# Patient Record
Sex: Female | Born: 1950 | Race: White | Hispanic: No | Smoking: Former smoker
Health system: Southern US, Community
[De-identification: ages and names within clinical notes are randomized; demographics above are authoritative.]

## PROBLEM LIST (undated history)

## (undated) DIAGNOSIS — K759 Inflammatory liver disease, unspecified: Secondary | ICD-10-CM

## (undated) DIAGNOSIS — Z8679 Personal history of other diseases of the circulatory system: Secondary | ICD-10-CM

## (undated) DIAGNOSIS — K7460 Unspecified cirrhosis of liver: Secondary | ICD-10-CM

## (undated) DIAGNOSIS — F329 Major depressive disorder, single episode, unspecified: Secondary | ICD-10-CM

## (undated) DIAGNOSIS — Z972 Presence of dental prosthetic device (complete) (partial): Secondary | ICD-10-CM

## (undated) DIAGNOSIS — IMO0002 Reserved for concepts with insufficient information to code with codable children: Secondary | ICD-10-CM

## (undated) DIAGNOSIS — G4700 Insomnia, unspecified: Secondary | ICD-10-CM

## (undated) DIAGNOSIS — D126 Benign neoplasm of colon, unspecified: Secondary | ICD-10-CM

## (undated) DIAGNOSIS — G988 Other disorders of nervous system: Secondary | ICD-10-CM

## (undated) DIAGNOSIS — E785 Hyperlipidemia, unspecified: Secondary | ICD-10-CM

## (undated) DIAGNOSIS — R109 Unspecified abdominal pain: Secondary | ICD-10-CM

## (undated) DIAGNOSIS — I10 Essential (primary) hypertension: Secondary | ICD-10-CM

## (undated) DIAGNOSIS — F319 Bipolar disorder, unspecified: Secondary | ICD-10-CM

## (undated) DIAGNOSIS — F419 Anxiety disorder, unspecified: Secondary | ICD-10-CM

## (undated) DIAGNOSIS — E119 Type 2 diabetes mellitus without complications: Secondary | ICD-10-CM

## (undated) DIAGNOSIS — J45909 Unspecified asthma, uncomplicated: Secondary | ICD-10-CM

## (undated) DIAGNOSIS — F32A Depression, unspecified: Secondary | ICD-10-CM

## (undated) DIAGNOSIS — R600 Localized edema: Secondary | ICD-10-CM

## (undated) DIAGNOSIS — G4733 Obstructive sleep apnea (adult) (pediatric): Secondary | ICD-10-CM

## (undated) DIAGNOSIS — D696 Thrombocytopenia, unspecified: Secondary | ICD-10-CM

## (undated) DIAGNOSIS — I7100 Dissection of unspecified site of aorta: Secondary | ICD-10-CM

## (undated) DIAGNOSIS — D649 Anemia, unspecified: Secondary | ICD-10-CM

## (undated) DIAGNOSIS — G2581 Restless legs syndrome: Secondary | ICD-10-CM

## (undated) DIAGNOSIS — Z5189 Encounter for other specified aftercare: Secondary | ICD-10-CM

## (undated) DIAGNOSIS — K589 Irritable bowel syndrome without diarrhea: Secondary | ICD-10-CM

## (undated) DIAGNOSIS — G4730 Sleep apnea, unspecified: Secondary | ICD-10-CM

## (undated) DIAGNOSIS — M1990 Unspecified osteoarthritis, unspecified site: Secondary | ICD-10-CM

## (undated) DIAGNOSIS — R0602 Shortness of breath: Secondary | ICD-10-CM

## (undated) DIAGNOSIS — K760 Fatty (change of) liver, not elsewhere classified: Secondary | ICD-10-CM

## (undated) DIAGNOSIS — G8929 Other chronic pain: Secondary | ICD-10-CM

## (undated) DIAGNOSIS — Z8673 Personal history of transient ischemic attack (TIA), and cerebral infarction without residual deficits: Secondary | ICD-10-CM

## (undated) DIAGNOSIS — G629 Polyneuropathy, unspecified: Secondary | ICD-10-CM

## (undated) DIAGNOSIS — I639 Cerebral infarction, unspecified: Secondary | ICD-10-CM

## (undated) DIAGNOSIS — K219 Gastro-esophageal reflux disease without esophagitis: Secondary | ICD-10-CM

## (undated) DIAGNOSIS — K635 Polyp of colon: Secondary | ICD-10-CM

## (undated) DIAGNOSIS — R32 Unspecified urinary incontinence: Secondary | ICD-10-CM

## (undated) DIAGNOSIS — J449 Chronic obstructive pulmonary disease, unspecified: Secondary | ICD-10-CM

## (undated) DIAGNOSIS — C801 Malignant (primary) neoplasm, unspecified: Secondary | ICD-10-CM

## (undated) HISTORY — DX: Fatty (change of) liver, not elsewhere classified: K76.0

## (undated) HISTORY — DX: Encounter for other specified aftercare: Z51.89

## (undated) HISTORY — PX: COLONOSCOPY: SHX174

## (undated) HISTORY — DX: Type 2 diabetes mellitus without complications: E11.9

## (undated) HISTORY — DX: Anxiety disorder, unspecified: F41.9

## (undated) HISTORY — PX: ABDOMINAL EXPLORATION SURGERY: SHX538

## (undated) HISTORY — DX: Other disorders of nervous system: G98.8

## (undated) HISTORY — DX: Polyp of colon: K63.5

## (undated) HISTORY — PX: ROTATOR CUFF REPAIR: SHX139

## (undated) HISTORY — PX: HEMORROIDECTOMY: SUR656

## (undated) HISTORY — PX: ABDOMINAL HYSTERECTOMY: SHX81

## (undated) HISTORY — DX: Essential (primary) hypertension: I10

## (undated) HISTORY — DX: Other chronic pain: G89.29

## (undated) HISTORY — DX: Chronic obstructive pulmonary disease, unspecified: J44.9

## (undated) HISTORY — PX: BACK SURGERY: SHX140

## (undated) HISTORY — DX: Cerebral infarction, unspecified: I63.9

## (undated) HISTORY — DX: Unspecified urinary incontinence: R32

## (undated) HISTORY — DX: Dissection of unspecified site of aorta: I71.00

## (undated) HISTORY — DX: Bipolar disorder, unspecified: F31.9

## (undated) HISTORY — DX: Depression, unspecified: F32.A

## (undated) HISTORY — DX: Thrombocytopenia, unspecified: D69.6

## (undated) HISTORY — DX: Personal history of other diseases of the circulatory system: Z86.79

## (undated) HISTORY — DX: Personal history of transient ischemic attack (TIA), and cerebral infarction without residual deficits: Z86.73

## (undated) HISTORY — PX: EYE SURGERY: SHX253

## (undated) HISTORY — PX: OTHER SURGICAL HISTORY: SHX169

## (undated) HISTORY — DX: Anemia, unspecified: D64.9

## (undated) HISTORY — DX: Unspecified abdominal pain: R10.9

## (undated) HISTORY — PX: CHOLECYSTECTOMY: SHX55

## (undated) HISTORY — DX: Hyperlipidemia, unspecified: E78.5

## (undated) HISTORY — DX: Obstructive sleep apnea (adult) (pediatric): G47.33

## (undated) HISTORY — DX: Major depressive disorder, single episode, unspecified: F32.9

## (undated) HISTORY — DX: Insomnia, unspecified: G47.00

## (undated) HISTORY — DX: Benign neoplasm of colon, unspecified: D12.6

## (undated) HISTORY — DX: Restless legs syndrome: G25.81

## (undated) HISTORY — DX: Unspecified cirrhosis of liver: K74.60

## (undated) HISTORY — PX: POSTERIOR CERVICAL FUSION/FORAMINOTOMY: SHX5038

## (undated) HISTORY — DX: Sleep apnea, unspecified: G47.30

## (undated) HISTORY — DX: Reserved for concepts with insufficient information to code with codable children: IMO0002

## (undated) HISTORY — DX: Unspecified asthma, uncomplicated: J45.909

---

## 1998-04-07 ENCOUNTER — Encounter: Payer: Self-pay | Admitting: Emergency Medicine

## 1998-04-07 ENCOUNTER — Emergency Department (HOSPITAL_COMMUNITY): Admission: EM | Admit: 1998-04-07 | Discharge: 1998-04-07 | Payer: Self-pay | Admitting: Emergency Medicine

## 1998-06-06 DIAGNOSIS — IMO0001 Reserved for inherently not codable concepts without codable children: Secondary | ICD-10-CM

## 1998-06-06 DIAGNOSIS — Z5189 Encounter for other specified aftercare: Secondary | ICD-10-CM

## 1998-06-06 HISTORY — DX: Reserved for inherently not codable concepts without codable children: IMO0001

## 1998-06-06 HISTORY — DX: Encounter for other specified aftercare: Z51.89

## 1999-05-23 ENCOUNTER — Inpatient Hospital Stay (HOSPITAL_COMMUNITY): Admission: EM | Admit: 1999-05-23 | Discharge: 1999-05-26 | Payer: Self-pay | Admitting: *Deleted

## 1999-05-27 ENCOUNTER — Other Ambulatory Visit: Admission: RE | Admit: 1999-05-27 | Discharge: 1999-06-09 | Payer: Self-pay

## 1999-06-25 ENCOUNTER — Inpatient Hospital Stay (HOSPITAL_COMMUNITY): Admission: AD | Admit: 1999-06-25 | Discharge: 1999-06-28 | Payer: Self-pay | Admitting: *Deleted

## 1999-06-29 ENCOUNTER — Other Ambulatory Visit: Admission: RE | Admit: 1999-06-29 | Discharge: 1999-07-05 | Payer: Self-pay

## 2000-04-02 ENCOUNTER — Inpatient Hospital Stay (HOSPITAL_COMMUNITY): Admission: EM | Admit: 2000-04-02 | Discharge: 2000-04-06 | Payer: Self-pay | Admitting: *Deleted

## 2004-09-24 ENCOUNTER — Other Ambulatory Visit: Payer: Self-pay

## 2004-09-24 ENCOUNTER — Emergency Department: Payer: Self-pay | Admitting: Internal Medicine

## 2004-09-27 ENCOUNTER — Ambulatory Visit: Payer: Self-pay | Admitting: Internal Medicine

## 2004-09-28 ENCOUNTER — Ambulatory Visit: Payer: Self-pay | Admitting: Internal Medicine

## 2004-10-07 ENCOUNTER — Emergency Department: Payer: Self-pay | Admitting: General Practice

## 2004-12-16 ENCOUNTER — Encounter: Payer: Self-pay | Admitting: Physical Medicine & Rehabilitation

## 2005-01-04 ENCOUNTER — Encounter: Payer: Self-pay | Admitting: Physical Medicine & Rehabilitation

## 2005-01-13 ENCOUNTER — Ambulatory Visit: Payer: Self-pay | Admitting: Internal Medicine

## 2005-02-11 ENCOUNTER — Ambulatory Visit: Payer: Self-pay | Admitting: Internal Medicine

## 2005-02-17 ENCOUNTER — Emergency Department: Payer: Self-pay | Admitting: Emergency Medicine

## 2005-02-18 ENCOUNTER — Ambulatory Visit: Payer: Self-pay | Admitting: Internal Medicine

## 2005-03-22 ENCOUNTER — Ambulatory Visit: Payer: Self-pay | Admitting: Internal Medicine

## 2005-05-04 ENCOUNTER — Ambulatory Visit: Payer: Self-pay | Admitting: Internal Medicine

## 2005-05-12 ENCOUNTER — Ambulatory Visit: Payer: Self-pay | Admitting: Emergency Medicine

## 2005-05-15 IMAGING — CT CT HEAD WITHOUT CONTRAST
1 series · 15 of 30 positions shown, 19 images · non-contrast
Comparison: none

REASON FOR EXAM: s/p fall [DATE] hit head with laceration now with h/a
problems walking and running
COMMENTS:

[Series 2: head injury 5.0 h40s · axial · 0.39mm/px · z∈[-160,-25]mm · 15 of 31 slices shown, 19 images]
[im 2/31  brain]
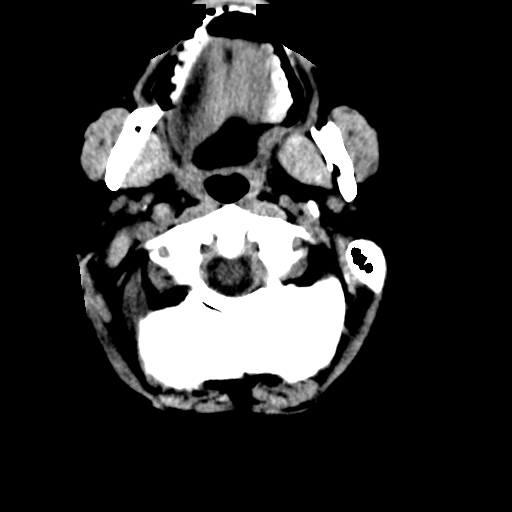
[im 2/31  bone]
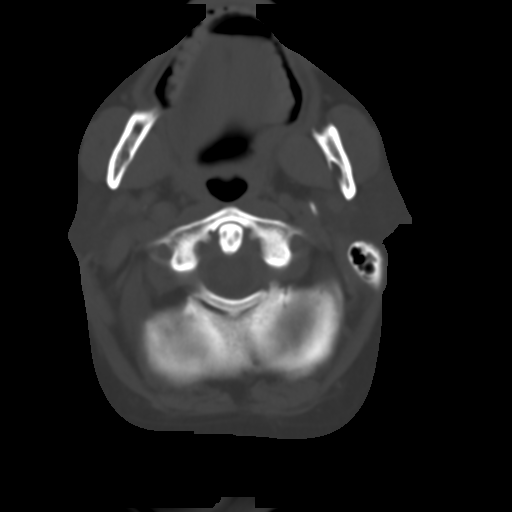
[im 4/31  brain]
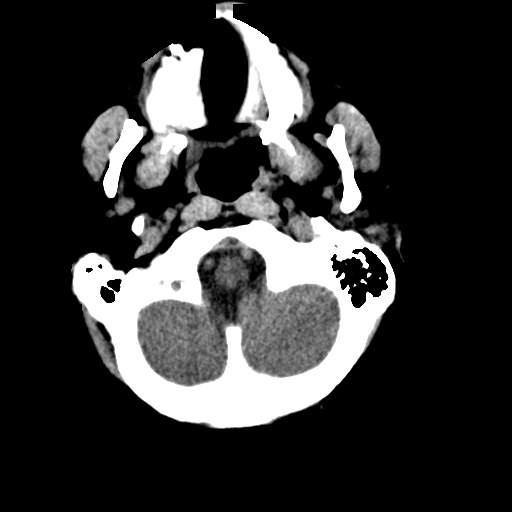
[im 6/31  brain]
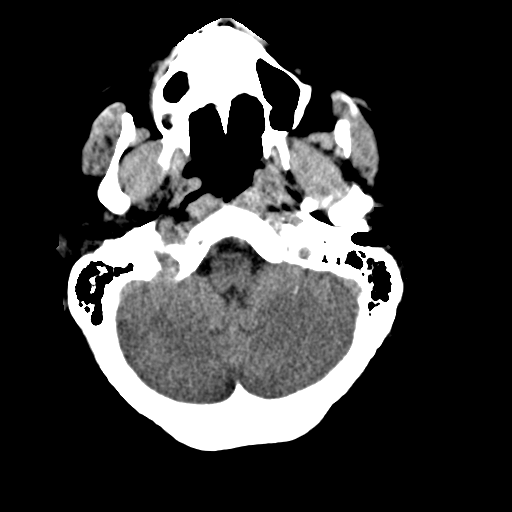
[im 8/31  brain]
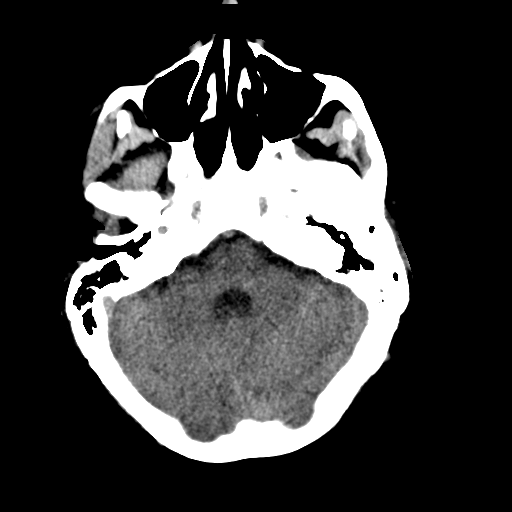
[im 10/31  brain]
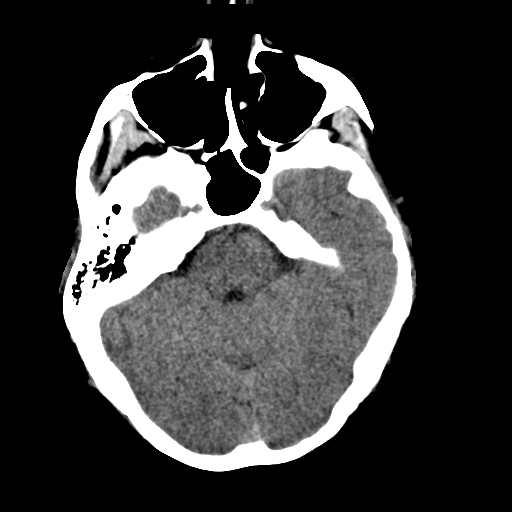
[im 10/31  bone]
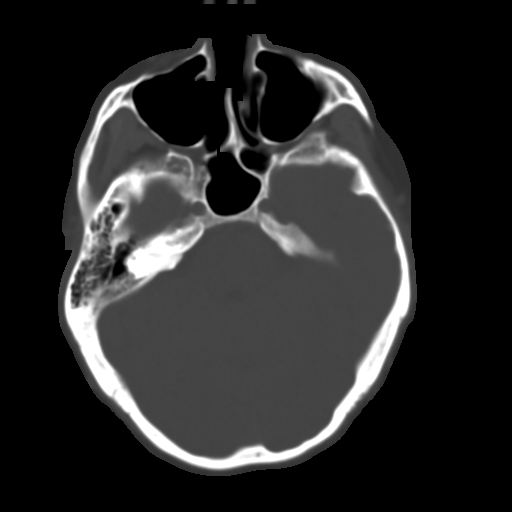
[im 12/31  brain]
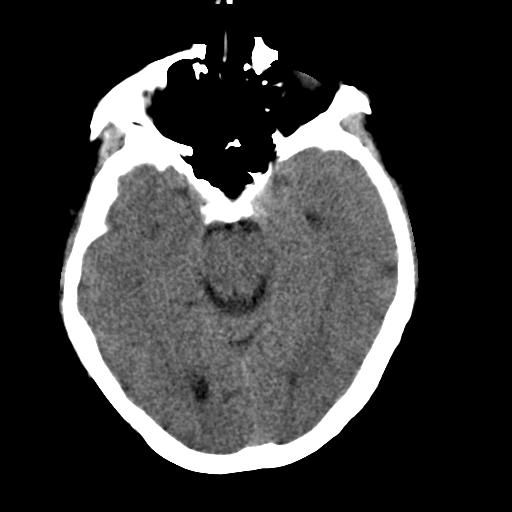
[im 14/31  brain]
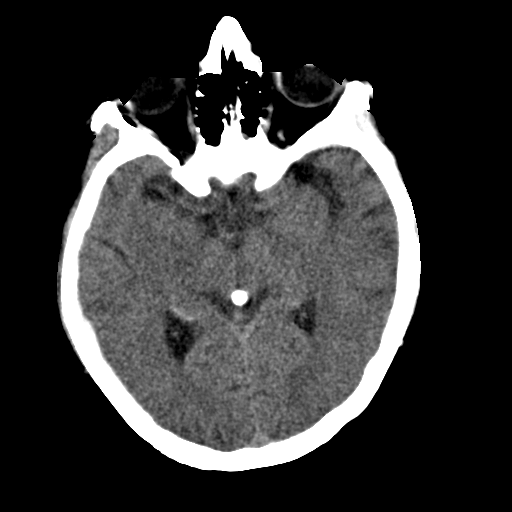
[im 16/31  brain]
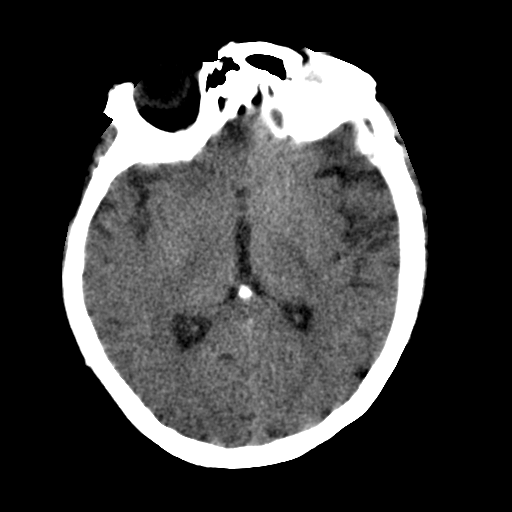
[im 17/31  brain]
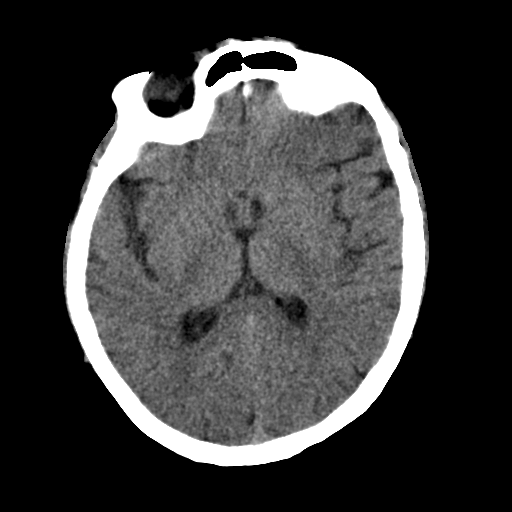
[im 17/31  bone]
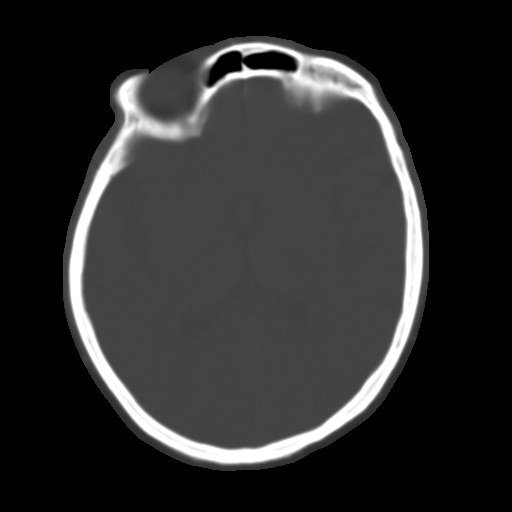
[im 19/31  brain]
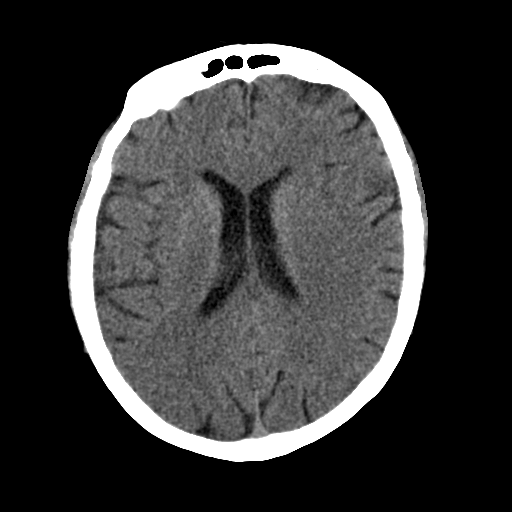
[im 21/31  brain]
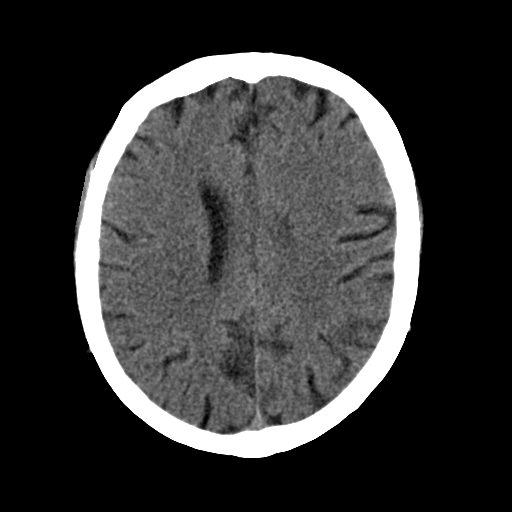
[im 23/31  brain]
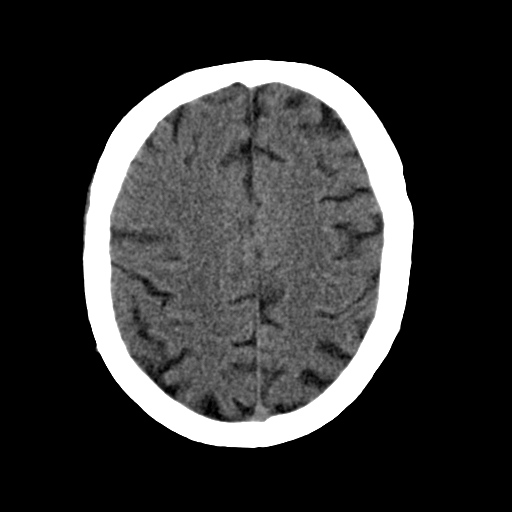
[im 25/31  brain]
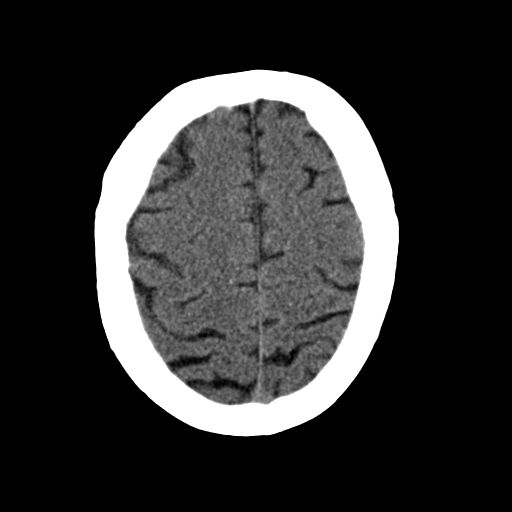
[im 25/31  bone]
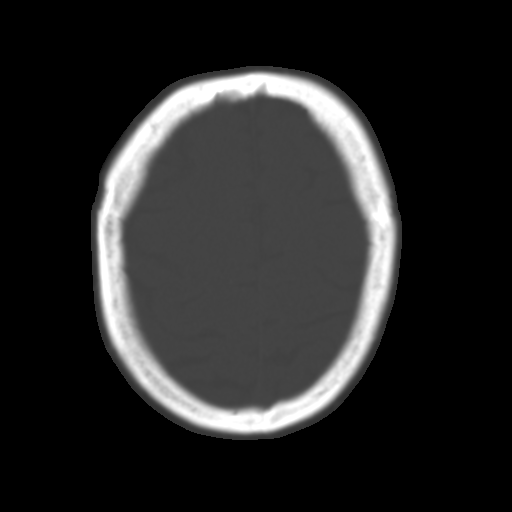
[im 27/31  brain]
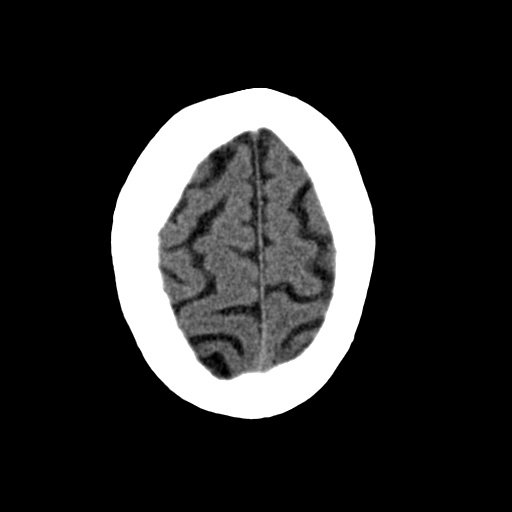
[im 29/31  brain]
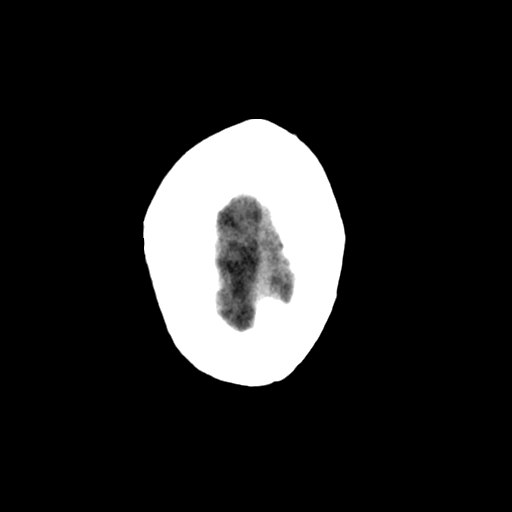

[15 of 30 positions shown; findings below may reference images not displayed]

PROCEDURE:     CT  - CT HEAD WITHOUT CONTRAST  - September 24, 2004 [DATE]

RESULT:         The patient has sustained a fall approximately one week ago
and now is experiencing gait difficulty.

The ventricles are normal in size and position.  There is no evidence of a
mass nor mass effect.  On image 13 there is a very subtle hypodensity in the
LEFT cerebral peduncle.   This is of questionable significance and is seen
on one image only.  There is no evidence of an intracranial hemorrhage or
evidence of mass effect or hydrocephalus.  At bone window settings,  I do
not see evidence of a skull fracture.  The visualized portions of the
paranasal sinuses exhibit no air-fluid levels.
IMPRESSION: 1.     I do not see objective evidence of an intracranial hemorrhage nor
other acute intracranial abnormality.
2.     On image 13 there is a very subtle area of decreased density in the
LEFT cerebral peduncle that merits further evaluation with MRI to ensure
that this is not an area of ischemia or another process.

The findings were called to Dr. Atitat in the Emergency Room at the
conclusion of the study.

## 2005-05-15 IMAGING — CT CT CERVICAL SPINE WITHOUT CONTRAST
3 of 4 series · 16 of 33 positions shown, 19 images · non-contrast
Comparison: none

REASON FOR EXAM: Fall,  pain
COMMENTS:

[Series 3: inspace · axial · 0.39mm/px · z∈[+93,+245]mm · 8 of 273 slices shown, 10 images]
[im 28/273  soft-tissue]
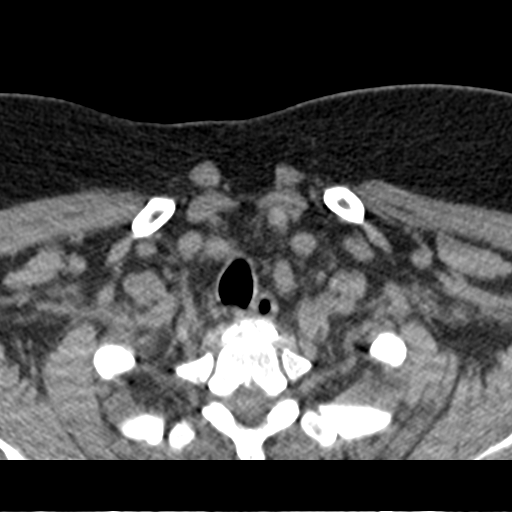
[im 28/273  bone]
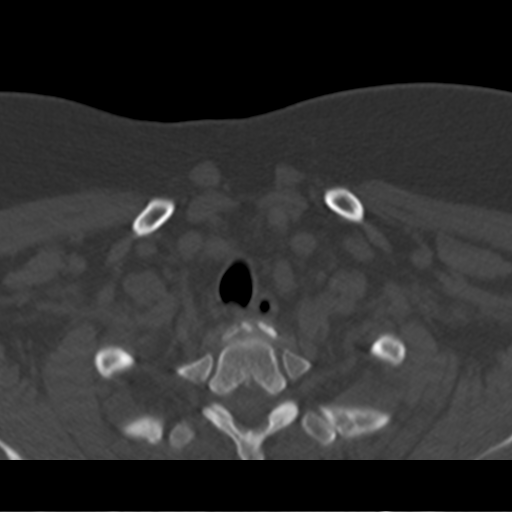
[im 55/273  bone]
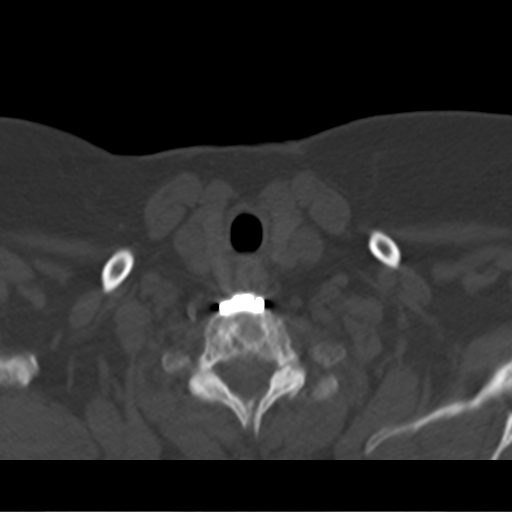
[im 82/273  bone]
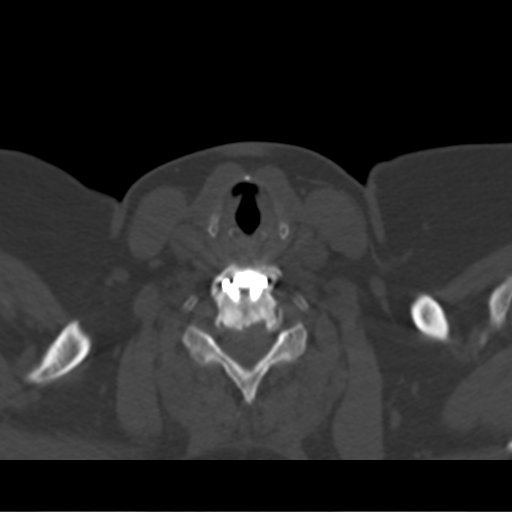
[im 109/273  bone]
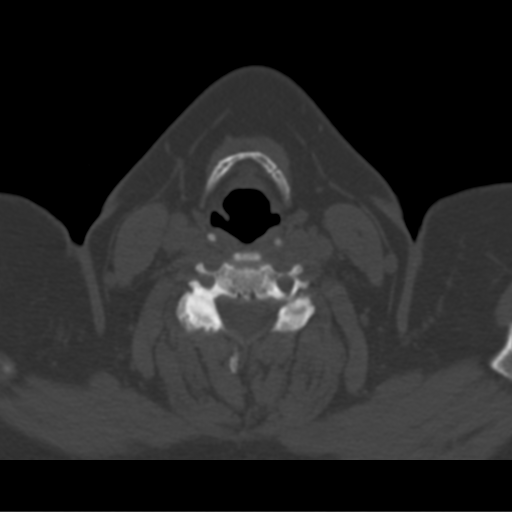
[im 164/273  soft-tissue]
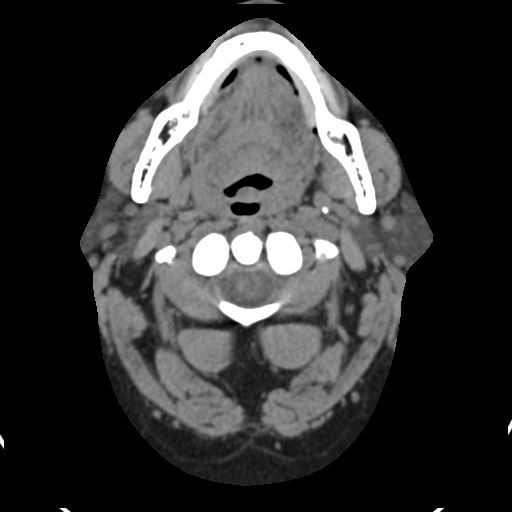
[im 164/273  bone]
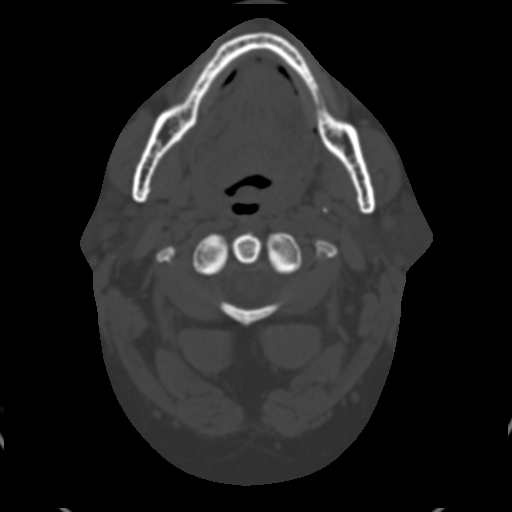
[im 191/273  bone]
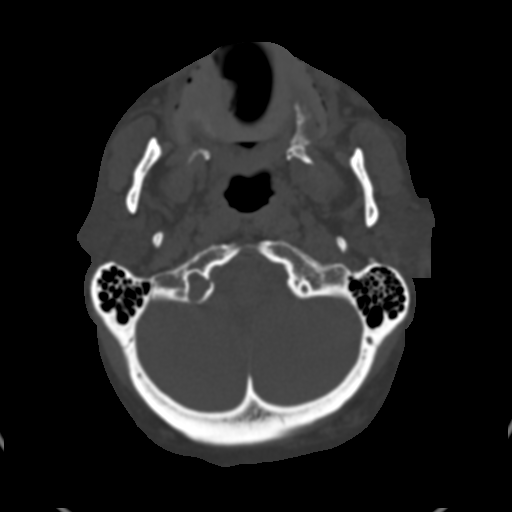
[im 218/273  bone]
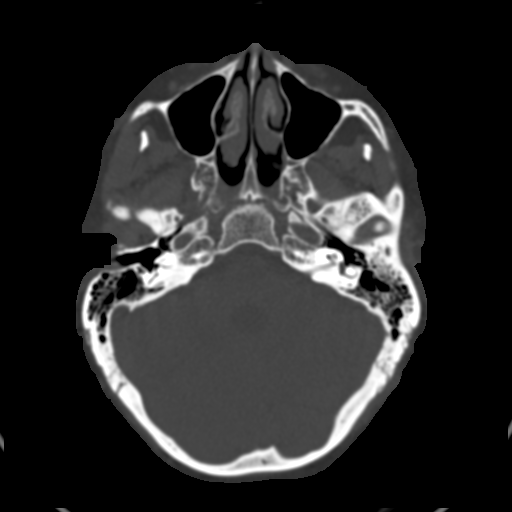
[im 245/273  bone]
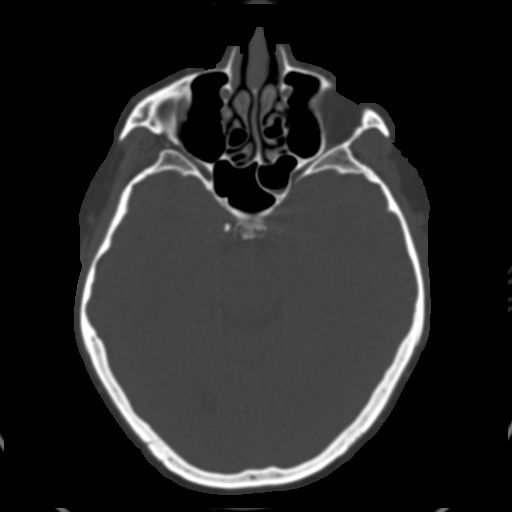

[Series 5: coronal · coronal · 0.33mm/px · 3 of 48 slices shown]
[im 10/48  bone]
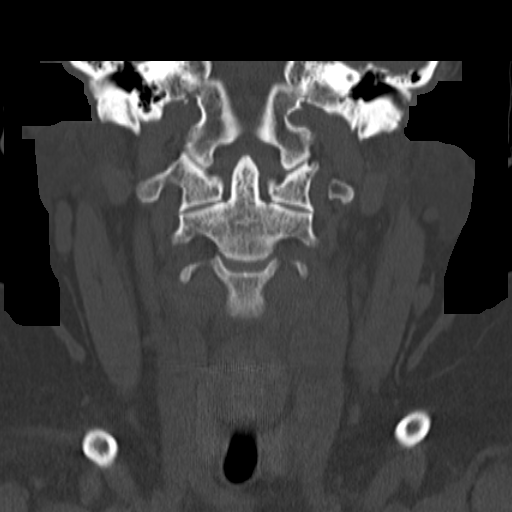
[im 19/48  bone]
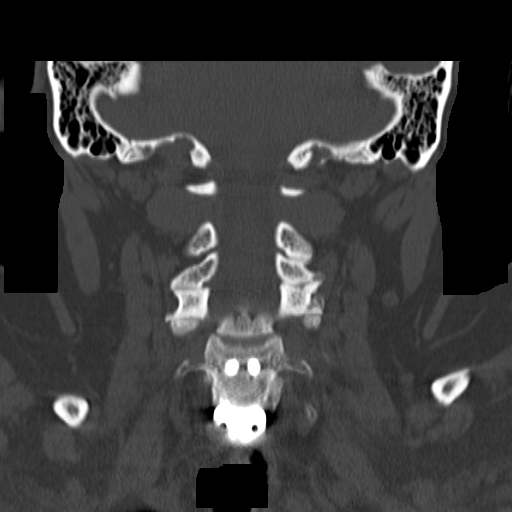
[im 29/48  bone]
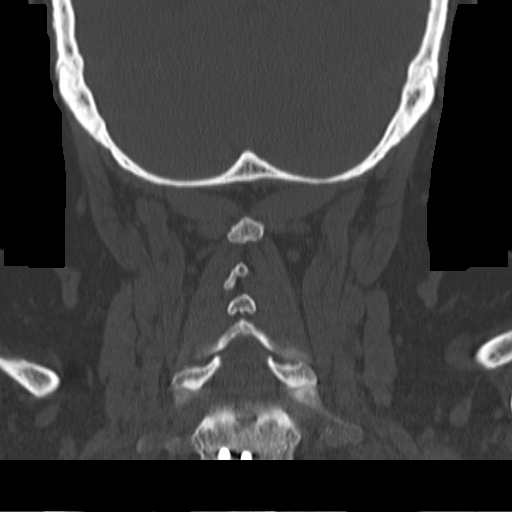

[Series 7: sagital · sagittal · 0.34mm/px · 5 of 36 slices shown, 6 images]
[im 12/36  bone]
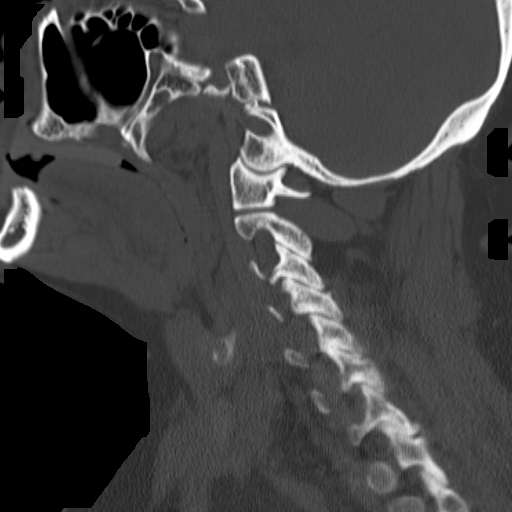
[im 15/36  bone]
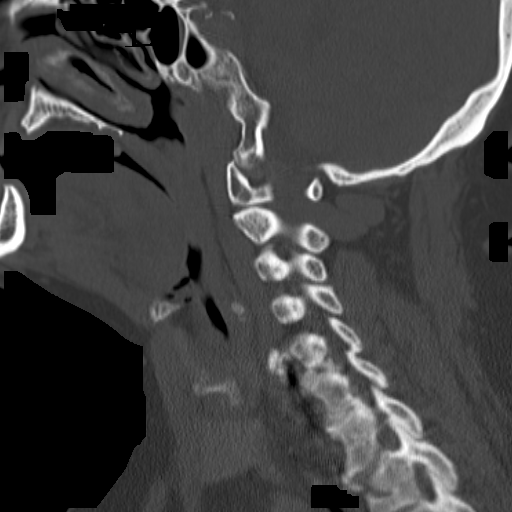
[im 18/36  soft-tissue]
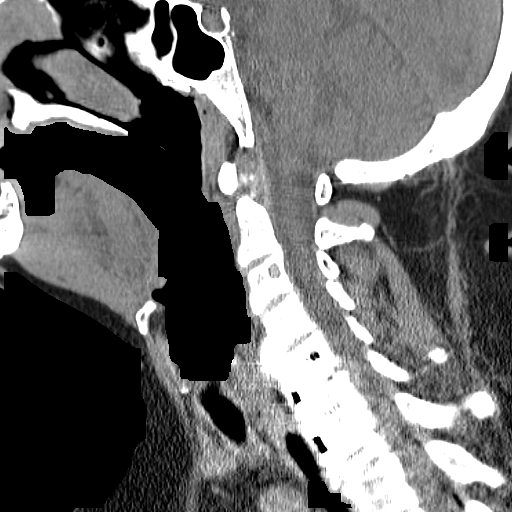
[im 18/36  bone]
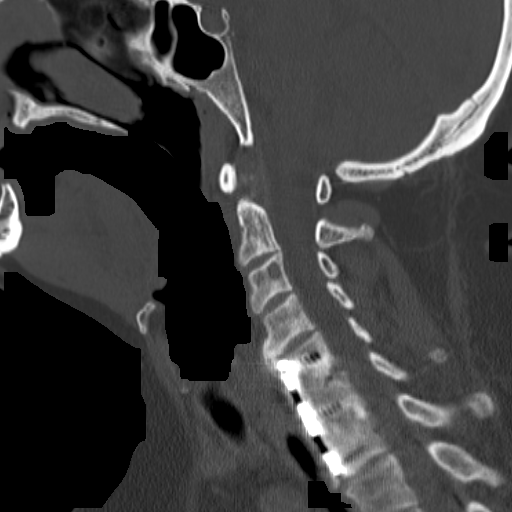
[im 21/36  bone]
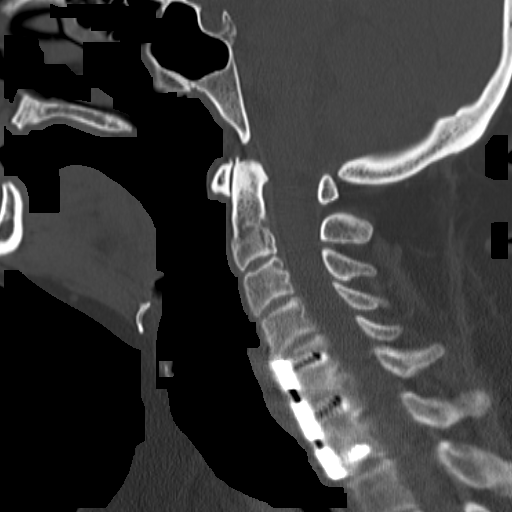
[im 24/36  bone]
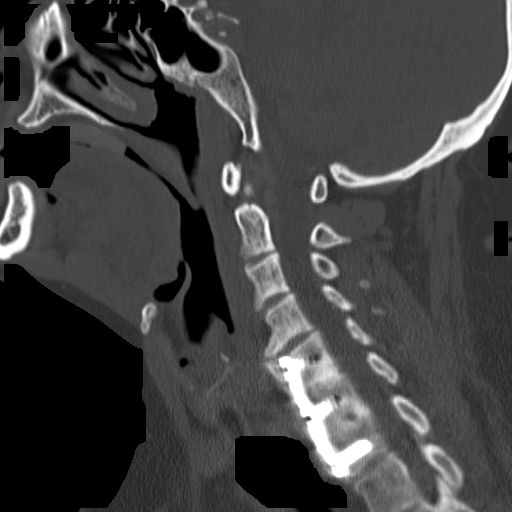

[16 of 33 positions shown; findings below may reference images not displayed]

PROCEDURE:     CT  - CT CERVICAL SPINE WO  - September 24, 2004  [DATE]

RESULT:     The patient has sustained a fall and is complaining of neck and
LEFT shoulder discomfort.

Axial, coronal and sagittal images were interpreted.

The odontoid is intact. The lateral masses of C1 articulate normally with
those of C2. The atlanto-axial joint space appears normal. The cervical
vertebral bodies appear preserved in height. The patient has undergone
anterior fusion across the C5-6 and C6-7 disc spaces. Fine detail of the
disc space is not available here. There is metallic artifact present. I do
not see objective evidence of a retropulsed bony fragment. There is noted to
be an end-plate osteophyte at the C4-5 disc level anteriorly. The posterior
elements appear grossly intact. I see no bony spinal canal compromise.
IMPRESSION: 1.     The patient has undergone prior fusion across the C5-6 and C6-7 disc
spaces.
2.     I do not see objective evidence of an acute cervical spine fracture
nor of high-grade spinal stenosis. There is end-plate spurring at the C4-5
level anteriorly.

## 2005-05-15 IMAGING — CR CERVICAL SPINE - COMPLETE 4+ VIEW
1 series · 6 of 6 positions shown · non-contrast
Comparison: none

REASON FOR EXAM: Fall on [DATE], pain.
COMMENTS:

[Series 1: view not recorded · 0.17mm/px · 6 of 6 slices shown]
[im 1/6]
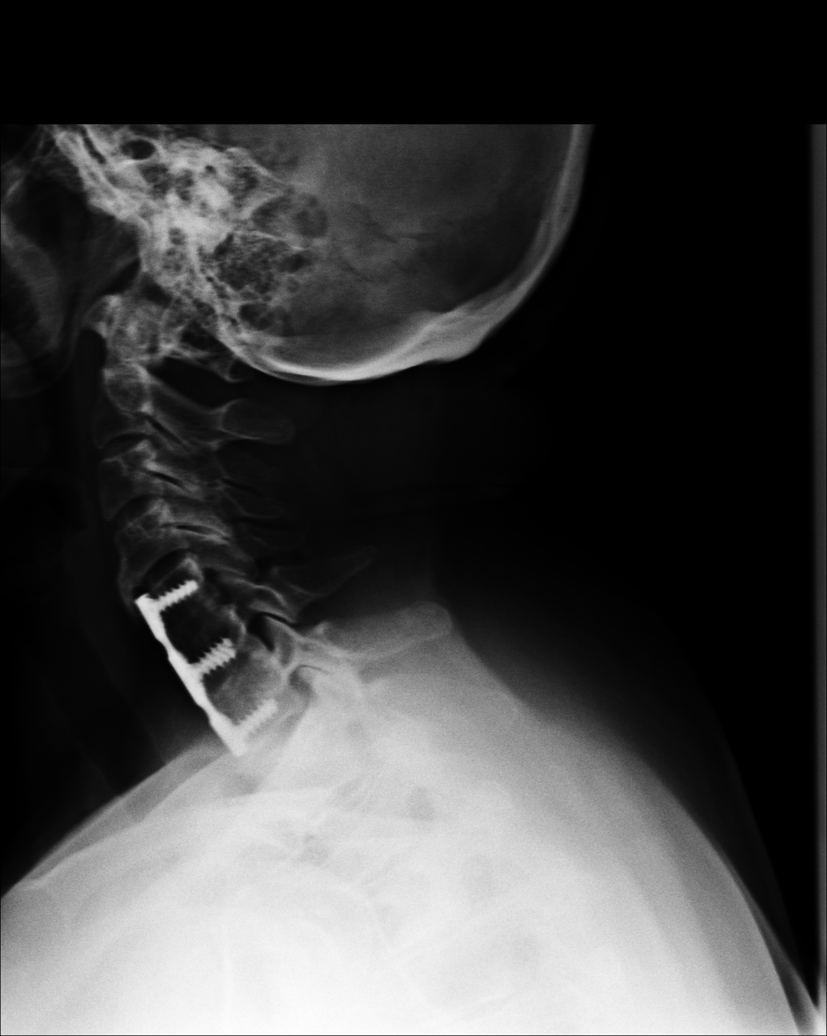
[im 2/6]
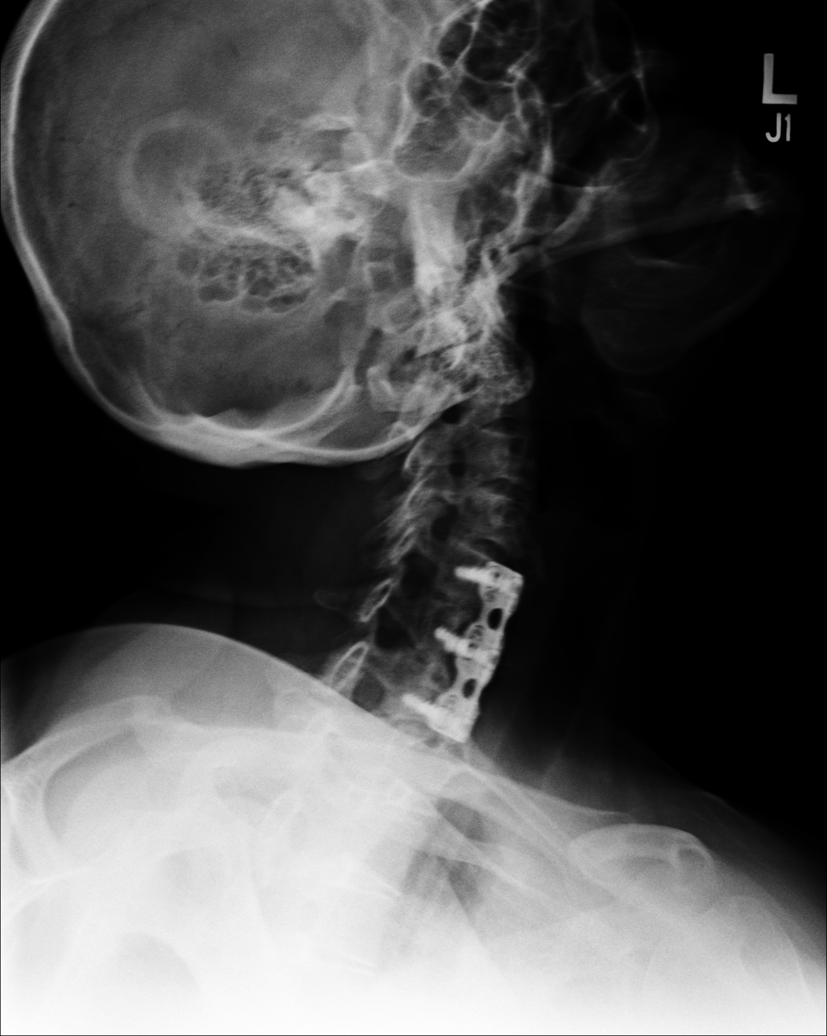
[im 3/6]
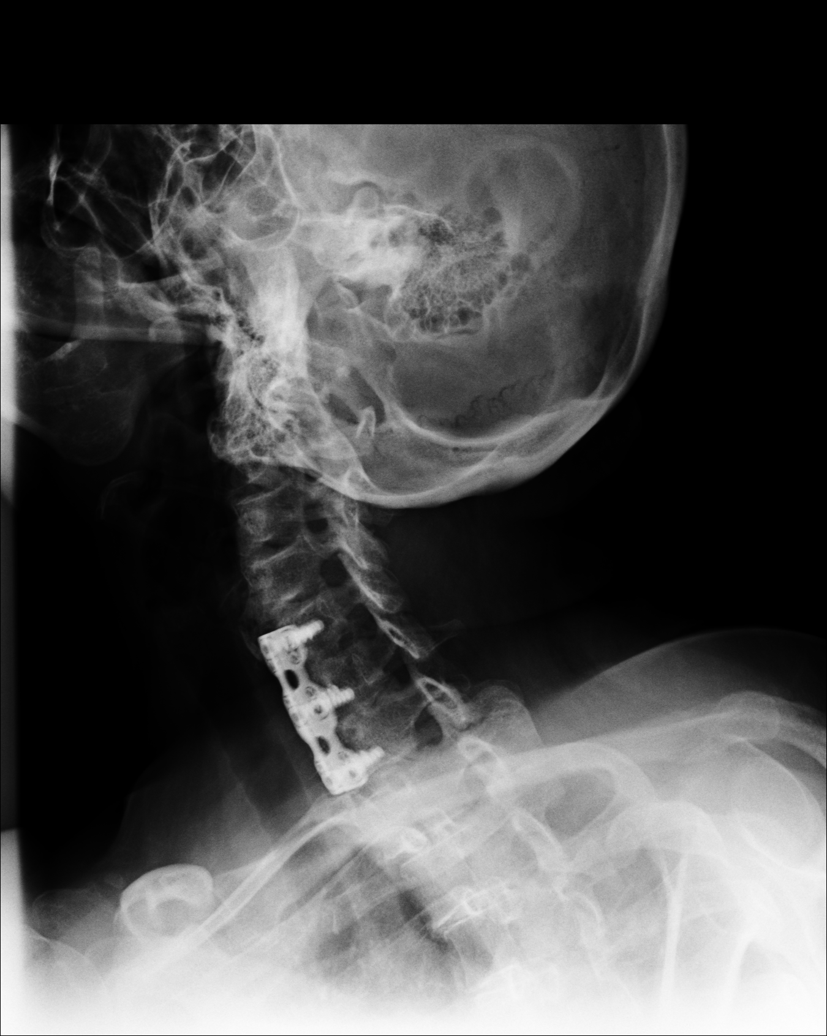
[im 4/6]
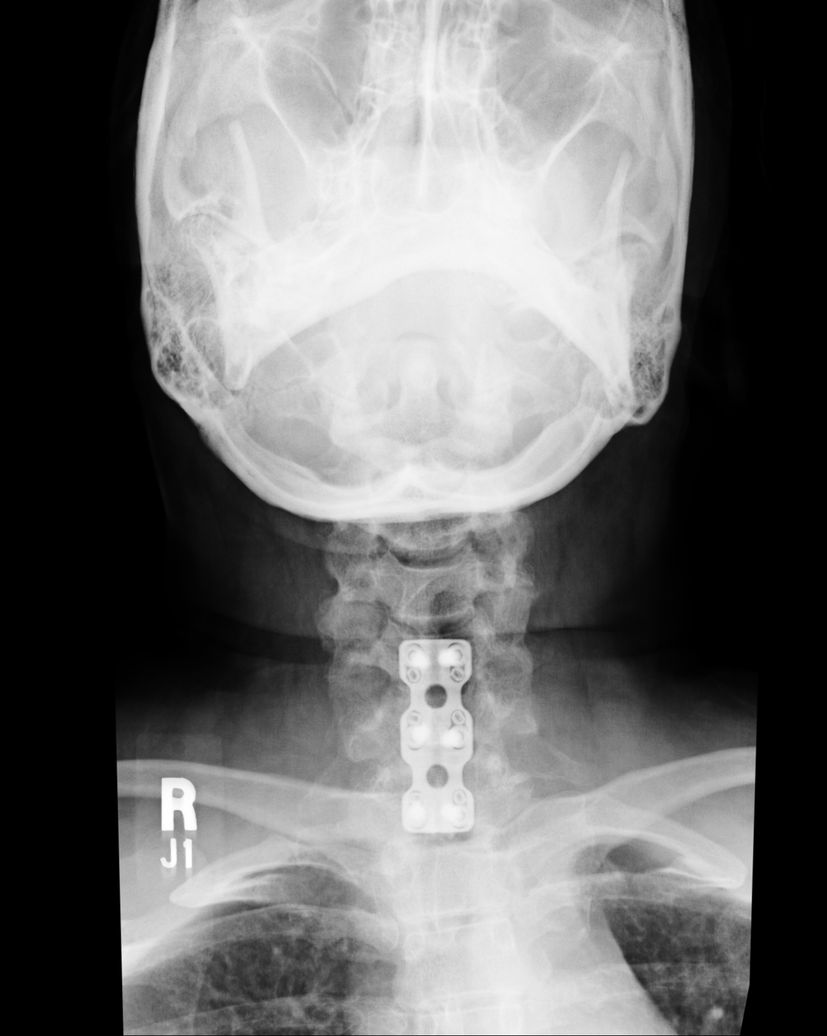
[im 5/6]
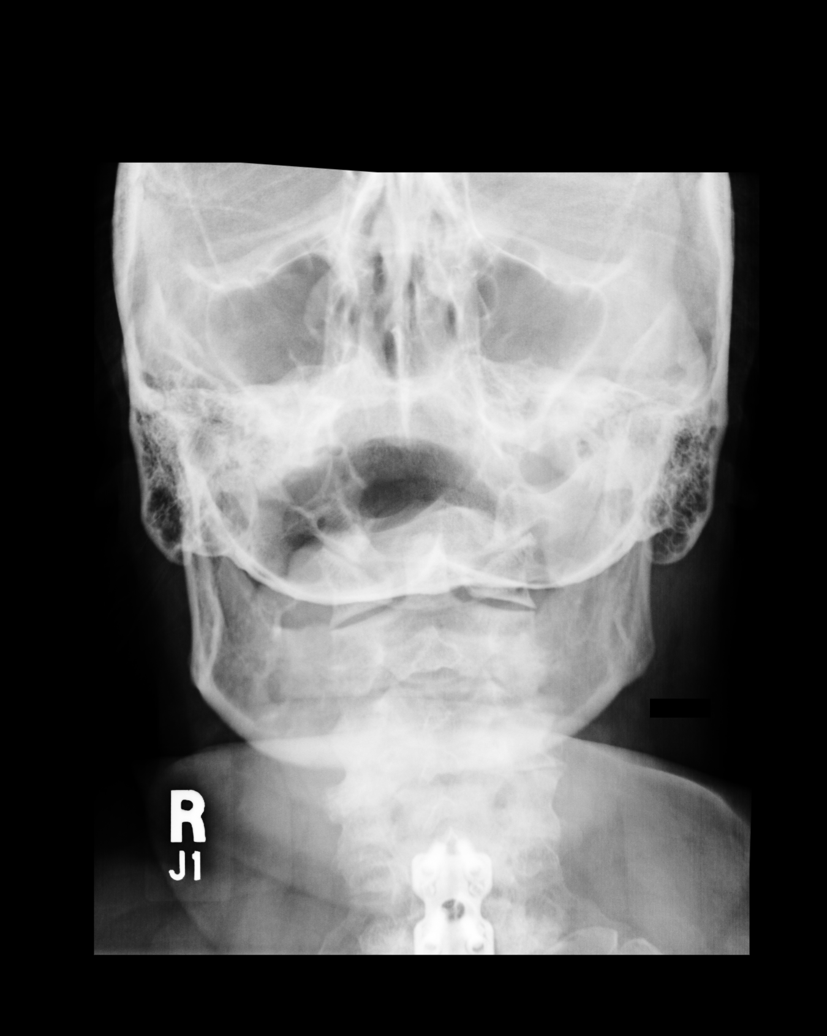
[im 6/6]
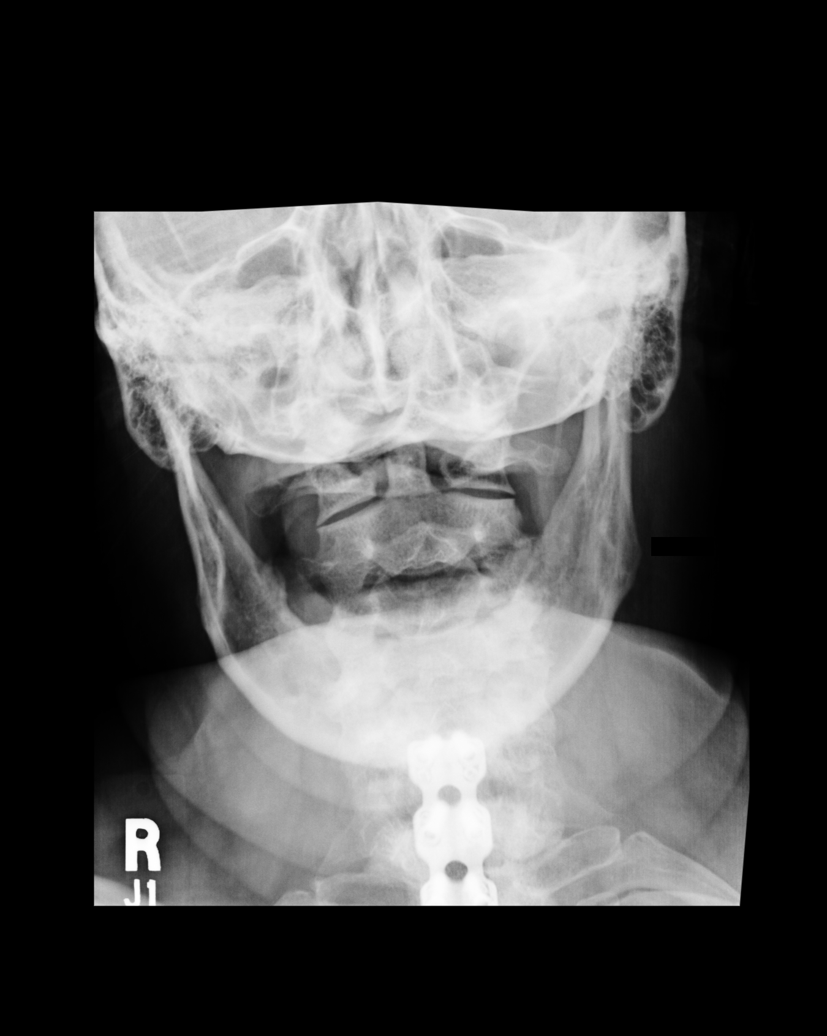

[6 of 6 positions shown; findings below may reference images not displayed]

PROCEDURE:     DXR - DXR CERVICAL SPINE COMPLETE  - September 24, 2004 [DATE]

RESULT:     AP, lateral and oblique views of the cervical spine show
postoperative changes compatible with prior anterior cervical fusion at C5,
C6, and C7.  The vertebral body alignment is normal.  The intervertebral
disk spaces superior to the areas of the fusion are well maintained.
Oblique views show the neural foramen to be widely patent bilaterally.  The
odontoid process is intact.
IMPRESSION: No acute changes are identified.

Postoperative changes of prior anterior cervical fusion are observed.

## 2005-05-18 IMAGING — MR MRI HEAD WITHOUT AND WITH CONTRAST
12 of 21 series · 31 of 48 positions shown · non-contrast
Comparison: none

REASON FOR EXAM: Status post fall, contusion [DATE].  Balance walking and
running
COMMENTS:

[Series 2: T1 · sagittal · 5.0mm · 0.43mm/px · 3 of 19 slices shown (1 of 4)]
[im 1/19]
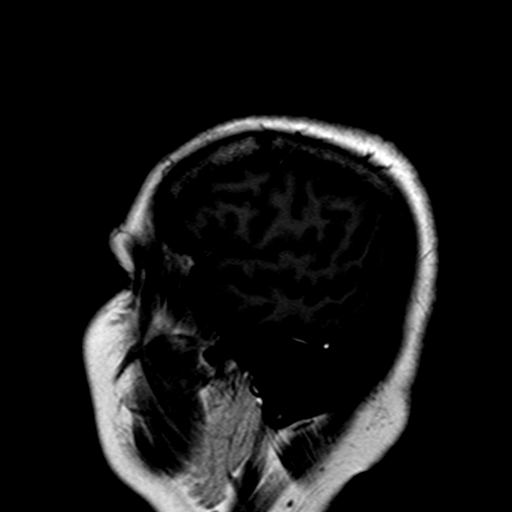
[im 10/19]
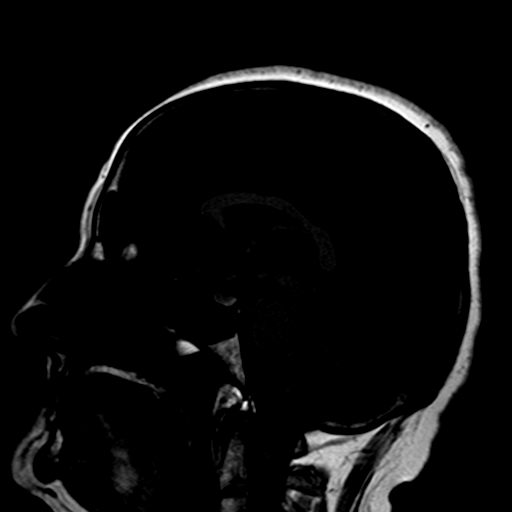
[im 19/19]
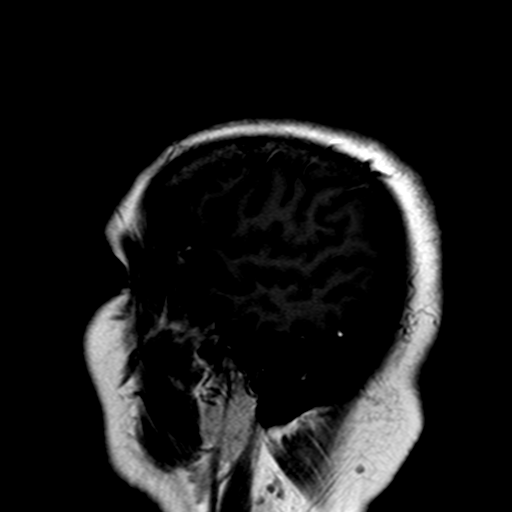

[Series 14: FLAIR · axial · 5.0mm · 0.43mm/px · z∈[-65,+61]mm · 3 of 23 slices shown (1 of 2)]
[im 1/23]
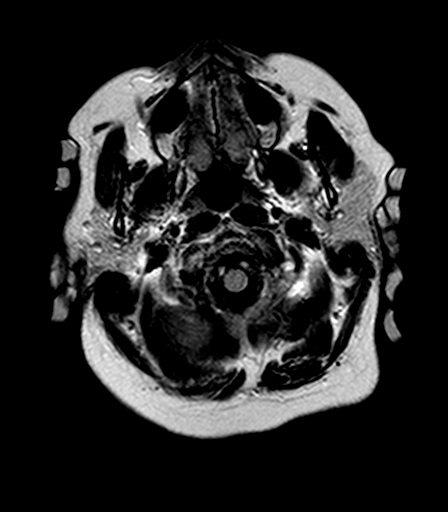
[im 12/23]
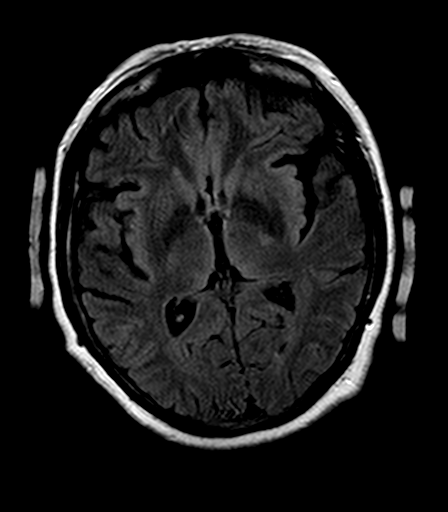
[im 23/23]
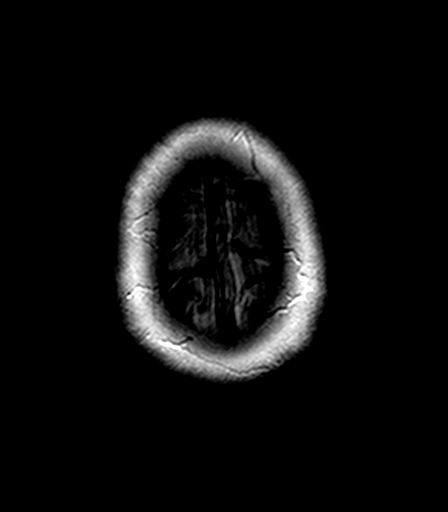

[Series 20: T1 · coronal · 5.0mm · 0.43mm/px · 4 of 23 slices shown (2 of 4)]
[im 1/23]
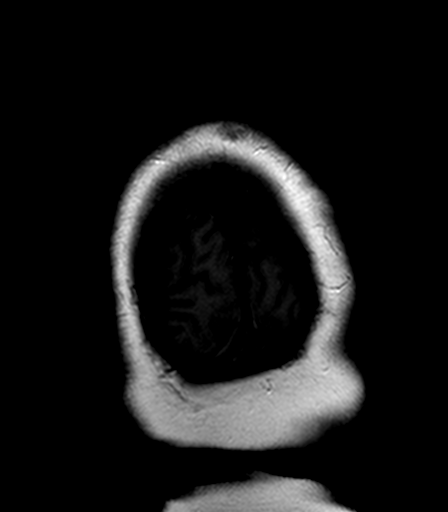
[im 8/23]
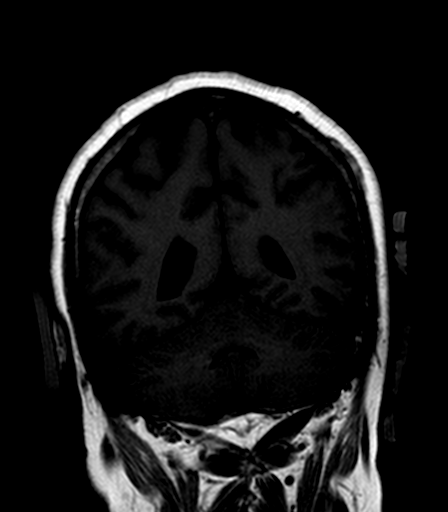
[im 15/23]
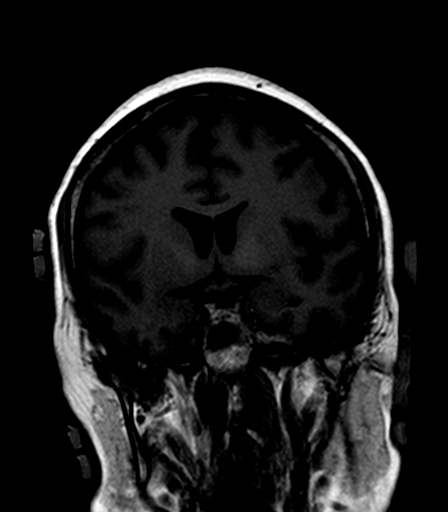
[im 23/23]
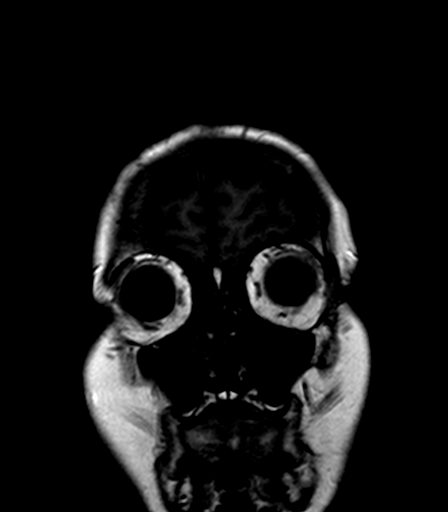

[Series 22: T1 · coronal · 5.0mm · 0.43mm/px · 4 of 23 slices shown (3 of 4)]
[im 1/23]
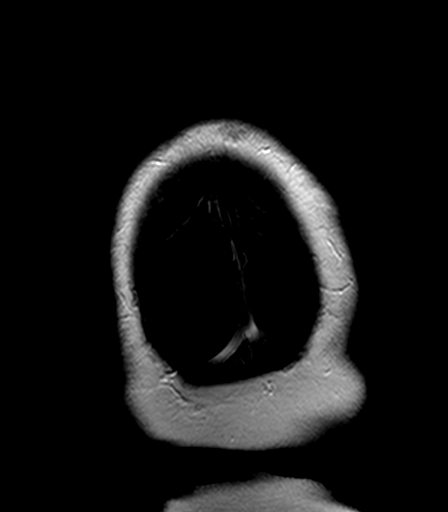
[im 8/23]
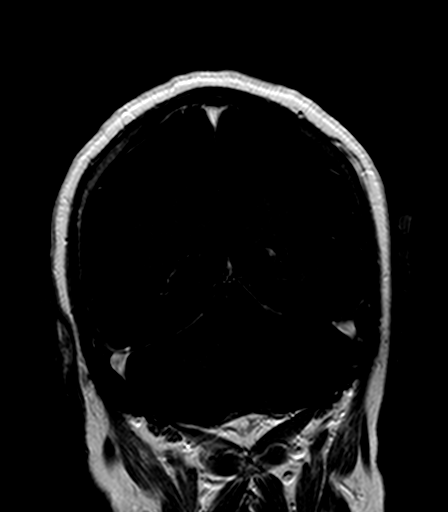
[im 15/23]
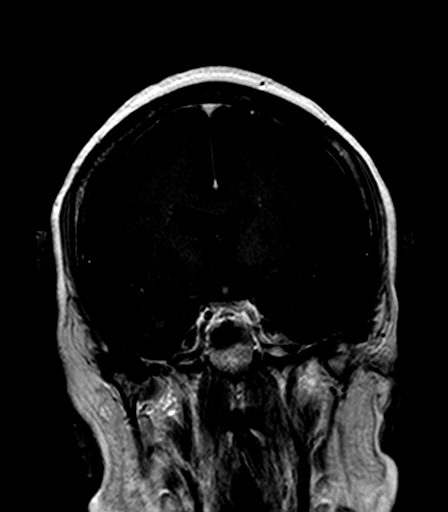
[im 23/23]
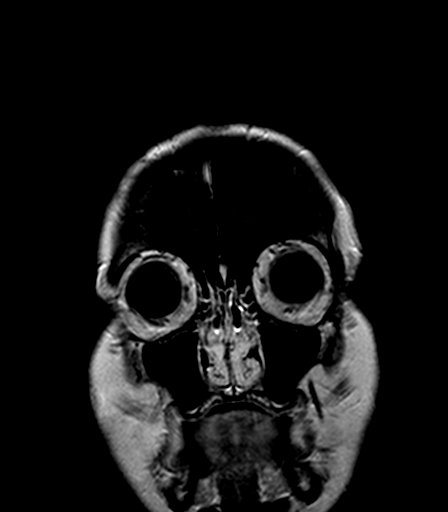

[Series 24: T1 · axial · 5.0mm · 0.86mm/px · 1 of 23 slices shown (4 of 4)]
[im 1/23]
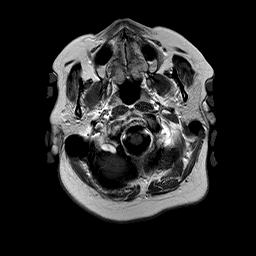

[Series 5002: DWI · axial · 5.0mm · 1.80mm/px · z∈[-59,+52]mm · 4 of 19 slices shown (1 of 2)]
[im 1/19]
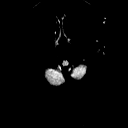
[im 7/19]
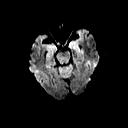
[im 13/19]
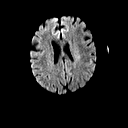
[im 19/19]
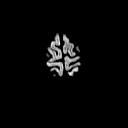

[Series 5003: ADC · axial · 5.0mm · 1.80mm/px · z∈[-59,+52]mm · 4 of 19 slices shown (1 of 2)]
[im 1/19]
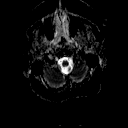
[im 7/19]
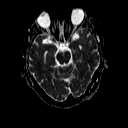
[im 13/19]
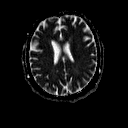
[im 19/19]
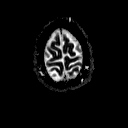

[Series 5004: DWI · axial · 10.0mm · 0.55mm/px · 1 of 3 slices shown (2 of 2)]
[im 1/3]
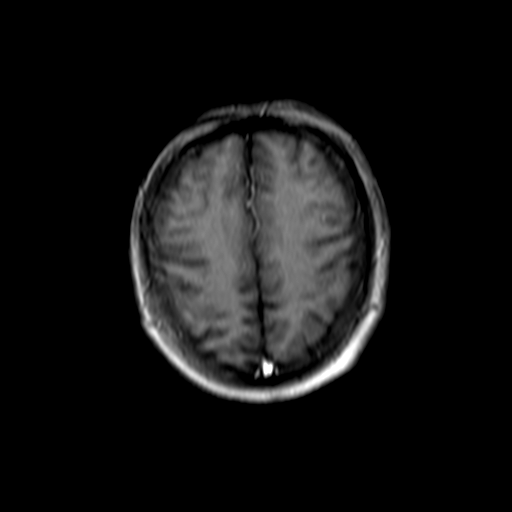

[Series 5005: ADC · axial · 10.0mm · 0.55mm/px · 1 of 3 slices shown (2 of 2)]
[im 1/3]
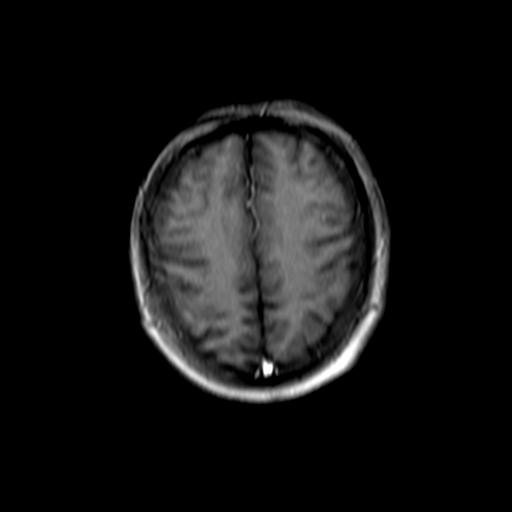

[Series 5008: T2 · axial · 5.0mm · 0.57mm/px · z∈[-65,+61]mm · 4 of 23 slices shown (1 of 2)]
[im 1/23]
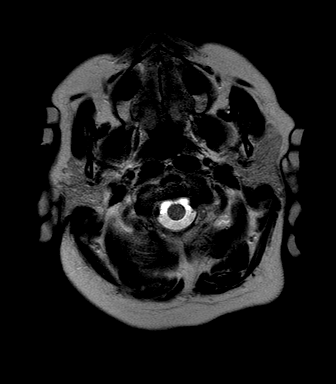
[im 8/23]
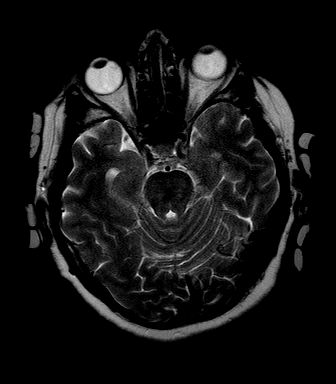
[im 15/23]
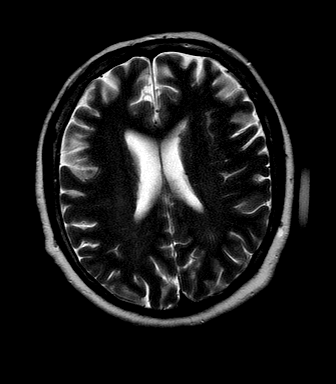
[im 23/23]
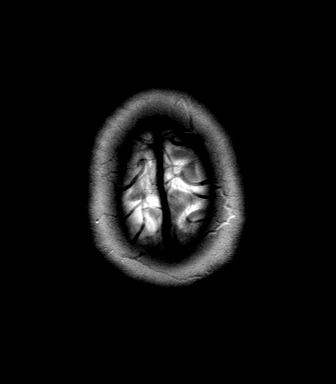

[Series 5010: T2 · axial · 10.0mm · 0.55mm/px · 1 of 3 slices shown (2 of 2)]
[im 1/3]
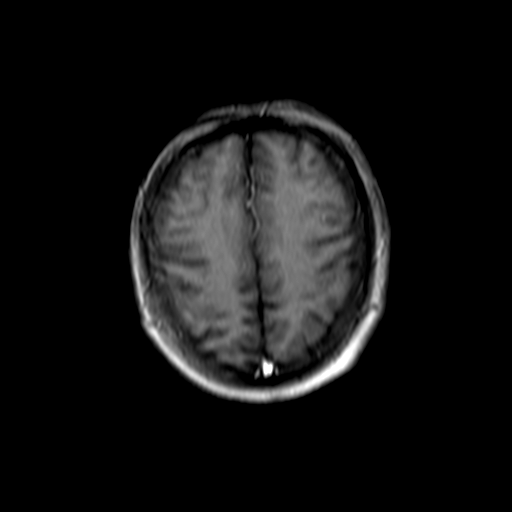

[Series 5012: FLAIR · axial · 10.0mm · 0.55mm/px · 1 of 3 slices shown (2 of 2)]
[im 1/3]
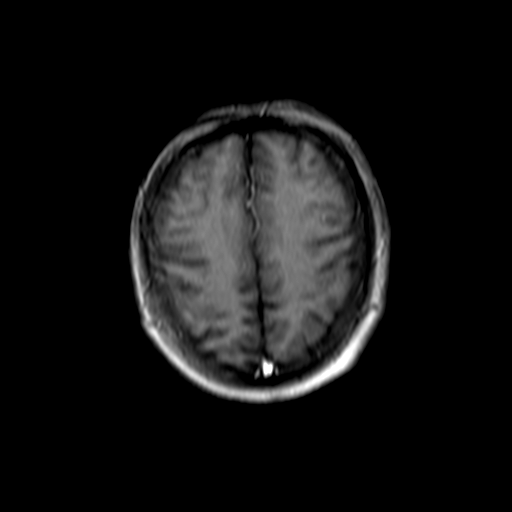

[31 of 48 positions shown; findings below may reference images not displayed]

PROCEDURE:     MR  - MR BRAIN WO/W CONTRAST  - September 27, 2004  [DATE]

RESULT:     Multiplanar/multisequence imaging of the brain was obtained pre
and post intravenous administration of 17 cc of IV Magnevist.
Evaluation of diffusion-weighted images demonstrates no evidence of
increased signal intensity to suggest sequela of an acute or subacute
infarct.

The sella and parasellar regions and structures demonstrate no signal or
enhancement abnormalities.  There is no evidence of intraaxial or extraaxial
fluid collections.  No evidence of abnormal parenchyma or enhancing masses
is identified.  There does not appear to be evidence of demyelinating or
dysmyelinating disorders.  Evaluation of the posterior fossa and
cerebellopontine angle regions and structures demonstrates no signal or
enhancement abnormalities.  The visualized portions of the 7th and 8th
cranial nerves demonstrate no significant or enhancement abnormalities.
There is no evidence of free fluid or drainable loculated fluid collections.
IMPRESSION: Unremarkable brain MRI as described above.

## 2005-05-18 IMAGING — US US CAROTID DUPLEX BILAT
1 series · 17 of 24 positions shown · non-contrast
Comparison: none

REASON FOR EXAM: Status post fall, contusion
COMMENTS:

[Series 1: us carotid duplex bilat · 17 of 52 slices shown]
[im 1/52]
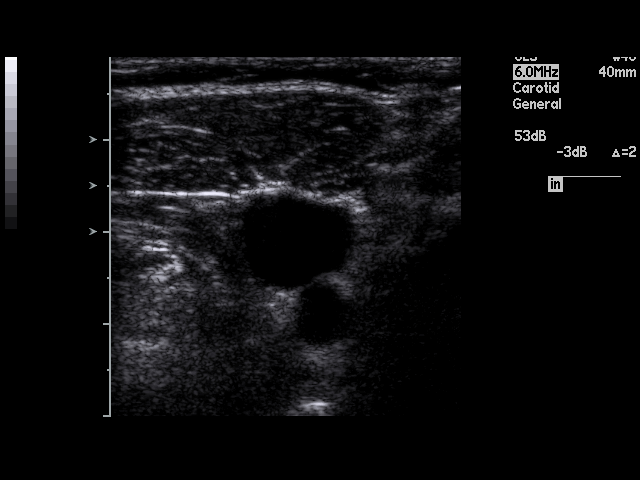
[im 5/52]
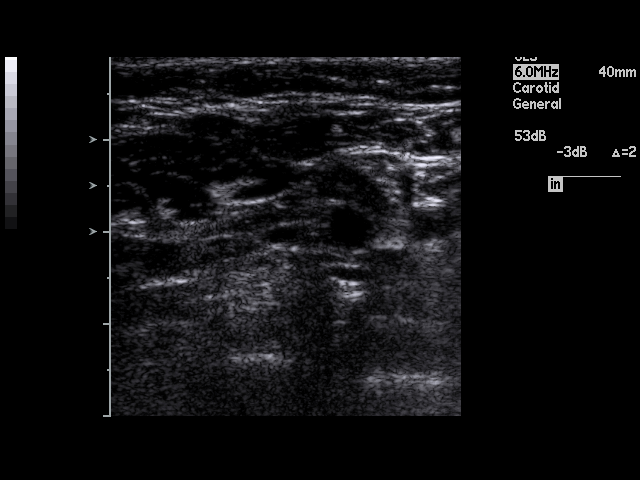
[im 7/52]
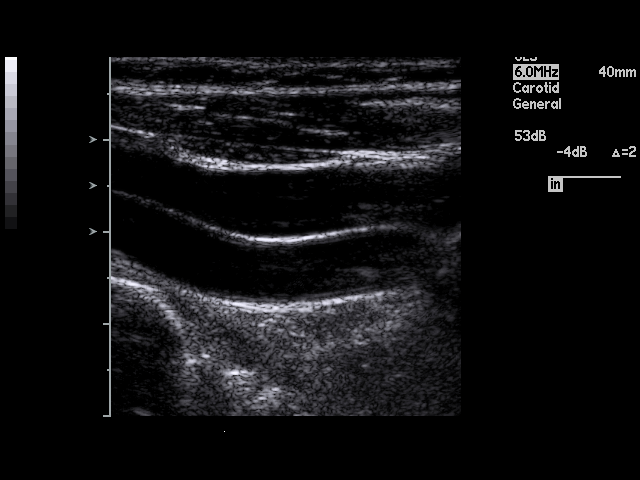
[im 9/52]
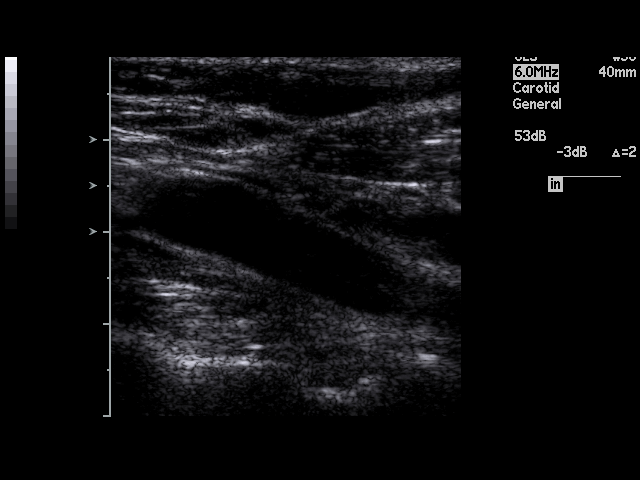
[im 14/52]
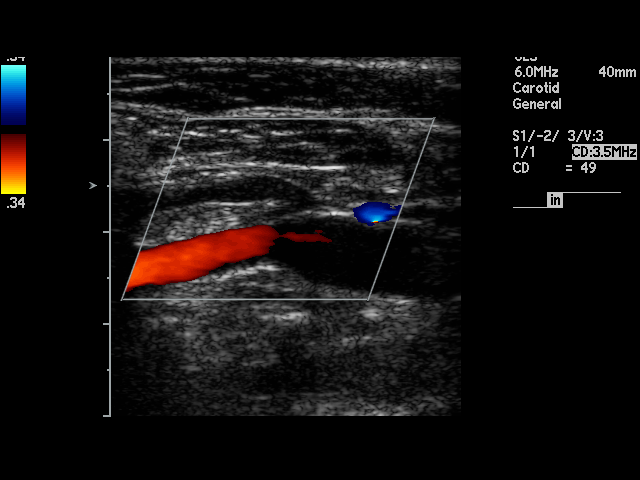
[im 16/52]
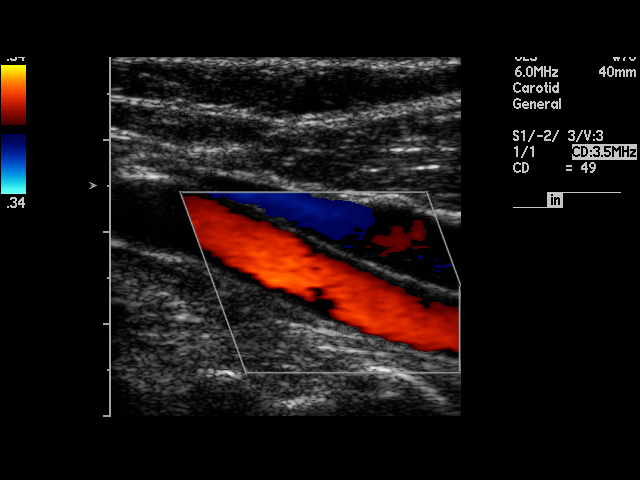
[im 20/52]
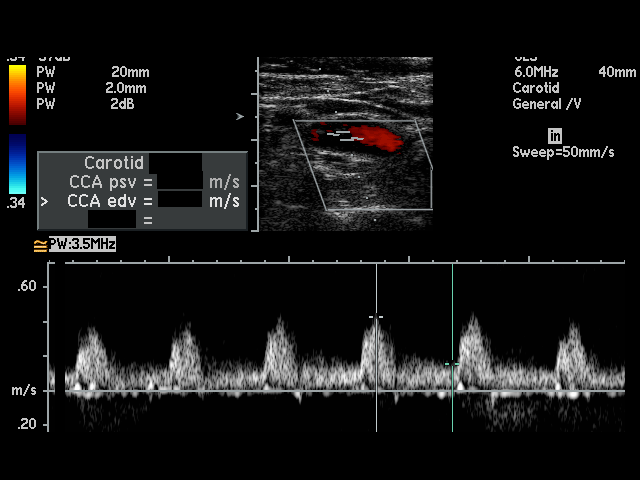
[im 23/52]
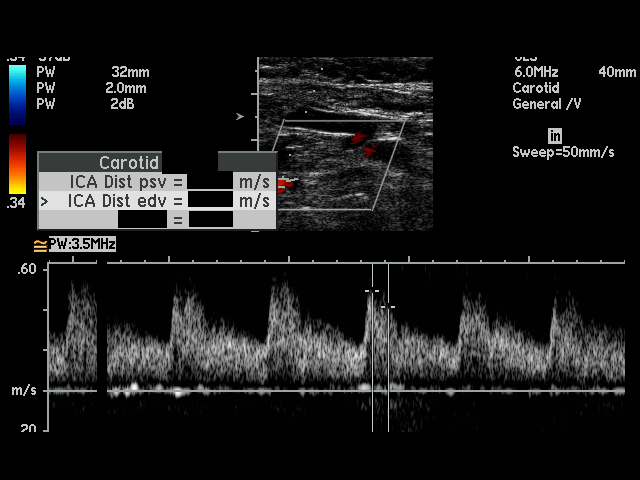
[im 27/52]
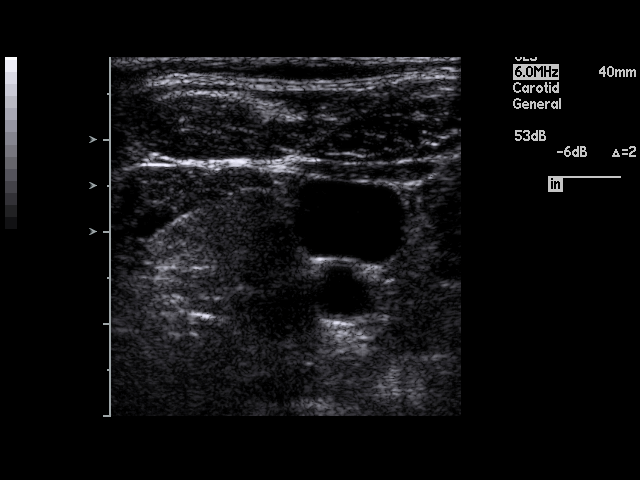
[im 29/52]
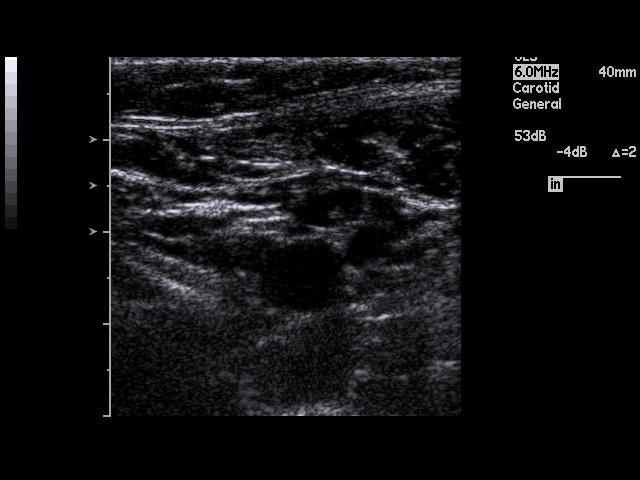
[im 32/52]
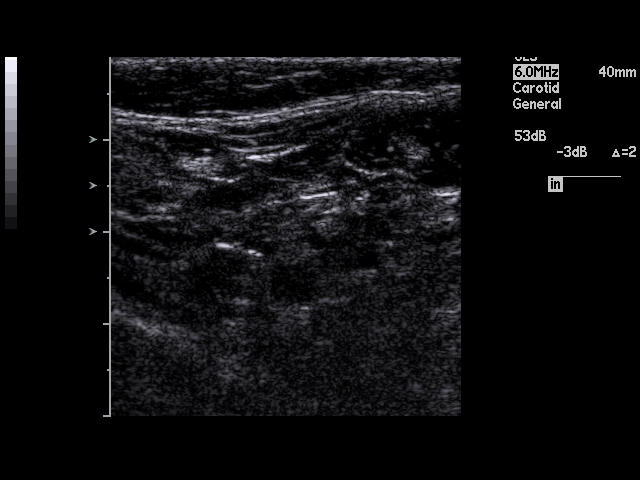
[im 36/52]
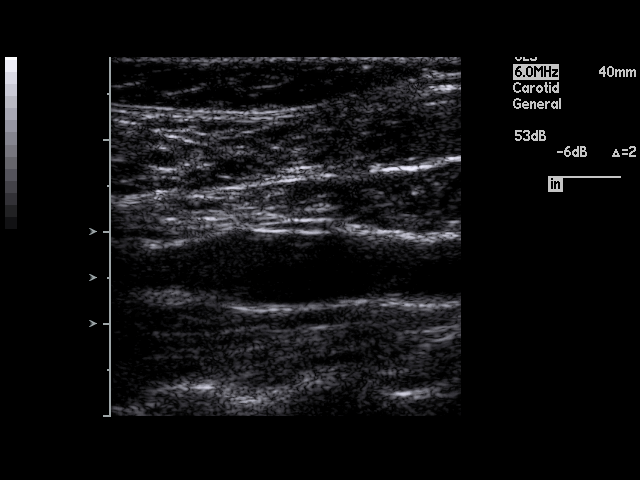
[im 38/52]
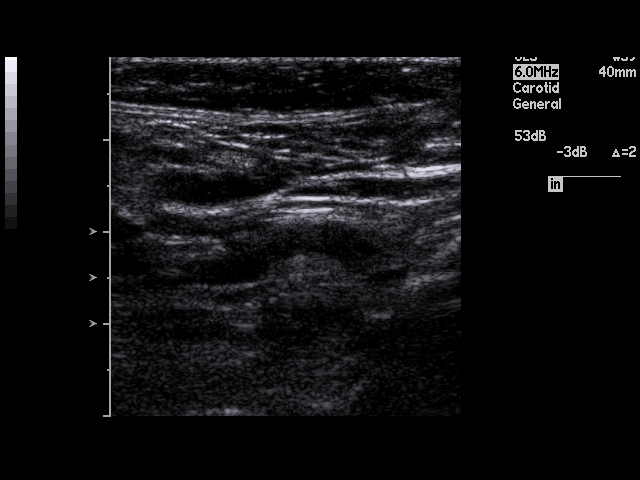
[im 43/52]
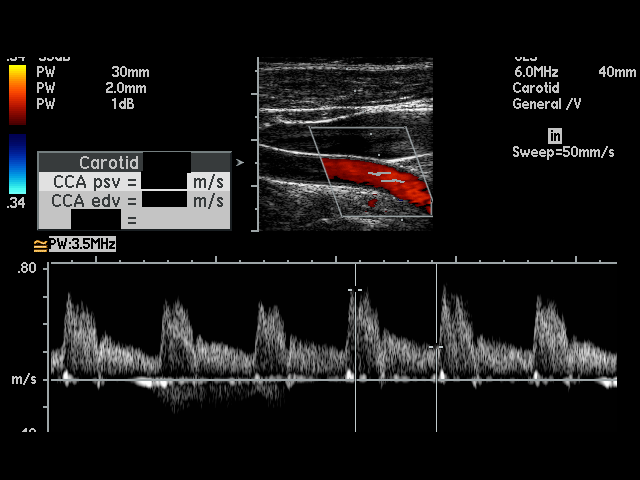
[im 45/52]
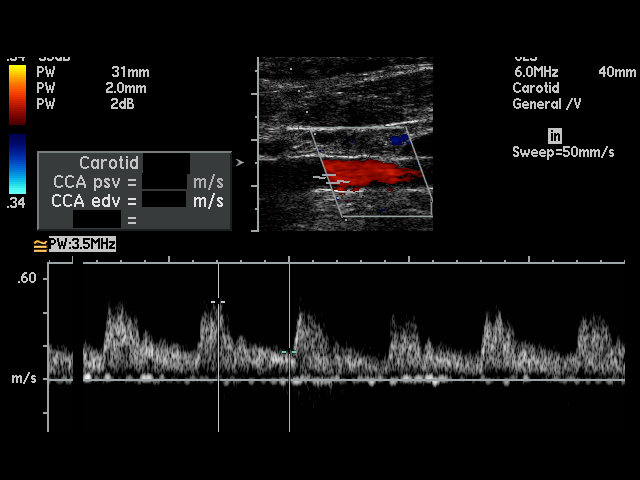
[im 47/52]
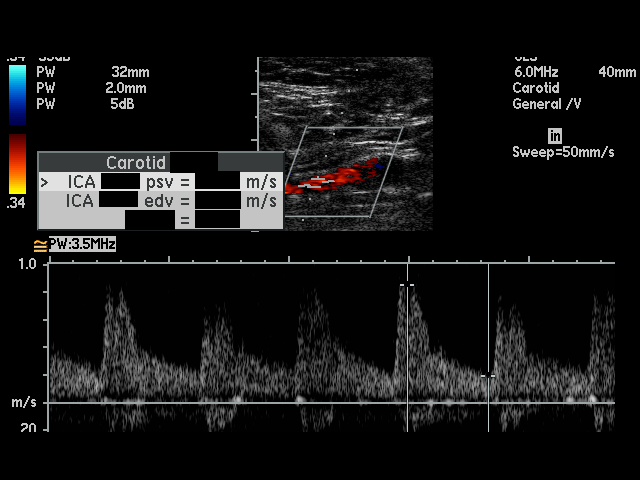
[im 52/52]
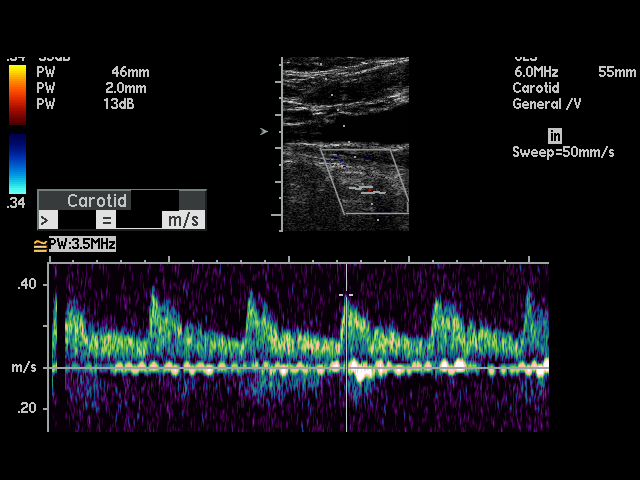

[17 of 24 positions shown; findings below may reference images not displayed]

PROCEDURE:     US  - US CAROTID DOPPLER BILATERAL  - September 27, 2004 [DATE]

RESULT:     Duplex Doppler evaluation of the carotid system is performed in
the standard fashion.

The study demonstrates antegrade flow seen in both vertebral arteries. The
color and spectral Doppler appearance is normal. The peak systolic velocity
ratio is 1.14 on the RIGHT and 1.31 on the LEFT.
IMPRESSION: No evidence of hemodynamically significant stenosis.

## 2005-06-01 ENCOUNTER — Ambulatory Visit: Payer: Self-pay | Admitting: Emergency Medicine

## 2005-06-02 ENCOUNTER — Ambulatory Visit: Payer: Self-pay | Admitting: Family Medicine

## 2005-06-04 ENCOUNTER — Ambulatory Visit (HOSPITAL_BASED_OUTPATIENT_CLINIC_OR_DEPARTMENT_OTHER): Admission: RE | Admit: 2005-06-04 | Discharge: 2005-06-04 | Payer: Self-pay | Admitting: Emergency Medicine

## 2005-06-10 ENCOUNTER — Ambulatory Visit: Payer: Self-pay | Admitting: Pulmonary Disease

## 2005-06-15 ENCOUNTER — Ambulatory Visit: Payer: Self-pay | Admitting: Internal Medicine

## 2005-06-16 ENCOUNTER — Ambulatory Visit: Payer: Self-pay | Admitting: Emergency Medicine

## 2005-06-21 ENCOUNTER — Other Ambulatory Visit: Admission: RE | Admit: 2005-06-21 | Discharge: 2005-06-21 | Payer: Self-pay | Admitting: Family Medicine

## 2005-06-21 ENCOUNTER — Ambulatory Visit: Payer: Self-pay | Admitting: Family Medicine

## 2005-06-27 ENCOUNTER — Ambulatory Visit (HOSPITAL_BASED_OUTPATIENT_CLINIC_OR_DEPARTMENT_OTHER): Admission: RE | Admit: 2005-06-27 | Discharge: 2005-06-27 | Payer: Self-pay | Admitting: Emergency Medicine

## 2005-07-12 ENCOUNTER — Ambulatory Visit: Payer: Self-pay | Admitting: Emergency Medicine

## 2005-07-14 ENCOUNTER — Ambulatory Visit: Payer: Self-pay | Admitting: Internal Medicine

## 2005-08-05 ENCOUNTER — Ambulatory Visit: Payer: Self-pay | Admitting: Emergency Medicine

## 2005-08-31 ENCOUNTER — Ambulatory Visit: Payer: Self-pay | Admitting: Family Medicine

## 2005-09-13 ENCOUNTER — Ambulatory Visit: Payer: Self-pay | Admitting: Emergency Medicine

## 2005-09-14 ENCOUNTER — Ambulatory Visit: Payer: Self-pay | Admitting: Family Medicine

## 2005-10-14 ENCOUNTER — Ambulatory Visit: Payer: Self-pay | Admitting: Family Medicine

## 2005-10-25 ENCOUNTER — Ambulatory Visit: Payer: Self-pay | Admitting: Emergency Medicine

## 2006-01-13 ENCOUNTER — Ambulatory Visit: Payer: Self-pay | Admitting: Internal Medicine

## 2006-02-10 ENCOUNTER — Ambulatory Visit: Payer: Self-pay | Admitting: Family Medicine

## 2006-03-02 ENCOUNTER — Ambulatory Visit: Payer: Self-pay | Admitting: Emergency Medicine

## 2006-03-15 ENCOUNTER — Ambulatory Visit: Payer: Self-pay | Admitting: Internal Medicine

## 2006-03-28 ENCOUNTER — Ambulatory Visit: Payer: Self-pay | Admitting: Family Medicine

## 2006-04-17 ENCOUNTER — Ambulatory Visit: Payer: Self-pay | Admitting: Emergency Medicine

## 2006-05-01 ENCOUNTER — Encounter: Admission: RE | Admit: 2006-05-01 | Discharge: 2006-05-01 | Payer: Self-pay | Admitting: Orthopaedic Surgery

## 2006-05-15 ENCOUNTER — Ambulatory Visit: Payer: Self-pay | Admitting: Internal Medicine

## 2006-06-15 ENCOUNTER — Ambulatory Visit: Payer: Self-pay | Admitting: Internal Medicine

## 2006-07-06 ENCOUNTER — Ambulatory Visit: Payer: Self-pay | Admitting: Family Medicine

## 2006-07-06 ENCOUNTER — Encounter: Payer: Self-pay | Admitting: Family Medicine

## 2006-07-06 ENCOUNTER — Other Ambulatory Visit: Admission: RE | Admit: 2006-07-06 | Discharge: 2006-07-06 | Payer: Self-pay | Admitting: Family Medicine

## 2006-07-07 LAB — CONVERTED CEMR LAB
ALT: 20 units/L (ref 0–40)
Albumin: 3.9 g/dL (ref 3.5–5.2)
Alkaline Phosphatase: 92 units/L (ref 39–117)
Bilirubin, Direct: 0.1 mg/dL (ref 0.0–0.3)
Chloride: 107 meq/L (ref 96–112)
Cholesterol: 239 mg/dL (ref 0–200)
Direct LDL: 163.2 mg/dL
Eosinophils Relative: 1 % (ref 0.0–5.0)
GFR calc non Af Amer: 69 mL/min
Glucose, Bld: 88 mg/dL (ref 70–99)
HDL: 59.4 mg/dL (ref >39.0)
Monocytes Absolute: 0.4 10*3/uL (ref 0.2–0.7)
Monocytes Relative: 4.3 % (ref 3.0–11.0)
RDW: 13 % (ref 11.5–14.6)
TSH: 1.5 microintl units/mL (ref 0.35–5.50)
Total Bilirubin: 0.5 mg/dL (ref 0.3–1.2)
Total Protein: 6.9 g/dL (ref 6.0–8.3)
WBC: 9 10*3/uL (ref 4.5–10.5)

## 2006-07-11 ENCOUNTER — Ambulatory Visit: Payer: Self-pay | Admitting: Internal Medicine

## 2006-07-20 ENCOUNTER — Inpatient Hospital Stay (HOSPITAL_COMMUNITY): Admission: RE | Admit: 2006-07-20 | Discharge: 2006-07-24 | Payer: Self-pay | Admitting: Orthopaedic Surgery

## 2006-10-16 ENCOUNTER — Ambulatory Visit: Payer: Self-pay | Admitting: Emergency Medicine

## 2006-10-24 ENCOUNTER — Encounter: Payer: Self-pay | Admitting: Internal Medicine

## 2006-11-02 ENCOUNTER — Ambulatory Visit: Payer: Self-pay | Admitting: Pulmonary Disease

## 2006-11-02 LAB — CONVERTED CEMR LAB
BUN: 10 mg/dL (ref 6–23)
Basophils Relative: 0.5 % (ref 0.0–1.0)
Calcium: 9.6 mg/dL (ref 8.4–10.5)
Eosinophils Relative: 1 % (ref 0.0–5.0)
Folate: 9.4 ng/mL
GFR calc non Af Amer: 79 mL/min
Iron: 67 ug/dL (ref 42–145)
Lymphocytes Relative: 18.2 % (ref 12.0–46.0)
MCHC: 34.8 g/dL (ref 30.0–36.0)
Monocytes Absolute: 0.8 10*3/uL — ABNORMAL HIGH (ref 0.2–0.7)
Monocytes Relative: 6.8 % (ref 3.0–11.0)
Platelets: 221 10*3/uL (ref 150–400)
RBC: 4.54 M/uL (ref 3.87–5.11)
RDW: 12.8 % (ref 11.5–14.6)
Total Bilirubin: 0.6 mg/dL (ref 0.3–1.2)
Total Protein: 6.8 g/dL (ref 6.0–8.3)
Transferrin: 226.2 mg/dL (ref >=212.0)
Vitamin B-12: 395 pg/mL (ref 211–911)

## 2006-11-29 ENCOUNTER — Emergency Department: Payer: Self-pay | Admitting: General Practice

## 2006-11-30 ENCOUNTER — Ambulatory Visit (HOSPITAL_BASED_OUTPATIENT_CLINIC_OR_DEPARTMENT_OTHER): Admission: RE | Admit: 2006-11-30 | Discharge: 2006-11-30 | Payer: Self-pay | Admitting: Pulmonary Disease

## 2006-11-30 ENCOUNTER — Ambulatory Visit: Payer: Self-pay | Admitting: Pulmonary Disease

## 2006-12-04 ENCOUNTER — Encounter: Payer: Self-pay | Admitting: Orthopaedic Surgery

## 2006-12-05 ENCOUNTER — Encounter: Payer: Self-pay | Admitting: Orthopaedic Surgery

## 2007-01-04 ENCOUNTER — Ambulatory Visit: Payer: Self-pay | Admitting: Pulmonary Disease

## 2007-03-07 ENCOUNTER — Ambulatory Visit: Payer: Self-pay | Admitting: Pulmonary Disease

## 2007-03-07 IMAGING — CR DG CHEST 2V
2 series · 2 of 2 positions shown · non-contrast
Comparison: none

CLINICAL DATA: Preoperative respiratory exam for spinal surgery.  Smoking history.  Cough.
 CHEST - 2 VIEWS:

[view not recorded (1 of 2)]
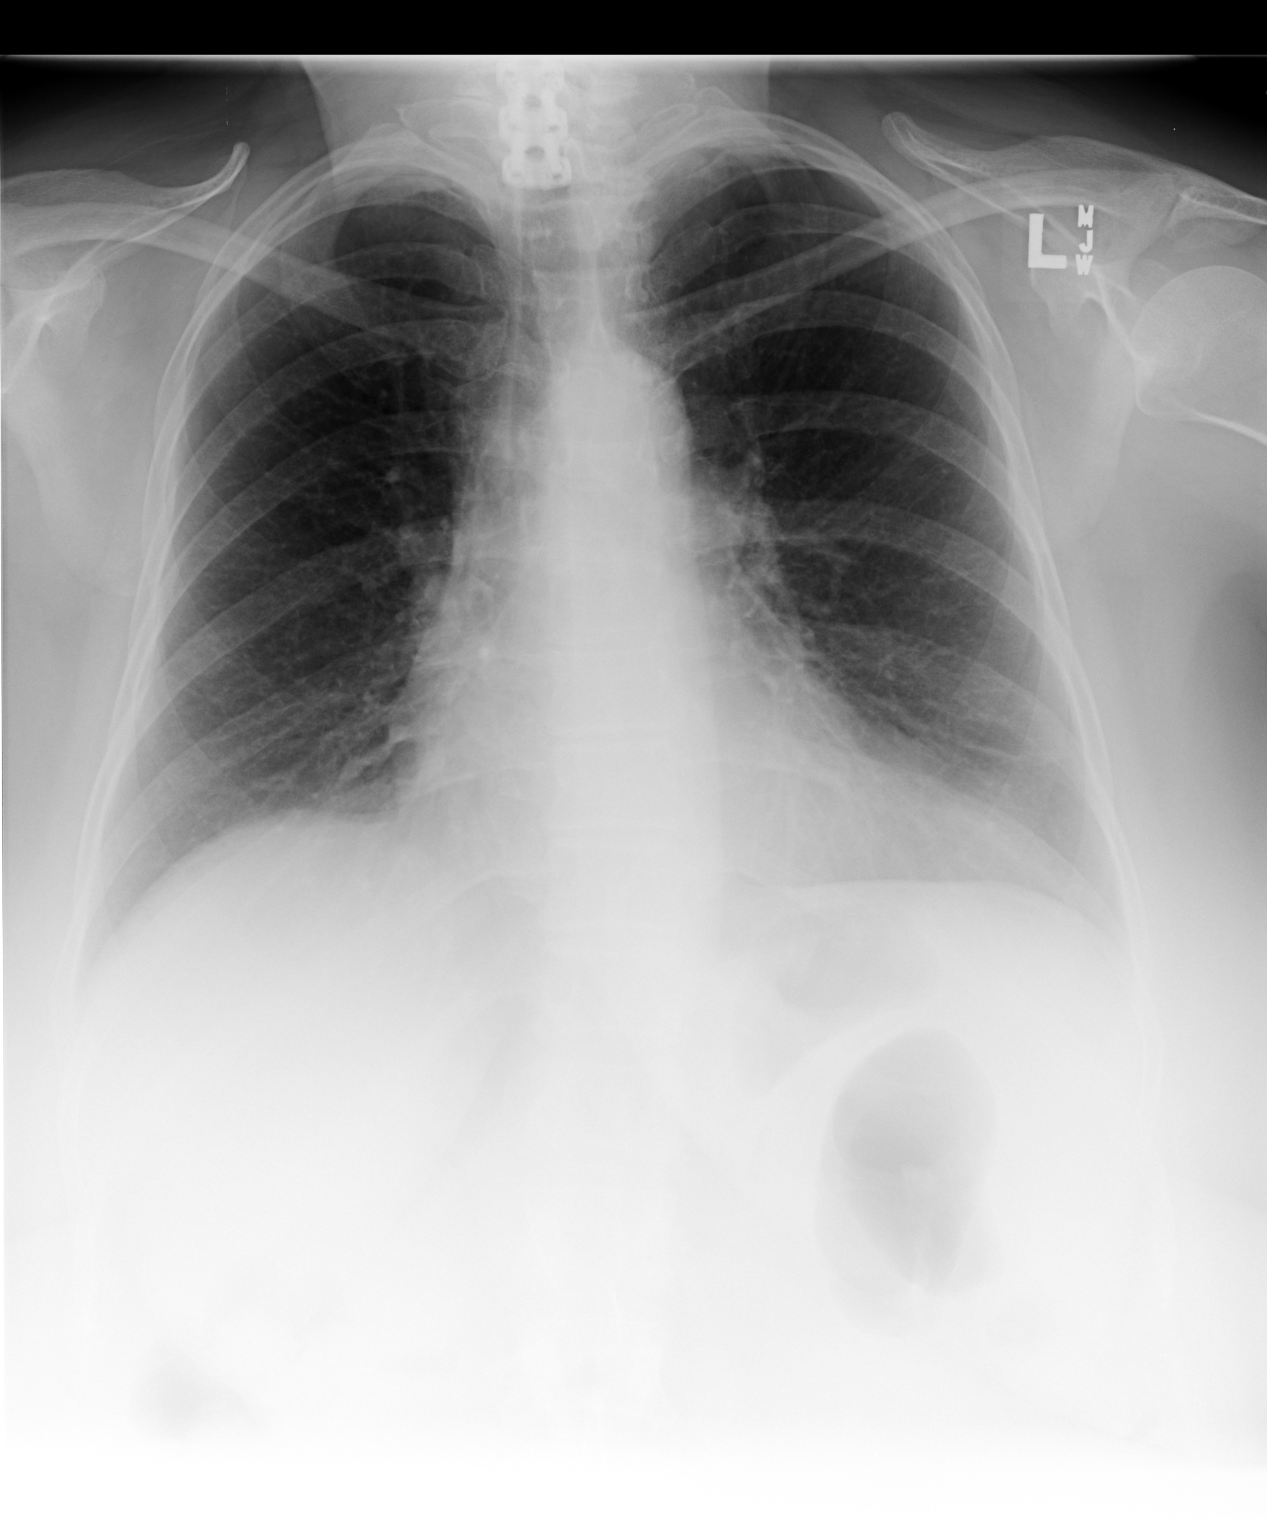

[view not recorded (2 of 2)]
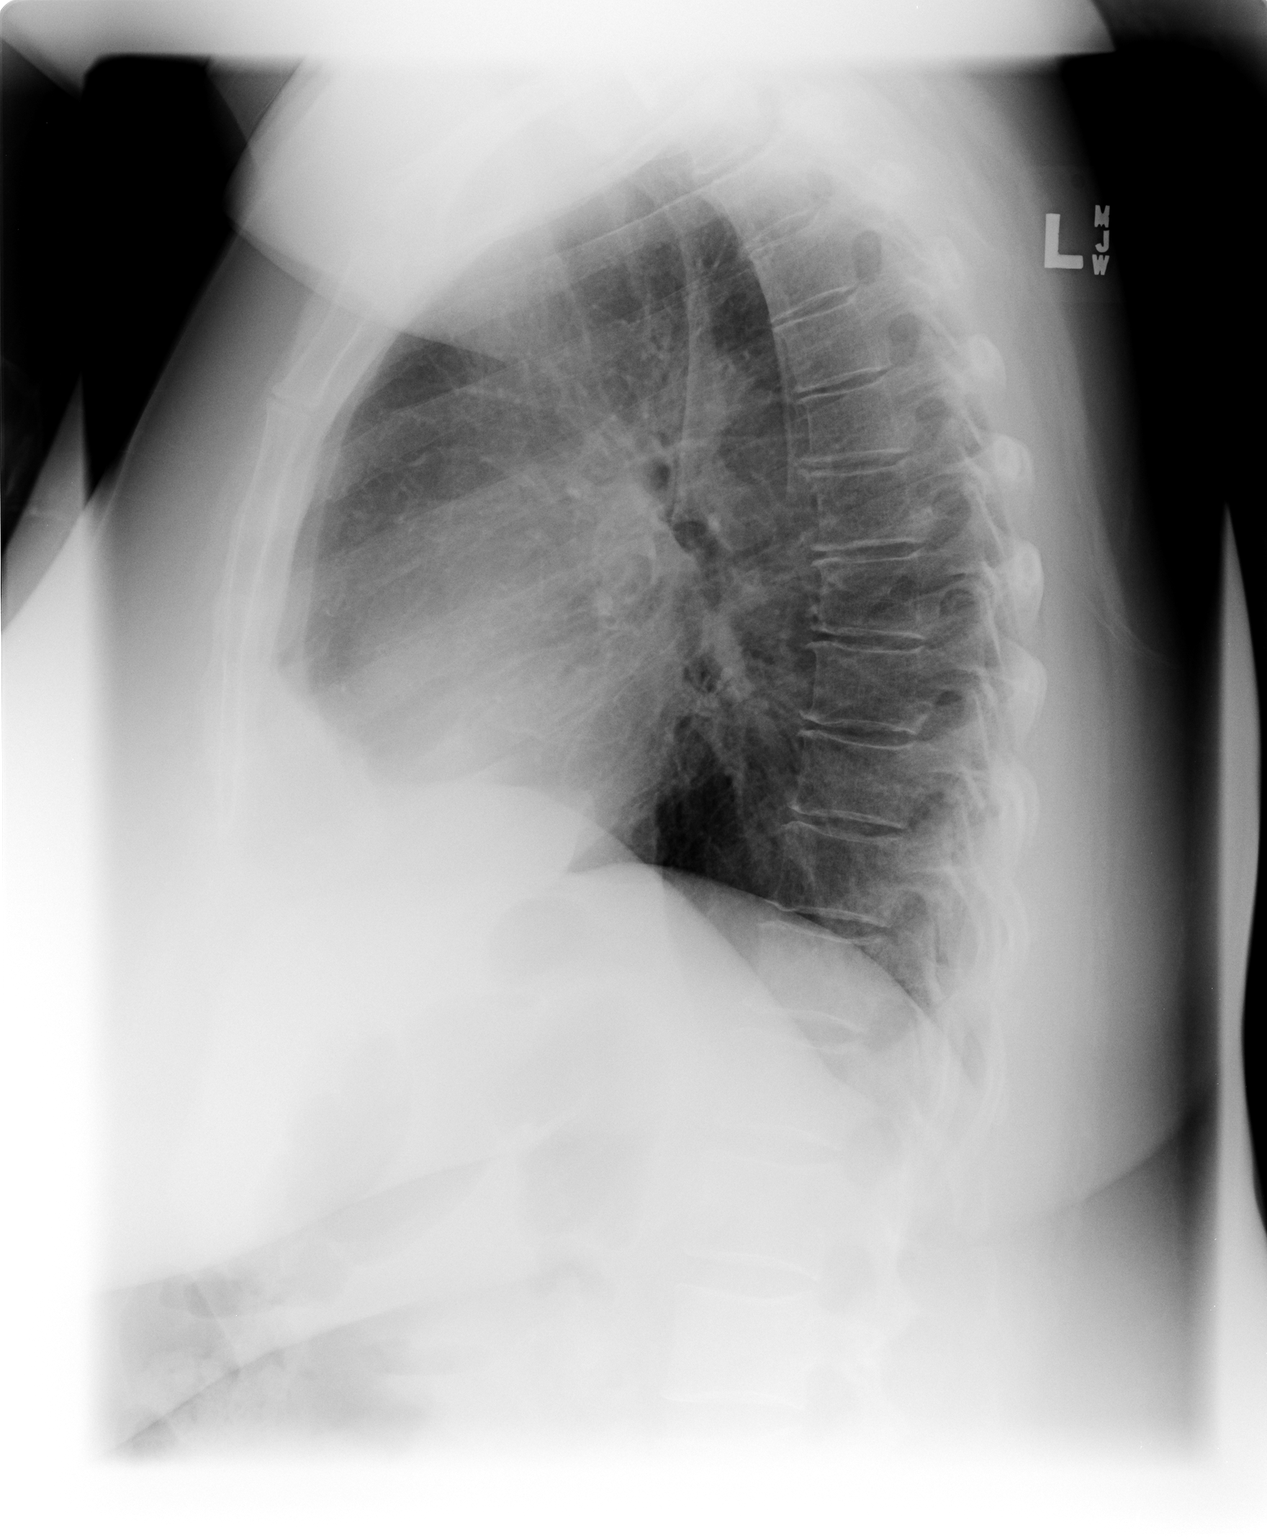

[2 of 2 positions shown; findings below may reference images not displayed]

FINDINGS: The heart size is normal.   The mediastinum is unremarkable.   The lungs are clear.   No effusions.  Ordinary degenerative changes affect the spine.
IMPRESSION: No active disease.

## 2007-03-08 DIAGNOSIS — B181 Chronic viral hepatitis B without delta-agent: Secondary | ICD-10-CM

## 2007-03-08 DIAGNOSIS — I10 Essential (primary) hypertension: Secondary | ICD-10-CM | POA: Insufficient documentation

## 2007-03-08 DIAGNOSIS — Z8659 Personal history of other mental and behavioral disorders: Secondary | ICD-10-CM

## 2007-03-08 DIAGNOSIS — I00 Rheumatic fever without heart involvement: Secondary | ICD-10-CM | POA: Insufficient documentation

## 2007-03-08 DIAGNOSIS — E785 Hyperlipidemia, unspecified: Secondary | ICD-10-CM

## 2007-03-08 DIAGNOSIS — G4733 Obstructive sleep apnea (adult) (pediatric): Secondary | ICD-10-CM | POA: Insufficient documentation

## 2007-03-08 DIAGNOSIS — G988 Other disorders of nervous system: Secondary | ICD-10-CM | POA: Insufficient documentation

## 2007-03-08 DIAGNOSIS — R32 Unspecified urinary incontinence: Secondary | ICD-10-CM | POA: Insufficient documentation

## 2007-03-08 DIAGNOSIS — J45909 Unspecified asthma, uncomplicated: Secondary | ICD-10-CM | POA: Insufficient documentation

## 2007-03-10 IMAGING — CR DG LUMBAR SPINE 2-3V
1 series · 1 of 1 positions shown · non-contrast
Comparison: None.

CLINICAL DATA: L4-5 revision laminectomy.  Posterior fusion.  
 LUMBAR SPINE ? 2 VIEW:

[view not recorded]
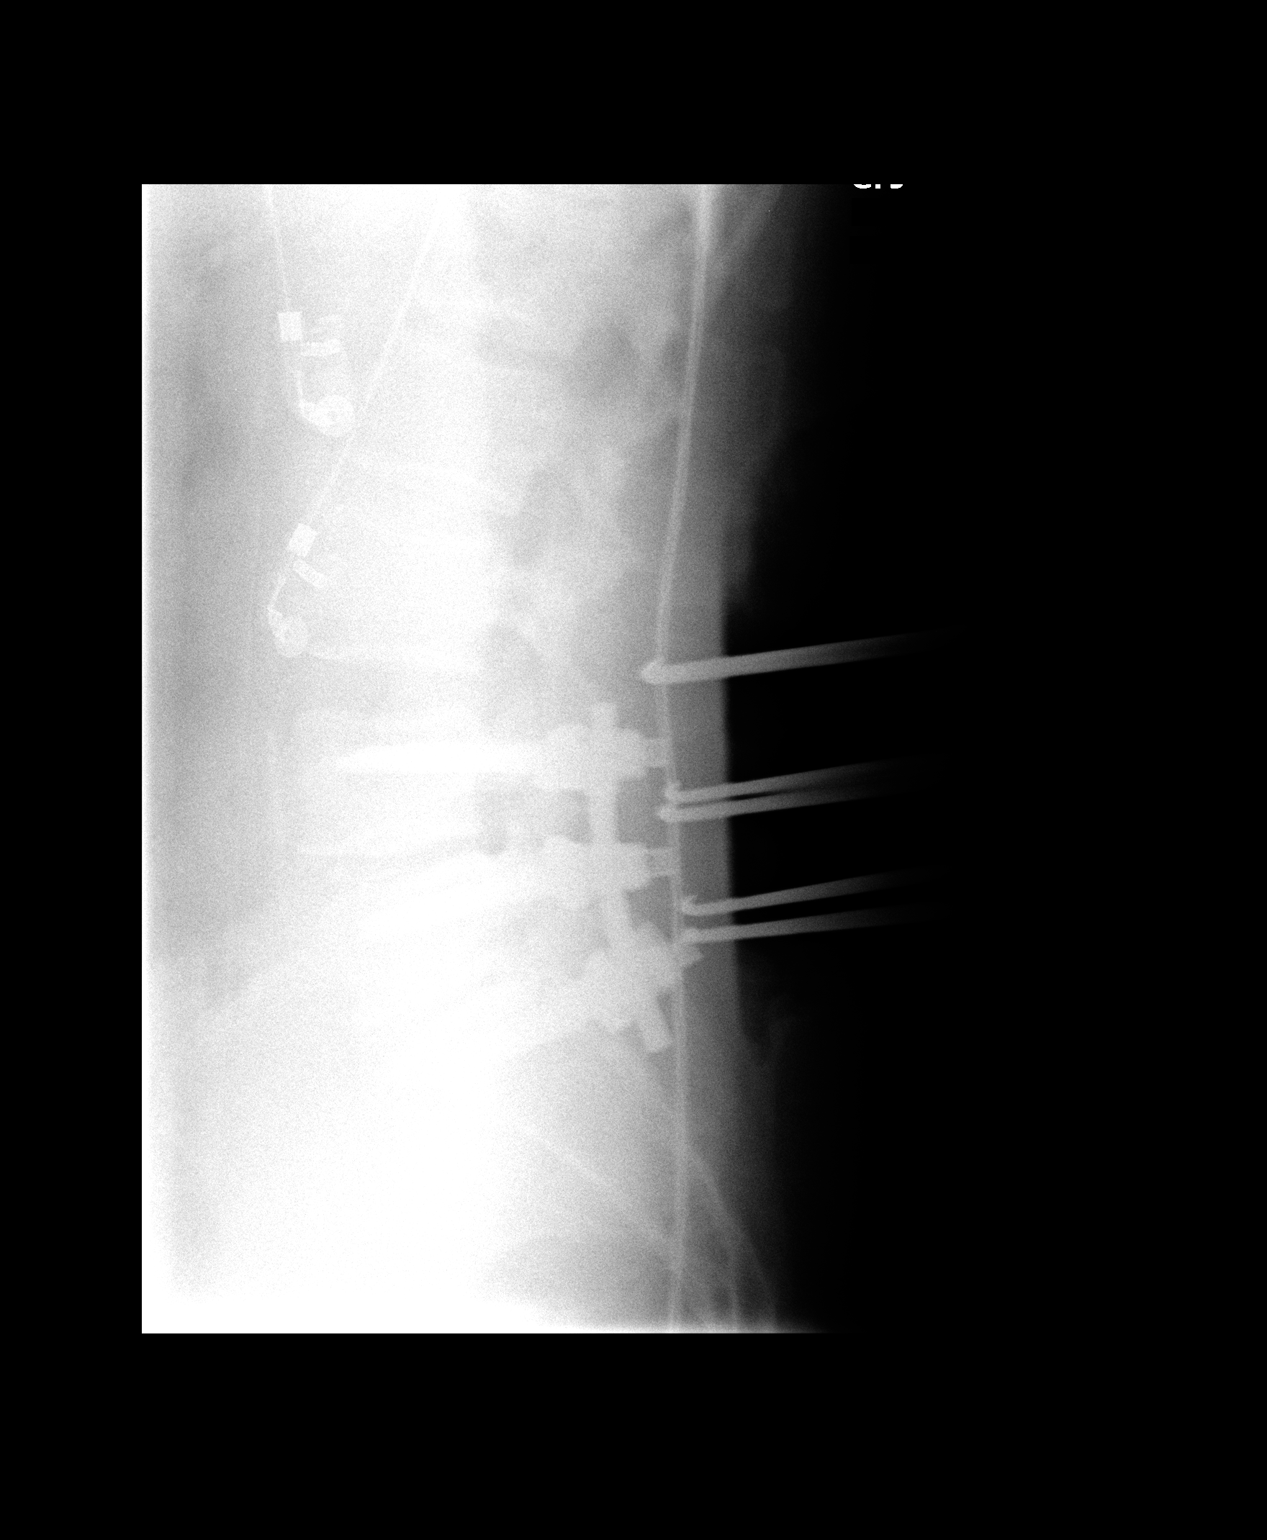

[1 of 1 positions shown; findings below may reference images not displayed]

FINDINGS: On the image labeled #1, there are tissue spreaders posterior to the L3 and the L5 vertebrae.  Suture material is identified extending from the disc space of L3-4 to the disc space of L5-S1.  Two surgical probes are identified; one is directed towards the inferior endplate of L4.  The second one is directed towards the pedicle of L5.  
 On the image labeled #2, transpedicular screws are seen at L4, L5 and S1.  Harrington rod extends from L4 through S1.  The alignment of the lumbar spine appears anatomic.
IMPRESSION: Intraoperative films show posterior laminectomy and internal fusion of L4 through S1.

## 2007-04-03 ENCOUNTER — Telehealth (INDEPENDENT_AMBULATORY_CARE_PROVIDER_SITE_OTHER): Payer: Self-pay | Admitting: *Deleted

## 2007-04-04 DIAGNOSIS — D126 Benign neoplasm of colon, unspecified: Secondary | ICD-10-CM

## 2007-04-26 ENCOUNTER — Ambulatory Visit: Payer: Self-pay | Admitting: Family Medicine

## 2007-04-27 ENCOUNTER — Encounter: Payer: Self-pay | Admitting: Pulmonary Disease

## 2007-04-27 ENCOUNTER — Ambulatory Visit: Payer: Self-pay | Admitting: Pulmonary Disease

## 2007-04-27 DIAGNOSIS — G4700 Insomnia, unspecified: Secondary | ICD-10-CM

## 2007-04-27 DIAGNOSIS — G2581 Restless legs syndrome: Secondary | ICD-10-CM

## 2007-04-30 ENCOUNTER — Ambulatory Visit: Payer: Self-pay | Admitting: Gastroenterology

## 2007-04-30 LAB — CONVERTED CEMR LAB
ALT: 49 units/L — ABNORMAL HIGH (ref 0–35)
Albumin: 3.7 g/dL (ref 3.5–5.2)
Alkaline Phosphatase: 143 units/L — ABNORMAL HIGH (ref 39–117)
Basophils Absolute: 0 10*3/uL (ref 0.0–0.1)
Chloride: 100 meq/L (ref 96–112)
Creatinine, Ser: 0.8 mg/dL (ref 0.4–1.2)
Eosinophils Absolute: 0 10*3/uL (ref 0.0–0.6)
Eosinophils Relative: 0.1 % (ref 0.0–5.0)
GFR calc non Af Amer: 79 mL/min
HCV Ab: NEGATIVE
Hemoglobin: 13.7 g/dL (ref 12.0–15.0)
Hep B S Ab: NEGATIVE
Hepatitis B Surface Ag: NEGATIVE
INR: 0.8 (ref 0.8–1.0)
Lymphocytes Relative: 18.1 % (ref 12.0–46.0)
MCV: 94.3 fL (ref 78.0–100.0)
Monocytes Absolute: 0.5 10*3/uL (ref 0.2–0.7)
Neutrophils Relative %: 75.8 % (ref 43.0–77.0)
Prothrombin Time: 11 s (ref 10.9–13.3)
RDW: 13.2 % (ref 11.5–14.6)
Sodium: 137 meq/L (ref 135–145)
Total Protein: 7.1 g/dL (ref 6.0–8.3)
WBC: 8.5 10*3/uL (ref 4.5–10.5)
aPTT: 31.7 s — ABNORMAL HIGH (ref 21.7–29.8)

## 2007-05-02 ENCOUNTER — Telehealth (INDEPENDENT_AMBULATORY_CARE_PROVIDER_SITE_OTHER): Payer: Self-pay | Admitting: *Deleted

## 2007-05-11 ENCOUNTER — Ambulatory Visit (HOSPITAL_COMMUNITY): Admission: RE | Admit: 2007-05-11 | Discharge: 2007-05-11 | Payer: Self-pay | Admitting: Gastroenterology

## 2007-05-15 ENCOUNTER — Ambulatory Visit: Payer: Self-pay | Admitting: Gastroenterology

## 2007-05-15 LAB — CONVERTED CEMR LAB
ALT: 31 units/L (ref 0–35)
Alkaline Phosphatase: 141 units/L — ABNORMAL HIGH (ref 39–117)
Total Protein: 7 g/dL (ref 6.0–8.3)

## 2007-05-22 ENCOUNTER — Ambulatory Visit: Payer: Self-pay | Admitting: Gastroenterology

## 2007-05-22 ENCOUNTER — Encounter: Payer: Self-pay | Admitting: Internal Medicine

## 2007-05-22 LAB — HM COLONOSCOPY: HM Colonoscopy: NORMAL

## 2007-06-12 ENCOUNTER — Telehealth (INDEPENDENT_AMBULATORY_CARE_PROVIDER_SITE_OTHER): Payer: Self-pay | Admitting: Internal Medicine

## 2007-07-13 ENCOUNTER — Ambulatory Visit: Payer: Self-pay | Admitting: Family Medicine

## 2007-07-13 ENCOUNTER — Encounter: Payer: Self-pay | Admitting: Internal Medicine

## 2007-07-16 ENCOUNTER — Telehealth (INDEPENDENT_AMBULATORY_CARE_PROVIDER_SITE_OTHER): Payer: Self-pay | Admitting: Internal Medicine

## 2007-07-16 ENCOUNTER — Encounter (INDEPENDENT_AMBULATORY_CARE_PROVIDER_SITE_OTHER): Payer: Self-pay | Admitting: Internal Medicine

## 2007-07-17 ENCOUNTER — Encounter (INDEPENDENT_AMBULATORY_CARE_PROVIDER_SITE_OTHER): Payer: Self-pay | Admitting: *Deleted

## 2007-07-19 ENCOUNTER — Telehealth (INDEPENDENT_AMBULATORY_CARE_PROVIDER_SITE_OTHER): Payer: Self-pay | Admitting: *Deleted

## 2007-07-20 IMAGING — CR RIGHT FOURTH TOE
1 series · 3 of 3 positions shown · non-contrast
Comparison: none

REASON FOR EXAM: pain
COMMENTS:

[Series 1: view not recorded · 0.17mm/px · 3 of 3 slices shown]
[im 1/3]
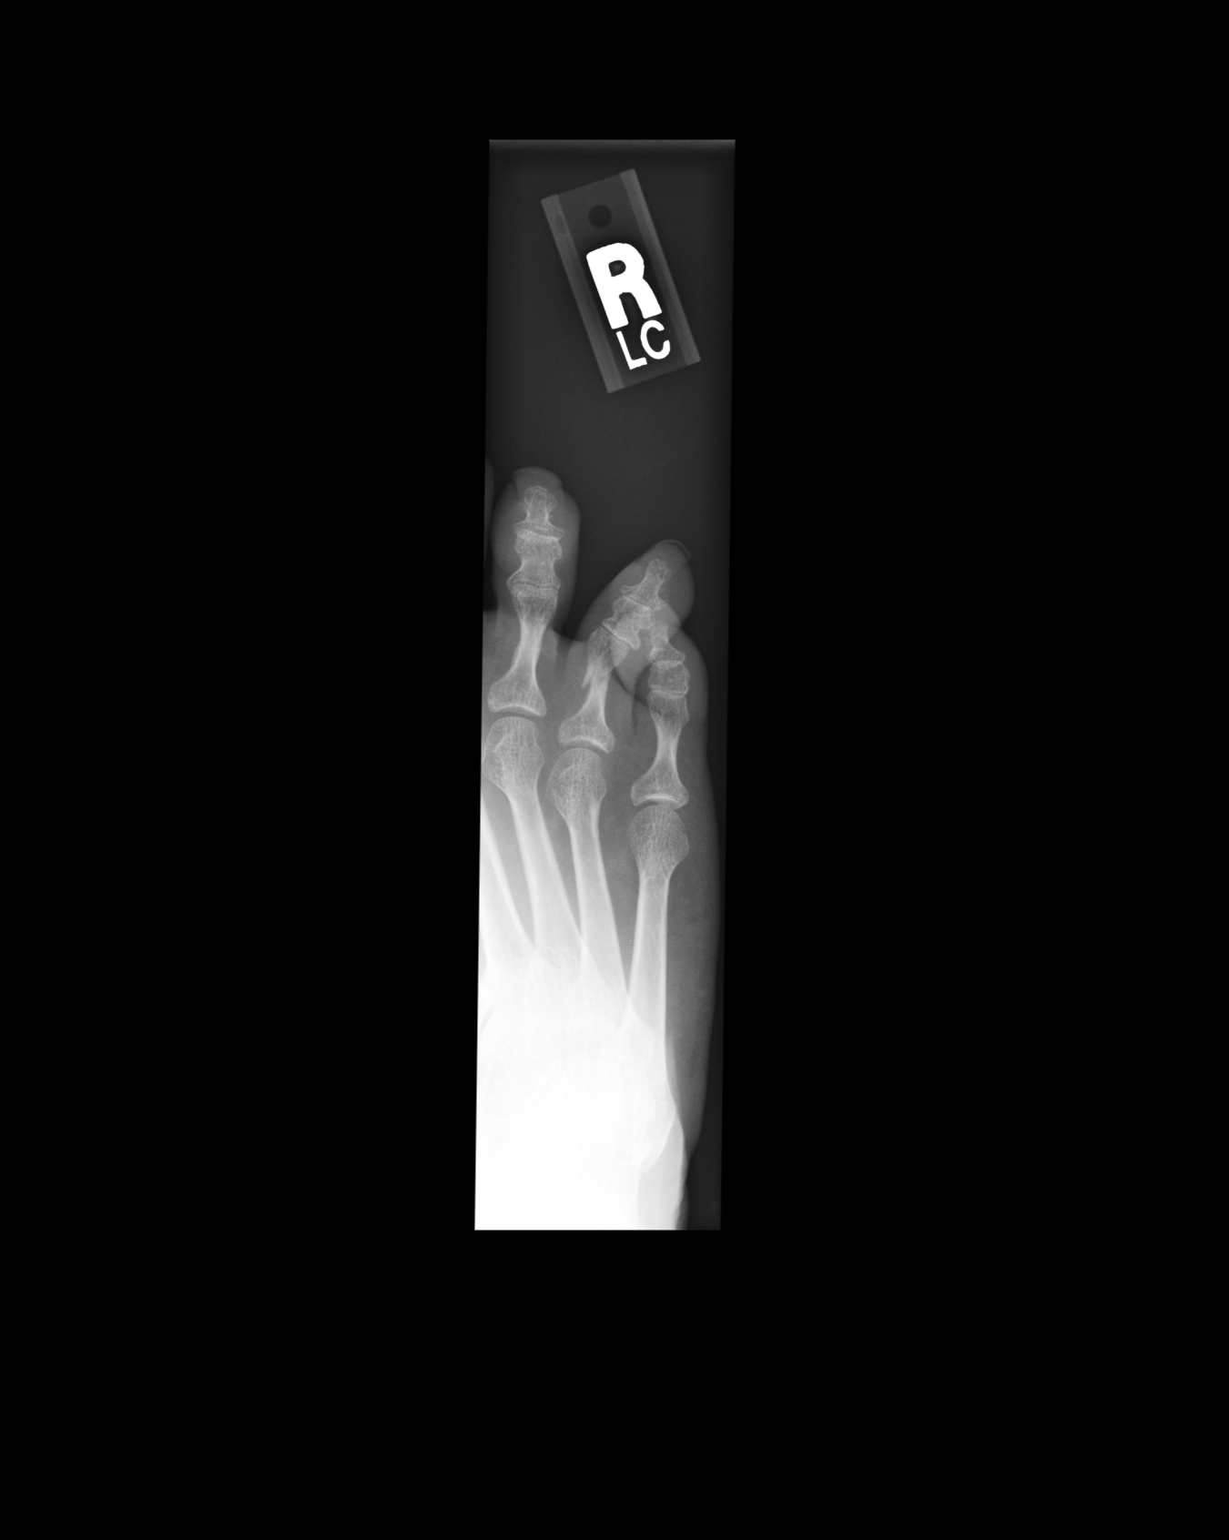
[im 2/3]
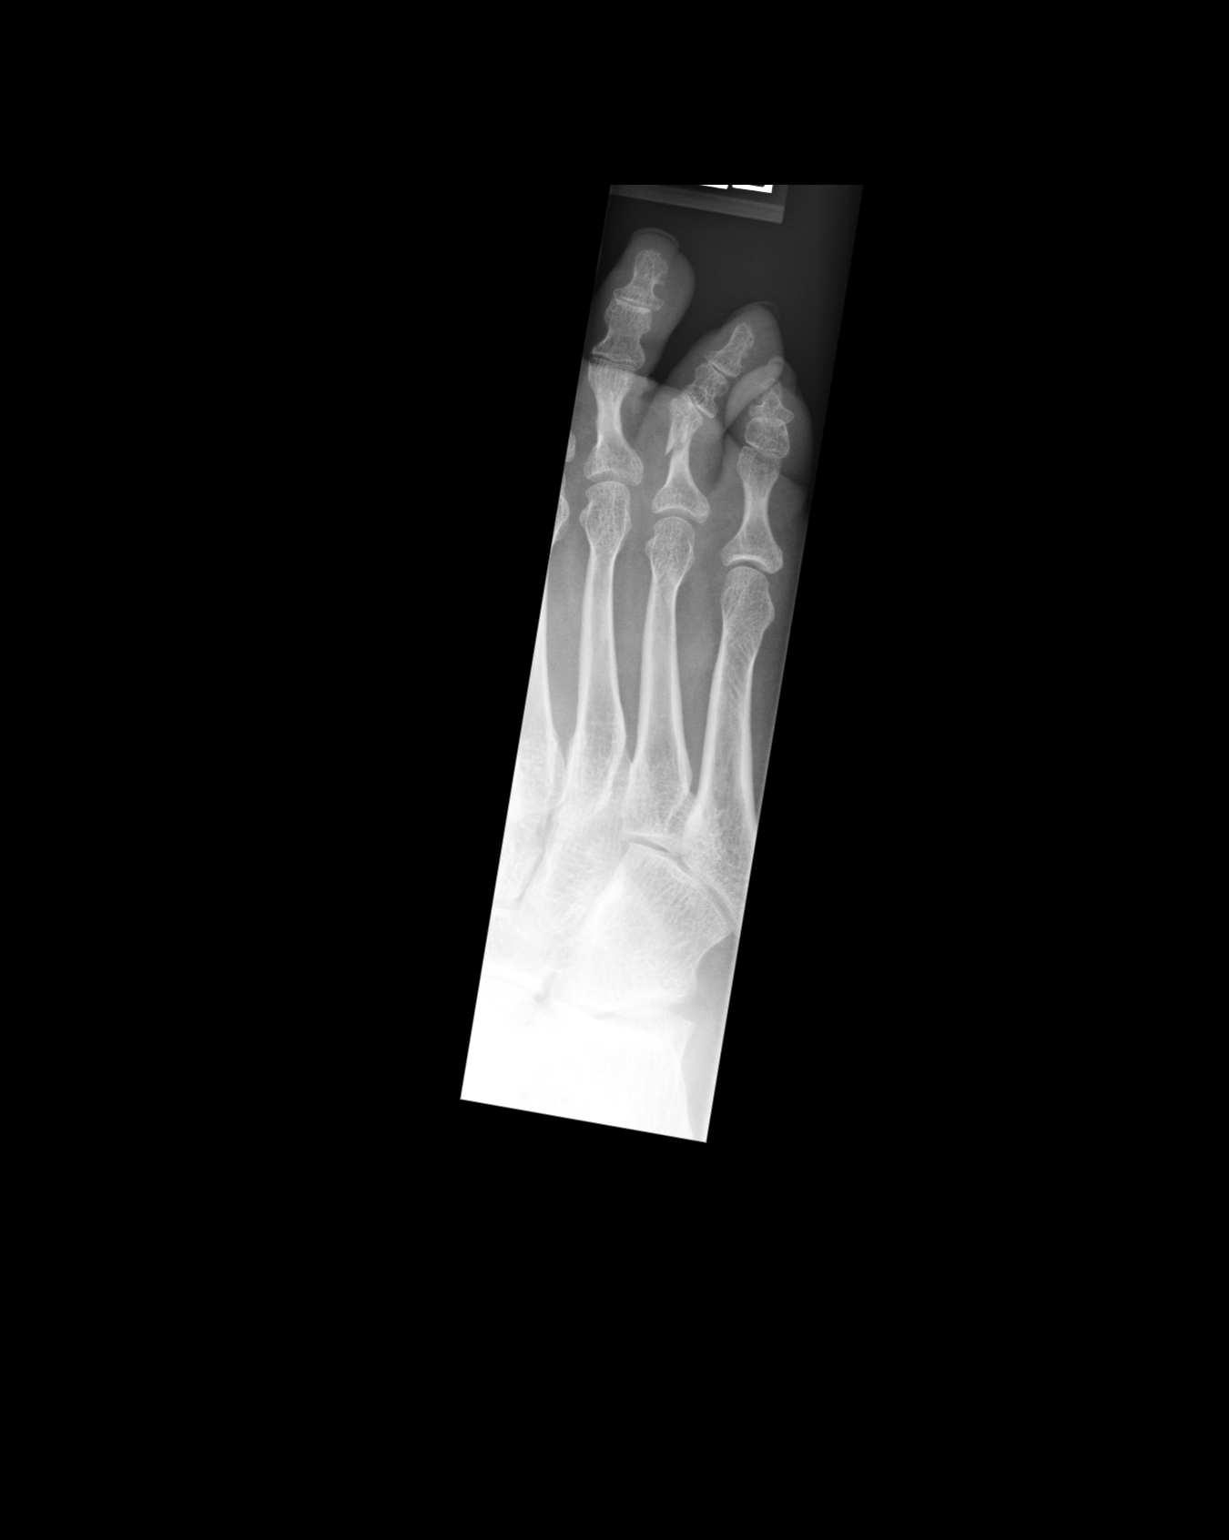
[im 3/3]
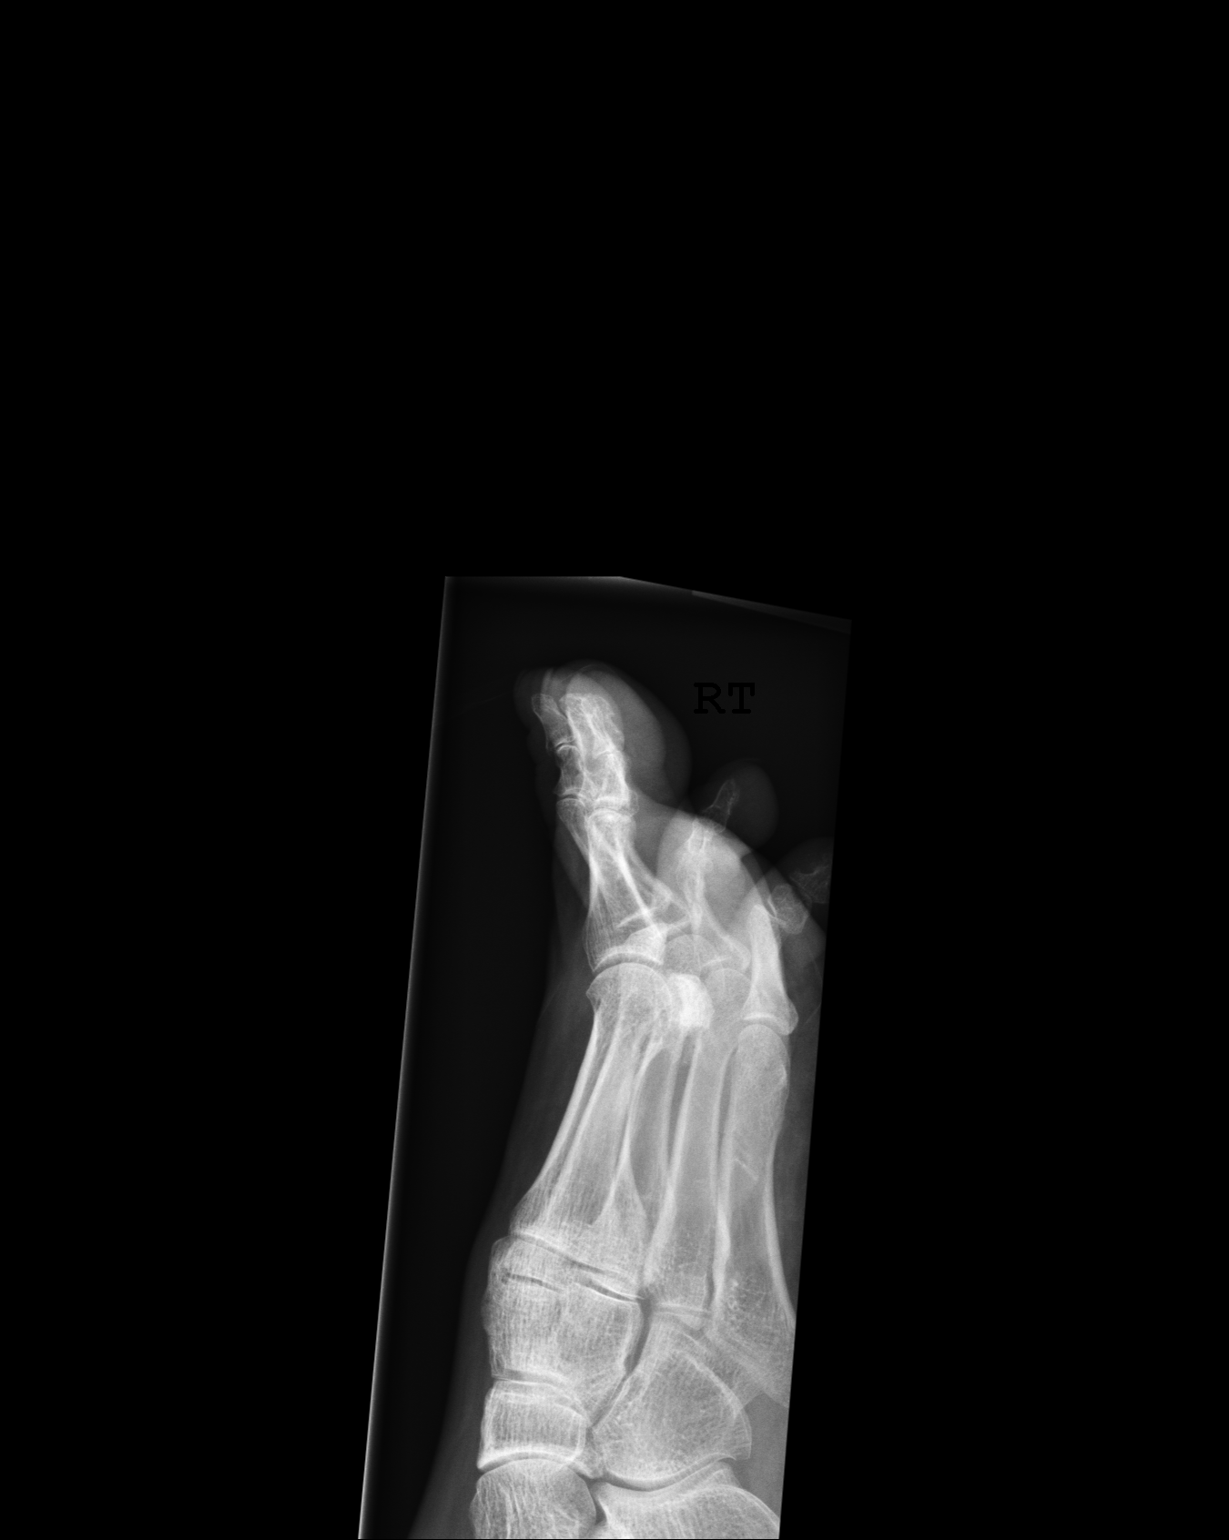

[3 of 3 positions shown; findings below may reference images not displayed]

PROCEDURE:     DXR - DXR TOE 4TH DIGIT RIGHT FOOT  - November 29, 2006  [DATE]

RESULT:     Three views were obtained. There is observed a minimally
displaced oblique fracture of the distal shaft of the proximal phalanx of
the fourth toe. There is observed lateral angulation of the distal fracture
component with respect to the proximal.
IMPRESSION: Fracture of the proximal phalanx of the RIGHT fourth toe.

## 2007-07-23 ENCOUNTER — Telehealth (INDEPENDENT_AMBULATORY_CARE_PROVIDER_SITE_OTHER): Payer: Self-pay | Admitting: *Deleted

## 2007-07-26 ENCOUNTER — Telehealth (INDEPENDENT_AMBULATORY_CARE_PROVIDER_SITE_OTHER): Payer: Self-pay | Admitting: *Deleted

## 2007-07-26 ENCOUNTER — Ambulatory Visit: Payer: Self-pay | Admitting: Pulmonary Disease

## 2007-07-30 ENCOUNTER — Emergency Department: Payer: Self-pay | Admitting: Emergency Medicine

## 2007-07-30 ENCOUNTER — Other Ambulatory Visit: Payer: Self-pay

## 2007-08-02 ENCOUNTER — Telehealth (INDEPENDENT_AMBULATORY_CARE_PROVIDER_SITE_OTHER): Payer: Self-pay | Admitting: *Deleted

## 2007-08-13 ENCOUNTER — Telehealth (INDEPENDENT_AMBULATORY_CARE_PROVIDER_SITE_OTHER): Payer: Self-pay | Admitting: *Deleted

## 2007-08-21 ENCOUNTER — Telehealth (INDEPENDENT_AMBULATORY_CARE_PROVIDER_SITE_OTHER): Payer: Self-pay | Admitting: *Deleted

## 2007-08-23 ENCOUNTER — Ambulatory Visit: Payer: Self-pay | Admitting: Family Medicine

## 2007-08-23 ENCOUNTER — Telehealth (INDEPENDENT_AMBULATORY_CARE_PROVIDER_SITE_OTHER): Payer: Self-pay | Admitting: Internal Medicine

## 2007-08-27 ENCOUNTER — Telehealth (INDEPENDENT_AMBULATORY_CARE_PROVIDER_SITE_OTHER): Payer: Self-pay | Admitting: Internal Medicine

## 2007-08-28 ENCOUNTER — Ambulatory Visit: Payer: Self-pay | Admitting: Pulmonary Disease

## 2007-08-28 DIAGNOSIS — J310 Chronic rhinitis: Secondary | ICD-10-CM

## 2007-08-30 ENCOUNTER — Telehealth: Payer: Self-pay | Admitting: Pulmonary Disease

## 2007-08-31 ENCOUNTER — Telehealth (INDEPENDENT_AMBULATORY_CARE_PROVIDER_SITE_OTHER): Payer: Self-pay | Admitting: *Deleted

## 2007-09-03 ENCOUNTER — Encounter (INDEPENDENT_AMBULATORY_CARE_PROVIDER_SITE_OTHER): Payer: Self-pay | Admitting: Internal Medicine

## 2007-09-07 ENCOUNTER — Telehealth (INDEPENDENT_AMBULATORY_CARE_PROVIDER_SITE_OTHER): Payer: Self-pay | Admitting: Internal Medicine

## 2007-09-07 ENCOUNTER — Telehealth (INDEPENDENT_AMBULATORY_CARE_PROVIDER_SITE_OTHER): Payer: Self-pay | Admitting: *Deleted

## 2007-10-26 ENCOUNTER — Ambulatory Visit: Payer: Self-pay | Admitting: Family Medicine

## 2007-10-26 ENCOUNTER — Encounter (INDEPENDENT_AMBULATORY_CARE_PROVIDER_SITE_OTHER): Payer: Self-pay | Admitting: Internal Medicine

## 2007-10-26 DIAGNOSIS — R109 Unspecified abdominal pain: Secondary | ICD-10-CM | POA: Insufficient documentation

## 2007-10-30 ENCOUNTER — Telehealth (INDEPENDENT_AMBULATORY_CARE_PROVIDER_SITE_OTHER): Payer: Self-pay | Admitting: Internal Medicine

## 2007-11-13 ENCOUNTER — Telehealth (INDEPENDENT_AMBULATORY_CARE_PROVIDER_SITE_OTHER): Payer: Self-pay | Admitting: Internal Medicine

## 2007-11-13 DIAGNOSIS — M25559 Pain in unspecified hip: Secondary | ICD-10-CM | POA: Insufficient documentation

## 2007-11-20 ENCOUNTER — Encounter (INDEPENDENT_AMBULATORY_CARE_PROVIDER_SITE_OTHER): Payer: Self-pay | Admitting: Internal Medicine

## 2007-12-03 ENCOUNTER — Telehealth (INDEPENDENT_AMBULATORY_CARE_PROVIDER_SITE_OTHER): Payer: Self-pay | Admitting: Internal Medicine

## 2007-12-16 IMAGING — CR DG CHEST 2V
2 series · 2 of 2 positions shown · non-contrast
Comparison: 07/17/2006

CLINICAL DATA: Bronchitis. Pneumonia.

[view not recorded (1 of 2)]
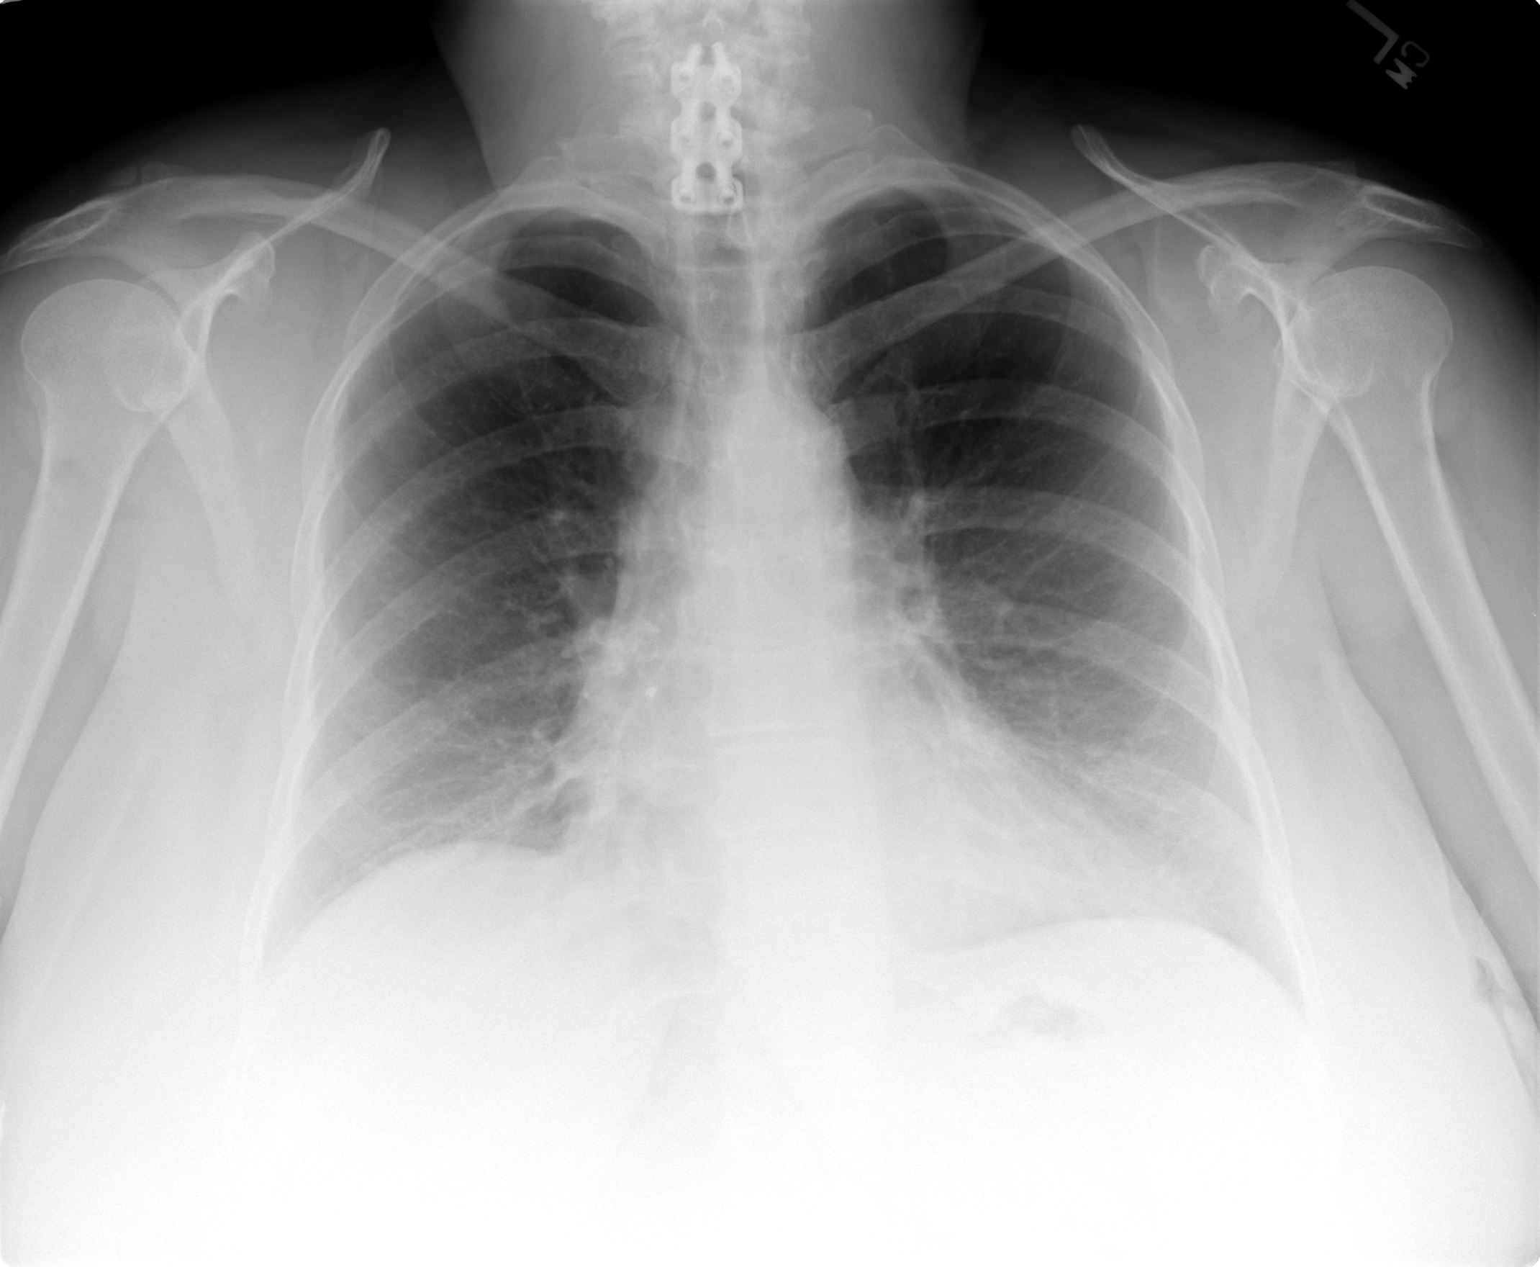

[view not recorded (2 of 2)]
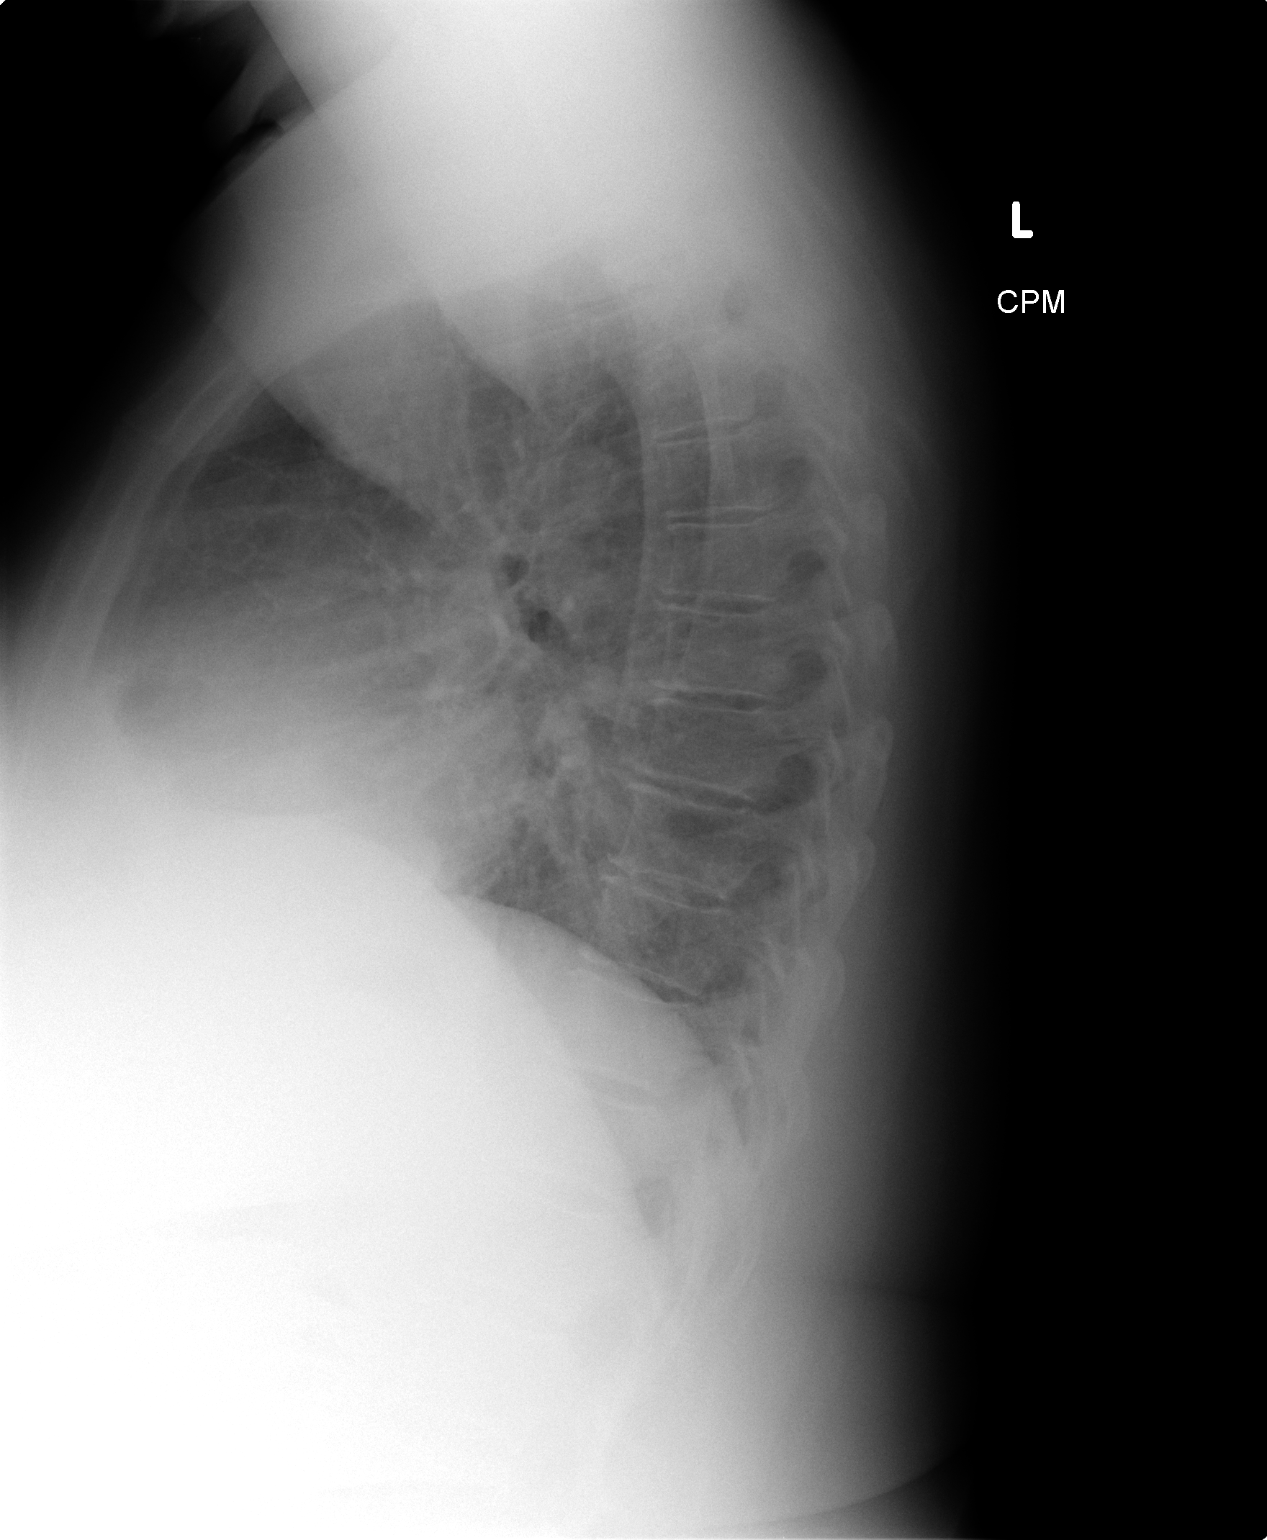

[2 of 2 positions shown; findings below may reference images not displayed]

CHEST - 2 VIEW:

   Two-view exam shows no focal consolidation, edema, or pleural effusion. There
is mild interstitial coarsening as before. Cardiopericardial silhouette is
within normal limits for size. Bony structures of the imaged thorax are intact.
Patient is status post lower cervical fusion.
IMPRESSION: Stable. No acute cardiopulmonary findings.

## 2007-12-20 ENCOUNTER — Encounter (INDEPENDENT_AMBULATORY_CARE_PROVIDER_SITE_OTHER): Payer: Self-pay | Admitting: Internal Medicine

## 2008-01-02 ENCOUNTER — Encounter: Admission: RE | Admit: 2008-01-02 | Discharge: 2008-01-02 | Payer: Self-pay | Admitting: Orthopaedic Surgery

## 2008-01-07 ENCOUNTER — Telehealth (INDEPENDENT_AMBULATORY_CARE_PROVIDER_SITE_OTHER): Payer: Self-pay | Admitting: Internal Medicine

## 2008-01-07 ENCOUNTER — Ambulatory Visit: Payer: Self-pay | Admitting: Family Medicine

## 2008-01-07 DIAGNOSIS — R609 Edema, unspecified: Secondary | ICD-10-CM

## 2008-01-08 ENCOUNTER — Ambulatory Visit: Payer: Self-pay | Admitting: Family Medicine

## 2008-01-16 ENCOUNTER — Telehealth (INDEPENDENT_AMBULATORY_CARE_PROVIDER_SITE_OTHER): Payer: Self-pay | Admitting: Internal Medicine

## 2008-01-21 LAB — CONVERTED CEMR LAB
ALT: 24 units/L (ref 0–35)
AST: 17 units/L (ref 0–37)
BUN: 19 mg/dL (ref 6–23)
Basophils Absolute: 0 10*3/uL (ref 0.0–0.1)
Basophils Relative: 0.2 % (ref 0.0–3.0)
CO2: 31 meq/L (ref 19–32)
Calcium: 9.3 mg/dL (ref 8.4–10.5)
Chloride: 100 meq/L (ref 96–112)
Cholesterol: 242 mg/dL (ref 0–200)
Creatinine, Ser: 0.9 mg/dL (ref 0.4–1.2)
Direct LDL: 139.4 mg/dL
Eosinophils Absolute: 0.1 10*3/uL (ref 0.0–0.7)
Eosinophils Relative: 0.7 % (ref 0.0–5.0)
GFR calc Af Amer: 83 mL/min
GFR calc non Af Amer: 69 mL/min
Glucose, Bld: 99 mg/dL (ref 70–99)
HCT: 43.2 % (ref 36.0–46.0)
HDL: 85.2 mg/dL (ref >39.0)
Hemoglobin: 15 g/dL (ref 12.0–15.0)
Lymphocytes Relative: 13 % (ref 12.0–46.0)
MCHC: 34.7 g/dL (ref 30.0–36.0)
MCV: 94.9 fL (ref 78.0–100.0)
Monocytes Absolute: 0.8 10*3/uL (ref 0.1–1.0)
Monocytes Relative: 6.1 % (ref 3.0–12.0)
Neutro Abs: 10.6 10*3/uL — ABNORMAL HIGH (ref 1.4–7.7)
Neutrophils Relative %: 80 % — ABNORMAL HIGH (ref 43.0–77.0)
Platelets: 224 10*3/uL (ref 150–400)
Potassium: 4 meq/L (ref 3.5–5.1)
RBC: 4.56 M/uL (ref 3.87–5.11)
RDW: 13.6 % (ref 11.5–14.6)
Sodium: 140 meq/L (ref 135–145)
TSH: 2.1 microintl units/mL (ref 0.35–5.50)
Total CHOL/HDL Ratio: 2.8
Triglycerides: 133 mg/dL (ref 0–149)
VLDL: 27 mg/dL (ref 0–40)
WBC: 13.2 10*3/uL — ABNORMAL HIGH (ref 4.5–10.5)

## 2008-02-26 ENCOUNTER — Ambulatory Visit: Payer: Self-pay | Admitting: Family Medicine

## 2008-02-26 DIAGNOSIS — L989 Disorder of the skin and subcutaneous tissue, unspecified: Secondary | ICD-10-CM | POA: Insufficient documentation

## 2008-03-06 HISTORY — PX: LUMBAR FUSION: SHX111

## 2008-03-12 ENCOUNTER — Encounter (INDEPENDENT_AMBULATORY_CARE_PROVIDER_SITE_OTHER): Payer: Self-pay | Admitting: Internal Medicine

## 2008-03-17 ENCOUNTER — Telehealth (INDEPENDENT_AMBULATORY_CARE_PROVIDER_SITE_OTHER): Payer: Self-pay | Admitting: Internal Medicine

## 2008-03-19 ENCOUNTER — Ambulatory Visit: Payer: Self-pay | Admitting: Internal Medicine

## 2008-03-19 IMAGING — CR DG CHEST 2V
1 series · 2 of 2 positions shown · non-contrast
Comparison: none

REASON FOR EXAM: chest pain - ed 7
COMMENTS:

PROCEDURE:     DXR - DXR CHEST PA (OR AP) AND LATERAL  - July 30, 2007  [DATE]
RESULT:     The lungs are adequately inflated. There is no focal infiltrate.
I see no evidence of a pleural effusion. The cardiac silhouette is top
normal in size. The pulmonary vascularity is not engorged.

[Series 1: view not recorded · 0.17mm/px · 2 of 2 slices shown]
[im 1/2]
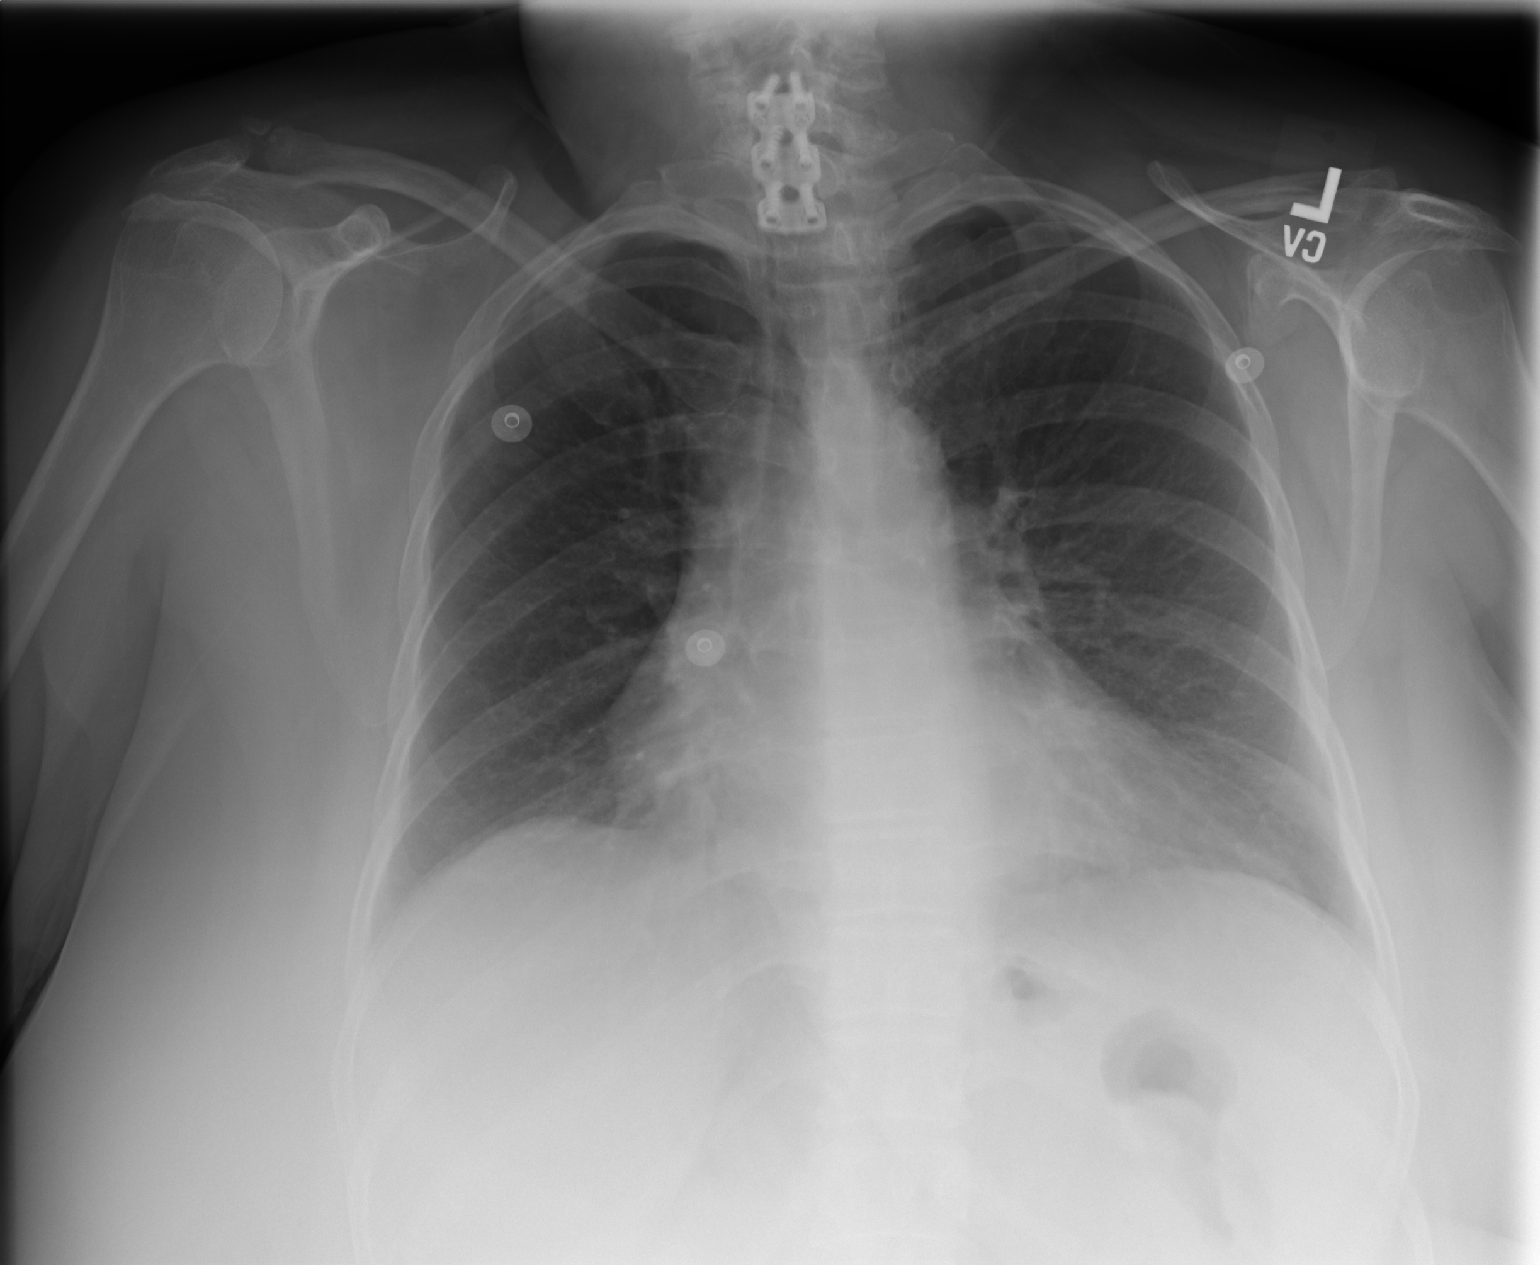
[im 2/2]
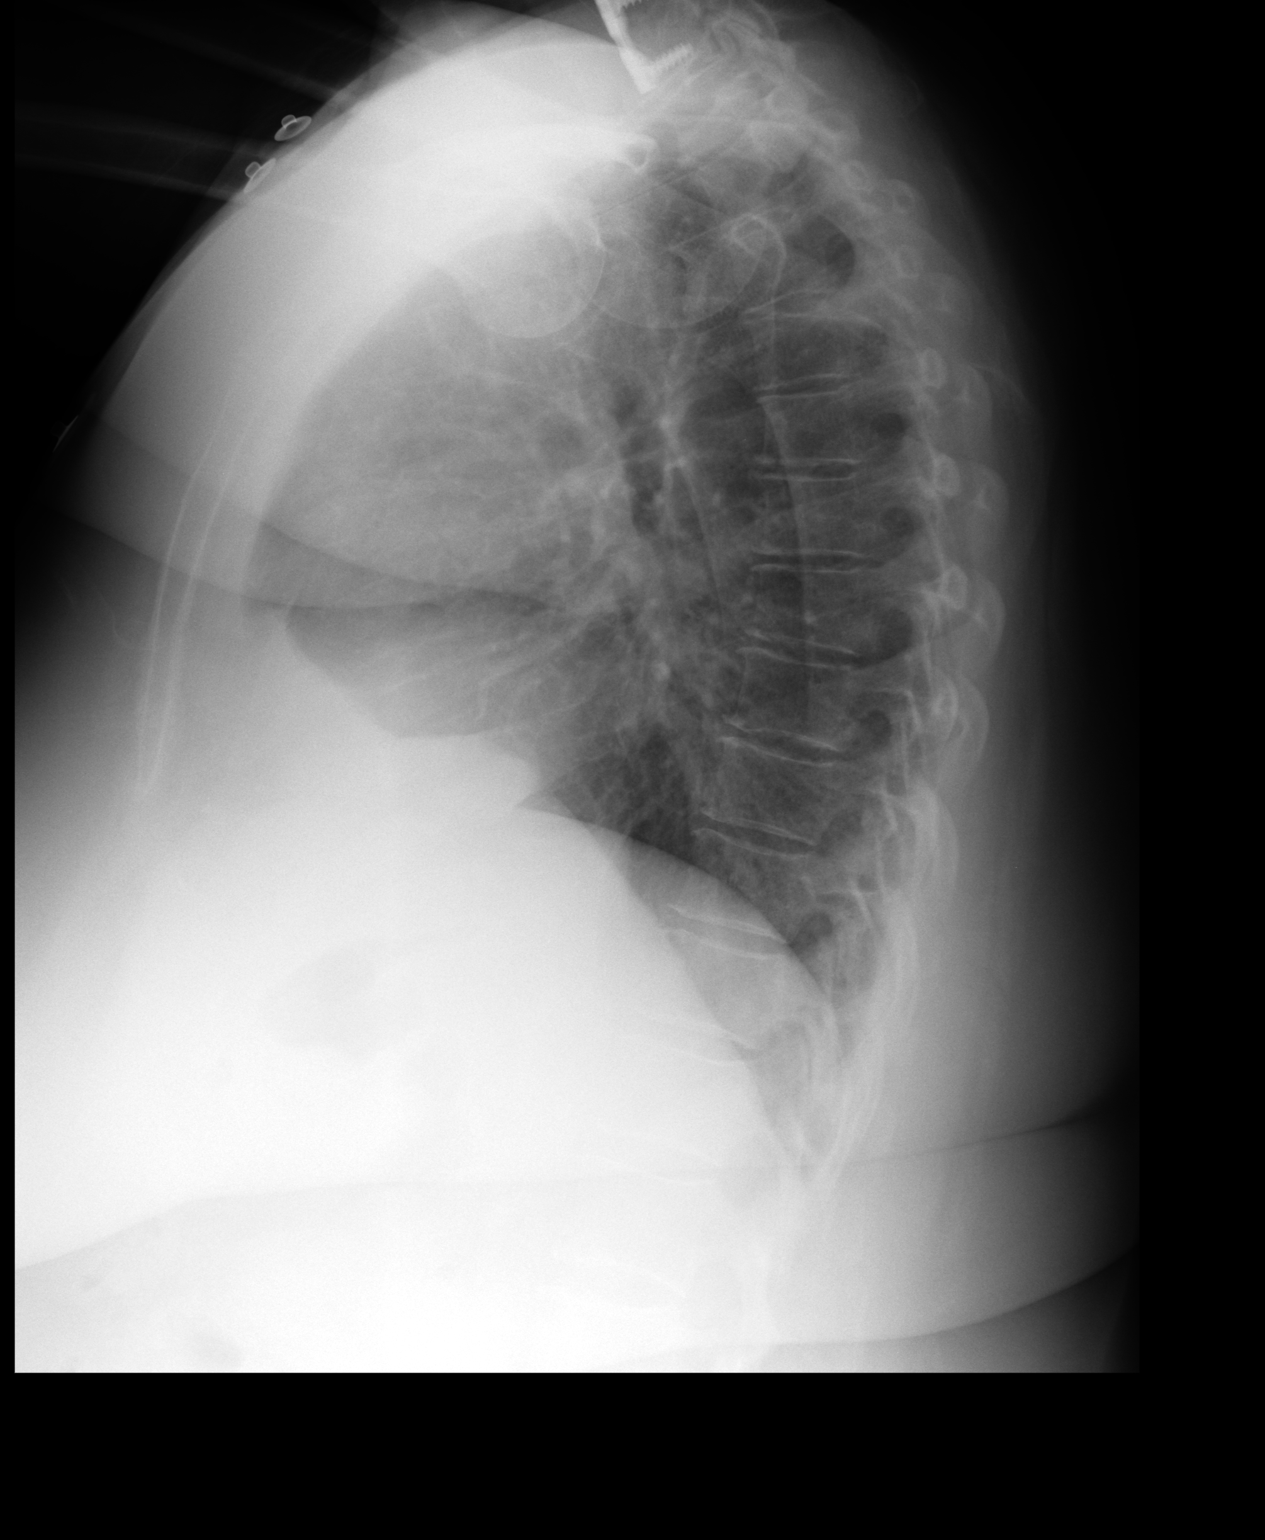

[2 of 2 positions shown; findings below may reference images not displayed]

IMPRESSION: I do not see objective evidence of acute cardiopulmonary disease. Followup
imaging is available if the patient's symptoms persist.

## 2008-04-08 DIAGNOSIS — K56609 Unspecified intestinal obstruction, unspecified as to partial versus complete obstruction: Secondary | ICD-10-CM | POA: Insufficient documentation

## 2008-04-09 ENCOUNTER — Encounter (INDEPENDENT_AMBULATORY_CARE_PROVIDER_SITE_OTHER): Payer: Self-pay | Admitting: Internal Medicine

## 2008-04-09 DIAGNOSIS — K760 Fatty (change of) liver, not elsewhere classified: Secondary | ICD-10-CM

## 2008-04-09 HISTORY — DX: Fatty (change of) liver, not elsewhere classified: K76.0

## 2008-04-11 ENCOUNTER — Encounter (INDEPENDENT_AMBULATORY_CARE_PROVIDER_SITE_OTHER): Payer: Self-pay | Admitting: Internal Medicine

## 2008-04-22 ENCOUNTER — Ambulatory Visit: Payer: Self-pay | Admitting: Family Medicine

## 2008-06-19 ENCOUNTER — Ambulatory Visit: Payer: Self-pay | Admitting: Family Medicine

## 2008-06-19 ENCOUNTER — Encounter (INDEPENDENT_AMBULATORY_CARE_PROVIDER_SITE_OTHER): Payer: Self-pay | Admitting: Internal Medicine

## 2008-06-19 DIAGNOSIS — F17200 Nicotine dependence, unspecified, uncomplicated: Secondary | ICD-10-CM

## 2008-06-20 LAB — CONVERTED CEMR LAB
ALT: 20 U/L
AST: 23 U/L
BUN: 14 mg/dL
Basophils Absolute: 0 K/uL
Basophils Relative: 0.4 %
CO2: 29 meq/L
Calcium: 9.2 mg/dL
Chloride: 109 meq/L
Cholesterol: 219 mg/dL
Creatinine, Ser: 0.8 mg/dL
Direct LDL: 127 mg/dL
Eosinophils Absolute: 0.1 K/uL
Eosinophils Relative: 1.6 %
GFR calc Af Amer: 95 mL/min
GFR calc non Af Amer: 79 mL/min
Glucose, Bld: 106 mg/dL — ABNORMAL HIGH
HCT: 39.2 %
HDL: 69.6 mg/dL
Hemoglobin: 13.6 g/dL
Lymphocytes Relative: 23.1 %
MCHC: 34.8 g/dL
MCV: 91.3 fL
Monocytes Absolute: 0.5 K/uL
Monocytes Relative: 8.7 %
Neutro Abs: 3.6 K/uL
Neutrophils Relative %: 66.2 %
Platelets: 172 K/uL
Potassium: 4.3 meq/L
RBC: 4.29 M/uL
RDW: 12.9 %
Sodium: 143 meq/L
TSH: 1.49 u[IU]/mL
Total CHOL/HDL Ratio: 3.1
Triglycerides: 125 mg/dL
VLDL: 25 mg/dL
WBC: 5.4 10*3/microliter

## 2008-06-25 ENCOUNTER — Telehealth (INDEPENDENT_AMBULATORY_CARE_PROVIDER_SITE_OTHER): Payer: Self-pay | Admitting: Internal Medicine

## 2008-07-02 ENCOUNTER — Telehealth (INDEPENDENT_AMBULATORY_CARE_PROVIDER_SITE_OTHER): Payer: Self-pay | Admitting: Internal Medicine

## 2008-07-04 ENCOUNTER — Telehealth (INDEPENDENT_AMBULATORY_CARE_PROVIDER_SITE_OTHER): Payer: Self-pay | Admitting: Internal Medicine

## 2008-07-18 ENCOUNTER — Ambulatory Visit: Payer: Self-pay | Admitting: Family Medicine

## 2008-07-18 ENCOUNTER — Encounter (INDEPENDENT_AMBULATORY_CARE_PROVIDER_SITE_OTHER): Payer: Self-pay | Admitting: Internal Medicine

## 2008-07-22 ENCOUNTER — Telehealth (INDEPENDENT_AMBULATORY_CARE_PROVIDER_SITE_OTHER): Payer: Self-pay | Admitting: Internal Medicine

## 2008-07-22 DIAGNOSIS — R928 Other abnormal and inconclusive findings on diagnostic imaging of breast: Secondary | ICD-10-CM | POA: Insufficient documentation

## 2008-07-23 ENCOUNTER — Encounter: Admission: RE | Admit: 2008-07-23 | Discharge: 2008-07-23 | Payer: Self-pay | Admitting: Orthopaedic Surgery

## 2008-07-25 ENCOUNTER — Ambulatory Visit: Payer: Self-pay | Admitting: Family Medicine

## 2008-07-25 ENCOUNTER — Encounter: Payer: Self-pay | Admitting: Family Medicine

## 2008-07-30 ENCOUNTER — Telehealth: Payer: Self-pay | Admitting: Family Medicine

## 2008-08-08 ENCOUNTER — Encounter: Admission: RE | Admit: 2008-08-08 | Discharge: 2008-08-08 | Payer: Self-pay | Admitting: Orthopaedic Surgery

## 2008-08-08 ENCOUNTER — Encounter (INDEPENDENT_AMBULATORY_CARE_PROVIDER_SITE_OTHER): Payer: Self-pay | Admitting: *Deleted

## 2008-08-19 ENCOUNTER — Telehealth (INDEPENDENT_AMBULATORY_CARE_PROVIDER_SITE_OTHER): Payer: Self-pay | Admitting: Internal Medicine

## 2008-09-19 ENCOUNTER — Telehealth (INDEPENDENT_AMBULATORY_CARE_PROVIDER_SITE_OTHER): Payer: Self-pay | Admitting: Internal Medicine

## 2008-09-26 ENCOUNTER — Ambulatory Visit: Payer: Self-pay | Admitting: Family Medicine

## 2008-09-26 ENCOUNTER — Encounter (INDEPENDENT_AMBULATORY_CARE_PROVIDER_SITE_OTHER): Payer: Self-pay | Admitting: Internal Medicine

## 2008-09-26 DIAGNOSIS — R109 Unspecified abdominal pain: Secondary | ICD-10-CM | POA: Insufficient documentation

## 2008-09-26 DIAGNOSIS — E559 Vitamin D deficiency, unspecified: Secondary | ICD-10-CM | POA: Insufficient documentation

## 2008-09-26 DIAGNOSIS — L738 Other specified follicular disorders: Secondary | ICD-10-CM

## 2008-10-01 LAB — CONVERTED CEMR LAB: Vit D, 25-Hydroxy: 19 ng/mL — ABNORMAL LOW (ref 30–89)

## 2008-10-06 ENCOUNTER — Encounter (INDEPENDENT_AMBULATORY_CARE_PROVIDER_SITE_OTHER): Payer: Self-pay | Admitting: Internal Medicine

## 2008-10-16 ENCOUNTER — Telehealth (INDEPENDENT_AMBULATORY_CARE_PROVIDER_SITE_OTHER): Payer: Self-pay | Admitting: Internal Medicine

## 2008-10-23 ENCOUNTER — Telehealth: Payer: Self-pay | Admitting: Gastroenterology

## 2008-10-23 ENCOUNTER — Ambulatory Visit: Payer: Self-pay | Admitting: Gastroenterology

## 2008-10-23 DIAGNOSIS — K581 Irritable bowel syndrome with constipation: Secondary | ICD-10-CM

## 2008-10-23 DIAGNOSIS — R110 Nausea: Secondary | ICD-10-CM | POA: Insufficient documentation

## 2008-10-23 DIAGNOSIS — Z860100 Personal history of colon polyps, unspecified: Secondary | ICD-10-CM | POA: Insufficient documentation

## 2008-10-30 ENCOUNTER — Ambulatory Visit: Payer: Self-pay | Admitting: Gastroenterology

## 2008-10-31 ENCOUNTER — Telehealth: Payer: Self-pay | Admitting: Gastroenterology

## 2008-11-13 ENCOUNTER — Encounter: Payer: Self-pay | Admitting: Gastroenterology

## 2008-11-13 ENCOUNTER — Ambulatory Visit (HOSPITAL_COMMUNITY): Admission: RE | Admit: 2008-11-13 | Discharge: 2008-11-13 | Payer: Self-pay | Admitting: Gastroenterology

## 2008-11-25 ENCOUNTER — Ambulatory Visit: Payer: Self-pay | Admitting: Gastroenterology

## 2008-12-16 ENCOUNTER — Ambulatory Visit: Payer: Self-pay | Admitting: Family Medicine

## 2008-12-16 DIAGNOSIS — T50995A Adverse effect of other drugs, medicaments and biological substances, initial encounter: Secondary | ICD-10-CM | POA: Insufficient documentation

## 2008-12-17 LAB — CONVERTED CEMR LAB
Alkaline Phosphatase: 118 units/L — ABNORMAL HIGH (ref 39–117)
BUN: 13 mg/dL (ref 6–23)
Basophils Relative: 0.3 % (ref 0.0–3.0)
Bilirubin, Direct: 0.1 mg/dL (ref 0.0–0.3)
CO2: 27 meq/L (ref 19–32)
Calcium: 9.1 mg/dL (ref 8.4–10.5)
Creatinine, Ser: 0.8 mg/dL (ref 0.4–1.2)
Eosinophils Absolute: 0.1 10*3/uL (ref 0.0–0.7)
Eosinophils Relative: 1.4 % (ref 0.0–5.0)
GFR calc non Af Amer: 78.22 mL/min (ref >60)
Glucose, Bld: 106 mg/dL — ABNORMAL HIGH (ref 70–99)
Hemoglobin: 13.9 g/dL (ref 12.0–15.0)
Lymphs Abs: 1.5 10*3/uL (ref 0.7–4.0)
MCV: 91.4 fL (ref 78.0–100.0)
Monocytes Absolute: 0.6 10*3/uL (ref 0.1–1.0)
Neutro Abs: 5.2 10*3/uL (ref 1.4–7.7)
Neutrophils Relative %: 70.2 % (ref 43.0–77.0)
RDW: 12.7 % (ref 11.5–14.6)
Total Bilirubin: 0.4 mg/dL (ref 0.3–1.2)
WBC: 7.4 10*3/uL (ref 4.5–10.5)

## 2009-01-05 ENCOUNTER — Ambulatory Visit: Payer: Self-pay | Admitting: Pulmonary Disease

## 2009-01-12 ENCOUNTER — Encounter: Payer: Self-pay | Admitting: Pulmonary Disease

## 2009-01-13 ENCOUNTER — Encounter: Payer: Self-pay | Admitting: Pulmonary Disease

## 2009-01-22 ENCOUNTER — Ambulatory Visit: Payer: Self-pay | Admitting: Family Medicine

## 2009-01-22 ENCOUNTER — Encounter (INDEPENDENT_AMBULATORY_CARE_PROVIDER_SITE_OTHER): Payer: Self-pay | Admitting: Internal Medicine

## 2009-01-23 HISTORY — PX: OTHER SURGICAL HISTORY: SHX169

## 2009-02-01 ENCOUNTER — Ambulatory Visit (HOSPITAL_BASED_OUTPATIENT_CLINIC_OR_DEPARTMENT_OTHER): Admission: RE | Admit: 2009-02-01 | Discharge: 2009-02-01 | Payer: Self-pay | Admitting: Pulmonary Disease

## 2009-02-01 ENCOUNTER — Encounter: Payer: Self-pay | Admitting: Pulmonary Disease

## 2009-02-02 ENCOUNTER — Telehealth: Payer: Self-pay | Admitting: Pulmonary Disease

## 2009-02-02 ENCOUNTER — Ambulatory Visit: Payer: Self-pay | Admitting: Pulmonary Disease

## 2009-03-19 ENCOUNTER — Ambulatory Visit: Payer: Self-pay | Admitting: Family Medicine

## 2009-03-29 IMAGING — RF DG MYELOGRAM LUMBAR
13 series · 13 of 13 positions shown · IV contrast (omnipaque)
Comparison: Preoperative MRI

CLINICAL DATA: Prior fusion L3-S1.  L3-4 level most recently
performed in [REDACTED] of last year.  Back pain and bilateral hip
pain.

 MYELOGRAM INJECTION
TECHNIQUE: Informed consent was obtained from the patient prior to
the procedure, including potential complications of headache,
allergy, infection and pain.  A timeout procedure was performed.
With the patient prone, the lower back was prepped with Betadine.
1% Lidocaine was used for local anesthesia.  Lumbar puncture was
performed at the right L1-9level using a 22 gauge needle with
return of clear CSF.  18cc of Omnipaque 462was injected into the
subarachnoid space . This is a partially mixed injection with some
contrast in the dorsal subdural or epidural space.  There is a
sufficient amount of subarachnoid contrast for diagnosis.
TECHNIQUE: Following injection of intrathecal Omnipaque contrast,
spine imaging in multiple projections was performed using
fluoroscopy.
Fluoroscopy Time: 2 minutes 13 seconds .
TECHNIQUE: CT imaging of the lumbar spine was performed after
intrathecal contrast administration.  Multiplanar CT image
reconstructions were also generated.

[Series 2: (hospital) · 1 of 1 slices shown]
[im 1/1]
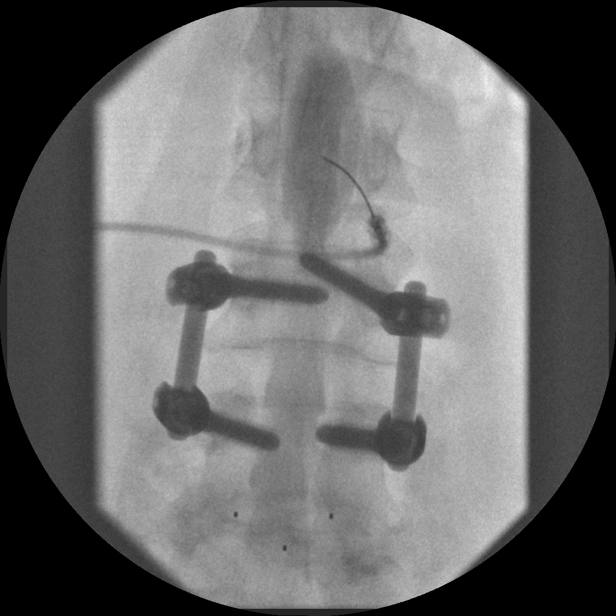

[Series 3: myelogram  white · 1 of 1 slices shown (1 of 12)]
[im 1/1]
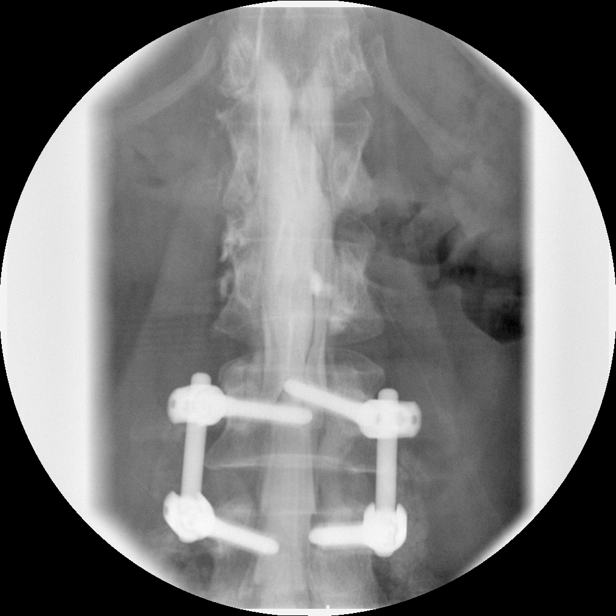

[Series 4: myelogram  white · 1 of 1 slices shown (2 of 12)]
[im 1/1]
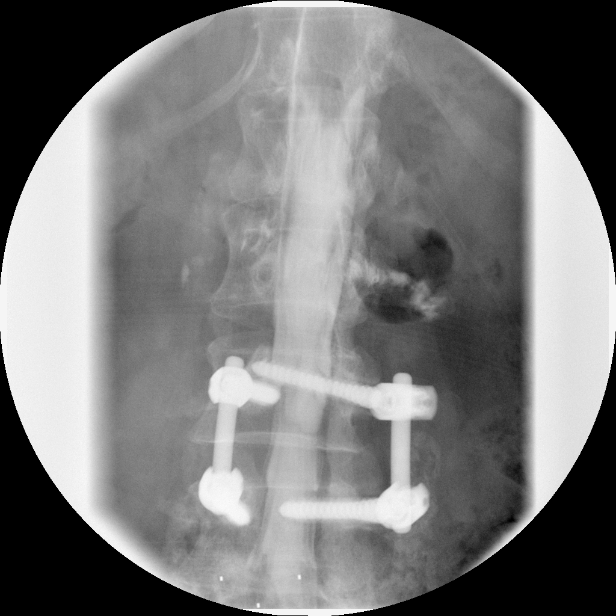

[Series 5: myelogram  white · 1 of 1 slices shown (3 of 12)]
[im 1/1]
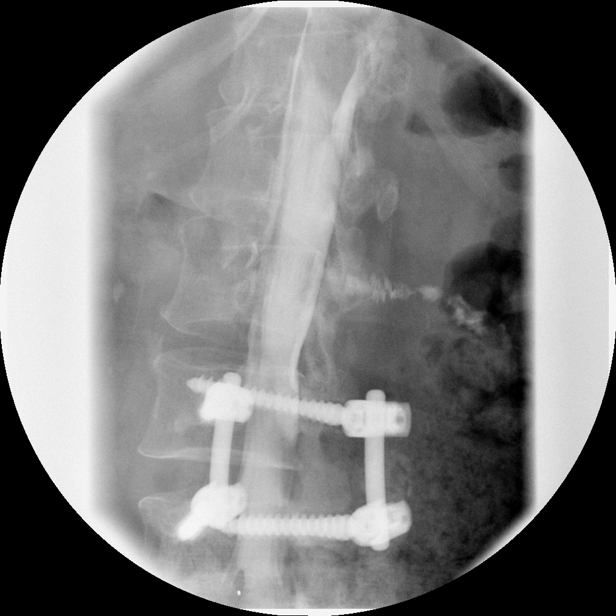

[Series 6: myelogram  white · 1 of 1 slices shown (4 of 12)]
[im 1/1]
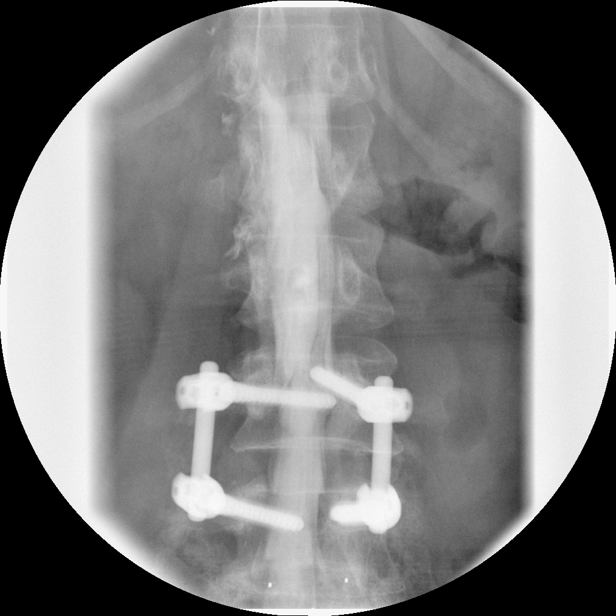

[Series 7: myelogram  white · 1 of 1 slices shown (5 of 12)]
[im 1/1]
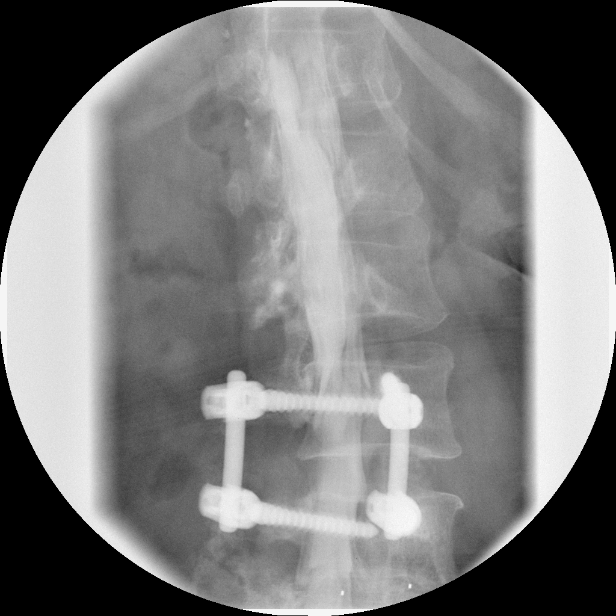

[Series 8: myelogram  white · 1 of 1 slices shown (6 of 12)]
[im 1/1]
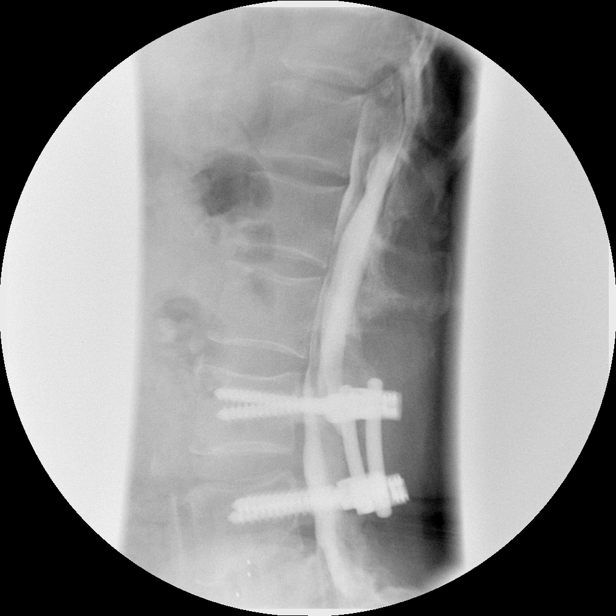

[Series 9: myelogram  white · 1 of 1 slices shown (7 of 12)]
[im 1/1]
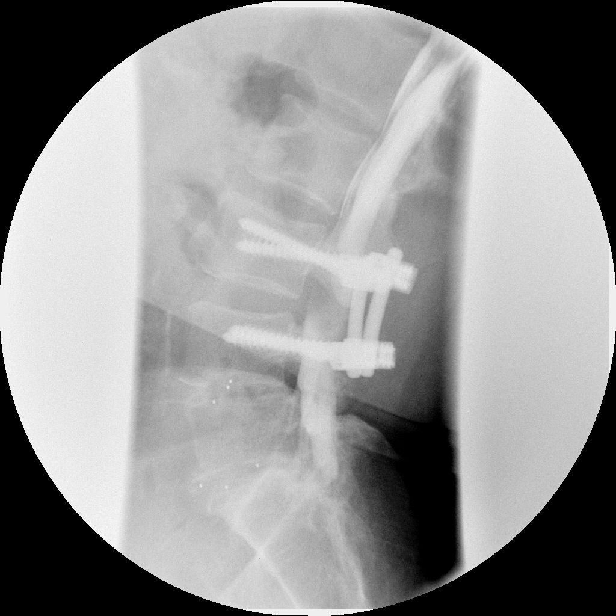

[Series 10: myelogram  white · 1 of 1 slices shown (8 of 12)]
[im 1/1]
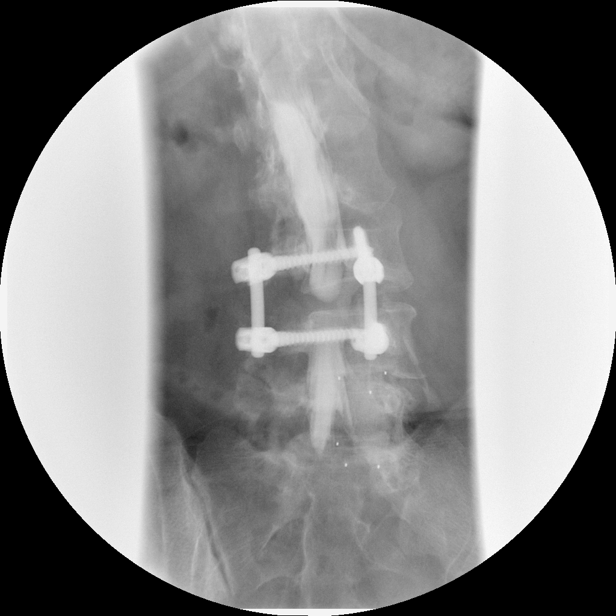

[Series 11: myelogram  white · 1 of 1 slices shown (9 of 12)]
[im 1/1]
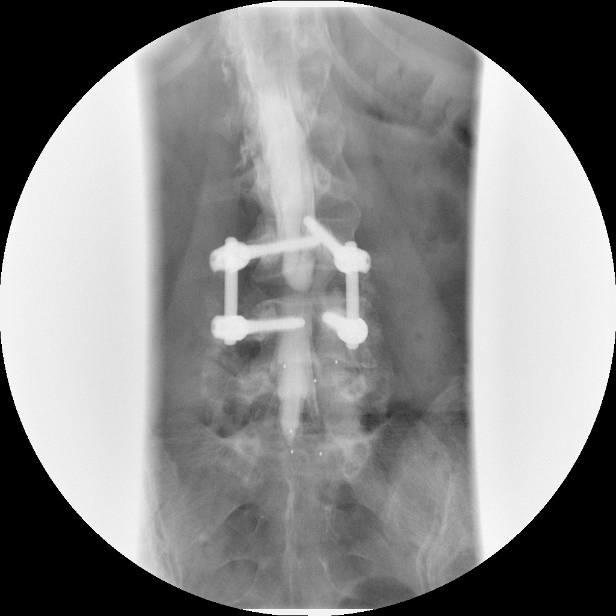

[Series 12: myelogram  white · 1 of 1 slices shown (10 of 12)]
[im 1/1]
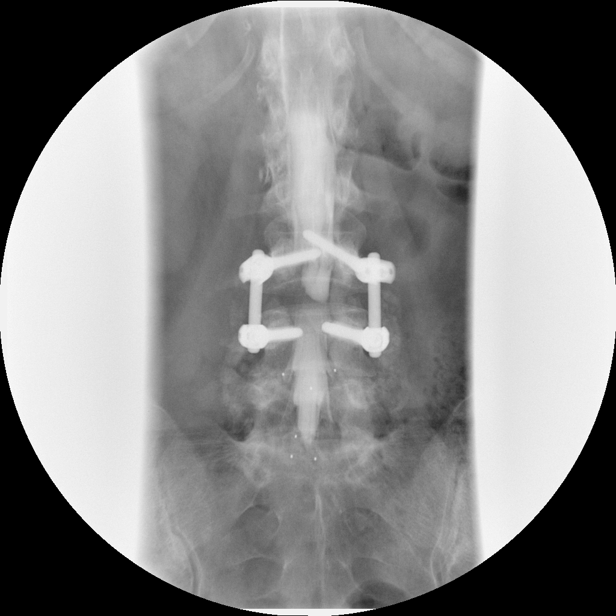

[Series 13: myelogram  white · 1 of 1 slices shown (11 of 12)]
[im 1/1]
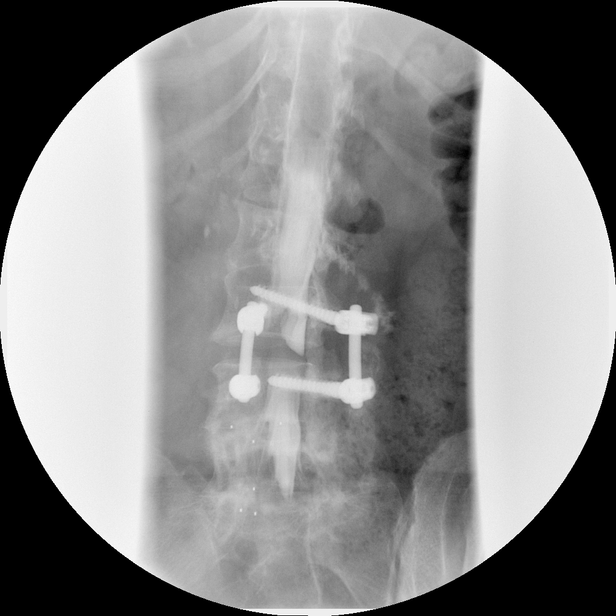

[Series 14: myelogram  white · 1 of 1 slices shown (12 of 12)]
[im 1/1]
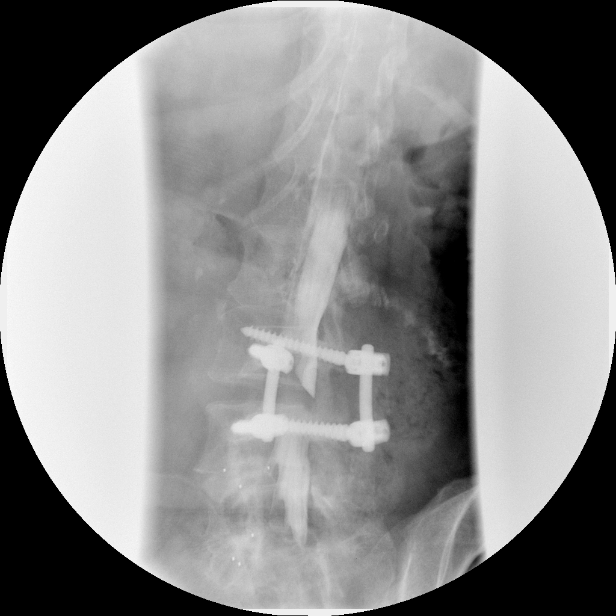

[13 of 13 positions shown; findings below may reference images not displayed]

IMPRESSION: Successful injection of  intrathecal contrast for myelography.
Partially mixed injection.

MYELOGRAM LUMBAR
FINDINGS: No stenosis is noted at L2-3 or above.  At L3-4, there
appears be wide patency of the canal.  Alignment appears normal.
The previous fusion segment from L4 the sacrum, the canal appears
widely patent without evidence of foraminal encroachment.

Standing lateral flexion and extension views show a small anterior
extradural defect at L2-3.  There may be slight rocking motion at
this level.  No subluxation occurs.  I think there is slight
rocking motion at the L3-4 level, suggesting possible loosening.
See results of CT scan.
IMPRESSION: Small anterior extradural defect at L2-3 noticed on the standing
films.  Slight rocking motion with flexion and extension.

Apparent slight rocking motion at the L3-4 level raising the
question of loosening.

No apparent compressive stenosis.

CT MYELOGRAPHY LUMBAR SPINE
FINDINGS: L1-2:  Normal interspace.

L2-3:  Very minimal disc bulge.  Mild facet degeneration.  No
compressive stenosis.  L2 nerve roots exit freely.

L3-4:  Previous posterior decompression and fusion.  L3 screws
appear unremarkable.  There is slight lucency around the L4 screw
on the right but not apparently on the left.  The spinal canal is
well decompressed.  No foraminal stenosis. There are healing
transverse process fractures bilaterally.

L4 to sacrum:  Previous solid fusion with wide patency of the canal
and foramina.  Screw shadows are evident to at L5 and S1.
IMPRESSION: Solid fusion from L4 to the sacrum with wide patency of the canal
and foramina

Recent posterior decompression at L3-4 with pedicle screws and
posterior rods.  There is slight lucency around the pedicle screw
on the right at L4 but not apparently on the left.  Canal and
foramina appear widely patent.  Healing transverse process
fractures at L3.

Mild disc bulge and facet degeneration at L2-3 without apparent
compressive stenosis.

## 2009-03-29 IMAGING — CT CT L SPINE W/ CM
3 of 9 series · 10 of 27 positions shown, 11 images · IV contrast (omnipaque)
Comparison: Preoperative MRI

CLINICAL DATA: Prior fusion L3-S1.  L3-4 level most recently
performed in [REDACTED] of last year.  Back pain and bilateral hip
pain.

 MYELOGRAM INJECTION
TECHNIQUE: Informed consent was obtained from the patient prior to
the procedure, including potential complications of headache,
allergy, infection and pain.  A timeout procedure was performed.
With the patient prone, the lower back was prepped with Betadine.
1% Lidocaine was used for local anesthesia.  Lumbar puncture was
performed at the right L1-9level using a 22 gauge needle with
return of clear CSF.  18cc of Omnipaque 462was injected into the
subarachnoid space . This is a partially mixed injection with some
contrast in the dorsal subdural or epidural space.  There is a
sufficient amount of subarachnoid contrast for diagnosis.
TECHNIQUE: Following injection of intrathecal Omnipaque contrast,
spine imaging in multiple projections was performed using
fluoroscopy.
Fluoroscopy Time: 2 minutes 13 seconds .
TECHNIQUE: CT imaging of the lumbar spine was performed after
intrathecal contrast administration.  Multiplanar CT image
reconstructions were also generated.

[Series 2: l-spine helical · axial · 0.27mm/px · z∈[-23,+34]mm · 2 of 71 slices shown]
[im 24/71  bone]
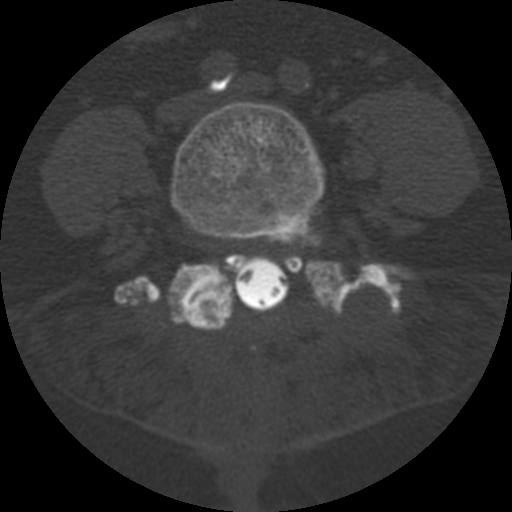
[im 47/71  bone]
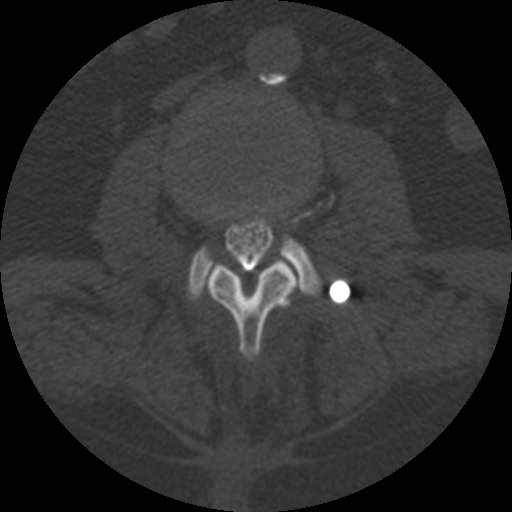

[Series 3: bone windows · axial · 0.27mm/px · z∈[-38,+50]mm · 3 of 71 slices shown, 4 images]
[im 18/71  soft-tissue]
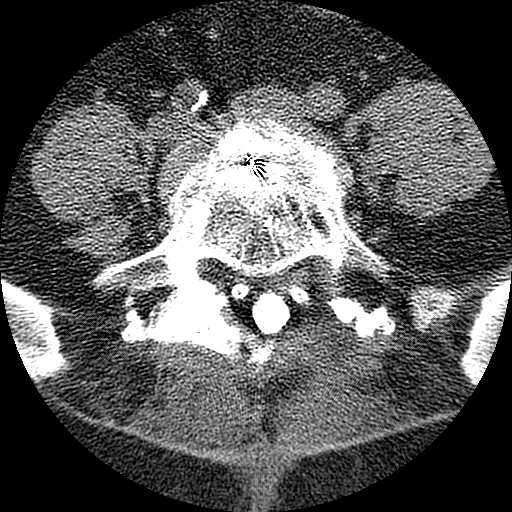
[im 18/71  bone]
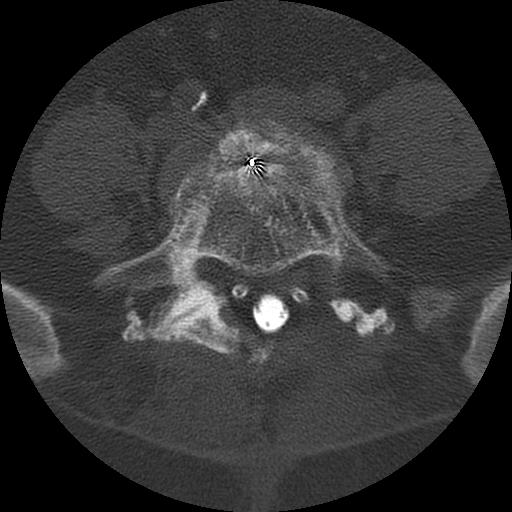
[im 36/71  bone]
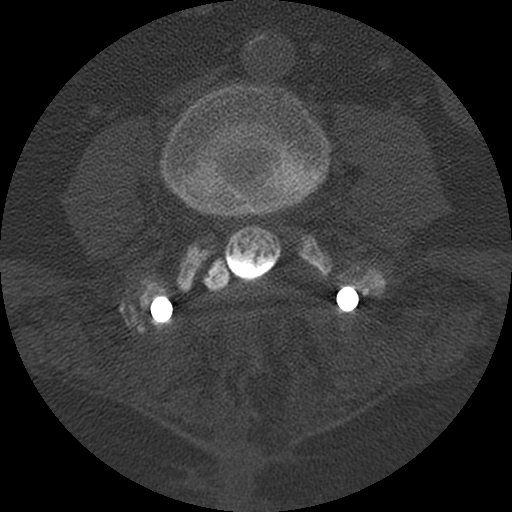
[im 53/71  bone]
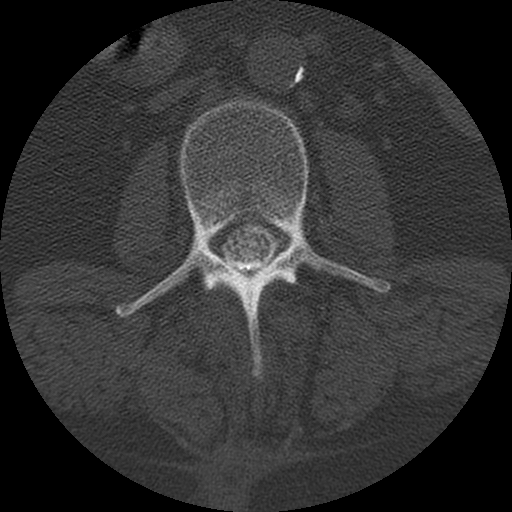

[Series 400: sag · sagittal · 0.35mm/px · 5 of 40 slices shown]
[im 7/40  bone]
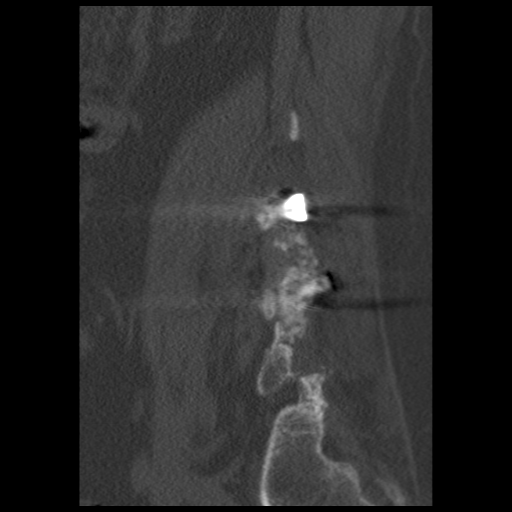
[im 14/40  bone]
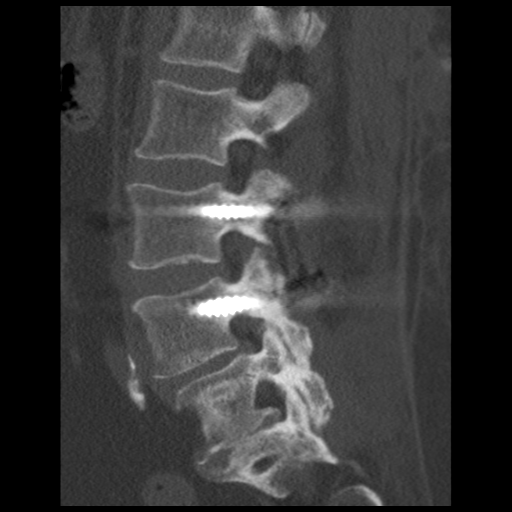
[im 20/40  bone]
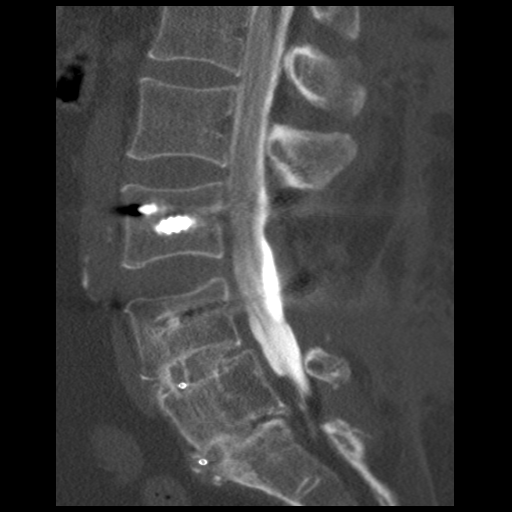
[im 27/40  bone]
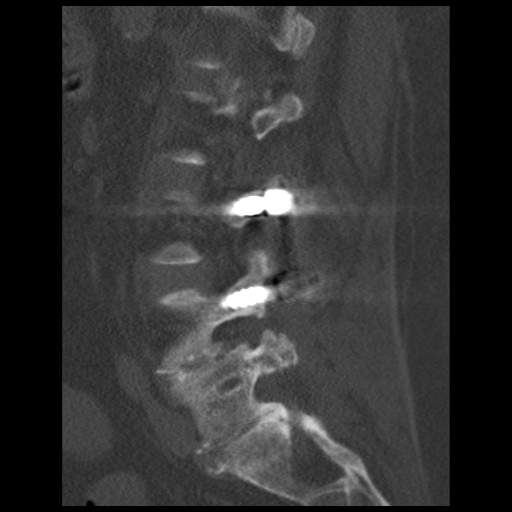
[im 33/40  bone]
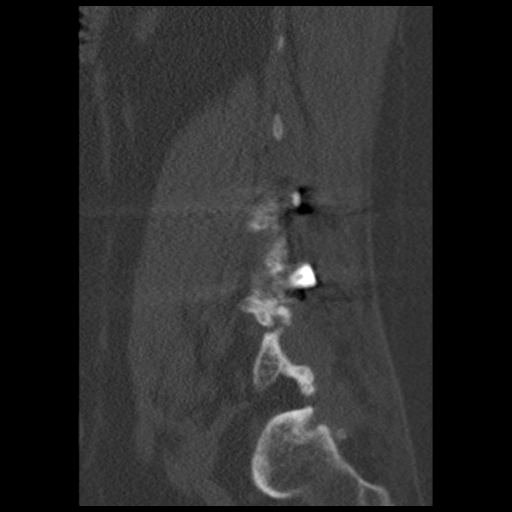

[10 of 27 positions shown; findings below may reference images not displayed]

IMPRESSION: Successful injection of  intrathecal contrast for myelography.
Partially mixed injection.

MYELOGRAM LUMBAR
FINDINGS: No stenosis is noted at L2-3 or above.  At L3-4, there
appears be wide patency of the canal.  Alignment appears normal.
The previous fusion segment from L4 the sacrum, the canal appears
widely patent without evidence of foraminal encroachment.

Standing lateral flexion and extension views show a small anterior
extradural defect at L2-3.  There may be slight rocking motion at
this level.  No subluxation occurs.  I think there is slight
rocking motion at the L3-4 level, suggesting possible loosening.
See results of CT scan.
IMPRESSION: Small anterior extradural defect at L2-3 noticed on the standing
films.  Slight rocking motion with flexion and extension.

Apparent slight rocking motion at the L3-4 level raising the
question of loosening.

No apparent compressive stenosis.

CT MYELOGRAPHY LUMBAR SPINE
FINDINGS: L1-2:  Normal interspace.

L2-3:  Very minimal disc bulge.  Mild facet degeneration.  No
compressive stenosis.  L2 nerve roots exit freely.

L3-4:  Previous posterior decompression and fusion.  L3 screws
appear unremarkable.  There is slight lucency around the L4 screw
on the right but not apparently on the left.  The spinal canal is
well decompressed.  No foraminal stenosis. There are healing
transverse process fractures bilaterally.

L4 to sacrum:  Previous solid fusion with wide patency of the canal
and foramina.  Screw shadows are evident to at L5 and S1.
IMPRESSION: Solid fusion from L4 to the sacrum with wide patency of the canal
and foramina

Recent posterior decompression at L3-4 with pedicle screws and
posterior rods.  There is slight lucency around the pedicle screw
on the right at L4 but not apparently on the left.  Canal and
foramina appear widely patent.  Healing transverse process
fractures at L3.

Mild disc bulge and facet degeneration at L2-3 without apparent
compressive stenosis.

## 2009-04-16 ENCOUNTER — Telehealth (INDEPENDENT_AMBULATORY_CARE_PROVIDER_SITE_OTHER): Payer: Self-pay | Admitting: Internal Medicine

## 2009-07-13 ENCOUNTER — Ambulatory Visit: Payer: Self-pay | Admitting: Family Medicine

## 2009-07-13 DIAGNOSIS — L03818 Cellulitis of other sites: Secondary | ICD-10-CM

## 2009-07-13 DIAGNOSIS — L02818 Cutaneous abscess of other sites: Secondary | ICD-10-CM

## 2009-07-14 LAB — CONVERTED CEMR LAB
Albumin: 3.9 g/dL (ref 3.5–5.2)
Bilirubin, Direct: 0 mg/dL (ref 0.0–0.3)
Cholesterol: 215 mg/dL — ABNORMAL HIGH (ref 0–200)
Direct LDL: 126.9 mg/dL
Triglycerides: 177 mg/dL — ABNORMAL HIGH (ref 0.0–149.0)
VLDL: 35.4 mg/dL (ref 0.0–40.0)

## 2009-07-24 ENCOUNTER — Ambulatory Visit: Payer: Self-pay | Admitting: Pulmonary Disease

## 2009-08-29 ENCOUNTER — Inpatient Hospital Stay: Payer: Self-pay | Admitting: Specialist

## 2009-08-29 ENCOUNTER — Encounter: Payer: Self-pay | Admitting: Family Medicine

## 2009-09-04 ENCOUNTER — Encounter: Payer: Self-pay | Admitting: Family Medicine

## 2009-09-05 ENCOUNTER — Encounter: Payer: Self-pay | Admitting: Family Medicine

## 2009-10-06 ENCOUNTER — Telehealth: Payer: Self-pay | Admitting: Family Medicine

## 2009-10-08 ENCOUNTER — Ambulatory Visit: Payer: Self-pay | Admitting: Family Medicine

## 2009-10-09 ENCOUNTER — Encounter: Payer: Self-pay | Admitting: Family Medicine

## 2009-11-06 ENCOUNTER — Ambulatory Visit: Payer: Self-pay | Admitting: Family Medicine

## 2009-11-06 DIAGNOSIS — G894 Chronic pain syndrome: Secondary | ICD-10-CM | POA: Insufficient documentation

## 2009-11-10 ENCOUNTER — Telehealth: Payer: Self-pay | Admitting: Family Medicine

## 2009-11-16 ENCOUNTER — Ambulatory Visit: Payer: Self-pay | Admitting: Family Medicine

## 2009-11-16 ENCOUNTER — Encounter: Payer: Self-pay | Admitting: Family Medicine

## 2009-11-18 ENCOUNTER — Encounter: Admission: RE | Admit: 2009-11-18 | Discharge: 2009-11-18 | Payer: Self-pay | Admitting: Family Medicine

## 2009-11-19 DIAGNOSIS — M545 Low back pain: Secondary | ICD-10-CM

## 2009-12-03 ENCOUNTER — Telehealth: Payer: Self-pay | Admitting: Family Medicine

## 2009-12-04 ENCOUNTER — Telehealth: Payer: Self-pay | Admitting: Family Medicine

## 2009-12-15 ENCOUNTER — Telehealth: Payer: Self-pay | Admitting: Family Medicine

## 2009-12-25 ENCOUNTER — Telehealth: Payer: Self-pay | Admitting: Family Medicine

## 2010-01-01 ENCOUNTER — Encounter: Payer: Self-pay | Admitting: Family Medicine

## 2010-01-01 ENCOUNTER — Ambulatory Visit: Payer: Self-pay | Admitting: Family Medicine

## 2010-01-05 ENCOUNTER — Telehealth: Payer: Self-pay | Admitting: Family Medicine

## 2010-01-25 ENCOUNTER — Ambulatory Visit: Payer: Self-pay | Admitting: Pulmonary Disease

## 2010-02-01 ENCOUNTER — Telehealth: Payer: Self-pay | Admitting: Gastroenterology

## 2010-02-02 ENCOUNTER — Telehealth: Payer: Self-pay | Admitting: Family Medicine

## 2010-02-15 ENCOUNTER — Telehealth: Payer: Self-pay | Admitting: Family Medicine

## 2010-02-17 ENCOUNTER — Telehealth (INDEPENDENT_AMBULATORY_CARE_PROVIDER_SITE_OTHER): Payer: Self-pay | Admitting: *Deleted

## 2010-02-17 ENCOUNTER — Ambulatory Visit: Payer: Self-pay | Admitting: Family Medicine

## 2010-02-17 LAB — CONVERTED CEMR LAB
BUN: 24 mg/dL — ABNORMAL HIGH (ref 6–23)
Calcium: 9.6 mg/dL (ref 8.4–10.5)
Direct LDL: 138.6 mg/dL
HDL: 91.8 mg/dL (ref >39.00)
Sodium: 142 meq/L (ref 135–145)

## 2010-02-18 ENCOUNTER — Encounter: Payer: Self-pay | Admitting: Orthopedic Surgery

## 2010-02-24 ENCOUNTER — Ambulatory Visit: Payer: Self-pay | Admitting: Family Medicine

## 2010-02-24 ENCOUNTER — Other Ambulatory Visit: Admission: RE | Admit: 2010-02-24 | Discharge: 2010-02-24 | Payer: Self-pay | Admitting: Family Medicine

## 2010-02-24 LAB — HM PAP SMEAR

## 2010-03-01 ENCOUNTER — Encounter: Payer: Self-pay | Admitting: Family Medicine

## 2010-03-06 ENCOUNTER — Encounter: Payer: Self-pay | Admitting: Orthopedic Surgery

## 2010-03-11 ENCOUNTER — Ambulatory Visit (HOSPITAL_COMMUNITY): Admission: RE | Admit: 2010-03-11 | Discharge: 2010-03-11 | Payer: Self-pay | Admitting: Orthopedic Surgery

## 2010-03-18 ENCOUNTER — Ambulatory Visit: Payer: Self-pay | Admitting: Gastroenterology

## 2010-03-24 ENCOUNTER — Telehealth: Payer: Self-pay | Admitting: Gastroenterology

## 2010-03-26 ENCOUNTER — Telehealth: Payer: Self-pay | Admitting: Family Medicine

## 2010-03-30 ENCOUNTER — Telehealth: Payer: Self-pay | Admitting: Family Medicine

## 2010-04-05 ENCOUNTER — Telehealth: Payer: Self-pay | Admitting: Gastroenterology

## 2010-04-13 ENCOUNTER — Ambulatory Visit: Payer: Self-pay | Admitting: Thoracic Surgery

## 2010-04-13 ENCOUNTER — Telehealth: Payer: Self-pay | Admitting: Gastroenterology

## 2010-04-19 ENCOUNTER — Telehealth: Payer: Self-pay | Admitting: Internal Medicine

## 2010-04-19 IMAGING — CT CT ABD-PELV W/ CM
1 of 2 series · 15 of 32 positions shown, 19 images · non-contrast
Comparison: none

REASON FOR EXAM: (1) abd pain; (2) lower abd pain
COMMENTS:

PROCEDURE:     CT  - CT ABDOMEN / PELVIS  W  - August 29, 2009  [DATE]
RESULT:
TECHNIQUE: Helical 3 mm sections were obtained from the lung bases through
the pubic symphysis status post intravenous administration 100 mL of
Usovue-JEP and oral contrast.

[Series 2: 3mm soft tissue · axial · 0.79mm/px · z∈[-488,-47]mm · 15 of 163 slices shown, 19 images]
[im 8/163  soft-tissue]
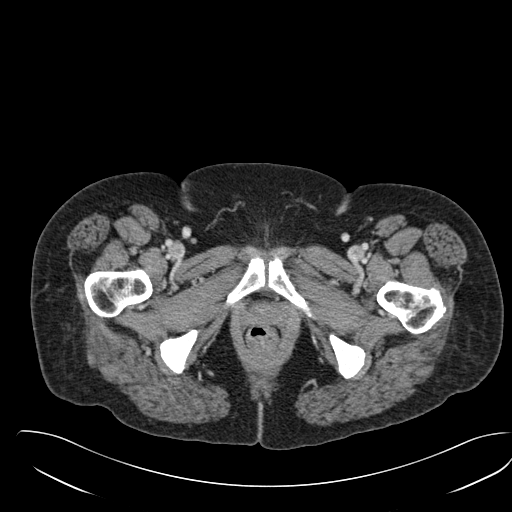
[im 8/163  bone]
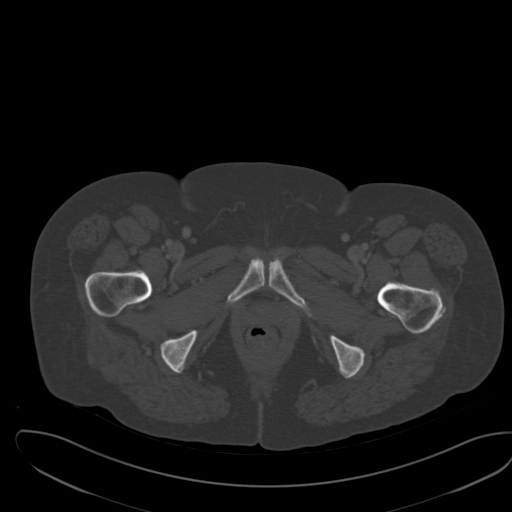
[im 23/163  soft-tissue]
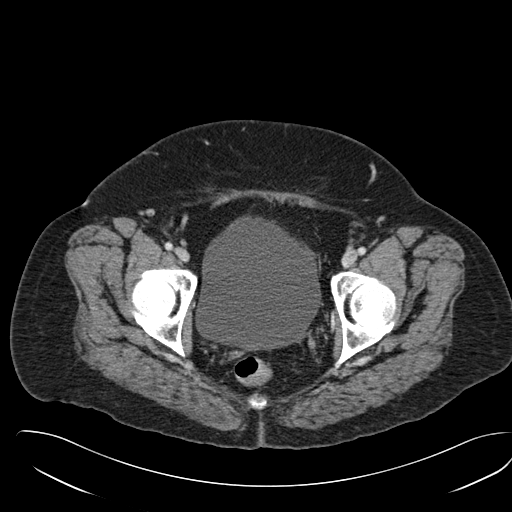
[im 37/163  soft-tissue]
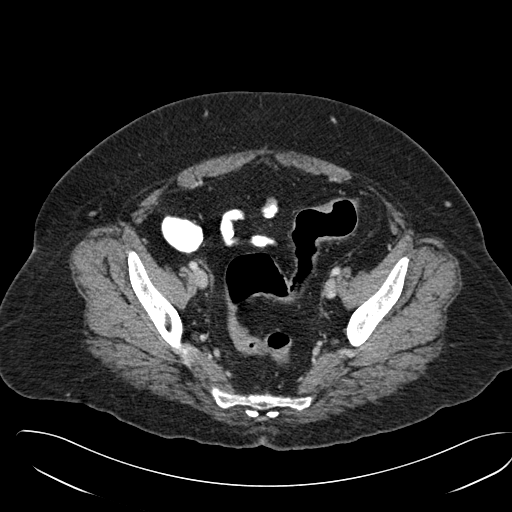
[im 45/163  soft-tissue]
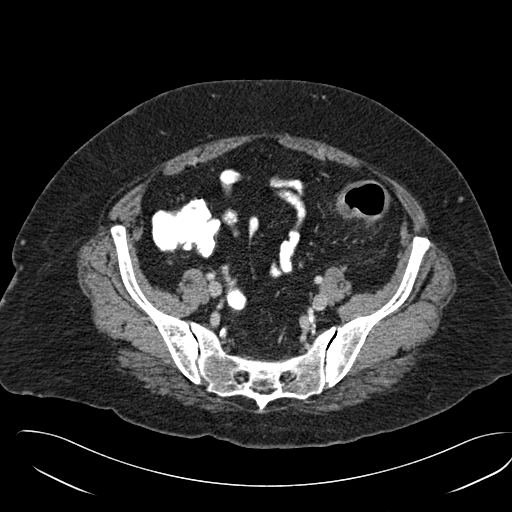
[im 59/163  soft-tissue]
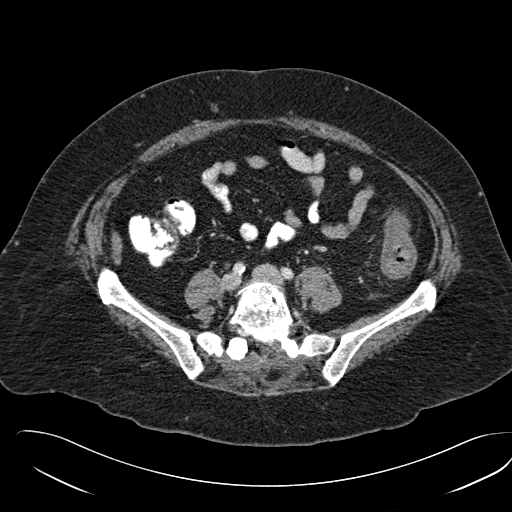
[im 67/163  soft-tissue]
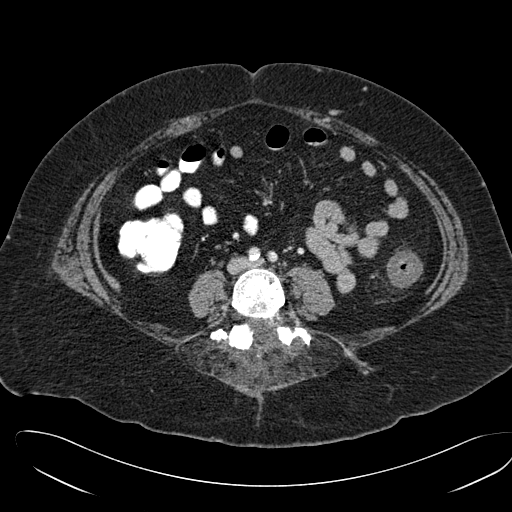
[im 82/163  soft-tissue]
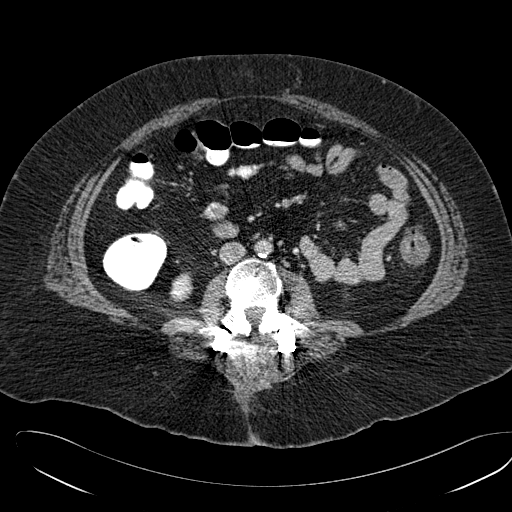
[im 96/163  soft-tissue]
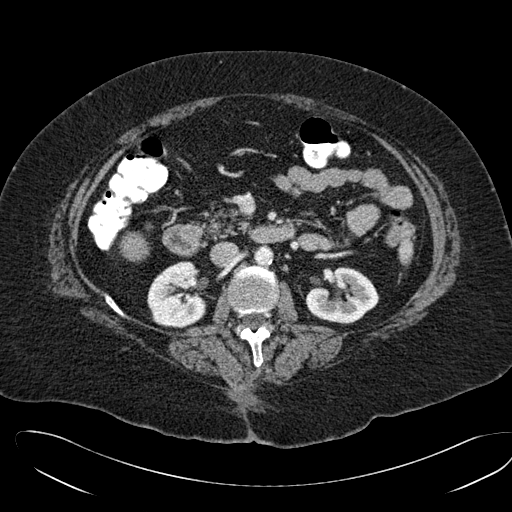
[im 104/163  soft-tissue]
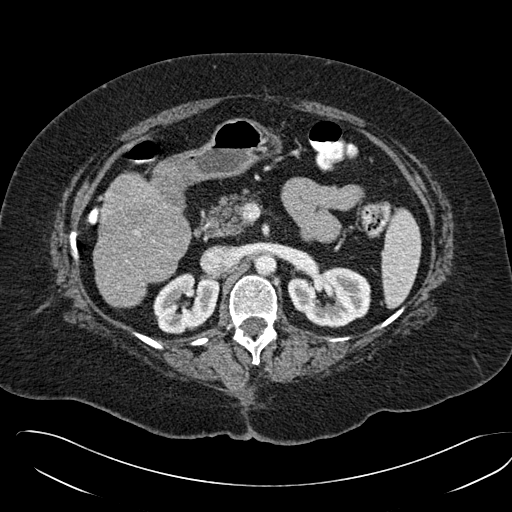
[im 104/163  bone]
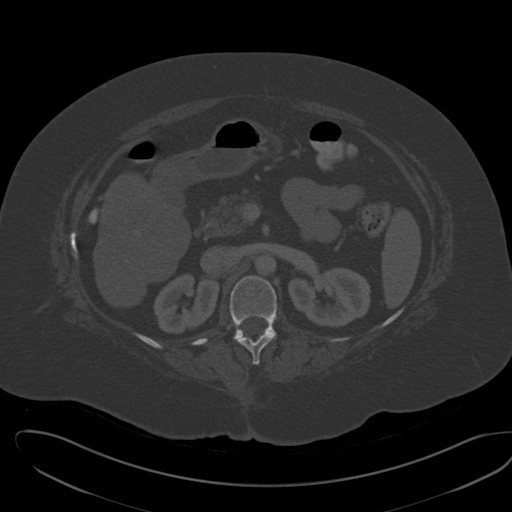
[im 118/163  soft-tissue]
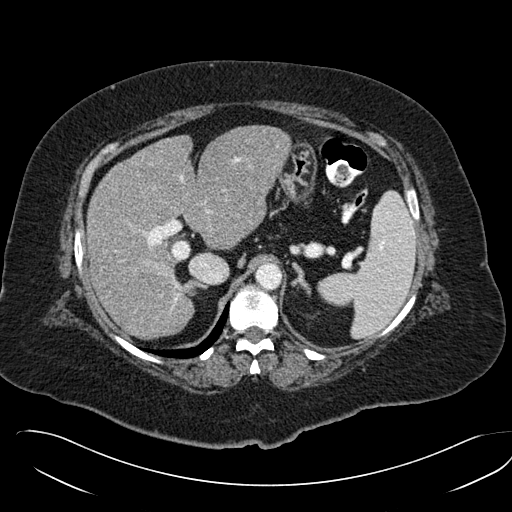
[im 126/163  soft-tissue]
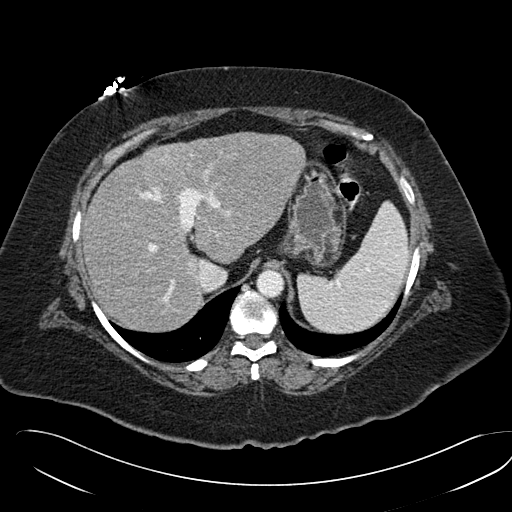
[im 133/163  lung]
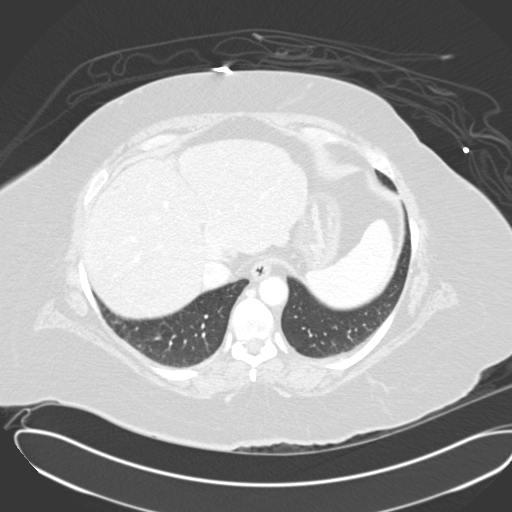
[im 140/163  soft-tissue]
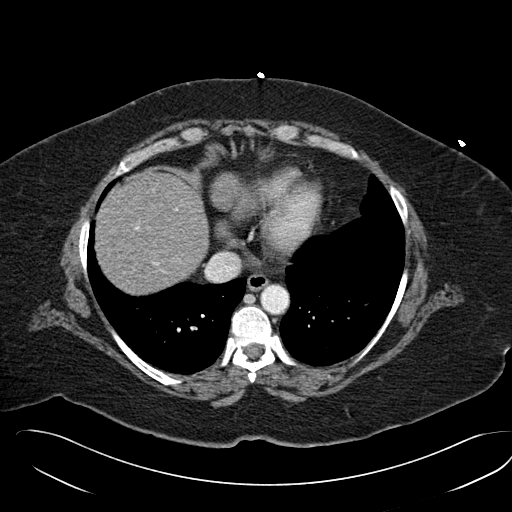
[im 140/163  lung]
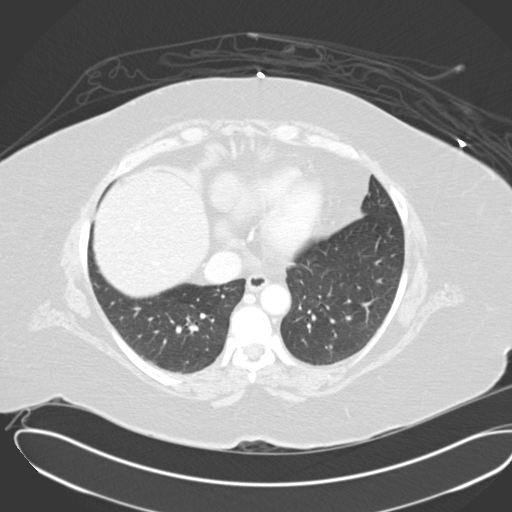
[im 148/163  lung]
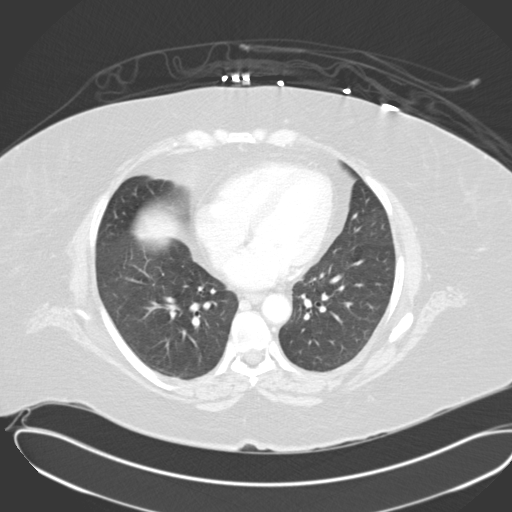
[im 155/163  soft-tissue]
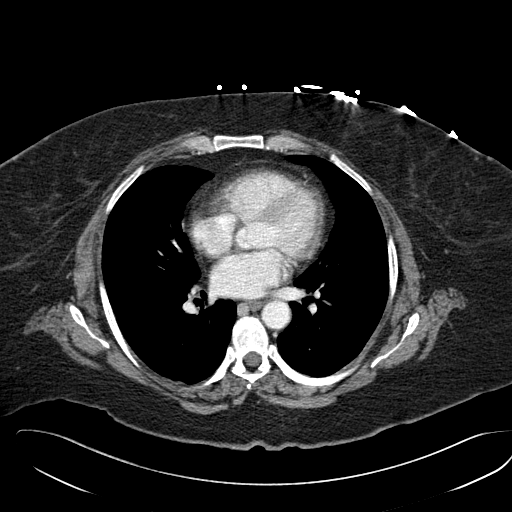
[im 155/163  lung]
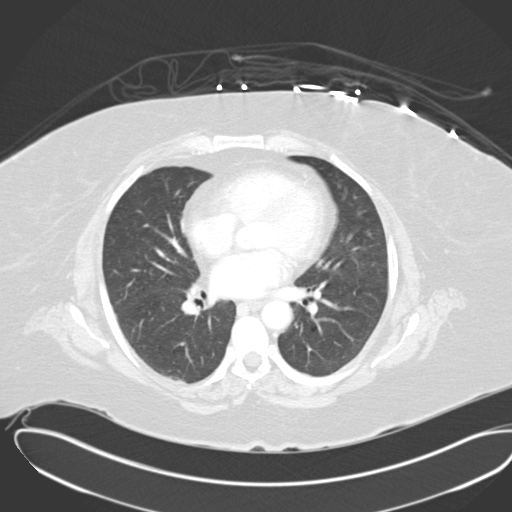

[15 of 32 positions shown; findings below may reference images not displayed]

FINDINGS: Evaluation of the lung bases demonstrates no gross abnormalities.

The liver demonstrates a low attenuating architecture. The spleen, adrenals,
pancreas and kidneys are unremarkable. Evaluation of the descending colon
demonstrates diffuse bowel wall thickening and induration within the
surrounding mesenteric fat. A trace amount of free fluid is identified.
There are no associated drainable loculated fluid collections. There is no
evidence of free air. There is no evidence of bowel obstruction. There is no
evidence of an abdominal aortic aneurysm or dissection. No evidence of
abdominal or pelvic masses, free fluid or loculated fluid collections are
appreciated. The urinary bladder is distended which may represent the
patient's need to void versus possibly a component of bladder outlet
obstruction. The celiac, SMA, portal vein, SMV and IMA are opacified.
IMPRESSION: Colitis involving the descending colon. Differential considerations are
infectious versus inflammatory. Vascular compromise is of lower differential
considerations. There does not appear to be evidence of diverticulosis to
suggest the sequela of diverticulitis though a single non-identified
diverticulum which became inflamed cannot be completely excluded.

## 2010-04-25 IMAGING — CR DG ABDOMEN 3V
1 series · 5 of 5 positions shown · non-contrast
Comparison: none

REASON FOR EXAM: abd pain--please call result to Dr. Alcera
COMMENTS:

[Series 1: view not recorded · 0.17mm/px · 5 of 5 slices shown]
[im 1/5]
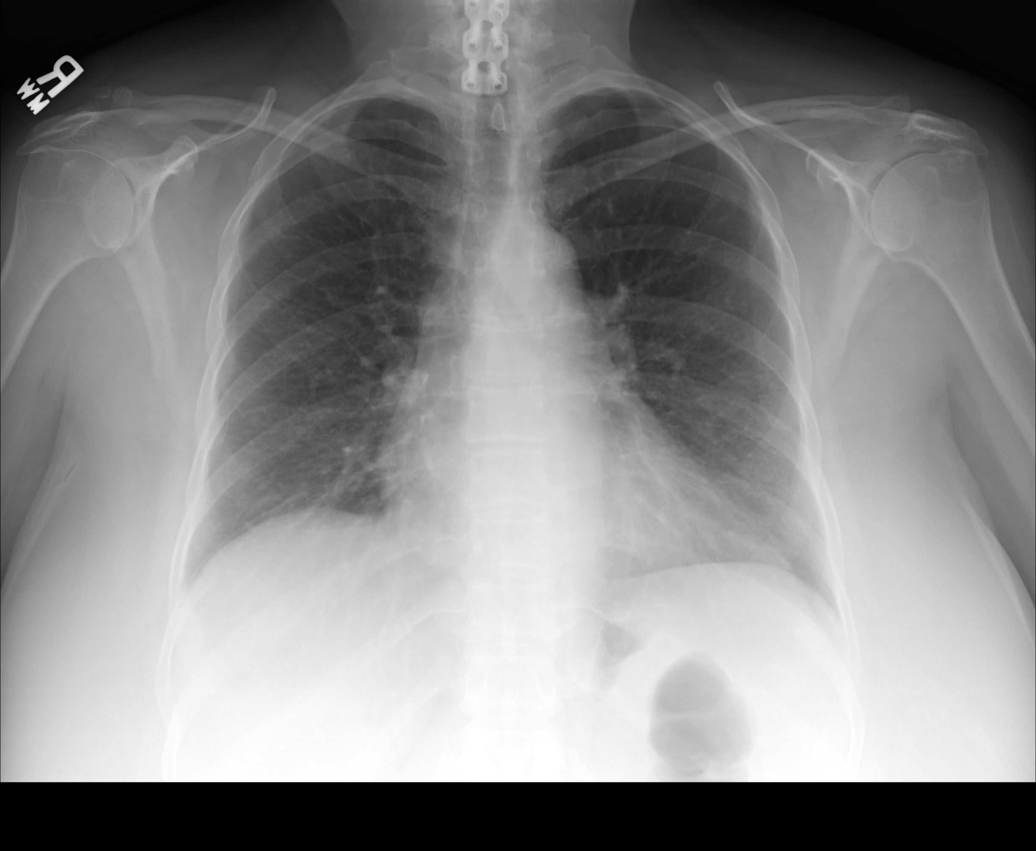
[im 2/5]
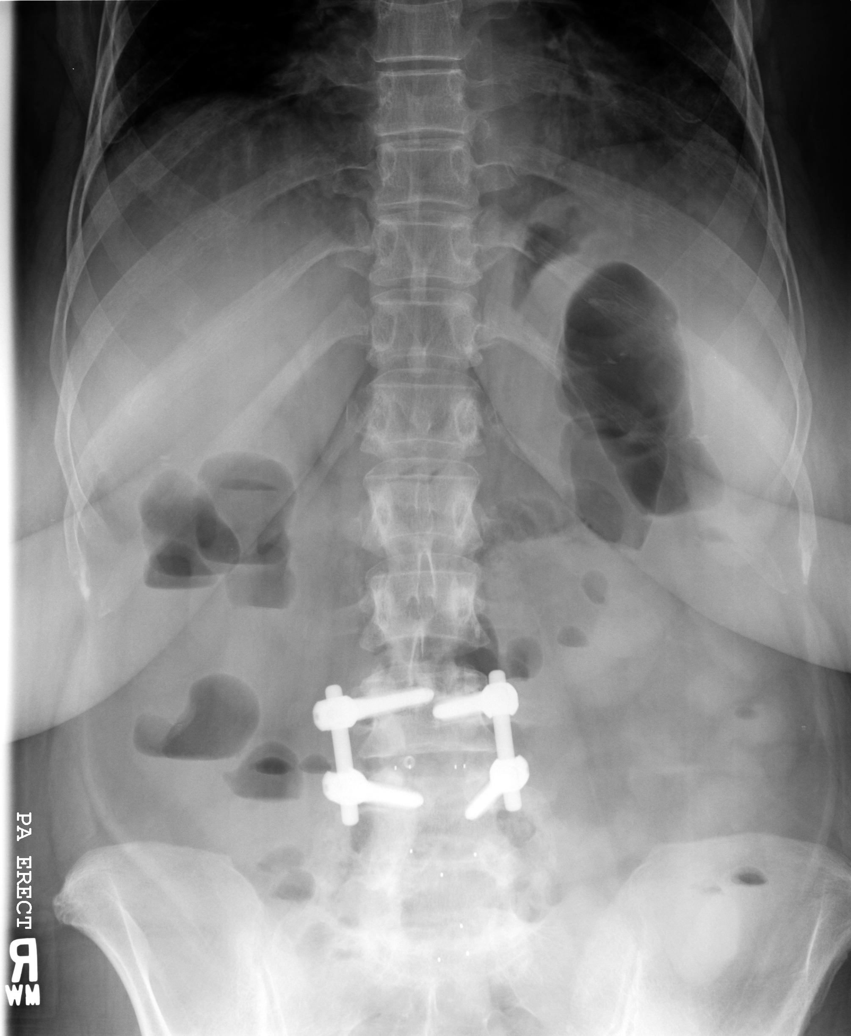
[im 3/5]
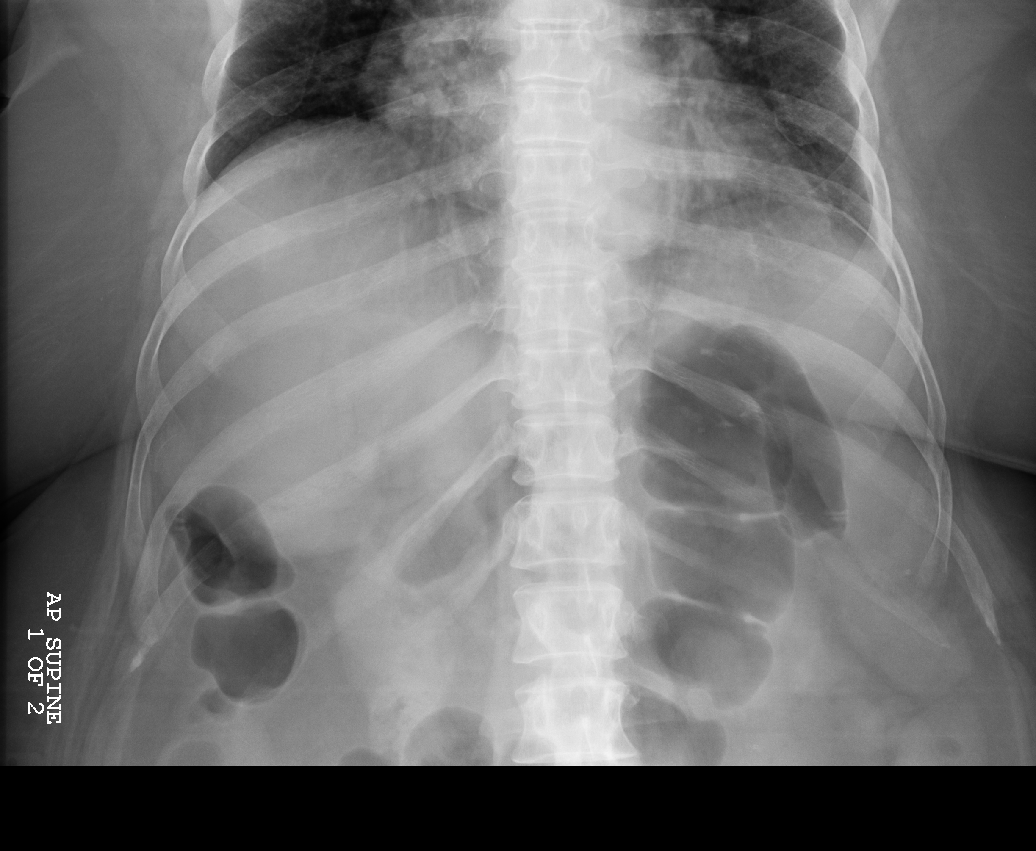
[im 4/5]
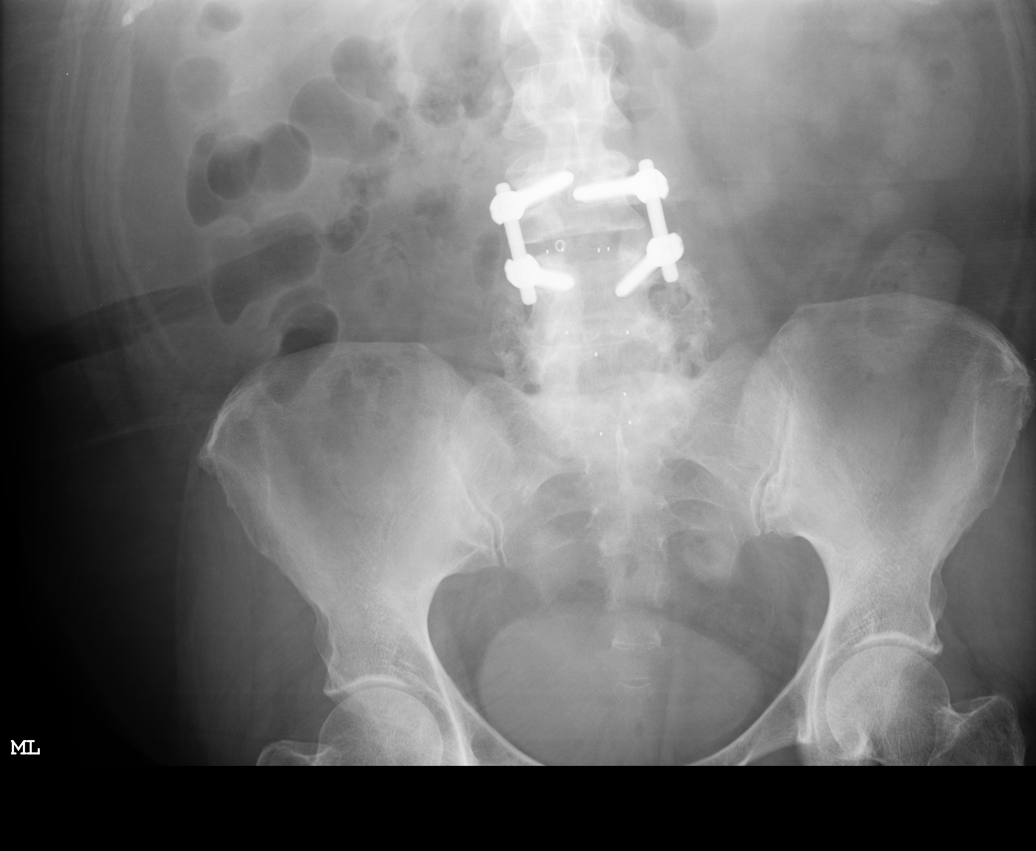
[im 5/5]
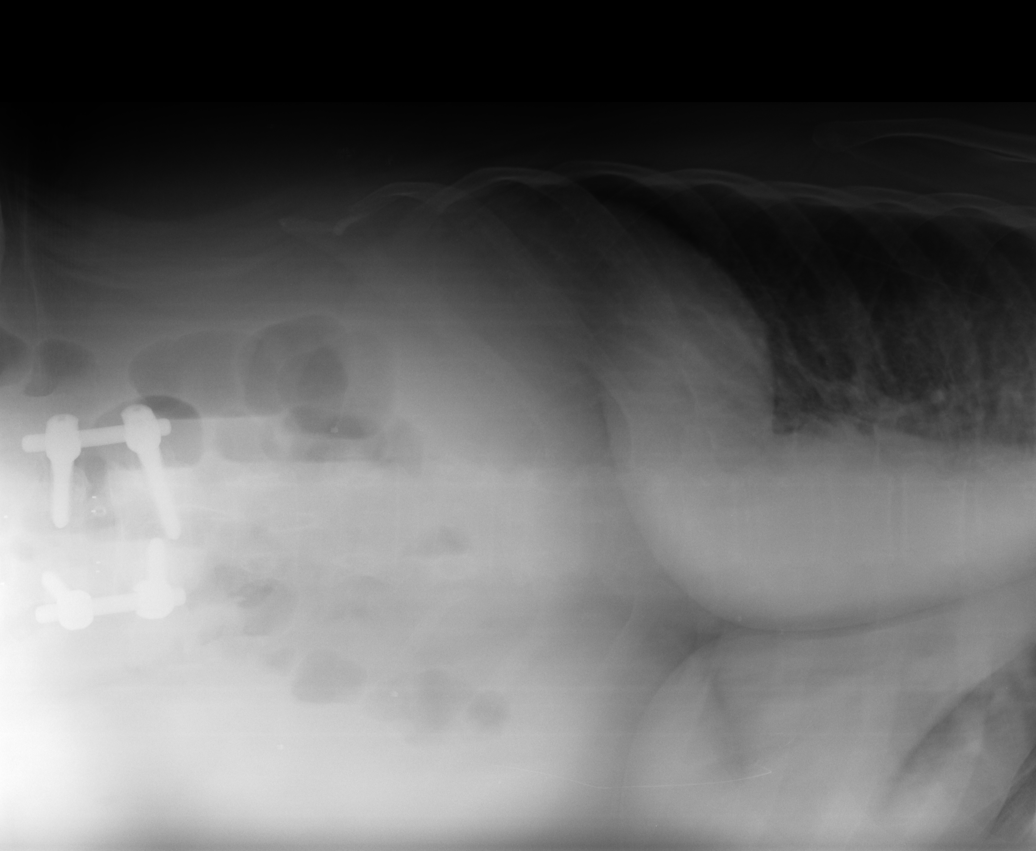

[5 of 5 positions shown; findings below may reference images not displayed]

PROCEDURE:     DXR - DXR ABDOMEN 3-WAY (INCL PA CXR)  - September 04, 2009 [DATE]

RESULT:     The lung fields are clear.

Flat and erect views of the abdomen were obtained. Gas is visualized in both
large and small bowel. No dilated loops of bowel suspicious for bowel
obstruction are seen. There are few, small scattered fluid levels in the
colon and in small bowel. Such fluid levels are nonspecific and are of the
type frequently seen with gastroenteritis, enteritis or intra-abdominal
inflammation. No definitely abnormal intra-abdominal calcifications are
noted. Post operative changes of the lower lumbar spine are noted. No acute
bony abnormalities are identified.
IMPRESSION: 1.  There are scattered nonspecific fluid levels in colon and small bowel.
No findings indicative of bowel obstruction are observed at this time.
2.  Post operative changes of the lower lumbar spine are observed.
3.  PA view of the chest shows the lung fields to be clear.

## 2010-05-10 ENCOUNTER — Encounter: Payer: Self-pay | Admitting: Orthopedic Surgery

## 2010-05-25 ENCOUNTER — Ambulatory Visit: Payer: Self-pay | Admitting: Family Medicine

## 2010-05-25 ENCOUNTER — Encounter: Payer: Self-pay | Admitting: Family Medicine

## 2010-05-25 DIAGNOSIS — R300 Dysuria: Secondary | ICD-10-CM

## 2010-05-25 LAB — CONVERTED CEMR LAB
Ketones, urine, test strip: NEGATIVE
Nitrite: POSITIVE
Specific Gravity, Urine: >=1.03
Urobilinogen, UA: 0.2

## 2010-06-06 ENCOUNTER — Encounter: Payer: Self-pay | Admitting: Orthopedic Surgery

## 2010-06-08 ENCOUNTER — Telehealth: Payer: Self-pay | Admitting: Family Medicine

## 2010-06-27 ENCOUNTER — Encounter: Payer: Self-pay | Admitting: Orthopaedic Surgery

## 2010-06-29 ENCOUNTER — Ambulatory Visit
Admission: RE | Admit: 2010-06-29 | Discharge: 2010-06-29 | Payer: Self-pay | Source: Home / Self Care | Attending: Family Medicine | Admitting: Family Medicine

## 2010-06-29 DIAGNOSIS — L538 Other specified erythematous conditions: Secondary | ICD-10-CM | POA: Insufficient documentation

## 2010-06-29 DIAGNOSIS — R079 Chest pain, unspecified: Secondary | ICD-10-CM | POA: Insufficient documentation

## 2010-07-06 NOTE — Assessment & Plan Note (Signed)
Summary: rov/ mbw   Primary Provider/Referring Provider:  Mena Pauls FNP  CC:  OSA...RLS.Marland KitchenMarland KitchenThe patient says she is using her CPAP 3 nights a week for about 6 hours per night. Her RLS symptoms are controlled on Requip 60m nightly..Marland Kitchen History of Present Illness: 60year old female with known history of COPD- current smoker,  OSA on CPAP 11  She has been doing well with her CPAP.  She has been getting water build up in her mask.  She uses a full face mask.  She ran out of requip, and noticed her legs getting worse.  She is not having any trouble with her breathing.  She still smokes 1/2 pack per day.   Current Medications (verified): 1)  Vivelle-Dot 0.1 Mg/24hr Pttw (Estradiol) .... Twice A Week 2)  Celebrex 200 Mg Caps (Celecoxib) .... Take Twice Daily Tab By Mouth 3)  Requip 4 Mg  Tabs (Ropinirole Hcl) .... One By Mouth At Bedtime 4)  Lamictal 100 Mg  Tabs (Lamotrigine) .... Take 3 Tablest By Mouth Once A Day 5)  Seroquel 400 Mg .... Take 2  Tablet Once Daily At Bedtime 6)  Ambien 10 Mg  Tabs (Zolpidem Tartrate) .... Take 1 Tab By Mouth At Bedtime 7)  Zofran Odt 4 Mg Tbdp (Ondansetron) ..Marland Kitchen. 1 Tab Q6h Prn 8)  Abilify 30 Mg Tabs (Aripiprazole) .... One Tablet Daily 9)  Hydrocodone-Acetaminophen 10-325 Mg Tabs (Hydrocodone-Acetaminophen) .... Take 1 Tablet By Mouth Every 4 To 6 Hours As Needed 10)  Ondansetron Hcl 4 Mg Tabs (Ondansetron Hcl) .... Take 1 Tablet By Mouth Every 6 Hours As Needed 11)  Bactrim Ds 800-160 Mg Tabs (Sulfamethoxazole-Trimethoprim) ..Marland Kitchen. 1 Tab By Mouth Two Times A Day X 10 Days  Allergies (verified): No Known Drug Allergies  Past History:  Past Medical History: Reviewed history from 07/26/2007 and no changes required.  COLONIC POLYPS, ADENOMATOUS (ICD-211.3) HEALTH SCREENING (ICD-V70.0) INSOMNIA (ICD-780.52) RESTLESS LEGS SYNDROME (ICD-333.94) POLYP, COLON (ICD-211.3) INCONTINENCE (ICD-788.30) HEPATITIS B, CHRONIC (ICD-070.32) Hx of RHEUMATIC FEVER  (ICD-390) HYPERTENSION (ICD-401.9) HYPERLIPIDEMIA (ICD-272.4) COPD (ICD-496) DISORDER, NERVOUS SYSTEM NOS (ICD-349.9) BIPOLAR AFFECTIVE DISORDER, HX OF (ICD-V11.8) OBSTRUCTIVE SLEEP APNEA (ICD-327.23) CEREBROVASCULAR ACCIDENT, HX OF (ICD-V12.50)    Past Surgical History: Reviewed history from 04/22/2008 and no changes required. Back surgery Cholecystectomy Rotator Cuff Repair Total Abdominal Hysterectomy fusion in lumbar area 10/09 CT abd 04/09/08--fatty liver, no other findings  Vital Signs:  Patient profile:   60year old female Height:      63 inches (160.02 cm) Weight:      189 pounds (85.91 kg) BMI:     33.60 O2 Sat:      96 % on Room air Temp:     98.0 degrees F (36.67 degrees C) oral Pulse rate:   101 / minute BP sitting:   118 / 80  (left arm) Cuff size:   regular  Vitals Entered By: LFrancesca JewettCMA (July 24, 2009 2:32 PM)  O2 Sat at Rest %:  96 O2 Flow:  Room air  Physical Exam  General:  obese.   Nose:  no sinus tenderness Mouth:  no oral lesions Neck:  no JVD.   Lungs:  diminished breath sounds, no wheezing or rales Heart:  regular rhythm, normal rate, and no murmurs.   Extremities:  no clubbing, cyanosis, edema, or deformity noted Cervical Nodes:  no significant adenopathy   Impression & Recommendations:  Problem # 1:  OBSTRUCTIVE SLEEP APNEA (ICD-327.23)  She is doing well on CPAP 11  cm.  Explained how to fix rain out.  Orders: Est. Patient Level III (09381)  Problem # 2:  COPD (WEX-937) Advise her about need to stop smoking.  Will monitor clinically.  Problem # 3:  RESTLESS LEGS SYNDROME (ICD-333.94)  Will refill her requip.  Orders: Est. Patient Level III (16967)  Complete Medication List: 1)  Vivelle-dot 0.1 Mg/24hr Pttw (Estradiol) .... Twice a week 2)  Celebrex 200 Mg Caps (Celecoxib) .... Take twice daily tab by mouth 3)  Requip 4 Mg Tabs (Ropinirole hcl) .... One by mouth at bedtime 4)  Lamictal 100 Mg Tabs (Lamotrigine)  .... Take 3 tablest by mouth once a day 5)  Seroquel 400 Mg  .... Take 2  tablet once daily at bedtime 6)  Ambien 10 Mg Tabs (Zolpidem tartrate) .... Take 1 tab by mouth at bedtime 7)  Zofran Odt 4 Mg Tbdp (Ondansetron) .Marland Kitchen.. 1 tab q6h prn 8)  Abilify 30 Mg Tabs (Aripiprazole) .... One tablet daily 9)  Hydrocodone-acetaminophen 10-325 Mg Tabs (Hydrocodone-acetaminophen) .... Take 1 tablet by mouth every 4 to 6 hours as needed 10)  Ondansetron Hcl 4 Mg Tabs (Ondansetron hcl) .... Take 1 tablet by mouth every 6 hours as needed 11)  Bactrim Ds 800-160 Mg Tabs (Sulfamethoxazole-trimethoprim) .Marland Kitchen.. 1 tab by mouth two times a day x 10 days  Patient Instructions: 1)  Follow up in 6 months Prescriptions: REQUIP 4 MG  TABS (ROPINIROLE HCL) one by mouth at bedtime  #30 x 5   Entered and Authorized by:   Chesley Mires MD   Signed by:   Chesley Mires MD on 07/24/2009   Method used:   [facility]        [facility]  [place] [facility]       [address]       [place], [place]  [address]       Ph: [phone] or [phone]       Fax: [phone]   RxID:   [id number]    Immunization History:  Pneumovax Immunization History:    Pneumovax:  historical (04/01/2009)

## 2010-07-06 NOTE — Consult Note (Signed)
Summary: Dr.Robert [facility] Consultation  Dr.Robert [facility] Consultation   Imported By: Virgia Land 09/14/2009 16:44:38  _____________________________________________________________________  External Attachment:    Type:   Image     Comment:   External Document

## 2010-07-06 NOTE — Progress Notes (Signed)
Summary: requip & cyclobenzaprine  Phone Note Refill Request Message from:  Fax from Pharmacy on March 30, 2010 8:55 AM  Refills Requested: Medication #1:  REQUIP 5 MG TABS one by mouth at bedtime  Medication #2:  CYCLOBENZAPRINE HCL 10 MG  TABS 1 by mouth 2 times daily as needed for back pain Refill request from [facility] delivery. [phone].   Initial call taken by: Lacretia Nicks,  March 30, 2010 8:56 AM  Follow-up for Phone Call        Rx's called to pharmacy, changed the Requip to a 90 days supply per rep at [facility]. Follow-up by: Sherrian Divers CMA Deborra Medina),  March 30, 2010 10:58 AM    Prescriptions: REQUIP 5 MG TABS (ROPINIROLE HCL) one by mouth at bedtime  #90 x 0   Entered by:   Sherrian Divers CMA ([place])   Authorized by:   [name] MD   Signed by:   Sherrian Divers CMA ([place]) on 03/30/2010   Method used:   Telephoned to ...       [facility]  [place] [place] [facility]ail[facility]       [place], [place]  [address]       Ph: [phone] or [phone]       Fax: [phone]   RxID:   [id number] CYCLOBENZAPRINE HCL 10 MG  TABS (CYCLOBENZAPRINE HCL) 1 by mouth 2 times daily as needed for back pain  #60 x 0   Entered and Authorized by:   [name] MD   Signed by:   [name] MD on 03/30/2010   Method used:   Telephoned to ...       [facility]  [place] [place] (retail[facility]       [place], [place]  [address]       Ph: [phone] or [phone]       Fax: [phone]   RxID:   [id number] REQUIP 5 MG TABS (ROPINIROLE HCL) one by mouth at bedtime  #30 x 3   Entered and Authorized by:   [name] MD   Signed by:   [name] MD on 03/30/2010   Method used:   Telephoned to ...       [facility]  [place] (retail)       [address]       [place], [place]  [address]       Ph: [phone] or [phone]       Fax: [phone]   RxID:   [id number]

## 2010-07-06 NOTE — Progress Notes (Signed)
Summary: Rx Cyclobenzaprine  Phone Note Refill Request Call back at [phone] Message from:  [facility] on December 03, 2009 10:40 AM  Refills Requested: Medication #1:  CYCLOBENZAPRINE HCL 10 MG  TABS 1 by mouth 2 times daily as needed for back pain Received E-script request please advise.    Method Requested: Electronic Initial call taken by: Sherrian Divers CMA Deborra Medina),  December 03, 2009 10:41 AM    Prescriptions: CYCLOBENZAPRINE HCL 10 MG  TABS (CYCLOBENZAPRINE HCL) 1 by mouth 2 times daily as needed for back pain  #60 x 0   Entered and Authorized by:   [name] MD   Signed by:   [name] MD on 12/03/2009   Method used:   Electronically to        [facility]  [place] (retail)       [address]       [place], [place]  [address]       Ph: [phone] or [phone]       Fax: [phone]   RxID:   [id number]

## 2010-07-06 NOTE — Progress Notes (Signed)
Summary: nausea   Phone Note Call from Patient Call back at Home Phone [phone]   Call For: Dr Deatra Ina Summary of Call: Being off the estrogen patch makes her very nervous. Still has nausea Initial call taken by: Irwin Brakeman [facility],  March 24, 2010 12:50 PM  Follow-up for Phone Call        Patient  notes she is off estrogen from 1 week.  She sees no change in her nausea.  She wants to go back on it .  She is very nervous being off of it , and very "moody".  Dr Deatra Ina please advise Follow-up by: Barb Merino RN, [place],  March 24, 2010 1:34 PM  Additional Follow-up for Phone Call Additional follow up Details #1::        She needs to be off it at least 10 days Additional Follow-up by: Inda Castle MD,  March 24, 2010 1:53 PM    Additional Follow-up for Phone Call Additional follow up Details #2::    Patient  advised of Dr Kelby Fam response.  She will call back next week if she still has problems Follow-up by: Barb Merino RN, CGRN,  March 24, 2010 2:01 PM

## 2010-07-06 NOTE — Progress Notes (Signed)
Summary: wants something for abd pain  Phone Note Call from Patient Call back at Home Phone [phone] Call back at Work Phone [phone]   Caller: Patient Summary of Call: Pt is having abd pain, she was recently hospitalized for gastritis and hiatal hernia.  She is taking 40 mg's of prilosec and ondansetron, also colace as needed.   She is asking for something for pain, she says she has muscle spasms in her stomach.  Not sure what she means, but she said vicodin would be good to try. Her GI told her she would have to get something for pain from her PCP.   [facility]. Initial call taken by: Marty Heck CMA,  Oct 06, 2009 4:11 PM  Follow-up for Phone Call        I cannot prescribe narcotics for something like this without seeing her.  I am sorry she is pain but it would be very bad medicine to prescribe pain medications like that without seeing her. [name] MD  Oct 07, 2009 7:11 AM  Patient Advised.   Patient will schedule appointment.  Lugene Fuquay CMA Deborra Medina)  Oct 07, 2009 12:38 PM

## 2010-07-06 NOTE — Progress Notes (Signed)
Summary: regarding requip  Phone Note Call from Patient   Caller: Patient Call For: [name] MD Summary of Call: Pt called to ask if you will start refilling her requip.  She has been getting this from another doctor.  She doesnt need any right now, she has refills. Initial call taken by: Marty Heck CMA,  February 02, 2010 10:24 AM  Follow-up for Phone Call        that's fine. [name] MD  February 02, 2010 10:39 AM

## 2010-07-06 NOTE — Progress Notes (Signed)
Summary: Meds refill  Medications Added ONDANSETRON HCL 4 MG TABS (ONDANSETRON HCL) Take 1 tablet by mouth every 6 hours as needed       Phone Note Call from Patient Call back at Home Phone [phone]   Caller: Patient Call For: Dr Deatra Ina Reason for Call: Refill Medication Summary of Call: Pt was told she needed appt before her Ondansetron 34m could be refilled.  She has sch appt for 03/18/10.  Would like meds refilled to get her through to appt.  [facility] [place]   Initial call taken by: JGara Kroner  February 01, 2010 2:53 PM  Follow-up for Phone Call        Called pt to inform Follow-up by: RGenella MechCMA (Deborra Medina,  February 01, 2010 3:22 PM    New/Updated Medications: ONDANSETRON HCL 4 MG TABS (ONDANSETRON HCL) Take 1 tablet by mouth every 6 hours as needed Prescriptions: ONDANSETRON HCL 4 MG TABS (ONDANSETRON HCL) Take 1 tablet by mouth every 6 hours as needed  #30 x 1   Entered by:   RGenella MechCMA ([place]   Authorized by:   RInda CastleMD   Signed by:   RGenella MechCMA ([place] on 02/01/2010   Method used:   Electronically [facility]        [facility]  [place] (retail)       [address]      [place]  [address]      Ph: [phone] [phone]      Fax: [phone]  RxID:   [id number]

## 2010-07-06 NOTE — Progress Notes (Signed)
Summary: cyclobenzaprine   Phone Note Refill Request Message from:  Fax from Pharmacy on March 26, 2010 8:51 AM  Refills Requested: Medication #1:  CYCLOBENZAPRINE HCL 10 MG  TABS 1 by mouth 2 times daily as needed for back pain  Medication #2:  REQUIP 5 MG TABS one by mouth at bedtime [facility]ill [facility] from [facility]. [phone]  Initial call taken by: Lacretia Nicks,  March 26, 2010 8:56 AM    Prescriptions: CYCLOBENZAPRINE HCL 10 MG  TABS (CYCLOBENZAPRINE HCL) 1 by mouth 2 times daily as needed for back pain  #60 x 0   Entered and Authorized by:   [name] MD   Signed by:   [name] MD on 03/26/2010   Method used:   Electronically to        [facility]  [place] [place] (retail)       [place], [place]  [address]       Ph: [phone] or [phone]       Fax: [phone]   RxID:   [id number] REQUIP 5 MG TABS (ROPINIROLE HCL) one by mouth at bedtime  #30 x 3   Entered and Authorized by:   [name] MD   Signed by:   [name] MD on 03/26/2010   Method used:   Electronically to        [facility]  [place] (retail)       [address]       [place], [place]  [address]       Ph: [phone] or [phone]       Fax: [phone]   RxID:   [id number]

## 2010-07-06 NOTE — Assessment & Plan Note (Signed)
Summary: HOSPITAL FOLLOW UP STOMACH PAIN/RBH   Vital Signs:  Patient profile:   60 year old female Height:      63 inches Weight:      187.38 pounds BMI:     33.31 Temp:     98.5 degrees F oral Pulse rate:   80 / minute Pulse rhythm:   regular BP sitting:   96 / 68  (left arm) Cuff size:   regular  Vitals Entered By: [place] Deborra Medina) (Oct 08, 2009 2:11 PM) CC: Hospital follow up - stomach pain   History of Present Illness: 60 Stephanie [name] here for hospital follow up.  We do not have discharge summary available but have reviewed H and P, EGD ,and colonoscopy.  Admitted to [facility] on 09/04/09 for epigastric pain. Per EGD report has hiatial hernia and gastritis.  Colonoscopy showed 5 mm polyp, awaiting path results as well.  Pt here today because she wants something for pain. Given percocet at discharge due to pain she was having s/p EGD and colonoscopy. That pain has improved but she still has epigastric burning.  Was also sent home on Omeprazole 40 mg daily and Colace as needed. Still constipated but also improved. No n/v/d.  No fevers.  Current Medications (verified): 1)  Vivelle-Dot 0.1 Mg/24hr Pttw (Estradiol) .... Twice A Week 2)  Celebrex 200 Mg Caps (Celecoxib) .... Take Twice Daily Tab By Mouth 3)  Requip 4 Mg  Tabs (Ropinirole Hcl) .... One By Mouth At Bedtime 4)  Lamictal 100 Mg  Tabs (Lamotrigine) .... Take 3 Tablest By Mouth Once A Day 5)  Seroquel 400 Mg .... Take 2  Tablet Once Daily At Bedtime 6)  Ambien 10 Mg  Tabs (Zolpidem Tartrate) .... Take 1 Tab By Mouth At Bedtime 7)  Abilify 30 Mg Tabs (Aripiprazole) .... One Tablet Daily 8)  Ondansetron Hcl 4 Mg Tabs (Ondansetron Hcl) .... Take 1 Tablet By Mouth Every 6 Hours As Needed 9)  Bactrim Ds 800-160 Mg Tabs (Sulfamethoxazole-Trimethoprim) .Marland Kitchen.. 1 Tab By Mouth Two Times A Day X 10 Days 10)  Omeprazole 40 Mg Cpdr (Omeprazole) .... Take 1 Tablet By Mouth Once A Day 11)  Docusate Sodium 100 Mg Caps (Docusate Sodium)  .... Take 1 Capsule By Mouth Two Times A Day As Needed  Allergies (verified): No Known Drug Allergies  Review of Systems      See HPI General:  Denies chills and fever. GI:  Complains of abdominal pain and constipation; denies bloody stools, nausea, and vomiting.  Physical Exam  General:  alert, well-developed, well-nourished, and well-hydrated.   VS reviewed- afebrile and normotensive. Head:  normocephalic and atraumatic.   Mouth:  MMM Abdomen:  soft, non-tender, normal bowel sounds, no distention, no masses, and no guarding.   Extremities:  no edema in either leg Neurologic:  alert & oriented X3.   Psych:  normally interactive, not anxious appearing, flat affect, and subdued.     Impression & Recommendations:  Problem # 1:  ABDOMINAL PAIN (ICD-789.00) Assessment Unchanged  Time spent with patient 25 minutes, more than 50% of this time was spent discussing her hospital admission and findings.  I explained that narcotics are not a good treatment for gastritis and hiatal hernia and I would not prescribe this for her.  I did agree to review discharge summary once I get it and if there are additional findings that would require further treatment, we will contact her.  I advised continue taking Omeprazole and Colace (she  had run out), refills provided.  Orders: Prescription Created Electronically [phone])  Complete Medication List: 1)  Vivelle-dot 0.1 Mg/24hr Pttw (Estradiol) .... Twice a week 2)  Celebrex 200 Mg Caps (Celecoxib) .... Take twice daily tab by mouth 3)  Requip 4 Mg Tabs (Ropinirole hcl) .... One by mouth at bedtime 4)  Lamictal 100 Mg Tabs (Lamotrigine) .... Take 3 tablest by mouth once a day 5)  Seroquel 400 Mg  .... Take 2  tablet once daily at bedtime 6)  Ambien 10 Mg Tabs (Zolpidem tartrate) .... Take 1 tab by mouth at bedtime 7)  Abilify 30 Mg Tabs (Aripiprazole) .... One tablet daily 8)  Ondansetron Hcl 4 Mg Tabs (Ondansetron hcl) .... Take 1 tablet by mouth  every 6 hours as needed 9)  Bactrim Ds 800-160 Mg Tabs (Sulfamethoxazole-trimethoprim) .Marland Kitchen.. 1 tab by mouth two times a day x 10 days 10)  Omeprazole 40 Mg Cpdr (Omeprazole) .... Take 1 tablet by mouth once a day 11)  Docusate Sodium 100 Mg Caps (Docusate sodium) .... Take 1 capsule by mouth two times a day as needed Prescriptions: OMEPRAZOLE 40 MG CPDR (OMEPRAZOLE) Take 1 tablet by mouth once a day  #60 x 6   Entered and Authorized by:   [name] MD   Signed by:   [name] MD on 10/08/2009   Method used:   Electronically to        [facility]  [place] [place] (retail)       [place], [place]  [address]       Ph: [phone] or [phone]       Fax: [phone]   RxID:   [id number] DOCUSATE SODIUM 100 MG CAPS (DOCUSATE SODIUM) Take 1 capsule by mouth two times a day as needed  #60 x 6   Entered and Authorized by:   [name] MD   Signed by:   [name] MD on 10/08/2009   Method used:   Electronically to        [facility]  [place] [place] ([facility]ail)       [address]       [place], [place]  [address]       Ph: [phone] or [phone]       Fax: [phone]   RxID:   [id number]    Orders Added: 1)  Est. Patient Level IV [11031] 2)  Prescription Created Electronically [id number]   Current Allergies (reviewed today): No known allergies

## 2010-07-06 NOTE — Assessment & Plan Note (Signed)
Summary: 30 min appt boil inside of leg billies patient/rbh   Vital Signs:  Patient profile:   60 year old female Height:      63 inches Weight:      185.50 pounds BMI:     32.98 Temp:     98.3 degrees F oral Pulse rate:   88 / minute Pulse rhythm:   regular BP sitting:   102 / 68  (left arm) Cuff size:   regular  Vitals Entered By: Christena Deem CMA Deborra Medina) (July 13, 2009 12:21 PM) CC: 30 minute appointment.  Boil inside of leg.   History of Present Illness: 60 yo female with boil in left groin area.  Started as a pimple a few days ago, kept growing in size, redness and warmth around it. Very painful. Ruptured this morning, a little bloody but mostly pus. Feels better but still painful. No fevers, chills, nausea, vomiting or diarrhea. Says she has had boils before but never one this large or painful.  Would also like her cholesterol checked today.  Current Medications (verified): 1)  Vivelle-Dot 0.1 Mg/24hr Pttw (Estradiol) .... Twice A Week 2)  Celebrex 200 Mg Caps (Celecoxib) .... Take Twice Daily Tab By Mouth 3)  Requip 4 Mg  Tabs (Ropinirole Hcl) .... One By Mouth At Bedtime 4)  Lamictal 100 Mg  Tabs (Lamotrigine) .... Take 3 Tablest By Mouth Once A Day 5)  Seroquel 400 Mg .... Take 2  Tablet Once Daily At Bedtime 6)  Ambien 10 Mg  Tabs (Zolpidem Tartrate) .... Take 1 Tab By Mouth At Bedtime 7)  Zofran Odt 4 Mg Tbdp (Ondansetron) .Marland Kitchen.. 1 Tab Q6h Prn 8)  Abilify 30 Mg Tabs (Aripiprazole) .... One Tablet Daily 9)  Morphine Sulfate 30 Mg Tabs (Morphine Sulfate) .Marland Kitchen.. 1 By Mouth Two Times A Day 10)  Hydrocodone-Acetaminophen 10-325 Mg Tabs (Hydrocodone-Acetaminophen) .... Take 1 Tablet By Mouth Every 4 To 6 Hours As Needed 11)  Ondansetron Hcl 4 Mg Tabs (Ondansetron Hcl) .... Take 1 Tablet By Mouth Every 6 Hours As Needed 12)  Bactrim Ds 800-160 Mg Tabs (Sulfamethoxazole-Trimethoprim) .Marland Kitchen.. 1 Tab By Mouth Two Times A Day X 10 Days 13)  Hydrocodone-Acetaminophen 5-325 Mg  Tabs (Hydrocodone-Acetaminophen) .Marland Kitchen.. 1 By Mouth Up To 4 Times Per Day As Needed For Pain  Allergies (verified): No Known Drug Allergies  Review of Systems      See HPI General:  Denies chills and fever. GI:  Denies diarrhea, nausea, and vomiting.  Physical Exam  General:  alert, well-developed, well-nourished, and well-hydrated.   VS reviewed- afebrile and normotensive. Mouth:  MMM Skin:  3 cm open abscess in left inguinal region, actively draining yellowish/bloody pus, surrounding warmth and erythema, not spreading. Inguinal Nodes:  No significant adenopathy Psych:  normally interactive, not anxious appearing, flat affect, and subdued.     Impression & Recommendations:  Problem # 1:  CELLULITIS AND ABSCESS OF OTHER SPECIFIED SITE [id number]) Assessment New Given that it is open and draining, no need for I & D.  I will cover with 10 day course of Bactrim. Continue supportive care. See patient instructions for details. Her updated medication list for this problem includes:    Bactrim Ds 800-160 Mg Tabs (Sulfamethoxazole-trimethoprim) .Marland Kitchen... 1 tab by mouth two times a day x 10 days  Complete Medication List: 1)  Vivelle-dot 0.1 Mg/24hr Pttw (Estradiol) .... Twice a week 2)  Celebrex 200 Mg Caps (Celecoxib) .... Take twice daily tab by mouth 3)  Requip 4  Mg Tabs (Ropinirole hcl) .... One by mouth at bedtime 4)  Lamictal 100 Mg Tabs (Lamotrigine) .... Take 3 tablest by mouth once a day 5)  Seroquel 400 Mg  .... Take 2  tablet once daily at bedtime 6)  Ambien 10 Mg Tabs (Zolpidem tartrate) .... Take 1 tab by mouth at bedtime 7)  Zofran Odt 4 Mg Tbdp (Ondansetron) .Marland Kitchen.. 1 tab q6h prn 8)  Abilify 30 Mg Tabs (Aripiprazole) .... One tablet daily 9)  Morphine Sulfate 30 Mg Tabs (Morphine sulfate) .Marland Kitchen.. 1 by mouth two times a day 10)  Hydrocodone-acetaminophen 10-325 Mg Tabs (Hydrocodone-acetaminophen) .... Take 1 tablet by mouth every 4 to 6 hours as needed 11)  Ondansetron Hcl 4 Mg Tabs  (Ondansetron hcl) .... Take 1 tablet by mouth every 6 hours as needed 12)  Bactrim Ds 800-160 Mg Tabs (Sulfamethoxazole-trimethoprim) .Marland Kitchen.. 1 tab by mouth two times a day x 10 days 13)  Hydrocodone-acetaminophen 5-325 Mg Tabs (Hydrocodone-acetaminophen) .Marland Kitchen.. 1 by mouth up to 4 times per day as needed for pain  Other Orders: Venipuncture (17494) TLB-Lipid Panel (80061-LIPID) TLB-Hepatic/Liver Function Pnl (80076-HEPATIC)  Patient Instructions: 1)  Please take antibiotics as directed. 2)  Keep applying warm compresses to area. 3)  Use pain medication as needed. 4)  Please call us if you develop fevers, chills, nausea or vomiting. Prescriptions: HYDROCODONE-ACETAMINOPHEN 5-325 MG TABS (HYDROCODONE-ACETAMINOPHEN) 1 by mouth up to 4 times per day as needed for pain  #30 x 0   Entered and Authorized by:   [name] MD   Signed by:   [name] MD on 07/13/2009   Method used:   Print then Give to Patient   RxID:   [id number] BACTRIM DS 800-160 MG TABS (SULFAMETHOXAZOLE-TRIMETHOPRIM) 1 tab by mouth two times a day x 10 days  #20 x 0   Entered and Authorized by:   [name] MD   Signed by:   [name] MD on 07/13/2009   Method used:   [facility]        [facility]  [address] [facility]ret[facility]       [place], [place]  [address]       Ph: [phone] or [phone]       Fax: [phone]   RxID:   [id number]   Current Allergies (reviewed today): No known allergies

## 2010-07-06 NOTE — Assessment & Plan Note (Signed)
Summary: 6 months/apc   Visit Type:  Follow-up Primary Deaglan Lile/Referring Jennika Ringgold:  [name] MD  CC:  OSA. The patient says her RLS symptoms have been worse for 4 months. No cpap use.Marland Kitchen  History of Present Illness: 60 year old female with known history of COPD- current smoker,  RLS, and OSA intolerant of CPAP.  She has not used CPAP for the past 8 months.  She has tried multiple masks, but can't get used to the pressure.  She still coughs a lot at night.  She goes to bed at 9pm.  She takes [place] and seroquel at 830pm.  She is up several times per night, but can usually fall back to sleep.  She gets out of bed at 10am, and still feels tired.    She is not using any inhalers for her breathing.  She is not very active.  She continues to smoke 1/2 pack per day.  She does not feel like she has any problem with her breathing.  Current Medications (verified): 1)  Vivelle-Dot 0.1 Mg/24hr Pttw (Estradiol) .... Twice A Week 2)  Celebrex 200 Mg Caps (Celecoxib) .... Take Twice Daily Tab By Mouth 3)  Requip 4 Mg  Tabs (Ropinirole Hcl) .... One By Mouth At Bedtime 4)  Lamictal 100 Mg  Tabs (Lamotrigine) .... Take 3 Tablest By Mouth Once A Day 5)  Seroquel 300 Mg Tabs (Quetiapine Fumarate) .... Take One Tablet By Mouth Three Times Daily 6)  Ambien 10 Mg  Tabs (Zolpidem Tartrate) .... Take 1 Tab By Mouth At Bedtime 7)  Abilify 30 Mg Tabs (Aripiprazole) .... One Tablet Daily 8)  Ondansetron Hcl 4 Mg Tabs (Ondansetron Hcl) .... Take 1 Tablet By Mouth Every 6 Hours As Needed 9)  Omeprazole 40 Mg Cpdr (Omeprazole) .... Take 1 Tablet By Mouth Once A Day 10)  Docusate Sodium 100 Mg Caps (Docusate Sodium) .... Take 1 Capsule By Mouth Two Times A Day As Needed 11)  Klonopin 1 Mg Tabs (Clonazepam) .... Take One To Two Tablets By Mouth Daily 12)  Cyclobenzaprine Hcl 10 Mg  Tabs (Cyclobenzaprine Hcl) .Marland Kitchen.. 1 By Mouth 2 Times Daily As Needed For Back Pain 13)  Vicodin 5-500 Mg Tabs (Hydrocodone-Acetaminophen)  .Marland Kitchen.. 1 Tab Every 6 Hours As Needed For Pain  Allergies (verified): No Known Drug Allergies  Past History:  Past Medical History: Reviewed history from 07/26/2007 and no changes required.  COLONIC POLYPS, ADENOMATOUS (ICD-211.3) HEALTH SCREENING (ICD-V70.0) INSOMNIA (ICD-780.52) RESTLESS LEGS SYNDROME (ICD-333.94) POLYP, COLON (ICD-211.3) INCONTINENCE (ICD-788.30) HEPATITIS B, CHRONIC (ICD-070.32) Hx of RHEUMATIC FEVER (ICD-390) HYPERTENSION (ICD-401.9) HYPERLIPIDEMIA (ICD-272.4) COPD (ICD-496) DISORDER, NERVOUS SYSTEM NOS (ICD-349.9) BIPOLAR AFFECTIVE DISORDER, HX OF (ICD-V11.8) OBSTRUCTIVE SLEEP APNEA (ICD-327.23) CEREBROVASCULAR ACCIDENT, HX OF (ICD-V12.50)    Past Surgical History: Reviewed history from 04/22/2008 and no changes required. Back surgery Cholecystectomy Rotator Cuff Repair Total Abdominal Hysterectomy fusion in lumbar area 10/09 CT abd 04/09/08--fatty liver, no other findings  Vital Signs:  Patient profile:   60 year old female Height:      63 inches (160.02 cm) Weight:      198.25 pounds (90.11 kg) BMI:     35.25 O2 Sat:      96 % on Room air Temp:     97.8 degrees F oral Pulse rate:   104 / minute BP sitting:   128 / 76  (left arm) Cuff size:   large  Vitals Entered By: Francesca Jewett CMA (January 25, 2010 1:32 PM)  O2 Sat at Rest %:  96 O2 Flow:  Room air CC: OSA. The patient says her RLS symptoms have been worse for 4 months. No cpap use. Comments Medications reviewed and verified. with the aptient. Francesca Jewett [facility]  January 25, 2010 1:39 PM   Physical Exam  General:  obese.   Nose:  no sinus tenderness Mouth:  no oral lesions Neck:  no JVD.   Lungs:  diminished breath sounds, no wheezing or rales Heart:  regular rhythm, normal rate, and no murmurs.   Extremities:  no clubbing, cyanosis, edema, or deformity noted Cervical Nodes:  no significant adenopathy   Impression & Recommendations:  Problem # 1:  OBSTRUCTIVE SLEEP APNEA  (ICD-327.23)  She has not been able to tolerate CPAP, and is unwilling to try again.  Discussed alternatives to CPAP also.  Advised her to call if she changes her mind about treating her sleep apnea.  Problem # 2:  RESTLESS LEGS SYNDROME (ICD-333.94)  She did better with requip at 5 mg.  Will increase her dose.  Advised her to follow up with her primary physician for further treatment of this.  Problem # 3:  COPD (IEP-329) She has not improved with prior attempts at inhaler therapy.  She continues to smoke, and I again advised her about the need to stop smoking.  She can discuss this further with her primary physician.  Medications Added to Medication List This Visit: 1)  Requip 5 Mg Tabs (Ropinirole hcl) .... One by mouth at bedtime  Complete Medication List: 1)  Vivelle-dot 0.1 Mg/24hr Pttw (Estradiol) .... Twice a week 2)  Celebrex 200 Mg Caps (Celecoxib) .... Take twice daily tab by mouth 3)  Requip 5 Mg Tabs (Ropinirole hcl) .... One by mouth at bedtime 4)  Lamictal 100 Mg Tabs (Lamotrigine) .... Take 3 tablest by mouth once a day 5)  Seroquel 300 Mg Tabs (Quetiapine fumarate) .... Take one tablet by mouth three times daily 6)  Ambien 10 Mg Tabs (Zolpidem tartrate) .... Take 1 tab by mouth at bedtime 7)  Abilify 30 Mg Tabs (Aripiprazole) .... One tablet daily 8)  Ondansetron Hcl 4 Mg Tabs (Ondansetron hcl) .... Take 1 tablet by mouth every 6 hours as needed 9)  Omeprazole 40 Mg Cpdr (Omeprazole) .... Take 1 tablet by mouth once a day 10)  Docusate Sodium 100 Mg Caps (Docusate sodium) .... Take 1 capsule by mouth two times a day as needed 11)  Klonopin 1 Mg Tabs (Clonazepam) .... Take one to two tablets by mouth daily 12)  Cyclobenzaprine Hcl 10 Mg Tabs (Cyclobenzaprine hcl) .Marland Kitchen.. 1 by mouth 2 times daily as needed for back pain 13)  Vicodin 5-500 Mg Tabs (Hydrocodone-acetaminophen) .Marland Kitchen.. 1 tab every 6 hours as needed for pain  Other Orders: Est. Patient Level III (51884)  Patient  Instructions: 1)  Follow up with pulmonary as needed  Prescriptions: REQUIP 5 MG TABS (ROPINIROLE HCL) one by mouth at bedtime  #30 x 3   Entered and Authorized by:   Chesley Mires MD   Signed by:   Chesley Mires MD on 01/25/2010   Method used:   [facility]        [facility]  [address]       [address]       [place], [place]  [address]       Ph: [phone] or [phone]       Fax: [phone]   RxID:   [id number]

## 2010-07-06 NOTE — Assessment & Plan Note (Signed)
Summary: CPX/CLE   Vital Signs:  Patient profile:   60 year old female Height:      63 inches Weight:      197 pounds BMI:     35.02 Temp:     97.6 degrees F oral Pulse rate:   72 / minute Pulse rhythm:   regular BP sitting:   122 / 88  (right arm) Cuff size:   large  Vitals Entered By: Sherrian Divers CMA Deborra Medina) (February 24, 2010 8:29 AM) CC: adult physical   Vision Screening:Left eye with correction: 20 / 51 Right eye with correction: 20 / 40 Both eyes with correction: 20 / 30  Color vision testing: normal      Vision Entered By: Sherrian Divers CMA Deborra Medina) (February 24, 2010 8:44 AM)  Hearing Screen  20db HL: Left  500 hz: 20db 1000 hz: 20db 2000 hz: 20db 4000 hz: 20db Right  500 hz: 20db 1000 hz: 20db 2000 hz: 20db 4000 hz: 20db   Hearing Testing Entered By: Sherrian Divers CMA Deborra Medina) (February 24, 2010 8:44 AM)   History of Present Illness: Here for Medicare AWV:  1.   Risk factors based on Past M, S, F history: COPD, Bipolar disorder, Tobacco Abuse. Has had back and hip pain, followed by ortho.  Going to water aerobics that is helping.  Just had an MRI, does not have results yet.   2.   Physical Activities: not very physically active. 3.   Depression/mood: mood has been stable on Lamictal 300 mg daily, Abilify 30 mg daily, Seroquel 300 mg three times a day, and Klonopin 61m two times a day. 4.   Hearing: see nursing assessment 5.   ADL's: see geriatrics assessment. 6.   Fall Risk: she is a fall risk but has help at home. 7.   Home Safety: safe, single story. 8.   Height, weight, &visual acuity:see nursing assessment. 9.   Counseling: tobacco abuse, hyperlipidemia. 10.   Labs ordered based on risk factors: none indicated. 11.           Referral Coordination  UTD on prevention. 12.           Care Plan- continue to increase physical activity, work on diet. 13.            Cognitive Assessment- see geriatrics assessment.   Current Medications (verified): 1)   Vivelle-Dot 0.1 Mg/24hr Pttw (Estradiol) .... Twice A Week 2)  Celebrex 200 Mg Caps (Celecoxib) .... Take Twice Daily Tab By Mouth 3)  Requip 5 Mg Tabs (Ropinirole Hcl) .... One By Mouth At Bedtime 4)  Lamictal 100 Mg  Tabs (Lamotrigine) .... Take 3 Tablest By Mouth Once A Day 5)  Seroquel 300 Mg Tabs (Quetiapine Fumarate) .... Take One Tablet By Mouth Three Times Daily 6)  Ambien 10 Mg  Tabs (Zolpidem Tartrate) .... Take 1 Tab By Mouth At Bedtime 7)  Abilify 30 Mg Tabs (Aripiprazole) .... One Tablet Daily 8)  Ondansetron Hcl 4 Mg Tabs (Ondansetron Hcl) .... Take 1 Tablet By Mouth Every 6 Hours As Needed 9)  Omeprazole 40 Mg Cpdr (Omeprazole) .... Take 1 Tablet By Mouth Once A Day 10)  Docusate Sodium 100 Mg Caps (Docusate Sodium) .... Take 1 Capsule By Mouth Two Times A Day As Needed 11)  Klonopin 1 Mg Tabs (Clonazepam) .... Take One To Two Tablets By Mouth Daily 12)  Cyclobenzaprine Hcl 10 Mg  Tabs (Cyclobenzaprine Hcl) ..Marland Kitchen. 1 By Mouth 2 Times Daily As Needed For  Back Pain  Allergies (verified): No Known Drug Allergies  Past History:  Past Medical History: Last updated: 07/26/2007  COLONIC POLYPS, ADENOMATOUS (ICD-211.3) HEALTH SCREENING (ICD-V70.0) INSOMNIA (ICD-780.52) RESTLESS LEGS SYNDROME (ICD-333.94) POLYP, COLON (ICD-211.3) INCONTINENCE (ICD-788.30) HEPATITIS B, CHRONIC (ICD-070.32) Hx of RHEUMATIC FEVER (ICD-390) HYPERTENSION (ICD-401.9) HYPERLIPIDEMIA (ICD-272.4) COPD (ICD-496) DISORDER, NERVOUS SYSTEM NOS (ICD-349.9) BIPOLAR AFFECTIVE DISORDER, HX OF (ICD-V11.8) OBSTRUCTIVE SLEEP APNEA (ICD-327.23) CEREBROVASCULAR ACCIDENT, HX OF (ICD-V12.50)    Past Surgical History: Last updated: 04/22/2008 Back surgery Cholecystectomy Rotator Cuff Repair Total Abdominal Hysterectomy fusion in lumbar area 10/09 CT abd 04/09/08--fatty liver, no other findings  Family History: Last updated: 10/23/2008 No FH of Colon Cancer: Family History of Breast Cancer:Mother Lung  Cancer:Mother Family History of Stomach Cancer:Father Family History of Diabetes: 4 Aunts and a Uncle  Social History: Last updated: 10/23/2008 Marital Status: divorced Children: 1--son 20 yrs old Occupation: disability due to bipolar Patient currently smokes. 1/2 pack daily Alcohol Use - no Daily Caffeine Use: 2 cups daily Illicit Drug Use - no Patient does not get regular exercise.   Risk Factors: Caffeine Use: 3 (06/19/2008) Exercise: no (10/23/2008)  Risk Factors: Smoking Status: current (03/19/2009) Packs/Day: 1ppd (03/19/2008) Passive Smoke Exposure: yes (06/19/2008)  Review of Systems      See HPI General:  Denies malaise. Eyes:  Denies blurring. ENT:  Denies difficulty swallowing. CV:  Denies chest pain or discomfort. Resp:  Denies shortness of breath. GI:  Denies abdominal pain, bloody stools, and change in bowel habits. GU:  Denies abnormal vaginal bleeding. MS:  Denies joint pain, joint redness, and joint swelling. Derm:  Denies rash. Neuro:  Denies headaches. Psych:  Denies mental problems, panic attacks, sense of great danger, suicidal thoughts/plans, thoughts of violence, unusual visions or sounds, and thoughts /plans of harming others. Endo:  Denies cold intolerance and heat intolerance. Heme:  Denies abnormal bruising and bleeding.  Physical Exam  General:  alert, well-developed, well-nourished, and well-hydrated.    Head:  normocephalic and atraumatic.   Eyes:  vision grossly intact and pupils equal.   Ears:  R ear normal and L ear normal.   Nose:  no external deformity.   Mouth:  good dentition.   Neck:  No deformities, masses, or tenderness noted. Breasts:  No mass, nodules, thickening, tenderness, bulging, retraction, inflamation, nipple discharge or skin changes noted.   Lungs:  normal respiratory effort, no intercostal retractions, and no crackles.   Heart:  Normal rate and regular rhythm. S1 and S2 normal without gallop, murmur, click, rub or  other extra sounds. Abdomen:  soft, non-tender, normal bowel sounds, no distention, no masses, and no guarding.   Rectal:  No external abnormalities noted. Normal sphincter tone. No rectal masses or tenderness. Genitalia:  Pelvic Exam:        External: normal female genitalia without lesions or masses        Vagina: normal without lesions or masses        Cervix: absent        Adnexa: absent        Uterus: absent        Pap smear: performed Msk:  No deformity or scoliosis noted of thoracic or lumbar spine.   Extremities:  no edema Neurologic:  alert & oriented X3.   Skin:  Intact without suspicious lesions or rashes Psych:  Cognition and judgment appear intact. Alert and cooperative with normal attention span and concentration. No apparent delusions, illusions, hallucinations   Impression & Recommendations:  Problem # 1:  PREVENTIVE HEALTH CARE (ICD-V70.0) Reviewed preventive care protocols, scheduled due services, and updated immunizations Discussed nutrition, exercise, diet, and healthy lifestyle.  Orders: Pelvic & Breast Exam ( Medicare)  (G0101) Obtaining Screening PAP Smear (G8366) Medicare -1st Annual Wellness Visit [phone])  Complete Medication List: 1)  Vivelle-dot 0.1 Mg/24hr Pttw (Estradiol) .... Twice a week 2)  Celebrex 200 Mg Caps (Celecoxib) .... Take twice daily tab by mouth 3)  Requip 5 Mg Tabs (Ropinirole hcl) .... One by mouth at bedtime 4)  Lamictal 100 Mg Tabs (Lamotrigine) .... Take 3 tablest by mouth once a day 5)  Seroquel 300 Mg Tabs (Quetiapine fumarate) .... Take one tablet by mouth three times daily 6)  Ambien 10 Mg Tabs (Zolpidem tartrate) .... Take 1 tab by mouth at bedtime 7)  Abilify 30 Mg Tabs (Aripiprazole) .... One tablet daily 8)  Ondansetron Hcl 4 Mg Tabs (Ondansetron hcl) .... Take 1 tablet by mouth every 6 hours as needed 9)  Omeprazole 40 Mg Cpdr (Omeprazole) .... Take 1 tablet by mouth once a day 10)  Docusate Sodium 100 Mg Caps (Docusate  sodium) .... Take 1 capsule by mouth two times a day as needed 11)  Klonopin 1 Mg Tabs (Clonazepam) .... Take one to two tablets by mouth daily 12)  Cyclobenzaprine Hcl 10 Mg Tabs (Cyclobenzaprine hcl) .Marland Kitchen.. 1 by mouth 2 times daily as needed for back pain  Current Allergies (reviewed today): No known allergies   Last Flu Vaccine:  Fluvax 3+ (02/26/2008 10:38:00 AM) Flu Vaccine Result Date:  02/10/2010 Flu Vaccine Result:  historical Flex Sig Next Due:  Not Indicated Hemoccult Next Due:  Not Indicated Last PAP:  normal (07/06/2006 1:33:16 PM) PAP Next Due:  Not Indicated Last Mammogram:  normal (07/17/2007 8:22:38 AM) Mammogram Result Date:  01/01/2010 Mammogram Result:  normal    Geriatric Assessment:  Activities of Daily Living:    Bathing-independent    Dressing-independent    Eating-independent    Toileting-independent    Transferring-independent    Continence-independent  Instrumental Activities of Daily Living:    Transportation-independent    Meal/Food Preparation-independent    Shopping Errands-independent    Housekeeping/Chores-independent    Money Management/Finances-independent    Medication Management-independent    Ability to Use Telephone-independent    Laundry-independent  Mental Status Exam: (value/max value)    Orientation to Time: 5/5    Orientation to Place: 5/5    Registration: 3/3    Recall: 3/3    Language-name 2 objects: 2/2    Language-repeat: 1/1    Language-follow 3-step command: 3/3    Language-read and follow direction: 1/1    Write a sentence: 1/1    Copy design: 1/1 MSE Total score: 25/30

## 2010-07-06 NOTE — Progress Notes (Signed)
Summary: needs order for mammogram  Phone Note Call from Patient Call back at Home Phone [phone]   Caller: Patient Call For: [name] MD Summary of Call: Pt needs order for mammogram, she goes to [facility] breast center, is getting one every six months. Initial call taken by: Marty Heck CMA,  December 25, 2009 2:11 PM  New Problems: OTHER SCREENING MAMMOGRAM (ICD-V76.12)   New Problems: OTHER SCREENING MAMMOGRAM (ICD-V76.12)

## 2010-07-06 NOTE — Assessment & Plan Note (Signed)
Summary: TROUBLE WITH LEFT HIP   Vital Signs:  Patient profile:   60 year old female Height:      63 inches Weight:      191.25 pounds BMI:     34.00 Temp:     98.4 degrees F oral Pulse rate:   100 / minute Pulse rhythm:   regular BP sitting:   110 / 70  (left arm) Cuff size:   large  Vitals Entered By: Sherrian Divers CMA Deborra Medina) (November 06, 2009 2:46 PM) CC: left and right hip pain   History of Present Illness: 60 yo here for left hip pain.  Has always had some hip pain but over past week or two, pain also goes down left leg, past her knee. No tingling or LE weakness. No injury that she is aware of. Most painful to sit or when she first stands.  No urinary symptoms.  Current Medications (verified): 1)  Vivelle-Dot 0.1 Mg/24hr Pttw (Estradiol) .... Twice A Week 2)  Celebrex 200 Mg Caps (Celecoxib) .... Take Twice Daily Tab By Mouth 3)  Requip 4 Mg  Tabs (Ropinirole Hcl) .... One By Mouth At Bedtime 4)  Lamictal 100 Mg  Tabs (Lamotrigine) .... Take 3 Tablest By Mouth Once A Day 5)  Seroquel 300 Mg Tabs (Quetiapine Fumarate) .... Take One Tablet By Mouth Three Times Daily 6)  Ambien 10 Mg  Tabs (Zolpidem Tartrate) .... Take 1 Tab By Mouth At Bedtime 7)  Abilify 30 Mg Tabs (Aripiprazole) .... One Tablet Daily 8)  Ondansetron Hcl 4 Mg Tabs (Ondansetron Hcl) .... Take 1 Tablet By Mouth Every 6 Hours As Needed 9)  Omeprazole 40 Mg Cpdr (Omeprazole) .... Take 1 Tablet By Mouth Once A Day 10)  Docusate Sodium 100 Mg Caps (Docusate Sodium) .... Take 1 Capsule By Mouth Two Times A Day As Needed 11)  Klonopin 1 Mg Tabs (Clonazepam) .... Take One To Two Tablets By Mouth Daily 12)  Cyclobenzaprine Hcl 10 Mg  Tabs (Cyclobenzaprine Hcl) .Marland Kitchen.. 1 By Mouth 2 Times Daily As Needed For Back Pain  Allergies (verified): No Known Drug Allergies  Review of Systems      See HPI General:  Denies fever. GU:  Denies incontinence and urinary frequency. MS:  Complains of low back pain; denies muscle  weakness.  Physical Exam  General:  alert, well-developed, well-nourished, and well-hydrated.    Msk:  left tight paraspinous muscle, pos SLR, neg fabers. Normal strength Psych:  normally interactive, not anxious appearing, flat affect, and subdued.     Impression & Recommendations:  Problem # 1:  SCIATICA, LEFT (ICD-724.3) Assessment New  Time spent with patient 25 minutes, more than 50% of this time was spent counseling patient on sciatica and normal treatment and duration of symptoms.   Will try short course of Flexeril. Pt to come see me in 1-2 weeks.  Her updated medication list for this problem includes:    Celebrex 200 Mg Caps (Celecoxib) .Marland Kitchen... Take twice daily tab by mouth    Cyclobenzaprine Hcl 10 Mg Tabs (Cyclobenzaprine hcl) .Marland Kitchen... 1 by mouth 2 times daily as needed for back pain  Orders: Prescription Created Electronically [phone])  Complete Medication List: 1)  Vivelle-dot 0.1 Mg/24hr Pttw (Estradiol) .... Twice a week 2)  Celebrex 200 Mg Caps (Celecoxib) .... Take twice daily tab by mouth 3)  Requip 4 Mg Tabs (Ropinirole hcl) .... One by mouth at bedtime 4)  Lamictal 100 Mg Tabs (Lamotrigine) .... Take 3 tablest by  mouth once a day 5)  Seroquel 300 Mg Tabs (Quetiapine fumarate) .... Take one tablet by mouth three times daily 6)  Ambien 10 Mg Tabs (Zolpidem tartrate) .... Take 1 tab by mouth at bedtime 7)  Abilify 30 Mg Tabs (Aripiprazole) .... One tablet daily 8)  Ondansetron Hcl 4 Mg Tabs (Ondansetron hcl) .... Take 1 tablet by mouth every 6 hours as needed 9)  Omeprazole 40 Mg Cpdr (Omeprazole) .... Take 1 tablet by mouth once a day 10)  Docusate Sodium 100 Mg Caps (Docusate sodium) .... Take 1 capsule by mouth two times a day as needed 11)  Klonopin 1 Mg Tabs (Clonazepam) .... Take one to two tablets by mouth daily 12)  Cyclobenzaprine Hcl 10 Mg Tabs (Cyclobenzaprine hcl) .Marland Kitchen.. 1 by mouth 2 times daily as needed for back pain  Patient Instructions: 1)  Most  patients (90%) with low back pain will improve with time ( 2-6 weeks). Keep active but avoid activities that are painful. Apply moist heat and/or ice to lower back several times a day.  2)  Take the flexeril as prescribed and make appointment on your way out for 1-2 weeks. Prescriptions: CYCLOBENZAPRINE HCL 10 MG  TABS (CYCLOBENZAPRINE HCL) 1 by mouth 2 times daily as needed for back pain  #20 x 0   Entered and Authorized by:   [name] MD   Signed by:   [name] MD on 11/06/2009   Method used:   [facility]  [address] [place] (retail[facility]       [place], [place]  [address]       Ph: [phone] or [phone]       Fax: [phone]   RxID:   [id number]   Current Allergies (reviewed today): No known allergies

## 2010-07-06 NOTE — Progress Notes (Signed)
----   Converted from flag ---- ---- 02/17/2010 8:50 AM, [name] MD wrote: BMET, fasting lipid panel (272.4)  ---- 02/17/2010 8:43 AM, Lacretia Nicks wrote: Patient came in this morning, what labs do we need to order? Thanks. ------------------------------

## 2010-07-06 NOTE — Medication Information (Signed)
Summary: Medication Safety Program Letter/[employer]  Medication Safety Program Letter/[employer]   Imported By: Edmonia James 10/27/2009 10:37:11  _____________________________________________________________________  External Attachment:    Type:   Image     Comment:   External Document

## 2010-07-06 NOTE — Letter (Signed)
Summary: [facility]   Imported By: Edmonia James 10/15/2009 10:47:08  _____________________________________________________________________  External Attachment:    Type:   Image     Comment:   External Document

## 2010-07-06 NOTE — Procedures (Signed)
Summary: Upper Endoscopy by Dr.Robert [facility]  Upper Endoscopy by Dr.Robert [facility]   Imported By: Virgia Land 09/14/2009 16:45:29  _____________________________________________________________________  External Attachment:    Type:   Image     Comment:   External Document  Appended Document: Upper Endoscopy by Dr.Robert [facility] hiatal hernia, gastritis.

## 2010-07-06 NOTE — Miscellaneous (Signed)
Summary: Orders Update   Clinical Lists Changes  Orders: Added new Referral order of Radiology Referral (Radiology) - Signed 

## 2010-07-06 NOTE — Progress Notes (Signed)
Summary: Records to be sent to [place].  Phone Note Call from Patient Call back at Work Phone [phone]   Caller: Patient Call For: [name] MD Summary of Call: Patient has an appt with [facility] for a second opinion.  She would like her records including MRI faxed to there office at [phone].  What notes, records, labs would you like sent?  Please advise. Initial call taken by: Sherrian Divers CMA Deborra Medina),  January 05, 2010 1:59 PM  Follow-up for Phone Call        MRI and Xray from 6/15.   Thanks, Everlean Cherry MD  January 06, 2010 7:33 AM  MRI and xray faxed to [facility].  Follow-up by: Sherrian Divers CMA Deborra Medina),  January 06, 2010 9:05 AM

## 2010-07-06 NOTE — Procedures (Signed)
Summary: Colonoscopy by Dr.Robert Vira Agar  Colonoscopy by Dr.Robert Vira Agar   Imported By: Virgia Land 09/14/2009 16:43:47  _____________________________________________________________________  External Attachment:    Type:   Image     Comment:   External Document  Appended Document: Colonoscopy by Dr.Robert Vira Agar inflammation of descending colon, ?ischemic colitis.  Awaiting path.

## 2010-07-06 NOTE — Progress Notes (Signed)
Summary: Zofran denied   Phone Note Outgoing Call Call back at [facility] Phone [phone]   Call placed by: Genella Mech CMA Deborra Medina),  April 13, 2010 1:52 PM Summary of Call: Called pt to inform pts insurance has denied her Zofran. Need pt to return call to see if she needs to have Phenergan sent in Initial call taken by: Genella Mech CMA Deborra Medina),  April 13, 2010 1:53 PM  Follow-up for Phone Call        Tried to contact pt , No Answer will wait to see if pt returns call about her Zofran. Follow-up by: Genella Mech CMA Deborra Medina),  April 15, 2010 2:25 PM

## 2010-07-06 NOTE — Progress Notes (Signed)
Summary: wants to quit smoking  Phone Note Call from Patient Call back at Home Phone [phone]   Caller: Patient Call For: Stephanie Norris MD Summary of Call: Pt is calling to ask what she can take or use to quit smoking.  She is concerned about how these products would react with her other meds and her depression.  [facility]. Initial call taken by: Marty Heck CMA,  December 15, 2009 8:05 AM  Follow-up for Phone Call        Given the medicaitons she is on, it would be difficult to find something that does not interact.  We could try Chantix if she has never had suicidal thoughts. Follow-up by: Stephanie Norris MD,  December 15, 2009 8:09 AM  Additional Follow-up for Phone Call Additional follow up Details #1::        Patient advised as instructed via telephone.  She appreciates our efforts in trying to find something that will work for her but she doesn't want Chantix to effect how her other medications work, will try to stop on her own. Additional Follow-up by: Sherrian Divers CMA [facility]),  December 15, 2009 8:25 AM

## 2010-07-06 NOTE — Progress Notes (Signed)
Summary: med ?'s  Medications Added PROMETHAZINE HCL 25 MG  TABS (PROMETHAZINE HCL) 1/2-1 by mouth every 8 hours as needed for nausea       Phone Note Call from Patient Call back at Home Phone [phone]   Caller: Patient Call For: Dr. Deatra Ina Reason for Call: Talk to Nurse Summary of Call: ?'s regarding meds and ins coverage and possibly switching meds Initial call taken by: Lucien Mons,  April 19, 2010 1:14 PM  Follow-up for Phone Call        Dr Carlean Purl, You are the Doc of the day, This pts insurance will not pay for Zofran can we send her in Promethazine? and how do you want to prescribe? Follow-up by: Genella Mech CMA Deborra Medina),  April 19, 2010 1:51 PM  Additional Follow-up for Phone Call Additional follow up Details #1::        see rx - done 1 time only now she needs to follow through with Dr. Guillermina [place] recommendations [id number] with psychiatrist on meds Additional Follow-up by: Gatha Mayer MD, Marval Regal,  April 19, 2010 3:37 PM    Additional Follow-up for Phone Call Additional follow up Details #2::    Called and spoke with pt, She stated her doctors was working on her meds, to try and resolve the nausea. Pt will return call back if needed. Follow-up by: Genella Mech CMA Deborra Medina),  April 19, 2010 3:43 PM  New/Updated Medications: PROMETHAZINE HCL 25 MG  TABS (PROMETHAZINE HCL) 1/2-1 by mouth every 8 hours as needed for nausea Prescriptions: PROMETHAZINE HCL 25 MG  TABS (PROMETHAZINE HCL) 1/2-1 by mouth every 8 hours as needed for nausea  #30 x 0   Entered and Authorized by:   Gatha Mayer MD, [facility]   Signed by:   Gatha Mayer MD, FACG on 04/19/2010   Method used:   [facility]        [facility]  [address] [facility]ail[facility]       [address]       [place], [place]  [address]       Ph: [phone] or [phone]       Fax: [phone]   RxID:   [id number]

## 2010-07-06 NOTE — Miscellaneous (Signed)
Summary: Controlled Substance Agreement  Controlled Substance Agreement   Imported By: Edmonia James 11/13/2009 08:20:27  _____________________________________________________________________  External Attachment:    Type:   Image     Comment:   External Document

## 2010-07-06 NOTE — Progress Notes (Signed)
Summary: refill request for flexeril  Phone Note Refill Request Call back at Home Phone [phone] Message from:  Patient  Refills Requested: Medication #1:  CYCLOBENZAPRINE HCL 10 MG  TABS 1 by mouth 2 times daily as needed for back pain Pt is requesting a refill.  She is seeing an ortho for her back on 10/14 and is asking if she can continue with the flexeril until then, since it is helping.  She said she is not taking any pain meds right now.  [facility]  Initial call taken by: Marty Heck CMA,  February 15, 2010 12:34 PM    Prescriptions: CYCLOBENZAPRINE HCL 10 MG  TABS (CYCLOBENZAPRINE HCL) 1 by mouth 2 times daily as needed for back pain  #60 x 0   Entered and Authorized by:   [name] MD   Signed by:   [name] MD on 02/15/2010   Method used:   [facility]        [facility]  [address] (retail)       [address]       [place], [place]  [address]       Ph: [phone] or [phone]       Fax: [phone]   RxID:   [id number]

## 2010-07-06 NOTE — Assessment & Plan Note (Signed)
Summary: [name]    History of Present Illness Primary GI MD: Erskine Emery MD [facility] Primary Stephanie [name]: Stephanie Norris MD Chief Complaint: Intermittant epigastric pain with constant nausea. Pt denies any loss of appetitie and vomiting.  History of Present Illness:   [name] has returned for reevaluation of nausea.  Chronic nausea continues to be a problem.  It is unrelated to eating.  It is occasionally preceded by mild upper midepigastric pain for which she was hospitalized at [name]  in March, 2011 for what turned out to be ischemic colitis.  At that time she had pain, nausea, diarrhea and bleeding.  Gastric empty scan in the past has been negative.  Upper endoscopy in March demonstrated gastritis only.  She continues on multiple medications for her bipolar disorder and uses an estrogen patch.   GI Review of Systems    Reports abdominal pain and  nausea.     Location of  Abdominal pain: epigastric area.    Denies acid reflux, belching, bloating, chest pain, dysphagia with liquids, dysphagia with solids, heartburn, loss of appetite, vomiting, vomiting blood, weight loss, and  weight gain.        Denies anal fissure, black tarry stools, change in bowel habit, constipation, diarrhea, diverticulosis, fecal incontinence, heme positive stool, hemorrhoids, irritable bowel syndrome, jaundice, light color stool, liver problems, rectal bleeding, and  rectal pain.    Current Medications (verified): 1)  Vivelle-Dot 0.1 Mg/24hr Pttw (Estradiol) .... Twice A Week 2)  Requip 5 Mg Tabs (Ropinirole Hcl) .... One By Mouth At Bedtime 3)  Lamictal 100 Mg  Tabs (Lamotrigine) .... Take 3 Tablest By Mouth Once A Day 4)  Seroquel 300 Mg Tabs (Quetiapine Fumarate) .... Take One Tablet By Mouth Three Times Daily 5)  Ambien 10 Mg  Tabs (Zolpidem Tartrate) .... Take 1 Tab By Mouth At Bedtime 6)  Abilify 30 Mg Tabs (Aripiprazole) .... One Tablet Daily 7)  Ondansetron Hcl 4 Mg Tabs (Ondansetron Hcl) .... Take 1  Tablet By Mouth Every 6 Hours As Needed 8)  Omeprazole 40 Mg Cpdr (Omeprazole) .... Take 1 Tablet By Mouth Once A Day 9)  Docusate Sodium 100 Mg Caps (Docusate Sodium) .... Take 1 Capsule By Mouth Two Times A Day As Needed 10)  Klonopin 1 Mg Tabs (Clonazepam) .... Take One To Two Tablets By Mouth Daily 11)  Cyclobenzaprine Hcl 10 Mg  Tabs (Cyclobenzaprine Hcl) .Marland Kitchen.. 1 By Mouth 2 Times Daily As Needed For Back Pain 12)  Nabumetone 750 Mg Tabs (Nabumetone) .... One Tablet By Mouth Two Times A Day  Allergies (verified): No Known Drug Allergies  Past History:  Past Medical History: COLONIC POLYPS, ADENOMATOUS (ICD-211.3) HEALTH SCREENING (ICD-V70.0) INSOMNIA (ICD-780.52) RESTLESS LEGS SYNDROME (ICD-333.94) POLYP, COLON (ICD-211.3) INCONTINENCE (ICD-788.30) HEPATITIS B, CHRONIC (ICD-070.32) Hx of RHEUMATIC FEVER (ICD-390) HYPERTENSION (ICD-401.9) HYPERLIPIDEMIA (ICD-272.4) COPD (ICD-496) DISORDER, NERVOUS SYSTEM NOS (ICD-349.9) BIPOLAR AFFECTIVE DISORDER, HX OF (ICD-V11.8) OBSTRUCTIVE SLEEP APNEA (ICD-327.23) CEREBROVASCULAR ACCIDENT, HX OF (ICD-V12.50)    Past Surgical History: Back surgery Cholecystectomy Rotator Cuff Repair Total Abdominal Hysterectomy fusion in lumbar area 10/09 CT abd 04/09/08--fatty liver, no other findings Hemorrhoidectomy and colon polyp removed  Family History: Reviewed history from 10/23/2008 and no changes required. No FH of Colon Cancer: Family History of Breast Cancer:Mother Lung Cancer:Mother Family History of Stomach Cancer:Father Family History of Diabetes: 4 Aunts and a Uncle  Social History: Reviewed history from 10/23/2008 and no changes required. Marital Status: divorced Children: 1--son 38 yrs old Occupation: disability due to  bipolar Patient currently smokes. 1/2 pack daily Alcohol Use - no Daily Caffeine Use: 2 cups daily Illicit Drug Use - no Patient does not get regular exercise.   Review of Systems  The patient denies  allergy/sinus, anemia, anxiety-new, arthritis/joint pain, back pain, blood in urine, breast changes/lumps, change in vision, confusion, cough, coughing up blood, depression-new, fainting, fatigue, fever, headaches-new, hearing problems, heart murmur, heart rhythm changes, itching, menstrual pain, muscle pains/cramps, night sweats, nosebleeds, pregnancy symptoms, shortness of breath, skin rash, sleeping problems, sore throat, swelling of feet/legs, swollen lymph glands, thirst - excessive , urination - excessive , urination changes/pain, urine leakage, vision changes, and voice change.    Vital Signs:  Patient profile:   60 year old female Height:      63 inches Weight:      196.13 pounds Pulse rate:   80 / minute Pulse rhythm:   regular BP sitting:   132 / 78  (right arm) Cuff size:   regular  Vitals Entered By: June McMurray [place] Deborra Medina) (March 18, 2010 9:48 AM)  Physical Exam  Additional Exam:  On physical exam she is a mildly obese female  skin: anicteric HEENT: normocephalic; PEERLA; no nasal or pharyngeal abnormalities neck: supple nodes: no cervical lymphadenopathy chest: clear to ausculatation and percussion heart: no murmurs, gallops, or rubs abd: soft, nontender; BS normoactive; no abdominal masses,  organomegaly; there is mild tenderness to palpation in the right upper quadrant and midepigastric areas; there is a prominent venous pattern over her abdomen rectal: deferred ext: no cynanosis, clubbing, edema skeletal: no deformities neuro: oriented x 3; no focal abnormalities    Impression & Recommendations:  Problem # 1:  NAUSEA ALONE (ICD-787.02) I strongly suspect this is related  to her medications, albeit a  single medications or her polypharmacy.  Recommendations #1 hold estrogen patch; if she is not improve within the next 2 weeks then I will confer with her psychiatrist to see whether her psychiatric medications can be held or switched around  Problem # 2:   PERSONAL HISTORY OF COLONIC POLYPS (ICD-V12.72) Colon polyp was removed in March, 2011.  She will require followup colonoscopy in 5 years.  Patient Instructions: 1)  Copy sent to : Stephanie Norris MD 2)  Call back in a couple of weeks to let us know how your doing 3)  The medication list was reviewed and reconciled.  All changed / newly prescribed medications were explained.  A complete medication list was provided to the patient / caregiver.

## 2010-07-06 NOTE — Progress Notes (Signed)
Summary: flexeril not helping  Phone Note Call from Patient Call back at Work Phone [phone]   Caller: Patient Call For: [name] MD Summary of Call: Pt states she was prescribed flexeril for back spasms.  This is not helping.  She is taking one two times a day.  Should she take more of this or change to something else.  [facility] [place]   Initial call taken by: Marty Heck CMA,  November 10, 2009 10:13 AM  Follow-up for Phone Call        try taking three times a day, if not helping then needs to be seen. [name] MD  November 10, 2009 10:15 AM  Patient advised as instructed via telephone.  She would like another refill called to pharmacy because she will run out before her f/u appt.  Please advise.  Sherrian Divers CMA Deborra Medina)  November 10, 2009 10:22 AM     Prescriptions: CYCLOBENZAPRINE HCL 10 MG  TABS (CYCLOBENZAPRINE HCL) 1 by mouth 2 times daily as needed for back pain  #60 x 0   Entered and Authorized by:   [name] MD   Signed by:   [name] MD on 11/10/2009   Method used:   [facility]        [facility]  [address] [place] (retail)       [place], [place]  [address]       Ph: [phone] or [phone]       Fax: [phone]   RxID:   [id number]   Appended Document: flexeril not helping Patient notified, Rx sent to pharmacy.  Appended Document: flexeril not helping Dr. Deborra Medina, I changed flexeril script to 3 times a day because her insurance wouldnt pay if instructions stayed at 2 a day.  Pt knows to follow up if not better.

## 2010-07-06 NOTE — Progress Notes (Signed)
Summary: refill request for vicodin  Phone Note Refill Request Call back at Home Phone [phone] Message from:  Patient  Refills Requested: Medication #1:  VICODIN 5-500 MG TABS 1 tab every 6 hours as needed for pain. Phoned request from pt, please call to [place].  Pt is seeing her back doctor on 7/6.  Initial call taken by: Marty Heck CMA,  December 04, 2009 3:10 PM  Follow-up for Phone Call        Rx called to pharmacy Follow-up by: Sherrian Divers CMA Deborra Medina),  December 04, 2009 3:30 PM    Prescriptions: VICODIN 5-500 MG TABS (HYDROCODONE-ACETAMINOPHEN) 1 tab every 6 hours as needed for pain  #30 x 0   Entered and Authorized by:   [name] MD   Signed by:   [name] MD on 12/04/2009   Method used:   Telephoned to ...       [facility]  [address] (retail)       [address]       [place], [place]  [address]       Ph: [phone] or [phone]       Fax: [phone]   RxID:   [id number]

## 2010-07-06 NOTE — Assessment & Plan Note (Signed)
Summary: ROA FOR 2 WEEK FOLLOW-UP/JRR   Vital Signs:  Patient profile:   60 year old female Height:      63 inches Weight:      191.25 pounds BMI:     34.00 Temp:     97.6 degrees F oral Pulse rate:   80 / minute Pulse rhythm:   regular BP sitting:   110 / 80  (left arm) Cuff size:   large  Vitals Entered By: Sherrian Divers CMA Deborra Medina) (November 16, 2009 8:48 AM) CC: 2 week follow up    History of Present Illness: 60 yo here for two week follow up left hip pain.  Has always had some hip pain but over month, pain also goes down left leg, past her knee. No tingling or LE weakness. No injury that she is aware of. Most painful to sit or when she first stands.  No urinary symptoms.  Given flexeril at last office vist, helps for a few hours but symptoms always return. Feels pain is getting worse.  Current Medications (verified): 1)  Vivelle-Dot 0.1 Mg/24hr Pttw (Estradiol) .... Twice A Week 2)  Celebrex 200 Mg Caps (Celecoxib) .... Take Twice Daily Tab By Mouth 3)  Requip 4 Mg  Tabs (Ropinirole Hcl) .... One By Mouth At Bedtime 4)  Lamictal 100 Mg  Tabs (Lamotrigine) .... Take 3 Tablest By Mouth Once A Day 5)  Seroquel 300 Mg Tabs (Quetiapine Fumarate) .... Take One Tablet By Mouth Three Times Daily 6)  Ambien 10 Mg  Tabs (Zolpidem Tartrate) .... Take 1 Tab By Mouth At Bedtime 7)  Abilify 30 Mg Tabs (Aripiprazole) .... One Tablet Daily 8)  Ondansetron Hcl 4 Mg Tabs (Ondansetron Hcl) .... Take 1 Tablet By Mouth Every 6 Hours As Needed 9)  Omeprazole 40 Mg Cpdr (Omeprazole) .... Take 1 Tablet By Mouth Once A Day 10)  Docusate Sodium 100 Mg Caps (Docusate Sodium) .... Take 1 Capsule By Mouth Two Times A Day As Needed 11)  Klonopin 1 Mg Tabs (Clonazepam) .... Take One To Two Tablets By Mouth Daily 12)  Cyclobenzaprine Hcl 10 Mg  Tabs (Cyclobenzaprine Hcl) .Marland Kitchen.. 1 By Mouth 2 Times Daily As Needed For Back Pain 13)  Vicodin 5-500 Mg Tabs (Hydrocodone-Acetaminophen) .Marland Kitchen.. 1 Tab Every 6 Hours  As Needed For Pain  Allergies (verified): No Known Drug Allergies  Review of Systems      See HPI General:  Denies chills, fatigue, and fever. GU:  Denies incontinence and urinary hesitancy. MS:  Complains of low back pain; denies muscle weakness.  Physical Exam  General:  alert, well-developed, well-nourished, and well-hydrated.    Msk:  left tight paraspinous muscle, pos SLR, neg fabers. Normal strength Neurologic:  alert & oriented X3.  gait normal.   Skin:  3 cm open abscess in left inguinal region, actively draining yellowish/bloody pus, surrounding warmth and erythema, not spreading.   Impression & Recommendations:  Problem # 1:  SCIATICA, LEFT (ICD-724.3) Assessment Deteriorated Discussed that sciatica can takes weeks to months to resolve but she feels it is getting worse. Will get lumbar plain films today to rule out and vertebral or bony pathology, continue flexeril. Vicodin as needed pain although I explained narcotics are not long term treatment for sciatica. Pt in agreement with plain. Her updated medication list for this problem includes:    Celebrex 200 Mg Caps (Celecoxib) .Marland Kitchen... Take twice daily tab by mouth    Cyclobenzaprine Hcl 10 Mg Tabs (Cyclobenzaprine hcl) .Marland Kitchen... 1 by  mouth 2 times daily as needed for back pain    Vicodin 5-500 Mg Tabs (Hydrocodone-acetaminophen) .Marland Kitchen... 1 tab every 6 hours as needed for pain  Orders: T-Lumbar Spine Complete, 5 Views [id number]) Prescription Created Electronically [id number])  Complete Medication List: 1)  Vivelle-dot 0.1 Mg/24hr Pttw (Estradiol) .... Twice a week 2)  Celebrex 200 Mg Caps (Celecoxib) .... Take twice daily tab by mouth 3)  Requip 4 Mg Tabs (Ropinirole hcl) .... One by mouth at bedtime 4)  Lamictal 100 Mg Tabs (Lamotrigine) .... Take 3 tablest by mouth once a day 5)  Seroquel 300 Mg Tabs (Quetiapine fumarate) .... Take one tablet by mouth three times daily 6)  Ambien 10 Mg Tabs (Zolpidem tartrate) .... Take 1  tab by mouth at bedtime 7)  Abilify 30 Mg Tabs (Aripiprazole) .... One tablet daily 8)  Ondansetron Hcl 4 Mg Tabs (Ondansetron hcl) .... Take 1 tablet by mouth every 6 hours as needed 9)  Omeprazole 40 Mg Cpdr (Omeprazole) .... Take 1 tablet by mouth once a day 10)  Docusate Sodium 100 Mg Caps (Docusate sodium) .... Take 1 capsule by mouth two times a day as needed 11)  Klonopin 1 Mg Tabs (Clonazepam) .... Take one to two tablets by mouth daily 12)  Cyclobenzaprine Hcl 10 Mg Tabs (Cyclobenzaprine hcl) .Marland Kitchen.. 1 by mouth 2 times daily as needed for back pain 13)  Vicodin 5-500 Mg Tabs (Hydrocodone-acetaminophen) .Marland Kitchen.. 1 tab every 6 hours as needed for pain Prescriptions: CYCLOBENZAPRINE HCL 10 MG  TABS (CYCLOBENZAPRINE HCL) 1 by mouth 2 times daily as needed for back pain  #60 x 0   Entered and Authorized by:   [name] MD   Signed by:   [name] MD on 11/16/2009   Method used:   [facility]        [facility]  [address] [facility]       [place], [place]  [address]       Ph: [phone] or [phone]       Fax: [phone]   RxID:   [id number] VICODIN 5-500 MG TABS (HYDROCODONE-ACETAMINOPHEN) 1 tab every 6 hours as needed for pain  #30 x 0   Entered and Authorized by:   [name] MD   Signed by:   [name] MD on 11/16/2009   Method used:   Print then Give to Patient   RxID:   [id number]   Current Allergies (reviewed today): No known allergies

## 2010-07-06 NOTE — Letter (Signed)
Summary: Results Follow up Letter  [place] [facility]  [address] [place], [place] [address]   Phone: [phone]  Fax: [phone]    03/01/2010 MRN: [id number]  [place] [address] [place],   [address]  Dear Stephanie [name],  The following are the results of your recent test(s):  Test         Result    Pap Smear:        Normal __X___  Not Normal _____ Comments: ______________________________________________________ Cholesterol: LDL(Bad cholesterol):         Your goal is less than:         HDL (Good cholesterol):       Your goal is more than: Comments:  ______________________________________________________ Mammogram:        Normal _____  Not Normal _____ Comments:  ___________________________________________________________________ Hemoccult:        Normal _____  Not normal _______ Comments:    _____________________________________________________________________ Other Tests:    We routinely do not discuss normal results over the telephone.  If you desire a copy of the results, or you have any questions about this information we can discuss them at your next office visit.   Sincerely,      [name], MD

## 2010-07-06 NOTE — Progress Notes (Signed)
Summary: Condition update   Phone Note Call from Patient Call back at Home Phone [phone]   Caller: Patient Call For: Dr. Deatra Ina Reason for Call: Talk to Nurse Summary of Call: Update: went off the Vivelle and it still caused night sweats and nausea. Started back on the [place] this past Saturday Initial call taken by: Webb Laws,  April 05, 2010 8:59 AM  Follow-up for Phone Call        Patient  wanted Dr Deatra Ina to know that there was no difference in her nausea off of Vivelle Patch.  She resumed it on Saturday.  She is asking for additional suggestions and a refillof her nausea medications.  Dr Deatra Ina please advise. Follow-up by: Barb Merino RN, [place],  April 05, 2010 9:14 AM  Additional Follow-up for Phone Call Additional follow up Details #1::        ok to renew antinausea meds ask pt to call psychiatrist to see if he can hold 1 medication at a time b/c of possible side effect of nausea Additional Follow-up by: Inda Castle MD,  April 05, 2010 9:17 AM    Additional Follow-up for Phone Call Additional follow up Details #2::    Patient advised of Dr Kelby Fam recommendations.  She will contact her psychiatist about stopping one psych med at at time to check for nausea side effects. Follow-up by: Barb Merino RN, [place],  April 05, 2010 9:30 AM

## 2010-07-07 ENCOUNTER — Encounter: Payer: Self-pay | Admitting: Orthopedic Surgery

## 2010-07-07 IMAGING — CR DG LUMBAR SPINE COMPLETE 4+V
6 series · 8 of 9 positions shown · non-contrast
Comparison: None.

CLINICAL DATA: Sciatica.  Left.  History back surgery.

LUMBAR SPINE - COMPLETE 4+ VIEW

[view not recorded (1 of 6)]
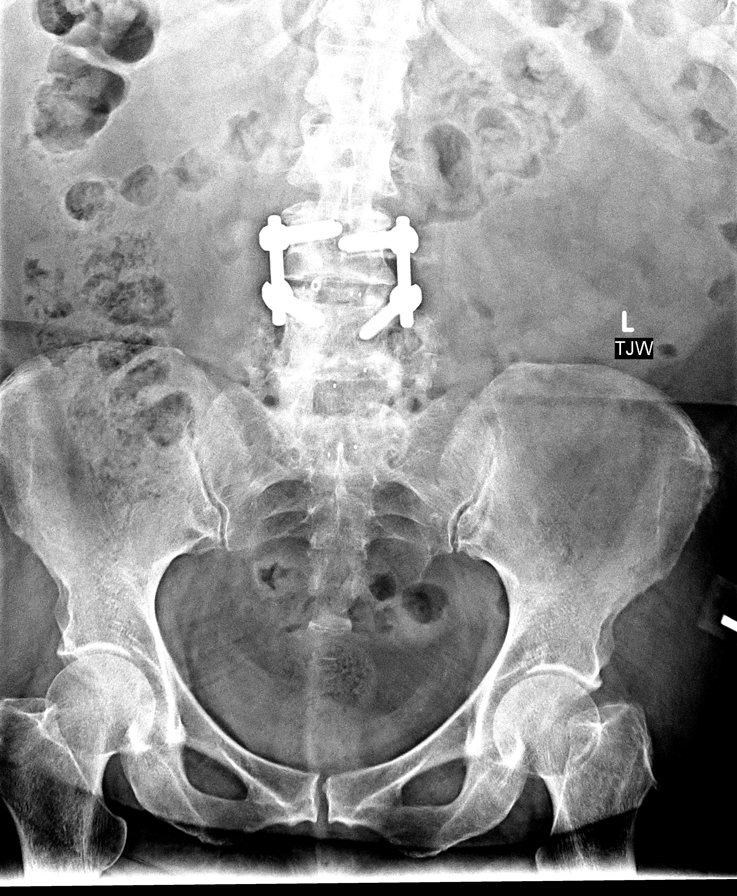

[view not recorded (2 of 6)]
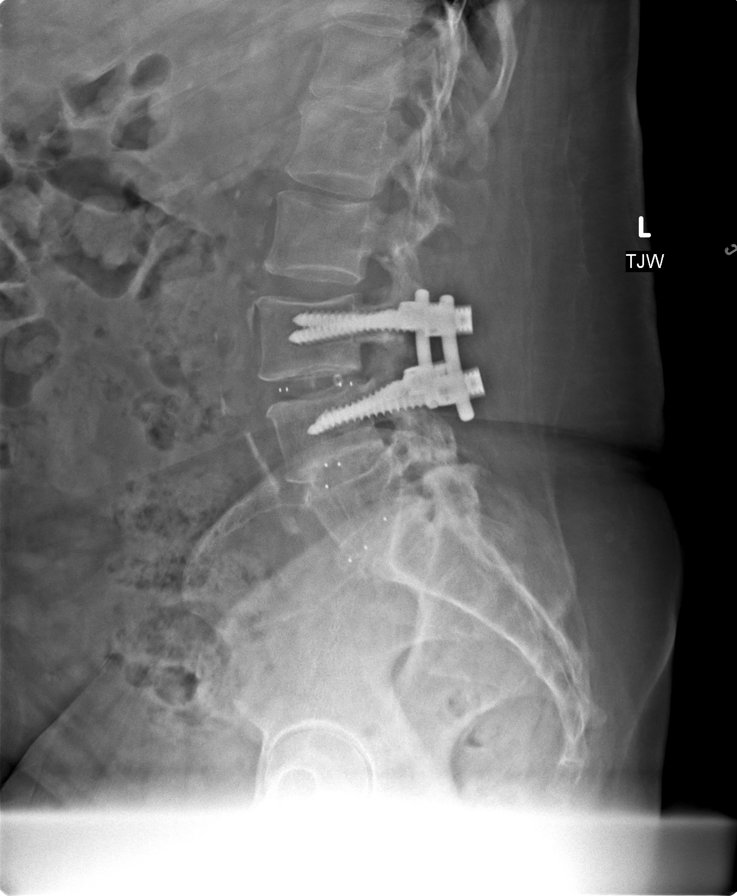

[view not recorded (3 of 6)]
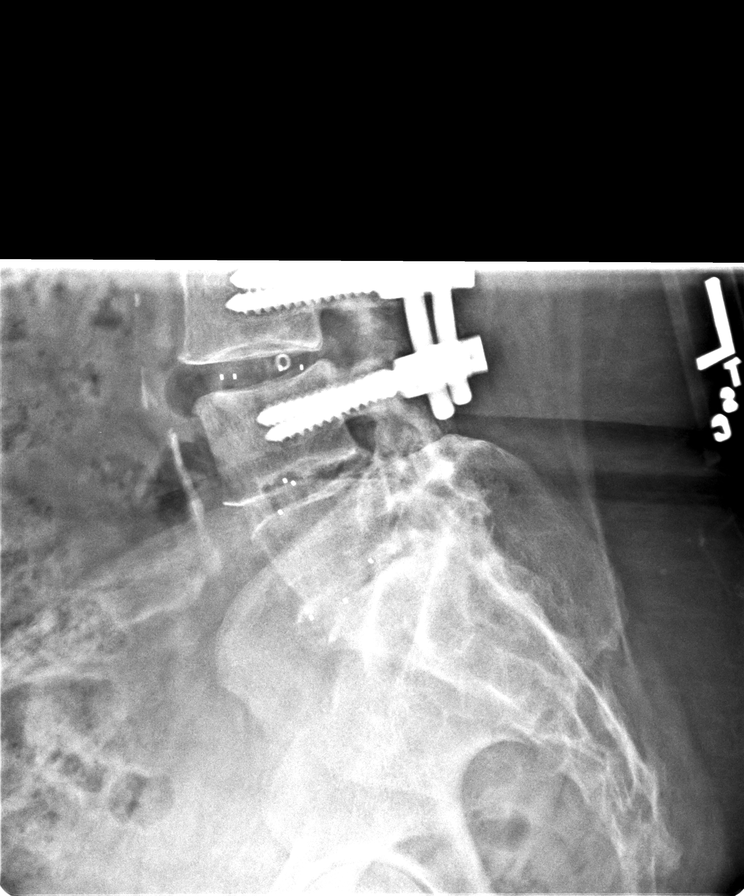

[Series 1005: view not recorded · 0.10mm/px · 2 of 2 slices shown (4 of 6)]
[im 1/2]
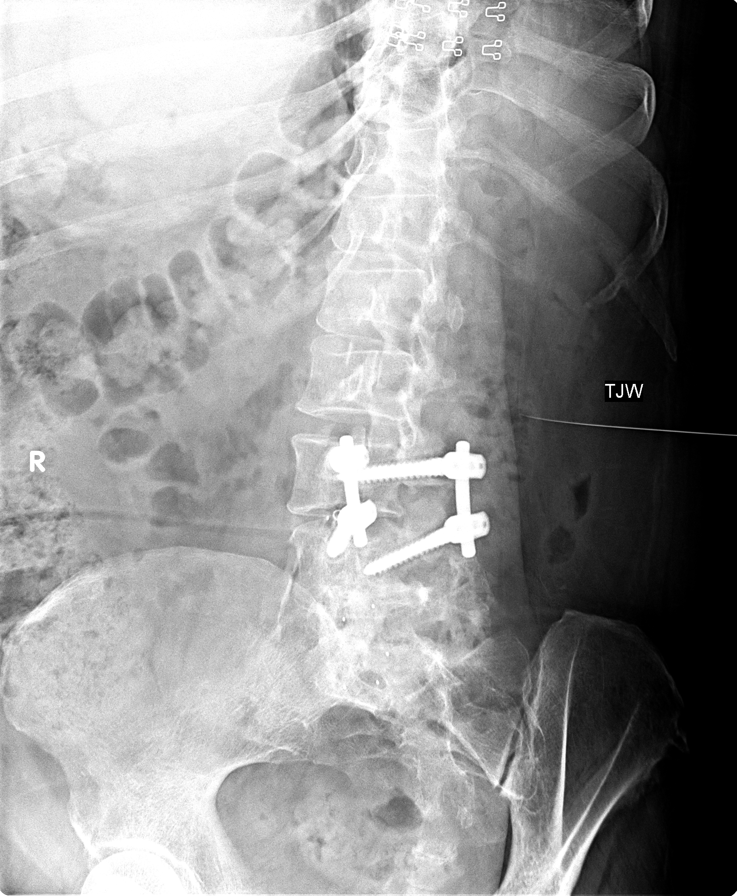
[im 2/2]
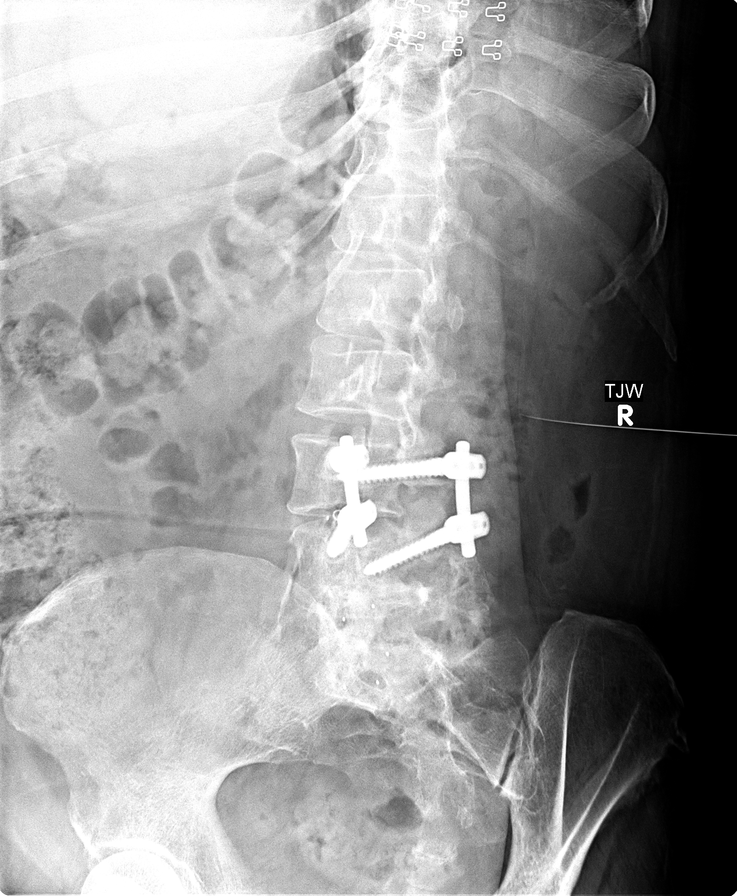

[Series 1006: view not recorded · 0.10mm/px · 2 of 2 slices shown (5 of 6)]
[im 1/2]
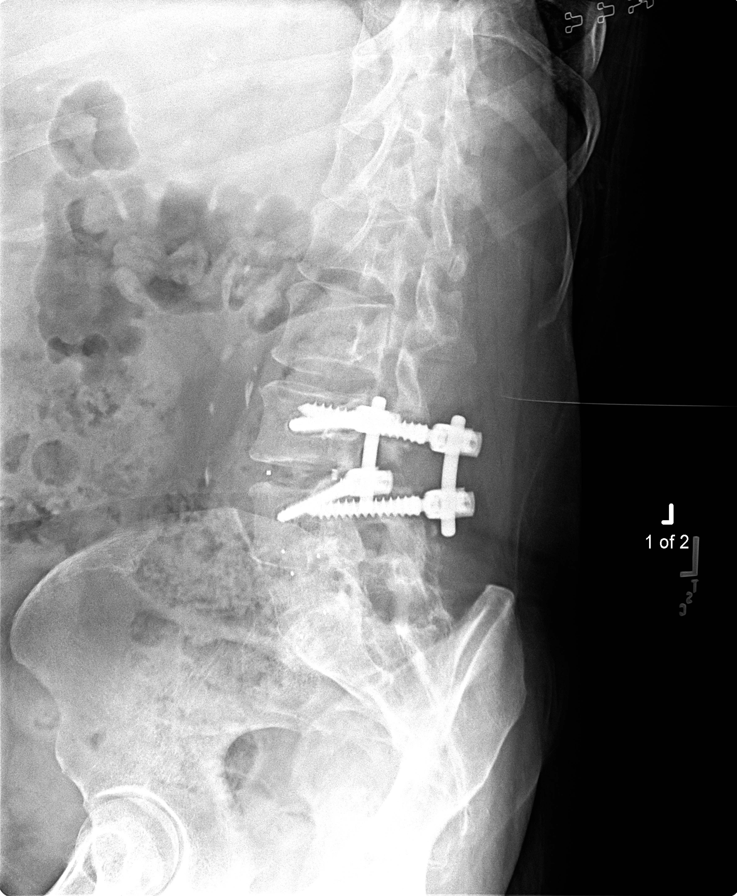
[im 2/2]
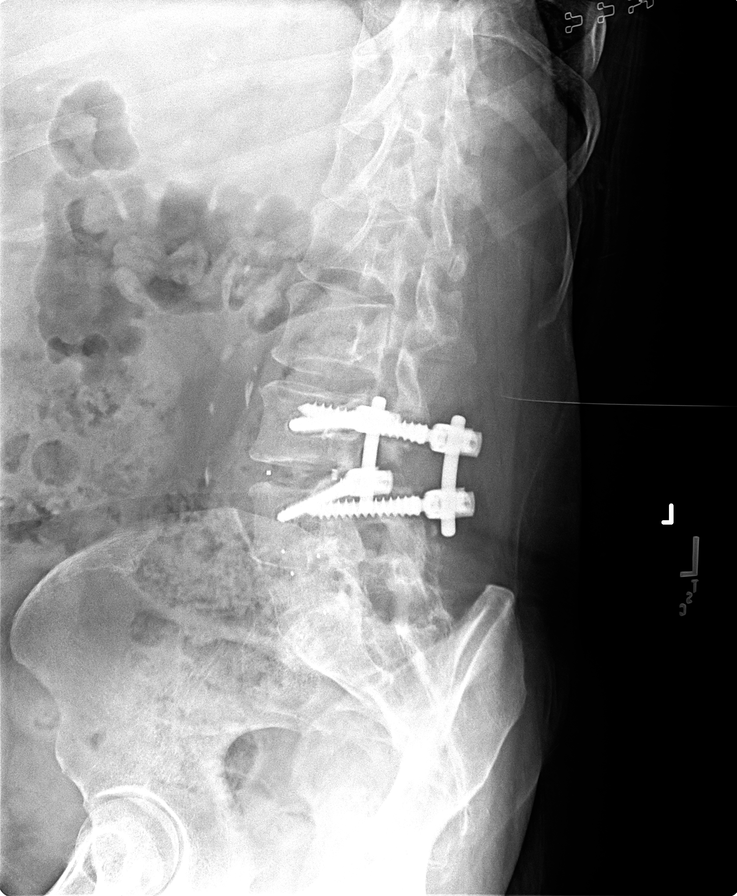

[view not recorded (6 of 6)]
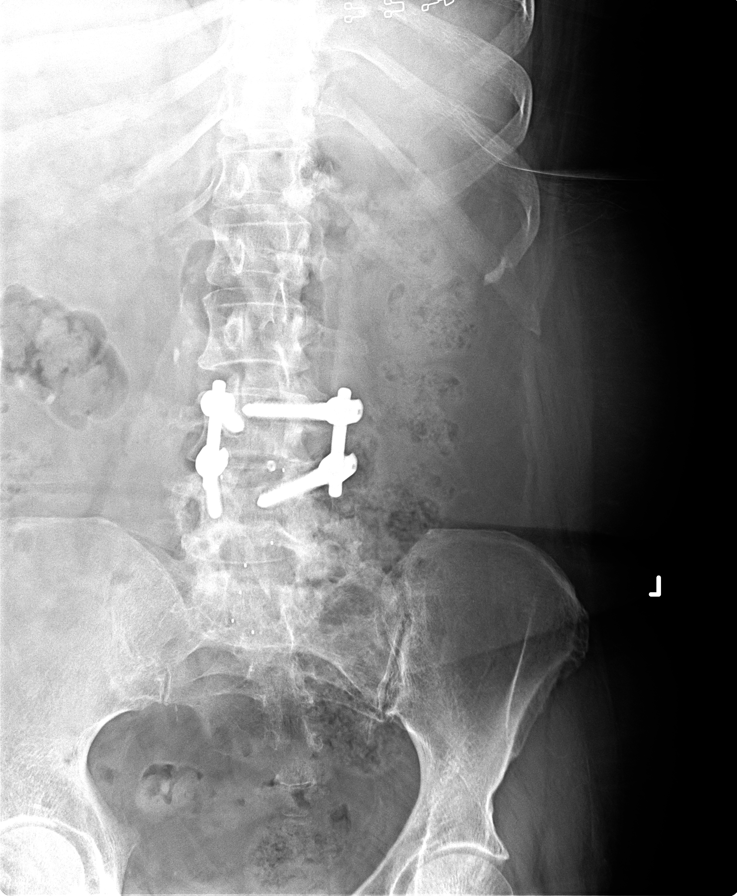

[8 of 9 positions shown; findings below may reference images not displayed]

FINDINGS: Interbody fusion spacers are noted at L3-4, L4-5, and L5-
S1.  Pedicle screws and posterior interconnecting rods at L3-4.
The hardware appears to be intact.  Posterolateral bony fusion from
L5-S1.  Periscrew lucencies at L3 and possibly at L4 as well.  The
facet joints unremarkable.
IMPRESSION: No acute L-spine findings.  Postoperative findings described.  Bony
lucencies adjacent to the L3 and L4 pedicle screws.  This may
reflect screw loosening.  The clinical significance of this finding
is uncertain.

## 2010-07-08 NOTE — Assessment & Plan Note (Signed)
Summary: uti/alc   Vital Signs:  Patient profile:   60 year old female Weight:      201.50 pounds Temp:     97.4 degrees F oral Pulse rate:   88 / minute Pulse rhythm:   regular BP sitting:   118 / 70  (left arm) Cuff size:   large  Vitals Entered By: Maudie Mercury Dance CMA Deborra Medina) (May 25, 2010 8:17 AM) CC: ? UTI   History of Present Illness: CC: UTI?  1 wk h/o dysuria at end of void and suprapubic pressure.  + urgency and polyuria.  last UTI was several years ago.  No fevers/chills, other abd pain, back pain, n/v.  No recent abx use.  smokes 1 ppd.  + some diarrhea since on colace.  Current Medications (verified): 1)  Vivelle-Dot 0.1 Mg/24hr Pttw (Estradiol) .... Twice A Week 2)  Lamictal 100 Mg  Tabs (Lamotrigine) .... Take 3 Tablest By Mouth Once A Day 3)  Seroquel 300 Mg Tabs (Quetiapine Fumarate) .... Take One Tablet By Mouth Three Times Daily 4)  Ambien 10 Mg  Tabs (Zolpidem Tartrate) .... Take 1 Tab By Mouth At Bedtime 5)  Abilify 30 Mg Tabs (Aripiprazole) .... One Tablet Daily 6)  Omeprazole 40 Mg Cpdr (Omeprazole) .... Take 1 Tablet By Mouth Once A Day 7)  Docusate Sodium 100 Mg Caps (Docusate Sodium) .... Take 1 Capsule By Mouth Two Times A Day As Needed 8)  Klonopin 1 Mg Tabs (Clonazepam) .... Take One To Two Tablets By Mouth Daily 9)  Cyclobenzaprine Hcl 10 Mg  Tabs (Cyclobenzaprine Hcl) .Marland Kitchen.. 1 By Mouth 2 Times Daily As Needed For Back Pain 10)  Nabumetone 750 Mg Tabs (Nabumetone) .... One Tablet By Mouth Two Times A Day 11)  Promethazine Hcl 25 Mg  Tabs (Promethazine Hcl) .... 1/2-1 By Mouth Every 8 Hours As Needed For Nausea  Allergies (verified): No Known Drug Allergies  Past History:  Past Medical History: Last updated: 03/18/2010 COLONIC POLYPS, ADENOMATOUS (ICD-211.3) HEALTH SCREENING (ICD-V70.0) INSOMNIA (ICD-780.52) RESTLESS LEGS SYNDROME (ICD-333.94) POLYP, COLON (ICD-211.3) INCONTINENCE (ICD-788.30) HEPATITIS B, CHRONIC (ICD-070.32) Hx of  RHEUMATIC FEVER (ICD-390) HYPERTENSION (ICD-401.9) HYPERLIPIDEMIA (ICD-272.4) COPD (ICD-496) DISORDER, NERVOUS SYSTEM NOS (ICD-349.9) BIPOLAR AFFECTIVE DISORDER, HX OF (ICD-V11.8) OBSTRUCTIVE SLEEP APNEA (ICD-327.23) CEREBROVASCULAR ACCIDENT, HX OF (ICD-V12.50)    Social History: Last updated: 10/23/2008 Marital Status: divorced Children: 1--son 109 yrs old Occupation: disability due to bipolar Patient currently smokes. 1/2 pack daily Alcohol Use - no Daily Caffeine Use: 2 cups daily Illicit Drug Use - no Patient does not get regular exercise.   Review of Systems       per HPI  Physical Exam  General:  alert, well-developed, well-nourished, and well-hydrated.    Mouth:  slightly dry mm.  + dentures upper. Abdomen:  suparpubic tenderness.  + mild L CVA tenderness.  + some LUQ tenderness to deep palpation.  + slight distension throughout.  NABS, soft, no rebound/guarding. Pulses:  2+ rad pulses Extremities:  no pedal edema   Impression & Recommendations:  Problem # 1:  DYSURIA (ICD-788.1)  UA possibly contaminated.  sent UCx.  treat with Cipro x 7 days given going on for 1 wk.  red flags to return discussed.  Orders: UA Dipstick W/ Micro (manual) (81000) Specimen Handling (99000) T-Culture, Urine ([id number])  Her updated medication list for this problem includes:    Cipro 500 Mg Tabs (Ciprofloxacin hcl) .Marland Kitchen... Take one by mouth two times a day x 7 days  Complete Medication List:  1)  Vivelle-dot 0.1 Mg/24hr Pttw (Estradiol) .... Twice a week 2)  Lamictal 100 Mg Tabs (Lamotrigine) .... Take 3 tablest by mouth once a day 3)  Seroquel 300 Mg Tabs (Quetiapine fumarate) .... Take one tablet by mouth three times daily 4)  Ambien 10 Mg Tabs (Zolpidem tartrate) .... Take 1 tab by mouth at bedtime 5)  Abilify 30 Mg Tabs (Aripiprazole) .... One tablet daily 6)  Omeprazole 40 Mg Cpdr (Omeprazole) .... Take 1 tablet by mouth once a day 7)  Docusate Sodium 100 Mg Caps  (Docusate sodium) .... Take 1 capsule by mouth two times a day as needed 8)  Klonopin 1 Mg Tabs (Clonazepam) .... Take one to two tablets by mouth daily 9)  Cyclobenzaprine Hcl 10 Mg Tabs (Cyclobenzaprine hcl) .Marland Kitchen.. 1 by mouth 2 times daily as needed for back pain 10)  Nabumetone 750 Mg Tabs (Nabumetone) .... One tablet by mouth two times a day 11)  Promethazine Hcl 25 Mg Tabs (Promethazine hcl) .... 1/2-1 by mouth every 8 hours as needed for nausea 12)  Cipro 500 Mg Tabs (Ciprofloxacin hcl) .... Take one by mouth two times a day x 7 days  Patient Instructions: 1)  Looks like possible UTI.  Treat with cipro 572m twice daily for 7 days. 2)  Let uKoreakonw if not better in next 3-5 days.  Push fluids (water and cranberry juice) and plenty of rest. 3)  Reasons you may need to return - any fevers, worsening back pain, abdominal pain or nausea/vomiting, or not improving as expected. Prescriptions: CIPRO 500 MG TABS (CIPROFLOXACIN HCL) take one by mouth two times a day x 7 days  #14 x 0   Entered and Authorized by:   JRia Bush MD   Signed by:   JRia Bush MD on 05/25/2010   Method used:   [facility]        [facility]  [address] [facility]       [address]  [address]      Ph: [phone] [phone]      Fax: [phone]  RxID:   [id number]   Orders Added: 1)  Est. Patient Level III [[85462]2)  UA Dipstick W/ Micro (manual) [81000] 3)  Specimen Handling [99000] 4)  T-Culture, Urine [[[id number]]   Current Allergies (reviewed today): No known allergies   Laboratory Results   Urine Tests  Date/Time Received: May 25, 2010 8:29 AM  Date/Time Reported: May 25, 2010 8:29 AM   Routine Urinalysis   Color: yellow Appearance: Hazy Glucose: negative   (Normal Range: Negative) Bilirubin: negative   (Normal Range: Negative) Ketone: negative   (Normal Range: Negative) Spec. Gravity: >=1.030   (Normal Range:  1.003-1.035) Blood: negative   (Normal Range: Negative) pH: 6.0   (Normal Range: 5.0-8.0) Protein: trace   (Normal Range: Negative) Urobilinogen: 0.2   (Normal Range: 0-1) Nitrite: positive   (Normal Range: Negative) Leukocyte Esterace: small   (Normal Range: Negative)  Urine Microscopic WBC/HPF: TNTC RBC/HPF: 5-10 Bacteria/HPF: 3+ Mucous/HPF: no Epithelial/HPF: 5-10 Crystals/HPF: no Casts/LPF: no Yeast/HPF: no    Comments: read by ...................JRia Bush MD  May 25, 2010 9:12 AM UCx sent.

## 2010-07-08 NOTE — Progress Notes (Signed)
Summary: REQUIP  Phone Note Refill Request Message from:  [facility] mail order pharmacy on June 08, 2010 12:29 PM  faxed request for Requip 35m, not on active med list. please advise. Form on your desk    [place] by: DEdwin DadaCMA (Deborra Medina,  June 08, 2010 12:30 PM  Follow-up for Phone Call        please call pt to find out who is prescribing it and why we no longer have on her list. TArnette NorrisMD  June 08, 2010 12:34 PM   left message on machine at home for patient to return my call.  DeShannon Smith CMA (Deborra Medina  June 08, 2010 2:24 PM   Patient says that Dr. SHalford Chessmanhad been filing it, but he told her that he didn't need to see her anymore and that she will need her PCP to fill it from now on.  ALacretia Nicks June 09, 2010 8:56 AM      New/Updated Medications: REQUIP 5 MG TABS (ROPINIROLE HCL) 1 tab by mouth daily. Prescriptions: REQUIP 5 MG TABS (ROPINIROLE HCL) 1 tab by mouth daily.  #90 x 3   Entered by:   LMarty HeckCMA, AAMA   Authorized by:   TArnette NorrisMD   Signed by:   LMarty HeckCMA, AAMA on 06/09/2010   Method used:   Faxed to ...       [id number]       [place]  [address]      Ph: [phone]      Fax: [phone]  RxID:   [id number]

## 2010-07-08 NOTE — Assessment & Plan Note (Signed)
Summary: rib hurtls/alc   Vital Signs:  Patient profile:   60 year old female Height:      63 inches Weight:      200.50 pounds BMI:     35.65 Temp:     97.8 degrees F oral Pulse rate:   80 / minute Pulse rhythm:   regular BP sitting:   112 / 72  (left arm) Cuff size:   large  Vitals Entered By: Sherrian Divers CMA Deborra Medina) (June 29, 2010 8:17 AM) CC: hip pain, rash under breast   History of Present Illness: 60 yo here for:  1.  Right rib pain- fell last friday getting out her car at her orthopedist's office.  [address] [address] on her left side and felt fine until a couple of days ago, right rib became very painful to touch.  Also hurts to cough and take deep breaths.  No SOB.  2.  Rash under breasts- started over a week ago, putting neosporin on it, not better.  Rash is tender, does not itch.    Current Medications (verified): 1)  Vivelle-Dot 0.1 Mg/24hr Pttw (Estradiol) .... Twice A Week 2)  Lamictal 100 Mg  Tabs (Lamotrigine) .... Take 3 Tablest By Mouth Once A Day 3)  Seroquel 300 Mg Tabs (Quetiapine Fumarate) .... Take One Tablet By Mouth Three Times Daily 4)  Ambien 10 Mg  Tabs (Zolpidem Tartrate) .... Take 1 Tab By Mouth At Bedtime 5)  Abilify 30 Mg Tabs (Aripiprazole) .... One Tablet Daily 6)  Omeprazole 40 Mg Cpdr (Omeprazole) .... Take 1 Tablet By Mouth Once A Day 7)  Docusate Sodium 100 Mg Caps (Docusate Sodium) .... Take 1 Capsule By Mouth Two Times A Day As Needed 8)  Klonopin 1 Mg Tabs (Clonazepam) .... Take One To Two Tablets By Mouth Daily 9)  Cyclobenzaprine Hcl 10 Mg  Tabs (Cyclobenzaprine Hcl) .Marland Kitchen.. 1 By Mouth 2 Times Daily As Needed For Back Pain 10)  Nabumetone 750 Mg Tabs (Nabumetone) .... One Tablet By Mouth Two Times A Day 11)  Promethazine Hcl 25 Mg  Tabs (Promethazine Hcl) .... 1/2-1 By Mouth Every 8 Hours As Needed For Nausea 12)  Requip 5 Mg Tabs (Ropinirole Hcl) .Marland Kitchen.. 1 Tab By Mouth Daily. 13)  Hydrocodone-Acetaminophen 10-325 Mg Tabs (Hydrocodone-Acetaminophen)  .... Take One Tablet By Mouth Every 6-8 Hours As Needed Pain 14)  Nystatin 100000 Unit/gm Powd (Nystatin) .... Apply To Area Twice Daily As Needed  Allergies (verified): No Known Drug Allergies  Past History:  Past Medical History: Last updated: 03/18/2010 COLONIC POLYPS, ADENOMATOUS (ICD-211.3) HEALTH SCREENING (ICD-V70.0) INSOMNIA (ICD-780.52) RESTLESS LEGS SYNDROME (ICD-333.94) POLYP, COLON (ICD-211.3) INCONTINENCE (ICD-788.30) HEPATITIS B, CHRONIC (ICD-070.32) Hx of RHEUMATIC FEVER (ICD-390) HYPERTENSION (ICD-401.9) HYPERLIPIDEMIA (ICD-272.4) COPD (ICD-496) DISORDER, NERVOUS SYSTEM NOS (ICD-349.9) BIPOLAR AFFECTIVE DISORDER, HX OF (ICD-V11.8) OBSTRUCTIVE SLEEP APNEA (ICD-327.23) CEREBROVASCULAR ACCIDENT, HX OF (ICD-V12.50)    Past Surgical History: Last updated: 03/18/2010 Back surgery Cholecystectomy Rotator Cuff Repair Total Abdominal Hysterectomy fusion in lumbar area 10/09 CT abd 04/09/08--fatty liver, no other findings Hemorrhoidectomy and colon polyp removed  Family History: Last updated: 10/23/2008 No FH of Colon Cancer: Family History of Breast Cancer:Mother Lung Cancer:Mother Family History of Stomach Cancer:Father Family History of Diabetes: 4 Aunts and a Uncle  Social History: Last updated: 10/23/2008 Marital Status: divorced Children: 1--son 32 yrs old Occupation: disability due to bipolar Patient currently smokes. 1/2 pack daily Alcohol Use - no Daily Caffeine Use: 2 cups daily Illicit Drug Use - no Patient does not get regular  exercise.   Risk Factors: Caffeine Use: 3 (06/19/2008) Exercise: no (10/23/2008)  Risk Factors: Smoking Status: current (03/19/2009) Packs/Day: 1ppd (03/19/2008) Passive Smoke Exposure: yes (06/19/2008)  Review of Systems      See HPI General:  Denies fever. Derm:  Complains of rash.  Physical Exam  General:  alert, well-developed, well-nourished, and well-hydrated.    Chest Wall:  right chest wall  TTP Lungs:  normal respiratory effort, no intercostal retractions, and no crackles.   Heart:  Normal rate and regular rhythm. S1 and S2 normal without gallop, murmur, click, rub or other extra sounds. Skin:  erythematous confluent rash with satelittle lesions on breasts bilaterally. Psych:  Cognition and judgment appear intact. Alert and cooperative with normal attention span and concentration. No apparent delusions, illusions, hallucinations   Impression & Recommendations:  Problem # 1:  INTERTRIGO, CANDIDAL (ICD-695.89) Assessment New  Advised to keep area dry. Nystatin powder to area until rash clears.  Orders: Prescription Created Electronically [id number])  Problem # 2:  RIB PAIN, RIGHT SIDED (ICD-786.50) Assessment: New Explained to pt that since lung exam is normal, not necessary to perform xray to verify rib fractures.   Treatment is supportive. Pt takes narcotics for chronic pain which does help with her rib pain.  Complete Medication List: 1)  Vivelle-dot 0.1 Mg/24hr Pttw (Estradiol) .... Twice a week 2)  Lamictal 100 Mg Tabs (Lamotrigine) .... Take 3 tablest by mouth once a day 3)  Seroquel 300 Mg Tabs (Quetiapine fumarate) .... Take one tablet by mouth three times daily 4)  Ambien 10 Mg Tabs (Zolpidem tartrate) .... Take 1 tab by mouth at bedtime 5)  Abilify 30 Mg Tabs (Aripiprazole) .... One tablet daily 6)  Omeprazole 40 Mg Cpdr (Omeprazole) .... Take 1 tablet by mouth once a day 7)  Docusate Sodium 100 Mg Caps (Docusate sodium) .... Take 1 capsule by mouth two times a day as needed 8)  Klonopin 1 Mg Tabs (Clonazepam) .... Take one to two tablets by mouth daily 9)  Cyclobenzaprine Hcl 10 Mg Tabs (Cyclobenzaprine hcl) .Marland Kitchen.. 1 by mouth 2 times daily as needed for back pain 10)  Nabumetone 750 Mg Tabs (Nabumetone) .... One tablet by mouth two times a day 11)  Promethazine Hcl 25 Mg Tabs (Promethazine hcl) .... 1/2-1 by mouth every 8 hours as needed for nausea 12)  Requip 5  Mg Tabs (Ropinirole hcl) .Marland Kitchen.. 1 tab by mouth daily. 13)  Hydrocodone-acetaminophen 10-325 Mg Tabs (Hydrocodone-acetaminophen) .... Take one tablet by mouth every 6-8 hours as needed pain 14)  Nystatin 100000 Unit/gm Powd (Nystatin) .... Apply to area twice daily as needed Prescriptions: NYSTATIN 100000 UNIT/GM POWD (NYSTATIN) apply to area twice daily as needed  #30g x 0   Entered and Authorized by:   [name] MD   Signed by:   [name] MD on 06/29/2010   Method used:   [facility]        [facility]  [address] [place] [facility]       [address]       [place], [place]  [address]       Ph: [phone] or [phone]       Fax: [phone]   RxID:   [id number]    Orders Added: 1)  Prescription Created Electronically [D5686] 2)  Est. Patient Level IV [16837]    Current Allergies (reviewed today): No known allergies

## 2010-08-19 ENCOUNTER — Other Ambulatory Visit: Payer: Self-pay | Admitting: Thoracic Surgery

## 2010-08-19 DIAGNOSIS — R911 Solitary pulmonary nodule: Secondary | ICD-10-CM

## 2010-08-19 LAB — POCT I-STAT, CHEM 8
Creatinine, Ser: 0.9 mg/dL (ref 0.4–1.2)
Glucose, Bld: 111 mg/dL — ABNORMAL HIGH (ref 70–99)
HCT: 39 % (ref 36.0–46.0)
Hemoglobin: 13.3 g/dL (ref 12.0–15.0)
Sodium: 140 mEq/L (ref 135–145)
TCO2: 31 mmol/L (ref 0–100)

## 2010-09-06 ENCOUNTER — Other Ambulatory Visit: Payer: Self-pay | Admitting: *Deleted

## 2010-09-06 MED ORDER — CYCLOBENZAPRINE HCL 10 MG PO TABS: ORAL_TABLET | ORAL | Status: DC

## 2010-09-22 ENCOUNTER — Ambulatory Visit
Admission: RE | Admit: 2010-09-22 | Discharge: 2010-09-22 | Disposition: A | Discharge status: Home / Self Care | Payer: Medicare Other | Source: Ambulatory Visit | Attending: Thoracic Surgery | Admitting: Thoracic Surgery

## 2010-09-22 ENCOUNTER — Ambulatory Visit (INDEPENDENT_AMBULATORY_CARE_PROVIDER_SITE_OTHER): Payer: Medicare Other | Admitting: Thoracic Surgery

## 2010-09-22 DIAGNOSIS — D381 Neoplasm of uncertain behavior of trachea, bronchus and lung: Secondary | ICD-10-CM

## 2010-09-22 DIAGNOSIS — R911 Solitary pulmonary nodule: Secondary | ICD-10-CM

## 2010-09-23 NOTE — Assessment & Plan Note (Signed)
OFFICE VISIT  Stephanie [name], Stephanie [name] DOB:  03-07-1951                                        September 22, 2010 CHART #:  [id number]  The patient returns today.  Her blood pressure is 124/76, pulse 99, respirations 18, and saturations were 95%.  Her CT scan showed no change in her 5 mm nodules, now 6 months since her previous evaluation.  We will see her again in 9 months with another CT scan and if that is stable, then we will probably refer her back to her medical doctor.  Nicanor Alcon, M.D. Electronically Signed  DPB/MEDQ  D:  09/22/2010  T:  09/23/2010  Job:  [id number]

## 2010-10-19 NOTE — Letter (Signed)
April 13, 2010   Stephanie [name]. Alvan Dame, MD  [facility]  [address]  [place]  [place], [place] [address]   Re:  Stephanie, [name]                DOB:  June 20, 1950   Dear [name]:   I appreciate the opportunity of seeing the patient.  This 60 year old  obese Caucasian female was having trouble apparently with her left hip  and apparently had a cough.  There was a bone scan showed a lytic lesion  in her right hip and she has left hip pain.  CT scan was done that  showed 5-mm nodules in the right upper lobe that was very indiscrete.  She smokes one pack a day.  Does not drink alcohol on a regular basis.  She has had no hemoptysis, fever, chills, or excessive sputum.   FAMILY HISTORY:  Negative for cancer.   MEDICATIONS:  1. Vivelle 0.1 twice a week.  2. ReQuip 5 mg daily.  3. Lamictal 100 mg.  4. Seroquel 300 mg 1 tablet 3 times daily.  5. Ambien 10 mg daily.  6. Abilify 30 mg 1 daily.  7. Zofran 1 every 6 hours.  8. Omeprazole 40 mg daily.  9. Colace.  10.Klonopin 1 mg daily.  11.Cyclobenzaprine 1 mg daily.  12.Mirtazapine 7.5 mg at bedtime.  13.Nabumetone 750 mg twice a day.   SOCIAL HISTORY:  She is on disability.  She is single.  Smokes a pack a  day.   REVIEW OF SYSTEMS:  VITAL SIGNS:  She is 5 feet 3 inches, 196 pounds.  GENERAL:  She has had weight gain.  CARDIAC:  She has shortness of breath with exertion.  PULMONARY:  She has wheezing.  GI:  Reflux.  GU:  No kidney disease, dysuria, or frequent urination.  VASCULAR:  No claudication, DVT, or TIAs.  NEUROLOGICAL:  No dizziness, headaches, blackouts, or seizures.  MUSCULOSKELETAL:  She has arthritis, joint pain, and muscle pain.  PSYCHIATRIC:  Depression and nervousness.  EYE/ENT:  Some recent decrease in her eyesight.  No changes in her  hearing.  HEMATOLOGICAL:  No problems with bleeding, clotting disorders, or  anemia.   PHYSICAL EXAMINATION:  General:  She is an obese Caucasian female, in no  acute  distress.  Head, Eyes, Ears, Nose, and Throat:  Unremarkable.  Neck:  Supple without thyromegaly.  There is no supraclavicular or  axillary adenopathy.  Chest:  Clear to auscultation and percussion.  Heart:  Regular, sinus rhythm.  No murmurs.  Abdomen:  Obese.  Bowel  sounds are normal.  Extremities:  Pulses are 1+.  There is 1+ edema.  No  clubbing.  Neurological:  She is oriented x3.  Sensory and motor grossly  intact.   I examined the CT scan.  As I said, these 5-mm nodules were nonfocal.  I  do not know what is causing this lytic lesion in the right hip, but it  does not appear to be from any type of a possible lung cancer even  though she is a smoker.  I would recommend that we follow up on this  nodule in 4 months, and we will get a CT scan at that time.  If there is  any change, then will be more aggressive workup.  We need that if it  would be at least 10 mm before PET scan would be helpful.  I appreciate  the opportunity of seeing the patient.  Sincerely,   Nicanor Alcon, M.D.  Electronically Signed   DPB/MEDQ  D:  04/13/2010  T:  04/14/2010  Job:  [id number]

## 2010-10-19 NOTE — Assessment & Plan Note (Signed)
[place]                             PULMONARY OFFICE NOTE   NAME:ROBBINSGredmarie, Delange                       MRN:          [id number]  DATE:01/04/2007                            DOB:          1951/03/27    I saw Ms. Hewett today in follow up for her restless legs syndrome,  sleep apnea, and chronic obstructive pulmonary disease.   Since her last visit with me, she has undergone an overnight  polysomnogram. This was done on November 30, 2006. She followed a split  night study protocol. During the diagnostic portion of the study, she  was found to have severe obstructive sleep apnea with an apnea hypopnea  index of 33.4 and an oxygen saturation nadir of 85%. During the  therapeutic portion of the test, she was titrated to a CPAP pressure  setting of 12 centimeters of water with a reduction in her apnea  hypopnea index to 12, but she still had an elevation in her apnea  hypopnea index to 12 with several central apneic events in addition a  few obstructive events.   Since that time, she has been started on a CPAP of 12 centimeters of  water, with a full face mask and heated humidification. She says that  she will go to sleep at about 9 o'clock, it takes her an hour to fall  asleep, and once she falls asleep, she will wake up after two hours  feeling like she cannot catch her breath and she has this recur through  the night until she gets up at 8 in the morning. She says that she has  not really noticed a difference as far as her sleep quality at night or  how she feels during the day. Of note is that she says that her leg  symptoms have improved considerably since being started on CPAP.   CURRENT MEDICATIONS:  1. Calcium 600 mg daily.  2. Vesicare 5 mg daily.  3. Requip 4 mg nightly.  4. Lamictal 300 mg daily.  5. Abilify 30 mg daily.  6. Iron 65 mg daily.  7. Fluoxetine 30 mg daily.  8. Vivildot 0.1 mg 2 tablets per week.  9. Spiriva 1 puff daily.  10.Hydrochlorothiazide 25 mg daily.  11.Proventil 2 puffs daily.  12.Motrin 200 mg as needed.   PHYSICAL EXAMINATION:  VITAL SIGNS:  Weight 183 pounds, temperature 98,  blood pressure 120/76, heart rate 87, oxygen saturation 96% on room air.  HEENT:  No sinus tenderness, no nasal discharge, no oral lesions, no  lymphadenopathy.  HEART:  S1, S2.  CHEST:  There was no wheezing or rales.  ABDOMEN:  Obese, soft, nontender.  EXTREMITIES:  No edema.   IMPRESSION:  1. Severe obstructive sleep apnea. She is currently on CPAP at 12      centimeters of water. She still had some respiratory events even at      this pressure setting, and she is also still having difficulties      with sleep disruption and daytime sleepiness. I will arrange for      her to undergo  an auto-CPAP titration study for two weeks to      determine if she would need to have any further adjustments to her      pressure settings. She is also due to have her mask refitted. If      she is having difficulties after this, then we may need to consider      having her undergo a BiPAP titration study.  2. Restless legs syndrome. This has improved considerably since her      last visit. I am not sure if this is related to improvement with      the use of her CPAP or what I suspect is that since she has had      several of her psychiatric medications changed, this may have      improved her symptoms. Specifically since her last visit, she had      her Mirtazapine discontinued. For the time being, I will continue      her on Requip at 4 mg daily and then once she is more established      on her CPAP machine, I will see if we can decrease the dose of her      Requip.  3. Chronic obstructive pulmonary disease. She appears to reasonably      stable on her current inhaler regimen of Spiriva and Proventil.   I will follow up with her in four months, but I will call her with the  results of her auto-CPAP titration.     Chesley Mires,  MD  Electronically Signed    VS/MedQ  DD: 01/04/2007  DT: 01/05/2007  Job #: [id number]   cc:   Collene Gobble, MD  Venia Carbon, MD  Flint Melter Jacolyn Reedy, M.D.  Rennis Harding, M.D.

## 2010-10-19 NOTE — Procedures (Signed)
Stephanie [name], Stephanie [name]              ACCOUNT NO.:  [id number]   MEDICAL RECORD NO.:  [id number]          PATIENT TYPE:  OUT   LOCATION:  SLEEP CENTER                 FACILITY:  [facility]   PHYSICIAN:  Chesley Mires, MD        DATE OF BIRTH:  1951-06-03   DATE OF STUDY:  02/01/2009                            NOCTURNAL POLYSOMNOGRAM   REFERRING PHYSICIAN:  Chesley Mires, MD   REFERRING PHYSICIAN:  Chesley Mires, MD   INDICATION FOR STUDY:  Ms. Hipps is a 60 year old female who has a  history of hypertension and cerebrovascular accident.  She also has a  prior history of obstructive sleep apnea.  She was previously on CPAP  therapy but stopped her CPAP therapy as she no longer believed that she  had sleep apnea.  She is therefore referred to the sleep lab to further  evaluate whether she continues to have obstructive sleep apnea.  Height  is 5 feet 3 inches, weight is 194 pounds.  BMI is 34.  Neck size is 16.5  inches.   EPWORTH SLEEPINESS SCORE:  Seven.   MEDICATIONS:  Vivelle, Celebrex, ReQuip, Lamictal, Seroquel, Zofran,  Abilify, morphine, and ProAir.  The patient took cerebral Ambien,  morphine, and Zofran on a study.   SLEEP ARCHITECTURE:  The patient followed a split-night study protocol.  During the diagnostic portion of the study, total recording time was 130  minutes.  Total sleep time was 126 minutes.  Sleep efficiency was 97%.  Sleep latency was 4 minutes.  REM latency was 119 minutes.  This portion  study was normal for lack of slow-wave sleep and a reduction in the  percentage of REM sleep.  The patient slept in both the supine and  nonsupine positions.  During the titration portion of the study, total  recording time was 317 minutes.  Total sleep time was 309 minutes.  Sleep efficiency was 97%.  Sleep latency was 1 minute.  REM latency was  1 minute.  This portion of the study was notable for lack of slow-wave  sleep and an increase in the percentage of REM sleep to 30% of  this  portion of study.  The patient slept exclusively in the supine position.   RESPIRATORY DATA:  The average respiratory rate was 16.  Moderate  snoring was noted by the technician.  During the diagnostic portion of  the study, the overall apnea-hypopnea index was 21.  There were 10  central apneic events and 3 mixed apneic events.  The remainder of  events were obstructive in nature.  The REM apnea-hypopnea index was 56.  The non-REM apnea-hypopnea index was 19.  The supine apnea-hypopnea  index was 28.6.  The nonsupine apnea-hypopnea index was 4.4.  During the  titration portion of the study, the patient was titrated from a CPAP  pressure setting of 5-11 cm of water.  At a CPAP pressure setting of 11  cm of water, the apnea-hypopnea index was reduced to zero, and the  patient was observed in REM sleep and supine sleep.   OXYGEN DATA:  The baseline oxygenation was 94%.  The oxygen saturation  nadir was 84%.  At a CPAP pressure setting of 11 cm of water, the  patient was able to maintain her oxygenation.   CARDIAC DATA:  The average heart rate was 77 and the rhythm strip showed  normal sinus rhythm.   MOVEMENT-PARASOMNIA:  The periodic limb movement index was zero and the  patient had one restroom trip.   IMPRESSIONS-RECOMMENDATIONS:  This study shows evidence for moderate-to-  severe obstructive sleep apnea.   In addition to diet, excise, weight reduction, the patient should resume  CPAP therapy starting at a CPAP pressure setting of 11 cm of water.      Chesley Mires, MD  Diplomat, American Board of Sleep Medicine  Electronically Signed     VS/MEDQ  D:  02/02/2009 09:37:07  T:  02/03/2009 00:04:14  Job:  [id number]

## 2010-10-19 NOTE — Assessment & Plan Note (Signed)
[facility]                             PULMONARY OFFICE NOTE   NAME:ROBBINSTyrene, Nader                       MRN:          [id number]  DATE:10/16/2006                            DOB:          12/17/1950    SUBJECTIVE:  Ms. Oros is a 60 year old woman with a history of  tobacco abuse and obstructive sleep apnea.  She has also had significant  difficulty with bilateral lower extremity pain that we have ascribed to  restless leg syndrome.  She has been tried on Requip that has been  titrated up to 4 mg q.h.s.  I have also given her iron supplementation,  but she continues to have lower extremity discomfort.  She tolerates her  CPAP well, but feels poorly rested during the day, usually she is up and  down out of bed due to leg pain several times a day.  Her leg discomfort  gets better when she gets up to walk.  Since our last visit, she has  undergone back surgery for degenerative disk disease by Rennis Harding, M.D.  with neurosurgery.  She has had some radicular pain from her thighs to  her knees.  It is not clear to me whether her nocturnal calf pain is  related to her radiculopathy or to her restless leg syndrome.   CURRENT MEDICATIONS:  1. Abilify 20 mg daily.  2. Trazodone 300 mg q.h.s.  3. Lamictal 150 mg t.i.d.  4. Vivelle.  5. Spiriva one inhalation daily.  6. Mirtazapine 30 mg q.h.s.  7. Requip 4 mg q.h.s.  8. Vesicare.  9. Senna one every other day.  10.Ibuprofen 200 mg p.r.n.  11.Lorazepam p.r.n.  12.Albuterol two puffs q.4 hours p.r.n.  13.Hydrocodone 5/500 q.4 hours p.r.n.  14.Methocarbamol 500 mg q.6 hours p.r.n.  15.CPAP at 11 cm of water.   PHYSICAL EXAMINATION:  GENERAL:  This is a comfortable, overweight woman  who is sitting up in a chair.  She is wearing a back brace.  LUNGS:  Clear to auscultation bilaterally.  HEART:  Regular without murmur.  ABDOMEN:  Obese, soft, and nontender with positive bowel sounds.  EXTREMITIES:  No  cyanosis, clubbing, or edema.   IMPRESSION:  1. Obstructive sleep apnea that appears to be stable on CPAP.  2. Lower extremity pain worse at night with sleep disruption.  I have      ascribed this to restless leg syndrome, but now in the setting of      her recent back surgery, I wonder whether this is more neuropathic      type lower extremity pain.  It is continuing to disrupt her sleep      and she is no better on Requip and after iron supplementation.  I      would like to send her to see Chesley Mires, M.D. in our practice to      see if there are other options for [name] to explore with regard to      better treatment of possible restless leg syndrome.  She will then      follow up with  me for continued care.     Collene Gobble, MD  Electronically Signed    RSB/MedQ  DD: 10/16/2006  DT: 10/16/2006  Job #: [id number]   cc:   Chesley Mires, MD  Rennis Harding, M.D.  Venia Carbon, MD

## 2010-10-19 NOTE — Letter (Signed)
April 30, 2007    [name] D. [address], [place], [place] [address]   RE:  [name], [name]  MRN:  [id number]  /  DOB:  27-Dec-1950   Dear [name] Bean:   Upon your kind referral, I had the pleasure of evaluating your patient  and I am pleased to offer my findings.  I saw Stephanie [name] in the  office today.  Enclosed is a copy of my progress note that details my  findings and recommendations.   Thank you for the opportunity to participate in your patient's care.    Sincerely,      Sandy Salaam. Deatra Ina, MD,FACG  Electronically Signed    RDK/MedQ  DD: 04/30/2007  DT: 04/30/2007  Job #: [id number]

## 2010-10-19 NOTE — Assessment & Plan Note (Signed)
[facility]                             PULMONARY OFFICE NOTE   NAME:ROBBINSDelisha, Peaden                       MRN:          [id number]  DATE:03/02/2007                            DOB:          [birth year]    DATE OF BIRTH:  [birth year].   NOTATION:  I had received the CPAP download for [name].  It  appeared that she did quite well at a pressure setting of 11 and she had  very good usage of her machine with a minimal amount of leak.   What I will do is to arrange for her to have a pressure decrease to 11  and see if she is able to  tolerate this better.  If she is still having  difficulties after this, she may need to be returned to the Sleep  Laboratory for an in-lab titration study.     Chesley Mires, MD  Electronically Signed    VS/MedQ  DD: 03/04/2007  DT: 03/04/2007  Job #: [id number]

## 2010-10-19 NOTE — Assessment & Plan Note (Signed)
[place]                             PULMONARY OFFICE NOTE   NAME:ROBBINSJessicah, Croll                       MRN:          [id number]  DATE:11/02/2006                            DOB:          [birth year]    I met Ms. Meske today for evaluation of her sleep difficulties.   She was diagnosed with obstructive sleep apnea June 04, 2005 when  she had undergone an overnight polysomnogram, and was found to have a  respiratory disturbance index of 20 with an oxygen saturation of 87%.  She then had a CPAP titration study done on June 27, 2005, and was  titrated to a CPAP pressure setting of 11 with a reduction in her apnea  hypopnea index to zero.  She was observed in supine sleep, but not REM  sleep at this pressure setting.  Upon review of her sleep study, it  appeared that she did not really have any respiratory events, even at  lower pressure settings on CPAP, but that she had her pressure increased  due to breakthrough episodes of snoring.  She was also noted to have an  increase in her periodic limb movement index to 30.   She has been having difficulties with pain in her legs, which she  describes as an ache, pounding, and burning, which goes from her knees  to her ankles in both of her legs.  She says that when she walks this  helps, and when she lies still, it gets worse.  She denies any actual  muscle cramps.  She says the symptoms usually start around 9 o'clock at  night.  She has been tried on iron supplementation, as well as Requip.  She has had her Requip increased to as high as 4 mg a day without any  real significant benefit.  She was also recently evaluated by a  neurologist, Dr. Marney Setting, for possible neuropathy, and per the  patient, this evaluation was unremarkable, and she was again started on  iron supplementation.  Her current sleep pattern is that she goes to be  at around 9 o'clock at night.  It takes her about 45  minutes to fall  asleep.  She will sleep for about 2 to 3 hours, and then wake up.  She  says that she is awake several times during the night.  She gets up at  around 10 o'clock in the morning.  She still feels tired.  She does not  think that she snores anymore while she is asleep.  She is currently  using a full face mask with humidification per her CPAP machine.  She  did have a disk fusion done by Dr. Patrice Paradise on February 14th.  She is not  currently using anything to help her fall asleep at night, or stay awake  during the day.  There is no history of sleep hallucination, sleep  paralysis, or cataplexy.   PAST MEDICAL HISTORY:  Significant for:  1. Bipolar disorder.  2. Incontinence.  3. Rheumatic fever.  4. Chronic hepatitis B.  5. Hyperlipidemia.  6. Obstructive sleep apnea.  7. COPD.   PAST SURGICAL HISTORY:  Significant for:  1. Cholecystectomy.  2. Spinal fusion.  3. Hysterectomy.  4. Appendectomy.  5. Rotator cuff.  6. Exploratory laparotomy.   CURRENT MEDICATIONS:  1. Calcium 600 mg daily.  2. Senna Plus.  3. Vesicare 5 mg daily.  4. Requip 4 mg nightly.  5. Lamictal 300 mg daily.  6. Mirtazapine 30 mg nightly.  7. Ativan 1 mg q.i.d.  8. Abilify 30 mg daily.  9. Iron 65 mg b.i.d.  10.Fluoxetine 20 mg daily.  11.Trazodone 300 mg nightly.  12.Vivelle-Dot 0.1 mg weekly.  13.Spiriva 1 puff daily.  14.Methocarbamol 500 mg as needed.  15.Hydrocodone/APAP 10/325 as needed.  16.Ibuprofen 200 mg as needed.  17.Proventil 2 puffs as needed.   SHE HAS NO KNOWN DRUG ALLERGIES.   SOCIAL HISTORY:  She is divorced.  She continues to smoke 1 pack of  cigarettes per day.  There is no significant alcohol use.   FAMILY HISTORY:  Significant for her mother who had breast and lung  cancer, as well as emphysema.   REVIEW OF SYSTEMS:  Unremarkable, except for as stated above.  She did  lose approximately 40 pounds recently.   PHYSICAL EXAMINATION:  She is 5 feet 3 inches  tall, 185 pounds.  Temperature is 97.8.  Blood pressure is 110/72.  Heart rate is 92.  Oxygen saturation is 95% on room air.  HEENT:  Pupils reactive.  Extraocular muscles are intact.  There is no  sinus tenderness.  No nasal discharge.  Has a Mallampati 3 airway.  There are no oral lesions.  No lymphadenopathy.  No thyromegaly.  HEART:  Was S1 and S2.  CHEST:  No wheezing or rales.  ABDOMEN:  Obese, soft, and non-tender.  EXTREMITIES:  There is no edema, cyanosis, or clubbing.  NEUROLOGIC EXAM:  She did have mild decreased sensation in her feet.  Reflexes were 1+, but symmetric.  She had 5/5 strength.   IMPRESSION:  Possible restless leg syndrome.  She has not really had  much of a benefit from the use of Requip or iron supplementation.  I am  not sure if she has actually had ferritin level checked.  Therefore, I  would like to have her undergo repeat laboratory tests to determine if  she would benefit from continued supplementation with iron and vitamin  C.  If her laboratory results are unremarkable, then we may need to  adjust her medical regimen by either changing her to an alternative  dopamine agonist, or to add an additional agent.  Additionally, it is  possible that several of the medications that she is on for her bipolar  disorder could in fact be contributing significantly to her leg  symptoms.  The other thing that I am concerned about is that some of her  leg movements seen on her overnight polysomnogram may actually be  related to the use of her CPAP for her sleep apnea.  Particularly, since  she has had a significant decrease in her weight, I feel that it would  be indicated for her to undergo repeat overnight polysomnogram, one, to  determine if she does still have residual sleep apnea, and two, to  determine if she would need to have an adjustment in the pressure setting of her CPAP machine.  I will follow up with her after I have a  chance to review the results of  her sleep study, but I will call her  with the results  of her lab tests.     Chesley Mires, MD  Electronically Signed    VS/MedQ  DD: 11/07/2006  DT: 11/07/2006  Job #: [id number]   cc:   Collene Gobble, MD  Venia Carbon, MD  Flint Melter Jacolyn Reedy, M.D.  Rennis Harding, M.D.

## 2010-10-19 NOTE — Procedures (Signed)
[name], [name]              ACCOUNT NO.:  [id number]   MEDICAL RECORD NO.:  [id number]          PATIENT TYPE:  OUT   LOCATION:  SLEEP CENTER                 FACILITY:  [facility]   PHYSICIAN:  Chesley Mires, MD        DATE OF BIRTH:  [birth year]   DATE OF STUDY:  11/30/2006                            NOCTURNAL POLYSOMNOGRAM   REFERRING PHYSICIAN:   <REFERRING PHYSICIAN>  Dr. Chesley Mires   INDICATION FOR STUDY:  This is an individual who had an overnight  polysomnogram done on June 04, 2005, and had an apnea/hypopnea index  of 20 with an oxygen saturation nadir of 87%.  She subsequently had a  CPAP titration study and was started on CPAP of 11.  She also has a  history of restless legs syndrome and is currently on ReQuip for this.  She is having continued sleep disruption and daytime sleepiness and is  referred to the sleep lab for further evaluation of this.  She had also  had a significant amount of weight loss, and the test is also being doen  to determine whether she has persistent sleep-disordered breathing.   EPWORTH SLEEPINESS SCORE:  Four.   MEDICATIONS:  Calcium, Senna, Vesicare, Lamictal, mirtazapine, Abilify,  iron, fluoxetine, trazodone, Spiriva, methocarbamol, hydrocodone,  ibuprofen, Proventil, and hydrochlorothiazide.  The patient took a  Seroquel, ibuprofen, and ReQuip on the night of the study.   SLEEP ARCHITECTURE:  Total recording time is 483 minutes.  Total sleep  time was 421 minutes.  Sleep efficiency was 87%.  Sleep latency was 1  minute, which is significantly reduced.  REM latency was 37 minutes,  which is significantly reduced.  The patient was observed in all stages  of sleep.  She slept in both the supine and nonsupine position.  She  followed a split-night-study protocol.  During the diagnostic portion of  the study she had a significant amount of sleep disruption related to  respiratory events.   RESPIRATORY DATA:  The average respiratory rate  was 12.  During the  diagnostic portion of the test, the patient had a apnea/hypopnea index  of 33.4.  There was one central event.  The remainder of the events were  obstructive in nature.  Moderate snoring was noted by the technician.  She did also have a significant positional as well as REM effect.  During the therapeutic portion of the study, she was titrated from a  CPAP pressure setting of 6 to 14 cm of water.  At a CPAP pressure  setting of 12 cmH2O her apnea/hypopnea index was 12.  She had six  central apneic events with no obstructive events at this pressure  setting.  At this pressure setting, snoring was eliminated and she was  observed in both REM sleep and supine sleep.   OXYGEN DATA:  The baseline oxygenation was 96%.  The oxygen saturation  nadir was 85%.  At a CPAP pressure setting of 12 cmH2O, the mean  oxygenation during non-REM sleep was 96%, the mean oxygenation during  REM sleep was 95%, the minimum oxygenation during non-REM sleep was 93%,  and the minimum oxygenation during REM sleep  was 93%.   CARDIAC DATA:  The average heart rate was 70 and the rhythm strip showed  normal sinus rhythm with PVCs.   MOVEMENT/PARASOMNIA:  The periodic limb movement index was 13.1.  The  patient stated that she had broken her right toe earlier in the week and  was having some pain related to this.   IMPRESSION:  This was a split-night study protocol.  During the  diagnostic portion of the test, the patient was found to have an  apnea/hypopnea index of 33.4 and oxygen saturation nadir of 85%  consistent with a diagnosis of severe obstructive sleep apnea.  She did  have a significant REM and positional effect to her sleep apnea.  During  the therapeutic portion of the study she was titrated to a CPAP pressure  setting of 12 cmH2O.  At this pressure setting, she was observed in both  REM sleep and supine sleep and snoring was eliminated.  She did have a  persistence in elevation of  her apnea/hypopnea index but these events  were central in nature.  Her periodic limb movement index was 13.1.  However, the majority of these were noted prior to the initiation of  CPAP, and once she was on an adequate CPAP pressure setting she had very  few leg movements.      Chesley Mires, MD  Diplomat, American Board of Sleep Medicine  Electronically Signed     VS/MEDQ  D:  12/05/2006 21:01:37  T:  12/06/2006 11:11:28  Job:  [id number]

## 2010-10-19 NOTE — Letter (Signed)
April 30, 2007    Kem Kays   RE:  ZAINEB, NOWACZYK  MRN:  [id number]  /  DOB:  [birth year]   Dear Ivin Booty:   It is my pleasure to have treated you recently as a new patient in my  office.  I appreciate your confidence and the opportunity to participate  in your care.   Since I do have a busy inpatient endoscopy schedule and office schedule,  my office hours vary weekly.  I am, however, available for emergency  calls every day through my office.  If I cannot promptly meet an urgent  office appointment, another one of our gastroenterologists will be able  to assist you.   My well-trained staff are prepared to help you at all times.  For  emergencies after office hours, a physician from our gastroenterology  section is always available through my 24-hour answering service.   While you are under my care, I encourage discussion of your questions  and concerns, and I will be happy to return your calls as soon as I am  available.   Once again, I welcome you as a new patient and I look forward to a happy  and healthy relationship.    Sincerely,      Sandy Salaam. Deatra Ina, MD,FACG  Electronically Signed   RDK/MedQ  DD: 04/30/2007  DT: 04/30/2007  Job #: [id number]

## 2010-10-19 NOTE — Assessment & Plan Note (Signed)
[facility]                             PULMONARY OFFICE NOTE   NAME:ROBBINSCheryllynn, Sarff                       MRN:          [id number]  DATE:03/07/2007                            DOB:          [birth year]    I saw Ms. Streed today in followup for her severe obstructive sleep  apnea, restless leg syndrome and COPD.   She says that she has not been fitted with a new mask.  Apparently her  home care company is not able to get this kind of mask and as a result  she has been having trouble using her CPAP machine.  She says that she  goes to bed at around 9:30 at night.  She takes her Requip at about 7:00  at night.  She is not having much problems with her leg symptoms anymore  however she wakes up 3-7 times during the night and after she does this  she will not be able to fall asleep again for about another hour.  She  says that she will sometimes walk around or eat and then she gets up at  about 8:00 in the morning but still feels tired although she will  sometimes take a nap during the day.  She says that she will often times  also end up staring at the clock and this seems to get her stimulated as  well.  She continues to take Ambien and Seroquel at night and says that  this makes her groggy but does not really help her as far as staying  asleep at night.  She says her breathing symptoms are reasonably stable  on her inhaler regimen but that she continues to smoke a pack of  cigarettes a day.   CURRENT MEDICATIONS:  1. Calcium 600 mg daily.  2. VESIcare 5 mg daily.  3. Requip 4 mg nightly.  4. Lamictal 300 mg daily.  5. Abilify 30 mg daily.  6. Iron 65 mg b.i.d.  7. Fluoxetine 30 mg daily.  8. Vivelle-Dot 0.1 mg two tablets weekly.  9. Spiriva 1 puff daily.  10.Seroquel 200 mg nightly.  11.Hydrochlorothiazide 25 mg daily.  12.Zolpidem 10 mg nightly.  13.Ibuprofen as needed.  14.Proventil 2 puffs as needed.   PHYSICAL EXAMINATION:  She is 186  pounds, temperature is 97.9, blood  pressure is 160/78, heart rate is 79, oxygen saturation is 94% on room  air.  HEENT:  No sinus tenderness, no oral lesions.  HEART:  With S1-S2.  CHEST:  Prolonged expiratory phase but no wheezing.  ABDOMEN:  Obese, soft, nontender.  EXTREMITIES:  No edema.   IMPRESSION:  1. Severe obstructive sleep apnea, with CPAP at 12.  In review of her      auto titration it appears that the pressure setting of 12 seems      reasonable for her.  What I will do is have her change to a new      home care company to get fitted with a new mask and hopefully this      will help with her tolerance of her continuous  positive airway      pressure machine.  2. Restless leg syndrome.  Her symptoms are reasonably stable and I      have advised her to decrease her dose of Requip to 3 mg nightly and      monitor her symptoms.  3. Sleep onset and sleep maintenance insomnia.  I will have her      continue on her current sleep aids for the time being I will also      discuss with her various techniques as far as relaxation techniques      and stimulation control techniques and I have also advised her      about sleep restriction with hopes that this will improve her sleep      consolidation.  4. Chronic obstructive pulmonary disease.  She is to continue on      Spiriva and Proventil HFA.  5. Tobacco abuse.  I discussed with her various techniques to assist      with smoking cessation.  I have also discussed with her the      possibility of starting her on Chantix as well as possibly      Wellbutrin.  However I would like to do this in conjunction with      further discussions with her psychiatrist and therefore I will hold      off of starting her on any smoking cessation aids at the present      time until she has a chance to talk this over further with Dr.      Keenan Bachelor.   I will follow up with her in 2 months.     Chesley Mires, MD  Electronically Signed    VS/MedQ   DD: 03/07/2007  DT: 03/07/2007  Job #: [id number]   cc:   Venia Carbon, MD  Flint Melter Jacolyn Reedy, M.D.  Mitzi Hansen

## 2010-10-19 NOTE — Assessment & Plan Note (Signed)
[name] OFFICE NOTE   NAME:ROBBINSLexani, Stephanie                       MRN:          [id number]  DATE:04/30/2007                            DOB:          1950/07/28    REASON FOR CONSULTATION:  History of colon polyps.     Stephanie [name] is a pleasant 60 year old white female referred through  the courtesy of Wyatt Mage, FNP and Dr. Viviana Simpler for evaluation.  She has a history of colon polyps, and was last examined in 2001.  She  has no GI complaints, including change in bowel habits, abdominal pain,  melena, or hematochezia.  She has alternating episodes of diarrhea and  constipation.   PAST GI HISTORY:  Pertinent for hepatitis B at age 43.  She had ulcers  about 6 years ago.   PAST MEDICAL HISTORY:  Pertinent for TIA, arthritis, and anxiety, and a  panic disorder with depression.  She has sleep apnea.  She is status  post cholecystectomy, hypertension, 2 back surgeries, and rotator cuff  repair, and neck surgery.   FAMILY HISTORY:  Pertinent for mother with breast cancer.  Mother also  had ulcerative colitis.   MEDICATIONS:  Vivelle patch.  Proventil.  Propranolol.  Spiriva.  Symbicort.  Seroquel.  Lamotrigine.  Abilify.  Hydrochlorothiazide.  Iron.  Vesicare.  Amoxicillin.  Epineural.  Mucinex.  Zyprexa.  Celebrex.   She has no allergies.   She smokes a pack a day.  She does not drink.  She is divorced and is on  disability for psychiatric disability and back problems.   REVIEW OF SYSTEMS:  Positive for feet swelling, frequent cough, joint  pain, sleeping problems, back pain, leakage of urine, and confusion.   EXAM:  Pulse 80, blood pressure 98/70, weight 198.  HEENT:  EOMI. PERRLA. Sclerae are anicteric.  Conjunctivae are pink.  NECK:  Supple without thyromegaly, adenopathy or carotid bruits.  CHEST:  Clear to auscultation and percussion without adventitious  sounds.  CARDIAC:  Regular  rhythm; normal S1 S2.  There are no murmurs, gallops  or rubs.  ABDOMEN:  She has a prominent venous pattern.  Liver and spleen are not  palpable.  There are no abdominal masses or organomegaly.  EXTREMITIES:  Full range of motion.  No cyanosis, clubbing or edema.  RECTAL:  Deferred.   IMPRESSION:  1. History of colon polyps.  2. Prominent venous pattern raising the question of chronic liver      disease with portal hypertension (remote history of hepatitis B).  3. History of psychiatric disorder.   RECOMMENDATION:  1. Check hepatitis B and C serologies, LFTs, and CBC.  2. Colonoscopy.     Sandy Salaam. Deatra Ina, MD,FACG  Electronically Signed    RDK/MedQ  DD: 04/30/2007  DT: 04/30/2007  Job #: [id number]   cc:   Billie D. Maxie Better, FNP  Venia Carbon, MD
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
## 2010-10-22 NOTE — Discharge Summary (Signed)
[name]  Patient:    Stephanie [name], Stephanie [name]                       MRN: [id number] Adm. Date:  [id number] Disc. Date: [id number] Attending:  Jesse Sans                           Discharge Summary  BRIEF HISTORY:  Stephanie [name] is a 60 year old divorced white female admitted with history of suicidal thoughts with plan to overdose.  The patient reports she has felt increasingly depressed since mid August.  She had back surgery and had to stop her medication, Seroquel.  She has had three subsequent psychiatric hospitalizations since her surgery and was discharged less than a week prior to admission from [facility].  Her sister had taken her to the emergency room.  The patient reported suicidal thoughts with plans to overdose. she complained of feelings of confusion, racing thoughts, difficulty organizing her thinking and obsessing about whether or not she could work She felt helpless, hopeless and worthless.  PAST PSYCHIATRIC HISTORY:  The patient has been depressed for many years and first overdosed around age of 62 or 73.  She has a history of mood swings .She has had multiple admissions as noted with the recent hospitalization at [facility].  She had last been hospitalized at [facility] in January 2001.  She reported a good response to Trileptil, Prozac and Seroquel in the past.  PAST MEDICAL HISTORY:  The patient is followed by Dr. Philipp [name] in [place].  She has a history of hepatitis C.  She had back surgery in August.  MEDICATIONS:  At the time of admission she was on Seroquel and Prozac.  ALLERGIES:  No known drug allergies.  PHYSICAL EXAMINATION:  Physical examination was performed in the emergency department with no significant findings.  MENTAL STATUS EXAMINATION:  On admission revealed a casually dressed, well-groomed small built white female.  She had restless legs throughout the conversation.  Speech was  normal.  Thought processes showed racing thoughts and difficulty concentrating.  Mood was depressed.  Affect was sad and depressed.  She was oriented x 3.  Cognitive functioning was intact.  ADMITTING DIAGNOSES: Axis I:    Bipolar II disorder, depressed. Axis II:   Rule out borderline personality disorder. Axis III:  1. Status post back surgery.            2. Hepatitis C. Axis IV:   Psychosocial stressors moderate. Axis V:    Global assessment of functioning current 30, highest past year was            60.  LABORATORY FINDINGS:  Admission CBC showed a decreased hemoglobin of 11.7, hematocrit 34.4.  Blood chemistries were normal.  Thyroid panel was normal.  HOSPITAL COURSE:  The patient was admitted to the [facility] for treatment of her depression and to prevent any suicidal acting out.  The patient was initially  continued on her Seroquel which was increased and we elected to try her on Trileptil.  We assessed her history of mood swings and it was felt she would mood stabilization medications.  She tolerated medication well and showed very rapid improvement in her mood.  She continued to have some anxiety but was less depressed.  She was sleeping and eating well.  She had only mild racing thoughts.  It was felt that she could be managed  on an outpatient basis since she was denying any further suicidal thoughts.  CONDITION ON DISCHARGE:  The patient is discharged in improved condition with improvement in her mood, normalization of her sleep and appetite, alleviation of any suicidal ideation.  DISPOSITION:  The patient is discharged home.  She is to follow up at [place] with [name] on April 10, 2000.  DISCHARGE MEDICATIONS:  Trileptil 300 mg b.i.d., Prozac 20 mg p.o. q.a.m. and Seroquel 50-100 mg q.i.d.  DISCHARGE INSTRUCTIONS:  The patient was informed she could return to work on April 10, 2000.  FINAL DIAGNOSES: Axis I:     Bipolar II disorder, depressed. Axis II:   No diagnosis. Axis III:  1. Status post back surgery.            2. Hepatitis C. Axis IV:   Psychosocial stressors moderate. Axis V:    Global assessment of functioning current 60, highest in past year            28. DD:  05/17/00 TD:  05/17/00 Job: [id number]

## 2010-10-22 NOTE — Assessment & Plan Note (Signed)
[place] [facility]                             PULMONARY OFFICE NOTE   NAME:Stephanie [name], Stephanie [name]                       MRN:          [id number]  DATE:04/03/2007                            DOB:          1951/03/28    I received a phone call from [name], stating that she was not able  to tolerate the decrease in Requip at 3 mg daily.  She had even tried  doing 3 mg in one day and 4 mg the other day.  The day that she takes 4  mg, her leg symptoms are well-controlled, but not on the days that she  takes 3 mg.  I therefore advised her to increase her Requip to 4 mg  daily.  I have called a prescription for this into her pharmacy.     Chesley Mires, MD  Electronically Signed    VS/MedQ  DD: 04/03/2007  DT: 04/03/2007  Job #: [id number]

## 2010-10-22 NOTE — Procedures (Signed)
Stephanie [name], Stephanie [name]              ACCOUNT NO.:  [id number]   MEDICAL RECORD NO.:  [id number]          PATIENT TYPE:  OUT   LOCATION:  SLEEP CENTER                 FACILITY:  [facility]   PHYSICIAN:  Danton Sewer, M.D. [facility] DATE OF BIRTH:  06-23-1950   DATE OF STUDY:  06/04/2005                              NOCTURNAL POLYSOMNOGRAM   .   REFERRING PHYSICIAN:  Collene Gobble, M.D.   DATA OF STUDY:  June 04, 2005.   INDICATIONS FOR STUDY:  Hypersomnia with sleep apnea.   EPWORTH SCORE:  10.   SLEEP ARCHITECTURE:  The patient had a total sleep time of 348 minutes with  a normal slow wave sleep but significantly decreased REM. Sleep onset  latency was prolonged at 73 minutes, and REM onset was prolonged as well.  Sleep efficiency was 77%.   RESPIRATORY DATA:  The patient was found to have 93 hypopneas and 20 apneas  for a respiratory disturbance index of 20 events per hour. The events were  not positional, but there was moderate snoring noted.   OXYGEN DATA:  There was O2 desaturation as low as 87% with the patient's  obstructive events.   CARDIAC DATA:  No clinically significant cardiac arrhythmias.   MOVEMENT/PARASOMNIA:  The patient was found to have 123 leg jerks with 5 per  hour resulting in arousal awakening.   IMPRESSION/RECOMMENDATIONS:  1.  Mild to moderate obstructive sleep apnea with a respiratory disturbance      index of 20 events per hour and O2 desaturation as low as 87%. Treatment      for this degree of sleep apnea may include weight loss alone if      applicable, upper airway surgery, oral appliance, and CPAP.  2.  Large numbers of leg jerks with significant sleep disruption. This may      simply be a manifestation of the      patient's obstructive sleep apnea; however, I cannot exclude the      possibility of a secondary sleep disorder such as the restless leg      syndrome/periodic leg movement syndrome. Clinical correlation is      suggested.     ______________________________  Danton Sewer, M.D. [facility]  Diplomate, American Board of Sleep  Medicine     KC/MEDQ  D:  06/10/2005 11:04:37  T:  06/10/2005 15:37:53  Job:  [id number]

## 2010-10-22 NOTE — Discharge Summary (Signed)
NAMECARLETTE, PALMATIER              ACCOUNT NO.:  [id number]   MEDICAL RECORD NO.:  [id number]          PATIENT TYPE:  INP   LOCATION:  5004                         FACILITY:  [place]   PHYSICIAN:  Rennis Harding, M.D.        DATE OF BIRTH:  1950-06-25   DATE OF ADMISSION:  07/20/2006  DATE OF DISCHARGE:  07/24/2006                               DISCHARGE SUMMARY   DISPOSITION:  Home.   ADMITTING DIAGNOSES:  1. Lumbar degenerative disk disease, L4-S1.  Recurrent spinal      stenosis.  2. Previous lumbar laminectomy.  3. Chronic obstructive pulmonary disease.  4. Tobacco abuse.  5. Depression and bipolar disorder.  6. History of anemia.  7. History of hepatitis.  8. Arthritis.   DISCHARGE DIAGNOSES:  1. Status post L4-S1 decompression and fusion.  2. Postoperative blood loss anemia.  3. Postoperative hypokalemia.  4. Postoperative hyperglycemia.  5. Lumbar degenerative disk disease, L4-S1.  Recurrent spinal      stenosis.  6. Previous lumbar laminectomy.  7. Chronic obstructive pulmonary disease.  8. Tobacco abuse.  9. Depression and bipolar disorder.  10.History of anemia.  11.History of hepatitis.  12.Arthritis.   PROCEDURE:  On July 20, 2006, the patient was taken to the operating  room for an L4-S1 revision, decompression, posterior spinal fusion with  pedicle screws, PLIF.   SURGEON:  Rennis Harding, MD   ASSISTANT:  Karenann Cai, PAC.   ANESTHESIA:  General.   CONSULTS:  None.   LABS:  CBC with diff preoperatively was within normal limits.  Postoperatively, H&H were monitored for 4 days and reached a low of 8.9  and 24.9.  The coags preoperatively were normal.  Complete metabolic  panel for preop was normal.  On postop day 1, basic metabolic panel with  potassium 3.4, glucose 162, BUN of 4.  On postoperative day 2, potassium  was 3.0, glucose 144, BUN of 2; otherwise, normal.  UA from July 17, 2006, shows specific gravity of 1.035 and a few epithelials and a  few  bacteria; otherwise, normal.  Blood typing was AB positive.  Antibody  screen was negative.  A urine culture from July 17, 2006, showed no  growth.   X-RAYS:  X-ray was used intraoperatively for localization.  No EKG is  seen in the chart.   BRIEF HISTORY:  The patient is a 60 year old female who has been  followed by Dr. Patrice Paradise.  She has had progressive increasing back pain and  extension into her lower extremities.  She has had a previous lumbar  laminectomy.  Unfortunately, she has had increasing pain and failure to  improve with conservative treatments.  Because of her x-ray and MRI  findings of spondylolisthesis and spondylosis, it was thought her best  course of management would be revision, decompression and a posterior  spinal fusion, L4-S1.  Risks and benefits were discussed with the  patient by Dr. Patrice Paradise.  She indicated understanding and opted to proceed.   HOSPITAL COURSE:  The patient is a 60 year old female who was admitted  to the hospital and taken to  the operating room on July 20, 2006.  She tolerated the above-listed procedure well without any intraoperative  complications.  She was transferred to the recovery room in stable  condition.   Postoperatively, routine orthopedic spine protocol was followed, and she  progressed along quite well.   Physical Therapy and Occupational Therapy worked with her on a daily  basis.  They worked with her on an ambulation program, back precautions,  as well as brace use.  She progressed along very well with them and got  to the point that she was independent and safe with all of the above  prior to discharge.   Her hemoglobin did reach 8.9 on July 23, 2006; however, she was  asymptomatic and did not require any treatment for this.   By July 24, 2006, the patient was doing very well.  She was  medically stable and ready for discharge.  She had met all orthopedic  goals and was ready for discharge.    DISCHARGE PLAN:  The patient is a 60 year old female status post  laminectomy and fusion of lumbar spine.  Activity:  Daily ambulation  program.  Brace on when she is up.  Back precautions at all times.  No  lifting greater than 5 pounds.  The patient may shower.   MEDICATIONS:  Vicodin for pain.  Robaxin for muscle spasms.  Multivitamin daily.  Calcium 1200 daily.  Colace twice daily.  Laxative  as needed.  Avoid NSAIDs for 3 months.  Resume home medications.   DIET:  Regular diet as tolerated.   FOLLOWUP:  Two weeks postoperatively with Dr. Patrice Paradise.   CONDITION ON DISCHARGE:  Stable, improved.   DISPOSITION:  The patient being discharged to her home with her family's  assistance, as well as [facility] and Occupational  Therapy.      Karenann Cai, P.A.      Rennis Harding, M.D.  Electronically Signed    CM/MEDQ  D:  09/28/2006  T:  09/28/2006  Job:  [id number]

## 2010-10-22 NOTE — Op Note (Signed)
Stephanie [name], [name]              ACCOUNT NO.:  [id number]   MEDICAL RECORD NO.:  [id number]          PATIENT TYPE:  INP   LOCATION:  2550                         FACILITY:  [place]   PHYSICIAN:  Rennis Harding, M.D.        DATE OF BIRTH:  [birth year]   DATE OF PROCEDURE:  07/20/2006  DATE OF DISCHARGE:                               OPERATIVE REPORT   DIAGNOSIS:  1. Lumbar degenerative disc disease L4-L5 and L5-S1.  2. Recurrent lumbar spinal stenosis, history of previous laminectomy.  3. Chronic back and left greater than right lower extremity pain.   PROCEDURE:  1. Revision L4-L5 and L5-S1 laminectomy.  2. Segmental pedicle screw instrumentation L4 through S1 using the      [employer] Spine Encompass System.  3. Posterior spinal fusion L4 through S1.  4. Transforaminal lumbar interbody fusion L4-L5 and L5-S1 with      placement of two PEEK spacers.  5. Local autogenous bone graft supplemented with bone morphogenic      protein.   SURGEON:  Rennis Harding, M.D.   ASSISTANTJaclyn Shaggy Mahar, P.A.-C.   ANESTHESIA:  General endotracheal.   ESTIMATED BLOOD LOSS:  625 mL   COMPLICATIONS:  None.   COUNTS:  Needle and sponge count correct.   INDICATIONS:  The patient is a pleasant 60 year old female with history  of previous laminectomy at L4-L5 and L5-S1.  She developed progressive  lower back and left greater than right lower extremity pain.  Her  imaging studies show severe disc degeneration at L4-L5 and L5-S1 with  relative preservation of the super adjacent disc.  She has failed all  attempts at conservative treatment and now presents for L4 to S1  decompression and fusion.  The risks, benefits, and alternatives were  reviewed.   PROCEDURE:  After informed consent, the patient was taken to the  operating room.  She underwent general endotracheal anesthesia without  difficulty and given prophylactic IV antibiotics.  Neural monitoring was  established in the form of lower extremity EMGs  and SSEPs. She was  carefully turned prone onto the Wilson frame.  All bony prominences were  padded.  The face and eyes were protected at all times.  Her back was  prepped and draped in the usual sterile fashion.  The previous incision  was utilized and extended several cm proximally and distally.  Dissection was carried through the deep fascia and scar. Subperiosteal  exposure was carried out to the tips of the transverse processes of L4,  L5, and the sacral ala bilaterally.  Interoperative x-ray was taken to  confirm the levels.  The deep retractor was placed.   We then placed pedicle screws at L4, L5 and S1 bilaterally using  anatomic probing technique.  Each pedicle was palpated and there were no  breeches.  We placed a 5.5 mm screw on the left at L4.  At L4 on the  right, a 6.5 mm screw was utilized. 6.5 mm screws were also utilized at  L5 bilaterally.  7.5 mm screws were placed into the sacrum.  The screw  purchase was  good.  Each screw was stimulated using triggered EMGs and  there were no deleterious changes.   We then turned our attention to completing our revision laminectomy.  The previous laminotomies were identified.  Loupe, headlight  magnification, and curets were used to dissect through the epidural  fibrosis, identifying the spinal canal.  A high speed bur, Leksell, and  Kerrisons were used to complete the laminectomy.  The L4, L5 and S1  nerve roots were identified and decompressed.   We then turned our attention to completing the posterior spinal fusion  by decorticating the transverse processes of L4, L5 and S1 bilaterally.  The local bone graft obtained from the laminectomy was packed into the  lateral gutters.  We then elected to proceed with transforaminal lumbar  interbody fusion at L4-L5 and L5-S1 on the left side.  The remaining  facet joints were osteotomized, the exiting traversing nerve roots were  identified.  Free running EMGs were monitored. The disc  spaces were  entered and a radical discectomy was carried out at both levels.  Curets  were used to scrape the cartilaginous endplates.  The disc space was  dilated up to 9 mm at L4-L5 and 7 mm at L5-S1.  We placed a 9-mm PEEK  cage at L4-5 which had been packed with a BMP sponge and a 7 mm cage at  L5-S1, also packed with BMP.  Before placing the cages, we packed the  disc space with local bone graft from the laminectomy.  We then placed  short segment titanium rods into the polyaxial screw heads and  compression was applied before shearing off the locking caps.   The wound was irrigated.  Hemostasis was achieved.  Gelfoam was left  over the exposed epidural space.  A deep Hemovac drain was left.  The  deep fascia was closed with running #1 Vicryl suture.  The subcutaneous  layer was closed with 2-0 Vicryl and 0 Vicryl.  A 3-0 subcuticular  Vicryl suture was used on the skin edges followed by Dermabond.  A  sterile dressing was applied.  The patient was turned supine, extubated  without difficulty, and transferred to recovery in stable condition able  to move her upper and lower extremities.   It should be noted my assistant, [employer], P.A.-C., was present  throughout the procedure including the positioning, the exposure, the  decompression, the instrumentation, the fusion, and she also assisted  with wound closure.      Rennis Harding, M.D.  Electronically Signed     MC/MEDQ  D:  07/20/2006  T:  07/20/2006  Job:  [id number]

## 2010-10-22 NOTE — Procedures (Signed)
Stephanie [name], Stephanie [name]              ACCOUNT NO.:  [id number]   MEDICAL RECORD NO.:  [id number]          PATIENT TYPE:  OUT   LOCATION:  SLEEP CENTER                 [facility]ACILITY[facility]   PHYSICIAN:  Danton Sewer, M.D. [facility] DATE OF BIRTH:  1951-05-15   DATE OF STUDY:  06/27/2005                              NOCTURNAL POLYSOMNOGRAM   REFERRING PHYSICIAN:  Dr. Baltazar Apo   INDICATION FOR THE STUDY:  Hypersomnia with sleep apnea. The patient was  diagnosed with mild to moderate obstructive sleep apnea at a previous  diagnostic nocturnal polysomnogram.   EPWORTH SCORE:  14   SLEEP ARCHITECTURE:  The patient had a total sleep time of 323 minutes with  adequate slow wave sleep but decreased REM. Sleep onset latency was  borderline increased, as was REM onset. Sleep efficiency was decreased to  75%.   RESPIRATORY DATA:  The patient underwent CPAP titration with a medium  Respironics Comfort full face mask. The pressure was increased primarily due  to snoring and at 11 cm snoring was totally eliminated and the patient had  excellent quantities of slow wave sleep and REM.   OXYGEN DATA:  The patient had O2 desaturation as low as 89%.   CARDIAC DATA:  No clinically significant cardiac arrhythmias.   MOVEMENT/PARASOMNIA:  The patient was found to have 162 leg jerks with seven  per hour resulting in arousal awakening. These did not seem to improve with  optimal CPAP pressure.   IMPRESSION/RECOMMENDATION:  1.  Good control of previously diagnosed obstructive sleep apnea with 11 cm      of CPAP. The patient used a medium Respironics Comfort full face mask      and seemed to tolerate the pressure quite well.  2.  Large numbers of leg jerks with significant sleep disruption. If the      patient has classic symptoms for the restless leg syndrome, I would      treat as such. If she does not, I would treat her obstructive sleep      apnea first and see how she responds clinically.    ______________________________  Danton Sewer, M.D. [facility]  Diplomate, American Board of Sleep  Medicine    KC/MEDQ  D:  07/05/2005 11:28:41  T:  07/05/2005 16:18:34  Job:  [id number]

## 2010-10-22 NOTE — Assessment & Plan Note (Signed)
[facility]                               PULMONARY OFFICE NOTE   NAME:Stephanie [name], Stephanie [name]                       MRN:          [id number]  DATE:04/17/2006                            DOB:          29-Mar-1951    SUBJECTIVE:  This is a followup visit for Stephanie [name] who is a 60 year old  woman with obstructive sleep apnea, restless leg syndrome and chronic  obstructive pulmonary disease.  She tells me that since our last visit her  breathing has been doing well.  She takes her Spiriva reliably and uses only  rare supplemental albuterol.  Her compliance with her CPAP has been good and  she feels that this has helped her daytime sleepiness.  Her biggest problems  have come regarding her restless leg syndrome.  She is on 4 mg of Requip  q.h.s. and continues to have some calf cramping and leg pain.  Her jumpy  legs have improved for the most part.  At our last visit we checked  electrolytes and an iron panel to look for possible contributors to her leg  cramping and poor control of her restless leg syndrome.   OBJECTIVE:  GENERAL APPEARANCE: In general, this is a pleasant woman who is  in no distress.  VITAL SIGNS: Her weight is 206 pounds, temperature 97.4, blood pressure  102/78, heart rate 85, SPO2 95% on room air.  HEENT:  Exam is benign.  LUNGS:  Are distant but clear bilaterally without wheezing.  The rest of her exam is unchanged from our previous visit.   LABORATORY DATA:  Her electrolytes were completely within normal limits with  a potassium of 3.9, calcium of 9.2.  Her iron studies showed an iron level  of 50 which is at the low end of normal range, normal transferrin at 224.6  and decreased iron saturation at 15.9%.   IMPRESSION:  1. Chronic obstructive pulmonary disease, stable on Spiriva.  2. Obstructive sleep apnea, treated adequately with CPAP at 11 cm of water      with improved compliance.  3. Restless leg syndrome and periodic movement  disorder.  4. Borderline iron deficiency.   PLANS:  1. Will start iron sulfate 325 mg b.i.d. in order to see if this can      improve her leg cramps and control her restless leg syndrome.  2. She will otherwise continue her CPAP as ordered and her Spiriva.  3. She has had her flu shot already.  4. She will followup with me in several months or sooner should she have      any difficulty.  5. We discussed the possibility of referral to one of our sleep      specialists here at [facility], if we are not able to get good control of      her lower extremity symptoms.   ______________________________  Collene Gobble, MD   RSB/MedQ  DD: 04/17/2006  DT: 04/17/2006  Job #: [id number]

## 2010-10-30 IMAGING — CR DG HIP COMPLETE 2+V*R*
3 series · 3 of 3 positions shown · non-contrast
Comparison: Bone scan of same date.  Lumbar spine plain films of
11/06/2009

CLINICAL DATA: Right-sided hip pain.  Abnormal bone scan with
increased activity about the left hip.

RIGHT HIP - COMPLETE 2+ VIEW

[t pelvis a.p.]
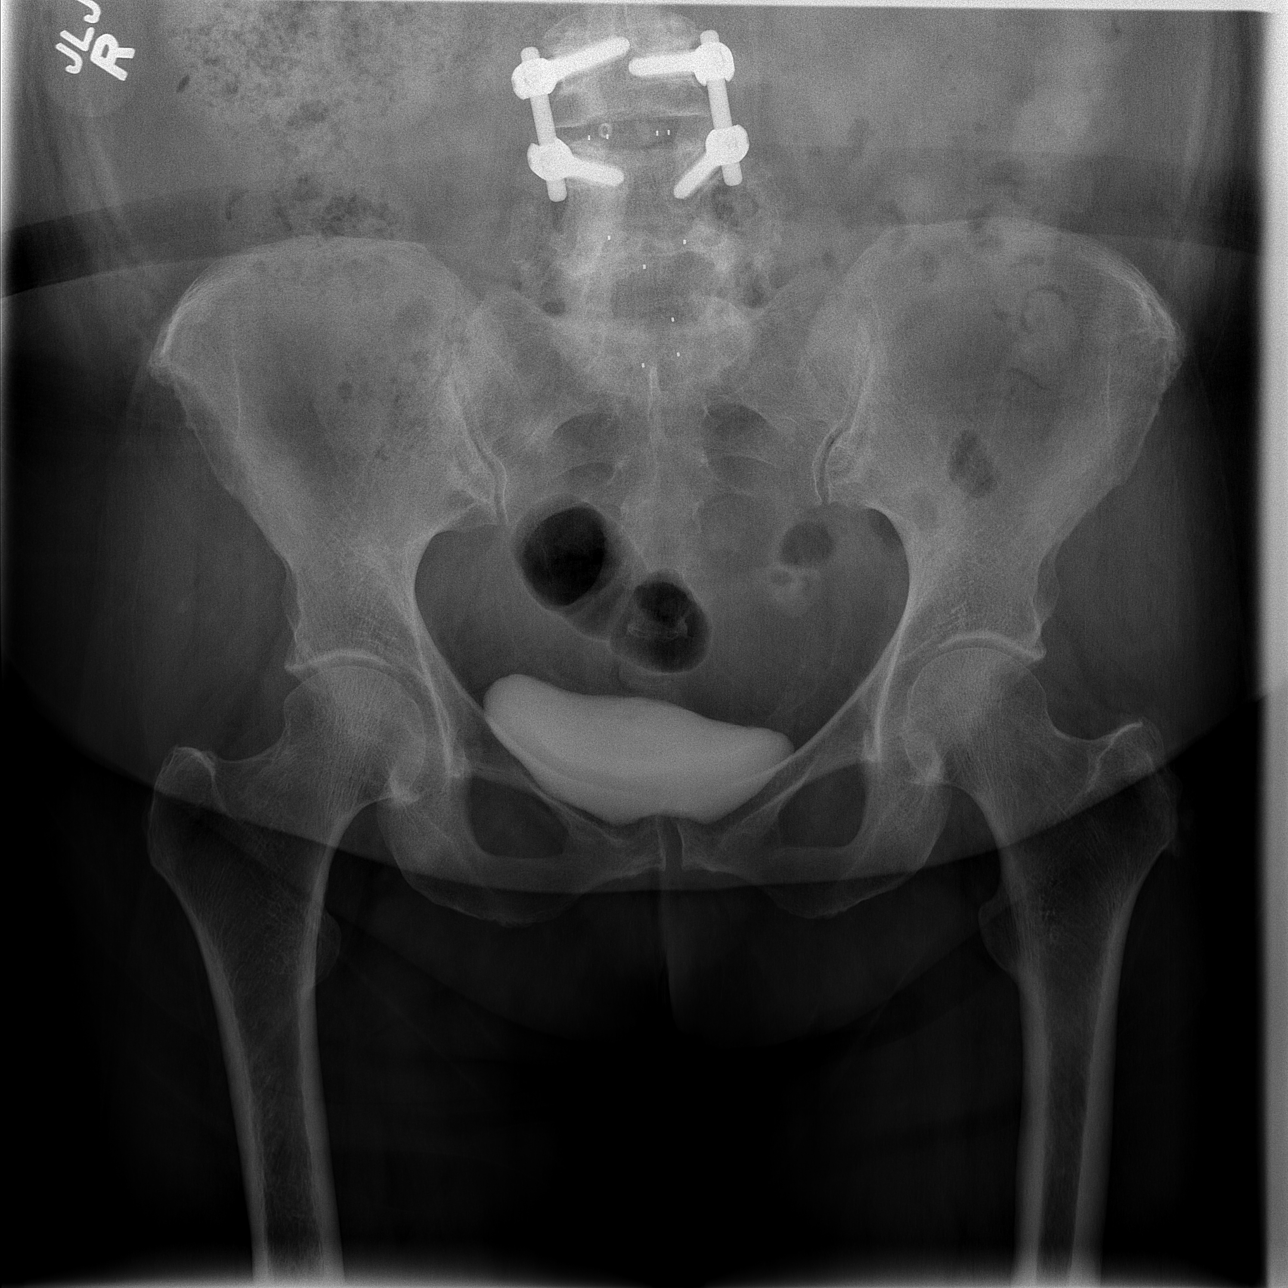

[t hip ap right]
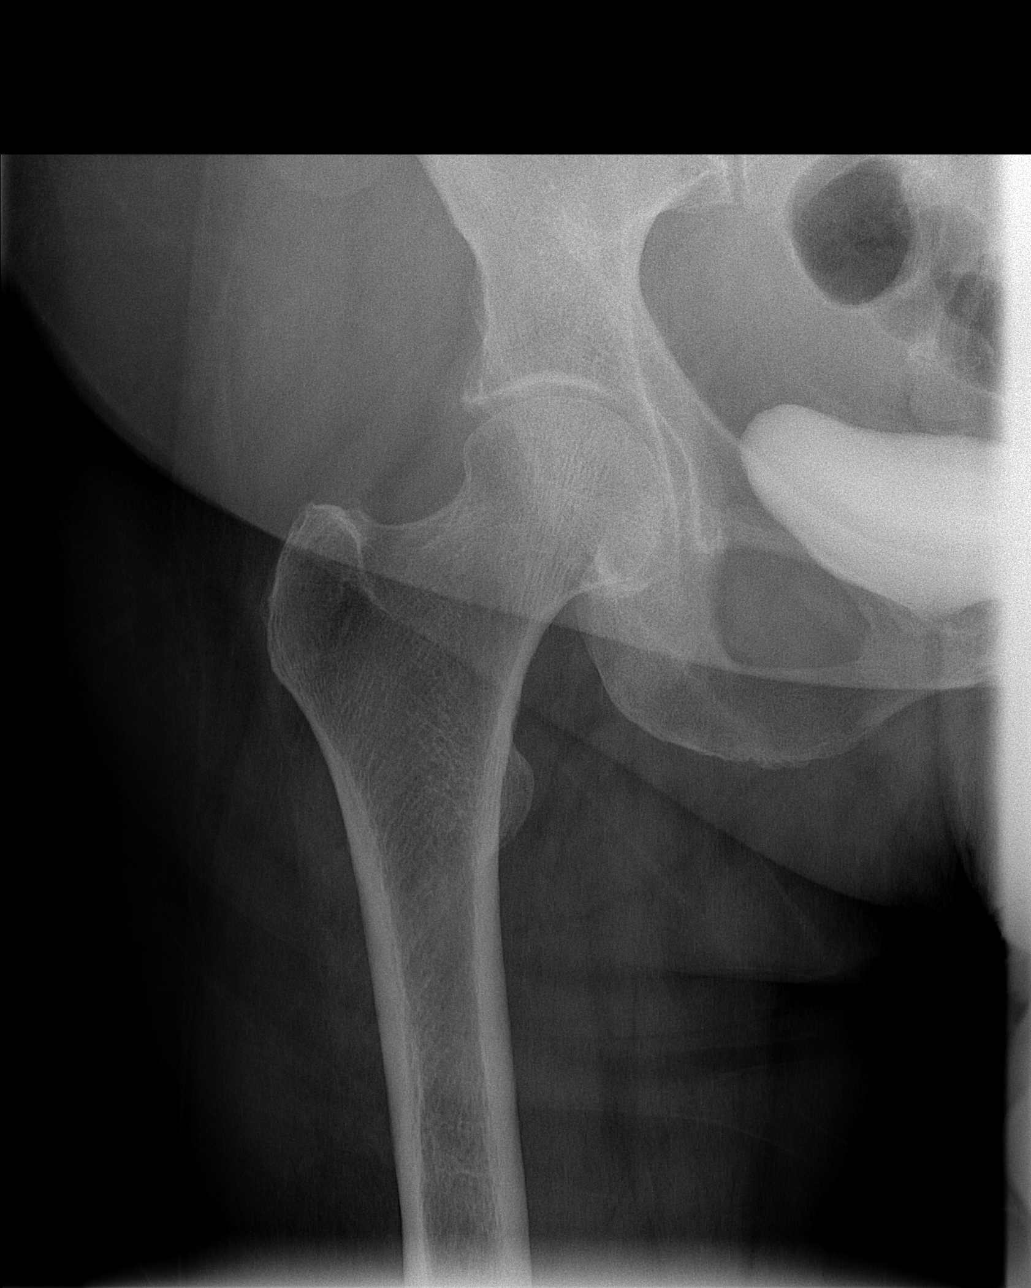

[t hip frog leg right]
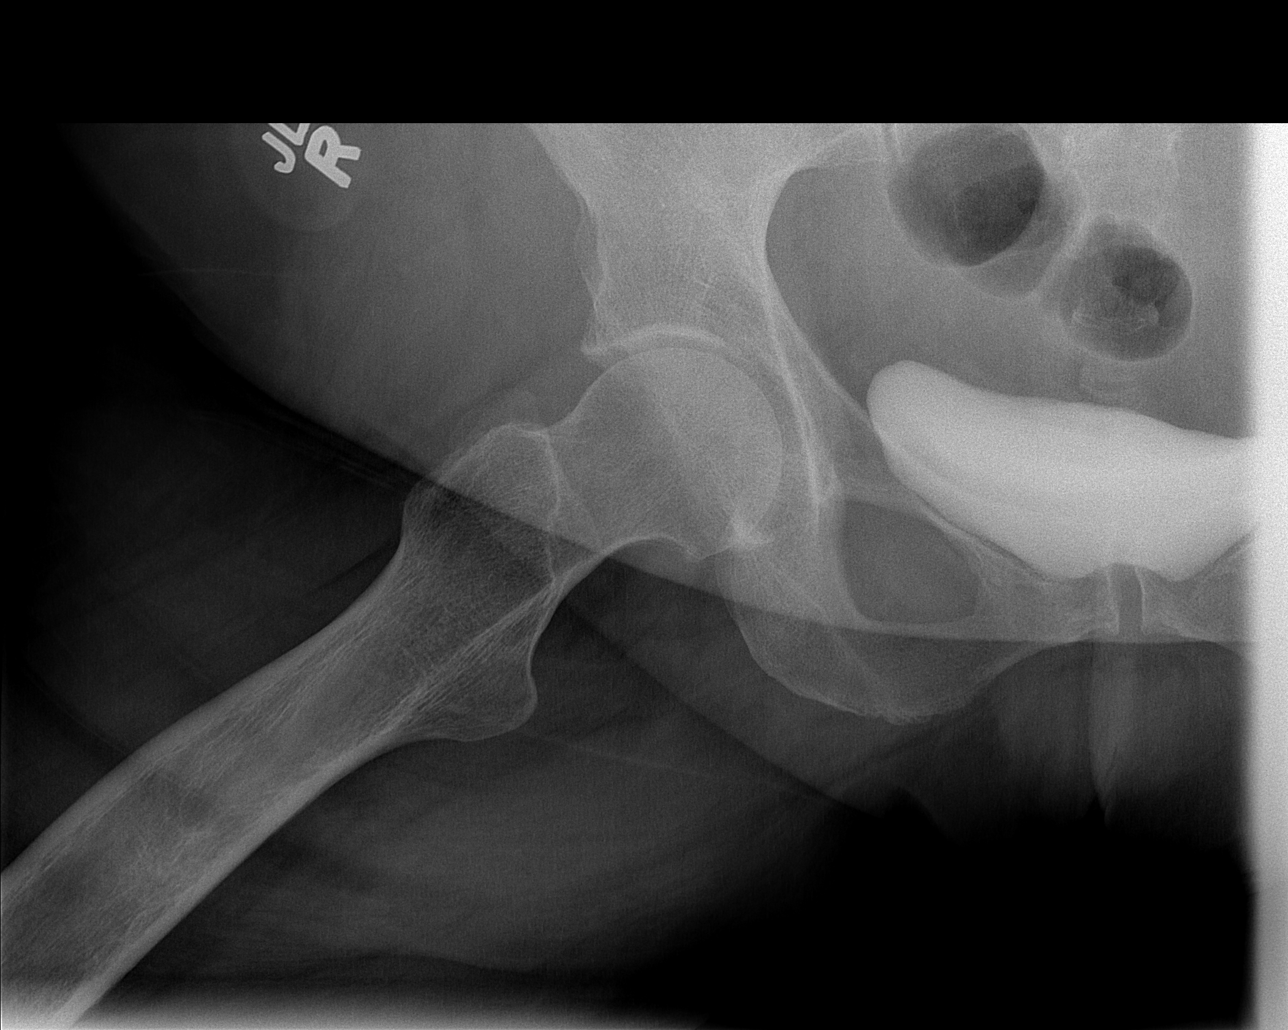

[3 of 3 positions shown; findings below may reference images not displayed]

FINDINGS: Lower lumbar spine fixation.  Contrast within urinary
bladder from recent chest CT.

Osseous irregularity about the greater trochanter left femur is
favored to represent insertional enthesopathic change.  No well-
defined lesion.  Both femoral heads are located.  Bilateral
osteoarthritis of the hips is mild.  Sacroiliac joints are
symmetric.
IMPRESSION: 1.  No acute findings about the right hip
2.  Osseous irregularity about the left greater trochanter is
likely due to enthesopathic change.

## 2010-12-06 ENCOUNTER — Other Ambulatory Visit: Payer: Self-pay | Admitting: Family Medicine

## 2010-12-06 DIAGNOSIS — E785 Hyperlipidemia, unspecified: Secondary | ICD-10-CM

## 2010-12-06 DIAGNOSIS — I10 Essential (primary) hypertension: Secondary | ICD-10-CM

## 2010-12-07 ENCOUNTER — Other Ambulatory Visit (INDEPENDENT_AMBULATORY_CARE_PROVIDER_SITE_OTHER): Payer: Medicare Other | Admitting: Family Medicine

## 2010-12-07 DIAGNOSIS — E785 Hyperlipidemia, unspecified: Secondary | ICD-10-CM

## 2010-12-07 DIAGNOSIS — I10 Essential (primary) hypertension: Secondary | ICD-10-CM

## 2010-12-07 LAB — BASIC METABOLIC PANEL
BUN: 16 mg/dL (ref 6–23)
CO2: 28 mEq/L (ref 19–32)
Calcium: 9.2 mg/dL (ref 8.4–10.5)
Creatinine, Ser: 1 mg/dL (ref 0.4–1.2)
Glucose, Bld: 109 mg/dL — ABNORMAL HIGH (ref 70–99)

## 2010-12-07 LAB — LIPID PANEL
Cholesterol: 221 mg/dL — ABNORMAL HIGH (ref 0–200)
Total CHOL/HDL Ratio: 3
Triglycerides: 191 mg/dL — ABNORMAL HIGH (ref 0.0–149.0)

## 2010-12-08 ENCOUNTER — Encounter: Payer: Self-pay | Admitting: Family Medicine

## 2010-12-13 ENCOUNTER — Ambulatory Visit (INDEPENDENT_AMBULATORY_CARE_PROVIDER_SITE_OTHER): Payer: Medicare Other | Admitting: Family Medicine

## 2010-12-13 ENCOUNTER — Encounter: Payer: Self-pay | Admitting: Family Medicine

## 2010-12-13 VITALS — BP 122/80 | HR 87 | Temp 98.0°F | Ht 63.0 in | Wt 190.0 lb

## 2010-12-13 DIAGNOSIS — R739 Hyperglycemia, unspecified: Secondary | ICD-10-CM | POA: Insufficient documentation

## 2010-12-13 DIAGNOSIS — Z0000 Encounter for general adult medical examination without abnormal findings: Secondary | ICD-10-CM

## 2010-12-13 DIAGNOSIS — Z1231 Encounter for screening mammogram for malignant neoplasm of breast: Secondary | ICD-10-CM

## 2010-12-13 DIAGNOSIS — I10 Essential (primary) hypertension: Secondary | ICD-10-CM

## 2010-12-13 DIAGNOSIS — R7309 Other abnormal glucose: Secondary | ICD-10-CM

## 2010-12-13 DIAGNOSIS — E785 Hyperlipidemia, unspecified: Secondary | ICD-10-CM

## 2010-12-13 DIAGNOSIS — Z2911 Encounter for prophylactic immunotherapy for respiratory syncytial virus (RSV): Secondary | ICD-10-CM

## 2010-12-13 DIAGNOSIS — J449 Chronic obstructive pulmonary disease, unspecified: Secondary | ICD-10-CM

## 2010-12-13 DIAGNOSIS — B181 Chronic viral hepatitis B without delta-agent: Secondary | ICD-10-CM

## 2010-12-13 LAB — HEPATIC FUNCTION PANEL
Albumin: 3.9 g/dL (ref 3.5–5.2)
Total Protein: 6.8 g/dL (ref 6.0–8.3)

## 2010-12-13 MED ORDER — METFORMIN HCL ER 500 MG PO TB24: 500.0000 mg | ORAL_TABLET | Freq: Every day | ORAL | Status: DC

## 2010-12-13 MED ORDER — NYSTATIN 100000 UNIT/GM EX POWD: Freq: Two times a day (BID) | CUTANEOUS | Status: DC

## 2010-12-13 NOTE — Progress Notes (Signed)
I have personally reviewed the Medicare Annual Wellness questionnaire and have noted 1. The patient's medical and social history 2. Their use of alcohol, tobacco or illicit drugs 3. Their current medications and supplements 4. The patient's functional ability including ADL's, fall risks, home safety risks and hearing or visual             impairment. 5. Diet and physical activities- not very active. 6. Evidence for depression or mood disorders- denies depression, mood has been stable on Lamictal 300 mg daily, Abilify 30 mg daily, Seroquel 300 mg three times a day, and Klonopin 4m two times a day     Patient Active Problem List  Diagnoses  . HEPATITIS B, CHRONIC  . Benign Neoplasm of Colon  . UNSPECIFIED VITAMIN D DEFICIENCY  . HYPERLIPIDEMIA  . TOBACCO ABUSE  . OBSTRUCTIVE SLEEP APNEA  . RESTLESS LEGS SYNDROME  . DISORDER, NERVOUS SYSTEM NOS  . RHEUMATIC FEVER  . HYPERTENSION  . RHINITIS  . COPD  . SMALL BOWEL OBSTRUCTION  . OTHER CONSTIPATION  . CELLULITIS AND ABSCESS OF OTHER SPECIFIED SITE  . FOLLICULITIS  . SKIN LESION  . HIP PAIN  . BACK PAIN, LUMBAR, CHRONIC  . SCIATICA, LEFT  . INSOMNIA  . EDEMA, LOCALIZED  . NAUSEA ALONE  . INCONTINENCE  . ABDOMINAL PAIN  . PELVIC PAIN, ACUTE  . MAMMOGRAM, ABNORMAL, RIGHT  . UNS ADVRS EFF OTH RX MEDICINAL&BIOLOGICAL SBSTNC  . BIPOLAR AFFECTIVE DISORDER, HX OF  . PERSONAL HISTORY OF COLONIC POLYPS  . DYSURIA  . INTERTRIGO, CANDIDAL  . RIB PAIN, RIGHT SIDED  . Routine general medical examination at a health care facility   Past Medical History  Diagnosis Date  . Adenomatous colon polyp   . Insomnia   . RLS (restless legs syndrome)   . Colon polyp   . Incontinence   . Hepatitis B, chronic   . H/O: rheumatic fever   . Hypertension   . Hyperlipidemia   . COPD (chronic obstructive pulmonary disease)   . Unspecified disorders of nervous system   . Bipolar affective disorder     h/o  . Obstructive sleep apnea   . H/O:  CVA (cardiovascular accident)   . Fatty liver 04/09/08    found in abd CT   Past Surgical History  Procedure Date  . Back surgery   . Cholecystectomy   . Rotator cuff repair   . Abdominal hysterectomy     total  . Lumbar fusion 10/09  . Hemorroidectomy     and colon polyp removed   History  Substance Use Topics  . Smoking status: Current Everyday Smoker -- 0.5 packs/day  . Smokeless tobacco: Not on file  . Alcohol Use: No   Family History  Problem Relation Age of Onset  . Colon cancer Neg Hx   . Breast cancer Mother   . Lung cancer Mother   . Stomach cancer Father   . Diabetes      4 aunts, and 1 uncle   Allergies not on file Current Outpatient Prescriptions on File Prior to Visit  Medication Sig Dispense Refill  . ARIPiprazole (ABILIFY) 30 MG tablet Take 30 mg by mouth daily.        . clonazePAM (KLONOPIN) 1 MG tablet Take 1 mg by mouth 2 (two) times daily as needed.        . cyclobenzaprine (FLEXERIL) 10 MG tablet Take one tablet by mouth twice daily as needed for back pain  90 tablet  0  . docusate sodium (COLACE) 100 MG capsule Take 100 mg by mouth 2 (two) times daily as needed.        Marland Kitchen estradiol (VIVELLE-DOT) 0.1 MG/24HR Place 1 patch onto the skin 2 (two) times a week.        Marland Kitchen HYDROcodone-acetaminophen (NORCO) 10-325 MG per tablet Take 1 tablet by mouth. Every 6-8 hours as needed for pain       . lamoTRIgine (LAMICTAL) 100 MG tablet Take 3 tablets by mouth once daily       . nabumetone (RELAFEN) 750 MG tablet Take 750 mg by mouth 2 (two) times daily.        Marland Kitchen nystatin (MYCOSTATIN) powder Apply topically 2 (two) times daily.        Marland Kitchen omeprazole (PRILOSEC) 40 MG capsule Take 40 mg by mouth daily.        . promethazine (PHENERGAN) 25 MG tablet Take 1/2-1 by mouth every 8 hours as needed for nausea       . QUEtiapine (SEROQUEL) 300 MG tablet Take 300 mg by mouth 3 (three) times daily.        . ropinirole (REQUIP) 5 MG tablet Take 5 mg by mouth at bedtime.        Marland Kitchen  zolpidem (AMBIEN) 10 MG tablet Take 10 mg by mouth at bedtime as needed.         The PMH, PSH, Social History, Family History, Medications, and allergies have been reviewed in [facility], and have been updated if relevant.   Review of Systems       See HPI General:  Denies malaise. Eyes:  Denies blurring. ENT:  Denies difficulty swallowing. CV:  Denies chest pain or discomfort. Resp:  Denies shortness of breath. GI:  Denies abdominal pain, bloody stools, and change in bowel habits. GU:  Denies abnormal vaginal bleeding. MS:  Denies joint pain, joint redness, and joint swelling. Derm:  Denies rash. Neuro:  Denies headaches. Psych:  Denies mental problems, panic attacks, sense of great danger, suicidal thoughts/plans, thoughts of violence, unusual visions or sounds, and thoughts /plans of harming others. Endo:  Denies cold intolerance and heat intolerance. Heme:  Denies abnormal bruising and bleeding.  Physical Exam BP 122/80  Pulse 87  Temp(Src) 98 F (36.7 C) (Oral)  Ht 5' 3"  (1.6 m)  Wt 190 lb (86.183 kg)  BMI 33.66 kg/m2  General:  alert, well-developed, well-nourished, and well-hydrated.   Head:  normocephalic and atraumatic.   Eyes:  vision grossly intact and pupils equal.   Ears:  R ear normal and L ear normal.   Nose:  no external deformity.   Mouth:  good dentition.   Neck:  No deformities, masses, or tenderness noted. Breasts:  No mass, nodules, thickening, tenderness, bulging, retraction, inflamation, nipple discharge or skin changes noted.   Lungs:  normal respiratory effort, no intercostal retractions, and no crackles.   Heart:  Normal rate and regular rhythm. S1 and S2 normal without gallop, murmur, click, rub or other extra sounds. Abdomen:  soft, non-tender, normal bowel sounds, no distention, no masses, and no guarding.   Msk:  No deformity or scoliosis noted of thoracic or lumbar spine.   Extremities:  no edema Neurologic:  alert & oriented X3.   Skin:  Intact  without suspicious lesions or rashes Psych:  Cognition and judgment appear intact. Alert and cooperative with normal attention span and concentration. No apparent delusions, illusions, hallucinations  Assessment and Plan: 1. Routine general medical examination  at a health care facility  The patients weight, height, BMI and visual acuity have been recorded in the chart I have made referrals, counseling and provided education to the patient based review of the above and I have provided the pt with a written personalized care plan for preventive services.  Zostavax given today. IFOB ordered today.     2. Hyperlipidemia Deteriorated. Discussed eat right diet.    3. HEPATITIS B, CHRONIC  Hepatic function panel  4 Hyperglycemia  Deteriorated. Start Metformin 500 mg daily. Lab Results  Component Value Date   CREATININE 1.0 12/07/2010

## 2010-12-13 NOTE — Patient Instructions (Signed)
Please stop by to see Stephanie [name] on your way out to set up your mammogram.

## 2010-12-17 ENCOUNTER — Other Ambulatory Visit: Payer: Medicare Other

## 2010-12-21 ENCOUNTER — Other Ambulatory Visit: Payer: Self-pay | Admitting: Family Medicine

## 2010-12-21 ENCOUNTER — Encounter: Payer: Self-pay | Admitting: *Deleted

## 2010-12-21 ENCOUNTER — Other Ambulatory Visit: Payer: Self-pay | Admitting: *Deleted

## 2010-12-21 DIAGNOSIS — Z1211 Encounter for screening for malignant neoplasm of colon: Secondary | ICD-10-CM

## 2010-12-21 LAB — FECAL OCCULT BLOOD, IMMUNOCHEMICAL: Fecal Occult Bld: NEGATIVE

## 2010-12-21 MED ORDER — CYCLOBENZAPRINE HCL 10 MG PO TABS: ORAL_TABLET | ORAL | Status: DC

## 2010-12-21 NOTE — Telephone Encounter (Signed)
Fax from [place] is on your desk.

## 2010-12-22 MED ORDER — CYCLOBENZAPRINE HCL 10 MG PO TABS: ORAL_TABLET | ORAL | Status: DC

## 2010-12-22 NOTE — Telephone Encounter (Signed)
Rx sent electronically.  

## 2010-12-30 ENCOUNTER — Other Ambulatory Visit: Payer: Self-pay | Admitting: *Deleted

## 2010-12-30 MED ORDER — DOCUSATE SODIUM 100 MG PO CAPS: 100.0000 mg | ORAL_CAPSULE | Freq: Two times a day (BID) | ORAL | Status: DC | PRN

## 2011-01-06 ENCOUNTER — Ambulatory Visit: Payer: Self-pay | Admitting: Family Medicine

## 2011-01-10 ENCOUNTER — Encounter: Payer: Self-pay | Admitting: Family Medicine

## 2011-01-10 ENCOUNTER — Encounter: Payer: Self-pay | Admitting: *Deleted

## 2011-01-14 ENCOUNTER — Telehealth: Payer: Self-pay | Admitting: Pulmonary Disease

## 2011-01-14 NOTE — Telephone Encounter (Signed)
I spoke with the pt and she has not seen [facility] since 09-14-09. She was asking for an inhaler, but did not know the name of it and there was not one on her med list. I advised the pt best to set an appt to see VS since it has been over a year. Pt set for an appt. [place] Bing, CMA

## 2011-01-17 ENCOUNTER — Ambulatory Visit (INDEPENDENT_AMBULATORY_CARE_PROVIDER_SITE_OTHER): Payer: Medicare Other | Admitting: Family Medicine

## 2011-01-17 ENCOUNTER — Ambulatory Visit: Payer: Self-pay | Admitting: Family Medicine

## 2011-01-17 ENCOUNTER — Encounter: Payer: Self-pay | Admitting: Family Medicine

## 2011-01-17 ENCOUNTER — Telehealth: Payer: Self-pay | Admitting: *Deleted

## 2011-01-17 VITALS — BP 110/78 | HR 82 | Temp 98.2°F | Wt 190.5 lb

## 2011-01-17 DIAGNOSIS — R2240 Localized swelling, mass and lump, unspecified lower limb: Secondary | ICD-10-CM

## 2011-01-17 DIAGNOSIS — R229 Localized swelling, mass and lump, unspecified: Secondary | ICD-10-CM

## 2011-01-17 MED ORDER — CEPHALEXIN 500 MG PO CAPS: 500.0000 mg | ORAL_CAPSULE | Freq: Four times a day (QID) | ORAL | Status: AC

## 2011-01-17 NOTE — Telephone Encounter (Signed)
Patient was notified of doppler results. She is asking what you think it could possibly be since it was neg. Please advise.

## 2011-01-17 NOTE — Progress Notes (Signed)
Subjective:    Patient ID: Stephanie [name], female    DOB: 09/14/1950, 60 y.o.   MRN: [id number]  HPI  60 yo here for acute onset of right lower leg edema, erythema and pain. Started about a week ago, getting progressively worse.  No recent travel.  No h/o trauma to leg.  Very sedentary due to chronic pain and other issues.  No CP or SOB. No fevers, chills or malaise but does admit that redness on right lower medial leg is worsening last few days with warmth.   Patient Active Problem List  Diagnoses  . HEPATITIS B, CHRONIC  . Benign Neoplasm of Colon  . UNSPECIFIED VITAMIN D DEFICIENCY  . HYPERLIPIDEMIA  . TOBACCO ABUSE  . OBSTRUCTIVE SLEEP APNEA  . RESTLESS LEGS SYNDROME  . DISORDER, NERVOUS SYSTEM NOS  . RHEUMATIC FEVER  . HYPERTENSION  . RHINITIS  . COPD  . SMALL BOWEL OBSTRUCTION  . OTHER CONSTIPATION  . CELLULITIS AND ABSCESS OF OTHER SPECIFIED SITE  . FOLLICULITIS  . SKIN LESION  . HIP PAIN  . BACK PAIN, LUMBAR, CHRONIC  . SCIATICA, LEFT  . INSOMNIA  . EDEMA, LOCALIZED  . NAUSEA ALONE  . INCONTINENCE  . ABDOMINAL PAIN  . PELVIC PAIN, ACUTE  . MAMMOGRAM, ABNORMAL, RIGHT  . UNS ADVRS EFF OTH RX MEDICINAL&BIOLOGICAL SBSTNC  . BIPOLAR AFFECTIVE DISORDER, HX OF  . PERSONAL HISTORY OF COLONIC POLYPS  . DYSURIA  . INTERTRIGO, CANDIDAL  . RIB PAIN, RIGHT SIDED  . Routine general medical examination at a health care facility  . Hyperglycemia  . Localized swelling, mass, or lump of lower extremity   Past Medical History  Diagnosis Date  . Adenomatous colon polyp   . Insomnia   . RLS (restless legs syndrome)   . Colon polyp   . Incontinence   . Hepatitis B, chronic   . H/O: rheumatic fever   . Hypertension   . Hyperlipidemia   . COPD (chronic obstructive pulmonary disease)   . Unspecified disorders of nervous system   . Bipolar affective disorder     h/o  . Obstructive sleep apnea   . H/O: CVA (cardiovascular accident)   . Fatty liver 04/09/08   found in abd CT   Past Surgical History  Procedure Date  . Back surgery   . Cholecystectomy   . Rotator cuff repair   . Abdominal hysterectomy     total  . Lumbar fusion 10/09  . Hemorroidectomy     and colon polyp removed   History  Substance Use Topics  . Smoking status: Current Everyday Smoker -- 0.5 packs/day  . Smokeless tobacco: Not on file  . Alcohol Use: No   Family History  Problem Relation Age of Onset  . Colon cancer Neg Hx   . Breast cancer Mother   . Lung cancer Mother   . Stomach cancer Father   . Diabetes      4 aunts, and 1 uncle   No Known Allergies Current Outpatient Prescriptions on File Prior to Visit  Medication Sig Dispense Refill  . ARIPiprazole (ABILIFY) 30 MG tablet Take 30 mg by mouth daily.        . clonazePAM (KLONOPIN) 1 MG tablet Take 1 mg by mouth 2 (two) times daily as needed.        . cyclobenzaprine (FLEXERIL) 10 MG tablet Take one tablet by mouth twice daily as needed for back pain  90 tablet  0  . docusate sodium (COLACE) 100  MG capsule Take 1 capsule (100 mg total) by mouth 2 (two) times daily as needed.  60 capsule  6  . estradiol (VIVELLE-DOT) 0.1 MG/24HR Place 1 patch onto the skin 2 (two) times a week.        Marland Kitchen HYDROcodone-acetaminophen (NORCO) 10-325 MG per tablet Take 1 tablet by mouth. Every 6-8 hours as needed for pain       . lamoTRIgine (LAMICTAL) 100 MG tablet Take 3 tablets by mouth once daily       . metFORMIN (GLUCOPHAGE-XR) 500 MG 24 hr tablet Take 1 tablet (500 mg total) by mouth daily with breakfast.  30 tablet  3  . morphine (MSIR) 15 MG tablet Take 15 mg by mouth every 6 (six) hours as needed.        . nabumetone (RELAFEN) 750 MG tablet Take 750 mg by mouth 2 (two) times daily.        . naproxen (NAPROSYN) 500 MG tablet Take 500 mg by mouth 2 (two) times daily with a meal.        . nystatin (MYCOSTATIN) powder Apply topically 2 (two) times daily.  15 g  0  . omeprazole (PRILOSEC) 40 MG capsule Take 40 mg by mouth  daily.        . promethazine (PHENERGAN) 25 MG tablet Take 1/2-1 by mouth every 8 hours as needed for nausea       . QUEtiapine (SEROQUEL) 300 MG tablet Take 300 mg by mouth 3 (three) times daily.        . ropinirole (REQUIP) 5 MG tablet Take 5 mg by mouth at bedtime.        Marland Kitchen zolpidem (AMBIEN) 10 MG tablet Take 10 mg by mouth at bedtime as needed.         The PMH, PSH, Social History, Family History, Medications, and allergies have been reviewed in [facility], and have been updated if relevant.   Review of Systems SEE HPI  No nausea, vomiting or diarrhea.    Objective:   Physical Exam BP 110/78  Pulse 100  Temp 98.2 F (36.8 C)  Wt 190 lb 8 oz (86.41 kg) Gen:  Alert, pleasant, NAD Resp:  CTA bilaterally CVS:  RRR Ext:  Right leg:  1+ pitting edema right foot, ankle, large area of erythema, right medial lower leg- TTP, warm to touch Left leg:  No edema       Assessment & Plan:   1. Localized swelling, mass, or lump of lower extremity  US Venous Img Lower Unilateral Right   New.  Very concerned for DVT vs phlebitis. Will get [name] of right lower leg. Also place on Keflex for probable cellulitis. The patient indicates understanding of these issues and agrees with the plan. Currently no other red flag symptoms. Will not place on ASA at this time as she is on multiple other medications including Naprosyn, morphine that could interact.

## 2011-01-17 NOTE — Telephone Encounter (Signed)
Hopefully it is a cellulitis/phlebitis- inflammation of superficial blood vessels and overlying skin.  If still having symptoms after she has had abx for a few days, please call us back.

## 2011-01-17 NOTE — Telephone Encounter (Signed)
Patient notified

## 2011-01-25 ENCOUNTER — Ambulatory Visit: Payer: Medicare Other | Admitting: Pulmonary Disease

## 2011-01-25 ENCOUNTER — Encounter: Payer: Self-pay | Admitting: Family Medicine

## 2011-01-25 ENCOUNTER — Ambulatory Visit (INDEPENDENT_AMBULATORY_CARE_PROVIDER_SITE_OTHER): Payer: Medicare Other | Admitting: Family Medicine

## 2011-01-25 VITALS — BP 128/76 | HR 102 | Temp 97.7°F | Wt 190.5 lb

## 2011-01-25 DIAGNOSIS — R5381 Other malaise: Secondary | ICD-10-CM

## 2011-01-25 DIAGNOSIS — R2240 Localized swelling, mass and lump, unspecified lower limb: Secondary | ICD-10-CM

## 2011-01-25 DIAGNOSIS — R5383 Other fatigue: Secondary | ICD-10-CM

## 2011-01-25 DIAGNOSIS — R229 Localized swelling, mass and lump, unspecified: Secondary | ICD-10-CM

## 2011-01-25 LAB — CBC WITH DIFFERENTIAL/PLATELET
Basophils Absolute: 0 10*3/uL (ref 0.0–0.1)
Eosinophils Relative: 1.2 % (ref 0.0–5.0)
HCT: 36.4 % (ref 36.0–46.0)
Hemoglobin: 12.3 g/dL (ref 12.0–15.0)
Lymphocytes Relative: 23 % (ref 12.0–46.0)
Lymphs Abs: 1.5 10*3/uL (ref 0.7–4.0)
Monocytes Relative: 8.7 % (ref 3.0–12.0)
Neutro Abs: 4.3 10*3/uL (ref 1.4–7.7)
RDW: 14.3 % (ref 11.5–14.6)
WBC: 6.4 10*3/uL (ref 4.5–10.5)

## 2011-01-25 LAB — BASIC METABOLIC PANEL
CO2: 30 mEq/L (ref 19–32)
Calcium: 9.2 mg/dL (ref 8.4–10.5)
Chloride: 106 mEq/L (ref 96–112)
Creatinine, Ser: 0.9 mg/dL (ref 0.4–1.2)
Glucose, Bld: 140 mg/dL — ABNORMAL HIGH (ref 70–99)
Sodium: 142 mEq/L (ref 135–145)

## 2011-01-25 LAB — TSH: TSH: 3.28 u[IU]/mL (ref 0.35–5.50)

## 2011-01-25 NOTE — Progress Notes (Signed)
Subjective:    Patient ID: Stephanie [name], female    DOB: [birth year], 60 y.o.   MRN: [id number]  HPI  60 yo here for persisetent right lower leg edema, erythema and pain. Initially saw her on 8/16- concern for DVT. LE doppler neg. Placed on Keflex. Erythema has completely resolved, pain is gone. Still having some edema.  No recent travel.  No h/o trauma to leg.  Very sedentary due to chronic pain and other issues.  No CP or SOB. No fevers, chills or malaise but does admit that redness on right lower medial leg is worsening last few days with warmth.  Patient Active Problem List  Diagnoses  . HEPATITIS B, CHRONIC  . Benign Neoplasm of Colon  . UNSPECIFIED VITAMIN D DEFICIENCY  . HYPERLIPIDEMIA  . TOBACCO ABUSE  . OBSTRUCTIVE SLEEP APNEA  . RESTLESS LEGS SYNDROME  . DISORDER, NERVOUS SYSTEM NOS  . RHEUMATIC FEVER  . HYPERTENSION  . RHINITIS  . COPD  . SMALL BOWEL OBSTRUCTION  . OTHER CONSTIPATION  . CELLULITIS AND ABSCESS OF OTHER SPECIFIED SITE  . FOLLICULITIS  . SKIN LESION  . HIP PAIN  . BACK PAIN, LUMBAR, CHRONIC  . SCIATICA, LEFT  . INSOMNIA  . EDEMA, LOCALIZED  . NAUSEA ALONE  . INCONTINENCE  . ABDOMINAL PAIN  . PELVIC PAIN, ACUTE  . MAMMOGRAM, ABNORMAL, RIGHT  . UNS ADVRS EFF OTH RX MEDICINAL&BIOLOGICAL SBSTNC  . BIPOLAR AFFECTIVE DISORDER, HX OF  . PERSONAL HISTORY OF COLONIC POLYPS  . DYSURIA  . INTERTRIGO, CANDIDAL  . RIB PAIN, RIGHT SIDED  . Routine general medical examination at a health care facility  . Hyperglycemia  . Localized swelling, mass, or lump of lower extremity   Past Medical History  Diagnosis Date  . Adenomatous colon polyp   . Insomnia   . RLS (restless legs syndrome)   . Colon polyp   . Incontinence   . Hepatitis B, chronic   . H/O: rheumatic fever   . Hypertension   . Hyperlipidemia   . COPD (chronic obstructive pulmonary disease)   . Unspecified disorders of nervous system   . Bipolar affective disorder     h/o  .  Obstructive sleep apnea   . H/O: CVA (cardiovascular accident)   . Fatty liver 04/09/08    found in abd CT   Past Surgical History  Procedure Date  . Back surgery   . Cholecystectomy   . Rotator cuff repair   . Abdominal hysterectomy     total  . Lumbar fusion 10/09  . Hemorroidectomy     and colon polyp removed   History  Substance Use Topics  . Smoking status: Current Everyday Smoker -- 0.5 packs/day  . Smokeless tobacco: Not on file  . Alcohol Use: No   Family History  Problem Relation Age of Onset  . Colon cancer Neg Hx   . Breast cancer Mother   . Lung cancer Mother   . Stomach cancer Father   . Diabetes      4 aunts, and 1 uncle   No Known Allergies Current Outpatient Prescriptions on File Prior to Visit  Medication Sig Dispense Refill  . ARIPiprazole (ABILIFY) 30 MG tablet Take 30 mg by mouth daily.        . cephALEXin (KEFLEX) 500 MG capsule Take 1 capsule (500 mg total) by mouth 4 (four) times daily.  28 capsule  0  . clonazePAM (KLONOPIN) 1 MG tablet Take 1 mg by mouth 2 (two)  times daily as needed.        . cyclobenzaprine (FLEXERIL) 10 MG tablet Take one tablet by mouth twice daily as needed for back pain  90 tablet  0  . docusate sodium (COLACE) 100 MG capsule Take 1 capsule (100 mg total) by mouth 2 (two) times daily as needed.  60 capsule  6  . estradiol (VIVELLE-DOT) 0.1 MG/24HR Place 1 patch onto the skin 2 (two) times a week.        Marland Kitchen HYDROcodone-acetaminophen (NORCO) 10-325 MG per tablet Take 1 tablet by mouth. Every 6-8 hours as needed for pain       . lamoTRIgine (LAMICTAL) 100 MG tablet Take 3 tablets by mouth once daily       . metFORMIN (GLUCOPHAGE-XR) 500 MG 24 hr tablet Take 1 tablet (500 mg total) by mouth daily with breakfast.  30 tablet  3  . morphine (MSIR) 15 MG tablet Take 15 mg by mouth every 6 (six) hours as needed.        . nabumetone (RELAFEN) 750 MG tablet Take 750 mg by mouth 2 (two) times daily.        . naproxen (NAPROSYN) 500 MG  tablet Take 500 mg by mouth 2 (two) times daily with a meal.        . nystatin (MYCOSTATIN) powder Apply topically 2 (two) times daily.  15 g  0  . omeprazole (PRILOSEC) 40 MG capsule Take 40 mg by mouth daily.        . promethazine (PHENERGAN) 25 MG tablet Take 1/2-1 by mouth every 8 hours as needed for nausea       . QUEtiapine (SEROQUEL) 300 MG tablet Take 300 mg by mouth 3 (three) times daily.        . ropinirole (REQUIP) 5 MG tablet Take 5 mg by mouth at bedtime.        Marland Kitchen zolpidem (AMBIEN) 10 MG tablet Take 10 mg by mouth at bedtime as needed.         The PMH, PSH, Social History, Family History, Medications, and allergies have been reviewed in [facility], and have been updated if relevant.  Review of Systems SEE HPI  No nausea, vomiting or diarrhea.    Objective:   Physical Exam BP 128/76  Pulse 102  Temp(Src) 97.7 F (36.5 C) (Oral)  Wt 190 lb 8 oz (86.41 kg) Gen:  Alert, pleasant, NAD Resp:  CTA bilaterally CVS:  RRR Ext:  Right leg:  1+ pitting edema right foot, ankle edema and erythema/warmth resolved   Assessment & Plan:   1. Localized swelling, mass, or lump of lower extremity   Improved s/p keflex. Cellulitis resolved with some persistent edema. Likely will continue to improve but will check labs today to rule out other possible contributing factors. The patient indicates understanding of these issues and agrees with the plan.  Orders Placed This Encounter  Procedures  . TSH  . CBC w/Diff  . Basic Metabolic Panel (BMET)

## 2011-01-31 ENCOUNTER — Telehealth: Payer: Self-pay | Admitting: *Deleted

## 2011-01-31 NOTE — Telephone Encounter (Signed)
Called pt, she needed a copy of her last physical to take to [facility]. Will print out for her. Please leave up front for her to pick up tomorrow.

## 2011-01-31 NOTE — Telephone Encounter (Signed)
Patient called and requested a copy of her last physical notes to carry to another doctor with her. Please call and let her know if she can get a copy?

## 2011-01-31 NOTE — Telephone Encounter (Signed)
Notes left at front desk for patient to pick up.

## 2011-02-23 ENCOUNTER — Telehealth: Payer: Self-pay | Admitting: *Deleted

## 2011-02-23 NOTE — Telephone Encounter (Signed)
Pt has brought in form from [facility] for surgical clearance for spinal cord stimulation.  Form is on your desk.  No EKG in chart.

## 2011-02-23 NOTE — Telephone Encounter (Signed)
Needs office visit for EKG

## 2011-02-23 NOTE — Telephone Encounter (Signed)
Called pt, she has already been contacted and appt scheduled.

## 2011-02-25 ENCOUNTER — Encounter: Payer: Self-pay | Admitting: Family Medicine

## 2011-02-25 ENCOUNTER — Ambulatory Visit (INDEPENDENT_AMBULATORY_CARE_PROVIDER_SITE_OTHER): Payer: Medicare Other | Admitting: Family Medicine

## 2011-02-25 VITALS — BP 112/86 | HR 92 | Temp 97.9°F | Wt 185.2 lb

## 2011-02-25 DIAGNOSIS — Z01818 Encounter for other preprocedural examination: Secondary | ICD-10-CM

## 2011-02-25 DIAGNOSIS — J449 Chronic obstructive pulmonary disease, unspecified: Secondary | ICD-10-CM

## 2011-02-25 DIAGNOSIS — F17200 Nicotine dependence, unspecified, uncomplicated: Secondary | ICD-10-CM

## 2011-02-25 DIAGNOSIS — E119 Type 2 diabetes mellitus without complications: Secondary | ICD-10-CM

## 2011-02-25 NOTE — Progress Notes (Signed)
Subjective:    Stephanie [name] is a 60 y.o. female who presents to the office today for a preoperative consultation at the request of surgeon Dr. Rolena Infante who plans on performing spinal cord stimulator placement on once she is cleared for surgery . This consultation is requested for the specific conditions prompting preoperative evaluation (i.e. because of potential affect on operative risk): Pt is a smoker, has COPD. Planned anesthesia: general. The patient has the following known anesthesia issues: none, has had multiple surgeries without any complications of anesthesia. Patients bleeding risk: no recent abnormal bleeding. Patient does not have objections to receiving blood products if needed.  Patient Active Problem List  Diagnoses  . HEPATITIS B, CHRONIC  . Benign Neoplasm of Colon  . UNSPECIFIED VITAMIN D DEFICIENCY  . HYPERLIPIDEMIA  . TOBACCO ABUSE  . OBSTRUCTIVE SLEEP APNEA  . RESTLESS LEGS SYNDROME  . DISORDER, NERVOUS SYSTEM NOS  . RHEUMATIC FEVER  . HYPERTENSION  . RHINITIS  . COPD  . SMALL BOWEL OBSTRUCTION  . OTHER CONSTIPATION  . CELLULITIS AND ABSCESS OF OTHER SPECIFIED SITE  . FOLLICULITIS  . SKIN LESION  . HIP PAIN  . BACK PAIN, LUMBAR, CHRONIC  . SCIATICA, LEFT  . INSOMNIA  . EDEMA, LOCALIZED  . NAUSEA ALONE  . INCONTINENCE  . ABDOMINAL PAIN  . PELVIC PAIN, ACUTE  . MAMMOGRAM, ABNORMAL, RIGHT  . UNS ADVRS EFF OTH RX MEDICINAL&BIOLOGICAL SBSTNC  . BIPOLAR AFFECTIVE DISORDER, HX OF  . PERSONAL HISTORY OF COLONIC POLYPS  . DYSURIA  . INTERTRIGO, CANDIDAL  . RIB PAIN, RIGHT SIDED  . Routine general medical examination at a health care facility  . Hyperglycemia  . Localized swelling, mass, or lump of lower extremity  . Preop examination   Past Medical History  Diagnosis Date  . Adenomatous colon polyp   . Insomnia   . RLS (restless legs syndrome)   . Colon polyp   . Incontinence   . Hepatitis B, chronic   . H/O: rheumatic fever   . Hypertension   .  Hyperlipidemia   . COPD (chronic obstructive pulmonary disease)   . Unspecified disorders of nervous system   . Bipolar affective disorder     h/o  . Obstructive sleep apnea   . H/O: CVA (cardiovascular accident)   . Fatty liver 04/09/08    found in abd CT   Past Surgical History  Procedure Date  . Back surgery   . Cholecystectomy   . Rotator cuff repair   . Abdominal hysterectomy     total  . Lumbar fusion 10/09  . Hemorroidectomy     and colon polyp removed   History  Substance Use Topics  . Smoking status: Current Everyday Smoker -- 0.5 packs/day  . Smokeless tobacco: Not on file  . Alcohol Use: No   Family History  Problem Relation Age of Onset  . Colon cancer Neg Hx   . Breast cancer Mother   . Lung cancer Mother   . Stomach cancer Father   . Diabetes      4 aunts, and 1 uncle   No Known Allergies Current Outpatient Prescriptions on File Prior to Visit  Medication Sig Dispense Refill  . ARIPiprazole (ABILIFY) 30 MG tablet Take 30 mg by mouth daily.        . clonazePAM (KLONOPIN) 1 MG tablet Take 1 mg by mouth 2 (two) times daily as needed.        . cyclobenzaprine (FLEXERIL) 10 MG tablet Take one  tablet by mouth twice daily as needed for back pain  90 tablet  0  . docusate sodium (COLACE) 100 MG capsule Take 1 capsule (100 mg total) by mouth 2 (two) times daily as needed.  60 capsule  6  . estradiol (VIVELLE-DOT) 0.1 MG/24HR Place 1 patch onto the skin 2 (two) times a week.        . lamoTRIgine (LAMICTAL) 100 MG tablet Take 3 tablets by mouth once daily       . metFORMIN (GLUCOPHAGE-XR) 500 MG 24 hr tablet Take 1 tablet (500 mg total) by mouth daily with breakfast.  30 tablet  3  . morphine (MSIR) 15 MG tablet Take 15 mg by mouth every 6 (six) hours as needed.        . nabumetone (RELAFEN) 750 MG tablet Take 750 mg by mouth 2 (two) times daily.        Marland Kitchen omeprazole (PRILOSEC) 40 MG capsule Take 40 mg by mouth daily.        . promethazine (PHENERGAN) 25 MG tablet  Take 1/2-1 by mouth every 8 hours as needed for nausea       . QUEtiapine (SEROQUEL) 300 MG tablet Take 300 mg by mouth 3 (three) times daily.        . ropinirole (REQUIP) 5 MG tablet Take 5 mg by mouth at bedtime.        Marland Kitchen zolpidem (AMBIEN) 10 MG tablet Take 10 mg by mouth at bedtime as needed.        Marland Kitchen HYDROcodone-acetaminophen (NORCO) 10-325 MG per tablet Take 1 tablet by mouth. Every 6-8 hours as needed for pain       . naproxen (NAPROSYN) 500 MG tablet Take 500 mg by mouth 2 (two) times daily with a meal.        . nystatin (MYCOSTATIN) powder Apply topically 2 (two) times daily.  15 g  0   The PMH, PSH, Social History, Family History, Medications, and allergies have been reviewed in [facility], and have been updated if relevant.   Review of Systems See HPI   Objective:  BP 112/86  Pulse 92  Temp(Src) 97.9 F (36.6 C) (Oral)  Wt 185 lb 4 oz (84.029 kg)  General: obese.  Nose: no sinus tenderness  Mouth: no oral lesions  Neck: no JVD.  Lungs: diminished breath sounds, no wheezing or rales  Heart: regular rhythm, normal rate, and no murmurs.  Extremities: no clubbing, cyanosis, edema, or deformity noted  Cervical Nodes: no significant adenopathy     Cardiographics ECG: no change since previous ECG dated 06-25-2006, non specific ST changes    Assessment:      60 y.o. female with planned surgery as above.   Known risk factors for perioperative complications: None COPD, tobacco abuse, DM.  Difficulty with intubation is not anticipated.  Cardiac Risk Estimation:  Low, EKG shows no significant changed from prior.   Plan:    1. Preoperative workup as follows ECG. Proceed with surgery, form filled out. Recommend watching post operative blood sugars, heart and lung function closely given h/o COPD and tobacco abuse. Hold NSAIDs 14 days prior.

## 2011-02-25 NOTE — Progress Notes (Signed)
OV note, EKG from today and one from 2008 faxed to [facility] at [phone], [place] Char.

## 2011-02-28 ENCOUNTER — Ambulatory Visit: Payer: Medicare Other | Admitting: Family Medicine

## 2011-03-03 ENCOUNTER — Telehealth: Payer: Self-pay | Admitting: *Deleted

## 2011-03-03 NOTE — Telephone Encounter (Signed)
Pt states she has upcoming surgery and asks if metformin has any blood thinner in it.  Advised no.

## 2011-03-04 NOTE — Telephone Encounter (Signed)
I advised the patient to be sure and let her surgeon know of meds that she takes.  Pt agreed.

## 2011-03-04 NOTE — Telephone Encounter (Signed)
Agreed it is not a blood thinner. They may hold it because of how it can impact the kidneys or they may continue it.   Keep [name] posted and good luck with surgery.

## 2011-03-09 ENCOUNTER — Encounter (HOSPITAL_COMMUNITY)
Admission: RE | Admit: 2011-03-09 | Discharge: 2011-03-09 | Disposition: A | Discharge status: Home / Self Care | Payer: Medicare Other | Source: Ambulatory Visit | Attending: Orthopedic Surgery | Admitting: Orthopedic Surgery

## 2011-03-09 ENCOUNTER — Other Ambulatory Visit (HOSPITAL_COMMUNITY): Payer: Self-pay | Admitting: Orthopedic Surgery

## 2011-03-09 DIAGNOSIS — Z01811 Encounter for preprocedural respiratory examination: Secondary | ICD-10-CM

## 2011-03-09 LAB — COMPREHENSIVE METABOLIC PANEL
ALT: 18 U/L (ref 0–35)
BUN: 13 mg/dL (ref 6–23)
CO2: 29 mEq/L (ref 19–32)
Calcium: 9.8 mg/dL (ref 8.4–10.5)
Creatinine, Ser: 0.79 mg/dL (ref 0.50–1.10)
GFR calc Af Amer: >90 mL/min (ref >90)
GFR calc non Af Amer: 89 mL/min — ABNORMAL LOW (ref >90)
Glucose, Bld: 61 mg/dL — ABNORMAL LOW (ref 70–99)
Sodium: 141 mEq/L (ref 135–145)

## 2011-03-09 LAB — CBC
HCT: 39.2 % (ref 36.0–46.0)
Hemoglobin: 13.4 g/dL (ref 12.0–15.0)
MCH: 31.3 pg (ref 26.0–34.0)
MCV: 91.6 fL (ref 78.0–100.0)
RBC: 4.28 MIL/uL (ref 3.87–5.11)

## 2011-03-09 LAB — SURGICAL PCR SCREEN: MRSA, PCR: NEGATIVE

## 2011-03-15 ENCOUNTER — Telehealth: Payer: Self-pay | Admitting: Pulmonary Disease

## 2011-03-15 NOTE — Telephone Encounter (Signed)
Spoke with pt. She states needs rx for inhaler "the one that starts with an H". I advised according to our records no inhaler was prescribed for her in the past and pulm f/u would be prn. It has been over a year since she was seen here. I advised that she should contact her PCP who has been following up with her more frequently. Pt verbalized understanding and is okay with this plan.

## 2011-03-15 NOTE — Telephone Encounter (Signed)
ATC, Line busy, WCB 

## 2011-03-16 ENCOUNTER — Ambulatory Visit (HOSPITAL_COMMUNITY)
Admission: RE | Admit: 2011-03-16 | Discharge: 2011-03-18 | Disposition: A | Discharge status: Home / Self Care | Payer: Medicare Other | Source: Ambulatory Visit | Attending: Orthopedic Surgery | Admitting: Orthopedic Surgery

## 2011-03-16 ENCOUNTER — Ambulatory Visit (HOSPITAL_COMMUNITY): Payer: Medicare Other

## 2011-03-16 ENCOUNTER — Inpatient Hospital Stay (HOSPITAL_COMMUNITY)
Admission: AD | Admit: 2011-03-16 | Payer: Medicare Other | Source: Other Acute Inpatient Hospital | Admitting: Internal Medicine

## 2011-03-16 DIAGNOSIS — M549 Dorsalgia, unspecified: Secondary | ICD-10-CM | POA: Insufficient documentation

## 2011-03-16 DIAGNOSIS — J4489 Other specified chronic obstructive pulmonary disease: Secondary | ICD-10-CM | POA: Insufficient documentation

## 2011-03-16 DIAGNOSIS — J449 Chronic obstructive pulmonary disease, unspecified: Secondary | ICD-10-CM | POA: Insufficient documentation

## 2011-03-16 DIAGNOSIS — G8929 Other chronic pain: Secondary | ICD-10-CM | POA: Insufficient documentation

## 2011-03-16 DIAGNOSIS — Z01812 Encounter for preprocedural laboratory examination: Secondary | ICD-10-CM | POA: Insufficient documentation

## 2011-03-16 DIAGNOSIS — I10 Essential (primary) hypertension: Secondary | ICD-10-CM | POA: Insufficient documentation

## 2011-03-16 DIAGNOSIS — Z01811 Encounter for preprocedural respiratory examination: Secondary | ICD-10-CM | POA: Insufficient documentation

## 2011-03-16 DIAGNOSIS — E669 Obesity, unspecified: Secondary | ICD-10-CM | POA: Insufficient documentation

## 2011-03-16 LAB — GLUCOSE, CAPILLARY: Glucose-Capillary: 110 mg/dL — ABNORMAL HIGH (ref 70–99)

## 2011-03-18 LAB — GLUCOSE, CAPILLARY

## 2011-03-22 NOTE — Op Note (Signed)
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  OPERATIVE PROCEDURES:  Implantation of St. Jude/autonomic nervous system spinal cord stimulator lead.  The system uses a tripole lead with a standard rechargeable battery on the left side.  FIRST ASSISTANT:  Carron Curie, PA.  COMPLICATIONS:  None.  HISTORY:  This is a very pleasant 60 year old woman who has had chronic debilitating back and bilateral back pain.  She had a successful trial implantation and so was presented to me for permanent implantation.  All appropriate risks, benefits, and alternatives were discussed.  Consent was obtained.  OPERATIVE NOTE:  The patient was brought to the operating room, placed supine on the operating table.  After successful induction of general anesthesia, endotracheal intubation, she was turned prone onto a Wilson frame.  All bony prominences were well padded.  Thoracolumbar spine was prepped and draped in a standard fashion.  Appropriate time-out was done to confirm the patient, procedure, and all other pertinent important data.  The patient had most of her programming at the mid body of T9 and half of T10.  I elected to try and do a T11 laminotomy in order to properly position this.  Therefore, an incision was made centered about the T11 spinous process.  Sharp dissection was carried out down to the deep fascia.  Deep fascia was sharply incised and I mobilized the paraspinal muscles to expose the T12 and T11 spinous processes.  I then identified the top pedicle screw  which was at L3 and then counted up to confirm I was at the appropriate level.  Once this was done, a double-action Leksell rongeur was used to remove the majority of the spinous process at T11 and then I used a 3 mm Kerrison and 2 mm Kerrison to effect the T11 laminotomy.  I then used a Penfield 4 to dissect between the ligamentum flavum, the underlying epidural fat, and then used a 2 mm Kerrison rongeur to remove the epidural fat and ligamentum flavum and expose the thecal sac.  Once the dorsal aspect of the thecal sac was exposed, I then passed the spatula up and actually, got it to about the mid portion of T9.  I then passed the actual lead in.  It is at this time that the [place] representative indicated that she was getting most of her programming at the body of T9.  I then tried to advance the lead, so that it would get to the top of T9 but it was quite difficult.  I was very concerned about an iatrogenic spinal cord injury and so I elected to extend my incision superiorly and then perform in the same fashion as I did at T11, a T10 laminotomy.  Once I had this done, I was  able to easily pass the lead to the T9 disk space.  The lead now would cross the entire T9 vertebral body and the superior portion of the T10 vertebral body.  This was a much better placement.  Once I confirmed its position was midline in the AP plane and lateral plane, I then sutured it down with FiberWire directly to the T12 spinous process.  This secured in place, I then wrapped it in between the L1-T12 interlaminar space and secured it down.  I then passed a wire submuscular to the left battery incision site.  I properly positioned this based on her preoperative position, made an incision and dissected down 2.5 cm and created a pocket.  Submuscular passer was advanced from the thoracic wound to the left gluteal region, and then I passed the leads.  I then irrigated the thoracic wound copiously with normal saline,  and then closed the deep fascia with interrupted #1 Vicryl sutures, superficial with 2-0 Vicryl sutures and 3-0 Monocryl.  I did confirm that during the securing of the leads as well as the passing of the leads, that the actual implant did not migrate.  Once I had confirmed this, I then secured the leads to the battery, torqued it down, and then secured the battery to the deep subcutaneous fascia.  I then tested the battery and it was working without issue.  I then closed the battery incision site with #1 Vicryl suture, 2-0 Vicryl suture, and 3-0 Monocryl.  Steri-Strips and dry dressing were applied to both wounds.  My first assistant was Carron Curie.  She was assisting me with suction, implantation, irrigation, suturing, as well as closing the wounds.     Dahlia Bailiff, MD     DDB/MEDQ  D:  03/16/2011  T:  03/16/2011  Job:  [id number]  Electronically Signed by Melina Schools MD on 03/22/2011 10:52:53 AM

## 2011-03-25 ENCOUNTER — Ambulatory Visit (INDEPENDENT_AMBULATORY_CARE_PROVIDER_SITE_OTHER): Payer: Medicare Other | Admitting: Family Medicine

## 2011-03-25 ENCOUNTER — Encounter: Payer: Self-pay | Admitting: Family Medicine

## 2011-03-25 VITALS — BP 120/80 | HR 80 | Temp 97.6°F | Wt 190.0 lb

## 2011-03-25 DIAGNOSIS — R739 Hyperglycemia, unspecified: Secondary | ICD-10-CM

## 2011-03-25 DIAGNOSIS — R7309 Other abnormal glucose: Secondary | ICD-10-CM

## 2011-03-25 NOTE — Progress Notes (Signed)
Sugar check.  Had borderline sugars in past, but not sig elevated prev to having the back stimulator last week.  Did have some elevated sugars in the hospital.  No h/o DM2.  Not on prednisone.  Nonfasting today, drank a regular coke and some coffee this AM.  +FH DM2.    Meds, vitals, and allergies reviewed.   ROS: See HPI.  Otherwise, noncontributory.  nad ncat rrr ctab Back with clean bandage in place and back brace on

## 2011-03-25 NOTE — Patient Instructions (Signed)
You can get your results through our phone system.  Follow the instructions on the blue card. Take care.  Avoid sweets in the meantime.

## 2011-03-27 NOTE — Assessment & Plan Note (Signed)
With normal A1c today, see notes on labs.  D/w pt about low carb diet and briefly d/w her about glucose load/carbs, path/phys of DM2.  She understood.

## 2011-03-30 ENCOUNTER — Other Ambulatory Visit: Payer: Self-pay | Admitting: Family Medicine

## 2011-03-30 MED ORDER — CLONAZEPAM 1 MG PO TABS: 1.0000 mg | ORAL_TABLET | Freq: Two times a day (BID) | ORAL | Status: DC | PRN

## 2011-03-30 NOTE — Telephone Encounter (Signed)
Rx for Clonazepam called to [facility].

## 2011-04-27 ENCOUNTER — Telehealth: Payer: Self-pay | Admitting: Family Medicine

## 2011-04-27 MED ORDER — METFORMIN HCL ER 500 MG PO TB24: 500.0000 mg | ORAL_TABLET | Freq: Every day | ORAL | Status: DC

## 2011-04-27 NOTE — Telephone Encounter (Signed)
Questioned if pharmacy had been in touch about refill request.  Call back is [phone]

## 2011-04-27 NOTE — Telephone Encounter (Signed)
Request for Metformin refill.  Only has 1 left and drugstore is closed tomorrow.  Call back is [phone].

## 2011-04-27 NOTE — Telephone Encounter (Signed)
Rx sent to [facility] electronically, patient notified via telephone.

## 2011-05-06 ENCOUNTER — Encounter (HOSPITAL_COMMUNITY): Payer: Self-pay | Admitting: Pharmacy Technician

## 2011-05-06 ENCOUNTER — Encounter (HOSPITAL_COMMUNITY): Payer: Self-pay | Admitting: *Deleted

## 2011-05-09 ENCOUNTER — Encounter (HOSPITAL_COMMUNITY): Payer: Self-pay | Admitting: Anesthesiology

## 2011-05-09 ENCOUNTER — Encounter (HOSPITAL_COMMUNITY): Payer: Self-pay | Admitting: *Deleted

## 2011-05-09 ENCOUNTER — Encounter (HOSPITAL_COMMUNITY)
Admission: RE | Disposition: A | Discharge status: Home / Self Care | Payer: Self-pay | Source: Ambulatory Visit | Attending: Orthopedic Surgery

## 2011-05-09 ENCOUNTER — Ambulatory Visit (HOSPITAL_COMMUNITY): Payer: Medicare Other | Admitting: Anesthesiology

## 2011-05-09 ENCOUNTER — Inpatient Hospital Stay (HOSPITAL_COMMUNITY)
Admission: RE | Admit: 2011-05-09 | Discharge: 2011-05-13 | DRG: 863 | Disposition: A | Discharge status: Home / Self Care | Payer: Medicare Other | Source: Ambulatory Visit | Attending: Orthopedic Surgery | Admitting: Orthopedic Surgery

## 2011-05-09 DIAGNOSIS — J449 Chronic obstructive pulmonary disease, unspecified: Secondary | ICD-10-CM | POA: Diagnosis present

## 2011-05-09 DIAGNOSIS — T8140XA Infection following a procedure, unspecified, initial encounter: Principal | ICD-10-CM | POA: Diagnosis present

## 2011-05-09 DIAGNOSIS — G4733 Obstructive sleep apnea (adult) (pediatric): Secondary | ICD-10-CM | POA: Diagnosis present

## 2011-05-09 DIAGNOSIS — J4489 Other specified chronic obstructive pulmonary disease: Secondary | ICD-10-CM | POA: Diagnosis present

## 2011-05-09 DIAGNOSIS — K219 Gastro-esophageal reflux disease without esophagitis: Secondary | ICD-10-CM | POA: Diagnosis present

## 2011-05-09 DIAGNOSIS — Y831 Surgical operation with implant of artificial internal device as the cause of abnormal reaction of the patient, or of later complication, without mention of misadventure at the time of the procedure: Secondary | ICD-10-CM | POA: Diagnosis present

## 2011-05-09 DIAGNOSIS — E559 Vitamin D deficiency, unspecified: Secondary | ICD-10-CM | POA: Diagnosis present

## 2011-05-09 DIAGNOSIS — G2581 Restless legs syndrome: Secondary | ICD-10-CM | POA: Diagnosis present

## 2011-05-09 DIAGNOSIS — I10 Essential (primary) hypertension: Secondary | ICD-10-CM | POA: Diagnosis present

## 2011-05-09 DIAGNOSIS — K5900 Constipation, unspecified: Secondary | ICD-10-CM | POA: Diagnosis present

## 2011-05-09 DIAGNOSIS — F17200 Nicotine dependence, unspecified, uncomplicated: Secondary | ICD-10-CM | POA: Diagnosis present

## 2011-05-09 DIAGNOSIS — M545 Low back pain: Secondary | ICD-10-CM

## 2011-05-09 DIAGNOSIS — E785 Hyperlipidemia, unspecified: Secondary | ICD-10-CM | POA: Diagnosis present

## 2011-05-09 HISTORY — PX: LUMBAR WOUND DEBRIDEMENT: SHX1988

## 2011-05-09 HISTORY — DX: Shortness of breath: R06.02

## 2011-05-09 HISTORY — DX: Gastro-esophageal reflux disease without esophagitis: K21.9

## 2011-05-09 HISTORY — DX: Unspecified osteoarthritis, unspecified site: M19.90

## 2011-05-09 HISTORY — DX: Encounter for other specified aftercare: Z51.89

## 2011-05-09 LAB — BASIC METABOLIC PANEL
Calcium: 9.2 mg/dL (ref 8.4–10.5)
Creatinine, Ser: 0.78 mg/dL (ref 0.50–1.10)
GFR calc Af Amer: >90 mL/min (ref >90)
GFR calc non Af Amer: 89 mL/min — ABNORMAL LOW (ref >90)

## 2011-05-09 LAB — CBC
Platelets: 187 10*3/uL (ref 150–400)
RDW: 14.6 % (ref 11.5–15.5)
WBC: 9.4 10*3/uL (ref 4.0–10.5)

## 2011-05-09 LAB — GRAM STAIN

## 2011-05-09 LAB — SURGICAL PCR SCREEN
MRSA, PCR: NEGATIVE
Staphylococcus aureus: NEGATIVE

## 2011-05-09 SURGERY — LUMBAR WOUND DEBRIDEMENT
Anesthesia: General | Site: Back | Wound class: Clean Contaminated

## 2011-05-09 MED ORDER — CEFAZOLIN SODIUM 1-5 GM-% IV SOLN
1.0000 g | Freq: Three times a day (TID) | INTRAVENOUS | Status: AC
  Administered 2011-05-09 – 2011-05-10 (×2): 1 g via INTRAVENOUS
  Filled 2011-05-09 (×2): qty 50

## 2011-05-09 MED ORDER — QUETIAPINE FUMARATE 300 MG PO TABS
300.0000 mg | ORAL_TABLET | Freq: Three times a day (TID) | ORAL | Status: DC
  Administered 2011-05-09 – 2011-05-13 (×12): 300 mg via ORAL
  Filled 2011-05-09 (×14): qty 1

## 2011-05-09 MED ORDER — ONDANSETRON HCL 4 MG/2ML IJ SOLN
INTRAMUSCULAR | Status: DC | PRN
  Administered 2011-05-09: 4 mg via INTRAVENOUS

## 2011-05-09 MED ORDER — ONDANSETRON HCL 4 MG/2ML IJ SOLN: 4.0000 mg | Freq: Once | INTRAMUSCULAR | Status: DC | PRN

## 2011-05-09 MED ORDER — MENTHOL 3 MG MT LOZG: 1.0000 | LOZENGE | OROMUCOSAL | Status: DC | PRN

## 2011-05-09 MED ORDER — LACTATED RINGERS IV SOLN
INTRAVENOUS | Status: DC
  Administered 2011-05-09 – 2011-05-11 (×4): via INTRAVENOUS

## 2011-05-09 MED ORDER — PROPOFOL 10 MG/ML IV EMUL
INTRAVENOUS | Status: DC | PRN
  Administered 2011-05-09: 160 mg via INTRAVENOUS

## 2011-05-09 MED ORDER — MIDAZOLAM HCL 5 MG/5ML IJ SOLN
INTRAMUSCULAR | Status: DC | PRN
  Administered 2011-05-09: 1 mg via INTRAVENOUS

## 2011-05-09 MED ORDER — MORPHINE SULFATE 15 MG PO TABS: 15.0000 mg | ORAL_TABLET | ORAL | Status: DC

## 2011-05-09 MED ORDER — ROCURONIUM BROMIDE 100 MG/10ML IV SOLN
INTRAVENOUS | Status: DC | PRN
  Administered 2011-05-09: 30 mg via INTRAVENOUS

## 2011-05-09 MED ORDER — PROMETHAZINE HCL 12.5 MG PO TABS: 12.5000 mg | ORAL_TABLET | Freq: Four times a day (QID) | ORAL | Status: DC | PRN

## 2011-05-09 MED ORDER — ROPINIROLE HCL 1 MG PO TABS
5.0000 mg | ORAL_TABLET | Freq: Every day | ORAL | Status: DC
  Administered 2011-05-09 – 2011-05-12 (×4): 5 mg via ORAL
  Filled 2011-05-09 (×5): qty 5

## 2011-05-09 MED ORDER — CLONAZEPAM 1 MG PO TABS
1.0000 mg | ORAL_TABLET | Freq: Two times a day (BID) | ORAL | Status: DC | PRN
Start: 2011-05-09 — End: 2011-05-13
  Administered 2011-05-09 – 2011-05-11 (×2): 1 mg via ORAL
  Filled 2011-05-09 (×2): qty 1

## 2011-05-09 MED ORDER — HYDROMORPHONE HCL PF 1 MG/ML IJ SOLN: 0.2500 mg | INTRAMUSCULAR | Status: DC | PRN

## 2011-05-09 MED ORDER — FENTANYL CITRATE 0.05 MG/ML IJ SOLN
INTRAMUSCULAR | Status: DC | PRN
  Administered 2011-05-09: 100 ug via INTRAVENOUS

## 2011-05-09 MED ORDER — ESTRADIOL 0.05 MG/24HR TD PTWK: 0.0500 mg | MEDICATED_PATCH | TRANSDERMAL | Status: DC

## 2011-05-09 MED ORDER — ARIPIPRAZOLE 10 MG PO TABS
30.0000 mg | ORAL_TABLET | Freq: Once | ORAL | Status: AC
  Administered 2011-05-09: 30 mg via ORAL
  Filled 2011-05-09: qty 2

## 2011-05-09 MED ORDER — SODIUM CHLORIDE 0.9 % IV SOLN: 250.0000 mL | INTRAVENOUS | Status: DC

## 2011-05-09 MED ORDER — DEXTROSE 5 % IV SOLN
1.0000 g | INTRAVENOUS | Status: DC | PRN
  Administered 2011-05-09: 2 g via INTRAVENOUS

## 2011-05-09 MED ORDER — MUPIROCIN 2 % EX OINT
TOPICAL_OINTMENT | CUTANEOUS | Status: AC
  Administered 2011-05-09: 12:00:00 via NASAL
  Filled 2011-05-09: qty 22

## 2011-05-09 MED ORDER — GLYCOPYRROLATE 0.2 MG/ML IJ SOLN
INTRAMUSCULAR | Status: DC | PRN
  Administered 2011-05-09: .8 mg via INTRAVENOUS

## 2011-05-09 MED ORDER — MORPHINE SULFATE 15 MG PO TABS
15.0000 mg | ORAL_TABLET | Freq: Four times a day (QID) | ORAL | Status: DC | PRN
  Administered 2011-05-09 – 2011-05-10 (×2): 15 mg via ORAL
  Filled 2011-05-09 (×2): qty 1

## 2011-05-09 MED ORDER — CYCLOBENZAPRINE HCL 10 MG PO TABS
10.0000 mg | ORAL_TABLET | Freq: Three times a day (TID) | ORAL | Status: DC | PRN
  Administered 2011-05-09 – 2011-05-11 (×2): 10 mg via ORAL
  Filled 2011-05-09 (×2): qty 1

## 2011-05-09 MED ORDER — HYDROMORPHONE HCL PF 1 MG/ML IJ SOLN
0.2500 mg | INTRAMUSCULAR | Status: DC | PRN
  Administered 2011-05-09 (×4): 0.5 mg via INTRAVENOUS

## 2011-05-09 MED ORDER — CEFAZOLIN SODIUM 1-5 GM-% IV SOLN
INTRAVENOUS | Status: AC
  Filled 2011-05-09: qty 100

## 2011-05-09 MED ORDER — DOCUSATE SODIUM 100 MG PO CAPS: 100.0000 mg | ORAL_CAPSULE | Freq: Two times a day (BID) | ORAL | Status: DC | PRN

## 2011-05-09 MED ORDER — HYDROCODONE-ACETAMINOPHEN 10-325 MG PO TABS
1.0000 | ORAL_TABLET | Freq: Four times a day (QID) | ORAL | Status: DC | PRN
  Administered 2011-05-09 – 2011-05-10 (×3): 1 via ORAL
  Filled 2011-05-09 (×3): qty 1

## 2011-05-09 MED ORDER — METFORMIN HCL ER 500 MG PO TB24
500.0000 mg | ORAL_TABLET | Freq: Every day | ORAL | Status: DC
  Administered 2011-05-10 – 2011-05-13 (×4): 500 mg via ORAL
  Filled 2011-05-09 (×5): qty 1

## 2011-05-09 MED ORDER — SODIUM CHLORIDE 0.9 % IR SOLN
Status: DC | PRN
  Administered 2011-05-09: 3000 mL

## 2011-05-09 MED ORDER — ESTRADIOL 0.05 MG/24HR TD PTWK
0.0500 mg | MEDICATED_PATCH | TRANSDERMAL | Status: DC
  Filled 2011-05-09: qty 1

## 2011-05-09 MED ORDER — LACTATED RINGERS IV SOLN
INTRAVENOUS | Status: DC | PRN
  Administered 2011-05-09: 13:00:00 via INTRAVENOUS

## 2011-05-09 MED ORDER — PANTOPRAZOLE SODIUM 40 MG PO TBEC
40.0000 mg | DELAYED_RELEASE_TABLET | Freq: Every day | ORAL | Status: DC
  Administered 2011-05-09 – 2011-05-13 (×5): 40 mg via ORAL
  Filled 2011-05-09 (×5): qty 1

## 2011-05-09 MED ORDER — ZOLPIDEM TARTRATE 10 MG PO TABS
10.0000 mg | ORAL_TABLET | Freq: Every evening | ORAL | Status: DC | PRN
  Administered 2011-05-09 – 2011-05-12 (×2): 10 mg via ORAL
  Filled 2011-05-09 (×2): qty 1

## 2011-05-09 MED ORDER — SODIUM CHLORIDE 0.9 % IJ SOLN
3.0000 mL | Freq: Two times a day (BID) | INTRAMUSCULAR | Status: DC
  Administered 2011-05-09 – 2011-05-13 (×5): 3 mL via INTRAVENOUS

## 2011-05-09 MED ORDER — PHENOL 1.4 % MT LIQD: 1.0000 | OROMUCOSAL | Status: DC | PRN

## 2011-05-09 MED ORDER — LAMOTRIGINE 100 MG PO TABS
100.0000 mg | ORAL_TABLET | Freq: Every day | ORAL | Status: DC
  Administered 2011-05-09 – 2011-05-13 (×5): 100 mg via ORAL
  Filled 2011-05-09 (×5): qty 1

## 2011-05-09 MED ORDER — NEOSTIGMINE METHYLSULFATE 1 MG/ML IJ SOLN
INTRAMUSCULAR | Status: DC | PRN
  Administered 2011-05-09: 4 mg via INTRAVENOUS

## 2011-05-09 MED ORDER — SODIUM CHLORIDE 0.9 % IJ SOLN: 3.0000 mL | INTRAMUSCULAR | Status: DC | PRN

## 2011-05-09 MED ORDER — LACTATED RINGERS IV SOLN: INTRAVENOUS | Status: DC

## 2011-05-09 MED ORDER — NABUMETONE 750 MG PO TABS
750.0000 mg | ORAL_TABLET | Freq: Two times a day (BID) | ORAL | Status: DC
  Administered 2011-05-09 – 2011-05-13 (×8): 750 mg via ORAL
  Filled 2011-05-09 (×9): qty 1

## 2011-05-09 SURGICAL SUPPLY — 62 items
BUR EGG ELITE 4.0 (BURR) IMPLANT
CANISTER SUCTION 2500CC (MISCELLANEOUS) ×2 IMPLANT
CLOTH BEACON ORANGE TIMEOUT ST (SAFETY) ×2 IMPLANT
CORDS BIPOLAR (ELECTRODE) ×2 IMPLANT
COVER SURGICAL LIGHT HANDLE (MISCELLANEOUS) ×2 IMPLANT
DRAIN PENROSE 1/4X12 LTX STRL (WOUND CARE) ×1 IMPLANT
DRAPE POUCH INSTRU U-SHP 10X18 (DRAPES) ×2 IMPLANT
DRAPE PROXIMA HALF (DRAPES) ×2 IMPLANT
DRAPE SURG 17X23 STRL (DRAPES) ×2 IMPLANT
DRAPE U-SHAPE 47X51 STRL (DRAPES) ×2 IMPLANT
DRSG ADAPTIC 3X8 NADH LF (GAUZE/BANDAGES/DRESSINGS) ×1 IMPLANT
DRSG MEPILEX BORDER 4X8 (GAUZE/BANDAGES/DRESSINGS) ×1 IMPLANT
DRSG PAD ABDOMINAL 8X10 ST (GAUZE/BANDAGES/DRESSINGS) ×1 IMPLANT
DURAPREP 26ML APPLICATOR (WOUND CARE) ×2 IMPLANT
ELECT BLADE 4.0 EZ CLEAN MEGAD (MISCELLANEOUS)
ELECT CAUTERY BLADE 6.4 (BLADE) ×2 IMPLANT
ELECT REM PT RETURN 9FT ADLT (ELECTROSURGICAL) ×2
ELECTRODE BLDE 4.0 EZ CLN MEGD (MISCELLANEOUS) IMPLANT
ELECTRODE REM PT RTRN 9FT ADLT (ELECTROSURGICAL) ×1 IMPLANT
EVACUATOR 1/8 PVC DRAIN (DRAIN) IMPLANT
GAUZE SPONGE 4X4 12PLY STRL LF (GAUZE/BANDAGES/DRESSINGS) ×1 IMPLANT
GLOVE BIOGEL PI IND STRL 6.5 (GLOVE) ×1 IMPLANT
GLOVE BIOGEL PI IND STRL 8.5 (GLOVE) ×1 IMPLANT
GLOVE BIOGEL PI INDICATOR 6.5 (GLOVE) ×1
GLOVE BIOGEL PI INDICATOR 8.5 (GLOVE) ×1
GLOVE ECLIPSE 6.0 STRL STRAW (GLOVE) ×2 IMPLANT
GLOVE ECLIPSE 8.5 STRL (GLOVE) ×2 IMPLANT
GOWN PREVENTION PLUS XXLARGE (GOWN DISPOSABLE) ×2 IMPLANT
GOWN STRL NON-REIN LRG LVL3 (GOWN DISPOSABLE) ×4 IMPLANT
HANDPIECE INTERPULSE COAX TIP (DISPOSABLE) ×2
IV NS IRRIG 3000ML ARTHROMATIC (IV SOLUTION) ×1 IMPLANT
KIT BASIN OR (CUSTOM PROCEDURE TRAY) ×2 IMPLANT
KIT ROOM TURNOVER OR (KITS) ×2 IMPLANT
NDL SPNL 18GX3.5 QUINCKE PK (NEEDLE) ×2 IMPLANT
NEEDLE 22X1 1/2 (OR ONLY) (NEEDLE) ×2 IMPLANT
NEEDLE SPNL 18GX3.5 QUINCKE PK (NEEDLE) ×4 IMPLANT
NS IRRIG 1000ML POUR BTL (IV SOLUTION) ×2 IMPLANT
PACK LAMINECTOMY ORTHO (CUSTOM PROCEDURE TRAY) ×2 IMPLANT
PACK UNIVERSAL I (CUSTOM PROCEDURE TRAY) ×2 IMPLANT
PAD ARMBOARD 7.5X6 YLW CONV (MISCELLANEOUS) ×4 IMPLANT
PATTIES SURGICAL .5 X.5 (GAUZE/BANDAGES/DRESSINGS) IMPLANT
PATTIES SURGICAL .5 X1 (DISPOSABLE) IMPLANT
PIN SAFETY STERILE (MISCELLANEOUS) ×1 IMPLANT
SET HNDPC FAN SPRY TIP SCT (DISPOSABLE) IMPLANT
SPONGE LAP 18X18 X RAY DECT (DISPOSABLE) ×1 IMPLANT
SPONGE SURGIFOAM ABS GEL 100 (HEMOSTASIS) IMPLANT
STRIP CLOSURE SKIN 1/2X4 (GAUZE/BANDAGES/DRESSINGS) ×2 IMPLANT
SURGIFLO TRUKIT (HEMOSTASIS) IMPLANT
SUT MNCRL AB 3-0 PS2 18 (SUTURE) ×1 IMPLANT
SUT PDS AB 1 CT  36 (SUTURE) ×3
SUT PDS AB 1 CT 36 (SUTURE) IMPLANT
SUT VIC AB 0 CT1 27 (SUTURE)
SUT VIC AB 0 CT1 27XBRD ANBCTR (SUTURE) ×1 IMPLANT
SUT VIC AB 1 CTX 36 (SUTURE)
SUT VIC AB 1 CTX36XBRD ANBCTR (SUTURE) ×2 IMPLANT
SUT VIC AB 2-0 CT1 18 (SUTURE) ×1 IMPLANT
SYR BULB IRRIGATION 50ML (SYRINGE) ×2 IMPLANT
SYR CONTROL 10ML LL (SYRINGE) ×2 IMPLANT
TOWEL OR 17X24 6PK STRL BLUE (TOWEL DISPOSABLE) ×2 IMPLANT
TOWEL OR 17X26 10 PK STRL BLUE (TOWEL DISPOSABLE) ×2 IMPLANT
WATER STERILE IRR 1000ML POUR (IV SOLUTION) ×2 IMPLANT
YANKAUER SUCT BULB TIP NO VENT (SUCTIONS) ×2 IMPLANT

## 2011-05-09 NOTE — Anesthesia Preprocedure Evaluation (Addendum)
Anesthesia Evaluation  Patient identified by MRN, date of birth, ID band Patient awake    Reviewed: Allergy & Precautions, H&P , NPO status , Patient's Chart, lab work & pertinent test results  Airway Mallampati: II TM Distance: <3 FB Neck ROM: full    Dental  (+) Edentulous Upper and Edentulous Lower   Pulmonary shortness of breath, sleep apnea , COPD + rhonchi        Cardiovascular Exercise Tolerance: Poor hypertension, regular Normal    Neuro/Psych PSYCHIATRIC DISORDERS  Neuromuscular disease    GI/Hepatic GERD-  ,(+) Hepatitis -, B  Endo/Other  Negative Endocrine ROS  Renal/GU negative Renal ROS  Genitourinary negative   Musculoskeletal   Abdominal   Peds  Hematology negative hematology ROS (+)   Anesthesia Other Findings   Reproductive/Obstetrics                          Anesthesia Physical Anesthesia Plan  ASA: III  Anesthesia Plan: General   Post-op Pain Management:    Induction: Intravenous  Airway Management Planned: Oral ETT  Additional Equipment:   Intra-op Plan:   Post-operative Plan: Extubation in OR  Informed Consent: I have reviewed the patients History and Physical, chart, labs and discussed the procedure including the risks, benefits and alternatives for the proposed anesthesia with the patient or authorized representative who has indicated his/her understanding and acceptance.     Plan Discussed with: Surgeon, CRNA and Anesthesiologist  Anesthesia Plan Comments:         Anesthesia Quick Evaluation

## 2011-05-09 NOTE — Transfer of Care (Signed)
Immediate Anesthesia Transfer of Care Note  Patient: Stephanie [name]  Procedure(s) Performed:  LUMBAR WOUND DEBRIDEMENT - IRRIGATION AND DEBRIDEMENT SPINAL WOUND  Patient Location: PACU  Anesthesia Type: General  Level of Consciousness: awake  Airway & Oxygen Therapy: Patient Spontanous Breathing and Patient connected to face mask oxygen  Post-op Assessment: Report given to PACU RN and Post -op Vital signs reviewed and stable  Post vital signs: stable  Complications: No apparent anesthesia complications

## 2011-05-09 NOTE — H&P (Signed)
Refer to office notes for details of H+P No change in clinical exam Patient re-examined - no changes Plan on I+D of wound given failure to close

## 2011-05-09 NOTE — Preoperative (Signed)
Beta Blockers   Reason not to administer Beta Blockers:Not Applicable 

## 2011-05-09 NOTE — Anesthesia Postprocedure Evaluation (Signed)
  Anesthesia Post-op Note  Patient: Stephanie [name]  Procedure(s) Performed:  LUMBAR WOUND DEBRIDEMENT - IRRIGATION AND DEBRIDEMENT SPINAL WOUND  Patient Location: PACU  Anesthesia Type: General  Level of Consciousness: awake and alert   Airway and Oxygen Therapy: Patient Spontanous Breathing and Patient connected to nasal cannula oxygen  Post-op Pain: mild  Post-op Assessment: Post-op Vital signs reviewed, Patient's Cardiovascular Status Stable, Respiratory Function Stable, Patent Airway, No signs of Nausea or vomiting and Pain level controlled  Post-op Vital Signs: Reviewed and stable  Complications: No apparent anesthesia complications

## 2011-05-09 NOTE — Op Note (Signed)
OPERATIVE REPORT  DATE OF SURGERY: 05/09/2011  PATIENT NAME:  Stephanie [name] MRN: [id number] DOB: Jan 13, 1951  PCP: [name], MD, MD  PRE-OPERATIVE DIAGNOSIS:  Superficial wound infection lumbar spine  POST-OPERATIVE DIAGNOSIS:  Same  PROCEDURE:   I&D of wound infection  SURGEON:  Melina Schools, MD  PHYSICIAN ASSISTANT: Carron Curie   ANESTHESIA:   General  EBL: Minimal ml   BRIEF HISTORY: DEONDRIA PURYEAR is a 60 y.o. female who had a spinal cord stimulator placed approximately 2-1/2 months ago. The the thoracic wound site as her eschar formed. This eschar was debrided in the office she still has right wound dehiscence. There is no drainage or purulent material but the wound itself is having difficulty closing. Out of concern for leg wound and deep wound infection I discussed taking her to the OR for I&D and wound and close it primarily. Risks benefits alternatives were explained to the patient and she consented.  PROCEDURE DETAILS: Patient was brought into the operating room. After successful induction of general anesthesia and tracheal intubation a Time Out was done. This confirmed all pertinent important data. Patient was turned prone onto chest and pelvic rolls. The back was prepped draped in standard fashion.  15 blade scalpel is used to debride the skin edges down to bleeding viable tissue. There is no drainage or purulent material noted. Cultures were taken and antibiotics are started intraoperative.  Once the wound edges were clean and debridement I then placed a Penrose drain and closed with a vertical interrupted mattress suture with #1 PDS. A dry dressing was applied the patient was extubated and transferred to PACU then ensued. The end of the case all needle sponge counts were correct. No adverse intraoperative events.   Melina Schools, MD 05/09/2011 2:44 PM

## 2011-05-10 MED ORDER — CEPHALEXIN 500 MG PO CAPS: 500.0000 mg | ORAL_CAPSULE | Freq: Four times a day (QID) | ORAL | Status: DC

## 2011-05-10 MED ORDER — HYDROMORPHONE HCL PF 1 MG/ML IJ SOLN
0.5000 mg | INTRAMUSCULAR | Status: DC | PRN
  Administered 2011-05-10 (×2): 1 mg via INTRAVENOUS
  Filled 2011-05-10 (×2): qty 1

## 2011-05-10 MED ORDER — OXYCODONE HCL 5 MG PO TABS
10.0000 mg | ORAL_TABLET | ORAL | Status: DC | PRN
  Administered 2011-05-10 – 2011-05-13 (×11): 10 mg via ORAL
  Filled 2011-05-10 (×8): qty 2
  Filled 2011-05-10: qty 1
  Filled 2011-05-10 (×2): qty 2

## 2011-05-10 NOTE — Plan of Care (Addendum)
Problem: Consults Goal: Spinal Surgery Patient Education See Patient Education Module for education specifics. Outcome: Progressing Lumbar wound irrigation and debridement

## 2011-05-10 NOTE — Discharge Summary (Signed)
Patient ID: Stephanie [name] MRN: [id number] DOB/AGE: 1951/02/27 60 y.o.  Admit date: 05/09/2011 Discharge date: 05/13/2011  Admission Diagnoses:  Active Problems: Superficial spinal wound infection s/p SCS placement  Discharge Diagnoses:  Same  Past Medical History  Diagnosis Date  . Adenomatous colon polyp   . Insomnia   . RLS (restless legs syndrome)   . Colon polyp   . Incontinence   . Hepatitis B, chronic   . H/O: rheumatic fever   . Hyperlipidemia   . COPD (chronic obstructive pulmonary disease)   . Unspecified disorders of nervous system   . Bipolar affective disorder     h/o  . H/O: CVA (cardiovascular accident)     TIA  . Fatty liver 04/09/08    found in abd CT  . Shortness of breath   . Obstructive sleep apnea     not using CPAP, last sleep study 2.5 years ago, per pt doctor is aware  . Blood transfusion 2000  . GERD (gastroesophageal reflux disease)   . Arthritis     Surgeries: Procedure(s): LUMBAR WOUND DEBRIDEMENT on 05/09/2011   Consultants:    Discharged Condition: Improved  Hospital Course: Stephanie [name] is an 60 y.o. female who was admitted 05/09/2011 for operative treatment of an infected superficial spinal wound. Patient has severe unremitting pain that affects sleep, daily activities, and work/hobbies. After pre-op clearance the patient was taken to the operating room on 05/09/2011 and underwent  Procedure(s): Bostic.    Patient was given perioperative antibiotics: Anti-infectives     Start     Dose/Rate Route Frequency Ordered Stop   05/09/11 2200   ceFAZolin (ANCEF) IVPB 1 g/50 mL premix        1 g 100 mL/hr over 30 Minutes Intravenous 3 times per day 05/09/11 1913 05/10/11 0640           Patient was given sequential compression devices and early ambulation to prevent DVT.  PT/OT reported that it would be unsafe for her to be discharged to home on POD #1.  Her status was changed to inpatient.  On 05/13/11 a bed was  available and as she meet all other goals, she was discharged to SNF.      Patient benefited maximally from hospital stay and there were no complications.    Recent vital signs: Patient Vitals for the past 24 hrs:  BP Temp Temp src Pulse Resp SpO2 Height Weight  05/10/11 0655 92/60 mmHg - - - - - - -  05/10/11 0600 87/5 mmHg 98 F (36.7 C) - 87  16  90 % - -  05/10/11 0315 96/67 mmHg 97.9 F (36.6 C) - 86  16  90 % - -  05/10/11 0115 91/58 mmHg 98 F (36.7 C) - 86  16  96 % - -  05/09/11 2316 107/69 mmHg 97.4 F (36.3 C) - 61  16  95 % - -  05/09/11 2115 100/64 mmHg 98.2 F (36.8 C) - 88  16  94 % - -  05/09/11 1825 118/83 mmHg 97.8 F (36.6 C) - 96  16  96 % - -  05/09/11 1803 128/73 mmHg 97.9 F (36.6 C) - - 15  - - -  05/09/11 1800 - - - 90  15  99 % - -  05/09/11 1733 114/72 mmHg - - 87  13  98 % - -  05/09/11 1730 - - - 85  14  100 % - -  05/09/11  1718 133/73 mmHg - - 85  15  99 % - -  05/09/11 1703 129/91 mmHg - - 84  16  99 % - -  05/09/11 1700 - - - 87  15  99 % - -  05/09/11 1647 - - - 85  13  98 % - -  05/09/11 1645 125/68 mmHg - - 84  14  98 % - -  05/09/11 1633 104/67 mmHg - - 82  13  98 % - -  05/09/11 1630 - - - 85  15  96 % - -  05/09/11 1617 - - - 84  15  97 % - -  05/09/11 1615 113/60 mmHg - - 86  14  96 % - -  05/09/11 1603 99/51 mmHg - - 77  15  95 % - -  05/09/11 1547 - - - 83  17  95 % - -  05/09/11 1545 99/49 mmHg - - 86  17  95 % - -  05/09/11 1533 94/56 mmHg - - 86  14  95 % - -  05/09/11 1518 122/66 mmHg - - 87  17  98 % - -  05/09/11 1503 105/55 mmHg - - 90  17  98 % - -  05/09/11 1500 - - - 92  20  97 % - -  05/09/11 1457 105/55 mmHg 97.4 F (36.3 C) - 92  14  97 % - -  05/09/11 1213 - - - - - - 5' 3"  (1.6 m) 81.194 kg (179 lb)  05/09/11 1201 121/80 mmHg 97.7 F (36.5 C) Oral 87  18  98 % - -     Recent laboratory studies:  Basename 05/09/11 1158  WBC 9.4  HGB 13.0  HCT 39.5  PLT 187  NA 139  K 3.9  CL 103  CO2 25  BUN 14    CREATININE 0.78  GLUCOSE 108*  INR --  CALCIUM 9.2     Discharge Medications:  Current Discharge Medication List    CONTINUE these medications which have NOT CHANGED   Details  ABILIFY 30 MG tablet TAKE 1 TABLET BY MOUTH EVERY MORNING Qty: 30 tablet, Refills: 0    clonazePAM (KLONOPIN) 1 MG tablet Take 1 mg by mouth 2 (two) times daily as needed. For anxiety     cyclobenzaprine (FLEXERIL) 10 MG tablet Take one tablet by mouth twice daily as needed for back pain Qty: 90 tablet, Refills: 0    docusate sodium (COLACE) 100 MG capsule Take 100 mg by mouth 2 (two) times daily as needed. For constipation     estradiol (VIVELLE-DOT) 0.1 MG/24HR Place 1 patch onto the skin 2 (two) times a week.      HYDROcodone-acetaminophen (NORCO) 10-325 MG per tablet Take 1 tablet by mouth. Every 6-8 hours as needed for pain     lamoTRIgine (LAMICTAL) 100 MG tablet Take 3 tablets by mouth once daily    metFORMIN (GLUCOPHAGE-XR) 500 MG 24 hr tablet Take 1 tablet (500 mg total) by mouth daily with breakfast. Qty: 30 tablet, Refills: 6    nabumetone (RELAFEN) 750 MG tablet Take 750 mg by mouth 2 (two) times daily.      omeprazole (PRILOSEC) 40 MG capsule Take 40 mg by mouth daily.      promethazine (PHENERGAN) 25 MG tablet Take 1/2-1 by mouth every 8 hours as needed for nausea     QUEtiapine (SEROQUEL) 300 MG tablet Take 300 mg by mouth 3 (  three) times daily.      ropinirole (REQUIP) 5 MG tablet Take 5 mg by mouth at bedtime.      zolpidem (AMBIEN) 10 MG tablet Take 10 mg by mouth at bedtime as needed. For sleep    naproxen (NAPROSYN) 500 MG tablet Take 500 mg by mouth 2 (two) times daily with a meal.      nystatin (MYCOSTATIN) powder Apply topically 2 (two) times daily. Qty: 15 g, Refills: 0      STOP taking these medications     morphine (MSIR) 15 MG tablet         Diagnostic Studies: No results found.  Disposition: DC to SNF Condition at Discharge:  STABLE    Follow-up  Information    Follow up with BROOKS,DAHARI D in 1 week.   Contact information:   [facility] [address], [address] [place] [phone]           Signed: Jacquelynn Cree 05/10/2011, 7:42 AM

## 2011-05-10 NOTE — Progress Notes (Signed)
Stephanie [name] 60 y.o. 05/09/2011   Lab. Results:  Basename 05/09/11 1158  WBC 9.4  HGB 13.0  HCT 39.5  PLT 187   BMET  Basename 05/09/11 1158  NA 139  K 3.9  CL 103  CO2 25  GLUCOSE 108*  BUN 14  CREATININE 0.78  CALCIUM 9.2    INR  Date Value Range Status  04/30/2007 0.8 RATIO  0.8-1.0 (no units) Final   VITALS Filed Vitals:   05/10/11 1651  BP: 109/74  Pulse: 92  Temp:   Resp:      Subjective Pain controlled with percocet Less nausea     Objective Wound - C/D/I NVI No sob/cp tol diet  Assessment/ Plan Patient not cleared by PT/OT for home discharge Order to change from outpatient to inpatient placed. Unclear if she meets criteria for inpatient.   If she does not meet criteria and is not deemed safe for D/C to home not sure what next step should be Will transfer to 5000  Re-eval in the AM ? D/C to home with HHPT/OT and visiting Nsg.  Stephanie [name] D 12/4/20126:04 PM

## 2011-05-10 NOTE — Plan of Care (Signed)
Problem: Phase II Progression Outcomes Goal: Discharge plan established Outcome: Progressing OT recommending ST-SNF at D/C due to pt. Lives alone and requiring assist with ADLs currently. Will follow acutely. Thanks!  Lavona Mound, OTR/L Pager: [phone] 05/10/2011 .

## 2011-05-10 NOTE — Progress Notes (Signed)
Physical Therapy Evaluation Patient Details Name: Stephanie [name] MRN: [id number] DOB: Jan 22, 1951 Today's Date: 05/10/2011  Problem List:  Patient Active Problem List  Diagnoses  . HEPATITIS B, CHRONIC  . Benign Neoplasm of Colon  . UNSPECIFIED VITAMIN D DEFICIENCY  . HYPERLIPIDEMIA  . TOBACCO ABUSE  . OBSTRUCTIVE SLEEP APNEA  . RESTLESS LEGS SYNDROME  . DISORDER, NERVOUS SYSTEM NOS  . RHEUMATIC FEVER  . HYPERTENSION  . RHINITIS  . COPD  . SMALL BOWEL OBSTRUCTION  . OTHER CONSTIPATION  . CELLULITIS AND ABSCESS OF OTHER SPECIFIED SITE  . FOLLICULITIS  . SKIN LESION  . HIP PAIN  . BACK PAIN, LUMBAR, CHRONIC  . SCIATICA, LEFT  . INSOMNIA  . EDEMA, LOCALIZED  . NAUSEA ALONE  . INCONTINENCE  . ABDOMINAL PAIN  . PELVIC PAIN, ACUTE  . MAMMOGRAM, ABNORMAL, RIGHT  . UNS ADVRS EFF OTH RX MEDICINAL&BIOLOGICAL SBSTNC  . BIPOLAR AFFECTIVE DISORDER, HX OF  . PERSONAL HISTORY OF COLONIC POLYPS  . DYSURIA  . INTERTRIGO, CANDIDAL  . RIB PAIN, RIGHT SIDED  . Routine general medical examination at a health care facility  . Hyperglycemia  . Localized swelling, mass, or lump of lower extremity  . Preop examination    Past Medical History:  Past Medical History  Diagnosis Date  . Adenomatous colon polyp   . Insomnia   . RLS (restless legs syndrome)   . Colon polyp   . Incontinence   . Hepatitis B, chronic   . H/O: rheumatic fever   . Hyperlipidemia   . COPD (chronic obstructive pulmonary disease)   . Unspecified disorders of nervous system   . Bipolar affective disorder     h/o  . H/O: CVA (cardiovascular accident)     TIA  . Fatty liver 04/09/08    found in abd CT  . Shortness of breath   . Obstructive sleep apnea     not using CPAP, last sleep study 2.5 years ago, per pt doctor is aware  . Blood transfusion 2000  . GERD (gastroesophageal reflux disease)   . Arthritis    Past Surgical History:  Past Surgical History  Procedure Date  . Back surgery   .  Cholecystectomy   . Rotator cuff repair   . Abdominal hysterectomy     total  . Lumbar fusion 10/09  . Hemorroidectomy     and colon polyp removed  . Abdominal exploration surgery   . Posterior cervical fusion/foraminotomy   . Eye surgery     bilat    PT Assessment/Plan/Recommendation PT Assessment Clinical Impression Statement: Pt limited with mobility due to pain.  Pt. lives alone and no significant help available.   PT Recommendation/Assessment: Patient will need skilled PT in the acute care venue PT Problem List: Decreased strength;Decreased activity tolerance;Pain;Decreased mobility;Decreased balance Barriers to Discharge: Decreased caregiver support PT Therapy Diagnosis : Difficulty walking;Acute pain PT Plan PT Frequency: Min 5X/week PT Treatment/Interventions: Gait training;DME instruction;Functional mobility training;Balance training;Patient/family education PT Recommendation Follow Up Recommendations: Skilled nursing facility Equipment Recommended: Defer to next venue PT Goals  Acute Rehab PT Goals PT Goal Formulation: With patient Time For Goal Achievement: 7 days Pt will Roll Supine to Left Side: with modified independence PT Goal: Rolling Supine to Left Side - Progress: Not met Pt will go Supine/Side to Sit: with modified independence PT Goal: Supine/Side to Sit - Progress: Not met Pt will go Sit to Stand: with modified independence PT Goal: Sit to Stand - Progress: Not met Pt  will go Stand to Sit: with modified independence PT Goal: Stand to Sit - Progress: Not met Pt will Ambulate: 51 - 150 feet;with modified independence;with least restrictive assistive device PT Goal: Ambulate - Progress: Not met  PT Evaluation Precautions/Restrictions    Prior Functioning      Cognition Cognition Arousal/Alertness: Awake/alert Overall Cognitive Status: Appears within functional limits for tasks assessed Orientation Level: Oriented X4 Sensation/Coordination     Extremity Assessment RLE Strength RLE Overall Strength Comments: Grossly 4/5.  Pain limiting. LLE Strength LLE Overall Strength Comments: Grossly 4/5.  Limited by pain. Mobility (including Balance) Transfers Sit to Stand: 4: Min assist Ambulation/Gait Ambulation/Gait: Yes Ambulation/Gait Assistance: 4: Min assist (min guard ) Ambulation/Gait Assistance Details (indicate cue type and reason): verbal cues to stand more erect Ambulation Distance (Feet): 80 Feet Assistive device: Rolling walker Gait Pattern: Decreased step length - right;Decreased step length - left;Trunk flexed Gait velocity: slow cadence  Static Standing Balance Static Standing - Balance Support: Right upper extremity supported Exercise    End of Session PT - End of Session Equipment Utilized During Treatment: Gait belt Activity Tolerance: Patient limited by pain Patient left: in chair;with call bell in reach General Behavior During Session: [facility] for tasks performed Cognition: [facility] for tasks performed  [facility] 05/10/2011, 5:29 PM [employer] PT [phone]

## 2011-05-10 NOTE — Progress Notes (Signed)
Subjective: Procedure(s) (LRB): LUMBAR WOUND DEBRIDEMENT (N/A) 1 Day Post-Op  Patient reports pain as severe.  Reports incisional back pain   Positive void Negative bowel movement Positive flatus Negative chest pain or shortness of breath  Objective: Vital signs in last 24 hours: Temp:  [97.4 F (36.3 C)-98.2 F (36.8 C)] 98 F (36.7 C) (12/04 0600) Pulse Rate:  [61-96] 87  (12/04 0600) Resp:  [13-20] 16  (12/04 0600) BP: (87-133)/(5-91) 92/60 mmHg (12/04 0655) SpO2:  [90 %-100 %] 90 % (12/04 0600) Weight:  [81.194 kg (179 lb)] 179 lb (81.194 kg) (12/03 1213)  Intake/Output from previous day: 12/03 0701 - 12/04 0700 In: 5015 [I.V.:1650] Out: 5 [Blood:5]   Basename 05/09/11 1158  WBC 9.4  RBC 4.35  HCT 39.5  PLT 187    Basename 05/09/11 1158  NA 139  K 3.9  CL 103  CO2 25  BUN 14  CREATININE 0.78  GLUCOSE 108*  CALCIUM 9.2   No results found for this basename: LABPT:2,INR:2 in the last 72 hours  ABD soft Neurovascular intact Sensation intact distally Intact pulses distally Incision: dressing C/D/I Compartment soft  Assessment/Plan: Patient stable  Dressings taken down, drain removed, redressed Continue care Discharge today to home    Jacquelynn Cree 05/10/2011, 7:37 AM

## 2011-05-10 NOTE — Progress Notes (Signed)
Transferred patient to [id number]. Gave report to [facility]. Pt settled into room.

## 2011-05-10 NOTE — Progress Notes (Signed)
Utilization review completed. Rozanna Boer, RN, BSN. 05/10/11

## 2011-05-10 NOTE — Progress Notes (Signed)
Occupational Therapy Evaluation Patient Details Name: Stephanie [name] MRN: [id number] DOB: 09/15/1950 Today's Date: 05/10/2011  Problem List:  Patient Active Problem List  Diagnoses  . HEPATITIS B, CHRONIC  . Benign Neoplasm of Colon  . UNSPECIFIED VITAMIN D DEFICIENCY  . HYPERLIPIDEMIA  . TOBACCO ABUSE  . OBSTRUCTIVE SLEEP APNEA  . RESTLESS LEGS SYNDROME  . DISORDER, NERVOUS SYSTEM NOS  . RHEUMATIC FEVER  . HYPERTENSION  . RHINITIS  . COPD  . SMALL BOWEL OBSTRUCTION  . OTHER CONSTIPATION  . CELLULITIS AND ABSCESS OF OTHER SPECIFIED SITE  . FOLLICULITIS  . SKIN LESION  . HIP PAIN  . BACK PAIN, LUMBAR, CHRONIC  . SCIATICA, LEFT  . INSOMNIA  . EDEMA, LOCALIZED  . NAUSEA ALONE  . INCONTINENCE  . ABDOMINAL PAIN  . PELVIC PAIN, ACUTE  . MAMMOGRAM, ABNORMAL, RIGHT  . UNS ADVRS EFF OTH RX MEDICINAL&BIOLOGICAL SBSTNC  . BIPOLAR AFFECTIVE DISORDER, HX OF  . PERSONAL HISTORY OF COLONIC POLYPS  . DYSURIA  . INTERTRIGO, CANDIDAL  . RIB PAIN, RIGHT SIDED  . Routine general medical examination at a health care facility  . Hyperglycemia  . Localized swelling, mass, or lump of lower extremity  . Preop examination    Past Medical History:  Past Medical History  Diagnosis Date  . Adenomatous colon polyp   . Insomnia   . RLS (restless legs syndrome)   . Colon polyp   . Incontinence   . Hepatitis B, chronic   . H/O: rheumatic fever   . Hyperlipidemia   . COPD (chronic obstructive pulmonary disease)   . Unspecified disorders of nervous system   . Bipolar affective disorder     h/o  . H/O: CVA (cardiovascular accident)     TIA  . Fatty liver 04/09/08    found in abd CT  . Shortness of breath   . Obstructive sleep apnea     not using CPAP, last sleep study 2.5 years ago, per pt doctor is aware  . Blood transfusion 2000  . GERD (gastroesophageal reflux disease)   . Arthritis    Past Surgical History:  Past Surgical History  Procedure Date  . Back surgery   .  Cholecystectomy   . Rotator cuff repair   . Abdominal hysterectomy     total  . Lumbar fusion 10/09  . Hemorroidectomy     and colon polyp removed  . Abdominal exploration surgery   . Posterior cervical fusion/foraminotomy   . Eye surgery     bilat    OT Assessment/Plan/Recommendation OT Assessment OT Recommendation/Assessment: Patient will need skilled OT in the acute care venue OT Problem List: Decreased strength;Decreased activity tolerance;Impaired balance (sitting and/or standing);Decreased safety awareness;Decreased knowledge of use of DME or AE;Decreased knowledge of precautions;Pain Barriers to Discharge: Decreased caregiver support OT Therapy Diagnosis : Acute pain OT Plan OT Frequency: Min 2X/week OT Recommendation Follow Up Recommendations: Skilled nursing facility Equipment Recommended: Defer to next venue Individuals Consulted Consulted and Agree with Results and Recommendations: Patient OT Goals Acute Rehab OT Goals OT Goal Formulation: With patient Time For Goal Achievement: 7 days ADL Goals Pt Will Perform Grooming: with modified independence Pt Will Perform Lower Body Bathing: with set-up;Sit to stand from bed Pt Will Perform Lower Body Dressing: with set-up;Sit to stand from bed Pt Will Transfer to Toilet: with supervision;3-in-1  OT Evaluation Precautions/Restrictions  Precautions Precautions: Back Required Braces or Orthoses: No Restrictions Weight Bearing Restrictions: No Prior Functioning Home Living Lives With: Alone Type  of Home: House Home Layout: One level Home Access: Stairs to enter Entrance Stairs-Rails: Right Entrance Stairs-Number of Steps: 4 Bathroom Shower/Tub: Product/process development scientist: Standard Bathroom Accessibility: Yes How Accessible: Accessible via walker Home Adaptive Equipment: Bedside commode/3-in-1;Reacher;Tub transfer bench;Walker - rolling Prior Function Level of Independence: Independent with basic  ADLs;Independent with homemaking with ambulation;Independent with gait;Independent with transfers Able to Take Stairs?: Yes Driving: Yes Vocation: On disability ADL ADL Eating/Feeding: Independent Where Assessed - Eating/Feeding: Chair Grooming: Performed;Wash/dry hands;Wash/dry face;Set up;Supervision/safety Where Assessed - Grooming: Standing at sink Upper Body Bathing: Simulated;Chest;Right arm;Left arm;Abdomen;Minimal assistance Where Assessed - Upper Body Bathing: Sitting, bed Lower Body Bathing: Simulated;Maximal assistance Where Assessed - Lower Body Bathing: Sitting, bed Upper Body Dressing Details (indicate cue type and reason): With donning gown Where Assessed - Upper Body Dressing: Sitting, bed Lower Body Dressing: Simulated;Maximal assistance Lower Body Dressing Details (indicate cue type and reason): Pt. unable to cross foot over opposite knee due to pain  Where Assessed - Lower Body Dressing: Sitting, bed Toilet Transfer: Performed;Minimal assistance Toilet Transfer Details (indicate cue type and reason): Mod verbal cues for hand placement and transfer technique for safety to prevent twisting Toilet Transfer Method: Stand pivot Toilet Transfer Equipment: Bedside commode Toileting - Clothing Manipulation: Performed;Minimal assistance Toileting - Clothing Manipulation Details (indicate cue type and reason): With moving gown Where Assessed - Toileting Clothing Manipulation: Standing Toileting - Hygiene: Performed;Set up;Minimal assistance Toileting - Hygiene Details (indicate cue type and reason): Min assist to balance  Where Assessed - Toileting Hygiene: Standing Tub/Shower Transfer: Not assessed Tub/Shower Transfer Method: Not assessed Equipment Used: Rolling walker ADL Comments: Pt. with low BP initially in supine, however pt. asymptomatic. Pt. able to recall 3/3 back precautions. Vision/Perception  Vision - History Baseline Vision: No visual deficits Patient Visual  Report: No change from baseline Vision - Assessment Eye Alignment: Within Functional Limits Vision Assessment: Vision not tested    Sensation/Coordination Sensation Light Touch: Appears Intact Stereognosis: Not tested Hot/Cold: Appears Intact Proprioception: Appears Intact Extremity Assessment RUE Assessment RUE Assessment: Within Functional Limits LUE Assessment LUE Assessment: Within Functional Limits Mobility  Bed Mobility Bed Mobility: Yes Rolling Left: 4: Min assist;With rail Left Sidelying to Sit: 4: Min assist;With rails;HOB flat Transfers Transfers: Yes Sit to Stand: 4: Min assist;With armrests;With upper extremity assist;From bed;From chair/3-in-1    End of Session OT - End of Session Equipment Utilized During Treatment: Gait belt Activity Tolerance: Patient limited by pain Patient left: in chair Nurse Communication: Mobility status for transfers;Mobility status for ambulation General Behavior During Session: [facility] for tasks performed Cognition: [facility] for tasks performed   Lavona Mound, OTR/L Pager [phone] 05/10/2011, 3:10 PM

## 2011-05-11 ENCOUNTER — Encounter (HOSPITAL_COMMUNITY): Payer: Self-pay | Admitting: Orthopedic Surgery

## 2011-05-11 MED ORDER — CEPHALEXIN 500 MG PO CAPS
500.0000 mg | ORAL_CAPSULE | Freq: Four times a day (QID) | ORAL | Status: DC
  Administered 2011-05-11 – 2011-05-13 (×9): 500 mg via ORAL
  Filled 2011-05-11 (×11): qty 1

## 2011-05-11 NOTE — Progress Notes (Signed)
Subjective: Procedure(s) (LRB): LUMBAR WOUND DEBRIDEMENT (N/A) 2 Days Post-Op  Patient reports pain as moderate.  reports incisional back pain   Positive void Negative bowel movement Positive flatus Negative chest pain or shortness of breath  Objective: Vital signs in last 24 hours: Temp:  [97.7 F (36.5 C)-98.5 F (36.9 C)] 97.9 F (36.6 C) (12/05 0526) Pulse Rate:  [84-92] 84  (12/05 0526) Resp:  [18-20] 18  (12/05 0526) BP: (93-109)/(54-74) 109/74 mmHg (12/05 0526) SpO2:  [90 %-99 %] 99 % (12/05 0526)  Intake/Output from previous day: 12/04 0701 - 12/05 0700 In: 360 [P.O.:360] Out: 4 [Urine:4]   Basename 05/09/11 1158  WBC 9.4  RBC 4.35  HCT 39.5  PLT 187    Basename 05/09/11 1158  NA 139  K 3.9  CL 103  CO2 25  BUN 14  CREATININE 0.78  GLUCOSE 108*  CALCIUM 9.2   No results found for this basename: LABPT:2,INR:2 in the last 72 hours  ABD soft Neurovascular intact Sensation intact distally Intact pulses distally Incision: dressing C/D/I Compartment soft  Assessment/Plan: Patient stable  Continue mobilization with physical therapy Continue care    Jacquelynn Cree 05/11/2011, 11:19 AM

## 2011-05-11 NOTE — Progress Notes (Signed)
Physical Therapy Treatment Patient Details Name: Stephanie [name] MRN: [id number] DOB: [birth year] Today's Date: 05/11/2011  PT Assessment/Plan  PT - Assessment/Plan Comments on Treatment Session: Pt able to verbalize all precautions however requires cueing to implement with mobility. Progressing a little better today however still requiring assistance for mobility. Continue to recommend STSNF for continued safety and progressing mobility as patient lives alone PT Plan: Discharge plan remains appropriate PT Frequency: Min 5X/week Follow Up Recommendations: Skilled nursing facility Equipment Recommended: Defer to next venue PT Goals  Acute Rehab PT Goals PT Goal: Rolling Supine to Left Side - Progress: Not met PT Goal: Supine/Side to Sit - Progress: Not met PT Goal: Sit to Stand - Progress: Not met PT Goal: Stand to Sit - Progress: Not met PT Goal: Ambulate - Progress: Not met  PT Treatment Precautions/Restrictions  Precautions Precautions: Back Required Braces or Orthoses: No Restrictions Weight Bearing Restrictions: No Mobility (including Balance) Bed Mobility Rolling Left: 4: Min assist;With rail Rolling Left Details (indicate cue type and reason): A for log roll technique. Cues for positioning Left Sidelying to Sit: 4: Min assist;With rails;HOB flat Left Sidelying to Sit Details (indicate cue type and reason): A to help elevate shoulders off of bed into upright sitting. Cues for safe positioning and hand placement Transfers Transfers: Yes Sit to Stand: 4: Min assist;From bed;From chair/3-in-1;With upper extremity assist Sit to Stand Details (indicate cue type and reason): A for balance. Cues for safe hand placement.  Ambulation/Gait Ambulation/Gait Assistance: 4: Min assist Ambulation/Gait Assistance Details (indicate cue type and reason): Cues for safety with RW management. Cues for posture Ambulation Distance (Feet): 140 Feet Assistive device: Rolling walker Gait Pattern:  Step-through pattern;Decreased stride length;Antalgic Gait velocity: decreased    Exercise    End of Session PT - End of Session Equipment Utilized During Treatment: Gait belt Activity Tolerance: Patient limited by fatigue;Patient limited by pain Patient left: in chair;with call bell in reach Nurse Communication: Mobility status for transfers;Mobility status for ambulation General Behavior During Session: [facility] for tasks performed Cognition: [facility] for tasks performed  Jacqualyn Posey 05/11/2011, 11:44 AM  05/11/2011 Jacqualyn Posey PTA [phone] pager [phone] office

## 2011-05-11 NOTE — Progress Notes (Signed)
Patient improving Wound healing well No drainage  Plan:  Await SNF placement now that she is an inpatient Order to convert to inpatient done as well as d/c planning t oSNF Start qd dry dressing changes Spoke with [employer] spinal cord stim rep - she will see patient in AM to turn on stimulator which should help pain control I am out of town for the next 3 days - my partner is aware and will monitor patient progress [place] for SNF d/c once bed is available

## 2011-05-12 LAB — WOUND CULTURE

## 2011-05-12 MED ORDER — MAGNESIUM CITRATE PO SOLN
1.0000 | Freq: Once | ORAL | Status: DC
  Filled 2011-05-12: qty 296

## 2011-05-12 MED ORDER — FLEET ENEMA 7-19 GM/118ML RE ENEM
1.0000 | ENEMA | Freq: Once | RECTAL | Status: AC
  Administered 2011-05-12: 1 via RECTAL
  Filled 2011-05-12: qty 1

## 2011-05-12 NOTE — Progress Notes (Signed)
Physical Therapy Treatment Patient Details Name: Stephanie [name] MRN: [id number] DOB: [birth year] Today's Date: 05/12/2011  PT Assessment/Plan  PT - Assessment/Plan Comments on Treatment Session: Pt still has a lot of safety concerns reguarding back precautions and mobility.  PT Plan: Discharge plan remains appropriate PT Frequency: Min 5X/week Follow Up Recommendations: Skilled nursing facility Equipment Recommended: Defer to next venue PT Goals  Acute Rehab PT Goals PT Goal: Rolling Supine to Left Side - Progress: Progressing toward goal PT Goal: Sit to Stand - Progress: Progressing toward goal PT Goal: Stand to Sit - Progress: Progressing toward goal PT Goal: Ambulate - Progress: Progressing toward goal  PT Treatment Precautions/Restrictions  Precautions Precautions: Back Required Braces or Orthoses: No Restrictions Weight Bearing Restrictions: No Mobility (including Balance) Bed Mobility Rolling Left: 4: Min assist Rolling Left Details (indicate cue type and reason): A for shoulders. Cues for log roll technique Left Sidelying to Sit: 4: Min assist Left Sidelying to Sit Details (indicate cue type and reason): A with shoulders and LE. Cues to ensure no twisting.  Transfers Transfers: Yes Sit to Stand: 4: Min assist;From bed;With upper extremity assist;From chair/3-in-1 Sit to Stand Details (indicate cue type and reason): A for balance. Cues for safe hand placement Stand to Sit: 4: Min assist;To chair/3-in-1;With upper extremity assist Stand to Sit Details: Cues to sit slowly and control  Ambulation/Gait Ambulation/Gait: Yes Ambulation/Gait Assistance: Other (comment) ([employer] A) Ambulation/Gait Assistance Details (indicate cue type and reason): Cues to stay within RW. Cues to stand upright Ambulation Distance (Feet): 140 Feet Assistive device: Rolling walker Gait Pattern: Decreased stride length;Antalgic Stairs: No Wheelchair Mobility Wheelchair Mobility: No      Exercise    End of Session PT - End of Session Equipment Utilized During Treatment: Gait belt Activity Tolerance: Patient limited by pain Patient left: in chair;with call bell in reach Nurse Communication: Mobility status for transfers;Mobility status for ambulation General Behavior During Session: Other (comment) (drowsey) Cognition: [facility] for tasks performed  Kyrollos Cordell, Tonia Brooms 05/12/2011, 12:00 PM 05/12/2011 Jacqualyn Posey PTA [phone] pager [phone] office

## 2011-05-12 NOTE — Progress Notes (Signed)
Subjective: Procedure(s) (LRB): LUMBAR WOUND DEBRIDEMENT (N/A) 3 Days Post-Op  Patient reports pain as mild.  Reports incisional back pain   Positive void Negative bowel movement Positive flatus Negative chest pain or shortness of breath  Objective: Vital signs in last 24 hours: Temp:  [97.6 F (36.4 C)-97.7 F (36.5 C)] 97.7 F (36.5 C) (12/06 0630) Pulse Rate:  [78-93] 78  (12/06 0630) Resp:  [16-17] 16  (12/06 0630) BP: (101-116)/(67-75) 113/75 mmHg (12/06 0630) SpO2:  [97 %-100 %] 97 % (12/06 0630)  Intake/Output from previous day: 12/05 0701 - 12/06 0700 In: 660 [P.O.:660] Out: 4 [Urine:4]   Basename 05/09/11 1158  WBC 9.4  RBC 4.35  HCT 39.5  PLT 187    Basename 05/09/11 1158  NA 139  K 3.9  CL 103  CO2 25  BUN 14  CREATININE 0.78  GLUCOSE 108*  CALCIUM 9.2   No results found for this basename: LABPT:2,INR:2 in the last 72 hours  ABD soft Neurovascular intact Sensation intact distally Intact pulses distally Incision: dressing C/D/I Compartment soft  Assessment/Plan: Patient stable Reports feeling much better today Continue mobilization with physical therapy Constipation: Fleet enema and magnesium citrate today Continue care DC to SNF today pending bed availability   Jacquelynn Cree 05/12/2011, 7:38 AM

## 2011-05-13 ENCOUNTER — Other Ambulatory Visit: Payer: Self-pay | Admitting: *Deleted

## 2011-05-13 IMAGING — CT CT CHEST W/O CM
2 of 3 series · 14 of 31 positions shown, 16 images · non-contrast
Comparison: 03/11/2010

CLINICAL DATA: Evaluate lung lesion

CT CHEST WITHOUT CONTRAST
TECHNIQUE: Multidetector CT imaging of the chest was performed
following the standard protocol without IV contrast.

[Series 3: routine chest · axial · 0.62mm/px · z∈[-282,-107]mm · 6 of 50 slices shown, 8 images]
[im 8/50  mediastinal]
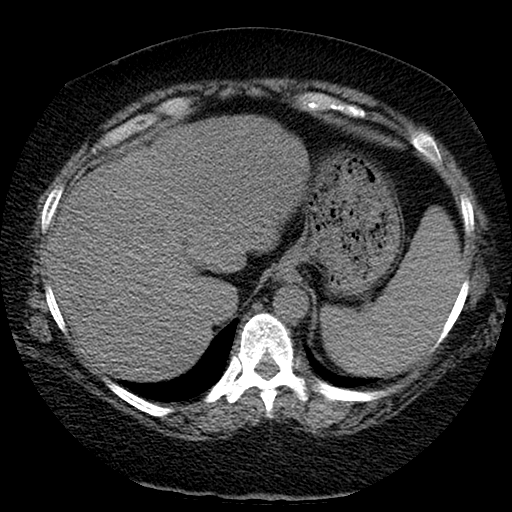
[im 8/50  lung]
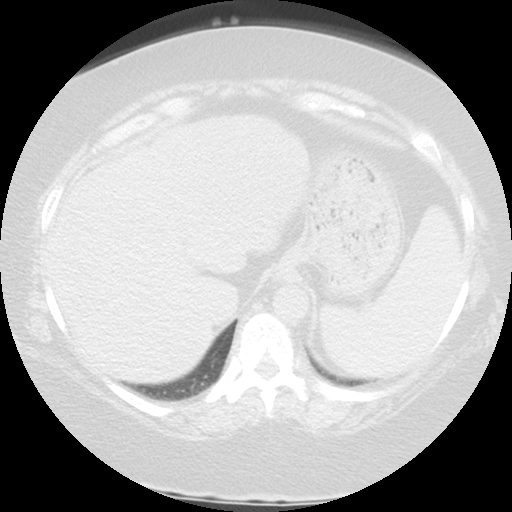
[im 15/50  lung]
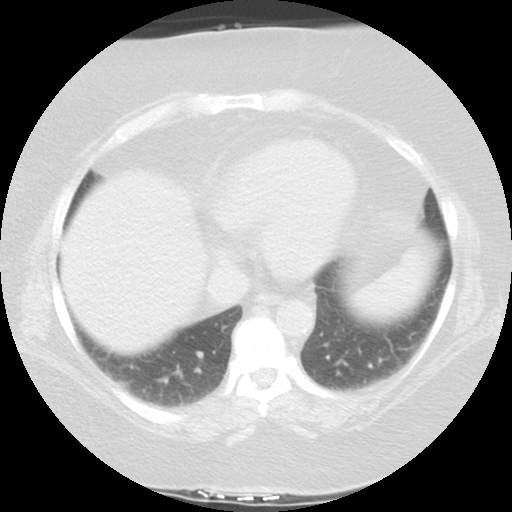
[im 22/50  lung]
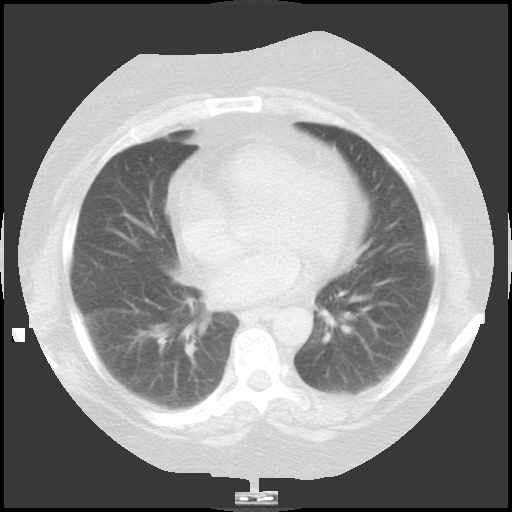
[im 29/50  lung]
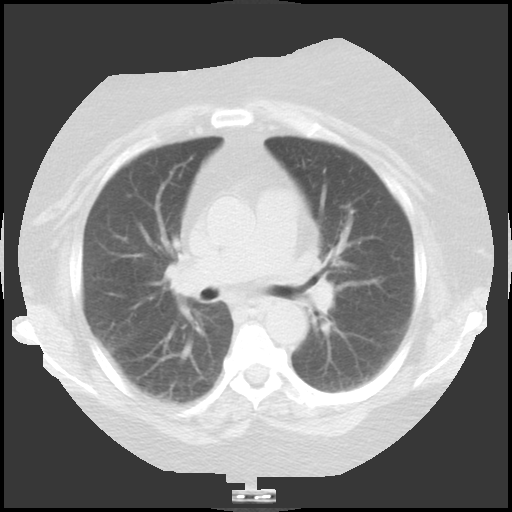
[im 36/50  mediastinal]
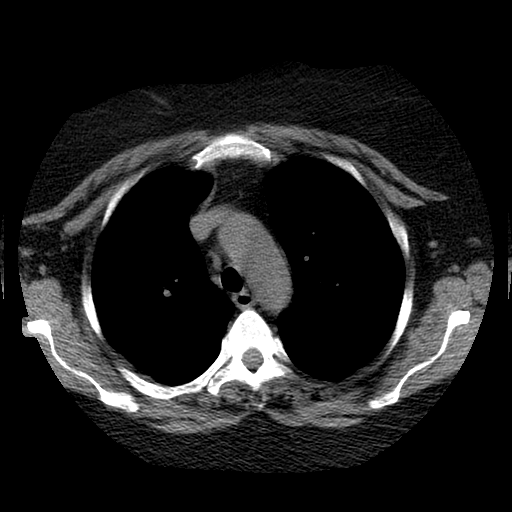
[im 36/50  lung]
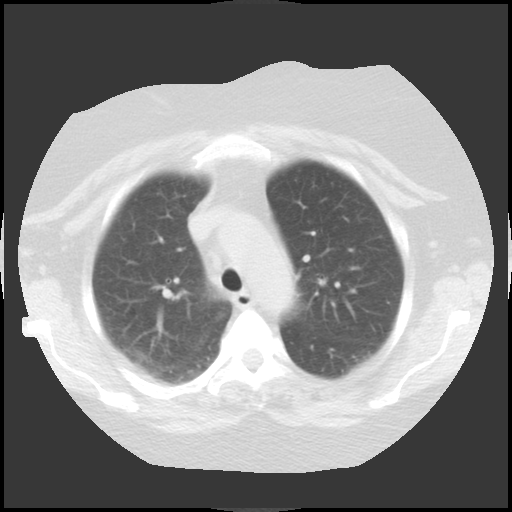
[im 43/50  lung]
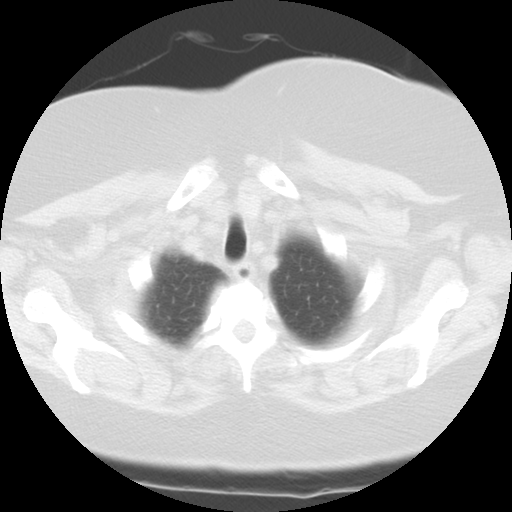

[Series 602: sagittal body · sagittal · 0.62mm/px · 8 of 129 slices shown]
[im 14/129  mediastinal]
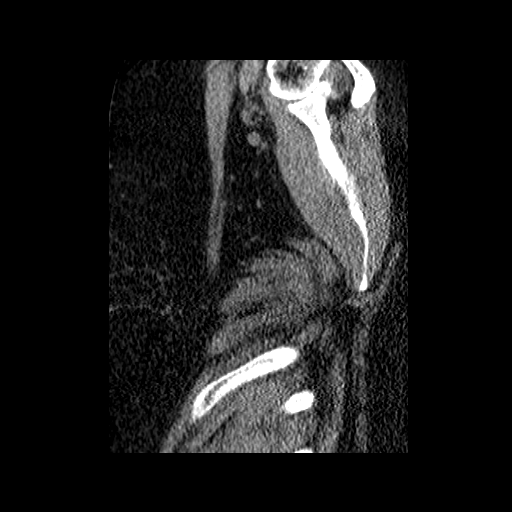
[im 27/129  mediastinal]
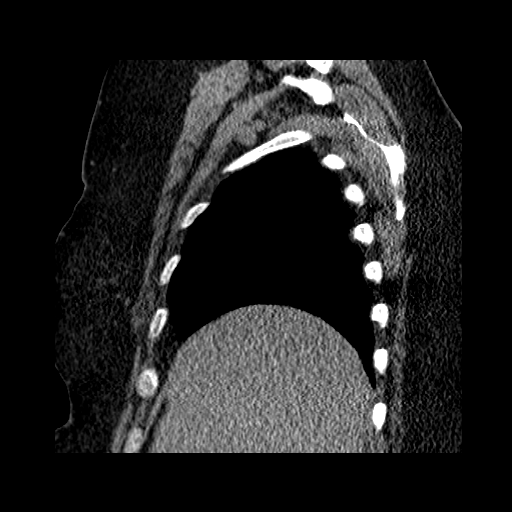
[im 41/129  mediastinal]
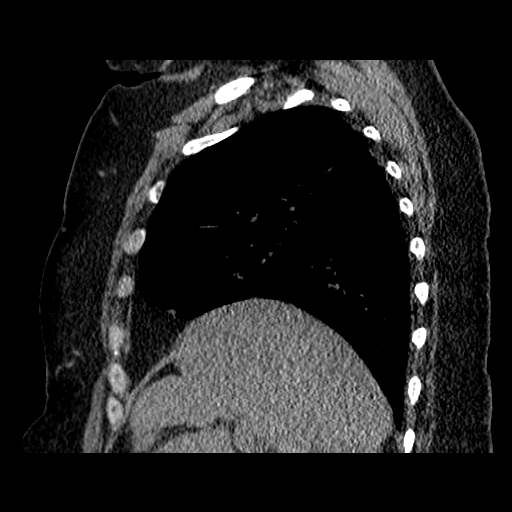
[im 61/129  mediastinal]
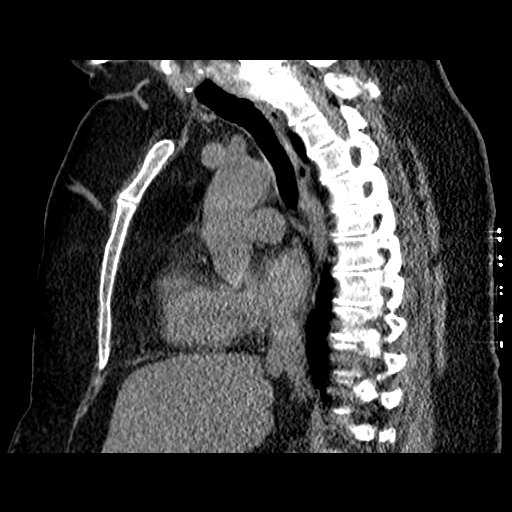
[im 68/129  mediastinal]
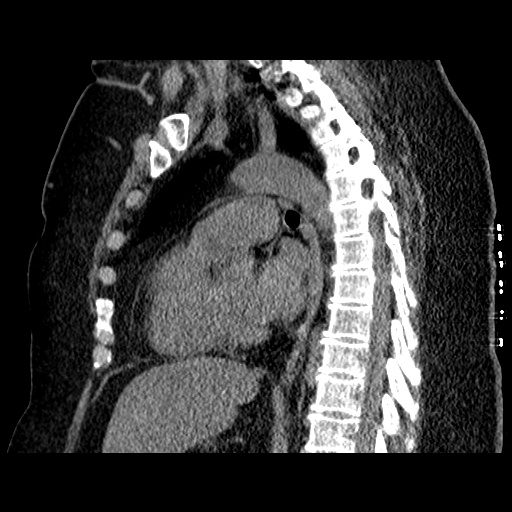
[im 88/129  mediastinal]
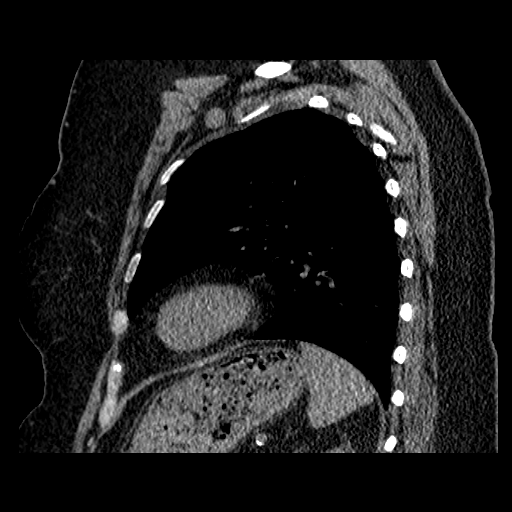
[im 102/129  mediastinal]
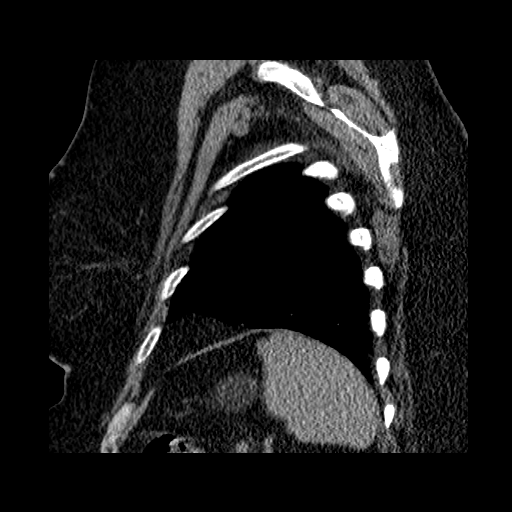
[im 115/129  mediastinal]
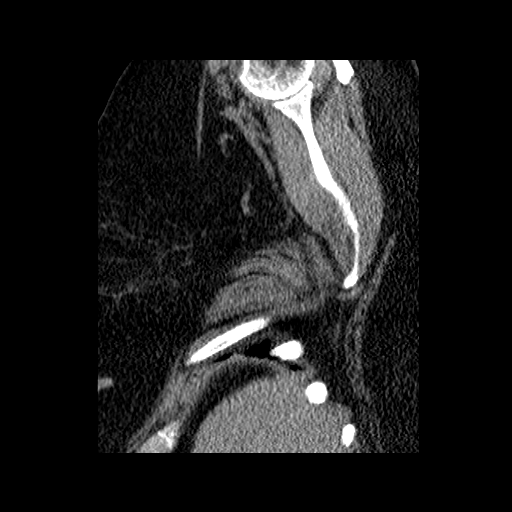

[14 of 31 positions shown; findings below may reference images not displayed]

FINDINGS: No enlarged axillary or supraclavicular lymph nodes.
There is no enlarged mediastinal or hilar lymph nodes.  There is no
pericardial or pleural effusion identified.

The trachea appears patent and is midline.

Within the right upper lobe there is a pulmonary nodule which
measures 5.3 mm, image number 10.  Unchanged from previous exam.

No new pulmonary parenchymal nodules or masses identified.

There are no worrisome lytic or sclerotic lesions identified.

Limited imaging through the upper abdomen shows no acute findings,
mass or adenopathy.
IMPRESSION: 1.  Stable CT of the chest.  Pulmonary nodule in the right upper
lobe is unchanged measuring 5 mm.  This represents a period of
stability of 6 months.  In a low risk patient documented stability
for 12 months is suggested.  If the patient is at increased risk
for developing bronchogenic carcinoma then documented stability for
18-24 months is advised. This recommendation follows the consensus
statement: Guidelines for Management of Small Pulmonary Nodules
Detected on CT Scans:  A Statement from the [HOSPITAL] as
[URL]

## 2011-05-13 MED ORDER — ESTRADIOL 0.1 MG/24HR TD PTTW: 1.0000 | MEDICATED_PATCH | TRANSDERMAL | Status: DC

## 2011-05-13 MED ORDER — CEPHALEXIN 500 MG PO CAPS: 500.0000 mg | ORAL_CAPSULE | Freq: Four times a day (QID) | ORAL | Status: AC

## 2011-05-13 MED ORDER — MAGNESIUM CITRATE PO SOLN
1.0000 | Freq: Once | ORAL | Status: AC
  Administered 2011-05-13: 1 via ORAL
  Filled 2011-05-13: qty 296

## 2011-05-13 NOTE — Progress Notes (Signed)
Patient seen  Yesterday for SNF. She is agreeable and bed confirmed at [facility] for today- MD: FL2 placed in shadow chart in [facility] [address] for your review and signature. Await MD for d/c summary-  Eduard Clos, MSW, [place]

## 2011-05-13 NOTE — Progress Notes (Signed)
Physical Therapy Treatment Patient Details Name: Stephanie [name] MRN: [id number] DOB: 1950/09/16 Today's Date: 05/13/2011  PT Assessment/Plan  PT - Assessment/Plan Comments on Treatment Session: Pt still requiring cues for back precautions with mobility although safety awareness was a lot better today PT Plan: Discharge plan remains appropriate PT Frequency: 7X/week Follow Up Recommendations: Skilled nursing facility Equipment Recommended: Defer to next venue PT Goals  Acute Rehab PT Goals PT Goal: Rolling Supine to Left Side - Progress: Progressing toward goal PT Goal: Supine/Side to Sit - Progress: Progressing toward goal PT Goal: Sit to Stand - Progress: Progressing toward goal PT Goal: Stand to Sit - Progress: Progressing toward goal PT Goal: Ambulate - Progress: Progressing toward goal  PT Treatment Precautions/Restrictions  Precautions Precautions: Back Required Braces or Orthoses: No Restrictions Weight Bearing Restrictions: No Mobility (including Balance) Bed Mobility Rolling Left: 5: Supervision;With rail Rolling Left Details (indicate cue type and reason): Cues for log roll technique to avoid twisting.  Left Sidelying to Sit: 5: Supervision;With rails Left Sidelying to Sit Details (indicate cue type and reason): Cues for safe hand placement and positioning of hands to push up Transfers Sit to Stand: 5: Supervision Sit to Stand Details (indicate cue type and reason): Cues for safe hand placement Stand to Sit: 5: Supervision Stand to Sit Details: Cues for safe hand placement and to back all the way back to chair prior to sitting.  Ambulation/Gait Ambulation/Gait Assistance: 5: Supervision Ambulation/Gait Assistance Details (indicate cue type and reason): Cues for RW management and positioning inside of RW Ambulation Distance (Feet): 150 Feet Assistive device: Rolling walker Gait Pattern: Step-to pattern;Decreased stride length    Exercise    End of Session PT -  End of Session Equipment Utilized During Treatment: Gait belt Activity Tolerance: Patient tolerated treatment well Patient left: in chair;with call bell in reach;with family/visitor present Nurse Communication: Mobility status for transfers;Mobility status for ambulation General Behavior During Session: [facility] for tasks performed Cognition: [facility] for tasks performed  Watt Geiler, Tonia Brooms 05/13/2011, 12:13 PM

## 2011-05-13 NOTE — Telephone Encounter (Signed)
Rx was faxed to [facility].

## 2011-05-13 NOTE — Progress Notes (Signed)
Subjective: Procedure(s) (LRB): LUMBAR WOUND DEBRIDEMENT (N/A) 4 Days Post-Op  Patient reports pain as mild.   Positive void Positive bowel movement Positive flatus Negative chest pain or shortness of breath  Objective: Vital signs in last 24 hours: Temp:  [97.8 F (36.6 C)-97.9 F (36.6 C)] 97.9 F (36.6 C) (12/07 0535) Pulse Rate:  [90-95] 90  (12/07 0535) Resp:  [18] 18  (12/07 0535) BP: (112-114)/(67-77) 112/77 mmHg (12/07 0535) SpO2:  [94 %] 94 % (12/07 0535)  Intake/Output from previous day: 12/06 0701 - 12/07 0700 In: 400 [P.O.:400] Out: -   No results found for this basename: WBC:2,RBC:2,HCT:2,PLT:2 in the last 72 hours No results found for this basename: NA:2,K:2,CL:2,CO2:2,BUN:2,CREATININE:2,GLUCOSE:2,CALCIUM:2 in the last 72 hours No results found for this basename: LABPT:2,INR:2 in the last 72 hours  ABD soft Neurovascular intact Sensation intact distally Intact pulses distally Incision: dressing C/D/I Compartment soft  Assessment/Plan: Patient stable  Continue care  Dressings changed Patient with small BM yesterday following Fleet enema, will do mag citrate today Patient with bed assignment; discharge to SNF today  Jacquelynn Cree 05/13/2011, 1:39 PM

## 2011-05-13 NOTE — Progress Notes (Signed)
Bed offer received and accepted by patient to [facility]. Ok per MD for d/c to SNF today via EMS. DC arrangements completed- -patient is pleased with d/c plan. Notified pt's nurse of above. Will require prior approval per EDS for Medicaid auth and forward number to facility.  Williemae Area, BSW, 05/13/2011 2:36 PM

## 2011-05-14 LAB — ANAEROBIC CULTURE

## 2011-05-16 ENCOUNTER — Other Ambulatory Visit: Payer: Self-pay | Admitting: Thoracic Surgery

## 2011-05-16 DIAGNOSIS — D381 Neoplasm of uncertain behavior of trachea, bronchus and lung: Secondary | ICD-10-CM

## 2011-05-19 ENCOUNTER — Other Ambulatory Visit: Payer: Self-pay | Admitting: *Deleted

## 2011-05-19 MED ORDER — CLONAZEPAM 1 MG PO TABS: 1.0000 mg | ORAL_TABLET | Freq: Two times a day (BID) | ORAL | Status: DC | PRN

## 2011-05-19 NOTE — Telephone Encounter (Signed)
Last refill 03/30/2011.

## 2011-05-20 NOTE — Telephone Encounter (Signed)
[facility] called [facility].

## 2011-05-23 ENCOUNTER — Encounter: Payer: Self-pay | Admitting: *Deleted

## 2011-06-21 ENCOUNTER — Other Ambulatory Visit: Payer: Self-pay | Admitting: *Deleted

## 2011-06-21 MED ORDER — OMEPRAZOLE 40 MG PO CPDR: 40.0000 mg | DELAYED_RELEASE_CAPSULE | Freq: Every day | ORAL | Status: DC

## 2011-06-22 ENCOUNTER — Ambulatory Visit (INDEPENDENT_AMBULATORY_CARE_PROVIDER_SITE_OTHER): Payer: Medicare Other | Admitting: Thoracic Surgery

## 2011-06-22 ENCOUNTER — Ambulatory Visit
Admission: RE | Admit: 2011-06-22 | Discharge: 2011-06-22 | Disposition: A | Discharge status: Home / Self Care | Payer: Medicare Other | Source: Ambulatory Visit | Attending: Thoracic Surgery | Admitting: Thoracic Surgery

## 2011-06-22 ENCOUNTER — Encounter: Payer: Self-pay | Admitting: Thoracic Surgery

## 2011-06-22 VITALS — BP 104/66 | HR 92 | Resp 20 | Ht 64.0 in | Wt 191.0 lb

## 2011-06-22 DIAGNOSIS — R918 Other nonspecific abnormal finding of lung field: Secondary | ICD-10-CM | POA: Diagnosis not present

## 2011-06-22 DIAGNOSIS — D381 Neoplasm of uncertain behavior of trachea, bronchus and lung: Secondary | ICD-10-CM

## 2011-06-22 DIAGNOSIS — I7100 Dissection of unspecified site of aorta: Secondary | ICD-10-CM

## 2011-06-22 DIAGNOSIS — J984 Other disorders of lung: Secondary | ICD-10-CM | POA: Diagnosis not present

## 2011-06-22 DIAGNOSIS — R05 Cough: Secondary | ICD-10-CM | POA: Diagnosis not present

## 2011-06-22 NOTE — Progress Notes (Signed)
HPI the patient returns for followup today. CT scan shows that the right upper lobe apical lesion is unchanged. This was thought to be possibly inflammatory. However he was recommended that a one more followup be obtained. We will see her back again in one year for final CT scan. Dr. Roxan Hockey will review.  Current Outpatient Prescriptions  Medication Sig Dispense Refill  . ABILIFY 30 MG tablet TAKE 1 TABLET BY MOUTH EVERY MORNING  30 tablet  0  . celecoxib (CELEBREX) 200 MG capsule Take 200 mg by mouth daily.      . cyclobenzaprine (FLEXERIL) 10 MG tablet Take one tablet by mouth twice daily as needed for back pain  90 tablet  0  . docusate sodium (COLACE) 100 MG capsule Take 100 mg by mouth 2 (two) times daily as needed. For constipation       . estradiol (VIVELLE-DOT) 0.1 MG/24HR Place 1 patch (0.1 mg total) onto the skin 2 (two) times a week.  24 patch  12  . HYDROcodone-acetaminophen (NORCO) 10-325 MG per tablet Take 1 tablet by mouth. Every 6-8 hours as needed for pain       . lamoTRIgine (LAMICTAL) 100 MG tablet Take 3 tablets by mouth once daily      . metFORMIN (GLUCOPHAGE-XR) 500 MG 24 hr tablet Take 1 tablet (500 mg total) by mouth daily with breakfast.  30 tablet  6  . mirtazapine (REMERON) 15 MG tablet Take 15 mg by mouth at bedtime.      Marland Kitchen nystatin (MYCOSTATIN) powder Apply topically 2 (two) times daily.  15 g  0  . omeprazole (PRILOSEC) 40 MG capsule Take 1 capsule (40 mg total) by mouth daily.  90 capsule  3  . ondansetron (ZOFRAN) 4 MG tablet Take 4 mg by mouth every 8 (eight) hours as needed.      . promethazine (PHENERGAN) 25 MG tablet Take 1/2-1 by mouth every 8 hours as needed for nausea       . QUEtiapine (SEROQUEL) 300 MG tablet Take 300 mg by mouth 3 (three) times daily.        . ropinirole (REQUIP) 5 MG tablet Take 5 mg by mouth at bedtime.        Marland Kitchen zolpidem (AMBIEN) 10 MG tablet Take 10 mg by mouth at bedtime as needed. For sleep         Review of Systems: Unchanged  has had a spinal stimulator inserted   Physical Exam lungs are clear to auscultation percussion   Diagnostic Tests: CT scan showed that the right upper lobe apical lesion was stable  Impression: Right upper lobe lesion   Plan: Followup one year by Dr. Roxan Hockey

## 2011-06-24 ENCOUNTER — Other Ambulatory Visit: Payer: Self-pay | Admitting: *Deleted

## 2011-06-24 MED ORDER — CYCLOBENZAPRINE HCL 10 MG PO TABS: ORAL_TABLET | ORAL | Status: DC

## 2011-06-27 ENCOUNTER — Other Ambulatory Visit: Payer: Self-pay | Admitting: *Deleted

## 2011-06-27 MED ORDER — DOCUSATE SODIUM 100 MG PO CAPS: 100.0000 mg | ORAL_CAPSULE | Freq: Two times a day (BID) | ORAL | Status: DC | PRN

## 2011-07-08 ENCOUNTER — Ambulatory Visit: Payer: Medicare Other | Admitting: Family Medicine

## 2011-07-11 ENCOUNTER — Encounter: Payer: Self-pay | Admitting: Family Medicine

## 2011-07-11 ENCOUNTER — Ambulatory Visit (INDEPENDENT_AMBULATORY_CARE_PROVIDER_SITE_OTHER): Payer: Medicare Other | Admitting: Family Medicine

## 2011-07-11 VITALS — BP 120/69 | HR 68 | Temp 97.5°F | Wt 183.5 lb

## 2011-07-11 DIAGNOSIS — F411 Generalized anxiety disorder: Secondary | ICD-10-CM | POA: Diagnosis not present

## 2011-07-11 DIAGNOSIS — F419 Anxiety disorder, unspecified: Secondary | ICD-10-CM

## 2011-07-11 DIAGNOSIS — R739 Hyperglycemia, unspecified: Secondary | ICD-10-CM

## 2011-07-11 DIAGNOSIS — R7309 Other abnormal glucose: Secondary | ICD-10-CM

## 2011-07-11 MED ORDER — NYSTATIN 100000 UNIT/GM EX POWD: Freq: Two times a day (BID) | CUTANEOUS | Status: DC

## 2011-07-11 MED ORDER — CLONAZEPAM 0.5 MG PO TABS: 0.5000 mg | ORAL_TABLET | Freq: Two times a day (BID) | ORAL | Status: DC | PRN

## 2011-07-11 NOTE — Patient Instructions (Signed)
Good to see you. Please keep me posted with your symptoms.

## 2011-07-11 NOTE — Progress Notes (Signed)
Subjective:    Patient ID: Stephanie [name], female    DOB: 04-05-1951, 61 y.o.   MRN: [id number]  HPI  61 yo with complicated medical history here for hyperglycemia and panic attacks.  Hyperglycemia- concerned about her blood sugar because she was told during her surgery in December that her CBGs were elevated. On Metformin 500 mg daily. Denies any increased thirst or urination. No FH of DM. Lab Results  Component Value Date   HGBA1C 5.8 03/25/2011     Panic attacks- weaned herself off klonipin because behavioral health told her that they would not refill it.  Was doing ok until her Surgery. Now she is having almost weekly panic attacks. Taking her Lamictal and seroquel as directed. No SI or HI.  Patient Active Problem List  Diagnoses  . HEPATITIS B, CHRONIC  . Benign Neoplasm of Colon  . UNSPECIFIED VITAMIN D DEFICIENCY  . HYPERLIPIDEMIA  . TOBACCO ABUSE  . OBSTRUCTIVE SLEEP APNEA  . RESTLESS LEGS SYNDROME  . DISORDER, NERVOUS SYSTEM NOS  . RHEUMATIC FEVER  . HYPERTENSION  . RHINITIS  . COPD  . SMALL BOWEL OBSTRUCTION  . OTHER CONSTIPATION  . CELLULITIS AND ABSCESS OF OTHER SPECIFIED SITE  . FOLLICULITIS  . SKIN LESION  . HIP PAIN  . BACK PAIN, LUMBAR, CHRONIC  . SCIATICA, LEFT  . INSOMNIA  . EDEMA, LOCALIZED  . NAUSEA ALONE  . INCONTINENCE  . ABDOMINAL PAIN  . PELVIC PAIN, ACUTE  . MAMMOGRAM, ABNORMAL, RIGHT  . UNS ADVRS EFF OTH RX MEDICINAL&BIOLOGICAL SBSTNC  . BIPOLAR AFFECTIVE DISORDER, HX OF  . PERSONAL HISTORY OF COLONIC POLYPS  . DYSURIA  . INTERTRIGO, CANDIDAL  . RIB PAIN, RIGHT SIDED  . Routine general medical examination at a health care facility  . Hyperglycemia  . Localized swelling, mass, or lump of lower extremity  . Preop examination  . Aortic dissection  . Anxiety   Past Medical History  Diagnosis Date  . Adenomatous colon polyp   . Insomnia   . RLS (restless legs syndrome)   . Colon polyp   . Incontinence   . Hepatitis B,  chronic   . H/O: rheumatic fever   . Hyperlipidemia   . COPD (chronic obstructive pulmonary disease)   . Unspecified disorders of nervous system   . Bipolar affective disorder     h/o  . H/O: CVA (cardiovascular accident)     TIA  . Fatty liver 04/09/08    found in abd CT  . Shortness of breath   . Obstructive sleep apnea     not using CPAP, last sleep study 2.5 years ago, per pt doctor is aware  . Blood transfusion 2000  . GERD (gastroesophageal reflux disease)   . Arthritis   . Aortic dissection     Type 1   Past Surgical History  Procedure Date  . Back surgery   . Cholecystectomy   . Rotator cuff repair   . Abdominal hysterectomy     total  . Lumbar fusion 10/09  . Hemorroidectomy     and colon polyp removed  . Abdominal exploration surgery   . Posterior cervical fusion/foraminotomy   . Eye surgery     bilat  . Lumbar wound debridement 05/09/2011    Procedure: LUMBAR WOUND DEBRIDEMENT;  Surgeon: Dahlia Bailiff;  Location: [facility];  Service: Orthopedics;  Laterality: N/A;  IRRIGATION AND DEBRIDEMENT SPINAL WOUND  . Axillary artery cannulation via 8-mm hemashield graft, median sternotomy, extracorporeal circulation with  deep hypothermic circulatory arrest, repair of  aortic dessection 01/23/2009    Dr Arlyce Dice   History  Substance Use Topics  . Smoking status: Current Everyday Smoker -- 0.5 packs/day  . Smokeless tobacco: Not on file  . Alcohol Use: No   Family History  Problem Relation Age of Onset  . Colon cancer Neg Hx   . Breast cancer Mother   . Lung cancer Mother   . Stomach cancer Father   . Diabetes      4 aunts, and 1 uncle   No Known Allergies Current Outpatient Prescriptions on File Prior to Visit  Medication Sig Dispense Refill  . ABILIFY 30 MG tablet TAKE 1 TABLET BY MOUTH EVERY MORNING  30 tablet  0  . celecoxib (CELEBREX) 200 MG capsule Take 200 mg by mouth daily.      . cyclobenzaprine (FLEXERIL) 10 MG tablet Take one tablet by mouth twice daily  as needed for back pain  90 tablet  0  . docusate sodium (COLACE) 100 MG capsule Take 1 capsule (100 mg total) by mouth 2 (two) times daily as needed. For constipation  60 capsule  2  . estradiol (VIVELLE-DOT) 0.1 MG/24HR Place 1 patch (0.1 mg total) onto the skin 2 (two) times a week.  24 patch  12  . HYDROcodone-acetaminophen (NORCO) 10-325 MG per tablet Take 1 tablet by mouth. Every 6-8 hours as needed for pain       . lamoTRIgine (LAMICTAL) 100 MG tablet Take 3 tablets by mouth once daily      . metFORMIN (GLUCOPHAGE-XR) 500 MG 24 hr tablet Take 1 tablet (500 mg total) by mouth daily with breakfast.  30 tablet  6  . mirtazapine (REMERON) 15 MG tablet Take 15 mg by mouth at bedtime.      Marland Kitchen nystatin (MYCOSTATIN) powder Apply topically 2 (two) times daily.  15 g  0  . omeprazole (PRILOSEC) 40 MG capsule Take 1 capsule (40 mg total) by mouth daily.  90 capsule  3  . ondansetron (ZOFRAN) 4 MG tablet Take 4 mg by mouth every 8 (eight) hours as needed.      . promethazine (PHENERGAN) 25 MG tablet Take 1/2-1 by mouth every 8 hours as needed for nausea       . QUEtiapine (SEROQUEL) 300 MG tablet Take 300 mg by mouth 3 (three) times daily.        . ropinirole (REQUIP) 5 MG tablet Take 5 mg by mouth at bedtime.        Marland Kitchen zolpidem (AMBIEN) 10 MG tablet Take 10 mg by mouth at bedtime as needed. For sleep       The PMH, PSH, Social History, Family History, Medications, and allergies have been reviewed in [facility], and have been updated if relevant.   Review of Systems SEE HPI  No nausea, vomiting or diarrhea.    Objective:   Physical Exam BP 120/69  Pulse 68  Temp(Src) 97.5 F (36.4 C) (Oral)  Wt 183 lb 8 oz (83.235 kg) Gen:  Alert, pleasant, NAD Resp:  CTA bilaterally CVS:  RRR Ext:  No edema    Assessment & Plan:   1. Anxiety  Deteriorated. Rx given for Clonazepam 0.5 mg twice daily as needed.   2. Hyperglycemia  Has been stable. Recheck a1c today. HgB A1c

## 2011-07-13 ENCOUNTER — Other Ambulatory Visit: Payer: Self-pay | Admitting: Family Medicine

## 2011-07-13 MED ORDER — PROMETHAZINE HCL 25 MG PO TABS: ORAL_TABLET | ORAL | Status: DC

## 2011-07-13 NOTE — Telephone Encounter (Signed)
Patient advised as instructed via telephone, [facility].

## 2011-07-13 NOTE — Telephone Encounter (Signed)
Promethazine 25 mg 1 1/2 to 2 daily. Pt is wanting to know if Dr. Deborra Medina could prescibe this medication for her. Dr. Deatra Ina aas previously prescibing it but he suggested that she call her primary to have it refilled. Pt states that she uses it because she takes so many medications that she uses this nausea pill to settle her stomach from all the medication. [facility].

## 2011-07-16 DIAGNOSIS — M961 Postlaminectomy syndrome, not elsewhere classified: Secondary | ICD-10-CM | POA: Diagnosis not present

## 2011-07-16 DIAGNOSIS — G894 Chronic pain syndrome: Secondary | ICD-10-CM | POA: Diagnosis not present

## 2011-07-28 DIAGNOSIS — F312 Bipolar disorder, current episode manic severe with psychotic features: Secondary | ICD-10-CM | POA: Diagnosis not present

## 2011-08-09 ENCOUNTER — Telehealth: Payer: Self-pay | Admitting: Family Medicine

## 2011-08-09 NOTE — Telephone Encounter (Signed)
Advised patient

## 2011-08-09 NOTE — Telephone Encounter (Signed)
All medications for constipation that I am aware of and that I would feel comfortable recommending are OTC. Those would include Miralax, Senna, etc. I would recommending calling her GI doctor, Dr. Deatra Ina for further recommendations beyond that.

## 2011-08-09 NOTE — Telephone Encounter (Signed)
Left message on machine asking pt to call back.

## 2011-08-09 NOTE — Telephone Encounter (Signed)
Patient called the office asking to speak with Dr. Deborra Medina in regards to a new prescription. She stated that from her last visit on 07/11/11, she has had constipation. She has tried to use stool softner and other over the counter medication but it doesn't work. She wants Dr. Deborra Medina to call in a prescription for her. Best number to reach patient is [phone]

## 2011-08-16 ENCOUNTER — Other Ambulatory Visit: Payer: Self-pay | Admitting: Family Medicine

## 2011-08-17 ENCOUNTER — Other Ambulatory Visit: Payer: Self-pay | Admitting: *Deleted

## 2011-08-17 NOTE — Telephone Encounter (Signed)
[facility] for refill on clonazepam .5 mg's, last filled on 07/11/11.  This is no longer on pt's med list.

## 2011-08-17 NOTE — Telephone Encounter (Signed)
Please verify how she is taking this with pt.

## 2011-08-18 ENCOUNTER — Other Ambulatory Visit: Payer: Self-pay | Admitting: *Deleted

## 2011-08-18 MED ORDER — CLONAZEPAM 0.5 MG PO TABS: 0.5000 mg | ORAL_TABLET | Freq: Two times a day (BID) | ORAL | Status: DC | PRN

## 2011-08-18 NOTE — Telephone Encounter (Signed)
[facility]

## 2011-08-18 NOTE — Telephone Encounter (Signed)
Please phone in as entered below.

## 2011-08-18 NOTE — Telephone Encounter (Signed)
Spoke with pt, she takes one 2 times a day.  She says she was given this the last time she was here.

## 2011-08-19 ENCOUNTER — Other Ambulatory Visit: Payer: Self-pay | Admitting: Family Medicine

## 2011-09-07 IMAGING — US US EXTREM LOW VENOUS*R*
1 series · 17 of 24 positions shown · non-contrast
Comparison: none

REASON FOR EXAM: CR   [id number]  rt leg swelling pain  eval DVT
COMMENTS:

[Series 1: us extrem low venous*right* · 17 of 37 slices shown]
[im 1/37]
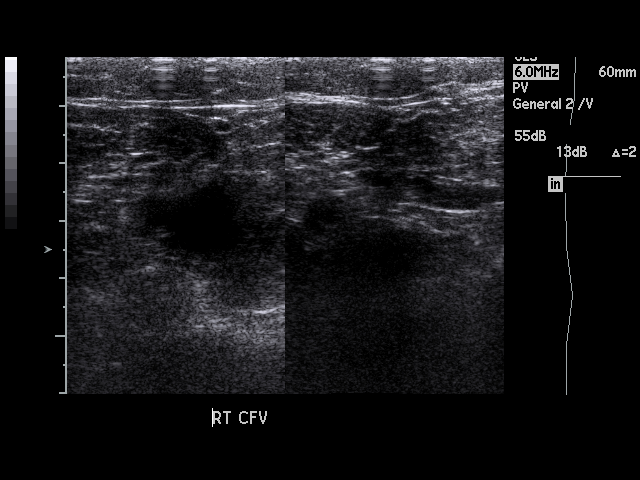
[im 4/37]
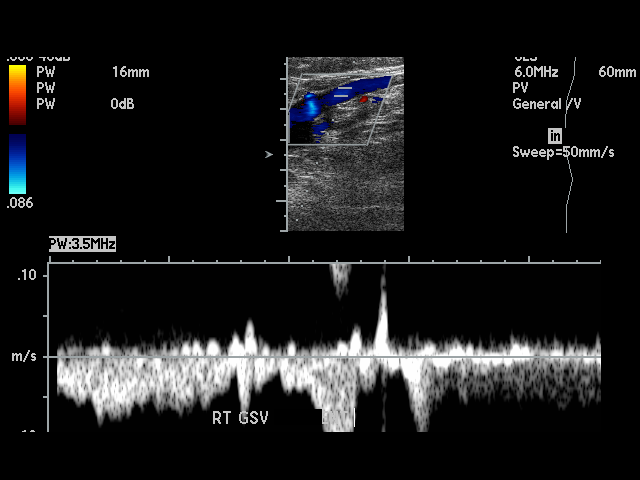
[im 5/37]
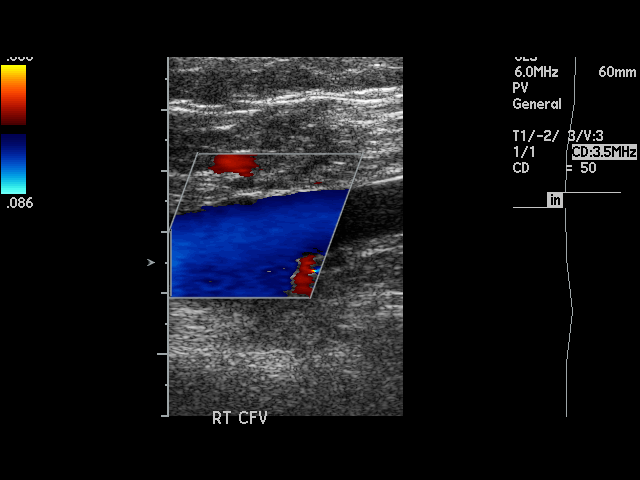
[im 7/37]
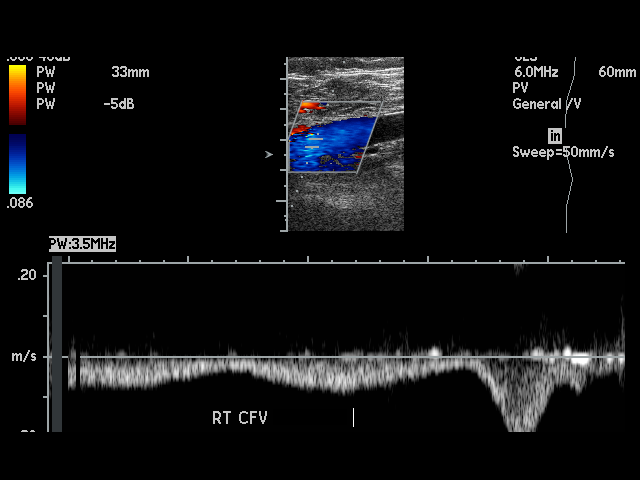
[im 10/37]
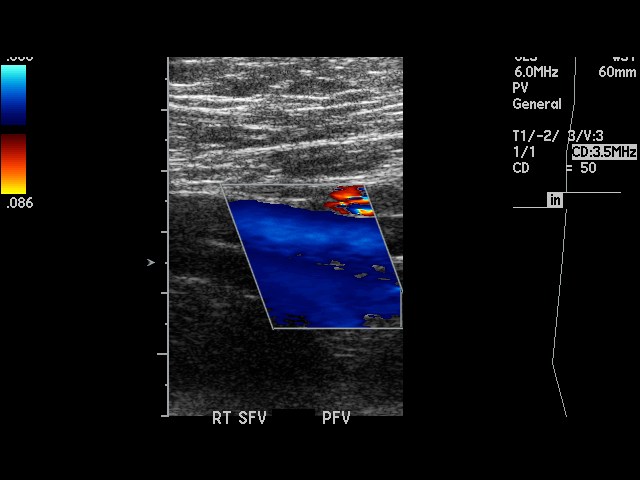
[im 11/37]
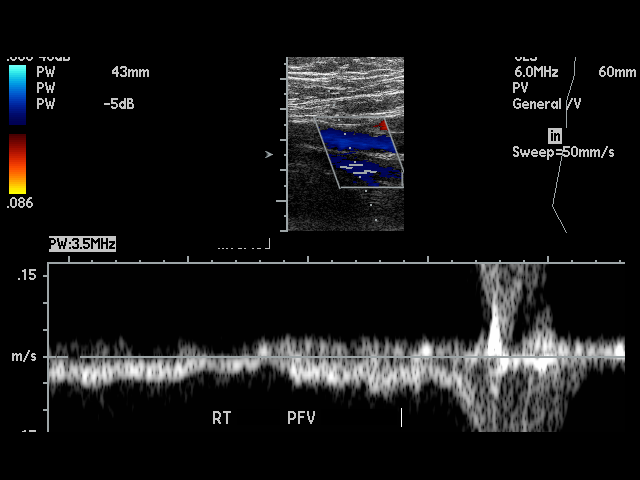
[im 15/37]
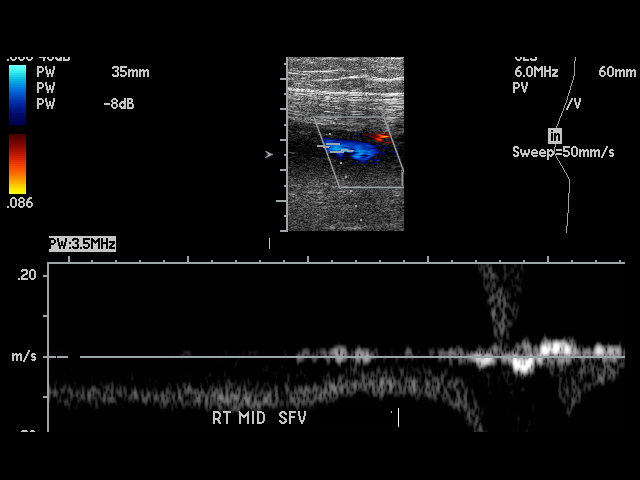
[im 16/37]
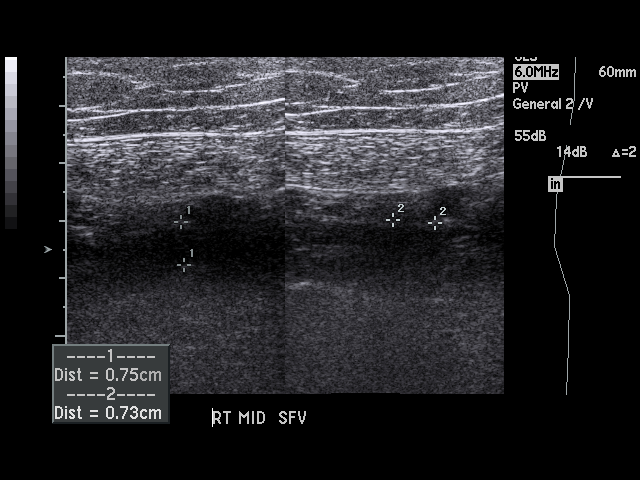
[im 19/37]
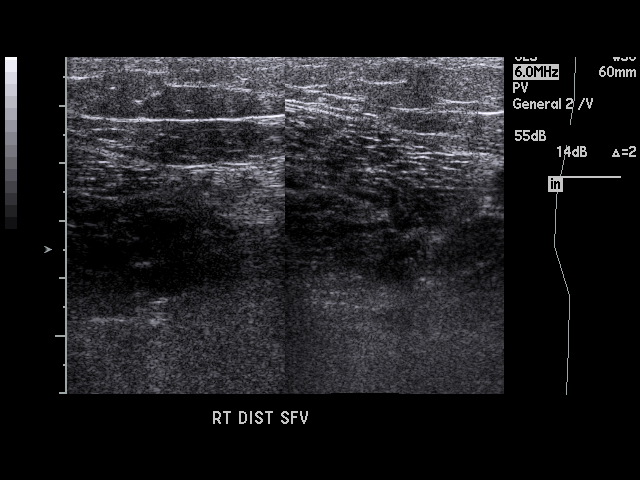
[im 21/37]
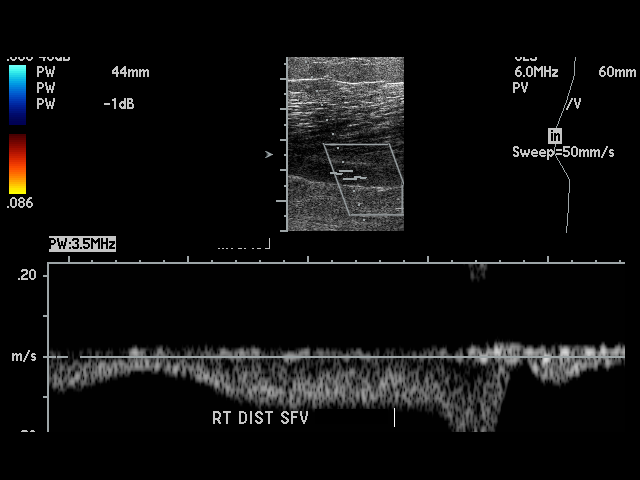
[im 22/37]
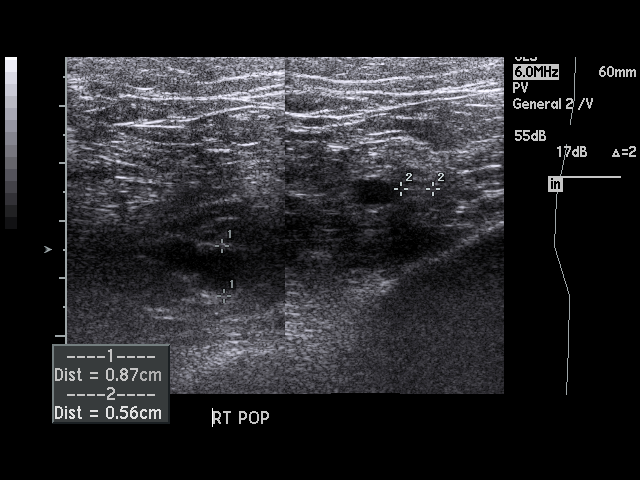
[im 26/37]
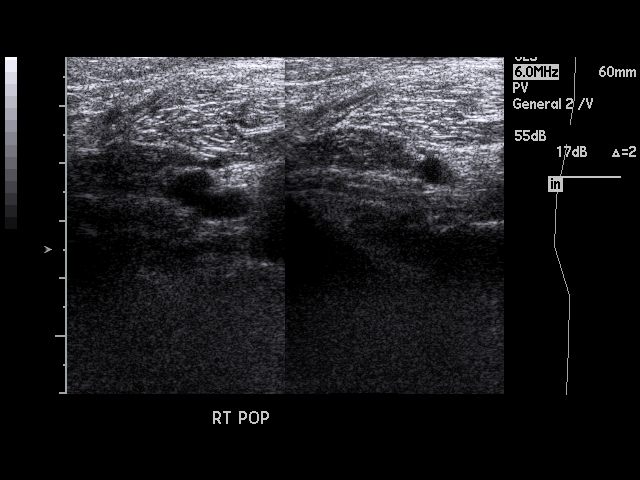
[im 27/37]
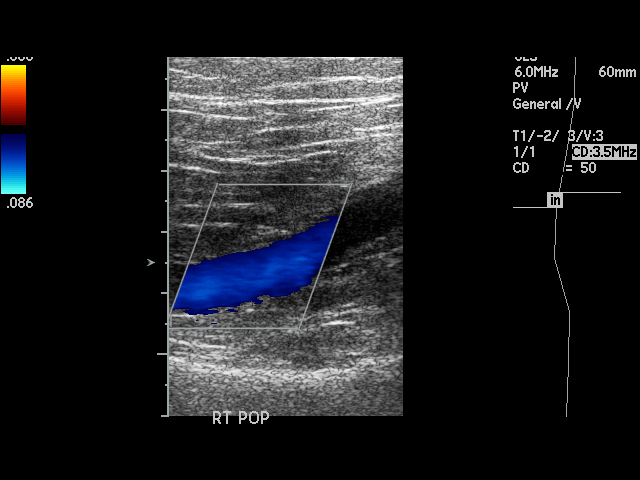
[im 30/37]
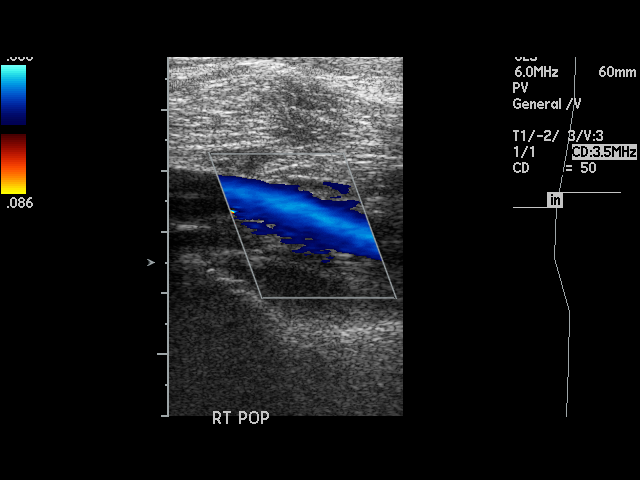
[im 32/37]
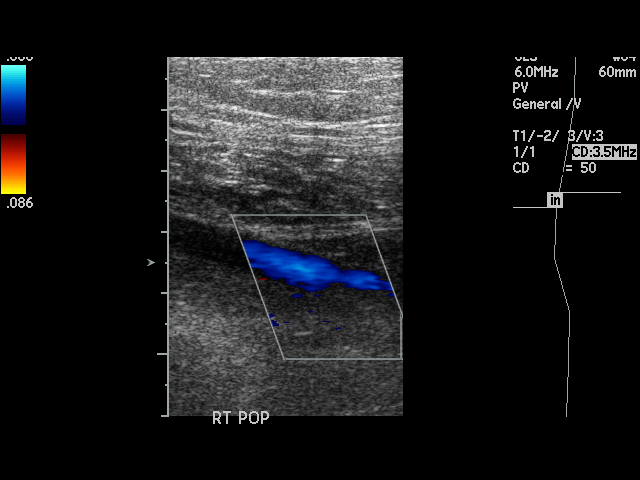
[im 33/37]
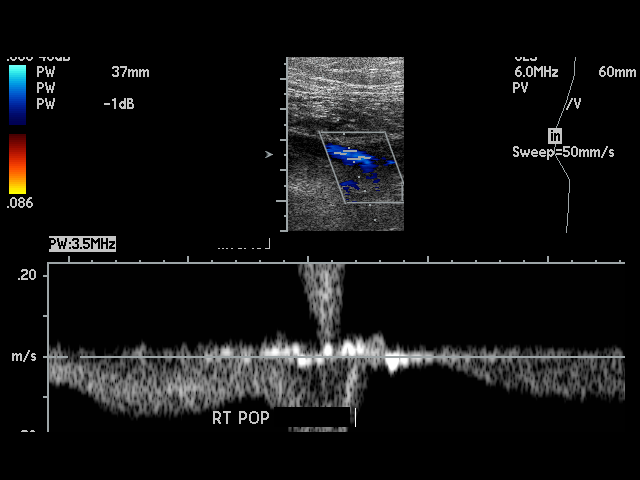
[im 37/37]
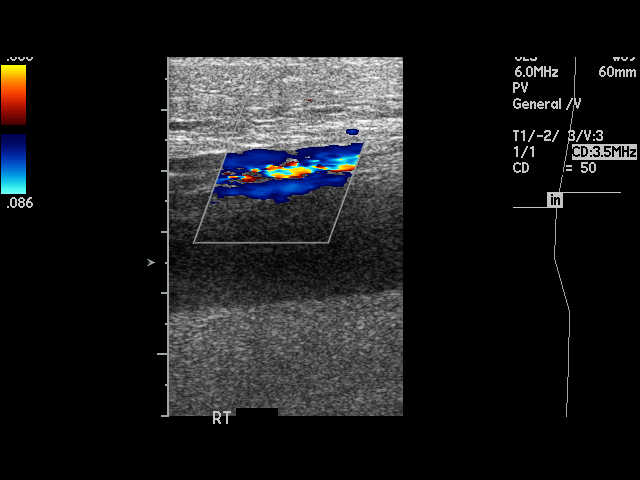

[17 of 24 positions shown; findings below may reference images not displayed]

PROCEDURE:     US  - US DOPPLER LOW EXTR RIGHT  - January 17, 2011 [DATE]

RESULT:     Comparison: None

Technique and findings: Multiple longitudinal and transverse grayscale as
well as color and spectral Doppler images of the right lower extremity veins
were obtained from the common femoral veins through the popliteal veins.

The right common femoral, femoral, and popliteal veins are patent,
demonstrating normal color-flow and compressibility. No intraluminal
thrombus is identified.  There is normal respiratory variation and
augmentation demonstrated at all vein levels.
IMPRESSION: No evidence of DVT in the right lower extremity.

## 2011-09-26 ENCOUNTER — Other Ambulatory Visit: Payer: Self-pay | Admitting: Family Medicine

## 2011-09-26 ENCOUNTER — Other Ambulatory Visit: Payer: Self-pay

## 2011-09-26 MED ORDER — OMEPRAZOLE 40 MG PO CPDR: 40.0000 mg | DELAYED_RELEASE_CAPSULE | Freq: Every day | ORAL | Status: DC

## 2011-09-26 NOTE — Telephone Encounter (Signed)
Pt said [facility] home delivery is no longer her pharmacy and request Omprazole 40 mg #90 x 3 to [facility]. Pt notified while on phone med sent to [facility].

## 2011-09-27 ENCOUNTER — Other Ambulatory Visit: Payer: Self-pay

## 2011-09-27 ENCOUNTER — Telehealth: Payer: Self-pay | Admitting: Pulmonary Disease

## 2011-09-27 MED ORDER — ROPINIROLE HCL 5 MG PO TABS: 5.0000 mg | ORAL_TABLET | Freq: Every day | ORAL | Status: DC

## 2011-09-27 NOTE — Telephone Encounter (Signed)
This is too long an interval between visits for me to refill script.  She either needs to have this refilled by her PCP or schedule ROV to reassess status of restless legs.

## 2011-09-27 NOTE — Telephone Encounter (Signed)
Pt left v/m that Dr Halford Chessman said pts PCP could refill Requip for restless leg. Pt request Requip refill sent to [facility]. Pt last seen 09/08/11.  Pt can be reached at [phone] or [phone].

## 2011-09-27 NOTE — Telephone Encounter (Signed)
I spoke with pt and she is requesting a refill on her requip 0.5 mg. Pt alst OV 01/25/10 and was told to f/u PRN--pt has no pending ROV Pt was given refill on requip 01/25/10 #30 x 3 refills. Pt is requesting to have VS refill this for her. Please advise VS thanks

## 2011-09-27 NOTE — Telephone Encounter (Signed)
I spoke with pt and she states she will see if her pcp will fill this. If not then she will call to make an apt. Will sign off message

## 2011-09-28 NOTE — Telephone Encounter (Signed)
Left v/m pts cell that med was sent to pharmacy as requested.

## 2011-10-03 ENCOUNTER — Other Ambulatory Visit: Payer: Self-pay | Admitting: Family Medicine

## 2011-10-06 ENCOUNTER — Other Ambulatory Visit: Payer: Self-pay | Admitting: *Deleted

## 2011-10-06 MED ORDER — CLONAZEPAM 0.5 MG PO TABS: 0.5000 mg | ORAL_TABLET | Freq: Two times a day (BID) | ORAL | Status: DC | PRN

## 2011-10-06 NOTE — Telephone Encounter (Signed)
[facility]

## 2011-10-13 ENCOUNTER — Telehealth: Payer: Self-pay | Admitting: Family Medicine

## 2011-10-13 DIAGNOSIS — M961 Postlaminectomy syndrome, not elsewhere classified: Secondary | ICD-10-CM | POA: Diagnosis not present

## 2011-10-13 NOTE — Telephone Encounter (Signed)
Yes I agree.

## 2011-10-13 NOTE — Telephone Encounter (Signed)
Patient called to ask if we can use our facility to meet therapist to adjust the hip stimulator so that she does not need to drive to [facility] to their office.  I advised that we typically do not use our facility for other providers to meet patients and that she would need to have that done at the facility where the provider is located who is treating her.  I confirmed that she was not requesting an office visit with [name] and that this request was to convenience the patient and said that it would be closer than for her to drive to [place].    Again, I explained that we don't use our facility for patients to meet other providers and this is probably something she will need to go to that facility to have done by their therapist.  Please advise if you agree.  Thanks

## 2011-10-17 DIAGNOSIS — F312 Bipolar disorder, current episode manic severe with psychotic features: Secondary | ICD-10-CM | POA: Diagnosis not present

## 2011-10-21 ENCOUNTER — Other Ambulatory Visit: Payer: Self-pay | Admitting: Family Medicine

## 2011-10-25 DIAGNOSIS — M5126 Other intervertebral disc displacement, lumbar region: Secondary | ICD-10-CM | POA: Diagnosis not present

## 2011-10-25 DIAGNOSIS — Z981 Arthrodesis status: Secondary | ICD-10-CM | POA: Diagnosis not present

## 2011-10-25 DIAGNOSIS — M47817 Spondylosis without myelopathy or radiculopathy, lumbosacral region: Secondary | ICD-10-CM | POA: Diagnosis not present

## 2011-10-25 DIAGNOSIS — M545 Low back pain: Secondary | ICD-10-CM | POA: Diagnosis not present

## 2011-10-28 IMAGING — CR DG CHEST 2V
2 series · 2 of 2 positions shown · non-contrast
Comparison: CT thorax 09/22/2010

CLINICAL DATA: Preop spinal cord stimulator placement

CHEST - 2 VIEW

[view not recorded (1 of 2)]
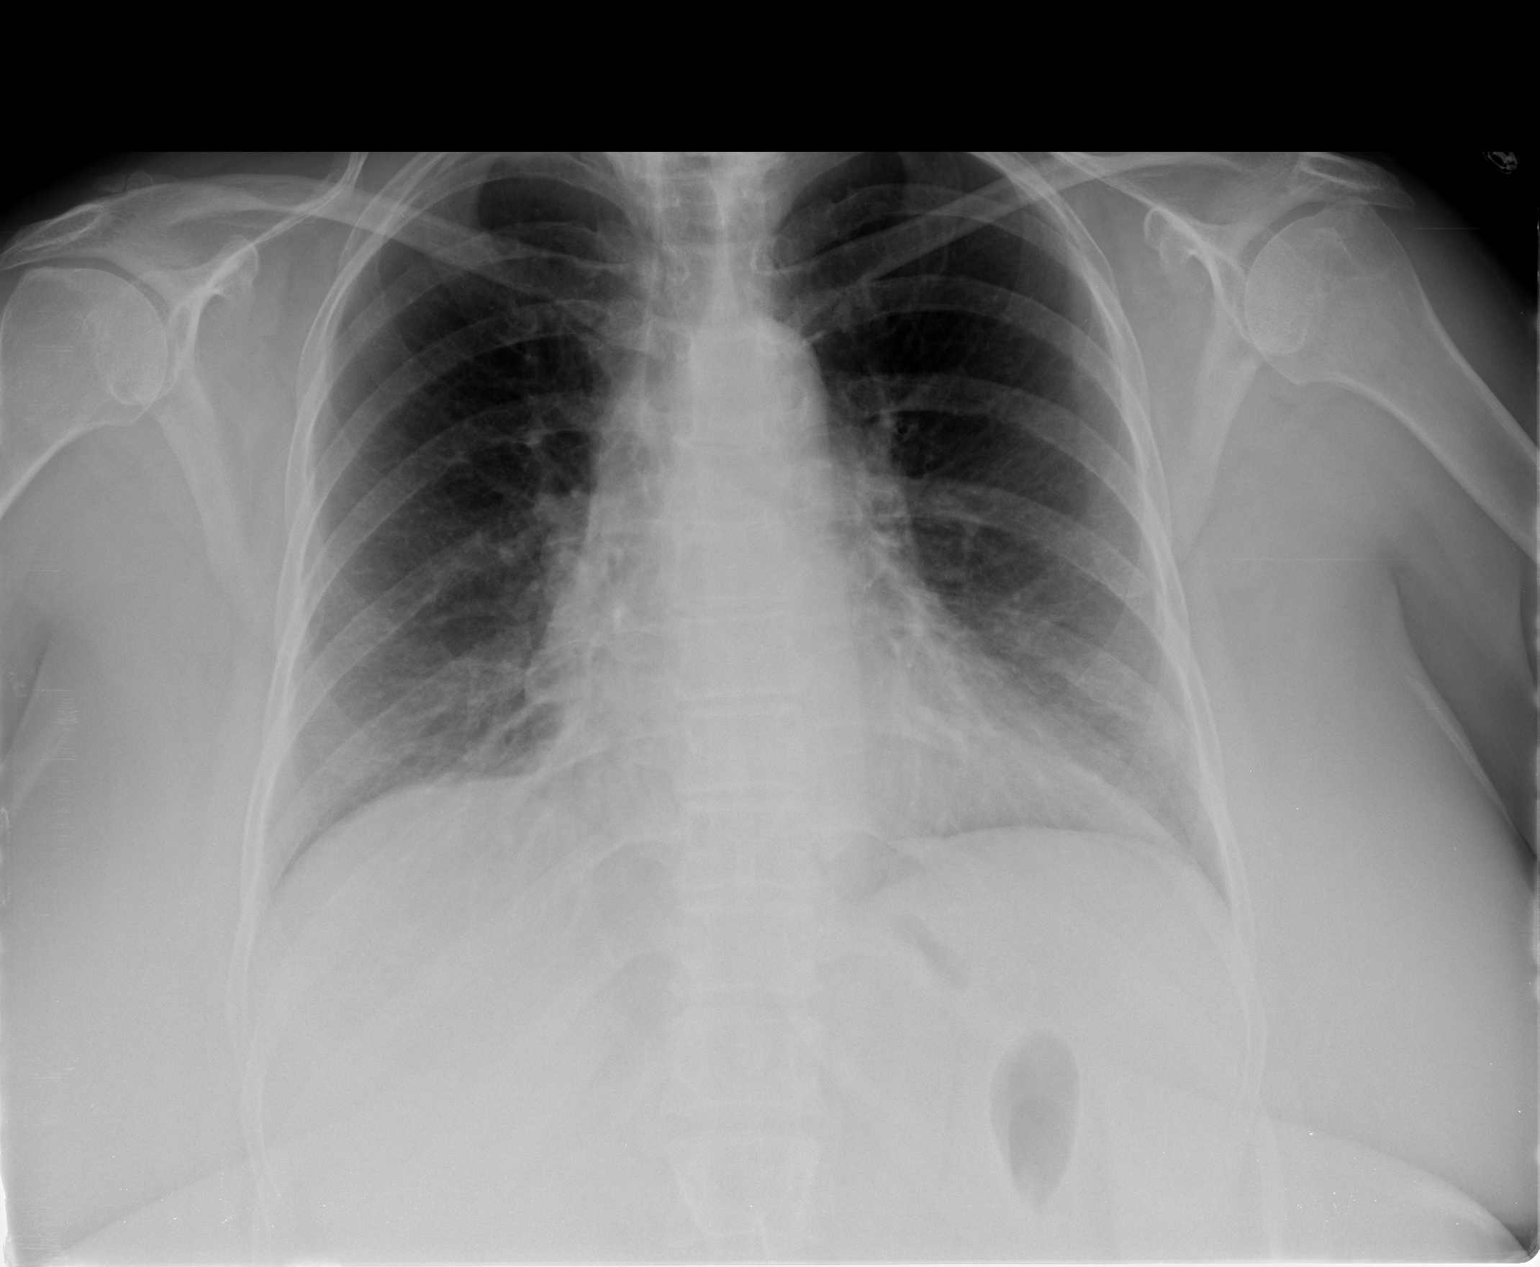

[view not recorded (2 of 2)]
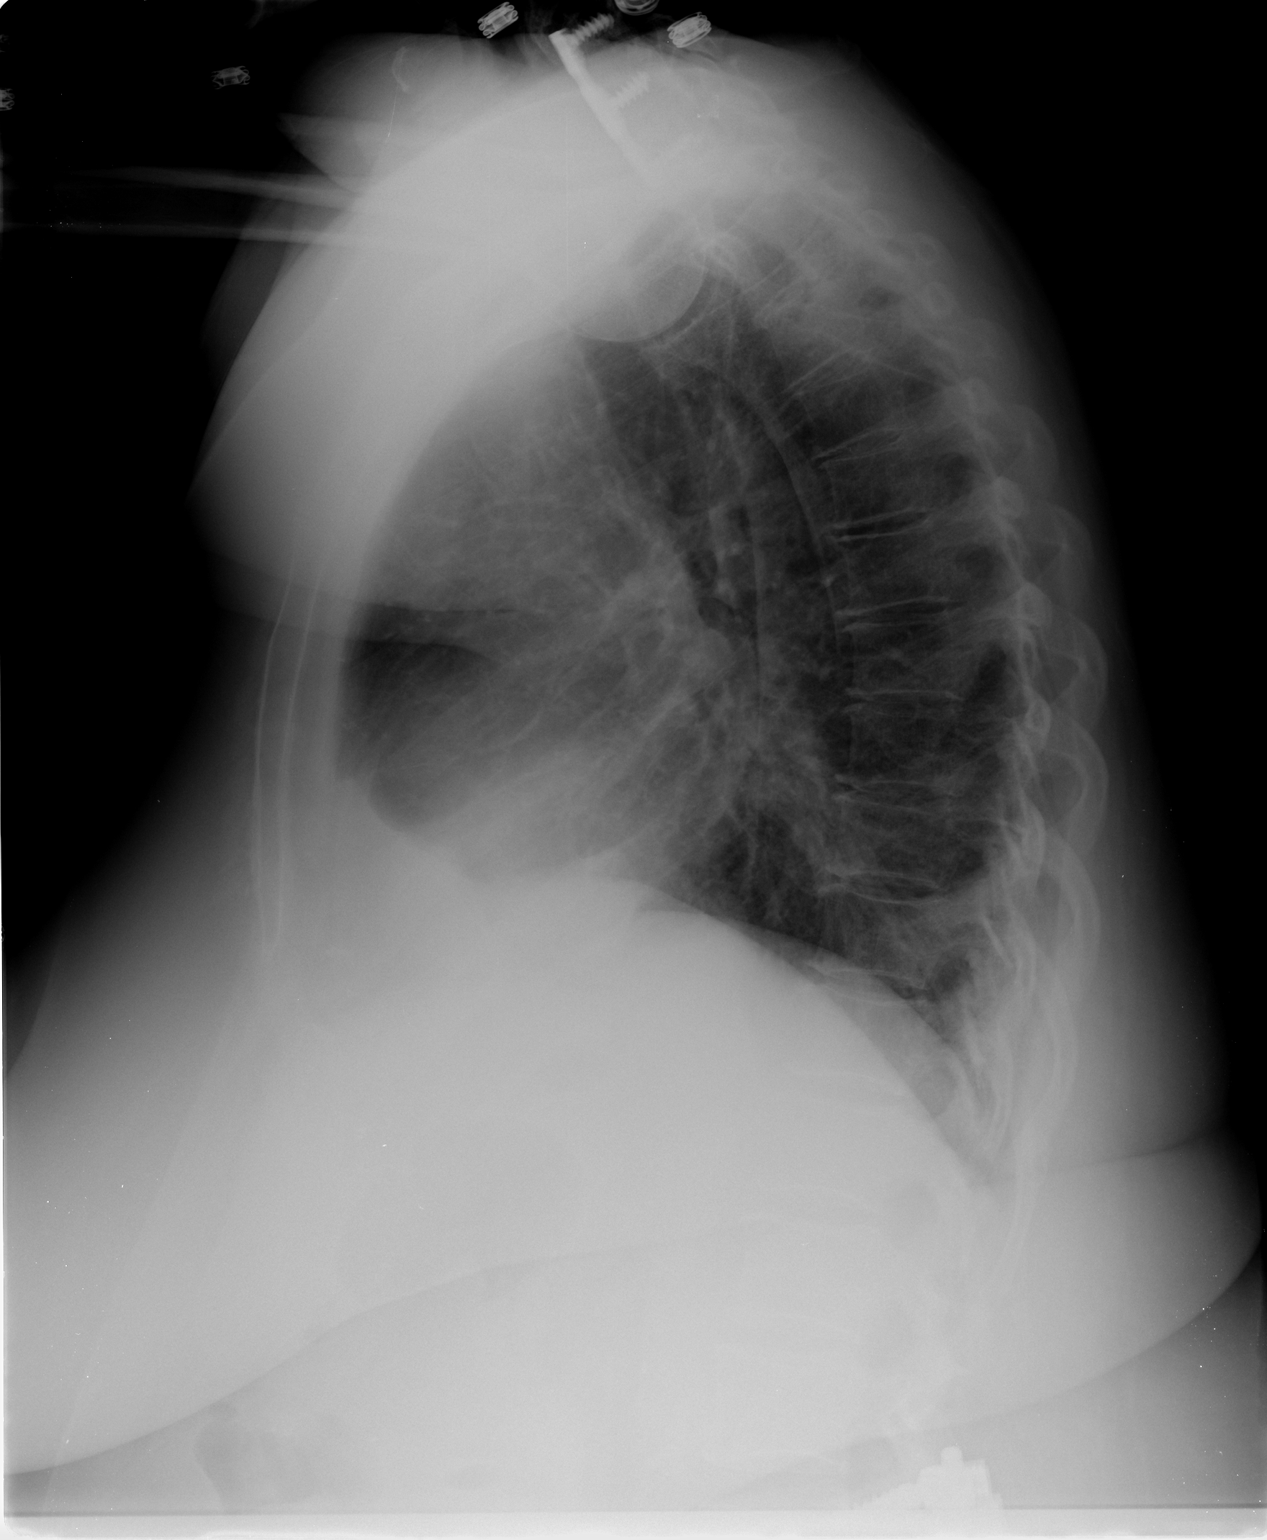

[2 of 2 positions shown; findings below may reference images not displayed]

FINDINGS: Anterior cervical fusion noted.  Mildly enlarged cardiac
silhouette.  No effusion, infiltrate, or pneumothorax.  There is a
posterior lumbar fusion incompletely imaged.
IMPRESSION: No acute cardiopulmonary process.

## 2011-11-07 DIAGNOSIS — M961 Postlaminectomy syndrome, not elsewhere classified: Secondary | ICD-10-CM | POA: Diagnosis not present

## 2011-11-09 DIAGNOSIS — M549 Dorsalgia, unspecified: Secondary | ICD-10-CM | POA: Diagnosis not present

## 2011-11-09 DIAGNOSIS — M961 Postlaminectomy syndrome, not elsewhere classified: Secondary | ICD-10-CM | POA: Diagnosis not present

## 2011-11-11 ENCOUNTER — Other Ambulatory Visit: Payer: Self-pay | Admitting: Family Medicine

## 2011-11-11 ENCOUNTER — Other Ambulatory Visit: Payer: Self-pay | Admitting: *Deleted

## 2011-11-11 MED ORDER — CLONAZEPAM 0.5 MG PO TABS: 0.5000 mg | ORAL_TABLET | Freq: Two times a day (BID) | ORAL | Status: DC | PRN

## 2011-11-11 NOTE — Telephone Encounter (Signed)
Faxed request from [facility], no last filled date given.

## 2011-11-11 NOTE — Telephone Encounter (Signed)
Medicine called to pharmacy.

## 2011-11-20 ENCOUNTER — Emergency Department: Payer: Self-pay | Admitting: Emergency Medicine

## 2011-11-20 DIAGNOSIS — M169 Osteoarthritis of hip, unspecified: Secondary | ICD-10-CM | POA: Diagnosis not present

## 2011-11-20 DIAGNOSIS — Z8619 Personal history of other infectious and parasitic diseases: Secondary | ICD-10-CM | POA: Diagnosis not present

## 2011-11-20 DIAGNOSIS — M25559 Pain in unspecified hip: Secondary | ICD-10-CM | POA: Diagnosis not present

## 2011-11-20 DIAGNOSIS — F17200 Nicotine dependence, unspecified, uncomplicated: Secondary | ICD-10-CM | POA: Diagnosis not present

## 2011-11-22 DIAGNOSIS — T859XXA Unspecified complication of internal prosthetic device, implant and graft, initial encounter: Secondary | ICD-10-CM | POA: Diagnosis not present

## 2011-11-25 ENCOUNTER — Other Ambulatory Visit: Payer: Self-pay | Admitting: Family Medicine

## 2011-12-01 ENCOUNTER — Ambulatory Visit (INDEPENDENT_AMBULATORY_CARE_PROVIDER_SITE_OTHER): Payer: Medicare Other | Admitting: Family Medicine

## 2011-12-01 ENCOUNTER — Encounter: Payer: Self-pay | Admitting: Family Medicine

## 2011-12-01 ENCOUNTER — Ambulatory Visit (INDEPENDENT_AMBULATORY_CARE_PROVIDER_SITE_OTHER)
Admission: RE | Admit: 2011-12-01 | Discharge: 2011-12-01 | Disposition: A | Discharge status: Home / Self Care | Payer: Medicare Other | Source: Ambulatory Visit | Attending: Family Medicine | Admitting: Family Medicine

## 2011-12-01 VITALS — BP 104/70 | HR 92 | Temp 97.5°F | Wt 190.0 lb

## 2011-12-01 DIAGNOSIS — R7309 Other abnormal glucose: Secondary | ICD-10-CM | POA: Diagnosis not present

## 2011-12-01 DIAGNOSIS — R05 Cough: Secondary | ICD-10-CM | POA: Diagnosis not present

## 2011-12-01 DIAGNOSIS — R739 Hyperglycemia, unspecified: Secondary | ICD-10-CM

## 2011-12-01 DIAGNOSIS — E559 Vitamin D deficiency, unspecified: Secondary | ICD-10-CM | POA: Diagnosis not present

## 2011-12-01 DIAGNOSIS — R5381 Other malaise: Secondary | ICD-10-CM | POA: Diagnosis not present

## 2011-12-01 DIAGNOSIS — R5383 Other fatigue: Secondary | ICD-10-CM

## 2011-12-01 LAB — CBC WITH DIFFERENTIAL/PLATELET
Basophils Relative: 1 % (ref 0.0–3.0)
Eosinophils Relative: 1.4 % (ref 0.0–5.0)
HCT: 40.5 % (ref 36.0–46.0)
Monocytes Relative: 7.8 % (ref 3.0–12.0)
Neutrophils Relative %: 63.5 % (ref 43.0–77.0)
Platelets: 162 10*3/uL (ref 150.0–400.0)
RBC: 4.35 Mil/uL (ref 3.87–5.11)
WBC: 6.2 10*3/uL (ref 4.5–10.5)

## 2011-12-01 LAB — COMPREHENSIVE METABOLIC PANEL
ALT: 16 U/L (ref 0–35)
AST: 15 U/L (ref 0–37)
Albumin: 3.9 g/dL (ref 3.5–5.2)
Alkaline Phosphatase: 118 U/L — ABNORMAL HIGH (ref 39–117)
BUN: 15 mg/dL (ref 6–23)
Chloride: 105 mEq/L (ref 96–112)
Potassium: 4.1 mEq/L (ref 3.5–5.1)

## 2011-12-01 LAB — TSH: TSH: 3.16 u[IU]/mL (ref 0.35–5.50)

## 2011-12-01 LAB — T4, FREE: Free T4: 0.57 ng/dL — ABNORMAL LOW (ref 0.60–1.60)

## 2011-12-01 NOTE — Patient Instructions (Addendum)
Good to see you. We will call you with your xray and lab results. Good luck with your surgery.

## 2011-12-01 NOTE — Progress Notes (Addendum)
Subjective:    Patient ID: Stephanie [name], female    DOB: [birth year], 61 y.o.   MRN: [id number]  HPI  61 yo with complicated medical history here for fatigue.  Past few weeks, increased fatigue.  Feels her chronic "smokers cough" is getting worse as well. No fevers.  Cough is non productive, usually in the morning.  She is having back surgery again in two days.   No blood in her stool. Denies any symptoms of hypo or hyperthyroidism.  She feels she could sleep all day and not feel rested.  Feels her anxiety is under pretty good control. On chronic narcotics and antipsychotics but no medication changes in last 3 weeks.  Appetite fine, gaining more weight than she would like. Wt Readings from Last 3 Encounters:  12/01/11 190 lb (86.183 kg)  07/11/11 183 lb 8 oz (83.235 kg)  06/22/11 191 lb (86.637 kg)    Sedentary, she has too much pain to exercise.   Patient Active Problem List  Diagnosis  . HEPATITIS B, CHRONIC  . Benign Neoplasm of Colon  . UNSPECIFIED VITAMIN D DEFICIENCY  . HYPERLIPIDEMIA  . TOBACCO ABUSE  . OBSTRUCTIVE SLEEP APNEA  . RESTLESS LEGS SYNDROME  . DISORDER, NERVOUS SYSTEM NOS  . RHEUMATIC FEVER  . HYPERTENSION  . RHINITIS  . COPD  . SMALL BOWEL OBSTRUCTION  . OTHER CONSTIPATION  . CELLULITIS AND ABSCESS OF OTHER SPECIFIED SITE  . FOLLICULITIS  . SKIN LESION  . HIP PAIN  . BACK PAIN, LUMBAR, CHRONIC  . SCIATICA, LEFT  . INSOMNIA  . EDEMA, LOCALIZED  . NAUSEA ALONE  . INCONTINENCE  . ABDOMINAL PAIN  . PELVIC PAIN, ACUTE  . MAMMOGRAM, ABNORMAL, RIGHT  . UNS ADVRS EFF OTH RX MEDICINAL&BIOLOGICAL SBSTNC  . BIPOLAR AFFECTIVE DISORDER, HX OF  . PERSONAL HISTORY OF COLONIC POLYPS  . DYSURIA  . INTERTRIGO, CANDIDAL  . RIB PAIN, RIGHT SIDED  . Routine general medical examination at a health care facility  . Hyperglycemia  . Localized swelling, mass, or lump of lower extremity  . Preop examination  . Aortic dissection  . Anxiety  . Fatigue     Past Medical History  Diagnosis Date  . Adenomatous colon polyp   . Insomnia   . RLS (restless legs syndrome)   . Colon polyp   . Incontinence   . Hepatitis B, chronic   . H/O: rheumatic fever   . Hyperlipidemia   . COPD (chronic obstructive pulmonary disease)   . Unspecified disorders of nervous system   . Bipolar affective disorder     h/o  . H/O: CVA (cardiovascular accident)     TIA  . Fatty liver 04/09/08    found in abd CT  . Shortness of breath   . Obstructive sleep apnea     not using CPAP, last sleep study 2.5 years ago, per pt doctor is aware  . Blood transfusion 2000  . GERD (gastroesophageal reflux disease)   . Arthritis   . Aortic dissection     Type 1   Past Surgical History  Procedure Date  . Back surgery   . Cholecystectomy   . Rotator cuff repair   . Abdominal hysterectomy     total  . Lumbar fusion 10/09  . Hemorroidectomy     and colon polyp removed  . Abdominal exploration surgery   . Posterior cervical fusion/foraminotomy   . Eye surgery     bilat  . Lumbar wound debridement 05/09/2011  Procedure: LUMBAR WOUND DEBRIDEMENT;  Surgeon: Dahlia Bailiff;  Location: [facility];  Service: Orthopedics;  Laterality: N/A;  IRRIGATION AND DEBRIDEMENT SPINAL WOUND  . Axillary artery cannulation via 8-mm hemashield graft, median sternotomy, extracorporeal circulation with deep hypothermic circulatory arrest, repair of  aortic dessection 01/23/2009    Dr Arlyce Dice   History  Substance Use Topics  . Smoking status: Current Everyday Smoker -- 0.5 packs/day  . Smokeless tobacco: Not on file  . Alcohol Use: No   Family History  Problem Relation Age of Onset  . Colon cancer Neg Hx   . Breast cancer Mother   . Lung cancer Mother   . Stomach cancer Father   . Diabetes      4 aunts, and 1 uncle   No Known Allergies Current Outpatient Prescriptions on File Prior to Visit  Medication Sig Dispense Refill  . ABILIFY 30 MG tablet TAKE 1 TABLET BY MOUTH EVERY  MORNING  30 tablet  0  . celecoxib (CELEBREX) 200 MG capsule Take 200 mg by mouth daily.      . clonazePAM (KLONOPIN) 0.5 MG tablet Take 1 tablet (0.5 mg total) by mouth 2 (two) times daily as needed for anxiety.  60 tablet  0  . cyclobenzaprine (FLEXERIL) 10 MG tablet TAKE ONE TABLET BY MOUTH TWICE DAILY AS NEEDED FOR BACK PAIN  90 tablet  0  . docusate sodium (COLACE) 100 MG capsule TAKE 1 CAPSULE BY MOUTH 2 TIMES DAILY AS NEEDED FOR CONSTIPATION  60 capsule  2  . estradiol (VIVELLE-DOT) 0.1 MG/24HR Place 1 patch (0.1 mg total) onto the skin 2 (two) times a week.  24 patch  12  . HYDROcodone-acetaminophen (NORCO) 10-325 MG per tablet Take 1 tablet by mouth. Every 6-8 hours as needed for pain       . lamoTRIgine (LAMICTAL) 100 MG tablet Take 3 tablets by mouth once daily      . metFORMIN (GLUCOPHAGE-XR) 500 MG 24 hr tablet Take 1 tablet (500 mg total) by mouth daily with breakfast.  30 tablet  6  . mirtazapine (REMERON) 15 MG tablet Take 15 mg by mouth at bedtime.      Marland Kitchen nystatin (MYCOSTATIN) powder Apply topically 2 (two) times daily.  15 g  0  . omeprazole (PRILOSEC) 40 MG capsule Take 1 capsule (40 mg total) by mouth daily.  90 capsule  3  . promethazine (PHENERGAN) 25 MG tablet TAKE ONE-HALF OR ONE TABLET BY MOUTH EVERY 8 HOURS AS NEEDED FOR NAUSEA  30 tablet  1  . QUEtiapine (SEROQUEL) 300 MG tablet Take 300 mg by mouth 3 (three) times daily.        . ropinirole (REQUIP) 5 MG tablet Take 1 tablet (5 mg total) by mouth at bedtime.  30 tablet  3  . zolpidem (AMBIEN) 10 MG tablet Take 10 mg by mouth at bedtime as needed. For sleep      . DISCONTD: clonazePAM (KLONOPIN) 0.5 MG tablet Take 1 tablet (0.5 mg total) by mouth 2 (two) times daily as needed for anxiety.  60 tablet  0   The PMH, PSH, Social History, Family History, Medications, and allergies have been reviewed in [facility], and have been updated if relevant.   Review of Systems See HPI  No nausea, vomiting or diarrhea. No CP or SOB      Objective:   Physical Exam BP 104/70  Pulse 92  Temp 97.5 F (36.4 C)  Wt 190 lb (86.183 kg) Gen:  Alert, pleasant, NAD HEENT: neck supple, no nodules, non tender Resp:  Faint scattered wheezes otherwise unremarkable CVS:  RRR Ext:  No edema    Assessment & Plan:   1. Hyperglycemia  H/o hyperglyemia in past. Recheck a1c today. Hemoglobin A1c  2. Fatigue  Likely multifactorial. Given worsening cough with surgery this week, will check CXR and lab work today. Comprehensive metabolic panel, CBC with Differential, TSH, T4, Free  3. Vitamin D deficiency disease  Vitamin D 25 hydroxy  4. Cough  DG Chest 2 View

## 2011-12-02 LAB — VITAMIN D 25 HYDROXY (VIT D DEFICIENCY, FRACTURES): Vit D, 25-Hydroxy: 25 ng/mL — ABNORMAL LOW (ref 30–89)

## 2011-12-05 DIAGNOSIS — T85890A Other specified complication of nervous system prosthetic devices, implants and grafts, initial encounter: Secondary | ICD-10-CM | POA: Diagnosis not present

## 2011-12-05 DIAGNOSIS — Y831 Surgical operation with implant of artificial internal device as the cause of abnormal reaction of the patient, or of later complication, without mention of misadventure at the time of the procedure: Secondary | ICD-10-CM | POA: Diagnosis not present

## 2011-12-05 DIAGNOSIS — T85695A Other mechanical complication of other nervous system device, implant or graft, initial encounter: Secondary | ICD-10-CM | POA: Diagnosis not present

## 2011-12-06 ENCOUNTER — Telehealth: Payer: Self-pay | Admitting: *Deleted

## 2011-12-06 NOTE — Telephone Encounter (Signed)
Labs faxed to [facility], fax number [phone].

## 2011-12-16 ENCOUNTER — Telehealth: Payer: Self-pay | Admitting: Radiology

## 2011-12-16 ENCOUNTER — Other Ambulatory Visit (INDEPENDENT_AMBULATORY_CARE_PROVIDER_SITE_OTHER): Payer: Medicare Other

## 2011-12-16 DIAGNOSIS — R5381 Other malaise: Secondary | ICD-10-CM

## 2011-12-16 DIAGNOSIS — E559 Vitamin D deficiency, unspecified: Secondary | ICD-10-CM

## 2011-12-16 DIAGNOSIS — R5383 Other fatigue: Secondary | ICD-10-CM | POA: Diagnosis not present

## 2011-12-16 LAB — TSH: TSH: 2.71 u[IU]/mL (ref 0.35–5.50)

## 2011-12-16 NOTE — Telephone Encounter (Signed)
Patient wants to know if she should be taking Vit D ?

## 2011-12-16 NOTE — Telephone Encounter (Signed)
We could check her vitamin D levels first to know if she does need a supplement.  All women above the age of 12 should attempt to get 1200 mg of calcium daily and 400-800IU of vitamin D.  You can take a caltrate supplement (which has both in it), but you can get calcium and vitamin D naturally. Please send her information below:  To figure out dietary calcium: 300 mg/day from all non dairy foods plus 300 mg per cup of milk, other dairy, or fortified juice.  Non dairy foods that contain calcium:  Kale, oranges, sardines, oatmeal, soy milk/soybeans, salmon, white beans, dried figs, turnip greens, almonds, broccoli, tofu.  Vitamin D: Fish oil- tuna, salmon, makerel Small amount in cheese, egg yolks Most people get most of their vitamind D from sun exposure- 5- 30 minutes of sun exposure twice a week.

## 2011-12-16 NOTE — Telephone Encounter (Signed)
Left message on voice mail asking patient to call back.

## 2011-12-19 NOTE — Telephone Encounter (Signed)
Patient notified as instructed by telephone. Pt scheduled Vit D lab 12/21/11 at 8:45. Pt will pick up copy of info below at front desk.

## 2011-12-21 ENCOUNTER — Other Ambulatory Visit (INDEPENDENT_AMBULATORY_CARE_PROVIDER_SITE_OTHER): Payer: Medicare Other

## 2011-12-21 DIAGNOSIS — E559 Vitamin D deficiency, unspecified: Secondary | ICD-10-CM | POA: Diagnosis not present

## 2011-12-22 ENCOUNTER — Other Ambulatory Visit: Payer: Self-pay | Admitting: *Deleted

## 2011-12-22 MED ORDER — ERGOCALCIFEROL 1.25 MG (50000 UT) PO CAPS: 50000.0000 [IU] | ORAL_CAPSULE | ORAL | Status: DC

## 2011-12-27 ENCOUNTER — Other Ambulatory Visit: Payer: Self-pay | Admitting: Family Medicine

## 2011-12-30 ENCOUNTER — Other Ambulatory Visit: Payer: Medicare Other

## 2012-01-02 ENCOUNTER — Other Ambulatory Visit: Payer: Self-pay | Admitting: Family Medicine

## 2012-01-02 NOTE — Telephone Encounter (Signed)
Do you want her to take one a day?

## 2012-01-02 NOTE — Telephone Encounter (Signed)
Yes ok to change to 3 mg tablets.

## 2012-01-02 NOTE — Telephone Encounter (Signed)
Yes please

## 2012-01-02 NOTE — Telephone Encounter (Signed)
Call from pharmacist at [facility]- pt's requip 5 mg's isnt available.  They do have 2 mg's and 3 mg's.  Do you want to change her to one of these?

## 2012-01-02 NOTE — Telephone Encounter (Signed)
Medicine changed at pharmacy, added 3 mg requip to pt's meds list.

## 2012-01-03 ENCOUNTER — Other Ambulatory Visit: Payer: Self-pay | Admitting: Family Medicine

## 2012-01-03 DIAGNOSIS — T85695A Other mechanical complication of other nervous system device, implant or graft, initial encounter: Secondary | ICD-10-CM | POA: Diagnosis not present

## 2012-01-03 MED ORDER — DIAZEPAM 5 MG PO TABS: ORAL_TABLET | ORAL | Status: DC

## 2012-01-03 NOTE — Telephone Encounter (Signed)
Pt was seen by orthopedic today and was given scripts for percocet.  She told him that percocet doesn't help her so he told her to get scripts for celebrex and valium from you.  She doesn't need a refill on celebrex right now but does need the valium.  She takes 5 mg's twice a day as needed for muscle spasms.  This was prescribed by Dr. Rolena Infante, her ortho surgeon.

## 2012-01-03 NOTE — Telephone Encounter (Signed)
Pt is needing Valum and Celebrex. [facility].

## 2012-01-03 NOTE — Telephone Encounter (Signed)
Please enter as refill request.

## 2012-01-03 NOTE — Telephone Encounter (Signed)
[facility]

## 2012-01-03 NOTE — Telephone Encounter (Signed)
Ok to refill one month of valium as below.  No refills.

## 2012-01-09 ENCOUNTER — Encounter: Payer: Self-pay | Admitting: Family Medicine

## 2012-01-09 ENCOUNTER — Encounter: Payer: Self-pay | Admitting: *Deleted

## 2012-01-09 ENCOUNTER — Ambulatory Visit: Payer: Self-pay | Admitting: Family Medicine

## 2012-01-09 DIAGNOSIS — Z803 Family history of malignant neoplasm of breast: Secondary | ICD-10-CM | POA: Diagnosis not present

## 2012-01-09 DIAGNOSIS — Z1231 Encounter for screening mammogram for malignant neoplasm of breast: Secondary | ICD-10-CM | POA: Diagnosis not present

## 2012-01-09 LAB — HM MAMMOGRAPHY: HM Mammogram: NORMAL

## 2012-01-16 ENCOUNTER — Other Ambulatory Visit: Payer: Self-pay | Admitting: *Deleted

## 2012-01-16 DIAGNOSIS — F312 Bipolar disorder, current episode manic severe with psychotic features: Secondary | ICD-10-CM | POA: Diagnosis not present

## 2012-01-16 MED ORDER — CLONAZEPAM 0.5 MG PO TABS: 0.5000 mg | ORAL_TABLET | Freq: Two times a day (BID) | ORAL | Status: DC | PRN

## 2012-01-16 NOTE — Telephone Encounter (Signed)
Last filled 11-10-2001

## 2012-01-16 NOTE — Telephone Encounter (Signed)
Medicine called to pharmacy.

## 2012-01-17 ENCOUNTER — Telehealth: Payer: Self-pay

## 2012-01-17 NOTE — Telephone Encounter (Signed)
Pt wants to clarify which meds pt should take; Dr Rolena Infante surgeon ordered Percocet, ondansetron 4 mg for N& V,and he stopped flexeril and started Valium for muscle spasms in back. Pt is taking promethazine for N&V also. Pt wants Dr Hulen Shouts opinion of what med she should be taking. [facility]. Pt does not need refills now. 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

## 2012-01-17 NOTE — Telephone Encounter (Signed)
Ok to take what he recommended.  Valium is a good muscle relaxant so if she is taking this, she does not need the flexeril.  She can take zofran or phenergan for nausea.  I agree with what she is taking.

## 2012-01-17 NOTE — Telephone Encounter (Signed)
Advised patient

## 2012-01-17 NOTE — Telephone Encounter (Signed)
Left message asking pt to call back.

## 2012-01-20 ENCOUNTER — Other Ambulatory Visit: Payer: Self-pay | Admitting: Family Medicine

## 2012-01-24 ENCOUNTER — Telehealth: Payer: Self-pay

## 2012-01-24 ENCOUNTER — Ambulatory Visit (INDEPENDENT_AMBULATORY_CARE_PROVIDER_SITE_OTHER): Payer: Medicare Other | Admitting: Family Medicine

## 2012-01-24 ENCOUNTER — Encounter: Payer: Self-pay | Admitting: Family Medicine

## 2012-01-24 VITALS — BP 120/80 | HR 56 | Temp 97.4°F | Wt 201.0 lb

## 2012-01-24 DIAGNOSIS — R609 Edema, unspecified: Secondary | ICD-10-CM

## 2012-01-24 DIAGNOSIS — R109 Unspecified abdominal pain: Secondary | ICD-10-CM | POA: Diagnosis not present

## 2012-01-24 LAB — CBC WITH DIFFERENTIAL/PLATELET
Basophils Absolute: 0.1 10*3/uL (ref 0.0–0.1)
Eosinophils Absolute: 0.1 10*3/uL (ref 0.0–0.7)
HCT: 37.4 % (ref 36.0–46.0)
Hemoglobin: 12.3 g/dL (ref 12.0–15.0)
Lymphocytes Relative: 24.8 % (ref 12.0–46.0)
Lymphs Abs: 1.3 10*3/uL (ref 0.7–4.0)
MCHC: 32.9 g/dL (ref 30.0–36.0)
Neutro Abs: 3.4 10*3/uL (ref 1.4–7.7)
Platelets: 162 10*3/uL (ref 150.0–400.0)
RDW: 14.2 % (ref 11.5–14.6)

## 2012-01-24 LAB — COMPREHENSIVE METABOLIC PANEL
ALT: 30 U/L (ref 0–35)
AST: 30 U/L (ref 0–37)
Alkaline Phosphatase: 139 U/L — ABNORMAL HIGH (ref 39–117)
BUN: 14 mg/dL (ref 6–23)
Creatinine, Ser: 0.8 mg/dL (ref 0.4–1.2)

## 2012-01-24 LAB — LIPASE: Lipase: 26 U/L (ref 11.0–59.0)

## 2012-01-24 NOTE — Telephone Encounter (Signed)
Pt has gained 15 lbs in last 4 weeks. Pt watching diet closely;pt started taking Valium for muscle spasms but request appt to find out why gaining weight. Pt to see Dr Deborra Medina today at 11:45 am.

## 2012-01-24 NOTE — Patient Instructions (Addendum)
Good to see you. We are checking labs and a CT scan. Please go to the ER if your symptoms worsen overnight.

## 2012-01-24 NOTE — Progress Notes (Signed)
Subjective:    Patient ID: Stephanie [name], female    DOB: 1950-06-29, 61 y.o.   MRN: [id number]  HPI  61 yo with complicated medical history here "retaining fluid," weight gain and abdominal pain.  Past few weeks, increased weight gain. Wt Readings from Last 3 Encounters:  01/24/12 201 lb (91.173 kg)  12/01/11 190 lb (86.183 kg)  07/11/11 183 lb 8 oz (83.235 kg)   Has not been eating more, actually very nauseated. Recent back surgery.  No vomiting. No diarrhea or changes in her bowel habits.   Sedentary, she has too much pain to exercise.   Patient Active Problem List  Diagnosis  . HEPATITIS B, CHRONIC  . Benign Neoplasm of Colon  . UNSPECIFIED VITAMIN D DEFICIENCY  . HYPERLIPIDEMIA  . TOBACCO ABUSE  . OBSTRUCTIVE SLEEP APNEA  . RESTLESS LEGS SYNDROME  . DISORDER, NERVOUS SYSTEM NOS  . RHEUMATIC FEVER  . HYPERTENSION  . RHINITIS  . COPD  . SMALL BOWEL OBSTRUCTION  . OTHER CONSTIPATION  . CELLULITIS AND ABSCESS OF OTHER SPECIFIED SITE  . FOLLICULITIS  . SKIN LESION  . HIP PAIN  . BACK PAIN, LUMBAR, CHRONIC  . SCIATICA, LEFT  . INSOMNIA  . EDEMA, LOCALIZED  . NAUSEA ALONE  . INCONTINENCE  . ABDOMINAL PAIN  . PELVIC PAIN, ACUTE  . MAMMOGRAM, ABNORMAL, RIGHT  . UNS ADVRS EFF OTH RX MEDICINAL&BIOLOGICAL SBSTNC  . BIPOLAR AFFECTIVE DISORDER, HX OF  . PERSONAL HISTORY OF COLONIC POLYPS  . DYSURIA  . INTERTRIGO, CANDIDAL  . RIB PAIN, RIGHT SIDED  . Routine general medical examination at a health care facility  . Hyperglycemia  . Localized swelling, mass, or lump of lower extremity  . Preop examination  . Aortic dissection  . Anxiety  . Fatigue   Past Medical History  Diagnosis Date  . Adenomatous colon polyp   . Insomnia   . RLS (restless legs syndrome)   . Colon polyp   . Incontinence   . Hepatitis B, chronic   . H/O: rheumatic fever   . Hyperlipidemia   . COPD (chronic obstructive pulmonary disease)   . Unspecified disorders of nervous system    . Bipolar affective disorder     h/o  . H/O: CVA (cardiovascular accident)     TIA  . Fatty liver 04/09/08    found in abd CT  . Shortness of breath   . Obstructive sleep apnea     not using CPAP, last sleep study 2.5 years ago, per pt doctor is aware  . Blood transfusion 2000  . GERD (gastroesophageal reflux disease)   . Arthritis   . Aortic dissection     Type 1   Past Surgical History  Procedure Date  . Back surgery   . Cholecystectomy   . Rotator cuff repair   . Abdominal hysterectomy     total  . Lumbar fusion 10/09  . Hemorroidectomy     and colon polyp removed  . Abdominal exploration surgery   . Posterior cervical fusion/foraminotomy   . Eye surgery     bilat  . Lumbar wound debridement 05/09/2011    Procedure: LUMBAR WOUND DEBRIDEMENT;  Surgeon: Dahlia Bailiff;  Location: [facility];  Service: Orthopedics;  Laterality: N/A;  IRRIGATION AND DEBRIDEMENT SPINAL WOUND  . Axillary artery cannulation via 8-mm hemashield graft, median sternotomy, extracorporeal circulation with deep hypothermic circulatory arrest, repair of  aortic dessection 01/23/2009    Dr Arlyce Dice   History  Substance Use  Topics  . Smoking status: Current Everyday Smoker -- 0.5 packs/day  . Smokeless tobacco: Not on file  . Alcohol Use: No   Family History  Problem Relation Age of Onset  . Colon cancer Neg Hx   . Breast cancer Mother   . Lung cancer Mother   . Stomach cancer Father   . Diabetes      4 aunts, and 1 uncle   No Known Allergies Current Outpatient Prescriptions on File Prior to Visit  Medication Sig Dispense Refill  . ABILIFY 30 MG tablet TAKE 1 TABLET BY MOUTH EVERY MORNING  30 tablet  0  . celecoxib (CELEBREX) 200 MG capsule Take 200 mg by mouth daily.      . clonazePAM (KLONOPIN) 0.5 MG tablet Take 1 tablet (0.5 mg total) by mouth 2 (two) times daily as needed for anxiety.  60 tablet  0  . diazepam (VALIUM) 5 MG tablet Take one by mouth twice a day as needed for muscle spasms.  60  tablet  0  . docusate sodium (COLACE) 100 MG capsule TAKE 1 CAPSULE BY MOUTH 2 TIMES DAILY AS NEEDED FOR CONSTIPATION  60 capsule  3  . ergocalciferol (VITAMIN D2) 50000 UNITS capsule Take 1 capsule (50,000 Units total) by mouth once a week.  6 capsule  0  . estradiol (VIVELLE-DOT) 0.1 MG/24HR Place 1 patch (0.1 mg total) onto the skin 2 (two) times a week.  24 patch  12  . HYDROcodone-acetaminophen (NORCO) 10-325 MG per tablet Take 1 tablet by mouth. Every 6-8 hours as needed for pain       . lamoTRIgine (LAMICTAL) 100 MG tablet Take 3 tablets by mouth once daily      . metFORMIN (GLUCOPHAGE-XR) 500 MG 24 hr tablet TAKE 1 TABLET (500 MG TOTAL) BY MOUTH DAILY WITH BREAKFAST.  30 tablet  6  . mirtazapine (REMERON) 15 MG tablet Take 15 mg by mouth at bedtime.      Marland Kitchen nystatin (MYCOSTATIN/NYSTOP) 100000 UNIT/GM POWD APPLY TOPICALLY 2 TIMES DAILY.AS DIRECTED  15 g  0  . omeprazole (PRILOSEC) 40 MG capsule Take 1 capsule (40 mg total) by mouth daily.  90 capsule  3  . promethazine (PHENERGAN) 25 MG tablet TAKE ONE-HALF OR ONE TABLET BY MOUTH EVERY 8 HOURS AS NEEDED FOR NAUSEA  30 tablet  1  . QUEtiapine (SEROQUEL) 300 MG tablet Take 300 mg by mouth 3 (three) times daily.        Marland Kitchen rOPINIRole (REQUIP) 3 MG tablet Take 3 mg by mouth at bedtime.      . ropinirole (REQUIP) 5 MG tablet Take 1 tablet (5 mg total) by mouth at bedtime.  30 tablet  3  . zolpidem (AMBIEN) 10 MG tablet Take 10 mg by mouth at bedtime as needed. For sleep       The PMH, PSH, Social History, Family History, Medications, and allergies have been reviewed in [facility], and have been updated if relevant.   Review of Systems See HPI  No nausea, vomiting or diarrhea. No CP or SOB    Objective:   Physical Exam BP 120/80  Pulse 56  Temp 97.4 F (36.3 C)  Wt 201 lb (91.173 kg)  Wt Readings from Last 3 Encounters:  01/24/12 201 lb (91.173 kg)  12/01/11 190 lb (86.183 kg)  07/11/11 183 lb 8 oz (83.235 kg)    Gen:  Alert, pleasant,  NAD HEENT: neck supple, no nodules, non tender Abd:  Soft but diffuse tender,  specifically over epigastric area, some guarding, no rebound,+fluid wave. CVS:  RRR Ext:  No edema    Assessment & Plan:   1. ABDOMINAL PAIN  With unknown etiology- given exam findings, will order stat CT of abdomen and pevlis, CBC, lipase and CMET. ?pancreatitis, cannot rule out hepatic issue. CT Abdomen Pelvis W Contrast, CBC with Differential, Lipase, Comprehensive metabolic panel  2. EDEMA, LOCALIZED  See above.

## 2012-01-25 ENCOUNTER — Ambulatory Visit (INDEPENDENT_AMBULATORY_CARE_PROVIDER_SITE_OTHER)
Admission: RE | Admit: 2012-01-25 | Discharge: 2012-01-25 | Disposition: A | Discharge status: Home / Self Care | Payer: Medicare Other | Source: Ambulatory Visit | Attending: Family Medicine | Admitting: Family Medicine

## 2012-01-25 ENCOUNTER — Telehealth: Payer: Self-pay

## 2012-01-25 DIAGNOSIS — R109 Unspecified abdominal pain: Secondary | ICD-10-CM

## 2012-01-25 DIAGNOSIS — K7460 Unspecified cirrhosis of liver: Secondary | ICD-10-CM | POA: Diagnosis not present

## 2012-01-25 MED ORDER — IOHEXOL 300 MG/ML  SOLN
100.0000 mL | Freq: Once | INTRAMUSCULAR | Status: AC | PRN
  Administered 2012-01-25: 100 mL via INTRAVENOUS

## 2012-01-25 NOTE — Telephone Encounter (Signed)
Pt requested lab results again. Patient notified as instructed by telephone. Call [phone] when CT results are in.

## 2012-01-26 ENCOUNTER — Other Ambulatory Visit: Payer: Self-pay | Admitting: Family Medicine

## 2012-01-26 DIAGNOSIS — K7460 Unspecified cirrhosis of liver: Secondary | ICD-10-CM

## 2012-01-27 ENCOUNTER — Telehealth: Payer: Self-pay

## 2012-01-27 NOTE — Telephone Encounter (Signed)
Pt left v/m requesting Vit D results and does pt have to take Vit D medication if so instructions of how to take med.

## 2012-01-27 NOTE — Telephone Encounter (Signed)
Advised pt to take 1600 units a day, since she has finished the the high dose prescription vitamin D.  Come back for lab appt to recheck on 10/2.

## 2012-01-27 NOTE — Telephone Encounter (Signed)
Pt was advised of results last month.  Left message on voice mail asking her to call back.

## 2012-01-30 ENCOUNTER — Telehealth: Payer: Self-pay | Admitting: *Deleted

## 2012-01-30 MED ORDER — VITAMIN D 400 UNITS PO TABS: ORAL_TABLET | ORAL | Status: DC

## 2012-01-30 NOTE — Telephone Encounter (Signed)
Pt was told to take 1600 units of vitamin D daily, but pt says that is too expensive.  She can't find that dose in one bottle and it will cost her $25.00 to get enough to make that dose.  She is asking if you can give her a script for a lower dose.  [facility].

## 2012-01-30 NOTE — Addendum Note (Signed)
Addended by: Ricki Miller on: 01/30/2012 05:05 PM   Modules accepted: Orders

## 2012-01-30 NOTE — Telephone Encounter (Signed)
Advised pt.  She does want prescription, script sent to 400 units, to take 2 a day.

## 2012-01-30 NOTE — Telephone Encounter (Signed)
Quantity changed to 60 on script, had sent in # 6 in error.  Correct [facility].

## 2012-01-30 NOTE — Telephone Encounter (Signed)
Its ok to take 800 IU per day- this is OTC or you can send in an rx for her- 1 month with 6 refills.

## 2012-01-31 ENCOUNTER — Ambulatory Visit: Payer: Medicare Other | Admitting: Pulmonary Disease

## 2012-02-02 ENCOUNTER — Ambulatory Visit (INDEPENDENT_AMBULATORY_CARE_PROVIDER_SITE_OTHER): Payer: Medicare Other | Admitting: Family Medicine

## 2012-02-02 ENCOUNTER — Encounter: Payer: Self-pay | Admitting: Family Medicine

## 2012-02-02 VITALS — BP 130/82 | HR 100 | Temp 97.5°F | Wt 207.0 lb

## 2012-02-02 DIAGNOSIS — K7460 Unspecified cirrhosis of liver: Secondary | ICD-10-CM | POA: Diagnosis not present

## 2012-02-02 DIAGNOSIS — R609 Edema, unspecified: Secondary | ICD-10-CM | POA: Diagnosis not present

## 2012-02-02 DIAGNOSIS — R600 Localized edema: Secondary | ICD-10-CM

## 2012-02-02 MED ORDER — FUROSEMIDE 20 MG PO TABS: 20.0000 mg | ORAL_TABLET | Freq: Every day | ORAL | Status: DC

## 2012-02-02 MED ORDER — POTASSIUM CHLORIDE ER 10 MEQ PO TBCR: EXTENDED_RELEASE_TABLET | ORAL | Status: DC

## 2012-02-02 NOTE — Patient Instructions (Addendum)
We are adding lasix - take 1 tablet daily for 1 week and then please come in for lab work (BMET). I think this swelling is probably due in part to your cirrhosis.  Keep your appt with Dr. Deatra Ina tomorrow.  Take potassium every day while taking lasix only.

## 2012-02-02 NOTE — Progress Notes (Signed)
Subjective:    Patient ID: Stephanie [name], female    DOB: 04-06-1951, 61 y.o.   MRN: [id number]  HPI  61 yo with complicated medical history here "retaining fluid," weight gain and abdominal pain.  Past few weeks, increased weight gain. Wt Readings from Last 3 Encounters:  02/02/12 207 lb (93.895 kg)  01/24/12 201 lb (91.173 kg)  12/01/11 190 lb (86.183 kg)   Has remote h/o Hepatitis B ( see previous notes from previous providers).  CT scan from 01/24/12 consistent with cirrhosis.  Continues to complain of nausea, no vomiting. No diarrhea or changes in her bowel habits.   Sedentary, she has too much pain to exercise.  Has swelling now in her face, on and off in her legs bilaterally, right >left and abdomen.  Lab Results  Component Value Date   ALT 30 01/24/2012   AST 30 01/24/2012   ALKPHOS 139* 01/24/2012   BILITOT 0.5 01/24/2012      Patient Active Problem List  Diagnosis  . HEPATITIS B, CHRONIC  . Benign Neoplasm of Colon  . UNSPECIFIED VITAMIN D DEFICIENCY  . HYPERLIPIDEMIA  . TOBACCO ABUSE  . OBSTRUCTIVE SLEEP APNEA  . RESTLESS LEGS SYNDROME  . DISORDER, NERVOUS SYSTEM NOS  . RHEUMATIC FEVER  . HYPERTENSION  . RHINITIS  . COPD  . SMALL BOWEL OBSTRUCTION  . OTHER CONSTIPATION  . CELLULITIS AND ABSCESS OF OTHER SPECIFIED SITE  . FOLLICULITIS  . SKIN LESION  . HIP PAIN  . BACK PAIN, LUMBAR, CHRONIC  . SCIATICA, LEFT  . INSOMNIA  . EDEMA, LOCALIZED  . NAUSEA ALONE  . INCONTINENCE  . ABDOMINAL PAIN  . PELVIC PAIN, ACUTE  . MAMMOGRAM, ABNORMAL, RIGHT  . UNS ADVRS EFF OTH RX MEDICINAL&BIOLOGICAL SBSTNC  . BIPOLAR AFFECTIVE DISORDER, HX OF  . PERSONAL HISTORY OF COLONIC POLYPS  . DYSURIA  . INTERTRIGO, CANDIDAL  . RIB PAIN, RIGHT SIDED  . Routine general medical examination at a health care facility  . Hyperglycemia  . Localized swelling, mass, or lump of lower extremity  . Preop examination  . Aortic dissection  . Anxiety  . Fatigue   Past  Medical History  Diagnosis Date  . Adenomatous colon polyp   . Insomnia   . RLS (restless legs syndrome)   . Colon polyp   . Incontinence   . Hepatitis B, chronic   . H/O: rheumatic fever   . Hyperlipidemia   . COPD (chronic obstructive pulmonary disease)   . Unspecified disorders of nervous system   . Bipolar affective disorder     h/o  . H/O: CVA (cardiovascular accident)     TIA  . Fatty liver 04/09/08    found in abd CT  . Shortness of breath   . Obstructive sleep apnea     not using CPAP, last sleep study 2.5 years ago, per pt doctor is aware  . Blood transfusion 2000  . GERD (gastroesophageal reflux disease)   . Arthritis   . Aortic dissection     Type 1   Past Surgical History  Procedure Date  . Back surgery   . Cholecystectomy   . Rotator cuff repair   . Abdominal hysterectomy     total  . Lumbar fusion 10/09  . Hemorroidectomy     and colon polyp removed  . Abdominal exploration surgery   . Posterior cervical fusion/foraminotomy   . Eye surgery     bilat  . Lumbar wound debridement 05/09/2011  Procedure: LUMBAR WOUND DEBRIDEMENT;  Surgeon: Dahlia Bailiff;  Location: [facility];  Service: Orthopedics;  Laterality: N/A;  IRRIGATION AND DEBRIDEMENT SPINAL WOUND  . Axillary artery cannulation via 8-mm hemashield graft, median sternotomy, extracorporeal circulation with deep hypothermic circulatory arrest, repair of  aortic dessection 01/23/2009    Dr Arlyce Dice   History  Substance Use Topics  . Smoking status: Current Everyday Smoker -- 0.5 packs/day  . Smokeless tobacco: Not on file  . Alcohol Use: No   Family History  Problem Relation Age of Onset  . Colon cancer Neg Hx   . Breast cancer Mother   . Lung cancer Mother   . Stomach cancer Father   . Diabetes      4 aunts, and 1 uncle   No Known Allergies Current Outpatient Prescriptions on File Prior to Visit  Medication Sig Dispense Refill  . ABILIFY 30 MG tablet TAKE 1 TABLET BY MOUTH EVERY MORNING  30  tablet  0  . celecoxib (CELEBREX) 200 MG capsule Take 200 mg by mouth daily.      . clonazePAM (KLONOPIN) 0.5 MG tablet Take 1 tablet (0.5 mg total) by mouth 2 (two) times daily as needed for anxiety.  60 tablet  0  . diazepam (VALIUM) 5 MG tablet Take one by mouth twice a day as needed for muscle spasms.  60 tablet  0  . docusate sodium (COLACE) 100 MG capsule TAKE 1 CAPSULE BY MOUTH 2 TIMES DAILY AS NEEDED FOR CONSTIPATION  60 capsule  3  . estradiol (VIVELLE-DOT) 0.1 MG/24HR Place 1 patch (0.1 mg total) onto the skin 2 (two) times a week.  24 patch  12  . HYDROcodone-acetaminophen (NORCO) 10-325 MG per tablet Take 1 tablet by mouth. Every 6-8 hours as needed for pain       . lamoTRIgine (LAMICTAL) 100 MG tablet Take 3 tablets by mouth once daily      . metFORMIN (GLUCOPHAGE-XR) 500 MG 24 hr tablet TAKE 1 TABLET (500 MG TOTAL) BY MOUTH DAILY WITH BREAKFAST.  30 tablet  6  . mirtazapine (REMERON) 15 MG tablet Take 15 mg by mouth at bedtime.      Marland Kitchen nystatin (MYCOSTATIN/NYSTOP) 100000 UNIT/GM POWD APPLY TOPICALLY 2 TIMES DAILY.AS DIRECTED  15 g  0  . omeprazole (PRILOSEC) 40 MG capsule Take 1 capsule (40 mg total) by mouth daily.  90 capsule  3  . oxyCODONE-acetaminophen (PERCOCET/ROXICET) 5-325 MG per tablet Take one by mouth at bedtime      . promethazine (PHENERGAN) 25 MG tablet TAKE ONE-HALF OR ONE TABLET BY MOUTH EVERY 8 HOURS AS NEEDED FOR NAUSEA  30 tablet  1  . QUEtiapine (SEROQUEL) 300 MG tablet Take 300 mg by mouth 3 (three) times daily.        Marland Kitchen rOPINIRole (REQUIP) 3 MG tablet Take 3 mg by mouth at bedtime.      . ropinirole (REQUIP) 5 MG tablet Take 1 tablet (5 mg total) by mouth at bedtime.  30 tablet  3  . vitamin D, CHOLECALCIFEROL, 400 UNITS tablet Take two by mouth daily  6 tablet  6  . zolpidem (AMBIEN) 10 MG tablet Take 10 mg by mouth at bedtime as needed. For sleep      . furosemide (LASIX) 20 MG tablet Take 1 tablet (20 mg total) by mouth daily.  30 tablet  3  . potassium  chloride (K-DUR) 10 MEQ tablet Take one tablet daily only when you are taking your lasix  30 tablet  0   The PMH, PSH, Social History, Family History, Medications, and allergies have been reviewed in [facility], and have been updated if relevant.   Review of Systems See HPI  No nausea, vomiting or diarrhea. No CP or SOB    Objective:   Physical Exam BP 130/82  Pulse 100  Temp 97.5 F (36.4 C)  Wt 207 lb (93.895 kg)  Wt Readings from Last 3 Encounters:  02/02/12 207 lb (93.895 kg)  01/24/12 201 lb (91.173 kg)  12/01/11 190 lb (86.183 kg)    Gen:  Alert, pleasant, NAD, no jaundice but does have some facial edma HEENT: neck supple, no nodules, non tender Abd:  Soft but diffuse tender, specifically over epigastric area, some guarding, no rebound,+fluid wave. CVS:  RRR Ext:  Trace edema bilaterally    Assessment & Plan:   1. ABDOMINAL PAIN  With persistent weight gain  Likely due to cirrhosis.  She is seeing Dr. Deatra Ina tomorrow. .   2. EDEMA, LOCALIZED  See above. Start on Lasix 20 mg daily x 1 week with Kdur 10 meq daily while taking lasix.     3. Cirrhosis of liver    See CT report- likely due to fatty liver and hepatitis (chronic). Start diuretic. Keep appt with GI.

## 2012-02-03 ENCOUNTER — Encounter: Payer: Self-pay | Admitting: Gastroenterology

## 2012-02-03 ENCOUNTER — Other Ambulatory Visit (INDEPENDENT_AMBULATORY_CARE_PROVIDER_SITE_OTHER): Payer: Medicare Other

## 2012-02-03 ENCOUNTER — Ambulatory Visit (INDEPENDENT_AMBULATORY_CARE_PROVIDER_SITE_OTHER): Payer: Medicare Other | Admitting: Gastroenterology

## 2012-02-03 VITALS — BP 128/64 | HR 88 | Ht 64.0 in | Wt 205.0 lb

## 2012-02-03 DIAGNOSIS — K7460 Unspecified cirrhosis of liver: Secondary | ICD-10-CM | POA: Diagnosis not present

## 2012-02-03 LAB — HEPATITIS B SURFACE ANTIBODY,QUALITATIVE: Hep B S Ab: NEGATIVE

## 2012-02-03 LAB — HEPATITIS C ANTIBODY: HCV Ab: NEGATIVE

## 2012-02-03 NOTE — Assessment & Plan Note (Signed)
Patient has CT evidence for cirrhosis. History is noteworthy for hepatitis B. and hepatic steatosis. I suspect that started changes are more likely related to [place] than to chronic viral infection.  There is no evidence for ascites.  Recommendations #1 check serologies for hepatitis A, B, and C and vaccinate accordingly #2 upper endoscopy to rule out varices #3 to consider percutaneous liver biopsy pending results of above. Should she have chronic active hepatitis related to hepatitis B then therapy would be in order

## 2012-02-03 NOTE — Patient Instructions (Addendum)
You will go to the basement for labs today Your Endoscopy has been scheduled Separate instructions have been given

## 2012-02-03 NOTE — Progress Notes (Signed)
History of Present Illness: This 61 year old white female with history of hepatitis B, COPD, CVA, colon polyps, fatty liver and GERD referred at the request of Dr. Clarene Duke for evaluation of possible cirrhosis. Recent complaints of  swelling of the abdominal and feet prompted a CT scan, which I reviewed, that demonstrated nodularity of the liver and borderline splenomegaly suggestive of cirrhosis. Hepatic steatosis was also seen.  There was no ascites. Patient has a remote history of hepatitis B.  Endoscopy in 2011 showed gastritis.  Colonoscopy in 2000 he was negative. There is no history of alcohol abuse or IV drug use.    Past Medical History  Diagnosis Date  . Adenomatous colon polyp   . Insomnia   . RLS (restless legs syndrome)   . Colon polyp   . Incontinence   . Hepatitis B, chronic   . H/O: rheumatic fever   . Hyperlipidemia   . COPD (chronic obstructive pulmonary disease)   . Unspecified disorders of nervous system   . Bipolar affective disorder     h/o  . H/O: CVA (cardiovascular accident)     TIA  . Fatty liver 04/09/08    found in abd CT  . Shortness of breath   . Obstructive sleep apnea     not using CPAP, last sleep study 2.5 years ago, per pt doctor is aware  . Blood transfusion 2000  . GERD (gastroesophageal reflux disease)   . Arthritis   . Aortic dissection     Type 1   Past Surgical History  Procedure Date  . Back surgery     x 4   . Cholecystectomy   . Rotator cuff repair   . Abdominal hysterectomy     total  . Lumbar fusion 10/09  . Hemorroidectomy     and colon polyp removed  . Abdominal exploration surgery   . Posterior cervical fusion/foraminotomy   . Eye surgery     bilat  . Lumbar wound debridement 05/09/2011    Procedure: LUMBAR WOUND DEBRIDEMENT;  Surgeon: Dahlia Bailiff;  Location: [facility];  Service: Orthopedics;  Laterality: N/A;  IRRIGATION AND DEBRIDEMENT SPINAL WOUND  . Axillary artery cannulation via 8-mm hemashield graft, median sternotomy,  extracorporeal circulation with deep hypothermic circulatory arrest, repair of  aortic dessection 01/23/2009    Dr Arlyce Dice   family history includes Breast cancer in her mother; Diabetes in an unspecified family member; Lung cancer in her mother; and Stomach cancer in her father.  There is no history of Colon cancer. Current Outpatient Prescriptions  Medication Sig Dispense Refill  . ABILIFY 30 MG tablet TAKE 1 TABLET BY MOUTH EVERY MORNING  30 tablet  0  . celecoxib (CELEBREX) 200 MG capsule Take 200 mg by mouth daily.      . diazepam (VALIUM) 5 MG tablet Take one by mouth twice a day as needed for muscle spasms.  60 tablet  0  . docusate sodium (COLACE) 100 MG capsule TAKE 1 CAPSULE BY MOUTH 2 TIMES DAILY AS NEEDED FOR CONSTIPATION  60 capsule  3  . estradiol (VIVELLE-DOT) 0.1 MG/24HR Place 1 patch (0.1 mg total) onto the skin 2 (two) times a week.  24 patch  12  . furosemide (LASIX) 20 MG tablet Take 1 tablet (20 mg total) by mouth daily.  30 tablet  3  . lamoTRIgine (LAMICTAL) 100 MG tablet Take 3 tablets by mouth once daily      . metFORMIN (GLUCOPHAGE-XR) 500 MG 24 hr tablet TAKE 1  TABLET (500 MG TOTAL) BY MOUTH DAILY WITH BREAKFAST.  30 tablet  6  . mirtazapine (REMERON) 15 MG tablet Take 15 mg by mouth at bedtime.      Marland Kitchen nystatin (MYCOSTATIN/NYSTOP) 100000 UNIT/GM POWD APPLY TOPICALLY 2 TIMES DAILY.AS DIRECTED  15 g  0  . omeprazole (PRILOSEC) 40 MG capsule Take 1 capsule (40 mg total) by mouth daily.  90 capsule  3  . oxyCODONE-acetaminophen (PERCOCET/ROXICET) 5-325 MG per tablet as needed. Take one by mouth at bedtime      . potassium chloride (K-DUR) 10 MEQ tablet Take one tablet daily only when you are taking your lasix  30 tablet  0  . promethazine (PHENERGAN) 25 MG tablet TAKE ONE-HALF OR ONE TABLET BY MOUTH EVERY 8 HOURS AS NEEDED FOR NAUSEA  30 tablet  1  . QUEtiapine (SEROQUEL) 300 MG tablet Take 300 mg by mouth 3 (three) times daily.        . ropinirole (REQUIP) 5 MG tablet Take  1 tablet (5 mg total) by mouth at bedtime.  30 tablet  3  . vitamin D, CHOLECALCIFEROL, 400 UNITS tablet Take two by mouth daily  6 tablet  6  . zolpidem (AMBIEN) 10 MG tablet Take 10 mg by mouth at bedtime as needed. For sleep      . DISCONTD: rOPINIRole (REQUIP) 3 MG tablet Take 3 mg by mouth at bedtime.       Allergies as of 02/03/2012  . (No Known Allergies)    reports that she has been smoking.  She has never used smokeless tobacco. She reports that she does not drink alcohol or use illicit drugs.     Review of Systems: She complains of fatigue and discomfort in the subxiphoid area. Pertinent positive and negative review of systems were noted in the above HPI section. All other review of systems were otherwise negative.  Vital signs were reviewed in today's medical record Physical Exam: General: She is an obese female in no acute distress Head: Normocephalic and atraumatic Eyes:  sclerae anicteric, EOMI Ears: Normal auditory acuity Mouth: No deformity or lesions Neck: Supple, no masses or thyromegaly Lungs: Clear throughout to auscultation Heart: Regular rate and rhythm; no murmurs, rubs or bruits Abdomen: Soft,and non distended. Abdomen is protuberant. There is a prominent venous pattern. No masses, hepatosplenomegaly or hernias noted. Normal Bowel sounds Rectal:deferred Musculoskeletal: Symmetrical with no gross deformities  Skin: No lesions on visible extremities Pulses:  Normal pulses noted Extremities: No clubbing, cyanosis,  or deformities noted. There is trace pedal edema Neurological: Alert oriented x 4, grossly nonfocal Cervical Nodes:  No significant cervical adenopathy Inguinal Nodes: No significant inguinal adenopathy Psychological:  Alert and cooperative. Normal mood and affect

## 2012-02-07 ENCOUNTER — Encounter: Payer: Self-pay | Admitting: Gastroenterology

## 2012-02-07 ENCOUNTER — Ambulatory Visit (AMBULATORY_SURGERY_CENTER): Payer: Medicare Other | Admitting: Gastroenterology

## 2012-02-07 VITALS — BP 116/67 | HR 97 | Temp 97.5°F | Resp 16 | Ht 64.0 in | Wt 205.0 lb

## 2012-02-07 DIAGNOSIS — G2581 Restless legs syndrome: Secondary | ICD-10-CM | POA: Diagnosis not present

## 2012-02-07 DIAGNOSIS — K769 Liver disease, unspecified: Secondary | ICD-10-CM | POA: Diagnosis not present

## 2012-02-07 DIAGNOSIS — K7460 Unspecified cirrhosis of liver: Secondary | ICD-10-CM | POA: Diagnosis not present

## 2012-02-07 DIAGNOSIS — I10 Essential (primary) hypertension: Secondary | ICD-10-CM | POA: Diagnosis not present

## 2012-02-07 DIAGNOSIS — G4733 Obstructive sleep apnea (adult) (pediatric): Secondary | ICD-10-CM | POA: Diagnosis not present

## 2012-02-07 DIAGNOSIS — I8500 Esophageal varices without bleeding: Secondary | ICD-10-CM | POA: Diagnosis not present

## 2012-02-07 DIAGNOSIS — K739 Chronic hepatitis, unspecified: Secondary | ICD-10-CM | POA: Diagnosis not present

## 2012-02-07 MED ORDER — SODIUM CHLORIDE 0.9 % IV SOLN: 500.0000 mL | INTRAVENOUS | Status: DC

## 2012-02-07 NOTE — Progress Notes (Signed)
Patient did not experience any of the following events: a burn prior to discharge; a fall within the facility; wrong site/side/patient/procedure/implant event; or a hospital transfer or hospital admission upon discharge from the facility. (G8907) Patient did not have preoperative order for IV antibiotic SSI prophylaxis. (G8918)  

## 2012-02-07 NOTE — Progress Notes (Signed)
No allergies to eggs or soy products.  Pt takes Metformin; she states she is not a diabetic and I could not find that she was in her records.  Doesn't check her sugars

## 2012-02-07 NOTE — Progress Notes (Signed)
The pt tolerated the egd well.  [facility], CRNA held the pt's airway post procedure until pt more awake. Maw

## 2012-02-07 NOTE — Patient Instructions (Addendum)
Discharge instructions given with verbal understanding. Normal exam. Resume previous medications. YOU HAD AN ENDOSCOPIC PROCEDURE TODAY AT THE [facility]: Refer to the procedure report that was given to you for any specific questions about what was found during the examination.  If the procedure report does not answer your questions, please call your gastroenterologist to clarify.  If you requested that your care partner not be given the details of your procedure findings, then the procedure report has been included in a sealed envelope for you to review at your convenience later.  YOU SHOULD EXPECT: Some feelings of bloating in the abdomen. Passage of more gas than usual.  Walking can help get rid of the air that was put into your GI tract during the procedure and reduce the bloating. If you had a lower endoscopy (such as a colonoscopy or flexible sigmoidoscopy) you may notice spotting of blood in your stool or on the toilet paper. If you underwent a bowel prep for your procedure, then you may not have a normal bowel movement for a few days.  DIET: Your first meal following the procedure should be a light meal and then it is ok to progress to your normal diet.  A half-sandwich or bowl of soup is an example of a good first meal.  Heavy or fried foods are harder to digest and may make you feel nauseous or bloated.  Likewise meals heavy in dairy and vegetables can cause extra gas to form and this can also increase the bloating.  Drink plenty of fluids but you should avoid alcoholic beverages for 24 hours.  ACTIVITY: Your care partner should take you home directly after the procedure.  You should plan to take it easy, moving slowly for the rest of the day.  You can resume normal activity the day after the procedure however you should NOT DRIVE or use heavy machinery for 24 hours (because of the sedation medicines used during the test).    SYMPTOMS TO REPORT IMMEDIATELY: A gastroenterologist  can be reached at any hour.  During normal business hours, 8:30 AM to 5:00 PM Monday through Friday, call [phone].  After hours and on weekends, please call the GI answering service at [phone] who will take a message and have the physician on call contact you.   Following upper endoscopy (EGD)  Vomiting of blood or coffee ground material  New chest pain or pain under the shoulder blades  Painful or persistently difficult swallowing  New shortness of breath  Fever of 100F or higher  Black, tarry-looking stools  FOLLOW UP: If any biopsies were taken you will be contacted by phone or by letter within the next 1-3 weeks.  Call your gastroenterologist if you have not heard about the biopsies in 3 weeks.  Our staff will call the home number listed on your records the next business day following your procedure to check on you and address any questions or concerns that you may have at that time regarding the information given to you following your procedure. This is a courtesy call and so if there is no answer at the home number and we have not heard from you through the emergency physician on call, we will assume that you have returned to your regular daily activities without incident.  SIGNATURES/CONFIDENTIALITY: You and/or your care partner have signed paperwork which will be entered into your electronic medical record.  These signatures attest to the fact that that the information above on your After   Visit Summary has been reviewed and is understood.  Full responsibility of the confidentiality of this discharge information lies with you and/or your care-partner. 

## 2012-02-07 NOTE — Progress Notes (Signed)
Pt airway suctioned by [facility], CRNA while giving the propofol and getting her sleepy. Stephanie [name]

## 2012-02-07 NOTE — Op Note (Signed)
[place]  [address] [place], [address]   ENDOSCOPY PROCEDURE REPORT  PATIENT: Stephanie, [name]  MR#: [id number] BIRTHDATE: 1950-10-20 , 61  yrs. old GENDER: Female ENDOSCOPIST: Inda Castle, MD REFERRED BY:  [name], M.D. PROCEDURE DATE:  02/07/2012 PROCEDURE:  EGD, diagnostic ASA CLASS:     Class III INDICATIONS:  screening for varices. MEDICATIONS: propofol (Diprivan) 140m IV and Simethicone 0.6cc PO  TOPICAL ANESTHETIC: Cetacaine Spray  DESCRIPTION OF PROCEDURE: After the risks benefits and alternatives of the procedure were thoroughly explained, informed consent was obtained.  The LB [id number]-[id number] was introduced through the mouth and advanced to the second portion of the duodenum. Without limitations.  The instrument was slowly withdrawn as the mucosa was fully examined.    The upper, middle and distal third of the esophagus were carefully inspected and no abnormalities were noted.  The z-line was well seen at the GEJ.  The endoscope was pushed into the fundus which was normal including a retroflexed view.  The antrum, gastric body, first and second part of the duodenum were unremarkable. Retroflexed views revealed no abnormalities.     The scope was then withdrawn from the patient and the procedure completed.  COMPLICATIONS: There were no complications. ENDOSCOPIC IMPRESSION: Normal EGD  RECOMMENDATIONS: endoscopy 1 year  REPEAT EXAM: for EGD 1 year  eSigned:  RInda Castle MD 02/07/2012 9:24 AM   CC:

## 2012-02-08 ENCOUNTER — Telehealth: Payer: Self-pay | Admitting: Radiology

## 2012-02-08 ENCOUNTER — Telehealth: Payer: Self-pay

## 2012-02-08 NOTE — Telephone Encounter (Signed)
I called and canceled the lab appt for Thursday, the patient asked if she should still be taking , Lasix, K+, and Vitamin D. I told her to follow whatever instructions you had given her or any changes that GI had done. Also, she has an appt for labs in Oct. To check Vit D. Please advise

## 2012-02-08 NOTE — Telephone Encounter (Signed)
  Follow up Call-  Call back number 02/07/2012  Post procedure Call Back phone  # [phone]  Permission to leave phone message Yes     Patient questions:  Do you have a fever, pain , or abdominal swelling? no Pain Score  0 *  Have you tolerated food without any problems? yes  Have you been able to return to your normal activities? yes  Do you have any questions about your discharge instructions: Diet   no Medications  no Follow up visit  no  Do you have questions or concerns about your Care? no  Actions: * If pain score is 4 or above: No action needed, pain <4.  Per the pt, "I did just fine". Maw

## 2012-02-09 ENCOUNTER — Other Ambulatory Visit (INDEPENDENT_AMBULATORY_CARE_PROVIDER_SITE_OTHER): Payer: Medicare Other

## 2012-02-09 ENCOUNTER — Other Ambulatory Visit: Payer: Medicare Other

## 2012-02-09 DIAGNOSIS — R609 Edema, unspecified: Secondary | ICD-10-CM | POA: Diagnosis not present

## 2012-02-09 LAB — BASIC METABOLIC PANEL
BUN: 16 mg/dL (ref 6–23)
CO2: 28 mEq/L (ref 19–32)
Chloride: 103 mEq/L (ref 96–112)
Creatinine, Ser: 1 mg/dL (ref 0.4–1.2)
Glucose, Bld: 93 mg/dL (ref 70–99)
Potassium: 4.2 mEq/L (ref 3.5–5.1)

## 2012-02-09 NOTE — Telephone Encounter (Signed)
It looks like GI did not change Lasix. Sorry to change plans again- please have her come in at her convenience for BMET only (dx- 782.3)

## 2012-02-09 NOTE — Telephone Encounter (Signed)
Lab appt today @ 10:30

## 2012-02-10 IMAGING — CT CT CHEST W/O CM
3 of 4 series · 16 of 30 positions shown, 17 images · non-contrast
Comparison: Chest CTs 03/11/2010 and 09/22/2010.

CLINICAL DATA: Follow-up lung lesion.  History of smoking and
cough.  No history of malignancy.

CT CHEST WITHOUT CONTRAST
TECHNIQUE: Multidetector CT imaging of the chest was performed
following the standard protocol without IV contrast.

[Series 3: chest w/o · axial · non-contrast · 0.70mm/px · z∈[-182,-32]mm · 4 of 52 slices shown, 5 images]
[im 11/52  mediastinal]
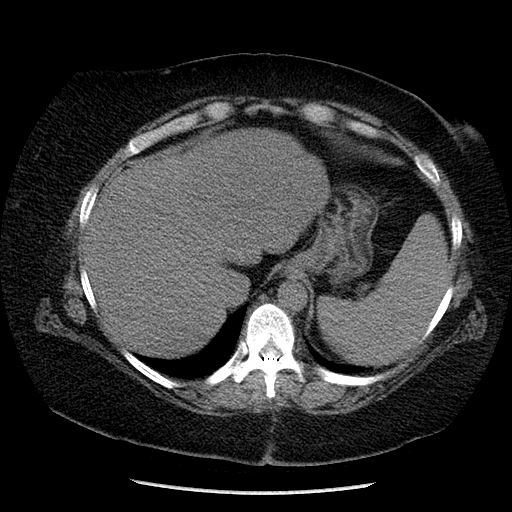
[im 11/52  lung]
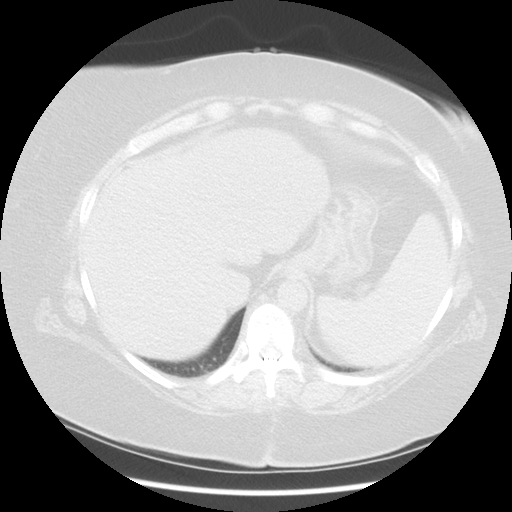
[im 21/52  lung]
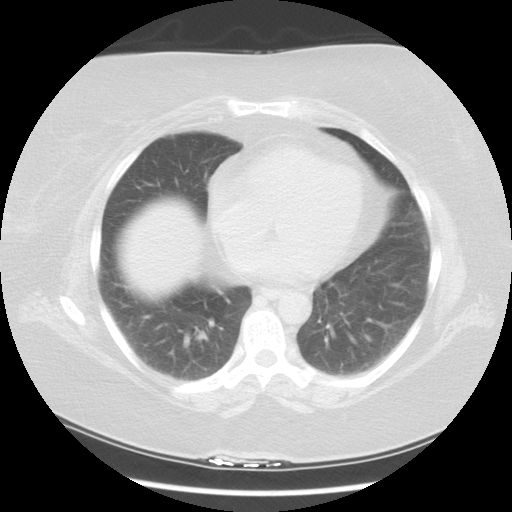
[im 31/52  lung]
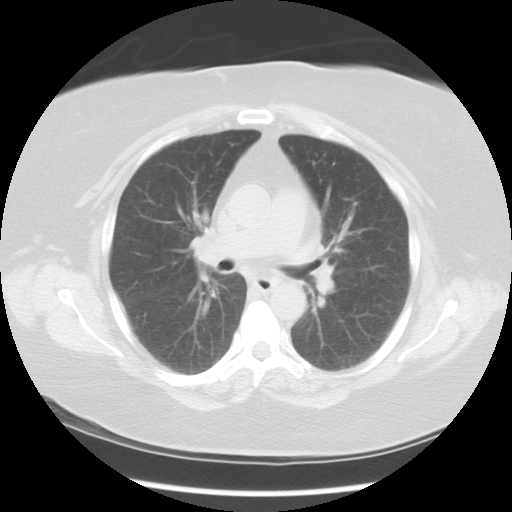
[im 41/52  lung]
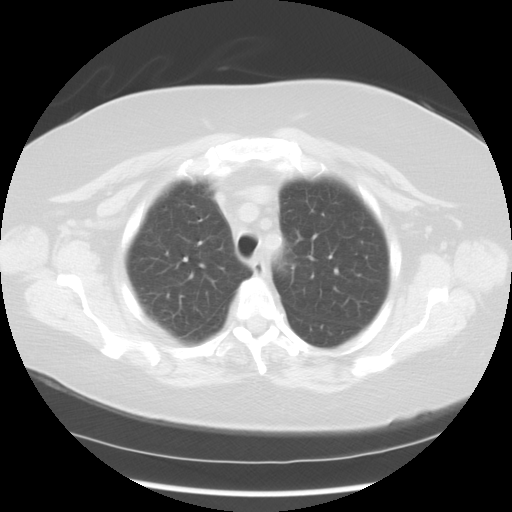

[Series 4: lung windows · axial · 0.70mm/px · z∈[-182,-32]mm · 4 of 52 slices shown]
[im 11/52  lung]
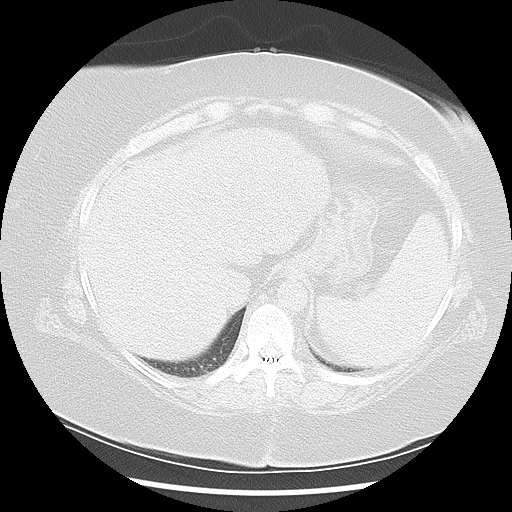
[im 21/52  lung]
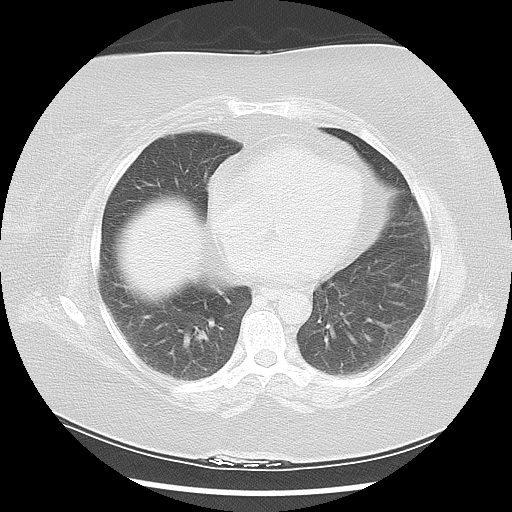
[im 31/52  lung]
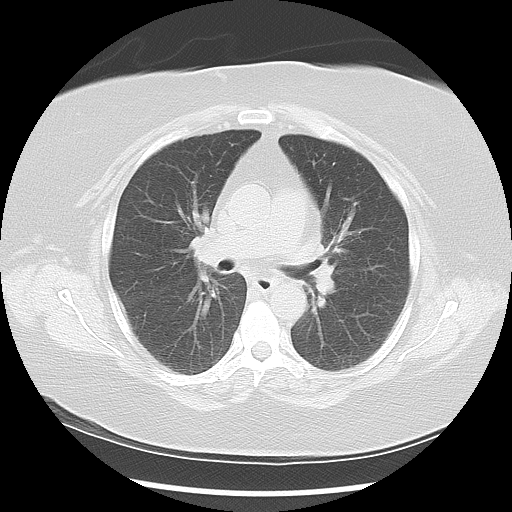
[im 41/52  lung]
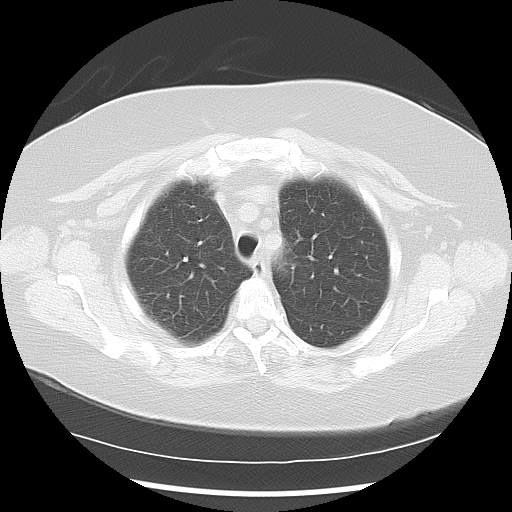

[Series 602: sagittal body · sagittal · 0.70mm/px · 8 of 145 slices shown]
[im 10/145  mediastinal]
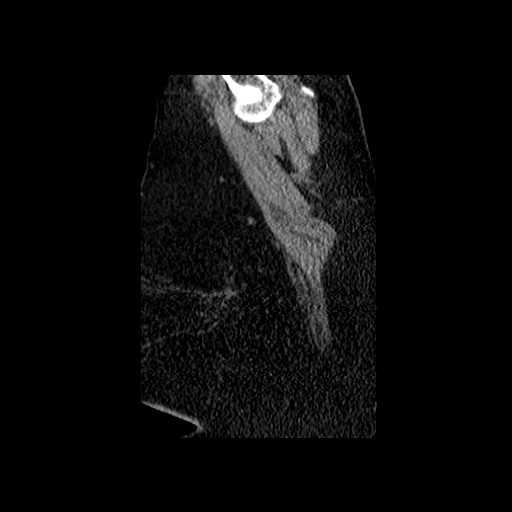
[im 29/145  mediastinal]
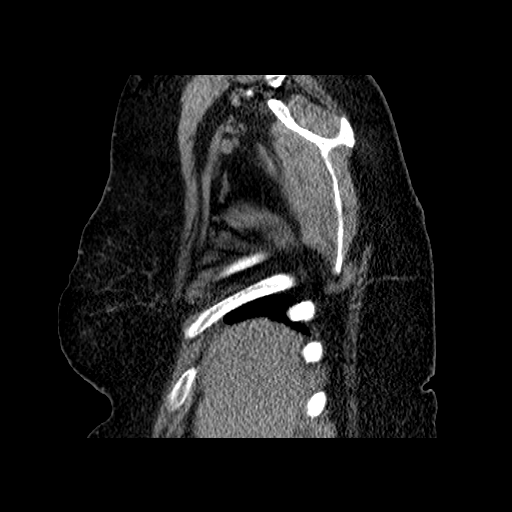
[im 49/145  mediastinal]
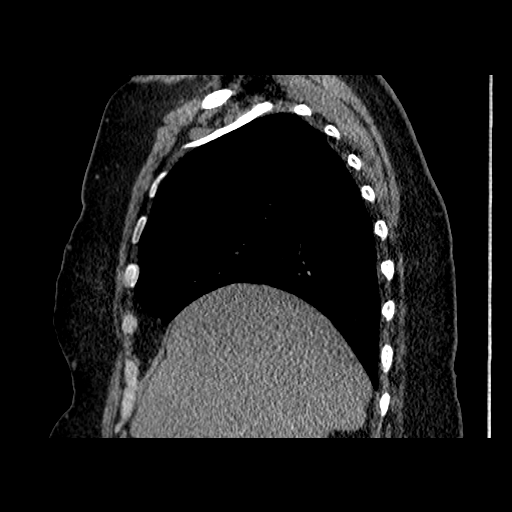
[im 68/145  mediastinal]
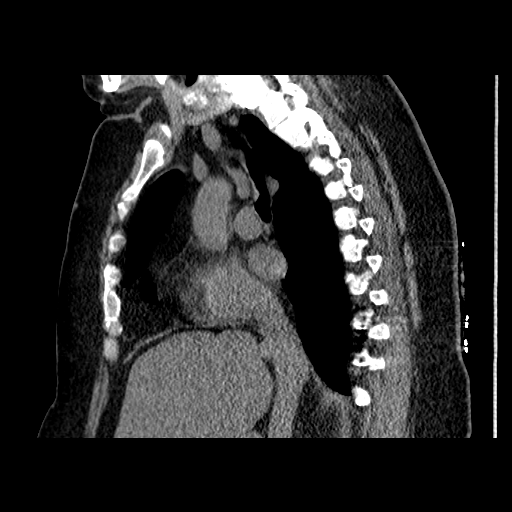
[im 77/145  mediastinal]
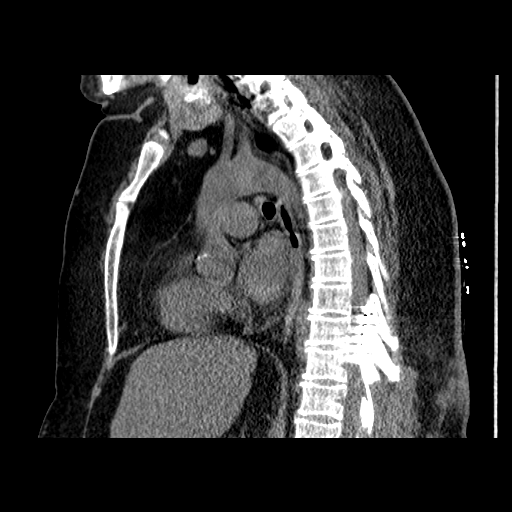
[im 97/145  mediastinal]
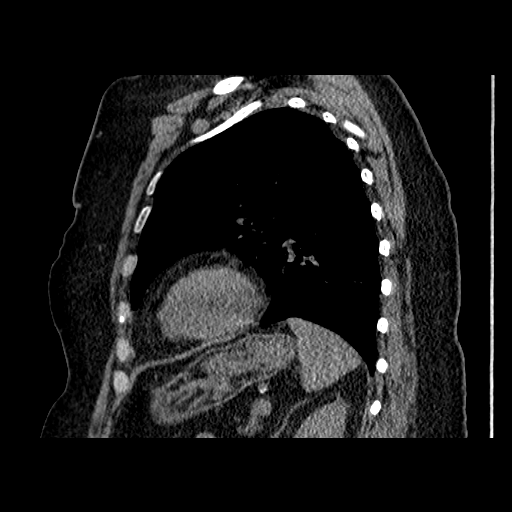
[im 116/145  mediastinal]
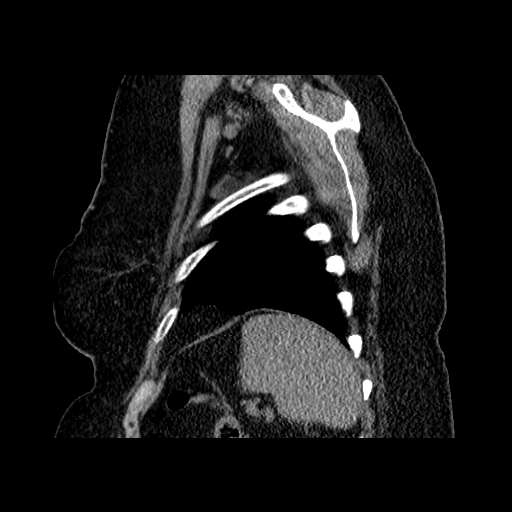
[im 135/145  mediastinal]
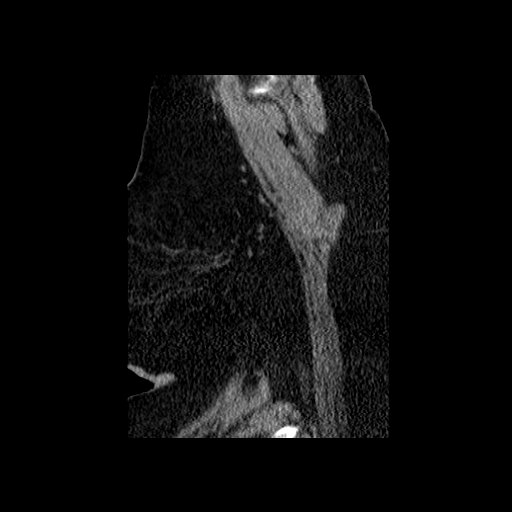

[16 of 30 positions shown; findings below may reference images not displayed]

FINDINGS: The mediastinum appears stable.  There is an 8 mm right
paratracheal node on image 17 which is unchanged.  There are no
enlarged mediastinal or hilar lymph nodes.  Coronary artery
calcifications are present.

There is no pleural or pericardial effusion.  The previously
referenced right apical nodule is actually a focal ground-glass
opacity measuring approximately 7 mm in diameter on image 8.  This
does not appear significantly changed from the prior examinations.
A 3 mm left lower lobe nodule on image 31 is stable.  There are no
new or enlarging pulmonary nodules.

There has been interval placement of a lower thoracic spinal
stimulator.  The liver demonstrates diffuse steatosis with stable
mild contour irregularity.  No adrenal mass is identified.
IMPRESSION: 1.  Stable right apical ground-glass opacity. Based on location,
this may reflect a focus of postinflammatory scarring.  However,
continued annual follow-up is recommended to assess stability.
This recommendation is taken from the following article:  Subsolid
Pulmonary Nodules and the Spectrum of Peripheral Adenocarcinomas of
the Lung:  Recommended Interim Guidelines for Assessment and
Management.  Radiology May 2008; [DATE].
2.  Stable 3 mm left lower lobe pulmonary nodule.
3.  Hepatic steatosis.

## 2012-02-14 ENCOUNTER — Other Ambulatory Visit: Payer: Self-pay | Admitting: Family Medicine

## 2012-02-15 DIAGNOSIS — Z23 Encounter for immunization: Secondary | ICD-10-CM | POA: Diagnosis not present

## 2012-02-22 ENCOUNTER — Other Ambulatory Visit: Payer: Self-pay | Admitting: *Deleted

## 2012-02-22 MED ORDER — CLONAZEPAM 0.5 MG PO TABS: 0.5000 mg | ORAL_TABLET | Freq: Two times a day (BID) | ORAL | Status: DC | PRN

## 2012-02-22 NOTE — Telephone Encounter (Signed)
[facility]

## 2012-02-22 NOTE — Telephone Encounter (Signed)
Faxed refill request from [facility]  [address] for clonazepam 0.5 mg's, one twice daily.  Last filled 01/16/12.  This is no longer on med list, looks like removed from list by CMA at another site, saying course completed.  Please advise.

## 2012-02-22 NOTE — Telephone Encounter (Signed)
Ok to refill one month supply.

## 2012-02-23 ENCOUNTER — Telehealth: Payer: Self-pay | Admitting: Gastroenterology

## 2012-02-23 ENCOUNTER — Telehealth: Payer: Self-pay

## 2012-02-23 ENCOUNTER — Encounter: Payer: Self-pay | Admitting: Family Medicine

## 2012-02-23 ENCOUNTER — Ambulatory Visit (INDEPENDENT_AMBULATORY_CARE_PROVIDER_SITE_OTHER): Payer: Medicare Other | Admitting: Family Medicine

## 2012-02-23 VITALS — BP 130/76 | HR 76 | Temp 97.5°F | Wt 207.0 lb

## 2012-02-23 DIAGNOSIS — N289 Disorder of kidney and ureter, unspecified: Secondary | ICD-10-CM | POA: Diagnosis not present

## 2012-02-23 DIAGNOSIS — M545 Low back pain: Secondary | ICD-10-CM | POA: Diagnosis not present

## 2012-02-23 DIAGNOSIS — R609 Edema, unspecified: Secondary | ICD-10-CM | POA: Diagnosis not present

## 2012-02-23 DIAGNOSIS — K7460 Unspecified cirrhosis of liver: Secondary | ICD-10-CM

## 2012-02-23 DIAGNOSIS — R600 Localized edema: Secondary | ICD-10-CM

## 2012-02-23 LAB — COMPREHENSIVE METABOLIC PANEL
AST: 28 U/L (ref 0–37)
BUN: 17 mg/dL (ref 6–23)
Calcium: 9.2 mg/dL (ref 8.4–10.5)
Chloride: 105 mEq/L (ref 96–112)
Creatinine, Ser: 0.8 mg/dL (ref 0.4–1.2)
Glucose, Bld: 113 mg/dL — ABNORMAL HIGH (ref 70–99)

## 2012-02-23 NOTE — Telephone Encounter (Signed)
Pt seen today and forgot to ask who will treat cirrhosis of liver? Advised Dr Deborra Medina is waiting for further recommendation from Dr Deatra Ina. Pt request call back for next step.

## 2012-02-23 NOTE — Telephone Encounter (Signed)
Pt wanted to get the results of her EGD. Discussed with pt that her EGD was normal, no varices were found and no biopsies were taken. Pt verbalized understanding.

## 2012-02-23 NOTE — Telephone Encounter (Signed)
Yes Dr. Deatra Ina is treating her.  We will find out the next step after her follow up with him.

## 2012-02-23 NOTE — Progress Notes (Signed)
Subjective:    Patient ID: Stephanie [name], female    DOB: [birth year], 61 y.o.   MRN: [id number]  HPI  61 yo with complicated medical history here for follow up.  Recently diagnosed with cirrhosis (see CT scan from 01/24/12).  Referred to GI-  Notes and studies reviewed.  Saw Dr. Deatra Ina who felt  changes are more likely related to [facility] than to chronic viral infection.   Does have remote h/o Hep B.  Hep A was positive.  EGD- neg, no varices. Per pt, has follow up with Dr. Deatra Ina in 3 months.  Has swelling now in her face, on and off in her legs bilaterally, right >left and abdomen.  Started lasix earlier this month for edema. Cr increased to 1.0 (still normal) but GFR decreased to 59.14. Lab Results  Component Value Date   CREATININE 1.0 02/09/2012  Advised her to STOP the lasix and potassium.  Here for follow up. Swelling is about the same.   Her biggest complaint today is low back pain.  She has had multiple back surgeries- followed by Dr. Rolena Infante and Dr. Nelva Bush. Dr. Rolena Infante has been managing her pain with narcotics. No radiculopathy but she feels pain is getting worse.    Patient Active Problem List  Diagnosis  . HEPATITIS B, CHRONIC  . Benign Neoplasm of Colon  . UNSPECIFIED VITAMIN D DEFICIENCY  . HYPERLIPIDEMIA  . TOBACCO ABUSE  . OBSTRUCTIVE SLEEP APNEA  . RESTLESS LEGS SYNDROME  . DISORDER, NERVOUS SYSTEM NOS  . RHEUMATIC FEVER  . HYPERTENSION  . RHINITIS  . COPD  . SMALL BOWEL OBSTRUCTION  . OTHER CONSTIPATION  . CELLULITIS AND ABSCESS OF OTHER SPECIFIED SITE  . FOLLICULITIS  . SKIN LESION  . HIP PAIN  . BACK PAIN, LUMBAR, CHRONIC  . SCIATICA, LEFT  . INSOMNIA  . EDEMA, LOCALIZED  . NAUSEA ALONE  . INCONTINENCE  . ABDOMINAL PAIN  . PELVIC PAIN, ACUTE  . MAMMOGRAM, ABNORMAL, RIGHT  . UNS ADVRS EFF OTH RX MEDICINAL&BIOLOGICAL SBSTNC  . BIPOLAR AFFECTIVE DISORDER, HX OF  . PERSONAL HISTORY OF COLONIC POLYPS  . DYSURIA  . INTERTRIGO, CANDIDAL  . RIB  PAIN, RIGHT SIDED  . Routine general medical examination at a health care facility  . Hyperglycemia  . Localized swelling, mass, or lump of lower extremity  . Preop examination  . Aortic dissection  . Anxiety  . Fatigue  . Leg edema  . Cirrhosis of liver   Past Medical History  Diagnosis Date  . Adenomatous colon polyp   . Insomnia   . RLS (restless legs syndrome)   . Colon polyp   . Incontinence   . Hepatitis B, chronic   . H/O: rheumatic fever   . Hyperlipidemia   . Unspecified disorders of nervous system   . Bipolar affective disorder     h/o  . H/O: CVA (cardiovascular accident)     TIA  . Fatty liver 04/09/08    found in abd CT  . Shortness of breath   . Obstructive sleep apnea     not using CPAP, last sleep study 2.5 years ago, per pt doctor is aware  . Blood transfusion 2000  . GERD (gastroesophageal reflux disease)   . Arthritis   . Aortic dissection     Type 1  . COPD (chronic obstructive pulmonary disease)   . Anxiety   . Depression   . Heart murmur     as a child  . Stroke  TIA- 2002  . Ulcer    Past Surgical History  Procedure Date  . Back surgery     x 45  . Cholecystectomy   . Rotator cuff repair     left  . Abdominal hysterectomy     total  . Lumbar fusion 10/09  . Hemorroidectomy     and colon polyp removed  . Abdominal exploration surgery   . Posterior cervical fusion/foraminotomy   . Eye surgery     bilat  . Lumbar wound debridement 05/09/2011    Procedure: LUMBAR WOUND DEBRIDEMENT;  Surgeon: Dahlia Bailiff;  Location: [place];  Service: Orthopedics;  Laterality: N/A;  IRRIGATION AND DEBRIDEMENT SPINAL WOUND  . Axillary artery cannulation via 8-mm hemashield graft, median sternotomy, extracorporeal circulation with deep hypothermic circulatory arrest, repair of  aortic dessection 01/23/2009    Dr Arlyce Dice  . Cataract     both eyes  . Colonoscopy    History  Substance Use Topics  . Smoking status: Current Every Day Smoker -- 0.5  packs/day  . Smokeless tobacco: Never Used   Comment: Down to 1/2 a pack   . Alcohol Use: No   Family History  Problem Relation Age of Onset  . Colon cancer Neg Hx   . Esophageal cancer Neg Hx   . Rectal cancer Neg Hx   . Breast cancer Mother   . Lung cancer Mother   . Stomach cancer Father   . Diabetes      4 aunts, and 1 uncle   No Known Allergies Current Outpatient Prescriptions on File Prior to Visit  Medication Sig Dispense Refill  . ABILIFY 30 MG tablet TAKE 1 TABLET BY MOUTH EVERY MORNING  30 tablet  0  . celecoxib (CELEBREX) 200 MG capsule Take 200 mg by mouth daily.      . clonazePAM (KLONOPIN) 0.5 MG tablet Take 1 tablet (0.5 mg total) by mouth 2 (two) times daily as needed.  60 tablet  0  . diazepam (VALIUM) 5 MG tablet Take one by mouth twice a day as needed for muscle spasms.  60 tablet  0  . docusate sodium (COLACE) 100 MG capsule TAKE 1 CAPSULE BY MOUTH 2 TIMES DAILY AS NEEDED FOR CONSTIPATION  60 capsule  3  . estradiol (VIVELLE-DOT) 0.1 MG/24HR Place 1 patch (0.1 mg total) onto the skin 2 (two) times a week.  24 patch  12  . furosemide (LASIX) 20 MG tablet Take 1 tablet (20 mg total) by mouth daily.  30 tablet  3  . lamoTRIgine (LAMICTAL) 100 MG tablet Take 3 tablets by mouth once daily      . metFORMIN (GLUCOPHAGE-XR) 500 MG 24 hr tablet TAKE 1 TABLET (500 MG TOTAL) BY MOUTH DAILY WITH BREAKFAST.  30 tablet  6  . mirtazapine (REMERON) 15 MG tablet Take 15 mg by mouth at bedtime.      Marland Kitchen nystatin (MYCOSTATIN/NYSTOP) 100000 UNIT/GM POWD APPLY TOPICALLY 2 TIMES DAILY.AS DIRECTED  15 g  0  . omeprazole (PRILOSEC) 40 MG capsule Take 1 capsule (40 mg total) by mouth daily.  90 capsule  3  . oxyCODONE-acetaminophen (PERCOCET/ROXICET) 5-325 MG per tablet as needed. Take one by mouth at bedtime      . potassium chloride (K-DUR) 10 MEQ tablet Take one tablet daily only when you are taking your lasix  30 tablet  0  . promethazine (PHENERGAN) 25 MG tablet TAKE ONE-HALF OR ONE  TABLET BY MOUTH EVERY 8 HOURS AS NEEDED FOR  NAUSEA  30 tablet  0  . QUEtiapine (SEROQUEL) 300 MG tablet Take 300 mg by mouth 3 (three) times daily.        . ropinirole (REQUIP) 5 MG tablet Take 1 tablet (5 mg total) by mouth at bedtime.  30 tablet  3  . vitamin D, CHOLECALCIFEROL, 400 UNITS tablet Take two by mouth daily  6 tablet  6  . zolpidem (AMBIEN) 10 MG tablet Take 10 mg by mouth at bedtime as needed. For sleep       The PMH, PSH, Social History, Family History, Medications, and allergies have been reviewed in [facility], and have been updated if relevant.   Review of Systems See HPI  No nausea, vomiting or diarrhea. No CP or SOB    Objective:   Physical Exam BP 130/76  Pulse 76  Temp 97.5 F (36.4 C)  Wt 207 lb (93.895 kg)  Wt Readings from Last 3 Encounters:  02/23/12 207 lb (93.895 kg)  02/07/12 205 lb (92.987 kg)  02/03/12 205 lb (92.987 kg)      Gen:  Alert, pleasant, NAD, no jaundice but does have some facial edma HEENT: neck supple, no nodules, non tender Abd:  Soft but diffuse tender, specifically over epigastric area, some guarding, no rebound, no ascites CVS:  RRR MSK:  Decreased ROM of back, pain with twisting, unable to flex or extend without significant pain, SLR neg. Ext:  Trace edema bilaterally    Assessment & Plan:   1. Cirrhosis of liver  New diagnosis- likely due to [facility] although she did test positive for Hep A with Dr. Kelby Fam blood work.  Will await further recs from GI.  EGD neg.  ?possible liver bx.   2. Leg edema  Stable.   3. Acute renal insufficiency  New likely due to lasix use. Lasix d/c'ed.  Recheck CMET today. Comprehensive metabolic panel  4. BACK PAIN, LUMBAR, CHRONIC  Deteriorated.  Failing narcotics.  Will refer to pain clinic- explained to Ms. Coval that I do not manage chronic pain. The patient indicates understanding of these issues and agrees with the plan.  Ambulatory referral to Pain Clinic

## 2012-02-23 NOTE — Patient Instructions (Addendum)
Please call Dr. Nelva Bush- ask about a Lidoderm patch.  Rosaria Ferries will call you about the pain clinic.

## 2012-02-27 ENCOUNTER — Telehealth: Payer: Self-pay

## 2012-02-27 ENCOUNTER — Ambulatory Visit: Payer: Self-pay | Admitting: Family Medicine

## 2012-02-27 ENCOUNTER — Telehealth: Payer: Self-pay | Admitting: *Deleted

## 2012-02-27 ENCOUNTER — Encounter: Payer: Self-pay | Admitting: Family Medicine

## 2012-02-27 ENCOUNTER — Ambulatory Visit (INDEPENDENT_AMBULATORY_CARE_PROVIDER_SITE_OTHER): Payer: Medicare Other | Admitting: Family Medicine

## 2012-02-27 VITALS — BP 122/80 | HR 92 | Temp 97.6°F | Wt 201.0 lb

## 2012-02-27 DIAGNOSIS — M7989 Other specified soft tissue disorders: Secondary | ICD-10-CM

## 2012-02-27 DIAGNOSIS — M79606 Pain in leg, unspecified: Secondary | ICD-10-CM

## 2012-02-27 DIAGNOSIS — M79609 Pain in unspecified limb: Secondary | ICD-10-CM

## 2012-02-27 MED ORDER — CEPHALEXIN 500 MG PO CAPS: 1000.0000 mg | ORAL_CAPSULE | Freq: Two times a day (BID) | ORAL | Status: DC

## 2012-02-27 NOTE — Telephone Encounter (Signed)
No -- we discussed all this in office. Avoid lasix.  More worried this is developing infection in lower leg

## 2012-02-27 NOTE — Progress Notes (Signed)
Therapist, music at [facility] [place] [address] Phone: [phone] Fax: [phone]  Date:  02/27/2012   Name:  Stephanie [name]   DOB:  07/14/1950   MRN:  [id number] Gender: female Age: 61 y.o.  PCP:  [name], MD    Chief Complaint: Right foot swollen and red   History of Present Illness:  Stephanie [name] is a 35 y.o. pleasant patient who presents with the following:  Patient with an extensive complicated medical history including COPD, cirrhosis, history of CVA, chronic pain, who presents with right-sided lower extremity pain, mild swelling, and some warmth and change in coloration on the distal aspect of her right foot.  She has no known trauma or injury that she can recall. She denies a history of gout. She is currently systemically afebrile. No fever, chills or sweats. No nausea, vomiting, or diarrhea.  She reports a remote history of a DVT to me, but I cannot find a history of DVT in her chart. She reports that she was receiving some STD-type hose in the hospital, but no prior anticoagulants. Unclear about whether or not this actually was a prior clot.   Patient Active Problem List  Diagnosis  . HEPATITIS B, CHRONIC  . Benign Neoplasm of Colon  . UNSPECIFIED VITAMIN D DEFICIENCY  . HYPERLIPIDEMIA  . TOBACCO ABUSE  . OBSTRUCTIVE SLEEP APNEA  . RESTLESS LEGS SYNDROME  . DISORDER, NERVOUS SYSTEM NOS  . RHEUMATIC FEVER  . HYPERTENSION  . RHINITIS  . COPD  . SMALL BOWEL OBSTRUCTION  . OTHER CONSTIPATION  . CELLULITIS AND ABSCESS OF OTHER SPECIFIED SITE  . FOLLICULITIS  . SKIN LESION  . HIP PAIN  . BACK PAIN, LUMBAR, CHRONIC  . SCIATICA, LEFT  . INSOMNIA  . EDEMA, LOCALIZED  . NAUSEA ALONE  . INCONTINENCE  . ABDOMINAL PAIN  . PELVIC PAIN, ACUTE  . MAMMOGRAM, ABNORMAL, RIGHT  . UNS ADVRS EFF OTH RX MEDICINAL&BIOLOGICAL SBSTNC  . BIPOLAR AFFECTIVE DISORDER, HX OF  . PERSONAL HISTORY OF COLONIC POLYPS  . DYSURIA  . INTERTRIGO,  CANDIDAL  . RIB PAIN, RIGHT SIDED  . Routine general medical examination at a health care facility  . Hyperglycemia  . Localized swelling, mass, or lump of lower extremity  . Preop examination  . Aortic dissection  . Anxiety  . Fatigue  . Leg edema  . Cirrhosis of liver  . Acute renal insufficiency    Past Medical History  Diagnosis Date  . Adenomatous colon polyp   . Insomnia   . RLS (restless legs syndrome)   . Colon polyp   . Incontinence   . Hepatitis B, chronic   . H/O: rheumatic fever   . Hyperlipidemia   . Unspecified disorders of nervous system   . Bipolar affective disorder     h/o  . H/O: CVA (cardiovascular accident)     TIA  . Fatty liver 04/09/08    found in abd CT  . Shortness of breath   . Obstructive sleep apnea     not using CPAP, last sleep study 2.5 years ago, per pt doctor is aware  . Blood transfusion 2000  . GERD (gastroesophageal reflux disease)   . Arthritis   . Aortic dissection     Type 1  . COPD (chronic obstructive pulmonary disease)   . Anxiety   . Depression   . Heart murmur     as a child  . Stroke  TIA- 2002  . Ulcer     Past Surgical History  Procedure Date  . Back surgery     x 45  . Cholecystectomy   . Rotator cuff repair     left  . Abdominal hysterectomy     total  . Lumbar fusion 10/09  . Hemorroidectomy     and colon polyp removed  . Abdominal exploration surgery   . Posterior cervical fusion/foraminotomy   . Eye surgery     bilat  . Lumbar wound debridement 05/09/2011    Procedure: LUMBAR WOUND DEBRIDEMENT;  Surgeon: Dahlia Bailiff;  Location: [place];  Service: Orthopedics;  Laterality: N/A;  IRRIGATION AND DEBRIDEMENT SPINAL WOUND  . Axillary artery cannulation via 8-mm hemashield graft, median sternotomy, extracorporeal circulation with deep hypothermic circulatory arrest, repair of  aortic dessection 01/23/2009    Dr Arlyce Dice  . Cataract     both eyes  . Colonoscopy     History  Substance Use Topics    . Smoking status: Current Every Day Smoker -- 0.5 packs/day  . Smokeless tobacco: Never Used   Comment: Down to 1/2 a pack   . Alcohol Use: No    Family History  Problem Relation Age of Onset  . Colon cancer Neg Hx   . Esophageal cancer Neg Hx   . Rectal cancer Neg Hx   . Breast cancer Mother   . Lung cancer Mother   . Stomach cancer Father   . Diabetes      4 aunts, and 1 uncle    No Known Allergies  Medication list has been reviewed and updated.  Outpatient Prescriptions Prior to Visit  Medication Sig Dispense Refill  . ABILIFY 30 MG tablet TAKE 1 TABLET BY MOUTH EVERY MORNING  30 tablet  0  . celecoxib (CELEBREX) 200 MG capsule Take 200 mg by mouth daily.      . clonazePAM (KLONOPIN) 0.5 MG tablet Take 1 tablet (0.5 mg total) by mouth 2 (two) times daily as needed.  60 tablet  0  . diazepam (VALIUM) 5 MG tablet Take one by mouth twice a day as needed for muscle spasms.  60 tablet  0  . docusate sodium (COLACE) 100 MG capsule TAKE 1 CAPSULE BY MOUTH 2 TIMES DAILY AS NEEDED FOR CONSTIPATION  60 capsule  3  . estradiol (VIVELLE-DOT) 0.1 MG/24HR Place 1 patch (0.1 mg total) onto the skin 2 (two) times a week.  24 patch  12  . furosemide (LASIX) 20 MG tablet Take 1 tablet (20 mg total) by mouth daily.  30 tablet  3  . lamoTRIgine (LAMICTAL) 100 MG tablet Take 3 tablets by mouth once daily      . metFORMIN (GLUCOPHAGE-XR) 500 MG 24 hr tablet TAKE 1 TABLET (500 MG TOTAL) BY MOUTH DAILY WITH BREAKFAST.  30 tablet  6  . mirtazapine (REMERON) 15 MG tablet Take 15 mg by mouth at bedtime.      Marland Kitchen nystatin (MYCOSTATIN/NYSTOP) 100000 UNIT/GM POWD APPLY TOPICALLY 2 TIMES DAILY.AS DIRECTED  15 g  0  . omeprazole (PRILOSEC) 40 MG capsule Take 1 capsule (40 mg total) by mouth daily.  90 capsule  3  . oxyCODONE-acetaminophen (PERCOCET/ROXICET) 5-325 MG per tablet as needed. Take one by mouth at bedtime      . potassium chloride (K-DUR) 10 MEQ tablet Take one tablet daily only when you are  taking your lasix  30 tablet  0  . promethazine (PHENERGAN) 25 MG tablet TAKE ONE-HALF  OR ONE TABLET BY MOUTH EVERY 8 HOURS AS NEEDED FOR NAUSEA  30 tablet  0  . QUEtiapine (SEROQUEL) 300 MG tablet Take 300 mg by mouth 3 (three) times daily.        . ropinirole (REQUIP) 5 MG tablet Take 1 tablet (5 mg total) by mouth at bedtime.  30 tablet  3  . vitamin D, CHOLECALCIFEROL, 400 UNITS tablet Take two by mouth daily  6 tablet  6  . zolpidem (AMBIEN) 10 MG tablet Take 10 mg by mouth at bedtime as needed. For sleep        Review of Systems:  As above. No chest pain. No shortness of breath. No tachypnea. No fever.  Physical Examination: Filed Vitals:   02/27/12 1127  BP: 122/80  Pulse: 92  Temp: 97.6 F (36.4 C)   Filed Vitals:   02/27/12 1127  Weight: 201 lb (91.173 kg)   There is no height on file to calculate BMI. Ideal Body Weight:     GEN: WDWN, NAD, Non-toxic, Alert & Oriented x 3 HEENT: Atraumatic, Normocephalic.  Ears and Nose: No external deformity. EXTR: 1 + LE edema, R LE. Left lower extremity has no edema. Homans sign is positive on the right. If he is pain with compression throughout the right lower extremity with no focal change.  Skin: There is some warmth in the distal dorsal aspect of the foot and the lateral aspect of the foot, there is also some swelling. NEURO: Normal gait.  PSYCH: Normally interactive. Conversant. Not depressed or anxious appearing.  Calm demeanor.    Assessment and Plan:  1. Leg pain  cephALEXin (KEFLEX) 500 MG capsule, Lower Extremity Venous Reflux Right  2. Leg swelling  cephALEXin (KEFLEX) 500 MG capsule, Lower Extremity Venous Reflux Right   Probable cellulitis versus potential DVT. Obtain an ultrasound of the right lower extremity to evaluate for venous thrombosis. Leg pain, swelling, warmth.  Start Keflex for now.  Cc: Dr. Deborra Medina  Orders Today:  Orders Placed This Encounter  Procedures  . Lower Extremity Venous Reflux Right     Standing Status: Future     Number of Occurrences:      Standing Expiration Date: 02/26/2013    Order Specific Question:  Laterality    Answer:  Right    Order Specific Question:  Where should this test be performed:    Answer:  [place]    Updated Medication List: (Includes new medications, updates to list, dose adjustments) Outpatient Encounter Prescriptions as of 02/27/2012  Medication Sig Dispense Refill  . ABILIFY 30 MG tablet TAKE 1 TABLET BY MOUTH EVERY MORNING  30 tablet  0  . celecoxib (CELEBREX) 200 MG capsule Take 200 mg by mouth daily.      . clonazePAM (KLONOPIN) 0.5 MG tablet Take 1 tablet (0.5 mg total) by mouth 2 (two) times daily as needed.  60 tablet  0  . diazepam (VALIUM) 5 MG tablet Take one by mouth twice a day as needed for muscle spasms.  60 tablet  0  . docusate sodium (COLACE) 100 MG capsule TAKE 1 CAPSULE BY MOUTH 2 TIMES DAILY AS NEEDED FOR CONSTIPATION  60 capsule  3  . estradiol (VIVELLE-DOT) 0.1 MG/24HR Place 1 patch (0.1 mg total) onto the skin 2 (two) times a week.  24 patch  12  . furosemide (LASIX) 20 MG tablet Take 1 tablet (20 mg total) by mouth daily.  30 tablet  3  . lamoTRIgine (LAMICTAL) 100  MG tablet Take 3 tablets by mouth once daily      . lidocaine (LIDODERM) 5 % Place 1 patch onto the skin daily. Remove & Discard patch within 12 hours or as directed by MD      . metFORMIN (GLUCOPHAGE-XR) 500 MG 24 hr tablet TAKE 1 TABLET (500 MG TOTAL) BY MOUTH DAILY WITH BREAKFAST.  30 tablet  6  . mirtazapine (REMERON) 15 MG tablet Take 15 mg by mouth at bedtime.      Marland Kitchen nystatin (MYCOSTATIN/NYSTOP) 100000 UNIT/GM POWD APPLY TOPICALLY 2 TIMES DAILY.AS DIRECTED  15 g  0  . omeprazole (PRILOSEC) 40 MG capsule Take 1 capsule (40 mg total) by mouth daily.  90 capsule  3  . oxyCODONE-acetaminophen (PERCOCET/ROXICET) 5-325 MG per tablet as needed. Take one by mouth at bedtime      . potassium chloride (K-DUR) 10 MEQ tablet Take one tablet daily  only when you are taking your lasix  30 tablet  0  . promethazine (PHENERGAN) 25 MG tablet TAKE ONE-HALF OR ONE TABLET BY MOUTH EVERY 8 HOURS AS NEEDED FOR NAUSEA  30 tablet  0  . QUEtiapine (SEROQUEL) 300 MG tablet Take 300 mg by mouth 3 (three) times daily.        . ropinirole (REQUIP) 5 MG tablet Take 1 tablet (5 mg total) by mouth at bedtime.  30 tablet  3  . vitamin D, CHOLECALCIFEROL, 400 UNITS tablet Take two by mouth daily  6 tablet  6  . zolpidem (AMBIEN) 10 MG tablet Take 10 mg by mouth at bedtime as needed. For sleep      . cephALEXin (KEFLEX) 500 MG capsule Take 2 capsules (1,000 mg total) by mouth 2 (two) times daily.  40 capsule  0    Medications Discontinued: There are no discontinued medications.   Owens Loffler, MD

## 2012-02-27 NOTE — Telephone Encounter (Signed)
Advised patient, she asks if you want her to take lasix that she has.

## 2012-02-27 NOTE — Patient Instructions (Addendum)
REFERRAL: GO THE THE FRONT ROOM AT THE ENTRANCE OF OUR CLINIC, NEAR CHECK IN. ASK FOR Stephanie [name]. SHE WILL HELP YOU SET UP YOUR REFERRAL. DATE: TIME:  

## 2012-02-27 NOTE — Telephone Encounter (Signed)
Right foot and ankle swollen with red rash on foot and lower leg; pt scheduled to see Dr Lorelei Pont today at 11:30 am. No chest pain or SOB. Pt to call back if condition changes or worsens.

## 2012-02-27 NOTE — Telephone Encounter (Signed)
Prop foot up - I am worried she has a skin infection like we talked about in the office. She should finish her antibiotics that I gave her.

## 2012-02-27 NOTE — Telephone Encounter (Signed)
Advised patient that doppler was negative.  She asks what to do about the swelling.  Please advise.

## 2012-02-27 NOTE — Telephone Encounter (Signed)
Advised patient of this earlier today.

## 2012-02-27 NOTE — Telephone Encounter (Signed)
Noted! Thank you

## 2012-02-27 NOTE — Telephone Encounter (Signed)
Advised patient as instructed.

## 2012-02-28 ENCOUNTER — Other Ambulatory Visit: Payer: Self-pay

## 2012-02-28 MED ORDER — CELECOXIB 200 MG PO CAPS: 200.0000 mg | ORAL_CAPSULE | Freq: Every day | ORAL | Status: DC

## 2012-02-28 NOTE — Telephone Encounter (Signed)
Pt request refill Celebrex to [facility]. Dr Irish Elders did not refill Celebrex 02/27/12.Please advise.

## 2012-02-28 NOTE — Telephone Encounter (Signed)
Advised patient, she will try to be careful with it, will try to only take one every other day.

## 2012-02-28 NOTE — Telephone Encounter (Signed)
I would not take celebrex if she can avoid it.  It is still metabolized extensively in the liver and she does have cirrhosis.

## 2012-02-28 NOTE — Telephone Encounter (Signed)
Pt left v/m requesting call back.

## 2012-02-28 NOTE — Telephone Encounter (Signed)
Left message asking pt to call back.

## 2012-02-29 ENCOUNTER — Other Ambulatory Visit: Payer: Self-pay | Admitting: Family Medicine

## 2012-03-01 ENCOUNTER — Other Ambulatory Visit: Payer: Self-pay

## 2012-03-01 NOTE — Telephone Encounter (Signed)
Pt said diazepam not at pharmacy. Spoke with Ronnie at [facility] diazepam is ready for pick up. Pt asked what infection was antibiotic given for. Advised per office note probable cellulitis.

## 2012-03-01 NOTE — Telephone Encounter (Signed)
Diazepam called to [facility].

## 2012-03-02 NOTE — Telephone Encounter (Signed)
Advised patient

## 2012-03-02 NOTE — Telephone Encounter (Signed)
The pt called the triage line hoping to find out if "an infection in her leg is contagious"   Thanks!

## 2012-03-02 NOTE — Telephone Encounter (Signed)
no

## 2012-03-07 ENCOUNTER — Other Ambulatory Visit: Payer: Self-pay | Admitting: Gastroenterology

## 2012-03-07 ENCOUNTER — Telehealth: Payer: Self-pay | Admitting: Gastroenterology

## 2012-03-07 ENCOUNTER — Other Ambulatory Visit (INDEPENDENT_AMBULATORY_CARE_PROVIDER_SITE_OTHER): Payer: Medicare Other

## 2012-03-07 ENCOUNTER — Encounter: Payer: Self-pay | Admitting: Family Medicine

## 2012-03-07 ENCOUNTER — Ambulatory Visit (INDEPENDENT_AMBULATORY_CARE_PROVIDER_SITE_OTHER): Payer: Medicare Other | Admitting: Family Medicine

## 2012-03-07 VITALS — BP 110/70 | HR 76 | Temp 97.4°F | Wt 207.0 lb

## 2012-03-07 DIAGNOSIS — M545 Low back pain: Secondary | ICD-10-CM | POA: Diagnosis not present

## 2012-03-07 DIAGNOSIS — K7460 Unspecified cirrhosis of liver: Secondary | ICD-10-CM | POA: Diagnosis not present

## 2012-03-07 DIAGNOSIS — E559 Vitamin D deficiency, unspecified: Secondary | ICD-10-CM | POA: Diagnosis not present

## 2012-03-07 NOTE — Patient Instructions (Addendum)
Please stop taking Percocet- I don't want taking more Tylenol than you need.     Cirrhosis Cirrhosis is a condition of scarring of the liver which is caused when the liver has tried repairing itself following damage. This damage may come from a previous infection such as one of the forms of hepatitis (usually hepatitis C), or the damage may come from being injured by toxins. The main toxin that causes this damage is alcohol. The scarring of the liver from use of alcohol is irreversible. That means the liver cannot return to normal even though alcohol is not used any more. The main danger of hepatitis C infection is that it may cause long-lasting (chronic) liver disease, and this also may lead to cirrhosis. This complication is progressive and irreversible. CAUSES  Prior to available blood tests, hepatitis C could be contracted by blood transfusions. Since testing of blood has improved, this is now unlikely. This infection can also be contracted through intravenous drug use and the sharing of needles. It can also be contracted through sexual relationships. The injury caused by alcohol comes from too much use. It is not a few drinks that poison the liver, but years of misuse. Usually there will be some signs and symptoms early with scarring of the liver that suggest the development of better habits. Alcohol should never be used while using acetaminophen. A small dose of both taken together may cause irreversible damage to the liver. HOME CARE INSTRUCTIONS  There is no specific treatment for cirrhosis. However, there are things you can do to avoid making the condition worse.  Rest as needed.  Eat a well-balanced diet. Your caregiver can help you with suggestions.  Vitamin supplements including vitamins A, K, D, and thiamine can help.  A low-salt diet, water restriction, or diuretic medicine may be needed to reduce fluid retention.  Avoid alcohol. This can be extremely toxic if combined with  acetaminophen.  Avoid drugs which are toxic to the liver. Some of these include isoniazid, methyldopa, acetaminophen, anabolic steroids (muscle-building drugs), erythromycin, and oral contraceptives (birth control pills). Check with your caregiver to make sure medicines you are presently taking will not be harmful.  Periodic blood tests may be required. Follow your caregiver's advice regarding the timing of these.  Milk thistle is an herbal remedy which does protect the liver against toxins. However, it will not help once the liver has been scarred. SEEK MEDICAL CARE IF:  You have increasing fatigue or weakness.  You develop swelling of the hands, feet, legs, or face.  You vomit bright red blood, or a coffee ground appearing material.  You have blood in your stools, or the stools turn black and tarry.  You have a fever.  You develop loss of appetite, or have nausea and vomiting.  You develop jaundice.  You develop easy bruising or bleeding.  You have worsening of any of the problems you are concerned about. Document Released: 05/23/2005 Document Revised: 08/15/2011 Document Reviewed: 01/09/2008 [facility] Patient Information 2013 [place].

## 2012-03-07 NOTE — Telephone Encounter (Signed)
Dr. Hulen Shouts office called and is asking when the pt is due for a liver biopsy. Dr. Deatra Ina please advise.

## 2012-03-07 NOTE — Telephone Encounter (Signed)
Patient does not have hepatitis B as determined by hepatitis serologies. Cirrhotic changes are very likely due to [place]. Please inform Dr. Orlene Erm office about this. Patient should have a followup visit. Prior to visit please get blood work for iron, TIBC, ferritin, full and intact hips and, and ceruloplasmin level, AMA and ANA

## 2012-03-07 NOTE — Telephone Encounter (Signed)
Pt aware of appt date and time. Knows to come for labs prior to MD visit.

## 2012-03-07 NOTE — Progress Notes (Signed)
Subjective:    Patient ID: Stephanie [name], female    DOB: [birth year], 61 y.o.   MRN: [id number]  HPI  61 yo with complicated medical history here for follow up.  Recently diagnosed with cirrhosis (see CT scan from 01/24/12).  Referred to GI-  Notes and studies reviewed.  Saw Dr. Deatra Ina who felt  changes are more likely related to [facility] than to chronic viral infection.   Does have remote h/o Hep B.    EGD- neg, no varices. Per pt, has follow up with Dr. Deatra Ina in 3 months.   Started lasix earlier this month for edema. Cr increased to 1.0 (still normal) but GFR decreased to 59.14. Lab Results  Component Value Date   CREATININE 0.8 02/23/2012  Advised her to STOP the lasix and potassium.  Here for follow up. Swelling is about the same.  She wants to know what next step will be.  Chronic low back pain-She has had multiple back surgeries- followed by Dr. Rolena Infante and Dr. Nelva Bush. Dr. Rolena Infante has been managing her pain with narcotics. No radiculopathy but she feels pain is getting worse.  I referred her to pain clinic last month.  She does not yet have an appointment.    Patient Active Problem List  Diagnosis  . HEPATITIS B, CHRONIC  . Benign Neoplasm of Colon  . UNSPECIFIED VITAMIN D DEFICIENCY  . HYPERLIPIDEMIA  . TOBACCO ABUSE  . OBSTRUCTIVE SLEEP APNEA  . RESTLESS LEGS SYNDROME  . DISORDER, NERVOUS SYSTEM NOS  . RHEUMATIC FEVER  . HYPERTENSION  . RHINITIS  . COPD  . SMALL BOWEL OBSTRUCTION  . OTHER CONSTIPATION  . CELLULITIS AND ABSCESS OF OTHER SPECIFIED SITE  . FOLLICULITIS  . SKIN LESION  . HIP PAIN  . BACK PAIN, LUMBAR, CHRONIC  . SCIATICA, LEFT  . INSOMNIA  . EDEMA, LOCALIZED  . NAUSEA ALONE  . INCONTINENCE  . ABDOMINAL PAIN  . PELVIC PAIN, ACUTE  . MAMMOGRAM, ABNORMAL, RIGHT  . UNS ADVRS EFF OTH RX MEDICINAL&BIOLOGICAL SBSTNC  . BIPOLAR AFFECTIVE DISORDER, HX OF  . PERSONAL HISTORY OF COLONIC POLYPS  . DYSURIA  . INTERTRIGO, CANDIDAL  . RIB PAIN,  RIGHT SIDED  . Routine general medical examination at a health care facility  . Hyperglycemia  . Localized swelling, mass, or lump of lower extremity  . Preop examination  . Aortic dissection  . Anxiety  . Fatigue  . Leg edema  . Cirrhosis of liver  . Acute renal insufficiency   Past Medical History  Diagnosis Date  . Adenomatous colon polyp   . Insomnia   . RLS (restless legs syndrome)   . Colon polyp   . Incontinence   . Hepatitis B, chronic   . H/O: rheumatic fever   . Hyperlipidemia   . Unspecified disorders of nervous system   . Bipolar affective disorder     h/o  . H/O: CVA (cardiovascular accident)     TIA  . Fatty liver 04/09/08    found in abd CT  . Shortness of breath   . Obstructive sleep apnea     not using CPAP, last sleep study 2.5 years ago, per pt doctor is aware  . Blood transfusion 2000  . GERD (gastroesophageal reflux disease)   . Arthritis   . Aortic dissection     Type 1  . COPD (chronic obstructive pulmonary disease)   . Anxiety   . Depression   . Heart murmur     as a  child  . Stroke     TIA- 2002  . Ulcer    Past Surgical History  Procedure Date  . Back surgery     x 45  . Cholecystectomy   . Rotator cuff repair     left  . Abdominal hysterectomy     total  . Lumbar fusion 10/09  . Hemorroidectomy     and colon polyp removed  . Abdominal exploration surgery   . Posterior cervical fusion/foraminotomy   . Eye surgery     bilat  . Lumbar wound debridement 05/09/2011    Procedure: LUMBAR WOUND DEBRIDEMENT;  Surgeon: Dahlia Bailiff;  Location: [place];  Service: Orthopedics;  Laterality: N/A;  IRRIGATION AND DEBRIDEMENT SPINAL WOUND  . Axillary artery cannulation via 8-mm hemashield graft, median sternotomy, extracorporeal circulation with deep hypothermic circulatory arrest, repair of  aortic dessection 01/23/2009    Dr Arlyce Dice  . Cataract     both eyes  . Colonoscopy    History  Substance Use Topics  . Smoking status: Current  Every Day Smoker -- 0.5 packs/day  . Smokeless tobacco: Never Used   Comment: Down to 1/2 a pack   . Alcohol Use: No   Family History  Problem Relation Age of Onset  . Colon cancer Neg Hx   . Esophageal cancer Neg Hx   . Rectal cancer Neg Hx   . Breast cancer Mother   . Lung cancer Mother   . Stomach cancer Father   . Diabetes      4 aunts, and 1 uncle   No Known Allergies Current Outpatient Prescriptions on File Prior to Visit  Medication Sig Dispense Refill  . ABILIFY 30 MG tablet TAKE 1 TABLET BY MOUTH EVERY MORNING  30 tablet  0  . celecoxib (CELEBREX) 200 MG capsule Take 1 capsule (200 mg total) by mouth daily.  30 capsule  0  . cephALEXin (KEFLEX) 500 MG capsule Take 2 capsules (1,000 mg total) by mouth 2 (two) times daily.  40 capsule  0  . clonazePAM (KLONOPIN) 0.5 MG tablet Take 1 tablet (0.5 mg total) by mouth 2 (two) times daily as needed.  60 tablet  0  . diazepam (VALIUM) 5 MG tablet TAKE 1 TABLET BY MOUTH TWICE A DAY AS NEEDED FOR MUSCLE SPASMS  60 tablet  0  . docusate sodium (COLACE) 100 MG capsule TAKE 1 CAPSULE BY MOUTH 2 TIMES DAILY AS NEEDED FOR CONSTIPATION  60 capsule  3  . estradiol (VIVELLE-DOT) 0.1 MG/24HR Place 1 patch (0.1 mg total) onto the skin 2 (two) times a week.  24 patch  12  . lamoTRIgine (LAMICTAL) 100 MG tablet Take 3 tablets by mouth once daily      . lidocaine (LIDODERM) 5 % Place 1 patch onto the skin daily. Remove & Discard patch within 12 hours or as directed by MD      . metFORMIN (GLUCOPHAGE-XR) 500 MG 24 hr tablet TAKE 1 TABLET (500 MG TOTAL) BY MOUTH DAILY WITH BREAKFAST.  30 tablet  6  . mirtazapine (REMERON) 15 MG tablet Take 15 mg by mouth at bedtime.      Marland Kitchen nystatin (MYCOSTATIN/NYSTOP) 100000 UNIT/GM POWD APPLY TOPICALLY 2 TIMES DAILY.AS DIRECTED  15 g  0  . omeprazole (PRILOSEC) 40 MG capsule Take 1 capsule (40 mg total) by mouth daily.  90 capsule  3  . oxyCODONE-acetaminophen (PERCOCET/ROXICET) 5-325 MG per tablet as needed. Take  one by mouth at bedtime      .  potassium chloride (K-DUR) 10 MEQ tablet Take one tablet daily only when you are taking your lasix  30 tablet  0  . promethazine (PHENERGAN) 25 MG tablet TAKE ONE-HALF OR ONE TABLET BY MOUTH EVERY 8 HOURS AS NEEDED FOR NAUSEA  30 tablet  0  . QUEtiapine (SEROQUEL) 300 MG tablet Take 300 mg by mouth 3 (three) times daily.        Marland Kitchen rOPINIRole (REQUIP) 3 MG tablet TAKE 1 TABLET BY MOUTH AT BEDTIME  30 tablet  1  . ropinirole (REQUIP) 5 MG tablet Take 1 tablet (5 mg total) by mouth at bedtime.  30 tablet  3  . vitamin D, CHOLECALCIFEROL, 400 UNITS tablet Take two by mouth daily  6 tablet  6  . zolpidem (AMBIEN) 10 MG tablet Take 10 mg by mouth at bedtime as needed. For sleep       The PMH, PSH, Social History, Family History, Medications, and allergies have been reviewed in [facility], and have been updated if relevant.   Review of Systems See HPI  No nausea, vomiting or diarrhea. No CP or SOB    Objective:   Physical Exam BP 110/70  Pulse 76  Temp 97.4 F (36.3 C)  Wt 207 lb (93.895 kg)  Wt Readings from Last 3 Encounters:  03/07/12 207 lb (93.895 kg)  02/27/12 201 lb (91.173 kg)  02/23/12 207 lb (93.895 kg)      Gen:  Alert, pleasant, NAD, no jaundice but does have some facial edma HEENT: neck supple, no nodules, non tender Abd:  Soft but diffuse tender, specifically over epigastric area, some guarding, no rebound, no ascites CVS:  RRR MSK:  Decreased ROM of back, pain with twisting, unable to flex or extend without significant pain, SLR neg. Ext:  Trace edema bilaterally    Assessment & Plan:   1. Cirrhosis of liver  New diagnosis- likely due to [place], EGD neg. ?pos hep A  ?possible liver bx.  Will contact GI to find out if this is suggested.   2. Leg edema  Stable.

## 2012-03-07 NOTE — Telephone Encounter (Signed)
Sorry, it should read alpha 1 antitrypsin level

## 2012-03-07 NOTE — Telephone Encounter (Signed)
Dr. Deatra Ina please clarify lab order below where it states "full and intact hips", please advise.

## 2012-03-08 LAB — VITAMIN D 25 HYDROXY (VIT D DEFICIENCY, FRACTURES): Vit D, 25-Hydroxy: 24 ng/mL — ABNORMAL LOW (ref 30–89)

## 2012-03-09 ENCOUNTER — Telehealth: Payer: Self-pay

## 2012-03-09 ENCOUNTER — Other Ambulatory Visit: Payer: Self-pay | Admitting: *Deleted

## 2012-03-09 MED ORDER — VITAMIN D (ERGOCALCIFEROL) 1.25 MG (50000 UNIT) PO CAPS: ORAL_CAPSULE | ORAL | Status: DC

## 2012-03-09 NOTE — Telephone Encounter (Signed)
Opened in error

## 2012-03-09 NOTE — Telephone Encounter (Signed)
Pt request status of Vit D rx to [facility]. Vivien Rota at [employer] said be ready 30-45 mins.pt notified while on phone.

## 2012-03-12 ENCOUNTER — Telehealth: Payer: Self-pay

## 2012-03-12 DIAGNOSIS — L600 Ingrowing nail: Secondary | ICD-10-CM | POA: Diagnosis not present

## 2012-03-12 DIAGNOSIS — M79609 Pain in unspecified limb: Secondary | ICD-10-CM | POA: Diagnosis not present

## 2012-03-12 NOTE — Telephone Encounter (Signed)
Pt left v/m requesting med to take place of Celebrex. Left v/m for more info and pharmacy.

## 2012-03-13 MED ORDER — TRAMADOL HCL 50 MG PO TABS: 50.0000 mg | ORAL_TABLET | Freq: Two times a day (BID) | ORAL | Status: DC | PRN

## 2012-03-13 NOTE — Telephone Encounter (Signed)
Advised patient, she said she will throw celebrex away.

## 2012-03-13 NOTE — Telephone Encounter (Signed)
Pt states she wants a med to replace celebrex. She took 3 doses then was advised by Dr Deborra Medina to d/c it. [facility]ses [facility].

## 2012-03-13 NOTE — Telephone Encounter (Signed)
Pt called back, she says she hasn't tried tramadol.

## 2012-03-13 NOTE — Telephone Encounter (Signed)
We would have to be cautious with all pain medications. Has she tried tramadol?

## 2012-03-13 NOTE — Telephone Encounter (Signed)
Rx for tramadol sent to her pharmacy. Celebrex d/c'd- please make sure she is not taking them together.

## 2012-03-14 ENCOUNTER — Ambulatory Visit: Payer: Medicare Other | Admitting: Family Medicine

## 2012-03-19 ENCOUNTER — Ambulatory Visit: Payer: Medicare Other

## 2012-03-19 ENCOUNTER — Other Ambulatory Visit: Payer: Self-pay | Admitting: Family Medicine

## 2012-03-19 DIAGNOSIS — K7460 Unspecified cirrhosis of liver: Secondary | ICD-10-CM

## 2012-03-19 LAB — IBC PANEL
Iron: 55 ug/dL (ref 42–145)
Saturation Ratios: 16.6 % — ABNORMAL LOW (ref 20.0–50.0)

## 2012-03-20 LAB — ALPHA-1-ANTITRYPSIN: A-1 Antitrypsin, Ser: 210 mg/dL — ABNORMAL HIGH (ref 90–200)

## 2012-03-20 LAB — ANA: Anti Nuclear Antibody(ANA): POSITIVE — AB

## 2012-03-26 ENCOUNTER — Encounter: Payer: Self-pay | Admitting: Gastroenterology

## 2012-03-26 ENCOUNTER — Ambulatory Visit (INDEPENDENT_AMBULATORY_CARE_PROVIDER_SITE_OTHER): Payer: Medicare Other | Admitting: Gastroenterology

## 2012-03-26 ENCOUNTER — Ambulatory Visit (HOSPITAL_COMMUNITY)
Admission: RE | Admit: 2012-03-26 | Discharge: 2012-03-26 | Disposition: A | Discharge status: Home / Self Care | Payer: Medicare Other | Source: Ambulatory Visit | Attending: Gastroenterology | Admitting: Gastroenterology

## 2012-03-26 VITALS — BP 120/68 | HR 100 | Ht 62.0 in | Wt 209.1 lb

## 2012-03-26 DIAGNOSIS — Z9089 Acquired absence of other organs: Secondary | ICD-10-CM | POA: Diagnosis not present

## 2012-03-26 DIAGNOSIS — K7460 Unspecified cirrhosis of liver: Secondary | ICD-10-CM

## 2012-03-26 DIAGNOSIS — K5909 Other constipation: Secondary | ICD-10-CM

## 2012-03-26 DIAGNOSIS — B181 Chronic viral hepatitis B without delta-agent: Secondary | ICD-10-CM | POA: Diagnosis not present

## 2012-03-26 NOTE — Patient Instructions (Addendum)
You have been given a separate informational sheet regarding your tobacco use, the importance of quitting and local resources to help you quit You have been scheduled for an abdominal ultrasound on  03/26/2012   3pm at [facility]. You will need to arrive 15 minutes before your appointment Nothing to eat or drink 6 hours prior to your test

## 2012-03-26 NOTE — Addendum Note (Signed)
Addended by: Oda Kilts on: 03/26/2012 10:31 AM   Modules accepted: Orders

## 2012-03-26 NOTE — Assessment & Plan Note (Signed)
Serologies are negative for chronic hepatitis B but positive for ANA suggesting autoimmune hepatitis. Increased abdominal girth and weight gain also raised the question of ascites although I can't quite appreciated on exam.  Recommendations #1 ultrasound to rule out ascites #2 to consider percutaneous liver biopsy if ascites is not present

## 2012-03-26 NOTE — Progress Notes (Signed)
History of Present Illness:  The patient has returned for followup of cirrhosis. Serologies are pertinent for negative hepatitis B surface antigen, E. antibody and core antibody and surface antibody. Her ANA was positive at 1-320. Her main complaint is weight gain. She has gained 24 pounds since September, 2013. She notes increasing abdominal girth.    Review of Systems: Pertinent positive and negative review of systems were noted in the above HPI section. All other review of systems were otherwise negative.    Current Medications, Allergies, Past Medical History, Past Surgical History, Family History and Social History were reviewed in [facility] record  Vital signs were reviewed in today's medical record. Physical Exam: General: Well developed , well nourished, no acute distress On abdominal exam abdomen is prominent, protuberant with  prominent venous vasculature. There is no shifting dullness or fluid wave. Liver and spleen are not palpable.

## 2012-03-27 ENCOUNTER — Other Ambulatory Visit: Payer: Self-pay | Admitting: Family Medicine

## 2012-03-27 NOTE — Telephone Encounter (Signed)
Electronic refill request

## 2012-03-28 ENCOUNTER — Telehealth: Payer: Self-pay | Admitting: Gastroenterology

## 2012-03-28 NOTE — Telephone Encounter (Signed)
Let pt know that results have not be reviewed by Dr. Deatra Ina yet, will call pt when results received.

## 2012-03-28 NOTE — Telephone Encounter (Signed)
rx called into pharmacy

## 2012-03-29 ENCOUNTER — Other Ambulatory Visit: Payer: Self-pay | Admitting: Gastroenterology

## 2012-03-29 ENCOUNTER — Telehealth: Payer: Self-pay | Admitting: Gastroenterology

## 2012-03-29 DIAGNOSIS — K7460 Unspecified cirrhosis of liver: Secondary | ICD-10-CM

## 2012-03-29 NOTE — Telephone Encounter (Signed)
Pt scheduled for liver biopsy at [facility] 04/10/12. Pt to arrive at 8am, procedure at 9am. Pt to be NPO after midnight. Pt aware of appt date and time. Scheduled with Sharee Pimple #[id number]. Order placed for ultrasound to mark liver.

## 2012-03-29 NOTE — Telephone Encounter (Signed)
Ultrasound does not show ascites.  Let's go ahead and set her up for a liver biopsy.

## 2012-03-29 NOTE — Telephone Encounter (Signed)
Pt is calling requesting the results of her abdominal ultrasound. Dr. Deatra Ina please advise.

## 2012-03-30 ENCOUNTER — Telehealth: Payer: Self-pay | Admitting: Gastroenterology

## 2012-03-30 NOTE — Telephone Encounter (Signed)
Pt called about her NPO status and I instructed her to stop eating and drinking after midnight on 04/09/12. She wanted to know about her meds and I informed her to take only the critical meds and she asked about Seroquel and I instructed her to take that with only a little water; she stated understanding.

## 2012-04-02 ENCOUNTER — Ambulatory Visit (INDEPENDENT_AMBULATORY_CARE_PROVIDER_SITE_OTHER): Payer: Medicare Other | Admitting: Family Medicine

## 2012-04-02 ENCOUNTER — Ambulatory Visit (INDEPENDENT_AMBULATORY_CARE_PROVIDER_SITE_OTHER)
Admission: RE | Admit: 2012-04-02 | Discharge: 2012-04-02 | Disposition: A | Discharge status: Home / Self Care | Payer: Medicare Other | Source: Ambulatory Visit | Attending: Family Medicine | Admitting: Family Medicine

## 2012-04-02 ENCOUNTER — Encounter: Payer: Self-pay | Admitting: Family Medicine

## 2012-04-02 VITALS — BP 124/76 | HR 104 | Temp 97.8°F | Wt 208.2 lb

## 2012-04-02 DIAGNOSIS — R109 Unspecified abdominal pain: Secondary | ICD-10-CM

## 2012-04-02 DIAGNOSIS — R079 Chest pain, unspecified: Secondary | ICD-10-CM | POA: Diagnosis not present

## 2012-04-02 LAB — POCT URINALYSIS DIPSTICK
Glucose, UA: NEGATIVE
Spec Grav, UA: >=1.03
Urobilinogen, UA: 0.2

## 2012-04-02 MED ORDER — CYCLOBENZAPRINE HCL 10 MG PO TABS: 10.0000 mg | ORAL_TABLET | Freq: Two times a day (BID) | ORAL | Status: DC | PRN

## 2012-04-02 MED ORDER — BENZONATATE 100 MG PO CAPS: 100.0000 mg | ORAL_CAPSULE | Freq: Three times a day (TID) | ORAL | Status: DC | PRN

## 2012-04-02 NOTE — Patient Instructions (Addendum)
Chest xray looking good. Let's check blood work today - pass by [employer] on your way out. I wonder if you have rib strain - given very positional in nature. Return to see Dr. Deborra Medina for follow up in next few weeks if not better. Treat with muscle relaxant provided today - caution it can make you sleepy.  Use ice/heat to back as well. Try tessalon perls for cough - swallow, don't chew. Cut back on smoking.

## 2012-04-02 NOTE — Assessment & Plan Note (Addendum)
Unclear etiology: Check UA today - no evidence of infection or hematuria for kidney stone. Check CXR given h/o aortic dissection - overall seems stable. Check blood work today to eval kidneys, pancreas and for blood loss. Given point tenderness and positional tenderness of pain, ?rib strain.  Treat with ice/heat, and continue valium pt has at home.  Also provided with tessalon perls hopefully to decrease cough which is worsening pain - CXR clear for cough. If not improved with this, recommended she return for f/u with PCP, consider CT to further evaluate.  Pt agrees with plan.

## 2012-04-02 NOTE — Progress Notes (Signed)
Subjective:    Patient ID: Stephanie [name], female    DOB: [birth year], 61 y.o.   MRN: [id number]  HPI CC: L flank pain  2 month h/o L flank pain even tender to touch.  Trouble leaning over, ie making bed.  Mild nausea.  Hasn't tried anything for this.  sxs worsening.  Described as sharp stabbing pain that only comes on with bending over.  Occasionally pleuritic.  Denies inciting trauma/injury. Has TENS in left back for chronic back pain. S/p 5 surgeries to lower back.  No recent fevers/chills, dysuria, urgency, frequency, vomiting, hematuria, abd pain. + has been coughing more recently.  Dry cough.  1/2 ppd smoker, longterm.  H/o aortic resection s/p repair 2010 Arlyce Dice).  Due for f/u with [facility] H/o cirrhosis thought 2/2 NASH rather than chronic viral hepatitis, pending liver biopsy.  Past Medical History  Diagnosis Date  . Adenomatous colon polyp   . Insomnia   . RLS (restless legs syndrome)   . Colon polyp   . Incontinence   . Hepatitis B, chronic   . H/O: rheumatic fever   . Hyperlipidemia   . Unspecified disorders of nervous system   . Bipolar affective disorder     h/o  . H/O: CVA (cardiovascular accident)     TIA  . Fatty liver 04/09/08    found in abd CT  . Shortness of breath   . Obstructive sleep apnea     not using CPAP, last sleep study 2.5 years ago, per pt doctor is aware  . Blood transfusion 2000  . GERD (gastroesophageal reflux disease)   . Arthritis   . Aortic dissection     Type 1  . COPD (chronic obstructive pulmonary disease)   . Anxiety   . Depression   . Heart murmur     as a child  . Stroke     TIA- 2002  . Ulcer     Past Surgical History  Procedure Date  . Back surgery     x 45  . Cholecystectomy   . Rotator cuff repair     left  . Abdominal hysterectomy     total  . Lumbar fusion 10/09  . Hemorroidectomy     and colon polyp removed  . Abdominal exploration surgery   . Posterior cervical fusion/foraminotomy   . Eye surgery    bilat  . Lumbar wound debridement 05/09/2011    Procedure: LUMBAR WOUND DEBRIDEMENT;  Surgeon: Dahlia Bailiff;  Location: [place];  Service: Orthopedics;  Laterality: N/A;  IRRIGATION AND DEBRIDEMENT SPINAL WOUND  . Axillary artery cannulation via 8-mm hemashield graft, median sternotomy, extracorporeal circulation with deep hypothermic circulatory arrest, repair of  aortic dessection 01/23/2009    Dr Arlyce Dice  . Cataract     both eyes  . Colonoscopy     Review of Systems Per HPI    Objective:   Physical Exam  Nursing note and vitals reviewed. Constitutional: She appears well-developed and well-nourished. No distress.  HENT:  Head: Normocephalic and atraumatic.  Mouth/Throat: Oropharynx is clear and moist. No oropharyngeal exudate.  Eyes: Conjunctivae normal and EOM are normal. Pupils are equal, round, and reactive to light.  Neck: Normal range of motion. Neck supple.  Cardiovascular: Normal rate, regular rhythm, normal heart sounds and intact distal pulses.   No murmur heard. Pulmonary/Chest: Effort normal and breath sounds normal. No respiratory distress. She has no wheezes. She has no rales. She exhibits tenderness.       Tender  to palpation left posterior mid ribcage  Abdominal: Normal appearance and bowel sounds are normal. She exhibits no distension, no pulsatile liver, no abdominal bruit, no pulsatile midline mass and no mass. There is no hepatosplenomegaly. There is tenderness (moderate to deep palpation) in the epigastric area and left upper quadrant. There is guarding and CVA tenderness (mild). There is no rigidity, no rebound and negative Murphy's sign.       obese  Musculoskeletal: She exhibits no edema.  Lymphadenopathy:    She has no cervical adenopathy.  Skin: Skin is warm and dry. No rash noted.       Assessment & Plan:

## 2012-04-03 ENCOUNTER — Telehealth: Payer: Self-pay

## 2012-04-03 LAB — CBC WITH DIFFERENTIAL/PLATELET
Basophils Relative: 0.3 % (ref 0.0–3.0)
Eosinophils Relative: 1.3 % (ref 0.0–5.0)
Lymphocytes Relative: 19.5 % (ref 12.0–46.0)
MCV: 91.8 fl (ref 78.0–100.0)
Monocytes Relative: 7.7 % (ref 3.0–12.0)
Neutrophils Relative %: 71.2 % (ref 43.0–77.0)
RBC: 3.97 Mil/uL (ref 3.87–5.11)
WBC: 7.6 10*3/uL (ref 4.5–10.5)

## 2012-04-03 LAB — BASIC METABOLIC PANEL
BUN: 15 mg/dL (ref 6–23)
CO2: 29 mEq/L (ref 19–32)
Chloride: 103 mEq/L (ref 96–112)
Glucose, Bld: 82 mg/dL (ref 70–99)
Potassium: 4.4 mEq/L (ref 3.5–5.1)

## 2012-04-03 NOTE — Telephone Encounter (Signed)
Pt request call back with lab results when available.

## 2012-04-03 NOTE — Telephone Encounter (Signed)
Patient notified

## 2012-04-08 ENCOUNTER — Other Ambulatory Visit: Payer: Self-pay | Admitting: Family Medicine

## 2012-04-10 ENCOUNTER — Ambulatory Visit (HOSPITAL_COMMUNITY)
Admission: RE | Admit: 2012-04-10 | Discharge: 2012-04-10 | Disposition: A | Discharge status: Home / Self Care | Payer: Medicare Other | Source: Ambulatory Visit | Attending: Gastroenterology | Admitting: Gastroenterology

## 2012-04-10 ENCOUNTER — Encounter (HOSPITAL_COMMUNITY)
Admission: RE | Disposition: A | Discharge status: Home / Self Care | Payer: Self-pay | Source: Ambulatory Visit | Attending: Gastroenterology

## 2012-04-10 ENCOUNTER — Encounter (HOSPITAL_COMMUNITY): Payer: Self-pay | Admitting: *Deleted

## 2012-04-10 DIAGNOSIS — K7460 Unspecified cirrhosis of liver: Secondary | ICD-10-CM | POA: Diagnosis not present

## 2012-04-10 DIAGNOSIS — K7689 Other specified diseases of liver: Secondary | ICD-10-CM | POA: Insufficient documentation

## 2012-04-10 DIAGNOSIS — K759 Inflammatory liver disease, unspecified: Secondary | ICD-10-CM | POA: Diagnosis not present

## 2012-04-10 DIAGNOSIS — Z9889 Other specified postprocedural states: Secondary | ICD-10-CM | POA: Diagnosis not present

## 2012-04-10 HISTORY — PX: LIVER BIOPSY: SHX301

## 2012-04-10 SURGERY — BIOPSY, LIVER
Anesthesia: Moderate Sedation | Wound class: Clean

## 2012-04-10 MED ORDER — FENTANYL CITRATE 0.05 MG/ML IJ SOLN
INTRAMUSCULAR | Status: AC
  Filled 2012-04-10: qty 2

## 2012-04-10 MED ORDER — ACETAMINOPHEN-CODEINE #3 300-30 MG PO TABS
ORAL_TABLET | ORAL | Status: AC
Start: 2012-04-10 — End: 2012-04-10
  Filled 2012-04-10: qty 2

## 2012-04-10 MED ORDER — ACETAMINOPHEN-CODEINE #3 300-30 MG PO TABS: 1.0000 | ORAL_TABLET | ORAL | Status: DC | PRN

## 2012-04-10 MED ORDER — ACETAMINOPHEN-CODEINE #3 300-30 MG PO TABS
2.0000 | ORAL_TABLET | Freq: Once | ORAL | Status: AC
Start: 2012-04-10 — End: 2012-04-10
  Administered 2012-04-10: 2 via ORAL

## 2012-04-10 MED ORDER — FENTANYL CITRATE 0.05 MG/ML IJ SOLN
50.0000 ug | Freq: Once | INTRAMUSCULAR | Status: AC
  Administered 2012-04-10: 50 ug via INTRAVENOUS

## 2012-04-10 MED ORDER — FENTANYL CITRATE 0.05 MG/ML IJ SOLN
INTRAMUSCULAR | Status: DC | PRN
  Administered 2012-04-10 (×3): 25 ug via INTRAVENOUS

## 2012-04-10 MED ORDER — MIDAZOLAM HCL 10 MG/2ML IJ SOLN
INTRAMUSCULAR | Status: AC
  Filled 2012-04-10: qty 2

## 2012-04-10 MED ORDER — MIDAZOLAM HCL 10 MG/2ML IJ SOLN
INTRAMUSCULAR | Status: DC | PRN
  Administered 2012-04-10 (×2): 2 mg via INTRAVENOUS

## 2012-04-10 MED ORDER — SODIUM CHLORIDE 0.9 % IV SOLN
INTRAVENOUS | Status: DC
  Administered 2012-04-10: 500 mL via INTRAVENOUS

## 2012-04-10 MED ORDER — LIDOCAINE HCL 1 % IJ SOLN
INTRAMUSCULAR | Status: AC
  Filled 2012-04-10: qty 20

## 2012-04-10 NOTE — H&P (View-Only) (Signed)
History of Present Illness:  The patient has returned for followup of cirrhosis. Serologies are pertinent for negative hepatitis B surface antigen, E. antibody and core antibody and surface antibody. Her ANA was positive at 1-320. Her main complaint is weight gain. She has gained 24 pounds since September, 2013. She notes increasing abdominal girth.    Review of Systems: Pertinent positive and negative review of systems were noted in the above HPI section. All other review of systems were otherwise negative.    Current Medications, Allergies, Past Medical History, Past Surgical History, Family History and Social History were reviewed in [facility] record  Vital signs were reviewed in today's medical record. Physical Exam: General: Well developed , well nourished, no acute distress On abdominal exam abdomen is prominent, protuberant with  prominent venous vasculature. There is no shifting dullness or fluid wave. Liver and spleen are not palpable.

## 2012-04-10 NOTE — Op Note (Signed)
[facility] [place], [id number]   OPERATIVE PROCEDURE REPORT  PATIENT: Stephanie, [name]  MR#: [id number] BIRTHDATE: 04/28/1951  GENDER Female PHYSICIAN:  Inda Castle, MD REFERRED BY: Kathryne Eriksson, M.D. PROCEDURE DATE:  04/10/2012 PROCEDURE:   Percutaneous Liver Biopsy ASA CLASS:      Class II INDICATIONS:  suspicion of cirrhosis.   elevated liver function tests greater than 6 months. MEDICATIONS: These medications were titrated to patient response per physician's verbal order, Versed 4 mg IV, and Fentanyl 75 mcg IV  DESCRIPTION OF PROCEDURE:  After the risks benefits and alternatives of the procedure were thoroughly explained, informed consent was obtained.  The patient was placed in the supine position with the right side of the abdomen exposed.  The area was sterilely prepped. Under Betadine prep and local     asesthesia, a percutaneous liver biopsy was performed using the left lateral intercostal approach. The Tru-cut biopsy needle   The specimen was submitted for histopathology  COMPLICATIONS: There were no complications. IMPRESSION s/p liver biopsy RECOMMENDATIONS: OV 2 weeks   _______________________________ eSigned:  Inda Castle, MD 04/10/2012 9:55 AM   CC:

## 2012-04-10 NOTE — Interval H&P Note (Signed)
History and Physical Interval Note:  04/10/2012 9:30 AM  [name]  has presented today for surgery, with the diagnosis of Cirrhosis of liver [571.5]  The various methods of treatment have been discussed with the patient and family. After consideration of risks, benefits and other options for treatment, the patient has consented to  Procedure(s) (LRB) with comments: LIVER BIOPSY (N/A) - ultrasound to mark order for abd limited/liver to be marked to be sent by [email or web address]  as a surgical intervention .  The patient's history has been reviewed, patient examined, no change in status, stable for surgery.  I have reviewed the patient's chart and labs.  Questions were answered to the patient's satisfaction.     The recent H&P (dated *03/26/12**) was reviewed, the patient was examined and there is no change in the patients condition since that H&P was completed.   Erskine Emery  04/10/2012, 9:30 AM   Erskine Emery

## 2012-04-11 ENCOUNTER — Encounter (HOSPITAL_COMMUNITY): Payer: Self-pay

## 2012-04-11 ENCOUNTER — Encounter (HOSPITAL_COMMUNITY): Payer: Self-pay | Admitting: Gastroenterology

## 2012-04-16 DIAGNOSIS — G894 Chronic pain syndrome: Secondary | ICD-10-CM | POA: Diagnosis not present

## 2012-04-16 DIAGNOSIS — Z79899 Other long term (current) drug therapy: Secondary | ICD-10-CM | POA: Diagnosis not present

## 2012-04-16 DIAGNOSIS — R52 Pain, unspecified: Secondary | ICD-10-CM | POA: Diagnosis not present

## 2012-04-16 DIAGNOSIS — M545 Low back pain: Secondary | ICD-10-CM | POA: Diagnosis not present

## 2012-04-17 ENCOUNTER — Telehealth: Payer: Self-pay

## 2012-04-17 ENCOUNTER — Other Ambulatory Visit: Payer: Self-pay | Admitting: Family Medicine

## 2012-04-17 NOTE — Telephone Encounter (Signed)
She should now take Vit D 1200 IU daily (this is OTC).  Make sure she is scheduled for follow up Vit D lab.

## 2012-04-17 NOTE — Telephone Encounter (Signed)
Advised patient, she has lab appt scheduled for 06/12/12.

## 2012-04-17 NOTE — Telephone Encounter (Signed)
Pt took last Vitamin D 50,000 unit this week; pt wants to know if she should continue taking 50,000 units weekly, if so will need refill or what should pt take for Vit D deficiency.Please advise. [facility].

## 2012-04-18 ENCOUNTER — Ambulatory Visit: Payer: Self-pay | Admitting: Pain Medicine

## 2012-04-18 ENCOUNTER — Other Ambulatory Visit: Payer: Self-pay | Admitting: Family Medicine

## 2012-04-18 DIAGNOSIS — M47817 Spondylosis without myelopathy or radiculopathy, lumbosacral region: Secondary | ICD-10-CM | POA: Diagnosis not present

## 2012-04-18 DIAGNOSIS — M25559 Pain in unspecified hip: Secondary | ICD-10-CM | POA: Diagnosis not present

## 2012-04-18 DIAGNOSIS — G8929 Other chronic pain: Secondary | ICD-10-CM | POA: Diagnosis not present

## 2012-04-18 DIAGNOSIS — M533 Sacrococcygeal disorders, not elsewhere classified: Secondary | ICD-10-CM | POA: Diagnosis not present

## 2012-04-18 DIAGNOSIS — M461 Sacroiliitis, not elsewhere classified: Secondary | ICD-10-CM | POA: Diagnosis not present

## 2012-04-18 DIAGNOSIS — M5137 Other intervertebral disc degeneration, lumbosacral region: Secondary | ICD-10-CM | POA: Diagnosis not present

## 2012-04-18 DIAGNOSIS — G894 Chronic pain syndrome: Secondary | ICD-10-CM | POA: Diagnosis not present

## 2012-04-18 DIAGNOSIS — B1920 Unspecified viral hepatitis C without hepatic coma: Secondary | ICD-10-CM | POA: Diagnosis not present

## 2012-04-18 DIAGNOSIS — Z9889 Other specified postprocedural states: Secondary | ICD-10-CM | POA: Diagnosis not present

## 2012-04-18 DIAGNOSIS — M169 Osteoarthritis of hip, unspecified: Secondary | ICD-10-CM | POA: Diagnosis not present

## 2012-04-18 DIAGNOSIS — M545 Low back pain: Secondary | ICD-10-CM | POA: Diagnosis not present

## 2012-04-18 LAB — HEPATIC FUNCTION PANEL A (ARMC)
Albumin: 3.5 g/dL (ref 3.4–5.0)
Alkaline Phosphatase: 158 U/L — ABNORMAL HIGH (ref 50–136)
Bilirubin,Total: 0.3 mg/dL (ref 0.2–1.0)
SGOT(AST): 28 U/L (ref 15–37)
SGPT (ALT): 27 U/L (ref 12–78)

## 2012-04-18 LAB — SEDIMENTATION RATE: Erythrocyte Sed Rate: 55 mm/hr — ABNORMAL HIGH (ref 0–30)

## 2012-04-23 ENCOUNTER — Telehealth: Payer: Self-pay | Admitting: *Deleted

## 2012-04-23 ENCOUNTER — Other Ambulatory Visit: Payer: Self-pay

## 2012-04-23 ENCOUNTER — Telehealth: Payer: Self-pay

## 2012-04-23 MED ORDER — LIDOCAINE 5 % EX PTCH: 1.0000 | MEDICATED_PATCH | CUTANEOUS | Status: DC

## 2012-04-23 NOTE — Telephone Encounter (Signed)
Patient is calling about lab and x-ray results she had last week. Not sure which doctor ordered these test.

## 2012-04-23 NOTE — Telephone Encounter (Signed)
Pt request refill Lidoderm patch to [facility]. Dr Nelva Bush had given lidoderm patch to pt but pt prefers to get refills from Dr Deborra Medina.Please advise. Pt request call back when refilled.

## 2012-04-23 NOTE — Telephone Encounter (Signed)
Prior Stephanie [name] is needed for lidocaine patch, form is on your desk.

## 2012-04-23 NOTE — Telephone Encounter (Signed)
Pt wanted to know why not refill lidocaine patch; explained required a PA.pt voiced understanding.

## 2012-04-23 NOTE — Telephone Encounter (Signed)
Called patient and advised that we had no records she had been seen here. She states she was seen by someone at [facility] and thought we were connected with them. Advised patient of [facility] number [phone]. She is going to call them and get her results.

## 2012-04-23 NOTE — Telephone Encounter (Signed)
Advised patient that script has been sent in.

## 2012-04-23 NOTE — Telephone Encounter (Signed)
Patient called to get xray results and other results.

## 2012-04-23 NOTE — Telephone Encounter (Signed)
Advised patient she would need to contact ordering physician for her labs and x-rays. She has not been seen by any of our physicians in the office. Pt is going to contact [facility] since she had her test done there.

## 2012-04-24 ENCOUNTER — Telehealth: Payer: Self-pay

## 2012-04-24 NOTE — Telephone Encounter (Signed)
Form faxed back to medicaid.

## 2012-04-24 NOTE — Telephone Encounter (Signed)
Advised patient that we are waiting for response from insurance company regarding lidoderm, and this may take a few days.  Pt understood.

## 2012-04-24 NOTE — Telephone Encounter (Signed)
Pt request call back when lidocaine patch is sent to pharmacy.

## 2012-04-24 NOTE — Telephone Encounter (Signed)
Pt called for update on PA. Patient notified as instructed by telephone waiting on approval or denial from insurance.

## 2012-04-25 ENCOUNTER — Encounter: Payer: Self-pay | Admitting: Gastroenterology

## 2012-04-25 ENCOUNTER — Telehealth: Payer: Self-pay

## 2012-04-25 ENCOUNTER — Ambulatory Visit (INDEPENDENT_AMBULATORY_CARE_PROVIDER_SITE_OTHER): Payer: Medicare Other | Admitting: Gastroenterology

## 2012-04-25 ENCOUNTER — Other Ambulatory Visit: Payer: Self-pay | Admitting: Family Medicine

## 2012-04-25 VITALS — BP 130/70 | HR 80 | Ht 61.75 in | Wt 209.0 lb

## 2012-04-25 DIAGNOSIS — K7460 Unspecified cirrhosis of liver: Secondary | ICD-10-CM

## 2012-04-25 NOTE — Patient Instructions (Addendum)
You have been given a separate informational sheet regarding your tobacco use, the importance of quitting and local resources to help you quit.  You must attend the weight loss seminar to schedule you have to call [phone] at the seminar they will set you up an appointment with [facility].  Please follow up with Dr. Deatra Ina as needed.

## 2012-04-25 NOTE — Progress Notes (Signed)
History of Present Illness:  Patient returned to discuss liver biopsy results.  She has no new complaints.     Review of Systems: Pertinent positive and negative review of systems were noted in the above HPI section. All other review of systems were otherwise negative.    Current Medications, Allergies, Past Medical History, Past Surgical History, Family History and Social History were reviewed in [facility] record  Vital signs were reviewed in today's medical record. Physical Exam: General: Well developed , well nourished, no acute distress

## 2012-04-25 NOTE — Assessment & Plan Note (Signed)
See comments above

## 2012-04-25 NOTE — Telephone Encounter (Signed)
Pt saw Dr Brynda Peon at [facility] pain clinic; pt said Dr Brynda Peon was going to fax office notes, labs and xrays to Dr Deborra Medina. Pt request Dr Deborra Medina to review and let pt know her opinion.Please advise.

## 2012-04-26 ENCOUNTER — Other Ambulatory Visit: Payer: Self-pay | Admitting: Family Medicine

## 2012-04-26 NOTE — Telephone Encounter (Signed)
Pt left v/m requesting status lidocaine patch refill. Pt request call back.

## 2012-04-27 NOTE — Telephone Encounter (Signed)
Form faxed

## 2012-04-27 NOTE — Telephone Encounter (Signed)
Form completed and on my desk.

## 2012-04-27 NOTE — Telephone Encounter (Signed)
I did but I sent them to be scanned prior to her calling and I"m waiting for them to appear under media.

## 2012-04-27 NOTE — Telephone Encounter (Signed)
Pt called back to see if Dr Deborra Medina received xrays and labs from pain clinic.Please advise.

## 2012-04-27 NOTE — Telephone Encounter (Signed)
Prior auth given for lidoderm, but only for one month.  Form is on your desk to check the box for length of therapy if you want her to continue this for a longer period of time.

## 2012-04-27 NOTE — Telephone Encounter (Signed)
Advised patient and told her we will call her when you have reviewed results.

## 2012-05-04 ENCOUNTER — Other Ambulatory Visit: Payer: Self-pay | Admitting: Family Medicine

## 2012-05-04 NOTE — Telephone Encounter (Signed)
Electronic refill requests.  Please advise.

## 2012-05-04 NOTE — Telephone Encounter (Signed)
Rx's phoned to pharmacy.

## 2012-05-07 NOTE — Telephone Encounter (Signed)
Pt calling to get lab and xray results from pain clinic.Please advise.

## 2012-05-08 ENCOUNTER — Telehealth: Payer: Self-pay | Admitting: *Deleted

## 2012-05-08 NOTE — Telephone Encounter (Signed)
Notes received, placed on Dr. Hulen Shouts desk.

## 2012-05-08 NOTE — Telephone Encounter (Signed)
Left message asking patient to call back

## 2012-05-08 NOTE — Telephone Encounter (Signed)
Pt left v/m requesting lab and xray results from pain clinic.

## 2012-05-08 NOTE — Telephone Encounter (Signed)
Margarita Grizzle can you find these under media? I can't find them.

## 2012-05-08 NOTE — Telephone Encounter (Signed)
Patient states she received a letter from her insurance company informing her that they will no longer cover vivelle dot and asking if you can change her to estradiol patch.  Patient agrees with this if you do.  Uses [facility].

## 2012-05-09 MED ORDER — ESTRADIOL 0.1 MG/24HR TD PTTW: 1.0000 | MEDICATED_PATCH | TRANSDERMAL | Status: DC

## 2012-05-09 NOTE — Telephone Encounter (Signed)
Rx for patch sent.

## 2012-05-13 ENCOUNTER — Other Ambulatory Visit: Payer: Self-pay | Admitting: Family Medicine

## 2012-05-24 ENCOUNTER — Other Ambulatory Visit: Payer: Self-pay | Admitting: Family Medicine

## 2012-05-25 ENCOUNTER — Other Ambulatory Visit: Payer: Self-pay | Admitting: Family Medicine

## 2012-05-28 ENCOUNTER — Other Ambulatory Visit: Payer: Self-pay | Admitting: Family Medicine

## 2012-06-04 ENCOUNTER — Other Ambulatory Visit: Payer: Self-pay | Admitting: Family Medicine

## 2012-06-07 ENCOUNTER — Other Ambulatory Visit: Payer: Self-pay | Admitting: Thoracic Surgery (Cardiothoracic Vascular Surgery)

## 2012-06-07 DIAGNOSIS — D381 Neoplasm of uncertain behavior of trachea, bronchus and lung: Secondary | ICD-10-CM

## 2012-06-08 ENCOUNTER — Other Ambulatory Visit: Payer: Self-pay | Admitting: Family Medicine

## 2012-06-08 NOTE — Telephone Encounter (Signed)
[facility]

## 2012-06-12 ENCOUNTER — Encounter: Payer: Self-pay | Admitting: Family Medicine

## 2012-06-12 ENCOUNTER — Ambulatory Visit (INDEPENDENT_AMBULATORY_CARE_PROVIDER_SITE_OTHER): Payer: Medicare Other | Admitting: Family Medicine

## 2012-06-12 ENCOUNTER — Other Ambulatory Visit (INDEPENDENT_AMBULATORY_CARE_PROVIDER_SITE_OTHER): Payer: Medicare Other

## 2012-06-12 VITALS — BP 110/74 | HR 97 | Temp 97.5°F | Wt 210.0 lb

## 2012-06-12 DIAGNOSIS — E559 Vitamin D deficiency, unspecified: Secondary | ICD-10-CM

## 2012-06-12 DIAGNOSIS — K7460 Unspecified cirrhosis of liver: Secondary | ICD-10-CM

## 2012-06-12 DIAGNOSIS — E550 Rickets, active: Secondary | ICD-10-CM

## 2012-06-12 NOTE — Progress Notes (Signed)
Subjective:    Patient ID: Stephanie [name], female    DOB: 07/16/1950, 62 y.o.   MRN: [id number]  HPI  62 yo with complicated medical history here for follow up.  She would like to discuss liver biopsy results.  CT scan in 01/2012 showed ? Cirhossis.    Referred to GI-  Liver biopsy shows steatohepatitis with early fibrosis.   Lab Results  Component Value Date   ALT 27 02/23/2012   AST 28 02/23/2012   ALKPHOS 126* 02/23/2012   BILITOT 0.5 02/23/2012      Patient Active Problem List  Diagnosis  . Benign Neoplasm of Colon  . UNSPECIFIED VITAMIN D DEFICIENCY  . HYPERLIPIDEMIA  . TOBACCO ABUSE  . OBSTRUCTIVE SLEEP APNEA  . RESTLESS LEGS SYNDROME  . DISORDER, NERVOUS SYSTEM NOS  . RHEUMATIC FEVER  . HYPERTENSION  . RHINITIS  . COPD  . SMALL BOWEL OBSTRUCTION  . OTHER CONSTIPATION  . CELLULITIS AND ABSCESS OF OTHER SPECIFIED SITE  . FOLLICULITIS  . SKIN LESION  . HIP PAIN  . BACK PAIN, LUMBAR, CHRONIC  . SCIATICA, LEFT  . INSOMNIA  . EDEMA, LOCALIZED  . NAUSEA ALONE  . INCONTINENCE  . ABDOMINAL PAIN  . PELVIC PAIN, ACUTE  . MAMMOGRAM, ABNORMAL, RIGHT  . UNS ADVRS EFF OTH RX MEDICINAL&BIOLOGICAL SBSTNC  . BIPOLAR AFFECTIVE DISORDER, HX OF  . PERSONAL HISTORY OF COLONIC POLYPS  . DYSURIA  . INTERTRIGO, CANDIDAL  . RIB PAIN, RIGHT SIDED  . Routine general medical examination at a health care facility  . Hyperglycemia  . Localized swelling, mass, or lump of lower extremity  . Preop examination  . Aortic dissection  . Anxiety  . Fatigue  . Leg edema  . Cirrhosis of liver  . Acute renal insufficiency  . Left flank pain   Past Medical History  Diagnosis Date  . Adenomatous colon polyp   . Insomnia   . RLS (restless legs syndrome)   . Colon polyp   . Incontinence   . Hepatitis B, chronic   . H/O: rheumatic fever   . Hyperlipidemia   . Unspecified disorders of nervous system   . Bipolar affective disorder     h/o  . H/O: CVA (cardiovascular accident)       TIA  . Fatty liver 04/09/08    found in abd CT  . Shortness of breath   . Obstructive sleep apnea     not using CPAP, last sleep study 2.5 years ago, per pt doctor is aware  . Blood transfusion 2000  . GERD (gastroesophageal reflux disease)   . Arthritis   . Aortic dissection     Type 1  . COPD (chronic obstructive pulmonary disease)   . Anxiety   . Depression   . Heart murmur     as a child  . Stroke     TIA- 2002  . Ulcer    Past Surgical History  Procedure Date  . Back surgery     x 45  . Cholecystectomy   . Rotator cuff repair     left  . Abdominal hysterectomy     total  . Lumbar fusion 10/09  . Hemorroidectomy     and colon polyp removed  . Abdominal exploration surgery   . Posterior cervical fusion/foraminotomy   . Eye surgery     bilat  . Lumbar wound debridement 05/09/2011    Procedure: LUMBAR WOUND DEBRIDEMENT;  Surgeon: Dahlia Bailiff;  Location: [facility] OR;  Service: Orthopedics;  Laterality: N/A;  IRRIGATION AND DEBRIDEMENT SPINAL WOUND  . Axillary artery cannulation via 8-mm hemashield graft, median sternotomy, extracorporeal circulation with deep hypothermic circulatory arrest, repair of  aortic dessection 01/23/2009    Dr Arlyce Dice  . Cataract     both eyes  . Colonoscopy   . Liver biopsy 04/10/2012    Procedure: LIVER BIOPSY;  Surgeon: Inda Castle, MD;  Location: [facility];  Service: Endoscopy;  Laterality: N/A;  ultrasound to mark order for abd limited/liver to be marked to be sent by [email or web address]    History  Substance Use Topics  . Smoking status: Current Every Day Smoker -- 0.5 packs/day    Types: Cigarettes  . Smokeless tobacco: Never Used     Comment: Down to 1/2 a pack   . Alcohol Use: No   Family History  Problem Relation Age of Onset  . Colon cancer Neg Hx   . Esophageal cancer Neg Hx   . Rectal cancer Neg Hx   . Breast cancer Mother   . Lung cancer Mother   . Stomach cancer Father   . Diabetes      4 aunts, and 1 uncle   No  Known Allergies Current Outpatient Prescriptions on File Prior to Visit  Medication Sig Dispense Refill  . ABILIFY 30 MG tablet TAKE 1 TABLET BY MOUTH EVERY MORNING  30 tablet  0  . clonazePAM (KLONOPIN) 0.5 MG tablet TAKE 1 TABLET BY MOUTH TWICE A DAY AS NEEDED FOR ANXIETY  60 tablet  0  . [facility] STOOL SOFTENER 100 MG capsule TAKE 1 CAPSULE BY MOUTH 2 TIMES DAILY AS NEEDED FOR CONSTIPATION  60 capsule  3  . diazepam (VALIUM) 5 MG tablet TAKE 1 TABLET BY MOUTH TWICE A DAY AS NEEDED FOR MUSCLE SPASMS  60 tablet  0  . estradiol (VIVELLE-DOT) 0.1 MG/24HR Place 1 patch (0.1 mg total) onto the skin 2 (two) times a week.  8 patch  12  . lamoTRIgine (LAMICTAL) 100 MG tablet Take 3 tablets by mouth once daily      . LIDODERM 5 % PLACE 1 PATCH ONTO THE SKIN DAILY. REMOVE & DISCARD PATCH WITHIN 12 HOURS OR AS DIRECTED BY MD  30 patch  0  . metFORMIN (GLUCOPHAGE-XR) 500 MG 24 hr tablet TAKE 1 TABLET (500 MG TOTAL) BY MOUTH DAILY WITH BREAKFAST.  30 tablet  6  . mirtazapine (REMERON) 15 MG tablet Take 15 mg by mouth at bedtime.      Marland Kitchen nystatin (MYCOSTATIN/NYSTOP) 100000 UNIT/GM POWD APPLY TOPICALLY 2 TIMES DAILY.AS DIRECTED  15 g  0  . omeprazole (PRILOSEC) 40 MG capsule Take 1 capsule (40 mg total) by mouth daily.  90 capsule  3  . promethazine (PHENERGAN) 25 MG tablet TAKE 1/2 TO 1 TABLET BY MOUTH EVERY 8 HOURS AS NEEDED FOR NAUSEA  30 tablet  0  . QUEtiapine (SEROQUEL) 300 MG tablet Take 300 mg by mouth 3 (three) times daily.        Marland Kitchen rOPINIRole (REQUIP) 3 MG tablet TAKE 1 TABLET BY MOUTH AT BEDTIME  30 tablet  1  . traMADol (ULTRAM) 50 MG tablet TAKE 1 TABLET (50 MG TOTAL) BY MOUTH 2 (TWO) TIMES DAILY AS NEEDED FOR PAIN.  60 tablet  0  . urea (CARMOL) 40 % CREA Apply nightly to feet      . vitamin D, CHOLECALCIFEROL, 400 UNITS tablet Take two by mouth daily  6 tablet  6  . zolpidem (AMBIEN)  10 MG tablet Take 10 mg by mouth at bedtime as needed. For sleep       The PMH, PSH, Social History, Family History,  Medications, and allergies have been reviewed in [facility], and have been updated if relevant.   Review of Systems See HPI  No nausea, vomiting or diarrhea. No CP or SOB    Objective:   Physical Exam BP 110/74  Pulse 97  Temp 97.5 F (36.4 C)  Wt 210 lb (95.255 kg)  Wt Readings from Last 3 Encounters:  06/12/12 210 lb (95.255 kg)  04/25/12 209 lb (94.802 kg)  04/02/12 208 lb 4 oz (94.462 kg)      Gen:  Alert, pleasant, NAD, no jaundice but does have some facial edma Psych:  Good eye contact.  Not anxious or depressed appearing.    Assessment & Plan:   1. Fibrosis of liver- >15 min spent with face to face with patient, >50% counseling and/or coordinating care.  Reviewed Dr. Kelby Fam note and biopsy results.  She is aware that she is at high risk for developing cirrhosis.  Discussed weight loss.  She will consider bariatric surgery.

## 2012-06-13 ENCOUNTER — Other Ambulatory Visit: Payer: Self-pay | Admitting: *Deleted

## 2012-06-13 MED ORDER — ERGOCALCIFEROL 1.25 MG (50000 UT) PO CAPS: 50000.0000 [IU] | ORAL_CAPSULE | ORAL | Status: DC

## 2012-06-20 ENCOUNTER — Other Ambulatory Visit: Payer: Self-pay | Admitting: Family Medicine

## 2012-06-22 ENCOUNTER — Emergency Department: Payer: Self-pay | Admitting: Emergency Medicine

## 2012-06-22 DIAGNOSIS — M25559 Pain in unspecified hip: Secondary | ICD-10-CM | POA: Diagnosis not present

## 2012-06-22 DIAGNOSIS — IMO0002 Reserved for concepts with insufficient information to code with codable children: Secondary | ICD-10-CM | POA: Diagnosis not present

## 2012-06-22 DIAGNOSIS — S79929A Unspecified injury of unspecified thigh, initial encounter: Secondary | ICD-10-CM | POA: Diagnosis not present

## 2012-06-22 DIAGNOSIS — Z79899 Other long term (current) drug therapy: Secondary | ICD-10-CM | POA: Diagnosis not present

## 2012-06-22 DIAGNOSIS — K7460 Unspecified cirrhosis of liver: Secondary | ICD-10-CM | POA: Diagnosis not present

## 2012-06-22 DIAGNOSIS — M25569 Pain in unspecified knee: Secondary | ICD-10-CM | POA: Diagnosis not present

## 2012-06-22 DIAGNOSIS — F17200 Nicotine dependence, unspecified, uncomplicated: Secondary | ICD-10-CM | POA: Diagnosis not present

## 2012-06-22 DIAGNOSIS — S79919A Unspecified injury of unspecified hip, initial encounter: Secondary | ICD-10-CM | POA: Diagnosis not present

## 2012-06-22 DIAGNOSIS — S8000XA Contusion of unspecified knee, initial encounter: Secondary | ICD-10-CM | POA: Diagnosis not present

## 2012-06-22 DIAGNOSIS — T148XXA Other injury of unspecified body region, initial encounter: Secondary | ICD-10-CM | POA: Diagnosis not present

## 2012-06-23 ENCOUNTER — Other Ambulatory Visit: Payer: Self-pay | Admitting: Family Medicine

## 2012-06-26 ENCOUNTER — Ambulatory Visit
Admission: RE | Admit: 2012-06-26 | Discharge: 2012-06-26 | Disposition: A | Discharge status: Home / Self Care | Payer: Medicare Other | Source: Ambulatory Visit | Attending: Thoracic Surgery (Cardiothoracic Vascular Surgery) | Admitting: Thoracic Surgery (Cardiothoracic Vascular Surgery)

## 2012-06-26 ENCOUNTER — Encounter: Payer: Self-pay | Admitting: Thoracic Surgery (Cardiothoracic Vascular Surgery)

## 2012-06-26 ENCOUNTER — Ambulatory Visit (INDEPENDENT_AMBULATORY_CARE_PROVIDER_SITE_OTHER): Payer: Medicare Other | Admitting: Thoracic Surgery (Cardiothoracic Vascular Surgery)

## 2012-06-26 VITALS — BP 132/80 | HR 96 | Resp 20 | Ht 61.75 in | Wt 210.0 lb

## 2012-06-26 DIAGNOSIS — D381 Neoplasm of uncertain behavior of trachea, bronchus and lung: Secondary | ICD-10-CM

## 2012-06-26 DIAGNOSIS — I7100 Dissection of unspecified site of aorta: Secondary | ICD-10-CM

## 2012-06-26 DIAGNOSIS — R911 Solitary pulmonary nodule: Secondary | ICD-10-CM

## 2012-06-26 NOTE — Progress Notes (Signed)
HPI: Stephanie [name] is a 62 year old woman with multiple medical problems including cirrhosis, ascites, COPD, sleep apnea, among others. She was found in October of 2011 to have a small right apical lung nodule. This is been followed with serial CT scans, most recently in January of 2013. She was being followed by Dr. Marlyn Corporal who has subsequently retired. He recommended one final scan to be done today.  She says that she has not had any new respiratory symptoms. She has gained considerable weight (20 pounds in 4 months) recently. She has noted an increase in her abdominal girth.  Past Medical History  Diagnosis Date  . Adenomatous colon polyp   . Insomnia   . RLS (restless legs syndrome)   . Colon polyp   . Incontinence   . Hepatitis B, chronic   . H/O: rheumatic fever   . Hyperlipidemia   . Unspecified disorders of nervous system   . Bipolar affective disorder     h/o  . H/O: CVA (cardiovascular accident)     TIA  . Fatty liver 04/09/08    found in abd CT  . Shortness of breath   . Obstructive sleep apnea     not using CPAP, last sleep study 2.5 years ago, per pt doctor is aware  . Blood transfusion 2000  . GERD (gastroesophageal reflux disease)   . Arthritis   . Aortic dissection     Type 1  . COPD (chronic obstructive pulmonary disease)   . Anxiety   . Depression   . Heart murmur     as a child  . Stroke     TIA- 2002  . Ulcer       Current Outpatient Prescriptions  Medication Sig Dispense Refill  . ABILIFY 30 MG tablet TAKE 1 TABLET BY MOUTH EVERY MORNING  30 tablet  0  . clonazePAM (KLONOPIN) 0.5 MG tablet TAKE 1 TABLET BY MOUTH TWICE A DAY AS NEEDED FOR ANXIETY  60 tablet  0  . CVS STOOL SOFTENER 100 MG capsule TAKE 1 CAPSULE BY MOUTH 2 TIMES DAILY AS NEEDED FOR CONSTIPATION  60 capsule  3  . diazepam (VALIUM) 5 MG tablet TAKE 1 TABLET BY MOUTH TWICE A DAY AS NEEDED FOR MUSCLE SPASMS  60 tablet  0  . ergocalciferol (VITAMIN D2) 50000 UNITS capsule Take 1  capsule (50,000 Units total) by mouth once a week.  6 capsule  0  . estradiol (VIVELLE-DOT) 0.1 MG/24HR Place 1 patch (0.1 mg total) onto the skin 2 (two) times a week.  8 patch  12  . lamoTRIgine (LAMICTAL) 100 MG tablet Take 3 tablets by mouth once daily      . LIDODERM 5 % PLACE 1 PATCH ONTO THE SKIN DAILY. REMOVE & DISCARD PATCH WITHIN 12 HOURS OR AS DIRECTED BY MD  30 patch  0  . metFORMIN (GLUCOPHAGE-XR) 500 MG 24 hr tablet TAKE 1 TABLET (500 MG TOTAL) BY MOUTH DAILY WITH BREAKFAST.  30 tablet  6  . mirtazapine (REMERON) 15 MG tablet Take 15 mg by mouth at bedtime.      Marland Kitchen nystatin (MYCOSTATIN/NYSTOP) 100000 UNIT/GM POWD APPLY TOPICALLY 2 TIMES DAILY.AS DIRECTED  15 g  0  . omeprazole (PRILOSEC) 40 MG capsule Take 1 capsule (40 mg total) by mouth daily.  90 capsule  3  . promethazine (PHENERGAN) 25 MG tablet TAKE 1/2 TO 1 TABLET BY MOUTH EVERY 8 HOURS AS NEEDED FOR NAUSEA  30 tablet  0  . QUEtiapine (SEROQUEL)  300 MG tablet Take 300 mg by mouth 3 (three) times daily.        Marland Kitchen rOPINIRole (REQUIP) 3 MG tablet TAKE 1 TABLET BY MOUTH AT BEDTIME  30 tablet  1  . traMADol (ULTRAM) 50 MG tablet TAKE 1 TABLET (50 MG TOTAL) BY MOUTH 2 (TWO) TIMES DAILY AS NEEDED FOR PAIN.  60 tablet  0  . urea (CARMOL) 40 % CREA Apply nightly to feet      . vitamin D, CHOLECALCIFEROL, 400 UNITS tablet Take two by mouth daily  6 tablet  6  . zolpidem (AMBIEN) 10 MG tablet Take 10 mg by mouth at bedtime as needed. For sleep        Physical Exam BP 132/80  Pulse 96  Resp 20  Ht 5' 1.75" (1.568 m)  Wt 210 lb (95.255 kg)  BMI 38.72 kg/m2  SpO2 56% Obese 62 year old white female in no acute distress Lungs diminished breath sounds bilaterally, no rales or wheezing No cervical or subclavicular adenopathy  Diagnostic Tests: CT of chest 06/26/2012  1. The ground-glass opacity in the right lung apex measures 8 mm compared to prior measurement of 5 mm in 2012 and 7 mm in 2013. Continued follow-up is recommended. 2.  Stable noncalcified 3-mm left lower lobe nodule. 3. No mediastinal or hilar adenopathy. 4. Probable fatty infiltration of the liver.   Impression: Stephanie [name] is a 9 year old woman with a small right apical lung nodule. This is been followed since October of 2011. This nodule may have grown very minimally over the past year. We will plan to repeat her CT in one year.   Plan: Repeat CT of chest to followup right apical nodule in one year

## 2012-06-27 ENCOUNTER — Other Ambulatory Visit: Payer: Self-pay | Admitting: Family Medicine

## 2012-06-28 ENCOUNTER — Encounter: Payer: Self-pay | Admitting: Family Medicine

## 2012-06-28 ENCOUNTER — Ambulatory Visit (INDEPENDENT_AMBULATORY_CARE_PROVIDER_SITE_OTHER): Payer: Medicare Other | Admitting: Family Medicine

## 2012-06-28 VITALS — BP 110/60 | HR 80 | Temp 97.6°F | Wt 211.0 lb

## 2012-06-28 DIAGNOSIS — W101XXA Fall (on)(from) sidewalk curb, initial encounter: Secondary | ICD-10-CM | POA: Diagnosis not present

## 2012-06-28 DIAGNOSIS — IMO0001 Reserved for inherently not codable concepts without codable children: Secondary | ICD-10-CM

## 2012-06-28 MED ORDER — TRAMADOL HCL 50 MG PO TABS: 50.0000 mg | ORAL_TABLET | Freq: Four times a day (QID) | ORAL | Status: DC | PRN

## 2012-06-28 NOTE — Progress Notes (Signed)
Subjective:    Patient ID: Stephanie [name], female    DOB: April 01, 1951, 62 y.o.   MRN: [id number]  HPI  62 yo here for ER follow up- we do not yet have records but have requested them.  Went to [facility] on 06/22/2012 per pt, after she tripped off of a curb and landed on her chest. Per pt, chest xray and hip xray were neg for fractures.  Now under neath both of her breasts is very sore to touch and with deep breaths.  Patient Active Problem List  Diagnosis  . Benign Neoplasm of Colon  . UNSPECIFIED VITAMIN D DEFICIENCY  . HYPERLIPIDEMIA  . TOBACCO ABUSE  . OBSTRUCTIVE SLEEP APNEA  . RESTLESS LEGS SYNDROME  . DISORDER, NERVOUS SYSTEM NOS  . RHEUMATIC FEVER  . HYPERTENSION  . RHINITIS  . COPD  . SMALL BOWEL OBSTRUCTION  . OTHER CONSTIPATION  . CELLULITIS AND ABSCESS OF OTHER SPECIFIED SITE  . FOLLICULITIS  . SKIN LESION  . HIP PAIN  . BACK PAIN, LUMBAR, CHRONIC  . SCIATICA, LEFT  . INSOMNIA  . EDEMA, LOCALIZED  . NAUSEA ALONE  . INCONTINENCE  . ABDOMINAL PAIN  . PELVIC PAIN, ACUTE  . MAMMOGRAM, ABNORMAL, RIGHT  . UNS ADVRS EFF OTH RX MEDICINAL&BIOLOGICAL SBSTNC  . BIPOLAR AFFECTIVE DISORDER, HX OF  . PERSONAL HISTORY OF COLONIC POLYPS  . DYSURIA  . INTERTRIGO, CANDIDAL  . RIB PAIN, RIGHT SIDED  . Routine general medical examination at a health care facility  . Hyperglycemia  . Localized swelling, mass, or lump of lower extremity  . Preop examination  . Aortic dissection  . Anxiety  . Fatigue  . Leg edema  . Cirrhosis of liver  . Acute renal insufficiency  . Left flank pain   Past Medical History  Diagnosis Date  . Adenomatous colon polyp   . Insomnia   . RLS (restless legs syndrome)   . Colon polyp   . Incontinence   . Hepatitis B, chronic   . H/O: rheumatic fever   . Hyperlipidemia   . Unspecified disorders of nervous system   . Bipolar affective disorder     h/o  . H/O: CVA (cardiovascular accident)     TIA  . Fatty liver 04/09/08    found in abd CT   . Shortness of breath   . Obstructive sleep apnea     not using CPAP, last sleep study 2.5 years ago, per pt doctor is aware  . Blood transfusion 2000  . GERD (gastroesophageal reflux disease)   . Arthritis   . Aortic dissection     Type 1  . COPD (chronic obstructive pulmonary disease)   . Anxiety   . Depression   . Heart murmur     as a child  . Stroke     TIA- 2002  . Ulcer    Past Surgical History  Procedure Date  . Back surgery     x 45  . Cholecystectomy   . Rotator cuff repair     left  . Abdominal hysterectomy     total  . Lumbar fusion 10/09  . Hemorroidectomy     and colon polyp removed  . Abdominal exploration surgery   . Posterior cervical fusion/foraminotomy   . Eye surgery     bilat  . Lumbar wound debridement 05/09/2011    Procedure: LUMBAR WOUND DEBRIDEMENT;  Surgeon: Dahlia Bailiff;  Location: [facility];  Service: Orthopedics;  Laterality: N/A;  IRRIGATION AND  DEBRIDEMENT SPINAL WOUND  . Axillary artery cannulation via 8-mm hemashield graft, median sternotomy, extracorporeal circulation with deep hypothermic circulatory arrest, repair of  aortic dessection 01/23/2009    Dr Arlyce Dice  . Cataract     both eyes  . Colonoscopy   . Liver biopsy 04/10/2012    Procedure: LIVER BIOPSY;  Surgeon: Inda Castle, MD;  Location: [facility];  Service: Endoscopy;  Laterality: N/A;  ultrasound to mark order for abd limited/liver to be marked to be sent by [email or web address]    History  Substance Use Topics  . Smoking status: Current Every Day Smoker -- 0.5 packs/day    Types: Cigarettes  . Smokeless tobacco: Never Used     Comment: Down to 1/2 a pack   . Alcohol Use: No   Family History  Problem Relation Age of Onset  . Colon cancer Neg Hx   . Esophageal cancer Neg Hx   . Rectal cancer Neg Hx   . Breast cancer Mother   . Lung cancer Mother   . Stomach cancer Father   . Diabetes      4 aunts, and 1 uncle   No Known Allergies Current Outpatient Prescriptions on  File Prior to Visit  Medication Sig Dispense Refill  . ABILIFY 30 MG tablet TAKE 1 TABLET BY MOUTH EVERY MORNING  30 tablet  0  . clonazePAM (KLONOPIN) 0.5 MG tablet TAKE 1 TABLET BY MOUTH TWICE A DAY AS NEEDED FOR ANXIETY  60 tablet  0  . [facility]S STOOL SOFTENER 100 MG capsule TAKE 1 CAPSULE BY MOUTH 2 TIMES DAILY AS NEEDED FOR CONSTIPATION  60 capsule  3  . diazepam (VALIUM) 5 MG tablet TAKE 1 TABLET BY MOUTH TWICE A DAY AS NEEDED FOR MUSCLE SPASMS  60 tablet  0  . ergocalciferol (VITAMIN D2) 50000 UNITS capsule Take 1 capsule (50,000 Units total) by mouth once a week.  6 capsule  0  . estradiol (VIVELLE-DOT) 0.1 MG/24HR Place 1 patch (0.1 mg total) onto the skin 2 (two) times a week.  8 patch  12  . lamoTRIgine (LAMICTAL) 100 MG tablet Take 3 tablets by mouth once daily      . LIDODERM 5 % PLACE 1 PATCH ONTO THE SKIN DAILY. REMOVE & DISCARD PATCH WITHIN 12 HOURS OR AS DIRECTED BY MD  30 patch  0  . metFORMIN (GLUCOPHAGE-XR) 500 MG 24 hr tablet TAKE 1 TABLET (500 MG TOTAL) BY MOUTH DAILY WITH BREAKFAST.  30 tablet  6  . mirtazapine (REMERON) 15 MG tablet Take 15 mg by mouth at bedtime.      Marland Kitchen nystatin (MYCOSTATIN/NYSTOP) 100000 UNIT/GM POWD APPLY TOPICALLY 2 TIMES DAILY.AS DIRECTED  15 g  0  . omeprazole (PRILOSEC) 40 MG capsule Take 1 capsule (40 mg total) by mouth daily.  90 capsule  3  . promethazine (PHENERGAN) 25 MG tablet TAKE 1/2 TO 1 TABLET BY MOUTH EVERY 8 HOURS AS NEEDED FOR NAUSEA  30 tablet  0  . QUEtiapine (SEROQUEL) 300 MG tablet Take 300 mg by mouth 3 (three) times daily.        Marland Kitchen rOPINIRole (REQUIP) 3 MG tablet TAKE 1 TABLET BY MOUTH AT BEDTIME  30 tablet  1  . urea (CARMOL) 40 % CREA Apply nightly to feet      . vitamin D, CHOLECALCIFEROL, 400 UNITS tablet Take two by mouth daily  6 tablet  6  . zolpidem (AMBIEN) 10 MG tablet Take 10 mg by mouth at bedtime as needed.  For sleep       The PMH, PSH, Social History, Family History, Medications, and allergies have been reviewed in [facility],  and have been updated if relevant.   Review of Systems No SOB    Objective:   Physical Exam BP 110/60  Pulse 80  Temp 97.6 F (36.4 C)  Wt 211 lb (95.709 kg) Gen:  Alert, pleasant, NAD Chest: Some echymosis on bilateral breasts, TTP around breast tissue, no masses felt, some tender to left inferior rib cage Lungs clear       Assessment & Plan:  1.  Pain status post fall- Awaiting records but exam reassuring.  I suspect she will be quite sore for several more days given the amount of bruising on her chest. Continue Tramadol as needed for pain. Call or return to clinic prn if these symptoms worsen or fail to improve as anticipated.

## 2012-06-28 NOTE — Patient Instructions (Addendum)
Good to see you. I am sorry that you fell. We will get your xray results from [facility]. Go home and rest.  Take Tramadol as directed.

## 2012-06-30 ENCOUNTER — Emergency Department: Payer: Self-pay | Admitting: Emergency Medicine

## 2012-06-30 DIAGNOSIS — Z9089 Acquired absence of other organs: Secondary | ICD-10-CM | POA: Diagnosis not present

## 2012-06-30 DIAGNOSIS — Z9079 Acquired absence of other genital organ(s): Secondary | ICD-10-CM | POA: Diagnosis not present

## 2012-06-30 DIAGNOSIS — Z8619 Personal history of other infectious and parasitic diseases: Secondary | ICD-10-CM | POA: Diagnosis not present

## 2012-06-30 DIAGNOSIS — R071 Chest pain on breathing: Secondary | ICD-10-CM | POA: Diagnosis not present

## 2012-06-30 DIAGNOSIS — J449 Chronic obstructive pulmonary disease, unspecified: Secondary | ICD-10-CM | POA: Diagnosis not present

## 2012-06-30 DIAGNOSIS — R0789 Other chest pain: Secondary | ICD-10-CM | POA: Diagnosis not present

## 2012-06-30 DIAGNOSIS — R079 Chest pain, unspecified: Secondary | ICD-10-CM | POA: Diagnosis not present

## 2012-06-30 DIAGNOSIS — K7460 Unspecified cirrhosis of liver: Secondary | ICD-10-CM | POA: Diagnosis not present

## 2012-06-30 DIAGNOSIS — S20219A Contusion of unspecified front wall of thorax, initial encounter: Secondary | ICD-10-CM | POA: Diagnosis not present

## 2012-06-30 DIAGNOSIS — J9819 Other pulmonary collapse: Secondary | ICD-10-CM | POA: Diagnosis not present

## 2012-06-30 DIAGNOSIS — R1012 Left upper quadrant pain: Secondary | ICD-10-CM | POA: Diagnosis not present

## 2012-06-30 DIAGNOSIS — F17200 Nicotine dependence, unspecified, uncomplicated: Secondary | ICD-10-CM | POA: Diagnosis not present

## 2012-06-30 LAB — URINALYSIS, COMPLETE
Bacteria: NONE SEEN
Bilirubin,UR: NEGATIVE
Blood: NEGATIVE
Glucose,UR: NEGATIVE mg/dL (ref 0–75)
Nitrite: NEGATIVE
Protein: NEGATIVE
RBC,UR: 1 /HPF (ref 0–5)
WBC UR: 3 /HPF (ref 0–5)

## 2012-06-30 LAB — CK TOTAL AND CKMB (NOT AT ARMC): CK-MB: 0.6 ng/mL (ref 0.5–3.6)

## 2012-06-30 LAB — CBC
HGB: 13.2 g/dL (ref 12.0–16.0)
MCH: 29.6 pg (ref 26.0–34.0)
MCHC: 33.3 g/dL (ref 32.0–36.0)
RBC: 4.45 10*6/uL (ref 3.80–5.20)

## 2012-06-30 LAB — COMPREHENSIVE METABOLIC PANEL
Albumin: 3.5 g/dL (ref 3.4–5.0)
Alkaline Phosphatase: 185 U/L — ABNORMAL HIGH (ref 50–136)
BUN: 14 mg/dL (ref 7–18)
Calcium, Total: 8.8 mg/dL (ref 8.5–10.1)
Chloride: 106 mmol/L (ref 98–107)
Creatinine: 0.85 mg/dL (ref 0.60–1.30)
Glucose: 114 mg/dL — ABNORMAL HIGH (ref 65–99)
SGOT(AST): 34 U/L (ref 15–37)
SGPT (ALT): 37 U/L (ref 12–78)

## 2012-06-30 LAB — TROPONIN I: Troponin-I: <0.02 ng/mL

## 2012-07-01 DIAGNOSIS — R079 Chest pain, unspecified: Secondary | ICD-10-CM | POA: Diagnosis not present

## 2012-07-01 DIAGNOSIS — R1012 Left upper quadrant pain: Secondary | ICD-10-CM | POA: Diagnosis not present

## 2012-07-02 ENCOUNTER — Telehealth: Payer: Self-pay | Admitting: Family Medicine

## 2012-07-02 NOTE — Telephone Encounter (Signed)
Please call to check on pt.

## 2012-07-02 NOTE — Telephone Encounter (Signed)
[facility] Triage Call Report Triage Record Num: [id number] Operator: Oletta Lamas Patient Name: Stephanie [name] Call Date & Time: 06/30/2012 6:41:41PM Patient Phone: [phone] PCP: [name] Patient Gender: Female PCP Fax : [phone] Patient DOB: 24-Feb-1951 Practice Name: [facility] Reason for Call: Caller: [name]/Patient; PCP: [name] (Family Practice); CB#: [phone]. 06/20/12 [place] on 06/22/12 and had lever scan. She has been seen for breast pain that seems to be getting worse. Hurts more when she coughs and breathes in deeply. Afebrile. Emergent Sign/Symptom of "continuous cough causing difficulty breathing" per Cough Protocol (disposition-see in emergency room). Advised Patient to be seen and evaluated at the Emergency Room. Patient Agreed - Will go to [place] Advice Given. Protocol(s) Used: Cough - Adult Recommended Outcome per Protocol: See ED Immediately Reason for Outcome: Continuous cough causing difficulty breathing Care Advice: ~ Another adult should drive. ~ Do not give the patient anything to eat or drink. Write down provider's name. List or place the following in a bag for transport with the patient: current prescription and/or nonprescription medications; alternative treatments, therapies and medications; and street drugs. ~ 06/30/2012 6:59:43PM Page 1 of 1 [id number]

## 2012-07-04 ENCOUNTER — Telehealth: Payer: Self-pay

## 2012-07-04 ENCOUNTER — Other Ambulatory Visit: Payer: Self-pay | Admitting: Family Medicine

## 2012-07-04 NOTE — Telephone Encounter (Signed)
Pt finished Vit D 50,000 per week today. Pt wanted to know when to recheck Vit D level. Advised pt lab result instructions now should take Vit D 1600 IU daily and recheck in 8-12 weeks from 06/13/12. Pt voiced understanding and will call back to schedule appt.

## 2012-07-05 NOTE — Telephone Encounter (Signed)
Medicine called to pharmacy.

## 2012-07-09 ENCOUNTER — Ambulatory Visit: Payer: Medicare Other | Admitting: Family Medicine

## 2012-07-10 ENCOUNTER — Ambulatory Visit (INDEPENDENT_AMBULATORY_CARE_PROVIDER_SITE_OTHER): Payer: Medicare Other | Admitting: Family Medicine

## 2012-07-10 ENCOUNTER — Encounter: Payer: Self-pay | Admitting: Family Medicine

## 2012-07-10 VITALS — BP 106/70 | HR 100 | Temp 97.5°F | Wt 208.0 lb

## 2012-07-10 DIAGNOSIS — S20219A Contusion of unspecified front wall of thorax, initial encounter: Secondary | ICD-10-CM

## 2012-07-10 DIAGNOSIS — R350 Frequency of micturition: Secondary | ICD-10-CM | POA: Diagnosis not present

## 2012-07-10 DIAGNOSIS — R079 Chest pain, unspecified: Secondary | ICD-10-CM

## 2012-07-10 LAB — POCT URINALYSIS DIPSTICK
Ketones, UA: NEGATIVE
Protein, UA: NEGATIVE
Spec Grav, UA: 1.025
Urobilinogen, UA: NEGATIVE

## 2012-07-10 IMAGING — CR PELVIS - 1-2 VIEW
1 series · 1 of 1 positions shown · non-contrast
Comparison: none

REASON FOR EXAM: bilateral hip pain
COMMENTS:

[ap]
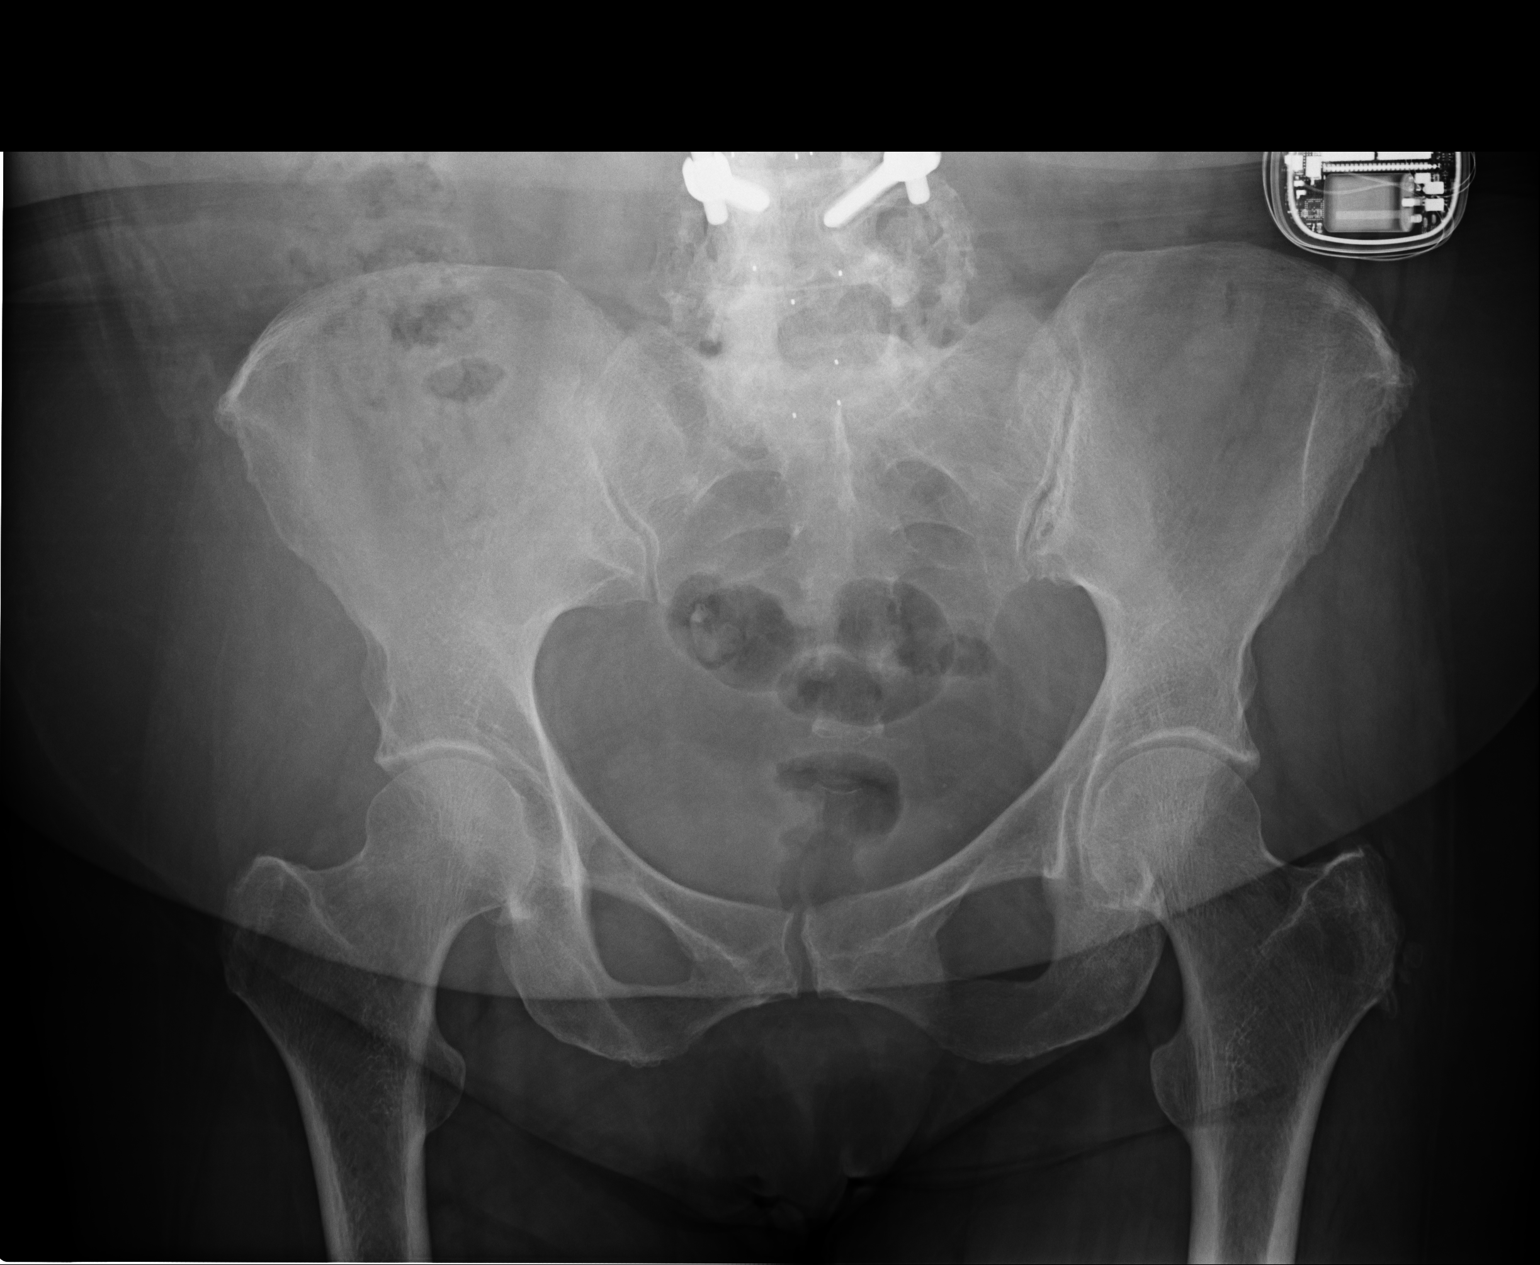

[1 of 1 positions shown; findings below may reference images not displayed]

PROCEDURE:     DXR - DXR PELVIS AP ONLY  - November 20, 2011  [DATE]

RESULT:     The bony pelvis is osteopenic. There is mild narrowing of the
hip joints. The patient has undergone previous posterior fusion of the lower
lumbar spine. There is a electronic generator projecting just above the left
iliac bone.
IMPRESSION: There is mild symmetric narrowing of the hip joints
bilaterally. I do not see evidence of acute fracture or dislocation.

[REDACTED]

## 2012-07-10 MED ORDER — CIPROFLOXACIN HCL 250 MG PO TABS: 250.0000 mg | ORAL_TABLET | Freq: Two times a day (BID) | ORAL | Status: AC

## 2012-07-10 MED ORDER — ALBUTEROL SULFATE HFA 108 (90 BASE) MCG/ACT IN AERS: 2.0000 | INHALATION_SPRAY | Freq: Four times a day (QID) | RESPIRATORY_TRACT | Status: DC | PRN

## 2012-07-10 NOTE — Patient Instructions (Addendum)
Good to see you.  Please take cipro as directed- 1 tablet twice daily for 7 days.

## 2012-07-10 NOTE — Progress Notes (Signed)
Subjective:    Patient ID: Stephanie [name], female    DOB: [birth year], 62 y.o.   MRN: [id number]  HPI  62 yo here for ER follow up.  Went to [facility] on 06/22/2012 and then again on 06/30/2012 after she tripped off of a curb and landed on her chest. She saw me for this in between those visits and I felt this was most likely a chest contusion.  ER notes from 1/25 reviewed- EKG NSR, Troponins neg. CT of chest, abdomen and pelvis- ?tiny fracture of superior articular process of T2 otherwise no acute findings.  Discharged home with chest contusion.   Now under neath both of her breasts is very sore to touch and with deep breaths.  She has noticed some increased urinary frequency.  No dysuria. UA in ER - pos for LE, pos mucous, otherwise neg. No cx.  Patient Active Problem List  Diagnosis  . Benign Neoplasm of Colon  . UNSPECIFIED VITAMIN D DEFICIENCY  . HYPERLIPIDEMIA  . TOBACCO ABUSE  . OBSTRUCTIVE SLEEP APNEA  . RESTLESS LEGS SYNDROME  . RHEUMATIC FEVER  . HYPERTENSION  . COPD  . BACK PAIN, LUMBAR, CHRONIC  . INSOMNIA  . NAUSEA ALONE  . UNS ADVRS EFF OTH RX MEDICINAL&BIOLOGICAL SBSTNC  . BIPOLAR AFFECTIVE DISORDER, HX OF  . Aortic dissection  . Anxiety  . Cirrhosis of liver  . Chest wall contusion   Past Medical History  Diagnosis Date  . Adenomatous colon polyp   . Insomnia   . RLS (restless legs syndrome)   . Colon polyp   . Incontinence   . Hepatitis B, chronic   . H/O: rheumatic fever   . Hyperlipidemia   . Unspecified disorders of nervous system   . Bipolar affective disorder     h/o  . H/O: CVA (cardiovascular accident)     TIA  . Fatty liver 04/09/08    found in abd CT  . Shortness of breath   . Obstructive sleep apnea     not using CPAP, last sleep study 2.5 years ago, per pt doctor is aware  . Blood transfusion 2000  . GERD (gastroesophageal reflux disease)   . Arthritis   . Aortic dissection     Type 1  . COPD (chronic obstructive pulmonary  disease)   . Anxiety   . Depression   . Heart murmur     as a child  . Stroke     TIA- 2002  . Ulcer    Past Surgical History  Procedure Date  . Back surgery     x 45  . Cholecystectomy   . Rotator cuff repair     left  . Abdominal hysterectomy     total  . Lumbar fusion 10/09  . Hemorroidectomy     and colon polyp removed  . Abdominal exploration surgery   . Posterior cervical fusion/foraminotomy   . Eye surgery     bilat  . Lumbar wound debridement 05/09/2011    Procedure: LUMBAR WOUND DEBRIDEMENT;  Surgeon: Dahlia Bailiff;  Location: [place];  Service: Orthopedics;  Laterality: N/A;  IRRIGATION AND DEBRIDEMENT SPINAL WOUND  . Axillary artery cannulation via 8-mm hemashield graft, median sternotomy, extracorporeal circulation with deep hypothermic circulatory arrest, repair of  aortic dessection 01/23/2009    Dr Arlyce Dice  . Cataract     both eyes  . Colonoscopy   . Liver biopsy 04/10/2012    Procedure: LIVER BIOPSY;  Surgeon: Inda Castle, MD;  Location: [facility];  Service: Endoscopy;  Laterality: N/A;  ultrasound to mark order for abd limited/liver to be marked to be sent by [email or web address]    History  Substance Use Topics  . Smoking status: Current Every Day Smoker -- 0.5 packs/day    Types: Cigarettes  . Smokeless tobacco: Never Used     Comment: Down to 1/2 a pack   . Alcohol Use: No   Family History  Problem Relation Age of Onset  . Colon cancer Neg Hx   . Esophageal cancer Neg Hx   . Rectal cancer Neg Hx   . Breast cancer Mother   . Lung cancer Mother   . Stomach cancer Father   . Diabetes      4 aunts, and 1 uncle   No Known Allergies Current Outpatient Prescriptions on File Prior to Visit  Medication Sig Dispense Refill  . ABILIFY 30 MG tablet TAKE 1 TABLET BY MOUTH EVERY MORNING  30 tablet  0  . clonazePAM (KLONOPIN) 0.5 MG tablet TAKE 1 TABLET BY MOUTH TWICE A DAY AS NEEDED FOR ANXIETY  60 tablet  0  . [facility] STOOL SOFTENER 100 MG capsule TAKE 1  CAPSULE BY MOUTH 2 TIMES DAILY AS NEEDED FOR CONSTIPATION  60 capsule  3  . diazepam (VALIUM) 5 MG tablet TAKE 1 TABLET BY MOUTH TWICE A DAY AS NEEDED FOR MUSCLE SPASMS  60 tablet  0  . ergocalciferol (VITAMIN D2) 50000 UNITS capsule Take 1 capsule (50,000 Units total) by mouth once a week.  6 capsule  0  . estradiol (VIVELLE-DOT) 0.1 MG/24HR Place 1 patch (0.1 mg total) onto the skin 2 (two) times a week.  8 patch  12  . lamoTRIgine (LAMICTAL) 100 MG tablet Take 3 tablets by mouth once daily      . LIDODERM 5 % PLACE 1 PATCH ONTO THE SKIN DAILY. REMOVE & DISCARD PATCH WITHIN 12 HOURS OR AS DIRECTED BY MD  30 patch  0  . metFORMIN (GLUCOPHAGE-XR) 500 MG 24 hr tablet TAKE 1 TABLET (500 MG TOTAL) BY MOUTH DAILY WITH BREAKFAST.  30 tablet  6  . mirtazapine (REMERON) 15 MG tablet Take 15 mg by mouth at bedtime.      Marland Kitchen nystatin (MYCOSTATIN/NYSTOP) 100000 UNIT/GM POWD APPLY TOPICALLY 2 TIMES DAILY.AS DIRECTED  15 g  0  . omeprazole (PRILOSEC) 40 MG capsule Take 1 capsule (40 mg total) by mouth daily.  90 capsule  3  . promethazine (PHENERGAN) 25 MG tablet TAKE 1/2 TO 1 TABLET BY MOUTH EVERY 8 HOURS AS NEEDED FOR NAUSEA  30 tablet  0  . QUEtiapine (SEROQUEL) 300 MG tablet Take 300 mg by mouth 3 (three) times daily.        Marland Kitchen rOPINIRole (REQUIP) 3 MG tablet TAKE 1 TABLET BY MOUTH AT BEDTIME  30 tablet  1  . traMADol (ULTRAM) 50 MG tablet Take 1 tablet (50 mg total) by mouth every 6 (six) hours as needed for pain.  60 tablet  0  . urea (CARMOL) 40 % CREA Apply nightly to feet      . vitamin D, CHOLECALCIFEROL, 400 UNITS tablet Take two by mouth daily  6 tablet  6  . zolpidem (AMBIEN) 10 MG tablet Take 10 mg by mouth at bedtime as needed. For sleep       The PMH, PSH, Social History, Family History, Medications, and allergies have been reviewed in [facility], and have been updated if relevant.   Review of Systems No  SOB    Objective:   Physical Exam BP 106/70  Pulse 100  Temp 97.5 F (36.4 C)  Wt 208 lb  (94.348 kg) Gen:  Alert, pleasant, NAD Chest: Some echymosis on bilateral breasts, TTP around breast tissue, no masses felt, some tender to left inferior rib cage Lungs clear       Assessment & Plan:  1.  Pain status post fall- Imagining from ER reassuring for chest contusion. I suspect she will be quite sore for several more days given the amount of bruising on her chest. Continue Tramadol as needed for pain. Call or return to clinic prn if these symptoms worsen or fail to improve as anticipated.  2.  Urinary frequency-  Pos UA in ER.  Will treat with cipro 250 mg twice daily x 7 days.  Awaiting cx results.  She attempted to leave a sample today in office but it was not enough to cx.

## 2012-07-12 DIAGNOSIS — H40019 Open angle with borderline findings, low risk, unspecified eye: Secondary | ICD-10-CM | POA: Diagnosis not present

## 2012-07-13 ENCOUNTER — Telehealth: Payer: Self-pay | Admitting: *Deleted

## 2012-07-13 NOTE — Telephone Encounter (Signed)
Prior Josem Kaufmann is needed for vivelle- dot.  This is not covered on [employer]'s formulary.  Form is on your desk.

## 2012-07-13 NOTE — Telephone Encounter (Signed)
Form signed but please ask pt what she has tried in past and if nothing, then what is on their formulary.

## 2012-07-16 ENCOUNTER — Telehealth: Payer: Self-pay

## 2012-07-16 DIAGNOSIS — F312 Bipolar disorder, current episode manic severe with psychotic features: Secondary | ICD-10-CM | POA: Diagnosis not present

## 2012-07-16 NOTE — Telephone Encounter (Signed)
Pt said she has finished antibiotic and pt said symptoms are better but still cannot make it to the restroom everytime before pt starts to urinate and pt is still having frequency of urine,no pain or burning when urinates. Pt wants to know if needs to check another urine specimen. [facility].

## 2012-07-16 NOTE — Telephone Encounter (Signed)
Yes since she was unable to leave a sample at last office visit, it would be best if she could leave a sample.

## 2012-07-17 ENCOUNTER — Ambulatory Visit (INDEPENDENT_AMBULATORY_CARE_PROVIDER_SITE_OTHER): Payer: Medicare Other | Admitting: Family Medicine

## 2012-07-17 DIAGNOSIS — R300 Dysuria: Secondary | ICD-10-CM

## 2012-07-17 LAB — POCT URINALYSIS DIPSTICK
Bilirubin, UA: NEGATIVE
Glucose, UA: NEGATIVE
Leukocytes, UA: NEGATIVE
Nitrite, UA: NEGATIVE
Urobilinogen, UA: NEGATIVE

## 2012-07-17 NOTE — Telephone Encounter (Signed)
Pt has never tried any other hrt, has been on this medicine for many years.  Patient states she will be willing to try something else if she has to.  Form faxed back to [place].

## 2012-07-17 NOTE — Telephone Encounter (Signed)
Pt left v/m requesting call back [phone].

## 2012-07-17 NOTE — Telephone Encounter (Signed)
Advised patient, she will come in this afternoon and leave specimen.

## 2012-07-18 NOTE — Progress Notes (Signed)
  Subjective:    Patient ID: Stephanie [name], female    DOB: [birth year], 62 y.o.   MRN: [id number]  HPI Presents for nurse visit for repeat UA.  Persistent dysuria.   Review of Systems     Objective:   Physical Exam        Assessment & Plan:

## 2012-07-21 IMAGING — CR DG CHEST 2V
2 series · 2 of 2 positions shown · non-contrast
Comparison: 06/22/2011

CLINICAL DATA: Cough

CHEST - 2 VIEW

[view not recorded (1 of 2)]
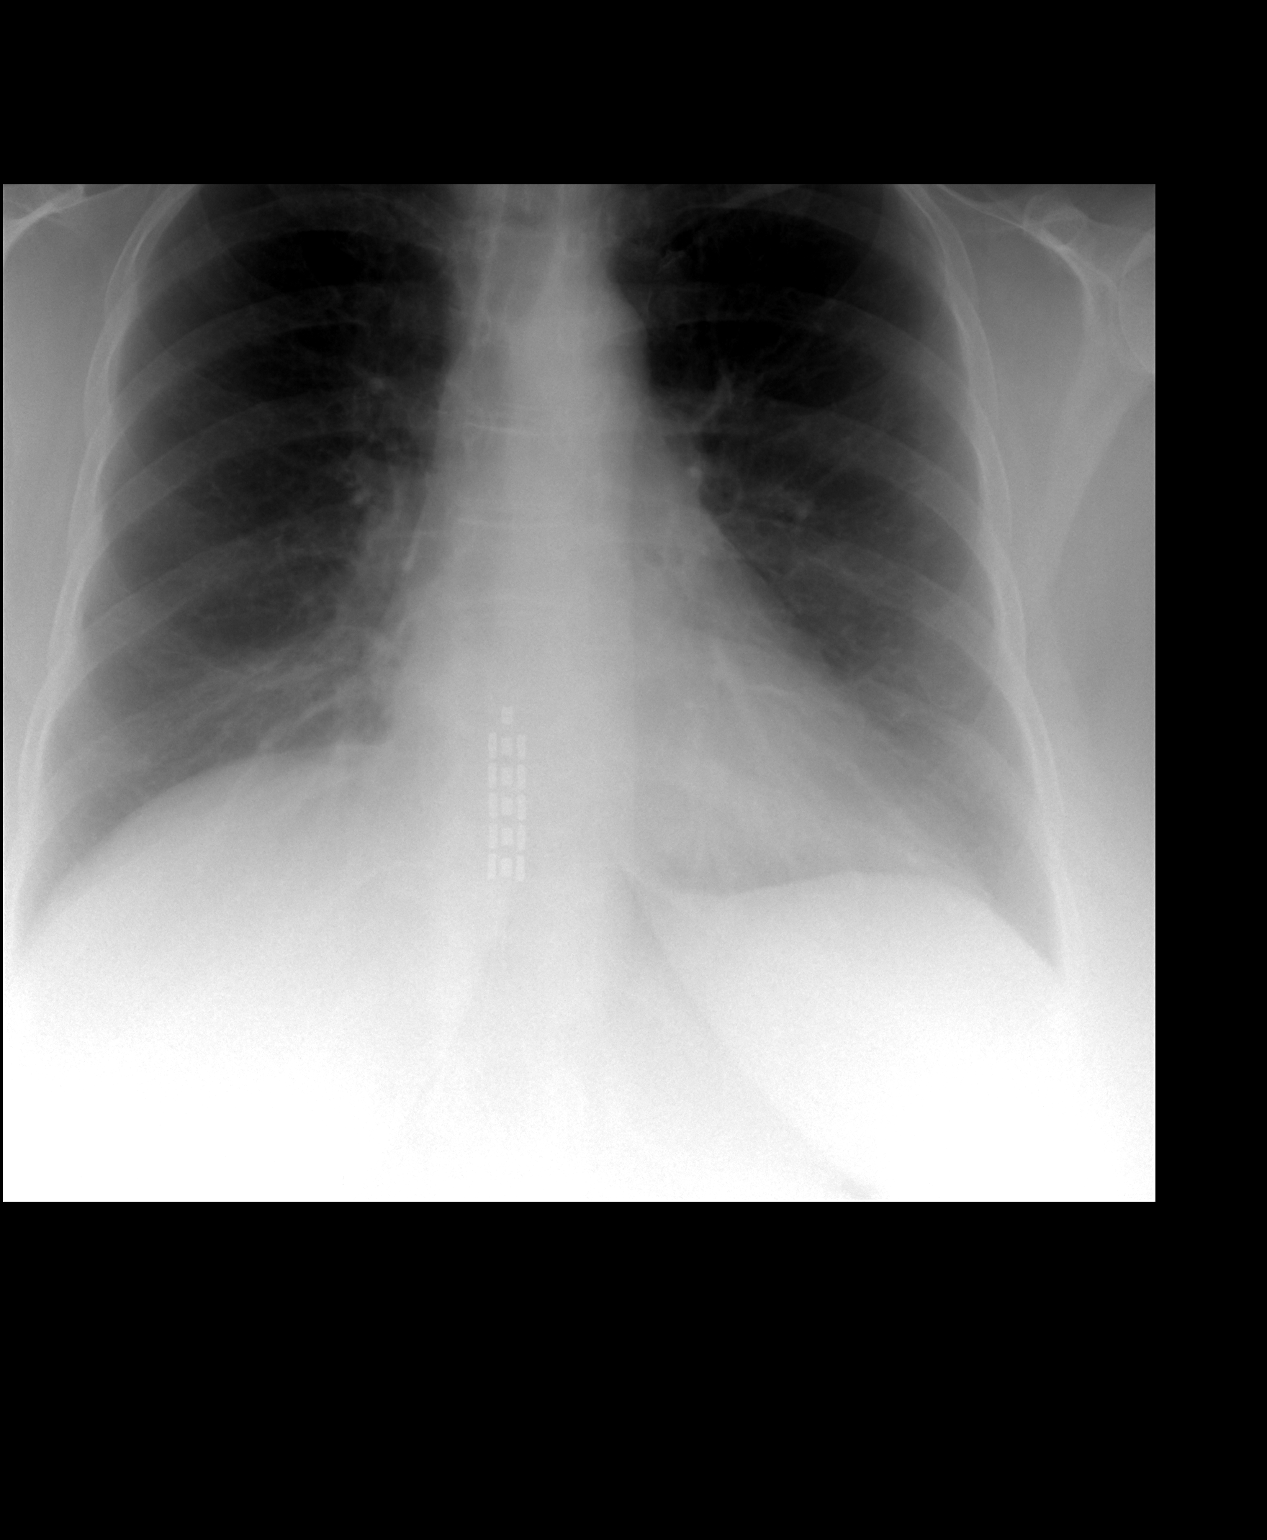

[view not recorded (2 of 2)]
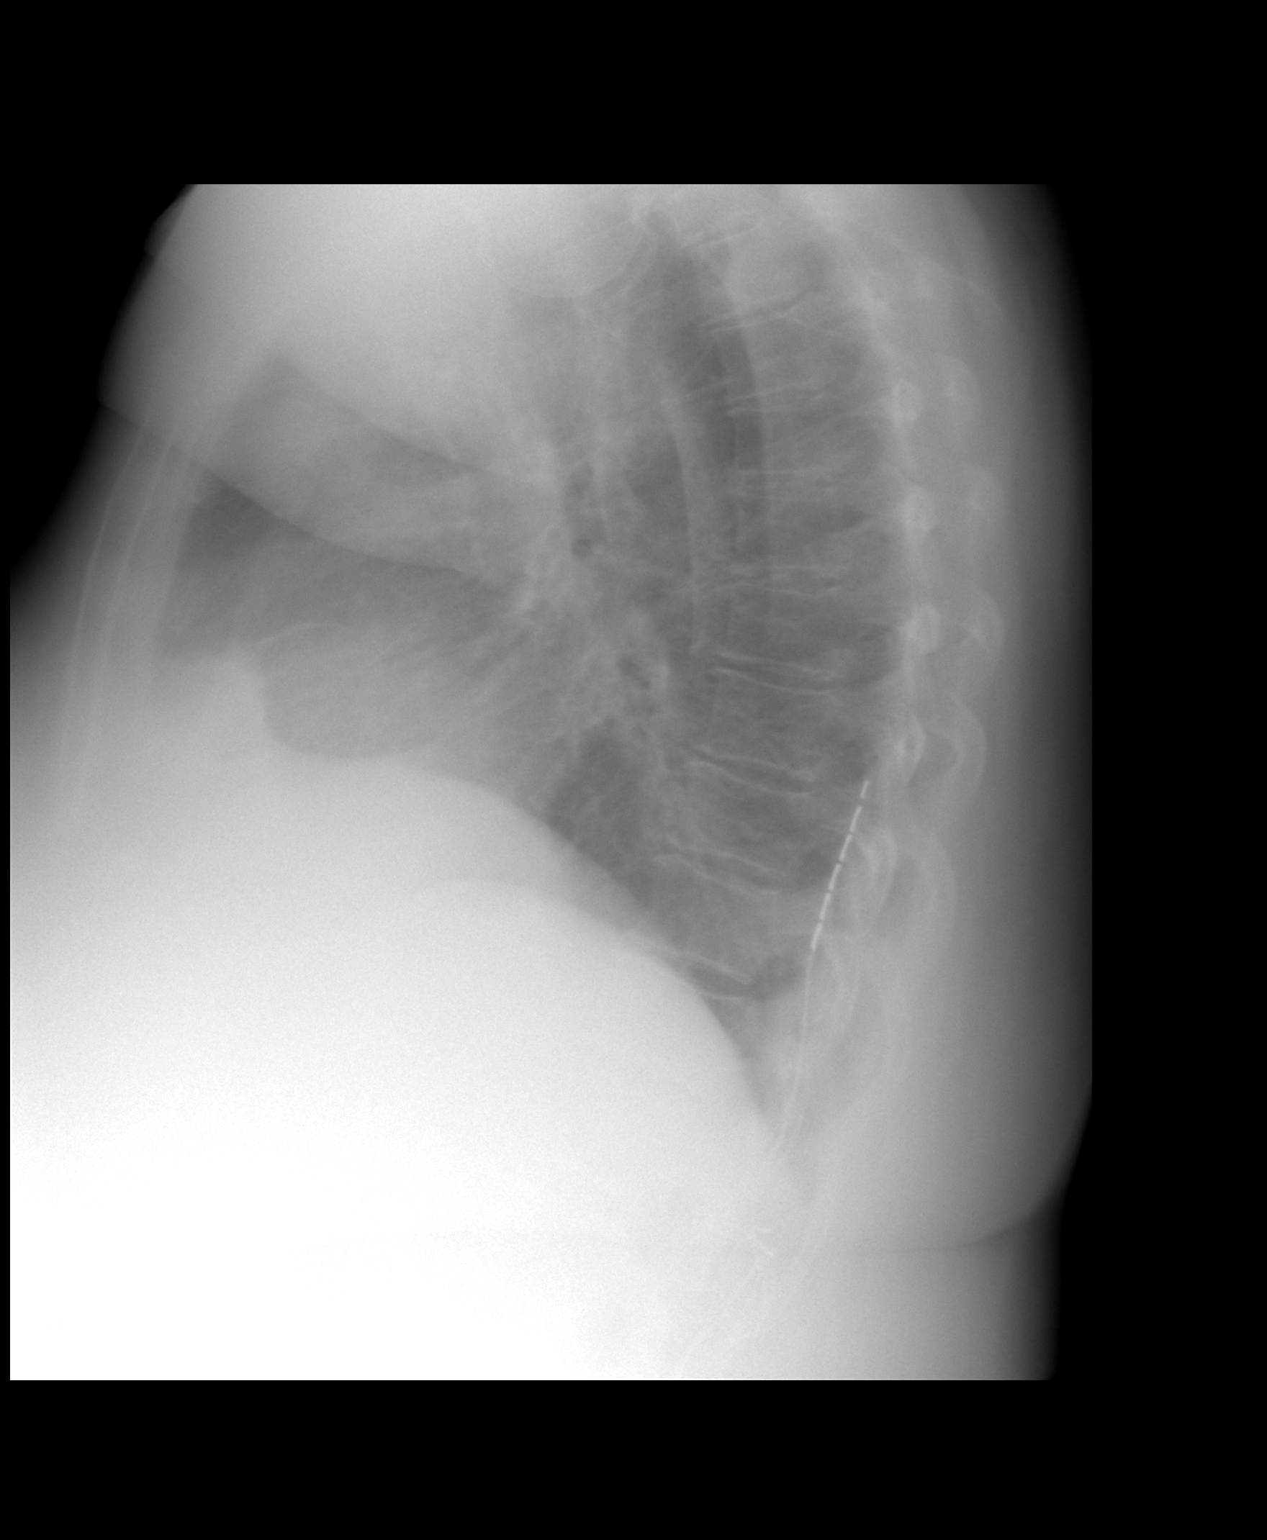

[2 of 2 positions shown; findings below may reference images not displayed]

FINDINGS: Heart size appears normal.

No pleural effusion or edema.

No airspace consolidation identified.

Spinal stimulator leads are noted within the posterior thoracic
column.
IMPRESSION: 1.  No acute cardiopulmonary abnormalities.

## 2012-07-23 NOTE — Telephone Encounter (Signed)
Prior auth for vivelle dot denied by insurance.  Denial letter placed on your desk for review.  Insurance requires trial of estradiol patch or alora first.

## 2012-07-23 NOTE — Telephone Encounter (Signed)
Note closed in error, please see notes below.

## 2012-07-25 NOTE — Telephone Encounter (Signed)
Pt called has enough vivelle dot patch to last until 07/31/12; pt wants to know if estradiol patch or alora first patch could be sent to [place].Please advise. Pt wanted note to go to Dr Aron's CMA.

## 2012-07-26 MED ORDER — ESTRADIOL 0.1 MG/24HR TD PTWK: 1.0000 | MEDICATED_PATCH | TRANSDERMAL | Status: DC

## 2012-07-26 NOTE — Addendum Note (Signed)
Addended by: Lucille Passy on: 07/26/2012 07:21 AM   Modules accepted: Orders

## 2012-07-26 NOTE — Telephone Encounter (Signed)
Advised patient that script has been sent to pharmacy.

## 2012-07-26 NOTE — Telephone Encounter (Signed)
Rx sent.  Please find make sure this is right patch, i.e the one her insurance will cover.

## 2012-08-01 ENCOUNTER — Telehealth: Payer: Self-pay

## 2012-08-01 ENCOUNTER — Other Ambulatory Visit: Payer: Self-pay | Admitting: Family Medicine

## 2012-08-01 NOTE — Telephone Encounter (Signed)
Pt cannot keep estradiol patch on for one week; patch comes off after 2 1/2 days. Pt applying patch to buttock; Pt has not tried to add extra tape to keep patch on but wanted Dr Hulen Shouts opinion first. Please advise.[facility].

## 2012-08-02 ENCOUNTER — Other Ambulatory Visit: Payer: Self-pay | Admitting: Family Medicine

## 2012-08-02 NOTE — Telephone Encounter (Signed)
Left message on machine advising patient.

## 2012-08-02 NOTE — Telephone Encounter (Signed)
Pt has wasted 2 patches this month due to not sticking; pt still has 2 patches; advised pt when applies last patch request refill from pharmacy letting them know she had problems with patch sticking and if problems getting rx early pt will call office.

## 2012-08-02 NOTE — Telephone Encounter (Signed)
Let's try extra tape to see if that works.

## 2012-08-03 ENCOUNTER — Telehealth: Payer: Self-pay

## 2012-08-03 NOTE — Telephone Encounter (Signed)
Pt called to scheduled lab appt to f/u vit d level; scheduled lab appt 08/23/12. Pt also wants to know when she should have mammogram and if it is time for mammogram would like for our office to schedule mammogram at [facility] in [place].

## 2012-08-03 NOTE — Telephone Encounter (Signed)
She not due for mammogram until 01/2013.

## 2012-08-06 ENCOUNTER — Other Ambulatory Visit: Payer: Self-pay | Admitting: Family Medicine

## 2012-08-06 ENCOUNTER — Telehealth: Payer: Self-pay | Admitting: *Deleted

## 2012-08-06 ENCOUNTER — Emergency Department: Payer: Self-pay | Admitting: Emergency Medicine

## 2012-08-06 DIAGNOSIS — IMO0002 Reserved for concepts with insufficient information to code with codable children: Secondary | ICD-10-CM | POA: Diagnosis not present

## 2012-08-06 DIAGNOSIS — S62609A Fracture of unspecified phalanx of unspecified finger, initial encounter for closed fracture: Secondary | ICD-10-CM | POA: Diagnosis not present

## 2012-08-06 DIAGNOSIS — M79609 Pain in unspecified limb: Secondary | ICD-10-CM | POA: Diagnosis not present

## 2012-08-06 DIAGNOSIS — F17200 Nicotine dependence, unspecified, uncomplicated: Secondary | ICD-10-CM | POA: Diagnosis not present

## 2012-08-06 DIAGNOSIS — Z79899 Other long term (current) drug therapy: Secondary | ICD-10-CM | POA: Diagnosis not present

## 2012-08-06 MED ORDER — ESTRADIOL 0.1 MG/24HR TD PTTW: 1.0000 | MEDICATED_PATCH | TRANSDERMAL | Status: DC

## 2012-08-06 NOTE — Telephone Encounter (Signed)
See previous phone note.  

## 2012-08-06 NOTE — Telephone Encounter (Signed)
I found it, but there are different strengths

## 2012-08-06 NOTE — Telephone Encounter (Signed)
Pt calls stating climera patches keep falling off, is there anything else she can try?

## 2012-08-06 NOTE — Telephone Encounter (Signed)
Advised patient regarding mammogram.  Also, pt is having a very difficult time keeping the estradiol patches on, says she has used every kind of tape she can think of and they wont stay on.  She has already used 4, so she doesn't have any left.  She has about 4 or 5 of her vivelle dot patches left and is asking if she can go ahead and use those.  Is there something else that she can try?

## 2012-08-06 NOTE — Telephone Encounter (Signed)
Let's call her pharmacy to see what alternative they have around the same cost.

## 2012-08-06 NOTE — Telephone Encounter (Signed)
I found it!  Thanks--rx sent.

## 2012-08-06 NOTE — Telephone Encounter (Signed)
It would but I cannot find it in Epic.  Could you please enter it for me? Thanks.

## 2012-08-06 NOTE — Telephone Encounter (Signed)
Insurance will cover alora patch.  Would that work for patient?

## 2012-08-07 NOTE — Telephone Encounter (Signed)
Advised patient

## 2012-08-16 ENCOUNTER — Other Ambulatory Visit: Payer: Self-pay

## 2012-08-16 ENCOUNTER — Other Ambulatory Visit: Payer: Self-pay | Admitting: Family Medicine

## 2012-08-16 ENCOUNTER — Other Ambulatory Visit: Payer: Self-pay | Admitting: *Deleted

## 2012-08-16 MED ORDER — LIDOCAINE 5 % EX PTCH: MEDICATED_PATCH | CUTANEOUS | Status: DC

## 2012-08-16 NOTE — Telephone Encounter (Signed)
Last filled 06/25/2012

## 2012-08-16 NOTE — Telephone Encounter (Signed)
Pt left v/m request refill lidoderm 5% patch to [facility]. Pt request call back when refilled.

## 2012-08-20 ENCOUNTER — Telehealth: Payer: Self-pay

## 2012-08-20 ENCOUNTER — Other Ambulatory Visit: Payer: Self-pay | Admitting: Family Medicine

## 2012-08-20 NOTE — Telephone Encounter (Signed)
Narcotics will only make her fall risk worse.  Is she still going to pain clinic?

## 2012-08-20 NOTE — Telephone Encounter (Signed)
Pt left v/m that Tramadol is not working for pain; pt states has fallen x 3 in past month. Pt said rt hip hurts almost constantly pt fell down 4 steps and landed on cement on rt hip on 08/17/12; pt wants different pain med sent to [facility] pt said she has been taking Tramadol 50 mg one in Am and two tabs at night S relief from hip pain.Pt does not want to schedule appt because she is sure hip is not broken but thinks it is severly bruised.Please advise.

## 2012-08-20 NOTE — Telephone Encounter (Signed)
Yes ok to increase tramadol to two 50 mg tablets in am and two 50 mg tablets in PM. Typically, you don't have increased falls with DECREASE in psychotropic medications.  I would bring this up with her psychiatrist though.

## 2012-08-20 NOTE — Telephone Encounter (Signed)
Advised patient as instructed.

## 2012-08-20 NOTE — Telephone Encounter (Signed)
Advised patient.  She says she hasnt been to the pain clinic in a long time.  She states that her mental health doctor has cut her lorazepam and seroquil doses and wants to know if you think that's why she's falling so much.  She's asking if it's ok for her to increase her tramadol dose to 2 in the morning and 2 at night.  Right now she's taking one in the morning and 2 at night.  Please advise.

## 2012-08-23 ENCOUNTER — Other Ambulatory Visit: Payer: Medicare Other

## 2012-08-23 ENCOUNTER — Telehealth: Payer: Self-pay

## 2012-08-23 DIAGNOSIS — E785 Hyperlipidemia, unspecified: Secondary | ICD-10-CM

## 2012-08-23 DIAGNOSIS — I10 Essential (primary) hypertension: Secondary | ICD-10-CM

## 2012-08-23 DIAGNOSIS — E559 Vitamin D deficiency, unspecified: Secondary | ICD-10-CM

## 2012-08-23 NOTE — Telephone Encounter (Signed)
Lab appt scheduled, advised patient.

## 2012-08-23 NOTE — Telephone Encounter (Signed)
Pt left v/m; pt had vit d level scheduled for today but pt cancelled and requested Dr Deborra Medina order complete panel of blood test for pt. Pt said long time since had blood panel.Please advise.

## 2012-08-23 NOTE — Telephone Encounter (Signed)
Orders entered

## 2012-08-24 ENCOUNTER — Other Ambulatory Visit: Payer: Self-pay | Admitting: Family Medicine

## 2012-08-24 NOTE — Telephone Encounter (Signed)
Script sent to pharmacy.

## 2012-08-27 ENCOUNTER — Other Ambulatory Visit (INDEPENDENT_AMBULATORY_CARE_PROVIDER_SITE_OTHER): Payer: Medicare Other

## 2012-08-27 ENCOUNTER — Telehealth: Payer: Self-pay | Admitting: *Deleted

## 2012-08-27 ENCOUNTER — Telehealth: Payer: Self-pay | Admitting: Pulmonary Disease

## 2012-08-27 ENCOUNTER — Other Ambulatory Visit: Payer: Self-pay | Admitting: Family Medicine

## 2012-08-27 DIAGNOSIS — E785 Hyperlipidemia, unspecified: Secondary | ICD-10-CM

## 2012-08-27 DIAGNOSIS — E559 Vitamin D deficiency, unspecified: Secondary | ICD-10-CM | POA: Diagnosis not present

## 2012-08-27 DIAGNOSIS — I10 Essential (primary) hypertension: Secondary | ICD-10-CM | POA: Diagnosis not present

## 2012-08-27 LAB — COMPREHENSIVE METABOLIC PANEL
ALT: 19 U/L (ref 0–35)
Albumin: 3.6 g/dL (ref 3.5–5.2)
CO2: 28 mEq/L (ref 19–32)
Calcium: 9.1 mg/dL (ref 8.4–10.5)
Chloride: 103 mEq/L (ref 96–112)
Creatinine, Ser: 0.9 mg/dL (ref 0.4–1.2)
GFR: 72.03 mL/min (ref >60.00)
Potassium: 4.1 mEq/L (ref 3.5–5.1)

## 2012-08-27 LAB — LIPID PANEL: HDL: 87.9 mg/dL (ref >39.00)

## 2012-08-27 LAB — HEMOGLOBIN A1C: Hgb A1c MFr Bld: 6.3 % (ref 4.6–6.5)

## 2012-08-27 NOTE — Telephone Encounter (Signed)
Please have her come in for UA first to make sure this is not due to UTI.

## 2012-08-27 NOTE — Telephone Encounter (Signed)
Pt states she is having a problem with incontinence and is asking if something can be called to [place].  States you have treated her for this before.

## 2012-08-27 NOTE — Telephone Encounter (Signed)
Spoke with patient, patient aware of recs to speak with PCP about these diagnoses. Patient aware and nothing further needed at this time.

## 2012-08-27 NOTE — Telephone Encounter (Signed)
Spoke with patient, patient states she received a "print out from the doctor" and at this bottom of it it had a list of diagnoses. Patient states it has on there she has COPD. Patient would like to know if she has COPD or if she just has Sleep Apnea. Dr. Halford Chessman please advise, Thank You   Patient last seen 01/2010 by Dr. Halford Chessman.

## 2012-08-27 NOTE — Telephone Encounter (Signed)
I have not seen patient since 2010.  She will need to discuss with her PCP.

## 2012-08-28 ENCOUNTER — Other Ambulatory Visit: Payer: Self-pay | Admitting: Family Medicine

## 2012-08-28 ENCOUNTER — Telehealth: Payer: Self-pay

## 2012-08-28 ENCOUNTER — Ambulatory Visit (INDEPENDENT_AMBULATORY_CARE_PROVIDER_SITE_OTHER): Payer: Medicare Other | Admitting: *Deleted

## 2012-08-28 DIAGNOSIS — N3941 Urge incontinence: Secondary | ICD-10-CM | POA: Diagnosis not present

## 2012-08-28 LAB — POCT URINALYSIS DIPSTICK
Blood, UA: NEGATIVE
Glucose, UA: NEGATIVE
Spec Grav, UA: 1.025
Urobilinogen, UA: NEGATIVE
pH, UA: 6

## 2012-08-28 LAB — VITAMIN D 25 HYDROXY (VIT D DEFICIENCY, FRACTURES): Vit D, 25-Hydroxy: 18 ng/mL — ABNORMAL LOW (ref 30–89)

## 2012-08-28 MED ORDER — ERGOCALCIFEROL 1.25 MG (50000 UT) PO CAPS: ORAL_CAPSULE | ORAL | Status: DC

## 2012-08-28 MED ORDER — TOLTERODINE TARTRATE ER 2 MG PO CP24: 2.0000 mg | ORAL_CAPSULE | Freq: Every day | ORAL | Status: DC

## 2012-08-28 NOTE — Telephone Encounter (Signed)
Pt said Dr Deborra Medina had told her when she fell that pt could take 2 Tramadol twice a day. Pt still having a lot of pain in rib cage and wants to continue taking tramadol 50 mg two tabs twice a day. Is it OK to continue taking this way. Please advise.

## 2012-08-28 NOTE — Telephone Encounter (Signed)
Ok to continue as requested  

## 2012-08-28 NOTE — Telephone Encounter (Signed)
Pt notified and scheduled nurse visit today at 3 pm.

## 2012-08-28 NOTE — Telephone Encounter (Signed)
Advised patient ok to take 2 twice a day while having increased pain.

## 2012-08-30 ENCOUNTER — Other Ambulatory Visit: Payer: Self-pay | Admitting: Family Medicine

## 2012-08-31 NOTE — Telephone Encounter (Signed)
[facility]

## 2012-09-05 ENCOUNTER — Other Ambulatory Visit: Payer: Self-pay | Admitting: Family Medicine

## 2012-09-06 ENCOUNTER — Encounter: Payer: Self-pay | Admitting: Family Medicine

## 2012-09-06 ENCOUNTER — Ambulatory Visit (INDEPENDENT_AMBULATORY_CARE_PROVIDER_SITE_OTHER): Payer: Medicare Other | Admitting: Family Medicine

## 2012-09-06 VITALS — BP 120/80 | HR 80 | Temp 97.5°F | Wt 207.0 lb

## 2012-09-06 DIAGNOSIS — L84 Corns and callosities: Secondary | ICD-10-CM

## 2012-09-06 MED ORDER — SALICYLIC ACID 6 % EX GEL: Freq: Every day | CUTANEOUS | Status: DC

## 2012-09-06 NOTE — Telephone Encounter (Signed)
Medicine called to pharmacy.

## 2012-09-06 NOTE — Progress Notes (Signed)
Subjective:    Patient ID: Stephanie [name], female    DOB: [birth year], 62 y.o.   MRN: [id number]  HPI  Here for corn or callus on her medial right foot- base of her big toe.  Has been there for months but past weeks, becoming very painful.  Has not tried any OTC treatments.  No erythema, swelling or warmth.  No fevers, worsening nausea or diarrhea.  Patient Active Problem List  Diagnosis  . Benign Neoplasm of Colon  . UNSPECIFIED VITAMIN D DEFICIENCY  . HYPERLIPIDEMIA  . TOBACCO ABUSE  . OBSTRUCTIVE SLEEP APNEA  . RESTLESS LEGS SYNDROME  . RHEUMATIC FEVER  . HYPERTENSION  . COPD  . BACK PAIN, LUMBAR, CHRONIC  . INSOMNIA  . NAUSEA ALONE  . UNS ADVRS EFF OTH RX MEDICINAL&BIOLOGICAL SBSTNC  . BIPOLAR AFFECTIVE DISORDER, HX OF  . Aortic dissection  . Anxiety  . Cirrhosis of liver  . Chest wall contusion  . Urinary frequency  . Callus of foot   Past Medical History  Diagnosis Date  . Adenomatous colon polyp   . Insomnia   . RLS (restless legs syndrome)   . Colon polyp   . Incontinence   . Hepatitis B, chronic   . H/O: rheumatic fever   . Hyperlipidemia   . Unspecified disorders of nervous system   . Bipolar affective disorder     h/o  . H/O: CVA (cardiovascular accident)     TIA  . Fatty liver 04/09/08    found in abd CT  . Shortness of breath   . Obstructive sleep apnea     not using CPAP, last sleep study 2.5 years ago, per pt doctor is aware  . Blood transfusion 2000  . GERD (gastroesophageal reflux disease)   . Arthritis   . Aortic dissection     Type 1  . COPD (chronic obstructive pulmonary disease)   . Anxiety   . Depression   . Heart murmur     as a child  . Stroke     TIA- 2002  . Ulcer    Past Surgical History  Procedure Laterality Date  . Back surgery      x 45  . Cholecystectomy    . Rotator cuff repair      left  . Abdominal hysterectomy      total  . Lumbar fusion  10/09  . Hemorroidectomy      and colon polyp removed  .  Abdominal exploration surgery    . Posterior cervical fusion/foraminotomy    . Eye surgery      bilat  . Lumbar wound debridement  05/09/2011    Procedure: LUMBAR WOUND DEBRIDEMENT;  Surgeon: Dahlia Bailiff;  Location: [place];  Service: Orthopedics;  Laterality: N/A;  IRRIGATION AND DEBRIDEMENT SPINAL WOUND  . Axillary artery cannulation via 8-mm hemashield graft, median sternotomy, extracorporeal circulation with deep hypothermic circulatory arrest, repair of  aortic dessection  01/23/2009    Dr Arlyce Dice  . Cataract      both eyes  . Colonoscopy    . Liver biopsy  04/10/2012    Procedure: LIVER BIOPSY;  Surgeon: Inda Castle, MD;  Location: [facility];  Service: Endoscopy;  Laterality: N/A;  ultrasound to mark order for abd limited/liver to be marked to be sent by [email or web address]    History  Substance Use Topics  . Smoking status: Current Every Day Smoker -- 0.50 packs/day    Types: Cigarettes  . Smokeless tobacco: Never Used  Comment: Down to 1/2 a pack   . Alcohol Use: No   Family History  Problem Relation Age of Onset  . Colon cancer Neg Hx   . Esophageal cancer Neg Hx   . Rectal cancer Neg Hx   . Breast cancer Mother   . Lung cancer Mother   . Stomach cancer Father   . Diabetes      4 aunts, and 1 uncle   No Known Allergies Current Outpatient Prescriptions on File Prior to Visit  Medication Sig Dispense Refill  . ABILIFY 30 MG tablet TAKE 1 TABLET BY MOUTH EVERY MORNING  30 tablet  0  . clonazePAM (KLONOPIN) 0.5 MG tablet TAKE 1 TABLET BY MOUTH TWICE A DAY AS NEEDED FOR ANXIETY  60 tablet  0  . [facility] STOOL SOFTENER 100 MG capsule TAKE 1 CAPSULE BY MOUTH 2 TIMES DAILY AS NEEDED FOR CONSTIPATION  60 capsule  3  . diazepam (VALIUM) 5 MG tablet TAKE 1 TABLET BY MOUTH TWICE A DAY AS NEEDED FOR MUSCLE SPASMS  60 tablet  0  . ergocalciferol (VITAMIN D2) 50000 UNITS capsule Take 1 capsule (50,000 Units total) by mouth once a week.  6 capsule  0  . ergocalciferol (VITAMIN D2)  50000 UNITS capsule Take one by mouth weekly x 6 weeks.  6 capsule  0  . estradiol (ALORA) 0.1 MG/24HR Place 1 patch (0.1 mg total) onto the skin 2 (two) times a week.  8 patch  12  . lamoTRIgine (LAMICTAL) 100 MG tablet Take 3 tablets by mouth once daily      . lidocaine (LIDODERM) 5 % PLACE 1 PATCH ONTO THE SKIN DAILY. REMOVE & DISCARD PATCH WITHIN 12 HOURS OR AS DIRECTED BY MD  30 patch  0  . metFORMIN (GLUCOPHAGE-XR) 500 MG 24 hr tablet TAKE 1 TABLET (500 MG TOTAL) BY MOUTH DAILY WITH BREAKFAST.  30 tablet  5  . metFORMIN (GLUCOPHAGE-XR) 500 MG 24 hr tablet TAKE 1 TABLET (500 MG TOTAL) BY MOUTH DAILY WITH BREAKFAST.  30 tablet  5  . mirtazapine (REMERON) 15 MG tablet Take 7.5 mg by mouth at bedtime.       Marland Kitchen nystatin (MYCOSTATIN/NYSTOP) 100000 UNIT/GM POWD APPLY TOPICALLY 2 TIMES DAILY.AS DIRECTED  15 g  0  . omeprazole (PRILOSEC) 40 MG capsule Take 1 capsule (40 mg total) by mouth daily.  90 capsule  3  . oxyCODONE-acetaminophen (PERCOCET/ROXICET) 5-325 MG per tablet Take 1 tablet by mouth every 6 (six) hours as needed.      Marland Kitchen PROAIR HFA 108 (90 BASE) MCG/ACT inhaler INHALE 2 PUFFS INTO THE LUNGS EVERY 6 (SIX) HOURS AS NEEDED FOR WHEEZING.  8.5 each  5  . promethazine (PHENERGAN) 25 MG tablet TAKE 1/2 TO 1 TABLET BY MOUTH EVERY 8 HOURS AS NEEDED FOR NAUSEA  30 tablet  0  . promethazine (PHENERGAN) 25 MG tablet TAKE 1/2 TO 1 TABLET BY MOUTH EVERY 8 HOURS AS NEEDED FOR NAUSEA  30 tablet  0  . QUEtiapine (SEROQUEL) 300 MG tablet Take 2 by mouth daily      . rOPINIRole (REQUIP) 3 MG tablet TAKE 1 TABLET BY MOUTH AT BEDTIME  30 tablet  1  . tolterodine (DETROL LA) 2 MG 24 hr capsule Take 1 capsule (2 mg total) by mouth daily.  30 capsule  0  . traMADol (ULTRAM) 50 MG tablet TAKE 1 TABLET (50 MG TOTAL) BY MOUTH EVERY 6 (SIX) HOURS AS NEEDED FOR PAIN.  60 tablet  0  . urea (CARMOL) 40 % CREA Apply nightly to feet      . vitamin D, CHOLECALCIFEROL, 400 UNITS tablet Take two by mouth daily  6 tablet  6   . zolpidem (AMBIEN) 10 MG tablet Take 10 mg by mouth at bedtime as needed. For sleep       No current facility-administered medications on file prior to visit.   The PMH, PSH, Social History, Family History, Medications, and allergies have been reviewed in [facility], and have been updated if relevant.    Review of Systems See HPI    Objective:   Physical Exam BP 120/80  Pulse 80  Temp(Src) 97.5 F (36.4 C)  Wt 207 lb (93.895 kg)  BMI 38.19 kg/m2  Gen: alert pleasant, NAD Ext: Firm, thick lesion on medial base of right 1st toe, no drainage or erythema.  Psych:  Good eye contact.  Not anxious or depressed appearing.    Assessment & Plan:  1. Callus of foot New- rx for salicylic acid gel given. Discussed normal course and tx of corns and calluses- given handout (see AVS). Call or return to clinic prn if these symptoms worsen or fail to improve as anticipated. The patient indicates understanding of these issues and agrees with the plan.

## 2012-09-06 NOTE — Patient Instructions (Signed)
Corns and Calluses Corns are small areas of thickened skin that usually occur on the top, sides, or tip of a toe. They contain a cone-shaped core with a point that can press on a nerve below. This causes pain. Calluses are areas of thickened skin that usually develop on hands, fingers, palms, soles of the feet, and heels. These are areas that experience frequent friction or pressure. CAUSES  Corns are usually the result of rubbing (friction) or pressure from shoes that are too tight or do not fit properly. Calluses are caused by repeated friction and pressure on the affected areas. SYMPTOMS  A hard growth on the skin.  Pain or tenderness under the skin.  Sometimes, redness and swelling.  Increased discomfort while wearing tight-fitting shoes. DIAGNOSIS  Your caregiver can usually tell what the problem is by doing a physical exam. TREATMENT  Removing the cause of the friction or pressure is usually the only treatment needed. However, sometimes medicines can be used to help soften the hardened, thickened areas. These medicines include salicylic acid plasters and 12% ammonium lactate lotion. These medicines should only be used under the direction of your caregiver. HOME CARE INSTRUCTIONS   Try to remove pressure from the affected area.  You may wear donut-shaped corn pads to protect your skin.  You may use a pumice stone or nonmetallic nail file to gently reduce the thickness of a corn.  Wear properly fitted footwear.  If you have calluses on the hands, wear gloves during activities that cause friction.  If you have diabetes, you should regularly examine your feet. Tell your caregiver if you notice any problems with your feet. SEEK IMMEDIATE MEDICAL CARE IF:   You have increased pain, swelling, redness, or warmth in the affected area.  Your corn or callus starts to drain fluid or bleeds.  You are not getting better, even with treatment. Document Released: 02/27/2004 Document  Revised: 08/15/2011 Document Reviewed: 01/18/2011 [facility] Patient Information 2013 [place].

## 2012-09-18 ENCOUNTER — Other Ambulatory Visit: Payer: Self-pay

## 2012-09-18 MED ORDER — TRAMADOL HCL 50 MG PO TABS: ORAL_TABLET | ORAL | Status: DC

## 2012-09-18 NOTE — Telephone Encounter (Signed)
Pt request refill Tramadol to [facility]. Pt is out of med.Please advise.

## 2012-09-18 NOTE — Telephone Encounter (Signed)
Pt notified refill sent electronically to [place].

## 2012-09-20 ENCOUNTER — Other Ambulatory Visit: Payer: Self-pay | Admitting: Family Medicine

## 2012-09-24 ENCOUNTER — Other Ambulatory Visit: Payer: Self-pay | Admitting: Family Medicine

## 2012-09-26 ENCOUNTER — Telehealth: Payer: Self-pay | Admitting: Family Medicine

## 2012-09-26 NOTE — Telephone Encounter (Signed)
I'm sorry to hear this.  Unfortunately, there are no stronger benzos (meds like valium and klonipin) that I can prescribe.  She needs to see her psychiatrist at this point for medication management.

## 2012-09-26 NOTE — Telephone Encounter (Signed)
Patient Information:  Caller Name: Rosaelena  Phone: [phone]  Patient: Stephanie [name]  Gender: Female  DOB: 1950-11-22  Age: 62 Years  PCP: [name] [facility])  Office Follow Up:  Does the office need to follow up with this patient?: Yes  Instructions For The Office: see RN notes  RN Note:  Patient is requesting something different than her Klonopin and Valium to help with anxiety. She has tried the Valium, but reports it is "not taking the edge off." Reports crying episodes and increasing anxiety.  Symptoms  Reason For Call & Symptoms: Patient has had 2 deaths in the family in the last week. Her son passed away on 08/23/22 and her mother-in-law before that.  Reviewed Health History In EMR: Yes  Reviewed Medications In EMR: Yes  Reviewed Allergies In EMR: Yes  Reviewed Surgeries / Procedures: Yes  Date of Onset of Symptoms: 09/20/2012  Treatments Tried: Valium  Treatments Tried Worked: No  Guideline(s) Used:  No Protocol Available - Sick Adult  Disposition Per Guideline:   Callback by PCP or Subspecialist within 1 Hour  Reason For Disposition Reached:   Nursing judgment  Advice Given:  N/A  Patient Will Follow Care Advice:  YES

## 2012-09-26 NOTE — Telephone Encounter (Signed)
Advised patient as instructed, she will call psychiatrist.

## 2012-09-27 ENCOUNTER — Other Ambulatory Visit: Payer: Self-pay | Admitting: Family Medicine

## 2012-09-28 ENCOUNTER — Other Ambulatory Visit: Payer: Self-pay | Admitting: Family Medicine

## 2012-09-28 NOTE — Telephone Encounter (Signed)
[facility]

## 2012-10-01 ENCOUNTER — Other Ambulatory Visit: Payer: Self-pay | Admitting: Family Medicine

## 2012-10-02 ENCOUNTER — Other Ambulatory Visit: Payer: Self-pay | Admitting: Family Medicine

## 2012-10-03 ENCOUNTER — Other Ambulatory Visit: Payer: Self-pay | Admitting: Family Medicine

## 2012-10-04 ENCOUNTER — Other Ambulatory Visit: Payer: Self-pay | Admitting: Family Medicine

## 2012-10-05 ENCOUNTER — Other Ambulatory Visit: Payer: Self-pay | Admitting: Family Medicine

## 2012-10-05 NOTE — Telephone Encounter (Deleted)
Pt requesting refill on Detrol LA.  Please advise.

## 2012-10-05 NOTE — Telephone Encounter (Signed)
Pt called to ck on status of Detrol La refill; spoke with Jana Half [facility] pt picked up on 09/27/11. Pt said she did not pick up med. Pt to call and speak with Jana Half at [place].

## 2012-10-08 DIAGNOSIS — F312 Bipolar disorder, current episode manic severe with psychotic features: Secondary | ICD-10-CM | POA: Diagnosis not present

## 2012-10-08 NOTE — Telephone Encounter (Signed)
[facility]

## 2012-10-09 ENCOUNTER — Other Ambulatory Visit (INDEPENDENT_AMBULATORY_CARE_PROVIDER_SITE_OTHER): Payer: Medicare Other

## 2012-10-09 ENCOUNTER — Ambulatory Visit (INDEPENDENT_AMBULATORY_CARE_PROVIDER_SITE_OTHER): Payer: Medicare Other | Admitting: Family Medicine

## 2012-10-09 ENCOUNTER — Encounter: Payer: Self-pay | Admitting: Family Medicine

## 2012-10-09 VITALS — BP 120/70 | HR 90 | Temp 97.6°F | Ht 61.75 in | Wt 202.8 lb

## 2012-10-09 DIAGNOSIS — J4489 Other specified chronic obstructive pulmonary disease: Secondary | ICD-10-CM

## 2012-10-09 DIAGNOSIS — K219 Gastro-esophageal reflux disease without esophagitis: Secondary | ICD-10-CM | POA: Insufficient documentation

## 2012-10-09 DIAGNOSIS — J441 Chronic obstructive pulmonary disease with (acute) exacerbation: Secondary | ICD-10-CM | POA: Diagnosis not present

## 2012-10-09 DIAGNOSIS — E559 Vitamin D deficiency, unspecified: Secondary | ICD-10-CM | POA: Diagnosis not present

## 2012-10-09 DIAGNOSIS — M79671 Pain in right foot: Secondary | ICD-10-CM

## 2012-10-09 DIAGNOSIS — I83893 Varicose veins of bilateral lower extremities with other complications: Secondary | ICD-10-CM | POA: Diagnosis not present

## 2012-10-09 DIAGNOSIS — M79609 Pain in unspecified limb: Secondary | ICD-10-CM | POA: Diagnosis not present

## 2012-10-09 DIAGNOSIS — J449 Chronic obstructive pulmonary disease, unspecified: Secondary | ICD-10-CM | POA: Diagnosis not present

## 2012-10-09 DIAGNOSIS — I8392 Asymptomatic varicose veins of left lower extremity: Secondary | ICD-10-CM | POA: Insufficient documentation

## 2012-10-09 MED ORDER — PREDNISONE 20 MG PO TABS: ORAL_TABLET | ORAL | Status: DC

## 2012-10-09 MED ORDER — TIOTROPIUM BROMIDE MONOHYDRATE 18 MCG IN CAPS: 18.0000 ug | ORAL_CAPSULE | Freq: Every day | RESPIRATORY_TRACT | Status: DC

## 2012-10-09 NOTE — Patient Instructions (Signed)
No clear bacterial infection. Start a prednisone taper. Start spiriva inhaler daily for COPD symptoms long-term. Use proair albuterol inhaler for rescue. Follow up for COPD with Deborra Medina in next 1-2 months, earlier if worsening cough or shortness of breath begins. Elevated both feet above heart, wear compression hose to help with swelling in feet and varicose veins.

## 2012-10-09 NOTE — Progress Notes (Signed)
  Subjective:    Patient ID: Stephanie [name], female    DOB: [birth year], 62 y.o.   MRN: [id number]  HPI 61 year old female pt of Dr. Deborra Medina with history of RLS, anxiety, liver cirrhosis, HTN, borderline diabetes COPD presents with 3 weeks of right foot issues. At the end of the day her right foot is dark black in color. From toes to ankle.  B swelling in ankles at end of day.. Pain in arch of foot. No calf pain. Feels like right foot is colder than left. No known injury.  COPD.Marland Kitchen She has noted deep cough x 1 month. Dry . No SOB, occ wheeze.  Has nasal congestion, no ear pain. No sinus pain, dry throat, no sneeze, no itchy eye watery eyes. No fever. Occ at night when lying down she feel acid come to her throat..like she has to vomit. On prilosec for GERD. Current smoker. No Chest pain. Used proair as needed about daily.  Using cream and salicylic acid on calluses on right foot.Marland Kitchen Has been doing this for 1 month.   Review of Systems  Constitutional: Negative for fever and fatigue.  HENT: Negative for ear pain.   Eyes: Negative for pain.  Respiratory: Positive for wheezing. Negative for chest tightness and shortness of breath.   Cardiovascular: Positive for leg swelling. Negative for chest pain and palpitations.  Gastrointestinal: Negative for abdominal pain.  Genitourinary: Negative for dysuria.       Objective:   Physical Exam  Constitutional: Vital signs are normal. She appears well-developed and well-nourished. She is cooperative.  Non-toxic appearance. She does not appear ill. No distress.  HENT:  Head: Normocephalic.  Right Ear: Hearing, tympanic membrane, external ear and ear canal normal. Tympanic membrane is not erythematous, not retracted and not bulging.  Left Ear: Hearing, tympanic membrane, external ear and ear canal normal. Tympanic membrane is not erythematous, not retracted and not bulging.  Nose: No mucosal edema or rhinorrhea. Right sinus exhibits no maxillary sinus  tenderness and no frontal sinus tenderness. Left sinus exhibits no maxillary sinus tenderness and no frontal sinus tenderness.  Mouth/Throat: Uvula is midline, oropharynx is clear and moist and mucous membranes are normal.  Eyes: Conjunctivae, EOM and lids are normal. Pupils are equal, round, and reactive to light. No foreign bodies found.  Neck: Trachea normal and normal range of motion. Neck supple. Carotid bruit is not present. No mass and no thyromegaly present.  Cardiovascular: Normal rate, regular rhythm, S1 normal, S2 normal, normal heart sounds, intact distal pulses and normal pulses.  Exam reveals no gallop and no friction rub.   No murmur heard. B varicose veins.  No peripheral swelling.  Pulmonary/Chest: Effort normal. Not tachypneic. No respiratory distress. She has decreased breath sounds. She has wheezes. She has no rhonchi. She has no rales.  Abdominal: Soft. Normal appearance and bowel sounds are normal. There is no tenderness.  Neurological: She is alert.  Skin: Skin is warm, dry and intact. No rash noted.  Psychiatric: Her speech is normal and behavior is normal. Judgment and thought content normal. Her mood appears not anxious. Cognition and memory are normal. She does not exhibit a depressed mood.          Assessment & Plan:

## 2012-10-09 NOTE — Assessment & Plan Note (Signed)
No tenderness to palpation. ? Irritation from salicylic acid she has been using. B feet have very stong pulses and are warm.. Doubt claudiaction or arterial issue.  She has very noticeable B varicose veins and hyperpigmentation from hemosiderin staining in B feet, right darker than left.

## 2012-10-09 NOTE — Assessment & Plan Note (Signed)
Start spiriva for poorly controlled COPD.    Follow up with PCP in 1-2 months for re-eval of COPD.

## 2012-10-09 NOTE — Assessment & Plan Note (Addendum)
Likely viral trigger. No clear sign of bacterial infection. Treat with prednsione taper.  USe albuterol prn.

## 2012-10-09 NOTE — Assessment & Plan Note (Addendum)
?   If this is contributing to cough. MAy need to change omeprazole to stronger med if not improving with current course of treatement.

## 2012-10-10 LAB — VITAMIN D 25 HYDROXY (VIT D DEFICIENCY, FRACTURES): Vit D, 25-Hydroxy: 27 ng/mL — ABNORMAL LOW (ref 30–89)

## 2012-10-11 ENCOUNTER — Other Ambulatory Visit: Payer: Self-pay | Admitting: Family Medicine

## 2012-10-11 ENCOUNTER — Other Ambulatory Visit: Payer: Self-pay | Admitting: *Deleted

## 2012-10-11 MED ORDER — ERGOCALCIFEROL 1.25 MG (50000 UT) PO CAPS: 50000.0000 [IU] | ORAL_CAPSULE | ORAL | Status: DC

## 2012-10-12 ENCOUNTER — Other Ambulatory Visit: Payer: Self-pay | Admitting: *Deleted

## 2012-10-12 MED ORDER — ALBUTEROL SULFATE HFA 108 (90 BASE) MCG/ACT IN AERS: INHALATION_SPRAY | RESPIRATORY_TRACT | Status: DC

## 2012-10-17 IMAGING — US US EXTREM LOW VENOUS*R*
1 series · 14 of 24 positions shown · non-contrast
Comparison: none

REASON FOR EXAM: Call Report  [id number]  RLE swelling pain  eval for DVT
COMMENTS:

PROCEDURE:     US  - US DOPPLER LOW EXTR RIGHT  - February 27, 2012  [DATE]
RESULT:     Comparison: None

[Series 1: us extrem low venous*right* · 0.09mm/px · 14 of 29 slices shown]
[im 1/29]
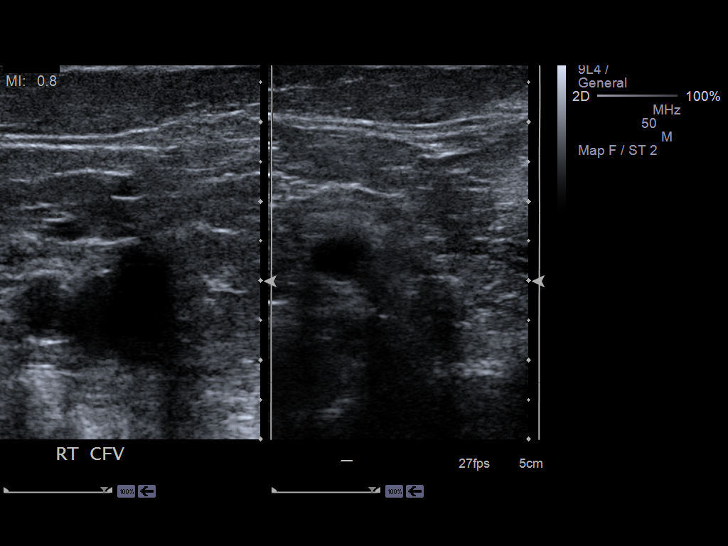
[im 3/29]
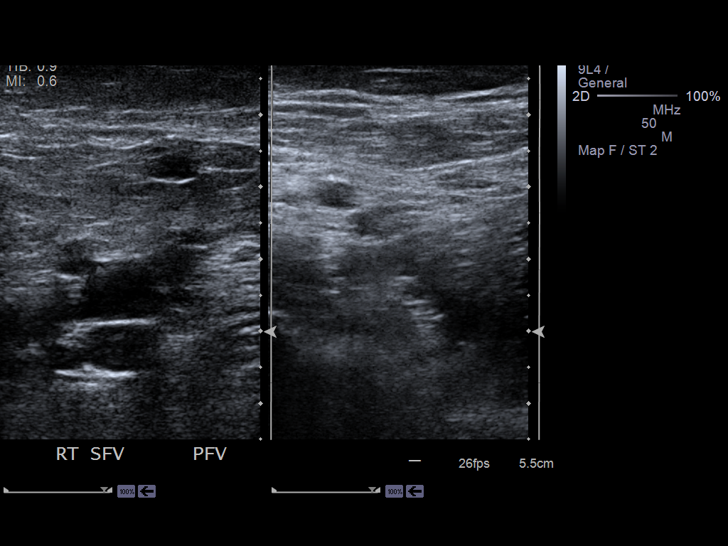
[im 5/29]
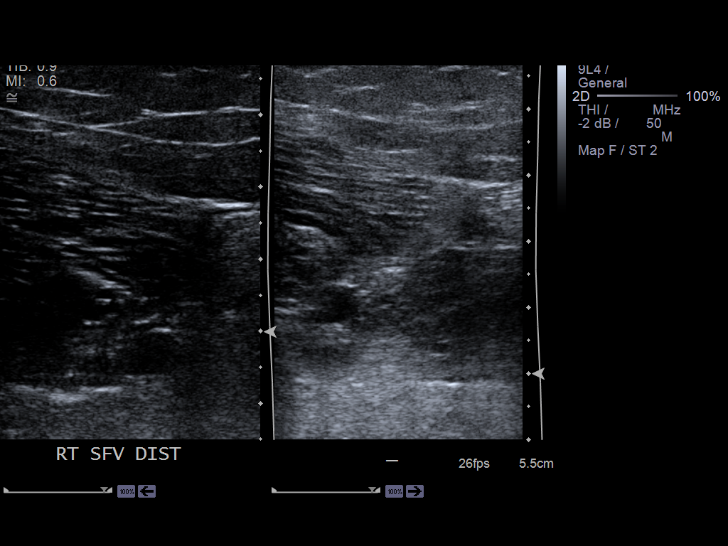
[im 8/29]
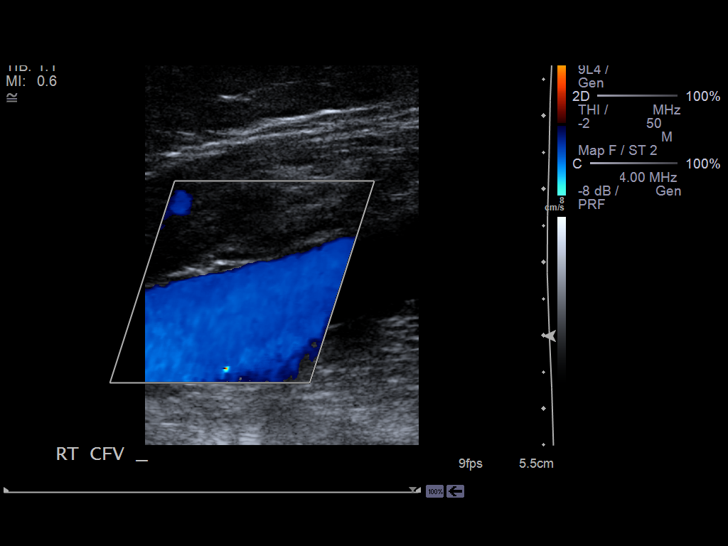
[im 9/29]
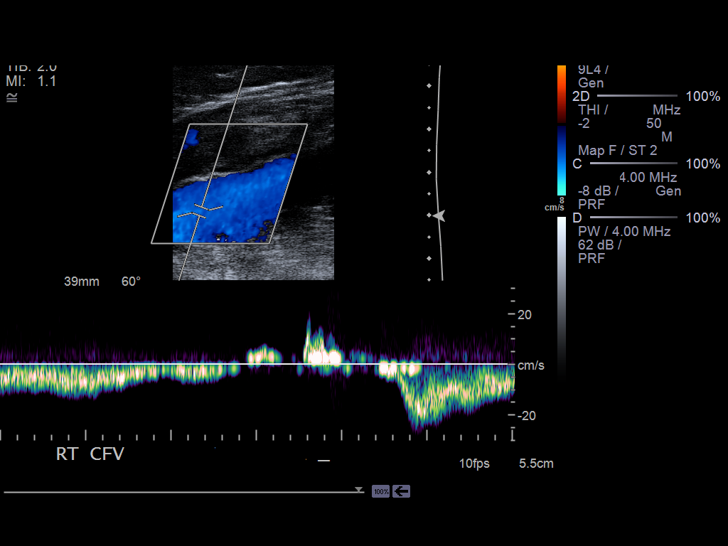
[im 11/29]
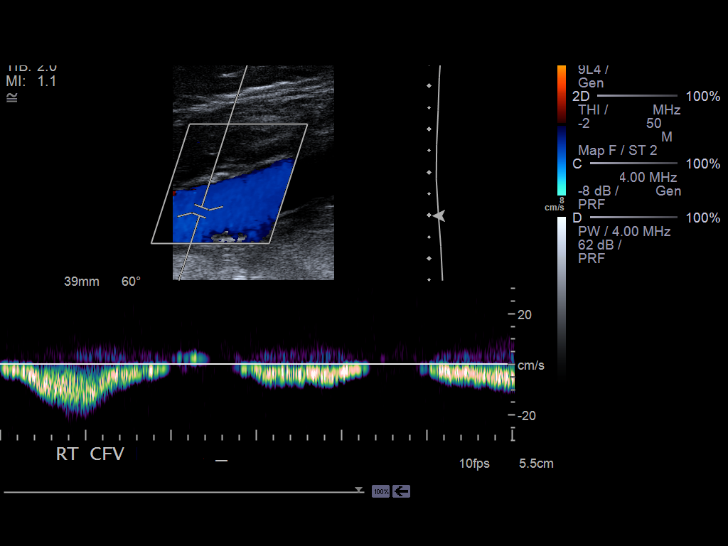
[im 14/29]
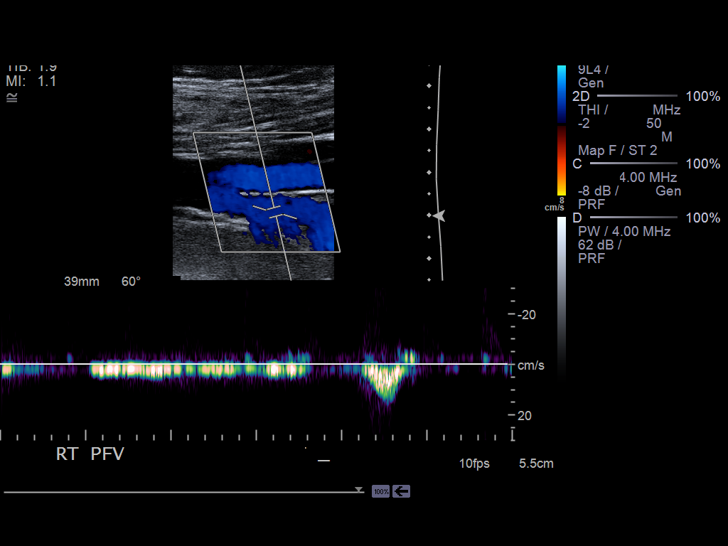
[im 15/29]
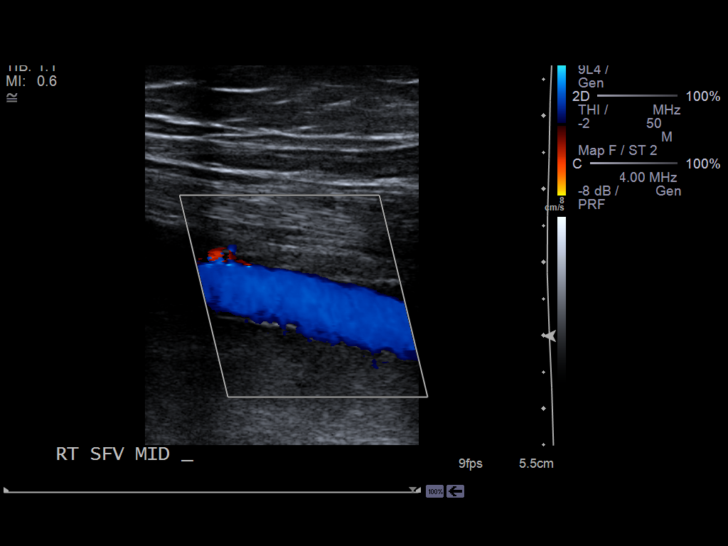
[im 18/29]
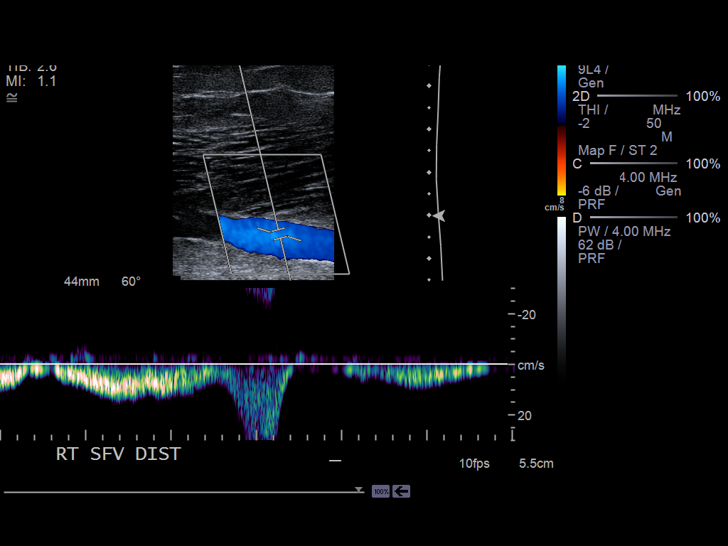
[im 20/29]
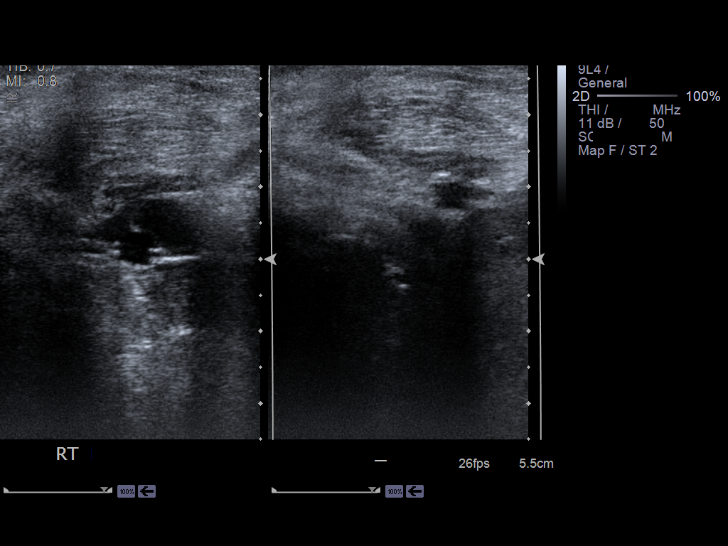
[im 22/29]
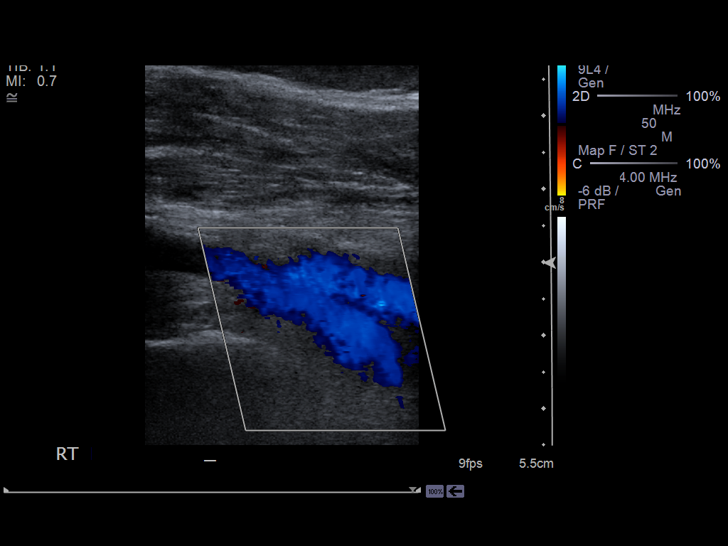
[im 24/29]
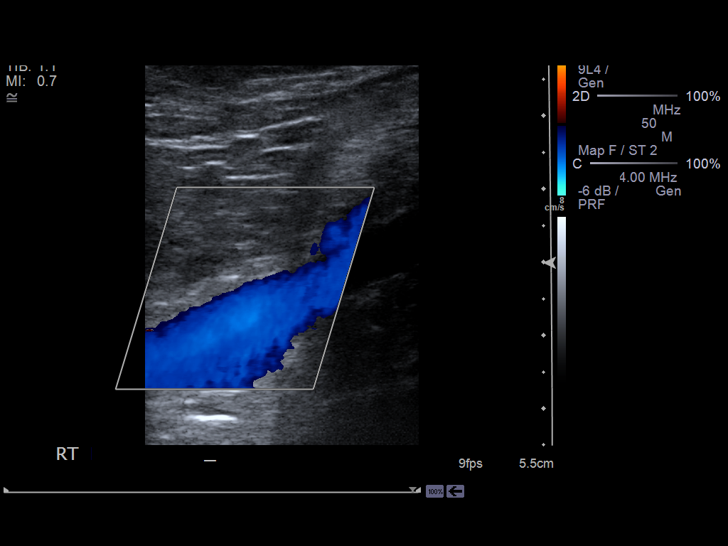
[im 26/29]
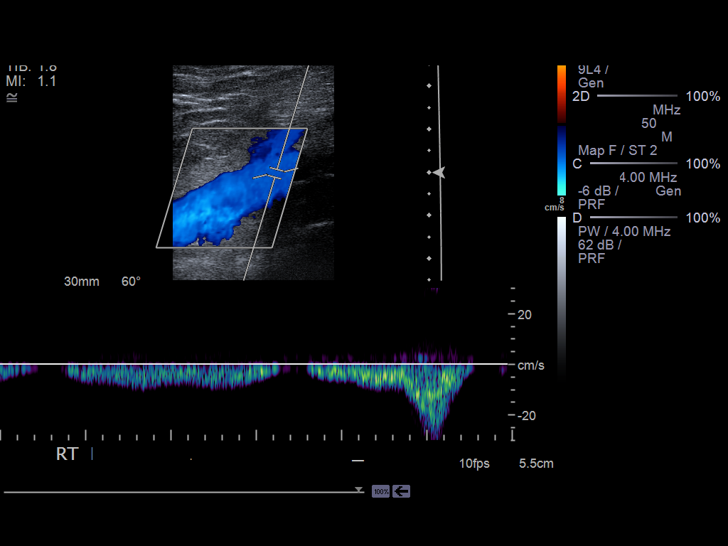
[im 29/29]
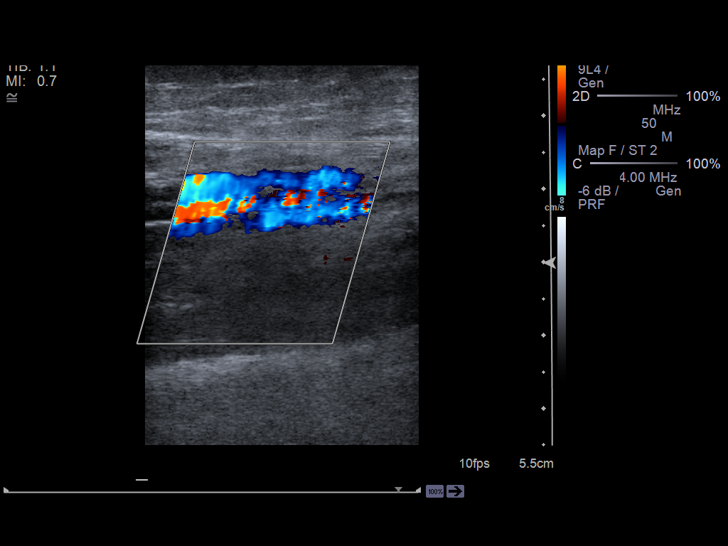

[14 of 24 positions shown; findings below may reference images not displayed]

FINDINGS: Multiple longitudinal and transverse gray-scale as well as color
and spectral Doppler images of the right lower extremity veins were obtained
from the common femoral veins through the popliteal veins.

The right common femoral, greater saphenous, femoral, popliteal veins, and
venous trifurcation are patent, demonstrating normal color-flow and
compressibility. No intraluminal thrombus is identified.There is normal
respiratory variation and augmentation demonstrated at all vein levels.
IMPRESSION: No evidence of DVT in the right lower extremity.

[REDACTED]

## 2012-10-24 ENCOUNTER — Other Ambulatory Visit: Payer: Self-pay | Admitting: Family Medicine

## 2012-10-30 ENCOUNTER — Other Ambulatory Visit: Payer: Self-pay | Admitting: *Deleted

## 2012-10-30 ENCOUNTER — Telehealth: Payer: Self-pay

## 2012-10-30 ENCOUNTER — Other Ambulatory Visit: Payer: Self-pay | Admitting: Family Medicine

## 2012-10-30 MED ORDER — ROPINIROLE HCL 3 MG PO TABS: ORAL_TABLET | ORAL | Status: DC

## 2012-10-30 MED ORDER — FLUOCINONIDE-E 0.05 % EX CREA: TOPICAL_CREAM | Freq: Two times a day (BID) | CUTANEOUS | Status: DC

## 2012-10-30 NOTE — Telephone Encounter (Signed)
Pt had poison oak on both hands for 1 week no weeping but itching; has tried calamine lotion called ivarest and not helping. Pt request med sent to [facility]. Pt request call back.

## 2012-10-30 NOTE — Telephone Encounter (Signed)
Left message on voice mail advising patient.

## 2012-10-30 NOTE — Telephone Encounter (Signed)
[facility] last filled 10/01/12.

## 2012-10-30 NOTE — Telephone Encounter (Signed)
Rx for lidex sent to pharmacy.  Needs to be seen if no improvement in next several days.

## 2012-11-08 ENCOUNTER — Other Ambulatory Visit: Payer: Self-pay | Admitting: Family Medicine

## 2012-11-09 ENCOUNTER — Encounter: Payer: Self-pay | Admitting: Radiology

## 2012-11-12 ENCOUNTER — Ambulatory Visit (INDEPENDENT_AMBULATORY_CARE_PROVIDER_SITE_OTHER): Payer: Medicare Other | Admitting: Family Medicine

## 2012-11-12 ENCOUNTER — Encounter: Payer: Self-pay | Admitting: Family Medicine

## 2012-11-12 ENCOUNTER — Telehealth: Payer: Self-pay | Admitting: *Deleted

## 2012-11-12 VITALS — BP 104/62 | HR 88 | Temp 97.4°F | Wt 202.0 lb

## 2012-11-12 DIAGNOSIS — J449 Chronic obstructive pulmonary disease, unspecified: Secondary | ICD-10-CM | POA: Diagnosis not present

## 2012-11-12 DIAGNOSIS — E559 Vitamin D deficiency, unspecified: Secondary | ICD-10-CM

## 2012-11-12 MED ORDER — ALBUTEROL SULFATE HFA 108 (90 BASE) MCG/ACT IN AERS: INHALATION_SPRAY | RESPIRATORY_TRACT | Status: DC

## 2012-11-12 NOTE — Patient Instructions (Addendum)
Good to see you. Please continue the Spiriva. See if you can cut back a little on your proair inhaler.

## 2012-11-12 NOTE — Telephone Encounter (Signed)
Pt states she was given an override on a refill from her insurance company, so she was able to get the proair.

## 2012-11-12 NOTE — Progress Notes (Signed)
Subjective:    Patient ID: Stephanie [name], female    DOB: 09-03-1950, 62 y.o.   MRN: [id number]  HPI 62 year old female pt  with history of RLS, anxiety, liver cirrhosis, HTN, borderline diabetes, COPD presents for 1 month follow up.   COPD- has not been taking any maintenance inhalers as she felt she had been stable. Had a viral URI last month and saw Dr. Zachery Dauer.  Note reviewed.  She did admit at that time to a deep dry cough x 1 month.  . No SOB, occ wheeze.   On prilosec for GERD. Current smoker.  She also admitted to using proair as needed almost daily.  Dr. B appropriately started her on Spirva.  She is here to follow this up today. She does feel like breathing is better but still using her proair daily- "makes me feel better."   Patient Active Problem List   Diagnosis Date Noted  . Right foot pain 10/09/2012  . Varicose veins of lower extremities with other complications 30/02/2329  . COPD with acute exacerbation 10/09/2012  . GERD (gastroesophageal reflux disease) 10/09/2012  . Callus of foot 09/06/2012  . Chest wall contusion 07/10/2012  . Urinary frequency 07/10/2012  . Cirrhosis of liver 02/02/2012  . Anxiety 07/11/2011  . Aortic dissection   . BACK PAIN, LUMBAR, CHRONIC 11/19/2009  . UNS ADVRS EFF OTH RX MEDICINAL&BIOLOGICAL SBSTNC 12/16/2008  . NAUSEA ALONE 10/23/2008  . UNSPECIFIED VITAMIN D DEFICIENCY 09/26/2008  . TOBACCO ABUSE 06/19/2008  . RESTLESS LEGS SYNDROME 04/27/2007  . INSOMNIA 04/27/2007  . Benign Neoplasm of Colon 04/04/2007  . HYPERLIPIDEMIA 03/08/2007  . OBSTRUCTIVE SLEEP APNEA 03/08/2007  . RHEUMATIC FEVER 03/08/2007  . HYPERTENSION 03/08/2007  . COPD 03/08/2007  . BIPOLAR AFFECTIVE DISORDER, HX OF 03/08/2007   Past Medical History  Diagnosis Date  . Adenomatous colon polyp   . Insomnia   . RLS (restless legs syndrome)   . Colon polyp   . Incontinence   . Hepatitis B, chronic   . H/O: rheumatic fever   . Hyperlipidemia   .  Unspecified disorders of nervous system   . Bipolar affective disorder     h/o  . H/O: CVA (cardiovascular accident)     TIA  . Fatty liver 04/09/08    found in abd CT  . Shortness of breath   . Obstructive sleep apnea     not using CPAP, last sleep study 2.5 years ago, per pt doctor is aware  . Blood transfusion 2000  . GERD (gastroesophageal reflux disease)   . Arthritis   . Aortic dissection     Type 1  . COPD (chronic obstructive pulmonary disease)   . Anxiety   . Depression   . Heart murmur     as a child  . Stroke     TIA- 2002  . Ulcer    Past Surgical History  Procedure Laterality Date  . Back surgery      x 45  . Cholecystectomy    . Rotator cuff repair      left  . Abdominal hysterectomy      total  . Lumbar fusion  10/09  . Hemorroidectomy      and colon polyp removed  . Abdominal exploration surgery    . Posterior cervical fusion/foraminotomy    . Eye surgery      bilat  . Lumbar wound debridement  05/09/2011    Procedure: LUMBAR WOUND DEBRIDEMENT;  Surgeon: Dahlia Bailiff;  Location: [facility];  Service: Orthopedics;  Laterality: N/A;  IRRIGATION AND DEBRIDEMENT SPINAL WOUND  . Axillary artery cannulation via 8-mm hemashield graft, median sternotomy, extracorporeal circulation with deep hypothermic circulatory arrest, repair of  aortic dessection  01/23/2009    Dr Arlyce Dice  . Cataract      both eyes  . Colonoscopy    . Liver biopsy  04/10/2012    Procedure: LIVER BIOPSY;  Surgeon: Inda Castle, MD;  Location: [facility];  Service: Endoscopy;  Laterality: N/A;  ultrasound to mark order for abd limited/liver to be marked to be sent by [email or web address]    History  Substance Use Topics  . Smoking status: Current Every Day Smoker -- 0.50 packs/day    Types: Cigarettes  . Smokeless tobacco: Never Used     Comment: Down to 1/2 a pack   . Alcohol Use: No   Family History  Problem Relation Age of Onset  . Colon cancer Neg Hx   . Esophageal cancer Neg Hx   .  Rectal cancer Neg Hx   . Breast cancer Mother   . Lung cancer Mother   . Stomach cancer Father   . Diabetes      4 aunts, and 1 uncle   No Known Allergies Current Outpatient Prescriptions on File Prior to Visit  Medication Sig Dispense Refill  . ABILIFY 30 MG tablet TAKE 1 TABLET BY MOUTH EVERY MORNING  30 tablet  0  . albuterol (PROAIR HFA) 108 (90 BASE) MCG/ACT inhaler INHALE 2 PUFFS INTO THE LUNGS EVERY 6 (SIX) HOURS AS NEEDED FOR WHEEZING.  8.5 each  3  . albuterol (PROVENTIL HFA;VENTOLIN HFA) 108 (90 BASE) MCG/ACT inhaler INHALE 2 PUFFS INTO THE LUNGS EVERY 6 (SIX) HOURS AS NEEDED FOR WHEEZING.  8.5 each  0  . clonazePAM (KLONOPIN) 0.5 MG tablet TAKE 1 TABLET BY MOUTH TWICE A DAY AS NEEDED FOR ANXIETY  60 tablet  0  . [facility] STOOL SOFTENER 100 MG capsule TAKE 1 CAPSULE BY MOUTH 2 TIMES DAILY AS NEEDED FOR CONSTIPATION  60 capsule  3  . diazepam (VALIUM) 5 MG tablet TAKE 1 TABLET BY MOUTH TWICE A DAY AS NEEDED FOR MUSCLE SPASMS  60 tablet  0  . estradiol (ALORA) 0.1 MG/24HR Place 1 patch (0.1 mg total) onto the skin 2 (two) times a week.  8 patch  12  . fluocinonide-emollient (LIDEX-E) 0.05 % cream Apply topically 2 (two) times daily.  30 g  0  . lamoTRIgine (LAMICTAL) 100 MG tablet Take 3 tablets by mouth once daily      . LIDODERM 5 % PLACE 1 PATCH ONTO THE SKIN DAILY. REMOVE & DISCARD PATCH WITHIN 12 HOURS OR AS DIRECTED BY MD  30 patch  0  . metFORMIN (GLUCOPHAGE-XR) 500 MG 24 hr tablet TAKE 1 TABLET (500 MG TOTAL) BY MOUTH DAILY WITH BREAKFAST.  30 tablet  5  . metFORMIN (GLUCOPHAGE-XR) 500 MG 24 hr tablet TAKE 1 TABLET (500 MG TOTAL) BY MOUTH DAILY WITH BREAKFAST.  30 tablet  5  . mirtazapine (REMERON) 15 MG tablet Take 7.5 mg by mouth at bedtime.       Marland Kitchen nystatin (MYCOSTATIN/NYSTOP) 100000 UNIT/GM POWD APPLY TOPICALLY 2 TIMES DAILY.AS DIRECTED  15 g  0  . omeprazole (PRILOSEC) 40 MG capsule TAKE 1 CAPSULE BY MOUTH DAILY.  90 capsule  1  . oxyCODONE-acetaminophen (PERCOCET/ROXICET)  5-325 MG per tablet Take 1 tablet by mouth every 6 (six) hours as needed.      Marland Kitchen  predniSONE (DELTASONE) 20 MG tablet 3 tabs by mouth daily x 3 days, then 2 tabs by mouth daily x 2 days then 1 tab by mouth daily x 2 days  15 tablet  0  . promethazine (PHENERGAN) 25 MG tablet TAKE 1/2 TO 1 TABLET BY MOUTH EVERY 8 HOURS AS NEEDED FOR NAUSEA  30 tablet  0  . promethazine (PHENERGAN) 25 MG tablet TAKE 1/2 TO 1 TABLET BY MOUTH EVERY 8 HOURS AS NEEDED FOR NAUSEA  30 tablet  0  . QUEtiapine (SEROQUEL) 400 MG tablet       . rOPINIRole (REQUIP) 3 MG tablet TAKE 1 TABLET BY MOUTH AT BEDTIME  30 tablet  0  . salicylic acid 6 % gel APPLY TO AFFECTED AREA EVERY DAY *USE AT NIGHT AND RINSE OFF IN THE MORNING*  40 g  0  . tiotropium (SPIRIVA HANDIHALER) 18 MCG inhalation capsule Place 1 capsule (18 mcg total) into inhaler and inhale daily.  30 capsule  12  . tolterodine (DETROL LA) 2 MG 24 hr capsule TAKE 1 CAPSULE (2 MG TOTAL) BY MOUTH DAILY.  30 capsule  5  . tolterodine (DETROL LA) 2 MG 24 hr capsule Take one capsule by mouth daily  30 capsule  5  . traMADol (ULTRAM) 50 MG tablet TAKE 1 TABLET (50 MG TOTAL) BY MOUTH EVERY 6 (SIX) HOURS AS NEEDED FOR PAIN.  60 tablet  0  . urea (CARMOL) 40 % CREA Apply nightly to feet      . vitamin D, CHOLECALCIFEROL, 400 UNITS tablet Take two by mouth daily  6 tablet  6  . zolpidem (AMBIEN) 10 MG tablet Take 10 mg by mouth at bedtime as needed. For sleep       No current facility-administered medications on file prior to visit.   The PMH, PSH, Social History, Family History, Medications, and allergies have been reviewed in [facility], and have been updated if relevant.    Review of Systems  Constitutional: Negative for fever and fatigue.  HENT: Negative for ear pain.   Eyes: Negative for pain.  Respiratory: Positive for wheezing. Negative for chest tightness and shortness of breath.   Cardiovascular: Positive for leg swelling. Negative for chest pain and palpitations.   Gastrointestinal: Negative for abdominal pain.  Genitourinary: Negative for dysuria.       Objective:   Physical Exam  BP 104/62  Pulse 88  Temp(Src) 97.4 F (36.3 C)  Wt 202 lb (91.627 kg)  BMI 37.27 kg/m2 Wt Readings from Last 3 Encounters:  11/12/12 202 lb (91.627 kg)  10/09/12 202 lb 12 oz (91.967 kg)  09/06/12 207 lb (93.895 kg)     Constitutional: Vital signs are normal. She appears well-developed and well-nourished. She is cooperative.  Non-toxic appearance. She does not appear ill. No distress.  HENT:  Head: Normocephalic.  Right Ear: Hearing, tympanic membrane, external ear and ear canal normal. Tympanic membrane is not erythematous, not retracted and not bulging.  Left Ear: Hearing, tympanic membrane, external ear and ear canal normal. Tympanic membrane is not erythematous, not retracted and not bulging.  Nose: No mucosal edema or rhinorrhea. Right sinus exhibits no maxillary sinus tenderness and no frontal sinus tenderness. Left sinus exhibits no maxillary sinus tenderness and no frontal sinus tenderness.  Mouth/Throat: Uvula is midline, oropharynx is clear and moist and mucous membranes are normal.  Eyes: Conjunctivae, EOM and lids are normal. Pupils are equal, round, and reactive to light. No foreign bodies found.  Neck: Trachea  normal and normal range of motion. Neck supple. Carotid bruit is not present. No mass and no thyromegaly present.  Cardiovascular: Normal rate, regular rhythm, S1 normal, S2 normal, normal heart sounds, intact distal pulses and normal pulses.  Exam reveals no gallop and no friction rub.   Pulmonary/Chest: Effort normal. Not tachypneic. No respiratory distress. She has decreased breath sounds. She has no wheezes.  She has no rhonchi. She has no rales.  Abdominal: Soft. Normal appearance and bowel sounds are normal. There is no tenderness.  Neurological: She is alert.  Skin: Skin is warm, dry and intact. No rash noted.  Psychiatric: Her speech  is normal and behavior is normal. Judgment and thought content normal. Her mood appears not anxious. Cognition and memory are normal. She does not exhibit a depressed mood.      Assessment & Plan:  1. COPD Symptoms improved with Spiriva. She is taking albuterol nightly (at this point, seems more like habit).  I did ask her to try to cut back to see if she truly needs it. The patient indicates understanding of these issues and agrees with the plan. Call or return to clinic prn if these symptoms worsen or fail to improve as anticipated.

## 2012-11-12 NOTE — Telephone Encounter (Signed)
Patient cant afford the proair inhaler that was sent to pharmacy this morning and she is asking if something different can be sent instead.

## 2012-11-12 NOTE — Telephone Encounter (Signed)
Margarita Grizzle, can you see what pharmacist recommend as cheaper option?

## 2012-11-13 ENCOUNTER — Other Ambulatory Visit: Payer: Self-pay | Admitting: Family Medicine

## 2012-11-13 LAB — VITAMIN D 25 HYDROXY (VIT D DEFICIENCY, FRACTURES): Vit D, 25-Hydroxy: 30 ng/mL (ref 30–89)

## 2012-11-14 DIAGNOSIS — Z79899 Other long term (current) drug therapy: Secondary | ICD-10-CM | POA: Diagnosis not present

## 2012-11-14 IMAGING — US US ABDOMEN COMPLETE
1 series · 13 of 25 positions shown · non-contrast
Comparison: CT of the abdomen and pelvis 01/25/2012.

CLINICAL DATA: Cirrhosis.

COMPLETE ABDOMINAL ULTRASOUND

[Series 1: us abdomen complete · 0.42mm/px · 13 of 55 slices shown]
[im 1/55]
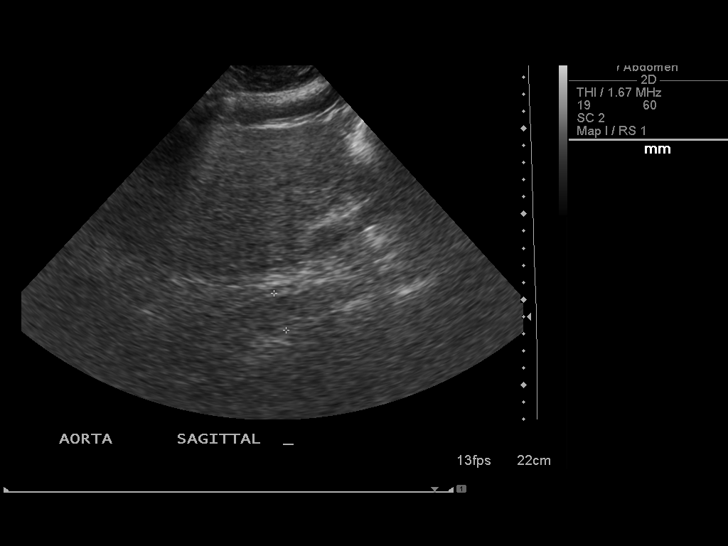
[im 5/55]
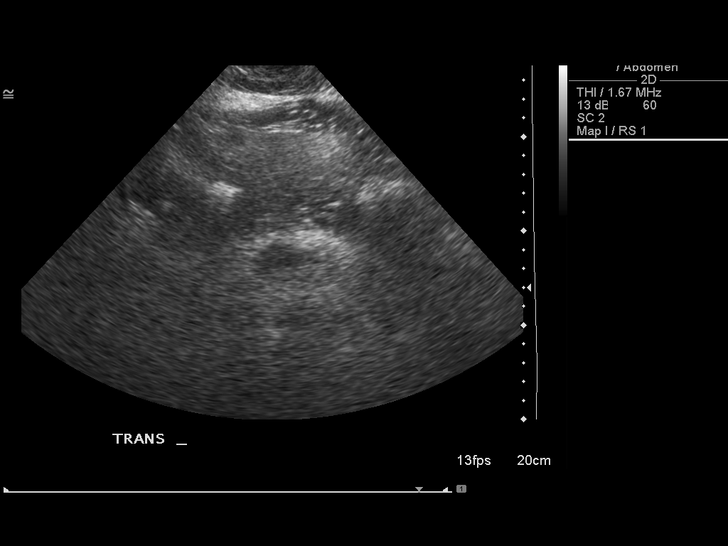
[im 10/55]
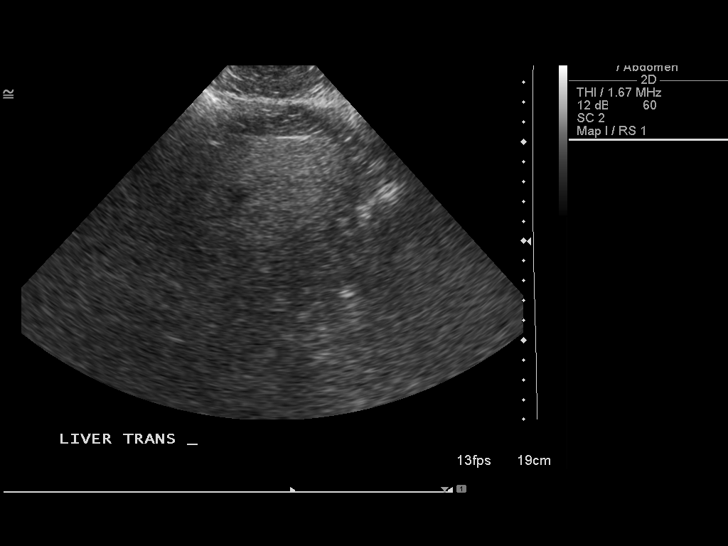
[im 14/55]
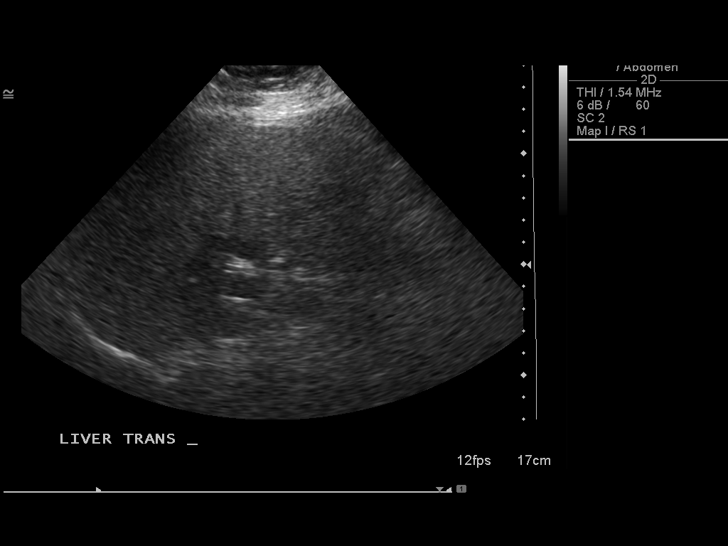
[im 19/55]
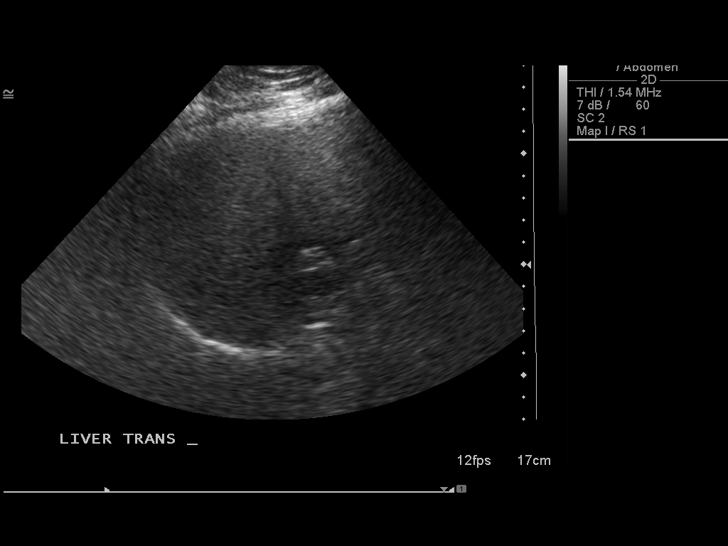
[im 23/55]
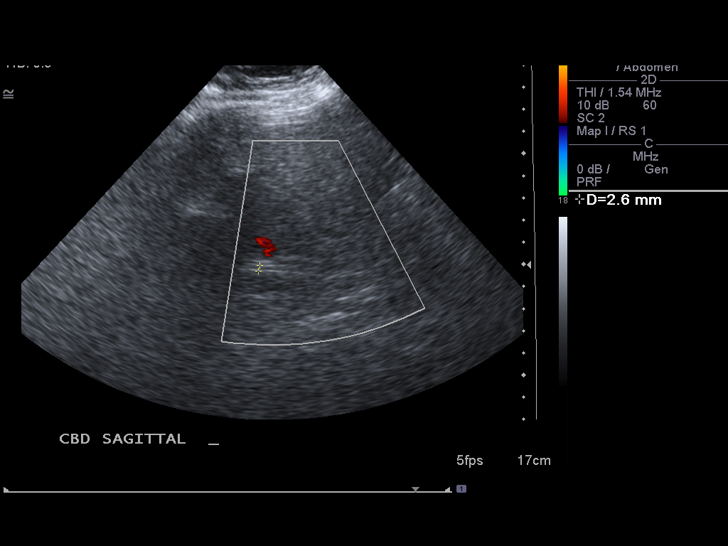
[im 28/55]
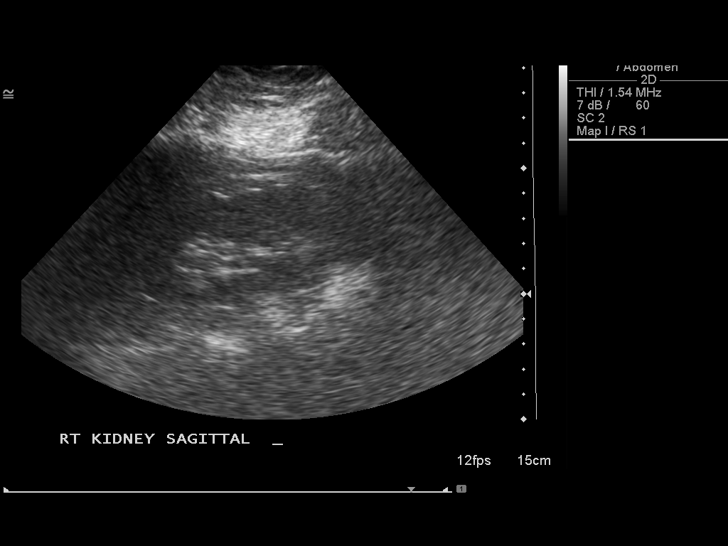
[im 32/55]
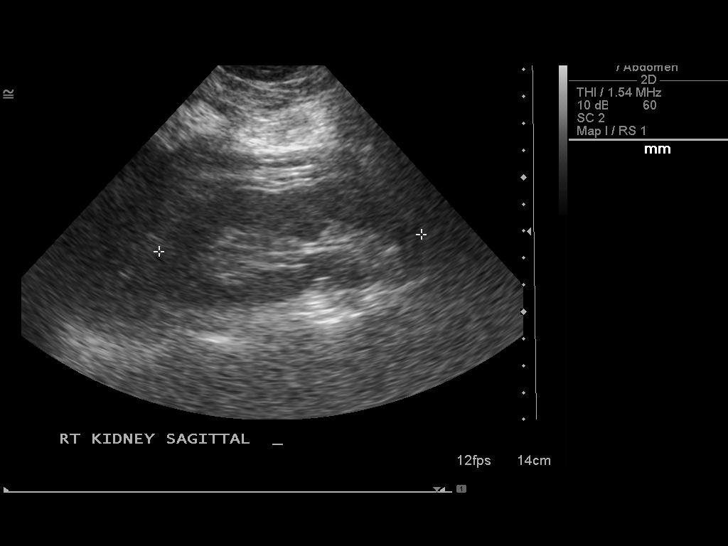
[im 37/55]
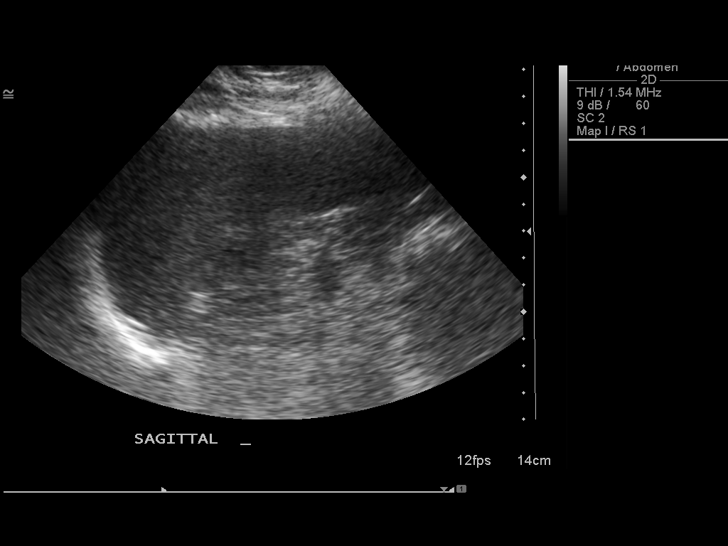
[im 41/55]
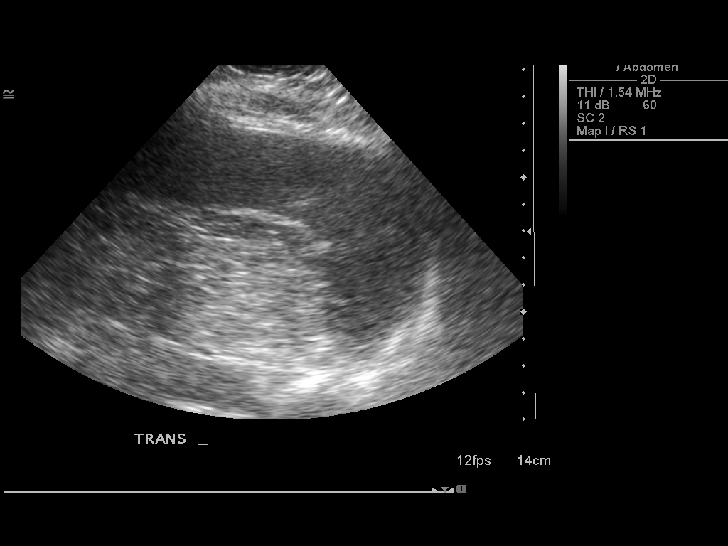
[im 46/55]
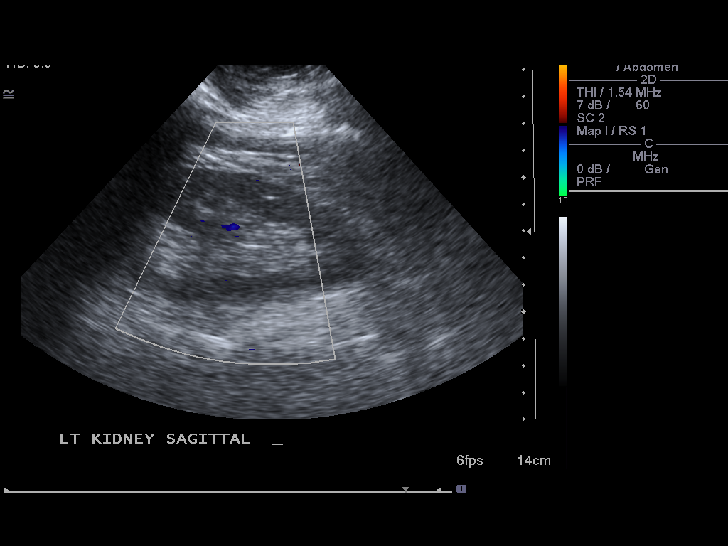
[im 50/55]
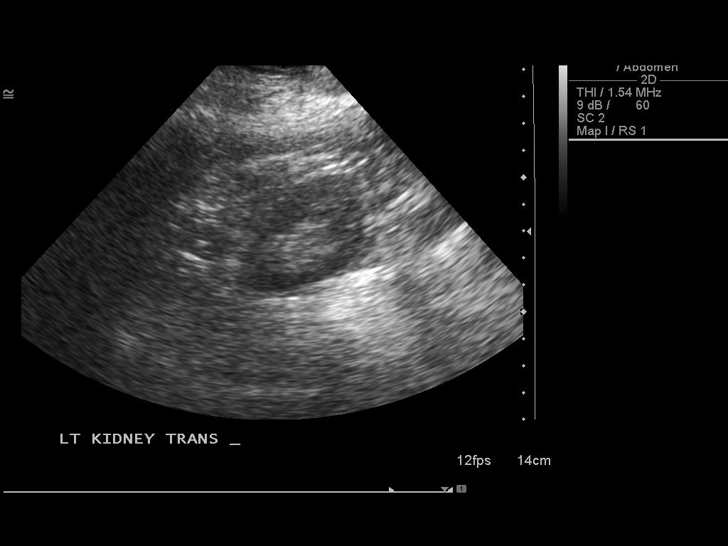
[im 55/55]
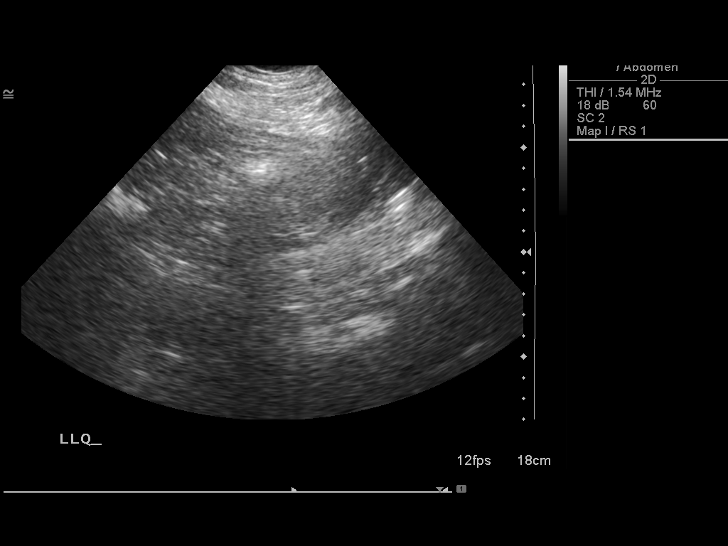

[13 of 25 positions shown; findings below may reference images not displayed]

FINDINGS: Gallbladder:  Status post cholecystectomy.

Common bile duct:  Normal caliber measuring 2.6 mm in the porta
hepatis.

Liver:  Diffusely echogenic and heterogeneous in echotexture, with
irregular contour, compatible with underlying cirrhosis.  No
definite focal cystic or solid hepatic lesions.  No intrahepatic
biliary ductal dilatation.  Normal hepatopetal flow is identified
within the portal vein.

IVC:  Patent throughout its visualized course in the abdomen.

Pancreas:  Although the pancreas is difficult to visualize in its
entirety, no focal pancreatic abnormality is identified.

Spleen:  Normal size and echotexture without focal parenchymal
abnormality.12.8 cm in length.

Right Kidney:  No hydronephrosis.  Well-preserved cortex.  Normal
size and parenchymal echotexture without focal abnormalities.
cm in length.

Left Kidney:  No hydronephrosis.  Well-preserved cortex.  Normal
size and parenchymal echotexture without focal abnormalities.
cm in length.

Abdominal aorta:  Measures up to 2.3 cm in diameter proximally.
The mid and distal aorta could not be visualized secondary to
overlying bowel gas.
IMPRESSION: 1.  The sonographic appearance of the liver suggests cirrhosis, as
above.
2.  Status post cholecystectomy.

## 2012-11-16 ENCOUNTER — Other Ambulatory Visit: Payer: Self-pay | Admitting: Family Medicine

## 2012-11-21 IMAGING — CR DG CHEST 2V
2 series · 2 of 2 positions shown · non-contrast
Comparison: 12/01/2011

CLINICAL DATA: Left flank pain

CHEST - 2 VIEW

[view not recorded (1 of 2)]
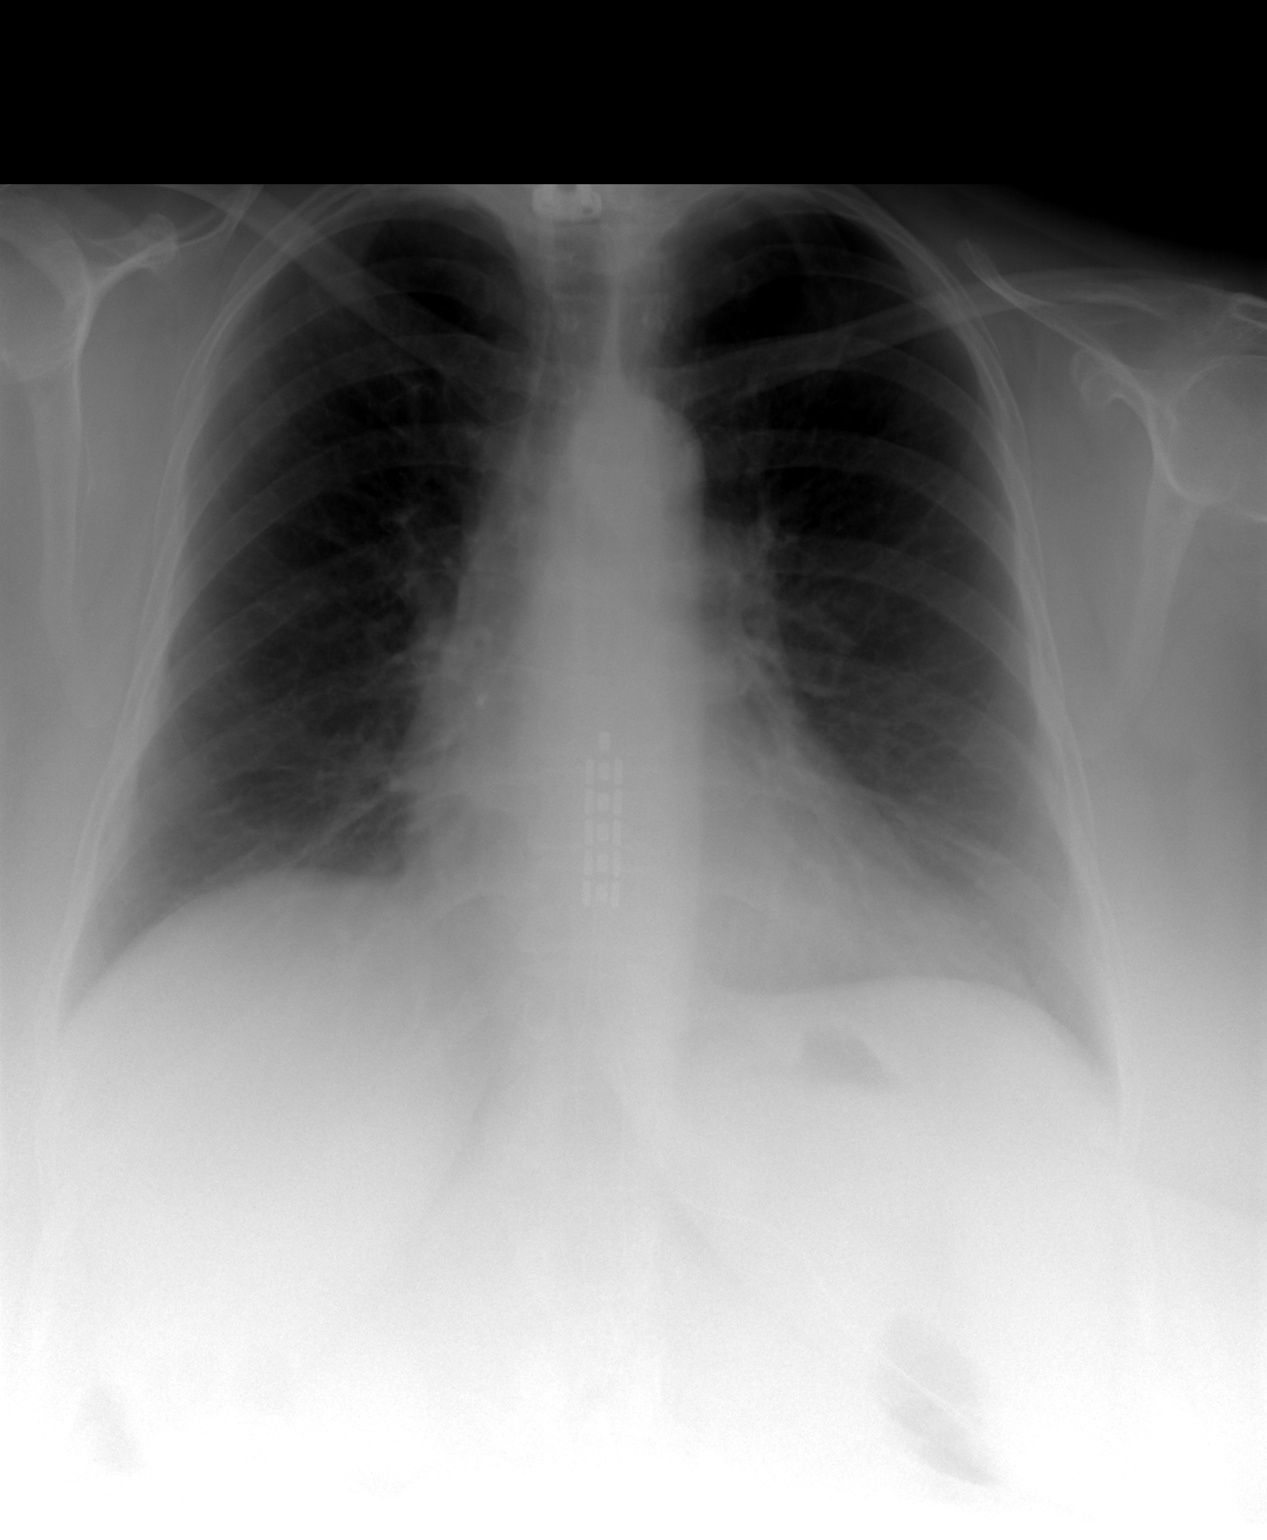

[view not recorded (2 of 2)]
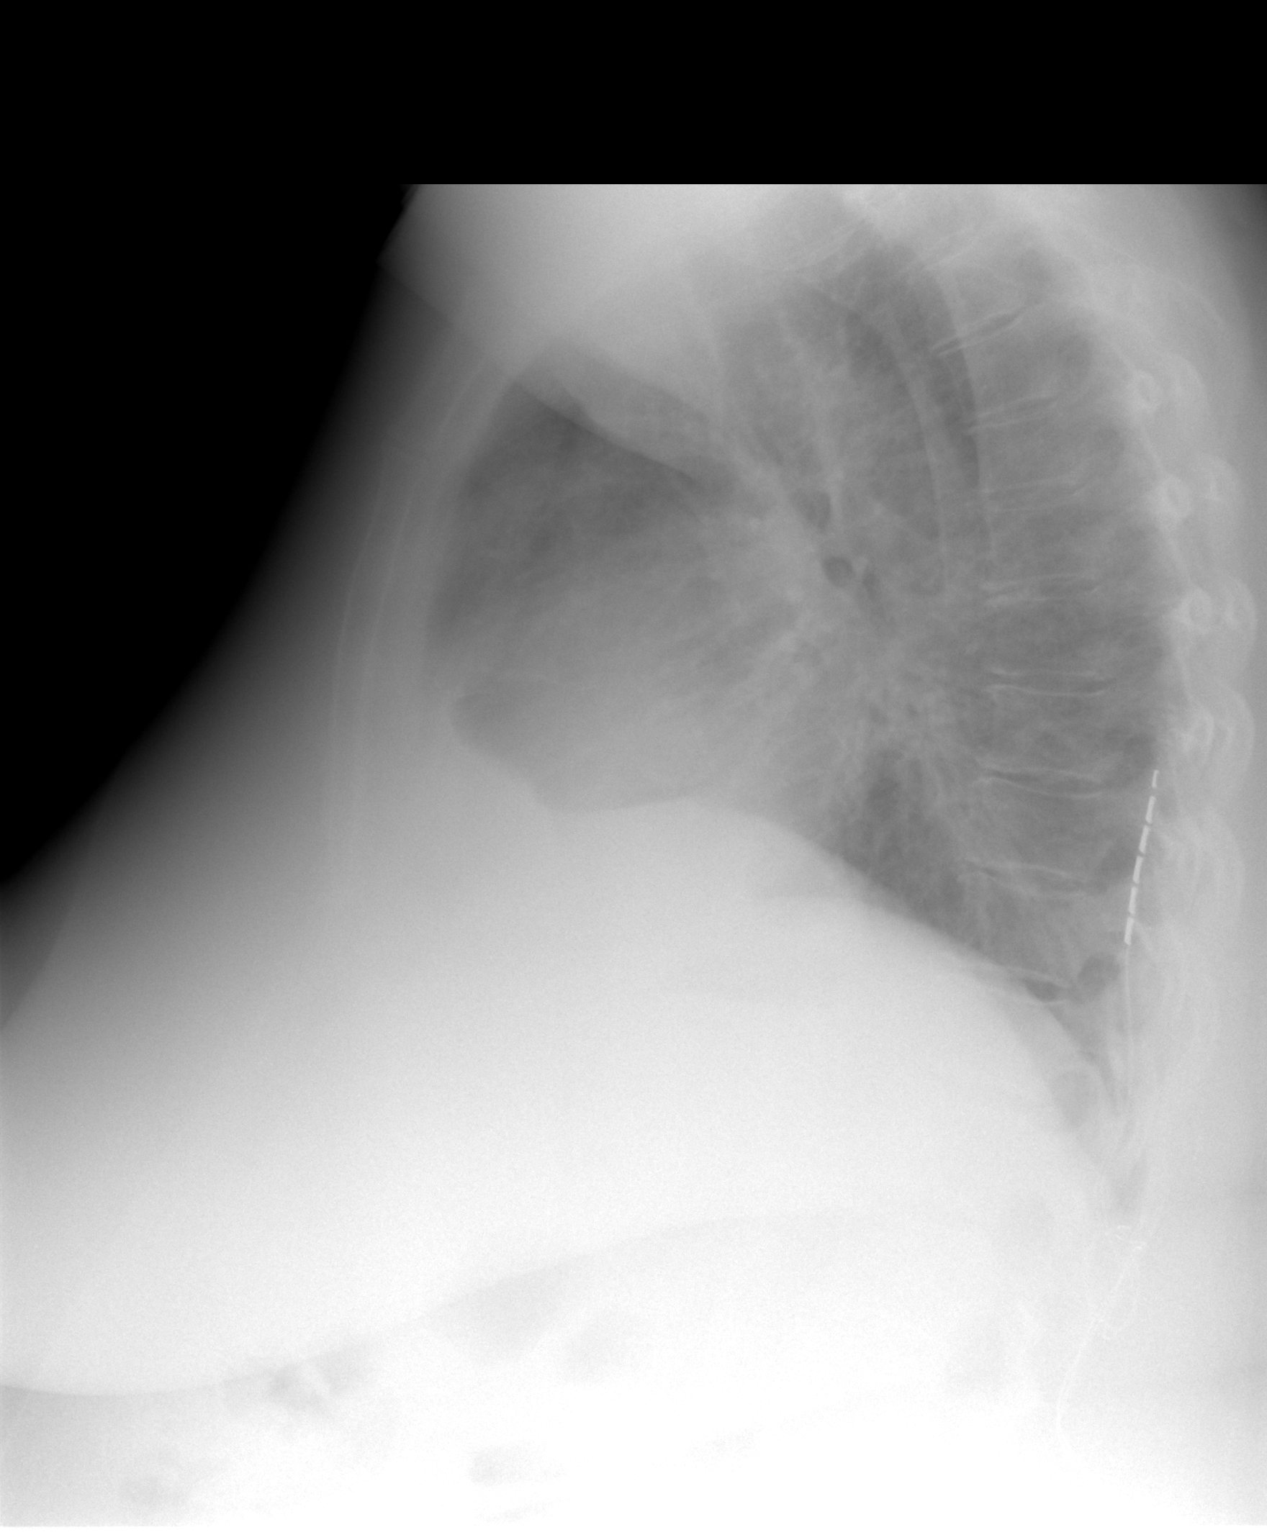

[2 of 2 positions shown; findings below may reference images not displayed]

FINDINGS: Heart size is upper normal.  Negative for heart failure.
Negative for infiltrate or effusion.

Cervical fusion.  Thoracic spinal cord stimulator is unchanged.
IMPRESSION: No acute cardiopulmonary disease.

## 2012-11-26 ENCOUNTER — Other Ambulatory Visit: Payer: Self-pay | Admitting: Family Medicine

## 2012-11-27 ENCOUNTER — Telehealth: Payer: Self-pay

## 2012-11-27 ENCOUNTER — Encounter: Payer: Self-pay | Admitting: Family Medicine

## 2012-11-27 NOTE — Telephone Encounter (Signed)
The cream she has (lidex) will help with inflammation and itching but will not help if there is an infection.  I recommend that she does see someone for this if her symptoms persist.

## 2012-11-27 NOTE — Telephone Encounter (Signed)
[place] at beach and stung by fire ants 8 times on lt leg 4 days ago;now area where stung hurts, red and itching; appears to have pus in 3 of the areas where stung. Pt does not want to schedule appt; can pt use Fluocinonide cream 5 % that pt has. [facility].Please advise.

## 2012-11-27 NOTE — Telephone Encounter (Signed)
Advised patient, she will call back if not getting better.

## 2012-11-29 IMAGING — US US ABDOMEN LIMITED
1 series · 7 of 7 positions shown · non-contrast
Comparison: 03/26/2012.

CLINICAL DATA: Liver biopsy.

LIMITED ABDOMINAL ULTRASOUND

[Series 1: us abdomen limited · 0.30mm/px · 7 of 7 slices shown]
[im 1/7]
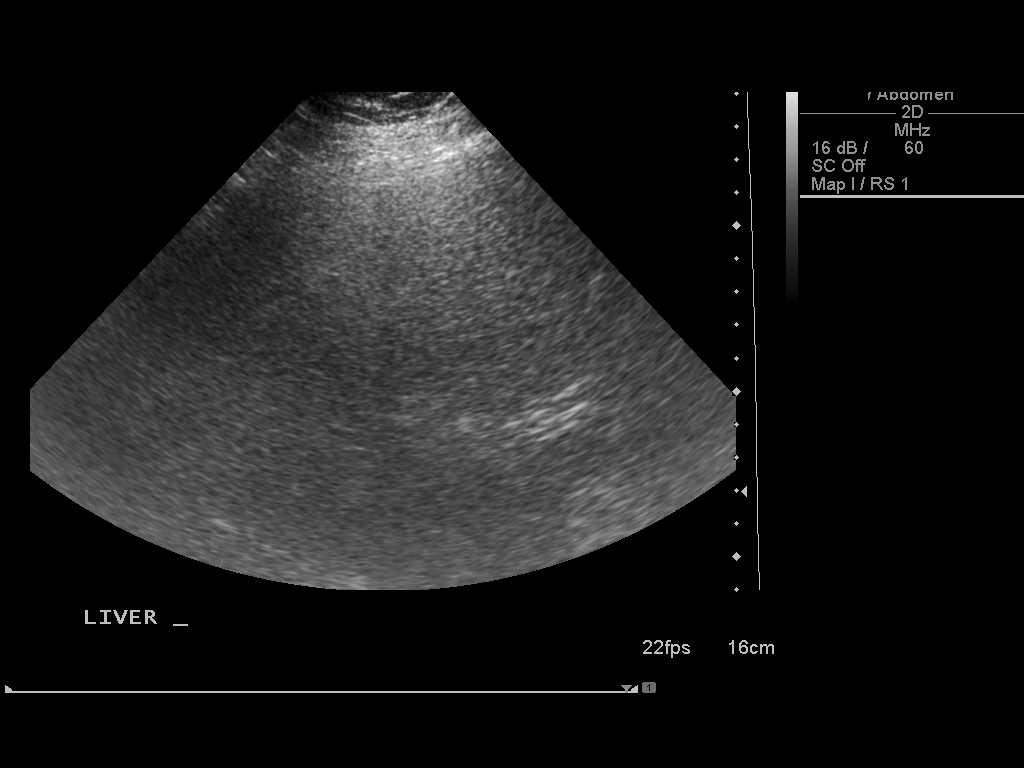
[im 2/7]
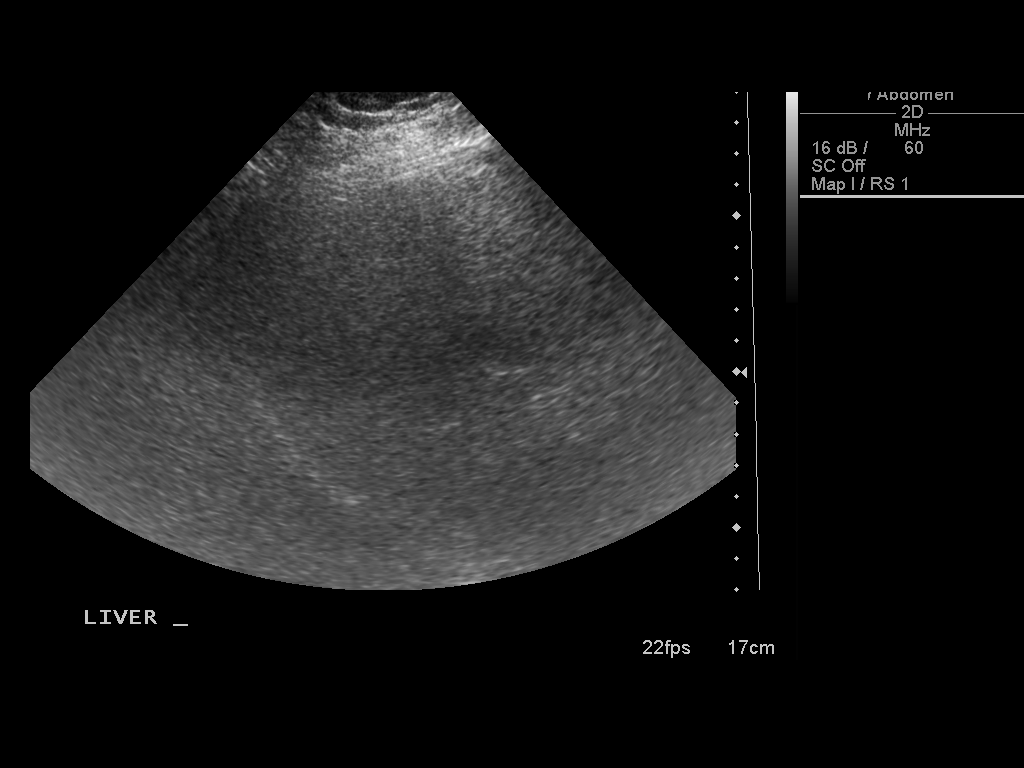
[im 3/7]
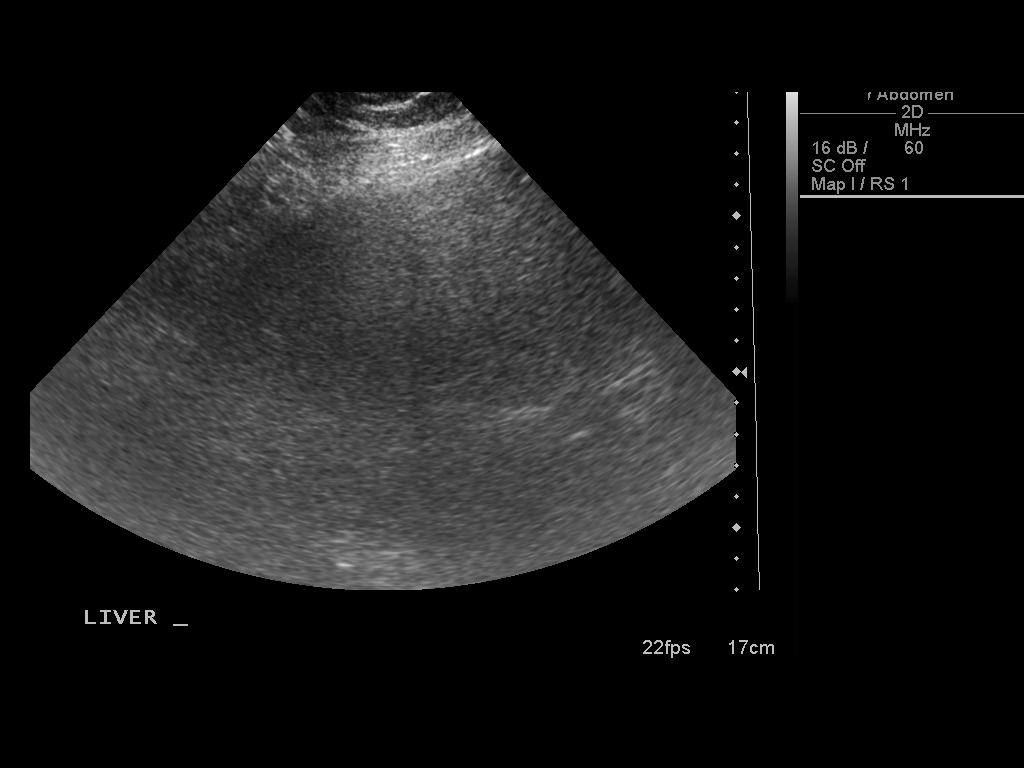
[im 4/7]
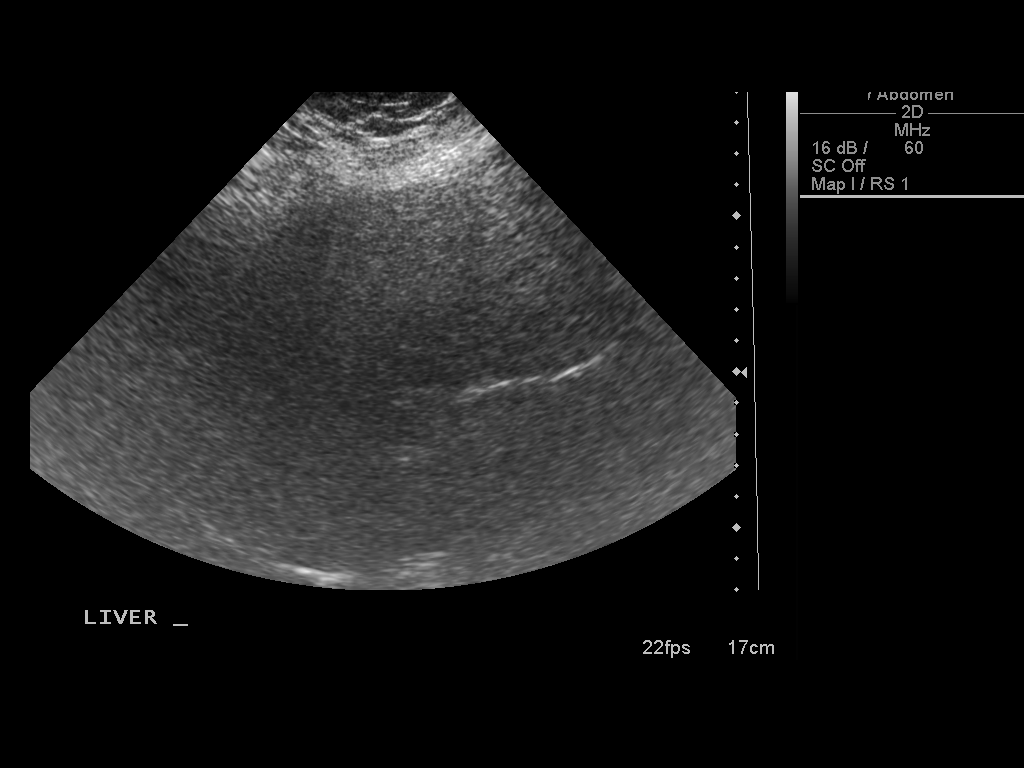
[im 5/7]
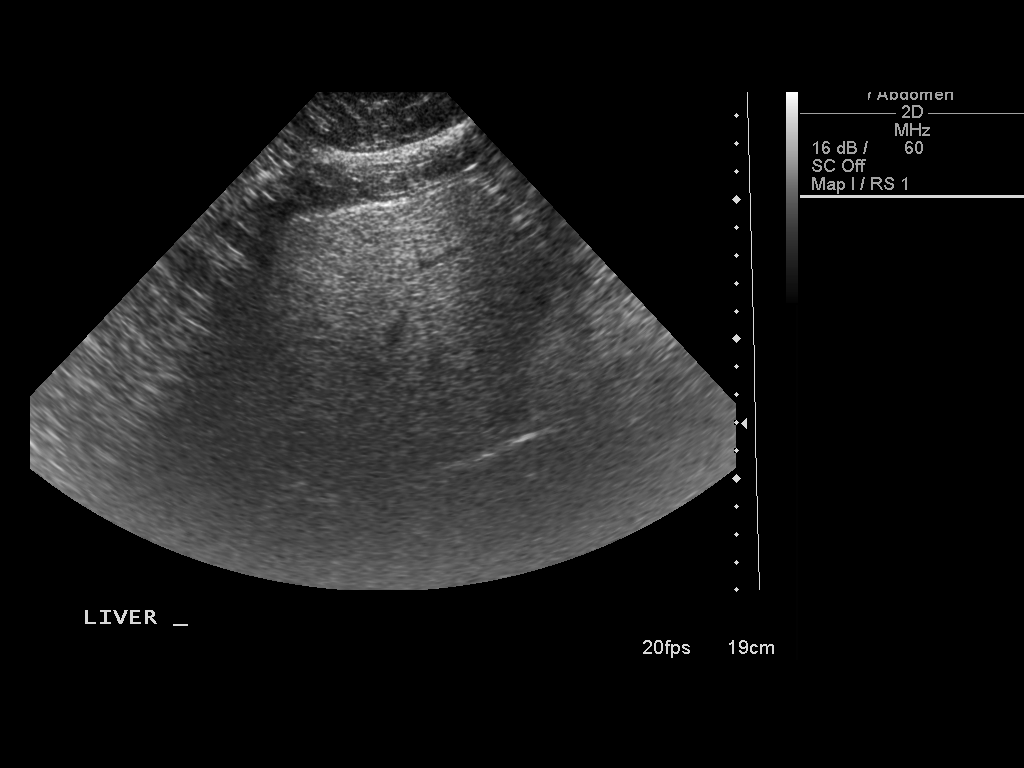
[im 6/7]
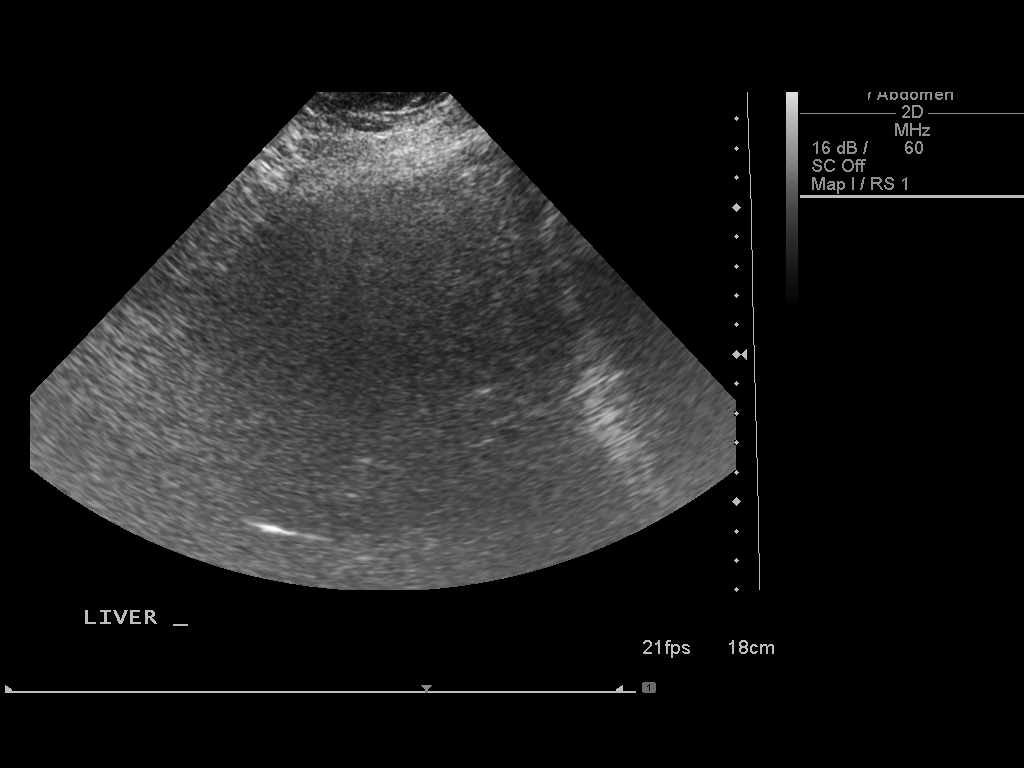
[im 7/7]
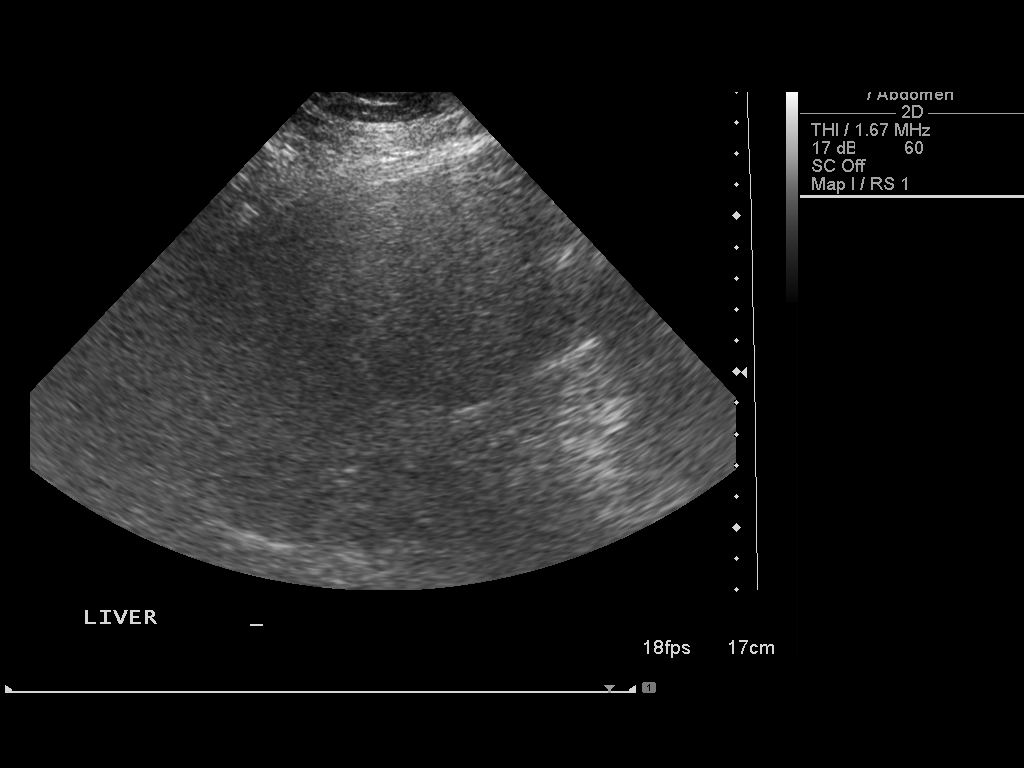

[7 of 7 positions shown; findings below may reference images not displayed]

FINDINGS: Ultrasound was used to mark position for liver biopsy.
No radiology interpretation charge.
IMPRESSION: As above.

## 2012-11-30 ENCOUNTER — Other Ambulatory Visit: Payer: Self-pay | Admitting: Family Medicine

## 2012-12-03 ENCOUNTER — Other Ambulatory Visit: Payer: Self-pay | Admitting: Family Medicine

## 2012-12-03 NOTE — Telephone Encounter (Signed)
[facility]

## 2012-12-07 IMAGING — CR DG THORACIC SPINE 2-3V
1 series · 2 of 2 positions shown · non-contrast
Comparison: none

REASON FOR EXAM: low back pain ,hx of partillay removed spinal cord
stimulator systems myalgias
COMMENTS:

[Series 1: ap · 0.17mm/px · 2 of 2 slices shown]
[im 1/2]
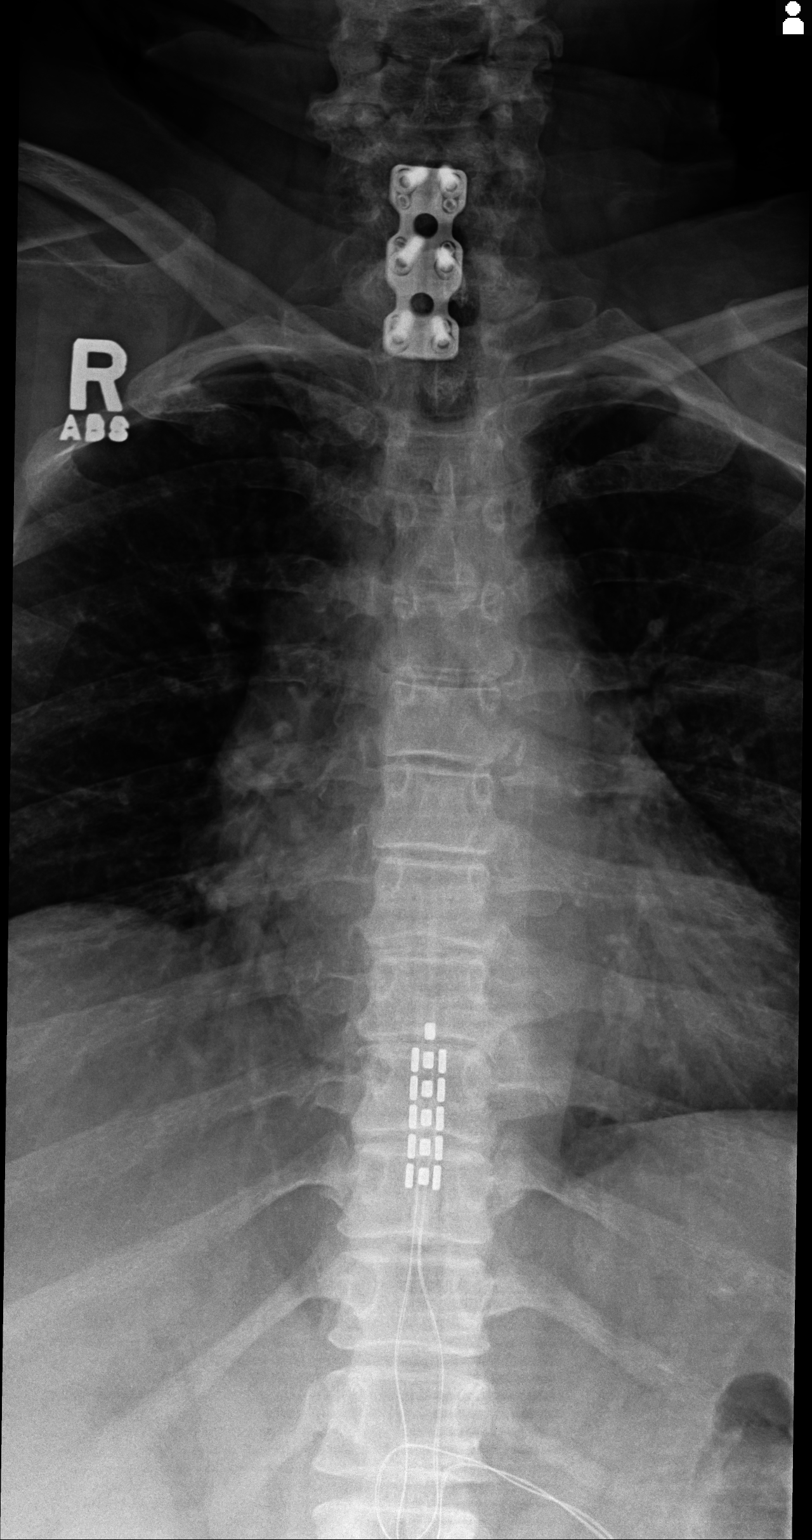
[im 2/2]
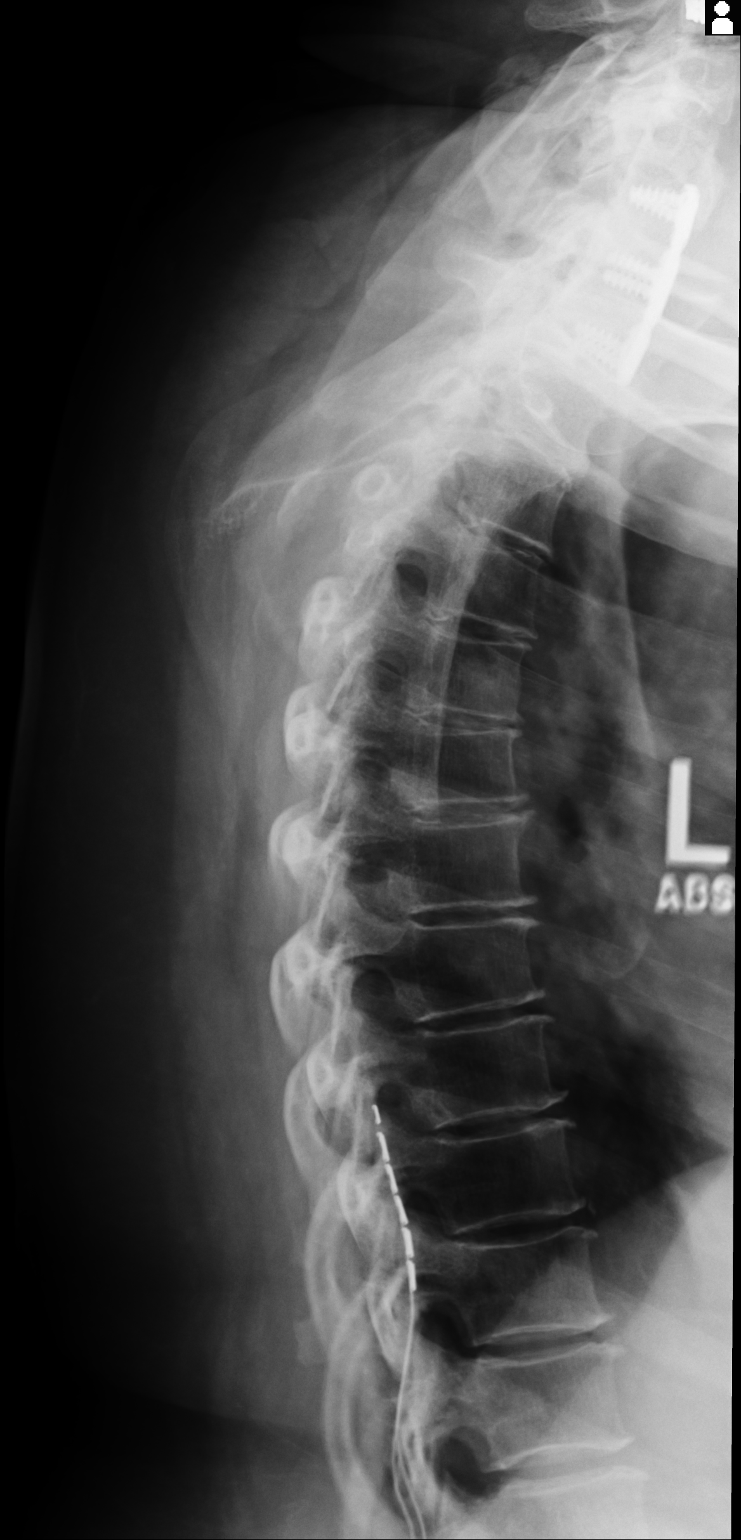

[2 of 2 positions shown; findings below may reference images not displayed]

PROCEDURE:     DXR - DXR THORACIC  AP AND LATERAL  - April 18, 2012 [DATE]

RESULT:     Hardware from previous lower cervical anterior fusion is
present. Thoracic spine images demonstrate electrodes projecting in the
spinal canal with the superior tip at the T8-T9 level. Bony structures
appear unremarkable.
IMPRESSION: Please see above.

[REDACTED]

## 2012-12-07 IMAGING — CR DG LUMBAR SPINE COMPLETE W/ BEND
1 series · 8 of 8 positions shown · non-contrast
Comparison: none

REASON FOR EXAM: low back pain ,hx of partillay removed spinal cord
stimulator systems myalgias
COMMENTS:

[Series 1: ap · 0.17mm/px · 8 of 8 slices shown]
[im 1/8]
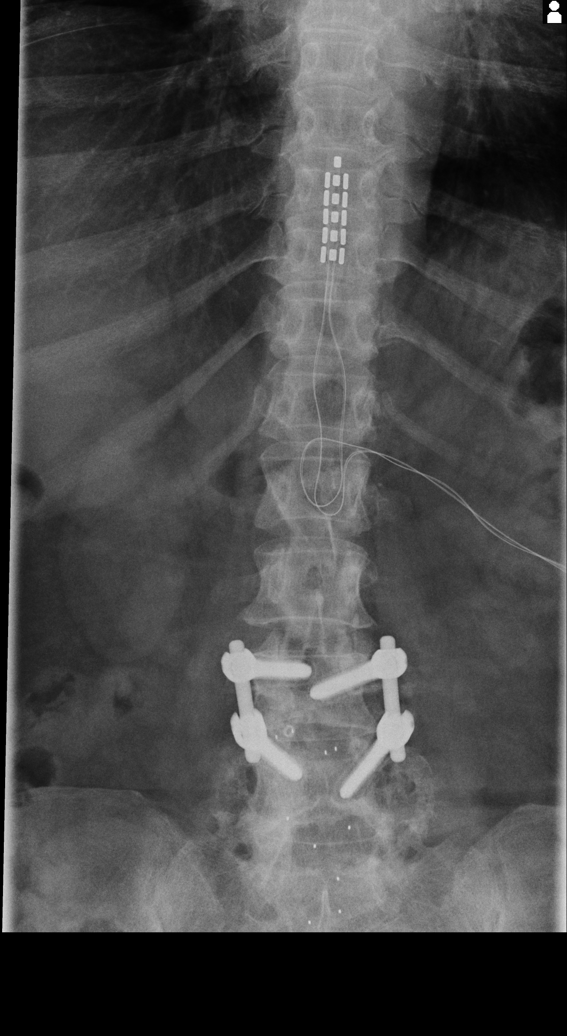
[im 2/8]
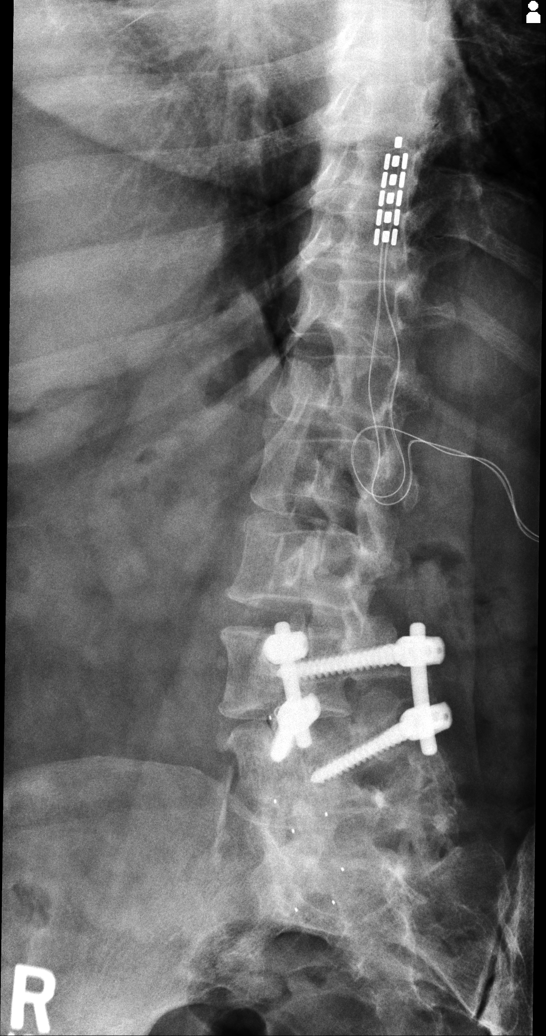
[im 3/8]
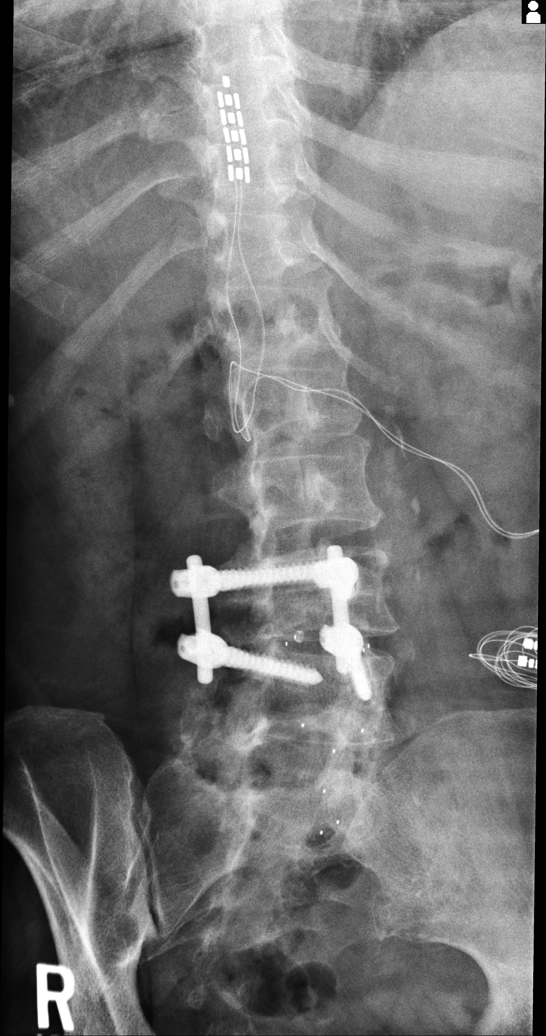
[im 4/8]
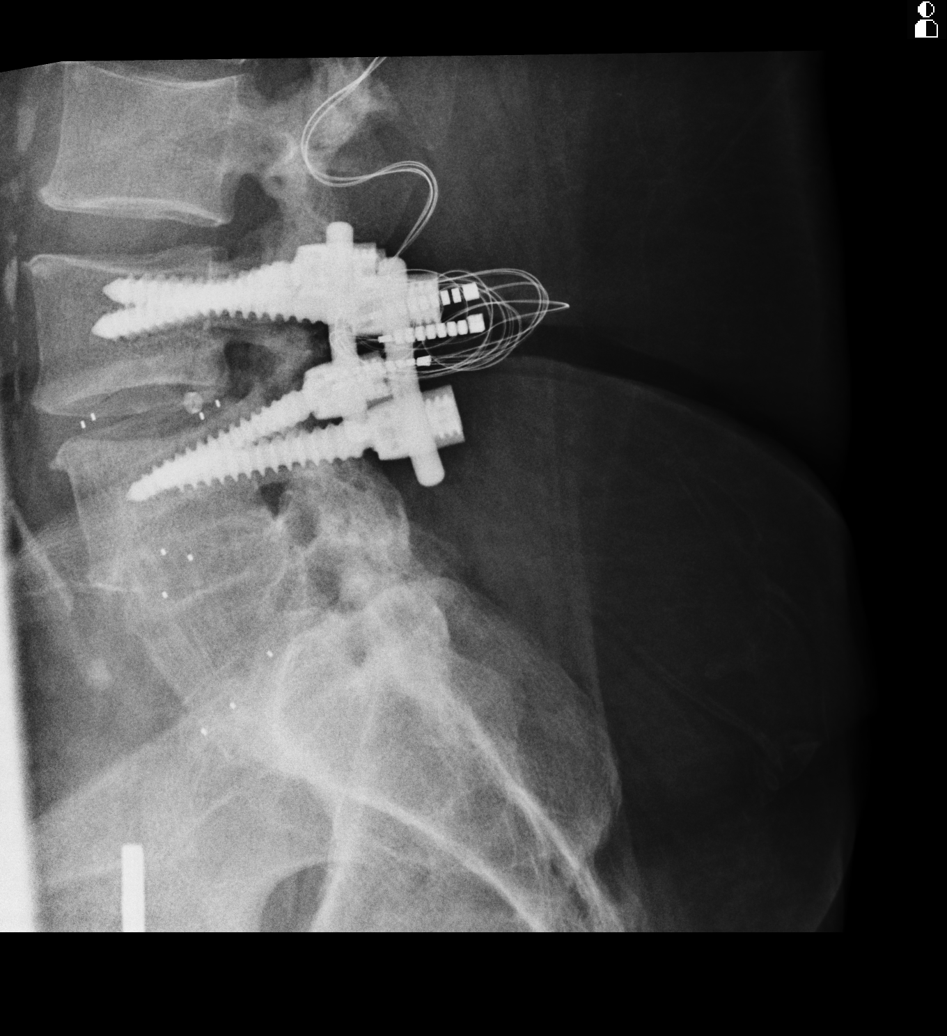
[im 5/8]
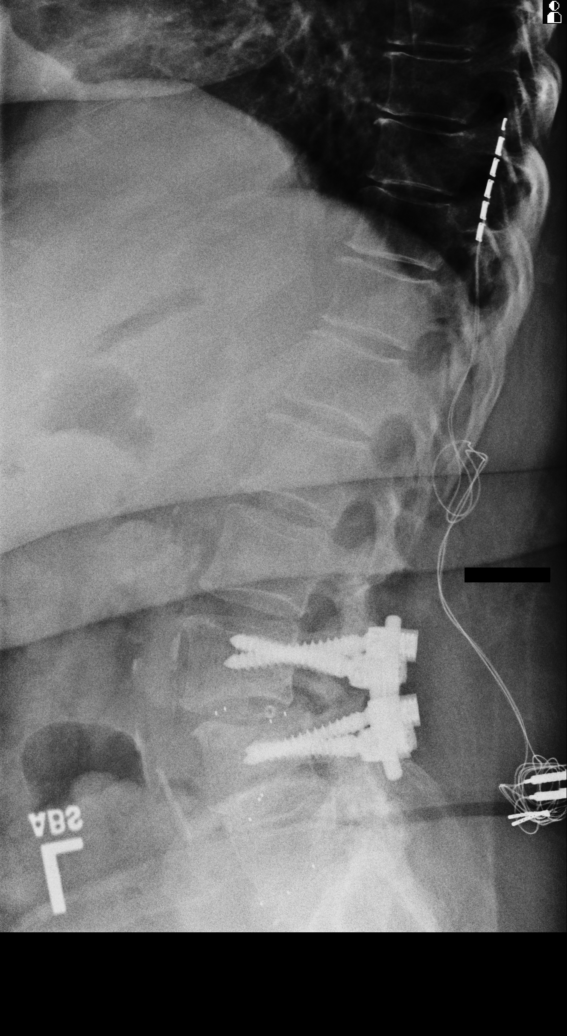
[im 6/8]
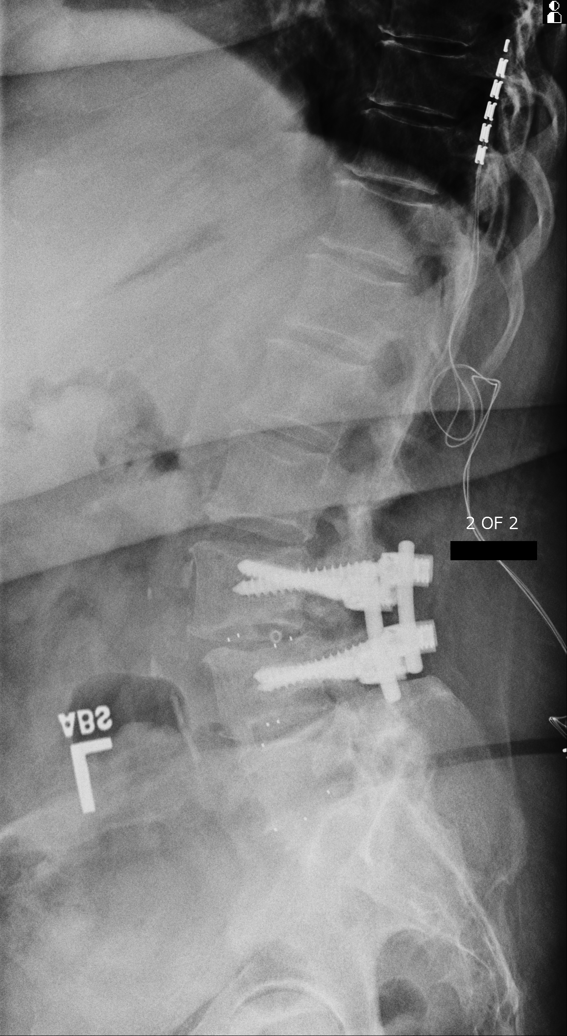
[im 7/8]
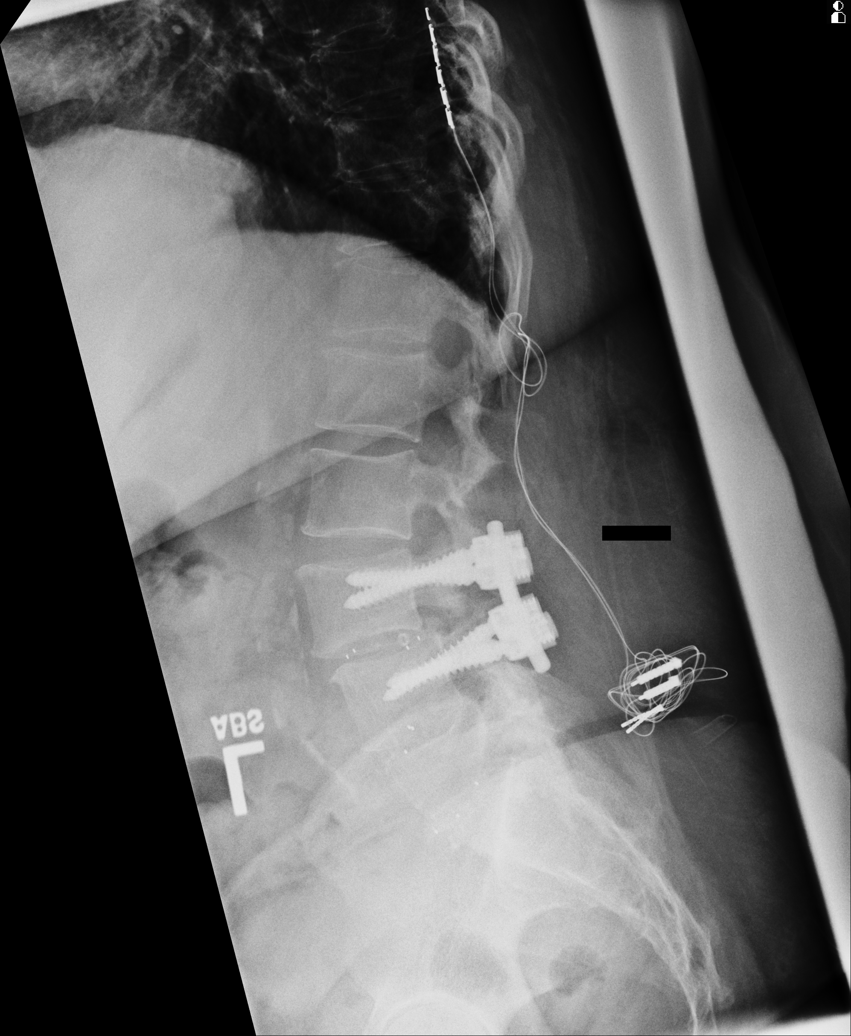
[im 8/8]
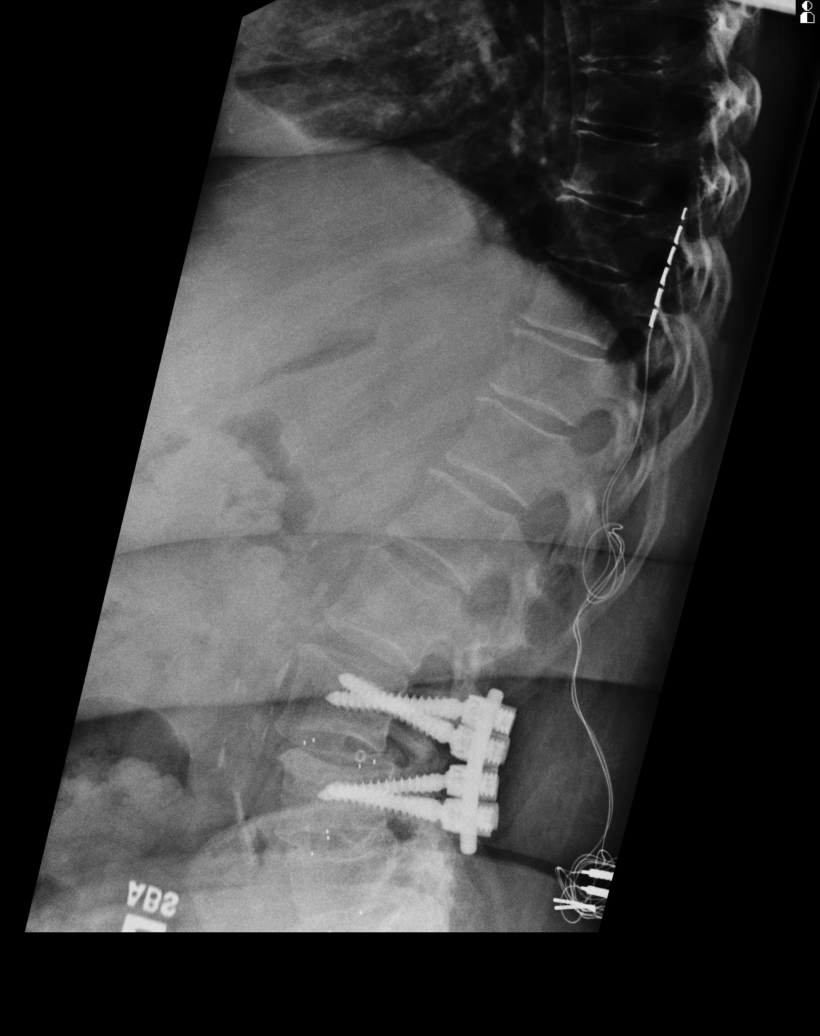

[8 of 8 positions shown; findings below may reference images not displayed]

PROCEDURE:     DXR - DXR LSP COMPLETE W/ FLEX/EXTEN  - April 18, 2012 [DATE]

RESULT:     Previous posterior stabilization changes are noted at L3-L4.
Facet hypertrophy is present. Spinal canal electrodes are seen with the
superior tip at the T8-T9 level. Multilevel facet hypertrophy is present.
There is anterolisthesis of L4 on L5 there appears to be grade 1. L5-S1 disc
space narrowing is present. The hardware appears intact. Flexion and
extension lateral views show no evidence of instability. There is
essentially no flexion and only minimal extension.
IMPRESSION: Please see above.

[REDACTED]

## 2012-12-11 ENCOUNTER — Ambulatory Visit (INDEPENDENT_AMBULATORY_CARE_PROVIDER_SITE_OTHER): Payer: Medicare Other | Admitting: Family Medicine

## 2012-12-11 ENCOUNTER — Telehealth: Payer: Self-pay

## 2012-12-11 ENCOUNTER — Encounter: Payer: Self-pay | Admitting: Family Medicine

## 2012-12-11 ENCOUNTER — Other Ambulatory Visit: Payer: Medicare Other

## 2012-12-11 VITALS — BP 120/80 | HR 96 | Temp 97.8°F | Wt 202.0 lb

## 2012-12-11 DIAGNOSIS — J441 Chronic obstructive pulmonary disease with (acute) exacerbation: Secondary | ICD-10-CM

## 2012-12-11 DIAGNOSIS — I10 Essential (primary) hypertension: Secondary | ICD-10-CM | POA: Diagnosis not present

## 2012-12-11 DIAGNOSIS — J438 Other emphysema: Secondary | ICD-10-CM

## 2012-12-11 DIAGNOSIS — J449 Chronic obstructive pulmonary disease, unspecified: Secondary | ICD-10-CM | POA: Diagnosis not present

## 2012-12-11 DIAGNOSIS — Z1231 Encounter for screening mammogram for malignant neoplasm of breast: Secondary | ICD-10-CM

## 2012-12-11 DIAGNOSIS — E559 Vitamin D deficiency, unspecified: Secondary | ICD-10-CM

## 2012-12-11 MED ORDER — TRAMADOL HCL 50 MG PO TABS: ORAL_TABLET | ORAL | Status: DC

## 2012-12-11 NOTE — Telephone Encounter (Signed)
Pt saw Dr Deborra Medina today and wants our office to schedule screening mammogram at [facility]. Pt cannot go on 12/31/12. If no answer may leave detailed message on [phone].

## 2012-12-11 NOTE — Progress Notes (Signed)
Subjective:    Patient ID: Stephanie [name], female    DOB: March 02, 1951, 62 y.o.   MRN: [id number]  HPI 62 year old female pt  with history of RLS, anxiety, liver cirrhosis, HTN, borderline diabetes, COPD presents for 1 month follow up.   COPD- doing well on Spirva.   She does feel like breathing is better but still using her proair daily- "makes me feel better."  Unfortunately, her son died unexpectedly last month.  She feels she is coping with this ok.  Does need [name] to schedule a mammogram for her.   Patient Active Problem List   Diagnosis Date Noted  . Right foot pain 10/09/2012  . Varicose veins of lower extremities with other complications 73/22/0254  . COPD with acute exacerbation 10/09/2012  . GERD (gastroesophageal reflux disease) 10/09/2012  . Callus of foot 09/06/2012  . Chest wall contusion 07/10/2012  . Urinary frequency 07/10/2012  . Cirrhosis of liver 02/02/2012  . Anxiety 07/11/2011  . Aortic dissection   . BACK PAIN, LUMBAR, CHRONIC 11/19/2009  . UNS ADVRS EFF OTH RX MEDICINAL&BIOLOGICAL SBSTNC 12/16/2008  . NAUSEA ALONE 10/23/2008  . UNSPECIFIED VITAMIN D DEFICIENCY 09/26/2008  . TOBACCO ABUSE 06/19/2008  . RESTLESS LEGS SYNDROME 04/27/2007  . INSOMNIA 04/27/2007  . Benign Neoplasm of Colon 04/04/2007  . HYPERLIPIDEMIA 03/08/2007  . OBSTRUCTIVE SLEEP APNEA 03/08/2007  . RHEUMATIC FEVER 03/08/2007  . HYPERTENSION 03/08/2007  . COPD 03/08/2007  . BIPOLAR AFFECTIVE DISORDER, HX OF 03/08/2007   Past Medical History  Diagnosis Date  . Adenomatous colon polyp   . Insomnia   . RLS (restless legs syndrome)   . Colon polyp   . Incontinence   . Hepatitis B, chronic   . H/O: rheumatic fever   . Hyperlipidemia   . Unspecified disorders of nervous system   . Bipolar affective disorder     h/o  . H/O: CVA (cardiovascular accident)     TIA  . Fatty liver 04/09/08    found in abd CT  . Shortness of breath   . Obstructive sleep apnea     not using CPAP, last  sleep study 2.5 years ago, per pt doctor is aware  . Blood transfusion 2000  . GERD (gastroesophageal reflux disease)   . Arthritis   . Aortic dissection     Type 1  . COPD (chronic obstructive pulmonary disease)   . Anxiety   . Depression   . Heart murmur     as a child  . Stroke     TIA- 2002  . Ulcer    Past Surgical History  Procedure Laterality Date  . Back surgery      x 45  . Cholecystectomy    . Rotator cuff repair      left  . Abdominal hysterectomy      total  . Lumbar fusion  10/09  . Hemorroidectomy      and colon polyp removed  . Abdominal exploration surgery    . Posterior cervical fusion/foraminotomy    . Eye surgery      bilat  . Lumbar wound debridement  05/09/2011    Procedure: LUMBAR WOUND DEBRIDEMENT;  Surgeon: Dahlia Bailiff;  Location: [facility];  Service: Orthopedics;  Laterality: N/A;  IRRIGATION AND DEBRIDEMENT SPINAL WOUND  . Axillary artery cannulation via 8-mm hemashield graft, median sternotomy, extracorporeal circulation with deep hypothermic circulatory arrest, repair of  aortic dessection  01/23/2009    Dr Arlyce Dice  . Cataract  both eyes  . Colonoscopy    . Liver biopsy  04/10/2012    Procedure: LIVER BIOPSY;  Surgeon: Inda Castle, MD;  Location: [facility];  Service: Endoscopy;  Laterality: N/A;  ultrasound to mark order for abd limited/liver to be marked to be sent by [email or web address]    History  Substance Use Topics  . Smoking status: Current Every Day Smoker -- 0.50 packs/day    Types: Cigarettes  . Smokeless tobacco: Never Used     Comment: Down to 1/2 a pack   . Alcohol Use: No   Family History  Problem Relation Age of Onset  . Colon cancer Neg Hx   . Esophageal cancer Neg Hx   . Rectal cancer Neg Hx   . Breast cancer Mother   . Lung cancer Mother   . Stomach cancer Father   . Diabetes      4 aunts, and 1 uncle   No Known Allergies Current Outpatient Prescriptions on File Prior to Visit  Medication Sig Dispense Refill   . ABILIFY 30 MG tablet TAKE 1 TABLET BY MOUTH EVERY MORNING  30 tablet  0  . albuterol (PROAIR HFA) 108 (90 BASE) MCG/ACT inhaler INHALE 2 PUFFS INTO THE LUNGS EVERY 6 (SIX) HOURS AS NEEDED FOR WHEEZING.  8.5 each  3  . albuterol (PROVENTIL HFA;VENTOLIN HFA) 108 (90 BASE) MCG/ACT inhaler INHALE 2 PUFFS INTO THE LUNGS EVERY 6 (SIX) HOURS AS NEEDED FOR WHEEZING.  8.5 each  0  . clonazePAM (KLONOPIN) 0.5 MG tablet TAKE 1 TABLET BY MOUTH TWICE A DAY AS NEEDED FOR ANXIETY  60 tablet  0  . [facility] STOOL SOFTENER 100 MG capsule TAKE 1 CAPSULE BY MOUTH 2 TIMES DAILY AS NEEDED FOR CONSTIPATION  60 capsule  3  . diazepam (VALIUM) 5 MG tablet TAKE 1 TABLET BY MOUTH TWICE A DAY AS NEEDED FOR MUSCLE SPASMS  60 tablet  0  . estradiol (ALORA) 0.1 MG/24HR Place 1 patch (0.1 mg total) onto the skin 2 (two) times a week.  8 patch  12  . fluocinonide-emollient (LIDEX-E) 0.05 % cream Apply topically 2 (two) times daily.  30 g  0  . lamoTRIgine (LAMICTAL) 100 MG tablet Take 3 tablets by mouth once daily      . LIDODERM 5 % PLACE 1 PATCH ONTO THE SKIN DAILY. REMOVE & DISCARD PATCH WITHIN 12 HOURS OR AS DIRECTED BY MD  30 patch  0  . metFORMIN (GLUCOPHAGE-XR) 500 MG 24 hr tablet TAKE 1 TABLET (500 MG TOTAL) BY MOUTH DAILY WITH BREAKFAST.  30 tablet  5  . mirtazapine (REMERON) 15 MG tablet Take 7.5 mg by mouth at bedtime.       Marland Kitchen nystatin (MYCOSTATIN/NYSTOP) 100000 UNIT/GM POWD APPLY TOPICALLY 2 TIMES DAILY AS DIRECTED  15 g  0  . omeprazole (PRILOSEC) 40 MG capsule TAKE 1 CAPSULE BY MOUTH DAILY.  90 capsule  1  . promethazine (PHENERGAN) 25 MG tablet TAKE 1/2 TO 1 TABLET BY MOUTH EVERY 8 HOURS AS NEEDED FOR NAUSEA  30 tablet  0  . QUEtiapine (SEROQUEL) 400 MG tablet       . rOPINIRole (REQUIP) 3 MG tablet TAKE 1 TABLET BY MOUTH AT BEDTIME  30 tablet  0  . salicylic acid 6 % gel APPLY TO AFFECTED AREA EVERY DAY *USE AT NIGHT AND RINSE OFF IN THE MORNING*  40 g  0  . tiotropium (SPIRIVA HANDIHALER) 18 MCG inhalation capsule  Place 1 capsule (18 mcg total) into  inhaler and inhale daily.  30 capsule  12  . tolterodine (DETROL LA) 2 MG 24 hr capsule Take one capsule by mouth daily  30 capsule  5  . urea (CARMOL) 40 % CREA Apply nightly to feet      . vitamin D, CHOLECALCIFEROL, 400 UNITS tablet Take two by mouth daily  6 tablet  6  . zolpidem (AMBIEN) 10 MG tablet Take 10 mg by mouth at bedtime as needed. For sleep       No current facility-administered medications on file prior to visit.   The PMH, PSH, Social History, Family History, Medications, and allergies have been reviewed in [facility], and have been updated if relevant.    Review of Systems  See HPI Denies any SI or HI + wheezes No fevers or SOB      Objective:   Physical Exam  BP 120/80  Pulse 96  Temp(Src) 97.8 F (36.6 C)  Wt 202 lb (91.627 kg)  BMI 37.27 kg/m2 Wt Readings from Last 3 Encounters:  12/11/12 202 lb (91.627 kg)  11/12/12 202 lb (91.627 kg)  10/09/12 202 lb 12 oz (91.967 kg)     Constitutional: Vital signs are normal. She appears well-developed and well-nourished. She is cooperative.  Non-toxic appearance. She does not appear ill. No distress.  HENT:  Head: Normocephalic.  Right Ear: Hearing, tympanic membrane, external ear and ear canal normal. Tympanic membrane is not erythematous, not retracted and not bulging.  Left Ear: Hearing, tympanic membrane, external ear and ear canal normal. Tympanic membrane is not erythematous, not retracted and not bulging.  Nose: No mucosal edema or rhinorrhea. Right sinus exhibits no maxillary sinus tenderness and no frontal sinus tenderness. Left sinus exhibits no maxillary sinus tenderness and no frontal sinus tenderness.  Mouth/Throat: Uvula is midline, oropharynx is clear and moist and mucous membranes are normal.  Eyes: Conjunctivae, EOM and lids are normal. Pupils are equal, round, and reactive to light. No foreign bodies found.  Neck: Trachea normal and normal range of motion. Neck  supple. Carotid bruit is not present. No mass and no thyromegaly present.  Cardiovascular: Normal rate, regular rhythm, S1 normal, S2 normal, normal heart sounds, intact distal pulses and normal pulses.  Exam reveals no gallop and no friction rub.   Pulmonary/Chest: Effort normal. Not tachypneic. No respiratory distress. She has decreased breath sounds. She has no wheezes.  She has no rhonchi. She has no rales.  Abdominal: Soft. Normal appearance and bowel sounds are normal. There is no tenderness.  Neurological: She is alert.  Skin: Skin is warm, dry and intact. No rash noted.  Psychiatric: Her speech is normal and behavior is normal. Judgment and thought content normal. Her mood appears not anxious. Cognition and memory are normal. She does not exhibit a depressed mood.      Assessment & Plan:  1. COPD Symptoms improved with Spiriva. She is taking albuterol nightly despite asking her to cut back.  She feels she really needs this.  Call or return to clinic prn if these symptoms worsen or fail to improve as anticipated.

## 2012-12-13 ENCOUNTER — Encounter: Payer: Self-pay | Admitting: Gastroenterology

## 2012-12-18 ENCOUNTER — Encounter: Payer: Self-pay | Admitting: Gastroenterology

## 2012-12-20 ENCOUNTER — Other Ambulatory Visit: Payer: Self-pay | Admitting: Family Medicine

## 2012-12-21 ENCOUNTER — Other Ambulatory Visit: Payer: Self-pay | Admitting: Family Medicine

## 2012-12-26 ENCOUNTER — Other Ambulatory Visit: Payer: Self-pay | Admitting: Family Medicine

## 2012-12-26 ENCOUNTER — Telehealth: Payer: Self-pay

## 2012-12-26 NOTE — Telephone Encounter (Signed)
Pt said 4 months ago fell and rt hip, groin and leg pain still hurting. Pt has seen Dr Rolena Infante before at [facility] but when called his office was told pt needs referral.Please advise.

## 2012-12-26 NOTE — Telephone Encounter (Signed)
Can she see Dr. Lorelei Pont for this first?  He is very good.

## 2012-12-26 NOTE — Telephone Encounter (Signed)
Appt scheduled with Dr.Copland on 01/02/13.

## 2012-12-31 DIAGNOSIS — F312 Bipolar disorder, current episode manic severe with psychotic features: Secondary | ICD-10-CM | POA: Diagnosis not present

## 2013-01-02 ENCOUNTER — Ambulatory Visit (INDEPENDENT_AMBULATORY_CARE_PROVIDER_SITE_OTHER): Payer: Medicare Other | Admitting: Family Medicine

## 2013-01-02 ENCOUNTER — Ambulatory Visit (INDEPENDENT_AMBULATORY_CARE_PROVIDER_SITE_OTHER)
Admission: RE | Admit: 2013-01-02 | Discharge: 2013-01-02 | Disposition: A | Discharge status: Home / Self Care | Payer: Medicare Other | Source: Ambulatory Visit | Attending: Family Medicine | Admitting: Family Medicine

## 2013-01-02 VITALS — BP 102/64 | HR 92 | Temp 98.1°F | Wt 200.5 lb

## 2013-01-02 DIAGNOSIS — M545 Low back pain, unspecified: Secondary | ICD-10-CM

## 2013-01-02 DIAGNOSIS — M961 Postlaminectomy syndrome, not elsewhere classified: Secondary | ICD-10-CM

## 2013-01-02 NOTE — Progress Notes (Signed)
Therapist, music at [facility] [place] [address] Phone: [phone] Fax: [phone]  Date:  01/02/2013   Name:  Stephanie [name]   DOB:  07-31-1950   MRN:  [id number] Gender: female Age: 62 y.o.  Primary Physician:  [name], MD  Evaluating MD: Owens Loffler, MD   Chief Complaint: Hip Pain and Leg Pain   History of Present Illness:  Stephanie [name] is a 66 y.o. pleasant patient who presents with the following:  S/p 5 back surgeries, also had surgery near sacrum in lower back. Made disc from donor bank.   Has been like this with significant pain for a few weeks.  Has seen Max Cohen x 3 years ago, then saw Dr. Rolena Infante again.  Dr. Rolena Infante did her most recent spine surgery.   [place] 3 times in the last 3 months, and she thinks that this has aggravated her back. It is diffusely tender all over.   More complicated with multiple psych medications on board, cirrhosis.  Patient Active Problem List   Diagnosis Date Noted  . Right foot pain 10/09/2012  . Varicose veins of lower extremities with other complications 83/15/1761  . COPD with acute exacerbation 10/09/2012  . GERD (gastroesophageal reflux disease) 10/09/2012  . Callus of foot 09/06/2012  . Chest wall contusion 07/10/2012  . Urinary frequency 07/10/2012  . Cirrhosis of liver 02/02/2012  . Anxiety 07/11/2011  . Aortic dissection   . BACK PAIN, LUMBAR, CHRONIC 11/19/2009  . UNS ADVRS EFF OTH RX MEDICINAL&BIOLOGICAL SBSTNC 12/16/2008  . NAUSEA ALONE 10/23/2008  . UNSPECIFIED VITAMIN D DEFICIENCY 09/26/2008  . TOBACCO ABUSE 06/19/2008  . RESTLESS LEGS SYNDROME 04/27/2007  . INSOMNIA 04/27/2007  . Benign Neoplasm of Colon 04/04/2007  . HYPERLIPIDEMIA 03/08/2007  . OBSTRUCTIVE SLEEP APNEA 03/08/2007  . RHEUMATIC FEVER 03/08/2007  . HYPERTENSION 03/08/2007  . COPD 03/08/2007  . BIPOLAR AFFECTIVE DISORDER, HX OF 03/08/2007    Past Medical History  Diagnosis Date  . Adenomatous colon  polyp   . Insomnia   . RLS (restless legs syndrome)   . Colon polyp   . Incontinence   . Hepatitis B, chronic   . H/O: rheumatic fever   . Hyperlipidemia   . Unspecified disorders of nervous system   . Bipolar affective disorder     h/o  . H/O: CVA (cardiovascular accident)     TIA  . Fatty liver 04/09/08    found in abd CT  . Shortness of breath   . Obstructive sleep apnea     not using CPAP, last sleep study 2.5 years ago, per pt doctor is aware  . Blood transfusion 2000  . GERD (gastroesophageal reflux disease)   . Arthritis   . Aortic dissection     Type 1  . COPD (chronic obstructive pulmonary disease)   . Anxiety   . Depression   . Heart murmur     as a child  . Stroke     TIA- 2002  . Ulcer     Past Surgical History  Procedure Laterality Date  . Back surgery      x 45  . Cholecystectomy    . Rotator cuff repair      left  . Abdominal hysterectomy      total  . Lumbar fusion  10/09  . Hemorroidectomy      and colon polyp removed  . Abdominal exploration surgery    . Posterior cervical fusion/foraminotomy    .  Eye surgery      bilat  . Lumbar wound debridement  05/09/2011    Procedure: LUMBAR WOUND DEBRIDEMENT;  Surgeon: Dahlia Bailiff;  Location: [facility];  Service: Orthopedics;  Laterality: N/A;  IRRIGATION AND DEBRIDEMENT SPINAL WOUND  . Axillary artery cannulation via 8-mm hemashield graft, median sternotomy, extracorporeal circulation with deep hypothermic circulatory arrest, repair of  aortic dessection  01/23/2009    Dr Arlyce Dice  . Cataract      both eyes  . Colonoscopy    . Liver biopsy  04/10/2012    Procedure: LIVER BIOPSY;  Surgeon: Inda Castle, MD;  Location: [facility];  Service: Endoscopy;  Laterality: N/A;  ultrasound to mark order for abd limited/liver to be marked to be sent by [email or web address]     History   Social History  . Marital Status: Divorced    Spouse Name: N/A    Number of Children: 1  . Years of Education: N/A    Occupational History  . Disabled    Social History Main Topics  . Smoking status: Current Every Day Smoker -- 0.50 packs/day    Types: Cigarettes  . Smokeless tobacco: Never Used     Comment: Down to 1/2 a pack   . Alcohol Use: No  . Drug Use: No  . Sexually Active: Not on file   Other Topics Concern  . Not on file   Social History Narrative   1 son, 53+ y/o   Daily caffeine use: 2 cups daily   Does not get regular exercise   Disability due to bipolar    Family History  Problem Relation Age of Onset  . Colon cancer Neg Hx   . Esophageal cancer Neg Hx   . Rectal cancer Neg Hx   . Breast cancer Mother   . Lung cancer Mother   . Stomach cancer Father   . Diabetes      4 aunts, and 1 uncle    No Known Allergies  Medication list has been reviewed and updated.  Outpatient Prescriptions Prior to Visit  Medication Sig Dispense Refill  . ABILIFY 30 MG tablet TAKE 1 TABLET BY MOUTH EVERY MORNING  30 tablet  0  . albuterol (PROAIR HFA) 108 (90 BASE) MCG/ACT inhaler INHALE 2 PUFFS INTO THE LUNGS EVERY 6 (SIX) HOURS AS NEEDED FOR WHEEZING.  8.5 each  3  . albuterol (PROVENTIL HFA;VENTOLIN HFA) 108 (90 BASE) MCG/ACT inhaler INHALE 2 PUFFS INTO THE LUNGS EVERY 6 (SIX) HOURS AS NEEDED FOR WHEEZING.  8.5 each  0  . clonazePAM (KLONOPIN) 0.5 MG tablet TAKE 1 TABLET BY MOUTH TWICE A DAY AS NEEDED FOR ANXIETY  60 tablet  0  . [facility] STOOL SOFTENER 100 MG capsule TAKE 1 CAPSULE BY MOUTH 2 TIMES DAILY AS NEEDED FOR CONSTIPATION  60 capsule  3  . diazepam (VALIUM) 5 MG tablet TAKE 1 TABLET BY MOUTH TWICE A DAY AS NEEDED FOR MUSCLE SPASMS  60 tablet  0  . estradiol (ALORA) 0.1 MG/24HR Place 1 patch (0.1 mg total) onto the skin 2 (two) times a week.  8 patch  12  . fluocinonide-emollient (LIDEX-E) 0.05 % cream Apply topically 2 (two) times daily.  30 g  0  . lamoTRIgine (LAMICTAL) 100 MG tablet Take 3 tablets by mouth once daily      . LIDODERM 5 % PLACE 1 PATCH ONTO THE SKIN DAILY. REMOVE &  DISCARD PATCH WITHIN 12 HOURS OR AS DIRECTED BY MD  30 patch  0  . metFORMIN (GLUCOPHAGE-XR) 500 MG 24 hr tablet TAKE 1 TABLET (500 MG TOTAL) BY MOUTH DAILY WITH BREAKFAST.  30 tablet  5  . mirtazapine (REMERON) 15 MG tablet Take 7.5 mg by mouth at bedtime.       Marland Kitchen nystatin (MYCOSTATIN/NYSTOP) 100000 UNIT/GM POWD APPLY TOPICALLY 2 TIMES DAILY AS DIRECTED  15 g  0  . omeprazole (PRILOSEC) 40 MG capsule TAKE 1 CAPSULE BY MOUTH DAILY.  90 capsule  1  . promethazine (PHENERGAN) 25 MG tablet TAKE 1/2 TO 1 TABLET BY MOUTH EVERY 8 HOURS AS NEEDED FOR NAUSEA  30 tablet  0  . QUEtiapine (SEROQUEL) 400 MG tablet 300 mg.       . rOPINIRole (REQUIP) 3 MG tablet TAKE 1 TABLET BY MOUTH AT BEDTIME  30 tablet  0  . salicylic acid 6 % gel APPLY TO AFFECTED AREA EVERY DAY *USE AT NIGHT AND RINSE OFF IN THE MORNING*  40 g  0  . tiotropium (SPIRIVA HANDIHALER) 18 MCG inhalation capsule Place 1 capsule (18 mcg total) into inhaler and inhale daily.  30 capsule  12  . tolterodine (DETROL LA) 2 MG 24 hr capsule Take one capsule by mouth daily  30 capsule  5  . traMADol (ULTRAM) 50 MG tablet 2 tabs in the morning and 2 tabs at night as needed for pain.  120 tablet  1  . urea (CARMOL) 40 % CREA Apply nightly to feet      . vitamin D, CHOLECALCIFEROL, 400 UNITS tablet Take two by mouth daily  6 tablet  6  . zolpidem (AMBIEN) 10 MG tablet Take 10 mg by mouth at bedtime as needed. For sleep       No facility-administered medications prior to visit.    Review of Systems:   GEN: No fevers, chills. Nontoxic. Primarily MSK c/o today. MSK: Detailed in the HPI GI: tolerating PO intake without difficulty Neuro: No numbness, parasthesias, or tingling associated. Otherwise the pertinent positives of the ROS are noted above.    Physical Examination: BP 102/64  Pulse 92  Temp(Src) 98.1 F (36.7 C) (Oral)  Wt 200 lb 8 oz (90.946 kg)  BMI 36.99 kg/m2  SpO2 94%  Ideal Body Weight:     GEN:  Well-developed,well-nourished,in no acute distress; alert,appropriate and cooperative throughout examination HEENT: Normocephalic and atraumatic without obvious abnormalities. Ears, externally no deformities PULM: Breathing comfortably in no respiratory distress EXT: No clubbing, cyanosis, or edema PSYCH: Normally interactive. Cooperative during the interview. Pleasant. Friendly and conversant. Not anxious or depressed appearing. Normal, full affect.  Range of motion at  the waist: Flexion, extension, lateral bending and rotation:  Flexion to 30 deg, minimal extension, lateral bending and rottion minimal.  No echymosis or edema Rises to examination table with mild difficulty Gait: minimally antalgic  Inspection/Deformity: N Paraspinus Tenderness: diffuse l1-s1  B Ankle Dorsiflexion (L5,4): 5/5 B Great Toe Dorsiflexion (L5,4): 5/5 Heel Walk (L5): WNL Toe Walk (S1): WNL Rise/Squat (L4): WNL, mild pain  SENSORY B Medial Foot (L4): WNL B Dorsum (L5): WNL B Lateral (S1): WNL Light Touch: WNL Pinprick: WNL  REFLEXES Knee (L4): 2+ Ankle (S1): 2+  B SLR, seated: pain without radiculopathy B SLR, supine: pain without radiculopathy B Greater Troch: NT B Log Roll: neg B Sciatic Notch: TTP  Dg Lumbar Spine Complete  01/02/2013   *RADIOLOGY REPORT*  Clinical Data: Low back pain.  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  Five previous lumbar surgeries.  LUMBAR  SPINE - COMPLETE 4+ VIEW  Comparison: Radiographs dated 11/16/2009  Findings: There are solid, body and posterior fusions at L4-5 and L5-L1.  There is nonunion of the fusion at L3-4 with lucency around the pedicle screws in L3 and L4, unchanged since the prior radiograph of 11/16/2009.  Neurostimulator device is seen in the lower thoracic spine. The wires are not attached to the generator.  IMPRESSION: Stable appearance of the lumbar spine since the prior study. 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   Original Report Authenticated  By: Lorriane Shire, M.D.    Assessment and Plan:  Low back pain - Plan: Ambulatory referral to Orthopedic Surgery, DG Lumbar Spine Complete  Failed back syndrome of lumbar spine - Plan: Ambulatory referral to Orthopedic Surgery, DG Lumbar Spine Complete  Spinal in nature in a patient with 5-6 prior surgeries - spine involvement I think is important here. I asked Dr. Rolena Infante to evaluate the patient, and his office suggested that Dr. Nelva Bush see her, which I would also be a good idea.   Orders Today:  Orders Placed This Encounter  Procedures  . DG Lumbar Spine Complete    Standing Status: Future     Number of Occurrences: 1     Standing Expiration Date: 03/04/2014    Order Specific Question:  Preferred imaging location?    Answer:  [facility]    Order Specific Question:  Reason for exam:    Answer:  s/p surgery x 5, back pain  . Ambulatory referral to Orthopedic Surgery    Referral Priority:  Routine    Referral Type:  Surgical    Referral Reason:  Specialty Services Required    Referred to Provider:  Melina Schools, MD    Requested Specialty:  Orthopedic Surgery    Number of Visits Requested:  1    Updated Medication List: (Includes new medications, updates to list, dose adjustments) No orders of the defined types were placed in this encounter.    Medications Discontinued: There are no discontinued medications.    Signed, Maud Deed. Amarrion Pastorino, MD 01/02/2013 11:36 AM

## 2013-01-03 DIAGNOSIS — M5137 Other intervertebral disc degeneration, lumbosacral region: Secondary | ICD-10-CM | POA: Diagnosis not present

## 2013-01-03 DIAGNOSIS — M961 Postlaminectomy syndrome, not elsewhere classified: Secondary | ICD-10-CM | POA: Diagnosis not present

## 2013-01-03 DIAGNOSIS — G894 Chronic pain syndrome: Secondary | ICD-10-CM | POA: Diagnosis not present

## 2013-01-05 ENCOUNTER — Encounter: Payer: Self-pay | Admitting: Family Medicine

## 2013-01-05 ENCOUNTER — Other Ambulatory Visit: Payer: Self-pay | Admitting: Family Medicine

## 2013-01-07 ENCOUNTER — Other Ambulatory Visit: Payer: Self-pay | Admitting: Family Medicine

## 2013-01-07 NOTE — Telephone Encounter (Signed)
[facility]

## 2013-01-07 NOTE — Telephone Encounter (Signed)
[facility]

## 2013-01-08 NOTE — Addendum Note (Signed)
Addended by: Ricki Miller on: 01/08/2013 11:25 AM   Modules accepted: Orders

## 2013-01-08 NOTE — Telephone Encounter (Signed)
[facility]ol [facility] but they said pt already had refill on file so they will disregard today's refill and fill the one on file.

## 2013-01-09 ENCOUNTER — Ambulatory Visit: Payer: Self-pay | Admitting: Family Medicine

## 2013-01-09 DIAGNOSIS — Z1231 Encounter for screening mammogram for malignant neoplasm of breast: Secondary | ICD-10-CM | POA: Diagnosis not present

## 2013-01-10 ENCOUNTER — Other Ambulatory Visit: Payer: Self-pay | Admitting: Family Medicine

## 2013-01-10 DIAGNOSIS — M545 Low back pain, unspecified: Secondary | ICD-10-CM | POA: Diagnosis not present

## 2013-01-10 DIAGNOSIS — R209 Unspecified disturbances of skin sensation: Secondary | ICD-10-CM | POA: Diagnosis not present

## 2013-01-10 DIAGNOSIS — M47817 Spondylosis without myelopathy or radiculopathy, lumbosacral region: Secondary | ICD-10-CM | POA: Diagnosis not present

## 2013-01-10 DIAGNOSIS — M5137 Other intervertebral disc degeneration, lumbosacral region: Secondary | ICD-10-CM | POA: Diagnosis not present

## 2013-01-10 DIAGNOSIS — M25559 Pain in unspecified hip: Secondary | ICD-10-CM | POA: Diagnosis not present

## 2013-01-11 ENCOUNTER — Encounter: Payer: Self-pay | Admitting: Family Medicine

## 2013-01-14 ENCOUNTER — Encounter: Payer: Self-pay | Admitting: *Deleted

## 2013-01-14 ENCOUNTER — Encounter: Payer: Self-pay | Admitting: Family Medicine

## 2013-01-21 ENCOUNTER — Other Ambulatory Visit: Payer: Self-pay | Admitting: Family Medicine

## 2013-01-24 DIAGNOSIS — M961 Postlaminectomy syndrome, not elsewhere classified: Secondary | ICD-10-CM | POA: Diagnosis not present

## 2013-01-24 DIAGNOSIS — G894 Chronic pain syndrome: Secondary | ICD-10-CM | POA: Diagnosis not present

## 2013-01-30 ENCOUNTER — Other Ambulatory Visit: Payer: Self-pay | Admitting: Family Medicine

## 2013-02-02 ENCOUNTER — Other Ambulatory Visit: Payer: Self-pay | Admitting: Family Medicine

## 2013-02-05 NOTE — Telephone Encounter (Signed)
[facility]

## 2013-02-06 ENCOUNTER — Ambulatory Visit (AMBULATORY_SURGERY_CENTER): Payer: Medicare Other

## 2013-02-06 VITALS — Ht 63.0 in | Wt 201.0 lb

## 2013-02-06 DIAGNOSIS — Z8719 Personal history of other diseases of the digestive system: Secondary | ICD-10-CM

## 2013-02-10 IMAGING — CR DG KNEE 1-2V*R*
1 series · 2 of 2 positions shown · non-contrast
Comparison: none

REASON FOR EXAM: pain post fall
COMMENTS:

PROCEDURE:     DXR - DXR KNEE RIGHT AP AND LATERAL  - June 22, 2012 [DATE]
RESULT:     No acute bony or joint abnormality. No evidence of fracture.

[Series 4: t knee ap right · 0.14mm/px · 2 of 2 slices shown]
[im 1/2]
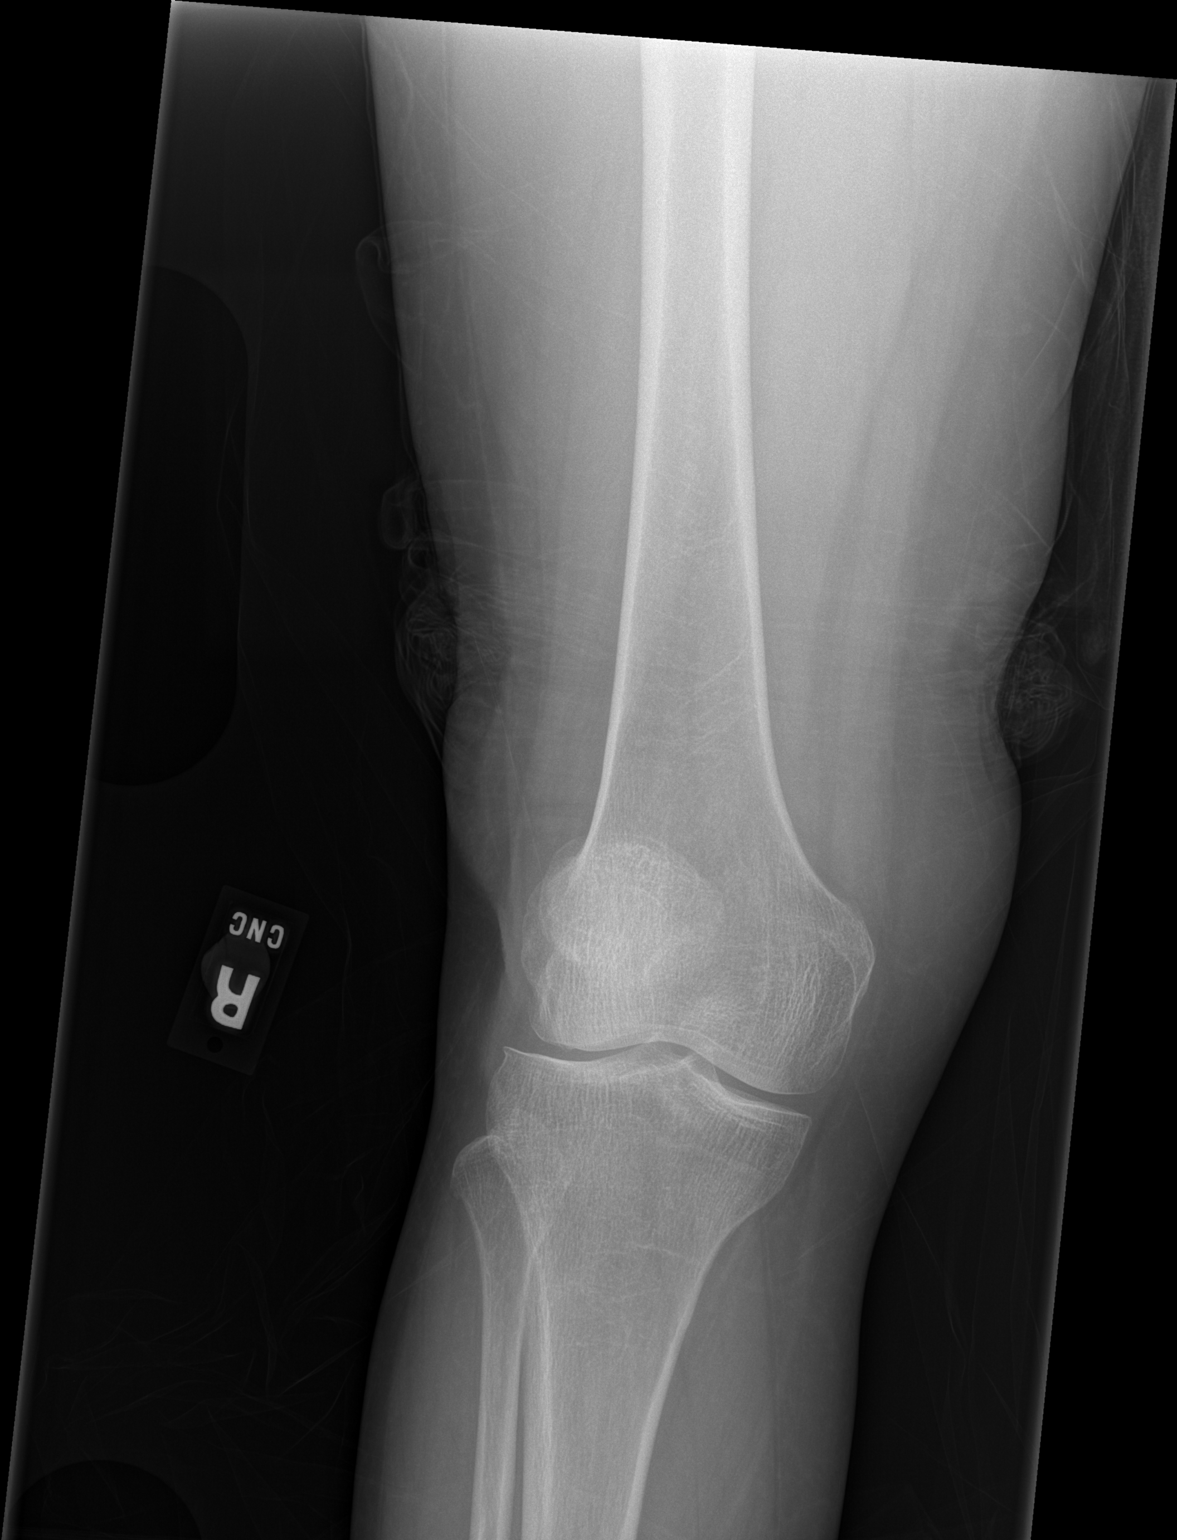
[im 2/2]
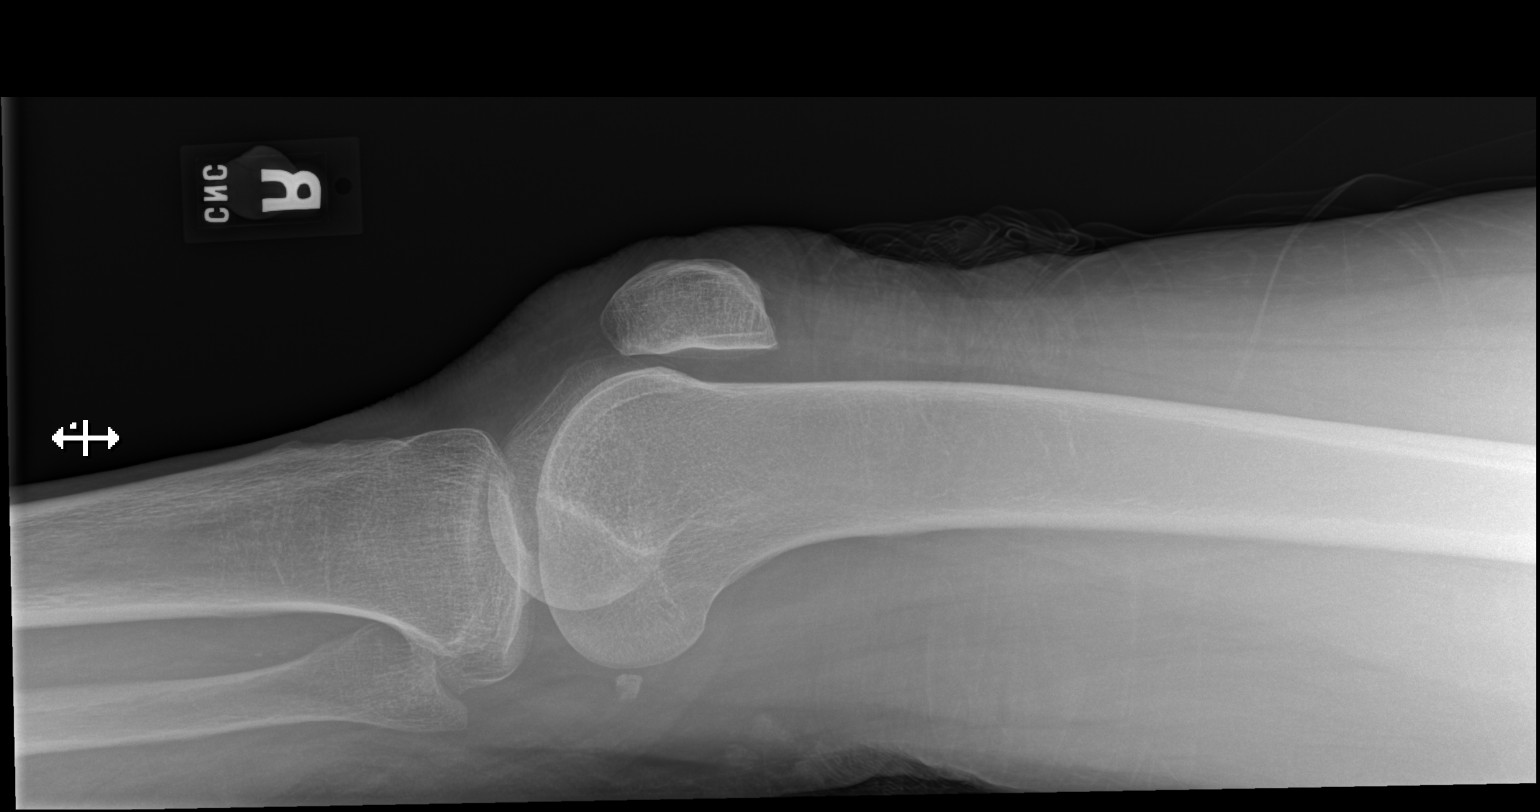

[2 of 2 positions shown; findings below may reference images not displayed]

IMPRESSION: No acute abnormality.

## 2013-02-11 DIAGNOSIS — Z23 Encounter for immunization: Secondary | ICD-10-CM | POA: Diagnosis not present

## 2013-02-14 IMAGING — CT CT CHEST W/O CM
2 of 5 series · 16 of 30 positions shown, 19 images · non-contrast
Comparison: CT chest of 06/22/2011 and 09/22/2010

CLINICAL DATA: Followup lung nodule, history of recent fall with
breast soreness left greater than right, smoking history

CT CHEST WITHOUT CONTRAST
TECHNIQUE: Multidetector CT imaging of the chest was performed
following the standard protocol without IV contrast.

[Series 2: thin chest · axial · 0.70mm/px · z∈[-257,-38]mm · 12 of 259 slices shown, 15 images]
[im 20/259  mediastinal]
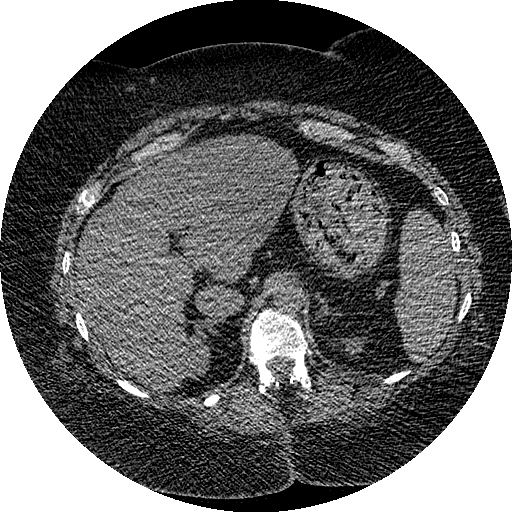
[im 20/259  lung]
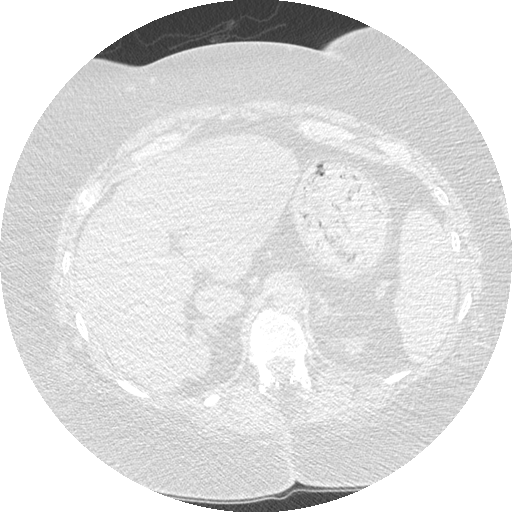
[im 40/259  lung]
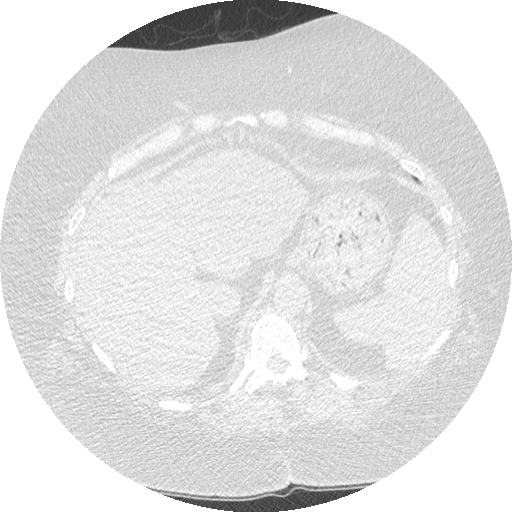
[im 60/259  lung]
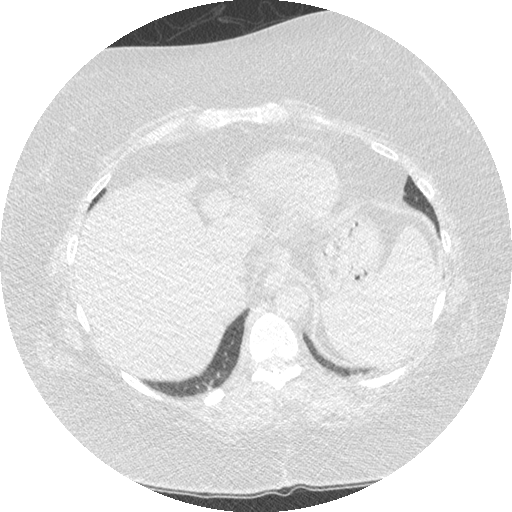
[im 80/259  lung]
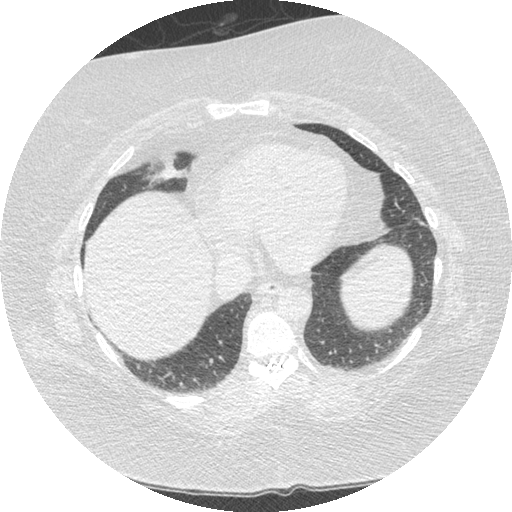
[im 100/259  mediastinal]
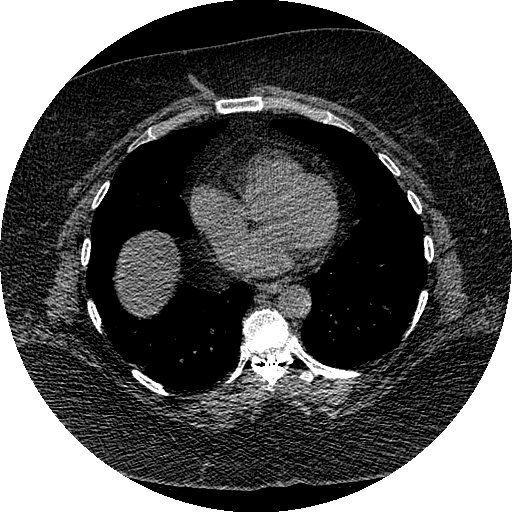
[im 100/259  lung]
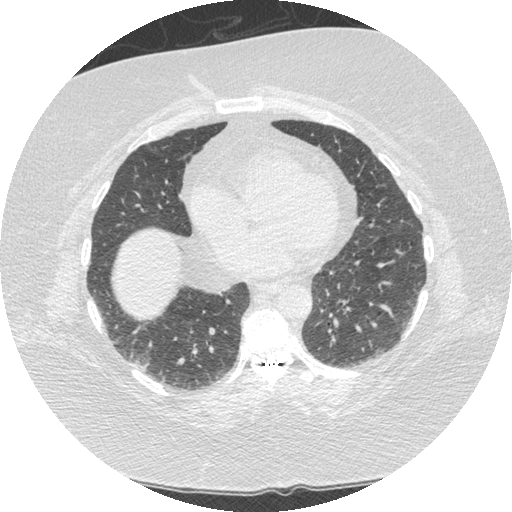
[im 120/259  lung]
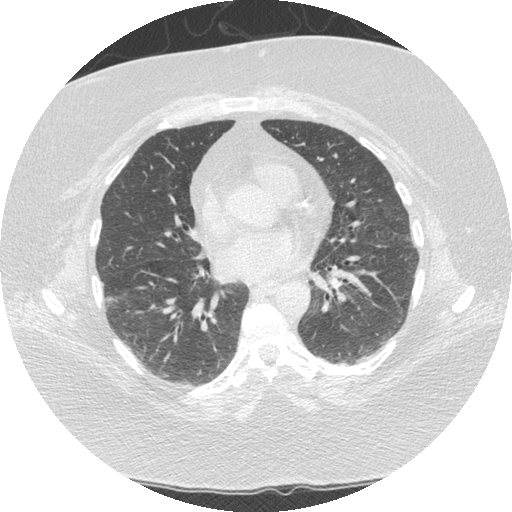
[im 139/259  lung]
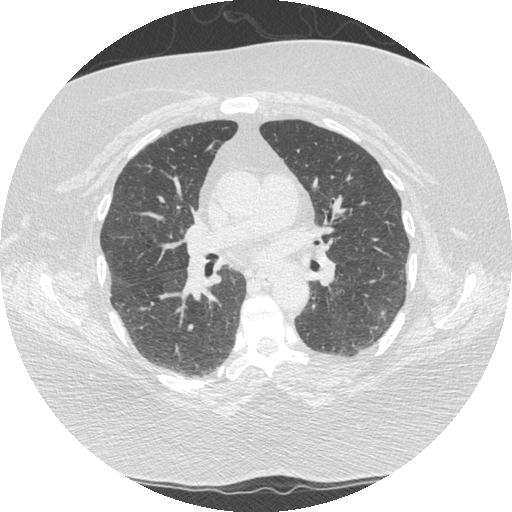
[im 159/259  lung]
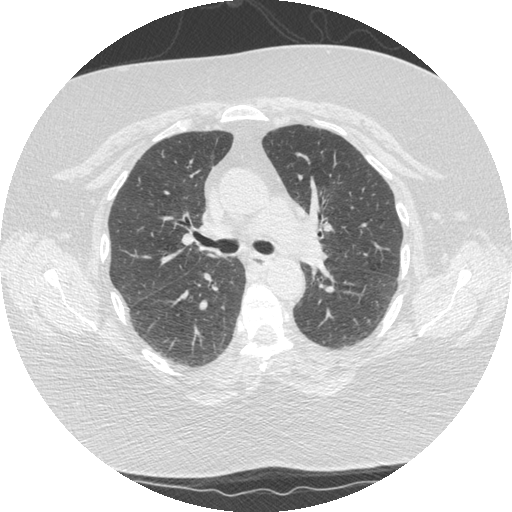
[im 179/259  mediastinal]
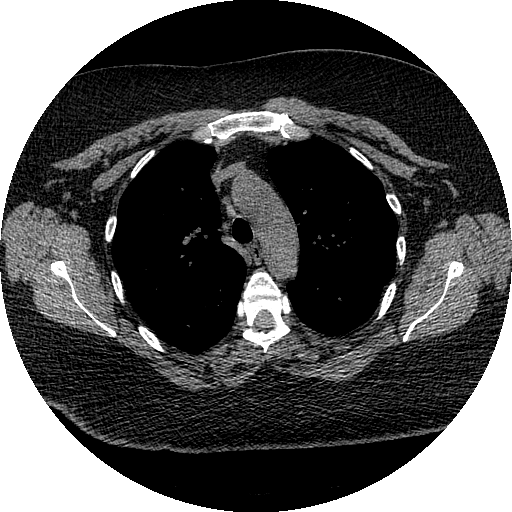
[im 179/259  lung]
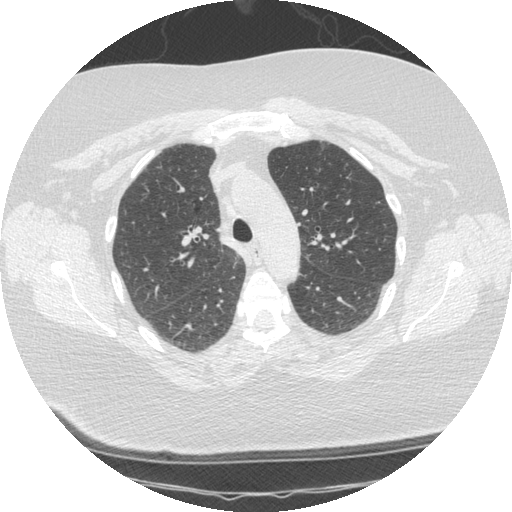
[im 199/259  lung]
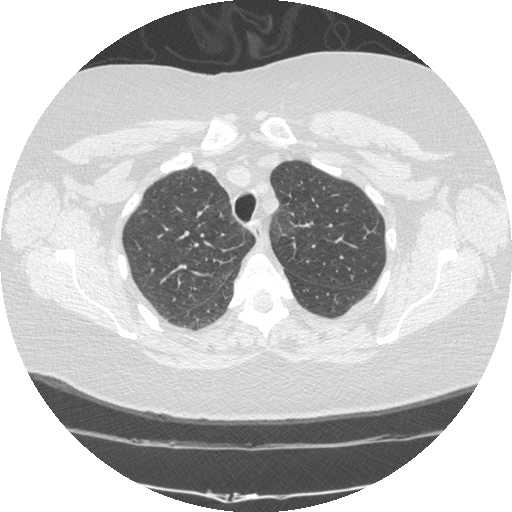
[im 219/259  lung]
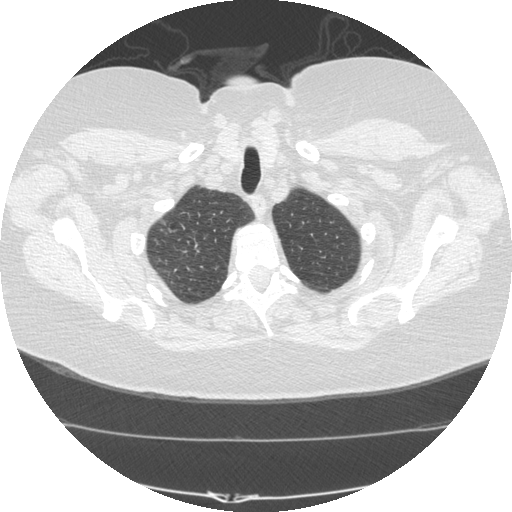
[im 239/259  lung]
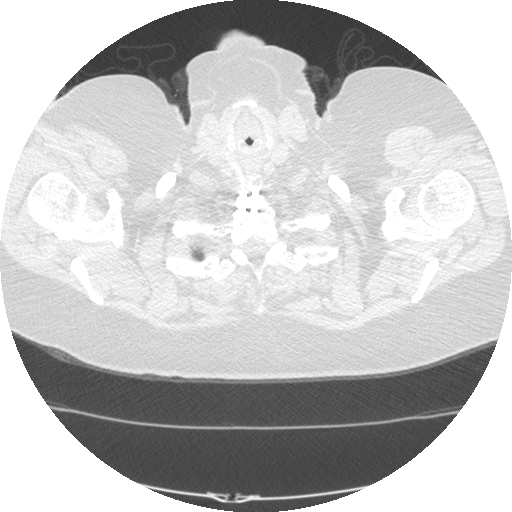

[Series 602: sagittal body · sagittal · 0.70mm/px · 4 of 145 slices shown]
[im 21/145  mediastinal]
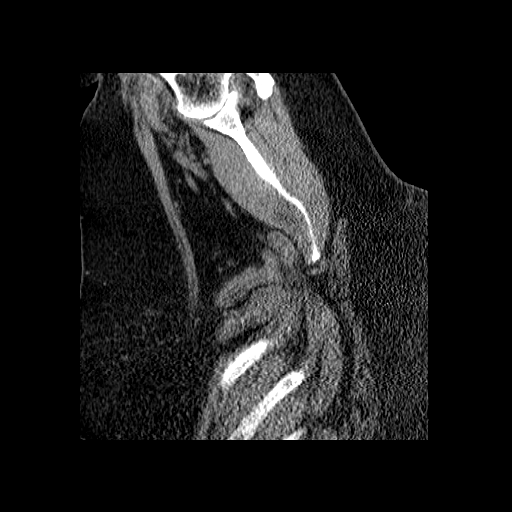
[im 42/145  mediastinal]
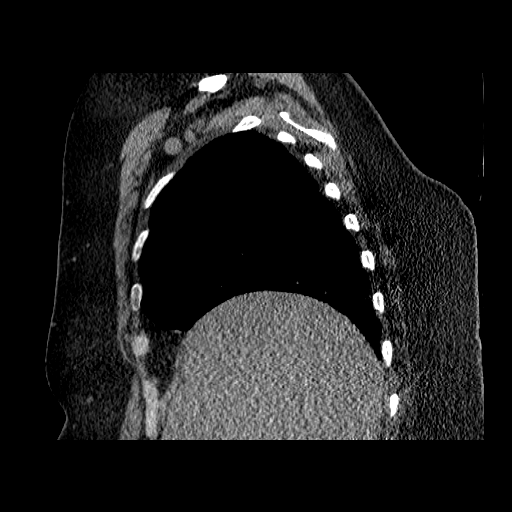
[im 62/145  mediastinal]
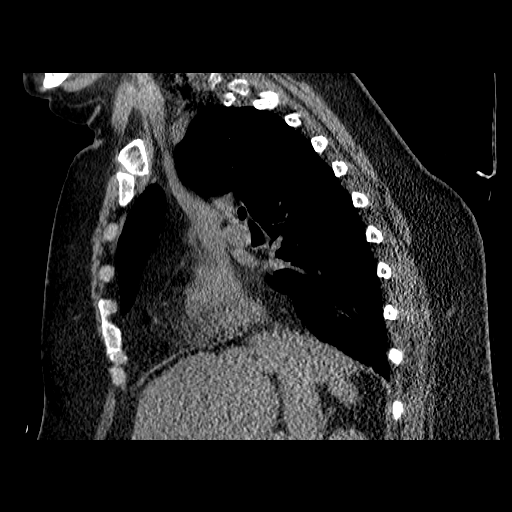
[im 83/145  mediastinal]
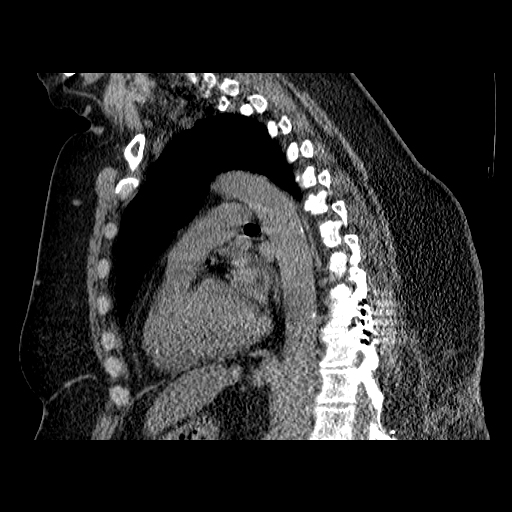

[16 of 30 positions shown; findings below may reference images not displayed]

FINDINGS: The small ground-glass opacity in the right lung apex
measures 8 mm in diameter compared to the prior measurement of 5 mm
in 1211.  Continued follow-up is recommended. The noncalcified 3-mm
nodule in the posterior left lower lobe also is completely stable.
No other lung nodule is seen.  No parenchymal infiltrate is noted
and there is no evidence of pleural effusion the central airway is
patent.  A lower anterior cervical spine fusion plate is noted.
Neurostimulator electrode leads extending to the lower thoracic
spinal canal.

On soft tissue window images, the thyroid gland is unremarkable.
On this unenhanced study, no definite mediastinal or hilar
adenopathy is seen.  Coronary artery calcifications are present in
the distribution of the left anterior descending artery.  The upper
abdomen is unremarkable.  Mild fatty infiltration of the liver is a
consideration.
IMPRESSION: 1. The ground-glass opacity in the right lung apex measures 8 mm
compared to prior measurement of 5 mm in 1211 and 7 mm in 0813.
Continued follow-up is recommended.
2.  Stable noncalcified 3-mm left lower lobe nodule.
3.  No mediastinal or hilar adenopathy.
4.  Probable fatty infiltration of the liver.

## 2013-02-16 ENCOUNTER — Other Ambulatory Visit: Payer: Self-pay | Admitting: Family Medicine

## 2013-02-18 ENCOUNTER — Other Ambulatory Visit: Payer: Self-pay | Admitting: Family Medicine

## 2013-02-19 IMAGING — CT CT CHEST-ABD-PELV W/ CM
1 of 2 series · 13 of 29 positions shown, 18 images · non-contrast
Comparison: none

REASON FOR EXAM: (1) IV CONTRAST ONLY; FALL W/ L CP; (2) LUQ PAIN; COUGH,
COMMENTS:   May transport without cardiac monitor

PROCEDURE:     CT  - CT CHEST ABDOMEN AND PELVIS W  - July 01, 2012  [DATE]
RESULT:
TECHNIQUE: CT of the chest, abdomen and pelvis is performed. Images are
reconstructed at 3 mm slice thickness in the axial plane and compared to
images dated 31 August, 2009 through the abdomen and pelvis.

[Series 2: soft tissue · axial · 0.86mm/px · z∈[-629,-80]mm · 13 of 207 slices shown, 18 images]
[im 12/207  mediastinal]
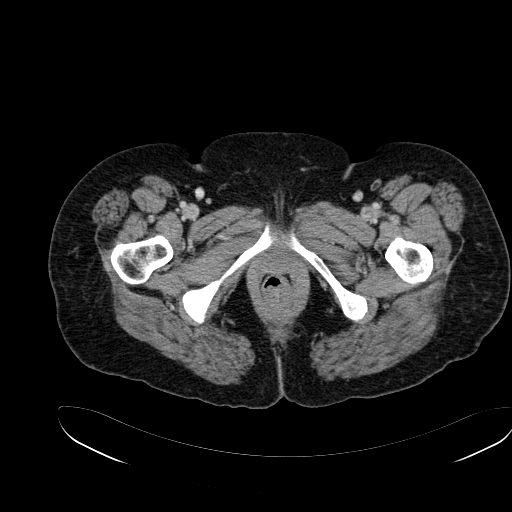
[im 12/207  bone]
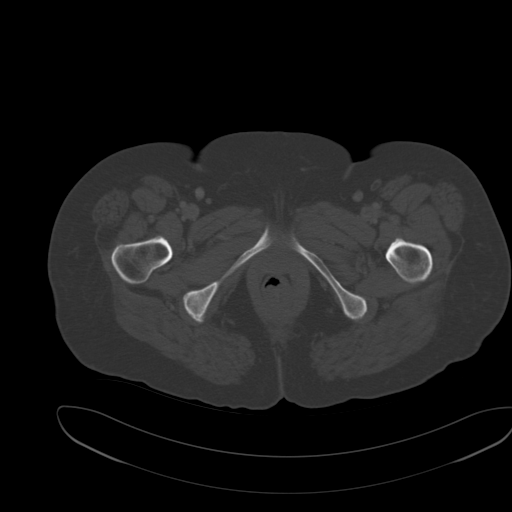
[im 35/207  mediastinal]
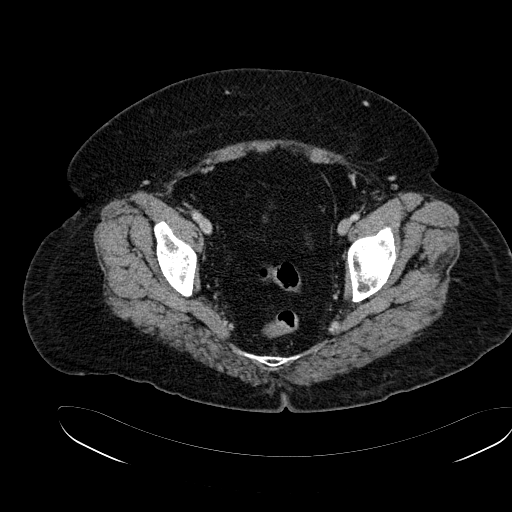
[im 46/207  mediastinal]
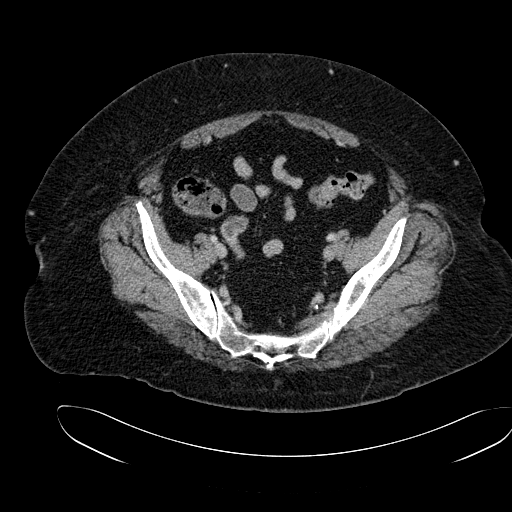
[im 69/207  mediastinal]
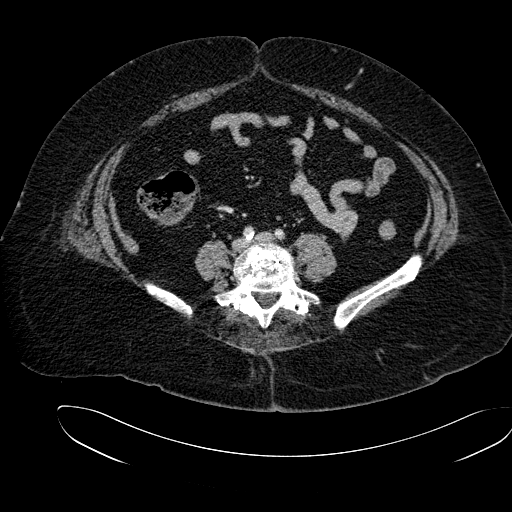
[im 81/207  mediastinal]
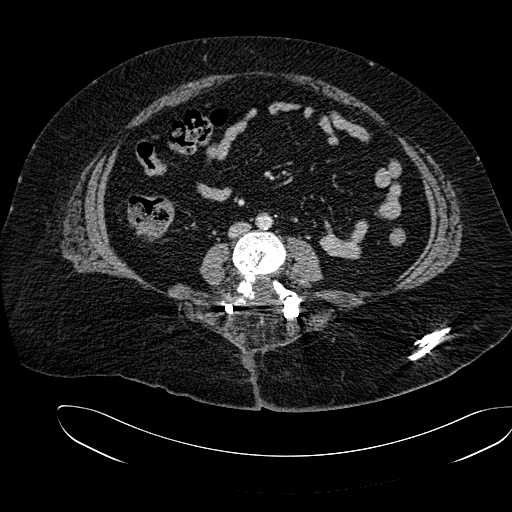
[im 101/207  mediastinal]
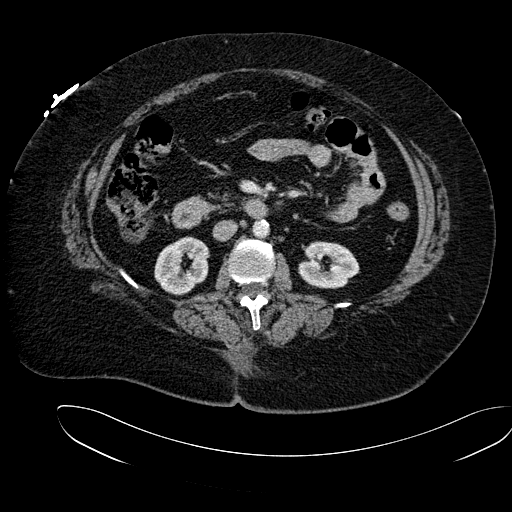
[im 104/207  mediastinal]
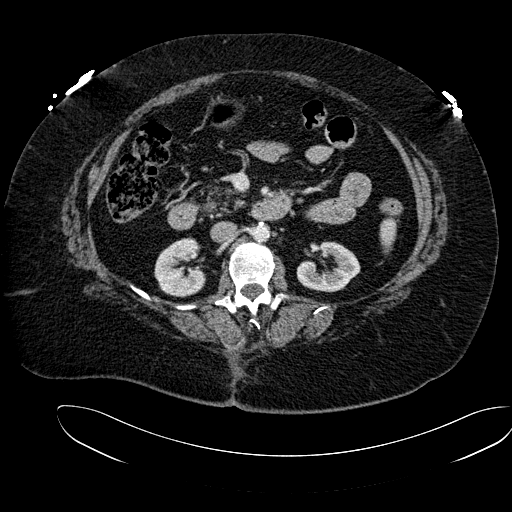
[im 126/207  mediastinal]
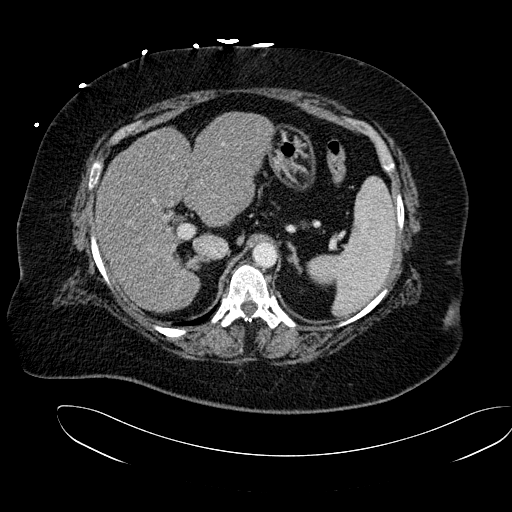
[im 138/207  mediastinal]
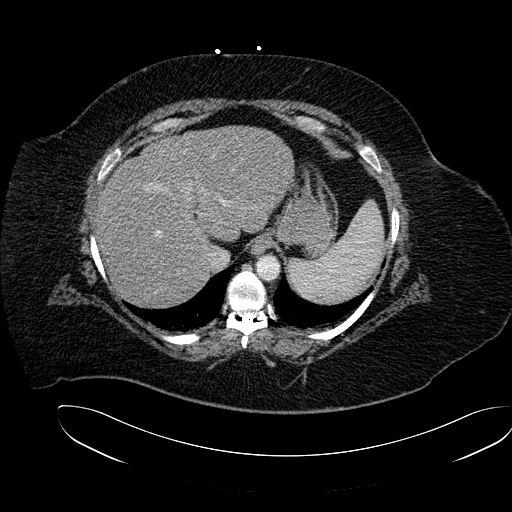
[im 138/207  bone]
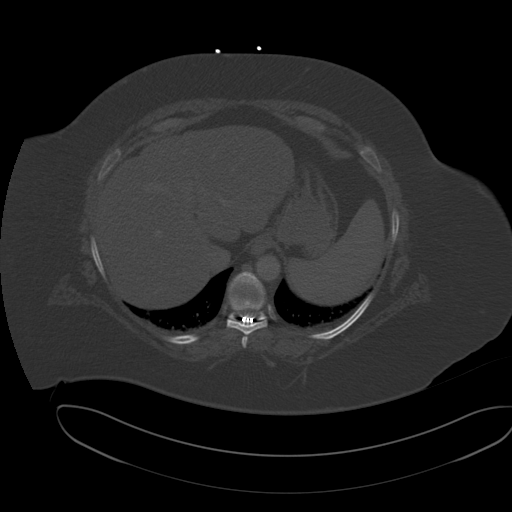
[im 161/207  mediastinal]
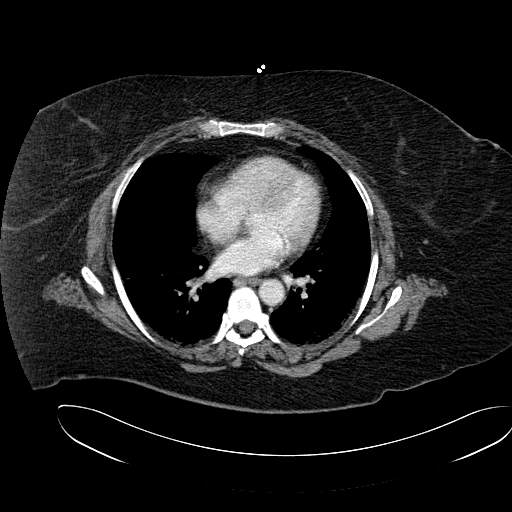
[im 161/207  lung]
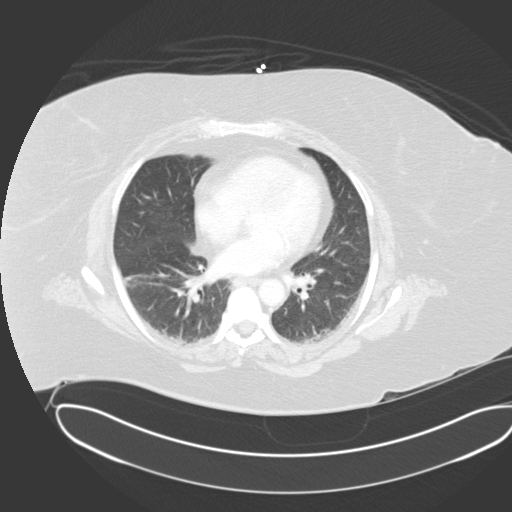
[im 172/207  mediastinal]
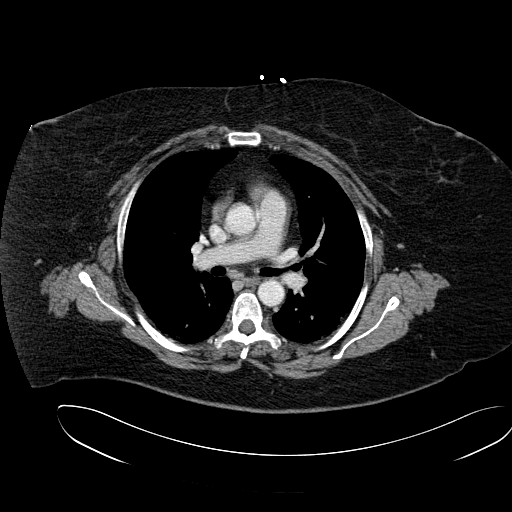
[im 172/207  lung]
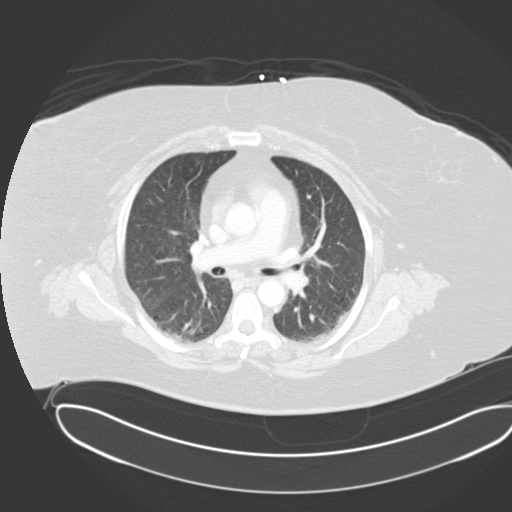
[im 184/207  lung]
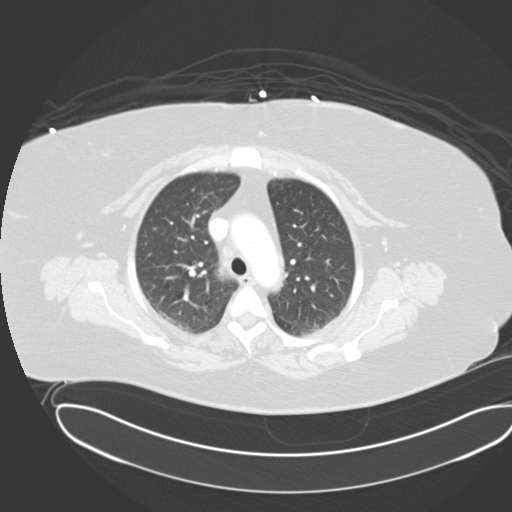
[im 195/207  mediastinal]
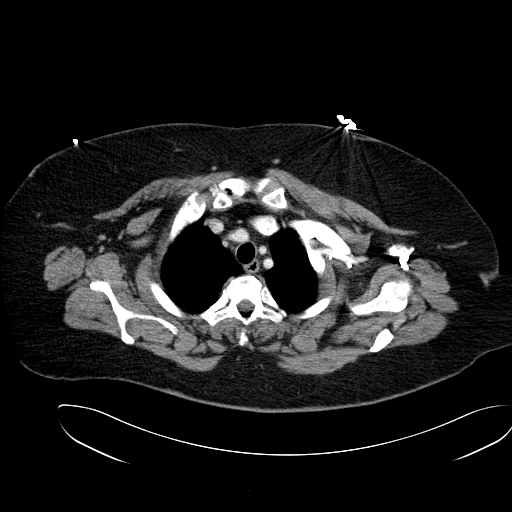
[im 195/207  lung]
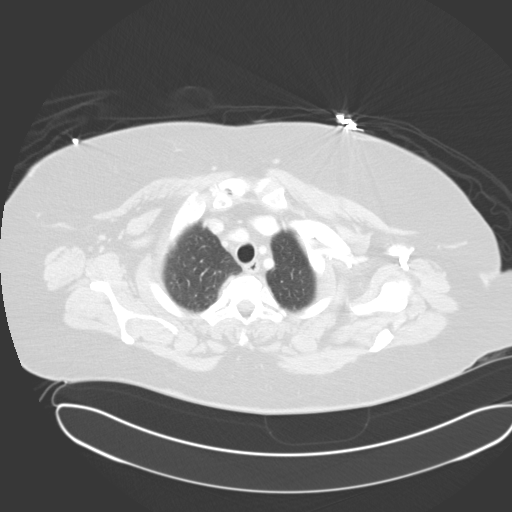

[13 of 29 positions shown; findings below may reference images not displayed]

FINDINGS: No pleural or pericardial effusion is evident. Dependent
emphysematous changes are seen in the lungs. The heart size is normal. The
thoracic aorta is normal. No adenopathy is evident. The included thyroid
lobes appear to be unremarkable. There is a band of discoid atelectasis or
fibrosis near the right lung base in the right lower lobe. There is no
discrete pulmonary mass. Again, there is no pneumothorax or pleural
effusion. There are changes of a previous lower anterior cervical fusion.
There is reported a questionable lucency in the superior articular facet of
T2 on the left on the preliminary report. On the axial images, this appears
as a vertical orientation possibly with a small fracture laterally. No
definite instability is suggested. The other bony structures appear
unremarkable. The liver shows a somewhat nodular appearance which can be
consistent with cirrhosis. There is no focal mass evident. Prominent portal
veins are demonstrated. There are changes of a previous cholecystectomy. The
spleen enhances homogeneously. Atherosclerotic calcification is seen within
the abdominal aorta. The pancreas is atrophic. There is no discrete mass.
The adrenal glands are unremarkable. The kidneys appear intact with no
obstruction or mass. There is no significant ascites or abnormal fluid
collection. There is no evidence of adenopathy. The urinary bladder is
intact and unremarkable. A normal appearing appendix appears present. There
is no evidence of abnormal bowel distention. The stomach is nondistended.
The folds appear to be somewhat thickened which can be seen in gastritis.
This could be an artifact because of the nondistended state of the stomach.
Correlate clinically. There is a neurostimulator device over the left flank
region just above the left iliac crest. Electrodes are in the lower thoracic
region. There are changes of previous lumbar fusion and laminectomies in the
mid and lower lumbar spine.
IMPRESSION: 1.  Minimal lucency along the superior left aspect of the superior articular
process of T2 which could represent a tiny fracture.
2.  Thickening of the gastric folds which could be artifact. Correlate
clinically.
3.  Cirrhotic appearing liver. There is diffuse fatty infiltration present
in the liver. Post operative changes from previous lumbar surgery and
cervical spine surgery with a neurostimulator present.

[REDACTED]

## 2013-02-20 ENCOUNTER — Encounter: Payer: Medicare Other | Admitting: Gastroenterology

## 2013-03-09 ENCOUNTER — Other Ambulatory Visit: Payer: Self-pay | Admitting: Family Medicine

## 2013-03-10 NOTE — Telephone Encounter (Signed)
Last office visit 01/02/2013 with Dr. Lorelei Pont.  Ok to refill?

## 2013-03-11 NOTE — Telephone Encounter (Signed)
Called to pharmacy 

## 2013-03-12 ENCOUNTER — Other Ambulatory Visit: Payer: Self-pay | Admitting: Family Medicine

## 2013-03-12 NOTE — Telephone Encounter (Signed)
Last OV with Dr. Deborra Medina 12/11/2012.  Ok to refill?

## 2013-03-13 NOTE — Telephone Encounter (Signed)
Phoned in to pharmacy.

## 2013-03-16 ENCOUNTER — Other Ambulatory Visit: Payer: Self-pay | Admitting: Family Medicine

## 2013-03-18 DIAGNOSIS — F312 Bipolar disorder, current episode manic severe with psychotic features: Secondary | ICD-10-CM | POA: Diagnosis not present

## 2013-03-19 ENCOUNTER — Other Ambulatory Visit: Payer: Self-pay | Admitting: Family Medicine

## 2013-03-20 ENCOUNTER — Telehealth: Payer: Self-pay | Admitting: Gastroenterology

## 2013-03-20 NOTE — Telephone Encounter (Signed)
Spoke with patient, answered questions regarding metformin and valium. No further questions from patient at this time.

## 2013-03-27 ENCOUNTER — Ambulatory Visit (AMBULATORY_SURGERY_CENTER): Payer: Medicare Other | Admitting: Gastroenterology

## 2013-03-27 ENCOUNTER — Encounter: Payer: Self-pay | Admitting: Gastroenterology

## 2013-03-27 VITALS — BP 123/74 | HR 77 | Temp 96.4°F | Resp 19 | Ht 63.0 in | Wt 201.0 lb

## 2013-03-27 DIAGNOSIS — I10 Essential (primary) hypertension: Secondary | ICD-10-CM | POA: Diagnosis not present

## 2013-03-27 DIAGNOSIS — K7460 Unspecified cirrhosis of liver: Secondary | ICD-10-CM | POA: Diagnosis not present

## 2013-03-27 DIAGNOSIS — K219 Gastro-esophageal reflux disease without esophagitis: Secondary | ICD-10-CM | POA: Diagnosis not present

## 2013-03-27 DIAGNOSIS — F341 Dysthymic disorder: Secondary | ICD-10-CM | POA: Diagnosis not present

## 2013-03-27 DIAGNOSIS — J449 Chronic obstructive pulmonary disease, unspecified: Secondary | ICD-10-CM | POA: Diagnosis not present

## 2013-03-27 DIAGNOSIS — F319 Bipolar disorder, unspecified: Secondary | ICD-10-CM | POA: Diagnosis not present

## 2013-03-27 DIAGNOSIS — Z8719 Personal history of other diseases of the digestive system: Secondary | ICD-10-CM

## 2013-03-27 LAB — GLUCOSE, CAPILLARY: Glucose-Capillary: 116 mg/dL — ABNORMAL HIGH (ref 70–99)

## 2013-03-27 IMAGING — CR DG HAND COMPLETE 3+V*L*
1 series · 3 of 3 positions shown · non-contrast
Comparison: none

REASON FOR EXAM: fall/pain
COMMENTS:

[Series 1: pa · 0.17mm/px · 3 of 3 slices shown]
[im 1/3]
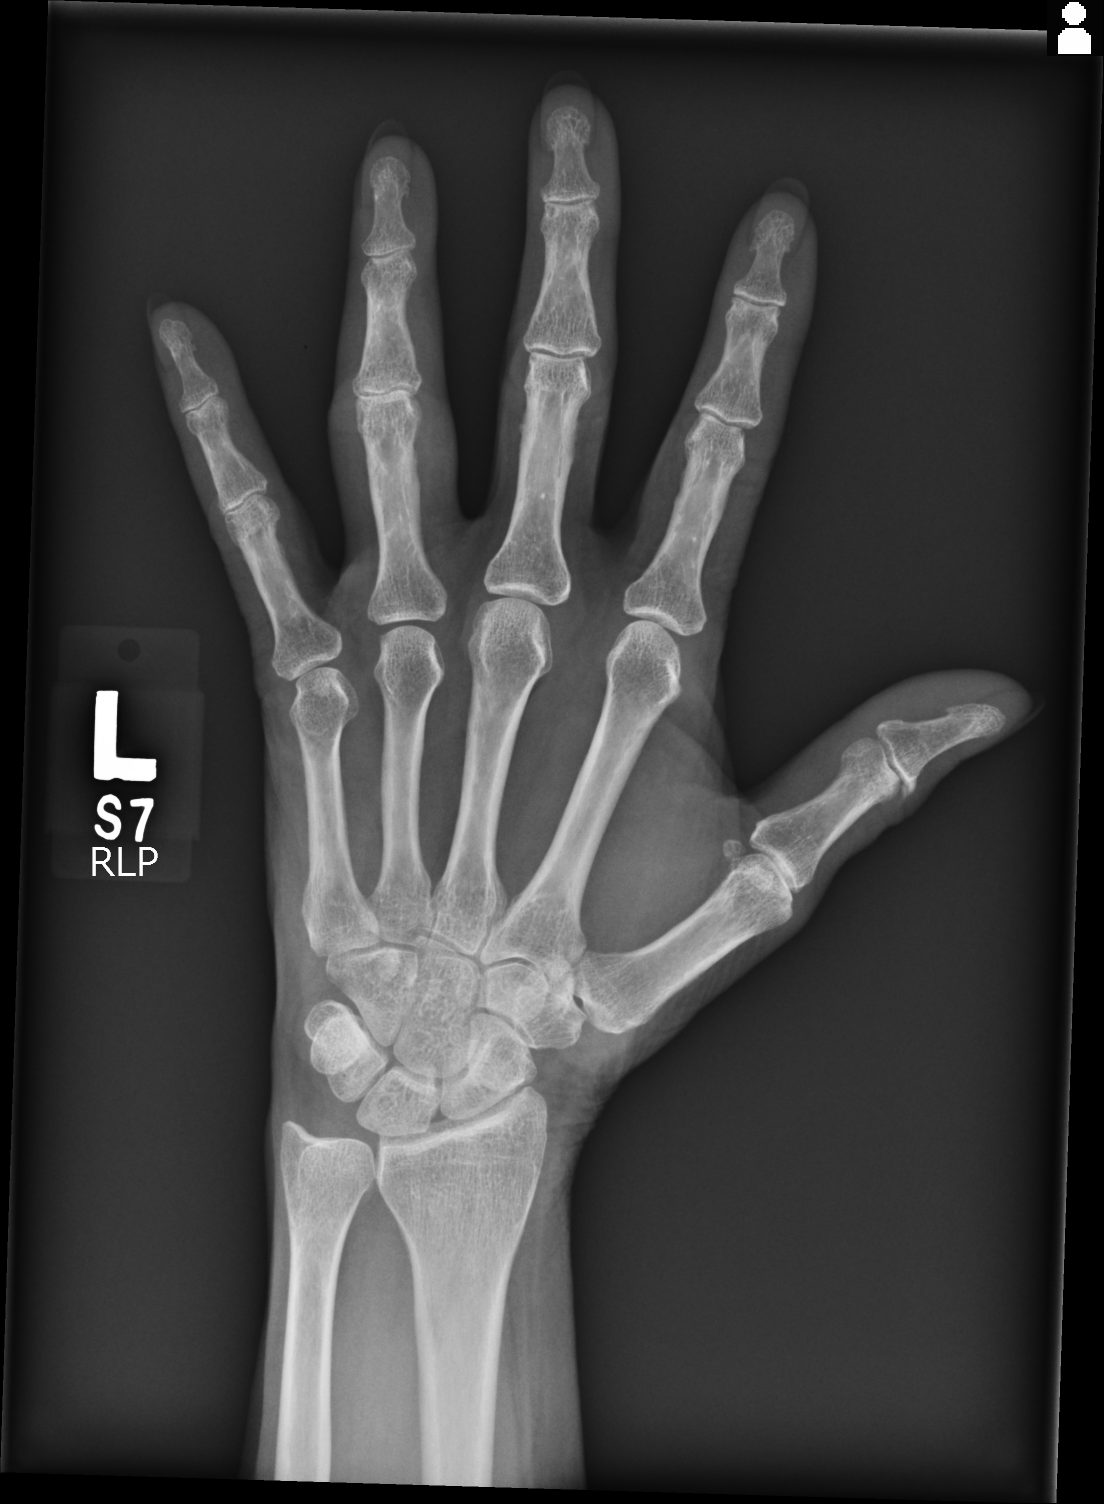
[im 2/3]
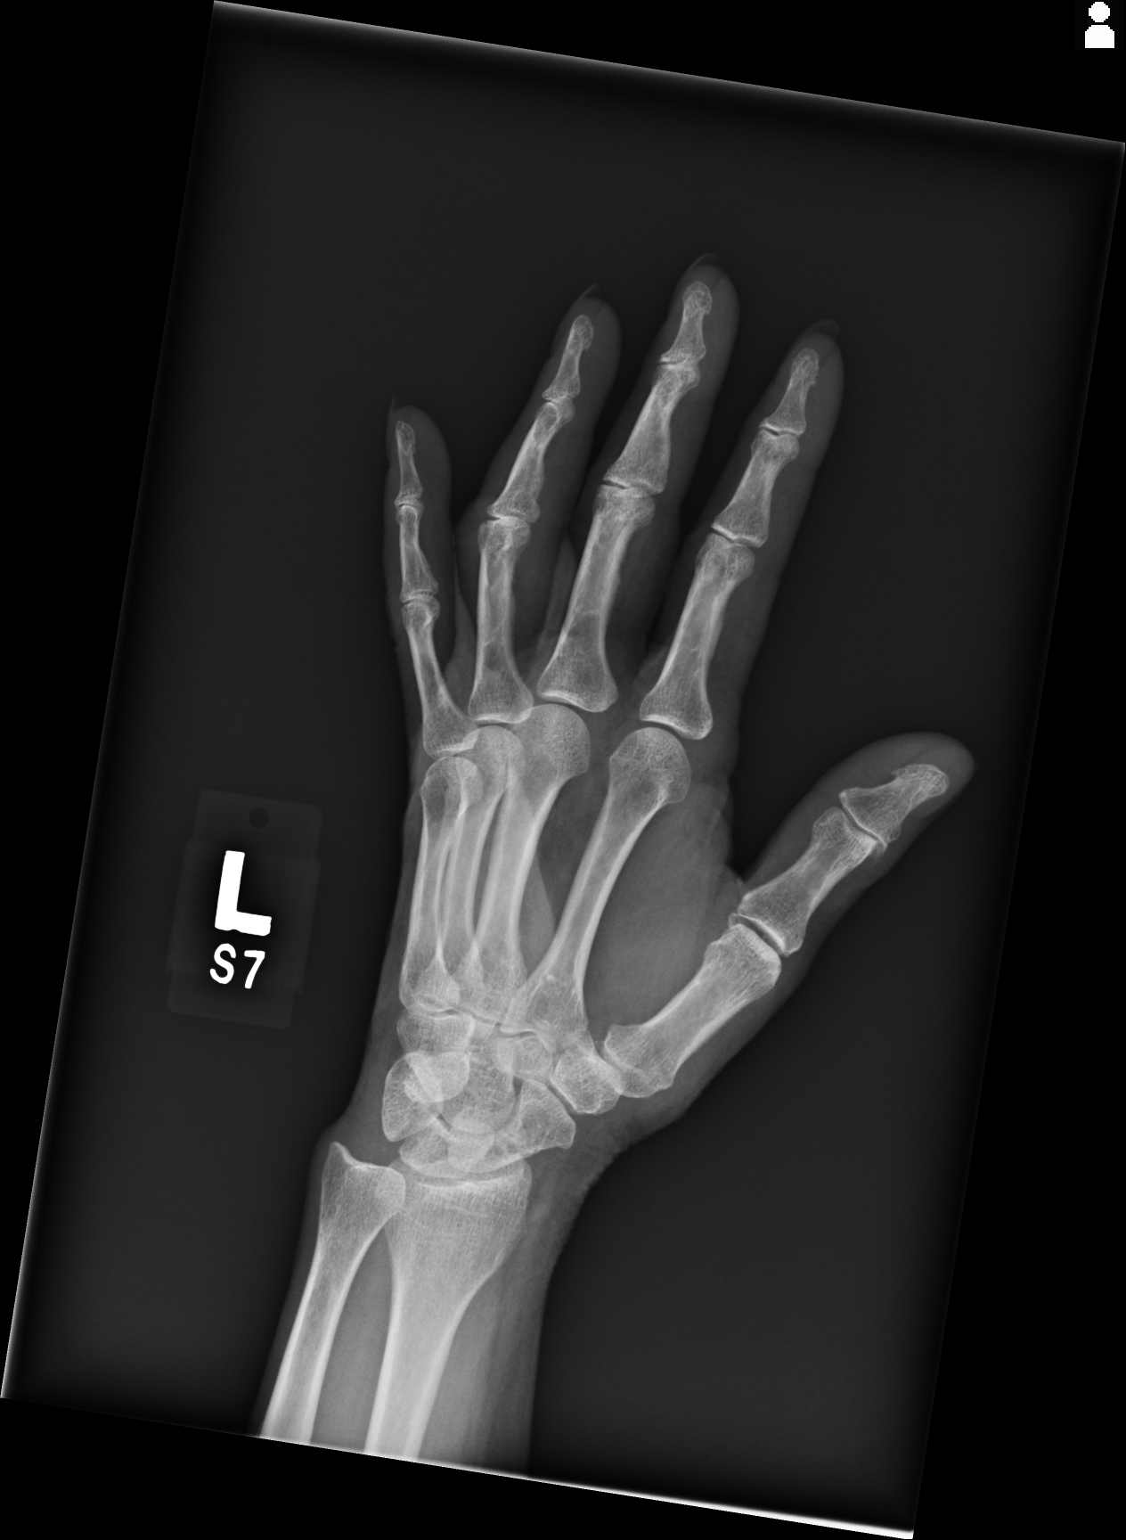
[im 3/3]
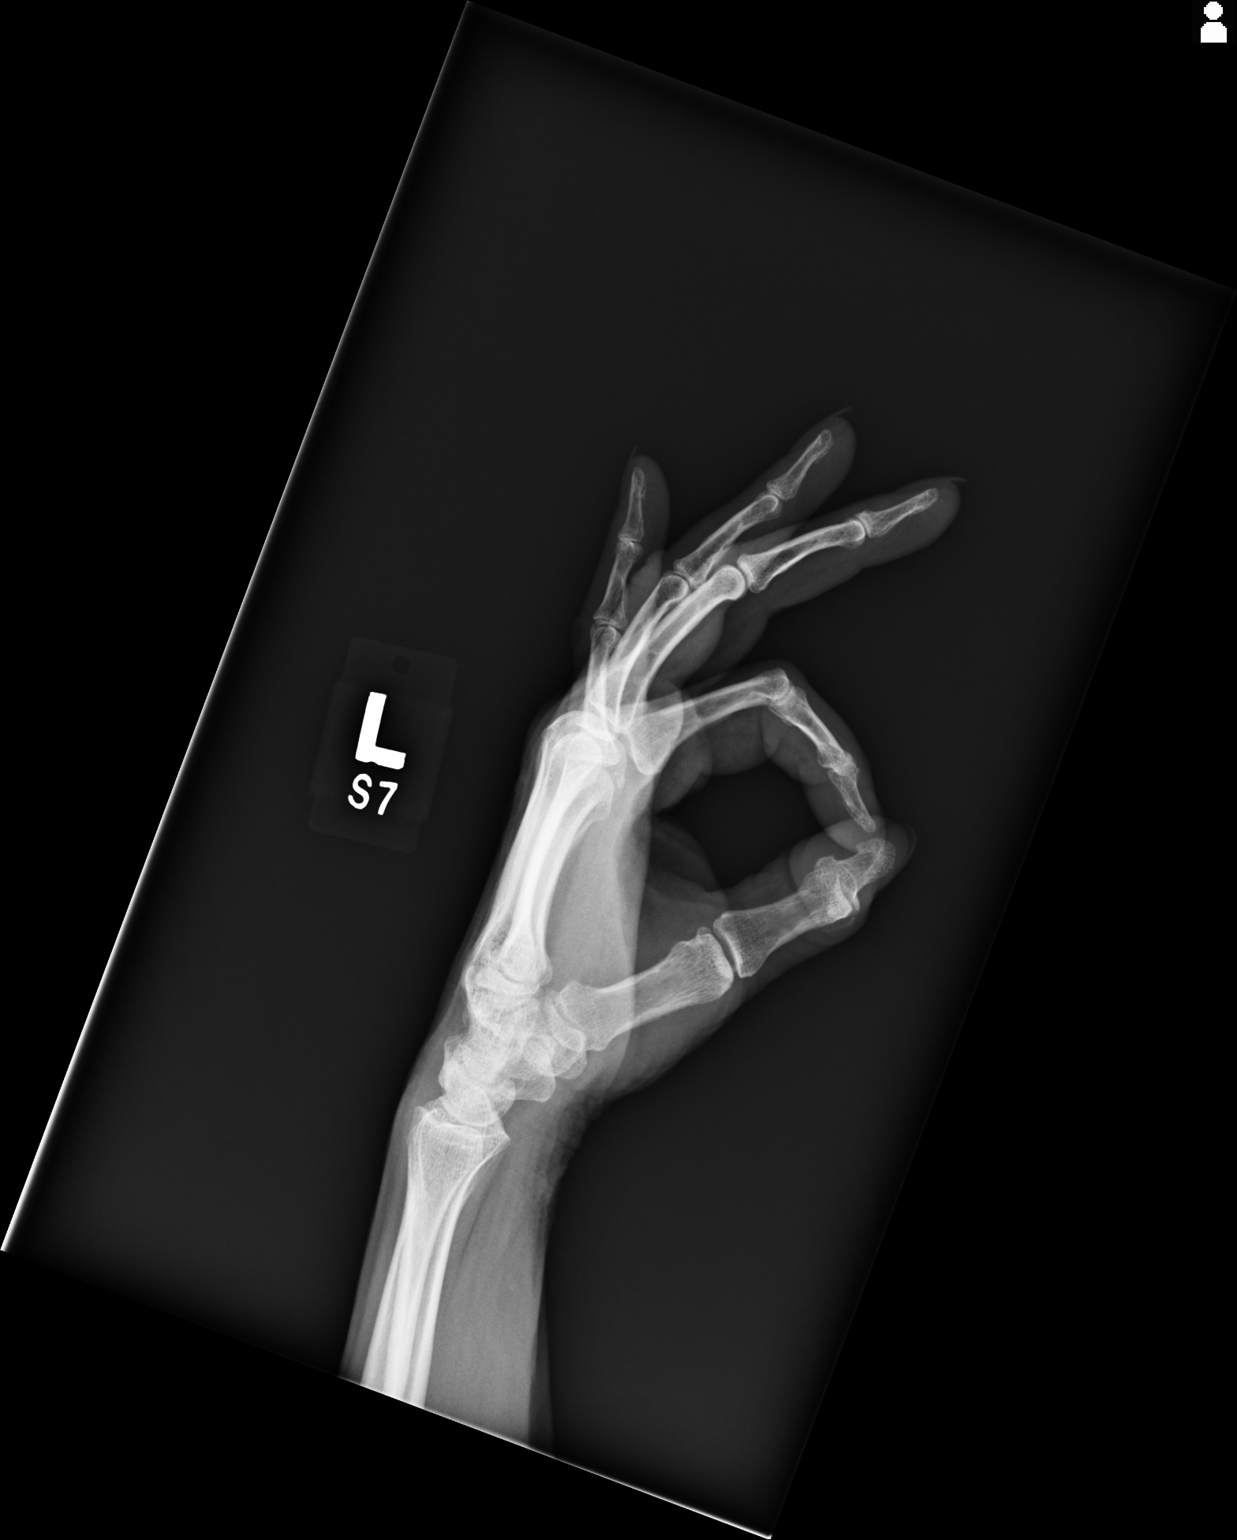

[3 of 3 positions shown; findings below may reference images not displayed]

PROCEDURE:     DXR - DXR HAND LT COMPLETE  W/OBLIQUES  - August 06, 2012 [DATE]

RESULT:     Left hand images demonstrate first carpometacarpal degenerative
change. Mild interphalangeal degenerative narrowing is present. There is no
definite bony destruction or fracture appreciated. No bony erosion is
evident.
IMPRESSION: 1. Findings consistent with osteoarthritis. No acute bony abnormality
evident.

[REDACTED]

## 2013-03-27 IMAGING — CR DG KNEE COMPLETE 4+V*R*
1 series · 4 of 4 positions shown · non-contrast
Comparison: none

REASON FOR EXAM: fall/pain
COMMENTS:

[Series 1: ap · 0.17mm/px · 4 of 4 slices shown]
[im 1/4]
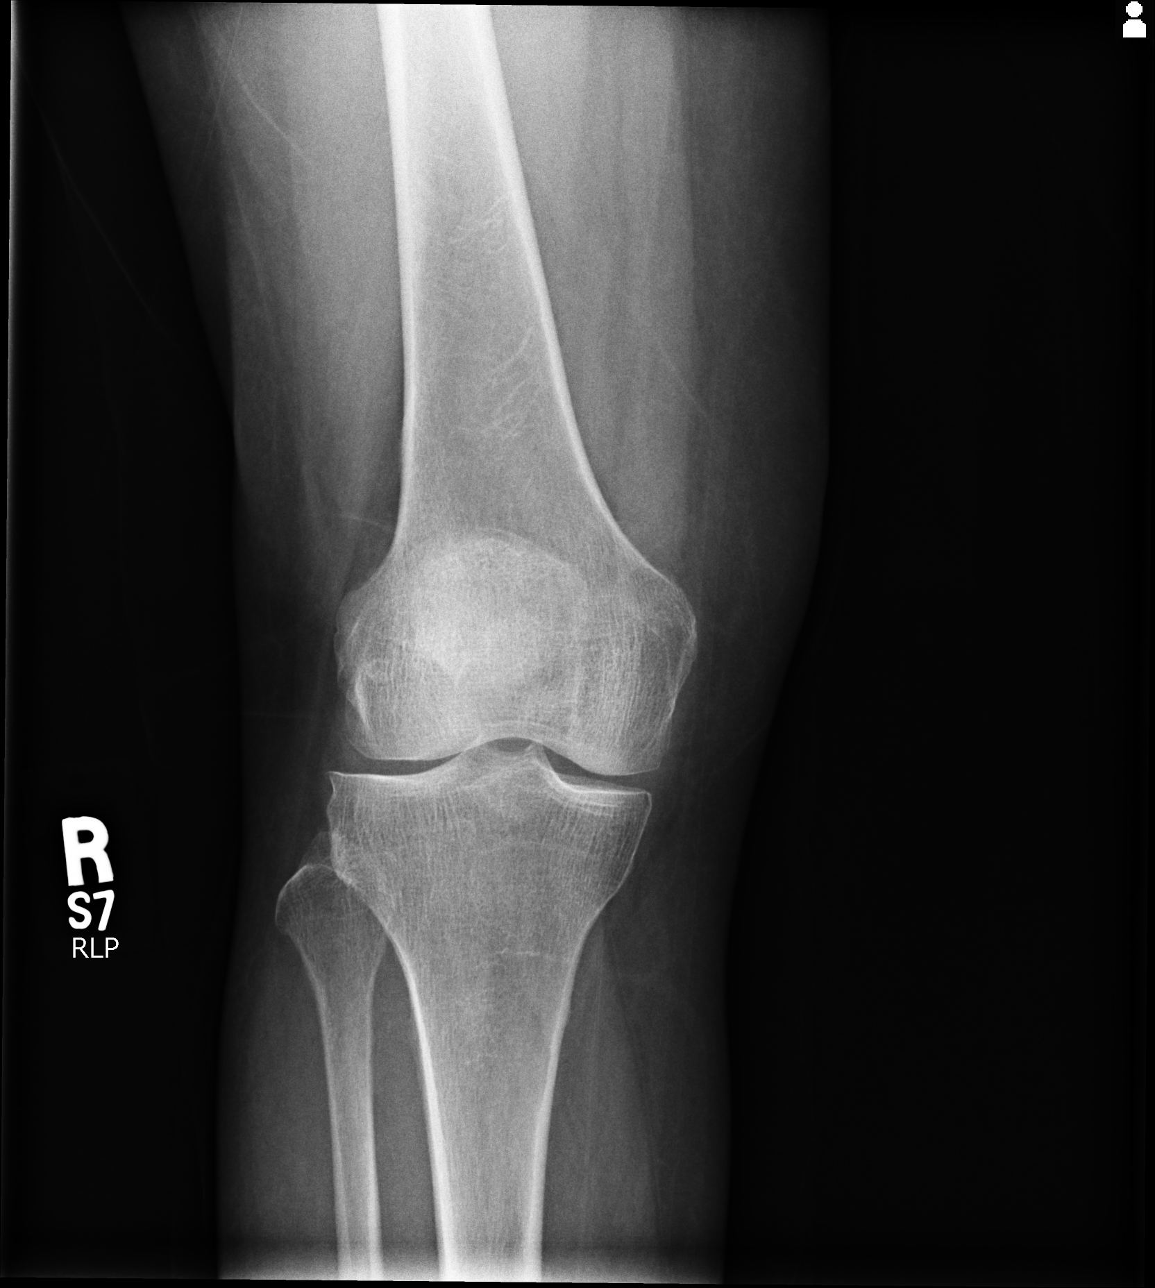
[im 2/4]
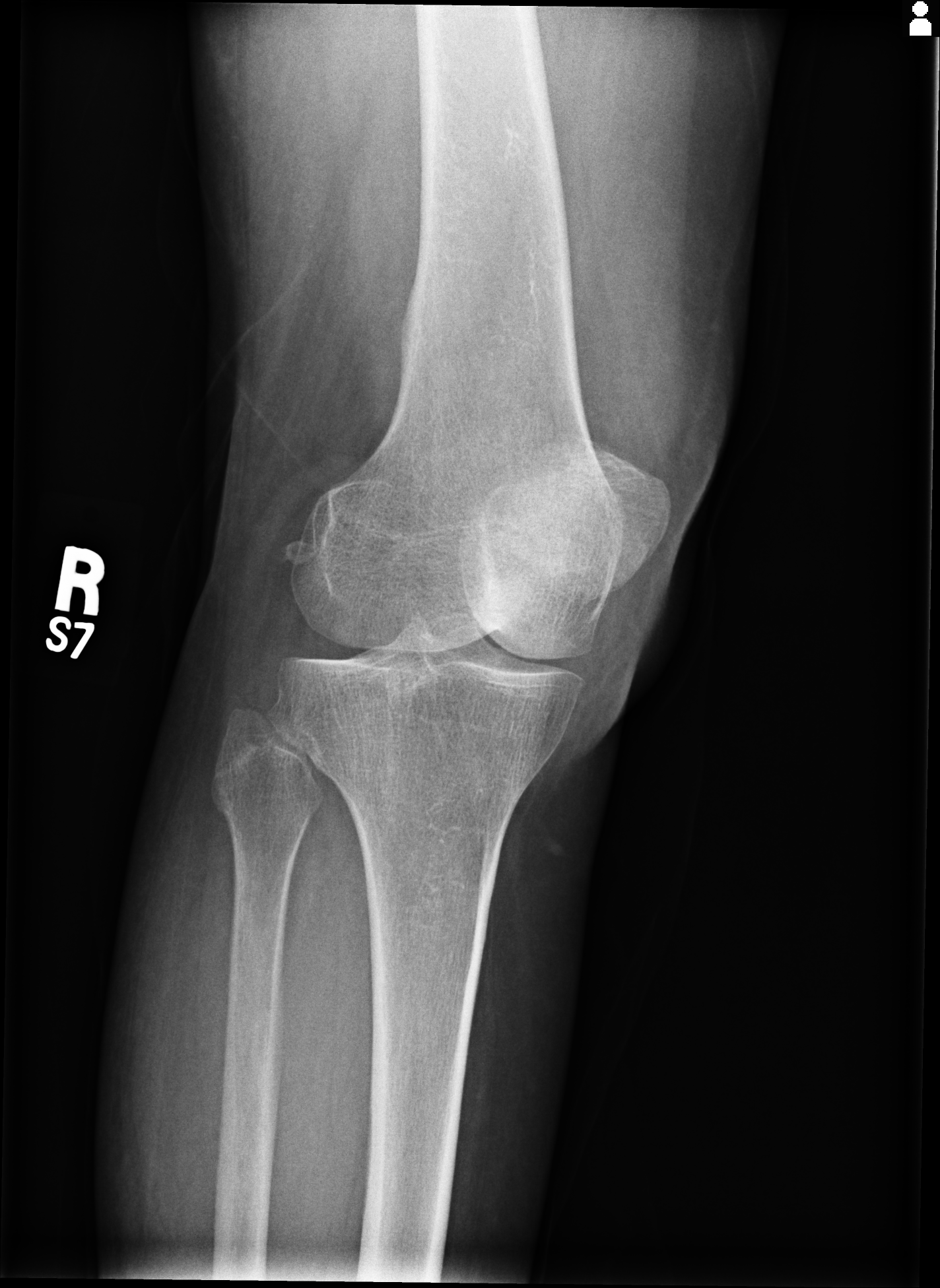
[im 3/4]
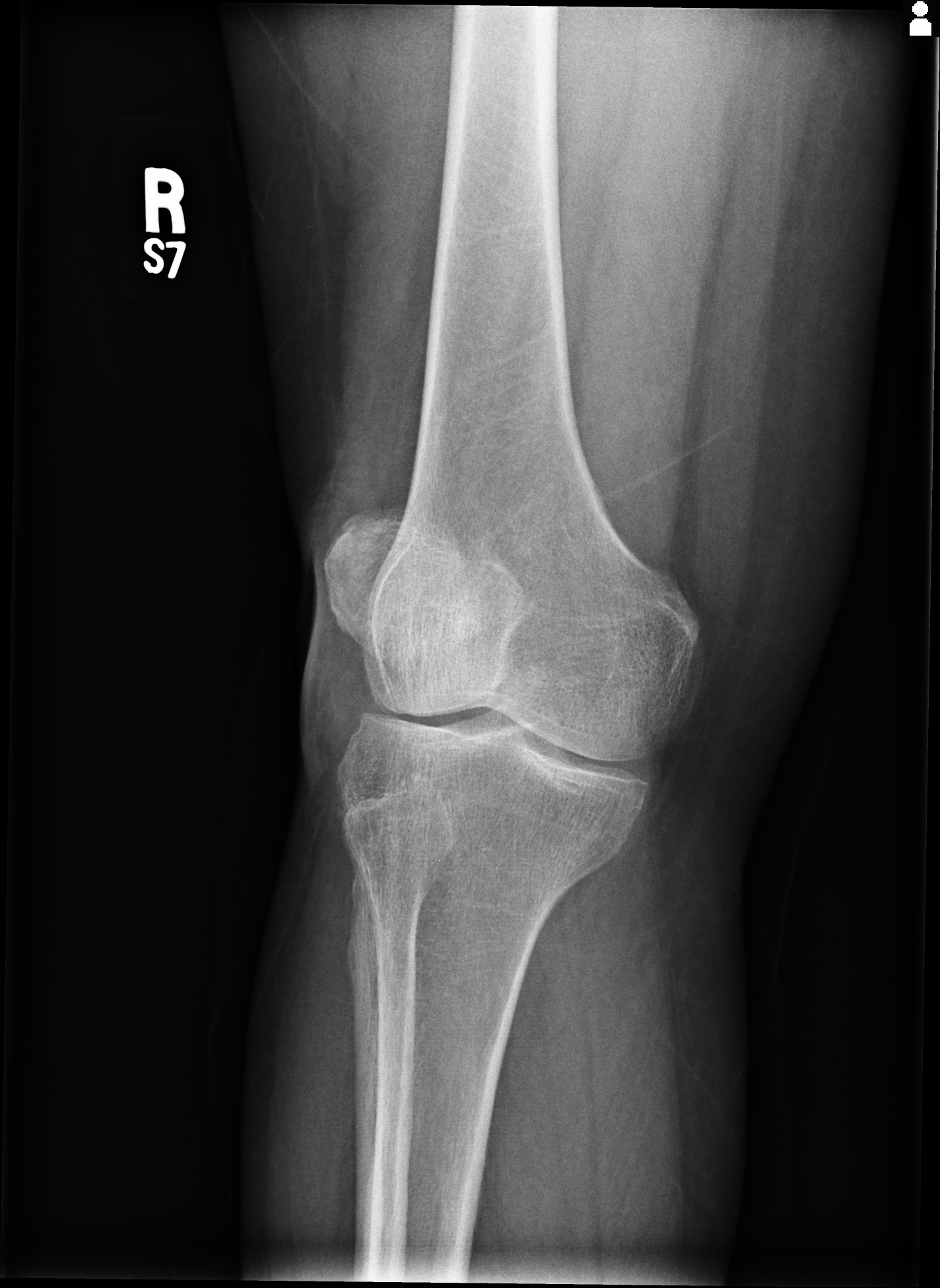
[im 4/4]
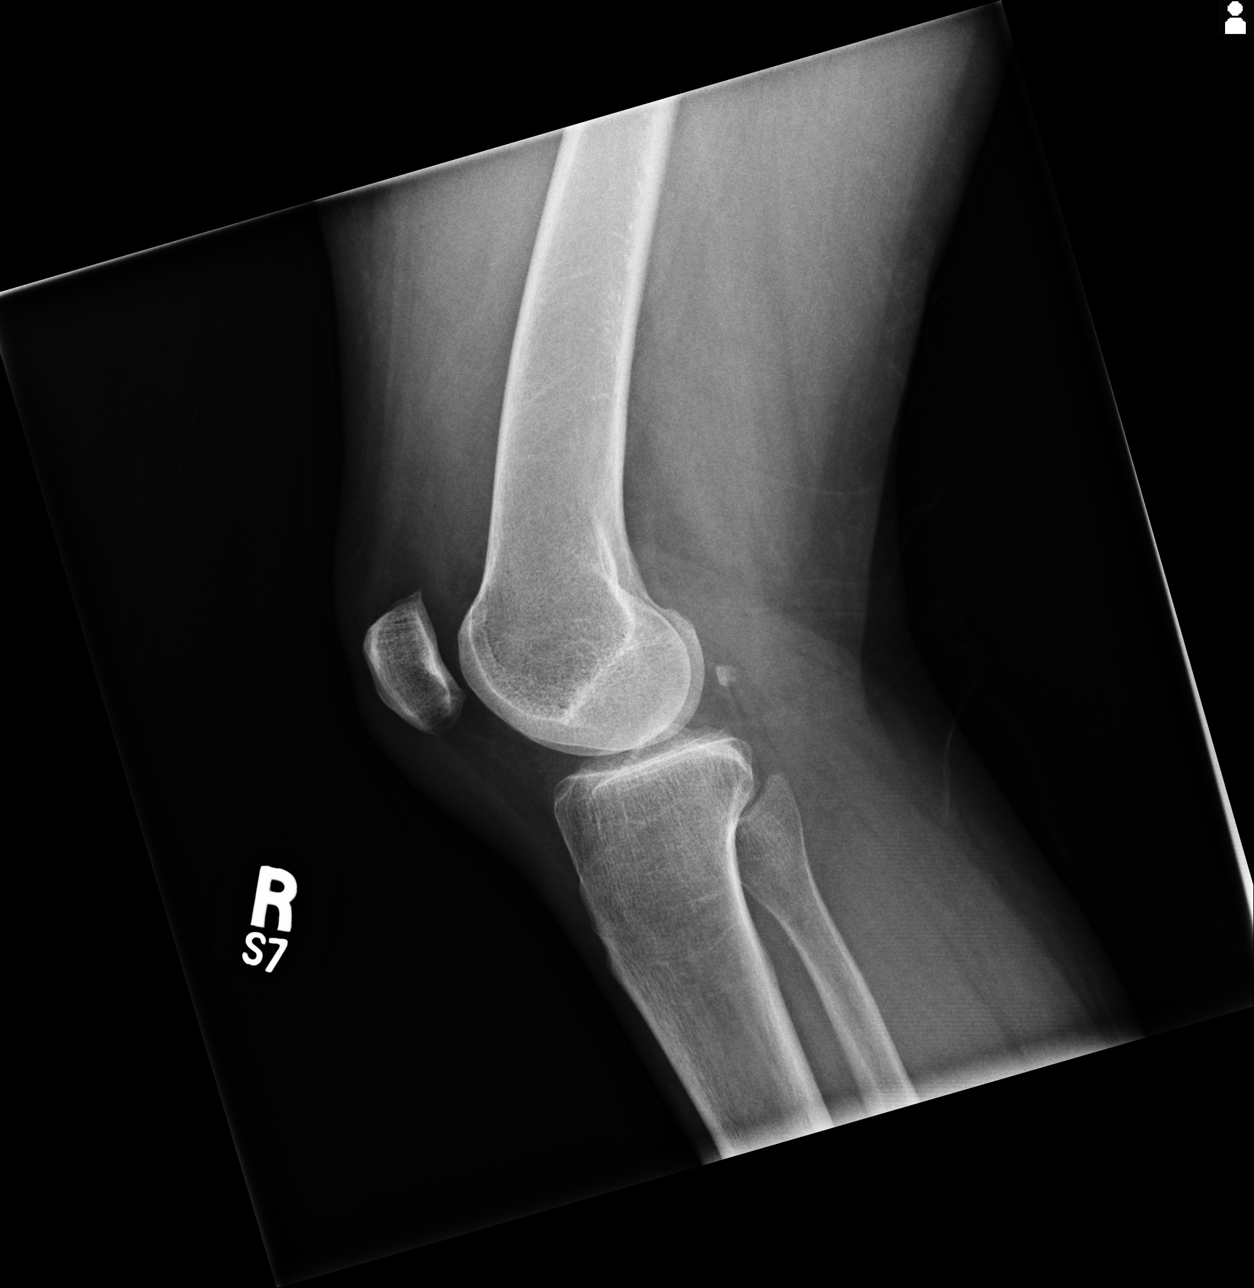

[4 of 4 positions shown; findings below may reference images not displayed]

PROCEDURE:     DXR - DXR KNEE RT COMP WITH OBLIQUES  - August 06, 2012 [DATE]

RESULT:     Right knee images are compared to previous examination dated 22 June, 2012. There is mild degenerative narrowing and marginal hypertrophic
spurring. There is no fracture, dislocation or foreign body. If internal
derangement is a clinical concern then MRI followup would be recommended.
IMPRESSION: 1. No acute bony abnormality evident.

[REDACTED]

## 2013-03-27 MED ORDER — SODIUM CHLORIDE 0.9 % IV SOLN: 500.0000 mL | INTRAVENOUS | Status: DC

## 2013-03-27 NOTE — Op Note (Signed)
[facility]  [address] [place], [address]   ENDOSCOPY PROCEDURE REPORT  PATIENT: Stephanie [name], Stephanie [name]  MR#: [id number] BIRTHDATE: Dec 26, 1950 , 26  yrs. old GENDER: Female ENDOSCOPIST: Inda Castle, MD REFERRED BY:  [name], M.D. PROCEDURE DATE:  03/27/2013 PROCEDURE:  EGD, diagnostic ASA CLASS:     Class III INDICATIONS:  Screening for varices. MEDICATIONS: MAC sedation, administered by CRNA, propofol (Diprivan) 152m IV, and Simethicone 0.6cc PO TOPICAL ANESTHETIC: Cetacaine Spray  DESCRIPTION OF PROCEDURE: After the risks benefits and alternatives of the procedure were thoroughly explained, informed consent was obtained.  The LB [id number] was introduced through the mouth and advanced to the third portion of the duodenum. Without limitations.  The instrument was slowly withdrawn as the mucosa was fully examined.      The upper, middle and distal third of the esophagus were carefully inspected and no abnormalities were noted.  The z-line was well seen at the GEJ.  The endoscope was pushed into the fundus which was normal including a retroflexed view.  The antrum, gastric body, first and second part of the duodenum were unremarkable. Retroflexed views revealed no abnormalities.     The scope was then withdrawn from the patient and the procedure completed.  COMPLICATIONS: There were no complications. ENDOSCOPIC IMPRESSION: Normal EGD  RECOMMENDATIONS: 1.  Capsule endoscopy 2.  Endoscopy 1 year 3.  f/u OV  REPEAT EXAM:  eSigned:  RInda Castle MD 03/27/2013 11:08 AM   CC:

## 2013-03-27 NOTE — Patient Instructions (Addendum)
Normal EGD exam today. Dr.Kaplan's office will call you to set up office visit, and capsule endoscopy.  Repeat EGD in 1 year. Resume current medications.  Call us with any questions or concerns. Thank you!!  YOU HAD AN ENDOSCOPIC PROCEDURE TODAY AT [facility]: Refer to the procedure report that was given to you for any specific questions about what was found during the examination.  If the procedure report does not answer your questions, please call your gastroenterologist to clarify.  If you requested that your care partner not be given the details of your procedure findings, then the procedure report has been included in a sealed envelope for you to review at your convenience later.  YOU SHOULD EXPECT: Some feelings of bloating in the abdomen. Passage of more gas than usual.  Walking can help get rid of the air that was put into your GI tract during the procedure and reduce the bloating. If you had a lower endoscopy (such as a colonoscopy or flexible sigmoidoscopy) you may notice spotting of blood in your stool or on the toilet paper. If you underwent a bowel prep for your procedure, then you may not have a normal bowel movement for a few days.  DIET: Your first meal following the procedure should be a light meal and then it is ok to progress to your normal diet.  A half-sandwich or bowl of soup is an example of a good first meal.  Heavy or fried foods are harder to digest and may make you feel nauseous or bloated.  Likewise meals heavy in dairy and vegetables can cause extra gas to form and this can also increase the bloating.  Drink plenty of fluids but you should avoid alcoholic beverages for 24 hours.  ACTIVITY: Your care partner should take you home directly after the procedure.  You should plan to take it easy, moving slowly for the rest of the day.  You can resume normal activity the day after the procedure however you should NOT DRIVE or use heavy machinery for 24 hours (because  of the sedation medicines used during the test).    SYMPTOMS TO REPORT IMMEDIATELY: A gastroenterologist can be reached at any hour.  During normal business hours, 8:30 AM to 5:00 PM Monday through Friday, call [phone].  After hours and on weekends, please call the GI answering service at [phone] who will take a message and have the physician on call contact you.   Following lower endoscopy (colonoscopy or flexible sigmoidoscopy):  Excessive amounts of blood in the stool  Significant tenderness or worsening of abdominal pains  Swelling of the abdomen that is new, acute  Fever of 100F or higher  Following upper endoscopy (EGD)  Vomiting of blood or coffee ground material  New chest pain or pain under the shoulder blades  Painful or persistently difficult swallowing  New shortness of breath  Fever of 100F or higher  Black, tarry-looking stools  FOLLOW UP: If any biopsies were taken you will be contacted by phone or by letter within the next 1-3 weeks.  Call your gastroenterologist if you have not heard about the biopsies in 3 weeks.  Our staff will call the home number listed on your records the next business day following your procedure to check on you and address any questions or concerns that you may have at that time regarding the information given to you following your procedure. This is a courtesy call and so if there is no answer  at the home number and we have not heard from you through the emergency physician on call, we will assume that you have returned to your regular daily activities without incident.  SIGNATURES/CONFIDENTIALITY: You and/or your care partner have signed paperwork which will be entered into your electronic medical record.  These signatures attest to the fact that that the information above on your After Visit Summary has been reviewed and is understood.  Full responsibility of the confidentiality of this discharge information lies with you and/or  your care-partner.

## 2013-03-27 NOTE — Progress Notes (Signed)
Report to pacu rn, vss, bbs=clear 

## 2013-03-27 NOTE — Progress Notes (Signed)
Patient did not experience any of the following events: a burn prior to discharge; a fall within the facility; wrong site/side/patient/procedure/implant event; or a hospital transfer or hospital admission upon discharge from the facility. (G8907) Patient did not have preoperative order for IV antibiotic SSI prophylaxis. (G8918)  

## 2013-03-28 ENCOUNTER — Other Ambulatory Visit: Payer: Self-pay | Admitting: Family Medicine

## 2013-03-28 ENCOUNTER — Telehealth: Payer: Self-pay | Admitting: *Deleted

## 2013-03-28 NOTE — Telephone Encounter (Signed)
  Follow up Call-  Call back number 03/27/2013 02/07/2012  Post procedure Call Back phone  # [phone]  Permission to leave phone message Yes Yes     Patient questions:  Do you have a fever, pain , or abdominal swelling? no Pain Score  0 *  Have you tolerated food without any problems? yes  Have you been able to return to your normal activities? yes  Do you have any questions about your discharge instructions: Diet   no Medications  no Follow up visit  no  Do you have questions or concerns about your Care? no  Actions: * If pain score is 4 or above: No action needed, pain <4.

## 2013-04-02 ENCOUNTER — Telehealth: Payer: Self-pay | Admitting: Gastroenterology

## 2013-04-02 ENCOUNTER — Other Ambulatory Visit: Payer: Self-pay | Admitting: Family Medicine

## 2013-04-02 NOTE — Telephone Encounter (Signed)
Pt states that her bowel movements are yellowish tan in color and she saw some mucous in the stool. Pt thinks she may have an infection and wanted an antibiotic called in. Discussed with pt that her bowels were probably not back to normal from her procedure and to call us back if her bowels did not return to normal. Pt is not running a fever. She verbalized understanding.

## 2013-04-02 NOTE — Telephone Encounter (Signed)
[facility] electronically request refill tramadol.Please advise.

## 2013-04-03 ENCOUNTER — Other Ambulatory Visit: Payer: Self-pay | Admitting: Family Medicine

## 2013-04-03 ENCOUNTER — Inpatient Hospital Stay: Payer: Self-pay | Admitting: Internal Medicine

## 2013-04-03 DIAGNOSIS — G8929 Other chronic pain: Secondary | ICD-10-CM | POA: Diagnosis not present

## 2013-04-03 DIAGNOSIS — Z800 Family history of malignant neoplasm of digestive organs: Secondary | ICD-10-CM | POA: Diagnosis not present

## 2013-04-03 DIAGNOSIS — Z833 Family history of diabetes mellitus: Secondary | ICD-10-CM | POA: Diagnosis not present

## 2013-04-03 DIAGNOSIS — F319 Bipolar disorder, unspecified: Secondary | ICD-10-CM | POA: Diagnosis present

## 2013-04-03 DIAGNOSIS — J9600 Acute respiratory failure, unspecified whether with hypoxia or hypercapnia: Secondary | ICD-10-CM | POA: Diagnosis not present

## 2013-04-03 DIAGNOSIS — M549 Dorsalgia, unspecified: Secondary | ICD-10-CM | POA: Diagnosis present

## 2013-04-03 DIAGNOSIS — E669 Obesity, unspecified: Secondary | ICD-10-CM | POA: Diagnosis present

## 2013-04-03 DIAGNOSIS — E119 Type 2 diabetes mellitus without complications: Secondary | ICD-10-CM | POA: Diagnosis present

## 2013-04-03 DIAGNOSIS — J441 Chronic obstructive pulmonary disease with (acute) exacerbation: Secondary | ICD-10-CM | POA: Diagnosis not present

## 2013-04-03 DIAGNOSIS — R0902 Hypoxemia: Secondary | ICD-10-CM | POA: Diagnosis not present

## 2013-04-03 DIAGNOSIS — F17200 Nicotine dependence, unspecified, uncomplicated: Secondary | ICD-10-CM | POA: Diagnosis not present

## 2013-04-03 DIAGNOSIS — R0602 Shortness of breath: Secondary | ICD-10-CM | POA: Diagnosis not present

## 2013-04-03 DIAGNOSIS — F329 Major depressive disorder, single episode, unspecified: Secondary | ICD-10-CM | POA: Diagnosis not present

## 2013-04-03 DIAGNOSIS — F411 Generalized anxiety disorder: Secondary | ICD-10-CM | POA: Diagnosis not present

## 2013-04-03 DIAGNOSIS — Z8711 Personal history of peptic ulcer disease: Secondary | ICD-10-CM | POA: Diagnosis not present

## 2013-04-03 DIAGNOSIS — R0609 Other forms of dyspnea: Secondary | ICD-10-CM | POA: Diagnosis not present

## 2013-04-03 DIAGNOSIS — K219 Gastro-esophageal reflux disease without esophagitis: Secondary | ICD-10-CM | POA: Diagnosis present

## 2013-04-03 DIAGNOSIS — Z801 Family history of malignant neoplasm of trachea, bronchus and lung: Secondary | ICD-10-CM | POA: Diagnosis not present

## 2013-04-03 DIAGNOSIS — Z803 Family history of malignant neoplasm of breast: Secondary | ICD-10-CM | POA: Diagnosis not present

## 2013-04-03 DIAGNOSIS — R079 Chest pain, unspecified: Secondary | ICD-10-CM | POA: Diagnosis not present

## 2013-04-03 DIAGNOSIS — J9620 Acute and chronic respiratory failure, unspecified whether with hypoxia or hypercapnia: Secondary | ICD-10-CM | POA: Diagnosis not present

## 2013-04-03 DIAGNOSIS — J189 Pneumonia, unspecified organism: Secondary | ICD-10-CM | POA: Diagnosis not present

## 2013-04-03 LAB — BASIC METABOLIC PANEL
Anion Gap: 4 — ABNORMAL LOW (ref 7–16)
BUN: 9 mg/dL (ref 7–18)
Chloride: 104 mmol/L (ref 98–107)
Co2: 27 mmol/L (ref 21–32)
Creatinine: 0.94 mg/dL (ref 0.60–1.30)
EGFR (African American): >60
EGFR (Non-African Amer.): >60
Glucose: 110 mg/dL — ABNORMAL HIGH (ref 65–99)
Osmolality: 269 (ref 275–301)
Potassium: 4.1 mmol/L (ref 3.5–5.1)

## 2013-04-03 LAB — CBC
HGB: 13.8 g/dL (ref 12.0–16.0)
MCHC: 34.7 g/dL (ref 32.0–36.0)
MCV: 89 fL (ref 80–100)
Platelet: 187 10*3/uL (ref 150–440)
RBC: 4.45 10*6/uL (ref 3.80–5.20)
RDW: 15 % — ABNORMAL HIGH (ref 11.5–14.5)
WBC: 11.2 10*3/uL — ABNORMAL HIGH (ref 3.6–11.0)

## 2013-04-03 LAB — TROPONIN I: Troponin-I: <0.02 ng/mL

## 2013-04-03 LAB — PRO B NATRIURETIC PEPTIDE: B-Type Natriuretic Peptide: 188 pg/mL — ABNORMAL HIGH (ref 0–125)

## 2013-04-03 NOTE — Telephone Encounter (Signed)
Ok to refill on or after 10/30

## 2013-04-03 NOTE — Telephone Encounter (Signed)
Call in on or after 10/30

## 2013-04-03 NOTE — Telephone Encounter (Signed)
Received refill request electronically. Last refill #120 on 02/02/13. Last office visit 01/02/13. Is it okay to refill medication?

## 2013-04-04 LAB — CBC WITH DIFFERENTIAL/PLATELET
Basophil %: 0.3 %
Eosinophil %: 0 %
HCT: 39.2 % (ref 35.0–47.0)
HGB: 13.3 g/dL (ref 12.0–16.0)
Lymphocyte #: 1 10*3/uL (ref 1.0–3.6)
MCHC: 33.8 g/dL (ref 32.0–36.0)
MCV: 91 fL (ref 80–100)
Monocyte #: 0.1 x10 3/mm — ABNORMAL LOW (ref 0.2–0.9)
Monocyte %: 1.5 %
RBC: 4.3 10*6/uL (ref 3.80–5.20)
WBC: 9.2 10*3/uL (ref 3.6–11.0)

## 2013-04-04 LAB — BASIC METABOLIC PANEL
BUN: 11 mg/dL (ref 7–18)
Calcium, Total: 9.4 mg/dL (ref 8.5–10.1)
Chloride: 103 mmol/L (ref 98–107)
Creatinine: 1.31 mg/dL — ABNORMAL HIGH (ref 0.60–1.30)
EGFR (Non-African Amer.): 44 — ABNORMAL LOW
Potassium: 3.4 mmol/L — ABNORMAL LOW (ref 3.5–5.1)

## 2013-04-04 LAB — LIPID PANEL
Cholesterol: 230 mg/dL — ABNORMAL HIGH (ref 0–200)
Ldl Cholesterol, Calc: 111 mg/dL — ABNORMAL HIGH (ref 0–100)

## 2013-04-04 NOTE — Telephone Encounter (Signed)
Called in refill this morning 10/30...ds,cma

## 2013-04-04 NOTE — Telephone Encounter (Signed)
She should already have a refill. On 9/30 she was given #120 with refill. Should not be due until 11/30

## 2013-04-08 ENCOUNTER — Other Ambulatory Visit: Payer: Self-pay | Admitting: Family Medicine

## 2013-04-08 LAB — CULTURE, BLOOD (SINGLE)

## 2013-04-09 ENCOUNTER — Encounter: Payer: Self-pay | Admitting: Gastroenterology

## 2013-04-09 NOTE — Progress Notes (Signed)
Patient ID: Stephanie [name], female   DOB: [birth year], 62 y.o.   MRN: [id number] Patient arrived to day for Capsule Endoscopy teaching.  Discussed with Dr. Deatra Ina, he ordered this on the procedure note 03/27/13 in error.  Patient does not need a capsule endoscopy.  I have explained to the patient that this was ordered in error and apologized for her inconvenience.

## 2013-04-12 ENCOUNTER — Encounter: Payer: Self-pay | Admitting: Family Medicine

## 2013-04-12 ENCOUNTER — Encounter: Payer: Self-pay | Admitting: Internal Medicine

## 2013-04-12 ENCOUNTER — Ambulatory Visit (INDEPENDENT_AMBULATORY_CARE_PROVIDER_SITE_OTHER): Payer: Medicare Other | Admitting: Internal Medicine

## 2013-04-12 ENCOUNTER — Telehealth: Payer: Self-pay

## 2013-04-12 VITALS — BP 108/72 | HR 85 | Temp 97.6°F | Wt 194.2 lb

## 2013-04-12 DIAGNOSIS — R7309 Other abnormal glucose: Secondary | ICD-10-CM

## 2013-04-12 DIAGNOSIS — R911 Solitary pulmonary nodule: Secondary | ICD-10-CM | POA: Diagnosis not present

## 2013-04-12 DIAGNOSIS — J441 Chronic obstructive pulmonary disease with (acute) exacerbation: Secondary | ICD-10-CM | POA: Diagnosis not present

## 2013-04-12 DIAGNOSIS — R739 Hyperglycemia, unspecified: Secondary | ICD-10-CM

## 2013-04-12 LAB — HEMOGLOBIN A1C: Hgb A1c MFr Bld: 6.3 % (ref 4.6–6.5)

## 2013-04-12 NOTE — Progress Notes (Signed)
Pre-visit discussion using our clinic review tool. No additional management support is needed unless otherwise documented below in the visit note.  

## 2013-04-12 NOTE — Patient Instructions (Signed)
Smoking Cessation, Tips for Success YOU CAN QUIT SMOKING If you are ready to quit smoking, congratulations! You have chosen to help yourself be healthier. Cigarettes bring nicotine, tar, carbon monoxide, and other irritants into your body. Your lungs, heart, and blood vessels will be able to work better without these poisons. There are many different ways to quit smoking. Nicotine gum, nicotine patches, a nicotine inhaler, or nicotine nasal spray can help with physical craving. Hypnosis, support groups, and medicines help break the habit of smoking. Here are some tips to help you quit for good.  Throw away all cigarettes.  Clean and remove all ashtrays from your home, work, and car.  On a card, write down your reasons for quitting. Carry the card with you and read it when you get the urge to smoke.  Cleanse your body of nicotine. Drink enough water and fluids to keep your urine clear or pale yellow. Do this after quitting to flush the nicotine from your body.  Learn to predict your moods. Do not let a bad situation be your excuse to have a cigarette. Some situations in your life might tempt you into wanting a cigarette.  Never have "just one" cigarette. It leads to wanting another and another. Remind yourself of your decision to quit.  Change habits associated with smoking. If you smoked while driving or when feeling stressed, try other activities to replace smoking. Stand up when drinking your coffee. Brush your teeth after eating. Sit in a different chair when you read the paper. Avoid alcohol while trying to quit, and try to drink fewer caffeinated beverages. Alcohol and caffeine may urge you to smoke.  Avoid foods and drinks that can trigger a desire to smoke, such as sugary or spicy foods and alcohol.  Ask people who smoke not to smoke around you.  Have something planned to do right after eating or having a cup of coffee. Take a walk or exercise to perk you up. This will help to keep you  from overeating.  Try a relaxation exercise to calm you down and decrease your stress. Remember, you may be tense and nervous for the first 2 weeks after you quit, but this will pass.  Find new activities to keep your hands busy. Play with a pen, coin, or rubber band. Doodle or draw things on paper.  Brush your teeth right after eating. This will help cut down on the craving for the taste of tobacco after meals. You can try mouthwash, too.  Use oral substitutes, such as lemon drops, carrots, a cinnamon stick, or chewing gum, in place of cigarettes. Keep them handy so they are available when you have the urge to smoke.  When you have the urge to smoke, try deep breathing.  Designate your home as a nonsmoking area.  If you are a heavy smoker, ask your caregiver about a prescription for nicotine chewing gum. It can ease your withdrawal from nicotine.  Reward yourself. Set aside the cigarette money you save and buy yourself something nice.  Look for support from others. Join a support group or smoking cessation program. Ask someone at home or at work to help you with your plan to quit smoking.  Always ask yourself, "Do I need this cigarette or is this just a reflex?" Tell yourself, "Today, I choose not to smoke," or "I do not want to smoke." You are reminding yourself of your decision to quit, even if you do smoke a cigarette. HOW WILL I FEEL WHEN  I QUIT SMOKING?  The benefits of not smoking start within days of quitting.  You may have symptoms of withdrawal because your body is used to nicotine (the addictive substance in cigarettes). You may crave cigarettes, be irritable, feel very hungry, cough often, get headaches, or have difficulty concentrating.  The withdrawal symptoms are only temporary. They are strongest when you first quit but will go away within 10 to 14 days.  When withdrawal symptoms occur, stay in control. Think about your reasons for quitting. Remind yourself that these are  signs that your body is healing and getting used to being without cigarettes.  Remember that withdrawal symptoms are easier to treat than the major diseases that smoking can cause.  Even after the withdrawal is over, expect periodic urges to smoke. However, these cravings are generally short-lived and will go away whether you smoke or not. Do not smoke!  If you relapse and smoke again, do not lose hope. Most smokers quit 3 times before they are successful.  If you relapse, do not give up! Plan ahead and think about what you will do the next time you get the urge to smoke. LIFE AS A NONSMOKER: MAKE IT FOR A MONTH, MAKE IT FOR LIFE Day 1: Hang this page where you will see it every day. Day 2: Get rid of all ashtrays, matches, and lighters. Day 3: Drink water. Breathe deeply between sips. Day 4: Avoid places with smoke-filled air, such as bars, clubs, or the smoking section of restaurants. Day 5: Keep track of how much money you save by not smoking. Day 6: Avoid boredom. Keep a good book with you or go to the movies. Day 7: Reward yourself! One week without smoking! Day 8: Make a dental appointment to get your teeth cleaned. Day 9: Decide how you will turn down a cigarette before it is offered to you. Day 10: Review your reasons for quitting. Day 11: Distract yourself. Stay active to keep your mind off smoking and to relieve tension. Take a walk, exercise, read a book, do a crossword puzzle, or try a new hobby. Day 12: Exercise. Get off the bus before your stop or use stairs instead of escalators. Day 13: Call on friends for support and encouragement. Day 14: Reward yourself! Two weeks without smoking! Day 15: Practice deep breathing exercises. Day 16: Bet a friend that you can stay a nonsmoker. Day 17: Ask to sit in nonsmoking sections of restaurants. Day 18: Hang up "No Smoking" signs. Day 19: Think of yourself as a nonsmoker. Day 20: Each morning, tell yourself you will not smoke. Day  21: Reward yourself! Three weeks without smoking! Day 22: Think of smoking in negative ways. Remember how it stains your teeth, gives you bad breath, and leaves you short of breath. Day 23: Eat a nutritious breakfast. Day 24:Do not relive your days as a smoker. Day 25: Hold a pencil in your hand when talking on the telephone. Day 26: Tell all your friends you do not smoke. Day 27: Think about how much better food tastes. Day 28: Remember, one cigarette is one too many. Day 29: Take up a hobby that will keep your hands busy. Day 30: Congratulations! One month without smoking! Give yourself a big reward. Your caregiver can direct you to community resources or hospitals for support, which may include:  Group support.  Education.  Hypnosis.  Subliminal therapy. Document Released: 02/19/2004 Document Revised: 08/15/2011 Document Reviewed: 11/08/2012 [facility] Patient Information 2014 [place], [place].

## 2013-04-12 NOTE — Progress Notes (Signed)
Subjective:    Patient ID: Stephanie [name], female    DOB: 12-16-1950, 62 y.o.   MRN: [id number]  HPI  Pt presents to the clinic today to f/u [facility] ER visit. She went for fever, tachycardia, and shortness of breath. CXR and chest CT showed COPD exacerbation and aspiration pneumonia. She does have a history of COPD and is on Spiriva and albuterol. She has been doing well. She has finished her pred pack. She has been breathing better since being at home. She did decrease her smoking to 1/2 ppd. She does have a pulmonology appt in January for evaluation of a left lobe nodule. She is concerned about her blood sugars though. They have been in the 300-400 range. She is not sure if this is related to her steroids that she was given in the hospital. Her last A1C in march was 6.6%.  Review of Systems      Past Medical History  Diagnosis Date  . Adenomatous colon polyp   . Insomnia   . RLS (restless legs syndrome)   . Colon polyp   . Incontinence   . Hepatitis B, chronic   . H/O: rheumatic fever   . Hyperlipidemia   . Unspecified disorders of nervous system   . Bipolar affective disorder     h/o  . H/O: CVA (cardiovascular accident)     TIA  . Fatty liver 04/09/08    found in abd CT  . Shortness of breath   . Obstructive sleep apnea     not using CPAP, last sleep study 2.5 years ago, per pt doctor is aware  . Blood transfusion 2000  . GERD (gastroesophageal reflux disease)   . Arthritis   . Aortic dissection     Type 1  . COPD (chronic obstructive pulmonary disease)   . Anxiety   . Depression   . Heart murmur     as a child  . Stroke     TIA- 2002  . Ulcer     Current Outpatient Prescriptions  Medication Sig Dispense Refill  . ABILIFY 30 MG tablet TAKE 1 TABLET BY MOUTH EVERY MORNING  30 tablet  0  . albuterol (PROAIR HFA) 108 (90 BASE) MCG/ACT inhaler INHALE 2 PUFFS INTO THE LUNGS EVERY 6 (SIX) HOURS AS NEEDED FOR WHEEZING.  8.5 each  3  . albuterol (PROVENTIL HFA;VENTOLIN  HFA) 108 (90 BASE) MCG/ACT inhaler INHALE 2 PUFFS INTO THE LUNGS EVERY 6 (SIX) HOURS AS NEEDED FOR WHEEZING.  8.5 each  0  . clonazePAM (KLONOPIN) 0.5 MG tablet TAKE 1 TABLET BY MOUTH TWICE A DAY AS NEEDED FOR ANXIETY  60 tablet  0  . [facility] STOOL SOFTENER 100 MG capsule TAKE 1 CAPSULE BY MOUTH 2 TIMES DAILY AS NEEDED FOR CONSTIPATION  60 capsule  3  . diazepam (VALIUM) 5 MG tablet TAKE 1 TABLET BY MOUTH TWICE A DAY AS NEEDED FOR MUSCLE SPASMS  60 tablet  0  . estradiol (ALORA) 0.1 MG/24HR Place 1 patch (0.1 mg total) onto the skin 2 (two) times a week.  8 patch  12  . fluocinonide-emollient (LIDEX-E) 0.05 % cream Apply topically 2 (two) times daily.  30 g  0  . lamoTRIgine (LAMICTAL) 100 MG tablet Take 3 tablets by mouth once daily      . LIDODERM 5 % PLACE 1 PATCH ONTO THE SKIN DAILY. REMOVE & DISCARD PATCH WITHIN 12 HOURS OR AS DIRECTED BY MD  30 patch  0  . metFORMIN (GLUCOPHAGE-XR)  500 MG 24 hr tablet TAKE 1 TABLET (500 MG TOTAL) BY MOUTH DAILY WITH BREAKFAST.  30 tablet  5  . mirtazapine (REMERON) 15 MG tablet Take 7.5 mg by mouth at bedtime.       Marland Kitchen nystatin (MYCOSTATIN/NYSTOP) 100000 UNIT/GM POWD APPLY TOPICALLY 2 TIMES DAILY AS DIRECTED  30 g  0  . omeprazole (PRILOSEC) 40 MG capsule TAKE 1 CAPSULE BY MOUTH DAILY.  90 capsule  1  . promethazine (PHENERGAN) 25 MG tablet TAKE 1/2 TO 1 TABLET BY MOUTH EVERY 8 HOURS AS NEEDED FOR NAUSEA  30 tablet  0  . QUEtiapine (SEROQUEL) 400 MG tablet 300 mg.       . rOPINIRole (REQUIP) 3 MG tablet TAKE 1 TABLET BY MOUTH AT BEDTIME  30 tablet  0  . salicylic acid 6 % gel APPLY TO AFFECTED AREA EVERY DAY *USE AT NIGHT AND RINSE OFF IN THE MORNING*  40 g  0  . tiotropium (SPIRIVA HANDIHALER) 18 MCG inhalation capsule Place 1 capsule (18 mcg total) into inhaler and inhale daily.  30 capsule  12  . tolterodine (DETROL LA) 2 MG 24 hr capsule TAKE ONE CAPSULE BY MOUTH DAILY  30 capsule  5  . traMADol (ULTRAM) 50 MG tablet TAKE 2 TABLETS BY MOUTH EVERY MORNING AND  TAKE 2 TABLETS EVERY EVENING AS NEEDED FOR PAIN  120 tablet  1  . urea (CARMOL) 40 % CREA Apply nightly to feet      . vitamin D, CHOLECALCIFEROL, 400 UNITS tablet Take two by mouth daily  6 tablet  6  . zolpidem (AMBIEN) 10 MG tablet Take 10 mg by mouth at bedtime as needed. For sleep       No current facility-administered medications for this visit.    No Known Allergies  Family History  Problem Relation Age of Onset  . Colon cancer Neg Hx   . Esophageal cancer Neg Hx   . Rectal cancer Neg Hx   . Breast cancer Mother   . Lung cancer Mother   . Stomach cancer Father   . Diabetes      4 aunts, and 1 uncle    History   Social History  . Marital Status: Divorced    Spouse Name: N/A    Number of Children: 1  . Years of Education: N/A   Occupational History  . Disabled    Social History Main Topics  . Smoking status: Current Every Day Smoker -- 0.50 packs/day    Types: Cigarettes  . Smokeless tobacco: Never Used     Comment: Down to 1/2 a pack   . Alcohol Use: No  . Drug Use: No  . Sexual Activity: Not on file   Other Topics Concern  . Not on file   Social History Narrative   1 son, 59+ y/o   Daily caffeine use: 2 cups daily   Does not get regular exercise   Disability due to bipolar     Constitutional: Denies fever, malaise, fatigue, headache or abrupt weight changes.  Respiratory: Denies difficulty breathing, shortness of breath, cough or sputum production.   Cardiovascular: Denies chest pain, chest tightness, palpitations or swelling in the hands or feet.  Neurological: Denies dizziness, difficulty with memory, difficulty with speech or problems with balance and coordination.   No other specific complaints in a complete review of systems (except as listed in HPI above).  Objective:   Physical Exam   BP 108/72  Pulse 85  Temp(Src) 97.6  F (36.4 C) (Oral)  Wt 194 lb 4 oz (88.111 kg)  SpO2 94% Wt Readings from Last 3 Encounters:  04/12/13 194 lb 4 oz  (88.111 kg)  03/27/13 201 lb (91.173 kg)  02/06/13 201 lb (91.173 kg)    General: Appears her stated age, well developed, well nourished in NAD. Cardiovascular: Normal rate and rhythm. S1,S2 noted.  No murmur, rubs or gallops noted. No JVD or BLE edema. No carotid bruits noted. Pulmonary/Chest: Normal effort and positive vesicular breath sounds. No respiratory distress. No wheezes, rales or ronchi noted.    BMET    Component Value Date/Time   NA 139 08/27/2012 0823   K 4.1 08/27/2012 0823   CL 103 08/27/2012 0823   CO2 28 08/27/2012 0823   GLUCOSE 141* 08/27/2012 0823   BUN 13 08/27/2012 0823   CREATININE 0.9 08/27/2012 0823   CALCIUM 9.1 08/27/2012 0823   GFRNONAA 89* 05/09/2011 1158   GFRAA >90 05/09/2011 1158    Lipid Panel     Component Value Date/Time   CHOL 227* 08/27/2012 0823   TRIG 163.0* 08/27/2012 0823   HDL 87.90 08/27/2012 0823   CHOLHDL 3 08/27/2012 0823   VLDL 32.6 08/27/2012 0823    CBC    Component Value Date/Time   WBC 7.6 04/02/2012 1657   RBC 3.97 04/02/2012 1657   HGB 12.0 04/02/2012 1657   HCT 36.4 04/02/2012 1657   PLT 181.0 04/02/2012 1657   MCV 91.8 04/02/2012 1657   MCH 29.9 05/09/2011 1158   MCHC 32.9 04/02/2012 1657   RDW 14.9* 04/02/2012 1657   LYMPHSABS 1.5 04/02/2012 1657   MONOABS 0.6 04/02/2012 1657   EOSABS 0.1 04/02/2012 1657   BASOSABS 0.0 04/02/2012 1657    Hgb A1C Lab Results  Component Value Date   HGBA1C 6.3 08/27/2012        Assessment & Plan:   COPD exacerbation/Aspiration pneumonia, resolved:  Continue your inhalers as prescribed Its good that you cut back on your smoking, but it is important for you to quit Smoking cessation counseling approx 5 minutes Hospital notes reviewed time to review approx 10 minutes  Left lung nodule:  Continue to follow up with pulmonology Stop smoking  Hyperglycemia:  Likely related to steroid use Will recheck A1C today  RTC as needed or needed if symptoms return or worsen

## 2013-04-12 NOTE — Telephone Encounter (Signed)
Pt request lab results from 04/12/13; pt notified as instructed from result note and pt voiced understanding.

## 2013-04-17 ENCOUNTER — Ambulatory Visit: Payer: Medicare Other | Admitting: Gastroenterology

## 2013-04-17 ENCOUNTER — Other Ambulatory Visit: Payer: Self-pay | Admitting: Family Medicine

## 2013-04-17 NOTE — Telephone Encounter (Signed)
Last office visit 04/12/2013 with Webb Silversmith.  Ok to refill?

## 2013-04-18 NOTE — Telephone Encounter (Signed)
Diazepam and clonazepam called to [facility]

## 2013-04-18 NOTE — Telephone Encounter (Signed)
Ok to phone in the ones that need to be phoned in, I have sent over the rest

## 2013-04-25 ENCOUNTER — Encounter: Payer: Self-pay | Admitting: *Deleted

## 2013-04-25 ENCOUNTER — Other Ambulatory Visit: Payer: Self-pay | Admitting: *Deleted

## 2013-04-25 MED ORDER — TIOTROPIUM BROMIDE MONOHYDRATE 18 MCG IN CAPS: 18.0000 ug | ORAL_CAPSULE | Freq: Every day | RESPIRATORY_TRACT | Status: DC

## 2013-04-25 MED ORDER — TOLTERODINE TARTRATE ER 2 MG PO CP24: ORAL_CAPSULE | ORAL | Status: DC

## 2013-04-25 MED ORDER — METFORMIN HCL ER 500 MG PO TB24: ORAL_TABLET | ORAL | Status: DC

## 2013-04-25 MED ORDER — ROPINIROLE HCL 3 MG PO TABS: ORAL_TABLET | ORAL | Status: DC

## 2013-04-25 NOTE — Telephone Encounter (Signed)
Received fax from pharmacy requesting 90 day rx instead of 30 day.

## 2013-04-25 NOTE — Telephone Encounter (Signed)
Encounter opened in error

## 2013-05-15 ENCOUNTER — Other Ambulatory Visit: Payer: Self-pay | Admitting: Family Medicine

## 2013-05-20 ENCOUNTER — Ambulatory Visit (INDEPENDENT_AMBULATORY_CARE_PROVIDER_SITE_OTHER): Payer: Medicare Other | Admitting: Family Medicine

## 2013-05-20 ENCOUNTER — Encounter: Payer: Self-pay | Admitting: Family Medicine

## 2013-05-20 VITALS — BP 108/68 | HR 84 | Temp 98.0°F | Wt 201.0 lb

## 2013-05-20 DIAGNOSIS — M79609 Pain in unspecified limb: Secondary | ICD-10-CM

## 2013-05-20 DIAGNOSIS — E2839 Other primary ovarian failure: Secondary | ICD-10-CM

## 2013-05-20 DIAGNOSIS — M79671 Pain in right foot: Secondary | ICD-10-CM

## 2013-05-20 NOTE — Patient Instructions (Signed)
I think this may be irritation due to salicylic acid. Use only on calluses, but on top of foot use a barrier cream until it heals and stops peeling (like desitin). This should improve the pain. If not better with this, let us know for xray of foot. Pass by Marion's office to schedule bone density scan.

## 2013-05-20 NOTE — Progress Notes (Signed)
Pre-visit discussion using our clinic review tool. No additional management support is needed unless otherwise documented below in the visit note.  

## 2013-05-20 NOTE — Assessment & Plan Note (Signed)
I think this pain is likely due to over use of salicylic acid on healthy skin of dorsal right foot. Discussed treatment as per pt instructions. If no better, consider xray to eval for stress fracture (although less likely today). Good pulses, feet warm bilaterally

## 2013-05-20 NOTE — Progress Notes (Signed)
Subjective:    Patient ID: Stephanie [name], female    DOB: [birth year], 62 y.o.   MRN: [id number]  HPI CC: R foot pain   Pleasant 62 yo pt of Dr. Hulen Shouts with history of RLS, anxiety, liver cirrhosis, HTN, borderline diabetes, COPD presents today with 3 wk h/o R foot pain.  To point of trouble with ambulation 2/2 pain - notes worse with walking >10 min.  Notes some swelling as well.  Points to dorsal midfoot and sole.  She does hve some peeling on top of foot - attributes to salicylic acid use.  Describes burning stinging pain.  No calf pain or knee or ankle pain.   Denies inciting trauma or injury or falls. H/o R foot pain in the past, thought attributable to salicylic acid use - which she continues to use.  Asks about dexa scan - postmenopausal since 62 yo - states had complete hysterectomy done for endometriosis.  Has been on hormone patches ever since (Alora).  Has used prednisone for COPD frequently in the past.  Past Medical History  Diagnosis Date  . Adenomatous colon polyp   . Insomnia   . RLS (restless legs syndrome)   . Colon polyp   . Incontinence   . Hepatitis B, chronic   . H/O: rheumatic fever   . Hyperlipidemia   . Unspecified disorders of nervous system   . Bipolar affective disorder     h/o  . H/O: CVA (cardiovascular accident)     TIA  . Fatty liver 04/09/08    found in abd CT  . Shortness of breath   . Obstructive sleep apnea     not using CPAP, last sleep study 2.5 years ago, per pt doctor is aware  . Blood transfusion 2000  . GERD (gastroesophageal reflux disease)   . Arthritis   . Aortic dissection     Type 1  . COPD (chronic obstructive pulmonary disease)   . Anxiety   . Depression   . Heart murmur     as a child  . Stroke     TIA- 2002  . Ulcer     Past Surgical History  Procedure Laterality Date  . Back surgery      x 45  . Cholecystectomy    . Rotator cuff repair      left  . Abdominal hysterectomy      total  . Lumbar fusion  10/09    . Hemorroidectomy      and colon polyp removed  . Abdominal exploration surgery    . Posterior cervical fusion/foraminotomy    . Eye surgery      bilat  . Lumbar wound debridement  05/09/2011    Procedure: LUMBAR WOUND DEBRIDEMENT;  Surgeon: Dahlia Bailiff;  Location: [facility];  Service: Orthopedics;  Laterality: N/A;  IRRIGATION AND DEBRIDEMENT SPINAL WOUND  . Axillary artery cannulation via 8-mm hemashield graft, median sternotomy, extracorporeal circulation with deep hypothermic circulatory arrest, repair of  aortic dessection  01/23/2009    Dr Arlyce Dice  . Cataract      both eyes  . Colonoscopy    . Liver biopsy  04/10/2012    Procedure: LIVER BIOPSY;  Surgeon: Inda Castle, MD;  Location: [facility];  Service: Endoscopy;  Laterality: N/A;  ultrasound to mark order for abd limited/liver to be marked to be sent by [email or web address]    Review of Systems Per HPI    Objective:   Physical Exam  Nursing note and  vitals reviewed. Constitutional: She appears well-developed and well-nourished. No distress.  Musculoskeletal: She exhibits no edema.  No calf pain or ankle pain, full ROM of ankles. Tender to palpation diffusely dorsal right foot and right sole. 2+ DP bilaterally, sensation intact. Feet warm bilaterally. Peeling of skin noted throughout dorsal R foot. Calluses on R sole evident       Assessment & Plan:  I do think it beneficial to order dexa scan today to evaluate for osteoporosis.  Will order today - and have her f/u with PCP.

## 2013-05-22 ENCOUNTER — Ambulatory Visit: Payer: Self-pay | Admitting: Family Medicine

## 2013-05-22 DIAGNOSIS — E349 Endocrine disorder, unspecified: Secondary | ICD-10-CM | POA: Diagnosis not present

## 2013-05-22 DIAGNOSIS — M899 Disorder of bone, unspecified: Secondary | ICD-10-CM | POA: Diagnosis not present

## 2013-05-22 DIAGNOSIS — F312 Bipolar disorder, current episode manic severe with psychotic features: Secondary | ICD-10-CM | POA: Diagnosis not present

## 2013-05-23 ENCOUNTER — Telehealth: Payer: Self-pay | Admitting: Pulmonary Disease

## 2013-05-23 ENCOUNTER — Other Ambulatory Visit: Payer: Self-pay | Admitting: Internal Medicine

## 2013-05-23 ENCOUNTER — Encounter: Payer: Self-pay | Admitting: Family Medicine

## 2013-05-23 NOTE — Telephone Encounter (Signed)
lmtcb x1 

## 2013-05-23 NOTE — Telephone Encounter (Signed)
PT was last se en on 01/25/2010.  Pt set up an appt w/ [facility] on 07/10/13 to re-establish as a pt.  Stephanie [name]

## 2013-06-06 ENCOUNTER — Other Ambulatory Visit: Payer: Self-pay | Admitting: Internal Medicine

## 2013-06-07 ENCOUNTER — Other Ambulatory Visit: Payer: Self-pay | Admitting: Family Medicine

## 2013-06-07 ENCOUNTER — Other Ambulatory Visit: Payer: Self-pay | Admitting: *Deleted

## 2013-06-07 DIAGNOSIS — R911 Solitary pulmonary nodule: Secondary | ICD-10-CM

## 2013-06-07 NOTE — Telephone Encounter (Signed)
Please send to [facility], Dr. Deborra Medina pt

## 2013-06-07 NOTE — Telephone Encounter (Signed)
Spoke to pt and informed her that Rx is available for pickup at the front desk; informed her that a gov't issued photo id is required for pickup

## 2013-06-07 NOTE — Telephone Encounter (Signed)
Last filled 04/02/13 with 1 refill, Last OV was with Dr Danise Mina 05/20/13--please advise

## 2013-06-12 DIAGNOSIS — F312 Bipolar disorder, current episode manic severe with psychotic features: Secondary | ICD-10-CM | POA: Diagnosis not present

## 2013-06-14 ENCOUNTER — Other Ambulatory Visit: Payer: Self-pay | Admitting: Family Medicine

## 2013-06-18 ENCOUNTER — Other Ambulatory Visit: Payer: Medicare Other

## 2013-06-18 ENCOUNTER — Ambulatory Visit: Payer: Medicare Other | Admitting: Thoracic Surgery (Cardiothoracic Vascular Surgery)

## 2013-06-20 ENCOUNTER — Telehealth: Payer: Self-pay | Admitting: *Deleted

## 2013-06-20 NOTE — Telephone Encounter (Signed)
Received prior auth request for Lidocaine. Auth paperwork obtained and placed in your inbox.

## 2013-06-20 NOTE — Telephone Encounter (Signed)
Signed and in my box.

## 2013-06-24 ENCOUNTER — Other Ambulatory Visit: Payer: Self-pay | Admitting: Family Medicine

## 2013-06-24 ENCOUNTER — Other Ambulatory Visit: Payer: Self-pay | Admitting: Internal Medicine

## 2013-06-25 ENCOUNTER — Other Ambulatory Visit: Payer: Self-pay | Admitting: Family Medicine

## 2013-06-25 DIAGNOSIS — F312 Bipolar disorder, current episode manic severe with psychotic features: Secondary | ICD-10-CM | POA: Diagnosis not present

## 2013-06-25 NOTE — Telephone Encounter (Signed)
Pt requesting medication refill. Last ov 12/2012 with no future appts scheduled. pls advise

## 2013-06-25 NOTE — Telephone Encounter (Signed)
Pt requesting medication refill. Last ov 04/2013 with no future appts sched. pls advise

## 2013-06-25 NOTE — Telephone Encounter (Signed)
Last filled 05/23/13--#30--please advise

## 2013-06-25 NOTE — Telephone Encounter (Signed)
Lm on pts vm informing her Rx has been faxed to requested pharmacy.

## 2013-06-25 NOTE — Telephone Encounter (Signed)
Medication phoned to pharmacy.  

## 2013-06-30 ENCOUNTER — Other Ambulatory Visit: Payer: Self-pay | Admitting: Family Medicine

## 2013-07-01 NOTE — Telephone Encounter (Signed)
Pt requesting medication refill. Med on Hx list only. Last ov 12/2012 with no future appts scheduled. pls advise

## 2013-07-02 ENCOUNTER — Encounter: Payer: Self-pay | Admitting: Thoracic Surgery (Cardiothoracic Vascular Surgery)

## 2013-07-02 ENCOUNTER — Ambulatory Visit
Admission: RE | Admit: 2013-07-02 | Discharge: 2013-07-02 | Disposition: A | Discharge status: Home / Self Care | Payer: Medicare Other | Source: Ambulatory Visit | Attending: Thoracic Surgery (Cardiothoracic Vascular Surgery) | Admitting: Thoracic Surgery (Cardiothoracic Vascular Surgery)

## 2013-07-02 ENCOUNTER — Ambulatory Visit (INDEPENDENT_AMBULATORY_CARE_PROVIDER_SITE_OTHER): Payer: Medicare Other | Admitting: Thoracic Surgery (Cardiothoracic Vascular Surgery)

## 2013-07-02 ENCOUNTER — Other Ambulatory Visit: Payer: Self-pay | Admitting: Family Medicine

## 2013-07-02 ENCOUNTER — Other Ambulatory Visit: Payer: Self-pay | Admitting: *Deleted

## 2013-07-02 VITALS — BP 137/82 | HR 85 | Resp 20 | Ht 63.0 in | Wt 203.0 lb

## 2013-07-02 DIAGNOSIS — R220 Localized swelling, mass and lump, head: Principal | ICD-10-CM

## 2013-07-02 DIAGNOSIS — R911 Solitary pulmonary nodule: Secondary | ICD-10-CM | POA: Diagnosis not present

## 2013-07-02 DIAGNOSIS — J3489 Other specified disorders of nose and nasal sinuses: Secondary | ICD-10-CM

## 2013-07-02 NOTE — Progress Notes (Signed)
HPI:  Stephanie [name] returns for a scheduled one year followup visit. She is a 63 year old woman with multiple medical problems including cirrhosis, ascites, COPD, sleep apnea, and bipolar disorder among others.   She was found in October of 2011 to have a small right apical lung nodule. This was followed by Dr. Marlyn Corporal who has subsequently retired. He recommended one final scan to be done last in January. On that scan there was a slight increase in the size of the groundglass opacity in the right apex to 8 mm. She now returns for her one-year followup.  She says she has not been doing well. She has gained 15 pounds since Christmas. She complains of shortness of breath, lack of energy and difficulty sleeping. She also feels like she's been very anxious. She did quit smoking back in November. She still has some cravings, but has remained abstinent. She has had a cough and also complains of some hoarseness.  Past Medical History  Diagnosis Date  . Adenomatous colon polyp   . Insomnia   . RLS (restless legs syndrome)   . Colon polyp   . Incontinence   . Hepatitis B, chronic   . H/O: rheumatic fever   . Hyperlipidemia   . Unspecified disorders of nervous system   . Bipolar affective disorder     h/o  . H/O: CVA (cardiovascular accident)     TIA  . Fatty liver 04/09/08    found in abd CT  . Shortness of breath   . Obstructive sleep apnea     not using CPAP, last sleep study 2.5 years ago, per pt doctor is aware  . Blood transfusion 2000  . GERD (gastroesophageal reflux disease)   . Arthritis   . Aortic dissection     Type 1  . COPD (chronic obstructive pulmonary disease)   . Anxiety   . Depression   . Heart murmur     as a child  . Stroke     TIA- 2002  . Ulcer         Current Outpatient Prescriptions  Medication Sig Dispense Refill  . ABILIFY 30 MG tablet TAKE 1 TABLET BY MOUTH EVERY MORNING  30 tablet  0  . albuterol (PROAIR HFA) 108 (90 BASE) MCG/ACT inhaler INHALE  2 PUFFS INTO THE LUNGS EVERY 6 (SIX) HOURS AS NEEDED FOR WHEEZING.  8.5 each  3  . clonazePAM (KLONOPIN) 0.5 MG tablet TAKE 1 TABLET BY MOUTH TWICE A DAY AS NEEDED FOR ANXIETY  60 tablet  0  . [facility]S STOOL SOFTENER 100 MG capsule TAKE 1 CAPSULE BY MOUTH 2 TIMES DAILY AS NEEDED FOR CONSTIPATION  60 capsule  3  . diazepam (VALIUM) 5 MG tablet TAKE 1 TABLET BY MOUH TWICE A DAY AS NEEDED FOR MUSCLE SPASMS  60 tablet  0  . estradiol (ALORA) 0.1 MG/24HR Place 1 patch (0.1 mg total) onto the skin 2 (two) times a week.  8 patch  12  . fluocinonide-emollient (LIDEX-E) 0.05 % cream Apply topically 2 (two) times daily.  30 g  0  . lamoTRIgine (LAMICTAL) 100 MG tablet Take 3 tablets by mouth once daily      . LIDODERM 5 % PLACE 1 PATCH ONTO THE SKIN DAILY. REMOVE & DISCARD PATCH WITHIN 12 HOURS OR AS DIRECTED BY MD  30 patch  0  . metFORMIN (GLUCOPHAGE-XR) 500 MG 24 hr tablet TAKE 1 TABLET (500 MG TOTAL) BY MOUTH DAILY WITH BREAKFAST.  90 tablet  1  .  mirtazapine (REMERON) 15 MG tablet TAKE 1 TABLET BY MOUTH AT BEDTIME  34 tablet  2  . nystatin (MYCOSTATIN/NYSTOP) 100000 UNIT/GM POWD APPLY TOPICALLY 2 TIMES DAILY AS DIRECTED  30 g  0  . omeprazole (PRILOSEC) 40 MG capsule TAKE 1 CAPSULE BY MOUTH DAILY.  90 capsule  1  . promethazine (PHENERGAN) 25 MG tablet TAKE 1/2 TO 1 TABLET BY MOUTH EVERY 8 HOURS AS NEEDED FOR NAUSEA  30 tablet  0  . QUEtiapine (SEROQUEL) 50 MG tablet TAKE 3 TABLETS BY MOUTH AT BEDTIME  90 tablet  2  . rOPINIRole (REQUIP) 3 MG tablet TAKE 1 TABLET BY MOUTH AT BEDTIME  90 tablet  0  . salicylic acid 6 % gel APPLY TO AFFECTED AREA EVERY DAY *USE AT NIGHT AND RINSE OFF IN THE MORNING*  40 g  0  . tiotropium (SPIRIVA HANDIHALER) 18 MCG inhalation capsule Place 1 capsule (18 mcg total) into inhaler and inhale daily.  90 capsule  1  . tolterodine (DETROL LA) 2 MG 24 hr capsule TAKE ONE CAPSULE BY MOUTH DAILY  90 capsule  1  . traMADol (ULTRAM) 50 MG tablet TAKE 2 TABLETS BY MOUTH EVERY MORNING AND  TAKE 2 TABLETS EVERY EVENING AS NEEDED FOR PAIN  120 tablet  1  . urea (CARMOL) 40 % CREA Apply nightly to feet      . vitamin D, CHOLECALCIFEROL, 400 UNITS tablet Take two by mouth daily  6 tablet  6  . zolpidem (AMBIEN) 5 MG tablet TAKE 1/2 TO 1 TABLET BY MOUTH AT BEDTIME  30 tablet  2   No current facility-administered medications for this visit.   Review of systems See history of present illness  Physical Exam BP 137/82  Pulse 85  Resp 20  Ht 5' 3"  (1.6 m)  Wt 203 lb (92.08 kg)  BMI 35.97 kg/m2  SpO2 13% Obese 63 year old female in no acute distress Neurologic alert and oriented x3, flat affect, no focal deficits No palpable cervical or supraclavicular adenopathy Lungs clear with equal breath sounds bilaterally Abdomen obese   Diagnostic Tests: CT chest 07/02/2013 IMPRESSION:  1. Ground-glass opacity right lung apex again measuring 8 mm,  unchanged from 2014, and minimally enlarged when compared to 2013  and 2012. Continued follow-up recommended. Consider CT thorax in 1  year.  2. Stable noncalcified 3 mm left lower lobe pulmonary nodule. This  finding has been stable for over 2 years and can be considered to be  benign.  3. Nonspecific mucosal asymmetry in the region of the left piriform  sinus. This is nonspecific, but mucosal lesion not excluded.  Consider targeted examination or imaging with contrast-enhanced CT  of the neck.  These results will be called to the ordering clinician or  representative by the Radiologist Assistant, and communication  documented in the PACS Dashboard.  Electronically Signed  By: Skipper Cliche M.D.  On: 07/02/2013 11:03  Impression: 63 year old woman with an 8 mm groundglass opacity in the right upper lobe. This is unchanged over the past year after possibly showing some growth in the previous year. We will plan to scan this again in one year as recommended by radiology.  More concerning immediately is the mucosal asymmetry in the  left perform sinus. This was picked up by the radiologist on the chest CT. I am going to order the contrast enhanced CT of the neck as recommended. She also needs to see a otorhinolaryngologist. She says she is seen one in the past  but can't remember who it was. Dr. Deborra Medina may have that in her records.  Plan: CT of neck and ENT consult  I will plan to see her back in one year with a CT of the chest to followup the right apical groundglass opacity.

## 2013-07-02 NOTE — Telephone Encounter (Signed)
Pt called to check on status of prior auth for lidoderm patches.

## 2013-07-03 ENCOUNTER — Other Ambulatory Visit: Payer: Self-pay | Admitting: *Deleted

## 2013-07-03 NOTE — Telephone Encounter (Signed)
Pt called checking the status of her PA, I advise pt (per Aniceto Boss) that the PA has been sent to insurance but we are waiting for a response. Pt advise me that her insurance will cover lidocaine patch 5%, I advise pt that's the medication we are doing the PA on, but pt thinks we were doing a PA on another medication. Aniceto Boss advise me that she will call the pt directly once we get an answer from her insurance

## 2013-07-04 ENCOUNTER — Telehealth: Payer: Self-pay | Admitting: *Deleted

## 2013-07-04 NOTE — Telephone Encounter (Signed)
Incoming fax received requesting pts most recent labs. Per Dr Deborra Medina, 03/2013 and 04/2013 labs faxed to [phone] to Dr Rolena Infante (sp?)

## 2013-07-05 ENCOUNTER — Ambulatory Visit: Payer: Self-pay | Admitting: Thoracic Surgery (Cardiothoracic Vascular Surgery)

## 2013-07-05 DIAGNOSIS — R220 Localized swelling, mass and lump, head: Secondary | ICD-10-CM | POA: Diagnosis not present

## 2013-07-05 DIAGNOSIS — R490 Dysphonia: Secondary | ICD-10-CM | POA: Diagnosis not present

## 2013-07-05 DIAGNOSIS — Z981 Arthrodesis status: Secondary | ICD-10-CM | POA: Diagnosis not present

## 2013-07-05 DIAGNOSIS — R221 Localized swelling, mass and lump, neck: Secondary | ICD-10-CM | POA: Diagnosis not present

## 2013-07-05 DIAGNOSIS — R911 Solitary pulmonary nodule: Secondary | ICD-10-CM | POA: Diagnosis not present

## 2013-07-05 NOTE — Telephone Encounter (Signed)
Pt called again to check on status of PA.  I told her that Lidoderm patches and Lidocaine patches are the same thing and that's what we're waiting for approval on.  Aniceto Boss has been out for 2 days so I'm not sure if they have actually responded and we just haven't seen it since [place] handles that.

## 2013-07-08 DIAGNOSIS — K219 Gastro-esophageal reflux disease without esophagitis: Secondary | ICD-10-CM | POA: Diagnosis not present

## 2013-07-08 NOTE — Telephone Encounter (Signed)
Prior auth denied by [employer], denial placed in inbox to be signed then sent to scan.

## 2013-07-08 NOTE — Telephone Encounter (Signed)
Spoke with patient and she wanted to know if there's anything else she can take to replace the patches?

## 2013-07-09 ENCOUNTER — Other Ambulatory Visit: Payer: Self-pay | Admitting: Family Medicine

## 2013-07-09 NOTE — Telephone Encounter (Signed)
Pt called back requesting refill or patch but then told me it needs a PA, call sent to [facility] to see if she has started a PA on Rx and what is that status

## 2013-07-09 NOTE — Telephone Encounter (Signed)
Pt left voicemail with Triage requesting refill of Rxs but didn't leave names of medications she needed refilled.  Called pt back and no answer so left voicemail asking pt what medications she needs refilled and to call our office back

## 2013-07-09 NOTE — Telephone Encounter (Signed)
Lm on pts vm requesting a call back

## 2013-07-09 NOTE — Telephone Encounter (Signed)
Unfortunately not given her other medical conditions.  I would advise that she speak wit her pain management doctor.

## 2013-07-10 ENCOUNTER — Ambulatory Visit (INDEPENDENT_AMBULATORY_CARE_PROVIDER_SITE_OTHER): Payer: Medicare Other | Admitting: Pulmonary Disease

## 2013-07-10 ENCOUNTER — Encounter: Payer: Self-pay | Admitting: Pulmonary Disease

## 2013-07-10 VITALS — BP 120/82 | HR 90 | Ht 63.0 in | Wt 206.0 lb

## 2013-07-10 DIAGNOSIS — G4733 Obstructive sleep apnea (adult) (pediatric): Secondary | ICD-10-CM | POA: Diagnosis not present

## 2013-07-10 DIAGNOSIS — R0600 Dyspnea, unspecified: Secondary | ICD-10-CM

## 2013-07-10 DIAGNOSIS — R911 Solitary pulmonary nodule: Secondary | ICD-10-CM | POA: Diagnosis not present

## 2013-07-10 DIAGNOSIS — R0989 Other specified symptoms and signs involving the circulatory and respiratory systems: Secondary | ICD-10-CM

## 2013-07-10 DIAGNOSIS — R0609 Other forms of dyspnea: Secondary | ICD-10-CM

## 2013-07-10 NOTE — Assessment & Plan Note (Signed)
She is followed by Dr. Roxan Hockey with [facility] for this.

## 2013-07-10 NOTE — Telephone Encounter (Signed)
Lm on pts home vm requesting a call back

## 2013-07-10 NOTE — Progress Notes (Signed)
Chief Complaint  Patient presents with  . Sleep Consult    Re-establish care, last seen 2008.    History of Present Illness: Stephanie [name] is a 63 y.o. female former smoker evaluation of sleep problems.  I last saw her in 2008.  She was found to have severe sleep apnea, and was started on CPAP.  She has the same CPAP since 2008.  Her machine is no longer working.    She has trouble falling asleep and staying asleep.  She takes [place] and lorazepam to help sleep >> she takes these at 9 pm.  She snores, and wakes up feeling like she can't breath.  She has trouble falling back to sleep when this happens.  She goes to sleep at 9 pm.  She falls asleep after 30 minutes.  She wakes up 3 to 4 times to use the bathroom.  She gets out of bed at 8 am.  She feels tired in the morning.  She denies morning headache.  She does not use anything to stay awake.  She denies sleep walking, sleep talking, bruxism, or nightmares.  There is no history of restless legs.  She denies sleep hallucinations, sleep paralysis, or cataplexy.  The Epworth score is 12 out of 24.  Tests: PSG 11/30/06 >> AHI 33.3, SpO2 low 85% CT chest 07/02/13 >> 8 mm nodule Rt apex, 3 mm nodule LLL  Stephanie [name]  has a past medical history of Adenomatous colon polyp; Insomnia; RLS (restless legs syndrome); Colon polyp; Incontinence; Hepatitis B, chronic; H/O: rheumatic fever; Hyperlipidemia; Unspecified disorders of nervous system; Bipolar affective disorder; H/O: CVA (cardiovascular accident); Fatty liver (04/09/08); Shortness of breath; Obstructive sleep apnea; Blood transfusion (2000); GERD (gastroesophageal reflux disease); Arthritis; Aortic dissection; COPD (chronic obstructive pulmonary disease); Anxiety; Depression; Heart murmur; Stroke; and Ulcer.  Stephanie [name]  has past surgical history that includes Back surgery; Cholecystectomy; Rotator cuff repair; Abdominal hysterectomy; Lumbar fusion (10/09); Hemorroidectomy;  Abdominal exploration surgery; Posterior cervical fusion/foraminotomy; Eye surgery; Lumbar wound debridement (05/09/2011); Axillary artery cannulation via 8-mm Hemashield graft, median sternotomy, extracorporeal circulation with deep hypothermic circulatory arrest, repair of  aortic dessection (01/23/2009); cataract; Colonoscopy; and Liver biopsy (04/10/2012).  Prior to Admission medications   Medication Sig Start Date End Date Taking? Authorizing Provider  ABILIFY 30 MG tablet TAKE 1 TABLET BY MOUTH EVERY MORNING 03/30/11  Yes Lucille Passy, MD  albuterol (PROAIR HFA) 108 (90 BASE) MCG/ACT inhaler INHALE 2 PUFFS INTO THE LUNGS EVERY 6 (SIX) HOURS AS NEEDED FOR WHEEZING. 11/12/12  Yes Lucille Passy, MD  clonazePAM (KLONOPIN) 0.5 MG tablet TAKE 1 TABLET BY MOUTH TWICE A DAY AS NEEDED FOR ANXIETY 06/24/13  Yes Lucille Passy, MD  [facility] STOOL SOFTENER 100 MG capsule TAKE 1 CAPSULE BY MOUTH 2 TIMES DAILY AS NEEDED FOR CONSTIPATION 05/15/13  Yes Lucille Passy, MD  diazepam (VALIUM) 5 MG tablet TAKE 1 TABLET BY MOUH TWICE A DAY AS NEEDED FOR MUSCLE SPASMS 06/24/13  Yes Lucille Passy, MD  estradiol (ALORA) 0.1 MG/24HR Place 1 patch (0.1 mg total) onto the skin 2 (two) times a week. 08/06/12  Yes Lucille Passy, MD  fluocinonide-emollient (LIDEX-E) 0.05 % cream Apply topically 2 (two) times daily. 10/30/12  Yes Lucille Passy, MD  lamoTRIgine (LAMICTAL) 100 MG tablet Take 3 tablets by mouth once daily   Yes Historical Provider, MD  LIDODERM 5 % PLACE 1 PATCH ONTO THE SKIN DAILY. REMOVE & DISCARD PATCH WITHIN 12 HOURS  OR AS DIRECTED BY MD 06/14/13  Yes Lucille Passy, MD  metFORMIN (GLUCOPHAGE-XR) 500 MG 24 hr tablet TAKE 1 TABLET (500 MG TOTAL) BY MOUTH DAILY WITH BREAKFAST. 04/25/13  Yes Lucille Passy, MD  mirtazapine (REMERON) 15 MG tablet TAKE 1 TABLET BY MOUTH AT BEDTIME 04/17/13  Yes Webb Silversmith, NP  nystatin (MYCOSTATIN/NYSTOP) 100000 UNIT/GM POWD APPLY TOPICALLY 2 TIMES DAILY AS DIRECTED 01/10/13  Yes Ria Bush, MD   omeprazole (PRILOSEC) 40 MG capsule TAKE 1 CAPSULE BY MOUTH DAILY. 03/16/13  Yes Lucille Passy, MD  promethazine (PHENERGAN) 25 MG tablet TAKE 1/2 TO 1 TABLET BY MOUTH EVERY 8 HOURS AS NEEDED FOR NAUSEA 06/24/13  Yes Lucille Passy, MD  QUEtiapine (SEROQUEL) 50 MG tablet TAKE 3 TABLETS BY MOUTH AT BEDTIME 06/30/13  Yes Lucille Passy, MD  rOPINIRole (REQUIP) 3 MG tablet TAKE 1 TABLET BY MOUTH AT BEDTIME 04/25/13  Yes Webb Silversmith, NP  salicylic acid 6 % gel APPLY TO AFFECTED AREA EVERY DAY *USE AT NIGHT AND RINSE OFF IN THE MORNING* 03/19/13  Yes Lucille Passy, MD  tiotropium (SPIRIVA HANDIHALER) 18 MCG inhalation capsule Place 1 capsule (18 mcg total) into inhaler and inhale daily. 04/25/13  Yes Lucille Passy, MD  tolterodine (DETROL LA) 2 MG 24 hr capsule TAKE ONE CAPSULE BY MOUTH DAILY 04/25/13  Yes Lucille Passy, MD  traMADol (ULTRAM) 50 MG tablet TAKE 2 TABLETS BY MOUTH EVERY MORNING AND TAKE 2 TABLETS EVERY EVENING AS NEEDED FOR PAIN 06/07/13  Yes Lucille Passy, MD  urea (CARMOL) 40 % CREA Apply nightly to feet 03/14/12  Yes Historical Provider, MD  vitamin D, CHOLECALCIFEROL, 400 UNITS tablet Take two by mouth daily 01/30/12  Yes Lucille Passy, MD  zolpidem (AMBIEN) 5 MG tablet TAKE 1/2 TO 1 TABLET BY MOUTH AT BEDTIME 06/25/13  Yes Lucille Passy, MD    No Known Allergies  Her family history includes Breast cancer in her mother; Diabetes in an other family member; Lung cancer in her mother; Stomach cancer in her father. There is no history of Colon cancer, Esophageal cancer, or Rectal cancer.  She  reports that she quit smoking about 3 months ago. Her smoking use included Cigarettes. She has a 25 pack-year smoking history. She has never used smokeless tobacco. She reports that she does not drink alcohol or use illicit drugs.  Review of Systems  Constitutional: Positive for unexpected weight change. Negative for fever, chills, diaphoresis, activity change, appetite change and fatigue.  HENT: Positive for  sneezing. Negative for congestion, dental problem, ear discharge, ear pain, facial swelling, hearing loss, mouth sores, nosebleeds, postnasal drip, rhinorrhea, sinus pressure, sore throat, tinnitus, trouble swallowing and voice change.   Eyes: Negative for photophobia, discharge, itching and visual disturbance.  Respiratory: Positive for shortness of breath. Negative for apnea, cough, choking, chest tightness, wheezing and stridor.   Cardiovascular: Negative for chest pain, palpitations and leg swelling.  Gastrointestinal: Negative for nausea, vomiting, abdominal pain, constipation, blood in stool and abdominal distention.  Genitourinary: Negative for dysuria, urgency, frequency, hematuria, flank pain, decreased urine volume and difficulty urinating.  Musculoskeletal: Positive for arthralgias. Negative for back pain, gait problem, joint swelling, myalgias, neck pain and neck stiffness.  Skin: Negative for color change, pallor and rash.  Neurological: Negative for dizziness, tremors, seizures, syncope, speech difficulty, weakness, light-headedness, numbness and headaches.  Hematological: Negative for adenopathy. Does not bruise/bleed easily.  Psychiatric/Behavioral: Positive for dysphoric mood. Negative for confusion, sleep  disturbance and agitation. The patient is nervous/anxious.    Physical Exam:  General - No distress ENT - No sinus tenderness, no oral exudate, no LAN, no thyromegaly, TM clear, pupils equal/reactive, MP 3 Cardiac - s1s2 regular, no murmur, pulses symmetric Chest - No wheeze/rales/dullness, good air entry, normal respiratory excursion Back - No focal tenderness Abd - Soft, non-tender, no organomegaly, + bowel sounds Ext - No edema Neuro - Normal strength, cranial nerves intact Skin - No rashes Psych - Normal mood, and behavior  Assessment/plan:  Chesley Mires, MD [place] 07/10/2013, 2:46 PM Pager:  [phone] After 3pm call: [phone]

## 2013-07-10 NOTE — Assessment & Plan Note (Signed)
She reports dyspnea on exertion.  She has history of COPD.  She likely has component of deconditioning.  Will address this further after her sleep apnea is further stabilized.

## 2013-07-10 NOTE — Patient Instructions (Signed)
Will arrange for sleep study Will call to arrange for follow up after sleep study reviewed 

## 2013-07-10 NOTE — Assessment & Plan Note (Signed)
She has prior history of sleep apnea.  She continues to have snoring, sleep disruption, apnea, and daytime sleepiness.  She has history of COPD, stroke, and mood disorder.  I am concerned she still has sleep apnea.  We discussed how sleep apnea can affect various health problems including risks for hypertension, cardiovascular disease, and diabetes.  We also discussed how sleep disruption can increase risks for accident, such as while driving.  Weight loss as a means of improving sleep apnea was also reviewed.  Additional treatment options discussed were CPAP therapy, oral appliance, and surgical intervention.  To further assess will arrange for in lab sleep study.

## 2013-07-10 NOTE — Progress Notes (Deleted)
   Subjective:    Patient ID: Stephanie [name], female    DOB: [birth year], 63 y.o.   MRN: [id number]  HPI    Review of Systems  Constitutional: Positive for unexpected weight change. Negative for fever, chills, diaphoresis, activity change, appetite change and fatigue.  HENT: Positive for sneezing. Negative for congestion, dental problem, ear discharge, ear pain, facial swelling, hearing loss, mouth sores, nosebleeds, postnasal drip, rhinorrhea, sinus pressure, sore throat, tinnitus, trouble swallowing and voice change.   Eyes: Negative for photophobia, discharge, itching and visual disturbance.  Respiratory: Positive for shortness of breath. Negative for apnea, cough, choking, chest tightness, wheezing and stridor.   Cardiovascular: Negative for chest pain, palpitations and leg swelling.  Gastrointestinal: Negative for nausea, vomiting, abdominal pain, constipation, blood in stool and abdominal distention.  Genitourinary: Negative for dysuria, urgency, frequency, hematuria, flank pain, decreased urine volume and difficulty urinating.  Musculoskeletal: Positive for arthralgias. Negative for back pain, gait problem, joint swelling, myalgias, neck pain and neck stiffness.  Skin: Negative for color change, pallor and rash.  Neurological: Negative for dizziness, tremors, seizures, syncope, speech difficulty, weakness, light-headedness, numbness and headaches.  Hematological: Negative for adenopathy. Does not bruise/bleed easily.  Psychiatric/Behavioral: Positive for dysphoric mood. Negative for confusion, sleep disturbance and agitation. The patient is nervous/anxious.        Objective:   Physical Exam        Assessment & Plan:

## 2013-07-11 NOTE — Telephone Encounter (Signed)
Spoke to pt and advised per Dr Deborra Medina; pt voiced verbal understanding.

## 2013-07-14 ENCOUNTER — Other Ambulatory Visit: Payer: Self-pay | Admitting: Family Medicine

## 2013-07-15 ENCOUNTER — Telehealth: Payer: Self-pay

## 2013-07-15 NOTE — Telephone Encounter (Signed)
Pt left v/m wanting to know what to do about prior auth needed for Lidoderm patch. Spoke with pharmacist at [facility] and she will fax prior auth request to Dr Deborra Medina. Left v/m notifying pt about PA process.

## 2013-07-19 ENCOUNTER — Other Ambulatory Visit: Payer: Self-pay | Admitting: Family Medicine

## 2013-07-19 DIAGNOSIS — F312 Bipolar disorder, current episode manic severe with psychotic features: Secondary | ICD-10-CM | POA: Diagnosis not present

## 2013-07-19 NOTE — Telephone Encounter (Signed)
Spoke to pt and informed her per Dr Deborra Medina, she will need to contact prescribing provider

## 2013-07-19 NOTE — Telephone Encounter (Signed)
Pt left v/m returning call and request cb [phone] or [phone].

## 2013-07-19 NOTE — Telephone Encounter (Signed)
I have not been refilling this.  She needs to request this from physician who has been refilling this.

## 2013-07-19 NOTE — Telephone Encounter (Signed)
Lm on pts vm requesting a call back

## 2013-07-19 NOTE — Telephone Encounter (Signed)
Pt requesting medication refill. Last ov 12/2012. pls advise

## 2013-07-22 ENCOUNTER — Other Ambulatory Visit: Payer: Self-pay | Admitting: Family Medicine

## 2013-07-22 ENCOUNTER — Other Ambulatory Visit (INDEPENDENT_AMBULATORY_CARE_PROVIDER_SITE_OTHER): Payer: Medicare Other

## 2013-07-22 DIAGNOSIS — Z0000 Encounter for general adult medical examination without abnormal findings: Secondary | ICD-10-CM | POA: Diagnosis not present

## 2013-07-22 DIAGNOSIS — J449 Chronic obstructive pulmonary disease, unspecified: Secondary | ICD-10-CM

## 2013-07-22 DIAGNOSIS — I10 Essential (primary) hypertension: Secondary | ICD-10-CM

## 2013-07-22 DIAGNOSIS — E559 Vitamin D deficiency, unspecified: Secondary | ICD-10-CM | POA: Diagnosis not present

## 2013-07-22 DIAGNOSIS — E785 Hyperlipidemia, unspecified: Secondary | ICD-10-CM | POA: Diagnosis not present

## 2013-07-22 LAB — CBC WITH DIFFERENTIAL/PLATELET
BASOS PCT: 0.5 % (ref 0.0–3.0)
Basophils Absolute: 0 10*3/uL (ref 0.0–0.1)
Eosinophils Absolute: 0.1 10*3/uL (ref 0.0–0.7)
Eosinophils Relative: 1.3 % (ref 0.0–5.0)
HCT: 40.9 % (ref 36.0–46.0)
HEMOGLOBIN: 13.3 g/dL (ref 12.0–15.0)
Lymphocytes Relative: 16.8 % (ref 12.0–46.0)
Lymphs Abs: 1.1 10*3/uL (ref 0.7–4.0)
MCHC: 32.5 g/dL (ref 30.0–36.0)
MCV: 91.9 fl (ref 78.0–100.0)
MONO ABS: 0.4 10*3/uL (ref 0.1–1.0)
Monocytes Relative: 6.3 % (ref 3.0–12.0)
NEUTROS ABS: 5 10*3/uL (ref 1.4–7.7)
Neutrophils Relative %: 75.1 % (ref 43.0–77.0)
Platelets: 176 10*3/uL (ref 150.0–400.0)
RBC: 4.46 Mil/uL (ref 3.87–5.11)
RDW: 15 % — ABNORMAL HIGH (ref 11.5–14.6)
WBC: 6.6 10*3/uL (ref 4.5–10.5)

## 2013-07-22 LAB — COMPREHENSIVE METABOLIC PANEL
ALBUMIN: 3.7 g/dL (ref 3.5–5.2)
ALT: 26 U/L (ref 0–35)
AST: 36 U/L (ref 0–37)
Alkaline Phosphatase: 121 U/L — ABNORMAL HIGH (ref 39–117)
BUN: 16 mg/dL (ref 6–23)
CALCIUM: 9.1 mg/dL (ref 8.4–10.5)
CHLORIDE: 103 meq/L (ref 96–112)
CO2: 27 meq/L (ref 19–32)
Creatinine, Ser: 0.8 mg/dL (ref 0.4–1.2)
GFR: 73.82 mL/min (ref >60.00)
Glucose, Bld: 116 mg/dL — ABNORMAL HIGH (ref 70–99)
POTASSIUM: 4.2 meq/L (ref 3.5–5.1)
SODIUM: 139 meq/L (ref 135–145)
TOTAL PROTEIN: 7.3 g/dL (ref 6.0–8.3)
Total Bilirubin: 0.6 mg/dL (ref 0.3–1.2)

## 2013-07-22 LAB — LIPID PANEL
Cholesterol: 251 mg/dL — ABNORMAL HIGH (ref 0–200)
HDL: 96.5 mg/dL (ref >39.00)
Total CHOL/HDL Ratio: 3
Triglycerides: 135 mg/dL (ref 0.0–149.0)
VLDL: 27 mg/dL (ref 0.0–40.0)

## 2013-07-22 LAB — LDL CHOLESTEROL, DIRECT: LDL DIRECT: 143.4 mg/dL

## 2013-07-22 LAB — TSH: TSH: 3.56 u[IU]/mL (ref 0.35–5.50)

## 2013-07-23 ENCOUNTER — Other Ambulatory Visit: Payer: Self-pay | Admitting: Internal Medicine

## 2013-07-23 ENCOUNTER — Other Ambulatory Visit: Payer: Self-pay | Admitting: Family Medicine

## 2013-07-24 ENCOUNTER — Telehealth: Payer: Self-pay | Admitting: Family Medicine

## 2013-07-24 NOTE — Telephone Encounter (Signed)
Pt requesting medication refill. Pt has upcoming CPE 07/29/13. pls advise

## 2013-07-24 NOTE — Telephone Encounter (Signed)
Last fellied 04/17/13 with 2 refills--Next OV with you on 07/29/2013--please advise

## 2013-07-24 NOTE — Telephone Encounter (Signed)
Relevant patient education mailed to patient.  

## 2013-07-25 ENCOUNTER — Telehealth: Payer: Self-pay

## 2013-07-25 NOTE — Telephone Encounter (Signed)
Pt called to see if results available from recent labs; advised pt Dr Deborra Medina noted will discuss with pt at 07/29/13 appt. Pt voiced understanding.

## 2013-07-29 ENCOUNTER — Ambulatory Visit (INDEPENDENT_AMBULATORY_CARE_PROVIDER_SITE_OTHER): Payer: Medicare Other | Admitting: Family Medicine

## 2013-07-29 ENCOUNTER — Other Ambulatory Visit (HOSPITAL_COMMUNITY)
Admission: RE | Admit: 2013-07-29 | Discharge: 2013-07-29 | Disposition: A | Discharge status: Home / Self Care | Payer: Medicare Other | Source: Ambulatory Visit | Attending: Family Medicine | Admitting: Family Medicine

## 2013-07-29 ENCOUNTER — Encounter: Payer: Self-pay | Admitting: Family Medicine

## 2013-07-29 VITALS — BP 142/82 | HR 83 | Temp 97.5°F | Ht 62.25 in | Wt 204.8 lb

## 2013-07-29 DIAGNOSIS — Z0000 Encounter for general adult medical examination without abnormal findings: Secondary | ICD-10-CM

## 2013-07-29 DIAGNOSIS — R350 Frequency of micturition: Secondary | ICD-10-CM | POA: Diagnosis not present

## 2013-07-29 DIAGNOSIS — R82998 Other abnormal findings in urine: Secondary | ICD-10-CM | POA: Diagnosis not present

## 2013-07-29 DIAGNOSIS — Z1151 Encounter for screening for human papillomavirus (HPV): Secondary | ICD-10-CM | POA: Diagnosis not present

## 2013-07-29 DIAGNOSIS — Z01419 Encounter for gynecological examination (general) (routine) without abnormal findings: Secondary | ICD-10-CM

## 2013-07-29 DIAGNOSIS — Z124 Encounter for screening for malignant neoplasm of cervix: Secondary | ICD-10-CM | POA: Diagnosis not present

## 2013-07-29 DIAGNOSIS — Z66 Do not resuscitate: Secondary | ICD-10-CM

## 2013-07-29 DIAGNOSIS — B372 Candidiasis of skin and nail: Secondary | ICD-10-CM

## 2013-07-29 DIAGNOSIS — R8290 Unspecified abnormal findings in urine: Secondary | ICD-10-CM

## 2013-07-29 LAB — POCT URINALYSIS DIPSTICK
BILIRUBIN UA: NEGATIVE
GLUCOSE UA: NEGATIVE
Ketones, UA: 5
Nitrite, UA: NEGATIVE
RBC UA: NEGATIVE
Spec Grav, UA: >=1.03
Urobilinogen, UA: 0.2
pH, UA: 6

## 2013-07-29 MED ORDER — METFORMIN HCL ER 750 MG PO TB24: ORAL_TABLET | ORAL | Status: DC

## 2013-07-29 MED ORDER — NYSTATIN-TRIAMCINOLONE 100000-0.1 UNIT/GM-% EX OINT: 1.0000 "application " | TOPICAL_OINTMENT | Freq: Two times a day (BID) | CUTANEOUS | Status: DC

## 2013-07-29 NOTE — Addendum Note (Signed)
Addended by: Modena Nunnery on: 07/29/2013 02:25 PM   Modules accepted: Orders

## 2013-07-29 NOTE — Patient Instructions (Addendum)
Good to see you. We are increasing your Metformin to 750 mg daily.  Apply mycolog twice daily to area and you can put the powder on top of it. Ok to stop once the rash resolved. We will call you with your urine culture results.

## 2013-07-29 NOTE — Progress Notes (Signed)
Subjective:   Patient ID: Stephanie [name], female    DOB: 1950-10-18, 63 y.o.   MRN: [id number]  PATRICIAANN [name] is a pleasant 63 y.o. year old female who presents to clinic today with Annual Exam and Urinary Frequency  on 07/29/2013  HPI: I have personally reviewed the Medicare Annual Wellness questionnaire and have noted 1. The patient's medical and social history 2. Their use of alcohol, tobacco or illicit drugs 3. Their current medications and supplements 4. The patient's functional ability including ADL's, fall risks, home safety risks and hearing or visual             impairment. 5. Diet and physical activities 6. Evidence for depression or mood disorders  End of life wishes discussed and updated in Social History- DNR.  Given another yellow sheet today to have in her home.  Colonoscopy 05/22/2007. Zostavax 12/13/2010  Pneumovax UTD Tdap 12/16/2008 Mammogram 01/14/2013.  S/p hysterectomy but she would like a pap smear today.  Has had increased urinary frequency and fishy odor in her urine.  Chronic back pain.  No fevers.  Hyperglycemia- deteriorated.  On Metformin 500 mg XR daily. Lab Results  Component Value Date   HGBA1C 6.3 04/12/2013    Rash under breast and suprapubic- has been using nystatin powder but only helping a little.  Patient Active Problem List   Diagnosis Date Noted  . Medicare annual wellness visit, subsequent 07/29/2013  . DNR (do not resuscitate) 07/29/2013  . [place] intertrigo 07/29/2013  . Encounter for routine gynecological examination 07/29/2013  . Dyspnea 07/10/2013  . Lung nodule 07/10/2013  . Right foot pain 05/20/2013  . Varicose veins of lower extremities with other complications 62/95/2841  . GERD (gastroesophageal reflux disease) 10/09/2012  . Cirrhosis of liver 02/02/2012  . Anxiety 07/11/2011  . Aortic dissection   . BACK PAIN, LUMBAR, CHRONIC 11/19/2009  . UNS ADVRS EFF OTH RX MEDICINAL&BIOLOGICAL SBSTNC 12/16/2008  .  UNSPECIFIED VITAMIN D DEFICIENCY 09/26/2008  . TOBACCO ABUSE 06/19/2008  . RESTLESS LEGS SYNDROME 04/27/2007  . INSOMNIA 04/27/2007  . Benign Neoplasm of Colon 04/04/2007  . HYPERLIPIDEMIA 03/08/2007  . OBSTRUCTIVE SLEEP APNEA 03/08/2007  . HYPERTENSION 03/08/2007  . COPD 03/08/2007  . BIPOLAR AFFECTIVE DISORDER, HX OF 03/08/2007   Past Medical History  Diagnosis Date  . Adenomatous colon polyp   . Insomnia   . RLS (restless legs syndrome)   . Colon polyp   . Incontinence   . Hepatitis B, chronic   . H/O: rheumatic fever   . Hyperlipidemia   . Unspecified disorders of nervous system   . Bipolar affective disorder     h/o  . H/O: CVA (cardiovascular accident)     TIA  . Fatty liver 04/09/08    found in abd CT  . Shortness of breath   . Obstructive sleep apnea     not using CPAP, last sleep study 2.5 years ago, per pt doctor is aware  . Blood transfusion 2000  . GERD (gastroesophageal reflux disease)   . Arthritis   . Aortic dissection     Type 1  . COPD (chronic obstructive pulmonary disease)   . Anxiety   . Depression   . Heart murmur     as a child  . Stroke     TIA- 2002  . Ulcer    Past Surgical History  Procedure Laterality Date  . Back surgery      x 45  . Cholecystectomy    .  Rotator cuff repair      left  . Abdominal hysterectomy      total  . Lumbar fusion  10/09  . Hemorroidectomy      and colon polyp removed  . Abdominal exploration surgery    . Posterior cervical fusion/foraminotomy    . Eye surgery      bilat  . Lumbar wound debridement  05/09/2011    Procedure: LUMBAR WOUND DEBRIDEMENT;  Surgeon: Dahlia Bailiff;  Location: [facility];  Service: Orthopedics;  Laterality: N/A;  IRRIGATION AND DEBRIDEMENT SPINAL WOUND  . Axillary artery cannulation via 8-mm hemashield graft, median sternotomy, extracorporeal circulation with deep hypothermic circulatory arrest, repair of  aortic dessection  01/23/2009    Dr Arlyce Dice  . Cataract      both eyes  .  Colonoscopy    . Liver biopsy  04/10/2012    Procedure: LIVER BIOPSY;  Surgeon: Inda Castle, MD;  Location: [facility];  Service: Endoscopy;  Laterality: N/A;  ultrasound to mark order for abd limited/liver to be marked to be sent by [email or web address]    History  Substance Use Topics  . Smoking status: Former Smoker -- 0.50 packs/day for 50 years    Types: Cigarettes    Quit date: 04/06/2013  . Smokeless tobacco: Never Used  . Alcohol Use: No   Family History  Problem Relation Age of Onset  . Colon cancer Neg Hx   . Esophageal cancer Neg Hx   . Rectal cancer Neg Hx   . Breast cancer Mother   . Lung cancer Mother   . Stomach cancer Father   . Diabetes      4 aunts, and 1 uncle   No Known Allergies Current Outpatient Prescriptions on File Prior to Visit  Medication Sig Dispense Refill  . ABILIFY 30 MG tablet TAKE 1 TABLET BY MOUTH EVERY MORNING  30 tablet  0  . albuterol (PROAIR HFA) 108 (90 BASE) MCG/ACT inhaler INHALE 2 PUFFS INTO THE LUNGS EVERY 6 (SIX) HOURS AS NEEDED FOR WHEEZING.  8.5 each  3  . clonazePAM (KLONOPIN) 0.5 MG tablet TAKE 1 TABLET BY MOUTH TWICE A DAY AS NEEDED FOR ANXIETY  60 tablet  0  . [facility]S STOOL SOFTENER 100 MG capsule TAKE 1 CAPSULE BY MOUTH 2 TIMES DAILY AS NEEDED FOR CONSTIPATION  60 capsule  3  . diazepam (VALIUM) 5 MG tablet TAKE 1 TABLET BY MOUH TWICE A DAY AS NEEDED FOR MUSCLE SPASMS  60 tablet  0  . estradiol (ALORA) 0.1 MG/24HR Place 1 patch (0.1 mg total) onto the skin 2 (two) times a week.  8 patch  12  . lamoTRIgine (LAMICTAL) 100 MG tablet Take 3 tablets by mouth once daily      . LIDODERM 5 % PLACE 1 PATCH ONTO THE SKIN DAILY. REMOVE & DISCARD PATCH WITHIN 12 HOURS OR AS DIRECTED BY MD  30 patch  0  . mirtazapine (REMERON) 15 MG tablet TAKE 1 TABLET BY MOUTH AT BEDTIME  34 tablet  2  . nystatin (MYCOSTATIN/NYSTOP) 100000 UNIT/GM POWD APPLY TOPICALLY 2 TIMES DAILY AS DIRECTED  30 g  0  . omeprazole (PRILOSEC) 40 MG capsule TAKE 1 CAPSULE BY MOUTH  DAILY.  90 capsule  1  . promethazine (PHENERGAN) 25 MG tablet TAKE 1/2 TO 1 TABLET BY MOUTH EVERY 8 HOURS AS NEEDED FOR NAUSEA  30 tablet  0  . QUEtiapine (SEROQUEL) 50 MG tablet TAKE 3 TABLETS BY MOUTH AT BEDTIME  90 tablet  2  . rOPINIRole (REQUIP) 3 MG tablet TAKE 1 TABLET BY MOUTH AT BEDTIME  90 tablet  0  . salicylic acid 6 % gel APPLY TO AFFECTED AREA EVERY DAY *USE AT NIGHT AND RINSE OFF IN THE MORNING*  40 g  0  . tiotropium (SPIRIVA HANDIHALER) 18 MCG inhalation capsule Place 1 capsule (18 mcg total) into inhaler and inhale daily.  90 capsule  1  . tolterodine (DETROL LA) 2 MG 24 hr capsule TAKE ONE CAPSULE BY MOUTH DAILY  90 capsule  1  . traMADol (ULTRAM) 50 MG tablet TAKE 2 TABLETS BY MOUTH EVERY MORNING AND TAKE 2 TABLETS EVERY EVENING AS NEEDED FOR PAIN  120 tablet  1  . urea (CARMOL) 40 % CREA Apply nightly to feet      . vitamin D, CHOLECALCIFEROL, 400 UNITS tablet Take two by mouth daily  6 tablet  6  . zolpidem (AMBIEN) 5 MG tablet TAKE 1/2 TO 1 TABLET BY MOUTH AT BEDTIME  30 tablet  2   No current facility-administered medications on file prior to visit.   The PMH, PSH, Social History, Family History, Medications, and allergies have been reviewed in [facility], and have been updated if relevant.   Review of Systems  Constitutional: Positive for unexpected weight change.  HENT: Negative for hearing loss.   Eyes: Negative for visual disturbance.  Respiratory: Negative for shortness of breath.   Cardiovascular: Negative for chest pain.  Gastrointestinal: Negative for vomiting and blood in stool.  Endocrine: Negative for polydipsia, polyphagia and polyuria.  Genitourinary: Negative for vaginal pain.  Neurological: Negative for seizures, syncope, speech difficulty and numbness.  Psychiatric/Behavioral: Negative for behavioral problems.  All other systems reviewed and are negative.      See HPI  Objective:    BP 142/82  Pulse 83  Temp(Src) 97.5 F (36.4 C) (Oral)  Ht 5'  2.25" (1.581 m)  Wt 204 lb 12 oz (92.874 kg)  BMI 37.16 kg/m2  SpO2 96%   Physical Exam   General:  Well-developed,well-nourished,in no acute distress; alert,appropriate and cooperative throughout examination Head:  normocephalic and atraumatic.   Eyes:  vision grossly intact, pupils equal, pupils round, and pupils reactive to light.   Ears:  R ear normal and L ear normal.   Nose:  no external deformity.   Mouth:  good dentition.   Neck:  No deformities, masses, or tenderness noted. Breasts:  No mass, nodules, thickening, tenderness, bulging, retraction, inflamation, nipple discharge or skin changes noted.   Lungs:  Normal respiratory effort, chest expands symmetrically. Lungs are clear to auscultation, no crackles or wheezes. Heart:  Normal rate and regular rhythm. S1 and S2 normal without gallop, murmur, click, rub or other extra sounds. Abdomen:  Bowel sounds positive,abdomen soft and non-tender without masses, organomegaly or hernias noted. Rectal:  no external abnormalities.   Genitalia:  Pelvic Exam:        External: normal female genitalia without lesions or masses        Vagina: normal without lesions or masses        Cervix:absent        Adnexa: normal bimanual exam without masses or fullness        Uterus: absent        Pap smear: performed Msk:  No deformity or scoliosis noted of thoracic or lumbar spine.   Extremities:  No clubbing, cyanosis, edema, or deformity noted with normal full range of motion of all joints.   Neurologic:  alert & oriented X3 and gait normal.   Skin:  Red confluent rash under breast bilaterally and supra pubically under skin folds Cervical Nodes:  No lymphadenopathy noted Axillary Nodes:  No palpable lymphadenopathy Psych:  Cognition and judgment appear intact. Alert and cooperative with normal attention span and concentration. No apparent delusions, illusions, hallucinations       Assessment & Plan:   Urine frequency - Plan: Urinalysis  Dipstick  Medicare annual wellness visit, subsequent No Follow-up on file.

## 2013-07-29 NOTE — Assessment & Plan Note (Signed)
Deteriorated.  Add Mycolog twice daily until rash resolves. Call or return to clinic prn if these symptoms worsen or fail to improve as anticipated. The patient indicates understanding of these issues and agrees with the plan.

## 2013-07-29 NOTE — Assessment & Plan Note (Signed)
Yellow DNR form completed, signed and returned to pt.

## 2013-07-29 NOTE — Assessment & Plan Note (Signed)
Pap smear performed per pt request.

## 2013-07-29 NOTE — Assessment & Plan Note (Signed)
The patients weight, height, BMI and visual acuity have been recorded in the chart I have made referrals, counseling and provided education to the patient based review of the above and I have provided the pt with a written personalized care plan for preventive services.

## 2013-07-29 NOTE — Progress Notes (Signed)
Pre visit review using our clinic review tool, if applicable. No additional management support is needed unless otherwise documented below in the visit note. 

## 2013-07-29 NOTE — Assessment & Plan Note (Signed)
UA pos for LE- will send for cx before starting tx. The patient indicates understanding of these issues and agrees with the plan.

## 2013-07-31 ENCOUNTER — Encounter: Payer: Self-pay | Admitting: *Deleted

## 2013-07-31 ENCOUNTER — Telehealth: Payer: Self-pay

## 2013-07-31 LAB — URINE CULTURE
Colony Count: NO GROWTH
ORGANISM ID, BACTERIA: NO GROWTH

## 2013-07-31 NOTE — Telephone Encounter (Signed)
Spoke to pt and informed her results are not back as of yet; informed her we will contact her upon their arrival and resulting.

## 2013-07-31 NOTE — Telephone Encounter (Signed)
Pt left v/m requesting urine culture results. Please advise.

## 2013-08-02 ENCOUNTER — Telehealth: Payer: Self-pay

## 2013-08-02 NOTE — Telephone Encounter (Signed)
Stay hydrated.  Urine cx was neg- no abx needed which is good since abx can worsen diarrhea.  No rx needed.  Call us next week with an update.  Go to UC or ER if symptoms worsen over the weekend.

## 2013-08-02 NOTE — Telephone Encounter (Signed)
Spoke to pt and advised per Dr Aron;pt verbally expressed understanding

## 2013-08-02 NOTE — Telephone Encounter (Signed)
Pt called for urine results; pt notified as instructed from result note. Pt said has had diarrhea since 07/29/13.last 24 hrs pt has had 4 watery yellow stools. No N&V and no fever and pt has some lower abdominal pain prior to each BM. Pt is not taking any med for diarrhea. Pt request cb.[facility].

## 2013-08-05 ENCOUNTER — Other Ambulatory Visit: Payer: Self-pay | Admitting: Family Medicine

## 2013-08-06 ENCOUNTER — Other Ambulatory Visit: Payer: Self-pay | Admitting: Family Medicine

## 2013-08-06 NOTE — Telephone Encounter (Signed)
Lm on pt on vm informing her Rx has been called into requested pharmacy

## 2013-08-06 NOTE — Telephone Encounter (Signed)
Pt requesting medication refill. Last ov 07/2013. pls advise

## 2013-08-13 ENCOUNTER — Ambulatory Visit (HOSPITAL_BASED_OUTPATIENT_CLINIC_OR_DEPARTMENT_OTHER): Payer: Medicare Other | Attending: Pulmonary Disease | Admitting: Radiology

## 2013-08-13 VITALS — Ht 63.0 in | Wt 207.0 lb

## 2013-08-13 DIAGNOSIS — J449 Chronic obstructive pulmonary disease, unspecified: Secondary | ICD-10-CM | POA: Diagnosis not present

## 2013-08-13 DIAGNOSIS — J4489 Other specified chronic obstructive pulmonary disease: Secondary | ICD-10-CM | POA: Insufficient documentation

## 2013-08-13 DIAGNOSIS — Z8673 Personal history of transient ischemic attack (TIA), and cerebral infarction without residual deficits: Secondary | ICD-10-CM | POA: Insufficient documentation

## 2013-08-13 DIAGNOSIS — Z79899 Other long term (current) drug therapy: Secondary | ICD-10-CM | POA: Diagnosis not present

## 2013-08-13 DIAGNOSIS — G4733 Obstructive sleep apnea (adult) (pediatric): Secondary | ICD-10-CM | POA: Insufficient documentation

## 2013-08-13 DIAGNOSIS — F39 Unspecified mood [affective] disorder: Secondary | ICD-10-CM | POA: Diagnosis not present

## 2013-08-15 ENCOUNTER — Telehealth: Payer: Self-pay | Admitting: Pulmonary Disease

## 2013-08-15 DIAGNOSIS — G4733 Obstructive sleep apnea (adult) (pediatric): Secondary | ICD-10-CM

## 2013-08-15 NOTE — Telephone Encounter (Signed)
Called spoke with pt. She decided to schedule appt to discuss this with VS first.

## 2013-08-15 NOTE — Sleep Study (Signed)
[place]  NAME: Stephanie [name] DATE OF BIRTH:  Feb 05, 1951 MEDICAL RECORD NUMBER [id number]  LOCATION: [facility]  PHYSICIAN: Chesley Mires, M.D. DATE OF STUDY: 08/13/2013  SLEEP STUDY TYPE: Nocturnal Polysomnogram               REFERRING PHYSICIAN: Chesley Mires, MD  INDICATION FOR STUDY:  63 yo female with snoring, sleep disruption, and daytime sleepiness.  She has hx of CVA, COPD, and mood disorder.  She had sleep study from 11/30/06 which showed AHI 33.3, and sleep study from 02/02/09 showing AHI of 21.  She returns to the sleep lab to further assess for obstructive sleep apnea.  EPWORTH SLEEPINESS SCORE: 5. HEIGHT: 5' 3"  (160 cm)  WEIGHT: 207 lb (93.895 kg)    Body mass index is 36.68 kg/(m^2).  NECK SIZE: 15 in.  MEDICATIONS:  Current Outpatient Prescriptions on File Prior to Visit  Medication Sig Dispense Refill  . ABILIFY 30 MG tablet TAKE 1 TABLET BY MOUTH EVERY MORNING  30 tablet  0  . albuterol (PROAIR HFA) 108 (90 BASE) MCG/ACT inhaler INHALE 2 PUFFS INTO THE LUNGS EVERY 6 (SIX) HOURS AS NEEDED FOR WHEEZING.  8.5 each  3  . clonazePAM (KLONOPIN) 0.5 MG tablet TAKE 1 TABLET BY MOUTH TWICE A DAY AS NEEDED FOR ANXIETY  60 tablet  0  . [facility] STOOL SOFTENER 100 MG capsule TAKE 1 CAPSULE BY MOUTH 2 TIMES DAILY AS NEEDED FOR CONSTIPATION  60 capsule  3  . diazepam (VALIUM) 5 MG tablet TAKE 1 TABLET BY MOUH TWICE A DAY AS NEEDED FOR MUSCLE SPASMS  60 tablet  0  . estradiol (ALORA) 0.1 MG/24HR Place 1 patch (0.1 mg total) onto the skin 2 (two) times a week.  8 patch  12  . lamoTRIgine (LAMICTAL) 100 MG tablet Take 3 tablets by mouth once daily      . LIDODERM 5 % PLACE 1 PATCH ONTO THE SKIN DAILY. REMOVE & DISCARD PATCH WITHIN 12 HOURS OR AS DIRECTED BY MD  30 patch  0  . metFORMIN (GLUCOPHAGE-XR) 750 MG 24 hr tablet 750 mg. TAKE 1 TABLET (750 MG TOTAL) BY MOUTH DAILY WITH BREAKFAST.      . mirtazapine (REMERON) 15 MG tablet TAKE 1 TABLET BY  MOUTH AT BEDTIME  34 tablet  2  . nystatin (MYCOSTATIN/NYSTOP) 100000 UNIT/GM POWD APPLY TOPICALLY 2 TIMES DAILY AS DIRECTED  30 g  0  . nystatin-triamcinolone ointment (MYCOLOG) Apply 1 application topically 2 (two) times daily.  30 g  0  . omeprazole (PRILOSEC) 40 MG capsule TAKE 1 CAPSULE BY MOUTH DAILY.  90 capsule  1  . promethazine (PHENERGAN) 25 MG tablet TAKE 1/2 TO 1 TABLET BY MOUTH EVERY 8 HOURS AS NEEDED FOR NAUSEA  30 tablet  0  . QUEtiapine (SEROQUEL) 50 MG tablet TAKE 3 TABLETS BY MOUTH AT BEDTIME  90 tablet  2  . rOPINIRole (REQUIP) 3 MG tablet TAKE 1 TABLET BY MOUTH AT BEDTIME  90 tablet  0  . salicylic acid 6 % gel APPLY TO AFFECTED AREA EVERY DAY *USE AT NIGHT AND RINSE OFF IN THE MORNING*  40 g  0  . tiotropium (SPIRIVA HANDIHALER) 18 MCG inhalation capsule Place 1 capsule (18 mcg total) into inhaler and inhale daily.  90 capsule  1  . tolterodine (DETROL LA) 2 MG 24 hr capsule TAKE ONE CAPSULE BY MOUTH DAILY  90 capsule  1  . traMADol (ULTRAM)  50 MG tablet TAKE 2 TABLETS BY MOUTH EVERY MORNING AND 2 TABLETS EVERY EVENING AS NEEDED FOR PAIN  120 tablet  1  . urea (CARMOL) 40 % CREA Apply nightly to feet      . vitamin D, CHOLECALCIFEROL, 400 UNITS tablet Take two by mouth daily  6 tablet  6  . zolpidem (AMBIEN) 5 MG tablet TAKE 1/2 TO 1 TABLET BY MOUTH AT BEDTIME  30 tablet  2   No current facility-administered medications on file prior to visit.    SLEEP ARCHITECTURE:  Total recording time: 384 minutes.  Total sleep time was: 330 minutes.  Sleep efficiency: 85.9%.  Sleep latency: 19 minutes.  REM latency: 209 minutes.  Stage N1: 13.6%.  Stage N2: 77.9%.  Stage N3: 0%.  Stage R:  8.5%.  Supine sleep: 181 minutes.  Non-supine sleep: 149 minutes.  RESPIRATORY DATA: Average respiratory rate: 18. Snoring: mild. Average AHI: 3.3.   Apnea index: 0.2.  Hypopnea index: 3.1. Obstructive apnea index: 0.2.  Central apnea index: 0.  Mixed apnea index: 0. REM AHI: 27.9.  NREM AHI:  1. Supine AHI: 3.3. Non-supine AHI: 4.4.  OXYGEN DATA:  Baseline oxygenation: 92%. Lowest SaO2: 82%. Time spent below SaO2 90%: 15.1 minutes. Supplemental oxygen used: None.  CARDIAC DATA:  Average heart rate: 87 beats per minute. Rhythm strip: normal sinus rhythm.  MOVEMENT/PARASOMNIA:  Periodic limb movement: 13.1.  Period limb movements with arousals: 2. Restroom trips: Two.  IMPRESSION/ RECOMMENDATION:   This study shows REM predominant obstructive sleep apnea with REM AHI of 27.9 and SaO2 low of 82%.  She also had oxygen desaturation in the absence of other respiratory events.  This is suggestive of sleep related hypoventilation.  She should return to the sleep lab for a CPAP titration study.  Once established on CPAP she should undergo an overnight oximetry.  She should also receive education about weight loss techniques.   Chesley Mires, M.D. Diplomate, Tax adviser of Sleep Medicine  ELECTRONICALLY SIGNED ON:  08/15/2013, 10:58 AM [place] PH: [phone]   FX: [phone] [place]

## 2013-08-15 NOTE — Telephone Encounter (Signed)
PSG 08/13/13 >> REM AHI 27.9, SaO2 low 82%, Spent 15.1 min with SpO2 < 90%.  Will have my nurse inform pt that she has moderate sleep apnea and low oxygen at night.  Options are to proceed with CPAP titration study in sleep lab, or have ROV first.  If she is agreeable to titration study, then place order for CPAP titration study starting with room air and then transition to BiPAP +/- supplemental oxygen as needed.

## 2013-08-15 NOTE — Telephone Encounter (Signed)
Pt called back and decided to go ahead with CPAP titration study instead. Order placed.

## 2013-08-16 ENCOUNTER — Telehealth: Payer: Self-pay | Admitting: Pulmonary Disease

## 2013-08-16 ENCOUNTER — Other Ambulatory Visit: Payer: Self-pay | Admitting: Internal Medicine

## 2013-08-16 ENCOUNTER — Encounter (HOSPITAL_BASED_OUTPATIENT_CLINIC_OR_DEPARTMENT_OTHER): Payer: Medicare Other

## 2013-08-16 NOTE — Telephone Encounter (Signed)
Called and spoke with pt. Made her aware of what will happen during CPAP titration study. Nothing further needed

## 2013-08-19 ENCOUNTER — Other Ambulatory Visit: Payer: Self-pay | Admitting: Family Medicine

## 2013-08-19 NOTE — Telephone Encounter (Signed)
Pt requesting medication refill. Last ov 07/2013 with no future appts scheduled. pls advise

## 2013-08-19 NOTE — Telephone Encounter (Signed)
Last filled 04/2013 with #[id number]--please advise

## 2013-08-20 NOTE — Telephone Encounter (Signed)
Spoke with pt and informed her Rx has been faxed to requested pharmacy

## 2013-08-23 IMAGING — CR DG LUMBAR SPINE COMPLETE 4+V
5 series · 5 of 5 positions shown · non-contrast
Comparison: Radiographs dated 11/16/2009

CLINICAL DATA: Low back pain.  Failed back syndrome of the lumbar
spine.  Five previous lumbar surgeries.

LUMBAR SPINE - COMPLETE 4+ VIEW

[view not recorded (1 of 5)]
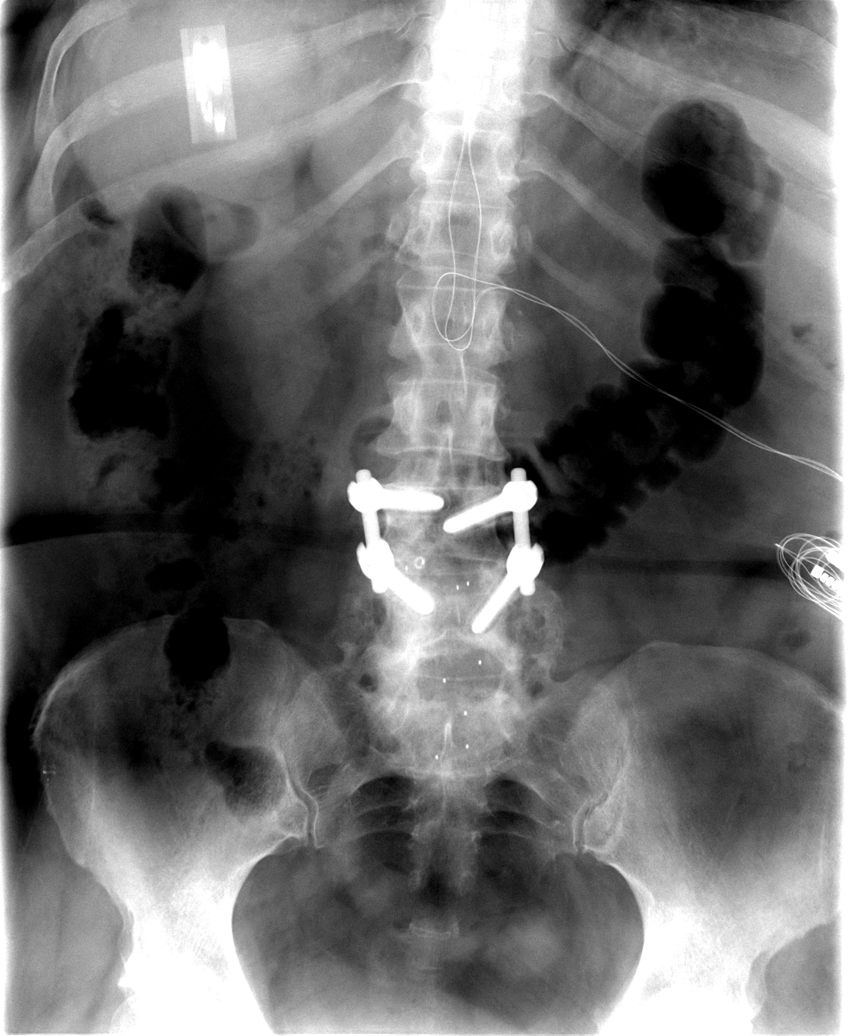

[view not recorded (2 of 5)]
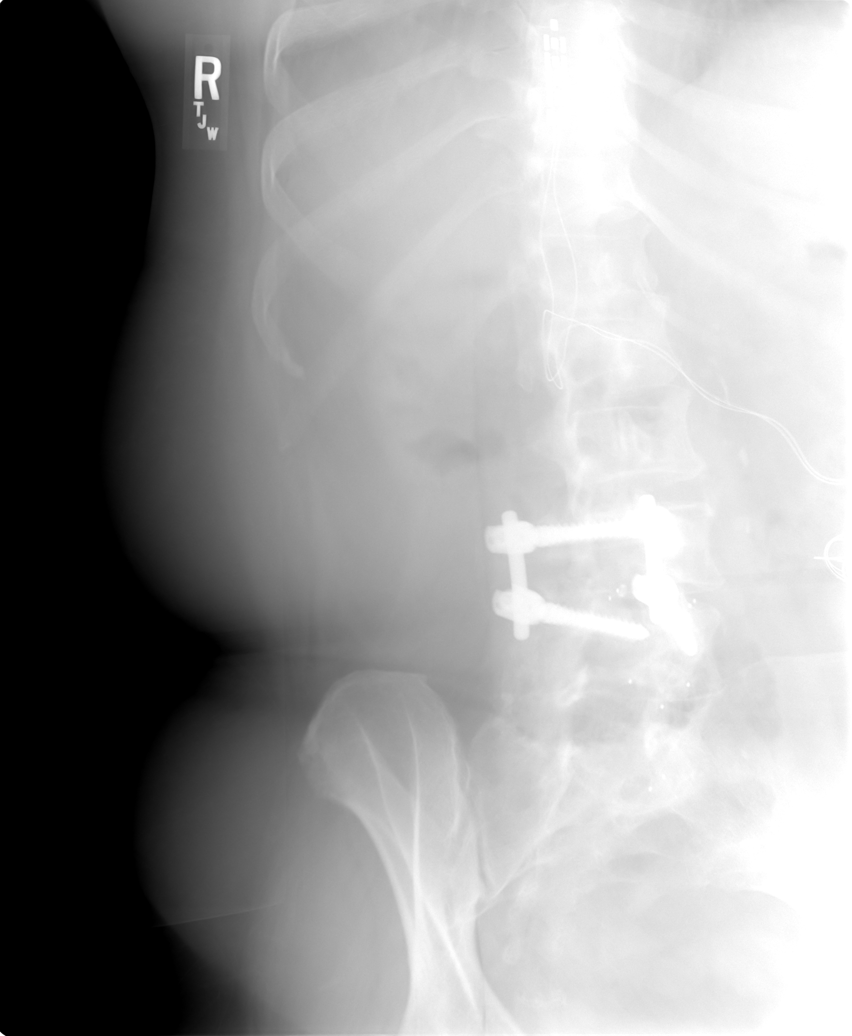

[view not recorded (3 of 5)]
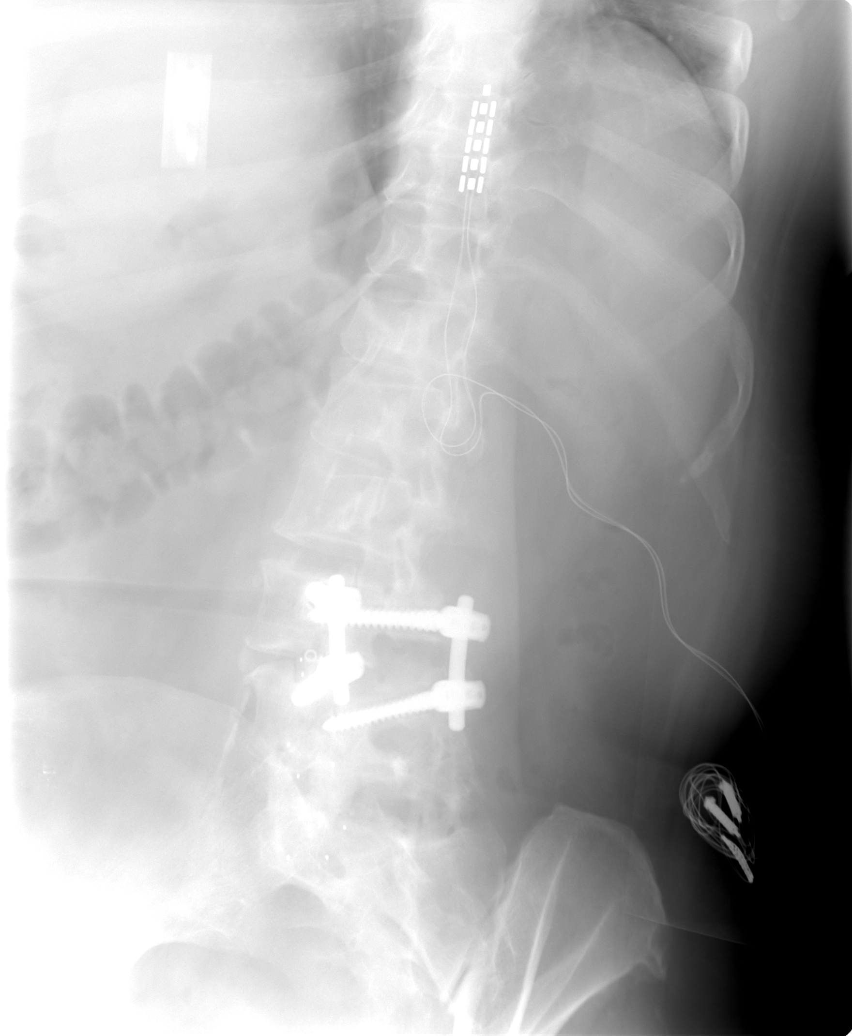

[view not recorded (4 of 5)]
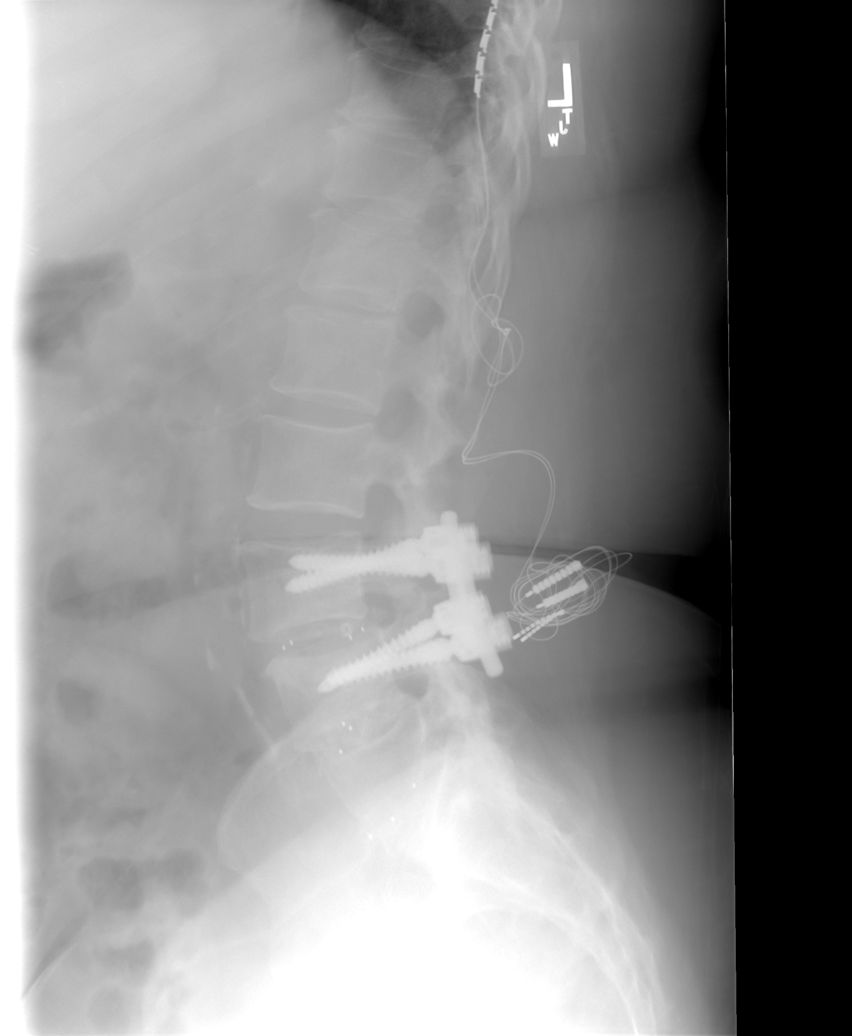

[view not recorded (5 of 5)]
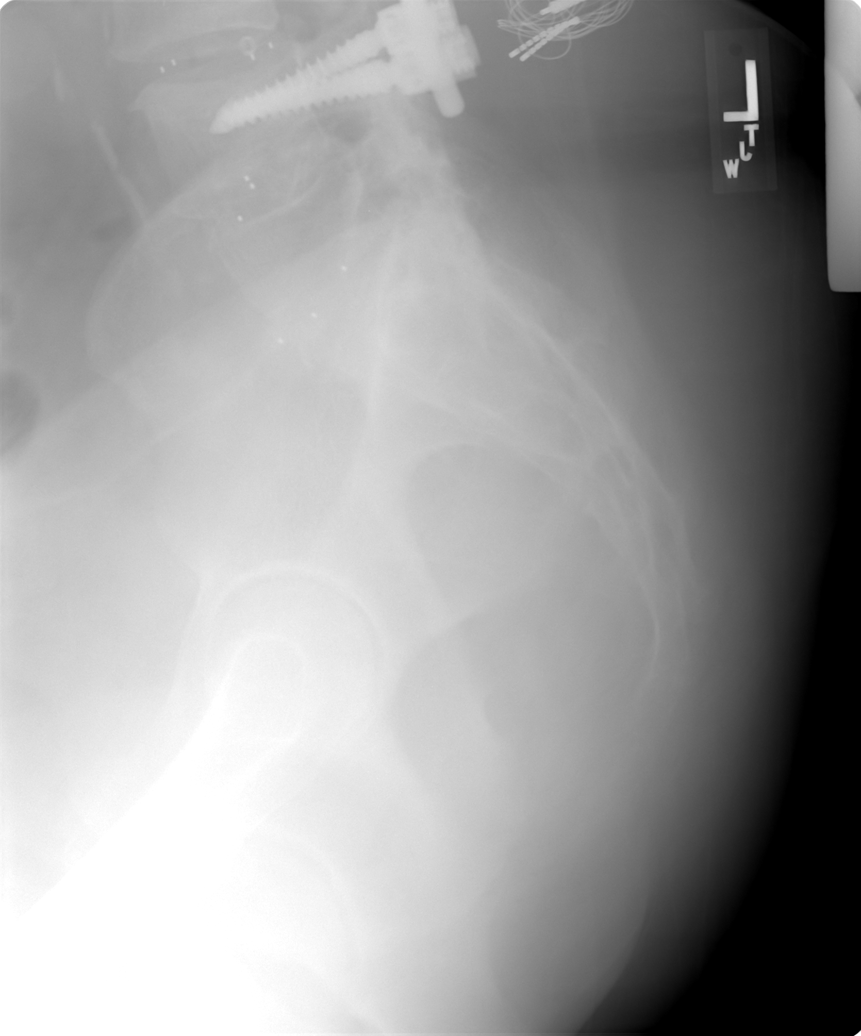

[5 of 5 positions shown; findings below may reference images not displayed]

FINDINGS: There are solid, body and posterior fusions at L4-5 and
L5-L1.  There is nonunion of the fusion at L3-4 with lucency around
the pedicle screws in L3 and L4, unchanged since the prior
radiograph of 11/16/2009.

Neurostimulator device is seen in the lower thoracic spine]. The
wires are not attached to the generator.
IMPRESSION: Stable appearance of the lumbar spine since the prior study.
Nonunion of the fusion at L3-4, chronic.

## 2013-08-26 DIAGNOSIS — H40009 Preglaucoma, unspecified, unspecified eye: Secondary | ICD-10-CM | POA: Diagnosis not present

## 2013-08-26 DIAGNOSIS — H2640 Unspecified secondary cataract: Secondary | ICD-10-CM | POA: Diagnosis not present

## 2013-08-30 ENCOUNTER — Ambulatory Visit: Payer: Medicare Other | Admitting: Pulmonary Disease

## 2013-08-30 DIAGNOSIS — F312 Bipolar disorder, current episode manic severe with psychotic features: Secondary | ICD-10-CM | POA: Diagnosis not present

## 2013-08-30 IMAGING — MG MM CAD SCREENING MAMMO
1 series · 6 of 6 positions shown · non-contrast
Comparison: none

REASON FOR EXAM: SCR MAMMO NO ORDER
COMMENTS:

PROCEDURE:     MAM - MAM DGTL SCRN MAM NO ORDER W/CAD  - January 09, 2013  [DATE]
RESULT:     Bilateral digital screening mammogram with CAD dated 01/09/2013
Comparison study/studies dated: 01/09/2012, 01/06/2011, 01/01/2010, 01/22/2009

[R CC · right · 6 of 6 slices shown]
[im 1/6]
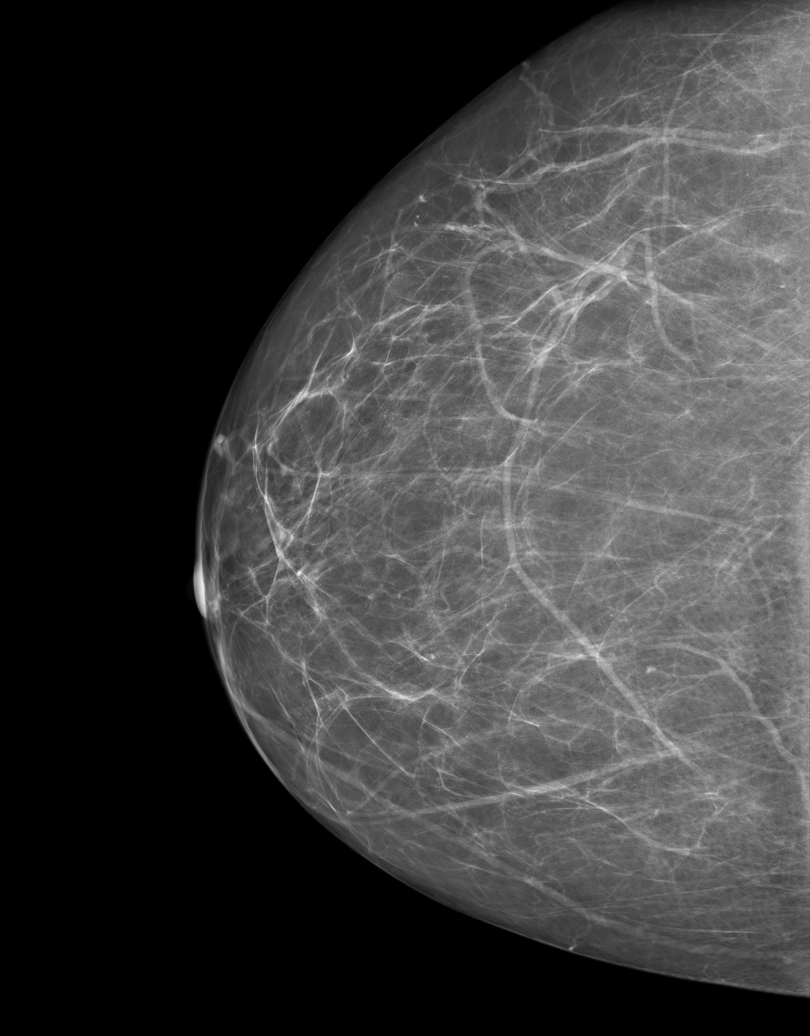
[im 2/6]
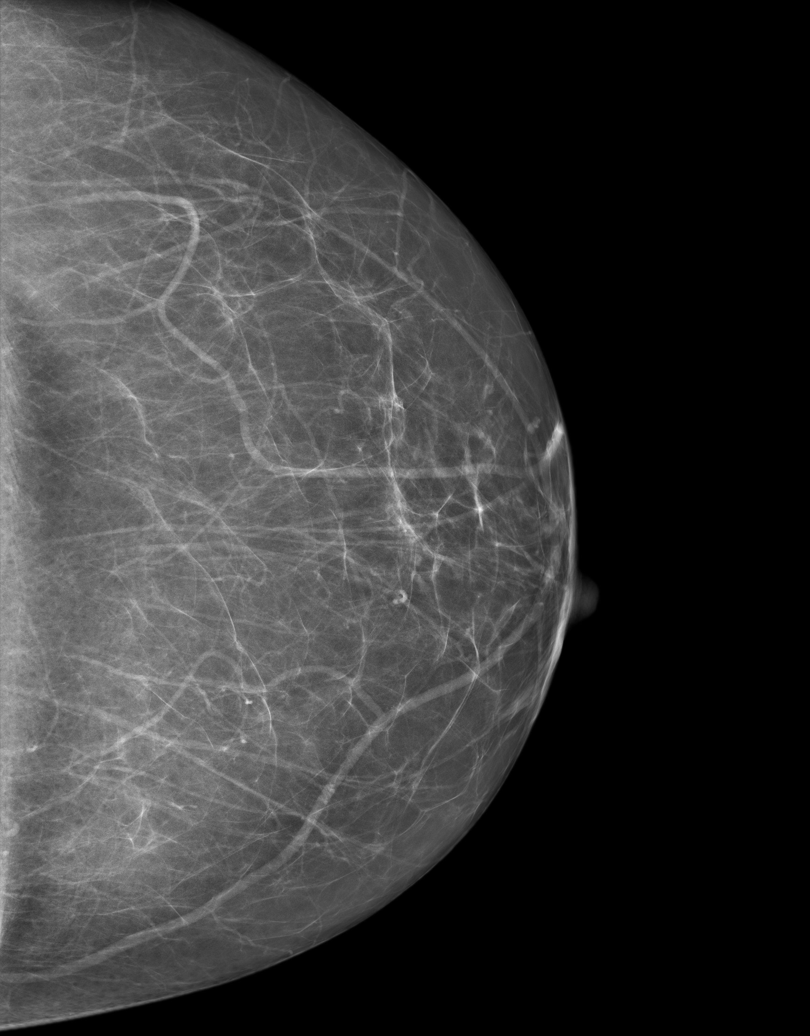
[im 3/6]
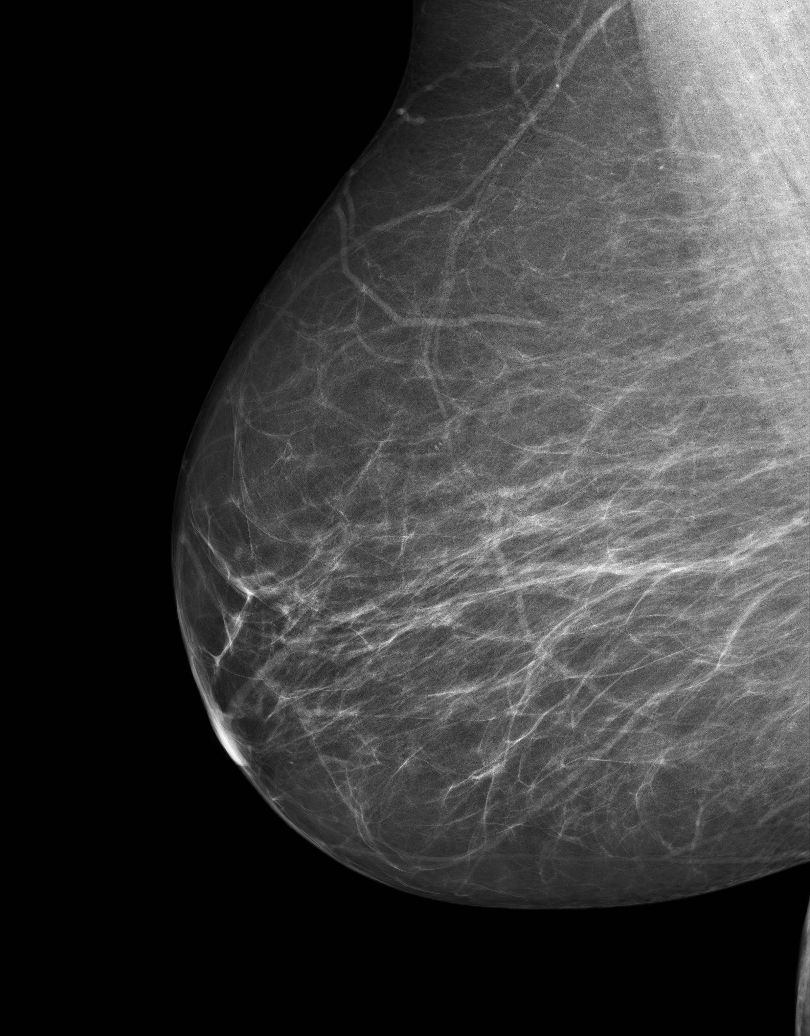
[im 4/6]
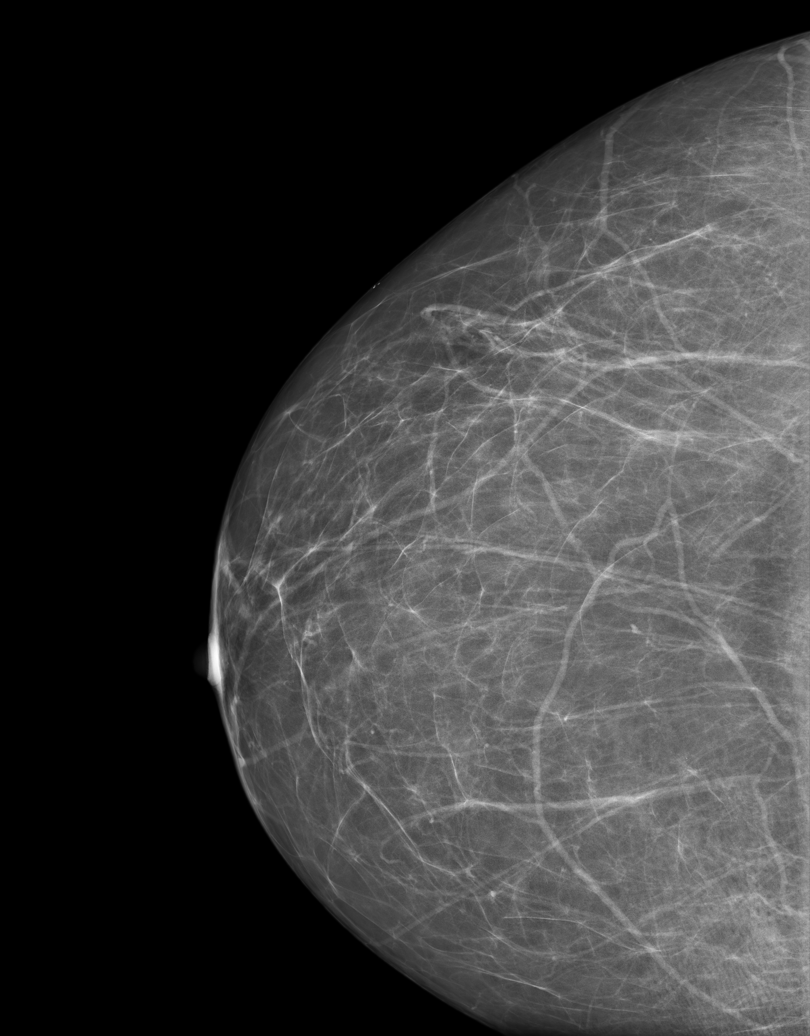
[im 5/6]
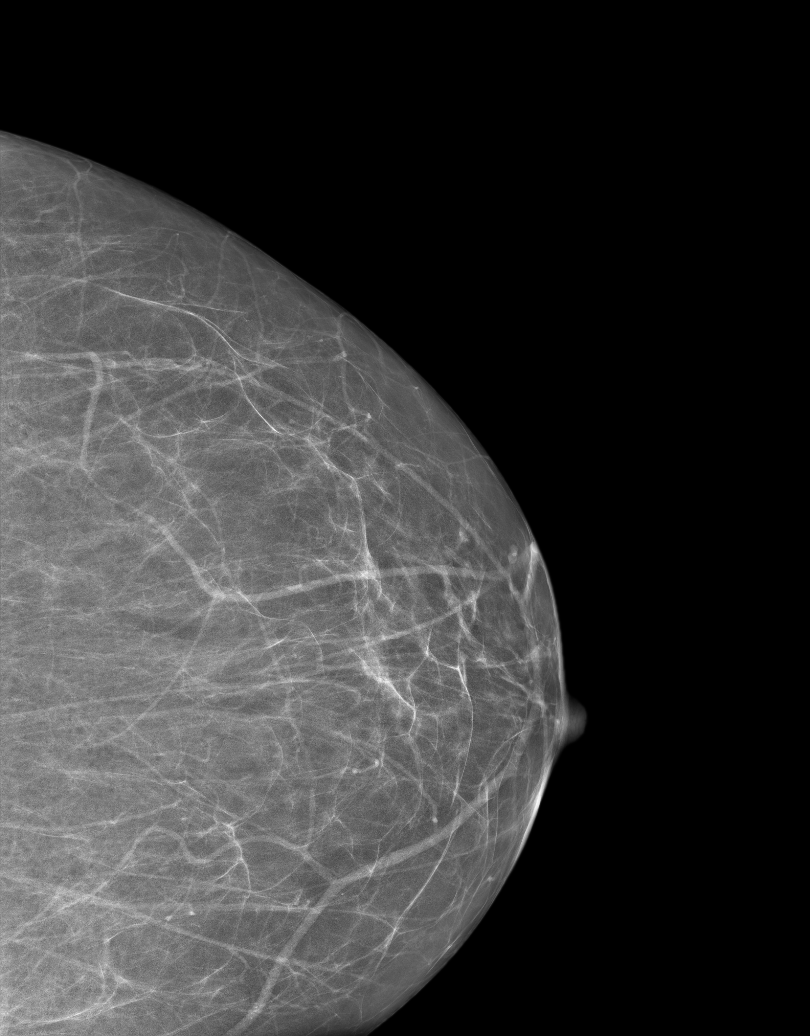
[im 6/6]
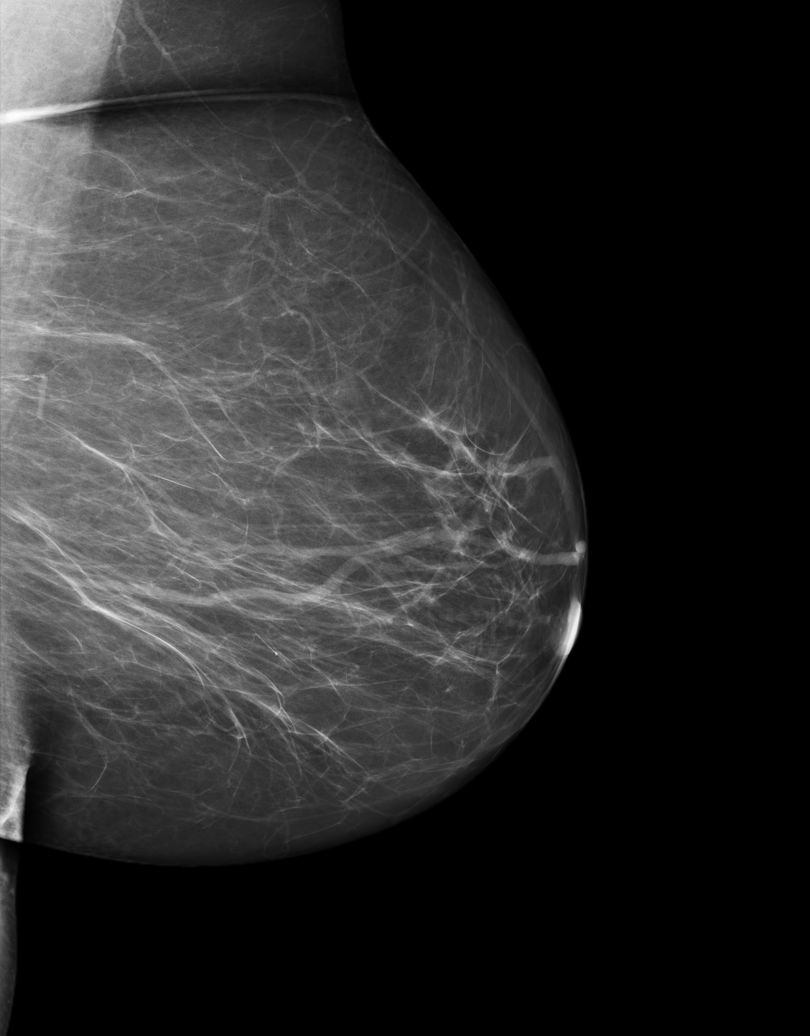

[6 of 6 positions shown; findings below may reference images not displayed]

FINDINGS: BREAST COMPOSITION: The breast composition is SCATTERED FIBROGLANDULAR
TISSUE (glandular tissue is 25-50%)

There is no radiographic evidence of malignant type calcifications ,
architectural distortion, spiculated masses or nodules.
IMPRESSION: BI-RADS: Category 2- Benign Finding

A NEGATIVE MAMMOGRAM REPORT DOES NOT PRECLUDE BIOPSY OR OTHER EVALUATION OF
A CLINICALLY PALPABLE OR OTHERWISE SUSPICIOUS MASS OR LESION. BREAST CANCER
MAY NOT BE DETECTED IN UP TO 10% OF CASES.

## 2013-09-02 ENCOUNTER — Telehealth: Payer: Self-pay

## 2013-09-02 NOTE — Telephone Encounter (Signed)
Pt has billing question; advised to call [phone]; pt said [facility] advised her to call our office; Pt said medicare and medicaid had not received the claim yet; pt said she should have had a breast exam for wellness exam on 07/29/13 and pt had rawness under both breast and pt brought that to Dr Hulen Shouts attention and med was prescribed. Pt does not think she should be charged a well and sick visit. Tried to explain to pt about wellness vs sick exam but  Pt request cb.

## 2013-09-05 ENCOUNTER — Encounter (INDEPENDENT_AMBULATORY_CARE_PROVIDER_SITE_OTHER): Payer: Medicare Other | Admitting: Ophthalmology

## 2013-09-05 DIAGNOSIS — H26499 Other secondary cataract, unspecified eye: Secondary | ICD-10-CM | POA: Diagnosis not present

## 2013-09-05 DIAGNOSIS — H43819 Vitreous degeneration, unspecified eye: Secondary | ICD-10-CM | POA: Diagnosis not present

## 2013-09-05 DIAGNOSIS — E1139 Type 2 diabetes mellitus with other diabetic ophthalmic complication: Secondary | ICD-10-CM | POA: Diagnosis not present

## 2013-09-05 DIAGNOSIS — E1165 Type 2 diabetes mellitus with hyperglycemia: Secondary | ICD-10-CM

## 2013-09-05 DIAGNOSIS — E11319 Type 2 diabetes mellitus with unspecified diabetic retinopathy without macular edema: Secondary | ICD-10-CM | POA: Diagnosis not present

## 2013-09-05 NOTE — Telephone Encounter (Signed)
Pt has MCR and MCD.  Shouldn't be receiving bills for anything.  I E-mailed [employer] billing office for help with the patient's account and contacted the pt by phone.  Pt voiced understanding.

## 2013-09-06 ENCOUNTER — Ambulatory Visit (HOSPITAL_BASED_OUTPATIENT_CLINIC_OR_DEPARTMENT_OTHER): Payer: Medicare Other | Attending: Pulmonary Disease | Admitting: Radiology

## 2013-09-06 VITALS — Ht 63.0 in | Wt 207.0 lb

## 2013-09-06 DIAGNOSIS — G4733 Obstructive sleep apnea (adult) (pediatric): Secondary | ICD-10-CM | POA: Insufficient documentation

## 2013-09-10 ENCOUNTER — Telehealth: Payer: Self-pay | Admitting: Pulmonary Disease

## 2013-09-10 DIAGNOSIS — G4733 Obstructive sleep apnea (adult) (pediatric): Secondary | ICD-10-CM

## 2013-09-10 NOTE — Telephone Encounter (Signed)
lmtcb x1 

## 2013-09-10 NOTE — Sleep Study (Signed)
[place]  NAME: Stephanie [name] DATE OF BIRTH:  May 25, 1951 MEDICAL RECORD NUMBER [id number]  LOCATION: [facility]  PHYSICIAN: Chesley Mires, M.D. DATE OF STUDY: 09/06/2013  SLEEP STUDY TYPE: Nocturnal Polysomnogram               REFERRING PHYSICIAN: Chesley Mires, MD  INDICATION FOR STUDY:  63 year old female found to have moderate sleep apnea on sleep study from 08/13/13 with AHI 27.9 and SaO2 low of 82%.  She returns to the sleep lab for a CPAP titration study.  EPWORTH SLEEPINESS SCORE: 2. HEIGHT: 5' 3"  (160 cm)  WEIGHT: 93.895 kg (207 lb)    Body mass index is 36.68 kg/(m^2).  NECK SIZE: 16 in.  MEDICATIONS:  Current Outpatient Prescriptions on File Prior to Visit  Medication Sig Dispense Refill  . ABILIFY 30 MG tablet TAKE 1 TABLET BY MOUTH EVERY MORNING  30 tablet  0  . albuterol (PROAIR HFA) 108 (90 BASE) MCG/ACT inhaler INHALE 2 PUFFS INTO THE LUNGS EVERY 6 (SIX) HOURS AS NEEDED FOR WHEEZING.  8.5 each  3  . clonazePAM (KLONOPIN) 0.5 MG tablet TAKE 1 TABLET BY MOUTH TWICE A DAY AS NEEDED ANXIETY  60 tablet  0  . [facility] STOOL SOFTENER 100 MG capsule TAKE 1 CAPSULE BY MOUTH 2 TIMES DAILY AS NEEDED FOR CONSTIPATION  60 capsule  3  . diazepam (VALIUM) 5 MG tablet TAKE 1 TABLET BY MOUTH TWICE A DAY AS NEEDED MUSCLE SPASMS  60 tablet  0  . estradiol (ALORA) 0.1 MG/24HR Place 1 patch (0.1 mg total) onto the skin 2 (two) times a week.  8 patch  12  . lamoTRIgine (LAMICTAL) 100 MG tablet Take 3 tablets by mouth once daily      . LIDODERM 5 % PLACE 1 PATCH ONTO THE SKIN DAILY. REMOVE & DISCARD PATCH WITHIN 12 HOURS OR AS DIRECTED BY MD  30 patch  0  . metFORMIN (GLUCOPHAGE-XR) 750 MG 24 hr tablet 750 mg. TAKE 1 TABLET (750 MG TOTAL) BY MOUTH DAILY WITH BREAKFAST.      . mirtazapine (REMERON) 15 MG tablet TAKE 1 TABLET BY MOUTH AT BEDTIME  34 tablet  2  . nystatin (MYCOSTATIN/NYSTOP) 100000 UNIT/GM POWD APPLY TOPICALLY 2 TIMES DAILY AS DIRECTED   30 g  0  . nystatin-triamcinolone ointment (MYCOLOG) Apply 1 application topically 2 (two) times daily.  30 g  0  . omeprazole (PRILOSEC) 40 MG capsule TAKE 1 CAPSULE BY MOUTH DAILY.  90 capsule  1  . promethazine (PHENERGAN) 25 MG tablet TAKE 1/2 TO 1 TABLET BY MOUTH EVERY 8 HOURS AS NEEDED FOR NAUSEA  30 tablet  0  . QUEtiapine (SEROQUEL) 50 MG tablet TAKE 3 TABLETS BY MOUTH AT BEDTIME  90 tablet  2  . rOPINIRole (REQUIP) 3 MG tablet TAKE 1 TABLET BY MOUTH AT BEDTIME  90 tablet  0  . salicylic acid 6 % gel APPLY TO AFFECTED AREA EVERY DAY *USE AT NIGHT AND RINSE OFF IN THE MORNING*  40 g  0  . tiotropium (SPIRIVA HANDIHALER) 18 MCG inhalation capsule Place 1 capsule (18 mcg total) into inhaler and inhale daily.  90 capsule  1  . tolterodine (DETROL LA) 2 MG 24 hr capsule TAKE ONE CAPSULE BY MOUTH DAILY  90 capsule  1  . traMADol (ULTRAM) 50 MG tablet TAKE 2 TABLETS BY MOUTH EVERY MORNING AND 2 TABLETS EVERY EVENING AS NEEDED FOR PAIN  120  tablet  1  . urea (CARMOL) 40 % CREA Apply nightly to feet      . vitamin D, CHOLECALCIFEROL, 400 UNITS tablet Take two by mouth daily  6 tablet  6  . zolpidem (AMBIEN) 5 MG tablet TAKE 1/2 TO 1 TABLET BY MOUTH AT BEDTIME  30 tablet  2   No current facility-administered medications on file prior to visit.    SLEEP ARCHITECTURE:  Total recording time: 428 minutes.  Total sleep time was: 395.5 minutes.  Sleep efficiency: 92.4%.  Sleep latency: 6.5 minutes.  REM latency: 106 minutes.  Stage N1: 2.3%.  Stage N2: 77.1%.  Stage N3: 0%.  Stage R:  20.6%.  Supine sleep: 181.5 minutes.  Non-supine sleep: 214 minutes.  RESPIRATORY DATA: Average respiratory rate: 16. Snoring: Moderate.  She was started on CPAP 5 and increased to 8 cm H2O.  With CPAP at 8 cm H2O her AHI was reduced to 0, and she was observed in REM and supine sleep.  OXYGEN DATA:  Baseline oxygenation: 96%. Lowest SaO2: 78%. Time spent below SaO2 90%: 26.2 minutes. Supplemental oxygen used:  None.  CARDIAC DATA:  Average heart rate: 84 beats per minute. Rhythm strip: Sinus rhythm.  MOVEMENT/PARASOMNIA:  Periodic limb movement: 15.8.  Period limb movements with arousals: 1.5. Restroom trips: One.  IMPRESSION/ RECOMMENDATION:   This was a successful titration study.  She did well with CPAP 8 cm H2O.  She was fitted with a small size Resmed Quattro FX full face mask.    Chesley Mires, M.D. Diplomate, Tax adviser of Sleep Medicine  ELECTRONICALLY SIGNED ON:  09/10/2013, 12:08 AM [place] PH: [phone]   FX: [phone] [place]

## 2013-09-10 NOTE — Telephone Encounter (Signed)
CPAP 09/06/13 >> CPAP 8 >> AHI 0, + R, + S.  Will have my nurse inform pt that she did very well with CPAP.  Will arrange for CPAP 8 cm H2O with heated humidity and mask of choice (please send order).  She will then need ROV 2 months after CPAP set up.

## 2013-09-11 ENCOUNTER — Telehealth: Payer: Self-pay | Admitting: Pulmonary Disease

## 2013-09-11 NOTE — Telephone Encounter (Signed)
Pt is aware of results. Would like order to be sent to [facility] in [place]. Order will be placed.  Recall will also be placed for 2 month ROV.

## 2013-09-11 NOTE — Telephone Encounter (Signed)
Duplicate message.  See telephone note from 09/10/13.

## 2013-09-15 ENCOUNTER — Other Ambulatory Visit: Payer: Self-pay | Admitting: Family Medicine

## 2013-09-23 ENCOUNTER — Other Ambulatory Visit: Payer: Self-pay | Admitting: Family Medicine

## 2013-09-23 ENCOUNTER — Telehealth: Payer: Self-pay | Admitting: Pulmonary Disease

## 2013-09-23 NOTE — Telephone Encounter (Signed)
Stephanie [name] --Inge Rise, CMA            I checked back in staff messages around 4/8 and couldn't find anything on this pt. I have pulled everything for [name] to process the referral now. Thank you   --   Called and made pt aware. Nothing further needed

## 2013-09-23 NOTE — Telephone Encounter (Signed)
Spoke with pt She has not heard back about getting set up on CPAP. Order was placed 09/11/13 I can;t tell if anything has been done with order I sent [facility] a staff message Will await response back

## 2013-09-24 NOTE — Telephone Encounter (Signed)
Pt requesting medication refill. Last ov 07/2013 with no future appts scheduled. Pls advise

## 2013-09-24 NOTE — Telephone Encounter (Signed)
Spoke to pt and informed her Rx has been faxed to requested pharmacy

## 2013-09-25 ENCOUNTER — Telehealth: Payer: Self-pay | Admitting: Pulmonary Disease

## 2013-09-25 DIAGNOSIS — G4733 Obstructive sleep apnea (adult) (pediatric): Secondary | ICD-10-CM

## 2013-09-25 NOTE — Telephone Encounter (Signed)
If this will be an issue with [facility], then please have [facility] arrange for different DME.

## 2013-09-25 NOTE — Telephone Encounter (Signed)
I spoke with VS myself-he is aware that Insurance will NOT cover CPAP no matter the DME company. He stated to call and inform the patient and suggest repeating a NPSG. Pt is agreeable to the NPSG again and order placed.

## 2013-09-25 NOTE — Telephone Encounter (Signed)
Spoke with Melissa at [facility]- she state that the sleep study patient had done shows AHI of 3.3 and REM AHI  27.9. Insurance will cover CPAP therapy for patient-she will have to be private pay as AHI is not 5 or higher.   VS please advise. Thanks.

## 2013-09-25 NOTE — Telephone Encounter (Signed)
VS this is will not be an issue with any of the DME companies but it is all related to her insurance company.  Please advise. thanks

## 2013-09-27 ENCOUNTER — Other Ambulatory Visit: Payer: Self-pay | Admitting: Family Medicine

## 2013-09-28 ENCOUNTER — Other Ambulatory Visit: Payer: Self-pay | Admitting: Family Medicine

## 2013-09-30 ENCOUNTER — Other Ambulatory Visit: Payer: Self-pay | Admitting: Family Medicine

## 2013-09-30 NOTE — Telephone Encounter (Signed)
Pt left v/m requesting status of refills for clonazepam and valium . Pt request cb.

## 2013-09-30 NOTE — Telephone Encounter (Signed)
Spoke to pt and informed her Rx has been faxed to requested pharmacy

## 2013-09-30 NOTE — Telephone Encounter (Signed)
Pt requesting medication refill. Last ov 07/2013-CPE with no future appts scheduled. pls advise

## 2013-10-02 LAB — HM DIABETES EYE EXAM

## 2013-10-03 ENCOUNTER — Ambulatory Visit (INDEPENDENT_AMBULATORY_CARE_PROVIDER_SITE_OTHER): Payer: Medicare Other | Admitting: Ophthalmology

## 2013-10-03 ENCOUNTER — Other Ambulatory Visit: Payer: Self-pay | Admitting: Family Medicine

## 2013-10-03 DIAGNOSIS — H26499 Other secondary cataract, unspecified eye: Secondary | ICD-10-CM | POA: Diagnosis not present

## 2013-10-04 ENCOUNTER — Telehealth: Payer: Self-pay | Admitting: Pulmonary Disease

## 2013-10-04 NOTE — Telephone Encounter (Signed)
ATC PT line busy x 4 wcb

## 2013-10-04 NOTE — Telephone Encounter (Signed)
Pt requesting medication refill. Last ov 07/2013 with no future appts scheduled. pls advise

## 2013-10-04 NOTE — Telephone Encounter (Signed)
Lm on pts vm informing her Rx has been called in to requested pharmacy

## 2013-10-07 NOTE — Telephone Encounter (Signed)
Spoke with pt and she was wanting to make sure her sleep study will be covered by insurance.  I spoke with [place] and she states that pt has Medicare and Medicaid and this does not need to be precerted and will be covered.

## 2013-10-07 NOTE — Telephone Encounter (Signed)
Pt returning call

## 2013-10-08 ENCOUNTER — Telehealth: Payer: Self-pay

## 2013-10-08 ENCOUNTER — Telehealth: Payer: Self-pay | Admitting: Family Medicine

## 2013-10-08 ENCOUNTER — Emergency Department: Payer: Self-pay | Admitting: Emergency Medicine

## 2013-10-08 DIAGNOSIS — F319 Bipolar disorder, unspecified: Secondary | ICD-10-CM | POA: Diagnosis not present

## 2013-10-08 DIAGNOSIS — F17200 Nicotine dependence, unspecified, uncomplicated: Secondary | ICD-10-CM | POA: Diagnosis not present

## 2013-10-08 DIAGNOSIS — I10 Essential (primary) hypertension: Secondary | ICD-10-CM | POA: Diagnosis not present

## 2013-10-08 DIAGNOSIS — E119 Type 2 diabetes mellitus without complications: Secondary | ICD-10-CM | POA: Diagnosis not present

## 2013-10-08 LAB — URINALYSIS, COMPLETE
Bilirubin,UR: NEGATIVE
Blood: NEGATIVE
GLUCOSE, UR: NEGATIVE mg/dL (ref 0–75)
Ketone: NEGATIVE
LEUKOCYTE ESTERASE: NEGATIVE
NITRITE: NEGATIVE
Ph: 6 (ref 4.5–8.0)
Protein: 30
RBC, UR: NONE SEEN /HPF (ref 0–5)
SPECIFIC GRAVITY: 1.025 (ref 1.003–1.030)
Squamous Epithelial: NONE SEEN

## 2013-10-08 LAB — COMPREHENSIVE METABOLIC PANEL
ALT: 35 U/L (ref 12–78)
Albumin: 3.3 g/dL — ABNORMAL LOW (ref 3.4–5.0)
Alkaline Phosphatase: 181 U/L — ABNORMAL HIGH
Anion Gap: 3 — ABNORMAL LOW (ref 7–16)
BUN: 14 mg/dL (ref 7–18)
Bilirubin,Total: 0.4 mg/dL (ref 0.2–1.0)
CHLORIDE: 107 mmol/L (ref 98–107)
CO2: 29 mmol/L (ref 21–32)
CREATININE: 0.9 mg/dL (ref 0.60–1.30)
Calcium, Total: 9 mg/dL (ref 8.5–10.1)
EGFR (African American): >60
Glucose: 129 mg/dL — ABNORMAL HIGH (ref 65–99)
Osmolality: 280 (ref 275–301)
Potassium: 4.2 mmol/L (ref 3.5–5.1)
SGOT(AST): 48 U/L — ABNORMAL HIGH (ref 15–37)
Sodium: 139 mmol/L (ref 136–145)
Total Protein: 7.4 g/dL (ref 6.4–8.2)

## 2013-10-08 LAB — CBC
HCT: 41.6 % (ref 35.0–47.0)
HGB: 13.2 g/dL (ref 12.0–16.0)
MCH: 28.9 pg (ref 26.0–34.0)
MCHC: 31.8 g/dL — AB (ref 32.0–36.0)
MCV: 91 fL (ref 80–100)
Platelet: 171 10*3/uL (ref 150–440)
RBC: 4.58 10*6/uL (ref 3.80–5.20)
RDW: 14.5 % (ref 11.5–14.5)
WBC: 7 10*3/uL (ref 3.6–11.0)

## 2013-10-08 NOTE — Telephone Encounter (Addendum)
No available appts; spoke with a provider who could not open an extra appt this afternoon; should pt be directed to go to ED or what to do? Spoke with Butch Penny at [facility] and they do not have any available opening today. Spoke with pt and advised pt should be seen today per Dr Deborra Medina and pt will go to [facility] ED.

## 2013-10-08 NOTE — Telephone Encounter (Signed)
Pt called back and wanted to know if could wait for appt on another day; advised pt per Dr Deborra Medina with her BS that high along with other symptoms pt needs to be seen today. Pt voiced understanding and said she will go to ED and then call for appt with Dr Deborra Medina later.

## 2013-10-08 NOTE — Telephone Encounter (Signed)
Pt was seen at [facility] and was given rx for  [facility] diabetic test strips and pt request order for lancets. Could not find Breeze lancets in Epic and when I spoke with pharmacist at [facility] he said he was having to contact the ED doctor about rx that was written and he would get lancet order from ED physician. Pt notified and was agreeable.

## 2013-10-08 NOTE — Telephone Encounter (Signed)
Patient Information:  Caller Name: Destani  Phone: [phone]  Patient: Stephanie [name]  Gender: Female  DOB: 1951-01-15  Age: 63 Years  PCP: [name] [facility])  Office Follow Up:  Does the office need to follow up with this patient?: Yes  Instructions For The Office: Patient is needing direction on how to lower BG and when she can be seen in the office. Elevated BG> 300 over the weekend and feeling dizzy and nauseous at times.   Symptoms  Reason For Call & Symptoms: Feeing Nausea and dizziness on 09/06/13 -checked  BG= 335. She layed down and rested and sx improved.  She has been drinking alot of water but only urinating 4 x daily.  This morning BG=293 before breakfast-09/08/13. She is taking Metformin as prescribed. She feels like she is breathing faster than normal for past week- using inhalers.  Edema in lower legs on and off that she has never mentioned to Dr. Marjory Lies.  She has been craving sweets and has been drinking 1/2 glass of Coke for past month. She has gained 17 lbs since beginning of the year. Requesting OV to having labwork done and to see if needing to change meds.   Reviewed Health History In EMR: Yes  Reviewed Medications In EMR: Yes  Reviewed Allergies In EMR: Yes  Reviewed Surgeries / Procedures: Yes  Date of Onset of Symptoms: 10/06/2013  Guideline(s) Used:  Diabetes - High Blood Sugar  Disposition Per Guideline:   See Today in Office  Reason For Disposition Reached:   Patient wants to be seen  Advice Given:  General  Symptoms of mild hyperglycemia: frequent urination, increased thirst, fatigue, blurred vision.  Symptoms of severe hyperglycemia: weakness, progressing to confusion and coma.  Treatment - Liquids  Drink at least one glass (8 oz or 240 ml) of water per hour for the next 4 hours. (Reason: adequate hydration will reduce hyperglycemia).  Generally, you should try to drink 6-8 glasses of water each day.  Treatment - Diabetes Medications  :  Continue taking your diabetes pills.  Measure and Record Your Blood Glucose  Every day you should measure your blood glucose before breakfast and before going to bed.  Record the results and show them to your doctor at your next office visit.  Expected Course  Your blood sugar continues to get above 240 mg/dl (13 mmol/l).  Your blood sugar continues to be higher than your daily glucose goals (set by you and your doctor).  Call Back If:  Urine ketones become moderate or large  Vomiting lasting more than 4 hours or unable to drink any liquids.  Rapid breathing occurs  You become worse.  Patient Will Follow Care Advice:  YES

## 2013-10-08 NOTE — Telephone Encounter (Signed)
Yes she definitely needs to be seen as soon as possible.  Is there someone who can work her in today?

## 2013-10-09 ENCOUNTER — Telehealth: Payer: Self-pay

## 2013-10-09 NOTE — Telephone Encounter (Signed)
pts called requesting the glucometer to be prescribed today; pt still does not feel good,  Pt still having nausea and seeing spots on and off; also feels weak. Pt request the Breeze II disc instead of test strips. Pt request cb when diabetic supplies rx are sent to [place].

## 2013-10-09 NOTE — Telephone Encounter (Signed)
Stephanie [name] from [facility] ED said [name] is requesting additional info on rx written by ED doctor on 10/08/13; ED doctor that pt saw on 10/08/13 is not in today; Stephanie [name] request PCP order glucometer, diabetic test strips and lancets to [facility]. Stephanie [name] said pts BS was 129 at [facility] ED so OK to wait until 10/10/13 to request from Dr Deborra Medina. Stephanie [name] will notify pharmacist and pt that glucometer and supplies will come from Dr Deborra Medina. Stephanie [name] said pt request Breeze II meter and supplies.

## 2013-10-09 NOTE — Telephone Encounter (Signed)
Dr. Loni Muse pt

## 2013-10-10 MED ORDER — BREEZE 2 BLOOD GLUCOSE SYSTEM DEVI: Status: DC

## 2013-10-10 MED ORDER — MICROLET LANCETS MISC: Status: DC

## 2013-10-10 MED ORDER — GLUCOSE BLOOD VI DISK: DISK | Status: DC

## 2013-10-10 NOTE — Telephone Encounter (Signed)
Pt said she is still feeling bad, nausea and seeing spots after nausea; pt said ins will not pay for glucometer and diabetic supplies due to no dx. Pt scheduled appt to see Dr Deborra Medina on 10/14/13 at 10 AM; pt will come fasting. Pt will come today to pick up Contour next meter with 10 test strips until seen 10/14/13. If pts condition changes or worsens prior to appt pt will call office or go to UC.

## 2013-10-10 NOTE — Telephone Encounter (Signed)
She was not a diabetic when we last checked 6 months ago but that may have changed based on what was found in ER.

## 2013-10-10 NOTE — Telephone Encounter (Signed)
Ok, patient is requesting this be sent to the pharmacy, I even spoke with the pharmacist to get this correct and he states her insurance may not pay. rx sent to pharmacy by e-script

## 2013-10-10 NOTE — Telephone Encounter (Signed)
Spoke with patient and asked if she was diabetic and she states that everyone she talk to tells her she's diabetic, I tried to explain I don't have a diagoses of diabetes and that her insurance may not pay.  Dr. Deborra Medina what diagnose code do I use? Is she diabetic? And is this ok to fill?

## 2013-10-14 ENCOUNTER — Telehealth: Payer: Self-pay

## 2013-10-14 ENCOUNTER — Ambulatory Visit (INDEPENDENT_AMBULATORY_CARE_PROVIDER_SITE_OTHER): Payer: Medicare Other | Admitting: Family Medicine

## 2013-10-14 ENCOUNTER — Encounter: Payer: Self-pay | Admitting: Family Medicine

## 2013-10-14 VITALS — BP 130/84 | HR 103 | Temp 97.6°F | Wt 211.0 lb

## 2013-10-14 DIAGNOSIS — R7309 Other abnormal glucose: Secondary | ICD-10-CM | POA: Diagnosis not present

## 2013-10-14 DIAGNOSIS — E785 Hyperlipidemia, unspecified: Secondary | ICD-10-CM

## 2013-10-14 DIAGNOSIS — E1165 Type 2 diabetes mellitus with hyperglycemia: Secondary | ICD-10-CM | POA: Insufficient documentation

## 2013-10-14 DIAGNOSIS — E118 Type 2 diabetes mellitus with unspecified complications: Secondary | ICD-10-CM

## 2013-10-14 DIAGNOSIS — R739 Hyperglycemia, unspecified: Secondary | ICD-10-CM

## 2013-10-14 LAB — COMPREHENSIVE METABOLIC PANEL
ALBUMIN: 3.4 g/dL — AB (ref 3.5–5.2)
ALT: 33 U/L (ref 0–35)
AST: 53 U/L — ABNORMAL HIGH (ref 0–37)
Alkaline Phosphatase: 136 U/L — ABNORMAL HIGH (ref 39–117)
BUN: 16 mg/dL (ref 6–23)
CO2: 28 mEq/L (ref 19–32)
Calcium: 9.2 mg/dL (ref 8.4–10.5)
Chloride: 105 mEq/L (ref 96–112)
Creatinine, Ser: 0.9 mg/dL (ref 0.4–1.2)
GFR: 69.86 mL/min (ref >60.00)
GLUCOSE: 127 mg/dL — AB (ref 70–99)
POTASSIUM: 4.4 meq/L (ref 3.5–5.1)
SODIUM: 141 meq/L (ref 135–145)
TOTAL PROTEIN: 7 g/dL (ref 6.0–8.3)
Total Bilirubin: 0.6 mg/dL (ref 0.2–1.2)

## 2013-10-14 LAB — MICROALBUMIN / CREATININE URINE RATIO
Creatinine,U: 116.7 mg/dL
MICROALB UR: 0.8 mg/dL (ref 0.0–1.9)
Microalb Creat Ratio: 0.7 mg/g (ref 0.0–30.0)

## 2013-10-14 LAB — HEMOGLOBIN A1C: HEMOGLOBIN A1C: 6.5 % (ref 4.6–6.5)

## 2013-10-14 MED ORDER — SIMVASTATIN 10 MG PO TABS: 10.0000 mg | ORAL_TABLET | Freq: Every day | ORAL | Status: DC

## 2013-10-14 MED ORDER — METFORMIN HCL 1000 MG PO TABS: 1000.0000 mg | ORAL_TABLET | Freq: Two times a day (BID) | ORAL | Status: DC

## 2013-10-14 NOTE — Assessment & Plan Note (Signed)
Now with diabetes- will verify with a1c and get records from [facility]. Increase Metformin to 1000 mg twice daily. Check a1c and urine micro.

## 2013-10-14 NOTE — Progress Notes (Signed)
Subjective:   Patient ID: Stephanie [name], female    DOB: [birth year], 63 y.o.   MRN: [id number]  Stephanie [name] is a pleasant 63 y.o. year old female who presents to clinic today for hospital follow up/ Diabetes  on 10/14/2013  HPI: PMH of hyperglycemia/ borderline DM.  Has been on Metformin XR 750 mg daily. Went to [facility] ER because FSBS was in 300s.  Those notes have not been sent to [name]- we have just requested them.  Per pt, her medication was changed but she has not picked it up because she is "not considered a diabetic and insurance will not cover it."  Checking FSBS at home- was in 200s fasting this weeked.  Has had increased thirst and urination.  Last a1c was less than 6.5 in 04/2013.  Has been eating more sweet and gained weight.  Lab Results  Component Value Date   HGBA1C 6.3 04/12/2013   Lab Results  Component Value Date   CHOL 251* 07/22/2013   HDL 96.50 07/22/2013   LDLDIRECT 143.4 07/22/2013   TRIG 135.0 07/22/2013   CHOLHDL 3 07/22/2013   Wt Readings from Last 3 Encounters:  10/14/13 211 lb (95.709 kg)  09/06/13 207 lb (93.895 kg)  08/13/13 207 lb (93.895 kg)   Patient Active Problem List   Diagnosis Date Noted  . Hyperglycemia 10/14/2013  . Medicare annual wellness visit, subsequent 07/29/2013  . DNR (do not resuscitate) 07/29/2013  . Candidal intertrigo 07/29/2013  . Encounter for routine gynecological examination 07/29/2013  . Foul smelling urine 07/29/2013  . Dyspnea 07/10/2013  . Lung nodule 07/10/2013  . Right foot pain 05/20/2013  . Varicose veins of lower extremities with other complications 60/73/7106  . GERD (gastroesophageal reflux disease) 10/09/2012  . Cirrhosis of liver 02/02/2012  . Anxiety 07/11/2011  . Aortic dissection   . BACK PAIN, LUMBAR, CHRONIC 11/19/2009  . UNS ADVRS EFF OTH RX MEDICINAL&BIOLOGICAL SBSTNC 12/16/2008  . UNSPECIFIED VITAMIN D DEFICIENCY 09/26/2008  . TOBACCO ABUSE 06/19/2008  . RESTLESS LEGS SYNDROME 04/27/2007  .  INSOMNIA 04/27/2007  . Benign Neoplasm of Colon 04/04/2007  . HYPERLIPIDEMIA 03/08/2007  . OBSTRUCTIVE SLEEP APNEA 03/08/2007  . HYPERTENSION 03/08/2007  . COPD 03/08/2007  . BIPOLAR AFFECTIVE DISORDER, HX OF 03/08/2007   Past Medical History  Diagnosis Date  . Adenomatous colon polyp   . Insomnia   . RLS (restless legs syndrome)   . Colon polyp   . Incontinence   . Hepatitis B, chronic   . H/O: rheumatic fever   . Hyperlipidemia   . Unspecified disorders of nervous system   . Bipolar affective disorder     h/o  . H/O: CVA (cardiovascular accident)     TIA  . Fatty liver 04/09/08    found in abd CT  . Shortness of breath   . Obstructive sleep apnea     not using CPAP, last sleep study 2.5 years ago, per pt doctor is aware  . Blood transfusion 2000  . GERD (gastroesophageal reflux disease)   . Arthritis   . Aortic dissection     Type 1  . COPD (chronic obstructive pulmonary disease)   . Anxiety   . Depression   . Heart murmur     as a child  . Stroke     TIA- 2002  . Ulcer    Past Surgical History  Procedure Laterality Date  . Back surgery      x 45  . Cholecystectomy    .  Rotator cuff repair      left  . Abdominal hysterectomy      total  . Lumbar fusion  10/09  . Hemorroidectomy      and colon polyp removed  . Abdominal exploration surgery    . Posterior cervical fusion/foraminotomy    . Eye surgery      bilat  . Lumbar wound debridement  05/09/2011    Procedure: LUMBAR WOUND DEBRIDEMENT;  Surgeon: Dahlia Bailiff;  Location: [place];  Service: Orthopedics;  Laterality: N/A;  IRRIGATION AND DEBRIDEMENT SPINAL WOUND  . Axillary artery cannulation via 8-mm hemashield graft, median sternotomy, extracorporeal circulation with deep hypothermic circulatory arrest, repair of  aortic dessection  01/23/2009    Dr Arlyce Dice  . Cataract      both eyes  . Colonoscopy    . Liver biopsy  04/10/2012    Procedure: LIVER BIOPSY;  Surgeon: Inda Castle, MD;  Location: [facility]  ENDOSCOPY;  Service: Endoscopy;  Laterality: N/A;  ultrasound to mark order for abd limited/liver to be marked to be sent by [email or web address]    History  Substance Use Topics  . Smoking status: Former Smoker -- 0.50 packs/day for 50 years    Types: Cigarettes    Quit date: 04/06/2013  . Smokeless tobacco: Never Used  . Alcohol Use: No   Family History  Problem Relation Age of Onset  . Colon cancer Neg Hx   . Esophageal cancer Neg Hx   . Rectal cancer Neg Hx   . Breast cancer Mother   . Lung cancer Mother   . Stomach cancer Father   . Diabetes      4 aunts, and 1 uncle   No Known Allergies Current Outpatient Prescriptions on File Prior to Visit  Medication Sig Dispense Refill  . ABILIFY 30 MG tablet TAKE 1 TABLET BY MOUTH EVERY MORNING  30 tablet  0  . albuterol (PROAIR HFA) 108 (90 BASE) MCG/ACT inhaler INHALE 2 PUFFS INTO THE LUNGS EVERY 6 (SIX) HOURS AS NEEDED FOR WHEEZING.  8.5 each  3  . ALORA 0.1 MG/24HR patch PLACE 1 PATCH (0.1 MG TOTAL) ONTO THE SKIN 2 (TWO) TIMES A WEEK.  8 patch  3  . Blood Glucose Monitoring Suppl (BREEZE 2 BLOOD GLUCOSE SYSTEM) DEVI Use to test blood sugar once daily  1 Device  0  . clonazePAM (KLONOPIN) 0.5 MG tablet TAKE 1 TABLET BY MOUTH 2 TIMES A DAY AS NEEDED FOR ANXIETY  60 tablet  0  . CVS STOOL SOFTENER 100 MG capsule TAKE 1 CAPSULE BY MOUTH 2 TIMES DAILY AS NEEDED FOR CONSTIPATION  60 capsule  3  . diazepam (VALIUM) 5 MG tablet TAKE 1 TABLET BY MOUTH 2 TIMES A DAY AS NEEDED FOR MUSCLE SPASMS  60 tablet  0  . Glucose Blood (BAYER BREEZE 2 TEST) DISK Use to test blood sugar once daily  30 each  1  . lamoTRIgine (LAMICTAL) 100 MG tablet Take 3 tablets by mouth once daily      . lidocaine (LIDODERM) 5 % PLACE 1 PATCH ONTO THE SKIN DAILY. REMOVE & DISCARD PATCH WITHIN 12 HOURS OR AS DIRECTED BY MD  30 patch  0  . MICROLET LANCETS MISC Use to test blood sugar once daily  100 each  0  . mirtazapine (REMERON) 15 MG tablet TAKE 1 TABLET BY MOUTH AT BEDTIME   34 tablet  2  . nystatin (MYCOSTATIN/NYSTOP) 100000 UNIT/GM POWD APPLY TOPICALLY 2 TIMES DAILY AS DIRECTED  30 g  0  . nystatin-triamcinolone ointment (MYCOLOG) Apply 1 application topically 2 (two) times daily.  30 g  0  . omeprazole (PRILOSEC) 40 MG capsule TAKE 1 CAPSULE BY MOUTH DAILY.  90 capsule  1  . promethazine (PHENERGAN) 25 MG tablet TAKE 1/2 TO 1 TABLET BY MOUTH EVERY 8 HOURS AS NEEDED FOR NAUSEA  30 tablet  0  . QUEtiapine (SEROQUEL) 50 MG tablet TAKE 3 TABLETS BY MOUTH AT BEDTIME  90 tablet  2  . rOPINIRole (REQUIP) 3 MG tablet TAKE 1 TABLET BY MOUTH AT BEDTIME  90 tablet  0  . salicylic acid 6 % gel APPLY TO AFFECTED AREA EVERY DAY *USE AT NIGHT AND RINSE OFF IN THE MORNING*  40 g  0  . tiotropium (SPIRIVA HANDIHALER) 18 MCG inhalation capsule Place 1 capsule (18 mcg total) into inhaler and inhale daily.  90 capsule  1  . tolterodine (DETROL LA) 2 MG 24 hr capsule TAKE ONE CAPSULE BY MOUTH DAILY  90 capsule  1  . traMADol (ULTRAM) 50 MG tablet TAKE 2 TABLETS BY MOUTH EVERY MORNING AND 2 TABLETS EVERY EVENING AS NEEDED  120 tablet  1  . urea (CARMOL) 40 % CREA Apply nightly to feet      . vitamin D, CHOLECALCIFEROL, 400 UNITS tablet Take two by mouth daily  6 tablet  6  . zolpidem (AMBIEN) 5 MG tablet TAKE 1/2 TO 1 TABLET BY MOUTH AT BEDTIME AS NEEDED  30 tablet  2   No current facility-administered medications on file prior to visit.   The PMH, PSH, Social History, Family History, Medications, and allergies have been reviewed in [facility], and have been updated if relevant.    Review of Systems  No CP  No SOB +nausea, +visual changes (eye exam UTD- two weeks ago)    Objective:    BP 130/84  Pulse 103  Temp(Src) 97.6 F (36.4 C) (Oral)  Wt 211 lb (95.709 kg)  SpO2 94%   Physical Exam  Nursing note and vitals reviewed. Constitutional: She appears well-developed and well-nourished. No distress.  HENT:  Mouth/Throat: Mucous membranes are dry.  Cardiovascular: Normal rate  and regular rhythm.   Pulmonary/Chest: Effort normal and breath sounds normal.  Abdominal: Soft. Bowel sounds are normal.  Skin: Skin is warm and dry.         Assessment & Plan:   Hyperglycemia - Plan: Hemoglobin A1c, Comprehensive metabolic panel, Microalbumin / creatinine urine ratio No Follow-up on file.

## 2013-10-14 NOTE — Patient Instructions (Signed)
I will call you with your lab results. We are: 1.  Increasing your Metformin to 1000 mg twice daily.  2.  Starting Simvastatin (cholesterol) 10 mg nightly at bedtime.

## 2013-10-14 NOTE — Telephone Encounter (Signed)
Pt left v/m requesting cb about lab results from todays visit; pt also wants Dr Deborra Medina to contact [facility] about pt getting her Gwyneth Revels II supplies. Pt request cb today.

## 2013-10-14 NOTE — Progress Notes (Signed)
Pre visit review using our clinic review tool, if applicable. No additional management support is needed unless otherwise documented below in the visit note. 

## 2013-10-14 NOTE — Assessment & Plan Note (Signed)
Now with DM. Start statin- simvastatin 10 mg qhs. Orders Placed This Encounter  Procedures  . Hemoglobin A1c  . Comprehensive metabolic panel  . Microalbumin / creatinine urine ratio  . HM DIABETES EYE EXAM

## 2013-10-15 MED ORDER — GLUCOSE BLOOD VI DISK: DISK | Status: DC

## 2013-10-15 NOTE — Telephone Encounter (Signed)
See result note.  

## 2013-10-15 NOTE — Telephone Encounter (Addendum)
Pt called back; pt concerned about not getting Breese II at [facility]; FBS today 183. Pt said she is watching her diet closely. Pt said if ins does not pay for diabetic supplies pt cannot afford to test blood sugar; Tim at [employer] said without diabetic dx ins will not approve. Thereasa request cb on 10/16/13.

## 2013-10-15 NOTE — Telephone Encounter (Signed)
Please see result note.  Her a1c is 6.5 so she does have diabetes. Dx is 250.00

## 2013-10-15 NOTE — Addendum Note (Signed)
Addended by: Modena Nunnery on: 10/15/2013 10:55 AM   Modules accepted: Orders

## 2013-10-15 NOTE — Telephone Encounter (Signed)
Spoke to pt and informed her of results and instruction

## 2013-10-15 NOTE — Telephone Encounter (Signed)
Pt left v/m; pt still having problems getting test strips or disc from [facility]. Spoke with Tim at [employer] and needs diagnosis code for ins to pay for diabetic supplies. Pt request cb.

## 2013-10-16 ENCOUNTER — Other Ambulatory Visit: Payer: Self-pay | Admitting: Family Medicine

## 2013-10-16 NOTE — Telephone Encounter (Signed)
Pt requesting medication refill. Last ov 09/2013. pls advise

## 2013-10-16 NOTE — Telephone Encounter (Signed)
Rx written and given to [facility].

## 2013-10-16 NOTE — Telephone Encounter (Signed)
Spoke to pharmacist [place], who states that the pt will need a new prescription that includes Dx code, testing frequency and quantity. She states that it is required by the govt for Medicare part B; mostly required for DM and transplant meds

## 2013-10-16 NOTE — Telephone Encounter (Signed)
Lm on pts vm requesting a call back

## 2013-10-17 ENCOUNTER — Telehealth: Payer: Self-pay | Admitting: Family Medicine

## 2013-10-17 NOTE — Telephone Encounter (Signed)
Pt left v/m; pt said for ins to pay needs instructions for diabetic test strips to instruct to ck BS three times a week. Pt said on 10/16/13 FBS was 88 and last night BS was 215. Pt thinks needs to check BS more than 3 times a week and wants Dr Hulen Shouts opinion.pt request cb.

## 2013-10-17 NOTE — Telephone Encounter (Signed)
Spoke to pt and informed her that she should be testing 3x/wk as instructed. Pt states she was informed by pharmacy that it was daily. Informed that a new handwritten script was faxed to them 05/13.

## 2013-10-17 NOTE — Telephone Encounter (Signed)
Spoke to pt and informed her Rx has already been faxed to pharmacy

## 2013-10-17 NOTE — Telephone Encounter (Signed)
Spoke to pt and informed her that I would call pharmacy directly. Spoke to [place] at the pharmacy who states that she informed the pt that she DID receive the Rx for DM supplies, but that the insurance company would not pay, unless she was testing daily; Dr Deborra Medina wanting her to test TID as new onset and she is not on insulin. See previous note

## 2013-10-17 NOTE — Telephone Encounter (Signed)
Pt says she's been discussing diabetic supplies w/you and would like for you to call her back in reference to this. Thank you.

## 2013-10-17 NOTE — Telephone Encounter (Signed)
I wrote the instructions as she should check 3 times weekly and that should be sufficient.  She is not on insulin and FSBS running between 88 and 215 is acceptable.

## 2013-10-17 NOTE — Telephone Encounter (Signed)
Pt request status of diabetic test strips refill; pt is out of strips. Pt request cb.-

## 2013-10-18 ENCOUNTER — Ambulatory Visit (HOSPITAL_BASED_OUTPATIENT_CLINIC_OR_DEPARTMENT_OTHER): Payer: Medicare Other | Attending: Pulmonary Disease

## 2013-10-18 VITALS — Ht 63.0 in | Wt 205.0 lb

## 2013-10-18 DIAGNOSIS — G4733 Obstructive sleep apnea (adult) (pediatric): Secondary | ICD-10-CM

## 2013-10-18 DIAGNOSIS — Z79899 Other long term (current) drug therapy: Secondary | ICD-10-CM | POA: Diagnosis not present

## 2013-10-24 ENCOUNTER — Ambulatory Visit (INDEPENDENT_AMBULATORY_CARE_PROVIDER_SITE_OTHER): Payer: Medicare Other | Admitting: Ophthalmology

## 2013-10-24 DIAGNOSIS — H2700 Aphakia, unspecified eye: Secondary | ICD-10-CM

## 2013-10-25 ENCOUNTER — Telehealth: Payer: Self-pay | Admitting: Pulmonary Disease

## 2013-10-25 DIAGNOSIS — G4733 Obstructive sleep apnea (adult) (pediatric): Secondary | ICD-10-CM

## 2013-10-25 DIAGNOSIS — F312 Bipolar disorder, current episode manic severe with psychotic features: Secondary | ICD-10-CM | POA: Diagnosis not present

## 2013-10-25 NOTE — Telephone Encounter (Signed)
Spoke with the pt to let her know we received her msg, but VS not in the office today and we will have to call her back with results  Please advise, thanks!

## 2013-10-29 ENCOUNTER — Other Ambulatory Visit: Payer: Self-pay | Admitting: Family Medicine

## 2013-10-29 ENCOUNTER — Telehealth: Payer: Self-pay

## 2013-10-29 NOTE — Telephone Encounter (Signed)
Patient Information:  Caller Name: Stephanie [name]  Phone: [phone]  Patient: Stephanie [name]  Gender: Female  DOB: [birth year]  Age: 63 Years  PCP: [name] [facility])  Office Follow Up:  Does the office need to follow up with this patient?: No  Instructions For The Office: N/A  RN Note:  Pt refused ED she states that the ED is not needed because dizziness has been present x 1 week.  Pt request appt in office  Symptoms  Reason For Call & Symptoms: Pt states that she is "sick for 2 weeks:"   She reports that she has recently been diagnosised with DM.  She states that after taking medication she becomes nausaed.  She did have eyes checked because she was seeing things and "eye doctor said my eyes were fine"   Pt was seen in ED.  Pt is requesting an appt with PCP  Pt reports that dizziness has been present around 1 month off and on.  The spells come on and she gets really dizzy and has to lye down.  The room is spinning.  Reviewed Health History In EMR: Yes  Reviewed Medications In EMR: Yes  Reviewed Allergies In EMR: Yes  Reviewed Surgeries / Procedures: Yes  Date of Onset of Symptoms: 10/04/2013  Guideline(s) Used:  Dizziness  Disposition Per Guideline:   Go to ED Now (or to Office with PCP Approval)  Reason For Disposition Reached:   Severe dizziness (e.g., unable to stand, requires support to walk, feels like passing out now)  Advice Given:  N/A  Patient Will Follow Care Advice:  YES  Appointment Scheduled:  10/30/2013 10:15:00 Appointment Scheduled Provider:  [name] [facility])

## 2013-10-29 NOTE — Telephone Encounter (Signed)
Pt requesting medication refill. Last ov 10/2013 with no future appts scheduled. pls advise

## 2013-10-29 NOTE — Telephone Encounter (Signed)
Spoke to pt and informed her Rx has been called in to requested pharmacy

## 2013-10-29 NOTE — Telephone Encounter (Signed)
Amy with [facility] said pt calling [facility] with dizziness and nausea that pt has had for a while; [facility] has ED disposition but pt refuses. No available appts today but Dr Einar Gip to use one of Dr Hulen Shouts same day appts on 10/30/13. Amy will schedule with pt and also will advise pt if condition changes or worsens prior to appt to go to UC or ED for eval.

## 2013-10-30 ENCOUNTER — Ambulatory Visit (INDEPENDENT_AMBULATORY_CARE_PROVIDER_SITE_OTHER): Payer: Medicare Other | Admitting: Family Medicine

## 2013-10-30 ENCOUNTER — Encounter: Payer: Self-pay | Admitting: Family Medicine

## 2013-10-30 ENCOUNTER — Telehealth: Payer: Self-pay

## 2013-10-30 ENCOUNTER — Telehealth: Payer: Self-pay | Admitting: Pulmonary Disease

## 2013-10-30 ENCOUNTER — Encounter: Payer: Self-pay | Admitting: *Deleted

## 2013-10-30 VITALS — BP 142/88 | HR 89 | Temp 97.6°F | Wt 213.0 lb

## 2013-10-30 DIAGNOSIS — R110 Nausea: Secondary | ICD-10-CM

## 2013-10-30 DIAGNOSIS — R9431 Abnormal electrocardiogram [ECG] [EKG]: Secondary | ICD-10-CM | POA: Diagnosis not present

## 2013-10-30 DIAGNOSIS — E119 Type 2 diabetes mellitus without complications: Secondary | ICD-10-CM

## 2013-10-30 DIAGNOSIS — R42 Dizziness and giddiness: Secondary | ICD-10-CM

## 2013-10-30 DIAGNOSIS — E1139 Type 2 diabetes mellitus with other diabetic ophthalmic complication: Secondary | ICD-10-CM

## 2013-10-30 DIAGNOSIS — E11319 Type 2 diabetes mellitus with unspecified diabetic retinopathy without macular edema: Secondary | ICD-10-CM

## 2013-10-30 DIAGNOSIS — E1165 Type 2 diabetes mellitus with hyperglycemia: Secondary | ICD-10-CM

## 2013-10-30 DIAGNOSIS — IMO0002 Reserved for concepts with insufficient information to code with codable children: Secondary | ICD-10-CM

## 2013-10-30 DIAGNOSIS — R0989 Other specified symptoms and signs involving the circulatory and respiratory systems: Secondary | ICD-10-CM

## 2013-10-30 DIAGNOSIS — G4733 Obstructive sleep apnea (adult) (pediatric): Secondary | ICD-10-CM

## 2013-10-30 DIAGNOSIS — R0609 Other forms of dyspnea: Secondary | ICD-10-CM | POA: Insufficient documentation

## 2013-10-30 DIAGNOSIS — Z79899 Other long term (current) drug therapy: Secondary | ICD-10-CM | POA: Diagnosis not present

## 2013-10-30 LAB — COMPREHENSIVE METABOLIC PANEL
ALT: 30 U/L (ref 0–35)
AST: 42 U/L — ABNORMAL HIGH (ref 0–37)
Albumin: 3.5 g/dL (ref 3.5–5.2)
Alkaline Phosphatase: 114 U/L (ref 39–117)
BILIRUBIN TOTAL: 0.5 mg/dL (ref 0.2–1.2)
BUN: 11 mg/dL (ref 6–23)
CHLORIDE: 106 meq/L (ref 96–112)
CO2: 27 mEq/L (ref 19–32)
Calcium: 8.9 mg/dL (ref 8.4–10.5)
Creatinine, Ser: 0.9 mg/dL (ref 0.4–1.2)
GFR: 71.75 mL/min (ref >60.00)
Glucose, Bld: 149 mg/dL — ABNORMAL HIGH (ref 70–99)
Potassium: 4 mEq/L (ref 3.5–5.1)
Sodium: 140 mEq/L (ref 135–145)
TOTAL PROTEIN: 6.8 g/dL (ref 6.0–8.3)

## 2013-10-30 LAB — CBC WITH DIFFERENTIAL/PLATELET
BASOS ABS: 0 10*3/uL (ref 0.0–0.1)
Basophils Relative: 0.6 % (ref 0.0–3.0)
EOS ABS: 0.1 10*3/uL (ref 0.0–0.7)
Eosinophils Relative: 1.6 % (ref 0.0–5.0)
HCT: 37.5 % (ref 36.0–46.0)
HEMOGLOBIN: 12.3 g/dL (ref 12.0–15.0)
LYMPHS ABS: 1.1 10*3/uL (ref 0.7–4.0)
LYMPHS PCT: 17.6 % (ref 12.0–46.0)
MCHC: 32.7 g/dL (ref 30.0–36.0)
MCV: 91.1 fl (ref 78.0–100.0)
Monocytes Absolute: 0.4 10*3/uL (ref 0.1–1.0)
Monocytes Relative: 6.9 % (ref 3.0–12.0)
NEUTROS ABS: 4.7 10*3/uL (ref 1.4–7.7)
Neutrophils Relative %: 73.3 % (ref 43.0–77.0)
PLATELETS: 160 10*3/uL (ref 150.0–400.0)
RBC: 4.11 Mil/uL (ref 3.87–5.11)
RDW: 14.8 % (ref 11.5–15.5)
WBC: 6.4 10*3/uL (ref 4.0–10.5)

## 2013-10-30 LAB — LIPASE: Lipase: 5 U/L — ABNORMAL LOW (ref 11.0–59.0)

## 2013-10-30 LAB — POCT CBG MONITORING

## 2013-10-30 LAB — TSH: TSH: 3.64 u[IU]/mL (ref 0.35–4.50)

## 2013-10-30 LAB — BRAIN NATRIURETIC PEPTIDE: Pro B Natriuretic peptide (BNP): 56 pg/mL (ref 0.0–100.0)

## 2013-10-30 MED ORDER — OMEPRAZOLE 40 MG PO CPDR: DELAYED_RELEASE_CAPSULE | ORAL | Status: DC

## 2013-10-30 MED ORDER — SIMVASTATIN 10 MG PO TABS: 10.0000 mg | ORAL_TABLET | Freq: Every day | ORAL | Status: DC

## 2013-10-30 MED ORDER — SIMVASTATIN 20 MG PO TABS: 20.0000 mg | ORAL_TABLET | Freq: Every day | ORAL | Status: DC

## 2013-10-30 MED ORDER — METFORMIN HCL 1000 MG PO TABS: 500.0000 mg | ORAL_TABLET | Freq: Two times a day (BID) | ORAL | Status: DC

## 2013-10-30 NOTE — Assessment & Plan Note (Signed)
Chronic issue now with worsening GERd symptoms.  Increase omeprazole to twice daily for next two weeks, advised to call Dr. Deatra Ina. Will check labs, including lipase today. If symptoms worsen, she knows to go to ER.

## 2013-10-30 NOTE — Progress Notes (Signed)
Pre visit review using our clinic review tool, if applicable. No additional management support is needed unless otherwise documented below in the visit note. 

## 2013-10-30 NOTE — Progress Notes (Signed)
Subjective:   Patient ID: Stephanie [name], female    DOB: July 11, 1950, 63 y.o.   MRN: [id number]  Stephanie [name] is a pleasant 63 y.o. year old female who presents to clinic today for dizziness. HPI: Recently diagnosed with DM earlier this month.  On Metformin 1000 mg twice daily.  Started simvastatin 10 mg daily as well given her recent diagnosis. Lab Results  Component Value Date   HGBA1C 6.5 10/14/2013   Lab Results  Component Value Date   CHOL 251* 07/22/2013   HDL 96.50 07/22/2013   LDLDIRECT 143.4 07/22/2013   TRIG 135.0 07/22/2013   CHOLHDL 3 07/22/2013   She is complaining of one month of dizziness and nausea.  Has been taking phenergan regularly for years.  Also increased lose stools since we increased the Metformin.  On several medications which could cause nausea and dizziness (through pain clinic and psychiatry) but per pt, none changed recently.  Can feel acid coming up but cannot vomit.  Has not checked blood sugar.  Not having any CP or blurred vision.  She is having some left back pain with some DOE and orthopnea.  She feels this is worsening. Wt Readings from Last 3 Encounters:  10/30/13 213 lb (96.616 kg)  10/18/13 205 lb (92.987 kg)  10/14/13 211 lb (95.709 kg)   Does have h/o GERD. Sees Dr. Deatra Ina- GI- has not seen him in months. Taking omeprazole 40 mg daily. Patient Active Problem List   Diagnosis Date Noted  . Dizziness and giddiness 10/30/2013  . Diabetes mellitus type 2, controlled, with complications 93/79/0240  . Medicare annual wellness visit, subsequent 07/29/2013  . DNR (do not resuscitate) 07/29/2013  . Candidal intertrigo 07/29/2013  . Encounter for routine gynecological examination 07/29/2013  . Foul smelling urine 07/29/2013  . Dyspnea 07/10/2013  . Lung nodule 07/10/2013  . Right foot pain 05/20/2013  . Varicose veins of lower extremities with other complications 97/35/3299  . GERD (gastroesophageal reflux disease) 10/09/2012  .  Cirrhosis of liver 02/02/2012  . Anxiety 07/11/2011  . Aortic dissection   . BACK PAIN, LUMBAR, CHRONIC 11/19/2009  . UNS ADVRS EFF OTH RX MEDICINAL&BIOLOGICAL SBSTNC 12/16/2008  . UNSPECIFIED VITAMIN D DEFICIENCY 09/26/2008  . TOBACCO ABUSE 06/19/2008  . RESTLESS LEGS SYNDROME 04/27/2007  . INSOMNIA 04/27/2007  . Benign Neoplasm of Colon 04/04/2007  . HYPERLIPIDEMIA 03/08/2007  . OBSTRUCTIVE SLEEP APNEA 03/08/2007  . HYPERTENSION 03/08/2007  . COPD 03/08/2007  . BIPOLAR AFFECTIVE DISORDER, HX OF 03/08/2007   Past Medical History  Diagnosis Date  . Adenomatous colon polyp   . Insomnia   . RLS (restless legs syndrome)   . Colon polyp   . Incontinence   . Hepatitis B, chronic   . H/O: rheumatic fever   . Hyperlipidemia   . Unspecified disorders of nervous system   . Bipolar affective disorder     h/o  . H/O: CVA (cardiovascular accident)     TIA  . Fatty liver 04/09/08    found in abd CT  . Shortness of breath   . Obstructive sleep apnea     not using CPAP, last sleep study 2.5 years ago, per pt doctor is aware  . Blood transfusion 2000  . GERD (gastroesophageal reflux disease)   . Arthritis   . Aortic dissection     Type 1  . COPD (chronic obstructive pulmonary disease)   . Anxiety   . Depression   . Heart murmur     as  a child  . Stroke     TIA- 2002  . Ulcer    Past Surgical History  Procedure Laterality Date  . Back surgery      x 45  . Cholecystectomy    . Rotator cuff repair      left  . Abdominal hysterectomy      total  . Lumbar fusion  10/09  . Hemorroidectomy      and colon polyp removed  . Abdominal exploration surgery    . Posterior cervical fusion/foraminotomy    . Eye surgery      bilat  . Lumbar wound debridement  05/09/2011    Procedure: LUMBAR WOUND DEBRIDEMENT;  Surgeon: Dahlia Bailiff;  Location: [place];  Service: Orthopedics;  Laterality: N/A;  IRRIGATION AND DEBRIDEMENT SPINAL WOUND  . Axillary artery cannulation via 8-mm hemashield  graft, median sternotomy, extracorporeal circulation with deep hypothermic circulatory arrest, repair of  aortic dessection  01/23/2009    Dr Arlyce Dice  . Cataract      both eyes  . Colonoscopy    . Liver biopsy  04/10/2012    Procedure: LIVER BIOPSY;  Surgeon: Inda Castle, MD;  Location: [facility];  Service: Endoscopy;  Laterality: N/A;  ultrasound to mark order for abd limited/liver to be marked to be sent by [email or web address]    History  Substance Use Topics  . Smoking status: Former Smoker -- 0.50 packs/day for 50 years    Types: Cigarettes    Quit date: 04/06/2013  . Smokeless tobacco: Never Used  . Alcohol Use: No   Family History  Problem Relation Age of Onset  . Colon cancer Neg Hx   . Esophageal cancer Neg Hx   . Rectal cancer Neg Hx   . Breast cancer Mother   . Lung cancer Mother   . Stomach cancer Father   . Diabetes      4 aunts, and 1 uncle   No Known Allergies Current Outpatient Prescriptions on File Prior to Visit  Medication Sig Dispense Refill  . ABILIFY 30 MG tablet TAKE 1 TABLET BY MOUTH EVERY MORNING  30 tablet  0  . albuterol (PROAIR HFA) 108 (90 BASE) MCG/ACT inhaler INHALE 2 PUFFS INTO THE LUNGS EVERY 6 (SIX) HOURS AS NEEDED FOR WHEEZING.  8.5 each  3  . ALORA 0.1 MG/24HR patch PLACE 1 PATCH (0.1 MG TOTAL) ONTO THE SKIN 2 (TWO) TIMES A WEEK.  8 patch  3  . clonazePAM (KLONOPIN) 0.5 MG tablet TAKE 1 TABLET BY MOUTH 2 TIMES A DAY AS NEEDED FOR ANXIETY  60 tablet  0  . [facility] STOOL SOFTENER 100 MG capsule TAKE 1 CAPSULE BY MOUTH 2 TIMES DAILY AS NEEDED FOR CONSTIPATION  60 capsule  3  . diazepam (VALIUM) 5 MG tablet TAKE 1 TABLET BY MOUTH 2 TIMES A DAY AS NEEDED FOR MUSCLE SPASMS  60 tablet  0  . lamoTRIgine (LAMICTAL) 100 MG tablet Take 3 tablets by mouth once daily      . lidocaine (LIDODERM) 5 % PLACE 1 PATCH ONTO THE SKIN DAILY. REMOVE & DISCARD PATCH WITHIN 12 HOURS OR AS DIRECTED BY MD  30 patch  0  . metFORMIN (GLUCOPHAGE) 1000 MG tablet Take 1 tablet  (1,000 mg total) by mouth 2 (two) times daily with a meal.  180 tablet  3  . MICROLET LANCETS MISC Use to test blood sugar once daily  100 each  0  . mirtazapine (REMERON) 15 MG tablet TAKE 1 TABLET  BY MOUTH AT BEDTIME  34 tablet  2  . nystatin (MYCOSTATIN/NYSTOP) 100000 UNIT/GM POWD APPLY TOPICALLY 2 TIMES DAILY AS DIRECTED  30 g  0  . nystatin-triamcinolone ointment (MYCOLOG) Apply 1 application topically 2 (two) times daily.  30 g  0  . omeprazole (PRILOSEC) 40 MG capsule TAKE 1 CAPSULE BY MOUTH DAILY.  90 capsule  1  . promethazine (PHENERGAN) 25 MG tablet TAKE 1/2 TO 1 TABLET BY MOUTH EVERY 8 HOURS AS NEEDED FOR NAUSEA  30 tablet  0  . QUEtiapine (SEROQUEL) 50 MG tablet TAKE 3 TABLETS BY MOUTH AT BEDTIME  90 tablet  2  . rOPINIRole (REQUIP) 3 MG tablet TAKE 1 TABLET BY MOUTH AT BEDTIME  90 tablet  0  . salicylic acid 6 % gel APPLY TO AFFECTED AREA EVERY DAY *USE AT NIGHT AND RINSE OFF IN THE MORNING*  40 g  0  . tiotropium (SPIRIVA HANDIHALER) 18 MCG inhalation capsule Place 1 capsule (18 mcg total) into inhaler and inhale daily.  90 capsule  1  . tolterodine (DETROL LA) 2 MG 24 hr capsule TAKE ONE CAPSULE BY MOUTH DAILY  90 capsule  1  . traMADol (ULTRAM) 50 MG tablet TAKE 2 TABLETS BY MOUTH EVERY MORNING AS NEEDED AND 2 TABLETS EVERY EVENING AS NEEDED  120 tablet  0  . urea (CARMOL) 40 % CREA Apply nightly to feet      . vitamin D, CHOLECALCIFEROL, 400 UNITS tablet Take two by mouth daily  6 tablet  6  . zolpidem (AMBIEN) 5 MG tablet TAKE 1/2 TO 1 TABLET BY MOUTH AT BEDTIME AS NEEDED  30 tablet  2  . Blood Glucose Monitoring Suppl (BREEZE 2 BLOOD GLUCOSE SYSTEM) DEVI Use to test blood sugar once daily  1 Device  0  . Glucose Blood (BAYER BREEZE 2 TEST) DISK Use to test blood sugar once daily  30 each  1   No current facility-administered medications on file prior to visit.   The PMH, PSH, Social History, Family History, Medications, and allergies have been reviewed in [facility], and have been  updated if relevant.    Review of Systems  See HPI No blood in stool No black stools      Objective:    BP 142/88  Pulse 89  Temp(Src) 97.6 F (36.4 C) (Oral)  Wt 213 lb (96.616 kg)  SpO2 98%   Physical Exam  Nursing note and vitals reviewed. Constitutional: She appears well-developed and well-nourished. No distress.  HENT:  Mouth/Throat: Mucous membranes are dry.  Cardiovascular: Normal rate and regular rhythm.   Pulmonary/Chest: Effort normal and breath sounds normal.  Abdominal: Soft. Bowel sounds are normal.  Skin: Skin is warm and dry.   Ext:  No edema Assessment & Plan:   Dizziness - Plan: EKG 12-Lead  Uncontrolled diabetes mellitus with retinopathy  Dizziness and giddiness No Follow-up on file.

## 2013-10-30 NOTE — Telephone Encounter (Signed)
Pt is aware of results. Order will be placed for CPAP to [facility] in [place] per the pt's request.

## 2013-10-30 NOTE — Assessment & Plan Note (Signed)
See above

## 2013-10-30 NOTE — Telephone Encounter (Signed)
Pt has appt with Dr Fletcher Anon on 10/31/13 and pt wanted to know if she needed to take a copy of her med list; advised pt that Dr Fletcher Anon will be able to see med list in pt's chart; pt said she was advised to bring her meds; I told pt she should take her meds with her. Pt voiced understanding.

## 2013-10-30 NOTE — Telephone Encounter (Signed)
Pt had sleep study done 5.15.15 Called spoke with patient and informed her that sleep studies typically take 2weeks+ for results.  Pt okay with this and verbalized her understanding.  Will forward to VS to make him aware pt was asking about these results.

## 2013-10-30 NOTE — Telephone Encounter (Signed)
Pt is aware of sleep study results. PLEASE SEE OTHER TELEPHONE ENCOUNTER!

## 2013-10-30 NOTE — Telephone Encounter (Signed)
Please advise of results. Thanks.

## 2013-10-30 NOTE — Addendum Note (Signed)
Addended by: Modena Nunnery on: 10/30/2013 11:20 AM   Modules accepted: Orders

## 2013-10-30 NOTE — Sleep Study (Signed)
[place]  NAME: Stephanie [name] DATE OF BIRTH:  1950/12/20 MEDICAL RECORD NUMBER [id number]  LOCATION: [facility]  PHYSICIAN: Chesley Mires, M.D. DATE OF STUDY: 10/18/2013  SLEEP STUDY TYPE: Polysomnogram               REFERRING PHYSICIAN: Chesley Mires, MD  INDICATION FOR STUDY:  Stephanie [name] is a 63 y.o. female who presents to the sleep lab for evaluation of hypersomnia with obstructive sleep apnea.  She reports snoring, sleep disruption, apnea, and daytime sleepiness.  She has prior history of sleep apnea with an AHI of 33.3 and SaO2 low of 85% from 11/30/06.  She required new sleep study to renew CPAP supplies.  EPWORTH SLEEPINESS SCORE: 1. HEIGHT: 5' 3"  (160 cm)  WEIGHT: 205 lb (92.987 kg)    Body mass index is 36.32 kg/(m^2).  NECK SIZE: 16 in.  MEDICATIONS:  Current Outpatient Prescriptions on File Prior to Visit  Medication Sig Dispense Refill  . ABILIFY 30 MG tablet TAKE 1 TABLET BY MOUTH EVERY MORNING  30 tablet  0  . albuterol (PROAIR HFA) 108 (90 BASE) MCG/ACT inhaler INHALE 2 PUFFS INTO THE LUNGS EVERY 6 (SIX) HOURS AS NEEDED FOR WHEEZING.  8.5 each  3  . ALORA 0.1 MG/24HR patch PLACE 1 PATCH (0.1 MG TOTAL) ONTO THE SKIN 2 (TWO) TIMES A WEEK.  8 patch  3  . clonazePAM (KLONOPIN) 0.5 MG tablet TAKE 1 TABLET BY MOUTH 2 TIMES A DAY AS NEEDED FOR ANXIETY  60 tablet  0  . [facility] STOOL SOFTENER 100 MG capsule TAKE 1 CAPSULE BY MOUTH 2 TIMES DAILY AS NEEDED FOR CONSTIPATION  60 capsule  3  . diazepam (VALIUM) 5 MG tablet TAKE 1 TABLET BY MOUTH 2 TIMES A DAY AS NEEDED FOR MUSCLE SPASMS  60 tablet  0  . lamoTRIgine (LAMICTAL) 100 MG tablet Take 3 tablets by mouth once daily      . lidocaine (LIDODERM) 5 % PLACE 1 PATCH ONTO THE SKIN DAILY. REMOVE & DISCARD PATCH WITHIN 12 HOURS OR AS DIRECTED BY MD  30 patch  0  . MICROLET LANCETS MISC Use to test blood sugar once daily  100 each  0  . mirtazapine (REMERON) 15 MG tablet TAKE 1 TABLET BY  MOUTH AT BEDTIME  34 tablet  2  . nystatin (MYCOSTATIN/NYSTOP) 100000 UNIT/GM POWD APPLY TOPICALLY 2 TIMES DAILY AS DIRECTED  30 g  0  . nystatin-triamcinolone ointment (MYCOLOG) Apply 1 application topically 2 (two) times daily.  30 g  0  . QUEtiapine (SEROQUEL) 50 MG tablet TAKE 3 TABLETS BY MOUTH AT BEDTIME  90 tablet  2  . rOPINIRole (REQUIP) 3 MG tablet TAKE 1 TABLET BY MOUTH AT BEDTIME  90 tablet  0  . salicylic acid 6 % gel APPLY TO AFFECTED AREA EVERY DAY *USE AT NIGHT AND RINSE OFF IN THE MORNING*  40 g  0  . tiotropium (SPIRIVA HANDIHALER) 18 MCG inhalation capsule Place 1 capsule (18 mcg total) into inhaler and inhale daily.  90 capsule  1  . tolterodine (DETROL LA) 2 MG 24 hr capsule TAKE ONE CAPSULE BY MOUTH DAILY  90 capsule  1  . urea (CARMOL) 40 % CREA Apply nightly to feet      . vitamin D, CHOLECALCIFEROL, 400 UNITS tablet Take two by mouth daily  6 tablet  6  . zolpidem (AMBIEN) 5 MG tablet TAKE 1/2 TO 1 TABLET BY MOUTH  AT BEDTIME AS NEEDED  30 tablet  2   No current facility-administered medications on file prior to visit.    SLEEP ARCHITECTURE:  Total recording time: 382 minutes.  Total sleep time was: 322.5 minutes.  Sleep efficiency: 84.4%.  Sleep latency: 8.5 minutes.  REM latency: 211.5 minutes.  Stage N1: 9.8%.  Stage N2: 76.6%.  Stage N3: 0%.  Stage R:  13.6%.  Supine sleep: 0 minutes.  Non-supine sleep: 322.5 minutes.  CARDIAC DATA:  Average heart rate: 84 beats per minute. Rhythm strip: sinus rhythm.  RESPIRATORY DATA: Average respiratory rate: 15. Snoring: loud. Average AHI: 6.9.   Apnea index: 0.9.  Hypopnea index: 3.3. Obstructive apnea index: 0.  Central apnea index: 0.9.  Mixed apnea index: 0. REM AHI: 8.2.  NREM AHI: 3.7. Supine AHI: N/A. Non-supine AHI: 6.9.  MOVEMENT/PARASOMNIA:  Periodic limb movement: 0.  Period limb movements with arousals: 0. Restroom trips: one.  OXYGEN DATA:  Baseline oxygenation: 96%. Lowest SaO2: 82%. Time spent  below SaO2 90%: 139.2 minutes. Supplemental oxygen used: none.  IMPRESSION/ RECOMMENDATION:   This study shows mild obstructive sleep apnea with an AHI of 6.9 and SaO2 low of 82%.  She had significant oxygen desaturation in the absence of other respiratory events.  This is suggestive of sleep related hypoventilation.  She she undergo weight loss regimen and start CPAP.  She should have overnight oximetry done with CPAP to determine if she needs to use supplemental oxygen in addition to CPAP.   Chesley Mires, M.D. Diplomate, [employer] of [employer] Medicine  ELECTRONICALLY SIGNED ON:  10/30/2013, 3:53 PM Rafael Capo PH: [phone]   FX: [phone] [place]

## 2013-10-30 NOTE — Telephone Encounter (Signed)
Please see follow up to phone note from 10/25/13.  Sleep study results are posted in this note, along with patient instructions.

## 2013-10-30 NOTE — Telephone Encounter (Signed)
PSG 10/18/13 >> AHI 6.9, SaO2 low 82%.  Spent 139.2 min with SaO2 < 89%.  Will have nurse inform pt that sleep study confirms dx of sleep apnea.  Please send order to have pt started on CPAP 8 cm H2O with heated humidity and mask of choice.  Please arrange for overnight oximetry with patient using CPAP and room air.  Please arrange for ROV two months after CPAP set up.

## 2013-10-30 NOTE — Assessment & Plan Note (Signed)
?   Due to increased metformin. Will decrease dose to 500 mg twice daily and check labs today. Also advised her NOT to take it when she is not eating or drinking much. FSBS reassuring today 152.  EKG abnormal but likely not a good read- CMA repeated this 3 times.  Given worsening DOE, will send her to cardiology for evaluation. The patient indicates understanding of these issues and agrees with the plan.

## 2013-10-30 NOTE — Patient Instructions (Signed)
Please decrease your metformin to 500 mg twice daily. We are increasing your nexium to 40 mg twice daily x 2 weeks.  Stop by to see Rosaria Ferries after you to the lab.

## 2013-10-31 ENCOUNTER — Ambulatory Visit (INDEPENDENT_AMBULATORY_CARE_PROVIDER_SITE_OTHER): Payer: Medicare Other | Admitting: Cardiovascular Disease

## 2013-10-31 ENCOUNTER — Encounter: Payer: Self-pay | Admitting: Cardiovascular Disease

## 2013-10-31 ENCOUNTER — Telehealth: Payer: Self-pay | Admitting: Pulmonary Disease

## 2013-10-31 ENCOUNTER — Telehealth: Payer: Self-pay

## 2013-10-31 VITALS — BP 128/72 | HR 95 | Ht 63.0 in | Wt 212.8 lb

## 2013-10-31 DIAGNOSIS — R42 Dizziness and giddiness: Secondary | ICD-10-CM | POA: Diagnosis not present

## 2013-10-31 DIAGNOSIS — R0602 Shortness of breath: Secondary | ICD-10-CM

## 2013-10-31 DIAGNOSIS — R0609 Other forms of dyspnea: Secondary | ICD-10-CM

## 2013-10-31 DIAGNOSIS — R0989 Other specified symptoms and signs involving the circulatory and respiratory systems: Secondary | ICD-10-CM

## 2013-10-31 DIAGNOSIS — I10 Essential (primary) hypertension: Secondary | ICD-10-CM

## 2013-10-31 NOTE — Telephone Encounter (Signed)
Pt left v/m requesting cb for lab results when available.

## 2013-10-31 NOTE — Assessment & Plan Note (Signed)
This does not seem to be cardiac in origin. She is not orthostatic today. There is no evidence of arrhythmia. There might be a component of vertigo.

## 2013-10-31 NOTE — Assessment & Plan Note (Signed)
The patient complains of worsening exertional dyspnea without chest discomfort. She has multiple risk factors for coronary artery disease. Baseline ECG does not show significant ischemic changes. I recommend evaluation with a pharmacologic nuclear stress test. She is not able to exercise on a treadmill. I also recommend an echocardiogram to evaluate diastolic function and pulmonary pressure.

## 2013-10-31 NOTE — Progress Notes (Signed)
Primary care physician: Dr. Deborra Medina.   HPI  This is a 63 year old female who was referred for evaluation of exertional dyspnea and abnormal EKG. She has no previous cardiac history. She has chronic medical conditions that include diabetes, hyperlipidemia and obesity. She is not a smoker. Over the last few weeks, she has experienced extensive weakness, nausea, dizziness and diarrhea. This has been associated with significant exertional dyspnea without chest discomfort. No orthopnea or PND. She had blood work done yesterday. These were reviewed and overall were unremarkable including normal BNP. She reports having rheumatic fever as indicated with a heart murmur. There is no history of ischemic heart disease and no previous ischemic cardiac workup. She had previous cholecystectomy. Some of her symptoms were thought to be due to increasing the dose of metformin which was cut down to original dose yesterday.  No Known Allergies   Current Outpatient Prescriptions on File Prior to Visit  Medication Sig Dispense Refill  . ABILIFY 30 MG tablet TAKE 1 TABLET BY MOUTH EVERY MORNING  30 tablet  0  . albuterol (PROAIR HFA) 108 (90 BASE) MCG/ACT inhaler INHALE 2 PUFFS INTO THE LUNGS EVERY 6 (SIX) HOURS AS NEEDED FOR WHEEZING.  8.5 each  3  . ALORA 0.1 MG/24HR patch PLACE 1 PATCH (0.1 MG TOTAL) ONTO THE SKIN 2 (TWO) TIMES A WEEK.  8 patch  3  . clonazePAM (KLONOPIN) 0.5 MG tablet TAKE 1 TABLET BY MOUTH 2 TIMES A DAY AS NEEDED FOR ANXIETY  60 tablet  0  . [employer]S STOOL SOFTENER 100 MG capsule TAKE 1 CAPSULE BY MOUTH 2 TIMES DAILY AS NEEDED FOR CONSTIPATION  60 capsule  3  . diazepam (VALIUM) 5 MG tablet TAKE 1 TABLET BY MOUTH 2 TIMES A DAY AS NEEDED FOR MUSCLE SPASMS  60 tablet  0  . lamoTRIgine (LAMICTAL) 100 MG tablet Take 3 tablets by mouth once daily      . lidocaine (LIDODERM) 5 % PLACE 1 PATCH ONTO THE SKIN DAILY. REMOVE & DISCARD PATCH WITHIN 12 HOURS OR AS DIRECTED BY MD  30 patch  0  . metFORMIN (GLUCOPHAGE)  1000 MG tablet Take 0.5 tablets (500 mg total) by mouth 2 (two) times daily with a meal.  180 tablet  3  . MICROLET LANCETS MISC Use to test blood sugar once daily  100 each  0  . mirtazapine (REMERON) 15 MG tablet TAKE 1 TABLET BY MOUTH AT BEDTIME  34 tablet  2  . nystatin (MYCOSTATIN/NYSTOP) 100000 UNIT/GM POWD APPLY TOPICALLY 2 TIMES DAILY AS DIRECTED  30 g  0  . nystatin-triamcinolone ointment (MYCOLOG) Apply 1 application topically 2 (two) times daily.  30 g  0  . omeprazole (PRILOSEC) 40 MG capsule Take 1 tablet twice daily for 2 weeks, then continue 1 tablet daily.  90 capsule  1  . promethazine (PHENERGAN) 25 MG tablet TAKE 1/2 TO 1 TABLET BY MOUTH EVERY 8 HOURS AS NEEDED FOR NAUSEA  30 tablet  0  . QUEtiapine (SEROQUEL) 50 MG tablet TAKE 3 TABLETS BY MOUTH AT BEDTIME  90 tablet  2  . rOPINIRole (REQUIP) 3 MG tablet TAKE 1 TABLET BY MOUTH AT BEDTIME  90 tablet  0  . salicylic acid 6 % gel APPLY TO AFFECTED AREA EVERY DAY *USE AT NIGHT AND RINSE OFF IN THE MORNING*  40 g  0  . simvastatin (ZOCOR) 10 MG tablet Take 1 tablet (10 mg total) by mouth at bedtime.  90 tablet  3  .  tiotropium (SPIRIVA HANDIHALER) 18 MCG inhalation capsule Place 1 capsule (18 mcg total) into inhaler and inhale daily.  90 capsule  1  . tolterodine (DETROL LA) 2 MG 24 hr capsule TAKE ONE CAPSULE BY MOUTH DAILY  90 capsule  1  . traMADol (ULTRAM) 50 MG tablet TAKE 2 TABLETS BY MOUTH EVERY MORNING AS NEEDED AND 2 TABLETS EVERY EVENING AS NEEDED  120 tablet  0  . urea (CARMOL) 40 % CREA Apply nightly to feet      . vitamin D, CHOLECALCIFEROL, 400 UNITS tablet Take two by mouth daily  6 tablet  6  . zolpidem (AMBIEN) 5 MG tablet TAKE 1/2 TO 1 TABLET BY MOUTH AT BEDTIME AS NEEDED  30 tablet  2   No current facility-administered medications on file prior to visit.     Past Medical History  Diagnosis Date  . Adenomatous colon polyp   . Insomnia   . RLS (restless legs syndrome)   . Colon polyp   . Incontinence   .  Hepatitis B, chronic   . H/O: rheumatic fever   . Hyperlipidemia   . Unspecified disorders of nervous system   . Bipolar affective disorder     h/o  . H/O: CVA (cardiovascular accident)     TIA  . Fatty liver 04/09/08    found in abd CT  . Shortness of breath   . Obstructive sleep apnea     not using CPAP, last sleep study 2.5 years ago, per pt doctor is aware  . Blood transfusion 2000  . GERD (gastroesophageal reflux disease)   . Arthritis   . Aortic dissection     Type 1  . COPD (chronic obstructive pulmonary disease)   . Anxiety   . Depression   . Heart murmur     as a child  . Stroke     TIA- 2002  . Ulcer   . Diabetes mellitus without complication      Past Surgical History  Procedure Laterality Date  . Back surgery      x 45  . Cholecystectomy    . Rotator cuff repair      left  . Abdominal hysterectomy      total  . Lumbar fusion  10/09  . Hemorroidectomy      and colon polyp removed  . Abdominal exploration surgery    . Posterior cervical fusion/foraminotomy    . Eye surgery      bilat  . Lumbar wound debridement  05/09/2011    Procedure: LUMBAR WOUND DEBRIDEMENT;  Surgeon: Dahlia Bailiff;  Location: [facility];  Service: Orthopedics;  Laterality: N/A;  IRRIGATION AND DEBRIDEMENT SPINAL WOUND  . Axillary artery cannulation via 8-mm hemashield graft, median sternotomy, extracorporeal circulation with deep hypothermic circulatory arrest, repair of  aortic dessection  01/23/2009    Dr Arlyce Dice  . Cataract      both eyes  . Colonoscopy    . Liver biopsy  04/10/2012    Procedure: LIVER BIOPSY;  Surgeon: Inda Castle, MD;  Location: [facility];  Service: Endoscopy;  Laterality: N/A;  ultrasound to mark order for abd limited/liver to be marked to be sent by [email or web address]      Family History  Problem Relation Age of Onset  . Colon cancer Neg Hx   . Esophageal cancer Neg Hx   . Rectal cancer Neg Hx   . Breast cancer Mother   . Lung cancer Mother   . Stomach  cancer Father   .  Diabetes      4 aunts, and 1 uncle     History   Social History  . Marital Status: Divorced    Spouse Name: N/A    Number of Children: 1  . Years of Education: N/A   Occupational History  . Disabled    Social History Main Topics  . Smoking status: Former Smoker -- 0.50 packs/day for 50 years    Types: Cigarettes    Quit date: 04/06/2013  . Smokeless tobacco: Never Used  . Alcohol Use: No  . Drug Use: No  . Sexual Activity: Not on file   Other Topics Concern  . Not on file   Social History Narrative   1 son, 44+ y/o   Daily caffeine use: 2 cups daily   Does not get regular exercise   Disability due to bipolar      Has a living will- she is a DNR (she has form at home per pt).     ROS A 10 point review of system was performed. It is negative other than that mentioned in the history of present illness.   PHYSICAL EXAM   BP 128/72  Pulse 95  Ht 5' 3"  (1.6 m)  Wt 212 lb 12 oz (96.503 kg)  BMI 37.70 kg/m2 Constitutional: She is oriented to person, place, and time. She appears well-developed and well-nourished. No distress.  HENT: No nasal discharge.  Head: Normocephalic and atraumatic.  Eyes: Pupils are equal and round. No discharge.  Neck: Normal range of motion. Neck supple. No JVD present. No thyromegaly present.  Cardiovascular: Normal rate, regular rhythm, normal heart sounds. Exam reveals no gallop and no friction rub. No murmur heard.  Pulmonary/Chest: Effort normal and breath sounds normal. No stridor. No respiratory distress. She has no wheezes. She has no rales. She exhibits no tenderness.  Abdominal: Soft. Bowel sounds are normal. She exhibits no distension. There is no tenderness. There is no rebound and no guarding.  Musculoskeletal: Normal range of motion. She exhibits no edema and no tenderness.  Neurological: She is alert and oriented to person, place, and time. Coordination normal.  Skin: Skin is warm and dry. No rash noted.  She is not diaphoretic. No erythema. No pallor.  Psychiatric: She has a normal mood and affect. Her behavior is normal. Judgment and thought content normal.     DVV:OHYWV  Rhythm  Low voltage -possible pulmonary disease.   ABNORMAL    ASSESSMENT AND PLAN

## 2013-10-31 NOTE — Telephone Encounter (Signed)
I spoke with Stephanie [name] and he states he received an order for CPAP and ONO on cpap. He states she has not bee set up on Cpap yet so he wanted to know what to do with the ONO order. I advised him to do the ONO once the pt is set-up on CPAP. They are working on getting her set-up soon. Savage Bing, CMA

## 2013-10-31 NOTE — Patient Instructions (Addendum)
Your physician has requested that you have an echocardiogram. Echocardiography is a painless test that uses sound waves to create images of your heart. It provides your doctor with information about the size and shape of your heart and how well your heart's chambers and valves are working. This procedure takes approximately one hour. There are no restrictions for this procedure.  [place]  Your caregiver has ordered a Stress Test with nuclear imaging. The purpose of this test is to evaluate the blood supply to your heart muscle. This procedure is referred to as a "Non-Invasive Stress Test." This is because other than having an IV started in your vein, nothing is inserted or "invades" your body. Cardiac stress tests are done to find areas of poor blood flow to the heart by determining the extent of coronary artery disease (CAD). Some patients exercise on a treadmill, which naturally increases the blood flow to your heart, while others who are  unable to walk on a treadmill due to physical limitations have a pharmacologic/chemical stress agent called Lexiscan . This medicine will mimic walking on a treadmill by temporarily increasing your coronary blood flow.   Please note: these test may take anywhere between 2-4 hours to complete  PLEASE REPORT TO [facility] AT THE FIRST DESK WILL DIRECT YOU WHERE TO GO  Date of Procedure:_____________06/05/15________________________  Arrival Time for Procedure:___________0745 am___________________  Instructions regarding medication:   _x___ : Hold diabetes medication morning of procedure   PLEASE NOTIFY THE OFFICE AT LEAST 24 HOURS IN ADVANCE IF YOU ARE UNABLE TO KEEP YOUR APPOINTMENT.  [phone] AND  PLEASE NOTIFY NUCLEAR MEDICINE AT [facility] AT LEAST 24 HOURS IN ADVANCE IF YOU ARE UNABLE TO KEEP YOUR APPOINTMENT. [phone]  How to prepare for your Myoview test:  1. Do not eat or drink after midnight 2. No caffeine  for 24 hours prior to test 3. No smoking 24 hours prior to test. 4. Your medication may be taken with water.  If your doctor stopped a medication because of this test, do not take that medication. 5. Ladies, please do not wear dresses.  Skirts or pants are appropriate. Please wear a short sleeve shirt. 6. No perfume, cologne or lotion. 7. Wear comfortable walking shoes. No heels!        Your physician recommends that you schedule a follow-up appointment in:  As needed

## 2013-11-01 ENCOUNTER — Telehealth: Payer: Self-pay

## 2013-11-01 NOTE — Telephone Encounter (Signed)
Informed patient that EKG was normal  Patient verbalized understanding

## 2013-11-01 NOTE — Telephone Encounter (Signed)
Yes. EKG was normal.

## 2013-11-01 NOTE — Telephone Encounter (Signed)
Pt called and wants to know if her EKG yesterday was normal. Please call and advise.

## 2013-11-04 ENCOUNTER — Telehealth: Payer: Self-pay | Admitting: Pulmonary Disease

## 2013-11-04 NOTE — Telephone Encounter (Signed)
ATCx1 home # busy LMOM x 1 cell number

## 2013-11-05 ENCOUNTER — Ambulatory Visit: Payer: Medicare Other | Admitting: Cardiovascular Disease

## 2013-11-05 NOTE — Telephone Encounter (Signed)
Pt scheduled appt with VS for 12/12/13 at 10:45 to discuss Requip. Pt states that she does not want her PCP to follow her Requip any longer; since she has OSA and uses CPAP, would like Stephanie [name] to follow her for all OSA related issues.  Also, pt states that she received a letter stating that she needed to have face-to-face documentation for insurance purposes for CPAP cov'g Pt refused appt with TP--stated that she wants to see Stephanie [name] only.   Nothing further needed.

## 2013-11-05 NOTE — Telephone Encounter (Signed)
I believe her PCP is managing her requip.  She should either d/w her PCP or she will need ROV to evaluate issue further >> can schedule with Tammy Parrett if I do not have an ROV opening.

## 2013-11-05 NOTE — Telephone Encounter (Signed)
Spoke with pt Pt states that Requip 37m does not seem to be working Restless legs seems to be either worsening or medication is not as effective.  Pt reports difficulty sleeping-- takes Requip about 2 hours before bedtime. Takes Ambien at bedtime and still having trouble with sleep.   Pt aware that Dr [facility]alford Chessmanin hospital working today. Please advise Dr SHalford Chessman Thanks.

## 2013-11-08 ENCOUNTER — Telehealth: Payer: Self-pay

## 2013-11-08 ENCOUNTER — Ambulatory Visit: Payer: Self-pay | Admitting: Cardiovascular Disease

## 2013-11-08 DIAGNOSIS — R0602 Shortness of breath: Secondary | ICD-10-CM | POA: Diagnosis not present

## 2013-11-08 DIAGNOSIS — R0989 Other specified symptoms and signs involving the circulatory and respiratory systems: Secondary | ICD-10-CM

## 2013-11-08 DIAGNOSIS — E7800 Pure hypercholesterolemia, unspecified: Secondary | ICD-10-CM | POA: Diagnosis not present

## 2013-11-08 DIAGNOSIS — E119 Type 2 diabetes mellitus without complications: Secondary | ICD-10-CM | POA: Diagnosis not present

## 2013-11-08 DIAGNOSIS — R0609 Other forms of dyspnea: Secondary | ICD-10-CM | POA: Diagnosis not present

## 2013-11-08 DIAGNOSIS — R079 Chest pain, unspecified: Secondary | ICD-10-CM | POA: Diagnosis not present

## 2013-11-08 DIAGNOSIS — Z87891 Personal history of nicotine dependence: Secondary | ICD-10-CM | POA: Diagnosis not present

## 2013-11-08 NOTE — Telephone Encounter (Signed)
Pt would like stress test results. Please call.

## 2013-11-08 NOTE — Telephone Encounter (Signed)
Informed patient that we will call when results are in  Patient verbalized understanding

## 2013-11-11 DIAGNOSIS — G4733 Obstructive sleep apnea (adult) (pediatric): Secondary | ICD-10-CM | POA: Diagnosis not present

## 2013-11-12 ENCOUNTER — Telehealth: Payer: Self-pay | Admitting: *Deleted

## 2013-11-12 ENCOUNTER — Other Ambulatory Visit: Payer: Self-pay

## 2013-11-12 DIAGNOSIS — R0602 Shortness of breath: Secondary | ICD-10-CM

## 2013-11-12 NOTE — Telephone Encounter (Signed)
Please call patient regarding her nuclear stress test results done at [facility]

## 2013-11-14 ENCOUNTER — Other Ambulatory Visit: Payer: Self-pay

## 2013-11-14 ENCOUNTER — Other Ambulatory Visit (INDEPENDENT_AMBULATORY_CARE_PROVIDER_SITE_OTHER): Payer: Medicare Other

## 2013-11-14 DIAGNOSIS — R0602 Shortness of breath: Secondary | ICD-10-CM

## 2013-11-15 ENCOUNTER — Other Ambulatory Visit: Payer: Self-pay | Admitting: Family Medicine

## 2013-11-15 ENCOUNTER — Telehealth: Payer: Self-pay | Admitting: *Deleted

## 2013-11-15 NOTE — Telephone Encounter (Signed)
See result note.  

## 2013-11-15 NOTE — Telephone Encounter (Signed)
Patient called wanting her echo results.

## 2013-11-18 ENCOUNTER — Other Ambulatory Visit: Payer: Self-pay | Admitting: Internal Medicine

## 2013-11-18 ENCOUNTER — Other Ambulatory Visit: Payer: Self-pay | Admitting: Family Medicine

## 2013-11-18 ENCOUNTER — Encounter: Payer: Self-pay | Admitting: Family Medicine

## 2013-11-18 NOTE — Telephone Encounter (Signed)
sent in. plz notify pt.

## 2013-11-18 NOTE — Telephone Encounter (Signed)
Pt requesting medication refill.Last f/u appt was CPE 07/2013. Pls advise

## 2013-11-18 NOTE — Telephone Encounter (Signed)
Pt left v/m requesting status of nystatin topical powder to [facility]. Pt request cb when filled; pt going out of town.

## 2013-11-19 NOTE — Telephone Encounter (Signed)
Spoke to pt and informed her Rx has been sent to requested pharmacy 

## 2013-11-22 ENCOUNTER — Telehealth: Payer: Self-pay | Admitting: Pulmonary Disease

## 2013-11-22 IMAGING — CR DG CHEST 2V
1 series · 2 of 2 positions shown · non-contrast
Comparison: none

REASON FOR EXAM: SOB
COMMENTS:

[Series 1: w chest pa · 0.14mm/px · 2 of 2 slices shown]
[im 1/2]
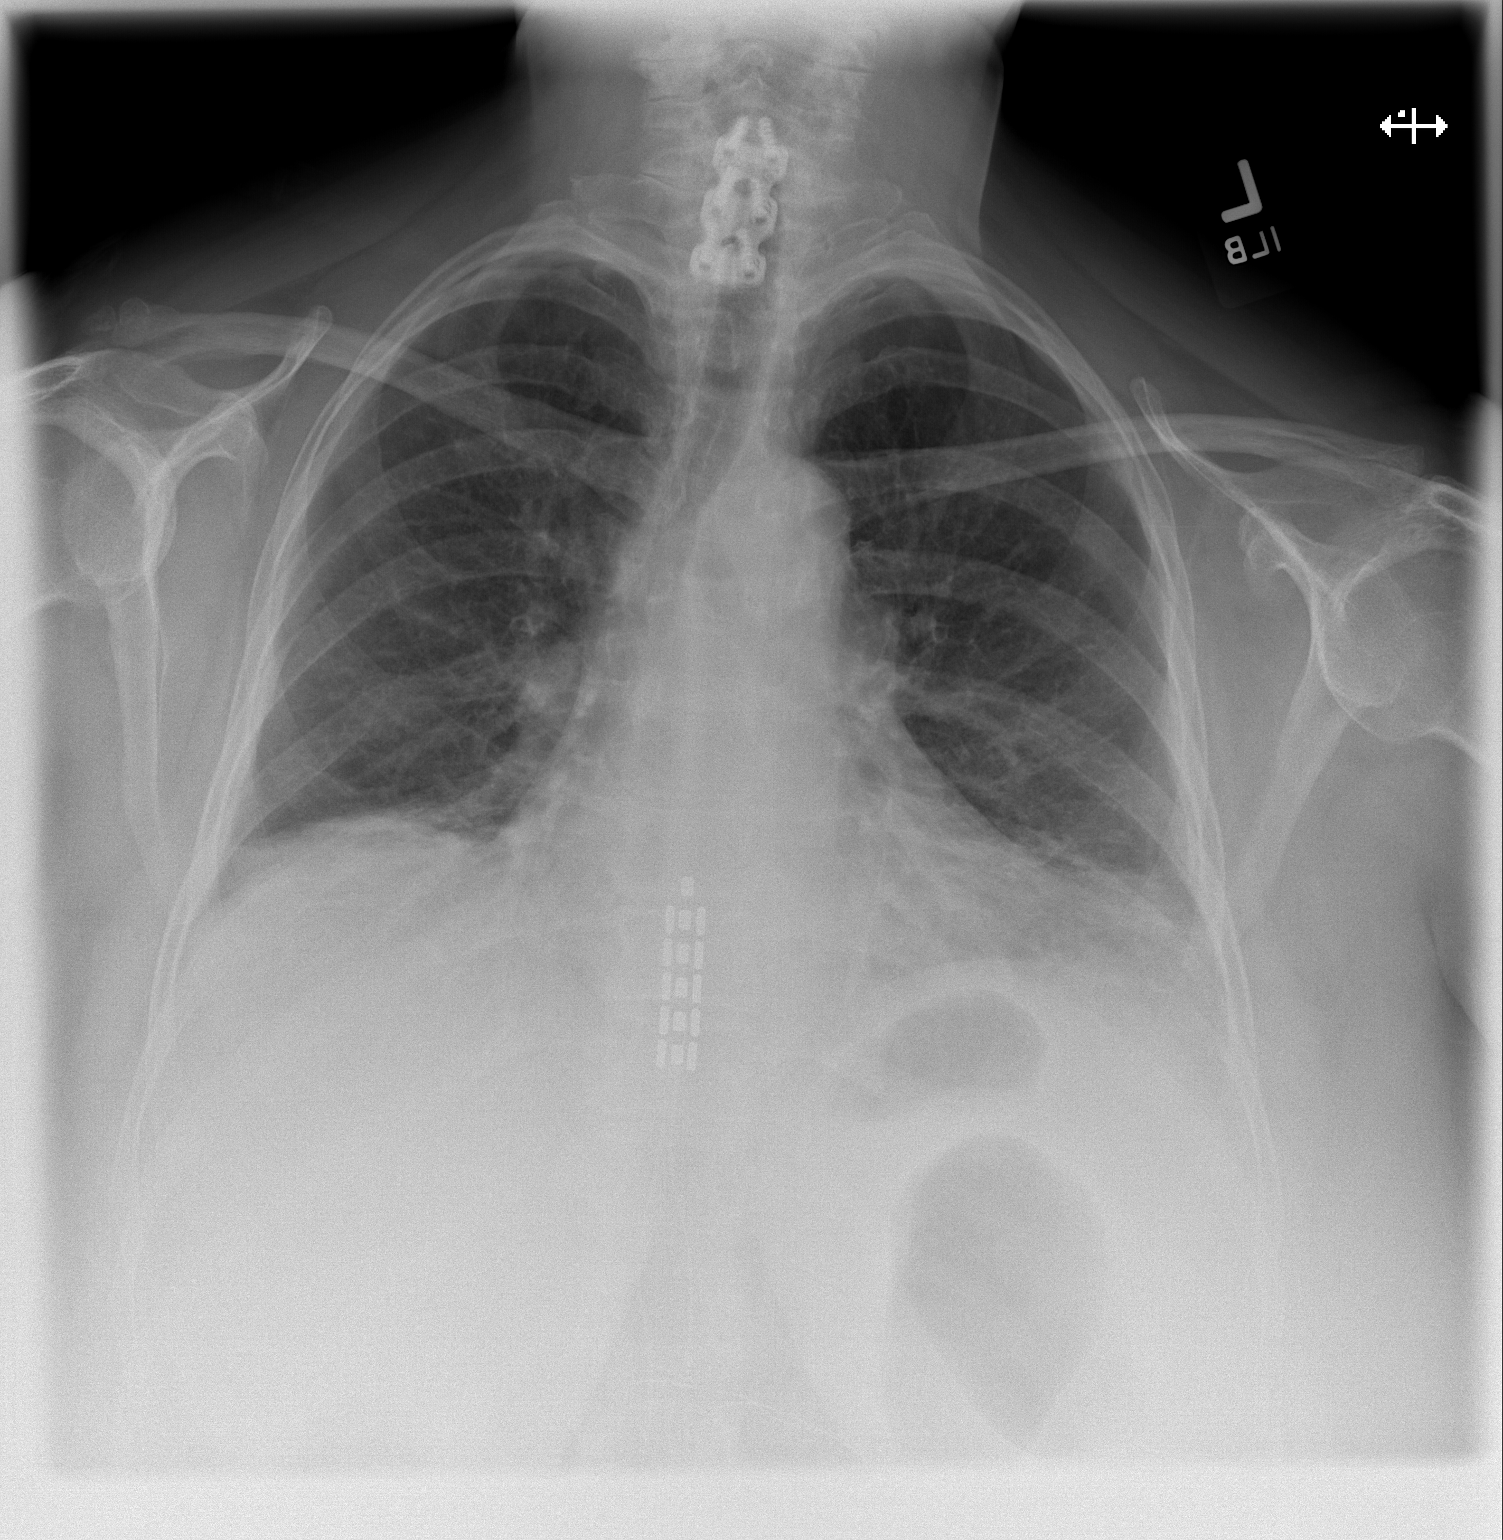
[im 2/2]
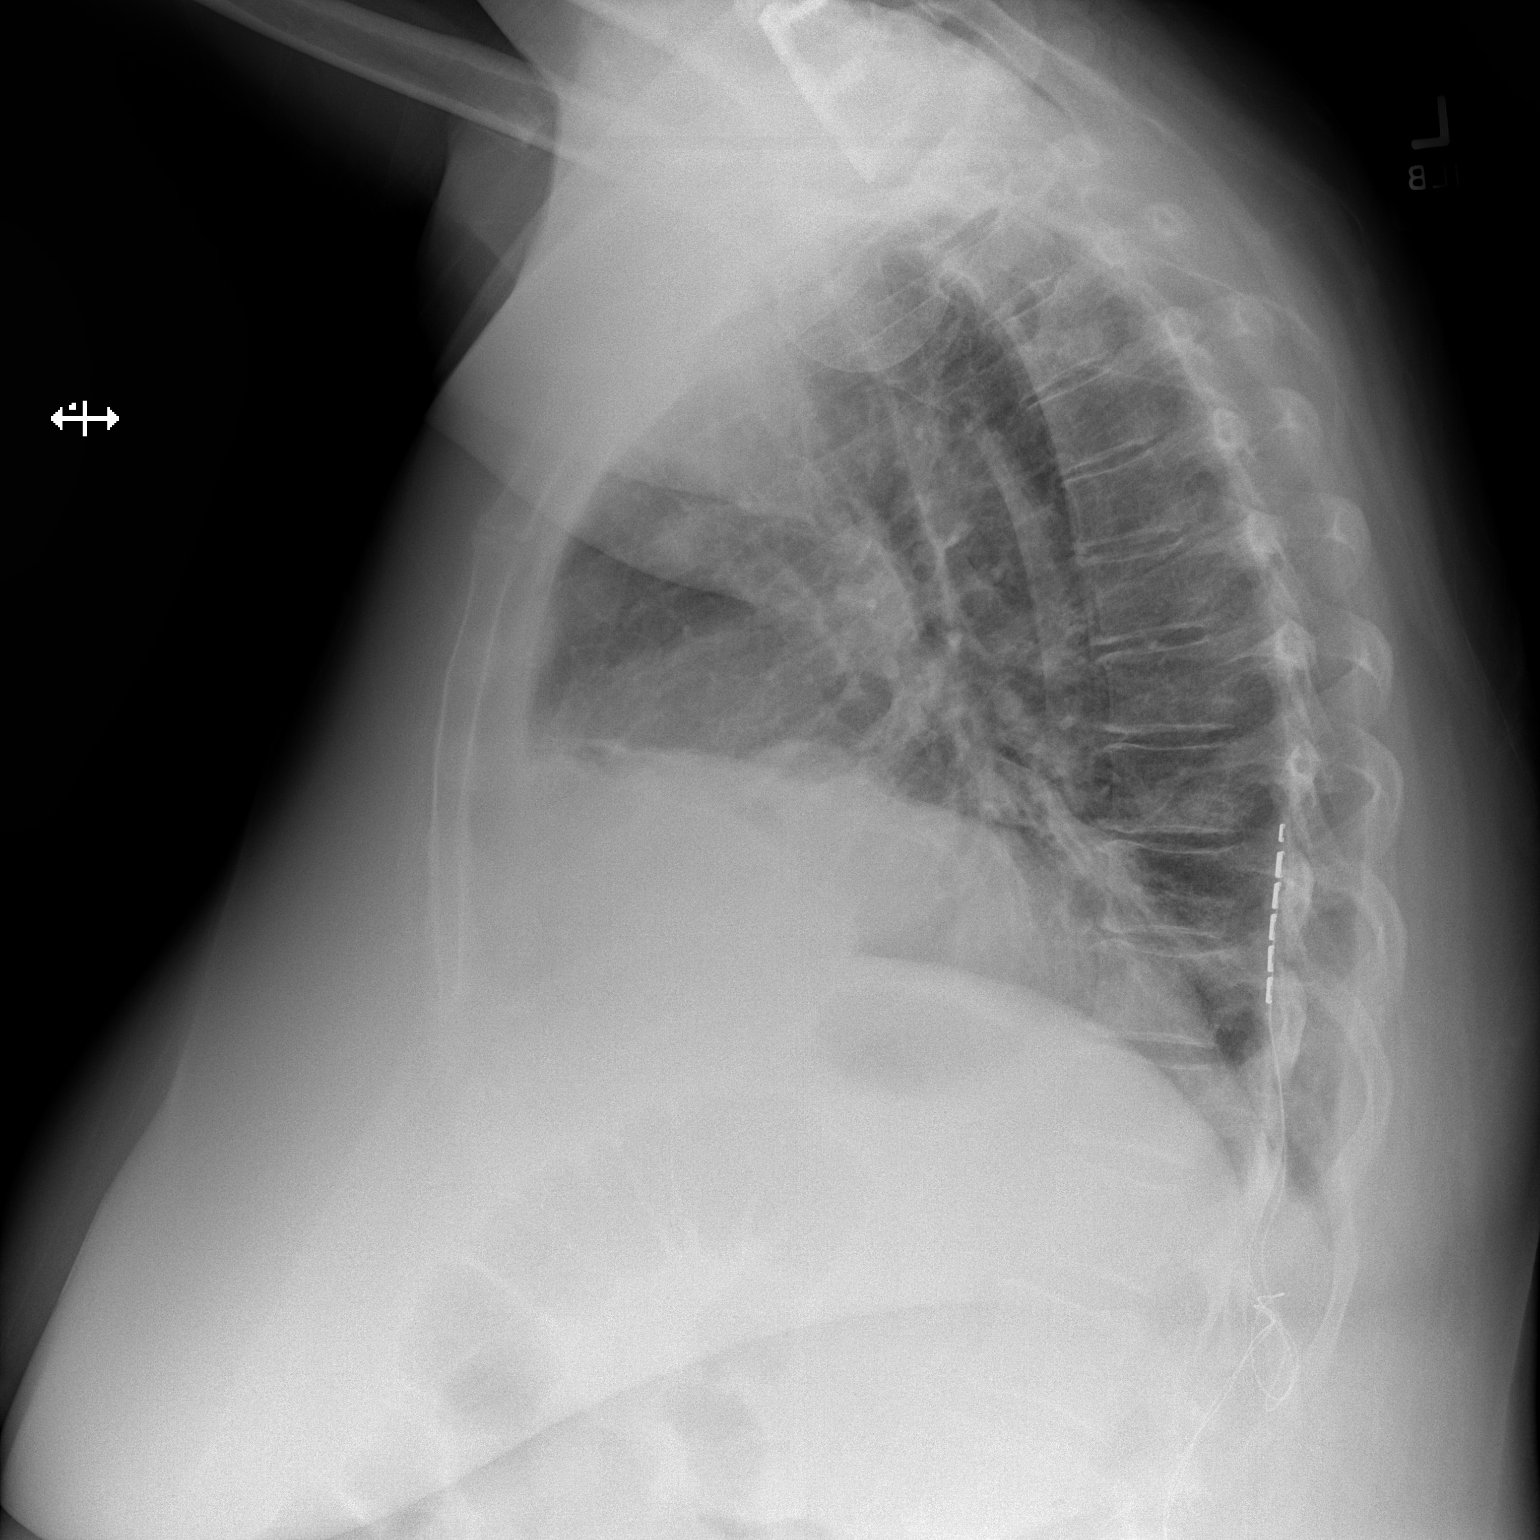

[2 of 2 positions shown; findings below may reference images not displayed]

PROCEDURE:     DXR - DXR CHEST PA (OR AP) AND LATERAL  - April 03, 2013  [DATE]

RESULT:     Anterior cervical hardware is present. There is left lung base
atelectasis or early infiltrate with minimal right lung base atelectasis.
The electrode leads are seen in the spinal canal region over the lower
thoracic area.
IMPRESSION: 1. Lung base atelectasis. Early infiltrate is not excluded. No cardiomegaly
or edema.

[REDACTED]

## 2013-11-22 IMAGING — CT CT CHEST W/ CM
1 series · 15 of 32 positions shown, 19 images · IV contrast (agent unspecified)
Comparison: none

REASON FOR EXAM: chest pain, dyspnea, tachycardia, hypoxia, equivocal
CXR, r/o PE
COMMENTS:

PROCEDURE:     CT  - CT CHEST WITH CONTRAST  - April 03, 2013  [DATE]
RESULT:     Chest CT dated 04/03/2013 comparison made 07/01/2012
TECHNIQUE: Helical 3 mm sections were obtained from the thoracic inlet
through the lung bases status post intravenous administration of 1[REDACTED].

[Series 5: soft tissue · axial · 0.63mm/px · z∈[-478,-230]mm · 15 of 92 slices shown, 19 images]
[im 6/92  soft-tissue]
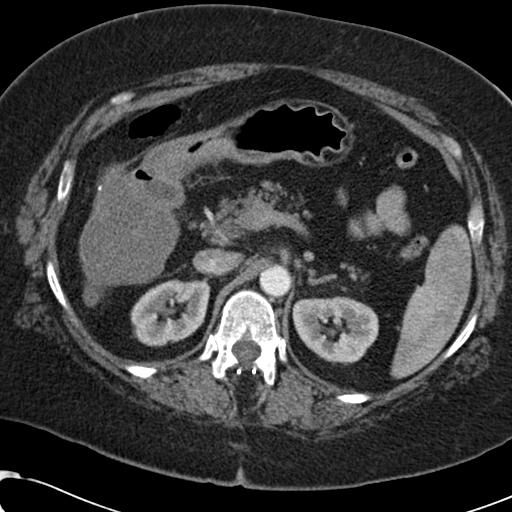
[im 6/92  bone]
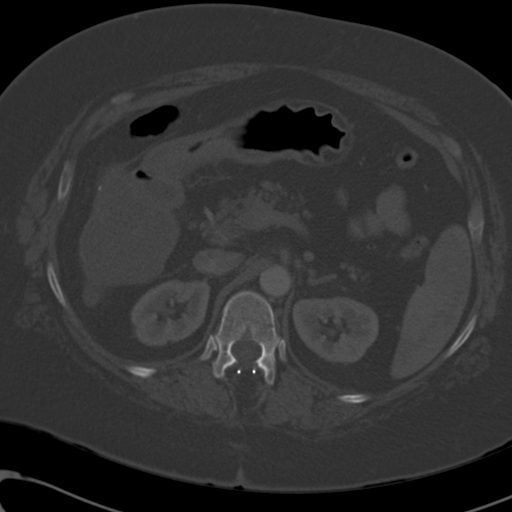
[im 12/92  soft-tissue]
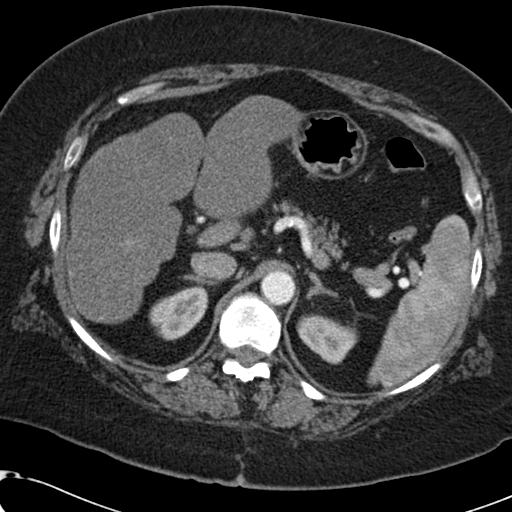
[im 18/92  soft-tissue]
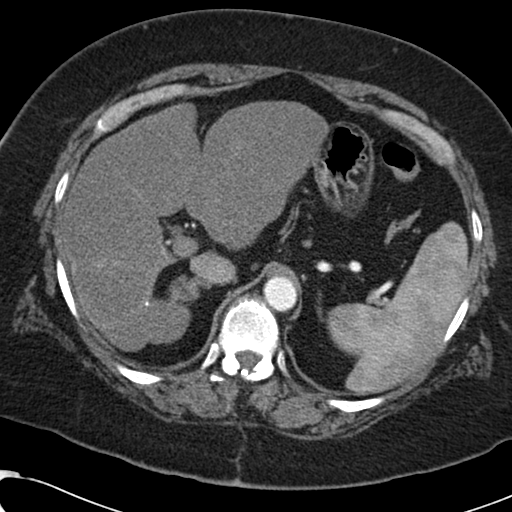
[im 27/92  soft-tissue]
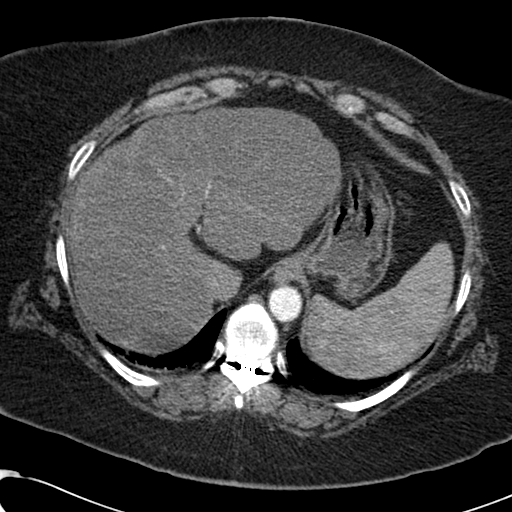
[im 33/92  soft-tissue]
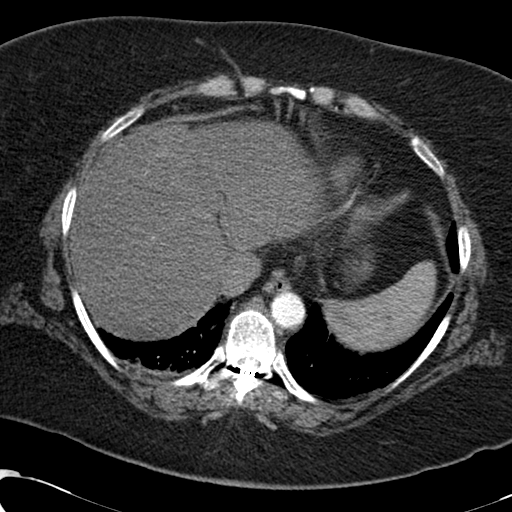
[im 39/92  soft-tissue]
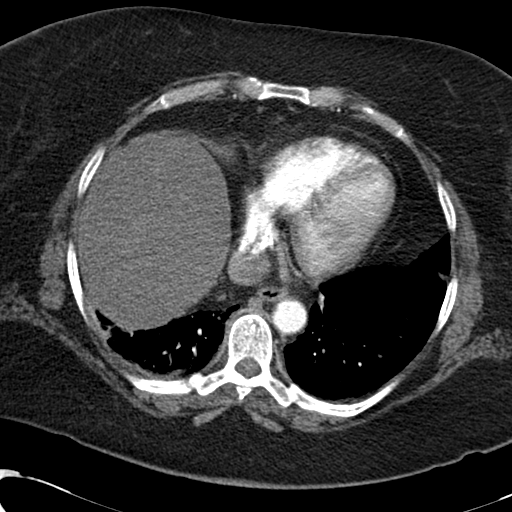
[im 47/92  soft-tissue]
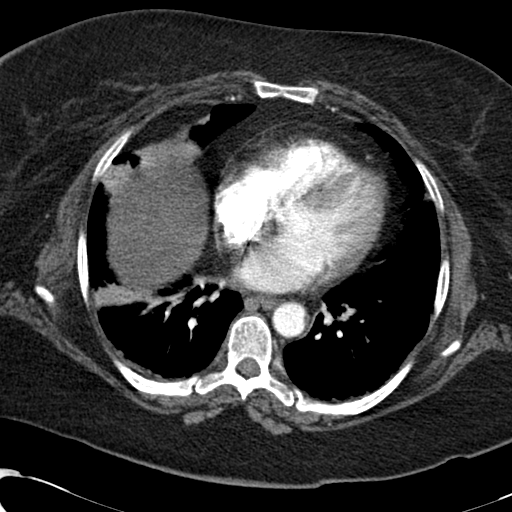
[im 53/92  soft-tissue]
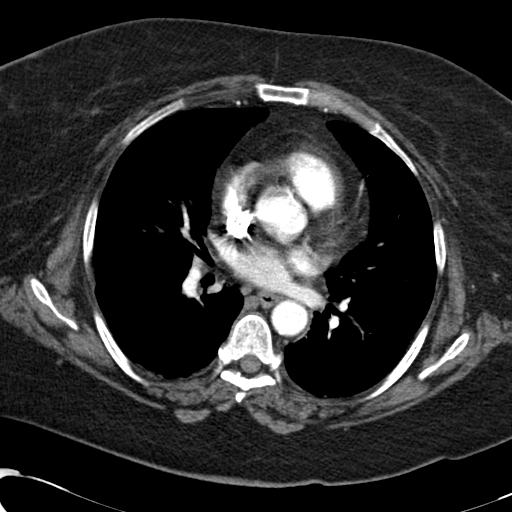
[im 59/92  soft-tissue]
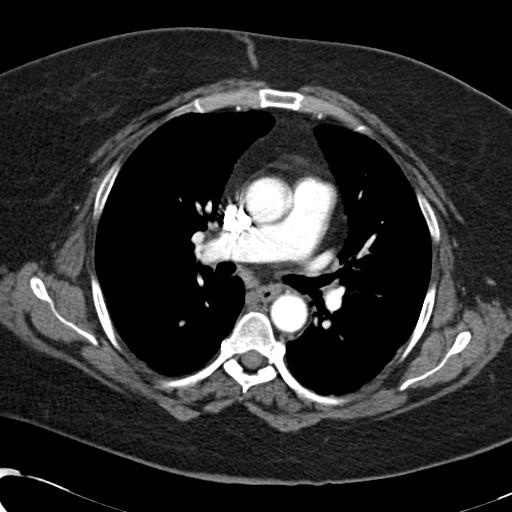
[im 59/92  bone]
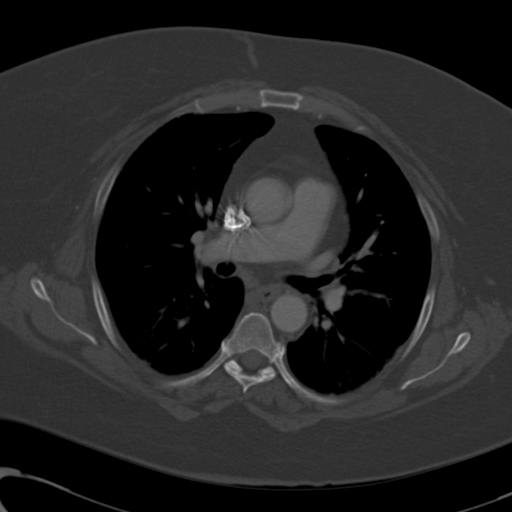
[im 65/92  soft-tissue]
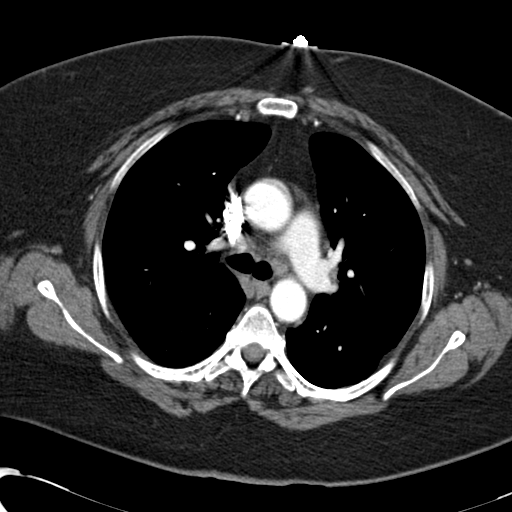
[im 74/92  soft-tissue]
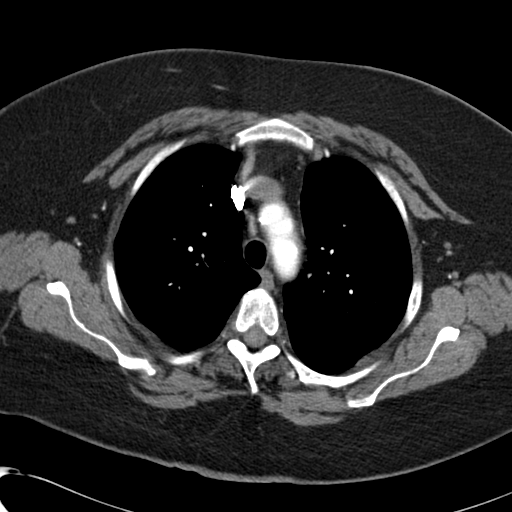
[im 80/92  soft-tissue]
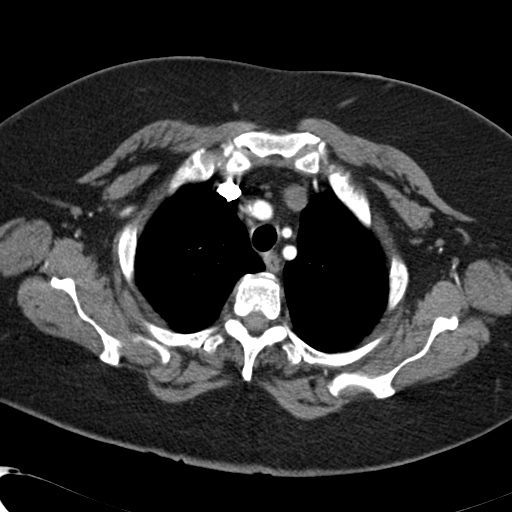
[im 80/92  lung]
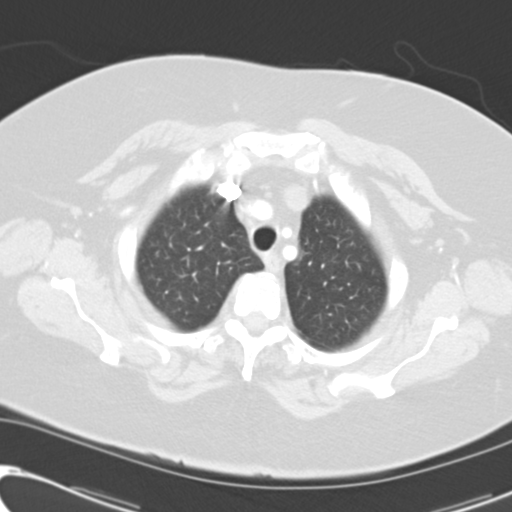
[im 83/92  lung]
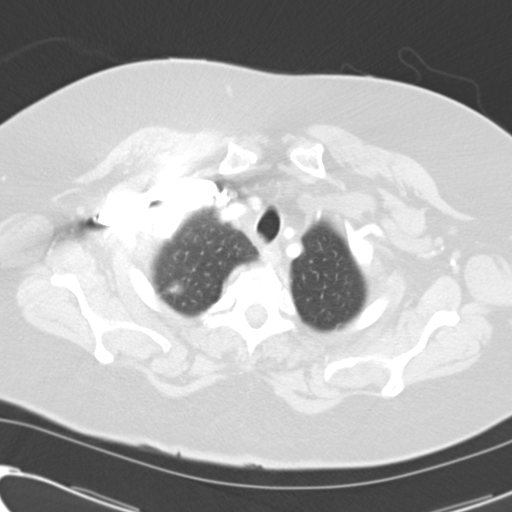
[im 86/92  soft-tissue]
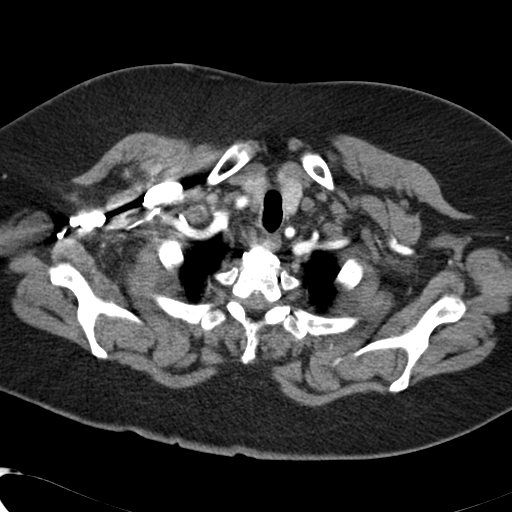
[im 86/92  lung]
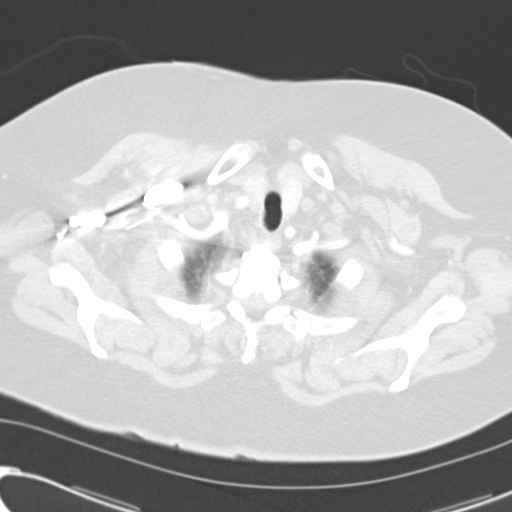
[im 89/92  lung]
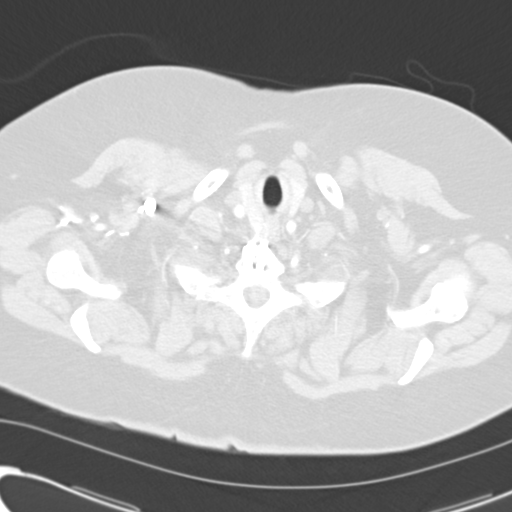

[15 of 32 positions shown; findings below may reference images not displayed]

FINDINGS: The thoracic inlet and mediastinum are unremarkable the there is
no evidence of filling defects within the main, lobar, or segmental
pulmonary arteries. The lung parenchyma demonstrates ill-defined areas of
increased density within the lung bases. Differential considerations of
atelectasis versus infiltrate.

There is diffuse low-attenuation within. A calcified density is appreciated
within the right consistent with the sequela of prior granulomatous disease.
The remaining visualized upper abdominal viscera grossly unremarkable. There
is no evidence of a thoracic aortic aneurysm.
IMPRESSION: No CT evidence of pulmonary to embolic disease and
atelectasis versus infiltrate in the lung bases
3. Hepatic steatosis

## 2013-11-22 NOTE — Telephone Encounter (Signed)
Results have been explained to patient, pt expressed understanding. Nothing further needed.  

## 2013-11-22 NOTE — Telephone Encounter (Signed)
ONO with CPAP and RA 11/11/13 >> Test time 8 hrs 59 min.  Mean SpO2 92%, low SpO2 81%.  Spent 9 min with SpO2 < 88%.  Will have my nurse inform pt that ONO looked okay.  She does not need oxygen at night.  She should continue using her CPAP.  Will discuss in more detail at her ROV in July.

## 2013-11-23 IMAGING — CR DG CHEST 2V
1 series · 3 of 3 positions shown · non-contrast
Comparison: none

REASON FOR EXAM: compare- pneumonia/ infiltrates.
COMMENTS:

[Series 3: w chest pa · 0.14mm/px · 3 of 3 slices shown]
[im 1/3]
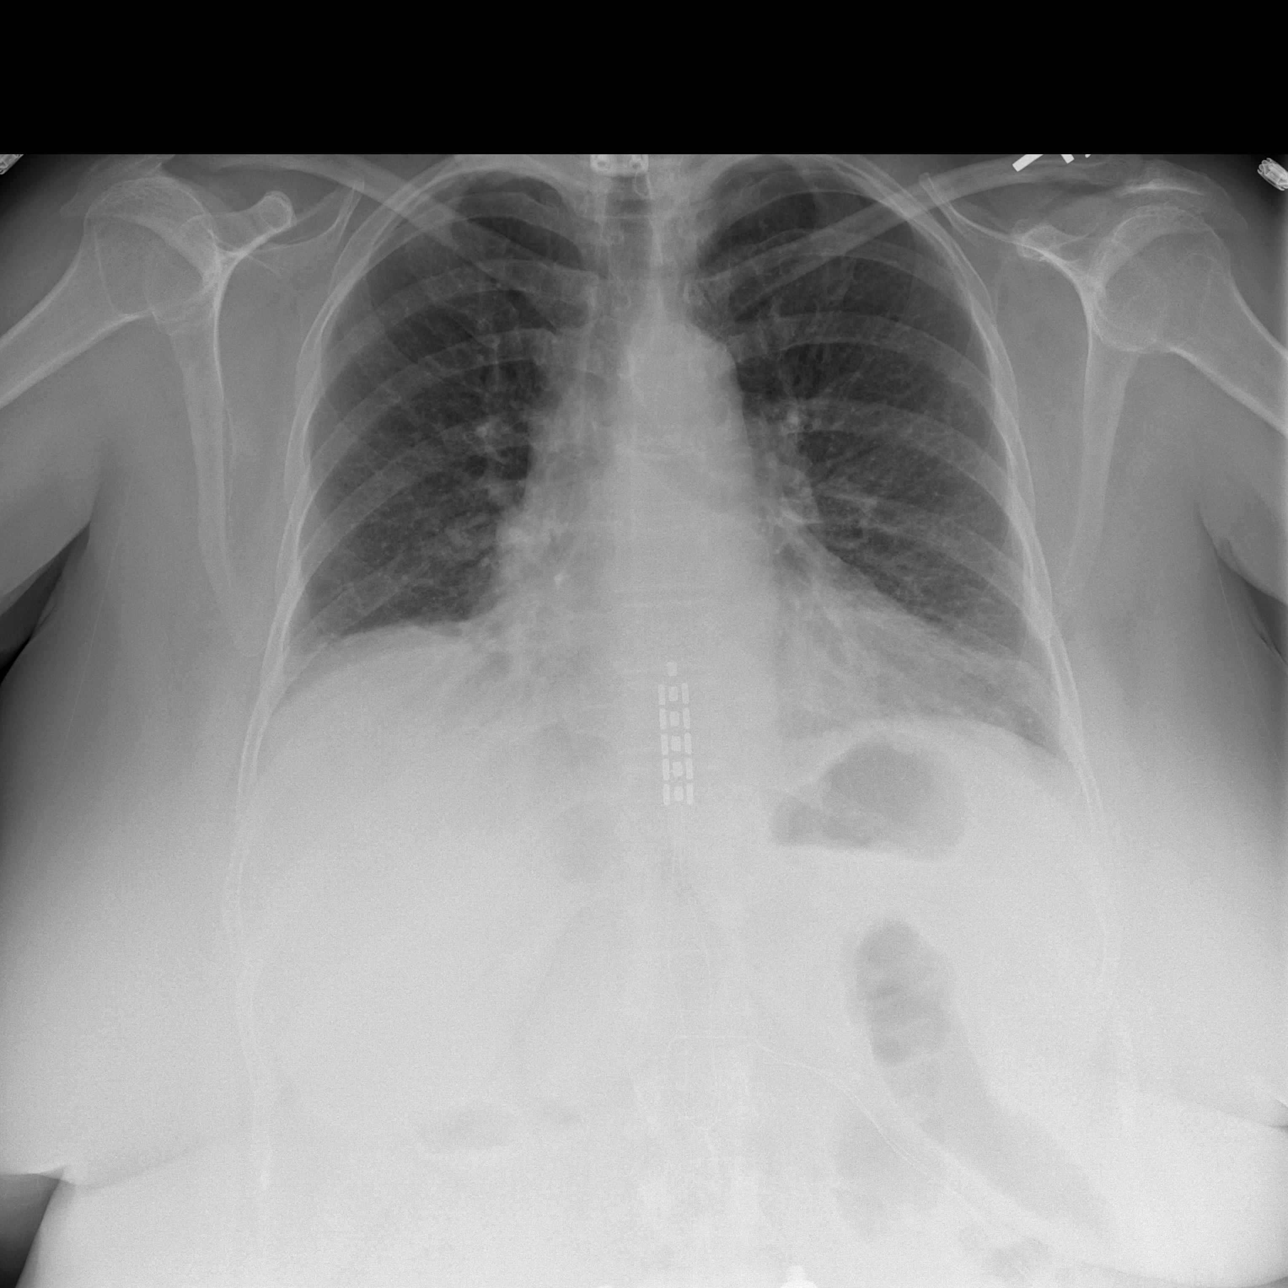
[im 2/3]
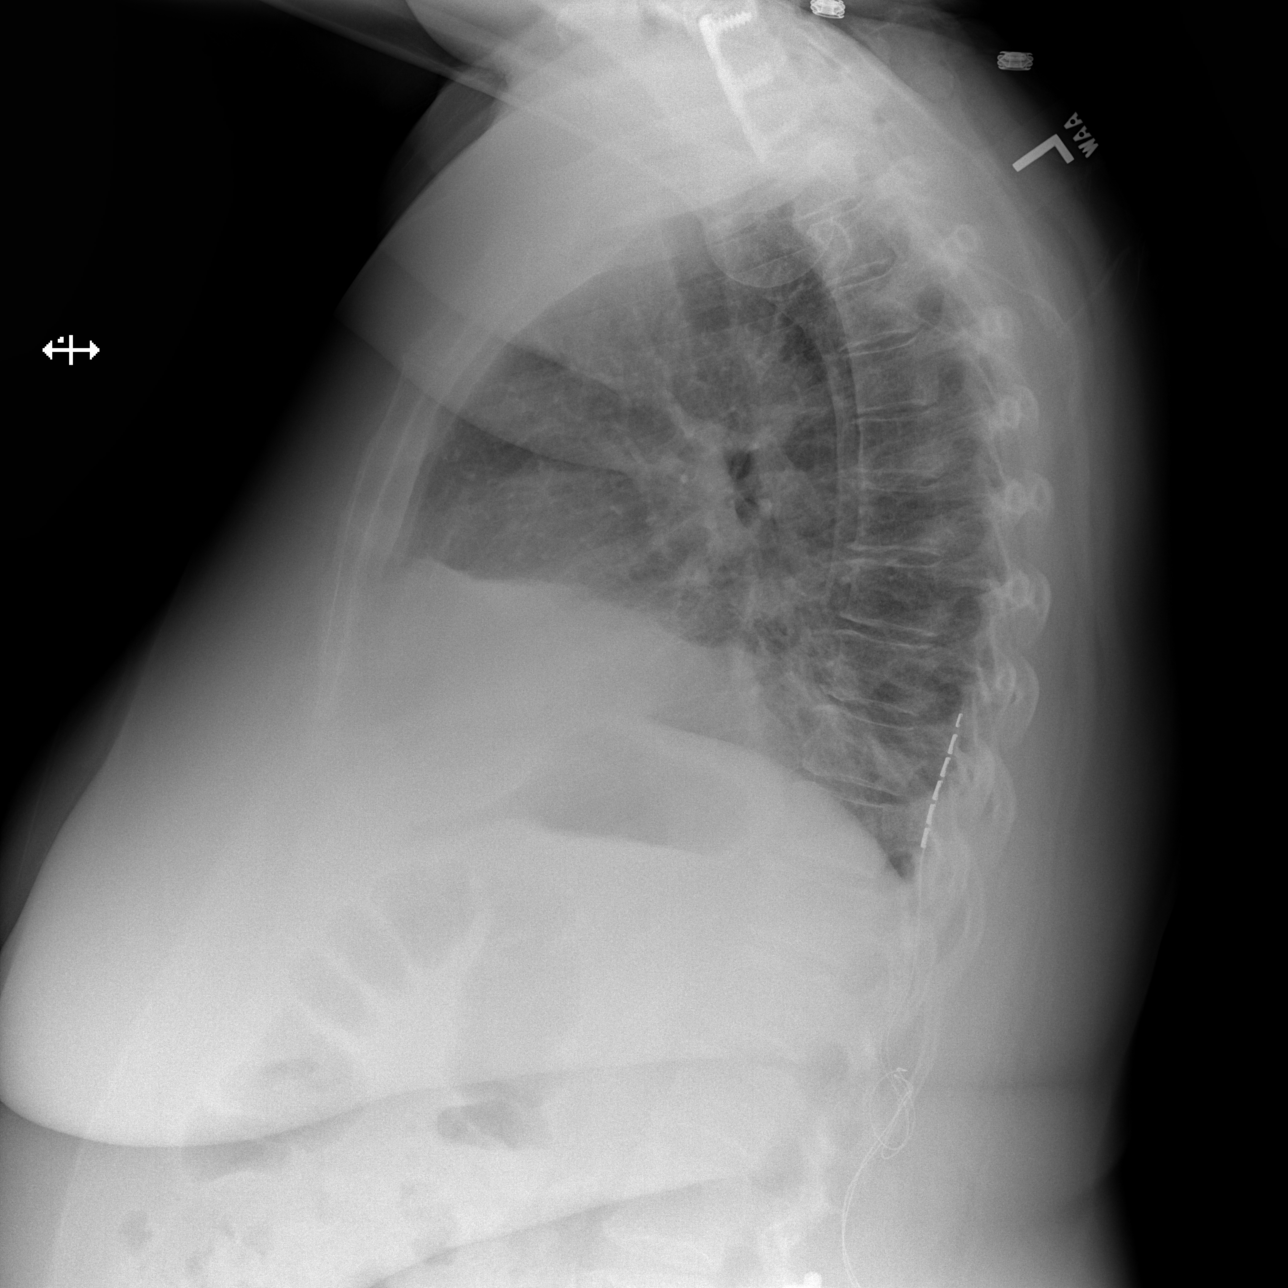
[im 3/3]
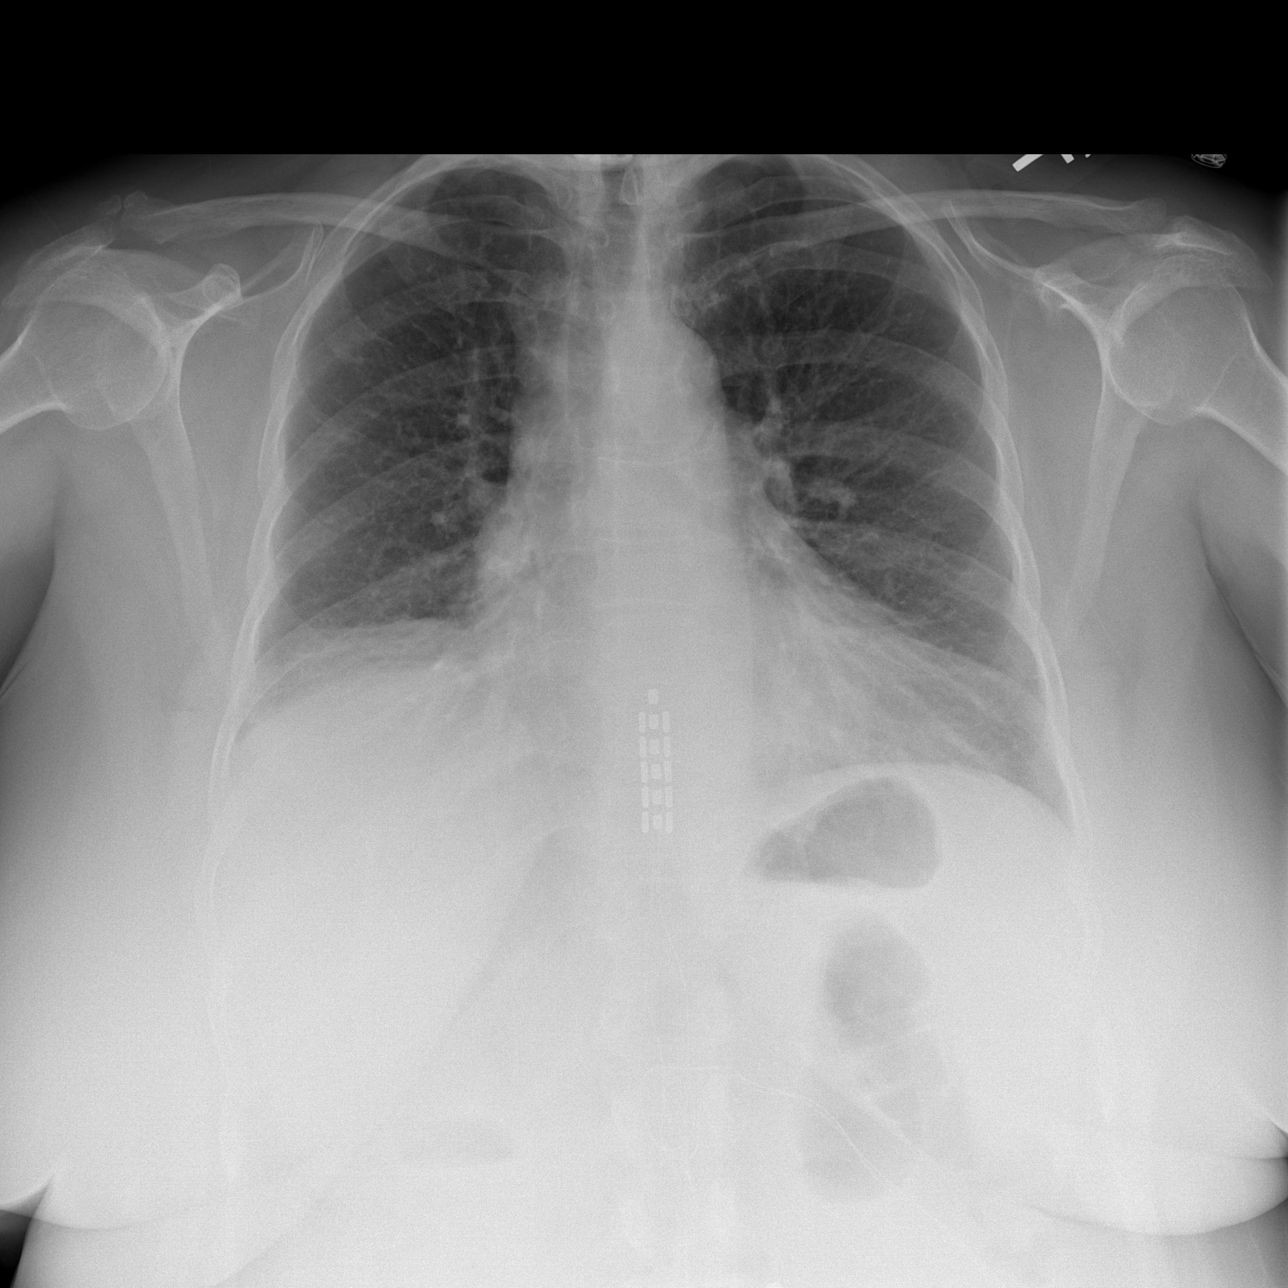

[3 of 3 positions shown; findings below may reference images not displayed]

PROCEDURE:     DXR - DXR CHEST PA (OR AP) AND LATERAL  - April 04, 2013  [DATE]

RESULT:     Comparison is made to the study of 04/03/2013. There is improved
aeration at the lung bases. There is mild elevation of the right
hemidiaphragm. Some minimal residual atelectasis appears present. There is
no overt edema, evidence of effusion or pneumothorax. The heart is
borderline enlarged but unchanged.
IMPRESSION: Slightly improved aeration.

[REDACTED]

## 2013-11-24 ENCOUNTER — Other Ambulatory Visit: Payer: Self-pay | Admitting: Family Medicine

## 2013-11-25 ENCOUNTER — Other Ambulatory Visit: Payer: Self-pay | Admitting: Family Medicine

## 2013-11-25 MED ORDER — CLONAZEPAM 0.5 MG PO TABS: ORAL_TABLET | ORAL | Status: DC

## 2013-11-25 MED ORDER — DIAZEPAM 5 MG PO TABS: ORAL_TABLET | ORAL | Status: DC

## 2013-11-25 NOTE — Telephone Encounter (Signed)
Spoke to pt and informed her Rx has been faxed to requested pharmacy

## 2013-11-25 NOTE — Telephone Encounter (Signed)
Rx for both meds last written on 09/30/13 for #60x0 each. Pt's last cpx in 07/2013, last ov was 10/28/13.

## 2013-11-25 NOTE — Telephone Encounter (Signed)
Pt left v/m; pt is going out of town and request refill clonazepam and diazepam to [facility]. Pt request cb when refilled.

## 2013-11-26 ENCOUNTER — Other Ambulatory Visit: Payer: Self-pay | Admitting: Family Medicine

## 2013-11-26 NOTE — Telephone Encounter (Signed)
Last filled 07/29/13--please advise if pt will need f/u or okay to refill

## 2013-12-12 ENCOUNTER — Encounter: Payer: Self-pay | Admitting: Pulmonary Disease

## 2013-12-12 ENCOUNTER — Ambulatory Visit (INDEPENDENT_AMBULATORY_CARE_PROVIDER_SITE_OTHER): Payer: Medicare Other | Admitting: Pulmonary Disease

## 2013-12-12 ENCOUNTER — Other Ambulatory Visit (INDEPENDENT_AMBULATORY_CARE_PROVIDER_SITE_OTHER): Payer: Medicare Other

## 2013-12-12 ENCOUNTER — Other Ambulatory Visit: Payer: Self-pay | Admitting: Family Medicine

## 2013-12-12 VITALS — BP 122/82 | HR 91 | Ht 63.0 in | Wt 212.8 lb

## 2013-12-12 DIAGNOSIS — J449 Chronic obstructive pulmonary disease, unspecified: Secondary | ICD-10-CM

## 2013-12-12 DIAGNOSIS — R0609 Other forms of dyspnea: Secondary | ICD-10-CM | POA: Diagnosis not present

## 2013-12-12 DIAGNOSIS — J4489 Other specified chronic obstructive pulmonary disease: Secondary | ICD-10-CM

## 2013-12-12 DIAGNOSIS — R911 Solitary pulmonary nodule: Secondary | ICD-10-CM

## 2013-12-12 DIAGNOSIS — D508 Other iron deficiency anemias: Secondary | ICD-10-CM | POA: Diagnosis not present

## 2013-12-12 DIAGNOSIS — R0989 Other specified symptoms and signs involving the circulatory and respiratory systems: Secondary | ICD-10-CM

## 2013-12-12 DIAGNOSIS — R0600 Dyspnea, unspecified: Secondary | ICD-10-CM

## 2013-12-12 DIAGNOSIS — G4733 Obstructive sleep apnea (adult) (pediatric): Secondary | ICD-10-CM | POA: Diagnosis not present

## 2013-12-12 DIAGNOSIS — G2581 Restless legs syndrome: Secondary | ICD-10-CM

## 2013-12-12 LAB — COMPREHENSIVE METABOLIC PANEL
ALK PHOS: 128 U/L — AB (ref 39–117)
ALT: 30 U/L (ref 0–35)
AST: 46 U/L — ABNORMAL HIGH (ref 0–37)
Albumin: 3.7 g/dL (ref 3.5–5.2)
BILIRUBIN TOTAL: 0.6 mg/dL (ref 0.2–1.2)
BUN: 17 mg/dL (ref 6–23)
CO2: 24 meq/L (ref 19–32)
CREATININE: 0.8 mg/dL (ref 0.4–1.2)
Calcium: 9.6 mg/dL (ref 8.4–10.5)
Chloride: 104 mEq/L (ref 96–112)
GFR: 78.05 mL/min (ref >60.00)
Glucose, Bld: 113 mg/dL — ABNORMAL HIGH (ref 70–99)
Potassium: 4.5 mEq/L (ref 3.5–5.1)
Sodium: 136 mEq/L (ref 135–145)
Total Protein: 7.6 g/dL (ref 6.0–8.3)

## 2013-12-12 LAB — CBC
HEMATOCRIT: 37.1 % (ref 36.0–46.0)
HEMOGLOBIN: 12.4 g/dL (ref 12.0–15.0)
MCHC: 33.4 g/dL (ref 30.0–36.0)
MCV: 89.4 fl (ref 78.0–100.0)
Platelets: 158 10*3/uL (ref 150.0–400.0)
RBC: 4.15 Mil/uL (ref 3.87–5.11)
RDW: 15.2 % (ref 11.5–15.5)
WBC: 6.7 10*3/uL (ref 4.0–10.5)

## 2013-12-12 LAB — FERRITIN: Ferritin: 55.9 ng/mL (ref 10.0–291.0)

## 2013-12-12 NOTE — Patient Instructions (Signed)
Lab tests today and call with results Will schedule breathing test (PFT) and call with results Follow up in 2 months

## 2013-12-12 NOTE — Progress Notes (Signed)
Chief Complaint  Patient presents with  . Follow-up    Discuss Requip. Pt c/o increased restless legs. Using CPAP nightly.     History of Present Illness: Stephanie [name] is a 63 y.o. female former smoker with OSA, dyspnea with hx of COPD, RLS and lung nodule followed by Dr. Roxan Hockey.  She has been using proair twice per day.  She needs refill for this.  She gets winded with minimal activity.  She gets occasional cough.  She denies chest pain, palpitations, or swelling.  She was recently evaluated by cardiology.  Her Echo was normal.  She had f/u CT chest in January 2015.  She is using CPAP nightly w/o difficulty.  She has noticed more trouble with her restless legs.  TESTS: CT chest 06/22/11 >> focal GGO Rt apex 7 mm, 3 mm nodule LLL CT chest 07/02/13 >> 8 mm GGO nodule Rt apex, 3 mm nodule LLL PSG 08/13/13 >> REM AHI 27.9, SaO2 low 82%, Spent 15.1 min with SpO2 < 90%. CPAP 09/06/13 >> CPAP 8 >> AHI 0, + R, + S. PSG 10/18/13 >> AHI 6.9, SaO2 low 82%. Spent 139.2 min with SaO2 < 89%. ONO with CPAP and RA 11/11/13 >> Test time 8 hrs 59 min. Mean SpO2 92%, low SpO2 81%. Spent 9 min with SpO2 < 88%. Auto CPAP 11/04/13 to 12/03/13 >> used on 29 of 30 nights with average 6 hrs and 41 min.  Average AHI is 0.9 with CPAP 8 cm H2O. Echo 11/14/13 >> EF 55 to 60%  Stephanie [name]  has a past medical history of Adenomatous colon polyp; Insomnia; RLS (restless legs syndrome); Colon polyp; Incontinence; Hepatitis B, chronic; H/O: rheumatic fever; Hyperlipidemia; Unspecified disorders of nervous system; Bipolar affective disorder; H/O: CVA (cardiovascular accident); Fatty liver (04/09/08); Shortness of breath; Obstructive sleep apnea; Blood transfusion (2000); GERD (gastroesophageal reflux disease); Arthritis; Aortic dissection; COPD (chronic obstructive pulmonary disease); Anxiety; Depression; Heart murmur; Stroke; Ulcer; and Diabetes mellitus without complication.  SHAKENDRA GRIFFETH  has past surgical  history that includes Back surgery; Cholecystectomy; Rotator cuff repair; Abdominal hysterectomy; Lumbar fusion (10/09); Hemorroidectomy; Abdominal exploration surgery; Posterior cervical fusion/foraminotomy; Eye surgery; Lumbar wound debridement (05/09/2011); Axillary artery cannulation via 8-mm Hemashield graft, median sternotomy, extracorporeal circulation with deep hypothermic circulatory arrest, repair of  aortic dessection (01/23/2009); cataract; Colonoscopy; and Liver biopsy (04/10/2012).  Prior to Admission medications   Medication Sig Start Date End Date Taking? Authorizing Provider  ABILIFY 30 MG tablet TAKE 1 TABLET BY MOUTH EVERY MORNING 03/30/11  Yes Lucille Passy, MD  albuterol (PROAIR HFA) 108 (90 BASE) MCG/ACT inhaler INHALE 2 PUFFS INTO THE LUNGS EVERY 6 (SIX) HOURS AS NEEDED FOR WHEEZING. 11/12/12  Yes Lucille Passy, MD  ALORA 0.1 MG/24HR patch PLACE 1 PATCH (0.1 MG TOTAL) ONTO THE SKIN 2 (TWO) TIMES A WEEK. 09/30/13  Yes Lucille Passy, MD  clonazePAM (KLONOPIN) 0.5 MG tablet TAKE 1 TABLET BY MOUTH TWICE DAILY AS NEEDED FOR ANXIETY 11/25/13  Yes Lucille Passy, MD  [facility] STOOL SOFTENER 100 MG capsule TAKE 1 CAPSULE BY MOUTH 2 TIMES DAILY AS NEEDED FOR CONSTIPATION   Yes Lucille Passy, MD  diazepam (VALIUM) 5 MG tablet TAKE 1 TABLET BY MOUTH TWICE DAILY AS NEEDED FOR MUSCLE SPASMS 11/25/13  Yes Lucille Passy, MD  lamoTRIgine (LAMICTAL) 100 MG tablet Take 3 tablets by mouth once daily   Yes Historical Provider, MD  lidocaine (LIDODERM) 5 % PLACE 1 PATCH ONTO THE SKIN  DAILY. REMOVE & DISCARD PATCH WITHIN 12 HOURS OR AS DIRECTED BY MD   Yes Lucille Passy, MD  metFORMIN (GLUCOPHAGE) 1000 MG tablet Take 0.5 tablets (500 mg total) by mouth 2 (two) times daily with a meal. 10/30/13  Yes Lucille Passy, MD  MICROLET LANCETS MISC Use to test blood sugar once daily 10/10/13  Yes Lucille Passy, MD  mirtazapine (REMERON) 15 MG tablet TAKE 1 TABLET BY MOUTH AT BEDTIME   Yes Lucille Passy, MD  mirtazapine (REMERON) 15 MG  tablet TAKE 1 TABLET BY MOUTH AT BEDTIME 11/18/13  Yes Lucille Passy, MD  nystatin (MYCOSTATIN/NYSTOP) 100000 UNIT/GM POWD APPLY TOPICALLY 2 TIMES DAILY AS DIRECTED   Yes Ria Bush, MD  nystatin-triamcinolone ointment (MYCOLOG) APPLY 1 APPLICATION TOPICALLY 2 (TWO) TIMES DAILY.   Yes Lucille Passy, MD  omeprazole (PRILOSEC) 40 MG capsule TAKE 1 CAPSULE BY MOUTH DAILY. 11/18/13  Yes Lucille Passy, MD  promethazine (PHENERGAN) 25 MG tablet TAKE 1/2 TO 1 TABLET BY MOUTH EVERY 8 HOURS AS NEEDED FOR NAUSEA 11/26/13  Yes Lucille Passy, MD  rOPINIRole (REQUIP) 3 MG tablet TAKE 1 TABLET BY MOUTH AT BEDTIME   Yes Lucille Passy, MD  salicylic acid 6 % gel APPLY TO AFFECTED AREA EVERY DAY *USE AT NIGHT AND RINSE OFF IN THE MORNING*   Yes Lucille Passy, MD  simvastatin (ZOCOR) 10 MG tablet Take 1 tablet (10 mg total) by mouth at bedtime. 10/30/13  Yes Lucille Passy, MD  tiotropium (SPIRIVA HANDIHALER) 18 MCG inhalation capsule Place 1 capsule (18 mcg total) into inhaler and inhale daily. 04/25/13  Yes Lucille Passy, MD  tolterodine (DETROL LA) 2 MG 24 hr capsule TAKE ONE CAPSULE BY MOUTH DAILY 04/25/13  Yes Lucille Passy, MD  traMADol (ULTRAM) 50 MG tablet TAKE 2 TABLETS BY MOUTH EVERY MORNING AS NEEDED AND 2 TABLETS EVERY EVENING AS NEEDED 10/29/13  Yes Lucille Passy, MD  urea (CARMOL) 40 % CREA Apply nightly to feet 03/14/12  Yes Historical Provider, MD  vitamin D, CHOLECALCIFEROL, 400 UNITS tablet Take two by mouth daily 01/30/12  Yes Lucille Passy, MD  zolpidem (AMBIEN) 5 MG tablet TAKE 1/2 TO 1 TABLET BY MOUTH AT BEDTIME AS NEEDED   Yes Lucille Passy, MD    No Known Allergies   Physical Exam:  General - No distress ENT - No sinus tenderness, no oral exudate, no LAN Cardiac - s1s2 regular, no murmur Chest - No wheeze/rales/dullness Back - No focal tenderness Abd - Soft, non-tender Ext - No edema Neuro - Normal strength Skin - No rashes Psych - normal mood, and behavior   Assessment/Plan:  Chesley Mires,  MD [facility] Pager:  [phone]

## 2013-12-12 NOTE — Telephone Encounter (Signed)
Lm on pts vm informing her Rx has been called in to requested pharmacy

## 2013-12-12 NOTE — Telephone Encounter (Signed)
Pt requesting medication refill. Last f/u appt 07/2013-CPE with no future appts scheduled. pls advise

## 2013-12-13 ENCOUNTER — Telehealth: Payer: Self-pay | Admitting: Pulmonary Disease

## 2013-12-13 MED ORDER — FERROUS SULFATE 325 (65 FE) MG PO TABS: 325.0000 mg | ORAL_TABLET | Freq: Two times a day (BID) | ORAL | Status: DC

## 2013-12-13 NOTE — Telephone Encounter (Signed)
CMP     Component Value Date/Time   NA 136 12/12/2013 1114   K 4.5 12/12/2013 1114   CL 104 12/12/2013 1114   CO2 24 12/12/2013 1114   GLUCOSE 113* 12/12/2013 1114   BUN 17 12/12/2013 1114   CREATININE 0.8 12/12/2013 1114   CALCIUM 9.6 12/12/2013 1114   PROT 7.6 12/12/2013 1114   ALBUMIN 3.7 12/12/2013 1114   AST 46* 12/12/2013 1114   ALT 30 12/12/2013 1114   ALKPHOS 128* 12/12/2013 1114   BILITOT 0.6 12/12/2013 1114   GFRNONAA 89* 05/09/2011 1158   GFRAA >90 05/09/2011 1158    CBC    Component Value Date/Time   WBC 6.7 12/12/2013 1114   RBC 4.15 12/12/2013 1114   HGB 12.4 12/12/2013 1114   HCT 37.1 12/12/2013 1114   PLT 158.0 12/12/2013 1114   MCV 89.4 12/12/2013 1114   MCH 29.9 05/09/2011 1158   MCHC 33.4 12/12/2013 1114   RDW 15.2 12/12/2013 1114   LYMPHSABS 1.1 10/30/2013 1048   MONOABS 0.4 10/30/2013 1048   EOSABS 0.1 10/30/2013 1048   BASOSABS 0.0 10/30/2013 1048    Ferritin    Component Value Date/Time   FERRITIN 55.9 12/12/2013 1114    Will have my nurse inform pt that lab tests show low iron levels.  She needs to start ferrous sulfate 325 mg twice daily with meals.  She should take 100 mg vitamin C twice daily to increase absorption of iron.  She should avoid taking these pills with calcium supplements or dairy products.

## 2013-12-13 NOTE — Telephone Encounter (Signed)
Results have been explained to patient, pt expressed understanding.  [facility]m [facility] pt aware  Nothing further needed.

## 2013-12-14 NOTE — Assessment & Plan Note (Signed)
She is compliant with CPAP and reports benefit form therapy.  She is to continue CPAP 8 cm H2O.

## 2013-12-14 NOTE — Assessment & Plan Note (Addendum)
Likely from COPD and deconditioning.  Will arrange for PFT to further assess.  She was recently evaluated by cardiology and Echo was normal.

## 2013-12-14 NOTE — Assessment & Plan Note (Signed)
She is followed by Dr. Roxan Hockey for this.  She will have f/u CT chest in January 2016.

## 2013-12-14 NOTE — Assessment & Plan Note (Addendum)
Continue spiriva.  Will arrange for PFT to further assess.

## 2013-12-14 NOTE — Assessment & Plan Note (Signed)
She has persistent symptoms.  She is already on relatively high dose of requip.  Will check her labs, including ferritin >> if this is low, then she may benefit from iron supplementation with goal to get ferritin above 75.

## 2013-12-18 ENCOUNTER — Telehealth: Payer: Self-pay | Admitting: Family Medicine

## 2013-12-18 NOTE — Telephone Encounter (Signed)
Pt request referral to Dr. Deatra Ina at [facility]. Pt is having nausea for about 1 month. Pt saw Dr. Deatra Ina a year ago for bleeding ulcers.

## 2013-12-18 NOTE — Telephone Encounter (Signed)
Stephanie [name], does she need a referral since she has already been seen by GI?

## 2013-12-19 ENCOUNTER — Other Ambulatory Visit: Payer: Self-pay

## 2013-12-19 ENCOUNTER — Other Ambulatory Visit: Payer: Self-pay | Admitting: Family Medicine

## 2013-12-19 MED ORDER — ALBUTEROL SULFATE HFA 108 (90 BASE) MCG/ACT IN AERS: INHALATION_SPRAY | RESPIRATORY_TRACT | Status: DC

## 2013-12-19 MED ORDER — TIOTROPIUM BROMIDE MONOHYDRATE 18 MCG IN CAPS: ORAL_CAPSULE | RESPIRATORY_TRACT | Status: DC

## 2013-12-19 NOTE — Telephone Encounter (Signed)
Pt request refill proair inhaler and spiriva; pt advised done.

## 2013-12-20 ENCOUNTER — Telehealth: Payer: Self-pay | Admitting: Pulmonary Disease

## 2013-12-20 DIAGNOSIS — G4733 Obstructive sleep apnea (adult) (pediatric): Secondary | ICD-10-CM

## 2013-12-20 NOTE — Telephone Encounter (Signed)
ATC x 2 line busy wcb

## 2013-12-23 NOTE — Telephone Encounter (Signed)
lmomtcb x1 

## 2013-12-24 ENCOUNTER — Other Ambulatory Visit: Payer: Self-pay | Admitting: Family Medicine

## 2013-12-24 NOTE — Telephone Encounter (Signed)
Spoke with pt. She needs new CPAP mask. Needs this sent to [facility]. i have placed order and staff message sent to [facility]

## 2013-12-24 NOTE — Telephone Encounter (Signed)
Pt has returned call.

## 2013-12-25 NOTE — Telephone Encounter (Signed)
Spoke to pt and informed her Rx has been faxed to requested pharmacy

## 2013-12-25 NOTE — Telephone Encounter (Signed)
Pt requesting medication refill. Last f/u appt 07/2013-CPE with no future appts scheduled. pls advise

## 2013-12-26 ENCOUNTER — Emergency Department: Payer: Self-pay | Admitting: Emergency Medicine

## 2013-12-26 DIAGNOSIS — R0789 Other chest pain: Secondary | ICD-10-CM | POA: Diagnosis not present

## 2013-12-26 DIAGNOSIS — R071 Chest pain on breathing: Secondary | ICD-10-CM | POA: Diagnosis not present

## 2013-12-26 DIAGNOSIS — Z9079 Acquired absence of other genital organ(s): Secondary | ICD-10-CM | POA: Diagnosis not present

## 2013-12-26 DIAGNOSIS — E119 Type 2 diabetes mellitus without complications: Secondary | ICD-10-CM | POA: Diagnosis not present

## 2013-12-26 DIAGNOSIS — R52 Pain, unspecified: Secondary | ICD-10-CM | POA: Diagnosis not present

## 2013-12-26 DIAGNOSIS — J90 Pleural effusion, not elsewhere classified: Secondary | ICD-10-CM | POA: Diagnosis not present

## 2013-12-26 DIAGNOSIS — Z87891 Personal history of nicotine dependence: Secondary | ICD-10-CM | POA: Diagnosis not present

## 2013-12-26 DIAGNOSIS — E785 Hyperlipidemia, unspecified: Secondary | ICD-10-CM | POA: Diagnosis not present

## 2013-12-26 DIAGNOSIS — Z9089 Acquired absence of other organs: Secondary | ICD-10-CM | POA: Diagnosis not present

## 2013-12-26 DIAGNOSIS — J9819 Other pulmonary collapse: Secondary | ICD-10-CM | POA: Diagnosis not present

## 2013-12-26 LAB — URINALYSIS, COMPLETE
BILIRUBIN, UR: NEGATIVE
Bacteria: NONE SEEN
Blood: NEGATIVE
GLUCOSE, UR: NEGATIVE mg/dL (ref 0–75)
Ketone: NEGATIVE
Nitrite: NEGATIVE
Ph: 5 (ref 4.5–8.0)
Protein: NEGATIVE
SPECIFIC GRAVITY: 1.029 (ref 1.003–1.030)

## 2013-12-26 LAB — COMPREHENSIVE METABOLIC PANEL
ALBUMIN: 2.8 g/dL — AB (ref 3.4–5.0)
ALT: 41 U/L
ANION GAP: 6 — AB (ref 7–16)
Alkaline Phosphatase: 151 U/L — ABNORMAL HIGH
BILIRUBIN TOTAL: 0.6 mg/dL (ref 0.2–1.0)
BUN: 13 mg/dL (ref 7–18)
CALCIUM: 8.4 mg/dL — AB (ref 8.5–10.1)
Chloride: 109 mmol/L — ABNORMAL HIGH (ref 98–107)
Co2: 25 mmol/L (ref 21–32)
Creatinine: 0.82 mg/dL (ref 0.60–1.30)
Glucose: 144 mg/dL — ABNORMAL HIGH (ref 65–99)
Osmolality: 282 (ref 275–301)
Potassium: 3.8 mmol/L (ref 3.5–5.1)
SGOT(AST): 50 U/L — ABNORMAL HIGH (ref 15–37)
Sodium: 140 mmol/L (ref 136–145)
Total Protein: 7 g/dL (ref 6.4–8.2)

## 2013-12-26 LAB — LIPASE, BLOOD: Lipase: 82 U/L (ref 73–393)

## 2013-12-26 LAB — CBC WITH DIFFERENTIAL/PLATELET
BASOS PCT: 0.4 %
Basophil #: 0 10*3/uL (ref 0.0–0.1)
EOS PCT: 1 %
Eosinophil #: 0.1 10*3/uL (ref 0.0–0.7)
HCT: 34.4 % — AB (ref 35.0–47.0)
HGB: 11.3 g/dL — AB (ref 12.0–16.0)
LYMPHS ABS: 1 10*3/uL (ref 1.0–3.6)
Lymphocyte %: 13.5 %
MCH: 30.3 pg (ref 26.0–34.0)
MCHC: 32.9 g/dL (ref 32.0–36.0)
MCV: 92 fL (ref 80–100)
Monocyte #: 0.9 x10 3/mm (ref 0.2–0.9)
Monocyte %: 11.7 %
NEUTROS ABS: 5.4 10*3/uL (ref 1.4–6.5)
NEUTROS PCT: 73.4 %
PLATELETS: 129 10*3/uL — AB (ref 150–440)
RBC: 3.74 10*6/uL — ABNORMAL LOW (ref 3.80–5.20)
RDW: 15.2 % — AB (ref 11.5–14.5)
WBC: 7.4 10*3/uL (ref 3.6–11.0)

## 2013-12-26 LAB — MAGNESIUM: MAGNESIUM: 1.8 mg/dL

## 2013-12-26 LAB — TROPONIN I

## 2013-12-26 NOTE — Telephone Encounter (Signed)
Patient doesn't need a referral from [name] and she has an appt with Dr Deatra Ina on 02/27/14 and is also on their wait list for a cancellation if a sooner appt comes up.

## 2013-12-27 ENCOUNTER — Encounter: Payer: Self-pay | Admitting: Family Medicine

## 2013-12-27 ENCOUNTER — Ambulatory Visit (INDEPENDENT_AMBULATORY_CARE_PROVIDER_SITE_OTHER): Payer: Medicare Other | Admitting: Family Medicine

## 2013-12-27 VITALS — BP 112/70 | HR 85 | Temp 98.2°F | Wt 218.8 lb

## 2013-12-27 DIAGNOSIS — R071 Chest pain on breathing: Secondary | ICD-10-CM | POA: Diagnosis not present

## 2013-12-27 DIAGNOSIS — R0789 Other chest pain: Secondary | ICD-10-CM

## 2013-12-27 NOTE — Progress Notes (Signed)
Pre visit review using our clinic review tool, if applicable. No additional management support is needed unless otherwise documented below in the visit note.  Patient had checked with insurance and per patient she could only get meter/strips/lancets covered if checking sugar BID.  rx written for meter with 200 lancets and strips, 3rf on each, check sugar BID.  Rx given to patient.    Seen at [facility] yesterday.  Requesting record. Temp 103 and chest pain the night before ER eval.  Tried to go to sleep, but woke up in sweats.  Pain with a deep breath.  To UC, sent to ER.  CXR and EKG done per patient.  CT done per patient. Started on O2 initially.  Started on zithromax in the meantime.  Not much cough.  No sputum.  Breathing is better today.  Still with pain with a deep breath, but better.  No ear pain.  No rhinorrhea.  Some ST.  No vomiting. Some nausea.  No diarrhea. No rash.  Wasn't admitted overnight.  Last fever was last night, 100 deg.    Meds, vitals, and allergies reviewed.   ROS: See HPI.  Otherwise, noncontributory.  nad ncat Mmm Tm wnl Neck supple, no LA rrr ctab abd soft Chest wall ttp diffusely   Requesting ER records.

## 2013-12-27 NOTE — Patient Instructions (Signed)
Your lungs sound clear and since you are starting to feel some better, then we don't likely to do anything else now.  We'll get your ER records and I'll review those.  Take care.  Glad to see you.

## 2013-12-29 DIAGNOSIS — R0789 Other chest pain: Secondary | ICD-10-CM | POA: Insufficient documentation

## 2013-12-29 NOTE — Assessment & Plan Note (Signed)
Requesting records, with neg w/u per patient, now improved, nontoxic, treated for atypical pathogens.  Okay for outpatient f/u.  She agrees.

## 2013-12-30 ENCOUNTER — Telehealth: Payer: Self-pay | Admitting: Pulmonary Disease

## 2013-12-30 NOTE — Telephone Encounter (Signed)
Noted  

## 2013-12-30 NOTE — Telephone Encounter (Signed)
Called spoke with pt.  Reports she was not able to use her CPAP x 3 days to "lung infection".  Pt reports she is now better and started back using the machine again last night. FYI for Dr. Halford Chessman.

## 2014-01-01 ENCOUNTER — Other Ambulatory Visit: Payer: Self-pay | Admitting: Family Medicine

## 2014-01-01 DIAGNOSIS — F312 Bipolar disorder, current episode manic severe with psychotic features: Secondary | ICD-10-CM | POA: Diagnosis not present

## 2014-01-11 ENCOUNTER — Other Ambulatory Visit: Payer: Self-pay | Admitting: Family Medicine

## 2014-01-13 ENCOUNTER — Ambulatory Visit: Payer: Self-pay | Admitting: Family Medicine

## 2014-01-13 DIAGNOSIS — Z1231 Encounter for screening mammogram for malignant neoplasm of breast: Secondary | ICD-10-CM | POA: Diagnosis not present

## 2014-01-14 ENCOUNTER — Other Ambulatory Visit: Payer: Self-pay | Admitting: Family Medicine

## 2014-01-14 ENCOUNTER — Encounter: Payer: Self-pay | Admitting: Family Medicine

## 2014-01-15 ENCOUNTER — Other Ambulatory Visit: Payer: Self-pay | Admitting: Family Medicine

## 2014-01-15 NOTE — Telephone Encounter (Signed)
Pt requesting medication refill. Last f/u appt 10/2013 with no future appts scheduled. pls advise

## 2014-01-15 NOTE — Telephone Encounter (Signed)
Rx faxed to requested pharmacy

## 2014-01-16 ENCOUNTER — Other Ambulatory Visit: Payer: Self-pay | Admitting: *Deleted

## 2014-01-16 MED ORDER — GLUCOSE BLOOD VI STRP: ORAL_STRIP | Status: DC

## 2014-01-21 ENCOUNTER — Other Ambulatory Visit: Payer: Self-pay | Admitting: Family Medicine

## 2014-01-23 DIAGNOSIS — F312 Bipolar disorder, current episode manic severe with psychotic features: Secondary | ICD-10-CM | POA: Diagnosis not present

## 2014-01-29 ENCOUNTER — Other Ambulatory Visit: Payer: Self-pay | Admitting: Family Medicine

## 2014-01-29 NOTE — Telephone Encounter (Signed)
Pt requesting medication refill. Last f/u appt 10/2013 with no future appts scheduled. pls advise

## 2014-01-29 NOTE — Telephone Encounter (Signed)
Rx faxed to requested pharmacy

## 2014-02-05 ENCOUNTER — Other Ambulatory Visit: Payer: Self-pay | Admitting: Family Medicine

## 2014-02-05 ENCOUNTER — Encounter: Payer: Self-pay | Admitting: Pulmonary Disease

## 2014-02-05 ENCOUNTER — Other Ambulatory Visit: Payer: Self-pay | Admitting: Pulmonary Disease

## 2014-02-07 ENCOUNTER — Ambulatory Visit (INDEPENDENT_AMBULATORY_CARE_PROVIDER_SITE_OTHER): Payer: Medicare Other | Admitting: Pulmonary Disease

## 2014-02-07 ENCOUNTER — Encounter: Payer: Self-pay | Admitting: Pulmonary Disease

## 2014-02-07 VITALS — BP 110/62 | HR 82 | Ht 61.5 in | Wt 215.0 lb

## 2014-02-07 DIAGNOSIS — G2581 Restless legs syndrome: Secondary | ICD-10-CM | POA: Diagnosis not present

## 2014-02-07 DIAGNOSIS — Z23 Encounter for immunization: Secondary | ICD-10-CM

## 2014-02-07 DIAGNOSIS — J45909 Unspecified asthma, uncomplicated: Secondary | ICD-10-CM

## 2014-02-07 DIAGNOSIS — R0609 Other forms of dyspnea: Secondary | ICD-10-CM | POA: Diagnosis not present

## 2014-02-07 DIAGNOSIS — G4733 Obstructive sleep apnea (adult) (pediatric): Secondary | ICD-10-CM | POA: Diagnosis not present

## 2014-02-07 DIAGNOSIS — R911 Solitary pulmonary nodule: Secondary | ICD-10-CM | POA: Diagnosis not present

## 2014-02-07 DIAGNOSIS — R0989 Other specified symptoms and signs involving the circulatory and respiratory systems: Secondary | ICD-10-CM

## 2014-02-07 DIAGNOSIS — R0600 Dyspnea, unspecified: Secondary | ICD-10-CM

## 2014-02-07 LAB — PULMONARY FUNCTION TEST
DL/VA % pred: 107 %
DL/VA: 4.79 ml/min/mmHg/L
DLCO UNC: 15.76 ml/min/mmHg
DLCO unc % pred: 75 %
FEF 25-75 Post: 3.58 L/sec
FEF 25-75 Pre: 2.24 L/sec
FEF2575-%CHANGE-POST: 60 %
FEF2575-%Pred-Post: 174 %
FEF2575-%Pred-Pre: 108 %
FEV1-%Change-Post: 22 %
FEV1-%PRED-POST: 79 %
FEV1-%PRED-PRE: 64 %
FEV1-PRE: 1.45 L
FEV1-Post: 1.77 L
FEV1FVC-%CHANGE-POST: 2 %
FEV1FVC-%PRED-PRE: 114 %
FEV6-%Change-Post: 19 %
FEV6-%Pred-Post: 69 %
FEV6-%Pred-Pre: 58 %
FEV6-PRE: 1.63 L
FEV6-Post: 1.94 L
FEV6FVC-%PRED-PRE: 104 %
FEV6FVC-%Pred-Post: 104 %
FVC-%Change-Post: 18 %
FVC-%Pred-Post: 66 %
FVC-%Pred-Pre: 56 %
FVC-POST: 1.94 L
FVC-Pre: 1.64 L
POST FEV1/FVC RATIO: 91 %
POST FEV6/FVC RATIO: 100 %
PRE FEV6/FVC RATIO: 100 %
Pre FEV1/FVC ratio: 88 %
RV % PRED: 80 %
RV: 1.55 L
TLC % PRED: 77 %
TLC: 3.62 L

## 2014-02-07 MED ORDER — MOMETASONE FURO-FORMOTEROL FUM 100-5 MCG/ACT IN AERO: 2.0000 | INHALATION_SPRAY | Freq: Two times a day (BID) | RESPIRATORY_TRACT | Status: DC

## 2014-02-07 NOTE — Progress Notes (Signed)
Chief Complaint  Patient presents with  . Follow-up    Review PFT. Pt reports no change in dyspnea    History of Present Illness: Stephanie [name] is a 63 y.o. female former smoker with OSA, dyspnea with asthma, RLS and lung nodule followed by Dr. Roxan Hockey.  Her breathing is about the same.  She gets occasional cough, and sputum.  She is doing okay with usual activity.  She denies chest pain.  She is not having any difficulty with CPAP.  She reports her leg symptoms have improved since starting ferrous sulfate.  Her PFT earlier today showed mild restriction and diffusion defect likely related to her body habitus, and positive bronchodilator response.  She did not have evidence for obstruction on spirometry.  TESTS: CT chest 06/22/11 >> focal GGO Rt apex 7 mm, 3 mm nodule LLL CT chest 07/02/13 >> 8 mm GGO nodule Rt apex, 3 mm nodule LLL PSG 08/13/13 >> REM AHI 27.9, SaO2 low 82%, Spent 15.1 min with SpO2 < 90%. CPAP 09/06/13 >> CPAP 8 >> AHI 0, + R, + S. PSG 10/18/13 >> AHI 6.9, SaO2 low 82%. Spent 139.2 min with SaO2 < 89%. ONO with CPAP and RA 11/11/13 >> Test time 8 hrs 59 min. Mean SpO2 92%, low SpO2 81%. Spent 9 min with SpO2 < 88%. Auto CPAP 11/04/13 to 12/03/13 >> used on 29 of 30 nights with average 6 hrs and 41 min.  Average AHI is 0.9 with CPAP 8 cm H2O. Echo 11/14/13 >> EF 55 to 60% Ferritin 12/13/13 >> 55.9; started ferrous sulfate PFT 02/07/14 >> FEV1 1.94 (66%), FEV1% 91, TLC 3.62 (77%), DLCO 75%, +BD  PMHx, PSHx, Medications, Allergies, Fhx, Shx reviewed.  Physical Exam:  General - No distress ENT - No sinus tenderness, no oral exudate, no LAN Cardiac - s1s2 regular, no murmur Chest - No wheeze/rales/dullness Back - No focal tenderness Abd - Soft, non-tender Ext - No edema Neuro - Normal strength Skin - No rashes Psych - normal mood, and behavior   Assessment/Plan:  Chesley Mires, MD [facility]/Sleep Pager:  [phone]

## 2014-02-07 NOTE — Patient Instructions (Signed)
Stop spiriva Dulera two puffs twice per day >> rinse mouth after each use Proair two puffs up to four times per day as needed for cough, wheeze, or chest congestion Follow up in 6 weeks

## 2014-02-07 NOTE — Progress Notes (Signed)
PFT done today. 

## 2014-02-09 ENCOUNTER — Encounter: Payer: Self-pay | Admitting: Pulmonary Disease

## 2014-02-09 NOTE — Assessment & Plan Note (Signed)
She is followed by Dr. Roxan Hockey for this.  She will have f/u CT chest in January 2016.

## 2014-02-09 NOTE — Assessment & Plan Note (Addendum)
She is compliant with CPAP and reports benefit form therapy.  She is to continue CPAP 8 cm H2O.

## 2014-02-09 NOTE — Assessment & Plan Note (Signed)
Her PFT's were not consistent with COPD, but more suggestive of asthma.  She continues to have productive cough.  Will change her from spiriva to dulera.

## 2014-02-09 NOTE — Assessment & Plan Note (Signed)
Her leg symptoms have improved since starting ferrous sulfate.  Will repeat her ferritin level at next visit, and if above 75 will plane to d/c ferrous sulfate.

## 2014-02-11 ENCOUNTER — Encounter: Payer: Self-pay | Admitting: Pulmonary Disease

## 2014-02-12 ENCOUNTER — Other Ambulatory Visit: Payer: Self-pay | Admitting: Family Medicine

## 2014-02-14 ENCOUNTER — Other Ambulatory Visit: Payer: Self-pay | Admitting: Family Medicine

## 2014-02-14 NOTE — Telephone Encounter (Signed)
Ok to fill per Dr Deborra Medina. Rx faxed to requested pharmacy

## 2014-02-15 ENCOUNTER — Emergency Department: Payer: Self-pay | Admitting: Emergency Medicine

## 2014-02-15 DIAGNOSIS — Z79899 Other long term (current) drug therapy: Secondary | ICD-10-CM | POA: Diagnosis not present

## 2014-02-15 DIAGNOSIS — Z87891 Personal history of nicotine dependence: Secondary | ICD-10-CM | POA: Diagnosis not present

## 2014-02-15 DIAGNOSIS — R079 Chest pain, unspecified: Secondary | ICD-10-CM | POA: Diagnosis not present

## 2014-02-15 DIAGNOSIS — J9819 Other pulmonary collapse: Secondary | ICD-10-CM | POA: Diagnosis not present

## 2014-02-15 DIAGNOSIS — E119 Type 2 diabetes mellitus without complications: Secondary | ICD-10-CM | POA: Diagnosis not present

## 2014-02-15 DIAGNOSIS — R110 Nausea: Secondary | ICD-10-CM | POA: Diagnosis not present

## 2014-02-15 DIAGNOSIS — R51 Headache: Secondary | ICD-10-CM | POA: Diagnosis not present

## 2014-02-15 DIAGNOSIS — R071 Chest pain on breathing: Secondary | ICD-10-CM | POA: Diagnosis not present

## 2014-02-15 DIAGNOSIS — R002 Palpitations: Secondary | ICD-10-CM | POA: Diagnosis not present

## 2014-02-15 LAB — BASIC METABOLIC PANEL
Anion Gap: 10 (ref 7–16)
BUN: 16 mg/dL (ref 7–18)
CHLORIDE: 108 mmol/L — AB (ref 98–107)
CREATININE: 0.91 mg/dL (ref 0.60–1.30)
Calcium, Total: 8.4 mg/dL — ABNORMAL LOW (ref 8.5–10.1)
Co2: 22 mmol/L (ref 21–32)
EGFR (African American): >60
EGFR (Non-African Amer.): >60
Glucose: 159 mg/dL — ABNORMAL HIGH (ref 65–99)
Osmolality: 284 (ref 275–301)
Potassium: 4 mmol/L (ref 3.5–5.1)
Sodium: 140 mmol/L (ref 136–145)

## 2014-02-15 LAB — CBC
HCT: 39.8 % (ref 35.0–47.0)
HGB: 13.4 g/dL (ref 12.0–16.0)
MCH: 31.1 pg (ref 26.0–34.0)
MCHC: 33.6 g/dL (ref 32.0–36.0)
MCV: 92 fL (ref 80–100)
PLATELETS: 128 10*3/uL — AB (ref 150–440)
RBC: 4.31 10*6/uL (ref 3.80–5.20)
RDW: 15.1 % — AB (ref 11.5–14.5)
WBC: 8.5 10*3/uL (ref 3.6–11.0)

## 2014-02-15 LAB — TROPONIN I

## 2014-02-15 LAB — D-DIMER(ARMC): D-Dimer: 683 ng/ml

## 2014-02-18 ENCOUNTER — Telehealth: Payer: Self-pay | Admitting: Pulmonary Disease

## 2014-02-18 NOTE — Telephone Encounter (Signed)
LMTCB

## 2014-02-18 NOTE — Telephone Encounter (Signed)
Spoke with the pt  Last seen 9/4 and started on Dulera Took med for 4 days and then stopped due to severe sore throat- despite rinsing her mouth  She states that she woke up on 02/14/14 am with severe SOB and pain in chest upon inspiration  Went to [facility] ED and they did CTA- no PE but "esophagus very thin and narrow"- per pt  She was advised she had an asthma attack  Breathing back to baseline  Please advise thanks!

## 2014-02-18 NOTE — Telephone Encounter (Signed)
ATC patient-no answer and no voicemail set up. Will nee to try again in AM

## 2014-02-18 NOTE — Telephone Encounter (Signed)
She needs to speak to her PCP about having a "thin esophagus".  She can stop dulera.  She can try using Breo one puff daily >>> please have her get a sample to try first, and if she tolerates this then send in script.

## 2014-02-19 MED ORDER — FLUTICASONE FUROATE-VILANTEROL 100-25 MCG/INH IN AEPB: 1.0000 | INHALATION_SPRAY | Freq: Every day | RESPIRATORY_TRACT | Status: DC

## 2014-02-19 NOTE — Telephone Encounter (Signed)
Pt returning call.Stephanie [name] ° °

## 2014-02-19 NOTE — Telephone Encounter (Signed)
Pt returned call.  Spoke with patient and advised of VS's recommendations.  Pt voiced her understanding and will contact her PCP regarding a "thin esophagus."  Pt is okay with changing the Dulera to [facility] but does not want to come to [place] to pick up a sample.  She instead is asking to have a script sent (she stated she does not have an rx copay) and will try the prescription and call the office with update.  Advised pt to please ask her pharmacist to show her how to use the inhaler when she picks this up to ensure she is using it properly.  Pt voiced her understanding in this as well.  Med list updated. Nothing further needed at this time; will sign off.

## 2014-02-19 NOTE — Telephone Encounter (Signed)
lmomtcb for pt 

## 2014-02-20 DIAGNOSIS — F312 Bipolar disorder, current episode manic severe with psychotic features: Secondary | ICD-10-CM | POA: Diagnosis not present

## 2014-02-20 IMAGING — CT CT CHEST W/O CM
3 of 4 series · 16 of 30 positions shown, 17 images · non-contrast
Comparison: 06/26/12

CLINICAL DATA: Left lung nodule follow up, former smoker

EXAM:
CT CHEST WITHOUT CONTRAST
TECHNIQUE: Multidetector CT imaging of the chest was performed following the
standard protocol without IV contrast.

[Series 3: chest w/o · axial · non-contrast · 0.70mm/px · z∈[-211,-41]mm · 4 of 58 slices shown, 5 images]
[im 12/58  mediastinal]
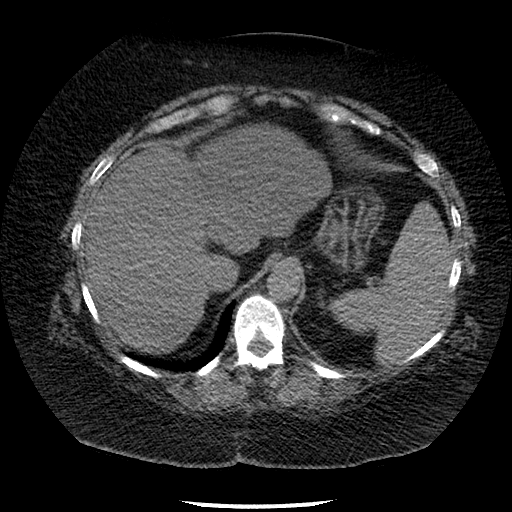
[im 12/58  lung]
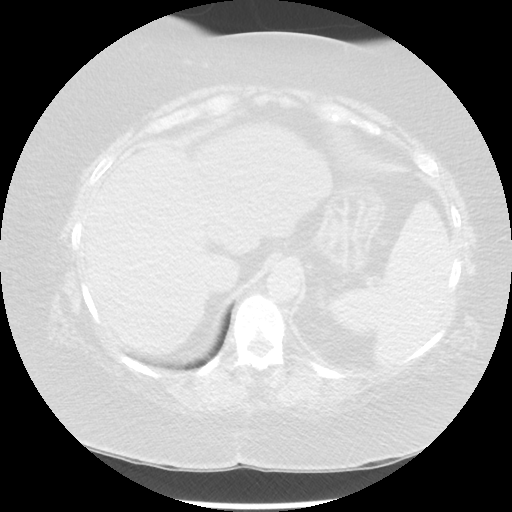
[im 23/58  lung]
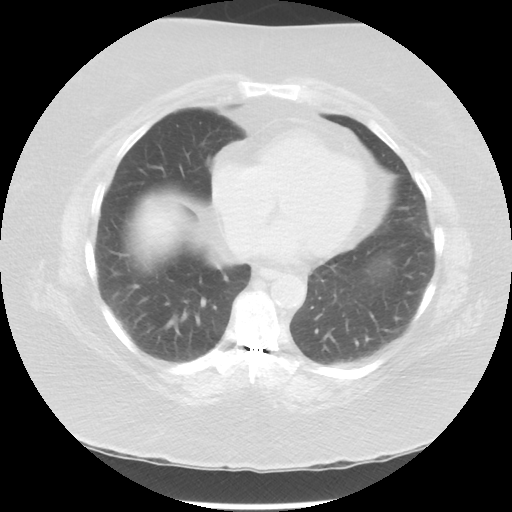
[im 35/58  lung]
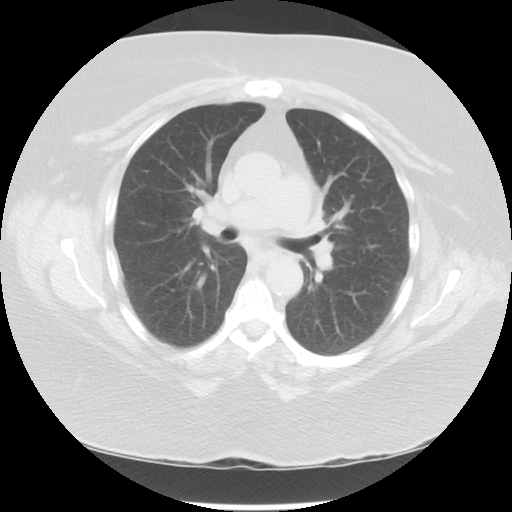
[im 46/58  lung]
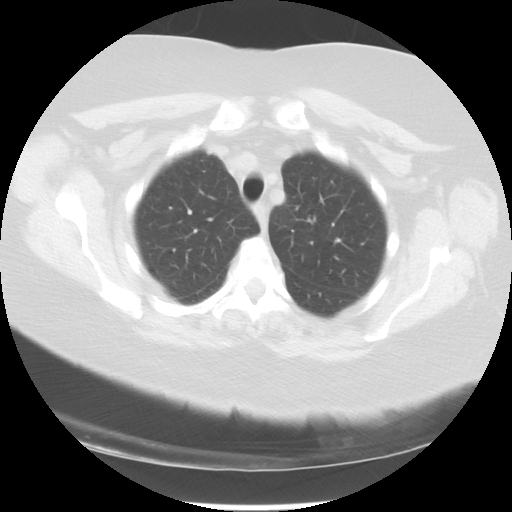

[Series 4: lung windows · axial · 0.70mm/px · z∈[-211,-41]mm · 4 of 58 slices shown]
[im 12/58  lung]
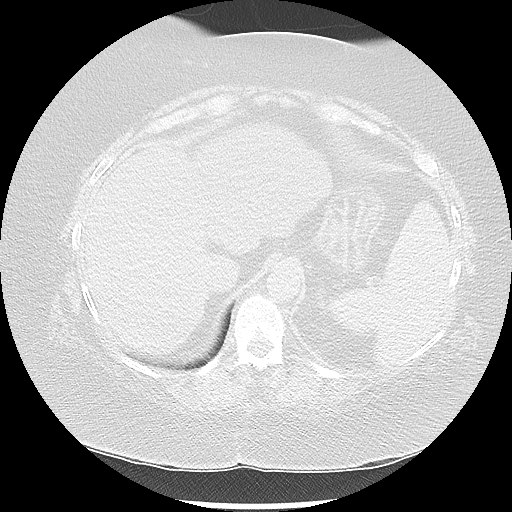
[im 23/58  lung]
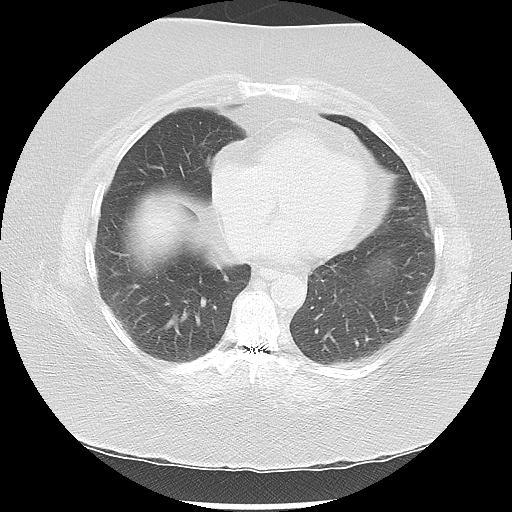
[im 35/58  lung]
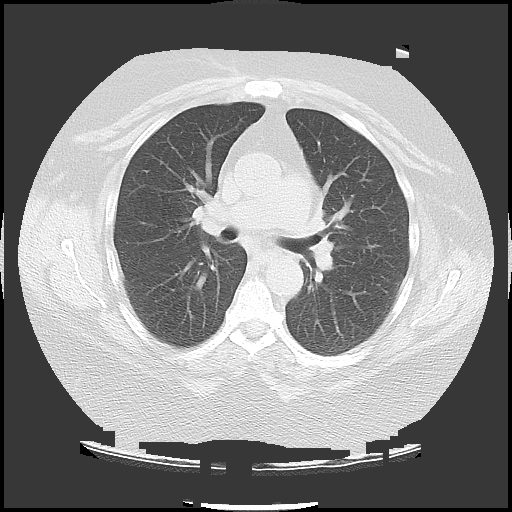
[im 46/58  lung]
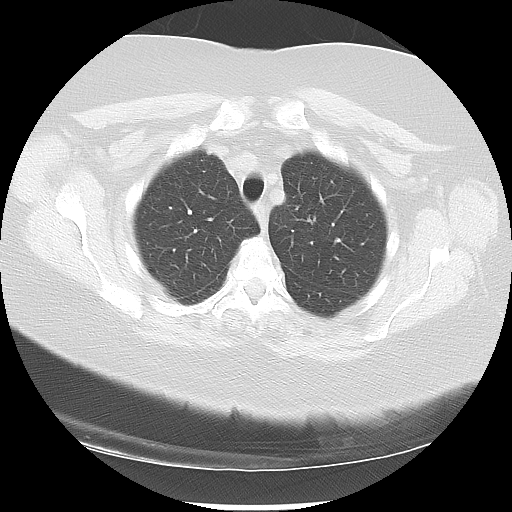

[Series 602: sagittal body · sagittal · 0.70mm/px · 8 of 145 slices shown]
[im 10/145  mediastinal]
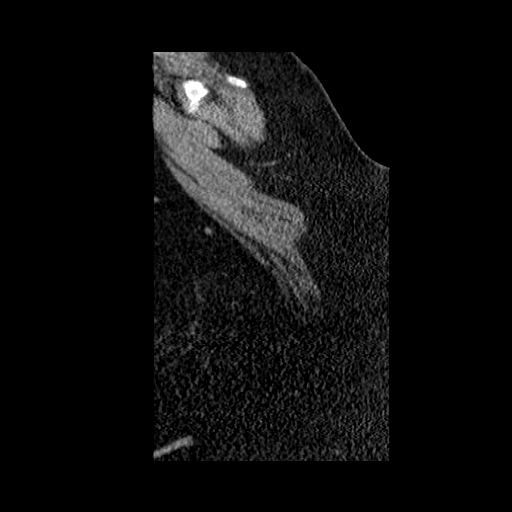
[im 29/145  mediastinal]
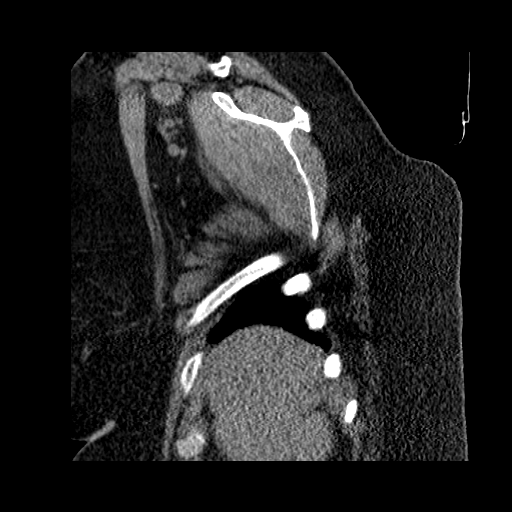
[im 49/145  mediastinal]
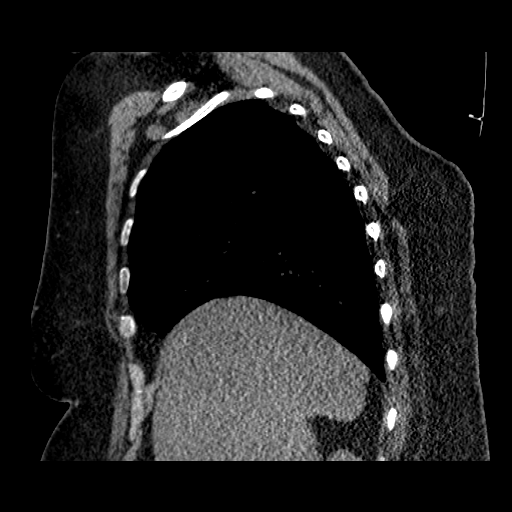
[im 68/145  mediastinal]
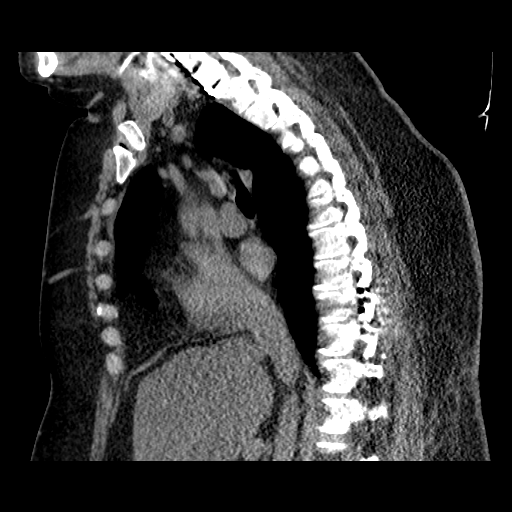
[im 77/145  mediastinal]
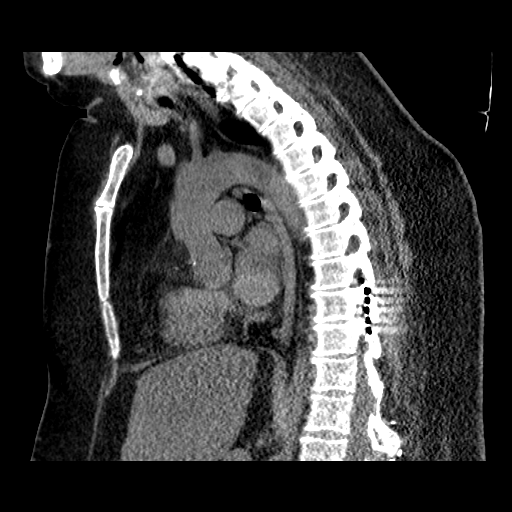
[im 97/145  mediastinal]
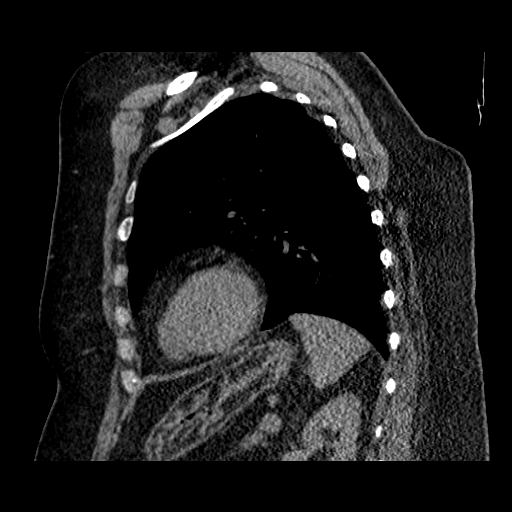
[im 116/145  mediastinal]
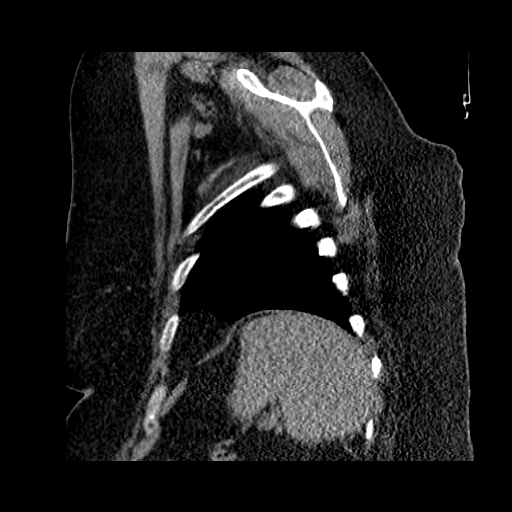
[im 135/145  mediastinal]
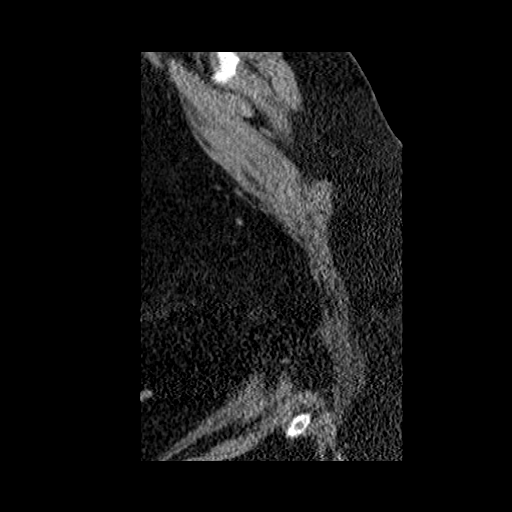

[16 of 30 positions shown; findings below may reference images not displayed]

FINDINGS: 8 mm hazy nodule right apex, image number 9, stable and likely
reflective of scarring. Right lung otherwise clear. 3 mm pulmonary
nodule left lower lobe image number 32, stable. No other significant
findings on the left.

Focal 1cm mucosal convexity in the region of the left piriform
sinus, new from prior study, of uncertain significance. No
significant adenopathy. No pleural effusions. Scans through the
upper abdomen unremarkable. No acute musculoskeletal findings.
IMPRESSION: 1. Ground-glass opacity right lung apex again measuring 8 mm,
unchanged from 2531, and minimally enlarged when compared to 9032
and 3943. Continued follow-up recommended. Consider CT thorax in 1
year.
2. Stable noncalcified 3 mm left lower lobe pulmonary nodule. This
finding has been stable for over 2 years and can be considered to be
benign.
3. Nonspecific mucosal asymmetry in the region of the left piriform
sinus. This is nonspecific, but mucosal lesion not excluded.
Consider targeted examination or imaging with contrast-enhanced CT
of the neck.
These results will be called to the ordering clinician or
representative by the Radiologist Assistant, and communication
documented in the PACS Dashboard.

## 2014-02-21 ENCOUNTER — Telehealth: Payer: Self-pay | Admitting: Pulmonary Disease

## 2014-02-21 ENCOUNTER — Encounter: Payer: Self-pay | Admitting: Family Medicine

## 2014-02-21 ENCOUNTER — Ambulatory Visit (INDEPENDENT_AMBULATORY_CARE_PROVIDER_SITE_OTHER): Payer: Medicare Other | Admitting: Family Medicine

## 2014-02-21 ENCOUNTER — Other Ambulatory Visit: Payer: Self-pay | Admitting: Family Medicine

## 2014-02-21 VITALS — BP 136/74 | HR 77 | Temp 98.2°F | Wt 223.2 lb

## 2014-02-21 DIAGNOSIS — R9389 Abnormal findings on diagnostic imaging of other specified body structures: Secondary | ICD-10-CM | POA: Diagnosis not present

## 2014-02-21 MED ORDER — BUDESONIDE-FORMOTEROL FUMARATE 160-4.5 MCG/ACT IN AERO
2.0000 | INHALATION_SPRAY | Freq: Two times a day (BID) | RESPIRATORY_TRACT | Status: DC
Start: 2014-02-21 — End: 2014-09-24

## 2014-02-21 NOTE — Progress Notes (Signed)
Subjective:   Patient ID: Stephanie [name], female    DOB: 1950/10/02, 63 y.o.   MRN: [id number]  Stephanie [name] is a pleasant 68 y.o. year old female with complicated medical history, including diabetes, cirrhosis, ascites, COPD, sleep apnea, and bipolar disorder  who presents to clinic today with Hospitalization Follow-up  on 02/21/2014  HPI: Here for ?thin esophagus  Was seen by Dr. Halford Chessman, pulmonologist, on 9/4- started on Dulera.   4 days later, stopped it due to sore throat despite rinsing her mouth.   Woke up on 02/14/14 with severe SOB and CP with inspiration and went to [facility].  Notes reviewed-  D dimer elevated CTA neg for PE, report states " some secretions layering dependently within the trachea.  Question if she is at risk for aspiration." CMET unremarkable Discharge diagnosis- COPD exacerbation.  Was told her esophagus was thin.  Called Dr. Halford Chessman upon discharge and he told her to come see me about this.  He also told her to try breo one puff per day.  Sees Dr. Deatra Ina (GI)- last endoscopy normal on 03/27/13- advised yearly capsule endoscopies.  CP has since resolved.  Current Outpatient Prescriptions on File Prior to Visit  Medication Sig Dispense Refill  . ABILIFY 30 MG tablet TAKE 1 TABLET BY MOUTH EVERY MORNING  30 tablet  0  . albuterol (PROAIR HFA) 108 (90 BASE) MCG/ACT inhaler INHALE 2 PUFFS INTO THE LUNGS EVERY 6 (SIX) HOURS AS NEEDED FOR WHEEZING.  8.5 each  3  . ALORA 0.1 MG/24HR patch PLACE 1 PATCH (0.1 MG TOTAL) ONTO THE SKIN 2 (TWO) TIMES A WEEK.  8 patch  3  . clonazePAM (KLONOPIN) 0.5 MG tablet TAKE 1 TABET BY MOUTH 2 TIMES A DAY AS NEEDED FOR ANXIETY  60 tablet  0  . [facility] STOOL SOFTENER 100 MG capsule TAKE 1 CAPSULE BY MOUTH 2 TIMES DAILY AS NEEDED FOR CONSTIPATION  60 capsule  3  . diazepam (VALIUM) 5 MG tablet TAKE 1 TABLET BY MOUTH 2 TIMES A DAY AS NEEDED FOR MUSCLE SPASMS  60 tablet  0  . ferrous sulfate 325 (65 FE) MG tablet TAKE 1 TABLET (325 MG TOTAL)  BY MOUTH 2 (TWO) TIMES DAILY WITH A MEAL.  60 tablet  1  . Fluticasone Furoate-Vilanterol (BREO ELLIPTA) 100-25 MCG/INH AEPB Inhale 1 puff into the lungs daily.  60 each  3  . glucose blood (ONE TOUCH ULTRA TEST) test strip USE AS DIRECTED TWICE DAILY Dx 250.52  100 each  0  . lamoTRIgine (LAMICTAL) 100 MG tablet Take 3 tablets by mouth once daily      . lidocaine (LIDODERM) 5 % PLACE 1 PATCH ONTO THE SKIN DAILY. REMOVE & DISCARD PATCH WITHIN 12 HOURS OR AS DIRECTED BY MD  30 patch  0  . metFORMIN (GLUCOPHAGE) 1000 MG tablet Take 0.5 tablets (500 mg total) by mouth 2 (two) times daily with a meal.  180 tablet  3  . mirtazapine (REMERON) 15 MG tablet TAKE 1 TABLET BY MOUTH AT BEDTIME  34 tablet  2  . nystatin (MYCOSTATIN/NYSTOP) 100000 UNIT/GM POWD APPLY TOPICALLY 2 TIMES DAILY AS DIRECTED  60 g  0  . nystatin-triamcinolone ointment (MYCOLOG) APPLY 1 APPLICATION TOPICALLY 2 (TWO) TIMES DAILY.  30 g  0  . omeprazole (PRILOSEC) 40 MG capsule TAKE 1 CAPSULE BY MOUTH DAILY.  90 capsule  1  . ONETOUCH DELICA LANCETS FINE MISC USE AS DIRECTED TWICE DAILY  100 each  0  .  promethazine (PHENERGAN) 25 MG tablet TAKE 1/2 TO 1 TABLET BY MOUTH EVERY 8 HOURS AS NEEDED FOR NAUSEA  30 tablet  2  . rOPINIRole (REQUIP) 3 MG tablet TAKE 1 TABLET BY MOUTH AT BEDTIME  90 tablet  0  . salicylic acid 6 % gel APPLY TO AFFECTED AREA EVERY DAY *USE AT NIGHT AND RINSE OFF IN THE MORNING*  40 g  0  . simvastatin (ZOCOR) 10 MG tablet Take 1 tablet (10 mg total) by mouth at bedtime.  90 tablet  3  . tolterodine (DETROL LA) 2 MG 24 hr capsule TAKE ONE CAPSULE BY MOUTH DAILY  90 capsule  1  . traMADol (ULTRAM) 50 MG tablet TAKE 2 TABLETS BY MOUTH EVERY MORNING AND EVENING AS NEEDED  120 tablet  0  . urea (CARMOL) 40 % CREA Apply nightly to feet      . vitamin D, CHOLECALCIFEROL, 400 UNITS tablet Take two by mouth daily  6 tablet  6  . zolpidem (AMBIEN) 5 MG tablet TAKE 1/2 TO 1 TABLET BY MOUTH AT BEDTIME AS NEEDED  30 tablet  0     No current facility-administered medications on file prior to visit.    No Known Allergies  Past Medical History  Diagnosis Date  . Adenomatous colon polyp   . Insomnia   . RLS (restless legs syndrome)   . Colon polyp   . Incontinence   . Hepatitis B, chronic   . H/O: rheumatic fever   . Hyperlipidemia   . Unspecified disorders of nervous system   . Bipolar affective disorder     h/o  . H/O: CVA (cardiovascular accident)     TIA  . Fatty liver 04/09/08    found in abd CT  . Shortness of breath   . Obstructive sleep apnea     not using CPAP, last sleep study 2.5 years ago, per pt doctor is aware  . Blood transfusion 2000  . GERD (gastroesophageal reflux disease)   . Arthritis   . Aortic dissection     Type 1  . Asthma   . Anxiety   . Depression   . Heart murmur     as a child  . Stroke     TIA- 2002  . Ulcer   . Diabetes mellitus without complication     Past Surgical History  Procedure Laterality Date  . Back surgery      x 45  . Cholecystectomy    . Rotator cuff repair      left  . Abdominal hysterectomy      total  . Lumbar fusion  10/09  . Hemorroidectomy      and colon polyp removed  . Abdominal exploration surgery    . Posterior cervical fusion/foraminotomy    . Eye surgery      bilat  . Lumbar wound debridement  05/09/2011    Procedure: LUMBAR WOUND DEBRIDEMENT;  Surgeon: Dahlia Bailiff;  Location: [facility];  Service: Orthopedics;  Laterality: N/A;  IRRIGATION AND DEBRIDEMENT SPINAL WOUND  . Axillary artery cannulation via 8-mm hemashield graft, median sternotomy, extracorporeal circulation with deep hypothermic circulatory arrest, repair of  aortic dessection  01/23/2009    Dr Arlyce Dice  . Cataract      both eyes  . Colonoscopy    . Liver biopsy  04/10/2012    Procedure: LIVER BIOPSY;  Surgeon: Inda Castle, MD;  Location: [facility];  Service: Endoscopy;  Laterality: N/A;  ultrasound to mark  order for abd limited/liver to be marked to be sent  by [email or web address]     Family History  Problem Relation Age of Onset  . Colon cancer Neg Hx   . Esophageal cancer Neg Hx   . Rectal cancer Neg Hx   . Breast cancer Mother   . Lung cancer Mother   . Stomach cancer Father   . Diabetes      4 aunts, and 1 uncle    History   Social History  . Marital Status: Divorced    Spouse Name: N/A    Number of Children: 1  . Years of Education: N/A   Occupational History  . Disabled    Social History Main Topics  . Smoking status: Former Smoker -- 0.50 packs/day for 50 years    Types: Cigarettes    Quit date: 04/06/2013  . Smokeless tobacco: Never Used  . Alcohol Use: No  . Drug Use: No  . Sexual Activity: Not on file   Other Topics Concern  . Not on file   Social History Narrative   1 son, 74+ y/o   Daily caffeine use: 2 cups daily   Does not get regular exercise   Disability due to bipolar      Has a living will- she is a DNR (she has form at home per pt).   The PMH, PSH, Social History, Family History, Medications, and allergies have been reviewed in [facility], and have been updated if relevant.     Review of Systems    See hPI No CP Persistent SOB- no worse than prior Objective:    BP 136/74  Pulse 77  Temp(Src) 98.2 F (36.8 C) (Oral)  Wt 223 lb 4 oz (101.266 kg)  SpO2 96%   Physical Exam  Gen: alert, pleasant, NAD Psych:  Good eye contact      Assessment & Plan:   Abnormal chest CT No Follow-up on file.

## 2014-02-21 NOTE — Telephone Encounter (Signed)
Okay to send script for symbicort 160/4.5 two puffs bid, dispense one inhaler with 3 refills.  Please inform pt that change from breo is mandated by her insurance provider.

## 2014-02-21 NOTE — Telephone Encounter (Signed)
Pt returned call [phone]

## 2014-02-21 NOTE — Assessment & Plan Note (Signed)
>  25 minutes spent in face to face time with patient, >50% spent in counselling or coordination of care Explained to her that CT was not referring to her esophagus but her trachea.  She is following up with GI- Dr. Deatra Ina next week but I suggested she follow up with Dr. Halford Chessman as well. The patient indicates understanding of these issues and agrees with the plan.

## 2014-02-21 NOTE — Telephone Encounter (Signed)
I spoke with [facility]-they state that Insurance will cover Symbicort. VS please advise if you are okay with giving this Rx instead. Thanks.

## 2014-02-21 NOTE — Telephone Encounter (Signed)
Spoke with patient-she is aware of change in inhalers per insurance and Rx sent to pharmacy. Nothing more needed at this time.

## 2014-02-21 NOTE — Telephone Encounter (Signed)
LMTCB-need to let patient know of change prior to sending Rx. Need to make sure patient is aware of dosing and how to use the inhaler.

## 2014-02-21 NOTE — Progress Notes (Signed)
Pre visit review using our clinic review tool, if applicable. No additional management support is needed unless otherwise documented below in the visit note. 

## 2014-02-23 IMAGING — CT CT NECK WITH CONTRAST
4 of 5 series · 15 of 33 positions shown, 17 images · IV contrast (isovue)
Comparison: Chest CT 04/03/2013

CLINICAL DATA: Hoarseness.  Piriform sinus mass.

EXAM:
CT NECK WITH CONTRAST
TECHNIQUE: Multidetector CT imaging of the neck was performed using the
standard protocol following the bolus administration of intravenous
contrast.
CONTRAST:  75 cc Isovue 370

[Series 3: axial neck · axial · 0.36mm/px · z∈[-294,-174]mm · 4 of 102 slices shown, 5 images]
[im 21/102  soft-tissue]
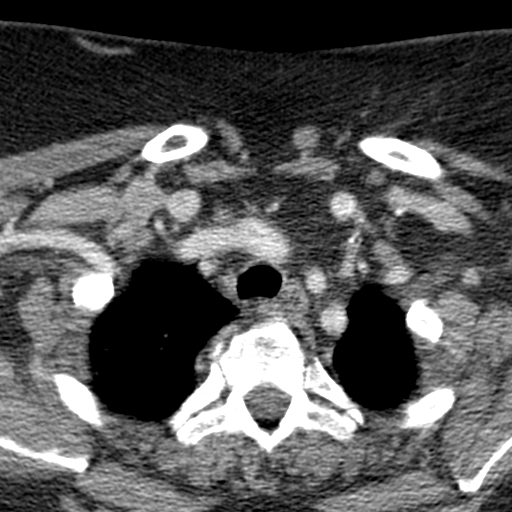
[im 21/102  bone]
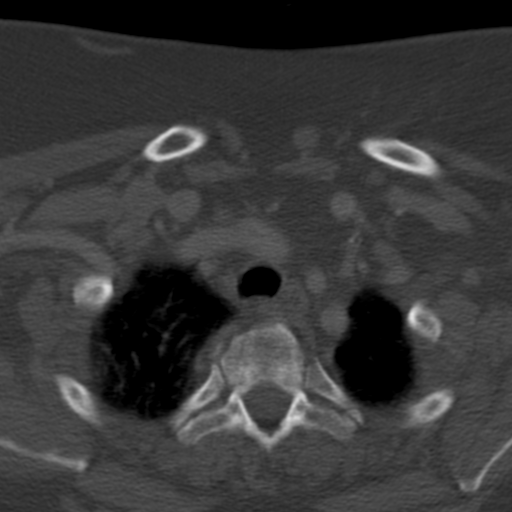
[im 41/102  bone]
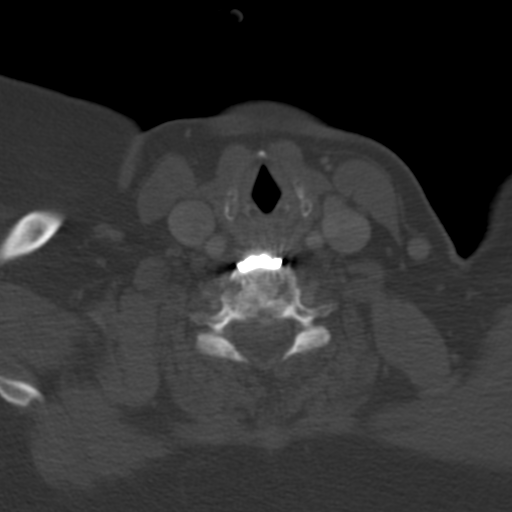
[im 61/102  bone]
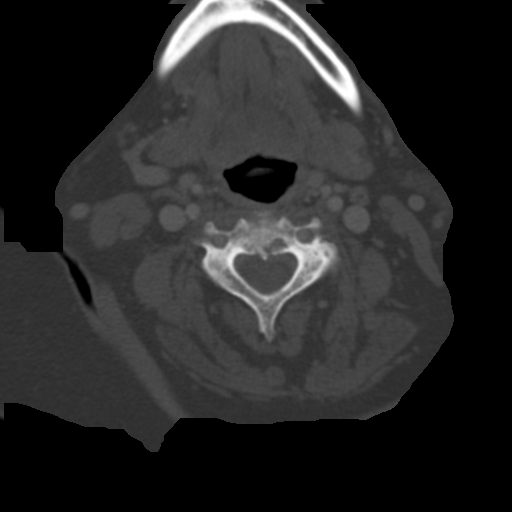
[im 81/102  bone]
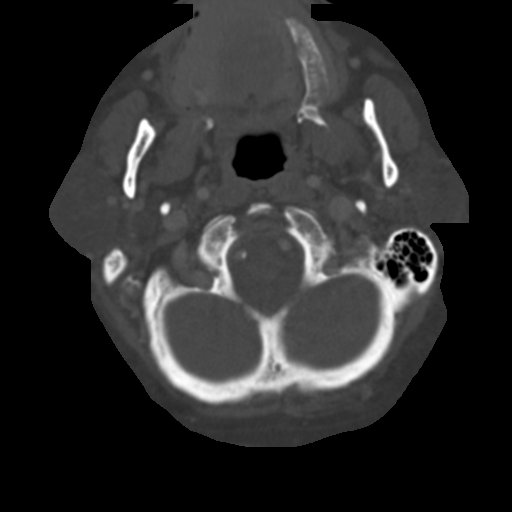

[Series 5: sag neck · sagittal · 0.35mm/px · 5 of 95 slices shown, 6 images]
[im 32/95  bone]
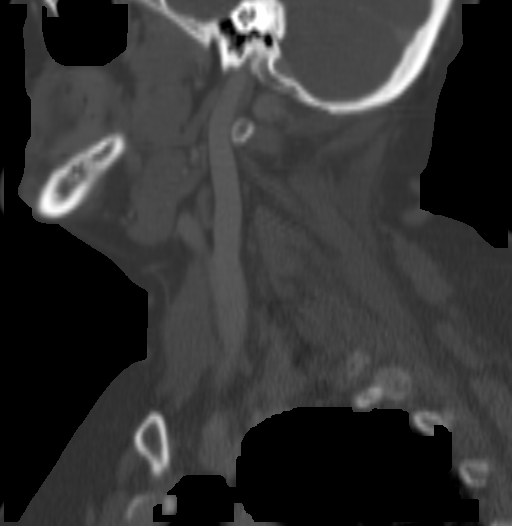
[im 40/95  bone]
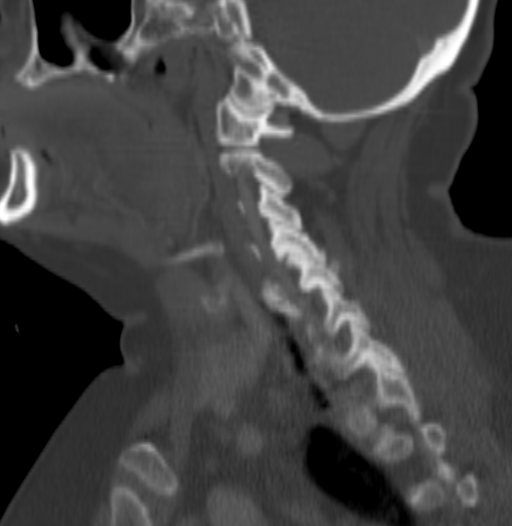
[im 48/95  soft-tissue]
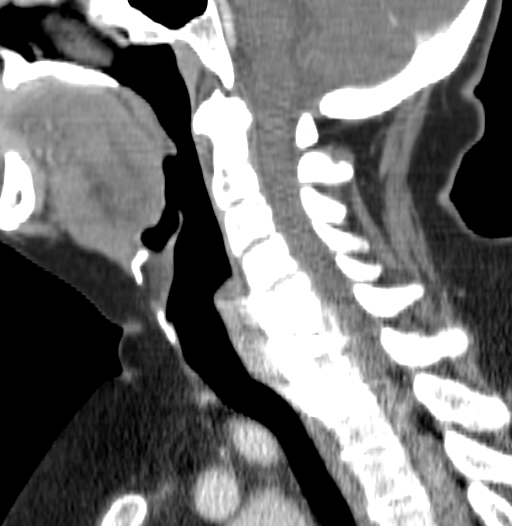
[im 48/95  bone]
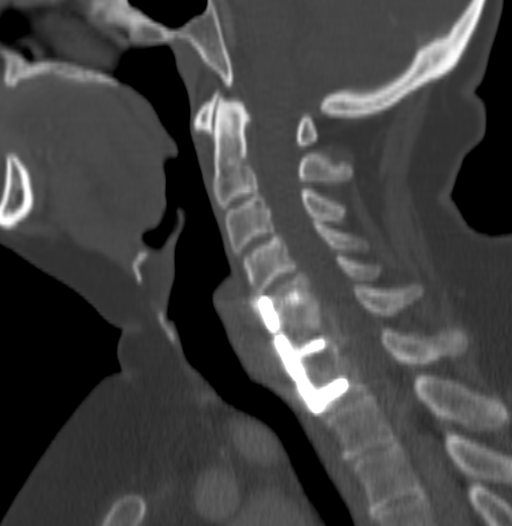
[im 55/95  bone]
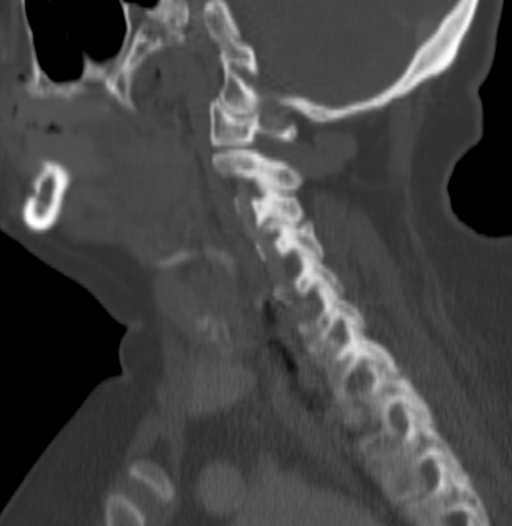
[im 63/95  bone]
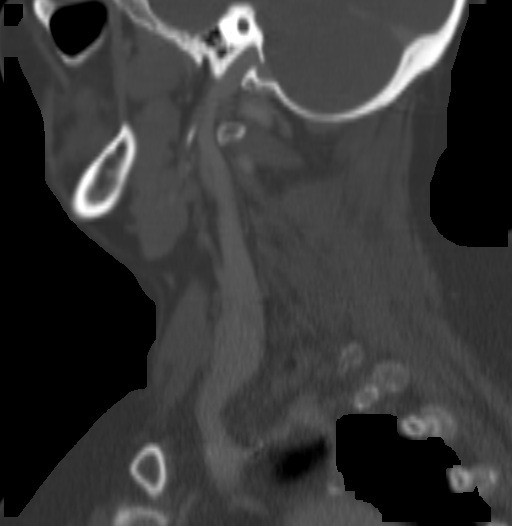

[Series 6: cor neck · coronal · 0.45mm/px · 3 of 93 slices shown]
[im 19/93  bone]
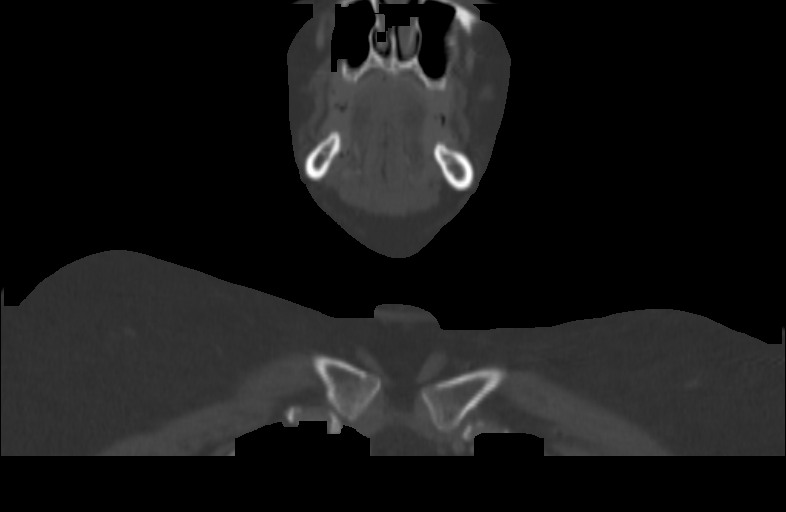
[im 37/93  bone]
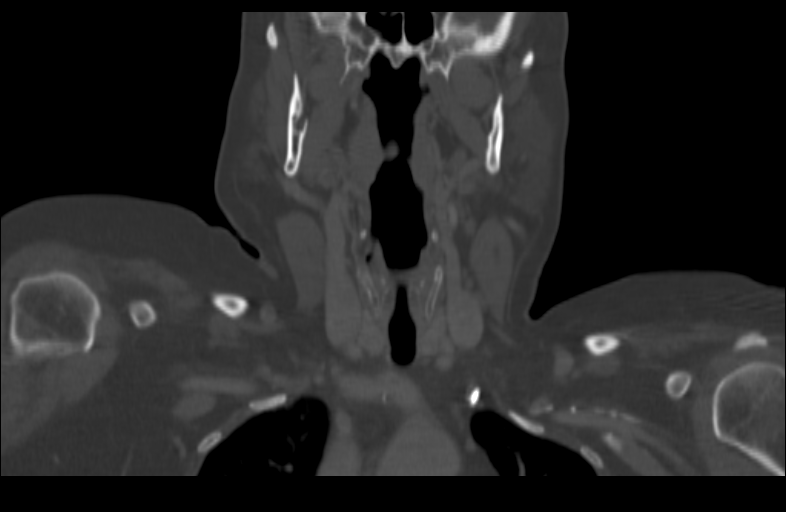
[im 56/93  bone]
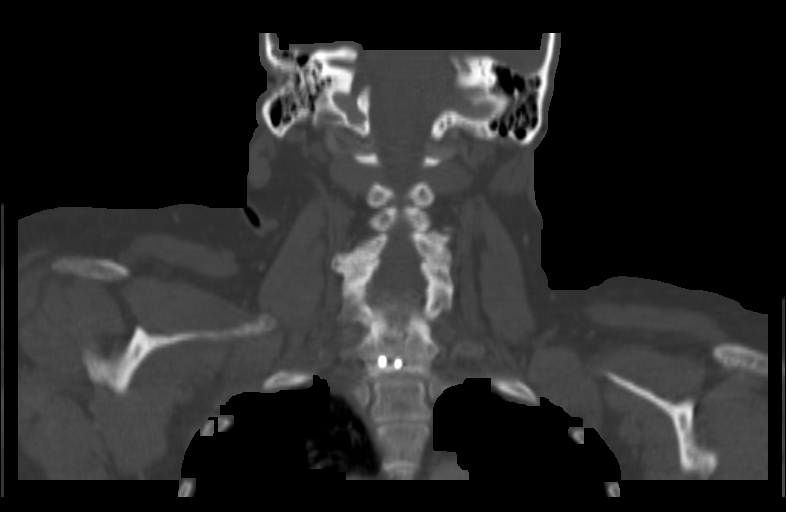

[Series 7: ax oropharynx · axial · 0.36mm/px · z∈[-322,-236]mm · 3 of 110 slices shown]
[im 22/110  bone]
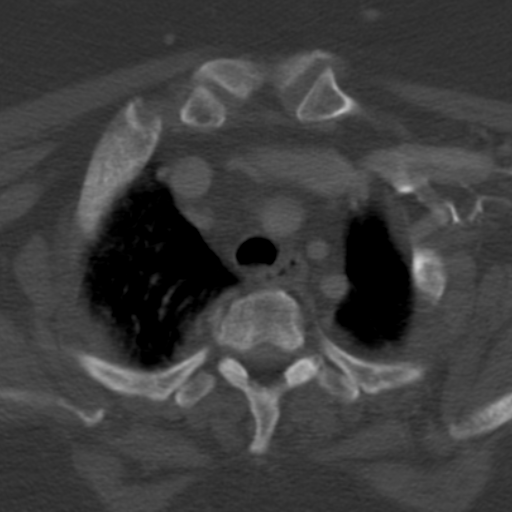
[im 44/110  bone]
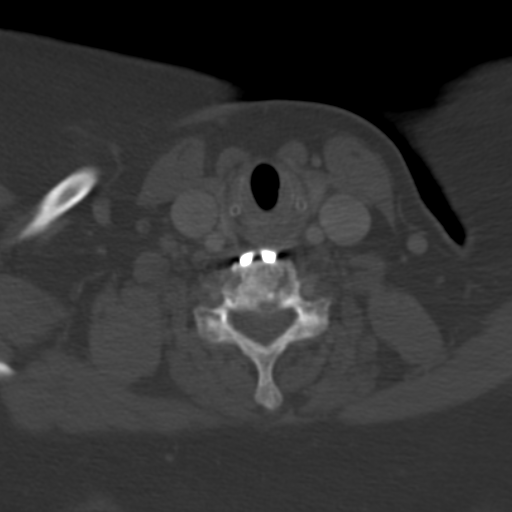
[im 66/110  bone]
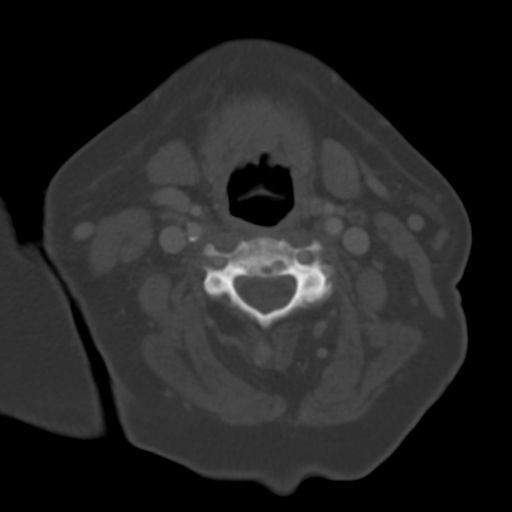

[15 of 33 positions shown; findings below may reference images not displayed]

FINDINGS: Visualized intracranial contents are unremarkable.

Both parotid glands are normal. Both submandibular glands are
normal. The thyroid gland is normal.

No mucosal or submucosal abnormality is seen. Specific attention to
the piriform sinus regions does not show any mass. Soft tissue at
the tongue base on the right is slightly more prominent than the
left, but this is not definitely abnormal. The glottic region
appears normal.

Arterial and venous structures are normal. There are no enlarged
lymph nodes on either side of the neck. There has been previous
cervical fusion from C5-C7.

Lung apices are not well seen because of breathing motion. There is
an indistinct density at the right lung apex measuring 8 mm. This
was present on the previous chest CT dated 04/03/2013 and would
qualify as a sub solid pulmonary nodule. Assuming that today's
examination qualifies as an initial 3 month follow-up study which is
stable, annual follow-up is recommended for at least 3 years to
ensure benign nature.
IMPRESSION: The clinical history references a piriform sinus mass seen on CT
scan of the chest. The examination from 04/03/2013 does not include
the piriform sinuses. Is there a different previous exam?

No piriform sinus mass on this neck CT. No cause of hoarseness
identified.

8 mm sub solid pulmonary nodule at the right apex. No apparent
change since the study of 04/03/2013. This examination does suffer
from motion degradation however. Assuming that this qualifies as a
three-month follow-up without change, yearly follow-up is suggested
with CT for at least 3 years.

## 2014-02-24 ENCOUNTER — Other Ambulatory Visit: Payer: Self-pay | Admitting: *Deleted

## 2014-02-25 ENCOUNTER — Ambulatory Visit: Payer: Medicare Other | Admitting: Family Medicine

## 2014-02-25 MED ORDER — ROPINIROLE HCL 3 MG PO TABS: ORAL_TABLET | ORAL | Status: DC

## 2014-02-25 NOTE — Telephone Encounter (Signed)
Per pt, pharmacy had no record of receiving rx. I called in rx to [facility].

## 2014-02-27 ENCOUNTER — Ambulatory Visit: Payer: Medicare Other | Admitting: Gastroenterology

## 2014-02-27 ENCOUNTER — Encounter: Payer: Self-pay | Admitting: Gastroenterology

## 2014-02-27 ENCOUNTER — Ambulatory Visit (INDEPENDENT_AMBULATORY_CARE_PROVIDER_SITE_OTHER): Payer: Medicare Other | Admitting: Gastroenterology

## 2014-02-27 VITALS — BP 118/78 | HR 78 | Ht 61.5 in | Wt 217.0 lb

## 2014-02-27 DIAGNOSIS — R110 Nausea: Secondary | ICD-10-CM

## 2014-02-27 DIAGNOSIS — K7689 Other specified diseases of liver: Secondary | ICD-10-CM

## 2014-02-27 DIAGNOSIS — R0609 Other forms of dyspnea: Secondary | ICD-10-CM

## 2014-02-27 DIAGNOSIS — G4733 Obstructive sleep apnea (adult) (pediatric): Secondary | ICD-10-CM | POA: Diagnosis not present

## 2014-02-27 DIAGNOSIS — K7460 Unspecified cirrhosis of liver: Secondary | ICD-10-CM | POA: Insufficient documentation

## 2014-02-27 DIAGNOSIS — R0989 Other specified symptoms and signs involving the circulatory and respiratory systems: Secondary | ICD-10-CM

## 2014-02-27 DIAGNOSIS — K7581 Nonalcoholic steatohepatitis (NASH): Secondary | ICD-10-CM

## 2014-02-27 DIAGNOSIS — E118 Type 2 diabetes mellitus with unspecified complications: Secondary | ICD-10-CM

## 2014-02-27 DIAGNOSIS — K7469 Other cirrhosis of liver: Secondary | ICD-10-CM

## 2014-02-27 NOTE — Assessment & Plan Note (Signed)
Chronic intermittent nausea, together with upper epigastric pain, suggest ulcer or nonulcer dyspepsia.  Gastroparesis remains a concern as well despite negative gastric emptying scan in 2010.  Recommendations #1 upper endoscopy #2 repeat gastric emptying scan

## 2014-02-27 NOTE — Assessment & Plan Note (Signed)
Workup per pulmonary

## 2014-02-27 NOTE — Assessment & Plan Note (Signed)
Biopsy shows steatohepatitis with early fibrosis.  Discussed withn patient the high risk for developing cirrhosis.  Advised that weight loss is the only reliable means of leaching fat from the liver.  Recommended consideration of bariatric surgery.  Despite absence of fibrosis on liver biopsy CT scan suggest cirrhosis  Recommendations #1 upper endoscopy for surveillance of varices

## 2014-02-27 NOTE — Patient Instructions (Signed)
You have been scheduled for a gastric emptying scan at [facility] on 03/11/2014 at 7:30am. Please arrive at least 15 minutes prior to your appointment for registration. Please make certain not to have anything to eat or drink after midnight the night before your test. Hold all stomach medications (ex: Zofran, phenergan, Reglan) 48 hours prior to your test. If you need to reschedule your appointment, please contact radiology scheduling at [phone]. _____________________________________________________________________ A gastric-emptying study measures how long it takes for food to move through your stomach. There are several ways to measure stomach emptying. In the most common test, you eat food that contains a small amount of radioactive material. A scanner that detects the movement of the radioactive material is placed over your abdomen to monitor the rate at which food leaves your stomach. This test normally takes about 2 hours to complete. _____________________________________________________________________   Stephanie [name] have been scheduled for an endoscopy. Please follow written instructions given to you at your visit today. If you use inhalers (even only as needed), please bring them with you on the day of your procedure. Your physician has requested that you go to www.[email or web address] and enter the access code given to you at your visit today. This web site gives a general overview about your procedure. However, you should still follow specific instructions given to you by our office regarding your preparation for the procedure.

## 2014-02-27 NOTE — Progress Notes (Signed)
      History of Present Illness:  Stephanie [name] has returned for followup of Stephanie [name] and for evaluation of nausea.  Over the past 2 months she's been complaining of nausea.  Every 2-3 days she may have a full day with persistent nausea.  It is unrelated to eating.  With eating, however, she may develop some upper midepigastric discomfort.  She takes omeprazole daily.  Gastric emptying scan in 2010 was normal.  Patient has Stephanie [name] as determined by liver biopsy.  CT scan in January, 2014 was suggestive of cirrhosis.  Endoscopy one year ago did not demonstrate varices.  She is hepatitis B surface antigen positive the    Review of Systems: She complains of dyspnea on exertion Pertinent positive and negative review of systems were noted in the above HPI section. All other review of systems were otherwise negative.    Current Medications, Allergies, Past Medical History, Past Surgical History, Family History and Social History were reviewed in [facility] record  Vital signs were reviewed in today's medical record. Physical Exam: General: Obese female in no acute distress Skin: anicteric Head: Normocephalic and atraumatic Eyes:  sclerae anicteric, EOMI Ears: Normal auditory acuity Mouth: No deformity or lesions Lungs: Clear throughout to auscultation Heart: Regular rate and rhythm; no murmurs, rubs or bruits Abdomen: Soft,  and non distended. No masses, hepatosplenomegaly or hernias noted. Normal Bowel sounds.  There is a moderate tenderness in the upper midepigastric area without guarding or rebound.  There is no succussion splash Rectal:deferred Musculoskeletal: Symmetrical with no gross deformities  Pulses:  Normal pulses noted Extremities: No clubbing, cyanosis, edema or deformities noted Neurological: Alert oriented x 4, grossly nonfocal Psychological:  Alert and cooperative. Normal mood and affect  See Assessment and Plan under Problem List

## 2014-02-28 ENCOUNTER — Telehealth: Payer: Self-pay | Admitting: Pulmonary Disease

## 2014-02-28 MED ORDER — AEROCHAMBER Z-STAT PLUS MISC: Status: DC

## 2014-02-28 NOTE — Telephone Encounter (Signed)
Please tell her to try adjusting temperature on her CPAP humidifier up >> more warm, moist airway while using CPAP should alleviate some of her throat symptoms.  She can also try using biotene mouth rinse before bedtime.  Please also have her use a spacer device with dulera.  She should call back if these do not help, and her wheezing persists.

## 2014-02-28 NOTE — Telephone Encounter (Signed)
Called spoke with pt. She reports every morning she is waking up with a sore throat. She reports she is washing her mouth out after each time she uses her inhaler. She also wants to know if her CPAP could be causing this. Pt states also she woke up this morning with a deep dry hacking cough and wheezing.  Pt denies any nasal congestion, PND, no f/c/s/n/v. Please advise Dr. Halford Chessman thanks  No Known Allergies   Current Outpatient Prescriptions on File Prior to Visit  Medication Sig Dispense Refill  . ABILIFY 30 MG tablet TAKE 1 TABLET BY MOUTH EVERY MORNING  30 tablet  0  . albuterol (PROAIR HFA) 108 (90 BASE) MCG/ACT inhaler INHALE 2 PUFFS INTO THE LUNGS EVERY 6 (SIX) HOURS AS NEEDED FOR WHEEZING.  8.5 each  3  . ALORA 0.1 MG/24HR patch PLACE 1 PATCH (0.1 MG TOTAL) ONTO THE SKIN 2 (TWO) TIMES A WEEK.  8 patch  3  . budesonide-formoterol (SYMBICORT) 160-4.5 MCG/ACT inhaler Inhale 2 puffs into the lungs 2 (two) times daily.  1 Inhaler  3  . clonazePAM (KLONOPIN) 0.5 MG tablet TAKE 1 TABET BY MOUTH 2 TIMES A DAY AS NEEDED FOR ANXIETY  60 tablet  0  . [facility] STOOL SOFTENER 100 MG capsule TAKE 1 CAPSULE BY MOUTH 2 TIMES DAILY AS NEEDED FOR CONSTIPATION  60 capsule  3  . diazepam (VALIUM) 5 MG tablet TAKE 1 TABLET BY MOUTH 2 TIMES A DAY AS NEEDED FOR MUSCLE SPASMS  60 tablet  0  . ferrous sulfate 325 (65 FE) MG tablet TAKE 1 TABLET (325 MG TOTAL) BY MOUTH 2 (TWO) TIMES DAILY WITH A MEAL.  60 tablet  1  . glucose blood (ONE TOUCH ULTRA TEST) test strip USE AS DIRECTED TWICE DAILY Dx 250.52  100 each  0  . lamoTRIgine (LAMICTAL) 100 MG tablet Take 3 tablets by mouth once daily      . lidocaine (LIDODERM) 5 % PLACE 1 PATCH ONTO THE SKIN DAILY. REMOVE & DISCARD PATCH WITHIN 12 HOURS OR AS DIRECTED BY MD  30 patch  0  . metFORMIN (GLUCOPHAGE) 1000 MG tablet Take 0.5 tablets (500 mg total) by mouth 2 (two) times daily with a meal.  180 tablet  3  . metFORMIN (GLUCOPHAGE-XR) 500 MG 24 hr tablet TAKE 1 TABLET (500  MG TOTAL) BY MOUTH DAILY WITH BREAKFAST.  30 tablet  3  . mirtazapine (REMERON) 15 MG tablet TAKE 1 TABLET BY MOUTH AT BEDTIME  34 tablet  2  . nystatin (MYCOSTATIN/NYSTOP) 100000 UNIT/GM POWD APPLY TOPICALLY 2 TIMES DAILY AS DIRECTED  60 g  0  . nystatin-triamcinolone ointment (MYCOLOG) APPLY 1 APPLICATION TOPICALLY 2 (TWO) TIMES DAILY.  30 g  0  . omeprazole (PRILOSEC) 40 MG capsule TAKE 1 CAPSULE BY MOUTH DAILY.  90 capsule  1  . ONETOUCH DELICA LANCETS FINE MISC USE AS DIRECTED TWICE DAILY  100 each  0  . promethazine (PHENERGAN) 25 MG tablet TAKE 1/2 TO 1 TABLET BY MOUTH EVERY 8 HOURS AS NEEDED FOR NAUSEA  30 tablet  2  . rOPINIRole (REQUIP) 3 MG tablet TAKE 1 TABLET BY MOUTH AT BEDTIME  90 tablet  0  . salicylic acid 6 % gel APPLY TO AFFECTED AREA EVERY DAY *USE AT NIGHT AND RINSE OFF IN THE MORNING*  40 g  0  . simvastatin (ZOCOR) 10 MG tablet Take 1 tablet (10 mg total) by mouth at bedtime.  90 tablet  3  .  tolterodine (DETROL LA) 2 MG 24 hr capsule TAKE ONE CAPSULE BY MOUTH DAILY  90 capsule  1  . traMADol (ULTRAM) 50 MG tablet TAKE 2 TABLETS BY MOUTH EVERY MORNING AND EVENING AS NEEDED  120 tablet  0  . urea (CARMOL) 40 % CREA Apply nightly to feet      . vitamin D, CHOLECALCIFEROL, 400 UNITS tablet Take two by mouth daily  6 tablet  6  . zolpidem (AMBIEN) 5 MG tablet TAKE 1/2 TO 1 TABLET BY MOUTH AT BEDTIME AS NEEDED  30 tablet  0   No current facility-administered medications on file prior to visit.

## 2014-02-28 NOTE — Telephone Encounter (Signed)
Called spoke with pt. Aware of recs. She will try this out. Spacer w/ forms left for pick up for pt to sign as well.

## 2014-03-04 ENCOUNTER — Other Ambulatory Visit: Payer: Self-pay | Admitting: Family Medicine

## 2014-03-04 ENCOUNTER — Other Ambulatory Visit: Payer: Self-pay

## 2014-03-04 ENCOUNTER — Other Ambulatory Visit: Payer: Self-pay | Admitting: *Deleted

## 2014-03-04 MED ORDER — GLUCOSE BLOOD VI STRP: ORAL_STRIP | Status: DC

## 2014-03-04 MED ORDER — ZOLPIDEM TARTRATE 5 MG PO TABS: ORAL_TABLET | ORAL | Status: DC

## 2014-03-04 NOTE — Telephone Encounter (Signed)
Pt requesting medication refill. Last f/u appt 10/2013. Ok to fill per Dr Deborra Medina. Rx will be faxed to requested pharmacy by end of day 03/05/14

## 2014-03-04 NOTE — Telephone Encounter (Signed)
Pt request onetouch ultra test strips refilled to [facility]; pt advised done.

## 2014-03-05 ENCOUNTER — Other Ambulatory Visit: Payer: Self-pay | Admitting: *Deleted

## 2014-03-05 NOTE — Telephone Encounter (Signed)
Pt requesting medication refill. Pt has only been seen for acute visits. Pls advise

## 2014-03-06 MED ORDER — LIDOCAINE 5 % EX PTCH: MEDICATED_PATCH | CUTANEOUS | Status: DC

## 2014-03-07 ENCOUNTER — Encounter: Payer: Self-pay | Admitting: Gastroenterology

## 2014-03-11 ENCOUNTER — Encounter (HOSPITAL_COMMUNITY)
Admission: RE | Admit: 2014-03-11 | Discharge: 2014-03-11 | Disposition: A | Discharge status: Home / Self Care | Payer: Medicare Other | Source: Ambulatory Visit | Attending: Gastroenterology | Admitting: Gastroenterology

## 2014-03-11 DIAGNOSIS — R1013 Epigastric pain: Secondary | ICD-10-CM | POA: Diagnosis not present

## 2014-03-11 DIAGNOSIS — R110 Nausea: Secondary | ICD-10-CM | POA: Diagnosis not present

## 2014-03-11 DIAGNOSIS — K219 Gastro-esophageal reflux disease without esophagitis: Secondary | ICD-10-CM | POA: Diagnosis not present

## 2014-03-11 LAB — GLUCOSE, CAPILLARY: GLUCOSE-CAPILLARY: 144 mg/dL — AB (ref 70–99)

## 2014-03-11 MED ORDER — TECHNETIUM TC 99M SULFUR COLLOID: 2.2000 | Freq: Once | INTRAVENOUS | Status: AC | PRN

## 2014-03-17 ENCOUNTER — Telehealth: Payer: Self-pay | Admitting: Gastroenterology

## 2014-03-18 ENCOUNTER — Other Ambulatory Visit: Payer: Self-pay | Admitting: *Deleted

## 2014-03-18 MED ORDER — TOLTERODINE TARTRATE ER 2 MG PO CP24: ORAL_CAPSULE | ORAL | Status: DC

## 2014-03-25 ENCOUNTER — Other Ambulatory Visit: Payer: Self-pay | Admitting: *Deleted

## 2014-03-25 MED ORDER — DIAZEPAM 5 MG PO TABS: ORAL_TABLET | ORAL | Status: DC

## 2014-03-25 MED ORDER — CLONAZEPAM 0.5 MG PO TABS: ORAL_TABLET | ORAL | Status: DC

## 2014-03-25 NOTE — Telephone Encounter (Signed)
Pt requesting medication refill. Pt has only been seen recently for acute visits. Ok to fill per Dr Deborra Medina. Rx will indicate f/u appt is required for any additional refills and will be faxed to requested pharmacy before end of day, today.

## 2014-03-25 NOTE — Addendum Note (Signed)
Addended by: Modena Nunnery on: 03/25/2014 09:33 AM   Modules accepted: Orders

## 2014-03-26 ENCOUNTER — Telehealth: Payer: Self-pay | Admitting: Gastroenterology

## 2014-03-26 ENCOUNTER — Other Ambulatory Visit: Payer: Self-pay | Admitting: *Deleted

## 2014-03-26 MED ORDER — ZOLPIDEM TARTRATE 5 MG PO TABS: ORAL_TABLET | ORAL | Status: DC

## 2014-03-26 NOTE — Telephone Encounter (Signed)
Rx called in as directed.   

## 2014-03-26 NOTE — Telephone Encounter (Signed)
Ok to refill 

## 2014-03-26 NOTE — Telephone Encounter (Signed)
Recall EGD 04/10/14 with issue of nausea. She has a recall colon in December. Is it too early?

## 2014-03-27 ENCOUNTER — Ambulatory Visit (INDEPENDENT_AMBULATORY_CARE_PROVIDER_SITE_OTHER): Payer: Medicare Other | Admitting: Pulmonary Disease

## 2014-03-27 ENCOUNTER — Encounter: Payer: Self-pay | Admitting: Pulmonary Disease

## 2014-03-27 ENCOUNTER — Telehealth: Payer: Self-pay | Admitting: Pulmonary Disease

## 2014-03-27 ENCOUNTER — Other Ambulatory Visit (INDEPENDENT_AMBULATORY_CARE_PROVIDER_SITE_OTHER): Payer: Medicare Other

## 2014-03-27 VITALS — BP 168/98 | HR 94 | Ht 63.0 in | Wt 221.0 lb

## 2014-03-27 DIAGNOSIS — G2581 Restless legs syndrome: Secondary | ICD-10-CM | POA: Diagnosis not present

## 2014-03-27 DIAGNOSIS — G4733 Obstructive sleep apnea (adult) (pediatric): Secondary | ICD-10-CM

## 2014-03-27 DIAGNOSIS — D649 Anemia, unspecified: Secondary | ICD-10-CM

## 2014-03-27 DIAGNOSIS — J45909 Unspecified asthma, uncomplicated: Secondary | ICD-10-CM

## 2014-03-27 DIAGNOSIS — R0600 Dyspnea, unspecified: Secondary | ICD-10-CM

## 2014-03-27 DIAGNOSIS — R911 Solitary pulmonary nodule: Secondary | ICD-10-CM | POA: Diagnosis not present

## 2014-03-27 DIAGNOSIS — R030 Elevated blood-pressure reading, without diagnosis of hypertension: Secondary | ICD-10-CM

## 2014-03-27 DIAGNOSIS — IMO0001 Reserved for inherently not codable concepts without codable children: Secondary | ICD-10-CM | POA: Insufficient documentation

## 2014-03-27 LAB — FERRITIN: Ferritin: 100.1 ng/mL (ref 10.0–291.0)

## 2014-03-27 NOTE — Assessment & Plan Note (Addendum)
She is followed by Dr. Roxan Hockey for this.  She will have f/u CT chest in January 2016.

## 2014-03-27 NOTE — Assessment & Plan Note (Signed)
She had recent ER evaluation for dyspnea at [facility].  There apparently was concern for aspiration.  She is to follow up with GI to further assess GI motility and reflux.

## 2014-03-27 NOTE — Assessment & Plan Note (Signed)
She is to continue symbicort and prn albuterol.

## 2014-03-27 NOTE — Assessment & Plan Note (Signed)
She is compliant with CPAP and reports benefit.  She is to continue CPAP 8 cm H2O.

## 2014-03-27 NOTE — Assessment & Plan Note (Signed)
She had elevated BP today.  Her readings from September were okay.  Advised her to f/u with her PCP to further monitor.

## 2014-03-27 NOTE — Telephone Encounter (Signed)
i generally like to keep procedures separate

## 2014-03-27 NOTE — Progress Notes (Signed)
Chief Complaint  Patient presents with  . Follow-up    was in ER at [facility] 2 mths.for increased sob,good/bad days with sob,no cough,no wheezing,chest tightness occass.,no fcs    History of Present Illness: LATOYIA TECSON is a 63 y.o. female former smoker with OSA, dyspnea with asthma, and RLS.  She has a lung nodule followed by Dr. Roxan Hockey.  She was in [facility] recently for dypsnea.  She woke up feeling a heavy, pressure feeling on her chest.  Her cardiac assessment was negative.  She was told that she might have aspiration.  She has been seen by GI and is due to have endoscopy.  She is doing okay with her asthma.  She uses symbicort and proair.  These help.  She is doing well with her CPAP.  Her leg symptoms are much better.  TESTS: CT chest 06/22/11 >> focal GGO Rt apex 7 mm, 3 mm nodule LLL CT chest 07/02/13 >> 8 mm GGO nodule Rt apex, 3 mm nodule LLL PSG 08/13/13 >> REM AHI 27.9, SaO2 low 82%, Spent 15.1 min with SpO2 < 90%. CPAP 09/06/13 >> CPAP 8 >> AHI 0, + R, + S. PSG 10/18/13 >> AHI 6.9, SaO2 low 82%. Spent 139.2 min with SaO2 < 89%. ONO with CPAP and RA 11/11/13 >> Test time 8 hrs 59 min. Mean SpO2 92%, low SpO2 81%. Spent 9 min with SpO2 < 88% Echo 11/14/13 >> EF 55 to 60% Ferritin 12/13/13 >> 55.9; started ferrous sulfate PFT 02/07/14 >> FEV1 1.94 (66%), FEV1% 91, TLC 3.62 (77%), DLCO 75%, +BD CPAP 01/06/14 to 02/04/14 >> used on 30 of 30 nights with average 7 hrs and 1 min.  Average AHI is 1.7 with median CPAP 8 cm H2O.  PMHx, PSHx, Medications, Allergies, Fhx, Shx reviewed.  Physical Exam:  General - No distress ENT - No sinus tenderness, no oral exudate, no LAN Cardiac - s1s2 regular, no murmur Chest - No wheeze/rales/dullness Back - No focal tenderness Abd - Soft, non-tender Ext - No edema Neuro - Normal strength Skin - No rashes Psych - normal mood, and behavior   Assessment/Plan:  Chesley Mires, MD [facility] Pager:   [phone]

## 2014-03-27 NOTE — Assessment & Plan Note (Signed)
Improved.  Will repeat ferritin level >> if above 75, then she can stop iron supplementation.

## 2014-03-27 NOTE — Telephone Encounter (Signed)
     Component Value Date/Time   FERRITIN 100.1 03/27/2014 1115    Will have my nurse inform pt that lab test is better.  She does not need to take iron supplementation (ferrous sulfate) anymore.

## 2014-03-27 NOTE — Patient Instructions (Signed)
Lab test today Follow up in 6 months

## 2014-03-28 NOTE — Telephone Encounter (Signed)
Patient advised.

## 2014-04-01 ENCOUNTER — Other Ambulatory Visit: Payer: Self-pay | Admitting: Pulmonary Disease

## 2014-04-01 NOTE — Telephone Encounter (Signed)
Lmtcb. 04/01/14

## 2014-04-04 ENCOUNTER — Ambulatory Visit (INDEPENDENT_AMBULATORY_CARE_PROVIDER_SITE_OTHER): Payer: Medicare Other | Admitting: Family Medicine

## 2014-04-04 ENCOUNTER — Encounter: Payer: Self-pay | Admitting: Family Medicine

## 2014-04-04 VITALS — BP 148/74 | HR 81 | Temp 97.5°F | Wt 216.8 lb

## 2014-04-04 DIAGNOSIS — R21 Rash and other nonspecific skin eruption: Secondary | ICD-10-CM

## 2014-04-04 MED ORDER — FLUCONAZOLE 150 MG PO TABS: 150.0000 mg | ORAL_TABLET | ORAL | Status: DC

## 2014-04-04 NOTE — Progress Notes (Signed)
Pre visit review using our clinic review tool, if applicable. No additional management support is needed unless otherwise documented below in the visit note.  Rash on lower abd. No vaginal sx.  No discharge.  No FCNAVD.  Area on lower abd is irritated.  Present for a few days.  Using nystatin powder a few times a day w/o relief yet.    Meds, vitals, and allergies reviewed.   ROS: See HPI.  Otherwise, noncontributory.  nad Fungal rash under her pannus B. W/o ulceration.

## 2014-04-04 NOTE — Patient Instructions (Signed)
Keep using nystatin powder on the area. Take diflucan every other day- Friday, Sunday, Tuesday. Skip the simvastatin while taking diflucan.  That should help.  Take care.

## 2014-04-04 NOTE — Telephone Encounter (Signed)
Results have been explained to patient, pt expressed understanding. Nothing further needed.  

## 2014-04-06 DIAGNOSIS — R21 Rash and other nonspecific skin eruption: Secondary | ICD-10-CM | POA: Insufficient documentation

## 2014-04-06 NOTE — Assessment & Plan Note (Signed)
Looks fungal, use diflucan with med caution, see AVS, fu prn.  She agrees.

## 2014-04-10 ENCOUNTER — Encounter: Payer: Self-pay | Admitting: Gastroenterology

## 2014-04-10 ENCOUNTER — Ambulatory Visit (AMBULATORY_SURGERY_CENTER): Payer: Medicare Other | Admitting: Gastroenterology

## 2014-04-10 VITALS — BP 116/76 | HR 74 | Temp 97.2°F | Resp 48 | Ht 61.5 in | Wt 217.0 lb

## 2014-04-10 DIAGNOSIS — E119 Type 2 diabetes mellitus without complications: Secondary | ICD-10-CM | POA: Diagnosis not present

## 2014-04-10 DIAGNOSIS — R110 Nausea: Secondary | ICD-10-CM | POA: Diagnosis not present

## 2014-04-10 DIAGNOSIS — K319 Disease of stomach and duodenum, unspecified: Secondary | ICD-10-CM | POA: Diagnosis not present

## 2014-04-10 DIAGNOSIS — K2950 Unspecified chronic gastritis without bleeding: Secondary | ICD-10-CM

## 2014-04-10 LAB — GLUCOSE, CAPILLARY
GLUCOSE-CAPILLARY: 70 mg/dL (ref 70–99)
GLUCOSE-CAPILLARY: 120 mg/dL — AB (ref 70–99)
Glucose-Capillary: 115 mg/dL — ABNORMAL HIGH (ref 70–99)

## 2014-04-10 MED ORDER — DEXTROSE 5 % IV SOLN: INTRAVENOUS | Status: DC

## 2014-04-10 NOTE — Op Note (Signed)
[place]  [employer]. [place], [id number]   ENDOSCOPY PROCEDURE REPORT  PATIENT: Stephanie [name], Stephanie [name]  MR#: [id number] BIRTHDATE: 1951/01/21 , 50  yrs. old GENDER: female ENDOSCOPIST: Inda Castle, MD REFERRED BY:  [name], M.D. PROCEDURE DATE:  04/10/2014 PROCEDURE:  EGD w/ biopsy ASA CLASS:     Class II INDICATIONS:  nausea and screening for varices. MEDICATIONS: Monitored anesthesia care, Propofol 150 mg IV, and Simethicone 23m PO TOPICAL ANESTHETIC:  DESCRIPTION OF PROCEDURE: After the risks benefits and alternatives of the procedure were thoroughly explained, informed consent was obtained.  The LB [id number] was introduced through the mouth and advanced to the second portion of the duodenum , Without limitations.  The instrument was slowly withdrawn as the mucosa was fully examined.    STOMACH: Chronic gastritis (inflammation) was found in the gastric antrum.  There was mild erythema in the gastric antrum.  Biopsies were taken.   Except for the findings listed the EGD was otherwise normal.  Retroflexed views revealed no abnormalities.     The scope was then withdrawn from the patient and the procedure completed.  COMPLICATIONS: There were no immediate complications.  ENDOSCOPIC IMPRESSION: 1.   Chronic gastritis (inflammation) was found in the gastric antrum 2.   EGD was otherwise normal  RECOMMENDATIONS: Await biopsy results hold omeprazole for one week Office visit 6 weeks  REPEAT EXAM:  eSigned:  RInda Castle MD 04/10/2014 4:30 PM    CC:

## 2014-04-10 NOTE — Progress Notes (Signed)
Called to room to assist during endoscopic procedure.  Patient ID and intended procedure confirmed with present staff. Received instructions for my participation in the procedure from the performing physician.  

## 2014-04-10 NOTE — Patient Instructions (Signed)
YOU HAD AN ENDOSCOPIC PROCEDURE TODAY AT [facility]: Refer to the procedure report that was given to you for any specific questions about what was found during the examination.  If the procedure report does not answer your questions, please call your gastroenterologist to clarify.  If you requested that your care partner not be given the details of your procedure findings, then the procedure report has been included in a sealed envelope for you to review at your convenience later.  YOU SHOULD EXPECT: Some feelings of bloating in the abdomen. Passage of more gas than usual.  Walking can help get rid of the air that was put into your GI tract during the procedure and reduce the bloating. If you had a lower endoscopy (such as a colonoscopy or flexible sigmoidoscopy) you may notice spotting of blood in your stool or on the toilet paper. If you underwent a bowel prep for your procedure, then you may not have a normal bowel movement for a few days.  DIET: Your first meal following the procedure should be a light meal and then it is ok to progress to your normal diet.  A half-sandwich or bowl of soup is an example of a good first meal.  Heavy or fried foods are harder to digest and may make you feel nauseous or bloated.  Likewise meals heavy in dairy and vegetables can cause extra gas to form and this can also increase the bloating.  Drink plenty of fluids but you should avoid alcoholic beverages for 24 hours.  ACTIVITY: Your care partner should take you home directly after the procedure.  You should plan to take it easy, moving slowly for the rest of the day.  You can resume normal activity the day after the procedure however you should NOT DRIVE or use heavy machinery for 24 hours (because of the sedation medicines used during the test).    SYMPTOMS TO REPORT IMMEDIATELY: A gastroenterologist can be reached at any hour.  During normal business hours, 8:30 AM to 5:00 PM Monday through Friday,  call [phone].  After hours and on weekends, please call the GI answering service at [phone] who will take a message and have the physician on call contact you.   Following upper endoscopy (EGD)  Vomiting of blood or coffee ground material  New chest pain or pain under the shoulder blades  Painful or persistently difficult swallowing  New shortness of breath  Fever of 100F or higher  Black, tarry-looking stools  FOLLOW UP: If any biopsies were taken you will be contacted by phone or by letter within the next 1-3 weeks.  Call your gastroenterologist if you have not heard about the biopsies in 3 weeks.  Our staff will call the home number listed on your records the next business day following your procedure to check on you and address any questions or concerns that you may have at that time regarding the information given to you following your procedure. This is a courtesy call and so if there is no answer at the home number and we have not heard from you through the emergency physician on call, we will assume that you have returned to your regular daily activities without incident.  SIGNATURES/CONFIDENTIALITY: You and/or your care partner have signed paperwork which will be entered into your electronic medical record.  These signatures attest to the fact that that the information above on your After Visit Summary has been reviewed and is understood.  Full responsibility  of the confidentiality of this discharge information lies with you and/or your care-partner.  Gastritis information given.  Hold omeprazole/prilosec for one week.  Office appointment with Dr. Deatra Ina in 6 weeks.

## 2014-04-10 NOTE — Progress Notes (Signed)
Patient awakening,vss,report to rn 

## 2014-04-10 NOTE — Progress Notes (Signed)
PT. CBG= 70 NO S/S OF HYPOGLYCEMIA, DR. KAPLAN INSTRUCTED TO HANG D5W.  CBG RECHECKED @ 1549=120.

## 2014-04-11 ENCOUNTER — Telehealth: Payer: Self-pay | Admitting: *Deleted

## 2014-04-11 NOTE — Telephone Encounter (Signed)
  Follow up Call-  Call back number 04/10/2014 03/27/2013 02/07/2012  Post procedure Call Back phone  # [phone]  Permission to leave phone message Yes Yes Yes     Patient questions:  Do you have a fever, pain , or abdominal swelling? No. Pain Score  0 *  Have you tolerated food without any problems? Yes.    Have you been able to return to your normal activities? Yes.    Do you have any questions about your discharge instructions: Diet   No. Medications  No. Follow up visit  No.  Do you have questions or concerns about your Care? No.  Actions: * If pain score is 4 or above: No action needed, pain <4.

## 2014-04-14 ENCOUNTER — Other Ambulatory Visit: Payer: Self-pay

## 2014-04-14 ENCOUNTER — Telehealth: Payer: Self-pay | Admitting: Pulmonary Disease

## 2014-04-14 MED ORDER — TRAMADOL HCL 50 MG PO TABS: ORAL_TABLET | ORAL | Status: DC

## 2014-04-14 NOTE — Telephone Encounter (Signed)
Pt left vm requesting refill tramadol to [facility].Please advise.

## 2014-04-14 NOTE — Telephone Encounter (Signed)
Rx faxed to requested pharmacy

## 2014-04-14 NOTE — Telephone Encounter (Signed)
Okay to resume ferrous sulfate 325 mg daily

## 2014-04-14 NOTE — Telephone Encounter (Signed)
Spoke with the pt  She is c/o aching in legs "just like before" after stopping iron pills  We had advised her to stop the iron on 03/27/14 and pain started approx 3 days ago  VS- please advise if okay to start back on her iron? Thanks!

## 2014-04-15 ENCOUNTER — Other Ambulatory Visit: Payer: Self-pay | Admitting: Pulmonary Disease

## 2014-04-15 ENCOUNTER — Other Ambulatory Visit: Payer: Self-pay | Admitting: *Deleted

## 2014-04-15 MED ORDER — PROMETHAZINE HCL 25 MG PO TABS: ORAL_TABLET | ORAL | Status: DC

## 2014-04-15 NOTE — Telephone Encounter (Signed)
Last office visit 04/04/2014 with Dr. Damita Dunnings for rash.  Last refilled 01/02/2014 for #30 with 1 refill.  Ok to refill?

## 2014-04-15 NOTE — Telephone Encounter (Signed)
Spoke with pt and advised of Dr Juanetta Gosling recommendations to restart iron tablets.

## 2014-04-16 ENCOUNTER — Other Ambulatory Visit: Payer: Self-pay

## 2014-04-16 ENCOUNTER — Telehealth: Payer: Self-pay | Admitting: Gastroenterology

## 2014-04-16 MED ORDER — OMEPRAZOLE 20 MG PO CPDR: 20.0000 mg | DELAYED_RELEASE_CAPSULE | Freq: Two times a day (BID) | ORAL | Status: DC

## 2014-04-16 NOTE — Telephone Encounter (Signed)
EGD on 04/10/14 showed chronic gastritis and she was taken off the Prilosec for 1 week. She calls with a 3 day history of increased gas and burning of her stomach. Feels sore mid-epigastric and it is uncomfortable if she applies pressure to the area. No reflux or vomiting. She has not tried any OTC remedies. She is eating yogurt. What do you recommend? She is a [employer] patient.

## 2014-04-16 NOTE — Telephone Encounter (Signed)
If symptoms have increased since she stopped omeprazole please resume omeprazole at 20 mg po bid and Gavison prn. If that is not the case trail of Carafate 1g tid

## 2014-04-16 NOTE — Telephone Encounter (Signed)
Patient agrees to this plan. She will call us if her symptoms do not begin to improve after a week.

## 2014-04-17 ENCOUNTER — Encounter: Payer: Self-pay | Admitting: Gastroenterology

## 2014-04-18 ENCOUNTER — Telehealth: Payer: Self-pay | Admitting: Gastroenterology

## 2014-04-18 NOTE — Telephone Encounter (Signed)
If she hasn't already I would like her to stop Prilosec for one week.  This could be causing nausea.  For the pain under her left breast she should see her PCP.  This is probably musculoskeletal pain

## 2014-04-18 NOTE — Telephone Encounter (Signed)
Patient advised.

## 2014-04-18 NOTE — Telephone Encounter (Signed)
Patient reports a continuation of nausea, gas, belching and a soreness under her left breast. No redness of skin, the soreness is "inside". She is taking prilosec 20 mg BID and Gavison in between PRN. She says she doesn't even want to sit up straight. Please advise.There are no openings for today.

## 2014-04-18 NOTE — Telephone Encounter (Signed)
Pt wants to be reached at another number.  [phone]

## 2014-04-18 NOTE — Telephone Encounter (Signed)
I have left message for the patient to call back

## 2014-04-21 ENCOUNTER — Ambulatory Visit (INDEPENDENT_AMBULATORY_CARE_PROVIDER_SITE_OTHER)
Admission: RE | Admit: 2014-04-21 | Discharge: 2014-04-21 | Disposition: A | Discharge status: Home / Self Care | Payer: Medicare Other | Source: Ambulatory Visit | Attending: Family Medicine | Admitting: Family Medicine

## 2014-04-21 ENCOUNTER — Encounter: Payer: Self-pay | Admitting: Family Medicine

## 2014-04-21 ENCOUNTER — Ambulatory Visit (INDEPENDENT_AMBULATORY_CARE_PROVIDER_SITE_OTHER): Payer: Medicare Other | Admitting: Family Medicine

## 2014-04-21 VITALS — BP 128/86 | HR 84 | Temp 98.0°F | Wt 221.2 lb

## 2014-04-21 DIAGNOSIS — T148 Other injury of unspecified body region: Secondary | ICD-10-CM | POA: Diagnosis not present

## 2014-04-21 DIAGNOSIS — R0781 Pleurodynia: Secondary | ICD-10-CM

## 2014-04-21 DIAGNOSIS — W57XXXA Bitten or stung by nonvenomous insect and other nonvenomous arthropods, initial encounter: Secondary | ICD-10-CM | POA: Diagnosis not present

## 2014-04-21 MED ORDER — OXYCODONE-ACETAMINOPHEN 5-325 MG PO TABS: 1.0000 | ORAL_TABLET | Freq: Three times a day (TID) | ORAL | Status: DC | PRN

## 2014-04-21 NOTE — Progress Notes (Signed)
Pre visit review using our clinic review tool, if applicable. No additional management support is needed unless otherwise documented below in the visit note. 

## 2014-04-21 NOTE — Patient Instructions (Signed)
It was good to see you. Keep antibiotic ointment on your tick bite. If you start develop any symptoms like worsening rash, fever, or headache, call me immediately. I will call you with your xray results.  Percocet as needed for severe pain.

## 2014-04-21 NOTE — Progress Notes (Signed)
Patient ID: Stephanie [name], female   DOB: December 26, 1950, 63 y.o.   MRN: [id number]   Subjective:   Patient ID: Stephanie [name], female    DOB: July 20, 1950, 63 y.o.   MRN: [id number]  Stephanie [name] is a pleasant 89 y.o. year old female who presents to clinic today with Gastrophageal Reflux and Insect Bite  on 04/21/2014  HPI: Tick on right thigh- noticed it 2 days ago.  She thinks she "got all of the tick out." Has noticed redness and warmth around where tick was removed. No fevers, chills, NA, nausea or vomiting. No other rashes.  Left sided chest/rib pain- since having endoscopy on 04/10/14.  Started on prilosec 20 mg twice daily.  Pt called Dr. Kelby Fam office regarding pain who advised her to follow up with me. Pain is reproducible.  Also hurts when she is sleeping because she sleeps on her side.  Pain worse with coughing and deep inspirations. No DOE.  No diaphoresis.  Current Outpatient Prescriptions on File Prior to Visit  Medication Sig Dispense Refill  . ABILIFY 30 MG tablet TAKE 1 TABLET BY MOUTH EVERY MORNING 30 tablet 0  . albuterol (PROAIR HFA) 108 (90 BASE) MCG/ACT inhaler INHALE 2 PUFFS INTO THE LUNGS EVERY 6 (SIX) HOURS AS NEEDED FOR WHEEZING. 8.5 each 3  . ALORA 0.1 MG/24HR patch PLACE 1 PATCH (0.1 MG TOTAL) ONTO THE SKIN 2 (TWO) TIMES A WEEK. 8 patch 3  . budesonide-formoterol (SYMBICORT) 160-4.5 MCG/ACT inhaler Inhale 2 puffs into the lungs 2 (two) times daily. 1 Inhaler 3  . clonazePAM (KLONOPIN) 0.5 MG tablet TAKE 1 TABET BY MOUTH 2 TIMES A DAY AS NEEDED FOR ANXIETY 60 tablet 0  . [facility] STOOL SOFTENER 100 MG capsule TAKE 1 CAPSULE BY MOUTH 2 TIMES DAILY AS NEEDED FOR CONSTIPATION 60 capsule 3  . diazepam (VALIUM) 5 MG tablet TAKE 1 TABLET BY MOUTH 2 TIMES A DAY AS NEEDED FOR MUSCLE SPASMS 60 tablet 0  . ferrous sulfate 325 (65 FE) MG tablet TAKE 1 TABLET (325 MG TOTAL) BY MOUTH 2 (TWO) TIMES DAILY WITH A MEAL. 60 tablet 1  . glucose blood (ONE TOUCH ULTRA TEST) test strip  USE AS DIRECTED TWICE DAILY Dx 250.52 100 each 5  . lamoTRIgine (LAMICTAL) 100 MG tablet Take 3 tablets by mouth once daily    . lidocaine (LIDODERM) 5 % PLACE 1 PATCH ONTO THE SKIN DAILY. REMOVE & DISCARD PATCH WITHIN 12 HOURS OR AS DIRECTED BY MD 30 patch 0  . metFORMIN (GLUCOPHAGE) 1000 MG tablet Take 0.5 tablets (500 mg total) by mouth 2 (two) times daily with a meal. 180 tablet 3  . mirtazapine (REMERON) 15 MG tablet TAKE 1 TABLET BY MOUTH AT BEDTIME 34 tablet 2  . nystatin (MYCOSTATIN/NYSTOP) 100000 UNIT/GM POWD APPLY TOPICALLY 2 TIMES DAILY AS DIRECTED 60 g 0  . nystatin-triamcinolone ointment (MYCOLOG) APPLY 1 APPLICATION TOPICALLY 2 (TWO) TIMES DAILY. 30 g 0  . omeprazole (PRILOSEC) 20 MG capsule Take 1 capsule (20 mg total) by mouth 2 (two) times daily before a meal. 60 capsule 3  . ONETOUCH DELICA LANCETS FINE MISC USE AS DIRECTED TWICE DAILY 100 each 0  . promethazine (PHENERGAN) 25 MG tablet TAKE 1/2 TO 1 TABLET BY MOUTH EVERY 8 HOURS AS NEEDED FOR NAUSEA 30 tablet 2  . rOPINIRole (REQUIP) 3 MG tablet TAKE 1 TABLET BY MOUTH AT BEDTIME 90 tablet 0  . salicylic acid 6 % gel APPLY TO AFFECTED AREA EVERY DAY *USE  AT NIGHT AND RINSE OFF IN THE MORNING* 40 g 0  . simvastatin (ZOCOR) 10 MG tablet Take 1 tablet (10 mg total) by mouth at bedtime. 90 tablet 3  . Spacer/Aero-Holding Chambers (AEROCHAMBER Z-STAT PLUS) inhaler Use as instructed 1 each 0  . tolterodine (DETROL LA) 2 MG 24 hr capsule TAKE ONE CAPSULE BY MOUTH DAILY 90 capsule 0  . traMADol (ULTRAM) 50 MG tablet TAKE 2 TABLETS BY MOUTH EVERY MORNING AND EVENING AS NEEDED 120 tablet 0  . urea (CARMOL) 40 % CREA Apply nightly to feet    . vitamin D, CHOLECALCIFEROL, 400 UNITS tablet Take two by mouth daily 6 tablet 6  . zolpidem (AMBIEN) 5 MG tablet TAKE 1/2 TO 1 TABLET BY MOUTH AT BEDTIME AS NEEDED 30 tablet 0   No current facility-administered medications on file prior to visit.    No Known Allergies  Past Medical History    Diagnosis Date  . Adenomatous colon polyp   . Insomnia   . RLS (restless legs syndrome)   . Colon polyp   . Incontinence   . Hepatitis B, chronic   . H/O: rheumatic fever   . Hyperlipidemia   . Unspecified disorders of nervous system   . Bipolar affective disorder     h/o  . H/O: CVA (cardiovascular accident)     TIA  . Fatty liver 04/09/08    found in abd CT  . Shortness of breath   . Obstructive sleep apnea     not using CPAP, last sleep study 2.5 years ago, per pt doctor is aware  . Blood transfusion 2000  . GERD (gastroesophageal reflux disease)   . Arthritis   . Aortic dissection     Type 1  . Asthma   . Anxiety   . Depression   . Stroke     TIA- 2002  . Ulcer   . Diabetes mellitus without complication   . Anemia   . Blood transfusion without reported diagnosis   . Sleep apnea     Past Surgical History  Procedure Laterality Date  . Back surgery      x 45  . Cholecystectomy    . Rotator cuff repair      left  . Abdominal hysterectomy      total  . Lumbar fusion  10/09  . Hemorroidectomy      and colon polyp removed  . Abdominal exploration surgery    . Posterior cervical fusion/foraminotomy    . Eye surgery      bilat  . Lumbar wound debridement  05/09/2011    Procedure: LUMBAR WOUND DEBRIDEMENT;  Surgeon: Dahlia Bailiff;  Location: [facility];  Service: Orthopedics;  Laterality: N/A;  IRRIGATION AND DEBRIDEMENT SPINAL WOUND  . Axillary artery cannulation via 8-mm hemashield graft, median sternotomy, extracorporeal circulation with deep hypothermic circulatory arrest, repair of  aortic dessection  01/23/2009    Dr Arlyce Dice  . Cataract      both eyes  . Colonoscopy    . Liver biopsy  04/10/2012    Procedure: LIVER BIOPSY;  Surgeon: Inda Castle, MD;  Location: [facility];  Service: Endoscopy;  Laterality: N/A;  ultrasound to mark order for abd limited/liver to be marked to be sent by [email or web address]     Family History  Problem Relation Age of Onset  .  Colon cancer Neg Hx   . Esophageal cancer Neg Hx   . Rectal cancer Neg Hx   . Breast cancer Mother   .  Lung cancer Mother   . Stomach cancer Father   . Diabetes      4 aunts, and 1 uncle    History   Social History  . Marital Status: Divorced    Spouse Name: N/A    Number of Children: 1  . Years of Education: N/A   Occupational History  . Disabled    Social History Main Topics  . Smoking status: Former Smoker -- 0.50 packs/day for 50 years    Types: Cigarettes    Quit date: 04/06/2013  . Smokeless tobacco: Never Used  . Alcohol Use: No  . Drug Use: No  . Sexual Activity: Not on file   Other Topics Concern  . Not on file   Social History Narrative   1 son, 60+ y/o   Daily caffeine use: 2 cups daily   Does not get regular exercise   Disability due to bipolar      Has a living will- she is a DNR (she has form at home per pt).   The PMH, PSH, Social History, Family History, Medications, and allergies have been reviewed in [facility], and have been updated if relevant.   Review of Systems  Constitutional: Negative for fever.  Cardiovascular: Negative for palpitations and leg swelling.  Gastrointestinal: Negative for nausea, vomiting, abdominal pain, diarrhea, constipation and abdominal distention.  Musculoskeletal: Negative for neck pain and neck stiffness.  All other systems reviewed and are negative.      Objective:    BP 128/86 mmHg  Pulse 84  Temp(Src) 98 F (36.7 C) (Oral)  Wt 221 lb 4 oz (100.358 kg)  SpO2 96%   Physical Exam  Constitutional: She appears well-developed and well-nourished. No distress.  HENT:  Head: Normocephalic.  Pulmonary/Chest: Effort normal and breath sounds normal.    Skin:     Psychiatric: She has a normal mood and affect. Her speech is normal and behavior is normal. Thought content normal.  Nursing note and vitals reviewed.         Assessment & Plan:   Rib pain on left side - Plan: DG Chest 2 View  Tick bite No  Follow-up on file.

## 2014-04-21 NOTE — Assessment & Plan Note (Signed)
New- ribs extremely tender, does seem MSK not cardiac or gastric. ? Trauma from endoscopy/perhaps how she was laying. CXR today but did advise it may not show rib fractures. Percocet prn severe pain- #20 given only. Call or return to clinic prn if these symptoms worsen or fail to improve as anticipated. The patient indicates understanding of these issues and agrees with the plan.

## 2014-04-21 NOTE — Assessment & Plan Note (Signed)
New- with what appears to be localized reaction to tick bite. Advised topical abx and given red flag symptoms that would warrant follow up (signs of tick borne illness). Call or return to clinic prn if these symptoms worsen or fail to improve as anticipated. The patient indicates understanding of these issues and agrees with the plan.

## 2014-04-24 ENCOUNTER — Other Ambulatory Visit: Payer: Self-pay | Admitting: *Deleted

## 2014-04-24 MED ORDER — DIAZEPAM 5 MG PO TABS: ORAL_TABLET | ORAL | Status: DC

## 2014-04-24 MED ORDER — LIDOCAINE 5 % EX PTCH: MEDICATED_PATCH | CUTANEOUS | Status: DC

## 2014-04-24 MED ORDER — CLONAZEPAM 0.5 MG PO TABS: ORAL_TABLET | ORAL | Status: DC

## 2014-04-24 NOTE — Telephone Encounter (Signed)
Pt let v/m requesting cb with status of refills requested by [facility].

## 2014-04-24 NOTE — Telephone Encounter (Signed)
Pt requesting medication refill. Last f/u appt 07/2013, all other OV acute. pls advise

## 2014-04-25 ENCOUNTER — Other Ambulatory Visit: Payer: Self-pay | Admitting: *Deleted

## 2014-04-25 DIAGNOSIS — M545 Low back pain: Secondary | ICD-10-CM | POA: Diagnosis not present

## 2014-04-25 DIAGNOSIS — M4698 Unspecified inflammatory spondylopathy, sacral and sacrococcygeal region: Secondary | ICD-10-CM | POA: Diagnosis not present

## 2014-04-25 MED ORDER — ESTRADIOL 0.1 MG/24HR TD PTTW: MEDICATED_PATCH | TRANSDERMAL | Status: DC

## 2014-04-25 NOTE — Telephone Encounter (Signed)
Rx called in to requested pharmacy 

## 2014-04-28 ENCOUNTER — Telehealth: Payer: Self-pay | Admitting: Family Medicine

## 2014-04-28 ENCOUNTER — Other Ambulatory Visit: Payer: Self-pay | Admitting: *Deleted

## 2014-04-28 MED ORDER — DOXYCYCLINE HYCLATE 100 MG PO TABS: 100.0000 mg | ORAL_TABLET | Freq: Two times a day (BID) | ORAL | Status: DC

## 2014-04-28 MED ORDER — ZOLPIDEM TARTRATE 5 MG PO TABS: ORAL_TABLET | ORAL | Status: DC

## 2014-04-28 MED ORDER — DOCUSATE SODIUM 100 MG PO CAPS: ORAL_CAPSULE | ORAL | Status: DC

## 2014-04-28 NOTE — Telephone Encounter (Signed)
Rx called in to requested pharmacy 

## 2014-04-28 NOTE — Telephone Encounter (Signed)
Concerning for tick borne illness.  Will call in rx for doxycyline.  Needs to be seen as soon as possible- UC or ER tonight if symptoms worsen.

## 2014-04-28 NOTE — Telephone Encounter (Signed)
Spoke to pt and advised per Dr Deborra Medina. Pt states she will pickup Rx and will probably go to be seen tonight at ED or UC

## 2014-04-28 NOTE — Telephone Encounter (Signed)
Pt requesting medication refill. Pt has not had f/u appt, only acute. Pls advise

## 2014-04-28 NOTE — Telephone Encounter (Signed)
Patient Information:  Caller Name: Stephanie [name]  Phone: [phone]  Patient: Stephanie [name]  Gender: Female  DOB: March 25, 1951  Age: 63 Years  PCP: Stephanie [name] [facility])  Office Follow Up:  Does the office need to follow up with this patient?: Yes  Instructions For The Office: NO AVAILABLE APPOINTMENTS, PLEASE FOLLOW UP WITH PATIENT.   Symptoms  Reason For Call & Symptoms:   Tickbite 11/14. Evaluated by Dr. Deborra Medina on 11/6. Since then, patient notes having nausea, headache, decreased appetite, feeling like she could pass out at times, and night sweats. The site itself appears red, with a red ring around it, like a bullseye or target. NO AVAILABLE APPOINTMENTS, PLEASE FOLLOW UP WITH PATIENT.  Reviewed Health History In EMR: Yes   Reviewed Medications In EMR: Yes  Reviewed Allergies In EMR: Yes  Reviewed Surgeries / Procedures: Yes  Date of Onset of Symptoms: 04/19/2014  Guideline(s) Used:  Tick Bite  Disposition Per Guideline:   See Today in Office  Reason For Disposition Reached:   Red ring or bull's-eye rash occurs around a deer tick bite  Advice Given:  N/A  Patient Will Follow Care Advice:  YES

## 2014-05-03 ENCOUNTER — Emergency Department: Payer: Self-pay | Admitting: Emergency Medicine

## 2014-05-03 DIAGNOSIS — Z79891 Long term (current) use of opiate analgesic: Secondary | ICD-10-CM | POA: Diagnosis not present

## 2014-05-03 DIAGNOSIS — Z791 Long term (current) use of non-steroidal anti-inflammatories (NSAID): Secondary | ICD-10-CM | POA: Diagnosis not present

## 2014-05-03 DIAGNOSIS — Z79899 Other long term (current) drug therapy: Secondary | ICD-10-CM | POA: Diagnosis not present

## 2014-05-03 DIAGNOSIS — F419 Anxiety disorder, unspecified: Secondary | ICD-10-CM | POA: Diagnosis not present

## 2014-05-03 DIAGNOSIS — Z87891 Personal history of nicotine dependence: Secondary | ICD-10-CM | POA: Diagnosis not present

## 2014-05-03 DIAGNOSIS — E119 Type 2 diabetes mellitus without complications: Secondary | ICD-10-CM | POA: Diagnosis not present

## 2014-05-03 DIAGNOSIS — T364X5A Adverse effect of tetracyclines, initial encounter: Secondary | ICD-10-CM | POA: Diagnosis not present

## 2014-05-03 DIAGNOSIS — Z7982 Long term (current) use of aspirin: Secondary | ICD-10-CM | POA: Diagnosis not present

## 2014-05-03 DIAGNOSIS — Z792 Long term (current) use of antibiotics: Secondary | ICD-10-CM | POA: Diagnosis not present

## 2014-05-03 DIAGNOSIS — L270 Generalized skin eruption due to drugs and medicaments taken internally: Secondary | ICD-10-CM | POA: Diagnosis not present

## 2014-05-03 LAB — CBC
HCT: 40.4 % (ref 35.0–47.0)
HGB: 13.4 g/dL (ref 12.0–16.0)
MCH: 31.1 pg (ref 26.0–34.0)
MCHC: 33 g/dL (ref 32.0–36.0)
MCV: 94 fL (ref 80–100)
Platelet: 131 10*3/uL — ABNORMAL LOW (ref 150–440)
RBC: 4.29 10*6/uL (ref 3.80–5.20)
RDW: 14.8 % — ABNORMAL HIGH (ref 11.5–14.5)
WBC: 6.6 10*3/uL (ref 3.6–11.0)

## 2014-05-03 LAB — BASIC METABOLIC PANEL
Anion Gap: 7 (ref 7–16)
BUN: 11 mg/dL (ref 7–18)
CHLORIDE: 103 mmol/L (ref 98–107)
Calcium, Total: 9 mg/dL (ref 8.5–10.1)
Co2: 28 mmol/L (ref 21–32)
Creatinine: 0.83 mg/dL (ref 0.60–1.30)
EGFR (Non-African Amer.): >60
Glucose: 122 mg/dL — ABNORMAL HIGH (ref 65–99)
Osmolality: 276 (ref 275–301)
POTASSIUM: 3.9 mmol/L (ref 3.5–5.1)
Sodium: 138 mmol/L (ref 136–145)

## 2014-05-05 NOTE — Telephone Encounter (Signed)
Attempted to contact pt; line busy

## 2014-05-05 NOTE — Telephone Encounter (Signed)
Lm on pts vm requesting a call back

## 2014-05-05 NOTE — Telephone Encounter (Addendum)
Pt left v/m; pt thinks had reaction to Doxycycline;pt request new antibiotic to [facility]; tried to reach pt at all contact #s without success to get additional information.

## 2014-05-05 NOTE — Telephone Encounter (Signed)
Pt came into office this afternoon. She states that she is "feeling fine" but her face is still red, around eyes and on cheeks. Records have been requested from [facility]. Advised pt you were awaiting notes and we will contact her should any additional action need to occur

## 2014-05-05 NOTE — Telephone Encounter (Signed)
Doxycyline is treatment of choice if we think she has a tick borne illness.  How is she feeling now?  She may not need further abx.  Please also get copy of ER notes for me to review.

## 2014-05-05 NOTE — Telephone Encounter (Addendum)
On 04/30/14 Doxycycline caused face to be red and swollen;no trouble breathing; still red at eyebrows and cheeks. Pt though would go away but when symptoms continued pt was [facility] ED on 05/03/14 and was advised to stop Doxycycline and contact PCP for different antibiotic.[facility]. Pt request [phone].

## 2014-05-05 NOTE — Telephone Encounter (Signed)
Please call pt to get more information.

## 2014-05-06 NOTE — Telephone Encounter (Signed)
There really is no other antibiotic I would recommend at this point without re evaluating her.  Her symptoms currently seem more like they are an allergic rxn not a tick born illness so antibiotic not warranted based on this information without seeing her.

## 2014-05-06 NOTE — Telephone Encounter (Signed)
Pt left v/m requesting cb; pt wants to know if different abx is going to be sent to [facility].

## 2014-05-06 NOTE — Telephone Encounter (Signed)
Spoke to pt and advised per Dr Deborra Medina. Pt verbally expressed understanding and states that she will "wait and see if it clears on its own," and if not, by the beginning of next week, she will call and schedule an OV

## 2014-05-15 ENCOUNTER — Other Ambulatory Visit: Payer: Self-pay | Admitting: *Deleted

## 2014-05-15 DIAGNOSIS — R911 Solitary pulmonary nodule: Secondary | ICD-10-CM

## 2014-05-16 ENCOUNTER — Other Ambulatory Visit: Payer: Self-pay | Admitting: Family Medicine

## 2014-05-19 ENCOUNTER — Other Ambulatory Visit: Payer: Self-pay | Admitting: *Deleted

## 2014-05-19 MED ORDER — TRAMADOL HCL 50 MG PO TABS: ORAL_TABLET | ORAL | Status: DC

## 2014-05-19 MED ORDER — ONETOUCH DELICA LANCETS FINE MISC: Status: DC

## 2014-05-19 NOTE — Telephone Encounter (Signed)
#   120x0 last filled 04/14/14.

## 2014-05-22 DIAGNOSIS — F312 Bipolar disorder, current episode manic severe with psychotic features: Secondary | ICD-10-CM | POA: Diagnosis not present

## 2014-06-02 ENCOUNTER — Telehealth: Payer: Self-pay | Admitting: Family Medicine

## 2014-06-02 NOTE — Telephone Encounter (Signed)
Pt called checking on her lab results from [facility]  She has this done  2 weeks ago She wanted know if she needed to change her meds. Pt stated she will drop of the results

## 2014-06-03 ENCOUNTER — Other Ambulatory Visit: Payer: Self-pay | Admitting: Family Medicine

## 2014-06-03 ENCOUNTER — Other Ambulatory Visit: Payer: Self-pay | Admitting: Pulmonary Disease

## 2014-06-03 ENCOUNTER — Other Ambulatory Visit: Payer: Self-pay

## 2014-06-03 MED ORDER — GLUCOSE BLOOD VI STRP: ORAL_STRIP | Status: DC

## 2014-06-03 NOTE — Telephone Encounter (Signed)
I do not see a note where her anxiety was specifically addressed in the last year. Will she run out of her medication before her appt with Dr. Deborra Medina on 06/10/13?

## 2014-06-03 NOTE — Telephone Encounter (Signed)
Pt left v/m requesting refill clonazepam and diazepam to [facility]. Pt last seen sick visit 04/21/14; pt has appt with Dr Deborra Medina on 06/09/2014 but pt request refill this week. Please advise.

## 2014-06-03 NOTE — Telephone Encounter (Signed)
Will look for results in mine or Dr. Hulen Shouts box

## 2014-06-03 NOTE — Telephone Encounter (Signed)
Attempted to contact pt; line busy

## 2014-06-03 NOTE — Telephone Encounter (Signed)
Pt request refill test strips [facility]; pt has appt next week. Refill done.

## 2014-06-04 ENCOUNTER — Telehealth: Payer: Self-pay

## 2014-06-04 NOTE — Telephone Encounter (Signed)
Ok to send in requested diabetic supplies

## 2014-06-04 NOTE — Telephone Encounter (Addendum)
Pt left v/m; ins will approve roche accu-chek nano aviva. Pt request new rx for accu chek nano aviva  Meter, test strips and lancets.Please advise. [facility]. Pt request cb when done.

## 2014-06-04 NOTE — Telephone Encounter (Signed)
Pt left v/m; pt spoke with pharmacy and diazepam and clonazepam has not been filled. Pt request cb with status of refills.

## 2014-06-04 NOTE — Telephone Encounter (Signed)
Pt checked with pharmacy and pts ins will no longer cover one touch test strips. Pt will ck with ins co to see which diabetic meter, test strips and lancets ins will cover and pt will cb with info. Pt said she still has a few test strips left; FBS averaging 149 and after eating BS over 200. Pt has appt 06/09/14 to go over labs.

## 2014-06-05 NOTE — Telephone Encounter (Signed)
No lab results found

## 2014-06-09 ENCOUNTER — Ambulatory Visit: Payer: Medicare Other | Admitting: Family Medicine

## 2014-06-09 ENCOUNTER — Encounter: Payer: Self-pay | Admitting: Family Medicine

## 2014-06-09 ENCOUNTER — Ambulatory Visit (INDEPENDENT_AMBULATORY_CARE_PROVIDER_SITE_OTHER): Payer: Commercial Managed Care - HMO | Admitting: Family Medicine

## 2014-06-09 VITALS — BP 138/84 | HR 84 | Temp 97.8°F | Wt 220.5 lb

## 2014-06-09 DIAGNOSIS — K7460 Unspecified cirrhosis of liver: Secondary | ICD-10-CM

## 2014-06-09 DIAGNOSIS — K7581 Nonalcoholic steatohepatitis (NASH): Secondary | ICD-10-CM

## 2014-06-09 DIAGNOSIS — D696 Thrombocytopenia, unspecified: Secondary | ICD-10-CM | POA: Insufficient documentation

## 2014-06-09 DIAGNOSIS — K7469 Other cirrhosis of liver: Secondary | ICD-10-CM

## 2014-06-09 DIAGNOSIS — E118 Type 2 diabetes mellitus with unspecified complications: Secondary | ICD-10-CM

## 2014-06-09 MED ORDER — DIAZEPAM 5 MG PO TABS: ORAL_TABLET | ORAL | Status: DC

## 2014-06-09 MED ORDER — ZOLPIDEM TARTRATE 5 MG PO TABS: ORAL_TABLET | ORAL | Status: DC

## 2014-06-09 MED ORDER — CLONAZEPAM 0.5 MG PO TABS: ORAL_TABLET | ORAL | Status: DC

## 2014-06-09 NOTE — Telephone Encounter (Signed)
Rx called in to requested pharmacy 

## 2014-06-09 NOTE — Progress Notes (Signed)
Subjective:   Patient ID: Stephanie [name], female    DOB: [birth year], 64 y.o.   MRN: [id number]  Stephanie [name] is a pleasant 64 y.o. year old female who presents to clinic today with Follow-up  on 06/09/2014  HPI: Had labs drawn by psychiatry which she brings with her today, drawn 05/22/14.  Psychiatrist advised her to follow up these labs with me here today.  Elevated a1c- a1c 6.3 but it was 6.5 in 10/2013.  She is taking Metformin 500 mg twice daily.  Elevated liver function-  Alk phos 192, AST 53- stable from prior. Lab Results  Component Value Date   ALT 30 12/12/2013   AST 46* 12/12/2013   ALKPHOS 128* 12/12/2013   BILITOT 0.6 12/12/2013   Platelets a little low- 136. She denies any abnormal bleeding. Lab Results  Component Value Date   WBC 6.7 12/12/2013   HGB 12.4 12/12/2013   HCT 37.1 12/12/2013   MCV 89.4 12/12/2013   PLT 158.0 12/12/2013   Current Outpatient Prescriptions on File Prior to Visit  Medication Sig Dispense Refill  . ABILIFY 30 MG tablet TAKE 1 TABLET BY MOUTH EVERY MORNING 30 tablet 0  . albuterol (PROAIR HFA) 108 (90 BASE) MCG/ACT inhaler INHALE 2 PUFFS INTO THE LUNGS EVERY 6 (SIX) HOURS AS NEEDED FOR WHEEZING. 8.5 each 3  . budesonide-formoterol (SYMBICORT) 160-4.5 MCG/ACT inhaler Inhale 2 puffs into the lungs 2 (two) times daily. 1 Inhaler 3  . clonazePAM (KLONOPIN) 0.5 MG tablet TAKE 1 TABET BY MOUTH 2 TIMES A DAY AS NEEDED FOR ANXIETY 60 tablet 0  . diazepam (VALIUM) 5 MG tablet TAKE 1 TABLET BY MOUTH 2 TIMES A DAY AS NEEDED FOR MUSCLE SPASMS 60 tablet 0  . docusate sodium ([facility] STOOL SOFTENER) 100 MG capsule TAKE 1 CAPSULE BY MOUTH 2 TIMES DAILY AS NEEDED FOR CONSTIPATION 60 capsule 3  . doxycycline (VIBRA-TABS) 100 MG tablet Take 1 tablet (100 mg total) by mouth 2 (two) times daily. 20 tablet 0  . estradiol (ALORA) 0.1 MG/24HR patch PLACE 1 PATCH (0.1 MG TOTAL) ONTO THE SKIN 2 (TWO) TIMES A WEEK. 8 patch 3  . ferrous sulfate 325 (65 FE) MG  tablet TAKE 1 TABLET (325 MG TOTAL) BY MOUTH 2 (TWO) TIMES DAILY WITH A MEAL. 60 tablet 3  . glucose blood (ONE TOUCH ULTRA TEST) test strip USE AS DIRECTED TWICE DAILY Dx E11.8. 100 each 0  . lamoTRIgine (LAMICTAL) 100 MG tablet Take 3 tablets by mouth once daily    . lidocaine (LIDODERM) 5 % PLACE 1 PATCH ONTO THE SKIN DAILY. REMOVE & DISCARD PATCH WITHIN 12 HOURS OR AS DIRECTED BY MD 30 patch 0  . metFORMIN (GLUCOPHAGE) 1000 MG tablet Take 0.5 tablets (500 mg total) by mouth 2 (two) times daily with a meal. 180 tablet 3  . mirtazapine (REMERON) 15 MG tablet TAKE 1 TABLET BY MOUTH AT BEDTIME 34 tablet 2  . nystatin (MYCOSTATIN/NYSTOP) 100000 UNIT/GM POWD APPLY TOPICALLY 2 TIMES DAILY AS DIRECTED 60 g 0  . nystatin-triamcinolone ointment (MYCOLOG) APPLY 1 APPLICATION TOPICALLY 2 (TWO) TIMES DAILY. 30 g 0  . omeprazole (PRILOSEC) 20 MG capsule Take 1 capsule (20 mg total) by mouth 2 (two) times daily before a meal. 60 capsule 3  . ONETOUCH DELICA LANCETS FINE MISC USE AS DIRECTED TWICE DAILY Dx: E11.8 100 each 0  . oxyCODONE-acetaminophen (ROXICET) 5-325 MG per tablet Take 1 tablet by mouth every 8 (eight) hours as needed for severe pain.  20 tablet 0  . promethazine (PHENERGAN) 25 MG tablet TAKE 1/2 TO 1 TABLET BY MOUTH EVERY 8 HOURS AS NEEDED FOR NAUSEA 30 tablet 2  . rOPINIRole (REQUIP) 3 MG tablet TAKE 1 TABLET BY MOUTH AT BEDTIME 90 tablet 0  . salicylic acid 6 % gel APPLY TO AFFECTED AREA EVERY DAY *USE AT NIGHT AND RINSE OFF IN THE MORNING* 40 g 0  . simvastatin (ZOCOR) 10 MG tablet Take 1 tablet (10 mg total) by mouth at bedtime. 90 tablet 3  . Spacer/Aero-Holding Chambers (AEROCHAMBER Z-STAT PLUS) inhaler Use as instructed 1 each 0  . tolterodine (DETROL LA) 2 MG 24 hr capsule TAKE ONE CAPSULE BY MOUTH DAILY 90 capsule 0  . traMADol (ULTRAM) 50 MG tablet TAKE 2 TABLETS BY MOUTH EVERY MORNING AND EVENING AS NEEDED 120 tablet 0  . urea (CARMOL) 40 % CREA Apply nightly to feet    . vitamin D,  CHOLECALCIFEROL, 400 UNITS tablet Take two by mouth daily 6 tablet 6   No current facility-administered medications on file prior to visit.    Allergies  Allergen Reactions  . Doxycycline Other (See Comments)    Face red and swollen; no difficulty breathing    Past Medical History  Diagnosis Date  . Adenomatous colon polyp   . Insomnia   . RLS (restless legs syndrome)   . Colon polyp   . Incontinence   . Hepatitis B, chronic   . H/O: rheumatic fever   . Hyperlipidemia   . Unspecified disorders of nervous system   . Bipolar affective disorder     h/o  . H/O: CVA (cardiovascular accident)     TIA  . Fatty liver 04/09/08    found in abd CT  . Shortness of breath   . Obstructive sleep apnea     not using CPAP, last sleep study 2.5 years ago, per pt doctor is aware  . Blood transfusion 2000  . GERD (gastroesophageal reflux disease)   . Arthritis   . Aortic dissection     Type 1  . Asthma   . Anxiety   . Depression   . Stroke     TIA- 2002  . Ulcer   . Diabetes mellitus without complication   . Anemia   . Blood transfusion without reported diagnosis   . Sleep apnea     Past Surgical History  Procedure Laterality Date  . Back surgery      x 45  . Cholecystectomy    . Rotator cuff repair      left  . Abdominal hysterectomy      total  . Lumbar fusion  10/09  . Hemorroidectomy      and colon polyp removed  . Abdominal exploration surgery    . Posterior cervical fusion/foraminotomy    . Eye surgery      bilat  . Lumbar wound debridement  05/09/2011    Procedure: LUMBAR WOUND DEBRIDEMENT;  Surgeon: Dahlia Bailiff;  Location: [facility];  Service: Orthopedics;  Laterality: N/A;  IRRIGATION AND DEBRIDEMENT SPINAL WOUND  . Axillary artery cannulation via 8-mm hemashield graft, median sternotomy, extracorporeal circulation with deep hypothermic circulatory arrest, repair of  aortic dessection  01/23/2009    Dr Arlyce Dice  . Cataract      both eyes  . Colonoscopy    .  Liver biopsy  04/10/2012    Procedure: LIVER BIOPSY;  Surgeon: Inda Castle, MD;  Location: [facility];  Service: Endoscopy;  Laterality:  N/A;  ultrasound to mark order for abd limited/liver to be marked to be sent by [email or web address]     Family History  Problem Relation Age of Onset  . Colon cancer Neg Hx   . Esophageal cancer Neg Hx   . Rectal cancer Neg Hx   . Breast cancer Mother   . Lung cancer Mother   . Stomach cancer Father   . Diabetes      4 aunts, and 1 uncle    History   Social History  . Marital Status: Divorced    Spouse Name: N/A    Number of Children: 1  . Years of Education: N/A   Occupational History  . Disabled    Social History Main Topics  . Smoking status: Former Smoker -- 0.50 packs/day for 50 years    Types: Cigarettes    Quit date: 04/06/2013  . Smokeless tobacco: Never Used  . Alcohol Use: No  . Drug Use: No  . Sexual Activity: Not on file   Other Topics Concern  . Not on file   Social History Narrative   1 son, 71+ y/o   Daily caffeine use: 2 cups daily   Does not get regular exercise   Disability due to bipolar      Has a living will- she is a DNR (she has form at home per pt).   The PMH, PSH, Social History, Family History, Medications, and allergies have been reviewed in [facility], and have been updated if relevant.   Review of Systems  Constitutional: Negative.   HENT: Negative.   Respiratory: Negative.   Cardiovascular: Negative.   Gastrointestinal: Negative.   Musculoskeletal: Negative.   Skin: Negative.   Hematological: Negative.   Psychiatric/Behavioral: Negative.   All other systems reviewed and are negative.      Objective:    BP 138/84 mmHg  Pulse 84  Temp(Src) 97.8 F (36.6 C) (Oral)  Wt 220 lb 8 oz (100.018 kg)  SpO2 96%   Physical Exam  Constitutional: She is oriented to person, place, and time. She appears well-developed and well-nourished. No distress.  HENT:  Head: Normocephalic.  Cardiovascular:  Normal rate.   Pulmonary/Chest: Effort normal.  Abdominal: Soft.  Musculoskeletal: Normal range of motion.  Neurological: She is alert and oriented to person, place, and time. No cranial nerve deficit.  Skin: Skin is warm and dry.  Psychiatric: She has a normal mood and affect. Her behavior is normal. Judgment and thought content normal.  Nursing note and vitals reviewed.         Assessment & Plan:   Other cirrhosis of liver  Diabetes mellitus type 2, controlled, with complications  Liver cirrhosis secondary to NASH No Follow-up on file.

## 2014-06-09 NOTE — Assessment & Plan Note (Signed)
New without overt bleeding. Likely due to chronic liver disease and chronic meds. Continue to monitor. No further work up at this time. The patient indicates understanding of these issues and agrees with the plan.

## 2014-06-09 NOTE — Assessment & Plan Note (Signed)
a1c actually improved.  No changes made and reassurance provided.

## 2014-06-09 NOTE — Progress Notes (Signed)
Pre visit review using our clinic review tool, if applicable. No additional management support is needed unless otherwise documented below in the visit note. 

## 2014-06-09 NOTE — Assessment & Plan Note (Signed)
Reassurance provided Liver function essentially stable without new symptoms. No further work up needed, continue to follow LFTs every 6-12 months. The patient indicates understanding of these issues and agrees with the plan.

## 2014-06-11 MED ORDER — GLUCOSE BLOOD VI STRP: ORAL_STRIP | Status: DC

## 2014-06-11 MED ORDER — ACCU-CHEK AVIVA DEVI: Status: DC

## 2014-06-11 MED ORDER — ACCU-CHEK SOFT TOUCH LANCETS MISC: Status: DC

## 2014-06-11 NOTE — Addendum Note (Signed)
Addended by: Modena Nunnery on: 06/11/2014 04:39 PM   Modules accepted: Orders

## 2014-06-11 NOTE — Telephone Encounter (Signed)
Pt left v/m requesting diabetic supplies sent to [facility] mail order pharmacy; pt is out of diabetic supplies and pt said [employer] had faxed request x 2 for diabetic supplies; Pt request cb when done.

## 2014-06-12 ENCOUNTER — Telehealth: Payer: Self-pay | Admitting: Gastroenterology

## 2014-06-12 ENCOUNTER — Other Ambulatory Visit: Payer: Self-pay | Admitting: *Deleted

## 2014-06-12 ENCOUNTER — Encounter: Payer: Self-pay | Admitting: Gastroenterology

## 2014-06-12 ENCOUNTER — Other Ambulatory Visit (INDEPENDENT_AMBULATORY_CARE_PROVIDER_SITE_OTHER): Payer: Commercial Managed Care - HMO

## 2014-06-12 ENCOUNTER — Ambulatory Visit (INDEPENDENT_AMBULATORY_CARE_PROVIDER_SITE_OTHER): Payer: Commercial Managed Care - HMO | Admitting: Gastroenterology

## 2014-06-12 VITALS — BP 142/78 | HR 84 | Ht 61.5 in | Wt 220.4 lb

## 2014-06-12 DIAGNOSIS — K7460 Unspecified cirrhosis of liver: Secondary | ICD-10-CM

## 2014-06-12 DIAGNOSIS — R1013 Epigastric pain: Secondary | ICD-10-CM

## 2014-06-12 DIAGNOSIS — G8929 Other chronic pain: Secondary | ICD-10-CM

## 2014-06-12 DIAGNOSIS — K7581 Nonalcoholic steatohepatitis (NASH): Secondary | ICD-10-CM

## 2014-06-12 LAB — COMPREHENSIVE METABOLIC PANEL
ALK PHOS: 147 U/L — AB (ref 39–117)
ALT: 25 U/L (ref 0–35)
AST: 44 U/L — ABNORMAL HIGH (ref 0–37)
Albumin: 3.6 g/dL (ref 3.5–5.2)
BILIRUBIN TOTAL: 0.6 mg/dL (ref 0.2–1.2)
BUN: 14 mg/dL (ref 6–23)
CHLORIDE: 105 meq/L (ref 96–112)
CO2: 27 meq/L (ref 19–32)
Calcium: 9 mg/dL (ref 8.4–10.5)
Creatinine, Ser: 0.8 mg/dL (ref 0.4–1.2)
GFR: 75.71 mL/min (ref >60.00)
Glucose, Bld: 121 mg/dL — ABNORMAL HIGH (ref 70–99)
Potassium: 4 mEq/L (ref 3.5–5.1)
SODIUM: 139 meq/L (ref 135–145)
TOTAL PROTEIN: 7.1 g/dL (ref 6.0–8.3)

## 2014-06-12 LAB — CBC WITH DIFFERENTIAL/PLATELET
Basophils Absolute: 0.1 10*3/uL (ref 0.0–0.1)
Basophils Relative: 0.9 % (ref 0.0–3.0)
EOS ABS: 0.1 10*3/uL (ref 0.0–0.7)
EOS PCT: 1.2 % (ref 0.0–5.0)
HCT: 39 % (ref 36.0–46.0)
HEMOGLOBIN: 13 g/dL (ref 12.0–15.0)
Lymphocytes Relative: 18.7 % (ref 12.0–46.0)
Lymphs Abs: 1.1 10*3/uL (ref 0.7–4.0)
MCHC: 33.4 g/dL (ref 30.0–36.0)
MCV: 92.5 fl (ref 78.0–100.0)
MONO ABS: 0.5 10*3/uL (ref 0.1–1.0)
Monocytes Relative: 7.8 % (ref 3.0–12.0)
NEUTROS ABS: 4.3 10*3/uL (ref 1.4–7.7)
Neutrophils Relative %: 71.4 % (ref 43.0–77.0)
Platelets: 119 10*3/uL — ABNORMAL LOW (ref 150.0–400.0)
RBC: 4.22 Mil/uL (ref 3.87–5.11)
RDW: 14.7 % (ref 11.5–15.5)
WBC: 6 10*3/uL (ref 4.0–10.5)

## 2014-06-12 LAB — HEPATIC FUNCTION PANEL
ALK PHOS: 145 U/L — AB (ref 39–117)
ALT: 25 U/L (ref 0–35)
AST: 47 U/L — ABNORMAL HIGH (ref 0–37)
Albumin: 3.6 g/dL (ref 3.5–5.2)
BILIRUBIN DIRECT: 0.2 mg/dL (ref 0.0–0.3)
Total Bilirubin: 0.6 mg/dL (ref 0.2–1.2)
Total Protein: 7.2 g/dL (ref 6.0–8.3)

## 2014-06-12 LAB — AMYLASE: AMYLASE: 30 U/L (ref 27–131)

## 2014-06-12 MED ORDER — ROPINIROLE HCL 3 MG PO TABS: ORAL_TABLET | ORAL | Status: DC

## 2014-06-12 MED ORDER — METFORMIN HCL 1000 MG PO TABS: 500.0000 mg | ORAL_TABLET | Freq: Two times a day (BID) | ORAL | Status: DC

## 2014-06-12 MED ORDER — PROMETHAZINE HCL 25 MG PO TABS: ORAL_TABLET | ORAL | Status: DC

## 2014-06-12 NOTE — Assessment & Plan Note (Signed)
Stable hepatic function

## 2014-06-12 NOTE — Assessment & Plan Note (Signed)
Patient has had persistent upper abdominal pain with seemingly independent bouts of nausea and occasional vomiting.  Recent upper endoscopy and gastric emptying scan were unremarkable except for mild gastritis.  Etiology is uncertain.  Recommendations #1 check CBC, amylase and LFTs #2 CT of the abdomen and pelvis

## 2014-06-12 NOTE — Patient Instructions (Signed)
Your physician has requested that you go to the basement for the following lab work before leaving today: LFT AMYLASE  CBC __________________________________________________________________________________________________________________________________________________________________ You have been scheduled for a CT scan of the abdomen and pelvis at [facility] ([address]---this is in the same building as [facility]).   You are scheduled on 06-13-2014 at 8:30 AM. You should arrive 15 minutes prior to your appointment time for registration. Please follow the written instructions below on the day of your exam:  WARNING: IF YOU ARE ALLERGIC TO IODINE/X-RAY DYE, PLEASE NOTIFY RADIOLOGY IMMEDIATELY AT [phone]! YOU WILL BE GIVEN A 13 HOUR PREMEDICATION PREP.  1) Do not eat or drink anything after 4:30 AM (4 hours prior to your test) 2) You have been given 2 bottles of oral contrast to drink. The solution may taste better if refrigerated, but do NOT add ice or any other liquid to this solution. Shake well before drinking.    Drink 1 bottle of contrast @ 6:30 AM (2 hours prior to your exam)  Drink 1 bottle of contrast @ 7:30 AM (1 hour prior to your exam)  You may take any medications as prescribed with a small amount of water except for the following: Metformin, Glucophage, Glucovance, Avandamet, Riomet, Fortamet, Actoplus Met, Janumet, Glumetza or Metaglip. The above medications must be held the day of the exam AND 48 hours after the exam.  The purpose of you drinking the oral contrast is to aid in the visualization of your intestinal tract. The contrast solution may cause some diarrhea. Before your exam is started, you will be given a small amount of fluid to drink. Depending on your individual set of symptoms, you may also receive an intravenous injection of x-ray contrast/dye. Plan on being at [facility] for 30 minutes or long, depending on the type of exam  you are having performed.  This test typically takes 30-45 minutes to complete.  If you have any questions regarding your exam or if you need to reschedule, you may call the CT department at [phone] between the hours of 8:00 am and 5:00 pm, Monday-Friday.  ________________________________________________________________________

## 2014-06-12 NOTE — Progress Notes (Signed)
      History of Present Illness:  Stephanie [name] routine is to complain of intermittent nausea.  He is now having constant upper abdominal pain that begins in Stephanie [name] upper midepigastrium and radiates to both sides.  It is exacerbated by eating.  Upper endoscopy in November, 2015 demonstrated mild gastritis.  Gastric emptying scan was normal.  Last colonoscopy in 2008 was normal.  She claims to be passing either black or charcoal colored stools.  She denies rectal bleeding.  Weight is stable    Review of Systems: Pertinent positive and negative review of systems were noted in the above HPI section. All other review of systems were otherwise negative.    Current Medications, Allergies, Past Medical History, Past Surgical History, Family History and Social History were reviewed in [facility] record  Vital signs were reviewed in today's medical record. Physical Exam: General: Obese female in no acute distress Skin: anicteric Head: Normocephalic and atraumatic Eyes:  sclerae anicteric, EOMI Ears: Normal auditory acuity Mouth: No deformity or lesions Lungs: Clear throughout to auscultation Heart: Regular rate and rhythm; no murmurs, rubs or bruits Abdomen: Soft,  and non distended. No masses, hepatosplenomegaly or hernias noted. Normal Bowel sounds.  Abdomen is diffusely tender in the upper quadrants and midepigastrium.  There is no guarding or rebound. Rectal: No masses.  Stool Hemoccult negative Musculoskeletal: Symmetrical with no gross deformities  Pulses:  Normal pulses noted Extremities: No clubbing, cyanosis, edema or deformities noted Neurological: Alert oriented x 4, grossly nonfocal Psychological:  Alert and cooperative. Normal mood and affect  See Assessment and Plan under Problem List

## 2014-06-12 NOTE — Telephone Encounter (Signed)
Patient notified that the labs were stable and that we will call with the results when Dr. Deatra Ina has reviewed them

## 2014-06-13 ENCOUNTER — Encounter: Payer: Self-pay | Admitting: Family Medicine

## 2014-06-13 ENCOUNTER — Other Ambulatory Visit: Payer: Self-pay

## 2014-06-13 ENCOUNTER — Other Ambulatory Visit: Payer: Self-pay | Admitting: *Deleted

## 2014-06-13 ENCOUNTER — Ambulatory Visit (INDEPENDENT_AMBULATORY_CARE_PROVIDER_SITE_OTHER)
Admission: RE | Admit: 2014-06-13 | Discharge: 2014-06-13 | Disposition: A | Discharge status: Home / Self Care | Payer: Commercial Managed Care - HMO | Source: Ambulatory Visit | Attending: Gastroenterology | Admitting: Gastroenterology

## 2014-06-13 DIAGNOSIS — K7581 Nonalcoholic steatohepatitis (NASH): Secondary | ICD-10-CM

## 2014-06-13 DIAGNOSIS — G8929 Other chronic pain: Secondary | ICD-10-CM

## 2014-06-13 DIAGNOSIS — K7460 Unspecified cirrhosis of liver: Secondary | ICD-10-CM

## 2014-06-13 DIAGNOSIS — R1013 Epigastric pain: Secondary | ICD-10-CM

## 2014-06-13 MED ORDER — IOHEXOL 300 MG/ML  SOLN
100.0000 mL | Freq: Once | INTRAMUSCULAR | Status: AC | PRN
  Administered 2014-06-13: 100 mL via INTRAVENOUS

## 2014-06-13 MED ORDER — HYOSCYAMINE SULFATE ER 0.375 MG PO TB12: 0.3750 mg | ORAL_TABLET | Freq: Two times a day (BID) | ORAL | Status: DC

## 2014-06-13 NOTE — Telephone Encounter (Signed)
Lm on pts vm requesting a call back. Form for DM supplies faxed at original request and sent electronically again on Jan 6. She may need to contact [employer] to verify when they are scheduled to ship to her location and to confirm they are not on back order. I have not received any additional request for supplies

## 2014-06-13 NOTE — Telephone Encounter (Signed)
Pt request cb for diabetic supplies status to [facility]; was diabetic supplies sent at 06/04/14 5:54 pm note?

## 2014-06-13 NOTE — Telephone Encounter (Signed)
Faxed refill request to pharmacy  For omeprazole

## 2014-06-16 ENCOUNTER — Telehealth: Payer: Self-pay | Admitting: Gastroenterology

## 2014-06-16 ENCOUNTER — Other Ambulatory Visit: Payer: Self-pay | Admitting: Family Medicine

## 2014-06-16 NOTE — Telephone Encounter (Signed)
Pt said insurance would cover Methscopolamine 2.5 would be covered [facility]

## 2014-06-16 NOTE — Telephone Encounter (Signed)
Okay 

## 2014-06-16 NOTE — Telephone Encounter (Signed)
Dr Deatra Ina, Is thiis substitute med ok to prescribe    Methscopolamine 2.5    Iv never heard of that

## 2014-06-17 ENCOUNTER — Other Ambulatory Visit: Payer: Self-pay | Admitting: *Deleted

## 2014-06-17 MED ORDER — METHSCOPOLAMINE BROMIDE 2.5 MG PO TABS: 2.5000 mg | ORAL_TABLET | Freq: Three times a day (TID) | ORAL | Status: DC

## 2014-06-17 NOTE — Telephone Encounter (Signed)
Pt requesting medication refill. Pt has not had f/u visits, acute only. pls advise

## 2014-06-17 NOTE — Telephone Encounter (Signed)
Called patient to inform med sent to pharmacy

## 2014-06-18 MED ORDER — TRAMADOL HCL 50 MG PO TABS: ORAL_TABLET | ORAL | Status: DC

## 2014-06-18 NOTE — Telephone Encounter (Signed)
Rx faxed to requested pharmacy

## 2014-06-24 ENCOUNTER — Other Ambulatory Visit: Payer: Self-pay

## 2014-06-24 ENCOUNTER — Ambulatory Visit (INDEPENDENT_AMBULATORY_CARE_PROVIDER_SITE_OTHER): Payer: Commercial Managed Care - HMO | Admitting: Thoracic Surgery (Cardiothoracic Vascular Surgery)

## 2014-06-24 ENCOUNTER — Encounter: Payer: Self-pay | Admitting: Gastroenterology

## 2014-06-24 ENCOUNTER — Other Ambulatory Visit: Payer: Self-pay | Admitting: Family Medicine

## 2014-06-24 ENCOUNTER — Ambulatory Visit
Admission: RE | Admit: 2014-06-24 | Discharge: 2014-06-24 | Disposition: A | Discharge status: Home / Self Care | Payer: Commercial Managed Care - HMO | Source: Ambulatory Visit | Attending: Thoracic Surgery (Cardiothoracic Vascular Surgery) | Admitting: Thoracic Surgery (Cardiothoracic Vascular Surgery)

## 2014-06-24 ENCOUNTER — Encounter: Payer: Self-pay | Admitting: Thoracic Surgery (Cardiothoracic Vascular Surgery)

## 2014-06-24 VITALS — BP 124/70 | HR 100 | Resp 18 | Ht 61.5 in | Wt 220.0 lb

## 2014-06-24 DIAGNOSIS — R911 Solitary pulmonary nodule: Secondary | ICD-10-CM

## 2014-06-24 MED ORDER — NYSTATIN-TRIAMCINOLONE 100000-0.1 UNIT/GM-% EX OINT: TOPICAL_OINTMENT | CUTANEOUS | Status: DC

## 2014-06-24 NOTE — Progress Notes (Signed)
HPI:  [name] returns today for follow-up of multiple lung nodules most notably one in the right apex that this 8 mm groundglass opacity.  She is a 64 year old woman with multiple medical problems including cirrhosis, ascites, COPD, sleep apnea, and bipolar disorder among others.   She was found in October of 2011 to have a small right apical lung nodule. This was followed by Dr. Marlyn [name], who has retired. He recommended one final scan to be done in January. On that scan there was a slight increase in the size of the groundglass opacity in the right apex to 8 mm. so I continued to follow her for that. She was last seen in the office in January 2015. At that time the nodule was stable. She now returns for a 3 year follow-up scan.  She continues to complain of shortness of breath, lack of energy and difficulty sleeping. She is very anxious. She did quit smoking in November 2014. She still has some cravings, but has remained abstinent. She has a chronic cough. Her CT last year showed question of asymmetry in the piriform sinus. She says that she saw an ENT and was told there was nothing to worry about. She complains of pain in her shoulder blades when she takes a deep breath.  Past Medical History  Diagnosis Date  . Adenomatous colon polyp   . Insomnia   . RLS (restless legs syndrome)   . Colon polyp   . Incontinence   . Hepatitis B, chronic   . H/O: rheumatic fever   . Hyperlipidemia   . Unspecified disorders of nervous system   . Bipolar affective disorder     h/o  . H/O: CVA (cardiovascular accident)     TIA  . Fatty liver 04/09/08    found in abd CT  . Shortness of breath   . Obstructive sleep apnea     not using CPAP, last sleep study 2.5 years ago, per pt doctor is aware  . Blood transfusion 2000  . GERD (gastroesophageal reflux disease)   . Arthritis   . Aortic dissection     Type 1  . Asthma   . Anxiety   . Depression   . Stroke     TIA- 2002  . Ulcer   .  Diabetes mellitus without complication   . Anemia   . Blood transfusion without reported diagnosis   . Sleep apnea       Current Outpatient Prescriptions  Medication Sig Dispense Refill  . ABILIFY 30 MG tablet TAKE 1 TABLET BY MOUTH EVERY MORNING 30 tablet 0  . Blood Glucose Monitoring Suppl (ACCU-CHEK AVIVA) device Use as instructed to test blood sugar once daily Dx E11.8 (Patient taking differently: Use as instructed to test blood sugar twice daily) 1 each 0  . budesonide-formoterol (SYMBICORT) 160-4.5 MCG/ACT inhaler Inhale 2 puffs into the lungs 2 (two) times daily. 1 Inhaler 3  . clonazePAM (KLONOPIN) 0.5 MG tablet TAKE 1 TABET BY MOUTH 2 TIMES A DAY AS NEEDED FOR ANXIETY 60 tablet 0  . diazepam (VALIUM) 5 MG tablet TAKE 1 TABLET BY MOUTH 2 TIMES A DAY AS NEEDED FOR MUSCLE SPASMS 60 tablet 0  . estradiol (ALORA) 0.1 MG/24HR patch PLACE 1 PATCH (0.1 MG TOTAL) ONTO THE SKIN 2 (TWO) TIMES A WEEK. 8 patch 3  . glucose blood (ACCU-CHEK AVIVA) test strip Use as instructed to test blood sugar once daily Dx E11.8 100 each 3  . hyoscyamine (LEVBID) 0.375 MG 12  hr tablet Take 1 tablet (0.375 mg total) by mouth 2 (two) times daily. 60 tablet 0  . lamoTRIgine (LAMICTAL) 100 MG tablet Take 3 tablets by mouth once daily    . Lancets (ACCU-CHEK SOFT TOUCH) lancets Use as instructed to test blood sugar once daily Dx E11.8 100 each 3  . lidocaine (LIDODERM) 5 % PLACE 1 PATCH ONTO THE SKIN DAILY. REMOVE & DISCARD PATCH WITHIN 12 HOURS OR AS DIRECTED BY MD 30 patch 0  . metFORMIN (GLUCOPHAGE) 1000 MG tablet Take 0.5 tablets (500 mg total) by mouth 2 (two) times daily with a meal. 180 tablet 0  . methscopolamine (PAMINE) 2.5 MG TABS tablet Take 1 tablet (2.5 mg total) by mouth 4 (four) times daily -  before meals and at bedtime. 120 tablet 3  . mirtazapine (REMERON) 15 MG tablet TAKE 1 TABLET BY MOUTH AT BEDTIME 34 tablet 2  . nystatin (MYCOSTATIN/NYSTOP) 100000 UNIT/GM POWD APPLY TOPICALLY 2 TIMES DAILY AS  DIRECTED 60 g 0  . nystatin-triamcinolone ointment (MYCOLOG) APPLY 1 APPLICATION TOPICALLY 2 (TWO) TIMES DAILY. 30 g 0  . omeprazole (PRILOSEC) 20 MG capsule Take 1 capsule (20 mg total) by mouth 2 (two) times daily before a meal. 60 capsule 3  . ONETOUCH DELICA LANCETS FINE MISC Inject 30 g as directed 2 (two) times daily.   0  . promethazine (PHENERGAN) 25 MG tablet TAKE 1/2 TO 1 TABLET BY MOUTH EVERY 8 HOURS AS NEEDED FOR NAUSEA 90 tablet 2  . rOPINIRole (REQUIP) 3 MG tablet TAKE 1 TABLET BY MOUTH AT BEDTIME 90 tablet 0  . salicylic acid 6 % gel APPLY TO AFFECTED AREA EVERY DAY *USE AT NIGHT AND RINSE OFF IN THE MORNING* 40 g 0  . simvastatin (ZOCOR) 10 MG tablet Take 1 tablet (10 mg total) by mouth at bedtime. 90 tablet 3  . Spacer/Aero-Holding Chambers (AEROCHAMBER Z-STAT PLUS) inhaler Use as instructed 1 each 0  . Spacer/Aero-Holding Chambers (AEROCHAMBER Z-STAT PLUS) inhaler Use as instructed    . tolterodine (DETROL LA) 2 MG 24 hr capsule TAKE ONE CAPSULE BY MOUTH DAILY 90 capsule 0  . traMADol (ULTRAM) 50 MG tablet TAKE 2 TABLETS BY MOUTH EVERY MORNING AND EVENING AS NEEDED 120 tablet 0  . urea (CARMOL) 40 % CREA Apply nightly to feet    . zolpidem (AMBIEN) 5 MG tablet TAKE 1/2 TO 1 TABLET BY MOUTH AT BEDTIME AS NEEDED 30 tablet 0   No current facility-administered medications for this visit.    Physical Exam BP 124/70 mmHg  Pulse 100  Resp 18  Ht 5' 1.5" (1.562 m)  Wt 220 lb (99.791 kg)  BMI 40.90 kg/m2  SpO2 96% Morbidly obese 64 year old woman in no acute distress Alert and oriented 3 No cervical or subclavicular adenopathy Lungs with diminished breath sounds bilaterally, no wheezing Cardiac regular rate and rhythm Abdomen soft nontender  Diagnostic Tests: CT CHEST WITHOUT CONTRAST  TECHNIQUE: Multidetector CT imaging of the chest was performed following the standard protocol without IV contrast.  COMPARISON: CT 07/02/2013  FINDINGS: No axillary or  supraclavicular lymphadenopathy. No mediastinal hilar lymphadenopathy. No pericardial fluid. Coronary artery calcifications are present. Esophagus is normal.  Review of the lung parenchyma demonstrates a ground-glass nodule in the right lung apex measuring is 8 mm not changed from 7 mm on CT of 06/22/2011. Additional pulmonary nodules are present. No pleural fluid.  Limited view of the liver, kidneys, pancreas are unremarkable.  No aggressive osseous lesion.  IMPRESSION: Ground-glass nodule  at the right lung apex stable over 3 year year interval.  Annual surveillance for a minimum of 3 years has been satisfied. Considered continued annual follow-up as clinically indicated.  These recommendations are taken from:Recommendations for the Management of Subsolid Pulmonary Nodules Detected at CT: A Statement from the [facility] 2013; 266:1, [phone].   Electronically Signed  By: Suzy Bouchard M.D.  On: 06/24/2014 10:49   Impression: Stephanie [name] is a 48 year old woman with a long history of tobacco abuse. She has been followed for multiple lung nodules. The most prominent nodule was a groundglass opacity in the right apex measuring 8 mm. This is unchanged over 3 years. I personally reviewed the films and concur that the nodule is unchanged. It can be considered benign.  She does have a heavy smoking history and quit only a little over a year ago. She has at least a 25-pack-year history and probably more than 30. I think she would benefit from continued annual low dose CT screening.  Plan: Return in 1 year with low-dose screening [place] M.D.

## 2014-06-24 NOTE — Telephone Encounter (Signed)
Pt left v/m requesting refill mycolog ointment to [facility]. Pt seen 06/09/2014.

## 2014-06-26 ENCOUNTER — Other Ambulatory Visit: Payer: Self-pay | Admitting: *Deleted

## 2014-06-26 NOTE — Telephone Encounter (Signed)
Spoke to pt and informed her that we have received a fax request for pts Klonopin and Valium. Spoke to pt who states that she does not receive these meds from [facility] and is not needing refills. Request not sent.

## 2014-06-29 ENCOUNTER — Other Ambulatory Visit: Payer: Self-pay | Admitting: Family Medicine

## 2014-06-30 NOTE — Telephone Encounter (Signed)
Received refill request electronically from pharmacy. Last refill by Dr. Deatra Ina shows 20 mg. Is it okay to refill as requested?

## 2014-07-03 ENCOUNTER — Telehealth: Payer: Self-pay | Admitting: *Deleted

## 2014-07-03 ENCOUNTER — Telehealth: Payer: Self-pay

## 2014-07-03 NOTE — Telephone Encounter (Signed)
Ok to send progress notes- including specialists in [place]. Stephanie [name], what is this?

## 2014-07-03 NOTE — Telephone Encounter (Signed)
Pt said she received metformin 1000 mg rx with instructions to take 1 tab bid; pt has been taking 0.5 tab bid . Pt wants to verify what she should be taking. Advised pt med list has metformin 1046m 1/2 tab bid. Pt will take 1/2 tab tonight and request cb on 07/04/14 with further instructions and verification how to take med.

## 2014-07-03 NOTE — Telephone Encounter (Signed)
CMS/Government Audit, Response required, due 07/10/14.   Random audit.  Requests a copy of physician progress notes dated within 6 months prior to 04/01/2014.  Form in your In Box.

## 2014-07-03 NOTE — Telephone Encounter (Signed)
Ok thank you.  I'm not sure what "within 6 month prior to 04/01/14" means.

## 2014-07-03 NOTE — Telephone Encounter (Signed)
will route to PCP

## 2014-07-03 NOTE — Telephone Encounter (Signed)
Scanning and E-mailing forms to [employer] in our coding and compliance office, as well as [facility] in our medical records department.

## 2014-07-04 NOTE — Telephone Encounter (Signed)
Lm on pts vm requesting a call back

## 2014-07-04 NOTE — Telephone Encounter (Signed)
From her last note with PCP it looks like she should be on 552m metformin twice daily - recommend continue 10071m1/2 tab bid.

## 2014-07-07 ENCOUNTER — Other Ambulatory Visit: Payer: Self-pay | Admitting: Family Medicine

## 2014-07-07 NOTE — Telephone Encounter (Signed)
Spoke to pt and advised;verbally expressed understanding

## 2014-07-07 NOTE — Telephone Encounter (Signed)
Lm on pts vm requesting a call back

## 2014-07-08 NOTE — Telephone Encounter (Signed)
Attempted to contact pt; line busy

## 2014-07-08 NOTE — Telephone Encounter (Signed)
Patient returned your call.  Please call her back at [phone].

## 2014-07-08 NOTE — Telephone Encounter (Signed)
Lm on pt vm requesting a call back to confirm that she is wanting Rx sent to local pharmacy. Pt recently advised me that she was switching all meds to mail order

## 2014-07-09 NOTE — Telephone Encounter (Signed)
Last f/u appt 06/2014

## 2014-07-09 NOTE — Telephone Encounter (Signed)
Rx faxed to requested pharmacy

## 2014-07-09 NOTE — Telephone Encounter (Signed)
Spoke to pt who confirms that she has switched back to local pharmacy;mail order too confusing for her.

## 2014-07-21 ENCOUNTER — Other Ambulatory Visit: Payer: Self-pay | Admitting: Family Medicine

## 2014-07-22 NOTE — Telephone Encounter (Signed)
Last filled 04/24/14--last OV 1/16--please advise

## 2014-07-28 ENCOUNTER — Other Ambulatory Visit: Payer: Self-pay | Admitting: Family Medicine

## 2014-07-29 NOTE — Telephone Encounter (Signed)
Last f/u appt 06/2014.

## 2014-07-29 NOTE — Telephone Encounter (Signed)
Rx called in to requested pharmacy 

## 2014-08-01 ENCOUNTER — Ambulatory Visit (INDEPENDENT_AMBULATORY_CARE_PROVIDER_SITE_OTHER): Payer: Commercial Managed Care - HMO | Admitting: Family Medicine

## 2014-08-01 ENCOUNTER — Encounter: Payer: Self-pay | Admitting: Family Medicine

## 2014-08-01 VITALS — BP 130/76 | HR 90 | Temp 97.7°F | Ht 61.5 in | Wt 220.5 lb

## 2014-08-01 DIAGNOSIS — B372 Candidiasis of skin and nail: Secondary | ICD-10-CM

## 2014-08-01 MED ORDER — NYSTATIN 100000 UNIT/GM EX POWD: CUTANEOUS | Status: DC

## 2014-08-01 MED ORDER — FLUCONAZOLE 150 MG PO TABS: 150.0000 mg | ORAL_TABLET | Freq: Every day | ORAL | Status: DC

## 2014-08-01 NOTE — Progress Notes (Signed)
Pre visit review using our clinic review tool, if applicable. No additional management support is needed unless otherwise documented below in the visit note. 

## 2014-08-01 NOTE — Assessment & Plan Note (Signed)
Severe with bleeding and erosions. Unresponsive to current topical treatment.  Increase nystatin powder, complete course of diflucan x 7 days.

## 2014-08-01 NOTE — Progress Notes (Signed)
   Subjective:    Patient ID: Stephanie [name], female    DOB: 04-05-1951, 64 y.o.   MRN: [id number]  HPI  64 year old female  pt of Dr. Hulen Shouts with history of well controlled DM presents to clinic with new onset  Burning under pannus and in left leg groin crease.   Yeasty odor, occ bleeding , soreness. No fever.  No recent antibiotics. No abdominal pain.   She saw similar issue in last year.  His issue has been going on of an on since then.  In last 2 months it is much worse.  Nystatin powder  applied 3 times a day. Also applying cream.. Not sure what kind.  FBS 96-140 Review of Systems  Constitutional: Negative for fever and fatigue.  HENT: Negative for ear pain.   Eyes: Negative for pain.  Respiratory: Negative for chest tightness and shortness of breath.   Cardiovascular: Negative for chest pain, palpitations and leg swelling.  Gastrointestinal: Negative for abdominal pain.  Genitourinary: Negative for dysuria.       Objective:   Physical Exam  Constitutional: Vital signs are normal. She appears well-developed and well-nourished. She is cooperative.  Non-toxic appearance. She does not appear ill. No distress.  HENT:  Head: Normocephalic.  Right Ear: Hearing, tympanic membrane, external ear and ear canal normal. Tympanic membrane is not erythematous, not retracted and not bulging.  Left Ear: Hearing, tympanic membrane, external ear and ear canal normal. Tympanic membrane is not erythematous, not retracted and not bulging.  Nose: No mucosal edema or rhinorrhea. Right sinus exhibits no maxillary sinus tenderness and no frontal sinus tenderness. Left sinus exhibits no maxillary sinus tenderness and no frontal sinus tenderness.  Mouth/Throat: Uvula is midline, oropharynx is clear and moist and mucous membranes are normal.  Eyes: Conjunctivae, EOM and lids are normal. Pupils are equal, round, and reactive to light. Lids are everted and swept, no foreign bodies found.  Neck: Trachea  normal and normal range of motion. Neck supple. Carotid bruit is not present. No thyroid mass and no thyromegaly present.  Cardiovascular: Normal rate, regular rhythm, S1 normal, S2 normal, normal heart sounds, intact distal pulses and normal pulses.  Exam reveals no gallop and no friction rub.   No murmur heard. Pulmonary/Chest: Effort normal and breath sounds normal. No tachypnea. No respiratory distress. She has no decreased breath sounds. She has no wheezes. She has no rhonchi. She has no rales.  Abdominal: Soft. Normal appearance and bowel sounds are normal. There is no tenderness.  Neurological: She is alert.  Skin: Skin is warm, dry and intact. No rash noted.  Beefy red rash in groin folds and under pannus, greatest on left pannus with erosions.  Psychiatric: Her speech is normal and behavior is normal. Judgment and thought content normal. Her mood appears not anxious. Cognition and memory are normal. She does not exhibit a depressed mood.          Assessment & Plan:

## 2014-08-01 NOTE — Patient Instructions (Addendum)
Complete course of diflucan. Hold cholesterol medication while on.  Stop cream. Increase powder application to 4 times a day. Work on low [employer], weight loss and DM control.

## 2014-08-03 ENCOUNTER — Other Ambulatory Visit: Payer: Self-pay | Admitting: Family Medicine

## 2014-08-04 ENCOUNTER — Other Ambulatory Visit: Payer: Self-pay | Admitting: Family Medicine

## 2014-08-04 ENCOUNTER — Ambulatory Visit (INDEPENDENT_AMBULATORY_CARE_PROVIDER_SITE_OTHER): Payer: Commercial Managed Care - HMO | Admitting: Internal Medicine

## 2014-08-04 ENCOUNTER — Encounter: Payer: Self-pay | Admitting: Internal Medicine

## 2014-08-04 VITALS — BP 132/80 | HR 89 | Temp 97.7°F | Wt 217.0 lb

## 2014-08-04 DIAGNOSIS — L03116 Cellulitis of left lower limb: Secondary | ICD-10-CM | POA: Diagnosis not present

## 2014-08-04 DIAGNOSIS — B372 Candidiasis of skin and nail: Secondary | ICD-10-CM | POA: Diagnosis not present

## 2014-08-04 MED ORDER — CEPHALEXIN 500 MG PO CAPS: 500.0000 mg | ORAL_CAPSULE | Freq: Three times a day (TID) | ORAL | Status: DC

## 2014-08-04 NOTE — Patient Instructions (Signed)

## 2014-08-04 NOTE — Progress Notes (Signed)
Subjective:    Patient ID: Stephanie [name], female    DOB: [birth year], 64 y.o.   MRN: [id number]  HPI  Pt presents to the clinic today with c/o worsening pain and odor to left groin. She was seen for the same 08/01/14. She had failed antifungal cream and nystatin powder to the affected area. She was started on Fluconazole 150 mg daily x 1 week. She reports the area is now worse. She can see blisters. When she wipes the area, there is blood on the towel. She reports that the odor is also worse and she has started running low grade fevers, 99.-100.0. She has not tried anything other than what was prescribed.  Review of Systems      Past Medical History  Diagnosis Date  . Adenomatous colon polyp   . Insomnia   . RLS (restless legs syndrome)   . Colon polyp   . Incontinence   . Hepatitis B, chronic   . H/O: rheumatic fever   . Hyperlipidemia   . Unspecified disorders of nervous system   . Bipolar affective disorder     h/o  . H/O: CVA (cardiovascular accident)     TIA  . Fatty liver 04/09/08    found in abd CT  . Shortness of breath   . Obstructive sleep apnea     not using CPAP, last sleep study 2.5 years ago, per pt doctor is aware  . Blood transfusion 2000  . GERD (gastroesophageal reflux disease)   . Arthritis   . Aortic dissection     Type 1  . Asthma   . Anxiety   . Depression   . Stroke     TIA- 2002  . Ulcer   . Diabetes mellitus without complication   . Anemia   . Blood transfusion without reported diagnosis   . Sleep apnea     Current Outpatient Prescriptions  Medication Sig Dispense Refill  . ABILIFY 30 MG tablet TAKE 1 TABLET BY MOUTH EVERY MORNING 30 tablet 0  . Blood Glucose Monitoring Suppl (ACCU-CHEK AVIVA) device Use as instructed to test blood sugar once daily Dx E11.8 (Patient taking differently: Use as instructed to test blood sugar twice daily) 1 each 0  . budesonide-formoterol (SYMBICORT) 160-4.5 MCG/ACT inhaler Inhale 2 puffs into the lungs 2  (two) times daily. 1 Inhaler 3  . clonazePAM (KLONOPIN) 0.5 MG tablet TAKE 1 TABLET BY MOUTH TWICE A DAY 60 tablet 0  . diazepam (VALIUM) 5 MG tablet TAKE 1 TABLET BY MOUTH TWICE A DAY 60 tablet 0  . estradiol (ALORA) 0.1 MG/24HR patch PLACE 1 PATCH (0.1 MG TOTAL) ONTO THE SKIN 2 (TWO) TIMES A WEEK. 8 patch 3  . fluconazole (DIFLUCAN) 150 MG tablet Take 1 tablet (150 mg total) by mouth daily. 7 tablet 0  . glucose blood (ACCU-CHEK AVIVA) test strip Use as instructed to test blood sugar once daily Dx E11.8 100 each 3  . hyoscyamine (LEVBID) 0.375 MG 12 hr tablet Take 1 tablet (0.375 mg total) by mouth 2 (two) times daily. 60 tablet 0  . lamoTRIgine (LAMICTAL) 100 MG tablet Take 3 tablets by mouth once daily    . Lancets (ACCU-CHEK SOFT TOUCH) lancets Use as instructed to test blood sugar once daily Dx E11.8 100 each 3  . lidocaine (LIDODERM) 5 % PLACE 1 PATCH ONTO THE SKIN DAILY. REMOVE & DISCARD PATCH WITHIN 12 HOURS OR AS DIRECTED BY MD 30 patch 2  . metFORMIN (GLUCOPHAGE) 1000 MG  tablet Take 0.5 tablets (500 mg total) by mouth 2 (two) times daily with a meal. 180 tablet 0  . methscopolamine (PAMINE) 2.5 MG TABS tablet Take 1 tablet (2.5 mg total) by mouth 4 (four) times daily -  before meals and at bedtime. 120 tablet 3  . mirtazapine (REMERON) 15 MG tablet TAKE 1 TABLET BY MOUTH AT BEDTIME 34 tablet 2  . nystatin (MYCOSTATIN/NYSTOP) 100000 UNIT/GM POWD Apply to affected area 4 times a day 60 g 1  . nystatin-triamcinolone ointment (MYCOLOG) APPLY 1 APPLICATION TOPICALLY 2 (TWO) TIMES DAILY. 30 g 0  . omeprazole (PRILOSEC) 40 MG capsule TAKE ONE CAPSULE BY MOUTH EVERY DAY 90 capsule 1  . ONETOUCH DELICA LANCETS FINE MISC Inject 30 g as directed 2 (two) times daily.   0  . promethazine (PHENERGAN) 25 MG tablet TAKE 1/2 TO 1 TABLET BY MOUTH EVERY 8 HOURS AS NEEDED FOR NAUSEA 90 tablet 2  . rOPINIRole (REQUIP) 3 MG tablet TAKE 1 TABLET BY MOUTH AT BEDTIME 90 tablet 0  . rOPINIRole (REQUIP) 3 MG  tablet TAKE 1 TABLET BY MOUTH AT BEDTIME (FILL 04/08/14) 90 tablet 0  . salicylic acid 6 % gel APPLY TO AFFECTED AREA EVERY DAY *USE AT NIGHT AND RINSE OFF IN THE MORNING* 40 g 0  . simvastatin (ZOCOR) 10 MG tablet Take 1 tablet (10 mg total) by mouth at bedtime. 90 tablet 3  . Spacer/Aero-Holding Chambers (AEROCHAMBER Z-STAT PLUS) inhaler Use as instructed 1 each 0  . tolterodine (DETROL LA) 2 MG 24 hr capsule TAKE ONE CAPSULE BY MOUTH DAILY 90 capsule 0  . traMADol (ULTRAM) 50 MG tablet TAKE 2 TABLETS BY MOUTH EVERY MORNING AND EVENING AS NEEDED 120 tablet 0  . urea (CARMOL) 40 % CREA Apply nightly to feet    . zolpidem (AMBIEN) 5 MG tablet TAKE ONE-HALF OR ONE TAB AT BEDTIME AS NEEDED 30 tablet 0  . cephALEXin (KEFLEX) 500 MG capsule Take 1 capsule (500 mg total) by mouth 3 (three) times daily. 30 capsule 0   No current facility-administered medications for this visit.    Allergies  Allergen Reactions  . Doxycycline Other (See Comments)    Face red and swollen; no difficulty breathing    Family History  Problem Relation Age of Onset  . Colon cancer Neg Hx   . Esophageal cancer Neg Hx   . Rectal cancer Neg Hx   . Breast cancer Mother   . Lung cancer Mother   . Stomach cancer Father   . Diabetes      4 aunts, and 1 uncle    History   Social History  . Marital Status: Divorced    Spouse Name: N/A  . Number of Children: 1  . Years of Education: N/A   Occupational History  . Disabled    Social History Main Topics  . Smoking status: Former Smoker -- 0.50 packs/day for 50 years    Types: Cigarettes    Quit date: 04/06/2013  . Smokeless tobacco: Never Used  . Alcohol Use: No  . Drug Use: No  . Sexual Activity: Not on file   Other Topics Concern  . Not on file   Social History Narrative   1 son, 34+ y/o   Daily caffeine use: 2 cups daily   Does not get regular exercise   Disability due to bipolar      Has a living will- she is a DNR (she has form at home per pt).  Constitutional: Pt reports fever. Denies malaise, fatigue, headache or abrupt weight changes.  Respiratory: Denies difficulty breathing, shortness of breath, cough or sputum production.   Cardiovascular: Denies chest pain, chest tightness, palpitations or swelling in the hands or feet.  Skin: Pt reports rash under her abdomen and left groin.   No other specific complaints in a complete review of systems (except as listed in HPI above).  Objective:   Physical Exam   BP 132/80 mmHg  Pulse 89  Temp(Src) 97.7 F (36.5 C) (Oral)  Wt 217 lb (98.431 kg)  SpO2 97% Wt Readings from Last 3 Encounters:  08/04/14 217 lb (98.431 kg)  08/01/14 220 lb 8 oz (100.018 kg)  06/24/14 220 lb (99.791 kg)    General: Appears her stated age, obese but in NAD. Skin: Yeast noted under pannus and in left groin. She has a inflamed area surrounding the yeast that extends down mid thigh medially. Rash is warm and very tender to touch. + odor. Cardiovascular: Normal rate and rhythm. S1,S2 noted.  No murmur, rubs or gallops noted.  Pulmonary/Chest: Normal effort and positive vesicular breath sounds. No respiratory distress. No wheezes, rales or ronchi noted.  Neurological: Alert and oriented.   BMET    Component Value Date/Time   NA 139 06/12/2014 1048   K 4.0 06/12/2014 1048   CL 105 06/12/2014 1048   CO2 27 06/12/2014 1048   GLUCOSE 121* 06/12/2014 1048   BUN 14 06/12/2014 1048   CREATININE 0.8 06/12/2014 1048   CALCIUM 9.0 06/12/2014 1048   GFRNONAA 89* 05/09/2011 1158   GFRAA >90 05/09/2011 1158    Lipid Panel     Component Value Date/Time   CHOL 251* 07/22/2013 0853   TRIG 135.0 07/22/2013 0853   HDL 96.50 07/22/2013 0853   CHOLHDL 3 07/22/2013 0853   VLDL 27.0 07/22/2013 0853    CBC    Component Value Date/Time   WBC 6.0 06/12/2014 1048   RBC 4.22 06/12/2014 1048   HGB 13.0 06/12/2014 1048   HCT 39.0 06/12/2014 1048   PLT 119.0* 06/12/2014 1048   MCV 92.5 06/12/2014 1048    MCH 29.9 05/09/2011 1158   MCHC 33.4 06/12/2014 1048   RDW 14.7 06/12/2014 1048   LYMPHSABS 1.1 06/12/2014 1048   MONOABS 0.5 06/12/2014 1048   EOSABS 0.1 06/12/2014 1048   BASOSABS 0.1 06/12/2014 1048    Hgb A1C Lab Results  Component Value Date   HGBA1C 6.5 10/14/2013       Assessment & Plan:   Candida Intertrigo:  Advised her to finish out the Diflucan (she is only on day 3 of 7) Avoid rubbing the area with the towel as this will lead to further irritation. Pat dry Continue the Nystatin powder Given fever, worsening odor, and worsening redness and warmth surrounding the rash, concern for cellulitis- will treat with Keflex 500 mg TID x 10 days Advised her to make a follow up with Dr. Deborra Medina for this Thursday to reasess  RTC as needed or if symptoms persist or worsen

## 2014-08-04 NOTE — Progress Notes (Signed)
Pre visit review using our clinic review tool, if applicable. No additional management support is needed unless otherwise documented below in the visit note. 

## 2014-08-05 NOTE — Telephone Encounter (Signed)
Last f/u appt 06/2014

## 2014-08-05 NOTE — Telephone Encounter (Signed)
Rx called in to requested pharmacy 

## 2014-08-07 ENCOUNTER — Ambulatory Visit: Payer: Commercial Managed Care - HMO | Admitting: Family Medicine

## 2014-08-11 ENCOUNTER — Ambulatory Visit: Payer: Commercial Managed Care - HMO | Admitting: Family Medicine

## 2014-08-13 ENCOUNTER — Ambulatory Visit (AMBULATORY_SURGERY_CENTER): Payer: Self-pay

## 2014-08-13 VITALS — Ht 62.0 in | Wt 218.6 lb

## 2014-08-13 DIAGNOSIS — Z860100 Personal history of colon polyps, unspecified: Secondary | ICD-10-CM

## 2014-08-13 MED ORDER — SUPREP BOWEL PREP KIT 17.5-3.13-1.6 GM/177ML PO SOLN: 1.0000 | Freq: Once | ORAL | Status: DC

## 2014-08-13 NOTE — Progress Notes (Signed)
No allergies to eggs or soy  No diet/weight loss meds No home oxygen No past problems with anesthesia  No email

## 2014-08-14 ENCOUNTER — Other Ambulatory Visit: Payer: Self-pay | Admitting: Family Medicine

## 2014-08-14 ENCOUNTER — Other Ambulatory Visit (INDEPENDENT_AMBULATORY_CARE_PROVIDER_SITE_OTHER): Payer: Commercial Managed Care - HMO

## 2014-08-14 DIAGNOSIS — E118 Type 2 diabetes mellitus with unspecified complications: Secondary | ICD-10-CM

## 2014-08-14 DIAGNOSIS — E785 Hyperlipidemia, unspecified: Secondary | ICD-10-CM | POA: Diagnosis not present

## 2014-08-14 LAB — CBC WITH DIFFERENTIAL/PLATELET
Basophils Absolute: 0 10*3/uL (ref 0.0–0.1)
Basophils Relative: 0.8 % (ref 0.0–3.0)
Eosinophils Absolute: 0.1 10*3/uL (ref 0.0–0.7)
Eosinophils Relative: 2.2 % (ref 0.0–5.0)
HEMATOCRIT: 37.5 % (ref 36.0–46.0)
Hemoglobin: 12.4 g/dL (ref 12.0–15.0)
LYMPHS ABS: 1 10*3/uL (ref 0.7–4.0)
LYMPHS PCT: 23.2 % (ref 12.0–46.0)
MCHC: 33.2 g/dL (ref 30.0–36.0)
MCV: 92.2 fl (ref 78.0–100.0)
MONO ABS: 0.4 10*3/uL (ref 0.1–1.0)
Monocytes Relative: 8.4 % (ref 3.0–12.0)
NEUTROS ABS: 2.9 10*3/uL (ref 1.4–7.7)
Neutrophils Relative %: 65.4 % (ref 43.0–77.0)
Platelets: 116 10*3/uL — ABNORMAL LOW (ref 150.0–400.0)
RBC: 4.06 Mil/uL (ref 3.87–5.11)
RDW: 14.6 % (ref 11.5–15.5)
WBC: 4.4 10*3/uL (ref 4.0–10.5)

## 2014-08-14 LAB — COMPREHENSIVE METABOLIC PANEL
ALT: 24 U/L (ref 0–35)
AST: 37 U/L (ref 0–37)
Albumin: 3.7 g/dL (ref 3.5–5.2)
Alkaline Phosphatase: 174 U/L — ABNORMAL HIGH (ref 39–117)
BUN: 18 mg/dL (ref 6–23)
CO2: 33 mEq/L — ABNORMAL HIGH (ref 19–32)
Calcium: 9.3 mg/dL (ref 8.4–10.5)
Chloride: 104 mEq/L (ref 96–112)
Creatinine, Ser: 0.77 mg/dL (ref 0.40–1.20)
GFR: 80.22 mL/min (ref >60.00)
Glucose, Bld: 150 mg/dL — ABNORMAL HIGH (ref 70–99)
POTASSIUM: 4.1 meq/L (ref 3.5–5.1)
Sodium: 141 mEq/L (ref 135–145)
TOTAL PROTEIN: 6.6 g/dL (ref 6.0–8.3)
Total Bilirubin: 0.5 mg/dL (ref 0.2–1.2)

## 2014-08-14 LAB — HEMOGLOBIN A1C: HEMOGLOBIN A1C: 6.2 % (ref 4.6–6.5)

## 2014-08-14 LAB — LIPID PANEL
CHOL/HDL RATIO: 2
Cholesterol: 210 mg/dL — ABNORMAL HIGH (ref 0–200)
HDL: 88.5 mg/dL (ref >39.00)
LDL CALC: 98 mg/dL (ref 0–99)
NONHDL: 121.5
Triglycerides: 116 mg/dL (ref 0.0–149.0)
VLDL: 23.2 mg/dL (ref 0.0–40.0)

## 2014-08-14 LAB — TSH: TSH: 4.14 u[IU]/mL (ref 0.35–4.50)

## 2014-08-16 IMAGING — CT CT ANGIO CHEST
2 of 6 series · 18 of 36 positions shown · IV contrast (APPLIED)
Comparison: CT chest 04/03/2013. PA and lateral chest earlier this
same day.

CLINICAL DATA: Chest pain and shortness of breath beginning last
night. Fever.

EXAM:
CT ANGIOGRAPHY CHEST WITH CONTRAST
TECHNIQUE: Multidetector CT imaging of the chest was performed using the
standard protocol during bolus administration of intravenous
contrast. Multiplanar CT image reconstructions and MIPs were
obtained to evaluate the vascular anatomy.
CONTRAST:  100 cc Isovue 370.

[Series 5: pe 1.0 thins · axial · 0.62mm/px · z∈[+754,+968]mm · 17 of 243 slices shown]
[im 14/243  lung]
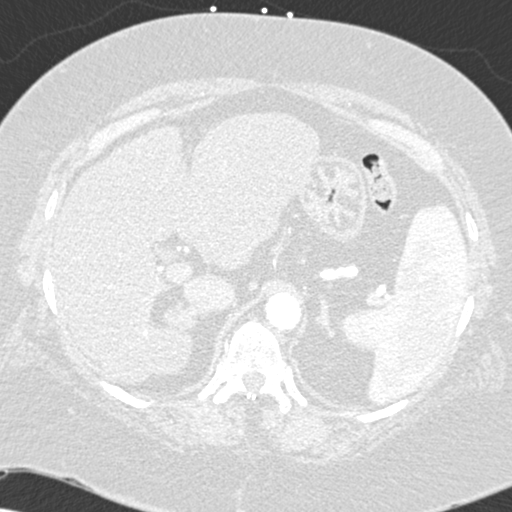
[im 27/243  mediastinal]
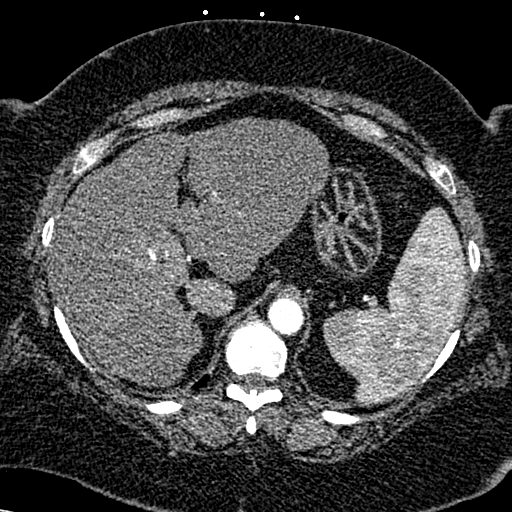
[im 41/243  lung]
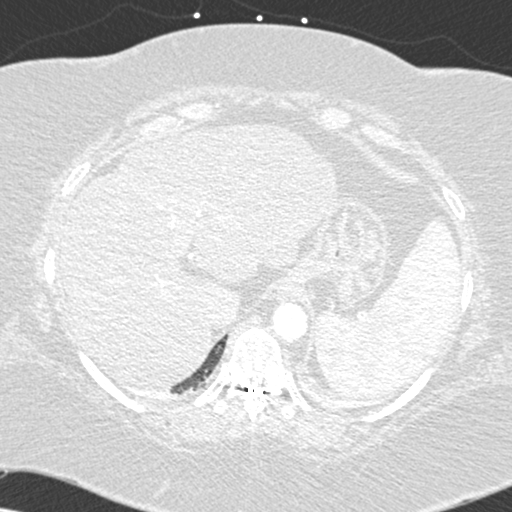
[im 54/243  mediastinal]
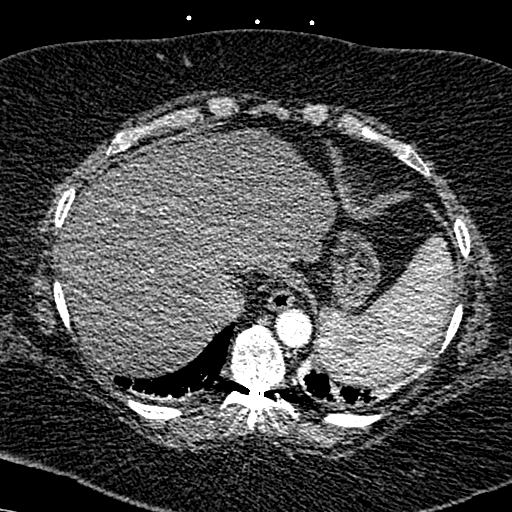
[im 68/243  lung]
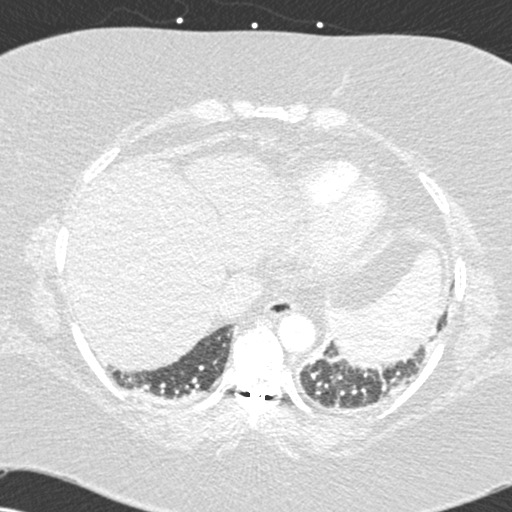
[im 81/243  mediastinal]
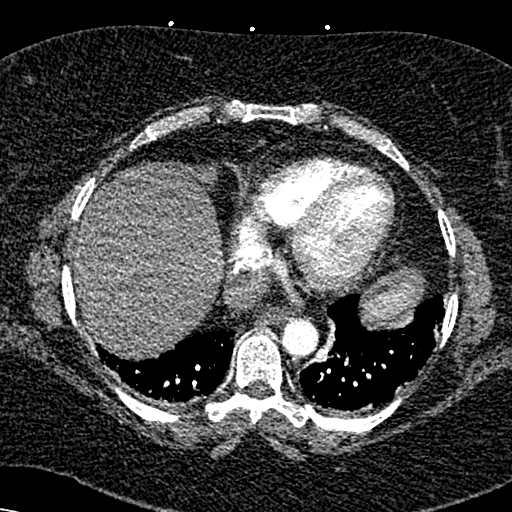
[im 95/243  lung]
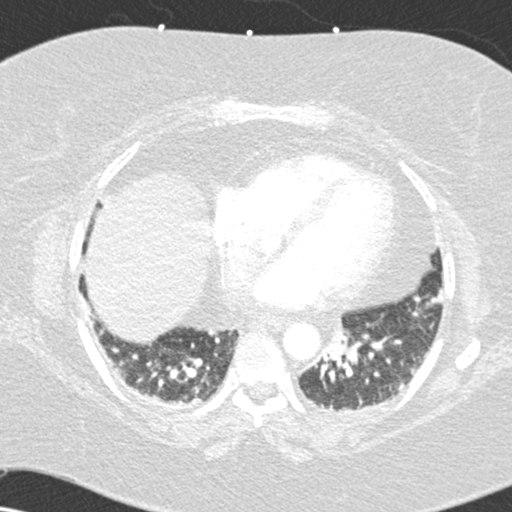
[im 108/243  mediastinal]
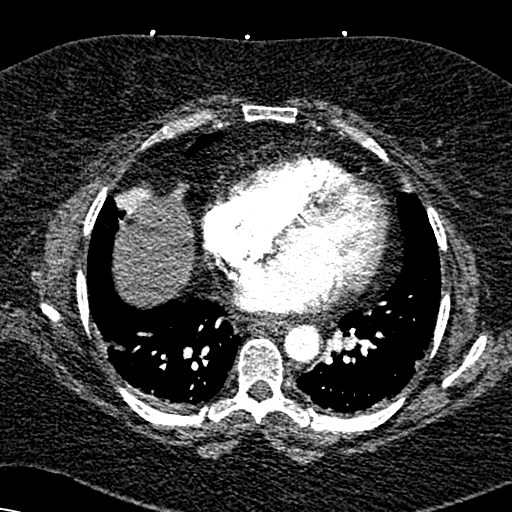
[im 122/243  lung]
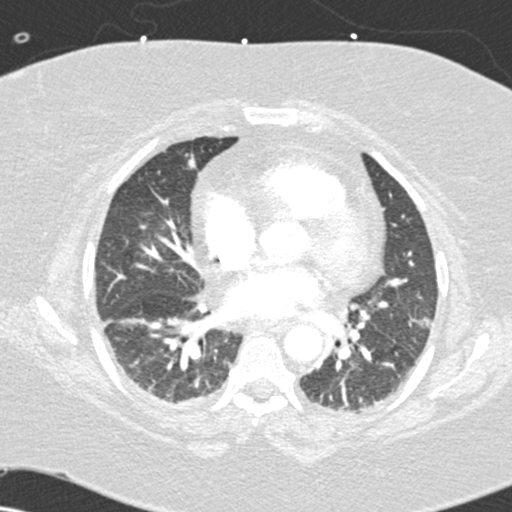
[im 135/243  mediastinal]
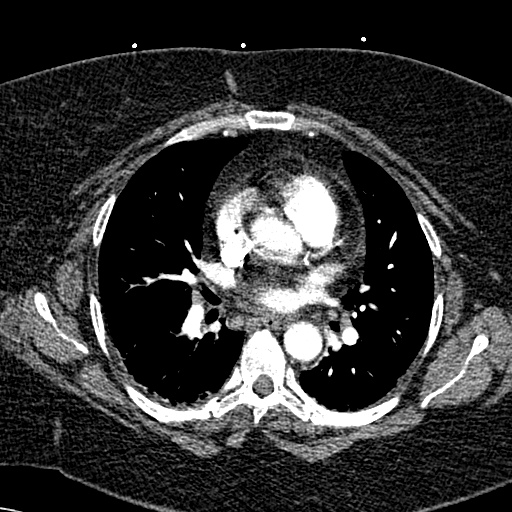
[im 148/243  lung]
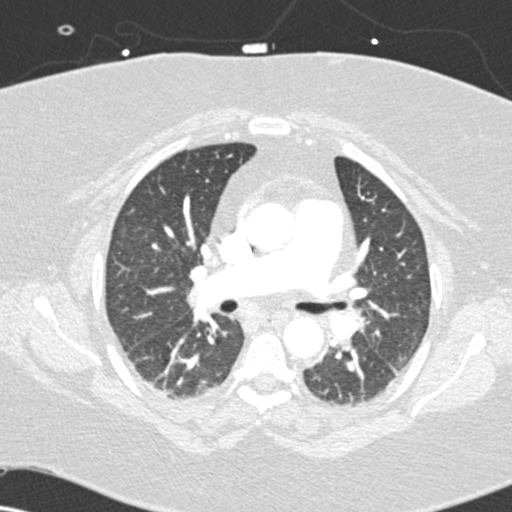
[im 162/243  mediastinal]
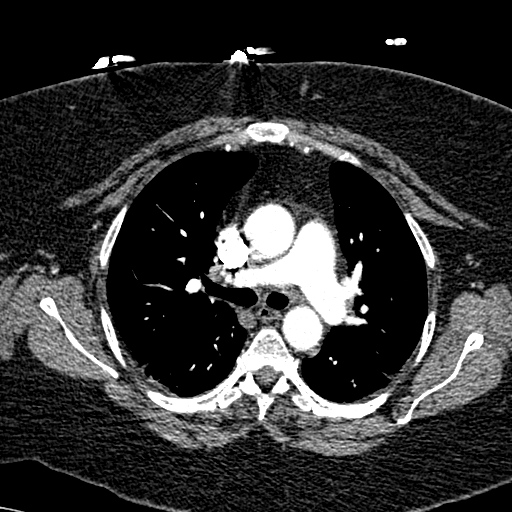
[im 175/243  lung]
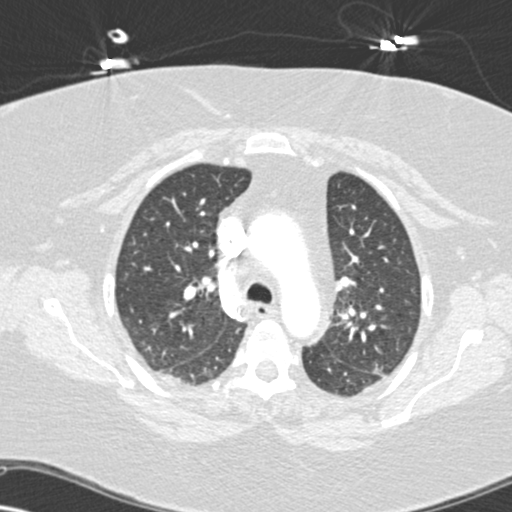
[im 189/243  mediastinal]
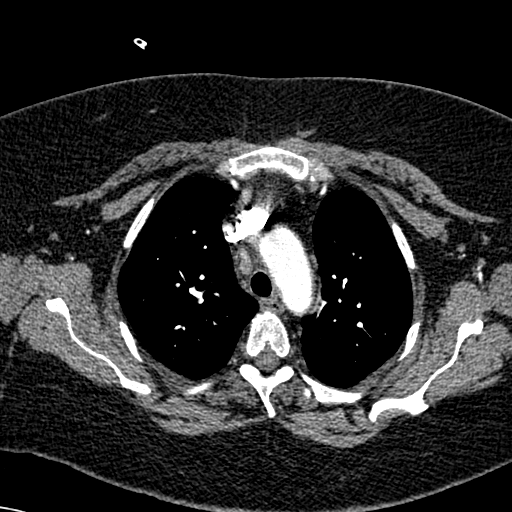
[im 202/243  lung]
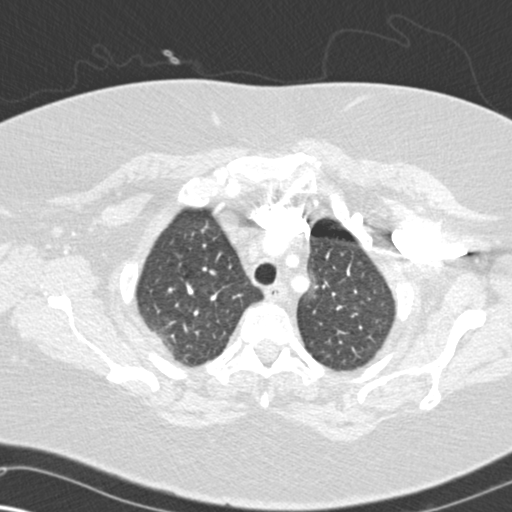
[im 216/243  mediastinal]
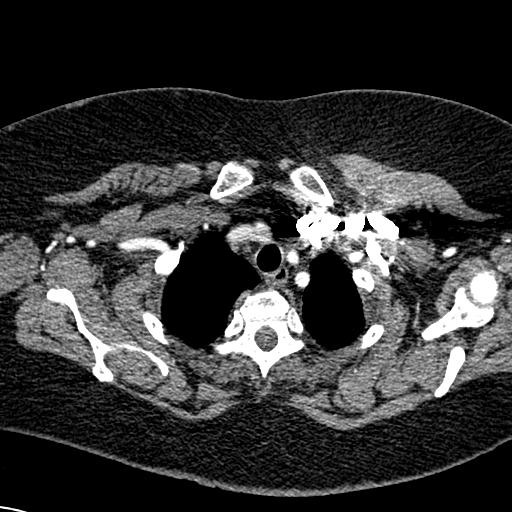
[im 229/243  lung]
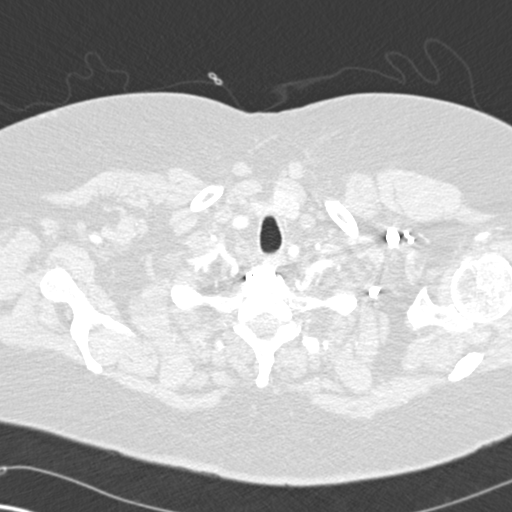

[Series 7: cor pe 2.0 mpr · coronal · 0.49mm/px · 1 of 134 slices shown]
[im 67/134  mediastinal]
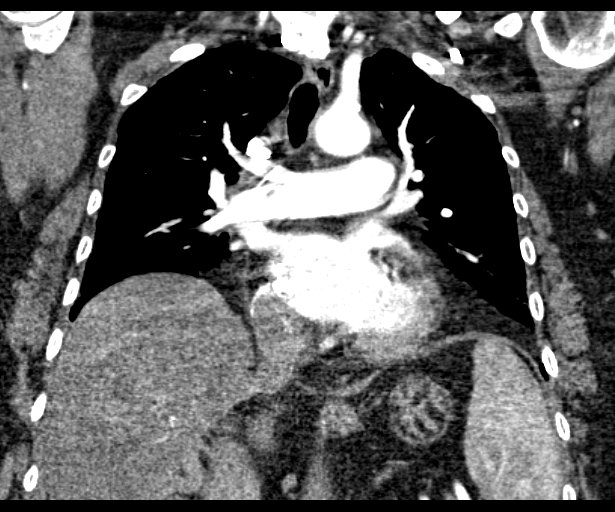

[18 of 36 positions shown; findings below may reference images not displayed]

FINDINGS: No pulmonary embolus is identified. Trace bilateral pleural
effusions are present. There is no pericardial effusion. Heart size
is upper normal. There is no axillary, hilar or mediastinal
lymphadenopathy. The lungs demonstrate only mild basilar
atelectasis. Visualized upper abdomen shows fatty infiltration of
the liver. No focal bony abnormality is seen. The patient is status
post lower cervical fusion. A spinal stimulator is in place.

Review of the MIP images confirms the above findings.
IMPRESSION: Negative for pulmonary embolus. No acute disease or finding to
explain the patient's symptoms. Trace bilateral pleural effusions
noted.

Fatty infiltration of the liver.

## 2014-08-16 IMAGING — CR DG CHEST 2V
1 series · 2 of 2 positions shown · non-contrast
Comparison: 04/04/2013

CLINICAL DATA: Chest pain and fever

EXAM:
CHEST  2 VIEW

[Series 1: w chest pa · 0.14mm/px · 2 of 2 slices shown]
[im 1/2]
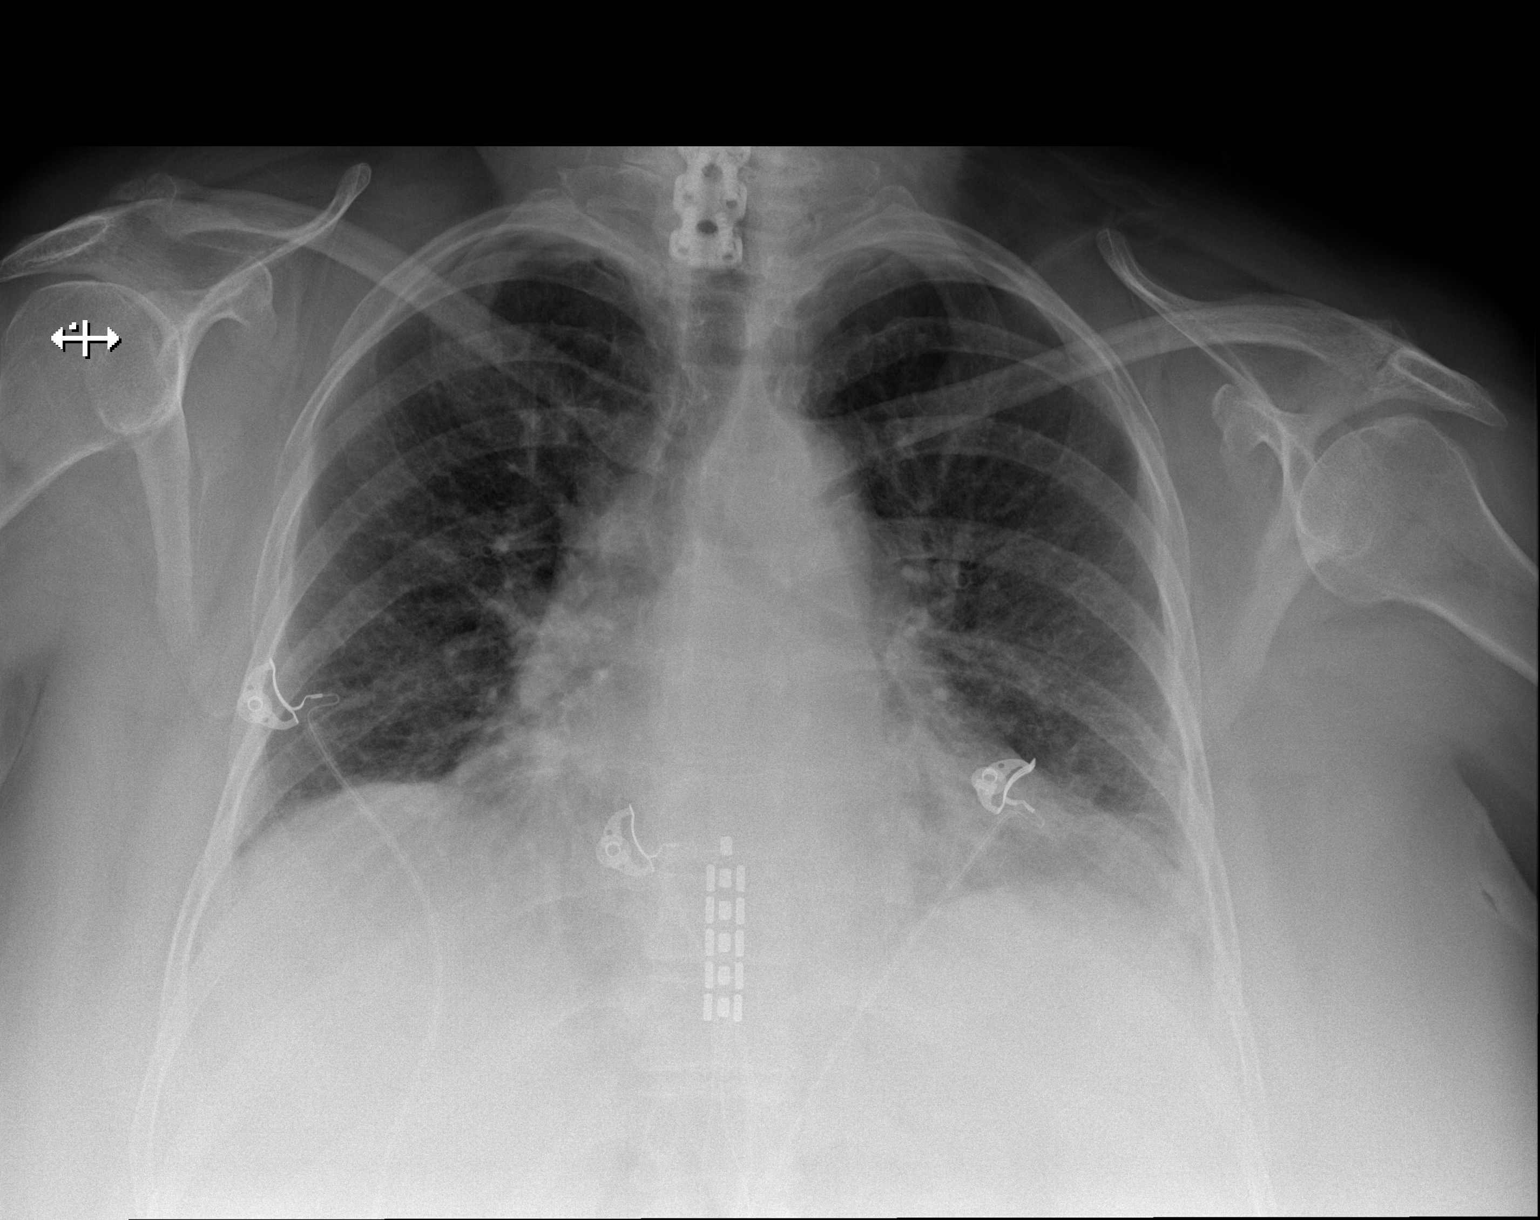
[im 2/2]
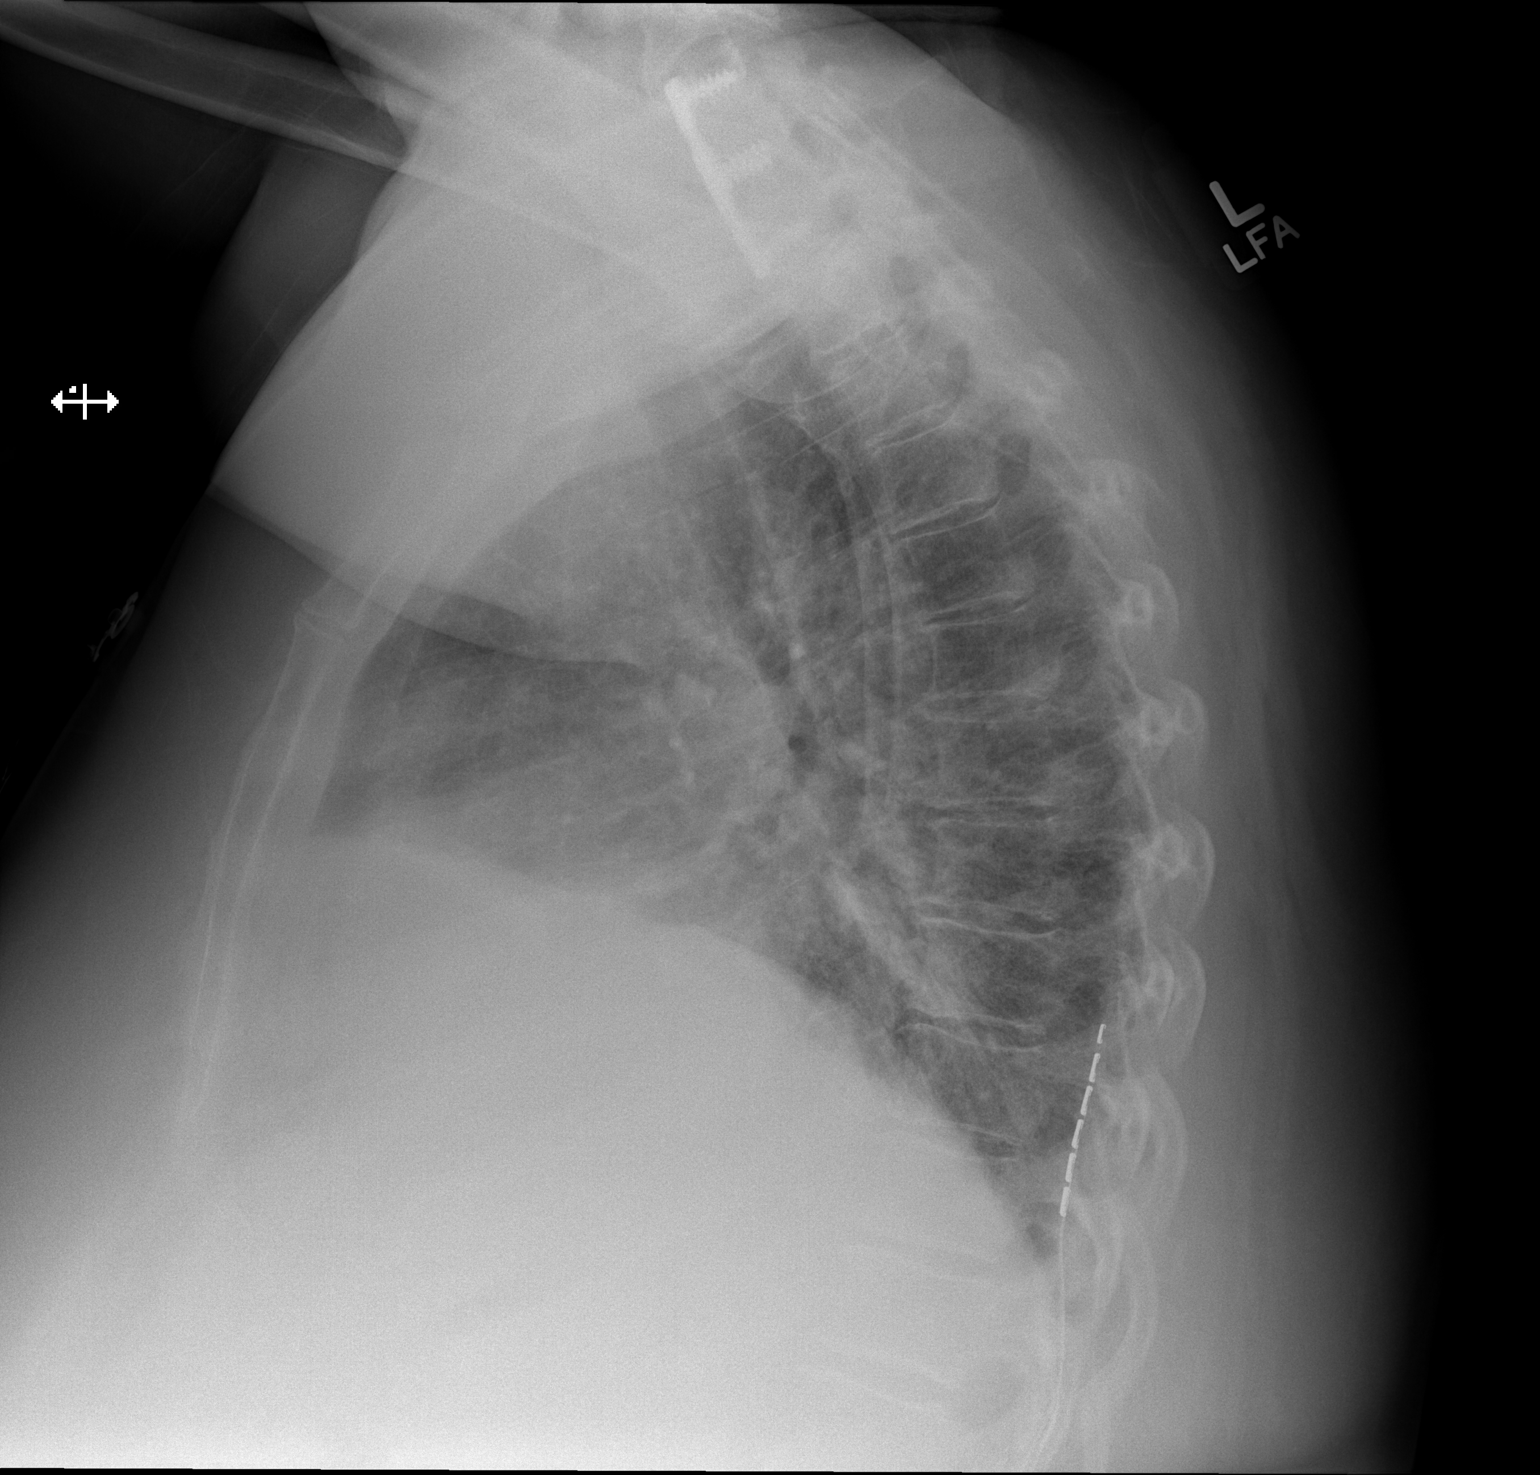

[2 of 2 positions shown; findings below may reference images not displayed]

FINDINGS: Mild cardiac enlargement with mild vascular congestion. No edema or
effusion.

Mild bibasilar atelectasis with improved aeration in the right
middle lobe compared with the prior study. Spinal cord stimulator
unchanged. ACDF in the lower cervical spine.
IMPRESSION: Cardiac enlargement with mild vascular congestion

Mild bibasilar atelectasis.

## 2014-08-19 ENCOUNTER — Ambulatory Visit (INDEPENDENT_AMBULATORY_CARE_PROVIDER_SITE_OTHER): Payer: Commercial Managed Care - HMO | Admitting: Family Medicine

## 2014-08-19 ENCOUNTER — Encounter: Payer: Self-pay | Admitting: Family Medicine

## 2014-08-19 VITALS — BP 122/74 | HR 88 | Temp 97.5°F | Ht 62.25 in | Wt 213.0 lb

## 2014-08-19 DIAGNOSIS — Z66 Do not resuscitate: Secondary | ICD-10-CM | POA: Diagnosis not present

## 2014-08-19 DIAGNOSIS — G5791 Unspecified mononeuropathy of right lower limb: Secondary | ICD-10-CM

## 2014-08-19 DIAGNOSIS — D696 Thrombocytopenia, unspecified: Secondary | ICD-10-CM | POA: Insufficient documentation

## 2014-08-19 DIAGNOSIS — Z0000 Encounter for general adult medical examination without abnormal findings: Secondary | ICD-10-CM

## 2014-08-19 DIAGNOSIS — K7469 Other cirrhosis of liver: Secondary | ICD-10-CM

## 2014-08-19 DIAGNOSIS — L600 Ingrowing nail: Secondary | ICD-10-CM

## 2014-08-19 DIAGNOSIS — I10 Essential (primary) hypertension: Secondary | ICD-10-CM | POA: Diagnosis not present

## 2014-08-19 DIAGNOSIS — E785 Hyperlipidemia, unspecified: Secondary | ICD-10-CM | POA: Diagnosis not present

## 2014-08-19 DIAGNOSIS — G5792 Unspecified mononeuropathy of left lower limb: Secondary | ICD-10-CM

## 2014-08-19 DIAGNOSIS — G5793 Unspecified mononeuropathy of bilateral lower limbs: Secondary | ICD-10-CM

## 2014-08-19 NOTE — Assessment & Plan Note (Signed)
Well controlled.  No changes made.

## 2014-08-19 NOTE — Patient Instructions (Signed)
Good to see you. Try taking Vit B12- 500 mcg to 1000 mcg daily.  We will call you with your blood doctor referral.

## 2014-08-19 NOTE — Assessment & Plan Note (Signed)
Deteriorated. Refer to hematology- likely due to cirrhosis.

## 2014-08-19 NOTE — Progress Notes (Signed)
Pre visit review using our clinic review tool, if applicable. No additional management support is needed unless otherwise documented below in the visit note. 

## 2014-08-19 NOTE — Assessment & Plan Note (Signed)
Improved.  Continue low dose zocor- 10 mg daily. Liver function relatively stable.

## 2014-08-19 NOTE — Progress Notes (Signed)
Subjective:   Patient ID: Stephanie [name], female    DOB: 1950/10/31, 64 y.o.   MRN: [id number]  Stephanie [name] is a pleasant 8 y.o. year old female who presents to clinic today with Annual Exam  on 08/19/2014  HPI: I have personally reviewed the Medicare Annual Wellness questionnaire and have noted 1. The patient's medical and social history 2. Their use of alcohol, tobacco or illicit drugs 3. Their current medications and supplements 4. The patient's functional ability including ADL's, fall risks, home safety risks and hearing or visual             impairment. 5. Diet and physical activities 6. Evidence for depression or mood disorders  End of life wishes discussed and updated in Social History- DNR.   The roster of all physicians providing medical care to patient - is listed in the Snapshot section of the chart.    Colonoscopy 4/1/11Vira Agar, scheduled for colonoscopy with Dr. Deatra Ina on 08/27/14 Zostavax 12/13/2010  Pneumovax 04/01/09 Tdap 12/16/2008 Mammogram 01/13/2014 Remote h/o hysterectomy Influenza 02/07/14 Eye exam scheduled for 09/03/2014  Followed by Dr. Roxan Hockey for multiple lung nodules- last saw him on 06/24/14- note reviewed. He recommended continued annual low dose CT screening- last CT was in 06/2014.  Followed by Dr. Halford Chessman for asthma and OSA- last saw him in 03/2014- note reviewed.  Hyperglycemia-   On Metformin 500 mg XR daily. Lab Results  Component Value Date   HGBA1C 6.2 08/14/2014   Thrombocytopenia- deteriorated.  Denies any bleeding from stool or urine currently.  Did have some black stools last month that have resolved.  Does have colonoscopy scheduled for later this month.  Lab Results  Component Value Date   WBC 4.4 08/14/2014   HGB 12.4 08/14/2014   HCT 37.5 08/14/2014   MCV 92.2 08/14/2014   PLT 116.0 Repeated and verified X2.* 08/14/2014   Lab Results  Component Value Date   ALT 24 08/14/2014   AST 37 08/14/2014   ALKPHOS 174*  08/14/2014   BILITOT 0.5 08/14/2014   Lab Results  Component Value Date   TSH 4.14 08/14/2014   Having more pain in her feet and toe nails have been more painful.  Patient Active Problem List   Diagnosis Date Noted  . Thrombocytopenia 08/19/2014  . Abdominal pain, chronic, epigastric 06/12/2014  . Elevated blood pressure 03/27/2014  . Liver cirrhosis secondary to NASH 02/27/2014  . Abnormal chest CT 02/21/2014  . Chest wall pain 12/29/2013  . Dizziness and giddiness 10/30/2013  . DOE (dyspnea on exertion) 10/30/2013  . Nausea alone 10/30/2013  . Diabetes mellitus type 2, controlled, with complications 54/65/6812  . Medicare annual wellness visit, subsequent 07/29/2013  . DNR (do not resuscitate) 07/29/2013  . Encounter for routine gynecological examination 07/29/2013  . Dyspnea 07/10/2013  . Lung nodule 07/10/2013  . Right foot pain 05/20/2013  . Varicose veins of lower extremities with other complications 75/17/0017  . GERD (gastroesophageal reflux disease) 10/09/2012  . Cirrhosis of liver 02/02/2012  . Anxiety 07/11/2011  . Aortic dissection   . BACK PAIN, LUMBAR, CHRONIC 11/19/2009  . UNS ADVRS EFF OTH RX MEDICINAL&BIOLOGICAL SBSTNC 12/16/2008  . UNSPECIFIED VITAMIN D DEFICIENCY 09/26/2008  . TOBACCO ABUSE 06/19/2008  . RESTLESS LEGS SYNDROME 04/27/2007  . INSOMNIA 04/27/2007  . Benign Neoplasm of Colon 04/04/2007  . HLD (hyperlipidemia) 03/08/2007  . OSA (obstructive sleep apnea) 03/08/2007  . Essential hypertension 03/08/2007  . Asthma 03/08/2007  . BIPOLAR AFFECTIVE DISORDER, HX OF  03/08/2007   Past Medical History  Diagnosis Date  . Adenomatous colon polyp   . Insomnia   . RLS (restless legs syndrome)   . Colon polyp   . Incontinence   . Hepatitis B, chronic   . H/O: rheumatic fever   . Hyperlipidemia   . Unspecified disorders of nervous system   . Bipolar affective disorder     h/o  . H/O: CVA (cardiovascular accident)     TIA  . Fatty liver 04/09/08     found in abd CT  . Shortness of breath   . Obstructive sleep apnea     not using CPAP, last sleep study 2.5 years ago, per pt doctor is aware  . Blood transfusion 2000  . GERD (gastroesophageal reflux disease)   . Arthritis   . Aortic dissection     Type 1  . Asthma   . Anxiety   . Depression   . Stroke     TIA- 2002  . Ulcer   . Diabetes mellitus without complication   . Anemia   . Blood transfusion without reported diagnosis   . Sleep apnea    Past Surgical History  Procedure Laterality Date  . Back surgery      x 45  . Cholecystectomy    . Rotator cuff repair      left  . Abdominal hysterectomy      total  . Lumbar fusion  10/09  . Hemorroidectomy      and colon polyp removed  . Abdominal exploration surgery    . Posterior cervical fusion/foraminotomy    . Eye surgery      bilat  . Lumbar wound debridement  05/09/2011    Procedure: LUMBAR WOUND DEBRIDEMENT;  Surgeon: Dahlia Bailiff;  Location: [facility];  Service: Orthopedics;  Laterality: N/A;  IRRIGATION AND DEBRIDEMENT SPINAL WOUND  . Axillary artery cannulation via 8-mm hemashield graft, median sternotomy, extracorporeal circulation with deep hypothermic circulatory arrest, repair of  aortic dessection  01/23/2009    Dr Arlyce Dice  . Cataract      both eyes  . Colonoscopy    . Liver biopsy  04/10/2012    Procedure: LIVER BIOPSY;  Surgeon: Inda Castle, MD;  Location: [facility];  Service: Endoscopy;  Laterality: N/A;  ultrasound to mark order for abd limited/liver to be marked to be sent by [email or web address]    History  Substance Use Topics  . Smoking status: Former Smoker -- 0.50 packs/day for 50 years    Types: Cigarettes    Quit date: 04/06/2013  . Smokeless tobacco: Never Used  . Alcohol Use: No   Family History  Problem Relation Age of Onset  . Colon cancer Neg Hx   . Esophageal cancer Neg Hx   . Rectal cancer Neg Hx   . Breast cancer Mother   . Lung cancer Mother   . Stomach cancer Father   .  Diabetes      4 aunts, and 1 uncle   Allergies  Allergen Reactions  . Doxycycline Other (See Comments)    Face red and swollen; no difficulty breathing   Current Outpatient Prescriptions on File Prior to Visit  Medication Sig Dispense Refill  . ABILIFY 30 MG tablet TAKE 1 TABLET BY MOUTH EVERY MORNING 30 tablet 0  . Blood Glucose Monitoring Suppl (ACCU-CHEK AVIVA) device Use as instructed to test blood sugar once daily Dx E11.8 (Patient taking differently: Use as instructed to test blood sugar twice daily) 1 each  0  . budesonide-formoterol (SYMBICORT) 160-4.5 MCG/ACT inhaler Inhale 2 puffs into the lungs 2 (two) times daily. 1 Inhaler 3  . cephALEXin (KEFLEX) 500 MG capsule Take 1 capsule (500 mg total) by mouth 3 (three) times daily. 30 capsule 0  . clonazePAM (KLONOPIN) 0.5 MG tablet TAKE 1 TABLET BY MOUTH TWICE A DAY 60 tablet 0  . diazepam (VALIUM) 5 MG tablet TAKE 1 TABLET BY MOUTH TWICE A DAY 60 tablet 0  . fluconazole (DIFLUCAN) 150 MG tablet Take 1 tablet (150 mg total) by mouth daily. 7 tablet 0  . glucose blood (ACCU-CHEK AVIVA) test strip Use as instructed to test blood sugar once daily Dx E11.8 100 each 3  . hyoscyamine (LEVBID) 0.375 MG 12 hr tablet Take 1 tablet (0.375 mg total) by mouth 2 (two) times daily. 60 tablet 0  . lamoTRIgine (LAMICTAL) 100 MG tablet Take 3 tablets by mouth once daily    . Lancets (ACCU-CHEK SOFT TOUCH) lancets Use as instructed to test blood sugar once daily Dx E11.8 100 each 3  . lidocaine (LIDODERM) 5 % PLACE 1 PATCH ONTO THE SKIN DAILY. REMOVE & DISCARD PATCH WITHIN 12 HOURS OR AS DIRECTED BY MD 30 patch 2  . metFORMIN (GLUCOPHAGE) 1000 MG tablet Take 0.5 tablets (500 mg total) by mouth 2 (two) times daily with a meal. 180 tablet 0  . methscopolamine (PAMINE) 2.5 MG TABS tablet Take 1 tablet (2.5 mg total) by mouth 4 (four) times daily -  before meals and at bedtime. 120 tablet 3  . mirtazapine (REMERON) 15 MG tablet TAKE 1 TABLET BY MOUTH AT  BEDTIME 34 tablet 2  . nystatin (MYCOSTATIN/NYSTOP) 100000 UNIT/GM POWD Apply to affected area 4 times a day 60 g 1  . nystatin-triamcinolone ointment (MYCOLOG) APPLY 1 APPLICATION TOPICALLY 2 (TWO) TIMES DAILY. 30 g 0  . omeprazole (PRILOSEC) 40 MG capsule TAKE ONE CAPSULE BY MOUTH EVERY DAY 90 capsule 1  . ONETOUCH DELICA LANCETS FINE MISC Inject 30 g as directed 2 (two) times daily.   0  . promethazine (PHENERGAN) 25 MG tablet TAKE 1/2 TO 1 TABLET BY MOUTH EVERY 8 HOURS AS NEEDED FOR NAUSEA 90 tablet 2  . rOPINIRole (REQUIP) 3 MG tablet TAKE 1 TABLET BY MOUTH AT BEDTIME 90 tablet 0  . salicylic acid 6 % gel APPLY TO AFFECTED AREA EVERY DAY *USE AT NIGHT AND RINSE OFF IN THE MORNING* 40 g 0  . simvastatin (ZOCOR) 10 MG tablet Take 1 tablet (10 mg total) by mouth at bedtime. 90 tablet 3  . Spacer/Aero-Holding Chambers (AEROCHAMBER Z-STAT PLUS) inhaler Use as instructed 1 each 0  . SUPREP BOWEL PREP SOLN Take 1 kit by mouth once. 354 mL 0  . tolterodine (DETROL LA) 2 MG 24 hr capsule TAKE ONE CAPSULE BY MOUTH DAILY 90 capsule 0  . traMADol (ULTRAM) 50 MG tablet TAKE 2 TABLETS BY MOUTH EVERY MORNING AND EVENING AS NEEDED 120 tablet 0  . urea (CARMOL) 40 % CREA Apply nightly to feet    . zolpidem (AMBIEN) 5 MG tablet TAKE 1 TABLET BY MOUTH AT BEDTIME 30 tablet 0   No current facility-administered medications on file prior to visit.   The PMH, PSH, Social History, Family History, Medications, and allergies have been reviewed in [facility], and have been updated if relevant.   Review of Systems  Constitutional: Negative.  Negative for unexpected weight change.  HENT: Negative for hearing loss.   Eyes: Negative for visual disturbance.  Respiratory:  Negative for shortness of breath.   Cardiovascular: Negative for chest pain.  Gastrointestinal: Negative for vomiting and blood in stool.  Endocrine: Negative for polydipsia, polyphagia and polyuria.  Genitourinary: Negative for vaginal pain.    Musculoskeletal: Positive for myalgias and arthralgias.  Neurological: Positive for numbness. Negative for seizures, syncope and speech difficulty.  Hematological: Negative.   Psychiatric/Behavioral: Negative.  Negative for behavioral problems.  All other systems reviewed and are negative.     See HPI  Objective:    BP 122/74 mmHg  Pulse 88  Temp(Src) 97.5 F (36.4 C) (Oral)  Ht 5' 2.25" (1.581 m)  Wt 213 lb (96.616 kg)  BMI 38.65 kg/m2  SpO2 96% Wt Readings from Last 3 Encounters:  08/19/14 213 lb (96.616 kg)  08/13/14 218 lb 9.6 oz (99.156 kg)  08/04/14 217 lb (98.431 kg)     Physical Exam   General:  Well-developed,well-nourished,in no acute distress; alert,appropriate and cooperative throughout examination Head:  normocephalic and atraumatic.   Eyes:  vision grossly intact, pupils equal, pupils round, and pupils reactive to light.   Ears:  R ear normal and L ear normal.   Nose:  no external deformity.   Mouth:  good dentition.   Neck:  No deformities, masses, or tenderness noted. Breasts:  No mass, nodules, thickening, tenderness, bulging, retraction, inflamation, nipple discharge or skin changes noted.   Lungs:  Normal respiratory effort, chest expands symmetrically. Lungs are clear to auscultation, no crackles or wheezes. Heart:  Normal rate and regular rhythm. S1 and S2 normal without gallop, murmur, click, rub or other extra sounds. Abdomen:  Bowel sounds positive,abdomen soft and non-tender without masses, organomegaly or hernias noted. Msk:  No deformity or scoliosis noted of thoracic or lumbar spine.   Extremities:  No clubbing, cyanosis, edema, or deformity noted with normal full range of motion of all joints.   Toe nails long, discolored- right ingrown without signs of infection Neurologic:  alert & oriented X3 and gait normal.   Cervical Nodes:  No lymphadenopathy noted Axillary Nodes:  No palpable lymphadenopathy Psych:  Cognition and judgment appear  intact. Alert and cooperative with normal attention span and concentration. No apparent delusions, illusions, hallucinations      Assessment & Plan:   HLD (hyperlipidemia)  Essential hypertension  Medicare annual wellness visit, subsequent  Other cirrhosis of liver  Thrombocytopenia - Plan: Ambulatory referral to Hematology  DNR (do not resuscitate) No Follow-up on file.

## 2014-08-19 NOTE — Assessment & Plan Note (Signed)
Refer to podiatry

## 2014-08-19 NOTE — Assessment & Plan Note (Signed)
The patients weight, height, BMI and visual acuity have been recorded in the chart I have made referrals, counseling and provided education to the patient based review of the above and I have provided the pt with a written personalized care plan for preventive services.

## 2014-08-21 ENCOUNTER — Telehealth: Payer: Self-pay

## 2014-08-21 DIAGNOSIS — F312 Bipolar disorder, current episode manic severe with psychotic features: Secondary | ICD-10-CM | POA: Diagnosis not present

## 2014-08-21 NOTE — Telephone Encounter (Signed)
Pt left v/m; pt was seen 08/19/14 for annual exam and forgot to tell Dr Deborra Medina that pt had been having episodes of dizziness, stumbling but not falling. Pt wants to know Dr Hulen Shouts suggestion.Pt request cb. [facility].

## 2014-08-21 NOTE — Telephone Encounter (Signed)
Dizziness is a complicated complaint.  Please ask her to make an appointment to discuss and evaluate.

## 2014-08-22 NOTE — Telephone Encounter (Signed)
Noted! Thank you

## 2014-08-22 NOTE — Telephone Encounter (Signed)
Attempted to contact pt;line busy

## 2014-08-22 NOTE — Telephone Encounter (Signed)
Spoke to pt and advised per Dr Deborra Medina. Pt states that she has a lot of upcoming referral appts and "will not have time" to make an additional appointment. Pt states she will monitor dizziness closely and contact us with updates

## 2014-08-25 ENCOUNTER — Other Ambulatory Visit: Payer: Self-pay | Admitting: Family Medicine

## 2014-08-25 ENCOUNTER — Ambulatory Visit
Admit: 2014-08-25 | Disposition: A | Discharge status: Home / Self Care | Payer: Self-pay | Attending: Internal Medicine | Admitting: Internal Medicine

## 2014-08-26 ENCOUNTER — Encounter: Payer: Self-pay | Admitting: Podiatry

## 2014-08-26 ENCOUNTER — Ambulatory Visit (INDEPENDENT_AMBULATORY_CARE_PROVIDER_SITE_OTHER): Payer: Commercial Managed Care - HMO

## 2014-08-26 ENCOUNTER — Ambulatory Visit (INDEPENDENT_AMBULATORY_CARE_PROVIDER_SITE_OTHER): Payer: Commercial Managed Care - HMO | Admitting: Podiatry

## 2014-08-26 ENCOUNTER — Other Ambulatory Visit: Payer: Self-pay | Admitting: Family Medicine

## 2014-08-26 VITALS — BP 158/93 | HR 98 | Resp 16 | Ht 62.0 in | Wt 213.0 lb

## 2014-08-26 DIAGNOSIS — G629 Polyneuropathy, unspecified: Secondary | ICD-10-CM | POA: Diagnosis not present

## 2014-08-26 DIAGNOSIS — M8430XA Stress fracture, unspecified site, initial encounter for fracture: Secondary | ICD-10-CM

## 2014-08-26 DIAGNOSIS — R52 Pain, unspecified: Secondary | ICD-10-CM | POA: Diagnosis not present

## 2014-08-26 NOTE — Telephone Encounter (Signed)
Rx called in to requested pharmacy 

## 2014-08-26 NOTE — Progress Notes (Signed)
   Subjective:    Patient ID: Stephanie [name], female    DOB: 08/27/1950, 64 y.o.   MRN: [id number]  Diabetes   64 year old female presents the office today with complaints of bilateral foot and leg pain. She states that she has pain all over both of her feet her legs. She states it is his been ongoing for approximate 6 months has been increasing particular to the left foot. She states that she has intermittent swelling to her feet and legs without any associated erythema or increase in warmth. She states that she has numbness to her feet and the front of her legs to her knee. She states that she has numbness the front of her legs and pain and cramping in the calf bilaterally. She denies any history of injury or trauma. No other complaints at this time.    Review of Systems  Endocrine:       Diabetes        Objective:   Physical Exam AAO 3, NAD DP/PT pulses palpable, CRT less than 3 seconds  Protective sensation decreased with Simms Weinstein monofilament, decreased vibratory sensation, Achilles tendon reflex intact bilaterally. There is diffuse tenderness to bilateral lower extremities it is difficult to pinpoint a specific area. However, upon palpation of the second metatarsal she did seem to have more exquisite pain over this area compared other areas of her feet. There is mild overlying edema bilaterally without any erythema or increase in warmth. No snapping and discomfort ankle joint, subtalar joint range of motion. No pain with MTPJ range of motion. MMT 4/5, ROM WNL.  There is discomfort in the calf subjectively however there is no pain with specific calf compression or any erythema, increase in warmth, edema. No open lesions or pre-ulcerative lesions identified bilaterally.      Assessment & Plan:  64 year old female with diffuse bilateral pain, possible left second metatarsal stress fracture -X-rays were obtained and reviewed the patient. There is a questionable area on the  left foot 2nd metatarsal, however this is likely chronic. However given her pain recommended immobilization in a surgical shoe and repeat x-rays in 2 weeks. A surgical shoe was dispensed to her. -She does appear to have neuropathy type symptoms and pain associated with it. She has a history of 5 back surgeries and the L4-L5 area. At her last appointment with her back specialists he recommended pain management for which she has not gone. I recommended follow-up with her primary care physician or possibly referral to pain management. -Due to complaints of calf cramping and pain venous duplex rule out DVT -Recommend follow-up with primary care physician. -Follow-up in 2 weeks for repeat x-rays left foot or sooner if any palms are to arise. In the meantime encouraged to call the office with any questions, concerns, change in symptoms.

## 2014-08-26 NOTE — Telephone Encounter (Signed)
Last f/u appt 08/2014-CPE

## 2014-08-27 ENCOUNTER — Encounter: Payer: Self-pay | Admitting: Gastroenterology

## 2014-08-27 ENCOUNTER — Ambulatory Visit (AMBULATORY_SURGERY_CENTER): Payer: Commercial Managed Care - HMO | Admitting: Gastroenterology

## 2014-08-27 VITALS — BP 116/77 | HR 76 | Temp 97.7°F | Resp 16 | Ht 62.0 in | Wt 218.0 lb

## 2014-08-27 DIAGNOSIS — Z86010 Personal history of colonic polyps: Secondary | ICD-10-CM

## 2014-08-27 DIAGNOSIS — D120 Benign neoplasm of cecum: Secondary | ICD-10-CM

## 2014-08-27 DIAGNOSIS — D122 Benign neoplasm of ascending colon: Secondary | ICD-10-CM

## 2014-08-27 DIAGNOSIS — D126 Benign neoplasm of colon, unspecified: Secondary | ICD-10-CM

## 2014-08-27 HISTORY — DX: Benign neoplasm of colon, unspecified: D12.6

## 2014-08-27 LAB — GLUCOSE, CAPILLARY
GLUCOSE-CAPILLARY: 114 mg/dL — AB (ref 70–99)
Glucose-Capillary: 123 mg/dL — ABNORMAL HIGH (ref 70–99)

## 2014-08-27 MED ORDER — SODIUM CHLORIDE 0.9 % IV SOLN: 500.0000 mL | INTRAVENOUS | Status: DC

## 2014-08-27 NOTE — Progress Notes (Signed)
Report to PACU, RN, vss, BBS= Clear.  

## 2014-08-27 NOTE — Progress Notes (Signed)
Called to room to assist during endoscopic procedure.  Patient ID and intended procedure confirmed with present staff. Received instructions for my participation in the procedure from the performing physician.  

## 2014-08-27 NOTE — Op Note (Signed)
[facility]  [address] [place], [id number]   COLONOSCOPY PROCEDURE REPORT  PATIENT: Stephanie [name], Stephanie [name]  MR#: [id number] BIRTHDATE: 16-Jul-1950 , 4  yrs. old GENDER: female ENDOSCOPIST: Inda Castle, MD REFERRED ZR:AQTMA Aron, M.D. PROCEDURE DATE:  08/27/2014 PROCEDURE:   Colonoscopy, surveillance and Colonoscopy with snare polypectomy First Screening Colonoscopy - Avg.  risk and is 50 yrs.  old or older - No.  Prior Negative Screening - Now for repeat screening. Above average risk  History of Adenoma - Now for follow-up colonoscopy & has been > or = to 3 yrs.  Yes hx of adenoma.  Has been 3 or more years since last colonoscopy. ASA CLASS:   Class III INDICATIONS:high risk patient with personal history of colonic polyps. MEDICATIONS: Monitored anesthesia care and Propofol 230 mg IV  DESCRIPTION OF PROCEDURE:   After the risks benefits and alternatives of the procedure were thoroughly explained, informed consent was obtained.  The digital rectal exam revealed no abnormalities of the rectum.   The LB [id number]  endoscope was introduced through the anus and advanced to the cecum, which was identified by both the appendix and ileocecal valve. No adverse events experienced.   The quality of the prep was (Suprep was used) good.  The instrument was then slowly withdrawn as the colon was fully examined.      COLON FINDINGS: Two flat polyps ranging from 1 to 23m in size were found at the cecum.  A polypectomy was performed with cold forceps. A sessile polyp measuring 15 mm in size was found in the ascending colon.  A polypectomy was performed in a piecemeal fashion with a cold snare.  The resection was complete, the polyp tissue was completely retrieved and sent to histology.  Retroflexed views revealed no abnormalities. The time to cecum = 3.0 Withdrawal time = 14.7   The scope was withdrawn and the procedure completed. COMPLICATIONS: There were no  immediate complications.  ENDOSCOPIC IMPRESSION: 1.   Two flat polyps ranging from 1 to 257min size were found at the cecum; polypectomy was performed with cold forceps 2.   Sessile polyp was found in the ascending colon; polypectomy was performed in a piecemeal fashion with a cold snare  RECOMMENDATIONS: If the polyps are precancerous polyps, recommend follow-up colonoscopy in 3 years, otherwise follow-up exam in 10 years.  eSigned:  RoInda CastleMD 08/27/2014 10:03 AM   cc:   PATIENT NAME:  RoEliane, HammersmithR#: [id number]

## 2014-08-28 ENCOUNTER — Telehealth: Payer: Self-pay

## 2014-08-28 ENCOUNTER — Telehealth: Payer: Self-pay | Admitting: Family Medicine

## 2014-08-28 NOTE — Telephone Encounter (Signed)
  Follow up Call-  Call back number 08/27/2014 04/10/2014 03/27/2013 02/07/2012  Post procedure Call Back phone  # [phone]  Permission to leave phone message Yes Yes Yes Yes     Patient questions:  Do you have a fever, pain , or abdominal swelling? No. Pain Score  0 *  Have you tolerated food without any problems? Yes.    Have you been able to return to your normal activities? Yes.    Do you have any questions about your discharge instructions: Diet   No. Medications  No. Follow up visit  No.  Do you have questions or concerns about your Care? No.  Actions: * If pain score is 4 or above: No action needed, pain <4.  Per the pt she has nasal congestion and scratchy throat.  I advised her that she had o2 for her procedure and it can cause nasal congestion. maw

## 2014-08-28 NOTE — Telephone Encounter (Signed)
Pt called with questions regarding referral to ultrasound. Please call back at [phone]. Thanks.

## 2014-08-29 ENCOUNTER — Ambulatory Visit
Admit: 2014-08-29 | Disposition: A | Discharge status: Home / Self Care | Payer: Self-pay | Attending: Internal Medicine | Admitting: Internal Medicine

## 2014-08-29 LAB — CBC CANCER CENTER
BASOS PCT: 1.1 %
Basophil #: 0.1 x10 3/mm (ref 0.0–0.1)
EOS PCT: 2.4 %
Eosinophil #: 0.1 x10 3/mm (ref 0.0–0.7)
HCT: 38.8 % (ref 35.0–47.0)
HGB: 12.8 g/dL (ref 12.0–16.0)
Lymphocyte #: 1.2 x10 3/mm (ref 1.0–3.6)
Lymphocyte %: 23.6 %
MCH: 30.9 pg (ref 26.0–34.0)
MCHC: 33.1 g/dL (ref 32.0–36.0)
MCV: 93 fL (ref 80–100)
MONO ABS: 0.4 x10 3/mm (ref 0.2–0.9)
MONOS PCT: 7.2 %
NEUTROS PCT: 65.7 %
Neutrophil #: 3.3 x10 3/mm (ref 1.4–6.5)
Platelet: 121 x10 3/mm — ABNORMAL LOW (ref 150–440)
RBC: 4.16 10*6/uL (ref 3.80–5.20)
RDW: 13.9 % (ref 11.5–14.5)
WBC: 5 x10 3/mm (ref 3.6–11.0)

## 2014-08-30 LAB — AFP TUMOR MARKER: AFP TUMOR MARKER: 4.4 ng/mL (ref 0.0–8.3)

## 2014-09-03 ENCOUNTER — Other Ambulatory Visit: Payer: Self-pay | Admitting: Family Medicine

## 2014-09-03 IMAGING — MG MM DIGITAL SCREENING BILAT W/ CAD
1 series · 7 of 7 positions shown · non-contrast
Comparison: Previous exam(s).

CLINICAL DATA: Screening.

EXAM:
DIGITAL SCREENING BILATERAL MAMMOGRAM WITH CAD

[R CC · right · 7 of 7 slices shown]
[im 1/7]
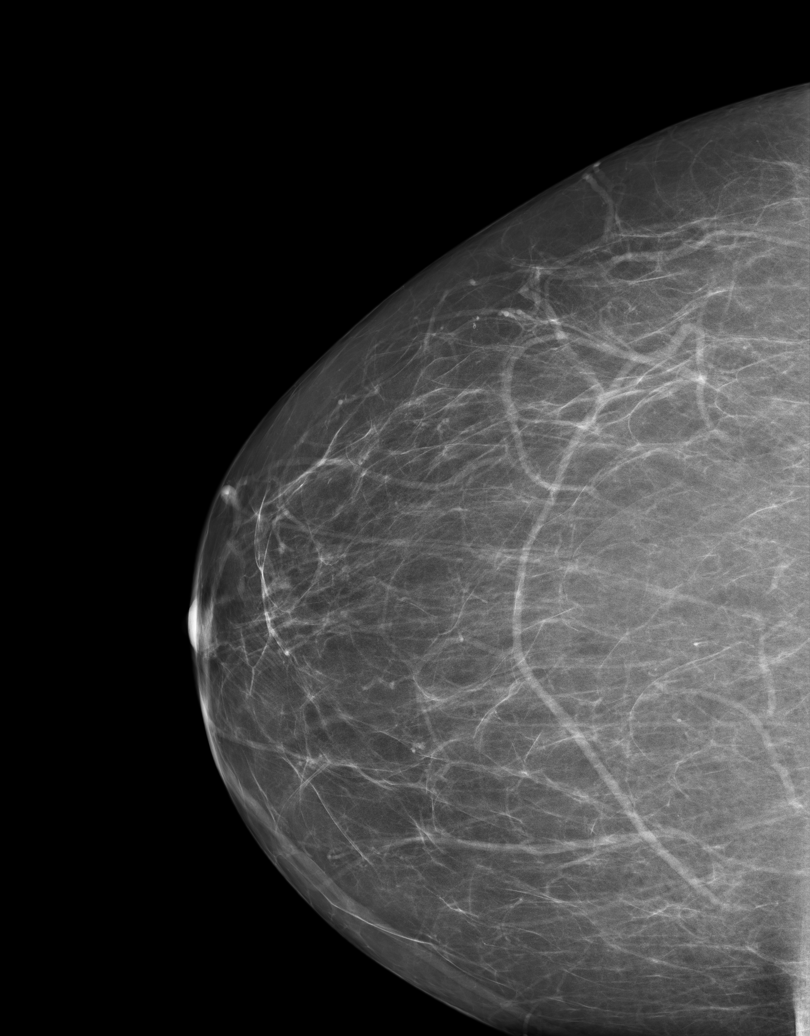
[im 2/7]
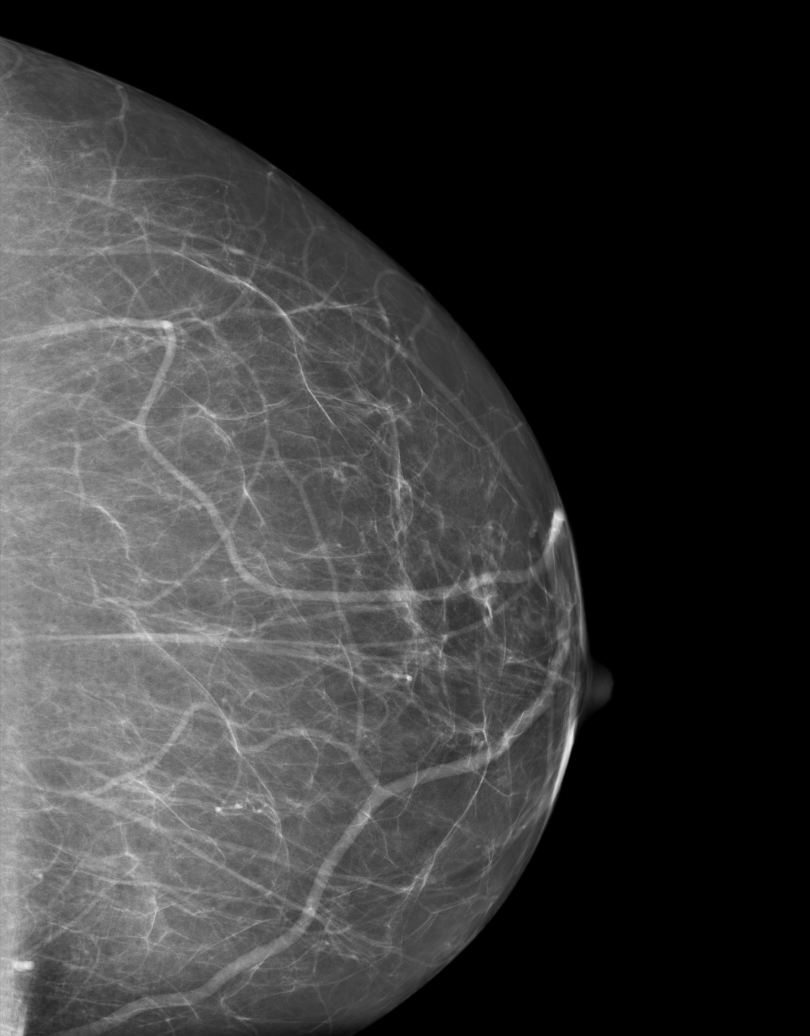
[im 3/7]
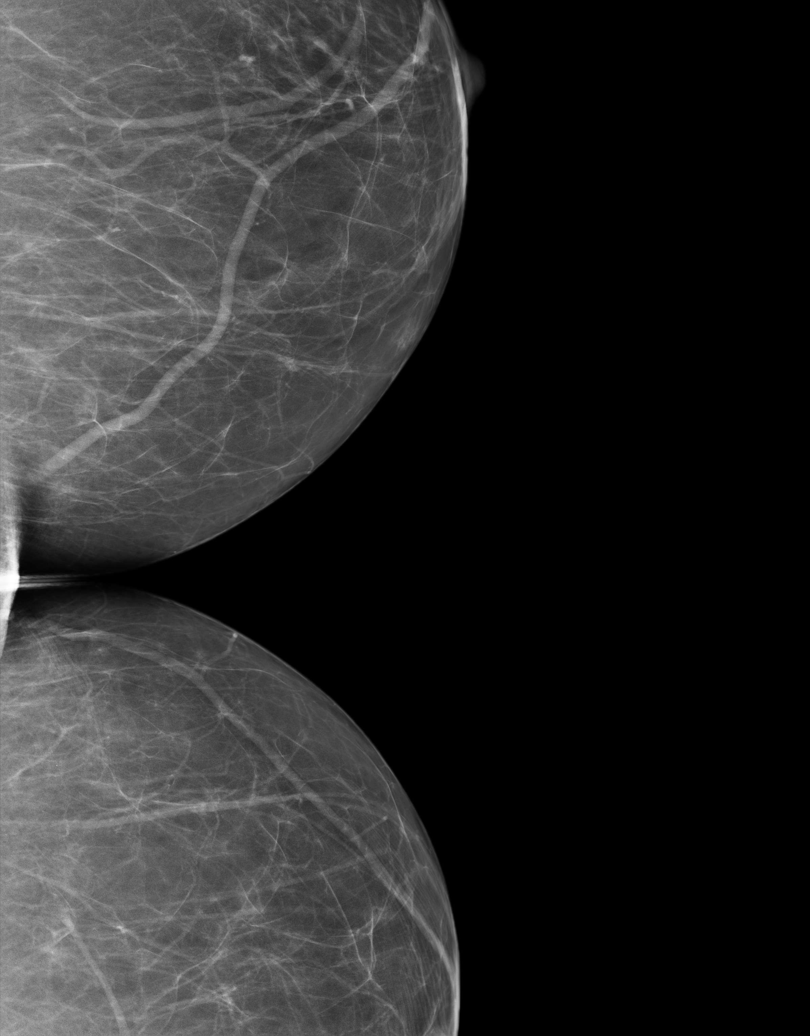
[im 4/7]
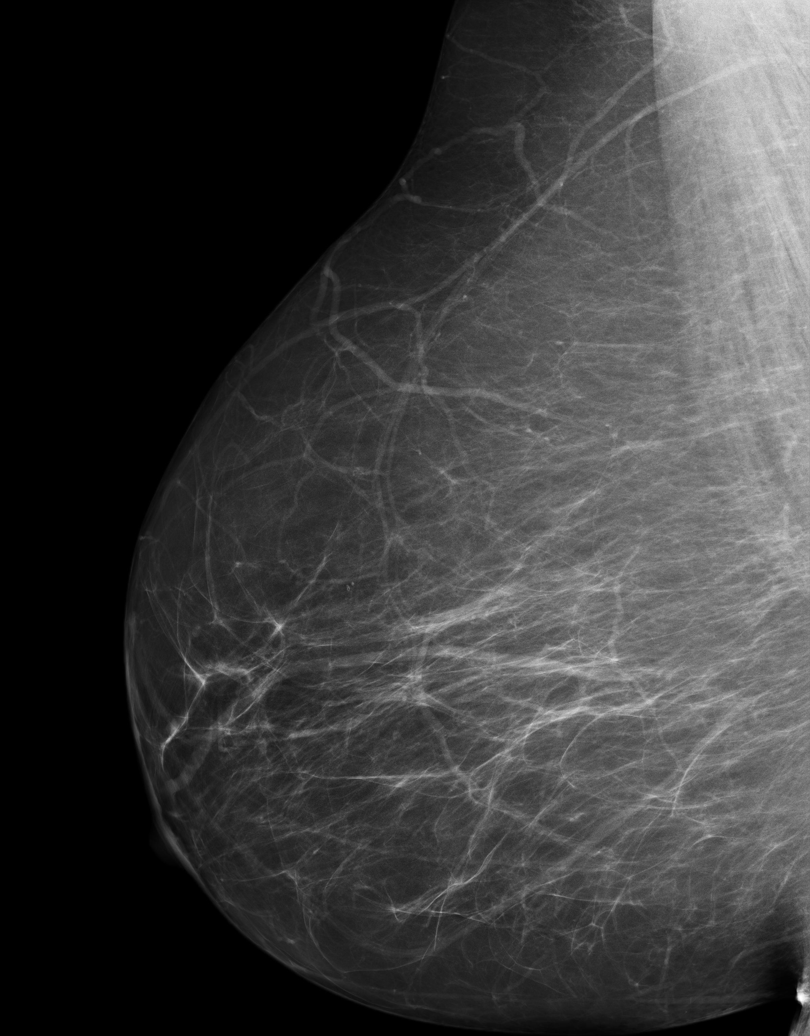
[im 5/7]
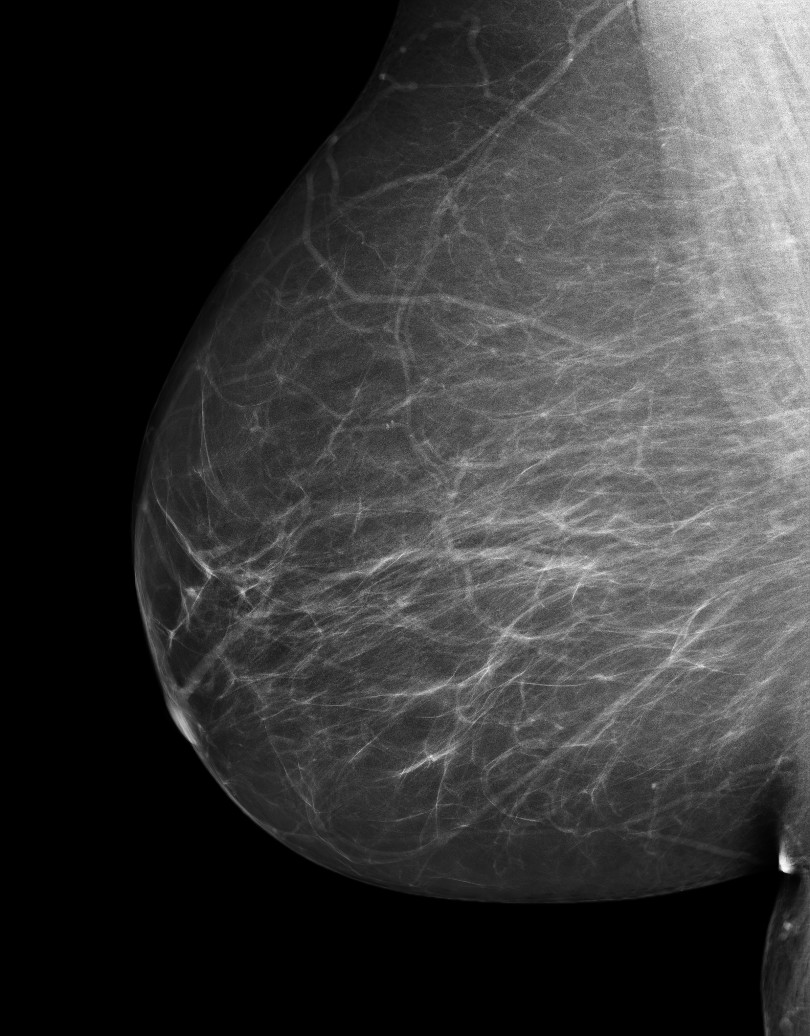
[im 6/7]
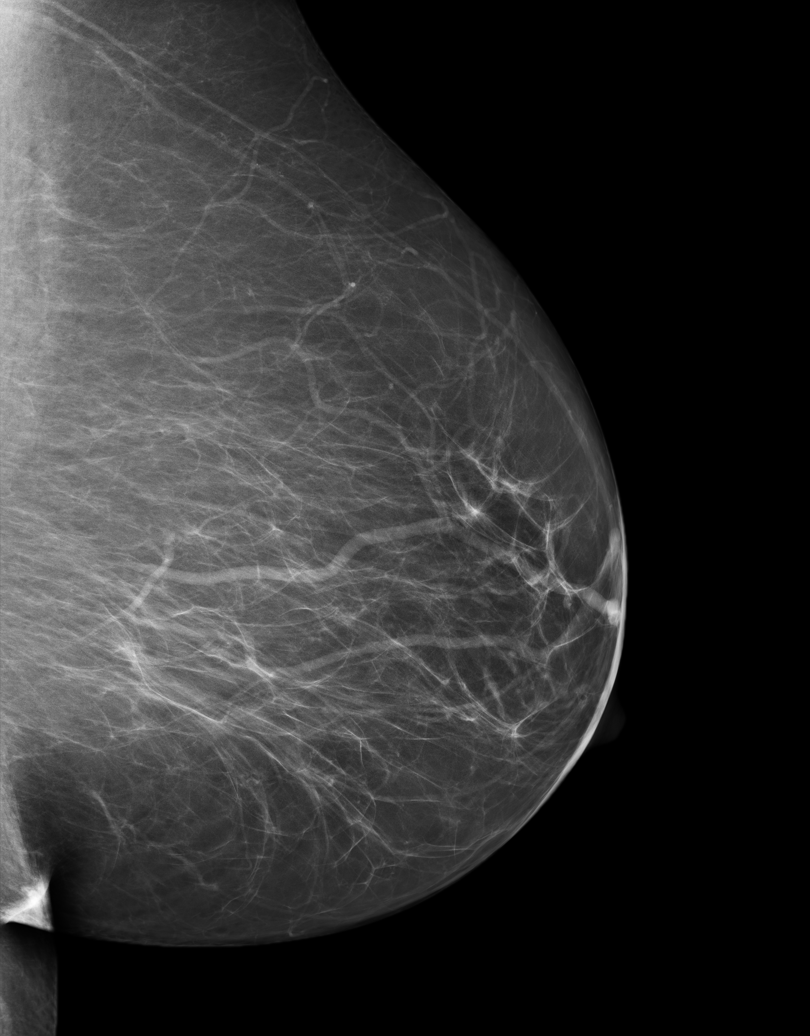
[im 7/7]
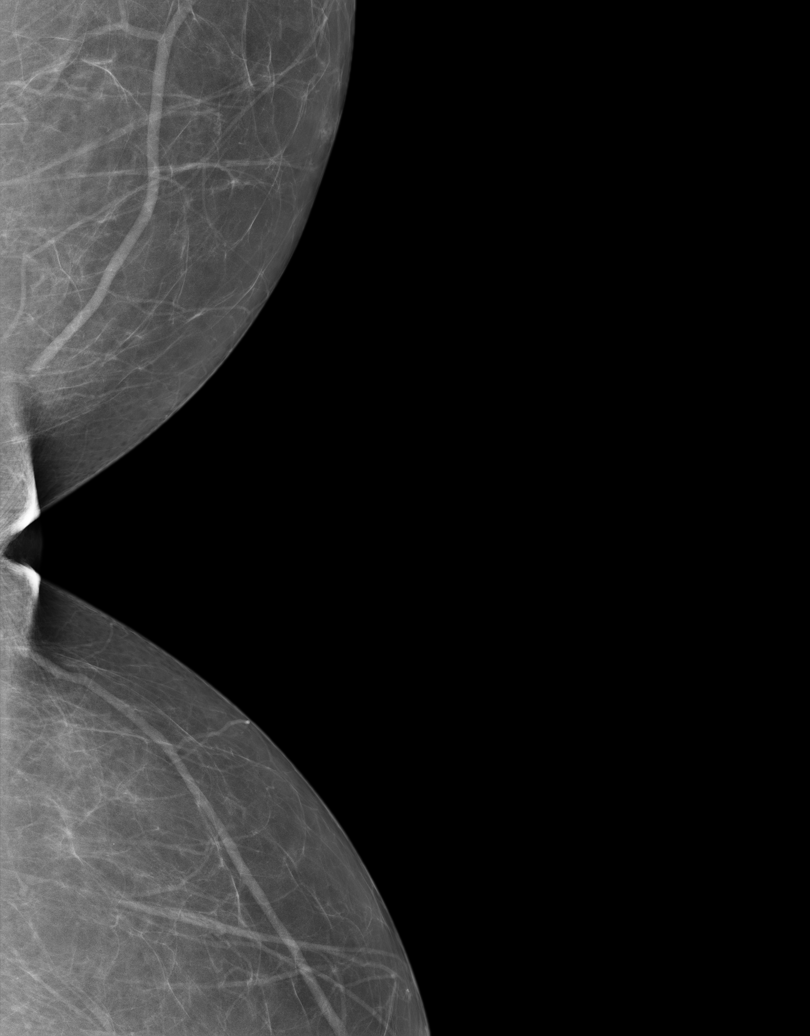

[7 of 7 positions shown; findings below may reference images not displayed]

ACR Breast Density Category b: There are scattered areas of
fibroglandular density.
FINDINGS: There are no findings suspicious for malignancy. Images were
processed with CAD.
IMPRESSION: No mammographic evidence of malignancy. A result letter of this
screening mammogram will be mailed directly to the patient.

RECOMMENDATION:
Screening mammogram in one year. (Code:AS-G-LCT)

BI-RADS CATEGORY  1: Negative.

## 2014-09-03 NOTE — Telephone Encounter (Signed)
[facility] got the patient set up for a lower ext venous US at at [facility] on 09/02/14

## 2014-09-04 ENCOUNTER — Encounter: Payer: Self-pay | Admitting: Podiatry

## 2014-09-04 ENCOUNTER — Encounter: Payer: Self-pay | Admitting: Gastroenterology

## 2014-09-04 NOTE — Telephone Encounter (Signed)
Rx called in to requested pharmacy 

## 2014-09-04 NOTE — Telephone Encounter (Signed)
Last f/u appt 08/2014-CPE

## 2014-09-05 ENCOUNTER — Ambulatory Visit
Admit: 2014-09-05 | Disposition: A | Discharge status: Home / Self Care | Payer: Self-pay | Attending: Internal Medicine | Admitting: Internal Medicine

## 2014-09-09 ENCOUNTER — Ambulatory Visit (INDEPENDENT_AMBULATORY_CARE_PROVIDER_SITE_OTHER): Payer: Commercial Managed Care - HMO | Admitting: Podiatry

## 2014-09-09 ENCOUNTER — Ambulatory Visit (INDEPENDENT_AMBULATORY_CARE_PROVIDER_SITE_OTHER): Payer: Commercial Managed Care - HMO

## 2014-09-09 ENCOUNTER — Other Ambulatory Visit: Payer: Self-pay | Admitting: Family Medicine

## 2014-09-09 VITALS — BP 110/80 | HR 78 | Resp 16

## 2014-09-09 DIAGNOSIS — B351 Tinea unguium: Secondary | ICD-10-CM

## 2014-09-09 DIAGNOSIS — M19079 Primary osteoarthritis, unspecified ankle and foot: Secondary | ICD-10-CM

## 2014-09-09 DIAGNOSIS — E1140 Type 2 diabetes mellitus with diabetic neuropathy, unspecified: Secondary | ICD-10-CM | POA: Diagnosis not present

## 2014-09-09 DIAGNOSIS — M79671 Pain in right foot: Secondary | ICD-10-CM | POA: Diagnosis not present

## 2014-09-09 DIAGNOSIS — M79672 Pain in left foot: Secondary | ICD-10-CM

## 2014-09-09 DIAGNOSIS — E1149 Type 2 diabetes mellitus with other diabetic neurological complication: Secondary | ICD-10-CM

## 2014-09-09 NOTE — Progress Notes (Signed)
Patient ID: Stephanie [name], female   DOB: [birth year], 64 y.o.   MRN: [id number]  Subjective: 64 year old female presents the office they for follow-up evaluation of bilateral foot pain. She states that she's continued with a surgical shoe on that she wears on the right side as her right side is more painful in the left. She states that she is scheduled to follow-up with pain management on April 7. She denies any recent injury or trauma. Denies any swelling or redness overlying her lower extremities. She also states that her nails are painful elongated and she is unable to trim them herself. Denies any redness or drainage line nail sites. No other complaints at this time.  Objective: AAO 3, NAD DP/PT pulses palpable, CRT less than 3 seconds Protective sensation decreased with Sims once the monofilament, decreased vibratory sensation, Achilles tendon reflex intact. There is diffuse tenderness continue her bilateral lower extremities. At today's appointment it seems and the majority the pain is localized over the dorsal acid the midfoot and there is a prominent exostosis palpable with the right side greater than left. There is no tenderness along the course of the metatarsal at this time bilaterally. There is no overlying edema, erythema, increase in warmth bilaterally. MMT 5/5, ROM WNL Nails are hypertrophic, dystrophic, elongated, brittle, discolored 10. There subjective tenderness on nails 1-5 bilaterally. There is no swelling erythema or drainage line nail sites. No open lesions or pre-ulcer lesions identified bilaterally. No pain with calf compression, swelling, warmth, erythema.  Assessment: 64 year old female with bilateral foot pain, midfoot arthritis; neuropathy  Plan: -Repeat x-rays were obtained and reviewed with the patient. No evidence of stress fracture fracture this time. -Discussed with her that she would likely benefit from some, arch support her inserts with her shoe to help take  pressure off of since Medicare area. A prescription for diabetic shoes was given to the patient. -She is inquiring about possible increasing her gabapentin. Recommend discussed this with pain management. -Nails sharply debrided x 10 without complications/bleeding -Follow-up in 3 months or sooner if any problems are to arise. In the meantime encouraged to call the office if any questions, concerns, change in symptoms.

## 2014-09-10 ENCOUNTER — Other Ambulatory Visit: Payer: Self-pay | Admitting: Family Medicine

## 2014-09-10 NOTE — Telephone Encounter (Signed)
Pt left v/m requesting status of ambien refill to [facility]; [facility] and did not have [facility]ill authorization called in earlier. Medication phoned to [facility] as instructed. Pt will ck with pharmacy later to pick up med.

## 2014-09-10 NOTE — Telephone Encounter (Signed)
Rx called in to requested pharmacy 

## 2014-09-10 NOTE — Telephone Encounter (Signed)
Last f/u appt 08/2014-CPE

## 2014-09-12 ENCOUNTER — Other Ambulatory Visit: Payer: Self-pay

## 2014-09-12 MED ORDER — MELOXICAM 7.5 MG PO TABS: 7.5000 mg | ORAL_TABLET | Freq: Every day | ORAL | Status: DC

## 2014-09-12 NOTE — Telephone Encounter (Signed)
Pt request refill meloxicam to [facility]. Is on current med list but do not see where Dr Deborra Medina has prescribed before. Pt going out of town on 09/16/14 and request refill by 04/11/*16.Please advise.

## 2014-09-15 ENCOUNTER — Telehealth: Payer: Self-pay | Admitting: Family Medicine

## 2014-09-15 NOTE — Telephone Encounter (Signed)
Pt called back to see what she needed to do

## 2014-09-15 NOTE — Telephone Encounter (Signed)
Pt called to let you know  That her insurance will not pay for some of her pain management appointments She wanted to see what else she could do, she stated she was going to pull out of pain management because of cost She wanted to make sure dr Deborra Medina will still be her pcp

## 2014-09-15 NOTE — Telephone Encounter (Signed)
I do not manage chronic pain.  Stephanie [name], what are her options?

## 2014-09-16 ENCOUNTER — Other Ambulatory Visit: Payer: Self-pay

## 2014-09-16 MED ORDER — GLUCOSE BLOOD VI STRP: ORAL_STRIP | Status: DC

## 2014-09-16 MED ORDER — ACCU-CHEK SOFT TOUCH LANCETS MISC: Status: DC

## 2014-09-16 NOTE — Telephone Encounter (Signed)
Spoke with patient regarding the Pain clinic appointment that she recently had as she was referred to Pain, Dr Angie Fava from Dr [facility] Infante her Ortho. Dr Angie Fava saw her and gave her a written RX for Ibuprofen 850m and Hydrocodone 5-3251m Their office told her she had to have a Psychological consult for substance abuse before she came back to a monthly appt which is scheduled for April 21st. The Psychologist cost is $100 and she would have to pay out of pocket and she doesn't have the money to pay for this. She said that she doesn't take narcotics and all she takes is tramadol that you write her RX's for and Mobic that she gets from Dr BrRolena InfanteShe says she doesn't want to go back to the Dr DaAngie Favaecause she doesn't need to take narcotic pain medicine. She already told the Pain clinic that she wasn't coming back to them. I told ShCerisehat every Pain clinic requires the Psychological consult before they would continue her Pain medicine. She says all she wants to take is the Tramadol medication that you give her and the Mobic from Dr BrRolena InfanteShe will also call Dr BrRolena Infanteffice to let them know that she didn't want to go back. I told ShOriannahat if she needs to take any narcotic pain medicine at all that she probably needs to keep the appointment with the Pain clinic that noone is going to manage her back pain and that is why she was referred to Dr DaAngie FavaIn the first place. There are no other options for Pain because wherever she goes they will require a Psych assessment and monthly appointments and ShTeliaas not aware at all that this was what Dr BrRolena Infantentended for her.

## 2014-09-16 NOTE — Telephone Encounter (Signed)
Pt called to get update on pain mgt; pt is not out of pain med and pt speaking with Rosaria Ferries.

## 2014-09-16 NOTE — Telephone Encounter (Signed)
Pt request refill accu ckek aviva test strip and lancet to [facility]. Pt is not using [facility] for diabetic supplies. Advised refill sent to [facility].

## 2014-09-18 ENCOUNTER — Other Ambulatory Visit: Payer: Self-pay | Admitting: Family Medicine

## 2014-09-19 NOTE — Telephone Encounter (Signed)
Yes I can continue to prescribe what I have been prescribing but cannot manage her chronic pain.

## 2014-09-19 NOTE — Telephone Encounter (Signed)
Pt left v/m wanting f/u call about pt not going to pain clinic and pt wants to know if Dr Deborra Medina will continue to prescribe Requip, Valium and clonazepam.

## 2014-09-22 NOTE — Telephone Encounter (Signed)
Spoke to pt and advised per Dr Deborra Medina. Pt verbally expressed understanding and was only wanting to confirm that she would still be able to receive tramadol for pain.

## 2014-09-22 NOTE — Telephone Encounter (Signed)
Attempted to contact pt; vm not set up to leave message

## 2014-09-24 ENCOUNTER — Other Ambulatory Visit: Payer: Self-pay | Admitting: Pulmonary Disease

## 2014-09-25 NOTE — Telephone Encounter (Signed)
#  1 submitted with 0 refill  Last ov in October of 2015. Patient needed 6 months f/u.

## 2014-09-26 NOTE — H&P (Signed)
PATIENT NAME:  Stephanie [name], [place] MR#:  [id number] DATE OF BIRTH:  1950/07/31  DATE OF ADMISSION:  04/03/2013  REFERRING EMERGENCY ROOM PHYSICIAN:  Dr. Jasmine December.   CHIEF COMPLAINT: Shortness of breath.   HISTORY OF PRESENTING ILLNESS: A 64 year old female who is a smoker and history of COPD using inhaler at home and usually stable, but gradually getting short of breath on walking. For the last 2 days, she has worsening of her shortness of breath, and she is getting excessively short of breath on minimal exertion. She also has some dry cough, and she noticed low-grade temperature last night, which was up to 100 degrees Fahrenheit, and she has chest pain with deep breaths, and so decided to come to the Emergency Room. She called her primary care physician, but they did not have any good recommendation, and so she decided to come to the hospital today. The patient had undergone an endoscopy 10 days ago because she had a history of gastric ulcer a few years ago. The endoscopy went without any significant events, and she was doing okay after that. She tried, for her shortness of breath and chest pain, using her inhalers, but did not help in the last few days. Hospitalist service is contacted for COPD exacerbation.     REVIEW OF SYSTEMS:   CONSTITUTIONAL: Negative for fever, fatigue, weakness, pain or weight loss.  EYES: No blurring, double vision. Discharges from the eyes.  ENT: No tinnitus, ear pain or hearing loss.  RESPIRATORY: The patient has cough and mild shortness of breath, but no sputum production.  CARDIOVASCULAR: No chest pain, orthopnea, edema or palpitations.  GASTROINTESTINAL: No nausea, vomiting, diarrhea or abdominal pain.  GENITOURINARY: No dysuria, hematuria or increased frequency of urination.  ENDOCRINE: No increased sweating. No heat or cold intolerance.  SKIN: No acne, rashes, lesions on the skin.  MUSCULOSKELETAL: No pain or swelling in the joints.  NEUROLOGICAL: No numbness,  weakness, dysarthria, tremor or vertigo.  PSYCHIATRIC: No anxiety, insomnia, bipolar disorder at this time.   PAST MEDICAL HISTORY: Depression, gastroesophageal reflux disease, muscle spasms, anxiety, bipolar disorder, COPD and peptic ulcer disease.   FAMILY HISTORY: Mother died of lung cancer, and she also had breast cancer. Father died of stomach cancer at age of 60. Her aunt on maternal side had diabetes.   SOCIAL HISTORY: She is single. Smokes 1 pack of cigarettes every day, almost 45 years. On disability due to chronic back pain. Denies drinking alcohol or illegal drug use.   HOME MEDICATIONS: 1.  Zolpidem 10 mg oral tablet at night.  2.  Vitamin D3, 400 International Units once a day.  3.  Ventolin 2 puffs inhalation every 6 hours.  4.  Tramadol 50 mg oral tablet 2 tablets 2 times a day.  5.  Spiriva 18 mcg inhalation once a day.  6.  Remeron 15 mg oral tablet, take 1/2 tablet once a day.  7.  Quetiapine 400 mg oral tablet take 1 tablet once a day.  8.  Omeprazole 40 mg once a day.  9.  Metformin extended release 500 mg once a day.   10.  Lamictal 100 mg oral tablet 3 tablets once a day.  11.  Klonopin 0.5 mg oral tablet 1 tablet 2 times a day.  12.  Diazepam 5 mg oral tablet 2 times a day as needed for anxiety.  13.  Detrol LA 2 mg oral tablet once a day.  14.  Colace 100 mg capsule 2 times a day.  15.  Abilify 30 mg oral tablet once a day.   VITAL SIGNS:  In the ER, temperature 98.1, pulse 97, respirations 28, blood pressure 125/77, pulse ox 90% on room air, and it is dropping down to 87 and 88 on talking to me.   PHYSICAL EXAMINATION:  GENERAL: The patient is fully alert and oriented to time, place and person.  HEENT: Head and neck atraumatic. Conjunctiva pink. Oral mucosa moist.  NECK: Supple. No JVD.  RESPIRATORY: Bilateral equal air entry, but has wheezing present bilaterally.  CARDIOVASCULAR: S1 and S2 present, regular. No murmur.  ABDOMEN: Soft, nontender. Bowel sounds  present. No organomegaly.  SKIN: No rashes.  EXTREMITIES: Legs: No edema.  NEUROLOGICAL: Power 4/5. Generalized weakness. Moves all 4 limbs. No tremors or rigidity.  JOINTS: No swelling or tenderness.  PSYCHIATRIC: Does not appear in any acute psychiatric illness at this time.   IMPORTANT LABORATORY RESULTS: Glucose 110.  BNP 188. BUN 9, creatinine 0.94, sodium 135, potassium 4.1, chloride 104, CO2 of 27, calcium 9.5. Troponin less than 0.02. WBC 11.2, hemoglobin 13.8, platelet count 187. CT scan of the chest with contrast is done, which showed no CT evidence of pulmonary embolism, but shows atelectasis versus infiltrate in lung bases.   ASSESSMENT AND PLAN: A 64 year old female with past medical history of chronic obstructive pulmonary disease, current smoker, depression and anxiety, came with worsening shortness of breath for the last 2 to 3 days.  1.  Chronic obstructive pulmonary disease exacerbation. We will give her IV steroid, nebulizer and Spiriva. We will also give Rocephin and Zithromax to cover for any infection.  2.  Acute respiratory failure. The patient is not using any oxygen at home. Currently, during my examination, just on talking to me, her oxygen level is dropping down to 88 on room air, and she says that she is feeling extremely short of breath on exertion, so this is acute respiratory failure. We will have to re-evaluate her for need of home oxygen on exertion if it is dropping down, once her COPD is under control after treatment at the time of discharge.  3.  Depression. We will continue home medication as she was taking before.  4.  Current smoker. Counseling is done for 5 minutes. She says she will not need a nicotine patch here in the hospital, but she is willing to quit once she goes home.  5.  Anxiety. We will continue chloroquine and diazepam as she was taking at home on p.r.n. basis.  6.  Gastroesophageal reflux disease. We will continue pantoprazole in the hospital.    CODE STATUS: FULL CODE.   TOTAL TIME SPENT: 50 minutes in admission.   ____________________________ Ceasar Lund Anselm Jungling, MD vgv:dmm D: 04/03/2013 20:34:52 ET T: 04/03/2013 21:11:13 ET JOB#: [id number]  cc: Ceasar Lund. Anselm Jungling, MD, <Dictator> Vaughan Basta MD ELECTRONICALLY SIGNED 04/22/2013 8:07

## 2014-09-26 NOTE — Discharge Summary (Signed)
PATIENT NAME:  Stephanie [name] MR#:  [id number] DATE OF BIRTH:  07-Apr-1951  PRESENTING COMPLAINT: Shortness of breath.   DISCHARGE DIAGNOSES:  Acute-on-chronic hypoxic respiratory failure due to chronic obstructive pulmonary disease flare.  1.  Tobacco abuse.  2.  Anxiety.  3.  Gastroesophageal reflux disease.  4.  Type 2 diabetes.    CONDITION ON DISCHARGE: Fair. Sats 94% on room air.   CODE STATUS: FULL CODE.   MEDICATIONS:  1.  Abilify 30 mg p.o. daily.  2.  Colace 100 mg b.i.d.  3.  Lidoderm patch affected area daily.  4.  Omeprazole 40 mg daily.  5.  Ambien 10 mg at bedtime.  6.  Diazepam 5 mg b.i.d. as needed.  7.  Klonopin 0.5 mg b.i.d. as needed.  8.  Lamictal 100 mg three tablets daily.  9.  Phenergan 0.5 to one tablet every 8 hours as needed.  10.  Quetiapine 400 mg one tablet once a day.  11.  Requip 3 mg one tablet once a day at bedtime.  12.  Tramadol 50 mg two tablets twice a day as needed for pain.  13.  Remeron 15 mg half tablet p.o. daily at bedtime.  14.  Salicylic acid topical 6% topical gel, apply to affected area.  15.  Spiriva 18 mcg inhalation 1 capsule inhalation daily.  16.  Detrol LA 2 mg extended release p.o. daily.  17.  Ventolin HFA 90 mcg two puffs every 6 hours as needed.  18.  Vitamin D3 400 international units daily.  19.  Estradiol 0.1 mg patch transdermal twice a week.  20.  Lidex apply to affected area b.i.d.  21.  Nystatin apply topically to affected area b.i.d.  22.  Prednisone taper as directed.  23.  Azithromycin 250 p.o. daily.  24.  Metformin extended release 500 mg daily.   DIET: ADA 1800-calorie diet.   FOLLOWUP: With Dr. [name] in 1 to 2 weeks.   The patient advised smoking cessation.   Chest x-ray: There is elevation of the right hemidiaphragm, some minimal bibasilar atelectasis. No effusion or pneumothorax.   CBC within normal limits. Basic metabolic panel within normal limits except sodium 131, potassium of 3.0.  Hemoglobin A1c 6.   Blood cultures negative in 8 to 12 hours.  CT of the chest negative for any PE. Positive for hepatic steatosis.   BRIEF SUMMARY OF HOSPITAL COURSE: Stephanie [name] is a pleasant 64 year old obese Caucasian female with history of COPD, current smoke, depression and anxiety along with type 2 diabetes who presents to the Emergency Room with:  1.  COPD exacerbation. The patient had shortness of breath for last 2 to 3 days, received IV steroids, nebulizers, and Spiriva. Her IV antibiotics were changed to p.o. Zithromax and she will finish her p.o. steroid at home. She was stressed to smoking cessation. She is agreeable to it. 2.  Acute hypoxic respiratory failure due to COPD flare, improved steroids, nebulizers and oxygen. Her stats are 94% on room air.  2.  Depression/anxiety. Continue Klonopin, diazepam, and rest of her psych meds at home.  3.  Smoking cessation was counseled.  4.  GERD. Continue PPIs.   Hospital stay otherwise remained stable.   CODE STATUS: REMAINS FULL CODE.  TIME SPENT: 40 minutes.  ____________________________ Hart Rochester Posey Pronto, MD sap:np D: 04/04/2013 15:24:41 ET T: 04/04/2013 17:50:57 ET JOB#: [id number]  cc: Makynna Manocchio A. Posey Pronto, MD, <Dictator> Ilda Basset MD ELECTRONICALLY SIGNED 04/11/2013 13:27

## 2014-09-29 ENCOUNTER — Encounter: Payer: Self-pay | Admitting: Gastroenterology

## 2014-09-29 LAB — CBC CANCER CENTER
BASOS PCT: 1.2 %
Basophil #: 0.1 x10 3/mm (ref 0.0–0.1)
EOS ABS: 0.2 x10 3/mm (ref 0.0–0.7)
Eosinophil %: 5 %
HCT: 34.7 % — ABNORMAL LOW (ref 35.0–47.0)
HGB: 11.7 g/dL — ABNORMAL LOW (ref 12.0–16.0)
LYMPHS PCT: 18.9 %
Lymphocyte #: 0.9 x10 3/mm — ABNORMAL LOW (ref 1.0–3.6)
MCH: 31.2 pg (ref 26.0–34.0)
MCHC: 33.6 g/dL (ref 32.0–36.0)
MCV: 93 fL (ref 80–100)
MONOS PCT: 8.6 %
Monocyte #: 0.4 x10 3/mm (ref 0.2–0.9)
Neutrophil #: 3.1 x10 3/mm (ref 1.4–6.5)
Neutrophil %: 66.3 %
PLATELETS: 102 x10 3/mm — AB (ref 150–440)
RBC: 3.74 10*6/uL — ABNORMAL LOW (ref 3.80–5.20)
RDW: 14.4 % (ref 11.5–14.5)
WBC: 4.7 x10 3/mm (ref 3.6–11.0)

## 2014-09-29 LAB — CREATININE, SERUM
Creatinine: 0.73 mg/dL
EGFR (African American): >60
EGFR (Non-African Amer.): >60

## 2014-09-29 LAB — IRON AND TIBC
Iron Bind.Cap.(Total): 328 (ref 250–450)
Iron Saturation: 18.6
Iron: 61 ug/dL
Unbound Iron-Bind.Cap.: 267.3

## 2014-09-29 LAB — FERRITIN: Ferritin (ARMC): 75 ng/mL

## 2014-09-29 LAB — RETICULOCYTES
Absolute Retic Count: 0.0904 10*6/uL (ref 0.019–0.186)
RETICULOCYTE: 2.42 % (ref 0.4–3.1)

## 2014-10-01 LAB — PROT IMMUNOELECTROPHORES(ARMC)

## 2014-10-01 LAB — KAPPA/LAMBDA FREE LIGHT CHAINS (ARMC)

## 2014-10-02 LAB — HM DIABETES EYE EXAM

## 2014-10-03 ENCOUNTER — Telehealth: Payer: Self-pay | Admitting: *Deleted

## 2014-10-03 NOTE — Telephone Encounter (Signed)
Faxed form received indicating [place] required for pts lidocaine patch. Form completed and placed in Dr Hulen Shouts inbox for signature

## 2014-10-06 ENCOUNTER — Inpatient Hospital Stay: Payer: Commercial Managed Care - HMO | Attending: Internal Medicine

## 2014-10-06 DIAGNOSIS — D696 Thrombocytopenia, unspecified: Secondary | ICD-10-CM | POA: Diagnosis present

## 2014-10-06 DIAGNOSIS — F419 Anxiety disorder, unspecified: Secondary | ICD-10-CM | POA: Diagnosis not present

## 2014-10-06 DIAGNOSIS — Z86010 Personal history of colonic polyps: Secondary | ICD-10-CM | POA: Insufficient documentation

## 2014-10-06 DIAGNOSIS — E119 Type 2 diabetes mellitus without complications: Secondary | ICD-10-CM | POA: Insufficient documentation

## 2014-10-06 DIAGNOSIS — E785 Hyperlipidemia, unspecified: Secondary | ICD-10-CM | POA: Insufficient documentation

## 2014-10-06 DIAGNOSIS — M129 Arthropathy, unspecified: Secondary | ICD-10-CM | POA: Diagnosis not present

## 2014-10-06 DIAGNOSIS — R161 Splenomegaly, not elsewhere classified: Secondary | ICD-10-CM | POA: Diagnosis not present

## 2014-10-06 DIAGNOSIS — Z87440 Personal history of urinary (tract) infections: Secondary | ICD-10-CM | POA: Diagnosis not present

## 2014-10-06 DIAGNOSIS — R32 Unspecified urinary incontinence: Secondary | ICD-10-CM | POA: Insufficient documentation

## 2014-10-06 DIAGNOSIS — Z87891 Personal history of nicotine dependence: Secondary | ICD-10-CM | POA: Insufficient documentation

## 2014-10-06 DIAGNOSIS — K219 Gastro-esophageal reflux disease without esophagitis: Secondary | ICD-10-CM | POA: Insufficient documentation

## 2014-10-06 DIAGNOSIS — K760 Fatty (change of) liver, not elsewhere classified: Secondary | ICD-10-CM | POA: Insufficient documentation

## 2014-10-06 DIAGNOSIS — F319 Bipolar disorder, unspecified: Secondary | ICD-10-CM | POA: Diagnosis not present

## 2014-10-06 DIAGNOSIS — Z8673 Personal history of transient ischemic attack (TIA), and cerebral infarction without residual deficits: Secondary | ICD-10-CM | POA: Insufficient documentation

## 2014-10-06 DIAGNOSIS — K7460 Unspecified cirrhosis of liver: Secondary | ICD-10-CM | POA: Diagnosis not present

## 2014-10-06 DIAGNOSIS — D649 Anemia, unspecified: Secondary | ICD-10-CM | POA: Diagnosis not present

## 2014-10-06 DIAGNOSIS — J45909 Unspecified asthma, uncomplicated: Secondary | ICD-10-CM | POA: Insufficient documentation

## 2014-10-06 DIAGNOSIS — Z8619 Personal history of other infectious and parasitic diseases: Secondary | ICD-10-CM | POA: Diagnosis not present

## 2014-10-06 DIAGNOSIS — F329 Major depressive disorder, single episode, unspecified: Secondary | ICD-10-CM | POA: Diagnosis not present

## 2014-10-06 DIAGNOSIS — R0602 Shortness of breath: Secondary | ICD-10-CM | POA: Diagnosis not present

## 2014-10-06 DIAGNOSIS — G2581 Restless legs syndrome: Secondary | ICD-10-CM | POA: Insufficient documentation

## 2014-10-06 DIAGNOSIS — G4730 Sleep apnea, unspecified: Secondary | ICD-10-CM | POA: Insufficient documentation

## 2014-10-06 LAB — CBC
HEMATOCRIT: 35.9 % (ref 35.0–47.0)
Hemoglobin: 12 g/dL (ref 12.0–16.0)
MCH: 31.4 pg (ref 26.0–34.0)
MCHC: 33.4 g/dL (ref 32.0–36.0)
MCV: 94.1 fL (ref 80.0–100.0)
Platelets: 117 10*3/uL — ABNORMAL LOW (ref 150–440)
RBC: 3.82 MIL/uL (ref 3.80–5.20)
RDW: 14.2 % (ref 11.5–14.5)
WBC: 5 10*3/uL (ref 3.6–11.0)

## 2014-10-06 IMAGING — CR DG CHEST 2V
1 series · 2 of 2 positions shown · non-contrast
Comparison: Prior CT from 12/26/2013

CLINICAL DATA: Shortness of breath and chest pain

EXAM:
CHEST  2 VIEW

[Series 1: dxr chest pa (or ap) and lateral · 0.14mm/px · 2 of 2 slices shown]
[im 1/2]
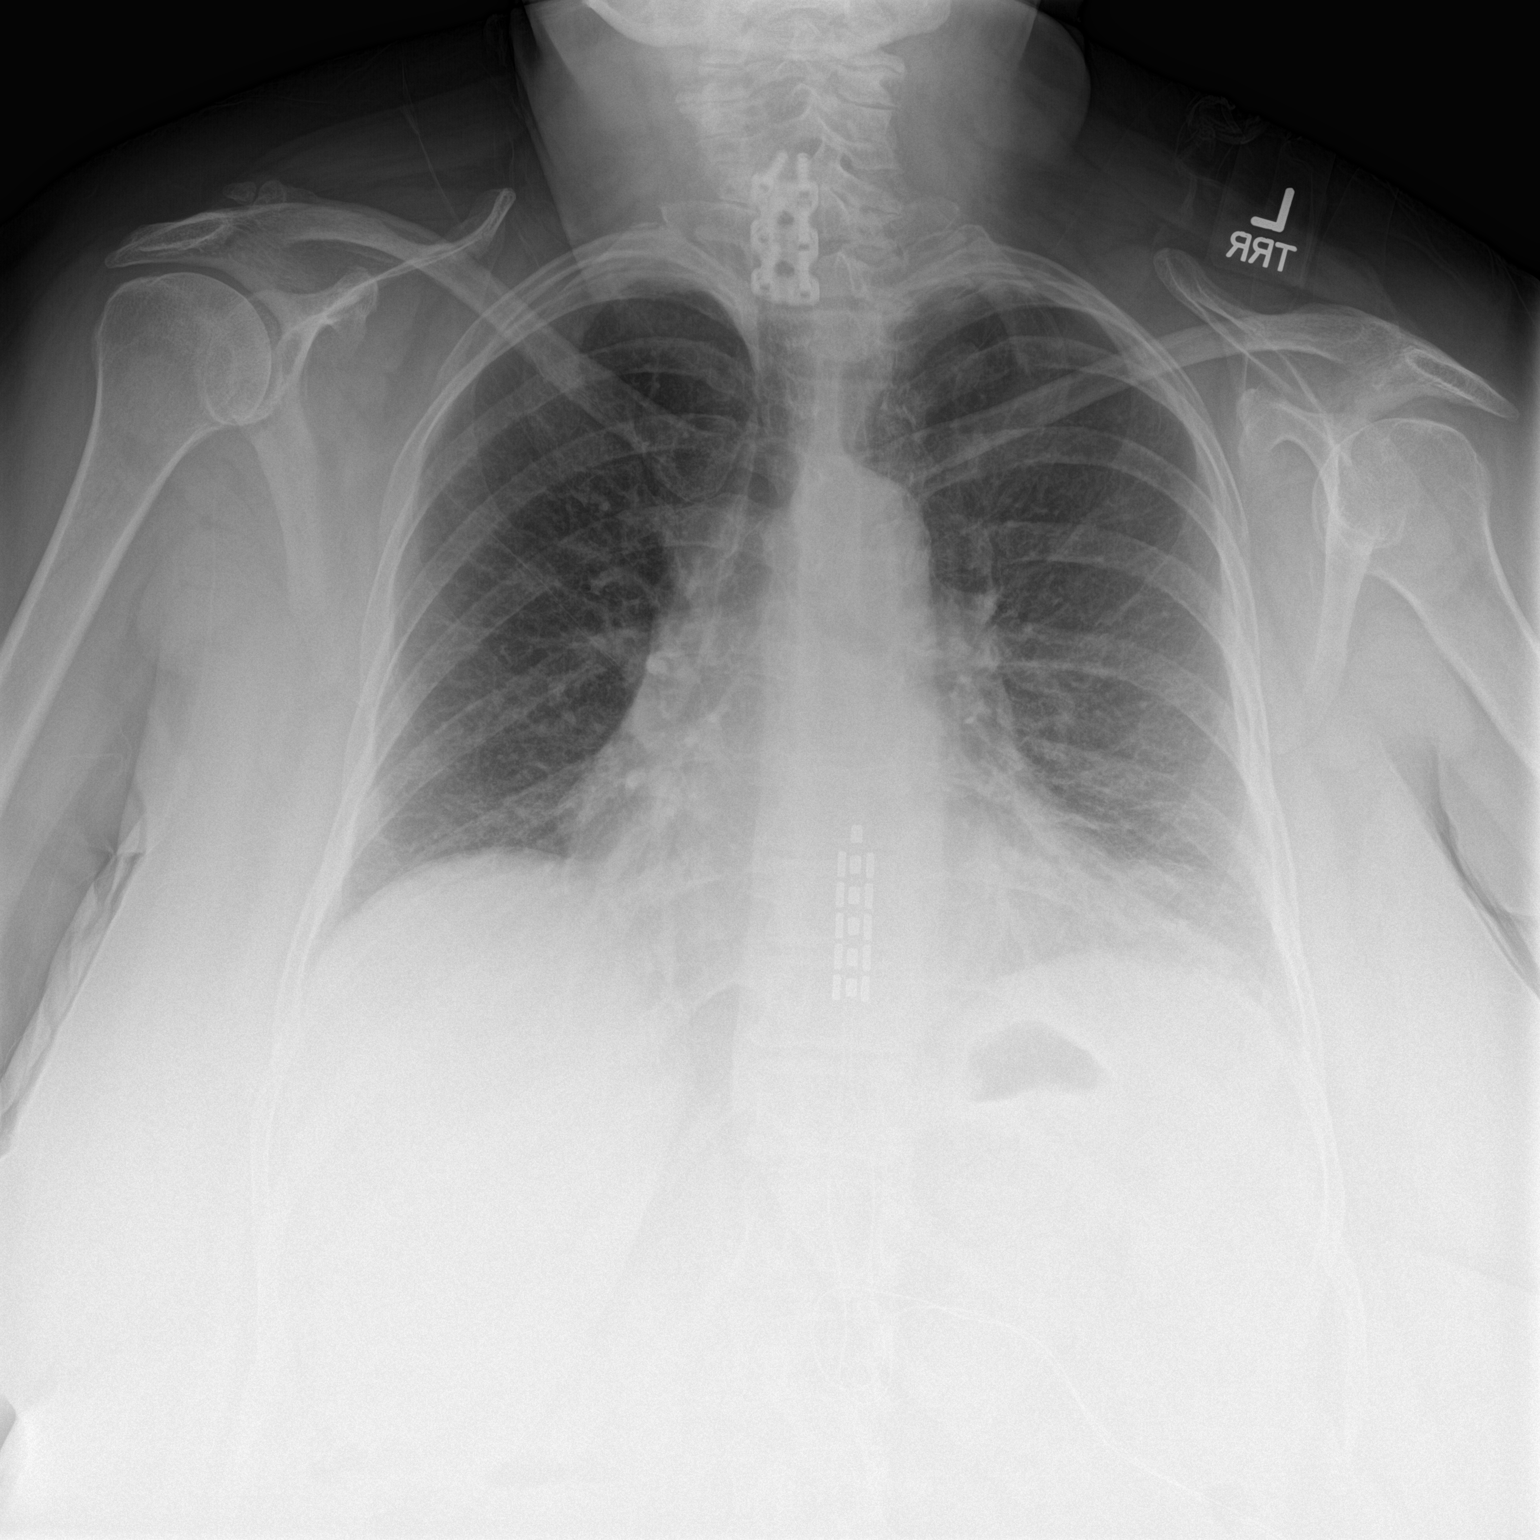
[im 2/2]
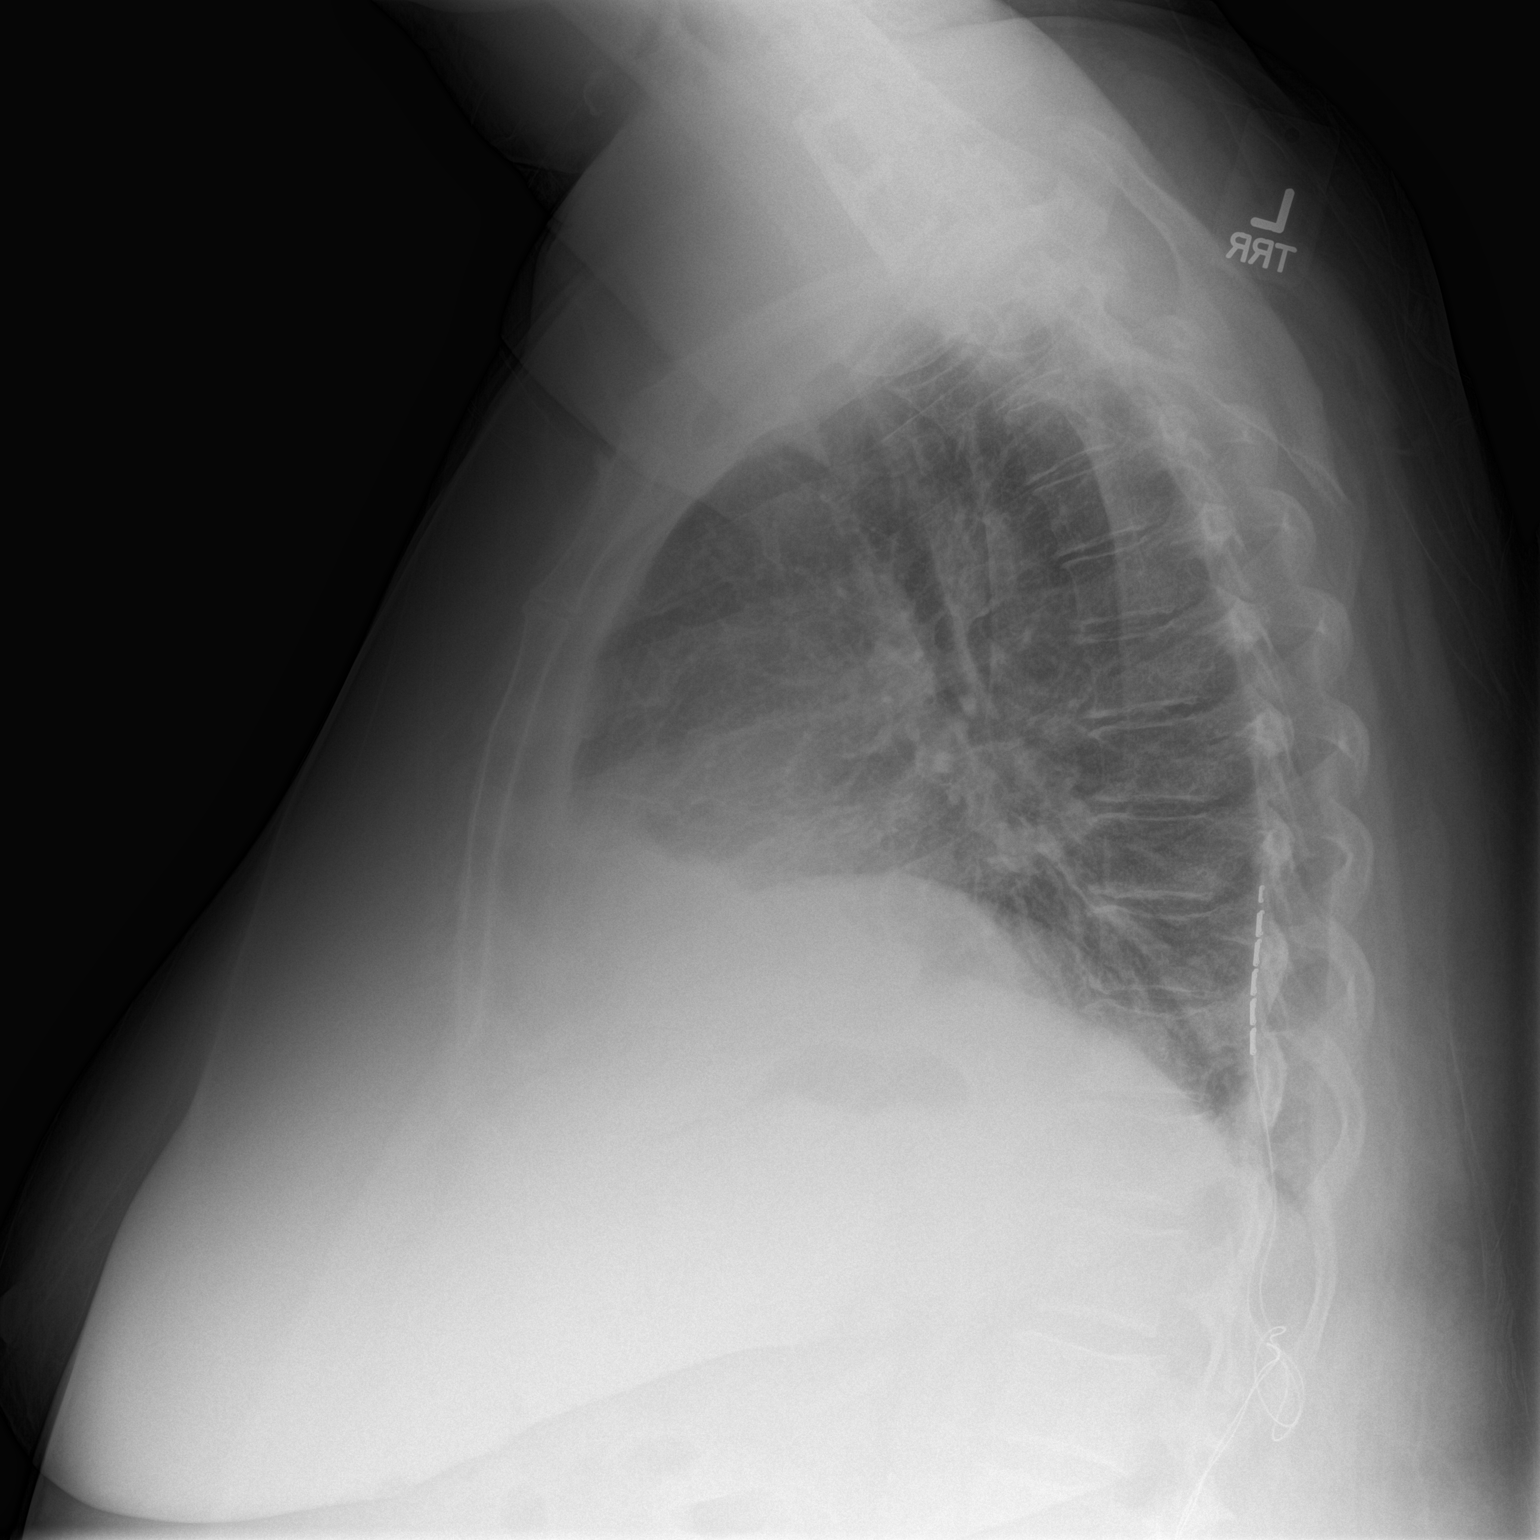

[2 of 2 positions shown; findings below may reference images not displayed]

FINDINGS: The cardiac and mediastinal silhouettes are stable in size and
contour, and remain within normal limits.

Lungs are mildly hypoinflated with mild bibasilar atelectasis. No
airspace consolidation, pleural effusion, or pulmonary edema is
identified. There is no pneumothorax.

ACDF overlies the cervical spine. Spinal stimulator device in place.
IMPRESSION: Shallow lung inflation with mild bibasilar atelectasis. No other
acute cardiopulmonary abnormality.

## 2014-10-06 IMAGING — CT CT ANGIO CHEST
2 of 6 series · 18 of 36 positions shown · IV contrast (APPLIED)
Comparison: CT chest 12/26/2013

CLINICAL DATA: Midsternal chest pain started last with forced
inspiration.

EXAM:
CT ANGIOGRAPHY CHEST WITH CONTRAST
TECHNIQUE: Multidetector CT imaging of the chest was performed using the
standard protocol during bolus administration of intravenous
contrast. Multiplanar CT image reconstructions and MIPs were
obtained to evaluate the vascular anatomy.
CONTRAST:  100 mL Isovue 370

[Series 6: pe 1.0 thins · axial · 0.64mm/px · z∈[-548,-328]mm · 17 of 248 slices shown]
[im 14/248  lung]
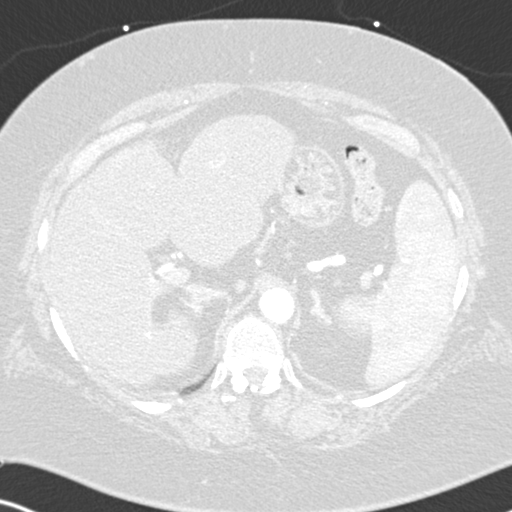
[im 28/248  mediastinal]
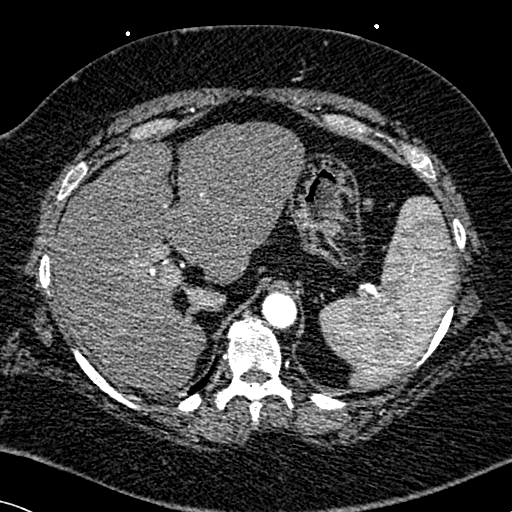
[im 42/248  lung]
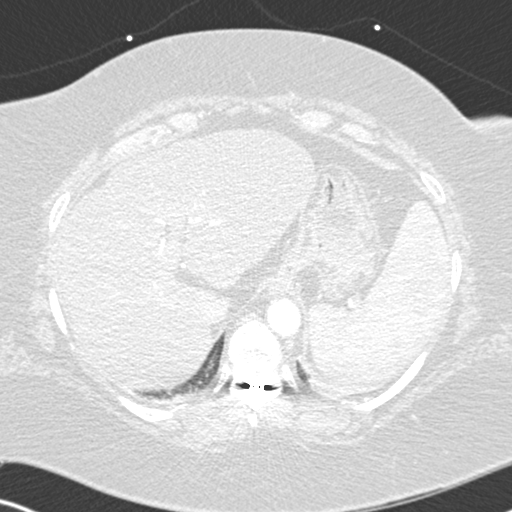
[im 55/248  mediastinal]
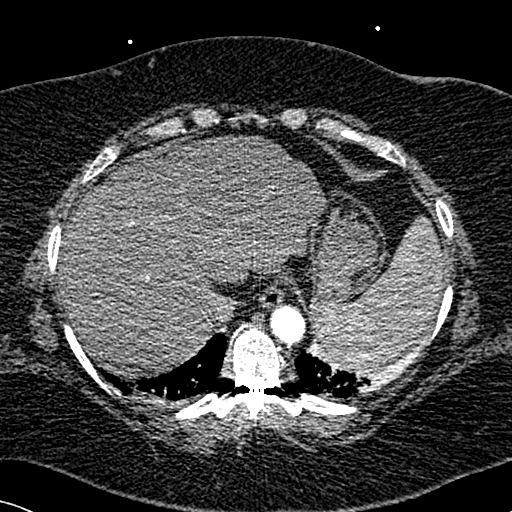
[im 69/248  lung]
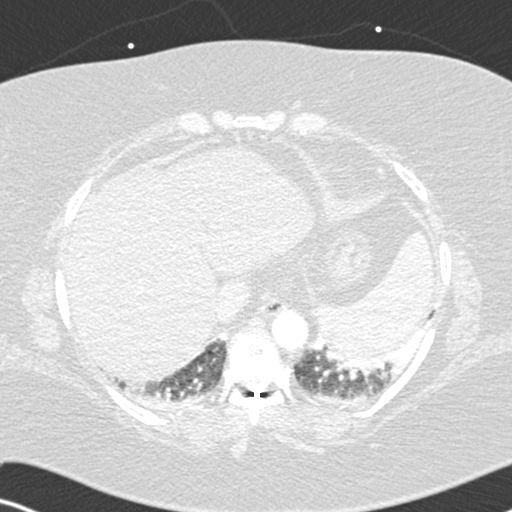
[im 83/248  mediastinal]
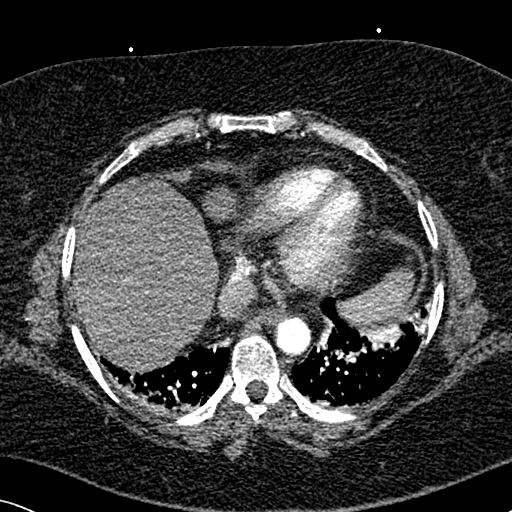
[im 97/248  lung]
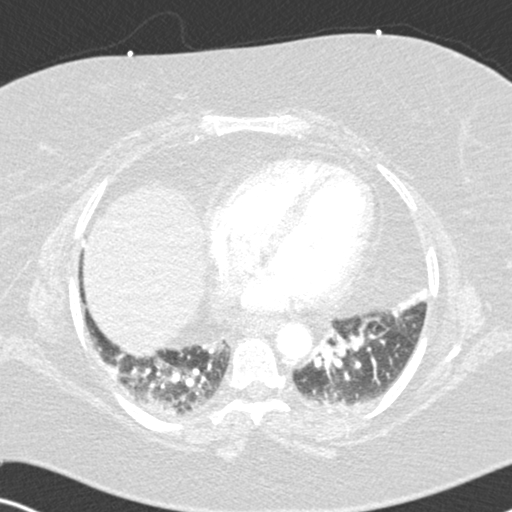
[im 110/248  mediastinal]
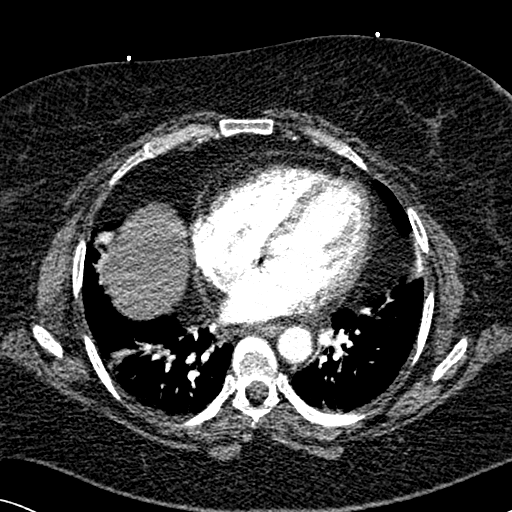
[im 124/248  lung]
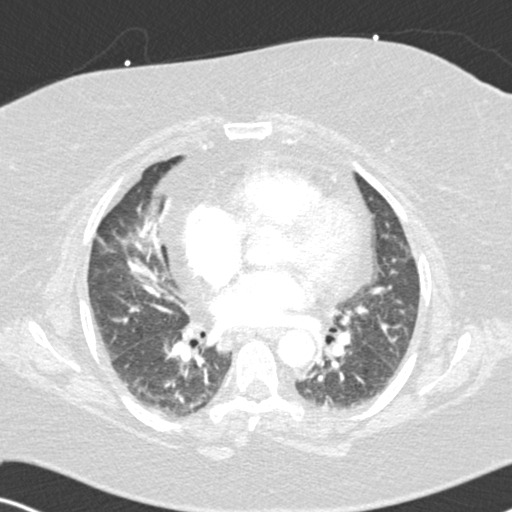
[im 138/248  mediastinal]
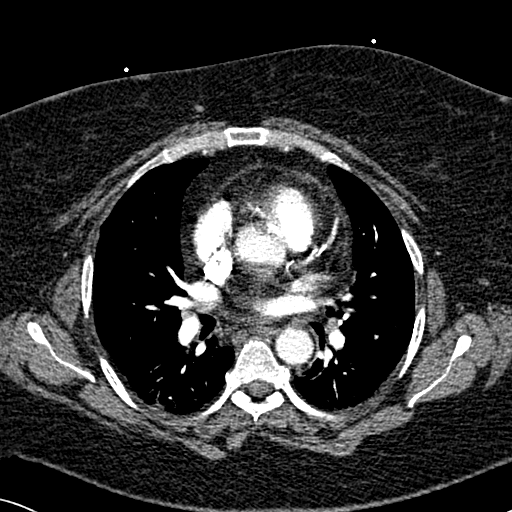
[im 151/248  lung]
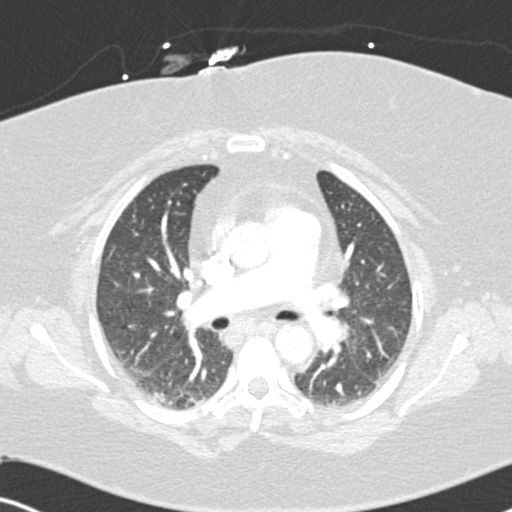
[im 165/248  mediastinal]
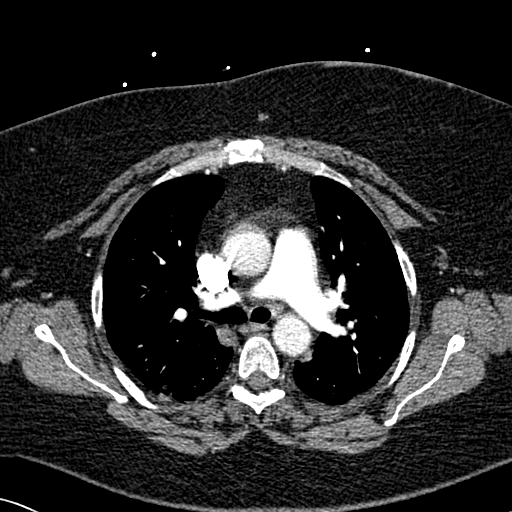
[im 179/248  lung]
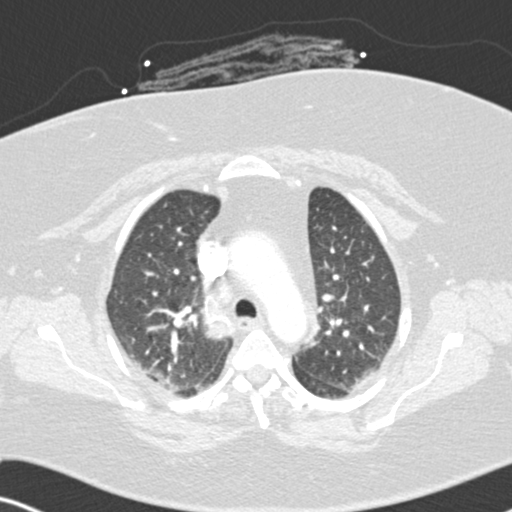
[im 193/248  mediastinal]
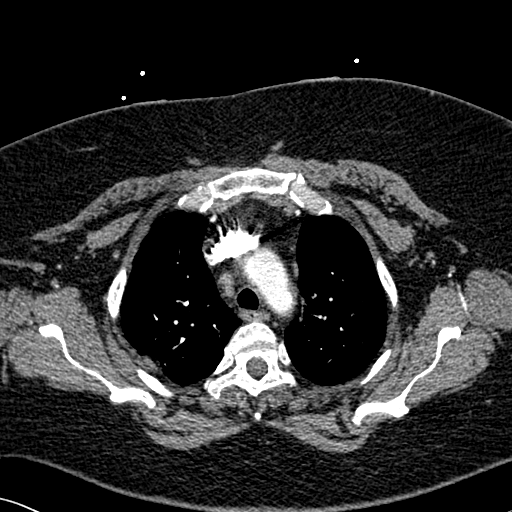
[im 206/248  lung]
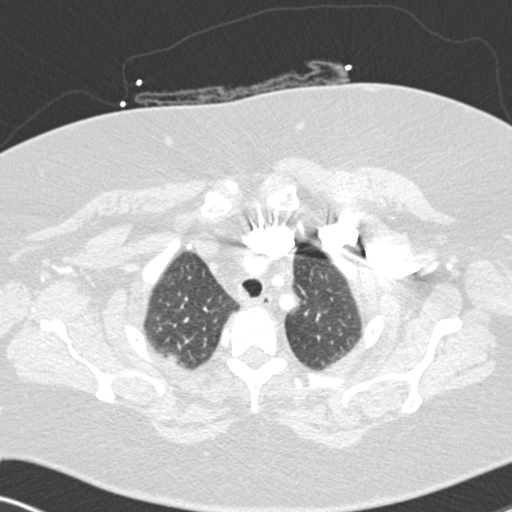
[im 220/248  mediastinal]
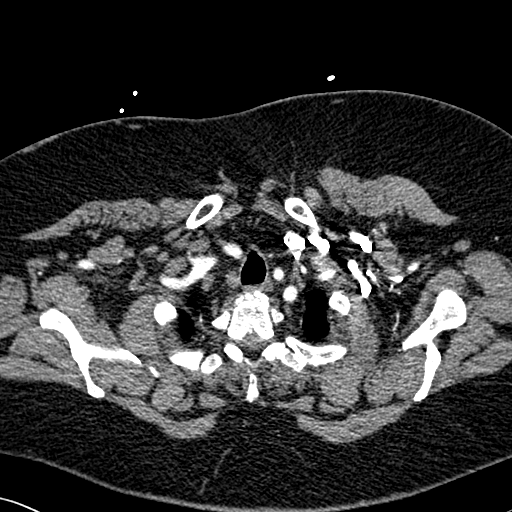
[im 234/248  lung]
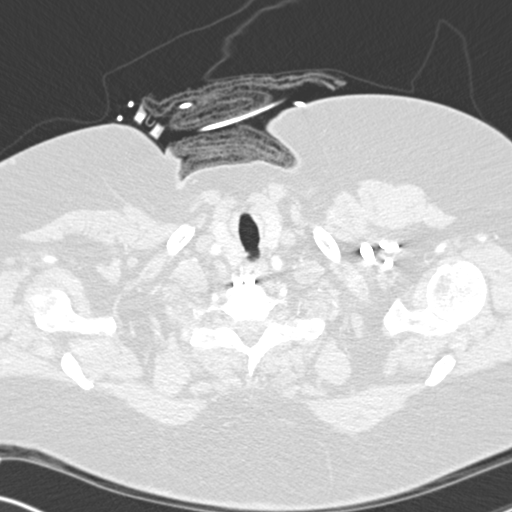

[Series 8: cor pe 2.0 mpr · coronal · 0.49mm/px · 1 of 141 slices shown]
[im 71/141  mediastinal]
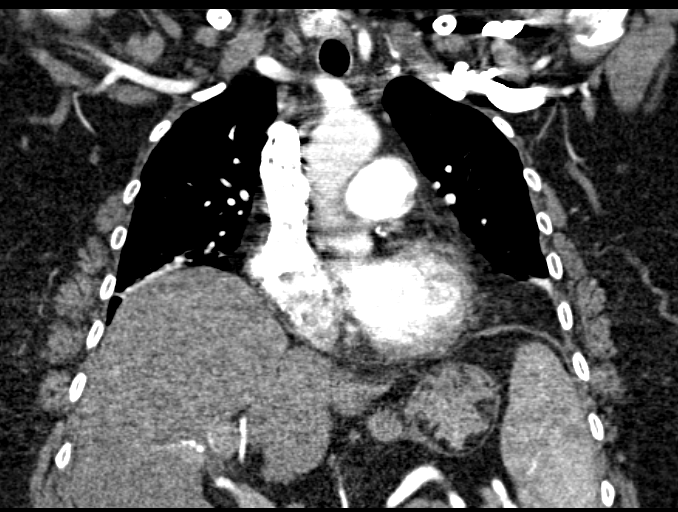

[18 of 36 positions shown; findings below may reference images not displayed]

FINDINGS: Stable cardiomegaly. Coronary artery atherosclerotic calcifications
noted. Thoracic aorta is normal caliber and enhancement.

Central pulmonary arteries normal caliber. Satisfactory evaluation
of the pulmonary arterial tree. No focal filling defects are
identified in the pulmonary arteries.

Negative for pleural or pericardial effusion. Are scattered
mediastinal lymph nodes. Ten the subcarinal station, 11 mm short
axis subcarinal lymph node is stable. There are other smaller lymph
nodes in the mediastinum. Esophagus unremarkable.

Moderate areas of atelectasis in both lower lobes. Scattered areas
of atelectasis in the right middle lobe. No definite airspace
disease. Question very mild/early centrilobular emphysema. Correlate
with smoking history.

There are some secretions in the midline posterior trachea on image
number 19 and in the right posterolateral aspect of the trachea on
image number 22 of series 7.

Thoracic spine vertebral bodies are normal in height and alignment.
There are postsurgical changes of anterior cervical discectomy and
fusion visible at C6-7. Thoracic spine stimulator leads are seen
spinal canal posterior to the lower thoracic spine.

Images of the upper abdomen demonstrate a nodular contour of the
liver with prominence of the left hepatic lobe, suggesting hepatic
cirrhosis. Imaged portion of the spleen appears of normal size, but
the entire spleen is not included on images. No for ascites is seen.

Review of the MIP images confirms the above findings.
IMPRESSION: 1. Negative for pulmonary embolism.
2. There are some secretions layering dependently within the
trachea. Question if the patient is at risk for aspiration.
3. Bilateral areas of atelectasis, most prominent in the lower lobes
bilaterally.

4. Findings suggestive of hepatic cirrhosis.
5. Question early/mild centrilobular emphysema.

## 2014-10-06 NOTE — Telephone Encounter (Signed)
Completed form faxed back to requested party;awaiting response

## 2014-10-07 ENCOUNTER — Other Ambulatory Visit: Payer: Self-pay | Admitting: Family Medicine

## 2014-10-07 NOTE — Telephone Encounter (Signed)
Fax received indicating a denial for pts lidocaine patch. According to the insurance [employer] has approved alternate medications for pts Dx. Pt notified and was advised to contact her insurance company and verify which medications and contact office back for new Rx. Pt verbally expressed understanding, and will cb as advised

## 2014-10-08 NOTE — Telephone Encounter (Signed)
Rx faxed to requested pharmacy

## 2014-10-08 NOTE — Telephone Encounter (Signed)
Last f/u 08/2014-CPE

## 2014-10-10 ENCOUNTER — Encounter: Payer: Self-pay | Admitting: Family Medicine

## 2014-10-27 ENCOUNTER — Inpatient Hospital Stay: Payer: Commercial Managed Care - HMO | Admitting: Internal Medicine

## 2014-10-27 ENCOUNTER — Other Ambulatory Visit: Payer: Commercial Managed Care - HMO

## 2014-10-31 ENCOUNTER — Other Ambulatory Visit: Payer: Self-pay

## 2014-10-31 ENCOUNTER — Inpatient Hospital Stay: Payer: Commercial Managed Care - HMO

## 2014-10-31 ENCOUNTER — Inpatient Hospital Stay (HOSPITAL_BASED_OUTPATIENT_CLINIC_OR_DEPARTMENT_OTHER): Payer: Commercial Managed Care - HMO | Admitting: Hematology and Oncology

## 2014-10-31 VITALS — BP 114/70 | HR 101 | Temp 96.5°F | Ht 63.0 in | Wt 217.8 lb

## 2014-10-31 DIAGNOSIS — G4730 Sleep apnea, unspecified: Secondary | ICD-10-CM

## 2014-10-31 DIAGNOSIS — R161 Splenomegaly, not elsewhere classified: Secondary | ICD-10-CM | POA: Diagnosis not present

## 2014-10-31 DIAGNOSIS — K7460 Unspecified cirrhosis of liver: Secondary | ICD-10-CM | POA: Diagnosis not present

## 2014-10-31 DIAGNOSIS — R32 Unspecified urinary incontinence: Secondary | ICD-10-CM

## 2014-10-31 DIAGNOSIS — D696 Thrombocytopenia, unspecified: Secondary | ICD-10-CM | POA: Diagnosis not present

## 2014-10-31 DIAGNOSIS — Z8673 Personal history of transient ischemic attack (TIA), and cerebral infarction without residual deficits: Secondary | ICD-10-CM

## 2014-10-31 DIAGNOSIS — R0602 Shortness of breath: Secondary | ICD-10-CM

## 2014-10-31 DIAGNOSIS — G2581 Restless legs syndrome: Secondary | ICD-10-CM

## 2014-10-31 DIAGNOSIS — F329 Major depressive disorder, single episode, unspecified: Secondary | ICD-10-CM

## 2014-10-31 DIAGNOSIS — F319 Bipolar disorder, unspecified: Secondary | ICD-10-CM

## 2014-10-31 DIAGNOSIS — E785 Hyperlipidemia, unspecified: Secondary | ICD-10-CM

## 2014-10-31 DIAGNOSIS — K760 Fatty (change of) liver, not elsewhere classified: Secondary | ICD-10-CM

## 2014-10-31 DIAGNOSIS — Z86010 Personal history of colonic polyps: Secondary | ICD-10-CM

## 2014-10-31 DIAGNOSIS — Z87891 Personal history of nicotine dependence: Secondary | ICD-10-CM

## 2014-10-31 DIAGNOSIS — Z87440 Personal history of urinary (tract) infections: Secondary | ICD-10-CM

## 2014-10-31 DIAGNOSIS — D649 Anemia, unspecified: Secondary | ICD-10-CM

## 2014-10-31 DIAGNOSIS — M129 Arthropathy, unspecified: Secondary | ICD-10-CM

## 2014-10-31 DIAGNOSIS — F419 Anxiety disorder, unspecified: Secondary | ICD-10-CM

## 2014-10-31 DIAGNOSIS — E119 Type 2 diabetes mellitus without complications: Secondary | ICD-10-CM

## 2014-10-31 DIAGNOSIS — K219 Gastro-esophageal reflux disease without esophagitis: Secondary | ICD-10-CM

## 2014-10-31 DIAGNOSIS — J45909 Unspecified asthma, uncomplicated: Secondary | ICD-10-CM

## 2014-10-31 DIAGNOSIS — Z8619 Personal history of other infectious and parasitic diseases: Secondary | ICD-10-CM

## 2014-10-31 LAB — CBC
HCT: 36.3 % (ref 35.0–47.0)
Hemoglobin: 12 g/dL (ref 12.0–16.0)
MCH: 30.7 pg (ref 26.0–34.0)
MCHC: 33.1 g/dL (ref 32.0–36.0)
MCV: 92.8 fL (ref 80.0–100.0)
Platelets: 119 10*3/uL — ABNORMAL LOW (ref 150–440)
RBC: 3.91 MIL/uL (ref 3.80–5.20)
RDW: 14.2 % (ref 11.5–14.5)
WBC: 5.5 10*3/uL (ref 3.6–11.0)

## 2014-10-31 NOTE — Progress Notes (Signed)
Pt here today for follow up regarding thrombocytopenia; c/o feeling weak and fatigued for a week and a half; no fever; no other symtoms

## 2014-11-01 LAB — CMV IGM: CMV IgM: <30 AU/mL (ref 0.0–29.9)

## 2014-11-01 LAB — HEPATITIS B CORE ANTIBODY, TOTAL: Hep B Core Total Ab: NEGATIVE

## 2014-11-05 ENCOUNTER — Other Ambulatory Visit: Payer: Self-pay | Admitting: Family Medicine

## 2014-11-05 NOTE — Telephone Encounter (Signed)
Last f/u appt 08/2014-CPE

## 2014-11-05 NOTE — Telephone Encounter (Signed)
Rx called in to requested pharmacy 

## 2014-11-06 ENCOUNTER — Telehealth: Payer: Self-pay | Admitting: *Deleted

## 2014-11-06 NOTE — Telephone Encounter (Signed)
Told her labs are stable

## 2014-11-09 ENCOUNTER — Other Ambulatory Visit: Payer: Self-pay | Admitting: Family Medicine

## 2014-11-09 ENCOUNTER — Other Ambulatory Visit: Payer: Self-pay | Admitting: Pulmonary Disease

## 2014-11-10 NOTE — Telephone Encounter (Signed)
Last f/u appt 08/2014-CPE

## 2014-11-11 ENCOUNTER — Other Ambulatory Visit: Payer: Self-pay | Admitting: Pulmonary Disease

## 2014-11-11 NOTE — Telephone Encounter (Signed)
Rx called in to requested pharmacy 

## 2014-11-14 ENCOUNTER — Encounter: Payer: Self-pay | Admitting: Internal Medicine

## 2014-11-14 ENCOUNTER — Ambulatory Visit (INDEPENDENT_AMBULATORY_CARE_PROVIDER_SITE_OTHER): Payer: Commercial Managed Care - HMO | Admitting: Internal Medicine

## 2014-11-14 VITALS — BP 118/78 | HR 99 | Temp 98.2°F | Wt 226.0 lb

## 2014-11-14 DIAGNOSIS — B372 Candidiasis of skin and nail: Secondary | ICD-10-CM | POA: Diagnosis not present

## 2014-11-14 MED ORDER — FLUCONAZOLE 100 MG PO TABS: 100.0000 mg | ORAL_TABLET | Freq: Every day | ORAL | Status: DC

## 2014-11-14 NOTE — Progress Notes (Signed)
Subjective:    Patient ID: Stephanie [name], female    DOB: [birth year], 64 y.o.   MRN: [id number]  HPI  Pt presents to the clinic today with c/o right groin pain and irritation. This started 4 days ago. She has also noticed some bleeding in the area. She thinks it may be a yeast infection. She has had something similar to this in her left groin before. She reports she has tried Nystatin powder and cream without relief.   Review of Systems      Past Medical History  Diagnosis Date  . Adenomatous colon polyp   . Insomnia   . RLS (restless legs syndrome)   . Colon polyp   . Incontinence   . Hepatitis B, chronic   . H/O: rheumatic fever   . Hyperlipidemia   . Unspecified disorders of nervous system   . Bipolar affective disorder     h/o  . H/O: CVA (cardiovascular accident)     TIA  . Fatty liver 04/09/08    found in abd CT  . Shortness of breath   . Obstructive sleep apnea     not using CPAP, last sleep study 2.5 years ago, per pt doctor is aware  . Blood transfusion 2000  . GERD (gastroesophageal reflux disease)   . Arthritis   . Aortic dissection     Type 1  . Asthma   . Anxiety   . Depression   . Stroke     TIA- 2002  . Ulcer   . Diabetes mellitus without complication   . Anemia   . Blood transfusion without reported diagnosis   . Sleep apnea   . Platelets decreased     Current Outpatient Prescriptions  Medication Sig Dispense Refill  . ABILIFY 30 MG tablet TAKE 1 TABLET BY MOUTH EVERY MORNING 30 tablet 0  . ammonium lactate (AMLACTIN) 12 % cream     . Blood Glucose Monitoring Suppl (ACCU-CHEK AVIVA) device Use as instructed to test blood sugar once daily Dx E11.8 (Patient taking differently: Use as instructed to test blood sugar twice daily) 1 each 0  . clonazePAM (KLONOPIN) 0.5 MG tablet TAKE 1 TABLET BY MOUTH TWICE A DAY 60 tablet 0  . diazepam (VALIUM) 5 MG tablet TAKE 1 TABLET BY MOUTH TWICE A DAY 60 tablet 0  . docusate sodium (COLACE) 100 MG capsule  TAKE 1 CAPSULE BY MOUTH 2 TIMES DAILY AS NEEDED FOR CONSTIPATION 60 capsule 3  . fluconazole (DIFLUCAN) 150 MG tablet Take 1 tablet (150 mg total) by mouth daily. 7 tablet 0  . glucose blood (ACCU-CHEK AVIVA) test strip Use as instructed to test blood sugar once daily Dx E11.8 100 each 3  . hyoscyamine (LEVBID) 0.375 MG 12 hr tablet Take 1 tablet (0.375 mg total) by mouth 2 (two) times daily. 60 tablet 0  . lamoTRIgine (LAMICTAL) 100 MG tablet Take 3 tablets by mouth once daily    . Lancets (ACCU-CHEK SOFT TOUCH) lancets Use as instructed to test blood sugar once daily Dx E11.8 100 each 3  . meloxicam (MOBIC) 7.5 MG tablet Take 1 tablet (7.5 mg total) by mouth daily. 30 tablet 0  . metFORMIN (GLUCOPHAGE) 1000 MG tablet Take 0.5 tablets (500 mg total) by mouth 2 (two) times daily with a meal. 180 tablet 0  . methscopolamine (PAMINE) 2.5 MG TABS tablet Take 1 tablet (2.5 mg total) by mouth 4 (four) times daily -  before meals and at bedtime. 120 tablet 3  .  mirtazapine (REMERON) 15 MG tablet TAKE 1 TABLET BY MOUTH AT BEDTIME 34 tablet 2  . nystatin (MYCOSTATIN/NYSTOP) 100000 UNIT/GM POWD Apply to affected area 4 times a day 60 g 1  . nystatin-triamcinolone ointment (MYCOLOG) APPLY 1 APPLICATION TOPICALLY 2 (TWO) TIMES DAILY. 30 g 0  . omeprazole (PRILOSEC) 40 MG capsule TAKE ONE CAPSULE BY MOUTH EVERY DAY 90 capsule 1  . ONETOUCH DELICA LANCETS FINE MISC Inject 30 g as directed 2 (two) times daily.   0  . promethazine (PHENERGAN) 25 MG tablet TAKE 1/2 TO 1 TABLET BY MOUTH EVERY 8 HOURS AS NEEDED FOR NAUSEA 90 tablet 2  . promethazine (PHENERGAN) 25 MG tablet TAKE 1/2 TO 1 TABLET BY MOUTH EVERY 8 HOURS AS NEEDED FOR NAUSEA 30 tablet 2  . rOPINIRole (REQUIP) 3 MG tablet TAKE 1 TABLET BY MOUTH AT BEDTIME 90 tablet 0  . salicylic acid 6 % gel APPLY TO AFFECTED AREA EVERY DAY *USE AT NIGHT AND RINSE OFF IN THE MORNING* 40 g 0  . simvastatin (ZOCOR) 10 MG tablet Take 1 tablet (10 mg total) by mouth at  bedtime. 90 tablet 3  . Spacer/Aero-Holding Chambers (AEROCHAMBER Z-STAT PLUS) inhaler Use as instructed 1 each 0  . SYMBICORT 160-4.5 MCG/ACT inhaler INHALE 2 PUFFS INTO THE LUNGS 2 (TWO) TIMES DAILY. 10.2 Inhaler 0  . tiotropium (SPIRIVA) 18 MCG inhalation capsule Place 18 mcg into inhaler and inhale daily.    Marland Kitchen tolterodine (DETROL LA) 2 MG 24 hr capsule TAKE ONE CAPSULE BY MOUTH DAILY 90 capsule 0  . traMADol (ULTRAM) 50 MG tablet TAKE 2 TABLETS BY MOUTH IN THE MORNING AND TAKE 2 TABLETS BY MOUTH IN THE EVENING 120 tablet 0  . urea (CARMOL) 40 % CREA Apply nightly to feet    . zolpidem (AMBIEN) 5 MG tablet TAKE 1 TABLET BY MOUTH AT BEDTIME AS NEEDED FOR SLEEP 30 tablet 0   No current facility-administered medications for this visit.    Allergies  Allergen Reactions  . Doxycycline Other (See Comments)    Face red and swollen; no difficulty breathing    Family History  Problem Relation Age of Onset  . Colon cancer Neg Hx   . Esophageal cancer Neg Hx   . Rectal cancer Neg Hx   . Breast cancer Mother   . Lung cancer Mother   . Stomach cancer Father   . Diabetes      4 aunts, and 1 uncle    History   Social History  . Marital Status: Divorced    Spouse Name: N/A  . Number of Children: 1  . Years of Education: N/A   Occupational History  . Disabled    Social History Main Topics  . Smoking status: Former Smoker -- 0.50 packs/day for 50 years    Types: Cigarettes    Quit date: 04/06/2013  . Smokeless tobacco: Never Used  . Alcohol Use: No  . Drug Use: No  . Sexual Activity: Not on file   Other Topics Concern  . Not on file   Social History Narrative   1 son, 40+ y/o   Daily caffeine use: 2 cups daily   Does not get regular exercise   Disability due to bipolar      Has a living will- she is a DNR (she has form at home per pt).     Constitutional: Denies fever, malaise, fatigue, headache or abrupt weight changes.  Respiratory: Denies difficulty breathing,  shortness of breath, cough or  sputum production.   Cardiovascular: Denies chest pain, chest tightness, palpitations or swelling in the hands or feet.  Skin: Pt reports rash in right groin. Denies lesions or ulcercations.   No other specific complaints in a complete review of systems (except as listed in HPI above).  Objective:   Physical Exam   BP 118/78 mmHg  Pulse 99  Temp(Src) 98.2 F (36.8 C) (Oral)  Wt 226 lb (102.513 kg)  SpO2 99% Wt Readings from Last 3 Encounters:  11/14/14 226 lb (102.513 kg)  10/31/14 217 lb 13 oz (98.799 kg)  08/27/14 218 lb (98.884 kg)    General: Appears her stated age, obese in NAD. Skin: Open, erythematous, moist rash noted in right groin. Appears to be yeast. Cardiovascular: Normal rate and rhythm. S1,S2 noted.   Pulmonary/Chest: Normal effort and positive vesicular breath sounds. No respiratory distress. No wheezes, rales or ronchi noted.   BMET    Component Value Date/Time   NA 141 08/14/2014 1024   NA 138 05/03/2014 0746   K 4.1 08/14/2014 1024   K 3.9 05/03/2014 0746   CL 104 08/14/2014 1024   CL 103 05/03/2014 0746   CO2 33* 08/14/2014 1024   CO2 28 05/03/2014 0746   GLUCOSE 150* 08/14/2014 1024   GLUCOSE 122* 05/03/2014 0746   BUN 18 08/14/2014 1024   BUN 11 05/03/2014 0746   CREATININE 0.73 09/29/2014 1019   CREATININE 0.77 08/14/2014 1024   CALCIUM 9.3 08/14/2014 1024   CALCIUM 9.0 05/03/2014 0746   GFRNONAA >60 09/29/2014 1019   GFRNONAA 89* 05/09/2011 1158   GFRAA >60 09/29/2014 1019   GFRAA >90 05/09/2011 1158    Lipid Panel     Component Value Date/Time   CHOL 210* 08/14/2014 1024   TRIG 116.0 08/14/2014 1024   HDL 88.50 08/14/2014 1024   CHOLHDL 2 08/14/2014 1024   VLDL 23.2 08/14/2014 1024   LDLCALC 98 08/14/2014 1024    CBC    Component Value Date/Time   WBC 5.5 10/31/2014 1302   WBC 4.7 09/29/2014 0920   RBC 3.91 10/31/2014 1302   RBC 3.74* 09/29/2014 0920   HGB 12.0 10/31/2014 1302   HGB 11.7*  09/29/2014 0920   HCT 36.3 10/31/2014 1302   HCT 34.7* 09/29/2014 0920   PLT 119* 10/31/2014 1302   PLT 102* 09/29/2014 0920   MCV 92.8 10/31/2014 1302   MCV 93 09/29/2014 0920   MCH 30.7 10/31/2014 1302   MCH 31.2 09/29/2014 0920   MCHC 33.1 10/31/2014 1302   MCHC 33.6 09/29/2014 0920   RDW 14.2 10/31/2014 1302   RDW 14.4 09/29/2014 0920   LYMPHSABS 0.9* 09/29/2014 0920   LYMPHSABS 1.0 08/14/2014 1024   MONOABS 0.4 09/29/2014 0920   MONOABS 0.4 08/14/2014 1024   EOSABS 0.2 09/29/2014 0920   EOSABS 0.1 08/14/2014 1024   BASOSABS 0.1 09/29/2014 0920   BASOSABS 0.0 08/14/2014 1024    Hgb A1C Lab Results  Component Value Date   HGBA1C 6.2 08/14/2014        Assessment & Plan:   Candida Intertrigo:  Advised her to keep the area as dry as possible Avoid rubbing the area, pat dry with a towel after bathing Can dry the area thoroughly using a hair dry eRx for Diflucan 100 mg daily x 14 days  RTC as needed or if symptoms persist or worsen

## 2014-11-14 NOTE — Progress Notes (Signed)
Pre visit review using our clinic review tool, if applicable. No additional management support is needed unless otherwise documented below in the visit note. 

## 2014-11-14 NOTE — Patient Instructions (Signed)
Intertrigo Intertrigo is a skin condition that occurs in between folds of skin in places on the body that rub together a lot and do not get much ventilation. It is caused by heat, moisture, friction, sweat retention, and lack of air circulation, which produces red, irritated patches and, sometimes, scaling or drainage. People who have diabetes, who are obese, or who have treatment with antibiotics are at increased risk for intertrigo. The most common sites for intertrigo to occur include:  The groin.  The breasts.  The armpits.  Folds of abdominal skin.  Webbed spaces between the fingers or toes. Intertrigo may be aggravated by:  Sweat.  Feces.  Yeast or bacteria that are present near skin folds.  Urine.  Vaginal discharge. HOME CARE INSTRUCTIONS  The following steps can be taken to reduce friction and keep the affected area cool and dry:  Expose skin folds to the air.  Keep deep skin folds separated with cotton or linen cloth. Avoid tight fitting clothing that could cause chafing.  Wear open-toed shoes or sandals to help reduce moisture between the toes.  Apply absorbent powders to affected areas as directed by your caregiver.  Apply over-the-counter barrier pastes, such as zinc oxide, as directed by your caregiver.  If you develop a fungal infection in the affected area, your caregiver may have you use antifungal creams. SEEK MEDICAL CARE IF:   The rash is not improving after 1 week of treatment.  The rash is getting worse (more red, more swollen, more painful, or spreading).  You have a fever or chills. MAKE SURE YOU:   Understand these instructions.  Will watch your condition.  Will get help right away if you are not doing well or get worse. Document Released: 05/23/2005 Document Revised: 08/15/2011 Document Reviewed: 11/05/2009 [facility] Patient Information 2015 [place], [place]. This information is not intended to replace advice given to you by your health  care provider. Make sure you discuss any questions you have with your health care provider.

## 2014-11-18 ENCOUNTER — Ambulatory Visit (INDEPENDENT_AMBULATORY_CARE_PROVIDER_SITE_OTHER): Payer: Commercial Managed Care - HMO | Admitting: Pulmonary Disease

## 2014-11-18 ENCOUNTER — Encounter: Payer: Self-pay | Admitting: Pulmonary Disease

## 2014-11-18 VITALS — BP 142/90 | HR 86 | Ht 63.0 in | Wt 209.2 lb

## 2014-11-18 DIAGNOSIS — J45909 Unspecified asthma, uncomplicated: Secondary | ICD-10-CM | POA: Diagnosis not present

## 2014-11-18 DIAGNOSIS — R0600 Dyspnea, unspecified: Secondary | ICD-10-CM | POA: Diagnosis not present

## 2014-11-18 DIAGNOSIS — G4733 Obstructive sleep apnea (adult) (pediatric): Secondary | ICD-10-CM

## 2014-11-18 NOTE — Patient Instructions (Signed)
Follow up in 1 year.

## 2014-11-18 NOTE — Progress Notes (Signed)
Chief Complaint  Patient presents with  . Follow-up    Pt unable to do normal day-to-day house chores without SOB. Using Albuterol HFA frequently. Has not used CPAP machine x 4 months - started fighting it at night and pulling mask off.     History of Present Illness: Stephanie [name] is a 64 y.o. female former smoker with OSA, dyspnea with asthma and deconditioning.  She noticed trouble with her legs after stopping ferrous sulfate was stopped last fall.  She had CT chest in January 2016 >> no change in lung nodule over 3 yrs.  Ferritin from 09/29/14 was 75.  She is no longer on iron supplements.  She c/o feeling short of breath with activity.  She can rest for few minutes and then she feels okay.  As a result she does not do much.  She tries using her inhaler, but this doesn't make much difference.  She will get wheeze in her throat, but not in her chest.  She denies cough, sputum.  She has been using CPAP at night.  She will wake up during the night and realize that mask has come off.  She will put mask back on.  She otherwise feels that CPAP helps her sleep.    TESTS: PSG 08/13/13 >> REM AHI 27.9, SaO2 low 82%, Spent 15.1 min with SpO2 < 90%. CPAP 09/06/13 >> CPAP 8 >> AHI 0, + R, + S. PSG 10/18/13 >> AHI 6.9, SaO2 low 82%. Spent 139.2 min with SaO2 < 89%. ONO with CPAP and RA 11/11/13 >> Test time 8 hrs 59 min. Mean SpO2 92%, low SpO2 81%. Spent 9 min with SpO2 < 88% Echo 11/14/13 >> EF 55 to 60% Ferritin 12/13/13 >> 55.9; started ferrous sulfate PFT 02/07/14 >> FEV1 1.94 (66%), FEV1% 91, TLC 3.62 (77%), DLCO 75%, +BD CPAP 01/06/14 to 02/04/14 >> used on 30 of 30 nights with average 7 hrs and 1 min.  Average AHI is 1.7 with median CPAP 8 cm H2O.  PMHx >> Heb B, HLD, Bipolar, Anxiety, Depression, CVA, GERD, Fatty liver, Type 1 aortic dissection, DM, thrombocytopenia  PSHx, Medications, Allergies, Fhx, Shx reviewed.  Physical Exam: Blood pressure 142/90, pulse 86, height 5' 3"  (1.6 m),  weight 209 lb 3.2 oz (94.892 kg), SpO2 95 %. Body mass index is 37.07 kg/(m^2).  General - No distress ENT - No sinus tenderness, no oral exudate, no LAN Cardiac - s1s2 regular, no murmur Chest - No wheeze/rales/dullness Back - No focal tenderness Abd - Soft, non-tender Ext - No edema Neuro - Normal strength Skin - No rashes Psych - normal mood, and behavior   Assessment/Plan:  Dyspnea. Most likely related to deconditioning.  Her symptom description is not typical of asthma. Plan: - advised her to start a gradual exercise program  Asthma. Plan: - continue symbicort >> discussed proper role of her inhalers  Obstructive sleep apnea. Plan: - continue CPAP 8 cm H2O  Restless leg syndrome. Plan: - requip per PCP  Lung nodule. Plan: - no additional follow up needed based on CT chest from January 2016   Chesley Mires, MD [facility] Pager:  [phone]

## 2014-11-19 ENCOUNTER — Other Ambulatory Visit: Payer: Self-pay | Admitting: Pulmonary Disease

## 2014-11-20 ENCOUNTER — Other Ambulatory Visit: Payer: Self-pay | Admitting: Family Medicine

## 2014-12-10 IMAGING — CR DG CHEST 2V
2 series · 2 of 2 positions shown · non-contrast
Comparison: CT scan of the chest July 02, 2013.

CLINICAL DATA: Left rib pain without reported injury

EXAM:
CHEST  2 VIEW

[view not recorded (1 of 2)]
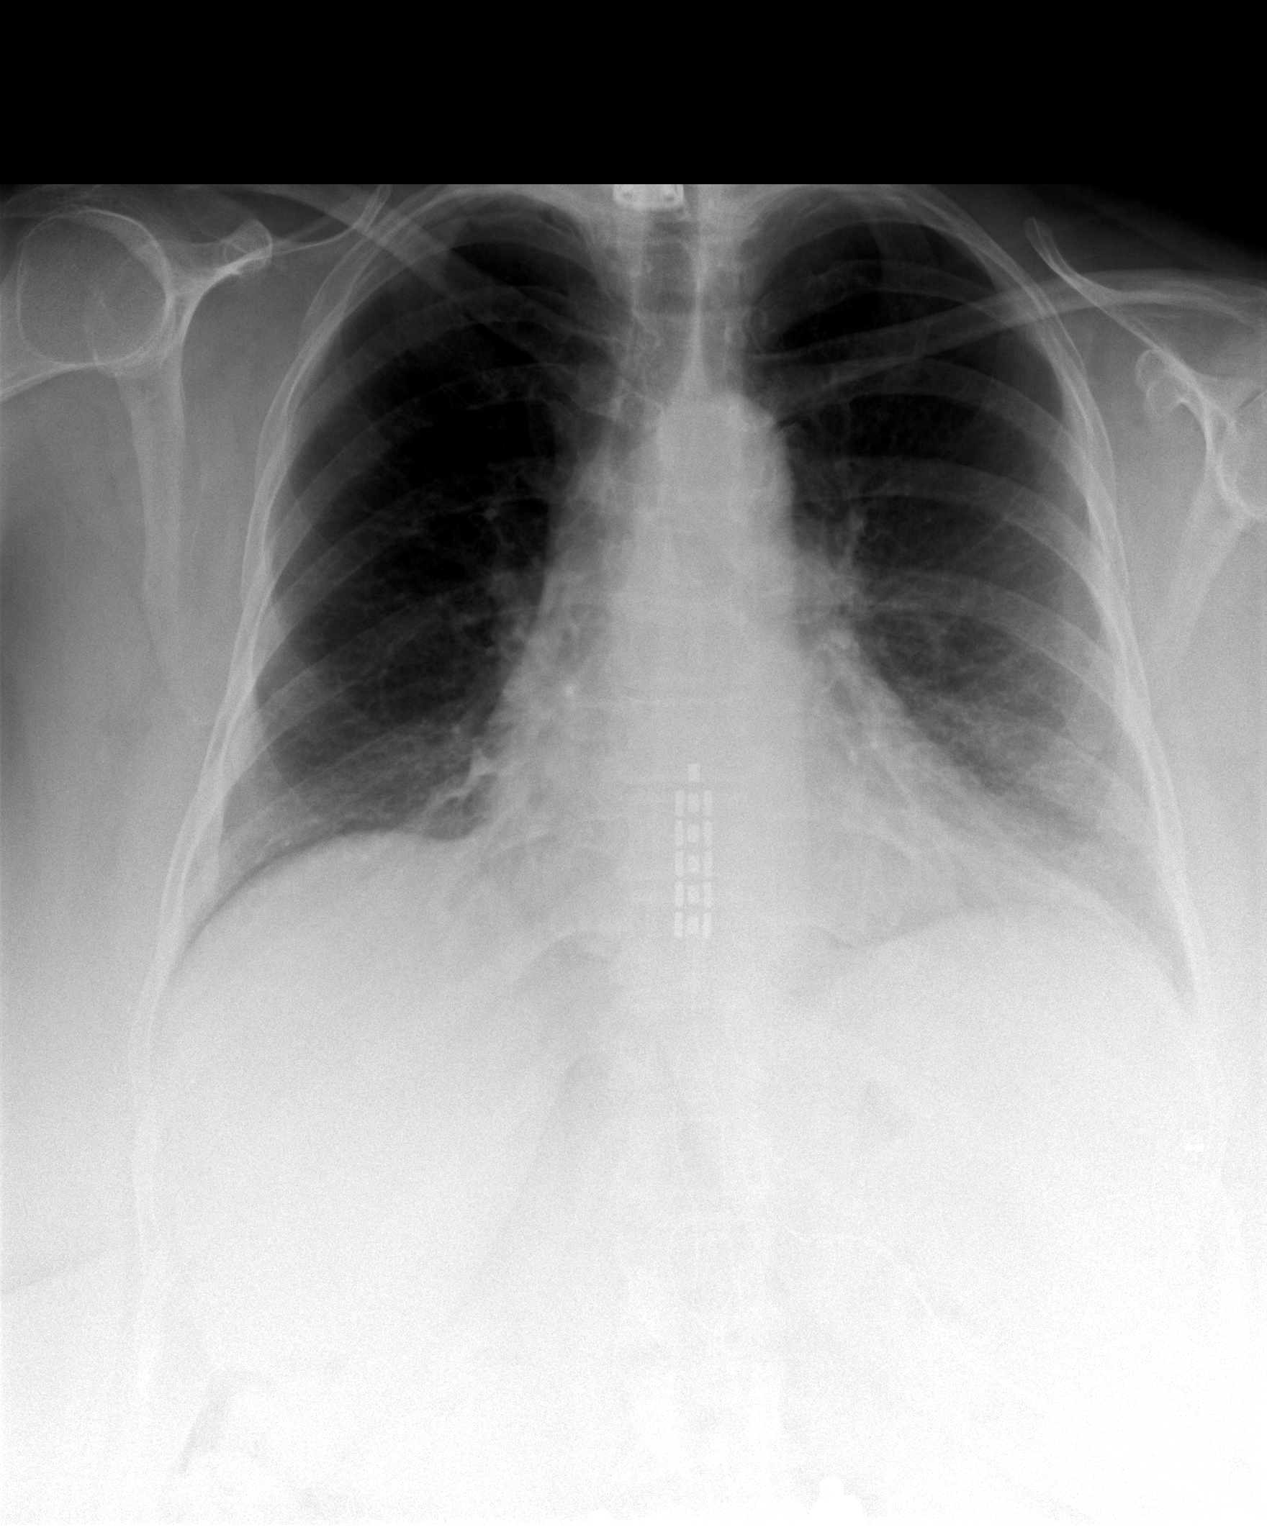

[view not recorded (2 of 2)]
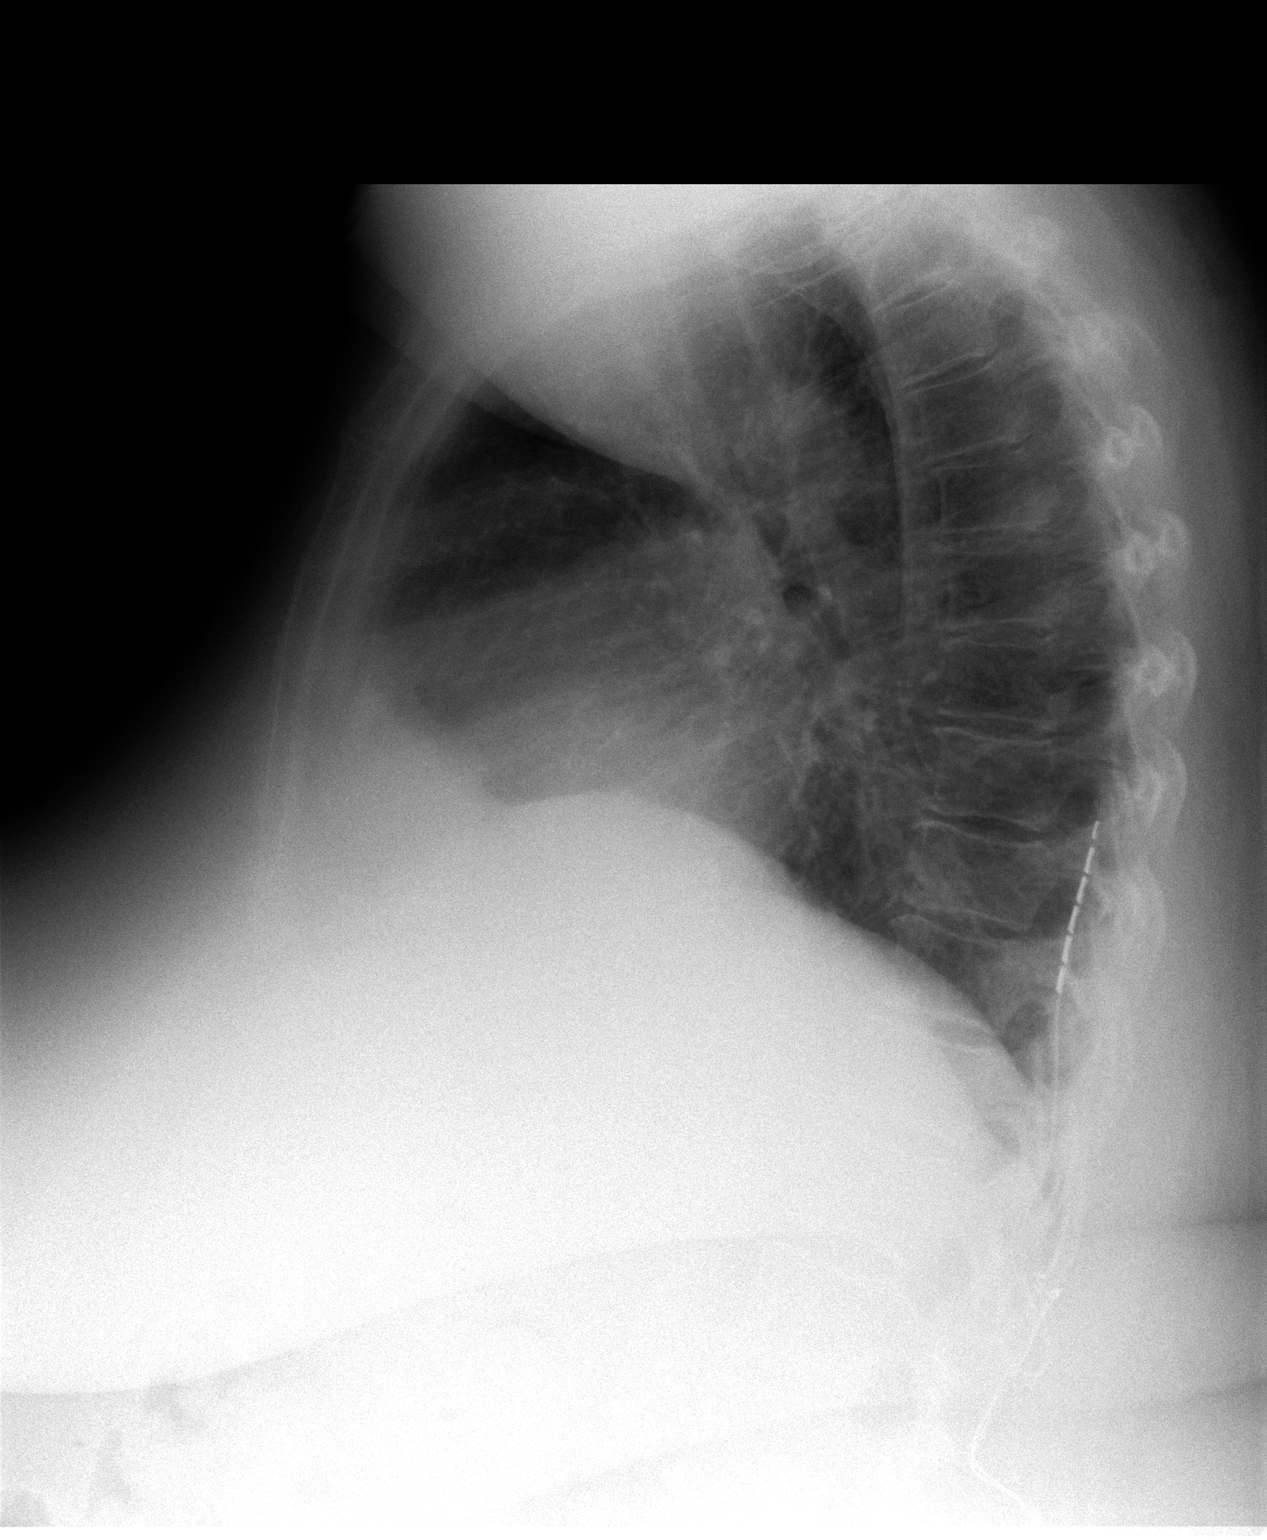

[2 of 2 positions shown; findings below may reference images not displayed]

FINDINGS: The lungs are adequately inflated. There is no focal infiltrate. The
heart is top-normal in size. The pulmonary vascularity is not
engorged. There is no pleural effusion or pneumothorax. The patient
has undergone lower anterior cervical fusion in the past. No acute
rib abnormality is demonstrated. There are neurostimulator
electrodes overlying the lower thoracic spinal canal.
IMPRESSION: No active cardiopulmonary disease.

## 2014-12-11 ENCOUNTER — Other Ambulatory Visit: Payer: Self-pay | Admitting: Family Medicine

## 2014-12-11 NOTE — Telephone Encounter (Signed)
Pt has not had any recent f/u. pls advise

## 2014-12-12 ENCOUNTER — Other Ambulatory Visit: Payer: Self-pay | Admitting: Family Medicine

## 2014-12-15 ENCOUNTER — Telehealth: Payer: Self-pay

## 2014-12-15 ENCOUNTER — Encounter: Payer: Self-pay | Admitting: Emergency Medicine

## 2014-12-15 ENCOUNTER — Ambulatory Visit: Payer: Commercial Managed Care - HMO | Admitting: Family Medicine

## 2014-12-15 ENCOUNTER — Other Ambulatory Visit: Payer: Self-pay | Admitting: Family Medicine

## 2014-12-15 ENCOUNTER — Emergency Department
Admission: EM | Admit: 2014-12-15 | Discharge: 2014-12-15 | Disposition: A | Discharge status: Home / Self Care | Payer: Commercial Managed Care - HMO | Attending: Emergency Medicine | Admitting: Emergency Medicine

## 2014-12-15 ENCOUNTER — Encounter: Payer: Self-pay | Admitting: Family Medicine

## 2014-12-15 ENCOUNTER — Ambulatory Visit (INDEPENDENT_AMBULATORY_CARE_PROVIDER_SITE_OTHER): Payer: Commercial Managed Care - HMO | Admitting: Family Medicine

## 2014-12-15 VITALS — BP 140/74 | HR 92 | Temp 97.8°F | Ht 63.0 in | Wt 222.2 lb

## 2014-12-15 DIAGNOSIS — F329 Major depressive disorder, single episode, unspecified: Secondary | ICD-10-CM | POA: Diagnosis not present

## 2014-12-15 DIAGNOSIS — B379 Candidiasis, unspecified: Secondary | ICD-10-CM | POA: Diagnosis not present

## 2014-12-15 DIAGNOSIS — E785 Hyperlipidemia, unspecified: Secondary | ICD-10-CM | POA: Diagnosis not present

## 2014-12-15 DIAGNOSIS — E1159 Type 2 diabetes mellitus with other circulatory complications: Secondary | ICD-10-CM

## 2014-12-15 DIAGNOSIS — D649 Anemia, unspecified: Secondary | ICD-10-CM | POA: Diagnosis not present

## 2014-12-15 DIAGNOSIS — Z87891 Personal history of nicotine dependence: Secondary | ICD-10-CM | POA: Diagnosis not present

## 2014-12-15 DIAGNOSIS — L304 Erythema intertrigo: Secondary | ICD-10-CM | POA: Insufficient documentation

## 2014-12-15 DIAGNOSIS — B372 Candidiasis of skin and nail: Secondary | ICD-10-CM | POA: Diagnosis not present

## 2014-12-15 DIAGNOSIS — L089 Local infection of the skin and subcutaneous tissue, unspecified: Secondary | ICD-10-CM | POA: Diagnosis present

## 2014-12-15 DIAGNOSIS — I10 Essential (primary) hypertension: Secondary | ICD-10-CM | POA: Diagnosis not present

## 2014-12-15 DIAGNOSIS — Z79899 Other long term (current) drug therapy: Secondary | ICD-10-CM | POA: Insufficient documentation

## 2014-12-15 DIAGNOSIS — Z7982 Long term (current) use of aspirin: Secondary | ICD-10-CM | POA: Diagnosis not present

## 2014-12-15 DIAGNOSIS — D696 Thrombocytopenia, unspecified: Secondary | ICD-10-CM

## 2014-12-15 DIAGNOSIS — K7469 Other cirrhosis of liver: Secondary | ICD-10-CM | POA: Diagnosis not present

## 2014-12-15 DIAGNOSIS — E119 Type 2 diabetes mellitus without complications: Secondary | ICD-10-CM | POA: Diagnosis not present

## 2014-12-15 DIAGNOSIS — R21 Rash and other nonspecific skin eruption: Secondary | ICD-10-CM

## 2014-12-15 LAB — COMPREHENSIVE METABOLIC PANEL
ALBUMIN: 3.6 g/dL (ref 3.5–5.2)
ALK PHOS: 134 U/L — AB (ref 39–117)
ALT: 16 U/L (ref 0–35)
AST: 30 U/L (ref 0–37)
BILIRUBIN TOTAL: 0.5 mg/dL (ref 0.2–1.2)
BUN: 16 mg/dL (ref 6–23)
CO2: 26 meq/L (ref 19–32)
Calcium: 9.1 mg/dL (ref 8.4–10.5)
Chloride: 104 mEq/L (ref 96–112)
Creatinine, Ser: 0.83 mg/dL (ref 0.40–1.20)
GFR: 73.49 mL/min (ref >60.00)
Glucose, Bld: 151 mg/dL — ABNORMAL HIGH (ref 70–99)
Potassium: 4 mEq/L (ref 3.5–5.1)
Sodium: 138 mEq/L (ref 135–145)
Total Protein: 7 g/dL (ref 6.0–8.3)

## 2014-12-15 LAB — TSH: TSH: 4.24 u[IU]/mL (ref 0.35–4.50)

## 2014-12-15 LAB — CBC WITH DIFFERENTIAL/PLATELET
BASOS ABS: 0 10*3/uL (ref 0.0–0.1)
Basophils Relative: 0.3 % (ref 0.0–3.0)
Eosinophils Absolute: 0 10*3/uL (ref 0.0–0.7)
Eosinophils Relative: 0.8 % (ref 0.0–5.0)
HEMATOCRIT: 34.7 % — AB (ref 36.0–46.0)
Hemoglobin: 11.6 g/dL — ABNORMAL LOW (ref 12.0–15.0)
LYMPHS PCT: 16.6 % (ref 12.0–46.0)
Lymphs Abs: 0.8 10*3/uL (ref 0.7–4.0)
MCHC: 33.4 g/dL (ref 30.0–36.0)
MCV: 91 fl (ref 78.0–100.0)
MONO ABS: 0.4 10*3/uL (ref 0.1–1.0)
MONOS PCT: 8.7 % (ref 3.0–12.0)
Neutro Abs: 3.6 10*3/uL (ref 1.4–7.7)
Neutrophils Relative %: 73.6 % (ref 43.0–77.0)
Platelets: 108 10*3/uL — ABNORMAL LOW (ref 150.0–400.0)
RBC: 3.81 Mil/uL — AB (ref 3.87–5.11)
RDW: 14.2 % (ref 11.5–15.5)
WBC: 4.9 10*3/uL (ref 4.0–10.5)

## 2014-12-15 LAB — LIPID PANEL
Cholesterol: 153 mg/dL (ref 0–200)
HDL: 73.5 mg/dL (ref >39.00)
LDL Cholesterol: 59 mg/dL (ref 0–99)
NonHDL: 79.5
Total CHOL/HDL Ratio: 2
Triglycerides: 101 mg/dL (ref 0.0–149.0)
VLDL: 20.2 mg/dL (ref 0.0–40.0)

## 2014-12-15 LAB — VITAMIN B12: Vitamin B-12: 398 pg/mL (ref 211–911)

## 2014-12-15 LAB — HEMOGLOBIN A1C: Hgb A1c MFr Bld: 6.1 % (ref 4.6–6.5)

## 2014-12-15 MED ORDER — SULFAMETHOXAZOLE-TRIMETHOPRIM 800-160 MG PO TABS: 1.0000 | ORAL_TABLET | Freq: Two times a day (BID) | ORAL | Status: DC

## 2014-12-15 MED ORDER — TRAMADOL HCL 50 MG PO TABS: ORAL_TABLET | ORAL | Status: DC

## 2014-12-15 MED ORDER — SULFAMETHOXAZOLE-TRIMETHOPRIM 800-160 MG PO TABS
1.0000 | ORAL_TABLET | Freq: Once | ORAL | Status: AC
  Administered 2014-12-15: 1 via ORAL
  Filled 2014-12-15: qty 1

## 2014-12-15 MED ORDER — DEXAMETHASONE SODIUM PHOSPHATE 10 MG/ML IJ SOLN
10.0000 mg | Freq: Once | INTRAMUSCULAR | Status: AC
  Administered 2014-12-15: 10 mg via INTRAMUSCULAR

## 2014-12-15 MED ORDER — FLUCONAZOLE 100 MG PO TABS: 100.0000 mg | ORAL_TABLET | Freq: Every day | ORAL | Status: DC

## 2014-12-15 MED ORDER — OXYCODONE-ACETAMINOPHEN 7.5-325 MG PO TABS: 1.0000 | ORAL_TABLET | Freq: Four times a day (QID) | ORAL | Status: DC | PRN

## 2014-12-15 MED ORDER — FLUCONAZOLE 150 MG PO TABS: ORAL_TABLET | ORAL | Status: DC

## 2014-12-15 MED ORDER — OXYCODONE-ACETAMINOPHEN 5-325 MG PO TABS
1.0000 | ORAL_TABLET | Freq: Once | ORAL | Status: AC
  Administered 2014-12-15: 1 via ORAL
  Filled 2014-12-15: qty 1

## 2014-12-15 MED ORDER — ZOLPIDEM TARTRATE 5 MG PO TABS: 5.0000 mg | ORAL_TABLET | Freq: Every evening | ORAL | Status: DC | PRN

## 2014-12-15 MED ORDER — NYSTATIN-TRIAMCINOLONE 100000-0.1 UNIT/GM-% EX OINT: 1.0000 "application " | TOPICAL_OINTMENT | Freq: Two times a day (BID) | CUTANEOUS | Status: DC

## 2014-12-15 NOTE — Telephone Encounter (Signed)
I am seeing her right now.

## 2014-12-15 NOTE — Telephone Encounter (Signed)
Both prescriptions called in as directed.

## 2014-12-15 NOTE — ED Notes (Signed)
States she was dx'd with yeast infection to groin area

## 2014-12-15 NOTE — Telephone Encounter (Signed)
Pt wants to know if she should take the fluconazole 100 mg one tab today and then 1 tab in 2 days Since pt got injection today pt wants to verify that she still needs to take the fluconazole. (med list has take fluconazole; one tab daily # 14) please clarify. Pt request cb.

## 2014-12-15 NOTE — Progress Notes (Signed)
Pre visit review using our clinic review tool, if applicable. No additional management support is needed unless otherwise documented below in the visit note. 

## 2014-12-15 NOTE — Telephone Encounter (Signed)
Stephanie [name] had been to [facility] and they did not get the ointment when pt was seen earlier today. Spoke with Vicente Males at [facility] and called in nystatin triamcinolone ointment. Pt voiced understanding and will ck with pharmacy.

## 2014-12-15 NOTE — Patient Instructions (Signed)
Good to see you. Please come back to see me next week if your symptoms are not getting better.

## 2014-12-15 NOTE — Discharge Instructions (Signed)
Continue previous medications and take Percocet as needed for pain.

## 2014-12-15 NOTE — Telephone Encounter (Signed)
Last office visit 12/15/14. Clonazepam last refilled 10/08/14 #60. Diazepam last refilled 10/08/14 #60, Is it okay to refill these two medications?

## 2014-12-15 NOTE — Addendum Note (Signed)
Addended by: Lucille Passy on: 12/15/2014 10:22 AM   Modules accepted: Orders

## 2014-12-15 NOTE — Telephone Encounter (Signed)
Yes take fluconazole but I sent in a new dosage to her pharmacy.  They should be calling her.

## 2014-12-15 NOTE — Assessment & Plan Note (Addendum)
Deteriorated and now quite severe. Given IM decadron in office to help with inflammation. Oral diflucan x 14 days- eRx sent. Topical mycolog.  Keep area dry and follow up next week, sooner if symptoms are worsening as I am concerned she could develop cellulitis. The patient indicates understanding of these issues and agrees with the plan.

## 2014-12-15 NOTE — Telephone Encounter (Signed)
Called Stephanie [name] notifying her of the new dosage of Fluconazole to her pharmacy. Patient verbalized understanding.

## 2014-12-15 NOTE — Progress Notes (Signed)
Subjective:   Patient ID: Stephanie [name], female    DOB: Apr 22, 1951, 64 y.o.   MRN: [id number]  Stephanie [name] is a pleasant 64 y.o. year old female who presents to clinic today with Foot Pain and Rash  on 12/15/2014  HPI:  "my rash is back and it's worse."  Saw [facility] for groin rash on 6/10- note reviewed.   Diagnosed with right sided intertrigo and given oral rx of diflucan x 14 days.  Symptoms resolved but never went away fully.  Two days, rash returned but now much worse.  Very painful, wet and warm.  She is tearful because of how sensitive the area is.  No recent antibiotic use. Blood sugars have been a little higher but only 150s not higher.  And she feels the rash worsened before her blood sugars increased. Lab Results  Component Value Date   HGBA1C 6.2 08/14/2014   Being followed by hematology for thrombocytopenia. Lab Results  Component Value Date   WBC 5.5 10/31/2014   HGB 12.0 10/31/2014   HCT 36.3 10/31/2014   MCV 92.8 10/31/2014   PLT 119* 10/31/2014     Current Outpatient Prescriptions on File Prior to Visit  Medication Sig Dispense Refill  . ABILIFY 30 MG tablet TAKE 1 TABLET BY MOUTH EVERY MORNING 30 tablet 0  . ammonium lactate (AMLACTIN) 12 % cream     . Blood Glucose Monitoring Suppl (ACCU-CHEK AVIVA) device Use as instructed to test blood sugar once daily Dx E11.8 (Patient taking differently: Use as instructed to test blood sugar twice daily) 1 each 0  . clonazePAM (KLONOPIN) 0.5 MG tablet TAKE 1 TABLET BY MOUTH TWICE A DAY 60 tablet 0  . diazepam (VALIUM) 5 MG tablet TAKE 1 TABLET BY MOUTH TWICE A DAY 60 tablet 0  . docusate sodium (COLACE) 100 MG capsule TAKE 1 CAPSULE BY MOUTH 2 TIMES DAILY AS NEEDED FOR CONSTIPATION 60 capsule 3  . glucose blood (ACCU-CHEK AVIVA) test strip Use as instructed to test blood sugar once daily Dx E11.8 100 each 3  . hyoscyamine (LEVBID) 0.375 MG 12 hr tablet Take 1 tablet (0.375 mg total) by mouth 2 (two) times  daily. 60 tablet 0  . lamoTRIgine (LAMICTAL) 100 MG tablet Take 3 tablets by mouth once daily    . Lancets (ACCU-CHEK SOFT TOUCH) lancets Use as instructed to test blood sugar once daily Dx E11.8 100 each 3  . meloxicam (MOBIC) 7.5 MG tablet Take 1 tablet (7.5 mg total) by mouth daily. 30 tablet 0  . metFORMIN (GLUCOPHAGE) 1000 MG tablet Take 0.5 tablets (500 mg total) by mouth 2 (two) times daily with a meal. 180 tablet 0  . methscopolamine (PAMINE) 2.5 MG TABS tablet Take 1 tablet (2.5 mg total) by mouth 4 (four) times daily -  before meals and at bedtime. 120 tablet 3  . mirtazapine (REMERON) 15 MG tablet TAKE 1 TABLET BY MOUTH AT BEDTIME 34 tablet 2  . omeprazole (PRILOSEC) 40 MG capsule TAKE ONE CAPSULE BY MOUTH EVERY DAY 90 capsule 1  . promethazine (PHENERGAN) 25 MG tablet TAKE 1/2 TO 1 TABLET BY MOUTH EVERY 8 HOURS AS NEEDED FOR NAUSEA 30 tablet 2  . rOPINIRole (REQUIP) 3 MG tablet TAKE 1 TABLET BY MOUTH AT BEDTIME 90 tablet 0  . salicylic acid 6 % gel APPLY TO AFFECTED AREA EVERY DAY *USE AT NIGHT AND RINSE OFF IN THE MORNING* 40 g 0  . simvastatin (ZOCOR) 10 MG tablet TAKE  1 TABLET BY MOUTH AT BEDTIME 90 tablet 1  . Spacer/Aero-Holding Chambers (AEROCHAMBER Z-STAT PLUS) inhaler Use as instructed 1 each 0  . SYMBICORT 160-4.5 MCG/ACT inhaler INHALE 2 PUFFS INTO THE LUNGS 2 (TWO) TIMES DAILY. 10.2 Inhaler 0  . urea (CARMOL) 40 % CREA Apply nightly to feet    . nystatin (MYCOSTATIN/NYSTOP) 100000 UNIT/GM POWD Apply to affected area 4 times a day (Patient not taking: Reported on 12/15/2014) 60 g 1  . nystatin-triamcinolone ointment (MYCOLOG) APPLY 1 APPLICATION TOPICALLY 2 (TWO) TIMES DAILY. (Patient not taking: Reported on 12/15/2014) 30 g 0  . tolterodine (DETROL LA) 2 MG 24 hr capsule TAKE ONE CAPSULE BY MOUTH DAILY (Patient not taking: Reported on 12/15/2014) 90 capsule 0  . traMADol (ULTRAM) 50 MG tablet TAKE 2 TABLETS BY MOUTH EVERY MORNING AND 2 TABLETS BY MOUTH EVERY EVENING 120 tablet 0   . zolpidem (AMBIEN) 5 MG tablet Take 1 tablet (5 mg total) by mouth at bedtime as needed. for sleep 30 tablet 0   No current facility-administered medications on file prior to visit.    Allergies  Allergen Reactions  . Doxycycline Other (See Comments)    Face red and swollen; no difficulty breathing    Past Medical History  Diagnosis Date  . Adenomatous colon polyp   . Insomnia   . RLS (restless legs syndrome)   . Colon polyp   . Incontinence   . Hepatitis B, chronic   . H/O: rheumatic fever   . Hyperlipidemia   . Unspecified disorders of nervous system   . Bipolar affective disorder     h/o  . H/O: CVA (cardiovascular accident)     TIA  . Fatty liver 04/09/08    found in abd CT  . Shortness of breath   . Obstructive sleep apnea     not using CPAP, last sleep study 2.5 years ago, per pt doctor is aware  . Blood transfusion 2000  . GERD (gastroesophageal reflux disease)   . Arthritis   . Aortic dissection     Type 1  . Asthma   . Anxiety   . Depression   . Stroke     TIA- 2002  . Ulcer   . Diabetes mellitus without complication   . Anemia   . Blood transfusion without reported diagnosis   . Sleep apnea   . Platelets decreased     Past Surgical History  Procedure Laterality Date  . Back surgery      x 45  . Cholecystectomy    . Rotator cuff repair      left  . Abdominal hysterectomy      total  . Lumbar fusion  10/09  . Hemorroidectomy      and colon polyp removed  . Abdominal exploration surgery    . Posterior cervical fusion/foraminotomy    . Eye surgery      bilat  . Lumbar wound debridement  05/09/2011    Procedure: LUMBAR WOUND DEBRIDEMENT;  Surgeon: Dahlia Bailiff;  Location: [place];  Service: Orthopedics;  Laterality: N/A;  IRRIGATION AND DEBRIDEMENT SPINAL WOUND  . Axillary artery cannulation via 8-mm hemashield graft, median sternotomy, extracorporeal circulation with deep hypothermic circulatory arrest, repair of  aortic dessection   01/23/2009    Dr Arlyce Dice  . Cataract      both eyes  . Colonoscopy    . Liver biopsy  04/10/2012    Procedure: LIVER BIOPSY;  Surgeon: Inda Castle, MD;  Location:  [facility];  Service: Endoscopy;  Laterality: N/A;  ultrasound to mark order for abd limited/liver to be marked to be sent by [email or web address]     Family History  Problem Relation Age of Onset  . Colon cancer Neg Hx   . Esophageal cancer Neg Hx   . Rectal cancer Neg Hx   . Breast cancer Mother   . Lung cancer Mother   . Stomach cancer Father   . Diabetes      4 aunts, and 1 uncle    History   Social History  . Marital Status: Divorced    Spouse Name: N/A  . Number of Children: 1  . Years of Education: N/A   Occupational History  . Disabled    Social History Main Topics  . Smoking status: Former Smoker -- 0.50 packs/day for 50 years    Types: Cigarettes    Quit date: 04/06/2013  . Smokeless tobacco: Never Used  . Alcohol Use: No  . Drug Use: No  . Sexual Activity: Not on file   Other Topics Concern  . Not on file   Social History Narrative   1 son, 73+ y/o   Daily caffeine use: 2 cups daily   Does not get regular exercise   Disability due to bipolar      Has a living will- she is a DNR (she has form at home per pt).   The PMH, PSH, Social History, Family History, Medications, and allergies have been reviewed in [facility], and have been updated if relevant.  Review of Systems  Constitutional: Positive for fatigue.  Respiratory: Negative.   Cardiovascular: Negative.   Gastrointestinal: Negative.   Skin: Positive for rash.  Neurological: Negative.   Psychiatric/Behavioral: The patient is nervous/anxious.   All other systems reviewed and are negative.      Objective:    BP 140/74 mmHg  Pulse 92  Temp(Src) 97.8 F (36.6 C) (Oral)  Ht 5' 3"  (1.6 m)  Wt 222 lb 4 oz (100.812 kg)  BMI 39.38 kg/m2   Physical Exam  Constitutional: She is oriented to person, place, and time. She appears  well-developed and well-nourished.  tearful  HENT:  Head: Normocephalic.  Eyes: Conjunctivae are normal.  Cardiovascular: Normal rate.   Pulmonary/Chest: Effort normal.  Musculoskeletal: She exhibits no edema.  Neurological: She is alert and oriented to person, place, and time. No cranial nerve deficit.  Skin:     Psychiatric: She has a normal mood and affect. Her behavior is normal. Judgment and thought content normal.  Vitals reviewed.         Assessment & Plan:   Thrombocytopenia - Plan: CBC with Differential/Platelet, Comprehensive metabolic panel, Vitamin G95  Yeast infection  Other cirrhosis of liver - Plan: Lipid panel, TSH  Rash and nonspecific skin eruption - Plan: [place] mtn spotted fvr ab, IgG-blood, dexamethasone (DECADRON) injection 10 mg  Intertrigo  Candidal intertrigo - Plan: fluconazole (DIFLUCAN) 100 MG tablet No Follow-up on file.

## 2014-12-15 NOTE — ED Provider Notes (Signed)
[facility] Emergency Department Provider Note  ____________________________________________  Time seen: Approximately 7:09 PM  I have reviewed the triage vital signs and the nursing notes.   HISTORY  Chief Complaint Recurrent Skin Infections    HPI Stephanie [name] is a 64 y.o. female here for a yeast infection to the groin area. Patient was seen by PCP today. Patient family doctor prescribed her Diflucan and nystatin. Patient stated this to continue ongoing problem secondary to her diabetes. Patient states she's does not normally respond to the topical medications. Continue using on the advice of her doctor. Patient stated left groin area is painful and is weeping. Patient state pain has increased since the visit at Dr. and she was not given any pain medication. Patient rates the pain as 8/10 at this time. Patient denies any fever.   Past Medical History  Diagnosis Date  . Adenomatous colon polyp   . Insomnia   . RLS (restless legs syndrome)   . Colon polyp   . Incontinence   . Hepatitis B, chronic   . H/O: rheumatic fever   . Hyperlipidemia   . Unspecified disorders of nervous system   . Bipolar affective disorder     h/o  . H/O: CVA (cardiovascular accident)     TIA  . Fatty liver 04/09/08    found in abd CT  . Shortness of breath   . Obstructive sleep apnea     not using CPAP, last sleep study 2.5 years ago, per pt doctor is aware  . Blood transfusion 2000  . GERD (gastroesophageal reflux disease)   . Arthritis   . Aortic dissection     Type 1  . Asthma   . Anxiety   . Depression   . Stroke     TIA- 2002  . Ulcer   . Diabetes mellitus without complication   . Anemia   . Blood transfusion without reported diagnosis   . Sleep apnea   . Platelets decreased     Patient Active Problem List   Diagnosis Date Noted  . Yeast infection 12/15/2014  . Intertrigo 12/15/2014  . Thrombocytopenia 08/19/2014  . IGTN (ingrowing toe nail)  08/19/2014  . Abdominal pain, chronic, epigastric 06/12/2014  . Elevated blood pressure 03/27/2014  . Liver cirrhosis secondary to NASH 02/27/2014  . Abnormal chest CT 02/21/2014  . Chest wall pain 12/29/2013  . Dizziness and giddiness 10/30/2013  . DOE (dyspnea on exertion) 10/30/2013  . Nausea alone 10/30/2013  . Diabetes mellitus type 2, controlled, with complications 16/12/3708  . DNR (do not resuscitate) 07/29/2013  . Encounter for routine gynecological examination 07/29/2013  . Dyspnea 07/10/2013  . Lung nodule 07/10/2013  . Right foot pain 05/20/2013  . Varicose veins of lower extremities with other complications 62/69/4854  . GERD (gastroesophageal reflux disease) 10/09/2012  . Cirrhosis of liver 02/02/2012  . Anxiety 07/11/2011  . Aortic dissection   . BACK PAIN, LUMBAR, CHRONIC 11/19/2009  . UNS ADVRS EFF OTH RX MEDICINAL&BIOLOGICAL SBSTNC 12/16/2008  . UNSPECIFIED VITAMIN D DEFICIENCY 09/26/2008  . TOBACCO ABUSE 06/19/2008  . RESTLESS LEGS SYNDROME 04/27/2007  . INSOMNIA 04/27/2007  . Benign neoplasm of colon 04/04/2007  . HLD (hyperlipidemia) 03/08/2007  . OSA (obstructive sleep apnea) 03/08/2007  . Essential hypertension 03/08/2007  . Asthma 03/08/2007  . BIPOLAR AFFECTIVE DISORDER, HX OF 03/08/2007    Past Surgical History  Procedure Laterality Date  . Back surgery      x 45  . Cholecystectomy    .  Rotator cuff repair      left  . Abdominal hysterectomy      total  . Lumbar fusion  10/09  . Hemorroidectomy      and colon polyp removed  . Abdominal exploration surgery    . Posterior cervical fusion/foraminotomy    . Eye surgery      bilat  . Lumbar wound debridement  05/09/2011    Procedure: LUMBAR WOUND DEBRIDEMENT;  Surgeon: Dahlia Bailiff;  Location: [place];  Service: Orthopedics;  Laterality: N/A;  IRRIGATION AND DEBRIDEMENT SPINAL WOUND  . Axillary artery cannulation via 8-mm hemashield graft, median sternotomy, extracorporeal circulation with deep  hypothermic circulatory arrest, repair of  aortic dessection  01/23/2009    Dr Arlyce Dice  . Cataract      both eyes  . Colonoscopy    . Liver biopsy  04/10/2012    Procedure: LIVER BIOPSY;  Surgeon: Inda Castle, MD;  Location: [facility];  Service: Endoscopy;  Laterality: N/A;  ultrasound to mark order for abd limited/liver to be marked to be sent by [email or web address]     Current Outpatient Rx  Name  Route  Sig  Dispense  Refill  . ABILIFY 30 MG tablet      TAKE 1 TABLET BY MOUTH EVERY MORNING   30 tablet   0   . ammonium lactate (AMLACTIN) 12 % cream               . Blood Glucose Monitoring Suppl (ACCU-CHEK AVIVA) device      Use as instructed to test blood sugar once daily Dx E11.8 Patient taking differently: Use as instructed to test blood sugar twice daily   1 each   0     U   . clonazePAM (KLONOPIN) 0.5 MG tablet      TAKE 1 TABLET BY MOUTH TWICE A DAY   60 tablet   0     Not to exceed 5 additional fills before 04/06/2015 ...   . diazepam (VALIUM) 5 MG tablet      TAKE 1 TABLET BY MOUTH TWICE A DAY   60 tablet   0     Not to exceed 5 additional fills before 04/06/2015 ...   . docusate sodium (COLACE) 100 MG capsule      TAKE 1 CAPSULE BY MOUTH 2 TIMES DAILY AS NEEDED FOR CONSTIPATION   60 capsule   3   . fluconazole (DIFLUCAN) 100 MG tablet   Oral   Take 1 tablet (100 mg total) by mouth daily.   14 tablet   0   . glucose blood (ACCU-CHEK AVIVA) test strip      Use as instructed to test blood sugar once daily Dx E11.8   100 each   3   . hyoscyamine (LEVBID) 0.375 MG 12 hr tablet   Oral   Take 1 tablet (0.375 mg total) by mouth 2 (two) times daily.   60 tablet   0   . lamoTRIgine (LAMICTAL) 100 MG tablet      Take 3 tablets by mouth once daily         . Lancets (ACCU-CHEK SOFT TOUCH) lancets      Use as instructed to test blood sugar once daily Dx E11.8   100 each   3   . meloxicam (MOBIC) 7.5 MG tablet   Oral   Take 1 tablet (7.5  mg total) by mouth daily.   30 tablet   0   . metFORMIN (GLUCOPHAGE)  1000 MG tablet   Oral   Take 0.5 tablets (500 mg total) by mouth 2 (two) times daily with a meal.   180 tablet   0   . methscopolamine (PAMINE) 2.5 MG TABS tablet   Oral   Take 1 tablet (2.5 mg total) by mouth 4 (four) times daily -  before meals and at bedtime.   120 tablet   3   . mirtazapine (REMERON) 15 MG tablet      TAKE 1 TABLET BY MOUTH AT BEDTIME   34 tablet   2   . nystatin (MYCOSTATIN/NYSTOP) 100000 UNIT/GM POWD      Apply to affected area 4 times a day Patient not taking: Reported on 12/15/2014   60 g   1   . nystatin-triamcinolone ointment (MYCOLOG)      APPLY 1 APPLICATION TOPICALLY 2 (TWO) TIMES DAILY. Patient not taking: Reported on 12/15/2014   30 g   0   . nystatin-triamcinolone ointment (MYCOLOG)   Topical   Apply 1 application topically 2 (two) times daily.   30 g   0   . omeprazole (PRILOSEC) 40 MG capsule      TAKE ONE CAPSULE BY MOUTH EVERY DAY   90 capsule   1   . oxyCODONE-acetaminophen (PERCOCET) 7.5-325 MG per tablet   Oral   Take 1 tablet by mouth every 6 (six) hours as needed for severe pain.   12 tablet   0   . promethazine (PHENERGAN) 25 MG tablet      TAKE 1/2 TO 1 TABLET BY MOUTH EVERY 8 HOURS AS NEEDED FOR NAUSEA   30 tablet   2   . rOPINIRole (REQUIP) 3 MG tablet      TAKE 1 TABLET BY MOUTH AT BEDTIME   90 tablet   0   . salicylic acid 6 % gel      APPLY TO AFFECTED AREA EVERY DAY *USE AT NIGHT AND RINSE OFF IN THE MORNING*   40 g   0   . simvastatin (ZOCOR) 10 MG tablet      TAKE 1 TABLET BY MOUTH AT BEDTIME   90 tablet   1     NEEDS REFILLS   . Spacer/Aero-Holding Chambers (AEROCHAMBER Z-STAT PLUS) inhaler      Use as instructed   1 each   0   . SYMBICORT 160-4.5 MCG/ACT inhaler      INHALE 2 PUFFS INTO THE LUNGS 2 (TWO) TIMES DAILY.   10.2 Inhaler   0     Patient must schedule office visit before future r ...   .  tolterodine (DETROL LA) 2 MG 24 hr capsule      TAKE ONE CAPSULE BY MOUTH DAILY Patient not taking: Reported on 12/15/2014   90 capsule   0   . traMADol (ULTRAM) 50 MG tablet      TAKE 2 TABLETS BY MOUTH EVERY MORNING AND 2 TABLETS BY MOUTH EVERY EVENING   120 tablet   0     Not to exceed 5 additional fills before 05/10/2015 ...   . urea (CARMOL) 40 % CREA      Apply nightly to feet         . zolpidem (AMBIEN) 5 MG tablet   Oral   Take 1 tablet (5 mg total) by mouth at bedtime as needed. for sleep   30 tablet   0     Not to exceed 5 additional fills before 05/04/2015 .Marland KitchenMarland Kitchen  Allergies Doxycycline  Family History  Problem Relation Age of Onset  . Colon cancer Neg Hx   . Esophageal cancer Neg Hx   . Rectal cancer Neg Hx   . Breast cancer Mother   . Lung cancer Mother   . Stomach cancer Father   . Diabetes      4 aunts, and 1 uncle    Social History History  Substance Use Topics  . Smoking status: Former Smoker -- 0.50 packs/day for 50 years    Types: Cigarettes    Quit date: 04/06/2013  . Smokeless tobacco: Never Used  . Alcohol Use: No    Review of Systems Constitutional: No fever/chills Eyes: No visual changes. ENT: No sore throat. Cardiovascular: Denies chest pain. Respiratory: Denies shortness of breath. Gastrointestinal: No abdominal pain.  No nausea, no vomiting.  No diarrhea.  No constipation. Genitourinary: Negative for dysuria. Musculoskeletal: Negative for back pain. Skin: Negative for rash. Neurological: Negative for headaches, focal weakness or numbness. Psychiatric:Depression Endocrine:Diabetes, hypertension and hyperlipidemia. Hematological/Lymphatic:Anemia Allergic/Immunilogical: Doxycycline  10-point ROS otherwise negative.  ____________________________________________   PHYSICAL EXAM:  VITAL SIGNS: ED Triage Vitals  Enc Vitals Group     BP 12/15/14 1841 157/88 mmHg     Pulse Rate 12/15/14 1841 103     Resp 12/15/14  1841 20     Temp 12/15/14 1841 97.9 F (36.6 C)     Temp Source 12/15/14 1841 Oral     SpO2 12/15/14 1841 95 %     Weight 12/15/14 1841 222 lb (100.699 kg)     Height 12/15/14 1841 5' 3"  (1.6 m)     Head Cir --      Peak Flow --      Pain Score 12/15/14 1842 8     Pain Loc --      Pain Edu? --      Excl. in [place]? --     Constitutional: Alert and oriented. Well appearing and in no acute distress. Eyes: Conjunctivae are normal. PERRL. EOMI. Head: Atraumatic. Nose: No congestion/rhinnorhea. Mouth/Throat: Mucous membranes are moist.  Oropharynx non-erythematous. Neck: No stridor.  No cervical spine tenderness to palpation. Hematological/Lymphatic/Immunilogical: No cervical lymphadenopathy. Cardiovascular: Normal rate, regular rhythm. Grossly normal heart sounds.  Good peripheral circulation. Elevated blood pressure Respiratory: Normal respiratory effort.  No retractions. Lungs CTAB. Gastrointestinal: Soft and nontender. No distention. No abdominal bruits. No CVA tenderness. Musculoskeletal: No lower extremity tenderness nor edema.  No joint effusions. Neurologic:  Normal speech and language. No gross focal neurologic deficits are appreciated. Speech is normal. No gait instability. Skin:  Erythematous macular lesions which are weeping the right groin area.  Psychiatric: Mood and affect are normal. Speech and behavior are normal.  ____________________________________________   LABS (all labs ordered are listed, but only abnormal results are displayed)  Labs Reviewed - No data to display ____________________________________________  EKG  ____________________________________________  RADIOLOGY   ____________________________________________   PROCEDURES  Procedure(s) performed: None  Critical Care performed: No  ____________________________________________   INITIAL IMPRESSION / ASSESSMENT AND PLAN / ED COURSE  Pertinent labs & imaging results that were available  during my care of the patient were reviewed by me and considered in my medical decision making (see chart for details).  Cellulitis secondary to yeast infection. Patient advised to continue previous medication prescribed her family doctor today. Patient will start Bactrim as directed. Patient given a three-day supply of Percocet since take as needed for pain. Patient advised to follow-up family doctor in the next 3  days. Advised patient if her condition worsens return back to ER. Also advised the patient is a chronic nature to consider a dermatology consult from a family doctor. ____________________________________________   FINAL CLINICAL IMPRESSION(S) / ED DIAGNOSES  Final diagnoses:  Skin yeast infection      Sable Feil, PA-C 12/15/14 [place], MD 12/15/14 [phone]

## 2014-12-16 ENCOUNTER — Telehealth: Payer: Self-pay | Admitting: Family Medicine

## 2014-12-16 ENCOUNTER — Other Ambulatory Visit: Payer: Self-pay | Admitting: Family Medicine

## 2014-12-16 NOTE — Telephone Encounter (Signed)
Pt called checking on her lab results she had done yesterday

## 2014-12-16 NOTE — Telephone Encounter (Signed)
Spoke to pt and informed her that labs have not yet been results. Advised pt that we automatically contact her, either by phone or letter for ALL results

## 2014-12-16 NOTE — Telephone Encounter (Signed)
Last f/u appt 08/2014-CPE

## 2014-12-16 NOTE — Telephone Encounter (Signed)
Rx called in to requested pharmacy 

## 2014-12-17 LAB — ROCKY MTN SPOTTED FVR AB, IGG-BLOOD: RMSF IGG: 0.07 IV

## 2014-12-18 ENCOUNTER — Telehealth: Payer: Self-pay

## 2014-12-18 DIAGNOSIS — R21 Rash and other nonspecific skin eruption: Secondary | ICD-10-CM

## 2014-12-18 NOTE — Addendum Note (Signed)
Addended by: Lucille Passy on: 12/18/2014 04:31 PM   Modules accepted: Orders

## 2014-12-18 NOTE — Telephone Encounter (Signed)
Pt returned your call [phone]

## 2014-12-18 NOTE — Telephone Encounter (Signed)
Lm on pts vm requesting a call back

## 2014-12-18 NOTE — Telephone Encounter (Signed)
sPoke to pt who confirmed she is still taking anti fungal and using the cream with no relief

## 2014-12-18 NOTE — Telephone Encounter (Signed)
Cell# [phone]

## 2014-12-18 NOTE — Telephone Encounter (Signed)
Is she still taking the anti fungal- fluconazole as well?

## 2014-12-18 NOTE — Telephone Encounter (Signed)
Pt left v/m; pt seen [facility] ED on 12/15/14 for skin yeast infection; pt taking Bactrim DS twice a day and pt cannot see a lot of improvement; pt has appt to see Dr Deborra Medina on 12/22/14. Pt wants cb to verify Dr Deborra Medina wants pt to continue taking abx.Please advise.

## 2014-12-18 NOTE — Telephone Encounter (Signed)
I'm sorry to hear that.  Will refer to dermatology urgently.

## 2014-12-22 ENCOUNTER — Ambulatory Visit: Payer: Commercial Managed Care - HMO | Admitting: Family Medicine

## 2014-12-22 ENCOUNTER — Other Ambulatory Visit: Payer: Self-pay | Admitting: Family Medicine

## 2014-12-30 ENCOUNTER — Ambulatory Visit: Payer: Commercial Managed Care - HMO

## 2015-01-16 ENCOUNTER — Other Ambulatory Visit: Payer: Self-pay | Admitting: Family Medicine

## 2015-01-19 ENCOUNTER — Other Ambulatory Visit: Payer: Self-pay | Admitting: Family Medicine

## 2015-01-19 NOTE — Telephone Encounter (Signed)
Pt left v/m; pt going out of town early morning on 01/20/15 and will not be back home until 01/30/15. Pt request tramadol refilled ASAP. Last refilled # 120 on 12/15/14. Last annual exam 08/19/2014.Please advise.

## 2015-01-19 NOTE — Telephone Encounter (Signed)
Rx called in to requested pharmacy 

## 2015-01-19 NOTE — Telephone Encounter (Signed)
Last f/u appt 08/2014-CPE

## 2015-01-20 ENCOUNTER — Ambulatory Visit (INDEPENDENT_AMBULATORY_CARE_PROVIDER_SITE_OTHER): Payer: Commercial Managed Care - HMO | Admitting: Podiatry

## 2015-01-20 ENCOUNTER — Ambulatory Visit: Payer: Commercial Managed Care - HMO

## 2015-01-20 DIAGNOSIS — M79676 Pain in unspecified toe(s): Secondary | ICD-10-CM | POA: Diagnosis not present

## 2015-01-20 DIAGNOSIS — B351 Tinea unguium: Secondary | ICD-10-CM

## 2015-01-20 DIAGNOSIS — E1140 Type 2 diabetes mellitus with diabetic neuropathy, unspecified: Secondary | ICD-10-CM

## 2015-01-20 DIAGNOSIS — E1149 Type 2 diabetes mellitus with other diabetic neurological complication: Secondary | ICD-10-CM

## 2015-01-20 NOTE — Progress Notes (Signed)
Patient ID: Stephanie [name], female   DOB: 1950/10/22, 64 y.o.   MRN: [id number]  Subjective: 64 y.o. returns the office today for painful, elongated, thickened toenails which she is unable to trim herself. Denies any redness or drainage around the nails. She states that she also contused of burning, pain to the bottoms of her feet. She was going to pain management however she discontinued going. She does not talk about starting gabapentin. Denies any acute changes since last appointment and no new complaints today. Denies any systemic complaints such as fevers, chills, nausea, vomiting.   Objective: AAO 3, NAD DP/PT pulses palpable, CRT less than 3 seconds Protective sensation decreased with Simms Weinstein monofilament, Achilles tendon reflex intact.  Nails hypertrophic, dystrophic, elongated, brittle, discolored 10. There is tenderness overlying the nails 1-5 bilaterally. There is no surrounding erythema or drainage along the nail sites. No open lesions or pre-ulcerative lesions are identified. No other areas of tenderness bilateral lower extremities. No overlying edema, erythema, increased warmth. No pain with calf compression, swelling, warmth, erythema.  Assessment: Patient presents with symptomatic onychomycosis  Plan: -Treatment options including alternatives, risks, complications were discussed -Nails sharply debrided 10 without complication/bleeding. -Due to the burning pain to both of her feet I do believe that she would likely benefit from gabapentin. I will contact her primary care physician regarding to starting this prior. I had her discuss this with pain management however she did not go. -Discussed daily foot inspection. If there are any changes, to call the office immediately.  -Follow-up in 3 months or sooner if any problems are to arise. In the meantime, encouraged to call the office with any questions, concerns, changes symptoms.  Celesta Gentile, DPM

## 2015-02-02 ENCOUNTER — Inpatient Hospital Stay: Payer: Commercial Managed Care - HMO | Attending: Hematology and Oncology | Admitting: Hematology and Oncology

## 2015-02-02 ENCOUNTER — Telehealth: Payer: Self-pay | Admitting: *Deleted

## 2015-02-02 ENCOUNTER — Inpatient Hospital Stay: Payer: Commercial Managed Care - HMO

## 2015-02-02 VITALS — BP 110/74 | HR 98 | Temp 98.8°F | Resp 16 | Wt 216.5 lb

## 2015-02-02 DIAGNOSIS — D696 Thrombocytopenia, unspecified: Secondary | ICD-10-CM | POA: Diagnosis not present

## 2015-02-02 DIAGNOSIS — Z803 Family history of malignant neoplasm of breast: Secondary | ICD-10-CM | POA: Diagnosis not present

## 2015-02-02 DIAGNOSIS — F319 Bipolar disorder, unspecified: Secondary | ICD-10-CM | POA: Diagnosis not present

## 2015-02-02 DIAGNOSIS — D649 Anemia, unspecified: Secondary | ICD-10-CM | POA: Diagnosis not present

## 2015-02-02 DIAGNOSIS — Z8619 Personal history of other infectious and parasitic diseases: Secondary | ICD-10-CM

## 2015-02-02 DIAGNOSIS — G2581 Restless legs syndrome: Secondary | ICD-10-CM

## 2015-02-02 DIAGNOSIS — K760 Fatty (change of) liver, not elsewhere classified: Secondary | ICD-10-CM | POA: Diagnosis not present

## 2015-02-02 DIAGNOSIS — R32 Unspecified urinary incontinence: Secondary | ICD-10-CM

## 2015-02-02 DIAGNOSIS — Z8673 Personal history of transient ischemic attack (TIA), and cerebral infarction without residual deficits: Secondary | ICD-10-CM

## 2015-02-02 DIAGNOSIS — R161 Splenomegaly, not elsewhere classified: Secondary | ICD-10-CM | POA: Diagnosis not present

## 2015-02-02 DIAGNOSIS — Z8669 Personal history of other diseases of the nervous system and sense organs: Secondary | ICD-10-CM | POA: Diagnosis not present

## 2015-02-02 DIAGNOSIS — R5383 Other fatigue: Secondary | ICD-10-CM

## 2015-02-02 DIAGNOSIS — Z801 Family history of malignant neoplasm of trachea, bronchus and lung: Secondary | ICD-10-CM

## 2015-02-02 DIAGNOSIS — Z9181 History of falling: Secondary | ICD-10-CM

## 2015-02-02 DIAGNOSIS — M549 Dorsalgia, unspecified: Secondary | ICD-10-CM

## 2015-02-02 DIAGNOSIS — Z87891 Personal history of nicotine dependence: Secondary | ICD-10-CM

## 2015-02-02 DIAGNOSIS — Z86010 Personal history of colonic polyps: Secondary | ICD-10-CM

## 2015-02-02 DIAGNOSIS — Z800 Family history of malignant neoplasm of digestive organs: Secondary | ICD-10-CM | POA: Diagnosis not present

## 2015-02-02 DIAGNOSIS — K7460 Unspecified cirrhosis of liver: Secondary | ICD-10-CM | POA: Diagnosis not present

## 2015-02-02 DIAGNOSIS — E785 Hyperlipidemia, unspecified: Secondary | ICD-10-CM

## 2015-02-02 DIAGNOSIS — M25559 Pain in unspecified hip: Secondary | ICD-10-CM

## 2015-02-02 LAB — CBC WITH DIFFERENTIAL/PLATELET
BASOS ABS: 0 10*3/uL (ref 0–0.1)
BASOS PCT: 1 %
Eosinophils Absolute: 0.1 10*3/uL (ref 0–0.7)
Eosinophils Relative: 1 %
HEMATOCRIT: 35 % (ref 35.0–47.0)
HEMOGLOBIN: 11.8 g/dL — AB (ref 12.0–16.0)
LYMPHS PCT: 21 %
Lymphs Abs: 1.2 10*3/uL (ref 1.0–3.6)
MCH: 30.4 pg (ref 26.0–34.0)
MCHC: 33.7 g/dL (ref 32.0–36.0)
MCV: 90.2 fL (ref 80.0–100.0)
Monocytes Absolute: 0.5 10*3/uL (ref 0.2–0.9)
Monocytes Relative: 8 %
NEUTROS ABS: 4 10*3/uL (ref 1.4–6.5)
Neutrophils Relative %: 69 %
Platelets: 122 10*3/uL — ABNORMAL LOW (ref 150–440)
RBC: 3.88 MIL/uL (ref 3.80–5.20)
RDW: 14.8 % — ABNORMAL HIGH (ref 11.5–14.5)
WBC: 5.8 10*3/uL (ref 3.6–11.0)

## 2015-02-02 NOTE — Telephone Encounter (Signed)
I have not heard back. Can you send a letter asking if is OK to start gabapentin? Thanks.

## 2015-02-02 NOTE — Progress Notes (Signed)
[facility] day:  02/02/2015  Chief Complaint: Stephanie [name] is a 64 y.o. female with chronic thrombocytopenia and mild anemia who is seen for 3 month assessment.  HPI: The patient was last seen in the medical oncology clinic on 10/31/2014.  At that time, she was seen for initial assessment by me.  She was noted to have mild thrombocytopenia ranging between 110,000 and 130,000 without trend.  Abdominal ultrasound had revealed mild splenomegaly with moderate cirrhosis. Laboratory testing had been negative except for positive ANA and RF. She noted a distant history of active hepatitis B. Additional hepatitis B testing was negative.  The patient states that she has been falling down a lot. She has fallen 3 times (1st of August, 2 weeks ago, and Thursday).  She states that she feels weak.  She denies any presyncope or tachycardia.  She states that her diet is not good.  She eats 1 meal a day.  She does not eat much meat or vegetables. She denies any alcohol use.  Past Medical History  Diagnosis Date  . Adenomatous colon polyp   . Insomnia   . RLS (restless legs syndrome)   . Colon polyp   . Incontinence   . Hepatitis B, chronic   . H/O: rheumatic fever   . Hyperlipidemia   . Unspecified disorders of nervous system   . Bipolar affective disorder     h/o  . H/O: CVA (cardiovascular accident)     TIA  . Fatty liver 04/09/08    found in abd CT  . Shortness of breath   . Obstructive sleep apnea     not using CPAP, last sleep study 2.5 years ago, per pt doctor is aware  . Blood transfusion 2000  . GERD (gastroesophageal reflux disease)   . Arthritis   . Aortic dissection     Type 1  . Asthma   . Anxiety   . Depression   . Stroke     TIA- 2002  . Ulcer   . Diabetes mellitus without complication   . Anemia   . Blood transfusion without reported diagnosis   . Sleep apnea   . Platelets decreased     Past Surgical History  Procedure  Laterality Date  . Back surgery      x 45  . Cholecystectomy    . Rotator cuff repair      left  . Abdominal hysterectomy      total  . Lumbar fusion  10/09  . Hemorroidectomy      and colon polyp removed  . Abdominal exploration surgery    . Posterior cervical fusion/foraminotomy    . Eye surgery      bilat  . Lumbar wound debridement  05/09/2011    Procedure: LUMBAR WOUND DEBRIDEMENT;  Surgeon: Dahlia Bailiff;  Location: [facility];  Service: Orthopedics;  Laterality: N/A;  IRRIGATION AND DEBRIDEMENT SPINAL WOUND  . Axillary artery cannulation via 8-mm hemashield graft, median sternotomy, extracorporeal circulation with deep hypothermic circulatory arrest, repair of  aortic dessection  01/23/2009    Dr Arlyce Dice  . Cataract      both eyes  . Colonoscopy    . Liver biopsy  04/10/2012    Procedure: LIVER BIOPSY;  Surgeon: Inda Castle, MD;  Location: [facility];  Service: Endoscopy;  Laterality: N/A;  ultrasound to mark order for abd limited/liver to be marked to be sent by [email or web address]     Family History  Problem Relation Age of Onset  . Colon cancer Neg Hx   . Esophageal cancer Neg Hx   . Rectal cancer Neg Hx   . Breast cancer Mother   . Lung cancer Mother   . Stomach cancer Father   . Diabetes      4 aunts, and 1 uncle    Social History:  reports that she quit smoking about 21 months ago. Her smoking use included Cigarettes. She has a 25 pack-year smoking history. She has never used smokeless tobacco. She reports that she does not drink alcohol or use illicit drugs.  The patient is alone today.  Allergies:  Allergies  Allergen Reactions  . Doxycycline Other (See Comments)    Face red and swollen; no difficulty breathing    Current Medications: Current Outpatient Prescriptions  Medication Sig Dispense Refill  . ABILIFY 30 MG tablet TAKE 1 TABLET BY MOUTH EVERY MORNING 30 tablet 0  . ammonium lactate (AMLACTIN) 12 % cream     . Blood Glucose Monitoring Suppl  (ACCU-CHEK AVIVA) device Use as instructed to test blood sugar once daily Dx E11.8 (Patient taking differently: Use as instructed to test blood sugar twice daily) 1 each 0  . clonazePAM (KLONOPIN) 0.5 MG tablet TAKE 1 TABLET BY MOUTH TWICE A DAY 60 tablet 0  . diazepam (VALIUM) 5 MG tablet TAKE 1 TABLET BY MOUTH TWICE A DAY 60 tablet 0  . docusate sodium (COLACE) 100 MG capsule TAKE 1 CAPSULE BY MOUTH 2 TIMES DAILY AS NEEDED FOR CONSTIPATION 60 capsule 3  . fluticasone (CUTIVATE) 0.05 % cream APPLY TWICE A DAY TO AFFECTED AREAS, APPLY AFTER 1ST CREAM  2  . glucose blood (ACCU-CHEK AVIVA) test strip Use as instructed to test blood sugar once daily Dx E11.8 100 each 3  . hyoscyamine (LEVBID) 0.375 MG 12 hr tablet Take 1 tablet (0.375 mg total) by mouth 2 (two) times daily. 60 tablet 0  . ketoconazole (NIZORAL) 2 % cream APPLY TWICE A DAY TO AFFECTED AREAS, APPLY TO SKIN FIRST  2  . lamoTRIgine (LAMICTAL) 100 MG tablet Take 3 tablets by mouth once daily    . Lancets (ACCU-CHEK SOFT TOUCH) lancets Use as instructed to test blood sugar once daily Dx E11.8 100 each 3  . meloxicam (MOBIC) 7.5 MG tablet Take 1 tablet (7.5 mg total) by mouth daily. 30 tablet 0  . metFORMIN (GLUCOPHAGE) 1000 MG tablet TAKE 1 TABLET (1,000 MG TOTAL) BY MOUTH 2 (TWO) TIMES DAILY WITH A MEAL. 180 tablet 1  . methscopolamine (PAMINE) 2.5 MG TABS tablet Take 1 tablet (2.5 mg total) by mouth 4 (four) times daily -  before meals and at bedtime. 120 tablet 3  . mirtazapine (REMERON) 15 MG tablet TAKE 1 TABLET BY MOUTH AT BEDTIME 34 tablet 2  . nystatin (MYCOSTATIN/NYSTOP) 100000 UNIT/GM POWD Apply to affected area 4 times a day 60 g 1  . nystatin-triamcinolone ointment (MYCOLOG) Apply 1 application topically 2 (two) times daily. 30 g 0  . omeprazole (PRILOSEC) 40 MG capsule TAKE ONE CAPSULE BY MOUTH EVERY DAY 90 capsule 3  . promethazine (PHENERGAN) 25 MG tablet TAKE 1/2 TO 1 TABLET BY MOUTH EVERY 8 HOURS AS NEEDED FOR NAUSEA 30  tablet 2  . rOPINIRole (REQUIP) 3 MG tablet TAKE 1 TABLET BY MOUTH AT BEDTIME 90 tablet 0  . salicylic acid 6 % gel APPLY TO AFFECTED AREA EVERY DAY *USE AT NIGHT AND RINSE OFF IN THE MORNING* 40 g 0  .  simvastatin (ZOCOR) 10 MG tablet TAKE 1 TABLET BY MOUTH AT BEDTIME 90 tablet 1  . Spacer/Aero-Holding Chambers (AEROCHAMBER Z-STAT PLUS) inhaler Use as instructed 1 each 0  . SYMBICORT 160-4.5 MCG/ACT inhaler INHALE 2 PUFFS INTO THE LUNGS 2 (TWO) TIMES DAILY. 10.2 Inhaler 0  . tolterodine (DETROL LA) 2 MG 24 hr capsule TAKE ONE CAPSULE BY MOUTH DAILY 90 capsule 0  . traMADol (ULTRAM) 50 MG tablet TAKE 2 TABLETS BY MOUTH EVERY MORNING AND 2 TABLETS BY MOUTH EVERY EVENING 120 tablet 0  . urea (CARMOL) 40 % CREA Apply nightly to feet    . zolpidem (AMBIEN) 5 MG tablet TAKE 1 TABLET BY MOUTH AT BEDTIME AS NEEDED FOR SLEEP 30 tablet 0  . fluconazole (DIFLUCAN) 100 MG tablet Take 1 tablet (100 mg total) by mouth daily. (Patient not taking: Reported on 02/02/2015) 14 tablet 0  . nystatin-triamcinolone ointment (MYCOLOG) APPLY 1 APPLICATION TOPICALLY 2 (TWO) TIMES DAILY. (Patient not taking: Reported on 12/15/2014) 30 g 0  . oxyCODONE-acetaminophen (PERCOCET) 7.5-325 MG per tablet Take 1 tablet by mouth every 6 (six) hours as needed for severe pain. (Patient not taking: Reported on 02/02/2015) 12 tablet 0  . sulfamethoxazole-trimethoprim (BACTRIM DS,SEPTRA DS) 800-160 MG per tablet Take 1 tablet by mouth 2 (two) times daily. (Patient not taking: Reported on 02/02/2015) 20 tablet 0   No current facility-administered medications for this visit.    Review of Systems:  GENERAL:  Feels weak.  No fevers, sweats or weight loss. PERFORMANCE STATUS (ECOG):  1 HEENT:  No visual changes, runny nose, sore throat, mouth sores or tenderness. Lungs: No shortness of breath or cough.  No hemoptysis. Cardiac:  No chest pain, palpitations, orthopnea, or PND. GI:  No nausea, vomiting, diarrhea, constipation, melena or  hematochezia. GU:  No urgency, frequency, dysuria, or hematuria. Musculoskeletal:  No back pain.  No joint pain.  No muscle tenderness. Extremities:  No pain or swelling. Skin:  Lots of yeast infections.  No rashes or skin changes. Neuro:  No focal weakness.  No seizures.  No headache, numbness or weakness, balance or coordination issues. Endocrine:  Diabetes.  No thyroid issues, hot flashes or night sweats. Psych:  No mood changes, depression or anxiety. Pain:  No focal pain. Review of systems:  All other systems reviewed and found to be negative.  Physical Exam: Blood pressure 110/74, pulse 98, temperature 98.8 F (37.1 C), temperature source Tympanic, resp. rate 16, weight 216 lb 7.9 oz (98.2 kg). GENERAL:  Well developed, well nourished, sitting comfortably in the exam room in no acute distress. MENTAL STATUS:  Alert and oriented to person, place and time. HEAD:  Short gray hair.  Normocephalic, atraumatic, face symmetric, no Cushingoid features. EYES:  Blue eyes s/p cataract surgery.  Pupils equal round and reactive to light and accomodation.  No conjunctivitis or scleral icterus. ENT:  Oropharynx clear without lesion.  Dentures.  Tongue normal. Mucous membranes moist.  RESPIRATORY:  Clear to auscultation without rales, wheezes or rhonchi. CARDIOVASCULAR:  Regular rate and rhythm without murmur, rub or gallop. ABDOMEN:  Soft, fully round, non-tender, with active bowel sounds, and no appreciable hepatosplenomegaly.  No masses. SKIN:  Abdominal bruises.  No rashes, ulcers or lesions. EXTREMITIES: Tender at multiple trigger points.  No edema, no skin discoloration or tenderness.  No palpable cords. LYMPH NODES: No palpable cervical, supraclavicular, axillary or inguinal adenopathy  NEUROLOGICAL: Unremarkable. PSYCH:  Appropriate.  Appointment on 02/02/2015  Component Date Value Ref Range Status  .  WBC 02/02/2015 5.8  3.6 - 11.0 K/uL Final  . RBC 02/02/2015 3.88  3.80 - 5.20 MIL/uL  Final  . Hemoglobin 02/02/2015 11.8* 12.0 - 16.0 g/dL Final  . HCT 02/02/2015 35.0  35.0 - 47.0 % Final  . MCV 02/02/2015 90.2  80.0 - 100.0 fL Final  . MCH 02/02/2015 30.4  26.0 - 34.0 pg Final  . MCHC 02/02/2015 33.7  32.0 - 36.0 g/dL Final  . RDW 02/02/2015 14.8* 11.5 - 14.5 % Final  . Platelets 02/02/2015 122* 150 - 440 K/uL Final  . Neutrophils Relative % 02/02/2015 69   Final  . Neutro Abs 02/02/2015 4.0  1.4 - 6.5 K/uL Final  . Lymphocytes Relative 02/02/2015 21   Final  . Lymphs Abs 02/02/2015 1.2  1.0 - 3.6 K/uL Final  . Monocytes Relative 02/02/2015 8   Final  . Monocytes Absolute 02/02/2015 0.5  0.2 - 0.9 K/uL Final  . Eosinophils Relative 02/02/2015 1   Final  . Eosinophils Absolute 02/02/2015 0.1  0 - 0.7 K/uL Final  . Basophils Relative 02/02/2015 1   Final  . Basophils Absolute 02/02/2015 0.0  0 - 0.1 K/uL Final    Assessment:  CONNEE IKNER is a 64 y.o. 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 Platelets have ranged between 108,000 and 128,000 without trend. She denies any new medications or herbal products.  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  Work-up on 08/29/2014 revealed a positive rheumatoid factor and ANA (anti-double-stranded DNA of 5- equivocal) Negative studies included hepatitis B surface antibody and antigen, hepatitis C testing, and HIV testing. B12, SPEP, and free light chains were normal.  Additional testing on 10/31/2014 revealed negative hepatitis B core antibody and CMV IgM.  Abdominal ultrasound on 09/15/2014 revealed mild splenomegaly (12.9 cm) with moderate cirrhosis and no focal liver lesions. AFP was normal (4).  She denies any alcohol use.  She has had a mild normocytic anemia. Ferritin was 75 on 09/29/2014. Iron saturation was 6%. Reticulocyte count was 2.42%. Coombs was negative. Last colonoscopy was in 08/2014.  Diet is poor.  Symptomatically, she is  fatigued. She has had several episodes of falling secondary to general weakness. She denies any bleeding.  She has multiple trigger points (? fibromyalgia).  Plan: 1.  Labs today:  CBC with diff. 2.  Discuss follow-up with PCP 3.  RTC in 3 months for MD assessment and labs (CBC with diff, ferritin, iron studies, ESR, B12, folate)   Lequita Asal, MD  02/02/2015, 10:16 AM

## 2015-02-02 NOTE — Telephone Encounter (Addendum)
Pt states she would like to know the status of the conversation Dr. Jacqualyn Posey had with her primary doctor concerning her medication.  Dr. Jacqualyn Posey request letter to be sent to Dr. Deborra Medina requesting authorization to begin Gabapentin with pt's other medications.  Done.  Pt called requesting information concerning her medication. I informed pt that Dr. Jacqualyn Posey had correspondence sent to Dr. Deborra Medina and had not heard from her yet.

## 2015-02-03 ENCOUNTER — Telehealth: Payer: Self-pay | Admitting: Family Medicine

## 2015-02-03 ENCOUNTER — Encounter: Payer: Self-pay | Admitting: *Deleted

## 2015-02-03 NOTE — Telephone Encounter (Signed)
Please get records from cancer center and schedule 30 min appt- ok to use 2 acute slots.

## 2015-02-03 NOTE — Telephone Encounter (Signed)
Pt called regarding wanting to see a specialist  The cancer center center doctor said she needed to see a rheumatoid arthritis doctor.   Pt is in worsening pain from fibromyalgia Is interested in increasing tramadol.   cb number is cell number [phone] Thanks

## 2015-02-04 NOTE — Telephone Encounter (Signed)
Cancer center records in EMR- pt only needing 6mn appt

## 2015-02-05 NOTE — Telephone Encounter (Signed)
Pt left v/m requesting cb about appt with Dr Deborra Medina.

## 2015-02-05 NOTE — Telephone Encounter (Signed)
Scheduled for 9/7. Pt aware

## 2015-02-09 ENCOUNTER — Encounter: Payer: Self-pay | Admitting: Hematology and Oncology

## 2015-02-09 NOTE — Progress Notes (Signed)
[facility] day:  10/31/2014  Chief Complaint: Stephanie [name] is a 64 y.o. female with anemia and thrombocytopenia who is seen for reassessment.  HPI:  The patient was first noted to have thrombocytopenia on 12/26/2013.  Platelet count has ranged between 108,000 and 128,000 without trend.  The patient was first seen by Dr. [name] Pilgrim on 08/29/2014.  At that time workup revealed negative hepatitis  and C serologies as well as HIV testing.  Rheumatoid factor was 19.2 (0-39).  ANA was positive with anti-double-stranded DNA of 5 (equivocal).  B12 was 600 (normal).  Serum protein electrophoresis (SPEP) was normal.  Kappa free light chains were 38.16 with lambda free light chains of 27.44 and a ratio 1.39 (normal).  Abdominal ultrasound on 09/15/2014 revealed mild splenomegaly (12.9 cm) with moderate cirrhosis and no focal liver lesions.  AFP was normal (4).  She has also had a documented mild normocytic anemia. Ferritin was 75 on 09/29/2014.  Iron saturation was 6%. Reticulocyte count was 2.42%. Coombs was negative.  Symptomatically, she has felt bad for the past 2 weeks. She denies any fever or sweats. Weight has been up and down. She has shortness of breath with exertion. Her hip hurts.  She has arthritis. She notes a lot of bacterial infections (UTI) but no skin infections or gingivitis.  She states that she had chronic active hepatitis at age 4 and was hospitalized for 2 months.    Past Medical History  Diagnosis Date  . Adenomatous colon polyp   . Insomnia   . RLS (restless legs syndrome)   . Colon polyp   . Incontinence   . Hepatitis B, chronic   . H/O: rheumatic fever   . Hyperlipidemia   . Unspecified disorders of nervous system   . Bipolar affective disorder     h/o  . H/O: CVA (cardiovascular accident)     TIA  . Fatty liver 04/09/08    found in abd CT  . Shortness of breath   . Obstructive sleep apnea     not using CPAP, last sleep  study 2.5 years ago, per pt doctor is aware  . Blood transfusion 2000  . GERD (gastroesophageal reflux disease)   . Arthritis   . Aortic dissection     Type 1  . Asthma   . Anxiety   . Depression   . Stroke     TIA- 2002  . Ulcer   . Diabetes mellitus without complication   . Anemia   . Blood transfusion without reported diagnosis   . Sleep apnea   . Platelets decreased     Past Surgical History  Procedure Laterality Date  . Back surgery      x 45  . Cholecystectomy    . Rotator cuff repair      left  . Abdominal hysterectomy      total  . Lumbar fusion  10/09  . Hemorroidectomy      and colon polyp removed  . Abdominal exploration surgery    . Posterior cervical fusion/foraminotomy    . Eye surgery      bilat  . Lumbar wound debridement  05/09/2011    Procedure: LUMBAR WOUND DEBRIDEMENT;  Surgeon: Dahlia Bailiff;  Location: [place];  Service: Orthopedics;  Laterality: N/A;  IRRIGATION AND DEBRIDEMENT SPINAL WOUND  . Axillary artery cannulation via 8-mm hemashield graft, median sternotomy, extracorporeal circulation with deep hypothermic circulatory arrest, repair of  aortic dessection  01/23/2009    Dr Arlyce Dice  . Cataract      both eyes  . Colonoscopy    . Liver biopsy  04/10/2012    Procedure: LIVER BIOPSY;  Surgeon: Inda Castle, MD;  Location: [facility];  Service: Endoscopy;  Laterality: N/A;  ultrasound to mark order for abd limited/liver to be marked to be sent by [email or web address]     Family History  Problem Relation Age of Onset  . Colon cancer Neg Hx   . Esophageal cancer Neg Hx   . Rectal cancer Neg Hx   . Breast cancer Mother   . Lung cancer Mother   . Stomach cancer Father   . Diabetes      4 aunts, and 1 uncle    Social History:  reports that she quit smoking about 22 months ago. Her smoking use included Cigarettes. She has a 25 pack-year smoking history. She has never used smokeless tobacco. She reports that she does not drink alcohol or use  illicit drugs.  The patient is alone today.  Allergies:  Allergies  Allergen Reactions  . Doxycycline Other (See Comments)    Face red and swollen; no difficulty breathing    Current Medications: Current Outpatient Prescriptions  Medication Sig Dispense Refill  . ABILIFY 30 MG tablet TAKE 1 TABLET BY MOUTH EVERY MORNING 30 tablet 0  . ammonium lactate (AMLACTIN) 12 % cream     . Blood Glucose Monitoring Suppl (ACCU-CHEK AVIVA) device Use as instructed to test blood sugar once daily Dx E11.8 (Patient taking differently: Use as instructed to test blood sugar twice daily) 1 each 0  . docusate sodium (COLACE) 100 MG capsule TAKE 1 CAPSULE BY MOUTH 2 TIMES DAILY AS NEEDED FOR CONSTIPATION 60 capsule 3  . glucose blood (ACCU-CHEK AVIVA) test strip Use as instructed to test blood sugar once daily Dx E11.8 100 each 3  . hyoscyamine (LEVBID) 0.375 MG 12 hr tablet Take 1 tablet (0.375 mg total) by mouth 2 (two) times daily. 60 tablet 0  . lamoTRIgine (LAMICTAL) 100 MG tablet Take 3 tablets by mouth once daily    . Lancets (ACCU-CHEK SOFT TOUCH) lancets Use as instructed to test blood sugar once daily Dx E11.8 100 each 3  . meloxicam (MOBIC) 7.5 MG tablet Take 1 tablet (7.5 mg total) by mouth daily. 30 tablet 0  . methscopolamine (PAMINE) 2.5 MG TABS tablet Take 1 tablet (2.5 mg total) by mouth 4 (four) times daily -  before meals and at bedtime. 120 tablet 3  . mirtazapine (REMERON) 15 MG tablet TAKE 1 TABLET BY MOUTH AT BEDTIME 34 tablet 2  . nystatin (MYCOSTATIN/NYSTOP) 100000 UNIT/GM POWD Apply to affected area 4 times a day 60 g 1  . nystatin-triamcinolone ointment (MYCOLOG) APPLY 1 APPLICATION TOPICALLY 2 (TWO) TIMES DAILY. (Patient not taking: Reported on 0/35/4656) 30 g 0  . salicylic acid 6 % gel APPLY TO AFFECTED AREA EVERY DAY *USE AT NIGHT AND RINSE OFF IN THE MORNING* 40 g 0  . Spacer/Aero-Holding Chambers (AEROCHAMBER Z-STAT PLUS) inhaler Use as instructed 1 each 0  . SYMBICORT 160-4.5  MCG/ACT inhaler INHALE 2 PUFFS INTO THE LUNGS 2 (TWO) TIMES DAILY. 10.2 Inhaler 0  . urea (CARMOL) 40 % CREA Apply nightly to feet    . clonazePAM (KLONOPIN) 0.5 MG tablet TAKE 1 TABLET BY MOUTH TWICE A DAY 60 tablet 0  . diazepam (VALIUM) 5 MG tablet TAKE 1 TABLET BY MOUTH TWICE A DAY 60 tablet 0  .  fluconazole (DIFLUCAN) 100 MG tablet Take 1 tablet (100 mg total) by mouth daily. (Patient not taking: Reported on 02/02/2015) 14 tablet 0  . fluticasone (CUTIVATE) 0.05 % cream APPLY TWICE A DAY TO AFFECTED AREAS, APPLY AFTER 1ST CREAM  2  . ketoconazole (NIZORAL) 2 % cream APPLY TWICE A DAY TO AFFECTED AREAS, APPLY TO SKIN FIRST  2  . metFORMIN (GLUCOPHAGE) 1000 MG tablet TAKE 1 TABLET (1,000 MG TOTAL) BY MOUTH 2 (TWO) TIMES DAILY WITH A MEAL. 180 tablet 1  . nystatin-triamcinolone ointment (MYCOLOG) Apply 1 application topically 2 (two) times daily. 30 g 0  . omeprazole (PRILOSEC) 40 MG capsule TAKE ONE CAPSULE BY MOUTH EVERY DAY 90 capsule 3  . oxyCODONE-acetaminophen (PERCOCET) 7.5-325 MG per tablet Take 1 tablet by mouth every 6 (six) hours as needed for severe pain. (Patient not taking: Reported on 02/02/2015) 12 tablet 0  . promethazine (PHENERGAN) 25 MG tablet TAKE 1/2 TO 1 TABLET BY MOUTH EVERY 8 HOURS AS NEEDED FOR NAUSEA 30 tablet 2  . rOPINIRole (REQUIP) 3 MG tablet TAKE 1 TABLET BY MOUTH AT BEDTIME 90 tablet 0  . simvastatin (ZOCOR) 10 MG tablet TAKE 1 TABLET BY MOUTH AT BEDTIME 90 tablet 1  . sulfamethoxazole-trimethoprim (BACTRIM DS,SEPTRA DS) 800-160 MG per tablet Take 1 tablet by mouth 2 (two) times daily. (Patient not taking: Reported on 02/02/2015) 20 tablet 0  . tolterodine (DETROL LA) 2 MG 24 hr capsule TAKE ONE CAPSULE BY MOUTH DAILY 90 capsule 0  . traMADol (ULTRAM) 50 MG tablet TAKE 2 TABLETS BY MOUTH EVERY MORNING AND 2 TABLETS BY MOUTH EVERY EVENING 120 tablet 0  . zolpidem (AMBIEN) 5 MG tablet TAKE 1 TABLET BY MOUTH AT BEDTIME AS NEEDED FOR SLEEP 30 tablet 0   No current  facility-administered medications for this visit.    Review of Systems:  GENERAL:  Feels bad.  Exhausted.  No fevers, sweats or weight loss. PERFORMANCE STATUS (ECOG):  1 HEENT:  Trouble with eyes.  Dry eyes.  Tickle in throat.  No runny nose, sore throat, mouth sores or tenderness. Lungs: Shortness of breath with exertion.  No cough.  No hemoptysis. Cardiac:  No chest pain, palpitations, orthopnea, or PND. GI:  No nausea, vomiting, diarrhea, constipation, melena or hematochezia. GU:  No urgency, frequency, dysuria, or hematuria. Musculoskeletal:  Hip hurts.  Arthritis pain.  No muscle tenderness. Extremities:  No pain or swelling. Skin:  No rashes or skin changes. Neuro:  No headache, numbness or weakness, balance or coordination issues. Endocrine:  Diabetes.  No thyroid issues, hot flashes or night sweats. Psych:  No mood changes, depression or anxiety. Pain:  No focal pain. Review of systems:  All other systems reviewed and found to be negative.  Physical Exam: Blood pressure 114/70, pulse 101, temperature 96.5 F (35.8 C), temperature source Tympanic, height 5' 3"  (1.6 m), weight 217 lb 13 oz (98.799 kg), SpO2 94 %. GENERAL:  Well developed, well nourished, sitting comfortably in the exam room in no acute distress. MENTAL STATUS:  Alert and oriented to person, place and time. HEAD:  Short gray hair.  Normocephalic, atraumatic, face symmetric, no Cushingoid features. EYES:  Blue eyes.  Pupils equal round and reactive to light and accomodation.  No conjunctivitis or scleral icterus. ENT:  Oropharynx clear without lesion.  Tongue normal. Mucous membranes moist.  RESPIRATORY:  Clear to auscultation without rales, wheezes or rhonchi. CARDIOVASCULAR:  Regular rate and rhythm without murmur, rub or gallop. ABDOMEN:  Large abdomen.  Soft, slightly tender in the upper abdomen without guarding or rebound tenderness. Active bowel sounds and no appreciable hepatosplenomegaly.  No masses. SKIN:   No rashes, ulcers or lesions. EXTREMITIES: No edema, no skin discoloration or tenderness.  No palpable cords. LYMPH NODES: No palpable cervical, supraclavicular, axillary or inguinal adenopathy  NEUROLOGICAL: Unremarkable. PSYCH:  Appropriate.  Clinical Support on 10/31/2014  Component Date Value Ref Range Status  . WBC 10/31/2014 5.5  3.6 - 11.0 K/uL Final  . RBC 10/31/2014 3.91  3.80 - 5.20 MIL/uL Final  . Hemoglobin 10/31/2014 12.0  12.0 - 16.0 g/dL Final  . HCT 10/31/2014 36.3  35.0 - 47.0 % Final  . MCV 10/31/2014 92.8  80.0 - 100.0 fL Final  . MCH 10/31/2014 30.7  26.0 - 34.0 pg Final  . MCHC 10/31/2014 33.1  32.0 - 36.0 g/dL Final  . RDW 10/31/2014 14.2  11.5 - 14.5 % Final  . Platelets 10/31/2014 119* 150 - 440 K/uL Final  . Hep B Core Total Ab 10/31/2014 Negative  Negative Final   Comment: (NOTE) Performed At: [facility] [place], [place] [id number] Lindon Romp MD [id number]   . CMV IgM 10/31/2014 <30.0  0.0 - 29.9 AU/mL Final   Comment: (NOTE)                                Negative         <30.0                                Equivocal  30.0 - 34.9                                Positive         >34.9 A positive result is generally indicative of acute infection, reactivation or persistent IgM production. Performed At: [facility] [address] [place], [place] [id number] Lindon Romp MD GQ:[id number]     Assessment:  Stephanie [name] is a 64 y.o. 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 Platelets have ranged between 108,000 and 128,000 without trend.  She denies any new medications or herbal products.  Work-up on 08/29/2014 revealed a positive rheumatoid factor and ANA (anti-double-stranded DNA of 5- equivocal) Negative studies included hepatitis B surface antibody and antigen, hepatitis C testing, and HIV testing.  B12, SPEP, and free light chains were normal.  Abdominal ultrasound on 09/15/2014  revealed mild splenomegaly (12.9 cm) with moderate cirrhosis and no focal liver lesions.  AFP was normal (4).  She has had a mild normocytic anemia. Ferritin was 75 on 09/29/2014.  Iron saturation was 6%. Reticulocyte count was 2.42%. Coombs was negative.  Last colonoscopy was in 08/2014.  Symptomatically, she is fatigued. Torus of breath with exertion. She denies any bleeding.  Plan: 1. Review entire medical history, diagnosis of thrombocytopenia without trend workup to date. Suspect etiology is due to cirrhosis and subsequent mild splenomegaly. 2. Labs today: CBC with diff, CMV serologies, hepatitis B core antibody.  3. RTC in 3 months for MD assessment  And labs (CBC with diff +/- others).   Lequita Asal, MD  10/31/2014

## 2015-02-10 ENCOUNTER — Other Ambulatory Visit: Payer: Self-pay | Admitting: Family Medicine

## 2015-02-11 ENCOUNTER — Telehealth: Payer: Self-pay | Admitting: Family Medicine

## 2015-02-11 ENCOUNTER — Encounter: Payer: Self-pay | Admitting: Family Medicine

## 2015-02-11 ENCOUNTER — Ambulatory Visit (INDEPENDENT_AMBULATORY_CARE_PROVIDER_SITE_OTHER): Payer: Commercial Managed Care - HMO | Admitting: Family Medicine

## 2015-02-11 VITALS — BP 120/60 | HR 93 | Temp 98.0°F | Wt 219.8 lb

## 2015-02-11 DIAGNOSIS — M609 Myositis, unspecified: Secondary | ICD-10-CM

## 2015-02-11 DIAGNOSIS — M791 Myalgia: Secondary | ICD-10-CM

## 2015-02-11 DIAGNOSIS — Z23 Encounter for immunization: Secondary | ICD-10-CM

## 2015-02-11 DIAGNOSIS — IMO0001 Reserved for inherently not codable concepts without codable children: Secondary | ICD-10-CM | POA: Insufficient documentation

## 2015-02-11 NOTE — Telephone Encounter (Signed)
Lm on pts vm requesting a call back

## 2015-02-11 NOTE — Telephone Encounter (Signed)
Rx called in to requested pharmacy 

## 2015-02-11 NOTE — Progress Notes (Signed)
Pre visit review using our clinic review tool, if applicable. No additional management support is needed unless otherwise documented below in the visit note. 

## 2015-02-11 NOTE — Patient Instructions (Signed)
Good to see you. Please stop by Rosaria Ferries on your way out.

## 2015-02-11 NOTE — Assessment & Plan Note (Signed)
>  25 minutes spent in face to face time with patient, >50% spent in counselling or coordination of care discussing worsening myalgias, fatigue and she also wanted to discuss NASH with me as well. Referral placed to rheumatology. The patient indicates understanding of these issues and agrees with the plan.

## 2015-02-11 NOTE — Telephone Encounter (Signed)
Pt made rheumatology appointment with Rosaria Ferries today at check out, and was not able to get in until the end of October. Patient stopped at my desk on the way out and asked if there was anything else Dr. Deborra Medina could do for her until the appointment due to pain? Her best call back number is [phone]. Thank you.

## 2015-02-11 NOTE — Telephone Encounter (Signed)
Last f/u appt 08/2014-CPE

## 2015-02-11 NOTE — Telephone Encounter (Signed)
Spoke to pt who states that she is no longer a pt at the pain clinic. She only attended pain clinic for one appt. She states was not interested in starting narcotics and decided not to attend any additional appts

## 2015-02-11 NOTE — Progress Notes (Signed)
Subjective:   Patient ID: Stephanie [name], female    DOB: May 04, 1951, 64 y.o.   MRN: [id number]  Stephanie [name] is a pleasant 64 y.o. year old female who presents to clinic today with Follow-up  on 02/11/2015  HPI:  Has been followed by hematology for thrombocytopenia/anemia.  History of NASH.  Most recently saw Dr. Nolon Stalls last week- note reviewed from 02/02/15. Given previous borderline rheum labs in past and now with multiple trigger points on exam and chronic fatigue, Dr. Mike Gip suggested possible rheumatology referral.  Stephanie [name] is here to discuss this today.  She endorses more pain in her shoulders and hips bilaterally and more fatigue as well.  Lab Results  Component Value Date   WBC 5.8 02/02/2015   HGB 11.8* 02/02/2015   HCT 35.0 02/02/2015   MCV 90.2 02/02/2015   PLT 122* 02/02/2015    Current Outpatient Prescriptions on File Prior to Visit  Medication Sig Dispense Refill  . ABILIFY 30 MG tablet TAKE 1 TABLET BY MOUTH EVERY MORNING 30 tablet 0  . ammonium lactate (AMLACTIN) 12 % cream     . Blood Glucose Monitoring Suppl (ACCU-CHEK AVIVA) device Use as instructed to test blood sugar once daily Dx E11.8 (Patient taking differently: Use as instructed to test blood sugar twice daily) 1 each 0  . clonazePAM (KLONOPIN) 0.5 MG tablet TAKE 1 TABLET BY MOUTH TWICE A DAY 60 tablet 0  . diazepam (VALIUM) 5 MG tablet TAKE 1 TABLET BY MOUTH TWICE A DAY 60 tablet 0  . docusate sodium (COLACE) 100 MG capsule TAKE 1 CAPSULE BY MOUTH 2 TIMES DAILY AS NEEDED FOR CONSTIPATION 60 capsule 3  . fluticasone (CUTIVATE) 0.05 % cream APPLY TWICE A DAY TO AFFECTED AREAS, APPLY AFTER 1ST CREAM  2  . glucose blood (ACCU-CHEK AVIVA) test strip Use as instructed to test blood sugar once daily Dx E11.8 100 each 3  . hyoscyamine (LEVBID) 0.375 MG 12 hr tablet Take 1 tablet (0.375 mg total) by mouth 2 (two) times daily. 60 tablet 0  . ketoconazole (NIZORAL) 2 % cream APPLY TWICE A DAY TO  AFFECTED AREAS, APPLY TO SKIN FIRST  2  . lamoTRIgine (LAMICTAL) 100 MG tablet Take 3 tablets by mouth once daily    . Lancets (ACCU-CHEK SOFT TOUCH) lancets Use as instructed to test blood sugar once daily Dx E11.8 100 each 3  . meloxicam (MOBIC) 7.5 MG tablet Take 1 tablet (7.5 mg total) by mouth daily. 30 tablet 0  . metFORMIN (GLUCOPHAGE) 1000 MG tablet TAKE 1 TABLET (1,000 MG TOTAL) BY MOUTH 2 (TWO) TIMES DAILY WITH A MEAL. 180 tablet 1  . methscopolamine (PAMINE) 2.5 MG TABS tablet Take 1 tablet (2.5 mg total) by mouth 4 (four) times daily -  before meals and at bedtime. 120 tablet 3  . mirtazapine (REMERON) 15 MG tablet TAKE 1 TABLET BY MOUTH AT BEDTIME 34 tablet 2  . nystatin (MYCOSTATIN/NYSTOP) 100000 UNIT/GM POWD Apply to affected area 4 times a day 60 g 1  . nystatin-triamcinolone ointment (MYCOLOG) Apply 1 application topically 2 (two) times daily. 30 g 0  . omeprazole (PRILOSEC) 40 MG capsule TAKE ONE CAPSULE BY MOUTH EVERY DAY 90 capsule 3  . promethazine (PHENERGAN) 25 MG tablet TAKE 1/2 TO 1 TABLET BY MOUTH EVERY 8 HOURS AS NEEDED FOR NAUSEA 30 tablet 2  . rOPINIRole (REQUIP) 3 MG tablet TAKE 1 TABLET BY MOUTH AT BEDTIME 90 tablet 0  . salicylic acid 6 %  gel APPLY TO AFFECTED AREA EVERY DAY *USE AT NIGHT AND RINSE OFF IN THE MORNING* 40 g 0  . simvastatin (ZOCOR) 10 MG tablet TAKE 1 TABLET BY MOUTH AT BEDTIME 90 tablet 1  . Spacer/Aero-Holding Chambers (AEROCHAMBER Z-STAT PLUS) inhaler Use as instructed 1 each 0  . sulfamethoxazole-trimethoprim (BACTRIM DS,SEPTRA DS) 800-160 MG per tablet Take 1 tablet by mouth 2 (two) times daily. 20 tablet 0  . SYMBICORT 160-4.5 MCG/ACT inhaler INHALE 2 PUFFS INTO THE LUNGS 2 (TWO) TIMES DAILY. 10.2 Inhaler 0  . tolterodine (DETROL LA) 2 MG 24 hr capsule TAKE ONE CAPSULE BY MOUTH DAILY 90 capsule 0  . traMADol (ULTRAM) 50 MG tablet TAKE 2 TABLETS BY MOUTH EVERY MORNING AND 2 TABLETS BY MOUTH EVERY EVENING 120 tablet 0  . urea (CARMOL) 40 % CREA  Apply nightly to feet    . zolpidem (AMBIEN) 5 MG tablet TAKE 1 TABLET BY MOUTH AT BEDTIME AS NEEDED FOR SLEEP 30 tablet 0   No current facility-administered medications on file prior to visit.    Allergies  Allergen Reactions  . Doxycycline Other (See Comments)    Face red and swollen; no difficulty breathing    Past Medical History  Diagnosis Date  . Adenomatous colon polyp   . Insomnia   . RLS (restless legs syndrome)   . Colon polyp   . Incontinence   . Hepatitis B, chronic   . H/O: rheumatic fever   . Hyperlipidemia   . Unspecified disorders of nervous system   . Bipolar affective disorder     h/o  . H/O: CVA (cardiovascular accident)     TIA  . Fatty liver 04/09/08    found in abd CT  . Shortness of breath   . Obstructive sleep apnea     not using CPAP, last sleep study 2.5 years ago, per pt doctor is aware  . Blood transfusion 2000  . GERD (gastroesophageal reflux disease)   . Arthritis   . Aortic dissection     Type 1  . Asthma   . Anxiety   . Depression   . Stroke     TIA- 2002  . Ulcer   . Diabetes mellitus without complication   . Anemia   . Blood transfusion without reported diagnosis   . Sleep apnea   . Platelets decreased     Past Surgical History  Procedure Laterality Date  . Back surgery      x 45  . Cholecystectomy    . Rotator cuff repair      left  . Abdominal hysterectomy      total  . Lumbar fusion  10/09  . Hemorroidectomy      and colon polyp removed  . Abdominal exploration surgery    . Posterior cervical fusion/foraminotomy    . Eye surgery      bilat  . Lumbar wound debridement  05/09/2011    Procedure: LUMBAR WOUND DEBRIDEMENT;  Surgeon: Dahlia Bailiff;  Location: [place];  Service: Orthopedics;  Laterality: N/A;  IRRIGATION AND DEBRIDEMENT SPINAL WOUND  . Axillary artery cannulation via 8-mm hemashield graft, median sternotomy, extracorporeal circulation with deep hypothermic circulatory arrest, repair of  aortic dessection   01/23/2009    Dr Arlyce Dice  . Cataract      both eyes  . Colonoscopy    . Liver biopsy  04/10/2012    Procedure: LIVER BIOPSY;  Surgeon: Inda Castle, MD;  Location: [facility];  Service: Endoscopy;  Laterality: N/A;  ultrasound to mark order for abd limited/liver to be marked to be sent by [email or web address]     Family History  Problem Relation Age of Onset  . Colon cancer Neg Hx   . Esophageal cancer Neg Hx   . Rectal cancer Neg Hx   . Breast cancer Mother   . Lung cancer Mother   . Stomach cancer Father   . Diabetes      4 aunts, and 1 uncle    Social History   Social History  . Marital Status: Divorced    Spouse Name: N/A  . Number of Children: 1  . Years of Education: N/A   Occupational History  . Disabled    Social History Main Topics  . Smoking status: Former Smoker -- 0.50 packs/day for 50 years    Types: Cigarettes    Quit date: 04/06/2013  . Smokeless tobacco: Never Used  . Alcohol Use: No  . Drug Use: No  . Sexual Activity: Not on file   Other Topics Concern  . Not on file   Social History Narrative   1 son, 40+ y/o   Daily caffeine use: 2 cups daily   Does not get regular exercise   Disability due to bipolar      Has a living will- she is a DNR (she has form at home per pt).   The PMH, PSH, Social History, Family History, Medications, and allergies have been reviewed in [facility], and have been updated if relevant.    Review of Systems  Constitutional: Positive for fatigue. Negative for fever.  HENT: Negative.   Respiratory: Negative.   Cardiovascular: Negative.   Gastrointestinal: Negative.   Endocrine: Negative.   Genitourinary: Negative.   Musculoskeletal: Positive for myalgias and arthralgias.  Skin: Negative.   Allergic/Immunologic: Negative.   Neurological: Negative.   Hematological: Negative.   Psychiatric/Behavioral: Negative.   All other systems reviewed and are negative.      Objective:    BP 120/60 mmHg  Pulse 93  Temp(Src) 98  F (36.7 C) (Oral)  Wt 219 lb 12 oz (99.678 kg)  SpO2 92%   Physical Exam  Constitutional: She is oriented to person, place, and time. She appears well-developed and well-nourished. No distress.  HENT:  Head: Normocephalic and atraumatic.  Eyes: Conjunctivae are normal.  Cardiovascular: Normal rate.   Pulmonary/Chest: Effort normal.  Neurological: She is alert and oriented to person, place, and time. No cranial nerve deficit.  Skin: Skin is warm and dry.  Psychiatric: She has a normal mood and affect. Her behavior is normal. Judgment and thought content normal.  Nursing note and vitals reviewed.         Assessment & Plan:   Myalgia and myositis No Follow-up on file.

## 2015-02-11 NOTE — Telephone Encounter (Signed)
I cannot unfortunately because she is being managed at a pain clinic.

## 2015-02-12 IMAGING — CT CT CHEST W/O CM
3 of 4 series · 15 of 30 positions shown, 16 images · non-contrast
Comparison: CT 07/02/2013

CLINICAL DATA: Ground-glass opacities, followup lung nodule. Former
smoker. Cough and shortness of breath.

EXAM:
CT CHEST WITHOUT CONTRAST
TECHNIQUE: Multidetector CT imaging of the chest was performed following the
standard protocol without IV contrast..

[Series 3: chest w/o · axial · non-contrast · 0.78mm/px · z∈[-162,-18]mm · 4 of 49 slices shown, 5 images]
[im 10/49  mediastinal]
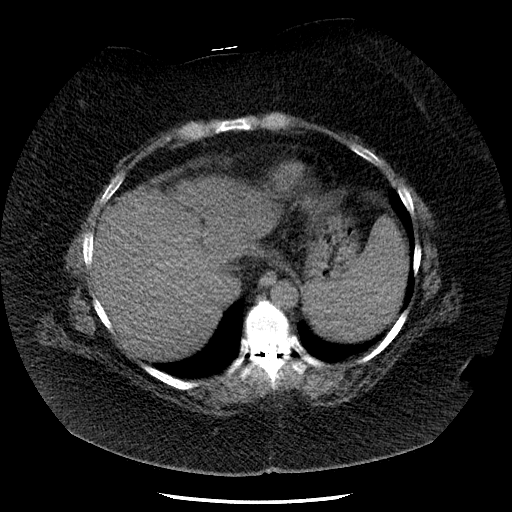
[im 10/49  lung]
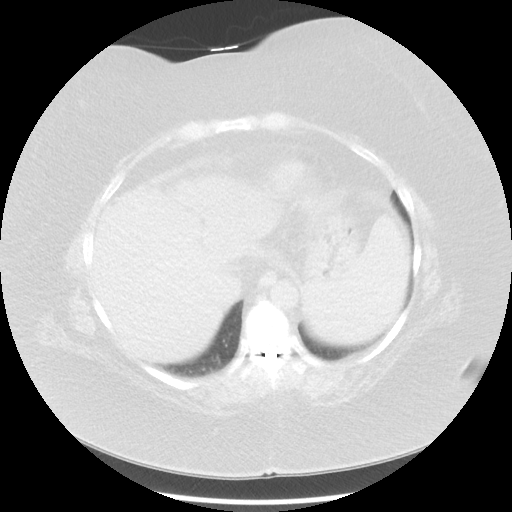
[im 20/49  lung]
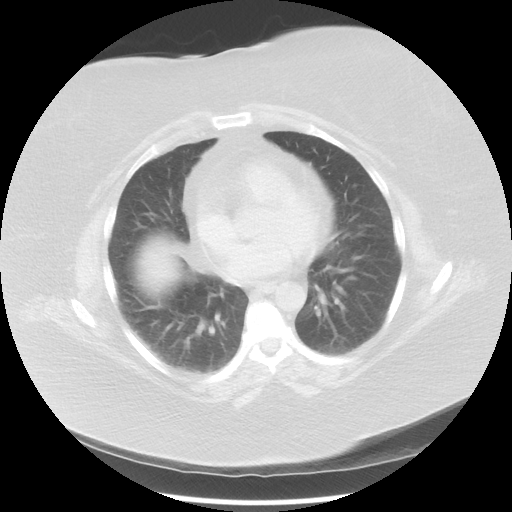
[im 29/49  lung]
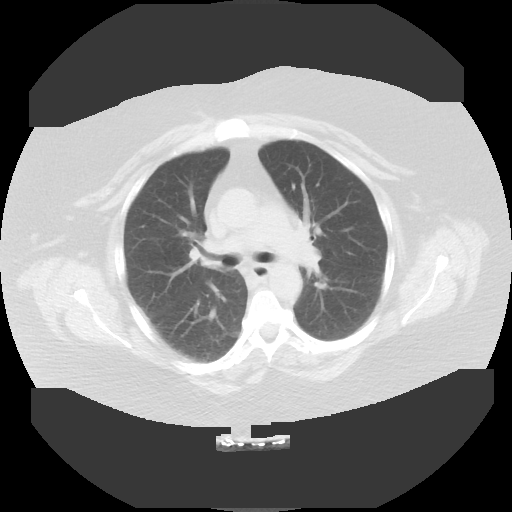
[im 39/49  lung]
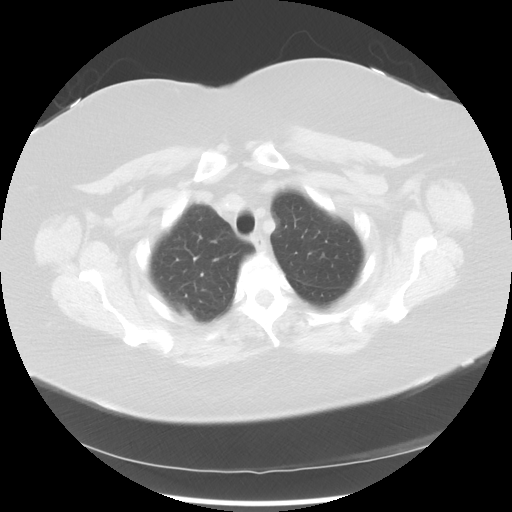

[Series 4: lung windows · axial · 0.78mm/px · z∈[-148,-28]mm · 3 of 49 slices shown]
[im 13/49  lung]
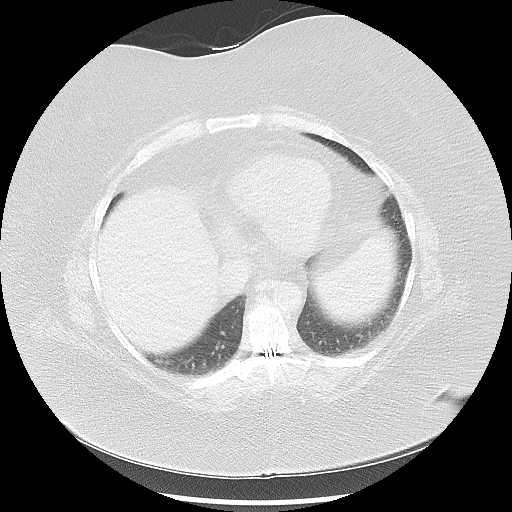
[im 25/49  lung]
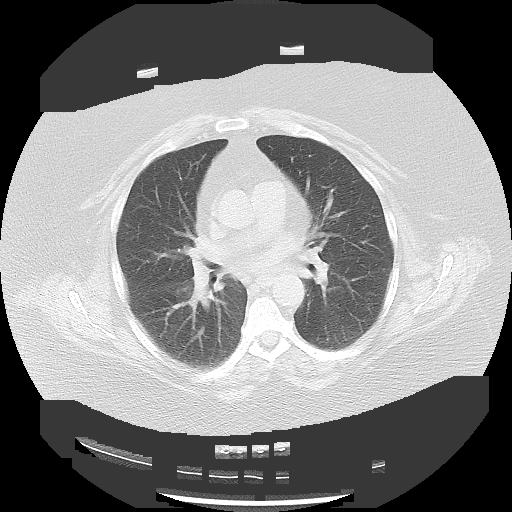
[im 37/49  lung]
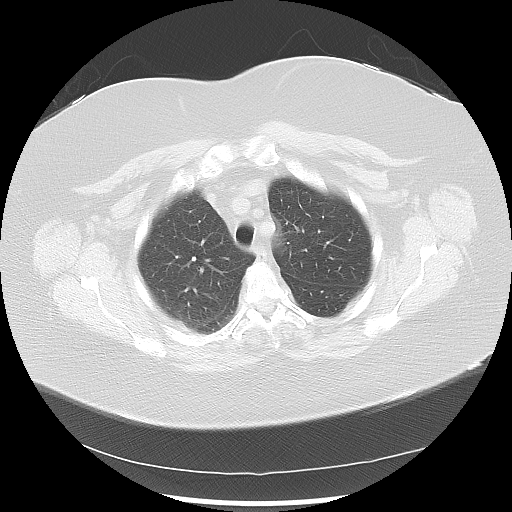

[Series 602: sagittal body · sagittal · 0.78mm/px · 8 of 161 slices shown]
[im 11/161  mediastinal]
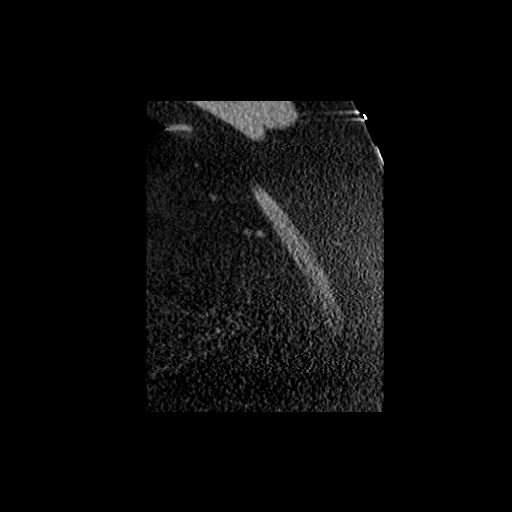
[im 31/161  mediastinal]
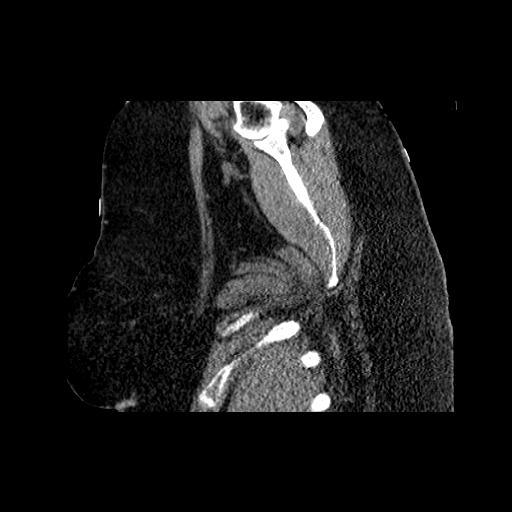
[im 51/161  mediastinal]
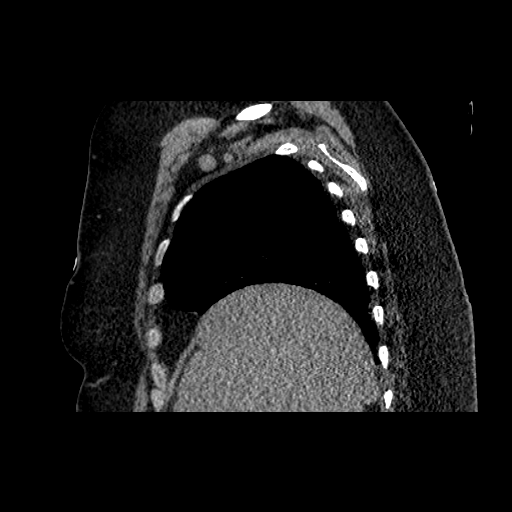
[im 71/161  mediastinal]
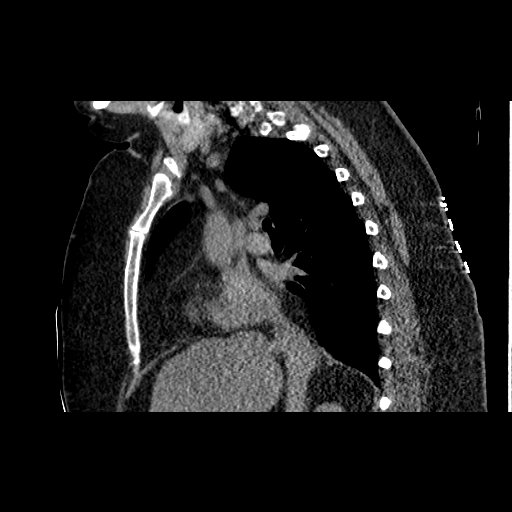
[im 91/161  mediastinal]
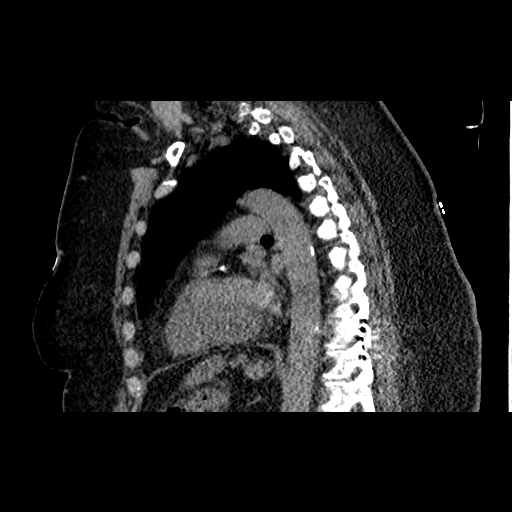
[im 111/161  mediastinal]
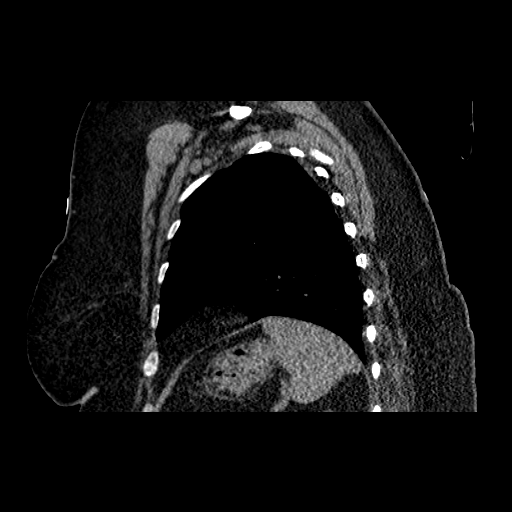
[im 131/161  mediastinal]
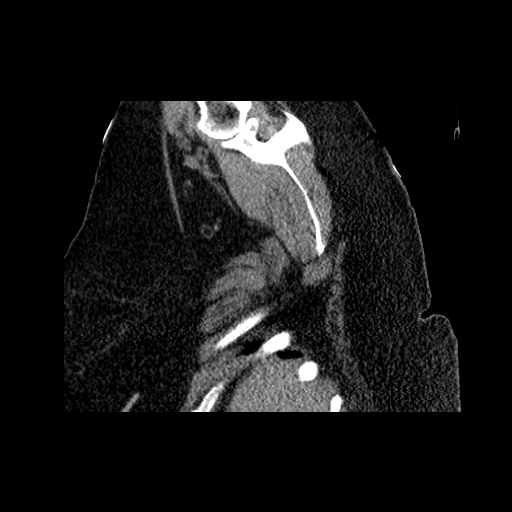
[im 151/161  mediastinal]
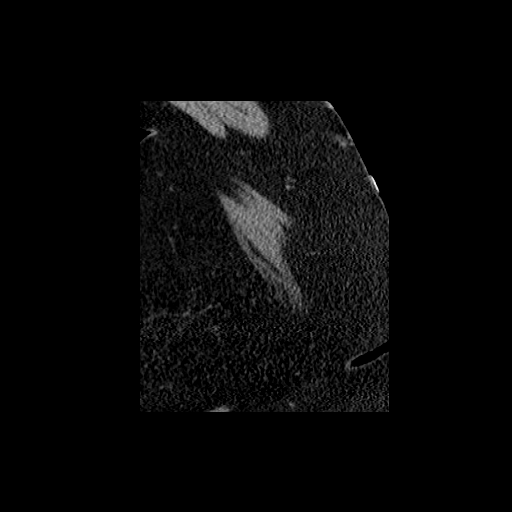

[15 of 30 positions shown; findings below may reference images not displayed]

FINDINGS: No axillary or supraclavicular lymphadenopathy. No mediastinal hilar
lymphadenopathy. No pericardial fluid. Coronary artery
calcifications are present. Esophagus is normal.

Review of the lung parenchyma demonstrates a ground-glass nodule in
the right lung apex measuring is 8 mm not changed from 7 mm on CT of
06/22/2011. Additional pulmonary nodules are present. No pleural
fluid.

Limited view of the liver, kidneys, pancreas are unremarkable.

No aggressive osseous lesion.
IMPRESSION: Ground-glass nodule at the right lung apex stable over 3 year year
interval.

Annual surveillance for a minimum of 3 years has been satisfied.
Considered continued annual follow-up as clinically indicated.

These recommendations are taken from:Recommendations for the
Management of Subsolid Pulmonary Nodules Detected at CT: A Statement
from the [HOSPITAL]

## 2015-02-12 NOTE — Telephone Encounter (Signed)
Called pt back and phone was busy twice

## 2015-02-12 NOTE — Telephone Encounter (Signed)
Left voicemail requesting pt to call office back 

## 2015-02-12 NOTE — Telephone Encounter (Signed)
I do not manage chronic pain.  We could certainly try a short course of tramadol if she can tolerate this until she can see the rheumatologist.

## 2015-02-12 NOTE — Telephone Encounter (Signed)
Pt returned your call    Call back  # [phone]

## 2015-02-12 NOTE — Telephone Encounter (Signed)
Patient returned Shapale's call.  Please call her back at [phone].

## 2015-02-13 NOTE — Telephone Encounter (Signed)
Stephanie [name] returned call and was notified of Dr. Hulen Shouts comments.  She agrees to trial of tramadol.  She currently has a prescription at home and does not need a refill at this time.  Stephanie [name] instructed to call back if she experiences little to no relief.

## 2015-02-13 NOTE — Telephone Encounter (Signed)
Left message for patient to return call.

## 2015-02-16 NOTE — Telephone Encounter (Addendum)
Pt left v/m; pt has been taking tramadol for neck and back pain due to arthritis; pain no better; is there anything else to do. Pt request cb. [facility].

## 2015-02-16 NOTE — Telephone Encounter (Signed)
Unfortunately there is really nothing more I can offer until she sees rheumatology.

## 2015-02-16 NOTE — Telephone Encounter (Signed)
Called patient to let her know of Dr. Hulen Shouts comments. She states that her pain is so bad, and they couldn't get her appointment with rheumatology until the 1st of November, and she doesn't know what to do. Patint states is there any at home remedies or suggestions that could ease pain, or any medications that could help until her appointment.

## 2015-02-16 NOTE — Telephone Encounter (Signed)
Pt left v/m requesting cb to see what other med could be given besides tramadol.

## 2015-02-17 ENCOUNTER — Other Ambulatory Visit: Payer: Self-pay | Admitting: Family Medicine

## 2015-02-17 NOTE — Telephone Encounter (Signed)
Rx called in to requested pharmacy 

## 2015-02-17 NOTE — Telephone Encounter (Signed)
Lm on pts vm requesting a call back

## 2015-02-17 NOTE — Telephone Encounter (Signed)
Last f/u 02/2015

## 2015-02-17 NOTE — Telephone Encounter (Signed)
We could refer her back to the pain clinic if she prefers.

## 2015-02-17 NOTE — Telephone Encounter (Signed)
Spoke to pt and advised per Dr Deborra Medina. Pt is not wanting to be referred to the pain clinic because she "just didn't like it."

## 2015-02-19 ENCOUNTER — Emergency Department: Payer: Commercial Managed Care - HMO

## 2015-02-19 ENCOUNTER — Other Ambulatory Visit: Payer: Self-pay

## 2015-02-19 ENCOUNTER — Emergency Department
Admission: EM | Admit: 2015-02-19 | Discharge: 2015-02-20 | Disposition: A | Discharge status: Home / Self Care | Payer: Commercial Managed Care - HMO | Attending: Student | Admitting: Student

## 2015-02-19 ENCOUNTER — Encounter: Payer: Self-pay | Admitting: Emergency Medicine

## 2015-02-19 DIAGNOSIS — Z791 Long term (current) use of non-steroidal anti-inflammatories (NSAID): Secondary | ICD-10-CM | POA: Diagnosis not present

## 2015-02-19 DIAGNOSIS — I10 Essential (primary) hypertension: Secondary | ICD-10-CM | POA: Insufficient documentation

## 2015-02-19 DIAGNOSIS — Z79899 Other long term (current) drug therapy: Secondary | ICD-10-CM | POA: Diagnosis not present

## 2015-02-19 DIAGNOSIS — Z87891 Personal history of nicotine dependence: Secondary | ICD-10-CM | POA: Diagnosis not present

## 2015-02-19 DIAGNOSIS — E119 Type 2 diabetes mellitus without complications: Secondary | ICD-10-CM | POA: Insufficient documentation

## 2015-02-19 DIAGNOSIS — F319 Bipolar disorder, unspecified: Secondary | ICD-10-CM | POA: Insufficient documentation

## 2015-02-19 DIAGNOSIS — R079 Chest pain, unspecified: Secondary | ICD-10-CM

## 2015-02-19 DIAGNOSIS — R0789 Other chest pain: Secondary | ICD-10-CM | POA: Insufficient documentation

## 2015-02-19 LAB — COMPREHENSIVE METABOLIC PANEL
ALBUMIN: 3.8 g/dL (ref 3.5–5.0)
ALT: 25 U/L (ref 14–54)
ANION GAP: 9 (ref 5–15)
AST: 58 U/L — AB (ref 15–41)
Alkaline Phosphatase: 142 U/L — ABNORMAL HIGH (ref 38–126)
BUN: 14 mg/dL (ref 6–20)
CHLORIDE: 105 mmol/L (ref 101–111)
CO2: 27 mmol/L (ref 22–32)
Calcium: 9.3 mg/dL (ref 8.9–10.3)
Creatinine, Ser: 0.84 mg/dL (ref 0.44–1.00)
GFR calc Af Amer: >60 mL/min (ref >60)
Glucose, Bld: 98 mg/dL (ref 65–99)
POTASSIUM: 4 mmol/L (ref 3.5–5.1)
Sodium: 141 mmol/L (ref 135–145)
Total Bilirubin: 0.6 mg/dL (ref 0.3–1.2)
Total Protein: 7.2 g/dL (ref 6.5–8.1)

## 2015-02-19 LAB — CBC WITH DIFFERENTIAL/PLATELET
BASOS ABS: 0.1 10*3/uL (ref 0–0.1)
BASOS PCT: 1 %
EOS PCT: 1 %
Eosinophils Absolute: 0.1 10*3/uL (ref 0–0.7)
HCT: 36.5 % (ref 35.0–47.0)
Hemoglobin: 12.2 g/dL (ref 12.0–16.0)
Lymphocytes Relative: 21 %
Lymphs Abs: 1.5 10*3/uL (ref 1.0–3.6)
MCH: 30.4 pg (ref 26.0–34.0)
MCHC: 33.5 g/dL (ref 32.0–36.0)
MCV: 90.9 fL (ref 80.0–100.0)
MONO ABS: 0.5 10*3/uL (ref 0.2–0.9)
Monocytes Relative: 7 %
Neutro Abs: 4.7 10*3/uL (ref 1.4–6.5)
Neutrophils Relative %: 70 %
PLATELETS: 132 10*3/uL — AB (ref 150–440)
RBC: 4.01 MIL/uL (ref 3.80–5.20)
RDW: 14.8 % — AB (ref 11.5–14.5)
WBC: 6.8 10*3/uL (ref 3.6–11.0)

## 2015-02-19 LAB — TROPONIN I

## 2015-02-19 LAB — FIBRIN DERIVATIVES D-DIMER (ARMC ONLY): Fibrin derivatives D-dimer (ARMC): 672 — ABNORMAL HIGH (ref 0–499)

## 2015-02-19 MED ORDER — SODIUM CHLORIDE 0.9 % IV BOLUS (SEPSIS)
1000.0000 mL | Freq: Once | INTRAVENOUS | Status: AC
  Administered 2015-02-19: 1000 mL via INTRAVENOUS

## 2015-02-19 MED ORDER — IOHEXOL 350 MG/ML SOLN
100.0000 mL | Freq: Once | INTRAVENOUS | Status: AC | PRN
  Administered 2015-02-19: 100 mL via INTRAVENOUS

## 2015-02-19 MED ORDER — ONDANSETRON HCL 4 MG/2ML IJ SOLN
4.0000 mg | Freq: Once | INTRAMUSCULAR | Status: AC
  Administered 2015-02-19: 4 mg via INTRAVENOUS
  Filled 2015-02-19: qty 2

## 2015-02-19 MED ORDER — MORPHINE SULFATE (PF) 4 MG/ML IV SOLN
4.0000 mg | Freq: Once | INTRAVENOUS | Status: AC
  Administered 2015-02-19: 4 mg via INTRAVENOUS
  Filled 2015-02-19: qty 1

## 2015-02-19 NOTE — ED Notes (Signed)
Pt comes into the ED via EMS c/o chest pain with a sudden onset as she was watching TV.  Chest pain is on the right side and radiates up her neck and into the right arm.  Patient was found with labored breathing and diaphoretic.  Patient self administered 325 of aspiring and was given 2 nitros in the field with EMS.  120/60, 99% room air, EKG unremarkable,

## 2015-02-19 NOTE — ED Provider Notes (Signed)
[facility] Emergency Department Provider Note  ____________________________________________  Time seen: Approximately 6:08 PM  I have reviewed the triage vital signs and the nursing notes.   HISTORY  Chief Complaint Chest Pain    HPI Stephanie [name] is a 64 y.o. female with history of hyperlipidemia, diabetes, bipolar disorder, GERD, fibromyalgia who presents for evaluation of sudden onset constant stabbing left-sided chest pain which began approximately 45 minutes prior to arrival while she was sitting on her bed watching television. It radiates into the right shoulder and right neck. It is worse with deep inspiration as well as palpation and movement. It is not exertional. She is having associated shortness of breath and nausea. She has never had this type of pain before. She took full dose aspirin prior to calling EMS. On their arrival she is appeared to be in significant distress however this improved in route. Currently her pain is severe. She denies any recent illness including no cough, sneezing, runny nose, congestion, vomiting, diarrhea, fevers or chills. She received 2 nitroglycerin  tabs from EMS without improvement of her symptoms.   Past Medical History  Diagnosis Date  . Adenomatous colon polyp   . Insomnia   . RLS (restless legs syndrome)   . Colon polyp   . Incontinence   . Hepatitis B, chronic   . H/O: rheumatic fever   . Hyperlipidemia   . Unspecified disorders of nervous system   . Bipolar affective disorder     h/o  . H/O: CVA (cardiovascular accident)     TIA  . Fatty liver 04/09/08    found in abd CT  . Shortness of breath   . Obstructive sleep apnea     not using CPAP, last sleep study 2.5 years ago, per pt doctor is aware  . Blood transfusion 2000  . GERD (gastroesophageal reflux disease)   . Arthritis   . Aortic dissection     Type 1  . Asthma   . Anxiety   . Depression   . Stroke     TIA- 2002  . Ulcer   . Diabetes  mellitus without complication   . Anemia   . Blood transfusion without reported diagnosis   . Sleep apnea   . Platelets decreased     Patient Active Problem List   Diagnosis Date Noted  . Myalgia and myositis 02/11/2015  . Yeast infection 12/15/2014  . Intertrigo 12/15/2014  . Thrombocytopenia 08/19/2014  . IGTN (ingrowing toe nail) 08/19/2014  . Abdominal pain, chronic, epigastric 06/12/2014  . Elevated blood pressure 03/27/2014  . Liver cirrhosis secondary to NASH 02/27/2014  . Abnormal chest CT 02/21/2014  . Chest wall pain 12/29/2013  . Dizziness and giddiness 10/30/2013  . DOE (dyspnea on exertion) 10/30/2013  . Nausea alone 10/30/2013  . Diabetes mellitus type 2, controlled, with complications 20/80/2233  . DNR (do not resuscitate) 07/29/2013  . Encounter for routine gynecological examination 07/29/2013  . Dyspnea 07/10/2013  . Lung nodule 07/10/2013  . Right foot pain 05/20/2013  . Varicose veins of lower extremities with other complications 61/22/4497  . GERD (gastroesophageal reflux disease) 10/09/2012  . Cirrhosis of liver 02/02/2012  . Anxiety 07/11/2011  . Aortic dissection   . BACK PAIN, LUMBAR, CHRONIC 11/19/2009  . UNS ADVRS EFF OTH RX MEDICINAL&BIOLOGICAL SBSTNC 12/16/2008  . UNSPECIFIED VITAMIN D DEFICIENCY 09/26/2008  . TOBACCO ABUSE 06/19/2008  . RESTLESS LEGS SYNDROME 04/27/2007  . INSOMNIA 04/27/2007  . Benign neoplasm of colon 04/04/2007  .  HLD (hyperlipidemia) 03/08/2007  . OSA (obstructive sleep apnea) 03/08/2007  . Essential hypertension 03/08/2007  . Asthma 03/08/2007  . BIPOLAR AFFECTIVE DISORDER, HX OF 03/08/2007    Past Surgical History  Procedure Laterality Date  . Back surgery      x 45  . Cholecystectomy    . Rotator cuff repair      left  . Abdominal hysterectomy      total  . Lumbar fusion  10/09  . Hemorroidectomy      and colon polyp removed  . Abdominal exploration surgery    . Posterior cervical fusion/foraminotomy     . Eye surgery      bilat  . Lumbar wound debridement  05/09/2011    Procedure: LUMBAR WOUND DEBRIDEMENT;  Surgeon: Dahlia Bailiff;  Location: [facility];  Service: Orthopedics;  Laterality: N/A;  IRRIGATION AND DEBRIDEMENT SPINAL WOUND  . Axillary artery cannulation via 8-mm hemashield graft, median sternotomy, extracorporeal circulation with deep hypothermic circulatory arrest, repair of  aortic dessection  01/23/2009    Dr Arlyce Dice  . Cataract      both eyes  . Colonoscopy    . Liver biopsy  04/10/2012    Procedure: LIVER BIOPSY;  Surgeon: Inda Castle, MD;  Location: [facility];  Service: Endoscopy;  Laterality: N/A;  ultrasound to mark order for abd limited/liver to be marked to be sent by [email or web address]     Current Outpatient Rx  Name  Route  Sig  Dispense  Refill  . ABILIFY 30 MG tablet      TAKE 1 TABLET BY MOUTH EVERY MORNING   30 tablet   0   . clonazePAM (KLONOPIN) 0.5 MG tablet      TAKE 1 TABLET BY MOUTH TWICE A DAY   60 tablet   0     Not to exceed 5 additional fills before 06/13/2015 ...   . diazepam (VALIUM) 5 MG tablet      TAKE 1 TABLET BY MOUTH TWICE A DAY   60 tablet   0     Not to exceed 5 additional fills before 06/13/2015 ...   . docusate sodium (COLACE) 100 MG capsule      TAKE 1 CAPSULE BY MOUTH 2 TIMES DAILY AS NEEDED FOR CONSTIPATION   60 capsule   3   . lamoTRIgine (LAMICTAL) 100 MG tablet      Take 3 tablets by mouth once daily         . meloxicam (MOBIC) 7.5 MG tablet   Oral   Take 1 tablet (7.5 mg total) by mouth daily.   30 tablet   0   . metFORMIN (GLUCOPHAGE) 1000 MG tablet      TAKE 1 TABLET (1,000 MG TOTAL) BY MOUTH 2 (TWO) TIMES DAILY WITH A MEAL.   180 tablet   1   . methscopolamine (PAMINE) 2.5 MG TABS tablet   Oral   Take 1 tablet by mouth 5 (five) times daily.      2   . mirtazapine (REMERON) 15 MG tablet      TAKE 1 TABLET BY MOUTH AT BEDTIME   34 tablet   2   . omeprazole (PRILOSEC) 40 MG capsule       TAKE ONE CAPSULE BY MOUTH EVERY DAY   90 capsule   3   . rOPINIRole (REQUIP) 3 MG tablet      TAKE 1 TABLET BY MOUTH AT BEDTIME   90 tablet   0   .  simvastatin (ZOCOR) 10 MG tablet      TAKE 1 TABLET BY MOUTH AT BEDTIME   90 tablet   1     NEEDS REFILLS   . SYMBICORT 160-4.5 MCG/ACT inhaler      INHALE 2 PUFFS INTO THE LUNGS 2 (TWO) TIMES DAILY.   10.2 Inhaler   0     Patient must schedule office visit before future r ...   . tolterodine (DETROL LA) 2 MG 24 hr capsule   Oral   Take 2 mg by mouth daily.      0   . traMADol (ULTRAM) 50 MG tablet   Oral   Take 2 tablets by mouth 2 (two) times daily.      0   . zolpidem (AMBIEN) 5 MG tablet      TAKE 1 TABLET BY MOUTH AT BEDTIME AS NEEDED FOR SLEEP   30 tablet   0     Not to exceed 5 additional fills before 06/14/2015 ...   . promethazine (PHENERGAN) 25 MG tablet      TAKE 1/2 TO 1 TABLET BY MOUTH EVERY 8 HOURS AS NEEDED FOR NAUSEA   30 tablet   2     Allergies Doxycycline  Family History  Problem Relation Age of Onset  . Colon cancer Neg Hx   . Esophageal cancer Neg Hx   . Rectal cancer Neg Hx   . Breast cancer Mother   . Lung cancer Mother   . Stomach cancer Father   . Diabetes      4 aunts, and 1 uncle    Social History Social History  Substance Use Topics  . Smoking status: Former Smoker -- 0.50 packs/day for 50 years    Types: Cigarettes    Quit date: 04/06/2013  . Smokeless tobacco: Never Used  . Alcohol Use: No    Review of Systems Constitutional: No fever/chills Eyes: No visual changes. ENT: No sore throat. Cardiovascular: + chest pain. Respiratory: +shortness of breath. Gastrointestinal: No abdominal pain.  No nausea, no vomiting.  No diarrhea.  No constipation. Genitourinary: Negative for dysuria. Musculoskeletal: Negative for back pain. Skin: Negative for rash. Neurological: Negative for headaches, focal weakness or numbness.  10-point ROS otherwise  negative.  ____________________________________________   PHYSICAL EXAM:  Filed Vitals:   02/19/15 1931 02/19/15 2000 02/19/15 2030 02/19/15 2130  BP: 120/61 118/59 109/63 116/50  Pulse:  92 94 91  Temp:      TempSrc:      Resp:  17 17 12   SpO2:  92% 92% 93%     Constitutional: Alert and oriented. In mild distress second to pain.. Eyes: Conjunctivae are normal. PERRL. EOMI. Head: Atraumatic. Nose: No congestion/rhinnorhea. Mouth/Throat: Mucous membranes are moist.  Oropharynx non-erythematous. Neck: No stridor.   Cardiovascular: Normal rate, regular rhythm. Grossly normal heart sounds.  Good peripheral circulation. Respiratory: Normal respiratory effort.  No retractions. Lungs CTAB. Gastrointestinal: Soft and nontender. No distention. No abdominal bruits. No CVA tenderness. Genitourinary: deferred Musculoskeletal: No lower extremity tenderness nor edema.  No joint effusions. The patient has severe tenderness to palpation throat the left anterior chest wall, moderate tenderness to the patient at the right anterior chest wall. Neurologic:  Normal speech and language. No gross focal neurologic deficits are appreciated.  Skin:  Skin is warm, dry and intact. No rash noted. Psychiatric: Mood and affect are normal. Speech and behavior are normal.  ____________________________________________   LABS (all labs ordered are listed, but only abnormal results are  displayed)  Labs Reviewed  CBC WITH DIFFERENTIAL/PLATELET - Abnormal; Notable for the following:    RDW 14.8 (*)    Platelets 132 (*)    All other components within normal limits  COMPREHENSIVE METABOLIC PANEL - Abnormal; Notable for the following:    AST 58 (*)    Alkaline Phosphatase 142 (*)    All other components within normal limits  FIBRIN DERIVATIVES D-DIMER ([facility] ONLY) - Abnormal; Notable for the following:    Fibrin derivatives D-dimer ([facility]) 672 (*)    All other components within normal limits  TROPONIN I   TROPONIN I   ____________________________________________  EKG  ED ECG REPORT I, Joanne Gavel, the attending physician, personally viewed and interpreted this ECG.   Date: 02/19/2015  EKG Time: 19:20  Rate: 90  Rhythm: normal sinus rhythm  Axis: normal  Intervals:none  ST&T Change: No acute ST elevation or depression.  ____________________________________________  RADIOLOGY  CXR  IMPRESSION: Cardiomegaly. No acute cardiopulmonary process.  CT angio chest FINDINGS: There are no filling defects within the pulmonary arteries to suggest pulmonary embolus.  The thoracic aorta is normal in caliber without dissection or aneurysm. Conventional branching pattern from the aortic arch. Heart appears upper normal in size, there is mediastinal lipomatosis. Coronary artery calcifications are seen. There is no mediastinal or hilar adenopathy.  Right apical ground-glass nodule measures 7 mm, unchanged from prior exams. No new or additional pulmonary nodule. Minimal fissural thickening involving the right major fissure versus intrapulmonary lymph node. No confluent airspace disease. Mild right lower lobe linear atelectasis. Trachea and mainstem bronchi are patent. No pulmonary edema.  The thyroid gland and esophagus are normal. There is no acute abnormality in the included upper abdomen. The included liver demonstrates lobular contours.  There are no acute or suspicious osseous abnormalities. Spinal stimulator is in place. Postsurgical change in the lower cervical spine, partially included.  Review of the MIP images confirms the above findings.  IMPRESSION: 1. No pulmonary embolus or acute intrathoracic process. 2. Coronary artery calcifications. 3. Right apical ground-glass nodule measures 7 mm, unchanged from prior exams. There is overall imaging stability over the course of 4 years, suggesting nonaggressive etiology such as  scarring.  ____________________________________________   PROCEDURES  Procedure(s) performed: None  Critical Care performed: No  ____________________________________________   INITIAL IMPRESSION / ASSESSMENT AND PLAN / ED COURSE  Pertinent labs & imaging results that were available during my care of the patient were reviewed by me and considered in my medical decision making (see chart for details).  Stephanie [name] is a 64 y.o. female with history of hyperlipidemia, diabetes, bipolar disorder, GERD, fibromyalgia who presents for evaluation of sudden onset constant stabbing left-sided chest pain. On exam, she is in mild distress secondary to pain. She has severe tenderness to palpation throughout the anterior chest wall. Suspect her pain may be muscular skeletal in nature however given risk factors for ACS, we'll pursue cardiac labs, EKG, chest x-ray. Given the pleuritic nature of her pain, we'll obtain d-dimer. We'll treat her pain. Reassess for disposition.  ----------------------------------------- 11:14 PM on 02/19/2015 -----------------------------------------  Patient with complete resolution of her pain, no recurrence of pain since arrival. First troponin negative. EKG reassuring. Doubt ACS however will obtain second troponin. D-dimer was elevated at 672, CT anterior chest negative for PE or acute aortic dissection. Normal CBC. CMP with mild nonspecific elevations of AST and alkaline phosphatase. Awaiting second troponin and then anticipate discharge with close PCP follow-up. Care transferred to Dr.  Brown at this time. ____________________________________________   FINAL CLINICAL IMPRESSION(S) / ED DIAGNOSES  Final diagnoses:  Chest pain, unspecified chest pain type      Joanne Gavel, MD 02/19/15 2338

## 2015-02-20 ENCOUNTER — Other Ambulatory Visit: Payer: Self-pay | Admitting: Family Medicine

## 2015-02-20 LAB — TROPONIN I

## 2015-02-20 NOTE — ED Provider Notes (Signed)
I assumed care of the patient at 11:00 PM from Dr. Madaline Savage repeat troponin negative patient advised to follow-up with primary care provider.  Gregor Hams, MD 02/20/15 [phone]

## 2015-02-25 ENCOUNTER — Ambulatory Visit (INDEPENDENT_AMBULATORY_CARE_PROVIDER_SITE_OTHER): Payer: Commercial Managed Care - HMO | Admitting: Family Medicine

## 2015-02-25 ENCOUNTER — Encounter: Payer: Self-pay | Admitting: Family Medicine

## 2015-02-25 VITALS — BP 130/80 | HR 112 | Temp 98.6°F | Wt 215.0 lb

## 2015-02-25 DIAGNOSIS — R079 Chest pain, unspecified: Secondary | ICD-10-CM | POA: Insufficient documentation

## 2015-02-25 DIAGNOSIS — R0789 Other chest pain: Secondary | ICD-10-CM

## 2015-02-25 NOTE — Progress Notes (Signed)
Subjective:   Patient ID: Stephanie [name], female    DOB: 02-13-1951, 64 y.o.   MRN: [id number]  Stephanie [name] is a pleasant 64 y.o. year old female with h/o HLD, DM, bipolar disorder, fibromyalgia who presents to clinic today with ED follow-up  on 02/25/2015  HPI:  Was seen at [facility] on 02/19/15 for acute onset of stabbing left chest pain. Notes and studies reviewed.  Pain worse with deep inspiration and palpation, non extertional.  She was however complaining of DOE and nausea.  NTG did not alleviate pain en route via EMS.  CEs negative, EKG reassuring.  D dimer was elevated, CT angio neg for PE or acute aortic dissection.  Advised likely chest wall pain- MSK in nature and to follow up with me here today.  Pain has resolved.  Now only feels it if she presses really hard on the center of her chest.  Recently started gabapentin from Dr. Precious Reel and feels that is helping her symptoms too. Dg Chest 2 View  02/19/2015   CLINICAL DATA:  Patient with sudden onset chest pain.  EXAM: CHEST  2 VIEW  COMPARISON:  Chest radiograph 04/21/2014.  FINDINGS: Anterior cervical spinal fusion hardware. Stable enlarged cardiac and mediastinal contours. No consolidative pulmonary opacities. No pleural effusion or pneumothorax. Mid thoracic spine degenerative changes. Spinal stimulator in place.  IMPRESSION: Cardiomegaly.  No acute cardiopulmonary process.   Electronically Signed   By: Lovey Newcomer M.D.   On: 02/19/2015 18:43   Ct Angio Chest Pe W/cm &/or Wo Cm  02/19/2015   CLINICAL DATA:  Sudden onset of chest pain earlier today. Pleuritic chest pain, right side radiating to the neck and right arm.  EXAM: CT ANGIOGRAPHY CHEST WITH CONTRAST  TECHNIQUE: Multidetector CT imaging of the chest was performed using the standard protocol during bolus administration of intravenous contrast. Multiplanar CT image reconstructions and MIPs were obtained to evaluate the vascular anatomy.  CONTRAST:  124m OMNIPAQUE  IOHEXOL 350 MG/ML SOLN  COMPARISON:  Radiographs earlier this day. Chest CT 06/24/2014, multiple prior chest CT  FINDINGS: There are no filling defects within the pulmonary arteries to suggest pulmonary embolus.  The thoracic aorta is normal in caliber without dissection or aneurysm. Conventional branching pattern from the aortic arch. Heart appears upper normal in size, there is mediastinal lipomatosis. Coronary artery calcifications are seen. There is no mediastinal or hilar adenopathy.  Right apical ground-glass nodule measures 7 mm, unchanged from prior exams. No new or additional pulmonary nodule. Minimal fissural thickening involving the right major fissure versus intrapulmonary lymph node. No confluent airspace disease. Mild right lower lobe linear atelectasis. Trachea and mainstem bronchi are patent. No pulmonary edema.  The thyroid gland and esophagus are normal. There is no acute abnormality in the included upper abdomen. The included liver demonstrates lobular contours.  There are no acute or suspicious osseous abnormalities. Spinal stimulator is in place. Postsurgical change in the lower cervical spine, partially included.  Review of the MIP images confirms the above findings.  IMPRESSION: 1. No pulmonary embolus or acute intrathoracic process. 2. Coronary artery calcifications. 3. Right apical ground-glass nodule measures 7 mm, unchanged from prior exams. There is overall imaging stability over the course of 4 years, suggesting nonaggressive etiology such as scarring.   Electronically Signed   By: MJeb LeveringM.D.   On: 02/19/2015 21:54   Current Outpatient Prescriptions on File Prior to Visit  Medication Sig Dispense Refill  . ABILIFY 30 MG tablet TAKE  1 TABLET BY MOUTH EVERY MORNING 30 tablet 0  . clonazePAM (KLONOPIN) 0.5 MG tablet TAKE 1 TABLET BY MOUTH TWICE A DAY 60 tablet 0  . diazepam (VALIUM) 5 MG tablet TAKE 1 TABLET BY MOUTH TWICE A DAY 60 tablet 0  . docusate sodium (COLACE) 100  MG capsule TAKE 1 CAPSULE BY MOUTH 2 TIMES DAILY AS NEEDED FOR CONSTIPATION 60 capsule 3  . lamoTRIgine (LAMICTAL) 100 MG tablet Take 3 tablets by mouth once daily    . meloxicam (MOBIC) 7.5 MG tablet Take 1 tablet (7.5 mg total) by mouth daily. 30 tablet 0  . metFORMIN (GLUCOPHAGE) 1000 MG tablet TAKE 1 TABLET (1,000 MG TOTAL) BY MOUTH 2 (TWO) TIMES DAILY WITH A MEAL. 180 tablet 1  . methscopolamine (PAMINE) 2.5 MG TABS tablet Take 1 tablet by mouth 5 (five) times daily.  2  . mirtazapine (REMERON) 15 MG tablet TAKE 1 TABLET BY MOUTH AT BEDTIME 34 tablet 2  . omeprazole (PRILOSEC) 40 MG capsule TAKE ONE CAPSULE BY MOUTH EVERY DAY 90 capsule 3  . promethazine (PHENERGAN) 25 MG tablet TAKE 1/2 TO 1 TABLET BY MOUTH EVERY 8 HOURS AS NEEDED FOR NAUSEA 30 tablet 2  . rOPINIRole (REQUIP) 3 MG tablet TAKE 1 TABLET BY MOUTH AT BEDTIME 90 tablet 0  . simvastatin (ZOCOR) 10 MG tablet TAKE 1 TABLET BY MOUTH AT BEDTIME 90 tablet 1  . SYMBICORT 160-4.5 MCG/ACT inhaler INHALE 2 PUFFS INTO THE LUNGS 2 (TWO) TIMES DAILY. 10.2 Inhaler 0  . tolterodine (DETROL LA) 2 MG 24 hr capsule Take 2 mg by mouth daily.  0  . traMADol (ULTRAM) 50 MG tablet Take 2 tablets by mouth 2 (two) times daily.  0  . zolpidem (AMBIEN) 5 MG tablet TAKE 1 TABLET BY MOUTH AT BEDTIME AS NEEDED FOR SLEEP 30 tablet 0   No current facility-administered medications on file prior to visit.    Allergies  Allergen Reactions  . Doxycycline Other (See Comments)    Face red and swollen; no difficulty breathing    Past Medical History  Diagnosis Date  . Adenomatous colon polyp   . Insomnia   . RLS (restless legs syndrome)   . Colon polyp   . Incontinence   . Hepatitis B, chronic   . H/O: rheumatic fever   . Hyperlipidemia   . Unspecified disorders of nervous system   . Bipolar affective disorder     h/o  . H/O: CVA (cardiovascular accident)     TIA  . Fatty liver 04/09/08    found in abd CT  . Shortness of breath   . Obstructive  sleep apnea     not using CPAP, last sleep study 2.5 years ago, per pt doctor is aware  . Blood transfusion 2000  . GERD (gastroesophageal reflux disease)   . Arthritis   . Aortic dissection     Type 1  . Asthma   . Anxiety   . Depression   . Stroke     TIA- 2002  . Ulcer   . Diabetes mellitus without complication   . Anemia   . Blood transfusion without reported diagnosis   . Sleep apnea   . Platelets decreased     Past Surgical History  Procedure Laterality Date  . Back surgery      x 45  . Cholecystectomy    . Rotator cuff repair      left  . Abdominal hysterectomy      total  .  Lumbar fusion  10/09  . Hemorroidectomy      and colon polyp removed  . Abdominal exploration surgery    . Posterior cervical fusion/foraminotomy    . Eye surgery      bilat  . Lumbar wound debridement  05/09/2011    Procedure: LUMBAR WOUND DEBRIDEMENT;  Surgeon: Dahlia Bailiff;  Location: [facility];  Service: Orthopedics;  Laterality: N/A;  IRRIGATION AND DEBRIDEMENT SPINAL WOUND  . Axillary artery cannulation via 8-mm hemashield graft, median sternotomy, extracorporeal circulation with deep hypothermic circulatory arrest, repair of  aortic dessection  01/23/2009    Dr Arlyce Dice  . Cataract      both eyes  . Colonoscopy    . Liver biopsy  04/10/2012    Procedure: LIVER BIOPSY;  Surgeon: Inda Castle, MD;  Location: [facility];  Service: Endoscopy;  Laterality: N/A;  ultrasound to mark order for abd limited/liver to be marked to be sent by [email or web address]     Family History  Problem Relation Age of Onset  . Colon cancer Neg Hx   . Esophageal cancer Neg Hx   . Rectal cancer Neg Hx   . Breast cancer Mother   . Lung cancer Mother   . Stomach cancer Father   . Diabetes      4 aunts, and 1 uncle    Social History   Social History  . Marital Status: Single    Spouse Name: N/A  . Number of Children: 1  . Years of Education: N/A   Occupational History  . Disabled    Social History  Main Topics  . Smoking status: Former Smoker -- 0.50 packs/day for 50 years    Types: Cigarettes    Quit date: 04/06/2013  . Smokeless tobacco: Never Used  . Alcohol Use: No  . Drug Use: No  . Sexual Activity: Not on file   Other Topics Concern  . Not on file   Social History Narrative   1 son, 67+ y/o   Daily caffeine use: 2 cups daily   Does not get regular exercise   Disability due to bipolar      Has a living will- she is a DNR (she has form at home per pt).   The PMH, PSH, Social History, Family History, Medications, and allergies have been reviewed in [facility], and have been updated if relevant.  Review of Systems  Constitutional: Negative.   Respiratory: Negative.   Cardiovascular: Negative.   Gastrointestinal: Negative.   Genitourinary: Negative.   Musculoskeletal: Negative.   Skin: Negative.   Neurological: Negative.   Hematological: Negative.   Psychiatric/Behavioral: Negative.   All other systems reviewed and are negative.      Objective:    BP 130/80 mmHg  Pulse 112  Temp(Src) 98.6 F (37 C) (Tympanic)  Wt 215 lb (97.523 kg)  SpO2 96%   Physical Exam  Constitutional: She is oriented to person, place, and time. She appears well-developed and well-nourished. No distress.  HENT:  Head: Normocephalic and atraumatic.  Eyes: Conjunctivae are normal.  Neck: Normal range of motion.  Cardiovascular: Normal rate, regular rhythm and normal heart sounds.   Pulmonary/Chest: Effort normal and breath sounds normal. She has no wheezes. She has no rales. She exhibits tenderness.  Musculoskeletal: Normal range of motion. She exhibits no edema.  Neurological: She is alert and oriented to person, place, and time. No cranial nerve deficit.  Skin: Skin is warm and dry.  Psychiatric: She has a normal mood and affect. Her  behavior is normal. Judgment and thought content normal.  Nursing note and vitals reviewed.         Assessment & Plan:   Other chest pain No  Follow-up on file.

## 2015-02-25 NOTE — Assessment & Plan Note (Signed)
New- resolving. ?panic attack with chest wall pain. No further work up at treatment at this time. Call or return to clinic prn if these symptoms worsen or fail to improve as anticipated. The patient indicates understanding of these issues and agrees with the plan.

## 2015-02-25 NOTE — Progress Notes (Signed)
Pre visit review using our clinic review tool, if applicable. No additional management support is needed unless otherwise documented below in the visit note. 

## 2015-03-02 NOTE — Telephone Encounter (Signed)
Patient saw [facility] Rheum Dr Precious Reel. He wanted to refer her for PT but their office is not in [place]. Will you please put a Referral in for PT for Back Pain, Hip Pain and Neck Pain. Patient wants to go to [facility] in [place] b/c they are in the [place].

## 2015-03-03 NOTE — Telephone Encounter (Signed)
Attempted to contact pt;line busy

## 2015-03-03 NOTE — Telephone Encounter (Signed)
Yes ok to for her to take gabapentin.

## 2015-03-03 NOTE — Telephone Encounter (Signed)
Referral placed.

## 2015-03-04 ENCOUNTER — Other Ambulatory Visit: Payer: Self-pay | Admitting: Family Medicine

## 2015-03-04 NOTE — Telephone Encounter (Signed)
Last office visit 02/25/2015.  Ok to refill?

## 2015-03-04 NOTE — Telephone Encounter (Signed)
Spoke to pt and advised per Dr Deborra Medina; pt verbally expressed understanding.

## 2015-03-04 NOTE — Telephone Encounter (Signed)
Tramadol called into [facility].

## 2015-03-18 ENCOUNTER — Other Ambulatory Visit: Payer: Self-pay | Admitting: Family Medicine

## 2015-03-19 NOTE — Telephone Encounter (Signed)
Last f/u 08/2014-CPE

## 2015-03-19 NOTE — Telephone Encounter (Signed)
Rx called in to requested pharmacy 

## 2015-03-21 ENCOUNTER — Emergency Department: Payer: Commercial Managed Care - HMO

## 2015-03-21 ENCOUNTER — Emergency Department
Admission: EM | Admit: 2015-03-21 | Discharge: 2015-03-21 | Disposition: A | Discharge status: Home / Self Care | Payer: Commercial Managed Care - HMO | Attending: Emergency Medicine | Admitting: Emergency Medicine

## 2015-03-21 DIAGNOSIS — Z791 Long term (current) use of non-steroidal anti-inflammatories (NSAID): Secondary | ICD-10-CM | POA: Diagnosis not present

## 2015-03-21 DIAGNOSIS — S0990XA Unspecified injury of head, initial encounter: Secondary | ICD-10-CM | POA: Diagnosis present

## 2015-03-21 DIAGNOSIS — S199XXA Unspecified injury of neck, initial encounter: Secondary | ICD-10-CM | POA: Diagnosis not present

## 2015-03-21 DIAGNOSIS — Y998 Other external cause status: Secondary | ICD-10-CM | POA: Insufficient documentation

## 2015-03-21 DIAGNOSIS — T148XXA Other injury of unspecified body region, initial encounter: Secondary | ICD-10-CM

## 2015-03-21 DIAGNOSIS — S0083XA Contusion of other part of head, initial encounter: Secondary | ICD-10-CM | POA: Insufficient documentation

## 2015-03-21 DIAGNOSIS — I10 Essential (primary) hypertension: Secondary | ICD-10-CM | POA: Insufficient documentation

## 2015-03-21 DIAGNOSIS — S3992XA Unspecified injury of lower back, initial encounter: Secondary | ICD-10-CM | POA: Diagnosis not present

## 2015-03-21 DIAGNOSIS — Z87891 Personal history of nicotine dependence: Secondary | ICD-10-CM | POA: Insufficient documentation

## 2015-03-21 DIAGNOSIS — Z79899 Other long term (current) drug therapy: Secondary | ICD-10-CM | POA: Insufficient documentation

## 2015-03-21 DIAGNOSIS — Y9289 Other specified places as the place of occurrence of the external cause: Secondary | ICD-10-CM | POA: Insufficient documentation

## 2015-03-21 DIAGNOSIS — W07XXXA Fall from chair, initial encounter: Secondary | ICD-10-CM | POA: Insufficient documentation

## 2015-03-21 DIAGNOSIS — E118 Type 2 diabetes mellitus with unspecified complications: Secondary | ICD-10-CM | POA: Insufficient documentation

## 2015-03-21 DIAGNOSIS — Y9389 Activity, other specified: Secondary | ICD-10-CM | POA: Insufficient documentation

## 2015-03-21 DIAGNOSIS — W19XXXA Unspecified fall, initial encounter: Secondary | ICD-10-CM

## 2015-03-21 MED ORDER — IBUPROFEN 600 MG PO TABS
600.0000 mg | ORAL_TABLET | Freq: Once | ORAL | Status: AC
  Administered 2015-03-21: 600 mg via ORAL

## 2015-03-21 MED ORDER — IBUPROFEN 600 MG PO TABS
ORAL_TABLET | ORAL | Status: AC
  Administered 2015-03-21: 600 mg via ORAL
  Filled 2015-03-21: qty 1

## 2015-03-21 NOTE — ED Provider Notes (Signed)
[facility] Emergency Department Provider Note    ____________________________________________  Time seen: On EMS arrival  I have reviewed the triage vital signs and the nursing notes.   HISTORY  Chief Complaint No chief complaint on file.   History limited by: Not Limited   HPI Stephanie [name] is a 64 y.o. female who presents to the emergency department today with concerns for head, neck and back pain after a fall. The patient was sitting in a chair when it fell over backwards. She states she did hit her head. She denies loss of consciousness however states she saw stars. Since then she has had constant head and neck pain. In addition she has had full spine pain. She has had multiple spinal surgeries. She states however that she does not have constant back pain. She did develop some nausea during transport. Denies being on any blood thinners.    Past Medical History  Diagnosis Date  . Adenomatous colon polyp   . Insomnia   . RLS (restless legs syndrome)   . Colon polyp   . Incontinence   . Hepatitis B, chronic   . H/O: rheumatic fever   . Hyperlipidemia   . Unspecified disorders of nervous system   . Bipolar affective disorder     h/o  . H/O: CVA (cardiovascular accident)     TIA  . Fatty liver 04/09/08    found in abd CT  . Shortness of breath   . Obstructive sleep apnea     not using CPAP, last sleep study 2.5 years ago, per pt doctor is aware  . Blood transfusion 2000  . GERD (gastroesophageal reflux disease)   . Arthritis   . Aortic dissection     Type 1  . Asthma   . Anxiety   . Depression   . Stroke     TIA- 2002  . Ulcer   . Diabetes mellitus without complication   . Anemia   . Blood transfusion without reported diagnosis   . Sleep apnea   . Platelets decreased     Patient Active Problem List   Diagnosis Date Noted  . Chest pain 02/25/2015  . Myalgia and myositis 02/11/2015  . Yeast infection 12/15/2014  . Intertrigo  12/15/2014  . Thrombocytopenia ([place]) 08/19/2014  . IGTN (ingrowing toe nail) 08/19/2014  . Abdominal pain, chronic, epigastric 06/12/2014  . Elevated blood pressure 03/27/2014  . Liver cirrhosis secondary to NASH 02/27/2014  . Abnormal chest CT 02/21/2014  . Chest wall pain 12/29/2013  . Dizziness and giddiness 10/30/2013  . DOE (dyspnea on exertion) 10/30/2013  . Nausea alone 10/30/2013  . Diabetes mellitus type 2, controlled, with complications ([place]) 63/06/6008  . DNR (do not resuscitate) 07/29/2013  . Encounter for routine gynecological examination 07/29/2013  . Dyspnea 07/10/2013  . Lung nodule 07/10/2013  . Right foot pain 05/20/2013  . Varicose veins of lower extremities with other complications 93/23/5573  . GERD (gastroesophageal reflux disease) 10/09/2012  . Cirrhosis of liver ([place]) 02/02/2012  . Anxiety 07/11/2011  . Aortic dissection ([place])   . BACK PAIN, LUMBAR, CHRONIC 11/19/2009  . UNS ADVRS EFF OTH RX MEDICINAL&BIOLOGICAL SBSTNC 12/16/2008  . UNSPECIFIED VITAMIN D DEFICIENCY 09/26/2008  . TOBACCO ABUSE 06/19/2008  . RESTLESS LEGS SYNDROME 04/27/2007  . INSOMNIA 04/27/2007  . Benign neoplasm of colon 04/04/2007  . HLD (hyperlipidemia) 03/08/2007  . OSA (obstructive sleep apnea) 03/08/2007  . Essential hypertension 03/08/2007  . Asthma 03/08/2007  . BIPOLAR AFFECTIVE DISORDER, HX  OF 03/08/2007    Past Surgical History  Procedure Laterality Date  . Back surgery      x 45  . Cholecystectomy    . Rotator cuff repair      left  . Abdominal hysterectomy      total  . Lumbar fusion  10/09  . Hemorroidectomy      and colon polyp removed  . Abdominal exploration surgery    . Posterior cervical fusion/foraminotomy    . Eye surgery      bilat  . Lumbar wound debridement  05/09/2011    Procedure: LUMBAR WOUND DEBRIDEMENT;  Surgeon: Dahlia Bailiff;  Location: [facility];  Service: Orthopedics;  Laterality: N/A;  IRRIGATION AND DEBRIDEMENT SPINAL WOUND  . Axillary  artery cannulation via 8-mm hemashield graft, median sternotomy, extracorporeal circulation with deep hypothermic circulatory arrest, repair of  aortic dessection  01/23/2009    Dr Arlyce Dice  . Cataract      both eyes  . Colonoscopy    . Liver biopsy  04/10/2012    Procedure: LIVER BIOPSY;  Surgeon: Inda Castle, MD;  Location: [facility];  Service: Endoscopy;  Laterality: N/A;  ultrasound to mark order for abd limited/liver to be marked to be sent by [email or web address]     Current Outpatient Rx  Name  Route  Sig  Dispense  Refill  . ABILIFY 30 MG tablet      TAKE 1 TABLET BY MOUTH EVERY MORNING   30 tablet   0   . clonazePAM (KLONOPIN) 0.5 MG tablet      TAKE 1 TABLET BY MOUTH TWICE A DAY   60 tablet   0     Not to exceed 5 additional fills before 06/13/2015 ...   . diazepam (VALIUM) 5 MG tablet      TAKE 1 TABLET BY MOUTH TWICE A DAY   60 tablet   0     Not to exceed 5 additional fills before 06/13/2015 ...   . docusate sodium (COLACE) 100 MG capsule      TAKE 1 CAPSULE BY MOUTH 2 TIMES DAILY AS NEEDED FOR CONSTIPATION   60 capsule   3   . gabapentin (NEURONTIN) 100 MG capsule   Oral   Take 100 mg by mouth 3 (three) times daily.          Marland Kitchen lamoTRIgine (LAMICTAL) 100 MG tablet      Take 3 tablets by mouth once daily         . meloxicam (MOBIC) 7.5 MG tablet   Oral   Take 1 tablet (7.5 mg total) by mouth daily.   30 tablet   0   . metFORMIN (GLUCOPHAGE) 1000 MG tablet      TAKE 1 TABLET (1,000 MG TOTAL) BY MOUTH 2 (TWO) TIMES DAILY WITH A MEAL.   180 tablet   1   . methscopolamine (PAMINE) 2.5 MG TABS tablet   Oral   Take 1 tablet by mouth 5 (five) times daily.      2   . mirtazapine (REMERON) 15 MG tablet      TAKE 1 TABLET BY MOUTH AT BEDTIME   34 tablet   2   . omeprazole (PRILOSEC) 40 MG capsule      TAKE ONE CAPSULE BY MOUTH EVERY DAY   90 capsule   3   . promethazine (PHENERGAN) 25 MG tablet      TAKE 1/2 TO 1 TABLET BY MOUTH EVERY 8  HOURS AS NEEDED  FOR NAUSEA   30 tablet   2   . rOPINIRole (REQUIP) 3 MG tablet      TAKE 1 TABLET BY MOUTH AT BEDTIME   90 tablet   0     NEEDS REFILLS   . simvastatin (ZOCOR) 10 MG tablet      TAKE 1 TABLET BY MOUTH AT BEDTIME   90 tablet   1     NEEDS REFILLS   . SYMBICORT 160-4.5 MCG/ACT inhaler      INHALE 2 PUFFS INTO THE LUNGS 2 (TWO) TIMES DAILY.   10.2 Inhaler   0     Patient must schedule office visit before future r ...   . tolterodine (DETROL LA) 2 MG 24 hr capsule      TAKE ONE CAPSULE BY MOUTH DAILY   90 capsule   0   . traMADol (ULTRAM) 50 MG tablet      TAKE 2 TABLETS BY MOUTH EVERY MORNING AND 2 TABLETS BY MOUTH EVERY EVENING   120 tablet   0     Not to exceed 5 additional fills before 07/18/2015 ...   . zolpidem (AMBIEN) 5 MG tablet      TAKE 1 TABLET AT BEDTIME   30 tablet   0     Not to exceed 5 additional fills before 08/16/2015 ...     Allergies Doxycycline  Family History  Problem Relation Age of Onset  . Colon cancer Neg Hx   . Esophageal cancer Neg Hx   . Rectal cancer Neg Hx   . Breast cancer Mother   . Lung cancer Mother   . Stomach cancer Father   . Diabetes      4 aunts, and 1 uncle    Social History Social History  Substance Use Topics  . Smoking status: Former Smoker -- 0.50 packs/day for 50 years    Types: Cigarettes    Quit date: 04/06/2013  . Smokeless tobacco: Never Used  . Alcohol Use: No    Review of Systems  Constitutional: Negative for fever. Cardiovascular: Negative for chest pain. Respiratory: Negative for shortness of breath. Gastrointestinal: Negative for abdominal pain, vomiting and diarrhea. Genitourinary: Negative for dysuria. Musculoskeletal: Positive for back pain Skin: Negative for rash. Neurological: Positive for headache   10-point ROS otherwise negative.  ____________________________________________   PHYSICAL EXAM:  VITAL SIGNS:    97.8 F (36.6 C)  100  16  116/68  mmHg  98 %     Constitutional: Alert and oriented. On backboard and in c-collar Eyes: Conjunctivae are normal. PERRL. Normal extraocular movements. ENT   Head: Normocephalic and atraumatic.   Nose: No congestion/rhinnorhea.   Mouth/Throat: Mucous membranes are moist.   Neck: No stridor. In c-collar Hematological/Lymphatic/Immunilogical: No cervical lymphadenopathy. Cardiovascular: Normal rate, regular rhythm.  No murmurs, rubs, or gallops. Respiratory: Normal respiratory effort without tachypnea nor retractions. Breath sounds are clear and equal bilaterally. No wheezes/rales/rhonchi. Gastrointestinal: Soft and nontender. No distention.  Genitourinary: Deferred Musculoskeletal: Normal range of motion in all extremities. No joint effusions.  No lower extremity tenderness nor edema. Tender to palpation along the spine. Neurologic:  Normal speech and language. No gross focal neurologic deficits are appreciated. Speech is normal.  Skin:  Skin is warm, dry and intact. No rash noted. Psychiatric: Mood and affect are normal. Speech and behavior are normal. Patient exhibits appropriate insight and judgment.  ____________________________________________    LABS (pertinent positives/negatives)  None  ____________________________________________   EKG  None  ____________________________________________  RADIOLOGY  CT head/cervical spine  IMPRESSION: 1. Negative noncontrast head CT. 2. No fracture or static subluxation of the cervical spine. 3. Post C5-C7 ACDF and interbody fusion without evidence of hardware failure or loosening.  Lumbar Spine IMPRESSION: 1. No acute radiographic abnormality of the lumbar spine. 2. Postoperative changes redemonstrated, as above, with stable postoperative appearance of the spine. There does appear to be some mild lucency adjacent to the spinal hardware, which could suggest a small amount of hardware loosening.  Thoracic  Spine IMPRESSION: 1. No acute radiographic abnormality of the thoracic spine. ____________________________________________   PROCEDURES  Procedure(s) performed: None  Critical Care performed: No  ____________________________________________   INITIAL IMPRESSION / ASSESSMENT AND PLAN / ED COURSE  Pertinent labs & imaging results that were available during my care of the patient were reviewed by me and considered in my medical decision making (see chart for details).  Patient presented to the emergency department today with concerns for head, neck and spine pain after a fall. Imaging is negative. Neurologically intact. Will discharge.  ____________________________________________   FINAL CLINICAL IMPRESSION(S) / ED DIAGNOSES  Final diagnoses:  Fall, initial encounter  Contusion     Nance Pear, MD 03/21/15 1243

## 2015-03-21 NOTE — ED Notes (Signed)
BIB EMS pt was at a yard sale at the fire [employer] and fell backwards in her chair. Pt hit the back of her head on on cement. No laceration per EMS. Pt reports pain to back of head neck and down spine. Pt in C collar

## 2015-03-21 NOTE — Discharge Instructions (Signed)
Please seek medical attention for any high fevers, chest pain, shortness of breath, change in behavior, persistent vomiting, bloody stool or any other new or concerning symptoms.  Fall Prevention in the Home  Falls can cause injuries and can affect people from all age groups. There are many simple things that you can do to make your home safe and to help prevent falls. WHAT CAN I DO ON THE OUTSIDE OF MY HOME?  Regularly repair the edges of walkways and driveways and fix any cracks.  Remove high doorway thresholds.  Trim any shrubbery on the main path into your home.  Use bright outdoor lighting.  Clear walkways of debris and clutter, including tools and rocks.  Regularly check that handrails are securely fastened and in good repair. Both sides of any steps should have handrails.  Install guardrails along the edges of any raised decks or porches.  Have leaves, snow, and ice cleared regularly.  Use sand or salt on walkways during winter months.  In the garage, clean up any spills right away, including grease or oil spills. WHAT CAN I DO IN THE BATHROOM?  Use night lights.  Install grab bars by the toilet and in the tub and shower. Do not use towel bars as grab bars.  Use non-skid mats or decals on the floor of the tub or shower.  If you need to sit down while you are in the shower, use a plastic, non-slip stool.Marland Kitchen  Keep the floor dry. Immediately clean up any water that spills on the floor.  Remove soap buildup in the tub or shower on a regular basis.  Attach bath mats securely with double-sided non-slip rug tape.  Remove throw rugs and other tripping hazards from the floor. WHAT CAN I DO IN THE BEDROOM?  Use night lights.  Make sure that a bedside light is easy to reach.  Do not use oversized bedding that drapes onto the floor.  Have a firm chair that has side arms to use for getting dressed.  Remove throw rugs and other tripping hazards from the floor. WHAT CAN I  DO IN THE KITCHEN?   Clean up any spills right away.  Avoid walking on wet floors.  Place frequently used items in easy-to-reach places.  If you need to reach for something above you, use a sturdy step stool that has a grab bar.  Keep electrical cables out of the way.  Do not use floor polish or wax that makes floors slippery. If you have to use wax, make sure that it is non-skid floor wax.  Remove throw rugs and other tripping hazards from the floor. WHAT CAN I DO IN THE STAIRWAYS?  Do not leave any items on the stairs.  Make sure that there are handrails on both sides of the stairs. Fix handrails that are broken or loose. Make sure that handrails are as long as the stairways.  Check any carpeting to make sure that it is firmly attached to the stairs. Fix any carpet that is loose or worn.  Avoid having throw rugs at the top or bottom of stairways, or secure the rugs with carpet tape to prevent them from moving.  Make sure that you have a light switch at the top of the stairs and the bottom of the stairs. If you do not have them, have them installed. WHAT ARE SOME OTHER FALL PREVENTION TIPS?  Wear closed-toe shoes that fit well and support your feet. Wear shoes that have rubber soles or low  heels.  When you use a stepladder, make sure that it is completely opened and that the sides are firmly locked. Have someone hold the ladder while you are using it. Do not climb a closed stepladder.  Add color or contrast paint or tape to grab bars and handrails in your home. Place contrasting color strips on the first and last steps.  Use mobility aids as needed, such as canes, walkers, scooters, and crutches.  Turn on lights if it is dark. Replace any light bulbs that burn out.  Set up furniture so that there are clear paths. Keep the furniture in the same spot.  Fix any uneven floor surfaces.  Choose a carpet design that does not hide the edge of steps of a stairway.  Be aware of any  and all pets.  Review your medicines with your healthcare provider. Some medicines can cause dizziness or changes in blood pressure, which increase your risk of falling. Talk with your health care provider about other ways that you can decrease your risk of falls. This may include working with a physical therapist or trainer to improve your strength, balance, and endurance.   This information is not intended to replace advice given to you by your health care provider. Make sure you discuss any questions you have with your health care provider.   Document Released: 05/13/2002 Document Revised: 10/07/2014 Document Reviewed: 06/27/2014 Elsevier Interactive Patient Education [employer].

## 2015-03-30 ENCOUNTER — Ambulatory Visit (INDEPENDENT_AMBULATORY_CARE_PROVIDER_SITE_OTHER): Payer: Commercial Managed Care - HMO | Admitting: Family Medicine

## 2015-03-30 ENCOUNTER — Encounter: Payer: Self-pay | Admitting: Family Medicine

## 2015-03-30 VITALS — BP 144/82 | HR 92 | Temp 97.7°F | Wt 218.5 lb

## 2015-03-30 DIAGNOSIS — W07XXXD Fall from chair, subsequent encounter: Secondary | ICD-10-CM

## 2015-03-30 DIAGNOSIS — S0083XD Contusion of other part of head, subsequent encounter: Secondary | ICD-10-CM | POA: Diagnosis not present

## 2015-03-30 DIAGNOSIS — W07XXXA Fall from chair, initial encounter: Secondary | ICD-10-CM | POA: Insufficient documentation

## 2015-03-30 NOTE — Progress Notes (Signed)
Subjective:   Patient ID: NASREEN GOEDECKE, female    DOB: [birth year], 64 y.o.   MRN: [id number]  NYESHIA MYSLIWIEC is a pleasant 64 y.o. year old female who presents to clinic today with Hospitalization Follow-up  on 03/30/2015  HPI:  Here for ER follow up.  Notes reviewed- was seen at [facility] ED on 03/21/15 after she fell backwards from a chair and hit her head.  No LOC but she did "see stars."  She also was complaining of neck and back pain.    Head CT was negative.  Lumbar and thoracic spine films also reassuring.  Neurologically intact and therefore advised to follow up with me here today.  Feeling ok.  Tailbone is sore and still having some headaches, otherwise has no complaints today.  Dg Thoracic Spine 2 View  03/21/2015  CLINICAL DATA:  64 year old female with history of trauma from a fall today landing onto her back with injury to the head on cement. Chronic back pain. EXAM: THORACIC SPINE 2 VIEWS COMPARISON:  Prior chest radiograph 02/19/2015. FINDINGS: Two views of the thoracic spine demonstrate no acute displaced fracture or acute compression type fracture. Alignment is anatomic. Mild multilevel degenerative disc disease. Spinal cord stimulator in place adjacent to the lower thoracic spine. Orthopedic fixation hardware in the lower cervical spine incidentally noted. Visualized portions of the bony thorax appear intact. IMPRESSION: 1. No acute radiographic abnormality of the thoracic spine. Electronically Signed   By: Vinnie Langton M.D.   On: 03/21/2015 10:54   Dg Lumbar Spine 2-3 Views  03/21/2015  CLINICAL DATA:  64 year old female with history of trauma from a fall today onto cement. Chronic back pain, acutely worsened after the fall. EXAM: LUMBAR SPINE - 2-3 VIEW COMPARISON:  Multiple priors, most recently lumbar spine radiograph 02/24/2015. FINDINGS: Three views of the lumbar spine again demonstrate postoperative changes of PLIF at L3-L4. Interbody grafts are noted at  L3-L4, L4-L5 and L5-S1. There is 5 mm of anterolisthesis of L3 upon L4 (unchanged). Alignment is otherwise anatomic. Lucency adjacent to the screws at both levels is noted, which may suggest some mild hardware loosening. No fracture of the hardware identified. No acute displaced fracture or compression type fracture in the lumbar spine. Multilevel degenerative disc disease and mild multilevel facet arthropathy again noted. IMPRESSION: 1. No acute radiographic abnormality of the lumbar spine. 2. Postoperative changes redemonstrated, as above, with stable postoperative appearance of the spine. There does appear to be some mild lucency adjacent to the spinal hardware, which could suggest a small amount of hardware loosening. Electronically Signed   By: Vinnie Langton M.D.   On: 03/21/2015 10:58   Ct Head Wo Contrast  03/21/2015  CLINICAL DATA:  Post fall hitting back of head now with head and neck pain. EXAM: CT HEAD WITHOUT CONTRAST CT CERVICAL SPINE WITHOUT CONTRAST TECHNIQUE: Multidetector CT imaging of the head and cervical spine was performed following the standard protocol without intravenous contrast. Multiplanar CT image reconstructions of the cervical spine were also generated. COMPARISON:  Neck CT - 07/05/2013 FINDINGS: CT HEAD FINDINGS Regional soft tissues appear normal. No radiopaque foreign body. No displaced calvarial fracture. Gray-white differentiation is maintained. No CT evidence acute large territory infarct. No intraparenchymal or extra-axial mass or hemorrhage. Normal size and configuration of the ventricles and basilar cisterns. No midline shift. Intracranial atherosclerosis. There is scattered mucosal thickening involving the bilateral anterior and posterior ethmoidal air cells. Remaining paranasal sinuses mastoid air cells remain normally aerated. Post bilateral  cataract surgery. CT CERVICAL SPINE FINDINGS C1 to the superior endplate of T1 is imaged though evaluation of the mid in caudal  aspects of the cervical spine is degraded secondary to beam hardening artifact due to patient body habitus. Normal alignment of the cervical spine. No anterolisthesis or retrolisthesis. The bilateral facets are normally aligned. The dens is normally positioned between the lateral masses of C1. Normal atlantodental and atlantoaxial articulations. Post C5-C7 ACDF and interbody fusion without evidence of hardware failure or loosening. No fracture or static subluxation of the cervical spine. Cervical vertebral body heights are preserved. Prevertebral soft tissues are normal. Remaining intervertebral disc space heights appear preserved. Regional soft tissues appear normal. Normal noncontrast appearance of the thyroid gland. IMPRESSION: 1. Negative noncontrast head CT. 2. No fracture or static subluxation of the cervical spine. 3. Post C5-C7 ACDF and interbody fusion without evidence of hardware failure or loosening. Electronically Signed   By: Sandi Mariscal M.D.   On: 03/21/2015 09:57   Ct Cervical Spine Wo Contrast  03/21/2015  CLINICAL DATA:  Post fall hitting back of head now with head and neck pain. EXAM: CT HEAD WITHOUT CONTRAST CT CERVICAL SPINE WITHOUT CONTRAST TECHNIQUE: Multidetector CT imaging of the head and cervical spine was performed following the standard protocol without intravenous contrast. Multiplanar CT image reconstructions of the cervical spine were also generated. COMPARISON:  Neck CT - 07/05/2013 FINDINGS: CT HEAD FINDINGS Regional soft tissues appear normal. No radiopaque foreign body. No displaced calvarial fracture. Gray-white differentiation is maintained. No CT evidence acute large territory infarct. No intraparenchymal or extra-axial mass or hemorrhage. Normal size and configuration of the ventricles and basilar cisterns. No midline shift. Intracranial atherosclerosis. There is scattered mucosal thickening involving the bilateral anterior and posterior ethmoidal air cells. Remaining  paranasal sinuses mastoid air cells remain normally aerated. Post bilateral cataract surgery. CT CERVICAL SPINE FINDINGS C1 to the superior endplate of T1 is imaged though evaluation of the mid in caudal aspects of the cervical spine is degraded secondary to beam hardening artifact due to patient body habitus. Normal alignment of the cervical spine. No anterolisthesis or retrolisthesis. The bilateral facets are normally aligned. The dens is normally positioned between the lateral masses of C1. Normal atlantodental and atlantoaxial articulations. Post C5-C7 ACDF and interbody fusion without evidence of hardware failure or loosening. No fracture or static subluxation of the cervical spine. Cervical vertebral body heights are preserved. Prevertebral soft tissues are normal. Remaining intervertebral disc space heights appear preserved. Regional soft tissues appear normal. Normal noncontrast appearance of the thyroid gland. IMPRESSION: 1. Negative noncontrast head CT. 2. No fracture or static subluxation of the cervical spine. 3. Post C5-C7 ACDF and interbody fusion without evidence of hardware failure or loosening. Electronically Signed   By: Sandi Mariscal M.D.   On: 03/21/2015 09:57   Current Outpatient Prescriptions on File Prior to Visit  Medication Sig Dispense Refill  . ABILIFY 30 MG tablet TAKE 1 TABLET BY MOUTH EVERY MORNING 30 tablet 0  . clonazePAM (KLONOPIN) 0.5 MG tablet TAKE 1 TABLET BY MOUTH TWICE A DAY 60 tablet 0  . diazepam (VALIUM) 5 MG tablet TAKE 1 TABLET BY MOUTH TWICE A DAY 60 tablet 0  . docusate sodium (COLACE) 100 MG capsule TAKE 1 CAPSULE BY MOUTH 2 TIMES DAILY AS NEEDED FOR CONSTIPATION 60 capsule 3  . gabapentin (NEURONTIN) 100 MG capsule Take 100 mg by mouth 3 (three) times daily.     Marland Kitchen lamoTRIgine (LAMICTAL) 100 MG tablet Take 3  tablets by mouth once daily    . meloxicam (MOBIC) 7.5 MG tablet Take 1 tablet (7.5 mg total) by mouth daily. 30 tablet 0  . metFORMIN (GLUCOPHAGE) 1000 MG  tablet TAKE 1 TABLET (1,000 MG TOTAL) BY MOUTH 2 (TWO) TIMES DAILY WITH A MEAL. 180 tablet 1  . methscopolamine (PAMINE) 2.5 MG TABS tablet Take 1 tablet by mouth 5 (five) times daily.  2  . mirtazapine (REMERON) 15 MG tablet TAKE 1 TABLET BY MOUTH AT BEDTIME 34 tablet 2  . omeprazole (PRILOSEC) 40 MG capsule TAKE ONE CAPSULE BY MOUTH EVERY DAY 90 capsule 3  . promethazine (PHENERGAN) 25 MG tablet TAKE 1/2 TO 1 TABLET BY MOUTH EVERY 8 HOURS AS NEEDED FOR NAUSEA 30 tablet 2  . rOPINIRole (REQUIP) 3 MG tablet TAKE 1 TABLET BY MOUTH AT BEDTIME 90 tablet 0  . simvastatin (ZOCOR) 10 MG tablet TAKE 1 TABLET BY MOUTH AT BEDTIME 90 tablet 1  . SYMBICORT 160-4.5 MCG/ACT inhaler INHALE 2 PUFFS INTO THE LUNGS 2 (TWO) TIMES DAILY. 10.2 Inhaler 0  . tolterodine (DETROL LA) 2 MG 24 hr capsule TAKE ONE CAPSULE BY MOUTH DAILY 90 capsule 0  . traMADol (ULTRAM) 50 MG tablet TAKE 2 TABLETS BY MOUTH EVERY MORNING AND 2 TABLETS BY MOUTH EVERY EVENING 120 tablet 0  . zolpidem (AMBIEN) 5 MG tablet TAKE 1 TABLET AT BEDTIME 30 tablet 0   No current facility-administered medications on file prior to visit.    Allergies  Allergen Reactions  . Doxycycline Other (See Comments)    Face red and swollen; no difficulty breathing    Past Medical History  Diagnosis Date  . Adenomatous colon polyp   . Insomnia   . RLS (restless legs syndrome)   . Colon polyp   . Incontinence   . Hepatitis B, chronic ([place])   . H/O: rheumatic fever   . Hyperlipidemia   . Unspecified disorders of nervous system   . Bipolar affective disorder ([place])     h/o  . H/O: CVA (cardiovascular accident)     TIA  . Fatty liver 04/09/08    found in abd CT  . Shortness of breath   . Obstructive sleep apnea     not using CPAP, last sleep study 2.5 years ago, per pt doctor is aware  . Blood transfusion 2000  . GERD (gastroesophageal reflux disease)   . Arthritis   . 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 Asthma   . Anxiety   . Depression     . Stroke [facility])     TIA- 2002  . Ulcer   . Diabetes mellitus without complication ([place])   . Anemia   . Blood transfusion without reported diagnosis   . Sleep apnea   . Platelets decreased ([place])     Past Surgical History  Procedure Laterality Date  . Back surgery      x 45  . Cholecystectomy    . Rotator cuff repair      left  . Abdominal hysterectomy      total  . Lumbar fusion  10/09  . Hemorroidectomy      and colon polyp removed  . Abdominal exploration surgery    . Posterior cervical fusion/foraminotomy    . Eye surgery      bilat  . Lumbar wound debridement  05/09/2011    Procedure: LUMBAR WOUND DEBRIDEMENT;  Surgeon: Dahlia Bailiff;  Location: [facility];  Service: Orthopedics;  Laterality:  N/A;  IRRIGATION AND DEBRIDEMENT SPINAL WOUND  . Axillary artery cannulation via 8-mm hemashield graft, median sternotomy, extracorporeal circulation with deep hypothermic circulatory arrest, repair of  aortic dessection  01/23/2009    Dr Arlyce Dice  . Cataract      both eyes  . Colonoscopy    . Liver biopsy  04/10/2012    Procedure: LIVER BIOPSY;  Surgeon: Inda Castle, MD;  Location: [facility];  Service: Endoscopy;  Laterality: N/A;  ultrasound to mark order for abd limited/liver to be marked to be sent by [email or web address]     Family History  Problem Relation Age of Onset  . Colon cancer Neg Hx   . Esophageal cancer Neg Hx   . Rectal cancer Neg Hx   . Breast cancer Mother   . Lung cancer Mother   . Stomach cancer Father   . Diabetes      4 aunts, and 1 uncle    Social History   Social History  . Marital Status: Single    Spouse Name: N/A  . Number of Children: 1  . Years of Education: N/A   Occupational History  . Disabled    Social History Main Topics  . Smoking status: Former Smoker -- 0.50 packs/day for 50 years    Types: Cigarettes    Quit date: 04/06/2013  . Smokeless tobacco: Never Used  . Alcohol Use: No  . Drug Use: No  . Sexual Activity: Not on file    Other Topics Concern  . Not on file   Social History Narrative   1 son, 21+ y/o   Daily caffeine use: 2 cups daily   Does not get regular exercise   Disability due to bipolar      Has a living will- she is a DNR (she has form at home per pt).   The PMH, PSH, Social History, Family History, Medications, and allergies have been reviewed in [facility], and have been updated if relevant.   Review of Systems  Constitutional: Negative.   Eyes: Negative.   Respiratory: Negative.   Cardiovascular: Negative.   Gastrointestinal: Negative.   Genitourinary: Negative.   Musculoskeletal: Positive for back pain. Negative for myalgias, joint swelling, gait problem, neck pain and neck stiffness.  Skin: Negative.   Neurological: Positive for headaches. Negative for dizziness and facial asymmetry.  Hematological: Negative.   Psychiatric/Behavioral: Negative.   All other systems reviewed and are negative.      Objective:    BP 144/82 mmHg  Pulse 92  Temp(Src) 97.7 F (36.5 C) (Oral)  Wt 218 lb 8 oz (99.111 kg)  SpO2 95%   Physical Exam  Constitutional: She is oriented to person, place, and time. She appears well-developed and well-nourished. No distress.  HENT:  Head: Normocephalic and atraumatic.  Eyes: Conjunctivae are normal.  Neck: Normal range of motion.  Cardiovascular: Normal rate.   Pulmonary/Chest: Effort normal.  Musculoskeletal: Normal range of motion. She exhibits no edema or tenderness.  Neurological: She is alert and oriented to person, place, and time. No cranial nerve deficit.  Skin: Skin is warm and dry.  Psychiatric: She has a normal mood and affect. Her behavior is normal. Judgment and thought content normal.  Nursing note and vitals reviewed.         Assessment & Plan:   Accidental fall from chair, subsequent encounter No Follow-up on file.

## 2015-03-30 NOTE — Assessment & Plan Note (Signed)
Probable coccyx bruising and ? post concussive symptoms.  Advised brain rest and follow up prn. Exam reassuring. The patient indicates understanding of these issues and agrees with the plan.

## 2015-03-30 NOTE — Progress Notes (Signed)
Pre visit review using our clinic review tool, if applicable. No additional management support is needed unless otherwise documented below in the visit note. 

## 2015-04-06 ENCOUNTER — Other Ambulatory Visit: Payer: Self-pay | Admitting: Family Medicine

## 2015-04-06 ENCOUNTER — Other Ambulatory Visit: Payer: Self-pay | Admitting: Gastroenterology

## 2015-04-07 NOTE — Telephone Encounter (Signed)
Rx called in to requested pharmacy 

## 2015-04-07 NOTE — Telephone Encounter (Signed)
Last f/u appt 02/2015

## 2015-04-14 ENCOUNTER — Other Ambulatory Visit: Payer: Self-pay | Admitting: Family Medicine

## 2015-04-14 NOTE — Telephone Encounter (Signed)
Rx called in to requested pharmacy 

## 2015-04-14 NOTE — Telephone Encounter (Signed)
Last f/u 08/2014-CPE

## 2015-04-17 ENCOUNTER — Other Ambulatory Visit: Payer: Self-pay | Admitting: Internal Medicine

## 2015-04-17 ENCOUNTER — Telehealth: Payer: Self-pay

## 2015-04-17 DIAGNOSIS — E1165 Type 2 diabetes mellitus with hyperglycemia: Secondary | ICD-10-CM

## 2015-04-17 NOTE — Telephone Encounter (Signed)
Pt notified as instructed and voiced understanding. Pt has lab appt on 04/20/15 at 9:20 and appt with Dr Deborra Medina on 04/22/15 at 10:30.

## 2015-04-17 NOTE — Telephone Encounter (Signed)
Her last A1C looked really good, not sure why her BS is so high. Why doesn't she make an appt for Wed next week, have A1C drawn on Monday so it can be discussed at the visit and we can discuss diabetic diet, talk about nutrition referral. I can tell you last night, she had 2 starches which is why BS was so elevated. Have her continue taking the Metformin however she is taking it and we can change next week if needed. Will order labs.

## 2015-04-17 NOTE — Telephone Encounter (Signed)
Pt left v/m; pt requesting information mailed to pt about diabetes and diabetic diet;fasting blood sugars have been running high; averaging > 200. This morning 3 AM BS was 430. Last night pt ate meatloaf, creamed potatoes and corn. Pt only taking metformin 1000 mg 1/2 tab bid. Med list has metformin 1000 mg one tab bid. [facility]  [place]. Pt checked BS while on phone and now at 2:30 pm BS is 207; pt has just eaten macaroni and tomatoes. Pt is not sure what to eat and pt also wants to know if should be taking metformin 1000 mg bid. Pt request cb. Dr Deborra Medina out of office but will send to Dr Deborra Medina and Webb Silversmith NP.

## 2015-04-20 ENCOUNTER — Other Ambulatory Visit (INDEPENDENT_AMBULATORY_CARE_PROVIDER_SITE_OTHER): Payer: Commercial Managed Care - HMO

## 2015-04-20 DIAGNOSIS — E1165 Type 2 diabetes mellitus with hyperglycemia: Secondary | ICD-10-CM

## 2015-04-20 LAB — HEMOGLOBIN A1C: HEMOGLOBIN A1C: 7.1 % — AB (ref 4.6–6.5)

## 2015-04-21 ENCOUNTER — Ambulatory Visit (INDEPENDENT_AMBULATORY_CARE_PROVIDER_SITE_OTHER): Payer: Commercial Managed Care - HMO | Admitting: Gastroenterology

## 2015-04-21 ENCOUNTER — Other Ambulatory Visit: Payer: Commercial Managed Care - HMO

## 2015-04-21 ENCOUNTER — Encounter: Payer: Self-pay | Admitting: Gastroenterology

## 2015-04-21 VITALS — BP 132/78 | HR 72 | Ht 63.0 in | Wt 223.8 lb

## 2015-04-21 DIAGNOSIS — E669 Obesity, unspecified: Secondary | ICD-10-CM | POA: Diagnosis not present

## 2015-04-21 DIAGNOSIS — K7460 Unspecified cirrhosis of liver: Secondary | ICD-10-CM

## 2015-04-21 DIAGNOSIS — R109 Unspecified abdominal pain: Secondary | ICD-10-CM | POA: Diagnosis not present

## 2015-04-21 MED ORDER — ONDANSETRON 4 MG PO TBDP: 4.0000 mg | ORAL_TABLET | Freq: Three times a day (TID) | ORAL | Status: DC | PRN

## 2015-04-21 NOTE — Progress Notes (Signed)
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 Previously followed by Dr. Deatra Ina, new patient to me.   Patient is unsure how long she had cirrhosis, she thinks she was diagnosed about 2 years ago. She had a liver biopsy in 2013 showing NASH but without cirrhotic changes at the time. She has had cirrhosis thought to be due to NASH / fatty liver based on her prior biopsy and course. She has had prior jaundice, she thinks she has had hepatitis B previously, she is not sure of a history of ascites. It is listed in her record she has chronic hepatitis B, however prior hepatitis B core AB negative. She has had fluctuations in her weight with progressive weight gain.   She has some ongoing nausea, and abdominal discomfort. She thinks she has had this ongoing for a few years. She has abdominal pain chronically throughout her entire abdomen, present essentially all the time but severity can fluctuate. Pain is in the middle to upper abdomen when at worse, although she is tender throughout the entire abdomen. She thinks the symptoms are stable over time. She takes Pamine (methscopolamine) 2.13m tablet five times per day for this issue. She also takes chronic valium and klonopin for anxiety, as well as phenergan for chronic nausea mobic daily for her abdominal pains. She also takes ultram PRN. She is eating ok, but eating small meals. She does not have much appetite. She thinks she has gained weight, cannot lose weight. No fevers. No vomiting. Eating can make some her pain worse at times but not routinely. She is not sure if she has had a prior hepatic encephalopathy but admits to being confused periodically. She otherwise endorses some chronic constipation which is stable.   She otherwise reports she had a fall when she fell out of a chair in recent months, she was told she had a concussion and hurt her tailbone leading to a short hospitalization. She was  also hospitalized for a panic attack 2 months ago.    She otherwise endorses takes prilosec 476monce daily and states she is compliant. She has had EGD, gastric emptying study, CT scans to evaluate her abdominal pain.   Prior workup Colonoscopy 3/16 - 3 small adenomas EGD 04/10/14 - mild gastritis, otherwise normal Liver biopsy 04/2012 - NASH but no cirrhosis CT abdomen 1/16 - liver with cirrhosis and associated splenomegaly Normal gastric emptying study 2015     Past Medical History  Diagnosis Date  . Adenomatous colon polyp   . Insomnia   . RLS (restless legs syndrome)   . Colon polyp   . Incontinence   . H/O: rheumatic fever   . Hyperlipidemia   . Unspecified disorders of nervous system   . [place]    [place]  . H/O: CVA (cardiovascular accident)     TIA  . Fatty liver 04/09/08    found in abd CT  . Shortness of breath   . Obstructive sleep apnea     not using CPAP, last sleep study 2.5 years ago, per pt doctor is aware  . Blood transfusion 2000  . GERD (gastroesophageal reflux disease)   . Arthritis   . 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 Asthma   . Anxiety   . Depression   . [facility]    [facility]  . Ulcer   . Diabetes mellitus without  complication ([place])   . Anemia   . Blood transfusion without reported diagnosis   . Sleep apnea   . Platelets decreased ([place])   . Cirrhosis ([place])     related to NASH     Past Surgical History  Procedure Laterality Date  . Back surgery      x 45  . Cholecystectomy    . Rotator cuff repair      left  . Abdominal hysterectomy      total  . Lumbar fusion  10/09  . Hemorroidectomy      and colon polyp removed  . Abdominal exploration surgery    . Posterior cervical fusion/foraminotomy    . Eye surgery      bilat  . Lumbar wound debridement  05/09/2011    Procedure: LUMBAR WOUND DEBRIDEMENT;  Surgeon: Dahlia Bailiff;  Location: [place];  Service: Orthopedics;  Laterality: N/A;  IRRIGATION AND  DEBRIDEMENT SPINAL WOUND  . Axillary artery cannulation via 8-mm hemashield graft, median sternotomy, extracorporeal circulation with deep hypothermic circulatory arrest, repair of  aortic dessection  01/23/2009    Dr Arlyce Dice  . Cataract      both eyes  . Colonoscopy    . Liver biopsy  04/10/2012    Procedure: LIVER BIOPSY;  Surgeon: Inda Castle, MD;  Location: [facility];  Service: Endoscopy;  Laterality: N/A;  ultrasound to mark order for abd limited/liver to be marked to be sent by [email or web address]    Family History  Problem Relation Age of Onset  . Colon cancer Neg Hx   . Esophageal cancer Neg Hx   . Rectal cancer Neg Hx   . Breast cancer Mother   . Lung cancer Mother   . Stomach cancer Father   . Diabetes      4 aunts, and 1 uncle   Social History  Substance Use Topics  . Smoking status: Former Smoker -- 0.50 packs/day for 50 years    Types: Cigarettes    Quit date: 04/06/2013  . Smokeless tobacco: Never Used  . Alcohol Use: No   Current Outpatient Prescriptions  Medication Sig Dispense Refill  . ABILIFY 30 MG tablet TAKE 1 TABLET BY MOUTH EVERY MORNING 30 tablet 0  . clonazePAM (KLONOPIN) 0.5 MG tablet TAKE 1 TABLET BY MOUTH TWICE A DAY 60 tablet 0  . diazepam (VALIUM) 5 MG tablet TAKE 1 TABLET BY MOUTH TWICE A DAY 60 tablet 0  . docusate sodium (COLACE) 100 MG capsule TAKE 1 CAPSULE BY MOUTH 2 TIMES DAILY AS NEEDED FOR CONSTIPATION 60 capsule 3  . gabapentin (NEURONTIN) 100 MG capsule Take 100 mg by mouth 3 (three) times daily.     Marland Kitchen lamoTRIgine (LAMICTAL) 100 MG tablet Take 3 tablets by mouth once daily    . meloxicam (MOBIC) 7.5 MG tablet Take 1 tablet (7.5 mg total) by mouth daily. 30 tablet 0  . metFORMIN (GLUCOPHAGE) 1000 MG tablet TAKE 1 TABLET (1,000 MG TOTAL) BY MOUTH 2 (TWO) TIMES DAILY WITH A MEAL. 180 tablet 1  . methscopolamine (PAMINE) 2.5 MG TABS tablet Take 1 tablet by mouth 5 (five) times daily.  2  . mirtazapine (REMERON) 15 MG tablet TAKE 1 TABLET BY  MOUTH AT BEDTIME 34 tablet 2  . omeprazole (PRILOSEC) 40 MG capsule TAKE ONE CAPSULE BY MOUTH EVERY DAY 90 capsule 3  . promethazine (PHENERGAN) 25 MG tablet TAKE 1/2 TO 1 TABLET BY MOUTH EVERY 8 HOURS AS NEEDED FOR NAUSEA 30 tablet 2  .  rOPINIRole (REQUIP) 3 MG tablet TAKE 1 TABLET BY MOUTH AT BEDTIME 90 tablet 0  . simvastatin (ZOCOR) 10 MG tablet TAKE 1 TABLET BY MOUTH AT BEDTIME 90 tablet 1  . SYMBICORT 160-4.5 MCG/ACT inhaler INHALE 2 PUFFS INTO THE LUNGS 2 (TWO) TIMES DAILY. 10.2 Inhaler 0  . tolterodine (DETROL LA) 2 MG 24 hr capsule TAKE ONE CAPSULE BY MOUTH DAILY 90 capsule 0  . traMADol (ULTRAM) 50 MG tablet TAKE 2 TABLETS BY MOUTH EVERY MORNING AND 2 TABLETS BY MOUTH EVERY EVENING 120 tablet 0  . zolpidem (AMBIEN) 5 MG tablet TAKE 1 TABLET BY MOUTH AT BEDTIME 30 tablet 0  . ondansetron (ZOFRAN ODT) 4 MG disintegrating tablet Take 1 tablet (4 mg total) by mouth every 8 (eight) hours as needed for nausea or vomiting. 50 tablet 1   No current facility-administered medications for this visit.   Allergies  Allergen Reactions  . Doxycycline Other (See Comments)    Face red and swollen; no difficulty breathing     Review of Systems: All systems reviewed and negative except where noted in HPI.   Lab Results  Component Value Date   WBC 6.8 02/19/2015   HGB 12.2 02/19/2015   HCT 36.5 02/19/2015   MCV 90.9 02/19/2015   PLT 132* 02/19/2015    Lab Results  Component Value Date   ALT 25 02/19/2015   AST 58* 02/19/2015   ALKPHOS 142* 02/19/2015   BILITOT 0.6 02/19/2015   Lab Results  Component Value Date   CREATININE 0.84 02/19/2015   BUN 14 02/19/2015   NA 141 02/19/2015   K 4.0 02/19/2015   CL 105 02/19/2015   CO2 27 02/19/2015   No results found for: AFP   Lab Results  Component Value Date   INR 0.9 02/03/2012   INR 0.8 RATIO 04/30/2007   Prior workup for chronic liver diseases negative other than positive ANA  Physical Exam: BP 132/78 mmHg  Pulse 72  Ht 5'  3" (1.6 m)  Wt 223 lb 12.8 oz (101.515 kg)  BMI 39.65 kg/m2 Constitutional: Pleasant,well-developed, female in no acute distress. HEENT: Normocephalic and atraumatic. Conjunctivae are normal. No scleral icterus. Neck supple.  Cardiovascular: Normal rate, regular rhythm.  Pulmonary/chest: Effort normal and breath sounds normal. No wheezing, rales or rhonchi. Abdominal: Soft, obese / protuberant abdomen, diffuse tenderness to palpation to light touch in all areas without peritoneal signs, (+) Carnett sign. Bowel sounds active throughout. There are no masses palpable. . Extremities: trace edema Lymphadenopathy: No cervical adenopathy noted. Neurological: Alert and oriented to person place and time. No asterixis Skin: Skin is warm and dry. No rashes noted. Psychiatric: Normal mood and affect. Behavior is normal.   ASSESSMENT AND PLAN: 63 y/o female here for follow up, new patient to me. From review of the records and reviewing the history with the patient she appears to have compensated cirrhosis due to NASH, diagnosed relatively recently as a prior liver biopsy in 2013 showed only NASH without cirrhosis, however imaging shows cirrhotic liver with splenomegaly and secondary thrombocytopenia. She otherwise has chronic abdominal pain for which she has had a negative evaluation as outlined above. On exam he pain is easily reproducible and with a positive Carnett sign I suspect she has abdominal wall pain, perhaps related to her obesity.   I discussed cirrhosis with her, long term concerns and risks of decompensation and HCC. She is due for [facility] screening at this time and will refer for [name] and AFP. Otherwise, while  I suspect she has abdominal wall pain driving her symptoms, will assess for ascites on [name] to see if this is present and contributing to her pain or symptoms.   Otherwise, I am concerned about her polypharmacy and multiple sedating medications in this patient with cirrhosis and at risk for  encephalopathy. I do not think methscopolamine is a good regimen for her to be on long term for her pain and will stop it. Further, I don't recommend she take NSAIDs routinely given her increase risk for GI bleeding, and it is not a good regimen for long term abdominal pain, and will also stop this. I will increase her prilosec to BID to see if this provides any benefit in addition to stopping her mobic, in regards to some of her prandial symptoms. Otherwise will stop phenergan for her chronic nausea, and she can take zofran PRN. I don't recommend routine benzodiazepines for this patient with cirrhosis and recommend she follow up with the prescribing physician to discuss her anxiety regimen.  Otherwise, in regards to her reported history of hepatitis B, will send a surface AG to ensure negative and reassure the patient as I don't think she has hepatitis B. Otherwise counseled her on weight loss and I think this would help reduce her abdominal pain.   I will await results of her [name] and see how she responds to changes in medications. She should see [name] in 3 months for follow up.   Hulbert Cellar, MD [facility] Pager [phone]

## 2015-04-21 NOTE — Patient Instructions (Addendum)
We have sent the following medications to your pharmacy for you to pick up at your convenience: Stephanie [name] has requested that you go to the basement for the following lab work before leaving today: AFP, Hep B Surface Antigen  Stop Phenergan, mobic, pamine.  Increase prilosec to twice a day.  You have been scheduled for an abdominal ultrasound at [facility] (1st floor of hospital) on 04/28/2015 at 8:30am. Please arrive 15 minutes prior to your appointment for registration. Make certain not to have anything to eat or drink 6 hours prior to your appointment. Should you need to reschedule your appointment, please contact radiology at [phone]. This test typically takes about 30 minutes to perform.

## 2015-04-22 ENCOUNTER — Telehealth: Payer: Self-pay | Admitting: Family Medicine

## 2015-04-22 ENCOUNTER — Encounter: Payer: Self-pay | Admitting: Family Medicine

## 2015-04-22 ENCOUNTER — Ambulatory Visit (INDEPENDENT_AMBULATORY_CARE_PROVIDER_SITE_OTHER): Payer: Commercial Managed Care - HMO | Admitting: Family Medicine

## 2015-04-22 VITALS — BP 128/68 | HR 90 | Temp 97.8°F | Wt 222.2 lb

## 2015-04-22 DIAGNOSIS — E118 Type 2 diabetes mellitus with unspecified complications: Secondary | ICD-10-CM

## 2015-04-22 LAB — HEPATITIS B SURFACE ANTIGEN: HEP B S AG: NEGATIVE

## 2015-04-22 LAB — AFP TUMOR MARKER: AFP TUMOR MARKER: 4.9 ng/mL (ref <6.1)

## 2015-04-22 NOTE — Patient Instructions (Signed)
Good to see you. Please increase your Metformin to 1000 mg twice daily.  Stop by to see Rosaria Ferries on your way out to set up your appointment with a diabetic nutritionist.

## 2015-04-22 NOTE — Telephone Encounter (Signed)
Attempted to contact pt; line busy

## 2015-04-22 NOTE — Assessment & Plan Note (Signed)
Deteriorated. >15 minutes spent in face to face time with patient, >50% spent in counselling or coordination of care. Increase Metformin to 1000 mg twice daily (how she was supposed to be taking it) and refer to diabetic nutritionist.

## 2015-04-22 NOTE — Telephone Encounter (Signed)
The diabetic nutritionist will help her with this but can you please send her a copy of the eat right diet?

## 2015-04-22 NOTE — Progress Notes (Signed)
Subjective:   Patient ID: Stephanie [name], female    DOB: 04/17/1951, 64 y.o.   MRN: [id number]  Stephanie [name] is a pleasant 62 y.o. year old female who presents to clinic today with Follow-up  on 04/22/2015  HPI:  DM- deteriorated. Has been eating more starches.  "I dont know how to eat for a diabetic." a1c jumped up from 6.1 4 months ago to 7.1 this week.  Currently taking Metformin 500 mg twice daily.  Checking FSBS regularly and ranging in 200s or higher after eating. Highest FBS reading was in 400s and she felt "bad" with a headache and nausea.  Current Outpatient Prescriptions on File Prior to Visit  Medication Sig Dispense Refill  . ABILIFY 30 MG tablet TAKE 1 TABLET BY MOUTH EVERY MORNING 30 tablet 0  . clonazePAM (KLONOPIN) 0.5 MG tablet TAKE 1 TABLET BY MOUTH TWICE A DAY 60 tablet 0  . diazepam (VALIUM) 5 MG tablet TAKE 1 TABLET BY MOUTH TWICE A DAY 60 tablet 0  . docusate sodium (COLACE) 100 MG capsule TAKE 1 CAPSULE BY MOUTH 2 TIMES DAILY AS NEEDED FOR CONSTIPATION 60 capsule 3  . gabapentin (NEURONTIN) 100 MG capsule Take 100 mg by mouth 3 (three) times daily.     Marland Kitchen lamoTRIgine (LAMICTAL) 100 MG tablet Take 3 tablets by mouth once daily    . metFORMIN (GLUCOPHAGE) 1000 MG tablet TAKE 1 TABLET (1,000 MG TOTAL) BY MOUTH 2 (TWO) TIMES DAILY WITH A MEAL. 180 tablet 1  . mirtazapine (REMERON) 15 MG tablet TAKE 1 TABLET BY MOUTH AT BEDTIME 34 tablet 2  . omeprazole (PRILOSEC) 40 MG capsule TAKE ONE CAPSULE BY MOUTH EVERY DAY (Patient taking differently: TAKE two CAPSULE BY MOUTH EVERY DAY) 90 capsule 3  . ondansetron (ZOFRAN ODT) 4 MG disintegrating tablet Take 1 tablet (4 mg total) by mouth every 8 (eight) hours as needed for nausea or vomiting. 50 tablet 1  . rOPINIRole (REQUIP) 3 MG tablet TAKE 1 TABLET BY MOUTH AT BEDTIME 90 tablet 0  . simvastatin (ZOCOR) 10 MG tablet TAKE 1 TABLET BY MOUTH AT BEDTIME 90 tablet 1  . SYMBICORT 160-4.5 MCG/ACT inhaler INHALE 2 PUFFS  INTO THE LUNGS 2 (TWO) TIMES DAILY. 10.2 Inhaler 0  . tolterodine (DETROL LA) 2 MG 24 hr capsule TAKE ONE CAPSULE BY MOUTH DAILY 90 capsule 0  . traMADol (ULTRAM) 50 MG tablet TAKE 2 TABLETS BY MOUTH EVERY MORNING AND 2 TABLETS BY MOUTH EVERY EVENING 120 tablet 0  . zolpidem (AMBIEN) 5 MG tablet TAKE 1 TABLET BY MOUTH AT BEDTIME 30 tablet 0  . meloxicam (MOBIC) 7.5 MG tablet Take 1 tablet (7.5 mg total) by mouth daily. (Patient not taking: Reported on 04/22/2015) 30 tablet 0  . methscopolamine (PAMINE) 2.5 MG TABS tablet Take 1 tablet by mouth 5 (five) times daily.  2   No current facility-administered medications on file prior to visit.    Allergies  Allergen Reactions  . Doxycycline Other (See Comments)    Face red and swollen; no difficulty breathing    Past Medical History  Diagnosis Date  . Adenomatous colon polyp   . Insomnia   . RLS (restless legs syndrome)   . Colon polyp   . Incontinence   . H/O: rheumatic fever   . Hyperlipidemia   . Unspecified disorders of nervous system   . Bipolar affective disorder ([place])     h/o  . H/O: CVA (cardiovascular accident)     TIA  .  Fatty liver 04/09/08    found in abd CT  . Shortness of breath   . Obstructive sleep apnea     not using CPAP, last sleep study 2.5 years ago, per pt doctor is aware  . Blood transfusion 2000  . GERD (gastroesophageal reflux disease)   . Arthritis   . 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 Asthma   . Anxiety   . Depression   . Stroke [facility])     TIA- 2002  . Ulcer   . Diabetes mellitus without complication ([place])   . Anemia   . Blood transfusion without reported diagnosis   . Sleep apnea   . Platelets decreased ([place])   . Cirrhosis ([place])     related to NASH    Past Surgical History  Procedure Laterality Date  . Back surgery      x 45  . Cholecystectomy    . Rotator cuff repair      left  . Abdominal hysterectomy      total  . Lumbar fusion  10/09  . Hemorroidectomy      and colon polyp  removed  . Abdominal exploration surgery    . Posterior cervical fusion/foraminotomy    . Eye surgery      bilat  . Lumbar wound debridement  05/09/2011    Procedure: LUMBAR WOUND DEBRIDEMENT;  Surgeon: Dahlia Bailiff;  Location: [place];  Service: Orthopedics;  Laterality: N/A;  IRRIGATION AND DEBRIDEMENT SPINAL WOUND  . Axillary artery cannulation via 8-mm hemashield graft, median sternotomy, extracorporeal circulation with deep hypothermic circulatory arrest, repair of  aortic dessection  01/23/2009    Dr Arlyce Dice  . Cataract      both eyes  . Colonoscopy    . Liver biopsy  04/10/2012    Procedure: LIVER BIOPSY;  Surgeon: Inda Castle, MD;  [facility];  Service: Endoscopy;  Laterality: N/A;  ultrasound to mark order for abd limited/liver to be marked to be sent by [email or web address]     Family History  Problem Relation Age of Onset  . Colon cancer Neg Hx   . Esophageal cancer Neg Hx   . Rectal cancer Neg Hx   . Breast cancer Mother   . Lung cancer Mother   . Stomach cancer Father   . Diabetes      4 aunts, and 1 uncle    Social History   Social History  . Marital Status: Single    Spouse Name: N/A  . Number of Children: 1  . Years of Education: N/A   Occupational History  . Disabled    Social History Main Topics  . Smoking status: Former Smoker -- 0.50 packs/day for 50 years    Types: Cigarettes    Quit date: 04/06/2013  . Smokeless tobacco: Never Used  . Alcohol Use: No  . Drug Use: No  . Sexual Activity: Not on file   Other Topics Concern  . Not on file   Social History Narrative   1 son, 74+ y/o   Daily caffeine use: 2 cups daily   Does not get regular exercise   Disability due to bipolar      Has a living will- she is a DNR (she has form at home per pt).   The PMH, PSH, Social History, Family History, Medications, and allergies have been reviewed in [facility], and have been updated if relevant.    Review of Systems  Eyes: Negative.  Gastrointestinal: Positive for nausea.  Endocrine: Negative.   Musculoskeletal: Negative.   Psychiatric/Behavioral: Negative.   All other systems reviewed and are negative.      Objective:    BP 128/68 mmHg  Pulse 90  Temp(Src) 97.8 F (36.6 C) (Oral)  Wt 222 lb 4 oz (100.812 kg)  SpO2 93%   Physical Exam  Constitutional: She is oriented to person, place, and time. She appears well-developed and well-nourished. No distress.  HENT:  Head: Normocephalic.  Eyes: Conjunctivae are normal.  Cardiovascular: Normal rate.   Pulmonary/Chest: Effort normal.  Neurological: She is alert and oriented to person, place, and time.  Skin: Skin is warm and dry.  Psychiatric: She has a normal mood and affect. Her behavior is normal. Judgment and thought content normal.  Nursing note and vitals reviewed.         Assessment & Plan:   Controlled type 2 diabetes mellitus with complication, without long-term current use of insulin ([place]) - Plan: Ambulatory referral to diabetic education No Follow-up on file.

## 2015-04-22 NOTE — Progress Notes (Signed)
Pre visit review using our clinic review tool, if applicable. No additional management support is needed unless otherwise documented below in the visit note. 

## 2015-04-22 NOTE — Telephone Encounter (Signed)
Spoke to pt and advised per Dr Deborra Medina. Eat right diet mailed

## 2015-04-22 NOTE — Telephone Encounter (Signed)
Pt called and says she forgot to ask you how many grams of carbs and sugar is she supposed to have in a normal day.  She requests a c/b home: [phone] or cell:  [phone] Thank you.

## 2015-04-28 ENCOUNTER — Encounter: Payer: Commercial Managed Care - HMO | Attending: Family Medicine | Admitting: *Deleted

## 2015-04-28 ENCOUNTER — Encounter: Payer: Self-pay | Admitting: *Deleted

## 2015-04-28 ENCOUNTER — Ambulatory Visit (HOSPITAL_COMMUNITY)
Admission: RE | Admit: 2015-04-28 | Discharge: 2015-04-28 | Disposition: A | Discharge status: Home / Self Care | Payer: Commercial Managed Care - HMO | Source: Ambulatory Visit | Attending: Gastroenterology | Admitting: Gastroenterology

## 2015-04-28 VITALS — BP 104/68 | Ht 63.0 in | Wt 224.8 lb

## 2015-04-28 DIAGNOSIS — R109 Unspecified abdominal pain: Secondary | ICD-10-CM | POA: Diagnosis not present

## 2015-04-28 DIAGNOSIS — K7460 Unspecified cirrhosis of liver: Secondary | ICD-10-CM

## 2015-04-28 DIAGNOSIS — E1165 Type 2 diabetes mellitus with hyperglycemia: Secondary | ICD-10-CM

## 2015-04-28 DIAGNOSIS — Z9049 Acquired absence of other specified parts of digestive tract: Secondary | ICD-10-CM | POA: Diagnosis not present

## 2015-04-28 DIAGNOSIS — R161 Splenomegaly, not elsewhere classified: Secondary | ICD-10-CM | POA: Insufficient documentation

## 2015-04-28 DIAGNOSIS — E119 Type 2 diabetes mellitus without complications: Secondary | ICD-10-CM | POA: Insufficient documentation

## 2015-04-28 NOTE — Patient Instructions (Addendum)
Check blood sugars 1 x day before breakfast or 2 hrs after supper every day Eat 3 meals day, 1-2 snacks a day Space meals 4-6 hours apart Don't skip meals Avoid sugar sweetened drinks (soda, tea, coffee) Limit desserts and sweets Bring blood sugar records to the next class

## 2015-04-28 NOTE — Progress Notes (Signed)
Diabetes Self-Management Education  Visit Type: First/Initial  Appt. Start Time: 1350 Appt. End Time: 1510  04/28/2015  [name], identified by name and date of birth, is a 64 y.o. female with a diagnosis of Diabetes: Type 2.   ASSESSMENT  Blood pressure 104/68, height 5' 3"  (1.6 m), weight 224 lb 12.8 oz (101.969 kg). Body mass index is 39.83 kg/(m^2).      Diabetes Self-Management Education - 04/28/15 1555    Visit Information   Visit Type First/Initial   Initial Visit   Diabetes Type Type 2   Are you currently following a meal plan? No   Are you taking your medications as prescribed? Yes   Date Diagnosed 3 years ago   Health Coping   How would you rate your overall health? Fair   Psychosocial Assessment   Patient Belief/Attitude about Diabetes Other (comment)  "it bothers me"   Self-care barriers None   Self-management support Doctor's office;CDE visits   Patient Concerns Nutrition/Meal planning;Medication;Monitoring;Healthy Lifestyle;Glycemic Control;Weight Control   Special Needs None   Preferred Learning Style Auditory;Visual   Learning Readiness Ready   How often do you need to have someone help you when you read instructions, pamphlets, or other written materials from your doctor or pharmacy? 3 - Sometimes  Pt didn't bring glasses to appointment and paperwork was completed by this nurse.    What is the last grade level you completed in school? 8th   Complications   Last HgB A1C per patient/outside source 7.1 %  04/20/15   How often do you check your blood sugar? 1-2 times/day   Fasting Blood glucose range (mg/dL) 180-200;>200  Pt reports FBG's range from 220-438 mg/dL with reading of 220's mg/dL today.   Have you had a dilated eye exam in the past 12 months? Yes   Have you had a dental exam in the past 12 months? No   Are you checking your feet? No   Dietary Intake   Breakfast skips   Lunch skips   Snack (afternoon) tortilla chips, crackers, sweets    Dinner frozen dinner   Snack (evening) sweets   Beverage(s) regular soda, sugar in tea and coffee   Exercise   Exercise Type ADL's   Patient Education   Previous Diabetes Education No   Disease state  Definition of diabetes, type 1 and 2, and the diagnosis of diabetes   Nutrition management  Role of diet in the treatment of diabetes and the relationship between the three main macronutrients and blood glucose level;Food label reading, portion sizes and measuring food.   Physical activity and exercise  Role of exercise on diabetes management, blood pressure control and cardiac health.   Medications Reviewed patients medication for diabetes, action, purpose, timing of dose and side effects.   Monitoring Purpose and frequency of SMBG.;Identified appropriate SMBG and/or A1C goals.   Chronic complications Relationship between chronic complications and blood glucose control   Psychosocial adjustment Role of stress on diabetes;Identified and addressed patients feelings and concerns about diabetes   Individualized Goals (developed by patient)   Reducing Risk Improve blood sugars Decrease medications Prevent diabetes complications Lose weight Lead a healthier lifestyle Become more fit   Outcomes   Expected Outcomes Demonstrated interest in learning. Expect positive outcomes      Individualized Plan for Diabetes Self-Management Training:   Learning Objective:  Patient will have a greater understanding of diabetes self-management. Patient education plan is to attend individual and/or group sessions per assessed needs and  concerns.   Plan:   Patient Instructions  Check blood sugars 1 x day before breakfast or 2 hrs after supper every day Eat 3 meals day, 1-2 snacks a day Space meals 4-6 hours apart Don't skip meals Avoid sugar sweetened drinks (soda, tea, coffee) Limit desserts and sweets Bring blood sugar records to the next class   Expected Outcomes:  Demonstrated interest in  learning. Expect positive outcomes  Education material provided: General Meal Planning Guidelines  If problems or questions, patient to contact team via:   Johny Drilling, [place], [place], CDE [phone]  Future DSME appointment:  Thursday June 04, 2015 for Class 1

## 2015-05-02 ENCOUNTER — Other Ambulatory Visit: Payer: Self-pay | Admitting: Family Medicine

## 2015-05-04 ENCOUNTER — Other Ambulatory Visit: Payer: Self-pay | Admitting: Family Medicine

## 2015-05-04 ENCOUNTER — Telehealth: Payer: Self-pay

## 2015-05-04 NOTE — Telephone Encounter (Signed)
Last f/u 08/2014-CPE

## 2015-05-04 NOTE — Telephone Encounter (Signed)
Rx called in to requested pharmacyy

## 2015-05-04 NOTE — Telephone Encounter (Signed)
Pt requesting powder that was given for sweating under breast (yeast infection). Pt does not know the name of med and is not sure last time presribed. Pt request med to [facility]. Last f/u appt 04/22/15.

## 2015-05-05 MED ORDER — NYSTATIN 100000 UNIT/GM EX POWD: Freq: Two times a day (BID) | CUTANEOUS | Status: DC

## 2015-05-05 NOTE — Telephone Encounter (Signed)
eRx for Nystatin powder sent. Pt needs OV if symptoms do not improve as expected.

## 2015-05-05 NOTE — Telephone Encounter (Signed)
Spoke to pt and advised per Dr Deborra Medina.

## 2015-05-06 IMAGING — US ABDOMEN ULTRASOUND
1 series · 14 of 25 positions shown · non-contrast
Comparison: CT of 07/01/2012

CLINICAL DATA: Cirrhosis and thrombocytopenia.

EXAM:
ULTRASOUND ABDOMEN COMPLETE

[Series 1: abdomen ultrasound · 0.32mm/px · 14 of 133 slices shown]
[im 1/133]
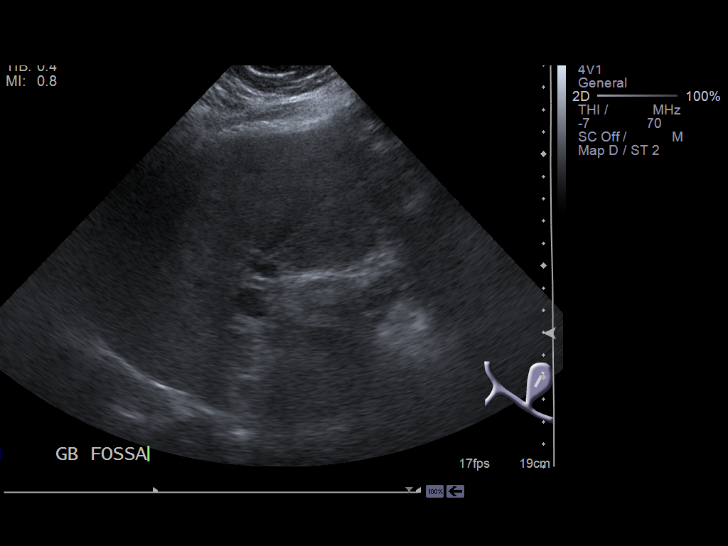
[im 12/133]
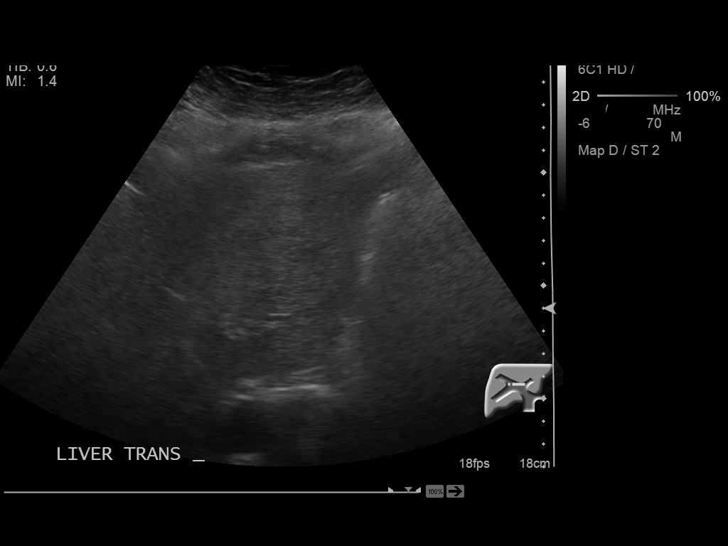
[im 23/133]
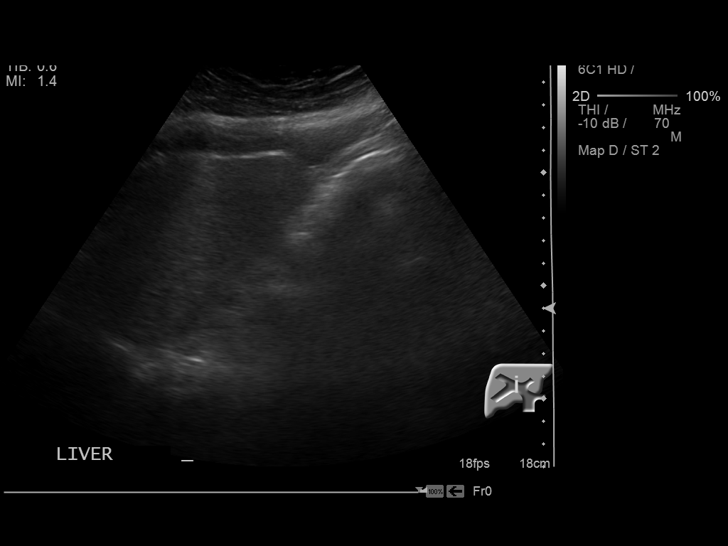
[im 34/133]
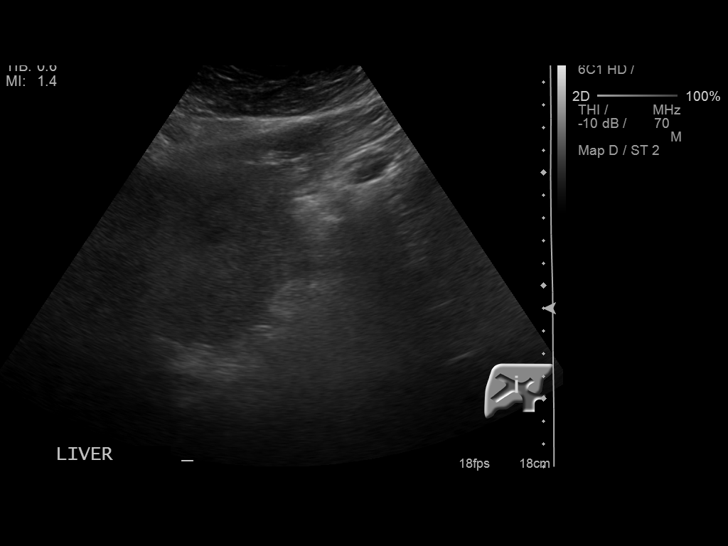
[im 45/133]
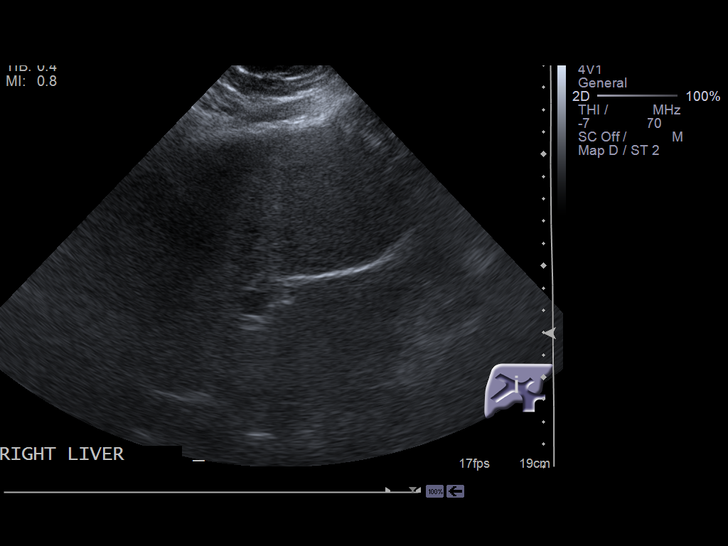
[im 50/133]
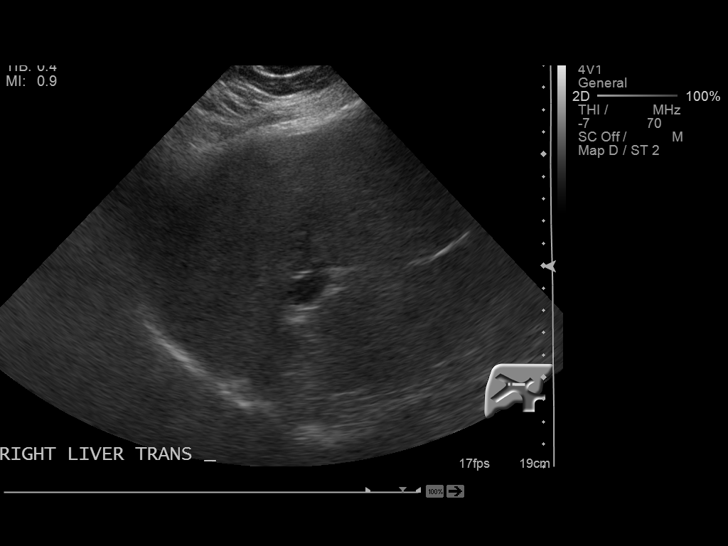
[im 61/133]
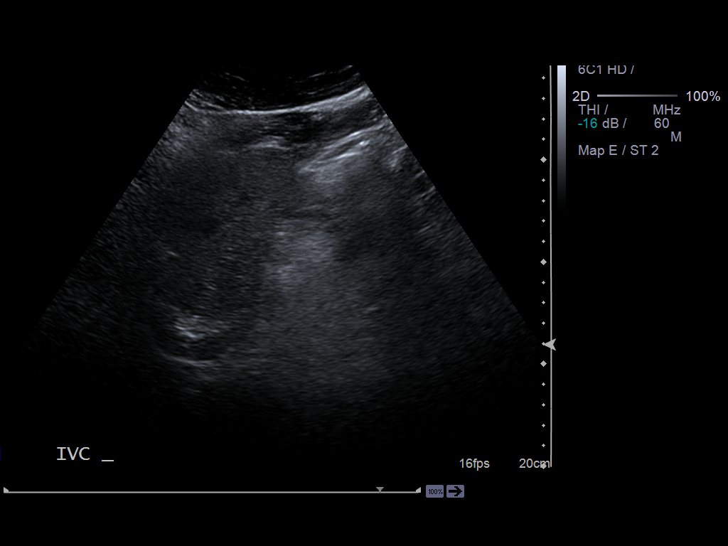
[im 72/133]
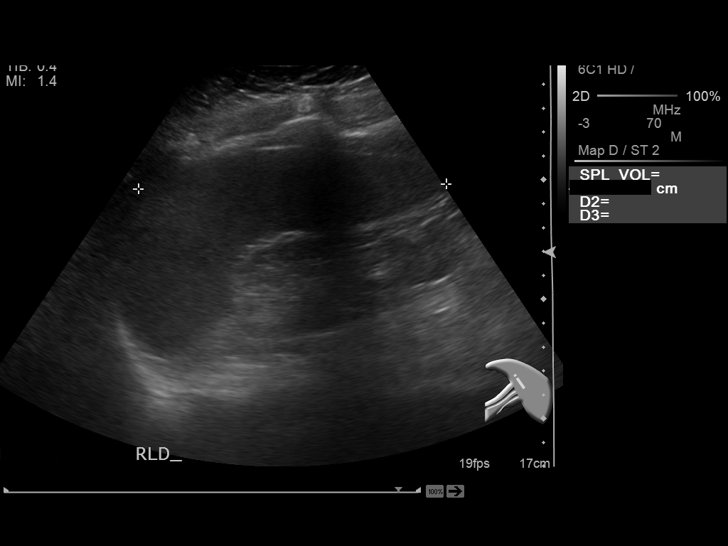
[im 83/133]
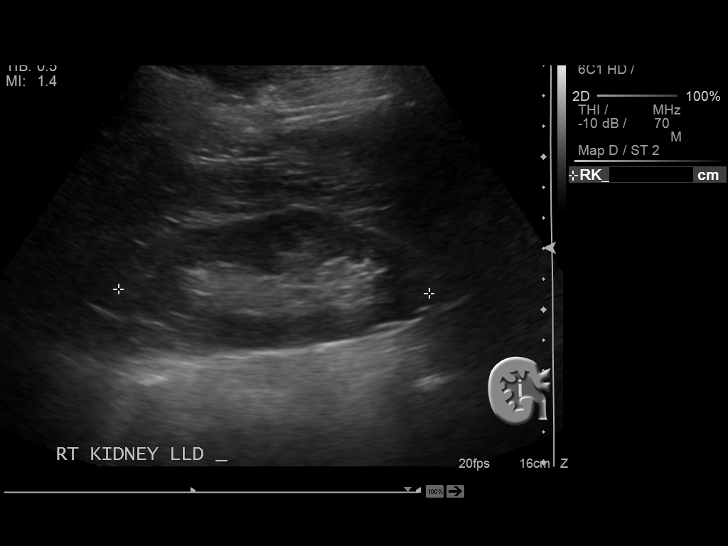
[im 89/133]
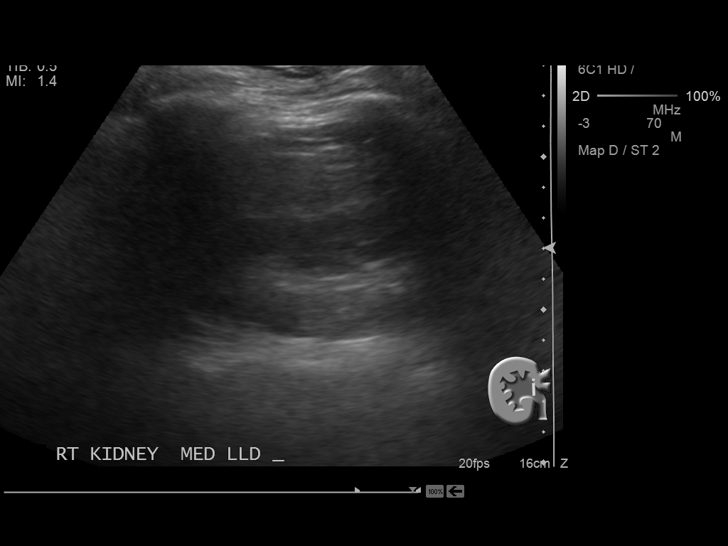
[im 100/133]
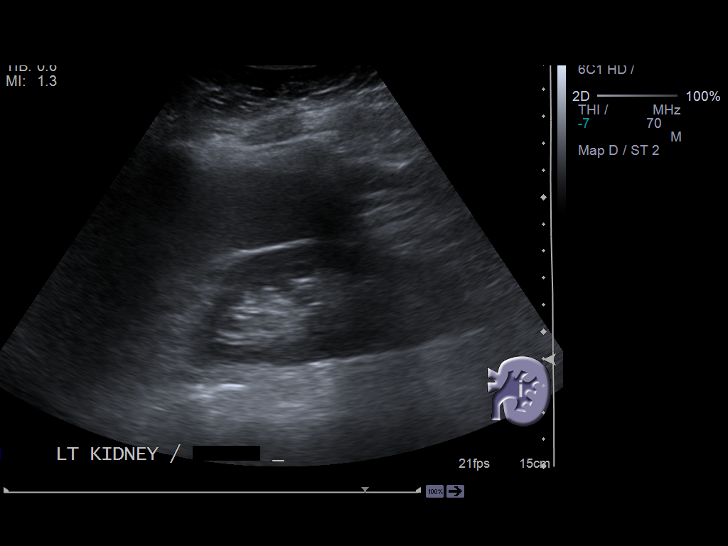
[im 111/133]
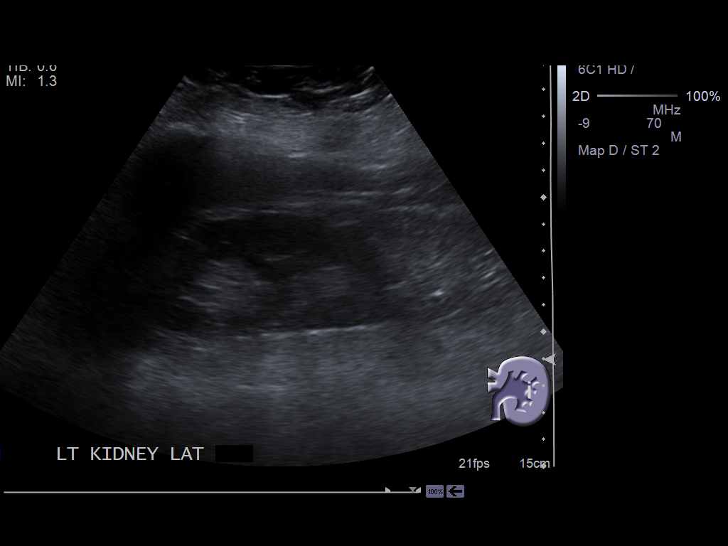
[im 122/133]
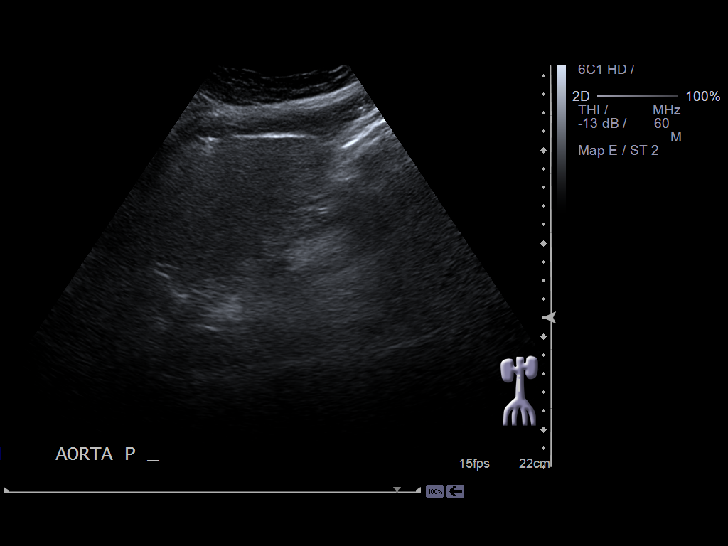
[im 133/133]
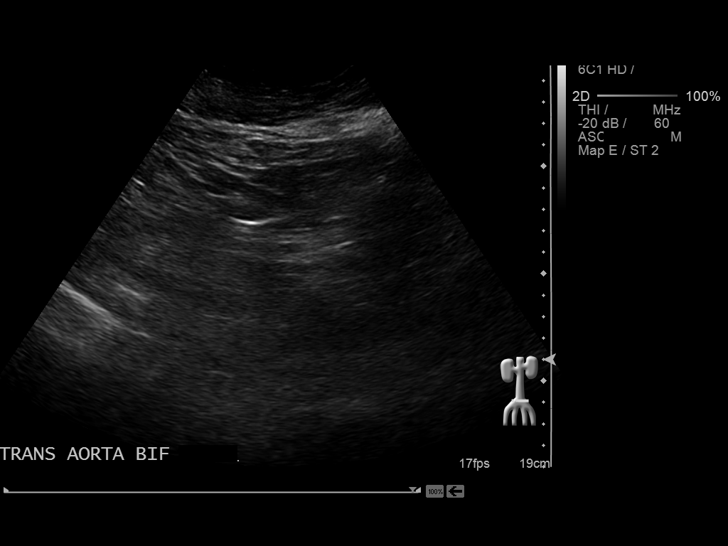

[14 of 25 positions shown; findings below may reference images not displayed]

FINDINGS: Gallbladder: Surgically absent.

Common bile duct: Diameter: Normal, 3 mm.

Liver: Moderate cirrhosis.  No focal liver lesion.

IVC: No abnormality visualized.

Pancreas: Obscured by overlying bowel gas.

Spleen: Mild splenomegaly. 12.9 cm craniocaudal. Splenic volume of
432 cc.

Right Kidney: Length: 10.2 cm.. Echogenicity within normal limits.
No mass or hydronephrosis visualized.

Left Kidney: Length: 10.7 cm. Echogenicity within normal limits. No
mass or hydronephrosis visualized.

Abdominal aorta: Poorly visualized due to overlying bowel gas.

Other findings: No ascites.
IMPRESSION: 1. Cirrhosis without evidence of hepatocellular carcinoma.
2.  Portions of anatomy obscured by overlying bowel gas.
3. Mild splenomegaly.

## 2015-05-08 ENCOUNTER — Other Ambulatory Visit: Payer: Self-pay

## 2015-05-08 DIAGNOSIS — D696 Thrombocytopenia, unspecified: Secondary | ICD-10-CM

## 2015-05-10 ENCOUNTER — Other Ambulatory Visit: Payer: Self-pay | Admitting: Hematology and Oncology

## 2015-05-10 DIAGNOSIS — D696 Thrombocytopenia, unspecified: Secondary | ICD-10-CM

## 2015-05-11 ENCOUNTER — Other Ambulatory Visit: Payer: Self-pay | Admitting: Family Medicine

## 2015-05-11 ENCOUNTER — Inpatient Hospital Stay (HOSPITAL_BASED_OUTPATIENT_CLINIC_OR_DEPARTMENT_OTHER): Payer: Commercial Managed Care - HMO | Admitting: Hematology and Oncology

## 2015-05-11 ENCOUNTER — Inpatient Hospital Stay: Payer: Commercial Managed Care - HMO | Attending: Hematology and Oncology

## 2015-05-11 ENCOUNTER — Ambulatory Visit (INDEPENDENT_AMBULATORY_CARE_PROVIDER_SITE_OTHER): Payer: Commercial Managed Care - HMO | Admitting: Primary Care

## 2015-05-11 ENCOUNTER — Encounter: Payer: Self-pay | Admitting: Primary Care

## 2015-05-11 VITALS — BP 136/82 | HR 96 | Temp 98.8°F | Ht 63.0 in | Wt 220.1 lb

## 2015-05-11 VITALS — BP 136/79 | HR 98 | Temp 96.6°F | Resp 18 | Ht 63.0 in | Wt 221.6 lb

## 2015-05-11 DIAGNOSIS — G4730 Sleep apnea, unspecified: Secondary | ICD-10-CM

## 2015-05-11 DIAGNOSIS — R531 Weakness: Secondary | ICD-10-CM | POA: Insufficient documentation

## 2015-05-11 DIAGNOSIS — F329 Major depressive disorder, single episode, unspecified: Secondary | ICD-10-CM | POA: Insufficient documentation

## 2015-05-11 DIAGNOSIS — Z800 Family history of malignant neoplasm of digestive organs: Secondary | ICD-10-CM

## 2015-05-11 DIAGNOSIS — E785 Hyperlipidemia, unspecified: Secondary | ICD-10-CM | POA: Diagnosis not present

## 2015-05-11 DIAGNOSIS — R32 Unspecified urinary incontinence: Secondary | ICD-10-CM

## 2015-05-11 DIAGNOSIS — K7460 Unspecified cirrhosis of liver: Secondary | ICD-10-CM | POA: Diagnosis not present

## 2015-05-11 DIAGNOSIS — R161 Splenomegaly, not elsewhere classified: Secondary | ICD-10-CM | POA: Insufficient documentation

## 2015-05-11 DIAGNOSIS — K219 Gastro-esophageal reflux disease without esophagitis: Secondary | ICD-10-CM | POA: Insufficient documentation

## 2015-05-11 DIAGNOSIS — Z87891 Personal history of nicotine dependence: Secondary | ICD-10-CM | POA: Insufficient documentation

## 2015-05-11 DIAGNOSIS — Z86010 Personal history of colonic polyps: Secondary | ICD-10-CM | POA: Diagnosis not present

## 2015-05-11 DIAGNOSIS — G4700 Insomnia, unspecified: Secondary | ICD-10-CM | POA: Insufficient documentation

## 2015-05-11 DIAGNOSIS — K760 Fatty (change of) liver, not elsewhere classified: Secondary | ICD-10-CM | POA: Insufficient documentation

## 2015-05-11 DIAGNOSIS — Z803 Family history of malignant neoplasm of breast: Secondary | ICD-10-CM | POA: Insufficient documentation

## 2015-05-11 DIAGNOSIS — D649 Anemia, unspecified: Secondary | ICD-10-CM | POA: Diagnosis not present

## 2015-05-11 DIAGNOSIS — Z8673 Personal history of transient ischemic attack (TIA), and cerebral infarction without residual deficits: Secondary | ICD-10-CM | POA: Insufficient documentation

## 2015-05-11 DIAGNOSIS — G2581 Restless legs syndrome: Secondary | ICD-10-CM

## 2015-05-11 DIAGNOSIS — M797 Fibromyalgia: Secondary | ICD-10-CM | POA: Insufficient documentation

## 2015-05-11 DIAGNOSIS — Z8669 Personal history of other diseases of the nervous system and sense organs: Secondary | ICD-10-CM

## 2015-05-11 DIAGNOSIS — F419 Anxiety disorder, unspecified: Secondary | ICD-10-CM | POA: Insufficient documentation

## 2015-05-11 DIAGNOSIS — G8929 Other chronic pain: Secondary | ICD-10-CM | POA: Diagnosis not present

## 2015-05-11 DIAGNOSIS — M129 Arthropathy, unspecified: Secondary | ICD-10-CM

## 2015-05-11 DIAGNOSIS — R1013 Epigastric pain: Secondary | ICD-10-CM

## 2015-05-11 DIAGNOSIS — F319 Bipolar disorder, unspecified: Secondary | ICD-10-CM | POA: Diagnosis not present

## 2015-05-11 DIAGNOSIS — J45909 Unspecified asthma, uncomplicated: Secondary | ICD-10-CM

## 2015-05-11 DIAGNOSIS — R5383 Other fatigue: Secondary | ICD-10-CM

## 2015-05-11 DIAGNOSIS — Z862 Personal history of diseases of the blood and blood-forming organs and certain disorders involving the immune mechanism: Secondary | ICD-10-CM | POA: Diagnosis not present

## 2015-05-11 DIAGNOSIS — Z7984 Long term (current) use of oral hypoglycemic drugs: Secondary | ICD-10-CM | POA: Insufficient documentation

## 2015-05-11 DIAGNOSIS — Z801 Family history of malignant neoplasm of trachea, bronchus and lung: Secondary | ICD-10-CM

## 2015-05-11 DIAGNOSIS — D696 Thrombocytopenia, unspecified: Secondary | ICD-10-CM

## 2015-05-11 DIAGNOSIS — Z79899 Other long term (current) drug therapy: Secondary | ICD-10-CM | POA: Diagnosis not present

## 2015-05-11 DIAGNOSIS — R1084 Generalized abdominal pain: Secondary | ICD-10-CM

## 2015-05-11 LAB — SEDIMENTATION RATE: Sed Rate: 81 mm/hr — ABNORMAL HIGH (ref 0–30)

## 2015-05-11 LAB — IRON AND TIBC
Iron: 56 ug/dL (ref 28–170)
Saturation Ratios: 14 % (ref 10.4–31.8)
TIBC: 395 ug/dL (ref 250–450)
UIBC: 339 ug/dL

## 2015-05-11 LAB — CBC WITH DIFFERENTIAL/PLATELET
Basophils Absolute: 0.1 10*3/uL (ref 0–0.1)
Basophils Relative: 1 %
Eosinophils Absolute: 0.1 10*3/uL (ref 0–0.7)
Eosinophils Relative: 2 %
HCT: 37 % (ref 35.0–47.0)
Hemoglobin: 12.1 g/dL (ref 12.0–16.0)
Lymphocytes Relative: 21 %
Lymphs Abs: 1 10*3/uL (ref 1.0–3.6)
MCH: 29 pg (ref 26.0–34.0)
MCHC: 32.8 g/dL (ref 32.0–36.0)
MCV: 88.5 fL (ref 80.0–100.0)
Monocytes Absolute: 0.5 10*3/uL (ref 0.2–0.9)
Monocytes Relative: 10 %
Neutro Abs: 3.3 10*3/uL (ref 1.4–6.5)
Neutrophils Relative %: 66 %
Platelets: 146 10*3/uL — ABNORMAL LOW (ref 150–440)
RBC: 4.18 MIL/uL (ref 3.80–5.20)
RDW: 15.2 % — ABNORMAL HIGH (ref 11.5–14.5)
WBC: 4.9 10*3/uL (ref 3.6–11.0)

## 2015-05-11 LAB — COMPREHENSIVE METABOLIC PANEL
ALT: 19 U/L (ref 0–35)
AST: 36 U/L (ref 0–37)
Albumin: 3.6 g/dL (ref 3.5–5.2)
Alkaline Phosphatase: 163 U/L — ABNORMAL HIGH (ref 39–117)
BUN: 6 mg/dL (ref 6–23)
CHLORIDE: 103 meq/L (ref 96–112)
CO2: 28 meq/L (ref 19–32)
Calcium: 9.2 mg/dL (ref 8.4–10.5)
Creatinine, Ser: 0.75 mg/dL (ref 0.40–1.20)
GFR: 82.5 mL/min (ref >60.00)
GLUCOSE: 202 mg/dL — AB (ref 70–99)
POTASSIUM: 4.3 meq/L (ref 3.5–5.1)
Sodium: 138 mEq/L (ref 135–145)
Total Bilirubin: 0.6 mg/dL (ref 0.2–1.2)
Total Protein: 7.3 g/dL (ref 6.0–8.3)

## 2015-05-11 LAB — VITAMIN B12: Vitamin B-12: 436 pg/mL (ref 180–914)

## 2015-05-11 LAB — FERRITIN: Ferritin: 20 ng/mL (ref 11–307)

## 2015-05-11 LAB — FOLATE: Folate: 16.5 ng/mL (ref >5.9)

## 2015-05-11 LAB — LIPASE: Lipase: 25 U/L (ref 11.0–59.0)

## 2015-05-11 NOTE — Progress Notes (Signed)
[facility] day:  05/11/2015  Chief Complaint: Stephanie [name] is a 64 y.o. female with chronic thrombocytopenia and mild anemia who is seen for 3 month assessment.  HPI: The patient was last seen in the medical oncology clinic on 02/02/2015.  At that time, she was seen for 3 month assessment. Symptomatically, she was fatigued. She had several episodes of falling secondary to general weakness. She denied any bleeding.  She had multiple trigger points (? fibromyalgia).  CBC revealed a hematocrit of 35.0, hemoglobin 11.8, MCV 90.2, platelets 122,000, WBC 5800 with an ANC of 4000.  She was to follow-up with her primary care physician.  During the interim, she notes pain in her right hip (arthritis/bursitis).  She states that she was told that she does not have obvious rheumatoid arthritis (positive rheumatoid factor) by Dr. Jefm Bryant.  She comments that her bowels have been off.  She had 3 loose stools on Saturday and 4 on Sunday (usually every other day).  She has had some abdominal discomfort.  Abdominal ultrasound on 04/28/2015 revealed findings consistent with cirrhosis and associated splenomegaly.  She is status post cholecystectomy with no biliary distention (similar findings on prior exams).  She denies any bruising or bleeding.  She has had no fever.  Past Medical History  Diagnosis Date  . Adenomatous colon polyp   . Insomnia   . RLS (restless legs syndrome)   . Colon polyp   . Incontinence   . H/O: rheumatic fever   . Hyperlipidemia   . Unspecified disorders of nervous system   . Bipolar affective disorder ([place])     h/o  . H/O: CVA (cardiovascular accident)     TIA  . Fatty liver 04/09/08    found in abd CT  . Shortness of breath   . Obstructive sleep apnea     not using CPAP, last sleep study 2.5 years ago, per pt doctor is aware  . Blood transfusion 2000  . GERD (gastroesophageal reflux disease)   . Arthritis   . Aortic dissection  (HCC)     Type 1  . Asthma   . Anxiety   . Depression   . Stroke [facility])     TIA- 2002  . Ulcer   . Diabetes mellitus without complication ([place])   . Anemia   . Blood transfusion without reported diagnosis   . Sleep apnea   . Platelets decreased ([place])   . 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 Back surgery      x 45  . Cholecystectomy    . Rotator cuff repair      left  . Abdominal hysterectomy      total  . Lumbar fusion  10/09  . Hemorroidectomy      and colon polyp removed  . Abdominal exploration surgery    . Posterior cervical fusion/foraminotomy    . Eye surgery      bilat  . Lumbar wound debridement  05/09/2011    Procedure: LUMBAR WOUND DEBRIDEMENT;  Surgeon: Dahlia Bailiff;  Location: [place];  Service: Orthopedics;  Laterality: N/A;  IRRIGATION AND DEBRIDEMENT SPINAL WOUND  . Axillary artery cannulation via 8-mm hemashield graft, median sternotomy, extracorporeal circulation with deep hypothermic circulatory arrest, repair of  aortic dessection  01/23/2009    Dr Arlyce Dice  . Cataract      both eyes  . Colonoscopy    .  Liver biopsy  04/10/2012    Procedure: LIVER BIOPSY;  Surgeon: Inda Castle, MD;  Location: [facility];  Service: Endoscopy;  Laterality: N/A;  ultrasound to mark order for abd limited/liver to be marked to be sent by [email or web address]     Family History  Problem Relation Age of Onset  . Colon cancer Neg Hx   . Esophageal cancer Neg Hx   . Rectal cancer Neg Hx   . Breast cancer Mother   . Lung cancer Mother   . Stomach cancer Father   . Diabetes      4 aunts, and 1 uncle    Social History:  reports that she quit smoking about 2 years ago. Her smoking use included Cigarettes. She has a 25 pack-year smoking history. She has never used smokeless tobacco. She reports that she does not drink alcohol or use illicit drugs.  The patient is alone today.  Allergies:  Allergies  Allergen Reactions  .  Doxycycline Other (See Comments)    Face red and swollen; no difficulty breathing    Current Medications: Current Outpatient Prescriptions  Medication Sig Dispense Refill  . ABILIFY 30 MG tablet TAKE 1 TABLET BY MOUTH EVERY MORNING 30 tablet 0  . clonazePAM (KLONOPIN) 0.5 MG tablet TAKE 1 TABLET BY MOUTH TWICE DAILY 60 tablet 0  . diazepam (VALIUM) 5 MG tablet TAKE 1 TABLET BY MOUTH TWICE A DAY 60 tablet 0  . docusate sodium (COLACE) 100 MG capsule TAKE 1 CAPSULE BY MOUTH 2 TIMES DAILY AS NEEDED FOR CONSTIPATION 60 capsule 3  . gabapentin (NEURONTIN) 100 MG capsule Take 100 mg by mouth at bedtime.     . lamoTRIgine (LAMICTAL) 100 MG tablet Take 3 tablets by mouth once daily    . metFORMIN (GLUCOPHAGE) 1000 MG tablet TAKE 1 TABLET (1,000 MG TOTAL) BY MOUTH 2 (TWO) TIMES DAILY WITH A MEAL. 180 tablet 1  . mirtazapine (REMERON) 15 MG tablet TAKE 1 TABLET BY MOUTH AT BEDTIME 34 tablet 2  . nystatin (MYCOSTATIN) powder Apply topically 2 (two) times daily. 15 g 0  . omeprazole (PRILOSEC) 40 MG capsule TAKE ONE CAPSULE BY MOUTH EVERY DAY (Patient taking differently: TAKE two CAPSULE BY MOUTH EVERY DAY) 90 capsule 3  . ondansetron (ZOFRAN ODT) 4 MG disintegrating tablet Take 1 tablet (4 mg total) by mouth every 8 (eight) hours as needed for nausea or vomiting. 50 tablet 1  . rOPINIRole (REQUIP) 3 MG tablet TAKE 1 TABLET BY MOUTH AT BEDTIME 90 tablet 0  . simvastatin (ZOCOR) 10 MG tablet TAKE 1 TABLET BY MOUTH AT BEDTIME 90 tablet 1  . SYMBICORT 160-4.5 MCG/ACT inhaler INHALE 2 PUFFS INTO THE LUNGS 2 (TWO) TIMES DAILY. 10.2 Inhaler 0  . tolterodine (DETROL LA) 2 MG 24 hr capsule TAKE ONE CAPSULE BY MOUTH DAILY 90 capsule 0  . traMADol (ULTRAM) 50 MG tablet TAKE 2 TABLETS BY MOUTH EVERY MORNING AND 2 TABLETS BY MOUTH EVERY EVENING 120 tablet 0  . zolpidem (AMBIEN) 5 MG tablet TAKE 1 TABLET BY MOUTH AT BEDTIME 30 tablet 0   No current facility-administered medications for this visit.    Review of  Systems:  GENERAL:  Feels weak.  No fevers, sweats or weight loss. PERFORMANCE STATUS (ECOG):  1 HEENT:  No visual changes, runny nose, sore throat, mouth sores or tenderness. Lungs: No shortness of breath or cough.  No hemoptysis. Cardiac:  No chest pain, palpitations, orthopnea, or PND. GI:  Interval abdominal pain.  Loose stools.  No nausea, vomiting, constipation, melena or hematochezia. GU:  No urgency, frequency, dysuria, or hematuria. Musculoskeletal:  Right hip bursitis/arthritis.  No back pain.  No muscle tenderness. Extremities:  No pain or swelling. Skin:  Lots of yeast infections.  No rashes or skin changes. Neuro:  No focal weakness.  No seizures.  No headache, numbness or weakness, balance or coordination issues. Endocrine:  Diabetes.  No thyroid issues, hot flashes or night sweats. Psych:  No mood changes, depression or anxiety. Pain:  No focal pain. Review of systems:  All other systems reviewed and found to be negative.  Physical Exam: Blood pressure 136/79, pulse 98, temperature 96.6 F (35.9 C), temperature source Tympanic, resp. rate 18, height 5' 3"  (1.6 m), weight 221 lb 9 oz (100.5 kg). GENERAL:  Well developed, well nourished, sitting comfortably in the exam room in no acute distress. MENTAL STATUS:  Alert and oriented to person, place and time. HEAD:  Short gray hair.  Normocephalic, atraumatic, face symmetric, no Cushingoid features. EYES:  Blue eyes s/p cataract surgery.  Pupils equal round and reactive to light and accomodation.  No conjunctivitis or scleral icterus. ENT:  Oropharynx clear without lesion.  Dentures.  Tongue normal. Mucous membranes moist.  RESPIRATORY:  Clear to auscultation without rales, wheezes or rhonchi. CARDIOVASCULAR:  Regular rate and rhythm without murmur, rub or gallop. ABDOMEN:  Soft, fully round, with active bowel sounds, and no appreciable hepatosplenomegaly.  No guarding or rebound tenderness.  No masses. SKIN:  No rashes, ulcers  or lesions. EXTREMITIES: Tender at multiple trigger points.  No edema, no skin discoloration or tenderness.  No palpable cords. LYMPH NODES: No palpable cervical, supraclavicular, axillary or inguinal adenopathy  NEUROLOGICAL: Unremarkable. PSYCH:  Appropriate.  Tearful at times.  Appointment on 05/11/2015  Component Date Value Ref Range Status  . WBC 05/11/2015 4.9  3.6 - 11.0 K/uL Final  . RBC 05/11/2015 4.18  3.80 - 5.20 MIL/uL Final  . Hemoglobin 05/11/2015 12.1  12.0 - 16.0 g/dL Final  . HCT 05/11/2015 37.0  35.0 - 47.0 % Final  . MCV 05/11/2015 88.5  80.0 - 100.0 fL Final  . MCH 05/11/2015 29.0  26.0 - 34.0 pg Final  . MCHC 05/11/2015 32.8  32.0 - 36.0 g/dL Final  . RDW 05/11/2015 15.2* 11.5 - 14.5 % Final  . Platelets 05/11/2015 146* 150 - 440 K/uL Final  . Neutrophils Relative % 05/11/2015 66   Final  . Neutro Abs 05/11/2015 3.3  1.4 - 6.5 K/uL Final  . Lymphocytes Relative 05/11/2015 21   Final  . Lymphs Abs 05/11/2015 1.0  1.0 - 3.6 K/uL Final  . Monocytes Relative 05/11/2015 10   Final  . Monocytes Absolute 05/11/2015 0.5  0.2 - 0.9 K/uL Final  . Eosinophils Relative 05/11/2015 2   Final  . Eosinophils Absolute 05/11/2015 0.1  0 - 0.7 K/uL Final  . Basophils Relative 05/11/2015 1   Final  . Basophils Absolute 05/11/2015 0.1  0 - 0.1 K/uL Final  . Ferritin 05/11/2015 20  11 - 307 ng/mL Final  . Iron 05/11/2015 56  28 - 170 ug/dL Final  . TIBC 05/11/2015 395  250 - 450 ug/dL Final  . Saturation Ratios 05/11/2015 14  10.4 - 31.8 % Final  . UIBC 05/11/2015 339   Final  . Sed Rate 05/11/2015 81* 0 - 30 mm/hr Final  . Folate 05/11/2015 16.5  >5.9 ng/mL Final    Assessment:  AALIJAH MIMS is a 64 y.o. female  with chronic mild thrombocytopenia dating back to 12/2013. Platelets have ranged between 108,000 and 128,000 without trend. She denies any new medications or herbal products.  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  Work-up on 08/29/2014 revealed a positive rheumatoid factor and ANA (anti-double-stranded DNA of 5- equivocal) Negative studies included hepatitis B surface antibody and antigen, hepatitis C testing, and HIV testing. B12, SPEP, and free light chains were normal.  Additional testing on 10/31/2014 revealed negative hepatitis B core antibody and CMV IgM.  Abdominal ultrasound on 06/27/2014 revealed mild splenomegaly (14.2cm) with moderate cirrhosis and no focal liver lesions. AFP was normal (4) on 04/21/2015.  She denies any alcohol use.  She has had a mild normocytic anemia. Ferritin was 75 on 09/29/2014 and 20 on 05/11/2015. Iron saturation was 6%. Reticulocyte count was 2.42%. Coombs was negative. Last colonoscopy was in 08/2014.  Diet is poor.  Symptomatically, she remains fatigued. She has some abdominal discomfort and loose stools.  Hematocrit is normal.  Plan: 1.  Labs today:  CBC with diff. 2.  Discuss follow-up with PCP 3.  RTC in 6 months for MD assessment and labs (CBC with diff +/- others)   Lequita Asal, MD  05/11/2015, 10:37 AM

## 2015-05-11 NOTE — Progress Notes (Signed)
Subjective:    Patient ID: Stephanie [name], female    DOB: 1951/01/19, 64 y.o.   MRN: [id number]  HPI  Stephanie [name] is a 64 year old female who presents today with a chief complaint of abdominal pain. She also reports diarrhea, abdominal bloating, and nausea. She's had multiple episodes of diarrhea since Friday. She had a low grade fever Saturday evening. She has a rather lengthy abdominal history of GERD, Cirrhosis, chronic abdominal pain, and Type 2 diabetes. Her pain is located to the epigastric region, RUQ, LUQ abdominal pain. She was evaluated by her oncologist this morning who told her she needed evaluation. Her symptoms have been present for 2 weeks. She completed an abdominal ultrasound on 04/28/15 and a CT abdomen pelvis in January 2016. Her [name] from November 2016 did not show any acute process. CBC from 05/11/15 without signs of infection. Her gallbladder has been surgically removed. Kidneys were NL. Denies blood or mucous in her stool. No evidence of diverticulitis on recent colonoscopy.  Review of Systems  Constitutional: Positive for fever and chills.  Respiratory: Negative for shortness of breath.   Cardiovascular: Negative for chest pain.  Gastrointestinal: Positive for nausea, abdominal pain and diarrhea. Negative for vomiting.  Genitourinary: Negative for dysuria and flank pain.  Neurological: Negative for dizziness.       Past Medical History  Diagnosis Date  . Adenomatous colon polyp   . Insomnia   . RLS (restless legs syndrome)   . Colon polyp   . Incontinence   . H/O: rheumatic fever   . Hyperlipidemia   . Unspecified disorders of nervous system   . Bipolar affective disorder ([place])     h/o  . H/O: CVA (cardiovascular accident)     TIA  . Fatty liver 04/09/08    found in abd CT  . Shortness of breath   . Obstructive sleep apnea     not using CPAP, last sleep study 2.5 years ago, per pt doctor is aware  . Blood transfusion 2000  . GERD (gastroesophageal reflux  disease)   . Arthritis   . 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 Asthma   . Anxiety   . Depression   . Stroke [facility])     TIA- 2002  . Ulcer   . Diabetes mellitus without complication ([place])   . Anemia   . Blood transfusion without reported diagnosis   . Sleep apnea   . Platelets decreased ([place])   . 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 Marital Status: Single    Spouse Name: N/A  . Number of Children: 1  . Years of Education: N/A   Occupational History  . Disabled    Social History Main Topics  . Smoking status: Former Smoker -- 0.50 packs/day for 50 years    Types: Cigarettes    Quit date: 04/06/2013  . Smokeless tobacco: Never Used  . Alcohol Use: No  . Drug Use: No  . Sexual Activity: Not on file   Other Topics Concern  . Not on file   Social History Narrative   1 son, 49+ y/o   Daily caffeine use: 2 cups daily   Does not get regular exercise   Disability due to bipolar      Has a living will- she is a DNR (she has form at home per pt).    Past Surgical History  Procedure Laterality Date  . Back surgery      x 45  . Cholecystectomy    . Rotator cuff repair      left  . Abdominal hysterectomy      total  . Lumbar fusion  10/09  . Hemorroidectomy      and colon polyp removed  . Abdominal exploration surgery    . Posterior cervical fusion/foraminotomy    . Eye surgery      bilat  . Lumbar wound debridement  05/09/2011    Procedure: LUMBAR WOUND DEBRIDEMENT;  Surgeon: Dahlia Bailiff;  Location: [place];  Service: Orthopedics;  Laterality: N/A;  IRRIGATION AND DEBRIDEMENT SPINAL WOUND  . Axillary artery cannulation via 8-mm hemashield graft, median sternotomy, extracorporeal circulation with deep hypothermic circulatory arrest, repair of  aortic dessection  01/23/2009    Dr Arlyce Dice  . Cataract      both eyes  . Colonoscopy    . Liver biopsy  04/10/2012    Procedure: LIVER BIOPSY;  Surgeon: Inda Castle, MD;   [facility];  Service: Endoscopy;  Laterality: N/A;  ultrasound to mark order for abd limited/liver to be marked to be sent by [email or web address]     Family History  Problem Relation Age of Onset  . Colon cancer Neg Hx   . Esophageal cancer Neg Hx   . Rectal cancer Neg Hx   . Breast cancer Mother   . Lung cancer Mother   . Stomach cancer Father   . Diabetes      4 aunts, and 1 uncle    Allergies  Allergen Reactions  . Doxycycline Other (See Comments)    Face red and swollen; no difficulty breathing    Current Outpatient Prescriptions on File Prior to Visit  Medication Sig Dispense Refill  . ABILIFY 30 MG tablet TAKE 1 TABLET BY MOUTH EVERY MORNING 30 tablet 0  . clonazePAM (KLONOPIN) 0.5 MG tablet TAKE 1 TABLET BY MOUTH TWICE DAILY 60 tablet 0  . diazepam (VALIUM) 5 MG tablet TAKE 1 TABLET BY MOUTH TWICE A DAY 60 tablet 0  . docusate sodium (COLACE) 100 MG capsule TAKE 1 CAPSULE BY MOUTH 2 TIMES DAILY AS NEEDED FOR CONSTIPATION 60 capsule 3  . gabapentin (NEURONTIN) 100 MG capsule Take 100 mg by mouth at bedtime.     . lamoTRIgine (LAMICTAL) 100 MG tablet Take 3 tablets by mouth once daily    . metFORMIN (GLUCOPHAGE) 1000 MG tablet TAKE 1 TABLET (1,000 MG TOTAL) BY MOUTH 2 (TWO) TIMES DAILY WITH A MEAL. 180 tablet 1  . mirtazapine (REMERON) 15 MG tablet TAKE 1 TABLET BY MOUTH AT BEDTIME 34 tablet 2  . nystatin (MYCOSTATIN) powder Apply topically 2 (two) times daily. 15 g 0  . omeprazole (PRILOSEC) 40 MG capsule TAKE ONE CAPSULE BY MOUTH EVERY DAY (Patient taking differently: TAKE two CAPSULE BY MOUTH EVERY DAY) 90 capsule 3  . ondansetron (ZOFRAN ODT) 4 MG disintegrating tablet Take 1 tablet (4 mg total) by mouth every 8 (eight) hours as needed for nausea or vomiting. 50 tablet 1  . rOPINIRole (REQUIP) 3 MG tablet TAKE 1 TABLET BY MOUTH AT BEDTIME 90 tablet 0  . simvastatin (ZOCOR) 10 MG tablet TAKE 1 TABLET BY MOUTH AT BEDTIME 90 tablet 1  . SYMBICORT 160-4.5 MCG/ACT inhaler  INHALE 2 PUFFS INTO THE LUNGS 2 (TWO) TIMES DAILY. 10.2 Inhaler 0  . tolterodine (DETROL LA) 2 MG 24 hr capsule TAKE ONE CAPSULE BY MOUTH DAILY 90  capsule 0  . traMADol (ULTRAM) 50 MG tablet TAKE 2 TABLETS BY MOUTH EVERY MORNING AND 2 TABLETS BY MOUTH EVERY EVENING 120 tablet 0  . zolpidem (AMBIEN) 5 MG tablet TAKE 1 TABLET BY MOUTH AT BEDTIME 30 tablet 0   No current facility-administered medications on file prior to visit.    BP 136/82 mmHg  Pulse 96  Temp(Src) 98.8 F (37.1 C) (Oral)  Ht 5' 3"  (1.6 m)  Wt 220 lb 1.9 oz (99.846 kg)  BMI 39.00 kg/m2  SpO2 94%    Objective:   Physical Exam  Constitutional: She appears well-nourished. She does not have a sickly appearance.  Cardiovascular: Normal rate and regular rhythm.   Pulmonary/Chest: Effort normal and breath sounds normal.  Abdominal: Soft. Bowel sounds are normal. There is generalized tenderness. There is no rigidity, no rebound, no guarding and no CVA tenderness.  Skin: Skin is warm and dry.          Assessment & Plan:

## 2015-05-11 NOTE — Patient Instructions (Signed)
Complete lab work prior to leaving today. I will notify you of your results.  You may take Imodium as needed for diarrhea.  Ensure you are staying hydrated with water. Please follow up with GI if no improvement. If your pain becomes worse over night then go to the Emergency Department.   It was a pleasure meeting you!

## 2015-05-12 ENCOUNTER — Emergency Department: Payer: Commercial Managed Care - HMO

## 2015-05-12 ENCOUNTER — Emergency Department
Admission: EM | Admit: 2015-05-12 | Discharge: 2015-05-12 | Disposition: A | Discharge status: Home / Self Care | Payer: Commercial Managed Care - HMO | Attending: Emergency Medicine | Admitting: Emergency Medicine

## 2015-05-12 ENCOUNTER — Encounter: Payer: Self-pay | Admitting: Emergency Medicine

## 2015-05-12 ENCOUNTER — Other Ambulatory Visit: Payer: Self-pay

## 2015-05-12 ENCOUNTER — Telehealth: Payer: Self-pay | Admitting: Gastroenterology

## 2015-05-12 DIAGNOSIS — Z792 Long term (current) use of antibiotics: Secondary | ICD-10-CM | POA: Insufficient documentation

## 2015-05-12 DIAGNOSIS — Z7984 Long term (current) use of oral hypoglycemic drugs: Secondary | ICD-10-CM | POA: Diagnosis not present

## 2015-05-12 DIAGNOSIS — R42 Dizziness and giddiness: Secondary | ICD-10-CM | POA: Diagnosis not present

## 2015-05-12 DIAGNOSIS — K921 Melena: Secondary | ICD-10-CM | POA: Insufficient documentation

## 2015-05-12 DIAGNOSIS — Z87891 Personal history of nicotine dependence: Secondary | ICD-10-CM | POA: Diagnosis not present

## 2015-05-12 DIAGNOSIS — R531 Weakness: Secondary | ICD-10-CM | POA: Insufficient documentation

## 2015-05-12 DIAGNOSIS — R1010 Upper abdominal pain, unspecified: Secondary | ICD-10-CM | POA: Diagnosis present

## 2015-05-12 DIAGNOSIS — Z79899 Other long term (current) drug therapy: Secondary | ICD-10-CM | POA: Diagnosis not present

## 2015-05-12 DIAGNOSIS — R110 Nausea: Secondary | ICD-10-CM | POA: Insufficient documentation

## 2015-05-12 DIAGNOSIS — E119 Type 2 diabetes mellitus without complications: Secondary | ICD-10-CM | POA: Insufficient documentation

## 2015-05-12 DIAGNOSIS — I10 Essential (primary) hypertension: Secondary | ICD-10-CM | POA: Insufficient documentation

## 2015-05-12 LAB — URINALYSIS COMPLETE WITH MICROSCOPIC (ARMC ONLY)
Bilirubin Urine: NEGATIVE
GLUCOSE, UA: 50 mg/dL — AB
HGB URINE DIPSTICK: NEGATIVE
KETONES UR: NEGATIVE mg/dL
NITRITE: NEGATIVE
Protein, ur: NEGATIVE mg/dL
SPECIFIC GRAVITY, URINE: 1.01 (ref 1.005–1.030)
pH: 6 (ref 5.0–8.0)

## 2015-05-12 LAB — TYPE AND SCREEN
ABO/RH(D): AB POS
Antibody Screen: POSITIVE

## 2015-05-12 LAB — CBC
HCT: 33.5 % — ABNORMAL LOW (ref 35.0–47.0)
HEMOGLOBIN: 10.8 g/dL — AB (ref 12.0–16.0)
MCH: 28.6 pg (ref 26.0–34.0)
MCHC: 32.3 g/dL (ref 32.0–36.0)
MCV: 88.5 fL (ref 80.0–100.0)
PLATELETS: 132 10*3/uL — AB (ref 150–440)
RBC: 3.79 MIL/uL — AB (ref 3.80–5.20)
RDW: 15.2 % — ABNORMAL HIGH (ref 11.5–14.5)
WBC: 4.1 10*3/uL (ref 3.6–11.0)

## 2015-05-12 LAB — COMPREHENSIVE METABOLIC PANEL
ALK PHOS: 150 U/L — AB (ref 38–126)
ALT: 18 U/L (ref 14–54)
ANION GAP: 7 (ref 5–15)
AST: 60 U/L — ABNORMAL HIGH (ref 15–41)
Albumin: 3.2 g/dL — ABNORMAL LOW (ref 3.5–5.0)
BILIRUBIN TOTAL: 0.8 mg/dL (ref 0.3–1.2)
BUN: 8 mg/dL (ref 6–20)
CALCIUM: 9.1 mg/dL (ref 8.9–10.3)
CO2: 26 mmol/L (ref 22–32)
CREATININE: 0.78 mg/dL (ref 0.44–1.00)
Chloride: 104 mmol/L (ref 101–111)
Glucose, Bld: 240 mg/dL — ABNORMAL HIGH (ref 65–99)
Potassium: 3.8 mmol/L (ref 3.5–5.1)
Sodium: 137 mmol/L (ref 135–145)
TOTAL PROTEIN: 7 g/dL (ref 6.5–8.1)

## 2015-05-12 LAB — LIPASE, BLOOD: Lipase: 25 U/L (ref 11–51)

## 2015-05-12 LAB — ABO/RH: ABO/RH(D): AB POS

## 2015-05-12 MED ORDER — ONDANSETRON HCL 4 MG/2ML IJ SOLN
4.0000 mg | Freq: Once | INTRAMUSCULAR | Status: AC
  Administered 2015-05-12: 4 mg via INTRAVENOUS
  Filled 2015-05-12: qty 2

## 2015-05-12 MED ORDER — METOCLOPRAMIDE HCL 5 MG/ML IJ SOLN
10.0000 mg | Freq: Once | INTRAMUSCULAR | Status: AC
  Administered 2015-05-12: 10 mg via INTRAVENOUS
  Filled 2015-05-12: qty 2

## 2015-05-12 MED ORDER — HYDROMORPHONE HCL 1 MG/ML IJ SOLN
0.5000 mg | INTRAMUSCULAR | Status: DC | PRN
  Administered 2015-05-12: 0.5 mg via INTRAVENOUS
  Filled 2015-05-12: qty 1

## 2015-05-12 MED ORDER — IOHEXOL 240 MG/ML SOLN
25.0000 mL | Freq: Once | INTRAMUSCULAR | Status: AC | PRN
  Administered 2015-05-12: 25 mL via ORAL

## 2015-05-12 MED ORDER — IOHEXOL 300 MG/ML  SOLN
100.0000 mL | Freq: Once | INTRAMUSCULAR | Status: AC | PRN
  Administered 2015-05-12: 100 mL via INTRAVENOUS

## 2015-05-12 MED ORDER — METOCLOPRAMIDE HCL 5 MG PO TABS: 5.0000 mg | ORAL_TABLET | Freq: Three times a day (TID) | ORAL | Status: DC

## 2015-05-12 NOTE — ED Notes (Addendum)
Pt states she has had diarrhea x 2 weeks, states now diarrhea has improved and she is having dark black stools, also c/o epigastric pain, dizziness, and nausea, states she has had a "bleeding ulcer" in the past

## 2015-05-12 NOTE — ED Provider Notes (Signed)
[facility] Emergency Department Provider Note  ____________________________________________  Time seen: 1205  I have reviewed the triage vital signs and the nursing notes.  History by:  Patient, with assistance from friend  HISTORY  Chief Complaint Dizziness; Melena; and Nausea     HPI Stephanie [name] is a 64 y.o. female who reports she is having upper abdominal pain. She had diarrhea approximately 2 weeks ago. This resolved, but she is now having melena. In addition to the abdominal pain and melena, she reports occasional weakness and dizziness.  She reports she had a bleeding ulcer approximately 2-3 years ago. This was treated at [facility]. Her primary physician is Dr. Marjory Lies.  [facility] her known thrombocytopenia. Blood tests were obtained then. She was also seen in Dr. Rip Harbour office by one of Dr. Rip Harbour colleagues.  The patient now presents emergency department due to worsening discomfort. She has nausea and retches occasionally, but reports she is unable to actually vomit. She denies any current diarrhea. The pain is primarily in her upper abdomen. It is diffuse and equal on both sides.  Patient does report she had an ultrasound recently, a couple of weeks ago, that was negative.    Past Medical History  Diagnosis Date  . Adenomatous colon polyp   . Insomnia   . RLS (restless legs syndrome)   . Colon polyp   . Incontinence   . H/O: rheumatic fever   . Hyperlipidemia   . Unspecified disorders of nervous system   . Bipolar affective disorder ([place])     h/o  . H/O: CVA (cardiovascular accident)     TIA  . Fatty liver 04/09/08    found in abd CT  . Shortness of breath   . Obstructive sleep apnea     not using CPAP, last sleep study 2.5 years ago, per pt doctor is aware  . Blood transfusion 2000  . GERD (gastroesophageal reflux disease)   . Arthritis   . Aortic dissection (HCC)     Type 1   . Asthma   . Anxiety   . Depression   . Stroke [facility])     TIA- 2002  . Ulcer   . Diabetes mellitus without complication ([place])   . Anemia   . Blood transfusion without reported diagnosis   . Sleep apnea   . Platelets decreased ([place])   . 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 Accidental fall from chair 03/30/2015  . Chest pain 02/25/2015  . Myalgia and myositis 02/11/2015  . Yeast infection 12/15/2014  . Intertrigo 12/15/2014  . Thrombocytopenia ([place]) 08/19/2014  . IGTN (ingrowing toe nail) 08/19/2014  . Abdominal pain, chronic, epigastric 06/12/2014  . Elevated blood pressure 03/27/2014  . Liver cirrhosis secondary to NASH 02/27/2014  . Abnormal chest CT 02/21/2014  . Chest wall pain 12/29/2013  . Dizziness and giddiness 10/30/2013  . DOE (dyspnea on exertion) 10/30/2013  . Nausea alone 10/30/2013  . Diabetes mellitus type 2, controlled, with complications ([place]) 63/78/5885  . DNR (do not resuscitate) 07/29/2013  . Encounter for routine gynecological examination 07/29/2013  . Dyspnea 07/10/2013  . Lung nodule 07/10/2013  . Right foot pain 05/20/2013  . Varicose veins of lower extremities with other complications 02/77/4128  . GERD (gastroesophageal reflux disease) 10/09/2012  . Cirrhosis of liver ([place]) 02/02/2012  . Anxiety 07/11/2011  .  Aortic dissection ([place])   . BACK PAIN, LUMBAR, CHRONIC 11/19/2009  . UNS ADVRS EFF OTH RX MEDICINAL&BIOLOGICAL SBSTNC 12/16/2008  . UNSPECIFIED VITAMIN D DEFICIENCY 09/26/2008  . TOBACCO ABUSE 06/19/2008  . RESTLESS LEGS SYNDROME 04/27/2007  . INSOMNIA 04/27/2007  . Benign neoplasm of colon 04/04/2007  . HLD (hyperlipidemia) 03/08/2007  . OSA (obstructive sleep apnea) 03/08/2007  . Essential hypertension 03/08/2007  . Asthma 03/08/2007  . BIPOLAR AFFECTIVE DISORDER, HX OF 03/08/2007    Past Surgical History  Procedure Laterality Date  . Back surgery      x 45  .  Cholecystectomy    . Rotator cuff repair      left  . Abdominal hysterectomy      total  . Lumbar fusion  10/09  . Hemorroidectomy      and colon polyp removed  . Abdominal exploration surgery    . Posterior cervical fusion/foraminotomy    . Eye surgery      bilat  . Lumbar wound debridement  05/09/2011    Procedure: LUMBAR WOUND DEBRIDEMENT;  Surgeon: Dahlia Bailiff;  Location: [place];  Service: Orthopedics;  Laterality: N/A;  IRRIGATION AND DEBRIDEMENT SPINAL WOUND  . Axillary artery cannulation via 8-mm hemashield graft, median sternotomy, extracorporeal circulation with deep hypothermic circulatory arrest, repair of  aortic dessection  01/23/2009    Dr Arlyce Dice  . Cataract      both eyes  . Colonoscopy    . Liver biopsy  04/10/2012    Procedure: LIVER BIOPSY;  Surgeon: Inda Castle, MD;  Location: [facility];  Service: Endoscopy;  Laterality: N/A;  ultrasound to mark order for abd limited/liver to be marked to be sent by [email or web address]     Current Outpatient Rx  Name  Route  Sig  Dispense  Refill  . ABILIFY 30 MG tablet      TAKE 1 TABLET BY MOUTH EVERY MORNING   30 tablet   0   . clonazePAM (KLONOPIN) 0.5 MG tablet      TAKE 1 TABLET BY MOUTH TWICE DAILY   60 tablet   0     Not to exceed 5 additional fills before 08/10/2015 ...   . diazepam (VALIUM) 5 MG tablet      TAKE 1 TABLET BY MOUTH TWICE A DAY   60 tablet   0     Not to exceed 5 additional fills before 06/13/2015 ...   . docusate sodium (COLACE) 100 MG capsule      TAKE 1 CAPSULE BY MOUTH 2 TIMES DAILY AS NEEDED FOR CONSTIPATION   60 capsule   3   . gabapentin (NEURONTIN) 100 MG capsule   Oral   Take 100 mg by mouth at bedtime.          . lamoTRIgine (LAMICTAL) 100 MG tablet      Take 3 tablets by mouth once daily         . metFORMIN (GLUCOPHAGE) 1000 MG tablet      TAKE 1 TABLET (1,000 MG TOTAL) BY MOUTH 2 (TWO) TIMES DAILY WITH A MEAL.   180 tablet   1   . metoCLOPramide (REGLAN) 5  MG tablet   Oral   Take 1 tablet (5 mg total) by mouth 3 (three) times daily.   15 tablet   1   . mirtazapine (REMERON) 15 MG tablet      TAKE 1 TABLET BY MOUTH AT BEDTIME   34 tablet   2   . nystatin (  MYCOSTATIN) powder   Topical   Apply topically 2 (two) times daily.   15 g   0   . omeprazole (PRILOSEC) 40 MG capsule      TAKE ONE CAPSULE BY MOUTH EVERY DAY Patient taking differently: TAKE two CAPSULE BY MOUTH EVERY DAY   90 capsule   3   . ondansetron (ZOFRAN ODT) 4 MG disintegrating tablet   Oral   Take 1 tablet (4 mg total) by mouth every 8 (eight) hours as needed for nausea or vomiting.   50 tablet   1   . rOPINIRole (REQUIP) 3 MG tablet      TAKE 1 TABLET BY MOUTH AT BEDTIME   90 tablet   0   . simvastatin (ZOCOR) 10 MG tablet      TAKE 1 TABLET BY MOUTH AT BEDTIME   90 tablet   1     NEEDS REFILLS   . SYMBICORT 160-4.5 MCG/ACT inhaler      INHALE 2 PUFFS INTO THE LUNGS 2 (TWO) TIMES DAILY.   10.2 Inhaler   0     Patient must schedule office visit before future r ...   . tolterodine (DETROL LA) 2 MG 24 hr capsule      TAKE ONE CAPSULE BY MOUTH DAILY   90 capsule   0   . traMADol (ULTRAM) 50 MG tablet      TAKE 2 TABLETS BY MOUTH EVERY MORNING AND 2 TABLETS BY MOUTH EVERY EVENING   120 tablet   0     Not to exceed 5 additional fills before 08/31/2015 ...   . zolpidem (AMBIEN) 5 MG tablet      TAKE 1 TABLET BY MOUTH AT BEDTIME   30 tablet   0     Not to exceed 5 additional fills before 09/15/2015 ...     Allergies Doxycycline  Family History  Problem Relation Age of Onset  . Colon cancer Neg Hx   . Esophageal cancer Neg Hx   . Rectal cancer Neg Hx   . Breast cancer Mother   . Lung cancer Mother   . Stomach cancer Father   . Diabetes      4 aunts, and 1 uncle    Social History Social History  Substance Use Topics  . Smoking status: Former Smoker -- 0.50 packs/day for 50 years    Types: Cigarettes    Quit date:  04/06/2013  . Smokeless tobacco: Never Used  . Alcohol Use: No    Review of Systems  Constitutional: Negative for fever/chills. Positive for general weakness and some dizziness. See history of present illness ENT: Negative for congestion. Cardiovascular: Negative for chest pain. Respiratory: Negative for cough. Gastrointestinal: Positive for abdominal pain and now with melena. See history of present illness Genitourinary: Negative for dysuria. Musculoskeletal: No back pain. Skin: Negative for rash. Neurological: Negative for headache or focal weakness   10-point ROS otherwise negative.  ____________________________________________   PHYSICAL EXAM:  VITAL SIGNS: ED Triage Vitals  Enc Vitals Group     BP 05/12/15 1147 124/76 mmHg     Pulse Rate 05/12/15 1147 95     Resp 05/12/15 1147 18     Temp 05/12/15 1147 97.9 F (36.6 C)     Temp Source 05/12/15 1147 Oral     SpO2 05/12/15 1147 100 %     Weight 05/12/15 1147 220 lb (99.791 kg)     Height 05/12/15 1147 5' 3"  (1.6 m)  Head Cir --      Peak Flow --      Pain Score 05/12/15 1148 9     Pain Loc --      Pain Edu? --      Excl. in [place]? --     Constitutional: Alert and oriented. Overall well appearing and in no distress. ENT   Head: Normocephalic and atraumatic.   Nose: No congestion/rhinnorhea.       Mouth: No erythema, no swelling   Cardiovascular: Normal rate, regular rhythm, no murmur noted Respiratory:  Normal respiratory effort, no tachypnea.    Breath sounds are clear and equal bilaterally.  Gastrointestinal: Soft, no distention. Notable tenderness across the upper abdomen. Very mild tenderness in the lower abdomen. Normal bowel sounds. Rectal: Normal rectal exam, nontender, heme-negative. No melena. Back: No muscle spasm, no tenderness, no CVA tenderness. Musculoskeletal: No deformity noted. Nontender with normal range of motion in all extremities.  No noted edema. Neurologic:  Communicative. Normal  appearing spontaneous movement in all 4 extremities. No gross focal neurologic deficits are appreciated.  Skin:  Skin is warm, dry. No rash noted. Psychiatric: Mood and affect are normal. Speech and behavior are normal.  ____________________________________________    LABS (pertinent positives/negatives)  Labs Reviewed  COMPREHENSIVE METABOLIC PANEL - Abnormal; Notable for the following:    Glucose, Bld 240 (*)    Albumin 3.2 (*)    AST 60 (*)    Alkaline Phosphatase 150 (*)    All other components within normal limits  CBC - Abnormal; Notable for the following:    RBC 3.79 (*)    Hemoglobin 10.8 (*)    HCT 33.5 (*)    RDW 15.2 (*)    Platelets 132 (*)    All other components within normal limits  URINALYSIS COMPLETEWITH MICROSCOPIC ([facility] ONLY) - Abnormal; Notable for the following:    Color, Urine YELLOW (*)    APPearance HAZY (*)    Glucose, UA 50 (*)    Leukocytes, UA 1+ (*)    Bacteria, UA RARE (*)    Squamous Epithelial / LPF 6-30 (*)    All other components within normal limits  LIPASE, BLOOD  TYPE AND SCREEN  ABO/RH     ____________________________________________   EKG  ED ECG REPORT I, Paislee Szatkowski W, the attending physician, personally viewed and interpreted this ECG.   Date: 05/12/2015  EKG Time: 11:57 AM  Rate: 88  Rhythm:  Normal sinus rhythm  Axis: Normal  Intervals: Normal  ST&T Change: None noted   ____________________________________________    RADIOLOGY  Abdominal series:  IMPRESSION: Moderate stool in colon. Bowel gas pattern unremarkable without obstruction or free air. No lung edema or consolidation.  CT abdomen: IMPRESSION: 1. No acute finding. 2. Cirrhosis and portal hypertension  ____________________________________________   PROCEDURES   ____________________________________________   INITIAL IMPRESSION / ASSESSMENT AND PLAN / ED COURSE  Pertinent labs & imaging results that were available during my care of the  patient were reviewed by me and considered in my medical decision making (see chart for details).  Patient with history of prior bleeding ulcer, now with melena. She has pain across her upper abdomen. The patient's friend tells me that the patient had blood test recently that showed she had "an infection".  I expect this means she had a higher white blood cell count. With this in mind we will consider doing a CT, but we'll begin with a plain x-ray and lab tests.  ----------------------------------------- 1:46 PM  on 05/12/2015 -----------------------------------------  Plain x-ray shows notable stool but a nonobstructive pattern. The patient's blood work is notable for a low to normal white blood cell count at 4000 area she has an AST of 60 and alkaline phosphatase slightly elevated but overall normal liver enzymes and electrolytes. Her platelets are in a stable range.  We will pursue a CT scan for further evaluation.  ----------------------------------------- 4:26 PM on 05/12/2015 -----------------------------------------  CT is negative for acute findings. She does have stool present. She is status post cholecystectomy.  On reexamination, the patient is continuing to have some nausea.  At this time, we performed a rectal exam which was heme negative. This is contrary to my earlier expectations, with her reports of dark stool.  The patient's friend no tells me that the patient took some Imodium.   Her hemoglobin is 10.8. This is down a little bit from 12.1 yesterday. He is unsure if this is dilutional. With the heme-negative stool, I do not think she is having a GI bleed.   We will treat her with Reglan for nausea. She improves, we will consider discharge home with close follow-up with her primary physician.   ----------------------------------------- 5:12 PM on 05/12/2015 -----------------------------------------  The patient does report she had some diarrhea, which is why she took  the Imodium recently. She does have stool in the colon on both abdominal x-ray and CT. I do wonder if she may have some hardened stool, constipation, with fluid leaking around. I discussed this with the patient.   On reexam, the patient reports Reglan seems to helped her pain quite a bit. She still has little bit of nausea. Overall, she feels comfortable with discharge. We will prescribe Reglan and asked her to follow-up Dr. Marjory Lies.  ----------------------------------------- 5:44 PM on 05/12/2015 -----------------------------------------  Patient is pain-free and without nausea. She feels much better. She is appreciative of care. We've outlined the use of Reglan. She also has Phenergan at home that she can take as needed.  ___________________________________________   FINAL CLINICAL IMPRESSION(S) / ED DIAGNOSES  Final diagnoses:  Upper abdominal pain  Nausea      Ahmed Prima, MD 05/12/15 1746

## 2015-05-12 NOTE — Telephone Encounter (Signed)
Left a message for patient to call back. 

## 2015-05-12 NOTE — Discharge Instructions (Signed)
It is unclear what has caused her pain and nausea. Your CT scan overall looks good. You do have some notable stool, despite having had diarrhea recently. You may have some fluid leaking around hardened stool.  Take Reglan for nausea. This may also help with your pain. Follow-up with your regular doctor this week. Return to the emergency department if you're pain worsens, if you have further nausea, or if you have other urgent concerns.  Abdominal Pain, Adult Many things can cause abdominal pain. Usually, abdominal pain is not caused by a disease and will improve without treatment. It can often be observed and treated at home. Your health care provider will do a physical exam and possibly order blood tests and X-rays to help determine the seriousness of your pain. However, in many cases, more time must pass before a clear cause of the pain can be found. Before that point, your health care provider may not know if you need more testing or further treatment. HOME CARE INSTRUCTIONS Monitor your abdominal pain for any changes. The following actions may help to alleviate any discomfort you are experiencing:  Only take over-the-counter or prescription medicines as directed by your health care provider.  Do not take laxatives unless directed to do so by your health care provider.  Try a clear liquid diet (broth, tea, or water) as directed by your health care provider. Slowly move to a bland diet as tolerated. SEEK MEDICAL CARE IF:  You have unexplained abdominal pain.  You have abdominal pain associated with nausea or diarrhea.  You have pain when you urinate or have a bowel movement.  You experience abdominal pain that wakes you in the night.  You have abdominal pain that is worsened or improved by eating food.  You have abdominal pain that is worsened with eating fatty foods.  You have a fever. SEEK IMMEDIATE MEDICAL CARE IF:  Your pain does not go away within 2 hours.  You keep throwing up  (vomiting).  Your pain is felt only in portions of the abdomen, such as the right side or the left lower portion of the abdomen.  You pass bloody or black tarry stools. MAKE SURE YOU:  Understand these instructions.  Will watch your condition.  Will get help right away if you are not doing well or get worse.   This information is not intended to replace advice given to you by your health care provider. Make sure you discuss any questions you have with your health care provider.   Document Released: 03/02/2005 Document Revised: 02/11/2015 Document Reviewed: 01/30/2013 Elsevier Interactive Patient Education [employer].

## 2015-05-12 NOTE — ED Notes (Signed)
Blood bank called stating that pt is positive for non-specific blood antibodies. States it will take while to get type and screen. Dr. Thomasene Lot notified.

## 2015-05-12 NOTE — Assessment & Plan Note (Signed)
Abdominal pain chronic, increased over past 2 weeks with nausea and diarrhea. Reviewed recent labs, [name], and CT scan from January. CBC on 05/11/15 is without s/s of infection. CMP and lipase unremarkable. Based on examination, history, and recent results, do not believe she needs additional imaging.  Treat with Imodium and Zofran PRN. Discussed importance of hydration. Return precautions provided. Case discussed with PCP who agrees no additional imaging or treatment required at this point.

## 2015-05-13 ENCOUNTER — Telehealth: Payer: Self-pay | Admitting: Family Medicine

## 2015-05-13 NOTE — Telephone Encounter (Signed)
Called and notified patient of Kate's comments. Patient verbalized understanding.

## 2015-05-13 NOTE — Telephone Encounter (Signed)
Patient has appointment with Dr Deborra Medina on 05/14/2015.

## 2015-05-13 NOTE — Telephone Encounter (Signed)
Pt returned Chan's call, Best number [phone] / [phone] cell

## 2015-05-13 NOTE — Telephone Encounter (Signed)
Patient returned call about her lab work.

## 2015-05-13 NOTE — Telephone Encounter (Signed)
See results note. 

## 2015-05-13 NOTE — Telephone Encounter (Signed)
Patient states she went to ED in [place] yesterday for pain.                       Patient went to [facility] ED yesterday for her pain. She was told to call her PCP for follow up. She will call back if she decides to schedule OV here.

## 2015-05-14 ENCOUNTER — Ambulatory Visit: Payer: Commercial Managed Care - HMO | Admitting: Podiatry

## 2015-05-14 ENCOUNTER — Ambulatory Visit (INDEPENDENT_AMBULATORY_CARE_PROVIDER_SITE_OTHER): Payer: Commercial Managed Care - HMO | Admitting: Physician Assistant

## 2015-05-14 ENCOUNTER — Other Ambulatory Visit: Payer: Commercial Managed Care - HMO

## 2015-05-14 ENCOUNTER — Encounter: Payer: Self-pay | Admitting: Physician Assistant

## 2015-05-14 ENCOUNTER — Encounter: Payer: Self-pay | Admitting: Family Medicine

## 2015-05-14 ENCOUNTER — Ambulatory Visit (INDEPENDENT_AMBULATORY_CARE_PROVIDER_SITE_OTHER): Payer: Commercial Managed Care - HMO | Admitting: Family Medicine

## 2015-05-14 VITALS — BP 130/80 | HR 84 | Ht 63.0 in | Wt 220.6 lb

## 2015-05-14 VITALS — BP 130/74 | HR 99 | Temp 97.7°F | Wt 221.5 lb

## 2015-05-14 DIAGNOSIS — D696 Thrombocytopenia, unspecified: Secondary | ICD-10-CM

## 2015-05-14 DIAGNOSIS — K921 Melena: Secondary | ICD-10-CM | POA: Diagnosis not present

## 2015-05-14 DIAGNOSIS — R109 Unspecified abdominal pain: Secondary | ICD-10-CM

## 2015-05-14 DIAGNOSIS — R197 Diarrhea, unspecified: Secondary | ICD-10-CM | POA: Diagnosis not present

## 2015-05-14 DIAGNOSIS — K7460 Unspecified cirrhosis of liver: Secondary | ICD-10-CM | POA: Diagnosis not present

## 2015-05-14 DIAGNOSIS — R1084 Generalized abdominal pain: Secondary | ICD-10-CM

## 2015-05-14 DIAGNOSIS — R110 Nausea: Secondary | ICD-10-CM

## 2015-05-14 MED ORDER — DICYCLOMINE HCL 10 MG PO CAPS: 10.0000 mg | ORAL_CAPSULE | Freq: Three times a day (TID) | ORAL | Status: DC

## 2015-05-14 NOTE — Assessment & Plan Note (Addendum)
Persistent- she is followed by GI. Reglan seems to be helping but I am concerned about possibility of tardive dyskinesia which I discussed with pt today.   Last saw Dr. Havery Moros less than a month ago- note again reviewed today. Her condition warrants immediate follow up with GI- will ask our office to help her make an appointment.

## 2015-05-14 NOTE — Patient Instructions (Addendum)
Your physician has requested that you go to the basement for the following lab work before leaving today:Fecal lactoferrin, C. Diff and Stool culture.  We have sent the following medications to your pharmacy for you to pick up at your convenience:Bentyl.  Continue Zofran three times a day  and Prilosec daily.  Please follow up with Nicoletta Ba, PA on 05/25/15 at 1:30pm.

## 2015-05-14 NOTE — Progress Notes (Signed)
Subjective:   Patient ID: Stephanie [name], female    DOB: [birth year], 64 y.o.   MRN: [id number]  Stephanie [name] is a pleasant 64 y.o. year old female with complicated medical history including NASH cirrhosis and thrombocytopenia who presents to clinic today with Hospitalization Follow-up; Diarrhea; and Melena  on 05/14/2015  HPI:  Was seen in ER on 12/6- note reviewed.  Presented for upper abdominal pain, nausea, vomiting and melena.  Abdominal CT neg- did have some stool in her colon although she reported diarrhea.  Hemoccult neg in ED.  Reglan was given which seemed to help with nausea.  Given Reglan and advised to follow up with me today.  Of note, saw my partner, Owens Shark, NP, on 12/5 for abdominal pain.  Lipase neg, labs stable.  Today still nauseated and having abdominal pain.  US Abdomen Complete  04/28/2015  CLINICAL DATA:  Abdominal pain. EXAM: ULTRASOUND ABDOMEN COMPLETE COMPARISON:  Ultrasound 09/15/2014.  CT 06/13/2014. FINDINGS: Gallbladder: Cholecystectomy. Common bile duct: Diameter: 5.4 mm Liver: Liver is dense and nodular consistent with patient's known cirrhosis. No focal hepatic abnormality identified. IVC: No abnormality visualized. Pancreas: Visualized portion unremarkable. Spleen: Spleen measures 14.2 cm with a volume of 556.1 cubic cm. Findings consist with splenomegaly. Right Kidney: Length: 11.8 cm. Echogenicity within normal limits. No mass or hydronephrosis visualized. Left Kidney: Length: 12.2 cm. Echogenicity within normal limits. No mass or hydronephrosis visualized. Abdominal aorta: No aneurysm visualized. Other findings: None. IMPRESSION: 1. Findings consistent with cirrhosis.  Associated splenomegaly. 2. Cholecystectomy. No biliary distention. Similar findings noted on prior exams. Electronically Signed   By: Marcello Moores  Register   On: 04/28/2015 09:36   Ct Abdomen Pelvis W Contrast  05/12/2015  CLINICAL DATA:  Pain across the abdomen. Melena. History of  bleeding ulcer. EXAM: CT ABDOMEN AND PELVIS WITH CONTRAST TECHNIQUE: Multidetector CT imaging of the abdomen and pelvis was performed using the standard protocol following bolus administration of intravenous contrast. CONTRAST:  158m OMNIPAQUE IOHEXOL 300 MG/ML  SOLN COMPARISON:  07/01/2012 FINDINGS: Lower chest and abdominal wall:  No contributory findings. Hepatobiliary: Cirrhotic liver morphology with macroscopic fibrosis. 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 An exophytic nodule from the left lobe liver is stable from 2014.Cholecystectomy. Normal common bile duct diameter. Pancreas: Fatty atrophy without acute finding. Spleen: Mildly enlarged in the setting of cirrhosis. Splenomesenteric shunt noted, accounting for proliferation of omental vessels with linear reticulated appearance. Adrenals/Urinary Tract: Negative adrenals. No hydronephrosis or stone. Unremarkable bladder. Reproductive:Hysterectomy with negative adnexa. Pelvic floor laxity. Stomach/Bowel: No visible ulcer, gastritis, or perforation. Submucosal lipomatous change to the proximal colon and stomach. No active inflammation or bowel obstruction. Appendix not identified. No pericecal inflammatory change. Vascular/Lymphatic: No acute vascular abnormality. No mass or adenopathy. Peritoneal: No ascites or pneumoperitoneum. Musculoskeletal: L3-4, L4-5, and L5-S1 discectomy. There is doubtful bony fusion at L3-4 but no evident failure of the associated rod and pedicle screws. Dorsal column stimulator at the lower thoracic level. No acute osseous finding. IMPRESSION: 1. No acute finding. 2. Cirrhosis and portal hypertension. Electronically Signed   By: JMonte FantasiaM.D.   On: 05/12/2015 15:28   Dg Abd Acute W/chest  05/12/2015  CLINICAL DATA:  Two week history of diarrhea. Currently with melena. Nausea and dizziness. EXAM: DG ABDOMEN ACUTE W/ 1V CHEST COMPARISON:  Chest radiograph February 19, 2015; CT abdomen and  pelvis June 13, 2014 FINDINGS: PA chest: No edema or consolidation. Heart size and pulmonary vascularity are normal. No adenopathy. Thoracic  stimulator tips are in the lower thoracic region. There is postoperative change in the lower cervical spine region. Supine and upright abdomen: There is moderate stool in the colon. There is no bowel dilatation or air-fluid level suggesting obstruction. No free air. There is postoperative change in the lumbar spine. IMPRESSION: Moderate stool in colon. Bowel gas pattern unremarkable without obstruction or free air. No lung edema or consolidation. Electronically Signed   By: Lowella Grip III M.D.   On: 05/12/2015 13:26     Review of Systems  Constitutional: Negative.   Gastrointestinal: Positive for nausea, vomiting, abdominal pain and diarrhea.  Genitourinary: Negative.   Musculoskeletal: Negative.   Skin: Negative.   Allergic/Immunologic: Negative.   Neurological: Positive for light-headedness.  All other systems reviewed and are negative.      Objective:    BP 130/74 mmHg  Pulse 99  Temp(Src) 97.7 F (36.5 C) (Oral)  Wt 221 lb 8 oz (100.472 kg)  SpO2 93%   Physical Exam   Constitutional: She appears well-nourished. She does not have a sickly appearance.  Cardiovascular: Normal rate and regular rhythm.  Pulmonary/Chest: Effort normal and breath sounds normal.  Abdominal: Soft. Bowel sounds are normal. There is generalized tenderness. There is no rigidity, no rebound, no guarding and no CVA tenderness.  Skin: Skin is warm and dry.  Ext:  No edema Neuro:  CN II- XII intact     Assessment & Plan:   Generalized abdominal pain - Plan: Ambulatory referral to Gastroenterology  Melena No Follow-up on file.

## 2015-05-14 NOTE — Progress Notes (Signed)
Patient ID: Stephanie [name], female   DOB: 23-Jun-1950, 64 y.o.   MRN: [id number]   Subjective:    Patient ID: Stephanie [name], female    DOB: February 23, 1951, 64 y.o.   MRN: [id number]  HPI Stephanie [name]  Is a pleasant 64 year old white female recently established with Dr. Havery Moros with reported history of Stephanie [name] cirrhosis , chronic abdominal pain which is felt to be chronic abdominal wall pain. She is status post cholecystectomy, has chronic GERD, history of adenomatous colon polyps, bipolar disorder, sleep apnea and obesity. She was last seen in the office in mid November 2016.   she comes in today stating that she's been having upper abdominal discomfort nausea and diarrhea over the past 2-3 weeks. He had been to the emergency room on 12 6 and has already seen her PCP as well. Evaluation in the ER with stable labs and CT of the abdomen and pelvis showing a cirrhotic liver no ascites no acute findings. She's been taking Zofran for nausea Had been given a prescription for Reglan by the ER M.D. But was told by the pharmacist not to take this. She is also been on Prilosec twice daily over the past couple of weeks. She has not had any recent antibiotics. She has been able to eats but not eating much. She relates having 6 bowel movements so far today and says most nights she's been getting up a couple of times at night with diarrheal stools. She says his stools are loose to watery nonbloody she's also noted a lot of gas and belching. She is concerned because the symptoms have been persisting over the past couple of weeks.  She's not had any stool studies done.  Last colonoscopy March 2016 3 polyps removed which were tubular adenomas.  She was documented heme negative in the ER though concerned that day with black stool.  Review of Systems Pertinent positive and negative review of systems were noted in the above HPI section.  All other review of systems was otherwise negative.  Outpatient Encounter Prescriptions as of  05/14/2015  Medication Sig  . ABILIFY 30 MG tablet TAKE 1 TABLET BY MOUTH EVERY MORNING  . clonazePAM (KLONOPIN) 0.5 MG tablet TAKE 1 TABLET BY MOUTH TWICE DAILY  . diazepam (VALIUM) 5 MG tablet TAKE 1 TABLET BY MOUTH TWICE A DAY  . docusate sodium (COLACE) 100 MG capsule TAKE 1 CAPSULE BY MOUTH 2 TIMES DAILY AS NEEDED FOR CONSTIPATION  . gabapentin (NEURONTIN) 100 MG capsule Take 100 mg by mouth at bedtime.   . lamoTRIgine (LAMICTAL) 100 MG tablet Take 3 tablets by mouth once daily  . metFORMIN (GLUCOPHAGE) 1000 MG tablet TAKE 1 TABLET (1,000 MG TOTAL) BY MOUTH 2 (TWO) TIMES DAILY WITH A MEAL.  Marland Kitchen metoCLOPramide (REGLAN) 5 MG tablet Take 1 tablet (5 mg total) by mouth 3 (three) times daily.  . mirtazapine (REMERON) 15 MG tablet TAKE 1 TABLET BY MOUTH AT BEDTIME  . nystatin (MYCOSTATIN) powder Apply topically 2 (two) times daily.  Marland Kitchen omeprazole (PRILOSEC) 40 MG capsule TAKE ONE CAPSULE BY MOUTH EVERY DAY (Patient taking differently: TAKE two CAPSULE BY MOUTH EVERY DAY)  . ondansetron (ZOFRAN ODT) 4 MG disintegrating tablet Take 1 tablet (4 mg total) by mouth every 8 (eight) hours as needed for nausea or vomiting.  Marland Kitchen rOPINIRole (REQUIP) 3 MG tablet TAKE 1 TABLET BY MOUTH AT BEDTIME  . simvastatin (ZOCOR) 10 MG tablet TAKE 1 TABLET BY MOUTH AT BEDTIME  . SYMBICORT 160-4.5 MCG/ACT inhaler INHALE  2 PUFFS INTO THE LUNGS 2 (TWO) TIMES DAILY.  Marland Kitchen tolterodine (DETROL LA) 2 MG 24 hr capsule TAKE ONE CAPSULE BY MOUTH DAILY  . traMADol (ULTRAM) 50 MG tablet TAKE 2 TABLETS BY MOUTH EVERY MORNING AND 2 TABLETS BY MOUTH EVERY EVENING  . zolpidem (AMBIEN) 5 MG tablet TAKE 1 TABLET BY MOUTH AT BEDTIME  . dicyclomine (BENTYL) 10 MG capsule Take 1 capsule (10 mg total) by mouth 3 (three) times daily before meals.   No facility-administered encounter medications on file as of 05/14/2015.   Allergies  Allergen Reactions  . Doxycycline Other (See Comments)    Face red and swollen; no difficulty breathing    Patient Active Problem List   Diagnosis Date Noted  . Abdominal pain 05/14/2015  . Melena 05/14/2015  . Accidental fall from chair 03/30/2015  . Chest pain 02/25/2015  . Myalgia and myositis 02/11/2015  . Yeast infection 12/15/2014  . Intertrigo 12/15/2014  . Thrombocytopenia ([place]) 08/19/2014  . IGTN (ingrowing toe nail) 08/19/2014  . Abdominal pain, chronic, epigastric 06/12/2014  . Elevated blood pressure 03/27/2014  . Liver cirrhosis secondary to NASH 02/27/2014  . Abnormal chest CT 02/21/2014  . Chest wall pain 12/29/2013  . Dizziness and giddiness 10/30/2013  . DOE (dyspnea on exertion) 10/30/2013  . Nausea alone 10/30/2013  . Diabetes mellitus type 2, controlled, with complications ([place]) 72/02/4708  . DNR (do not resuscitate) 07/29/2013  . Encounter for routine gynecological examination 07/29/2013  . Dyspnea 07/10/2013  . Lung nodule 07/10/2013  . Right foot pain 05/20/2013  . Varicose veins of lower extremities with other complications 62/83/6629  . GERD (gastroesophageal reflux disease) 10/09/2012  . Cirrhosis of liver ([place]) 02/02/2012  . Anxiety 07/11/2011  . Aortic dissection ([place])   . BACK PAIN, LUMBAR, CHRONIC 11/19/2009  . UNS ADVRS EFF OTH RX MEDICINAL&BIOLOGICAL SBSTNC 12/16/2008  . UNSPECIFIED VITAMIN D DEFICIENCY 09/26/2008  . TOBACCO ABUSE 06/19/2008  . RESTLESS LEGS SYNDROME 04/27/2007  . INSOMNIA 04/27/2007  . Benign neoplasm of colon 04/04/2007  . HLD (hyperlipidemia) 03/08/2007  . OSA (obstructive sleep apnea) 03/08/2007  . Essential hypertension 03/08/2007  . Asthma 03/08/2007  . BIPOLAR AFFECTIVE DISORDER, HX OF 03/08/2007   Social History   Social History  . Marital Status: Single    Spouse Name: N/A  . Number of Children: 2  . Years of Education: N/A   Occupational History  . Disabled    Social History Main Topics  . Smoking status: Former Smoker -- 0.50 packs/day for 50 years    Types: Cigarettes    Quit date: 04/06/2013  .  Smokeless tobacco: Never Used  . Alcohol Use: No  . Drug Use: No  . Sexual Activity: Not on file   Other Topics Concern  . Not on file   Social History Narrative   1 son, 74+ y/o   Daily caffeine use: 2 cups daily   Does not get regular exercise   Disability due to bipolar      Has a living will- she is a DNR (she has form at home per pt).    Ms. Fraga family history includes Breast cancer in her mother; Diabetes in an other family member; Lung cancer in her mother; Stomach cancer in her father. There is no history of Colon cancer, Esophageal cancer, or Rectal cancer.      Objective:    Filed Vitals:   05/14/15 1354  BP: 130/80  Pulse: 84    Physical Exam   Well-developed  older white female in no acute distress, pleasant somewhat tearful. Height 5 foot 3, weight 220 , blood pressure 130/80, pulse 84. HEENT; nontraumatic, cephalic EOMI PERRLA sclera anicteric, Cardiovascular; regular rate and rhythm with S1-S2 no murmur rub or gallop, Pulmonary ;clear bilaterally, Abdomen; large soft , nondistended no appreciable fluid wave, bowel sounds are present she is rather diffusely tender no guarding or rebound no palpable mass or hepatosplenomegaly on Rectal; exam not done, Ext; no clubbing cyanosis or edema skin warm dry, Neuropsych; mood and affect appropriate no tearful       Assessment & Plan:   #1 64 yo female with NASH cirrhosis  #2  2-3 week history of nausea and upper abdominal discomfort bloating gas and diarrhea. Negative CT scan on 05/12/2015. Suspect this is of infectious etiology , prolonged for viral though possible, will rule out C. Difficile.  #3 GERD  #4 status post cholecystectomy  #5 history of adenomatous colon polyps  #6 obesity  #7 bipolar disorder  #8  Polypharmacy   Plan; patient will continue twice a day Prilosec for 2 weeks then decrease to daily   start Zofran 4 mg every morning then every 6 hours when necessary thereafter   add Bentyl 10 mg half  hour before meals short-term   patient states she has not taken the metoclopramide but is advised strongly again not to use metoclopramide  Brat diet until symptoms improved  Push hydration  We'll check stools for C. Difficile, lactoferrin, and culture  Reassurance   Return office visit with Amy in 10 days.    Amy Genia Harold PA-C 05/14/2015   Cc: Lucille Passy, MD

## 2015-05-14 NOTE — Patient Instructions (Addendum)
Please stop by to see Stephanie [name] on your way out- please let her know that you see Dr. Havery Moros.  Happy Holidays.

## 2015-05-14 NOTE — Progress Notes (Signed)
I agree with this assessment and plan. I had established care with her last month and she did not have any changes in stools or diarrhea at that time. She has had some chronic abdominal pain which I had attributed to abdominal wall pain at my last clinic visit, unclear if her symptoms are similar today. The recent normal CT is reassuring. Agree with measures as outlined, will await stool studies, and follow up if symptoms persist. Thanks for seeing her today.

## 2015-05-14 NOTE — Progress Notes (Signed)
Pre visit review using our clinic review tool, if applicable. No additional management support is needed unless otherwise documented below in the visit note. 

## 2015-05-15 ENCOUNTER — Telehealth: Payer: Self-pay

## 2015-05-15 NOTE — Telephone Encounter (Signed)
Called patient to let her know what the status is of her medicine.  She said she still feels bad, I informed her about the Doctor on call and how to reach them and also that the ED is always an option.  She has an appointment with Nicoletta Ba PA-C on Monday.

## 2015-05-15 NOTE — Telephone Encounter (Signed)
Faxed form back to [facility] at [phone] for dicyclomine 48m capsule, one capsule by mouth 3 times a day before meals.Dx:  Abdominal discomfort R10.9, Nausea R11.0, Hepatic Cirrhosis K74.60.  She will be stopping the reglan, will not being taking dicyclomine and generic reglan together.  Will await outcome.

## 2015-05-16 LAB — FECAL LACTOFERRIN, QUANT: LACTOFERRIN: POSITIVE

## 2015-05-16 LAB — CLOSTRIDIUM DIFFICILE BY PCR: Toxigenic C. Difficile by PCR: NOT DETECTED

## 2015-05-17 ENCOUNTER — Other Ambulatory Visit: Payer: Self-pay | Admitting: Family Medicine

## 2015-05-18 ENCOUNTER — Telehealth: Payer: Self-pay | Admitting: Physician Assistant

## 2015-05-18 NOTE — Telephone Encounter (Signed)
Patient continues with diarrhea. See the stool studies. No new symptoms. Denies bloody stools or fever. She has had 6 stools today that she describes as watery. Per Amy, she will use the Bentyl QID. Take Imodium 2 every morning and repeat if needed during the day. Appointment scheduled for follow up 05/24/15 at 2:00 pm. Appointment card given to the patient.

## 2015-05-18 NOTE — Telephone Encounter (Signed)
Last f/u 08/2014-CPE

## 2015-05-18 NOTE — Telephone Encounter (Signed)
Dicyclomine has been approved thru 08/16/2015, pharmacy and patient informed.

## 2015-05-18 NOTE — Telephone Encounter (Signed)
Rx faxed to pharmacy  

## 2015-05-19 LAB — STOOL CULTURE

## 2015-05-20 ENCOUNTER — Other Ambulatory Visit: Payer: Self-pay | Admitting: *Deleted

## 2015-05-20 ENCOUNTER — Telehealth: Payer: Self-pay | Admitting: Physician Assistant

## 2015-05-20 MED ORDER — BENTYL 10 MG PO CAPS: ORAL_CAPSULE | ORAL | Status: DC

## 2015-05-20 NOTE — Telephone Encounter (Signed)
Sent Bentyl brand name to [facility], [phone] with 3 refills. She is to take this 3 tablets daily before meals.

## 2015-05-25 ENCOUNTER — Ambulatory Visit (INDEPENDENT_AMBULATORY_CARE_PROVIDER_SITE_OTHER): Payer: Commercial Managed Care - HMO | Admitting: Physician Assistant

## 2015-05-25 ENCOUNTER — Encounter: Payer: Self-pay | Admitting: Physician Assistant

## 2015-05-25 VITALS — BP 130/82 | HR 101 | Ht 63.0 in | Wt 223.0 lb

## 2015-05-25 DIAGNOSIS — R109 Unspecified abdominal pain: Secondary | ICD-10-CM | POA: Diagnosis not present

## 2015-05-25 DIAGNOSIS — R110 Nausea: Secondary | ICD-10-CM | POA: Diagnosis not present

## 2015-05-25 DIAGNOSIS — K7460 Unspecified cirrhosis of liver: Secondary | ICD-10-CM

## 2015-05-25 DIAGNOSIS — R197 Diarrhea, unspecified: Secondary | ICD-10-CM

## 2015-05-25 MED ORDER — METRONIDAZOLE 250 MG PO TABS: 250.0000 mg | ORAL_TABLET | Freq: Four times a day (QID) | ORAL | Status: DC

## 2015-05-25 NOTE — Patient Instructions (Signed)
We sent a prescription to [facility] for Flagyl ( Metronidezole).250 mg. . Take 1 tab 4 times daily for 10 days. Take Align, 1 capsule daily for 1 month.  Take Imodium 1 tab by mouth every morning.  Continue Bentyl 10 mg 3 times daily as needed for cramping.   We made you a follow up appointment with Nicoletta Ba PA  For 06-15-2015 at 1:30 PM.  If you are feeling better you can call and cancel the appointment.

## 2015-05-25 NOTE — Progress Notes (Signed)
Patient ID: Stephanie [name], female   DOB: [birth year], 64 y.o.   MRN: [id number]   Subjective:    Patient ID: Stephanie [name], female    DOB: [birth year], 64 y.o.   MRN: [id number]  HPI  Stephanie [name]  Is a 3 year old white female [place] established with Dr. Havery [name] prior patient of Dr. Kelby [name] with history of Stephanie [name] cirrhosis and chronic abdominal pain felt to be chronic abdominal wall pain. She is status post cholecystectomy, has chronic GERD, history of adenomatous colon polyps, bipolar disorder and obesity. She was seen in the office on 12 8 with complaints of 2-3 weeks of nausea or abdominal discomfort bloating gas and diarrhea. She had had a CT scan on 05/12/2015 which was unremarkable with the exception of the cirrhosis it was felt that her GI symptoms were likely infectious possibly viral. We did do stool studies including stool for C. Difficile which was negative though fecal lactoferrin was positive. He was treated symptomatically with Zofran and Bentyl and bland diet.  She comes back in today for follow-up. She is immediately tearful when she talks about her symptoms and says she is afraid she has cancer.  She admits that the nausea is better she has been eating her weight is stable but she continues to complain of some bloating cramping and which she describes as soft mushy stools at least 7-8 times per day. He saw a little bit of blood today with a bowel movement and this was the first episode. Patient did have a recent colonoscopy done in March 2016 per Dr. Deatra [name] and had 3 polyps removed which were adenomatous and was recommended for 3 year follow-up. Were no internal hemorrhoids noted.  Review of Systems Pertinent positive and negative review of systems were noted in the above HPI section.  All other review of systems was otherwise negative.  Outpatient Encounter Prescriptions as of 05/25/2015  Medication Sig  . ABILIFY 30 MG tablet TAKE 1 TABLET BY MOUTH EVERY MORNING  . clonazePAM  (KLONOPIN) 0.5 MG tablet TAKE 1 TABLET BY MOUTH TWICE DAILY  . diazepam (VALIUM) 5 MG tablet TAKE 1 TABLET BY MOUTH TWICE DAILY  . dicyclomine (BENTYL) 10 MG capsule Take 1 capsule (10 mg total) by mouth 3 (three) times daily before meals.  . docusate sodium (COLACE) 100 MG capsule TAKE 1 CAPSULE BY MOUTH 2 TIMES DAILY AS NEEDED FOR CONSTIPATION  . gabapentin (NEURONTIN) 100 MG capsule Take 100 mg by mouth at bedtime.   . lamoTRIgine (LAMICTAL) 100 MG tablet Take 3 tablets by mouth once daily  . metFORMIN (GLUCOPHAGE) 1000 MG tablet TAKE 1 TABLET (1,000 MG TOTAL) BY MOUTH 2 (TWO) TIMES DAILY WITH A MEAL.  Marland Kitchen metoCLOPramide (REGLAN) 5 MG tablet Take 1 tablet (5 mg total) by mouth 3 (three) times daily.  . mirtazapine (REMERON) 15 MG tablet TAKE 1 TABLET BY MOUTH AT BEDTIME  . nystatin (MYCOSTATIN) powder Apply topically 2 (two) times daily.  Marland Kitchen omeprazole (PRILOSEC) 40 MG capsule TAKE ONE CAPSULE BY MOUTH EVERY DAY (Patient taking differently: TAKE two CAPSULE BY MOUTH EVERY DAY)  . ondansetron (ZOFRAN ODT) 4 MG disintegrating tablet Take 1 tablet (4 mg total) by mouth every 8 (eight) hours as needed for nausea or vomiting.  Marland Kitchen rOPINIRole (REQUIP) 3 MG tablet TAKE 1 TABLET BY MOUTH AT BEDTIME  . simvastatin (ZOCOR) 10 MG tablet TAKE 1 TABLET BY MOUTH AT BEDTIME  . SYMBICORT 160-4.5 MCG/ACT inhaler INHALE 2 PUFFS INTO THE LUNGS 2 (TWO) TIMES DAILY.  Marland Kitchen  tolterodine (DETROL LA) 2 MG 24 hr capsule TAKE ONE CAPSULE BY MOUTH DAILY  . traMADol (ULTRAM) 50 MG tablet TAKE 2 TABLETS BY MOUTH EVERY MORNING AND 2 TABLETS BY MOUTH EVERY EVENING  . zolpidem (AMBIEN) 5 MG tablet TAKE 1 TABLET BY MOUTH AT BEDTIME  . metroNIDAZOLE (FLAGYL) 250 MG tablet Take 1 tablet (250 mg total) by mouth 4 (four) times daily.  . [DISCONTINUED] BENTYL 10 MG capsule Take 1 tablet 3 times daily before meals. (Patient not taking: Reported on 05/25/2015)   No facility-administered encounter medications on file as of 05/25/2015.    Allergies  Allergen Reactions  . Doxycycline Other (See Comments)    Face red and swollen; no difficulty breathing   Patient Active Problem List   Diagnosis Date Noted  . Abdominal pain 05/14/2015  . Melena 05/14/2015  . Accidental fall from chair 03/30/2015  . Chest pain 02/25/2015  . Myalgia and myositis 02/11/2015  . Yeast infection 12/15/2014  . Intertrigo 12/15/2014  . Thrombocytopenia ([place]) 08/19/2014  . IGTN (ingrowing toe nail) 08/19/2014  . Abdominal pain, chronic, epigastric 06/12/2014  . Elevated blood pressure 03/27/2014  . Liver cirrhosis secondary to NASH 02/27/2014  . Abnormal chest CT 02/21/2014  . Chest wall pain 12/29/2013  . Dizziness and giddiness 10/30/2013  . DOE (dyspnea on exertion) 10/30/2013  . Nausea alone 10/30/2013  . Diabetes mellitus type 2, controlled, with complications ([place]) 90/24/0973  . DNR (do not resuscitate) 07/29/2013  . Encounter for routine gynecological examination 07/29/2013  . Dyspnea 07/10/2013  . Lung nodule 07/10/2013  . Right foot pain 05/20/2013  . Varicose veins of lower extremities with other complications 53/29/9242  . GERD (gastroesophageal reflux disease) 10/09/2012  . Cirrhosis of liver ([place]) 02/02/2012  . Anxiety 07/11/2011  . Aortic dissection ([place])   . BACK PAIN, LUMBAR, CHRONIC 11/19/2009  . UNS ADVRS EFF OTH RX MEDICINAL&BIOLOGICAL SBSTNC 12/16/2008  . UNSPECIFIED VITAMIN D DEFICIENCY 09/26/2008  . TOBACCO ABUSE 06/19/2008  . RESTLESS LEGS SYNDROME 04/27/2007  . INSOMNIA 04/27/2007  . Benign neoplasm of colon 04/04/2007  . HLD (hyperlipidemia) 03/08/2007  . OSA (obstructive sleep apnea) 03/08/2007  . Essential hypertension 03/08/2007  . Asthma 03/08/2007  . BIPOLAR AFFECTIVE DISORDER, HX OF 03/08/2007   Social History   Social History  . Marital Status: Single    Spouse Name: N/A  . Number of Children: 2  . Years of Education: N/A   Occupational History  . Disabled    Social History Main Topics   . Smoking status: Former Smoker -- 0.50 packs/day for 50 years    Types: Cigarettes    Quit date: 04/06/2013  . Smokeless tobacco: Never Used  . Alcohol Use: No  . Drug Use: No  . Sexual Activity: Not on file   Other Topics Concern  . Not on file   Social History Narrative   1 son, 92+ y/o   Daily caffeine use: 2 cups daily   Does not get regular exercise   Disability due to bipolar      Has a living will- she is a DNR (she has form at home per pt).    Ms. Bessler family history includes Breast cancer in her mother; Lung cancer in her mother; Stomach cancer in her father. There is no history of Colon cancer, Esophageal cancer, or Rectal cancer.      Objective:    Filed Vitals:   05/25/15 1400  BP: 130/82  Pulse: 101    Physical Exam  Well-developed white female in no acute distress, she is tearful during discussion and anxious blood pressure 130/82 pulse 100 height 5 foot 3 weight 223. HEENT ;nontraumatic normocephalic EOMI PERRLA sclera anicteric, Cardiovascular; regular rate and rhythm with S1-S2 no murmur or gallop, Pulmonary ;clear bilaterally, Abdome;n obese soft some mild bilateral lower quadrant tenderness no guarding or rebound no palpable mass or hepatosplenomegaly bowel sounds present, Rectal; exam not done, Extremities ;no clubbing cyanosis or edema skin warm and dry, Neuropsych ;mood and affect appropriate though  tearful       Assessment & Plan:   #1 64 yo female with NASH cirrhosis #2 3-4 week hx of abdominal cramping, frequent loose stools , bloating- workup negative except fecal lactoferrin + #3 OSA #4 Bipolar disorder #5GERD #6 HTN  #7 AODM #8 obesity  Plan; will give an empiric course of flagyl 250 mg po QID x 10 days, take with food Add imodium one po qam Add Align one daily x 2 months Will continue Bentyl 10 mg tid  as needed  She will call for follow up if no better in next couple weeks     Alfredia Ferguson PA-C 05/25/2015   Cc:  Lucille Passy, MD

## 2015-05-26 ENCOUNTER — Encounter: Payer: Self-pay | Admitting: Podiatry

## 2015-05-26 ENCOUNTER — Ambulatory Visit (INDEPENDENT_AMBULATORY_CARE_PROVIDER_SITE_OTHER): Payer: Commercial Managed Care - HMO | Admitting: Podiatry

## 2015-05-26 DIAGNOSIS — B351 Tinea unguium: Secondary | ICD-10-CM | POA: Diagnosis not present

## 2015-05-26 DIAGNOSIS — G629 Polyneuropathy, unspecified: Secondary | ICD-10-CM

## 2015-05-26 DIAGNOSIS — M79676 Pain in unspecified toe(s): Secondary | ICD-10-CM | POA: Diagnosis not present

## 2015-05-26 DIAGNOSIS — R52 Pain, unspecified: Secondary | ICD-10-CM | POA: Diagnosis not present

## 2015-05-26 NOTE — Progress Notes (Signed)
Agree with assessment and plan. Her abdominal pain is chronic and very likely abdominal wall pain based on my last evaluation of her. No concerning findings on CT scan in this light.

## 2015-05-26 NOTE — Patient Instructions (Signed)

## 2015-05-26 NOTE — Progress Notes (Deleted)
   Subjective:    Patient ID: Stephanie [name], female    DOB: 23-Mar-1951, 64 y.o.   MRN: [id number]  HPI I AM HERE TO GET SOME INSERTS    Review of Systems  All other systems reviewed and are negative.      Objective:   Physical Exam        Assessment & Plan:

## 2015-05-28 NOTE — Progress Notes (Signed)
Patient ID: Stephanie [name], female   DOB: [birth year], 64 y.o.   MRN: [id number]  Subjective: 64 y.o. returns the office today for painful, elongated, thickened toenails which she is unable to trim herself. Denies any redness or drainage around the nails. She states that she also continues to have burning, pain to the bottoms of her feet.  She does not want a steroid gabapentin or Lyrica due to side effects. She is also asking for new diabetic shoes or inserts as she believes that the was that she has isthmic her feet hurt more as they are worn out. They were helping at first. Denies any acute changes since last appointment and no new complaints today. Denies any systemic complaints such as fevers, chills, nausea, vomiting.   Objective: AAO 3, NAD DP/PT pulses palpable, CRT less than 3 seconds Protective sensation decreased with Simms Weinstein monofilament Nails hypertrophic, dystrophic, elongated, brittle, discolored 10. There is tenderness overlying the nails 1-5 bilaterally. There is no surrounding erythema or drainage along the nail sites. No open lesions or pre-ulcerative lesions are identified. No other areas of tenderness bilateral lower extremities. No overlying edema, erythema, increased warmth. No pain with calf compression, swelling, warmth, erythema.  Assessment: Patient presents with symptomatic onychomycosis,  Neuropathy  Plan: -Treatment options including alternatives, risks, complications were discussed -Nails sharply debrided 10 without complication/bleeding. -She would like to try topical treatment for neuropathy. Ordered compound cream. -Completed paperwork for  precertification diabetic shoes. -Discussed daily foot inspection. If there are any changes, to call the office immediately.  -Follow-up in 3 months or sooner if any problems are to arise. In the meantime, encouraged to call the office with any questions, concerns, changes symptoms.  Celesta Gentile, DPM

## 2015-06-03 ENCOUNTER — Telehealth: Payer: Self-pay | Admitting: *Deleted

## 2015-06-03 ENCOUNTER — Other Ambulatory Visit: Payer: Self-pay | Admitting: *Deleted

## 2015-06-03 DIAGNOSIS — R911 Solitary pulmonary nodule: Secondary | ICD-10-CM

## 2015-06-03 NOTE — Telephone Encounter (Addendum)
Pt asked the status of the neuropathy cream from Dr. Jacqualyn Posey.  06/04/2015 - Pt asked if Dr.Wagoner was going to call in a prescription for her feet.  06/08/2105 - Pt states her next appt is 06/25/2015, and is making progress with the boot, would like to know if she needed to remain in the boot until her 06/25/2015 appt.  Left message informing pt to stay in the boot until her 06/25/2015 visit.

## 2015-06-03 NOTE — Telephone Encounter (Signed)
I will check with lisa. I ordered a compound cream.

## 2015-06-04 ENCOUNTER — Encounter: Payer: Self-pay | Admitting: *Deleted

## 2015-06-04 ENCOUNTER — Ambulatory Visit (INDEPENDENT_AMBULATORY_CARE_PROVIDER_SITE_OTHER): Payer: Commercial Managed Care - HMO

## 2015-06-04 ENCOUNTER — Encounter: Payer: Commercial Managed Care - HMO | Attending: Family Medicine | Admitting: Dietician

## 2015-06-04 ENCOUNTER — Encounter: Payer: Self-pay | Admitting: Dietician

## 2015-06-04 ENCOUNTER — Ambulatory Visit (INDEPENDENT_AMBULATORY_CARE_PROVIDER_SITE_OTHER): Payer: Commercial Managed Care - HMO | Admitting: Podiatry

## 2015-06-04 ENCOUNTER — Encounter: Payer: Self-pay | Admitting: Podiatry

## 2015-06-04 ENCOUNTER — Telehealth: Payer: Self-pay | Admitting: Family Medicine

## 2015-06-04 VITALS — Wt 220.8 lb

## 2015-06-04 VITALS — BP 118/68 | HR 88 | Resp 18

## 2015-06-04 DIAGNOSIS — M8430XA Stress fracture, unspecified site, initial encounter for fracture: Secondary | ICD-10-CM | POA: Diagnosis not present

## 2015-06-04 DIAGNOSIS — G629 Polyneuropathy, unspecified: Secondary | ICD-10-CM | POA: Diagnosis not present

## 2015-06-04 DIAGNOSIS — E119 Type 2 diabetes mellitus without complications: Secondary | ICD-10-CM | POA: Diagnosis not present

## 2015-06-04 DIAGNOSIS — R52 Pain, unspecified: Secondary | ICD-10-CM | POA: Diagnosis not present

## 2015-06-04 MED ORDER — PREGABALIN 50 MG PO CAPS
50.0000 mg | ORAL_CAPSULE | Freq: Three times a day (TID) | ORAL | Status: DC
Start: 2015-06-04 — End: 2015-08-06

## 2015-06-04 NOTE — Telephone Encounter (Signed)
IT HAS BEEN TAKEN CARE OF THE PATIENT CAME IN TODAY AND STATED THAT THE COMPANY HAD CALLED HER AND SHE MISSED THE CALL AND WILL CALL THEM. Stephanie [name]

## 2015-06-04 NOTE — Progress Notes (Signed)
Pt came to diabetes class 1 today but left the room after break due to pain in her feet. She was crying and reported "severe pain:. Pt reports that podiatrist took her off Neurontin and was ordering some foot cream. She stopped taking Neurontin 1 week ago. Pt reports pain started yesterday. Called MD office (Dr Celesta Gentile) and they have an appt available at 3 pm today. Pt will f/u with MD. She did not want me to call someone to take her home. Pt reported she lived only 5 minutes from office and could drive home.She was assisted to her car and declined use of wheelchair.

## 2015-06-04 NOTE — Telephone Encounter (Signed)
Pt called to schedule a follow up in 3 months for her diabetes, but did not know if she needed a lab only appointment or schedule with Dr. Deborra Medina as well?   CB# [phone]

## 2015-06-04 NOTE — Telephone Encounter (Signed)
Lab only is ok.

## 2015-06-04 NOTE — Progress Notes (Signed)
Appt. Start Time: 9:00am Appt. End Time: 12:00 pm  Class 1 Diabetes Overview - define DM; state own type of DM; identify functions of pancreas and insulin; define insulin deficiency vs insulin resistance  Psychosocial - identify DM as a source of stress; state the effects of stress on BG control; verbalize appropriate stress management techniques; identify personal stress issues   Nutritional Management - describe effects of food on blood glucose; identify sources of carbohydrate, protein and fat; verbalize the importance of balance meals in controlling blood glucose; identify meals as well balanced or not; estimate servings of carbohydrate from menus; use food labels to identify servings size, content of carbohydrate, fiber, protein, fat, saturated fat and sodium; recognize food sources of fat, saturated fat, trans fat, sodium and verbalize goals for intake; describe healthful appropriate food choices when dining out   Exercise - describe the effects of exercise on blood glucose and importance of regular exercise in controlling diabetes; state a plan for personal exercise; verbalize contraindications for exercise  Self-Monitoring - state importance of HBGM and demo procedure accurately; use HBGM results to effectively manage diabetes; identify importance of regular HbA1C testing and goals for results  Acute Complications/Sick Day Guidelines - recognize hyperglycemia and hypoglycemia with causes and effects; identify blood glucose results as high, low or in control; list steps in treating and preventing high and low blood glucose; state appropriate measure to manage blood glucose when ill (need for meds, HBGM plan, when to call physician, need for fluids)  Chronic Complications/Foot, Skin, Eye Dental Care - identify possible long-term complications of diabetes (retinopathy, neuropathy, nephropathy, cardiovascular disease, infections); explain steps in prevention and treatment of chronic complications;  state importance of daily self-foot exams; describe how to examine feet and what to look for; explain appropriate eye and dental care  Lifestyle Changes/Goals & Health/Community Resources - state benefits of making appropriate lifestyle changes; identify habits that need to change (meals, tobacco, alcohol); identify strategies to reduce risk factors for personal health; set goals for proper diabetes care; state need for and frequency of healthcare follow-up; describe appropriate community resources for good health (ADA, web sites, apps)   Pregnancy/Sexual Health - define gestational diabetes; state importance of good blood glucose control and birth control prior to pregnancy; state importance of good blood glucose control in preventing sexual problems (impotence, vaginal dryness, infections, loss of desire); state relationship of blood glucose control and pregnancy outcome; describe risk of maternal and fetal complications  Teaching Materials Used: Class 1 Slides/Notebook Diabetes Booklet ID Card  Medic Alert/Medic ID Forms Sleep Evaluation Exercise Handout Daily Food Record Planning a Balanced Meal Goals for Class 1

## 2015-06-04 NOTE — Addendum Note (Signed)
Addended by: Gloriajean Dell on: 06/04/2015 12:04 PM   Modules accepted: Medications

## 2015-06-05 ENCOUNTER — Telehealth: Payer: Self-pay | Admitting: *Deleted

## 2015-06-05 ENCOUNTER — Other Ambulatory Visit: Payer: Self-pay | Admitting: Family Medicine

## 2015-06-05 NOTE — Telephone Encounter (Signed)
Rx called in to requested pharmacy 

## 2015-06-05 NOTE — Telephone Encounter (Signed)
LM for pt to return phone call to office to schedule lab appt.

## 2015-06-05 NOTE — Telephone Encounter (Signed)
Last f/u 08/2014-CPE

## 2015-06-05 NOTE — Telephone Encounter (Signed)
Phone call to follow up with patient regarding MD appointment and foot/leg pain. Left message to return call.

## 2015-06-08 DIAGNOSIS — G629 Polyneuropathy, unspecified: Secondary | ICD-10-CM | POA: Insufficient documentation

## 2015-06-08 NOTE — Progress Notes (Signed)
Patient ID: Stephanie [name], female   DOB: 1950-09-27, 65 y.o.   MRN: [id number]  Subjective: 65 y.o. returns the office today for increasing pain to both of her feet with the right side worse than left. She states that she has pins and needle sensation from her knees to her feet and she's having burning pain to her feet. She describes a throbbing pain of the right foot mostly. No recent injury or trauma. Previously she did not want to consider treatment with oral medication for neuropathy but she is considering at this point. Denies any acute changes since last appointment and no new complaints today. Denies any systemic complaints such as fevers, chills, nausea, vomiting.   Objective: AAO 3, NAD DP/PT pulses palpable, CRT less than 3 seconds Protective sensation decreased with Simms Weinstein monofilament There does appear to be pinpoint tenderness on the right third and fourth metatarsals. There is no other area pinpoint bony tenderness or pain the vibratory sensation to bilateral lower extremities. No other areas of tenderness. There is no overlying edema, erythema, increase in warmth bilateral lower extremity.  No open lesions or pre-ulcerative lesions. No pain with calf compression, swelling, warmth, erythema.  Assessment: Patient presents with symptomatic onychomycosis,  Neuropathy; cannot rule out stress fracture right foot  Plan: -X-rays were obtained and reviewed with the patient.  -Treatment options including alternatives, risks, complications were discussed -She previously treated with gabapentin without much relief. Because of this we will try Lyrica. This was ordered today. Follow-up with PCP as well.  -Compound cream for neuropathy also ordered last appointment. She is awaiting this. -Surgical shoe to the right foot due to the pain that she's having. -Follow-up as scheduled or sooner if any problems arise. In the meantime, encouraged to call the office with any questions,  concerns, change in symptoms.   Celesta Gentile, DPM

## 2015-06-10 ENCOUNTER — Other Ambulatory Visit: Payer: Self-pay | Admitting: *Deleted

## 2015-06-10 MED ORDER — DOCUSATE SODIUM 100 MG PO CAPS
ORAL_CAPSULE | ORAL | Status: DC
Start: 2015-06-10 — End: 2015-08-18

## 2015-06-11 ENCOUNTER — Encounter: Payer: Self-pay | Admitting: *Deleted

## 2015-06-11 ENCOUNTER — Encounter: Payer: Commercial Managed Care - HMO | Attending: Family Medicine | Admitting: *Deleted

## 2015-06-11 VITALS — Wt 225.1 lb

## 2015-06-11 DIAGNOSIS — E119 Type 2 diabetes mellitus without complications: Secondary | ICD-10-CM | POA: Diagnosis present

## 2015-06-11 NOTE — Progress Notes (Signed)
Appt. Start Time: 0900 Appt. End Time: 1200  Blood sugar records faxed to MD office.   Class 2 Diabetes Overview - define DM; state own type of DM; identify functions of pancreas and insulin; define insulin deficiency vs insulin resistance  Psychosocial - identify DM as a source of stress; state the effects of stress on BG control; verbalize appropriate stress management techniques; identify personal stress issues   Nutritional Management - describe effects of food on blood glucose; identify sources of carbohydrate, protein and fat; verbalize the importance of balance meals in controlling blood glucose; identify meals as well balanced or not; estimate servings of carbohydrate from menus; use food labels to identify servings size, content of carbohydrate, fiber, protein, fat, saturated fat and sodium; recognize food sources of fat, saturated fat, trans fat, sodium and verbalize goals for intake; describe healthful appropriate food choices when dining out   Exercise - describe the effects of exercise on blood glucose and importance of regular exercise in controlling diabetes; state a plan for personal exercise; verbalize contraindications for exercise  Medications - state name, dose, timing of currently prescribed medications; describe types of medications available for diabetes  Self-Monitoring - state importance of HBGM and demo procedure accurately; use HBGM results to effectively manage diabetes; identify importance of regular HbA1C testing and goals for results  Acute Complications/Sick Day Guidelines - recognize hyperglycemia and hypoglycemia with causes and effects; identify blood glucose results as high, low or in control; list steps in treating and preventing high and low blood glucose; state appropriate measure to manage blood glucose when ill (need for meds, HBGM plan, when to call physician, need for fluids)  Chronic Complications/Foot, Skin, Eye Dental Care - identify possible long-term  complications of diabetes (retinopathy, neuropathy, nephropathy, cardiovascular disease, infections); explain steps in prevention and treatment of chronic complications; state importance of daily self-foot exams; describe how to examine feet and what to look for; explain appropriate eye and dental care  Lifestyle Changes/Goals & Health/Community Resources - state benefits of making appropriate lifestyle changes; identify habits that need to change (meals, tobacco, alcohol); identify strategies to reduce risk factors for personal health; set goals for proper diabetes care; state need for and frequency of healthcare follow-up; describe appropriate community resources for good health (ADA, web sites, apps)   Pregnancy/Sexual Health - define gestational diabetes; state importance of good blood glucose control and birth control prior to pregnancy; state importance of good blood glucose control in preventing sexual problems (impotence, vaginal dryness, infections, loss of desire); state relationship of blood glucose control and pregnancy outcome; describe risk of maternal and fetal complications  Teaching Materials Used: Class 2 Slide Packet A1C Pamphlet Foot Care Literature Menu Ideas Goals for Class 2

## 2015-06-15 ENCOUNTER — Ambulatory Visit: Payer: Commercial Managed Care - HMO | Admitting: Physician Assistant

## 2015-06-15 ENCOUNTER — Other Ambulatory Visit: Payer: Self-pay | Admitting: *Deleted

## 2015-06-15 MED ORDER — TOLTERODINE TARTRATE ER 2 MG PO CP24: 2.0000 mg | ORAL_CAPSULE | Freq: Every day | ORAL | Status: DC

## 2015-06-17 DIAGNOSIS — F312 Bipolar disorder, current episode manic severe with psychotic features: Secondary | ICD-10-CM | POA: Diagnosis not present

## 2015-06-18 ENCOUNTER — Encounter: Payer: Self-pay | Admitting: Dietician

## 2015-06-18 ENCOUNTER — Encounter: Payer: Commercial Managed Care - HMO | Admitting: Dietician

## 2015-06-18 VITALS — BP 136/76 | Ht 63.0 in | Wt 221.0 lb

## 2015-06-18 DIAGNOSIS — E119 Type 2 diabetes mellitus without complications: Secondary | ICD-10-CM | POA: Diagnosis not present

## 2015-06-18 NOTE — Progress Notes (Signed)
Appt. Start Time: 9:00am  Appt. End Time: 12:00  Class 3 Psychosocial - identify DM as a source of stress; state the effects of stress on BG control; verbalize appropriate stress management techniques; identify personal stress issues   Nutritional Management - describe effects of food on blood glucose; identify sources of carbohydrate, protein and fat; verbalize the importance of balance meals in controlling blood glucose; identify meals as well balanced or not; estimate servings of carbohydrate from menus; use food labels to identify servings size, content of carbohydrate, fiber, protein, fat, saturated fat and sodium; recognize food sources of fat, saturated fat, trans fat, sodium and verbalize goals for intake; describe healthful appropriate food choices when dining out   Exercise - describe the effects of exercise on blood glucose and importance of regular exercise in controlling diabetes; state a plan for personal exercise; verbalize contraindications for exercise  Self-Monitoring - state importance of HBGM and demo procedure accurately; use HBGM results to effectively manage diabetes; identify importance of regular HbA1C testing and goals for results  Acute Complications/Sick Day Guidelines - recognize hyperglycemia and hypoglycemia with causes and effects; identify blood glucose results as high, low or in control; list steps in treating and preventing high and low blood glucose; state appropriate measure to manage blood glucose when ill (need for meds, HBGM plan, when to call physician, need for fluids)  Chronic Complications/Foot, Skin, Eye Dental Care - identify possible long-term complications of diabetes (retinopathy, neuropathy, nephropathy, cardiovascular disease, infections); explain steps in prevention and treatment of chronic complications; state importance of daily self-foot exams; describe how to examine feet and what to look for; explain appropriate eye and dental care  Lifestyle  Changes/Goals & Health/Community Resources - state benefits of making appropriate lifestyle changes; identify habits that need to change (meals, tobacco, alcohol); identify strategies to reduce risk factors for personal health; set goals for proper diabetes care; state need for and frequency of healthcare follow-up; describe appropriate community resources for good health (ADA, web sites, apps)   Teaching Materials Used: Class 3 Slide Packet Diabetes Stress Test Stress Management Tools Stress Poem Recipe/Menu Booklet Nutrition Prescription Fast Food Information Goal Setting Worksheet

## 2015-06-19 ENCOUNTER — Encounter: Payer: Self-pay | Admitting: *Deleted

## 2015-06-21 ENCOUNTER — Encounter: Payer: Self-pay | Admitting: Hematology and Oncology

## 2015-06-23 ENCOUNTER — Ambulatory Visit (INDEPENDENT_AMBULATORY_CARE_PROVIDER_SITE_OTHER)
Admission: RE | Admit: 2015-06-23 | Discharge: 2015-06-23 | Disposition: A | Discharge status: Home / Self Care | Payer: Commercial Managed Care - HMO | Source: Ambulatory Visit | Attending: Family Medicine | Admitting: Family Medicine

## 2015-06-23 ENCOUNTER — Encounter: Payer: Self-pay | Admitting: Family Medicine

## 2015-06-23 ENCOUNTER — Ambulatory Visit (INDEPENDENT_AMBULATORY_CARE_PROVIDER_SITE_OTHER): Payer: Commercial Managed Care - HMO | Admitting: Family Medicine

## 2015-06-23 VITALS — BP 136/70 | HR 100 | Temp 97.5°F | Wt 221.2 lb

## 2015-06-23 DIAGNOSIS — S8991XA Unspecified injury of right lower leg, initial encounter: Secondary | ICD-10-CM

## 2015-06-23 MED ORDER — TRAMADOL HCL 50 MG PO TABS
ORAL_TABLET | ORAL | Status: DC
Start: 2015-06-23 — End: 2015-07-20

## 2015-06-23 MED ORDER — CLONAZEPAM 0.5 MG PO TABS: 0.5000 mg | ORAL_TABLET | Freq: Two times a day (BID) | ORAL | Status: DC

## 2015-06-23 MED ORDER — ZOLPIDEM TARTRATE 5 MG PO TABS: 5.0000 mg | ORAL_TABLET | Freq: Every day | ORAL | Status: DC

## 2015-06-23 NOTE — Patient Instructions (Signed)
Great to see you. I will call you with your xray results from today.

## 2015-06-23 NOTE — Progress Notes (Addendum)
SUBJECTIVE: Stephanie [name] is a 65 y.o. female who sustained a right knee injury 2 weeks ago. Mechanism of injury: fell up concrete stairs. Immediate symptoms: immediate pain, immediate swelling, inability to bear weight directly after injury. Symptoms have been constant since that time. Prior history of related problems: no prior problems with this area in the past.  Current Outpatient Prescriptions on File Prior to Visit  Medication Sig Dispense Refill  . ABILIFY 30 MG tablet TAKE 1 TABLET BY MOUTH EVERY MORNING 30 tablet 0  . ACCU-CHEK AVIVA PLUS test strip     . diazepam (VALIUM) 5 MG tablet TAKE 1 TABLET BY MOUTH TWICE DAILY 60 tablet 0  . dicyclomine (BENTYL) 10 MG capsule Take 1 capsule (10 mg total) by mouth 3 (three) times daily before meals. 90 capsule 5  . docusate sodium (COLACE) 100 MG capsule TAKE 1 CAPSULE BY MOUTH 2 TIMES DAILY AS NEEDED FOR CONSTIPATION 60 capsule 5  . lamoTRIgine (LAMICTAL) 100 MG tablet Take 3 tablets by mouth once daily    . Lancets Misc. (ACCU-CHEK SOFTCLIX LANCET DEV) KIT     . metFORMIN (GLUCOPHAGE) 1000 MG tablet TAKE 1 TABLET (1,000 MG TOTAL) BY MOUTH 2 (TWO) TIMES DAILY WITH A MEAL. 180 tablet 1  . metoCLOPramide (REGLAN) 5 MG tablet Take 1 tablet (5 mg total) by mouth 3 (three) times daily. 15 tablet 1  . metroNIDAZOLE (FLAGYL) 250 MG tablet Take 1 tablet (250 mg total) by mouth 4 (four) times daily. 40 tablet 0  . mirtazapine (REMERON) 15 MG tablet TAKE 1 TABLET BY MOUTH AT BEDTIME 34 tablet 2  . nystatin (MYCOSTATIN) powder Apply topically 2 (two) times daily. 15 g 0  . omeprazole (PRILOSEC) 40 MG capsule TAKE ONE CAPSULE BY MOUTH EVERY DAY (Patient taking differently: TAKE two CAPSULE BY MOUTH EVERY DAY) 90 capsule 3  . ondansetron (ZOFRAN ODT) 4 MG disintegrating tablet Take 1 tablet (4 mg total) by mouth every 8 (eight) hours as needed for nausea or vomiting. 50 tablet 1  . pregabalin (LYRICA) 50 MG capsule Take 1 capsule (50 mg total) by mouth 3  (three) times daily. 60 capsule 1  . rOPINIRole (REQUIP) 3 MG tablet TAKE 1 TABLET BY MOUTH AT BEDTIME 90 tablet 0  . simvastatin (ZOCOR) 10 MG tablet TAKE 1 TABLET BY MOUTH AT BEDTIME 90 tablet 1  . SYMBICORT 160-4.5 MCG/ACT inhaler INHALE 2 PUFFS INTO THE LUNGS 2 (TWO) TIMES DAILY. 10.2 Inhaler 0  . tolterodine (DETROL LA) 2 MG 24 hr capsule Take 1 capsule (2 mg total) by mouth daily. 90 capsule 2   No current facility-administered medications on file prior to visit.    Allergies  Allergen Reactions  . Doxycycline Other (See Comments)    Face red and swollen; no difficulty breathing    Past Medical History  Diagnosis Date  . Adenomatous colon polyp 08/27/2014    Polyps x 3  . Insomnia   . RLS (restless legs syndrome)   . Colon polyp   . Incontinence   . H/O: rheumatic fever   . Hyperlipidemia   . Unspecified disorders of nervous system   . Bipolar affective disorder ([place])     h/o  . H/O: CVA (cardiovascular accident)     TIA  . Fatty liver 04/09/08    found in abd CT  . Shortness of breath   . Obstructive sleep apnea     not using CPAP, last sleep study 2.5 years ago, per pt doctor  is aware  . Blood transfusion 2000  . GERD (gastroesophageal reflux disease)   . Arthritis   . 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 Asthma   . Anxiety   . Depression   . Stroke [facility])     TIA- 2002  . Ulcer   . Diabetes mellitus without complication ([place])   . Anemia   . Blood transfusion without reported diagnosis   . Sleep apnea   . Platelets decreased ([place])   . 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 Back surgery      x 45  . Cholecystectomy    . Rotator cuff repair      left  . Abdominal hysterectomy      total  . Lumbar fusion  10/09  . Hemorroidectomy      and colon polyp removed  . Abdominal exploration surgery    . Posterior cervical fusion/foraminotomy    . Eye surgery      bilat  . Lumbar wound debridement   05/09/2011    Procedure: LUMBAR WOUND DEBRIDEMENT;  Surgeon: Dahlia Bailiff;  Location: [facility];  Service: Orthopedics;  Laterality: N/A;  IRRIGATION AND DEBRIDEMENT SPINAL WOUND  . Axillary artery cannulation via 8-mm hemashield graft, median sternotomy, extracorporeal circulation with deep hypothermic circulatory arrest, repair of  aortic dessection  01/23/2009    Dr Arlyce Dice  . Cataract      both eyes  . Colonoscopy    . Liver biopsy  04/10/2012    Procedure: LIVER BIOPSY;  Surgeon: Inda Castle, MD;  Location: [facility];  Service: Endoscopy;  Laterality: N/A;  ultrasound to mark order for abd limited/liver to be marked to be sent by [email or web address]     Family History  Problem Relation Age of Onset  . Colon cancer Neg Hx   . Esophageal cancer Neg Hx   . Rectal cancer Neg Hx   . Breast cancer Mother   . Lung cancer Mother   . Stomach cancer Father   . Diabetes      4 aunts, and 1 uncle    Social History   Social History  . Marital Status: Single    Spouse Name: N/A  . Number of Children: 2  . Years of Education: N/A   Occupational History  . Disabled    Social History Main Topics  . Smoking status: Former Smoker -- 0.50 packs/day for 50 years    Types: Cigarettes    Quit date: 04/06/2013  . Smokeless tobacco: Never Used  . Alcohol Use: No  . Drug Use: No  . Sexual Activity: Not on file   Other Topics Concern  . Not on file   Social History Narrative   1 son, 72+ y/o   Daily caffeine use: 2 cups daily   Does not get regular exercise   Disability due to bipolar      Has a living will- she is a DNR (she has form at home per pt).   The PMH, PSH, Social History, Family History, Medications, and allergies have been reviewed in [employer], and have been updated if relevant.  OBJECTIVE: BP 136/70 mmHg  Pulse 100  Temp(Src) 97.5 F (36.4 C) (Oral)  Wt 221 lb 4 oz (100.358 kg)  SpO2 97%  Vital signs as noted above. Appearance: alert, well appearing, and in no distress,  oriented to person, place, and time and normal  appearing weight. Knee exam: antalgic gait, ecchymosis of knee cap, effusion, reduced range of motion. X-ray: ordered, but results not yet available.  ASSESSMENT: Knee ligament injury and tendon injury  PLAN: rest the injured area as much as practical, apply ice packs, elevate the injured limb, X-Ray ordered, walking with cane. See orders for this visit as documented in the electronic medical record.

## 2015-06-24 ENCOUNTER — Ambulatory Visit (INDEPENDENT_AMBULATORY_CARE_PROVIDER_SITE_OTHER): Payer: Commercial Managed Care - HMO | Admitting: Family Medicine

## 2015-06-24 ENCOUNTER — Encounter: Payer: Self-pay | Admitting: Family Medicine

## 2015-06-24 VITALS — BP 104/60 | HR 100 | Temp 98.1°F | Ht 63.0 in | Wt 221.5 lb

## 2015-06-24 DIAGNOSIS — S39011A Strain of muscle, fascia and tendon of abdomen, initial encounter: Secondary | ICD-10-CM

## 2015-06-24 DIAGNOSIS — S76219A Strain of adductor muscle, fascia and tendon of unspecified thigh, initial encounter: Secondary | ICD-10-CM

## 2015-06-24 DIAGNOSIS — S8991XD Unspecified injury of right lower leg, subsequent encounter: Secondary | ICD-10-CM | POA: Diagnosis not present

## 2015-06-24 NOTE — Progress Notes (Signed)
Pre visit review using our clinic review tool, if applicable. No additional management support is needed unless otherwise documented below in the visit note. 

## 2015-06-24 NOTE — Progress Notes (Signed)
Dr. Frederico Hamman T. Adley Mazurowski, MD, [facility] [place], [address] Phone: [phone] Fax: [phone]  06/24/2015  Patient: Stephanie [name], MRN: [id number], DOB: 1951/01/05, 65 y.o.  Primary Physician:  [name], MD   Chief Complaint  Patient presents with  . Knee Pain    Right Knee-Fell 05/11/15   Subjective:   Stephanie [name] is a 7 y.o. very pleasant female patient who presents with the following:  Patient with cirrhosis, thrombocytopenia, diabetes, bipolar illness presents in consultation from Dr. Deborra Medina after traumatic injury to he R knee.  Tripped herself and fell on her knees. L knee is not that bad. Twisted her R knee.   06/11/2015 was DOI.   Less than 2 weeks ago the patient tripped and fell and landed on both of her knees.  She skinned her knee on the left, but this one does not really hurt very bad.  At the same time she twisted her right knee and directly struck the anterior aspect of her knee.  Since then she has had a mild to moderate effusion and she has had some difficulty walking.  She has been using a cane, but she has historically used a cane for the last 3-4 years after a prior back operation.  She has pain with ambulation.  Decreased range of motion.  There is no bruising.  Nonweightbearing x-rays were reviewed by myself independently, and the patient has minimal osteoarthritic changes overall.  There is no evidence for acute fracture.  Overall, relatively unremarkable plain x-rays.  Past Medical History, Surgical History, Social History, Family History, Problem List, Medications, and Allergies have been reviewed and updated if relevant.  Patient Active Problem List   Diagnosis Date Noted  . Neuropathy ([place]) 06/08/2015  . Abdominal pain 05/14/2015  . Melena 05/14/2015  . Accidental fall from chair 03/30/2015  . Chest pain 02/25/2015  . Myalgia and myositis 02/11/2015  . Yeast infection 12/15/2014    . Intertrigo 12/15/2014  . Thrombocytopenia ([place]) 08/19/2014  . IGTN (ingrowing toe nail) 08/19/2014  . Abdominal pain, chronic, epigastric 06/12/2014  . Elevated blood pressure 03/27/2014  . Liver cirrhosis secondary to NASH 02/27/2014  . Abnormal chest CT 02/21/2014  . Chest wall pain 12/29/2013  . Dizziness and giddiness 10/30/2013  . DOE (dyspnea on exertion) 10/30/2013  . Nausea alone 10/30/2013  . Diabetes mellitus type 2, controlled, with complications ([place]) 20/94/7096  . DNR (do not resuscitate) 07/29/2013  . Encounter for routine gynecological examination 07/29/2013  . Dyspnea 07/10/2013  . Lung nodule 07/10/2013  . Right foot pain 05/20/2013  . Varicose veins of lower extremities with other complications 28/36/6294  . GERD (gastroesophageal reflux disease) 10/09/2012  . Cirrhosis of liver ([place]) 02/02/2012  . Anxiety 07/11/2011  . Aortic dissection ([place])   . BACK PAIN, LUMBAR, CHRONIC 11/19/2009  . UNS ADVRS EFF OTH RX MEDICINAL&BIOLOGICAL SBSTNC 12/16/2008  . UNSPECIFIED VITAMIN D DEFICIENCY 09/26/2008  . TOBACCO ABUSE 06/19/2008  . RESTLESS LEGS SYNDROME 04/27/2007  . INSOMNIA 04/27/2007  . Benign neoplasm of colon 04/04/2007  . HLD (hyperlipidemia) 03/08/2007  . OSA (obstructive sleep apnea) 03/08/2007  . Essential hypertension 03/08/2007  . Asthma 03/08/2007  . BIPOLAR AFFECTIVE DISORDER, HX OF 03/08/2007    Past Medical History  Diagnosis Date  . Adenomatous colon polyp 08/27/2014    Polyps x 3  . Insomnia   . RLS (restless legs syndrome)   . Colon polyp   .  Incontinence   . H/O: rheumatic fever   . Hyperlipidemia   . Unspecified disorders of nervous system   . Bipolar affective disorder ([place])     h/o  . H/O: CVA (cardiovascular accident)     TIA  . Fatty liver 04/09/08    found in abd CT  . Shortness of breath   . Obstructive sleep apnea     not using CPAP, last sleep study 2.5 years ago, per pt doctor is aware  . Blood transfusion 2000  . GERD  (gastroesophageal reflux disease)   . Arthritis   . 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 Asthma   . Anxiety   . Depression   . Stroke [facility])     TIA- 2002  . Ulcer   . Diabetes mellitus without complication ([place])   . Anemia   . Blood transfusion without reported diagnosis   . Sleep apnea   . Platelets decreased ([place])   . 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 Back surgery      x 45  . Cholecystectomy    . Rotator cuff repair      left  . Abdominal hysterectomy      total  . Lumbar fusion  10/09  . Hemorroidectomy      and colon polyp removed  . Abdominal exploration surgery    . Posterior cervical fusion/foraminotomy    . Eye surgery      bilat  . Lumbar wound debridement  05/09/2011    Procedure: LUMBAR WOUND DEBRIDEMENT;  Surgeon: Dahlia Bailiff;  Location: [facility];  Service: Orthopedics;  Laterality: N/A;  IRRIGATION AND DEBRIDEMENT SPINAL WOUND  . Axillary artery cannulation via 8-mm hemashield graft, median sternotomy, extracorporeal circulation with deep hypothermic circulatory arrest, repair of  aortic dessection  01/23/2009    Dr Arlyce Dice  . Cataract      both eyes  . Colonoscopy    . Liver biopsy  04/10/2012    Procedure: LIVER BIOPSY;  Surgeon: Inda Castle, MD;  [facility];  Service: Endoscopy;  Laterality: N/A;  ultrasound to mark order for abd limited/liver to be marked to be sent by [email or web address]     Social History   Social History  . Marital Status: Single    Spouse Name: N/A  . Number of Children: 2  . Years of Education: N/A   Occupational History  . Disabled    Social History Main Topics  . Smoking status: Former Smoker -- 0.50 packs/day for 50 years    Types: Cigarettes    Quit date: 04/06/2013  . Smokeless tobacco: Never Used  . Alcohol Use: No  . Drug Use: No  . Sexual Activity: Not on file   Other Topics Concern  . Not on file   Social History Narrative   1 son, 73+  y/o   Daily caffeine use: 2 cups daily   Does not get regular exercise   Disability due to bipolar      Has a living will- she is a DNR (she has form at home per pt).    Family History  Problem Relation Age of Onset  . Colon cancer Neg Hx   . Esophageal cancer Neg Hx   . Rectal cancer Neg Hx   . Breast cancer Mother   . Lung cancer Mother   . Stomach cancer Father   . Diabetes  4 aunts, and 1 uncle    Allergies  Allergen Reactions  . Doxycycline Other (See Comments)    Face red and swollen; no difficulty breathing    Medication list reviewed and updated in full in [facility].  GEN: No fevers, chills. Nontoxic. Primarily MSK c/o today. MSK: Detailed in the HPI GI: tolerating PO intake without difficulty Neuro: No numbness, parasthesias, or tingling associated. Otherwise the pertinent positives of the ROS are noted above.   Objective:   BP 104/60 mmHg  Pulse 100  Temp(Src) 98.1 F (36.7 C) (Oral)  Ht 5' 3" (1.6 m)  Wt 221 lb 8 oz (100.472 kg)  BMI 39.25 kg/m2   GEN: WDWN, NAD, Non-toxic, Alert & Oriented x 3 HEENT: Atraumatic, Normocephalic.  Ears and Nose: No external deformity. EXTR: No clubbing/cyanosis/edema NEURO: antalgic gait PSYCH: Normally interactive. Conversant. Not depressed or anxious appearing.  Calm demeanor.    Right knee lacks 5 of extension.  Flexion to 95.  She has a mild to moderate effusion.  Nontender along the patellar tendon.  Extension and flexion remain intact.  Tenderness on both medial and lateral joint lines.  MCL and LCL appear grossly intact, but pain limits exam.  Unable to fully assess ACL and PCL.  There is some minor tenderness around the tibial plateau.  Additionally at the tibial tubercle.  The patient also is notably tender along her sartorius and along all of her hip abductors and hip flexors.  Hip flexion is painful as well as abduction with 4/5 pain on the right.  Radiology: Dg Knee Complete 4 Views  Right  06/23/2015  CLINICAL DATA:  Right knee pain after fall.  Initial evaluation . EXAM: RIGHT KNEE - COMPLETE 4+ VIEW COMPARISON:  08/06/2012. FINDINGS: Mild tricompartment degenerative change. No acute bony abnormality identified. Small knee joint effusion. IMPRESSION: Mild tricompartment degenerative change. No evidence fracture or dislocation.Small knee joint effusion. Electronically Signed   By: Marcello Moores  Register   On: 06/23/2015 14:15   Comprehensive Metabolic Panel:    Component Value Date/Time   NA 137 05/12/2015 1228   NA 138 05/03/2014 0746   K 3.8 05/12/2015 1228   K 3.9 05/03/2014 0746   CL 104 05/12/2015 1228   CL 103 05/03/2014 0746   CO2 26 05/12/2015 1228   CO2 28 05/03/2014 0746   BUN 8 05/12/2015 1228   BUN 11 05/03/2014 0746   CREATININE 0.78 05/12/2015 1228   CREATININE 0.73 09/29/2014 1019   GLUCOSE 240* 05/12/2015 1228   GLUCOSE 122* 05/03/2014 0746   CALCIUM 9.1 05/12/2015 1228   CALCIUM 9.0 05/03/2014 0746   AST 60* 05/12/2015 1228   AST 50* 12/26/2013 0924   ALT 18 05/12/2015 1228   ALT 41 12/26/2013 0924   ALKPHOS 150* 05/12/2015 1228   ALKPHOS 151* 12/26/2013 0924   BILITOT 0.8 05/12/2015 1228   BILITOT 0.6 12/26/2013 0924   PROT 7.0 05/12/2015 1228   PROT 7.0 12/26/2013 0924   ALBUMIN 3.2* 05/12/2015 1228   ALBUMIN 2.8* 12/26/2013 0924     CBC:    Component Value Date/Time   WBC 4.1 05/12/2015 1228   WBC 4.7 09/29/2014 0920   HGB 10.8* 05/12/2015 1228   HGB 11.7* 09/29/2014 0920   HCT 33.5* 05/12/2015 1228   HCT 34.7* 09/29/2014 0920   PLT 132* 05/12/2015 1228   PLT 102* 09/29/2014 0920   MCV 88.5 05/12/2015 1228   MCV 93 09/29/2014 0920   NEUTROABS 3.3 05/11/2015 0923   NEUTROABS  3.1 09/29/2014 0920   LYMPHSABS 1.0 05/11/2015 0923   LYMPHSABS 0.9* 09/29/2014 0920   MONOABS 0.5 05/11/2015 0923   MONOABS 0.4 09/29/2014 0920   EOSABS 0.1 05/11/2015 0923   EOSABS 0.2 09/29/2014 0920   BASOSABS 0.1 05/11/2015 0923   BASOSABS 0.1  09/29/2014 0920    Lab Results  Component Value Date   HGBA1C 7.1* 04/20/2015     Assessment and Plan:   Right knee injury, subsequent encounter  Groin strain, initial encounter  Exam is difficult posttrauma.  I'm going to let her knee calm down over time and recheck in 3 weeks to assess stability.  Probable that bone contusion plays a significant role here.  Other internal derangement cannot be excluded.  Hip flexor and hip abductor strain is playing a significant role in her pain also.  I was able to give the patient a patellar-J brace.  This was an extra brace that I had in the office that was returned by a previous patient, so I gave it to the patient without charge.  Follow-up: Return in about 3 weeks (around 07/15/2015).  Signed,   T. , MD   Patient's Medications  New Prescriptions   No medications on file  Previous Medications   ACCU-CHEK AVIVA PLUS TEST STRIP       ARIPIPRAZOLE (ABILIFY) 10 MG TABLET       ARIPIPRAZOLE (ABILIFY) 20 MG TABLET       CLONAZEPAM (KLONOPIN) 0.5 MG TABLET    Take 1 tablet (0.5 mg total) by mouth 2 (two) times daily.   DIAZEPAM (VALIUM) 5 MG TABLET    TAKE 1 TABLET BY MOUTH TWICE DAILY   DICYCLOMINE (BENTYL) 10 MG CAPSULE    Take 1 capsule (10 mg total) by mouth 3 (three) times daily before meals.   DOCUSATE SODIUM (COLACE) 100 MG CAPSULE    TAKE 1 CAPSULE BY MOUTH 2 TIMES DAILY AS NEEDED FOR CONSTIPATION   LAMOTRIGINE (LAMICTAL) 100 MG TABLET    Take 3 tablets by mouth once daily   LANCETS MISC. (ACCU-CHEK SOFTCLIX LANCET DEV) KIT       METFORMIN (GLUCOPHAGE) 1000 MG TABLET    TAKE 1 TABLET (1,000 MG TOTAL) BY MOUTH 2 (TWO) TIMES DAILY WITH A MEAL.   METOCLOPRAMIDE (REGLAN) 5 MG TABLET    Take 1 tablet (5 mg total) by mouth 3 (three) times daily.   METRONIDAZOLE (FLAGYL) 250 MG TABLET    Take 1 tablet (250 mg total) by mouth 4 (four) times daily.   MIRTAZAPINE (REMERON) 15 MG TABLET    TAKE 1 TABLET BY MOUTH AT BEDTIME    NYSTATIN (MYCOSTATIN) POWDER    Apply topically 2 (two) times daily.   OMEPRAZOLE (PRILOSEC) 40 MG CAPSULE    TAKE ONE CAPSULE BY MOUTH EVERY DAY   ONDANSETRON (ZOFRAN ODT) 4 MG DISINTEGRATING TABLET    Take 1 tablet (4 mg total) by mouth every 8 (eight) hours as needed for nausea or vomiting.   PREGABALIN (LYRICA) 50 MG CAPSULE    Take 1 capsule (50 mg total) by mouth 3 (three) times daily.   ROPINIROLE (REQUIP) 3 MG TABLET    TAKE 1 TABLET BY MOUTH AT BEDTIME   SIMVASTATIN (ZOCOR) 10 MG TABLET    TAKE 1 TABLET BY MOUTH AT BEDTIME   SYMBICORT 160-4.5 MCG/ACT INHALER    INHALE 2 PUFFS INTO THE LUNGS 2 (TWO) TIMES DAILY.   TOLTERODINE (DETROL LA) 2 MG 24 HR CAPSULE    Take 1   capsule (2 mg total) by mouth daily.   TRAMADOL (ULTRAM) 50 MG TABLET    TAKE 2 TABLETS BY MOUTH EVERY MORNING AND 2 TABLETS BY MOUTH EVERY EVENING   ZOLPIDEM (AMBIEN) 5 MG TABLET    Take 1 tablet (5 mg total) by mouth at bedtime.  Modified Medications   No medications on file  Discontinued Medications   ABILIFY 30 MG TABLET    TAKE 1 TABLET BY MOUTH EVERY MORNING       

## 2015-06-25 ENCOUNTER — Encounter: Payer: Self-pay | Admitting: Podiatry

## 2015-06-25 ENCOUNTER — Ambulatory Visit (INDEPENDENT_AMBULATORY_CARE_PROVIDER_SITE_OTHER): Payer: Commercial Managed Care - HMO | Admitting: Podiatry

## 2015-06-25 VITALS — BP 137/69 | HR 96 | Resp 18

## 2015-06-25 DIAGNOSIS — L84 Corns and callosities: Secondary | ICD-10-CM

## 2015-06-25 DIAGNOSIS — G629 Polyneuropathy, unspecified: Secondary | ICD-10-CM

## 2015-06-25 DIAGNOSIS — E1149 Type 2 diabetes mellitus with other diabetic neurological complication: Secondary | ICD-10-CM

## 2015-06-25 NOTE — Progress Notes (Signed)
Patient ID: TYSHAWN KEEL, female   DOB: [birth year], 65 y.o.   MRN: [id number]  Subjective: 65 y.o. returns the office today for follow-up evaluation of neuropathy to her feet. She feels that the Lyrica is helping. She is getting pain to the calluses on the bottom of her feet which is limiting her from wearing her regular shoe. She denies any swelling, redness or any drainage from around these areas. No swelling to the foot. No recent injury or trauma. No other complaints at this time. Denies any systemic complaints such as fevers, chills, nausea, vomiting.   Since last appointment she did fall injuring her right knee, and she is wearing a brace on the knee. She does feel the knee brace is making her foot numb.   Objective: AAO 3, NAD DP/PT pulses palpable, CRT less than 3 seconds Protective sensation decreased with Simms Weinstein monofilament At this time there is no tenderness to palpation upon the metatarsals or to other areas of the foot. The only areas of tenderness that she has is overlying hyperkeratotic lesions. Lesions are localized to right medial first MTPJ, submetatarsal 3, so metatarsal 1. Upon debridement the lesion there is no underlying ulceration, drainage or other signs of infection. There is no edema to the foot or any erythema or increase in warmth. No other open lesions or pre-ulcer lesions identified bilaterally. No pain with calf compression, swelling, warmth, erythema.  Assessment: Patient presents with symptomatic hyperkerotic lesions, neuropathy; results pain.  Plan: -Treatment options including alternatives, risks, complications were discussed -Continue Lyrica. -As her symptoms are resolved stress fracture unlikely. We'll hold off on further x-rays. She can start to transition to a regular shoe. If unable to do so to call the office for further evaluation. -Follow-up with orthopedics/sports medicine for knee.  -Follow-up in 3 months or sooner if any problems  arise. In the meantime, encouraged to call the office with any questions, concerns, change in symptoms.   Celesta Gentile, DPM

## 2015-06-26 ENCOUNTER — Telehealth: Payer: Self-pay | Admitting: *Deleted

## 2015-06-26 MED ORDER — ACCU-CHEK AVIVA PLUS VI STRP: ORAL_STRIP | Status: DC

## 2015-06-26 NOTE — Telephone Encounter (Signed)
Pt notified refill already done. Pt voiced understanding.

## 2015-06-26 NOTE — Telephone Encounter (Addendum)
Pt left v/m requesting status of test strips refilled. Phone remains busy after multiple attempts to contact pt. Will try later. Refill has been done as requested.

## 2015-06-30 ENCOUNTER — Ambulatory Visit (INDEPENDENT_AMBULATORY_CARE_PROVIDER_SITE_OTHER): Payer: Commercial Managed Care - HMO | Admitting: Thoracic Surgery (Cardiothoracic Vascular Surgery)

## 2015-06-30 ENCOUNTER — Ambulatory Visit
Admission: RE | Admit: 2015-06-30 | Discharge: 2015-06-30 | Disposition: A | Discharge status: Home / Self Care | Payer: Commercial Managed Care - HMO | Source: Ambulatory Visit | Attending: Thoracic Surgery (Cardiothoracic Vascular Surgery) | Admitting: Thoracic Surgery (Cardiothoracic Vascular Surgery)

## 2015-06-30 ENCOUNTER — Telehealth: Payer: Self-pay

## 2015-06-30 ENCOUNTER — Encounter: Payer: Self-pay | Admitting: Thoracic Surgery (Cardiothoracic Vascular Surgery)

## 2015-06-30 VITALS — BP 136/72 | HR 99 | Resp 16 | Ht 63.0 in | Wt 221.0 lb

## 2015-06-30 DIAGNOSIS — R911 Solitary pulmonary nodule: Secondary | ICD-10-CM

## 2015-06-30 NOTE — Progress Notes (Signed)
Stephanie [name]       [place] [address]             [phone]       HPI: Stephanie [name] returns for an annual follow-up visit.  She is a 65 year old woman with multiple medical problems including cirrhosis, ascites, COPD, sleep apnea, and bipolar disorder among others. She also has a history of heavy tobacco abuse smoking about one half packs of cigarettes daily for 50 years before quitting in 2014.  She was found to have a small right apical lung nodule in October of 2011. It was followed by Dr. Arlyce Dice before he retired. He recommended one final scan to be done. On that scan there was a slight increase in the size of the groundglass opacity in the right apex to 8 mm, so I continued to follow her for that. She was last seen in the office in January 2016. The nodule was unchanged. As it had been 3 years, the nodules felt to be benign.  She continues to complain of shortness of breath, lack of energy and difficulty sleeping. She is very anxious. She quit smoking in November 2014 and then started again, before quitting for good about a year ago. She still has cravings. Her chronic cough is unchanged. She denies chest pain, pressure, or tightness with exertion. She does get short of breath with exertion, that is unchanged.  Past Medical History  Diagnosis Date  . Adenomatous colon polyp 08/27/2014    Polyps x 3  . Insomnia   . RLS (restless legs syndrome)   . Colon polyp   . Incontinence   . H/O: rheumatic fever   . Hyperlipidemia   . Unspecified disorders of nervous system   . Bipolar affective disorder ([place])     h/o  . H/O: CVA (cardiovascular accident)     TIA  . Fatty liver 04/09/08    found in abd CT  . Shortness of breath   . Obstructive sleep apnea     not using CPAP, last sleep study 2.5 years ago, per pt doctor is aware  . Blood transfusion 2000  . GERD (gastroesophageal reflux disease)   . Arthritis   . 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 Asthma     . Anxiety   . Depression   . Stroke [facility])     TIA- 2002  . Ulcer   . Diabetes mellitus without complication ([place])   . Anemia   . Blood transfusion without reported diagnosis   . Sleep apnea   . Platelets decreased ([place])   . 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 ACCU-CHEK AVIVA PLUS test strip Use as instructed to test blood sugar once daily E11.8 100 each 1  . ARIPiprazole (ABILIFY) 10 MG tablet     . ARIPiprazole (ABILIFY) 20 MG tablet     . clonazePAM (KLONOPIN) 0.5 MG tablet Take 1 tablet (0.5 mg total) by mouth 2 (two) times daily. 60 tablet 0  . diazepam (VALIUM) 5 MG tablet TAKE 1 TABLET BY MOUTH TWICE DAILY 60 tablet 0  . dicyclomine (BENTYL) 10 MG capsule Take 1 capsule (10 mg total) by mouth 3 (three) times daily before meals. 90 capsule 5  . docusate sodium (COLACE) 100 MG capsule TAKE 1 CAPSULE BY MOUTH 2 TIMES DAILY AS NEEDED FOR CONSTIPATION 60 capsule 5  .  lamoTRIgine (LAMICTAL) 100 MG tablet Take 3 tablets by mouth once daily    . Lancets Misc. (ACCU-CHEK SOFTCLIX LANCET DEV) KIT     . metFORMIN (GLUCOPHAGE) 1000 MG tablet TAKE 1 TABLET (1,000 MG TOTAL) BY MOUTH 2 (TWO) TIMES DAILY WITH A MEAL. 180 tablet 1  . metoCLOPramide (REGLAN) 5 MG tablet Take 1 tablet (5 mg total) by mouth 3 (three) times daily. 15 tablet 1  . mirtazapine (REMERON) 15 MG tablet TAKE 1 TABLET BY MOUTH AT BEDTIME 34 tablet 2  . nystatin (MYCOSTATIN) powder Apply topically 2 (two) times daily. 15 g 0  . omeprazole (PRILOSEC) 40 MG capsule TAKE ONE CAPSULE BY MOUTH EVERY DAY (Patient taking differently: TAKE two CAPSULE BY MOUTH EVERY DAY) 90 capsule 3  . ondansetron (ZOFRAN ODT) 4 MG disintegrating tablet Take 1 tablet (4 mg total) by mouth every 8 (eight) hours as needed for nausea or vomiting. 50 tablet 1  . pregabalin (LYRICA) 50 MG capsule Take 1 capsule (50 mg total) by mouth 3 (three) times daily. 60 capsule 1  .  rOPINIRole (REQUIP) 3 MG tablet TAKE 1 TABLET BY MOUTH AT BEDTIME 90 tablet 0  . simvastatin (ZOCOR) 10 MG tablet TAKE 1 TABLET BY MOUTH AT BEDTIME 90 tablet 1  . SYMBICORT 160-4.5 MCG/ACT inhaler INHALE 2 PUFFS INTO THE LUNGS 2 (TWO) TIMES DAILY. 10.2 Inhaler 0  . tolterodine (DETROL LA) 2 MG 24 hr capsule Take 1 capsule (2 mg total) by mouth daily. 90 capsule 2  . traMADol (ULTRAM) 50 MG tablet TAKE 2 TABLETS BY MOUTH EVERY MORNING AND 2 TABLETS BY MOUTH EVERY EVENING 120 tablet 0  . zolpidem (AMBIEN) 5 MG tablet Take 1 tablet (5 mg total) by mouth at bedtime. 30 tablet 0   No current facility-administered medications for this visit.    Physical Exam Obese 65 year old woman in no acute distress Alert and oriented 3 with no focal deficits No cervical or supraclavicular adenopathy Lungs diminished breath sounds bilaterally, no wheezing Cardiac regular rate and rhythm normal S1 and S2  Diagnostic Tests: I personally reviewed her screening CT from today and concur with the radiologist findings. The right apical nodule is unchanged. (It measured 7 mm today). There are no other suspicious nodules. The report is not available in Epic.  Impression: 65 year old woman with a history of heavy tobacco abuse who has multiple small groundglass opacities in both lungs, the largest being a 7 mm right apical nodule that is now unchanged for 4 years. There are no suspicious findings on her low-dose screening CT today. Given her smoking history she needs continued annual low-dose screening.  Smoking cessation instruction/counseling given:  commended patient for quitting and reviewed strategies for preventing relapses   Plan: Return in 1 year with low-dose screening CT   I spent 10 minutes face-to-face with Stephanie [name] during this visit, greater than 50% spent in counseling.   Melrose Nakayama, MD [facility] [phone]

## 2015-06-30 NOTE — Telephone Encounter (Signed)
Pt left v/m requesting cb about ins question. Left v/m requesting cb.

## 2015-07-01 MED ORDER — ACCU-CHEK AVIVA PLUS VI STRP: ORAL_STRIP | Status: DC

## 2015-07-01 NOTE — Telephone Encounter (Signed)
Spoke with pt and the ins question was about test strips; the charge at local pharmacy would be over $100.00. Advised pt refill sent as requested to [facility]. Pt voiced understanding.

## 2015-07-01 NOTE — Telephone Encounter (Signed)
Pt left v/m requesting accu chek test strips sent to [facility]. Refill done as requested. Pt request cb. Left v/m requesting cb and to verify this is what pt called about 06/30/15.

## 2015-07-04 ENCOUNTER — Other Ambulatory Visit: Payer: Self-pay | Admitting: Family Medicine

## 2015-07-07 ENCOUNTER — Other Ambulatory Visit: Payer: Self-pay | Admitting: Gastroenterology

## 2015-07-09 ENCOUNTER — Telehealth: Payer: Self-pay

## 2015-07-09 NOTE — Telephone Encounter (Signed)
Pt left v/m received med in mail pt did not order and request cb. That was entire message. Contact # remains busy after several tries.

## 2015-07-10 ENCOUNTER — Telehealth: Payer: Self-pay | Admitting: *Deleted

## 2015-07-10 MED ORDER — ACCU-CHEK AVIVA PLUS VI STRP: ORAL_STRIP | Status: DC

## 2015-07-10 NOTE — Telephone Encounter (Signed)
Pt left voicemail at Triage requesting Rx be sent to [facility] not [facility], pt's insurance has changed, done

## 2015-07-13 ENCOUNTER — Ambulatory Visit (INDEPENDENT_AMBULATORY_CARE_PROVIDER_SITE_OTHER): Payer: Medicare Other | Admitting: Family Medicine

## 2015-07-13 ENCOUNTER — Encounter: Payer: Self-pay | Admitting: Family Medicine

## 2015-07-13 VITALS — BP 118/72 | HR 88 | Temp 97.4°F | Ht 63.0 in | Wt 220.5 lb

## 2015-07-13 DIAGNOSIS — E118 Type 2 diabetes mellitus with unspecified complications: Secondary | ICD-10-CM | POA: Diagnosis not present

## 2015-07-13 DIAGNOSIS — S8991XD Unspecified injury of right lower leg, subsequent encounter: Secondary | ICD-10-CM

## 2015-07-13 DIAGNOSIS — M25561 Pain in right knee: Secondary | ICD-10-CM

## 2015-07-13 DIAGNOSIS — K7460 Unspecified cirrhosis of liver: Secondary | ICD-10-CM | POA: Diagnosis not present

## 2015-07-13 MED ORDER — METHYLPREDNISOLONE ACETATE 40 MG/ML IJ SUSP
80.0000 mg | Freq: Once | INTRAMUSCULAR | Status: AC
  Administered 2015-07-13: 80 mg via INTRA_ARTICULAR

## 2015-07-13 NOTE — Progress Notes (Signed)
Pre visit review using our clinic review tool, if applicable. No additional management support is needed unless otherwise documented below in the visit note. 

## 2015-07-13 NOTE — Progress Notes (Signed)
Dr. Frederico Hamman T. Kashayla Ungerer, MD, [facility] [place], [address] Phone: [phone] Fax: [phone]  07/13/2015  Patient: Stephanie [name], MRN: [id number], DOB: 1951/01/25, 65 y.o.  Primary Physician:  [name], MD   Chief Complaint  Patient presents with  . Follow-up    Right Knee   Subjective:   Stephanie [name] is a 9 y.o. very pleasant female patient who presents with the following:  F/u R knee: Complicated medical patient as below.  Knee is cracking, popping, stinging and hurting. Feels something move. Not slept at all in a couple of days. Her knee is feeling better overall, her movement is better, she is no longer having an effusion. She does feel some crepitus in her knee. She has been wearing her knee brace, but as seeded visually, she is not wearing her knee brace properly.  She still is using a cane is having some difficulty walking compared to baseline  R knee injection.    06/24/2015 Last OV with Owens Loffler, MD  Patient with cirrhosis, thrombocytopenia, diabetes, bipolar illness presents in consultation from Dr. Deborra Medina after traumatic injury to he R knee.  Tripped herself and fell on her knees. L knee is not that bad. Twisted her R knee.   06/11/2015 was DOI.   Less than 2 weeks ago the patient tripped and fell and landed on both of her knees.  She skinned her knee on the left, but this one does not really hurt very bad.  At the same time she twisted her right knee and directly struck the anterior aspect of her knee.  Since then she has had a mild to moderate effusion and she has had some difficulty walking.  She has been using a cane, but she has historically used a cane for the last 3-4 years after a prior back operation.  She has pain with ambulation.  Decreased range of motion.  There is no bruising.  Nonweightbearing x-rays were reviewed by myself independently, and the patient has minimal  osteoarthritic changes overall.  There is no evidence for acute fracture.  Overall, relatively unremarkable plain x-rays.  Past Medical History, Surgical History, Social History, Family History, Problem List, Medications, and Allergies have been reviewed and updated if relevant.  Patient Active Problem List   Diagnosis Date Noted  . Neuropathy ([place]) 06/08/2015  . Abdominal pain 05/14/2015  . Melena 05/14/2015  . Accidental fall from chair 03/30/2015  . Chest pain 02/25/2015  . Myalgia and myositis 02/11/2015  . Yeast infection 12/15/2014  . Intertrigo 12/15/2014  . Thrombocytopenia ([place]) 08/19/2014  . IGTN (ingrowing toe nail) 08/19/2014  . Abdominal pain, chronic, epigastric 06/12/2014  . Elevated blood pressure 03/27/2014  . Liver cirrhosis secondary to NASH 02/27/2014  . Abnormal chest CT 02/21/2014  . Chest wall pain 12/29/2013  . Dizziness and giddiness 10/30/2013  . DOE (dyspnea on exertion) 10/30/2013  . Nausea alone 10/30/2013  . Diabetes mellitus type 2, controlled, with complications ([place]) 28/41/3244  . DNR (do not resuscitate) 07/29/2013  . Encounter for routine gynecological examination 07/29/2013  . Dyspnea 07/10/2013  . Lung nodule 07/10/2013  . Right foot pain 05/20/2013  . Varicose veins of lower extremities with other complications 06/08/7251  . GERD (gastroesophageal reflux disease) 10/09/2012  . Cirrhosis of liver ([place]) 02/02/2012  . Anxiety 07/11/2011  . Aortic dissection ([place])   . BACK PAIN, LUMBAR, CHRONIC 11/19/2009  . UNS ADVRS EFF OTH RX MEDICINAL&BIOLOGICAL  [place] 12/16/2008  . UNSPECIFIED VITAMIN D DEFICIENCY 09/26/2008  . TOBACCO ABUSE 06/19/2008  . RESTLESS LEGS SYNDROME 04/27/2007  . INSOMNIA 04/27/2007  . Benign neoplasm of colon 04/04/2007  . HLD (hyperlipidemia) 03/08/2007  . OSA (obstructive sleep apnea) 03/08/2007  . Essential hypertension 03/08/2007  . Asthma 03/08/2007  . BIPOLAR AFFECTIVE DISORDER, HX OF 03/08/2007    Past Medical  History  Diagnosis Date  . Adenomatous colon polyp 08/27/2014    Polyps x 3  . Insomnia   . RLS (restless legs syndrome)   . Colon polyp   . Incontinence   . H/O: rheumatic fever   . Hyperlipidemia   . Unspecified disorders of nervous system   . Bipolar affective disorder ([place])     h/o  . H/O: CVA (cardiovascular accident)     TIA  . Fatty liver 04/09/08    found in abd CT  . Shortness of breath   . Obstructive sleep apnea     not using CPAP, last sleep study 2.5 years ago, per pt doctor is aware  . Blood transfusion 2000  . GERD (gastroesophageal reflux disease)   . Arthritis   . 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 Asthma   . Anxiety   . Depression   . Stroke [facility])     TIA- 2002  . Ulcer   . Diabetes mellitus without complication ([place])   . Anemia   . Blood transfusion without reported diagnosis   . Sleep apnea   . Platelets decreased ([place])   . 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 Back surgery      x 45  . Cholecystectomy    . Rotator cuff repair      left  . Abdominal hysterectomy      total  . Lumbar fusion  10/09  . Hemorroidectomy      and colon polyp removed  . Abdominal exploration surgery    . Posterior cervical fusion/foraminotomy    . Eye surgery      bilat  . Lumbar wound debridement  05/09/2011    Procedure: LUMBAR WOUND DEBRIDEMENT;  Surgeon: Dahlia Bailiff;  Location: [facility];  Service: Orthopedics;  Laterality: N/A;  IRRIGATION AND DEBRIDEMENT SPINAL WOUND  . Axillary artery cannulation via 8-mm hemashield graft, median sternotomy, extracorporeal circulation with deep hypothermic circulatory arrest, repair of  aortic dessection  01/23/2009    Dr Arlyce Dice  . Cataract      both eyes  . Colonoscopy    . Liver biopsy  04/10/2012    Procedure: LIVER BIOPSY;  Surgeon: Inda Castle, MD;  Location: [facility];  Service: Endoscopy;  Laterality: N/A;  ultrasound to mark order for abd  limited/liver to be marked to be sent by [email or web address]     Social History   Social History  . Marital Status: Single    Spouse Name: N/A  . Number of Children: 2  . Years of Education: N/A   Occupational History  . Disabled    Social History Main Topics  . Smoking status: Former Smoker -- 1.50 packs/day for 50 years    Types: Cigarettes    Quit date: 04/06/2013  . Smokeless tobacco: Never Used  . Alcohol Use: No  . Drug Use: No  . Sexual Activity: Not on file   Other Topics Concern  . Not on file   Social History Narrative  1 son, 28+ y/o   Daily caffeine use: 2 cups daily   Does not get regular exercise   Disability due to bipolar      Has a living will- she is a DNR (she has form at home per pt).    Family History  Problem Relation Age of Onset  . Colon cancer Neg Hx   . Esophageal cancer Neg Hx   . Rectal cancer Neg Hx   . Breast cancer Mother   . Lung cancer Mother   . Stomach cancer Father   . Diabetes      4 aunts, and 1 uncle    Allergies  Allergen Reactions  . Doxycycline Other (See Comments)    Face red and swollen; no difficulty breathing    Medication list reviewed and updated in full in [facility].  GEN: No fevers, chills. Nontoxic. Primarily MSK c/o today. MSK: Detailed in the HPI GI: tolerating PO intake without difficulty Neuro: No numbness, parasthesias, or tingling associated. Otherwise the pertinent positives of the ROS are noted above.   Objective:   BP 118/72 mmHg  Pulse 88  Temp(Src) 97.4 F (36.3 C) (Oral)  Ht 5' 3"  (1.6 m)  Wt 220 lb 8 oz (100.018 kg)  BMI 39.07 kg/m2   GEN: WDWN, NAD, Non-toxic, Alert & Oriented x 3 HEENT: Atraumatic, Normocephalic.  Ears and Nose: No external deformity. EXTR: No clubbing/cyanosis/edema NEURO: antalgic gait PSYCH: Normally interactive. Conversant. Not depressed or anxious appearing.  Calm demeanor.    Right knee lacks 3 of extension.  Flexion to 105.  She has no effusion.   Nontender along the patellar tendon.  Extension and flexion remain intact.  Tenderness on both medial and lateral joint lines.  MCL and LCL appear grossly intact. ACL and PCL intact.  The patient also is notably tender along her sartorius and along all of her hip abductors and hip flexors.  Hip flexion is painful as well as abduction with 4/5 pain on the right.  Radiology: Dg Knee Complete 4 Views Right  06/23/2015  CLINICAL DATA:  Right knee pain after fall.  Initial evaluation . EXAM: RIGHT KNEE - COMPLETE 4+ VIEW COMPARISON:  08/06/2012. FINDINGS: Mild tricompartment degenerative change. No acute bony abnormality identified. Small knee joint effusion. IMPRESSION: Mild tricompartment degenerative change. No evidence fracture or dislocation.Small knee joint effusion. Electronically Signed   By: Marcello Moores  Register   On: 06/23/2015 14:15   Ct Chest Nodule Follow Up Low Dose W/o  06/30/2015  CLINICAL DATA:  65 year old female with history of pulmonary nodule. Followup study. EXAM: CT CHEST WITHOUT CONTRAST TECHNIQUE: Multidetector CT imaging of the chest was performed following the standard protocol without IV contrast. COMPARISON:  PE protocol CT scan 02/19/2015.  Multiple other priors. FINDINGS: Mediastinum/Lymph Nodes: Heart size is normal. There is no significant pericardial fluid, thickening or pericardial calcification. There is atherosclerosis of the thoracic aorta, the great vessels of the mediastinum and the coronary arteries, including calcified atherosclerotic plaque in the left main, left anterior descending and right coronary arteries. No pathologically enlarged mediastinal or hilar lymph nodes. Please note that accurate exclusion of hilar adenopathy is limited on noncontrast CT scans. Esophagus is unremarkable in appearance. No axillary lymphadenopathy. Lungs/Pleura: Again noted is a pleural-based nodular area of ground-glass attenuation in the apex of the right upper lobe (image 19 of series 3)  measuring 7 mm, which is unchanged compared to prior examinations dating back to 06/22/2011, considered benign, likely an area of chronic scarring.  No other larger more suspicious appearing pulmonary nodules or masses are noted. No acute consolidative airspace disease. No pleural effusions. Upper Abdomen: Nodular contour of the liver, suggesting underlying cirrhosis. Diffuse low attenuation throughout the hepatic parenchyma, compatible with hepatic steatosis. Spleen is incompletely visualized, but appears enlarged measuring up to 13.7 x 6.1 cm on axial images. Musculoskeletal/Soft Tissues: Orthopedic fixation hardware in the lower cervical spine. Spinal cord stimulator in the mid thoracic region incidentally noted. There are no aggressive appearing lytic or blastic lesions noted in the visualized portions of the skeleton. IMPRESSION: 1. 7 mm ground-glass attenuation pleural-based nodule in the apex of the right upper lobe is unchanged on prior studies dating back to 06/22/2011, considered benign, likely an area of chronic post infectious or inflammatory scarring. No future imaging followup is recommended. 2. Atherosclerosis, including left main and 2 vessel coronary artery disease. Please note that although the presence of coronary artery calcium documents the presence of coronary artery disease, the severity of this disease and any potential stenosis cannot be assessed on this non-gated CT examination. Assessment for potential risk factor modification, dietary therapy or pharmacologic therapy may be warranted, if clinically indicated. 3. Hepatic steatosis with stigmata of cirrhosis and portal hypertension, including splenomegaly. Electronically Signed   By: Vinnie Langton M.D.   On: 06/30/2015 13:56    Assessment and Plan:   Right knee pain - Plan: methylPREDNISolone acetate (DEPO-MEDROL) injection 80 mg  Right knee injury, subsequent encounter  Controlled type 2 diabetes mellitus with complication,  without long-term current use of insulin (HCC)  Cirrhosis of liver without ascites, unspecified hepatic cirrhosis type ([place])  Right knee pain, status post injury with mild osteoarthritis. Likely internal derangement, more probable meniscal injury.  Complicated medical patient with cirrhosis, diabetes, and bipolar illness. We will try to manage this conservatively if at all possible. Continue with knee brace. Injection of corticosteroid and right knee.  Knee Injection, R Patient verbally consented to procedure. Risks (including potential rare risk of infection), benefits, and alternatives explained. Sterilely prepped with Chloraprep. Ethyl cholride used for anesthesia. 8 cc Lidocaine 1% mixed with 2 mL Depo-Medrol 40 mg injected using the anteromedial approach without difficulty. No complications with procedure and tolerated well. Patient had decreased pain post-injection.   Follow-up: 4 weeks  Signed,  Timika Muench T. Dameshia Seybold, MD   Patient's Medications  New Prescriptions   No medications on file  Previous Medications   ACCU-CHEK AVIVA PLUS TEST STRIP    Use as instructed to test blood sugar once daily E11.8   ARIPIPRAZOLE (ABILIFY) 10 MG TABLET       ARIPIPRAZOLE (ABILIFY) 20 MG TABLET       CLONAZEPAM (KLONOPIN) 0.5 MG TABLET    Take 1 tablet (0.5 mg total) by mouth 2 (two) times daily.   DICYCLOMINE (BENTYL) 10 MG CAPSULE    Take 1 capsule (10 mg total) by mouth 3 (three) times daily before meals.   DOCUSATE SODIUM (COLACE) 100 MG CAPSULE    TAKE 1 CAPSULE BY MOUTH 2 TIMES DAILY AS NEEDED FOR CONSTIPATION   LAMOTRIGINE (LAMICTAL) 100 MG TABLET    Take 3 tablets by mouth once daily   LANCETS MISC. (ACCU-CHEK SOFTCLIX LANCET DEV) KIT    USE  TO CHECK BLOOD SUGAR ONE TIME DAILY   METFORMIN (GLUCOPHAGE) 1000 MG TABLET    TAKE 1 TABLET (1,000 MG TOTAL) BY MOUTH 2 (TWO) TIMES DAILY WITH A MEAL.   METOCLOPRAMIDE (REGLAN) 5 MG TABLET    Take 1 tablet (  5 mg total) by mouth 3 (three) times daily.     MIRTAZAPINE (REMERON) 15 MG TABLET    TAKE 1 TABLET BY MOUTH AT BEDTIME   NYSTATIN (MYCOSTATIN) POWDER    Apply topically 2 (two) times daily.   OMEPRAZOLE (PRILOSEC) 40 MG CAPSULE    TAKE ONE CAPSULE BY MOUTH EVERY DAY   ONDANSETRON (ZOFRAN-ODT) 4 MG DISINTEGRATING TABLET    TAKE 1 TABLET (4 MG TOTAL) BY MOUTH EVERY 8 (EIGHT) HOURS AS NEEDED FOR NAUSEA OR VOMITING.   PREGABALIN (LYRICA) 50 MG CAPSULE    Take 1 capsule (50 mg total) by mouth 3 (three) times daily.   ROPINIROLE (REQUIP) 3 MG TABLET    TAKE 1 TABLET BY MOUTH AT BEDTIME   SIMVASTATIN (ZOCOR) 10 MG TABLET    TAKE 1 TABLET BY MOUTH AT BEDTIME   SYMBICORT 160-4.5 MCG/ACT INHALER    INHALE 2 PUFFS INTO THE LUNGS 2 (TWO) TIMES DAILY.   TOLTERODINE (DETROL LA) 2 MG 24 HR CAPSULE    Take 1 capsule (2 mg total) by mouth daily.   TRAMADOL (ULTRAM) 50 MG TABLET    TAKE 2 TABLETS BY MOUTH EVERY MORNING AND 2 TABLETS BY MOUTH EVERY EVENING   ZOLPIDEM (AMBIEN) 5 MG TABLET    Take 1 tablet (5 mg total) by mouth at bedtime.  Modified Medications   No medications on file  Discontinued Medications   DIAZEPAM (VALIUM) 5 MG TABLET    TAKE 1 TABLET BY MOUTH TWICE DAILY

## 2015-07-14 ENCOUNTER — Telehealth: Payer: Self-pay | Admitting: Family Medicine

## 2015-07-14 ENCOUNTER — Encounter: Payer: Self-pay | Admitting: Medical Oncology

## 2015-07-14 ENCOUNTER — Emergency Department
Admission: EM | Admit: 2015-07-14 | Discharge: 2015-07-14 | Disposition: A | Discharge status: Home / Self Care | Payer: Medicare Other | Attending: Emergency Medicine | Admitting: Emergency Medicine

## 2015-07-14 ENCOUNTER — Emergency Department: Payer: Medicare Other

## 2015-07-14 DIAGNOSIS — R739 Hyperglycemia, unspecified: Secondary | ICD-10-CM

## 2015-07-14 DIAGNOSIS — Z7984 Long term (current) use of oral hypoglycemic drugs: Secondary | ICD-10-CM | POA: Diagnosis not present

## 2015-07-14 DIAGNOSIS — E860 Dehydration: Secondary | ICD-10-CM | POA: Diagnosis not present

## 2015-07-14 DIAGNOSIS — R5383 Other fatigue: Secondary | ICD-10-CM | POA: Insufficient documentation

## 2015-07-14 DIAGNOSIS — Z79899 Other long term (current) drug therapy: Secondary | ICD-10-CM | POA: Diagnosis not present

## 2015-07-14 DIAGNOSIS — R1084 Generalized abdominal pain: Secondary | ICD-10-CM | POA: Diagnosis not present

## 2015-07-14 DIAGNOSIS — I10 Essential (primary) hypertension: Secondary | ICD-10-CM | POA: Insufficient documentation

## 2015-07-14 DIAGNOSIS — E1165 Type 2 diabetes mellitus with hyperglycemia: Secondary | ICD-10-CM | POA: Insufficient documentation

## 2015-07-14 DIAGNOSIS — Z87891 Personal history of nicotine dependence: Secondary | ICD-10-CM | POA: Diagnosis not present

## 2015-07-14 DIAGNOSIS — K7460 Unspecified cirrhosis of liver: Secondary | ICD-10-CM | POA: Diagnosis not present

## 2015-07-14 LAB — LIPASE, BLOOD: LIPASE: 21 U/L (ref 11–51)

## 2015-07-14 LAB — BASIC METABOLIC PANEL
ANION GAP: 11 (ref 5–15)
BUN: 15 mg/dL (ref 6–20)
CALCIUM: 9.6 mg/dL (ref 8.9–10.3)
CHLORIDE: 102 mmol/L (ref 101–111)
CO2: 24 mmol/L (ref 22–32)
Creatinine, Ser: 0.94 mg/dL (ref 0.44–1.00)
GFR calc Af Amer: >60 mL/min (ref >60)
GFR calc non Af Amer: >60 mL/min (ref >60)
GLUCOSE: 461 mg/dL — AB (ref 65–99)
Potassium: 4.7 mmol/L (ref 3.5–5.1)
Sodium: 137 mmol/L (ref 135–145)

## 2015-07-14 LAB — HEPATIC FUNCTION PANEL
ALBUMIN: 3.5 g/dL (ref 3.5–5.0)
ALT: 26 U/L (ref 14–54)
AST: 43 U/L — ABNORMAL HIGH (ref 15–41)
Alkaline Phosphatase: 198 U/L — ABNORMAL HIGH (ref 38–126)
BILIRUBIN INDIRECT: 0.5 mg/dL (ref 0.3–0.9)
Bilirubin, Direct: 0.2 mg/dL (ref 0.1–0.5)
TOTAL PROTEIN: 7.7 g/dL (ref 6.5–8.1)
Total Bilirubin: 0.7 mg/dL (ref 0.3–1.2)

## 2015-07-14 LAB — URINALYSIS COMPLETE WITH MICROSCOPIC (ARMC ONLY)
Bilirubin Urine: NEGATIVE
Ketones, ur: NEGATIVE mg/dL
Nitrite: NEGATIVE
PROTEIN: NEGATIVE mg/dL
Specific Gravity, Urine: 1.032 — ABNORMAL HIGH (ref 1.005–1.030)
pH: 5 (ref 5.0–8.0)

## 2015-07-14 LAB — CBC
HCT: 35.5 % (ref 35.0–47.0)
HEMOGLOBIN: 11.5 g/dL — AB (ref 12.0–16.0)
MCH: 27.6 pg (ref 26.0–34.0)
MCHC: 32.5 g/dL (ref 32.0–36.0)
MCV: 84.8 fL (ref 80.0–100.0)
Platelets: 144 10*3/uL — ABNORMAL LOW (ref 150–440)
RBC: 4.18 MIL/uL (ref 3.80–5.20)
RDW: 16.8 % — ABNORMAL HIGH (ref 11.5–14.5)
WBC: 8.6 10*3/uL (ref 3.6–11.0)

## 2015-07-14 LAB — GLUCOSE, CAPILLARY
GLUCOSE-CAPILLARY: 408 mg/dL — AB (ref 65–99)
GLUCOSE-CAPILLARY: 214 mg/dL — AB (ref 65–99)
Glucose-Capillary: 302 mg/dL — ABNORMAL HIGH (ref 65–99)

## 2015-07-14 LAB — SEDIMENTATION RATE: SED RATE: 85 mm/h — AB (ref 0–30)

## 2015-07-14 MED ORDER — IOHEXOL 300 MG/ML  SOLN
100.0000 mL | Freq: Once | INTRAMUSCULAR | Status: AC | PRN
  Administered 2015-07-14: 100 mL via INTRAVENOUS
  Filled 2015-07-14: qty 100

## 2015-07-14 MED ORDER — SODIUM CHLORIDE 0.9 % IV BOLUS (SEPSIS)
1000.0000 mL | Freq: Once | INTRAVENOUS | Status: AC
  Administered 2015-07-14: 1000 mL via INTRAVENOUS

## 2015-07-14 MED ORDER — IOHEXOL 240 MG/ML SOLN
25.0000 mL | Freq: Once | INTRAMUSCULAR | Status: AC | PRN
  Administered 2015-07-14: 25 mL via ORAL
  Filled 2015-07-14: qty 25

## 2015-07-14 MED ORDER — METOCLOPRAMIDE HCL 10 MG PO TABS: 10.0000 mg | ORAL_TABLET | Freq: Three times a day (TID) | ORAL | Status: DC

## 2015-07-14 MED ORDER — ONDANSETRON HCL 4 MG/2ML IJ SOLN
4.0000 mg | Freq: Once | INTRAMUSCULAR | Status: AC
  Administered 2015-07-14: 4 mg via INTRAVENOUS

## 2015-07-14 MED ORDER — ONDANSETRON HCL 4 MG/2ML IJ SOLN
INTRAMUSCULAR | Status: AC
  Filled 2015-07-14: qty 2

## 2015-07-14 MED ORDER — SODIUM CHLORIDE 0.9 % IV BOLUS (SEPSIS)
2000.0000 mL | Freq: Once | INTRAVENOUS | Status: AC
  Administered 2015-07-14: 2000 mL via INTRAVENOUS

## 2015-07-14 NOTE — ED Notes (Signed)
Glucose 302

## 2015-07-14 NOTE — ED Notes (Signed)
Pt reports that her blood sugars at home have been elevating since last night. Pt reports she feels sleepy.

## 2015-07-14 NOTE — ED Notes (Signed)
Glucose reading, 214

## 2015-07-14 NOTE — ED Provider Notes (Signed)
[facility] Emergency Department Provider Note  ____________________________________________  Time seen: 4:00 PM  I have reviewed the triage vital signs and the nursing notes.   HISTORY  Chief Complaint Hyperglycemia    HPI Stephanie [name] is a 65 y.o. female who complains of elevated blood sugars since last night and feeling somewhat fatigued and sleepy. She has diabetes which is controlled by taking metformin 1000 mg twice a day and followed by her primary care doctor. Her last A1c was increased to 7 from 6, and her doctor plans to check it again in March. Recently her blood sugars have been about 300 when she checks it once each day at nighttime over the past 4 weeks. She has been urinating frequently but denies any changes in her oral intake. No fevers chills. Has some nausea but no vomiting. Some generalized mild abdominal discomfort.     Past Medical History  Diagnosis Date  . Adenomatous colon polyp 08/27/2014    Polyps x 3  . Insomnia   . RLS (restless legs syndrome)   . Colon polyp   . Incontinence   . H/O: rheumatic fever   . Hyperlipidemia   . Unspecified disorders of nervous system   . Bipolar affective disorder ([place])     h/o  . H/O: CVA (cardiovascular accident)     TIA  . Fatty liver 04/09/08    found in abd CT  . Shortness of breath   . Obstructive sleep apnea     not using CPAP, last sleep study 2.5 years ago, per pt doctor is aware  . Blood transfusion 2000  . GERD (gastroesophageal reflux disease)   . Arthritis   . 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 Asthma   . Anxiety   . Depression   . Stroke [facility])     TIA- 2002  . Ulcer   . Diabetes mellitus without complication ([place])   . Anemia   . Blood transfusion without reported diagnosis   . Sleep apnea   . Platelets decreased ([place])   . 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 Neuropathy ([place]) 06/08/2015  .  Abdominal pain 05/14/2015  . Melena 05/14/2015  . Accidental fall from chair 03/30/2015  . Chest pain 02/25/2015  . Myalgia and myositis 02/11/2015  . Yeast infection 12/15/2014  . Intertrigo 12/15/2014  . Thrombocytopenia ([place]) 08/19/2014  . IGTN (ingrowing toe nail) 08/19/2014  . Abdominal pain, chronic, epigastric 06/12/2014  . Elevated blood pressure 03/27/2014  . Liver cirrhosis secondary to NASH 02/27/2014  . Abnormal chest CT 02/21/2014  . Chest wall pain 12/29/2013  . Dizziness and giddiness 10/30/2013  . DOE (dyspnea on exertion) 10/30/2013  . Nausea alone 10/30/2013  . Diabetes mellitus type 2, controlled, with complications ([place]) 34/19/6222  . DNR (do not resuscitate) 07/29/2013  . Encounter for routine gynecological examination 07/29/2013  . Dyspnea 07/10/2013  . Lung nodule 07/10/2013  . Right foot pain 05/20/2013  . Varicose veins of lower extremities with other complications 97/98/9211  . GERD (gastroesophageal reflux disease) 10/09/2012  . Cirrhosis of liver ([place]) 02/02/2012  . Anxiety 07/11/2011  . Aortic dissection ([place])   . BACK PAIN, LUMBAR, CHRONIC 11/19/2009  . UNS ADVRS EFF OTH RX MEDICINAL&BIOLOGICAL SBSTNC 12/16/2008  . UNSPECIFIED VITAMIN D DEFICIENCY 09/26/2008  . TOBACCO ABUSE 06/19/2008  . RESTLESS LEGS SYNDROME 04/27/2007  . INSOMNIA  04/27/2007  . Benign neoplasm of colon 04/04/2007  . HLD (hyperlipidemia) 03/08/2007  . OSA (obstructive sleep apnea) 03/08/2007  . Essential hypertension 03/08/2007  . Asthma 03/08/2007  . BIPOLAR AFFECTIVE DISORDER, HX OF 03/08/2007     Past Surgical History  Procedure Laterality Date  . Back surgery      x 45  . Cholecystectomy    . Rotator cuff repair      left  . Abdominal hysterectomy      total  . Lumbar fusion  10/09  . Hemorroidectomy      and colon polyp removed  . Abdominal exploration surgery    . Posterior cervical fusion/foraminotomy    . Eye surgery      bilat  . Lumbar wound  debridement  05/09/2011    Procedure: LUMBAR WOUND DEBRIDEMENT;  Surgeon: Dahlia Bailiff;  Location: [place];  Service: Orthopedics;  Laterality: N/A;  IRRIGATION AND DEBRIDEMENT SPINAL WOUND  . Axillary artery cannulation via 8-mm hemashield graft, median sternotomy, extracorporeal circulation with deep hypothermic circulatory arrest, repair of  aortic dessection  01/23/2009    Dr Arlyce Dice  . Cataract      both eyes  . Colonoscopy    . Liver biopsy  04/10/2012    Procedure: LIVER BIOPSY;  Surgeon: Inda Castle, MD;  Location: [facility];  Service: Endoscopy;  Laterality: N/A;  ultrasound to mark order for abd limited/liver to be marked to be sent by [email or web address]      Current Outpatient Rx  Name  Route  Sig  Dispense  Refill  . ACCU-CHEK AVIVA PLUS test strip      Use as instructed to test blood sugar once daily E11.8   100 each   1     Dispense as written.   . ARIPiprazole (ABILIFY) 10 MG tablet               . ARIPiprazole (ABILIFY) 20 MG tablet               . clonazePAM (KLONOPIN) 0.5 MG tablet   Oral   Take 1 tablet (0.5 mg total) by mouth 2 (two) times daily.   60 tablet   0     Not to exceed 5 additional fills before 10/31/2015 ...   . dicyclomine (BENTYL) 10 MG capsule   Oral   Take 1 capsule (10 mg total) by mouth 3 (three) times daily before meals.   90 capsule   5   . docusate sodium (COLACE) 100 MG capsule      TAKE 1 CAPSULE BY MOUTH 2 TIMES DAILY AS NEEDED FOR CONSTIPATION   60 capsule   5   . lamoTRIgine (LAMICTAL) 100 MG tablet      Take 3 tablets by mouth once daily         . Lancets Misc. (ACCU-CHEK SOFTCLIX LANCET DEV) KIT      USE  TO CHECK BLOOD SUGAR ONE TIME DAILY   1 kit   0   . metFORMIN (GLUCOPHAGE) 1000 MG tablet      TAKE 1 TABLET (1,000 MG TOTAL) BY MOUTH 2 (TWO) TIMES DAILY WITH A MEAL.   180 tablet   1   . metoCLOPramide (REGLAN) 10 MG tablet   Oral   Take 1 tablet (10 mg total) by mouth 4 (four) times daily -   before meals and at bedtime.   60 tablet   0   . mirtazapine (REMERON) 15 MG tablet  TAKE 1 TABLET BY MOUTH AT BEDTIME   34 tablet   2   . nystatin (MYCOSTATIN) powder   Topical   Apply topically 2 (two) times daily.   15 g   0   . omeprazole (PRILOSEC) 40 MG capsule      TAKE ONE CAPSULE BY MOUTH EVERY DAY Patient taking differently: TAKE two CAPSULE BY MOUTH EVERY DAY   90 capsule   3   . ondansetron (ZOFRAN-ODT) 4 MG disintegrating tablet      TAKE 1 TABLET (4 MG TOTAL) BY MOUTH EVERY 8 (EIGHT) HOURS AS NEEDED FOR NAUSEA OR VOMITING.   30 tablet   0   . pregabalin (LYRICA) 50 MG capsule   Oral   Take 1 capsule (50 mg total) by mouth 3 (three) times daily.   60 capsule   1   . rOPINIRole (REQUIP) 3 MG tablet      TAKE 1 TABLET BY MOUTH AT BEDTIME   90 tablet   0   . simvastatin (ZOCOR) 10 MG tablet      TAKE 1 TABLET BY MOUTH AT BEDTIME   90 tablet   1     NEEDS REFILLS   . SYMBICORT 160-4.5 MCG/ACT inhaler      INHALE 2 PUFFS INTO THE LUNGS 2 (TWO) TIMES DAILY.   10.2 Inhaler   0     Patient must schedule office visit before future r ...   . tolterodine (DETROL LA) 2 MG 24 hr capsule   Oral   Take 1 capsule (2 mg total) by mouth daily.   90 capsule   2   . traMADol (ULTRAM) 50 MG tablet      TAKE 2 TABLETS BY MOUTH EVERY MORNING AND 2 TABLETS BY MOUTH EVERY EVENING   120 tablet   0     Not to exceed 5 additional fills before 10/04/2015 ...   . zolpidem (AMBIEN) 5 MG tablet   Oral   Take 1 tablet (5 mg total) by mouth at bedtime.   30 tablet   0     Not to exceed 5 additional fills before 10/11/2015 ...      Allergies Doxycycline   Family History  Problem Relation Age of Onset  . Colon cancer Neg Hx   . Esophageal cancer Neg Hx   . Rectal cancer Neg Hx   . Breast cancer Mother   . Lung cancer Mother   . Stomach cancer Father   . Diabetes      4 aunts, and 1 uncle    Social History Social History  Substance Use  Topics  . Smoking status: Former Smoker -- 1.50 packs/day for 50 years    Types: Cigarettes    Quit date: 04/06/2013  . Smokeless tobacco: Never Used  . Alcohol Use: No    Review of Systems  Constitutional:   No fever or chills. No weight changes Eyes:   No blurry vision or double vision.  ENT:   No sore throat. Cardiovascular:   No chest pain. Respiratory:   No dyspnea or cough. Gastrointestinal:   Positive as above for abdominal pain, without vomiting and diarrhea.  No BRBPR or melena. Genitourinary:   Negative for dysuria, urinary retention, bloody urine, or difficulty urinating. Musculoskeletal:   Negative for back pain. No joint swelling or pain. Skin:   Negative for rash. Neurological:   Negative for headaches, focal weakness or numbness. Psychiatric:  No anxiety or depression.   Endocrine:  No hot/cold intolerance, positive fatigue  10-point ROS otherwise negative.  ____________________________________________   PHYSICAL EXAM:  VITAL SIGNS: ED Triage Vitals  Enc Vitals Group     BP 07/14/15 1212 130/69 mmHg     Pulse Rate 07/14/15 1212 95     Resp 07/14/15 1212 20     Temp 07/14/15 1212 97.3 F (36.3 C)     Temp Source 07/14/15 1212 Oral     SpO2 07/14/15 1212 95 %     Weight 07/14/15 1212 210 lb (95.255 kg)     Height 07/14/15 1212 5' 3"  (1.6 m)     Head Cir --      Peak Flow --      Pain Score --      Pain Loc --      Pain Edu? --      Excl. in [place]? --     Vital signs reviewed, nursing assessments reviewed.   Constitutional:   Alert and oriented. Well appearing and in no distress. Eyes:   No scleral icterus. No conjunctival pallor. PERRL. EOMI ENT   Head:   Normocephalic and atraumatic.   Nose:   No congestion/rhinnorhea. No septal hematoma   Mouth/Throat:   Dry mucous membranes, no pharyngeal erythema. No peritonsillar mass. No uvula shift.   Neck:   No stridor. No SubQ emphysema. No meningismus. Hematological/Lymphatic/Immunilogical:    No cervical lymphadenopathy. Cardiovascular:   RRR. Normal and symmetric distal pulses are present in all extremities. No murmurs, rubs, or gallops. Respiratory:   Normal respiratory effort without tachypnea nor retractions. Breath sounds are clear and equal bilaterally. No wheezes/rales/rhonchi. Gastrointestinal:   Soft with mild generalized tenderness. No distention. There is no CVA tenderness.  No rebound, rigidity, or guarding. Genitourinary:   deferred Musculoskeletal:   Nontender with normal range of motion in all extremities. No joint effusions.  No lower extremity tenderness.  No edema. Diffuse lumbar tenderness in the area of prior surgical scars. Neurologic:   Normal speech and language.  CN 2-10 normal. Motor grossly intact. No pronator drift.  Normal gait. No gross focal neurologic deficits are appreciated.  Skin:    Skin is warm, dry and intact. No rash noted.  No petechiae, purpura, or bullae. Psychiatric:   Mood and affect are normal. Speech and behavior are normal. Patient exhibits appropriate insight and judgment.  ____________________________________________    LABS (pertinent positives/negatives) (all labs ordered are listed, but only abnormal results are displayed) Labs Reviewed  BASIC METABOLIC PANEL - Abnormal; Notable for the following:    Glucose, Bld 461 (*)    All other components within normal limits  CBC - Abnormal; Notable for the following:    Hemoglobin 11.5 (*)    RDW 16.8 (*)    Platelets 144 (*)    All other components within normal limits  URINALYSIS COMPLETEWITH MICROSCOPIC ([facility] ONLY) - Abnormal; Notable for the following:    Color, Urine YELLOW (*)    APPearance CLEAR (*)    Glucose, UA >500 (*)    Specific Gravity, Urine 1.032 (*)    Hgb urine dipstick 1+ (*)    Leukocytes, UA 1+ (*)    Bacteria, UA RARE (*)    Squamous Epithelial / LPF 0-5 (*)    All other components within normal limits  GLUCOSE, CAPILLARY - Abnormal; Notable for the  following:    Glucose-Capillary 408 (*)    All other components within normal limits  GLUCOSE, CAPILLARY - Abnormal; Notable for the following:  Glucose-Capillary 302 (*)    All other components within normal limits  SEDIMENTATION RATE - Abnormal; Notable for the following:    Sed Rate 85 (*)    All other components within normal limits  HEPATIC FUNCTION PANEL - Abnormal; Notable for the following:    AST 43 (*)    Alkaline Phosphatase 198 (*)    All other components within normal limits  GLUCOSE, CAPILLARY - Abnormal; Notable for the following:    Glucose-Capillary 214 (*)    All other components within normal limits  LIPASE, BLOOD  CBG MONITORING, ED   ____________________________________________   EKG  Interpreted by me Normal sinus rhythm rate of 93, normal axis intervals. Poor R-wave progression in anterior precordial leads. Normal ST segments and T waves.  ____________________________________________    RADIOLOGY  CT abdomen and pelvis unremarkable  ____________________________________________   PROCEDURES   ____________________________________________   INITIAL IMPRESSION / ASSESSMENT AND PLAN / ED COURSE  Pertinent labs & imaging results that were available during my care of the patient were reviewed by me and considered in my medical decision making (see chart for details).  Patient presents with hyperglycemia and fatigue. No worsening of her chronic back pain related to prior surgeries. No fever. Low suspicion for discitis or osteomyelitis of the spine or epidural abscess. Concern for an occult abdominal process for which we'll do a CT scan. We'll rehydrate with IV fluids given likely diuresis that she has experienced from the hyperglycemia.  ----------------------------------------- 6:29 PM on 07/14/2015 -----------------------------------------  CT negative. Labs negative except for elevated ESR which is nonspecific. Patient feels much better after  IV fluids. We'll discharge home, follow up closely with primary care regarding her diabetes regimen. She will withhold metformin for the next 48 hours due to the CT scan.     ____________________________________________   FINAL CLINICAL IMPRESSION(S) / ED DIAGNOSES  Final diagnoses:  Hyperglycemia  Generalized abdominal pain  Dehydration      Carrie Mew, MD 07/14/15 [phone]

## 2015-07-14 NOTE — Discharge Instructions (Signed)
Abdominal Pain, Adult Many things can cause abdominal pain. Usually, abdominal pain is not caused by a disease and will improve without treatment. It can often be observed and treated at home. Your health care provider will do a physical exam and possibly order blood tests and X-rays to help determine the seriousness of your pain. However, in many cases, more time must pass before a clear cause of the pain can be found. Before that point, your health care provider may not know if you need more testing or further treatment. HOME CARE INSTRUCTIONS Monitor your abdominal pain for any changes. The following actions may help to alleviate any discomfort you are experiencing:  Only take over-the-counter or prescription medicines as directed by your health care provider.  Do not take laxatives unless directed to do so by your health care provider.  Try a clear liquid diet (broth, tea, or water) as directed by your health care provider. Slowly move to a bland diet as tolerated. SEEK MEDICAL CARE IF:  You have unexplained abdominal pain.  You have abdominal pain associated with nausea or diarrhea.  You have pain when you urinate or have a bowel movement.  You experience abdominal pain that wakes you in the night.  You have abdominal pain that is worsened or improved by eating food.  You have abdominal pain that is worsened with eating fatty foods.  You have a fever. SEEK IMMEDIATE MEDICAL CARE IF:  Your pain does not go away within 2 hours.  You keep throwing up (vomiting).  Your pain is felt only in portions of the abdomen, such as the right side or the left lower portion of the abdomen.  You pass bloody or black tarry stools. MAKE SURE YOU:  Understand these instructions.  Will watch your condition.  Will get help right away if you are not doing well or get worse.   This information is not intended to replace advice given to you by your health care provider. Make sure you discuss  any questions you have with your health care provider.   Document Released: 03/02/2005 Document Revised: 02/11/2015 Document Reviewed: 01/30/2013 Elsevier Interactive Patient Education 2016 Elsevier Inc.  Dehydration, Adult Dehydration is a condition in which you do not have enough fluid or water in your body. It happens when you take in less fluid than you lose. Vital organs such as the kidneys, brain, and heart cannot function without a proper amount of fluids. Any loss of fluids from the body can cause dehydration.  Dehydration can range from mild to severe. This condition should be treated right away to help prevent it from becoming severe. CAUSES  This condition may be caused by:  Vomiting.  Diarrhea.  Excessive sweating, such as when exercising in hot or humid weather.  Not drinking enough fluid during strenuous exercise or during an illness.  Excessive urine output.  Fever.  Certain medicines. RISK FACTORS This condition is more likely to develop in:  People who are taking certain medicines that cause the body to lose excess fluid (diuretics).   People who have a chronic illness, such as diabetes, that may increase urination.  Older adults.   People who live at high altitudes.   People who participate in endurance sports.  SYMPTOMS  Mild Dehydration  Thirst.  Dry lips.  Slightly dry mouth.  Dry, warm skin. Moderate Dehydration  Very dry mouth.   Muscle cramps.   Dark urine and decreased urine production.   Decreased tear production.   Headache.  Light-headedness, especially when you stand up from a sitting position.  Severe Dehydration  Changes in skin.   Cold and clammy skin.   Skin does not spring back quickly when lightly pinched and released.   Changes in body fluids.   Extreme thirst.   No tears.   Not able to sweat when body temperature is high, such as in hot weather.   Minimal urine production.   Changes  in vital signs.   Rapid, weak pulse (more than 100 beats per minute when you are sitting still).   Rapid breathing.   Low blood pressure.   Other changes.   Sunken eyes.   Cold hands and feet.   Confusion.  Lethargy and difficulty being awakened.  Fainting (syncope).   Short-term weight loss.   Unconsciousness. DIAGNOSIS  This condition may be diagnosed based on your symptoms. You may also have tests to determine how severe your dehydration is. These tests may include:   Urine tests.   Blood tests.  TREATMENT  Treatment for this condition depends on the severity. Mild or moderate dehydration can often be treated at home. Treatment should be started right away. Do not wait until dehydration becomes severe. Severe dehydration needs to be treated at the hospital. Treatment for Mild Dehydration  Drinking plenty of water to replace the fluid you have lost.   Replacing minerals in your blood (electrolytes) that you may have lost.  Treatment for Moderate Dehydration  Consuming oral rehydration solution (ORS). Treatment for Severe Dehydration  Receiving fluid through an IV tube.   Receiving electrolyte solution through a feeding tube that is passed through your nose and into your stomach (nasogastric tube or NG tube).  Correcting any abnormalities in electrolytes. HOME CARE INSTRUCTIONS   Drink enough fluid to keep your urine clear or pale yellow.   Drink water or fluid slowly by taking small sips. You can also try sucking on ice cubes.  Have food or beverages that contain electrolytes. Examples include bananas and sports drinks.  Take over-the-counter and prescription medicines only as told by your health care provider.   Prepare ORS according to the manufacturer's instructions. Take sips of ORS every 5 minutes until your urine returns to normal.  If you have vomiting or diarrhea, continue to try to drink water, ORS, or both.   If you have  diarrhea, avoid:   Beverages that contain caffeine.   Fruit juice.   Milk.   Carbonated soft drinks.  Do not take salt tablets. This can lead to the condition of having too much sodium in your body (hypernatremia).  SEEK MEDICAL CARE IF:  You cannot eat or drink without vomiting.  You have had moderate diarrhea during a period of more than 24 hours.  You have a fever. SEEK IMMEDIATE MEDICAL CARE IF:   You have extreme thirst.  You have severe diarrhea.  You have not urinated in 6-8 hours, or you have urinated only a small amount of very dark urine.  You have shriveled skin.  You are dizzy, confused, or both.   This information is not intended to replace advice given to you by your health care provider. Make sure you discuss any questions you have with your health care provider.   Document Released: 05/23/2005 Document Revised: 02/11/2015 Document Reviewed: 10/08/2014 Elsevier Interactive Patient Education 2016 [employer].  Hyperglycemia Hyperglycemia occurs when the glucose (sugar) in your blood is too high. Hyperglycemia can happen for many reasons, but it most often happens to people  who do not know they have diabetes or are not managing their diabetes properly.  CAUSES  Whether you have diabetes or not, there are other causes of hyperglycemia. Hyperglycemia can occur when you have diabetes, but it can also occur in other situations that you might not be as aware of, such as: Diabetes  If you have diabetes and are having problems controlling your blood glucose, hyperglycemia could occur because of some of the following reasons:  Not following your meal plan.  Not taking your diabetes medications or not taking it properly.  Exercising less or doing less activity than you normally do.  Being sick. Pre-diabetes  This cannot be ignored. Before people develop Type 2 diabetes, they almost always have "pre-diabetes." This is when your blood glucose levels are  higher than normal, but not yet high enough to be diagnosed as diabetes. Research has shown that some long-term damage to the body, especially the heart and circulatory system, may already be occurring during pre-diabetes. If you take action to manage your blood glucose when you have pre-diabetes, you may delay or prevent Type 2 diabetes from developing. Stress  If you have diabetes, you may be "diet" controlled or on oral medications or insulin to control your diabetes. However, you may find that your blood glucose is higher than usual in the hospital whether you have diabetes or not. This is often referred to as "stress hyperglycemia." Stress can elevate your blood glucose. This happens because of hormones put out by the body during times of stress. If stress has been the cause of your high blood glucose, it can be followed regularly by your caregiver. That way he/she can make sure your hyperglycemia does not continue to get worse or progress to diabetes. Steroids  Steroids are medications that act on the infection fighting system (immune system) to block inflammation or infection. One side effect can be a rise in blood glucose. Most people can produce enough extra insulin to allow for this rise, but for those who cannot, steroids make blood glucose levels go even higher. It is not unusual for steroid treatments to "uncover" diabetes that is developing. It is not always possible to determine if the hyperglycemia will go away after the steroids are stopped. A special blood test called an A1c is sometimes done to determine if your blood glucose was elevated before the steroids were started. SYMPTOMS  Thirsty.  Frequent urination.  Dry mouth.  Blurred vision.  Tired or fatigue.  Weakness.  Sleepy.  Tingling in feet or leg. DIAGNOSIS  Diagnosis is made by monitoring blood glucose in one or all of the following ways:  A1c test. This is a chemical found in your blood.  Fingerstick blood  glucose monitoring.  Laboratory results. TREATMENT  First, knowing the cause of the hyperglycemia is important before the hyperglycemia can be treated. Treatment may include, but is not be limited to:  Education.  Change or adjustment in medications.  Change or adjustment in meal plan.  Treatment for an illness, infection, etc.  More frequent blood glucose monitoring.  Change in exercise plan.  Decreasing or stopping steroids.  Lifestyle changes. HOME CARE INSTRUCTIONS   Test your blood glucose as directed.  Exercise regularly. Your caregiver will give you instructions about exercise. Pre-diabetes or diabetes which comes on with stress is helped by exercising.  Eat wholesome, balanced meals. Eat often and at regular, fixed times. Your caregiver or nutritionist will give you a meal plan to guide your sugar intake.  Being at an ideal weight is important. If needed, losing as little as 10 to 15 pounds may help improve blood glucose levels. SEEK MEDICAL CARE IF:   You have questions about medicine, activity, or diet.  You continue to have symptoms (problems such as increased thirst, urination, or weight gain). SEEK IMMEDIATE MEDICAL CARE IF:   You are vomiting or have diarrhea.  Your breath smells fruity.  You are breathing faster or slower.  You are very sleepy or incoherent.  You have numbness, tingling, or pain in your feet or hands.  You have chest pain.  Your symptoms get worse even though you have been following your caregiver's orders.  If you have any other questions or concerns.   This information is not intended to replace advice given to you by your health care provider. Make sure you discuss any questions you have with your health care provider.   Document Released: 11/16/2000 Document Revised: 08/15/2011 Document Reviewed: 01/27/2015 Elsevier Interactive Patient Education [employer].

## 2015-07-14 NOTE — Telephone Encounter (Signed)
I called pt and when ambulance came her BS had gone done into the 400's and pt did not want to go to ED; pt took metformin 1000 mg at 10 Am and now BS 458; pt is still nauseated, sleepy and dizzy. Advised pt she needs to go to ED since symptoms have not improved even thou the BS has come down. Pt wants to ask Dr Deborra Medina before she does that. Dr Deborra Medina advised she needs to go to ED now. Pt's neighbor Parke Simmers is taking pt to [facility] ED now.

## 2015-07-14 NOTE — Telephone Encounter (Signed)
I spoke with pt and she changed ins and got med from old mail order; the mail order pharmacy is sending pt an envelope to return med. Nothing further needed.

## 2015-07-14 NOTE — Telephone Encounter (Signed)
Patient Name: Stephanie [name]  DOB: 12/27/1950    Initial Comment caller states her BS is 507   Nurse Assessment  Nurse: Julien Girt, RN, Almyra Free Date/Time Eilene Ghazi Time): 07/14/2015 9:24:36 AM  Confirm and document reason for call. If symptomatic, describe symptoms. You must click the next button to save text entered. ---Caller states her BS is 507 now. States it has been above 400 all night. She has been awake during the night because she feels bad, is nauseated. She was in the clinic on Monday and got a cortisone shot for her knee.  Has the patient traveled out of the country within the last 30 days? ---Not Applicable  Does the patient have any new or worsening symptoms? ---Yes  Will a triage be completed? ---Yes  Related visit to physician within the last 2 weeks? ---No  Does the PT have any chronic conditions? (i.e. diabetes, asthma, etc.) ---Yes  List chronic conditions. ---Type 2 diabetes, Hx knee injury, Bipolar, High cholesterol  Is this a behavioral health or substance abuse call? ---No     Guidelines    Guideline Title Affirmed Question Affirmed Notes  Diabetes - High Blood Sugar Sounds like a life-threatening emergency to the triager    Final Disposition User   Call EMS 911 Now Julien Girt, RN, Almyra Free    Comments  Caller states she is very dizzy and is alone. She is calling her neighbor and 911 now.   Disagree/Comply: Comply

## 2015-07-15 ENCOUNTER — Ambulatory Visit: Payer: Commercial Managed Care - HMO | Admitting: Family Medicine

## 2015-07-15 ENCOUNTER — Emergency Department
Admission: EM | Admit: 2015-07-15 | Discharge: 2015-07-15 | Disposition: A | Discharge status: Home / Self Care | Payer: Medicare Other | Attending: Emergency Medicine | Admitting: Emergency Medicine

## 2015-07-15 ENCOUNTER — Ambulatory Visit: Payer: Medicare Other | Admitting: Family Medicine

## 2015-07-15 ENCOUNTER — Telehealth: Payer: Self-pay

## 2015-07-15 DIAGNOSIS — E1165 Type 2 diabetes mellitus with hyperglycemia: Secondary | ICD-10-CM | POA: Diagnosis not present

## 2015-07-15 DIAGNOSIS — Z79899 Other long term (current) drug therapy: Secondary | ICD-10-CM | POA: Insufficient documentation

## 2015-07-15 DIAGNOSIS — Z7984 Long term (current) use of oral hypoglycemic drugs: Secondary | ICD-10-CM | POA: Diagnosis not present

## 2015-07-15 DIAGNOSIS — I10 Essential (primary) hypertension: Secondary | ICD-10-CM | POA: Diagnosis not present

## 2015-07-15 DIAGNOSIS — R7309 Other abnormal glucose: Secondary | ICD-10-CM | POA: Diagnosis not present

## 2015-07-15 DIAGNOSIS — R739 Hyperglycemia, unspecified: Secondary | ICD-10-CM | POA: Diagnosis not present

## 2015-07-15 DIAGNOSIS — Z87891 Personal history of nicotine dependence: Secondary | ICD-10-CM | POA: Diagnosis not present

## 2015-07-15 DIAGNOSIS — F312 Bipolar disorder, current episode manic severe with psychotic features: Secondary | ICD-10-CM | POA: Diagnosis not present

## 2015-07-15 LAB — BASIC METABOLIC PANEL
Anion gap: 7 (ref 5–15)
BUN: 13 mg/dL (ref 6–20)
CHLORIDE: 104 mmol/L (ref 101–111)
CO2: 24 mmol/L (ref 22–32)
CREATININE: 0.74 mg/dL (ref 0.44–1.00)
Calcium: 8.4 mg/dL — ABNORMAL LOW (ref 8.9–10.3)
GFR calc non Af Amer: >60 mL/min (ref >60)
Glucose, Bld: 378 mg/dL — ABNORMAL HIGH (ref 65–99)
POTASSIUM: 3.8 mmol/L (ref 3.5–5.1)
SODIUM: 135 mmol/L (ref 135–145)

## 2015-07-15 LAB — CBC
HEMATOCRIT: 32.4 % — AB (ref 35.0–47.0)
Hemoglobin: 10.6 g/dL — ABNORMAL LOW (ref 12.0–16.0)
MCH: 27.8 pg (ref 26.0–34.0)
MCHC: 32.8 g/dL (ref 32.0–36.0)
MCV: 84.5 fL (ref 80.0–100.0)
Platelets: 124 10*3/uL — ABNORMAL LOW (ref 150–440)
RBC: 3.83 MIL/uL (ref 3.80–5.20)
RDW: 16.7 % — ABNORMAL HIGH (ref 11.5–14.5)
WBC: 5.9 10*3/uL (ref 3.6–11.0)

## 2015-07-15 LAB — GLUCOSE, CAPILLARY
Glucose-Capillary: 288 mg/dL — ABNORMAL HIGH (ref 65–99)
Glucose-Capillary: 300 mg/dL — ABNORMAL HIGH (ref 65–99)

## 2015-07-15 MED ORDER — ONDANSETRON HCL 4 MG/2ML IJ SOLN
INTRAMUSCULAR | Status: AC
  Administered 2015-07-15: 4 mg via INTRAVENOUS
  Filled 2015-07-15: qty 2

## 2015-07-15 MED ORDER — GLIPIZIDE 5 MG PO TABS: 5.0000 mg | ORAL_TABLET | Freq: Every day | ORAL | Status: DC

## 2015-07-15 MED ORDER — ONDANSETRON HCL 4 MG/2ML IJ SOLN
4.0000 mg | Freq: Once | INTRAMUSCULAR | Status: AC
  Administered 2015-07-15: 4 mg via INTRAVENOUS

## 2015-07-15 MED ORDER — GLIPIZIDE 10 MG PO TABS
5.0000 mg | ORAL_TABLET | Freq: Every day | ORAL | Status: DC
  Administered 2015-07-15: 5 mg via ORAL
  Filled 2015-07-15: qty 1

## 2015-07-15 NOTE — ED Notes (Signed)
Pt states that her blood sugar was 402 at home.  Pt states that she has been nauseated.  Pt has been off metformin due to having a CT scan a couple of day ago.  Per pt her blood sugar was high before CT scan.

## 2015-07-15 NOTE — Telephone Encounter (Signed)
PLEASE NOTE: All timestamps contained within this report are represented as [place] Standard Time. CONFIDENTIALTY NOTICE: This fax transmission is intended only for the addressee. It contains information that is legally privileged, confidential or otherwise protected from use or disclosure. If you are not the intended recipient, you are strictly prohibited from reviewing, disclosing, copying using or disseminating any of this information or taking any action in reliance on or regarding this information. If you have received this fax in error, please notify us immediately by telephone so that we can arrange for its return to [name]. Phone: [phone], Toll-Free: [phone], Fax: [phone] Page: 1 of 2 Call Id: [id number] Dunn Patient Name: Stephanie [name] Gender: Female DOB: 1951/03/16 Age: 65 Y 10 M 17 D Return Phone Number: [phone] (Primary) Address: City/State/Zip: [place] Client [place] Night - Client Client Site [place] Physician [name] Contact Type Call Call Type Triage / Clinical Relationship To Patient Self Return Phone Number [phone] (Primary) Chief Complaint Blood Sugar High Initial Comment Caller states her blood sugar is 446. PreDisposition Did not know what to do Translation No Nurse Assessment Nurse: Barbera Setters, RN, Clarene Critchley Date/Time (Eastern Time): 07/15/2015 4:12:36 AM Confirm and document reason for call. If symptomatic, describe symptoms. You must click the next button to save text entered. ---Caller states her blood sugar is 446. current blood sugar is 381 was in ER last night for Blood Sugar greater than 500 Has the patient traveled out of the country within the last 30 days? ---No Does the patient have any new or worsening symptoms? ---Yes Will a triage be completed? ---Yes Related visit to  physician within the last 2 weeks? ---Yes Does the PT have any chronic conditions? (i.e. diabetes, asthma, etc.) ---Yes List chronic conditions. ---diabetic bipolar anxiety sleep apnea Is this a behavioral health or substance abuse call? ---No Guidelines Guideline Title Affirmed Question Affirmed Notes Nurse Date/Time (Eastern Time) Diabetes - High Blood Sugar Acting confused (e.g., disoriented, slurred speech) Pringle, RN, Clarene Critchley 07/15/2015 4:17:38 AM Disp. Time Eilene Ghazi Time) Disposition Final User 07/15/2015 4:19:18 AM Call EMS 911 Now Pringle, RN, Clarene Critchley 07/15/2015 4:19:46 AM Send To RN Personal Pringle, RN, Clarene Critchley 07/15/2015 4:27:30 AM 911 Outcome Documentation Pringle, RN, Clarene Critchley Reason: EMS is there with pt now 07/15/2015 4:27:48 AM Call Completed Pringle, RN, Clarene Critchley 07/15/2015 4:19:22 AM Call EMS 911 Now Yes Pringle, RN, Clarene Critchley PLEASE NOTE: All timestamps contained within this report are represented as [place] Standard Time. CONFIDENTIALTY NOTICE: This fax transmission is intended only for the addressee. It contains information that is legally privileged, confidential or otherwise protected from use or disclosure. If you are not the intended recipient, you are strictly prohibited from reviewing, disclosing, copying using or disseminating any of this information or taking any action in reliance on or regarding this information. If you have received this fax in error, please notify us immediately by telephone so that we can arrange for its return to [name]. Phone: [phone], Toll-Free: [phone], Fax: [phone] Page: 2 of 2 Call Id: [id number] Caller Understands: Yes Disagree/Comply: Comply Care Advice Given Per Guideline CALL EMS 911 NOW: Immediate medical attention is needed. You need to hang up and call 911 (or an ambulance). Psychologist, forensic Discretion: I'll call you back in a few minutes to be sure you were able to reach them.) CARE ADVICE given per Diabetes - High Blood Sugar  (Adult) guideline.

## 2015-07-15 NOTE — Discharge Instructions (Signed)
Hyperglycemia °Hyperglycemia occurs when the glucose (sugar) in your blood is too high. Hyperglycemia can happen for many reasons, but it most often happens to people who do not know they have diabetes or are not managing their diabetes properly.  °CAUSES  °Whether you have diabetes or not, there are other causes of hyperglycemia. Hyperglycemia can occur when you have diabetes, but it can also occur in other situations that you might not be as aware of, such as: °Diabetes °· If you have diabetes and are having problems controlling your blood glucose, hyperglycemia could occur because of some of the following reasons: °¨ Not following your meal plan. °¨ Not taking your diabetes medications or not taking it properly. °¨ Exercising less or doing less activity than you normally do. °¨ Being sick. °Pre-diabetes °· This cannot be ignored. Before people develop Type 2 diabetes, they almost always have "pre-diabetes." This is when your blood glucose levels are higher than normal, but not yet high enough to be diagnosed as diabetes. Research has shown that some long-term damage to the body, especially the heart and circulatory system, may already be occurring during pre-diabetes. If you take action to manage your blood glucose when you have pre-diabetes, you may delay or prevent Type 2 diabetes from developing. °Stress °· If you have diabetes, you may be "diet" controlled or on oral medications or insulin to control your diabetes. However, you may find that your blood glucose is higher than usual in the hospital whether you have diabetes or not. This is often referred to as "stress hyperglycemia." Stress can elevate your blood glucose. This happens because of hormones put out by the body during times of stress. If stress has been the cause of your high blood glucose, it can be followed regularly by your caregiver. That way he/she can make sure your hyperglycemia does not continue to get worse or progress to  diabetes. °Steroids °· Steroids are medications that act on the infection fighting system (immune system) to block inflammation or infection. One side effect can be a rise in blood glucose. Most people can produce enough extra insulin to allow for this rise, but for those who cannot, steroids make blood glucose levels go even higher. It is not unusual for steroid treatments to "uncover" diabetes that is developing. It is not always possible to determine if the hyperglycemia will go away after the steroids are stopped. A special blood test called an A1c is sometimes done to determine if your blood glucose was elevated before the steroids were started. °SYMPTOMS °· Thirsty. °· Frequent urination. °· Dry mouth. °· Blurred vision. °· Tired or fatigue. °· Weakness. °· Sleepy. °· Tingling in feet or leg. °DIAGNOSIS  °Diagnosis is made by monitoring blood glucose in one or all of the following ways: °· A1c test. This is a chemical found in your blood. °· Fingerstick blood glucose monitoring. °· Laboratory results. °TREATMENT  °First, knowing the cause of the hyperglycemia is important before the hyperglycemia can be treated. Treatment may include, but is not be limited to: °· Education. °· Change or adjustment in medications. °· Change or adjustment in meal plan. °· Treatment for an illness, infection, etc. °· More frequent blood glucose monitoring. °· Change in exercise plan. °· Decreasing or stopping steroids. °· Lifestyle changes. °HOME CARE INSTRUCTIONS  °· Test your blood glucose as directed. °· Exercise regularly. Your caregiver will give you instructions about exercise. Pre-diabetes or diabetes which comes on with stress is helped by exercising. °· Eat wholesome,   balanced meals. Eat often and at regular, fixed times. Your caregiver or nutritionist will give you a meal plan to guide your sugar intake. °· Being at an ideal weight is important. If needed, losing as little as 10 to 15 pounds may help improve blood  glucose levels. °SEEK MEDICAL CARE IF:  °· You have questions about medicine, activity, or diet. °· You continue to have symptoms (problems such as increased thirst, urination, or weight gain). °SEEK IMMEDIATE MEDICAL CARE IF:  °· You are vomiting or have diarrhea. °· Your breath smells fruity. °· You are breathing faster or slower. °· You are very sleepy or incoherent. °· You have numbness, tingling, or pain in your feet or hands. °· You have chest pain. °· Your symptoms get worse even though you have been following your caregiver's orders. °· If you have any other questions or concerns. °  °This information is not intended to replace advice given to you by your health care provider. Make sure you discuss any questions you have with your health care provider. °  °Document Released: 11/16/2000 Document Revised: 08/15/2011 Document Reviewed: 01/27/2015 °Elsevier Interactive Patient Education ©2016 Elsevier Inc. ° °

## 2015-07-15 NOTE — ED Provider Notes (Signed)
[name] Emergency Department Provider Note  ____________________________________________  Time seen: 6:05 AM  I have reviewed the triage vital signs and the nursing notes.   HISTORY  Chief Complaint Hyperglycemia      HPI EMMALIA [name] is a 65 y.o. female presents with hyperglycemia noted this morning on awakening. Patient states glucose at home was 402. Of note patient states that she's had elevated glucose for the past 6 months while being compliant on her diabetic management of metformin thousand with grams twice a day. Patient was seen in the emergency department yesterday for abdominal pain for which a CT scan was performed. Patient was informed to hold metformin dose today given contrast administration.    Past Medical History  Diagnosis Date  . Adenomatous colon polyp 08/27/2014    Polyps x 3  . Insomnia   . RLS (restless legs syndrome)   . Colon polyp   . Incontinence   . H/O: rheumatic fever   . Hyperlipidemia   . Unspecified disorders of nervous system   . Bipolar affective disorder ([place])     h/o  . H/O: CVA (cardiovascular accident)     TIA  . Fatty liver 04/09/08    found in abd CT  . Shortness of breath   . Obstructive sleep apnea     not using CPAP, last sleep study 2.5 years ago, per pt doctor is aware  . Blood transfusion 2000  . GERD (gastroesophageal reflux disease)   . Arthritis   . 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 Asthma   . Anxiety   . Depression   . Stroke [facility])     TIA- 2002  . Ulcer   . Diabetes mellitus without complication ([place])   . Anemia   . Blood transfusion without reported diagnosis   . Sleep apnea   . Platelets decreased ([place])   . 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 Neuropathy ([place]) 06/08/2015  . Abdominal pain 05/14/2015  . Melena 05/14/2015  . Accidental fall from chair 03/30/2015  . Chest pain 02/25/2015  . Myalgia and  myositis 02/11/2015  . Yeast infection 12/15/2014  . Intertrigo 12/15/2014  . Thrombocytopenia ([place]) 08/19/2014  . IGTN (ingrowing toe nail) 08/19/2014  . Abdominal pain, chronic, epigastric 06/12/2014  . Elevated blood pressure 03/27/2014  . Liver cirrhosis secondary to NASH 02/27/2014  . Abnormal chest CT 02/21/2014  . Chest wall pain 12/29/2013  . Dizziness and giddiness 10/30/2013  . DOE (dyspnea on exertion) 10/30/2013  . Nausea alone 10/30/2013  . Diabetes mellitus type 2, controlled, with complications ([place]) 40/98/1191  . DNR (do not resuscitate) 07/29/2013  . Encounter for routine gynecological examination 07/29/2013  . Dyspnea 07/10/2013  . Lung nodule 07/10/2013  . Right foot pain 05/20/2013  . Varicose veins of lower extremities with other complications 47/82/9562  . GERD (gastroesophageal reflux disease) 10/09/2012  . Cirrhosis of liver ([place]) 02/02/2012  . Anxiety 07/11/2011  . Aortic dissection ([place])   . BACK PAIN, LUMBAR, CHRONIC 11/19/2009  . UNS ADVRS EFF OTH RX MEDICINAL&BIOLOGICAL SBSTNC 12/16/2008  . UNSPECIFIED VITAMIN D DEFICIENCY 09/26/2008  . TOBACCO ABUSE 06/19/2008  . RESTLESS LEGS SYNDROME 04/27/2007  . INSOMNIA 04/27/2007  . Benign neoplasm of colon 04/04/2007  . HLD (hyperlipidemia) 03/08/2007  . OSA (obstructive sleep apnea) 03/08/2007  . Essential hypertension 03/08/2007  . Asthma 03/08/2007  .  BIPOLAR AFFECTIVE DISORDER, HX OF 03/08/2007    Past Surgical History  Procedure Laterality Date  . Back surgery      x 45  . Cholecystectomy    . Rotator cuff repair      left  . Abdominal hysterectomy      total  . Lumbar fusion  10/09  . Hemorroidectomy      and colon polyp removed  . Abdominal exploration surgery    . Posterior cervical fusion/foraminotomy    . Eye surgery      bilat  . Lumbar wound debridement  05/09/2011    Procedure: LUMBAR WOUND DEBRIDEMENT;  Surgeon: Dahlia Bailiff;  Location: [facility];  Service: Orthopedics;   Laterality: N/A;  IRRIGATION AND DEBRIDEMENT SPINAL WOUND  . Axillary artery cannulation via 8-mm hemashield graft, median sternotomy, extracorporeal circulation with deep hypothermic circulatory arrest, repair of  aortic dessection  01/23/2009    Dr Arlyce Dice  . Cataract      both eyes  . Colonoscopy    . Liver biopsy  04/10/2012    Procedure: LIVER BIOPSY;  Surgeon: Inda Castle, MD;  Location: [facility];  Service: Endoscopy;  Laterality: N/A;  ultrasound to mark order for abd limited/liver to be marked to be sent by [email or web address]     Current Outpatient Rx  Name  Route  Sig  Dispense  Refill  . ACCU-CHEK AVIVA PLUS test strip      Use as instructed to test blood sugar once daily E11.8   100 each   1     Dispense as written.   . ARIPiprazole (ABILIFY) 10 MG tablet               . ARIPiprazole (ABILIFY) 20 MG tablet               . clonazePAM (KLONOPIN) 0.5 MG tablet   Oral   Take 1 tablet (0.5 mg total) by mouth 2 (two) times daily.   60 tablet   0     Not to exceed 5 additional fills before 10/31/2015 ...   . dicyclomine (BENTYL) 10 MG capsule   Oral   Take 1 capsule (10 mg total) by mouth 3 (three) times daily before meals.   90 capsule   5   . docusate sodium (COLACE) 100 MG capsule      TAKE 1 CAPSULE BY MOUTH 2 TIMES DAILY AS NEEDED FOR CONSTIPATION   60 capsule   5   . lamoTRIgine (LAMICTAL) 100 MG tablet      Take 3 tablets by mouth once daily         . Lancets Misc. (ACCU-CHEK SOFTCLIX LANCET DEV) KIT      USE  TO CHECK BLOOD SUGAR ONE TIME DAILY   1 kit   0   . metFORMIN (GLUCOPHAGE) 1000 MG tablet      TAKE 1 TABLET (1,000 MG TOTAL) BY MOUTH 2 (TWO) TIMES DAILY WITH A MEAL.   180 tablet   1   . metoCLOPramide (REGLAN) 10 MG tablet   Oral   Take 1 tablet (10 mg total) by mouth 4 (four) times daily -  before meals and at bedtime.   60 tablet   0   . mirtazapine (REMERON) 15 MG tablet      TAKE 1 TABLET BY MOUTH AT BEDTIME   34  tablet   2   . nystatin (MYCOSTATIN) powder   Topical   Apply topically 2 (two) times daily.  15 g   0   . omeprazole (PRILOSEC) 40 MG capsule      TAKE ONE CAPSULE BY MOUTH EVERY DAY Patient taking differently: TAKE two CAPSULE BY MOUTH EVERY DAY   90 capsule   3   . ondansetron (ZOFRAN-ODT) 4 MG disintegrating tablet      TAKE 1 TABLET (4 MG TOTAL) BY MOUTH EVERY 8 (EIGHT) HOURS AS NEEDED FOR NAUSEA OR VOMITING.   30 tablet   0   . pregabalin (LYRICA) 50 MG capsule   Oral   Take 1 capsule (50 mg total) by mouth 3 (three) times daily.   60 capsule   1   . rOPINIRole (REQUIP) 3 MG tablet      TAKE 1 TABLET BY MOUTH AT BEDTIME   90 tablet   0   . simvastatin (ZOCOR) 10 MG tablet      TAKE 1 TABLET BY MOUTH AT BEDTIME   90 tablet   1     NEEDS REFILLS   . SYMBICORT 160-4.5 MCG/ACT inhaler      INHALE 2 PUFFS INTO THE LUNGS 2 (TWO) TIMES DAILY.   10.2 Inhaler   0     Patient must schedule office visit before future r ...   . tolterodine (DETROL LA) 2 MG 24 hr capsule   Oral   Take 1 capsule (2 mg total) by mouth daily.   90 capsule   2   . traMADol (ULTRAM) 50 MG tablet      TAKE 2 TABLETS BY MOUTH EVERY MORNING AND 2 TABLETS BY MOUTH EVERY EVENING   120 tablet   0     Not to exceed 5 additional fills before 10/04/2015 ...   . zolpidem (AMBIEN) 5 MG tablet   Oral   Take 1 tablet (5 mg total) by mouth at bedtime.   30 tablet   0     Not to exceed 5 additional fills before 10/11/2015 ...     Allergies Doxycycline  Family History  Problem Relation Age of Onset  . Colon cancer Neg Hx   . Esophageal cancer Neg Hx   . Rectal cancer Neg Hx   . Breast cancer Mother   . Lung cancer Mother   . Stomach cancer Father   . Diabetes      4 aunts, and 1 uncle    Social History Social History  Substance Use Topics  . Smoking status: Former Smoker -- 1.50 packs/day for 50 years    Types: Cigarettes    Quit date: 04/06/2013  . Smokeless  tobacco: Never Used  . Alcohol Use: No    Review of Systems  Constitutional: Negative for fever. Eyes: Negative for visual changes. ENT: Negative for sore throat. Cardiovascular: Negative for chest pain. Respiratory: Negative for shortness of breath. Gastrointestinal: Negative for abdominal pain, vomiting and diarrhea. Genitourinary: Negative for dysuria. Musculoskeletal: Negative for back pain. Skin: Negative for rash. Neurological: Negative for headaches, focal weakness or numbness.   10-point ROS otherwise negative.  ____________________________________________   PHYSICAL EXAM:  VITAL SIGNS: ED Triage Vitals  Enc Vitals Group     BP 07/15/15 0530 131/65 mmHg     Pulse Rate 07/15/15 0531 91     Resp --      Temp --      Temp src --      SpO2 07/15/15 0531 97 %     Weight --      Height --  Head Cir --      Peak Flow --      Pain Score --      Pain Loc --      Pain Edu? --    Constitutional: Alert and oriented. Well appearing and in no distress. Eyes: Conjunctivae are normal. PERRL. Normal extraocular movements. ENT   Head: Normocephalic and atraumatic.   Nose: No congestion/rhinnorhea.   Mouth/Throat: Mucous membranes are moist.   Neck: No stridor. Hematological/Lymphatic/Immunilogical: No cervical lymphadenopathy. Cardiovascular: Normal rate, regular rhythm. Normal and symmetric distal pulses are present in all extremities. No murmurs, rubs, or gallops. Respiratory: Normal respiratory effort without tachypnea nor retractions. Breath sounds are clear and equal bilaterally. No wheezes/rales/rhonchi. Gastrointestinal: Soft and nontender. No distention. There is no CVA tenderness. Genitourinary: deferred Musculoskeletal: Nontender with normal range of motion in all extremities. No joint effusions.  No lower extremity tenderness nor edema. Neurologic:  Normal speech and language. No gross focal neurologic deficits are appreciated. Speech is normal.   Skin:  Skin is warm, dry and intact. No rash noted. Psychiatric: Mood and affect are normal. Speech and behavior are normal. Patient exhibits appropriate insight and judgment.  ____________________________________________    LABS (pertinent positives/negatives)  Labs Reviewed  GLUCOSE, CAPILLARY - Abnormal; Notable for the following:    Glucose-Capillary 288 (*)    All other components within normal limits  BASIC METABOLIC PANEL  CBC  URINALYSIS COMPLETEWITH MICROSCOPIC ([facility] ONLY)  CBG MONITORING, ED        INITIAL IMPRESSION / ASSESSMENT AND PLAN / ED COURSE  Pertinent labs & imaging results that were available during my care of the patient were reviewed by me and considered in my medical decision making (see chart for details).  History physical exam lab data consistent with hyperglycemia most likely secondary to not optimal hyperglycemic management in addition to the fact the patient was not able to take her metformin today secondary to a CAT scan which was performed yesterday. Patient will receive glipizide emergency department will be prescribed the same for home. Patient is advised to follow-up with her primary care provider today for ongoing diabetic management. ____________________________________________   FINAL CLINICAL IMPRESSION(S) / ED DIAGNOSES  Final diagnoses:  Hyperglycemia      Gregor Hams, MD 07/15/15 [phone]

## 2015-07-15 NOTE — Telephone Encounter (Signed)
Pt has appt 07/16/15 at 11:45 with Dr Deborra Medina.

## 2015-07-16 ENCOUNTER — Ambulatory Visit (INDEPENDENT_AMBULATORY_CARE_PROVIDER_SITE_OTHER): Payer: Medicare Other | Admitting: Family Medicine

## 2015-07-16 ENCOUNTER — Encounter: Payer: Self-pay | Admitting: Family Medicine

## 2015-07-16 VITALS — BP 136/78 | HR 84 | Temp 97.9°F | Wt 220.5 lb

## 2015-07-16 DIAGNOSIS — E118 Type 2 diabetes mellitus with unspecified complications: Secondary | ICD-10-CM | POA: Diagnosis not present

## 2015-07-16 DIAGNOSIS — E1165 Type 2 diabetes mellitus with hyperglycemia: Secondary | ICD-10-CM | POA: Diagnosis not present

## 2015-07-16 DIAGNOSIS — R739 Hyperglycemia, unspecified: Secondary | ICD-10-CM

## 2015-07-16 LAB — HEMOGLOBIN A1C: HEMOGLOBIN A1C: 8.8 % — AB (ref 4.6–6.5)

## 2015-07-16 NOTE — Assessment & Plan Note (Signed)
Deteriorated with hyperglycemia. Has increased significant- a1c jumped from 6 to 7.1. ?secondary to psych meds and steroid use. Continue glipizide 5 mg daily along with Metformin 1000 mg twice daily. Check labs today. The patient indicates understanding of these issues and agrees with the plan.

## 2015-07-16 NOTE — Progress Notes (Signed)
Pre visit review using our clinic review tool, if applicable. No additional management support is needed unless otherwise documented below in the visit note. 

## 2015-07-16 NOTE — Patient Instructions (Signed)
Good to see you. Continue taking the glipizide 5 mg and Metformin 1000 mg twice daily as prescribed.  We will call you with your lab results.

## 2015-07-16 NOTE — Progress Notes (Signed)
Subjective:   Patient ID: Stephanie [name], female    DOB: Aug 16, 1950, 65 y.o.   MRN: [id number]  Stephanie [name] is a pleasant 65 y.o. year old female who presents to clinic today with Hospitalization Follow-up  on 07/16/2015  HPI:  Was seen in ED 07/15/15 for hyperglycemia. Notes, labs and studies reviewed.  Went to ED with persistently elevated FSBS.  Was 402 the morning she went to ED. Had been compliant with Metformin 1000 mg twice daily. She had held her Metformin for a day however for a CT scan.  Also recently had a steroid injection in her knee.  FSBS in ED was 288  Glipizide 5 mg daily added to her Metformin and advised to follow up with me as noon as possible. Lab Results  Component Value Date   HGBA1C 7.1* 04/20/2015   Of note, was also seen in the ED the day prior, 07/14/15 for same complaint.  CT of abdomen/pelvis was done as she was complaining of abdominal pain.  This was unremarkable.  Was given IVFs and discharged home.   Feels better today.  FSBS here today 205. Abdominal pain has resolved. Ct Abdomen Pelvis W Contrast  07/14/2015  CLINICAL DATA:  Patient with left lower and right lower quadrant abdominal pain radiating to the back for 1 week. Nausea. No reported vomiting. EXAM: CT ABDOMEN AND PELVIS WITH CONTRAST TECHNIQUE: Multidetector CT imaging of the abdomen and pelvis was performed using the standard protocol following bolus administration of intravenous contrast. CONTRAST:  1101m OMNIPAQUE IOHEXOL 300 MG/ML  SOLN COMPARISON:  CT abdomen pelvis 05/12/2015 FINDINGS: Lower chest: Normal heart size. Dependent atelectasis left lower lobe. Hepatobiliary: The liver is nodular in contour compatible with history of cirrhosis. Gallbladder surgically absent. Unchanged exophytic nodule from the left hepatic lobe. Pancreas: Pancreatic parenchyma is atrophic. Spleen: Enlarged measuring 16.5 cm Adrenals/Urinary Tract: The adrenal glands are normal. Kidneys enhance symmetrically  with contrast. No hydronephrosis. Urinary bladder is unremarkable. Stomach/Bowel: No abnormal bowel wall thickening or evidence for bowel obstruction. No free fluid or free intraperitoneal air. Multiple dilated mesenteric vessels are demonstrated from a splenomesenteric shunt. Vascular/Lymphatic: Normal caliber abdominal aorta. No retroperitoneal lymphadenopathy. Other: Status posthysterectomy. Musculoskeletal: No aggressive or acute appearing osseous lesions. Lumbar spine degenerative changes. Lower lumbar spinal fusion hardware. Dorsal column stimulator the lower thoracic level. IMPRESSION: No acute process within the abdomen or pelvis. Cirrhosis and portal venous hypertension. Electronically Signed   By: DLovey NewcomerM.D.   On: 07/14/2015 17:28   Dg Knee Complete 4 Views Right  06/23/2015  CLINICAL DATA:  Right knee pain after fall.  Initial evaluation . EXAM: RIGHT KNEE - COMPLETE 4+ VIEW COMPARISON:  08/06/2012. FINDINGS: Mild tricompartment degenerative change. No acute bony abnormality identified. Small knee joint effusion. IMPRESSION: Mild tricompartment degenerative change. No evidence fracture or dislocation.Small knee joint effusion. Electronically Signed   By: TMarcello Moores Register   On: 06/23/2015 14:15   Ct Chest Nodule Follow Up Low Dose W/o  06/30/2015  CLINICAL DATA:  65year old female with history of pulmonary nodule. Followup study. EXAM: CT CHEST WITHOUT CONTRAST TECHNIQUE: Multidetector CT imaging of the chest was performed following the standard protocol without IV contrast. COMPARISON:  PE protocol CT scan 02/19/2015.  Multiple other priors. FINDINGS: Mediastinum/Lymph Nodes: Heart size is normal. There is no significant pericardial fluid, thickening or pericardial calcification. There is atherosclerosis of the thoracic aorta, the great vessels of the mediastinum and the coronary arteries, including calcified atherosclerotic plaque in the left  main, left anterior descending and right coronary  arteries. No pathologically enlarged mediastinal or hilar lymph nodes. Please note that accurate exclusion of hilar adenopathy is limited on noncontrast CT scans. Esophagus is unremarkable in appearance. No axillary lymphadenopathy. Lungs/Pleura: Again noted is a pleural-based nodular area of ground-glass attenuation in the apex of the right upper lobe (image 19 of series 3) measuring 7 mm, which is unchanged compared to prior examinations dating back to 06/22/2011, considered benign, likely an area of chronic scarring. No other larger more suspicious appearing pulmonary nodules or masses are noted. No acute consolidative airspace disease. No pleural effusions. Upper Abdomen: Nodular contour of the liver, suggesting underlying cirrhosis. Diffuse low attenuation throughout the hepatic parenchyma, compatible with hepatic steatosis. Spleen is incompletely visualized, but appears enlarged measuring up to 13.7 x 6.1 cm on axial images. Musculoskeletal/Soft Tissues: Orthopedic fixation hardware in the lower cervical spine. Spinal cord stimulator in the mid thoracic region incidentally noted. There are no aggressive appearing lytic or blastic lesions noted in the visualized portions of the skeleton. IMPRESSION: 1. 7 mm ground-glass attenuation pleural-based nodule in the apex of the right upper lobe is unchanged on prior studies dating back to 06/22/2011, considered benign, likely an area of chronic post infectious or inflammatory scarring. No future imaging followup is recommended. 2. Atherosclerosis, including left main and 2 vessel coronary artery disease. Please note that although the presence of coronary artery calcium documents the presence of coronary artery disease, the severity of this disease and any potential stenosis cannot be assessed on this non-gated CT examination. Assessment for potential risk factor modification, dietary therapy or pharmacologic therapy may be warranted, if clinically indicated. 3.  Hepatic steatosis with stigmata of cirrhosis and portal hypertension, including splenomegaly. Electronically Signed   By: Vinnie Langton M.D.   On: 06/30/2015 13:56    Current Outpatient Prescriptions on File Prior to Visit  Medication Sig Dispense Refill  . ACCU-CHEK AVIVA PLUS test strip Use as instructed to test blood sugar once daily E11.8 100 each 1  . ARIPiprazole (ABILIFY) 10 MG tablet     . ARIPiprazole (ABILIFY) 20 MG tablet     . clonazePAM (KLONOPIN) 0.5 MG tablet Take 1 tablet (0.5 mg total) by mouth 2 (two) times daily. 60 tablet 0  . dicyclomine (BENTYL) 10 MG capsule Take 1 capsule (10 mg total) by mouth 3 (three) times daily before meals. 90 capsule 5  . docusate sodium (COLACE) 100 MG capsule TAKE 1 CAPSULE BY MOUTH 2 TIMES DAILY AS NEEDED FOR CONSTIPATION 60 capsule 5  . glipiZIDE (GLUCOTROL) 5 MG tablet Take 1 tablet (5 mg total) by mouth daily before breakfast. 30 tablet 0  . lamoTRIgine (LAMICTAL) 100 MG tablet Take 3 tablets by mouth once daily    . Lancets Misc. (ACCU-CHEK SOFTCLIX LANCET DEV) KIT USE  TO CHECK BLOOD SUGAR ONE TIME DAILY 1 kit 0  . metFORMIN (GLUCOPHAGE) 1000 MG tablet TAKE 1 TABLET (1,000 MG TOTAL) BY MOUTH 2 (TWO) TIMES DAILY WITH A MEAL. 180 tablet 1  . metoCLOPramide (REGLAN) 10 MG tablet Take 1 tablet (10 mg total) by mouth 4 (four) times daily -  before meals and at bedtime. 60 tablet 0  . mirtazapine (REMERON) 15 MG tablet TAKE 1 TABLET BY MOUTH AT BEDTIME 34 tablet 2  . nystatin (MYCOSTATIN) powder Apply topically 2 (two) times daily. 15 g 0  . omeprazole (PRILOSEC) 40 MG capsule TAKE ONE CAPSULE BY MOUTH EVERY DAY (Patient taking differently: TAKE two CAPSULE  BY MOUTH EVERY DAY) 90 capsule 3  . ondansetron (ZOFRAN-ODT) 4 MG disintegrating tablet TAKE 1 TABLET (4 MG TOTAL) BY MOUTH EVERY 8 (EIGHT) HOURS AS NEEDED FOR NAUSEA OR VOMITING. 30 tablet 0  . pregabalin (LYRICA) 50 MG capsule Take 1 capsule (50 mg total) by mouth 3 (three) times daily. 60  capsule 1  . rOPINIRole (REQUIP) 3 MG tablet TAKE 1 TABLET BY MOUTH AT BEDTIME 90 tablet 0  . simvastatin (ZOCOR) 10 MG tablet TAKE 1 TABLET BY MOUTH AT BEDTIME 90 tablet 1  . SYMBICORT 160-4.5 MCG/ACT inhaler INHALE 2 PUFFS INTO THE LUNGS 2 (TWO) TIMES DAILY. 10.2 Inhaler 0  . tolterodine (DETROL LA) 2 MG 24 hr capsule Take 1 capsule (2 mg total) by mouth daily. 90 capsule 2  . traMADol (ULTRAM) 50 MG tablet TAKE 2 TABLETS BY MOUTH EVERY MORNING AND 2 TABLETS BY MOUTH EVERY EVENING 120 tablet 0  . zolpidem (AMBIEN) 5 MG tablet Take 1 tablet (5 mg total) by mouth at bedtime. 30 tablet 0   No current facility-administered medications on file prior to visit.    Allergies  Allergen Reactions  . Doxycycline Other (See Comments)    Face red and swollen; no difficulty breathing    Past Medical History  Diagnosis Date  . Adenomatous colon polyp 08/27/2014    Polyps x 3  . Insomnia   . RLS (restless legs syndrome)   . Colon polyp   . Incontinence   . H/O: rheumatic fever   . Hyperlipidemia   . Unspecified disorders of nervous system   . Bipolar affective disorder ([place])     h/o  . H/O: CVA (cardiovascular accident)     TIA  . Fatty liver 04/09/08    found in abd CT  . Shortness of breath   . Obstructive sleep apnea     not using CPAP, last sleep study 2.5 years ago, per pt doctor is aware  . Blood transfusion 2000  . GERD (gastroesophageal reflux disease)   . Arthritis   . 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 Asthma   . Anxiety   . Depression   . Stroke [facility])     TIA- 2002  . Ulcer   . Diabetes mellitus without complication ([place])   . Anemia   . Blood transfusion without reported diagnosis   . Sleep apnea   . Platelets decreased ([place])   . 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 Back surgery      x 45  . Cholecystectomy    . Rotator cuff repair      left  . Abdominal hysterectomy      total  . Lumbar fusion   10/09  . Hemorroidectomy      and colon polyp removed  . Abdominal exploration surgery    . Posterior cervical fusion/foraminotomy    . Eye surgery      bilat  . Lumbar wound debridement  05/09/2011    Procedure: LUMBAR WOUND DEBRIDEMENT;  Surgeon: Dahlia Bailiff;  Location: [facility];  Service: Orthopedics;  Laterality: N/A;  IRRIGATION AND DEBRIDEMENT SPINAL WOUND  . Axillary artery cannulation via 8-mm hemashield graft, median sternotomy, extracorporeal circulation with deep hypothermic circulatory arrest, repair of  aortic dessection  01/23/2009    Dr Arlyce Dice  . Cataract      both eyes  . Colonoscopy    .  Liver biopsy  04/10/2012    Procedure: LIVER BIOPSY;  Surgeon: Inda Castle, MD;  Location: [facility];  Service: Endoscopy;  Laterality: N/A;  ultrasound to mark order for abd limited/liver to be marked to be sent by [email or web address]     Family History  Problem Relation Age of Onset  . Colon cancer Neg Hx   . Esophageal cancer Neg Hx   . Rectal cancer Neg Hx   . Breast cancer Mother   . Lung cancer Mother   . Stomach cancer Father   . Diabetes      4 aunts, and 1 uncle    Social History   Social History  . Marital Status: Single    Spouse Name: N/A  . Number of Children: 2  . Years of Education: N/A   Occupational History  . Disabled    Social History Main Topics  . Smoking status: Former Smoker -- 1.50 packs/day for 50 years    Types: Cigarettes    Quit date: 04/06/2013  . Smokeless tobacco: Never Used  . Alcohol Use: No  . Drug Use: No  . Sexual Activity: Not on file   Other Topics Concern  . Not on file   Social History Narrative   1 son, 91+ y/o   Daily caffeine use: 2 cups daily   Does not get regular exercise   Disability due to bipolar      Has a living will- she is a DNR (she has form at home per pt).   The PMH, PSH, Social History, Family History, Medications, and allergies have been reviewed in [facility], and have been updated if relevant.  Review  of Systems  Constitutional: Negative.   HENT: Negative.   Eyes: Negative.   Respiratory: Negative.   Cardiovascular: Negative.   Gastrointestinal: Negative.   Endocrine: Negative.   Genitourinary: Negative.   Musculoskeletal: Negative.   Skin: Negative.   Allergic/Immunologic: Negative.   Neurological: Negative.   Hematological: Negative.   Psychiatric/Behavioral: Negative.   All other systems reviewed and are negative.      Objective:    BP 136/78 mmHg  Pulse 84  Temp(Src) 97.9 F (36.6 C) (Oral)  Wt 220 lb 8 oz (100.018 kg)  SpO2 97%   Physical Exam  Constitutional: She is oriented to person, place, and time. She appears well-developed and well-nourished. No distress.  HENT:  Head: Normocephalic.  Eyes: Conjunctivae are normal.  Cardiovascular: Normal rate.   Pulmonary/Chest: Effort normal.  Musculoskeletal: Normal range of motion.  Neurological: She is alert and oriented to person, place, and time. No cranial nerve deficit.  Skin: Skin is warm and dry. She is not diaphoretic.  Psychiatric: She has a normal mood and affect. Her behavior is normal. Judgment and thought content normal.  Nursing note and vitals reviewed.         Assessment & Plan:   Poorly controlled type 2 diabetes mellitus with complication ([place]) - Plan: Hemoglobin A1c  Hyperglycemia - Plan: Hemoglobin A1c No Follow-up on file.

## 2015-07-17 ENCOUNTER — Telehealth: Payer: Self-pay | Admitting: Family Medicine

## 2015-07-17 NOTE — Telephone Encounter (Signed)
Pt called checking on lab results Best number [phone]

## 2015-07-17 NOTE — Telephone Encounter (Signed)
See results note. 

## 2015-07-20 ENCOUNTER — Telehealth: Payer: Self-pay | Admitting: Family Medicine

## 2015-07-20 ENCOUNTER — Other Ambulatory Visit: Payer: Self-pay | Admitting: Family Medicine

## 2015-07-20 ENCOUNTER — Telehealth: Payer: Self-pay

## 2015-07-20 NOTE — Telephone Encounter (Signed)
Left message on voicemail for patient to call back. 

## 2015-07-20 NOTE — Telephone Encounter (Signed)
[facility]  Patient Name: Stephanie [name]  DOB: 12/01/1950    Initial Comment Caller states [phone] with Metformin medication   Nurse Assessment      Guidelines    Guideline Title Affirmed Question Affirmed Notes       Final Disposition User   FINAL ATTEMPT MADE - message left [place], Therapist, sports, Tillie Rung

## 2015-07-20 NOTE — Telephone Encounter (Signed)
Let's check it again today.  It does seem to be slowly trending down.

## 2015-07-20 NOTE — Telephone Encounter (Signed)
Patient notified as instructed by telephone and verbalized understanding. Patient stated that her BS now is 227 and she ate about 4 hours ago.

## 2015-07-20 NOTE — Telephone Encounter (Signed)
Pt called back; 07/18/15 FBS was 261; 07/19/15 FBS was 291, today pt has not checked BS. Pt is feeling OK but feels a little shaky. Pt wants to know what to do about BS.  Pt request cb.

## 2015-07-20 NOTE — Telephone Encounter (Signed)
Pt left v/m returning call and request cb.

## 2015-07-20 NOTE — Telephone Encounter (Signed)
See phone note 07/20/15.

## 2015-07-20 NOTE — Telephone Encounter (Signed)
Pt left v/m requesting cb so can give BS log. Left v/m requesting cb.

## 2015-07-20 NOTE — Telephone Encounter (Signed)
Noted.  It seems to be trending down.  Please call us back in a day or two with more readings.

## 2015-07-21 ENCOUNTER — Ambulatory Visit (INDEPENDENT_AMBULATORY_CARE_PROVIDER_SITE_OTHER): Payer: Medicare Other | Admitting: Gastroenterology

## 2015-07-21 ENCOUNTER — Other Ambulatory Visit: Payer: Self-pay | Admitting: Family Medicine

## 2015-07-21 ENCOUNTER — Encounter: Payer: Self-pay | Admitting: Gastroenterology

## 2015-07-21 VITALS — BP 118/76 | HR 88 | Ht 63.0 in | Wt 219.1 lb

## 2015-07-21 DIAGNOSIS — G8929 Other chronic pain: Secondary | ICD-10-CM | POA: Diagnosis not present

## 2015-07-21 DIAGNOSIS — R1013 Epigastric pain: Secondary | ICD-10-CM | POA: Diagnosis not present

## 2015-07-21 DIAGNOSIS — K7469 Other cirrhosis of liver: Secondary | ICD-10-CM

## 2015-07-21 MED ORDER — DICYCLOMINE HCL 10 MG PO CAPS: 10.0000 mg | ORAL_CAPSULE | Freq: Three times a day (TID) | ORAL | Status: DC

## 2015-07-21 NOTE — Telephone Encounter (Signed)
Patient notified as instructed by telephone and verbalized understanding. 

## 2015-07-21 NOTE — Progress Notes (Signed)
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 Previously followed by Dr. Deatra Ina, new patient to me.   Patient is unsure how long she had cirrhosis, she thinks she was diagnosed about 2 years ago. She had a liver biopsy in 2013 showing NASH but without cirrhotic changes at the time. She has had cirrhosis thought to be due to NASH / fatty liver based on her prior biopsy and course. She has had prior jaundice, she thinks she has had hepatitis B previously, she is not sure of a history of ascites. It is listed in her record she has chronic hepatitis B, however prior hepatitis B core AB negative. She has had fluctuations in her weight with progressive weight gain.   She has some ongoing nausea, and abdominal discomfort. She thinks she has had this ongoing for a few years. She has abdominal pain chronically throughout her entire abdomen, present essentially all the time but severity can fluctuate. Pain is in the middle to upper abdomen when at worse, although she is tender throughout the entire abdomen. She thinks the symptoms are stable over time. She takes Pamine (methscopolamine) 2.39m tablet five times per day for this issue. She also takes chronic valium and klonopin for anxiety, as well as phenergan for chronic nausea mobic daily for her abdominal pains. She also takes ultram PRN. She is eating ok, but eating small meals. She does not have much appetite. She thinks she has gained weight, cannot lose weight. No fevers. No vomiting. Eating can make some her pain worse at times but not routinely. She is not sure if she has had a prior hepatic encephalopathy but admits to being confused periodically. She otherwise endorses some chronic constipation which is stable.   She otherwise reports she had a fall when she fell out of a chair in recent months, she was told she had a concussion and hurt her tailbone leading to a short  hospitalization. She was also hospitalized for a panic attack 2 months ago.   She otherwise endorses takes prilosec 461monce daily and states she is compliant. She has had EGD, gastric emptying study, CT scans to evaluate her abdominal pain.   Prior workup Colonoscopy 3/16 - 3 small adenomas EGD 04/10/14 - mild gastritis, otherwise normal, no varices Liver biopsy 04/2012 - NASH but no cirrhosis CT abdomen 1/16 - liver with cirrhosis and associated splenomegaly Normal gastric emptying study 2015  SINCE LAST VISIT:  Patient here for follow up. She reports she is feeling better than she had in recent weeks. She was seen in the ER for hyperglycemia a few times related to her DM. She continues to have some soreness in her mid and upper abdomen which is present all the time and unchanged from previous. It's tender to the touch. She states this has been ongoing for several months. She has been continued prilosec once daily as well as bentyl BID. Other than her abdominal discomfort she is feeling pretty well without changes. She has had 2 CT scans which have not showed a clear etiology for her pain to date. She has had labs recently which show a mild increase in AP. We made several changes to her medications on our first visit.   She has reglan listed in her medication chart but she is not sure if she takes it. She has had a negative Gastric emptying study. She has since stopped reglan and using  zofran PRN for nausea. We stopped phenergan to use zofran, we stopped her meloxicam. I had recommended she stop benzodiazepines in light of her liver disease.    Past Medical History  Diagnosis Date  . Adenomatous colon polyp 08/27/2014    Polyps x 3  . Insomnia   . RLS (restless legs syndrome)   . Colon polyp   . Incontinence   . H/O: rheumatic fever   . Hyperlipidemia   . Unspecified disorders of nervous system   . Bipolar affective disorder ([place])     h/o  . H/O: CVA (cardiovascular accident)      TIA  . Fatty liver 04/09/08    found in abd CT  . Shortness of breath   . Obstructive sleep apnea     not using CPAP, last sleep study 2.5 years ago, per pt doctor is aware  . Blood transfusion 2000  . GERD (gastroesophageal reflux disease)   . Arthritis   . 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 Asthma   . Anxiety   . Depression   . Stroke [facility])     TIA- 2002  . Ulcer   . Diabetes mellitus without complication ([place])   . Anemia   . Blood transfusion without reported diagnosis   . Sleep apnea   . Platelets decreased ([place])   . Cirrhosis ([place])     related to NASH  . Chronic abdominal pain     abdominal wall pain     Past Surgical History  Procedure Laterality Date  . Back surgery      x 45  . Cholecystectomy    . Rotator cuff repair      left  . Abdominal hysterectomy      total  . Lumbar fusion  10/09  . Hemorroidectomy      and colon polyp removed  . Abdominal exploration surgery    . Posterior cervical fusion/foraminotomy    . Eye surgery      bilat  . Lumbar wound debridement  05/09/2011    Procedure: LUMBAR WOUND DEBRIDEMENT;  Surgeon: Dahlia Bailiff;  Location: [facility];  Service: Orthopedics;  Laterality: N/A;  IRRIGATION AND DEBRIDEMENT SPINAL WOUND  . Axillary artery cannulation via 8-mm hemashield graft, median sternotomy, extracorporeal circulation with deep hypothermic circulatory arrest, repair of  aortic dessection  01/23/2009    Dr Arlyce Dice  . Cataract      both eyes  . Colonoscopy    . Liver biopsy  04/10/2012    Procedure: LIVER BIOPSY;  Surgeon: Inda Castle, MD;  Location: [facility];  Service: Endoscopy;  Laterality: N/A;  ultrasound to mark order for abd limited/liver to be marked to be sent by [email or web address]    Family History  Problem Relation Age of Onset  . Colon cancer Neg Hx   . Esophageal cancer Neg Hx   . Rectal cancer Neg Hx   . Breast cancer Mother   . Lung cancer Mother   . Stomach cancer Father   . Diabetes      4 aunts, and 1  uncle   Social History  Substance Use Topics  . Smoking status: Former Smoker -- 1.50 packs/day for 50 years    Types: Cigarettes    Quit date: 04/06/2013  . Smokeless tobacco: Never Used  . Alcohol Use: No   Current Outpatient Prescriptions  Medication Sig Dispense Refill  . ACCU-CHEK AVIVA PLUS test strip Use as instructed to test blood sugar once  daily E11.8 100 each 1  . ARIPiprazole (ABILIFY) 10 MG tablet     . ARIPiprazole (ABILIFY) 20 MG tablet     . clonazePAM (KLONOPIN) 0.5 MG tablet Take 1 tablet (0.5 mg total) by mouth 2 (two) times daily. 60 tablet 0  . dicyclomine (BENTYL) 10 MG capsule Take 1 capsule (10 mg total) by mouth 3 (three) times daily before meals. 90 capsule 3  . docusate sodium (COLACE) 100 MG capsule TAKE 1 CAPSULE BY MOUTH 2 TIMES DAILY AS NEEDED FOR CONSTIPATION 60 capsule 5  . glipiZIDE (GLUCOTROL) 5 MG tablet Take 1 tablet (5 mg total) by mouth daily before breakfast. 30 tablet 0  . lamoTRIgine (LAMICTAL) 100 MG tablet Take 3 tablets by mouth once daily    . Lancets Misc. (ACCU-CHEK SOFTCLIX LANCET DEV) KIT USE  TO CHECK BLOOD SUGAR ONE TIME DAILY 1 kit 0  . metFORMIN (GLUCOPHAGE) 1000 MG tablet TAKE 1 TABLET (1,000 MG TOTAL) BY MOUTH 2 (TWO) TIMES DAILY WITH A MEAL. 180 tablet 1  . metoCLOPramide (REGLAN) 10 MG tablet Take 1 tablet (10 mg total) by mouth 4 (four) times daily -  before meals and at bedtime. 60 tablet 0  . mirtazapine (REMERON) 15 MG tablet TAKE 1 TABLET BY MOUTH AT BEDTIME 34 tablet 2  . nystatin (MYCOSTATIN) powder Apply topically 2 (two) times daily. 15 g 0  . omeprazole (PRILOSEC) 40 MG capsule TAKE ONE CAPSULE BY MOUTH EVERY DAY (Patient taking differently: TAKE two CAPSULE BY MOUTH EVERY DAY) 90 capsule 3  . ondansetron (ZOFRAN-ODT) 4 MG disintegrating tablet TAKE 1 TABLET (4 MG TOTAL) BY MOUTH EVERY 8 (EIGHT) HOURS AS NEEDED FOR NAUSEA OR VOMITING. 30 tablet 0  . pregabalin (LYRICA) 50 MG capsule Take 1 capsule (50 mg total) by mouth 3  (three) times daily. 60 capsule 1  . rOPINIRole (REQUIP) 3 MG tablet TAKE 1 TABLET BY MOUTH AT BEDTIME 90 tablet 0  . simvastatin (ZOCOR) 10 MG tablet TAKE 1 TABLET BY MOUTH AT BEDTIME 90 tablet 1  . SYMBICORT 160-4.5 MCG/ACT inhaler INHALE 2 PUFFS INTO THE LUNGS 2 (TWO) TIMES DAILY. 10.2 Inhaler 0  . tolterodine (DETROL LA) 2 MG 24 hr capsule Take 1 capsule (2 mg total) by mouth daily. 90 capsule 2  . traMADol (ULTRAM) 50 MG tablet TAKE 2 TABLETS BY MOUTH EVERY MORNING AND 2 TABLETS BY MOUTH EVERY EVENING 120 tablet 0  . zolpidem (AMBIEN) 5 MG tablet TAKE ONE TABLET BY MOUTH AT BEDTIME. 30 tablet 0   No current facility-administered medications for this visit.   Allergies  Allergen Reactions  . Doxycycline Other (See Comments)    Face red and swollen; no difficulty breathing     Review of Systems: All systems reviewed and negative except where noted in HPI.   Lab Results  Component Value Date   WBC 5.9 07/15/2015   HGB 10.6* 07/15/2015   HCT 32.4* 07/15/2015   MCV 84.5 07/15/2015   PLT 124* 07/15/2015    Lab Results  Component Value Date   CREATININE 0.74 07/15/2015   BUN 13 07/15/2015   NA 135 07/15/2015   K 3.8 07/15/2015   CL 104 07/15/2015   CO2 24 07/15/2015    Lab Results  Component Value Date   ALT 26 07/14/2015   AST 43* 07/14/2015   ALKPHOS 198* 07/14/2015   BILITOT 0.7 07/14/2015   Lab Results  Component Value Date   INR 0.9 02/03/2012   INR 0.8 RATIO 04/30/2007  Physical Exam: BP 118/76 mmHg  Pulse 88  Ht 5' 3"  (1.6 m)  Wt 219 lb 2 oz (99.394 kg)  BMI 38.83 kg/m2 Constitutional: Pleasant,well-developed, female in no acute distress. HEENT: Normocephalic and atraumatic. Conjunctivae are normal. No scleral icterus. Neck supple.  Cardiovascular: Normal rate, regular rhythm.  Pulmonary/chest: Effort normal and breath sounds normal. No wheezing, rales or rhonchi. Abdominal: Soft, nondistender, tenderness to epigastric area along bra line and  mid abdomen along where waist band is from pants. (+) Carnett. Bowel sounds active throughout. There are no masses palpable. No hepatomegaly. Extremities: no edema Lymphadenopathy: No cervical adenopathy noted. Neurological: Alert and oriented to person place and time. NO asterixis Skin: Skin is warm and dry. No rashes noted. Psychiatric: Normal mood and affect. Behavior is normal.   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 She otherwise has chronic abdominal pain for which she has had a negative evaluation as outlined above. On exam the pain is easily reproducible and with a positive Carnett sign I suspect she has abdominal wall pain, perhaps related to her obesity. Her bra line and weight band line are the areas in which she is tender and I counseled her on this entity for a while today, as she has had 2 CT scans recently for this which don't show any other pathology for her pain.   At this time I am recommending the following:  Abdominal wall pain / musculoskeletal pain appears to be the most likely cause for her chronic abdominal pain - continue Lyrica as scheduled - wear loose fitting clothes / bras to minimize abdominal wall pressure - trial of Capsaicin cream topically for abdominal pain - we discussed other pain medications for this issue (TCAs), although given her liver disease and other medications, would hold off on TCA for now - consider pain management consultation for trigger point injection if pain persists despite conservative therapies - weight loss via diet / exercise as tolerated  NASH related cirrhosis - compensated - EGD for varices screening every 2-3 years, next due sometime between 2017-2018 - [place] screening in August 2017 (last normal  imaging Feb, stable changes) - would repeat LFTs in a month or so to trend AP given recent elevation, no pathology noted on imaging to cause this - no alcohol  Follow up in 3 months for reassessment. All questions answered.   Monee Cellar, MD [facility] Pager [phone]

## 2015-07-21 NOTE — Telephone Encounter (Signed)
Received refill request electronically Last refill Tramadol 06/23/15 #120 Last refill Zolpidem 06/23/15 #30 Last office visit 07/16/15 Is it okay to refill?

## 2015-07-21 NOTE — Patient Instructions (Signed)
Your physician has requested that you go to the basement for lab work in 3 weeks.   We have sent the following medications to your pharmacy for you to pick up at your convenience: Bentyl

## 2015-07-21 NOTE — Telephone Encounter (Signed)
Refills called to pharmacy as instructed.

## 2015-08-03 ENCOUNTER — Ambulatory Visit: Payer: Medicare Other | Admitting: Family Medicine

## 2015-08-03 ENCOUNTER — Telehealth: Payer: Self-pay | Admitting: Family Medicine

## 2015-08-03 NOTE — Telephone Encounter (Signed)
Patient did not come for their scheduled appointment today for 3-4 week follow up  Please let me know if the patient needs to be contacted immediately for follow up or if no follow up is necessary.

## 2015-08-03 NOTE — Telephone Encounter (Signed)
No f/u - only if deemed necessary by patient with pain / symptoms

## 2015-08-04 ENCOUNTER — Telehealth: Payer: Self-pay

## 2015-08-04 MED ORDER — ACCU-CHEK SOFTCLIX LANCET DEV KIT: PACK | Status: DC

## 2015-08-04 MED ORDER — ACCU-CHEK AVIVA DEVI: Status: DC

## 2015-08-04 NOTE — Addendum Note (Signed)
Addended by: Helene Shoe on: 08/04/2015 03:03 PM   Modules accepted: Orders

## 2015-08-04 NOTE — Telephone Encounter (Addendum)
Stephanie [name] with [facility] left v/m; pt lost her lancet device. Lancet device refilled.

## 2015-08-04 NOTE — Telephone Encounter (Signed)
Pt left v/m; pt has lost accuchek aviva device and request another order for device sent to [facility]; pt has strips and lancets. Device sent.

## 2015-08-04 NOTE — Telephone Encounter (Signed)
Pt called back to verify order was sent for diabetic device. Advised pt done and she voiced understanding,.

## 2015-08-05 ENCOUNTER — Ambulatory Visit (INDEPENDENT_AMBULATORY_CARE_PROVIDER_SITE_OTHER): Payer: Medicare Other | Admitting: *Deleted

## 2015-08-05 ENCOUNTER — Ambulatory Visit: Payer: Medicare Other | Admitting: Family Medicine

## 2015-08-05 DIAGNOSIS — E1149 Type 2 diabetes mellitus with other diabetic neurological complication: Secondary | ICD-10-CM

## 2015-08-05 DIAGNOSIS — G629 Polyneuropathy, unspecified: Secondary | ICD-10-CM

## 2015-08-05 DIAGNOSIS — L84 Corns and callosities: Secondary | ICD-10-CM

## 2015-08-05 NOTE — Progress Notes (Signed)
Measured for diabetic shoes and insoles. 

## 2015-08-06 ENCOUNTER — Telehealth: Payer: Self-pay | Admitting: *Deleted

## 2015-08-06 MED ORDER — PREGABALIN 50 MG PO CAPS: 50.0000 mg | ORAL_CAPSULE | Freq: Three times a day (TID) | ORAL | Status: DC

## 2015-08-06 NOTE — Telephone Encounter (Signed)
Refill request Lyrica 50 mg.  Dr. Jacqualyn Posey refilled for #90 +1refill. Orders faxed to [facility].

## 2015-08-07 ENCOUNTER — Other Ambulatory Visit: Payer: Self-pay | Admitting: Family Medicine

## 2015-08-11 ENCOUNTER — Other Ambulatory Visit: Payer: Self-pay | Admitting: Family Medicine

## 2015-08-17 ENCOUNTER — Other Ambulatory Visit: Payer: Self-pay | Admitting: Family Medicine

## 2015-08-17 NOTE — Telephone Encounter (Signed)
Last f/u 02/2015, but insomnia not discussed. pls advise

## 2015-08-17 NOTE — Telephone Encounter (Signed)
Rx faxed to requested pharmacy

## 2015-08-18 ENCOUNTER — Telehealth: Payer: Self-pay | Admitting: Family Medicine

## 2015-08-18 ENCOUNTER — Emergency Department
Admission: EM | Admit: 2015-08-18 | Discharge: 2015-08-18 | Disposition: A | Discharge status: Home / Self Care | Payer: Medicare Other | Attending: Emergency Medicine | Admitting: Emergency Medicine

## 2015-08-18 DIAGNOSIS — E11649 Type 2 diabetes mellitus with hypoglycemia without coma: Secondary | ICD-10-CM | POA: Insufficient documentation

## 2015-08-18 DIAGNOSIS — Z79899 Other long term (current) drug therapy: Secondary | ICD-10-CM | POA: Insufficient documentation

## 2015-08-18 DIAGNOSIS — Z7984 Long term (current) use of oral hypoglycemic drugs: Secondary | ICD-10-CM | POA: Insufficient documentation

## 2015-08-18 DIAGNOSIS — I10 Essential (primary) hypertension: Secondary | ICD-10-CM | POA: Diagnosis not present

## 2015-08-18 DIAGNOSIS — E162 Hypoglycemia, unspecified: Secondary | ICD-10-CM

## 2015-08-18 DIAGNOSIS — Z7951 Long term (current) use of inhaled steroids: Secondary | ICD-10-CM | POA: Diagnosis not present

## 2015-08-18 DIAGNOSIS — Z87891 Personal history of nicotine dependence: Secondary | ICD-10-CM | POA: Diagnosis not present

## 2015-08-18 DIAGNOSIS — E161 Other hypoglycemia: Secondary | ICD-10-CM | POA: Diagnosis not present

## 2015-08-18 LAB — TROPONIN I: Troponin I: <0.03 ng/mL (ref <0.031)

## 2015-08-18 LAB — CBC WITH DIFFERENTIAL/PLATELET
BASOS PCT: 1 %
Basophils Absolute: 0 10*3/uL (ref 0–0.1)
EOS ABS: 0 10*3/uL (ref 0–0.7)
Eosinophils Relative: 1 %
HEMATOCRIT: 35.5 % (ref 35.0–47.0)
HEMOGLOBIN: 11.3 g/dL — AB (ref 12.0–16.0)
LYMPHS ABS: 0.6 10*3/uL — AB (ref 1.0–3.6)
Lymphocytes Relative: 11 %
MCH: 26.8 pg (ref 26.0–34.0)
MCHC: 32 g/dL (ref 32.0–36.0)
MCV: 83.8 fL (ref 80.0–100.0)
Monocytes Absolute: 0.5 10*3/uL (ref 0.2–0.9)
Monocytes Relative: 9 %
NEUTROS ABS: 4.3 10*3/uL (ref 1.4–6.5)
NEUTROS PCT: 78 %
Platelets: 118 10*3/uL — ABNORMAL LOW (ref 150–440)
RBC: 4.23 MIL/uL (ref 3.80–5.20)
RDW: 17.2 % — ABNORMAL HIGH (ref 11.5–14.5)
WBC: 5.5 10*3/uL (ref 3.6–11.0)

## 2015-08-18 LAB — URINALYSIS COMPLETE WITH MICROSCOPIC (ARMC ONLY)
BACTERIA UA: NONE SEEN
Bilirubin Urine: NEGATIVE
Glucose, UA: NEGATIVE mg/dL
HGB URINE DIPSTICK: NEGATIVE
KETONES UR: NEGATIVE mg/dL
NITRITE: NEGATIVE
PH: 6 (ref 5.0–8.0)
PROTEIN: NEGATIVE mg/dL
SPECIFIC GRAVITY, URINE: 1.013 (ref 1.005–1.030)

## 2015-08-18 LAB — COMPREHENSIVE METABOLIC PANEL
ALBUMIN: 3.6 g/dL (ref 3.5–5.0)
ALK PHOS: 174 U/L — AB (ref 38–126)
ALT: 25 U/L (ref 14–54)
ANION GAP: 7 (ref 5–15)
AST: 54 U/L — ABNORMAL HIGH (ref 15–41)
BUN: 12 mg/dL (ref 6–20)
CALCIUM: 9 mg/dL (ref 8.9–10.3)
CO2: 25 mmol/L (ref 22–32)
Chloride: 107 mmol/L (ref 101–111)
Creatinine, Ser: 0.64 mg/dL (ref 0.44–1.00)
GFR calc non Af Amer: >60 mL/min (ref >60)
GLUCOSE: 77 mg/dL (ref 65–99)
POTASSIUM: 3.7 mmol/L (ref 3.5–5.1)
SODIUM: 139 mmol/L (ref 135–145)
Total Bilirubin: 0.6 mg/dL (ref 0.3–1.2)
Total Protein: 7.5 g/dL (ref 6.5–8.1)

## 2015-08-18 LAB — GLUCOSE, CAPILLARY
GLUCOSE-CAPILLARY: 77 mg/dL (ref 65–99)
Glucose-Capillary: 140 mg/dL — ABNORMAL HIGH (ref 65–99)

## 2015-08-18 MED ORDER — ONDANSETRON HCL 4 MG/2ML IJ SOLN
4.0000 mg | Freq: Once | INTRAMUSCULAR | Status: AC
  Administered 2015-08-18: 4 mg via INTRAVENOUS

## 2015-08-18 MED ORDER — ONDANSETRON HCL 4 MG/2ML IJ SOLN
INTRAMUSCULAR | Status: AC
Start: 2015-08-18 — End: 2015-08-18
  Administered 2015-08-18: 4 mg via INTRAVENOUS
  Filled 2015-08-18: qty 2

## 2015-08-18 NOTE — ED Notes (Signed)
Pt arrives via EMS from home.   States that she started feeling very weak and sick,   Glucose chesked by family at home - it was 38 and pt was given a few teaspoons of sugar and then as instructed by her PMD office pt was also given two table spoons of peanut butter.   CBG on EMS arrival was 89.   Here pts CBG is 77.

## 2015-08-18 NOTE — ED Provider Notes (Signed)
[facility] Emergency Department Provider Note   ____________________________________________  Time seen: Approximately 4:20pm I have reviewed the triage vital signs and the triage nursing note.  HISTORY  Chief Complaint Hypoglycemia   Historian Patient  HPI Stephanie [name] is a 65 y.o. female here with complaint of symptomatic hypoglycemia.  At home felt weak and dizzy and nauseated.  Only ate a pack of crackers for breakfast today.  Glucose today at home was in the 40s.  She took sugar and peanut butter.  With EMS fsbg in 80s, in 70s upon arrival to ED.  No recent illnesses, vomiting or diarrhea. No chest pain.    Past Medical History  Diagnosis Date  . Adenomatous colon polyp 08/27/2014    Polyps x 3  . Insomnia   . RLS (restless legs syndrome)   . Colon polyp   . Incontinence   . H/O: rheumatic fever   . Hyperlipidemia   . Unspecified disorders of nervous system   . Bipolar affective disorder ([place])     h/o  . H/O: CVA (cardiovascular accident)     TIA  . Fatty liver 04/09/08    found in abd CT  . Shortness of breath   . Obstructive sleep apnea     not using CPAP, last sleep study 2.5 years ago, per pt doctor is aware  . Blood transfusion 2000  . GERD (gastroesophageal reflux disease)   . Arthritis   . 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 Asthma   . Anxiety   . Depression   . Stroke [facility])     TIA- 2002  . Ulcer   . Diabetes mellitus without complication ([place])   . Anemia   . Blood transfusion without reported diagnosis   . Sleep apnea   . Platelets decreased ([place])   . 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 Chronic abdominal pain     abdominal wall pain    Patient Active Problem List   Diagnosis Date Noted  . Hyperglycemia 07/16/2015  . Neuropathy ([place]) 06/08/2015  . Thrombocytopenia ([place]) 08/19/2014  . Abdominal pain, chronic, epigastric 06/12/2014  . Liver cirrhosis secondary to NASH 02/27/2014  . Dizziness and  giddiness 10/30/2013  . DOE (dyspnea on exertion) 10/30/2013  . Poorly controlled type 2 diabetes mellitus with complication ([place]) 53/74/8270  . DNR (do not resuscitate) 07/29/2013  . Encounter for routine gynecological examination 07/29/2013  . Dyspnea 07/10/2013  . Lung nodule 07/10/2013  . Varicose veins of lower extremities with other complications 78/67/5449  . GERD (gastroesophageal reflux disease) 10/09/2012  . Cirrhosis of liver ([place]) 02/02/2012  . Anxiety 07/11/2011  . Aortic dissection ([place])   . BACK PAIN, LUMBAR, CHRONIC 11/19/2009  . UNS ADVRS EFF OTH RX MEDICINAL&BIOLOGICAL SBSTNC 12/16/2008  . UNSPECIFIED VITAMIN D DEFICIENCY 09/26/2008  . TOBACCO ABUSE 06/19/2008  . RESTLESS LEGS SYNDROME 04/27/2007  . INSOMNIA 04/27/2007  . HLD (hyperlipidemia) 03/08/2007  . OSA (obstructive sleep apnea) 03/08/2007  . Essential hypertension 03/08/2007  . Asthma 03/08/2007  . BIPOLAR AFFECTIVE DISORDER, HX OF 03/08/2007    Past Surgical History  Procedure Laterality Date  . Back surgery      x 45  . Cholecystectomy    . Rotator cuff repair      left  . Abdominal hysterectomy      total  . Lumbar fusion  10/09  . Hemorroidectomy      and colon polyp removed  .  Abdominal exploration surgery    . Posterior cervical fusion/foraminotomy    . Eye surgery      bilat  . Lumbar wound debridement  05/09/2011    Procedure: LUMBAR WOUND DEBRIDEMENT;  Surgeon: Dahlia Bailiff;  Location: [facility];  Service: Orthopedics;  Laterality: N/A;  IRRIGATION AND DEBRIDEMENT SPINAL WOUND  . Axillary artery cannulation via 8-mm hemashield graft, median sternotomy, extracorporeal circulation with deep hypothermic circulatory arrest, repair of  aortic dessection  01/23/2009    Dr Arlyce Dice  . Cataract      both eyes  . Colonoscopy    . Liver biopsy  04/10/2012    Procedure: LIVER BIOPSY;  Surgeon: Inda Castle, MD;  Location: [facility];  Service: Endoscopy;  Laterality: N/A;  ultrasound to  mark order for abd limited/liver to be marked to be sent by [email or web address]     Current Outpatient Rx  Name  Route  Sig  Dispense  Refill  . ARIPiprazole (ABILIFY) 20 MG tablet   Oral   Take 20 mg by mouth daily.          . budesonide-formoterol (SYMBICORT) 160-4.5 MCG/ACT inhaler   Inhalation   Inhale 2 puffs into the lungs 2 (two) times daily.         . clonazePAM (KLONOPIN) 0.5 MG tablet   Oral   Take 0.5 mg by mouth 2 (two) times daily as needed for anxiety.         . diazepam (VALIUM) 5 MG tablet   Oral   Take 5 mg by mouth 2 (two) times daily as needed for anxiety.         . dicyclomine (BENTYL) 10 MG capsule   Oral   Take 1 capsule (10 mg total) by mouth 3 (three) times daily before meals.   90 capsule   3   . docusate sodium (COLACE) 100 MG capsule   Oral   Take 100 mg by mouth 2 (two) times daily as needed for mild constipation.         Marland Kitchen glipiZIDE (GLUCOTROL) 5 MG tablet   Oral   Take 5 mg by mouth daily before breakfast.         . lamoTRIgine (LAMICTAL) 100 MG tablet   Oral   Take 100-200 mg by mouth 2 (two) times daily. Pt takes one tablet in the morning and two at bedtime.         . metFORMIN (GLUCOPHAGE) 500 MG tablet   Oral   Take 1,000 mg by mouth 2 (two) times daily with a meal.         . metoCLOPramide (REGLAN) 10 MG tablet   Oral   Take 1 tablet (10 mg total) by mouth 4 (four) times daily -  before meals and at bedtime.   60 tablet   0   . mirtazapine (REMERON) 15 MG tablet   Oral   Take 15 mg by mouth at bedtime.         Marland Kitchen nystatin (MYCOSTATIN/NYSTOP) 100000 UNIT/GM POWD   Topical   Apply 1 g topically 2 (two) times daily.         Marland Kitchen omeprazole (PRILOSEC) 20 MG capsule   Oral   Take 40 mg by mouth daily.         . ondansetron (ZOFRAN-ODT) 4 MG disintegrating tablet   Oral   Take 4 mg by mouth every 8 (eight) hours as needed for nausea or vomiting.         Marland Kitchen  pregabalin (LYRICA) 50 MG capsule   Oral   Take 1  capsule (50 mg total) by mouth 3 (three) times daily.   90 capsule   1   . rOPINIRole (REQUIP) 3 MG tablet   Oral   Take 3 mg by mouth at bedtime.         . simvastatin (ZOCOR) 10 MG tablet   Oral   Take 10 mg by mouth at bedtime.         . traMADol (ULTRAM) 50 MG tablet   Oral   Take 100 mg by mouth 2 (two) times daily.         Marland Kitchen zolpidem (AMBIEN) 5 MG tablet   Oral   Take 5 mg by mouth at bedtime as needed for sleep.         Marland Kitchen tolterodine (DETROL LA) 2 MG 24 hr capsule   Oral   Take 1 capsule (2 mg total) by mouth daily. Patient not taking: Reported on 08/18/2015   90 capsule   2     Allergies Doxycycline  Family History  Problem Relation Age of Onset  . Colon cancer Neg Hx   . Esophageal cancer Neg Hx   . Rectal cancer Neg Hx   . Breast cancer Mother   . Lung cancer Mother   . Stomach cancer Father   . Diabetes      4 aunts, and 1 uncle    Social History Social History  Substance Use Topics  . Smoking status: Former Smoker -- 1.50 packs/day for 50 years    Types: Cigarettes    Quit date: 04/06/2013  . Smokeless tobacco: Never Used  . Alcohol Use: No    Review of Systems  Constitutional: Negative for fever. Eyes: Negative for visual changes. ENT: Negative for sore throat. Cardiovascular: Negative for chest pain. Respiratory: Negative for shortness of breath. Gastrointestinal: Negative for abdominal pain, vomiting and diarrhea. Genitourinary: Negative for dysuria. Musculoskeletal: Negative for back pain. Skin: Negative for rash. Neurological: Negative for headache. 10 point Review of Systems otherwise negative ____________________________________________   PHYSICAL EXAM:  VITAL SIGNS: ED Triage Vitals  Enc Vitals Group     BP --      Pulse --      Resp --      Temp --      Temp src --      SpO2 --      Weight --      Height --      Head Cir --      Peak Flow --      Pain Score --      Pain Loc --      Pain Edu? --       Excl. in [place]? --      Constitutional: Alert and oriented. Well appearing and in no distress. HEENT   Head: Normocephalic and atraumatic.      Eyes: Conjunctivae are normal. PERRL. Normal extraocular movements.      Ears:         Nose: No congestion/rhinnorhea.   Mouth/Throat: Mucous membranes are moist.   Neck: No stridor. Cardiovascular/Chest: Normal rate, regular rhythm.  No murmurs, rubs, or gallops. Respiratory: Normal respiratory effort without tachypnea nor retractions. Breath sounds are clear and equal bilaterally. No wheezes/rales/rhonchi. Gastrointestinal: Soft. No distention, no guarding, no rebound. Nontender.    Genitourinary/rectal:Deferred Musculoskeletal: Nontender with normal range of motion in all extremities. No joint effusions.  No lower extremity tenderness.  No  edema. Neurologic:  Normal speech and language. No gross or focal neurologic deficits are appreciated. Skin:  Skin is warm, dry and intact. No rash noted. Psychiatric: Mood and affect are normal. Speech and behavior are normal. Patient exhibits appropriate insight and judgment.  ____________________________________________   EKG I, Lisa Roca, MD, the attending physician have personally viewed and interpreted all ECGs.   91 bpm. Normal sinus rhythm. Nonspecific intraventricular conduction delay. Normal axis. Normal ST and T-wave ____________________________________________  LABS (pertinent positives/negatives)  Urinalysis 1+ leukocytes and otherwise without significant abnormality Comprehensive metabolic panel without significant abnormalities Troponin less than 0.03 White blood count 5.5, hemoglobin 11.3 and platelet count 118   ____________________________________________  RADIOLOGY All Xrays were viewed by me. Imaging interpreted by Radiologist.  None __________________________________________  PROCEDURES  Procedure(s) performed: None  Critical Care performed:  None  ____________________________________________   ED COURSE / ASSESSMENT AND PLAN  Pertinent labs & imaging results that were available during my care of the patient were reviewed by me and considered in my medical decision making (see chart for details).    I suspect patient became hypoglycemic because of long duration between a pack a crackers this morning and this afternoon. She is on two oral hypoglycemic agents.  She was given food to eat has been stable with no recurrent hypoglycemia here in the emergency department.  She was able take by mouth. We discussed she needs to take more frequent meals throughout the day to prevent hypoglycemic episodes.   CONSULTATIONS:   None   Patient / Family / Caregiver informed of clinical course, medical decision-making process, and agree with plan.   I discussed return precautions, follow-up instructions, and discharged instructions with patient and/or family.   ___________________________________________   FINAL CLINICAL IMPRESSION(S) / ED DIAGNOSES   Final diagnoses:  Hypoglycemia              Note: This dictation was prepared with Dragon dictation. Any transcriptional errors that result from this process are unintentional   Lisa Roca, MD 08/18/15 1935

## 2015-08-18 NOTE — Discharge Instructions (Signed)
Your evaluation after finding a low blood sugar, and I suspect this is due to not eating for long period of time while on blood sugar lowering medications. You need to be eating a several hours to make sure that you don't have a problem with low blood sugar episode.  Return to emergency department for any worsening condition including any chest pain, vomiting, abdominal pain, dizziness, altered mental status, or low blood sugar such as less than 70 especially with any symptoms.   Hypoglycemia Hypoglycemia occurs when the glucose in your blood is too low. Glucose is a type of sugar that is your body's main energy source. Hormones, such as insulin and glucagon, control the level of glucose in the blood. Insulin lowers blood glucose and glucagon increases blood glucose. Having too much insulin in your blood stream, or not eating enough food containing sugar, can result in hypoglycemia. Hypoglycemia can happen to people with or without diabetes. It can develop quickly and can be a medical emergency.  CAUSES   Missing or delaying meals.  Not eating enough carbohydrates at meals.  Taking too much diabetes medicine.  Not timing your oral diabetes medicine or insulin doses with meals, snacks, and exercise.  Nausea and vomiting.  Certain medicines.  Severe illnesses, such as hepatitis, kidney disorders, and certain eating disorders.  Increased activity or exercise without eating something extra or adjusting medicines.  Drinking too much alcohol.  A nerve disorder that affects body functions like your heart rate, blood pressure, and digestion (autonomic neuropathy).  A condition where the stomach muscles do not function properly (gastroparesis). Therefore, medicines and food may not absorb properly.  Rarely, a tumor of the pancreas can produce too much insulin. SYMPTOMS   Hunger.  Sweating (diaphoresis).  Change in body  temperature.  Shakiness.  Headache.  Anxiety.  Lightheadedness.  Irritability.  Difficulty concentrating.  Dry mouth.  Tingling or numbness in the hands or feet.  Restless sleep or sleep disturbances.  Altered speech and coordination.  Change in mental status.  Seizures or prolonged convulsions.  Combativeness.  Drowsiness (lethargic).  Weakness.  Increased heart rate or palpitations.  Confusion.  Pale, gray skin color.  Blurred or double vision.  Fainting. DIAGNOSIS  A physical exam and medical history will be performed. Your caregiver may make a diagnosis based on your symptoms. Blood tests and other lab tests may be performed to confirm a diagnosis. Once the diagnosis is made, your caregiver will see if your signs and symptoms go away once your blood glucose is raised.  TREATMENT  Usually, you can easily treat your hypoglycemia when you notice symptoms.  Check your blood glucose. If it is less than 70 mg/dl, take one of the following:   3-4 glucose tablets.    cup juice.    cup regular soda.   1 cup skim milk.   -1 tube of glucose gel.   5-6 hard candies.   Avoid high-fat drinks or food that may delay a rise in blood glucose levels.  Do not take more than the recommended amount of sugary foods, drinks, gel, or tablets. Doing so will cause your blood glucose to go too high.   Wait 10-15 minutes and recheck your blood glucose. If it is still less than 70 mg/dl or below your target range, repeat treatment.   Eat a snack if it is more than 1 hour until your next meal.  There may be a time when your blood glucose may go so low that you  are unable to treat yourself at home when you start to notice symptoms. You may need someone to help you. You may even faint or be unable to swallow. If you cannot treat yourself, someone will need to bring you to the hospital.  [place]  If you have diabetes, follow your diabetes management  plan by:  Taking your medicines as directed.  Following your exercise plan.  Following your meal plan. Do not skip meals. Eat on time.  Testing your blood glucose regularly. Check your blood glucose before and after exercise. If you exercise longer or different than usual, be sure to check blood glucose more frequently.  Wearing your medical alert jewelry that says you have diabetes.  Identify the cause of your hypoglycemia. Then, develop ways to prevent the recurrence of hypoglycemia.  Do not take a hot bath or shower right after an insulin shot.  Always carry treatment with you. Glucose tablets are the easiest to carry.  If you are going to drink alcohol, drink it only with meals.  Tell friends or family members ways to keep you safe during a seizure. This may include removing hard or sharp objects from the area or turning you on your side.  Maintain a healthy weight. SEEK MEDICAL CARE IF:   You are having problems keeping your blood glucose in your target range.  You are having frequent episodes of hypoglycemia.  You feel you might be having side effects from your medicines.  You are not sure why your blood glucose is dropping so low.  You notice a change in vision or a new problem with your vision. SEEK IMMEDIATE MEDICAL CARE IF:   Confusion develops.  A change in mental status occurs.  The inability to swallow develops.  Fainting occurs.   This information is not intended to replace advice given to you by your health care provider. Make sure you discuss any questions you have with your health care provider.   Document Released: 05/23/2005 Document Revised: 05/28/2013 Document Reviewed: 01/27/2015 Elsevier Interactive Patient Education [employer].

## 2015-08-18 NOTE — ED Notes (Signed)
Pt has finished eating meal at this time, states she is feeling better. Pt states she is no longer nauseous.

## 2015-08-18 NOTE — Telephone Encounter (Signed)
Per chart review pt is in [facility] ED.

## 2015-08-18 NOTE — Telephone Encounter (Signed)
°  Patient Name: Stephanie [name]  DOB: Jan 21, 1951    Initial Comment Caller States her BS 34, she is weak and nervous.    Nurse Assessment  Nurse: Julien Girt, RN, Almyra Free Date/Time Eilene Ghazi Time): 08/18/2015 2:55:00 PM  Confirm and document reason for call. If symptomatic, describe symptoms. You must click the next button to save text entered. ---Caller states her BS is 4, she is weak and nervous. States she is very lightheaded . She does have a friend present in her home.  Has the patient traveled out of the country within the last 30 days? ---Not Applicable  Does the patient have any new or worsening symptoms? ---Yes  Will a triage be completed? ---Yes  Related visit to physician within the last 2 weeks? ---[name]  Does the PT have any chronic conditions? (i.e. diabetes, asthma, etc.) ---Yes  List chronic conditions. ---Diabetes( Metformin) Cirrhosis  Is this a behavioral health or substance abuse call? ---[name]     Guidelines    Guideline Title Affirmed Question Affirmed Notes  Diabetes - Low Blood Sugar Very weak (e.g., can't stand)    Final Disposition User   Call EMS 911 Now Julien Girt, RN, Almyra Free    Comments  Bari rechecked BS, it is 79 after 3 tsps of sugar. She is still clammy, weak and light headed. Advised to call 911 and I will cb. Verbalized understanding.   Disagree/Comply: Comply

## 2015-08-19 ENCOUNTER — Other Ambulatory Visit: Payer: Self-pay | Admitting: Family Medicine

## 2015-08-19 DIAGNOSIS — M539 Dorsopathy, unspecified: Secondary | ICD-10-CM | POA: Diagnosis not present

## 2015-08-19 DIAGNOSIS — G4733 Obstructive sleep apnea (adult) (pediatric): Secondary | ICD-10-CM | POA: Diagnosis not present

## 2015-08-19 NOTE — Telephone Encounter (Signed)
Rx called in to requested pharmacy 

## 2015-08-25 ENCOUNTER — Ambulatory Visit: Payer: Medicare Other | Admitting: Family Medicine

## 2015-08-25 ENCOUNTER — Ambulatory Visit: Payer: Commercial Managed Care - HMO | Admitting: Podiatry

## 2015-09-01 ENCOUNTER — Emergency Department
Admission: EM | Admit: 2015-09-01 | Discharge: 2015-09-01 | Disposition: A | Discharge status: Home / Self Care | Payer: Medicare Other | Attending: Student | Admitting: Student

## 2015-09-01 ENCOUNTER — Encounter: Payer: Self-pay | Admitting: *Deleted

## 2015-09-01 ENCOUNTER — Emergency Department: Payer: Medicare Other

## 2015-09-01 ENCOUNTER — Other Ambulatory Visit: Payer: Self-pay | Admitting: Family Medicine

## 2015-09-01 ENCOUNTER — Telehealth: Payer: Self-pay | Admitting: Family Medicine

## 2015-09-01 DIAGNOSIS — M542 Cervicalgia: Secondary | ICD-10-CM | POA: Diagnosis not present

## 2015-09-01 DIAGNOSIS — S301XXA Contusion of abdominal wall, initial encounter: Secondary | ICD-10-CM | POA: Diagnosis not present

## 2015-09-01 DIAGNOSIS — D696 Thrombocytopenia, unspecified: Secondary | ICD-10-CM

## 2015-09-01 DIAGNOSIS — Y92512 Supermarket, store or market as the place of occurrence of the external cause: Secondary | ICD-10-CM | POA: Insufficient documentation

## 2015-09-01 DIAGNOSIS — W010XXA Fall on same level from slipping, tripping and stumbling without subsequent striking against object, initial encounter: Secondary | ICD-10-CM | POA: Diagnosis not present

## 2015-09-01 DIAGNOSIS — Y998 Other external cause status: Secondary | ICD-10-CM | POA: Diagnosis not present

## 2015-09-01 DIAGNOSIS — E785 Hyperlipidemia, unspecified: Secondary | ICD-10-CM

## 2015-09-01 DIAGNOSIS — Z7984 Long term (current) use of oral hypoglycemic drugs: Secondary | ICD-10-CM | POA: Insufficient documentation

## 2015-09-01 DIAGNOSIS — T148 Other injury of unspecified body region: Secondary | ICD-10-CM | POA: Diagnosis not present

## 2015-09-01 DIAGNOSIS — S199XXA Unspecified injury of neck, initial encounter: Secondary | ICD-10-CM | POA: Insufficient documentation

## 2015-09-01 DIAGNOSIS — S49091A Other physeal fracture of upper end of humerus, right arm, initial encounter for closed fracture: Secondary | ICD-10-CM | POA: Diagnosis not present

## 2015-09-01 DIAGNOSIS — Y9301 Activity, walking, marching and hiking: Secondary | ICD-10-CM | POA: Insufficient documentation

## 2015-09-01 DIAGNOSIS — S3991XA Unspecified injury of abdomen, initial encounter: Secondary | ICD-10-CM | POA: Diagnosis not present

## 2015-09-01 DIAGNOSIS — Z7951 Long term (current) use of inhaled steroids: Secondary | ICD-10-CM | POA: Insufficient documentation

## 2015-09-01 DIAGNOSIS — I10 Essential (primary) hypertension: Secondary | ICD-10-CM

## 2015-09-01 DIAGNOSIS — M25511 Pain in right shoulder: Secondary | ICD-10-CM | POA: Diagnosis not present

## 2015-09-01 DIAGNOSIS — E119 Type 2 diabetes mellitus without complications: Secondary | ICD-10-CM | POA: Insufficient documentation

## 2015-09-01 DIAGNOSIS — G2581 Restless legs syndrome: Secondary | ICD-10-CM

## 2015-09-01 DIAGNOSIS — S42201A Unspecified fracture of upper end of right humerus, initial encounter for closed fracture: Secondary | ICD-10-CM | POA: Insufficient documentation

## 2015-09-01 DIAGNOSIS — Z87891 Personal history of nicotine dependence: Secondary | ICD-10-CM | POA: Diagnosis not present

## 2015-09-01 DIAGNOSIS — Z79899 Other long term (current) drug therapy: Secondary | ICD-10-CM | POA: Diagnosis not present

## 2015-09-01 DIAGNOSIS — S298XXA Other specified injuries of thorax, initial encounter: Secondary | ICD-10-CM | POA: Diagnosis not present

## 2015-09-01 DIAGNOSIS — M79601 Pain in right arm: Secondary | ICD-10-CM | POA: Diagnosis not present

## 2015-09-01 DIAGNOSIS — S4991XA Unspecified injury of right shoulder and upper arm, initial encounter: Secondary | ICD-10-CM | POA: Diagnosis present

## 2015-09-01 DIAGNOSIS — W19XXXA Unspecified fall, initial encounter: Secondary | ICD-10-CM

## 2015-09-01 LAB — CBC WITH DIFFERENTIAL/PLATELET
Basophils Absolute: 0 10*3/uL (ref 0–0.1)
Basophils Relative: 0 %
Eosinophils Absolute: 0.1 10*3/uL (ref 0–0.7)
Eosinophils Relative: 1 %
HEMATOCRIT: 33.9 % — AB (ref 35.0–47.0)
HEMOGLOBIN: 10.8 g/dL — AB (ref 12.0–16.0)
LYMPHS ABS: 0.9 10*3/uL — AB (ref 1.0–3.6)
LYMPHS PCT: 17 %
MCH: 26.9 pg (ref 26.0–34.0)
MCHC: 31.9 g/dL — AB (ref 32.0–36.0)
MCV: 84.2 fL (ref 80.0–100.0)
MONOS PCT: 9 %
Monocytes Absolute: 0.5 10*3/uL (ref 0.2–0.9)
NEUTROS PCT: 73 %
Neutro Abs: 3.9 10*3/uL (ref 1.4–6.5)
Platelets: 110 10*3/uL — ABNORMAL LOW (ref 150–440)
RBC: 4.02 MIL/uL (ref 3.80–5.20)
RDW: 18.2 % — ABNORMAL HIGH (ref 11.5–14.5)
WBC: 5.3 10*3/uL (ref 3.6–11.0)

## 2015-09-01 LAB — BASIC METABOLIC PANEL
Anion gap: 8 (ref 5–15)
BUN: 10 mg/dL (ref 6–20)
CALCIUM: 8.5 mg/dL — AB (ref 8.9–10.3)
CHLORIDE: 112 mmol/L — AB (ref 101–111)
CO2: 18 mmol/L — AB (ref 22–32)
Creatinine, Ser: 0.7 mg/dL (ref 0.44–1.00)
GFR calc Af Amer: >60 mL/min (ref >60)
GLUCOSE: 136 mg/dL — AB (ref 65–99)
POTASSIUM: 3.8 mmol/L (ref 3.5–5.1)
Sodium: 138 mmol/L (ref 135–145)

## 2015-09-01 MED ORDER — MORPHINE SULFATE (PF) 4 MG/ML IV SOLN
4.0000 mg | Freq: Once | INTRAVENOUS | Status: DC
  Filled 2015-09-01: qty 1

## 2015-09-01 MED ORDER — FENTANYL CITRATE (PF) 100 MCG/2ML IJ SOLN
INTRAMUSCULAR | Status: AC
  Administered 2015-09-01: 50 ug via INTRAVENOUS
  Filled 2015-09-01: qty 2

## 2015-09-01 MED ORDER — SODIUM CHLORIDE 0.9 % IV BOLUS (SEPSIS)
500.0000 mL | Freq: Once | INTRAVENOUS | Status: AC
  Administered 2015-09-01: 500 mL via INTRAVENOUS

## 2015-09-01 MED ORDER — ONDANSETRON HCL 4 MG/2ML IJ SOLN
4.0000 mg | Freq: Once | INTRAMUSCULAR | Status: AC
  Administered 2015-09-01: 4 mg via INTRAVENOUS
  Filled 2015-09-01: qty 2

## 2015-09-01 MED ORDER — OXYCODONE HCL 5 MG PO TABS: 5.0000 mg | ORAL_TABLET | Freq: Four times a day (QID) | ORAL | Status: DC | PRN

## 2015-09-01 MED ORDER — FENTANYL CITRATE (PF) 100 MCG/2ML IJ SOLN
50.0000 ug | Freq: Once | INTRAMUSCULAR | Status: AC
  Administered 2015-09-01: 50 ug via INTRAVENOUS

## 2015-09-01 MED ORDER — IOPAMIDOL (ISOVUE-300) INJECTION 61%
100.0000 mL | Freq: Once | INTRAVENOUS | Status: AC | PRN
  Administered 2015-09-01: 100 mL via INTRAVENOUS

## 2015-09-01 MED ORDER — MORPHINE SULFATE (PF) 4 MG/ML IV SOLN
4.0000 mg | Freq: Once | INTRAVENOUS | Status: AC
  Administered 2015-09-01: 4 mg via INTRAVENOUS
  Filled 2015-09-01: qty 1

## 2015-09-01 NOTE — ED Notes (Signed)
Patient transported to CT 

## 2015-09-01 NOTE — ED Notes (Signed)
Pt arrives via EMS from [facility] with a fall, pt denies LOC or hitting or head but states she tripped over a cart, pt not on blood thinners, pt awake and alert, EMS gave 50 mcg of fentanyl, pt arrives with right arm in sling, states right arm pain down to her elbow, states neck soreness but will not tolerate c-collar, pt hyperventilating and anxious, moaning upon arrival

## 2015-09-01 NOTE — ED Notes (Signed)
Pt resting in bed, eyes closed, resp even and unlabored, pt in no acute distress

## 2015-09-01 NOTE — Telephone Encounter (Signed)
Pt called to cancel appointment for tomorrow.  She is at [facility] with a broken shoulder

## 2015-09-01 NOTE — ED Notes (Signed)
500 cc fluid bolus given per MD

## 2015-09-01 NOTE — ED Provider Notes (Signed)
[facility] Emergency Department Provider Note  ____________________________________________  Time seen: Approximately 12:05 PM  I have reviewed the triage vital signs and the nursing notes.   HISTORY  Chief Complaint Fall and Arm Pain    HPI IRMA ROULHAC is a 65 y.o. female with hyperlipidemia, diabetes, bipolar disorder, GERD, fibromyalgia who presents for evaluation of traumatic right shoulder and neck pain which began suddenly just prior to arrival when she tripped and fell at the grocery store constant since onset, severe and worse with movement. Patient reports that she was at [employer], she was walking and there was a car in front of her but she didn't see it and she fell over the cart. She did not hit her head or lose consciousness. She is complaining of severe pain in the right shoulder. No chest pain or difficulty breathing. No nausea or vomiting. No numbness or weakness in the limbs.   Past Medical History  Diagnosis Date  . Adenomatous colon polyp 08/27/2014    Polyps x 3  . Insomnia   . RLS (restless legs syndrome)   . Colon polyp   . Incontinence   . H/O: rheumatic fever   . Hyperlipidemia   . Unspecified disorders of nervous system   . Bipolar affective disorder ([place])     h/o  . H/O: CVA (cardiovascular accident)     TIA  . Fatty liver 04/09/08    found in abd CT  . Shortness of breath   . Obstructive sleep apnea     not using CPAP, last sleep study 2.5 years ago, per pt doctor is aware  . Blood transfusion 2000  . GERD (gastroesophageal reflux disease)   . Arthritis   . 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 Asthma   . Anxiety   . Depression   . Stroke [facility])     TIA- 2002  . Ulcer   . Diabetes mellitus without complication ([place])   . Anemia   . Blood transfusion without reported diagnosis   . Sleep apnea   . Platelets decreased ([place])   . 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 Chronic abdominal pain      abdominal wall pain    Patient Active Problem List   Diagnosis Date Noted  . Hyperglycemia 07/16/2015  . Neuropathy ([place]) 06/08/2015  . Thrombocytopenia ([place]) 08/19/2014  . Abdominal pain, chronic, epigastric 06/12/2014  . Liver cirrhosis secondary to NASH 02/27/2014  . Dizziness and giddiness 10/30/2013  . DOE (dyspnea on exertion) 10/30/2013  . Poorly controlled type 2 diabetes mellitus with complication ([place]) 11/94/1740  . DNR (do not resuscitate) 07/29/2013  . Encounter for routine gynecological examination 07/29/2013  . Dyspnea 07/10/2013  . Lung nodule 07/10/2013  . Varicose veins of lower extremities with other complications 81/44/8185  . GERD (gastroesophageal reflux disease) 10/09/2012  . Cirrhosis of liver ([place]) 02/02/2012  . Anxiety 07/11/2011  . Aortic dissection ([place])   . BACK PAIN, LUMBAR, CHRONIC 11/19/2009  . UNS ADVRS EFF OTH RX MEDICINAL&BIOLOGICAL SBSTNC 12/16/2008  . UNSPECIFIED VITAMIN D DEFICIENCY 09/26/2008  . TOBACCO ABUSE 06/19/2008  . RESTLESS LEGS SYNDROME 04/27/2007  . INSOMNIA 04/27/2007  . HLD (hyperlipidemia) 03/08/2007  . OSA (obstructive sleep apnea) 03/08/2007  . Essential hypertension 03/08/2007  . Asthma 03/08/2007  . BIPOLAR AFFECTIVE DISORDER, HX OF 03/08/2007    Past Surgical History  Procedure Laterality Date  . Back surgery  x 45  . Cholecystectomy    . Rotator cuff repair      left  . Abdominal hysterectomy      total  . Lumbar fusion  10/09  . Hemorroidectomy      and colon polyp removed  . Abdominal exploration surgery    . Posterior cervical fusion/foraminotomy    . Eye surgery      bilat  . Lumbar wound debridement  05/09/2011    Procedure: LUMBAR WOUND DEBRIDEMENT;  Surgeon: Dahlia Bailiff;  Location: [place];  Service: Orthopedics;  Laterality: N/A;  IRRIGATION AND DEBRIDEMENT SPINAL WOUND  . Axillary artery cannulation via 8-mm hemashield graft, median sternotomy, extracorporeal circulation with deep hypothermic  circulatory arrest, repair of  aortic dessection  01/23/2009    Dr Arlyce Dice  . Cataract      both eyes  . Colonoscopy    . Liver biopsy  04/10/2012    Procedure: LIVER BIOPSY;  Surgeon: Inda Castle, MD;  Location: [facility];  Service: Endoscopy;  Laterality: N/A;  ultrasound to mark order for abd limited/liver to be marked to be sent by [email or web address]     Current Outpatient Rx  Name  Route  Sig  Dispense  Refill  . ARIPiprazole (ABILIFY) 20 MG tablet   Oral   Take 20 mg by mouth daily.          . budesonide-formoterol (SYMBICORT) 160-4.5 MCG/ACT inhaler   Inhalation   Inhale 2 puffs into the lungs 2 (two) times daily.         . clonazePAM (KLONOPIN) 0.5 MG tablet   Oral   Take 0.5 mg by mouth 2 (two) times daily as needed for anxiety.         . diazepam (VALIUM) 5 MG tablet   Oral   Take 5 mg by mouth 2 (two) times daily as needed for anxiety.         . dicyclomine (BENTYL) 10 MG capsule   Oral   Take 1 capsule (10 mg total) by mouth 3 (three) times daily before meals.   90 capsule   3   . docusate sodium (COLACE) 100 MG capsule   Oral   Take 100 mg by mouth 2 (two) times daily as needed for mild constipation.         Marland Kitchen glipiZIDE (GLUCOTROL) 5 MG tablet   Oral   Take 5 mg by mouth daily before breakfast.         . lamoTRIgine (LAMICTAL) 100 MG tablet   Oral   Take 100-200 mg by mouth 2 (two) times daily. Pt takes one tablet in the morning and two at bedtime.         . metFORMIN (GLUCOPHAGE) 500 MG tablet   Oral   Take 1,000 mg by mouth 2 (two) times daily with a meal.         . metoCLOPramide (REGLAN) 10 MG tablet   Oral   Take 1 tablet (10 mg total) by mouth 4 (four) times daily -  before meals and at bedtime.   60 tablet   0   . mirtazapine (REMERON) 15 MG tablet   Oral   Take 15 mg by mouth at bedtime.         Marland Kitchen nystatin (MYCOSTATIN/NYSTOP) 100000 UNIT/GM POWD   Topical   Apply 1 g topically 2 (two) times daily.         Marland Kitchen  omeprazole (PRILOSEC) 20 MG capsule   Oral  Take 40 mg by mouth daily.         . ondansetron (ZOFRAN-ODT) 4 MG disintegrating tablet   Oral   Take 4 mg by mouth every 8 (eight) hours as needed for nausea or vomiting.         Marland Kitchen oxyCODONE (ROXICODONE) 5 MG immediate release tablet   Oral   Take 1 tablet (5 mg total) by mouth every 6 (six) hours as needed for moderate pain. Do not drive while taking this medication.   12 tablet   0   . pregabalin (LYRICA) 50 MG capsule   Oral   Take 1 capsule (50 mg total) by mouth 3 (three) times daily.   90 capsule   1   . rOPINIRole (REQUIP) 3 MG tablet   Oral   Take 3 mg by mouth at bedtime.         . simvastatin (ZOCOR) 10 MG tablet   Oral   Take 10 mg by mouth at bedtime.         . tolterodine (DETROL LA) 2 MG 24 hr capsule   Oral   Take 1 capsule (2 mg total) by mouth daily. Patient not taking: Reported on 08/18/2015   90 capsule   2   . traMADol (ULTRAM) 50 MG tablet      TAKE 2 TABLETS BY MOUTH IN THE MORNING AND 2 TABLETS IN THE EVENING   120 tablet   0     Not to exceed 5 additional fills before 01/17/2016 ...   . zolpidem (AMBIEN) 5 MG tablet   Oral   Take 5 mg by mouth at bedtime as needed for sleep.           Allergies Morphine and related and Doxycycline  Family History  Problem Relation Age of Onset  . Colon cancer Neg Hx   . Esophageal cancer Neg Hx   . Rectal cancer Neg Hx   . Breast cancer Mother   . Lung cancer Mother   . Stomach cancer Father   . Diabetes      4 aunts, and 1 uncle    Social History Social History  Substance Use Topics  . Smoking status: Former Smoker -- 1.50 packs/day for 50 years    Types: Cigarettes    Quit date: 04/06/2013  . Smokeless tobacco: Never Used  . Alcohol Use: No    Review of Systems Constitutional: No fever/chills Eyes: No visual changes. ENT: No sore throat. Cardiovascular: Denies chest pain. Respiratory: Denies shortness of  breath. Gastrointestinal: No abdominal pain.  No nausea, no vomiting.  No diarrhea.  No constipation. Genitourinary: Negative for dysuria. Musculoskeletal: Negative for back pain. Skin: Negative for rash. Neurological: Negative for headaches, focal weakness or numbness.  10-point ROS otherwise negative.  ____________________________________________   PHYSICAL EXAM:  VITAL SIGNS: ED Triage Vitals  Enc Vitals Group     BP 09/01/15 1146 116/62 mmHg     Pulse Rate 09/01/15 1146 79     Resp 09/01/15 1146 26     Temp 09/01/15 1146 97.6 F (36.4 C)     Temp Source 09/01/15 1146 Oral     SpO2 --      Weight 09/01/15 1146 210 lb (95.255 kg)     Height 09/01/15 1146 5' 3"  (1.6 m)     Head Cir --      Peak Flow --      Pain Score 09/01/15 1147 8     Pain Loc --  Pain Edu? --      Excl. in [place]? --     Constitutional: Alert and oriented. In Distress secondary to pain, tearful. Eyes: Conjunctivae are normal. PERRL. EOMI. Head: Atraumatic. Nose: No congestion/rhinnorhea. Mouth/Throat: Mucous membranes are moist.  Oropharynx non-erythematous. Neck: No stridor. No cervical spine tenderness to palpation. Cardiovascular: Normal rate, regular rhythm. Grossly normal heart sounds.  Good peripheral circulation. Respiratory: Normal respiratory effort.  No retractions. Lungs CTAB. Gastrointestinal: Soft with small areas of ecchymosis all throughout the abdomen, mild tenderness in the abdomen. No CVA tenderness. Genitourinary: Deferred Musculoskeletal: Tenderness worse throughout the right shoulder and upper arm, full extension and flexion at the elbow, 2+ right radial pulse and right radial, ulnar and median nerve are intact in the right arm but patient screams out in pain with any attempted manipulation of the right shoulder. Pelvis stable to rock and compression. Full painless active range of motion of bilateral hip joints. No midline T or L spine tenderness to palpation. Neurologic:  Normal  speech and language. No gross focal neurologic deficits are appreciated.  Skin:  Skin is warm, dry and intact. No rash noted. Psychiatric: Mood and affect are normal. Speech and behavior are normal.  ____________________________________________   LABS (all labs ordered are listed, but only abnormal results are displayed)  Labs Reviewed  CBC WITH DIFFERENTIAL/PLATELET - Abnormal; Notable for the following:    Hemoglobin 10.8 (*)    HCT 33.9 (*)    MCHC 31.9 (*)    RDW 18.2 (*)    Platelets 110 (*)    Lymphs Abs 0.9 (*)    All other components within normal limits  BASIC METABOLIC PANEL - Abnormal; Notable for the following:    Chloride 112 (*)    CO2 18 (*)    Glucose, Bld 136 (*)    Calcium 8.5 (*)    All other components within normal limits   ____________________________________________  EKG  ED ECG REPORT I, Joanne Gavel, the attending physician, personally viewed and interpreted this ECG.   Date: 09/01/2015  EKG Time: 11:48  Rate: 85  Rhythm: normal sinus rhythm  Axis: borderline right axis  Intervals:none  ST&T Change: No acute ST elevation.  ____________________________________________  RADIOLOGY  CT c-spine IMPRESSION: Multilevel degenerative changes and postoperative changes. No acute bony abnormality is noted.  Xray right shoulder IMPRESSION: Probable subtle nondisplaced fracture of greater humeral tuberosity. Degenerative changes AC joint.  Xray right humerus IMPRESSION: Impacted fracture proximal humeral metaphysis with alignment near anatomic. No other fracture. No dislocation. Calcification is noted in the superior aspect of the right acromioclavicular joint.  CT chest, abdomen and pelvis  IMPRESSION: 1. No acute injury involving the chest, abdomen or pelvis. 2. Mild emphysematous changes in the lungs and patchy airspace nodules. This could reflect inflammatory change but recommend followup noncontrast chest CT in 3 months. 3. Stable  cirrhotic changes involving the liver with portal venous hypertension and portal venous collaterals. Stable splenomegaly. 4. No acute bony findings. ____________________________________________   PROCEDURES  Procedure(s) performed: None  Critical Care performed: No  ____________________________________________   INITIAL IMPRESSION / ASSESSMENT AND PLAN / ED COURSE  Pertinent labs & imaging results that were available during my care of the patient were reviewed by me and considered in my medical decision making (see chart for details).  CHERLYN SYRING is a 65 y.o. female with hyperlipidemia, diabetes, bipolar disorder, GERD, fibromyalgia who presents for evaluation of traumatic right shoulder and neck pain which began suddenly just prior  to arrival when she tripped and fell at the grocery store. On exam, she is tearful, in distress secondary to pain. Her most salient pain complaints are in the right arm/shoulder as well as the right neck however she cries out in pain when I touch almost any part of her body and the accuracy of the examination is limited. We'll treat her pain and reassess. She also has areas of small ecchymosis thoughout the abdomen with some tenderness but she reports that these areas of ecchymosis are old and occurred when she had a fall on the 14th of this month however they appear new to me. if her abdominal pain does not resolve completely, we will obtain CT scan of the abdomen and pelvis. We'll treat her pain and reassess for disposition.  ----------------------------------------- 4:02 PM on 09/01/2015 ----------------------------------------- Labs reviewed. CBC notable for anemia, hemoglobin 10.8 however this appears to be near the patient's baseline. Unemarkable BMP. CT C-spine, chest abdomen pelvis negative for any acute traumatic pathology. Plain filmsof the right arm are consistent with proximal humerus fracture. She remains neurovascularly intact in the right  arm. I discussed the case with Dr. Mack Guise of orthopedic surgery he recommends sling and outpatient follow-up. Discussed return precautions with the patient, need for close orthopedics follow-up and she is comfortable with the discharge plan. DC home.  ----------------------------------------- 5:27 PM on 09/01/2015 ----------------------------------------- I attempted to discharge the patient however became hypotensive, likely secondary to morphine administration. She received IV fluids and her blood pressure has improved to 106/72 which she reports is a normal blood pressure for her. She feels well. She will not be discharged with oxycodone. She has Ultram at home which she takes regularly and she reports she does not need another prescription.  ____________________________________________   FINAL CLINICAL IMPRESSION(S) / ED DIAGNOSES  Final diagnoses:  Proximal humerus fracture, right, closed, initial encounter  Fall, initial encounter      Joanne Gavel, MD 09/01/15 2131

## 2015-09-01 NOTE — ED Notes (Signed)
Pt BP 84/52, EDP made aware, fluids hung, pt states she feels dizzy, resting in bed

## 2015-09-02 ENCOUNTER — Other Ambulatory Visit: Payer: Self-pay

## 2015-09-02 ENCOUNTER — Other Ambulatory Visit: Payer: Medicare Other

## 2015-09-02 ENCOUNTER — Other Ambulatory Visit: Payer: Self-pay | Admitting: Family Medicine

## 2015-09-02 ENCOUNTER — Other Ambulatory Visit: Payer: Commercial Managed Care - HMO

## 2015-09-02 ENCOUNTER — Ambulatory Visit: Payer: Medicare Other | Admitting: Family Medicine

## 2015-09-02 ENCOUNTER — Ambulatory Visit: Payer: Medicare Other

## 2015-09-02 DIAGNOSIS — S42301A Unspecified fracture of shaft of humerus, right arm, initial encounter for closed fracture: Secondary | ICD-10-CM

## 2015-09-02 MED ORDER — GLIPIZIDE 5 MG PO TABS: 5.0000 mg | ORAL_TABLET | Freq: Every day | ORAL | Status: DC

## 2015-09-02 NOTE — Telephone Encounter (Signed)
[facility] left vm requesting refill glipizide. Pt last seen 07/16/15. Refilled per protocol.

## 2015-09-03 ENCOUNTER — Ambulatory Visit: Payer: Medicare Other | Admitting: Podiatry

## 2015-09-04 DIAGNOSIS — S42201D Unspecified fracture of upper end of right humerus, subsequent encounter for fracture with routine healing: Secondary | ICD-10-CM | POA: Diagnosis not present

## 2015-09-07 ENCOUNTER — Other Ambulatory Visit: Payer: Self-pay | Admitting: Gastroenterology

## 2015-09-07 ENCOUNTER — Other Ambulatory Visit: Payer: Commercial Managed Care - HMO

## 2015-09-08 ENCOUNTER — Other Ambulatory Visit: Payer: Self-pay

## 2015-09-08 ENCOUNTER — Telehealth: Payer: Self-pay

## 2015-09-08 MED ORDER — ACCU-CHEK AVIVA PLUS VI STRP: ORAL_STRIP | Status: DC

## 2015-09-08 NOTE — Telephone Encounter (Signed)
Stephanie [name] pts daughter in law(pt advised me to speak with Stephanie [name]); pts blood sugars are going from 300's to the 80s; pt eating appropriate diet and taking meds as instructed. On 09/07/15 pt fell with no apparent injury;pt BS"crashed" 09/07/15; BS was 60. EMTs helped pt up and pt was given OJ and food and Stephanie [name] said it took 4 hours to get BS to 124.  Now pt is eating and Stephanie [name] took BS and the BS was 115. Pt feels OK now. Stephanie [name] has taken med and cannot drive and request appt for 09/09/15. Pt scheduled 30 min appt 09/09/15 at 11 AM with Allie Bossier NP. Stephanie [name] understands if pt condition changes or worsens to call 911 and take to ED (Stephanie [name] cannot drive today due to already taking her medication).

## 2015-09-08 NOTE — Telephone Encounter (Signed)
Pt calls and request refill test strips to [facility]. Advised done per protocol.

## 2015-09-08 NOTE — Telephone Encounter (Signed)
Noted  

## 2015-09-09 ENCOUNTER — Ambulatory Visit: Payer: Medicare Other | Admitting: Primary Care

## 2015-09-09 ENCOUNTER — Observation Stay
Admission: EM | Admit: 2015-09-09 | Discharge: 2015-09-11 | Disposition: A | Discharge status: Skilled Nursing Facility | Payer: Medicare Other | Attending: Internal Medicine | Admitting: Internal Medicine

## 2015-09-09 ENCOUNTER — Other Ambulatory Visit: Payer: Self-pay | Admitting: *Deleted

## 2015-09-09 ENCOUNTER — Emergency Department: Payer: Medicare Other

## 2015-09-09 DIAGNOSIS — E11649 Type 2 diabetes mellitus with hypoglycemia without coma: Secondary | ICD-10-CM | POA: Insufficient documentation

## 2015-09-09 DIAGNOSIS — G629 Polyneuropathy, unspecified: Secondary | ICD-10-CM | POA: Diagnosis not present

## 2015-09-09 DIAGNOSIS — R109 Unspecified abdominal pain: Secondary | ICD-10-CM | POA: Diagnosis not present

## 2015-09-09 DIAGNOSIS — G2581 Restless legs syndrome: Secondary | ICD-10-CM | POA: Insufficient documentation

## 2015-09-09 DIAGNOSIS — R911 Solitary pulmonary nodule: Secondary | ICD-10-CM | POA: Diagnosis not present

## 2015-09-09 DIAGNOSIS — R0602 Shortness of breath: Secondary | ICD-10-CM | POA: Diagnosis not present

## 2015-09-09 DIAGNOSIS — K219 Gastro-esophageal reflux disease without esophagitis: Secondary | ICD-10-CM | POA: Diagnosis not present

## 2015-09-09 DIAGNOSIS — M79601 Pain in right arm: Secondary | ICD-10-CM | POA: Diagnosis not present

## 2015-09-09 DIAGNOSIS — R161 Splenomegaly, not elsewhere classified: Secondary | ICD-10-CM | POA: Insufficient documentation

## 2015-09-09 DIAGNOSIS — I700 Atherosclerosis of aorta: Secondary | ICD-10-CM | POA: Diagnosis not present

## 2015-09-09 DIAGNOSIS — F319 Bipolar disorder, unspecified: Secondary | ICD-10-CM | POA: Diagnosis not present

## 2015-09-09 DIAGNOSIS — Z8711 Personal history of peptic ulcer disease: Secondary | ICD-10-CM | POA: Diagnosis not present

## 2015-09-09 DIAGNOSIS — R531 Weakness: Principal | ICD-10-CM | POA: Insufficient documentation

## 2015-09-09 DIAGNOSIS — W19XXXS Unspecified fall, sequela: Secondary | ICD-10-CM | POA: Diagnosis not present

## 2015-09-09 DIAGNOSIS — R400 Somnolence: Secondary | ICD-10-CM | POA: Diagnosis not present

## 2015-09-09 DIAGNOSIS — Z86010 Personal history of colonic polyps: Secondary | ICD-10-CM | POA: Diagnosis not present

## 2015-09-09 DIAGNOSIS — E559 Vitamin D deficiency, unspecified: Secondary | ICD-10-CM | POA: Insufficient documentation

## 2015-09-09 DIAGNOSIS — S42251S Displaced fracture of greater tuberosity of right humerus, sequela: Secondary | ICD-10-CM | POA: Diagnosis not present

## 2015-09-09 DIAGNOSIS — Z9842 Cataract extraction status, left eye: Secondary | ICD-10-CM | POA: Diagnosis not present

## 2015-09-09 DIAGNOSIS — M549 Dorsalgia, unspecified: Secondary | ICD-10-CM | POA: Diagnosis not present

## 2015-09-09 DIAGNOSIS — R918 Other nonspecific abnormal finding of lung field: Secondary | ICD-10-CM | POA: Diagnosis not present

## 2015-09-09 DIAGNOSIS — R5383 Other fatigue: Secondary | ICD-10-CM | POA: Diagnosis not present

## 2015-09-09 DIAGNOSIS — M542 Cervicalgia: Secondary | ICD-10-CM | POA: Insufficient documentation

## 2015-09-09 DIAGNOSIS — Z23 Encounter for immunization: Secondary | ICD-10-CM | POA: Diagnosis not present

## 2015-09-09 DIAGNOSIS — M797 Fibromyalgia: Secondary | ICD-10-CM | POA: Insufficient documentation

## 2015-09-09 DIAGNOSIS — I2510 Atherosclerotic heart disease of native coronary artery without angina pectoris: Secondary | ICD-10-CM | POA: Diagnosis not present

## 2015-09-09 DIAGNOSIS — G8929 Other chronic pain: Secondary | ICD-10-CM | POA: Diagnosis not present

## 2015-09-09 DIAGNOSIS — I10 Essential (primary) hypertension: Secondary | ICD-10-CM | POA: Insufficient documentation

## 2015-09-09 DIAGNOSIS — G4700 Insomnia, unspecified: Secondary | ICD-10-CM | POA: Diagnosis not present

## 2015-09-09 DIAGNOSIS — R079 Chest pain, unspecified: Secondary | ICD-10-CM | POA: Diagnosis not present

## 2015-09-09 DIAGNOSIS — R32 Unspecified urinary incontinence: Secondary | ICD-10-CM | POA: Insufficient documentation

## 2015-09-09 DIAGNOSIS — G4733 Obstructive sleep apnea (adult) (pediatric): Secondary | ICD-10-CM | POA: Insufficient documentation

## 2015-09-09 DIAGNOSIS — K760 Fatty (change of) liver, not elsewhere classified: Secondary | ICD-10-CM | POA: Diagnosis not present

## 2015-09-09 DIAGNOSIS — R0989 Other specified symptoms and signs involving the circulatory and respiratory systems: Secondary | ICD-10-CM | POA: Insufficient documentation

## 2015-09-09 DIAGNOSIS — E1165 Type 2 diabetes mellitus with hyperglycemia: Secondary | ICD-10-CM | POA: Insufficient documentation

## 2015-09-09 DIAGNOSIS — F3189 Other bipolar disorder: Secondary | ICD-10-CM | POA: Diagnosis not present

## 2015-09-09 DIAGNOSIS — D696 Thrombocytopenia, unspecified: Secondary | ICD-10-CM | POA: Insufficient documentation

## 2015-09-09 DIAGNOSIS — Z9841 Cataract extraction status, right eye: Secondary | ICD-10-CM | POA: Diagnosis not present

## 2015-09-09 DIAGNOSIS — Z794 Long term (current) use of insulin: Secondary | ICD-10-CM | POA: Insufficient documentation

## 2015-09-09 DIAGNOSIS — G9341 Metabolic encephalopathy: Secondary | ICD-10-CM | POA: Diagnosis not present

## 2015-09-09 DIAGNOSIS — F419 Anxiety disorder, unspecified: Secondary | ICD-10-CM | POA: Insufficient documentation

## 2015-09-09 DIAGNOSIS — R42 Dizziness and giddiness: Secondary | ICD-10-CM | POA: Insufficient documentation

## 2015-09-09 DIAGNOSIS — K766 Portal hypertension: Secondary | ICD-10-CM | POA: Diagnosis not present

## 2015-09-09 DIAGNOSIS — K7581 Nonalcoholic steatohepatitis (NASH): Secondary | ICD-10-CM | POA: Diagnosis not present

## 2015-09-09 DIAGNOSIS — Z743 Need for continuous supervision: Secondary | ICD-10-CM | POA: Diagnosis not present

## 2015-09-09 DIAGNOSIS — E162 Hypoglycemia, unspecified: Secondary | ICD-10-CM | POA: Diagnosis present

## 2015-09-09 DIAGNOSIS — M1990 Unspecified osteoarthritis, unspecified site: Secondary | ICD-10-CM | POA: Insufficient documentation

## 2015-09-09 DIAGNOSIS — E785 Hyperlipidemia, unspecified: Secondary | ICD-10-CM | POA: Insufficient documentation

## 2015-09-09 DIAGNOSIS — M25511 Pain in right shoulder: Secondary | ICD-10-CM | POA: Insufficient documentation

## 2015-09-09 DIAGNOSIS — Z9049 Acquired absence of other specified parts of digestive tract: Secondary | ICD-10-CM | POA: Insufficient documentation

## 2015-09-09 DIAGNOSIS — R4182 Altered mental status, unspecified: Secondary | ICD-10-CM | POA: Diagnosis not present

## 2015-09-09 DIAGNOSIS — E1169 Type 2 diabetes mellitus with other specified complication: Secondary | ICD-10-CM | POA: Diagnosis not present

## 2015-09-09 DIAGNOSIS — K7460 Unspecified cirrhosis of liver: Secondary | ICD-10-CM | POA: Insufficient documentation

## 2015-09-09 DIAGNOSIS — J45909 Unspecified asthma, uncomplicated: Secondary | ICD-10-CM | POA: Diagnosis not present

## 2015-09-09 DIAGNOSIS — D638 Anemia in other chronic diseases classified elsewhere: Secondary | ICD-10-CM | POA: Diagnosis not present

## 2015-09-09 DIAGNOSIS — Z87891 Personal history of nicotine dependence: Secondary | ICD-10-CM | POA: Insufficient documentation

## 2015-09-09 DIAGNOSIS — Z8673 Personal history of transient ischemic attack (TIA), and cerebral infarction without residual deficits: Secondary | ICD-10-CM | POA: Insufficient documentation

## 2015-09-09 DIAGNOSIS — Z981 Arthrodesis status: Secondary | ICD-10-CM | POA: Diagnosis not present

## 2015-09-09 LAB — COMPREHENSIVE METABOLIC PANEL
ALBUMIN: 3.2 g/dL — AB (ref 3.5–5.0)
ALK PHOS: 122 U/L (ref 38–126)
ALT: 17 U/L (ref 14–54)
ANION GAP: 5 (ref 5–15)
AST: 37 U/L (ref 15–41)
BILIRUBIN TOTAL: 0.6 mg/dL (ref 0.3–1.2)
BUN: 14 mg/dL (ref 6–20)
CALCIUM: 8.6 mg/dL — AB (ref 8.9–10.3)
CO2: 25 mmol/L (ref 22–32)
Chloride: 106 mmol/L (ref 101–111)
Creatinine, Ser: 0.59 mg/dL (ref 0.44–1.00)
GFR calc non Af Amer: >60 mL/min (ref >60)
GLUCOSE: 119 mg/dL — AB (ref 65–99)
POTASSIUM: 3.6 mmol/L (ref 3.5–5.1)
Sodium: 136 mmol/L (ref 135–145)
TOTAL PROTEIN: 6.7 g/dL (ref 6.5–8.1)

## 2015-09-09 LAB — URINALYSIS COMPLETE WITH MICROSCOPIC (ARMC ONLY)
BACTERIA UA: NONE SEEN
Bilirubin Urine: NEGATIVE
Glucose, UA: NEGATIVE mg/dL
HGB URINE DIPSTICK: NEGATIVE
KETONES UR: NEGATIVE mg/dL
Leukocytes, UA: NEGATIVE
NITRITE: NEGATIVE
PH: 6 (ref 5.0–8.0)
PROTEIN: NEGATIVE mg/dL
RBC / HPF: NONE SEEN RBC/hpf (ref 0–5)
SPECIFIC GRAVITY, URINE: 1.009 (ref 1.005–1.030)

## 2015-09-09 LAB — CBC
HEMATOCRIT: 30 % — AB (ref 35.0–47.0)
Hemoglobin: 9.6 g/dL — ABNORMAL LOW (ref 12.0–16.0)
MCH: 27.5 pg (ref 26.0–34.0)
MCHC: 32.2 g/dL (ref 32.0–36.0)
MCV: 85.4 fL (ref 80.0–100.0)
Platelets: 123 10*3/uL — ABNORMAL LOW (ref 150–440)
RBC: 3.51 MIL/uL — ABNORMAL LOW (ref 3.80–5.20)
RDW: 19.3 % — AB (ref 11.5–14.5)
WBC: 3.6 10*3/uL (ref 3.6–11.0)

## 2015-09-09 LAB — GLUCOSE, CAPILLARY
Glucose-Capillary: 110 mg/dL — ABNORMAL HIGH (ref 65–99)
Glucose-Capillary: 79 mg/dL (ref 65–99)

## 2015-09-09 LAB — BASIC METABOLIC PANEL
BUN: 14 mg/dL (ref 4–21)
Creatinine: 0.6 mg/dL (ref 0.5–1.1)
Glucose: 119 mg/dL
Sodium: 136 mmol/L — AB (ref 137–147)

## 2015-09-09 LAB — TROPONIN I

## 2015-09-09 LAB — CBC AND DIFFERENTIAL: WBC: 3.6 10*3/mL

## 2015-09-09 LAB — HEPATIC FUNCTION PANEL: Bilirubin, Total: 0.6 mg/dL

## 2015-09-09 MED ORDER — SIMVASTATIN 10 MG PO TABS
10.0000 mg | ORAL_TABLET | Freq: Every day | ORAL | Status: DC
  Administered 2015-09-10 (×2): 10 mg via ORAL
  Filled 2015-09-09 (×2): qty 1

## 2015-09-09 MED ORDER — METFORMIN HCL 500 MG PO TABS: 1000.0000 mg | ORAL_TABLET | Freq: Two times a day (BID) | ORAL | Status: DC

## 2015-09-09 MED ORDER — LAMOTRIGINE 100 MG PO TABS
100.0000 mg | ORAL_TABLET | Freq: Every morning | ORAL | Status: DC
  Administered 2015-09-10 – 2015-09-11 (×2): 100 mg via ORAL
  Filled 2015-09-09 (×2): qty 1

## 2015-09-09 MED ORDER — SODIUM CHLORIDE 0.9 % IV BOLUS (SEPSIS)
1000.0000 mL | Freq: Once | INTRAVENOUS | Status: AC
  Administered 2015-09-09: 1000 mL via INTRAVENOUS

## 2015-09-09 MED ORDER — DICYCLOMINE HCL 10 MG PO CAPS
10.0000 mg | ORAL_CAPSULE | Freq: Three times a day (TID) | ORAL | Status: DC
Start: 2015-09-10 — End: 2015-09-11
  Administered 2015-09-10 – 2015-09-11 (×5): 10 mg via ORAL
  Filled 2015-09-09 (×7): qty 1

## 2015-09-09 MED ORDER — PNEUMOCOCCAL 13-VAL CONJ VACC IM SUSP
0.5000 mL | INTRAMUSCULAR | Status: AC
  Administered 2015-09-10: 0.5 mL via INTRAMUSCULAR
  Filled 2015-09-09: qty 0.5

## 2015-09-09 MED ORDER — MIRTAZAPINE 15 MG PO TABS
15.0000 mg | ORAL_TABLET | Freq: Every day | ORAL | Status: DC
  Administered 2015-09-10 (×2): 15 mg via ORAL
  Filled 2015-09-09 (×2): qty 1

## 2015-09-09 MED ORDER — ACETAMINOPHEN 650 MG RE SUPP: 650.0000 mg | Freq: Four times a day (QID) | RECTAL | Status: DC | PRN

## 2015-09-09 MED ORDER — DIAZEPAM 5 MG PO TABS
5.0000 mg | ORAL_TABLET | Freq: Two times a day (BID) | ORAL | Status: DC | PRN
  Administered 2015-09-10: 5 mg via ORAL
  Filled 2015-09-09: qty 1

## 2015-09-09 MED ORDER — ALBUTEROL SULFATE (2.5 MG/3ML) 0.083% IN NEBU: 2.5000 mg | INHALATION_SOLUTION | RESPIRATORY_TRACT | Status: DC | PRN

## 2015-09-09 MED ORDER — INSULIN ASPART 100 UNIT/ML ~~LOC~~ SOLN
0.0000 [IU] | Freq: Every day | SUBCUTANEOUS | Status: DC
  Filled 2015-09-09: qty 2

## 2015-09-09 MED ORDER — ZOLPIDEM TARTRATE 5 MG PO TABS
5.0000 mg | ORAL_TABLET | Freq: Every day | ORAL | Status: DC
  Administered 2015-09-10 (×2): 5 mg via ORAL
  Filled 2015-09-09 (×2): qty 1

## 2015-09-09 MED ORDER — FENTANYL CITRATE (PF) 100 MCG/2ML IJ SOLN
50.0000 ug | Freq: Once | INTRAMUSCULAR | Status: AC
  Administered 2015-09-09: 50 ug via INTRAVENOUS
  Filled 2015-09-09: qty 2

## 2015-09-09 MED ORDER — ROPINIROLE HCL 1 MG PO TABS
3.0000 mg | ORAL_TABLET | Freq: Every day | ORAL | Status: DC
  Administered 2015-09-10 (×2): 3 mg via ORAL
  Filled 2015-09-09 (×3): qty 3

## 2015-09-09 MED ORDER — PREGABALIN 50 MG PO CAPS
50.0000 mg | ORAL_CAPSULE | Freq: Three times a day (TID) | ORAL | Status: DC
  Administered 2015-09-10 – 2015-09-11 (×5): 50 mg via ORAL
  Filled 2015-09-09 (×5): qty 1

## 2015-09-09 MED ORDER — ONDANSETRON HCL 4 MG/2ML IJ SOLN: 4.0000 mg | Freq: Four times a day (QID) | INTRAMUSCULAR | Status: DC | PRN

## 2015-09-09 MED ORDER — ARIPIPRAZOLE 10 MG PO TABS
20.0000 mg | ORAL_TABLET | Freq: Every day | ORAL | Status: DC
  Administered 2015-09-10 – 2015-09-11 (×2): 20 mg via ORAL
  Filled 2015-09-09 (×2): qty 2

## 2015-09-09 MED ORDER — LAMOTRIGINE 100 MG PO TABS
200.0000 mg | ORAL_TABLET | Freq: Every day | ORAL | Status: DC
  Administered 2015-09-10 (×2): 200 mg via ORAL
  Filled 2015-09-09 (×2): qty 2

## 2015-09-09 MED ORDER — MOMETASONE FURO-FORMOTEROL FUM 200-5 MCG/ACT IN AERO
2.0000 | INHALATION_SPRAY | Freq: Two times a day (BID) | RESPIRATORY_TRACT | Status: DC
  Administered 2015-09-10 – 2015-09-11 (×4): 2 via RESPIRATORY_TRACT
  Filled 2015-09-09: qty 8.8

## 2015-09-09 MED ORDER — HEPARIN SODIUM (PORCINE) 5000 UNIT/ML IJ SOLN
5000.0000 [IU] | Freq: Three times a day (TID) | INTRAMUSCULAR | Status: DC
  Administered 2015-09-10 – 2015-09-11 (×5): 5000 [IU] via SUBCUTANEOUS
  Filled 2015-09-09 (×5): qty 1

## 2015-09-09 MED ORDER — DOCUSATE SODIUM 100 MG PO CAPS
100.0000 mg | ORAL_CAPSULE | Freq: Two times a day (BID) | ORAL | Status: DC
  Administered 2015-09-10 – 2015-09-11 (×4): 100 mg via ORAL
  Filled 2015-09-09 (×4): qty 1

## 2015-09-09 MED ORDER — CLONAZEPAM 0.5 MG PO TABS: 0.5000 mg | ORAL_TABLET | Freq: Two times a day (BID) | ORAL | Status: DC | PRN

## 2015-09-09 MED ORDER — INSULIN ASPART 100 UNIT/ML ~~LOC~~ SOLN
0.0000 [IU] | Freq: Three times a day (TID) | SUBCUTANEOUS | Status: DC
  Administered 2015-09-11: 2 [IU] via SUBCUTANEOUS

## 2015-09-09 MED ORDER — OXYCODONE HCL 5 MG PO TABS
5.0000 mg | ORAL_TABLET | Freq: Four times a day (QID) | ORAL | Status: DC | PRN
  Administered 2015-09-09 – 2015-09-11 (×5): 5 mg via ORAL
  Filled 2015-09-09 (×5): qty 1

## 2015-09-09 MED ORDER — ONDANSETRON HCL 4 MG PO TABS: 4.0000 mg | ORAL_TABLET | Freq: Four times a day (QID) | ORAL | Status: DC | PRN

## 2015-09-09 MED ORDER — PANTOPRAZOLE SODIUM 40 MG PO TBEC
40.0000 mg | DELAYED_RELEASE_TABLET | Freq: Every day | ORAL | Status: DC
  Administered 2015-09-10 – 2015-09-11 (×2): 40 mg via ORAL
  Filled 2015-09-09 (×2): qty 1

## 2015-09-09 MED ORDER — ACETAMINOPHEN 325 MG PO TABS: 650.0000 mg | ORAL_TABLET | Freq: Four times a day (QID) | ORAL | Status: DC | PRN

## 2015-09-09 MED ORDER — LAMOTRIGINE 25 MG PO TABS: 100.0000 mg | ORAL_TABLET | Freq: Two times a day (BID) | ORAL | Status: DC

## 2015-09-09 NOTE — ED Notes (Signed)
Patient's family called EMS for lethargy. Low BS at 42 when EMS arrived. Patient given 1/2 amp D50, recheck BS at 146.

## 2015-09-09 NOTE — ED Notes (Signed)
Tried to call report to [facility], was told that they would call back. There is a question as to if they are going to accept this patient.

## 2015-09-09 NOTE — H&P (Addendum)
[facility] [place] NAME: Stephanie [name]    MR#:  [id number]  DATE OF BIRTH:  1951-03-19  DATE OF ADMISSION:  09/09/2015  PRIMARY CARE PHYSICIAN: [name], MD   REQUESTING/REFERRING PHYSICIAN: Harvest Dark, MD  CHIEF COMPLAINT:   Chief Complaint  Patient presents with  . Hypoglycemia    HISTORY OF PRESENT ILLNESS:  Stephanie [name]  is a 65 y.o. female with a known history of Diabetes, hyperlipidemia, CVA, liver cirrhosis and sleep apnea. The patient was sent to the ED due to generalized weakness. Per EMS, the patient could not be waken up. She was found to have hypoglycemia with blood sugar at 42. She was treated with glucose and then she became alert and responsive. At that the patient still has generalized weakness and complains chest pain with tightness across center of the chest, lasted about 1 hour, exacerbated by movement. She has multiple fall recently. She was diagnosed with right humeral fracture after fall about one week ago. She denies any other symptoms. Her blood sugar increased to 75.  PAST MEDICAL HISTORY:   Past Medical History  Diagnosis Date  . Adenomatous colon polyp 08/27/2014    Polyps x 3  . Insomnia   . RLS (restless legs syndrome)   . Colon polyp   . Incontinence   . H/O: rheumatic fever   . Hyperlipidemia   . Unspecified disorders of nervous system   . Bipolar affective disorder ([place])     h/o  . H/O: CVA (cardiovascular accident)     TIA  . Fatty liver 04/09/08    found in abd CT  . Shortness of breath   . Obstructive sleep apnea     not using CPAP, last sleep study 2.5 years ago, per pt doctor is aware  . Blood transfusion 2000  . GERD (gastroesophageal reflux disease)   . Arthritis   . 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 Asthma   . Anxiety   . Depression   . Stroke [facility])     TIA- 2002  . Ulcer   . Diabetes mellitus without complication ([place])   . Anemia   . Blood transfusion  without reported diagnosis   . Sleep apnea   . Platelets decreased ([place])   . 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 Chronic abdominal pain     abdominal wall pain    PAST SURGICAL HISTORY:   Past Surgical History  Procedure Laterality Date  . Back surgery      x 45  . Cholecystectomy    . Rotator cuff repair      left  . Abdominal hysterectomy      total  . Lumbar fusion  10/09  . Hemorroidectomy      and colon polyp removed  . Abdominal exploration surgery    . Posterior cervical fusion/foraminotomy    . Eye surgery      bilat  . Lumbar wound debridement  05/09/2011    Procedure: LUMBAR WOUND DEBRIDEMENT;  Surgeon: Dahlia Bailiff;  Location: [place];  Service: Orthopedics;  Laterality: N/A;  IRRIGATION AND DEBRIDEMENT SPINAL WOUND  . Axillary artery cannulation via 8-mm hemashield graft, median sternotomy, extracorporeal circulation with deep hypothermic circulatory arrest, repair of  aortic dessection  01/23/2009    Dr Arlyce Dice  . Cataract      both eyes  . Colonoscopy    . Liver biopsy  04/10/2012    Procedure: LIVER BIOPSY;  Surgeon: Inda Castle, MD;  [facility];  Service: Endoscopy;  Laterality: N/A;  ultrasound to mark order for abd limited/liver to be marked to be sent by [email or web address]     SOCIAL HISTORY:   Social History  Substance Use Topics  . Smoking status: Former Smoker -- 1.50 packs/day for 50 years    Types: Cigarettes    Quit date: 04/06/2013  . Smokeless tobacco: Never Used  . Alcohol Use: No    FAMILY HISTORY:   Family History  Problem Relation Age of Onset  . Colon cancer Neg Hx   . Esophageal cancer Neg Hx   . Rectal cancer Neg Hx   . Breast cancer Mother   . Lung cancer Mother   . Stomach cancer Father   . Diabetes      4 aunts, and 1 uncle    DRUG ALLERGIES:   Allergies  Allergen Reactions  . Morphine And Related Other (See Comments)    Reaction:  Hypotension  . Doxycycline Hives, Swelling and Other (See Comments)     Reaction:  Facial swelling     REVIEW OF SYSTEMS:  CONSTITUTIONAL: No fever, Has poor oral intake and generalized weakness.  EYES: No blurred or double vision.  EARS, NOSE, AND THROAT: No tinnitus or ear pain.  RESPIRATORY: No cough, shortness of breath, wheezing or hemoptysis.  CARDIOVASCULAR: No chest pain, orthopnea, edema.  GASTROINTESTINAL: No nausea, vomiting, diarrhea or abdominal pain. No melena or bloody stool. GENITOURINARY: No dysuria, hematuria.  ENDOCRINE: No polyuria, nocturia,  HEMATOLOGY: No anemia, easy bruising or bleeding SKIN: No rash or lesion. MUSCULOSKELETAL: No joint pain or arthritis.   NEUROLOGIC: No tingling, numbness, weakness.  PSYCHIATRY: No anxiety or depression.   MEDICATIONS AT HOME:   Prior to Admission medications   Medication Sig Start Date End Date Taking? Authorizing Provider  ARIPiprazole (ABILIFY) 20 MG tablet Take 20 mg by mouth daily.    Yes Historical Provider, MD  budesonide-formoterol (SYMBICORT) 160-4.5 MCG/ACT inhaler Inhale 2 puffs into the lungs 2 (two) times daily.   Yes Historical Provider, MD  clonazePAM (KLONOPIN) 0.5 MG tablet Take 0.5 mg by mouth 2 (two) times daily as needed for anxiety.   Yes Historical Provider, MD  diazepam (VALIUM) 5 MG tablet Take 5 mg by mouth 2 (two) times daily as needed for anxiety.   Yes Historical Provider, MD  dicyclomine (BENTYL) 10 MG capsule Take 1 capsule (10 mg total) by mouth 3 (three) times daily before meals. 07/21/15  Yes Manus Gunning, MD  docusate sodium (COLACE) 100 MG capsule Take 100 mg by mouth 2 (two) times daily.    Yes Historical Provider, MD  glipiZIDE (GLUCOTROL) 5 MG tablet Take 1 tablet (5 mg total) by mouth daily before breakfast. 09/02/15  Yes Lucille Passy, MD  HYDROcodone-acetaminophen (NORCO/VICODIN) 5-325 MG tablet Take 1-2 tablets by mouth every 6 (six) hours as needed for moderate pain.   Yes Historical Provider, MD  lamoTRIgine (LAMICTAL) 100 MG tablet Take 100-200  mg by mouth 2 (two) times daily. Pt takes one tablet in the morning and two at bedtime.   Yes Historical Provider, MD  metFORMIN (GLUCOPHAGE) 500 MG tablet Take 2 tablets (1,000 mg total) by mouth 2 (two) times daily with a meal. 09/09/15  Yes Lucille Passy, MD  metoCLOPramide (REGLAN) 10 MG tablet Take 1 tablet (10 mg total) by mouth 4 (four) times daily -  before meals and at  bedtime. 07/14/15  Yes Carrie Mew, MD  mirtazapine (REMERON) 15 MG tablet Take 15 mg by mouth at bedtime.   Yes Historical Provider, MD  nystatin (MYCOSTATIN/NYSTOP) 100000 UNIT/GM POWD Apply 1 g topically 2 (two) times daily as needed (for rash).    Yes Historical Provider, MD  omeprazole (PRILOSEC) 20 MG capsule Take 40 mg by mouth daily.   Yes Historical Provider, MD  ondansetron (ZOFRAN-ODT) 4 MG disintegrating tablet Take 4 mg by mouth every 8 (eight) hours as needed for nausea or vomiting.   Yes Historical Provider, MD  oxyCODONE (ROXICODONE) 5 MG immediate release tablet Take 1 tablet (5 mg total) by mouth every 6 (six) hours as needed for moderate pain. Do not drive while taking this medication. 09/01/15  Yes Joanne Gavel, MD  pregabalin (LYRICA) 50 MG capsule Take 1 capsule (50 mg total) by mouth 3 (three) times daily. 08/06/15  Yes Trula Slade, DPM  rOPINIRole (REQUIP) 3 MG tablet Take 3 mg by mouth at bedtime.   Yes Historical Provider, MD  simvastatin (ZOCOR) 10 MG tablet Take 10 mg by mouth at bedtime.   Yes Historical Provider, MD  traMADol (ULTRAM) 50 MG tablet Take 100 mg by mouth 2 (two) times daily.   Yes Historical Provider, MD  zolpidem (AMBIEN) 5 MG tablet Take 5 mg by mouth at bedtime.    Yes Historical Provider, MD  tolterodine (DETROL LA) 2 MG 24 hr capsule Take 1 capsule (2 mg total) by mouth daily. Patient not taking: Reported on 08/18/2015 06/15/15   Lucille Passy, MD      VITAL SIGNS:  Blood pressure 122/65, pulse 102, temperature 97.7 F (36.5 C), temperature source Oral, resp. rate 13, height  5' 3"  (1.6 m), weight 99.791 kg (220 lb), SpO2 96 %.  PHYSICAL EXAMINATION:  GENERAL:  65 y.o.-year-old patient lying in the bed with no acute distress.  EYES: Pupils equal, round, reactive to light and accommodation. No scleral icterus. Extraocular muscles intact.  HEENT: Head atraumatic, normocephalic. Oropharynx and nasopharynx clear. Moist oral mucosa. NECK:  Supple, no jugular venous distention. No thyroid enlargement, no tenderness.  LUNGS: Normal breath sounds bilaterally, no wheezing, rales,rhonchi or crepitation. No use of accessory muscles of respiration.  CARDIOVASCULAR: S1, S2 normal. No murmurs, rubs, or gallops.  ABDOMEN: Soft, nontender, nondistended. Bowel sounds present. No organomegaly or mass.  EXTREMITIES: No pedal edema, cyanosis, or clubbing.  NEUROLOGIC: Cranial nerves II through XII are intact. Muscle strength 4/5 in all extremities. Sensation intact. Gait not checked.  PSYCHIATRIC: The patient is alert and oriented x 3.  SKIN: No obvious rash, lesion, or ulcer.   LABORATORY PANEL:   CBC  Recent Labs Lab 09/09/15 1546  WBC 3.6  HGB 9.6*  HCT 30.0*  PLT 123*   ------------------------------------------------------------------------------------------------------------------  Chemistries   Recent Labs Lab 09/09/15 1546  NA 136  K 3.6  CL 106  CO2 25  GLUCOSE 119*  BUN 14  CREATININE 0.59  CALCIUM 8.6*  AST 37  ALT 17  ALKPHOS 122  BILITOT 0.6   ------------------------------------------------------------------------------------------------------------------  Cardiac Enzymes  Recent Labs Lab 09/09/15 1546  TROPONINI <0.03   ------------------------------------------------------------------------------------------------------------------  RADIOLOGY:  Dg Chest Portable 1 View  09/09/2015  CLINICAL DATA:  65 year old female with chest pain and lethargy. Initial encounter. EXAM: PORTABLE CHEST 1 VIEW COMPARISON:  CT chest abdomen and pelvis  09/01/2015 and earlier. FINDINGS: Portable AP upright view at 1531 hours. Lordotic view. Chronic lower thoracic spinal stimulator and lower cervical  ACDF hardware. Stable cardiac size and mediastinal contours. No pneumothorax, pleural effusion or consolidation identified portably. Increased pulmonary vascular congestion, no overt edema suspected at this time. Visualized tracheal air column is within normal limits. IMPRESSION: Increased pulmonary vascularity without overt edema. Electronically Signed   By: Genevie Ann M.D.   On: 09/09/2015 15:50    EKG:   Orders placed or performed during the hospital encounter of 09/09/15  . ED EKG  . ED EKG   *Note: Due to a large number of results and/or encounters for the requested time period, some results have not been displayed. A complete set of results can be found in Results Review.    IMPRESSION AND PLAN:   Hypoglycemia The patient will be placed for observation. Hold glipizide and metformin, start sliding scale, D50 when necessary. Encourage oral intake.  Generalized weakness PT evaluation. Fall precaution.  Anemia of chronic disease.  Hemoglobin mildly decreased. I will check stool occult.    All the records are reviewed and case discussed with ED provider. Management plans discussed with the patient, family and they are in agreement.  CODE STATUS: DO NOT RESUSCITATE, the patient wants DO NOT RESUSCITATE.  TOTAL TIME TAKING CARE OF THIS PATIENT: 53 minutes.    Demetrios Loll M.D on 09/09/2015 at 9:08 PM  Between 7am to 6pm - Pager - [phone]  After 6pm go to [email or web address] - password [email or web address] [facility]  Office  [phone]  CC: Primary care physician; [name], MD

## 2015-09-09 NOTE — ED Provider Notes (Signed)
[facility] Emergency Department Provider Note  Time seen: 3:19 PM  I have reviewed the triage vital signs and the nursing notes.   HISTORY  Chief Complaint Hypoglycemia    HPI Stephanie [name] is a 65 y.o. female with a past medical history of diabetes, hyperlipidemia, bipolar, gastric reflux, depression, CVA, presents to the emergency department with generalized weakness/lethargy. According to EMS a family member called 53 as they could not awaken the patient. EMS arrived and found the patient to be hypoglycemic with a blood sugar 42. They were able to administer glucose, and the patient became much more alert, responsive. However the patient states she continued to feel weak and was experiencing chest pain/tightness so they transported her to the emergency department. Patient takes metformin and glipizide for her diabetes, does not take insulin. Patient had a fall with a right humerus fracture approximately one week ago, she states since the fall she has not been eating as much. Patient is taking pain medication for her right arm pain. Describes her chest discomfort as a tightness sensation, moderate in severity located across the center of her chest. Denies any trouble breathing, nausea or diaphoresis.Denies any focal deficits but states a feeling of generalized weakness/malaise.     Past Medical History  Diagnosis Date  . Adenomatous colon polyp 08/27/2014    Polyps x 3  . Insomnia   . RLS (restless legs syndrome)   . Colon polyp   . Incontinence   . H/O: rheumatic fever   . Hyperlipidemia   . Unspecified disorders of nervous system   . Bipolar affective disorder ([place])     h/o  . H/O: CVA (cardiovascular accident)     TIA  . Fatty liver 04/09/08    found in abd CT  . Shortness of breath   . Obstructive sleep apnea     not using CPAP, last sleep study 2.5 years ago, per pt doctor is aware  . Blood transfusion 2000  . GERD (gastroesophageal reflux  disease)   . Arthritis   . 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 Asthma   . Anxiety   . Depression   . Stroke [facility])     TIA- 2002  . Ulcer   . Diabetes mellitus without complication ([place])   . Anemia   . Blood transfusion without reported diagnosis   . Sleep apnea   . Platelets decreased ([place])   . 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 Chronic abdominal pain     abdominal wall pain    Patient Active Problem List   Diagnosis Date Noted  . Hyperglycemia 07/16/2015  . Neuropathy ([place]) 06/08/2015  . Thrombocytopenia ([place]) 08/19/2014  . Abdominal pain, chronic, epigastric 06/12/2014  . Liver cirrhosis secondary to NASH 02/27/2014  . Dizziness and giddiness 10/30/2013  . DOE (dyspnea on exertion) 10/30/2013  . Poorly controlled type 2 diabetes mellitus with complication ([place]) 33/82/5053  . DNR (do not resuscitate) 07/29/2013  . Encounter for routine gynecological examination 07/29/2013  . Dyspnea 07/10/2013  . Lung nodule 07/10/2013  . Varicose veins of lower extremities with other complications 97/67/3419  . GERD (gastroesophageal reflux disease) 10/09/2012  . Cirrhosis of liver ([place]) 02/02/2012  . Anxiety 07/11/2011  . Aortic dissection ([place])   . BACK PAIN, LUMBAR, CHRONIC 11/19/2009  . UNS ADVRS EFF OTH RX MEDICINAL&BIOLOGICAL SBSTNC 12/16/2008  . UNSPECIFIED VITAMIN D DEFICIENCY 09/26/2008  . TOBACCO ABUSE 06/19/2008  . RESTLESS  LEGS SYNDROME 04/27/2007  . INSOMNIA 04/27/2007  . HLD (hyperlipidemia) 03/08/2007  . OSA (obstructive sleep apnea) 03/08/2007  . Essential hypertension 03/08/2007  . Asthma 03/08/2007  . BIPOLAR AFFECTIVE DISORDER, HX OF 03/08/2007    Past Surgical History  Procedure Laterality Date  . Back surgery      x 45  . Cholecystectomy    . Rotator cuff repair      left  . Abdominal hysterectomy      total  . Lumbar fusion  10/09  . Hemorroidectomy      and colon polyp removed  . Abdominal exploration surgery    . Posterior cervical  fusion/foraminotomy    . Eye surgery      bilat  . Lumbar wound debridement  05/09/2011    Procedure: LUMBAR WOUND DEBRIDEMENT;  Surgeon: Dahlia Bailiff;  Location: [place];  Service: Orthopedics;  Laterality: N/A;  IRRIGATION AND DEBRIDEMENT SPINAL WOUND  . Axillary artery cannulation via 8-mm hemashield graft, median sternotomy, extracorporeal circulation with deep hypothermic circulatory arrest, repair of  aortic dessection  01/23/2009    Dr Arlyce Dice  . Cataract      both eyes  . Colonoscopy    . Liver biopsy  04/10/2012    Procedure: LIVER BIOPSY;  Surgeon: Inda Castle, MD;  [facility];  Service: Endoscopy;  Laterality: N/A;  ultrasound to mark order for abd limited/liver to be marked to be sent by [email or web address]     Current Outpatient Rx  Name  Route  Sig  Dispense  Refill  . ACCU-CHEK AVIVA PLUS test strip      test blood sugar once daily and as directed. E11.8   100 each   1     Dispense as written.   . ARIPiprazole (ABILIFY) 20 MG tablet   Oral   Take 20 mg by mouth daily.          . budesonide-formoterol (SYMBICORT) 160-4.5 MCG/ACT inhaler   Inhalation   Inhale 2 puffs into the lungs 2 (two) times daily.         . clonazePAM (KLONOPIN) 0.5 MG tablet   Oral   Take 0.5 mg by mouth 2 (two) times daily as needed for anxiety.         . diazepam (VALIUM) 5 MG tablet   Oral   Take 5 mg by mouth 2 (two) times daily as needed for anxiety.         . dicyclomine (BENTYL) 10 MG capsule   Oral   Take 1 capsule (10 mg total) by mouth 3 (three) times daily before meals.   90 capsule   3   . docusate sodium (COLACE) 100 MG capsule   Oral   Take 100 mg by mouth 2 (two) times daily as needed for mild constipation.         Marland Kitchen glipiZIDE (GLUCOTROL) 5 MG tablet   Oral   Take 1 tablet (5 mg total) by mouth daily before breakfast.   90 tablet   1   . lamoTRIgine (LAMICTAL) 100 MG tablet   Oral   Take 100-200 mg by mouth 2 (two) times daily. Pt takes one  tablet in the morning and two at bedtime.         . metFORMIN (GLUCOPHAGE) 500 MG tablet   Oral   Take 1,000 mg by mouth 2 (two) times daily with a meal.         . metoCLOPramide (REGLAN) 10 MG tablet  Oral   Take 1 tablet (10 mg total) by mouth 4 (four) times daily -  before meals and at bedtime.   60 tablet   0   . mirtazapine (REMERON) 15 MG tablet   Oral   Take 15 mg by mouth at bedtime.         Marland Kitchen nystatin (MYCOSTATIN/NYSTOP) 100000 UNIT/GM POWD   Topical   Apply 1 g topically 2 (two) times daily.         Marland Kitchen omeprazole (PRILOSEC) 20 MG capsule   Oral   Take 40 mg by mouth daily.         . ondansetron (ZOFRAN-ODT) 4 MG disintegrating tablet   Oral   Take 4 mg by mouth every 8 (eight) hours as needed for nausea or vomiting.         . ondansetron (ZOFRAN-ODT) 4 MG disintegrating tablet      TAKE 1 TABLET (4 MG TOTAL) BY MOUTH EVERY 8 (EIGHT) HOURS AS NEEDED FOR NAUSEA OR VOMITING.   30 tablet   0   . oxyCODONE (ROXICODONE) 5 MG immediate release tablet   Oral   Take 1 tablet (5 mg total) by mouth every 6 (six) hours as needed for moderate pain. Do not drive while taking this medication.   12 tablet   0   . pregabalin (LYRICA) 50 MG capsule   Oral   Take 1 capsule (50 mg total) by mouth 3 (three) times daily.   90 capsule   1   . rOPINIRole (REQUIP) 3 MG tablet   Oral   Take 3 mg by mouth at bedtime.         . simvastatin (ZOCOR) 10 MG tablet   Oral   Take 10 mg by mouth at bedtime.         . tolterodine (DETROL LA) 2 MG 24 hr capsule   Oral   Take 1 capsule (2 mg total) by mouth daily. Patient not taking: Reported on 08/18/2015   90 capsule   2   . traMADol (ULTRAM) 50 MG tablet      TAKE 2 TABLETS BY MOUTH IN THE MORNING AND 2 TABLETS IN THE EVENING   120 tablet   0     Not to exceed 5 additional fills before 01/17/2016 ...   . zolpidem (AMBIEN) 5 MG tablet   Oral   Take 5 mg by mouth at bedtime as needed for sleep.            Allergies Morphine and related and Doxycycline  Family History  Problem Relation Age of Onset  . Colon cancer Neg Hx   . Esophageal cancer Neg Hx   . Rectal cancer Neg Hx   . Breast cancer Mother   . Lung cancer Mother   . Stomach cancer Father   . Diabetes      4 aunts, and 1 uncle    Social History Social History  Substance Use Topics  . Smoking status: Former Smoker -- 1.50 packs/day for 50 years    Types: Cigarettes    Quit date: 04/06/2013  . Smokeless tobacco: Never Used  . Alcohol Use: No    Review of Systems Constitutional: Negative for fever. Positive for generalized weakness. Cardiovascular: Positive for chest tightness. Respiratory: Negative for shortness of breath. Gastrointestinal: Negative for abdominal pain, vomiting and diarrhea. Genitourinary: Negative for dysuria. Neurological: Negative for headache 10-point ROS otherwise negative.  ____________________________________________   PHYSICAL EXAM:  Constitutional: Alert and oriented. Well  appearing and in no distress. Eyes: Normal exam ENT   Head: Normocephalic and atraumatic.   Mouth/Throat: Mucous membranes are moist. Cardiovascular: Normal rate, regular rhythm. No murmur Respiratory: Normal respiratory effort without tachypnea nor retractions. Breath sounds are clear. Moderate central chest tenderness to palpation. Gastrointestinal: Soft and nontender. No distention.   Musculoskeletal: Nontender with normal range of motion in all extremities. No lower extremity edema. Neurologic:  Normal speech and language. No gross focal neurologic deficits  Skin:  Skin is warm, dry and intact.  Psychiatric: Mood and affect are normal. Speech and behavior are normal.  ____________________________________________   RADIOLOGY  Chest x-ray shows increased pulmonary vasculature without edema.  EKG reviewed and interpreted by myself shows normal sinus rhythm at 90 bpm, narrow QRS, normal axis,  normal intervals, no ST changes. Overall normal EKG.  ____________________________________________    INITIAL IMPRESSION / ASSESSMENT AND PLAN / ED COURSE  Pertinent labs & imaging results that were available during my care of the patient were reviewed by me and considered in my medical decision making (see chart for details).  Patient presents with generalized weakness/malaise. Patient was found to be hypoglycemic with a blood sugar of 42. Patient was administered dextrose and became much more responsive per EMS. We will check a fingerstick in the emergency department, check labs, IV hydrate, check an EKG and troponin and closely monitor.  Finger stick in the emergency department is 110.  Repeat fingerstick is 79. We'll feed the patient. She continues to complain of generalized weakness. The daughter is now here he states the patient has been falling multiple times including today, she can no longer care for the patient at home. Given the patient's generalized weakness, and hypoglycemia we will admit to the hospital. Patient and Daughter is agreeable.  ____________________________________________   FINAL CLINICAL IMPRESSION(S) / ED DIAGNOSES  Hypoglycemia Generalized weakness  Harvest Dark, MD 09/09/15 2122

## 2015-09-10 ENCOUNTER — Telehealth: Payer: Self-pay | Admitting: Family Medicine

## 2015-09-10 ENCOUNTER — Ambulatory Visit: Payer: Medicare Other | Admitting: Primary Care

## 2015-09-10 DIAGNOSIS — E162 Hypoglycemia, unspecified: Secondary | ICD-10-CM | POA: Diagnosis not present

## 2015-09-10 DIAGNOSIS — R531 Weakness: Secondary | ICD-10-CM | POA: Diagnosis not present

## 2015-09-10 DIAGNOSIS — G2581 Restless legs syndrome: Secondary | ICD-10-CM | POA: Diagnosis not present

## 2015-09-10 DIAGNOSIS — S42301D Unspecified fracture of shaft of humerus, right arm, subsequent encounter for fracture with routine healing: Secondary | ICD-10-CM | POA: Diagnosis not present

## 2015-09-10 DIAGNOSIS — G4700 Insomnia, unspecified: Secondary | ICD-10-CM | POA: Diagnosis not present

## 2015-09-10 LAB — GLUCOSE, CAPILLARY
GLUCOSE-CAPILLARY: 97 mg/dL (ref 65–99)
Glucose-Capillary: 127 mg/dL — ABNORMAL HIGH (ref 65–99)
Glucose-Capillary: 193 mg/dL — ABNORMAL HIGH (ref 65–99)
Glucose-Capillary: 98 mg/dL (ref 65–99)
Glucose-Capillary: 99 mg/dL (ref 65–99)

## 2015-09-10 NOTE — Clinical Social Work Note (Signed)
Clinical Social Work Assessment  Patient Details  Name: Stephanie [name] MRN: [id number] Date of Birth: 26-Dec-1950  Date of referral:  09/10/15               Reason for consult:  Facility Placement                Permission sought to share information with:  Chartered certified accountant granted to share information::  Yes, Verbal Permission Granted  Name::      [place]::   [place]   Relationship::     Contact Information:     Housing/Transportation Living arrangements for the past 2 months:  [place] of Information:  Patient Patient Interpreter Needed:  None Criminal Activity/Legal Involvement Pertinent to Current Situation/Hospitalization:  No - Comment as needed Significant Relationships:  Other(Comment) (Ex- daughter in law Tammy) Lives with:  Self Do you feel safe going back to the place where you live?  Yes Need for family participation in patient care:  Yes (Comment)  Care giving concerns: Patient lives alone in [place].     Social Worker assessment / plan:  Holiday representative (CSW) received verbal consult from PT that recommendation is SNF. CSW met with patient alone at bedside. CSW introduced self and explained role of CSW department. Patient was alert and oriented and laying in the bed. Patient reported that she lives in [place] alone. Per patient she has no family support except for her ex-daughter in law Tammy. Patient requested [facility].   FL2 complete and faxed out. CSW asked [place] liaison to review referral. CSW will continue to follow and assist as needed.   Employment status:  Disabled (Comment on whether or not currently receiving Disability), Retired Nurse, adult, Medicaid In [place] PT Recommendations:  [place] / Referral to community resources:  [place]  Patient/Family's Response to care:  Patient is  agreeable to [employer] and prefers [employer]   Patient/Family's Understanding of and Emotional Response to Diagnosis, Current Treatment, and Prognosis:  Patient was pleasant and thanked CSW for visit.   Emotional Assessment Appearance:  Appears stated age Attitude/Demeanor/Rapport:    Affect (typically observed):  Accepting, Adaptable, Pleasant Orientation:  Oriented to Self, Oriented to Place, Oriented to  Time, Oriented to Situation Alcohol / Substance use:  Not Applicable Psych involvement (Current and /or in the community):  No (Comment)  Discharge Needs  Concerns to be addressed:  Discharge Planning Concerns Readmission within the last 30 days:  No Current discharge risk:  Dependent with Mobility Barriers to Discharge:  Continued Medical Work up   Loralyn Freshwater, LCSW 09/10/2015, 11:57 AM

## 2015-09-10 NOTE — Progress Notes (Signed)
Spoke with patient regarding Code status after family notified this Probation officer that she does not want to be a DNR, stated she wants "everything done". TC to MD Michel Santee) made aware of wishes, change code status to full code.

## 2015-09-10 NOTE — Care Management Obs Status (Signed)
[place] NOTIFICATION   Patient Details  Name: Stephanie [name] MRN: [id number] Date of Birth: [birth year]   Medicare Observation Status Notification Given:  Yes    Marshell Garfinkel, RN 09/10/2015, 8:44 AM

## 2015-09-10 NOTE — Progress Notes (Signed)
Clinical Education officer, museum (CSW) met with patient and her daughter in law Tammie was at bedside. CSW presented bed offers. Patient chose [facility]. [place] admissions coordinator at [facility] is aware of accepted bed offer. Patient inquired about long term care. CSW explained that if patient requires long term care after rehab then [facility] will assist with that. CSW explained that patient already has Medicaid and it will need to be switched to long term care Medicaid. Patient verbalized her understanding. CSW will continue to follow and assist as needed.   Blima Rich, LCSW [phone]

## 2015-09-10 NOTE — Progress Notes (Signed)
[place] [facility] [place] NAME: Stephanie [name]    MR#:  [id number]  DATE OF BIRTH:  Aug 27, 1950  SUBJECTIVE:admitted for hypoglycemia,improved with D10.pt had recent right shoulder fracture and has been taking tramadol for pain and may have had hypoglycemia unware nessdue to sedation from tramadol.according to DIL has  Been having issues with low BG.norecent change in dose of DM med  CHIEF COMPLAINT:   Chief Complaint  Patient presents with  . Hypoglycemia    REVIEW OF SYSTEMS:    Review of Systems  Constitutional: Negative for fever and chills.  HENT: Negative for hearing loss.   Eyes: Negative for blurred vision, double vision and photophobia.  Respiratory: Negative for cough, hemoptysis and shortness of breath.   Cardiovascular: Negative for palpitations, orthopnea and leg swelling.  Gastrointestinal: Negative for vomiting, abdominal pain and diarrhea.  Genitourinary: Negative for dysuria and urgency.  Musculoskeletal: Negative for myalgias and neck pain.       Right shoulder pain  Skin: Negative for rash.  Neurological: Negative for dizziness, focal weakness, seizures, weakness and headaches.  Psychiatric/Behavioral: Negative for memory loss. The patient does not have insomnia.     Nutrition:  Tolerating Diet: Tolerating PT:      DRUG ALLERGIES:   Allergies  Allergen Reactions  . Morphine And Related Other (See Comments)    Reaction:  Hypotension  . Doxycycline Hives, Swelling and Other (See Comments)    Reaction:  Facial swelling     VITALS:  Blood pressure 114/58, pulse 86, temperature 98.4 F (36.9 C), temperature source Oral, resp. rate 18, height 5' 3"  (1.6 m), weight 103.284 kg (227 lb 11.2 oz), SpO2 98 %.  PHYSICAL EXAMINATION:   Physical Exam  GENERAL:  65 y.o.-year-old patient lying in the bed with no acute distress.  EYES: Pupils equal, round, reactive to light and accommodation. No scleral icterus.  Extraocular muscles intact.  HEENT: Head atraumatic, normocephalic. Oropharynx and nasopharynx clear.  NECK:  Supple, no jugular venous distention. No thyroid enlargement, no tenderness.  LUNGS: Normal breath sounds bilaterally, no wheezing, rales,rhonchi or crepitation. No use of accessory muscles of respiration.  CARDIOVASCULAR: S1, S2 normal. No murmurs, rubs, or gallops.  ABDOMEN: Soft, nontender, nondistended. Bowel sounds present. No organomegaly or mass.  EXTREMITIES: No pedal edema, cyanosis, or clubbing. Right shoulder is in the sling. NEUROLOGIC: Cranial nerves II through XII are intact. Muscle strength 5/5 in all extremities. Sensation intact. Gait not checked.  PSYCHIATRIC: The patient is alert and oriented x 3.  SKIN: No obvious rash, lesion, or ulcer.    LABORATORY PANEL:   CBC  Recent Labs Lab 09/09/15 1546  WBC 3.6  HGB 9.6*  HCT 30.0*  PLT 123*   ------------------------------------------------------------------------------------------------------------------  Chemistries   Recent Labs Lab 09/09/15 1546  NA 136  K 3.6  CL 106  CO2 25  GLUCOSE 119*  BUN 14  CREATININE 0.59  CALCIUM 8.6*  AST 37  ALT 17  ALKPHOS 122  BILITOT 0.6   ------------------------------------------------------------------------------------------------------------------  Cardiac Enzymes  Recent Labs Lab 09/09/15 1546  TROPONINI <0.03   ------------------------------------------------------------------------------------------------------------------  RADIOLOGY:  Dg Chest Portable 1 View  09/09/2015  CLINICAL DATA:  65 year old female with chest pain and lethargy. Initial encounter. EXAM: PORTABLE CHEST 1 VIEW COMPARISON:  CT chest abdomen and pelvis 09/01/2015 and earlier. FINDINGS: Portable AP upright view at 1531 hours. Lordotic view. Chronic lower thoracic spinal stimulator and lower cervical ACDF hardware. Stable cardiac size and mediastinal contours.  No pneumothorax,  pleural effusion or consolidation identified portably. Increased pulmonary vascular congestion, no overt edema suspected at this time. Visualized tracheal air column is within normal limits. IMPRESSION: Increased pulmonary vascularity without overt edema. Electronically Signed   By: Genevie Ann M.D.   On: 09/09/2015 15:50     ASSESSMENT AND PLAN:   Principal Problem:   Hypoglycemia   1.Hypoglycemia with BG 42 on  Admission.[place] due to sedation from tramadol and unaware;found unresponsive by family,hypoglycemia improved.hold metformin,glipizide.stopped D10 iv fluids.continue SSI with coverage  2.right humerus fracture;on sling;PT recommends SNF,family chose [facility].   3.RLS;on requip 4.. Insomnia 5.h/o Bipolar disorder;on Lamictal H/o anxiety/depression;on Klonopin and depression All the records are reviewed and case discussed with Care Management/Social Workerr. Management plans discussed with the patient, family and they are in agreement.  CODE STATUS: wants to full code now,previously was dnr  TOTAL TIME TAKING CARE OF THIS PATIENT: 35 minutes.   POSSIBLE D/C IN 1-2 DAYS, DEPENDING ON CLINICAL CONDITION.   Epifanio Lesches M.D on 09/10/2015 at 9:12 PM  Between 7am to 6pm - Pager - [phone]  After 6pm go to [email or web address] - password [email or web address] [place] [facility]  Office  [phone]  CC: Primary care physician; [name], MD

## 2015-09-10 NOTE — Care Management Note (Addendum)
Case Management Note  Patient Details  Name: La K Mcclintock MRN: [id number] Date of Birth: 06/07/1950  Subjective/Objective:                  Met with patient who states she will need to go to SNF and potentially long-term care under Medicaid. She has done this before. She has used [facility] before. She claims that she has no family and then an ex-daughter in law appeared wanted to talk to social worker. Patient states she ambulates with a cane. She has been to [facility] and wishes to go there again. She has a right humerous fracture/arm in a sling. Her PCP is with [facility].  O2 is new.  Action/Plan: List of home health agencies left at bedside. PT is recommending SNF- CSW working on SNF bed.   Expected Discharge Date:  09/11/15               Expected Discharge Plan:     In-House Referral:  Clinical Social Work  Discharge planning Services  CM Consult  Post Acute Care Choice:    Choice offered to:  Patient  DME Arranged:    DME Agency:     HH Arranged:    HH Agency:     Status of Service:  In process, will continue to follow  Medicare Important Message Given:    Date Medicare IM Given:    Medicare IM give by:    Date Additional Medicare IM Given:    Additional Medicare Important Message give by:     If discussed at Long Length of Stay Meetings, dates discussed:    Additional Comments:   , RN 09/10/2015, 11:48 AM  

## 2015-09-10 NOTE — NC FL2 (Signed)
[place] LEVEL OF CARE SCREENING TOOL     IDENTIFICATION  Patient Name: Stephanie [name] Birthdate: Sep 14, 1950 Sex: female Admission Date (Current Location): 09/09/2015  [place] and [place] Number:  [name]  ([id number] [facility]) Facility and Address:  [facility], [address], [place], [place] [address]      Provider Number: [id number]  Attending Physician Name and Address:  Epifanio Lesches, MD  Relative Name and Phone Number:       Current Level of Care: Hospital Recommended Level of Care: [place] Prior Approval Number:    Date Approved/Denied:   PASRR Number:  ( [id number] )  Discharge Plan: SNF    Current Diagnoses: Patient Active Problem List   Diagnosis Date Noted  . Hypoglycemia 09/09/2015  . Hyperglycemia 07/16/2015  . Neuropathy ([place]) 06/08/2015  . Thrombocytopenia ([place]) 08/19/2014  . Abdominal pain, chronic, epigastric 06/12/2014  . Liver cirrhosis secondary to NASH 02/27/2014  . Dizziness and giddiness 10/30/2013  . DOE (dyspnea on exertion) 10/30/2013  . Poorly controlled type 2 diabetes mellitus with complication ([place]) 87/86/7672  . DNR (do not resuscitate) 07/29/2013  . Encounter for routine gynecological examination 07/29/2013  . Dyspnea 07/10/2013  . Lung nodule 07/10/2013  . Varicose veins of lower extremities with other complications 09/47/0962  . GERD (gastroesophageal reflux disease) 10/09/2012  . Cirrhosis of liver ([place]) 02/02/2012  . Anxiety 07/11/2011  . Aortic dissection ([place])   . BACK PAIN, LUMBAR, CHRONIC 11/19/2009  . UNS ADVRS EFF OTH RX MEDICINAL&BIOLOGICAL SBSTNC 12/16/2008  . UNSPECIFIED VITAMIN D DEFICIENCY 09/26/2008  . TOBACCO ABUSE 06/19/2008  . RESTLESS LEGS SYNDROME 04/27/2007  . INSOMNIA 04/27/2007  . HLD (hyperlipidemia) 03/08/2007  . OSA (obstructive sleep apnea) 03/08/2007  . Essential hypertension 03/08/2007  . Asthma 03/08/2007  . BIPOLAR AFFECTIVE DISORDER,  HX OF 03/08/2007    Orientation RESPIRATION BLADDER Height & Weight     Self, Time, Situation, Place  O2 (1 Liter Oxygen ) Continent Weight: 227 lb 11.2 oz (103.284 kg) Height:  5' 3"  (160 cm)  BEHAVIORAL SYMPTOMS/MOOD NEUROLOGICAL BOWEL NUTRITION STATUS   (none )  (none ) Continent Diet (Diet: Heart Healthy/ Carb Modified )  AMBULATORY STATUS COMMUNICATION OF NEEDS Skin   Extensive Assist Verbally Other (Comment) (Right humerus fx in a sling )                       Personal Care Assistance Level of Assistance  Bathing, Feeding, Dressing Bathing Assistance: Limited assistance Feeding assistance: Independent Dressing Assistance: Limited assistance     Functional Limitations Info  Sight, Hearing, Speech Sight Info: Adequate Hearing Info: Adequate Speech Info: Adequate    SPECIAL CARE FACTORS FREQUENCY  PT (By licensed PT), OT (By licensed OT)     PT Frequency:  (5) OT Frequency:  (5)            Contractures      Additional Factors Info  Code Status, Allergies, Insulin Sliding Scale Code Status Info:  (DNR ) Allergies Info:  (Morphine And Related, Doxycycline)   Insulin Sliding Scale Info:  (NovoLog Insulin Injections )       Current Medications (09/10/2015):  This is the current hospital active medication list Current Facility-Administered Medications  Medication Dose Route Frequency Provider Last Rate Last Dose  . acetaminophen (TYLENOL) tablet 650 mg  650 mg Oral Q6H PRN Demetrios Loll, MD       Or  . acetaminophen (TYLENOL) suppository 650 mg  650  mg Rectal Q6H PRN Demetrios Loll, MD      . albuterol (PROVENTIL) (2.5 MG/3ML) 0.083% nebulizer solution 2.5 mg  2.5 mg Nebulization Q2H PRN Demetrios Loll, MD      . ARIPiprazole (ABILIFY) tablet 20 mg  20 mg Oral Daily Demetrios Loll, MD   20 mg at 09/10/15 0956  . clonazePAM (KLONOPIN) tablet 0.5 mg  0.5 mg Oral BID PRN Demetrios Loll, MD      . diazepam (VALIUM) tablet 5 mg  5 mg Oral BID PRN Demetrios Loll, MD   5 mg at 09/10/15 0007   . dicyclomine (BENTYL) capsule 10 mg  10 mg Oral TID [facility] Demetrios Loll, MD   10 mg at 09/10/15 0956  . docusate sodium (COLACE) capsule 100 mg  100 mg Oral BID Demetrios Loll, MD   100 mg at 09/10/15 0841  . heparin injection 5,000 Units  5,000 Units Subcutaneous 3 times per day Demetrios Loll, MD   5,000 Units at 09/10/15 0609  . insulin aspart (novoLOG) injection 0-5 Units  0-5 Units Subcutaneous QHS Demetrios Loll, MD   0 Units at 09/10/15 0025  . insulin aspart (novoLOG) injection 0-9 Units  0-9 Units Subcutaneous TID WC Demetrios Loll, MD   0 Units at 09/10/15 0800  . lamoTRIgine (LAMICTAL) tablet 100 mg  100 mg Oral q morning - 10a Demetrios Loll, MD   100 mg at 09/10/15 0840  . lamoTRIgine (LAMICTAL) tablet 200 mg  200 mg Oral QHS Demetrios Loll, MD   200 mg at 09/10/15 0005  . mirtazapine (REMERON) tablet 15 mg  15 mg Oral QHS Demetrios Loll, MD   15 mg at 09/10/15 0006  . mometasone-formoterol (DULERA) 200-5 MCG/ACT inhaler 2 puff  2 puff Inhalation BID Demetrios Loll, MD   2 puff at 09/10/15 [phone]  . ondansetron (ZOFRAN) tablet 4 mg  4 mg Oral Q6H PRN Demetrios Loll, MD       Or  . ondansetron [facility]) injection 4 mg  4 mg Intravenous Q6H PRN Demetrios Loll, MD      . oxyCODONE (Oxy IR/ROXICODONE) immediate release tablet 5 mg  5 mg Oral Q6H PRN Demetrios Loll, MD   5 mg at 09/10/15 0609  . pantoprazole (PROTONIX) EC tablet 40 mg  40 mg Oral Daily Demetrios Loll, MD   40 mg at 09/10/15 0841  . pregabalin (LYRICA) capsule 50 mg  50 mg Oral TID Demetrios Loll, MD   50 mg at 09/10/15 0841  . rOPINIRole (REQUIP) tablet 3 mg  3 mg Oral QHS Demetrios Loll, MD   3 mg at 09/10/15 0016  . simvastatin (ZOCOR) tablet 10 mg  10 mg Oral QHS Demetrios Loll, MD   10 mg at 09/10/15 0015  . zolpidem (AMBIEN) tablet 5 mg  5 mg Oral QHS Demetrios Loll, MD   5 mg at 09/10/15 0005     Discharge Medications: Please see discharge summary for a list of discharge medications.  Relevant Imaging Results:  Relevant Lab Results:   Additional Information  (SSN: [id number])  Loralyn Freshwater, LCSW

## 2015-09-10 NOTE — Telephone Encounter (Signed)
Patient did not come for their scheduled appointment today forBS going up and down .  Please let me know if the patient needs to be contacted immediately for follow up or if no follow up is necessary.

## 2015-09-10 NOTE — Clinical Social Work Placement (Signed)
   CLINICAL SOCIAL WORK PLACEMENT  NOTE  Date:  09/10/2015  Patient Details  Name: Stephanie [name] MRN: [id number] Date of Birth: [birth year]  Clinical Social Work is seeking post-discharge placement for this patient at the [place] level of care (*CSW will initial, date and re-position this form in  chart as items are completed):  Yes   Patient/family provided with [facility] Work Department's list of facilities offering this level of care within the geographic area requested by the patient (or if unable, by the patient's family).  Yes   Patient/family informed of their freedom to choose among providers that offer the needed level of care, that participate in Medicare, Medicaid or managed care program needed by the patient, have an available bed and are willing to accept the patient.  Yes   Patient/family informed of [facility]'s ownership interest in [facility] and [facility], as well as of the fact that they are under no obligation to receive care at these facilities.  PASRR submitted to EDS on       PASRR number received on       Existing PASRR number confirmed on 09/10/15     FL2 transmitted to all facilities in geographic area requested by pt/family on 09/10/15     FL2 transmitted to all facilities within larger geographic area on       Patient informed that his/her managed care company has contracts with or will negotiate with certain facilities, including the following:            Patient/family informed of bed offers received.  Patient chooses bed at       Physician recommends and patient chooses bed at      Patient to be transferred to   on  .  Patient to be transferred to facility by       Patient family notified on   of transfer.  Name of family member notified:        PHYSICIAN       Additional Comment:    _______________________________________________ Loralyn Freshwater, LCSW 09/10/2015, 11:55 AM

## 2015-09-10 NOTE — Telephone Encounter (Signed)
Patient is currently admitted to [facility] hospital. Please do not charge no show fee.

## 2015-09-10 NOTE — Progress Notes (Signed)
Pt placed on [facility] CPAP C-3. Pt tolerating well. CPAP plugged into red outlet. 1L O2 in line.

## 2015-09-10 NOTE — Evaluation (Signed)
Physical Therapy Evaluation Patient Details Name: Stephanie [name] MRN: [id number] DOB: 07/11/1950 Today's Date: 09/10/2015   History of Present Illness  65 yo F presented to ED for lethargy and low BS, found to be hypoglycemic. She also has had many recent falls with one resulting in a R humerus fx 1 wk ago. PMH includes bipolar affective disorder, CVA, aortic dissection, DM II poorly controlled and liver cirrhosis.  Clinical Impression  Pt demonstrated generalized weakness and highly limited mobility. She required max A for bed mobility with elevated HOB and mod A for STS. Stand pivot transfer attempted with pt being unable to complete and returned to sitting at EOB. She is unable to use a FWW due to R humerus fx which limites pt's stability, requiring increased assistance for mobility. Pt is unsafe to return home at this time due to severely limited mobility and STR is recommended to address deficits of strength, balance and gait. Pt will benefit from skilled PT services to increase functional I and mobility for progressing towards PLOF.     Follow Up Recommendations SNF    Equipment Recommendations  None recommended by PT    Recommendations for Other Services       Precautions / Restrictions Precautions Precautions: Fall Restrictions Weight Bearing Restrictions: No      Mobility  Bed Mobility Overal bed mobility: +2 for physical assistance;Needs Assistance Bed Mobility: Supine to Sit;Sit to Supine     Supine to sit: Max assist;HOB elevated Sit to supine: Max assist;HOB elevated   General bed mobility comments: + 2 for scooting to [facility]  Transfers Overall transfer level: Needs assistance Equipment used: Straight cane Transfers: Sit to/from Stand Sit to Stand: Mod assist;From elevated surface         General transfer comment: cues for set up and technique, difficulty without use of R UE, attempted pivot transfer with pt turning minimally and unable to make it completely  and returns to EOB  Ambulation/Gait             General Gait Details: NT due to poor standing and transfer ability  Stairs            Wheelchair Mobility    Modified Rankin (Stroke Patients Only)       Balance Overall balance assessment: Needs assistance;History of Falls Sitting-balance support: Single extremity supported;Feet supported Sitting balance-Leahy Scale: Poor Sitting balance - Comments: poor trunk stability Postural control: Posterior lean Standing balance support: Single extremity supported Standing balance-Leahy Scale: Poor Standing balance comment: requires min to mod A to maintain, very weak                             Pertinent Vitals/Pain Pain Assessment: No/denies pain    Home Living Family/patient expects to be discharged to:: Private residence Living Arrangements: Alone Available Help at Discharge: Family Type of Home: House Home Access: Stairs to enter Entrance Stairs-Rails: Left Entrance Stairs-Number of Steps: 4 Home Layout: One level Home Equipment: Walker - standard;Cane - single point      Prior Function Level of Independence: Independent with assistive device(s)         Comments: Pt reports limited ambulation with SPC. She doesn't leave her house much. I with basic ADLs and is very sedentary.     Hand Dominance        Extremity/Trunk Assessment   Upper Extremity Assessment: Generalized weakness;RUE deficits/detail RUE Deficits / Details: R humerus fx in sling RUE:  Unable to fully assess due to immobilization       Lower Extremity Assessment: Generalized weakness         Communication   Communication: No difficulties  Cognition Arousal/Alertness: Awake/alert Behavior During Therapy: Restless;Anxious Overall Cognitive Status: No family/caregiver present to determine baseline cognitive functioning (Can't recall the month.)                      General Comments General comments (skin  integrity, edema, etc.): R UE bruised, sling applied due to fx, R lower leg abrasions from fall    Exercises Other Exercises Other Exercises: B LE supine therex: ankle pumps, heel slides, hip abd slides with AAROM x10 each. Cues for proper technique and staying on task. Sits at EOB for 5 minutes without trunk supported for core strengthening.      Assessment/Plan    PT Assessment Patient needs continued PT services  PT Diagnosis Difficulty walking;Generalized weakness   PT Problem List Decreased strength;Decreased activity tolerance;Decreased balance;Decreased mobility;Decreased knowledge of use of DME;Decreased safety awareness;Obesity  PT Treatment Interventions DME instruction;Gait training;Stair training;Therapeutic activities;Therapeutic exercise;Balance training;Neuromuscular re-education;Patient/family education   PT Goals (Current goals can be found in the Care Plan section) Acute Rehab PT Goals Patient Stated Goal: to get stronger PT Goal Formulation: With patient Time For Goal Achievement: 09/24/15 Potential to Achieve Goals: Fair    Frequency Min 2X/week   Barriers to discharge Inaccessible home environment;Decreased caregiver support lives alone, steps to enter    Co-evaluation               End of Session Equipment Utilized During Treatment: Gait belt;Oxygen Activity Tolerance: Patient limited by fatigue Patient left: in bed;with call bell/phone within reach;with bed alarm set;with SCD's reapplied Nurse Communication: Mobility status    Functional Assessment Tool Used: clinical judgement; STS Functional Limitation: Mobility: Walking and moving around Mobility: Walking and Moving Around Current Status ([id number]): At least 60 percent but less than 80 percent impaired, limited or restricted Mobility: Walking and Moving Around Goal Status [phone]): At least 1 percent but less than 20 percent impaired, limited or restricted    Time: 0910-0934 PT Time Calculation  (min) (ACUTE ONLY): 24 min   Charges:   PT Evaluation $PT Eval High Complexity: 1 Procedure PT Treatments $Therapeutic Exercise: 8-22 mins   PT G Codes:   PT G-Codes **NOT FOR INPATIENT CLASS** Functional Assessment Tool Used: clinical judgement; STS Functional Limitation: Mobility: Walking and moving around Mobility: Walking and Moving Around Current Status ([id number]): At least 60 percent but less than 80 percent impaired, limited or restricted Mobility: Walking and Moving Around Goal Status [phone]): At least 1 percent but less than 20 percent impaired, limited or restricted    Neoma Laming, PT, DPT  09/10/2015, 10:24 AM [phone]

## 2015-09-11 DIAGNOSIS — K219 Gastro-esophageal reflux disease without esophagitis: Secondary | ICD-10-CM | POA: Diagnosis not present

## 2015-09-11 DIAGNOSIS — J4540 Moderate persistent asthma, uncomplicated: Secondary | ICD-10-CM | POA: Diagnosis not present

## 2015-09-11 DIAGNOSIS — S93402S Sprain of unspecified ligament of left ankle, sequela: Secondary | ICD-10-CM | POA: Diagnosis not present

## 2015-09-11 DIAGNOSIS — S42251S Displaced fracture of greater tuberosity of right humerus, sequela: Secondary | ICD-10-CM | POA: Diagnosis not present

## 2015-09-11 DIAGNOSIS — R278 Other lack of coordination: Secondary | ICD-10-CM | POA: Diagnosis not present

## 2015-09-11 DIAGNOSIS — Z743 Need for continuous supervision: Secondary | ICD-10-CM | POA: Diagnosis not present

## 2015-09-11 DIAGNOSIS — G629 Polyneuropathy, unspecified: Secondary | ICD-10-CM | POA: Diagnosis not present

## 2015-09-11 DIAGNOSIS — M19079 Primary osteoarthritis, unspecified ankle and foot: Secondary | ICD-10-CM | POA: Diagnosis not present

## 2015-09-11 DIAGNOSIS — E1149 Type 2 diabetes mellitus with other diabetic neurological complication: Secondary | ICD-10-CM | POA: Diagnosis not present

## 2015-09-11 DIAGNOSIS — F3130 Bipolar disorder, current episode depressed, mild or moderate severity, unspecified: Secondary | ICD-10-CM | POA: Diagnosis not present

## 2015-09-11 DIAGNOSIS — D649 Anemia, unspecified: Secondary | ICD-10-CM | POA: Diagnosis not present

## 2015-09-11 DIAGNOSIS — K766 Portal hypertension: Secondary | ICD-10-CM | POA: Diagnosis not present

## 2015-09-11 DIAGNOSIS — Z8711 Personal history of peptic ulcer disease: Secondary | ICD-10-CM | POA: Diagnosis not present

## 2015-09-11 DIAGNOSIS — E118 Type 2 diabetes mellitus with unspecified complications: Secondary | ICD-10-CM | POA: Diagnosis not present

## 2015-09-11 DIAGNOSIS — R531 Weakness: Secondary | ICD-10-CM | POA: Diagnosis not present

## 2015-09-11 DIAGNOSIS — M79676 Pain in unspecified toe(s): Secondary | ICD-10-CM | POA: Diagnosis not present

## 2015-09-11 DIAGNOSIS — Z23 Encounter for immunization: Secondary | ICD-10-CM | POA: Diagnosis not present

## 2015-09-11 DIAGNOSIS — Z79899 Other long term (current) drug therapy: Secondary | ICD-10-CM | POA: Diagnosis not present

## 2015-09-11 DIAGNOSIS — M25511 Pain in right shoulder: Secondary | ICD-10-CM | POA: Diagnosis not present

## 2015-09-11 DIAGNOSIS — Z8673 Personal history of transient ischemic attack (TIA), and cerebral infarction without residual deficits: Secondary | ICD-10-CM | POA: Diagnosis not present

## 2015-09-11 DIAGNOSIS — G4700 Insomnia, unspecified: Secondary | ICD-10-CM | POA: Diagnosis not present

## 2015-09-11 DIAGNOSIS — R0602 Shortness of breath: Secondary | ICD-10-CM | POA: Diagnosis not present

## 2015-09-11 DIAGNOSIS — K7581 Nonalcoholic steatohepatitis (NASH): Secondary | ICD-10-CM | POA: Diagnosis not present

## 2015-09-11 DIAGNOSIS — Z9181 History of falling: Secondary | ICD-10-CM | POA: Diagnosis not present

## 2015-09-11 DIAGNOSIS — M1990 Unspecified osteoarthritis, unspecified site: Secondary | ICD-10-CM | POA: Diagnosis not present

## 2015-09-11 DIAGNOSIS — M6281 Muscle weakness (generalized): Secondary | ICD-10-CM | POA: Diagnosis not present

## 2015-09-11 DIAGNOSIS — D696 Thrombocytopenia, unspecified: Secondary | ICD-10-CM | POA: Diagnosis not present

## 2015-09-11 DIAGNOSIS — M79673 Pain in unspecified foot: Secondary | ICD-10-CM | POA: Diagnosis not present

## 2015-09-11 DIAGNOSIS — R0600 Dyspnea, unspecified: Secondary | ICD-10-CM | POA: Diagnosis not present

## 2015-09-11 DIAGNOSIS — F419 Anxiety disorder, unspecified: Secondary | ICD-10-CM | POA: Diagnosis not present

## 2015-09-11 DIAGNOSIS — E119 Type 2 diabetes mellitus without complications: Secondary | ICD-10-CM | POA: Diagnosis not present

## 2015-09-11 DIAGNOSIS — B351 Tinea unguium: Secondary | ICD-10-CM | POA: Diagnosis not present

## 2015-09-11 DIAGNOSIS — I700 Atherosclerosis of aorta: Secondary | ICD-10-CM | POA: Diagnosis not present

## 2015-09-11 DIAGNOSIS — E11649 Type 2 diabetes mellitus with hypoglycemia without coma: Secondary | ICD-10-CM | POA: Diagnosis not present

## 2015-09-11 DIAGNOSIS — I10 Essential (primary) hypertension: Secondary | ICD-10-CM | POA: Diagnosis not present

## 2015-09-11 DIAGNOSIS — R262 Difficulty in walking, not elsewhere classified: Secondary | ICD-10-CM | POA: Diagnosis not present

## 2015-09-11 DIAGNOSIS — K7460 Unspecified cirrhosis of liver: Secondary | ICD-10-CM | POA: Diagnosis not present

## 2015-09-11 DIAGNOSIS — G4733 Obstructive sleep apnea (adult) (pediatric): Secondary | ICD-10-CM | POA: Diagnosis not present

## 2015-09-11 DIAGNOSIS — M797 Fibromyalgia: Secondary | ICD-10-CM | POA: Diagnosis not present

## 2015-09-11 DIAGNOSIS — E162 Hypoglycemia, unspecified: Secondary | ICD-10-CM | POA: Diagnosis not present

## 2015-09-11 DIAGNOSIS — J45909 Unspecified asthma, uncomplicated: Secondary | ICD-10-CM | POA: Diagnosis not present

## 2015-09-11 DIAGNOSIS — K760 Fatty (change of) liver, not elsewhere classified: Secondary | ICD-10-CM | POA: Diagnosis not present

## 2015-09-11 DIAGNOSIS — R52 Pain, unspecified: Secondary | ICD-10-CM | POA: Diagnosis not present

## 2015-09-11 DIAGNOSIS — S42254D Nondisplaced fracture of greater tuberosity of right humerus, subsequent encounter for fracture with routine healing: Secondary | ICD-10-CM | POA: Diagnosis not present

## 2015-09-11 DIAGNOSIS — G2581 Restless legs syndrome: Secondary | ICD-10-CM | POA: Diagnosis not present

## 2015-09-11 DIAGNOSIS — S42201D Unspecified fracture of upper end of right humerus, subsequent encounter for fracture with routine healing: Secondary | ICD-10-CM | POA: Diagnosis not present

## 2015-09-11 DIAGNOSIS — F3170 Bipolar disorder, currently in remission, most recent episode unspecified: Secondary | ICD-10-CM | POA: Diagnosis not present

## 2015-09-11 DIAGNOSIS — G9341 Metabolic encephalopathy: Secondary | ICD-10-CM | POA: Diagnosis not present

## 2015-09-11 LAB — GLUCOSE, CAPILLARY
Glucose-Capillary: 121 mg/dL — ABNORMAL HIGH (ref 65–99)
Glucose-Capillary: 149 mg/dL — ABNORMAL HIGH (ref 65–99)

## 2015-09-11 MED ORDER — LAMOTRIGINE 200 MG PO TABS: 200.0000 mg | ORAL_TABLET | Freq: Every day | ORAL | Status: DC

## 2015-09-11 MED ORDER — INSULIN ASPART 100 UNIT/ML ~~LOC~~ SOLN
0.0000 [IU] | Freq: Every day | SUBCUTANEOUS | Status: DC
Start: 2015-09-11 — End: 2015-09-14

## 2015-09-11 MED ORDER — INSULIN ASPART 100 UNIT/ML ~~LOC~~ SOLN
0.0000 [IU] | Freq: Three times a day (TID) | SUBCUTANEOUS | Status: DC
Start: 2015-09-11 — End: 2015-09-14

## 2015-09-11 MED ORDER — OXYCODONE HCL 5 MG PO TABS: 5.0000 mg | ORAL_TABLET | Freq: Four times a day (QID) | ORAL | Status: DC | PRN

## 2015-09-11 MED ORDER — LAMOTRIGINE 100 MG PO TABS: 100.0000 mg | ORAL_TABLET | Freq: Every morning | ORAL | Status: DC

## 2015-09-11 NOTE — Discharge Summary (Signed)
Stephanie [name], is a 65 y.o. female  DOB 24-Sep-1950  MRN [id number].  Admission date:  09/09/2015  Admitting Physician  Demetrios Loll, MD  Discharge Date:  09/11/2015   Primary MD  [name], MD  Recommendations for primary care physician for things to follow:   Recommendation follow up with primary doctor in 1 week  Follow up with Dr. Mack Guise from orthopedic in 2 weeks   Admission Diagnosis  Hypoglycemia [E16.2] Generalized weakness [R53.1]   Discharge Diagnosis  Hypoglycemia [E16.2] Generalized weakness [R53.1]    Principal Problem:   Hypoglycemia      Past Medical History  Diagnosis Date  . Adenomatous colon polyp 08/27/2014    Polyps x 3  . Insomnia   . RLS (restless legs syndrome)   . Colon polyp   . Incontinence   . H/O: rheumatic fever   . Hyperlipidemia   . Unspecified disorders of nervous system   . Bipolar affective disorder ([place])     h/o  . H/O: CVA (cardiovascular accident)     TIA  . Fatty liver 04/09/08    found in abd CT  . Shortness of breath   . Obstructive sleep apnea     not using CPAP, last sleep study 2.5 years ago, per pt doctor is aware  . Blood transfusion 2000  . GERD (gastroesophageal reflux disease)   . Arthritis   . 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 Asthma   . Anxiety   . Depression   . Stroke [facility])     TIA- 2002  . Ulcer   . Diabetes mellitus without complication ([place])   . Anemia   . Blood transfusion without reported diagnosis   . Sleep apnea   . Platelets decreased ([place])   . 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 Chronic abdominal pain     abdominal wall pain    Past Surgical History  Procedure Laterality Date  . Back surgery      x 45  . Cholecystectomy    . Rotator cuff repair      left  . Abdominal hysterectomy      total  . Lumbar fusion  10/09   . Hemorroidectomy      and colon polyp removed  . Abdominal exploration surgery    . Posterior cervical fusion/foraminotomy    . Eye surgery      bilat  . Lumbar wound debridement  05/09/2011    Procedure: LUMBAR WOUND DEBRIDEMENT;  Surgeon: Dahlia Bailiff;  Location: [place];  Service: Orthopedics;  Laterality: N/A;  IRRIGATION AND DEBRIDEMENT SPINAL WOUND  . Axillary artery cannulation via 8-mm hemashield graft, median sternotomy, extracorporeal circulation with deep hypothermic circulatory arrest, repair of  aortic dessection  01/23/2009    Dr Arlyce Dice  . Cataract      both eyes  . Colonoscopy    . Liver biopsy  04/10/2012    Procedure: LIVER BIOPSY;  Surgeon: Inda Castle, MD;  Location: [facility];  Service: Endoscopy;  Laterality: N/A;  ultrasound to mark order for abd limited/liver to be marked to be sent by [email or web address]        History of present illness and  Hospital Course:     Kindly see H&P for history of present illness and admission details, please review complete Labs, Consult reports and Test reports for all details in brief  HPI  from the history and physical done on the  day of admission 65 year old female patient with history of hypertension, diabetes mellitus, RLS, depression, bipolar disorder, recent right humerus fracture on a sliding comes in because of hypoglycemia with altered mental status. Patient blood glucose was 40 when EMS arrived at their home. Patient was received D50, brought to the hospital. Patient admitted to medical unit because of hypoglycemia and also altered mental status.   Hospital Course  #1 metabolic encephalopathy due to hypoglycemia: Her glucose was 40  At home,Patient received D10 IV fluids, hypoglycemia resolved. Patient metformin, glipizide are stopped. Patient was taking tramadol for her right shoulder pain due to right humerus fracture, patient also was taking her psychiatric medicines including the requip,, Lamictal, Abilify. 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 And also had a hypoglycemia on March 14 and sugar was 40 at that time and she was seen in the emergency room.  2. #2 diabetes mellitus type 2 with hypoglycemia: Patient metformin, glipizide are stopped Right now she is on NovoLog with sliding scale coverage.. Continue that at discharge.   #3 deconditioning, right humerus fracture: Patient seen by physical therapy they recommended SNF placement. Family chose [facility], patient will go there today.  #4 .Recent right humerus fracture: Patient was seen by Dr. Mack Guise, she  Was  seen in the emergency room on March 28  . Patient to continue the sling and follow with orthopedic as an outpatient closely.  #5 history of fibromyalgia, depression, anxiety, restless leg syndrome: Patient is on Abilify, Lamictal, Klonopin, requip. #6. sleep apnea continue CPAP at night.  Discharge Condition: stable   Follow UP  Follow-up Information    Follow up with [facility] .   Specialty:  [place] information:   [address] [place] [phone]        Discharge Instructions  and  Discharge Medications        Medication List    STOP taking these medications        diazepam 5 MG tablet  Commonly known as:  VALIUM     glipiZIDE 5 MG tablet  Commonly known as:  GLUCOTROL     metFORMIN 500 MG tablet  Commonly known as:  GLUCOPHAGE     traMADol 50 MG tablet  Commonly known as:  ULTRAM      TAKE these medications        ARIPiprazole 20 MG tablet  Commonly known as:  ABILIFY  Take 20 mg by mouth daily.     budesonide-formoterol 160-4.5 MCG/ACT inhaler  Commonly known as:  SYMBICORT  Inhale 2 puffs into the lungs 2 (two) times daily.     clonazePAM 0.5 MG tablet  Commonly known as:  KLONOPIN  Take 0.5 mg by mouth 2 (two) times daily as needed for anxiety.      dicyclomine 10 MG capsule  Commonly known as:  BENTYL  Take 1 capsule (10 mg total) by mouth 3 (three) times daily before meals.     docusate sodium 100 MG capsule  Commonly known as:  COLACE  Take 100 mg by mouth 2 (two) times daily.     HYDROcodone-acetaminophen 5-325 MG tablet  Commonly known as:  NORCO/VICODIN  Take 1-2 tablets by mouth every 6 (six) hours as needed for moderate pain.     insulin aspart 100 UNIT/ML injection  Commonly known as:  novoLOG  Inject 0-9 Units into the skin 3 (three) times daily with  meals.     insulin aspart 100 UNIT/ML injection  Commonly known as:  novoLOG  Inject 0-5 Units into the skin at bedtime.     lamoTRIgine 200 MG tablet  Commonly known as:  LAMICTAL  Take 1 tablet (200 mg total) by mouth at bedtime.     lamoTRIgine 100 MG tablet  Commonly known as:  LAMICTAL  Take 1 tablet (100 mg total) by mouth every morning.     metoCLOPramide 10 MG tablet  Commonly known as:  REGLAN  Take 1 tablet (10 mg total) by mouth 4 (four) times daily -  before meals and at bedtime.     mirtazapine 15 MG tablet  Commonly known as:  REMERON  Take 15 mg by mouth at bedtime.     nystatin 100000 UNIT/GM Powd  Apply 1 g topically 2 (two) times daily as needed (for rash).     omeprazole 20 MG capsule  Commonly known as:  PRILOSEC  Take 40 mg by mouth daily.     ondansetron 4 MG disintegrating tablet  Commonly known as:  ZOFRAN-ODT  Take 4 mg by mouth every 8 (eight) hours as needed for nausea or vomiting.     oxyCODONE 5 MG immediate release tablet  Commonly known as:  ROXICODONE  Take 1 tablet (5 mg total) by mouth every 6 (six) hours as needed for moderate pain. Do not drive while taking this medication.     pregabalin 50 MG capsule  Commonly known as:  LYRICA  Take 1 capsule (50 mg total) by mouth 3 (three) times daily.     rOPINIRole 3 MG tablet  Commonly known as:  REQUIP  Take 3 mg by mouth at bedtime.     simvastatin 10 MG tablet   Commonly known as:  ZOCOR  Take 10 mg by mouth at bedtime.     tolterodine 2 MG 24 hr capsule  Commonly known as:  DETROL LA  Take 1 capsule (2 mg total) by mouth daily.     zolpidem 5 MG tablet  Commonly known as:  AMBIEN  Take 5 mg by mouth at bedtime.          Diet and Activity recommendation: See Discharge Instructions above   Consults obtained -PT   Major procedures and Radiology Reports - PLEASE review detailed and final reports for all details, in brief -     Dg Shoulder Right  09/01/2015  CLINICAL DATA:  Right shoulder pain post fall at grocery store this morning EXAM: RIGHT SHOULDER - 2+ VIEW COMPARISON:  None. FINDINGS: Four views of the right shoulder submitted. Degenerative changes are noted acromioclavicular joint. Dystrophic calcifications are noted just above the acromioclavicular joint. There is probable subtle nondisplaced fracture of the right greater humeral tuberosity. IMPRESSION: Probable subtle nondisplaced fracture of greater humeral tuberosity. Degenerative changes AC joint. Electronically Signed   By: Lahoma Crocker M.D.   On: 09/01/2015 13:37   Ct Chest W Contrast  09/01/2015  CLINICAL DATA:  [place] today in [employer].  Tripped over a cart. EXAM: CT CHEST, ABDOMEN, AND PELVIS WITH CONTRAST TECHNIQUE: Multidetector CT imaging of the chest, abdomen and pelvis was performed following the standard protocol during bolus administration of intravenous contrast. CONTRAST:  187m ISOVUE-300 IOPAMIDOL (ISOVUE-300) INJECTION 61% COMPARISON:  07/14/2015 FINDINGS: CT CHEST FINDINGS Mediastinum/Lymph Nodes: No breast masses or chest wall hematoma. No supraclavicular or axillary adenopathy. The thyroid gland appears normal. The heart is normal in size. No pericardial effusion. No mediastinal  or hilar mass or adenopathy or hematoma. The aorta is normal in caliber. No dissection. Scattered atherosclerotic calcifications. Coronary artery calcifications are no new line the  esophagus is grossly normal. Lungs/Pleura: Exam is somewhat limited by breathing motion artifact. There are areas of subpleural atelectasis and areas of mild peribronchial thickening. A few patchy E 8 feet sub solid nodular densities are noted. The largest measures 12 mm in the right upper lobe on image number 25. No pulmonary contusion, pneumothorax or pleural effusion. Musculoskeletal: Cervical spine fusion hardware noted. Pole a spinal cord stimulator noted in the thoracic spine. In the sternum is intact. The thoracic vertebral bodies are normally aligned. No acute bony findings. No definite rib fractures. CT ABDOMEN PELVIS FINDINGS Hepatobiliary: Stable cirrhotic changes involving the liver. No focal hepatic lesions or acute hepatic injury. No biliary dilatation. The gallbladder is surgically absent. No common bile duct dilatation. Pancreas: No mass, inflammation or acute injury. Spleen: Stable mild splenomegaly.  No acute injury. Adrenals/Urinary Tract: The adrenal glands and kidneys are unremarkable and stable. No acute injury. Stomach/Bowel: The stomach, duodenum, small bowel and colon are grossly normal without oral contrast. No inflammatory changes, mass lesions or obstructive findings. No acute injury. No free fluid or free air. Vascular/Lymphatic: Advanced atherosclerotic calcifications involving the aorta and branch vessel ostia. No aneurysm or dissection. The major venous structures are patent. No mesenteric or retroperitoneal mass, adenopathy or hematoma. Reproductive: The uterus and ovaries are surgically absent. Other: No pelvic mass or adenopathy. No free pelvic fluid collections. The bladder is normal. No inguinal mass or adenopathy. No abdominal wall hernia or subcutaneous lesions. Surgical changes from prior spinal surgery. Musculoskeletal: No acute bony findings. Stable surgical changes involving the lumbar spine. No acute fracture. The bony pelvis is intact. The pubic symphysis and SI joints  appear normal. Both hips are normally located. No hip fracture. IMPRESSION: 1. No acute injury involving the chest, abdomen or pelvis. 2. Mild emphysematous changes in the lungs and patchy airspace nodules. This could reflect inflammatory change but recommend followup noncontrast chest CT in 3 months. 3. Stable cirrhotic changes involving the liver with portal venous hypertension and portal venous collaterals. Stable splenomegaly. 4. No acute bony findings. Electronically Signed   By: Marijo Sanes M.D.   On: 09/01/2015 14:36   Ct Cervical Spine Wo Contrast  09/01/2015  CLINICAL DATA:  Recent fall with neck pain, initial encounter EXAM: CT CERVICAL SPINE WITHOUT CONTRAST TECHNIQUE: Multidetector CT imaging of the cervical spine was performed without intravenous contrast. Multiplanar CT image reconstructions were also generated. COMPARISON:  03/21/2015 FINDINGS: Seven cervical segments are well visualized. Changes of prior fusion from C5-C7 are seen. Facet hypertrophic changes are noted at all levels with varying degrees of neural foraminal narrowing. No acute fracture or acute facet abnormality is noted. The surrounding soft tissues are within normal limits. IMPRESSION: Multilevel degenerative changes and postoperative changes. No acute bony abnormality is noted. Electronically Signed   By: [name] Catalina M.D.   On: 09/01/2015 13:14   Ct Abdomen Pelvis W Contrast  09/01/2015  CLINICAL DATA:  [place] today in [employer].  Tripped over a cart. EXAM: CT CHEST, ABDOMEN, AND PELVIS WITH CONTRAST TECHNIQUE: Multidetector CT imaging of the chest, abdomen and pelvis was performed following the standard protocol during bolus administration of intravenous contrast. CONTRAST:  131m ISOVUE-300 IOPAMIDOL (ISOVUE-300) INJECTION 61% COMPARISON:  07/14/2015 FINDINGS: CT CHEST FINDINGS Mediastinum/Lymph Nodes: No breast masses or chest wall hematoma. No supraclavicular or axillary adenopathy. The thyroid  gland appears normal.  The heart is normal in size. No pericardial effusion. No mediastinal or hilar mass or adenopathy or hematoma. The aorta is normal in caliber. No dissection. Scattered atherosclerotic calcifications. Coronary artery calcifications are no new line the esophagus is grossly normal. Lungs/Pleura: Exam is somewhat limited by breathing motion artifact. There are areas of subpleural atelectasis and areas of mild peribronchial thickening. A few patchy E 8 feet sub solid nodular densities are noted. The largest measures 12 mm in the right upper lobe on image number 25. No pulmonary contusion, pneumothorax or pleural effusion. Musculoskeletal: Cervical spine fusion hardware noted. Pole a spinal cord stimulator noted in the thoracic spine. In the sternum is intact. The thoracic vertebral bodies are normally aligned. No acute bony findings. No definite rib fractures. CT ABDOMEN PELVIS FINDINGS Hepatobiliary: Stable cirrhotic changes involving the liver. No focal hepatic lesions or acute hepatic injury. No biliary dilatation. The gallbladder is surgically absent. No common bile duct dilatation. Pancreas: No mass, inflammation or acute injury. Spleen: Stable mild splenomegaly.  No acute injury. Adrenals/Urinary Tract: The adrenal glands and kidneys are unremarkable and stable. No acute injury. Stomach/Bowel: The stomach, duodenum, small bowel and colon are grossly normal without oral contrast. No inflammatory changes, mass lesions or obstructive findings. No acute injury. No free fluid or free air. Vascular/Lymphatic: Advanced atherosclerotic calcifications involving the aorta and branch vessel ostia. No aneurysm or dissection. The major venous structures are patent. No mesenteric or retroperitoneal mass, adenopathy or hematoma. Reproductive: The uterus and ovaries are surgically absent. Other: No pelvic mass or adenopathy. No free pelvic fluid collections. The bladder is normal. No inguinal mass or adenopathy. No abdominal wall  hernia or subcutaneous lesions. Surgical changes from prior spinal surgery. Musculoskeletal: No acute bony findings. Stable surgical changes involving the lumbar spine. No acute fracture. The bony pelvis is intact. The pubic symphysis and SI joints appear normal. Both hips are normally located. No hip fracture. IMPRESSION: 1. No acute injury involving the chest, abdomen or pelvis. 2. Mild emphysematous changes in the lungs and patchy airspace nodules. This could reflect inflammatory change but recommend followup noncontrast chest CT in 3 months. 3. Stable cirrhotic changes involving the liver with portal venous hypertension and portal venous collaterals. Stable splenomegaly. 4. No acute bony findings. Electronically Signed   By: Marijo Sanes M.D.   On: 09/01/2015 14:36   Dg Chest Portable 1 View  09/09/2015  CLINICAL DATA:  65 year old female with chest pain and lethargy. Initial encounter. EXAM: PORTABLE CHEST 1 VIEW COMPARISON:  CT chest abdomen and pelvis 09/01/2015 and earlier. FINDINGS: Portable AP upright view at 1531 hours. Lordotic view. Chronic lower thoracic spinal stimulator and lower cervical ACDF hardware. Stable cardiac size and mediastinal contours. No pneumothorax, pleural effusion or consolidation identified portably. Increased pulmonary vascular congestion, no overt edema suspected at this time. Visualized tracheal air column is within normal limits. IMPRESSION: Increased pulmonary vascularity without overt edema. Electronically Signed   By: Genevie Ann M.D.   On: 09/09/2015 15:50   Dg Humerus Right  09/01/2015  CLINICAL DATA:  Pain following fall earlier today EXAM: RIGHT HUMERUS - 2+ VIEW COMPARISON:  None. FINDINGS: Frontal and lateral views were obtained. There is a fracture of the proximal right humeral metaphysis with subtle impaction at the fracture site. No other fracture. No dislocation. The joint spaces appear unremarkable. Note that there is calcification in the superior  acromioclavicular joint on the right. IMPRESSION: Impacted fracture proximal humeral metaphysis with alignment near anatomic.  No other fracture. No dislocation. Calcification is noted in the superior aspect of the right acromioclavicular joint. Electronically Signed   By: Lowella Grip III M.D.   On: 09/01/2015 13:36    Micro Results    No results found for this or any previous visit (from the past 240 hour(s)).     Today   Subjective:   Stephanie [name] today has no headache,no chest abdominal pain,no new weakness tingling or numbness, feels much better wants to go home today.   Objective:   Blood pressure 125/50, pulse 87, temperature 97.7 F (36.5 C), temperature source Oral, resp. rate 18, height 5' 3"  (1.6 m), weight 103.284 kg (227 lb 11.2 oz), SpO2 96 %.   Intake/Output Summary (Last 24 hours) at 09/11/15 1007 Last data filed at 09/11/15 0635  Gross per 24 hour  Intake    240 ml  Output   2450 ml  Net  -2210 ml    Exam Awake Alert, Oriented x 3, No new F.N deficits, Normal affect [place].AT,PERRAL Supple Neck,No JVD, No cervical lymphadenopathy appriciated.  Symmetrical Chest wall movement, Good air movement bilaterally, CTAB RRR,No Gallops,Rubs or new Murmurs, No Parasternal Heave +ve B.Sounds, Abd Soft, Non tender, No organomegaly appriciated, No rebound -guarding or rigidity. No Cyanosis, Clubbing or edema, No new Rash or bruise Right shoulder is in the sling. Data Review   CBC w Diff: Lab Results  Component Value Date   WBC 3.6 09/09/2015   WBC 4.7 09/29/2014   HGB 9.6* 09/09/2015   HGB 11.7* 09/29/2014   HCT 30.0* 09/09/2015   HCT 34.7* 09/29/2014   PLT 123* 09/09/2015   PLT 102* 09/29/2014   LYMPHOPCT 17 09/01/2015   LYMPHOPCT 18.9 09/29/2014   MONOPCT 9 09/01/2015   MONOPCT 8.6 09/29/2014   EOSPCT 1 09/01/2015   EOSPCT 5.0 09/29/2014   BASOPCT 0 09/01/2015   BASOPCT 1.2 09/29/2014    CMP: Lab Results  Component Value Date   NA 136 09/09/2015    NA 138 05/03/2014   K 3.6 09/09/2015   K 3.9 05/03/2014   CL 106 09/09/2015   CL 103 05/03/2014   CO2 25 09/09/2015   CO2 28 05/03/2014   BUN 14 09/09/2015   BUN 11 05/03/2014   CREATININE 0.59 09/09/2015   CREATININE 0.73 09/29/2014   PROT 6.7 09/09/2015   PROT 7.0 12/26/2013   ALBUMIN 3.2* 09/09/2015   ALBUMIN 2.8* 12/26/2013   BILITOT 0.6 09/09/2015   BILITOT 0.6 12/26/2013   ALKPHOS 122 09/09/2015   ALKPHOS 151* 12/26/2013   AST 37 09/09/2015   AST 50* 12/26/2013   ALT 17 09/09/2015   ALT 41 12/26/2013  .   Total Time in preparing paper work, data evaluation and todays exam - 13 minutes  Carnie Bruemmer M.D on 09/11/2015 at 10:07 AM    Note: This dictation was prepared with Dragon dictation along with smaller phrase technology. Any transcriptional errors that result from this process are unintentional.

## 2015-09-11 NOTE — Progress Notes (Signed)
Prescription found for oxycodone and patient had already discharged to facility. Called and spoke with Vira Agar at [facility] that was caring for the patient to verify if patient they had received the hard prescription in the packet. I was informed by Vira Agar that they did not have hard prescription for the patient but they would obtain one from the facility.

## 2015-09-11 NOTE — Progress Notes (Signed)
Report called to [facility] place, spoke with Hoyle Sauer, Therapist, sports.  Pt ready for transport via ems.

## 2015-09-11 NOTE — Progress Notes (Signed)
Discharge Note: LCSW completed EMS- Medical necessity form. Called report # [id number] to [facility]. Patients fl2 and discharge summary and therapy notes were sent via the hub to [facility]. Patient will be in room 705 as per [facility].  [employer] LCSW [phone]

## 2015-09-11 NOTE — Progress Notes (Signed)
Spoke with Ander Purpura, [facility] rep at [phone], to notify of non-emergent EMS transport.  Auth notification reference given as [id number].   Service date range good from 09/11/15 - 12/10/15.   Gap exception requested to determine if services can be considered at an in-network level.

## 2015-09-14 ENCOUNTER — Encounter: Payer: Self-pay | Admitting: Internal Medicine

## 2015-09-14 ENCOUNTER — Non-Acute Institutional Stay (SKILLED_NURSING_FACILITY): Payer: Medicare Other | Admitting: Internal Medicine

## 2015-09-14 DIAGNOSIS — D649 Anemia, unspecified: Secondary | ICD-10-CM | POA: Diagnosis not present

## 2015-09-14 DIAGNOSIS — E785 Hyperlipidemia, unspecified: Secondary | ICD-10-CM | POA: Diagnosis not present

## 2015-09-14 DIAGNOSIS — D696 Thrombocytopenia, unspecified: Secondary | ICD-10-CM | POA: Diagnosis not present

## 2015-09-14 DIAGNOSIS — M797 Fibromyalgia: Secondary | ICD-10-CM | POA: Diagnosis not present

## 2015-09-14 DIAGNOSIS — G4700 Insomnia, unspecified: Secondary | ICD-10-CM | POA: Diagnosis not present

## 2015-09-14 DIAGNOSIS — K5900 Constipation, unspecified: Secondary | ICD-10-CM

## 2015-09-14 DIAGNOSIS — E119 Type 2 diabetes mellitus without complications: Secondary | ICD-10-CM

## 2015-09-14 DIAGNOSIS — R531 Weakness: Secondary | ICD-10-CM | POA: Diagnosis not present

## 2015-09-14 DIAGNOSIS — E1169 Type 2 diabetes mellitus with other specified complication: Secondary | ICD-10-CM

## 2015-09-14 DIAGNOSIS — E669 Obesity, unspecified: Secondary | ICD-10-CM

## 2015-09-14 DIAGNOSIS — F3170 Bipolar disorder, currently in remission, most recent episode unspecified: Secondary | ICD-10-CM | POA: Diagnosis not present

## 2015-09-14 DIAGNOSIS — S42301S Unspecified fracture of shaft of humerus, right arm, sequela: Secondary | ICD-10-CM | POA: Diagnosis not present

## 2015-09-14 DIAGNOSIS — G2581 Restless legs syndrome: Secondary | ICD-10-CM | POA: Diagnosis not present

## 2015-09-14 DIAGNOSIS — K219 Gastro-esophageal reflux disease without esophagitis: Secondary | ICD-10-CM | POA: Diagnosis not present

## 2015-09-14 NOTE — Progress Notes (Signed)
LOCATION: [facility]  PCP: [name], MD   Code Status: Full Code  Goals of care: Advanced Directive information Advanced Directives 09/09/2015  Does patient have an advance directive? No  Type of Advance Directive -  Does patient want to make changes to advanced directive? -  Copy of advanced directive(s) in chart? -  Would patient like information on creating an advanced directive? Yes Higher education careers adviser given       Extended Emergency Contact Information Primary Emergency Contact: Esque,Tammie Address: [place], [place] [address] [place] Phone: [phone] Mobile Phone: [phone] Relation: Other Secondary Emergency Contact: Patton,Edna Address: [address]          [place], [place] [address] [place] Phone: [phone] Relation: Friend   Allergies  Allergen Reactions  . Morphine And Related Other (See Comments)    Reaction:  Hypotension  . Doxycycline Hives, Swelling and Other (See Comments)    Reaction:  Facial swelling     Chief Complaint  Patient presents with  . New Admit To SNF    New Admission     HPI:  Patient is a 65 y.o. female seen today for short term rehabilitation post hospital admission from 09/09/15-09/11/15 with hypoglycemia and generalized weakness. She received iv dextrose and her oral hypoglycemics were discontinued. She was placed on SSI. She has history of recent right humerus fracture and has a sling to her right arm. She is seen in her room today. She complaints of pain not being under control with her pain regimen. She has PMH of fibromyalgia, depression, RLS, HTN among others.   Review of Systems:  Constitutional: Negative for fever, chills, diaphoresis. Feels very weak and tired. HENT: Negative for headache, congestion, nasal discharge. Eyes: Negative for blurred vision, double vision and discharge.  Respiratory: Negative for cough, shortness of breath and wheezing.    Cardiovascular: Negative for chest pain, palpitations, leg swelling.  Gastrointestinal: Negative for heartburn, nausea, vomiting, abdominal pain, loss of appetite. Last bowel movement was yesterday. Genitourinary: Negative for dysuria and flank pain.  Musculoskeletal: Negative for back pain, fall in the facility.  Skin: Negative for itching, rash.  Neurological: Negative for weakness.  Positive for dizziness. Psychiatric/Behavioral: Negative for anxiety, insomnia and memory loss. Positive for depression but is stable with her medication.   Past Medical History  Diagnosis Date  . Adenomatous colon polyp 08/27/2014    Polyps x 3  . Insomnia   . RLS (restless legs syndrome)   . Colon polyp   . Incontinence   . H/O: rheumatic fever   . Hyperlipidemia   . Unspecified disorders of nervous system   . Bipolar affective disorder ([facility])     h/o  . H/O: CVA (cardiovascular accident)     TIA  . Fatty liver 04/09/08    found in abd CT  . Shortness of breath   . Obstructive sleep apnea     not using CPAP, last sleep study 2.5 years ago, per pt doctor is aware  . Blood transfusion 2000  . GERD (gastroesophageal reflux disease)   . Arthritis   . 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 Asthma   . Anxiety   . Depression   . Stroke [facility])     TIA- 2002  . Ulcer   . Diabetes mellitus without complication ([facility])   . Anemia   .  Blood transfusion without reported diagnosis   . Sleep apnea   . Platelets decreased ([facility])   . 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 Chronic abdominal pain     abdominal wall pain   Past Surgical History  Procedure Laterality Date  . Back surgery      x 45  . Cholecystectomy    . Rotator cuff repair      left  . Abdominal hysterectomy      total  . Lumbar fusion  10/09  . Hemorroidectomy      and colon polyp removed  . Abdominal exploration surgery    . Posterior cervical fusion/foraminotomy    . Eye surgery      bilat  . Lumbar wound debridement   05/09/2011    Procedure: LUMBAR WOUND DEBRIDEMENT;  Surgeon: Dahlia Bailiff;  Location: [facility];  Service: Orthopedics;  Laterality: N/A;  IRRIGATION AND DEBRIDEMENT SPINAL WOUND  . Axillary artery cannulation via 8-mm hemashield graft, median sternotomy, extracorporeal circulation with deep hypothermic circulatory arrest, repair of  aortic dessection  01/23/2009    Dr Arlyce Dice  . Cataract      both eyes  . Colonoscopy    . Liver biopsy  04/10/2012    Procedure: LIVER BIOPSY;  Surgeon: Inda Castle, MD;  Location: [facility];  Service: Endoscopy;  Laterality: N/A;  ultrasound to mark order for abd limited/liver to be marked to be sent by [email or web address]    Social History:   reports that she quit smoking about 2 years ago. Her smoking use included Cigarettes. She has a 75 pack-year smoking history. She has never used smokeless tobacco. She reports that she does not drink alcohol or use illicit drugs.  Family History  Problem Relation Age of Onset  . Colon cancer Neg Hx   . Esophageal cancer Neg Hx   . Rectal cancer Neg Hx   . Breast cancer Mother   . Lung cancer Mother   . Stomach cancer Father   . Diabetes      4 aunts, and 1 uncle    Medications:   Medication List       This list is accurate as of: 09/14/15 12:08 PM.  Always use your most recent med list.               ARIPiprazole 20 MG tablet  Commonly known as:  ABILIFY  Take 20 mg by mouth daily.     budesonide-formoterol 160-4.5 MCG/ACT inhaler  Commonly known as:  SYMBICORT  Inhale 2 puffs into the lungs 2 (two) times daily.     clonazePAM 0.5 MG tablet  Commonly known as:  KLONOPIN  Take 0.5 mg by mouth 2 (two) times daily as needed for anxiety.     dicyclomine 10 MG capsule  Commonly known as:  BENTYL  Take 1 capsule (10 mg total) by mouth 3 (three) times daily before meals.     docusate sodium 100 MG capsule  Commonly known as:  COLACE  Take 100 mg by mouth 2 (two) times daily.      HYDROcodone-acetaminophen 5-325 MG tablet  Commonly known as:  NORCO/VICODIN  Take 1-2 tablets by mouth every 6 (six) hours as needed for moderate pain.     insulin lispro 100 UNIT/ML injection  Commonly known as:  HUMALOG  Inject into the skin 3 (three) times daily before meals. 150-200= 0 units, 201-250= 5 units, 251-300= 8 units, 301-350= 9 units, >350 CALL MD  lamoTRIgine 200 MG tablet  Commonly known as:  LAMICTAL  Take 1 tablet (200 mg total) by mouth at bedtime.     lamoTRIgine 100 MG tablet  Commonly known as:  LAMICTAL  Take 1 tablet (100 mg total) by mouth every morning.     metoCLOPramide 10 MG tablet  Commonly known as:  REGLAN  Take 1 tablet (10 mg total) by mouth 4 (four) times daily -  before meals and at bedtime.     mirtazapine 15 MG tablet  Commonly known as:  REMERON  Take 15 mg by mouth at bedtime.     nystatin 100000 UNIT/GM Powd  Apply 1 g topically 2 (two) times daily as needed (for rash).     omeprazole 20 MG capsule  Commonly known as:  PRILOSEC  Take 40 mg by mouth daily.     ondansetron 4 MG disintegrating tablet  Commonly known as:  ZOFRAN-ODT  Take 4 mg by mouth every 8 (eight) hours as needed for nausea or vomiting.     pregabalin 50 MG capsule  Commonly known as:  LYRICA  Take 1 capsule (50 mg total) by mouth 3 (three) times daily.     rOPINIRole 3 MG tablet  Commonly known as:  REQUIP  Take 3 mg by mouth at bedtime.     simvastatin 10 MG tablet  Commonly known as:  ZOCOR  Take 10 mg by mouth at bedtime.     tolterodine 2 MG tablet  Commonly known as:  DETROL  Take 2 mg by mouth daily.     zolpidem 5 MG tablet  Commonly known as:  AMBIEN  Take 5 mg by mouth at bedtime.        Immunizations: Immunization History  Administered Date(s) Administered  . Influenza Split 03/05/2013  . Influenza Whole 03/07/2007, 02/26/2008, 02/10/2010  . Influenza,inj,Quad PF,36+ Mos 02/07/2014, 02/11/2015  . Pneumococcal Conjugate-13  09/10/2015  . Pneumococcal Polysaccharide-23 04/01/2009  . Td 12/16/2008  . Zoster 12/13/2010     Physical Exam: Filed Vitals:   09/14/15 1147  BP: 107/60  Pulse: 82  Temp: 98.2 F (36.8 C)  TempSrc: Oral  Resp: 18  Height: 5' 3"  (1.6 m)  Weight: 227 lb 11.2 oz (103.284 kg)  SpO2: 96%   Body mass index is 40.35 kg/(m^2).  General- elderly female, morbidly obese, in no acute distress Head- normocephalic, atraumatic Nose- no maxillary or frontal sinus tenderness, no nasal discharge Throat- moist mucus membrane, has upper and lower dentures  Eyes- PERRLA, EOMI, no pallor, no icterus, no discharge, normal conjunctiva, normal sclera Neck- no cervical lymphadenopathy Cardiovascular- normal s1,s2, no murmur Respiratory- bilateral clear to auscultation, no wheeze, no rhonchi, no crackles, no use of accessory muscles Abdomen- bowel sounds present, soft, non tender Musculoskeletal- able to move all 4 extremities, right arm in sling, able to move her fingers, good radial pulse + Neurological- alert and oriented to person, place and time Skin- warm and dry, scab to right shin Psychiatry- normal mood and affect    Labs reviewed: Basic Metabolic Panel:  Recent Labs  08/18/15 1630 09/01/15 1148 09/09/15 09/09/15 1546  NA 139 138 136* 136  K 3.7 3.8  --  3.6  CL 107 112*  --  106  CO2 25 18*  --  25  GLUCOSE 77 136*  --  119*  BUN 12 10 14 14   CREATININE 0.64 0.70 0.6 0.59  CALCIUM 9.0 8.5*  --  8.6*   Liver Function Tests:  Recent  Labs  07/14/15 1217 08/18/15 1630 09/09/15 1546  AST 43* 54* 37  ALT 26 25 17   ALKPHOS 198* 174* 122  BILITOT 0.7 0.6 0.6  PROT 7.7 7.5 6.7  ALBUMIN 3.5 3.6 3.2*    Recent Labs  05/11/15 1524 05/12/15 1228 07/14/15 1217  LIPASE 25.0 25 21   No results for input(s): AMMONIA in the last 8760 hours. CBC:  Recent Labs  05/11/15 0923  08/18/15 1630 09/01/15 1148 09/09/15 09/09/15 1546  WBC 4.9  < > 5.5 5.3 3.6 3.6    NEUTROABS 3.3  --  4.3 3.9  --   --   HGB 12.1  < > 11.3* 10.8*  --  9.6*  HCT 37.0  < > 35.5 33.9*  --  30.0*  MCV 88.5  < > 83.8 84.2  --  85.4  PLT 146*  < > 118* 110*  --  123*  < > = values in this interval not displayed. Cardiac Enzymes:  Recent Labs  02/19/15 2320 08/18/15 1630 09/09/15 1546  TROPONINI <0.03 <0.03 <0.03   BNP: Invalid input(s): POCBNP CBG:  Recent Labs  09/10/15 2148 09/11/15 0806 09/11/15 1114  GLUCAP 193* 121* 149*    Radiological Exams: Dg Shoulder Right  09/01/2015  CLINICAL DATA:  Right shoulder pain post fall at grocery store this morning EXAM: RIGHT SHOULDER - 2+ VIEW COMPARISON:  None. FINDINGS: Four views of the right shoulder submitted. Degenerative changes are noted acromioclavicular joint. Dystrophic calcifications are noted just above the acromioclavicular joint. There is probable subtle nondisplaced fracture of the right greater humeral tuberosity. IMPRESSION: Probable subtle nondisplaced fracture of greater humeral tuberosity. Degenerative changes AC joint. Electronically Signed   By: Lahoma Crocker M.D.   On: 09/01/2015 13:37   Ct Chest W Contrast  09/01/2015  CLINICAL DATA:  [place] today in [employer].  Tripped over a cart. EXAM: CT CHEST, ABDOMEN, AND PELVIS WITH CONTRAST TECHNIQUE: Multidetector CT imaging of the chest, abdomen and pelvis was performed following the standard protocol during bolus administration of intravenous contrast. CONTRAST:  181m ISOVUE-300 IOPAMIDOL (ISOVUE-300) INJECTION 61% COMPARISON:  07/14/2015 FINDINGS: CT CHEST FINDINGS Mediastinum/Lymph Nodes: No breast masses or chest wall hematoma. No supraclavicular or axillary adenopathy. The thyroid gland appears normal. The heart is normal in size. No pericardial effusion. No mediastinal or hilar mass or adenopathy or hematoma. The aorta is normal in caliber. No dissection. Scattered atherosclerotic calcifications. Coronary artery calcifications are no new line the  esophagus is grossly normal. Lungs/Pleura: Exam is somewhat limited by breathing motion artifact. There are areas of subpleural atelectasis and areas of mild peribronchial thickening. A few patchy E 8 feet sub solid nodular densities are noted. The largest measures 12 mm in the right upper lobe on image number 25. No pulmonary contusion, pneumothorax or pleural effusion. Musculoskeletal: Cervical spine fusion hardware noted. Pole a spinal cord stimulator noted in the thoracic spine. In the sternum is intact. The thoracic vertebral bodies are normally aligned. No acute bony findings. No definite rib fractures. CT ABDOMEN PELVIS FINDINGS Hepatobiliary: Stable cirrhotic changes involving the liver. No focal hepatic lesions or acute hepatic injury. No biliary dilatation. The gallbladder is surgically absent. No common bile duct dilatation. Pancreas: No mass, inflammation or acute injury. Spleen: Stable mild splenomegaly.  No acute injury. Adrenals/Urinary Tract: The adrenal glands and kidneys are unremarkable and stable. No acute injury. Stomach/Bowel: The stomach, duodenum, small bowel and colon are grossly normal without oral contrast. No inflammatory changes, mass lesions or  obstructive findings. No acute injury. No free fluid or free air. Vascular/Lymphatic: Advanced atherosclerotic calcifications involving the aorta and branch vessel ostia. No aneurysm or dissection. The major venous structures are patent. No mesenteric or retroperitoneal mass, adenopathy or hematoma. Reproductive: The uterus and ovaries are surgically absent. Other: No pelvic mass or adenopathy. No free pelvic fluid collections. The bladder is normal. No inguinal mass or adenopathy. No abdominal wall hernia or subcutaneous lesions. Surgical changes from prior spinal surgery. Musculoskeletal: No acute bony findings. Stable surgical changes involving the lumbar spine. No acute fracture. The bony pelvis is intact. The pubic symphysis and SI joints  appear normal. Both hips are normally located. No hip fracture. IMPRESSION: 1. No acute injury involving the chest, abdomen or pelvis. 2. Mild emphysematous changes in the lungs and patchy airspace nodules. This could reflect inflammatory change but recommend followup noncontrast chest CT in 3 months. 3. Stable cirrhotic changes involving the liver with portal venous hypertension and portal venous collaterals. Stable splenomegaly. 4. No acute bony findings. Electronically Signed   By: Marijo Sanes M.D.   On: 09/01/2015 14:36   Ct Cervical Spine Wo Contrast  09/01/2015  CLINICAL DATA:  Recent fall with neck pain, initial encounter EXAM: CT CERVICAL SPINE WITHOUT CONTRAST TECHNIQUE: Multidetector CT imaging of the cervical spine was performed without intravenous contrast. Multiplanar CT image reconstructions were also generated. COMPARISON:  03/21/2015 FINDINGS: Seven cervical segments are well visualized. Changes of prior fusion from C5-C7 are seen. Facet hypertrophic changes are noted at all levels with varying degrees of neural foraminal narrowing. No acute fracture or acute facet abnormality is noted. The surrounding soft tissues are within normal limits. IMPRESSION: Multilevel degenerative changes and postoperative changes. No acute bony abnormality is noted. Electronically Signed   By: [name] Catalina M.D.   On: 09/01/2015 13:14   Ct Abdomen Pelvis W Contrast  09/01/2015  CLINICAL DATA:  [place] today in [employer].  Tripped over a cart. EXAM: CT CHEST, ABDOMEN, AND PELVIS WITH CONTRAST TECHNIQUE: Multidetector CT imaging of the chest, abdomen and pelvis was performed following the standard protocol during bolus administration of intravenous contrast. CONTRAST:  1102m ISOVUE-300 IOPAMIDOL (ISOVUE-300) INJECTION 61% COMPARISON:  07/14/2015 FINDINGS: CT CHEST FINDINGS Mediastinum/Lymph Nodes: No breast masses or chest wall hematoma. No supraclavicular or axillary adenopathy. The thyroid gland appears normal.  The heart is normal in size. No pericardial effusion. No mediastinal or hilar mass or adenopathy or hematoma. The aorta is normal in caliber. No dissection. Scattered atherosclerotic calcifications. Coronary artery calcifications are no new line the esophagus is grossly normal. Lungs/Pleura: Exam is somewhat limited by breathing motion artifact. There are areas of subpleural atelectasis and areas of mild peribronchial thickening. A few patchy E 8 feet sub solid nodular densities are noted. The largest measures 12 mm in the right upper lobe on image number 25. No pulmonary contusion, pneumothorax or pleural effusion. Musculoskeletal: Cervical spine fusion hardware noted. Pole a spinal cord stimulator noted in the thoracic spine. In the sternum is intact. The thoracic vertebral bodies are normally aligned. No acute bony findings. No definite rib fractures. CT ABDOMEN PELVIS FINDINGS Hepatobiliary: Stable cirrhotic changes involving the liver. No focal hepatic lesions or acute hepatic injury. No biliary dilatation. The gallbladder is surgically absent. No common bile duct dilatation. Pancreas: No mass, inflammation or acute injury. Spleen: Stable mild splenomegaly.  No acute injury. Adrenals/Urinary Tract: The adrenal glands and kidneys are unremarkable and stable. No acute injury. Stomach/Bowel: The stomach, duodenum, small bowel  and colon are grossly normal without oral contrast. No inflammatory changes, mass lesions or obstructive findings. No acute injury. No free fluid or free air. Vascular/Lymphatic: Advanced atherosclerotic calcifications involving the aorta and branch vessel ostia. No aneurysm or dissection. The major venous structures are patent. No mesenteric or retroperitoneal mass, adenopathy or hematoma. Reproductive: The uterus and ovaries are surgically absent. Other: No pelvic mass or adenopathy. No free pelvic fluid collections. The bladder is normal. No inguinal mass or adenopathy. No abdominal wall  hernia or subcutaneous lesions. Surgical changes from prior spinal surgery. Musculoskeletal: No acute bony findings. Stable surgical changes involving the lumbar spine. No acute fracture. The bony pelvis is intact. The pubic symphysis and SI joints appear normal. Both hips are normally located. No hip fracture. IMPRESSION: 1. No acute injury involving the chest, abdomen or pelvis. 2. Mild emphysematous changes in the lungs and patchy airspace nodules. This could reflect inflammatory change but recommend followup noncontrast chest CT in 3 months. 3. Stable cirrhotic changes involving the liver with portal venous hypertension and portal venous collaterals. Stable splenomegaly. 4. No acute bony findings. Electronically Signed   By: Marijo Sanes M.D.   On: 09/01/2015 14:36   Dg Chest Portable 1 View  09/09/2015  CLINICAL DATA:  65 year old female with chest pain and lethargy. Initial encounter. EXAM: PORTABLE CHEST 1 VIEW COMPARISON:  CT chest abdomen and pelvis 09/01/2015 and earlier. FINDINGS: Portable AP upright view at 1531 hours. Lordotic view. Chronic lower thoracic spinal stimulator and lower cervical ACDF hardware. Stable cardiac size and mediastinal contours. No pneumothorax, pleural effusion or consolidation identified portably. Increased pulmonary vascular congestion, no overt edema suspected at this time. Visualized tracheal air column is within normal limits. IMPRESSION: Increased pulmonary vascularity without overt edema. Electronically Signed   By: Genevie Ann M.D.   On: 09/09/2015 15:50   Dg Humerus Right  09/01/2015  CLINICAL DATA:  Pain following fall earlier today EXAM: RIGHT HUMERUS - 2+ VIEW COMPARISON:  None. FINDINGS: Frontal and lateral views were obtained. There is a fracture of the proximal right humeral metaphysis with subtle impaction at the fracture site. No other fracture. No dislocation. The joint spaces appear unremarkable. Note that there is calcification in the superior  acromioclavicular joint on the right. IMPRESSION: Impacted fracture proximal humeral metaphysis with alignment near anatomic. No other fracture. No dislocation. Calcification is noted in the superior aspect of the right acromioclavicular joint. Electronically Signed   By: Lowella Grip III M.D.   On: 09/01/2015 13:36    Assessment/Plan  Generalized weakness Will have her work with physical therapy and occupational therapy team to help with gait training and muscle strengthening exercises.fall precautions. Skin care. Encourage to be out of bed.   Right humerus fracture S/p sling to RUE. Get orthopedic follow up. Will have patient work with PT/OT as tolerated to regain strength and restore function.  Fall precautions are in place. Change her norco to 5-325 mg 1-2 tab q4h prn pain and monitor  DM With recent hypoglycemia monitor cbg closely. Currently off oral hypoglycemics. Continue SSI and monitor cbg. To consider a single hypoglyemic agent prior to discharge based on cbg reading. Check a1c Lab Results  Component Value Date   HGBA1C 8.8* 07/16/2015   Thrombocytopenia No bleed reported, monitor platelet count  anemia Monitor cbc  Bipolar disorder Stable, continue abilify, remeron, lamictal and prn klonopin  Constipation Continue colace 100 mg bid  gerd Stable, continue prilosec  Fibromyalgia Continue lyrica  RLS Continue requip  HLD Continue statin  Insomnia Continue Ambien    Goals of care: short term rehabilitation   Labs/tests ordered: cbc, cmp, a1c 09/15/15  Family/ staff Communication: reviewed care plan with patient and nursing supervisor    Blanchie Serve, MD Internal Medicine [place], [place] [address] Cell Phone (Monday-Friday 8 am - 5 pm): [phone] On Call: [phone] and follow prompts after 5 pm and on weekends Office Phone: [phone] Office Fax: [phone]

## 2015-09-15 LAB — HEMOGLOBIN A1C: Hemoglobin A1C: 7

## 2015-09-15 LAB — HEPATIC FUNCTION PANEL
ALK PHOS: 204 U/L — AB (ref 25–125)
ALT: 17 U/L (ref 7–35)
AST: 35 U/L (ref 13–35)
Bilirubin, Total: 0.6 mg/dL

## 2015-09-15 LAB — CBC AND DIFFERENTIAL
HCT: 30 % — AB (ref 36–46)
HEMOGLOBIN: 8.9 g/dL — AB (ref 12.0–16.0)
Platelets: 136 10*3/uL — AB (ref 150–399)
WBC: 4.6 10*3/mL

## 2015-09-15 LAB — BASIC METABOLIC PANEL
BUN: 19 mg/dL (ref 4–21)
Creatinine: 0.7 mg/dL (ref 0.5–1.1)
Glucose: 130 mg/dL
POTASSIUM: 4.1 mmol/L (ref 3.4–5.3)
SODIUM: 142 mmol/L (ref 137–147)

## 2015-09-16 ENCOUNTER — Ambulatory Visit: Payer: Medicare Other | Admitting: Family Medicine

## 2015-09-16 DIAGNOSIS — Z0289 Encounter for other administrative examinations: Secondary | ICD-10-CM

## 2015-09-17 ENCOUNTER — Telehealth: Payer: Self-pay | Admitting: Family Medicine

## 2015-09-17 NOTE — Telephone Encounter (Signed)
Patient did not come for their scheduled appointment yesterday forhospital follow up discharged 4/7 [facility] .  Please let me know if the patient needs to be contacted immediately for follow up or if no follow up is necessary.

## 2015-09-21 ENCOUNTER — Non-Acute Institutional Stay (SKILLED_NURSING_FACILITY): Payer: Medicare Other | Admitting: Family

## 2015-09-21 ENCOUNTER — Encounter: Payer: Self-pay | Admitting: Family

## 2015-09-21 DIAGNOSIS — F419 Anxiety disorder, unspecified: Secondary | ICD-10-CM | POA: Diagnosis not present

## 2015-09-21 DIAGNOSIS — F319 Bipolar disorder, unspecified: Secondary | ICD-10-CM

## 2015-09-21 DIAGNOSIS — F3130 Bipolar disorder, current episode depressed, mild or moderate severity, unspecified: Secondary | ICD-10-CM

## 2015-09-21 NOTE — Progress Notes (Signed)
Location:  [place] Room Number: 570 Place of Service:  SNF [phone]) Provider:  Marlowe Sax, FNP-C Blanchie Serve, MD   [name], MD  Patient Care Team: Lucille Passy, MD as PCP - General Chesley Mires, MD (Pulmonary Disease) Gatha Mayer, MD as Consulting Physician (Gastroenterology) Wellington Hampshire, MD as Consulting Physician (Cardiology) Inda Castle, MD as Consulting Physician (Gastroenterology) Melrose Nakayama, MD as Consulting Physician (Cardiothoracic Surgery)  Extended Emergency Contact Information Primary Emergency Contact: Esque,Tammie Address: [place], [place] [address] Johnnette Litter of [place] Phone: [phone] Mobile Phone: [phone] Relation: Other Secondary Emergency Contact: Patton,Edna Address: [address]          [place], [place] [address] [place] Phone: [phone] Relation: Friend  Code Status:  Full Code  Goals of care: Advanced Directive information Advanced Directives 09/09/2015  Does patient have an advance directive? No  Type of Advance Directive -  Does patient want to make changes to advanced directive? -  Copy of advanced directive(s) in chart? -  Would patient like information on creating an advanced directive? Yes - Environmental education officer Complaint  Patient presents with  . Acute Visit    Acute Concerns    HPI:  Pt is a 65 y.o. female seen today at [facility] for an acute visit for evaluation of increased crying. She has a medical history of HTN, Bipolar, anxiety, RLS, GERD among others. She is seen in her room today per patient's daughter request. Patient's daughter reported that patient has had increased crying episodes and laughing at times. Provider spoke with patient's daughter Lynelle Smoke at Telephone # [phone] who states patient was taking Abilify 30 mg Tablet which was decreased prior to admission to Rehab.  Patient expresses feelings of depression and has been crying but does not know why she is crying.     Past Medical History  Diagnosis Date  . Adenomatous colon polyp 08/27/2014    Polyps x 3  . Insomnia   . RLS (restless legs syndrome)   . Colon polyp   . Incontinence   . H/O: rheumatic fever   . Hyperlipidemia   . Unspecified disorders of nervous system   . Bipolar affective disorder ([place])     h/o  . H/O: CVA (cardiovascular accident)     TIA  . Fatty liver 04/09/08    found in abd CT  . Shortness of breath   . Obstructive sleep apnea     not using CPAP, last sleep study 2.5 years ago, per pt doctor is aware  . Blood transfusion 2000  . GERD (gastroesophageal reflux disease)   . Arthritis   . 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 Asthma   . Anxiety   . Depression   . Stroke [facility])     TIA- 2002  . Ulcer   . Diabetes mellitus without complication ([place])   . Anemia   . Blood transfusion without reported diagnosis   . Sleep apnea   . Platelets decreased ([place])   . 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 Chronic abdominal pain     abdominal wall pain   Past Surgical History  Procedure Laterality Date  . Back surgery      x 45  . Cholecystectomy    .  Rotator cuff repair      left  . Abdominal hysterectomy      total  . Lumbar fusion  10/09  . Hemorroidectomy      and colon polyp removed  . Abdominal exploration surgery    . Posterior cervical fusion/foraminotomy    . Eye surgery      bilat  . Lumbar wound debridement  05/09/2011    Procedure: LUMBAR WOUND DEBRIDEMENT;  Surgeon: Dahlia Bailiff;  Location: [place];  Service: Orthopedics;  Laterality: N/A;  IRRIGATION AND DEBRIDEMENT SPINAL WOUND  . Axillary artery cannulation via 8-mm hemashield graft, median sternotomy, extracorporeal circulation with deep hypothermic circulatory arrest, repair of  aortic dessection  01/23/2009    Dr Arlyce Dice  . Cataract      both eyes  . Colonoscopy    . Liver biopsy  04/10/2012      Procedure: LIVER BIOPSY;  Surgeon: Inda Castle, MD;  Location: [facility];  Service: Endoscopy;  Laterality: N/A;  ultrasound to mark order for abd limited/liver to be marked to be sent by [email or web address]     Allergies  Allergen Reactions  . Morphine And Related Other (See Comments)    Reaction:  Hypotension  . Doxycycline Hives, Swelling and Other (See Comments)    Reaction:  Facial swelling       Medication List       This list is accurate as of: 09/21/15  4:06 PM.  Always use your most recent med list.               ARIPiprazole 20 MG tablet  Commonly known as:  ABILIFY  Take 20 mg by mouth daily.     budesonide-formoterol 160-4.5 MCG/ACT inhaler  Commonly known as:  SYMBICORT  Inhale 2 puffs into the lungs 2 (two) times daily.     clonazePAM 0.5 MG tablet  Commonly known as:  KLONOPIN  Take 0.5 mg by mouth 2 (two) times daily as needed for anxiety.     dicyclomine 10 MG capsule  Commonly known as:  BENTYL  Take 1 capsule (10 mg total) by mouth 3 (three) times daily before meals.     docusate sodium 100 MG capsule  Commonly known as:  COLACE  Take 100 mg by mouth 2 (two) times daily.     HYDROcodone-acetaminophen 5-325 MG tablet  Commonly known as:  NORCO/VICODIN  Take 1-2 tablets by mouth every 4 (four) hours as needed for moderate pain.     insulin lispro 100 UNIT/ML injection  Commonly known as:  HUMALOG  Inject into the skin 3 (three) times daily before meals. 150-200= 0 units, 201-250= 5 units, 251-300= 8 units, 301-350= 9 units, >350 CALL MD     lamoTRIgine 200 MG tablet  Commonly known as:  LAMICTAL  Take 1 tablet (200 mg total) by mouth at bedtime.     lamoTRIgine 100 MG tablet  Commonly known as:  LAMICTAL  Take 1 tablet (100 mg total) by mouth every morning.     metoCLOPramide 10 MG tablet  Commonly known as:  REGLAN  Take 10 mg by mouth 4 (four) times daily as needed (chronic epigastric pain).     mirtazapine 15 MG tablet  Commonly  known as:  REMERON  Take 15 mg by mouth at bedtime.     nystatin 100000 UNIT/GM Powd  Apply 1 g topically 2 (two) times daily as needed (for rash).     omeprazole 20 MG capsule  Commonly known as:  PRILOSEC  Take 40 mg by mouth daily.     pregabalin 50 MG capsule  Commonly known as:  LYRICA  Take 1 capsule (50 mg total) by mouth 3 (three) times daily.     promethazine 25 MG tablet  Commonly known as:  PHENERGAN  Take 25 mg by mouth every 8 (eight) hours as needed for nausea or vomiting.     rOPINIRole 3 MG tablet  Commonly known as:  REQUIP  Take 3 mg by mouth at bedtime.     simvastatin 10 MG tablet  Commonly known as:  ZOCOR  Take 10 mg by mouth at bedtime.     tolterodine 2 MG tablet  Commonly known as:  DETROL  Take 2 mg by mouth daily.     zolpidem 5 MG tablet  Commonly known as:  AMBIEN  Take 5 mg by mouth at bedtime.        Review of Systems  Constitutional: Negative for fever, chills, activity change, appetite change and fatigue.  HENT: Negative for congestion.   Eyes: Negative.   Respiratory: Negative for cough, chest tightness, shortness of breath and wheezing.   Cardiovascular: Negative for chest pain, palpitations and leg swelling.  Gastrointestinal: Negative.   Genitourinary: Negative for dysuria, urgency and flank pain.  Musculoskeletal:       Right shoulder pain   Skin: Negative.   Psychiatric/Behavioral: Negative for suicidal ideas, confusion, sleep disturbance, self-injury and agitation. The patient is nervous/anxious.        Increased episodes of crying and laughing at times.     Immunization History  Administered Date(s) Administered  . Influenza Split 03/05/2013  . Influenza Whole 03/07/2007, 02/26/2008, 02/10/2010  . Influenza,inj,Quad PF,36+ Mos 02/07/2014, 02/11/2015  . PPD Test 09/11/2015  . Pneumococcal Conjugate-13 09/10/2015  . Pneumococcal Polysaccharide-23 04/01/2009  . Td 12/16/2008  . Zoster 12/13/2010   Pertinent  Health  Maintenance Due  Topic Date Due  . URINE MICROALBUMIN  10/15/2014  . MAMMOGRAM  01/14/2015  . FOOT EXAM  08/19/2015  . OPHTHALMOLOGY EXAM  10/02/2015  . INFLUENZA VACCINE  01/05/2016  . HEMOGLOBIN A1C  01/13/2016  . PNA vac Low Risk Adult (2 of 2 - PPSV23) 09/09/2016  . COLONOSCOPY  08/26/2017  . DEXA SCAN  Completed   Fall Risk  06/18/2015 06/04/2015 04/28/2015 08/19/2014 07/29/2013  Falls in the past year? Yes (No Data) Yes Yes No  Number falls in past yr: 2 or more - 2 or more 2 or more -  Injury with Fall? Yes - No No -  Risk Factor Category  High Fall Risk - High Fall Risk - -  Risk for fall due to : - - History of fall(s) - -  Follow up (No Data) - Education provided;Falls prevention discussed - -   Functional Status Survey:    Filed Vitals:   09/21/15 1537  BP: 114/58  Pulse: 79  Temp: 98.1 F (36.7 C)  Resp: 19  Height: 5' 3"  (1.6 m)  Weight: 225 lb (102.059 kg)  SpO2: 97%   Body mass index is 39.87 kg/(m^2). Physical Exam  Constitutional: She is oriented to person, place, and time. She appears well-developed and well-nourished. No distress.  HENT:  Head: Normocephalic.  Mouth/Throat: Oropharynx is clear and moist.  Eyes: Conjunctivae and EOM are normal. Pupils are equal, round, and reactive to light.  Neck: Normal range of motion. No JVD present. No tracheal deviation present.  Cardiovascular: Normal rate, regular rhythm and intact distal pulses.  Exam reveals no gallop and no friction rub.   No murmur heard. Pulmonary/Chest: Effort normal and breath sounds normal. No respiratory distress. She has no wheezes. She has no rales.  Abdominal: Soft. Bowel sounds are normal. She exhibits no distension and no mass. There is no tenderness. There is no rebound and no guarding.  Musculoskeletal: She exhibits no edema or tenderness.  Normal ROM except Limited ROM to right shoulder. Right hand sling in place.   Lymphadenopathy:    She has no cervical adenopathy.    Neurological: She is oriented to person, place, and time.  Skin: Skin is warm and dry. No rash noted. No erythema. No pallor.  Psychiatric:  Emotional during the visit.     Labs reviewed:  Recent Labs  08/18/15 1630 09/01/15 1148 09/09/15 09/09/15 1546 09/15/15  NA 139 138 136* 136 142  K 3.7 3.8  --  3.6 4.1  CL 107 112*  --  106  --   CO2 25 18*  --  25  --   GLUCOSE 77 136*  --  119*  --   BUN 12 10 14 14 19   CREATININE 0.64 0.70 0.6 0.59 0.7  CALCIUM 9.0 8.5*  --  8.6*  --     Recent Labs  07/14/15 1217 08/18/15 1630 09/09/15 1546 09/15/15  AST 43* 54* 37 35  ALT 26 25 17 17   ALKPHOS 198* 174* 122 204*  BILITOT 0.7 0.6 0.6  --   PROT 7.7 7.5 6.7  --   ALBUMIN 3.5 3.6 3.2*  --     Recent Labs  05/11/15 0923  08/18/15 1630 09/01/15 1148 09/09/15 09/09/15 1546 09/15/15  WBC 4.9  < > 5.5 5.3 3.6 3.6 4.6  NEUTROABS 3.3  --  4.3 3.9  --   --   --   HGB 12.1  < > 11.3* 10.8*  --  9.6* 8.9*  HCT 37.0  < > 35.5 33.9*  --  30.0* 30*  MCV 88.5  < > 83.8 84.2  --  85.4  --   PLT 146*  < > 118* 110*  --  123* 136*  < > = values in this interval not displayed. Lab Results  Component Value Date   TSH 4.24 12/15/2014   Lab Results  Component Value Date   HGBA1C 7.0 09/15/2015   Lab Results  Component Value Date   CHOL 153 12/15/2014   HDL 73.50 12/15/2014   LDLCALC 59 12/15/2014   LDLDIRECT 143.4 07/22/2013   TRIG 101.0 12/15/2014   CHOLHDL 2 12/15/2014    Significant Diagnostic Results in last 30 days:  Dg Shoulder Right  09/01/2015  CLINICAL DATA:  Right shoulder pain post fall at grocery store this morning EXAM: RIGHT SHOULDER - 2+ VIEW COMPARISON:  None. FINDINGS: Four views of the right shoulder submitted. Degenerative changes are noted acromioclavicular joint. Dystrophic calcifications are noted just above the acromioclavicular joint. There is probable subtle nondisplaced fracture of the right greater humeral tuberosity. IMPRESSION: Probable subtle  nondisplaced fracture of greater humeral tuberosity. Degenerative changes AC joint. Electronically Signed   By: Lahoma Crocker M.D.   On: 09/01/2015 13:37   Ct Chest W Contrast  09/01/2015  CLINICAL DATA:  [place] today in [employer].  Tripped over a cart. EXAM: CT CHEST, ABDOMEN, AND PELVIS WITH CONTRAST TECHNIQUE: Multidetector CT imaging of the chest, abdomen and pelvis was performed following the standard protocol during bolus administration of intravenous contrast. CONTRAST:  119m ISOVUE-300 IOPAMIDOL (  ISOVUE-300) INJECTION 61% COMPARISON:  07/14/2015 FINDINGS: CT CHEST FINDINGS Mediastinum/Lymph Nodes: No breast masses or chest wall hematoma. No supraclavicular or axillary adenopathy. The thyroid gland appears normal. The heart is normal in size. No pericardial effusion. No mediastinal or hilar mass or adenopathy or hematoma. The aorta is normal in caliber. No dissection. Scattered atherosclerotic calcifications. Coronary artery calcifications are no new line the esophagus is grossly normal. Lungs/Pleura: Exam is somewhat limited by breathing motion artifact. There are areas of subpleural atelectasis and areas of mild peribronchial thickening. A few patchy E 8 feet sub solid nodular densities are noted. The largest measures 12 mm in the right upper lobe on image number 25. No pulmonary contusion, pneumothorax or pleural effusion. Musculoskeletal: Cervical spine fusion hardware noted. Pole a spinal cord stimulator noted in the thoracic spine. In the sternum is intact. The thoracic vertebral bodies are normally aligned. No acute bony findings. No definite rib fractures. CT ABDOMEN PELVIS FINDINGS Hepatobiliary: Stable cirrhotic changes involving the liver. No focal hepatic lesions or acute hepatic injury. No biliary dilatation. The gallbladder is surgically absent. No common bile duct dilatation. Pancreas: No mass, inflammation or acute injury. Spleen: Stable mild splenomegaly.  No acute injury.  Adrenals/Urinary Tract: The adrenal glands and kidneys are unremarkable and stable. No acute injury. Stomach/Bowel: The stomach, duodenum, small bowel and colon are grossly normal without oral contrast. No inflammatory changes, mass lesions or obstructive findings. No acute injury. No free fluid or free air. Vascular/Lymphatic: Advanced atherosclerotic calcifications involving the aorta and branch vessel ostia. No aneurysm or dissection. The major venous structures are patent. No mesenteric or retroperitoneal mass, adenopathy or hematoma. Reproductive: The uterus and ovaries are surgically absent. Other: No pelvic mass or adenopathy. No free pelvic fluid collections. The bladder is normal. No inguinal mass or adenopathy. No abdominal wall hernia or subcutaneous lesions. Surgical changes from prior spinal surgery. Musculoskeletal: No acute bony findings. Stable surgical changes involving the lumbar spine. No acute fracture. The bony pelvis is intact. The pubic symphysis and SI joints appear normal. Both hips are normally located. No hip fracture. IMPRESSION: 1. No acute injury involving the chest, abdomen or pelvis. 2. Mild emphysematous changes in the lungs and patchy airspace nodules. This could reflect inflammatory change but recommend followup noncontrast chest CT in 3 months. 3. Stable cirrhotic changes involving the liver with portal venous hypertension and portal venous collaterals. Stable splenomegaly. 4. No acute bony findings. Electronically Signed   By: Marijo Sanes M.D.   On: 09/01/2015 14:36   Ct Cervical Spine Wo Contrast  09/01/2015  CLINICAL DATA:  Recent fall with neck pain, initial encounter EXAM: CT CERVICAL SPINE WITHOUT CONTRAST TECHNIQUE: Multidetector CT imaging of the cervical spine was performed without intravenous contrast. Multiplanar CT image reconstructions were also generated. COMPARISON:  03/21/2015 FINDINGS: Seven cervical segments are well visualized. Changes of prior fusion from  C5-C7 are seen. Facet hypertrophic changes are noted at all levels with varying degrees of neural foraminal narrowing. No acute fracture or acute facet abnormality is noted. The surrounding soft tissues are within normal limits. IMPRESSION: Multilevel degenerative changes and postoperative changes. No acute bony abnormality is noted. Electronically Signed   By: [name] Catalina M.D.   On: 09/01/2015 13:14   Ct Abdomen Pelvis W Contrast  09/01/2015  CLINICAL DATA:  [place] today in [employer].  Tripped over a cart. EXAM: CT CHEST, ABDOMEN, AND PELVIS WITH CONTRAST TECHNIQUE: Multidetector CT imaging of the chest, abdomen and pelvis was performed following  the standard protocol during bolus administration of intravenous contrast. CONTRAST:  185m ISOVUE-300 IOPAMIDOL (ISOVUE-300) INJECTION 61% COMPARISON:  07/14/2015 FINDINGS: CT CHEST FINDINGS Mediastinum/Lymph Nodes: No breast masses or chest wall hematoma. No supraclavicular or axillary adenopathy. The thyroid gland appears normal. The heart is normal in size. No pericardial effusion. No mediastinal or hilar mass or adenopathy or hematoma. The aorta is normal in caliber. No dissection. Scattered atherosclerotic calcifications. Coronary artery calcifications are no new line the esophagus is grossly normal. Lungs/Pleura: Exam is somewhat limited by breathing motion artifact. There are areas of subpleural atelectasis and areas of mild peribronchial thickening. A few patchy E 8 feet sub solid nodular densities are noted. The largest measures 12 mm in the right upper lobe on image number 25. No pulmonary contusion, pneumothorax or pleural effusion. Musculoskeletal: Cervical spine fusion hardware noted. Pole a spinal cord stimulator noted in the thoracic spine. In the sternum is intact. The thoracic vertebral bodies are normally aligned. No acute bony findings. No definite rib fractures. CT ABDOMEN PELVIS FINDINGS Hepatobiliary: Stable cirrhotic changes involving the  liver. No focal hepatic lesions or acute hepatic injury. No biliary dilatation. The gallbladder is surgically absent. No common bile duct dilatation. Pancreas: No mass, inflammation or acute injury. Spleen: Stable mild splenomegaly.  No acute injury. Adrenals/Urinary Tract: The adrenal glands and kidneys are unremarkable and stable. No acute injury. Stomach/Bowel: The stomach, duodenum, small bowel and colon are grossly normal without oral contrast. No inflammatory changes, mass lesions or obstructive findings. No acute injury. No free fluid or free air. Vascular/Lymphatic: Advanced atherosclerotic calcifications involving the aorta and branch vessel ostia. No aneurysm or dissection. The major venous structures are patent. No mesenteric or retroperitoneal mass, adenopathy or hematoma. Reproductive: The uterus and ovaries are surgically absent. Other: No pelvic mass or adenopathy. No free pelvic fluid collections. The bladder is normal. No inguinal mass or adenopathy. No abdominal wall hernia or subcutaneous lesions. Surgical changes from prior spinal surgery. Musculoskeletal: No acute bony findings. Stable surgical changes involving the lumbar spine. No acute fracture. The bony pelvis is intact. The pubic symphysis and SI joints appear normal. Both hips are normally located. No hip fracture. IMPRESSION: 1. No acute injury involving the chest, abdomen or pelvis. 2. Mild emphysematous changes in the lungs and patchy airspace nodules. This could reflect inflammatory change but recommend followup noncontrast chest CT in 3 months. 3. Stable cirrhotic changes involving the liver with portal venous hypertension and portal venous collaterals. Stable splenomegaly. 4. No acute bony findings. Electronically Signed   By: PMarijo SanesM.D.   On: 09/01/2015 14:36   Dg Chest Portable 1 View  09/09/2015  CLINICAL DATA:  65year old female with chest pain and lethargy. Initial encounter. EXAM: PORTABLE CHEST 1 VIEW COMPARISON:   CT chest abdomen and pelvis 09/01/2015 and earlier. FINDINGS: Portable AP upright view at 1531 hours. Lordotic view. Chronic lower thoracic spinal stimulator and lower cervical ACDF hardware. Stable cardiac size and mediastinal contours. No pneumothorax, pleural effusion or consolidation identified portably. Increased pulmonary vascular congestion, no overt edema suspected at this time. Visualized tracheal air column is within normal limits. IMPRESSION: Increased pulmonary vascularity without overt edema. Electronically Signed   By: HGenevie AnnM.D.   On: 09/09/2015 15:50   Dg Humerus Right  09/01/2015  CLINICAL DATA:  Pain following fall earlier today EXAM: RIGHT HUMERUS - 2+ VIEW COMPARISON:  None. FINDINGS: Frontal and lateral views were obtained. There is a fracture of the proximal right humeral metaphysis with  subtle impaction at the fracture site. No other fracture. No dislocation. The joint spaces appear unremarkable. Note that there is calcification in the superior acromioclavicular joint on the right. IMPRESSION: Impacted fracture proximal humeral metaphysis with alignment near anatomic. No other fracture. No dislocation. Calcification is noted in the superior aspect of the right acromioclavicular joint. Electronically Signed   By: Lowella Grip III M.D.   On: 09/01/2015 13:36    Assessment/Plan Bipolar depression  Patient's daughter request Abilify increased back to 30 mg Tablet. Increase Abilify to 30 mg Tablet daily. Consult Psychiatry service.  Anxiety  Tearful during visit.Has Klonopin PRN but didn't know she had to request for medication. Encouraged to request klonopin as needed. Continue with Klonopin 0.5 mg Tablet Bid PRN     Family/ staff Communication: Reviewed plan of care with patient's Daughter Tammy Tel #  [phone] Labs/tests ordered:  None

## 2015-09-22 ENCOUNTER — Other Ambulatory Visit: Payer: Self-pay

## 2015-09-22 MED ORDER — HYDROCODONE-ACETAMINOPHEN 5-325 MG PO TABS: 1.0000 | ORAL_TABLET | ORAL | Status: DC | PRN

## 2015-09-22 NOTE — Telephone Encounter (Signed)
[facility]  [address] [place] [address]  Phone: [phone]  Fax: [phone]

## 2015-09-24 ENCOUNTER — Ambulatory Visit (INDEPENDENT_AMBULATORY_CARE_PROVIDER_SITE_OTHER): Payer: Medicare Other

## 2015-09-24 ENCOUNTER — Encounter: Payer: Self-pay | Admitting: Podiatry

## 2015-09-24 ENCOUNTER — Ambulatory Visit (INDEPENDENT_AMBULATORY_CARE_PROVIDER_SITE_OTHER): Payer: Medicare Other | Admitting: Podiatry

## 2015-09-24 DIAGNOSIS — B351 Tinea unguium: Secondary | ICD-10-CM

## 2015-09-24 DIAGNOSIS — S93402S Sprain of unspecified ligament of left ankle, sequela: Secondary | ICD-10-CM

## 2015-09-24 DIAGNOSIS — M79676 Pain in unspecified toe(s): Secondary | ICD-10-CM | POA: Diagnosis not present

## 2015-09-24 DIAGNOSIS — G629 Polyneuropathy, unspecified: Secondary | ICD-10-CM

## 2015-09-24 DIAGNOSIS — M79673 Pain in unspecified foot: Secondary | ICD-10-CM

## 2015-09-24 DIAGNOSIS — R52 Pain, unspecified: Secondary | ICD-10-CM

## 2015-09-24 DIAGNOSIS — E1149 Type 2 diabetes mellitus with other diabetic neurological complication: Secondary | ICD-10-CM

## 2015-09-24 DIAGNOSIS — M19079 Primary osteoarthritis, unspecified ankle and foot: Secondary | ICD-10-CM

## 2015-09-24 NOTE — Progress Notes (Signed)
Patient ID: Stephanie [name], female   DOB: July 06, 1950, 65 y.o.   MRN: [id number]  Subjective: 65 y.o. returns the office today for painful, elongated, thickened toenails which she cannot trim herself. Denies any redness or drainage around the nails. She's been taking the Lyrica which seems to help quite a bit. 3 weeks that she did fall injuring her right arm. She has a sister left ankle his been somewhat swollen however she has not noticed much discomfort. She is unsure palms is been going on for period she did not with his habit of the time of the fall. Denies any acute changes since last appointment and no new complaints today. Denies any systemic complaints such as fevers, chills, nausea, vomiting.   Objective: AAO 3, NAD DP/PT pulses palpable, CRT less than 3 seconds Protective sensation decreased with Simms Weinstein monofilament Nails hypertrophic, dystrophic, elongated, brittle, discolored 10. There is tenderness overlying the nails 1-5 bilaterally. There is no surrounding erythema or drainage along the nail sites. There is mild Medial Lateral Malleolus of the Left Ankle There Is Mild Edema or Any Erythema or Increase in Warmth Left Ankle. Ankle, Subtalar Joint Range Of Motion Is Intact. There Is No Tenderness to the Foot There Is No Swelling to the Foot. No open lesions or pre-ulcerative lesions are identified. No other areas of tenderness bilateral lower extremities. No overlying edema, erythema, increased warmth. No pain with calf compression, swelling, warmth, erythema.  Assessment: Patient presents with symptomatic onychomycosis  Plan: -Treatment options including alternatives, risks, complications were discussed -Nails sharply debrided 10 without complication/bleeding. -X-rays were obtained and reviewed with the patient. There is no evidence of acute fracture. The remaining old injury to the ankle however does not appear to be acute injury. Continue monitor. If not resolve the  next 2-3 weeks to call the office. -Diabetic shoes were dispensed today. Break in instructions were discussed. Dispensed 1 pair of shoes and 3 pairs of inserts. Once her daily for any skin irritation. If there are any current problems call the office. -Discussed daily foot inspection. If there are any changes, to call the office immediately.  -Follow-up in 3 months or sooner if any problems are to arise. In the meantime, encouraged to call the office with any questions, concerns, changes symptoms.  Celesta Gentile, DPM

## 2015-09-29 DIAGNOSIS — S42201D Unspecified fracture of upper end of right humerus, subsequent encounter for fracture with routine healing: Secondary | ICD-10-CM | POA: Diagnosis not present

## 2015-09-30 ENCOUNTER — Non-Acute Institutional Stay (SKILLED_NURSING_FACILITY): Payer: Medicare Other | Admitting: Family

## 2015-09-30 ENCOUNTER — Encounter: Payer: Self-pay | Admitting: Family

## 2015-09-30 DIAGNOSIS — G629 Polyneuropathy, unspecified: Secondary | ICD-10-CM | POA: Diagnosis not present

## 2015-09-30 DIAGNOSIS — E118 Type 2 diabetes mellitus with unspecified complications: Secondary | ICD-10-CM

## 2015-09-30 DIAGNOSIS — E785 Hyperlipidemia, unspecified: Secondary | ICD-10-CM | POA: Diagnosis not present

## 2015-09-30 DIAGNOSIS — E1165 Type 2 diabetes mellitus with hyperglycemia: Secondary | ICD-10-CM | POA: Diagnosis not present

## 2015-09-30 DIAGNOSIS — F419 Anxiety disorder, unspecified: Secondary | ICD-10-CM

## 2015-09-30 DIAGNOSIS — J4540 Moderate persistent asthma, uncomplicated: Secondary | ICD-10-CM

## 2015-09-30 DIAGNOSIS — G2581 Restless legs syndrome: Secondary | ICD-10-CM

## 2015-09-30 DIAGNOSIS — G4700 Insomnia, unspecified: Secondary | ICD-10-CM

## 2015-09-30 DIAGNOSIS — M25511 Pain in right shoulder: Secondary | ICD-10-CM | POA: Diagnosis not present

## 2015-09-30 DIAGNOSIS — I10 Essential (primary) hypertension: Secondary | ICD-10-CM | POA: Diagnosis not present

## 2015-09-30 DIAGNOSIS — K219 Gastro-esophageal reflux disease without esophagitis: Secondary | ICD-10-CM | POA: Diagnosis not present

## 2015-09-30 NOTE — Progress Notes (Signed)
Location:  [facility] Room Number: 177 Place of Service:  SNF [phone])  Provider: Marlowe Sax, FNP-C  PCP: [name], MD Patient Care Team: Lucille Passy, MD as PCP - General Chesley Mires, MD (Pulmonary Disease) Gatha Mayer, MD as Consulting Physician (Gastroenterology) Wellington Hampshire, MD as Consulting Physician (Cardiology) Inda Castle, MD as Consulting Physician (Gastroenterology) Melrose Nakayama, MD as Consulting Physician (Cardiothoracic Surgery)  Extended Emergency Contact Information Primary Emergency Contact: Esque,Tammie Address: [place], [place] [address] [place] Phone: [phone] Mobile Phone: [phone] Relation: Other Secondary Emergency Contact: Patton,Edna Address: [address]          [place], [place] [address] [place] Phone: [phone] Relation: Friend  Code Status: Full Code  Goals of care:  Advanced Directive information Advanced Directives 09/09/2015  Does patient have an advance directive? No  Type of Advance Directive -  Does patient want to make changes to advanced directive? -  Copy of advanced directive(s) in chart? -  Would patient like information on creating an advanced directive? Yes - Educational materials given     Allergies  Allergen Reactions  . Morphine And Related Other (See Comments)    Reaction:  Hypotension  . Doxycycline Hives, Swelling and Other (See Comments)    Reaction:  Facial swelling     Chief Complaint  Patient presents with  . Discharge Note    Discharged from SNF    HPI:  65 y.o. female seen today at [facility] for discharge to ALF.She has been here for short term rehabilitation post hospital admission from 09/09/15-09/11/15 with hypoglycemia and generalized weakness. She received I.V. dextrose and her oral hypoglycemics were discontinued. She was placed on SSI. She has history of recent right  humerus fracture and has a sling to her right arm.During her stay here at the Rehab she had increased episodes of crying and depression which improved with restarting her Abilify which was discontinued on her recent hospital admission.She states right shoulder pain is under control with current pain regimen. She denies any acute issues this visit. She has worked well with PT/OT but continues to require 24 hour assistance.She will be discharged to ALF with Home health PT/OT to continue with ROM, Exercise, Gait stability and muscle strengthening. She does not require any DME. Home health services will be arranged by facility social worker prior to discharge. Facility staff report no new concerns.     Past Medical History  Diagnosis Date  . Adenomatous colon polyp 08/27/2014    Polyps x 3  . Insomnia   . RLS (restless legs syndrome)   . Colon polyp   . Incontinence   . H/O: rheumatic fever   . Hyperlipidemia   . Unspecified disorders of nervous system   . Bipolar affective disorder ([place])     h/o  . H/O: CVA (cardiovascular accident)     TIA  . Fatty liver 04/09/08    found in abd CT  . Shortness of breath   . Obstructive sleep apnea     not using CPAP, last sleep study 2.5 years ago, per pt doctor is aware  . Blood transfusion 2000  . GERD (gastroesophageal reflux disease)   . Arthritis   . 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 Asthma   . Anxiety   . Depression   .  Stroke [facility])     TIA- 2002  . Ulcer   . Diabetes mellitus without complication ([place])   . Anemia   . Blood transfusion without reported diagnosis   . Sleep apnea   . Platelets decreased ([place])   . 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 Chronic abdominal pain     abdominal wall pain    Past Surgical History  Procedure Laterality Date  . Back surgery      x 45  . Cholecystectomy    . Rotator cuff repair      left  . Abdominal hysterectomy      total  . Lumbar fusion  10/09  . Hemorroidectomy      and colon  polyp removed  . Abdominal exploration surgery    . Posterior cervical fusion/foraminotomy    . Eye surgery      bilat  . Lumbar wound debridement  05/09/2011    Procedure: LUMBAR WOUND DEBRIDEMENT;  Surgeon: Dahlia Bailiff;  Location: [facility];  Service: Orthopedics;  Laterality: N/A;  IRRIGATION AND DEBRIDEMENT SPINAL WOUND  . Axillary artery cannulation via 8-mm hemashield graft, median sternotomy, extracorporeal circulation with deep hypothermic circulatory arrest, repair of  aortic dessection  01/23/2009    Dr Arlyce Dice  . Cataract      both eyes  . Colonoscopy    . Liver biopsy  04/10/2012    Procedure: LIVER BIOPSY;  Surgeon: Inda Castle, MD;  Location: [facility];  Service: Endoscopy;  Laterality: N/A;  ultrasound to mark order for abd limited/liver to be marked to be sent by [email or web address]       reports that she quit smoking about 2 years ago. Her smoking use included Cigarettes. She has a 75 pack-year smoking history. She has never used smokeless tobacco. She reports that she does not drink alcohol or use illicit drugs. Social History   Social History  . Marital Status: Single    Spouse Name: N/A  . Number of Children: 2  . Years of Education: N/A   Occupational History  . Disabled    Social History Main Topics  . Smoking status: Former Smoker -- 1.50 packs/day for 50 years    Types: Cigarettes    Quit date: 04/06/2013  . Smokeless tobacco: Never Used  . Alcohol Use: No  . Drug Use: No  . Sexual Activity: Not on file   Other Topics Concern  . Not on file   Social History Narrative   1 son, 63+ y/o   Daily caffeine use: 2 cups daily   Does not get regular exercise   Disability due to bipolar      Has a living will- she is a DNR (she has form at home per pt).   Functional Status Survey:    Allergies  Allergen Reactions  . Morphine And Related Other (See Comments)    Reaction:  Hypotension  . Doxycycline Hives, Swelling and Other (See Comments)    Reaction:   Facial swelling     Pertinent  Health Maintenance Due  Topic Date Due  . URINE MICROALBUMIN  10/15/2014  . MAMMOGRAM  01/14/2015  . FOOT EXAM  08/19/2015  . OPHTHALMOLOGY EXAM  10/02/2015  . INFLUENZA VACCINE  01/05/2016  . HEMOGLOBIN A1C  03/16/2016  . PNA vac Low Risk Adult (2 of 2 - PPSV23) 09/09/2016  . COLONOSCOPY  08/26/2017  . DEXA SCAN  Completed    Medications:   Medication List  This list is accurate as of: 09/30/15  2:36 PM.  Always use your most recent med list.               acetaminophen 325 MG tablet  Commonly known as:  TYLENOL  Take 650 mg by mouth every 4 (four) hours as needed for mild pain.     ARIPiprazole 30 MG tablet  Commonly known as:  ABILIFY  Take 30 mg by mouth daily.     budesonide-formoterol 160-4.5 MCG/ACT inhaler  Commonly known as:  SYMBICORT  Inhale 2 puffs into the lungs 2 (two) times daily.     clonazePAM 0.5 MG tablet  Commonly known as:  KLONOPIN  Take 0.5 mg by mouth 2 (two) times daily as needed for anxiety.     dicyclomine 10 MG capsule  Commonly known as:  BENTYL  Take 1 capsule (10 mg total) by mouth 3 (three) times daily before meals.     docusate sodium 100 MG capsule  Commonly known as:  COLACE  Take 100 mg by mouth 2 (two) times daily.     HYDROcodone-acetaminophen 5-325 MG tablet  Commonly known as:  NORCO/VICODIN  Take 1 tab by mouth every 4 hours as needed for moderate pain. Take 2 tabs by mouth every 4 hours as needed for severe pain.     insulin lispro 100 UNIT/ML injection  Commonly known as:  HUMALOG  Inject into the skin 3 (three) times daily before meals. 150-200= 0 units, 201-250= 5 units, 251-300= 8 units, 301-350= 9 units, >350 CALL MD     lamoTRIgine 200 MG tablet  Commonly known as:  LAMICTAL  Take 1 tablet (200 mg total) by mouth at bedtime.     lamoTRIgine 100 MG tablet  Commonly known as:  LAMICTAL  Take 1 tablet (100 mg total) by mouth every morning.     metoCLOPramide 10 MG tablet   Commonly known as:  REGLAN  Take 10 mg by mouth 4 (four) times daily as needed (chronic epigastric pain).     mirtazapine 15 MG tablet  Commonly known as:  REMERON  Take 15 mg by mouth at bedtime.     nystatin powder  Generic drug:  nystatin  Apply 1 g topically 2 (two) times daily as needed (for rash).     omeprazole 20 MG capsule  Commonly known as:  PRILOSEC  Take 40 mg by mouth daily.     oxyCODONE 5 MG immediate release tablet  Commonly known as:  Oxy IR/ROXICODONE  Take 1 - 2 tablets by mouth every 4 hours as needed for moderate to severe pain.     pregabalin 50 MG capsule  Commonly known as:  LYRICA  Take 1 capsule (50 mg total) by mouth 3 (three) times daily.     promethazine 25 MG tablet  Commonly known as:  PHENERGAN  Take 25 mg by mouth every 8 (eight) hours as needed for nausea or vomiting.     rOPINIRole 3 MG tablet  Commonly known as:  REQUIP  Take 3 mg by mouth at bedtime.     simvastatin 10 MG tablet  Commonly known as:  ZOCOR  Take 10 mg by mouth at bedtime.     tolterodine 2 MG tablet  Commonly known as:  DETROL  Take 2 mg by mouth daily.     zolpidem 5 MG tablet  Commonly known as:  AMBIEN  Take 5 mg by mouth at bedtime.  Review of Systems  Constitutional: Negative for fever, chills, activity change, appetite change and fatigue.  HENT: Negative for congestion, rhinorrhea, sinus pressure, sneezing and sore throat.   Eyes: Negative.   Respiratory: Negative for cough, chest tightness, shortness of breath and wheezing.   Cardiovascular: Negative for chest pain, palpitations and leg swelling.  Gastrointestinal: Negative.   Endocrine: Negative.   Genitourinary: Negative for dysuria, urgency and flank pain.  Musculoskeletal: Positive for gait problem.       Right shoulder pain   Skin: Negative.   Neurological: Negative for dizziness, seizures and headaches.  Psychiatric/Behavioral: Negative for suicidal ideas, confusion, sleep  disturbance, self-injury and agitation. The patient is not nervous/anxious.             Filed Vitals:   09/30/15 1356  BP: 124/64  Pulse: 64  Temp: 98.8 F (37.1 C)  Resp: 20  Height: 5' 3"  (1.6 m)  Weight: 216 lb (97.977 kg)   Body mass index is 38.27 kg/(m^2). Physical Exam  Constitutional: She is oriented to person, place, and time. She appears well-developed and well-nourished. No distress.  HENT:  Head: Normocephalic.  Mouth/Throat: Oropharynx is clear and moist.  Eyes: Conjunctivae and EOM are normal. Pupils are equal, round, and reactive to light.  Neck: Normal range of motion. No JVD present. No tracheal deviation present.  Cardiovascular: Normal rate, regular rhythm and intact distal pulses.  Exam reveals no gallop and no friction rub.   No murmur heard. Pulmonary/Chest: Effort normal and breath sounds normal. No respiratory distress. She has no wheezes. She has no rales.  Abdominal: Soft. Bowel sounds are normal. She exhibits no distension and no mass. There is no tenderness. There is no rebound and no guarding.  Musculoskeletal: She exhibits no edema or tenderness.  Normal ROM except Limited ROM to right shoulder. Right hand sling in place.  Lymphadenopathy:    She has no cervical adenopathy.  Neurological: She is oriented to person, place, and time.  Skin: Skin is warm and dry. No rash noted. No erythema. No pallor.  Psychiatric: She has a normal mood and affect.    Labs reviewed: Basic Metabolic Panel:  Recent Labs  08/18/15 1630 09/01/15 1148 09/09/15 09/09/15 1546 09/15/15  NA 139 138 136* 136 142  K 3.7 3.8  --  3.6 4.1  CL 107 112*  --  106  --   CO2 25 18*  --  25  --   GLUCOSE 77 136*  --  119*  --   BUN 12 10 14 14 19   CREATININE 0.64 0.70 0.6 0.59 0.7  CALCIUM 9.0 8.5*  --  8.6*  --    Liver Function Tests:  Recent Labs  07/14/15 1217 08/18/15 1630 09/09/15 1546 09/15/15  AST 43* 54* 37 35  ALT 26 25 17 17   ALKPHOS 198* 174* 122 204*    BILITOT 0.7 0.6 0.6  --   PROT 7.7 7.5 6.7  --   ALBUMIN 3.5 3.6 3.2*  --     Recent Labs  05/11/15 1524 05/12/15 1228 07/14/15 1217  LIPASE 25.0 25 21   No results for input(s): AMMONIA in the last 8760 hours. CBC:  Recent Labs  05/11/15 0923  08/18/15 1630 09/01/15 1148 09/09/15 09/09/15 1546 09/15/15  WBC 4.9  < > 5.5 5.3 3.6 3.6 4.6  NEUTROABS 3.3  --  4.3 3.9  --   --   --   HGB 12.1  < > 11.3* 10.8*  --  9.6* 8.9*  HCT 37.0  < > 35.5 33.9*  --  30.0* 30*  MCV 88.5  < > 83.8 84.2  --  85.4  --   PLT 146*  < > 118* 110*  --  123* 136*  < > = values in this interval not displayed. Cardiac Enzymes:  Recent Labs  02/19/15 2320 08/18/15 1630 09/09/15 1546  TROPONINI <0.03 <0.03 <0.03   BNP: Invalid input(s): POCBNP CBG:  Recent Labs  09/10/15 2148 09/11/15 0806 09/11/15 1114  GLUCAP 193* 121* 149*    Procedures and Imaging Studies During Stay: Dg Shoulder Right  09/01/2015  CLINICAL DATA:  Right shoulder pain post fall at grocery store this morning EXAM: RIGHT SHOULDER - 2+ VIEW COMPARISON:  None. FINDINGS: Four views of the right shoulder submitted. Degenerative changes are noted acromioclavicular joint. Dystrophic calcifications are noted just above the acromioclavicular joint. There is probable subtle nondisplaced fracture of the right greater humeral tuberosity. IMPRESSION: Probable subtle nondisplaced fracture of greater humeral tuberosity. Degenerative changes AC joint. Electronically Signed   By: Lahoma Crocker M.D.   On: 09/01/2015 13:37   Ct Chest W Contrast  09/01/2015  CLINICAL DATA:  [place] today in [employer].  Tripped over a cart. EXAM: CT CHEST, ABDOMEN, AND PELVIS WITH CONTRAST TECHNIQUE: Multidetector CT imaging of the chest, abdomen and pelvis was performed following the standard protocol during bolus administration of intravenous contrast. CONTRAST:  192m ISOVUE-300 IOPAMIDOL (ISOVUE-300) INJECTION 61% COMPARISON:  07/14/2015 FINDINGS: CT  CHEST FINDINGS Mediastinum/Lymph Nodes: No breast masses or chest wall hematoma. No supraclavicular or axillary adenopathy. The thyroid gland appears normal. The heart is normal in size. No pericardial effusion. No mediastinal or hilar mass or adenopathy or hematoma. The aorta is normal in caliber. No dissection. Scattered atherosclerotic calcifications. Coronary artery calcifications are no new line the esophagus is grossly normal. Lungs/Pleura: Exam is somewhat limited by breathing motion artifact. There are areas of subpleural atelectasis and areas of mild peribronchial thickening. A few patchy E 8 feet sub solid nodular densities are noted. The largest measures 12 mm in the right upper lobe on image number 25. No pulmonary contusion, pneumothorax or pleural effusion. Musculoskeletal: Cervical spine fusion hardware noted. Pole a spinal cord stimulator noted in the thoracic spine. In the sternum is intact. The thoracic vertebral bodies are normally aligned. No acute bony findings. No definite rib fractures. CT ABDOMEN PELVIS FINDINGS Hepatobiliary: Stable cirrhotic changes involving the liver. No focal hepatic lesions or acute hepatic injury. No biliary dilatation. The gallbladder is surgically absent. No common bile duct dilatation. Pancreas: No mass, inflammation or acute injury. Spleen: Stable mild splenomegaly.  No acute injury. Adrenals/Urinary Tract: The adrenal glands and kidneys are unremarkable and stable. No acute injury. Stomach/Bowel: The stomach, duodenum, small bowel and colon are grossly normal without oral contrast. No inflammatory changes, mass lesions or obstructive findings. No acute injury. No free fluid or free air. Vascular/Lymphatic: Advanced atherosclerotic calcifications involving the aorta and branch vessel ostia. No aneurysm or dissection. The major venous structures are patent. No mesenteric or retroperitoneal mass, adenopathy or hematoma. Reproductive: The uterus and ovaries are  surgically absent. Other: No pelvic mass or adenopathy. No free pelvic fluid collections. The bladder is normal. No inguinal mass or adenopathy. No abdominal wall hernia or subcutaneous lesions. Surgical changes from prior spinal surgery. Musculoskeletal: No acute bony findings. Stable surgical changes involving the lumbar spine. No acute fracture. The bony pelvis is intact. The pubic symphysis and SI joints appear  normal. Both hips are normally located. No hip fracture. IMPRESSION: 1. No acute injury involving the chest, abdomen or pelvis. 2. Mild emphysematous changes in the lungs and patchy airspace nodules. This could reflect inflammatory change but recommend followup noncontrast chest CT in 3 months. 3. Stable cirrhotic changes involving the liver with portal venous hypertension and portal venous collaterals. Stable splenomegaly. 4. No acute bony findings. Electronically Signed   By: Marijo Sanes M.D.   On: 09/01/2015 14:36   Ct Cervical Spine Wo Contrast  09/01/2015  CLINICAL DATA:  Recent fall with neck pain, initial encounter EXAM: CT CERVICAL SPINE WITHOUT CONTRAST TECHNIQUE: Multidetector CT imaging of the cervical spine was performed without intravenous contrast. Multiplanar CT image reconstructions were also generated. COMPARISON:  03/21/2015 FINDINGS: Seven cervical segments are well visualized. Changes of prior fusion from C5-C7 are seen. Facet hypertrophic changes are noted at all levels with varying degrees of neural foraminal narrowing. No acute fracture or acute facet abnormality is noted. The surrounding soft tissues are within normal limits. IMPRESSION: Multilevel degenerative changes and postoperative changes. No acute bony abnormality is noted. Electronically Signed   By: [name] Catalina M.D.   On: 09/01/2015 13:14   Ct Abdomen Pelvis W Contrast  09/01/2015  CLINICAL DATA:  [place] today in [employer].  Tripped over a cart. EXAM: CT CHEST, ABDOMEN, AND PELVIS WITH CONTRAST TECHNIQUE:  Multidetector CT imaging of the chest, abdomen and pelvis was performed following the standard protocol during bolus administration of intravenous contrast. CONTRAST:  149m ISOVUE-300 IOPAMIDOL (ISOVUE-300) INJECTION 61% COMPARISON:  07/14/2015 FINDINGS: CT CHEST FINDINGS Mediastinum/Lymph Nodes: No breast masses or chest wall hematoma. No supraclavicular or axillary adenopathy. The thyroid gland appears normal. The heart is normal in size. No pericardial effusion. No mediastinal or hilar mass or adenopathy or hematoma. The aorta is normal in caliber. No dissection. Scattered atherosclerotic calcifications. Coronary artery calcifications are no new line the esophagus is grossly normal. Lungs/Pleura: Exam is somewhat limited by breathing motion artifact. There are areas of subpleural atelectasis and areas of mild peribronchial thickening. A few patchy E 8 feet sub solid nodular densities are noted. The largest measures 12 mm in the right upper lobe on image number 25. No pulmonary contusion, pneumothorax or pleural effusion. Musculoskeletal: Cervical spine fusion hardware noted. Pole a spinal cord stimulator noted in the thoracic spine. In the sternum is intact. The thoracic vertebral bodies are normally aligned. No acute bony findings. No definite rib fractures. CT ABDOMEN PELVIS FINDINGS Hepatobiliary: Stable cirrhotic changes involving the liver. No focal hepatic lesions or acute hepatic injury. No biliary dilatation. The gallbladder is surgically absent. No common bile duct dilatation. Pancreas: No mass, inflammation or acute injury. Spleen: Stable mild splenomegaly.  No acute injury. Adrenals/Urinary Tract: The adrenal glands and kidneys are unremarkable and stable. No acute injury. Stomach/Bowel: The stomach, duodenum, small bowel and colon are grossly normal without oral contrast. No inflammatory changes, mass lesions or obstructive findings. No acute injury. No free fluid or free air. Vascular/Lymphatic:  Advanced atherosclerotic calcifications involving the aorta and branch vessel ostia. No aneurysm or dissection. The major venous structures are patent. No mesenteric or retroperitoneal mass, adenopathy or hematoma. Reproductive: The uterus and ovaries are surgically absent. Other: No pelvic mass or adenopathy. No free pelvic fluid collections. The bladder is normal. No inguinal mass or adenopathy. No abdominal wall hernia or subcutaneous lesions. Surgical changes from prior spinal surgery. Musculoskeletal: No acute bony findings. Stable surgical changes involving the lumbar spine. No  acute fracture. The bony pelvis is intact. The pubic symphysis and SI joints appear normal. Both hips are normally located. No hip fracture. IMPRESSION: 1. No acute injury involving the chest, abdomen or pelvis. 2. Mild emphysematous changes in the lungs and patchy airspace nodules. This could reflect inflammatory change but recommend followup noncontrast chest CT in 3 months. 3. Stable cirrhotic changes involving the liver with portal venous hypertension and portal venous collaterals. Stable splenomegaly. 4. No acute bony findings. Electronically Signed   By: Marijo Sanes M.D.   On: 09/01/2015 14:36   Dg Chest Portable 1 View  09/09/2015  CLINICAL DATA:  65 year old female with chest pain and lethargy. Initial encounter. EXAM: PORTABLE CHEST 1 VIEW COMPARISON:  CT chest abdomen and pelvis 09/01/2015 and earlier. FINDINGS: Portable AP upright view at 1531 hours. Lordotic view. Chronic lower thoracic spinal stimulator and lower cervical ACDF hardware. Stable cardiac size and mediastinal contours. No pneumothorax, pleural effusion or consolidation identified portably. Increased pulmonary vascular congestion, no overt edema suspected at this time. Visualized tracheal air column is within normal limits. IMPRESSION: Increased pulmonary vascularity without overt edema. Electronically Signed   By: Genevie Ann M.D.   On: 09/09/2015 15:50   Dg  Humerus Right  09/01/2015  CLINICAL DATA:  Pain following fall earlier today EXAM: RIGHT HUMERUS - 2+ VIEW COMPARISON:  None. FINDINGS: Frontal and lateral views were obtained. There is a fracture of the proximal right humeral metaphysis with subtle impaction at the fracture site. No other fracture. No dislocation. The joint spaces appear unremarkable. Note that there is calcification in the superior acromioclavicular joint on the right. IMPRESSION: Impacted fracture proximal humeral metaphysis with alignment near anatomic. No other fracture. No dislocation. Calcification is noted in the superior aspect of the right acromioclavicular joint. Electronically Signed   By: Lowella Grip III M.D.   On: 09/01/2015 13:36    Assessment/Plan:   HTN B/p stable. Continue to monitor. BMP in 1-2 weeks with PCP   Asthma  Stable. Continue Symbicort 160-4.5 mcg/inhaler GERD Asymptomatic. Continue on omeprazole 40 mg capsule  Type 2 DM Status post short term rehabilitation post hospital admission from 09/09/15-09/11/15 with hypoglycemia and generalized weakness. She received I.V. dextrose and her oral hypoglycemics were discontinued. She was placed on SSI.CBG's stable. Continue on Humalog per SSI and Metformin 500 mg Tablet. Monitor Hgb A1C. Continue on Statin. Consider ACEI /ARB for renal protections if B/p remains stable.  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 Continue to control high risk factors. Continue on Gabapentin.   RLS Continue on ropinirole 3 mg tablet    Hyperlipidemia  Continue on simvastatin 10 mg Tablet. Monitor lipid panel.   Anxiety No episodes of crying. Continue on Klonopin 0.5 mg tablet   Insomnia  Continue on Zolpidem 5 mg Tablet. Consider non-sedative med due to high risk for falls.   Right shoulder pain  Current pain regimen effective. Wean off pain med as tolerated. Continue to follow up with Ortho specialist as directed.    Patient is being discharged with the  following home health services:    PT/OT to continue with ROM, Exercise, Gait stability and muscle strengthening. Patient is being discharged with the following durable medical equipment:   None required.   Patient has been advised to f/u with their PCP in 1-2 weeks to bring them up to date on their rehab stay.  Social services at facility was responsible for arranging this appointment.  Pt was provided with a 30 day  supply of prescriptions for medications and refills must be obtained from their PCP.  For controlled substances, a more limited supply may be provided adequate until PCP appointment only.  Future labs/tests needed:  CBC, BMP, Hgb A1C in 1-2 weeks with PCP

## 2015-10-05 DIAGNOSIS — G2581 Restless legs syndrome: Secondary | ICD-10-CM | POA: Diagnosis not present

## 2015-10-05 DIAGNOSIS — I10 Essential (primary) hypertension: Secondary | ICD-10-CM | POA: Diagnosis not present

## 2015-10-05 DIAGNOSIS — J449 Chronic obstructive pulmonary disease, unspecified: Secondary | ICD-10-CM | POA: Diagnosis not present

## 2015-10-06 DIAGNOSIS — Z79899 Other long term (current) drug therapy: Secondary | ICD-10-CM | POA: Diagnosis not present

## 2015-10-08 DIAGNOSIS — R609 Edema, unspecified: Secondary | ICD-10-CM | POA: Diagnosis not present

## 2015-10-08 DIAGNOSIS — M2550 Pain in unspecified joint: Secondary | ICD-10-CM | POA: Diagnosis not present

## 2015-10-08 DIAGNOSIS — R269 Unspecified abnormalities of gait and mobility: Secondary | ICD-10-CM | POA: Diagnosis not present

## 2015-10-10 IMAGING — CR DG CHEST 2V
1 series · 2 of 2 positions shown · non-contrast
Comparison: Chest radiograph 04/21/2014.

CLINICAL DATA: Patient with sudden onset chest pain.

EXAM:
CHEST  2 VIEW

[Series 1: dg chest 2 view · 0.14mm/px · 2 of 2 slices shown]
[im 1/2]
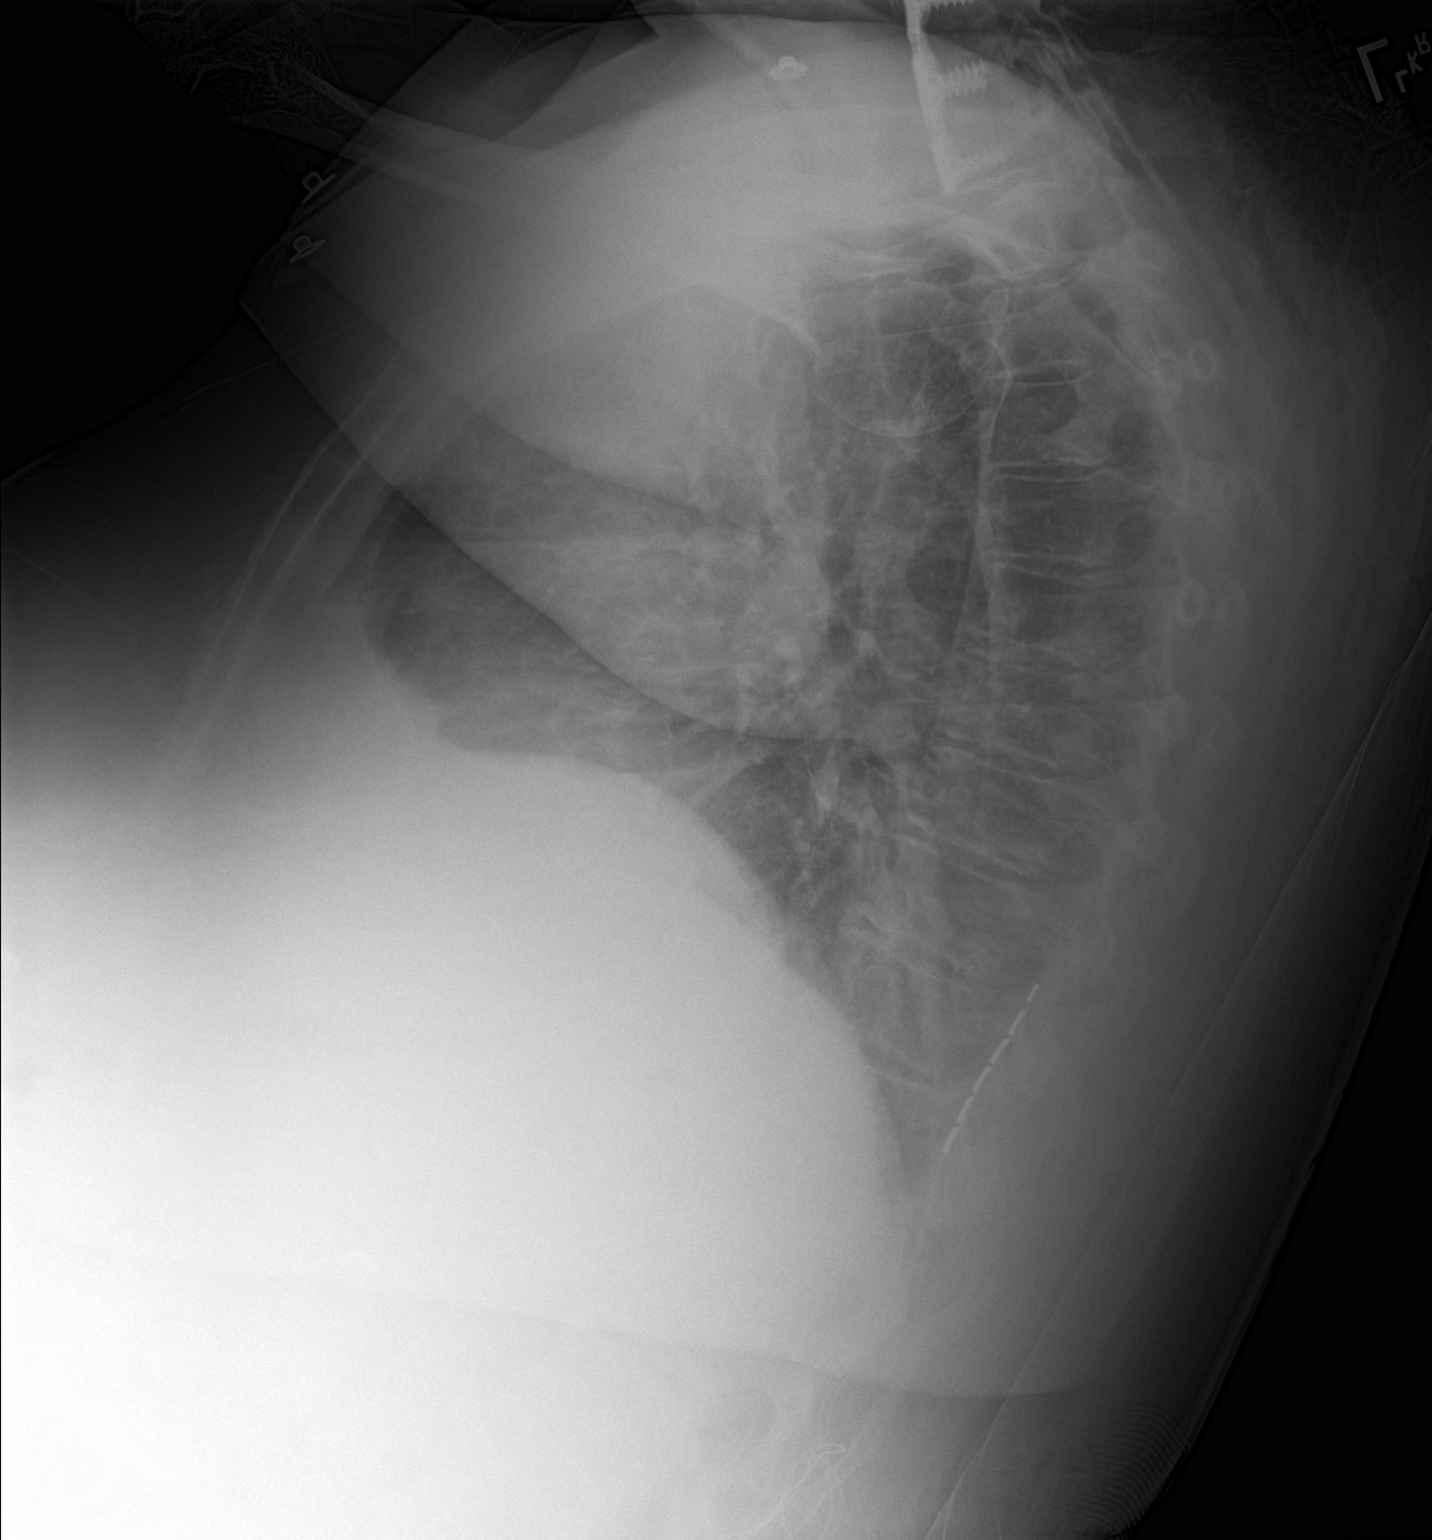
[im 2/2]
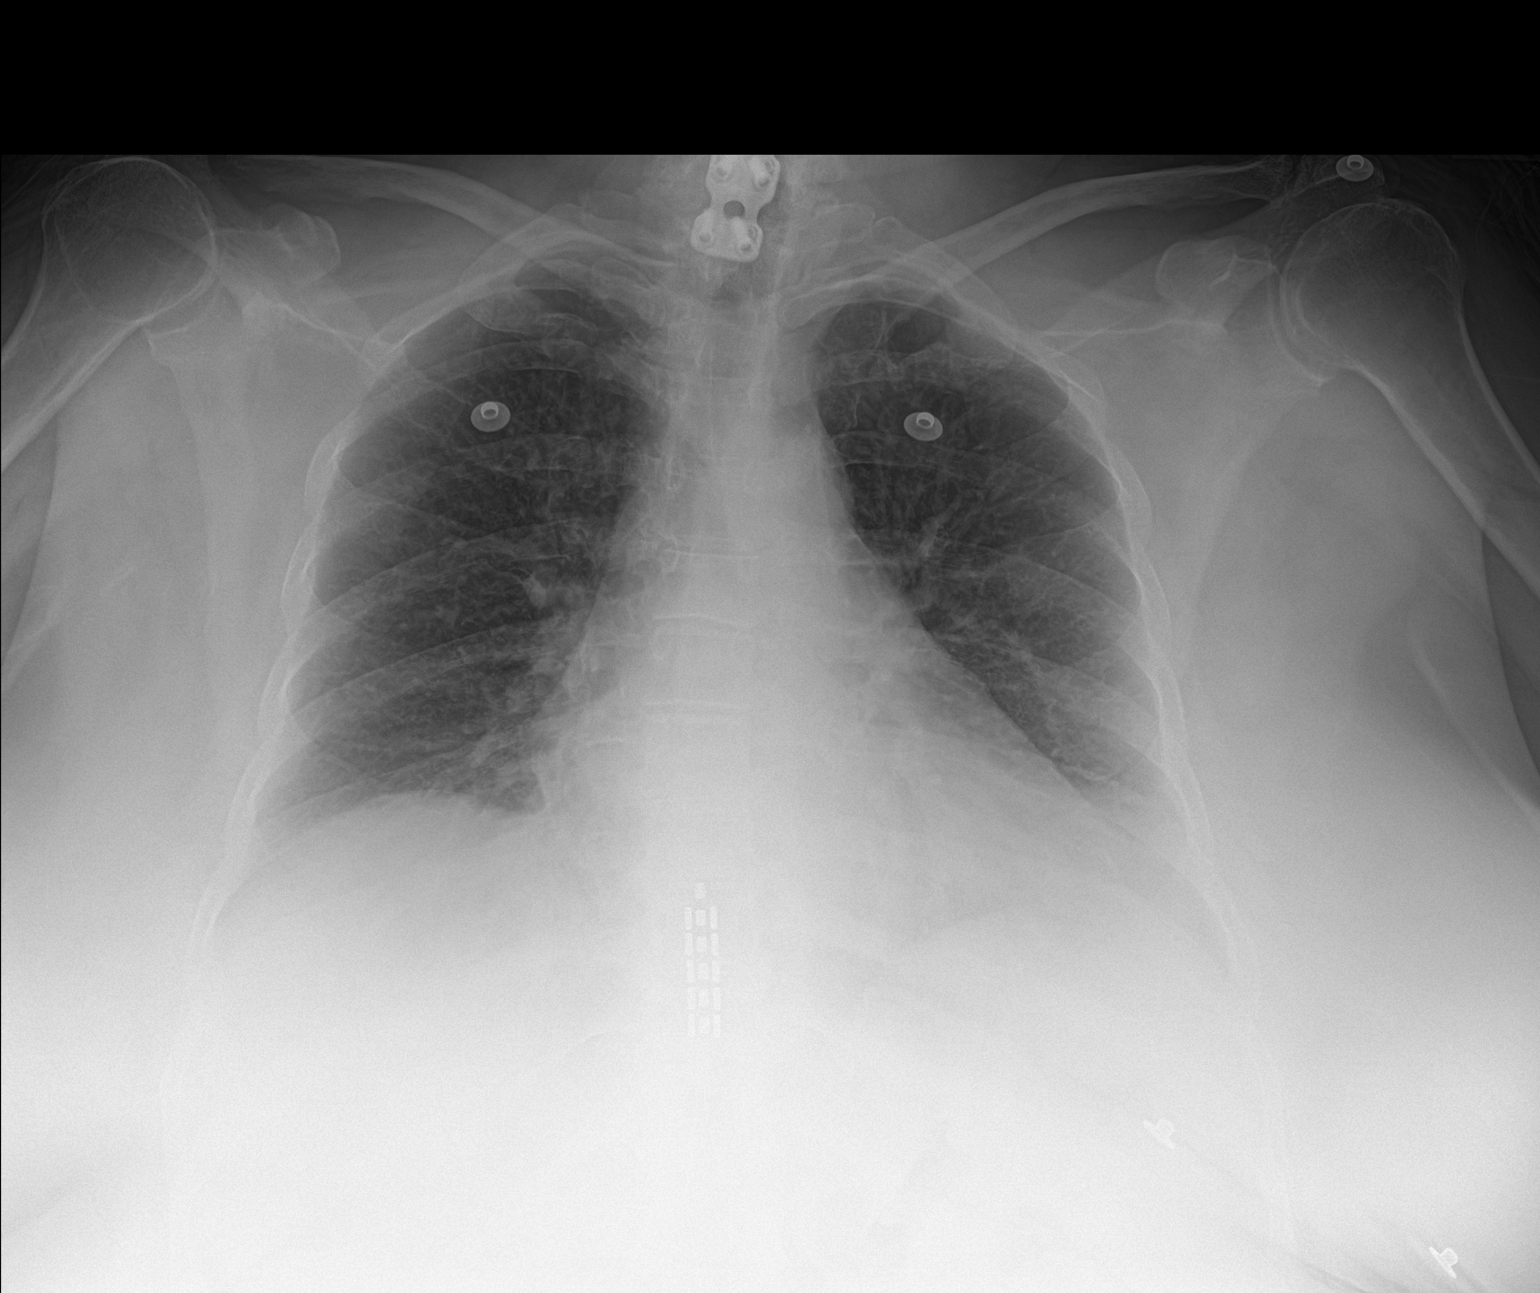

[2 of 2 positions shown; findings below may reference images not displayed]

FINDINGS: Anterior cervical spinal fusion hardware. Stable enlarged cardiac
and mediastinal contours. No consolidative pulmonary opacities. No
pleural effusion or pneumothorax. Mid thoracic spine degenerative
changes. Spinal stimulator in place.
IMPRESSION: Cardiomegaly.  No acute cardiopulmonary process.

## 2015-10-10 IMAGING — CT CT ANGIO CHEST
1 of 2 series · 18 of 30 positions shown · IV contrast (APPLIED)
Comparison: Radiographs earlier this day. Chest CT 06/24/2014,
multiple prior chest CT

CLINICAL DATA: Sudden onset of chest pain earlier today. Pleuritic
chest pain, right side radiating to the neck and right arm.

EXAM:
CT ANGIOGRAPHY CHEST WITH CONTRAST
TECHNIQUE: Multidetector CT imaging of the chest was performed using the
standard protocol during bolus administration of intravenous
contrast. Multiplanar CT image reconstructions and MIPs were
obtained to evaluate the vascular anatomy.
CONTRAST:  100mL OMNIPAQUE IOHEXOL 350 MG/ML SOLN

[Series 5: pe 1.0 thins · axial · 0.74mm/px · z∈[-544,-324]mm · 18 of 249 slices shown]
[im 14/249  lung]
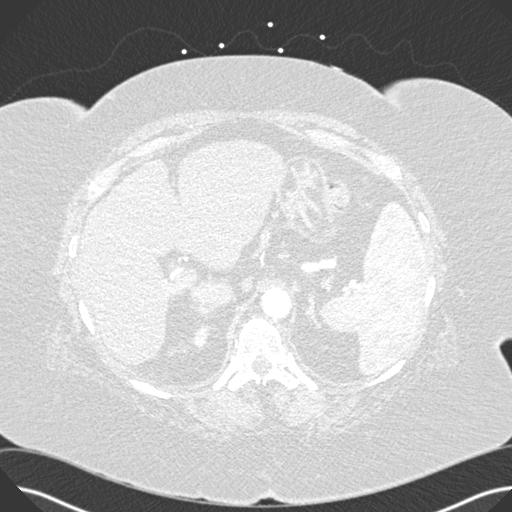
[im 28/249  mediastinal]
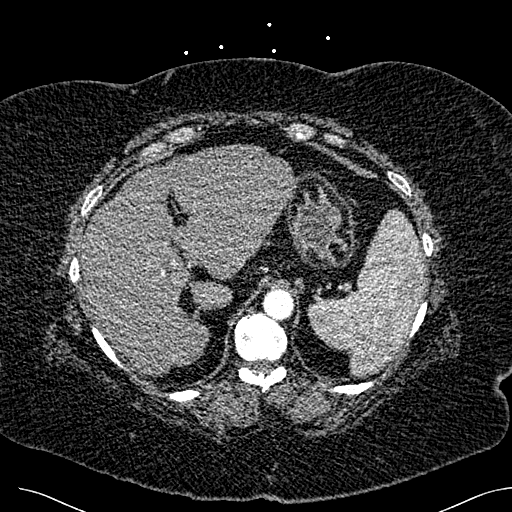
[im 42/249  lung]
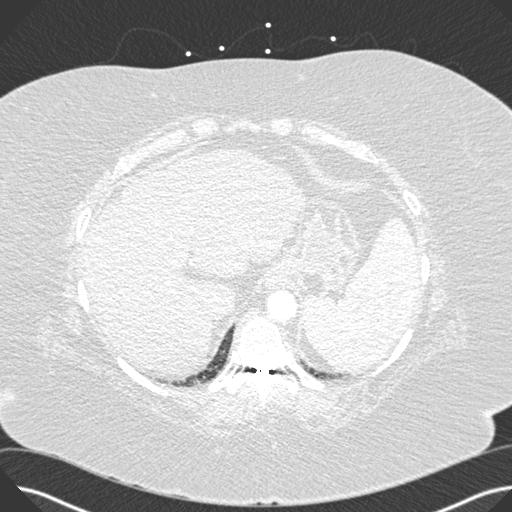
[im 56/249  mediastinal]
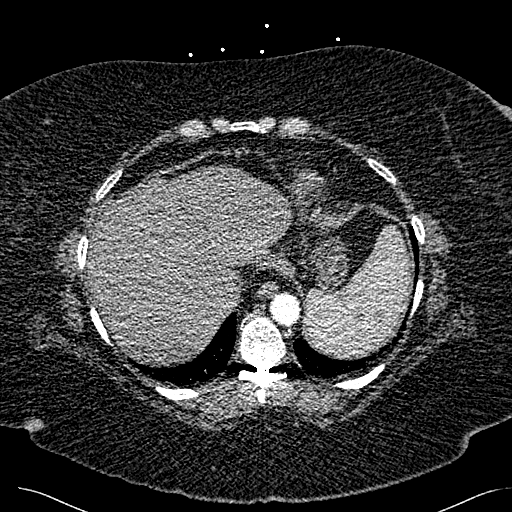
[im 69/249  lung]
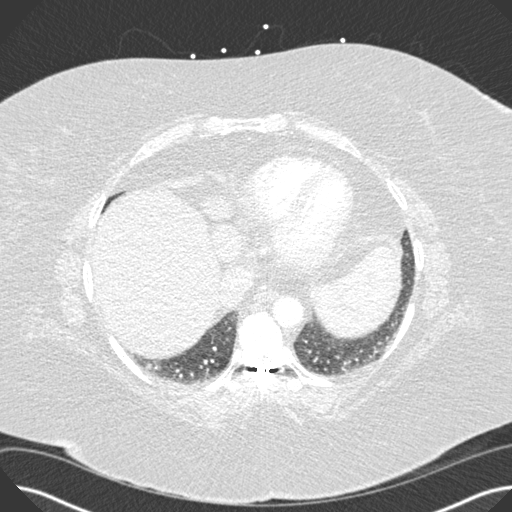
[im 83/249  mediastinal]
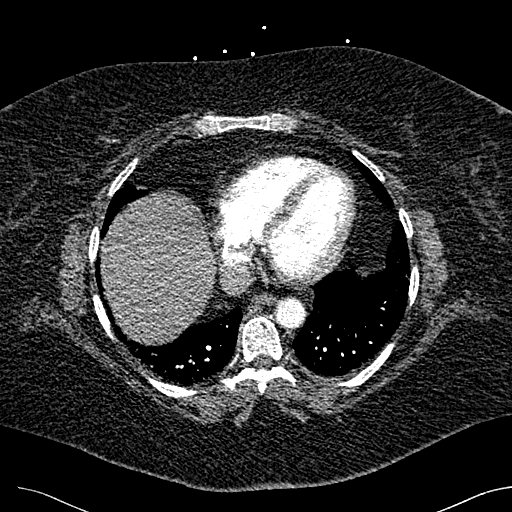
[im 97/249  lung]
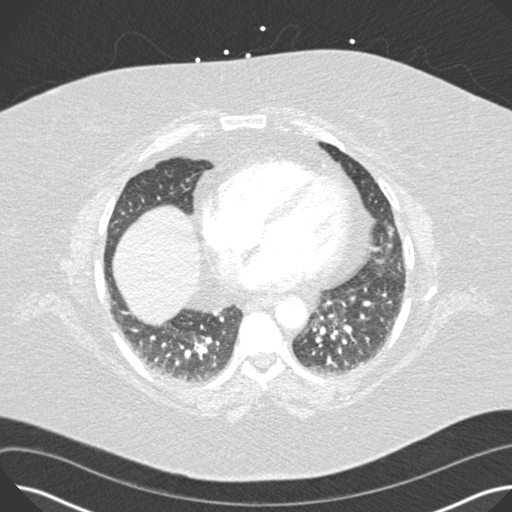
[im 111/249  mediastinal]
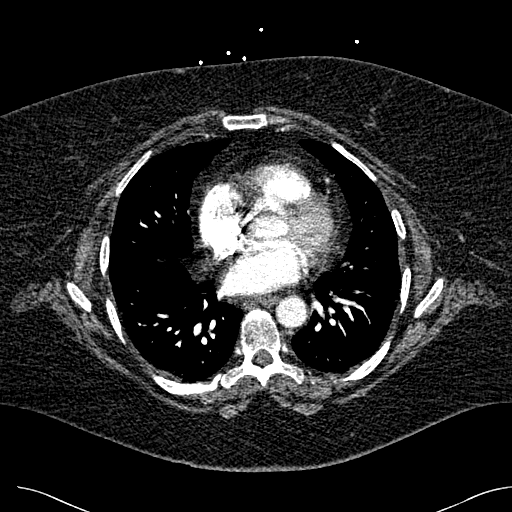
[im 115/249  lung]
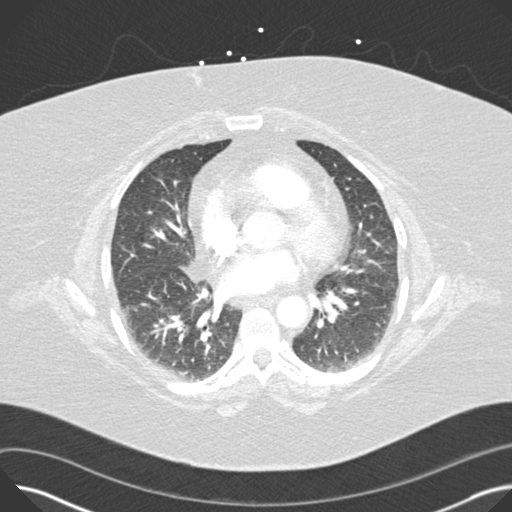
[im 125/249  mediastinal]
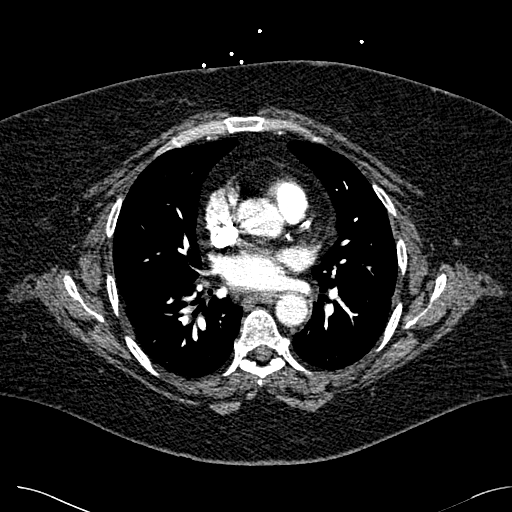
[im 138/249  lung]
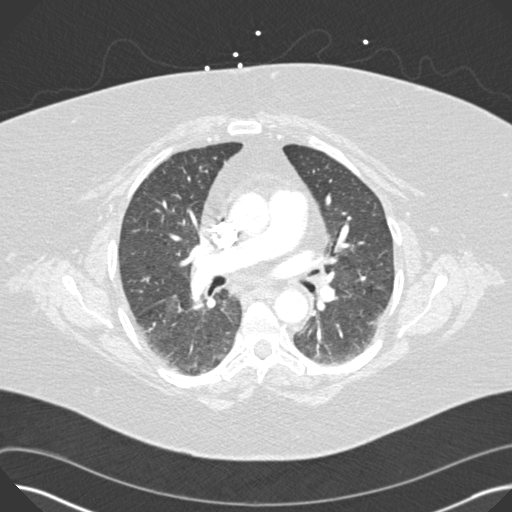
[im 152/249  mediastinal]
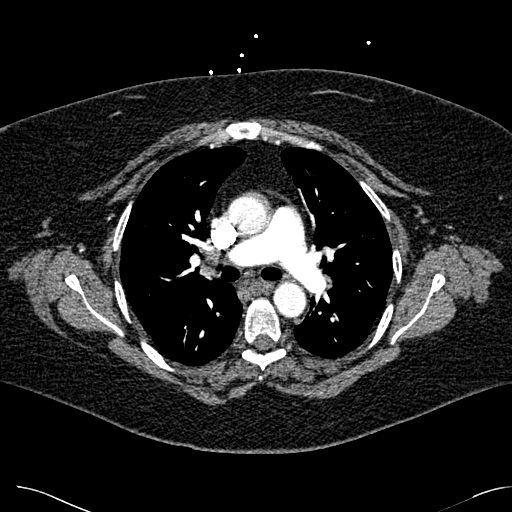
[im 166/249  lung]
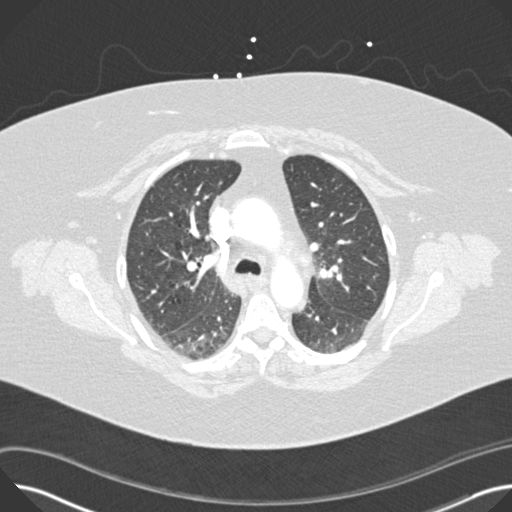
[im 180/249  mediastinal]
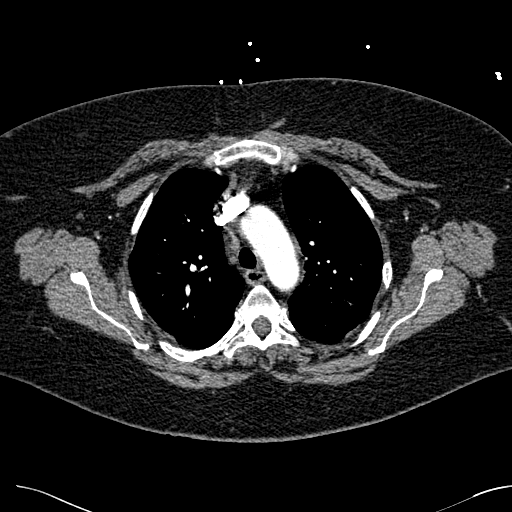
[im 193/249  lung]
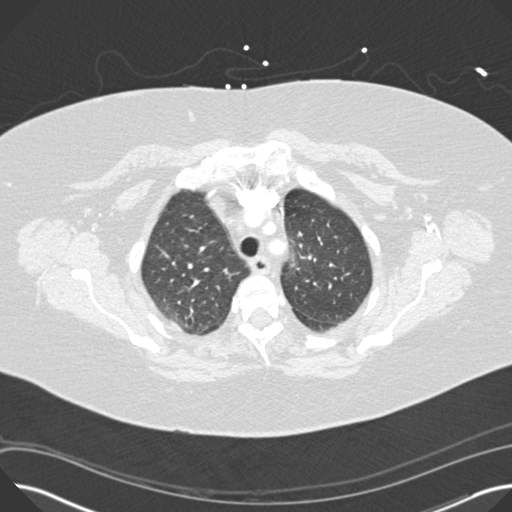
[im 207/249  mediastinal]
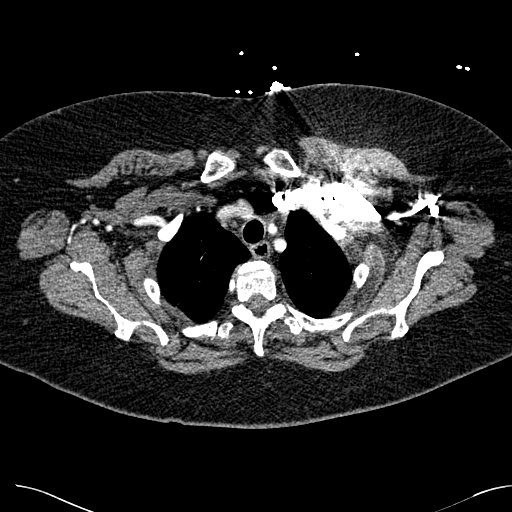
[im 221/249  lung]
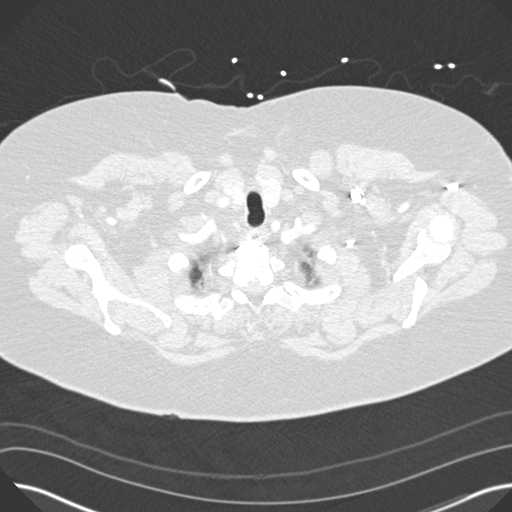
[im 235/249  mediastinal]
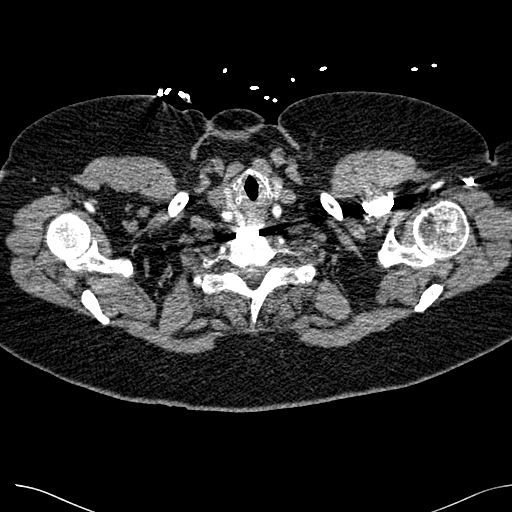

[18 of 30 positions shown; findings below may reference images not displayed]

FINDINGS: There are no filling defects within the pulmonary arteries to
suggest pulmonary embolus.

The thoracic aorta is normal in caliber without dissection or
aneurysm. Conventional branching pattern from the aortic arch. Heart
appears upper normal in size, there is mediastinal lipomatosis.
Coronary artery calcifications are seen. There is no mediastinal or
hilar adenopathy.

Right apical ground-glass nodule measures 7 mm, unchanged from prior
exams. No new or additional pulmonary nodule. Minimal fissural
thickening involving the right major fissure versus intrapulmonary
lymph node. No confluent airspace disease. Mild right lower lobe
linear atelectasis. Trachea and mainstem bronchi are patent. No
pulmonary edema.

The thyroid gland and esophagus are normal. There is no acute
abnormality in the included upper abdomen. The included liver
demonstrates lobular contours.

There are no acute or suspicious osseous abnormalities. Spinal
stimulator is in place. Postsurgical change in the lower cervical
spine, partially included.

Review of the MIP images confirms the above findings.
IMPRESSION: 1. No pulmonary embolus or acute intrathoracic process.
2. Coronary artery calcifications.
3. Right apical ground-glass nodule measures 7 mm, unchanged from
prior exams. There is overall imaging stability over the course of 4
years, suggesting nonaggressive etiology such as scarring.

## 2015-10-12 DIAGNOSIS — S42201D Unspecified fracture of upper end of right humerus, subsequent encounter for fracture with routine healing: Secondary | ICD-10-CM | POA: Diagnosis not present

## 2015-10-15 ENCOUNTER — Ambulatory Visit: Payer: Medicare Other | Admitting: Family Medicine

## 2015-10-15 DIAGNOSIS — F5109 Other insomnia not due to a substance or known physiological condition: Secondary | ICD-10-CM | POA: Diagnosis not present

## 2015-10-15 DIAGNOSIS — R269 Unspecified abnormalities of gait and mobility: Secondary | ICD-10-CM | POA: Diagnosis not present

## 2015-10-15 DIAGNOSIS — R5381 Other malaise: Secondary | ICD-10-CM | POA: Diagnosis not present

## 2015-10-15 DIAGNOSIS — M2550 Pain in unspecified joint: Secondary | ICD-10-CM | POA: Diagnosis not present

## 2015-10-16 DIAGNOSIS — S42201D Unspecified fracture of upper end of right humerus, subsequent encounter for fracture with routine healing: Secondary | ICD-10-CM | POA: Diagnosis not present

## 2015-10-16 DIAGNOSIS — F419 Anxiety disorder, unspecified: Secondary | ICD-10-CM | POA: Diagnosis not present

## 2015-10-16 DIAGNOSIS — I10 Essential (primary) hypertension: Secondary | ICD-10-CM | POA: Diagnosis not present

## 2015-10-16 DIAGNOSIS — E785 Hyperlipidemia, unspecified: Secondary | ICD-10-CM | POA: Diagnosis not present

## 2015-10-16 DIAGNOSIS — K7460 Unspecified cirrhosis of liver: Secondary | ICD-10-CM | POA: Diagnosis not present

## 2015-10-16 DIAGNOSIS — G4733 Obstructive sleep apnea (adult) (pediatric): Secondary | ICD-10-CM | POA: Diagnosis not present

## 2015-10-16 DIAGNOSIS — J449 Chronic obstructive pulmonary disease, unspecified: Secondary | ICD-10-CM | POA: Diagnosis not present

## 2015-10-16 DIAGNOSIS — G2581 Restless legs syndrome: Secondary | ICD-10-CM | POA: Diagnosis not present

## 2015-10-16 DIAGNOSIS — Z8719 Personal history of other diseases of the digestive system: Secondary | ICD-10-CM | POA: Diagnosis not present

## 2015-10-16 DIAGNOSIS — K219 Gastro-esophageal reflux disease without esophagitis: Secondary | ICD-10-CM | POA: Diagnosis not present

## 2015-10-16 DIAGNOSIS — E1140 Type 2 diabetes mellitus with diabetic neuropathy, unspecified: Secondary | ICD-10-CM | POA: Diagnosis not present

## 2015-10-19 ENCOUNTER — Encounter: Payer: Self-pay | Admitting: Medical Oncology

## 2015-10-19 ENCOUNTER — Emergency Department
Admission: EM | Admit: 2015-10-19 | Discharge: 2015-10-19 | Disposition: A | Discharge status: Home / Self Care | Payer: Medicare Other | Attending: Emergency Medicine | Admitting: Emergency Medicine

## 2015-10-19 DIAGNOSIS — E162 Hypoglycemia, unspecified: Secondary | ICD-10-CM | POA: Diagnosis not present

## 2015-10-19 DIAGNOSIS — R682 Dry mouth, unspecified: Secondary | ICD-10-CM | POA: Insufficient documentation

## 2015-10-19 DIAGNOSIS — F319 Bipolar disorder, unspecified: Secondary | ICD-10-CM | POA: Diagnosis not present

## 2015-10-19 DIAGNOSIS — E1140 Type 2 diabetes mellitus with diabetic neuropathy, unspecified: Secondary | ICD-10-CM | POA: Diagnosis not present

## 2015-10-19 DIAGNOSIS — E11649 Type 2 diabetes mellitus with hypoglycemia without coma: Secondary | ICD-10-CM | POA: Diagnosis not present

## 2015-10-19 DIAGNOSIS — E785 Hyperlipidemia, unspecified: Secondary | ICD-10-CM | POA: Insufficient documentation

## 2015-10-19 DIAGNOSIS — R3589 Other polyuria: Secondary | ICD-10-CM

## 2015-10-19 DIAGNOSIS — Z87891 Personal history of nicotine dependence: Secondary | ICD-10-CM | POA: Insufficient documentation

## 2015-10-19 DIAGNOSIS — Z79899 Other long term (current) drug therapy: Secondary | ICD-10-CM | POA: Diagnosis not present

## 2015-10-19 DIAGNOSIS — M1990 Unspecified osteoarthritis, unspecified site: Secondary | ICD-10-CM | POA: Insufficient documentation

## 2015-10-19 DIAGNOSIS — J45909 Unspecified asthma, uncomplicated: Secondary | ICD-10-CM | POA: Diagnosis not present

## 2015-10-19 DIAGNOSIS — R358 Other polyuria: Secondary | ICD-10-CM | POA: Diagnosis not present

## 2015-10-19 DIAGNOSIS — Z8673 Personal history of transient ischemic attack (TIA), and cerebral infarction without residual deficits: Secondary | ICD-10-CM | POA: Insufficient documentation

## 2015-10-19 DIAGNOSIS — G894 Chronic pain syndrome: Secondary | ICD-10-CM | POA: Diagnosis not present

## 2015-10-19 DIAGNOSIS — I10 Essential (primary) hypertension: Secondary | ICD-10-CM | POA: Insufficient documentation

## 2015-10-19 DIAGNOSIS — K219 Gastro-esophageal reflux disease without esophagitis: Secondary | ICD-10-CM | POA: Diagnosis not present

## 2015-10-19 DIAGNOSIS — J449 Chronic obstructive pulmonary disease, unspecified: Secondary | ICD-10-CM | POA: Diagnosis not present

## 2015-10-19 DIAGNOSIS — R739 Hyperglycemia, unspecified: Secondary | ICD-10-CM

## 2015-10-19 DIAGNOSIS — E1165 Type 2 diabetes mellitus with hyperglycemia: Secondary | ICD-10-CM | POA: Diagnosis not present

## 2015-10-19 DIAGNOSIS — Z794 Long term (current) use of insulin: Secondary | ICD-10-CM | POA: Diagnosis not present

## 2015-10-19 LAB — URINALYSIS COMPLETE WITH MICROSCOPIC (ARMC ONLY)
BACTERIA UA: NONE SEEN
Bilirubin Urine: NEGATIVE
Glucose, UA: >500 mg/dL — AB
Hgb urine dipstick: NEGATIVE
Ketones, ur: NEGATIVE mg/dL
Leukocytes, UA: NEGATIVE
Nitrite: NEGATIVE
PROTEIN: NEGATIVE mg/dL
SPECIFIC GRAVITY, URINE: 1.013 (ref 1.005–1.030)
pH: 7 (ref 5.0–8.0)

## 2015-10-19 LAB — BASIC METABOLIC PANEL
ANION GAP: 6 (ref 5–15)
BUN: 11 mg/dL (ref 6–20)
CHLORIDE: 108 mmol/L (ref 101–111)
CO2: 25 mmol/L (ref 22–32)
Calcium: 8.9 mg/dL (ref 8.9–10.3)
Creatinine, Ser: 0.71 mg/dL (ref 0.44–1.00)
GFR calc non Af Amer: >60 mL/min (ref >60)
GLUCOSE: 321 mg/dL — AB (ref 65–99)
POTASSIUM: 3.7 mmol/L (ref 3.5–5.1)
Sodium: 139 mmol/L (ref 135–145)

## 2015-10-19 LAB — GLUCOSE, CAPILLARY: GLUCOSE-CAPILLARY: 103 mg/dL — AB (ref 65–99)

## 2015-10-19 LAB — CBC
HEMATOCRIT: 31.6 % — AB (ref 35.0–47.0)
HEMOGLOBIN: 10.1 g/dL — AB (ref 12.0–16.0)
MCH: 26.6 pg (ref 26.0–34.0)
MCHC: 32 g/dL (ref 32.0–36.0)
MCV: 83.1 fL (ref 80.0–100.0)
Platelets: 125 10*3/uL — ABNORMAL LOW (ref 150–440)
RBC: 3.8 MIL/uL (ref 3.80–5.20)
RDW: 17.4 % — ABNORMAL HIGH (ref 11.5–14.5)
WBC: 3.8 10*3/uL (ref 3.6–11.0)

## 2015-10-19 MED ORDER — SODIUM CHLORIDE 0.9 % IV BOLUS (SEPSIS)
1000.0000 mL | Freq: Once | INTRAVENOUS | Status: AC
  Administered 2015-10-19: 1000 mL via INTRAVENOUS

## 2015-10-19 MED ORDER — INSULIN ASPART 100 UNIT/ML ~~LOC~~ SOLN
10.0000 [IU] | Freq: Once | SUBCUTANEOUS | Status: AC
  Administered 2015-10-19: 10 [IU] via SUBCUTANEOUS
  Filled 2015-10-19: qty 10

## 2015-10-19 NOTE — ED Provider Notes (Signed)
[facility] Emergency Department Provider Note  ____________________________________________  Time seen: Approximately 5:42 PM  I have reviewed the triage vital signs and the nursing notes.   HISTORY  Chief Complaint Hyperglycemia    HPI Stephanie [name] is a 65 y.o. female who lives at the [facility] with a history of diabetes on insulin presenting for hyperglycemia. The patient reports that 2 mornings ago, her blood sugar was 66, and her insulin was held. Yesterday was 270 and her insulin was held again. This morning her blood sugar was 495 and she had been developing a dry mouth with thirst and increased urination, and again they held her insulin. She states "if my blood sugar is over 270, they're not allowed to give me my insulin." The patient denies any recent illness including cough, cold, fever, chills, nausea vomiting or diarrhea, or abdominal pain. She has not been expecting any chest pain. She does not know out of insulin she takes.   Past Medical History  Diagnosis Date  . Adenomatous colon polyp 08/27/2014    Polyps x 3  . Insomnia   . RLS (restless legs syndrome)   . Colon polyp   . Incontinence   . H/O: rheumatic fever   . Hyperlipidemia   . Unspecified disorders of nervous system   . Bipolar affective disorder ([place])     h/o  . H/O: CVA (cardiovascular accident)     TIA  . Fatty liver 04/09/08    found in abd CT  . Shortness of breath   . Obstructive sleep apnea     not using CPAP, last sleep study 2.5 years ago, per pt doctor is aware  . Blood transfusion 2000  . GERD (gastroesophageal reflux disease)   . Arthritis   . 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 Asthma   . Anxiety   . Depression   . Stroke [facility])     TIA- 2002  . Ulcer   . Diabetes mellitus without complication ([place])   . Anemia   . Blood transfusion without reported diagnosis   . Sleep apnea   . Platelets decreased ([place])   . Cirrhosis ([place])     related to NASH  .  Chronic abdominal pain     abdominal wall pain    Patient Active Problem List   Diagnosis Date Noted  . Hypoglycemia 09/09/2015  . Hyperglycemia 07/16/2015  . Neuropathy ([place]) 06/08/2015  . Thrombocytopenia ([place]) 08/19/2014  . Abdominal pain, chronic, epigastric 06/12/2014  . Liver cirrhosis secondary to NASH 02/27/2014  . Dizziness and giddiness 10/30/2013  . DOE (dyspnea on exertion) 10/30/2013  . Poorly controlled type 2 diabetes mellitus with complication ([place]) 35/59/7416  . DNR (do not resuscitate) 07/29/2013  . Encounter for routine gynecological examination 07/29/2013  . Dyspnea 07/10/2013  . Lung nodule 07/10/2013  . Varicose veins of lower extremities with other complications 38/45/3646  . GERD (gastroesophageal reflux disease) 10/09/2012  . Cirrhosis of liver ([place]) 02/02/2012  . Anxiety 07/11/2011  . Aortic dissection ([place])   . BACK PAIN, LUMBAR, CHRONIC 11/19/2009  . UNS ADVRS EFF OTH RX MEDICINAL&BIOLOGICAL SBSTNC 12/16/2008  . UNSPECIFIED VITAMIN D DEFICIENCY 09/26/2008  . TOBACCO ABUSE 06/19/2008  . RESTLESS LEGS SYNDROME 04/27/2007  . INSOMNIA 04/27/2007  . HLD (hyperlipidemia) 03/08/2007  . OSA (obstructive sleep apnea) 03/08/2007  . Essential hypertension 03/08/2007  . Asthma 03/08/2007  . BIPOLAR AFFECTIVE DISORDER, HX OF 03/08/2007    Past Surgical History  Procedure  Laterality Date  . Back surgery      x 45  . Cholecystectomy    . Rotator cuff repair      left  . Abdominal hysterectomy      total  . Lumbar fusion  10/09  . Hemorroidectomy      and colon polyp removed  . Abdominal exploration surgery    . Posterior cervical fusion/foraminotomy    . Eye surgery      bilat  . Lumbar wound debridement  05/09/2011    Procedure: LUMBAR WOUND DEBRIDEMENT;  Surgeon: Dahlia Bailiff;  Location: [facility];  Service: Orthopedics;  Laterality: N/A;  IRRIGATION AND DEBRIDEMENT SPINAL WOUND  . Axillary artery cannulation via 8-mm hemashield graft, median  sternotomy, extracorporeal circulation with deep hypothermic circulatory arrest, repair of  aortic dessection  01/23/2009    Dr Arlyce Dice  . Cataract      both eyes  . Colonoscopy    . Liver biopsy  04/10/2012    Procedure: LIVER BIOPSY;  Surgeon: Inda Castle, MD;  Location: [facility];  Service: Endoscopy;  Laterality: N/A;  ultrasound to mark order for abd limited/liver to be marked to be sent by [email or web address]     Current Outpatient Rx  Name  Route  Sig  Dispense  Refill  . acetaminophen (TYLENOL) 325 MG tablet   Oral   Take 650 mg by mouth every 4 (four) hours as needed for mild pain.         . ARIPiprazole (ABILIFY) 30 MG tablet   Oral   Take 30 mg by mouth daily.         . budesonide-formoterol (SYMBICORT) 160-4.5 MCG/ACT inhaler   Inhalation   Inhale 2 puffs into the lungs 2 (two) times daily.          . clonazePAM (KLONOPIN) 0.5 MG tablet   Oral   Take 0.5 mg by mouth 2 (two) times daily as needed for anxiety.         . dicyclomine (BENTYL) 10 MG capsule   Oral   Take 1 capsule (10 mg total) by mouth 3 (three) times daily before meals.   90 capsule   3   . docusate sodium (COLACE) 100 MG capsule   Oral   Take 100 mg by mouth 2 (two) times daily.          Marland Kitchen HYDROcodone-acetaminophen (NORCO/VICODIN) 5-325 MG tablet      Take 1 tab by mouth every 4 hours as needed for moderate pain. Take 2 tabs by mouth every 4 hours as needed for severe pain.         Marland Kitchen insulin lispro (HUMALOG) 100 UNIT/ML injection   Subcutaneous   Inject into the skin 3 (three) times daily before meals. 150-200= 0 units, 201-250= 5 units, 251-300= 8 units, 301-350= 9 units, >350 CALL MD         . lamoTRIgine (LAMICTAL) 100 MG tablet   Oral   Take 1 tablet (100 mg total) by mouth every morning.   30 tablet   0   . lamoTRIgine (LAMICTAL) 200 MG tablet   Oral   Take 1 tablet (200 mg total) by mouth at bedtime.   30 tablet   0   . metoCLOPramide (REGLAN) 10 MG tablet   Oral    Take 10 mg by mouth 4 (four) times daily as needed (chronic epigastric pain).         . mirtazapine (REMERON) 15 MG tablet   Oral  Take 15 mg by mouth at bedtime.          Marland Kitchen nystatin (MYCOSTATIN/NYSTOP) 100000 UNIT/GM POWD   Topical   Apply 1 g topically 2 (two) times daily as needed (for rash).          Marland Kitchen omeprazole (PRILOSEC) 20 MG capsule   Oral   Take 40 mg by mouth daily.          Marland Kitchen oxyCODONE (OXY IR/ROXICODONE) 5 MG immediate release tablet      Take 1 - 2 tablets by mouth every 4 hours as needed for moderate to severe pain.         . pregabalin (LYRICA) 50 MG capsule   Oral   Take 1 capsule (50 mg total) by mouth 3 (three) times daily.   90 capsule   1   . promethazine (PHENERGAN) 25 MG tablet   Oral   Take 25 mg by mouth every 8 (eight) hours as needed for nausea or vomiting.         Marland Kitchen rOPINIRole (REQUIP) 3 MG tablet   Oral   Take 3 mg by mouth at bedtime.          . simvastatin (ZOCOR) 10 MG tablet   Oral   Take 10 mg by mouth at bedtime.          . tolterodine (DETROL) 2 MG tablet   Oral   Take 2 mg by mouth daily.          Marland Kitchen zolpidem (AMBIEN) 5 MG tablet   Oral   Take 5 mg by mouth at bedtime.            Allergies Morphine and related and Doxycycline  Family History  Problem Relation Age of Onset  . Colon cancer Neg Hx   . Esophageal cancer Neg Hx   . Rectal cancer Neg Hx   . Breast cancer Mother   . Lung cancer Mother   . Stomach cancer Father   . Diabetes      4 aunts, and 1 uncle    Social History Social History  Substance Use Topics  . Smoking status: Former Smoker -- 1.50 packs/day for 50 years    Types: Cigarettes    Quit date: 04/06/2013  . Smokeless tobacco: Never Used  . Alcohol Use: No    Review of Systems Constitutional: No fever/chills. Eyes: No visual changes. ENT: No sore throat. No congestion or rhinorrhea.Positive dry mouth. Positive increased thirst. Cardiovascular: Denies chest pain. Denies  palpitations. Respiratory: Denies shortness of breath.  No cough. Gastrointestinal: No abdominal pain.  No nausea, no vomiting.  No diarrhea.  No constipation. Genitourinary: Negative for dysuria. Positive increased urination without pain. Musculoskeletal: Negative for back pain. Skin: Negative for rash. Neurological: Negative for headaches. No focal numbness, tingling or weakness.  Endocrine:Positive hyperglycemia  10-point ROS otherwise negative.  ____________________________________________   PHYSICAL EXAM:  VITAL SIGNS: ED Triage Vitals  Enc Vitals Group     BP 10/19/15 1439 139/74 mmHg     Pulse Rate 10/19/15 1439 80     Resp 10/19/15 1439 18     Temp 10/19/15 1439 98.2 F (36.8 C)     Temp Source 10/19/15 1439 Oral     SpO2 10/19/15 1439 97 %     Weight 10/19/15 1439 220 lb (99.791 kg)     Height 10/19/15 1439 5' 3"  (1.6 m)     Head Cir --      Peak Flow --  Pain Score 10/19/15 1727 8     Pain Loc --      Pain Edu? --      Excl. in [place]? --     Constitutional: Alert and oriented. Well appearing and in no acute distress. Answers questions appropriately. Eyes: Conjunctivae are normal.  EOMI. No scleral icterus. Head: Atraumatic. Nose: No congestion/rhinnorhea. Mouth/Throat: Mucous membranes are Dry.  Neck: No stridor.  Supple.  No meningismus. Cardiovascular: Normal rate, regular rhythm. No murmurs, rubs or gallops.  Respiratory: Normal respiratory effort.  No accessory muscle use or retractions. Lungs CTAB.  No wheezes, rales or ronchi. Gastrointestinal: Obese. Soft, nontender and nondistended.  No guarding or rebound.  No peritoneal signs. Musculoskeletal: No LE edema. No ttp in the calves or palpable cords.  Negative Homan's sign. Neurologic:  A&Ox3.  Speech is clear.  Face and smile are symmetric.  EOMI.  Moves all extremities well. Skin:  Skin is warm, dry and intact. No rash noted. Psychiatric: Mood and affect are normal. Speech and behavior are normal.   Normal judgement.  ____________________________________________   LABS (all labs ordered are listed, but only abnormal results are displayed)  Labs Reviewed  BASIC METABOLIC PANEL - Abnormal; Notable for the following:    Glucose, Bld 321 (*)    All other components within normal limits  CBC - Abnormal; Notable for the following:    Hemoglobin 10.1 (*)    HCT 31.6 (*)    RDW 17.4 (*)    Platelets 125 (*)    All other components within normal limits  URINALYSIS COMPLETEWITH MICROSCOPIC ([facility] ONLY) - Abnormal; Notable for the following:    Color, Urine STRAW (*)    APPearance CLEAR (*)    Glucose, UA >500 (*)    Squamous Epithelial / LPF 0-5 (*)    All other components within normal limits  CBG MONITORING, ED   ____________________________________________  EKG  Not indicated ____________________________________________  RADIOLOGY  No results found.  ____________________________________________   PROCEDURES  Procedure(s) performed: None  Critical Care performed: No ____________________________________________   INITIAL IMPRESSION / ASSESSMENT AND PLAN / ED COURSE  Pertinent labs & imaging results that were available during my care of the patient were reviewed by me and considered in my medical decision making (see chart for details).  65 y.o. female with insulin-dependent diabetes presenting for hyperglycemia and no insulin over the past few days. When I review the patient's MAR from her nursing home,She has a sliding scale insulin with Humalog, but according to the nursing notes, it is not being followed and the patient is not receiving her insulin. The most likely etiology of the patient's hyperglycemia today is her diabetes in the absence of taking insulin. She does not have any clinical symptoms that would be consistent with acute infection, MI or other insult driving her blood sugar. However, I'll check her electrolytes and her urinalysis. In the meantime, I'll  try to contact the physician who covers the [facility] to get a better plan in place for her insulin.  ----------------------------------------- 5:46 PM on 10/19/2015 -----------------------------------------  The patient is hyperglycemia without DKA. Her urine does not show infection, does show glucose but has no ketones in it. I'm awaiting a call back from the physician on-call for the [facility].  ----------------------------------------- 6:28 PM on 10/19/2015 -----------------------------------------  I was able to talk to the patient's primary care physician, who has been her primary care doctor at the [facility] for the past month. He was able to clarify with me  that she had previously been on anti-hyperglycemics, but was taken off of those due to multiple hypoglycemic episodes. At this time, the patient is on a sliding scale insulin, but I did note to the physician who I spoke with that it did not appear that she was getting her insulin according to the scale. The plan will be to have the patient keep her outpatient follow-up appointment on Thursday, and in the meantime, the physician will talk to the nursing staff to clarify her sliding scale regimen. This time, the patient is stable for discharge. Return precautions as well as follow-up instructions were discussed.  ____________________________________________  FINAL CLINICAL IMPRESSION(S) / ED DIAGNOSES  Final diagnoses:  Hyperglycemia  Dry mouth  Polyuria      NEW MEDICATIONS STARTED DURING THIS VISIT:  New Prescriptions   No medications on file     [name], MD 10/19/15 1829

## 2015-10-19 NOTE — ED Notes (Signed)
D/c inst tp pt  Iv dc'ed

## 2015-10-19 NOTE — ED Notes (Signed)
Pt from the [place] via ems with reports that pts blood sugar at 1130 was 477 which orders for Nurse to call primary MD to obtain additional orders for insulin and MD could not be contacted x 2 so they called EMS to transport patient. When ems arrived pts cbg was 351. Pt reports excessive thirst and urination. Denies pain.

## 2015-10-19 NOTE — Discharge Instructions (Signed)
Please follow Ms. Fomby' sliding scale insulin regimen to prevent high blood sugar.    Return to the emergency department for low blood sugars, changes in mental status, fever, vomiting or inability to keep down fluids, or any other symptoms concerning to you.

## 2015-10-19 NOTE — ED Notes (Signed)
Pt reports she brought in via ems from the [facility] with elevated blood sugar today.  No n/v/d.  No abd pain.  Pt states she is thirsty.  No dizziness.  Pt states she is there for rehab of right arm fx.  No sling or cast on right arm.  Pt alert.  Speech clear.

## 2015-10-19 NOTE — ED Notes (Signed)
fsbs 103

## 2015-10-19 NOTE — ED Notes (Signed)
MD at bedside. 

## 2015-10-20 DIAGNOSIS — I10 Essential (primary) hypertension: Secondary | ICD-10-CM | POA: Diagnosis not present

## 2015-10-20 DIAGNOSIS — E118 Type 2 diabetes mellitus with unspecified complications: Secondary | ICD-10-CM | POA: Diagnosis not present

## 2015-10-20 DIAGNOSIS — Z8719 Personal history of other diseases of the digestive system: Secondary | ICD-10-CM | POA: Diagnosis not present

## 2015-10-20 DIAGNOSIS — E785 Hyperlipidemia, unspecified: Secondary | ICD-10-CM | POA: Diagnosis not present

## 2015-10-20 DIAGNOSIS — G2581 Restless legs syndrome: Secondary | ICD-10-CM | POA: Diagnosis not present

## 2015-10-20 DIAGNOSIS — K7460 Unspecified cirrhosis of liver: Secondary | ICD-10-CM | POA: Diagnosis not present

## 2015-10-20 DIAGNOSIS — Z1211 Encounter for screening for malignant neoplasm of colon: Secondary | ICD-10-CM | POA: Diagnosis not present

## 2015-10-20 DIAGNOSIS — E1140 Type 2 diabetes mellitus with diabetic neuropathy, unspecified: Secondary | ICD-10-CM | POA: Diagnosis not present

## 2015-10-20 DIAGNOSIS — K219 Gastro-esophageal reflux disease without esophagitis: Secondary | ICD-10-CM | POA: Diagnosis not present

## 2015-10-20 DIAGNOSIS — R739 Hyperglycemia, unspecified: Secondary | ICD-10-CM | POA: Diagnosis not present

## 2015-10-20 DIAGNOSIS — J449 Chronic obstructive pulmonary disease, unspecified: Secondary | ICD-10-CM | POA: Diagnosis not present

## 2015-10-20 DIAGNOSIS — G4733 Obstructive sleep apnea (adult) (pediatric): Secondary | ICD-10-CM | POA: Diagnosis not present

## 2015-10-20 DIAGNOSIS — F419 Anxiety disorder, unspecified: Secondary | ICD-10-CM | POA: Diagnosis not present

## 2015-10-20 DIAGNOSIS — S42201D Unspecified fracture of upper end of right humerus, subsequent encounter for fracture with routine healing: Secondary | ICD-10-CM | POA: Diagnosis not present

## 2015-10-21 ENCOUNTER — Ambulatory Visit: Payer: Medicare Other | Admitting: Family Medicine

## 2015-10-22 ENCOUNTER — Ambulatory Visit: Payer: Medicare Other | Admitting: Family Medicine

## 2015-10-22 DIAGNOSIS — K7469 Other cirrhosis of liver: Secondary | ICD-10-CM | POA: Diagnosis not present

## 2015-10-22 DIAGNOSIS — G2581 Restless legs syndrome: Secondary | ICD-10-CM | POA: Diagnosis not present

## 2015-10-22 DIAGNOSIS — J449 Chronic obstructive pulmonary disease, unspecified: Secondary | ICD-10-CM | POA: Diagnosis not present

## 2015-10-22 DIAGNOSIS — Z8719 Personal history of other diseases of the digestive system: Secondary | ICD-10-CM | POA: Diagnosis not present

## 2015-10-22 DIAGNOSIS — S42201D Unspecified fracture of upper end of right humerus, subsequent encounter for fracture with routine healing: Secondary | ICD-10-CM | POA: Diagnosis not present

## 2015-10-22 DIAGNOSIS — K219 Gastro-esophageal reflux disease without esophagitis: Secondary | ICD-10-CM | POA: Diagnosis not present

## 2015-10-22 DIAGNOSIS — R110 Nausea: Secondary | ICD-10-CM | POA: Diagnosis not present

## 2015-10-22 DIAGNOSIS — E119 Type 2 diabetes mellitus without complications: Secondary | ICD-10-CM | POA: Diagnosis not present

## 2015-10-22 DIAGNOSIS — E785 Hyperlipidemia, unspecified: Secondary | ICD-10-CM | POA: Diagnosis not present

## 2015-10-22 DIAGNOSIS — R269 Unspecified abnormalities of gait and mobility: Secondary | ICD-10-CM | POA: Diagnosis not present

## 2015-10-22 DIAGNOSIS — K7460 Unspecified cirrhosis of liver: Secondary | ICD-10-CM | POA: Diagnosis not present

## 2015-10-22 DIAGNOSIS — F5109 Other insomnia not due to a substance or known physiological condition: Secondary | ICD-10-CM | POA: Diagnosis not present

## 2015-10-22 DIAGNOSIS — G4733 Obstructive sleep apnea (adult) (pediatric): Secondary | ICD-10-CM | POA: Diagnosis not present

## 2015-10-22 DIAGNOSIS — I10 Essential (primary) hypertension: Secondary | ICD-10-CM | POA: Diagnosis not present

## 2015-10-22 DIAGNOSIS — E1140 Type 2 diabetes mellitus with diabetic neuropathy, unspecified: Secondary | ICD-10-CM | POA: Diagnosis not present

## 2015-10-22 DIAGNOSIS — K7581 Nonalcoholic steatohepatitis (NASH): Secondary | ICD-10-CM | POA: Diagnosis not present

## 2015-10-22 DIAGNOSIS — M539 Dorsopathy, unspecified: Secondary | ICD-10-CM | POA: Diagnosis not present

## 2015-10-22 DIAGNOSIS — R5381 Other malaise: Secondary | ICD-10-CM | POA: Diagnosis not present

## 2015-10-22 DIAGNOSIS — F419 Anxiety disorder, unspecified: Secondary | ICD-10-CM | POA: Diagnosis not present

## 2015-10-23 DIAGNOSIS — K7460 Unspecified cirrhosis of liver: Secondary | ICD-10-CM | POA: Diagnosis not present

## 2015-10-23 DIAGNOSIS — G2581 Restless legs syndrome: Secondary | ICD-10-CM | POA: Diagnosis not present

## 2015-10-23 DIAGNOSIS — I10 Essential (primary) hypertension: Secondary | ICD-10-CM | POA: Diagnosis not present

## 2015-10-23 DIAGNOSIS — E785 Hyperlipidemia, unspecified: Secondary | ICD-10-CM | POA: Diagnosis not present

## 2015-10-23 DIAGNOSIS — F419 Anxiety disorder, unspecified: Secondary | ICD-10-CM | POA: Diagnosis not present

## 2015-10-23 DIAGNOSIS — K219 Gastro-esophageal reflux disease without esophagitis: Secondary | ICD-10-CM | POA: Diagnosis not present

## 2015-10-23 DIAGNOSIS — S42201D Unspecified fracture of upper end of right humerus, subsequent encounter for fracture with routine healing: Secondary | ICD-10-CM | POA: Diagnosis not present

## 2015-10-23 DIAGNOSIS — E1140 Type 2 diabetes mellitus with diabetic neuropathy, unspecified: Secondary | ICD-10-CM | POA: Diagnosis not present

## 2015-10-23 DIAGNOSIS — G4733 Obstructive sleep apnea (adult) (pediatric): Secondary | ICD-10-CM | POA: Diagnosis not present

## 2015-10-23 DIAGNOSIS — J449 Chronic obstructive pulmonary disease, unspecified: Secondary | ICD-10-CM | POA: Diagnosis not present

## 2015-10-23 DIAGNOSIS — Z8719 Personal history of other diseases of the digestive system: Secondary | ICD-10-CM | POA: Diagnosis not present

## 2015-10-26 DIAGNOSIS — E1140 Type 2 diabetes mellitus with diabetic neuropathy, unspecified: Secondary | ICD-10-CM | POA: Diagnosis not present

## 2015-10-26 DIAGNOSIS — Z8719 Personal history of other diseases of the digestive system: Secondary | ICD-10-CM | POA: Diagnosis not present

## 2015-10-26 DIAGNOSIS — K7460 Unspecified cirrhosis of liver: Secondary | ICD-10-CM | POA: Diagnosis not present

## 2015-10-26 DIAGNOSIS — E118 Type 2 diabetes mellitus with unspecified complications: Secondary | ICD-10-CM | POA: Diagnosis not present

## 2015-10-26 DIAGNOSIS — Z79899 Other long term (current) drug therapy: Secondary | ICD-10-CM | POA: Diagnosis not present

## 2015-10-26 DIAGNOSIS — S42201D Unspecified fracture of upper end of right humerus, subsequent encounter for fracture with routine healing: Secondary | ICD-10-CM | POA: Diagnosis not present

## 2015-10-26 DIAGNOSIS — E785 Hyperlipidemia, unspecified: Secondary | ICD-10-CM | POA: Diagnosis not present

## 2015-10-26 DIAGNOSIS — I10 Essential (primary) hypertension: Secondary | ICD-10-CM | POA: Diagnosis not present

## 2015-10-26 DIAGNOSIS — G2581 Restless legs syndrome: Secondary | ICD-10-CM | POA: Diagnosis not present

## 2015-10-26 DIAGNOSIS — K219 Gastro-esophageal reflux disease without esophagitis: Secondary | ICD-10-CM | POA: Diagnosis not present

## 2015-10-26 DIAGNOSIS — J449 Chronic obstructive pulmonary disease, unspecified: Secondary | ICD-10-CM | POA: Diagnosis not present

## 2015-10-26 DIAGNOSIS — F419 Anxiety disorder, unspecified: Secondary | ICD-10-CM | POA: Diagnosis not present

## 2015-10-26 DIAGNOSIS — G4733 Obstructive sleep apnea (adult) (pediatric): Secondary | ICD-10-CM | POA: Diagnosis not present

## 2015-10-27 DIAGNOSIS — E785 Hyperlipidemia, unspecified: Secondary | ICD-10-CM | POA: Diagnosis not present

## 2015-10-27 DIAGNOSIS — G2581 Restless legs syndrome: Secondary | ICD-10-CM | POA: Diagnosis not present

## 2015-10-27 DIAGNOSIS — G4733 Obstructive sleep apnea (adult) (pediatric): Secondary | ICD-10-CM | POA: Diagnosis not present

## 2015-10-27 DIAGNOSIS — E1140 Type 2 diabetes mellitus with diabetic neuropathy, unspecified: Secondary | ICD-10-CM | POA: Diagnosis not present

## 2015-10-27 DIAGNOSIS — R110 Nausea: Secondary | ICD-10-CM | POA: Diagnosis not present

## 2015-10-27 DIAGNOSIS — K7469 Other cirrhosis of liver: Secondary | ICD-10-CM | POA: Diagnosis not present

## 2015-10-27 DIAGNOSIS — Z8719 Personal history of other diseases of the digestive system: Secondary | ICD-10-CM | POA: Diagnosis not present

## 2015-10-27 DIAGNOSIS — K7581 Nonalcoholic steatohepatitis (NASH): Secondary | ICD-10-CM | POA: Diagnosis not present

## 2015-10-27 DIAGNOSIS — K219 Gastro-esophageal reflux disease without esophagitis: Secondary | ICD-10-CM | POA: Diagnosis not present

## 2015-10-27 DIAGNOSIS — M539 Dorsopathy, unspecified: Secondary | ICD-10-CM | POA: Diagnosis not present

## 2015-10-27 DIAGNOSIS — F419 Anxiety disorder, unspecified: Secondary | ICD-10-CM | POA: Diagnosis not present

## 2015-10-27 DIAGNOSIS — S42201D Unspecified fracture of upper end of right humerus, subsequent encounter for fracture with routine healing: Secondary | ICD-10-CM | POA: Diagnosis not present

## 2015-10-27 DIAGNOSIS — I10 Essential (primary) hypertension: Secondary | ICD-10-CM | POA: Diagnosis not present

## 2015-10-27 DIAGNOSIS — K7460 Unspecified cirrhosis of liver: Secondary | ICD-10-CM | POA: Diagnosis not present

## 2015-10-27 DIAGNOSIS — J449 Chronic obstructive pulmonary disease, unspecified: Secondary | ICD-10-CM | POA: Diagnosis not present

## 2015-10-29 DIAGNOSIS — G2581 Restless legs syndrome: Secondary | ICD-10-CM | POA: Diagnosis not present

## 2015-10-29 DIAGNOSIS — M2550 Pain in unspecified joint: Secondary | ICD-10-CM | POA: Diagnosis not present

## 2015-10-29 DIAGNOSIS — E119 Type 2 diabetes mellitus without complications: Secondary | ICD-10-CM | POA: Diagnosis not present

## 2015-10-29 DIAGNOSIS — R5381 Other malaise: Secondary | ICD-10-CM | POA: Diagnosis not present

## 2015-10-30 DIAGNOSIS — J449 Chronic obstructive pulmonary disease, unspecified: Secondary | ICD-10-CM | POA: Diagnosis not present

## 2015-10-30 DIAGNOSIS — E785 Hyperlipidemia, unspecified: Secondary | ICD-10-CM | POA: Diagnosis not present

## 2015-10-30 DIAGNOSIS — F419 Anxiety disorder, unspecified: Secondary | ICD-10-CM | POA: Diagnosis not present

## 2015-10-30 DIAGNOSIS — G4733 Obstructive sleep apnea (adult) (pediatric): Secondary | ICD-10-CM | POA: Diagnosis not present

## 2015-10-30 DIAGNOSIS — K219 Gastro-esophageal reflux disease without esophagitis: Secondary | ICD-10-CM | POA: Diagnosis not present

## 2015-10-30 DIAGNOSIS — E1140 Type 2 diabetes mellitus with diabetic neuropathy, unspecified: Secondary | ICD-10-CM | POA: Diagnosis not present

## 2015-10-30 DIAGNOSIS — Z8719 Personal history of other diseases of the digestive system: Secondary | ICD-10-CM | POA: Diagnosis not present

## 2015-10-30 DIAGNOSIS — G2581 Restless legs syndrome: Secondary | ICD-10-CM | POA: Diagnosis not present

## 2015-10-30 DIAGNOSIS — S42201D Unspecified fracture of upper end of right humerus, subsequent encounter for fracture with routine healing: Secondary | ICD-10-CM | POA: Diagnosis not present

## 2015-10-30 DIAGNOSIS — I10 Essential (primary) hypertension: Secondary | ICD-10-CM | POA: Diagnosis not present

## 2015-10-30 DIAGNOSIS — K7460 Unspecified cirrhosis of liver: Secondary | ICD-10-CM | POA: Diagnosis not present

## 2015-11-02 DIAGNOSIS — S42201D Unspecified fracture of upper end of right humerus, subsequent encounter for fracture with routine healing: Secondary | ICD-10-CM | POA: Diagnosis not present

## 2015-11-02 DIAGNOSIS — G4733 Obstructive sleep apnea (adult) (pediatric): Secondary | ICD-10-CM | POA: Diagnosis not present

## 2015-11-02 DIAGNOSIS — F419 Anxiety disorder, unspecified: Secondary | ICD-10-CM | POA: Diagnosis not present

## 2015-11-02 DIAGNOSIS — I10 Essential (primary) hypertension: Secondary | ICD-10-CM | POA: Diagnosis not present

## 2015-11-02 DIAGNOSIS — K7460 Unspecified cirrhosis of liver: Secondary | ICD-10-CM | POA: Diagnosis not present

## 2015-11-02 DIAGNOSIS — Z8719 Personal history of other diseases of the digestive system: Secondary | ICD-10-CM | POA: Diagnosis not present

## 2015-11-02 DIAGNOSIS — K219 Gastro-esophageal reflux disease without esophagitis: Secondary | ICD-10-CM | POA: Diagnosis not present

## 2015-11-02 DIAGNOSIS — J449 Chronic obstructive pulmonary disease, unspecified: Secondary | ICD-10-CM | POA: Diagnosis not present

## 2015-11-02 DIAGNOSIS — E1140 Type 2 diabetes mellitus with diabetic neuropathy, unspecified: Secondary | ICD-10-CM | POA: Diagnosis not present

## 2015-11-02 DIAGNOSIS — G2581 Restless legs syndrome: Secondary | ICD-10-CM | POA: Diagnosis not present

## 2015-11-02 DIAGNOSIS — E785 Hyperlipidemia, unspecified: Secondary | ICD-10-CM | POA: Diagnosis not present

## 2015-11-05 DIAGNOSIS — J449 Chronic obstructive pulmonary disease, unspecified: Secondary | ICD-10-CM | POA: Diagnosis not present

## 2015-11-05 DIAGNOSIS — R5381 Other malaise: Secondary | ICD-10-CM | POA: Diagnosis not present

## 2015-11-05 DIAGNOSIS — R269 Unspecified abnormalities of gait and mobility: Secondary | ICD-10-CM | POA: Diagnosis not present

## 2015-11-05 DIAGNOSIS — M25559 Pain in unspecified hip: Secondary | ICD-10-CM | POA: Diagnosis not present

## 2015-11-05 DIAGNOSIS — G4733 Obstructive sleep apnea (adult) (pediatric): Secondary | ICD-10-CM | POA: Diagnosis not present

## 2015-11-05 DIAGNOSIS — K219 Gastro-esophageal reflux disease without esophagitis: Secondary | ICD-10-CM | POA: Diagnosis not present

## 2015-11-05 DIAGNOSIS — S42201D Unspecified fracture of upper end of right humerus, subsequent encounter for fracture with routine healing: Secondary | ICD-10-CM | POA: Diagnosis not present

## 2015-11-05 DIAGNOSIS — F419 Anxiety disorder, unspecified: Secondary | ICD-10-CM | POA: Diagnosis not present

## 2015-11-05 DIAGNOSIS — E1140 Type 2 diabetes mellitus with diabetic neuropathy, unspecified: Secondary | ICD-10-CM | POA: Diagnosis not present

## 2015-11-05 DIAGNOSIS — Z8719 Personal history of other diseases of the digestive system: Secondary | ICD-10-CM | POA: Diagnosis not present

## 2015-11-05 DIAGNOSIS — K7460 Unspecified cirrhosis of liver: Secondary | ICD-10-CM | POA: Diagnosis not present

## 2015-11-05 DIAGNOSIS — E785 Hyperlipidemia, unspecified: Secondary | ICD-10-CM | POA: Diagnosis not present

## 2015-11-05 DIAGNOSIS — R609 Edema, unspecified: Secondary | ICD-10-CM | POA: Diagnosis not present

## 2015-11-05 DIAGNOSIS — G2581 Restless legs syndrome: Secondary | ICD-10-CM | POA: Diagnosis not present

## 2015-11-05 DIAGNOSIS — I10 Essential (primary) hypertension: Secondary | ICD-10-CM | POA: Diagnosis not present

## 2015-11-09 ENCOUNTER — Ambulatory Visit: Payer: Commercial Managed Care - HMO | Admitting: Hematology and Oncology

## 2015-11-09 ENCOUNTER — Other Ambulatory Visit: Payer: Commercial Managed Care - HMO

## 2015-11-09 IMAGING — CT CT CERVICAL SPINE W/O CM
2 of 3 series · 8 of 14 positions shown, 10 images · non-contrast
Comparison: Neck CT - 07/05/2013

CLINICAL DATA: Post fall hitting back of head now with head and
neck pain.

EXAM:
CT HEAD WITHOUT CONTRAST
CT CERVICAL SPINE WITHOUT CONTRAST
TECHNIQUE: Multidetector CT imaging of the head and cervical spine was
performed following the standard protocol without intravenous
contrast. Multiplanar CT image reconstructions of the cervical spine
were also generated.

[Series 5: c spine soft · axial · 0.34mm/px · z∈[-318,-250]mm · 3 of 70 slices shown]
[im 18/70  soft-tissue]
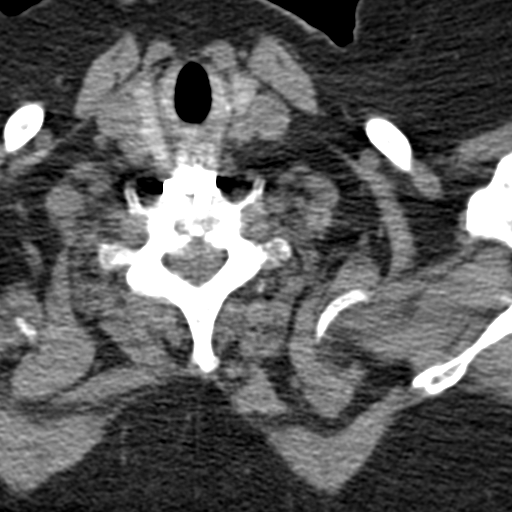
[im 35/70  soft-tissue]
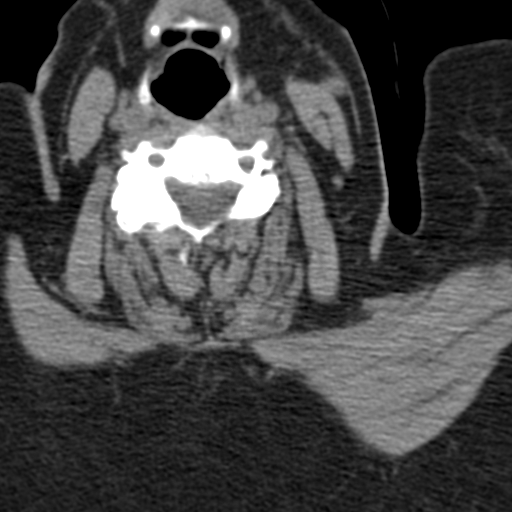
[im 52/70  soft-tissue]
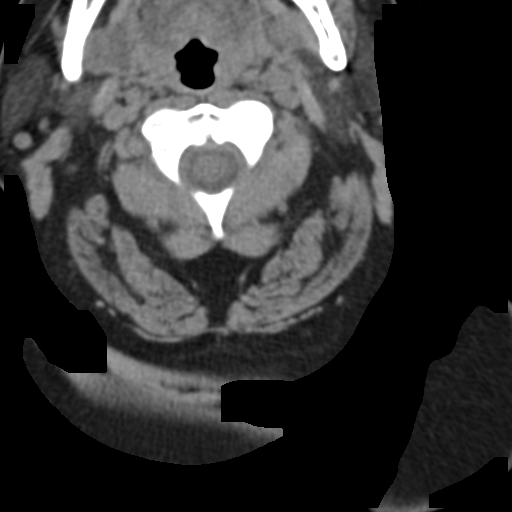

[Series 8: orthogonal axials · axial · 0.29mm/px · z∈[-368,-258]mm · 5 of 97 slices shown, 7 images]
[im 17/97  soft-tissue]
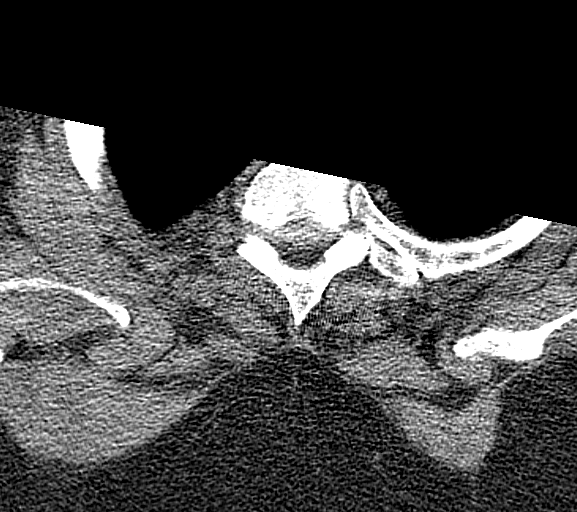
[im 17/97  bone]
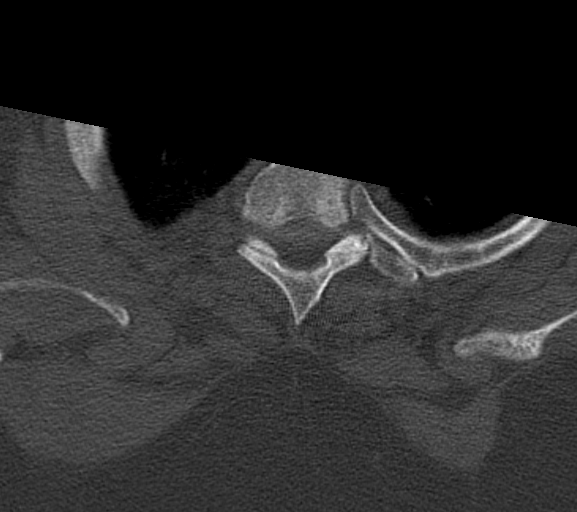
[im 33/97  bone]
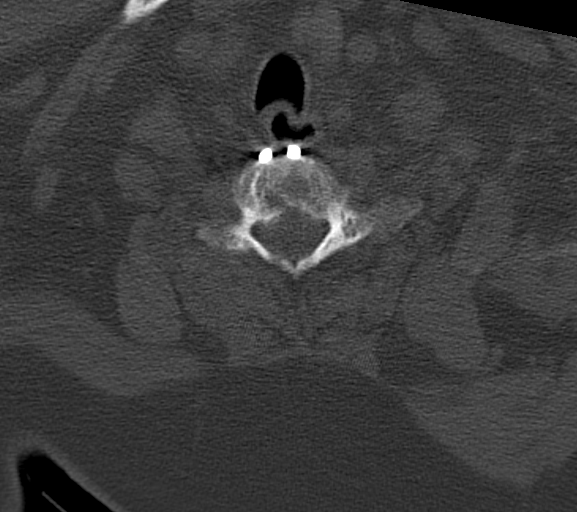
[im 49/97  bone]
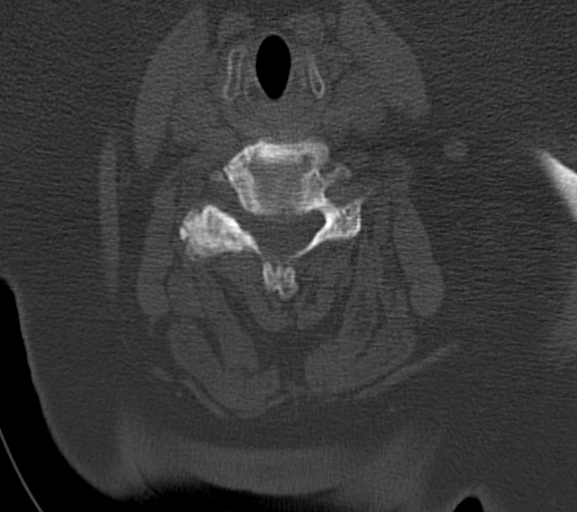
[im 65/97  bone]
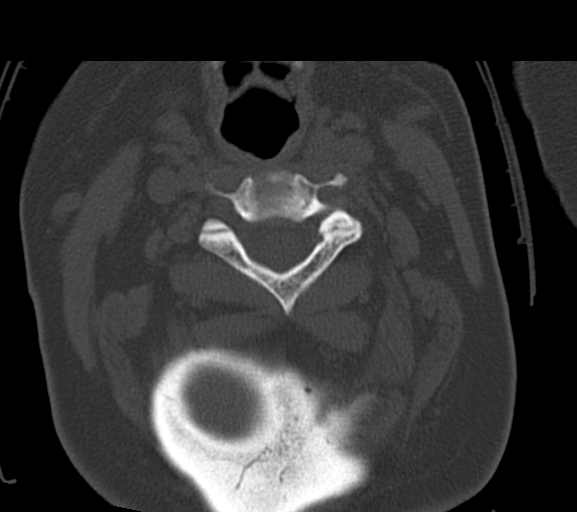
[im 81/97  soft-tissue]
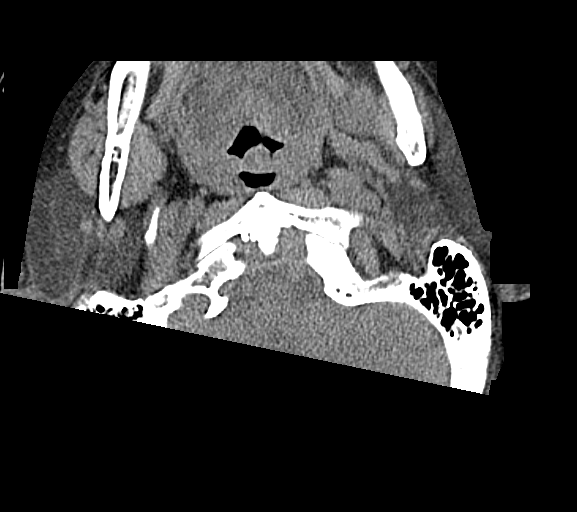
[im 81/97  bone]
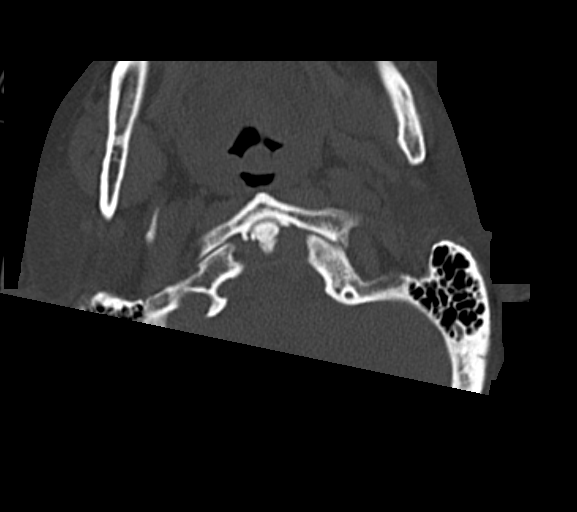

[8 of 14 positions shown; findings below may reference images not displayed]

FINDINGS: CT HEAD FINDINGS

Regional soft tissues appear normal. No radiopaque foreign body. No
displaced calvarial fracture.

Gray-white differentiation is maintained. No CT evidence acute large
territory infarct. No intraparenchymal or extra-axial mass or
hemorrhage. Normal size and configuration of the ventricles and
basilar cisterns. No midline shift. Intracranial atherosclerosis.
There is scattered mucosal thickening involving the bilateral
anterior and posterior ethmoidal air cells. Remaining paranasal
sinuses mastoid air cells remain normally aerated. Post bilateral
cataract surgery.

CT CERVICAL SPINE FINDINGS

C1 to the superior endplate of T1 is imaged though evaluation of the
mid in caudal aspects of the cervical spine is degraded secondary to
beam hardening artifact due to patient body habitus.

Normal alignment of the cervical spine. No anterolisthesis or
retrolisthesis. The bilateral facets are normally aligned. The dens
is normally positioned between the lateral masses of C1. Normal
atlantodental and atlantoaxial articulations.

Post C5-C7 ACDF and interbody fusion without evidence of hardware
failure or loosening.

No fracture or static subluxation of the cervical spine. Cervical
vertebral body heights are preserved. Prevertebral soft tissues are
normal.

Remaining intervertebral disc space heights appear preserved.

Regional soft tissues appear normal. Normal noncontrast appearance
of the thyroid gland.
IMPRESSION: 1. Negative noncontrast head CT.
2. No fracture or static subluxation of the cervical spine.
3. Post C5-C7 ACDF and interbody fusion without evidence of hardware
failure or loosening.

## 2015-11-09 IMAGING — CR DG LUMBAR SPINE 2-3V
1 series · 3 of 3 positions shown · non-contrast
Comparison: Multiple priors, most recently lumbar spine radiograph
02/24/2015.

CLINICAL DATA: 64-year-old female with history of trauma from a
fall today onto cement. Chronic back pain, acutely worsened after
the fall.

EXAM:
LUMBAR SPINE - 2-3 VIEW

[Series 1: dg lumbar spine 2-3 views · 0.14mm/px · 3 of 3 slices shown]
[im 1/3]
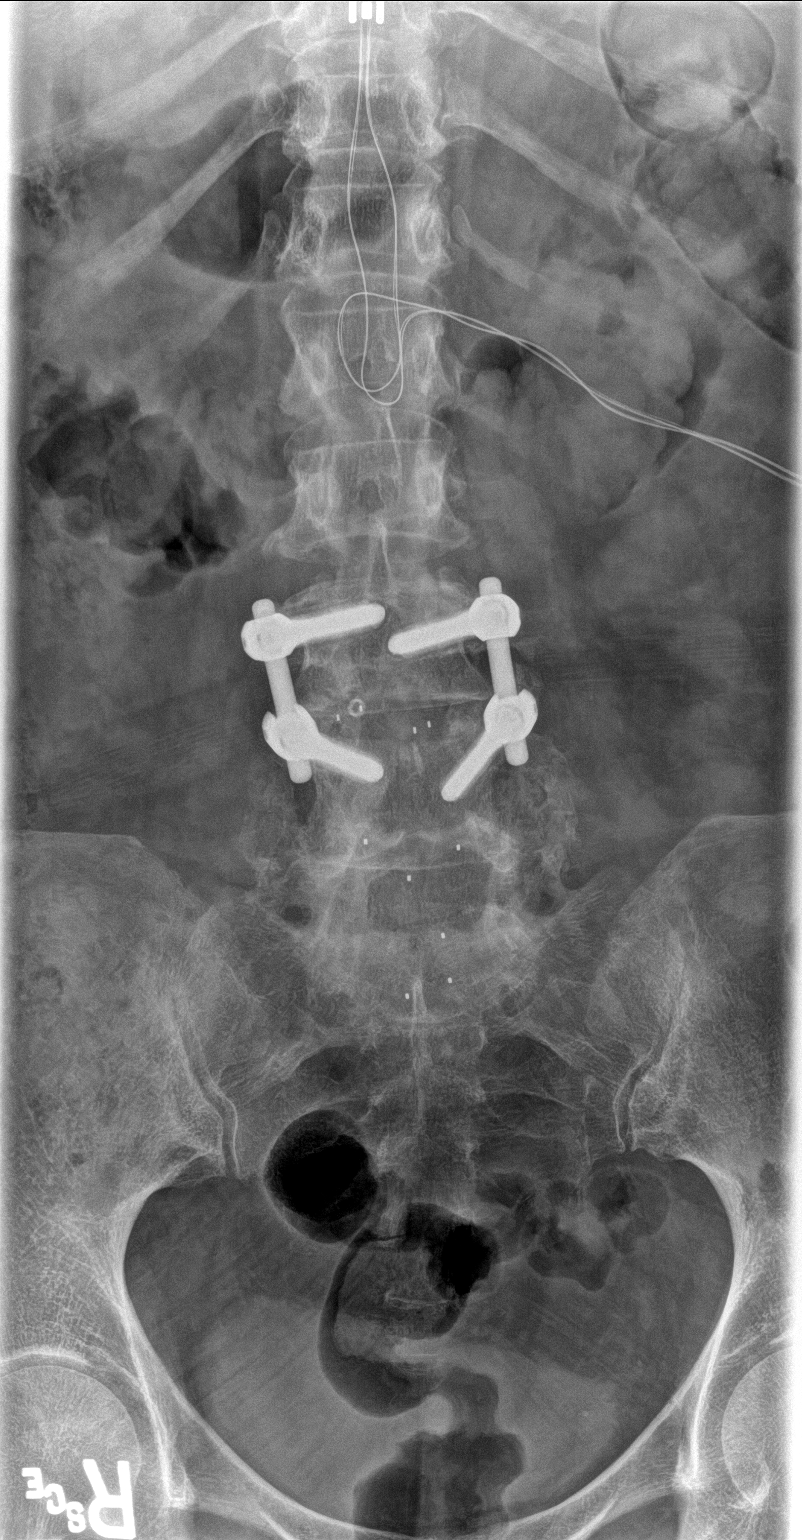
[im 2/3]
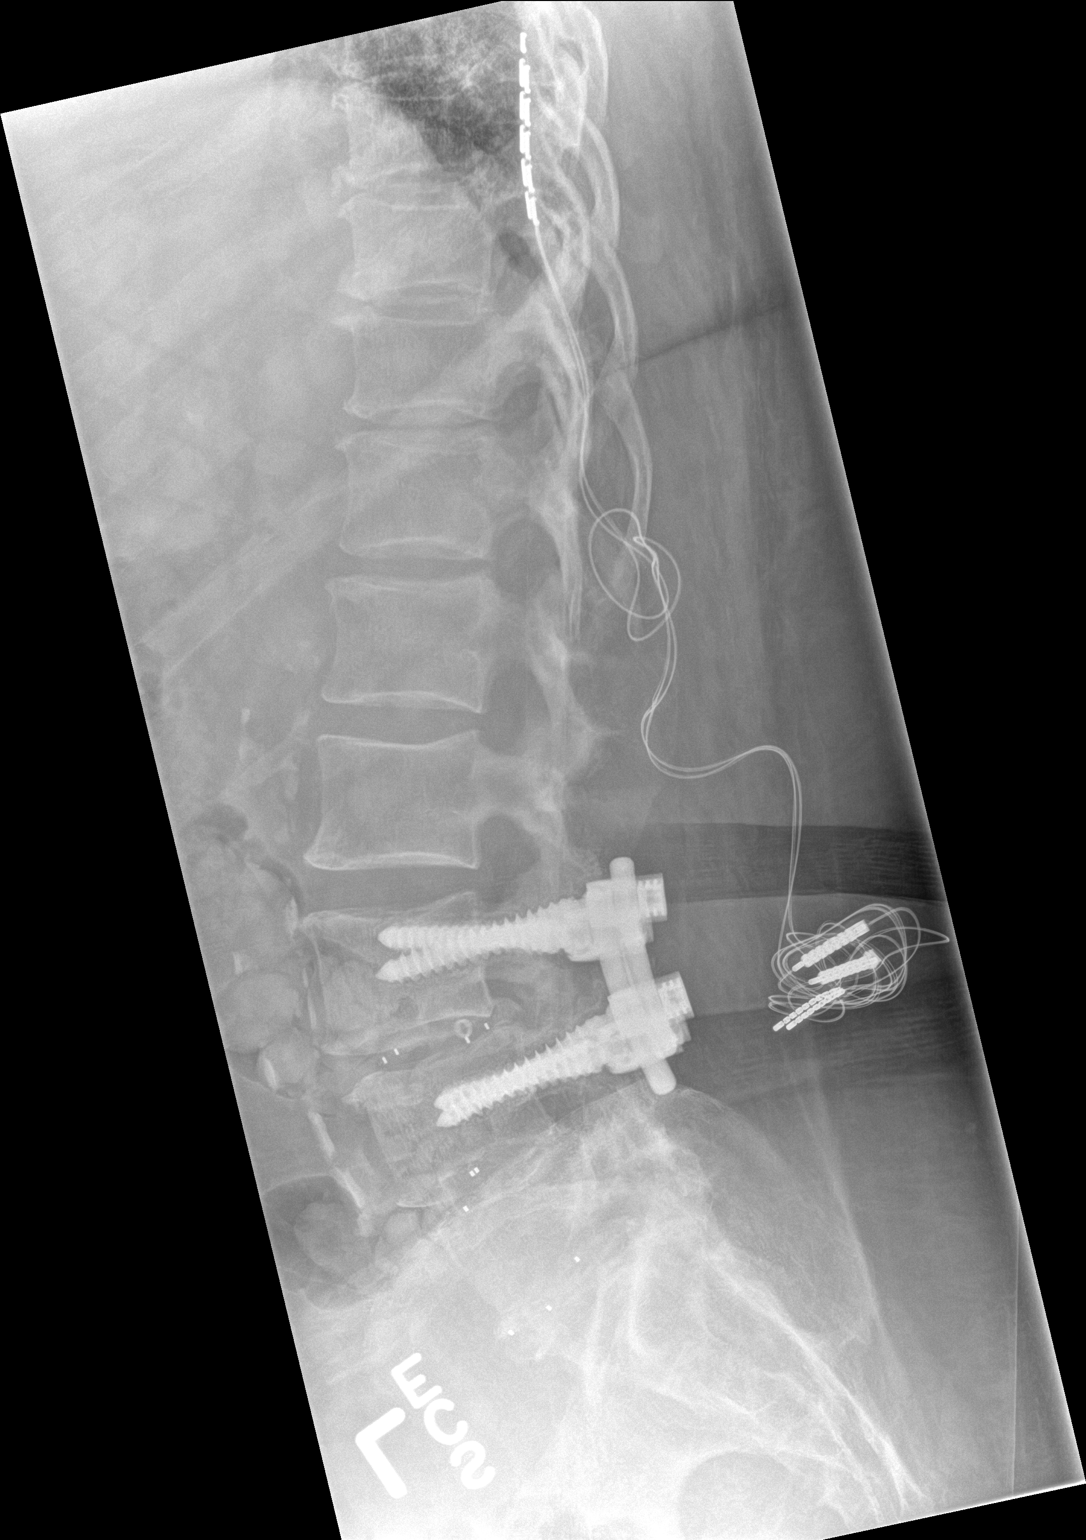
[im 3/3]
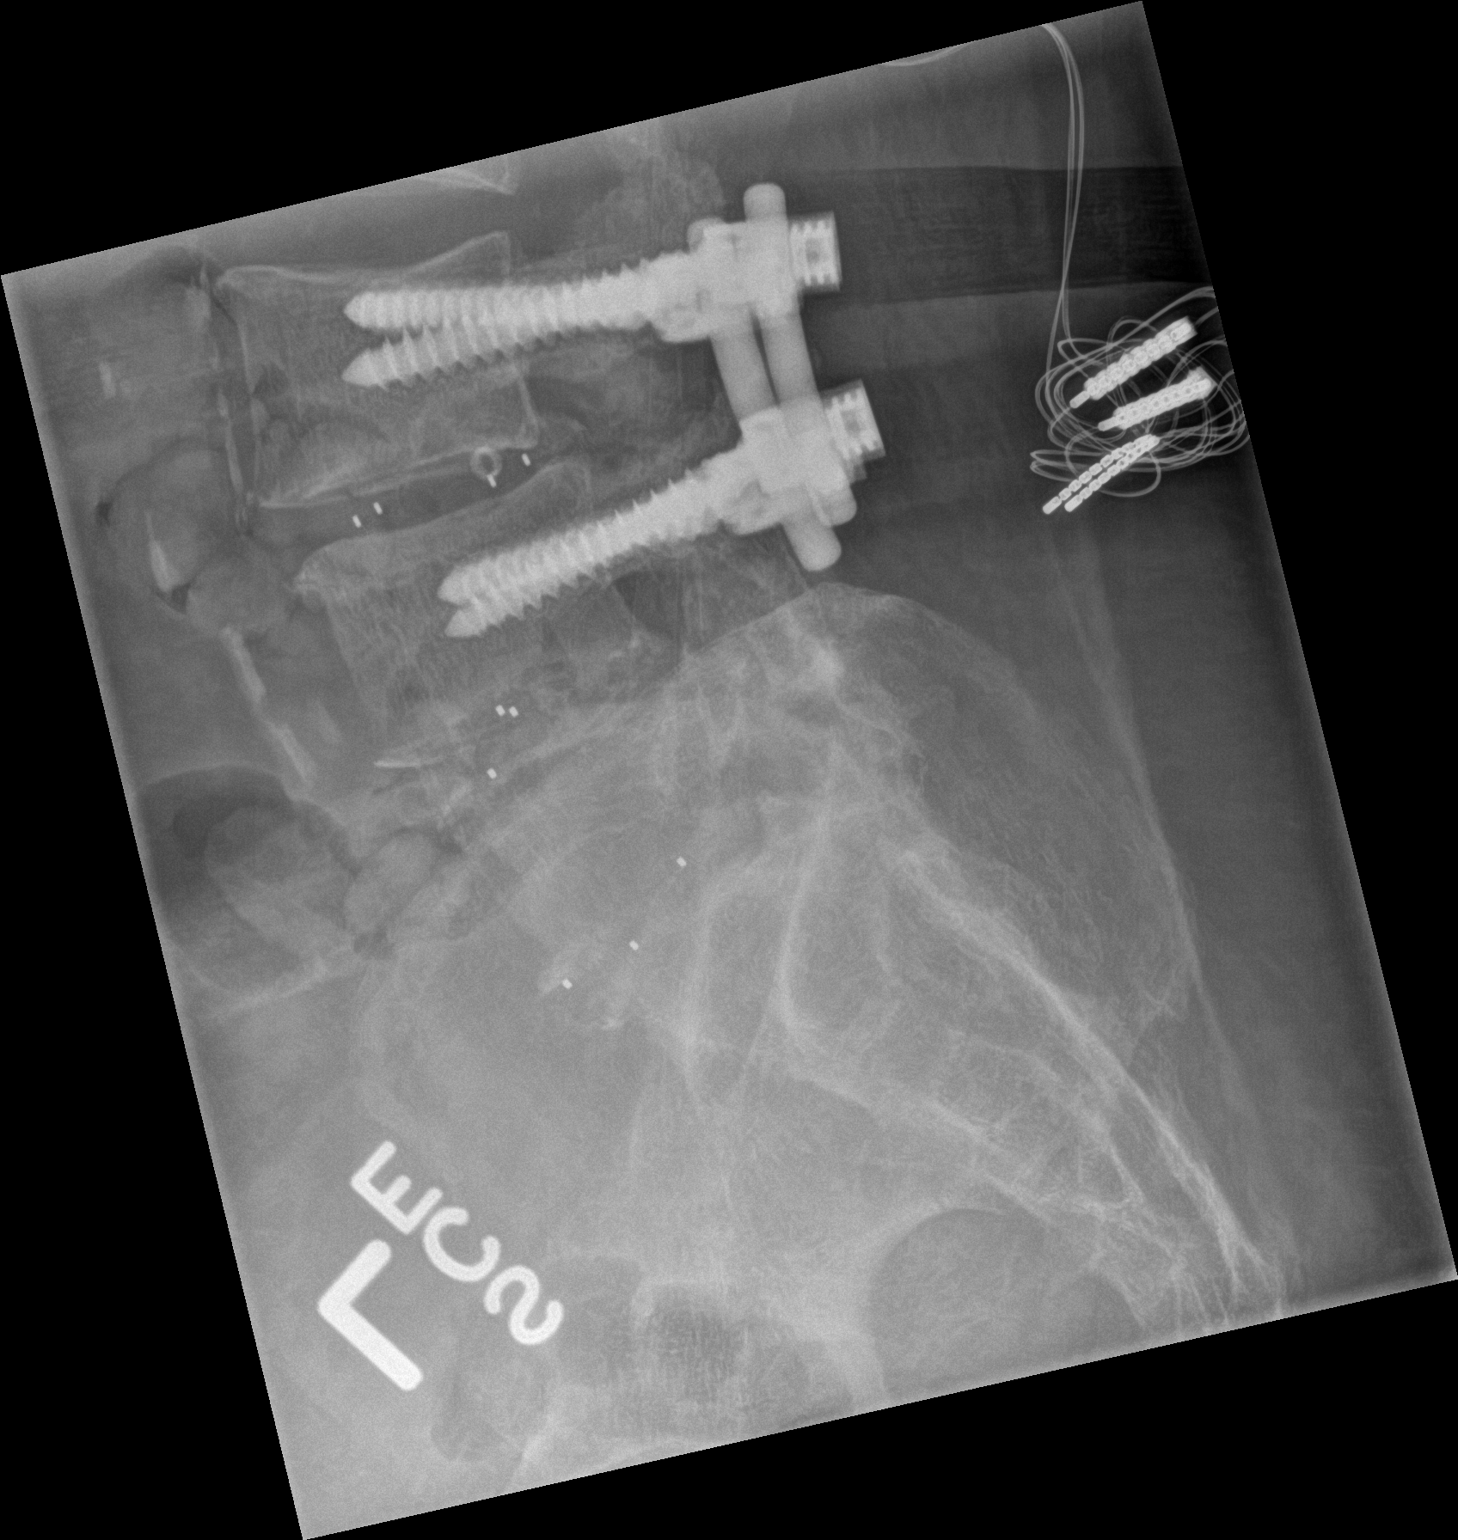

[3 of 3 positions shown; findings below may reference images not displayed]

FINDINGS: Three views of the lumbar spine again demonstrate postoperative
changes of PLIF at L3-L4. Interbody grafts are noted at L3-L4, L4-L5
and L5-S1. There is 5 mm of anterolisthesis of L3 upon L4
(unchanged). Alignment is otherwise anatomic. Lucency adjacent to
the screws at both levels is noted, which may suggest some mild
hardware loosening. No fracture of the hardware identified. No acute
displaced fracture or compression type fracture in the lumbar spine.
Multilevel degenerative disc disease and mild multilevel facet
arthropathy again noted.
IMPRESSION: 1. No acute radiographic abnormality of the lumbar spine.
2. Postoperative changes redemonstrated, as above, with stable
postoperative appearance of the spine. There does appear to be some
mild lucency adjacent to the spinal hardware, which could suggest a
small amount of hardware loosening.

## 2015-11-12 ENCOUNTER — Other Ambulatory Visit: Payer: Self-pay

## 2015-11-12 DIAGNOSIS — M2550 Pain in unspecified joint: Secondary | ICD-10-CM | POA: Diagnosis not present

## 2015-11-12 DIAGNOSIS — F5109 Other insomnia not due to a substance or known physiological condition: Secondary | ICD-10-CM | POA: Diagnosis not present

## 2015-11-12 DIAGNOSIS — R269 Unspecified abnormalities of gait and mobility: Secondary | ICD-10-CM | POA: Diagnosis not present

## 2015-11-12 DIAGNOSIS — E119 Type 2 diabetes mellitus without complications: Secondary | ICD-10-CM | POA: Diagnosis not present

## 2015-11-12 DIAGNOSIS — R5381 Other malaise: Secondary | ICD-10-CM | POA: Diagnosis not present

## 2015-11-12 DIAGNOSIS — D696 Thrombocytopenia, unspecified: Secondary | ICD-10-CM

## 2015-11-13 ENCOUNTER — Inpatient Hospital Stay: Payer: Medicare Other | Attending: Hematology and Oncology

## 2015-11-13 ENCOUNTER — Inpatient Hospital Stay (HOSPITAL_BASED_OUTPATIENT_CLINIC_OR_DEPARTMENT_OTHER): Payer: Medicare Other | Admitting: Hematology and Oncology

## 2015-11-13 ENCOUNTER — Encounter: Payer: Self-pay | Admitting: Hematology and Oncology

## 2015-11-13 ENCOUNTER — Telehealth: Payer: Self-pay

## 2015-11-13 VITALS — BP 127/62 | HR 82 | Temp 95.7°F | Resp 18 | Ht 63.0 in | Wt 227.7 lb

## 2015-11-13 DIAGNOSIS — G4700 Insomnia, unspecified: Secondary | ICD-10-CM | POA: Diagnosis not present

## 2015-11-13 DIAGNOSIS — E785 Hyperlipidemia, unspecified: Secondary | ICD-10-CM

## 2015-11-13 DIAGNOSIS — F319 Bipolar disorder, unspecified: Secondary | ICD-10-CM

## 2015-11-13 DIAGNOSIS — R32 Unspecified urinary incontinence: Secondary | ICD-10-CM | POA: Diagnosis not present

## 2015-11-13 DIAGNOSIS — Z8711 Personal history of peptic ulcer disease: Secondary | ICD-10-CM | POA: Insufficient documentation

## 2015-11-13 DIAGNOSIS — D649 Anemia, unspecified: Secondary | ICD-10-CM

## 2015-11-13 DIAGNOSIS — G4769 Other sleep related movement disorders: Secondary | ICD-10-CM | POA: Diagnosis not present

## 2015-11-13 DIAGNOSIS — R5383 Other fatigue: Secondary | ICD-10-CM | POA: Insufficient documentation

## 2015-11-13 DIAGNOSIS — R161 Splenomegaly, not elsewhere classified: Secondary | ICD-10-CM | POA: Diagnosis not present

## 2015-11-13 DIAGNOSIS — B369 Superficial mycosis, unspecified: Secondary | ICD-10-CM | POA: Insufficient documentation

## 2015-11-13 DIAGNOSIS — K7460 Unspecified cirrhosis of liver: Secondary | ICD-10-CM | POA: Diagnosis not present

## 2015-11-13 DIAGNOSIS — F5089 Other specified eating disorder: Secondary | ICD-10-CM | POA: Diagnosis not present

## 2015-11-13 DIAGNOSIS — D696 Thrombocytopenia, unspecified: Secondary | ICD-10-CM

## 2015-11-13 DIAGNOSIS — E119 Type 2 diabetes mellitus without complications: Secondary | ICD-10-CM

## 2015-11-13 DIAGNOSIS — G2581 Restless legs syndrome: Secondary | ICD-10-CM | POA: Insufficient documentation

## 2015-11-13 DIAGNOSIS — Z8673 Personal history of transient ischemic attack (TIA), and cerebral infarction without residual deficits: Secondary | ICD-10-CM | POA: Diagnosis not present

## 2015-11-13 DIAGNOSIS — G4730 Sleep apnea, unspecified: Secondary | ICD-10-CM

## 2015-11-13 DIAGNOSIS — Z86010 Personal history of colonic polyps: Secondary | ICD-10-CM | POA: Insufficient documentation

## 2015-11-13 LAB — IRON AND TIBC
Iron: 29 ug/dL (ref 28–170)
Saturation Ratios: 7 % — ABNORMAL LOW (ref 10.4–31.8)
TIBC: 391 ug/dL (ref 250–450)
UIBC: 362 ug/dL

## 2015-11-13 LAB — CBC WITH DIFFERENTIAL/PLATELET
Basophils Absolute: 0 10*3/uL (ref 0–0.1)
Basophils Relative: 1 %
Eosinophils Absolute: 0.1 10*3/uL (ref 0–0.7)
Eosinophils Relative: 2 %
HCT: 29.7 % — ABNORMAL LOW (ref 35.0–47.0)
Hemoglobin: 9.8 g/dL — ABNORMAL LOW (ref 12.0–16.0)
Lymphocytes Relative: 17 %
Lymphs Abs: 0.8 10*3/uL — ABNORMAL LOW (ref 1.0–3.6)
MCH: 26.6 pg (ref 26.0–34.0)
MCHC: 32.9 g/dL (ref 32.0–36.0)
MCV: 80.9 fL (ref 80.0–100.0)
Monocytes Absolute: 0.4 10*3/uL (ref 0.2–0.9)
Monocytes Relative: 9 %
Neutro Abs: 3.6 10*3/uL (ref 1.4–6.5)
Neutrophils Relative %: 71 %
Platelets: 111 10*3/uL — ABNORMAL LOW (ref 150–440)
RBC: 3.67 MIL/uL — ABNORMAL LOW (ref 3.80–5.20)
RDW: 17.3 % — ABNORMAL HIGH (ref 11.5–14.5)
WBC: 5 10*3/uL (ref 3.6–11.0)

## 2015-11-13 LAB — FERRITIN: Ferritin: 15 ng/mL (ref 11–307)

## 2015-11-13 LAB — VITAMIN B12: Vitamin B-12: 431 pg/mL (ref 180–914)

## 2015-11-13 LAB — FOLATE: Folate: 29 ng/mL (ref >5.9)

## 2015-11-13 NOTE — Telephone Encounter (Signed)
Called pt per MD pt to start oral iron 365m daily with vitamin C. Pt verbalized an understanding and stated I will do that.  No other concerns noted.

## 2015-11-13 NOTE — Progress Notes (Signed)
Pt reports moderate fatigue always, recent fall a month ago and pain and tingling in both feet.

## 2015-11-13 NOTE — Progress Notes (Signed)
[facility] day:  11/13/2015  Chief Complaint: Stephanie [name] is a 65 y.o. female with chronic thrombocytopenia and mild anemia who is seen for 6 month assessment.  HPI: The patient was last seen in the medical oncology clinic on 05/11/2015.  At that time, symptomatically, she noted chronic fatigue.  She had some abdominal discomfort and loose stools.  Hematocrit was normal (37.0) and platelet count was mildly low (146,000).  During the interim, she has been fatigued.  She notes ice pica for the past month.  She denies any melena or hematochezia.  Diet is good.  She notes an infection in her right foot for which antibiotics are starting on Monday.  She notes an abdominal fungal rash   Past Medical History  Diagnosis Date  . Adenomatous colon polyp 08/27/2014    Polyps x 3  . Insomnia   . RLS (restless legs syndrome)   . Colon polyp   . Incontinence   . H/O: rheumatic fever   . Hyperlipidemia   . Unspecified disorders of nervous system   . Bipolar affective disorder ([place])     h/o  . H/O: CVA (cardiovascular accident)     TIA  . Fatty liver 04/09/08    found in abd CT  . Shortness of breath   . Obstructive sleep apnea     not using CPAP, last sleep study 2.5 years ago, per pt doctor is aware  . Blood transfusion 2000  . GERD (gastroesophageal reflux disease)   . Arthritis   . 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 Asthma   . Anxiety   . Depression   . Stroke [facility])     TIA- 2002  . Ulcer   . Diabetes mellitus without complication ([place])   . Anemia   . Blood transfusion without reported diagnosis   . Sleep apnea   . Platelets decreased ([place])   . 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 Chronic abdominal pain     abdominal wall pain    Past Surgical History  Procedure Laterality Date  . Back surgery      x 45  . Cholecystectomy    . Rotator cuff repair      left  . Abdominal hysterectomy      total  . Lumbar fusion   10/09  . Hemorroidectomy      and colon polyp removed  . Abdominal exploration surgery    . Posterior cervical fusion/foraminotomy    . Eye surgery      bilat  . Lumbar wound debridement  05/09/2011    Procedure: LUMBAR WOUND DEBRIDEMENT;  Surgeon: Dahlia Bailiff;  Location: [place];  Service: Orthopedics;  Laterality: N/A;  IRRIGATION AND DEBRIDEMENT SPINAL WOUND  . Axillary artery cannulation via 8-mm hemashield graft, median sternotomy, extracorporeal circulation with deep hypothermic circulatory arrest, repair of  aortic dessection  01/23/2009    Dr Arlyce Dice  . Cataract      both eyes  . Colonoscopy    . Liver biopsy  04/10/2012    Procedure: LIVER BIOPSY;  Surgeon: Inda Castle, MD;  Location: [facility];  Service: Endoscopy;  Laterality: N/A;  ultrasound to mark order for abd limited/liver to be marked to be sent by [email or web address]     Family History  Problem Relation Age of Onset  . Colon cancer Neg Hx   . Esophageal cancer Neg Hx   .  Rectal cancer Neg Hx   . Breast cancer Mother   . Lung cancer Mother   . Stomach cancer Father   . Diabetes      4 aunts, and 1 uncle    Social History:  reports that she quit smoking about 2 years ago. Her smoking use included Cigarettes. She has a 75 pack-year smoking history. She has never used smokeless tobacco. She reports that she does not drink alcohol or use illicit drugs.  The patient is alone today.  Allergies:  Allergies  Allergen Reactions  . Morphine And Related Other (See Comments)    Reaction:  Hypotension  . Doxycycline Hives, Swelling and Other (See Comments)    Reaction:  Facial swelling     Current Medications: Current Outpatient Prescriptions  Medication Sig Dispense Refill  . acetaminophen (TYLENOL) 325 MG tablet Take 650 mg by mouth every 4 (four) hours as needed for mild pain.    . ARIPiprazole (ABILIFY) 30 MG tablet Take 30 mg by mouth daily.    . budesonide-formoterol (SYMBICORT) 160-4.5 MCG/ACT inhaler Inhale 2  puffs into the lungs 2 (two) times daily.     . clonazePAM (KLONOPIN) 0.5 MG tablet Take 0.5 mg by mouth 2 (two) times daily as needed for anxiety.    . dicyclomine (BENTYL) 10 MG capsule Take 1 capsule (10 mg total) by mouth 3 (three) times daily before meals. 90 capsule 3  . docusate sodium (COLACE) 100 MG capsule Take 100 mg by mouth 2 (two) times daily.     Marland Kitchen HYDROcodone-acetaminophen (NORCO/VICODIN) 5-325 MG tablet Take 1 tab by mouth every 4 hours as needed for moderate pain. Take 2 tabs by mouth every 4 hours as needed for severe pain.    Marland Kitchen insulin lispro (HUMALOG) 100 UNIT/ML injection Inject into the skin 3 (three) times daily before meals. 150-200= 0 units, 201-250= 5 units, 251-300= 8 units, 301-350= 9 units, >350 CALL MD    . lamoTRIgine (LAMICTAL) 100 MG tablet Take 1 tablet (100 mg total) by mouth every morning. 30 tablet 0  . lamoTRIgine (LAMICTAL) 200 MG tablet Take 1 tablet (200 mg total) by mouth at bedtime. 30 tablet 0  . metoCLOPramide (REGLAN) 10 MG tablet Take 10 mg by mouth 4 (four) times daily as needed (chronic epigastric pain).    . mirtazapine (REMERON) 15 MG tablet Take 15 mg by mouth at bedtime.     Marland Kitchen nystatin (MYCOSTATIN/NYSTOP) 100000 UNIT/GM POWD Apply 1 g topically 2 (two) times daily as needed (for rash).     Marland Kitchen omeprazole (PRILOSEC) 20 MG capsule Take 40 mg by mouth daily.     Marland Kitchen oxyCODONE (OXY IR/ROXICODONE) 5 MG immediate release tablet Take 1 - 2 tablets by mouth every 4 hours as needed for moderate to severe pain.    . pregabalin (LYRICA) 50 MG capsule Take 1 capsule (50 mg total) by mouth 3 (three) times daily. 90 capsule 1  . rOPINIRole (REQUIP) 3 MG tablet Take 3 mg by mouth at bedtime.     . simvastatin (ZOCOR) 10 MG tablet Take 10 mg by mouth at bedtime.     . tolterodine (DETROL) 2 MG tablet Take 2 mg by mouth daily.     Marland Kitchen zolpidem (AMBIEN) 5 MG tablet Take 5 mg by mouth at bedtime.      No current facility-administered medications for this visit.     Review of Systems:  GENERAL:  Feels weak.  No fevers or sweats.  Weight up 6 pounds.  PERFORMANCE STATUS (ECOG):  1 HEENT:  No visual changes, runny nose, sore throat, mouth sores or tenderness. Lungs: No shortness of breath or cough.  No hemoptysis. Cardiac:  No chest pain, palpitations, orthopnea, or PND. GI: Eating well.  Ice pica.  No nausea, vomiting, diarrhea, constipation, melena or hematochezia. GU:  No urgency, frequency, dysuria, or hematuria. Musculoskeletal:  Right hip bursitis/arthritis.  No back pain.  No muscle tenderness. Extremities:  No pain or swelling. Skin:  Yeast infection lower abdominal wall.  Infection in lower leg. Neuro:  Tingling in feet.  No focal weakness.  No seizures.  No headache, numbness or weakness, balance or coordination issues. Endocrine:  Diabetes.  No thyroid issues, hot flashes or night sweats. Psych:  No mood changes, depression or anxiety. Pain:  No focal pain. Review of systems:  All other systems reviewed and found to be negative.  Physical Exam: Blood pressure 127/62, pulse 82, temperature 95.7 F (35.4 C), temperature source Tympanic, resp. rate 18, height 5' 3"  (1.6 m), weight 227 lb 11.8 oz (103.3 kg). GENERAL:  Well developed, well nourished, heavyset woman sitting comfortably in the exam room in no acute distress. MENTAL STATUS:  Alert and oriented to person, place and time. HEAD:  Short gray hair.  Normocephalic, atraumatic, face symmetric, no Cushingoid features. EYES:  Blue eyes s/p cataract surgery.  Pupils equal round and reactive to light and accomodation.  No conjunctivitis or scleral icterus. ENT:  Oropharynx clear without lesion.  Dentures.  Tongue normal. Mucous membranes moist.  RESPIRATORY:  Clear to auscultation without rales, wheezes or rhonchi. CARDIOVASCULAR:  Regular rate and rhythm without murmur, rub or gallop. ABDOMEN:  Soft, fully round, with active bowel sounds, and no appreciable hepatosplenomegaly.  No  guarding or rebound tenderness.  No masses. SKIN:  Signficant moist lower abdominal Candidal function infection.  Mild right lower extremity erythema without significant increased warmth. EXTREMITIES:  Chronic lower extremity edema.  No palpable cords. LYMPH NODES: No palpable cervical, supraclavicular, axillary or inguinal adenopathy  NEUROLOGICAL: Unremarkable. PSYCH:  Appropriate.   Appointment on 11/13/2015  Component Date Value Ref Range Status  . WBC 11/13/2015 5.0  3.6 - 11.0 K/uL Final  . RBC 11/13/2015 3.67* 3.80 - 5.20 MIL/uL Final  . Hemoglobin 11/13/2015 9.8* 12.0 - 16.0 g/dL Final  . HCT 11/13/2015 29.7* 35.0 - 47.0 % Final  . MCV 11/13/2015 80.9  80.0 - 100.0 fL Final  . MCH 11/13/2015 26.6  26.0 - 34.0 pg Final  . MCHC 11/13/2015 32.9  32.0 - 36.0 g/dL Final  . RDW 11/13/2015 17.3* 11.5 - 14.5 % Final  . Platelets 11/13/2015 111* 150 - 440 K/uL Final  . Neutrophils Relative % 11/13/2015 71   Final  . Neutro Abs 11/13/2015 3.6  1.4 - 6.5 K/uL Final  . Lymphocytes Relative 11/13/2015 17   Final  . Lymphs Abs 11/13/2015 0.8* 1.0 - 3.6 K/uL Final  . Monocytes Relative 11/13/2015 9   Final  . Monocytes Absolute 11/13/2015 0.4  0.2 - 0.9 K/uL Final  . Eosinophils Relative 11/13/2015 2   Final  . Eosinophils Absolute 11/13/2015 0.1  0 - 0.7 K/uL Final  . Basophils Relative 11/13/2015 1   Final  . Basophils Absolute 11/13/2015 0.0  0 - 0.1 K/uL Final  . Ferritin 11/13/2015 15  11 - 307 ng/mL Final  . Folate 11/13/2015 29.0  >5.9 ng/mL Final  . Iron 11/13/2015 29  28 - 170 ug/dL Final  . TIBC 11/13/2015 391  250 -  450 ug/dL Final  . Saturation Ratios 11/13/2015 7* 10.4 - 31.8 % Final  . UIBC 11/13/2015 362   Final  . Vitamin B-12 11/13/2015 431  180 - 914 pg/mL Final   Comment: (NOTE) This assay is not validated for testing neonatal or myeloproliferative syndrome specimens for Vitamin B12 levels. Performed at [name]     Assessment:  MASHA ORBACH is a  65 y.o. 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 Platelets have ranged between 108,000 and 128,000 without trend. She denies any new medications or herbal products.  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  Work-up on 08/29/2014 revealed a positive rheumatoid factor and ANA (anti-double-stranded DNA of 5- equivocal).  Negative studies included hepatitis B surface antibody and antigen, hepatitis C testing, and HIV testing. B12, SPEP, and free light chains were normal.  Additional testing on 10/31/2014 revealed negative hepatitis B core antibody and CMV IgM.  Abdominal ultrasound on 06/27/2014 revealed mild splenomegaly (14.2 cm) with moderate cirrhosis and no focal liver lesions. AFP was normal (4) on 04/21/2015.  She denies any alcohol use.  She has had a mild normocytic anemia. Ferritin was 75 on 09/29/2014 and 20 on 05/11/2015. Iron saturation was 6%. Reticulocyte count was 2.42%. Coombs was negative. Last colonoscopy was in 08/2014.  Diet is good.  Symptomatically, she has recurrent ice pica.  She has a significant lower abdominal Candidal fungal rash.  Hematocrit has drifted down (37 to 29.7) and platelet count is stable (111,000).  Ferritin is 15 (low).  Plan: 1.  Labs today:  CBC with diff, ferritin, iron studies, B12, folate, retic. 2.  Guaiac cards x 3. 3.  Discuss likely recurrent iron deficiency anemia (ferritin was pending).  Discuss additional labs today.  Discuss guaiac cards.  Discuss IV iron if unable to replete/improve iron stores with oral iron.  Goal ferritin 100. 3.  Discuss topical Nystatin power for Candidal fungal rash.  Keep area clean and dry. 4.  Begin antibiotics for lower extremity cellulitis. 5.  RTC in 1 month for MD assessment and labs (CBC with diff and ferritin)  Addendum:  Patient was contacted regarding low ferritin.  Begin ferrous sulfate 325 mg po q day with OJ or vitamin  C.  Advance as tolerated to 1 pill BID.   Lequita Asal, MD  11/13/2015

## 2015-11-14 DIAGNOSIS — Z8711 Personal history of peptic ulcer disease: Secondary | ICD-10-CM | POA: Diagnosis not present

## 2015-11-14 DIAGNOSIS — K7460 Unspecified cirrhosis of liver: Secondary | ICD-10-CM | POA: Diagnosis not present

## 2015-11-14 DIAGNOSIS — G2581 Restless legs syndrome: Secondary | ICD-10-CM | POA: Diagnosis not present

## 2015-11-14 DIAGNOSIS — E119 Type 2 diabetes mellitus without complications: Secondary | ICD-10-CM | POA: Diagnosis not present

## 2015-11-14 DIAGNOSIS — Z86010 Personal history of colonic polyps: Secondary | ICD-10-CM | POA: Diagnosis not present

## 2015-11-14 DIAGNOSIS — B369 Superficial mycosis, unspecified: Secondary | ICD-10-CM | POA: Diagnosis not present

## 2015-11-14 DIAGNOSIS — E785 Hyperlipidemia, unspecified: Secondary | ICD-10-CM | POA: Diagnosis not present

## 2015-11-14 DIAGNOSIS — D696 Thrombocytopenia, unspecified: Secondary | ICD-10-CM | POA: Diagnosis not present

## 2015-11-14 DIAGNOSIS — Z8673 Personal history of transient ischemic attack (TIA), and cerebral infarction without residual deficits: Secondary | ICD-10-CM | POA: Diagnosis not present

## 2015-11-14 DIAGNOSIS — G4700 Insomnia, unspecified: Secondary | ICD-10-CM | POA: Diagnosis not present

## 2015-11-14 DIAGNOSIS — D649 Anemia, unspecified: Secondary | ICD-10-CM | POA: Diagnosis not present

## 2015-11-14 DIAGNOSIS — G4730 Sleep apnea, unspecified: Secondary | ICD-10-CM | POA: Diagnosis not present

## 2015-11-14 DIAGNOSIS — R5383 Other fatigue: Secondary | ICD-10-CM | POA: Diagnosis not present

## 2015-11-14 DIAGNOSIS — G4769 Other sleep related movement disorders: Secondary | ICD-10-CM | POA: Diagnosis not present

## 2015-11-14 DIAGNOSIS — R161 Splenomegaly, not elsewhere classified: Secondary | ICD-10-CM | POA: Diagnosis not present

## 2015-11-15 ENCOUNTER — Encounter: Payer: Self-pay | Admitting: Hematology and Oncology

## 2015-11-15 DIAGNOSIS — E119 Type 2 diabetes mellitus without complications: Secondary | ICD-10-CM | POA: Diagnosis not present

## 2015-11-15 DIAGNOSIS — D696 Thrombocytopenia, unspecified: Secondary | ICD-10-CM | POA: Diagnosis not present

## 2015-11-15 DIAGNOSIS — Z86010 Personal history of colonic polyps: Secondary | ICD-10-CM | POA: Diagnosis not present

## 2015-11-15 DIAGNOSIS — Z8711 Personal history of peptic ulcer disease: Secondary | ICD-10-CM | POA: Diagnosis not present

## 2015-11-15 DIAGNOSIS — R161 Splenomegaly, not elsewhere classified: Secondary | ICD-10-CM | POA: Diagnosis not present

## 2015-11-15 DIAGNOSIS — D649 Anemia, unspecified: Secondary | ICD-10-CM | POA: Diagnosis not present

## 2015-11-15 DIAGNOSIS — G4769 Other sleep related movement disorders: Secondary | ICD-10-CM | POA: Diagnosis not present

## 2015-11-15 DIAGNOSIS — G4700 Insomnia, unspecified: Secondary | ICD-10-CM | POA: Diagnosis not present

## 2015-11-15 DIAGNOSIS — K7460 Unspecified cirrhosis of liver: Secondary | ICD-10-CM | POA: Diagnosis not present

## 2015-11-15 DIAGNOSIS — R5383 Other fatigue: Secondary | ICD-10-CM | POA: Diagnosis not present

## 2015-11-15 DIAGNOSIS — E785 Hyperlipidemia, unspecified: Secondary | ICD-10-CM | POA: Diagnosis not present

## 2015-11-15 DIAGNOSIS — B369 Superficial mycosis, unspecified: Secondary | ICD-10-CM | POA: Diagnosis not present

## 2015-11-15 DIAGNOSIS — G2581 Restless legs syndrome: Secondary | ICD-10-CM | POA: Diagnosis not present

## 2015-11-15 DIAGNOSIS — Z8673 Personal history of transient ischemic attack (TIA), and cerebral infarction without residual deficits: Secondary | ICD-10-CM | POA: Diagnosis not present

## 2015-11-15 DIAGNOSIS — G4730 Sleep apnea, unspecified: Secondary | ICD-10-CM | POA: Diagnosis not present

## 2015-11-16 ENCOUNTER — Other Ambulatory Visit: Payer: Self-pay | Admitting: *Deleted

## 2015-11-16 DIAGNOSIS — K5900 Constipation, unspecified: Secondary | ICD-10-CM | POA: Diagnosis not present

## 2015-11-16 DIAGNOSIS — H01149 Xeroderma of unspecified eye, unspecified eyelid: Secondary | ICD-10-CM | POA: Diagnosis not present

## 2015-11-16 DIAGNOSIS — D649 Anemia, unspecified: Secondary | ICD-10-CM

## 2015-11-16 DIAGNOSIS — L03116 Cellulitis of left lower limb: Secondary | ICD-10-CM | POA: Diagnosis not present

## 2015-11-16 DIAGNOSIS — B379 Candidiasis, unspecified: Secondary | ICD-10-CM | POA: Diagnosis not present

## 2015-11-16 DIAGNOSIS — L03115 Cellulitis of right lower limb: Secondary | ICD-10-CM | POA: Diagnosis not present

## 2015-11-16 LAB — OCCULT BLOOD X 1 CARD TO LAB, STOOL
Fecal Occult Bld: NEGATIVE
Fecal Occult Bld: NEGATIVE
Fecal Occult Bld: NEGATIVE

## 2015-11-17 ENCOUNTER — Telehealth: Payer: Self-pay | Admitting: *Deleted

## 2015-11-17 NOTE — Telephone Encounter (Signed)
Pt called and wants to know results of test. I assume it is her stool cards and they are negative but the number [phone] belongs to another person in the [employer] system so i calle [facility] where she is at and the staff will let her know the test were negative. And that is good.

## 2015-11-18 ENCOUNTER — Emergency Department
Admission: EM | Admit: 2015-11-18 | Discharge: 2015-11-18 | Disposition: A | Discharge status: Home / Self Care | Payer: Medicare Other | Attending: Student | Admitting: Student

## 2015-11-18 ENCOUNTER — Ambulatory Visit: Payer: Medicare Other | Admitting: Pulmonary Disease

## 2015-11-18 ENCOUNTER — Encounter: Payer: Self-pay | Admitting: Emergency Medicine

## 2015-11-18 ENCOUNTER — Emergency Department: Payer: Medicare Other

## 2015-11-18 DIAGNOSIS — Z794 Long term (current) use of insulin: Secondary | ICD-10-CM | POA: Insufficient documentation

## 2015-11-18 DIAGNOSIS — Z86010 Personal history of colonic polyps: Secondary | ICD-10-CM | POA: Insufficient documentation

## 2015-11-18 DIAGNOSIS — Z8669 Personal history of other diseases of the nervous system and sense organs: Secondary | ICD-10-CM | POA: Insufficient documentation

## 2015-11-18 DIAGNOSIS — R0602 Shortness of breath: Secondary | ICD-10-CM | POA: Diagnosis not present

## 2015-11-18 DIAGNOSIS — E119 Type 2 diabetes mellitus without complications: Secondary | ICD-10-CM | POA: Diagnosis not present

## 2015-11-18 DIAGNOSIS — Z87891 Personal history of nicotine dependence: Secondary | ICD-10-CM | POA: Insufficient documentation

## 2015-11-18 DIAGNOSIS — M1990 Unspecified osteoarthritis, unspecified site: Secondary | ICD-10-CM | POA: Insufficient documentation

## 2015-11-18 DIAGNOSIS — R079 Chest pain, unspecified: Secondary | ICD-10-CM | POA: Diagnosis not present

## 2015-11-18 DIAGNOSIS — Z8673 Personal history of transient ischemic attack (TIA), and cerebral infarction without residual deficits: Secondary | ICD-10-CM | POA: Diagnosis not present

## 2015-11-18 DIAGNOSIS — E785 Hyperlipidemia, unspecified: Secondary | ICD-10-CM | POA: Diagnosis not present

## 2015-11-18 DIAGNOSIS — F319 Bipolar disorder, unspecified: Secondary | ICD-10-CM | POA: Insufficient documentation

## 2015-11-18 DIAGNOSIS — Z79899 Other long term (current) drug therapy: Secondary | ICD-10-CM | POA: Diagnosis not present

## 2015-11-18 DIAGNOSIS — J45909 Unspecified asthma, uncomplicated: Secondary | ICD-10-CM | POA: Insufficient documentation

## 2015-11-18 DIAGNOSIS — Z8679 Personal history of other diseases of the circulatory system: Secondary | ICD-10-CM | POA: Insufficient documentation

## 2015-11-18 DIAGNOSIS — I10 Essential (primary) hypertension: Secondary | ICD-10-CM | POA: Insufficient documentation

## 2015-11-18 LAB — COMPREHENSIVE METABOLIC PANEL
ALK PHOS: 210 U/L — AB (ref 38–126)
ALT: 31 U/L (ref 14–54)
AST: 46 U/L — AB (ref 15–41)
Albumin: 3.3 g/dL — ABNORMAL LOW (ref 3.5–5.0)
Anion gap: 10 (ref 5–15)
BILIRUBIN TOTAL: 0.6 mg/dL (ref 0.3–1.2)
BUN: 16 mg/dL (ref 6–20)
CALCIUM: 8.8 mg/dL — AB (ref 8.9–10.3)
CO2: 25 mmol/L (ref 22–32)
Chloride: 104 mmol/L (ref 101–111)
Creatinine, Ser: 0.84 mg/dL (ref 0.44–1.00)
GFR calc Af Amer: >60 mL/min (ref >60)
GLUCOSE: 120 mg/dL — AB (ref 65–99)
POTASSIUM: 3.8 mmol/L (ref 3.5–5.1)
Sodium: 139 mmol/L (ref 135–145)
TOTAL PROTEIN: 6.7 g/dL (ref 6.5–8.1)

## 2015-11-18 LAB — CBC WITH DIFFERENTIAL/PLATELET
BASOS ABS: 0 10*3/uL (ref 0–0.1)
BASOS PCT: 1 %
Eosinophils Absolute: 0.1 10*3/uL (ref 0–0.7)
Eosinophils Relative: 2 %
HCT: 29.3 % — ABNORMAL LOW (ref 35.0–47.0)
Hemoglobin: 9.6 g/dL — ABNORMAL LOW (ref 12.0–16.0)
LYMPHS PCT: 17 %
Lymphs Abs: 1 10*3/uL (ref 1.0–3.6)
MCH: 26.5 pg (ref 26.0–34.0)
MCHC: 32.6 g/dL (ref 32.0–36.0)
MCV: 81.3 fL (ref 80.0–100.0)
MONO ABS: 0.5 10*3/uL (ref 0.2–0.9)
Monocytes Relative: 10 %
Neutro Abs: 4 10*3/uL (ref 1.4–6.5)
Neutrophils Relative %: 70 %
PLATELETS: 125 10*3/uL — AB (ref 150–440)
RBC: 3.61 MIL/uL — AB (ref 3.80–5.20)
RDW: 17.2 % — AB (ref 11.5–14.5)
WBC: 5.7 10*3/uL (ref 3.6–11.0)

## 2015-11-18 LAB — TROPONIN I: Troponin I: <0.03 ng/mL (ref <0.031)

## 2015-11-18 LAB — FIBRIN DERIVATIVES D-DIMER (ARMC ONLY): FIBRIN DERIVATIVES D-DIMER (ARMC): 530 — AB (ref 0–499)

## 2015-11-18 MED ORDER — TRAMADOL HCL 50 MG PO TABS: 50.0000 mg | ORAL_TABLET | Freq: Four times a day (QID) | ORAL | Status: DC | PRN

## 2015-11-18 MED ORDER — IOPAMIDOL (ISOVUE-370) INJECTION 76%
100.0000 mL | Freq: Once | INTRAVENOUS | Status: AC | PRN
  Administered 2015-11-18: 100 mL via INTRAVENOUS

## 2015-11-18 MED ORDER — TRAMADOL HCL 50 MG PO TABS
50.0000 mg | ORAL_TABLET | Freq: Once | ORAL | Status: AC
  Administered 2015-11-18: 50 mg via ORAL
  Filled 2015-11-18: qty 1

## 2015-11-18 MED ORDER — SODIUM CHLORIDE 0.9 % IV BOLUS (SEPSIS)
500.0000 mL | Freq: Once | INTRAVENOUS | Status: AC
  Administered 2015-11-18: 500 mL via INTRAVENOUS

## 2015-11-18 NOTE — ED Notes (Addendum)
Pt lives at [facility], where she was walking this afternoon and began having chest pain on R chest with radiation to R shoulder and R rib cage.  Pt endorses SOB, nausea, dizziness at time of chest pain.  Pt received Aspirin 3104m and Zofran 456mIV by EMS, per report.  Pt states pain is now a 6/10, down from previous 8/10.

## 2015-11-18 NOTE — ED Notes (Signed)
Patient connected to medical equipment on her return from CT. Assisted with position change in bed and given another cup of ice.

## 2015-11-18 NOTE — ED Provider Notes (Signed)
[facility] Emergency Department Provider Note   ____________________________________________  Time seen: Approximately 4:54 PM  I have reviewed the triage vital signs and the nursing notes.   HISTORY  Chief Complaint Chest Pain    HPI Stephanie [name] is a 65 y.o. female with history of hyperlipidemia, diabetes, bipolar disorder, GERD, fibromyalgia who presents for evaluation of central and right-sided chest pain today which began suddenly just prior to arrival, gradual onset, constant, worse with movement and palpation and deep inspiration. The patient reports that she was walking down the hall at the [facility] when she began having this chest pain radiating all across the right chest. It was associated with some shortness of breath. No nausea or vomiting. On EMS arrival, it appeared that her pain was reproducible on exam, she received aspirin and Zofran for nausea. She reports her pain is much better at this time, in fact she does not have any pain "unless you push on it". No vomiting, diarrhea, fevers or chills. She is being treated with antibiotics for right lower extremity cellulitis which is getting better.   Past Medical History  Diagnosis Date  . Adenomatous colon polyp 08/27/2014    Polyps x 3  . Insomnia   . RLS (restless legs syndrome)   . Colon polyp   . Incontinence   . H/O: rheumatic fever   . Hyperlipidemia   . Unspecified disorders of nervous system   . Bipolar affective disorder ([place])     h/o  . H/O: CVA (cardiovascular accident)     TIA  . Fatty liver 04/09/08    found in abd CT  . Shortness of breath   . Obstructive sleep apnea     not using CPAP, last sleep study 2.5 years ago, per pt doctor is aware  . Blood transfusion 2000  . GERD (gastroesophageal reflux disease)   . Arthritis   . 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 Asthma   . Anxiety   . Depression   . Stroke [facility])     TIA- 2002  . Ulcer   . Diabetes mellitus without  complication ([place])   . Anemia   . Blood transfusion without reported diagnosis   . Sleep apnea   . Platelets decreased ([place])   . Cirrhosis ([place])     related to NASH  . Chronic abdominal pain     abdominal wall pain    Patient Active Problem List   Diagnosis Date Noted  . Hypoglycemia 09/09/2015  . Hyperglycemia 07/16/2015  . Neuropathy ([place]) 06/08/2015  . Thrombocytopenia ([place]) 08/19/2014  . Abdominal pain, chronic, epigastric 06/12/2014  . Liver cirrhosis secondary to NASH 02/27/2014  . Dizziness and giddiness 10/30/2013  . DOE (dyspnea on exertion) 10/30/2013  . Poorly controlled type 2 diabetes mellitus with complication ([place]) 66/44/0347  . DNR (do not resuscitate) 07/29/2013  . Encounter for routine gynecological examination 07/29/2013  . Dyspnea 07/10/2013  . Lung nodule 07/10/2013  . Varicose veins of lower extremities with other complications 42/59/5638  . GERD (gastroesophageal reflux disease) 10/09/2012  . Cirrhosis of liver ([place]) 02/02/2012  . Anxiety 07/11/2011  . Aortic dissection ([place])   . BACK PAIN, LUMBAR, CHRONIC 11/19/2009  . UNS ADVRS EFF OTH RX MEDICINAL&BIOLOGICAL SBSTNC 12/16/2008  . UNSPECIFIED VITAMIN D DEFICIENCY 09/26/2008  . TOBACCO ABUSE 06/19/2008  . RESTLESS LEGS SYNDROME 04/27/2007  . INSOMNIA 04/27/2007  . HLD (hyperlipidemia) 03/08/2007  . OSA (obstructive sleep apnea) 03/08/2007  .  Essential hypertension 03/08/2007  . Asthma 03/08/2007  . BIPOLAR AFFECTIVE DISORDER, HX OF 03/08/2007    Past Surgical History  Procedure Laterality Date  . Back surgery      x 45  . Cholecystectomy    . Rotator cuff repair      left  . Abdominal hysterectomy      total  . Lumbar fusion  10/09  . Hemorroidectomy      and colon polyp removed  . Abdominal exploration surgery    . Posterior cervical fusion/foraminotomy    . Eye surgery      bilat  . Lumbar wound debridement  05/09/2011    Procedure: LUMBAR WOUND DEBRIDEMENT;  Surgeon: Dahlia Bailiff;  Location: [place];  Service: Orthopedics;  Laterality: N/A;  IRRIGATION AND DEBRIDEMENT SPINAL WOUND  . Axillary artery cannulation via 8-mm hemashield graft, median sternotomy, extracorporeal circulation with deep hypothermic circulatory arrest, repair of  aortic dessection  01/23/2009    Dr Arlyce Dice  . Cataract      both eyes  . Colonoscopy    . Liver biopsy  04/10/2012    Procedure: LIVER BIOPSY;  Surgeon: Inda Castle, MD;  Location: [facility];  Service: Endoscopy;  Laterality: N/A;  ultrasound to mark order for abd limited/liver to be marked to be sent by [email or web address]     Current Outpatient Rx  Name  Route  Sig  Dispense  Refill  . acetaminophen (TYLENOL) 325 MG tablet   Oral   Take 650 mg by mouth every 4 (four) hours as needed for mild pain, fever or headache.          . ARIPiprazole (ABILIFY) 30 MG tablet   Oral   Take 30 mg by mouth daily.         Marland Kitchen buPROPion (WELLBUTRIN SR) 100 MG 12 hr tablet   Oral   Take 50 mg by mouth daily.         . celecoxib (CELEBREX) 100 MG capsule   Oral   Take 100 mg by mouth daily.         . cephALEXin (KEFLEX) 500 MG capsule   Oral   Take 500 mg by mouth 3 (three) times daily.         . ferrous sulfate 325 (65 FE) MG tablet   Oral   Take 325 mg by mouth daily with breakfast.         . furosemide (LASIX) 40 MG tablet   Oral   Take 40 mg by mouth 2 (two) times daily.         . insulin lispro (HUMALOG) 100 UNIT/ML injection   Subcutaneous   Inject 0-9 Units into the skin 3 (three) times daily with meals as needed for high blood sugar. Pt uses as needed per sliding scale:    150-200:  0 units  201-250:  5 units  251-300:  8 units 301-350:  9 units  Greater than 350:  Call MD         . mirtazapine (REMERON) 15 MG tablet   Oral   Take 7.5 mg by mouth at bedtime as needed (for insomnia).          . Multiple Vitamin (MULTIVITAMIN WITH MINERALS) TABS tablet   Oral   Take 1 tablet by mouth daily.           Marland Kitchen omeprazole (PRILOSEC) 40 MG capsule   Oral   Take 40 mg by mouth daily before breakfast.         .  oxybutynin (DITROPAN-XL) 5 MG 24 hr tablet   Oral   Take 5 mg by mouth daily.         Marland Kitchen oxyCODONE (OXY IR/ROXICODONE) 5 MG immediate release tablet   Oral   Take 5 mg by mouth every 12 (twelve) hours as needed for severe pain.          . potassium chloride SA (K-DUR,KLOR-CON) 20 MEQ tablet   Oral   Take 20 mEq by mouth daily.         Marland Kitchen senna (SENOKOT) 8.6 MG tablet   Oral   Take 1 tablet by mouth at bedtime.         . vitamin C (ASCORBIC ACID) 500 MG tablet   Oral   Take 500 mg by mouth 2 (two) times daily.         Marland Kitchen zolpidem (AMBIEN) 5 MG tablet   Oral   Take 5 mg by mouth at bedtime.          . dicyclomine (BENTYL) 10 MG capsule   Oral   Take 1 capsule (10 mg total) by mouth 3 (three) times daily before meals. Patient not taking: Reported on 11/18/2015   90 capsule   3   . lamoTRIgine (LAMICTAL) 100 MG tablet   Oral   Take 1 tablet (100 mg total) by mouth every morning. Patient not taking: Reported on 11/18/2015   30 tablet   0   . lamoTRIgine (LAMICTAL) 200 MG tablet   Oral   Take 1 tablet (200 mg total) by mouth at bedtime. Patient not taking: Reported on 11/18/2015   30 tablet   0   . pregabalin (LYRICA) 50 MG capsule   Oral   Take 1 capsule (50 mg total) by mouth 3 (three) times daily. Patient not taking: Reported on 11/18/2015   90 capsule   1     Allergies Morphine and related and Doxycycline  Family History  Problem Relation Age of Onset  . Colon cancer Neg Hx   . Esophageal cancer Neg Hx   . Rectal cancer Neg Hx   . Breast cancer Mother   . Lung cancer Mother   . Stomach cancer Father   . Diabetes      4 aunts, and 1 uncle    Social History Social History  Substance Use Topics  . Smoking status: Former Smoker -- 1.50 packs/day for 50 years    Types: Cigarettes    Quit date: 04/06/2013  . Smokeless tobacco: Never  Used  . Alcohol Use: No    Review of Systems Constitutional: No fever/chills Eyes: No visual changes. ENT: No sore throat. Cardiovascular: +chest pain. Respiratory: +shortness of breath. Gastrointestinal: No abdominal pain.  No nausea, no vomiting.  No diarrhea.  No constipation. Genitourinary: Negative for dysuria. Musculoskeletal: Negative for back pain. Skin: Negative for rash. Neurological: Negative for headaches, focal weakness or numbness.  10-point ROS otherwise negative.  ____________________________________________   PHYSICAL EXAM:  Filed Vitals:   11/18/15 1703 11/18/15 1800 11/18/15 1926 11/18/15 2030  BP: 107/48 103/55 99/54 117/60  Pulse:  84 86 84  Temp:      TempSrc:      Resp:  13 12 10   Height:      Weight:      SpO2:  97% 94% 94%    VITAL SIGNS: ED Triage Vitals  Enc Vitals Group     BP --      Pulse --  Resp --      Temp --      Temp src --      SpO2 --      Weight --      Height --      Head Cir --      Peak Flow --      Pain Score --      Pain Loc --      Pain Edu? --      Excl. in [place]? --     Constitutional: Alert and oriented. Well appearing and in no acute distress. Eyes: Conjunctivae are normal. PERRL. EOMI. Head: Atraumatic. Nose: No congestion/rhinnorhea. Mouth/Throat: Mucous membranes are moist.  Oropharynx non-erythematous. Neck: No stridor. Supple without meningismus.  Cardiovascular: Normal rate, regular rhythm. Grossly normal heart sounds.  Good peripheral circulation. Respiratory: Normal respiratory effort.  No retractions. Lungs CTAB. Gastrointestinal: Soft and nontender. No distention. No CVA tenderness. Genitourinary: deferred Musculoskeletal: No lower extremity tenderness nor edema.  No joint effusions. Severe point tenderness to palpation throughout the sternum and the right chest wall, when I push on the area, the patient grimaces, confirms that that is the pain that she is experiencing, attempts to move away  from the painful stimulus. She has resolving cellulitis of the distal aspect of the right foot however no significant swelling, asymmetry or tenderness. 2+ right DP pulse. Neurologic:  Normal speech and language. No gross focal neurologic deficits are appreciated.  Skin:  Skin is warm, dry and intact. No rash noted. Psychiatric: Mood and affect are normal. Speech and behavior are normal.  ____________________________________________   LABS (all labs ordered are listed, but only abnormal results are displayed)  Labs Reviewed  COMPREHENSIVE METABOLIC PANEL - Abnormal; Notable for the following:    Glucose, Bld 120 (*)    Calcium 8.8 (*)    Albumin 3.3 (*)    AST 46 (*)    Alkaline Phosphatase 210 (*)    All other components within normal limits  CBC WITH DIFFERENTIAL/PLATELET - Abnormal; Notable for the following:    RBC 3.61 (*)    Hemoglobin 9.6 (*)    HCT 29.3 (*)    RDW 17.2 (*)    Platelets 125 (*)    All other components within normal limits  FIBRIN DERIVATIVES D-DIMER ([facility] ONLY) - Abnormal; Notable for the following:    Fibrin derivatives D-dimer ([facility]) 530 (*)    All other components within normal limits  TROPONIN I  TROPONIN I   ____________________________________________  EKG  ED ECG REPORT I, Joanne Gavel, the attending physician, personally viewed and interpreted this ECG.   Date: 11/18/2015  EKG Time: 17:00  Rate: 88  Rhythm: normal sinus rhythm  Axis: normal  Intervals:none  ST&T Change: No acute ST elevation or ST depression.  ____________________________________________  RADIOLOGY  CXR ____________________________________________   PROCEDURES  Procedure(s) performed: None  Critical Care performed: No  ____________________________________________   INITIAL IMPRESSION / ASSESSMENT AND PLAN / ED COURSE  Pertinent labs & imaging results that were available during my care of the patient were reviewed by me and considered in my medical  decision making (see chart for details).  Stephanie [name] is a 65 y.o. female with history of hyperlipidemia, diabetes, bipolar disorder, GERD, fibromyalgia who presents for evaluation of central and right-sided chest pain today which began suddenly just prior to arrival. On exam, she is well-appearing and in no acute distress. Her vital signs are stable she is afebrile. Initial O2 sat  was 88% however during my assessment, as the patient is talking, her O2 sat increases from a low 90s to 96% without oxygen. Suspect this may be secondary to atelectasis, we will obtain chest x-ray and reassess. She appears to have reproducible chest wall pain on my exam and I suspect it is musculoskeletal chest pain, her EKG is reassuring however given that her pain just started, will obtain 2 sets of cardiac enzymes as well as a d-dimer given the pleuritic aspect of her pain and given her age. We'll reassess for disposition.  ----------------------------------------- 9:44 PM on 11/18/2015 ----------------------------------------- Patient reports resolution of her pain. Her vital signs are within normal limits. She continues to appear comparable. CMP with chronic alkaline phosphatase elevation. CBC with chronic anemia and she is unchanged from prior. 2 sets of troponins negative. D-dimer was elevated so CTA chest was obtained which shows no large central PE, chronic patchy densities noted in both lungs, additional chronic findings are noted but generally the exam is unchanged from CT scan obtained on 09/01/2015. There is no edema on the CTA chest. Discussed return precautions, need for close PCP follow-up and she is comfortable with the discharge plan. DC home.  ____________________________________________   FINAL CLINICAL IMPRESSION(S) / ED DIAGNOSES  Final diagnoses:  Chest pain, unspecified chest pain type      NEW MEDICATIONS STARTED DURING THIS VISIT:  New Prescriptions   No medications on file      Note:  This document was prepared using Dragon voice recognition software and may include unintentional dictation errors.    Joanne Gavel, MD 11/18/15 2147

## 2015-11-19 DIAGNOSIS — E119 Type 2 diabetes mellitus without complications: Secondary | ICD-10-CM | POA: Diagnosis not present

## 2015-11-19 DIAGNOSIS — R079 Chest pain, unspecified: Secondary | ICD-10-CM | POA: Diagnosis not present

## 2015-11-19 DIAGNOSIS — R609 Edema, unspecified: Secondary | ICD-10-CM | POA: Diagnosis not present

## 2015-11-19 DIAGNOSIS — R269 Unspecified abnormalities of gait and mobility: Secondary | ICD-10-CM | POA: Diagnosis not present

## 2015-11-26 DIAGNOSIS — Z79899 Other long term (current) drug therapy: Secondary | ICD-10-CM | POA: Diagnosis not present

## 2015-11-26 DIAGNOSIS — M2550 Pain in unspecified joint: Secondary | ICD-10-CM | POA: Diagnosis not present

## 2015-11-26 DIAGNOSIS — E119 Type 2 diabetes mellitus without complications: Secondary | ICD-10-CM | POA: Diagnosis not present

## 2015-11-26 DIAGNOSIS — R609 Edema, unspecified: Secondary | ICD-10-CM | POA: Diagnosis not present

## 2015-11-26 DIAGNOSIS — R269 Unspecified abnormalities of gait and mobility: Secondary | ICD-10-CM | POA: Diagnosis not present

## 2015-11-26 DIAGNOSIS — L03119 Cellulitis of unspecified part of limb: Secondary | ICD-10-CM | POA: Diagnosis not present

## 2015-12-03 DIAGNOSIS — R269 Unspecified abnormalities of gait and mobility: Secondary | ICD-10-CM | POA: Diagnosis not present

## 2015-12-03 DIAGNOSIS — M2550 Pain in unspecified joint: Secondary | ICD-10-CM | POA: Diagnosis not present

## 2015-12-03 DIAGNOSIS — R609 Edema, unspecified: Secondary | ICD-10-CM | POA: Diagnosis not present

## 2015-12-03 DIAGNOSIS — E119 Type 2 diabetes mellitus without complications: Secondary | ICD-10-CM | POA: Diagnosis not present

## 2015-12-07 DIAGNOSIS — E119 Type 2 diabetes mellitus without complications: Secondary | ICD-10-CM | POA: Diagnosis not present

## 2015-12-07 DIAGNOSIS — G4700 Insomnia, unspecified: Secondary | ICD-10-CM | POA: Diagnosis not present

## 2015-12-07 DIAGNOSIS — E1140 Type 2 diabetes mellitus with diabetic neuropathy, unspecified: Secondary | ICD-10-CM | POA: Diagnosis not present

## 2015-12-07 DIAGNOSIS — R609 Edema, unspecified: Secondary | ICD-10-CM | POA: Diagnosis not present

## 2015-12-08 DIAGNOSIS — R600 Localized edema: Secondary | ICD-10-CM | POA: Diagnosis not present

## 2015-12-08 DIAGNOSIS — M79606 Pain in leg, unspecified: Secondary | ICD-10-CM | POA: Diagnosis not present

## 2015-12-09 ENCOUNTER — Encounter: Payer: Self-pay | Admitting: Emergency Medicine

## 2015-12-09 ENCOUNTER — Emergency Department: Payer: Medicare Other

## 2015-12-09 ENCOUNTER — Emergency Department
Admission: EM | Admit: 2015-12-09 | Discharge: 2015-12-09 | Disposition: A | Discharge status: Home / Self Care | Payer: Medicare Other | Attending: Emergency Medicine | Admitting: Emergency Medicine

## 2015-12-09 DIAGNOSIS — R1013 Epigastric pain: Secondary | ICD-10-CM | POA: Insufficient documentation

## 2015-12-09 DIAGNOSIS — F319 Bipolar disorder, unspecified: Secondary | ICD-10-CM | POA: Diagnosis not present

## 2015-12-09 DIAGNOSIS — M1990 Unspecified osteoarthritis, unspecified site: Secondary | ICD-10-CM | POA: Insufficient documentation

## 2015-12-09 DIAGNOSIS — Z7951 Long term (current) use of inhaled steroids: Secondary | ICD-10-CM | POA: Diagnosis not present

## 2015-12-09 DIAGNOSIS — R079 Chest pain, unspecified: Secondary | ICD-10-CM | POA: Diagnosis not present

## 2015-12-09 DIAGNOSIS — E119 Type 2 diabetes mellitus without complications: Secondary | ICD-10-CM | POA: Diagnosis not present

## 2015-12-09 DIAGNOSIS — Z87891 Personal history of nicotine dependence: Secondary | ICD-10-CM | POA: Insufficient documentation

## 2015-12-09 DIAGNOSIS — Z8673 Personal history of transient ischemic attack (TIA), and cerebral infarction without residual deficits: Secondary | ICD-10-CM | POA: Diagnosis not present

## 2015-12-09 DIAGNOSIS — Z794 Long term (current) use of insulin: Secondary | ICD-10-CM | POA: Insufficient documentation

## 2015-12-09 DIAGNOSIS — J45909 Unspecified asthma, uncomplicated: Secondary | ICD-10-CM | POA: Diagnosis not present

## 2015-12-09 DIAGNOSIS — K219 Gastro-esophageal reflux disease without esophagitis: Secondary | ICD-10-CM

## 2015-12-09 DIAGNOSIS — E785 Hyperlipidemia, unspecified: Secondary | ICD-10-CM | POA: Diagnosis not present

## 2015-12-09 DIAGNOSIS — Z79899 Other long term (current) drug therapy: Secondary | ICD-10-CM | POA: Diagnosis not present

## 2015-12-09 DIAGNOSIS — R0602 Shortness of breath: Secondary | ICD-10-CM | POA: Diagnosis not present

## 2015-12-09 DIAGNOSIS — R1010 Upper abdominal pain, unspecified: Secondary | ICD-10-CM | POA: Diagnosis not present

## 2015-12-09 LAB — CBC WITH DIFFERENTIAL/PLATELET
BASOS ABS: 0 10*3/uL (ref 0–0.1)
Basophils Relative: 0 %
Eosinophils Absolute: 0.1 10*3/uL (ref 0–0.7)
Eosinophils Relative: 1 %
HCT: 34 % — ABNORMAL LOW (ref 35.0–47.0)
HEMOGLOBIN: 11.2 g/dL — AB (ref 12.0–16.0)
LYMPHS ABS: 0.5 10*3/uL — AB (ref 1.0–3.6)
MCH: 27.8 pg (ref 26.0–34.0)
MCHC: 32.8 g/dL (ref 32.0–36.0)
MCV: 84.5 fL (ref 80.0–100.0)
Monocytes Absolute: 0.7 10*3/uL (ref 0.2–0.9)
Monocytes Relative: 11 %
NEUTROS ABS: 4.7 10*3/uL (ref 1.4–6.5)
Platelets: 88 10*3/uL — ABNORMAL LOW (ref 150–440)
RBC: 4.02 MIL/uL (ref 3.80–5.20)
RDW: 21.1 % — ABNORMAL HIGH (ref 11.5–14.5)
WBC: 6 10*3/uL (ref 3.6–11.0)

## 2015-12-09 LAB — COMPREHENSIVE METABOLIC PANEL
ALBUMIN: 3.6 g/dL (ref 3.5–5.0)
ALT: 29 U/L (ref 14–54)
ANION GAP: 7 (ref 5–15)
AST: 43 U/L — ABNORMAL HIGH (ref 15–41)
Alkaline Phosphatase: 176 U/L — ABNORMAL HIGH (ref 38–126)
BUN: 14 mg/dL (ref 6–20)
CHLORIDE: 106 mmol/L (ref 101–111)
CO2: 29 mmol/L (ref 22–32)
Calcium: 9.1 mg/dL (ref 8.9–10.3)
Creatinine, Ser: 0.85 mg/dL (ref 0.44–1.00)
GFR calc non Af Amer: >60 mL/min (ref >60)
GLUCOSE: 128 mg/dL — AB (ref 65–99)
POTASSIUM: 4 mmol/L (ref 3.5–5.1)
SODIUM: 142 mmol/L (ref 135–145)
Total Bilirubin: 0.8 mg/dL (ref 0.3–1.2)
Total Protein: 6.9 g/dL (ref 6.5–8.1)

## 2015-12-09 LAB — TROPONIN I

## 2015-12-09 LAB — URINALYSIS COMPLETE WITH MICROSCOPIC (ARMC ONLY)
BACTERIA UA: NONE SEEN
Bilirubin Urine: NEGATIVE
Glucose, UA: NEGATIVE mg/dL
Hgb urine dipstick: NEGATIVE
Ketones, ur: NEGATIVE mg/dL
Leukocytes, UA: NEGATIVE
Nitrite: NEGATIVE
PH: 5 (ref 5.0–8.0)
PROTEIN: NEGATIVE mg/dL
Specific Gravity, Urine: 1.005 (ref 1.005–1.030)

## 2015-12-09 LAB — LIPASE, BLOOD: Lipase: 17 U/L (ref 11–51)

## 2015-12-09 MED ORDER — SUCRALFATE 1 G PO TABS
1.0000 g | ORAL_TABLET | Freq: Once | ORAL | Status: AC
  Administered 2015-12-09: 1 g via ORAL
  Filled 2015-12-09: qty 1

## 2015-12-09 MED ORDER — FAMOTIDINE 20 MG PO TABS: 20.0000 mg | ORAL_TABLET | Freq: Two times a day (BID) | ORAL | Status: DC

## 2015-12-09 MED ORDER — ONDANSETRON HCL 4 MG/2ML IJ SOLN
4.0000 mg | Freq: Once | INTRAMUSCULAR | Status: AC
  Administered 2015-12-09: 4 mg via INTRAVENOUS
  Filled 2015-12-09: qty 2

## 2015-12-09 MED ORDER — METOCLOPRAMIDE HCL 10 MG PO TABS: 10.0000 mg | ORAL_TABLET | Freq: Three times a day (TID) | ORAL | Status: DC

## 2015-12-09 MED ORDER — GI COCKTAIL ~~LOC~~
30.0000 mL | ORAL | Status: AC
  Administered 2015-12-09: 30 mL via ORAL
  Filled 2015-12-09: qty 30

## 2015-12-09 MED ORDER — SODIUM CHLORIDE 0.9 % IV BOLUS (SEPSIS)
1000.0000 mL | Freq: Once | INTRAVENOUS | Status: AC
  Administered 2015-12-09: 1000 mL via INTRAVENOUS

## 2015-12-09 MED ORDER — DIATRIZOATE MEGLUMINE & SODIUM 66-10 % PO SOLN
15.0000 mL | Freq: Once | ORAL | Status: AC
  Administered 2015-12-09: 15 mL via ORAL

## 2015-12-09 MED ORDER — FAMOTIDINE 20 MG PO TABS
40.0000 mg | ORAL_TABLET | Freq: Once | ORAL | Status: AC
  Administered 2015-12-09: 40 mg via ORAL
  Filled 2015-12-09: qty 2

## 2015-12-09 MED ORDER — IOPAMIDOL (ISOVUE-370) INJECTION 76%
125.0000 mL | Freq: Once | INTRAVENOUS | Status: AC | PRN
  Administered 2015-12-09: 125 mL via INTRAVENOUS

## 2015-12-09 MED ORDER — METOCLOPRAMIDE HCL 5 MG/ML IJ SOLN
10.0000 mg | Freq: Once | INTRAMUSCULAR | Status: AC
  Administered 2015-12-09: 10 mg via INTRAVENOUS
  Filled 2015-12-09: qty 2

## 2015-12-09 MED ORDER — KETOROLAC TROMETHAMINE 30 MG/ML IJ SOLN
15.0000 mg | Freq: Once | INTRAMUSCULAR | Status: AC
  Administered 2015-12-09: 15 mg via INTRAVENOUS
  Filled 2015-12-09: qty 1

## 2015-12-09 MED ORDER — SUCRALFATE 1 G PO TABS: 1.0000 g | ORAL_TABLET | Freq: Four times a day (QID) | ORAL | Status: DC

## 2015-12-09 NOTE — ED Notes (Signed)
Howard MD in room, asked to wait on transporting pt until he finished speaking with pt and family.

## 2015-12-09 NOTE — ED Notes (Signed)
Attempted to call report twice, no answer.

## 2015-12-09 NOTE — ED Notes (Signed)
Pt arrived via ems from [facility] where she called ems for chest pain. Pt states pain started this morning at 7:00am and called ems at 9:00. EMS came out and found the pt to be feeling better and vitals WDL. Pt called ems for the second time after the pain began to increase and she became short of breath. Pt O2 saturation 94% on room air, NSR on monitor, and HR 89. Pt complains of pain in center of the chest that increases with palpitation.

## 2015-12-09 NOTE — Discharge Instructions (Signed)
Abdominal Pain, Adult Many things can cause abdominal pain. Usually, abdominal pain is not caused by a disease and will improve without treatment. It can often be observed and treated at home. Your health care provider will do a physical exam and possibly order blood tests and X-rays to help determine the seriousness of your pain. However, in many cases, more time must pass before a clear cause of the pain can be found. Before that point, your health care provider may not know if you need more testing or further treatment. HOME CARE INSTRUCTIONS Monitor your abdominal pain for any changes. The following actions may help to alleviate any discomfort you are experiencing:  Only take over-the-counter or prescription medicines as directed by your health care provider.  Do not take laxatives unless directed to do so by your health care provider.  Try a clear liquid diet (broth, tea, or water) as directed by your health care provider. Slowly move to a bland diet as tolerated. SEEK MEDICAL CARE IF:  You have unexplained abdominal pain.  You have abdominal pain associated with nausea or diarrhea.  You have pain when you urinate or have a bowel movement.  You experience abdominal pain that wakes you in the night.  You have abdominal pain that is worsened or improved by eating food.  You have abdominal pain that is worsened with eating fatty foods.  You have a fever. SEEK IMMEDIATE MEDICAL CARE IF:  Your pain does not go away within 2 hours.  You keep throwing up (vomiting).  Your pain is felt only in portions of the abdomen, such as the right side or the left lower portion of the abdomen.  You pass bloody or black tarry stools. MAKE SURE YOU:  Understand these instructions.  Will watch your condition.  Will get help right away if you are not doing well or get worse.   This information is not intended to replace advice given to you by your health care provider. Make sure you discuss  any questions you have with your health care provider.   Document Released: 03/02/2005 Document Revised: 02/11/2015 Document Reviewed: 01/30/2013 Elsevier Interactive Patient Education 2016 [employer].  Gastroesophageal Reflux Disease, Adult Normally, food travels down the esophagus and stays in the stomach to be digested. However, when a person has gastroesophageal reflux disease (GERD), food and stomach acid move back up into the esophagus. When this happens, the esophagus becomes sore and inflamed. Over time, GERD can create small holes (ulcers) in the lining of the esophagus.  CAUSES This condition is caused by a problem with the muscle between the esophagus and the stomach (lower esophageal sphincter, or LES). Normally, the LES muscle closes after food passes through the esophagus to the stomach. When the LES is weakened or abnormal, it does not close properly, and that allows food and stomach acid to go back up into the esophagus. The LES can be weakened by certain dietary substances, medicines, and medical conditions, including:  Tobacco use.  Pregnancy.  Having a hiatal hernia.  Heavy alcohol use.  Certain foods and beverages, such as coffee, chocolate, onions, and peppermint. RISK FACTORS This condition is more likely to develop in:  People who have an increased body weight.  People who have connective tissue disorders.  People who use NSAID medicines. SYMPTOMS Symptoms of this condition include:  Heartburn.  Difficult or painful swallowing.  The feeling of having a lump in the throat.  Abitter taste in the mouth.  Bad breath.  Having  a large amount of saliva.  Having an upset or bloated stomach.  Belching.  Chest pain.  Shortness of breath or wheezing.  Ongoing (chronic) cough or a night-time cough.  Wearing away of tooth enamel.  Weight loss. Different conditions can cause chest pain. Make sure to see your health care provider if you experience  chest pain. DIAGNOSIS Your health care provider will take a medical history and perform a physical exam. To determine if you have mild or severe GERD, your health care provider may also monitor how you respond to treatment. You may also have other tests, including:  An endoscopy toexamine your stomach and esophagus with a small camera.  A test thatmeasures the acidity level in your esophagus.  A test thatmeasures how much pressure is on your esophagus.  A barium swallow or modified barium swallow to show the shape, size, and functioning of your esophagus. TREATMENT The goal of treatment is to help relieve your symptoms and to prevent complications. Treatment for this condition may vary depending on how severe your symptoms are. Your health care provider may recommend:  Changes to your diet.  Medicine.  Surgery. HOME CARE INSTRUCTIONS Diet  Follow a diet as recommended by your health care provider. This may involve avoiding foods and drinks such as:  Coffee and tea (with or without caffeine).  Drinks that containalcohol.  Energy drinks and sports drinks.  Carbonated drinks or sodas.  Chocolate and cocoa.  Peppermint and mint flavorings.  Garlic and onions.  Horseradish.  Spicy and acidic foods, including peppers, chili powder, curry powder, vinegar, hot sauces, and barbecue sauce.  Citrus fruit juices and citrus fruits, such as oranges, lemons, and limes.  Tomato-based foods, such as red sauce, chili, salsa, and pizza with red sauce.  Fried and fatty foods, such as donuts, french fries, potato chips, and high-fat dressings.  High-fat meats, such as hot dogs and fatty cuts of red and white meats, such as rib eye steak, sausage, ham, and bacon.  High-fat dairy items, such as whole milk, butter, and cream cheese.  Eat small, frequent meals instead of large meals.  Avoid drinking large amounts of liquid with your meals.  Avoid eating meals during the 2-3 hours  before bedtime.  Avoid lying down right after you eat.  Do not exercise right after you eat. General Instructions  Pay attention to any changes in your symptoms.  Take over-the-counter and prescription medicines only as told by your health care provider. Do not take aspirin, ibuprofen, or other NSAIDs unless your health care provider told you to do so.  Do not use any tobacco products, including cigarettes, chewing tobacco, and e-cigarettes. If you need help quitting, ask your health care provider.  Wear loose-fitting clothing. Do not wear anything tight around your waist that causes pressure on your abdomen.  Raise (elevate) the head of your bed 6 inches (15cm).  Try to reduce your stress, such as with yoga or meditation. If you need help reducing stress, ask your health care provider.  If you are overweight, reduce your weight to an amount that is healthy for you. Ask your health care provider for guidance about a safe weight loss goal.  Keep all follow-up visits as told by your health care provider. This is important. SEEK MEDICAL CARE IF:  You have new symptoms.  You have unexplained weight loss.  You have difficulty swallowing, or it hurts to swallow.  You have wheezing or a persistent cough.  Your symptoms do not  improve with treatment.  You have a hoarse voice. SEEK IMMEDIATE MEDICAL CARE IF:  You have pain in your arms, neck, jaw, teeth, or back.  You feel sweaty, dizzy, or light-headed.  You have chest pain or shortness of breath.  You vomit and your vomit looks like blood or coffee grounds.  You faint.  Your stool is bloody or black.  You cannot swallow, drink, or eat.   This information is not intended to replace advice given to you by your health care provider. Make sure you discuss any questions you have with your health care provider.   Document Released: 03/02/2005 Document Revised: 02/11/2015 Document Reviewed: 09/17/2014 Elsevier Interactive  Patient Education [employer].

## 2015-12-09 NOTE — ED Provider Notes (Signed)
[facility] Emergency Department Provider Note  ____________________________________________  Time seen: 12:50 PM  I have reviewed the triage vital signs and the nursing notes.   HISTORY  Chief Complaint Epigastric pain.   HPI Stephanie [name] is a 65 y.o. female complains of epigastric pain that started at 7 AM today. It lasted for about 2 hours, resolved and then occurred again. She comes to the hospital for evaluation due to the recurrent nature and reporting that it is severe. Hurts with movement and palpation of the area. She does state she has a history of peptic ulcers. She reports compliance with her medications. Also says that it hurts to breathe. Decreased oral intake over the last 24 hours. Denies any alleviating factors. No vomiting or diarrhea.  Condition initially described this as chest pain but indicates the epigastric area when asked to locate the pain   Past Medical History  Diagnosis Date  . Adenomatous colon polyp 08/27/2014    Polyps x 3  . Insomnia   . RLS (restless legs syndrome)   . Colon polyp   . Incontinence   . H/O: rheumatic fever   . Hyperlipidemia   . Unspecified disorders of nervous system   . Bipolar affective disorder ([place])     h/o  . H/O: CVA (cardiovascular accident)     TIA  . Fatty liver 04/09/08    found in abd CT  . Shortness of breath   . Obstructive sleep apnea     not using CPAP, last sleep study 2.5 years ago, per pt doctor is aware  . Blood transfusion 2000  . GERD (gastroesophageal reflux disease)   . Arthritis   . 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 Asthma   . Anxiety   . Depression   . Stroke [facility])     TIA- 2002  . Ulcer   . Diabetes mellitus without complication ([place])   . Anemia   . Blood transfusion without reported diagnosis   . Sleep apnea   . Platelets decreased ([place])   . Cirrhosis ([place])     related to NASH  . Chronic abdominal pain     abdominal wall pain     Patient Active  Problem List   Diagnosis Date Noted  . Hypoglycemia 09/09/2015  . Hyperglycemia 07/16/2015  . Neuropathy ([place]) 06/08/2015  . Thrombocytopenia ([place]) 08/19/2014  . Abdominal pain, chronic, epigastric 06/12/2014  . Liver cirrhosis secondary to NASH 02/27/2014  . Dizziness and giddiness 10/30/2013  . DOE (dyspnea on exertion) 10/30/2013  . Poorly controlled type 2 diabetes mellitus with complication ([place]) 40/34/7425  . DNR (do not resuscitate) 07/29/2013  . Encounter for routine gynecological examination 07/29/2013  . Dyspnea 07/10/2013  . Lung nodule 07/10/2013  . Varicose veins of lower extremities with other complications 95/63/8756  . GERD (gastroesophageal reflux disease) 10/09/2012  . Cirrhosis of liver ([place]) 02/02/2012  . Anxiety 07/11/2011  . Aortic dissection ([place])   . BACK PAIN, LUMBAR, CHRONIC 11/19/2009  . UNS ADVRS EFF OTH RX MEDICINAL&BIOLOGICAL SBSTNC 12/16/2008  . UNSPECIFIED VITAMIN D DEFICIENCY 09/26/2008  . TOBACCO ABUSE 06/19/2008  . RESTLESS LEGS SYNDROME 04/27/2007  . INSOMNIA 04/27/2007  . HLD (hyperlipidemia) 03/08/2007  . OSA (obstructive sleep apnea) 03/08/2007  . Essential hypertension 03/08/2007  . Asthma 03/08/2007  . BIPOLAR AFFECTIVE DISORDER, HX OF 03/08/2007     Past Surgical History  Procedure Laterality Date  . Back surgery      x 45  .  Cholecystectomy    . Rotator cuff repair      left  . Abdominal hysterectomy      total  . Lumbar fusion  10/09  . Hemorroidectomy      and colon polyp removed  . Abdominal exploration surgery    . Posterior cervical fusion/foraminotomy    . Eye surgery      bilat  . Lumbar wound debridement  05/09/2011    Procedure: LUMBAR WOUND DEBRIDEMENT;  Surgeon: Dahlia Bailiff;  Location: [place];  Service: Orthopedics;  Laterality: N/A;  IRRIGATION AND DEBRIDEMENT SPINAL WOUND  . Axillary artery cannulation via 8-mm hemashield graft, median sternotomy, extracorporeal circulation with deep hypothermic circulatory  arrest, repair of  aortic dessection  01/23/2009    Dr Arlyce Dice  . Cataract      both eyes  . Colonoscopy    . Liver biopsy  04/10/2012    Procedure: LIVER BIOPSY;  Surgeon: Inda Castle, MD;  Location: [facility] ENDOS[facility];  Service: Endoscopy;  Laterality: N/A;  ultrasound to mark order for abd limited/liver to be marked to be sent by [email or web address]      Current Outpatient Rx  Name  Route  Sig  Dispense  Refill  . acidophilus (RISAQUAD) CAPS capsule   Oral   Take 1 capsule by mouth daily.         . ARIPiprazole (ABILIFY) 30 MG tablet   Oral   Take 30 mg by mouth daily.         . budesonide-formoterol (SYMBICORT) 160-4.5 MCG/ACT inhaler   Inhalation   Inhale 2 puffs into the lungs 2 (two) times daily.         Marland Kitchen buPROPion (WELLBUTRIN SR) 100 MG 12 hr tablet   Oral   Take 100 mg by mouth daily.          . celecoxib (CELEBREX) 100 MG capsule   Oral   Take 100 mg by mouth daily.         . clonazePAM (KLONOPIN) 0.5 MG tablet   Oral   Take 0.5 mg by mouth 2 (two) times daily.         Marland Kitchen dicyclomine (BENTYL) 10 MG capsule   Oral   Take 10 mg by mouth 4 (four) times daily -  before meals and at bedtime.         . docusate sodium (COLACE) 100 MG capsule   Oral   Take 100 mg by mouth 2 (two) times daily.         . ferrous sulfate 325 (65 FE) MG tablet   Oral   Take 325 mg by mouth daily with breakfast.         . gabapentin (NEURONTIN) 600 MG tablet   Oral   Take 600 mg by mouth at bedtime.         . insulin lispro (HUMALOG) 100 UNIT/ML injection   Subcutaneous   Inject 0-9 Units into the skin 3 (three) times daily with meals as needed for high blood sugar. Pt uses as needed per sliding scale:    150-200:  0 units  201-250:  5 units  251-300:  8 units 301-350:  9 units  Greater than 350:  Call MD         . lamoTRIgine (LAMICTAL) 100 MG tablet   Oral   Take 100 mg by mouth 2 (two) times daily.         . metFORMIN (GLUCOPHAGE) 500 MG tablet   Oral  Take 500 mg by mouth 2 (two) times daily with a meal.         . Multiple Vitamin (MULTIVITAMIN WITH MINERALS) TABS tablet   Oral   Take 1 tablet by mouth daily.         Marland Kitchen omeprazole (PRILOSEC) 40 MG capsule   Oral   Take 40 mg by mouth daily before breakfast.         . oxybutynin (DITROPAN-XL) 5 MG 24 hr tablet   Oral   Take 5 mg by mouth daily.         Marland Kitchen oxyCODONE (OXY IR/ROXICODONE) 5 MG immediate release tablet   Oral   Take 5 mg by mouth every 12 (twelve) hours as needed for severe pain.          . pregabalin (LYRICA) 50 MG capsule   Oral   Take 50 mg by mouth 3 (three) times daily.         Marland Kitchen rOPINIRole (REQUIP) 3 MG tablet   Oral   Take 3 mg by mouth at bedtime.         . senna (SENOKOT) 8.6 MG tablet   Oral   Take 1 tablet by mouth at bedtime.         . simvastatin (ZOCOR) 10 MG tablet   Oral   Take 10 mg by mouth daily.         . vitamin C (ASCORBIC ACID) 500 MG tablet   Oral   Take 500 mg by mouth 2 (two) times daily.         Marland Kitchen zolpidem (AMBIEN) 5 MG tablet   Oral   Take 5 mg by mouth at bedtime.          . famotidine (PEPCID) 20 MG tablet   Oral   Take 1 tablet (20 mg total) by mouth 2 (two) times daily.   60 tablet   0   . metoCLOPramide (REGLAN) 10 MG tablet   Oral   Take 1 tablet (10 mg total) by mouth 4 (four) times daily -  before meals and at bedtime.   60 tablet   0   . EXPIRED: potassium chloride SA (K-DUR,KLOR-CON) 20 MEQ tablet   Oral   Take 20 mEq by mouth daily.         . sucralfate (CARAFATE) 1 g tablet   Oral   Take 1 tablet (1 g total) by mouth 4 (four) times daily.   120 tablet   1      Allergies Morphine and related and Doxycycline   Family History  Problem Relation Age of Onset  . Colon cancer Neg Hx   . Esophageal cancer Neg Hx   . Rectal cancer Neg Hx   . Breast cancer Mother   . Lung cancer Mother   . Stomach cancer Father   . Diabetes      4 aunts, and 1 uncle    Social  History Social History  Substance Use Topics  . Smoking status: Former Smoker -- 1.50 packs/day for 50 years    Types: Cigarettes    Quit date: 04/06/2013  . Smokeless tobacco: Never Used  . Alcohol Use: No    Review of Systems  Constitutional:   No fever or chills.  ENT:   No sore throat. No rhinorrhea. Cardiovascular:   No chest pain. Respiratory:   No dyspnea or cough. Gastrointestinal:   Positive epigastric pain. No vomiting or diarrhea.  Genitourinary:   Increased urinary  frequency. Musculoskeletal:   Negative for focal pain 10-point ROS otherwise negative.  ____________________________________________   PHYSICAL EXAM:  VITAL SIGNS: ED Triage Vitals  Enc Vitals Group     BP 12/09/15 1232 124/55 mmHg     Pulse Rate 12/09/15 1232 92     Resp 12/09/15 1232 21     Temp 12/09/15 1232 98.4 F (36.9 C)     Temp Source 12/09/15 1232 Oral     SpO2 12/09/15 1232 94 %     Weight 12/09/15 1232 222 lb (100.699 kg)     Height 12/09/15 1232 5' 3"  (1.6 m)     Head Cir --      Peak Flow --      Pain Score 12/09/15 1234 10     Pain Loc --      Pain Edu? --      Excl. in [place]? --     Vital signs reviewed, nursing assessments reviewed.   Constitutional:   Alert and oriented. Uncomfortable. Eyes:   No scleral icterus. No conjunctival pallor. PERRL. EOMI.  No nystagmus. ENT   Head:   Normocephalic and atraumatic.   Nose:   No congestion/rhinnorhea. No septal hematoma   Mouth/Throat:   Dry mucous membranes, no pharyngeal erythema. No peritonsillar mass.    Neck:   No stridor. No SubQ emphysema. No meningismus. Hematological/Lymphatic/Immunilogical:   No cervical lymphadenopathy. Cardiovascular:   RRR. Symmetric bilateral radial and DP pulses.  No murmurs.  Respiratory:   Normal respiratory effort without tachypnea nor retractions. Breath sounds are clear and equal bilaterally. No wheezes/rales/rhonchi. Gastrointestinal:   Soft with significant epigastric and left  upper quadrant tenderness. Non distended. There is no CVA tenderness.  No rebound, rigidity, or guarding. Genitourinary:   deferred Musculoskeletal:   Nontender with normal range of motion in all extremities. No joint effusions.  No lower extremity tenderness.  No edema. Anterior chest wall tenderness over the lower sternum Neurologic:   Normal speech and language.  CN 2-10 normal. Motor grossly intact. No gross focal neurologic deficits are appreciated.  Skin:    Skin is warm, dry and intact. No rash noted.  No petechiae, purpura, or bullae.  ____________________________________________    LABS (pertinent positives/negatives) (all labs ordered are listed, but only abnormal results are displayed) Labs Reviewed  COMPREHENSIVE METABOLIC PANEL - Abnormal; Notable for the following:    Glucose, Bld 128 (*)    AST 43 (*)    Alkaline Phosphatase 176 (*)    All other components within normal limits  CBC WITH DIFFERENTIAL/PLATELET - Abnormal; Notable for the following:    Hemoglobin 11.2 (*)    HCT 34.0 (*)    RDW 21.1 (*)    Platelets 88 (*)    Lymphs Abs 0.5 (*)    All other components within normal limits  URINALYSIS COMPLETEWITH MICROSCOPIC ([facility] ONLY) - Abnormal; Notable for the following:    Color, Urine STRAW (*)    APPearance CLEAR (*)    Squamous Epithelial / LPF 0-5 (*)    All other components within normal limits  LIPASE, BLOOD  TROPONIN I  TROPONIN I   ____________________________________________   EKG  Interpreted by me Sinus rhythm rate of 88, normal axis and intervals. Normal QRS ST segments and T waves.  ____________________________________________    RADIOLOGY  CT angiogram chest abdomen pelvis unremarkable  ____________________________________________   PROCEDURES   ____________________________________________   INITIAL IMPRESSION / ASSESSMENT AND PLAN / ED COURSE  Pertinent labs & imaging results  that were available during my care of the  patient were reviewed by me and considered in my medical decision making (see chart for details).  Patient uncomfortable with upper abdominal pain. Suspect gastritis and GERD. Possible pancreatitis. We'll get labs urinalysis ET abdomen pelvis. Given IV fluids and GI cocktail for now.   ----------------------------------------- 6:03 PM on 12/09/2015 ----------------------------------------- Vital signs remained stable. Room air oxygen saturation in the low 90s, consistent with chronic baseline with emphysema. Pain is improved after GI cocktail and Toradol. CT negative, labs negative including troponin 2. Considering the patient's symptoms, medical history, and physical examination today, I have low suspicion for ACS, PE, TAD, pneumothorax, carditis, mediastinitis, pneumonia, CHF, or sepsis. Low suspicion for perforation obstruction pancreatitis cholecystitis AAA or mesenteric ischemia or other acute concerns. This is highly consistent with GERD, we'll keep her on an aggressive acid suppression regimen and have her follow up with primary care and GI.     ____________________________________________   FINAL CLINICAL IMPRESSION(S) / ED DIAGNOSES  Final diagnoses:  Upper abdominal pain  Gastroesophageal reflux disease, esophagitis presence not specified       Portions of this note were generated with dragon dictation software. Dictation errors may occur despite best attempts at proofreading.   Carrie Mew, MD 12/09/15 [phone]

## 2015-12-14 DIAGNOSIS — E119 Type 2 diabetes mellitus without complications: Secondary | ICD-10-CM | POA: Diagnosis not present

## 2015-12-14 DIAGNOSIS — L03115 Cellulitis of right lower limb: Secondary | ICD-10-CM | POA: Diagnosis not present

## 2015-12-14 DIAGNOSIS — R600 Localized edema: Secondary | ICD-10-CM | POA: Diagnosis not present

## 2015-12-15 ENCOUNTER — Inpatient Hospital Stay: Payer: Medicare Other | Admitting: Hematology and Oncology

## 2015-12-15 ENCOUNTER — Inpatient Hospital Stay: Payer: Medicare Other

## 2015-12-15 ENCOUNTER — Other Ambulatory Visit: Payer: Self-pay | Admitting: Hematology and Oncology

## 2015-12-15 DIAGNOSIS — R748 Abnormal levels of other serum enzymes: Secondary | ICD-10-CM

## 2015-12-15 DIAGNOSIS — Z79899 Other long term (current) drug therapy: Secondary | ICD-10-CM | POA: Diagnosis not present

## 2015-12-17 DIAGNOSIS — M792 Neuralgia and neuritis, unspecified: Secondary | ICD-10-CM | POA: Diagnosis not present

## 2015-12-17 DIAGNOSIS — R269 Unspecified abnormalities of gait and mobility: Secondary | ICD-10-CM | POA: Diagnosis not present

## 2015-12-17 DIAGNOSIS — M2550 Pain in unspecified joint: Secondary | ICD-10-CM | POA: Diagnosis not present

## 2015-12-17 DIAGNOSIS — E119 Type 2 diabetes mellitus without complications: Secondary | ICD-10-CM | POA: Diagnosis not present

## 2015-12-17 DIAGNOSIS — R5381 Other malaise: Secondary | ICD-10-CM | POA: Diagnosis not present

## 2015-12-21 DIAGNOSIS — K7460 Unspecified cirrhosis of liver: Secondary | ICD-10-CM | POA: Diagnosis not present

## 2015-12-21 DIAGNOSIS — R600 Localized edema: Secondary | ICD-10-CM | POA: Diagnosis not present

## 2015-12-21 DIAGNOSIS — S42201D Unspecified fracture of upper end of right humerus, subsequent encounter for fracture with routine healing: Secondary | ICD-10-CM | POA: Diagnosis not present

## 2015-12-21 DIAGNOSIS — E119 Type 2 diabetes mellitus without complications: Secondary | ICD-10-CM | POA: Diagnosis not present

## 2015-12-24 ENCOUNTER — Ambulatory Visit (INDEPENDENT_AMBULATORY_CARE_PROVIDER_SITE_OTHER): Payer: Medicare Other | Admitting: Podiatry

## 2015-12-24 ENCOUNTER — Telehealth: Payer: Self-pay | Admitting: Pulmonary Disease

## 2015-12-24 ENCOUNTER — Encounter: Payer: Self-pay | Admitting: Pulmonary Disease

## 2015-12-24 ENCOUNTER — Ambulatory Visit (INDEPENDENT_AMBULATORY_CARE_PROVIDER_SITE_OTHER): Payer: Medicare Other | Admitting: Pulmonary Disease

## 2015-12-24 ENCOUNTER — Encounter: Payer: Self-pay | Admitting: Podiatry

## 2015-12-24 VITALS — BP 118/68 | HR 61 | Ht 63.0 in | Wt 225.2 lb

## 2015-12-24 DIAGNOSIS — E1149 Type 2 diabetes mellitus with other diabetic neurological complication: Secondary | ICD-10-CM

## 2015-12-24 DIAGNOSIS — G4733 Obstructive sleep apnea (adult) (pediatric): Secondary | ICD-10-CM | POA: Diagnosis not present

## 2015-12-24 DIAGNOSIS — J45909 Unspecified asthma, uncomplicated: Secondary | ICD-10-CM | POA: Diagnosis not present

## 2015-12-24 DIAGNOSIS — M79676 Pain in unspecified toe(s): Secondary | ICD-10-CM

## 2015-12-24 DIAGNOSIS — B351 Tinea unguium: Secondary | ICD-10-CM

## 2015-12-24 DIAGNOSIS — Z9989 Dependence on other enabling machines and devices: Principal | ICD-10-CM

## 2015-12-24 MED ORDER — ALBUTEROL SULFATE HFA 108 (90 BASE) MCG/ACT IN AERS: 2.0000 | INHALATION_SPRAY | Freq: Four times a day (QID) | RESPIRATORY_TRACT | Status: DC | PRN

## 2015-12-24 NOTE — Progress Notes (Signed)
Patient ID: Stephanie [name], female   DOB: 04/01/1951, 65 y.o.   MRN: [id number]   Subjective: 65 y.o. returns the office today for painful, elongated, thickened toenails which she cannot trim herself. Denies any redness or drainage around the nails. Since last appointment she has been moved to [employer]. She also states she has cellulitis of both legs that she is currently under the active care of another doctor for. She is apparently a referral for this to she believes vascular surgery or infectious disease. No open sores. Denies any systemic complaints such as fevers, chills, nausea, vomiting.   Objective: AAO 3, NAD DP/PT pulses palpable, CRT less than 3 seconds Protective sensation decreased with Simms Weinstein monofilament Nails hypertrophic, dystrophic, elongated, brittle, discolored 10. There is tenderness overlying the nails 1-5 bilaterally. There is no surrounding erythema or drainage along the nail sites. Bilateral lower chemise have a faint amount of erythema and warmth to the anterior distal portion elects. There is no open sore identified and there is no drainage or weeping. There is no fluctuance or crepitus. No open lesions or pre-ulcerative lesions are identified. No other areas of tenderness bilateral lower extremities. No overlying edema, erythema, increased warmth. No pain with calf compression, swelling, warmth, erythema.  Assessment: Patient presents with symptomatic onychomycosis; cellulitis vs. Venous insufficiency   Plan: -Treatment options including alternatives, risks, complications were discussed -Nails sharply debrided 10 without complication/bleeding. -Continue follow-up care with the depth history her cellulitis. Also I do believe this may be venous insufficiency or vascular origin as well as recommended a vascular surgery consult. She states that she believes is aware she has been referred to but she is awaiting the referral. If symptoms worsen to the  ER. -Follow-up with me in 3 months or sooner if any issues are to arise.  Celesta Gentile, DPM

## 2015-12-24 NOTE — Progress Notes (Signed)
Current Outpatient Prescriptions on File Prior to Visit  Medication Sig  . acidophilus (RISAQUAD) CAPS capsule Take 1 capsule by mouth daily.  . ARIPiprazole (ABILIFY) 30 MG tablet Take 30 mg by mouth daily.  . budesonide-formoterol (SYMBICORT) 160-4.5 MCG/ACT inhaler Inhale 2 puffs into the lungs 2 (two) times daily.  Marland Kitchen buPROPion (WELLBUTRIN SR) 100 MG 12 hr tablet Take 100 mg by mouth daily.   . celecoxib (CELEBREX) 100 MG capsule Take 100 mg by mouth daily.  . clonazePAM (KLONOPIN) 0.5 MG tablet Take 0.5 mg by mouth 2 (two) times daily.  Marland Kitchen dicyclomine (BENTYL) 10 MG capsule Take 10 mg by mouth 4 (four) times daily -  before meals and at bedtime.  . docusate sodium (COLACE) 100 MG capsule Take 100 mg by mouth 2 (two) times daily.  . ferrous sulfate 325 (65 FE) MG tablet Take 325 mg by mouth daily with breakfast.  . gabapentin (NEURONTIN) 600 MG tablet Take 600 mg by mouth at bedtime.  . insulin lispro (HUMALOG) 100 UNIT/ML injection Inject 0-9 Units into the skin 3 (three) times daily with meals as needed for high blood sugar. Pt uses as needed per sliding scale:    150-200:  0 units  201-250:  5 units  251-300:  8 units 301-350:  9 units  Greater than 350:  Call MD  . lamoTRIgine (LAMICTAL) 100 MG tablet Take 100 mg by mouth 2 (two) times daily.  . metFORMIN (GLUCOPHAGE) 500 MG tablet Take 500 mg by mouth 2 (two) times daily with a meal.  . metoCLOPramide (REGLAN) 10 MG tablet Take 1 tablet (10 mg total) by mouth 4 (four) times daily -  before meals and at bedtime.  . Multiple Vitamin (MULTIVITAMIN WITH MINERALS) TABS tablet Take 1 tablet by mouth daily.  Marland Kitchen omeprazole (PRILOSEC) 40 MG capsule Take 40 mg by mouth daily before breakfast.  . oxybutynin (DITROPAN-XL) 5 MG 24 hr tablet Take 5 mg by mouth daily.  Marland Kitchen oxyCODONE (OXY IR/ROXICODONE) 5 MG immediate release tablet Take 5 mg by mouth every 12 (twelve) hours as needed for severe pain.   . potassium chloride SA (K-DUR,KLOR-CON) 20 MEQ  tablet Take 20 mEq by mouth daily.  . pregabalin (LYRICA) 50 MG capsule Take 50 mg by mouth 3 (three) times daily.  Marland Kitchen rOPINIRole (REQUIP) 3 MG tablet Take 3 mg by mouth at bedtime.  . senna (SENOKOT) 8.6 MG tablet Take 1 tablet by mouth at bedtime.  . simvastatin (ZOCOR) 10 MG tablet Take 10 mg by mouth daily.  . sucralfate (CARAFATE) 1 g tablet Take 1 tablet (1 g total) by mouth 4 (four) times daily.  . vitamin C (ASCORBIC ACID) 500 MG tablet Take 500 mg by mouth 2 (two) times daily.  Marland Kitchen zolpidem (AMBIEN) 5 MG tablet Take 5 mg by mouth at bedtime.    No current facility-administered medications on file prior to visit.    Chief Complaint  Patient presents with  . Follow-up    Wears CPAP most nights. Denies any issues with mask. States that she wears the CPAP about everyother night d/t pressure feeling too high. DME: [facility]     Sleep tests PSG 08/13/13 >> REM AHI 27.9, SaO2 low 82%, Spent 15.1 min with SpO2 < 90%. CPAP 09/06/13 >> CPAP 8 >> AHI 0, + R, + S. PSG 10/18/13 >> AHI 6.9, SaO2 low 82%. Spent 139.2 min with SaO2 < 89%. ONO with CPAP and RA 11/11/13 >> Test time 8 hrs 59 min.  Mean SpO2 92%, low SpO2 81%. Spent 9 min with SpO2 < 88% Ferritin 12/13/13 >> 55.9; started ferrous sulfate CPAP 11/23/15 to 12/22/15 >> used on 9 of 30 nights with average 4 hrs 36 min.  Average AHI 7.5 with CPAP 8 cm H2O  Pulmonary tests PFT 02/07/14 >> FEV1 1.94 (66%), FEV1% 91, TLC 3.62 (77%), DLCO 75%, +BD  Cardiac tests Echo 11/14/13 >> EF 55 to 60%  Past medical history Heb B, HLD, Bipolar, Anxiety, Depression, CVA, GERD, Cirrhosis, Type 1 aortic dissection, DM, thrombocytopenia, RLS  Past surgical history, Family history, Social history, Allergies reviewed.  Vital signs BP 118/68 mmHg  Pulse 61  Ht 5' 3"  (1.6 m)  Wt 225 lb 3.2 oz (102.15 kg)  BMI 39.90 kg/m2  SpO2 93%  History of Present Illness: Stephanie [name] is a 65 y.o. female former smoker with OSA, dyspnea with asthma and  deconditioning.  She had several CT chest over the past 6 months >> she was having chest pain.  These showed RUL ground glass area, but was improving on most recent CT chest.   She gets winded and chest discomfort in hot/humid weather.  She will also get cough with clear sputum.  She uses symbicort and this helps.  She does not have a rescue inhaler.  She tries using CPAP.  She feels it helps initially, but then feels like she is getting smothered.  She is not sure if the pressure is too high.  She has full face mask.  Physical Exam:  General - No distress ENT - No sinus tenderness, no oral exudate, no LAN, MP 4, wears dentures Cardiac - s1s2 regular, no murmur Chest - No wheeze/rales/dullness Back - No focal tenderness Abd - Soft, non-tender Ext - No edema Neuro - Normal strength Skin - No rashes Psych - normal mood, and behavior   Assessment/Plan:  Asthma. - continue symbicort >> discussed proper role of her inhalers - add prn ventolin  Obstructive sleep apnea. - continue CPAP 8 cm H2O for now - change to nasal pillows/nasal mask >> I am concerned her current difficulty with CPAP use might be related to relative increase in oral pressure with full face mask causing pharyngeal collapse  Chest discomfort. - recent CT chest was negative for progression of aortic disease - might be related to asthma symptoms >> will see how she responds to addition of rescue inhaler   Patient Instructions  Ventolin two puffs every 4 to 6 hours as needed for cough, wheeze, or chest congestion  Will arrange for new CPAP mask   Call if you are still having trouble using CPAP after mask change  Follow up in 6 months     Chesley Mires, MD [place] [facility]/Critical [facility]/Sleep Pager:  [phone] 12/24/2015, 10:26 AM

## 2015-12-24 NOTE — Patient Instructions (Signed)
Ventolin two puffs every 4 to 6 hours as needed for cough, wheeze, or chest congestion  Will arrange for new CPAP mask   Call if you are still having trouble using CPAP after mask change  Follow up in 6 months

## 2015-12-24 NOTE — Telephone Encounter (Signed)
[facility] [place]. Was placed on hold for several minutes. WCB

## 2015-12-25 NOTE — Telephone Encounter (Signed)
Called spoke with [employer]. She states that the information was already received and nothing further was needed. She voiced understanding and had no further questions.

## 2015-12-27 DIAGNOSIS — Z79899 Other long term (current) drug therapy: Secondary | ICD-10-CM | POA: Diagnosis not present

## 2015-12-28 DIAGNOSIS — E119 Type 2 diabetes mellitus without complications: Secondary | ICD-10-CM | POA: Diagnosis not present

## 2015-12-28 DIAGNOSIS — R601 Generalized edema: Secondary | ICD-10-CM | POA: Diagnosis not present

## 2015-12-28 DIAGNOSIS — K7460 Unspecified cirrhosis of liver: Secondary | ICD-10-CM | POA: Diagnosis not present

## 2015-12-28 DIAGNOSIS — R300 Dysuria: Secondary | ICD-10-CM | POA: Diagnosis not present

## 2015-12-31 DIAGNOSIS — G894 Chronic pain syndrome: Secondary | ICD-10-CM | POA: Diagnosis not present

## 2015-12-31 DIAGNOSIS — R54 Age-related physical debility: Secondary | ICD-10-CM | POA: Diagnosis not present

## 2015-12-31 DIAGNOSIS — K5900 Constipation, unspecified: Secondary | ICD-10-CM | POA: Diagnosis not present

## 2015-12-31 DIAGNOSIS — I10 Essential (primary) hypertension: Secondary | ICD-10-CM | POA: Diagnosis not present

## 2015-12-31 IMAGING — CR DG ABDOMEN ACUTE W/ 1V CHEST
4 series · 4 of 4 positions shown · non-contrast
Comparison: Chest radiograph February 19, 2015; CT abdomen and
pelvis June 13, 2014

CLINICAL DATA: Two week history of diarrhea. Currently with melena.
Nausea and dizziness.

EXAM:
DG ABDOMEN ACUTE W/ 1V CHEST

[chest pa]
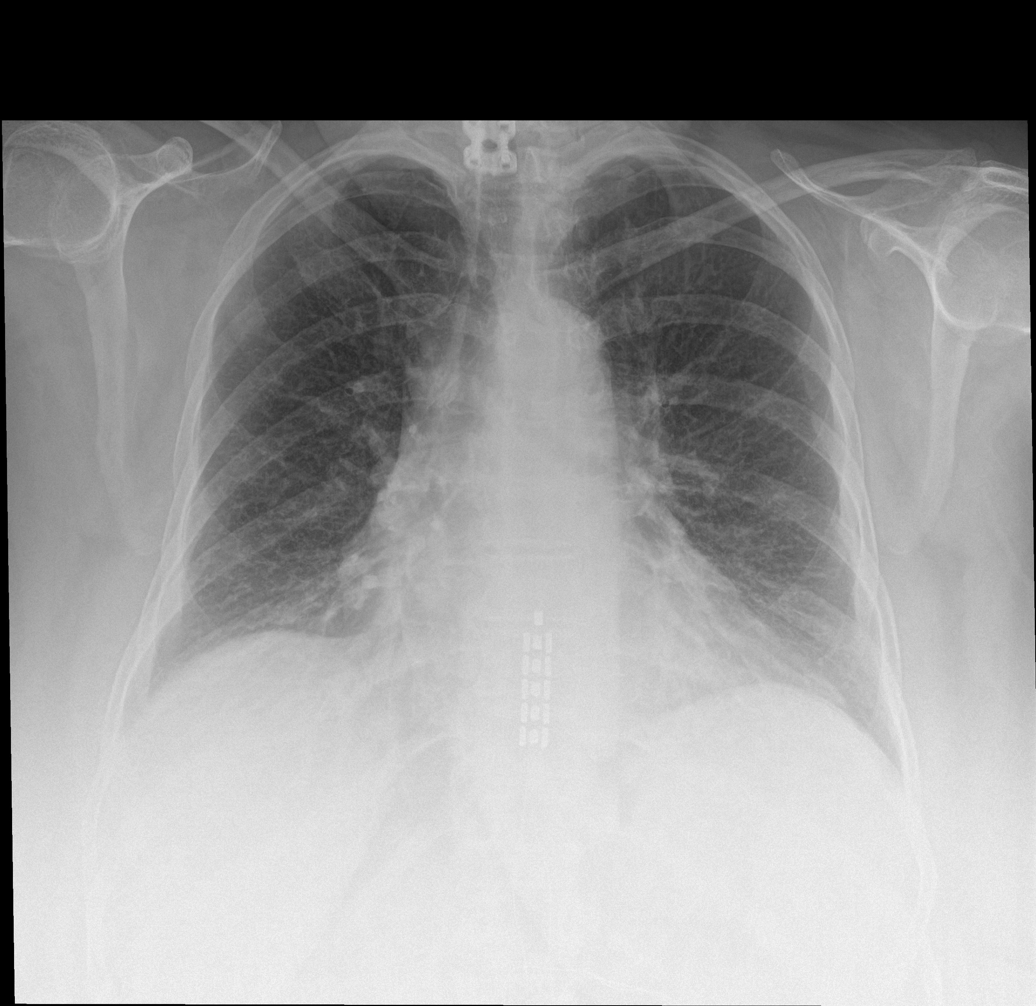

[abdomen erect]
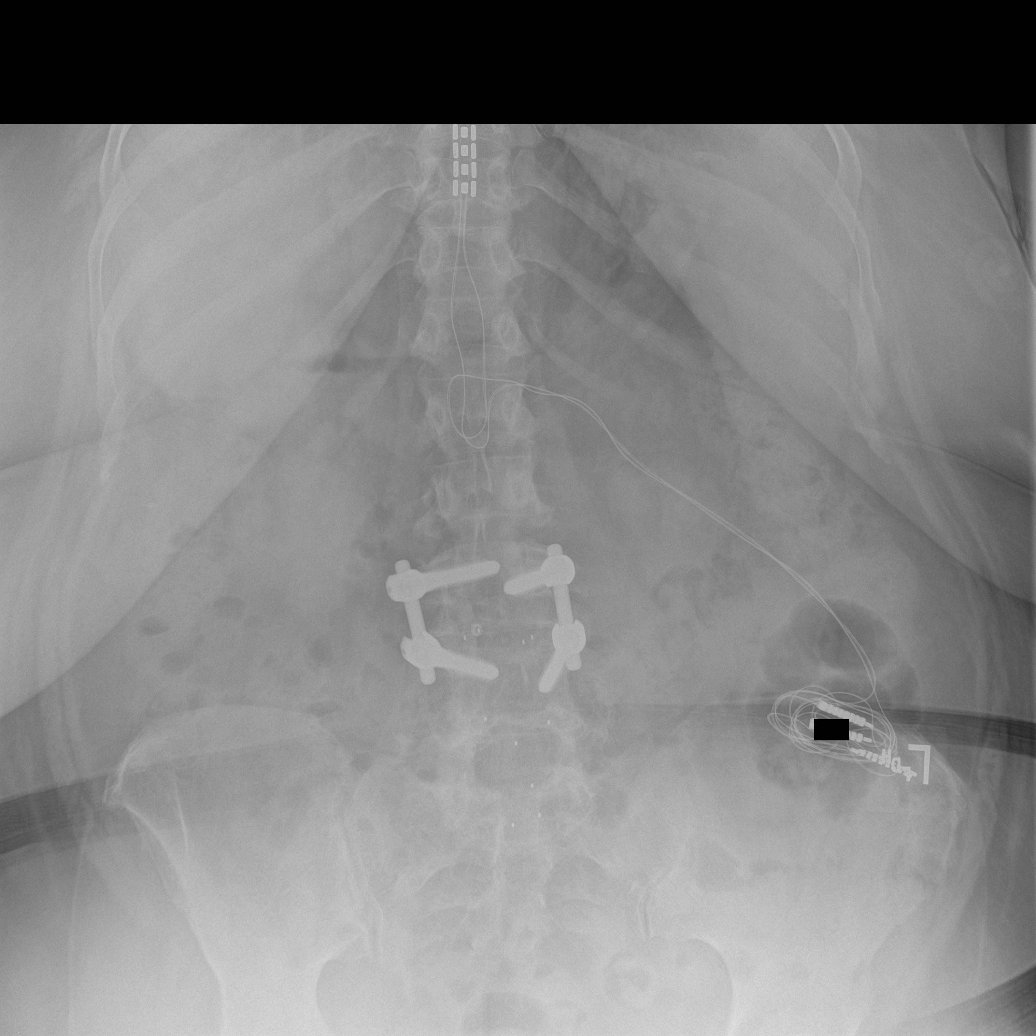

[abdomen supine (1 of 2)]
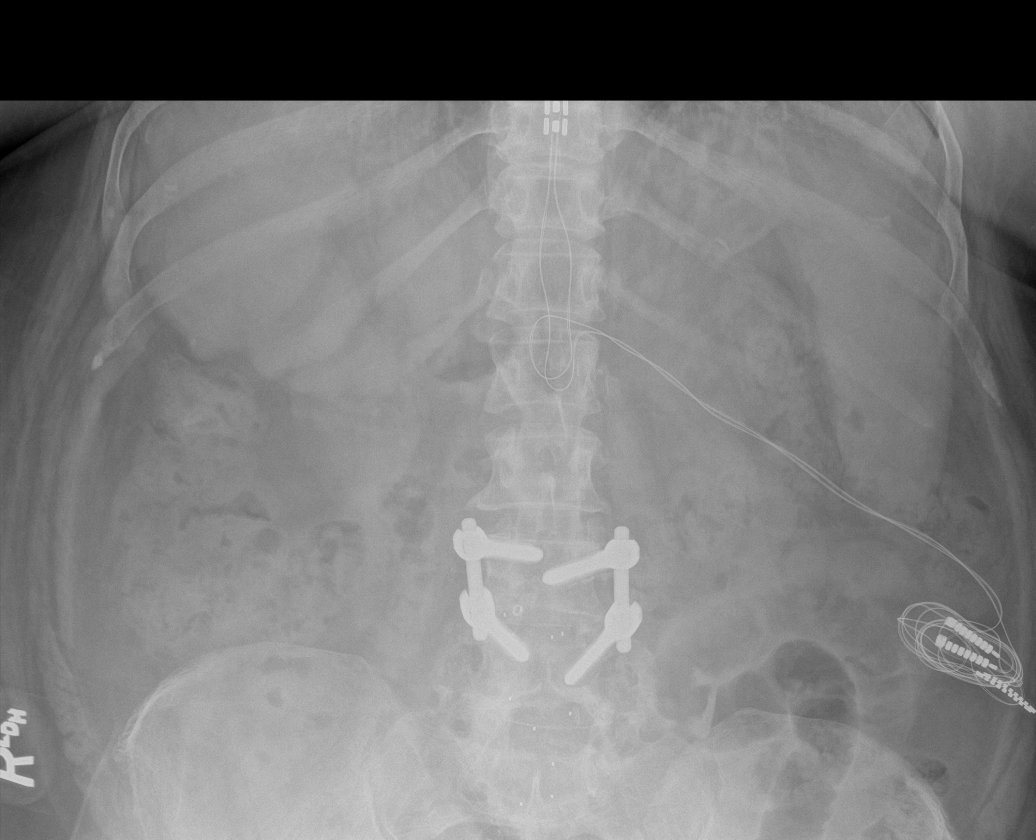

[abdomen supine (2 of 2)]
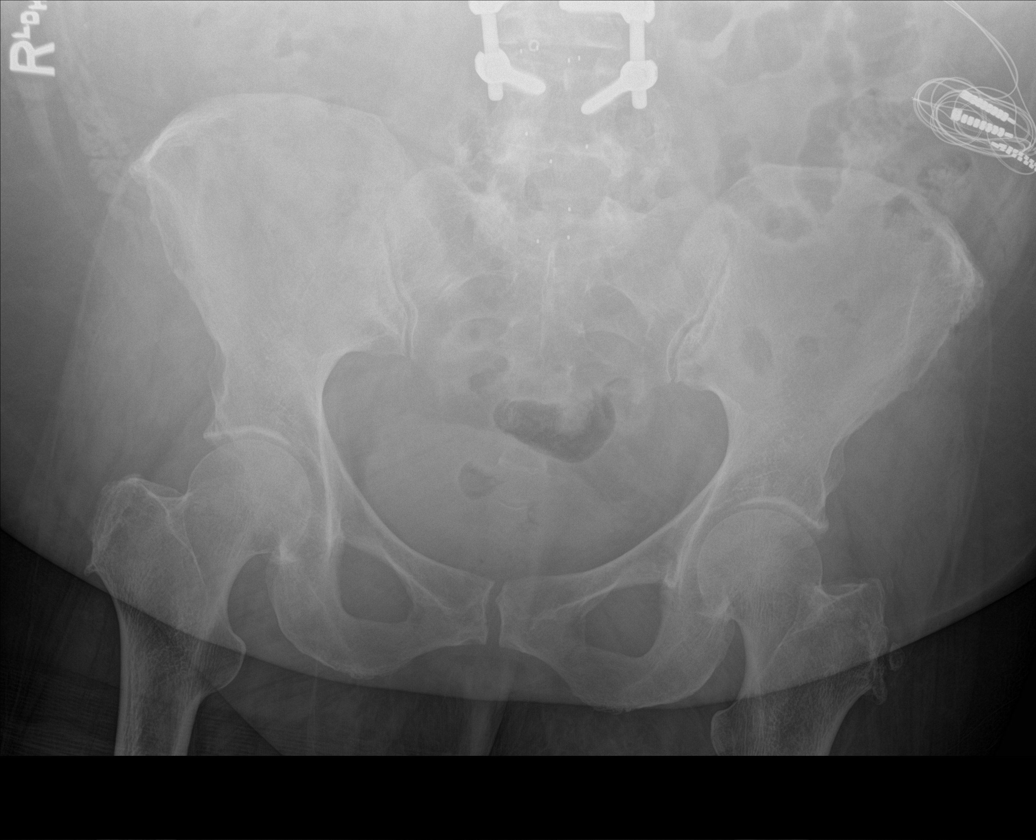

[4 of 4 positions shown; findings below may reference images not displayed]

FINDINGS: PA chest: No edema or consolidation. Heart size and pulmonary
vascularity are normal. No adenopathy. Thoracic stimulator tips are
in the lower thoracic region. There is postoperative change in the
lower cervical spine region.

Supine and upright abdomen: There is moderate stool in the colon.
There is no bowel dilatation or air-fluid level suggesting
obstruction. No free air. There is postoperative change in the
lumbar spine.
IMPRESSION: Moderate stool in colon. Bowel gas pattern unremarkable without
obstruction or free air. No lung edema or consolidation.

## 2015-12-31 IMAGING — CT CT ABD-PELV W/ CM
1 of 3 series · 14 of 32 positions shown, 19 images · IV contrast (omnipaque)
Comparison: 07/01/2012

CLINICAL DATA: Pain across the abdomen. Melena. History of bleeding
ulcer.

EXAM:
CT ABDOMEN AND PELVIS WITH CONTRAST
TECHNIQUE: Multidetector CT imaging of the abdomen and pelvis was performed
using the standard protocol following bolus administration of
intravenous contrast.
CONTRAST:  100mL OMNIPAQUE IOHEXOL 300 MG/ML  SOLN

[Series 3: routine abd pel with · axial · 0.96mm/px · z∈[-520,-100]mm · 14 of 96 slices shown, 19 images]
[im 6/96  soft-tissue]
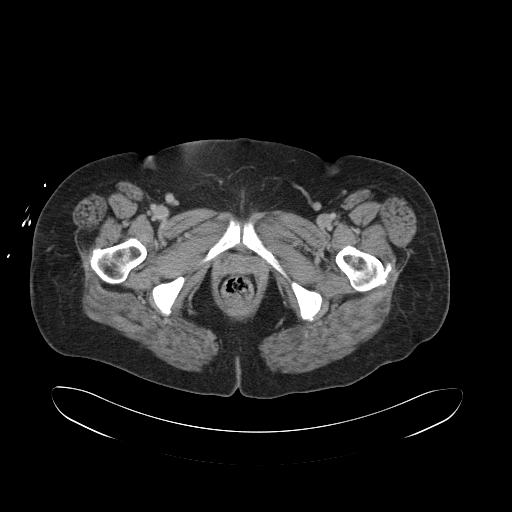
[im 6/96  bone]
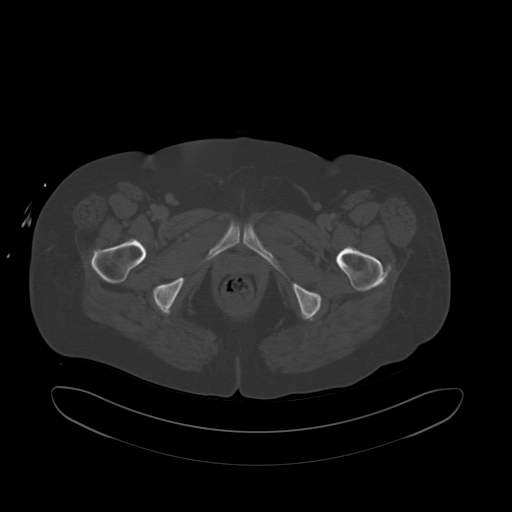
[im 12/96  soft-tissue]
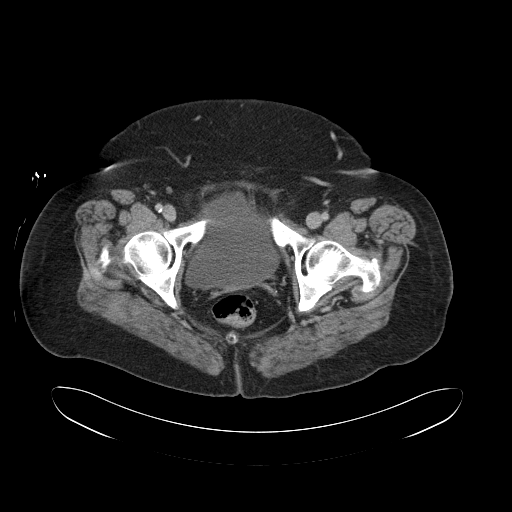
[im 23/96  soft-tissue]
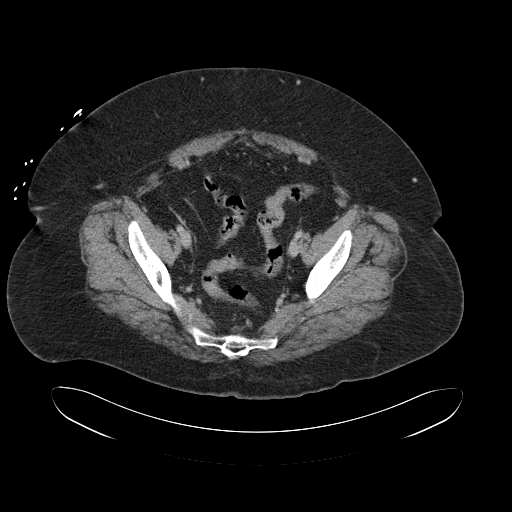
[im 28/96  soft-tissue]
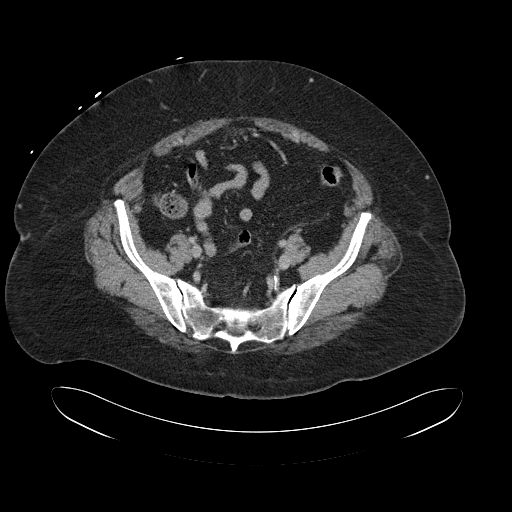
[im 34/96  soft-tissue]
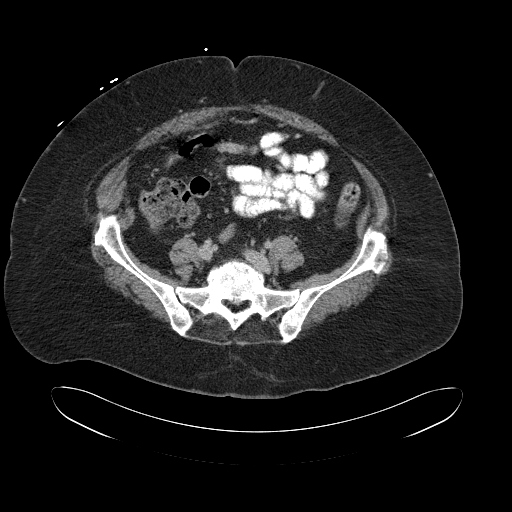
[im 40/96  soft-tissue]
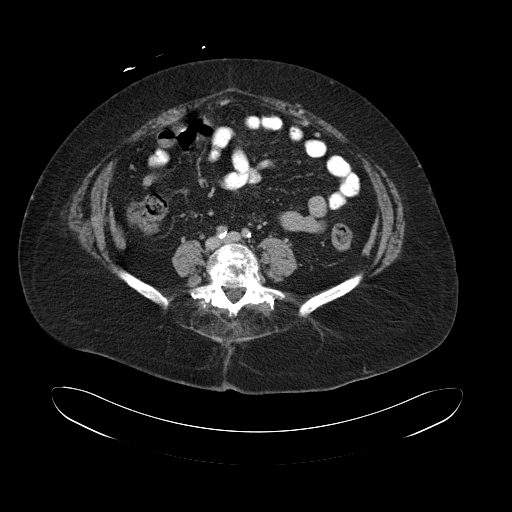
[im 51/96  soft-tissue]
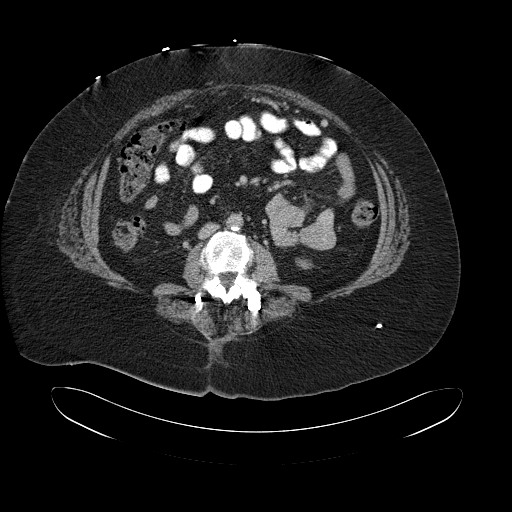
[im 56/96  soft-tissue]
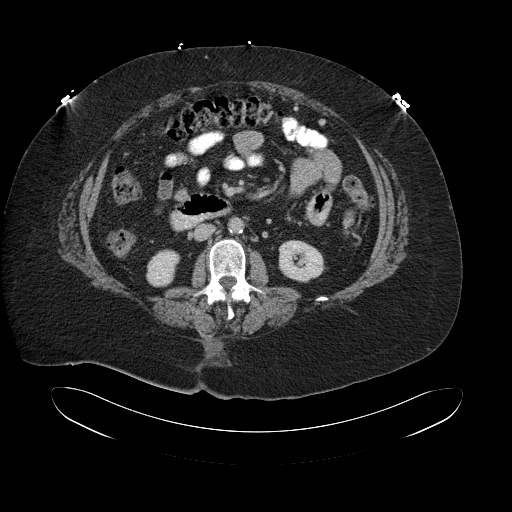
[im 62/96  soft-tissue]
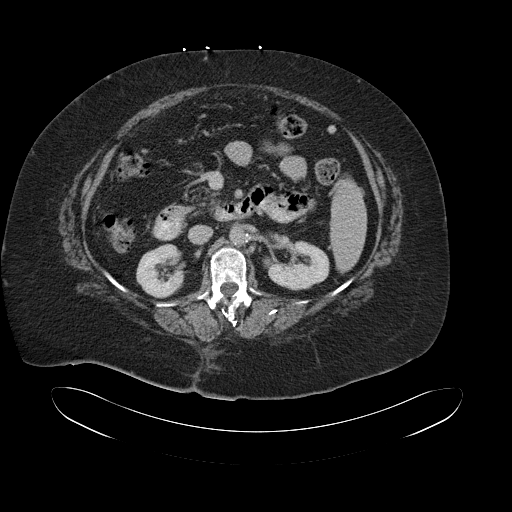
[im 62/96  bone]
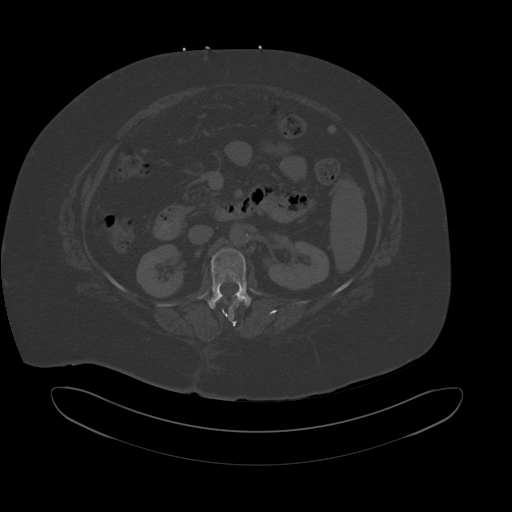
[im 68/96  soft-tissue]
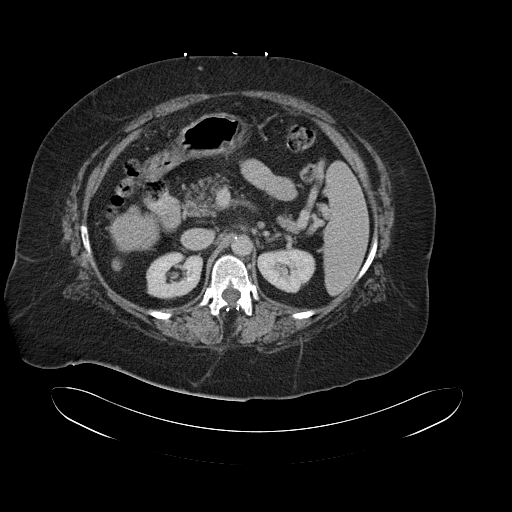
[im 73/96  soft-tissue]
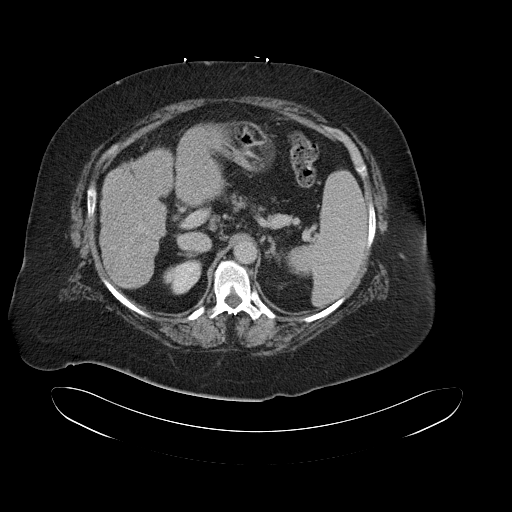
[im 73/96  lung]
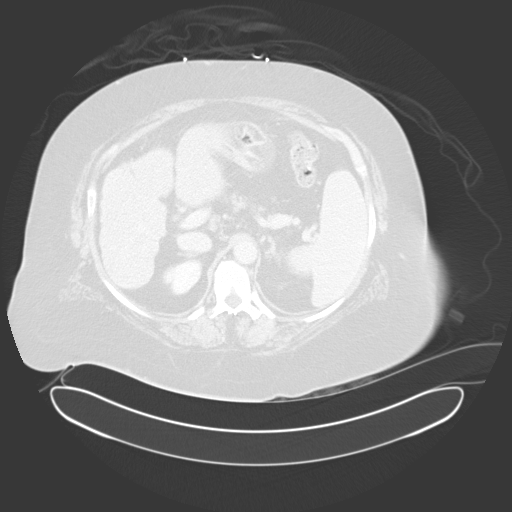
[im 79/96  lung]
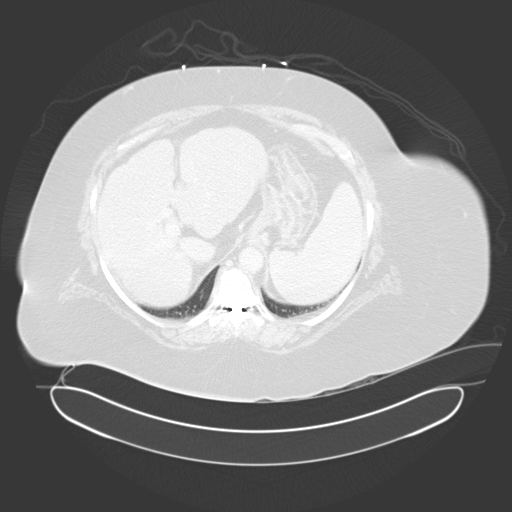
[im 84/96  soft-tissue]
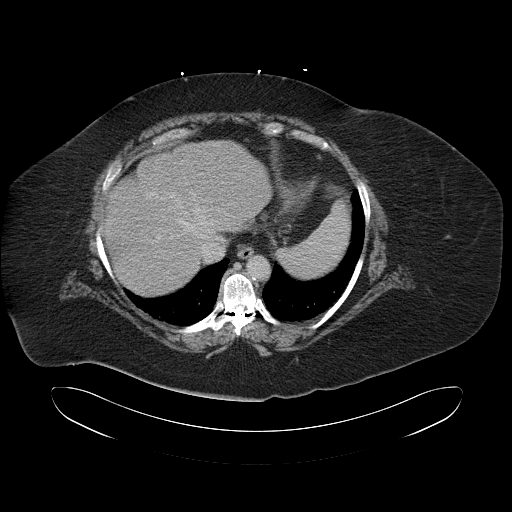
[im 84/96  lung]
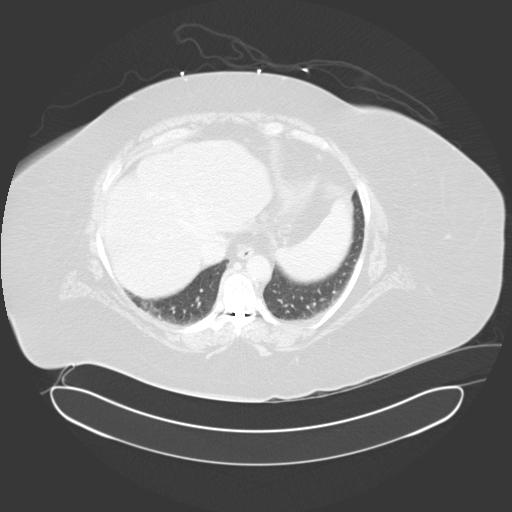
[im 90/96  soft-tissue]
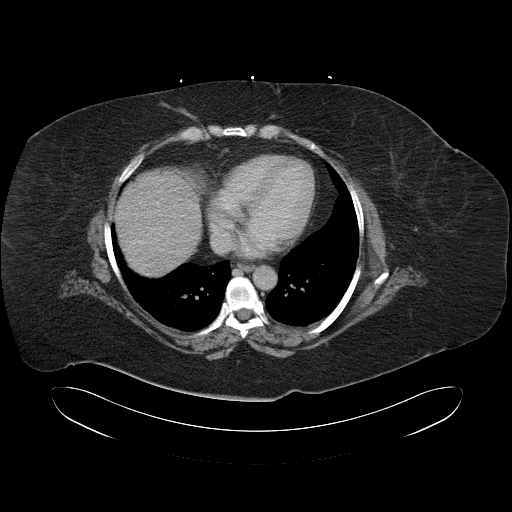
[im 90/96  lung]
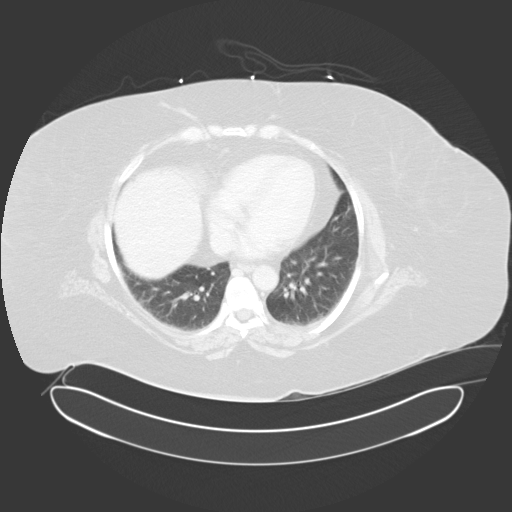

[14 of 32 positions shown; findings below may reference images not displayed]

FINDINGS: Lower chest and abdominal wall:  No contributory findings.

Hepatobiliary: Cirrhotic liver morphology with macroscopic fibrosis.
Patient has history of cirrhosis from nonalcoholic steatohepatitis.
An exophytic nodule from the left lobe liver is stable from
8679.Cholecystectomy. Normal common bile duct diameter.

Pancreas: Fatty atrophy without acute finding.

Spleen: Mildly enlarged in the setting of cirrhosis.
Splenomesenteric shunt noted, accounting for proliferation of
omental vessels with linear reticulated appearance.

Adrenals/Urinary Tract: Negative adrenals. No hydronephrosis or
stone. Unremarkable bladder.

Reproductive:Hysterectomy with negative adnexa. Pelvic floor laxity.

Stomach/Bowel: No visible ulcer, gastritis, or perforation.
Submucosal lipomatous change to the proximal colon and stomach. No
active inflammation or bowel obstruction. Appendix not identified.
No pericecal inflammatory change.

Vascular/Lymphatic: No acute vascular abnormality. No mass or
adenopathy.

Peritoneal: No ascites or pneumoperitoneum.

Musculoskeletal: L3-4, L4-5, and L5-S1 discectomy. There is doubtful
bony fusion at L3-4 but no evident failure of the associated rod and
pedicle screws. Dorsal column stimulator at the lower thoracic
level. No acute osseous finding.
IMPRESSION: 1. No acute finding.
2. Cirrhosis and portal hypertension.

## 2016-01-04 DIAGNOSIS — R600 Localized edema: Secondary | ICD-10-CM | POA: Diagnosis not present

## 2016-01-04 DIAGNOSIS — E119 Type 2 diabetes mellitus without complications: Secondary | ICD-10-CM | POA: Diagnosis not present

## 2016-01-04 DIAGNOSIS — K7581 Nonalcoholic steatohepatitis (NASH): Secondary | ICD-10-CM | POA: Diagnosis not present

## 2016-01-04 DIAGNOSIS — L03115 Cellulitis of right lower limb: Secondary | ICD-10-CM | POA: Diagnosis not present

## 2016-01-07 DIAGNOSIS — E119 Type 2 diabetes mellitus without complications: Secondary | ICD-10-CM | POA: Diagnosis not present

## 2016-01-07 DIAGNOSIS — G629 Polyneuropathy, unspecified: Secondary | ICD-10-CM | POA: Diagnosis not present

## 2016-01-07 DIAGNOSIS — I10 Essential (primary) hypertension: Secondary | ICD-10-CM | POA: Diagnosis not present

## 2016-01-07 DIAGNOSIS — L0390 Cellulitis, unspecified: Secondary | ICD-10-CM | POA: Diagnosis not present

## 2016-01-12 ENCOUNTER — Ambulatory Visit: Payer: Medicare Other | Admitting: Hematology and Oncology

## 2016-01-12 ENCOUNTER — Other Ambulatory Visit: Payer: Medicare Other

## 2016-01-12 DIAGNOSIS — R600 Localized edema: Secondary | ICD-10-CM | POA: Diagnosis not present

## 2016-01-12 DIAGNOSIS — I6523 Occlusion and stenosis of bilateral carotid arteries: Secondary | ICD-10-CM | POA: Insufficient documentation

## 2016-01-12 DIAGNOSIS — R0602 Shortness of breath: Secondary | ICD-10-CM | POA: Diagnosis not present

## 2016-01-12 DIAGNOSIS — E782 Mixed hyperlipidemia: Secondary | ICD-10-CM | POA: Diagnosis not present

## 2016-01-14 DIAGNOSIS — I10 Essential (primary) hypertension: Secondary | ICD-10-CM | POA: Diagnosis not present

## 2016-01-14 DIAGNOSIS — R197 Diarrhea, unspecified: Secondary | ICD-10-CM | POA: Diagnosis not present

## 2016-01-14 DIAGNOSIS — E119 Type 2 diabetes mellitus without complications: Secondary | ICD-10-CM | POA: Diagnosis not present

## 2016-01-14 DIAGNOSIS — R609 Edema, unspecified: Secondary | ICD-10-CM | POA: Diagnosis not present

## 2016-01-18 DIAGNOSIS — R197 Diarrhea, unspecified: Secondary | ICD-10-CM | POA: Diagnosis not present

## 2016-01-18 DIAGNOSIS — M25551 Pain in right hip: Secondary | ICD-10-CM | POA: Diagnosis not present

## 2016-01-18 DIAGNOSIS — E119 Type 2 diabetes mellitus without complications: Secondary | ICD-10-CM | POA: Diagnosis not present

## 2016-01-18 DIAGNOSIS — R600 Localized edema: Secondary | ICD-10-CM | POA: Diagnosis not present

## 2016-01-21 DIAGNOSIS — I10 Essential (primary) hypertension: Secondary | ICD-10-CM | POA: Diagnosis not present

## 2016-01-21 DIAGNOSIS — R609 Edema, unspecified: Secondary | ICD-10-CM | POA: Diagnosis not present

## 2016-01-21 DIAGNOSIS — E1140 Type 2 diabetes mellitus with diabetic neuropathy, unspecified: Secondary | ICD-10-CM | POA: Diagnosis not present

## 2016-01-25 DIAGNOSIS — M79621 Pain in right upper arm: Secondary | ICD-10-CM | POA: Diagnosis not present

## 2016-01-25 DIAGNOSIS — M25551 Pain in right hip: Secondary | ICD-10-CM | POA: Diagnosis not present

## 2016-01-25 DIAGNOSIS — M79601 Pain in right arm: Secondary | ICD-10-CM | POA: Diagnosis not present

## 2016-01-25 DIAGNOSIS — M545 Low back pain: Secondary | ICD-10-CM | POA: Diagnosis not present

## 2016-01-25 DIAGNOSIS — M79631 Pain in right forearm: Secondary | ICD-10-CM | POA: Diagnosis not present

## 2016-01-25 DIAGNOSIS — M25561 Pain in right knee: Secondary | ICD-10-CM | POA: Diagnosis not present

## 2016-01-26 DIAGNOSIS — Z79899 Other long term (current) drug therapy: Secondary | ICD-10-CM | POA: Diagnosis not present

## 2016-01-26 DIAGNOSIS — J449 Chronic obstructive pulmonary disease, unspecified: Secondary | ICD-10-CM | POA: Diagnosis not present

## 2016-01-26 DIAGNOSIS — R54 Age-related physical debility: Secondary | ICD-10-CM | POA: Diagnosis not present

## 2016-01-26 DIAGNOSIS — I10 Essential (primary) hypertension: Secondary | ICD-10-CM | POA: Diagnosis not present

## 2016-01-28 DIAGNOSIS — I10 Essential (primary) hypertension: Secondary | ICD-10-CM | POA: Diagnosis not present

## 2016-01-28 DIAGNOSIS — M25511 Pain in right shoulder: Secondary | ICD-10-CM | POA: Diagnosis not present

## 2016-01-28 DIAGNOSIS — M25561 Pain in right knee: Secondary | ICD-10-CM | POA: Diagnosis not present

## 2016-01-29 ENCOUNTER — Inpatient Hospital Stay: Payer: Medicare Other | Attending: Hematology and Oncology

## 2016-01-29 ENCOUNTER — Telehealth: Payer: Self-pay | Admitting: *Deleted

## 2016-01-29 ENCOUNTER — Inpatient Hospital Stay (HOSPITAL_BASED_OUTPATIENT_CLINIC_OR_DEPARTMENT_OTHER): Payer: Medicare Other | Admitting: Hematology and Oncology

## 2016-01-29 VITALS — BP 105/72 | HR 64 | Temp 97.2°F | Resp 18 | Wt 229.7 lb

## 2016-01-29 DIAGNOSIS — Z86010 Personal history of colonic polyps: Secondary | ICD-10-CM | POA: Diagnosis not present

## 2016-01-29 DIAGNOSIS — G4730 Sleep apnea, unspecified: Secondary | ICD-10-CM | POA: Diagnosis not present

## 2016-01-29 DIAGNOSIS — I7100 Dissection of unspecified site of aorta: Secondary | ICD-10-CM | POA: Insufficient documentation

## 2016-01-29 DIAGNOSIS — M129 Arthropathy, unspecified: Secondary | ICD-10-CM

## 2016-01-29 DIAGNOSIS — M7989 Other specified soft tissue disorders: Secondary | ICD-10-CM | POA: Diagnosis not present

## 2016-01-29 DIAGNOSIS — D696 Thrombocytopenia, unspecified: Secondary | ICD-10-CM | POA: Diagnosis not present

## 2016-01-29 DIAGNOSIS — Z79899 Other long term (current) drug therapy: Secondary | ICD-10-CM

## 2016-01-29 DIAGNOSIS — F419 Anxiety disorder, unspecified: Secondary | ICD-10-CM | POA: Insufficient documentation

## 2016-01-29 DIAGNOSIS — Z803 Family history of malignant neoplasm of breast: Secondary | ICD-10-CM

## 2016-01-29 DIAGNOSIS — G2581 Restless legs syndrome: Secondary | ICD-10-CM

## 2016-01-29 DIAGNOSIS — Z8711 Personal history of peptic ulcer disease: Secondary | ICD-10-CM | POA: Insufficient documentation

## 2016-01-29 DIAGNOSIS — R5383 Other fatigue: Secondary | ICD-10-CM | POA: Insufficient documentation

## 2016-01-29 DIAGNOSIS — Z794 Long term (current) use of insulin: Secondary | ICD-10-CM

## 2016-01-29 DIAGNOSIS — R109 Unspecified abdominal pain: Secondary | ICD-10-CM | POA: Diagnosis not present

## 2016-01-29 DIAGNOSIS — R32 Unspecified urinary incontinence: Secondary | ICD-10-CM | POA: Diagnosis not present

## 2016-01-29 DIAGNOSIS — K219 Gastro-esophageal reflux disease without esophagitis: Secondary | ICD-10-CM | POA: Diagnosis not present

## 2016-01-29 DIAGNOSIS — J45909 Unspecified asthma, uncomplicated: Secondary | ICD-10-CM

## 2016-01-29 DIAGNOSIS — D509 Iron deficiency anemia, unspecified: Secondary | ICD-10-CM

## 2016-01-29 DIAGNOSIS — E785 Hyperlipidemia, unspecified: Secondary | ICD-10-CM | POA: Diagnosis not present

## 2016-01-29 DIAGNOSIS — K7581 Nonalcoholic steatohepatitis (NASH): Secondary | ICD-10-CM | POA: Insufficient documentation

## 2016-01-29 DIAGNOSIS — Z7984 Long term (current) use of oral hypoglycemic drugs: Secondary | ICD-10-CM

## 2016-01-29 DIAGNOSIS — E1165 Type 2 diabetes mellitus with hyperglycemia: Secondary | ICD-10-CM

## 2016-01-29 DIAGNOSIS — Z8673 Personal history of transient ischemic attack (TIA), and cerebral infarction without residual deficits: Secondary | ICD-10-CM | POA: Insufficient documentation

## 2016-01-29 DIAGNOSIS — Z800 Family history of malignant neoplasm of digestive organs: Secondary | ICD-10-CM | POA: Insufficient documentation

## 2016-01-29 DIAGNOSIS — F319 Bipolar disorder, unspecified: Secondary | ICD-10-CM

## 2016-01-29 DIAGNOSIS — R21 Rash and other nonspecific skin eruption: Secondary | ICD-10-CM

## 2016-01-29 DIAGNOSIS — K7460 Unspecified cirrhosis of liver: Secondary | ICD-10-CM | POA: Insufficient documentation

## 2016-01-29 DIAGNOSIS — Z801 Family history of malignant neoplasm of trachea, bronchus and lung: Secondary | ICD-10-CM | POA: Insufficient documentation

## 2016-01-29 DIAGNOSIS — L0390 Cellulitis, unspecified: Secondary | ICD-10-CM | POA: Diagnosis not present

## 2016-01-29 DIAGNOSIS — Z87891 Personal history of nicotine dependence: Secondary | ICD-10-CM | POA: Insufficient documentation

## 2016-01-29 DIAGNOSIS — D649 Anemia, unspecified: Secondary | ICD-10-CM

## 2016-01-29 DIAGNOSIS — F329 Major depressive disorder, single episode, unspecified: Secondary | ICD-10-CM | POA: Diagnosis not present

## 2016-01-29 LAB — FERRITIN: Ferritin: 46 ng/mL (ref 11–307)

## 2016-01-29 LAB — CBC WITH DIFFERENTIAL/PLATELET
Basophils Absolute: 0 10*3/uL (ref 0–0.1)
Basophils Relative: 1 %
Eosinophils Absolute: 0.1 10*3/uL (ref 0–0.7)
Eosinophils Relative: 2 %
HCT: 34.2 % — ABNORMAL LOW (ref 35.0–47.0)
Hemoglobin: 12 g/dL (ref 12.0–16.0)
Lymphocytes Relative: 14 %
Lymphs Abs: 0.8 10*3/uL — ABNORMAL LOW (ref 1.0–3.6)
MCH: 30.8 pg (ref 26.0–34.0)
MCHC: 35 g/dL (ref 32.0–36.0)
MCV: 88 fL (ref 80.0–100.0)
Monocytes Absolute: 0.5 10*3/uL (ref 0.2–0.9)
Monocytes Relative: 9 %
Neutro Abs: 4.4 10*3/uL (ref 1.4–6.5)
Neutrophils Relative %: 74 %
Platelets: 98 10*3/uL — ABNORMAL LOW (ref 150–440)
RBC: 3.88 MIL/uL (ref 3.80–5.20)
RDW: 19.1 % — ABNORMAL HIGH (ref 11.5–14.5)
WBC: 5.9 10*3/uL (ref 3.6–11.0)

## 2016-01-29 NOTE — Progress Notes (Signed)
[facility] day: 01/29/2016  Chief Complaint: Stephanie [name] is a 65 y.o. female with chronic thrombocytopenia and iron deficiency anemia who is seen for 2 month assessment.  HPI: The patient was last seen in the medical oncology clinic on 11/13/2015.  At that time, she was fatigued.  She had recurrent ice pica.  Hematocrit had drifted down (37 to 29.7).  Platelet count was stable (111,000).  Ferritin was 15 (low).  B12 and folate were normal.  She was contacted to begin ferrous sulfate 325 mg po q day with OJ or vitamin C and advance as tolerated to 1 pill BID.  Stool guaiacs were requested and negative x 3.  She was seen in the ER on 12/09/2015 with epigastric pain felt secondary to reflux.  She was placed on a PPI.  She feels better.  During the interim, she notes low energy.  She has had issues with her diabetes. Her right leg is still swollen.  She has had cellulitis. The physician at [facility] is managing this problem. She states that she still has a fungal rash which she is using a powder. Diet is good. She is taking oral iron once a day. Stools are black secondary to iron.   Past Medical History:  Diagnosis Date  . Adenomatous colon polyp 08/27/2014   Polyps x 3  . Anemia   . Anxiety   . 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 Arthritis   . Asthma   . Bipolar affective disorder ([place])    h/o  . Blood transfusion 2000  . Blood transfusion without reported diagnosis   . Chronic abdominal pain    abdominal wall pain  . 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 Colon polyp   . Depression   . Diabetes mellitus without complication ([place])   . Fatty liver 04/09/08   found in abd CT  . GERD (gastroesophageal reflux disease)   . H/O: CVA (cardiovascular accident)    TIA  . H/O: rheumatic fever   . Hyperlipidemia   . Incontinence   . Insomnia   . Obstructive sleep apnea    not using CPAP, last sleep study 2.5 years ago, per pt  doctor is aware  . Platelets decreased ([place])   . RLS (restless legs syndrome)   . Shortness of breath   . Sleep apnea   . Stroke [facility])    TIA- 2002  . Ulcer   . Unspecified disorders of nervous system     Past Surgical History:  Procedure Laterality Date  . ABDOMINAL EXPLORATION SURGERY    . ABDOMINAL HYSTERECTOMY     total  . Axillary artery cannulation via 8-mm Hemashield graft, median sternotomy, extracorporeal circulation with deep hypothermic circulatory arrest, repair of  aortic dessection  01/23/2009   Dr Arlyce Dice  . BACK SURGERY     x 45  . cataract     both eyes  . CHOLECYSTECTOMY    . COLONOSCOPY    . EYE SURGERY     bilat  . HEMORROIDECTOMY     and colon polyp removed  . LIVER BIOPSY  04/10/2012   Procedure: LIVER BIOPSY;  Surgeon: Inda Castle, MD;  Location: [facility];  Service: Endoscopy;  Laterality: N/A;  ultrasound to mark order for abd limited/liver to be marked to be sent by [email or web address]   . LUMBAR FUSION  10/09  . LUMBAR WOUND DEBRIDEMENT  05/09/2011  Procedure: LUMBAR WOUND DEBRIDEMENT;  Surgeon: Dahlia Bailiff;  Location: [place];  Service: Orthopedics;  Laterality: N/A;  IRRIGATION AND DEBRIDEMENT SPINAL WOUND  . POSTERIOR CERVICAL FUSION/FORAMINOTOMY    . ROTATOR CUFF REPAIR     left    Family History  Problem Relation Age of Onset  . Colon cancer Neg Hx   . Esophageal cancer Neg Hx   . Rectal cancer Neg Hx   . Breast cancer Mother   . Lung cancer Mother   . Stomach cancer Father   . Diabetes      4 aunts, and 1 uncle    Social History:  reports that she quit smoking about 2 years ago. Her smoking use included Cigarettes. She has a 75.00 pack-year smoking history. She has never used smokeless tobacco. She reports that she does not drink alcohol or use drugs.  She is a resident at the [facility].  She is accompanied by a staff member today. Allergies:  Allergies  Allergen Reactions  . Doxycycline Hives, Swelling and Other (See  Comments)    Reaction:  Facial swelling     Current Medications: Current Outpatient Prescriptions  Medication Sig Dispense Refill  . acidophilus (RISAQUAD) CAPS capsule Take 1 capsule by mouth daily.    Marland Kitchen albuterol (VENTOLIN HFA) 108 (90 Base) MCG/ACT inhaler Inhale 2 puffs into the lungs every 6 (six) hours as needed for wheezing or shortness of breath. 1 Inhaler 11  . ARIPiprazole (ABILIFY) 30 MG tablet Take 30 mg by mouth daily.    . budesonide-formoterol (SYMBICORT) 160-4.5 MCG/ACT inhaler Inhale 2 puffs into the lungs 2 (two) times daily.    Marland Kitchen buPROPion (WELLBUTRIN SR) 100 MG 12 hr tablet Take 100 mg by mouth daily.     . celecoxib (CELEBREX) 100 MG capsule Take 100 mg by mouth daily.    . clonazePAM (KLONOPIN) 0.5 MG tablet Take 0.5 mg by mouth 2 (two) times daily.    Marland Kitchen dicyclomine (BENTYL) 10 MG capsule Take 10 mg by mouth 4 (four) times daily -  before meals and at bedtime.    . docusate sodium (COLACE) 100 MG capsule Take 100 mg by mouth 2 (two) times daily.    . ferrous sulfate 325 (65 FE) MG tablet Take 325 mg by mouth daily with breakfast.    . furosemide (LASIX) 20 MG tablet Take 21m in AM, 240mat noon and 2040mt 2pm    . gabapentin (NEURONTIN) 600 MG tablet Take 600 mg by mouth at bedtime.    . iMarland Kitchenuprofen (ADVIL,MOTRIN) 400 MG tablet Take 400 mg by mouth every 6 (six) hours as needed.    . insulin lispro (HUMALOG) 100 UNIT/ML injection Inject 0-9 Units into the skin 3 (three) times daily with meals as needed for high blood sugar. Pt uses as needed per sliding scale:    150-200:  0 units  201-250:  5 units  251-300:  8 units 301-350:  9 units  Greater than 350:  Call MD    . lamoTRIgine (LAMICTAL) 100 MG tablet Take 100 mg by mouth 2 (two) times daily.    . lMarland Kitchenperamide (IMODIUM) 2 MG capsule Take by mouth as needed for diarrhea or loose stools.    . LMarland KitchenRazepam (ATIVAN) 0.5 MG tablet Take 0.5 mg by mouth as needed for anxiety.    . metFORMIN (GLUCOPHAGE) 500 MG tablet Take  500 mg by mouth 2 (two) times daily with a meal.    . metoCLOPramide (REGLAN) 10  MG tablet Take 1 tablet (10 mg total) by mouth 4 (four) times daily -  before meals and at bedtime. 60 tablet 0  . metoprolol tartrate (LOPRESSOR) 25 MG tablet Take 25 mg by mouth 2 (two) times daily.    . mirtazapine (REMERON) 15 MG tablet Take 15 mg by mouth at bedtime.    . Multiple Vitamin (MULTIVITAMIN WITH MINERALS) TABS tablet Take 1 tablet by mouth daily.    Marland Kitchen omeprazole (PRILOSEC) 40 MG capsule Take 40 mg by mouth daily before breakfast.    . oxybutynin (DITROPAN-XL) 5 MG 24 hr tablet Take 5 mg by mouth daily.    Marland Kitchen oxyCODONE (OXY IR/ROXICODONE) 5 MG immediate release tablet Take 5 mg by mouth every 12 (twelve) hours as needed for severe pain.     . potassium chloride SA (K-DUR,KLOR-CON) 20 MEQ tablet Take 20 mEq by mouth daily.    . pregabalin (LYRICA) 50 MG capsule Take 50 mg by mouth 3 (three) times daily.    Marland Kitchen senna (SENOKOT) 8.6 MG tablet Take 1 tablet by mouth at bedtime.    . simvastatin (ZOCOR) 10 MG tablet Take 10 mg by mouth daily.    . sucralfate (CARAFATE) 1 g tablet Take 1 tablet (1 g total) by mouth 4 (four) times daily. 120 tablet 1  . vitamin C (ASCORBIC ACID) 500 MG tablet Take 500 mg by mouth 2 (two) times daily.    Marland Kitchen zolpidem (AMBIEN) 5 MG tablet Take 5 mg by mouth at bedtime.     Marland Kitchen rOPINIRole (REQUIP) 3 MG tablet Take 3 mg by mouth at bedtime.     No current facility-administered medications for this visit.     Review of Systems:  GENERAL:  Low energy.  No fevers or sweats.  Weight up 2 pounds. PERFORMANCE STATUS (ECOG):  1 HEENT:  No visual changes, runny nose, sore throat, mouth sores or tenderness. Lungs:  Shortness of breath.  Wheezing at times. No hemoptysis. Cardiac:  No chest pain, palpitations, orthopnea, or PND. GI: Eating well.  Black stool secondary to iron.  No nausea, vomiting, diarrhea, constipation, or hematochezia. GU:  No urgency, frequency, dysuria, or  hematuria. Musculoskeletal:  Right hip bursitis/arthritis.  No back pain.  No muscle tenderness. Extremities:  No pain or swelling. Skin:  Still has fungal infection.  Cellulitis lower leg. Neuro:  Tingling in feet.  No focal weakness.  No seizures.  No headache, numbness or weakness, balance or coordination issues. Endocrine:  Diabetes.  No thyroid issues, hot flashes or night sweats. Psych:  No mood changes, depression or anxiety. Pain:  No focal pain. Review of systems:  All other systems reviewed and found to be negative.  Physical Exam: Blood pressure 105/72, pulse 64, temperature 97.2 F (36.2 C), temperature source Tympanic, resp. rate 18, weight 229 lb 11.5 oz (104.2 kg). GENERAL:  Well developed, well nourished, heavyset woman sitting comfortably in the exam room in no acute distress.  She needs a 2 person assist to get onto the exam table. MENTAL STATUS:  Alert and oriented to person, place and time. HEAD:  Short gray hair.  Normocephalic, atraumatic, face symmetric, no Cushingoid features. EYES:  Blue eyes s/p cataract surgery.  Pupils equal round and reactive to light and accomodation.  No conjunctivitis or scleral icterus. ENT:  Oropharynx clear without lesion.  Dentures.  Tongue normal. Mucous membranes moist.  RESPIRATORY:  Clear to auscultation without rales, wheezes or rhonchi. CARDIOVASCULAR:  Regular rate and rhythm without murmur, rub  or gallop. ABDOMEN:  Soft, fully round, with active bowel sounds, and no appreciable hepatosplenomegaly.  No guarding or rebound tenderness.  No masses. SKIN: Mild right lower extremity erythema without significant increased warmth. EXTREMITIES:  Chronic bilateral lower extremity edema.  No palpable cords. LYMPH NODES: No palpable cervical, supraclavicular, axillary or inguinal adenopathy  NEUROLOGICAL: Unremarkable. PSYCH:  Appropriate.    Appointment on 01/29/2016  Component Date Value Ref Range Status  . WBC 01/29/2016 5.9  3.6 -  11.0 K/uL Final  . RBC 01/29/2016 3.88  3.80 - 5.20 MIL/uL Final  . Hemoglobin 01/29/2016 12.0  12.0 - 16.0 g/dL Final  . HCT 01/29/2016 34.2* 35.0 - 47.0 % Final  . MCV 01/29/2016 88.0  80.0 - 100.0 fL Final  . MCH 01/29/2016 30.8  26.0 - 34.0 pg Final  . MCHC 01/29/2016 35.0  32.0 - 36.0 g/dL Final  . RDW 01/29/2016 19.1* 11.5 - 14.5 % Final  . Platelets 01/29/2016 98* 150 - 440 K/uL Final  . Neutrophils Relative % 01/29/2016 74%  % Final  . Neutro Abs 01/29/2016 4.4  1.4 - 6.5 K/uL Final  . Lymphocytes Relative 01/29/2016 14%  % Final  . Lymphs Abs 01/29/2016 0.8* 1.0 - 3.6 K/uL Final  . Monocytes Relative 01/29/2016 9%  % Final  . Monocytes Absolute 01/29/2016 0.5  0.2 - 0.9 K/uL Final  . Eosinophils Relative 01/29/2016 2%  % Final  . Eosinophils Absolute 01/29/2016 0.1  0 - 0.7 K/uL Final  . Basophils Relative 01/29/2016 1%  % Final  . Basophils Absolute 01/29/2016 0.0  0 - 0.1 K/uL Final    Assessment:  Stephanie [name] is a 65 y.o. 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 Platelets have ranged between 108,000 and 128,000 without trend. She denies any new medications or herbal products.  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  Work-up on 08/29/2014 revealed a positive rheumatoid factor and ANA (anti-double-stranded DNA of 5- equivocal).  Negative studies included hepatitis B surface antibody and antigen, hepatitis C testing, and HIV testing. B12, SPEP, and free light chains were normal.  Additional testing on 10/31/2014 revealed negative hepatitis B core antibody and CMV IgM.  Abdominal ultrasound on 06/27/2014 revealed mild splenomegaly (14.2 cm) with moderate cirrhosis and no focal liver lesions. AFP was normal (4) on 04/21/2015.  She denies any alcohol use.  She has a history of recurrent iron deficiency anemia.  Ferritin was 20 on 05/11/2015 and 15 on 11/13/2015. Hematocrit was 29.7 on  11/13/2015.  Last colonoscopy was in 08/2014.  Guaiac cards were negative x 3 in 11/2015.  Diet is good.  She has ice pica associated with her anemia.  Symptomatically, she remains fatigued.  Hematocrit is 34.2 and platelet count is 98,000.  Ferritin has improved from 15 to 46.  Plan: 1.  Labs today:  CBC with diff, ferritin. 2.  Increase ferrous sulfate to 325 mg po BID with OJ or vitamin C. 3.  [facility] slips for CBC with diff and ferritin in 2 months and 4 months. 4.  RTC in 4 months for MD assessment and review of labs.   Lequita Asal, MD  01/29/2016

## 2016-01-29 NOTE — Telephone Encounter (Signed)
  CBC and ferritin

## 2016-01-29 NOTE — Telephone Encounter (Signed)
Called to state that we need to call lab orders to [place] for this patient which need to be drawn in 2 months?

## 2016-01-30 ENCOUNTER — Encounter: Payer: Self-pay | Admitting: Hematology and Oncology

## 2016-01-30 DIAGNOSIS — D509 Iron deficiency anemia, unspecified: Secondary | ICD-10-CM | POA: Insufficient documentation

## 2016-01-31 ENCOUNTER — Emergency Department: Payer: Medicare Other

## 2016-01-31 ENCOUNTER — Emergency Department
Admission: EM | Admit: 2016-01-31 | Discharge: 2016-01-31 | Disposition: A | Discharge status: Home / Self Care | Payer: Medicare Other | Attending: Emergency Medicine | Admitting: Emergency Medicine

## 2016-01-31 DIAGNOSIS — E119 Type 2 diabetes mellitus without complications: Secondary | ICD-10-CM | POA: Diagnosis not present

## 2016-01-31 DIAGNOSIS — Z87891 Personal history of nicotine dependence: Secondary | ICD-10-CM | POA: Insufficient documentation

## 2016-01-31 DIAGNOSIS — Z791 Long term (current) use of non-steroidal anti-inflammatories (NSAID): Secondary | ICD-10-CM | POA: Diagnosis not present

## 2016-01-31 DIAGNOSIS — R609 Edema, unspecified: Secondary | ICD-10-CM

## 2016-01-31 DIAGNOSIS — Z7984 Long term (current) use of oral hypoglycemic drugs: Secondary | ICD-10-CM | POA: Diagnosis not present

## 2016-01-31 DIAGNOSIS — Z79899 Other long term (current) drug therapy: Secondary | ICD-10-CM | POA: Diagnosis not present

## 2016-01-31 DIAGNOSIS — I10 Essential (primary) hypertension: Secondary | ICD-10-CM | POA: Insufficient documentation

## 2016-01-31 DIAGNOSIS — J45909 Unspecified asthma, uncomplicated: Secondary | ICD-10-CM | POA: Insufficient documentation

## 2016-01-31 DIAGNOSIS — L03116 Cellulitis of left lower limb: Secondary | ICD-10-CM | POA: Diagnosis not present

## 2016-01-31 DIAGNOSIS — Z743 Need for continuous supervision: Secondary | ICD-10-CM | POA: Diagnosis not present

## 2016-01-31 DIAGNOSIS — Z794 Long term (current) use of insulin: Secondary | ICD-10-CM | POA: Insufficient documentation

## 2016-01-31 DIAGNOSIS — M7989 Other specified soft tissue disorders: Secondary | ICD-10-CM | POA: Diagnosis not present

## 2016-01-31 DIAGNOSIS — R0602 Shortness of breath: Secondary | ICD-10-CM | POA: Diagnosis not present

## 2016-01-31 LAB — CBC WITH DIFFERENTIAL/PLATELET
BASOS ABS: 0 10*3/uL (ref 0–0.1)
EOS ABS: 0.2 10*3/uL (ref 0–0.7)
Eosinophils Relative: 3 %
HEMATOCRIT: 34.7 % — AB (ref 35.0–47.0)
HEMOGLOBIN: 11.7 g/dL — AB (ref 12.0–16.0)
Lymphocytes Relative: 19 %
Lymphs Abs: 1 10*3/uL (ref 1.0–3.6)
MCH: 30.2 pg (ref 26.0–34.0)
MCHC: 33.9 g/dL (ref 32.0–36.0)
MCV: 89.2 fL (ref 80.0–100.0)
Monocytes Absolute: 0.5 10*3/uL (ref 0.2–0.9)
NEUTROS ABS: 3.7 10*3/uL (ref 1.4–6.5)
Platelets: 95 10*3/uL — ABNORMAL LOW (ref 150–440)
RBC: 3.88 MIL/uL (ref 3.80–5.20)
RDW: 19 % — ABNORMAL HIGH (ref 11.5–14.5)
WBC: 5.4 10*3/uL (ref 3.6–11.0)

## 2016-01-31 LAB — COMPREHENSIVE METABOLIC PANEL
ALBUMIN: 3.2 g/dL — AB (ref 3.5–5.0)
ALK PHOS: 175 U/L — AB (ref 38–126)
ALT: 23 U/L (ref 14–54)
ANION GAP: 6 (ref 5–15)
AST: 33 U/L (ref 15–41)
BILIRUBIN TOTAL: 0.6 mg/dL (ref 0.3–1.2)
BUN: 16 mg/dL (ref 6–20)
CALCIUM: 8.9 mg/dL (ref 8.9–10.3)
CO2: 29 mmol/L (ref 22–32)
Chloride: 105 mmol/L (ref 101–111)
Creatinine, Ser: 0.82 mg/dL (ref 0.44–1.00)
GFR calc Af Amer: >60 mL/min (ref >60)
GFR calc non Af Amer: >60 mL/min (ref >60)
GLUCOSE: 209 mg/dL — AB (ref 65–99)
POTASSIUM: 3.9 mmol/L (ref 3.5–5.1)
SODIUM: 140 mmol/L (ref 135–145)
Total Protein: 6.5 g/dL (ref 6.5–8.1)

## 2016-01-31 LAB — PROTIME-INR
INR: 1.11
Prothrombin Time: 14.4 seconds (ref 11.4–15.2)

## 2016-01-31 LAB — BRAIN NATRIURETIC PEPTIDE: B NATRIURETIC PEPTIDE 5: 127 pg/mL — AB (ref 0.0–100.0)

## 2016-01-31 MED ORDER — CLINDAMYCIN HCL 300 MG PO CAPS: 300.0000 mg | ORAL_CAPSULE | Freq: Three times a day (TID) | ORAL | 0 refills | Status: DC

## 2016-01-31 MED ORDER — HYDROCODONE-ACETAMINOPHEN 5-325 MG PO TABS
2.0000 | ORAL_TABLET | ORAL | Status: AC
  Administered 2016-01-31: 2 via ORAL
  Filled 2016-01-31: qty 2

## 2016-01-31 NOTE — ED Notes (Signed)
E-signature pad not working. Pt is being transported by EMS. Called the [facility] and they would not provide transportation.

## 2016-01-31 NOTE — ED Triage Notes (Signed)
Pt came to ED via EMS from the [facility]. Pt c/o foot swelling for the past 2 months. Reports has been treated for cellulitis and has been on different antibiotics. Has appointment with cardiologist in September to evaluated for CHF. C/o of some sob, having to use pillows when sleeping.

## 2016-01-31 NOTE — ED Provider Notes (Signed)
[facility] Emergency Department Provider Note   ____________________________________________   First MD Initiated Contact with Patient 01/31/16 0818     (approximate)  I have reviewed the triage vital signs and the nursing notes.   HISTORY  Chief Complaint Leg Swelling    HPI Stephanie [name] is a 65 y.o. female presents for evaluation of pain in both legs, increased swelling.  Patient reports she has been on over 3 different antibiotic courses for suspected severe infection in both lower legs. Swelling in both legs continue to increase, she has noticed redness, and on discussion she is also had a small dot-like rash over both legs for the last 3 weeks. Reports she recently completed one more round of antibiotic, and today as the pain continues to worsen along with the swelling she came to the emergency room.  She states she feels slight shortness of breath at night when she lays down, but this has been ongoing for 2-3 months. She has an appointment with cardiology as recommended by her doctor coming up. Denies shortness of breath, cough, or other concerns at this time. No abdominal pain or chest pain.  Denies history of blood clots in the legs. No numbness or tingling in the legs. No cold or blue feet.   Past Medical History:  Diagnosis Date  . Adenomatous colon polyp 08/27/2014   Polyps x 3  . Anemia   . Anxiety   . 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 Arthritis   . Asthma   . Bipolar affective disorder ([place])    h/o  . Blood transfusion 2000  . Blood transfusion without reported diagnosis   . Chronic abdominal pain    abdominal wall pain  . 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 Colon polyp   . Depression   . Diabetes mellitus without complication ([place])   . Fatty liver 04/09/08   found in abd CT  . GERD (gastroesophageal reflux disease)   . H/O: CVA (cardiovascular accident)    TIA  . H/O: rheumatic fever   . Hyperlipidemia   .  Incontinence   . Insomnia   . Obstructive sleep apnea    not using CPAP, last sleep study 2.5 years ago, per pt doctor is aware  . Platelets decreased ([place])   . RLS (restless legs syndrome)   . Shortness of breath   . Sleep apnea   . Stroke [facility])    TIA- 2002  . Ulcer   . Unspecified disorders of nervous system     Patient Active Problem List   Diagnosis Date Noted  . Iron deficiency anemia 01/30/2016  . Hypoglycemia 09/09/2015  . Hyperglycemia 07/16/2015  . Neuropathy ([place]) 06/08/2015  . Thrombocytopenia ([place]) 08/19/2014  . Abdominal pain, chronic, epigastric 06/12/2014  . Liver cirrhosis secondary to NASH 02/27/2014  . Dizziness and giddiness 10/30/2013  . DOE (dyspnea on exertion) 10/30/2013  . Poorly controlled type 2 diabetes mellitus with complication ([place]) 71/69/6789  . DNR (do not resuscitate) 07/29/2013  . Encounter for routine gynecological examination 07/29/2013  . Dyspnea 07/10/2013  . Lung nodule 07/10/2013  . Varicose veins of lower extremities with other complications 38/03/1750  . GERD (gastroesophageal reflux disease) 10/09/2012  . Cirrhosis of liver ([place]) 02/02/2012  . Anxiety 07/11/2011  . Aortic dissection ([place])   . BACK PAIN, LUMBAR, CHRONIC 11/19/2009  . UNS ADVRS EFF OTH RX MEDICINAL&BIOLOGICAL SBSTNC 12/16/2008  . UNSPECIFIED VITAMIN D DEFICIENCY 09/26/2008  .  TOBACCO ABUSE 06/19/2008  . RESTLESS LEGS SYNDROME 04/27/2007  . INSOMNIA 04/27/2007  . HLD (hyperlipidemia) 03/08/2007  . OSA (obstructive sleep apnea) 03/08/2007  . Essential hypertension 03/08/2007  . Asthma 03/08/2007  . BIPOLAR AFFECTIVE DISORDER, HX OF 03/08/2007    Past Surgical History:  Procedure Laterality Date  . ABDOMINAL EXPLORATION SURGERY    . ABDOMINAL HYSTERECTOMY     total  . Axillary artery cannulation via 8-mm Hemashield graft, median sternotomy, extracorporeal circulation with deep hypothermic circulatory arrest, repair of  aortic dessection  01/23/2009   Dr  Arlyce Dice  . BACK SURGERY     x 45  . cataract     both eyes  . CHOLECYSTECTOMY    . COLONOSCOPY    . EYE SURGERY     bilat  . HEMORROIDECTOMY     and colon polyp removed  . LIVER BIOPSY  04/10/2012   Procedure: LIVER BIOPSY;  Surgeon: Inda Castle, MD;  Location: [facility] END[facility]COPY;  Service: Endoscopy;  Laterality: N/A;  ultrasound to mark order for abd limited/liver to be marked to be sent by [email or web address]   . LUMBAR FUSION  10/09  . LUMBAR WOUND DEBRIDEMENT  05/09/2011   Procedure: LUMBAR WOUND DEBRIDEMENT;  Surgeon: Dahlia Bailiff;  Location: [place];  Service: Orthopedics;  Laterality: N/A;  IRRIGATION AND DEBRIDEMENT SPINAL WOUND  . POSTERIOR CERVICAL FUSION/FORAMINOTOMY    . ROTATOR CUFF REPAIR     left    Prior to Admission medications   Medication Sig Start Date End Date Taking? Authorizing Provider  albuterol (PROAIR HFA) 108 (90 Base) MCG/ACT inhaler Inhale 2 puffs into the lungs See admin instructions. Inhale 2 puffs every 4 to 6 hours as needed for cough, wheeze or chest congestion.   Yes Historical Provider, MD  ARIPiprazole (ABILIFY) 30 MG tablet Take 30 mg by mouth daily.   Yes Historical Provider, MD  budesonide-formoterol (SYMBICORT) 160-4.5 MCG/ACT inhaler Inhale 2 puffs into the lungs 2 (two) times daily.   Yes Historical Provider, MD  buPROPion (WELLBUTRIN SR) 150 MG 12 hr tablet Take 150 mg by mouth daily.   Yes Historical Provider, MD  celecoxib (CELEBREX) 100 MG capsule Take 100 mg by mouth daily.   Yes Historical Provider, MD  clonazePAM (KLONOPIN) 0.5 MG tablet Take 0.5 mg by mouth 2 (two) times daily.   Yes Historical Provider, MD  dicyclomine (BENTYL) 10 MG capsule Take 10 mg by mouth 3 (three) times daily.    Yes Historical Provider, MD  docusate sodium (COLACE) 100 MG capsule Take 100 mg by mouth 2 (two) times daily.   Yes Historical Provider, MD  famotidine (PEPCID) 20 MG tablet Take 20 mg by mouth 2 (two) times daily.   Yes Historical Provider, MD  ferrous  sulfate 325 (65 FE) MG tablet Take 325 mg by mouth 2 (two) times daily.    Yes Historical Provider, MD  furosemide (LASIX) 80 MG tablet Take 80 mg by mouth daily.   Yes Historical Provider, MD  gabapentin (NEURONTIN) 600 MG tablet Take 600 mg by mouth 2 (two) times daily.    Yes Historical Provider, MD  insulin lispro (HUMALOG) 100 UNIT/ML injection Inject 0-9 Units into the skin 3 (three) times daily before meals. Pt uses as needed per sliding scale:    150-200:  0 units  201-250:  5 units  251-300:  8 units 301-350:  9 units  Greater than 350:  Call MD   Yes Historical Provider, MD  lactulose ([place]) 10 GM/15ML  solution Take 10 g by mouth daily.   Yes Historical Provider, MD  lamoTRIgine (LAMICTAL) 100 MG tablet Take 100 mg by mouth 2 (two) times daily.   Yes Historical Provider, MD  loperamide (IMODIUM) 2 MG capsule Take 4 mg by mouth as needed for diarrhea or loose stools. *Do not exceed 8 doses in 24 hours*   Yes Historical Provider, MD  LORazepam (ATIVAN) 0.5 MG tablet Take 0.5 mg by mouth daily as needed for anxiety.    Yes Historical Provider, MD  metFORMIN (GLUCOPHAGE) 500 MG tablet Take 500-1,000 mg by mouth See admin instructions. Take 2 tablets (1000 mg) by mouth every morning with meal, and take 1 tablet by mouth every evening at 5 pm with meal.   Yes Historical Provider, MD  metoCLOPramide (REGLAN) 5 MG tablet Take 5 mg by mouth 4 (four) times daily.   Yes Historical Provider, MD  metoprolol tartrate (LOPRESSOR) 25 MG tablet Take 25 mg by mouth 2 (two) times daily.   Yes Historical Provider, MD  Multiple Vitamin (MULTIVITAMIN WITH MINERALS) TABS tablet Take 1 tablet by mouth daily with breakfast.    Yes Historical Provider, MD  omeprazole (PRILOSEC) 40 MG capsule Take 40 mg by mouth daily before breakfast.   Yes Historical Provider, MD  oxybutynin (DITROPAN-XL) 5 MG 24 hr tablet Take 5 mg by mouth daily.   Yes Historical Provider, MD  oxyCODONE (OXY IR/ROXICODONE) 5 MG immediate  release tablet Take 5 mg by mouth every 12 (twelve) hours.    Yes Historical Provider, MD  potassium chloride (K-DUR,KLOR-CON) 10 MEQ tablet Take 10 mEq by mouth every Monday, Wednesday, and Friday. *Note dose*   Yes Historical Provider, MD  potassium chloride SA (K-DUR,KLOR-CON) 20 MEQ tablet Take 20 mEq by mouth daily. *Note dose*   Yes Historical Provider, MD  pregabalin (LYRICA) 75 MG capsule Take 75 mg by mouth 3 (three) times daily.   Yes Historical Provider, MD  rOPINIRole (REQUIP) 3 MG tablet Take 3 mg by mouth at bedtime.   Yes Historical Provider, MD  senna (SENOKOT) 8.6 MG tablet Take 1 tablet by mouth 3 (three) times a week. Take on Monday, Wednesday, and Friday. *Hold for loose stools*   Yes Historical Provider, MD  simvastatin (ZOCOR) 10 MG tablet Take 10 mg by mouth at bedtime.    Yes Historical Provider, MD  sucralfate (CARAFATE) 1 g tablet Take 1 tablet (1 g total) by mouth 4 (four) times daily. 12/09/15  Yes Carrie Mew, MD  traMADol (ULTRAM) 50 MG tablet Take 50 mg by mouth every 6 (six) hours as needed for moderate pain.   Yes Historical Provider, MD  vitamin C (ASCORBIC ACID) 500 MG tablet Take 500 mg by mouth 2 (two) times daily.   Yes Historical Provider, MD  zolpidem (AMBIEN) 5 MG tablet Take 5 mg by mouth at bedtime.    Yes Historical Provider, MD  albuterol (VENTOLIN HFA) 108 (90 Base) MCG/ACT inhaler Inhale 2 puffs into the lungs every 6 (six) hours as needed for wheezing or shortness of breath. 12/24/15   Chesley Mires, MD  clindamycin (CLEOCIN) 300 MG capsule Take 1 capsule (300 mg total) by mouth 3 (three) times daily. 01/31/16   Delman Kitten, MD  metoCLOPramide (REGLAN) 10 MG tablet Take 1 tablet (10 mg total) by mouth 4 (four) times daily -  before meals and at bedtime. Patient not taking: Reported on 01/31/2016 12/09/15   Carrie Mew, MD  potassium chloride SA (K-DUR,KLOR-CON) 20 MEQ tablet Take  20 mEq by mouth daily. 11/16/15 01/29/16  Historical Provider, MD     Allergies Doxycycline  Family History  Problem Relation Age of Onset  . Breast cancer Mother   . Lung cancer Mother   . Stomach cancer Father   . Diabetes      4 aunts, and 1 uncle  . Colon cancer Neg Hx   . Esophageal cancer Neg Hx   . Rectal cancer Neg Hx     Social History Social History  Substance Use Topics  . Smoking status: Former Smoker    Packs/day: 1.50    Years: 50.00    Types: Cigarettes    Quit date: 04/06/2013  . Smokeless tobacco: Never Used  . Alcohol use No    Review of Systems Constitutional: Intermittent fevers over the last Eyes: No visual changes. ENT: No sore throat. Cardiovascular: Denies chest pain. Respiratory: Denies shortness of breath. Gastrointestinal: No abdominal pain.  No nausea, no vomiting.  No diarrhea.  No constipation. Genitourinary: Negative for dysuria. Musculoskeletal: Negative for back pain. Reports her legs or both so swollen and painful makes it hard for her to move them. Skin: See history of present illness Neurological: Negative for headaches, focal weakness or numbness.  10-point ROS otherwise negative.  ____________________________________________   PHYSICAL EXAM:  VITAL SIGNS: ED Triage Vitals [01/31/16 0810]  Enc Vitals Group     BP (!) 111/49     Pulse Rate 64     Resp 16     Temp 98.3 F (36.8 C)     Temp Source Oral     SpO2 96 %     Weight      Height      Head Circumference      Peak Flow      Pain Score      Pain Loc      Pain Edu?      Excl. in [place]?     Constitutional: Alert and oriented. Well appearing and in no acute distress. Eyes: Conjunctivae are normal. PERRL. EOMI. Head: Atraumatic. Nose: No congestion/rhinnorhea. Mouth/Throat: Mucous membranes are moist.  Oropharynx non-erythematous. Neck: No stridor.   Cardiovascular: Normal rate, regular rhythm. Grossly normal heart sounds.  Good peripheral circulation. Respiratory: Normal respiratory effort.  No retractions. Lungs  CTAB. Gastrointestinal: Soft and nontender. No distention.  Musculoskeletal: Bilateral lower extremity pitting edema just to the level from the knees down to the toes. Strong dorsalis pedis pulses bilateral. Normal capillary refill. Definite erythema and warmth overlying both lower legs, circumferentially with associated edema. There is a mild petechial rash noted over both feet. Neurologic:  Normal speech and language. No gross focal neurologic deficits are appreciated. Limitation of movement in the lower legs due to pain, but no evidence of deficit. Skin:  Skin is warm, dry and intact. No rash noted. Psychiatric: Mood and affect are normal. Speech and behavior are normal.  ____________________________________________   LABS (all labs ordered are listed, but only abnormal results are displayed)  Labs Reviewed  CBC WITH DIFFERENTIAL/PLATELET - Abnormal; Notable for the following:       Result Value   Hemoglobin 11.7 (*)    HCT 34.7 (*)    RDW 19.0 (*)    Platelets 95 (*)    All other components within normal limits  COMPREHENSIVE METABOLIC PANEL - Abnormal; Notable for the following:    Glucose, Bld 209 (*)    Albumin 3.2 (*)    Alkaline Phosphatase 175 (*)    All  other components within normal limits  BRAIN NATRIURETIC PEPTIDE - Abnormal; Notable for the following:    B Natriuretic Peptide 127.0 (*)    All other components within normal limits  PROTIME-INR   ____________________________________________  EKG   ____________________________________________  RADIOLOGY  Dg Chest 2 View  Result Date: 01/31/2016 CLINICAL DATA:  Lower extremity cellulitis and swelling. Shortness of breath. EXAM: CHEST  2 VIEW COMPARISON:  11/18/2015 FINDINGS: Cardiomegaly. No confluent airspace opacities or effusions. No acute bony abnormality. Spinal stimulator wires noted in the lower thoracic spine. IMPRESSION: Cardiomegaly.  No active disease. Electronically Signed   By: Rolm Baptise M.D.   On:  01/31/2016 08:59   Dg Tibia/fibula Left  Result Date: 01/31/2016 CLINICAL DATA:  Swollen legs legs. EXAM: LEFT TIBIA AND FIBULA - 2 VIEW COMPARISON:  None FINDINGS: No osseous abnormality. Joints normal. No subcutaneous gas. No foreign body. IMPRESSION: No acute osseous abnormality. Electronically Signed   By: Suzy Bouchard M.D.   On: 01/31/2016 09:01   Dg Tibia/fibula Right  Result Date: 01/31/2016 CLINICAL DATA:  Cellulitis and swelling. EXAM: RIGHT TIBIA AND FIBULA - 2 VIEW COMPARISON:  None. FINDINGS: No osseous abnormality of the RIGHT tibia fibula. Joints appear normal. No subcutaneous gas or foreign body IMPRESSION: No acute osseous abnormality. Electronically Signed   By: Suzy Bouchard M.D.   On: 01/31/2016 08:59   US Venous Img Lower Bilateral  Result Date: 01/31/2016 CLINICAL DATA:  Shortness of breath.  Leg swelling for 2 months. EXAM: BILATERAL LOWER EXTREMITY VENOUS DOPPLER ULTRASOUND TECHNIQUE: Gray-scale sonography with graded compression, as well as color Doppler and duplex ultrasound were performed to evaluate the lower extremity deep venous systems from the level of the common femoral vein and including the common femoral, femoral, profunda femoral, popliteal and calf veins including the posterior tibial, peroneal and gastrocnemius veins when visible. The superficial great saphenous vein was also interrogated. Spectral Doppler was utilized to evaluate flow at rest and with distal augmentation maneuvers in the common femoral, femoral and popliteal veins. COMPARISON:  None. FINDINGS: RIGHT LOWER EXTREMITY Common Femoral Vein: No evidence of thrombus. Normal compressibility, respiratory phasicity and response to augmentation. Saphenofemoral Junction: No evidence of thrombus. Normal compressibility and flow on color Doppler imaging. Profunda Femoral Vein: No evidence of thrombus. Normal compressibility and flow on color Doppler imaging. Femoral Vein: No evidence of thrombus. Normal  compressibility, respiratory phasicity and response to augmentation. Popliteal Vein: No evidence of thrombus. Normal compressibility, respiratory phasicity and response to augmentation. Calf Veins: No evidence of thrombus. Normal compressibility and flow on color Doppler imaging. Superficial Great Saphenous Vein: No evidence of thrombus. Normal compressibility and flow on color Doppler imaging. Venous Reflux:  None. Other Findings:  None. LEFT LOWER EXTREMITY Common Femoral Vein: No evidence of thrombus. Normal compressibility, respiratory phasicity and response to augmentation. Saphenofemoral Junction: No evidence of thrombus. Normal compressibility and flow on color Doppler imaging. Profunda Femoral Vein: No evidence of thrombus. Normal compressibility and flow on color Doppler imaging. Femoral Vein: No evidence of thrombus. Normal compressibility, respiratory phasicity and response to augmentation. Popliteal Vein: No evidence of thrombus. Normal compressibility, respiratory phasicity and response to augmentation. Calf Veins: No evidence of thrombus. Normal compressibility and flow on color Doppler imaging. Superficial Great Saphenous Vein: No evidence of thrombus. Normal compressibility and flow on color Doppler imaging. Venous Reflux:  None. Other Findings:  None. IMPRESSION: No evidence of deep venous thrombosis. Electronically Signed   By: Rolm Baptise M.D.   On: 01/31/2016 10:00  ____________________________________________   PROCEDURES  Procedure(s) performed: None  Procedures  Critical Care performed: No  ____________________________________________   INITIAL IMPRESSION / ASSESSMENT AND PLAN / ED COURSE  Pertinent labs & imaging results that were available during my care of the patient were reviewed by me and considered in my medical decision making (see chart for details).  Patient presents for evaluation of bilateral leg pain. Treated with multiple antibiotics with evidently a  little to no improvement. Patient's exam appears most consistent with bilateral lower external ear cellulitis with severe associated edema and petechial rash, however given slow improvement and bilateral nature would also consider other diagnoses including osteomyelitis in this diabetic patient, and also risks include DVT. She also has mild dyspnea at night it is very hemodynamically stable with clear lungs here and normal speech. Screen for CHF with BNP and chest x-ray.  Clinical Course   ----------------------------------------- 10:54 AM on 01/31/2016 -----------------------------------------  Labs reviewed, chronic thrombocytopenia noted. Outlined area of erythema on the patient's legs, in further discussion she tells me she is been treated with cephalexin, amoxicillin, and Cipro for cellulitis. However, none of these provide coverage for MRSA. She does not demonstrate any evidence of sepsis, and I question if some of these changes could be from chronic venous stasis versus actual cellulitis.  Discussed with the patient, and she will be comfortable going home with a trial for clindamycin, areas of erythema outlined, and doctors making house calls will be able to see her on Monday or Tuesday for the patient. Discussed careful return precautions, follow-up, and treatment recommendations for which the patient is agreeable.  ____________________________________________   FINAL CLINICAL IMPRESSION(S) / ED DIAGNOSES  Final diagnoses:  Peripheral edema      NEW MEDICATIONS STARTED DURING THIS VISIT:  New Prescriptions   CLINDAMYCIN (CLEOCIN) 300 MG CAPSULE    Take 1 capsule (300 mg total) by mouth 3 (three) times daily.     Note:  This document was prepared using Dragon voice recognition software and may include unintentional dictation errors.     Delman Kitten, MD 01/31/16 1055

## 2016-01-31 NOTE — ED Notes (Signed)
Pt transported to xray 

## 2016-01-31 NOTE — Discharge Instructions (Signed)
You have been seen today in the Emergency Department (ED) for cellulitis, a superficial skin infection. Please take your new antibiotics as prescribed for their ENTIRE prescribed duration.    Please follow up with your doctor at [place] or in the ED in 24-48 hours for recheck of your infection if you are not improving.  Call your doctor sooner or return to the ED if you develop worsening signs of infection such as: increased redness, increased pain, pus, fever, or other symptoms that concern you.

## 2016-02-02 DIAGNOSIS — R609 Edema, unspecified: Secondary | ICD-10-CM | POA: Diagnosis not present

## 2016-02-02 DIAGNOSIS — E119 Type 2 diabetes mellitus without complications: Secondary | ICD-10-CM | POA: Diagnosis not present

## 2016-02-02 DIAGNOSIS — I10 Essential (primary) hypertension: Secondary | ICD-10-CM | POA: Diagnosis not present

## 2016-02-02 DIAGNOSIS — L0390 Cellulitis, unspecified: Secondary | ICD-10-CM | POA: Diagnosis not present

## 2016-02-02 NOTE — Telephone Encounter (Signed)
Filled out two [employer] forms for patient  One to be done in two months and the other to be done in four months for cbc and ferritin. I have mailed the forms to the [facility] [place].

## 2016-02-04 DIAGNOSIS — L0390 Cellulitis, unspecified: Secondary | ICD-10-CM | POA: Diagnosis not present

## 2016-02-04 DIAGNOSIS — G629 Polyneuropathy, unspecified: Secondary | ICD-10-CM | POA: Diagnosis not present

## 2016-02-04 DIAGNOSIS — I10 Essential (primary) hypertension: Secondary | ICD-10-CM | POA: Diagnosis not present

## 2016-02-04 DIAGNOSIS — E119 Type 2 diabetes mellitus without complications: Secondary | ICD-10-CM | POA: Diagnosis not present

## 2016-02-09 DIAGNOSIS — K589 Irritable bowel syndrome without diarrhea: Secondary | ICD-10-CM | POA: Diagnosis not present

## 2016-02-09 DIAGNOSIS — E119 Type 2 diabetes mellitus without complications: Secondary | ICD-10-CM | POA: Diagnosis not present

## 2016-02-09 DIAGNOSIS — L0390 Cellulitis, unspecified: Secondary | ICD-10-CM | POA: Diagnosis not present

## 2016-02-09 DIAGNOSIS — Z79899 Other long term (current) drug therapy: Secondary | ICD-10-CM | POA: Diagnosis not present

## 2016-02-09 DIAGNOSIS — R609 Edema, unspecified: Secondary | ICD-10-CM | POA: Diagnosis not present

## 2016-02-10 DIAGNOSIS — R600 Localized edema: Secondary | ICD-10-CM | POA: Diagnosis not present

## 2016-02-10 DIAGNOSIS — R001 Bradycardia, unspecified: Secondary | ICD-10-CM | POA: Diagnosis not present

## 2016-02-10 DIAGNOSIS — R0602 Shortness of breath: Secondary | ICD-10-CM | POA: Diagnosis not present

## 2016-02-10 DIAGNOSIS — I6523 Occlusion and stenosis of bilateral carotid arteries: Secondary | ICD-10-CM | POA: Diagnosis not present

## 2016-02-11 DIAGNOSIS — I10 Essential (primary) hypertension: Secondary | ICD-10-CM | POA: Diagnosis not present

## 2016-02-11 DIAGNOSIS — E119 Type 2 diabetes mellitus without complications: Secondary | ICD-10-CM | POA: Diagnosis not present

## 2016-02-11 DIAGNOSIS — R269 Unspecified abnormalities of gait and mobility: Secondary | ICD-10-CM | POA: Diagnosis not present

## 2016-02-11 DIAGNOSIS — R600 Localized edema: Secondary | ICD-10-CM | POA: Diagnosis not present

## 2016-02-11 DIAGNOSIS — Z79899 Other long term (current) drug therapy: Secondary | ICD-10-CM | POA: Diagnosis not present

## 2016-02-18 IMAGING — CT CT CHEST NODULE FOLLOW UP LOW DOSE W/O CM
1 of 3 series · 14 of 31 positions shown, 18 images · non-contrast
Comparison: PE protocol CT scan 02/19/2015.  Multiple other priors.

CLINICAL DATA: 64-year-old female with history of pulmonary nodule.
Followup study.

EXAM:
CT CHEST WITHOUT CONTRAST
TECHNIQUE: Multidetector CT imaging of the chest was performed following the
standard protocol without IV contrast.

[Series 602: sagittal body · sagittal · 0.71mm/px · 14 of 147 slices shown, 18 images]
[im 8/147  mediastinal]
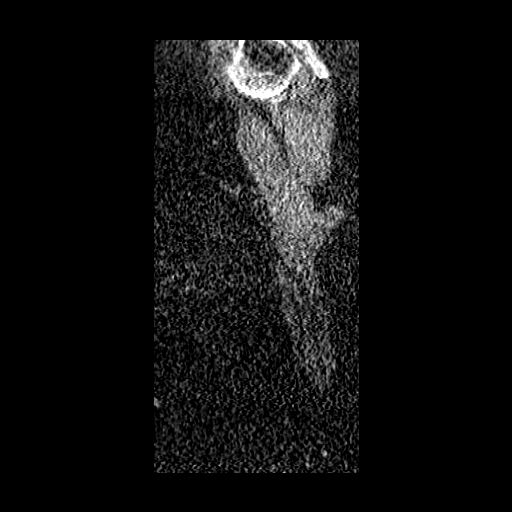
[im 8/147  lung]
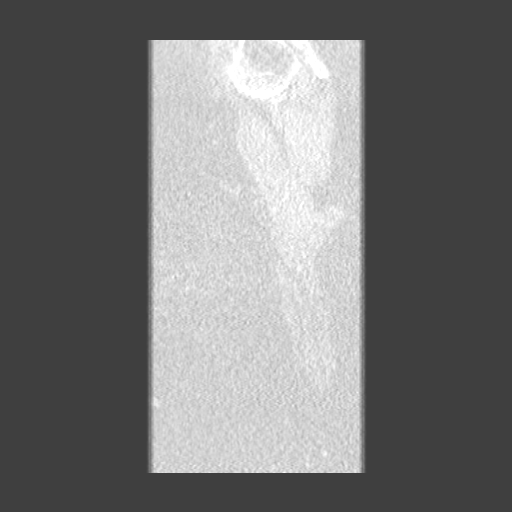
[im 24/147  lung]
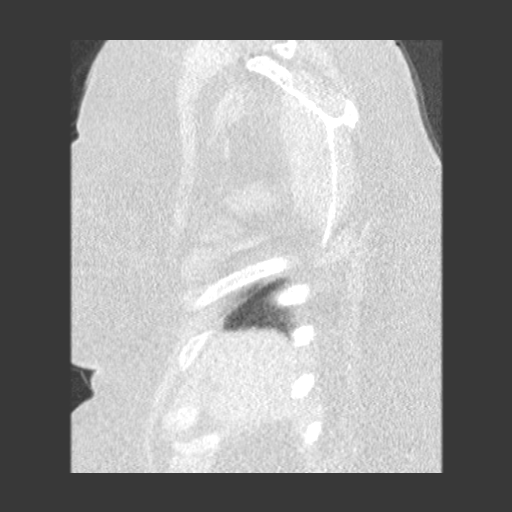
[im 31/147  lung]
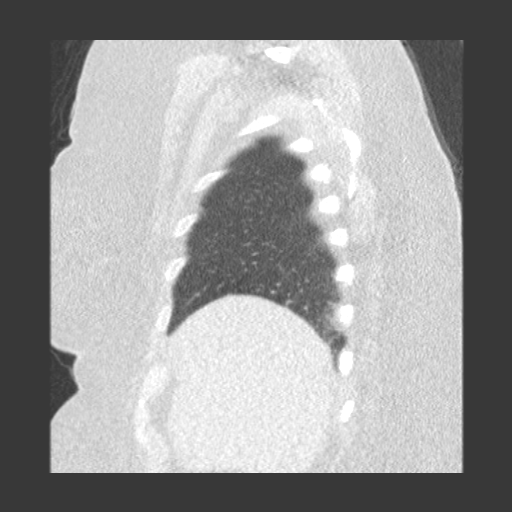
[im 39/147  lung]
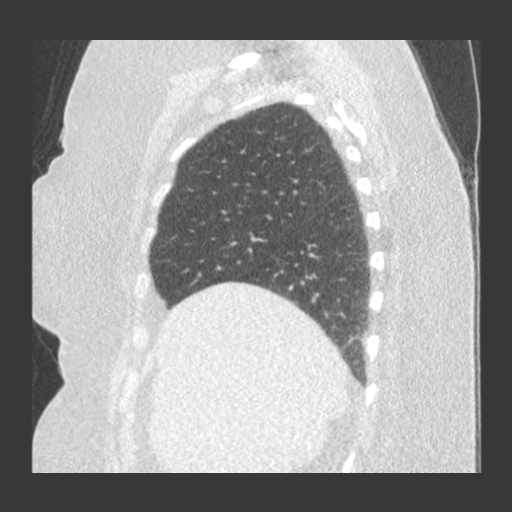
[im 54/147  mediastinal]
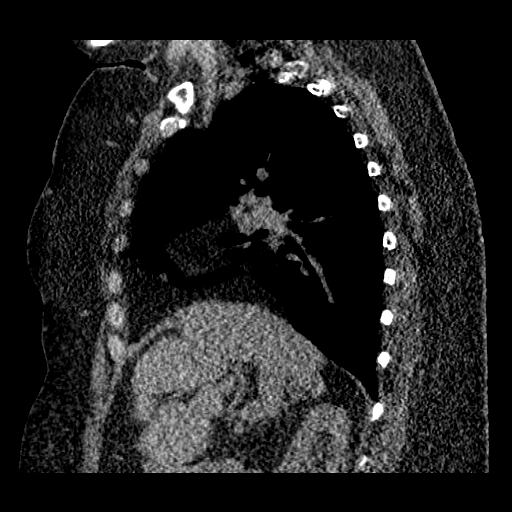
[im 54/147  lung]
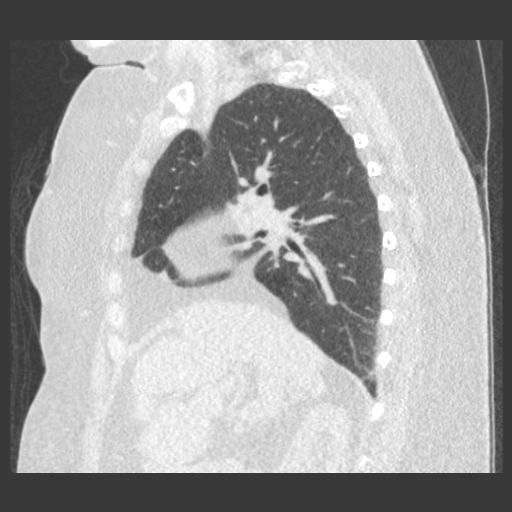
[im 62/147  lung]
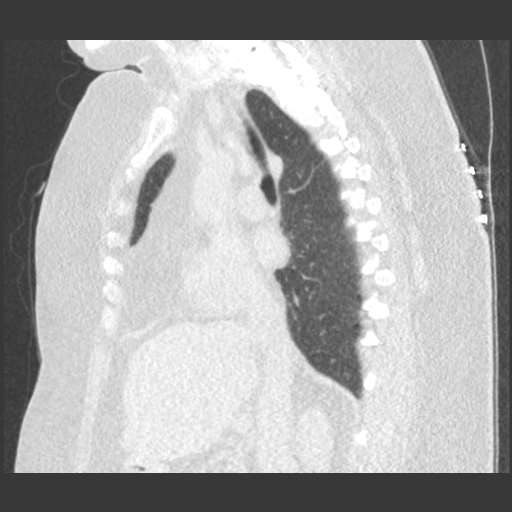
[im 70/147  lung]
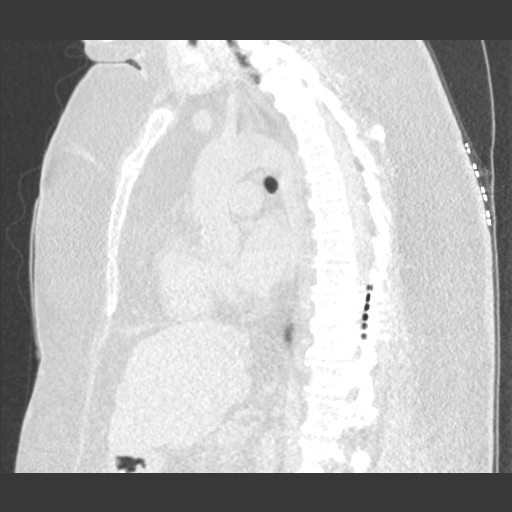
[im 77/147  lung]
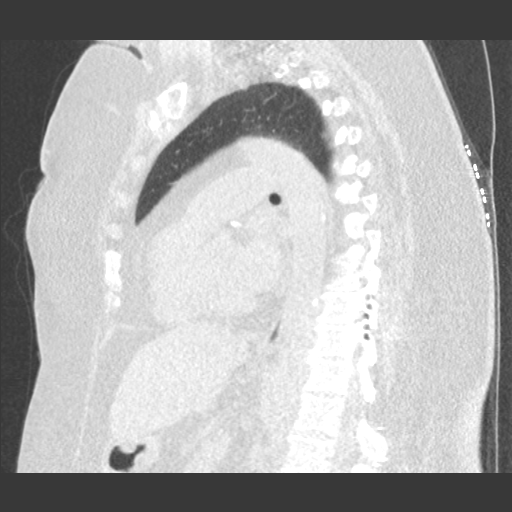
[im 85/147  mediastinal]
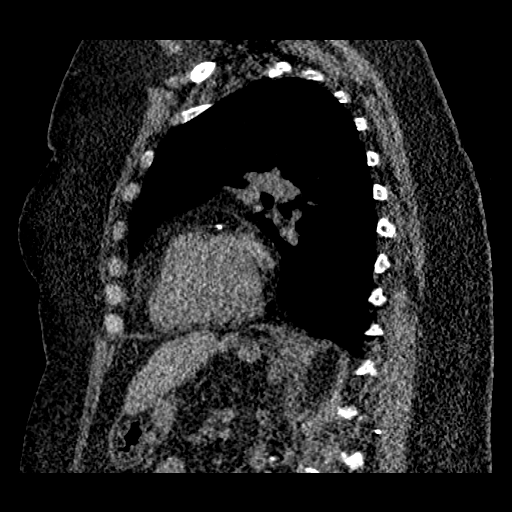
[im 85/147  lung]
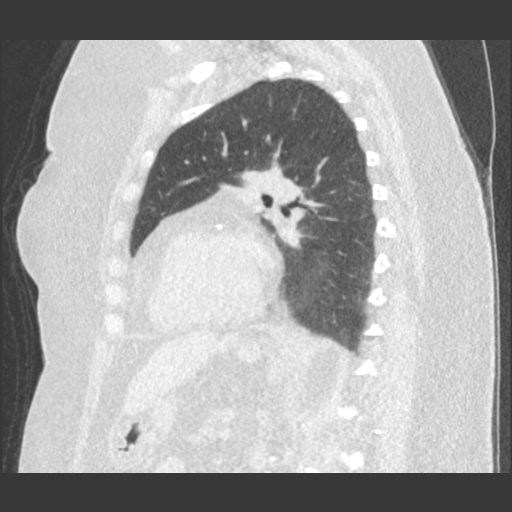
[im 93/147  lung]
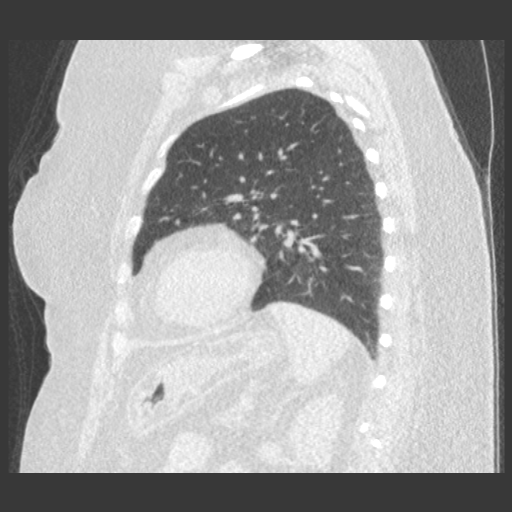
[im 108/147  lung]
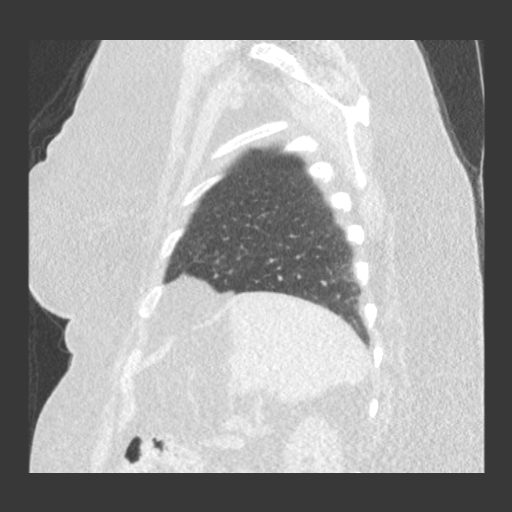
[im 116/147  lung]
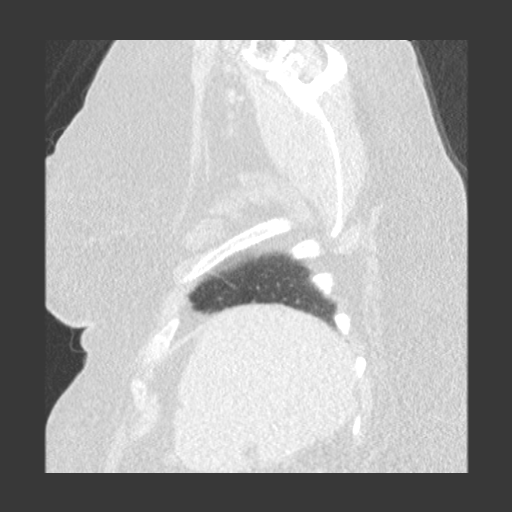
[im 123/147  mediastinal]
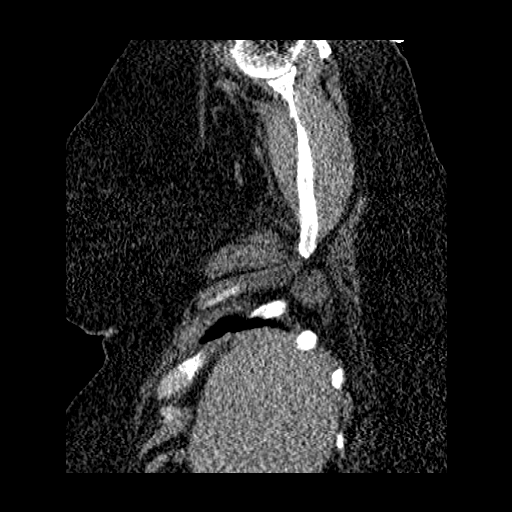
[im 123/147  lung]
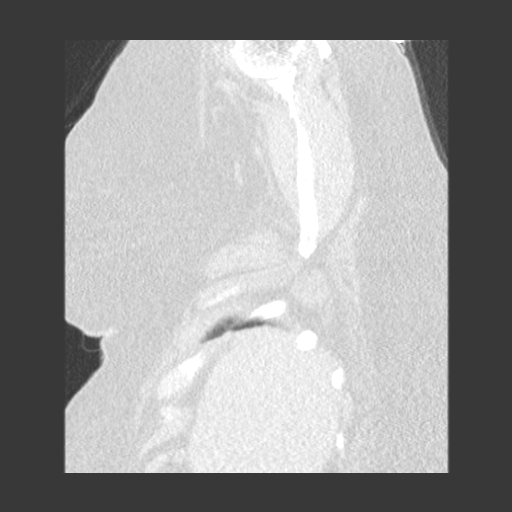
[im 139/147  lung]
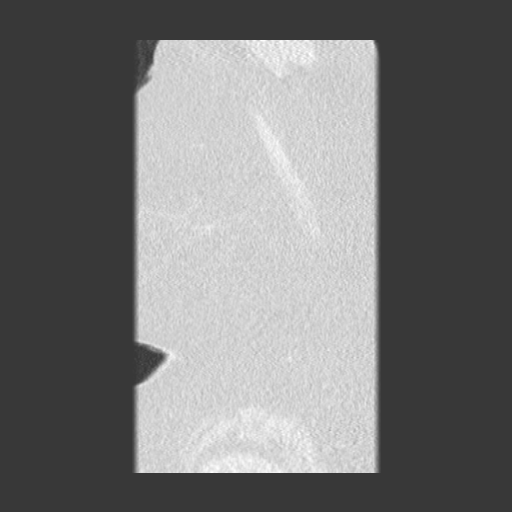

[14 of 31 positions shown; findings below may reference images not displayed]

FINDINGS: Mediastinum/Lymph Nodes: Heart size is normal. There is no
significant pericardial fluid, thickening or pericardial
calcification. There is atherosclerosis of the thoracic aorta, the
great vessels of the mediastinum and the coronary arteries,
including calcified atherosclerotic plaque in the left main, left
anterior descending and right coronary arteries. No pathologically
enlarged mediastinal or hilar lymph nodes. Please note that accurate
exclusion of hilar adenopathy is limited on noncontrast CT scans.
Esophagus is unremarkable in appearance. No axillary
lymphadenopathy.

Lungs/Pleura: Again noted is a pleural-based nodular area of
ground-glass attenuation in the apex of the right upper lobe (image
19 of series 3) measuring 7 mm, which is unchanged compared to prior
examinations dating back to 06/22/2011, considered benign, likely an
area of chronic scarring. No other larger more suspicious appearing
pulmonary nodules or masses are noted. No acute consolidative
airspace disease. No pleural effusions.

Upper Abdomen: Nodular contour of the liver, suggesting underlying
cirrhosis. Diffuse low attenuation throughout the hepatic
parenchyma, compatible with hepatic steatosis. Spleen is
incompletely visualized, but appears enlarged measuring up to 13.7 x
6.1 cm on axial images.

Musculoskeletal/Soft Tissues: Orthopedic fixation hardware in the
lower cervical spine. Spinal cord stimulator in the mid thoracic
region incidentally noted. There are no aggressive appearing lytic
or blastic lesions noted in the visualized portions of the skeleton.
IMPRESSION: 1. 7 mm ground-glass attenuation pleural-based nodule in the apex of
the right upper lobe is unchanged on prior studies dating back to
06/22/2011, considered benign, likely an area of chronic post
infectious or inflammatory scarring. No future imaging followup is
recommended.
2. Atherosclerosis, including left main and 2 vessel coronary artery
disease. Please note that although the presence of coronary artery
calcium documents the presence of coronary artery disease, the
severity of this disease and any potential stenosis cannot be
assessed on this non-gated CT examination. Assessment for potential
risk factor modification, dietary therapy or pharmacologic therapy
may be warranted, if clinically indicated.
3. Hepatic steatosis with stigmata of cirrhosis and portal
hypertension, including splenomegaly.

## 2016-02-19 DIAGNOSIS — Z79899 Other long term (current) drug therapy: Secondary | ICD-10-CM | POA: Diagnosis not present

## 2016-02-19 DIAGNOSIS — R300 Dysuria: Secondary | ICD-10-CM | POA: Diagnosis not present

## 2016-02-25 DIAGNOSIS — I10 Essential (primary) hypertension: Secondary | ICD-10-CM | POA: Diagnosis not present

## 2016-02-25 DIAGNOSIS — G629 Polyneuropathy, unspecified: Secondary | ICD-10-CM | POA: Diagnosis not present

## 2016-02-25 DIAGNOSIS — E119 Type 2 diabetes mellitus without complications: Secondary | ICD-10-CM | POA: Diagnosis not present

## 2016-02-25 DIAGNOSIS — R269 Unspecified abnormalities of gait and mobility: Secondary | ICD-10-CM | POA: Diagnosis not present

## 2016-02-25 DIAGNOSIS — R600 Localized edema: Secondary | ICD-10-CM | POA: Diagnosis not present

## 2016-02-26 ENCOUNTER — Telehealth: Payer: Self-pay | Admitting: Pulmonary Disease

## 2016-02-26 DIAGNOSIS — E782 Mixed hyperlipidemia: Secondary | ICD-10-CM | POA: Diagnosis not present

## 2016-02-26 DIAGNOSIS — R0602 Shortness of breath: Secondary | ICD-10-CM | POA: Diagnosis not present

## 2016-02-26 DIAGNOSIS — R600 Localized edema: Secondary | ICD-10-CM | POA: Diagnosis not present

## 2016-02-26 DIAGNOSIS — G4733 Obstructive sleep apnea (adult) (pediatric): Secondary | ICD-10-CM

## 2016-02-26 DIAGNOSIS — I6523 Occlusion and stenosis of bilateral carotid arteries: Secondary | ICD-10-CM | POA: Diagnosis not present

## 2016-02-26 NOTE — Telephone Encounter (Signed)
Spoke with pt, states that cardiologist is wanting her cpap machine to be evaluated by [facility].  Pt has not been wearing cpap d/t "air sometimes blowing too much and sometimes not blowing at all".  Pt has not contacted [facility] for service, and cardiologist has not placed order for evaluation.    Most recent download printed off of airview and placed in VS's cubby.  VS please advise.  Thanks!

## 2016-03-01 DIAGNOSIS — I10 Essential (primary) hypertension: Secondary | ICD-10-CM | POA: Diagnosis not present

## 2016-03-01 DIAGNOSIS — Z79899 Other long term (current) drug therapy: Secondary | ICD-10-CM | POA: Diagnosis not present

## 2016-03-01 DIAGNOSIS — R609 Edema, unspecified: Secondary | ICD-10-CM | POA: Diagnosis not present

## 2016-03-01 DIAGNOSIS — M25861 Other specified joint disorders, right knee: Secondary | ICD-10-CM | POA: Diagnosis not present

## 2016-03-01 DIAGNOSIS — L0390 Cellulitis, unspecified: Secondary | ICD-10-CM | POA: Diagnosis not present

## 2016-03-01 DIAGNOSIS — R0602 Shortness of breath: Secondary | ICD-10-CM | POA: Diagnosis not present

## 2016-03-01 NOTE — Telephone Encounter (Signed)
CPAP 10/14/15 to 01/11/16 >> used on 13 of 90 nights with average 4 hrs 18 min.  Average AHI 7 with CPAP 8 cm H2O.  Significant air leak.   Please send order for her DME to assess her mask fit and assess function of her CPAP machine.

## 2016-03-01 NOTE — Telephone Encounter (Signed)
LMTCB x 1 

## 2016-03-03 IMAGING — CT CT ABD-PELV W/ CM
1 of 3 series · 14 of 32 positions shown, 19 images · IV contrast (omnipaque)
Comparison: CT abdomen pelvis 05/12/2015

CLINICAL DATA: Patient with left lower and right lower quadrant
abdominal pain radiating to the back for 1 week. Nausea. No reported
vomiting.

EXAM:
CT ABDOMEN AND PELVIS WITH CONTRAST
TECHNIQUE: Multidetector CT imaging of the abdomen and pelvis was performed
using the standard protocol following bolus administration of
intravenous contrast.
CONTRAST:  100mL OMNIPAQUE IOHEXOL 300 MG/ML  SOLN

[Series 2: routine abd pel with · axial · 0.94mm/px · z∈[-474,-49]mm · 14 of 97 slices shown, 19 images]
[im 6/97  soft-tissue]
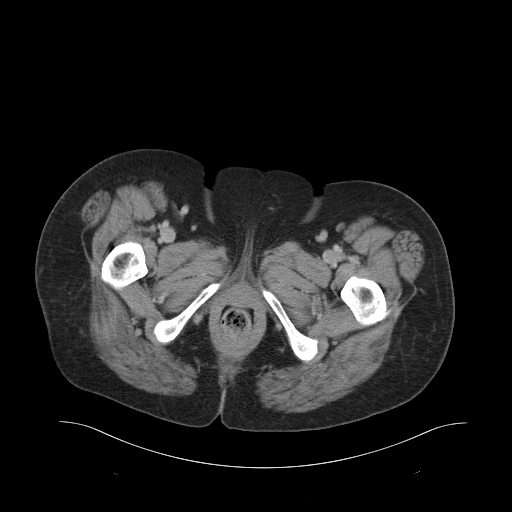
[im 6/97  bone]
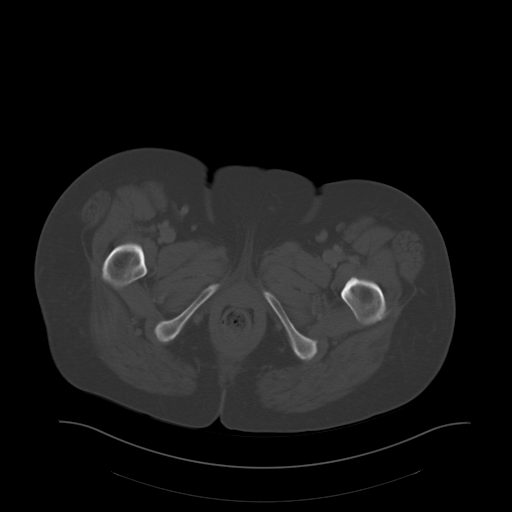
[im 16/97  soft-tissue]
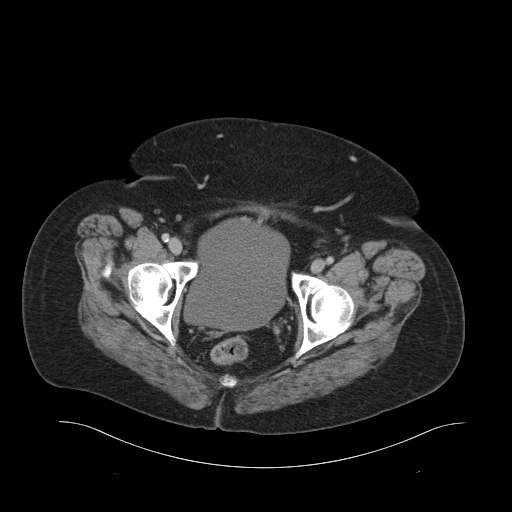
[im 21/97  soft-tissue]
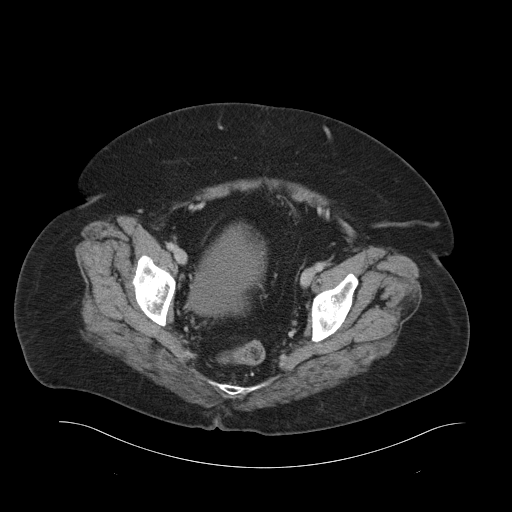
[im 26/97  soft-tissue]
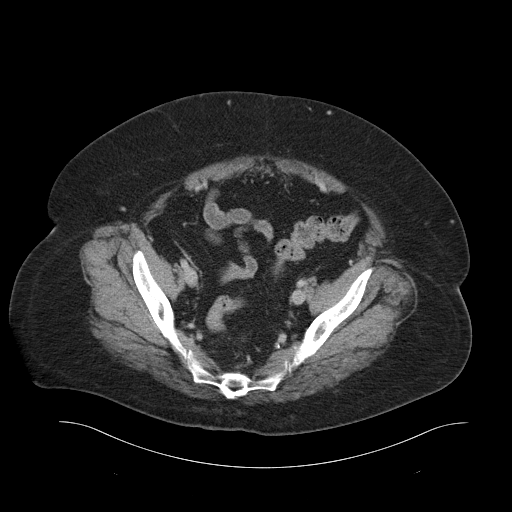
[im 36/97  soft-tissue]
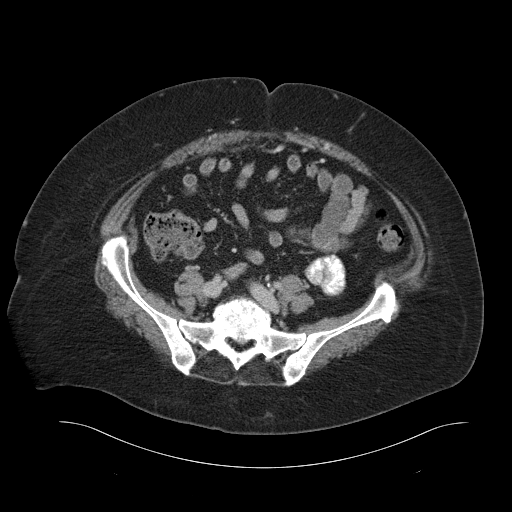
[im 41/97  soft-tissue]
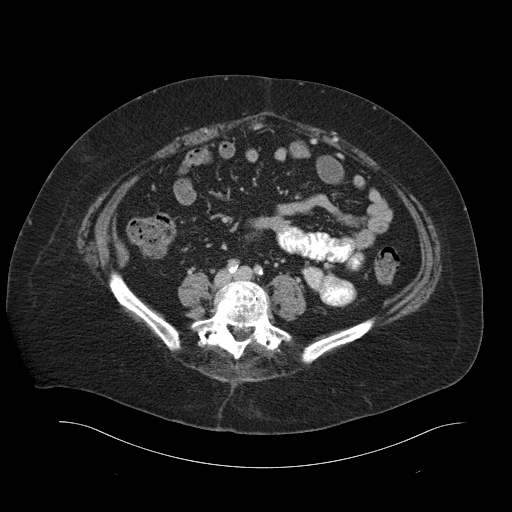
[im 51/97  soft-tissue]
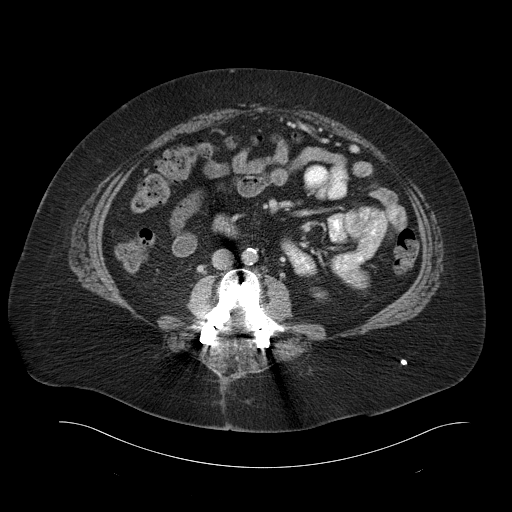
[im 56/97  soft-tissue]
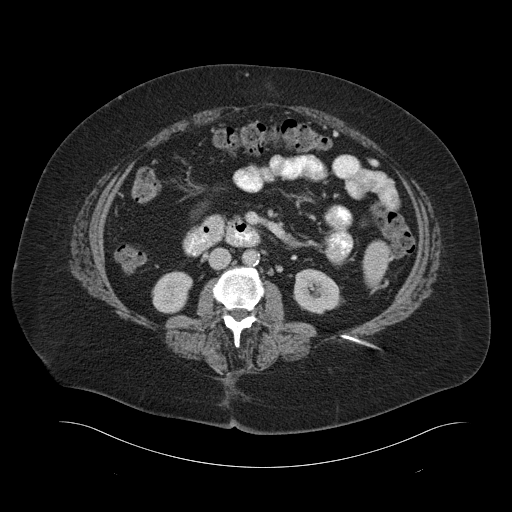
[im 61/97  soft-tissue]
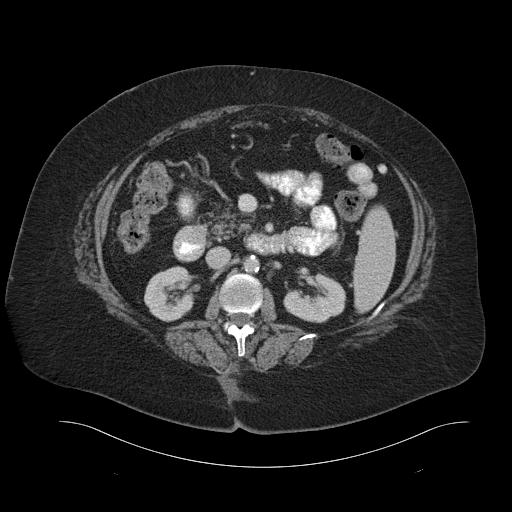
[im 61/97  bone]
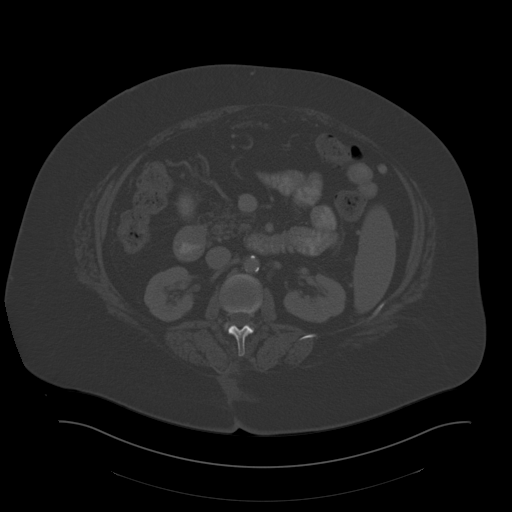
[im 71/97  soft-tissue]
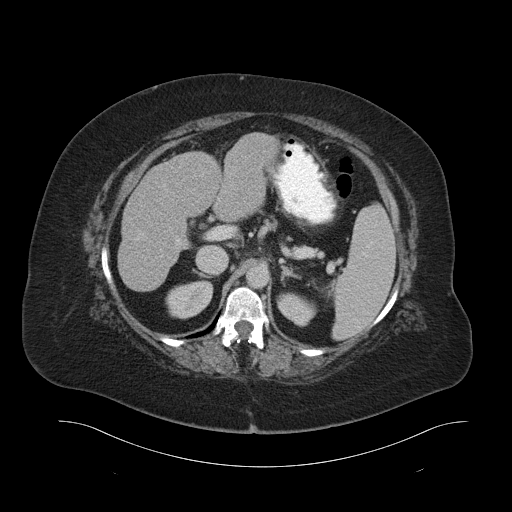
[im 76/97  soft-tissue]
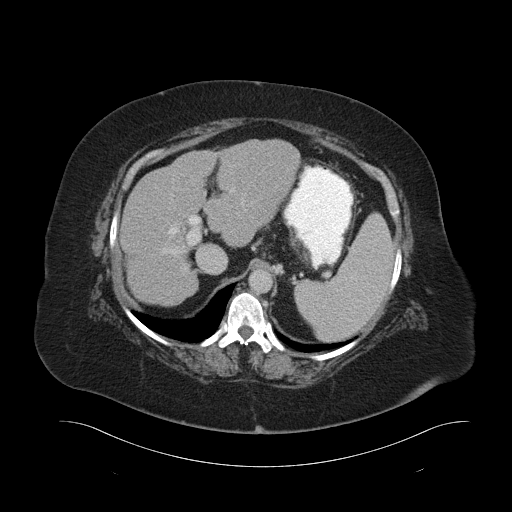
[im 76/97  lung]
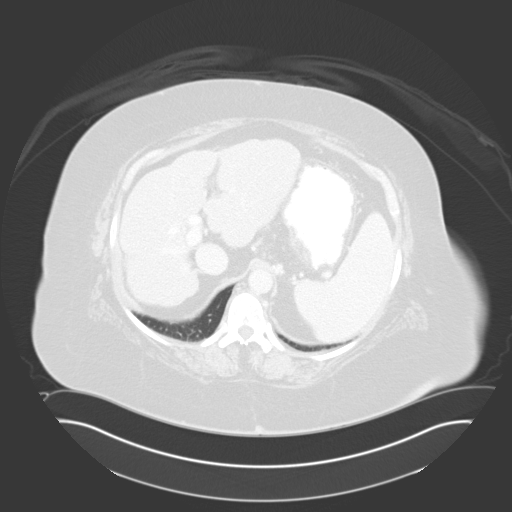
[im 81/97  soft-tissue]
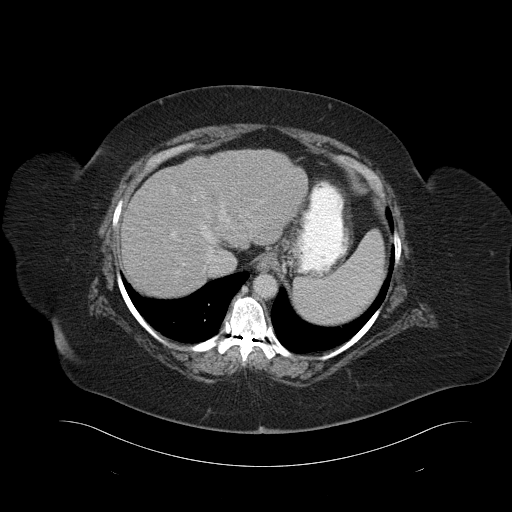
[im 81/97  lung]
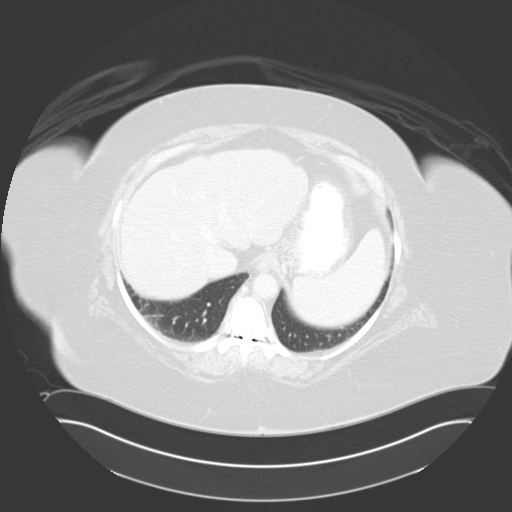
[im 86/97  lung]
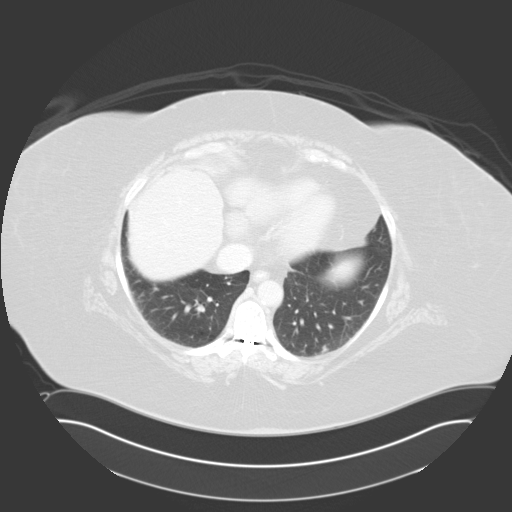
[im 91/97  soft-tissue]
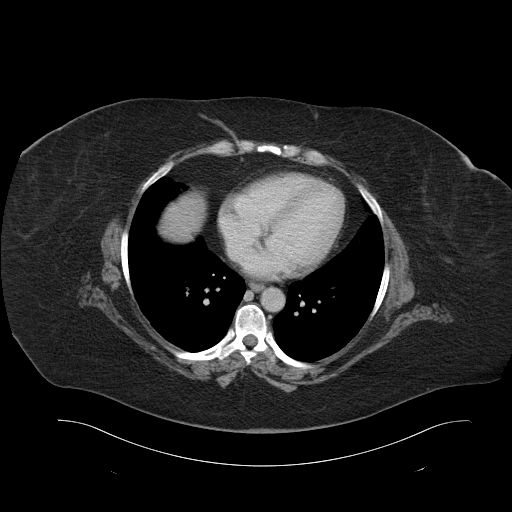
[im 91/97  lung]
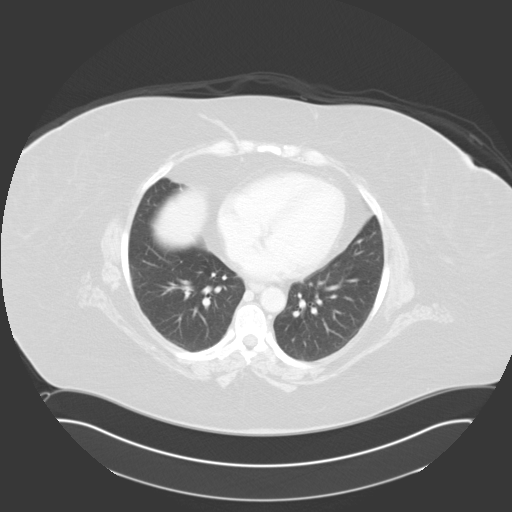

[14 of 32 positions shown; findings below may reference images not displayed]

FINDINGS: Lower chest: Normal heart size. Dependent atelectasis left lower
lobe.

Hepatobiliary: The liver is nodular in contour compatible with
history of cirrhosis. Gallbladder surgically absent. Unchanged
exophytic nodule from the left hepatic lobe.

Pancreas: Pancreatic parenchyma is atrophic.

Spleen: Enlarged measuring 16.5 cm

Adrenals/Urinary Tract: The adrenal glands are normal. Kidneys
enhance symmetrically with contrast. No hydronephrosis. Urinary
bladder is unremarkable.

Stomach/Bowel: No abnormal bowel wall thickening or evidence for
bowel obstruction. No free fluid or free intraperitoneal air.
Multiple dilated mesenteric vessels are demonstrated from a
splenomesenteric shunt.

Vascular/Lymphatic: Normal caliber abdominal aorta. No
retroperitoneal lymphadenopathy.

Other: Status posthysterectomy.

Musculoskeletal: No aggressive or acute appearing osseous lesions.
Lumbar spine degenerative changes. Lower lumbar spinal fusion
hardware. Dorsal column stimulator the lower thoracic level.
IMPRESSION: No acute process within the abdomen or pelvis.

Cirrhosis and portal venous hypertension.

## 2016-03-03 NOTE — Telephone Encounter (Signed)
Pt returned call. Informed her of Vs' recs. Pt voiced understanding and had no further questions. Order placed. Nothing further needed.

## 2016-03-03 NOTE — Telephone Encounter (Signed)
LVM for pt to return call

## 2016-03-05 DIAGNOSIS — E119 Type 2 diabetes mellitus without complications: Secondary | ICD-10-CM | POA: Diagnosis not present

## 2016-03-05 DIAGNOSIS — G4733 Obstructive sleep apnea (adult) (pediatric): Secondary | ICD-10-CM | POA: Diagnosis not present

## 2016-03-05 DIAGNOSIS — J45998 Other asthma: Secondary | ICD-10-CM | POA: Diagnosis not present

## 2016-03-05 DIAGNOSIS — J449 Chronic obstructive pulmonary disease, unspecified: Secondary | ICD-10-CM | POA: Diagnosis not present

## 2016-03-09 ENCOUNTER — Emergency Department: Payer: Medicare Other

## 2016-03-09 ENCOUNTER — Emergency Department
Admission: EM | Admit: 2016-03-09 | Discharge: 2016-03-10 | Disposition: A | Discharge status: Home / Self Care | Payer: Medicare Other | Attending: Emergency Medicine | Admitting: Emergency Medicine

## 2016-03-09 DIAGNOSIS — M79604 Pain in right leg: Secondary | ICD-10-CM

## 2016-03-09 DIAGNOSIS — Z7401 Bed confinement status: Secondary | ICD-10-CM | POA: Diagnosis not present

## 2016-03-09 DIAGNOSIS — R600 Localized edema: Secondary | ICD-10-CM | POA: Diagnosis not present

## 2016-03-09 DIAGNOSIS — L03116 Cellulitis of left lower limb: Secondary | ICD-10-CM | POA: Diagnosis not present

## 2016-03-09 DIAGNOSIS — Z7984 Long term (current) use of oral hypoglycemic drugs: Secondary | ICD-10-CM | POA: Insufficient documentation

## 2016-03-09 DIAGNOSIS — Z79899 Other long term (current) drug therapy: Secondary | ICD-10-CM | POA: Diagnosis not present

## 2016-03-09 DIAGNOSIS — L03115 Cellulitis of right lower limb: Secondary | ICD-10-CM | POA: Diagnosis not present

## 2016-03-09 DIAGNOSIS — R079 Chest pain, unspecified: Secondary | ICD-10-CM | POA: Diagnosis not present

## 2016-03-09 DIAGNOSIS — R531 Weakness: Secondary | ICD-10-CM | POA: Diagnosis not present

## 2016-03-09 DIAGNOSIS — E119 Type 2 diabetes mellitus without complications: Secondary | ICD-10-CM | POA: Insufficient documentation

## 2016-03-09 DIAGNOSIS — R6889 Other general symptoms and signs: Secondary | ICD-10-CM | POA: Diagnosis not present

## 2016-03-09 DIAGNOSIS — Z87891 Personal history of nicotine dependence: Secondary | ICD-10-CM | POA: Insufficient documentation

## 2016-03-09 DIAGNOSIS — J45909 Unspecified asthma, uncomplicated: Secondary | ICD-10-CM | POA: Diagnosis not present

## 2016-03-09 LAB — BASIC METABOLIC PANEL
Anion gap: 9 (ref 5–15)
BUN: 20 mg/dL (ref 6–20)
CO2: 28 mmol/L (ref 22–32)
CREATININE: 1.07 mg/dL — AB (ref 0.44–1.00)
Calcium: 9 mg/dL (ref 8.9–10.3)
Chloride: 104 mmol/L (ref 101–111)
GFR, EST NON AFRICAN AMERICAN: 53 mL/min — AB (ref >60)
Glucose, Bld: 256 mg/dL — ABNORMAL HIGH (ref 65–99)
POTASSIUM: 3.5 mmol/L (ref 3.5–5.1)
SODIUM: 141 mmol/L (ref 135–145)

## 2016-03-09 LAB — CBC WITH DIFFERENTIAL/PLATELET
BASOS ABS: 0 10*3/uL (ref 0–0.1)
Basophils Relative: 1 %
EOS ABS: 0.1 10*3/uL (ref 0–0.7)
EOS PCT: 1 %
HCT: 36.6 % (ref 35.0–47.0)
Hemoglobin: 12.2 g/dL (ref 12.0–16.0)
LYMPHS PCT: 18 %
Lymphs Abs: 1.1 10*3/uL (ref 1.0–3.6)
MCH: 30.7 pg (ref 26.0–34.0)
MCHC: 33.2 g/dL (ref 32.0–36.0)
MCV: 92.5 fL (ref 80.0–100.0)
Monocytes Absolute: 0.5 10*3/uL (ref 0.2–0.9)
Monocytes Relative: 8 %
Neutro Abs: 4.3 10*3/uL (ref 1.4–6.5)
Neutrophils Relative %: 72 %
PLATELETS: 95 10*3/uL — AB (ref 150–440)
RBC: 3.96 MIL/uL (ref 3.80–5.20)
RDW: 16.6 % — ABNORMAL HIGH (ref 11.5–14.5)
WBC: 6 10*3/uL (ref 3.6–11.0)

## 2016-03-09 MED ORDER — CEPHALEXIN 500 MG PO CAPS: 500.0000 mg | ORAL_CAPSULE | Freq: Three times a day (TID) | ORAL | 0 refills | Status: DC

## 2016-03-09 MED ORDER — KETOROLAC TROMETHAMINE 30 MG/ML IJ SOLN
15.0000 mg | INTRAMUSCULAR | Status: AC
  Administered 2016-03-09: 15 mg via INTRAVENOUS
  Filled 2016-03-09: qty 1

## 2016-03-09 NOTE — ED Provider Notes (Signed)
[facility] Emergency Department Provider Note  ____________________________________________  Time seen: Approximately 10:00 PM  I have reviewed the triage vital signs and the nursing notes.   HISTORY  Chief Complaint Leg Pain    HPI Stephanie [name] is a 65 y.o. female was recently treated for cellulitis of both lower legs. She feels like it hasn't improved with antibiotics and is back and worse. She complains of Almyra Free worsened right leg swelling and redness and pain. No significant chest pain or shortness of breath. Does complain of knee pain as well without any recent injuries.     Past Medical History:  Diagnosis Date  . Adenomatous colon polyp 08/27/2014   Polyps x 3  . Anemia   . Anxiety   . 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 Arthritis   . Asthma   . Bipolar affective disorder ([place])    h/o  . Blood transfusion 2000  . Blood transfusion without reported diagnosis   . Chronic abdominal pain    abdominal wall pain  . 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 Colon polyp   . Depression   . Diabetes mellitus without complication ([place])   . Fatty liver 04/09/08   found in abd CT  . GERD (gastroesophageal reflux disease)   . H/O: CVA (cardiovascular accident)    TIA  . H/O: rheumatic fever   . Hyperlipidemia   . Incontinence   . Insomnia   . Obstructive sleep apnea    not using CPAP, last sleep study 2.5 years ago, per pt doctor is aware  . Platelets decreased ([place])   . RLS (restless legs syndrome)   . Shortness of breath   . Sleep apnea   . Stroke [facility])    TIA- 2002  . Ulcer ([place])   . Unspecified disorders of nervous system      Patient Active Problem List   Diagnosis Date Noted  . Iron deficiency anemia 01/30/2016  . Hypoglycemia 09/09/2015  . Hyperglycemia 07/16/2015  . Neuropathy ([place]) 06/08/2015  . Thrombocytopenia ([place]) 08/19/2014  . Abdominal pain, chronic, epigastric 06/12/2014  . Liver cirrhosis secondary to NASH  ([place]) 02/27/2014  . Dizziness and giddiness 10/30/2013  . DOE (dyspnea on exertion) 10/30/2013  . Poorly controlled type 2 diabetes mellitus with complication ([place]) 16/03/9603  . DNR (do not resuscitate) 07/29/2013  . Encounter for routine gynecological examination 07/29/2013  . Dyspnea 07/10/2013  . Lung nodule 07/10/2013  . Varicose veins of lower extremities with other complications 54/02/8118  . GERD (gastroesophageal reflux disease) 10/09/2012  . Cirrhosis of liver ([place]) 02/02/2012  . Anxiety 07/11/2011  . Aortic dissection ([place])   . BACK PAIN, LUMBAR, CHRONIC 11/19/2009  . UNS ADVRS EFF OTH RX MEDICINAL&BIOLOGICAL SBSTNC 12/16/2008  . UNSPECIFIED VITAMIN D DEFICIENCY 09/26/2008  . TOBACCO ABUSE 06/19/2008  . RESTLESS LEGS SYNDROME 04/27/2007  . INSOMNIA 04/27/2007  . HLD (hyperlipidemia) 03/08/2007  . OSA (obstructive sleep apnea) 03/08/2007  . Essential hypertension 03/08/2007  . Asthma 03/08/2007  . BIPOLAR AFFECTIVE DISORDER, HX OF 03/08/2007     Past Surgical History:  Procedure Laterality Date  . ABDOMINAL EXPLORATION SURGERY    . ABDOMINAL HYSTERECTOMY     total  . Axillary artery cannulation via 8-mm Hemashield graft, median sternotomy, extracorporeal circulation with deep hypothermic circulatory arrest, repair of  aortic dessection  01/23/2009   Dr Arlyce Dice  . BACK SURGERY     x 45  . cataract  both eyes  . CHOLECYSTECTOMY    . COLONOSCOPY    . EYE SURGERY     bilat  . HEMORROIDECTOMY     and colon polyp removed  . LIVER BIOPSY  04/10/2012   Procedure: LIVER BIOPSY;  Surgeon: Inda Castle, MD;  Location: [facility];  Service: Endoscopy;  Laterality: N/A;  ultrasound to mark order for abd limited/liver to be marked to be sent by [email or web address]   . LUMBAR FUSION  10/09  . LUMBAR WOUND DEBRIDEMENT  05/09/2011   Procedure: LUMBAR WOUND DEBRIDEMENT;  Surgeon: Dahlia Bailiff;  Location: [place];  Service: Orthopedics;  Laterality: N/A;  IRRIGATION AND  DEBRIDEMENT SPINAL WOUND  . POSTERIOR CERVICAL FUSION/FORAMINOTOMY    . ROTATOR CUFF REPAIR     left     Prior to Admission medications   Medication Sig Start Date End Date Taking? Authorizing Provider  albuterol (PROAIR HFA) 108 (90 Base) MCG/ACT inhaler Inhale 2 puffs into the lungs See admin instructions. Inhale 2 puffs every 4 to 6 hours as needed for cough, wheeze or chest congestion.    Historical Provider, MD  albuterol (VENTOLIN HFA) 108 (90 Base) MCG/ACT inhaler Inhale 2 puffs into the lungs every 6 (six) hours as needed for wheezing or shortness of breath. 12/24/15   Chesley Mires, MD  ARIPiprazole (ABILIFY) 30 MG tablet Take 30 mg by mouth daily.    Historical Provider, MD  budesonide-formoterol (SYMBICORT) 160-4.5 MCG/ACT inhaler Inhale 2 puffs into the lungs 2 (two) times daily.    Historical Provider, MD  buPROPion (WELLBUTRIN SR) 150 MG 12 hr tablet Take 150 mg by mouth daily.    Historical Provider, MD  celecoxib (CELEBREX) 100 MG capsule Take 100 mg by mouth daily.    Historical Provider, MD  cephALEXin (KEFLEX) 500 MG capsule Take 1 capsule (500 mg total) by mouth 3 (three) times daily. 03/09/16   Carrie Mew, MD  clindamycin (CLEOCIN) 300 MG capsule Take 1 capsule (300 mg total) by mouth 3 (three) times daily. 01/31/16   Delman Kitten, MD  clonazePAM (KLONOPIN) 0.5 MG tablet Take 0.5 mg by mouth 2 (two) times daily.    Historical Provider, MD  dicyclomine (BENTYL) 10 MG capsule Take 10 mg by mouth 3 (three) times daily.     Historical Provider, MD  docusate sodium (COLACE) 100 MG capsule Take 100 mg by mouth 2 (two) times daily.    Historical Provider, MD  famotidine (PEPCID) 20 MG tablet Take 20 mg by mouth 2 (two) times daily.    Historical Provider, MD  ferrous sulfate 325 (65 FE) MG tablet Take 325 mg by mouth 2 (two) times daily.     Historical Provider, MD  furosemide (LASIX) 80 MG tablet Take 80 mg by mouth daily.    Historical Provider, MD  gabapentin (NEURONTIN) 600 MG  tablet Take 600 mg by mouth 2 (two) times daily.     Historical Provider, MD  insulin lispro (HUMALOG) 100 UNIT/ML injection Inject 0-9 Units into the skin 3 (three) times daily before meals. Pt uses as needed per sliding scale:    150-200:  0 units  201-250:  5 units  251-300:  8 units 301-350:  9 units  Greater than 350:  Call MD    Historical Provider, MD  lactulose (CHRONULAC) 10 GM/15ML solution Take 10 g by mouth daily.    Historical Provider, MD  lamoTRIgine (LAMICTAL) 100 MG tablet Take 100 mg by mouth 2 (two) times daily.    Historical  Provider, MD  loperamide (IMODIUM) 2 MG capsule Take 4 mg by mouth as needed for diarrhea or loose stools. *Do not exceed 8 doses in 24 hours*    Historical Provider, MD  LORazepam (ATIVAN) 0.5 MG tablet Take 0.5 mg by mouth daily as needed for anxiety.     Historical Provider, MD  metFORMIN (GLUCOPHAGE) 500 MG tablet Take 500-1,000 mg by mouth See admin instructions. Take 2 tablets (1000 mg) by mouth every morning with meal, and take 1 tablet by mouth every evening at 5 pm with meal.    Historical Provider, MD  metoCLOPramide (REGLAN) 10 MG tablet Take 1 tablet (10 mg total) by mouth 4 (four) times daily -  before meals and at bedtime. Patient not taking: Reported on 01/31/2016 12/09/15   Carrie Mew, MD  metoCLOPramide (REGLAN) 5 MG tablet Take 5 mg by mouth 4 (four) times daily.    Historical Provider, MD  metoprolol tartrate (LOPRESSOR) 25 MG tablet Take 25 mg by mouth 2 (two) times daily.    Historical Provider, MD  Multiple Vitamin (MULTIVITAMIN WITH MINERALS) TABS tablet Take 1 tablet by mouth daily with breakfast.     Historical Provider, MD  omeprazole (PRILOSEC) 40 MG capsule Take 40 mg by mouth daily before breakfast.    Historical Provider, MD  oxybutynin (DITROPAN-XL) 5 MG 24 hr tablet Take 5 mg by mouth daily.    Historical Provider, MD  oxyCODONE (OXY IR/ROXICODONE) 5 MG immediate release tablet Take 5 mg by mouth every 12 (twelve) hours.      Historical Provider, MD  potassium chloride (K-DUR,KLOR-CON) 10 MEQ tablet Take 10 mEq by mouth every Monday, Wednesday, and Friday. *Note dose*    Historical Provider, MD  potassium chloride SA (K-DUR,KLOR-CON) 20 MEQ tablet Take 20 mEq by mouth daily. 11/16/15 01/29/16  Historical Provider, MD  potassium chloride SA (K-DUR,KLOR-CON) 20 MEQ tablet Take 20 mEq by mouth daily. *Note dose*    Historical Provider, MD  pregabalin (LYRICA) 75 MG capsule Take 75 mg by mouth 3 (three) times daily.    Historical Provider, MD  rOPINIRole (REQUIP) 3 MG tablet Take 3 mg by mouth at bedtime.    Historical Provider, MD  senna (SENOKOT) 8.6 MG tablet Take 1 tablet by mouth 3 (three) times a week. Take on Monday, Wednesday, and Friday. *Hold for loose stools*    Historical Provider, MD  simvastatin (ZOCOR) 10 MG tablet Take 10 mg by mouth at bedtime.     Historical Provider, MD  sucralfate (CARAFATE) 1 g tablet Take 1 tablet (1 g total) by mouth 4 (four) times daily. 12/09/15   Carrie Mew, MD  traMADol (ULTRAM) 50 MG tablet Take 50 mg by mouth every 6 (six) hours as needed for moderate pain.    Historical Provider, MD  vitamin C (ASCORBIC ACID) 500 MG tablet Take 500 mg by mouth 2 (two) times daily.    Historical Provider, MD  zolpidem (AMBIEN) 5 MG tablet Take 5 mg by mouth at bedtime.     Historical Provider, MD     Allergies Doxycycline   Family History  Problem Relation Age of Onset  . Breast cancer Mother   . Lung cancer Mother   . Stomach cancer Father   . Diabetes      4 aunts, and 1 uncle  . Colon cancer Neg Hx   . Esophageal cancer Neg Hx   . Rectal cancer Neg Hx     Social History Social History  Substance Use  Topics  . Smoking status: Former Smoker    Packs/day: 1.50    Years: 50.00    Types: Cigarettes    Quit date: 04/06/2013  . Smokeless tobacco: Never Used  . Alcohol use No    Review of Systems  Constitutional:   No fever or chills.  ENT:   No sore throat. No  rhinorrhea. Cardiovascular:   No chest pain. Respiratory:   No dyspnea or cough. Gastrointestinal:   Negative for abdominal pain, vomiting and diarrhea.   Musculoskeletal:   Positive right leg swelling redness and pain. 10-point ROS otherwise negative.  ____________________________________________   PHYSICAL EXAM:  VITAL SIGNS: ED Triage Vitals  Enc Vitals Group     BP 03/09/16 2022 (!) 125/57     Pulse Rate 03/09/16 2022 97     Resp 03/09/16 2022 18     Temp 03/09/16 2022 98.3 F (36.8 C)     Temp Source 03/09/16 2022 Oral     SpO2 03/09/16 2022 95 %     Weight 03/09/16 2023 224 lb (101.6 kg)     Height 03/09/16 2023 5' (1.524 m)     Head Circumference --      Peak Flow --      Pain Score 03/09/16 2023 10     Pain Loc --      Pain Edu? --      Excl. in [place]? --     Vital signs reviewed, nursing assessments reviewed.   Constitutional:   Alert and oriented. Well appearing and in no distress. Eyes:   No scleral icterus. No conjunctival pallor. PERRL. EOMI.  No nystagmus. ENT   Head:   Normocephalic and atraumatic.   Nose:   No congestion/rhinnorhea. No septal hematoma   Mouth/Throat:   MMM, no pharyngeal erythema. No peritonsillar mass.    Neck:   No stridor. No SubQ emphysema. No meningismus. Hematological/Lymphatic/Immunilogical:   No cervical lymphadenopathy. Cardiovascular:   RRR. Symmetric bilateral radial and DP pulses.  No murmurs.  Respiratory:   Normal respiratory effort without tachypnea nor retractions. Breath sounds are clear and equal bilaterally. No wheezes/rales/rhonchi. Gastrointestinal:   Soft and nontender. Non distended. There is no CVA tenderness.  No rebound, rigidity, or guarding. Genitourinary:   deferred Musculoskeletal:   Right knee is nontender. Intact range of motion. There is a joint effusion on exam. She has calf tenderness on the right leg as well, in addition to some 1+ pitting edema and diffuse faint erythema compared to the left  leg. Left leg is unremarkable on exam. Neurologic:   Normal speech and language.  CN 2-10 normal. Motor grossly intact. No gross focal neurologic deficits are appreciated.  Skin:    Skin is warm, dry and intact. Erythema on the right distal leg as above.  ____________________________________________    LABS (pertinent positives/negatives) (all labs ordered are listed, but only abnormal results are displayed) Labs Reviewed  BASIC METABOLIC PANEL - Abnormal; Notable for the following:       Result Value   Glucose, Bld 256 (*)    Creatinine, Ser 1.07 (*)    GFR calc non Af Amer 53 (*)    All other components within normal limits  CBC WITH DIFFERENTIAL/PLATELET - Abnormal; Notable for the following:    RDW 16.6 (*)    Platelets 95 (*)    All other components within normal limits   ____________________________________________   EKG  Interpreted by me Sinus rhythm rate of 95, normal axis intervals QRS ST  segments and T waves. One PVC on the strip.  ____________________________________________    RADIOLOGY  Ultrasound bilateral lower extremities unremarkable Chest x-ray unremarkable X-ray right knee unremarkable  ____________________________________________   PROCEDURES Procedures  ____________________________________________   INITIAL IMPRESSION / ASSESSMENT AND PLAN / ED COURSE  Pertinent labs & imaging results that were available during my care of the patient were reviewed by me and considered in my medical decision making (see chart for details).  Patient presents with right leg pain and swelling despite recent treatment for cellulitis with antibiotics. We'll obtain ultrasound to ensure this isn't a DVT. Check labs. X-ray of the chest and right knee.     Clinical Course    ----------------------------------------- 11:16 PM on 03/09/2016 -----------------------------------------  Workup negative. Patient recently completed a course of clindamycin, we'll  do a trial of Keflex while having the patient follow up with primary care. ____________________________________________   FINAL CLINICAL IMPRESSION(S) / ED DIAGNOSES  Final diagnoses:  Right leg pain  Cellulitis of right lower extremity       Portions of this note were generated with dragon dictation software. Dictation errors may occur despite best attempts at proofreading.    Carrie Mew, MD 03/09/16 [phone]

## 2016-03-09 NOTE — ED Triage Notes (Signed)
Pt reports being treated for several weeks for bilateral cellulitis in lower legs.  Today patient coming in for increased pain and swelling to R leg and some pain in L leg.  R leg noted to be more swollen, red, and warm to touch.  Bilateral pedal pulses strong. Pt has hx of diabetes as well.  Pt not currently on antibiotics, but states that previous antibiotics did not help.

## 2016-03-10 DIAGNOSIS — E119 Type 2 diabetes mellitus without complications: Secondary | ICD-10-CM | POA: Diagnosis not present

## 2016-03-10 DIAGNOSIS — M25569 Pain in unspecified knee: Secondary | ICD-10-CM | POA: Diagnosis not present

## 2016-03-10 DIAGNOSIS — I10 Essential (primary) hypertension: Secondary | ICD-10-CM | POA: Diagnosis not present

## 2016-03-10 DIAGNOSIS — R600 Localized edema: Secondary | ICD-10-CM | POA: Diagnosis not present

## 2016-03-15 DIAGNOSIS — M2391 Unspecified internal derangement of right knee: Secondary | ICD-10-CM | POA: Diagnosis not present

## 2016-03-15 DIAGNOSIS — M25561 Pain in right knee: Secondary | ICD-10-CM | POA: Diagnosis not present

## 2016-03-17 DIAGNOSIS — E119 Type 2 diabetes mellitus without complications: Secondary | ICD-10-CM | POA: Diagnosis not present

## 2016-03-17 DIAGNOSIS — M25569 Pain in unspecified knee: Secondary | ICD-10-CM | POA: Diagnosis not present

## 2016-03-17 DIAGNOSIS — G629 Polyneuropathy, unspecified: Secondary | ICD-10-CM | POA: Diagnosis not present

## 2016-03-17 DIAGNOSIS — R269 Unspecified abnormalities of gait and mobility: Secondary | ICD-10-CM | POA: Diagnosis not present

## 2016-03-22 DIAGNOSIS — Z79899 Other long term (current) drug therapy: Secondary | ICD-10-CM | POA: Diagnosis not present

## 2016-03-23 DIAGNOSIS — J449 Chronic obstructive pulmonary disease, unspecified: Secondary | ICD-10-CM | POA: Diagnosis not present

## 2016-03-23 DIAGNOSIS — K5900 Constipation, unspecified: Secondary | ICD-10-CM | POA: Diagnosis not present

## 2016-03-23 DIAGNOSIS — R54 Age-related physical debility: Secondary | ICD-10-CM | POA: Diagnosis not present

## 2016-03-23 DIAGNOSIS — D649 Anemia, unspecified: Secondary | ICD-10-CM | POA: Diagnosis not present

## 2016-03-23 DIAGNOSIS — Z23 Encounter for immunization: Secondary | ICD-10-CM | POA: Diagnosis not present

## 2016-03-24 DIAGNOSIS — R600 Localized edema: Secondary | ICD-10-CM | POA: Diagnosis not present

## 2016-03-24 DIAGNOSIS — R269 Unspecified abnormalities of gait and mobility: Secondary | ICD-10-CM | POA: Diagnosis not present

## 2016-03-24 DIAGNOSIS — M25561 Pain in right knee: Secondary | ICD-10-CM | POA: Diagnosis not present

## 2016-03-24 DIAGNOSIS — Z79899 Other long term (current) drug therapy: Secondary | ICD-10-CM | POA: Diagnosis not present

## 2016-03-24 DIAGNOSIS — E119 Type 2 diabetes mellitus without complications: Secondary | ICD-10-CM | POA: Diagnosis not present

## 2016-04-04 ENCOUNTER — Emergency Department: Payer: Medicare Other

## 2016-04-04 ENCOUNTER — Observation Stay
Admission: EM | Admit: 2016-04-04 | Discharge: 2016-04-05 | Disposition: A | Discharge status: Skilled Nursing Facility | Payer: Medicare Other | Attending: Internal Medicine | Admitting: Internal Medicine

## 2016-04-04 ENCOUNTER — Encounter: Payer: Self-pay | Admitting: Medical Oncology

## 2016-04-04 DIAGNOSIS — E785 Hyperlipidemia, unspecified: Secondary | ICD-10-CM | POA: Insufficient documentation

## 2016-04-04 DIAGNOSIS — R0789 Other chest pain: Secondary | ICD-10-CM | POA: Diagnosis not present

## 2016-04-04 DIAGNOSIS — N3000 Acute cystitis without hematuria: Secondary | ICD-10-CM | POA: Diagnosis not present

## 2016-04-04 DIAGNOSIS — Z6841 Body Mass Index (BMI) 40.0 and over, adult: Secondary | ICD-10-CM | POA: Diagnosis not present

## 2016-04-04 DIAGNOSIS — F319 Bipolar disorder, unspecified: Secondary | ICD-10-CM | POA: Insufficient documentation

## 2016-04-04 DIAGNOSIS — R300 Dysuria: Secondary | ICD-10-CM | POA: Diagnosis not present

## 2016-04-04 DIAGNOSIS — G2581 Restless legs syndrome: Secondary | ICD-10-CM | POA: Diagnosis not present

## 2016-04-04 DIAGNOSIS — R0689 Other abnormalities of breathing: Secondary | ICD-10-CM | POA: Diagnosis not present

## 2016-04-04 DIAGNOSIS — F419 Anxiety disorder, unspecified: Secondary | ICD-10-CM | POA: Insufficient documentation

## 2016-04-04 DIAGNOSIS — K7460 Unspecified cirrhosis of liver: Secondary | ICD-10-CM | POA: Insufficient documentation

## 2016-04-04 DIAGNOSIS — D696 Thrombocytopenia, unspecified: Secondary | ICD-10-CM | POA: Insufficient documentation

## 2016-04-04 DIAGNOSIS — G8929 Other chronic pain: Secondary | ICD-10-CM | POA: Insufficient documentation

## 2016-04-04 DIAGNOSIS — Z794 Long term (current) use of insulin: Secondary | ICD-10-CM | POA: Insufficient documentation

## 2016-04-04 DIAGNOSIS — Z87891 Personal history of nicotine dependence: Secondary | ICD-10-CM | POA: Diagnosis not present

## 2016-04-04 DIAGNOSIS — R51 Headache: Secondary | ICD-10-CM | POA: Diagnosis not present

## 2016-04-04 DIAGNOSIS — R079 Chest pain, unspecified: Secondary | ICD-10-CM | POA: Diagnosis present

## 2016-04-04 DIAGNOSIS — G4733 Obstructive sleep apnea (adult) (pediatric): Secondary | ICD-10-CM | POA: Diagnosis not present

## 2016-04-04 DIAGNOSIS — E669 Obesity, unspecified: Secondary | ICD-10-CM | POA: Diagnosis not present

## 2016-04-04 DIAGNOSIS — M1990 Unspecified osteoarthritis, unspecified site: Secondary | ICD-10-CM | POA: Diagnosis not present

## 2016-04-04 DIAGNOSIS — K7581 Nonalcoholic steatohepatitis (NASH): Secondary | ICD-10-CM | POA: Diagnosis not present

## 2016-04-04 DIAGNOSIS — E119 Type 2 diabetes mellitus without complications: Secondary | ICD-10-CM | POA: Diagnosis not present

## 2016-04-04 DIAGNOSIS — Z8673 Personal history of transient ischemic attack (TIA), and cerebral infarction without residual deficits: Secondary | ICD-10-CM | POA: Diagnosis not present

## 2016-04-04 DIAGNOSIS — K219 Gastro-esophageal reflux disease without esophagitis: Secondary | ICD-10-CM | POA: Diagnosis not present

## 2016-04-04 DIAGNOSIS — R05 Cough: Secondary | ICD-10-CM | POA: Diagnosis not present

## 2016-04-04 LAB — URINALYSIS COMPLETE WITH MICROSCOPIC (ARMC ONLY)
Bilirubin Urine: NEGATIVE
Glucose, UA: NEGATIVE mg/dL
Hgb urine dipstick: NEGATIVE
Ketones, ur: NEGATIVE mg/dL
Nitrite: POSITIVE — AB
PH: 6 (ref 5.0–8.0)
PROTEIN: NEGATIVE mg/dL
Specific Gravity, Urine: 1.013 (ref 1.005–1.030)

## 2016-04-04 LAB — CBC
HCT: 37.3 % (ref 35.0–47.0)
HEMATOCRIT: 36.8 % (ref 35.0–47.0)
Hemoglobin: 12.5 g/dL (ref 12.0–16.0)
Hemoglobin: 12.6 g/dL (ref 12.0–16.0)
MCH: 31.4 pg (ref 26.0–34.0)
MCH: 31.3 pg (ref 26.0–34.0)
MCHC: 34.1 g/dL (ref 32.0–36.0)
MCHC: 33.6 g/dL (ref 32.0–36.0)
MCV: 92.1 fL (ref 80.0–100.0)
MCV: 93 fL (ref 80.0–100.0)
PLATELETS: 76 10*3/uL — AB (ref 150–440)
PLATELETS: 87 10*3/uL — AB (ref 150–440)
RBC: 3.99 MIL/uL (ref 3.80–5.20)
RBC: 4.02 MIL/uL (ref 3.80–5.20)
RDW: 16.4 % — AB (ref 11.5–14.5)
RDW: 16.3 % — AB (ref 11.5–14.5)
WBC: 7.6 10*3/uL (ref 3.6–11.0)
WBC: 7.9 10*3/uL (ref 3.6–11.0)

## 2016-04-04 LAB — BASIC METABOLIC PANEL
Anion gap: 9 (ref 5–15)
BUN: 22 mg/dL — AB (ref 6–20)
CO2: 30 mmol/L (ref 22–32)
CREATININE: 1.05 mg/dL — AB (ref 0.44–1.00)
Calcium: 9.2 mg/dL (ref 8.9–10.3)
Chloride: 100 mmol/L — ABNORMAL LOW (ref 101–111)
GFR calc Af Amer: >60 mL/min (ref >60)
GFR, EST NON AFRICAN AMERICAN: 55 mL/min — AB (ref >60)
GLUCOSE: 160 mg/dL — AB (ref 65–99)
POTASSIUM: 3.1 mmol/L — AB (ref 3.5–5.1)
Sodium: 139 mmol/L (ref 135–145)

## 2016-04-04 LAB — CREATININE, SERUM
Creatinine, Ser: 1.06 mg/dL — ABNORMAL HIGH (ref 0.44–1.00)
GFR, EST NON AFRICAN AMERICAN: 54 mL/min — AB (ref >60)

## 2016-04-04 LAB — GLUCOSE, CAPILLARY
Glucose-Capillary: 99 mg/dL (ref 65–99)
Glucose-Capillary: 243 mg/dL — ABNORMAL HIGH (ref 65–99)

## 2016-04-04 LAB — TROPONIN I: Troponin I: <0.03 ng/mL (ref <0.03)

## 2016-04-04 LAB — MRSA PCR SCREENING: MRSA by PCR: NEGATIVE

## 2016-04-04 MED ORDER — MAGNESIUM SULFATE 2 GM/50ML IV SOLN
2.0000 g | Freq: Once | INTRAVENOUS | Status: AC
  Administered 2016-04-04: 2 g via INTRAVENOUS
  Filled 2016-04-04: qty 50

## 2016-04-04 MED ORDER — KETOROLAC TROMETHAMINE 15 MG/ML IJ SOLN
15.0000 mg | Freq: Four times a day (QID) | INTRAMUSCULAR | Status: DC | PRN
Start: 2016-04-04 — End: 2016-04-05

## 2016-04-04 MED ORDER — MIRTAZAPINE 15 MG PO TABS: 15.0000 mg | ORAL_TABLET | Freq: Every evening | ORAL | Status: DC | PRN

## 2016-04-04 MED ORDER — DEXAMETHASONE SODIUM PHOSPHATE 10 MG/ML IJ SOLN
10.0000 mg | Freq: Once | INTRAMUSCULAR | Status: AC
  Administered 2016-04-04: 10 mg via INTRAVENOUS
  Filled 2016-04-04: qty 1

## 2016-04-04 MED ORDER — CEFTRIAXONE SODIUM-DEXTROSE 1-3.74 GM-% IV SOLR
1.0000 g | Freq: Once | INTRAVENOUS | Status: AC
  Administered 2016-04-04: 1 g via INTRAVENOUS
  Filled 2016-04-04: qty 50

## 2016-04-04 MED ORDER — ACETAMINOPHEN 650 MG RE SUPP: 650.0000 mg | Freq: Four times a day (QID) | RECTAL | Status: DC | PRN

## 2016-04-04 MED ORDER — ALBUTEROL SULFATE (2.5 MG/3ML) 0.083% IN NEBU: 2.5000 mg | INHALATION_SOLUTION | Freq: Four times a day (QID) | RESPIRATORY_TRACT | Status: DC | PRN

## 2016-04-04 MED ORDER — BUPROPION HCL ER (SR) 150 MG PO TB12
150.0000 mg | ORAL_TABLET | Freq: Every day | ORAL | Status: DC
  Administered 2016-04-05: 150 mg via ORAL
  Filled 2016-04-04 (×2): qty 1

## 2016-04-04 MED ORDER — POTASSIUM CHLORIDE CRYS ER 20 MEQ PO TBCR
20.0000 meq | EXTENDED_RELEASE_TABLET | Freq: Every day | ORAL | Status: DC
  Administered 2016-04-05: 20 meq via ORAL
  Filled 2016-04-04: qty 1

## 2016-04-04 MED ORDER — INSULIN ASPART 100 UNIT/ML ~~LOC~~ SOLN
0.0000 [IU] | Freq: Every day | SUBCUTANEOUS | Status: DC
  Administered 2016-04-04: 2 [IU] via SUBCUTANEOUS
  Filled 2016-04-04: qty 2

## 2016-04-04 MED ORDER — DICYCLOMINE HCL 10 MG PO CAPS
10.0000 mg | ORAL_CAPSULE | Freq: Three times a day (TID) | ORAL | Status: DC
  Administered 2016-04-04 – 2016-04-05 (×2): 10 mg via ORAL
  Filled 2016-04-04 (×3): qty 1

## 2016-04-04 MED ORDER — INSULIN ASPART 100 UNIT/ML ~~LOC~~ SOLN
0.0000 [IU] | Freq: Three times a day (TID) | SUBCUTANEOUS | Status: DC
  Administered 2016-04-05: 2 [IU] via SUBCUTANEOUS
  Administered 2016-04-05: 3 [IU] via SUBCUTANEOUS
  Filled 2016-04-04: qty 2
  Filled 2016-04-04: qty 3

## 2016-04-04 MED ORDER — ARIPIPRAZOLE 15 MG PO TABS
30.0000 mg | ORAL_TABLET | Freq: Every day | ORAL | Status: DC
  Administered 2016-04-04 – 2016-04-05 (×2): 30 mg via ORAL
  Filled 2016-04-04: qty 2
  Filled 2016-04-04: qty 6
  Filled 2016-04-04 (×2): qty 2

## 2016-04-04 MED ORDER — ACETAMINOPHEN 325 MG PO TABS
650.0000 mg | ORAL_TABLET | Freq: Four times a day (QID) | ORAL | Status: DC | PRN
  Administered 2016-04-04: 650 mg via ORAL
  Filled 2016-04-04: qty 2

## 2016-04-04 MED ORDER — SODIUM CHLORIDE 0.9 % IV BOLUS (SEPSIS)
1000.0000 mL | Freq: Once | INTRAVENOUS | Status: AC
  Administered 2016-04-04: 1000 mL via INTRAVENOUS

## 2016-04-04 MED ORDER — ROPINIROLE HCL 1 MG PO TABS
3.0000 mg | ORAL_TABLET | Freq: Every day | ORAL | Status: DC
  Filled 2016-04-04: qty 3

## 2016-04-04 MED ORDER — ZOLPIDEM TARTRATE 5 MG PO TABS
5.0000 mg | ORAL_TABLET | Freq: Every day | ORAL | Status: DC
  Filled 2016-04-04: qty 1

## 2016-04-04 MED ORDER — GABAPENTIN 600 MG PO TABS
600.0000 mg | ORAL_TABLET | Freq: Two times a day (BID) | ORAL | Status: DC
  Filled 2016-04-04: qty 1

## 2016-04-04 MED ORDER — PANTOPRAZOLE SODIUM 40 MG PO TBEC
40.0000 mg | DELAYED_RELEASE_TABLET | Freq: Two times a day (BID) | ORAL | Status: DC
  Administered 2016-04-04: 40 mg via ORAL
  Filled 2016-04-04: qty 1

## 2016-04-04 MED ORDER — FERROUS SULFATE 325 (65 FE) MG PO TABS
325.0000 mg | ORAL_TABLET | Freq: Two times a day (BID) | ORAL | Status: DC
  Administered 2016-04-04: 325 mg via ORAL
  Filled 2016-04-04: qty 1

## 2016-04-04 MED ORDER — CEFTRIAXONE SODIUM-DEXTROSE 1-3.74 GM-% IV SOLR
1.0000 g | INTRAVENOUS | Status: DC
Start: 2016-04-05 — End: 2016-04-05
  Filled 2016-04-04: qty 50

## 2016-04-04 MED ORDER — KETOROLAC TROMETHAMINE 30 MG/ML IJ SOLN
30.0000 mg | Freq: Once | INTRAMUSCULAR | Status: AC
  Administered 2016-04-04: 30 mg via INTRAVENOUS
  Filled 2016-04-04: qty 1

## 2016-04-04 MED ORDER — PREGABALIN 75 MG PO CAPS
150.0000 mg | ORAL_CAPSULE | Freq: Three times a day (TID) | ORAL | Status: DC
  Administered 2016-04-05: 150 mg via ORAL
  Filled 2016-04-04 (×2): qty 2

## 2016-04-04 MED ORDER — SENNA 8.6 MG PO TABS
1.0000 | ORAL_TABLET | ORAL | Status: DC
  Administered 2016-04-04: 8.6 mg via ORAL
  Filled 2016-04-04: qty 1

## 2016-04-04 MED ORDER — DEXTROSE 5 % IV SOLN: 1.0000 g | Freq: Once | INTRAVENOUS | Status: DC

## 2016-04-04 MED ORDER — SIMVASTATIN 10 MG PO TABS
10.0000 mg | ORAL_TABLET | Freq: Every day | ORAL | Status: DC
  Administered 2016-04-04: 10 mg via ORAL
  Filled 2016-04-04 (×2): qty 1

## 2016-04-04 MED ORDER — ADULT MULTIVITAMIN W/MINERALS CH
1.0000 | ORAL_TABLET | Freq: Every day | ORAL | Status: DC
  Administered 2016-04-05: 1 via ORAL
  Filled 2016-04-04: qty 1

## 2016-04-04 MED ORDER — DEXTROSE 5 % IV SOLN: 1.0000 g | INTRAVENOUS | Status: DC

## 2016-04-04 MED ORDER — ENOXAPARIN SODIUM 40 MG/0.4ML ~~LOC~~ SOLN
40.0000 mg | Freq: Two times a day (BID) | SUBCUTANEOUS | Status: DC
  Administered 2016-04-04: 40 mg via SUBCUTANEOUS
  Filled 2016-04-04: qty 0.4

## 2016-04-04 MED ORDER — OXYCODONE HCL 5 MG PO TABS
5.0000 mg | ORAL_TABLET | Freq: Two times a day (BID) | ORAL | Status: DC
  Administered 2016-04-05: 5 mg via ORAL
  Filled 2016-04-04 (×2): qty 1

## 2016-04-04 MED ORDER — POTASSIUM CHLORIDE CRYS ER 20 MEQ PO TBCR
40.0000 meq | EXTENDED_RELEASE_TABLET | Freq: Once | ORAL | Status: AC
  Administered 2016-04-04: 40 meq via ORAL
  Filled 2016-04-04: qty 2

## 2016-04-04 MED ORDER — LACTULOSE 10 GM/15ML PO SOLN
10.0000 g | Freq: Every day | ORAL | Status: DC
  Administered 2016-04-05: 10 g via ORAL
  Filled 2016-04-04: qty 30

## 2016-04-04 MED ORDER — CLONAZEPAM 0.5 MG PO TABS
0.5000 mg | ORAL_TABLET | Freq: Two times a day (BID) | ORAL | Status: DC
  Filled 2016-04-04: qty 1

## 2016-04-04 MED ORDER — ALBUTEROL SULFATE HFA 108 (90 BASE) MCG/ACT IN AERS
2.0000 | INHALATION_SPRAY | Freq: Four times a day (QID) | RESPIRATORY_TRACT | Status: DC | PRN
  Filled 2016-04-04: qty 6.7

## 2016-04-04 MED ORDER — LAMOTRIGINE 100 MG PO TABS
100.0000 mg | ORAL_TABLET | Freq: Two times a day (BID) | ORAL | Status: DC
  Administered 2016-04-04: 100 mg via ORAL
  Filled 2016-04-04: qty 1

## 2016-04-04 MED ORDER — FUROSEMIDE 40 MG PO TABS
80.0000 mg | ORAL_TABLET | Freq: Every day | ORAL | Status: DC
  Administered 2016-04-05: 80 mg via ORAL
  Filled 2016-04-04: qty 2

## 2016-04-04 MED ORDER — NITROGLYCERIN 0.4 MG SL SUBL
0.4000 mg | SUBLINGUAL_TABLET | Freq: Once | SUBLINGUAL | Status: AC
Start: 2016-04-04 — End: 2016-04-04
  Administered 2016-04-04: 0.4 mg via SUBLINGUAL
  Filled 2016-04-04: qty 1

## 2016-04-04 MED ORDER — TRAMADOL HCL 50 MG PO TABS: 50.0000 mg | ORAL_TABLET | Freq: Four times a day (QID) | ORAL | Status: DC | PRN

## 2016-04-04 MED ORDER — SPIRONOLACTONE 25 MG PO TABS
25.0000 mg | ORAL_TABLET | Freq: Every day | ORAL | Status: DC
  Administered 2016-04-05: 25 mg via ORAL
  Filled 2016-04-04: qty 1

## 2016-04-04 MED ORDER — IOPAMIDOL (ISOVUE-370) INJECTION 76%
75.0000 mL | Freq: Once | INTRAVENOUS | Status: AC | PRN
  Administered 2016-04-04: 75 mL via INTRAVENOUS

## 2016-04-04 MED ORDER — ASPIRIN 81 MG PO CHEW: 324.0000 mg | CHEWABLE_TABLET | Freq: Once | ORAL | Status: DC

## 2016-04-04 MED ORDER — MOMETASONE FURO-FORMOTEROL FUM 200-5 MCG/ACT IN AERO
2.0000 | INHALATION_SPRAY | Freq: Two times a day (BID) | RESPIRATORY_TRACT | Status: DC
  Filled 2016-04-04: qty 8.8

## 2016-04-04 MED ORDER — METOCLOPRAMIDE HCL 10 MG PO TABS
5.0000 mg | ORAL_TABLET | Freq: Three times a day (TID) | ORAL | Status: DC
  Administered 2016-04-04 – 2016-04-05 (×3): 5 mg via ORAL
  Filled 2016-04-04 (×3): qty 1

## 2016-04-04 NOTE — ED Notes (Signed)
EDP notified of BP, Verbal orders received for NS bolus

## 2016-04-04 NOTE — ED Provider Notes (Signed)
[facility] Emergency Department Provider Note   ____________________________________________    I have reviewed the triage vital signs and the nursing notes.   HISTORY  Chief Complaint Chest Pain and Dysuria     HPI Stephanie [name] is a 65 y.o. female Who presents with complaints of moderate achingchest pain and mild shortness of breath.she reports this started yesterday. She does report cough which is nonproductive. She denies smoking. No fevers or chills. No nausea or vomiting. No diaphoresis.she denies a history of heart attacks.   Past Medical History:  Diagnosis Date  . Adenomatous colon polyp 08/27/2014   Polyps x 3  . Anemia   . Anxiety   . 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 Arthritis   . Asthma   . Bipolar affective disorder ([place])    h/o  . Blood transfusion 2000  . Blood transfusion without reported diagnosis   . Chronic abdominal pain    abdominal wall pain  . 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 Colon polyp   . Depression   . Diabetes mellitus without complication ([place])   . Fatty liver 04/09/08   found in abd CT  . GERD (gastroesophageal reflux disease)   . H/O: CVA (cardiovascular accident)    TIA  . H/O: rheumatic fever   . Hyperlipidemia   . Incontinence   . Insomnia   . Obstructive sleep apnea    not using CPAP, last sleep study 2.5 years ago, per pt doctor is aware  . Platelets decreased ([place])   . RLS (restless legs syndrome)   . Shortness of breath   . Sleep apnea   . Stroke [facility]    TIA- 2002  . Ulcer ([place])   . Unspecified disorders of nervous system     Patient Active Problem List   Diagnosis Date Noted  . Iron deficiency anemia 01/30/2016  . Hypoglycemia 09/09/2015  . Hyperglycemia 07/16/2015  . Neuropathy ([place]) 06/08/2015  . Thrombocytopenia ([place]) 08/19/2014  . Abdominal pain, chronic, epigastric 06/12/2014  . Liver cirrhosis secondary to NASH ([place]) 02/27/2014  . Dizziness and giddiness  10/30/2013  . DOE (dyspnea on exertion) 10/30/2013  . Poorly controlled type 2 diabetes mellitus with complication ([place]) 16/06/930  . DNR (do not resuscitate) 07/29/2013  . Encounter for routine gynecological examination 07/29/2013  . Dyspnea 07/10/2013  . Lung nodule 07/10/2013  . Varicose veins of lower extremities with other complications 35/57/3220  . GERD (gastroesophageal reflux disease) 10/09/2012  . Cirrhosis of liver ([place]) 02/02/2012  . Anxiety 07/11/2011  . Aortic dissection ([place])   . BACK PAIN, LUMBAR, CHRONIC 11/19/2009  . UNS ADVRS EFF OTH RX MEDICINAL&BIOLOGICAL SBSTNC 12/16/2008  . UNSPECIFIED VITAMIN D DEFICIENCY 09/26/2008  . TOBACCO ABUSE 06/19/2008  . RESTLESS LEGS SYNDROME 04/27/2007  . INSOMNIA 04/27/2007  . HLD (hyperlipidemia) 03/08/2007  . OSA (obstructive sleep apnea) 03/08/2007  . Essential hypertension 03/08/2007  . Asthma 03/08/2007  . BIPOLAR AFFECTIVE DISORDER, HX OF 03/08/2007    Past Surgical History:  Procedure Laterality Date  . ABDOMINAL EXPLORATION SURGERY    . ABDOMINAL HYSTERECTOMY     total  . Axillary artery cannulation via 8-mm Hemashield graft, median sternotomy, extracorporeal circulation with deep hypothermic circulatory arrest, repair of  aortic dessection  01/23/2009   Dr Arlyce Dice  . BACK SURGERY     x 45  . cataract     both eyes  . CHOLECYSTECTOMY    . COLONOSCOPY    .  EYE SURGERY     bilat  . HEMORROIDECTOMY     and colon polyp removed  . LIVER BIOPSY  04/10/2012   Procedure: LIVER BIOPSY;  Surgeon: Inda Castle, MD;  Location: [facility];  Service: Endoscopy;  Laterality: N/A;  ultrasound to mark order for abd limited/liver to be marked to be sent by [email or web address]   . LUMBAR FUSION  10/09  . LUMBAR WOUND DEBRIDEMENT  05/09/2011   Procedure: LUMBAR WOUND DEBRIDEMENT;  Surgeon: Dahlia Bailiff;  Location: [facility];  Service: Orthopedics;  Laterality: N/A;  IRRIGATION AND DEBRIDEMENT SPINAL WOUND  . POSTERIOR CERVICAL  FUSION/FORAMINOTOMY    . ROTATOR CUFF REPAIR     left    Prior to Admission medications   Medication Sig Start Date End Date Taking? Authorizing Provider  albuterol (PROAIR HFA) 108 (90 Base) MCG/ACT inhaler Inhale 2 puffs into the lungs See admin instructions. Inhale 2 puffs every 4 to 6 hours as needed for cough, wheeze or chest congestion.    Historical Provider, MD  albuterol (VENTOLIN HFA) 108 (90 Base) MCG/ACT inhaler Inhale 2 puffs into the lungs every 6 (six) hours as needed for wheezing or shortness of breath. 12/24/15   Chesley Mires, MD  ARIPiprazole (ABILIFY) 30 MG tablet Take 30 mg by mouth daily.    Historical Provider, MD  budesonide-formoterol (SYMBICORT) 160-4.5 MCG/ACT inhaler Inhale 2 puffs into the lungs 2 (two) times daily.    Historical Provider, MD  buPROPion (WELLBUTRIN SR) 150 MG 12 hr tablet Take 150 mg by mouth daily.    Historical Provider, MD  celecoxib (CELEBREX) 100 MG capsule Take 100 mg by mouth daily.    Historical Provider, MD  cephALEXin (KEFLEX) 500 MG capsule Take 1 capsule (500 mg total) by mouth 3 (three) times daily. 03/09/16   Carrie Mew, MD  clindamycin (CLEOCIN) 300 MG capsule Take 1 capsule (300 mg total) by mouth 3 (three) times daily. 01/31/16   Delman Kitten, MD  clonazePAM (KLONOPIN) 0.5 MG tablet Take 0.5 mg by mouth 2 (two) times daily.    Historical Provider, MD  dicyclomine (BENTYL) 10 MG capsule Take 10 mg by mouth 3 (three) times daily.     Historical Provider, MD  docusate sodium (COLACE) 100 MG capsule Take 100 mg by mouth 2 (two) times daily.    Historical Provider, MD  famotidine (PEPCID) 20 MG tablet Take 20 mg by mouth 2 (two) times daily.    Historical Provider, MD  ferrous sulfate 325 (65 FE) MG tablet Take 325 mg by mouth 2 (two) times daily.     Historical Provider, MD  furosemide (LASIX) 80 MG tablet Take 80 mg by mouth daily.    Historical Provider, MD  gabapentin (NEURONTIN) 600 MG tablet Take 600 mg by mouth 2 (two) times daily.      Historical Provider, MD  insulin lispro (HUMALOG) 100 UNIT/ML injection Inject 0-9 Units into the skin 3 (three) times daily before meals. Pt uses as needed per sliding scale:    150-200:  0 units  201-250:  5 units  251-300:  8 units 301-350:  9 units  Greater than 350:  Call MD    Historical Provider, MD  lactulose (CHRONULAC) 10 GM/15ML solution Take 10 g by mouth daily.    Historical Provider, MD  lamoTRIgine (LAMICTAL) 100 MG tablet Take 100 mg by mouth 2 (two) times daily.    Historical Provider, MD  loperamide (IMODIUM) 2 MG capsule Take 4 mg by mouth as needed  for diarrhea or loose stools. *Do not exceed 8 doses in 24 hours*    Historical Provider, MD  LORazepam (ATIVAN) 0.5 MG tablet Take 0.5 mg by mouth daily as needed for anxiety.     Historical Provider, MD  metFORMIN (GLUCOPHAGE) 500 MG tablet Take 500-1,000 mg by mouth See admin instructions. Take 2 tablets (1000 mg) by mouth every morning with meal, and take 1 tablet by mouth every evening at 5 pm with meal.    Historical Provider, MD  metoCLOPramide (REGLAN) 10 MG tablet Take 1 tablet (10 mg total) by mouth 4 (four) times daily -  before meals and at bedtime. Patient not taking: Reported on 01/31/2016 12/09/15   Carrie Mew, MD  metoCLOPramide (REGLAN) 5 MG tablet Take 5 mg by mouth 4 (four) times daily.    Historical Provider, MD  metoprolol tartrate (LOPRESSOR) 25 MG tablet Take 25 mg by mouth 2 (two) times daily.    Historical Provider, MD  Multiple Vitamin (MULTIVITAMIN WITH MINERALS) TABS tablet Take 1 tablet by mouth daily with breakfast.     Historical Provider, MD  omeprazole (PRILOSEC) 40 MG capsule Take 40 mg by mouth daily before breakfast.    Historical Provider, MD  oxybutynin (DITROPAN-XL) 5 MG 24 hr tablet Take 5 mg by mouth daily.    Historical Provider, MD  oxyCODONE (OXY IR/ROXICODONE) 5 MG immediate release tablet Take 5 mg by mouth every 12 (twelve) hours.     Historical Provider, MD  potassium chloride  (K-DUR,KLOR-CON) 10 MEQ tablet Take 10 mEq by mouth every Monday, Wednesday, and Friday. *Note dose*    Historical Provider, MD  potassium chloride SA (K-DUR,KLOR-CON) 20 MEQ tablet Take 20 mEq by mouth daily. 11/16/15 01/29/16  Historical Provider, MD  potassium chloride SA (K-DUR,KLOR-CON) 20 MEQ tablet Take 20 mEq by mouth daily. *Note dose*    Historical Provider, MD  pregabalin (LYRICA) 75 MG capsule Take 75 mg by mouth 3 (three) times daily.    Historical Provider, MD  rOPINIRole (REQUIP) 3 MG tablet Take 3 mg by mouth at bedtime.    Historical Provider, MD  senna (SENOKOT) 8.6 MG tablet Take 1 tablet by mouth 3 (three) times a week. Take on Monday, Wednesday, and Friday. *Hold for loose stools*    Historical Provider, MD  simvastatin (ZOCOR) 10 MG tablet Take 10 mg by mouth at bedtime.     Historical Provider, MD  sucralfate (CARAFATE) 1 g tablet Take 1 tablet (1 g total) by mouth 4 (four) times daily. 12/09/15   Carrie Mew, MD  traMADol (ULTRAM) 50 MG tablet Take 50 mg by mouth every 6 (six) hours as needed for moderate pain.    Historical Provider, MD  vitamin C (ASCORBIC ACID) 500 MG tablet Take 500 mg by mouth 2 (two) times daily.    Historical Provider, MD  zolpidem (AMBIEN) 5 MG tablet Take 5 mg by mouth at bedtime.     Historical Provider, MD     Allergies Doxycycline  Family History  Problem Relation Age of Onset  . Breast cancer Mother   . Lung cancer Mother   . Stomach cancer Father   . Diabetes      4 aunts, and 1 uncle  . Colon cancer Neg Hx   . Esophageal cancer Neg Hx   . Rectal cancer Neg Hx     Social History Social History  Substance Use Topics  . Smoking status: Former Smoker    Packs/day: 1.50    Years:  50.00    Types: Cigarettes    Quit date: 04/06/2013  . Smokeless tobacco: Never Used  . Alcohol use No    Review of Systems  Constitutional: No fever/chills Eyes: No visual changes.  ENT: No sore throat. Cardiovascular: As above Respiratory: As  above Gastrointestinal: No abdominal pain.   Genitourinary: Negative for dysuria. Musculoskeletal: Negative for back pain. Skin: Negative for rash. Neurological: Negative for headaches or weakness  10-point ROS otherwise negative.  ____________________________________________   PHYSICAL EXAM:  VITAL SIGNS: ED Triage Vitals  Enc Vitals Group     BP 04/04/16 0930 (!) 119/45     Pulse Rate 04/04/16 0930 93     Resp 04/04/16 0930 18     Temp 04/04/16 0930 98.3 F (36.8 C)     Temp Source 04/04/16 0930 Oral     SpO2 04/04/16 0923 99 %     Weight 04/04/16 0930 224 lb (101.6 kg)     Height 04/04/16 0930 5' (1.524 m)     Head Circumference --      Peak Flow --      Pain Score 04/04/16 0930 9     Pain Loc --      Pain Edu? --      Excl. in [place]? --     Constitutional: Alert and oriented. No acute distress. Eyes: Conjunctivae are normal.   Nose: No congestion/rhinnorhea. Mouth/Throat: Mucous membranes are moist.    Cardiovascular: Normal rate, regular rhythm. Grossly normal heart sounds.  Good peripheral circulation. Respiratory: Normal respiratory effort.  No retractions. Lungs CTAB. Gastrointestinal: Soft and nontender. No distention.  No CVA tenderness. Genitourinary: deferred Musculoskeletal: No lower extremity tenderness nor edema.  Warm and well perfused Neurologic:  Normal speech and language. No gross focal neurologic deficits are appreciated.  Skin:  Skin is warm, dry and intact. No rash noted. Psychiatric: Mood and affect are normal. Speech and behavior are normal.  ____________________________________________   LABS (all labs ordered are listed, but only abnormal results are displayed)  Labs Reviewed  BASIC METABOLIC PANEL - Abnormal; Notable for the following:       Result Value   Potassium 3.1 (*)    Chloride 100 (*)    Glucose, Bld 160 (*)    BUN 22 (*)    Creatinine, Ser 1.05 (*)    GFR calc non Af Amer 55 (*)    All other components within normal  limits  CBC - Abnormal; Notable for the following:    RDW 16.4 (*)    Platelets 76 (*)    All other components within normal limits  TROPONIN I   ____________________________________________  EKG  ED ECG REPORT I, Lavonia Drafts, the attending physician, personally viewed and interpreted this ECG.  Date: 04/04/2016 EKG Time: 9:25 AM Rate: 91 Rhythm: normal sinus rhythm QRS Axis: normal Intervals: normal ST/T Wave abnormalities: normal Conduction Disturbances: PVCs   ____________________________________________  RADIOLOGY  Chest x-ray unremarkable CT angiography does not show signs of PE or aortic pathology ____________________________________________   PROCEDURES  Procedure(s) performed: No    Critical Care performed: No ____________________________________________   INITIAL IMPRESSION / ASSESSMENT AND PLAN / ED COURSE  Pertinent labs & imaging results that were available during my care of the patient were reviewed by me and considered in my medical decision making (see chart for details).  Patient presents with chest pain and mild tenderness of breath. Medical chart shows a history of an aortic dissection the past although she does not know  about this. Initial labs are reassuring, suspect CT angiography will be necessary however given her symptoms.  Clinical Course  ----------------------------------------- 2:41 PM on 04/04/2016 -----------------------------------------  Patient continues with mild chest discomfort. I will admit her to the hospitalist for further management. ____________________________________________   FINAL CLINICAL IMPRESSION(S) / ED DIAGNOSES  Final diagnoses:  Chest pain, unspecified type      NEW MEDICATIONS STARTED DURING THIS VISIT:  New Prescriptions   No medications on file     Note:  This document was prepared using Dragon voice recognition software and may include unintentional dictation errors.    Lavonia Drafts, MD 04/04/16 [phone]

## 2016-04-04 NOTE — Progress Notes (Signed)
Patient admitted to unit by [facility]. Oriented to room, call bell, and staff. Bed in lowest position. Fall safety plan reviewed. Full assessment to Epic. Skin assessment verified with Betsy Coder. Telemetry box verification with tele clerk by Casey Burkitt and Betsy Coder. Will continue to monitor. MRSA swab sent to lab as patient is from assisted living facility.

## 2016-04-04 NOTE — Progress Notes (Signed)
Pt doesn't want to wear CPAP for sleep. Pt states that the oxygen is enough for her. Pt encouraged to call should she decide to wear CPAP. Sharyn Lull, RN aware.

## 2016-04-04 NOTE — Progress Notes (Signed)
Anticoagulation monitoring(Lovenox):  65 yo  female ordered Lovenox 40 mg Q24h  Filed Weights   04/04/16 0930  Weight: 224 lb (101.6 kg)   BMI 43   Lab Results  Component Value Date   CREATININE 1.05 (H) 04/04/2016   CREATININE 1.07 (H) 03/09/2016   CREATININE 0.82 01/31/2016   Estimated Creatinine Clearance: 57.3 mL/min (by C-G formula based on SCr of 1.05 mg/dL (H)). Hemoglobin & Hematocrit     Component Value Date/Time   HGB 12.5 04/04/2016 0929   HGB 11.7 (L) 09/29/2014 0920   HCT 36.8 04/04/2016 0929   HCT 34.7 (L) 09/29/2014 0920     Per Protocol for Patient with estCrcl > 30 ml/min and BMI > 40, will transition to Lovenox 40 mg Q12h.

## 2016-04-04 NOTE — H&P (Signed)
[facility] NAME: Stephanie [name]    MR#:  [id number]  DATE OF BIRTH:  Oct 14, 1950  DATE OF ADMISSION:  04/04/2016  PRIMARY CARE PHYSICIAN: Sarajane Jews, MD   REQUESTING/REFERRING PHYSICIAN: Dr Lavonia Drafts  CHIEF COMPLAINT:   Chief Complaint  Patient presents with  . Chest Pain  . Dysuria    HISTORY OF PRESENT ILLNESS:  Stephanie [name]  is a 65 y.o. female presents with chest pain started this morning. Chest pain described as sharp pain knifelike at the top of her abdomen lower chest. It started while she was lying in bed and it's been constant since 9 AM. It hurts when she takes a deep breath or turns and moves. She's also had a dull headache that's been constant since after the chest pain started. In the ER her first troponin was negative and CT scan of the chest with contrast was negative for pulmonary embolism or aortic dissection. Hospitalist services were contacted for further evaluation.  PAST MEDICAL HISTORY:   Past Medical History:  Diagnosis Date  . Adenomatous colon polyp 08/27/2014   Polyps x 3  . Anemia   . Anxiety   . 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 Arthritis   . Asthma   . Bipolar affective disorder ([name])    h/o  . Blood transfusion 2000  . Blood transfusion without reported diagnosis   . Chronic abdominal pain    abdominal wall pain  . 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 Colon polyp   . Depression   . Diabetes mellitus without complication ([place])   . Fatty liver 04/09/08   found in abd CT  . GERD (gastroesophageal reflux disease)   . H/O: CVA (cardiovascular accident)    TIA  . H/O: rheumatic fever   . Hyperlipidemia   . Incontinence   . Insomnia   . Obstructive sleep apnea    not using CPAP, last sleep study 2.5 years ago, per pt doctor is aware  . Platelets decreased ([place])   . RLS (restless legs syndrome)   . Shortness of breath   . Sleep apnea   . Stroke [facility])    TIA- 2002   . Ulcer ([place])   . Unspecified disorders of nervous system     PAST SURGICAL HISTORY:   Past Surgical History:  Procedure Laterality Date  . ABDOMINAL EXPLORATION SURGERY    . ABDOMINAL HYSTERECTOMY     total  . Axillary artery cannulation via 8-mm Hemashield graft, median sternotomy, extracorporeal circulation with deep hypothermic circulatory arrest, repair of  aortic dessection  01/23/2009   Dr Arlyce Dice  . BACK SURGERY     x 45  . cataract     both eyes  . CHOLECYSTECTOMY    . COLONOSCOPY    . EYE SURGERY     bilat  . HEMORROIDECTOMY     and colon polyp removed  . LIVER BIOPSY  04/10/2012   Procedure: LIVER BIOPSY;  Surgeon: Inda Castle, MD;  Location: [facility];  Service: Endoscopy;  Laterality: N/A;  ultrasound to mark order for abd limited/liver to be marked to be sent by [email or web address]   . LUMBAR FUSION  10/09  . LUMBAR WOUND DEBRIDEMENT  05/09/2011   Procedure: LUMBAR WOUND DEBRIDEMENT;  Surgeon: Dahlia Bailiff;  Location: [facility];  Service: Orthopedics;  Laterality: N/A;  IRRIGATION AND DEBRIDEMENT SPINAL WOUND  . POSTERIOR CERVICAL FUSION/FORAMINOTOMY    .  ROTATOR CUFF REPAIR     left    SOCIAL HISTORY:   Social History  Substance Use Topics  . Smoking status: Former Smoker    Packs/day: 1.50    Years: 50.00    Types: Cigarettes    Quit date: 04/06/2013  . Smokeless tobacco: Never Used  . Alcohol use No    FAMILY HISTORY:   Family History  Problem Relation Age of Onset  . Breast cancer Mother   . Lung cancer Mother   . Stomach cancer Father   . Diabetes      4 aunts, and 1 uncle  . Colon cancer Neg Hx   . Esophageal cancer Neg Hx   . Rectal cancer Neg Hx     DRUG ALLERGIES:   Allergies  Allergen Reactions  . Doxycycline Hives, Swelling and Other (See Comments)    Reaction:  Facial swelling     REVIEW OF SYSTEMS:  CONSTITUTIONAL: Positive for fever and chills. Positive for weight loss. Positive for fatigue.  EYES: No blurred or double  vision.  EARS, NOSE, AND THROAT: No tinnitus or ear pain. Positive for sore throat. RESPIRATORY: Some cough. Positive for shortness of breath. No wheezing or hemoptysis.  CARDIOVASCULAR: Positive for chest pain. No orthopnea, edema.  GASTROINTESTINAL: No nausea, vomiting, diarrhea. Positive for abdominal pain. No blood in bowel movements GENITOURINARY: Positive for dysuria. No hematuria.  ENDOCRINE: No polyuria, nocturia,  HEMATOLOGY: No anemia, easy bruising or bleeding SKIN: No rash or lesion. MUSCULOSKELETAL: No joint pain or arthritis.   NEUROLOGIC: No tingling, numbness, weakness.  PSYCHIATRY: Positive for anxiety and depression.   MEDICATIONS AT HOME:   Prior to Admission medications   Medication Sig Start Date End Date Taking? Authorizing Provider  acetaminophen (TYLENOL) 325 MG tablet Take 650 mg by mouth every 6 (six) hours as needed.   Yes Historical Provider, MD  albuterol (VENTOLIN HFA) 108 (90 Base) MCG/ACT inhaler Inhale 2 puffs into the lungs every 6 (six) hours as needed for wheezing or shortness of breath. 12/24/15  Yes Chesley Mires, MD  ARIPiprazole (ABILIFY) 30 MG tablet Take 30 mg by mouth daily.   Yes Historical Provider, MD  budesonide-formoterol (SYMBICORT) 160-4.5 MCG/ACT inhaler Inhale 2 puffs into the lungs 2 (two) times daily.   Yes Historical Provider, MD  buPROPion (WELLBUTRIN SR) 150 MG 12 hr tablet Take 150 mg by mouth daily.   Yes Historical Provider, MD  clonazePAM (KLONOPIN) 0.5 MG tablet Take 0.5 mg by mouth 2 (two) times daily.   Yes Historical Provider, MD  ferrous sulfate 325 (65 FE) MG tablet Take 325 mg by mouth 2 (two) times daily.    Yes Historical Provider, MD  furosemide (LASIX) 80 MG tablet Take 80 mg by mouth daily.   Yes Historical Provider, MD  gabapentin (NEURONTIN) 600 MG tablet Take 600 mg by mouth 2 (two) times daily.    Yes Historical Provider, MD  insulin glargine (LANTUS) 100 UNIT/ML injection Inject 20 Units into the skin at bedtime.    Yes Historical Provider, MD  insulin lispro (HUMALOG) 100 UNIT/ML injection Inject 0-9 Units into the skin 3 (three) times daily before meals. Pt uses as needed per sliding scale:    150-200:  0 units  201-250:  5 units  251-300:  8 units 301-350:  9 units  Greater than 350:  Call MD   Yes Historical Provider, MD  lactulose (CHRONULAC) 10 GM/15ML solution Take 10 g by mouth daily.   Yes Historical Provider, MD  lamoTRIgine (LAMICTAL) 100 MG tablet Take 100 mg by mouth 2 (two) times daily.   Yes Historical Provider, MD  LORazepam (ATIVAN) 0.5 MG tablet Take 0.5 mg by mouth daily as needed for anxiety.    Yes Historical Provider, MD  metFORMIN (GLUCOPHAGE) 500 MG tablet Take 500 mg by mouth daily. Take 2 tablets (1000 mg) by mouth every morning with meal, and take 1 tablet by mouth every evening at 5 pm with meal.   Yes Historical Provider, MD  metoCLOPramide (REGLAN) 5 MG tablet Take 5 mg by mouth 4 (four) times daily. Before meals and at bedtime   Yes Historical Provider, MD  mirtazapine (REMERON) 15 MG tablet Take 15 mg by mouth at bedtime.   Yes Historical Provider, MD  Multiple Vitamin (MULTIVITAMIN WITH MINERALS) TABS tablet Take 1 tablet by mouth daily with breakfast.    Yes Historical Provider, MD  omeprazole (PRILOSEC) 40 MG capsule Take 40 mg by mouth daily before breakfast.   Yes Historical Provider, MD  oxybutynin (DITROPAN-XL) 5 MG 24 hr tablet Take 5 mg by mouth daily.   Yes Historical Provider, MD  oxyCODONE (OXY IR/ROXICODONE) 5 MG immediate release tablet Take 5 mg by mouth every 12 (twelve) hours.    Yes Historical Provider, MD  pioglitazone (ACTOS) 30 MG tablet Take 30 mg by mouth daily.   Yes Historical Provider, MD  potassium chloride SA (K-DUR,KLOR-CON) 20 MEQ tablet Take 20 mEq by mouth daily. *Note dose*   Yes Historical Provider, MD  pregabalin (LYRICA) 150 MG capsule Take 150 mg by mouth 3 (three) times daily.    Yes Historical Provider, MD  rOPINIRole (REQUIP) 3 MG  tablet Take 3 mg by mouth at bedtime.   Yes Historical Provider, MD  senna (SENOKOT) 8.6 MG tablet Take 1 tablet by mouth 3 (three) times a week. Take on Monday, Wednesday, and Friday. *Hold for loose stools*   Yes Historical Provider, MD  simvastatin (ZOCOR) 10 MG tablet Take 10 mg by mouth at bedtime.    Yes Historical Provider, MD  spironolactone (ALDACTONE) 25 MG tablet Take 25 mg by mouth daily.   Yes Historical Provider, MD  vitamin C (ASCORBIC ACID) 500 MG tablet Take 500 mg by mouth 2 (two) times daily.   Yes Historical Provider, MD  zolpidem (AMBIEN) 5 MG tablet Take 5 mg by mouth at bedtime.    Yes Historical Provider, MD  dicyclomine (BENTYL) 10 MG capsule Take 10 mg by mouth 3 (three) times daily.     Historical Provider, MD  loperamide (IMODIUM) 2 MG capsule Take 4 mg by mouth as needed for diarrhea or loose stools. *Do not exceed 8 doses in 24 hours*    Historical Provider, MD  traMADol (ULTRAM) 50 MG tablet Take 50 mg by mouth every 6 (six) hours as needed for moderate pain.    Historical Provider, MD      VITAL SIGNS:  Blood pressure 108/71, pulse 93, temperature 98.3 F (36.8 C), temperature source Oral, resp. rate 14, height 5' (1.524 m), weight 101.6 kg (224 lb), SpO2 98 %.  PHYSICAL EXAMINATION:  GENERAL:  65 y.o.-year-old patient lying in the bed with no acute distress.  EYES: Pupils equal, round, reactive to light and accommodation. No scleral icterus. Extraocular muscles intact.  HEENT: Head atraumatic, normocephalic. Oropharynx and nasopharynx clear.  NECK:  Supple, no jugular venous distention. No thyroid enlargement, no tenderness.  LUNGS: Normal breath sounds bilaterally, no wheezing, rales,rhonchi or crepitation. No use of accessory muscles  of respiration.  CARDIOVASCULAR: S1, S2 normal. No murmurs, rubs, or gallops. Positive pain to palpation over chest wall bilaterally and xiphoid process ABDOMEN: Soft, positive for epigastric pain and pain throughout abdomen,  nondistended. Bowel sounds present. No organomegaly or mass.  EXTREMITIES: No pedal edema, cyanosis, or clubbing.  NEUROLOGIC: Cranial nerves II through XII are intact. Muscle strength 5/5 in all extremities. Sensation intact. Gait not checked.  PSYCHIATRIC: The patient is alert and oriented x 3.  SKIN: No rash, lesion, or ulcer.   LABORATORY PANEL:   CBC  Recent Labs Lab 04/04/16 0929  WBC 7.6  HGB 12.5  HCT 36.8  PLT 76*   ------------------------------------------------------------------------------------------------------------------  Chemistries   Recent Labs Lab 04/04/16 0929  NA 139  K 3.1*  CL 100*  CO2 30  GLUCOSE 160*  BUN 22*  CREATININE 1.05*  CALCIUM 9.2   ------------------------------------------------------------------------------------------------------------------  Cardiac Enzymes  Recent Labs Lab 04/04/16 0929  TROPONINI <0.03   ------------------------------------------------------------------------------------------------------------------  RADIOLOGY:  Dg Chest 2 View  Result Date: 04/04/2016 CLINICAL DATA:  Cough. EXAM: CHEST  2 VIEW COMPARISON:  03/09/2016. FINDINGS: Cardiomegaly with mild bilateral pulmonary interstitial prominence. Mild chest and heart failure and/or pneumonitis cannot be excluded. Prior cervical thoracic fusion. Neurostimulator noted . IMPRESSION: Cardiomegaly with bilateral mild pulmonary interstitial prominence. Mild congestive heart failure and/or pneumonitis cannot be excluded. Low lung volumes. Electronically Signed   By: Marcello Moores  Register   On: 04/04/2016 09:58   Ct Angio Chest Pe W And/or Wo Contrast  Result Date: 04/04/2016 CLINICAL DATA:  Cough and chest pain and pressure beginning last night. History of aortic dissection previously. EXAM: CT ANGIOGRAPHY CHEST WITH CONTRAST TECHNIQUE: Multidetector CT imaging of the chest was performed using the standard protocol during bolus administration of intravenous  contrast. Multiplanar CT image reconstructions and MIPs were obtained to evaluate the vascular anatomy. CONTRAST:  75 cc Isovue 370 COMPARISON:  CT same day.  CT angiography 12/09/2015. FINDINGS: Cardiovascular: Chronic cardiomegaly. Small pericardial effusion. Extensive coronary artery calcification. Thoracic aortic atherosclerosis but no evidence of aneurysm or dissection. Pulmonary arterial opacification is good and there are no pulmonary emboli. Mediastinum/Nodes: No enlarged hilar or mediastinal lymph nodes. No mass. Lungs/Pleura: The lungs are clear.  No pleural fluid. Upper Abdomen: Chronic cirrhosis of the liver with portal venous hypertension and splenomegaly. Musculoskeletal: Chronic spinal neurostimulator. Ordinary thoracic spondylosis. Review of the MIP images confirms the above findings. IMPRESSION: No acute vascular pathology identifiable by CT. No evidence of aortic dissection or pulmonary emboli. Aortic and coronary artery atherosclerosis. Cardiomegaly. Small pericardial effusion. Cirrhosis and portal venous hypertension. Electronically Signed   By: Nelson Chimes M.D.   On: 04/04/2016 12:02    EKG:   Normal sinus rhythm, PVCs no acute ST-T wave changes  IMPRESSION AND PLAN:   1. Chest pain unspecified. Pain is sharp in nature and reproducible and hurts when she bends and twists and take a deep breath. CT scan of the chest was negative for pulmonary embolus a more aortic dissection. I will give Toradol and Decadron for inflammatory chest wall pain and see if this helps. Serial cardiac enzymes. If enzymes negative will get stress test tomorrow. 2. Headache. We'll give IV magnesium, Toradol and Decadron 3. Epigastric abdominal pain. Change PPI to twice a day 4. Sleep apnea on BiPAP 5. Type 2 diabetes hold diabetic medications since the patient will be nothing by mouth tomorrow for stress test. Hold Glucophage 48 hours after CT scan with contrast 6. Restless leg syndrome on Requip at  night 7. Cirrhosis of liver with thrombocytopenia. Follow-up as outpatient 8. Acute cystitis without hematuria on Rocephin follow-up urine culture   All the records are reviewed and case discussed with ED provider. Management plans discussed with the patient, family and they are in agreement.  CODE STATUS: Full code  TOTAL TIME TAKING CARE OF THIS PATIENT: 50 minutes.    Loletha Grayer M.D on 04/04/2016 at 3:37 PM  Between 7am to 6pm - Pager - [phone]  After 6pm call admission pager [phone]  [facility]  [phone]  CC: Primary care physician; Sarajane Jews, MD

## 2016-04-04 NOTE — Care Management Obs Status (Signed)
[facility] NOTIFICATION   Patient Details  Name: Stephanie [name] MRN: [id number] Date of Birth: [birth year]   Medicare Observation Status Notification Given:  Yes    Beau Fanny, RN 04/04/2016, 3:31 PM

## 2016-04-04 NOTE — ED Triage Notes (Addendum)
Pt reports last night she began having cough, this am pt began having tight/stabbing pain to center of her chest. Pain worsens with breathing and movement.  Pt also reports headache. Dysuria began 1 week ago.  324ASA was given by EMS

## 2016-04-04 NOTE — ED Notes (Signed)
Pt placed on 2L [place] due to oxygen 88-90% on RA. Pt states that it causes her pain to take a deep breath and is taking shallow breaths to avoid pain. Reports using CPAP at night intermittently.

## 2016-04-04 NOTE — Progress Notes (Signed)
Chaplain visited patient while rounding. Pt. Stephanie [name] that she was admitted today. She was very tired because she arrived this morning and she did not have enough time to sleep. Chaplain prayed with patient and left the room.

## 2016-04-05 ENCOUNTER — Observation Stay: Payer: Medicare Other

## 2016-04-05 ENCOUNTER — Encounter (INDEPENDENT_AMBULATORY_CARE_PROVIDER_SITE_OTHER): Payer: Medicare Other | Admitting: Vascular Surgery

## 2016-04-05 DIAGNOSIS — G2581 Restless legs syndrome: Secondary | ICD-10-CM | POA: Diagnosis not present

## 2016-04-05 DIAGNOSIS — E119 Type 2 diabetes mellitus without complications: Secondary | ICD-10-CM | POA: Diagnosis not present

## 2016-04-05 DIAGNOSIS — I200 Unstable angina: Secondary | ICD-10-CM | POA: Diagnosis not present

## 2016-04-05 DIAGNOSIS — R0789 Other chest pain: Secondary | ICD-10-CM | POA: Diagnosis not present

## 2016-04-05 DIAGNOSIS — K7460 Unspecified cirrhosis of liver: Secondary | ICD-10-CM | POA: Diagnosis not present

## 2016-04-05 DIAGNOSIS — R079 Chest pain, unspecified: Secondary | ICD-10-CM | POA: Diagnosis not present

## 2016-04-05 LAB — LIPID PANEL
CHOL/HDL RATIO: 1.8 ratio
Cholesterol: 194 mg/dL (ref 0–200)
HDL: 110 mg/dL (ref >40)
LDL Cholesterol: 75 mg/dL (ref 0–99)
Triglycerides: 47 mg/dL (ref <150)
VLDL: 9 mg/dL (ref 0–40)

## 2016-04-05 LAB — CBC
HEMATOCRIT: 37.4 % (ref 35.0–47.0)
HEMOGLOBIN: 12.7 g/dL (ref 12.0–16.0)
MCH: 31.5 pg (ref 26.0–34.0)
MCHC: 33.9 g/dL (ref 32.0–36.0)
MCV: 92.9 fL (ref 80.0–100.0)
Platelets: 75 10*3/uL — ABNORMAL LOW (ref 150–440)
RBC: 4.02 MIL/uL (ref 3.80–5.20)
RDW: 16.6 % — ABNORMAL HIGH (ref 11.5–14.5)
WBC: 6.2 10*3/uL (ref 3.6–11.0)

## 2016-04-05 LAB — BASIC METABOLIC PANEL
ANION GAP: 7 (ref 5–15)
BUN: 22 mg/dL — ABNORMAL HIGH (ref 6–20)
CHLORIDE: 103 mmol/L (ref 101–111)
CO2: 29 mmol/L (ref 22–32)
CREATININE: 0.96 mg/dL (ref 0.44–1.00)
Calcium: 8.9 mg/dL (ref 8.9–10.3)
GFR calc non Af Amer: >60 mL/min (ref >60)
Glucose, Bld: 232 mg/dL — ABNORMAL HIGH (ref 65–99)
POTASSIUM: 3.4 mmol/L — AB (ref 3.5–5.1)
Sodium: 139 mmol/L (ref 135–145)

## 2016-04-05 LAB — MAGNESIUM: MAGNESIUM: 2.4 mg/dL (ref 1.7–2.4)

## 2016-04-05 LAB — GLUCOSE, CAPILLARY
GLUCOSE-CAPILLARY: 174 mg/dL — AB (ref 65–99)
Glucose-Capillary: 124 mg/dL — ABNORMAL HIGH (ref 65–99)
Glucose-Capillary: 242 mg/dL — ABNORMAL HIGH (ref 65–99)

## 2016-04-05 MED ORDER — ALUM & MAG HYDROXIDE-SIMETH 200-200-20 MG/5ML PO SUSP
30.0000 mL | Freq: Four times a day (QID) | ORAL | Status: DC | PRN
Start: 2016-04-05 — End: 2016-04-05

## 2016-04-05 MED ORDER — TECHNETIUM TC 99M TETROFOSMIN IV KIT
13.1690 | PACK | Freq: Once | INTRAVENOUS | Status: AC | PRN
  Administered 2016-04-05: 13.169 via INTRAVENOUS

## 2016-04-05 MED ORDER — TECHNETIUM TC 99M TETROFOSMIN IV KIT
33.0000 | PACK | Freq: Once | INTRAVENOUS | Status: AC | PRN
  Administered 2016-04-05: 30.07 via INTRAVENOUS

## 2016-04-05 MED ORDER — REGADENOSON 0.4 MG/5ML IV SOLN
0.4000 mg | Freq: Once | INTRAVENOUS | Status: AC
  Administered 2016-04-05: 0.4 mg via INTRAVENOUS
  Filled 2016-04-05: qty 5

## 2016-04-05 MED ORDER — CEFUROXIME AXETIL 500 MG PO TABS: 500.0000 mg | ORAL_TABLET | Freq: Two times a day (BID) | ORAL | 0 refills | Status: DC

## 2016-04-05 NOTE — Clinical Social Work Note (Signed)
Clinical Social Work Assessment  Patient Details  Name: Stephanie [name] MRN: [id number] Date of Birth: Jan 23, 1951  Date of referral:  04/05/16               Reason for consult:  Facility Placement                Permission sought to share information with:  Facility Sport and exercise psychologist, Family Supports Permission granted to share information::  Yes, Verbal Permission Granted  Name::     Stephanie [name] [phone]   Agency::  [facility]  Relationship::     Contact Information:     Housing/Transportation Living arrangements for the past 2 months:  [place] of Information:  Patient Patient Interpreter Needed:  None Criminal Activity/Legal Involvement Pertinent to Current Situation/Hospitalization:  No - Comment as needed Significant Relationships:  Other(Comment) Lives with:  Facility Resident Do you feel safe going back to the place where you live?  Yes Need for family participation in patient care:  No (Coment)  Care giving concerns:  Patient states she has been at [facility] for about 6 months and she has been pleased with the care and plans to return.   Social Worker assessment / plan:  Patient is a 65 year old female who is a resident of [facility] she is alert and oriented x4 and able to make her own decisions.  Patient plans to return back to ALF today and facility said they can transport.  Patient was teary-eyed and saying she was not happy being in the hospital, and wants to return back home to her ALF.  Patient did not express any other concerns about returning back to ALF.  Employment status:  Retired Nurse, adult PT Recommendations:  Not assessed at this time Information / Referral to community resources:     Patient/Family's Response to care:  Patient is in agreement to returning back ALF.  Patient/Family's Understanding of and Emotional Response to Diagnosis, Current  Treatment, and Prognosis:  Patient was crying because she does not like being in the hospital, and is ready to go back home.  Emotional Assessment Appearance:  Appears stated age Attitude/Demeanor/Rapport:    Affect (typically observed):  Calm, Appropriate, Stable, Tearful/Crying Orientation:  Oriented to Self, Oriented to Place, Oriented to  Time, Oriented to Situation Alcohol / Substance use:  Not Applicable Psych involvement (Current and /or in the community):  No (Comment)  Discharge Needs  Concerns to be addressed:  No discharge needs identified Readmission within the last 30 days:  No Current discharge risk:  None Barriers to Discharge:  No Barriers Identified   Ross Ludwig 04/05/2016, 2:27 PM

## 2016-04-05 NOTE — Progress Notes (Signed)
Inpatient Diabetes Program Recommendations  AACE/ADA: New Consensus Statement on Inpatient Glycemic Control (2015)  Target Ranges:  Prepandial:   less than 140 mg/dL      Peak postprandial:   less than 180 mg/dL (1-2 hours)      Critically ill patients:  140 - 180 mg/dL   Lab Results  Component Value Date   GLUCAP 174 (H) 04/05/2016   HGBA1C 7.0 09/15/2015    Review of Glycemic Control  Results for Stephanie [name], Stephanie [name] (MRN [id number]) as of 04/05/2016 10:52  Ref. Range 04/04/2016 16:58 04/04/2016 20:57 04/05/2016 07:43  Glucose-Capillary Latest Ref Range: 65 - 99 mg/dL 99 243 (H) 174 (H)   Diabetes history: Type 2 Outpatient Diabetes medications: Humalog 0-9 units tid, Glucophage 1043m qam, 5012mqpm  Current orders for Inpatient glycemic control: Novolog 0-9 units tid, Novolog 0-5 units qhs  Inpatient Diabetes Program Recommendations:   Agree with current medications for blood sugar management.    JuGentry FitzRN, BA, MHA, CDE Diabetes Coordinator Inpatient Diabetes Program  [phone] Pager) [phone]10/31/2017 10:53 AM

## 2016-04-05 NOTE — Progress Notes (Signed)
Per Dr. Saunders Revel, stress test was done by [facility] cardiology and should be read by them. Dr. Nehemiah Massed paged, said he would read the test soon and let me know results (aware that patient is to be discharged if negative).

## 2016-04-05 NOTE — Discharge Summary (Addendum)
[facility] NAME: Stephanie [name]    MR#:  [id number]  DATE OF BIRTH:  1950/09/10  DATE OF ADMISSION:  04/04/2016 ADMITTING PHYSICIAN: Loletha Grayer, MD  DATE OF DISCHARGE:04/05/2016  PRIMARY CARE PHYSICIAN: Sarajane Jews, MD    ADMISSION DIAGNOSIS:  Acute cystitis without hematuria [N30.00] Chest pain [R07.9] Chest pain, unspecified type [R07.9]  DISCHARGE DIAGNOSIS:  Active Problems:   Chest pain   SECONDARY DIAGNOSIS:   Past Medical History:  Diagnosis Date  . Adenomatous colon polyp 08/27/2014   Polyps x 3  . Anemia   . Anxiety   . 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 Arthritis   . Asthma   . Bipolar affective disorder ([place])    h/o  . Blood transfusion 2000  . Blood transfusion without reported diagnosis   . Chronic abdominal pain    abdominal wall pain  . 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 Colon polyp   . Depression   . Diabetes mellitus without complication ([place])   . Fatty liver 04/09/08   found in abd CT  . GERD (gastroesophageal reflux disease)   . H/O: CVA (cardiovascular accident)    TIA  . H/O: rheumatic fever   . Hyperlipidemia   . Incontinence   . Insomnia   . Obstructive sleep apnea    not using CPAP, last sleep study 2.5 years ago, per pt doctor is aware  . Platelets decreased ([place])   . RLS (restless legs syndrome)   . Shortness of breath   . Sleep apnea   . Stroke [facility])    TIA- 2002  . Ulcer ([place])   . Unspecified disorders of nervous system     HOSPITAL COURSE:   65 year old female with obesity, chronic abdominal wall pain and Stephanie [name] presents with atypical chest pain.  1. Atypical chest pain: Patient's symptoms are most consistent with musculoskeletal in nature. She underwent cardiac stress test. She was ruled out for ACS with troponins.  2. Stephanie [name] with chronic thrombocytopenia/chronic abdominal pain   3. Diabetes: Patient will continue outpatient medications. Metformin should  be restarted tomorrow which would be 48 hours after her CT scan with contrast.  4. Restless leg syndrome: Continue Requip  5. Acute cystitis without hematuria: Patient will be discharged on oral Ceftin  6. Objective sleep apnea:Marland Kitchen I encouraged patient to use CPAP every night    DISCHARGE CONDITIONS AND DIET:   Stable for discharge on and other diet   CONSULTS OBTAINED:    DRUG ALLERGIES:   Allergies  Allergen Reactions  . Doxycycline Hives, Swelling and Other (See Comments)    Reaction:  Facial swelling     DISCHARGE MEDICATIONS:   Current Discharge Medication List    START taking these medications   Details  cefUROXime (CEFTIN) 500 MG tablet Take 1 tablet (500 mg total) by mouth 2 (two) times daily with a meal. Qty: 12 tablet, Refills: 0      CONTINUE these medications which have NOT CHANGED   Details  acetaminophen (TYLENOL) 325 MG tablet Take 650 mg by mouth every 6 (six) hours as needed.    albuterol (VENTOLIN HFA) 108 (90 Base) MCG/ACT inhaler Inhale 2 puffs into the lungs every 6 (six) hours as needed for wheezing or shortness of breath. Qty: 1 Inhaler, Refills: 11    ARIPiprazole (ABILIFY) 30 MG tablet Take 30 mg by mouth daily.    budesonide-formoterol (SYMBICORT) 160-4.5 MCG/ACT inhaler  Inhale 2 puffs into the lungs 2 (two) times daily.    buPROPion (WELLBUTRIN SR) 150 MG 12 hr tablet Take 150 mg by mouth daily.    clonazePAM (KLONOPIN) 0.5 MG tablet Take 0.5 mg by mouth 2 (two) times daily.    ferrous sulfate 325 (65 FE) MG tablet Take 325 mg by mouth 2 (two) times daily.     furosemide (LASIX) 80 MG tablet Take 80 mg by mouth daily.    gabapentin (NEURONTIN) 600 MG tablet Take 600 mg by mouth 2 (two) times daily.     insulin glargine (LANTUS) 100 UNIT/ML injection Inject 20 Units into the skin at bedtime.    insulin lispro (HUMALOG) 100 UNIT/ML injection Inject 0-9 Units into the skin 3 (three) times daily before meals. Pt uses as needed per sliding  scale:    150-200:  0 units  201-250:  5 units  251-300:  8 units 301-350:  9 units  Greater than 350:  Call MD    lactulose (CHRONULAC) 10 GM/15ML solution Take 10 g by mouth daily.    lamoTRIgine (LAMICTAL) 100 MG tablet Take 100 mg by mouth 2 (two) times daily.    LORazepam (ATIVAN) 0.5 MG tablet Take 0.5 mg by mouth daily as needed for anxiety.     metFORMIN (GLUCOPHAGE) 500 MG tablet Take 500 mg by mouth daily. Take 2 tablets (1000 mg) by mouth every morning with meal, and take 1 tablet by mouth every evening at 5 pm with meal.    metoCLOPramide (REGLAN) 5 MG tablet Take 5 mg by mouth 4 (four) times daily. Before meals and at bedtime    mirtazapine (REMERON) 15 MG tablet Take 15 mg by mouth at bedtime.    Multiple Vitamin (MULTIVITAMIN WITH MINERALS) TABS tablet Take 1 tablet by mouth daily with breakfast.     omeprazole (PRILOSEC) 40 MG capsule Take 40 mg by mouth daily before breakfast.    oxybutynin (DITROPAN-XL) 5 MG 24 hr tablet Take 5 mg by mouth daily.    oxyCODONE (OXY IR/ROXICODONE) 5 MG immediate release tablet Take 5 mg by mouth every 12 (twelve) hours as needed for severe pain.     pioglitazone (ACTOS) 30 MG tablet Take 30 mg by mouth daily.    potassium chloride SA (K-DUR,KLOR-CON) 20 MEQ tablet Take 20 mEq by mouth daily. *Note dose*    pregabalin (LYRICA) 150 MG capsule Take 150 mg by mouth 3 (three) times daily.     rOPINIRole (REQUIP) 3 MG tablet Take 3 mg by mouth at bedtime.    senna (SENOKOT) 8.6 MG tablet Take 1 tablet by mouth 3 (three) times a week. Take on Monday, Wednesday, and Friday. *Hold for loose stools*    simvastatin (ZOCOR) 10 MG tablet Take 10 mg by mouth at bedtime.     spironolactone (ALDACTONE) 25 MG tablet Take 25 mg by mouth daily.    vitamin C (ASCORBIC ACID) 500 MG tablet Take 500 mg by mouth 2 (two) times daily.    zolpidem (AMBIEN) 5 MG tablet Take 5 mg by mouth at bedtime.     dicyclomine (BENTYL) 10 MG capsule Take 10 mg  by mouth 3 (three) times daily.     loperamide (IMODIUM) 2 MG capsule Take 4 mg by mouth as needed for diarrhea or loose stools. *Do not exceed 8 doses in 24 hours*    traMADol (ULTRAM) 50 MG tablet Take 50 mg by mouth every 6 (six) hours as needed for moderate pain.  Today   CHIEF COMPLAINT:   No acute events overnight. Patient is complaining of pain with movement   VITAL SIGNS:  Blood pressure (!) 106/52, pulse 74, temperature 97.8 F (36.6 C), temperature source Oral, resp. rate (!) 21, height 5' (1.524 m), weight 101.6 kg (224 lb), SpO2 97 %.   REVIEW OF SYSTEMS:  Review of Systems  Constitutional: Negative.  Negative for chills, fever and malaise/fatigue.  HENT: Negative.  Negative for ear discharge, ear pain, hearing loss, nosebleeds and sore throat.   Eyes: Negative.  Negative for blurred vision and pain.  Respiratory: Negative.  Negative for cough, hemoptysis, shortness of breath and wheezing.   Cardiovascular: Negative.  Negative for chest pain, palpitations and leg swelling.  Gastrointestinal: Negative.  Negative for abdominal pain, blood in stool, diarrhea, nausea and vomiting.  Genitourinary: Negative.  Negative for dysuria.  Musculoskeletal: Positive for joint pain. Negative for back pain.  Skin: Negative.   Neurological: Negative for dizziness, tremors, speech change, focal weakness, seizures and headaches.  Endo/Heme/Allergies: Negative.  Does not bruise/bleed easily.  Psychiatric/Behavioral: Negative.  Negative for depression, hallucinations and suicidal ideas.     PHYSICAL EXAMINATION:  GENERAL:  65 y.o.-year-old patient lying in the bed with no acute distress. Obese  NECK:  Supple, no jugular venous distention. No thyroid enlargement, no tenderness.  LUNGS: Normal breath sounds bilaterally, no wheezing, rales,rhonchi  No use of accessory muscles of respiration.  CARDIOVASCULAR: S1, S2 normal. No murmurs, rubs, or gallops.  Tenderness  noted in the chest wall  ABDOMEN: Soft, non-tender, non-distended. Bowel sounds present. No organomegaly or mass.  EXTREMITIES: No pedal edema, cyanosis, or clubbing.  PSYCHIATRIC: The patient is alert and oriented x 3.  SKIN: No obvious rash, lesion, or ulcer.   DATA REVIEW:   CBC  Recent Labs Lab 04/05/16 0325  WBC 6.2  HGB 12.7  HCT 37.4  PLT 75*    Chemistries   Recent Labs Lab 04/05/16 0325  NA 139  K 3.4*  CL 103  CO2 29  GLUCOSE 232*  BUN 22*  CREATININE 0.96  CALCIUM 8.9  MG 2.4    Cardiac Enzymes  Recent Labs Lab 04/04/16 0929 04/04/16 1633 04/04/16 1907  TROPONINI <0.03 <0.03 <0.03    Microbiology Results  @[email or web address] @  RADIOLOGY:  Dg Chest 2 View  Result Date: 04/04/2016 CLINICAL DATA:  Cough. EXAM: CHEST  2 VIEW COMPARISON:  03/09/2016. FINDINGS: Cardiomegaly with mild bilateral pulmonary interstitial prominence. Mild chest and heart failure and/or pneumonitis cannot be excluded. Prior cervical thoracic fusion. Neurostimulator noted . IMPRESSION: Cardiomegaly with bilateral mild pulmonary interstitial prominence. Mild congestive heart failure and/or pneumonitis cannot be excluded. Low lung volumes. Electronically Signed   By: Marcello Moores  Register   On: 04/04/2016 09:58   Ct Angio Chest Pe W And/or Wo Contrast  Result Date: 04/04/2016 CLINICAL DATA:  Cough and chest pain and pressure beginning last night. History of aortic dissection previously. EXAM: CT ANGIOGRAPHY CHEST WITH CONTRAST TECHNIQUE: Multidetector CT imaging of the chest was performed using the standard protocol during bolus administration of intravenous contrast. Multiplanar CT image reconstructions and MIPs were obtained to evaluate the vascular anatomy. CONTRAST:  75 cc Isovue 370 COMPARISON:  CT same day.  CT angiography 12/09/2015. FINDINGS: Cardiovascular: Chronic cardiomegaly. Small pericardial effusion. Extensive coronary artery calcification. Thoracic aortic atherosclerosis but no  evidence of aneurysm or dissection. Pulmonary arterial opacification is good and there are no pulmonary emboli. Mediastinum/Nodes: No enlarged hilar or mediastinal lymph nodes. No mass. Lungs/Pleura: The  lungs are clear.  No pleural fluid. Upper Abdomen: Chronic cirrhosis of the liver with portal venous hypertension and splenomegaly. Musculoskeletal: Chronic spinal neurostimulator. Ordinary thoracic spondylosis. Review of the MIP images confirms the above findings. IMPRESSION: No acute vascular pathology identifiable by CT. No evidence of aortic dissection or pulmonary emboli. Aortic and coronary artery atherosclerosis. Cardiomegaly. Small pericardial effusion. Cirrhosis and portal venous hypertension. Electronically Signed   By: Nelson Chimes M.D.   On: 04/04/2016 12:02      Management plans discussed with the patient and she is in agreement. Stable for discharge   Patient should follow up with pcp  CODE STATUS:     Code Status Orders        Start     Ordered   04/04/16 1531  Full code  Continuous     04/04/16 1531    Code Status History    Date Active Date Inactive Code Status Order ID Comments User Context   09/10/2015  2:04 PM 09/11/2015  6:33 PM Full Code [id number]  Yves Dill, RN Inpatient   09/09/2015 11:14 PM 09/10/2015  2:04 PM DNR [id number]  Demetrios Loll, MD ED      TOTAL TIME TAKING CARE OF THIS PATIENT: 36 minutes.    Note: This dictation was prepared with Dragon dictation along with smaller phrase technology. Any transcriptional errors that result from this process are unintentional.  Leshia Kope M.D on 04/05/2016 at 1:13 PM  Between 7am to 6pm - Pager - [phone] After 6pm go to [email or web address] - password [email or web address] [facility]  Office  [phone]  CC: Primary care physician; Sarajane Jews, MD

## 2016-04-05 NOTE — Clinical Social Work Note (Addendum)
MSW was informed that patient is from an ALF.  MSW found out patient is from [facility] [phone], they are able to accept her back today once she is medically ready for discharge and order has been received.  MSW to continue to follow patient's progress throughout discharge planning.  Patient to be d/c'ed today to [facility].  Patient and family agreeable to plans will transport via facility transportation.  Jones Broom. Linnet Bottari, MSW [phone]  Mon-Fri 8a-4:30p 04/05/2016 11:14 AM

## 2016-04-05 NOTE — Progress Notes (Signed)
Patient ready to go home. Voiding fine now. Paged Dr. Saunders Revel to ask about stress test results - waiting for call back.

## 2016-04-05 NOTE — Progress Notes (Signed)
Notified MD of Pt not voiding per my shift. In&Out Cath ordered. Will continue to assess and monitor

## 2016-04-05 NOTE — Progress Notes (Signed)
EMS taking pt now. IV removed. Packet sent with EMS. Discharge instructions and education given to patient. Understanding verbalized. Prescriptions e-prescribed to pharmacy.

## 2016-04-05 NOTE — Progress Notes (Signed)
Per Dr. Nehemiah Massed, stress test was normal. Dr. Benjie Karvonen paged. Proceeding with discharge per orders- packet prepared by Randall Hiss, SW. Facility called for transport, however they cannot transport this late. Randall Hiss, SW has prepared form for EMS transport. Facility aware she is coming [facility].

## 2016-04-05 NOTE — Progress Notes (Signed)
Patient refusing to wear tele box while waiting for EMS to arrive. Heart rate has been stable. Will continue to check on her. No distress at this time.

## 2016-04-07 LAB — URINE CULTURE

## 2016-04-08 NOTE — Progress Notes (Signed)
Patient ID: Stephanie [name], female   DOB: 07/28/1950, 65 y.o.   MRN: [id number]  Notice patient's urine culture from admission is growing resistant Escherichia coli. The Ceftin that she was sent home with it is resistant to that. I tried to call the patient but there was no answer.  I spoke with her pharmacist and called in Augmentin [phone] twice a day for 1 week. The pharmacy will call the patient and notify her about the change in medication.  Dr. Loletha Grayer

## 2016-04-11 ENCOUNTER — Encounter (INDEPENDENT_AMBULATORY_CARE_PROVIDER_SITE_OTHER): Payer: Medicare Other | Admitting: Vascular Surgery

## 2016-04-12 LAB — NM MYOCAR MULTI W/SPECT W/WALL MOTION / EF
CSEPEDS: 0 s
CSEPPHR: 99 {beats}/min
Estimated workload: 1 METS
Exercise duration (min): 1 min
LVDIAVOL: 78 mL (ref 46–106)
LVSYSVOL: 25 mL
MPHR: 115 {beats}/min
NUC STRESS TID: 0.94
Percent HR: 63 %
Rest HR: 74 {beats}/min
SDS: 0
SRS: 4
SSS: 0

## 2016-04-14 DIAGNOSIS — R269 Unspecified abnormalities of gait and mobility: Secondary | ICD-10-CM | POA: Diagnosis not present

## 2016-04-14 DIAGNOSIS — E119 Type 2 diabetes mellitus without complications: Secondary | ICD-10-CM | POA: Diagnosis not present

## 2016-04-14 DIAGNOSIS — M25561 Pain in right knee: Secondary | ICD-10-CM | POA: Diagnosis not present

## 2016-04-14 DIAGNOSIS — R600 Localized edema: Secondary | ICD-10-CM | POA: Diagnosis not present

## 2016-04-14 DIAGNOSIS — G629 Polyneuropathy, unspecified: Secondary | ICD-10-CM | POA: Diagnosis not present

## 2016-04-18 ENCOUNTER — Inpatient Hospital Stay
Admission: EM | Admit: 2016-04-18 | Discharge: 2016-04-21 | DRG: 193 | Disposition: A | Discharge status: Home Health | Payer: Medicare Other | Attending: Internal Medicine | Admitting: Internal Medicine

## 2016-04-18 ENCOUNTER — Encounter: Payer: Self-pay | Admitting: Emergency Medicine

## 2016-04-18 ENCOUNTER — Emergency Department: Payer: Medicare Other

## 2016-04-18 ENCOUNTER — Inpatient Hospital Stay: Payer: Medicare Other

## 2016-04-18 DIAGNOSIS — L03115 Cellulitis of right lower limb: Secondary | ICD-10-CM | POA: Diagnosis present

## 2016-04-18 DIAGNOSIS — M7989 Other specified soft tissue disorders: Secondary | ICD-10-CM | POA: Diagnosis not present

## 2016-04-18 DIAGNOSIS — Z794 Long term (current) use of insulin: Secondary | ICD-10-CM | POA: Diagnosis not present

## 2016-04-18 DIAGNOSIS — J180 Bronchopneumonia, unspecified organism: Secondary | ICD-10-CM | POA: Diagnosis not present

## 2016-04-18 DIAGNOSIS — G4733 Obstructive sleep apnea (adult) (pediatric): Secondary | ICD-10-CM | POA: Diagnosis present

## 2016-04-18 DIAGNOSIS — G2581 Restless legs syndrome: Secondary | ICD-10-CM | POA: Diagnosis not present

## 2016-04-18 DIAGNOSIS — D6959 Other secondary thrombocytopenia: Secondary | ICD-10-CM | POA: Diagnosis not present

## 2016-04-18 DIAGNOSIS — Y95 Nosocomial condition: Secondary | ICD-10-CM | POA: Diagnosis not present

## 2016-04-18 DIAGNOSIS — E1142 Type 2 diabetes mellitus with diabetic polyneuropathy: Secondary | ICD-10-CM | POA: Diagnosis not present

## 2016-04-18 DIAGNOSIS — R0902 Hypoxemia: Secondary | ICD-10-CM

## 2016-04-18 DIAGNOSIS — J440 Chronic obstructive pulmonary disease with acute lower respiratory infection: Secondary | ICD-10-CM | POA: Diagnosis present

## 2016-04-18 DIAGNOSIS — E785 Hyperlipidemia, unspecified: Secondary | ICD-10-CM | POA: Diagnosis present

## 2016-04-18 DIAGNOSIS — Z7951 Long term (current) use of inhaled steroids: Secondary | ICD-10-CM

## 2016-04-18 DIAGNOSIS — F419 Anxiety disorder, unspecified: Secondary | ICD-10-CM | POA: Diagnosis not present

## 2016-04-18 DIAGNOSIS — K219 Gastro-esophageal reflux disease without esophagitis: Secondary | ICD-10-CM | POA: Diagnosis present

## 2016-04-18 DIAGNOSIS — R0789 Other chest pain: Secondary | ICD-10-CM | POA: Diagnosis not present

## 2016-04-18 DIAGNOSIS — B9620 Unspecified Escherichia coli [E. coli] as the cause of diseases classified elsewhere: Secondary | ICD-10-CM | POA: Diagnosis not present

## 2016-04-18 DIAGNOSIS — F319 Bipolar disorder, unspecified: Secondary | ICD-10-CM | POA: Diagnosis not present

## 2016-04-18 DIAGNOSIS — K7460 Unspecified cirrhosis of liver: Secondary | ICD-10-CM | POA: Diagnosis present

## 2016-04-18 DIAGNOSIS — J189 Pneumonia, unspecified organism: Secondary | ICD-10-CM | POA: Diagnosis present

## 2016-04-18 DIAGNOSIS — E876 Hypokalemia: Secondary | ICD-10-CM | POA: Diagnosis not present

## 2016-04-18 DIAGNOSIS — R609 Edema, unspecified: Secondary | ICD-10-CM

## 2016-04-18 DIAGNOSIS — K7581 Nonalcoholic steatohepatitis (NASH): Secondary | ICD-10-CM | POA: Diagnosis present

## 2016-04-18 DIAGNOSIS — J9601 Acute respiratory failure with hypoxia: Secondary | ICD-10-CM | POA: Diagnosis not present

## 2016-04-18 DIAGNOSIS — Z1612 Extended spectrum beta lactamase (ESBL) resistance: Secondary | ICD-10-CM | POA: Diagnosis present

## 2016-04-18 DIAGNOSIS — Z87891 Personal history of nicotine dependence: Secondary | ICD-10-CM | POA: Diagnosis not present

## 2016-04-18 DIAGNOSIS — M6281 Muscle weakness (generalized): Secondary | ICD-10-CM

## 2016-04-18 DIAGNOSIS — I10 Essential (primary) hypertension: Secondary | ICD-10-CM | POA: Diagnosis present

## 2016-04-18 DIAGNOSIS — I959 Hypotension, unspecified: Secondary | ICD-10-CM | POA: Diagnosis not present

## 2016-04-18 DIAGNOSIS — R262 Difficulty in walking, not elsewhere classified: Secondary | ICD-10-CM

## 2016-04-18 DIAGNOSIS — N390 Urinary tract infection, site not specified: Secondary | ICD-10-CM | POA: Diagnosis not present

## 2016-04-18 DIAGNOSIS — G4700 Insomnia, unspecified: Secondary | ICD-10-CM | POA: Diagnosis not present

## 2016-04-18 DIAGNOSIS — R05 Cough: Secondary | ICD-10-CM | POA: Diagnosis not present

## 2016-04-18 DIAGNOSIS — E119 Type 2 diabetes mellitus without complications: Secondary | ICD-10-CM | POA: Diagnosis not present

## 2016-04-18 DIAGNOSIS — R079 Chest pain, unspecified: Secondary | ICD-10-CM | POA: Diagnosis not present

## 2016-04-18 LAB — BRAIN NATRIURETIC PEPTIDE: B Natriuretic Peptide: 143 pg/mL — ABNORMAL HIGH (ref 0.0–100.0)

## 2016-04-18 LAB — URINALYSIS COMPLETE WITH MICROSCOPIC (ARMC ONLY)
Bilirubin Urine: NEGATIVE
Glucose, UA: NEGATIVE mg/dL
Hgb urine dipstick: NEGATIVE
Ketones, ur: NEGATIVE mg/dL
Nitrite: POSITIVE — AB
PROTEIN: NEGATIVE mg/dL
Specific Gravity, Urine: 1.008 (ref 1.005–1.030)
pH: 8 (ref 5.0–8.0)

## 2016-04-18 LAB — CBC WITH DIFFERENTIAL/PLATELET
Basophils Absolute: 0 10*3/uL (ref 0–0.1)
Basophils Relative: 0 %
EOS ABS: 0.1 10*3/uL (ref 0–0.7)
Eosinophils Relative: 1 %
HEMATOCRIT: 38.8 % (ref 35.0–47.0)
HEMOGLOBIN: 13.1 g/dL (ref 12.0–16.0)
LYMPHS ABS: 0.7 10*3/uL — AB (ref 1.0–3.6)
Lymphocytes Relative: 7 %
MCH: 31.3 pg (ref 26.0–34.0)
MCHC: 33.6 g/dL (ref 32.0–36.0)
MCV: 93 fL (ref 80.0–100.0)
MONO ABS: 0.9 10*3/uL (ref 0.2–0.9)
MONOS PCT: 9 %
NEUTROS ABS: 8.5 10*3/uL — AB (ref 1.4–6.5)
NEUTROS PCT: 83 %
Platelets: 113 10*3/uL — ABNORMAL LOW (ref 150–440)
RBC: 4.17 MIL/uL (ref 3.80–5.20)
RDW: 16.4 % — ABNORMAL HIGH (ref 11.5–14.5)
WBC: 10.2 10*3/uL (ref 3.6–11.0)

## 2016-04-18 LAB — COMPREHENSIVE METABOLIC PANEL
ALBUMIN: 3.3 g/dL — AB (ref 3.5–5.0)
ALK PHOS: 130 U/L — AB (ref 38–126)
ALT: 20 U/L (ref 14–54)
AST: 32 U/L (ref 15–41)
Anion gap: 8 (ref 5–15)
BILIRUBIN TOTAL: 1.3 mg/dL — AB (ref 0.3–1.2)
BUN: 12 mg/dL (ref 6–20)
CO2: 32 mmol/L (ref 22–32)
CREATININE: 0.83 mg/dL (ref 0.44–1.00)
Calcium: 9.3 mg/dL (ref 8.9–10.3)
Chloride: 101 mmol/L (ref 101–111)
GFR calc Af Amer: >60 mL/min (ref >60)
GFR calc non Af Amer: >60 mL/min (ref >60)
GLUCOSE: 151 mg/dL — AB (ref 65–99)
POTASSIUM: 3.7 mmol/L (ref 3.5–5.1)
Sodium: 141 mmol/L (ref 135–145)
TOTAL PROTEIN: 7.1 g/dL (ref 6.5–8.1)

## 2016-04-18 LAB — GLUCOSE, CAPILLARY
GLUCOSE-CAPILLARY: 167 mg/dL — AB (ref 65–99)
Glucose-Capillary: 106 mg/dL — ABNORMAL HIGH (ref 65–99)
Glucose-Capillary: 122 mg/dL — ABNORMAL HIGH (ref 65–99)

## 2016-04-18 LAB — TROPONIN I

## 2016-04-18 LAB — LACTIC ACID, PLASMA: Lactic Acid, Venous: 1.6 mmol/L (ref 0.5–1.9)

## 2016-04-18 MED ORDER — FERROUS SULFATE 325 (65 FE) MG PO TABS
325.0000 mg | ORAL_TABLET | Freq: Two times a day (BID) | ORAL | Status: DC
  Administered 2016-04-18 – 2016-04-21 (×6): 325 mg via ORAL
  Filled 2016-04-18 (×5): qty 1

## 2016-04-18 MED ORDER — TRAMADOL HCL 50 MG PO TABS
50.0000 mg | ORAL_TABLET | Freq: Four times a day (QID) | ORAL | Status: DC | PRN
  Administered 2016-04-18 – 2016-04-20 (×3): 50 mg via ORAL
  Filled 2016-04-18 (×3): qty 1

## 2016-04-18 MED ORDER — METOCLOPRAMIDE HCL 10 MG PO TABS
5.0000 mg | ORAL_TABLET | Freq: Three times a day (TID) | ORAL | Status: DC
  Administered 2016-04-18 – 2016-04-21 (×11): 5 mg via ORAL
  Filled 2016-04-18 (×11): qty 1

## 2016-04-18 MED ORDER — DICYCLOMINE HCL 10 MG PO CAPS
10.0000 mg | ORAL_CAPSULE | Freq: Three times a day (TID) | ORAL | Status: DC
  Administered 2016-04-18 – 2016-04-21 (×8): 10 mg via ORAL
  Filled 2016-04-18 (×7): qty 1

## 2016-04-18 MED ORDER — VANCOMYCIN HCL IN DEXTROSE 1-5 GM/200ML-% IV SOLN
1000.0000 mg | Freq: Two times a day (BID) | INTRAVENOUS | Status: DC
  Administered 2016-04-18 – 2016-04-19 (×2): 1000 mg via INTRAVENOUS
  Filled 2016-04-18 (×3): qty 200

## 2016-04-18 MED ORDER — SIMVASTATIN 10 MG PO TABS
10.0000 mg | ORAL_TABLET | Freq: Every day | ORAL | Status: DC
  Administered 2016-04-18 – 2016-04-20 (×3): 10 mg via ORAL
  Filled 2016-04-18 (×4): qty 1

## 2016-04-18 MED ORDER — INSULIN ASPART 100 UNIT/ML ~~LOC~~ SOLN
0.0000 [IU] | Freq: Three times a day (TID) | SUBCUTANEOUS | Status: DC
  Administered 2016-04-18 – 2016-04-19 (×2): 2 [IU] via SUBCUTANEOUS
  Administered 2016-04-19 – 2016-04-20 (×2): 1 [IU] via SUBCUTANEOUS
  Filled 2016-04-18: qty 5
  Filled 2016-04-18: qty 1
  Filled 2016-04-18 (×2): qty 2
  Filled 2016-04-18: qty 1

## 2016-04-18 MED ORDER — OXYCODONE HCL 5 MG PO TABS
5.0000 mg | ORAL_TABLET | Freq: Two times a day (BID) | ORAL | Status: DC | PRN
  Administered 2016-04-19: 08:00:00 5 mg via ORAL
  Filled 2016-04-18: qty 1

## 2016-04-18 MED ORDER — BENZONATATE 100 MG PO CAPS
200.0000 mg | ORAL_CAPSULE | Freq: Three times a day (TID) | ORAL | Status: AC
Start: 2016-04-18 — End: 2016-04-20
  Administered 2016-04-18 – 2016-04-20 (×6): 200 mg via ORAL
  Filled 2016-04-18 (×6): qty 2

## 2016-04-18 MED ORDER — SODIUM CHLORIDE 0.9 % IV BOLUS (SEPSIS)
500.0000 mL | Freq: Once | INTRAVENOUS | Status: AC
  Administered 2016-04-18: 500 mL via INTRAVENOUS

## 2016-04-18 MED ORDER — VANCOMYCIN HCL IN DEXTROSE 1-5 GM/200ML-% IV SOLN
1000.0000 mg | Freq: Once | INTRAVENOUS | Status: AC
  Administered 2016-04-18: 1000 mg via INTRAVENOUS
  Filled 2016-04-18: qty 200

## 2016-04-18 MED ORDER — AMPICILLIN-SULBACTAM SODIUM 3 (2-1) G IJ SOLR
3.0000 g | Freq: Four times a day (QID) | INTRAMUSCULAR | Status: DC
  Administered 2016-04-18 – 2016-04-19 (×2): 3 g via INTRAVENOUS
  Filled 2016-04-18 (×3): qty 3

## 2016-04-18 MED ORDER — VITAMIN C 500 MG PO TABS
500.0000 mg | ORAL_TABLET | Freq: Two times a day (BID) | ORAL | Status: DC
  Administered 2016-04-18 – 2016-04-21 (×6): 500 mg via ORAL
  Filled 2016-04-18 (×6): qty 1

## 2016-04-18 MED ORDER — DEXTROSE 5 % IV SOLN
500.0000 mg | INTRAVENOUS | Status: DC
  Filled 2016-04-18: qty 500

## 2016-04-18 MED ORDER — LORAZEPAM 0.5 MG PO TABS
0.5000 mg | ORAL_TABLET | Freq: Every day | ORAL | Status: DC | PRN
Start: 2016-04-18 — End: 2016-04-21

## 2016-04-18 MED ORDER — CLONAZEPAM 0.5 MG PO TABS
0.5000 mg | ORAL_TABLET | Freq: Two times a day (BID) | ORAL | Status: DC
  Administered 2016-04-18 – 2016-04-21 (×6): 0.5 mg via ORAL
  Filled 2016-04-18 (×6): qty 1

## 2016-04-18 MED ORDER — PIOGLITAZONE HCL 30 MG PO TABS: 30.0000 mg | ORAL_TABLET | Freq: Every day | ORAL | Status: DC

## 2016-04-18 MED ORDER — ROPINIROLE HCL 1 MG PO TABS
3.0000 mg | ORAL_TABLET | Freq: Every day | ORAL | Status: DC
  Administered 2016-04-18 – 2016-04-20 (×3): 3 mg via ORAL
  Filled 2016-04-18 (×4): qty 3

## 2016-04-18 MED ORDER — DEXTROSE 5 % IV SOLN: 1.0000 g | INTRAVENOUS | Status: DC

## 2016-04-18 MED ORDER — PIPERACILLIN-TAZOBACTAM 3.375 G IVPB 30 MIN
3.3750 g | Freq: Once | INTRAVENOUS | Status: AC
  Administered 2016-04-18: 3.375 g via INTRAVENOUS
  Filled 2016-04-18: qty 50

## 2016-04-18 MED ORDER — ENOXAPARIN SODIUM 40 MG/0.4ML ~~LOC~~ SOLN
40.0000 mg | Freq: Two times a day (BID) | SUBCUTANEOUS | Status: DC
  Administered 2016-04-18 – 2016-04-20 (×5): 40 mg via SUBCUTANEOUS
  Filled 2016-04-18 (×4): qty 0.4

## 2016-04-18 MED ORDER — GUAIFENESIN ER 600 MG PO TB12
600.0000 mg | ORAL_TABLET | Freq: Two times a day (BID) | ORAL | Status: DC
  Administered 2016-04-18 – 2016-04-21 (×6): 600 mg via ORAL
  Filled 2016-04-18 (×6): qty 1

## 2016-04-18 MED ORDER — ACETAMINOPHEN 325 MG PO TABS: 650.0000 mg | ORAL_TABLET | Freq: Four times a day (QID) | ORAL | Status: DC | PRN

## 2016-04-18 MED ORDER — LAMOTRIGINE 100 MG PO TABS
100.0000 mg | ORAL_TABLET | Freq: Two times a day (BID) | ORAL | Status: DC
  Administered 2016-04-18 – 2016-04-21 (×6): 100 mg via ORAL
  Filled 2016-04-18 (×6): qty 1

## 2016-04-18 MED ORDER — SENNA 8.6 MG PO TABS
1.0000 | ORAL_TABLET | ORAL | Status: DC
  Administered 2016-04-20: 10:00:00 8.6 mg via ORAL
  Filled 2016-04-18: qty 1

## 2016-04-18 MED ORDER — MOMETASONE FURO-FORMOTEROL FUM 200-5 MCG/ACT IN AERO
2.0000 | INHALATION_SPRAY | Freq: Two times a day (BID) | RESPIRATORY_TRACT | Status: DC
  Administered 2016-04-19 – 2016-04-21 (×5): 2 via RESPIRATORY_TRACT
  Filled 2016-04-18: qty 8.8

## 2016-04-18 MED ORDER — LACTULOSE 10 GM/15ML PO SOLN
10.0000 g | Freq: Every day | ORAL | Status: DC
  Administered 2016-04-19 – 2016-04-20 (×2): 10 g via ORAL
  Filled 2016-04-18 (×2): qty 30

## 2016-04-18 MED ORDER — LEVOFLOXACIN 750 MG PO TABS
750.0000 mg | ORAL_TABLET | Freq: Every day | ORAL | Status: DC
  Administered 2016-04-18: 750 mg via ORAL
  Filled 2016-04-18: qty 1

## 2016-04-18 MED ORDER — PREGABALIN 75 MG PO CAPS
150.0000 mg | ORAL_CAPSULE | Freq: Three times a day (TID) | ORAL | Status: DC
  Administered 2016-04-18 – 2016-04-19 (×5): 150 mg via ORAL
  Filled 2016-04-18 (×5): qty 2

## 2016-04-18 MED ORDER — OXYBUTYNIN CHLORIDE ER 5 MG PO TB24
5.0000 mg | ORAL_TABLET | Freq: Every day | ORAL | Status: DC
  Administered 2016-04-19 – 2016-04-21 (×3): 5 mg via ORAL
  Filled 2016-04-18 (×4): qty 1

## 2016-04-18 MED ORDER — ADULT MULTIVITAMIN W/MINERALS CH
1.0000 | ORAL_TABLET | Freq: Every day | ORAL | Status: DC
  Administered 2016-04-19 – 2016-04-21 (×3): 1 via ORAL
  Filled 2016-04-18 (×3): qty 1

## 2016-04-18 MED ORDER — ARIPIPRAZOLE 10 MG PO TABS
30.0000 mg | ORAL_TABLET | Freq: Every day | ORAL | Status: DC
Start: 2016-04-18 — End: 2016-04-21
  Administered 2016-04-18 – 2016-04-21 (×4): 30 mg via ORAL
  Filled 2016-04-18 (×2): qty 3
  Filled 2016-04-18: qty 6
  Filled 2016-04-18 (×2): qty 3

## 2016-04-18 MED ORDER — METFORMIN HCL 500 MG PO TABS: 500.0000 mg | ORAL_TABLET | Freq: Every day | ORAL | Status: DC

## 2016-04-18 MED ORDER — PANTOPRAZOLE SODIUM 40 MG PO TBEC
40.0000 mg | DELAYED_RELEASE_TABLET | Freq: Every day | ORAL | Status: DC
  Administered 2016-04-19 – 2016-04-21 (×3): 40 mg via ORAL
  Filled 2016-04-18 (×3): qty 1

## 2016-04-18 MED ORDER — SPIRONOLACTONE 25 MG PO TABS: 25.0000 mg | ORAL_TABLET | Freq: Every day | ORAL | Status: DC

## 2016-04-18 MED ORDER — IPRATROPIUM-ALBUTEROL 0.5-2.5 (3) MG/3ML IN SOLN: 3.0000 mL | Freq: Four times a day (QID) | RESPIRATORY_TRACT | Status: DC | PRN

## 2016-04-18 MED ORDER — BUPROPION HCL ER (SR) 150 MG PO TB12
150.0000 mg | ORAL_TABLET | Freq: Every day | ORAL | Status: DC
  Administered 2016-04-18 – 2016-04-21 (×4): 150 mg via ORAL
  Filled 2016-04-18 (×4): qty 1

## 2016-04-18 MED ORDER — ORAL CARE MOUTH RINSE
15.0000 mL | Freq: Two times a day (BID) | OROMUCOSAL | Status: DC
  Administered 2016-04-18 – 2016-04-21 (×6): 15 mL via OROMUCOSAL

## 2016-04-18 MED ORDER — METFORMIN HCL 500 MG PO TABS
1000.0000 mg | ORAL_TABLET | Freq: Every day | ORAL | Status: DC
  Administered 2016-04-19 – 2016-04-21 (×3): 1000 mg via ORAL
  Filled 2016-04-18 (×3): qty 2

## 2016-04-18 MED ORDER — MIRTAZAPINE 15 MG PO TABS
15.0000 mg | ORAL_TABLET | Freq: Every day | ORAL | Status: DC
  Administered 2016-04-18 – 2016-04-20 (×3): 15 mg via ORAL
  Filled 2016-04-18 (×3): qty 1

## 2016-04-18 MED ORDER — ZOLPIDEM TARTRATE 5 MG PO TABS
5.0000 mg | ORAL_TABLET | Freq: Every day | ORAL | Status: DC
  Administered 2016-04-18 – 2016-04-20 (×3): 5 mg via ORAL
  Filled 2016-04-18 (×3): qty 1

## 2016-04-18 MED ORDER — LOPERAMIDE HCL 2 MG PO CAPS: 4.0000 mg | ORAL_CAPSULE | ORAL | Status: DC | PRN

## 2016-04-18 MED ORDER — METFORMIN HCL 500 MG PO TABS
500.0000 mg | ORAL_TABLET | Freq: Every day | ORAL | Status: DC
  Administered 2016-04-18 – 2016-04-20 (×3): 500 mg via ORAL
  Filled 2016-04-18 (×3): qty 1

## 2016-04-18 MED ORDER — INSULIN ASPART 100 UNIT/ML ~~LOC~~ SOLN: 0.0000 [IU] | Freq: Every day | SUBCUTANEOUS | Status: DC

## 2016-04-18 MED ORDER — SODIUM CHLORIDE 0.9 % IV SOLN
INTRAVENOUS | Status: DC
  Administered 2016-04-18 – 2016-04-20 (×4): via INTRAVENOUS

## 2016-04-18 MED ORDER — GABAPENTIN 600 MG PO TABS
600.0000 mg | ORAL_TABLET | Freq: Two times a day (BID) | ORAL | Status: DC
  Administered 2016-04-18 – 2016-04-21 (×6): 600 mg via ORAL
  Filled 2016-04-18 (×6): qty 1

## 2016-04-18 MED ORDER — INSULIN GLARGINE 100 UNIT/ML ~~LOC~~ SOLN
20.0000 [IU] | Freq: Every day | SUBCUTANEOUS | Status: DC
  Administered 2016-04-18 – 2016-04-20 (×3): 20 [IU] via SUBCUTANEOUS
  Filled 2016-04-18 (×4): qty 0.2

## 2016-04-18 NOTE — H&P (Signed)
[facility] [place] NAME: Stephanie [name]    MR#:  [id number]  DATE OF BIRTH:  [birth year]  DATE OF ADMISSION:  04/18/2016  PRIMARY CARE PHYSICIAN: Sarajane Jews, MD   REQUESTING/REFERRING PHYSICIAN: Dr. Conni Slipper  CHIEF COMPLAINT:   Chief Complaint  Patient presents with  . Cough  . Chest Pain    HISTORY OF PRESENT ILLNESS:  Stephanie [name]  is a 65 y.o. female with a known history of Hypertension, insulin-dependent diabetes mellitus, liver cirrhosis due to NASH, chronic back pain, who was just in the hospital 2 weeks ago for chest pain and UTI presents again secondary to worsening cough and fevers and chills. Patient states that she attended a party about 3 days ago, starting that evening she started to have cough and nasal congestion. For the last couple of days she also has been having fevers, chills, productive wet cough and difficulty breathing. She says that her chest hurts whenever she is taking deep breaths. She was recently discharged on Ceftin for UTI about 2 weeks ago. White count is normal, blood cultures are pending. Lactic acid is within normal limits. However chest x-ray shows right lung bronchopneumonia. She also complains of increased frequency of urination, however has been on Lasix for lower extremity swelling. She has chronic lower extremity edema, however her right leg is more erythematous and tender today. She is being admitted for pneumonia and cellulitis.  PAST MEDICAL HISTORY:   Past Medical History:  Diagnosis Date  . Adenomatous colon polyp 08/27/2014   Polyps x 3  . Anemia   . Anxiety   . 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 Arthritis   . Asthma   . Bipolar affective disorder ([place])    h/o  . Blood transfusion 2000  . Blood transfusion without reported diagnosis   . Chronic abdominal pain    abdominal wall pain  . Cirrhosis ([place])    related to NASH  . Colon polyp   . Depression   . Diabetes mellitus  without complication ([place])   . Fatty liver 04/09/08   found in abd CT  . GERD (gastroesophageal reflux disease)   . H/O: CVA (cardiovascular accident)    TIA  . H/O: rheumatic fever   . Hyperlipidemia   . Incontinence   . Insomnia   . Obstructive sleep apnea    not using CPAP, last sleep study 2.5 years ago, per pt doctor is aware  . Platelets decreased ([place])   . RLS (restless legs syndrome)   . Shortness of breath   . Sleep apnea   . Stroke [facility])    TIA- 2002  . Ulcer ([place])   . Unspecified disorders of nervous system     PAST SURGICAL HISTORY:   Past Surgical History:  Procedure Laterality Date  . ABDOMINAL EXPLORATION SURGERY    . ABDOMINAL HYSTERECTOMY     total  . Axillary artery cannulation via 8-mm Hemashield graft, median sternotomy, extracorporeal circulation with deep hypothermic circulatory arrest, repair of  aortic dessection  01/23/2009   Dr Arlyce Dice  . BACK SURGERY     x 45  . cataract     both eyes  . CHOLECYSTECTOMY    . COLONOSCOPY    . EYE SURGERY     bilat  . HEMORROIDECTOMY     and colon polyp removed  . LIVER BIOPSY  04/10/2012   Procedure: LIVER BIOPSY;  Surgeon: Inda Castle, MD;  Location: [facility];  Service: Endoscopy;  Laterality: N/A;  ultrasound to mark order for abd limited/liver to be marked to be sent by [email or web address]   . LUMBAR FUSION  10/09  . LUMBAR WOUND DEBRIDEMENT  05/09/2011   Procedure: LUMBAR WOUND DEBRIDEMENT;  Surgeon: Dahlia Bailiff;  Location: [place];  Service: Orthopedics;  Laterality: N/A;  IRRIGATION AND DEBRIDEMENT SPINAL WOUND  . POSTERIOR CERVICAL FUSION/FORAMINOTOMY    . ROTATOR CUFF REPAIR     left    SOCIAL HISTORY:   Social History  Substance Use Topics  . Smoking status: Former Smoker    Packs/day: 1.50    Years: 50.00    Types: Cigarettes    Quit date: 04/06/2013  . Smokeless tobacco: Never Used  . Alcohol use No    FAMILY HISTORY:   Family History  Problem Relation Age of Onset  . Breast cancer  Mother   . Lung cancer Mother   . Stomach cancer Father   . Diabetes      4 aunts, and 1 uncle  . Colon cancer Neg Hx   . Esophageal cancer Neg Hx   . Rectal cancer Neg Hx     DRUG ALLERGIES:   Allergies  Allergen Reactions  . Doxycycline Hives, Swelling and Other (See Comments)    Reaction:  Facial swelling     REVIEW OF SYSTEMS:   Review of Systems  Constitutional: Positive for fever and malaise/fatigue. Negative for chills and weight loss.  HENT: Negative for ear discharge, ear pain, hearing loss and nosebleeds.   Eyes: Negative for blurred vision, double vision and photophobia.  Respiratory: Positive for cough and shortness of breath. Negative for hemoptysis and wheezing.   Cardiovascular: Positive for chest pain and leg swelling. Negative for palpitations and orthopnea.  Gastrointestinal: Negative for abdominal pain, constipation, diarrhea, melena, nausea and vomiting.  Genitourinary: Positive for frequency. Negative for dysuria and urgency.  Musculoskeletal: Positive for myalgias. Negative for back pain and neck pain.  Skin: Negative for rash.  Neurological: Negative for dizziness, tremors, sensory change, speech change, focal weakness and headaches.  Endo/Heme/Allergies: Does not bruise/bleed easily.  Psychiatric/Behavioral: Negative for depression.    MEDICATIONS AT HOME:   Prior to Admission medications   Medication Sig Start Date End Date Taking? Authorizing Provider  albuterol (VENTOLIN HFA) 108 (90 Base) MCG/ACT inhaler Inhale 2 puffs into the lungs every 6 (six) hours as needed for wheezing or shortness of breath. 12/24/15  Yes Chesley Mires, MD  ARIPiprazole (ABILIFY) 30 MG tablet Take 30 mg by mouth daily.   Yes Historical Provider, MD  budesonide-formoterol (SYMBICORT) 160-4.5 MCG/ACT inhaler Inhale 2 puffs into the lungs 2 (two) times daily.   Yes Historical Provider, MD  buPROPion (WELLBUTRIN SR) 150 MG 12 hr tablet Take 150 mg by mouth daily.   Yes Historical  Provider, MD  clonazePAM (KLONOPIN) 0.5 MG tablet Take 0.5 mg by mouth 2 (two) times daily.   Yes Historical Provider, MD  dicyclomine (BENTYL) 10 MG capsule Take 10 mg by mouth 3 (three) times daily.    Yes Historical Provider, MD  ferrous sulfate 325 (65 FE) MG tablet Take 325 mg by mouth 2 (two) times daily.    Yes Historical Provider, MD  furosemide (LASIX) 80 MG tablet Take 80 mg by mouth daily.   Yes Historical Provider, MD  gabapentin (NEURONTIN) 600 MG tablet Take 600 mg by mouth 2 (two) times daily.    Yes Historical Provider, MD  insulin glargine (LANTUS) 100 UNIT/ML  injection Inject 20 Units into the skin at bedtime.   Yes Historical Provider, MD  insulin lispro (HUMALOG) 100 UNIT/ML injection Inject 0-9 Units into the skin 3 (three) times daily before meals. Pt uses as needed per sliding scale:    150-200:  0 units  201-250:  5 units  251-300:  8 units 301-350:  9 units  Greater than 350:  Call MD   Yes Historical Provider, MD  lactulose (CHRONULAC) 10 GM/15ML solution Take 10 g by mouth daily.   Yes Historical Provider, MD  lamoTRIgine (LAMICTAL) 100 MG tablet Take 100 mg by mouth 2 (two) times daily.   Yes Historical Provider, MD  loperamide (IMODIUM) 2 MG capsule Take 4 mg by mouth as needed for diarrhea or loose stools. *Do not exceed 8 doses in 24 hours*   Yes Historical Provider, MD  LORazepam (ATIVAN) 0.5 MG tablet Take 0.5 mg by mouth daily as needed for anxiety.    Yes Historical Provider, MD  metFORMIN (GLUCOPHAGE) 500 MG tablet Take 500 mg by mouth daily. Take 2 tablets (1000 mg) by mouth every morning with meal, and take 1 tablet by mouth every evening at 5 pm with meal.   Yes Historical Provider, MD  metoCLOPramide (REGLAN) 5 MG tablet Take 5 mg by mouth 4 (four) times daily. Before meals and at bedtime   Yes Historical Provider, MD  mirtazapine (REMERON) 15 MG tablet Take 15 mg by mouth at bedtime.   Yes Historical Provider, MD  omeprazole (PRILOSEC) 40 MG capsule Take  40 mg by mouth daily before breakfast.   Yes Historical Provider, MD  oxybutynin (DITROPAN-XL) 5 MG 24 hr tablet Take 5 mg by mouth daily.   Yes Historical Provider, MD  oxyCODONE (OXY IR/ROXICODONE) 5 MG immediate release tablet Take 5 mg by mouth every 12 (twelve) hours as needed for severe pain.    Yes Historical Provider, MD  pioglitazone (ACTOS) 30 MG tablet Take 30 mg by mouth daily.   Yes Historical Provider, MD  potassium chloride SA (K-DUR,KLOR-CON) 20 MEQ tablet Take 20 mEq by mouth daily. *Note dose*   Yes Historical Provider, MD  pregabalin (LYRICA) 150 MG capsule Take 150 mg by mouth 3 (three) times daily.    Yes Historical Provider, MD  rOPINIRole (REQUIP) 3 MG tablet Take 3 mg by mouth at bedtime.   Yes Historical Provider, MD  senna (SENOKOT) 8.6 MG tablet Take 1 tablet by mouth 3 (three) times a week. Take on Monday, Wednesday, and Friday. *Hold for loose stools*   Yes Historical Provider, MD  simvastatin (ZOCOR) 10 MG tablet Take 10 mg by mouth at bedtime.    Yes Historical Provider, MD  spironolactone (ALDACTONE) 25 MG tablet Take 25 mg by mouth daily.   Yes Historical Provider, MD  traMADol (ULTRAM) 50 MG tablet Take 50 mg by mouth every 6 (six) hours as needed for moderate pain.   Yes Historical Provider, MD  vitamin C (ASCORBIC ACID) 500 MG tablet Take 500 mg by mouth 2 (two) times daily.   Yes Historical Provider, MD  zolpidem (AMBIEN) 5 MG tablet Take 5 mg by mouth at bedtime.    Yes Historical Provider, MD  acetaminophen (TYLENOL) 325 MG tablet Take 650 mg by mouth every 6 (six) hours as needed.    Historical Provider, MD  Multiple Vitamin (MULTIVITAMIN WITH MINERALS) TABS tablet Take 1 tablet by mouth daily with breakfast.     Historical Provider, MD      VITAL SIGNS:  Blood pressure (!) 101/56, pulse 89, temperature 99.6 F (37.6 C), temperature source Oral, resp. rate 16, height 5' (1.524 m), weight 101.6 kg (224 lb), SpO2 96 %.  PHYSICAL EXAMINATION:   Physical  Exam  GENERAL:  65 y.o.-year-old patient lying in the bed with no acute distress.  EYES: Pupils equal, round, reactive to light and accommodation. No scleral icterus. Extraocular muscles intact.  HEENT: Head atraumatic, normocephalic. Oropharynx and nasopharynx clear.  NECK:  Supple, no jugular venous distention. No thyroid enlargement, no tenderness.  LUNGS: coarse breath sounds bilaterally, no wheezing, rales,rhonchi or crepitation. No use of accessory muscles of respiration. Diminished bibasilar breath sounds. CARDIOVASCULAR: S1, S2 normal. No murmurs, rubs, or gallops.  ABDOMEN: Soft, nontender, nondistended. Bowel sounds present. No organomegaly or mass.  EXTREMITIES: No cyanosis, or clubbing. 2+ lower extremity edema present, right leg is more erythematous and tender from mid leg to the ankle NEUROLOGIC: Cranial nerves II through XII are intact. Muscle strength 5/5 in all extremities. Sensation intact. Gait not checked.  PSYCHIATRIC: The patient is alert and oriented x 3.  SKIN: No obvious rash, lesion, or ulcer.   LABORATORY PANEL:   CBC  Recent Labs Lab 04/18/16 0906  WBC 10.2  HGB 13.1  HCT 38.8  PLT 113*   ------------------------------------------------------------------------------------------------------------------  Chemistries   Recent Labs Lab 04/18/16 0906  NA 141  K 3.7  CL 101  CO2 32  GLUCOSE 151*  BUN 12  CREATININE 0.83  CALCIUM 9.3  AST 32  ALT 20  ALKPHOS 130*  BILITOT 1.3*   ------------------------------------------------------------------------------------------------------------------  Cardiac Enzymes  Recent Labs Lab 04/18/16 0906  TROPONINI <0.03   ------------------------------------------------------------------------------------------------------------------  RADIOLOGY:  Dg Chest 2 View  Result Date: 04/18/2016 CLINICAL DATA:  65 year old female with nonproductive cough for 4 days. Chest pain. Fever and hypoxia. Initial  encounter. EXAM: CHEST  2 VIEW COMPARISON:  Chest radiographs and chest CT 04/04/2016 and earlier. FINDINGS: Large body habitus. Cervical ACDF hardware. Lower thoracic spinal stimulator device again noted. Stable lung volumes. On the frontal view there is decreased visualization of the right hemidiaphragm, but this is not correlated on the lateral view. No pneumothorax. Stable pulmonary vascularity without overt edema. No pleural effusion. Stable cardiac size and mediastinal contours. No acute osseous abnormality identified. IMPRESSION: 1. Decreased ventilation at the anterior right lung base since 04/04/2016. This might represent atelectasis but is suspicious for bronchopneumonia in this clinical setting. No pleural effusion. 2. Otherwise stable chest. Electronically Signed   By: Genevie Ann M.D.   On: 04/18/2016 11:37    EKG:   Orders placed or performed during the hospital encounter of 04/18/16  . ED EKG within 10 minutes  . ED EKG within 10 minutes  . EKG 12-Lead  . EKG 12-Lead  . ED EKG  . ED EKG  . EKG 12-Lead  . EKG 12-Lead   *Note: Due to a large number of results and/or encounters for the requested time period, some results have not been displayed. A complete set of results can be found in Results Review.    IMPRESSION AND PLAN:   Challis Crill  is a 65 y.o. female with a known history of Hypertension, insulin-dependent diabetes mellitus, liver cirrhosis due to NASH, chronic back pain, who was just in the hospital 2 weeks ago for chest pain and UTI presents again secondary to worsening cough and fevers and chills.  #1 community-acquired pneumonia- RLL pneumonia, blood cultures are pending. MRSA PCR is pending. -Recently discharged on Ceftin for a  week about 2 weeks ago. We'll start on Levaquin. -Cough medications ordered. Nebs when necessary. -Wean oxygen as tolerated  #2 right lower extremity cellulitis-check Dopplers to rule out DVT. Keep the leg elevated. On vancomycin at this  time  #3 diabetes mellitus-on Lantus, sliding scale insulin. Also on metformin. Hold Actos with her chronic lower extremity edema.? CHF  #4 COPD-stable. Duo nebs as needed. Continue inhalers. No indication for systemic steroids  #5 depression and anxiety-continue home medications. On Remeron, Ativan, Klonopin, wellbutrin and Abilify  #6 peripheral neuropathy-patient on high doses of Lyrica and gabapentin at home, continue  #7 DVT prophylaxis-on Lovenox  Echo therapy consulted. Patient is from [facility] living [facility]    All the records are reviewed and case discussed with ED provider. Management plans discussed with the patient, family and they are in agreement.  CODE STATUS: Full code   TOTAL TIME TAKING CARE OF THIS PATIENT: 50 minutes.    Gladstone Lighter M.D on 04/18/2016 at 1:57 PM  Between 7am to 6pm - Pager - [phone]  After 6pm go to [email or web address] - password [email or web address] [facility]  Office  [phone]  CC: Primary care physician; Sarajane Jews, MD

## 2016-04-18 NOTE — Progress Notes (Signed)
Anticoagulation monitoring(Lovenox):  65 yo female ordered Lovenox 40 mg Q24h  Filed Weights   04/18/16 0850 04/18/16 1400  Weight: 224 lb (101.6 kg) 224 lb 3.2 oz (101.7 kg)   BMI 43.9   Lab Results  Component Value Date   CREATININE 0.83 04/18/2016   CREATININE 0.96 04/05/2016   CREATININE 1.06 (H) 04/04/2016   Estimated Creatinine Clearance: 72.5 mL/min (by C-G formula based on SCr of 0.83 mg/dL). Hemoglobin & Hematocrit     Component Value Date/Time   HGB 13.1 04/18/2016 0906   HGB 11.7 (L) 09/29/2014 0920   HCT 38.8 04/18/2016 0906   HCT 34.7 (L) 09/29/2014 0920     Per Protocol for Patient with estCrcl > 30 ml/min and BMI > 40, will transition to Lovenox 40 mg Q12h.

## 2016-04-18 NOTE — ED Notes (Signed)
Pt up to toilet with this RN assistance. Pt denied becoming SOB with movement. Pt urinated in toilet and laid back in bed. No distress noted at this time. Lights turned back out for pt so she could rest.

## 2016-04-18 NOTE — ED Notes (Signed)
Pt stated she was sleepy, lights dimmed so pt could take nap. Pt lying on stretcher, side rails up. Sheet covering pt. Talking in complete sentences, no distress noted.

## 2016-04-18 NOTE — Progress Notes (Addendum)
ANTIBIOTIC CONSULT NOTE - INITIAL  Pharmacy Consult for Azithromycin/Unasyn  Indication: CAP  Allergies  Allergen Reactions  . Doxycycline Hives, Swelling and Other (See Comments)    Reaction:  Facial swelling     Patient Measurements: Height: 5' (152.4 cm) Weight: 224 lb 3.2 oz (101.7 kg) IBW/kg (Calculated) : 45.5 Adjusted Body Weight:   Vital Signs: Temp: 98.3 F (36.8 C) (11/13 2036) Temp Source: Oral (11/13 2036) BP: 91/44 (11/13 2036) Pulse Rate: 80 (11/13 2036) Intake/Output from previous day: No intake/output data recorded. Intake/Output from this shift: No intake/output data recorded.  Labs:  Recent Labs  04/18/16 0906  WBC 10.2  HGB 13.1  PLT 113*  CREATININE 0.83   Estimated Creatinine Clearance: 72.5 mL/min (by C-G formula based on SCr of 0.83 mg/dL). No results for input(s): VANCOTROUGH, VANCOPEAK, VANCORANDOM, GENTTROUGH, GENTPEAK, GENTRANDOM, TOBRATROUGH, TOBRAPEAK, TOBRARND, AMIKACINPEAK, AMIKACINTROU, AMIKACIN in the last 72 hours.   Microbiology: Recent Results (from the past 720 hour(s))  Urine culture     Status: Abnormal   Collection Time: 04/04/16  9:29 AM  Result Value Ref Range Status   Specimen Description URINE, CLEAN CATCH  Final   Special Requests NONE  Final   Culture (A)  Final    >=100,000 COLONIES/mL ESCHERICHIA COLI Confirmed Extended Spectrum Beta-Lactamase Producer (ESBL) Performed at [facility]    Report Status 04/07/2016 FINAL  Final   Organism ID, Bacteria ESCHERICHIA COLI (A)  Final      Susceptibility   Escherichia coli - MIC*    AMPICILLIN >=32 RESISTANT Resistant     CEFAZOLIN >=64 RESISTANT Resistant     CEFTRIAXONE >=64 RESISTANT Resistant     CIPROFLOXACIN >=4 RESISTANT Resistant     GENTAMICIN <=1 SENSITIVE Sensitive     IMIPENEM <=0.25 SENSITIVE Sensitive     NITROFURANTOIN 32 SENSITIVE Sensitive     TRIMETH/SULFA >=320 RESISTANT Resistant     AMPICILLIN/SULBACTAM 4 SENSITIVE Sensitive      PIP/TAZO <=4 SENSITIVE Sensitive     Extended ESBL POSITIVE Resistant     * >=100,000 COLONIES/mL ESCHERICHIA COLI  MRSA PCR Screening     Status: None   Collection Time: 04/04/16  5:21 PM  Result Value Ref Range Status   MRSA by PCR NEGATIVE NEGATIVE Final    Comment:        The GeneXpert MRSA Assay (FDA approved for NASAL specimens only), is one component of a comprehensive MRSA colonization surveillance program. It is not intended to diagnose MRSA infection nor to guide or monitor treatment for MRSA infections.     Medical History: Past Medical History:  Diagnosis Date  . Adenomatous colon polyp 08/27/2014   Polyps x 3  . Anemia   . Anxiety   . 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 Arthritis   . Asthma   . Bipolar affective disorder ([place])    h/o  . Blood transfusion 2000  . Blood transfusion without reported diagnosis   . Chronic abdominal pain    abdominal wall pain  . Cirrhosis ([place])    related to NASH  . Colon polyp   . Depression   . Diabetes mellitus without complication ([place])   . Fatty liver 04/09/08   found in abd CT  . GERD (gastroesophageal reflux disease)   . H/O: CVA (cardiovascular accident)    TIA  . H/O: rheumatic fever   . Hyperlipidemia   . Incontinence   . Insomnia   . Obstructive sleep apnea  not using CPAP, last sleep study 2.5 years ago, per pt doctor is aware  . Platelets decreased ([place])   . RLS (restless legs syndrome)   . Shortness of breath   . Sleep apnea   . Stroke [facility])    TIA- 2002  . Ulcer ([place])   . Unspecified disorders of nervous system     Medications:  Prescriptions Prior to Admission  Medication Sig Dispense Refill Last Dose  . albuterol (VENTOLIN HFA) 108 (90 Base) MCG/ACT inhaler Inhale 2 puffs into the lungs every 6 (six) hours as needed for wheezing or shortness of breath. 1 Inhaler 11 prn at prn  . ARIPiprazole (ABILIFY) 30 MG tablet Take 30 mg by mouth daily.   04/17/2016 at 0800  . budesonide-formoterol  (SYMBICORT) 160-4.5 MCG/ACT inhaler Inhale 2 puffs into the lungs 2 (two) times daily.   04/17/2016 at 0800  . buPROPion (WELLBUTRIN SR) 150 MG 12 hr tablet Take 150 mg by mouth daily.   04/17/2016 at 0800  . clonazePAM (KLONOPIN) 0.5 MG tablet Take 0.5 mg by mouth 2 (two) times daily.   04/17/2016 at 0800  . dicyclomine (BENTYL) 10 MG capsule Take 10 mg by mouth 3 (three) times daily.    prn at prn  . ferrous sulfate 325 (65 FE) MG tablet Take 325 mg by mouth 2 (two) times daily.    04/17/2016 at 2000  . furosemide (LASIX) 80 MG tablet Take 80 mg by mouth daily.   04/17/2016 at 2000  . gabapentin (NEURONTIN) 600 MG tablet Take 600 mg by mouth 2 (two) times daily.    04/17/2016 at 2000  . insulin glargine (LANTUS) 100 UNIT/ML injection Inject 20 Units into the skin at bedtime.   04/17/2016 at 2000  . insulin lispro (HUMALOG) 100 UNIT/ML injection Inject 0-9 Units into the skin 3 (three) times daily before meals. Pt uses as needed per sliding scale:    150-200:  0 units  201-250:  5 units  251-300:  8 units 301-350:  9 units  Greater than 350:  Call MD   prn at prn  . lactulose (CHRONULAC) 10 GM/15ML solution Take 10 g by mouth daily.   04/18/2016 at 0800  . lamoTRIgine (LAMICTAL) 100 MG tablet Take 100 mg by mouth 2 (two) times daily.   04/17/2016 at 0800  . loperamide (IMODIUM) 2 MG capsule Take 4 mg by mouth as needed for diarrhea or loose stools. *Do not exceed 8 doses in 24 hours*   prn at prn  . LORazepam (ATIVAN) 0.5 MG tablet Take 0.5 mg by mouth daily as needed for anxiety.    prn at prn  . metFORMIN (GLUCOPHAGE) 500 MG tablet Take 500 mg by mouth daily. Take 2 tablets (1000 mg) by mouth every morning with meal, and take 1 tablet by mouth every evening at 5 pm with meal.   04/17/2016 at 1700  . metoCLOPramide (REGLAN) 5 MG tablet Take 5 mg by mouth 4 (four) times daily. Before meals and at bedtime   04/16/2016 at 2000  . mirtazapine (REMERON) 15 MG tablet Take 15 mg by mouth at bedtime.    04/17/2016 at 0800  . omeprazole (PRILOSEC) 40 MG capsule Take 40 mg by mouth daily before breakfast.   04/18/2016 at 0800  . oxybutynin (DITROPAN-XL) 5 MG 24 hr tablet Take 5 mg by mouth daily.   04/16/2016 at 0800  . oxyCODONE (OXY IR/ROXICODONE) 5 MG immediate release tablet Take 5 mg by mouth every 12 (twelve)  hours as needed for severe pain.    04/17/2016 at 0800  . pioglitazone (ACTOS) 30 MG tablet Take 30 mg by mouth daily.   04/17/2016 at 1300  . potassium chloride SA (K-DUR,KLOR-CON) 20 MEQ tablet Take 20 mEq by mouth daily. *Note dose*   04/18/2016 at 0800  . pregabalin (LYRICA) 150 MG capsule Take 150 mg by mouth 3 (three) times daily.    04/16/2016 at 2000  . rOPINIRole (REQUIP) 3 MG tablet Take 3 mg by mouth at bedtime.   04/17/2016 at 2000  . senna (SENOKOT) 8.6 MG tablet Take 1 tablet by mouth 3 (three) times a week. Take on Monday, Wednesday, and Friday. *Hold for loose stools*   04/17/2016 at 0800  . simvastatin (ZOCOR) 10 MG tablet Take 10 mg by mouth at bedtime.    04/17/2016 at 2000  . spironolactone (ALDACTONE) 25 MG tablet Take 25 mg by mouth daily.   04/17/2016 at 0800  . traMADol (ULTRAM) 50 MG tablet Take 50 mg by mouth every 6 (six) hours as needed for moderate pain.   04/17/2016 at 0800  . vitamin C (ASCORBIC ACID) 500 MG tablet Take 500 mg by mouth 2 (two) times daily.   04/17/2016 at 0800  . zolpidem (AMBIEN) 5 MG tablet Take 5 mg by mouth at bedtime.    04/17/2016 at 2000  . acetaminophen (TYLENOL) 325 MG tablet Take 650 mg by mouth every 6 (six) hours as needed.   prn at prn  . Multiple Vitamin (MULTIVITAMIN WITH MINERALS) TABS tablet Take 1 tablet by mouth daily with breakfast.    04/16/2016 at 0800   Assessment: CrCl = 72.5 ml/min  Pt with UTI and CAP ,  E Coli in urine Cx resistant to levaquin and ceftin but sensitive to Unasyn.  Goal of Therapy:  resolution of infection  Plan:  Expected duration 7 days with resolution of temperature and/or normalization of  WBC  Azithromycin 500 mg IV Q24H ordered to start 11/14 @ 0800. Unasyn 500 mg IV Q6H ordered to start on 11/13 @ 22:00.   Hyson,Amberia Bayless D 04/18/2016,8:39 PM

## 2016-04-18 NOTE — ED Provider Notes (Addendum)
[facility] Emergency Department Provider Note   ____________________________________________   First MD Initiated Contact with Patient 04/18/16 [phone]     (approximate)  I have reviewed the triage vital signs and the nursing notes.   HISTORY  Chief Complaint Cough and Chest Pain  History is compromised by the inability of the computer to allow me to dictate a note without deleting it and misspelling every other word HPI Stephanie [name] is a 65 y.o. female who has been sick for the last approximate 4 days. She reports she's had a wet cough. She feels short of breath. Not sure why everything is typing itself and red. In the emergency room her oxygen saturation are 88-89%. They go up to 90% on oxygen. Patient reports feeling hot at home. She has pleuritic type chest pain. The pain is moderate.  Past Medical History:  Diagnosis Date  . Adenomatous colon polyp 08/27/2014   Polyps x 3  . Anemia   . Anxiety   . 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 Arthritis   . Asthma   . Bipolar affective disorder ([place])    h/o  . Blood transfusion 2000  . Blood transfusion without reported diagnosis   . Chronic abdominal pain    abdominal wall pain  . 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 Colon polyp   . Depression   . Diabetes mellitus without complication ([place])   . Fatty liver 04/09/08   found in abd CT  . GERD (gastroesophageal reflux disease)   . H/O: CVA (cardiovascular accident)    TIA  . H/O: rheumatic fever   . Hyperlipidemia   . Incontinence   . Insomnia   . Obstructive sleep apnea    not using CPAP, last sleep study 2.5 years ago, per pt doctor is aware  . Platelets decreased ([place])   . RLS (restless legs syndrome)   . Shortness of breath   . Sleep apnea   . Stroke [facility])    TIA- 2002  . Ulcer ([place])   . Unspecified disorders of nervous system     Patient Active Problem List   Diagnosis Date Noted  . Chest pain 04/04/2016  . Iron deficiency  anemia 01/30/2016  . Hypoglycemia 09/09/2015  . Hyperglycemia 07/16/2015  . Neuropathy ([place]) 06/08/2015  . Thrombocytopenia ([place]) 08/19/2014  . Abdominal pain, chronic, epigastric 06/12/2014  . Liver cirrhosis secondary to NASH ([place]) 02/27/2014  . Dizziness and giddiness 10/30/2013  . DOE (dyspnea on exertion) 10/30/2013  . Poorly controlled type 2 diabetes mellitus with complication ([place]) 96/09/5407  . DNR (do not resuscitate) 07/29/2013  . Encounter for routine gynecological examination 07/29/2013  . Dyspnea 07/10/2013  . Lung nodule 07/10/2013  . Varicose veins of lower extremities with other complications 81/19/1478  . GERD (gastroesophageal reflux disease) 10/09/2012  . Cirrhosis of liver ([place]) 02/02/2012  . Anxiety 07/11/2011  . Aortic dissection ([place])   . BACK PAIN, LUMBAR, CHRONIC 11/19/2009  . UNS ADVRS EFF OTH RX MEDICINAL&BIOLOGICAL SBSTNC 12/16/2008  . UNSPECIFIED VITAMIN D DEFICIENCY 09/26/2008  . TOBACCO ABUSE 06/19/2008  . RESTLESS LEGS SYNDROME 04/27/2007  . INSOMNIA 04/27/2007  . HLD (hyperlipidemia) 03/08/2007  . OSA (obstructive sleep apnea) 03/08/2007  . Essential hypertension 03/08/2007  . Asthma 03/08/2007  . BIPOLAR AFFECTIVE DISORDER, HX OF 03/08/2007    Past Surgical History:  Procedure Laterality Date  . ABDOMINAL EXPLORATION SURGERY    . ABDOMINAL HYSTERECTOMY  total  . Axillary artery cannulation via 8-mm Hemashield graft, median sternotomy, extracorporeal circulation with deep hypothermic circulatory arrest, repair of  aortic dessection  01/23/2009   Dr Arlyce Dice  . BACK SURGERY     x 45  . cataract     both eyes  . CHOLECYSTECTOMY    . COLONOSCOPY    . EYE SURGERY     bilat  . HEMORROIDECTOMY     and colon polyp removed  . LIVER BIOPSY  04/10/2012   Procedure: LIVER BIOPSY;  Surgeon: Inda Castle, MD;  Location: [facility];  Service: Endoscopy;  Laterality: N/A;  ultrasound to mark order for abd limited/liver to be marked to be sent  by [email or web address]   . LUMBAR FUSION  10/09  . LUMBAR WOUND DEBRIDEMENT  05/09/2011   Procedure: LUMBAR WOUND DEBRIDEMENT;  Surgeon: Dahlia Bailiff;  Location: [facility];  Service: Orthopedics;  Laterality: N/A;  IRRIGATION AND DEBRIDEMENT SPINAL WOUND  . POSTERIOR CERVICAL FUSION/FORAMINOTOMY    . ROTATOR CUFF REPAIR     left    Prior to Admission medications   Medication Sig Start Date End Date Taking? Authorizing Provider  albuterol (VENTOLIN HFA) 108 (90 Base) MCG/ACT inhaler Inhale 2 puffs into the lungs every 6 (six) hours as needed for wheezing or shortness of breath. 12/24/15  Yes Chesley Mires, MD  ARIPiprazole (ABILIFY) 30 MG tablet Take 30 mg by mouth daily.   Yes Historical Provider, MD  budesonide-formoterol (SYMBICORT) 160-4.5 MCG/ACT inhaler Inhale 2 puffs into the lungs 2 (two) times daily.   Yes Historical Provider, MD  buPROPion (WELLBUTRIN SR) 150 MG 12 hr tablet Take 150 mg by mouth daily.   Yes Historical Provider, MD  clonazePAM (KLONOPIN) 0.5 MG tablet Take 0.5 mg by mouth 2 (two) times daily.   Yes Historical Provider, MD  dicyclomine (BENTYL) 10 MG capsule Take 10 mg by mouth 3 (three) times daily.    Yes Historical Provider, MD  ferrous sulfate 325 (65 FE) MG tablet Take 325 mg by mouth 2 (two) times daily.    Yes Historical Provider, MD  furosemide (LASIX) 80 MG tablet Take 80 mg by mouth daily.   Yes Historical Provider, MD  gabapentin (NEURONTIN) 600 MG tablet Take 600 mg by mouth 2 (two) times daily.    Yes Historical Provider, MD  insulin glargine (LANTUS) 100 UNIT/ML injection Inject 20 Units into the skin at bedtime.   Yes Historical Provider, MD  insulin lispro (HUMALOG) 100 UNIT/ML injection Inject 0-9 Units into the skin 3 (three) times daily before meals. Pt uses as needed per sliding scale:    150-200:  0 units  201-250:  5 units  251-300:  8 units 301-350:  9 units  Greater than 350:  Call MD   Yes Historical Provider, MD  lactulose (CHRONULAC) 10 GM/15ML  solution Take 10 g by mouth daily.   Yes Historical Provider, MD  lamoTRIgine (LAMICTAL) 100 MG tablet Take 100 mg by mouth 2 (two) times daily.   Yes Historical Provider, MD  loperamide (IMODIUM) 2 MG capsule Take 4 mg by mouth as needed for diarrhea or loose stools. *Do not exceed 8 doses in 24 hours*   Yes Historical Provider, MD  LORazepam (ATIVAN) 0.5 MG tablet Take 0.5 mg by mouth daily as needed for anxiety.    Yes Historical Provider, MD  metFORMIN (GLUCOPHAGE) 500 MG tablet Take 500 mg by mouth daily. Take 2 tablets (1000 mg) by mouth every morning with meal, and take  1 tablet by mouth every evening at 5 pm with meal.   Yes Historical Provider, MD  metoCLOPramide (REGLAN) 5 MG tablet Take 5 mg by mouth 4 (four) times daily. Before meals and at bedtime   Yes Historical Provider, MD  mirtazapine (REMERON) 15 MG tablet Take 15 mg by mouth at bedtime.   Yes Historical Provider, MD  omeprazole (PRILOSEC) 40 MG capsule Take 40 mg by mouth daily before breakfast.   Yes Historical Provider, MD  oxybutynin (DITROPAN-XL) 5 MG 24 hr tablet Take 5 mg by mouth daily.   Yes Historical Provider, MD  oxyCODONE (OXY IR/ROXICODONE) 5 MG immediate release tablet Take 5 mg by mouth every 12 (twelve) hours as needed for severe pain.    Yes Historical Provider, MD  pioglitazone (ACTOS) 30 MG tablet Take 30 mg by mouth daily.   Yes Historical Provider, MD  potassium chloride SA (K-DUR,KLOR-CON) 20 MEQ tablet Take 20 mEq by mouth daily. *Note dose*   Yes Historical Provider, MD  pregabalin (LYRICA) 150 MG capsule Take 150 mg by mouth 3 (three) times daily.    Yes Historical Provider, MD  rOPINIRole (REQUIP) 3 MG tablet Take 3 mg by mouth at bedtime.   Yes Historical Provider, MD  senna (SENOKOT) 8.6 MG tablet Take 1 tablet by mouth 3 (three) times a week. Take on Monday, Wednesday, and Friday. *Hold for loose stools*   Yes Historical Provider, MD  simvastatin (ZOCOR) 10 MG tablet Take 10 mg by mouth at bedtime.     Yes Historical Provider, MD  spironolactone (ALDACTONE) 25 MG tablet Take 25 mg by mouth daily.   Yes Historical Provider, MD  traMADol (ULTRAM) 50 MG tablet Take 50 mg by mouth every 6 (six) hours as needed for moderate pain.   Yes Historical Provider, MD  vitamin C (ASCORBIC ACID) 500 MG tablet Take 500 mg by mouth 2 (two) times daily.   Yes Historical Provider, MD  zolpidem (AMBIEN) 5 MG tablet Take 5 mg by mouth at bedtime.    Yes Historical Provider, MD  acetaminophen (TYLENOL) 325 MG tablet Take 650 mg by mouth every 6 (six) hours as needed.    Historical Provider, MD  Multiple Vitamin (MULTIVITAMIN WITH MINERALS) TABS tablet Take 1 tablet by mouth daily with breakfast.     Historical Provider, MD    Allergies Doxycycline  Family History  Problem Relation Age of Onset  . Breast cancer Mother   . Lung cancer Mother   . Stomach cancer Father   . Diabetes      4 aunts, and 1 uncle  . Colon cancer Neg Hx   . Esophageal cancer Neg Hx   . Rectal cancer Neg Hx     Social History Social History  Substance Use Topics  . Smoking status: Former Smoker    Packs/day: 1.50    Years: 50.00    Types: Cigarettes    Quit date: 04/06/2013  . Smokeless tobacco: Never Used  . Alcohol use No    Review of Systems Constitutional:  Fever no chills Eyes: No visual changes. ENT: No sore throat. Cardiovascular: See history of present illness Respiratory: See history of present illness Gastrointestinal: No abdominal pain.  No nausea, no vomiting.  No diarrhea.  No constipation. Genitourinary: Negative for dysuria. Musculoskeletal: Negative for back pain. Skin: Negative for rash. 10-point ROS otherwise negative.  ____________________________________________   PHYSICAL EXAM:  VITAL SIGNS: ED Triage Vitals  Enc Vitals Group     BP 04/18/16 0845  118/73     Pulse Rate 04/18/16 0850 (!) 105     Resp 04/18/16 0850 18     Temp 04/18/16 0850 99.6 F (37.6 C)     Temp Source 04/18/16 0850  Oral     SpO2 04/18/16 0847 95 %     Weight 04/18/16 0850 224 lb (101.6 kg)     Height 04/18/16 0850 5' (1.524 m)     Head Circumference --      Peak Flow --      Pain Score --      Pain Loc --      Pain Edu? --      Excl. in [place]? --     Constitutional: Alert and oriented. Well appearing and in no acute distress. Eyes: Conjunctivae are normal. PERRL. EOMI. Head: Atraumatic. Nose: No congestion/rhinnorhea. Mouth/Throat: Mucous membranes are moist.  Oropharynx non-erythematous. Neck: No stridor. Cardiovascular: Normal rate, regular rhythm. Grossly normal heart sounds.  Good peripheral circulation. Respiratory: Normal respiratory effort.  No retractions. Lungs Crackles on the right side. Gastrointestinal: Soft and nontender. No distention. No abdominal bruits. No CVA tenderness. Musculoskeletal: Patient has trace edema bilaterally in the lower legs. There is some erythema on the right leg. This erythema is below the knee and on the right around the ankle. Patient has good distal pulses bilaterally. Patient reports her toes are numb. The numbness has been present for quite some time.   ____________________________________________   LABS (all labs ordered are listed, but only abnormal results are displayed)  Labs Reviewed  COMPREHENSIVE METABOLIC PANEL - Abnormal; Notable for the following:       Result Value   Glucose, Bld 151 (*)    Albumin 3.3 (*)    Alkaline Phosphatase 130 (*)    Total Bilirubin 1.3 (*)    All other components within normal limits  BRAIN NATRIURETIC PEPTIDE - Abnormal; Notable for the following:    B Natriuretic Peptide 143.0 (*)    All other components within normal limits  CBC WITH DIFFERENTIAL/PLATELET - Abnormal; Notable for the following:    RDW 16.4 (*)    Platelets 113 (*)    Neutro Abs 8.5 (*)    Lymphs Abs 0.7 (*)    All other components within normal limits  URINALYSIS COMPLETEWITH MICROSCOPIC ([facility] ONLY) - Abnormal; Notable for the following:     Color, Urine YELLOW (*)    APPearance CLEAR (*)    Nitrite POSITIVE (*)    Leukocytes, UA 3+ (*)    Bacteria, UA RARE (*)    Squamous Epithelial / LPF 0-5 (*)    All other components within normal limits  CULTURE, BLOOD (ROUTINE X 2)  CULTURE, BLOOD (ROUTINE X 2)  LACTIC ACID, PLASMA  TROPONIN I   ____________________________________________  EKG  EKG read and interpreted by me shows normal sinus rhythm rate of 92 normal axis no acute ST-T wave changes are seen. ____________________________________________  RADIOLOGY  Chest x-ray on my reading appears to have a right lower lobe infiltrate. ____________________________________________   PROCEDURES  Procedure(s) performed:   Procedures  Critical Care performed:   ____________________________________________   INITIAL IMPRESSION / ASSESSMENT AND PLAN / ED COURSE  Pertinent labs & imaging results that were available during my care of the patient were reviewed by me and considered in my medical decision making (see chart for details).   Clinical Course      ____________________________________________   FINAL CLINICAL IMPRESSION(S) / ED DIAGNOSES  Final diagnoses:  Hypoxia  Healthcare-associated pneumonia      NEW MEDICATIONS STARTED DURING THIS VISIT:  New Prescriptions   No medications on file     Note:  This document was prepared using Dragon voice recognition software and may include unintentional dictation errors.    Nena Polio, MD 04/18/16 1141    Nena Polio, MD 04/18/16 [phone]

## 2016-04-18 NOTE — ED Triage Notes (Signed)
Pt in via EMS with c/o cough, and chest pain when coughing. EMS reports BP 116/61, HR 100, RR18, 95%RA  Pt reports non productive cough for 4 days. Pt reports chest only hurts when she coughs but she can't cough anything up. Pt reports was supposed to go to the MD but didn't get an appointment. Pt from [facility].

## 2016-04-18 NOTE — ED Notes (Signed)
Admitting MD at bedside.

## 2016-04-18 NOTE — Progress Notes (Addendum)
Pharmacy Antibiotic Note  Stephanie [name] is a 65 y.o. female admitted on 04/18/2016 with pneumonia and Cellulitis.  Pharmacy has been consulted for levofloxacin and vancomycin dosing. Patient received Zosyn 3.376m x 1 in ED.  Plan: DW: 70kg   Ke: 0.064   T1/2: 10.8   Vd: 49  Will start the patient on Vancomycin 1g IV every 12 hours with 6 hours stack dosing. Estimated trough at Css 16. Trough ordered prior to 5th dose. MRSA PCR pending.   Will start patient on Levofloxacin 7568mevery 24 hours.  Will continue to monitor renal function and adjust abx as needed.    Height: 5' (152.4 cm) Weight: 224 lb (101.6 kg) IBW/kg (Calculated) : 45.5  Temp (24hrs), Avg:99.6 F (37.6 C), Min:99.6 F (37.6 C), Max:99.6 F (37.6 C)   Recent Labs Lab 04/18/16 0906 04/18/16 0943  WBC 10.2  --   CREATININE 0.83  --   LATICACIDVEN  --  1.6    Estimated Creatinine Clearance: 72.4 mL/min (by C-G formula based on SCr of 0.83 mg/dL).    Allergies  Allergen Reactions  . Doxycycline Hives, Swelling and Other (See Comments)    Reaction:  Facial swelling     Antimicrobials this admission: 11/13 Zosyn >> 11/13 11/13 Levofloxacin >> 11/13 Vancomycin >>  Dose adjustments this admission:   Microbiology results: 11/13 BCx:  11/13 MRSA PCR:   Thank you for allowing pharmacy to be a part of this patient's care.  ShPernell DuprePharmD Clinical Pharmacist  04/18/2016 1:56 PM

## 2016-04-19 ENCOUNTER — Encounter (INDEPENDENT_AMBULATORY_CARE_PROVIDER_SITE_OTHER): Payer: Medicare Other | Admitting: Vascular Surgery

## 2016-04-19 LAB — BASIC METABOLIC PANEL
ANION GAP: 7 (ref 5–15)
BUN: 13 mg/dL (ref 6–20)
CHLORIDE: 105 mmol/L (ref 101–111)
CO2: 31 mmol/L (ref 22–32)
Calcium: 8.3 mg/dL — ABNORMAL LOW (ref 8.9–10.3)
Creatinine, Ser: 0.86 mg/dL (ref 0.44–1.00)
GFR calc non Af Amer: >60 mL/min (ref >60)
Glucose, Bld: 95 mg/dL (ref 65–99)
Potassium: 3.4 mmol/L — ABNORMAL LOW (ref 3.5–5.1)
Sodium: 143 mmol/L (ref 135–145)

## 2016-04-19 LAB — CBC
HEMATOCRIT: 33 % — AB (ref 35.0–47.0)
HEMOGLOBIN: 11 g/dL — AB (ref 12.0–16.0)
MCH: 31.4 pg (ref 26.0–34.0)
MCHC: 33.4 g/dL (ref 32.0–36.0)
MCV: 93.9 fL (ref 80.0–100.0)
Platelets: 98 10*3/uL — ABNORMAL LOW (ref 150–440)
RBC: 3.51 MIL/uL — AB (ref 3.80–5.20)
RDW: 16.2 % — ABNORMAL HIGH (ref 11.5–14.5)
WBC: 5.9 10*3/uL (ref 3.6–11.0)

## 2016-04-19 LAB — HEMOGLOBIN A1C
HEMOGLOBIN A1C: 6.2 % — AB (ref 4.8–5.6)
Mean Plasma Glucose: 131 mg/dL

## 2016-04-19 LAB — GLUCOSE, CAPILLARY
GLUCOSE-CAPILLARY: 162 mg/dL — AB (ref 65–99)
Glucose-Capillary: 90 mg/dL (ref 65–99)
Glucose-Capillary: 141 mg/dL — ABNORMAL HIGH (ref 65–99)
Glucose-Capillary: 153 mg/dL — ABNORMAL HIGH (ref 65–99)

## 2016-04-19 MED ORDER — POTASSIUM CHLORIDE 20 MEQ/15ML (10%) PO SOLN
40.0000 meq | Freq: Once | ORAL | Status: AC
  Administered 2016-04-19: 09:00:00 40 meq via ORAL
  Filled 2016-04-19: qty 30

## 2016-04-19 MED ORDER — SODIUM CHLORIDE 0.9 % IV SOLN
1.0000 g | Freq: Three times a day (TID) | INTRAVENOUS | Status: DC
  Administered 2016-04-19 – 2016-04-20 (×4): 1 g via INTRAVENOUS
  Filled 2016-04-19 (×6): qty 1

## 2016-04-19 NOTE — Progress Notes (Signed)
Pharmacy Antibiotic Note  Stephanie [name] is a 65 y.o. female admitted on 04/18/2016 with pneumonia and Cellulitis.  Pharmacy has been consulted for levofloxacin and vancomycin dosing. Patient received Zosyn 3.334m x 1 in ED.  Plan: DW: 70kg   Ke: 0.064   T1/2: 10.8   Vd: 49  Will start the patient on Vancomycin 1g IV every 12 hours with 6 hours stack dosing. Estimated trough at Css 16. Trough ordered prior to 5th dose. MRSA PCR pending.   Will start patient on Levofloxacin 759mevery 24 hours.  Will continue to monitor renal function and adjust abx as needed.    Height: 5' (152.4 cm) Weight: 224 lb 3.2 oz (101.7 kg) IBW/kg (Calculated) : 45.5  Temp (24hrs), Avg:98.5 F (36.9 C), Min:97.5 F (36.4 C), Max:99.6 F (37.6 C)   Recent Labs Lab 04/18/16 0906 04/18/16 0943  WBC 10.2  --   CREATININE 0.83  --   LATICACIDVEN  --  1.6    Estimated Creatinine Clearance: 72.5 mL/min (by C-G formula based on SCr of 0.83 mg/dL).    Allergies  Allergen Reactions  . Doxycycline Hives, Swelling and Other (See Comments)    Reaction:  Facial swelling     Antimicrobials this admission: 11/13 Zosyn >> 11/13 11/13 Levofloxacin >> 11/13 Vancomycin >>  Dose adjustments this admission: Patient on vancomycin, Levaquin, Unasyn and azithromycin. Spoke with hospitalist regarding streamlining of antibiotic therapy. Per conversation will continue with vancomycin and meropenem for pneumonia, cellulitis, and probable UTI with hx ESBL E. coli.   Microbiology results: 11/13 BCx:  11/13 MRSA PCR:   Thank you for allowing pharmacy to be a part of this patient's care.  McEloise HarmanPharmD Clinical Pharmacist  04/19/2016 5:56 AM

## 2016-04-19 NOTE — Care Management Important Message (Signed)
Important Message  Patient Details  Name: Stephanie [name] MRN: [id number] Date of Birth: June 22, 1950   Medicare Important Message Given:  Yes    Shelbie Ammons, RN 04/19/2016, 8:07 AM

## 2016-04-19 NOTE — Clinical Social Work Note (Addendum)
Clinical Social Work Assessment  Patient Details  Name: Stephanie [name] MRN: [id number] Date of Birth: 1951/05/22  Date of referral:  04/19/16               Reason for consult:  Discharge Planning (Admitted from the [place], [place])                Permission sought to share information with:  Case Manager, Facility Sport and exercise psychologist, Family Supports Permission granted to share information::  Yes, Verbal Permission Granted  Name::     friend: Jones Broom [phone]  Daughter In law  Agency::  [facility]  Relationship::     Contact Information:     Housing/Transportation Living arrangements for the past 2 months:  [place] of Information:  Patient, Medical Team, Case Manager Patient Interpreter Needed:  None Criminal Activity/Legal Involvement Pertinent to Current Situation/Hospitalization:  No - Comment as needed Significant Relationships:  Other Family Members, Friend Lives with:  Facility Resident Do you feel safe going back to the place where you live?  Yes Need for family participation in patient care:  No (Coment)  Care giving concerns:  No concerns noted by patient. Last assessment patient was admitted from [facility] [place] and she remains a resident at facility. Will plan to return at DC. No change in care, or ADLs. Came to hospital due to being sick for 4 days with a bad cough. No recent falls noted, reports she is independent at baseline. Spoke with facility and notified of patient's admission and current status. No barriers for return at this time. Aware of admission and appreciative.     Social Worker assessment / plan:  LCSW completed assessment and reviewed chart. Recent admission in October with patient returning to [facility]. She hopes to return to same level of care. Will update FL2 once patient medically stable for discharge. Plan: Return to ALF  Employment status:  Retired Nurse, adult PT Recommendations:  Not  assessed at this time Information / Referral to community resources:     Patient/Family's Response to care:  Agreeable to plan  Patient/Family's Understanding of and Emotional Response to Diagnosis, Current Treatment, and Prognosis:  Patient understands reason for admission and treatment for current cough and sickness.   Emotional Assessment Appearance:  Appears stated age Attitude/Demeanor/Rapport:  Other (cooperative) Affect (typically observed):  Accepting, Adaptable Orientation:  Oriented to Self, Oriented to Place, Oriented to  Time, Oriented to Situation Alcohol / Substance use:  Not Applicable Psych involvement (Current and /or in the community):  No (Comment)  Discharge Needs  Concerns to be addressed:  No discharge needs identified Readmission within the last 30 days:  Yes Current discharge risk:  None Barriers to Discharge:  No Barriers Identified, Continued Medical Work up   Lilly [place], LCSW 04/19/2016, 11:41 AM

## 2016-04-19 NOTE — Care Management (Signed)
Admitted to [facility] with the diagnosis of pneumonia. Discharged from this facility 04/05/16.         A resident of [facility] since 10/01/15. Prior to living at [facility] lived alone in her home. Ex-daughter-in-law is Tammie [phone]). Seen Dr. Perrin Smack at [facility] last Thursday. [facility] in the past, No skilled facility. No home oxygen. Uses a rolling walker to aid in ambulation. Needs help with baths, self dress and feeds. Prescription medications are given at the facility. Last fall was 4 weeks ago, Decreased appetite for a while. No weight loss. Ina Kick will transport. Shelbie Ammons RN MSN CCM Care Management [phone]

## 2016-04-19 NOTE — Progress Notes (Addendum)
[facility] [place] NAME: Stephanie [name]    MR#:  [id number]  DATE OF BIRTH:  09/06/1950  SUBJECTIVE:  Patient presented with cough and fevers with nasal congestion. She reports that she feels subjectively better since admission.  REVIEW OF SYSTEMS:    Review of Systems  Constitutional: Negative for chills, fever and malaise/fatigue.  HENT: Negative.  Negative for ear discharge, ear pain, hearing loss, nosebleeds and sore throat.   Eyes: Negative.  Negative for blurred vision and pain.  Respiratory: Positive for cough. Negative for hemoptysis, shortness of breath and wheezing.   Cardiovascular: Negative.  Negative for chest pain, palpitations and leg swelling.  Gastrointestinal: Negative.  Negative for abdominal pain, blood in stool, diarrhea, nausea and vomiting.  Genitourinary: Positive for frequency. Negative for dysuria.  Musculoskeletal: Negative.  Negative for back pain.  Skin: Negative.   Neurological: Positive for weakness. Negative for dizziness, tremors, speech change, focal weakness, seizures and headaches.  Endo/Heme/Allergies: Negative.  Does not bruise/bleed easily.  Psychiatric/Behavioral: Negative.  Negative for depression, hallucinations and suicidal ideas.    Tolerating Diet: yes      DRUG ALLERGIES:   Allergies  Allergen Reactions  . Doxycycline Hives, Swelling and Other (See Comments)    Reaction:  Facial swelling     VITALS:  Blood pressure (!) 100/49, pulse 75, temperature 97.5 F (36.4 C), temperature source Oral, resp. rate 16, height 5' (1.524 m), weight 101.7 kg (224 lb 3.2 oz), SpO2 100 %.  PHYSICAL EXAMINATION:   Physical Exam  Constitutional: She is oriented to person, place, and time and well-developed, well-nourished, and in no distress. No distress.  HENT:  Head: Normocephalic.  Eyes: No scleral icterus.  Neck: Normal range of motion. Neck supple. No JVD present. No tracheal deviation  present.  Cardiovascular: Normal rate, regular rhythm and normal heart sounds.  Exam reveals no gallop and no friction rub.   No murmur heard. Pulmonary/Chest: Effort normal. No respiratory distress. She has no wheezes. She has rales (RLL). She exhibits no tenderness.  Abdominal: Soft. Bowel sounds are normal. She exhibits no distension and no mass. There is no tenderness. There is no rebound and no guarding.  Musculoskeletal: Normal range of motion. She exhibits edema.  Neurological: She is alert and oriented to person, place, and time.  Skin: Skin is warm. No rash noted. There is erythema (LE cellulitis).  Psychiatric: Affect and judgment normal.      LABORATORY PANEL:   CBC  Recent Labs Lab 04/19/16 0543  WBC 5.9  HGB 11.0*  HCT 33.0*  PLT 98*   ------------------------------------------------------------------------------------------------------------------  Chemistries   Recent Labs Lab 04/18/16 0906 04/19/16 0543  NA 141 143  K 3.7 3.4*  CL 101 105  CO2 32 31  GLUCOSE 151* 95  BUN 12 13  CREATININE 0.83 0.86  CALCIUM 9.3 8.3*  AST 32  --   ALT 20  --   ALKPHOS 130*  --   BILITOT 1.3*  --    ------------------------------------------------------------------------------------------------------------------  Cardiac Enzymes  Recent Labs Lab 04/18/16 0906  TROPONINI <0.03   ------------------------------------------------------------------------------------------------------------------  RADIOLOGY:  Dg Chest 2 View  Result Date: 04/18/2016 CLINICAL DATA:  65 year old female with nonproductive cough for 4 days. Chest pain. Fever and hypoxia. Initial encounter. EXAM: CHEST  2 VIEW COMPARISON:  Chest radiographs and chest CT 04/04/2016 and earlier. FINDINGS: Large body habitus. Cervical ACDF hardware. Lower thoracic spinal stimulator device again noted. Stable lung volumes. On the frontal view  there is decreased visualization of the right hemidiaphragm, but  this is not correlated on the lateral view. No pneumothorax. Stable pulmonary vascularity without overt edema. No pleural effusion. Stable cardiac size and mediastinal contours. No acute osseous abnormality identified. IMPRESSION: 1. Decreased ventilation at the anterior right lung base since 04/04/2016. This might represent atelectasis but is suspicious for bronchopneumonia in this clinical setting. No pleural effusion. 2. Otherwise stable chest. Electronically Signed   By: Genevie Ann M.D.   On: 04/18/2016 11:37   US Venous Img Lower Unilateral Right  Result Date: 04/18/2016 CLINICAL DATA:  Right leg swelling for 4 months. EXAM: RIGHT LOWER EXTREMITY VENOUS DOPPLER ULTRASOUND TECHNIQUE: Gray-scale sonography with graded compression, as well as color Doppler and duplex ultrasound were performed to evaluate the lower extremity deep venous systems from the level of the common femoral vein and including the common femoral, femoral, profunda femoral, popliteal and calf veins including the posterior tibial, peroneal and gastrocnemius veins when visible. The superficial great saphenous vein was also interrogated. Spectral Doppler was utilized to evaluate flow at rest and with distal augmentation maneuvers in the common femoral, femoral and popliteal veins. COMPARISON:  None. FINDINGS: Contralateral Common Femoral Vein: Respiratory phasicity is normal and symmetric with the symptomatic side. No evidence of thrombus. Normal compressibility. Common Femoral Vein: No evidence of thrombus. Normal compressibility, respiratory phasicity and response to augmentation. Saphenofemoral Junction: No evidence of thrombus. Normal compressibility and flow on color Doppler imaging. Profunda Femoral Vein: No evidence of thrombus. Normal compressibility and flow on color Doppler imaging. Femoral Vein: No evidence of thrombus. Normal compressibility, respiratory phasicity and response to augmentation. Popliteal Vein: No evidence of  thrombus. Normal compressibility, respiratory phasicity and response to augmentation. Calf Veins: No evidence of thrombus. Normal compressibility and flow on color Doppler imaging. Superficial Great Saphenous Vein: No evidence of thrombus. Normal compressibility and flow on color Doppler imaging. Venous Reflux:  None. Other Findings:  None. IMPRESSION: No evidence of deep venous thrombosis. Electronically Signed   By: Jerilynn Mages.  Shick M.D.   On: 04/18/2016 16:39     ASSESSMENT AND PLAN:   65 year old female with a history of hypertension, insulin-dependent diabetes, liver cirrhosis due to [facility] with recent hospitalization for urinary tract infection who presents due to acute hypoxic respiratory failure in the setting of pneumonia.  1. Acute hypoxic respiratory failure in the setting of pneumonia:  Continue meropenem and vancomycin Follow up on MRSA PCR 2. ESBL UTI from previous hospitalization: Patient was discharged on Ceftin however this was resistant and UA on admission shows urinary tract infection. She is now on meropenem.  3. Right lower extremity cellulitis: Dopplers are negative for DVT. Continue meropenem and vancomycin.  4. Insulin-dependent diabetes: Continue Lantus, sliding scale, metformin ADA diet DM consult  5. COPD without exacerbation  6. Depression and anxiety: Continue Remeron, Ativan, Klonopin, Wellbutrin and Abilify  7. Peripheral neuropathy: Continue Lyrica and gabapentin  8. 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 Obstructive sleep apnea: Patient is encouraged to use her CPAP.  10. Hypokalemia: Replete and recheck in a.m.  Management plans discussed with the patient and she is in agreement.  CODE STATUS: full  TOTAL TIME TAKING CARE OF THIS PATIENT: 30 minutes.    PT eval  POSSIBLE D/C 1-2 days to AL, DEPENDING ON CLINICAL CONDITION.   Keinan Brouillet M.D on 04/19/2016 at 9:34  AM  Between 7am to 6pm - Pager - [phone]  After 6pm go to [email or web address] - password [email or web address] [facility]  Office  [phone]  CC: Primary care physician; Sarajane Jews, MD  Note: This dictation was prepared with Dragon dictation along with smaller phrase technology. Any transcriptional errors that result from this process are unintentional.

## 2016-04-19 NOTE — Progress Notes (Signed)
Inpatient Diabetes Program Recommendations  AACE/ADA: New Consensus Statement on Inpatient Glycemic Control (2015)  Target Ranges:  Prepandial:   less than 140 mg/dL      Peak postprandial:   less than 180 mg/dL (1-2 hours)      Critically ill patients:  140 - 180 mg/dL   Results for CAMBREY, LUPI (MRN [id number]) as of 04/19/2016 12:17  Ref. Range 04/18/2016 14:46 04/18/2016 16:46 04/18/2016 21:18 04/19/2016 07:42 04/19/2016 11:32  Glucose-Capillary Latest Ref Range: 65 - 99 mg/dL 106 (H) 167 (H) 122 (H) 90 162 (H)    Admit with: Pneumonia  History: DM, Cirrhosis  Home DM Meds: Lantus 20 units QHS       Humalog 0-9 units TID per SSI       Metformin 1000 mg AM/ 500 mg PM       Actos 30 mg daily  Current Insulin Orders: Lantus 20 units QHS      Novolog Sensitive Correction Scale/ SSI (0-9 units) TID AC + HS      Metformin 1000 mg AM/ 500 mg PM      CBGs well controlled so far on current regimen.  Agree with current orders.     --Will follow patient during hospitalization--  Wyn Quaker RN, MSN, CDE Diabetes Coordinator Inpatient Glycemic Control Team Team Pager: [phone] (8a-5p)

## 2016-04-19 NOTE — Evaluation (Signed)
Physical Therapy Evaluation Patient Details Name: Stephanie [name] MRN: [id number] DOB: 1951/05/27 Today's Date: 04/19/2016   History of Present Illness  presented to ER secondary to cough, fever and chest pain; admitted with R LL CAP.  Clinical Impression  Upon evaluation, patient alert and oriented to self, location only; follows simple, one-step comands, but demonstrates noted deficits in problem-solving/safety awareness/STM.  Bilat UE/LE globally weak and deconditioned throughout.  R lateral lean noted with unsupported sitting, requiring constant cuing/assist from therapist for correction. Requires mod assist for bed mobility; min/mod assist with RW for sit/stand, basic transfers and very short-distance gait (5').  Generally unsteady and staggering; noted difficulty with weight shifting and limb advancement (R > L).  Unable to tolerate increased distances due to fatigue; unable to demonstrate ability to safely complete household distances. Would benefit from skilled PT to address above deficits and promote optimal return to PLOF; recommend transition to STR upon discharge from acute hospitalization.      Follow Up Recommendations SNF    Equipment Recommendations       Recommendations for Other Services       Precautions / Restrictions Precautions Precautions: Fall Precaution Comments: contact isolation Restrictions Weight Bearing Restrictions: No      Mobility  Bed Mobility Overal bed mobility: Needs Assistance Bed Mobility: Supine to Sit     Supine to sit: Mod assist     General bed mobility comments: assist for truncal elevation and accommodation to midline/upright.  R lateral lean with initial transition to upright, requiring min assist for correction   Transfers Overall transfer level: Needs assistance Equipment used: Rolling walker (2 wheeled) Transfers: Sit to/from Stand Sit to Stand: Min assist;Mod assist         General transfer comment: cuing for hand  placement to prevent pulling on RW; broad BOS with multiple attempts, use of momentum to complete movement transition  Ambulation/Gait Ambulation/Gait assistance: Min assist;Mod assist Ambulation Distance (Feet): 5 Feet (x2) Assistive device: Rolling walker (2 wheeled)     Gait velocity interpretation: <1.8 ft/sec, indicative of risk for recurrent falls General Gait Details: broad BOS with short, staggering steps; poor balance and stability.  Intermittent difficulty with weight shifting and limb advancement (R > L).  Unable to tolerate additional distance due to fatigue  Stairs            Wheelchair Mobility    Modified Rankin (Stroke Patients Only)       Balance Overall balance assessment: Needs assistance Sitting-balance support: No upper extremity supported;Feet supported Sitting balance-Leahy Scale: Fair     Standing balance support: Bilateral upper extremity supported Standing balance-Leahy Scale: Poor                               Pertinent Vitals/Pain Pain Assessment: No/denies pain    Home Living Family/patient expects to be discharged to:: Assisted living (resident of [facility] since 09/2015)               Home Equipment: Walker - standard;Cane - single point      Prior Function Level of Independence: Needs assistance         Comments: Patient reports sup/mod indep with ADLs and mobility within apartment/facility (to/from dining hall); chart indicates patient receives assist for ADLs and self-care tasks.  Previous records indicate history of multiple falls.     Hand Dominance        Extremity/Trunk Assessment   Upper  Extremity Assessment: Generalized weakness           Lower Extremity Assessment: Generalized weakness (grossly at least 3+/5; unable to test R LE with resistance secondary to chronic R knee pain)         Communication      Cognition Arousal/Alertness: Awake/alert Behavior During Therapy: WFL for tasks  assessed/performed Overall Cognitive Status: Impaired/Different from baseline (oriented to self, location only; generally confused to date with noted deficits with STM (even within session))                      General Comments      Exercises Other Exercises Other Exercises: Attempted seated LE therex; patient unwilling to participate, stating isolated therex bothered her R knee   Assessment/Plan    PT Assessment Patient needs continued PT services  PT Problem List Decreased strength;Decreased range of motion;Decreased activity tolerance;Decreased balance;Decreased mobility;Decreased knowledge of use of DME;Decreased safety awareness;Decreased cognition;Decreased knowledge of precautions;Obesity          PT Treatment Interventions DME instruction;Gait training;Stair training;Functional mobility training;Therapeutic activities;Therapeutic exercise;Cognitive remediation;Patient/family education;Balance training    PT Goals (Current goals can be found in the Care Plan section)  Acute Rehab PT Goals Patient Stated Goal: to get up to chair PT Goal Formulation: With patient Time For Goal Achievement: 05/03/16 Potential to Achieve Goals: Fair    Frequency Min 2X/week   Barriers to discharge Decreased caregiver support      Co-evaluation               End of Session Equipment Utilized During Treatment: Gait belt Activity Tolerance: Patient limited by fatigue Patient left: in chair;with call bell/phone within reach;with chair alarm set Nurse Communication: Mobility status         Time: 4403-4742 PT Time Calculation (min) (ACUTE ONLY): 21 min   Charges:   PT Evaluation $PT Eval Moderate Complexity: 1 Procedure PT Treatments $Therapeutic Activity: 8-22 mins   PT G Codes:        ,Segundo Makela H. Owens Shark, PT, DPT, NCS 04/19/16, 10:33 PM [phone]

## 2016-04-20 LAB — BASIC METABOLIC PANEL
Anion gap: 4 — ABNORMAL LOW (ref 5–15)
BUN: 11 mg/dL (ref 6–20)
CHLORIDE: 110 mmol/L (ref 101–111)
CO2: 28 mmol/L (ref 22–32)
Calcium: 8.5 mg/dL — ABNORMAL LOW (ref 8.9–10.3)
Creatinine, Ser: 0.67 mg/dL (ref 0.44–1.00)
GFR calc Af Amer: >60 mL/min (ref >60)
GLUCOSE: 73 mg/dL (ref 65–99)
Potassium: 4.2 mmol/L (ref 3.5–5.1)
Sodium: 142 mmol/L (ref 135–145)

## 2016-04-20 LAB — CBC
HEMATOCRIT: 33.4 % — AB (ref 35.0–47.0)
HEMOGLOBIN: 11.2 g/dL — AB (ref 12.0–16.0)
MCH: 31.7 pg (ref 26.0–34.0)
MCHC: 33.6 g/dL (ref 32.0–36.0)
MCV: 94.5 fL (ref 80.0–100.0)
Platelets: 105 10*3/uL — ABNORMAL LOW (ref 150–440)
RBC: 3.53 MIL/uL — ABNORMAL LOW (ref 3.80–5.20)
RDW: 15.8 % — AB (ref 11.5–14.5)
WBC: 4.8 10*3/uL (ref 3.6–11.0)

## 2016-04-20 LAB — GLUCOSE, CAPILLARY
GLUCOSE-CAPILLARY: 81 mg/dL (ref 65–99)
GLUCOSE-CAPILLARY: 115 mg/dL — AB (ref 65–99)
Glucose-Capillary: 126 mg/dL — ABNORMAL HIGH (ref 65–99)
Glucose-Capillary: 92 mg/dL (ref 65–99)
Glucose-Capillary: 163 mg/dL — ABNORMAL HIGH (ref 65–99)

## 2016-04-20 LAB — SEDIMENTATION RATE: Sed Rate: 71 mm/hr — ABNORMAL HIGH (ref 0–30)

## 2016-04-20 MED ORDER — MEROPENEM-SODIUM CHLORIDE 1 GM/50ML IV SOLR
1.0000 g | Freq: Three times a day (TID) | INTRAVENOUS | Status: DC
  Administered 2016-04-20 – 2016-04-21 (×3): 1 g via INTRAVENOUS
  Filled 2016-04-20 (×6): qty 50

## 2016-04-20 NOTE — Progress Notes (Signed)
Made Dr. Benjie Karvonen aware of pt's current BP.  No new orders and ok to give pt's medications.    04/20/16 0835  Vitals  Temp 98.3 F (36.8 C)  Temp Source Oral  BP (!) 95/42  MAP (mmHg) (!) 54  BP Location Left Arm  BP Method Automatic  Patient Position (if appropriate) Lying  Pulse Rate 72  Pulse Rate Source Monitor  Resp 13  Oxygen Therapy  SpO2 99 %  O2 Device Nasal Cannula  O2 Flow Rate (L/min) 2 L/min  Pain Assessment  Pain Assessment 0-10  Pain Score 5  Pain Location Chest  Pain Descriptors / Indicators Cr[place]

## 2016-04-20 NOTE — Progress Notes (Signed)
LCSW received a new consult for higher level of care: SNF. LCSW met with patient at the bedside, explained role and reason for consult. Explained that PT is recommending patient go into SNF for short term rehab due to her inability to ambulate and be independent at ALF.  Patient reports she can walk, she does not want SNF and she wants to return to [facility] at discharge and work with physical therapy at ALF as she has been a long term resident for the last 2 years and does not want to leave the ALF.    LCSW will discuss with PT and see if they can work with patient again to see if there is improvement and more motivation for return and understand baseline.  Will continue to follow and assist with disposition.    LCSW, MSW Clinical Social Work: System Wide Float Coverage for :  [phone] 

## 2016-04-20 NOTE — Progress Notes (Signed)
[facility] NAME: Stephanie [name]    MR#:  [id number]  DATE OF BIRTH:  [birth year]  SUBJECTIVE:  Patient with Headache this morning. No vision or neck stiffness. Breathing has much improved. Blood pressure was low. She denies feeling dizzy or lightheadedness.  REVIEW OF SYSTEMS:    Review of Systems  Constitutional: Negative for chills, fever and malaise/fatigue.  HENT: Negative.  Negative for ear discharge, ear pain, hearing loss, nosebleeds and sore throat.   Eyes: Negative.  Negative for blurred vision and pain.  Respiratory: Positive for cough. Negative for hemoptysis, shortness of breath and wheezing.   Cardiovascular: Negative.  Negative for chest pain, palpitations and leg swelling.  Gastrointestinal: Negative.  Negative for abdominal pain, blood in stool, diarrhea, nausea and vomiting.  Genitourinary: Positive for frequency. Negative for dysuria.  Musculoskeletal: Negative.  Negative for back pain.  Skin: Negative.   Neurological: Positive for weakness and headaches. Negative for dizziness, tremors, speech change, focal weakness and seizures.  Endo/Heme/Allergies: Negative.  Does not bruise/bleed easily.  Psychiatric/Behavioral: Negative.  Negative for depression, hallucinations and suicidal ideas.    Tolerating Diet: yes      DRUG ALLERGIES:   Allergies  Allergen Reactions  . Doxycycline Hives, Swelling and Other (See Comments)    Reaction:  Facial swelling     VITALS:  Blood pressure (!) 95/42, pulse 72, temperature 98.3 F (36.8 C), temperature source Oral, resp. rate 13, height 5' (1.524 m), weight 101.7 kg (224 lb 3.2 oz), SpO2 99 %.  PHYSICAL EXAMINATION:   Physical Exam  Constitutional: She is oriented to person, place, and time and well-developed, well-nourished, and in no distress. No distress.  HENT:  Head: Normocephalic.  Eyes: No scleral icterus.  Neck: Normal range of motion. Neck supple. No JVD  present. No tracheal deviation present.  Cardiovascular: Normal rate, regular rhythm and normal heart sounds.  Exam reveals no gallop and no friction rub.   No murmur heard. Pulmonary/Chest: Effort normal. No respiratory distress. She has no wheezes. She has no rales. She exhibits no tenderness.  Abdominal: Soft. Bowel sounds are normal. She exhibits no distension and no mass. There is no tenderness. There is no rebound and no guarding.  Musculoskeletal: Normal range of motion. She exhibits no edema.  Neurological: She is alert and oriented to person, place, and time.  Skin: Skin is warm. No rash noted. Erythema: Extremity cellulitis resolved.  Psychiatric: Affect and judgment normal.      LABORATORY PANEL:   CBC  Recent Labs Lab 04/20/16 0420  WBC 4.8  HGB 11.2*  HCT 33.4*  PLT 105*   ------------------------------------------------------------------------------------------------------------------  Chemistries   Recent Labs Lab 04/18/16 0906  04/20/16 0420  NA 141  < > 142  K 3.7  < > 4.2  CL 101  < > 110  CO2 32  < > 28  GLUCOSE 151*  < > 73  BUN 12  < > 11  CREATININE 0.83  < > 0.67  CALCIUM 9.3  < > 8.5*  AST 32  --   --   ALT 20  --   --   ALKPHOS 130*  --   --   BILITOT 1.3*  --   --   < > = values in this interval not displayed. ------------------------------------------------------------------------------------------------------------------  Cardiac Enzymes  Recent Labs Lab 04/18/16 0906  TROPONINI <0.03   ------------------------------------------------------------------------------------------------------------------  RADIOLOGY:  Dg Chest 2 View  Result Date: 04/18/2016 CLINICAL DATA:  65 year old female with nonproductive cough for 4 days. Chest pain. Fever and hypoxia. Initial encounter. EXAM: CHEST  2 VIEW COMPARISON:  Chest radiographs and chest CT 04/04/2016 and earlier. FINDINGS: Large body habitus. Cervical ACDF hardware. Lower thoracic  spinal stimulator device again noted. Stable lung volumes. On the frontal view there is decreased visualization of the right hemidiaphragm, but this is not correlated on the lateral view. No pneumothorax. Stable pulmonary vascularity without overt edema. No pleural effusion. Stable cardiac size and mediastinal contours. No acute osseous abnormality identified. IMPRESSION: 1. Decreased ventilation at the anterior right lung base since 04/04/2016. This might represent atelectasis but is suspicious for bronchopneumonia in this clinical setting. No pleural effusion. 2. Otherwise stable chest. Electronically Signed   By: Genevie Ann M.D.   On: 04/18/2016 11:37   US Venous Img Lower Unilateral Right  Result Date: 04/18/2016 CLINICAL DATA:  Right leg swelling for 4 months. EXAM: RIGHT LOWER EXTREMITY VENOUS DOPPLER ULTRASOUND TECHNIQUE: Gray-scale sonography with graded compression, as well as color Doppler and duplex ultrasound were performed to evaluate the lower extremity deep venous systems from the level of the common femoral vein and including the common femoral, femoral, profunda femoral, popliteal and calf veins including the posterior tibial, peroneal and gastrocnemius veins when visible. The superficial great saphenous vein was also interrogated. Spectral Doppler was utilized to evaluate flow at rest and with distal augmentation maneuvers in the common femoral, femoral and popliteal veins. COMPARISON:  None. FINDINGS: Contralateral Common Femoral Vein: Respiratory phasicity is normal and symmetric with the symptomatic side. No evidence of thrombus. Normal compressibility. Common Femoral Vein: No evidence of thrombus. Normal compressibility, respiratory phasicity and response to augmentation. Saphenofemoral Junction: No evidence of thrombus. Normal compressibility and flow on color Doppler imaging. Profunda Femoral Vein: No evidence of thrombus. Normal compressibility and flow on color Doppler imaging. Femoral  Vein: No evidence of thrombus. Normal compressibility, respiratory phasicity and response to augmentation. Popliteal Vein: No evidence of thrombus. Normal compressibility, respiratory phasicity and response to augmentation. Calf Veins: No evidence of thrombus. Normal compressibility and flow on color Doppler imaging. Superficial Great Saphenous Vein: No evidence of thrombus. Normal compressibility and flow on color Doppler imaging. Venous Reflux:  None. Other Findings:  None. IMPRESSION: No evidence of deep venous thrombosis. Electronically Signed   By: Jerilynn Mages.  Shick M.D.   On: 04/18/2016 16:39     ASSESSMENT AND PLAN:   65 year old female with a history of hypertension, insulin-dependent diabetes, liver cirrhosis due to [facility] with recent hospitalization for urinary tract infection who presents due to acute hypoxic respiratory failure in the setting of pneumonia.  1. Acute hypoxic respiratory failure in the setting of pneumonia: Continue meropenem  MRSA PCR was negative last hospital stay. Wean oxygen 2. ESBL UTI from previous hospitalization: Patient was discharged on Ceftin however this was resistant and UA on admission shows urinary tract infection. She is now on meropenem. Can change to AUGMENTIN at discharge.  3. Right lower extremity cellulitis: resolved. Dopplers are negative for DVT.   4. Insulin-dependent diabetes: Continue Lantus, sliding scale, metformin ADA diet DM consult  5. COPD without exacerbation  6. Depression and anxiety: Continue Remeron, Ativan, Klonopin, Wellbutrin and Abilify  7. Peripheral neuropathy: Continue gabapentin.  LYRICA ON HOLD FOR NOW DUE TO LOW BP.  8. 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 Obstructive sleep apnea: Patient is encouraged to use her CPAP.  10. Hypokalemia: replete PRN  11. Hypotension: without symptoms and negative blood cx off of ALDACTONE Continue  to monitor ok to hold LYRICA today.   Management plans discussed with the patient and she is in agreement.  CODE STATUS: full  TOTAL TIME TAKING CARE OF THIS PATIENT: 24 minutes.    [facility] at discharge  POSSIBLE D/C tomorrow, DEPENDING ON CLINICAL CONDITION/blood pressure.   Denelda Akerley M.D on 04/20/2016 at 10:48 AM  Between 7am to 6pm - Pager - [phone] After 6pm go to [email or web address] - password [email or web address] [facility]  Office  [phone]  CC: Primary care physician; Sarajane Jews, MD  Note: This dictation was prepared with Dragon dictation along with smaller phrase technology. Any transcriptional errors that result from this process are unintentional.

## 2016-04-20 NOTE — Progress Notes (Signed)
Pharmacy Antibiotic Note  Stephanie [name] is a 65 y.o. female admitted on 04/18/2016 with pneumonia, cellulitis, and UTI with recent ESBL e. Coli.   Plan: Antibiotics consolidated 10/14 to meropenem only. Continue meropenem 1gm IV Q8H  Height: 5' (152.4 cm) Weight: 224 lb 3.2 oz (101.7 kg) IBW/kg (Calculated) : 45.5  Temp (24hrs), Avg:98.2 F (36.8 C), Min:97.9 F (36.6 C), Max:98.7 F (37.1 C)   Recent Labs Lab 04/18/16 0906 04/18/16 0943 04/19/16 0543 04/20/16 0420  WBC 10.2  --  5.9 4.8  CREATININE 0.83  --  0.86 0.67  LATICACIDVEN  --  1.6  --   --     Estimated Creatinine Clearance: 75.3 mL/min (by C-G formula based on SCr of 0.67 mg/dL).    Allergies  Allergen Reactions  . Doxycycline Hives, Swelling and Other (See Comments)    Reaction:  Facial swelling     Antimicrobials this admission: 11/13 Zosyn >> 11/13 x1 11/13 Levofloxacin >> 11/13 x 1 11/13 Vancomycin 11/13>>11/14 Meropenem 11/14 >>  Microbiology results: 11/13 BCx: NGTD 10/30 MRSA PCR negative 10/30 Urine: > 100k ESBL e. Coli sensitive to imipenem   Thank you for allowing pharmacy to be a part of this patient's care.  Cheri Guppy, PharmD Clinical Pharmacist  04/20/2016 10:28 AM

## 2016-04-21 LAB — GLUCOSE, CAPILLARY: GLUCOSE-CAPILLARY: 105 mg/dL — AB (ref 65–99)

## 2016-04-21 IMAGING — CT CT ABD-PELV W/ CM
1 of 3 series · 12 of 32 positions shown, 17 images · IV contrast (iopamidol)
Comparison: 07/14/2015

CLINICAL DATA: Fell today in [REDACTED] store.  Tripped over a cart.

EXAM:
CT CHEST, ABDOMEN, AND PELVIS WITH CONTRAST
TECHNIQUE: Multidetector CT imaging of the chest, abdomen and pelvis was
performed following the standard protocol during bolus
administration of intravenous contrast.
CONTRAST:  100mL [id number] IOPAMIDOL ([id number]) INJECTION 61%

[Series 2: cap with · axial · 0.96mm/px · z∈[-1082,-522]mm · 12 of 128 slices shown, 17 images]
[im 8/128  soft-tissue]
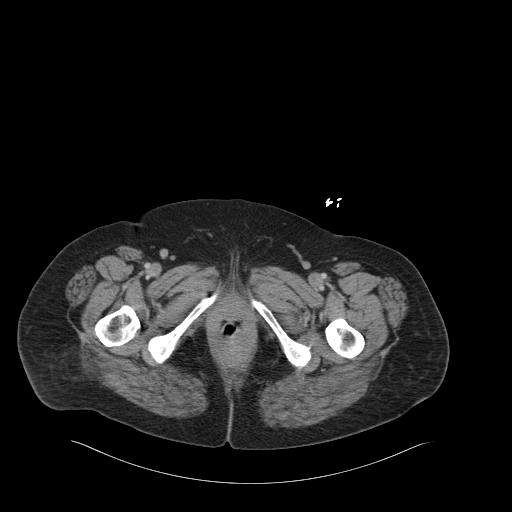
[im 8/128  bone]
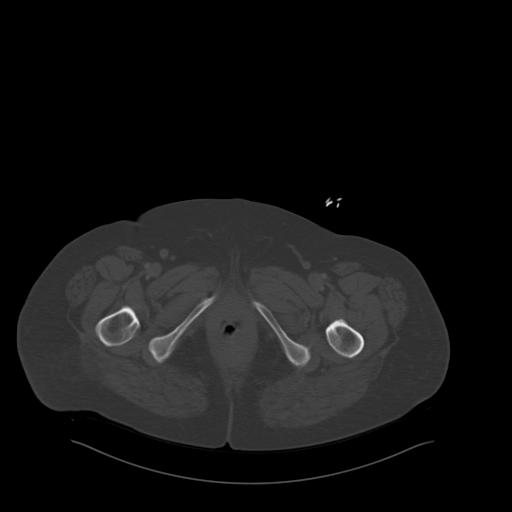
[im 23/128  soft-tissue]
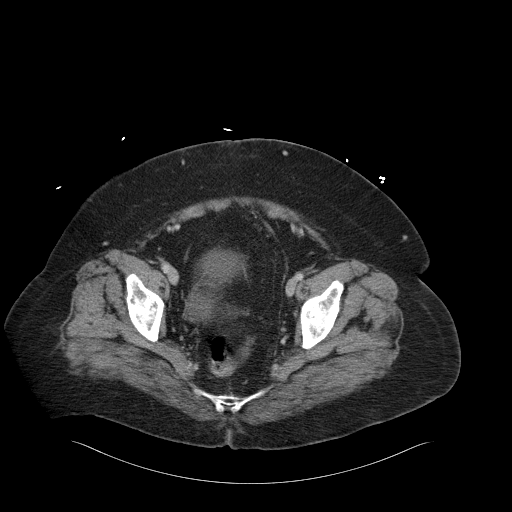
[im 30/128  soft-tissue]
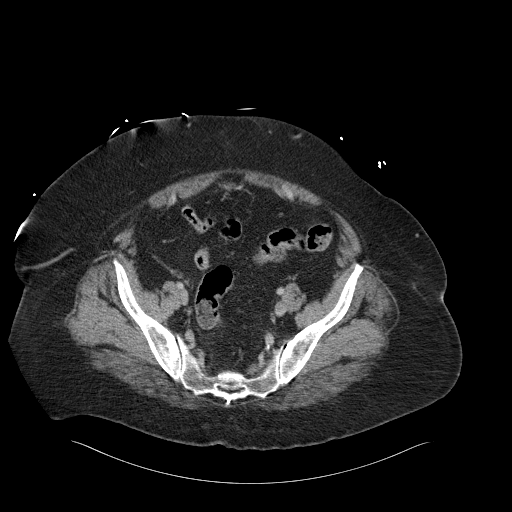
[im 45/128  soft-tissue]
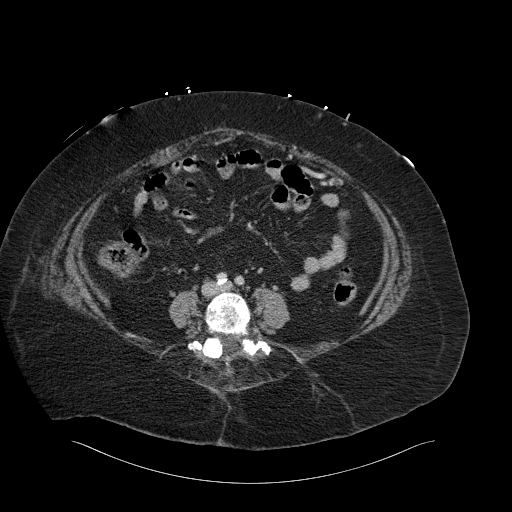
[im 53/128  soft-tissue]
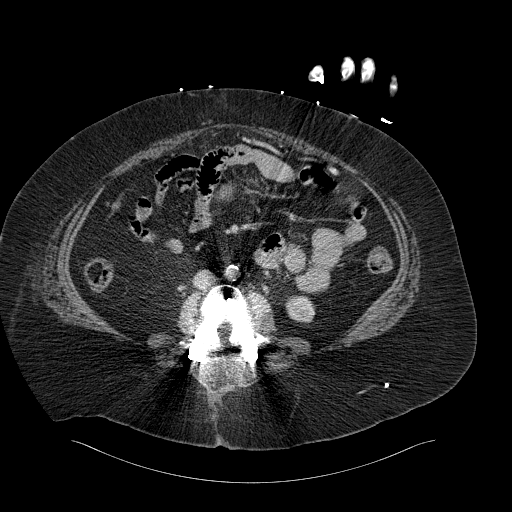
[im 68/128  soft-tissue]
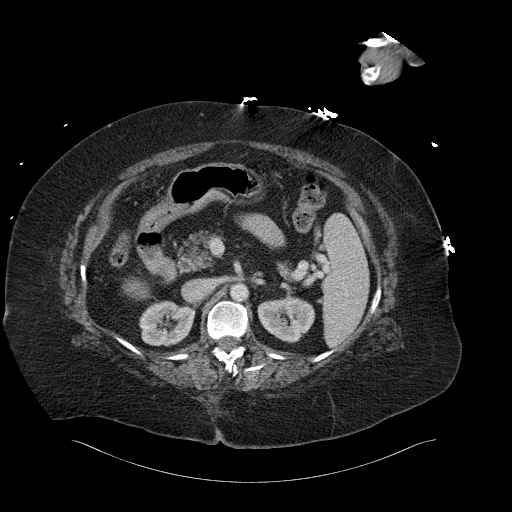
[im 75/128  soft-tissue]
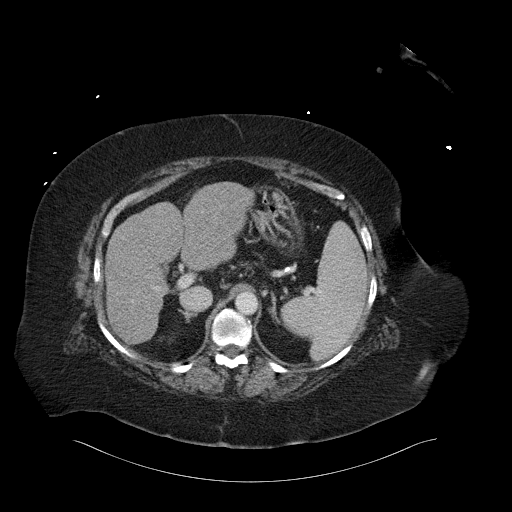
[im 83/128  soft-tissue]
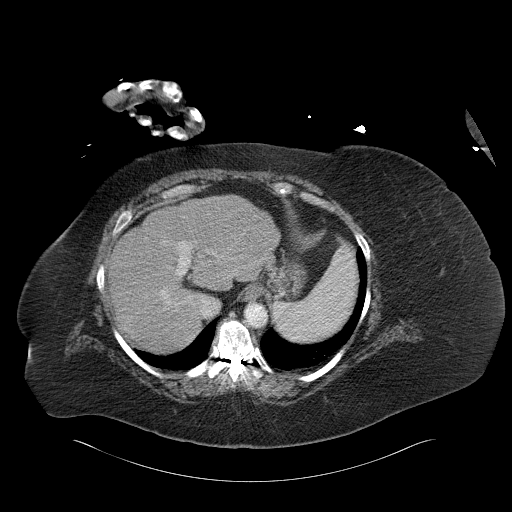
[im 98/128  soft-tissue]
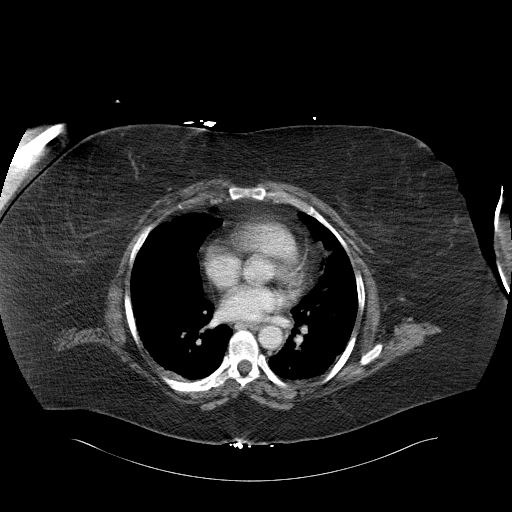
[im 98/128  lung]
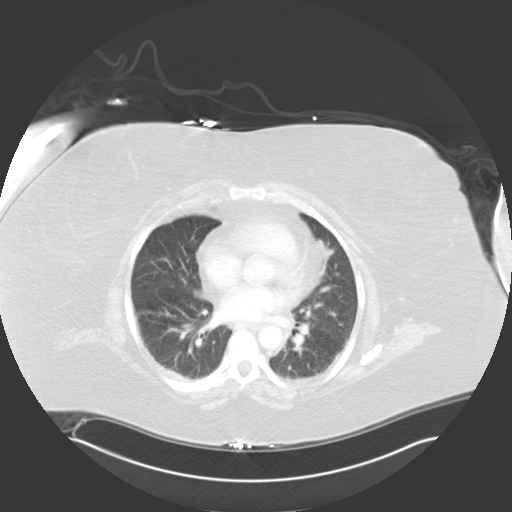
[im 98/128  bone]
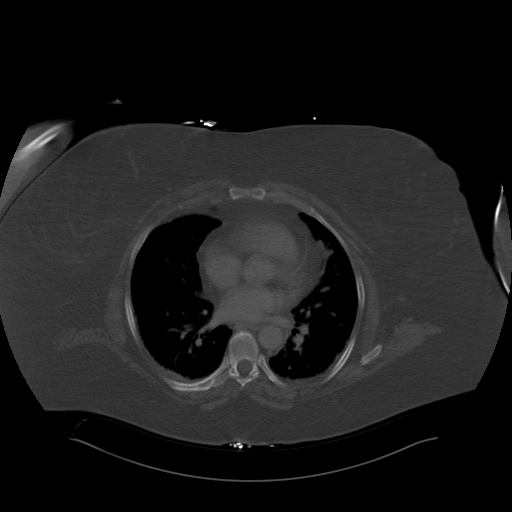
[im 105/128  soft-tissue]
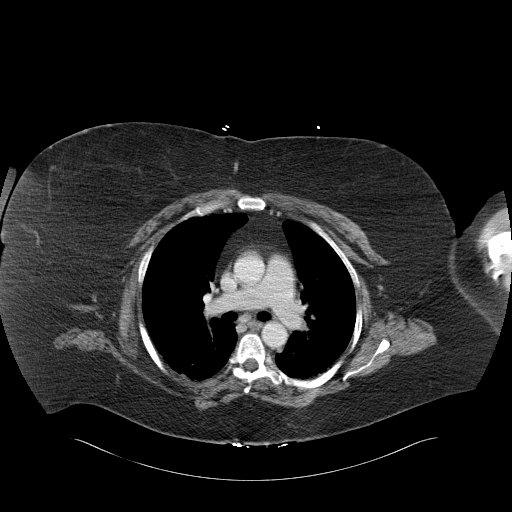
[im 105/128  lung]
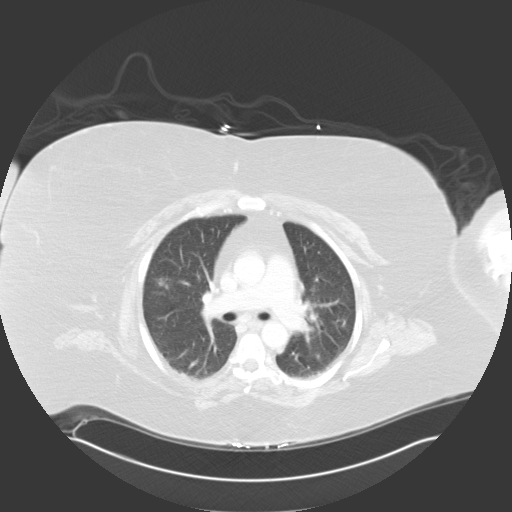
[im 113/128  lung]
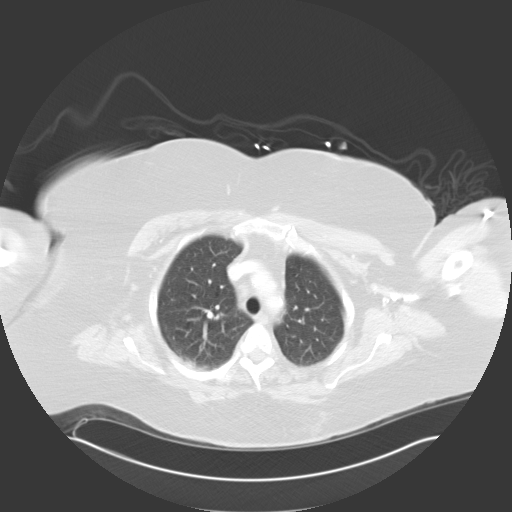
[im 120/128  soft-tissue]
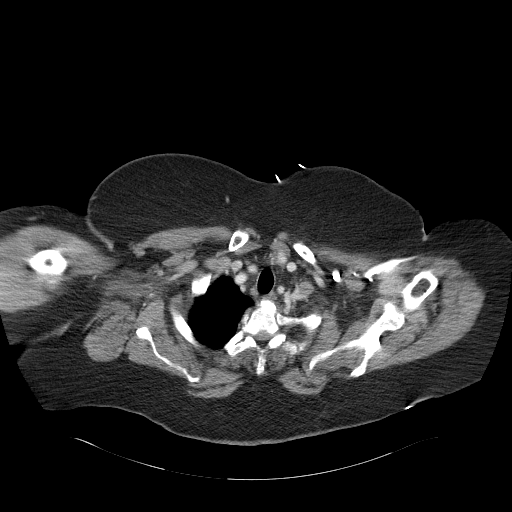
[im 120/128  lung]
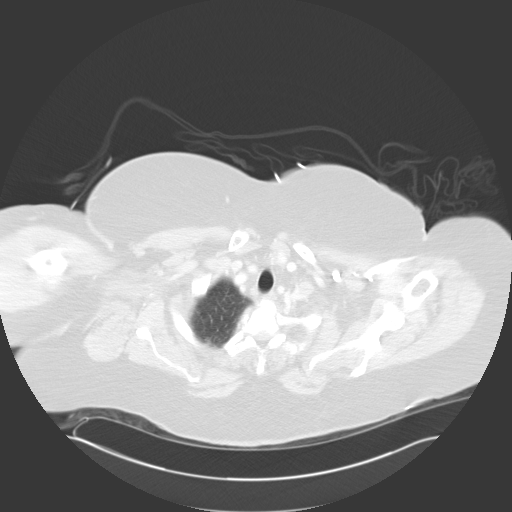

[12 of 32 positions shown; findings below may reference images not displayed]

FINDINGS: CT CHEST FINDINGS

Mediastinum/Lymph Nodes: No breast masses or chest wall hematoma. No
supraclavicular or axillary adenopathy. The thyroid gland appears
normal.

The heart is normal in size. No pericardial effusion. No mediastinal
or hilar mass or adenopathy or hematoma. The aorta is normal in
caliber. No dissection. Scattered atherosclerotic calcifications.
Coronary artery calcifications are no new line the esophagus is
grossly normal.

Lungs/Pleura: Exam is somewhat limited by breathing motion artifact.
There are areas of subpleural atelectasis and areas of mild
peribronchial thickening. A few patchy E 8 feet sub solid nodular
densities are noted. The largest measures 12 mm in the right upper
lobe on image number 25. No pulmonary contusion, pneumothorax or
pleural effusion.

Musculoskeletal: Cervical spine fusion hardware noted. Pole a spinal
cord stimulator noted in the thoracic spine. In the sternum is
intact. The thoracic vertebral bodies are normally aligned. No acute
bony findings. No definite rib fractures.

CT ABDOMEN PELVIS FINDINGS

Hepatobiliary: Stable cirrhotic changes involving the liver. No
focal hepatic lesions or acute hepatic injury. No biliary
dilatation. The gallbladder is surgically absent. No common bile
duct dilatation.

Pancreas: No mass, inflammation or acute injury.

Spleen: Stable mild splenomegaly.  No acute injury.

Adrenals/Urinary Tract: The adrenal glands and kidneys are
unremarkable and stable. No acute injury.

Stomach/Bowel: The stomach, duodenum, small bowel and colon are
grossly normal without oral contrast. No inflammatory changes, mass
lesions or obstructive findings. No acute injury. No free fluid or
free air.

Vascular/Lymphatic: Advanced atherosclerotic calcifications
involving the aorta and branch vessel ostia. No aneurysm or
dissection. The major venous structures are patent. No mesenteric or
retroperitoneal mass, adenopathy or hematoma.

Reproductive: The uterus and ovaries are surgically absent.

Other: No pelvic mass or adenopathy. No free pelvic fluid
collections. The bladder is normal. No inguinal mass or adenopathy.
No abdominal wall hernia or subcutaneous lesions. Surgical changes
from prior spinal surgery.

Musculoskeletal: No acute bony findings. Stable surgical changes
involving the lumbar spine. No acute fracture. The bony pelvis is
intact. The pubic symphysis and SI joints appear normal. Both hips
are normally located. No hip fracture.
IMPRESSION: 1. No acute injury involving the chest, abdomen or pelvis.
2. Mild emphysematous changes in the lungs and patchy airspace
nodules. This could reflect inflammatory change but recommend
followup noncontrast chest CT in 3 months.
3. Stable cirrhotic changes involving the liver with portal venous
hypertension and portal venous collaterals. Stable splenomegaly.
4. No acute bony findings.

## 2016-04-21 IMAGING — CR DG HUMERUS 2V *R*
2 series · 3 of 3 positions shown · non-contrast
Comparison: None.

CLINICAL DATA: Pain following fall earlier today

EXAM:
RIGHT HUMERUS - 2+ VIEW

[Series 1: humerus ap · 0.14mm/px · 2 of 2 slices shown]
[im 1/2]
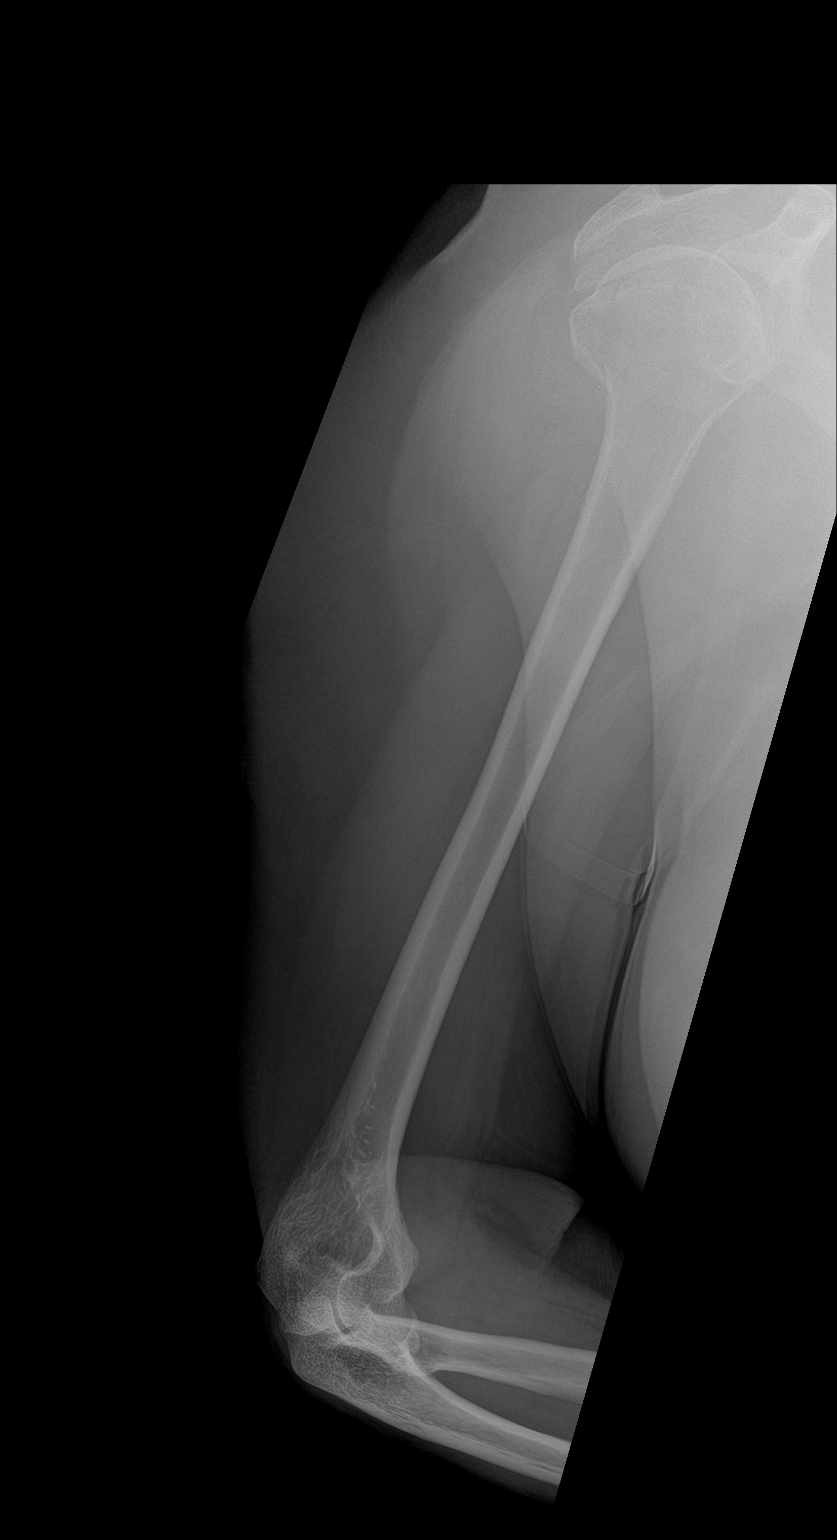
[im 2/2]
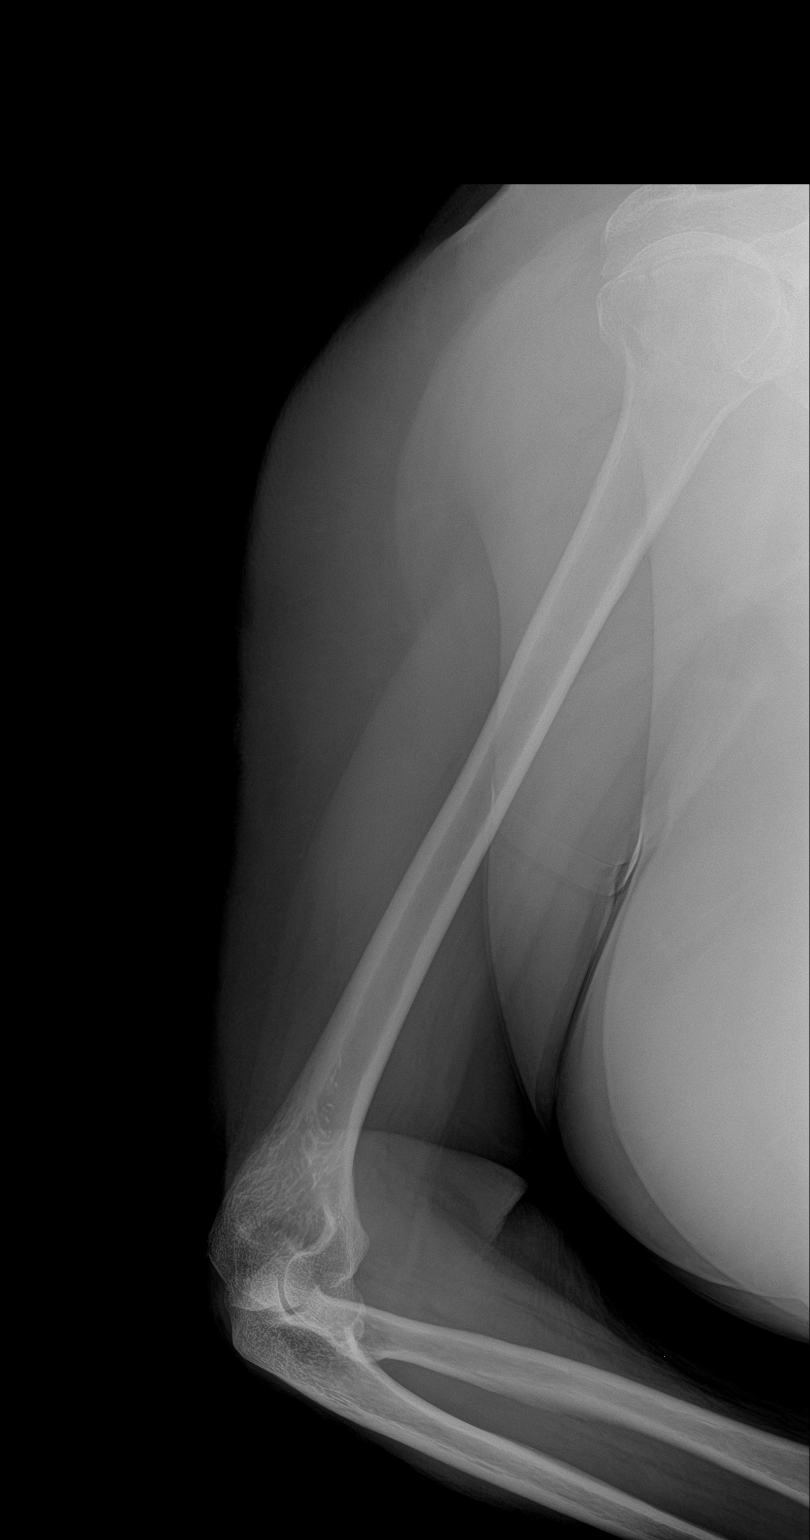

[humerus lat]
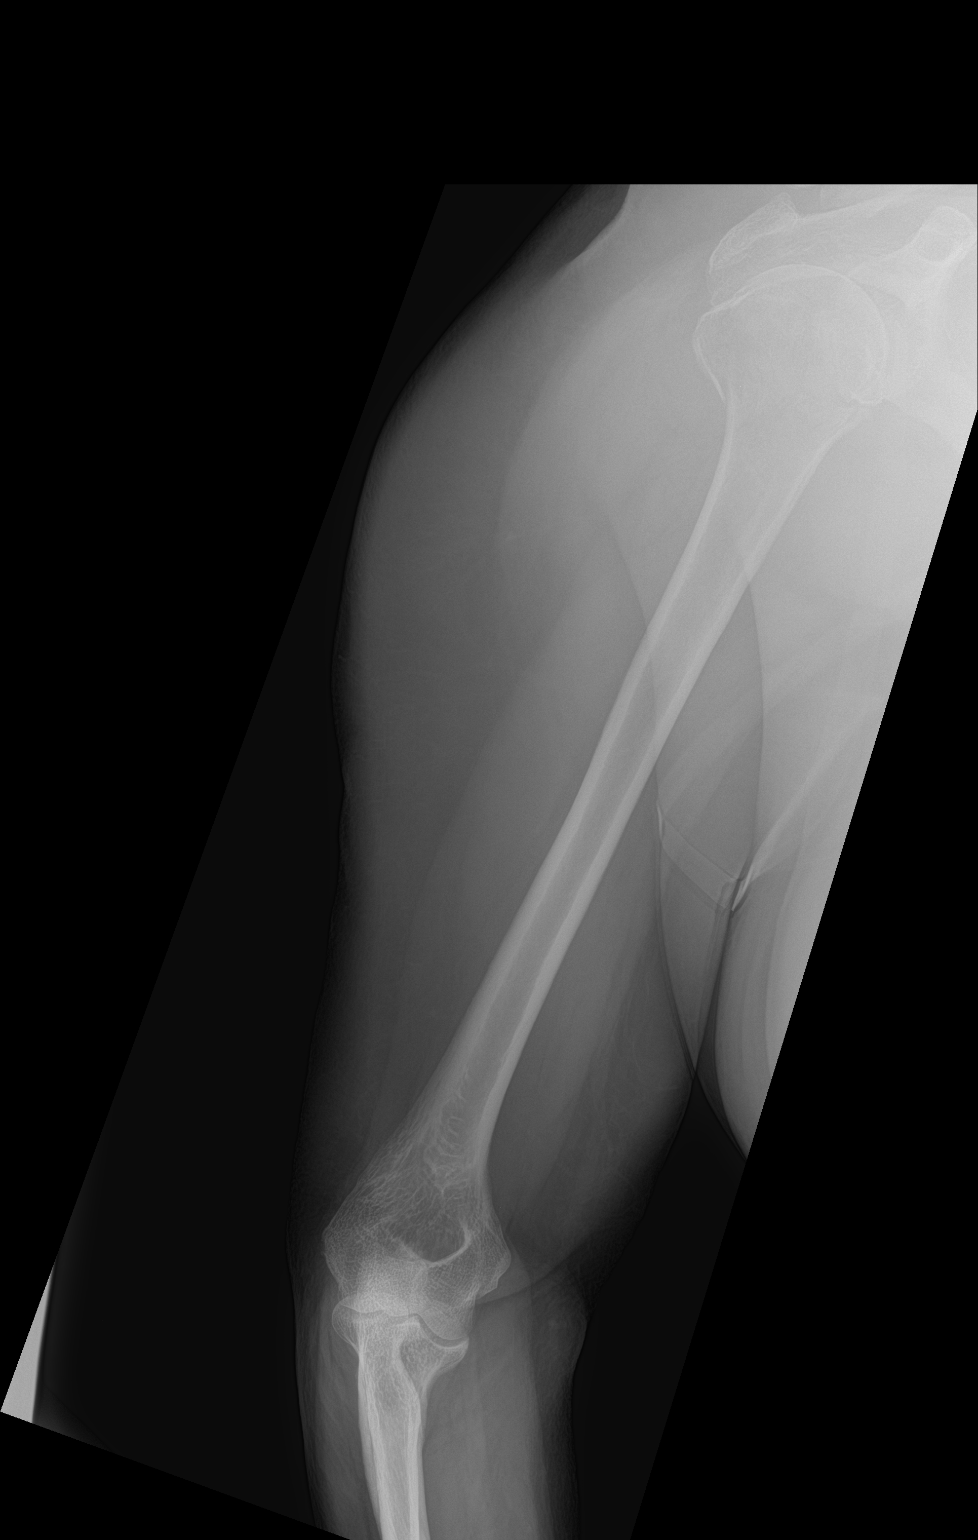

[3 of 3 positions shown; findings below may reference images not displayed]

FINDINGS: Frontal and lateral views were obtained. There is a fracture of the
proximal right humeral metaphysis with subtle impaction at the
fracture site. No other fracture. No dislocation. The joint spaces
appear unremarkable. Note that there is calcification in the
superior acromioclavicular joint on the right.
IMPRESSION: Impacted fracture proximal humeral metaphysis with alignment near
anatomic. No other fracture. No dislocation. Calcification is noted
in the superior aspect of the right acromioclavicular joint.

## 2016-04-21 IMAGING — CT CT CERVICAL SPINE W/O CM
2 series · 10 of 14 positions shown, 12 images · non-contrast
Comparison: 03/21/2015

CLINICAL DATA: Recent fall with neck pain, initial encounter

EXAM:
CT CERVICAL SPINE WITHOUT CONTRAST
TECHNIQUE: Multidetector CT imaging of the cervical spine was performed without
intravenous contrast. Multiplanar CT image reconstructions were also
generated.

[Series 3: c spine soft · axial · 0.32mm/px · z∈[-286,-158]mm · 5 of 96 slices shown]
[im 16/96  soft-tissue]
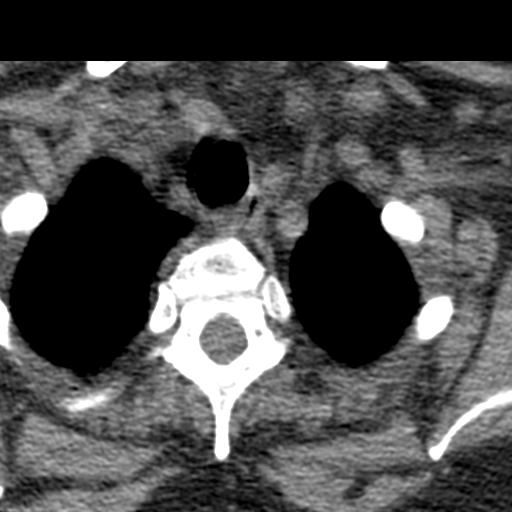
[im 32/96  soft-tissue]
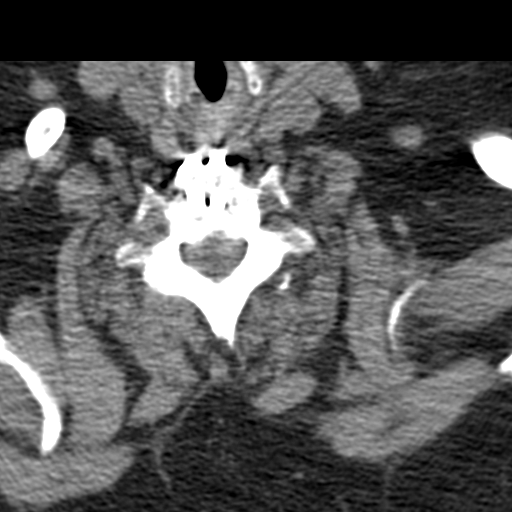
[im 48/96  soft-tissue]
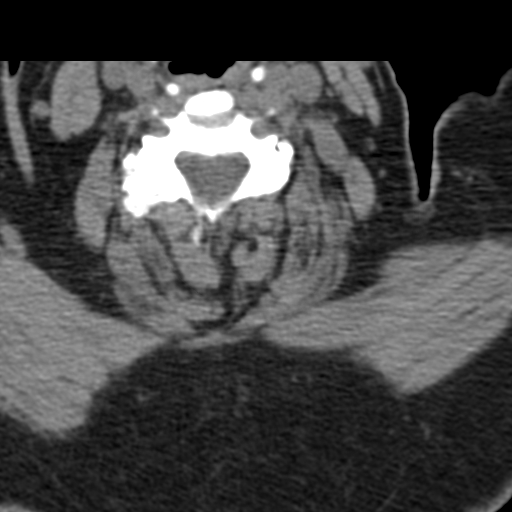
[im 64/96  soft-tissue]
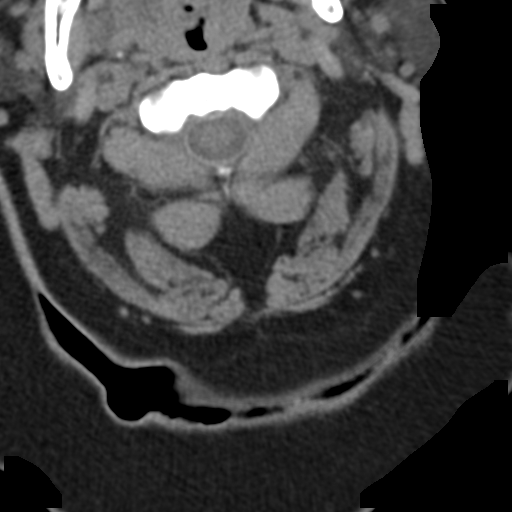
[im 80/96  soft-tissue]
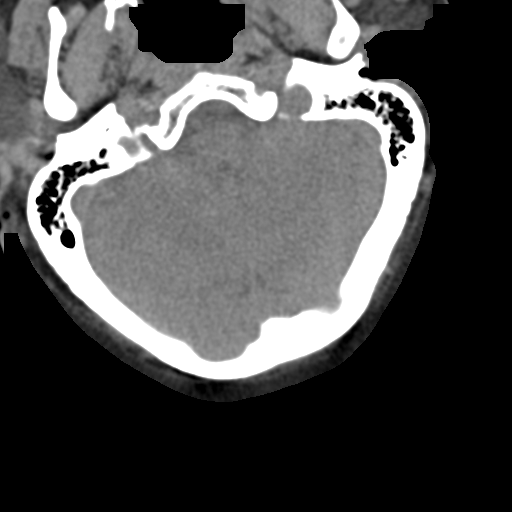

[Series 8: orthogonal axials · axial · 0.18mm/px · z∈[-312,-193]mm · 5 of 101 slices shown, 7 images]
[im 17/101  soft-tissue]
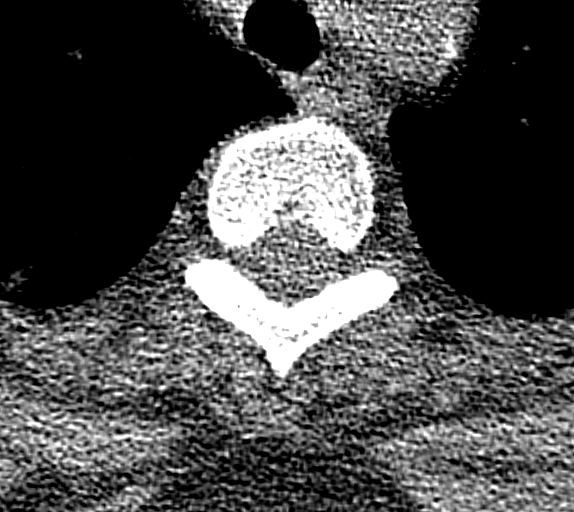
[im 17/101  bone]
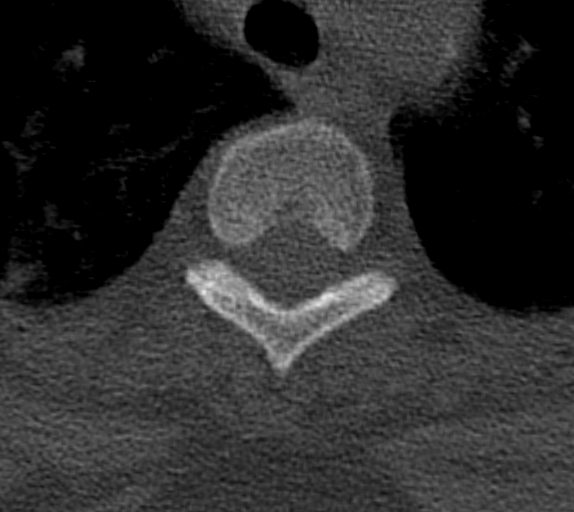
[im 34/101  bone]
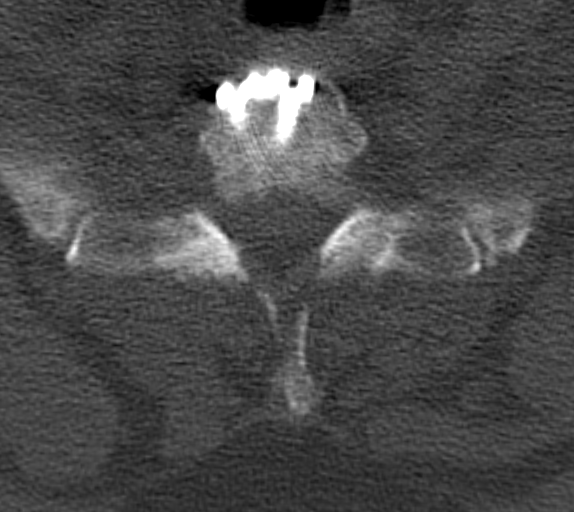
[im 51/101  bone]
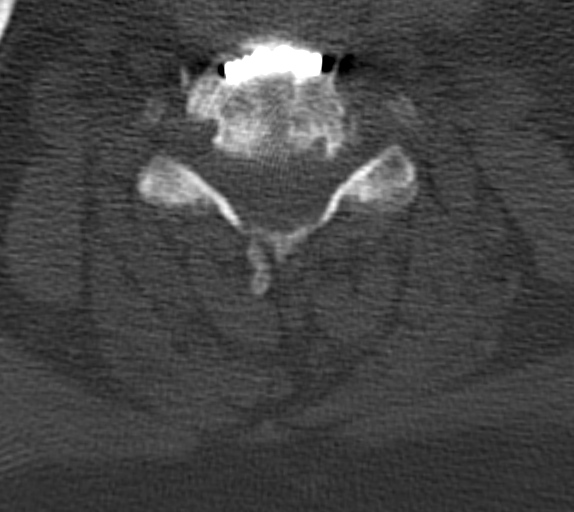
[im 67/101  bone]
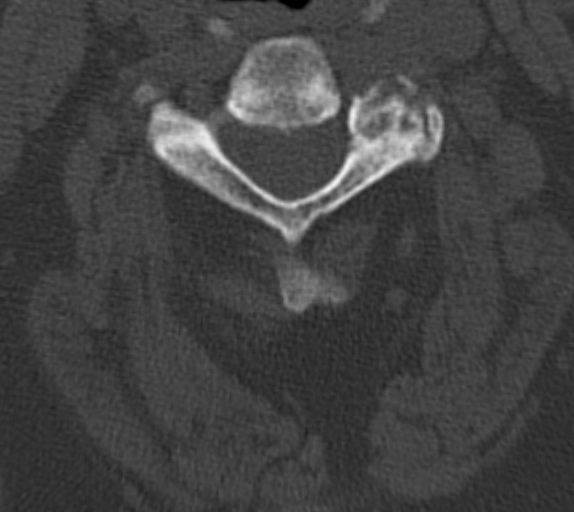
[im 84/101  soft-tissue]
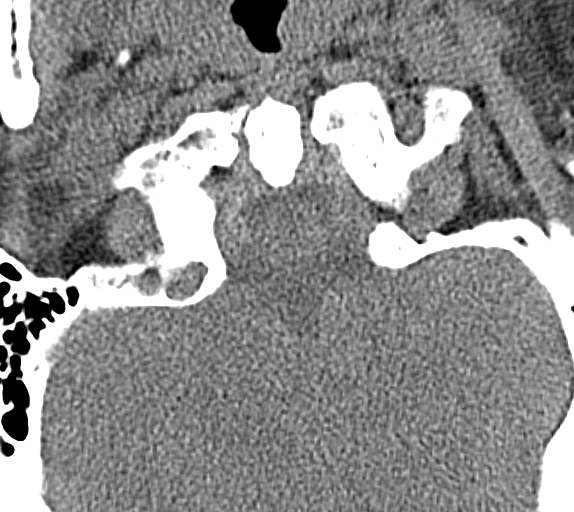
[im 84/101  bone]
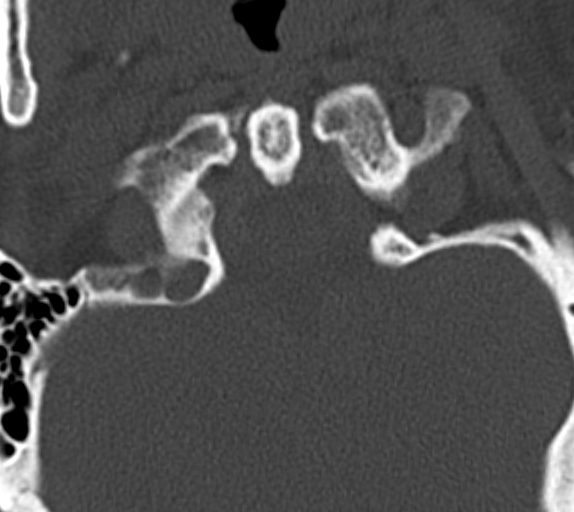

[10 of 14 positions shown; findings below may reference images not displayed]

FINDINGS: Seven cervical segments are well visualized. Changes of prior fusion
from C5-C7 are seen. Facet hypertrophic changes are noted at all
levels with varying degrees of neural foraminal narrowing. No acute
fracture or acute facet abnormality is noted. The surrounding soft
tissues are within normal limits.
IMPRESSION: Multilevel degenerative changes and postoperative changes. No acute
bony abnormality is noted.

## 2016-04-21 IMAGING — CR DG SHOULDER 2+V*R*
3 series · 4 of 4 positions shown · non-contrast
Comparison: None.

CLINICAL DATA: Right shoulder pain post fall at grocery store this
morning

EXAM:
RIGHT SHOULDER - 2+ VIEW

[shoulder grashey]
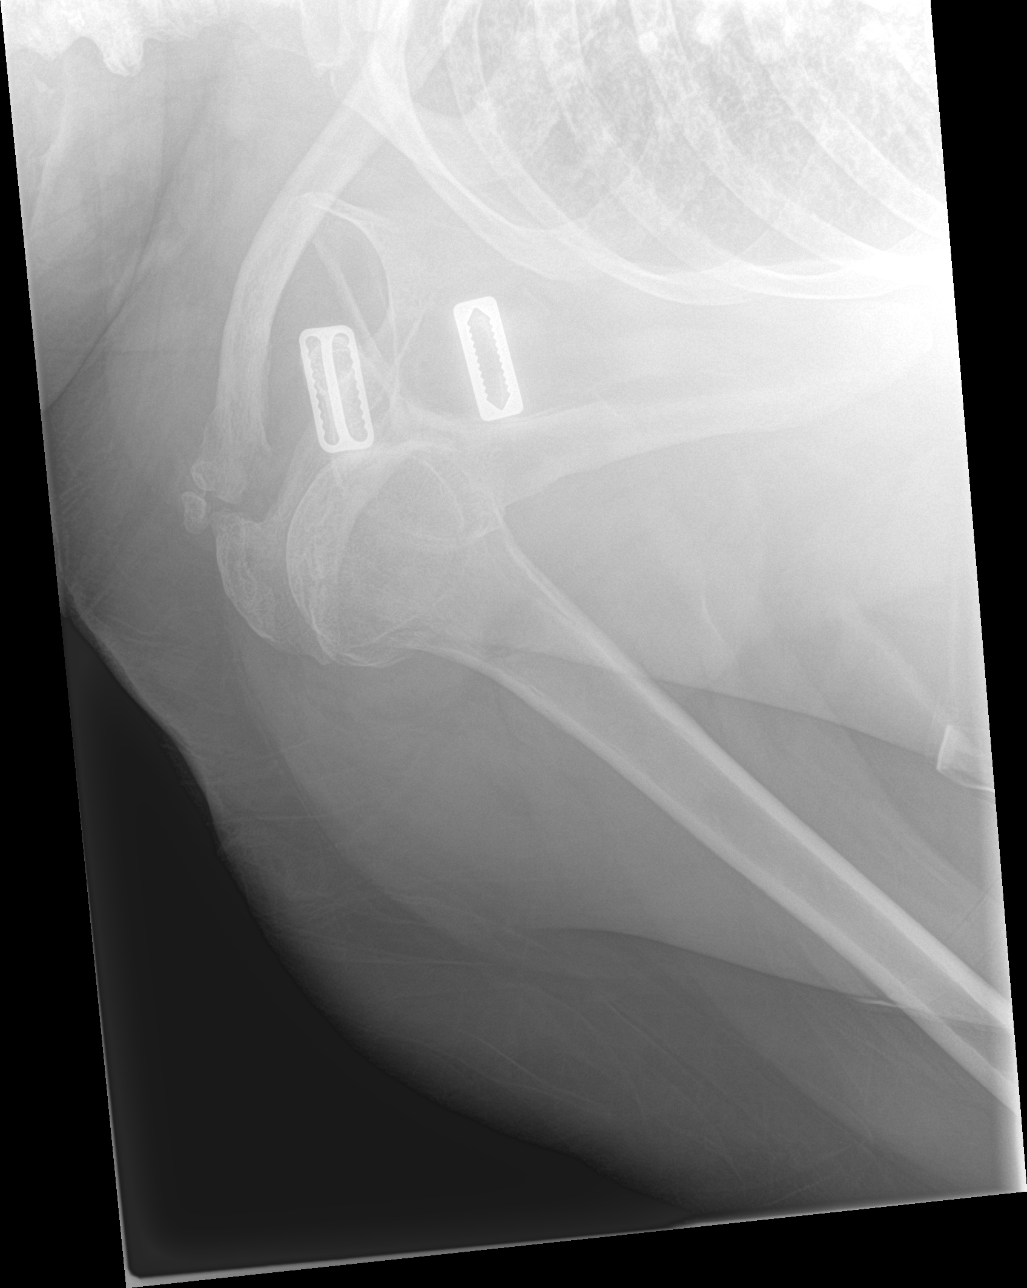

[shoulder y view]
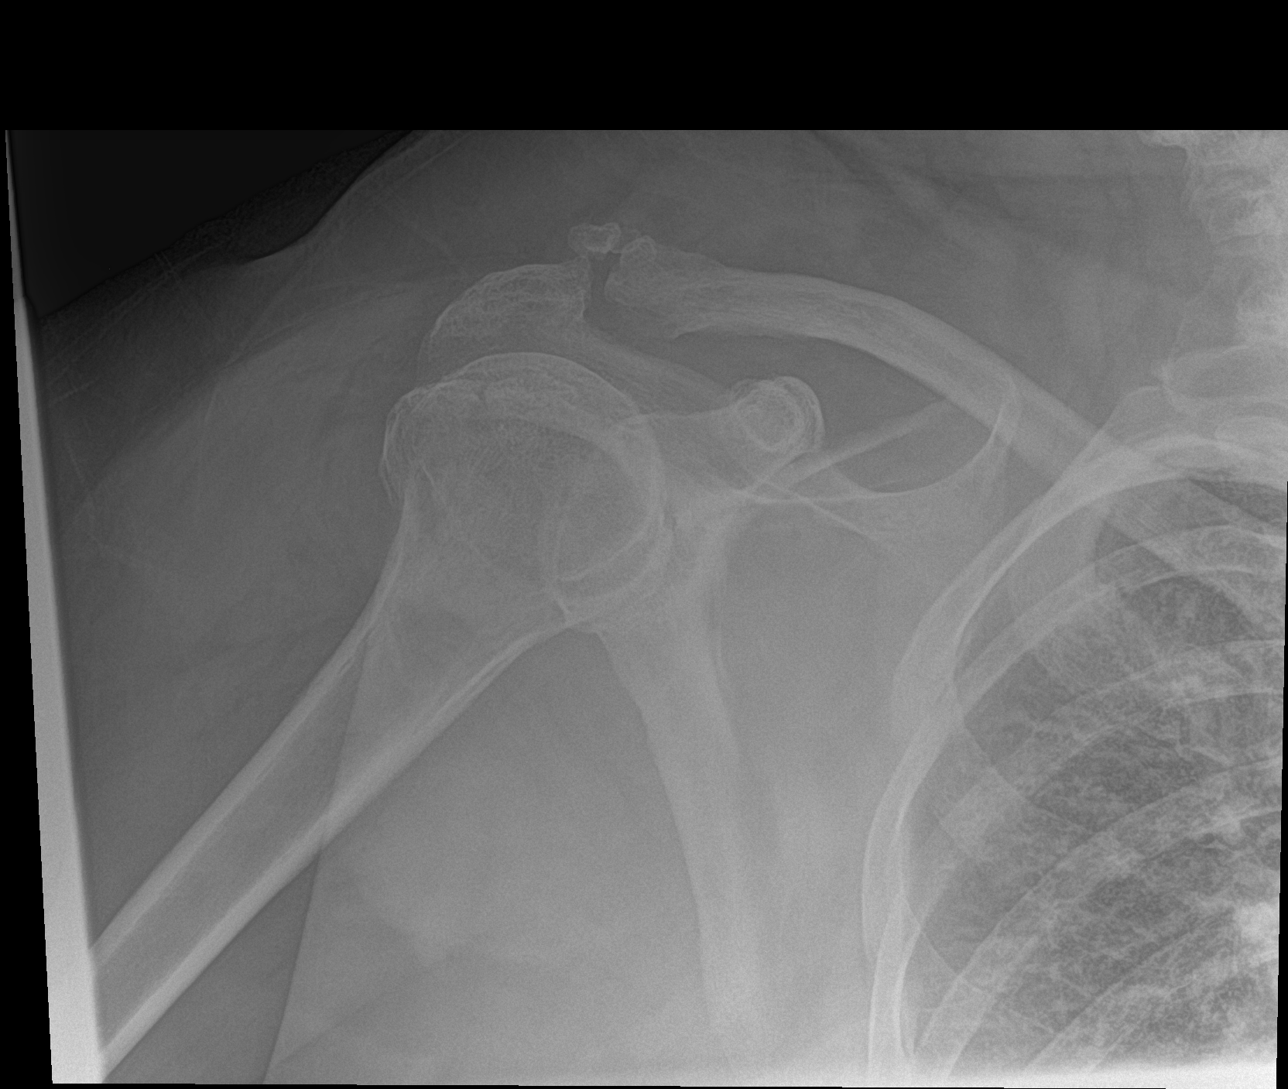

[Series 3: shoulder axillary · 0.14mm/px · 2 of 2 slices shown]
[im 1/2]
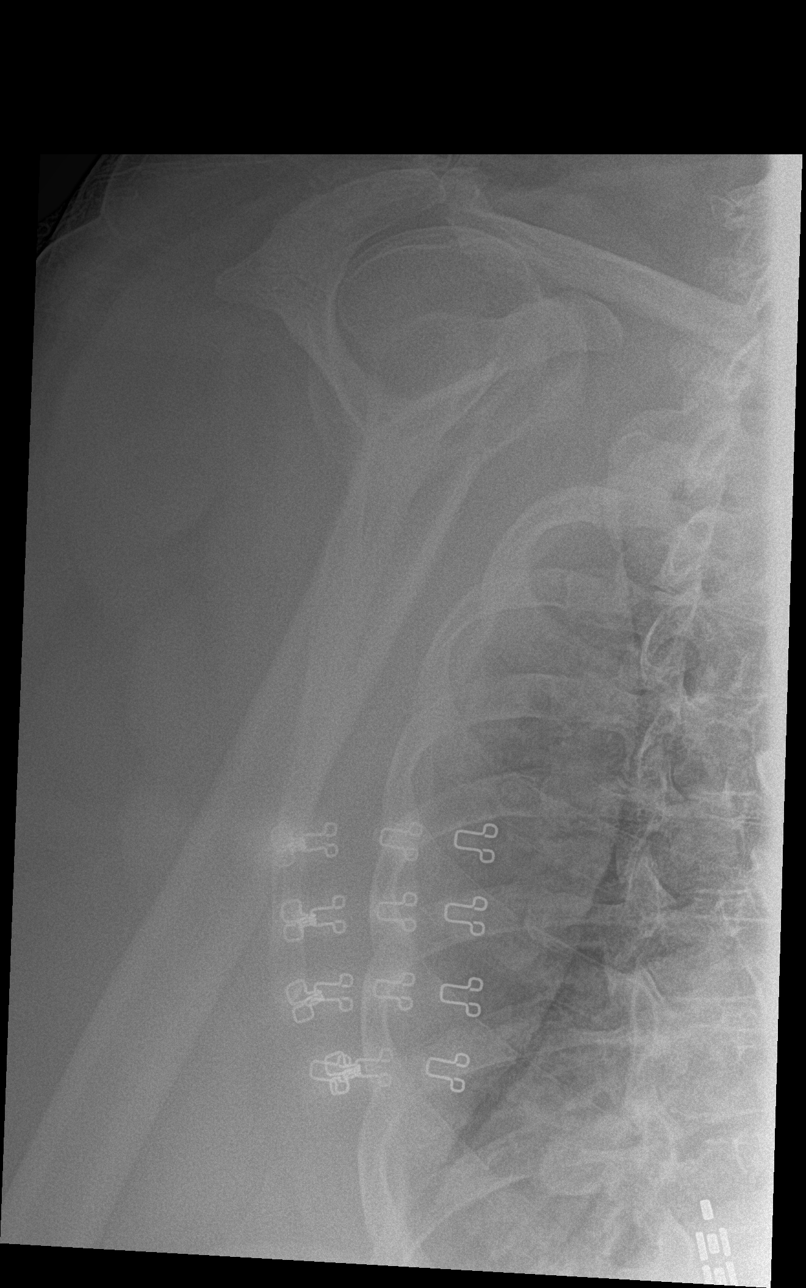
[im 2/2]
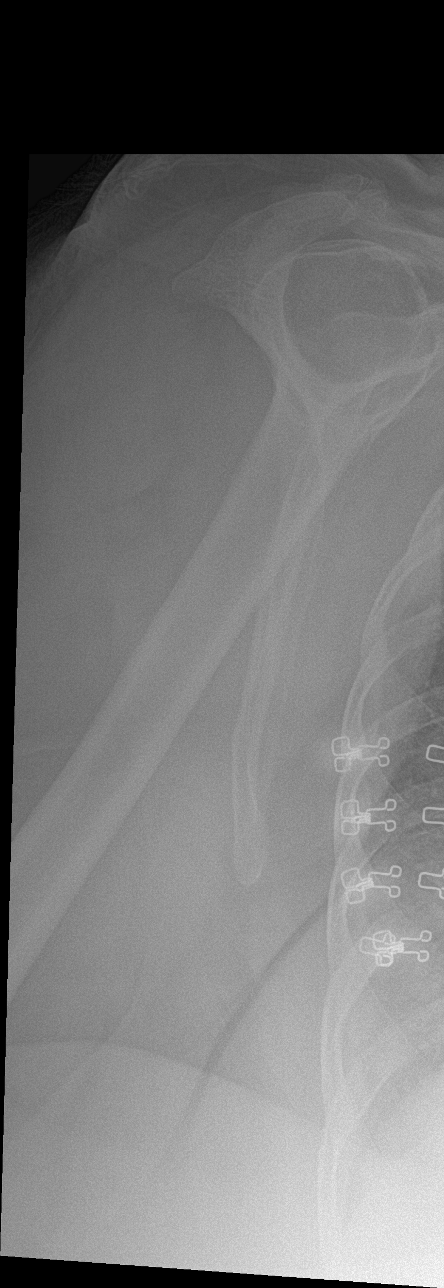

[4 of 4 positions shown; findings below may reference images not displayed]

FINDINGS: Four views of the right shoulder submitted. Degenerative changes are
noted acromioclavicular joint. Dystrophic calcifications are noted
just above the acromioclavicular joint. There is probable subtle
nondisplaced fracture of the right greater humeral tuberosity.
IMPRESSION: Probable subtle nondisplaced fracture of greater humeral tuberosity.
Degenerative changes AC joint.

## 2016-04-21 MED ORDER — AMOXICILLIN-POT CLAVULANATE 875-125 MG PO TABS: 1.0000 | ORAL_TABLET | Freq: Two times a day (BID) | ORAL | 0 refills | Status: AC

## 2016-04-21 MED ORDER — NITROFURANTOIN MONOHYD MACRO 100 MG PO CAPS: 100.0000 mg | ORAL_CAPSULE | Freq: Two times a day (BID) | ORAL | 0 refills | Status: AC

## 2016-04-21 NOTE — NC FL2 (Signed)
[place] LEVEL OF CARE SCREENING TOOL     IDENTIFICATION  Patient Name: Stephanie [name] Birthdate: 16-Feb-1951 Sex: female Admission Date (Current Location): 04/18/2016  [place] and [place] Number:  Stephanie [name] [id number] [place] and Address:  [facility], [address], [place], [place] [address]      Provider Number: [id number]  Attending Physician Name and Address:  Bettey Costa, MD  Relative Name and Phone Number:  [name] [phone]     Current Level of Care: Hospital Recommended Level of Care: [facility] Prior Approval Number:    Date Approved/Denied:   PASRR Number:    Discharge Plan: Domiciliary (Rest home) ([facility])    Current Diagnoses: Patient Active Problem List   Diagnosis Date Noted  . Pneumonia 04/18/2016  . Chest pain 04/04/2016  . Iron deficiency anemia 01/30/2016  . Hypoglycemia 09/09/2015  . Hyperglycemia 07/16/2015  . Neuropathy ([place]) 06/08/2015  . Thrombocytopenia ([place]) 08/19/2014  . Abdominal pain, chronic, epigastric 06/12/2014  . Liver cirrhosis secondary to NASH ([place]) 02/27/2014  . Dizziness and giddiness 10/30/2013  . DOE (dyspnea on exertion) 10/30/2013  . Poorly controlled type 2 diabetes mellitus with complication ([place]) 28/41/3244  . DNR (do not resuscitate) 07/29/2013  . Encounter for routine gynecological examination 07/29/2013  . Dyspnea 07/10/2013  . Lung nodule 07/10/2013  . Varicose veins of lower extremities with other complications 06/08/7251  . GERD (gastroesophageal reflux disease) 10/09/2012  . Cirrhosis of liver ([place]) 02/02/2012  . Anxiety 07/11/2011  . Aortic dissection ([place])   . BACK PAIN, LUMBAR, CHRONIC 11/19/2009  . UNS ADVRS EFF OTH RX MEDICINAL&BIOLOGICAL SBSTNC 12/16/2008  . UNSPECIFIED VITAMIN D DEFICIENCY 09/26/2008  . TOBACCO ABUSE 06/19/2008  . RESTLESS LEGS SYNDROME 04/27/2007  . INSOMNIA 04/27/2007  . HLD (hyperlipidemia) 03/08/2007   . OSA (obstructive sleep apnea) 03/08/2007  . Essential hypertension 03/08/2007  . Asthma 03/08/2007  . BIPOLAR AFFECTIVE DISORDER, HX OF 03/08/2007    Orientation RESPIRATION BLADDER Height & Weight     Self, Time, Situation, Place  Normal Continent Weight: 224 lb 3.2 oz (101.7 kg) Height:  5' (152.4 cm)  BEHAVIORAL SYMPTOMS/MOOD NEUROLOGICAL BOWEL NUTRITION STATUS      Continent Diet (Carb Modified)  AMBULATORY STATUS COMMUNICATION OF NEEDS Skin   Supervision Verbally Normal                       Personal Care Assistance Level of Assistance  Bathing, Dressing, Feeding Bathing Assistance: Limited assistance Feeding assistance: Independent Dressing Assistance: Independent     Functional Limitations Info  Sight, Hearing, Speech Sight Info: Adequate Hearing Info: Adequate Speech Info: Adequate    SPECIAL CARE FACTORS FREQUENCY  PT (By licensed PT)     PT Frequency: 2x a week  Home Health              Contractures Contractures Info: Not present    Additional Factors Info  Code Status, Allergies Code Status Info: Full Code Allergies Info: DOXYCYCLINE   Psychotropic: ARIPiprazole (ABILIFY) tablet 30 mg buPROPion (WELLBUTRIN SR) 12 hr tablet 150 mg clonazePAM (KLONOPIN) tablet 0.5 mg zolpidem (AMBIEN) tablet 5 mg Insulin Sliding Scale insulin aspart (novoLOG) injection 0-9 Units 3 times daily with meals  insulin aspart (novoLOG) injection 0-5 Units Daily at bedtime              Current Medications (04/21/2016):  This is the current hospital active medication list Current Facility-Administered Medications  Medication Dose Route Frequency  Provider Last Rate Last Dose  . 0.9 %  sodium chloride infusion   Intravenous Continuous Alexis Hugelmeyer, DO 75 mL/hr at 04/20/16 1849    . acetaminophen (TYLENOL) tablet 650 mg  650 mg Oral Q6H PRN Gladstone Lighter, MD      . ARIPiprazole (ABILIFY) tablet 30 mg  30 mg Oral Daily Gladstone Lighter, MD   30 mg at  04/21/16 0903  . buPROPion (WELLBUTRIN SR) 12 hr tablet 150 mg  150 mg Oral Daily Gladstone Lighter, MD   150 mg at 04/21/16 0902  . clonazePAM (KLONOPIN) tablet 0.5 mg  0.5 mg Oral BID Gladstone Lighter, MD   0.5 mg at 04/21/16 0903  . dicyclomine (BENTYL) capsule 10 mg  10 mg Oral TID Gladstone Lighter, MD   10 mg at 04/21/16 0903  . enoxaparin (LOVENOX) injection 40 mg  40 mg Subcutaneous Q12H Gladstone Lighter, MD   40 mg at 04/20/16 2217  . ferrous sulfate tablet 325 mg  325 mg Oral BID Gladstone Lighter, MD   325 mg at 04/21/16 0903  . gabapentin (NEURONTIN) tablet 600 mg  600 mg Oral BID Gladstone Lighter, MD   600 mg at 04/21/16 0903  . guaiFENesin (MUCINEX) 12 hr tablet 600 mg  600 mg Oral BID Gladstone Lighter, MD   600 mg at 04/21/16 0903  . insulin aspart (novoLOG) injection 0-5 Units  0-5 Units Subcutaneous QHS Gladstone Lighter, MD      . insulin aspart (novoLOG) injection 0-9 Units  0-9 Units Subcutaneous TID WC Gladstone Lighter, MD   1 Units at 04/20/16 1302  . insulin glargine (LANTUS) injection 20 Units  20 Units Subcutaneous QHS Gladstone Lighter, MD   20 Units at 04/20/16 2219  . ipratropium-albuterol (DUONEB) 0.5-2.5 (3) MG/3ML nebulizer solution 3 mL  3 mL Nebulization Q6H PRN Gladstone Lighter, MD      . lactulose (CHRONULAC) 10 GM/15ML solution 10 g  10 g Oral Daily Gladstone Lighter, MD   10 g at 04/20/16 0958  . lamoTRIgine (LAMICTAL) tablet 100 mg  100 mg Oral BID Gladstone Lighter, MD   100 mg at 04/21/16 0903  . loperamide (IMODIUM) capsule 4 mg  4 mg Oral PRN Gladstone Lighter, MD      . LORazepam (ATIVAN) tablet 0.5 mg  0.5 mg Oral Daily PRN Gladstone Lighter, MD      . MEDLINE mouth rinse  15 mL Mouth Rinse BID Gladstone Lighter, MD   15 mL at 04/20/16 2200  . meropenem (MERREM) IVPB SOLR 1 g  1 g Intravenous Q8H Gladstone Lighter, MD   1 g at 04/21/16 0634  . metFORMIN (GLUCOPHAGE) tablet 1,000 mg  1,000 mg Oral Q breakfast Pernell Dupre, RPH   1,000 mg at  04/21/16 0902   And  . metFORMIN (GLUCOPHAGE) tablet 500 mg  500 mg Oral Q supper Pernell Dupre, RPH   500 mg at 04/20/16 1724  . metoCLOPramide (REGLAN) tablet 5 mg  5 mg Oral TID AC & HS Gladstone Lighter, MD   5 mg at 04/21/16 0902  . mirtazapine (REMERON) tablet 15 mg  15 mg Oral QHS Gladstone Lighter, MD   15 mg at 04/20/16 2218  . mometasone-formoterol (DULERA) 200-5 MCG/ACT inhaler 2 puff  2 puff Inhalation BID Gladstone Lighter, MD   2 puff at 04/21/16 0905  . multivitamin with minerals tablet 1 tablet  1 tablet Oral Q breakfast Gladstone Lighter, MD   1 tablet at 04/21/16 0902  . oxybutynin (DITROPAN-XL)  24 hr tablet 5 mg  5 mg Oral Daily Gladstone Lighter, MD   5 mg at 04/21/16 0903  . oxyCODONE (Oxy IR/ROXICODONE) immediate release tablet 5 mg  5 mg Oral Q12H PRN Gladstone Lighter, MD   5 mg at 04/19/16 0823  . pantoprazole (PROTONIX) EC tablet 40 mg  40 mg Oral Daily Gladstone Lighter, MD   40 mg at 04/21/16 0902  . rOPINIRole (REQUIP) tablet 3 mg  3 mg Oral QHS Gladstone Lighter, MD   3 mg at 04/20/16 2218  . senna (SENOKOT) tablet 8.6 mg  1 tablet Oral Once per day on Mon Wed Fri Gladstone Lighter, MD   8.6 mg at 04/20/16 0959  . simvastatin (ZOCOR) tablet 10 mg  10 mg Oral QHS Gladstone Lighter, MD   10 mg at 04/20/16 2217  . traMADol (ULTRAM) tablet 50 mg  50 mg Oral Q6H PRN Gladstone Lighter, MD   50 mg at 04/20/16 0958  . vitamin C (ASCORBIC ACID) tablet 500 mg  500 mg Oral BID Gladstone Lighter, MD   500 mg at 04/21/16 4373  . zolpidem (AMBIEN) tablet 5 mg  5 mg Oral QHS Gladstone Lighter, MD   5 mg at 04/20/16 2218     Discharge Medications: Please see discharge summary for a list of discharge medications.  Relevant Imaging Results:  Relevant Lab Results:   Additional Information SSN [id number]  Ross Ludwig

## 2016-04-21 NOTE — Clinical Social Work Note (Signed)
Patient to be d/c'ed today to [facility].  Patient and family agreeable to plans will transport via patient's family car.  Evette Cristal, MSW Mon-Fri 8a-4:30p [phone]

## 2016-04-21 NOTE — Care Management (Signed)
Discharge to [facility] today per Dr. Benjie Karvonen. Will need Home Health and physical therapy in the facility. Will update Rise Patience, RN representative for [facility]. Family will transport. Shelbie Ammons RN MSN CCM Care Management [phone]

## 2016-04-21 NOTE — Discharge Summary (Signed)
[place] [facility] [place] NAME: Stephanie [name]    MR#:  [id number]  DATE OF BIRTH:  06/16/1950  DATE OF ADMISSION:  04/18/2016 ADMITTING PHYSICIAN: Gladstone Lighter, MD  DATE OF DISCHARGE: 04/21/2016  PRIMARY CARE PHYSICIAN: Sarajane Jews, MD    ADMISSION DIAGNOSIS:  Swelling [R60.9] Hypoxia [R09.02] Healthcare-associated pneumonia [J18.9]  DISCHARGE DIAGNOSIS:  Active Problems:   Pneumonia   SECONDARY DIAGNOSIS:   Past Medical History:  Diagnosis Date  . Adenomatous colon polyp 08/27/2014   Polyps x 3  . Anemia   . Anxiety   . 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 Arthritis   . Asthma   . Bipolar affective disorder ([place])    h/o  . Blood transfusion 2000  . Blood transfusion without reported diagnosis   . Chronic abdominal pain    abdominal wall pain  . 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 Colon polyp   . Depression   . Diabetes mellitus without complication ([place])   . Fatty liver 04/09/08   found in abd CT  . GERD (gastroesophageal reflux disease)   . H/O: CVA (cardiovascular accident)    TIA  . H/O: rheumatic fever   . Hyperlipidemia   . Incontinence   . Insomnia   . Obstructive sleep apnea    not using CPAP, last sleep study 2.5 years ago, per pt doctor is aware  . Platelets decreased ([place])   . RLS (restless legs syndrome)   . Shortness of breath   . Sleep apnea   . Stroke [facility])    TIA- 2002  . Ulcer ([place])   . Unspecified disorders of nervous system     HOSPITAL COURSE:  65 year old female with a history of hypertension, insulin-dependent diabetes, liver cirrhosis due to [facility] with recent hospitalization for urinary tract infection who presents due to acute hypoxic respiratory failure in the setting of pneumonia.  1. Acute hypoxic respiratory failure in the setting of pneumonia: She was on meropenem while in the hospital. MRSA PCR was negative last hospital stay. O2 was weaned to room air and her blooc  cultures are negative.   2. ESBL UTI from previous hospitalization: Patient was discharged on Ceftin however this was resistant and UA on admission shows urinary tract infection. She was on meropenem and changed to AUGMENTIN and MACROBID(per sensitivities)  at discharge. She needs repeat UA to assure clearance in 10 days.   3. Right lower extremity cellulitis:This has resolved. Dopplers are negative for DVT.   4. Insulin-dependent diabetes:She may continue Lantus and outpatient medications  With ADA diet  5. COPD without exacerbation  6. Depression and anxiety: Continue Remeron, Ativan, Klonopin, Wellbutrin and Abilify  7. Peripheral neuropathy: Continue gabapentin and LYRICA.  8. 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  9. Obstructive sleep apnea: Patient is encouraged to use her CPAP.  10. Hypokalemia: repleted PRN      DISCHARGE CONDITIONS AND DIET:   Stable Diabetic diet  CONSULTS OBTAINED:    DRUG ALLERGIES:   Allergies  Allergen Reactions  . Doxycycline Hives, Swelling and Other (See Comments)    Reaction:  Facial swelling     DISCHARGE MEDICATIONS:   Current Discharge Medication List    START taking these medications   Details  amoxicillin-clavulanate (AUGMENTIN) 875-125 MG tablet Take 1 tablet by mouth  2 (two) times daily. Qty: 16 tablet, Refills: 0    nitrofurantoin, macrocrystal-monohydrate, (MACROBID) 100 MG capsule Take 1 capsule (100 mg total) by mouth 2 (two) times daily. Qty: 12 capsule, Refills: 0      CONTINUE these medications which have NOT CHANGED   Details  albuterol (VENTOLIN HFA) 108 (90 Base) MCG/ACT inhaler Inhale 2 puffs into the lungs every 6 (six) hours as needed for wheezing or shortness of breath. Qty: 1 Inhaler, Refills: 11    ARIPiprazole (ABILIFY) 30 MG tablet Take 30  mg by mouth daily.    budesonide-formoterol (SYMBICORT) 160-4.5 MCG/ACT inhaler Inhale 2 puffs into the lungs 2 (two) times daily.    buPROPion (WELLBUTRIN SR) 150 MG 12 hr tablet Take 150 mg by mouth daily.    clonazePAM (KLONOPIN) 0.5 MG tablet Take 0.5 mg by mouth 2 (two) times daily.    dicyclomine (BENTYL) 10 MG capsule Take 10 mg by mouth 3 (three) times daily.     ferrous sulfate 325 (65 FE) MG tablet Take 325 mg by mouth 2 (two) times daily.     furosemide (LASIX) 80 MG tablet Take 80 mg by mouth daily.    gabapentin (NEURONTIN) 600 MG tablet Take 600 mg by mouth 2 (two) times daily.     insulin glargine (LANTUS) 100 UNIT/ML injection Inject 20 Units into the skin at bedtime.    insulin lispro (HUMALOG) 100 UNIT/ML injection Inject 0-9 Units into the skin 3 (three) times daily before meals. Pt uses as needed per sliding scale:    150-200:  0 units  201-250:  5 units  251-300:  8 units 301-350:  9 units  Greater than 350:  Call MD    lactulose (CHRONULAC) 10 GM/15ML solution Take 10 g by mouth daily.    lamoTRIgine (LAMICTAL) 100 MG tablet Take 100 mg by mouth 2 (two) times daily.    loperamide (IMODIUM) 2 MG capsule Take 4 mg by mouth as needed for diarrhea or loose stools. *Do not exceed 8 doses in 24 hours*    LORazepam (ATIVAN) 0.5 MG tablet Take 0.5 mg by mouth daily as needed for anxiety.     metFORMIN (GLUCOPHAGE) 500 MG tablet Take 500 mg by mouth daily. Take 2 tablets (1000 mg) by mouth every morning with meal, and take 1 tablet by mouth every evening at 5 pm with meal.    metoCLOPramide (REGLAN) 5 MG tablet Take 5 mg by mouth 4 (four) times daily. Before meals and at bedtime    mirtazapine (REMERON) 15 MG tablet Take 15 mg by mouth at bedtime.    omeprazole (PRILOSEC) 40 MG capsule Take 40 mg by mouth daily before breakfast.    oxybutynin (DITROPAN-XL) 5 MG 24 hr tablet Take 5 mg by mouth daily.    oxyCODONE (OXY IR/ROXICODONE) 5 MG immediate release  tablet Take 5 mg by mouth every 12 (twelve) hours as needed for severe pain.     pioglitazone (ACTOS) 30 MG tablet Take 30 mg by mouth daily.    potassium chloride SA (K-DUR,KLOR-CON) 20 MEQ tablet Take 20 mEq by mouth daily. *Note dose*    pregabalin (LYRICA) 150 MG capsule Take 150 mg by mouth 3 (three) times daily.     rOPINIRole (REQUIP) 3 MG tablet Take 3 mg by mouth at bedtime.    senna (SENOKOT) 8.6 MG tablet Take 1 tablet by mouth 3 (three) times a week. Take on Monday, Wednesday, and Friday. *Hold for loose stools*  simvastatin (ZOCOR) 10 MG tablet Take 10 mg by mouth at bedtime.     spironolactone (ALDACTONE) 25 MG tablet Take 25 mg by mouth daily.    traMADol (ULTRAM) 50 MG tablet Take 50 mg by mouth every 6 (six) hours as needed for moderate pain.    vitamin C (ASCORBIC ACID) 500 MG tablet Take 500 mg by mouth 2 (two) times daily.    zolpidem (AMBIEN) 5 MG tablet Take 5 mg by mouth at bedtime.     acetaminophen (TYLENOL) 325 MG tablet Take 650 mg by mouth every 6 (six) hours as needed.    Multiple Vitamin (MULTIVITAMIN WITH MINERALS) TABS tablet Take 1 tablet by mouth daily with breakfast.               Today   CHIEF COMPLAINT:   Patient doing well this am  Ready for discharge   VITAL SIGNS:  Blood pressure (!) 118/42, pulse 81, temperature 97.4 F (36.3 C), temperature source Oral, resp. rate 16, height 5' (1.524 m), weight 101.7 kg (224 lb 3.2 oz), SpO2 98 %.   REVIEW OF SYSTEMS:  Review of Systems  Constitutional: Negative.  Negative for chills, fever and malaise/fatigue.  HENT: Negative.  Negative for ear discharge, ear pain, hearing loss, nosebleeds and sore throat.   Eyes: Negative.  Negative for blurred vision and pain.  Respiratory: Negative.  Negative for cough, hemoptysis, shortness of breath and wheezing.   Cardiovascular: Negative.  Negative for chest pain, palpitations and leg swelling.  Gastrointestinal: Negative.  Negative for  abdominal pain, blood in stool, diarrhea, nausea and vomiting.  Genitourinary: Negative.  Negative for dysuria.  Musculoskeletal: Negative.  Negative for back pain.  Skin: Negative.   Neurological: Negative for dizziness, tremors, speech change, focal weakness, seizures and headaches.  Endo/Heme/Allergies: Negative.  Does not bruise/bleed easily.  Psychiatric/Behavioral: Negative.  Negative for depression, hallucinations and suicidal ideas.     PHYSICAL EXAMINATION:  GENERAL:  64 y.o.-year-old patient lying in the bed with no acute distress.  NECK:  Supple, no jugular venous distention. No thyroid enlargement, no tenderness.  LUNGS: Normal breath sounds bilaterally, no wheezing, rales,rhonchi  No use of accessory muscles of respiration.  CARDIOVASCULAR: S1, S2 normal. No murmurs, rubs, or gallops.  ABDOMEN: Soft, non-tender, non-distended. Bowel sounds present. No organomegaly or mass.  EXTREMITIES: No pedal edema, cyanosis, or clubbing.  PSYCHIATRIC: The patient is alert and oriented x 3.  SKIN: No obvious rash, lesion, or ulcer.   DATA REVIEW:   CBC  Recent Labs Lab 04/20/16 0420  WBC 4.8  HGB 11.2*  HCT 33.4*  PLT 105*    Chemistries   Recent Labs Lab 04/18/16 0906  04/20/16 0420  NA 141  < > 142  K 3.7  < > 4.2  CL 101  < > 110  CO2 32  < > 28  GLUCOSE 151*  < > 73  BUN 12  < > 11  CREATININE 0.83  < > 0.67  CALCIUM 9.3  < > 8.5*  AST 32  --   --   ALT 20  --   --   ALKPHOS 130*  --   --   BILITOT 1.3*  --   --   < > = values in this interval not displayed.  Cardiac Enzymes  Recent Labs Lab 04/18/16 0906  TROPONINI <0.03    Microbiology Results  @[email or web address] @  RADIOLOGY:  No results found.    Management plans discussed with the patient and  she is in agreement. Stable for discharge AL patient did not want to go to SNF as REC by PT  Patient should follow up with pcp  CODE STATUS:     Code Status Orders        Start     Ordered    04/18/16 1312  Full code  Continuous     04/18/16 1311    Code Status History    Date Active Date Inactive Code Status Order ID Comments User Context   04/04/2016  3:31 PM 04/05/2016  9:38 PM Full Code [id number]  Loletha Grayer, MD ED   09/10/2015  2:04 PM 09/11/2015  6:33 PM Full Code [id number]  Yves Dill, RN Inpatient   09/09/2015 11:14 PM 09/10/2015  2:04 PM DNR [id number]  Demetrios Loll, MD ED    Advance Directive Documentation   Flowsheet Row Most Recent Value  Type of Advance Directive  Healthcare Power of Attorney  Pre-existing out of facility DNR order (yellow form or pink MOST form)  No data  "MOST" Form in Place?  No data      TOTAL TIME TAKING CARE OF THIS PATIENT: 38 minutes.    Note: This dictation was prepared with Dragon dictation along with smaller phrase technology. Any transcriptional errors that result from this process are unintentional.  Tylicia Sherman M.D on 04/21/2016 at 7:51 AM  Between 7am to 6pm - Pager - [phone] After 6pm go to [email or web address] - password [email or web address] [facility]  Office  [phone]  CC: Primary care physician; Sarajane Jews, MD

## 2016-04-21 NOTE — Progress Notes (Signed)
Packet given to pt's family to give to [facility] staff.  CSW updated facility and pt ready to discharge.  IV removed and pt transported to [facility] via car by her family.  Clarise Cruz, RN

## 2016-04-23 LAB — CULTURE, BLOOD (ROUTINE X 2)
CULTURE: NO GROWTH
Culture: NO GROWTH

## 2016-04-25 DIAGNOSIS — M79673 Pain in unspecified foot: Secondary | ICD-10-CM | POA: Diagnosis not present

## 2016-04-25 DIAGNOSIS — B351 Tinea unguium: Secondary | ICD-10-CM | POA: Diagnosis not present

## 2016-04-25 DIAGNOSIS — B070 Plantar wart: Secondary | ICD-10-CM | POA: Diagnosis not present

## 2016-04-25 DIAGNOSIS — L851 Acquired keratosis [keratoderma] palmaris et plantaris: Secondary | ICD-10-CM | POA: Diagnosis not present

## 2016-04-26 DIAGNOSIS — I10 Essential (primary) hypertension: Secondary | ICD-10-CM | POA: Diagnosis not present

## 2016-04-26 DIAGNOSIS — Z794 Long term (current) use of insulin: Secondary | ICD-10-CM | POA: Diagnosis not present

## 2016-04-26 DIAGNOSIS — N390 Urinary tract infection, site not specified: Secondary | ICD-10-CM | POA: Diagnosis not present

## 2016-04-26 DIAGNOSIS — L03115 Cellulitis of right lower limb: Secondary | ICD-10-CM | POA: Diagnosis not present

## 2016-04-26 DIAGNOSIS — J189 Pneumonia, unspecified organism: Secondary | ICD-10-CM | POA: Diagnosis not present

## 2016-04-26 DIAGNOSIS — D649 Anemia, unspecified: Secondary | ICD-10-CM | POA: Diagnosis not present

## 2016-04-26 DIAGNOSIS — E119 Type 2 diabetes mellitus without complications: Secondary | ICD-10-CM | POA: Diagnosis not present

## 2016-04-26 DIAGNOSIS — J440 Chronic obstructive pulmonary disease with acute lower respiratory infection: Secondary | ICD-10-CM | POA: Diagnosis not present

## 2016-04-26 DIAGNOSIS — Z7951 Long term (current) use of inhaled steroids: Secondary | ICD-10-CM | POA: Diagnosis not present

## 2016-04-26 DIAGNOSIS — B9620 Unspecified Escherichia coli [E. coli] as the cause of diseases classified elsewhere: Secondary | ICD-10-CM | POA: Diagnosis not present

## 2016-04-26 DIAGNOSIS — E785 Hyperlipidemia, unspecified: Secondary | ICD-10-CM | POA: Diagnosis not present

## 2016-04-26 DIAGNOSIS — K7581 Nonalcoholic steatohepatitis (NASH): Secondary | ICD-10-CM | POA: Diagnosis not present

## 2016-04-29 DIAGNOSIS — J189 Pneumonia, unspecified organism: Secondary | ICD-10-CM | POA: Diagnosis not present

## 2016-04-29 DIAGNOSIS — E119 Type 2 diabetes mellitus without complications: Secondary | ICD-10-CM | POA: Diagnosis not present

## 2016-04-29 DIAGNOSIS — B9620 Unspecified Escherichia coli [E. coli] as the cause of diseases classified elsewhere: Secondary | ICD-10-CM | POA: Diagnosis not present

## 2016-04-29 DIAGNOSIS — N390 Urinary tract infection, site not specified: Secondary | ICD-10-CM | POA: Diagnosis not present

## 2016-04-29 DIAGNOSIS — E785 Hyperlipidemia, unspecified: Secondary | ICD-10-CM | POA: Diagnosis not present

## 2016-04-29 DIAGNOSIS — L03115 Cellulitis of right lower limb: Secondary | ICD-10-CM | POA: Diagnosis not present

## 2016-04-29 DIAGNOSIS — D649 Anemia, unspecified: Secondary | ICD-10-CM | POA: Diagnosis not present

## 2016-04-29 DIAGNOSIS — Z794 Long term (current) use of insulin: Secondary | ICD-10-CM | POA: Diagnosis not present

## 2016-04-29 DIAGNOSIS — Z7951 Long term (current) use of inhaled steroids: Secondary | ICD-10-CM | POA: Diagnosis not present

## 2016-04-29 DIAGNOSIS — I10 Essential (primary) hypertension: Secondary | ICD-10-CM | POA: Diagnosis not present

## 2016-04-29 DIAGNOSIS — J440 Chronic obstructive pulmonary disease with acute lower respiratory infection: Secondary | ICD-10-CM | POA: Diagnosis not present

## 2016-04-29 DIAGNOSIS — K7581 Nonalcoholic steatohepatitis (NASH): Secondary | ICD-10-CM | POA: Diagnosis not present

## 2016-04-29 IMAGING — DX DG CHEST 1V PORT
1 series · 1 of 1 positions shown · non-contrast
Comparison: CT chest abdomen and pelvis 09/01/2015 and earlier.

CLINICAL DATA: 65-year-old female with chest pain and lethargy.
Initial encounter.

EXAM:
PORTABLE CHEST 1 VIEW

[chest ap]
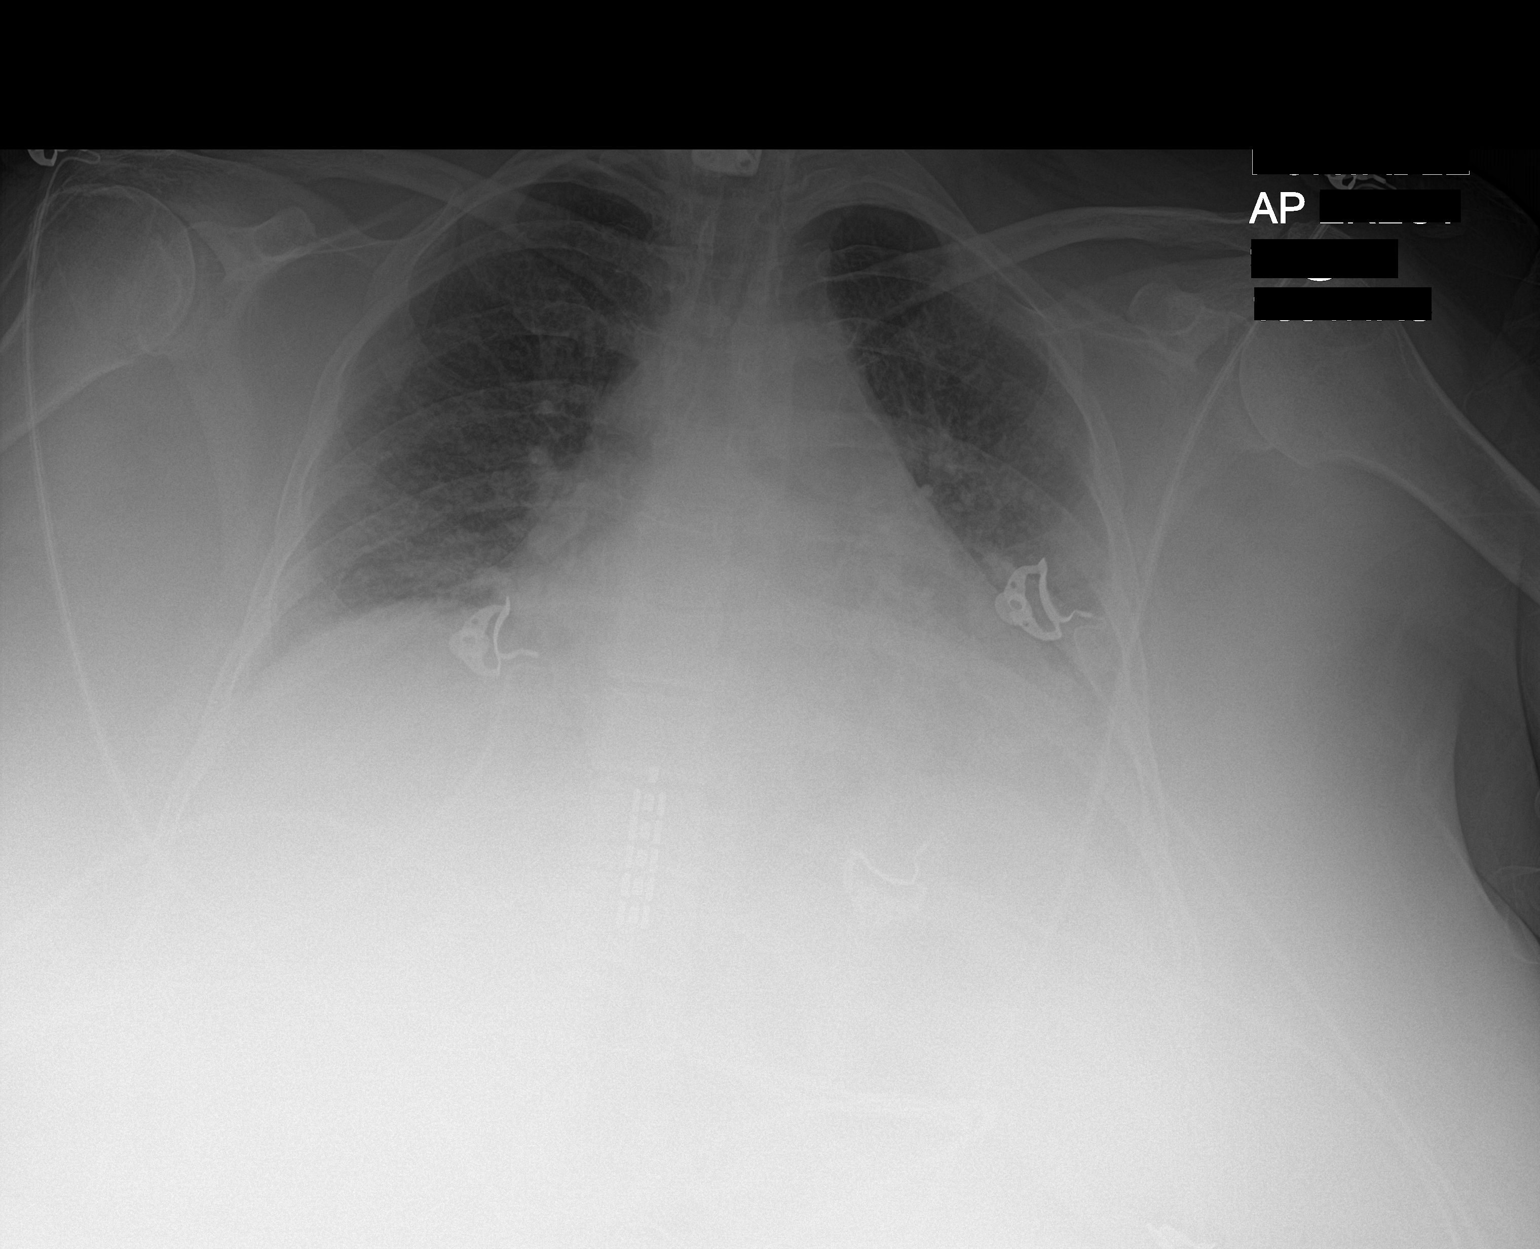

[1 of 1 positions shown; findings below may reference images not displayed]

FINDINGS: Portable AP upright view at 6366 hours. Lordotic view. Chronic lower
thoracic spinal stimulator and lower cervical ACDF hardware. Stable
cardiac size and mediastinal contours. No pneumothorax, pleural
effusion or consolidation identified portably. Increased pulmonary
vascular congestion, no overt edema suspected at this time.
Visualized tracheal air column is within normal limits.
IMPRESSION: Increased pulmonary vascularity without overt edema.

## 2016-05-03 DIAGNOSIS — R269 Unspecified abnormalities of gait and mobility: Secondary | ICD-10-CM | POA: Diagnosis not present

## 2016-05-03 DIAGNOSIS — E119 Type 2 diabetes mellitus without complications: Secondary | ICD-10-CM | POA: Diagnosis not present

## 2016-05-03 DIAGNOSIS — I10 Essential (primary) hypertension: Secondary | ICD-10-CM | POA: Diagnosis not present

## 2016-05-03 DIAGNOSIS — R600 Localized edema: Secondary | ICD-10-CM | POA: Diagnosis not present

## 2016-05-04 DIAGNOSIS — E785 Hyperlipidemia, unspecified: Secondary | ICD-10-CM | POA: Diagnosis not present

## 2016-05-04 DIAGNOSIS — J189 Pneumonia, unspecified organism: Secondary | ICD-10-CM | POA: Diagnosis not present

## 2016-05-04 DIAGNOSIS — Z7951 Long term (current) use of inhaled steroids: Secondary | ICD-10-CM | POA: Diagnosis not present

## 2016-05-04 DIAGNOSIS — B9620 Unspecified Escherichia coli [E. coli] as the cause of diseases classified elsewhere: Secondary | ICD-10-CM | POA: Diagnosis not present

## 2016-05-04 DIAGNOSIS — L03115 Cellulitis of right lower limb: Secondary | ICD-10-CM | POA: Diagnosis not present

## 2016-05-04 DIAGNOSIS — N390 Urinary tract infection, site not specified: Secondary | ICD-10-CM | POA: Diagnosis not present

## 2016-05-04 DIAGNOSIS — J440 Chronic obstructive pulmonary disease with acute lower respiratory infection: Secondary | ICD-10-CM | POA: Diagnosis not present

## 2016-05-04 DIAGNOSIS — D649 Anemia, unspecified: Secondary | ICD-10-CM | POA: Diagnosis not present

## 2016-05-04 DIAGNOSIS — Z794 Long term (current) use of insulin: Secondary | ICD-10-CM | POA: Diagnosis not present

## 2016-05-04 DIAGNOSIS — E119 Type 2 diabetes mellitus without complications: Secondary | ICD-10-CM | POA: Diagnosis not present

## 2016-05-04 DIAGNOSIS — K7581 Nonalcoholic steatohepatitis (NASH): Secondary | ICD-10-CM | POA: Diagnosis not present

## 2016-05-04 DIAGNOSIS — I10 Essential (primary) hypertension: Secondary | ICD-10-CM | POA: Diagnosis not present

## 2016-05-06 ENCOUNTER — Ambulatory Visit (INDEPENDENT_AMBULATORY_CARE_PROVIDER_SITE_OTHER): Payer: Medicare Other | Admitting: Vascular Surgery

## 2016-05-06 ENCOUNTER — Encounter (INDEPENDENT_AMBULATORY_CARE_PROVIDER_SITE_OTHER): Payer: Self-pay | Admitting: Vascular Surgery

## 2016-05-06 VITALS — BP 123/64 | HR 83 | Resp 16 | Ht 62.0 in | Wt 224.0 lb

## 2016-05-06 DIAGNOSIS — M7989 Other specified soft tissue disorders: Secondary | ICD-10-CM | POA: Diagnosis not present

## 2016-05-06 DIAGNOSIS — M79605 Pain in left leg: Secondary | ICD-10-CM | POA: Diagnosis not present

## 2016-05-06 DIAGNOSIS — I87029 Postthrombotic syndrome with inflammation of unspecified lower extremity: Secondary | ICD-10-CM | POA: Diagnosis not present

## 2016-05-06 DIAGNOSIS — M79609 Pain in unspecified limb: Secondary | ICD-10-CM | POA: Insufficient documentation

## 2016-05-06 DIAGNOSIS — E1165 Type 2 diabetes mellitus with hyperglycemia: Secondary | ICD-10-CM | POA: Diagnosis not present

## 2016-05-06 DIAGNOSIS — E118 Type 2 diabetes mellitus with unspecified complications: Secondary | ICD-10-CM | POA: Diagnosis not present

## 2016-05-06 DIAGNOSIS — I10 Essential (primary) hypertension: Secondary | ICD-10-CM | POA: Diagnosis not present

## 2016-05-06 DIAGNOSIS — M79604 Pain in right leg: Secondary | ICD-10-CM

## 2016-05-06 NOTE — Assessment & Plan Note (Signed)
blood glucose control important in reducing the progression of atherosclerotic disease. Also, involved in wound healing. On appropriate medications.  

## 2016-05-06 NOTE — Assessment & Plan Note (Addendum)
The patient has what sounds like some neuropathy of both lower extremities, worse on the right which I suspect is a component of postphlebitic syndrome in addition to neuropathy. Given the symptoms, I have recommended leg elevation, compression stockings, and a venous reflux study to be performed at her convenience on the right leg. I have recommended anti-inflammatories as needed for the discomfort. She has strongly palpable pulses so I do not think this is arterial insufficiency

## 2016-05-06 NOTE — Assessment & Plan Note (Signed)
blood pressure control important in reducing the progression of atherosclerotic disease. On appropriate oral medications.  

## 2016-05-06 NOTE — Progress Notes (Signed)
Patient ID: Stephanie [name], female   DOB: 1951/01/17, 65 y.o.   MRN: [id number]  Chief Complaint  Patient presents with  . New Evaluation    Possible PAD/PVD    HPI Stephanie [name] is a 65 y.o. female.  I am asked to see the patient by Dr. Fredderick Phenix for evaluation of pain and swelling in her legs.  The patient reports long-standing history of numbness, tingling, and discoloration in her feet and lower legs. There was no clear inciting event or causative factor that started the symptoms. This has been going on for years but has been gradually worsening. She has noticed increasing swelling and dry skin on the right leg. She reports a remote history of DVT in the right leg some years ago. She does not have ulceration. She does have a previous history of cellulitis in that leg as well. The left leg does not swell nearly as bad as the right, and only has mild swelling. She denies chest pain or shortness of breath. She denies fever or chills.   Past Medical History:  Diagnosis Date  . Adenomatous colon polyp 08/27/2014   Polyps x 3  . Anemia   . Anxiety   . 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 Arthritis   . Asthma   . Bipolar affective disorder ([place])    h/o  . Blood transfusion 2000  . Blood transfusion without reported diagnosis   . Chronic abdominal pain    abdominal wall pain  . 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 Colon polyp   . Depression   . Diabetes mellitus without complication ([place])   . Fatty liver 04/09/08   found in abd CT  . GERD (gastroesophageal reflux disease)   . H/O: CVA (cardiovascular accident)    TIA  . H/O: rheumatic fever   . Hyperlipidemia   . Incontinence   . Insomnia   . Obstructive sleep apnea    not using CPAP, last sleep study 2.5 years ago, per pt doctor is aware  . Platelets decreased ([place])   . RLS (restless legs syndrome)   . Shortness of breath   . Sleep apnea   . Stroke [facility])    TIA- 2002  . Ulcer ([place])   . Unspecified disorders of  nervous system     Past Surgical History:  Procedure Laterality Date  . ABDOMINAL EXPLORATION SURGERY    . ABDOMINAL HYSTERECTOMY     total  . Axillary artery cannulation via 8-mm Hemashield graft, median sternotomy, extracorporeal circulation with deep hypothermic circulatory arrest, repair of  aortic dessection  01/23/2009   Dr Arlyce Dice  . BACK SURGERY     x 45  . cataract     both eyes  . CHOLECYSTECTOMY    . COLONOSCOPY    . EYE SURGERY     bilat  . HEMORROIDECTOMY     and colon polyp removed  . LIVER BIOPSY  04/10/2012   Procedure: LIVER BIOPSY;  Surgeon: Inda Castle, MD;  Location: [facility];  Service: Endoscopy;  Laterality: N/A;  ultrasound to mark order for abd limited/liver to be marked to be sent by [email or web address]   . LUMBAR FUSION  10/09  . LUMBAR WOUND DEBRIDEMENT  05/09/2011   Procedure: LUMBAR WOUND DEBRIDEMENT;  Surgeon: Dahlia Bailiff;  Location: [place];  Service: Orthopedics;  Laterality: N/A;  IRRIGATION AND DEBRIDEMENT SPINAL WOUND  . POSTERIOR CERVICAL FUSION/FORAMINOTOMY    . ROTATOR  CUFF REPAIR     left    Family History  Problem Relation Age of Onset  . Breast cancer Mother   . Lung cancer Mother   . Stomach cancer Father   . Diabetes      4 aunts, and 1 uncle  . Colon cancer Neg Hx   . Esophageal cancer Neg Hx   . Rectal cancer Neg Hx      Social History Social History  Substance Use Topics  . Smoking status: Former Smoker    Packs/day: 1.50    Years: 50.00    Types: Cigarettes    Quit date: 04/06/2013  . Smokeless tobacco: Never Used  . Alcohol use No  Lives in a facility  Allergies  Allergen Reactions  . Doxycycline Hives, Swelling and Other (See Comments)    Reaction:  Facial swelling     Current Outpatient Prescriptions  Medication Sig Dispense Refill  . acetaminophen (TYLENOL) 325 MG tablet Take 650 mg by mouth every 6 (six) hours as needed.    Marland Kitchen albuterol (VENTOLIN HFA) 108 (90 Base) MCG/ACT inhaler Inhale 2 puffs into the  lungs every 6 (six) hours as needed for wheezing or shortness of breath. 1 Inhaler 11  . ARIPiprazole (ABILIFY) 30 MG tablet Take 30 mg by mouth daily.    . budesonide-formoterol (SYMBICORT) 160-4.5 MCG/ACT inhaler Inhale 2 puffs into the lungs 2 (two) times daily.    Marland Kitchen buPROPion (WELLBUTRIN SR) 150 MG 12 hr tablet Take 150 mg by mouth daily.    . clonazePAM (KLONOPIN) 0.5 MG tablet Take 0.5 mg by mouth 2 (two) times daily.    Marland Kitchen dicyclomine (BENTYL) 10 MG capsule Take 10 mg by mouth 3 (three) times daily.     . ferrous sulfate 325 (65 FE) MG tablet Take 325 mg by mouth 2 (two) times daily.     . furosemide (LASIX) 80 MG tablet Take 80 mg by mouth daily.    Marland Kitchen gabapentin (NEURONTIN) 600 MG tablet Take 600 mg by mouth 2 (two) times daily.     . insulin glargine (LANTUS) 100 UNIT/ML injection Inject 20 Units into the skin at bedtime.    . insulin lispro (HUMALOG) 100 UNIT/ML injection Inject 0-9 Units into the skin 3 (three) times daily before meals. Pt uses as needed per sliding scale:    150-200:  0 units  201-250:  5 units  251-300:  8 units 301-350:  9 units  Greater than 350:  Call MD    . lactulose (CHRONULAC) 10 GM/15ML solution Take 10 g by mouth daily.    Marland Kitchen lamoTRIgine (LAMICTAL) 100 MG tablet Take 100 mg by mouth 2 (two) times daily.    Marland Kitchen loperamide (IMODIUM) 2 MG capsule Take 4 mg by mouth as needed for diarrhea or loose stools. *Do not exceed 8 doses in 24 hours*    . LORazepam (ATIVAN) 0.5 MG tablet Take 0.5 mg by mouth daily as needed for anxiety.     . metFORMIN (GLUCOPHAGE) 500 MG tablet Take 500 mg by mouth daily. Take 2 tablets (1000 mg) by mouth every morning with meal, and take 1 tablet by mouth every evening at 5 pm with meal.    . metoCLOPramide (REGLAN) 5 MG tablet Take 5 mg by mouth 4 (four) times daily. Before meals and at bedtime    . mirtazapine (REMERON) 15 MG tablet Take 15 mg by mouth at bedtime.    . Multiple Vitamin (MULTIVITAMIN WITH MINERALS) TABS tablet Take 1  tablet by mouth daily with breakfast.     . omeprazole (PRILOSEC) 40 MG capsule Take 40 mg by mouth daily before breakfast.    . oxybutynin (DITROPAN-XL) 5 MG 24 hr tablet Take 5 mg by mouth daily.    Marland Kitchen oxyCODONE (OXY IR/ROXICODONE) 5 MG immediate release tablet Take 5 mg by mouth every 12 (twelve) hours as needed for severe pain.     . pioglitazone (ACTOS) 30 MG tablet Take 30 mg by mouth daily.    . potassium chloride SA (K-DUR,KLOR-CON) 20 MEQ tablet Take 20 mEq by mouth daily. *Note dose*    . pregabalin (LYRICA) 150 MG capsule Take 150 mg by mouth 3 (three) times daily.     Marland Kitchen rOPINIRole (REQUIP) 3 MG tablet Take 3 mg by mouth at bedtime.    . senna (SENOKOT) 8.6 MG tablet Take 1 tablet by mouth 3 (three) times a week. Take on Monday, Wednesday, and Friday. *Hold for loose stools*    . simvastatin (ZOCOR) 10 MG tablet Take 10 mg by mouth at bedtime.     Marland Kitchen spironolactone (ALDACTONE) 25 MG tablet Take 25 mg by mouth daily.    . traMADol (ULTRAM) 50 MG tablet Take 50 mg by mouth every 6 (six) hours as needed for moderate pain.    . vitamin C (ASCORBIC ACID) 500 MG tablet Take 500 mg by mouth 2 (two) times daily.    Marland Kitchen zolpidem (AMBIEN) 5 MG tablet Take 5 mg by mouth at bedtime.      No current facility-administered medications for this visit.       REVIEW OF SYSTEMS (Negative unless checked)  Constitutional: [] Weight loss  [] Fever  [] Chills Cardiac: [] Chest pain   [] Chest pressure   [x] Palpitations   [] Shortness of breath when laying flat   [] Shortness of breath at rest   [] Shortness of breath with exertion. Vascular:  [] Pain in legs with walking   [x] Pain in legs at rest   [] Pain in legs when laying flat   [] Claudication   [] Pain in feet when walking  [] Pain in feet at rest  [] Pain in feet when laying flat   [x] History of DVT   [x] Phlebitis   [x] Swelling in legs   [] Varicose veins   [] Non-healing ulcers Pulmonary:   [] Uses home oxygen   [] Productive cough   [] Hemoptysis   [] Wheeze  [] COPD    [] Asthma Neurologic:  [] Dizziness  [] Blackouts   [] Seizures   [] History of stroke   [] History of TIA  [] Aphasia   [] Temporary blindness   [] Dysphagia   [] Weakness or numbness in arms   [] Weakness or numbness in legs Musculoskeletal:  [x] Arthritis   [] Joint swelling   [x] Joint pain   [] Low back pain Hematologic:  [] Easy bruising  [] Easy bleeding   [] Hypercoagulable state   [] Anemic  [] Hepatitis Gastrointestinal:  [] Blood in stool   [] Vomiting blood  [] Gastroesophageal reflux/heartburn   [] Abdominal pain Genitourinary:  [] Chronic kidney disease   [] Difficult urination  [] Frequent urination  [] Burning with urination   [] Hematuria Skin:  [] Rashes   [] Ulcers   [] Wounds Psychological:  [x] History of anxiety   []  History of major depression.    Physical Exam BP 123/64 (BP Location: Right Arm)   Pulse 83   Resp 16   Ht 5' 2"  (1.575 m)   Wt 101.6 kg (224 lb)   BMI 40.97 kg/m  Gen:  WD/WN, NAD Head: [place]/AT, No temporalis wasting. Prominent temp pulse not noted. Ear/Nose/Throat: Hearing grossly intact, nares w/o erythema or drainage,  oropharynx w/o Erythema/Exudate Eyes: Conjunctiva clear, sclera non-icteric  Neck: trachea midline.  No JVD.  Pulmonary:  Good air movement, respirations nonlabored and no use of accessory muscles.  Cardiac: RRR, normal S1, S2 Vascular:  Vessel Right Left  Radial Palpable Palpable  Ulnar Palpable Palpable  Brachial Palpable Palpable  Carotid Palpable, without bruit Palpable, without bruit  Aorta Not palpable N/A  Femoral Palpable Palpable  Popliteal Palpable Palpable  PT Palpable 1+ Palpable  DP Palpable Palpable   Gastrointestinal: soft, non-tender/non-distended. No guarding/reflex. No masses, surgical incisions, or scars. Musculoskeletal: M/S 5/5 throughout.  Extremities without ischemic changes.  No deformity or atrophy. 2+ right lower extremity edema and no appreciable left lower extremity edema. Stasis dermatitis changes are present in the right leg and  her mild to moderate Neurologic: Sensation grossly intact in extremities.  Symmetrical.  Speech is fluent. Motor exam as listed above. Psychiatric: Judgment intact, affect is somewhat flat Dermatologic: No rashes or ulcers noted.  No cellulitis or open wounds. Lymph : No Cervical, Axillary, or Inguinal lymphadenopathy.   Radiology Dg Chest 2 View  Result Date: 04/18/2016 CLINICAL DATA:  65 year old female with nonproductive cough for 4 days. Chest pain. Fever and hypoxia. Initial encounter. EXAM: CHEST  2 VIEW COMPARISON:  Chest radiographs and chest CT 04/04/2016 and earlier. FINDINGS: Large body habitus. Cervical ACDF hardware. Lower thoracic spinal stimulator device again noted. Stable lung volumes. On the frontal view there is decreased visualization of the right hemidiaphragm, but this is not correlated on the lateral view. No pneumothorax. Stable pulmonary vascularity without overt edema. No pleural effusion. Stable cardiac size and mediastinal contours. No acute osseous abnormality identified. IMPRESSION: 1. Decreased ventilation at the anterior right lung base since 04/04/2016. This might represent atelectasis but is suspicious for bronchopneumonia in this clinical setting. No pleural effusion. 2. Otherwise stable chest. Electronically Signed   By: Genevie Ann M.D.   On: 04/18/2016 11:37   US Venous Img Lower Unilateral Right  Result Date: 04/18/2016 CLINICAL DATA:  Right leg swelling for 4 months. EXAM: RIGHT LOWER EXTREMITY VENOUS DOPPLER ULTRASOUND TECHNIQUE: Gray-scale sonography with graded compression, as well as color Doppler and duplex ultrasound were performed to evaluate the lower extremity deep venous systems from the level of the common femoral vein and including the common femoral, femoral, profunda femoral, popliteal and calf veins including the posterior tibial, peroneal and gastrocnemius veins when visible. The superficial great saphenous vein was also interrogated. Spectral  Doppler was utilized to evaluate flow at rest and with distal augmentation maneuvers in the common femoral, femoral and popliteal veins. COMPARISON:  None. FINDINGS: Contralateral Common Femoral Vein: Respiratory phasicity is normal and symmetric with the symptomatic side. No evidence of thrombus. Normal compressibility. Common Femoral Vein: No evidence of thrombus. Normal compressibility, respiratory phasicity and response to augmentation. Saphenofemoral Junction: No evidence of thrombus. Normal compressibility and flow on color Doppler imaging. Profunda Femoral Vein: No evidence of thrombus. Normal compressibility and flow on color Doppler imaging. Femoral Vein: No evidence of thrombus. Normal compressibility, respiratory phasicity and response to augmentation. Popliteal Vein: No evidence of thrombus. Normal compressibility, respiratory phasicity and response to augmentation. Calf Veins: No evidence of thrombus. Normal compressibility and flow on color Doppler imaging. Superficial Great Saphenous Vein: No evidence of thrombus. Normal compressibility and flow on color Doppler imaging. Venous Reflux:  None. Other Findings:  None. IMPRESSION: No evidence of deep venous thrombosis. Electronically Signed   By: Jerilynn Mages.  Shick M.D.   On: 04/18/2016 16:39  Labs Recent Results (from the past 2160 hour(s))  Basic metabolic panel     Status: Abnormal   Collection Time: 03/09/16  8:27 PM  Result Value Ref Range   Sodium 141 135 - 145 mmol/L   Potassium 3.5 3.5 - 5.1 mmol/L   Chloride 104 101 - 111 mmol/L   CO2 28 22 - 32 mmol/L   Glucose, Bld 256 (H) 65 - 99 mg/dL   BUN 20 6 - 20 mg/dL   Creatinine, Ser 1.07 (H) 0.44 - 1.00 mg/dL   Calcium 9.0 8.9 - 10.3 mg/dL   GFR calc non Af Amer 53 (L) >60 mL/min   GFR calc Af Amer >60 >60 mL/min    Comment: (NOTE) The eGFR has been calculated using the CKD EPI equation. This calculation has not been validated in all clinical situations. eGFR's persistently <60 mL/min  signify possible Chronic Kidney Disease.    Anion gap 9 5 - 15  CBC with Differential     Status: Abnormal   Collection Time: 03/09/16  8:27 PM  Result Value Ref Range   WBC 6.0 3.6 - 11.0 K/uL   RBC 3.96 3.80 - 5.20 MIL/uL   Hemoglobin 12.2 12.0 - 16.0 g/dL   HCT 36.6 35.0 - 47.0 %   MCV 92.5 80.0 - 100.0 fL   MCH 30.7 26.0 - 34.0 pg   MCHC 33.2 32.0 - 36.0 g/dL   RDW 16.6 (H) 11.5 - 14.5 %   Platelets 95 (L) 150 - 440 K/uL   Neutrophils Relative % 72 %   Neutro Abs 4.3 1.4 - 6.5 K/uL   Lymphocytes Relative 18 %   Lymphs Abs 1.1 1.0 - 3.6 K/uL   Monocytes Relative 8 %   Monocytes Absolute 0.5 0.2 - 0.9 K/uL   Eosinophils Relative 1 %   Eosinophils Absolute 0.1 0 - 0.7 K/uL   Basophils Relative 1 %   Basophils Absolute 0.0 0 - 0.1 K/uL  Basic metabolic panel     Status: Abnormal   Collection Time: 04/04/16  9:29 AM  Result Value Ref Range   Sodium 139 135 - 145 mmol/L   Potassium 3.1 (L) 3.5 - 5.1 mmol/L   Chloride 100 (L) 101 - 111 mmol/L   CO2 30 22 - 32 mmol/L   Glucose, Bld 160 (H) 65 - 99 mg/dL   BUN 22 (H) 6 - 20 mg/dL   Creatinine, Ser 1.05 (H) 0.44 - 1.00 mg/dL   Calcium 9.2 8.9 - 10.3 mg/dL   GFR calc non Af Amer 55 (L) >60 mL/min   GFR calc Af Amer >60 >60 mL/min    Comment: (NOTE) The eGFR has been calculated using the CKD EPI equation. This calculation has not been validated in all clinical situations. eGFR's persistently <60 mL/min signify possible Chronic Kidney Disease.    Anion gap 9 5 - 15  CBC     Status: Abnormal   Collection Time: 04/04/16  9:29 AM  Result Value Ref Range   WBC 7.6 3.6 - 11.0 K/uL   RBC 3.99 3.80 - 5.20 MIL/uL   Hemoglobin 12.5 12.0 - 16.0 g/dL   HCT 36.8 35.0 - 47.0 %   MCV 92.1 80.0 - 100.0 fL   MCH 31.4 26.0 - 34.0 pg   MCHC 34.1 32.0 - 36.0 g/dL   RDW 16.4 (H) 11.5 - 14.5 %   Platelets 76 (L) 150 - 440 K/uL  Troponin I     Status: None   Collection  Time: 04/04/16  9:29 AM  Result Value Ref Range   Troponin I <0.03  <0.03 ng/mL  Urinalysis complete, with microscopic ([facility] only)     Status: Abnormal   Collection Time: 04/04/16  9:29 AM  Result Value Ref Range   Color, Urine YELLOW (A) YELLOW   APPearance HAZY (A) CLEAR   Glucose, UA NEGATIVE NEGATIVE mg/dL   Bilirubin Urine NEGATIVE NEGATIVE   Ketones, ur NEGATIVE NEGATIVE mg/dL   Specific Gravity, Urine 1.013 1.005 - 1.030   Hgb urine dipstick NEGATIVE NEGATIVE   pH 6.0 5.0 - 8.0   Protein, ur NEGATIVE NEGATIVE mg/dL   Nitrite POSITIVE (A) NEGATIVE   Leukocytes, UA 2+ (A) NEGATIVE   RBC / HPF 0-5 0 - 5 RBC/hpf   WBC, UA TOO NUMEROUS TO COUNT 0 - 5 WBC/hpf   Bacteria, UA RARE (A) NONE SEEN   Squamous Epithelial / LPF 0-5 (A) NONE SEEN   Mucous PRESENT   Urine culture     Status: Abnormal   Collection Time: 04/04/16  9:29 AM  Result Value Ref Range   Specimen Description URINE, CLEAN CATCH    Special Requests NONE    Culture (A)     >=100,000 COLONIES/mL ESCHERICHIA COLI Confirmed Extended Spectrum Beta-Lactamase Producer (ESBL) Performed at [facility]    Report Status 04/07/2016 FINAL    Organism ID, Bacteria ESCHERICHIA COLI (A)       Susceptibility   Escherichia coli - MIC*    AMPICILLIN >=32 RESISTANT Resistant     CEFAZOLIN >=64 RESISTANT Resistant     CEFTRIAXONE >=64 RESISTANT Resistant     CIPROFLOXACIN >=4 RESISTANT Resistant     GENTAMICIN <=1 SENSITIVE Sensitive     IMIPENEM <=0.25 SENSITIVE Sensitive     NITROFURANTOIN 32 SENSITIVE Sensitive     TRIMETH/SULFA >=320 RESISTANT Resistant     AMPICILLIN/SULBACTAM 4 SENSITIVE Sensitive     PIP/TAZO <=4 SENSITIVE Sensitive     Extended ESBL POSITIVE Resistant     * >=100,000 COLONIES/mL ESCHERICHIA COLI  Troponin I     Status: None   Collection Time: 04/04/16  4:33 PM  Result Value Ref Range   Troponin I <0.03 <0.03 ng/mL  CBC     Status: Abnormal   Collection Time: 04/04/16  4:33 PM  Result Value Ref Range   WBC 7.9 3.6 - 11.0 K/uL   RBC 4.02 3.80 - 5.20 MIL/uL    Hemoglobin 12.6 12.0 - 16.0 g/dL   HCT 37.3 35.0 - 47.0 %   MCV 93.0 80.0 - 100.0 fL   MCH 31.3 26.0 - 34.0 pg   MCHC 33.6 32.0 - 36.0 g/dL   RDW 16.3 (H) 11.5 - 14.5 %   Platelets 87 (L) 150 - 440 K/uL  Creatinine, serum     Status: Abnormal   Collection Time: 04/04/16  4:33 PM  Result Value Ref Range   Creatinine, Ser 1.06 (H) 0.44 - 1.00 mg/dL   GFR calc non Af Amer 54 (L) >60 mL/min   GFR calc Af Amer >60 >60 mL/min    Comment: (NOTE) The eGFR has been calculated using the CKD EPI equation. This calculation has not been validated in all clinical situations. eGFR's persistently <60 mL/min signify possible Chronic Kidney Disease.   Glucose, capillary     Status: None   Collection Time: 04/04/16  4:58 PM  Result Value Ref Range   Glucose-Capillary 99 65 - 99 mg/dL   Comment 1 Notify RN  Comment 2 Document in Chart   MRSA PCR Screening     Status: None   Collection Time: 04/04/16  5:21 PM  Result Value Ref Range   MRSA by PCR NEGATIVE NEGATIVE    Comment:        The GeneXpert MRSA Assay (FDA approved for NASAL specimens only), is one component of a comprehensive MRSA colonization surveillance program. It is not intended to diagnose MRSA infection nor to guide or monitor treatment for MRSA infections.   Troponin I     Status: None   Collection Time: 04/04/16  7:07 PM  Result Value Ref Range   Troponin I <0.03 <0.03 ng/mL  Glucose, capillary     Status: Abnormal   Collection Time: 04/04/16  8:57 PM  Result Value Ref Range   Glucose-Capillary 243 (H) 65 - 99 mg/dL  Basic metabolic panel     Status: Abnormal   Collection Time: 04/05/16  3:25 AM  Result Value Ref Range   Sodium 139 135 - 145 mmol/L   Potassium 3.4 (L) 3.5 - 5.1 mmol/L   Chloride 103 101 - 111 mmol/L   CO2 29 22 - 32 mmol/L   Glucose, Bld 232 (H) 65 - 99 mg/dL   BUN 22 (H) 6 - 20 mg/dL   Creatinine, Ser 0.96 0.44 - 1.00 mg/dL   Calcium 8.9 8.9 - 10.3 mg/dL   GFR calc non Af Amer >60 >60  mL/min   GFR calc Af Amer >60 >60 mL/min    Comment: (NOTE) The eGFR has been calculated using the CKD EPI equation. This calculation has not been validated in all clinical situations. eGFR's persistently <60 mL/min signify possible Chronic Kidney Disease.    Anion gap 7 5 - 15  CBC     Status: Abnormal   Collection Time: 04/05/16  3:25 AM  Result Value Ref Range   WBC 6.2 3.6 - 11.0 K/uL   RBC 4.02 3.80 - 5.20 MIL/uL   Hemoglobin 12.7 12.0 - 16.0 g/dL   HCT 37.4 35.0 - 47.0 %   MCV 92.9 80.0 - 100.0 fL   MCH 31.5 26.0 - 34.0 pg   MCHC 33.9 32.0 - 36.0 g/dL   RDW 16.6 (H) 11.5 - 14.5 %   Platelets 75 (L) 150 - 440 K/uL  Lipid panel     Status: None   Collection Time: 04/05/16  3:25 AM  Result Value Ref Range   Cholesterol 194 0 - 200 mg/dL   Triglycerides 47 <150 mg/dL   HDL 110 >40 mg/dL   Total CHOL/HDL Ratio 1.8 RATIO   VLDL 9 0 - 40 mg/dL   LDL Cholesterol 75 0 - 99 mg/dL    Comment:        Total Cholesterol/HDL:CHD Risk Coronary Heart Disease Risk Table                     Men   Women  1/2 Average Risk   3.4   3.3  Average Risk       5.0   4.4  2 X Average Risk   9.6   7.1  3 X Average Risk  23.4   11.0        Use the calculated Patient Ratio above and the CHD Risk Table to determine the patient's CHD Risk.        ATP III CLASSIFICATION (LDL):  <100     mg/dL   Optimal  100-129  mg/dL   Near  or Above                    Optimal  130-159  mg/dL   Borderline  160-189  mg/dL   High  >190     mg/dL   Very High   Magnesium     Status: None   Collection Time: 04/05/16  3:25 AM  Result Value Ref Range   Magnesium 2.4 1.7 - 2.4 mg/dL  Glucose, capillary     Status: Abnormal   Collection Time: 04/05/16  7:43 AM  Result Value Ref Range   Glucose-Capillary 174 (H) 65 - 99 mg/dL  NM Myocar Multi W/Spect W/Wall Motion / EF     Status: None (In process)   Collection Time: 04/05/16 12:38 PM  Result Value Ref Range   Rest HR 74 bpm   Rest BP 103/44 mmHg   Exercise  duration (sec) 0 sec   Percent HR 63 %   Exercise duration (min) 1 min   Estimated workload 1.0 METS   Peak HR 99 bpm   Peak BP 104/47 mmHg   MPHR 115 bpm   SSS 0    SRS 4    SDS 0    TID 0.94    LV sys vol 25 mL   LV dias vol 78 46 - 106 mL  Glucose, capillary     Status: Abnormal   Collection Time: 04/05/16  1:48 PM  Result Value Ref Range   Glucose-Capillary 124 (H) 65 - 99 mg/dL  Glucose, capillary     Status: Abnormal   Collection Time: 04/05/16  4:42 PM  Result Value Ref Range   Glucose-Capillary 242 (H) 65 - 99 mg/dL  Comprehensive metabolic panel     Status: Abnormal   Collection Time: 04/18/16  9:06 AM  Result Value Ref Range   Sodium 141 135 - 145 mmol/L   Potassium 3.7 3.5 - 5.1 mmol/L   Chloride 101 101 - 111 mmol/L   CO2 32 22 - 32 mmol/L   Glucose, Bld 151 (H) 65 - 99 mg/dL   BUN 12 6 - 20 mg/dL   Creatinine, Ser 0.83 0.44 - 1.00 mg/dL   Calcium 9.3 8.9 - 10.3 mg/dL   Total Protein 7.1 6.5 - 8.1 g/dL   Albumin 3.3 (L) 3.5 - 5.0 g/dL   AST 32 15 - 41 U/L   ALT 20 14 - 54 U/L   Alkaline Phosphatase 130 (H) 38 - 126 U/L   Total Bilirubin 1.3 (H) 0.3 - 1.2 mg/dL   GFR calc non Af Amer >60 >60 mL/min   GFR calc Af Amer >60 >60 mL/min    Comment: (NOTE) The eGFR has been calculated using the CKD EPI equation. This calculation has not been validated in all clinical situations. eGFR's persistently <60 mL/min signify possible Chronic Kidney Disease.    Anion gap 8 5 - 15  Brain natriuretic peptide     Status: Abnormal   Collection Time: 04/18/16  9:06 AM  Result Value Ref Range   B Natriuretic Peptide 143.0 (H) 0.0 - 100.0 pg/mL  CBC with Differential     Status: Abnormal   Collection Time: 04/18/16  9:06 AM  Result Value Ref Range   WBC 10.2 3.6 - 11.0 K/uL   RBC 4.17 3.80 - 5.20 MIL/uL   Hemoglobin 13.1 12.0 - 16.0 g/dL   HCT 38.8 35.0 - 47.0 %   MCV 93.0 80.0 - 100.0 fL   MCH 31.3 26.0 -  34.0 pg   MCHC 33.6 32.0 - 36.0 g/dL   RDW 16.4 (H) 11.5 -  14.5 %   Platelets 113 (L) 150 - 440 K/uL   Neutrophils Relative % 83 %   Neutro Abs 8.5 (H) 1.4 - 6.5 K/uL   Lymphocytes Relative 7 %   Lymphs Abs 0.7 (L) 1.0 - 3.6 K/uL   Monocytes Relative 9 %   Monocytes Absolute 0.9 0.2 - 0.9 K/uL   Eosinophils Relative 1 %   Eosinophils Absolute 0.1 0 - 0.7 K/uL   Basophils Relative 0 %   Basophils Absolute 0.0 0 - 0.1 K/uL  Troponin I     Status: None   Collection Time: 04/18/16  9:06 AM  Result Value Ref Range   Troponin I <0.03 <0.03 ng/mL  Culture, blood (routine x 2)     Status: None   Collection Time: 04/18/16  9:18 AM  Result Value Ref Range   Specimen Description BLOOD R ARM    Special Requests      BOTTLES DRAWN AEROBIC AND ANAEROBIC AER 9ML ANA 6ML   Culture NO GROWTH 5 DAYS    Report Status 04/23/2016 FINAL   Lactic acid, plasma     Status: None   Collection Time: 04/18/16  9:43 AM  Result Value Ref Range   Lactic Acid, Venous 1.6 0.5 - 1.9 mmol/L  Urinalysis complete, with microscopic     Status: Abnormal   Collection Time: 04/18/16  9:43 AM  Result Value Ref Range   Color, Urine YELLOW (A) YELLOW   APPearance CLEAR (A) CLEAR   Glucose, UA NEGATIVE NEGATIVE mg/dL   Bilirubin Urine NEGATIVE NEGATIVE   Ketones, ur NEGATIVE NEGATIVE mg/dL   Specific Gravity, Urine 1.008 1.005 - 1.030   Hgb urine dipstick NEGATIVE NEGATIVE   pH 8.0 5.0 - 8.0   Protein, ur NEGATIVE NEGATIVE mg/dL   Nitrite POSITIVE (A) NEGATIVE   Leukocytes, UA 3+ (A) NEGATIVE   RBC / HPF 0-5 0 - 5 RBC/hpf   WBC, UA TOO NUMEROUS TO COUNT 0 - 5 WBC/hpf   Bacteria, UA RARE (A) NONE SEEN   Squamous Epithelial / LPF 0-5 (A) NONE SEEN   Mucous PRESENT   Culture, blood (routine x 2)     Status: None   Collection Time: 04/18/16  9:50 AM  Result Value Ref Range   Specimen Description BLOOD L HAND    Special Requests BOTTLES DRAWN AEROBIC AND ANAEROBIC 9ML    Culture NO GROWTH 5 DAYS    Report Status 04/23/2016 FINAL   Hemoglobin A1c     Status: Abnormal    Collection Time: 04/18/16  2:31 PM  Result Value Ref Range   Hgb A1c MFr Bld 6.2 (H) 4.8 - 5.6 %    Comment: (NOTE)         Pre-diabetes: 5.7 - 6.4         Diabetes: >6.4         Glycemic control for adults with diabetes: <7.0    Mean Plasma Glucose 131 mg/dL    Comment: (NOTE) Performed At: [facility] [address] [place], [place] [id number] Lindon Romp MD [id number]   Glucose, capillary     Status: Abnormal   Collection Time: 04/18/16  2:46 PM  Result Value Ref Range   Glucose-Capillary 106 (H) 65 - 99 mg/dL  Glucose, capillary     Status: Abnormal   Collection Time: 04/18/16  4:46 PM  Result Value Ref Range  Glucose-Capillary 167 (H) 65 - 99 mg/dL  Glucose, capillary     Status: Abnormal   Collection Time: 04/18/16  9:18 PM  Result Value Ref Range   Glucose-Capillary 122 (H) 65 - 99 mg/dL  Basic metabolic panel     Status: Abnormal   Collection Time: 04/19/16  5:43 AM  Result Value Ref Range   Sodium 143 135 - 145 mmol/L   Potassium 3.4 (L) 3.5 - 5.1 mmol/L   Chloride 105 101 - 111 mmol/L   CO2 31 22 - 32 mmol/L   Glucose, Bld 95 65 - 99 mg/dL   BUN 13 6 - 20 mg/dL   Creatinine, Ser 0.86 0.44 - 1.00 mg/dL   Calcium 8.3 (L) 8.9 - 10.3 mg/dL   GFR calc non Af Amer >60 >60 mL/min   GFR calc Af Amer >60 >60 mL/min    Comment: (NOTE) The eGFR has been calculated using the CKD EPI equation. This calculation has not been validated in all clinical situations. eGFR's persistently <60 mL/min signify possible Chronic Kidney Disease.    Anion gap 7 5 - 15  CBC     Status: Abnormal   Collection Time: 04/19/16  5:43 AM  Result Value Ref Range   WBC 5.9 3.6 - 11.0 K/uL   RBC 3.51 (L) 3.80 - 5.20 MIL/uL   Hemoglobin 11.0 (L) 12.0 - 16.0 g/dL   HCT 33.0 (L) 35.0 - 47.0 %   MCV 93.9 80.0 - 100.0 fL   MCH 31.4 26.0 - 34.0 pg   MCHC 33.4 32.0 - 36.0 g/dL   RDW 16.2 (H) 11.5 - 14.5 %   Platelets 98 (L) 150 - 440 K/uL  Glucose, capillary     Status: None    Collection Time: 04/19/16  7:42 AM  Result Value Ref Range   Glucose-Capillary 90 65 - 99 mg/dL  Glucose, capillary     Status: Abnormal   Collection Time: 04/19/16 11:32 AM  Result Value Ref Range   Glucose-Capillary 162 (H) 65 - 99 mg/dL  Glucose, capillary     Status: Abnormal   Collection Time: 04/19/16  4:59 PM  Result Value Ref Range   Glucose-Capillary 141 (H) 65 - 99 mg/dL  Glucose, capillary     Status: Abnormal   Collection Time: 04/19/16  8:56 PM  Result Value Ref Range   Glucose-Capillary 153 (H) 65 - 99 mg/dL  CBC     Status: Abnormal   Collection Time: 04/20/16  4:20 AM  Result Value Ref Range   WBC 4.8 3.6 - 11.0 K/uL   RBC 3.53 (L) 3.80 - 5.20 MIL/uL   Hemoglobin 11.2 (L) 12.0 - 16.0 g/dL   HCT 33.4 (L) 35.0 - 47.0 %   MCV 94.5 80.0 - 100.0 fL   MCH 31.7 26.0 - 34.0 pg   MCHC 33.6 32.0 - 36.0 g/dL   RDW 15.8 (H) 11.5 - 14.5 %   Platelets 105 (L) 150 - 440 K/uL  Basic metabolic panel     Status: Abnormal   Collection Time: 04/20/16  4:20 AM  Result Value Ref Range   Sodium 142 135 - 145 mmol/L   Potassium 4.2 3.5 - 5.1 mmol/L   Chloride 110 101 - 111 mmol/L   CO2 28 22 - 32 mmol/L   Glucose, Bld 73 65 - 99 mg/dL   BUN 11 6 - 20 mg/dL   Creatinine, Ser 0.67 0.44 - 1.00 mg/dL   Calcium 8.5 (L) 8.9 - 10.3 mg/dL  GFR calc non Af Amer >60 >60 mL/min   GFR calc Af Amer >60 >60 mL/min    Comment: (NOTE) The eGFR has been calculated using the CKD EPI equation. This calculation has not been validated in all clinical situations. eGFR's persistently <60 mL/min signify possible Chronic Kidney Disease.    Anion gap 4 (L) 5 - 15  Glucose, capillary     Status: None   Collection Time: 04/20/16  5:49 AM  Result Value Ref Range   Glucose-Capillary 81 65 - 99 mg/dL   Comment 1 Notify RN   Glucose, capillary     Status: Abnormal   Collection Time: 04/20/16  7:57 AM  Result Value Ref Range   Glucose-Capillary 115 (H) 65 - 99 mg/dL  Sedimentation rate     Status:  Abnormal   Collection Time: 04/20/16 11:39 AM  Result Value Ref Range   Sed Rate 71 (H) 0 - 30 mm/hr  Glucose, capillary     Status: Abnormal   Collection Time: 04/20/16 12:04 PM  Result Value Ref Range   Glucose-Capillary 126 (H) 65 - 99 mg/dL  Glucose, capillary     Status: None   Collection Time: 04/20/16  4:58 PM  Result Value Ref Range   Glucose-Capillary 92 65 - 99 mg/dL  Glucose, capillary     Status: Abnormal   Collection Time: 04/20/16  9:51 PM  Result Value Ref Range   Glucose-Capillary 163 (H) 65 - 99 mg/dL  Glucose, capillary     Status: Abnormal   Collection Time: 04/21/16  7:53 AM  Result Value Ref Range   Glucose-Capillary 105 (H) 65 - 99 mg/dL    Assessment/Plan:  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 Also, involved in wound healing. On appropriate medications.   Essential hypertension blood pressure control important in reducing the progression of atherosclerotic disease. On appropriate oral medications.   Pain in limb The patient has what sounds like some neuropathy of both lower extremities, worse on the right which I suspect is a component of postphlebitic syndrome in addition to neuropathy. Given the symptoms, I have recommended leg elevation, compression stockings, and a venous reflux study to be performed at her convenience on the right leg. I have recommended anti-inflammatories as needed for the discomfort. She has strongly palpable pulses so I do not think this is arterial insufficiency  Swelling of limb I have recommended leg elevation, compression stockings, and a venous reflux study to be performed at her convenience on the right leg. I have recommended anti-inflammatories as needed for the discomfort.  Postphlebitic syndrome with inflammation The patient describes worsening pain and swelling of the right lower extremity, which is the  leg in which she had a blood clot several years ago. I have discussed the pathophysiology and natural history of postphlebitic syndrome. I have discussed the recommended treatment options of compression stockings, leg elevation, and anti-inflammatories. I am going to get a venous reflux study to make sure that significant superficial reflux is not present as well. With a previous history of cellulitis as well as the DVT, there may very well be a component of lymphedema. I will see her back following the study.      Leotis Pain 05/06/2016, 11:47 AM   This note was created with Dragon medical transcription system.  Any errors from dictation are unintentional.

## 2016-05-06 NOTE — Assessment & Plan Note (Signed)
I have recommended leg elevation, compression stockings, and a venous reflux study to be performed at her convenience on the right leg. I have recommended anti-inflammatories as needed for the discomfort.

## 2016-05-06 NOTE — Assessment & Plan Note (Addendum)
The patient describes worsening pain and swelling of the right lower extremity, which is the leg in which she had a blood clot several years ago. I have discussed the pathophysiology and natural history of postphlebitic syndrome. I have discussed the recommended treatment options of compression stockings, leg elevation, and anti-inflammatories. I am going to get a venous reflux study to make sure that significant superficial reflux is not present as well. With a previous history of cellulitis as well as the DVT, there may very well be a component of lymphedema. I will see her back following the study.

## 2016-05-09 ENCOUNTER — Other Ambulatory Visit: Payer: Self-pay | Admitting: Physician Assistant

## 2016-05-09 DIAGNOSIS — M2391 Unspecified internal derangement of right knee: Secondary | ICD-10-CM

## 2016-05-10 DIAGNOSIS — D649 Anemia, unspecified: Secondary | ICD-10-CM | POA: Diagnosis not present

## 2016-05-10 DIAGNOSIS — Z794 Long term (current) use of insulin: Secondary | ICD-10-CM | POA: Diagnosis not present

## 2016-05-10 DIAGNOSIS — N390 Urinary tract infection, site not specified: Secondary | ICD-10-CM | POA: Diagnosis not present

## 2016-05-10 DIAGNOSIS — B9620 Unspecified Escherichia coli [E. coli] as the cause of diseases classified elsewhere: Secondary | ICD-10-CM | POA: Diagnosis not present

## 2016-05-10 DIAGNOSIS — Z7951 Long term (current) use of inhaled steroids: Secondary | ICD-10-CM | POA: Diagnosis not present

## 2016-05-10 DIAGNOSIS — J440 Chronic obstructive pulmonary disease with acute lower respiratory infection: Secondary | ICD-10-CM | POA: Diagnosis not present

## 2016-05-10 DIAGNOSIS — I10 Essential (primary) hypertension: Secondary | ICD-10-CM | POA: Diagnosis not present

## 2016-05-10 DIAGNOSIS — J189 Pneumonia, unspecified organism: Secondary | ICD-10-CM | POA: Diagnosis not present

## 2016-05-10 DIAGNOSIS — K7581 Nonalcoholic steatohepatitis (NASH): Secondary | ICD-10-CM | POA: Diagnosis not present

## 2016-05-10 DIAGNOSIS — L03115 Cellulitis of right lower limb: Secondary | ICD-10-CM | POA: Diagnosis not present

## 2016-05-10 DIAGNOSIS — E785 Hyperlipidemia, unspecified: Secondary | ICD-10-CM | POA: Diagnosis not present

## 2016-05-10 DIAGNOSIS — E119 Type 2 diabetes mellitus without complications: Secondary | ICD-10-CM | POA: Diagnosis not present

## 2016-05-11 DIAGNOSIS — Z794 Long term (current) use of insulin: Secondary | ICD-10-CM | POA: Diagnosis not present

## 2016-05-11 DIAGNOSIS — K7581 Nonalcoholic steatohepatitis (NASH): Secondary | ICD-10-CM | POA: Diagnosis not present

## 2016-05-11 DIAGNOSIS — I10 Essential (primary) hypertension: Secondary | ICD-10-CM | POA: Diagnosis not present

## 2016-05-11 DIAGNOSIS — D649 Anemia, unspecified: Secondary | ICD-10-CM | POA: Diagnosis not present

## 2016-05-11 DIAGNOSIS — J440 Chronic obstructive pulmonary disease with acute lower respiratory infection: Secondary | ICD-10-CM | POA: Diagnosis not present

## 2016-05-11 DIAGNOSIS — J189 Pneumonia, unspecified organism: Secondary | ICD-10-CM | POA: Diagnosis not present

## 2016-05-11 DIAGNOSIS — Z7951 Long term (current) use of inhaled steroids: Secondary | ICD-10-CM | POA: Diagnosis not present

## 2016-05-11 DIAGNOSIS — N390 Urinary tract infection, site not specified: Secondary | ICD-10-CM | POA: Diagnosis not present

## 2016-05-11 DIAGNOSIS — E119 Type 2 diabetes mellitus without complications: Secondary | ICD-10-CM | POA: Diagnosis not present

## 2016-05-11 DIAGNOSIS — E785 Hyperlipidemia, unspecified: Secondary | ICD-10-CM | POA: Diagnosis not present

## 2016-05-11 DIAGNOSIS — B9620 Unspecified Escherichia coli [E. coli] as the cause of diseases classified elsewhere: Secondary | ICD-10-CM | POA: Diagnosis not present

## 2016-05-11 DIAGNOSIS — L03115 Cellulitis of right lower limb: Secondary | ICD-10-CM | POA: Diagnosis not present

## 2016-05-11 DIAGNOSIS — Z79899 Other long term (current) drug therapy: Secondary | ICD-10-CM | POA: Diagnosis not present

## 2016-05-12 DIAGNOSIS — Z79899 Other long term (current) drug therapy: Secondary | ICD-10-CM | POA: Diagnosis not present

## 2016-05-16 NOTE — Progress Notes (Signed)
   04/19/16 2229  Acute Rehab PT Goals  Patient Stated Goal to get up to chair  PT Goal Formulation With patient  Time For Goal Achievement 05/03/16  Potential to Achieve Goals Fair  PT Time Calculation  PT Start Time (ACUTE ONLY) 1054  PT Stop Time (ACUTE ONLY) 1115  PT Time Calculation (min) (ACUTE ONLY) 21 min  PT G-Codes **NOT FOR INPATIENT CLASS**  Functional Assessment Tool Used clinical judgement  Functional Limitation Mobility: Walking and moving around  Mobility: Walking and Moving Around Current Status (Q9826) CK  Mobility: Walking and Moving Around Goal Status (E1583) CJ  PT General Charges  $$ ACUTE PT VISIT 1 Procedure  PT Evaluation  $PT Eval Moderate Complexity 1 Procedure  PT Treatments  $Therapeutic Activity 8-22 mins    Late-entry G-codes entered after review of initial documentation.  Niah Heinle H. Owens Shark, PT, DPT, NCS 05/16/16, 2:51 PM [phone]

## 2016-05-17 DIAGNOSIS — N390 Urinary tract infection, site not specified: Secondary | ICD-10-CM | POA: Diagnosis not present

## 2016-05-17 DIAGNOSIS — I10 Essential (primary) hypertension: Secondary | ICD-10-CM | POA: Diagnosis not present

## 2016-05-17 DIAGNOSIS — J440 Chronic obstructive pulmonary disease with acute lower respiratory infection: Secondary | ICD-10-CM | POA: Diagnosis not present

## 2016-05-17 DIAGNOSIS — K7581 Nonalcoholic steatohepatitis (NASH): Secondary | ICD-10-CM | POA: Diagnosis not present

## 2016-05-17 DIAGNOSIS — D649 Anemia, unspecified: Secondary | ICD-10-CM | POA: Diagnosis not present

## 2016-05-17 DIAGNOSIS — Z7951 Long term (current) use of inhaled steroids: Secondary | ICD-10-CM | POA: Diagnosis not present

## 2016-05-17 DIAGNOSIS — L03115 Cellulitis of right lower limb: Secondary | ICD-10-CM | POA: Diagnosis not present

## 2016-05-17 DIAGNOSIS — E785 Hyperlipidemia, unspecified: Secondary | ICD-10-CM | POA: Diagnosis not present

## 2016-05-17 DIAGNOSIS — J189 Pneumonia, unspecified organism: Secondary | ICD-10-CM | POA: Diagnosis not present

## 2016-05-17 DIAGNOSIS — B9620 Unspecified Escherichia coli [E. coli] as the cause of diseases classified elsewhere: Secondary | ICD-10-CM | POA: Diagnosis not present

## 2016-05-17 DIAGNOSIS — Z794 Long term (current) use of insulin: Secondary | ICD-10-CM | POA: Diagnosis not present

## 2016-05-17 DIAGNOSIS — E119 Type 2 diabetes mellitus without complications: Secondary | ICD-10-CM | POA: Diagnosis not present

## 2016-05-23 ENCOUNTER — Ambulatory Visit
Admission: RE | Admit: 2016-05-23 | Discharge: 2016-05-23 | Disposition: A | Discharge status: Home / Self Care | Payer: Medicare Other | Source: Ambulatory Visit | Attending: Physician Assistant | Admitting: Physician Assistant

## 2016-05-23 DIAGNOSIS — Z7951 Long term (current) use of inhaled steroids: Secondary | ICD-10-CM | POA: Diagnosis not present

## 2016-05-23 DIAGNOSIS — J189 Pneumonia, unspecified organism: Secondary | ICD-10-CM | POA: Diagnosis not present

## 2016-05-23 DIAGNOSIS — J440 Chronic obstructive pulmonary disease with acute lower respiratory infection: Secondary | ICD-10-CM | POA: Diagnosis not present

## 2016-05-23 DIAGNOSIS — Z794 Long term (current) use of insulin: Secondary | ICD-10-CM | POA: Diagnosis not present

## 2016-05-23 DIAGNOSIS — E785 Hyperlipidemia, unspecified: Secondary | ICD-10-CM | POA: Diagnosis not present

## 2016-05-23 DIAGNOSIS — D649 Anemia, unspecified: Secondary | ICD-10-CM | POA: Diagnosis not present

## 2016-05-23 DIAGNOSIS — I10 Essential (primary) hypertension: Secondary | ICD-10-CM | POA: Diagnosis not present

## 2016-05-23 DIAGNOSIS — L03115 Cellulitis of right lower limb: Secondary | ICD-10-CM | POA: Diagnosis not present

## 2016-05-23 DIAGNOSIS — M1711 Unilateral primary osteoarthritis, right knee: Secondary | ICD-10-CM | POA: Insufficient documentation

## 2016-05-23 DIAGNOSIS — B9620 Unspecified Escherichia coli [E. coli] as the cause of diseases classified elsewhere: Secondary | ICD-10-CM | POA: Diagnosis not present

## 2016-05-23 DIAGNOSIS — M2341 Loose body in knee, right knee: Secondary | ICD-10-CM | POA: Diagnosis not present

## 2016-05-23 DIAGNOSIS — E119 Type 2 diabetes mellitus without complications: Secondary | ICD-10-CM | POA: Diagnosis not present

## 2016-05-23 DIAGNOSIS — N390 Urinary tract infection, site not specified: Secondary | ICD-10-CM | POA: Diagnosis not present

## 2016-05-23 DIAGNOSIS — M2391 Unspecified internal derangement of right knee: Secondary | ICD-10-CM | POA: Diagnosis present

## 2016-05-23 DIAGNOSIS — M25561 Pain in right knee: Secondary | ICD-10-CM | POA: Diagnosis not present

## 2016-05-23 DIAGNOSIS — K7581 Nonalcoholic steatohepatitis (NASH): Secondary | ICD-10-CM | POA: Diagnosis not present

## 2016-05-23 DIAGNOSIS — G4733 Obstructive sleep apnea (adult) (pediatric): Secondary | ICD-10-CM | POA: Diagnosis not present

## 2016-05-26 DIAGNOSIS — R269 Unspecified abnormalities of gait and mobility: Secondary | ICD-10-CM | POA: Diagnosis not present

## 2016-05-26 DIAGNOSIS — E119 Type 2 diabetes mellitus without complications: Secondary | ICD-10-CM | POA: Diagnosis not present

## 2016-05-26 DIAGNOSIS — G629 Polyneuropathy, unspecified: Secondary | ICD-10-CM | POA: Diagnosis not present

## 2016-05-26 DIAGNOSIS — Z79899 Other long term (current) drug therapy: Secondary | ICD-10-CM | POA: Diagnosis not present

## 2016-05-31 ENCOUNTER — Emergency Department: Payer: Medicare Other

## 2016-05-31 ENCOUNTER — Emergency Department
Admission: EM | Admit: 2016-05-31 | Discharge: 2016-05-31 | Disposition: A | Discharge status: Home / Self Care | Payer: Medicare Other | Attending: Emergency Medicine | Admitting: Emergency Medicine

## 2016-05-31 DIAGNOSIS — Z79899 Other long term (current) drug therapy: Secondary | ICD-10-CM | POA: Diagnosis not present

## 2016-05-31 DIAGNOSIS — E119 Type 2 diabetes mellitus without complications: Secondary | ICD-10-CM | POA: Diagnosis not present

## 2016-05-31 DIAGNOSIS — S79911A Unspecified injury of right hip, initial encounter: Secondary | ICD-10-CM | POA: Diagnosis not present

## 2016-05-31 DIAGNOSIS — Z794 Long term (current) use of insulin: Secondary | ICD-10-CM | POA: Insufficient documentation

## 2016-05-31 DIAGNOSIS — Y939 Activity, unspecified: Secondary | ICD-10-CM | POA: Diagnosis not present

## 2016-05-31 DIAGNOSIS — Z87891 Personal history of nicotine dependence: Secondary | ICD-10-CM | POA: Insufficient documentation

## 2016-05-31 DIAGNOSIS — M25551 Pain in right hip: Secondary | ICD-10-CM | POA: Insufficient documentation

## 2016-05-31 DIAGNOSIS — Y929 Unspecified place or not applicable: Secondary | ICD-10-CM | POA: Insufficient documentation

## 2016-05-31 DIAGNOSIS — W1839XA Other fall on same level, initial encounter: Secondary | ICD-10-CM | POA: Diagnosis not present

## 2016-05-31 DIAGNOSIS — I10 Essential (primary) hypertension: Secondary | ICD-10-CM | POA: Insufficient documentation

## 2016-05-31 DIAGNOSIS — M25461 Effusion, right knee: Secondary | ICD-10-CM | POA: Diagnosis not present

## 2016-05-31 DIAGNOSIS — Y999 Unspecified external cause status: Secondary | ICD-10-CM | POA: Insufficient documentation

## 2016-05-31 DIAGNOSIS — T1490XA Injury, unspecified, initial encounter: Secondary | ICD-10-CM

## 2016-05-31 DIAGNOSIS — Z743 Need for continuous supervision: Secondary | ICD-10-CM | POA: Diagnosis not present

## 2016-05-31 DIAGNOSIS — S7001XA Contusion of right hip, initial encounter: Secondary | ICD-10-CM | POA: Diagnosis not present

## 2016-05-31 DIAGNOSIS — J45909 Unspecified asthma, uncomplicated: Secondary | ICD-10-CM | POA: Insufficient documentation

## 2016-05-31 LAB — CBC WITH DIFFERENTIAL/PLATELET
Basophils Absolute: 0 10*3/uL (ref 0–0.1)
Basophils Relative: 1 %
EOS PCT: 2 %
Eosinophils Absolute: 0.1 10*3/uL (ref 0–0.7)
HEMATOCRIT: 34 % — AB (ref 35.0–47.0)
HEMOGLOBIN: 11.5 g/dL — AB (ref 12.0–16.0)
LYMPHS ABS: 0.9 10*3/uL — AB (ref 1.0–3.6)
LYMPHS PCT: 16 %
MCH: 31.3 pg (ref 26.0–34.0)
MCHC: 33.8 g/dL (ref 32.0–36.0)
MCV: 92.6 fL (ref 80.0–100.0)
Monocytes Absolute: 0.6 10*3/uL (ref 0.2–0.9)
Monocytes Relative: 10 %
NEUTROS ABS: 4 10*3/uL (ref 1.4–6.5)
Neutrophils Relative %: 71 %
PLATELETS: 92 10*3/uL — AB (ref 150–440)
RBC: 3.67 MIL/uL — AB (ref 3.80–5.20)
RDW: 16.8 % — ABNORMAL HIGH (ref 11.5–14.5)
WBC: 5.7 10*3/uL (ref 3.6–11.0)

## 2016-05-31 LAB — COMPREHENSIVE METABOLIC PANEL
ALK PHOS: 125 U/L (ref 38–126)
ALT: 23 U/L (ref 14–54)
AST: 33 U/L (ref 15–41)
Albumin: 3.4 g/dL — ABNORMAL LOW (ref 3.5–5.0)
Anion gap: 6 (ref 5–15)
BILIRUBIN TOTAL: 0.8 mg/dL (ref 0.3–1.2)
BUN: 22 mg/dL — AB (ref 6–20)
CALCIUM: 9 mg/dL (ref 8.9–10.3)
CO2: 32 mmol/L (ref 22–32)
CREATININE: 0.92 mg/dL (ref 0.44–1.00)
Chloride: 104 mmol/L (ref 101–111)
Glucose, Bld: 82 mg/dL (ref 65–99)
Potassium: 3.4 mmol/L — ABNORMAL LOW (ref 3.5–5.1)
Sodium: 142 mmol/L (ref 135–145)
TOTAL PROTEIN: 6.8 g/dL (ref 6.5–8.1)

## 2016-05-31 MED ORDER — HYDROCODONE-ACETAMINOPHEN 5-325 MG PO TABS: 1.0000 | ORAL_TABLET | Freq: Four times a day (QID) | ORAL | 0 refills | Status: DC | PRN

## 2016-05-31 MED ORDER — HYDROMORPHONE HCL 1 MG/ML IJ SOLN
1.0000 mg | Freq: Once | INTRAMUSCULAR | Status: AC
  Administered 2016-05-31: 1 mg via INTRAVENOUS
  Filled 2016-05-31: qty 1

## 2016-05-31 MED ORDER — KETOROLAC TROMETHAMINE 30 MG/ML IJ SOLN
15.0000 mg | Freq: Once | INTRAMUSCULAR | Status: AC
  Administered 2016-05-31: 15 mg via INTRAVENOUS
  Filled 2016-05-31: qty 1

## 2016-05-31 MED ORDER — LIDOCAINE 5 % EX PTCH: 1.0000 | MEDICATED_PATCH | CUTANEOUS | 0 refills | Status: DC

## 2016-05-31 MED ORDER — ONDANSETRON HCL 4 MG/2ML IJ SOLN
4.0000 mg | Freq: Once | INTRAMUSCULAR | Status: AC
  Administered 2016-05-31: 4 mg via INTRAVENOUS
  Filled 2016-05-31: qty 2

## 2016-05-31 MED ORDER — LIDOCAINE 5 % EX PTCH
1.0000 | MEDICATED_PATCH | CUTANEOUS | Status: DC
  Administered 2016-05-31: 1 via TRANSDERMAL
  Filled 2016-05-31: qty 1

## 2016-05-31 NOTE — ED Notes (Signed)
Secretary notified to call ems for transport back to the [facility] [employer]

## 2016-05-31 NOTE — Discharge Instructions (Signed)
Patient had negative x-ray and CAT scan evaluation of her right hip. She most likely has a hip contusion. She was able to bear weight and transition here in emergency department and likely will continue to improve with pain medication.

## 2016-05-31 NOTE — ED Notes (Signed)
Attempted to have pt stand and bear weight - pt sat on side of bed but was unable/unwilling to stand - c/o severe right hip/leg pain and stated that she could not bear weight on it - Dr Marcelene Butte informed

## 2016-05-31 NOTE — ED Provider Notes (Signed)
Time Seen: Approximately 1621  I have reviewed the triage notes  Chief Complaint: Fall and Hip Pain   History of Present Illness: Stephanie [name] is a 65 y.o. female who presents after a non-syncopal fall today. Patient had difficulty getting up and ambulating and describes some right hip pain with radiation down the right leg toward the right knee area. She denies any head trauma or right upper extremity trauma. 70 chest pain shortness of breath etc. She states she felt fine prior to the fall. She was transported here by EMS from the [facility].   Past Medical History:  Diagnosis Date  . Adenomatous colon polyp 08/27/2014   Polyps x 3  . Anemia   . Anxiety   . 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 Arthritis   . Asthma   . Bipolar affective disorder ([place])    h/o  . Blood transfusion 2000  . Blood transfusion without reported diagnosis   . Chronic abdominal pain    abdominal wall pain  . 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 Colon polyp   . Depression   . Diabetes mellitus without complication ([place])   . Fatty liver 04/09/08   found in abd CT  . GERD (gastroesophageal reflux disease)   . H/O: CVA (cardiovascular accident)    TIA  . H/O: rheumatic fever   . Hyperlipidemia   . Incontinence   . Insomnia   . Obstructive sleep apnea    not using CPAP, last sleep study 2.5 years ago, per pt doctor is aware  . Platelets decreased ([place])   . RLS (restless legs syndrome)   . Shortness of breath   . Sleep apnea   . Stroke [facility])    TIA- 2002  . Ulcer ([place])   . Unspecified disorders of nervous system     Patient Active Problem List   Diagnosis Date Noted  . Pain in limb 05/06/2016  . Swelling of limb 05/06/2016  . Postphlebitic syndrome with inflammation 05/06/2016  . Pneumonia 04/18/2016  . Chest pain 04/04/2016  . Iron deficiency anemia 01/30/2016  . Hypoglycemia 09/09/2015  . Hyperglycemia 07/16/2015  . Neuropathy ([place]) 06/08/2015  . Thrombocytopenia ([place]) 08/19/2014   . Abdominal pain, chronic, epigastric 06/12/2014  . Liver cirrhosis secondary to NASH ([place]) 02/27/2014  . Dizziness and giddiness 10/30/2013  . DOE (dyspnea on exertion) 10/30/2013  . Poorly controlled type 2 diabetes mellitus with complication ([place]) 58/83/2549  . DNR (do not resuscitate) 07/29/2013  . Encounter for routine gynecological examination 07/29/2013  . Dyspnea 07/10/2013  . Lung nodule 07/10/2013  . Varicose veins of lower extremities with other complications 82/64/1583  . GERD (gastroesophageal reflux disease) 10/09/2012  . Cirrhosis of liver ([place]) 02/02/2012  . Anxiety 07/11/2011  . Aortic dissection ([place])   . BACK PAIN, LUMBAR, CHRONIC 11/19/2009  . UNS ADVRS EFF OTH RX MEDICINAL&BIOLOGICAL SBSTNC 12/16/2008  . UNSPECIFIED VITAMIN D DEFICIENCY 09/26/2008  . TOBACCO ABUSE 06/19/2008  . RESTLESS LEGS SYNDROME 04/27/2007  . INSOMNIA 04/27/2007  . HLD (hyperlipidemia) 03/08/2007  . OSA (obstructive sleep apnea) 03/08/2007  . Essential hypertension 03/08/2007  . Asthma 03/08/2007  . BIPOLAR AFFECTIVE DISORDER, HX OF 03/08/2007    Past Surgical History:  Procedure Laterality Date  . ABDOMINAL EXPLORATION SURGERY    . ABDOMINAL HYSTERECTOMY     total  . Axillary artery cannulation via 8-mm Hemashield graft, median sternotomy, extracorporeal circulation with deep hypothermic circulatory arrest, repair of  aortic  dessection  01/23/2009   Dr Arlyce Dice  . BACK SURGERY     x 45  . cataract     both eyes  . CHOLECYSTECTOMY    . COLONOSCOPY    . EYE SURGERY     bilat  . HEMORROIDECTOMY     and colon polyp removed  . LIVER BIOPSY  04/10/2012   Procedure: LIVER BIOPSY;  Surgeon: Inda Castle, MD;  Location: [facility];  Service: Endoscopy;  Laterality: N/A;  ultrasound to mark order for abd limited/liver to be marked to be sent by [email or web address]   . LUMBAR FUSION  10/09  . LUMBAR WOUND DEBRIDEMENT  05/09/2011   Procedure: LUMBAR WOUND DEBRIDEMENT;  Surgeon: Dahlia Bailiff;  Location: [facility] [place];  Service: Orthopedics;  Laterality: N/A;  IRRIGATION AND DEBRIDEMENT SPINAL WOUND  . POSTERIOR CERVICAL FUSION/FORAMINOTOMY    . ROTATOR CUFF REPAIR     left    Past Surgical History:  Procedure Laterality Date  . ABDOMINAL EXPLORATION SURGERY    . ABDOMINAL HYSTERECTOMY     total  . Axillary artery cannulation via 8-mm Hemashield graft, median sternotomy, extracorporeal circulation with deep hypothermic circulatory arrest, repair of  aortic dessection  01/23/2009   Dr Arlyce Dice  . BACK SURGERY     x 45  . cataract     both eyes  . CHOLECYSTECTOMY    . COLONOSCOPY    . EYE SURGERY     bilat  . HEMORROIDECTOMY     and colon polyp removed  . LIVER BIOPSY  04/10/2012   Procedure: LIVER BIOPSY;  Surgeon: Inda Castle, MD;  Location: [facility];  Service: Endoscopy;  Laterality: N/A;  ultrasound to mark order for abd limited/liver to be marked to be sent by [email or web address]   . LUMBAR FUSION  10/09  . LUMBAR WOUND DEBRIDEMENT  05/09/2011   Procedure: LUMBAR WOUND DEBRIDEMENT;  Surgeon: Dahlia Bailiff;  Location: [place];  Service: Orthopedics;  Laterality: N/A;  IRRIGATION AND DEBRIDEMENT SPINAL WOUND  . POSTERIOR CERVICAL FUSION/FORAMINOTOMY    . ROTATOR CUFF REPAIR     left    Current Outpatient Rx  . Order #: [id number] Class: Historical Med  . Order #: [id number] Class: Normal  . Order #: [id number] Class: Historical Med  . Order #: [id number] Class: Historical Med  . Order #: [id number] Class: Historical Med  . Order #: [id number] Class: Historical Med  . Order #: [id number] Class: Historical Med  . Order #: [id number] Class: Historical Med  . Order #: [id number] Class: Historical Med  . Order #: [id number] Class: Historical Med  . Order #: [id number] Class: Historical Med  . Order #: [id number] Class: Historical Med  . Order #: [id number] Class: Historical Med  . Order #: [id number] Class: Historical Med  . Order #: [id number] Class: Historical Med  . Order #: [id number] Class:  Historical Med  . Order #: [id number] Class: Historical Med  . Order #: [id number] Class: Historical Med  . Order #: [id number] Class: Historical Med  . Order #: [id number] Class: Historical Med  . Order #: [id number] Class: Historical Med  . Order #: [id number] Class: Historical Med  . Order #: [id number] Class: Historical Med  . Order #: [id number] Class: Historical Med  . Order #: [id number] Class: Historical Med  . Order #: [id number] Class: Historical Med  . Order #: [id number] Class: Historical Med  . Order #: [id number] Class: Historical Med  . Order #: [id number] Class: Historical Med  . Order #: [id number] Class: Historical Med  . Order #: [id number] Class: Historical Med  . Order #: [id number] Class: Historical Med  . Order #: [id number] Class: Historical Med  Allergies:  Doxycycline  Family History: Family History  Problem Relation Age of Onset  . Breast cancer Mother   . Lung cancer Mother   . Stomach cancer Father   . Diabetes      4 aunts, and 1 uncle  . Colon cancer Neg Hx   . Esophageal cancer Neg Hx   . Rectal cancer Neg Hx     Social History: Social History  Substance Use Topics  . Smoking status: Former Smoker    Packs/day: 1.50    Years: 50.00    Types: Cigarettes    Quit date: 04/06/2013  . Smokeless tobacco: Never Used  . Alcohol use No     Review of Systems:   10 point review of systems was performed and was otherwise negative:  Constitutional: No fever Eyes: No visual disturbances ENT: No sore throat, ear pain Cardiac: No chest pain Respiratory: No shortness of breath, wheezing, or stridor Abdomen: No abdominal pain, no vomiting, No diarrhea Endocrine: No weight loss, No night sweats Extremities: No peripheral edema, cyanosis Skin: No rashes, easy bruising Neurologic: No focal weakness, trouble with speech or swollowing Urologic: No dysuria, Hematuria, or urinary frequency   Physical Exam:  ED Triage Vitals  Enc Vitals Group     BP 05/31/16 1608 113/62      Pulse Rate 05/31/16 1608 91     Resp 05/31/16 1608 17     Temp 05/31/16 1608 97.6 F (36.4 C)     Temp Source 05/31/16 1608 Oral     SpO2 05/31/16 1608 96 %     Weight 05/31/16 1608 219 lb (99.3 kg)     Height 05/31/16 1608 5' 2"  (1.575 m)     Head Circumference --      Peak Flow --      Pain Score 05/31/16 1609 9     Pain Loc --      Pain Edu? --      Excl. in [place]? --     General: Awake , Alert , and Oriented times 3; GCS 15 Head: Normal cephalic , atraumatic Eyes: Pupils equal , round, reactive to light Nose/Throat: No nasal drainage, patent upper airway without erythema or exudate.  Neck: Supple, Full range of motion, No anterior adenopathy or palpable thyroid masses Lungs: Clear to ascultation without wheezes , rhonchi, or rales Heart: Regular rate, regular rhythm without murmurs , gallops , or rubs Abdomen: Soft, non tender without rebound, guarding , or rigidity; bowel sounds positive and symmetric in all 4 quadrants. No organomegaly .        Extremities: Patient does not have any shortening of the right lower extremity. Tenderness diffusely with minimal movement at the right knee and right hip region. The extremity appears to be neurovascularly intact and has 2+ equal pulses Neurologic: normal ambulation, Motor symmetric without deficits, sensory intact Skin: warm, dry, no rashes   Labs:   All laboratory work was reviewed including any pertinent negatives or positives listed below:  Labs Reviewed  CBC WITH DIFFERENTIAL/PLATELET  COMPREHENSIVE METABOLIC PANEL   Radiology: "Dg Knee 2 Views Right  Result Date: 05/31/2016 CLINICAL DATA:  Status post fall onto right side, with diffuse right knee pain. Initial encounter. EXAM: RIGHT KNEE - 1-2 VIEW COMPARISON:  Right knee radiographs performed 03/09/2016, and MRI of the right knee performed 05/23/2016 FINDINGS: There is no evidence of fracture or dislocation. The joint spaces are preserved. Mild marginal osteophyte formation  is noted at the lateral compartment. Mild cortical  irregularity is seen along the articular surface of the patella. A small knee joint effusion is seen. A fabella is noted. The visualized soft tissues are normal in appearance. IMPRESSION: 1. No evidence of fracture or dislocation. 2. Mild degenerative change at the lateral and patellofemoral compartments. 3. Small knee joint effusion noted. Electronically Signed   By: Garald Balding M.D.   On: 05/31/2016 17:31   Ct Hip Right Wo Contrast  Result Date: 05/31/2016 CLINICAL DATA:  Status post fall, right hip pain EXAM: CT OF THE RIGHT HIP WITHOUT CONTRAST TECHNIQUE: Multidetector CT imaging of the right hip was performed according to the standard protocol. Multiplanar CT image reconstructions were also generated. COMPARISON:  None. FINDINGS: Bones/Joint/Cartilage No acute hip fracture or dislocation. Mild hip joint space narrowing. No lytic or sclerotic osseous lesion. No periosteal reaction or bone destruction. Mild osteoarthritis of the right sacroiliac joint. Ligaments Suboptimally assessed by CT. Muscles and Tendons Muscles are normal.  No muscle atrophy. Soft tissues No fluid collection or hematoma. IMPRESSION: No acute osseous injury of the right hip. Electronically Signed   By: Kathreen Devoid   On: 05/31/2016 20:00   Mr Knee Right Wo Contrast  Result Date: 05/23/2016 CLINICAL DATA:  Right knee pain and swelling for 4-5 months. No known injury. EXAM: MRI OF THE RIGHT KNEE WITHOUT CONTRAST TECHNIQUE: Multiplanar, multisequence MR imaging of the knee was performed. No intravenous contrast was administered. COMPARISON:  Plain films right knee 08/06/2012 and 03/09/2016. FINDINGS: MENISCI Medial meniscus: Degenerative signal is seen in the posterior horn. No tear. Lateral meniscus:  Intact. LIGAMENTS Cruciates:  Intact. Collaterals:  Intact. CARTILAGE Patellofemoral: Extensive cartilage loss is present along the lateral femoral trochlea and lateral patellar  facet with associated marrow edema. Small subchondral cysts in the lateral patellar facet also noted. Medial:  Minimally degenerated. Lateral:  Negative. Joint: Small to moderate joint effusion is identified. Loose hyaline cartilage fragment along the medial aspect of the intercondylar notch of the femur measures 0.9 cm in diameter. A 0.5 cm in diameter loose cartilage fragment is seen along the medial femoral condyle. Popliteal Fossa:  No Baker's cyst. Extensor Mechanism:  Intact. Bones: No fracture or worrisome lesion. Small lateral compartment osteophytes noted. Other: None. IMPRESSION: Negative for meniscal or ligament tear. 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 Two loose cartilage fragments are identified in the joint. One is along the medial femoral condyle and a second is seen along the medial side of the intercondylar notch of the femur. Electronically Signed   By: Inge Rise M.D.   On: 05/23/2016 14:33   Dg Hip Unilat W Or Wo Pelvis 2-3 Views Right  Result Date: 05/31/2016 CLINICAL DATA:  65 year old female with history of trauma from a fall onto the right side today complaining of pain in the right hip and right knee. EXAM: DG HIP (WITH OR WITHOUT PELVIS) 2-3V RIGHT COMPARISON:  No priors. FINDINGS: There is no evidence of hip fracture or dislocation. Mild bilateral hip joint osteoarthritis. IMPRESSION: 1. No acute radiographic abnormality of the bony pelvis or the right hip. Electronically Signed   By: Vinnie Langton M.D.   On: 05/31/2016 17:35  "    I personally reviewed the radiologic studies    ED Course:  Patient received pain medication here in emergency department. Patient initially would not stand after x-ray evaluation was negative for her right hip which is primary area of concern. Knee felt this did not  show any fracture or dislocation. Patient then underwent a right hip CT which  showed some loose cartilage but otherwise no bony fragments or indications of a hip fracture. Patient was then again requested to bear weight which occurred at the bedside. Patient will be transported back to nursing facility with prescriptions for pain control. Clinical Course      Assessment:  Right hip contusion     Plan: * Outpatient " New Prescriptions   HYDROCODONE-ACETAMINOPHEN (NORCO) 5-325 MG TABLET    Take 1 tablet by mouth every 6 (six) hours as needed for moderate pain.   LIDOCAINE (LIDODERM) 5 %    Place 1 patch onto the skin daily.  " Patient was advised to return immediately if condition worsens. Patient was advised to follow up with their primary care physician or other specialized physicians involved in their outpatient care. The patient and/or family member/power of attorney had laboratory results reviewed at the bedside. All questions and concerns were addressed and appropriate discharge instructions were distributed by the nursing staff.             Daymon Larsen, MD 05/31/16 2017

## 2016-05-31 NOTE — ED Triage Notes (Signed)
Pt to ed via [facility] from the [facility] with reports of having a fall today and now c/o right hip pain. Ems reports no deformity, no shortening or rotation. Pt with hx of demenita, cbg 119, all VSS, bp 140/71, hr 80, 100% ra.

## 2016-05-31 NOTE — ED Notes (Signed)
MD Marcelene Butte discussed CT findings with pt - pt was advised that for her to be discharged she had to be able to stand and bear weight - pt does not want to be admitted - with one assist pt was able to stand and bear weight

## 2016-06-01 ENCOUNTER — Other Ambulatory Visit: Payer: Self-pay | Admitting: Thoracic Surgery (Cardiothoracic Vascular Surgery)

## 2016-06-01 DIAGNOSIS — R911 Solitary pulmonary nodule: Secondary | ICD-10-CM

## 2016-06-01 DIAGNOSIS — D309 Benign neoplasm of urinary organ, unspecified: Secondary | ICD-10-CM | POA: Diagnosis not present

## 2016-06-02 ENCOUNTER — Inpatient Hospital Stay: Payer: Medicare Other

## 2016-06-02 ENCOUNTER — Encounter: Payer: Self-pay | Admitting: Hematology and Oncology

## 2016-06-02 ENCOUNTER — Inpatient Hospital Stay: Payer: Medicare Other | Attending: Hematology and Oncology | Admitting: Hematology and Oncology

## 2016-06-02 VITALS — BP 113/63 | HR 79 | Temp 95.1°F | Resp 18 | Wt 233.1 lb

## 2016-06-02 DIAGNOSIS — M25561 Pain in right knee: Secondary | ICD-10-CM | POA: Diagnosis not present

## 2016-06-02 DIAGNOSIS — R109 Unspecified abdominal pain: Secondary | ICD-10-CM

## 2016-06-02 DIAGNOSIS — G4700 Insomnia, unspecified: Secondary | ICD-10-CM | POA: Insufficient documentation

## 2016-06-02 DIAGNOSIS — D696 Thrombocytopenia, unspecified: Secondary | ICD-10-CM | POA: Diagnosis not present

## 2016-06-02 DIAGNOSIS — D509 Iron deficiency anemia, unspecified: Secondary | ICD-10-CM | POA: Diagnosis not present

## 2016-06-02 DIAGNOSIS — K219 Gastro-esophageal reflux disease without esophagitis: Secondary | ICD-10-CM

## 2016-06-02 DIAGNOSIS — Z8711 Personal history of peptic ulcer disease: Secondary | ICD-10-CM | POA: Diagnosis not present

## 2016-06-02 DIAGNOSIS — F319 Bipolar disorder, unspecified: Secondary | ICD-10-CM | POA: Diagnosis not present

## 2016-06-02 DIAGNOSIS — E785 Hyperlipidemia, unspecified: Secondary | ICD-10-CM | POA: Insufficient documentation

## 2016-06-02 DIAGNOSIS — F5089 Other specified eating disorder: Secondary | ICD-10-CM | POA: Diagnosis not present

## 2016-06-02 DIAGNOSIS — E119 Type 2 diabetes mellitus without complications: Secondary | ICD-10-CM | POA: Diagnosis not present

## 2016-06-02 DIAGNOSIS — Z803 Family history of malignant neoplasm of breast: Secondary | ICD-10-CM | POA: Insufficient documentation

## 2016-06-02 DIAGNOSIS — F419 Anxiety disorder, unspecified: Secondary | ICD-10-CM | POA: Diagnosis not present

## 2016-06-02 DIAGNOSIS — R5383 Other fatigue: Secondary | ICD-10-CM | POA: Diagnosis not present

## 2016-06-02 DIAGNOSIS — Z801 Family history of malignant neoplasm of trachea, bronchus and lung: Secondary | ICD-10-CM | POA: Insufficient documentation

## 2016-06-02 DIAGNOSIS — F329 Major depressive disorder, single episode, unspecified: Secondary | ICD-10-CM | POA: Diagnosis not present

## 2016-06-02 DIAGNOSIS — Z7951 Long term (current) use of inhaled steroids: Secondary | ICD-10-CM | POA: Insufficient documentation

## 2016-06-02 DIAGNOSIS — J45909 Unspecified asthma, uncomplicated: Secondary | ICD-10-CM | POA: Diagnosis not present

## 2016-06-02 DIAGNOSIS — Z79899 Other long term (current) drug therapy: Secondary | ICD-10-CM | POA: Insufficient documentation

## 2016-06-02 DIAGNOSIS — Z87891 Personal history of nicotine dependence: Secondary | ICD-10-CM | POA: Diagnosis not present

## 2016-06-02 DIAGNOSIS — M129 Arthropathy, unspecified: Secondary | ICD-10-CM | POA: Insufficient documentation

## 2016-06-02 DIAGNOSIS — Z794 Long term (current) use of insulin: Secondary | ICD-10-CM | POA: Insufficient documentation

## 2016-06-02 DIAGNOSIS — G2581 Restless legs syndrome: Secondary | ICD-10-CM | POA: Diagnosis not present

## 2016-06-02 DIAGNOSIS — R161 Splenomegaly, not elsewhere classified: Secondary | ICD-10-CM | POA: Diagnosis not present

## 2016-06-02 DIAGNOSIS — G4730 Sleep apnea, unspecified: Secondary | ICD-10-CM | POA: Insufficient documentation

## 2016-06-02 DIAGNOSIS — Z800 Family history of malignant neoplasm of digestive organs: Secondary | ICD-10-CM | POA: Insufficient documentation

## 2016-06-02 DIAGNOSIS — R32 Unspecified urinary incontinence: Secondary | ICD-10-CM | POA: Diagnosis not present

## 2016-06-02 DIAGNOSIS — Z86010 Personal history of colonic polyps: Secondary | ICD-10-CM | POA: Diagnosis not present

## 2016-06-02 DIAGNOSIS — K760 Fatty (change of) liver, not elsewhere classified: Secondary | ICD-10-CM | POA: Insufficient documentation

## 2016-06-02 DIAGNOSIS — R269 Unspecified abnormalities of gait and mobility: Secondary | ICD-10-CM | POA: Diagnosis not present

## 2016-06-02 DIAGNOSIS — Z8673 Personal history of transient ischemic attack (TIA), and cerebral infarction without residual deficits: Secondary | ICD-10-CM | POA: Insufficient documentation

## 2016-06-02 DIAGNOSIS — K7460 Unspecified cirrhosis of liver: Secondary | ICD-10-CM | POA: Diagnosis not present

## 2016-06-02 DIAGNOSIS — M2391 Unspecified internal derangement of right knee: Secondary | ICD-10-CM | POA: Diagnosis not present

## 2016-06-02 NOTE — Progress Notes (Signed)
[facility] day: 06/02/2016   Chief Complaint: Stephanie [name] is a 65 y.o. female with chronic thrombocytopenia and iron deficiency anemia who is seen for 2 month assessment.  HPI: The patient was last seen in the medical oncology clinic on 01/29/2016.  At that time, she remained fatigued.  Hematocrit was 34.2 and platelet count was 98,000.  Ferritin had improved from 15 to 46.  We discussed continuation of oral iron.  She was seen in the ER on 05/31/2016 following a fall.  She has diagnosed with a hip contusion.  CBC revealed a hematocrit of 34, hemoglobin 11.5, MCV 92.6, platelets 92,000, WBC 5700 with an ANC of 4000.  [facility] labs on 06/01/2016 revealed a hematocrit 35.7, hemoglobin 11.4, MCV 96, platelets 94,000, white count 4300 with an ANC of 3000. Ferritin was 79.  She taking one iron pill a day. She is eating well.  She still has some ice pica.  She has fallen secondary to being "unabalanced".  She is being scheduled for arthroscopic surgery on her right knee.  She denies any bruising or bleeding.   Past Medical History:  Diagnosis Date  . Adenomatous colon polyp 08/27/2014   Polyps x 3  . Anemia   . Anxiety   . 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 Arthritis   . Asthma   . Bipolar affective disorder ([employer])    h/o  . Blood transfusion 2000  . Blood transfusion without reported diagnosis   . Chronic abdominal pain    abdominal wall pain  . 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 Colon polyp   . Depression   . Diabetes mellitus without complication ([place])   . Fatty liver 04/09/08   found in abd CT  . GERD (gastroesophageal reflux disease)   . H/O: CVA (cardiovascular accident)    TIA  . H/O: rheumatic fever   . Hyperlipidemia   . Incontinence   . Insomnia   . Obstructive sleep apnea    not using CPAP, last sleep study 2.5 years ago, per pt doctor is aware  . Platelets decreased ([place])   . RLS (restless legs syndrome)   .  Shortness of breath   . Sleep apnea   . Stroke [facility])    TIA- 2002  . Ulcer ([place])   . Unspecified disorders of nervous system     Past Surgical History:  Procedure Laterality Date  . ABDOMINAL EXPLORATION SURGERY    . ABDOMINAL HYSTERECTOMY     total  . Axillary artery cannulation via 8-mm Hemashield graft, median sternotomy, extracorporeal circulation with deep hypothermic circulatory arrest, repair of  aortic dessection  01/23/2009   Dr Arlyce Dice  . BACK SURGERY     x 45  . cataract     both eyes  . CHOLECYSTECTOMY    . COLONOSCOPY    . EYE SURGERY     bilat  . HEMORROIDECTOMY     and colon polyp removed  . LIVER BIOPSY  04/10/2012   Procedure: LIVER BIOPSY;  Surgeon: Inda Castle, MD;  Location: [facility];  Service: Endoscopy;  Laterality: N/A;  ultrasound to mark order for abd limited/liver to be marked to be sent by [email or web address]   . LUMBAR FUSION  10/09  . LUMBAR WOUND DEBRIDEMENT  05/09/2011   Procedure: LUMBAR WOUND DEBRIDEMENT;  Surgeon: Dahlia Bailiff;  Location: [facility];  Service: Orthopedics;  Laterality: N/A;  IRRIGATION AND DEBRIDEMENT  SPINAL WOUND  . POSTERIOR CERVICAL FUSION/FORAMINOTOMY    . ROTATOR CUFF REPAIR     left    Family History  Problem Relation Age of Onset  . Breast cancer Mother   . Lung cancer Mother   . Stomach cancer Father   . Diabetes      4 aunts, and 1 uncle  . Colon cancer Neg Hx   . Esophageal cancer Neg Hx   . Rectal cancer Neg Hx     Social History:  reports that she quit smoking about 3 years ago. Her smoking use included Cigarettes. She has a 75.00 pack-year smoking history. She has never used smokeless tobacco. She reports that she does not drink alcohol or use drugs.  She is a resident at the [facility].  She is accompanied by a staff member today.  Allergies:  Allergies  Allergen Reactions  . Doxycycline Hives, Swelling and Other (See Comments)    Reaction:  Facial swelling     Current Medications: Current  Outpatient Prescriptions  Medication Sig Dispense Refill  . acetaminophen (TYLENOL) 325 MG tablet Take 650 mg by mouth every 6 (six) hours as needed.    Marland Kitchen albuterol (VENTOLIN HFA) 108 (90 Base) MCG/ACT inhaler Inhale 2 puffs into the lungs every 6 (six) hours as needed for wheezing or shortness of breath. 1 Inhaler 11  . budesonide-formoterol (SYMBICORT) 160-4.5 MCG/ACT inhaler Inhale 2 puffs into the lungs 2 (two) times daily.    Marland Kitchen buPROPion (WELLBUTRIN SR) 150 MG 12 hr tablet Take 150 mg by mouth daily.    . clonazePAM (KLONOPIN) 0.5 MG tablet Take 0.5 mg by mouth 2 (two) times daily.    Marland Kitchen dicyclomine (BENTYL) 10 MG capsule Take 10 mg by mouth 3 (three) times daily.     . ferrous sulfate 325 (65 FE) MG tablet Take 325 mg by mouth 2 (two) times daily.     . furosemide (LASIX) 80 MG tablet Take 80 mg by mouth daily.    Marland Kitchen gabapentin (NEURONTIN) 600 MG tablet Take 600 mg by mouth 2 (two) times daily.     Marland Kitchen HYDROcodone-acetaminophen (NORCO) 5-325 MG tablet Take 1 tablet by mouth every 6 (six) hours as needed for moderate pain. 20 tablet 0  . insulin glargine (LANTUS) 100 UNIT/ML injection Inject 20 Units into the skin at bedtime.    . insulin lispro (HUMALOG) 100 UNIT/ML injection Inject 0-9 Units into the skin 3 (three) times daily before meals. Pt uses as needed per sliding scale:    150-200:  0 units  201-250:  5 units  251-300:  8 units 301-350:  9 units  Greater than 350:  Call MD    . lactulose (CHRONULAC) 10 GM/15ML solution Take 10 g by mouth daily.    Marland Kitchen lamoTRIgine (LAMICTAL) 100 MG tablet Take 100 mg by mouth 2 (two) times daily.    Marland Kitchen lidocaine (LIDODERM) 5 % Place 1 patch onto the skin daily. 15 patch 0  . loperamide (IMODIUM) 2 MG capsule Take 4 mg by mouth as needed for diarrhea or loose stools. *Do not exceed 8 doses in 24 hours*    . LORazepam (ATIVAN) 0.5 MG tablet Take 0.5 mg by mouth daily as needed for anxiety.     . metFORMIN (GLUCOPHAGE) 500 MG tablet Take 500 mg by mouth  daily. Take 2 tablets (1000 mg) by mouth every morning with meal, and take 1 tablet by mouth every evening at 5 pm with meal.    .  metoCLOPramide (REGLAN) 5 MG tablet Take 5 mg by mouth 4 (four) times daily. Before meals and at bedtime    . mirtazapine (REMERON) 15 MG tablet Take 15 mg by mouth at bedtime.    . Multiple Vitamin (MULTIVITAMIN WITH MINERALS) TABS tablet Take 1 tablet by mouth daily with breakfast.     . omeprazole (PRILOSEC) 40 MG capsule Take 40 mg by mouth daily before breakfast.    . oxybutynin (DITROPAN-XL) 5 MG 24 hr tablet Take 5 mg by mouth daily.    Marland Kitchen oxyCODONE (OXY IR/ROXICODONE) 5 MG immediate release tablet Take 5 mg by mouth every 12 (twelve) hours as needed for severe pain.     . pioglitazone (ACTOS) 30 MG tablet Take 30 mg by mouth daily.    . potassium chloride SA (K-DUR,KLOR-CON) 20 MEQ tablet Take 20 mEq by mouth daily. *Note dose*    . pregabalin (LYRICA) 150 MG capsule Take 150 mg by mouth 3 (three) times daily.     Marland Kitchen rOPINIRole (REQUIP) 3 MG tablet Take 3 mg by mouth at bedtime.    . senna (SENOKOT) 8.6 MG tablet Take 1 tablet by mouth 3 (three) times a week. Take on Monday, Wednesday, and Friday. *Hold for loose stools*    . simvastatin (ZOCOR) 10 MG tablet Take 10 mg by mouth at bedtime.     Marland Kitchen spironolactone (ALDACTONE) 25 MG tablet Take 25 mg by mouth daily.    . traMADol (ULTRAM) 50 MG tablet Take 50 mg by mouth every 6 (six) hours as needed for moderate pain.    . vitamin C (ASCORBIC ACID) 500 MG tablet Take 500 mg by mouth 2 (two) times daily.    Marland Kitchen zolpidem (AMBIEN) 5 MG tablet Take 5 mg by mouth at bedtime.      No current facility-administered medications for this visit.     Review of Systems:  GENERAL:  Feels "ok".  No fevers or sweats.  Weight up 4 pounds. PERFORMANCE STATUS (ECOG):  2 HEENT:  No visual changes, runny nose, sore throat, mouth sores or tenderness. Lungs:  Shortness of breath with exertion.  No cough.  No hemoptysis. Cardiac:  No  chest pain, palpitations, orthopnea, or PND. GI: Eating well.  Black stool secondary to iron.  No nausea, vomiting, diarrhea, constipation, or hematochezia. GU:  No urgency, frequency, dysuria, or hematuria. Musculoskeletal:  Right hip bursitis/arthritis.  No back pain.  No muscle tenderness. Extremities:  No pain or swelling. Skin:  No rashes or ulcers. Neuro:  Tingling in feet.  No focal weakness.  No seizures.  No headache, numbness or weakness, balance or coordination issues. Endocrine:  Diabetes.  No thyroid issues, hot flashes or night sweats. Psych:  No mood changes, depression or anxiety. Pain:  No focal pain. Review of systems:  All other systems reviewed and found to be negative.  Physical Exam: Blood pressure 113/63, pulse 79, temperature (!) 95.1 F (35.1 C), temperature source Tympanic, resp. rate 18, weight 233 lb 2 oz (105.7 kg). GENERAL:  Well developed, well nourished, heavyset woman sitting comfortably in a wheelchair in the exam room in no acute distress.   MENTAL STATUS:  Alert and oriented to person, place and time. HEAD:  Short gray hair.  Normocephalic, atraumatic, face symmetric, no Cushingoid features. EYES:  Blue eyes s/p cataract surgery.  Pupils equal round and reactive to light and accomodation.  No conjunctivitis or scleral icterus. ENT:  Oropharynx clear without lesion.  Dentures.  Tongue normal. Mucous membranes  moist.  RESPIRATORY:  Clear to auscultation without rales, wheezes or rhonchi. CARDIOVASCULAR:  Regular rate and rhythm without murmur, rub or gallop. ABDOMEN:  Soft, fully round, with active bowel sounds, and no appreciable hepatosplenomegaly.  No guarding or rebound tenderness.  No masses. SKIN: No rashes, ulcers.  No ecchymosis or petechiae. EXTREMITIES:  Chronic bilateral lower extremity edema.  No palpable cords. LYMPH NODES: No palpable cervical, supraclavicular, axillary or inguinal adenopathy  NEUROLOGICAL: Unremarkable. PSYCH:  Appropriate.     Admission on 05/31/2016, Discharged on 05/31/2016  Component Date Value Ref Range Status  . WBC 05/31/2016 5.7  3.6 - 11.0 K/uL Final  . RBC 05/31/2016 3.67* 3.80 - 5.20 MIL/uL Final  . Hemoglobin 05/31/2016 11.5* 12.0 - 16.0 g/dL Final  . HCT 05/31/2016 34.0* 35.0 - 47.0 % Final  . MCV 05/31/2016 92.6  80.0 - 100.0 fL Final  . MCH 05/31/2016 31.3  26.0 - 34.0 pg Final  . MCHC 05/31/2016 33.8  32.0 - 36.0 g/dL Final  . RDW 05/31/2016 16.8* 11.5 - 14.5 % Final  . Platelets 05/31/2016 92* 150 - 440 K/uL Final  . Neutrophils Relative % 05/31/2016 71  % Final  . Neutro Abs 05/31/2016 4.0  1.4 - 6.5 K/uL Final  . Lymphocytes Relative 05/31/2016 16  % Final  . Lymphs Abs 05/31/2016 0.9* 1.0 - 3.6 K/uL Final  . Monocytes Relative 05/31/2016 10  % Final  . Monocytes Absolute 05/31/2016 0.6  0.2 - 0.9 K/uL Final  . Eosinophils Relative 05/31/2016 2  % Final  . Eosinophils Absolute 05/31/2016 0.1  0 - 0.7 K/uL Final  . Basophils Relative 05/31/2016 1  % Final  . Basophils Absolute 05/31/2016 0.0  0 - 0.1 K/uL Final  . Sodium 05/31/2016 142  135 - 145 mmol/L Final  . Potassium 05/31/2016 3.4* 3.5 - 5.1 mmol/L Final  . Chloride 05/31/2016 104  101 - 111 mmol/L Final  . CO2 05/31/2016 32  22 - 32 mmol/L Final  . Glucose, Bld 05/31/2016 82  65 - 99 mg/dL Final  . BUN 05/31/2016 22* 6 - 20 mg/dL Final  . Creatinine, Ser 05/31/2016 0.92  0.44 - 1.00 mg/dL Final  . Calcium 05/31/2016 9.0  8.9 - 10.3 mg/dL Final  . Total Protein 05/31/2016 6.8  6.5 - 8.1 g/dL Final  . Albumin 05/31/2016 3.4* 3.5 - 5.0 g/dL Final  . AST 05/31/2016 33  15 - 41 U/L Final  . ALT 05/31/2016 23  14 - 54 U/L Final  . Alkaline Phosphatase 05/31/2016 125  38 - 126 U/L Final  . Total Bilirubin 05/31/2016 0.8  0.3 - 1.2 mg/dL Final  . GFR calc non Af Amer 05/31/2016 >60  >60 mL/min Final  . GFR calc Af Amer 05/31/2016 >60  >60 mL/min Final   Comment: (NOTE) The eGFR has been calculated using the CKD EPI  equation. This calculation has not been validated in all clinical situations. eGFR's persistently <60 mL/min signify possible Chronic Kidney Disease.   Georgiann Hahn gap 05/31/2016 6  5 - 15 Final    Assessment:  CORRIE REDER is a 65 y.o. 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 Platelets have ranged between 108,000 and 128,000 without trend. She denies any new medications or herbal products.  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  Work-up on 08/29/2014 revealed a positive rheumatoid factor and ANA (anti-double-stranded DNA of 5- equivocal).  Negative studies included hepatitis B surface antibody and  antigen, hepatitis C testing, and HIV testing. B12, SPEP, and free light chains were normal.  Additional testing on 10/31/2014 revealed negative hepatitis B core antibody and CMV IgM.  Abdominal ultrasound on 06/27/2014 revealed mild splenomegaly (14.2 cm) with moderate cirrhosis and no focal liver lesions. AFP was normal (4) on 04/21/2015.  She denies any alcohol use.  She has a history of recurrent iron deficiency anemia.  Ferritin was 20 on 05/11/2015 and 15 on 11/13/2015. Hematocrit was 29.7 on 11/13/2015.  Last colonoscopy was in 08/2014.  Guaiac cards were negative x 3 in 11/2015.  Diet is good.  She has ice pica associated with her anemia.  Symptomatically, she has issues with her right knee.  Arthroscopic surgery is planned.  Hematocrit is 35.7 and platelet count is 94,000.  Ferritin has improved from 15 to 79.  Plan: 1.  Review [facility] labs.  Discuss continued improvement in hematocrit and iron stores.  Continue oral iron until ferritin is > 100. 2.  No Venofer today. 3.  Continue ferrous sulfate 325 mg po q day with OJ or vitamin C. 4.  [facility] slips (CBC with diff and ferritin) in 2 months and 4 months. 5.  RTC in 4 months for MD assessment and review of [facility] labs performed week  prior.   Lequita Asal, MD  06/02/2016, 2:39 PM

## 2016-06-02 NOTE — Progress Notes (Signed)
Patient has fallen twice in past 2 days.  States she is awaiting orthoscopic knee surgery in the near future.

## 2016-06-08 ENCOUNTER — Ambulatory Visit (INDEPENDENT_AMBULATORY_CARE_PROVIDER_SITE_OTHER): Payer: Medicare Other

## 2016-06-08 ENCOUNTER — Ambulatory Visit (INDEPENDENT_AMBULATORY_CARE_PROVIDER_SITE_OTHER): Payer: Medicare Other | Admitting: Vascular Surgery

## 2016-06-08 ENCOUNTER — Encounter (INDEPENDENT_AMBULATORY_CARE_PROVIDER_SITE_OTHER): Payer: Self-pay | Admitting: Vascular Surgery

## 2016-06-08 VITALS — BP 154/81 | HR 83 | Resp 16 | Ht 65.0 in | Wt 227.0 lb

## 2016-06-08 DIAGNOSIS — I8392 Asymptomatic varicose veins of left lower extremity: Secondary | ICD-10-CM | POA: Diagnosis not present

## 2016-06-08 DIAGNOSIS — M7989 Other specified soft tissue disorders: Secondary | ICD-10-CM

## 2016-06-08 DIAGNOSIS — M79605 Pain in left leg: Secondary | ICD-10-CM | POA: Diagnosis not present

## 2016-06-08 DIAGNOSIS — M79604 Pain in right leg: Secondary | ICD-10-CM

## 2016-06-08 DIAGNOSIS — I890 Lymphedema, not elsewhere classified: Secondary | ICD-10-CM

## 2016-06-09 DIAGNOSIS — E119 Type 2 diabetes mellitus without complications: Secondary | ICD-10-CM | POA: Diagnosis not present

## 2016-06-09 DIAGNOSIS — R269 Unspecified abnormalities of gait and mobility: Secondary | ICD-10-CM | POA: Diagnosis not present

## 2016-06-09 DIAGNOSIS — I10 Essential (primary) hypertension: Secondary | ICD-10-CM | POA: Diagnosis not present

## 2016-06-14 DIAGNOSIS — I890 Lymphedema, not elsewhere classified: Secondary | ICD-10-CM | POA: Insufficient documentation

## 2016-06-14 NOTE — Progress Notes (Signed)
Subjective:    Patient ID: Stephanie [name], female    DOB: [birth year], 66 y.o.   MRN: [id number] Chief Complaint  Patient presents with  . Re-evaluation    Ultrasound follow up   Patient presents to review vascular studies. The patient was last seen on 05/06/16 for evaluation of pain and swelling in her legs. The patient reports long-standing history of numbness, tingling, and discoloration in her feet and lower legs. There was no clear inciting event or causative factor that started the symptoms. This has been going on for years but has been gradually worsening. She has noticed increasing swelling and dry skin on the right leg. She reports a remote history of DVT in the right leg some years ago. She does not have ulceration. She does have a previous history of cellulitis in that leg as well. She denies fever or chills. The patient underwent a right lower extremity venous duplex which was notable for no DVT / SVT or reflux. Over the last month, the patient has been wearing medical grade one compression stockings, elevating her legs and remaining active with minimal improvement in her symptoms. She continues to experience swelling and pain in her right lower extremity which has now started to effect her ability to function on a daily basis.    Review of Systems Constitutional: [] Weight loss  [] Fever  [] Chills Cardiac: [] Chest pain   [] Chest pressure   [x] Palpitations   [] Shortness of breath when laying flat   [] Shortness of breath at rest   [] Shortness of breath with exertion. Vascular:  [] Pain in legs with walking   [x] Pain in legs at rest   [] Pain in legs when laying flat   [] Claudication   [] Pain in feet when walking  [] Pain in feet at rest  [] Pain in feet when laying flat   [x] History of DVT   [x] Phlebitis   [x] Swelling in legs   [] Varicose veins   [] Non-healing ulcers Pulmonary:   [] Uses home oxygen   [] Productive cough   [] Hemoptysis   [] Wheeze  [] COPD   [] Asthma Neurologic:  [] Dizziness   [] Blackouts   [] Seizures   [] History of stroke   [] History of TIA  [] Aphasia   [] Temporary blindness   [] Dysphagia   [] Weakness or numbness in arms   [] Weakness or numbness in legs Musculoskeletal:  [x] Arthritis   [] Joint swelling   [x] Joint pain   [] Low back pain Hematologic:  [] Easy bruising  [] Easy bleeding   [] Hypercoagulable state   [] Anemic  [] Hepatitis Gastrointestinal:  [] Blood in stool   [] Vomiting blood  [] Gastroesophageal reflux/heartburn   [] Abdominal pain Genitourinary:  [] Chronic kidney disease   [] Difficult urination  [] Frequent urination  [] Burning with urination   [] Hematuria Skin:  [] Rashes   [] Ulcers   [] Wounds Psychological:  [x] History of anxiety   []  History of major depression.     Objective:   Physical Exam Gen:  WD/WN, NAD Head: [place]/AT, No temporalis wasting. Prominent temp pulse not noted. Ear/Nose/Throat: Hearing grossly intact, nares w/o erythema or drainage, oropharynx w/o Erythema/Exudate Eyes: Conjunctiva clear, sclera non-icteric  Neck: trachea midline.  No JVD.  Pulmonary:  Good air movement, respirations nonlabored and no use of accessory muscles.  Cardiac: RRR, normal S1, S2 Vascular:  Vessel Right Left  Radial Palpable Palpable  Ulnar Palpable Palpable  Brachial Palpable Palpable  Carotid Palpable, without bruit Palpable, without bruit  Aorta Not palpable N/A  Femoral Palpable Palpable  Popliteal Palpable Palpable  PT Palpable 1+ Palpable  DP Palpable Palpable  Gastrointestinal: soft, non-tender/non-distended. No guarding/reflex. No masses, surgical incisions, or scars. Musculoskeletal: M/S 5/5 throughout.  Extremities without ischemic changes.  No deformity or atrophy. 2+ right lower extremity edema and no appreciable left lower extremity edema. Stasis dermatitis changes are present in the right leg and her mild to moderate Neurologic: Sensation grossly intact in extremities.  Symmetrical.  Speech is fluent. Motor exam as listed  above. Psychiatric: Judgment intact, affect is somewhat flat Dermatologic: No rashes or ulcers noted.  No cellulitis or open wounds. Lymph : No Cervical, Axillary, or Inguinal lymphadenopathy.  BP (!) 154/81 (BP Location: Right Arm)   Pulse 83   Resp 16   Ht 5' 5"  (1.651 m)   Wt 227 lb (103 kg)   BMI 37.77 kg/m   Past Medical History:  Diagnosis Date  . Adenomatous colon polyp 08/27/2014   Polyps x 3  . Anemia   . Anxiety   . 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 Arthritis   . Asthma   . Bipolar affective disorder ([place])    h/o  . Blood transfusion 2000  . Blood transfusion without reported diagnosis   . Chronic abdominal pain    abdominal wall pain  . 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 Colon polyp   . Depression   . Diabetes mellitus without complication ([place])   . Fatty liver 04/09/08   found in abd CT  . GERD (gastroesophageal reflux disease)   . H/O: CVA (cardiovascular accident)    TIA  . H/O: rheumatic fever   . Hyperlipidemia   . Incontinence   . Insomnia   . Obstructive sleep apnea    not using CPAP, last sleep study 2.5 years ago, per pt doctor is aware  . Platelets decreased ([place])   . RLS (restless legs syndrome)   . Shortness of breath   . Sleep apnea   . Stroke [facility])    TIA- 2002  . Ulcer ([place])   . Unspecified disorders of nervous system     Social History   Social History  . Marital status: Single    Spouse name: N/A  . Number of children: 2  . Years of education: N/A   Occupational History  . Disabled Disability   Social History Main Topics  . Smoking status: Former Smoker    Packs/day: 1.50    Years: 50.00    Types: Cigarettes    Quit date: 04/06/2013  . Smokeless tobacco: Never Used  . Alcohol use No  . Drug use: No  . Sexual activity: Not on file   Other Topics Concern  . Not on file   Social History Narrative   1 son, 31+ y/o   Daily caffeine use: 2 cups daily   Does not get regular exercise   Disability due to  bipolar      Has a living will- she is a DNR (she has form at home per pt).    Past Surgical History:  Procedure Laterality Date  . ABDOMINAL EXPLORATION SURGERY    . ABDOMINAL HYSTERECTOMY     total  . Axillary artery cannulation via 8-mm Hemashield graft, median sternotomy, extracorporeal circulation with deep hypothermic circulatory arrest, repair of  aortic dessection  01/23/2009   Dr Arlyce Dice  . BACK SURGERY     x 45  . cataract     both eyes  . CHOLECYSTECTOMY    . COLONOSCOPY    . EYE SURGERY  bilat  . HEMORROIDECTOMY     and colon polyp removed  . LIVER BIOPSY  04/10/2012   Procedure: LIVER BIOPSY;  Surgeon: Inda Castle, MD;  Location: [facility];  Service: Endoscopy;  Laterality: N/A;  ultrasound to mark order for abd limited/liver to be marked to be sent by [email or web address]   . LUMBAR FUSION  10/09  . LUMBAR WOUND DEBRIDEMENT  05/09/2011   Procedure: LUMBAR WOUND DEBRIDEMENT;  Surgeon: Dahlia Bailiff;  Location: [facility];  Service: Orthopedics;  Laterality: N/A;  IRRIGATION AND DEBRIDEMENT SPINAL WOUND  . POSTERIOR CERVICAL FUSION/FORAMINOTOMY    . ROTATOR CUFF REPAIR     left    Family History  Problem Relation Age of Onset  . Breast cancer Mother   . Lung cancer Mother   . Stomach cancer Father   . Diabetes      4 aunts, and 1 uncle  . Colon cancer Neg Hx   . Esophageal cancer Neg Hx   . Rectal cancer Neg Hx     Allergies  Allergen Reactions  . Doxycycline Hives, Swelling and Other (See Comments)    Reaction:  Facial swelling        Assessment & Plan:  Patient presents to review vascular studies. The patient was last seen on 05/06/16 for evaluation of pain and swelling in her legs. The patient reports long-standing history of numbness, tingling, and discoloration in her feet and lower legs. There was no clear inciting event or causative factor that started the symptoms. This has been going on for years but has been gradually worsening. She has noticed  increasing swelling and dry skin on the right leg. She reports a remote history of DVT in the right leg some years ago. She does not have ulceration. She does have a previous history of cellulitis in that leg as well. She denies fever or chills. The patient underwent a right lower extremity venous duplex which was notable for no DVT / SVT or reflux. Over the last month, the patient has been wearing medical grade one compression stockings, elevating her legs and remaining active with minimal improvement in her symptoms. She continues to experience swelling and pain in her right lower extremity which has now started to effect her ability to function on a daily basis.   1. Lymphedema - Worsening Despite conservative treatments including exercise, elevation and class one compression stockings, the patient still presents with stage 2 lymphedema. The patient would greatly benefit from additional therapy using a lymphedema pump.   2. Varicose veins of left lower extremity - Stable  Continue compression at this time. The patient would benefit from additional therapy using a lymphedema pump.  3. Pain in both lower extremities Contributing factor is Lymphedema.  Awaiting insurance approval for lymphedema pump.   Current Outpatient Prescriptions on File Prior to Visit  Medication Sig Dispense Refill  . acetaminophen (TYLENOL) 325 MG tablet Take 650 mg by mouth every 6 (six) hours as needed.    Marland Kitchen albuterol (VENTOLIN HFA) 108 (90 Base) MCG/ACT inhaler Inhale 2 puffs into the lungs every 6 (six) hours as needed for wheezing or shortness of breath. 1 Inhaler 11  . budesonide-formoterol (SYMBICORT) 160-4.5 MCG/ACT inhaler Inhale 2 puffs into the lungs 2 (two) times daily.    Marland Kitchen buPROPion (WELLBUTRIN SR) 150 MG 12 hr tablet Take 150 mg by mouth daily.    . clonazePAM (KLONOPIN) 0.5 MG tablet Take 0.5 mg by mouth 2 (two) times daily.    Marland Kitchen dicyclomine (  BENTYL) 10 MG capsule Take 10 mg by mouth 3 (three) times  daily.     . ferrous sulfate 325 (65 FE) MG tablet Take 325 mg by mouth 2 (two) times daily.     . furosemide (LASIX) 80 MG tablet Take 80 mg by mouth daily.    Marland Kitchen gabapentin (NEURONTIN) 600 MG tablet Take 600 mg by mouth 2 (two) times daily.     Marland Kitchen HYDROcodone-acetaminophen (NORCO) 5-325 MG tablet Take 1 tablet by mouth every 6 (six) hours as needed for moderate pain. 20 tablet 0  . insulin glargine (LANTUS) 100 UNIT/ML injection Inject 20 Units into the skin at bedtime.    . insulin lispro (HUMALOG) 100 UNIT/ML injection Inject 0-9 Units into the skin 3 (three) times daily before meals. Pt uses as needed per sliding scale:    150-200:  0 units  201-250:  5 units  251-300:  8 units 301-350:  9 units  Greater than 350:  Call MD    . lactulose (CHRONULAC) 10 GM/15ML solution Take 10 g by mouth daily.    Marland Kitchen lamoTRIgine (LAMICTAL) 100 MG tablet Take 100 mg by mouth 2 (two) times daily.    Marland Kitchen lidocaine (LIDODERM) 5 % Place 1 patch onto the skin daily. 15 patch 0  . loperamide (IMODIUM) 2 MG capsule Take 4 mg by mouth as needed for diarrhea or loose stools. *Do not exceed 8 doses in 24 hours*    . LORazepam (ATIVAN) 0.5 MG tablet Take 0.5 mg by mouth daily as needed for anxiety.     . metFORMIN (GLUCOPHAGE) 500 MG tablet Take 500 mg by mouth daily. Take 2 tablets (1000 mg) by mouth every morning with meal, and take 1 tablet by mouth every evening at 5 pm with meal.    . metoCLOPramide (REGLAN) 5 MG tablet Take 5 mg by mouth 4 (four) times daily. Before meals and at bedtime    . mirtazapine (REMERON) 15 MG tablet Take 15 mg by mouth at bedtime.    . Multiple Vitamin (MULTIVITAMIN WITH MINERALS) TABS tablet Take 1 tablet by mouth daily with breakfast.     . omeprazole (PRILOSEC) 40 MG capsule Take 40 mg by mouth daily before breakfast.    . oxybutynin (DITROPAN-XL) 5 MG 24 hr tablet Take 5 mg by mouth daily.    Marland Kitchen oxyCODONE (OXY IR/ROXICODONE) 5 MG immediate release tablet Take 5 mg by mouth every 12  (twelve) hours as needed for severe pain.     . pioglitazone (ACTOS) 30 MG tablet Take 30 mg by mouth daily.    . potassium chloride SA (K-DUR,KLOR-CON) 20 MEQ tablet Take 20 mEq by mouth daily. *Note dose*    . pregabalin (LYRICA) 150 MG capsule Take 150 mg by mouth 3 (three) times daily.     Marland Kitchen rOPINIRole (REQUIP) 3 MG tablet Take 3 mg by mouth at bedtime.    . senna (SENOKOT) 8.6 MG tablet Take 1 tablet by mouth 3 (three) times a week. Take on Monday, Wednesday, and Friday. *Hold for loose stools*    . simvastatin (ZOCOR) 10 MG tablet Take 10 mg by mouth at bedtime.     Marland Kitchen spironolactone (ALDACTONE) 25 MG tablet Take 25 mg by mouth daily.    . traMADol (ULTRAM) 50 MG tablet Take 50 mg by mouth every 6 (six) hours as needed for moderate pain.    . vitamin C (ASCORBIC ACID) 500 MG tablet Take 500 mg by mouth 2 (two) times daily.    Marland Kitchen  zolpidem (AMBIEN) 5 MG tablet Take 5 mg by mouth at bedtime.      No current facility-administered medications on file prior to visit.     There are no Patient Instructions on file for this visit. No Follow-up on file.   Kinslie Hove A Ellarae Nevitt, PA-C

## 2016-06-16 DIAGNOSIS — G8929 Other chronic pain: Secondary | ICD-10-CM | POA: Diagnosis not present

## 2016-06-16 DIAGNOSIS — G629 Polyneuropathy, unspecified: Secondary | ICD-10-CM | POA: Diagnosis not present

## 2016-06-16 DIAGNOSIS — R600 Localized edema: Secondary | ICD-10-CM | POA: Diagnosis not present

## 2016-06-16 DIAGNOSIS — I10 Essential (primary) hypertension: Secondary | ICD-10-CM | POA: Diagnosis not present

## 2016-06-16 DIAGNOSIS — R269 Unspecified abnormalities of gait and mobility: Secondary | ICD-10-CM | POA: Diagnosis not present

## 2016-06-20 DIAGNOSIS — I6523 Occlusion and stenosis of bilateral carotid arteries: Secondary | ICD-10-CM | POA: Diagnosis not present

## 2016-06-20 DIAGNOSIS — IMO0001 Reserved for inherently not codable concepts without codable children: Secondary | ICD-10-CM | POA: Insufficient documentation

## 2016-06-20 DIAGNOSIS — E1065 Type 1 diabetes mellitus with hyperglycemia: Secondary | ICD-10-CM | POA: Diagnosis not present

## 2016-06-20 DIAGNOSIS — E782 Mixed hyperlipidemia: Secondary | ICD-10-CM | POA: Diagnosis not present

## 2016-06-20 DIAGNOSIS — K219 Gastro-esophageal reflux disease without esophagitis: Secondary | ICD-10-CM | POA: Diagnosis not present

## 2016-06-21 DIAGNOSIS — Z79899 Other long term (current) drug therapy: Secondary | ICD-10-CM | POA: Diagnosis not present

## 2016-06-21 DIAGNOSIS — E119 Type 2 diabetes mellitus without complications: Secondary | ICD-10-CM | POA: Diagnosis not present

## 2016-06-27 DIAGNOSIS — M79673 Pain in unspecified foot: Secondary | ICD-10-CM | POA: Diagnosis not present

## 2016-06-27 DIAGNOSIS — B070 Plantar wart: Secondary | ICD-10-CM | POA: Diagnosis not present

## 2016-06-27 DIAGNOSIS — B351 Tinea unguium: Secondary | ICD-10-CM | POA: Diagnosis not present

## 2016-06-27 DIAGNOSIS — L851 Acquired keratosis [keratoderma] palmaris et plantaris: Secondary | ICD-10-CM | POA: Diagnosis not present

## 2016-06-28 ENCOUNTER — Ambulatory Visit
Admission: RE | Admit: 2016-06-28 | Discharge: 2016-06-28 | Disposition: A | Discharge status: Home / Self Care | Payer: Medicare Other | Source: Ambulatory Visit | Attending: Thoracic Surgery (Cardiothoracic Vascular Surgery) | Admitting: Thoracic Surgery (Cardiothoracic Vascular Surgery)

## 2016-06-28 ENCOUNTER — Ambulatory Visit (INDEPENDENT_AMBULATORY_CARE_PROVIDER_SITE_OTHER): Payer: Medicare Other | Admitting: Thoracic Surgery (Cardiothoracic Vascular Surgery)

## 2016-06-28 ENCOUNTER — Encounter: Payer: Self-pay | Admitting: Thoracic Surgery (Cardiothoracic Vascular Surgery)

## 2016-06-28 VITALS — BP 115/67 | HR 79 | Resp 18 | Ht 65.0 in | Wt 227.0 lb

## 2016-06-28 DIAGNOSIS — R911 Solitary pulmonary nodule: Secondary | ICD-10-CM | POA: Diagnosis not present

## 2016-06-28 DIAGNOSIS — R918 Other nonspecific abnormal finding of lung field: Secondary | ICD-10-CM | POA: Diagnosis not present

## 2016-06-28 NOTE — Progress Notes (Signed)
Stephanie [name]       Stephanie [name]             [phone]    HPI: [name] returns for an annual follow up.  She is a 66 year old woman with multiple medical problems including cirrhosis, ascites, COPD, sleep apnea, and bipolar disorder. She also has a history of heavy tobacco abuse smoking about one half packs of cigarettes daily for 50 years before quitting in 2014.  She was found to have a small right apical lung nodule in October of 2011. It was followed by Dr. Arlyce [name] before he retired. He recommended one final scan to be done. On that scan there was a slight increase in the size of the groundglass opacity in the right apex to 8 mm, so I continued to follow her for that. She was last seen in the office in January 2016. The nodule was unchanged. As it had been 3 years, the nodules felt to be benign. Due to her smoking history she meets criteria for low-dose screening CT. She returns today for that.  She continues to complain of shortness of breath, lack of energy, anxiety and difficulty sleeping. These are long-standing complaints. She recently was having problems with the right hip pain and was evaluated at the [name]. She says that she needs her right knee replaced soon.   Past Medical History:  Diagnosis Date  . Adenomatous colon polyp 08/27/2014   Polyps x 3  . Anemia   . Anxiety   . 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 Arthritis   . Asthma   . Bipolar affective disorder ([facility]ie [name])    h/o  . Blood transfusion 2000  . Blood transfusion without reported diagnosis   . Chronic abdominal pain    abdominal wall pain  . 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 Colon polyp   . Depression   . Diabetes mellitus without complication ([facility]ie [name])   . Fatty liver 04/09/08   found in abd CT  . GERD (gastroesophageal reflux disease)   . H/O: CVA (cardiovascular accident)    TIA  . H/O: rheumatic fever   . Hyperlipidemia   . Incontinence   . Insomnia   .  Obstructive sleep apnea    not using CPAP, last sleep study 2.5 years ago, per pt doctor is aware  . Platelets decreased (Stephanie [name])   . RLS (restless legs syndrome)   . Shortness of breath   . Sleep apnea   . Stroke [name])    TIA- 2002  . Ulcer (Stephanie [name])   . Unspecified disorders of nervous system       Current Outpatient Prescriptions  Medication Sig Dispense Refill  . acetaminophen (TYLENOL) 325 MG tablet Take 650 mg by mouth every 6 (six) hours as needed.    Marland Kitchen albuterol (VENTOLIN HFA) 108 (90 Base) MCG/ACT inhaler Inhale 2 puffs into the lungs every 6 (six) hours as needed for wheezing or shortness of breath. 1 Inhaler 11  . budesonide-formoterol (SYMBICORT) 160-4.5 MCG/ACT inhaler Inhale 2 puffs into the lungs 2 (two) times daily.    Marland Kitchen buPROPion (WELLBUTRIN SR) 150 MG 12 hr tablet Take 150 mg by mouth daily.    . clonazePAM (KLONOPIN) 0.5 MG tablet Take 0.5 mg by mouth 2 (two) times daily.    Marland Kitchen dicyclomine (BENTYL) 10 MG capsule Take 10 mg by mouth 3 (three) times daily.     . ferrous  sulfate 325 (65 FE) MG tablet Take 325 mg by mouth 2 (two) times daily.     . furosemide (LASIX) 80 MG tablet Take 80 mg by mouth daily.    Marland Kitchen gabapentin (NEURONTIN) 600 MG tablet Take 600 mg by mouth 2 (two) times daily.     Marland Kitchen HYDROcodone-acetaminophen (NORCO) 5-325 MG tablet Take 1 tablet by mouth every 6 (six) hours as needed for moderate pain. 20 tablet 0  . insulin glargine (LANTUS) 100 UNIT/ML injection Inject 20 Units into the skin at bedtime.    . insulin lispro (HUMALOG) 100 UNIT/ML injection Inject 0-9 Units into the skin 3 (three) times daily before meals. Pt uses as needed per sliding scale:    150-200:  0 units  201-250:  5 units  251-300:  8 units 301-350:  9 units  Greater than 350:  Call MD    . lactulose (CHRONULAC) 10 GM/15ML solution Take 10 g by mouth daily.    Marland Kitchen lamoTRIgine (LAMICTAL) 100 MG tablet Take 100 mg by mouth 2 (two) times daily.    Marland Kitchen lidocaine (LIDODERM) 5 % Place 1 patch  onto the skin daily. 15 patch 0  . loperamide (IMODIUM) 2 MG capsule Take 4 mg by mouth as needed for diarrhea or loose stools. *Do not exceed 8 doses in 24 hours*    . LORazepam (ATIVAN) 0.5 MG tablet Take 0.5 mg by mouth daily as needed for anxiety.     . metFORMIN (GLUCOPHAGE) 500 MG tablet Take 500 mg by mouth daily. Take 2 tablets (1000 mg) by mouth every morning with meal, and take 1 tablet by mouth every evening at 5 pm with meal.    . metoCLOPramide (REGLAN) 5 MG tablet Take 5 mg by mouth 4 (four) times daily. Before meals and at bedtime    . mirtazapine (REMERON) 15 MG tablet Take 15 mg by mouth at bedtime.    . Multiple Vitamin (MULTIVITAMIN WITH MINERALS) TABS tablet Take 1 tablet by mouth daily with breakfast.     . omeprazole (PRILOSEC) 40 MG capsule Take 40 mg by mouth daily before breakfast.    . oxybutynin (DITROPAN-XL) 5 MG 24 hr tablet Take 5 mg by mouth daily.    Marland Kitchen oxyCODONE (OXY IR/ROXICODONE) 5 MG immediate release tablet Take 5 mg by mouth every 12 (twelve) hours as needed for severe pain.     . pioglitazone (ACTOS) 30 MG tablet Take 30 mg by mouth daily.    . potassium chloride SA (K-DUR,KLOR-CON) 20 MEQ tablet Take 20 mEq by mouth daily. *Note dose*    . pregabalin (LYRICA) 150 MG capsule Take 150 mg by mouth 3 (three) times daily.     Marland Kitchen rOPINIRole (REQUIP) 3 MG tablet Take 3 mg by mouth at bedtime.    . senna (SENOKOT) 8.6 MG tablet Take 1 tablet by mouth 3 (three) times a week. Take on Monday, Wednesday, and Friday. *Hold for loose stools*    . simvastatin (ZOCOR) 10 MG tablet Take 10 mg by mouth at bedtime.     Marland Kitchen spironolactone (ALDACTONE) 25 MG tablet Take 25 mg by mouth daily.    . traMADol (ULTRAM) 50 MG tablet Take 50 mg by mouth every 6 (six) hours as needed for moderate pain.    . vitamin C (ASCORBIC ACID) 500 MG tablet Take 500 mg by mouth 2 (two) times daily.    Marland Kitchen zolpidem (AMBIEN) 5 MG tablet Take 5 mg by mouth at bedtime.  No current facility-administered  medications for this visit.     Physical Exam BP 115/67   Pulse 79   Resp 18   Ht 5' 5"  (1.651 m)   Wt 227 lb (103 kg)   SpO2 93% Comment: RA  BMI 37.11 kg/m  66 year old obese woman in no acute distress Alert and oriented 3 No cervical or supraclavicular adenopathy Cardiac regular rate and rhythm normal S1 and S2 Lungs diminished equal bilaterally  Diagnostic Tests: CT CHEST WITHOUT CONTRAST  TECHNIQUE: Multidetector CT imaging of the chest was performed following the standard protocol without IV contrast.  COMPARISON:  Multiple prior chest CTs. The most recent is 04/04/2016  FINDINGS: Chest wall: No breast masses, supraclavicular or axillary lymphadenopathy. The thyroid gland appears normal.  Cardiovascular: The heart is normal in size. No pericardial effusion. Minimal scattered atherosclerotic calcifications involving the aorta. Three-vessel coronary artery calcifications appear stable.  Mediastinum/Nodes: Small scattered mediastinal and hilar lymph nodes are stable. No mass or overt adenopathy. The esophagus is grossly normal.  Lungs/Pleura: Stable mild emphysematous changes and pulmonary scarring. No infiltrates, edema or effusions. No worrisome pulmonary lesions or definite pulmonary nodules. Right apical density has the appearance of scarring change and measures a maximum of 10 mm on image 18. This appears stable.  Upper Abdomen: Stable advanced cirrhotic changes involving the liver along with portal venous hypertension, portal venous collaterals and splenomegaly. No obvious hepatic mass and no ascites.  Musculoskeletal: No significant bony findings. A spinal cord stimulator is noted in the mid thoracic spine and cervical spine fusion hardware is noted.  IMPRESSION: 1. Stable emphysematous changes and pulmonary scarring. No worrisome pulmonary lesions or acute pulmonary findings. 2. Stable small scattered mediastinal and hilar lymph nodes but  no mass or adenopathy. 3. Stable coronary artery calcifications. 4. Stable cirrhotic changes involving the liver along with portal venous hypertension, portal venous collaterals and splenomegaly.   Electronically Signed   By: Marijo Sanes M.D.   On: 06/28/2016 10:41 I personally reviewed the CT images and concur with the findings noted above.  Impression: Stephanie [name] is a 43 year old woman with a multitude of medical problems and a past history of tobacco abuse. She now has been 2 years without smoking.  Her CT today shows no changes and no suspicious lesions.  She also has coronary artery disease with calcifications and cirrhosis with portal hypertension. CT findings associated with those were stable and unchanged as well.  Plan: Return in 1 year with low-dose CT chest  Melrose Nakayama, MD [facility] [phone]

## 2016-06-30 DIAGNOSIS — R600 Localized edema: Secondary | ICD-10-CM | POA: Diagnosis not present

## 2016-06-30 DIAGNOSIS — I10 Essential (primary) hypertension: Secondary | ICD-10-CM | POA: Diagnosis not present

## 2016-06-30 DIAGNOSIS — R269 Unspecified abnormalities of gait and mobility: Secondary | ICD-10-CM | POA: Diagnosis not present

## 2016-06-30 DIAGNOSIS — E119 Type 2 diabetes mellitus without complications: Secondary | ICD-10-CM | POA: Diagnosis not present

## 2016-07-01 DIAGNOSIS — M1711 Unilateral primary osteoarthritis, right knee: Secondary | ICD-10-CM | POA: Diagnosis not present

## 2016-07-01 DIAGNOSIS — M23203 Derangement of unspecified medial meniscus due to old tear or injury, right knee: Secondary | ICD-10-CM | POA: Insufficient documentation

## 2016-07-07 ENCOUNTER — Telehealth: Payer: Self-pay | Admitting: *Deleted

## 2016-07-07 DIAGNOSIS — R079 Chest pain, unspecified: Secondary | ICD-10-CM | POA: Diagnosis not present

## 2016-07-07 DIAGNOSIS — I10 Essential (primary) hypertension: Secondary | ICD-10-CM | POA: Diagnosis not present

## 2016-07-07 DIAGNOSIS — E119 Type 2 diabetes mellitus without complications: Secondary | ICD-10-CM | POA: Diagnosis not present

## 2016-07-07 NOTE — Telephone Encounter (Signed)
-----   Message from Lequita Asal, MD sent at 07/07/2016  3:23 PM EST ----- Regarding: Please find out about surgery planned  What kind of surgery?  What are they concerned about?  What platelet count needed?  Has she had coags done?  M ----- Message ----- From: Nelida Gores Sent: 07/07/2016  11:16 AM To: Lequita Asal, MD

## 2016-07-08 ENCOUNTER — Telehealth: Payer: Self-pay | Admitting: Pulmonary Disease

## 2016-07-08 IMAGING — CT CT ANGIO CHEST
2 of 6 series · 17 of 46 positions shown · IV contrast (APPLIED)
Comparison: Chest CT 09/01/2015 and chest CT 06/26/2012 and
04/03/2013 and 06/22/2011

CLINICAL DATA: Right chest pain that radiates to the right shoulder
and rib cage.

EXAM:
CT ANGIOGRAPHY CHEST WITH CONTRAST
TECHNIQUE: Multidetector CT imaging of the chest was performed using the
standard protocol during bolus administration of intravenous
contrast. Multiplanar CT image reconstructions and MIPs were
obtained to evaluate the vascular anatomy.
CONTRAST:  100 mL Isovue 370

[Series 5: pe thins 1.5 · axial · 0.68mm/px · z∈[-487,-240]mm · 14 of 232 slices shown]
[im 13/232  lung]
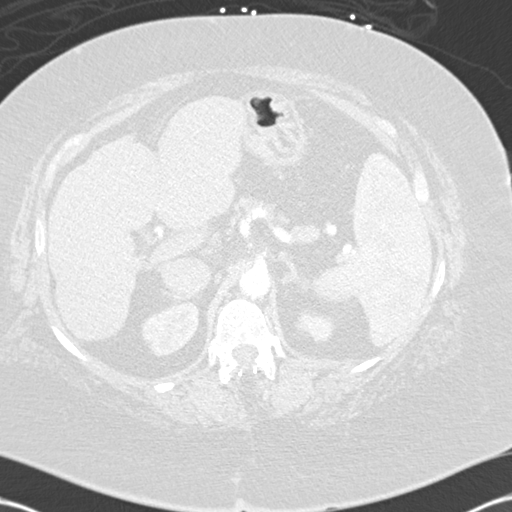
[im 25/232  soft-tissue]
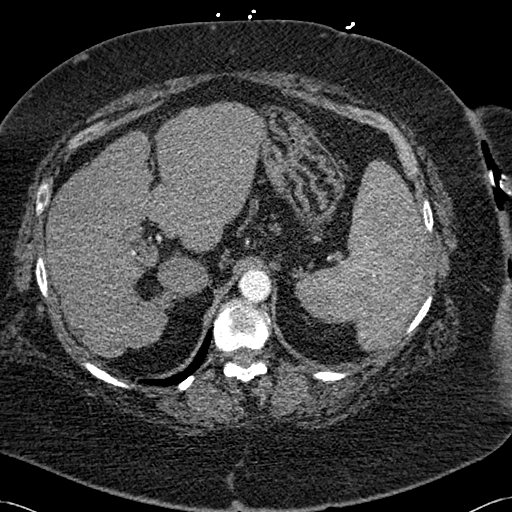
[im 49/232  lung]
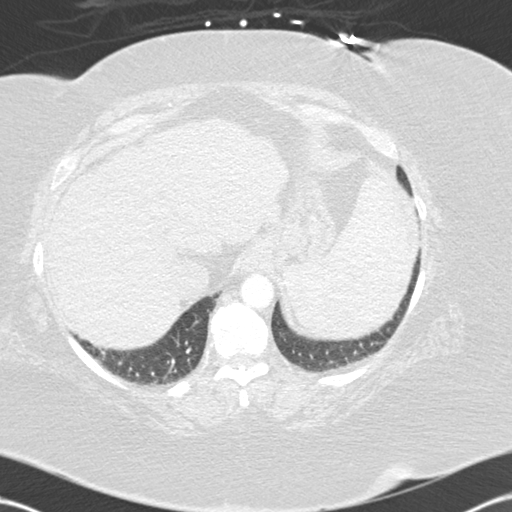
[im 61/232  soft-tissue]
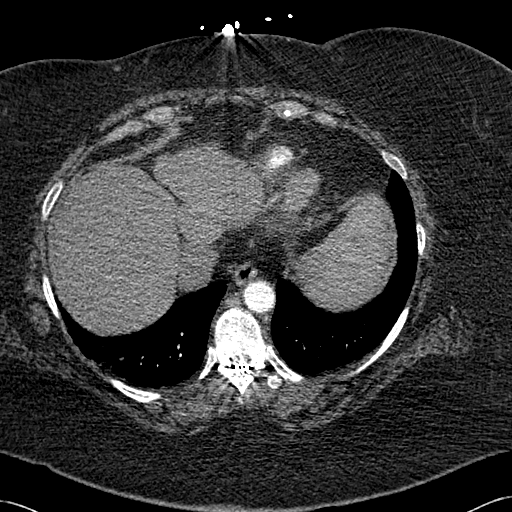
[im 73/232  lung]
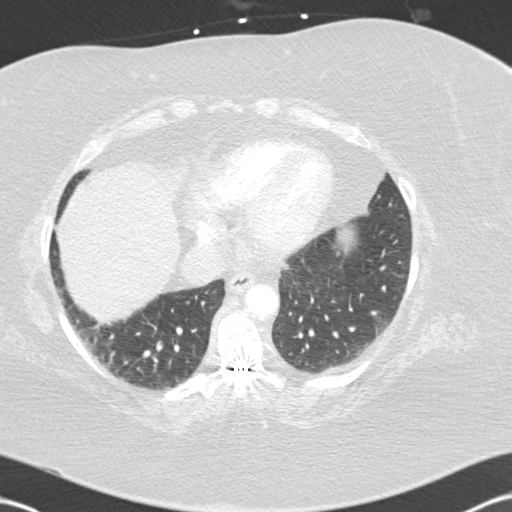
[im 98/232  soft-tissue]
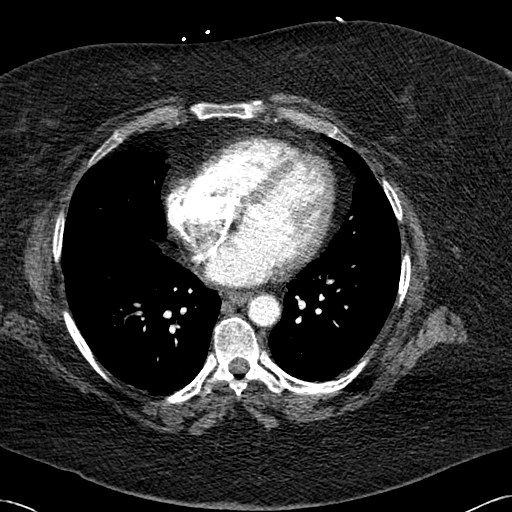
[im 110/232  lung]
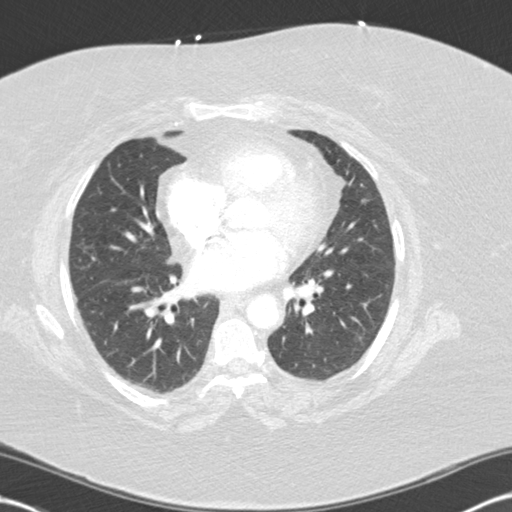
[im 122/232  soft-tissue]
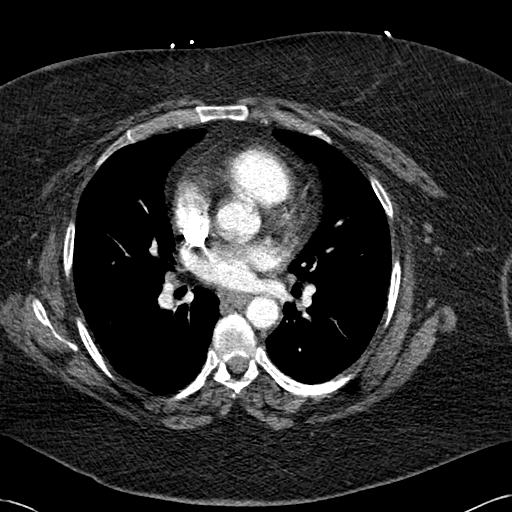
[im 134/232  lung]
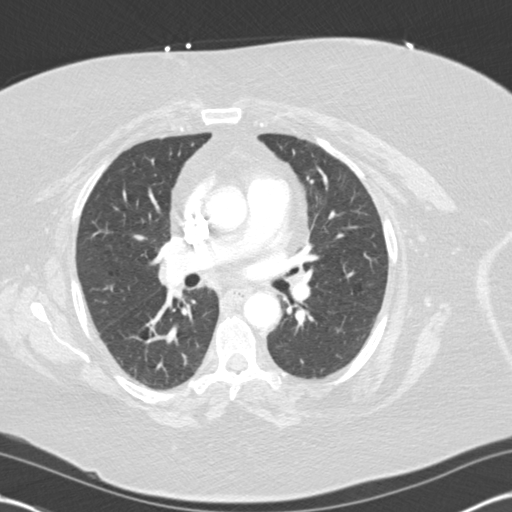
[im 159/232  soft-tissue]
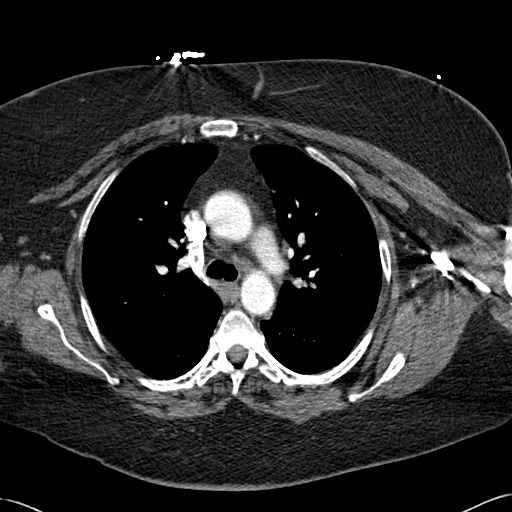
[im 171/232  lung]
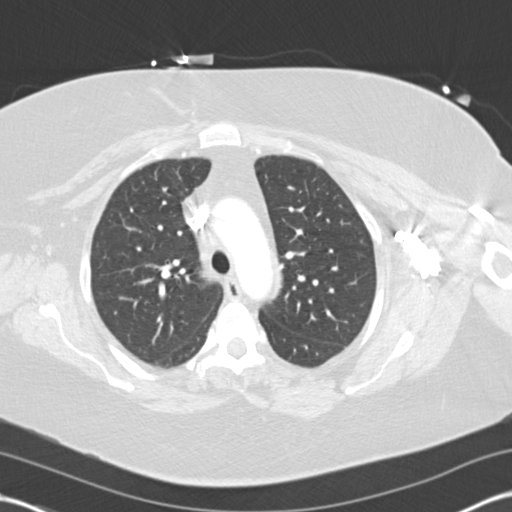
[im 183/232  soft-tissue]
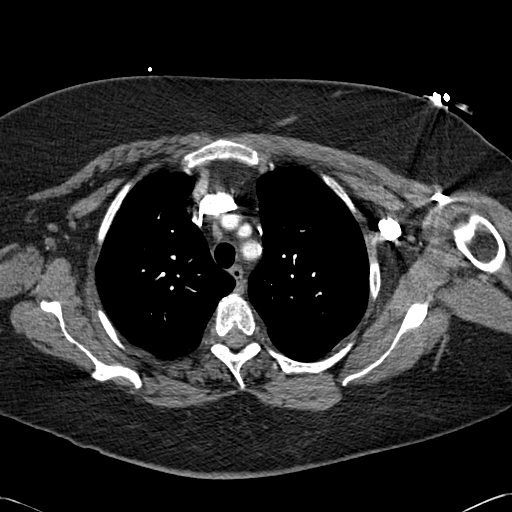
[im 207/232  lung]
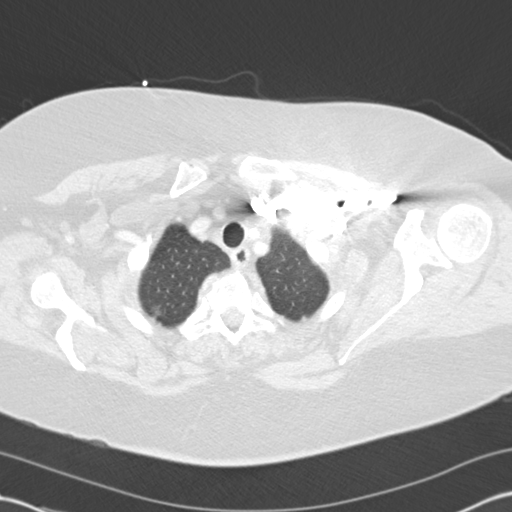
[im 219/232  soft-tissue]
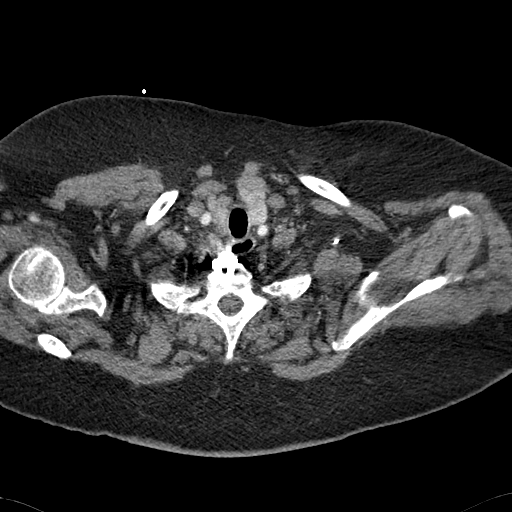

[Series 7: cor mpr 2.0 · coronal · 0.57mm/px · 3 of 154 slices shown]
[im 39/154  soft-tissue]
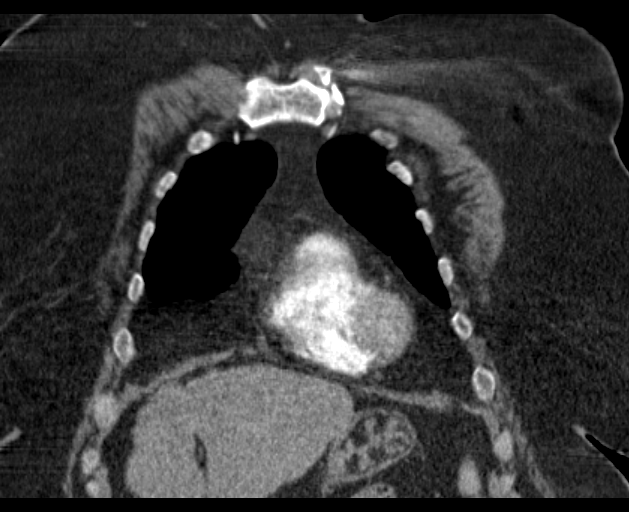
[im 77/154  soft-tissue]
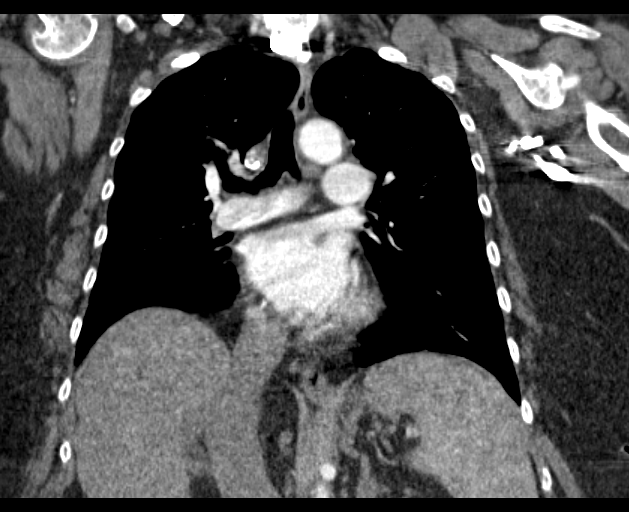
[im 115/154  soft-tissue]
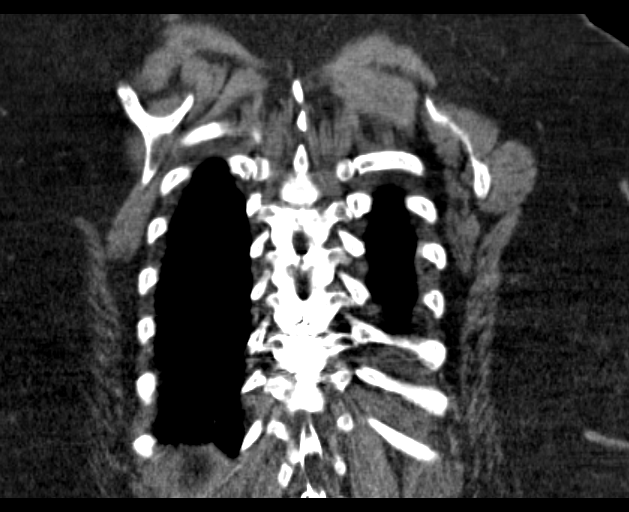

[17 of 46 positions shown; findings below may reference images not displayed]

FINDINGS: Mediastinum/Lymph Nodes: Contrast opacification of the pulmonary
arteries is not optimal and a large amount of the contrast is
already in the thoracic aorta. However, there is no evidence for a
large or central pulmonary embolism. Normal caliber of thoracic
aorta without dissection. There are coronary artery calcifications.
Small mediastinal and hilar lymph nodes are nonspecific. No
significant pericardial fluid. No significant axillary
lymphadenopathy.

Lungs/Pleura: Trachea and mainstem bronchi are patent. Persistent
irregular sub solid density at the right lung apex roughly measures
0.7 cm. This has been present since 2455. Two subtle ground-glass
parenchymal densities in the right middle lobe are actually less
conspicuous than they were on the exam from 09/01/2015. This could
represent resolving inflammation. Again noted are patchy densities
in the lingular region which have not significantly changed from the
most recent comparison examination. Stable patchy densities along
the posterior left upper lobe on sequence 6, image 44 similar to the
recent comparison examination. No new areas of airspace disease or
consolidation. No pleural effusions.

Upper abdomen: Liver has a very nodular contour and suggestive for
cirrhosis. No evidence for ascites in the upper abdomen. Spleen is
prominent but incompletely visualized. Prominent lymph node near the
gastrohepatic ligament measures roughly 1.1 cm on sequence 4, image
108.

Musculoskeletal: Postsurgical changes in the lower thoracic spine
with a spinal stimulator device. Suspect an old fracture involving
the proximal right humerus. Surgical plate in the lower cervical
spine.

Review of the MIP images confirms the above findings.
IMPRESSION: Evaluation for pulmonary emboli is sub optimal. However, there is no
evidence for a large or central pulmonary embolism.

Again noted are patchy densities in both lungs. Some of these
parenchymal densities are less conspicuous from the examination on
09/01/2015, suggesting an inflammatory process. Consider a 3-6 month
follow-up CT to evaluate these residual parenchymal densities. In
addition, there is a relatively stable sub solid density at the
right lung apex that is probably postinflammatory.

Cirrhosis.

## 2016-07-08 IMAGING — CR DG CHEST 2V
2 series · 2 of 2 positions shown · non-contrast
Comparison: Chest radiograph performed 09/09/2015

CLINICAL DATA: Acute onset of right-sided chest pain, radiating to
the right shoulder. Shortness of breath, nausea and dizziness.
Initial encounter.

EXAM:
CHEST  2 VIEW

[chest pa]
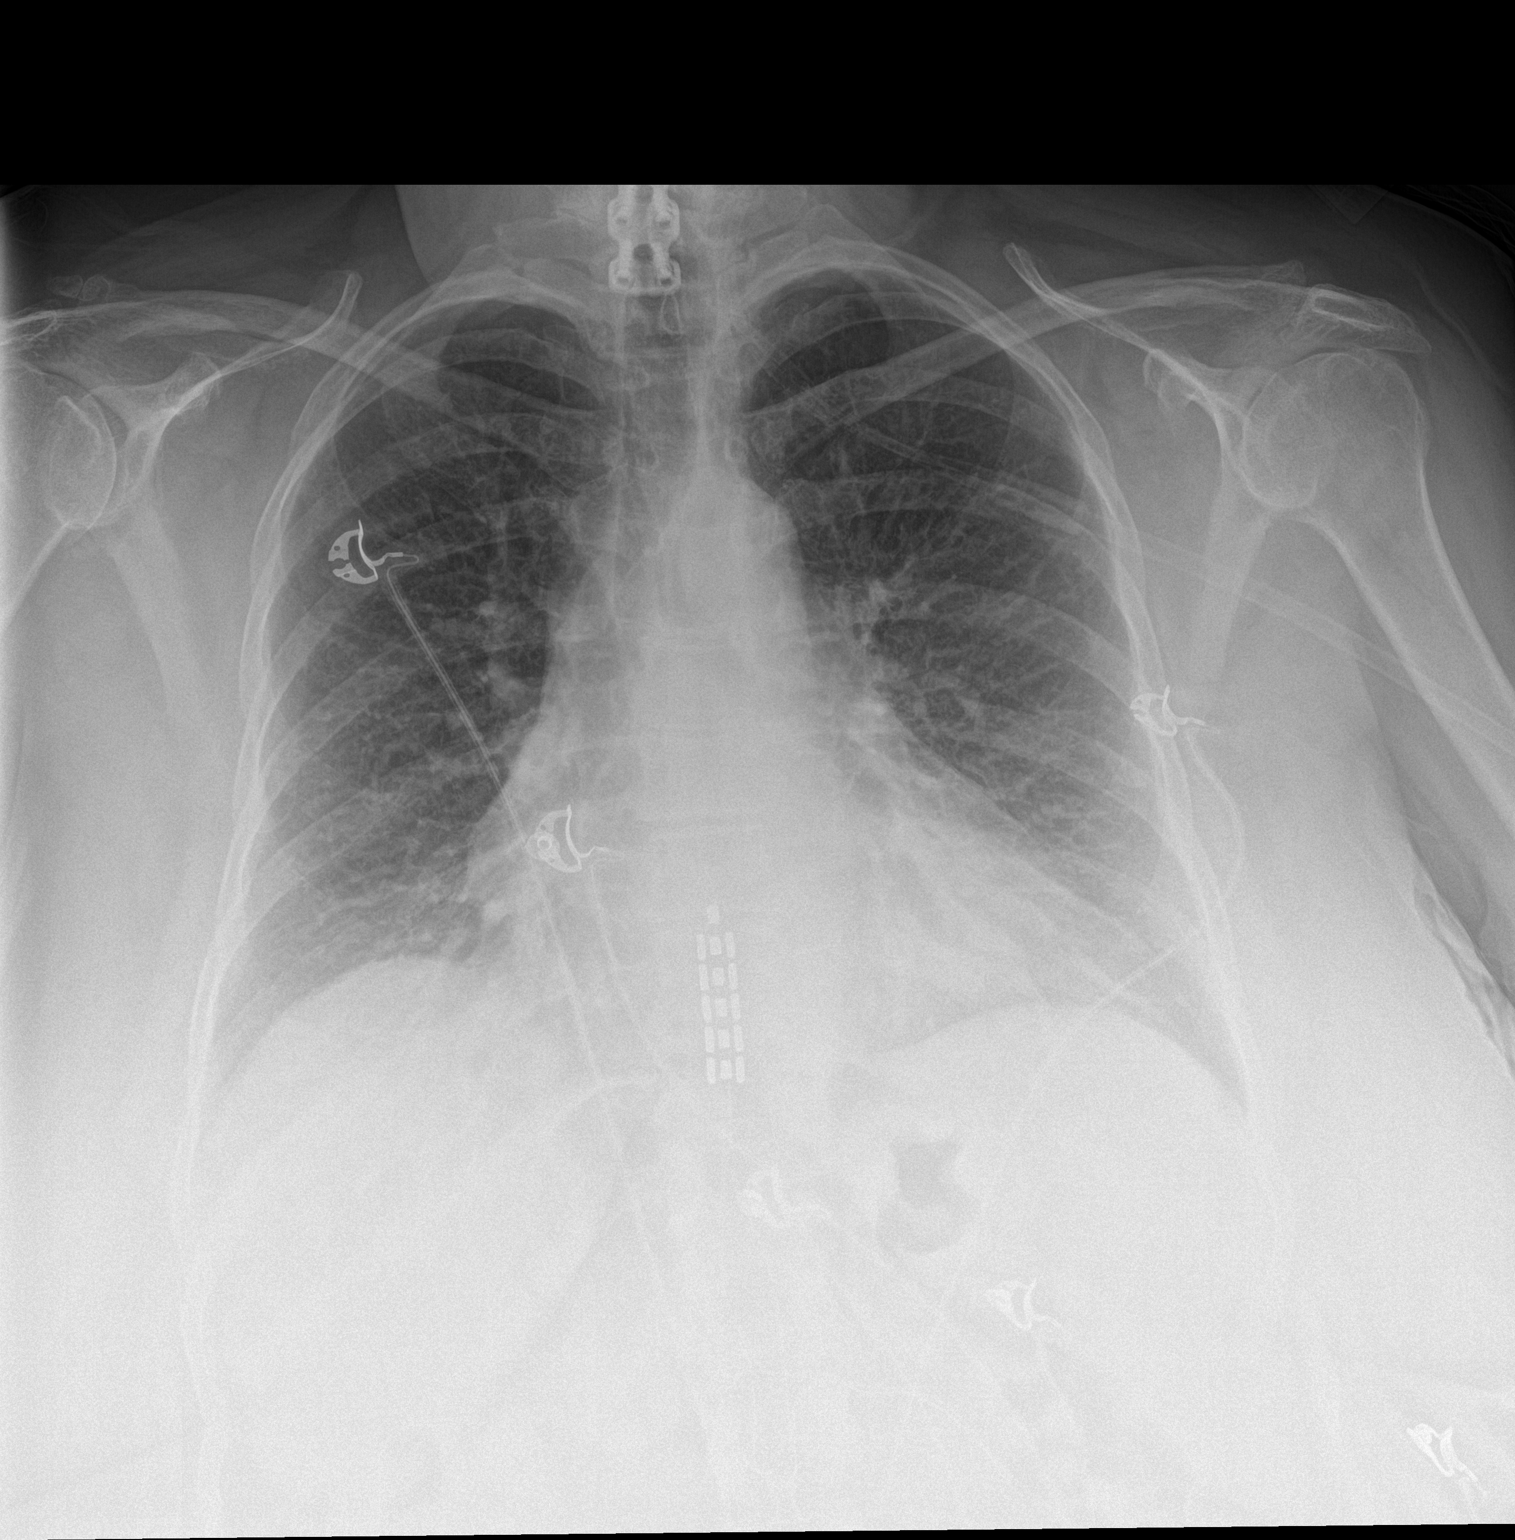

[chest lat]
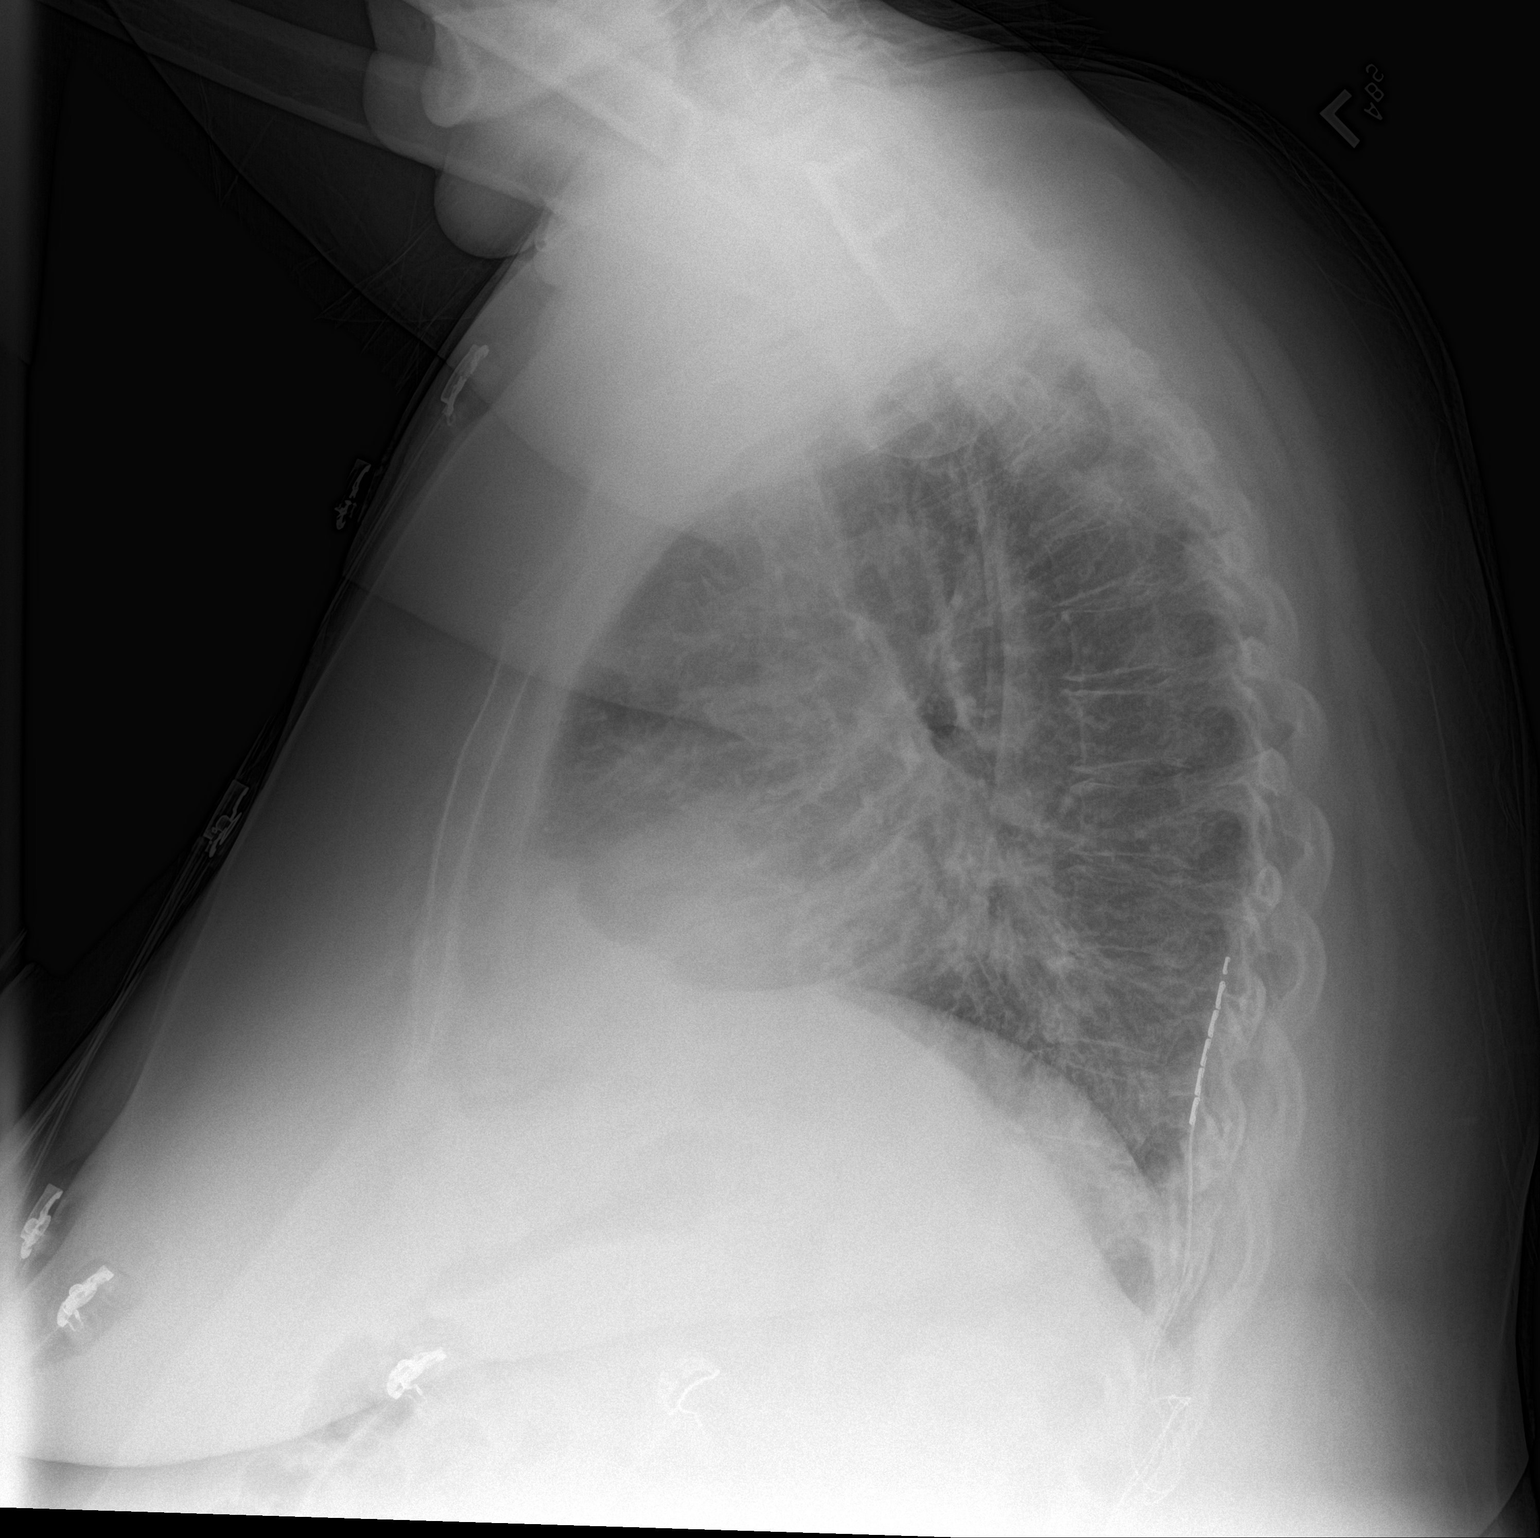

[2 of 2 positions shown; findings below may reference images not displayed]

FINDINGS: The lungs are well-aerated. Vascular congestion is noted. Mild
bibasilar opacities could reflect mild interstitial edema. There is
no evidence of pleural effusion or pneumothorax.

The heart is mildly enlarged. No acute osseous abnormalities are
seen. Cervical spinal fusion hardware is seen. Thoracic spinal
stimulation leads are partially imaged.
IMPRESSION: Vascular congestion and mild cardiomegaly. Mild bibasilar opacities
could reflect mild interstitial edema.

## 2016-07-08 NOTE — Telephone Encounter (Signed)
Spoke with pt, who states she is will be having knee surgery soon, surgery has not been scheduled yet. Pt is requesting surgical clearance. Pt states her breathing is doing well.  VS please advise. Thanks.

## 2016-07-08 NOTE — Telephone Encounter (Signed)
She is due for ROV.  Please schedule ROV with me or NP to f/u on asthma, OSA, and pre op respiratory assessment.

## 2016-07-08 NOTE — Telephone Encounter (Signed)
Spoke with pt. She is aware of VS response. Pt has been scheduled to see VS on 07/12/16 at 9:30am. Nothing further was needed.

## 2016-07-12 ENCOUNTER — Encounter: Payer: Self-pay | Admitting: Pulmonary Disease

## 2016-07-12 ENCOUNTER — Ambulatory Visit (INDEPENDENT_AMBULATORY_CARE_PROVIDER_SITE_OTHER): Payer: Medicare Other | Admitting: Pulmonary Disease

## 2016-07-12 VITALS — BP 130/62 | HR 100 | Ht 63.0 in | Wt 223.0 lb

## 2016-07-12 DIAGNOSIS — Z01811 Encounter for preprocedural respiratory examination: Secondary | ICD-10-CM | POA: Diagnosis not present

## 2016-07-12 DIAGNOSIS — J4530 Mild persistent asthma, uncomplicated: Secondary | ICD-10-CM

## 2016-07-12 DIAGNOSIS — Z9989 Dependence on other enabling machines and devices: Secondary | ICD-10-CM

## 2016-07-12 DIAGNOSIS — G4733 Obstructive sleep apnea (adult) (pediatric): Secondary | ICD-10-CM

## 2016-07-12 NOTE — Progress Notes (Signed)
Current Outpatient Prescriptions on File Prior to Visit  Medication Sig  . acetaminophen (TYLENOL) 325 MG tablet Take 650 mg by mouth every 6 (six) hours as needed.  Marland Kitchen albuterol (VENTOLIN HFA) 108 (90 Base) MCG/ACT inhaler Inhale 2 puffs into the lungs every 6 (six) hours as needed for wheezing or shortness of breath.  . ARIPiprazole (ABILIFY) 30 MG tablet Take 30 mg by mouth daily.  . budesonide-formoterol (SYMBICORT) 160-4.5 MCG/ACT inhaler Inhale 2 puffs into the lungs 2 (two) times daily.  Marland Kitchen buPROPion (WELLBUTRIN SR) 150 MG 12 hr tablet Take 150 mg by mouth daily.  . clonazePAM (KLONOPIN) 0.5 MG tablet Take 0.5 mg by mouth 3 (three) times daily.   Marland Kitchen dicyclomine (BENTYL) 10 MG capsule Take 10 mg by mouth 3 (three) times daily.   . ferrous sulfate 325 (65 FE) MG tablet Take 325 mg by mouth 2 (two) times daily.   . furosemide (LASIX) 80 MG tablet Take 80 mg by mouth daily.  Marland Kitchen gabapentin (NEURONTIN) 600 MG tablet Take 600 mg by mouth 2 (two) times daily.   . insulin glargine (LANTUS) 100 UNIT/ML injection Inject 20 Units into the skin at bedtime.  . insulin lispro (HUMALOG) 100 UNIT/ML injection Inject 0-9 Units into the skin 3 (three) times daily before meals. Pt uses as needed per sliding scale:    150-200:  0 units  201-250:  5 units  251-300:  8 units 301-350:  9 units  Greater than 350:  Call MD  . lactulose (CHRONULAC) 10 GM/15ML solution Take 10 g by mouth 3 (three) times a week.   . lamoTRIgine (LAMICTAL) 100 MG tablet Take 100 mg by mouth 2 (two) times daily.  Marland Kitchen lidocaine (LIDODERM) 5 % Place 1 patch onto the skin daily.  Marland Kitchen loperamide (IMODIUM) 2 MG capsule Take 4 mg by mouth as needed for diarrhea or loose stools. *Do not exceed 8 doses in 24 hours*  . LORazepam (ATIVAN) 0.5 MG tablet Take 0.5 mg by mouth daily as needed for anxiety.   . metFORMIN (GLUCOPHAGE) 500 MG tablet Take 500 mg by mouth daily. Take 2 tablets (1000 mg) by mouth every morning with meal, and take 1 tablet by  mouth every evening at 5 pm with meal.  . metoCLOPramide (REGLAN) 5 MG tablet Take 5 mg by mouth 4 (four) times daily. Before meals and at bedtime  . mirtazapine (REMERON) 15 MG tablet Take 15 mg by mouth at bedtime.  . Multiple Vitamin (MULTIVITAMIN WITH MINERALS) TABS tablet Take 1 tablet by mouth daily with breakfast.   . omeprazole (PRILOSEC) 40 MG capsule Take 40 mg by mouth daily before breakfast.  . oxybutynin (DITROPAN-XL) 5 MG 24 hr tablet Take 5 mg by mouth daily.  Marland Kitchen oxyCODONE (OXY IR/ROXICODONE) 5 MG immediate release tablet Take 5 mg by mouth every 12 (twelve) hours.   . pioglitazone (ACTOS) 30 MG tablet Take 30 mg by mouth daily. At 1 PM  . potassium chloride SA (K-DUR,KLOR-CON) 20 MEQ tablet Take 20 mEq by mouth daily. *Note dose*  . pregabalin (LYRICA) 150 MG capsule Take 150 mg by mouth 3 (three) times daily.   Marland Kitchen rOPINIRole (REQUIP) 3 MG tablet Take 3 mg by mouth at bedtime.  . senna (SENOKOT) 8.6 MG tablet Take 1 tablet by mouth 3 (three) times a week. Take on Monday, Wednesday, and Friday. *Hold for loose stools*  . simvastatin (ZOCOR) 10 MG tablet Take 10 mg by mouth at bedtime.   Marland Kitchen spironolactone (ALDACTONE)  25 MG tablet Take 25 mg by mouth daily.  . traMADol (ULTRAM) 50 MG tablet Take 50 mg by mouth every 12 (twelve) hours as needed for moderate pain.   . vitamin C (ASCORBIC ACID) 500 MG tablet Take 500 mg by mouth 2 (two) times daily.  Marland Kitchen zolpidem (AMBIEN) 5 MG tablet Take 5 mg by mouth at bedtime.    No current facility-administered medications on file prior to visit.     Chief Complaint  Patient presents with  . Follow-up    OSA, knee surgey in 2 weeks, she needs clearance, no complaints or issues at this time    Sleep tests PSG 08/13/13 >> REM AHI 27.9, SaO2 low 82%, Spent 15.1 min with SpO2 < 90%. CPAP 09/06/13 >> CPAP 8 >> AHI 0, + R, + S. PSG 10/18/13 >> AHI 6.9, SaO2 low 82%. Spent 139.2 min with SaO2 < 89%. ONO with CPAP and RA 11/11/13 >> Test time 8 hrs 59  min. Mean SpO2 92%, low SpO2 81%. Spent 9 min with SpO2 < 88% Ferritin 12/13/13 >> 55.9; started ferrous sulfate CPAP 06/11/16 to 07/10/16 >> used on 13 of 30 nights with average 4 hrs 50 min.  Average AHI 6.7 with CPAP 8 cm H2O  Pulmonary tests PFT 02/07/14 >> FEV1 1.94 (66%), FEV1% 91, TLC 3.62 (77%), DLCO 75%, +BD CT chest 06/28/16 >> mild emphysema, coronary calcifications, cirrhosis with portal hypertension, splenomegaly  Cardiac tests Echo 11/14/13 >> EF 55 to 60%  Past medical history Heb B, HLD, Bipolar, Anxiety, Depression, CVA, GERD, Cirrhosis, Type 1 aortic dissection, DM, thrombocytopenia, RLS  She  has a past surgical history that includes Back surgery; Cholecystectomy; Rotator cuff repair; Abdominal hysterectomy; Lumbar fusion (10/09); Hemorroidectomy; Abdominal exploration surgery; Posterior cervical fusion/foraminotomy; Eye surgery; Lumbar wound debridement (05/09/2011); Axillary artery cannulation via 8-mm Hemashield graft, median sternotomy, extracorporeal circulation with deep hypothermic circulatory arrest, repair of  aortic dessection (01/23/2009); cataract; Colonoscopy; and Liver biopsy (04/10/2012).  Her family history includes Breast cancer in her mother; Lung cancer in her mother; Stomach cancer in her father.  She  reports that she quit smoking about 3 years ago. Her smoking use included Cigarettes. She has a 75.00 pack-year smoking history. She has never used smokeless tobacco. She reports that she does not drink alcohol or use drugs.   Allergies  Allergen Reactions  . Doxycycline Hives, Swelling and Other (See Comments)    Reaction:  Facial swelling      Vital signs BP 130/62 (BP Location: Left Arm, Cuff Size: Normal)   Pulse 100   Ht 5' 3"  (1.6 m)   Wt 223 lb (101.2 kg)   SpO2 93%   BMI 39.50 kg/m   History of Present Illness: Stephanie [name] is a 66 y.o. female former smoker with OSA, dyspnea with asthma and deconditioning.  She is being evaluated for  knee plan and is being set up for arthroscopy.  Her breathing has been okay.  She has occasional cough that is usually dry.  This improves after using her inhaler.  She is not having fever, sinus congestion, sore throat, chest pain, or wheeze.  She tries to use her CPAP nightly.  She feels the pressure could be higher, and the mask falls off when she is asleep.    She also notes bruising, and was told her platelet count was low.  Physical Exam:  General - pleasant ENT - MP 4, wears dentures Cardiac - regular, no murmur Chest - no wheeze,  rales Back - no tenderness Abd - soft, non tender Ext - decreased muscle bulk, no edema Neuro - normal strength Skin - several areas of ecchymosis Psych - normal mood, behavior   CMP Latest Ref Rng & Units 05/31/2016 04/20/2016 04/19/2016  Glucose 65 - 99 mg/dL 82 73 95  BUN 6 - 20 mg/dL 22(H) 11 13  Creatinine 0.44 - 1.00 mg/dL 0.92 0.67 0.86  Sodium 135 - 145 mmol/L 142 142 143  Potassium 3.5 - 5.1 mmol/L 3.4(L) 4.2 3.4(L)  Chloride 101 - 111 mmol/L 104 110 105  CO2 22 - 32 mmol/L 32 28 31  Calcium 8.9 - 10.3 mg/dL 9.0 8.5(L) 8.3(L)  Total Protein 6.5 - 8.1 g/dL 6.8 - -  Total Bilirubin 0.3 - 1.2 mg/dL 0.8 - -  Alkaline Phos 38 - 126 U/L 125 - -  AST 15 - 41 U/L 33 - -  ALT 14 - 54 U/L 23 - -    CBC Latest Ref Rng & Units 05/31/2016 04/20/2016 04/19/2016  WBC 3.6 - 11.0 K/uL 5.7 4.8 5.9  Hemoglobin 12.0 - 16.0 g/dL 11.5(L) 11.2(L) 11.0(L)  Hematocrit 35.0 - 47.0 % 34.0(L) 33.4(L) 33.0(L)  Platelets 150 - 440 K/uL 92(L) 105(L) 98(L)    Ct Chest Wo Contrast  Result Date: 06/28/2016 CLINICAL DATA:  Followup pulmonary nodules. EXAM: CT CHEST WITHOUT CONTRAST TECHNIQUE: Multidetector CT imaging of the chest was performed following the standard protocol without IV contrast. COMPARISON:  Multiple prior chest CTs. The most recent is 04/04/2016 FINDINGS: Chest wall: No breast masses, supraclavicular or axillary lymphadenopathy. The thyroid  gland appears normal. Cardiovascular: The heart is normal in size. No pericardial effusion. Minimal scattered atherosclerotic calcifications involving the aorta. Three-vessel coronary artery calcifications appear stable. Mediastinum/Nodes: Small scattered mediastinal and hilar lymph nodes are stable. No mass or overt adenopathy. The esophagus is grossly normal. Lungs/Pleura: Stable mild emphysematous changes and pulmonary scarring. No infiltrates, edema or effusions. No worrisome pulmonary lesions or definite pulmonary nodules. Right apical density has the appearance of scarring change and measures a maximum of 10 mm on image 18. This appears stable. Upper Abdomen: Stable advanced cirrhotic changes involving the liver along with portal venous hypertension, portal venous collaterals and splenomegaly. No obvious hepatic mass and no ascites. Musculoskeletal: No significant bony findings. A spinal cord stimulator is noted in the mid thoracic spine and cervical spine fusion hardware is noted. IMPRESSION: 1. Stable emphysematous changes and pulmonary scarring. No worrisome pulmonary lesions or acute pulmonary findings. 2. Stable small scattered mediastinal and hilar lymph nodes but no mass or adenopathy. 3. Stable coronary artery calcifications. 4. Stable cirrhotic changes involving the liver along with portal venous hypertension, portal venous collaterals and splenomegaly. Electronically Signed   By: Marijo Sanes M.D.   On: 06/28/2016 10:41     Assessment/Plan:  Preoperative respiratory assessment. - she can proceed with knee arthroscopy - she will need to continue her inhaler regimen - she will need to continue CPAP and have close monitoring of her oxygen  Asthma. - continue symbicort and prn albuterol  Obstructive sleep apnea. - will increase CPAP to 9 cm H2O - will have CPAP mask refit   Patient Instructions  Will change your CPAP to 9 cm H2O and refit your CPAP mask  Follow up in 6  months    Chesley Mires, MD  Pulmonary/Critical Care/Sleep Pager:  [phone] 07/12/2016, 9:45 AM

## 2016-07-12 NOTE — Patient Instructions (Signed)
Will change your CPAP to 9 cm H2O and refit your CPAP mask  Follow up in 6 months

## 2016-07-14 ENCOUNTER — Emergency Department: Payer: Medicare Other

## 2016-07-14 ENCOUNTER — Emergency Department
Admission: EM | Admit: 2016-07-14 | Discharge: 2016-07-14 | Disposition: A | Discharge status: Home / Self Care | Payer: Medicare Other | Attending: Emergency Medicine | Admitting: Emergency Medicine

## 2016-07-14 ENCOUNTER — Telehealth: Payer: Self-pay | Admitting: *Deleted

## 2016-07-14 ENCOUNTER — Encounter: Payer: Self-pay | Admitting: Emergency Medicine

## 2016-07-14 DIAGNOSIS — M25551 Pain in right hip: Secondary | ICD-10-CM | POA: Insufficient documentation

## 2016-07-14 DIAGNOSIS — J45909 Unspecified asthma, uncomplicated: Secondary | ICD-10-CM | POA: Diagnosis not present

## 2016-07-14 DIAGNOSIS — M79646 Pain in unspecified finger(s): Secondary | ICD-10-CM | POA: Diagnosis not present

## 2016-07-14 DIAGNOSIS — M546 Pain in thoracic spine: Secondary | ICD-10-CM | POA: Diagnosis not present

## 2016-07-14 DIAGNOSIS — M25569 Pain in unspecified knee: Secondary | ICD-10-CM | POA: Diagnosis not present

## 2016-07-14 DIAGNOSIS — W06XXXA Fall from bed, initial encounter: Secondary | ICD-10-CM | POA: Diagnosis not present

## 2016-07-14 DIAGNOSIS — M25571 Pain in right ankle and joints of right foot: Secondary | ICD-10-CM | POA: Diagnosis not present

## 2016-07-14 DIAGNOSIS — Z87891 Personal history of nicotine dependence: Secondary | ICD-10-CM | POA: Diagnosis not present

## 2016-07-14 DIAGNOSIS — S299XXA Unspecified injury of thorax, initial encounter: Secondary | ICD-10-CM | POA: Diagnosis not present

## 2016-07-14 DIAGNOSIS — M542 Cervicalgia: Secondary | ICD-10-CM | POA: Insufficient documentation

## 2016-07-14 DIAGNOSIS — Z8673 Personal history of transient ischemic attack (TIA), and cerebral infarction without residual deficits: Secondary | ICD-10-CM | POA: Diagnosis not present

## 2016-07-14 DIAGNOSIS — E119 Type 2 diabetes mellitus without complications: Secondary | ICD-10-CM | POA: Diagnosis not present

## 2016-07-14 DIAGNOSIS — S4991XA Unspecified injury of right shoulder and upper arm, initial encounter: Secondary | ICD-10-CM | POA: Diagnosis present

## 2016-07-14 DIAGNOSIS — M25561 Pain in right knee: Secondary | ICD-10-CM | POA: Diagnosis not present

## 2016-07-14 DIAGNOSIS — Y939 Activity, unspecified: Secondary | ICD-10-CM | POA: Insufficient documentation

## 2016-07-14 DIAGNOSIS — G4730 Sleep apnea, unspecified: Secondary | ICD-10-CM | POA: Diagnosis not present

## 2016-07-14 DIAGNOSIS — I10 Essential (primary) hypertension: Secondary | ICD-10-CM | POA: Insufficient documentation

## 2016-07-14 DIAGNOSIS — Y929 Unspecified place or not applicable: Secondary | ICD-10-CM | POA: Insufficient documentation

## 2016-07-14 DIAGNOSIS — W19XXXA Unspecified fall, initial encounter: Secondary | ICD-10-CM | POA: Diagnosis not present

## 2016-07-14 DIAGNOSIS — S3993XA Unspecified injury of pelvis, initial encounter: Secondary | ICD-10-CM | POA: Diagnosis not present

## 2016-07-14 DIAGNOSIS — G3184 Mild cognitive impairment, so stated: Secondary | ICD-10-CM | POA: Diagnosis not present

## 2016-07-14 DIAGNOSIS — S79911A Unspecified injury of right hip, initial encounter: Secondary | ICD-10-CM | POA: Diagnosis not present

## 2016-07-14 DIAGNOSIS — M25511 Pain in right shoulder: Secondary | ICD-10-CM | POA: Diagnosis not present

## 2016-07-14 DIAGNOSIS — Y999 Unspecified external cause status: Secondary | ICD-10-CM | POA: Diagnosis not present

## 2016-07-14 DIAGNOSIS — Z794 Long term (current) use of insulin: Secondary | ICD-10-CM | POA: Insufficient documentation

## 2016-07-14 DIAGNOSIS — S0990XA Unspecified injury of head, initial encounter: Secondary | ICD-10-CM | POA: Diagnosis not present

## 2016-07-14 MED ORDER — OXYCODONE-ACETAMINOPHEN 7.5-325 MG PO TABS
1.0000 | ORAL_TABLET | Freq: Once | ORAL | Status: AC
Start: 2016-07-14 — End: 2016-07-14
  Administered 2016-07-14: 1 via ORAL
  Filled 2016-07-14: qty 1

## 2016-07-14 NOTE — ED Provider Notes (Signed)
[facility] Emergency Department Provider Note  ____________________________________________  Time seen: Approximately 6:56 PM  I have reviewed the triage vital signs and the nursing notes.   HISTORY  Chief Complaint Fall; Knee Pain; and Shoulder Pain    HPI Stephanie [name] is a 66 y.o. female a history of chronic low back pain on OxyContin and tramadol daily presenting with right-sided neck, right shoulder, right knee pain after slipping out of bed at 3 PM. She denies hitting her head and did not lose consciousness. Initially, she did not have significant pain but over the last couple of hours, she has felt increased pain, burning sensation, and stiffness. She has not tried anything for her pain. No numbness tingling or weakness, no urinary or fecal incontinence or retention, no headache, no nausea or vomiting, no chest pain or syncope.   Past Medical History:  Diagnosis Date  . Adenomatous colon polyp 08/27/2014   Polyps x 3  . Anemia   . Anxiety   . 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 Arthritis   . Asthma   . Bipolar affective disorder ([place])    h/o  . Blood transfusion 2000  . Blood transfusion without reported diagnosis   . Chronic abdominal pain    abdominal wall pain  . 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 Colon polyp   . Depression   . Diabetes mellitus without complication ([place])   . Fatty liver 04/09/08   found in abd CT  . GERD (gastroesophageal reflux disease)   . H/O: CVA (cardiovascular accident)    TIA  . H/O: rheumatic fever   . Hyperlipidemia   . Incontinence   . Insomnia   . Obstructive sleep apnea    not using CPAP, last sleep study 2.5 years ago, per pt doctor is aware  . Platelets decreased ([place])   . RLS (restless legs syndrome)   . Shortness of breath   . Sleep apnea   . Stroke [facility])    TIA- 2002  . Ulcer ([place])   . Unspecified disorders of nervous system     Patient Active Problem List   Diagnosis Date Noted   . Lymphedema 06/14/2016  . Pain in limb 05/06/2016  . Swelling of limb 05/06/2016  . Postphlebitic syndrome with inflammation 05/06/2016  . Pneumonia 04/18/2016  . Chest pain 04/04/2016  . Iron deficiency anemia 01/30/2016  . Hypoglycemia 09/09/2015  . Hyperglycemia 07/16/2015  . Neuropathy ([place]) 06/08/2015  . Thrombocytopenia ([place]) 08/19/2014  . Abdominal pain, chronic, epigastric 06/12/2014  . Liver cirrhosis secondary to NASH ([place]) 02/27/2014  . Dizziness and giddiness 10/30/2013  . DOE (dyspnea on exertion) 10/30/2013  . Poorly controlled type 2 diabetes mellitus with complication ([place]) 42/35/3614  . DNR (do not resuscitate) 07/29/2013  . Encounter for routine gynecological examination 07/29/2013  . Dyspnea 07/10/2013  . Lung nodule 07/10/2013  . Varicose veins of left lower extremity 10/09/2012  . GERD (gastroesophageal reflux disease) 10/09/2012  . Cirrhosis of liver ([place]) 02/02/2012  . Anxiety 07/11/2011  . Aortic dissection ([place])   . BACK PAIN, LUMBAR, CHRONIC 11/19/2009  . UNS ADVRS EFF OTH RX MEDICINAL&BIOLOGICAL SBSTNC 12/16/2008  . UNSPECIFIED VITAMIN D DEFICIENCY 09/26/2008  . TOBACCO ABUSE 06/19/2008  . RESTLESS LEGS SYNDROME 04/27/2007  . INSOMNIA 04/27/2007  . HLD (hyperlipidemia) 03/08/2007  . OSA (obstructive sleep apnea) 03/08/2007  . Essential hypertension 03/08/2007  . Asthma 03/08/2007  . BIPOLAR AFFECTIVE DISORDER, HX OF 03/08/2007  Past Surgical History:  Procedure Laterality Date  . ABDOMINAL EXPLORATION SURGERY    . ABDOMINAL HYSTERECTOMY     total  . Axillary artery cannulation via 8-mm Hemashield graft, median sternotomy, extracorporeal circulation with deep hypothermic circulatory arrest, repair of  aortic dessection  01/23/2009   Dr Arlyce Dice  . BACK SURGERY     x 45  . cataract     both eyes  . CHOLECYSTECTOMY    . COLONOSCOPY    . EYE SURGERY     bilat  . HEMORROIDECTOMY     and colon polyp removed  . LIVER BIOPSY  04/10/2012    Procedure: LIVER BIOPSY;  Surgeon: Inda Castle, MD;  Location: [facility];  Service: Endoscopy;  Laterality: N/A;  ultrasound to mark order for abd limited/liver to be marked to be sent by [email or web address]   . LUMBAR FUSION  10/09  . LUMBAR WOUND DEBRIDEMENT  05/09/2011   Procedure: LUMBAR WOUND DEBRIDEMENT;  Surgeon: Dahlia Bailiff;  Location: [place];  Service: Orthopedics;  Laterality: N/A;  IRRIGATION AND DEBRIDEMENT SPINAL WOUND  . POSTERIOR CERVICAL FUSION/FORAMINOTOMY    . ROTATOR CUFF REPAIR     left    Current Outpatient Rx  . Order #: [id number] Class: Historical Med  . Order #: [id number] Class: Normal  . Order #: [id number] Class: Historical Med  . Order #: [id number] Class: Historical Med  . Order #: [id number] Class: Historical Med  . Order #: [id number] Class: Historical Med  . Order #: [id number] Class: Historical Med  . Order #: [id number] Class: Historical Med  . Order #: [id number] Class: Historical Med  . Order #: [id number] Class: Historical Med  . Order #: [id number] Class: Historical Med  . Order #: [id number] Class: Historical Med  . Order #: [id number] Class: Historical Med  . Order #: [id number] Class: Historical Med  . Order #: [id number] Class: Print  . Order #: [id number] Class: Historical Med  . Order #: [id number] Class: Historical Med  . Order #: [id number] Class: Historical Med  . Order #: [id number] Class: Historical Med  . Order #: [id number] Class: Historical Med  . Order #: [id number] Class: Historical Med  . Order #: [id number] Class: Historical Med  . Order #: [id number] Class: Historical Med  . Order #: [id number] Class: Historical Med  . Order #: [id number] Class: Historical Med  . Order #: [id number] Class: Historical Med  . Order #: [id number] Class: Historical Med  . Order #: [id number] Class: Historical Med  . Order #: [id number] Class: Historical Med  . Order #: [id number] Class: Historical Med  . Order #: [id number] Class: Historical Med  . Order #: [id number] Class: Historical Med  . Order  #: [id number] Class: Historical Med  . Order #: [id number] Class: Historical Med    Allergies Doxycycline  Family History  Problem Relation Age of Onset  . Breast cancer Mother   . Lung cancer Mother   . Stomach cancer Father   . Diabetes      4 aunts, and 1 uncle  . Colon cancer Neg Hx   . Esophageal cancer Neg Hx   . Rectal cancer Neg Hx     Social History Social History  Substance Use Topics  . Smoking status: Former Smoker    Packs/day: 1.50    Years: 50.00    Types: Cigarettes    Quit date: 04/06/2013  . Smokeless tobacco: Never Used  . Alcohol use No    Review of Systems Constitutional: No fever/chills.Positive mechanical fall. Negative syncope or loss of consciousness. Eyes: No visual changes.No blurred or double vision. ENT: No sore throat. No congestion or rhinorrhea. Cardiovascular: Denies chest pain. Denies palpitations. Respiratory:  Denies shortness of breath.  No cough. Gastrointestinal: No abdominal pain.  No nausea, no vomiting.  No diarrhea.  No constipation. Genitourinary: Negative for dysuria. Musculoskeletal: As of her chronic low back pain. Positive for new upper back pain. Positive for right lateral neck pain. Positive for right shoulder pain. Positive for right knee pain. Skin: Negative for rash. Neurological: Negative for headaches. No focal numbness, tingling or weakness. Urinary or fecal incontinence or retention. Was unable to ambulate after the fall due to pain.  10-point ROS otherwise negative.  ____________________________________________   PHYSICAL EXAM:  VITAL SIGNS: ED Triage Vitals  Enc Vitals Group     BP 07/14/16 1830 (!) 93/42     Pulse Rate 07/14/16 1830 82     Resp 07/14/16 1830 18     Temp 07/14/16 1830 97.8 F (36.6 C)     Temp Source 07/14/16 1830 Oral     SpO2 07/14/16 1830 94 %     Weight 07/14/16 1837 225 lb (102.1 kg)     Height 07/14/16 1837 5' 3"  (1.6 m)     Head Circumference --      Peak Flow --      Pain  Score 07/14/16 1838 8     Pain Loc --      Pain Edu? --      Excl. in [place]? --     Constitutional: Alert and oriented. Chronically ill appearing but sitting comfortably in the stretcher. Answers questions appropriately. Eyes: Conjunctivae are normal.  EOMI. No scleral icterus. No raccoon eyes. Head: Atraumatic. No Battle sign. Nose: No congestion/rhinnorhea. Swelling over the nose, no septal hematoma. Mouth/Throat: Mucous membranes are moist. No malocclusion or dental injury. Neck: No stridor.  Supple.  Palpable tenderness to palpation in the right lateral neck. Positive midline lower C-spine tenderness without step-offs or deformities. Cardiovascular: Normal rate, regular rhythm. No murmurs, rubs or gallops.  Respiratory: Normal respiratory effort.  No accessory muscle use or retractions. Lungs CTAB.  No wheezes, rales or ronchi. Gastrointestinal: Obese. Soft, nontender and nondistended.  No guarding or rebound.  No peritoneal signs. Musculoskeletal: Positive tenderness to palpation without focality in the entirety of the thoracic spine without step-offs or deformities. Tenderness to palpation along the entirety of the lumbar spine but the patient states this is chronic and unchanged from baseline; no step-offs or deformities. Positive pain with range of motion of the right ankle, right knee, and right hip, right shoulder. No pain with range of motion of the right elbow, right wrist, left elbow, left wrist left shoulder, left hip, knee and ankle. Normal DP and PT pulses bilaterally. Normal sensation to light touch in the upper and lower extremities. Tach. Neurologic:  A&Ox3.  Speech is clear.  Face and smile are symmetric.  EOMI.  Moves all extremities well. Skin:  Skin is warm, dry and intact. No rash noted. Psychiatric: Mood and affect are normal. Speech and behavior are normal.  Normal judgement.  ____________________________________________   LABS (all labs ordered are listed, but only  abnormal results are displayed)  Labs Reviewed - No data to display ____________________________________________  EKG  Not indicated ____________________________________________  RADIOLOGY  Dg Thoracic Spine 2 View  Result Date: 07/14/2016 CLINICAL DATA:  Patient fell off of a chair.  Pain. EXAM: THORACIC SPINE 2 VIEWS COMPARISON:  CT 08/23/2016. FINDINGS: Neural stimulator device projects up to the T8-T9 disc level. No acute fracture of the thoracic spine. Small anterior osteophytes are noted mild disc space narrowing. The patient is  status post low anterior cervical disc fusion from C5 through C7. IMPRESSION: No acute osseous abnormality the thoracic spine. Neural stimulator device projects up to the T8-T9 level. ACDF of the lower cervical spine. Electronically Signed   By: Ashley Royalty M.D.   On: 07/14/2016 19:44   Dg Shoulder Right  Result Date: 07/14/2016 CLINICAL DATA:  Patient fell from chair.  Pain. EXAM: RIGHT SHOULDER - 2+ VIEW COMPARISON:  09/01/2015 radiographs of the right shoulder FINDINGS: Osteoarthritis of the [facility] joint with adjacent ossicles. Osteoarthritic joint space narrowing and sclerosis of the glenohumeral joint without malalignment. No acute displaced fracture or bone destruction. Previously suspected nondisplaced fracture at the base the greater tuberosity is less apparent on current exam. Patient status post C5 through C7 ACDF. Adjacent ribs and lung are nonacute. IMPRESSION: No acute osseous abnormality or malalignment. AC and glenohumeral joint osteoarthritis. ACDF of the cervical spine. Electronically Signed   By: Ashley Royalty M.D.   On: 07/14/2016 20:01   Dg Ankle Complete Right  Result Date: 07/14/2016 CLINICAL DATA:  Right ankle pain after fall from chair EXAM: RIGHT ANKLE - COMPLETE 3+ VIEW COMPARISON:  None. FINDINGS: There is mild soft tissue swelling about the leg, malleoli and dorsum of the midfoot. No ankle joint effusion. The ankle mortise is maintained. The  subtalar joint is unremarkable. Growth arrest lines of the distal tibial diaphysis. There is a small plantar calcaneal enthesophyte. On the lateral view, there is cortical irregularity of a metatarsal bone incompletely included with sclerotic appearing margins suggesting old remote trauma. If the patient is symptomatic over the midfoot, would recommend dedicated foot radiographs for better assessment. IMPRESSION: No acute displaced appearing fracture. Soft tissue swelling of the leg, malleoli and midfoot. On the lateral projection, there is deformity of one of the metarsals with sclerotic appearing margins suggestive of an old remote injury though not confirmed on the additional views. If the patient however is symptomatic over the midfoot, dedicated right foot radiographs are recommended for better assessment. Electronically Signed   By: Ashley Royalty M.D.   On: 07/14/2016 19:57   Ct Head Wo Contrast  Result Date: 07/14/2016 CLINICAL DATA:  Fall.  Fall from chair.  Neck pain. EXAM: CT HEAD WITHOUT CONTRAST CT CERVICAL SPINE WITHOUT CONTRAST TECHNIQUE: Multidetector CT imaging of the head and cervical spine was performed following the standard protocol without intravenous contrast. Multiplanar CT image reconstructions of the cervical spine were also generated. COMPARISON:  Neck CT 09/01/2015 FINDINGS: CT HEAD FINDINGS Brain: No intracranial hemorrhage. No parenchymal contusion. No midline shift or mass effect. Basilar cisterns are patent. No skull base fracture. No fluid in the paranasal sinuses or mastoid air cells. Orbits are normal. Vascular: No hyperdense vessel or unexpected calcification. Skull: Normal. Negative for fracture or focal lesion. Sinuses/Orbits: Paranasal sinuses and mastoid air cells are clear. Orbits are clear. Other: None. CT CERVICAL SPINE FINDINGS Alignment: Normal alignment Skull base and vertebrae: Normal craniocervical junction. Anterior cervical fusion from C5-C7. No acute loss vertebral  body height and disc height. No subluxation. Soft tissues and spinal canal: No epidural paraspinal hematoma. Disc levels:  Unremarkable Upper chest: Clear Other: None IMPRESSION: 1. No intracranial trauma. 2. No acute cervical spine findings. 3. Anterior cervical fusion without complication. Launch template CT CAP with Electronically Signed   By: Suzy Bouchard M.D.   On: 07/14/2016 20:03   Ct Cervical Spine Wo Contrast  Result Date: 07/14/2016 CLINICAL DATA:  Fall.  Fall from chair.  Neck pain. EXAM: CT  HEAD WITHOUT CONTRAST CT CERVICAL SPINE WITHOUT CONTRAST TECHNIQUE: Multidetector CT imaging of the head and cervical spine was performed following the standard protocol without intravenous contrast. Multiplanar CT image reconstructions of the cervical spine were also generated. COMPARISON:  Neck CT 09/01/2015 FINDINGS: CT HEAD FINDINGS Brain: No intracranial hemorrhage. No parenchymal contusion. No midline shift or mass effect. Basilar cisterns are patent. No skull base fracture. No fluid in the paranasal sinuses or mastoid air cells. Orbits are normal. Vascular: No hyperdense vessel or unexpected calcification. Skull: Normal. Negative for fracture or focal lesion. Sinuses/Orbits: Paranasal sinuses and mastoid air cells are clear. Orbits are clear. Other: None. CT CERVICAL SPINE FINDINGS Alignment: Normal alignment Skull base and vertebrae: Normal craniocervical junction. Anterior cervical fusion from C5-C7. No acute loss vertebral body height and disc height. No subluxation. Soft tissues and spinal canal: No epidural paraspinal hematoma. Disc levels:  Unremarkable Upper chest: Clear Other: None IMPRESSION: 1. No intracranial trauma. 2. No acute cervical spine findings. 3. Anterior cervical fusion without complication. Launch template CT CAP with Electronically Signed   By: Suzy Bouchard M.D.   On: 07/14/2016 20:03   Dg Knee Complete 4 Views Right  Result Date: 07/14/2016 CLINICAL DATA:  Right knee pain  after fall from chair EXAM: RIGHT KNEE - COMPLETE 4+ VIEW COMPARISON:  MRI of the knee from 05/23/2016 FINDINGS: Small suprapatellar joint effusion. Minor degenerative joint space narrowing of the femorotibial compartment with chondrocalcinosis of hyaline cartilage. Joint space narrowing and spurring also noted of the patellofemoral compartment. No acute displaced fracture. No intra-articular loose bodies. 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 No acute fracture noted. Electronically Signed   By: Ashley Royalty M.D.   On: 07/14/2016 19:50   Dg Hip Unilat W Or Wo Pelvis 2-3 Views Right  Result Date: 07/14/2016 CLINICAL DATA:  Right hip pain after fall from chair EXAM: DG HIP (WITH OR WITHOUT PELVIS) 2-3V RIGHT COMPARISON:  05/31/2016 FINDINGS: There is no evidence of hip fracture or dislocation. L3-4 bilateral pedicle screw fixation with interbody block noted. Lower lumbar facet arthropathy L4-5 bilaterally. The SI joints and pubic symphysis are maintained. No pelvic fracture. Hip joints are intact with slight joint space narrowing bilaterally. IMPRESSION: No acute radiographic abnormality of the bony pelvis and right hip. L3-4 pedicle screw fixation with interbody block. Electronically Signed   By: Ashley Royalty M.D.   On: 07/14/2016 19:45    ____________________________________________   PROCEDURES  Procedure(s) performed: None  Procedures  Critical Care performed: No ____________________________________________   INITIAL IMPRESSION / ASSESSMENT AND PLAN / ED COURSE  Pertinent labs & imaging results that were available during my care of the patient were reviewed by me and considered in my medical decision making (see chart for details).  66 y.o. female who slipped out of bed landing on her right side and over the last several hours has developed progressively worsening pain with some stiffness. On my examination, the patient will need  imaging of her head and neck, right shoulder, right hip, right knee, and right ankle although she has no evidence of swelling or bruising in any of these locations. In addition, she has not tried any pain medications so I will give her Percocet at the 7.5 mg dose given that she is on OxyContin daily. Plan reevaluation for final disposition.  ----------------------------------------- 8:06 PM on 07/14/2016 -----------------------------------------  The patient's plain x-rays do not show any acute fractures. I reexamined the patient's foot at the area that  is questionable on the right, and she does not have tenderness to palpation over the area. I'm awaiting the results of the CT head and neck and if they're reassuring, I will plan to discharge the patient home with expectant management and symptomatic treatment. Return precautions as well as follow-up instructions were discussed. ____________________________________________  FINAL CLINICAL IMPRESSION(S) / ED DIAGNOSES  Final diagnoses:  Fall, initial encounter  Neck pain on right side  Acute pain of right shoulder  Right hip pain  Acute pain of right knee  Acute right ankle pain         NEW MEDICATIONS STARTED DURING THIS VISIT:  New Prescriptions   No medications on file      [name], MD 07/14/16 2006

## 2016-07-14 NOTE — Discharge Instructions (Signed)
Please continue your daily OxyContin you take for chronic pain, and take Toradol as needed for breakthrough pain. You may also apply ice or heat to your painful joints for 10 minutes every 2 hours to decrease pain and swelling. Please take all precautions to prevent falls.  Return to the emergency department if you develop severe pain, numbness tingling or weakness, or any other symptoms concerning to you.

## 2016-07-14 NOTE — Telephone Encounter (Signed)
Per patient, Dr Roland Rack is going to do surgery, I have called his office and left your cell number for him to call you back. He is in surgery today.

## 2016-07-14 NOTE — ED Notes (Signed)
Patient transported from CT to room.

## 2016-07-14 NOTE — Telephone Encounter (Signed)
  Please have surgeon call me.    M

## 2016-07-14 NOTE — ED Notes (Signed)
Patient transported to X-ray 

## 2016-07-14 NOTE — ED Triage Notes (Signed)
Pt from the [facility] by EMS. Pt had a tip and fall from a chair around 1500 today. Pt did not want to be evaluated at that time. Pt stating that her neck began to hurt more so she decided to come in. Pt c/o right shoulder, neck, and knee pain. Pt stating that the "stiffness" has increased. Pt stating that she was supposed to have a surgery on her right knee, "but my platelets are to low." Pt denying hitting her head. Pt's BP is on the lower end, but pt stating that she believes that this is normal for her and that sometimes she need O2 too.

## 2016-07-14 NOTE — Telephone Encounter (Signed)
Asking if her platelet count is good enough to have surgery. Please advise

## 2016-07-18 ENCOUNTER — Telehealth: Payer: Self-pay | Admitting: *Deleted

## 2016-07-18 NOTE — Telephone Encounter (Signed)
Called patient to advise her that Dr. Mike Gip had spoken with the surgeon and the surgeon would order all labs needed for preop. Patient encouraged to call surgeon's office for appt for labs, voiced understanding.

## 2016-07-19 ENCOUNTER — Encounter: Payer: Self-pay | Admitting: Emergency Medicine

## 2016-07-19 ENCOUNTER — Ambulatory Visit (INDEPENDENT_AMBULATORY_CARE_PROVIDER_SITE_OTHER): Payer: Medicare Other | Admitting: Vascular Surgery

## 2016-07-19 ENCOUNTER — Inpatient Hospital Stay
Admission: EM | Admit: 2016-07-19 | Discharge: 2016-07-21 | DRG: 637 | Disposition: A | Discharge status: Home Health | Payer: Medicare Other | Attending: Internal Medicine | Admitting: Internal Medicine

## 2016-07-19 ENCOUNTER — Emergency Department: Payer: Medicare Other

## 2016-07-19 ENCOUNTER — Encounter (INDEPENDENT_AMBULATORY_CARE_PROVIDER_SITE_OTHER): Payer: Medicare Other

## 2016-07-19 ENCOUNTER — Telehealth: Payer: Self-pay | Admitting: *Deleted

## 2016-07-19 DIAGNOSIS — J45909 Unspecified asthma, uncomplicated: Secondary | ICD-10-CM | POA: Diagnosis present

## 2016-07-19 DIAGNOSIS — G2581 Restless legs syndrome: Secondary | ICD-10-CM | POA: Diagnosis present

## 2016-07-19 DIAGNOSIS — Z79891 Long term (current) use of opiate analgesic: Secondary | ICD-10-CM

## 2016-07-19 DIAGNOSIS — Z6841 Body Mass Index (BMI) 40.0 and over, adult: Secondary | ICD-10-CM | POA: Diagnosis not present

## 2016-07-19 DIAGNOSIS — K7460 Unspecified cirrhosis of liver: Secondary | ICD-10-CM | POA: Diagnosis present

## 2016-07-19 DIAGNOSIS — Z7951 Long term (current) use of inhaled steroids: Secondary | ICD-10-CM

## 2016-07-19 DIAGNOSIS — Z833 Family history of diabetes mellitus: Secondary | ICD-10-CM | POA: Diagnosis not present

## 2016-07-19 DIAGNOSIS — Z66 Do not resuscitate: Secondary | ICD-10-CM | POA: Diagnosis present

## 2016-07-19 DIAGNOSIS — G4700 Insomnia, unspecified: Secondary | ICD-10-CM | POA: Diagnosis present

## 2016-07-19 DIAGNOSIS — Z8673 Personal history of transient ischemic attack (TIA), and cerebral infarction without residual deficits: Secondary | ICD-10-CM | POA: Diagnosis not present

## 2016-07-19 DIAGNOSIS — R600 Localized edema: Secondary | ICD-10-CM | POA: Diagnosis not present

## 2016-07-19 DIAGNOSIS — T68XXXA Hypothermia, initial encounter: Secondary | ICD-10-CM | POA: Diagnosis not present

## 2016-07-19 DIAGNOSIS — G9340 Encephalopathy, unspecified: Secondary | ICD-10-CM | POA: Diagnosis present

## 2016-07-19 DIAGNOSIS — R680 Hypothermia, not associated with low environmental temperature: Secondary | ICD-10-CM | POA: Diagnosis present

## 2016-07-19 DIAGNOSIS — E162 Hypoglycemia, unspecified: Secondary | ICD-10-CM | POA: Diagnosis not present

## 2016-07-19 DIAGNOSIS — D509 Iron deficiency anemia, unspecified: Secondary | ICD-10-CM | POA: Diagnosis not present

## 2016-07-19 DIAGNOSIS — I10 Essential (primary) hypertension: Secondary | ICD-10-CM | POA: Diagnosis not present

## 2016-07-19 DIAGNOSIS — G9341 Metabolic encephalopathy: Secondary | ICD-10-CM | POA: Diagnosis present

## 2016-07-19 DIAGNOSIS — F329 Major depressive disorder, single episode, unspecified: Secondary | ICD-10-CM | POA: Diagnosis present

## 2016-07-19 DIAGNOSIS — E11649 Type 2 diabetes mellitus with hypoglycemia without coma: Principal | ICD-10-CM | POA: Diagnosis present

## 2016-07-19 DIAGNOSIS — Z981 Arthrodesis status: Secondary | ICD-10-CM | POA: Diagnosis not present

## 2016-07-19 DIAGNOSIS — D696 Thrombocytopenia, unspecified: Secondary | ICD-10-CM | POA: Diagnosis present

## 2016-07-19 DIAGNOSIS — E785 Hyperlipidemia, unspecified: Secondary | ICD-10-CM | POA: Diagnosis present

## 2016-07-19 DIAGNOSIS — I509 Heart failure, unspecified: Secondary | ICD-10-CM | POA: Diagnosis not present

## 2016-07-19 DIAGNOSIS — Z881 Allergy status to other antibiotic agents status: Secondary | ICD-10-CM

## 2016-07-19 DIAGNOSIS — Z87891 Personal history of nicotine dependence: Secondary | ICD-10-CM

## 2016-07-19 DIAGNOSIS — R410 Disorientation, unspecified: Secondary | ICD-10-CM | POA: Diagnosis present

## 2016-07-19 DIAGNOSIS — R54 Age-related physical debility: Secondary | ICD-10-CM | POA: Diagnosis not present

## 2016-07-19 DIAGNOSIS — E119 Type 2 diabetes mellitus without complications: Secondary | ICD-10-CM | POA: Diagnosis not present

## 2016-07-19 DIAGNOSIS — Z794 Long term (current) use of insulin: Secondary | ICD-10-CM | POA: Diagnosis not present

## 2016-07-19 DIAGNOSIS — G4733 Obstructive sleep apnea (adult) (pediatric): Secondary | ICD-10-CM | POA: Diagnosis present

## 2016-07-19 DIAGNOSIS — F411 Generalized anxiety disorder: Secondary | ICD-10-CM | POA: Diagnosis present

## 2016-07-19 DIAGNOSIS — K219 Gastro-esophageal reflux disease without esophagitis: Secondary | ICD-10-CM | POA: Diagnosis present

## 2016-07-19 DIAGNOSIS — J449 Chronic obstructive pulmonary disease, unspecified: Secondary | ICD-10-CM | POA: Diagnosis not present

## 2016-07-19 LAB — COMPREHENSIVE METABOLIC PANEL
ALBUMIN: 3.4 g/dL — AB (ref 3.5–5.0)
ALK PHOS: 82 U/L (ref 38–126)
ALT: 19 U/L (ref 14–54)
ANION GAP: 6 (ref 5–15)
AST: 34 U/L (ref 15–41)
BUN: 17 mg/dL (ref 6–20)
CO2: 31 mmol/L (ref 22–32)
Calcium: 9 mg/dL (ref 8.9–10.3)
Chloride: 101 mmol/L (ref 101–111)
Creatinine, Ser: 1.05 mg/dL — ABNORMAL HIGH (ref 0.44–1.00)
GFR calc Af Amer: >60 mL/min (ref >60)
GFR, EST NON AFRICAN AMERICAN: 55 mL/min — AB (ref >60)
GLUCOSE: 193 mg/dL — AB (ref 65–99)
Potassium: 3.5 mmol/L (ref 3.5–5.1)
Sodium: 138 mmol/L (ref 135–145)
Total Bilirubin: 0.7 mg/dL (ref 0.3–1.2)
Total Protein: 6.7 g/dL (ref 6.5–8.1)

## 2016-07-19 LAB — CBC WITH DIFFERENTIAL/PLATELET
BASOS PCT: 1 %
Basophils Absolute: 0 10*3/uL (ref 0–0.1)
EOS ABS: 0 10*3/uL (ref 0–0.7)
Eosinophils Relative: 0 %
HCT: 35.2 % (ref 35.0–47.0)
HEMOGLOBIN: 11.7 g/dL — AB (ref 12.0–16.0)
Lymphocytes Relative: 8 %
Lymphs Abs: 0.4 10*3/uL — ABNORMAL LOW (ref 1.0–3.6)
MCH: 31 pg (ref 26.0–34.0)
MCHC: 33.4 g/dL (ref 32.0–36.0)
MCV: 92.7 fL (ref 80.0–100.0)
MONO ABS: 0.5 10*3/uL (ref 0.2–0.9)
MONOS PCT: 9 %
NEUTROS PCT: 82 %
Neutro Abs: 4.7 10*3/uL (ref 1.4–6.5)
Platelets: 81 10*3/uL — ABNORMAL LOW (ref 150–440)
RBC: 3.79 MIL/uL — ABNORMAL LOW (ref 3.80–5.20)
RDW: 16.2 % — AB (ref 11.5–14.5)
WBC: 5.6 10*3/uL (ref 3.6–11.0)

## 2016-07-19 LAB — URINALYSIS, COMPLETE (UACMP) WITH MICROSCOPIC
BACTERIA UA: NONE SEEN
BILIRUBIN URINE: NEGATIVE
GLUCOSE, UA: NEGATIVE mg/dL
HGB URINE DIPSTICK: NEGATIVE
Ketones, ur: NEGATIVE mg/dL
LEUKOCYTES UA: NEGATIVE
NITRITE: NEGATIVE
PH: 7 (ref 5.0–8.0)
Protein, ur: NEGATIVE mg/dL
RBC / HPF: NONE SEEN RBC/hpf (ref 0–5)
SPECIFIC GRAVITY, URINE: 1.011 (ref 1.005–1.030)

## 2016-07-19 LAB — GLUCOSE, CAPILLARY
GLUCOSE-CAPILLARY: 156 mg/dL — AB (ref 65–99)
GLUCOSE-CAPILLARY: 88 mg/dL (ref 65–99)
GLUCOSE-CAPILLARY: 145 mg/dL — AB (ref 65–99)
GLUCOSE-CAPILLARY: 199 mg/dL — AB (ref 65–99)
GLUCOSE-CAPILLARY: 149 mg/dL — AB (ref 65–99)
Glucose-Capillary: 101 mg/dL — ABNORMAL HIGH (ref 65–99)

## 2016-07-19 LAB — LACTIC ACID, PLASMA: LACTIC ACID, VENOUS: 1.4 mmol/L (ref 0.5–1.9)

## 2016-07-19 LAB — MRSA PCR SCREENING: MRSA by PCR: NEGATIVE

## 2016-07-19 MED ORDER — ENOXAPARIN SODIUM 40 MG/0.4ML ~~LOC~~ SOLN
40.0000 mg | Freq: Two times a day (BID) | SUBCUTANEOUS | Status: DC
  Administered 2016-07-19 – 2016-07-21 (×4): 40 mg via SUBCUTANEOUS
  Filled 2016-07-19 (×4): qty 0.4

## 2016-07-19 MED ORDER — SODIUM CHLORIDE 0.9% FLUSH
3.0000 mL | Freq: Two times a day (BID) | INTRAVENOUS | Status: DC
  Administered 2016-07-19 – 2016-07-21 (×5): 3 mL via INTRAVENOUS

## 2016-07-19 MED ORDER — INSULIN ASPART 100 UNIT/ML ~~LOC~~ SOLN: 0.0000 [IU] | Freq: Every day | SUBCUTANEOUS | Status: DC

## 2016-07-19 MED ORDER — SODIUM CHLORIDE 0.9 % IV BOLUS (SEPSIS)
500.0000 mL | Freq: Once | INTRAVENOUS | Status: AC
  Administered 2016-07-19: 500 mL via INTRAVENOUS

## 2016-07-19 MED ORDER — SODIUM CHLORIDE 0.9 % IV SOLN
INTRAVENOUS | Status: AC
  Administered 2016-07-19: 16:00:00 via INTRAVENOUS

## 2016-07-19 MED ORDER — TRAMADOL HCL 50 MG PO TABS
50.0000 mg | ORAL_TABLET | Freq: Two times a day (BID) | ORAL | Status: DC | PRN
  Administered 2016-07-20 – 2016-07-21 (×2): 50 mg via ORAL
  Filled 2016-07-19 (×2): qty 1

## 2016-07-19 MED ORDER — ACETAMINOPHEN 325 MG PO TABS
650.0000 mg | ORAL_TABLET | Freq: Four times a day (QID) | ORAL | Status: DC | PRN
  Administered 2016-07-19 – 2016-07-20 (×2): 650 mg via ORAL
  Filled 2016-07-19 (×2): qty 2

## 2016-07-19 MED ORDER — ONDANSETRON HCL 4 MG PO TABS: 4.0000 mg | ORAL_TABLET | Freq: Four times a day (QID) | ORAL | Status: DC | PRN

## 2016-07-19 MED ORDER — INSULIN ASPART 100 UNIT/ML ~~LOC~~ SOLN
0.0000 [IU] | Freq: Three times a day (TID) | SUBCUTANEOUS | Status: DC
  Administered 2016-07-21: 1 [IU] via SUBCUTANEOUS
  Filled 2016-07-19: qty 1

## 2016-07-19 MED ORDER — OXYCODONE HCL 5 MG PO TABS
5.0000 mg | ORAL_TABLET | Freq: Two times a day (BID) | ORAL | Status: DC | PRN
  Administered 2016-07-19 – 2016-07-21 (×3): 5 mg via ORAL
  Filled 2016-07-19 (×3): qty 1

## 2016-07-19 MED ORDER — ONDANSETRON HCL 4 MG/2ML IJ SOLN: 4.0000 mg | Freq: Four times a day (QID) | INTRAMUSCULAR | Status: DC | PRN

## 2016-07-19 MED ORDER — ACETAMINOPHEN 650 MG RE SUPP: 650.0000 mg | Freq: Four times a day (QID) | RECTAL | Status: DC | PRN

## 2016-07-19 NOTE — H&P (Addendum)
[facility] NAME: Stephanie [name]    MR#:  [id number]  DATE OF BIRTH:  [birth year]  DATE OF ADMISSION:  07/19/2016  PRIMARY CARE PHYSICIAN: Sarajane Jews, MD   REQUESTING/REFERRING PHYSICIAN: Schuyler Amor, MD  CHIEF COMPLAINT:  Low blood sugars  HISTORY OF PRESENT ILLNESS:  Stephanie [name]  is a 66 y.o. female with a known history of Diabetes mellitus, hyperlipidemia, obstructive sleep apnea, essential hypertension, liver cirrhosis and multiple other medical problems sent over to the emergency department from the nursing home for low blood sugar. According to the EMS and patient was altered and blood sugar was found to be at 50. Patient was given 1 amp of D50 and an sugar came up to normal range at that point and she is brought into the emergency department. In the ED blood sugar was at 88 Patient was found to be hypothermic, covered with Bair hugger and patient was arousable during my examination but unable to provide good history very lethargic. CT head is negative. No family members at bedside  PAST MEDICAL HISTORY:   Past Medical History:  Diagnosis Date  . Adenomatous colon polyp 08/27/2014   Polyps x 3  . Anemia   . Anxiety   . 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 Arthritis   . Asthma   . Bipolar affective disorder ([place])    h/o  . Blood transfusion 2000  . Blood transfusion without reported diagnosis   . Chronic abdominal pain    abdominal wall pain  . 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 Colon polyp   . Depression   . Diabetes mellitus without complication ([place])   . Fatty liver 04/09/08   found in abd CT  . GERD (gastroesophageal reflux disease)   . H/O: CVA (cardiovascular accident)    TIA  . H/O: rheumatic fever   . Hyperlipidemia   . Incontinence   . Insomnia   . Obstructive sleep apnea    not using CPAP, last sleep study 2.5 years ago, per pt doctor is aware  . Platelets decreased ([place])   . RLS  (restless legs syndrome)   . Shortness of breath   . Sleep apnea   . Stroke [facility])    TIA- 2002  . Ulcer ([place])   . Unspecified disorders of nervous system     PAST SURGICAL HISTOIRY:   Past Surgical History:  Procedure Laterality Date  . ABDOMINAL EXPLORATION SURGERY    . ABDOMINAL HYSTERECTOMY     total  . Axillary artery cannulation via 8-mm Hemashield graft, median sternotomy, extracorporeal circulation with deep hypothermic circulatory arrest, repair of  aortic dessection  01/23/2009   Dr Arlyce Dice  . BACK SURGERY     x 45  . cataract     both eyes  . CHOLECYSTECTOMY    . COLONOSCOPY    . EYE SURGERY     bilat  . HEMORROIDECTOMY     and colon polyp removed  . LIVER BIOPSY  04/10/2012   Procedure: LIVER BIOPSY;  Surgeon: Inda Castle, MD;  Location: [facility];  Service: Endoscopy;  Laterality: N/A;  ultrasound to mark order for abd limited/liver to be marked to be sent by [email or web address]   . LUMBAR FUSION  10/09  . LUMBAR WOUND DEBRIDEMENT  05/09/2011   Procedure: LUMBAR WOUND DEBRIDEMENT;  Surgeon: Dahlia Bailiff;  Location: [facility];  Service: Orthopedics;  Laterality: N/A;  IRRIGATION AND DEBRIDEMENT SPINAL WOUND  . POSTERIOR CERVICAL FUSION/FORAMINOTOMY    . ROTATOR CUFF REPAIR     left    SOCIAL HISTORY:   Social History  Substance Use Topics  . Smoking status: Former Smoker    Packs/day: 1.50    Years: 50.00    Types: Cigarettes    Quit date: 04/06/2013  . Smokeless tobacco: Never Used  . Alcohol use No    FAMILY HISTORY:   Family History  Problem Relation Age of Onset  . Breast cancer Mother   . Lung cancer Mother   . Stomach cancer Father   . Diabetes      4 aunts, and 1 uncle  . Colon cancer Neg Hx   . Esophageal cancer Neg Hx   . Rectal cancer Neg Hx     DRUG ALLERGIES:   Allergies  Allergen Reactions  . Doxycycline Hives, Swelling and Other (See Comments)    Reaction:  Facial swelling     REVIEW OF SYSTEMS:  Review of systems  unobtainable as patient is encephalopathic   MEDICATIONS AT HOME:   Prior to Admission medications   Medication Sig Start Date End Date Taking? Authorizing Provider  ARIPiprazole (ABILIFY) 30 MG tablet Take 30 mg by mouth daily.   Yes Historical Provider, MD  budesonide-formoterol (SYMBICORT) 160-4.5 MCG/ACT inhaler Inhale 2 puffs into the lungs 2 (two) times daily.   Yes Historical Provider, MD  buPROPion (WELLBUTRIN SR) 150 MG 12 hr tablet Take 150 mg by mouth daily.   Yes Historical Provider, MD  clonazePAM (KLONOPIN) 0.5 MG tablet Take 0.25 mg by mouth 3 (three) times daily.    Yes Historical Provider, MD  dicyclomine (BENTYL) 10 MG capsule Take 10 mg by mouth 3 (three) times daily.    Yes Historical Provider, MD  ferrous sulfate 325 (65 FE) MG tablet Take 325 mg by mouth 2 (two) times daily.    Yes Historical Provider, MD  furosemide (LASIX) 80 MG tablet Take 80 mg by mouth daily.   Yes Historical Provider, MD  gabapentin (NEURONTIN) 600 MG tablet Take 600 mg by mouth 2 (two) times daily.    Yes Historical Provider, MD  insulin glargine (LANTUS) 100 UNIT/ML injection Inject 20 Units into the skin at bedtime.   Yes Historical Provider, MD  lactulose (CHRONULAC) 10 GM/15ML solution Take 10 g by mouth 3 (three) times a week.    Yes Historical Provider, MD  lamoTRIgine (LAMICTAL) 100 MG tablet Take 100 mg by mouth 2 (two) times daily.   Yes Historical Provider, MD  lidocaine (LIDODERM) 5 % Place 1 patch onto the skin daily. 05/31/16  Yes Daymon Larsen, MD  metFORMIN (GLUCOPHAGE) 500 MG tablet Take 500 mg by mouth daily. Take 2 tablets (1000 mg) by mouth every morning with meal, and take 1 tablet by mouth every evening at 5 pm with meal.   Yes Historical Provider, MD  metoCLOPramide (REGLAN) 5 MG tablet Take 5 mg by mouth 4 (four) times daily. Before meals and at bedtime   Yes Historical Provider, MD  mirtazapine (REMERON) 15 MG tablet Take 15 mg by mouth at bedtime.   Yes Historical Provider,  MD  Multiple Vitamin (MULTIVITAMIN WITH MINERALS) TABS tablet Take 1 tablet by mouth daily with breakfast.    Yes Historical Provider, MD  omeprazole (PRILOSEC) 20 MG capsule Take 20 mg by mouth daily before breakfast.    Yes Historical Provider, MD  oxybutynin (DITROPAN-XL) 5 MG 24 hr tablet Take  5 mg by mouth daily.   Yes Historical Provider, MD  oxyCODONE (OXY IR/ROXICODONE) 5 MG immediate release tablet Take 5 mg by mouth every 12 (twelve) hours.    Yes Historical Provider, MD  pioglitazone (ACTOS) 30 MG tablet Take 30 mg by mouth daily. At 1 PM   Yes Historical Provider, MD  potassium chloride SA (K-DUR,KLOR-CON) 20 MEQ tablet Take 20 mEq by mouth daily. *Note dose*   Yes Historical Provider, MD  pregabalin (LYRICA) 150 MG capsule Take 150 mg by mouth 3 (three) times daily.    Yes Historical Provider, MD  rOPINIRole (REQUIP) 3 MG tablet Take 3 mg by mouth at bedtime.   Yes Historical Provider, MD  simvastatin (ZOCOR) 10 MG tablet Take 10 mg by mouth at bedtime.    Yes Historical Provider, MD  spironolactone (ALDACTONE) 50 MG tablet Take 25 mg by mouth.    Yes Historical Provider, MD  vitamin C (ASCORBIC ACID) 500 MG tablet Take 500 mg by mouth 2 (two) times daily.   Yes Historical Provider, MD  zolpidem (AMBIEN) 5 MG tablet Take 5 mg by mouth at bedtime.    Yes Historical Provider, MD  acetaminophen (TYLENOL) 325 MG tablet Take 650 mg by mouth every 6 (six) hours as needed.    Historical Provider, MD  albuterol (VENTOLIN HFA) 108 (90 Base) MCG/ACT inhaler Inhale 2 puffs into the lungs every 6 (six) hours as needed for wheezing or shortness of breath. 12/24/15   Chesley Mires, MD  insulin lispro (HUMALOG) 100 UNIT/ML injection Inject 0-9 Units into the skin 3 (three) times daily before meals. Pt uses as needed per sliding scale:    150-200:  0 units  201-250:  5 units  251-300:  8 units 301-350:  9 units  Greater than 350:  Call MD    Historical Provider, MD  loperamide (IMODIUM) 2 MG capsule  Take 4 mg by mouth as needed for diarrhea or loose stools. *Do not exceed 8 doses in 24 hours*    Historical Provider, MD  LORazepam (ATIVAN) 0.5 MG tablet Take 0.5 mg by mouth daily as needed for anxiety.     Historical Provider, MD  senna (SENOKOT) 8.6 MG tablet Take 1 tablet by mouth 3 (three) times a week. Take on Monday, Wednesday, and Friday. *Hold for loose stools*    Historical Provider, MD  traMADol (ULTRAM) 50 MG tablet Take 50 mg by mouth every 12 (twelve) hours as needed for moderate pain.     Historical Provider, MD      VITAL SIGNS:  Blood pressure 116/60, pulse 78, temperature (!) 96.7 F (35.9 C), temperature source Rectal, resp. rate 14, height 5' 2"  (1.575 m), weight 102.1 kg (225 lb), SpO2 97 %.  PHYSICAL EXAMINATION:  GENERAL:  66 y.o.-year-old patient lying in the bed with no acute distress. Obese lady EYES: Pupils equal, round, reactive to light and accommodation. No scleral icterus. Extraocular muscles intact.  HEENT: Head atraumatic, normocephalic. Oropharynx and nasopharynx clear.  NECK:  Supple, no jugular venous distention. No thyroid enlargement, no tenderness.  LUNGS: Normal breath sounds bilaterally, no wheezing, rales,rhonchi or crepitation. No use of accessory muscles of respiration.  CARDIOVASCULAR: S1, S2 normal. No murmurs, rubs, or gallops.  ABDOMEN: Soft, nontender, nondistended. Bowel sounds present. No organomegaly or mass.  EXTREMITIES: No pedal edema, cyanosis, or clubbing.  NEUROLOGIC: Encephalopathic and is  arousable but lethargicPSYCHIATRIC: The patient is  arousable but lethargic   SKIN: No obvious rash, lesion, or ulcer.  LABORATORY PANEL:   CBC  Recent Labs Lab 07/19/16 1111  WBC 5.6  HGB 11.7*  HCT 35.2  PLT 81*   ------------------------------------------------------------------------------------------------------------------  Chemistries   Recent Labs Lab 07/19/16 0913  NA 138  K 3.5  CL 101  CO2 31  GLUCOSE 193*  BUN  17  CREATININE 1.05*  CALCIUM 9.0  AST 34  ALT 19  ALKPHOS 82  BILITOT 0.7   ------------------------------------------------------------------------------------------------------------------  Cardiac Enzymes No results for input(s): TROPONINI in the last 168 hours. ------------------------------------------------------------------------------------------------------------------  RADIOLOGY:  Dg Chest 2 View  Result Date: 07/19/2016 CLINICAL DATA:  L status change, un clear Seth with speech, weakness, hypoglycemia. History of previous CVA, diabetes, asthma, cirrhosis, discontinued smoking four years ago after many years. EXAM: CHEST  2 VIEW COMPARISON:  Chest x-ray of April 18, 2016 FINDINGS: The lungs are reasonably well inflated. The interstitial markings are coarse. There is no alveolar infiltrate or pleural effusion. The cardiac silhouette is enlarged. The pulmonary vascularity is mildly engorged. The bony thorax exhibits no acute abnormality. A nerve stimulator electrode projects over the lower thoracic spinal canal. IMPRESSION: CHF superimposed upon COPD and mild chronic interstitial prominence. No definite acute pneumonia. Electronically Signed   By: David  Martinique M.D.   On: 07/19/2016 10:28   Ct Head Wo Contrast  Result Date: 07/19/2016 CLINICAL DATA:  Altered mental status beginning this morning. Altered speech. EXAM: CT HEAD WITHOUT CONTRAST TECHNIQUE: Contiguous axial images were obtained from the base of the skull through the vertex without intravenous contrast. COMPARISON:  Head CT scan 07/14/2016 and 03/21/2015. FINDINGS: Brain: Appears normal without hemorrhage, infarct, mass lesion, mass effect, midline shift or abnormal extra-axial fluid collection. No hydrocephalus or pneumocephalus. Vascular: Atherosclerosis noted. Skull: Intact. Sinuses/Orbits: Status post bilateral lens extraction. Otherwise negative. Other: None. IMPRESSION: No acute abnormality. Atherosclerosis.  Electronically Signed   By: Inge Rise M.D.   On: 07/19/2016 10:18    EKG:   Orders placed or performed during the hospital encounter of 07/19/16  . ED EKG  . ED EKG  . EKG 12-Lead  . EKG 12-Lead   *Note: Due to a large number of results and/or encounters for the requested time period, some results have not been displayed. A complete set of results can be found in Results Review.    IMPRESSION AND PLAN:   Stephanie [name]  is a 66 y.o. female with a known history of Diabetes mellitus, hyperlipidemia, obstructive sleep apnea, essential hypertension, liver cirrhosis and multiple other medical problems sent over to the emergency department from the nursing home for low blood sugar. According to the EMS and patient was altered and blood sugar was found to be at 50. Patient was given 1 amp of D50 and an sugar came up to normal range at that point and she is brought into the emergency department. In the ED blood sugar was at 88 Patient was found to be hypothermic, covered with Bair hugger   #Acute metabolic encephalopathy secondary to hypoglycemia  patient does not meet septic criteria as her white count is normal, lactate is normal  Monitor  Accu-Cheks closely. Neuro checks   CT head is negative    # hypothermia secondary to hypoglycemia  Monitor Accu-Cheks   patient is persistently hypoglycemic we will start her on D5 normal saline Provide warming blanket to increase the Court temperature  #Diabetes mellitus with hypoglycemia-etiology unclear Currently holding all her home medications Start her on sliding scale insulin  #History of obstructive sleep apnea CPAP daily  at bedtime  Provide GI and DVT prophylaxis   All the records are reviewed and case discussed with ED provider. Management plans discussed with the patient, family and they are in agreement.  CODE STATUS: DNR acc to old records TOTAL TIME TAKING CARE OF THIS PATIENT: 43  minutes.   Note: This dictation was  prepared with Dragon dictation along with smaller phrase technology. Any transcriptional errors that result from this process are unintentional.  Nicholes Mango M.D on 07/19/2016 at 12:55 PM  Between 7am to 6pm - Pager - [phone]  After 6pm go to [email or web address] - password [email or web address] [facility]  [facility]  Office  [phone]  CC: Primary care physician; Sarajane Jews, MD

## 2016-07-19 NOTE — Progress Notes (Signed)
Patient admitted today from the [facility]. Alert and oriented. Temperature is now stable and CBG's are WDL. Complaining of generalized aching, states it's from lying in the ED bed. Given tylenol with some improvement. BP is soft, but has improved some with continuous normal saline. Placed on contact precautions due to history of ESBL and MRSA swab sent. NSR on tele. Will continue to monitor.

## 2016-07-19 NOTE — ED Notes (Signed)
Pt placed on bair hugger.  MD aware.

## 2016-07-19 NOTE — ED Notes (Signed)
Pt provided 231m apple juice.

## 2016-07-19 NOTE — Progress Notes (Signed)
Orders for lovenox changed to 17m BID due to crcl >343mmin and BMI >40. Continue to monitor CBC as pt has hx of thrombocytopenia  Chadley Dziedzic D Sian Rockers, Pharm.D, BCPS Clinical Pharmacist

## 2016-07-19 NOTE — ED Notes (Signed)
Patient transported to CT 

## 2016-07-19 NOTE — Progress Notes (Signed)
Sent to text page for MD to order PT/OT due to pager system being down. No orders received, will pass on for next day shift to get ordered.

## 2016-07-19 NOTE — ED Triage Notes (Signed)
Pt in via EMS from the [facility]; per EMS, pt reported to be "babbling" this morning upon waking, words unclear and not making any sense.  EMS reports CBG 50, amp of D50 given, CBG 267 post D50.  Pt A/Ox4 upon arrival, NAD noted at this time.

## 2016-07-19 NOTE — Clinical Social Work Note (Signed)
CSW received consult that patient is from [facility], [place].  CSW to meet with patient and complete assessment at a later time.  Jones Broom. Norval Morton, MSW, [place]  07/19/2016 5:23 PM

## 2016-07-19 NOTE — ED Provider Notes (Addendum)
[facility] Emergency Department Provider Note  ____________________________________________   I have reviewed the triage vital signs and the nursing notes.   HISTORY  Chief Complaint Hypoglycemia    HPI Stephanie [name] is a 66 y.o. female with multiple medical problems presents today with no complaints. According to EMS, she was somewhat confused when they got there they found her blood sugar to be 50, gave her an amp of D50 her sugar came up to normal range and at that point, she does not have any other symptoms and she felt back to baseline. She has no complaints.   Past Medical History:  Diagnosis Date  . Adenomatous colon polyp 08/27/2014   Polyps x 3  . Anemia   . Anxiety   . 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 Arthritis   . Asthma   . Bipolar affective disorder ([place])    h/o  . Blood transfusion 2000  . Blood transfusion without reported diagnosis   . Chronic abdominal pain    abdominal wall pain  . 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 Colon polyp   . Depression   . Diabetes mellitus without complication ([place])   . Fatty liver 04/09/08   found in abd CT  . GERD (gastroesophageal reflux disease)   . H/O: CVA (cardiovascular accident)    TIA  . H/O: rheumatic fever   . Hyperlipidemia   . Incontinence   . Insomnia   . Obstructive sleep apnea    not using CPAP, last sleep study 2.5 years ago, per pt doctor is aware  . Platelets decreased ([place])   . RLS (restless legs syndrome)   . Shortness of breath   . Sleep apnea   . Stroke [facility])    TIA- 2002  . Ulcer ([place])   . Unspecified disorders of nervous system     Patient Active Problem List   Diagnosis Date Noted  . Lymphedema 06/14/2016  . Pain in limb 05/06/2016  . Swelling of limb 05/06/2016  . Postphlebitic syndrome with inflammation 05/06/2016  . Pneumonia 04/18/2016  . Chest pain 04/04/2016  . Iron deficiency anemia 01/30/2016  . Hypoglycemia 09/09/2015  . Hyperglycemia  07/16/2015  . Neuropathy ([place]) 06/08/2015  . Thrombocytopenia ([place]) 08/19/2014  . Abdominal pain, chronic, epigastric 06/12/2014  . Liver cirrhosis secondary to NASH ([place]) 02/27/2014  . Dizziness and giddiness 10/30/2013  . DOE (dyspnea on exertion) 10/30/2013  . Poorly controlled type 2 diabetes mellitus with complication ([place]) 93/81/0175  . DNR (do not resuscitate) 07/29/2013  . Encounter for routine gynecological examination 07/29/2013  . Dyspnea 07/10/2013  . Lung nodule 07/10/2013  . Varicose veins of left lower extremity 10/09/2012  . GERD (gastroesophageal reflux disease) 10/09/2012  . Cirrhosis of liver ([place]) 02/02/2012  . Anxiety 07/11/2011  . Aortic dissection ([place])   . BACK PAIN, LUMBAR, CHRONIC 11/19/2009  . UNS ADVRS EFF OTH RX MEDICINAL&BIOLOGICAL SBSTNC 12/16/2008  . UNSPECIFIED VITAMIN D DEFICIENCY 09/26/2008  . TOBACCO ABUSE 06/19/2008  . RESTLESS LEGS SYNDROME 04/27/2007  . INSOMNIA 04/27/2007  . HLD (hyperlipidemia) 03/08/2007  . OSA (obstructive sleep apnea) 03/08/2007  . Essential hypertension 03/08/2007  . Asthma 03/08/2007  . BIPOLAR AFFECTIVE DISORDER, HX OF 03/08/2007    Past Surgical History:  Procedure Laterality Date  . ABDOMINAL EXPLORATION SURGERY    . ABDOMINAL HYSTERECTOMY     total  . Axillary artery cannulation via 8-mm Hemashield graft, median sternotomy, extracorporeal circulation with deep  hypothermic circulatory arrest, repair of  aortic dessection  01/23/2009   Dr Arlyce Dice  . BACK SURGERY     x 45  . cataract     both eyes  . CHOLECYSTECTOMY    . COLONOSCOPY    . EYE SURGERY     bilat  . HEMORROIDECTOMY     and colon polyp removed  . LIVER BIOPSY  04/10/2012   Procedure: LIVER BIOPSY;  Surgeon: Inda Castle, MD;  Location: [facility];  Service: Endoscopy;  Laterality: N/A;  ultrasound to mark order for abd limited/liver to be marked to be sent by [email or web address]   . LUMBAR FUSION  10/09  . LUMBAR WOUND DEBRIDEMENT  05/09/2011    Procedure: LUMBAR WOUND DEBRIDEMENT;  Surgeon: Dahlia Bailiff;  Location: [facility];  Service: Orthopedics;  Laterality: N/A;  IRRIGATION AND DEBRIDEMENT SPINAL WOUND  . POSTERIOR CERVICAL FUSION/FORAMINOTOMY    . ROTATOR CUFF REPAIR     left    Prior to Admission medications   Medication Sig Start Date End Date Taking? Authorizing Provider  acetaminophen (TYLENOL) 325 MG tablet Take 650 mg by mouth every 6 (six) hours as needed.    Historical Provider, MD  albuterol (VENTOLIN HFA) 108 (90 Base) MCG/ACT inhaler Inhale 2 puffs into the lungs every 6 (six) hours as needed for wheezing or shortness of breath. 12/24/15   Chesley Mires, MD  ARIPiprazole (ABILIFY) 30 MG tablet Take 30 mg by mouth daily.    Historical Provider, MD  budesonide-formoterol (SYMBICORT) 160-4.5 MCG/ACT inhaler Inhale 2 puffs into the lungs 2 (two) times daily.    Historical Provider, MD  buPROPion (WELLBUTRIN SR) 150 MG 12 hr tablet Take 150 mg by mouth daily.    Historical Provider, MD  clonazePAM (KLONOPIN) 0.5 MG tablet Take 0.5 mg by mouth 3 (three) times daily.     Historical Provider, MD  dicyclomine (BENTYL) 10 MG capsule Take 10 mg by mouth 3 (three) times daily.     Historical Provider, MD  ferrous sulfate 325 (65 FE) MG tablet Take 325 mg by mouth 2 (two) times daily.     Historical Provider, MD  furosemide (LASIX) 80 MG tablet Take 80 mg by mouth daily.    Historical Provider, MD  gabapentin (NEURONTIN) 600 MG tablet Take 600 mg by mouth 2 (two) times daily.     Historical Provider, MD  insulin glargine (LANTUS) 100 UNIT/ML injection Inject 20 Units into the skin at bedtime.    Historical Provider, MD  insulin lispro (HUMALOG) 100 UNIT/ML injection Inject 0-9 Units into the skin 3 (three) times daily before meals. Pt uses as needed per sliding scale:    150-200:  0 units  201-250:  5 units  251-300:  8 units 301-350:  9 units  Greater than 350:  Call MD    Historical Provider, MD  lactulose (CHRONULAC) 10 GM/15ML  solution Take 10 g by mouth 3 (three) times a week.     Historical Provider, MD  lamoTRIgine (LAMICTAL) 100 MG tablet Take 100 mg by mouth 2 (two) times daily.    Historical Provider, MD  lidocaine (LIDODERM) 5 % Place 1 patch onto the skin daily. 05/31/16   Daymon Larsen, MD  loperamide (IMODIUM) 2 MG capsule Take 4 mg by mouth as needed for diarrhea or loose stools. *Do not exceed 8 doses in 24 hours*    Historical Provider, MD  LORazepam (ATIVAN) 0.5 MG tablet Take 0.5 mg by mouth daily as needed for anxiety.  Historical Provider, MD  metFORMIN (GLUCOPHAGE) 500 MG tablet Take 500 mg by mouth daily. Take 2 tablets (1000 mg) by mouth every morning with meal, and take 1 tablet by mouth every evening at 5 pm with meal.    Historical Provider, MD  metoCLOPramide (REGLAN) 5 MG tablet Take 5 mg by mouth 4 (four) times daily. Before meals and at bedtime    Historical Provider, MD  mirtazapine (REMERON) 15 MG tablet Take 15 mg by mouth at bedtime.    Historical Provider, MD  Multiple Vitamin (MULTIVITAMIN WITH MINERALS) TABS tablet Take 1 tablet by mouth daily with breakfast.     Historical Provider, MD  omeprazole (PRILOSEC) 40 MG capsule Take 40 mg by mouth daily before breakfast.    Historical Provider, MD  oxybutynin (DITROPAN-XL) 5 MG 24 hr tablet Take 5 mg by mouth daily.    Historical Provider, MD  oxyCODONE (OXY IR/ROXICODONE) 5 MG immediate release tablet Take 5 mg by mouth every 12 (twelve) hours.     Historical Provider, MD  pioglitazone (ACTOS) 30 MG tablet Take 30 mg by mouth daily. At 1 PM    Historical Provider, MD  potassium chloride SA (K-DUR,KLOR-CON) 20 MEQ tablet Take 20 mEq by mouth daily. *Note dose*    Historical Provider, MD  pregabalin (LYRICA) 150 MG capsule Take 150 mg by mouth 3 (three) times daily.     Historical Provider, MD  rOPINIRole (REQUIP) 3 MG tablet Take 3 mg by mouth at bedtime.    Historical Provider, MD  senna (SENOKOT) 8.6 MG tablet Take 1 tablet by mouth 3  (three) times a week. Take on Monday, Wednesday, and Friday. *Hold for loose stools*    Historical Provider, MD  simvastatin (ZOCOR) 10 MG tablet Take 10 mg by mouth at bedtime.     Historical Provider, MD  spironolactone (ALDACTONE) 25 MG tablet Take 25 mg by mouth daily.    Historical Provider, MD  traMADol (ULTRAM) 50 MG tablet Take 50 mg by mouth every 12 (twelve) hours as needed for moderate pain.     Historical Provider, MD  vitamin C (ASCORBIC ACID) 500 MG tablet Take 500 mg by mouth 2 (two) times daily.    Historical Provider, MD  zolpidem (AMBIEN) 5 MG tablet Take 5 mg by mouth at bedtime.     Historical Provider, MD    Allergies Doxycycline  Family History  Problem Relation Age of Onset  . Breast cancer Mother   . Lung cancer Mother   . Stomach cancer Father   . Diabetes      4 aunts, and 1 uncle  . Colon cancer Neg Hx   . Esophageal cancer Neg Hx   . Rectal cancer Neg Hx     Social History Social History  Substance Use Topics  . Smoking status: Former Smoker    Packs/day: 1.50    Years: 50.00    Types: Cigarettes    Quit date: 04/06/2013  . Smokeless tobacco: Never Used  . Alcohol use No    Review of Systems Constitutional: No fever/chills Eyes: No visual changes. ENT: No sore throat. No stiff neck no neck pain Cardiovascular: Denies chest pain. Respiratory: Denies shortness of breath. Gastrointestinal:   no vomiting.  No diarrhea.  No constipation. Genitourinary: Negative for dysuria. Musculoskeletal: Negative lower extremity swelling Skin: Negative for rash. Neurological: Negative for severe headaches, focal weakness or numbness. 10-point ROS otherwise negative.  ____________________________________________   PHYSICAL EXAM:  VITAL SIGNS: ED Triage Vitals [07/19/16  0908]  Enc Vitals Group     BP      Pulse      Resp      Temp      Temp src      SpO2      Weight 225 lb (102.1 kg)     Height 5' 2"  (1.575 m)     Head Circumference      Peak  Flow      Pain Score 9     Pain Loc      Pain Edu?      Excl. in [place]?     Constitutional: Alert and oriented. Well appearing and in no acute distress. Eyes: Conjunctivae are normal. PERRL. EOMI. Head: Atraumatic. Nose: No congestion/rhinnorhea. Mouth/Throat: Mucous membranes are moist.  Oropharynx non-erythematous. Neck: No stridor.   Nontender with no meningismus Cardiovascular: Normal rate, regular rhythm. Grossly normal heart sounds.  Good peripheral circulation. Respiratory: Normal respiratory effort.  No retractions. Lungs CTAB. Abdominal: Soft and nontender. No distention. No guarding no rebound morbidly obese Back:  There is no focal tenderness or step off.  there is no midline tenderness there are no lesions noted. there is no CVA tenderness Musculoskeletal: No lower extremity tenderness, no upper extremity tenderness. No joint effusions, no DVT signs strong distal pulses no edema Neurologic:  Normal speech and language. No gross focal neurologic deficits are appreciated.  Skin:  Skin is warm, dry and intact. No rash noted. Psychiatric: Mood and affect are normal. Speech and behavior are normal.  ____________________________________________   LABS (all labs ordered are listed, but only abnormal results are displayed)  Labs Reviewed  URINALYSIS, COMPLETE (UACMP) WITH MICROSCOPIC  COMPREHENSIVE METABOLIC PANEL  CBC WITH DIFFERENTIAL/PLATELET   ____________________________________________  EKG  I personally interpreted any EKGs ordered by me or triage  ____________________________________________  RADIOLOGY  I reviewed any imaging ordered by me or triage that were performed during my shift and, if possible, patient and/or family made aware of any abnormal findings. ____________________________________________   PROCEDURES  Procedure(s) performed: None  Procedures  Critical Care performed: None  ____________________________________________   INITIAL  IMPRESSION / ASSESSMENT AND PLAN / ED COURSE  Pertinent labs & imaging results that were available during my care of the patient were reviewed by me and considered in my medical decision making (see chart for details).  Patient presents today after having had a low blood sugar. According to EMS after administration of sugar, her blood sugar is normal and her mental status quickly resolved. Patient is in no acute distress with no complaints at this time. She does appear to be mildly dehydrated. Given her multiple comorbidities we'll check basic blood work and give her some IV fluid and check a urinalysis and just assess her generally to make sure there is no other pathology resulting in this. Core temp is low, but this can also happen with hypoglycemia.  Giving ivf.    ----------------------------------------- 9:25 AM on 07/19/2016 -----------------------------------------  She remains somnolent here. We will obtain ct to r/o other causes    ____________________________________________   FINAL CLINICAL IMPRESSION(S) / ED DIAGNOSES  Final diagnoses:  None      This chart was dictated using voice recognition software.  Despite best efforts to proofread,  errors can occur which can change meaning.      Schuyler Amor, MD 07/19/16 [place], MD 07/19/16 [phone]

## 2016-07-19 NOTE — Progress Notes (Signed)
[place] received an OR for Ad. [place] visited and met with the Pt, but the Nurse interrupted the visit. Abie waited for the Nurses to complete their work and proceeded to educate the Pt on Ad. Pt requested for more time go over the material before she completes it. Mandan told the Pt that the he would be available to help Pt complete the Ad when she is ready.   07/19/16 1900  Clinical Encounter Type  Visited With Patient;Family  Visit Type Initial;ED;Other (Comment)  Referral From Nurse  Consult/Referral To Chaplain  Spiritual Encounters  Spiritual Needs Literature;Brochure;Other (Comment)

## 2016-07-20 LAB — CBC
HCT: 34.8 % — ABNORMAL LOW (ref 35.0–47.0)
Hemoglobin: 11.7 g/dL — ABNORMAL LOW (ref 12.0–16.0)
MCH: 31.5 pg (ref 26.0–34.0)
MCHC: 33.6 g/dL (ref 32.0–36.0)
MCV: 93.6 fL (ref 80.0–100.0)
PLATELETS: 76 10*3/uL — AB (ref 150–440)
RBC: 3.71 MIL/uL — ABNORMAL LOW (ref 3.80–5.20)
RDW: 16.2 % — AB (ref 11.5–14.5)
WBC: 4.5 10*3/uL (ref 3.6–11.0)

## 2016-07-20 LAB — COMPREHENSIVE METABOLIC PANEL
ALBUMIN: 3 g/dL — AB (ref 3.5–5.0)
ALK PHOS: 78 U/L (ref 38–126)
ALT: 19 U/L (ref 14–54)
AST: 25 U/L (ref 15–41)
Anion gap: 5 (ref 5–15)
BILIRUBIN TOTAL: 0.6 mg/dL (ref 0.3–1.2)
BUN: 12 mg/dL (ref 6–20)
CO2: 29 mmol/L (ref 22–32)
CREATININE: 0.94 mg/dL (ref 0.44–1.00)
Calcium: 8.5 mg/dL — ABNORMAL LOW (ref 8.9–10.3)
Chloride: 106 mmol/L (ref 101–111)
GFR calc Af Amer: >60 mL/min (ref >60)
GLUCOSE: 155 mg/dL — AB (ref 65–99)
Potassium: 3.9 mmol/L (ref 3.5–5.1)
Sodium: 140 mmol/L (ref 135–145)
TOTAL PROTEIN: 6.2 g/dL — AB (ref 6.5–8.1)

## 2016-07-20 LAB — GLUCOSE, CAPILLARY
GLUCOSE-CAPILLARY: 116 mg/dL — AB (ref 65–99)
GLUCOSE-CAPILLARY: 130 mg/dL — AB (ref 65–99)
Glucose-Capillary: 78 mg/dL (ref 65–99)
Glucose-Capillary: 109 mg/dL — ABNORMAL HIGH (ref 65–99)

## 2016-07-20 MED ORDER — GABAPENTIN 600 MG PO TABS
600.0000 mg | ORAL_TABLET | Freq: Two times a day (BID) | ORAL | Status: DC
  Administered 2016-07-20 – 2016-07-21 (×2): 600 mg via ORAL
  Filled 2016-07-20 (×2): qty 1

## 2016-07-20 MED ORDER — METFORMIN HCL 500 MG PO TABS: 500.0000 mg | ORAL_TABLET | Freq: Every day | ORAL | Status: DC

## 2016-07-20 MED ORDER — ALBUTEROL SULFATE HFA 108 (90 BASE) MCG/ACT IN AERS: 2.0000 | INHALATION_SPRAY | Freq: Four times a day (QID) | RESPIRATORY_TRACT | Status: DC | PRN

## 2016-07-20 MED ORDER — MOMETASONE FURO-FORMOTEROL FUM 200-5 MCG/ACT IN AERO
2.0000 | INHALATION_SPRAY | Freq: Two times a day (BID) | RESPIRATORY_TRACT | Status: DC
  Administered 2016-07-20 – 2016-07-21 (×2): 2 via RESPIRATORY_TRACT
  Filled 2016-07-20: qty 8.8

## 2016-07-20 MED ORDER — METFORMIN HCL 500 MG PO TABS
500.0000 mg | ORAL_TABLET | Freq: Every day | ORAL | Status: DC
  Administered 2016-07-20: 500 mg via ORAL
  Filled 2016-07-20: qty 1

## 2016-07-20 MED ORDER — ARIPIPRAZOLE 15 MG PO TABS
30.0000 mg | ORAL_TABLET | Freq: Every day | ORAL | Status: DC
  Administered 2016-07-20 – 2016-07-21 (×2): 30 mg via ORAL
  Filled 2016-07-20 (×3): qty 2

## 2016-07-20 MED ORDER — SPIRONOLACTONE 25 MG PO TABS
25.0000 mg | ORAL_TABLET | Freq: Every day | ORAL | Status: DC
  Administered 2016-07-20 – 2016-07-21 (×2): 25 mg via ORAL
  Filled 2016-07-20 (×2): qty 1

## 2016-07-20 MED ORDER — ROPINIROLE HCL 1 MG PO TABS
3.0000 mg | ORAL_TABLET | Freq: Every day | ORAL | Status: DC
  Administered 2016-07-20: 3 mg via ORAL
  Filled 2016-07-20: qty 3

## 2016-07-20 MED ORDER — OXYBUTYNIN CHLORIDE ER 5 MG PO TB24
5.0000 mg | ORAL_TABLET | Freq: Every day | ORAL | Status: DC
  Administered 2016-07-20 – 2016-07-21 (×2): 5 mg via ORAL
  Filled 2016-07-20 (×3): qty 1

## 2016-07-20 MED ORDER — LAMOTRIGINE 100 MG PO TABS
100.0000 mg | ORAL_TABLET | Freq: Two times a day (BID) | ORAL | Status: DC
  Administered 2016-07-20 – 2016-07-21 (×2): 100 mg via ORAL
  Filled 2016-07-20 (×2): qty 1

## 2016-07-20 MED ORDER — FERROUS SULFATE 325 (65 FE) MG PO TABS
325.0000 mg | ORAL_TABLET | Freq: Two times a day (BID) | ORAL | Status: DC
  Administered 2016-07-20 – 2016-07-21 (×2): 325 mg via ORAL
  Filled 2016-07-20 (×2): qty 1

## 2016-07-20 MED ORDER — MIRTAZAPINE 15 MG PO TABS
15.0000 mg | ORAL_TABLET | Freq: Every day | ORAL | Status: DC
  Administered 2016-07-20: 15 mg via ORAL
  Filled 2016-07-20: qty 1

## 2016-07-20 MED ORDER — METFORMIN HCL 500 MG PO TABS: 1000.0000 mg | ORAL_TABLET | Freq: Every day | ORAL | Status: DC

## 2016-07-20 MED ORDER — DICYCLOMINE HCL 10 MG PO CAPS
10.0000 mg | ORAL_CAPSULE | Freq: Three times a day (TID) | ORAL | Status: DC
  Administered 2016-07-20 – 2016-07-21 (×3): 10 mg via ORAL
  Filled 2016-07-20 (×3): qty 1

## 2016-07-20 MED ORDER — CLONAZEPAM 0.5 MG PO TABS
0.2500 mg | ORAL_TABLET | Freq: Three times a day (TID) | ORAL | Status: DC
  Administered 2016-07-20 – 2016-07-21 (×3): 0.25 mg via ORAL
  Filled 2016-07-20 (×3): qty 1

## 2016-07-20 MED ORDER — LACTULOSE 10 GM/15ML PO SOLN
10.0000 g | ORAL | Status: DC
  Administered 2016-07-20: 10 g via ORAL
  Filled 2016-07-20: qty 30

## 2016-07-20 MED ORDER — SIMVASTATIN 20 MG PO TABS
10.0000 mg | ORAL_TABLET | Freq: Every day | ORAL | Status: DC
  Administered 2016-07-20: 10 mg via ORAL
  Filled 2016-07-20: qty 1

## 2016-07-20 MED ORDER — LORAZEPAM 0.5 MG PO TABS: 0.5000 mg | ORAL_TABLET | Freq: Every day | ORAL | Status: DC | PRN

## 2016-07-20 MED ORDER — ACETAMINOPHEN 325 MG PO TABS: 650.0000 mg | ORAL_TABLET | Freq: Four times a day (QID) | ORAL | Status: DC | PRN

## 2016-07-20 MED ORDER — ALBUTEROL SULFATE (2.5 MG/3ML) 0.083% IN NEBU: 2.5000 mg | INHALATION_SOLUTION | Freq: Four times a day (QID) | RESPIRATORY_TRACT | Status: DC | PRN

## 2016-07-20 MED ORDER — FUROSEMIDE 40 MG PO TABS
80.0000 mg | ORAL_TABLET | Freq: Every day | ORAL | Status: DC
  Administered 2016-07-20 – 2016-07-21 (×2): 80 mg via ORAL
  Filled 2016-07-20 (×2): qty 2

## 2016-07-20 MED ORDER — METOCLOPRAMIDE HCL 10 MG PO TABS
5.0000 mg | ORAL_TABLET | Freq: Three times a day (TID) | ORAL | Status: DC
  Administered 2016-07-20 – 2016-07-21 (×4): 5 mg via ORAL
  Filled 2016-07-20 (×4): qty 1

## 2016-07-20 MED ORDER — PREGABALIN 50 MG PO CAPS
150.0000 mg | ORAL_CAPSULE | Freq: Three times a day (TID) | ORAL | Status: DC
  Administered 2016-07-20 – 2016-07-21 (×3): 150 mg via ORAL
  Filled 2016-07-20 (×3): qty 3

## 2016-07-20 MED ORDER — PANTOPRAZOLE SODIUM 40 MG PO TBEC
40.0000 mg | DELAYED_RELEASE_TABLET | Freq: Every day | ORAL | Status: DC
Start: 2016-07-20 — End: 2016-07-21
  Administered 2016-07-20 – 2016-07-21 (×2): 40 mg via ORAL
  Filled 2016-07-20 (×2): qty 1

## 2016-07-20 MED ORDER — BUPROPION HCL ER (SR) 150 MG PO TB12
150.0000 mg | ORAL_TABLET | Freq: Every day | ORAL | Status: DC
  Administered 2016-07-20 – 2016-07-21 (×2): 150 mg via ORAL
  Filled 2016-07-20 (×2): qty 1

## 2016-07-20 NOTE — Evaluation (Signed)
Physical Therapy Evaluation Patient Details Name: Stephanie [name] MRN: [id number] DOB: 1950-08-29 Today's Date: 07/20/2016   History of Present Illness  Pt admitted to [facility] w/ hypoglycemia, hypothermia and lethargy. Has an extensive medical history of; diabetes, HLD, obstructive sleep apnea, HTN, and cirrohsis     Clinical Impression  Pt awake, alert and willing to participate in PT eval. She complained of R knee pain 4/10 at rest, pain increased especially when transferring from sit to stand. Patient was on room air upon entering and O2 sat stayed above 92% throughout session. Overall UE appears WFLs for patient, L LE strength was grossly 4+/5, R LE was impaired secondary to R knee pain grossly at least 3/5. Pt required min assist in bed mobility to help bring trunk to upright position. Patient able to stand w/ RW and min assist and requires increase time to obtain upright posture due to R knee pain and decreased strength. Patient ambulated w/ RW and min guarding 12' in her room and was limited by pain and fatigue. Patient lives at [facility] and is only able to ambulate around her room at baseline, she is able to dress and feed herself but requires assistance for higher level ADLs such as bathing and transportation to dining hall. Currently patient is limited in functional mobility secondary to decreased strength/ROM, obesity, and R knee pain, she will benefit from skilled PT to correct deficits. Recommend patient receive HHPT at [facility] following acute hospital stay.     Follow Up Recommendations Home health PT    Equipment Recommendations  None recommended by PT (pt equiped w/ RW and home equipment)    Recommendations for Other Services OT consult     Precautions / Restrictions Precautions Precautions: Fall Restrictions Weight Bearing Restrictions: No      Mobility  Bed Mobility Overal bed mobility: Needs Assistance Bed Mobility: Supine to Sit     Supine to sit: HOB elevated;Min  assist     General bed mobility comments: requires increased time for scooting of LE and min assist to help bring trunk to upright position   Transfers Overall transfer level: Needs assistance Equipment used: Rolling walker (2 wheeled) Transfers: Sit to/from Stand Sit to Stand: Min assist         General transfer comment: increased R knee pain when performing transfers, requires increased time to obtain upright posture, and heavy use of UE'sduring push off  Ambulation/Gait Ambulation/Gait assistance: Min guard Ambulation Distance (Feet): 12 Feet Assistive device: Rolling walker (2 wheeled) Gait Pattern/deviations: Step-to pattern;Antalgic;Decreased stride length   Gait velocity interpretation: Below normal speed for age/gender General Gait Details: increased pain and work of breathing during ambulation, O2 sat remained above 92%, takes small short steps, no LOB or staggering   Stairs            Wheelchair Mobility    Modified Rankin (Stroke Patients Only)       Balance Overall balance assessment: Needs assistance Sitting-balance support: Feet supported;No upper extremity supported Sitting balance-Leahy Scale: Good Sitting balance - Comments: slight lateral lean to right    Standing balance support: Bilateral upper extremity supported Standing balance-Leahy Scale: Fair Standing balance comment: RW required for additional stability,                              Pertinent Vitals/Pain Pain Assessment: 0-10 Pain Score: 4  Pain Location: R knee Pain Descriptors / Indicators: Aching Pain Intervention(s): Monitored during  session;Limited activity within patient's tolerance    Home Living Family/patient expects to be discharged to:: Assisted living [facility])               Home Equipment: Gilford Rile - 2 wheels      Prior Function Level of Independence: Needs assistance         Comments: able to perform room ambulation and dressing and feeding,  requires assistance bathing and transporting to dining hall      Hand Dominance        Extremity/Trunk Assessment   Upper Extremity Assessment Upper Extremity Assessment: Overall WFL for tasks assessed    Lower Extremity Assessment Lower Extremity Assessment: Generalized weakness;RLE deficits/detail (L LE grossly 4+/5) RLE Deficits / Details: decreased PF strength at least 3+/5 RLE: Unable to fully assess due to pain (R knee pain prevented further MMT )       Communication   Communication: No difficulties  Cognition Arousal/Alertness: Awake/alert Behavior During Therapy: WFL for tasks assessed/performed Overall Cognitive Status: Within Functional Limits for tasks assessed                      General Comments      Exercises     Assessment/Plan    PT Assessment Patient needs continued PT services  PT Problem List Decreased strength;Decreased range of motion;Decreased activity tolerance;Decreased balance;Decreased mobility;Decreased knowledge of use of DME;Obesity;Pain          PT Treatment Interventions DME instruction;Gait training;Functional mobility training;Balance training;Therapeutic exercise;Therapeutic activities;Patient/family education    PT Goals (Current goals can be found in the Care Plan section)  Acute Rehab PT Goals Patient Stated Goal: Return home  PT Goal Formulation: With patient Time For Goal Achievement: 08/03/16 Potential to Achieve Goals: Good    Frequency Min 2X/week   Barriers to discharge        Co-evaluation               End of Session Equipment Utilized During Treatment: Gait belt Activity Tolerance: Patient limited by pain;Patient limited by fatigue Patient left: in chair;with call bell/phone within reach;with chair alarm set           Time: 1346-1405 PT Time Calculation (min) (ACUTE ONLY): 19 min   Charges:         PT G Codes:        [employer] Student PT 07/20/2016, 2:30 PM

## 2016-07-20 NOTE — Progress Notes (Signed)
Pt placed on [facility] CPAP C-4. CPAP plugged into red outlet. Pt tolerating well.  

## 2016-07-20 NOTE — Progress Notes (Signed)
[facility]                                                                                                                                                                                  Patient Demographics   Stephanie [name], is a 66 y.o. female, DOB - 07/12/1950, HEN:[id number]  Admit date - 07/19/2016   Admitting Physician Nicholes Mango, MD  Outpatient Primary MD for the patient is Sarajane Jews, MD   LOS - 1  Subjective: Patient admitted with hypoglycemia and altered mental status,states has not had appetite  Didn't eat much breakfast this am   Review of Systems:   CONSTITUTIONAL: No documented fever. No fatigue, weakness. No weight gain, no weight loss.  EYES: No blurry or double vision.  ENT: No tinnitus. No postnasal drip. No redness of the oropharynx.  RESPIRATORY: No cough, no wheeze, no hemoptysis. No dyspnea.  CARDIOVASCULAR: No chest pain. No orthopnea. No palpitations. No syncope.  GASTROINTESTINAL: No nausea, no vomiting or diarrhea. No abdominal pain. No melena or hematochezia.  GENITOURINARY: No dysuria or hematuria.  ENDOCRINE: No polyuria or nocturia. No heat or cold intolerance.  HEMATOLOGY: No anemia. No bruising. No bleeding.  INTEGUMENTARY: No rashes. No lesions.  MUSCULOSKELETAL: No arthritis. No swelling. No gout.  NEUROLOGIC: No numbness, tingling, or ataxia. No seizure-type activity.  PSYCHIATRIC: No anxiety. No insomnia. No ADD.    Vitals:   Vitals:   07/19/16 1920 07/20/16 0415 07/20/16 0618 07/20/16 1149  BP: (!) 109/54 (!) 105/47  (!) 118/56  Pulse: 78 66  82  Resp: 16 16  18   Temp: 98.5 F (36.9 C) 98.3 F (36.8 C)  98.6 F (37 C)  TempSrc: Oral Oral  Oral  SpO2: 98% 100% 96% 96%  Weight:      Height:        Wt Readings from Last 3 Encounters:  07/19/16 225 lb (102.1 kg)  07/14/16 225 lb (102.1 kg)  07/12/16 223 lb (101.2 kg)     Intake/Output Summary (Last 24 hours) at 07/20/16 1251 Last data  filed at 07/20/16 3614  Gross per 24 hour  Intake           414.56 ml  Output             2000 ml  Net         -1585.44 ml    Physical Exam:   GENERAL: Pleasant-appearing in no apparent distress.  HEAD, EYES, EARS, NOSE AND THROAT: Atraumatic, normocephalic. Extraocular muscles are intact. Pupils equal and reactive to light. Sclerae anicteric. No conjunctival injection. No oro-pharyngeal erythema.  NECK: Supple. There is no jugular venous distention.  No bruits, no lymphadenopathy, no thyromegaly.  HEART: Regular rate and rhythm,. No murmurs, no rubs, no clicks.  LUNGS: Clear to auscultation bilaterally. No rales or rhonchi. No wheezes.  ABDOMEN: Soft, flat, nontender, nondistended. Has good bowel sounds. No hepatosplenomegaly appreciated.  EXTREMITIES: No evidence of any cyanosis, clubbing, or peripheral edema.  +2 pedal and radial pulses bilaterally.  NEUROLOGIC: The patient is alert, awake, and oriented x3 with no focal motor or sensory deficits appreciated bilaterally.  SKIN: Moist and warm with no rashes appreciated.  Psych: Not anxious, depressed LN: No inguinal LN enlargement    Antibiotics   Anti-infectives    None      Medications   Scheduled Meds: . enoxaparin (LOVENOX) injection  40 mg Subcutaneous BID  . insulin aspart  0-5 Units Subcutaneous QHS  . insulin aspart  0-9 Units Subcutaneous TID WC  . sodium chloride flush  3 mL Intravenous Q12H   Continuous Infusions: PRN Meds:.acetaminophen **OR** acetaminophen, ondansetron **OR** ondansetron (ZOFRAN) IV, oxyCODONE, traMADol   Data Review:   Micro Results Recent Results (from the past 240 hour(s))  MRSA PCR Screening     Status: None   Collection Time: 07/19/16  4:56 PM  Result Value Ref Range Status   MRSA by PCR NEGATIVE NEGATIVE Final    Comment:        The GeneXpert MRSA Assay (FDA approved for NASAL specimens only), is one component of a comprehensive MRSA colonization surveillance program. It is  not intended to diagnose MRSA infection nor to guide or monitor treatment for MRSA infections.     Radiology Reports Dg Chest 2 View  Result Date: 07/19/2016 CLINICAL DATA:  L status change, un clear Seth with speech, weakness, hypoglycemia. History of previous CVA, diabetes, asthma, cirrhosis, discontinued smoking four years ago after many years. EXAM: CHEST  2 VIEW COMPARISON:  Chest x-ray of April 18, 2016 FINDINGS: The lungs are reasonably well inflated. The interstitial markings are coarse. There is no alveolar infiltrate or pleural effusion. The cardiac silhouette is enlarged. The pulmonary vascularity is mildly engorged. The bony thorax exhibits no acute abnormality. A nerve stimulator electrode projects over the lower thoracic spinal canal. IMPRESSION: CHF superimposed upon COPD and mild chronic interstitial prominence. No definite acute pneumonia. Electronically Signed   By: David  Martinique M.D.   On: 07/19/2016 10:28   Dg Thoracic Spine 2 View  Result Date: 07/14/2016 CLINICAL DATA:  Patient fell off of a chair.  Pain. EXAM: THORACIC SPINE 2 VIEWS COMPARISON:  CT 08/23/2016. FINDINGS: Neural stimulator device projects up to the T8-T9 disc level. No acute fracture of the thoracic spine. Small anterior osteophytes are noted mild disc space narrowing. The patient is status post low anterior cervical disc fusion from C5 through C7. IMPRESSION: No acute osseous abnormality the thoracic spine. Neural stimulator device projects up to the T8-T9 level. ACDF of the lower cervical spine. Electronically Signed   By: Ashley Royalty M.D.   On: 07/14/2016 19:44   Dg Shoulder Right  Result Date: 07/14/2016 CLINICAL DATA:  Patient fell from chair.  Pain. EXAM: RIGHT SHOULDER - 2+ VIEW COMPARISON:  09/01/2015 radiographs of the right shoulder FINDINGS: Osteoarthritis of the [facility] joint with adjacent ossicles. Osteoarthritic joint space narrowing and sclerosis of the glenohumeral joint without malalignment. No  acute displaced fracture or bone destruction. Previously suspected nondisplaced fracture at the base the greater tuberosity is less apparent on current exam. Patient status post C5 through C7 ACDF. Adjacent ribs and lung are nonacute.  IMPRESSION: No acute osseous abnormality or malalignment. AC and glenohumeral joint osteoarthritis. ACDF of the cervical spine. Electronically Signed   By: Ashley Royalty M.D.   On: 07/14/2016 20:01   Dg Ankle Complete Right  Result Date: 07/14/2016 CLINICAL DATA:  Right ankle pain after fall from chair EXAM: RIGHT ANKLE - COMPLETE 3+ VIEW COMPARISON:  None. FINDINGS: There is mild soft tissue swelling about the leg, malleoli and dorsum of the midfoot. No ankle joint effusion. The ankle mortise is maintained. The subtalar joint is unremarkable. Growth arrest lines of the distal tibial diaphysis. There is a small plantar calcaneal enthesophyte. On the lateral view, there is cortical irregularity of a metatarsal bone incompletely included with sclerotic appearing margins suggesting old remote trauma. If the patient is symptomatic over the midfoot, would recommend dedicated foot radiographs for better assessment. IMPRESSION: No acute displaced appearing fracture. Soft tissue swelling of the leg, malleoli and midfoot. On the lateral projection, there is deformity of one of the metarsals with sclerotic appearing margins suggestive of an old remote injury though not confirmed on the additional views. If the patient however is symptomatic over the midfoot, dedicated right foot radiographs are recommended for better assessment. Electronically Signed   By: Ashley Royalty M.D.   On: 07/14/2016 19:57   Ct Head Wo Contrast  Result Date: 07/19/2016 CLINICAL DATA:  Altered mental status beginning this morning. Altered speech. EXAM: CT HEAD WITHOUT CONTRAST TECHNIQUE: Contiguous axial images were obtained from the base of the skull through the vertex without intravenous contrast. COMPARISON:  Head  CT scan 07/14/2016 and 03/21/2015. FINDINGS: Brain: Appears normal without hemorrhage, infarct, mass lesion, mass effect, midline shift or abnormal extra-axial fluid collection. No hydrocephalus or pneumocephalus. Vascular: Atherosclerosis noted. Skull: Intact. Sinuses/Orbits: Status post bilateral lens extraction. Otherwise negative. Other: None. IMPRESSION: No acute abnormality. Atherosclerosis. Electronically Signed   By: Inge Rise M.D.   On: 07/19/2016 10:18   Ct Head Wo Contrast  Result Date: 07/14/2016 CLINICAL DATA:  Fall.  Fall from chair.  Neck pain. EXAM: CT HEAD WITHOUT CONTRAST CT CERVICAL SPINE WITHOUT CONTRAST TECHNIQUE: Multidetector CT imaging of the head and cervical spine was performed following the standard protocol without intravenous contrast. Multiplanar CT image reconstructions of the cervical spine were also generated. COMPARISON:  Neck CT 09/01/2015 FINDINGS: CT HEAD FINDINGS Brain: No intracranial hemorrhage. No parenchymal contusion. No midline shift or mass effect. Basilar cisterns are patent. No skull base fracture. No fluid in the paranasal sinuses or mastoid air cells. Orbits are normal. Vascular: No hyperdense vessel or unexpected calcification. Skull: Normal. Negative for fracture or focal lesion. Sinuses/Orbits: Paranasal sinuses and mastoid air cells are clear. Orbits are clear. Other: None. CT CERVICAL SPINE FINDINGS Alignment: Normal alignment Skull base and vertebrae: Normal craniocervical junction. Anterior cervical fusion from C5-C7. No acute loss vertebral body height and disc height. No subluxation. Soft tissues and spinal canal: No epidural paraspinal hematoma. Disc levels:  Unremarkable Upper chest: Clear Other: None IMPRESSION: 1. No intracranial trauma. 2. No acute cervical spine findings. 3. Anterior cervical fusion without complication. Launch template CT CAP with Electronically Signed   By: Suzy Bouchard M.D.   On: 07/14/2016 20:03   Ct Chest Wo  Contrast  Result Date: 06/28/2016 CLINICAL DATA:  Followup pulmonary nodules. EXAM: CT CHEST WITHOUT CONTRAST TECHNIQUE: Multidetector CT imaging of the chest was performed following the standard protocol without IV contrast. COMPARISON:  Multiple prior chest CTs. The most recent is 04/04/2016 FINDINGS: Chest wall: No breast  masses, supraclavicular or axillary lymphadenopathy. The thyroid gland appears normal. Cardiovascular: The heart is normal in size. No pericardial effusion. Minimal scattered atherosclerotic calcifications involving the aorta. Three-vessel coronary artery calcifications appear stable. Mediastinum/Nodes: Small scattered mediastinal and hilar lymph nodes are stable. No mass or overt adenopathy. The esophagus is grossly normal. Lungs/Pleura: Stable mild emphysematous changes and pulmonary scarring. No infiltrates, edema or effusions. No worrisome pulmonary lesions or definite pulmonary nodules. Right apical density has the appearance of scarring change and measures a maximum of 10 mm on image 18. This appears stable. Upper Abdomen: Stable advanced cirrhotic changes involving the liver along with portal venous hypertension, portal venous collaterals and splenomegaly. No obvious hepatic mass and no ascites. Musculoskeletal: No significant bony findings. A spinal cord stimulator is noted in the mid thoracic spine and cervical spine fusion hardware is noted. IMPRESSION: 1. Stable emphysematous changes and pulmonary scarring. No worrisome pulmonary lesions or acute pulmonary findings. 2. Stable small scattered mediastinal and hilar lymph nodes but no mass or adenopathy. 3. Stable coronary artery calcifications. 4. Stable cirrhotic changes involving the liver along with portal venous hypertension, portal venous collaterals and splenomegaly. Electronically Signed   By: Marijo Sanes M.D.   On: 06/28/2016 10:41   Ct Cervical Spine Wo Contrast  Result Date: 07/14/2016 CLINICAL DATA:  Fall.  Fall from  chair.  Neck pain. EXAM: CT HEAD WITHOUT CONTRAST CT CERVICAL SPINE WITHOUT CONTRAST TECHNIQUE: Multidetector CT imaging of the head and cervical spine was performed following the standard protocol without intravenous contrast. Multiplanar CT image reconstructions of the cervical spine were also generated. COMPARISON:  Neck CT 09/01/2015 FINDINGS: CT HEAD FINDINGS Brain: No intracranial hemorrhage. No parenchymal contusion. No midline shift or mass effect. Basilar cisterns are patent. No skull base fracture. No fluid in the paranasal sinuses or mastoid air cells. Orbits are normal. Vascular: No hyperdense vessel or unexpected calcification. Skull: Normal. Negative for fracture or focal lesion. Sinuses/Orbits: Paranasal sinuses and mastoid air cells are clear. Orbits are clear. Other: None. CT CERVICAL SPINE FINDINGS Alignment: Normal alignment Skull base and vertebrae: Normal craniocervical junction. Anterior cervical fusion from C5-C7. No acute loss vertebral body height and disc height. No subluxation. Soft tissues and spinal canal: No epidural paraspinal hematoma. Disc levels:  Unremarkable Upper chest: Clear Other: None IMPRESSION: 1. No intracranial trauma. 2. No acute cervical spine findings. 3. Anterior cervical fusion without complication. Launch template CT CAP with Electronically Signed   By: Suzy Bouchard M.D.   On: 07/14/2016 20:03   Dg Knee Complete 4 Views Right  Result Date: 07/14/2016 CLINICAL DATA:  Right knee pain after fall from chair EXAM: RIGHT KNEE - COMPLETE 4+ VIEW COMPARISON:  MRI of the knee from 05/23/2016 FINDINGS: Small suprapatellar joint effusion. Minor degenerative joint space narrowing of the femorotibial compartment with chondrocalcinosis of hyaline cartilage. Joint space narrowing and spurring also noted of the patellofemoral compartment. No acute displaced fracture. No intra-articular loose bodies. 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 No acute fracture noted. Electronically Signed   By: Ashley Royalty M.D.   On: 07/14/2016 19:50   Dg Hip Unilat W Or Wo Pelvis 2-3 Views Right  Result Date: 07/14/2016 CLINICAL DATA:  Right hip pain after fall from chair EXAM: DG HIP (WITH OR WITHOUT PELVIS) 2-3V RIGHT COMPARISON:  05/31/2016 FINDINGS: There is no evidence of hip fracture or dislocation. L3-4 bilateral pedicle screw fixation with interbody block noted. Lower lumbar facet arthropathy L4-5 bilaterally. The SI  joints and pubic symphysis are maintained. No pelvic fracture. Hip joints are intact with slight joint space narrowing bilaterally. IMPRESSION: No acute radiographic abnormality of the bony pelvis and right hip. L3-4 pedicle screw fixation with interbody block. Electronically Signed   By: Ashley Royalty M.D.   On: 07/14/2016 19:45     CBC  Recent Labs Lab 07/19/16 1111 07/20/16 0428  WBC 5.6 4.5  HGB 11.7* 11.7*  HCT 35.2 34.8*  PLT 81* 76*  MCV 92.7 93.6  MCH 31.0 31.5  MCHC 33.4 33.6  RDW 16.2* 16.2*  LYMPHSABS 0.4*  --   MONOABS 0.5  --   EOSABS 0.0  --   BASOSABS 0.0  --     Chemistries   Recent Labs Lab 07/19/16 0913 07/20/16 0428  NA 138 140  K 3.5 3.9  CL 101 106  CO2 31 29  GLUCOSE 193* 155*  BUN 17 12  CREATININE 1.05* 0.94  CALCIUM 9.0 8.5*  AST 34 25  ALT 19 19  ALKPHOS 82 78  BILITOT 0.7 0.6   ------------------------------------------------------------------------------------------------------------------ estimated creatinine clearance is 66.8 mL/min (by C-G formula based on SCr of 0.94 mg/dL). ------------------------------------------------------------------------------------------------------------------ No results for input(s): HGBA1C in the last 72 hours. ------------------------------------------------------------------------------------------------------------------ No results for input(s): CHOL, HDL, LDLCALC, TRIG, CHOLHDL, LDLDIRECT in the last 72  hours. ------------------------------------------------------------------------------------------------------------------ No results for input(s): TSH, T4TOTAL, T3FREE, THYROIDAB in the last 72 hours.  Invalid input(s): FREET3 ------------------------------------------------------------------------------------------------------------------ No results for input(s): VITAMINB12, FOLATE, FERRITIN, TIBC, IRON, RETICCTPCT in the last 72 hours.  Coagulation profile No results for input(s): INR, PROTIME in the last 168 hours.  No results for input(s): DDIMER in the last 72 hours.  Cardiac Enzymes No results for input(s): CKMB, TROPONINI, MYOGLOBIN in the last 168 hours.  Invalid input(s): CK ------------------------------------------------------------------------------------------------------------------ Invalid input(s): [place]   Stephanie [name]  is a 66 y.o. female with a known history of Diabetes mellitus, hyperlipidemia, obstructive sleep apnea, essential hypertension, liver cirrhosis and multiple other medical problems sent over to the emergency department from the nursing home for low blood sugar. According to the EMS and patient was altered and blood sugar was found to be at 50. Patient was given 1 amp of D50 and an sugar came up to normal range at that point and she is brought into the emergency department. In the ED blood sugar was at 88 Patient was found to be hypothermic, covered with Bair hugger   #Acute metabolic encephalopathy secondary to hypoglycemia  In the status improved blood sugars improved   # hypoglycemia Due to patient not eating her meals evening prior. Patient not very hungry this morning and did not eat much. I will continue to hold her insulin will monitor sugars for today  #Hypothermia suspect related to her hypoglycemia now temperature normal  #History of obstructive sleep apnea CPAP daily at bedtime  Provide GI and DVT  prophylaxis     Code Status Orders        Start     Ordered   07/19/16 1455  Full code  Continuous     07/19/16 1454    Code Status History    Date Active Date Inactive Code Status Order ID Comments User Context   04/18/2016  1:11 PM 04/21/2016  2:38 PM Full Code [id number]  Gladstone Lighter, MD ED   04/04/2016  3:31 PM 04/05/2016  9:38 PM Full Code [id number]  Loletha Grayer, MD ED   09/10/2015  2:04 PM 09/11/2015  6:33  PM Full Code [id number]  Yves Dill, RN Inpatient   09/09/2015 11:14 PM 09/10/2015  2:04 PM DNR [id number]  Demetrios Loll, MD ED           Consults  None   DVT Prophylaxis SCDs due to thrombocytopenia  Lab Results  Component Value Date   PLT 76 (L) 07/20/2016     Time Spent in minutes   6mn  Greater than 50% of time spent in care coordination and counseling patient regarding the condition and plan of care.   PDustin FlockM.D on 07/20/2016 at 12:51 PM  Between 7am to 6pm - Pager - [phone]  After 6pm go to [email or web address] - password [email or web address]   Office  [phone]

## 2016-07-21 LAB — GLUCOSE, CAPILLARY
GLUCOSE-CAPILLARY: 102 mg/dL — AB (ref 65–99)
GLUCOSE-CAPILLARY: 136 mg/dL — AB (ref 65–99)

## 2016-07-21 LAB — INFLUENZA PANEL BY PCR (TYPE A & B)
INFLBPCR: NEGATIVE
Influenza A By PCR: NEGATIVE

## 2016-07-21 LAB — HEMOGLOBIN A1C
Hgb A1c MFr Bld: 5.2 % (ref 4.8–5.6)
Mean Plasma Glucose: 103 mg/dL

## 2016-07-21 MED ORDER — OXYCODONE HCL 5 MG PO TABS: 5.0000 mg | ORAL_TABLET | Freq: Two times a day (BID) | ORAL | 0 refills | Status: DC

## 2016-07-21 MED ORDER — LORAZEPAM 0.5 MG PO TABS: 0.5000 mg | ORAL_TABLET | Freq: Every day | ORAL | 0 refills | Status: DC | PRN

## 2016-07-21 NOTE — Clinical Social Work Note (Signed)
Clinical Social Work Assessment  Patient Details  Name: Stephanie [name] MRN: [id number] Date of Birth: [birth year]  Date of referral:  07/21/16               Reason for consult:  Facility Placement                Permission sought to share information with:  Facility Sport and exercise psychologist, Family Supports Permission granted to share information::  Yes, Verbal Permission Granted  Name::     [name] [phone]  [phone]   Agency::  [facility]  Relationship::     Contact Information:     Housing/Transportation Living arrangements for the past 2 months:  [place] of Information:  Patient Patient Interpreter Needed:  None Criminal Activity/Legal Involvement Pertinent to Current Situation/Hospitalization:  No - Comment as needed Significant Relationships:  None Lives with:  Facility Resident Do you feel safe going back to the place where you live?  Yes Need for family participation in patient care:  No (Coment)  Care giving concerns: Patient expressed that she does not have any concerns about returning back to ALF.   Social Worker assessment / plan:  Patient is a 66 year old female who is alert and oriented x4 and is from [facility].  Patient states she has been living at ALF for over a year and she is satisfied with her care at ALF.  Patient expressed that she was receiving home health at ALF and is okay with having it continued.  CSW updated case manager, patient did not express any questions or concerns about returning back to home at [facility].  Employment status:  Retired Nurse, adult PT Recommendations:  Home with [place] / Referral to community resources:     Patient/Family's Response to care:  Patient in agreement to returning back to [facility].  Patient/Family's Understanding of and Emotional Response to Diagnosis, Current Treatment, and Prognosis:  Patient is aware of current  treatment plan and prognosis.  Emotional Assessment Appearance:  Appears stated age Attitude/Demeanor/Rapport:    Affect (typically observed):  Appropriate, Stable, Calm, Pleasant Orientation:  Oriented to Self, Oriented to Place, Oriented to  Time, Oriented to Situation Alcohol / Substance use:  Not Applicable Psych involvement (Current and /or in the community):  No (Comment)  Discharge Needs  Concerns to be addressed:  No discharge needs identified Readmission within the last 30 days:  No Current discharge risk:  None Barriers to Discharge:  No Barriers Identified   Ross Ludwig, LCSWA 07/21/2016, 2:40 PM

## 2016-07-21 NOTE — Progress Notes (Signed)
[place] responded to a page for a Pt who had requested for Ad. [place] visited the Pt who was in the Rm with her daughter-in-law. [facility] brought the [employer] and witnesses, but the witnesses could not go in the Rm because the Pt was in isolation. [place] follow-up this Pt later, gathered the witnesses and [employer], asked the Pt to move to the door and she completed the Ad. [place] placed a copy of the AD in the Pt's file and gave the Pt the original and 2 copies for her personal recordkeeping. Pt was later discharged.    07/21/16 1400  Clinical Encounter Type  Visited With Patient;Patient and family together  Visit Type Follow-up;ED  Referral From Nurse  Consult/Referral To Chaplain  Spiritual Encounters  Spiritual Needs Literature;Brochure  Stress Factors  Patient Stress Factors None identified  Family Stress Factors None identified  Advance Directives (For Healthcare)  Does Patient Have a Medical Advance Directive? Yes  Does patient want to make changes to medical advance directive? Yes (Inpatient - patient requests chaplain consult to change a medical advance directive)  Type of Advance Directive [place];Living will  Copy of [place] in Chart? Yes  Copy of Living Will in Chart? Yes  Would patient like information on creating a medical advance directive? Yes (Inpatient - patient requests chaplain consult to create a medical advance directive)  [place] Directives  Does Patient Have a Mental Health Advance Directive? No

## 2016-07-21 NOTE — Clinical Social Work Note (Signed)
Patient to be d/c'ed today to [facility].  Patient agreeable to plans will transport via facility transportation from [facility].  Evette Cristal, MSW, [place]

## 2016-07-21 NOTE — Discharge Summary (Signed)
[place] at [facility], 66 y.o., DOB 07-21-1950, MRN [id number]. Admission date: 07/19/2016 Discharge Date 07/21/2016 Primary MD Sarajane Jews, MD Admitting Physician Nicholes Mango, MD  Admission Diagnosis  Hypoglycemia [E16.2]  Discharge Diagnosis   Active Problems: 1.  Encephalopathy due to hypoglycemia 2.  Hypoglycemia due to patient did not eat well night prior. Her hemoglobin A1c is 5.2 without have been in the low 100s.all diabetic medications have been stopped this will need to be followed by primary md and restarted on meds based on BG 3. Hypothermia suspect due to hypoglycemia temperature now normal 4. Sleep apnea continue cpap qhs 5. generalized anxiety disorder 6. Diabetes type 2 as above         Hospital CourseSharon [name]  is a 66 y.o. female with a known history of Diabetes mellitus, hyperlipidemia, obstructive sleep apnea, essential hypertension, liver cirrhosis and multiple other medical problems sent over to the emergency department from the nursing home for low blood sugar. According to the EMS and patient was altered and blood sugar was found to be at 50. Patient was given 1 amp of D50 and an sugar came up to normal range at that point and she is brought into the emergency department. In the ED blood sugar was at 88 Patient was found to be hypothermic, covered with Bare hugger. Patient's chest x-ray and other evaluation showed no evidence of infection. She was admitted to the hospital and  her blood sugars monitor. Blood sugar remained stable but in the 100 range. Her hemoglobin A1c is 5.2. Patient has been discontinued She will need to have her blood sugars checked prior to each meal and at bedtime. And a record Her Primary Care Provider Can Review This and Resume her insulin and diabetic medications as felt  approtpriate.              Consults  None  Significant Tests:  See full reports for all details     Dg  Chest 2 View  Result Date: 07/19/2016 CLINICAL DATA:  L status change, un clear Seth with speech, weakness, hypoglycemia. History of previous CVA, diabetes, asthma, cirrhosis, discontinued smoking four years ago after many years. EXAM: CHEST  2 VIEW COMPARISON:  Chest x-ray of April 18, 2016 FINDINGS: The lungs are reasonably well inflated. The interstitial markings are coarse. There is no alveolar infiltrate or pleural effusion. The cardiac silhouette is enlarged. The pulmonary vascularity is mildly engorged. The bony thorax exhibits no acute abnormality. A nerve stimulator electrode projects over the lower thoracic spinal canal. IMPRESSION: CHF superimposed upon COPD and mild chronic interstitial prominence. No definite acute pneumonia. Electronically Signed   By: David  Martinique M.D.   On: 07/19/2016 10:28   Dg Thoracic Spine 2 View  Result Date: 07/14/2016 CLINICAL DATA:  Patient fell off of a chair.  Pain. EXAM: THORACIC SPINE 2 VIEWS COMPARISON:  CT 08/23/2016. FINDINGS: Neural stimulator device projects up to the T8-T9 disc level. No acute fracture of the thoracic spine. Small anterior osteophytes are noted mild disc space narrowing. The patient is status post low anterior cervical disc fusion from C5 through C7. IMPRESSION: No acute osseous abnormality the thoracic spine. Neural stimulator device projects up to the T8-T9 level. ACDF of the lower cervical spine. Electronically Signed   By: Ashley Royalty M.D.   On: 07/14/2016 19:44   Dg Shoulder Right  Result Date: 07/14/2016 CLINICAL DATA:  Patient fell from chair.  Pain. 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 Osteoarthritic joint space narrowing and sclerosis of the glenohumeral joint without malalignment. No acute displaced fracture or bone destruction. Previously suspected nondisplaced fracture at the base the greater tuberosity is less  apparent on current exam. Patient status post C5 through C7 ACDF. Adjacent ribs and lung are nonacute. IMPRESSION: No acute osseous abnormality or malalignment. AC and glenohumeral joint osteoarthritis. ACDF of the cervical spine. Electronically Signed   By: Ashley Royalty M.D.   On: 07/14/2016 20:01   Dg Ankle Complete Right  Result Date: 07/14/2016 CLINICAL DATA:  Right ankle pain after fall from chair EXAM: RIGHT ANKLE - COMPLETE 3+ VIEW COMPARISON:  None. FINDINGS: There is mild soft tissue swelling about the leg, malleoli and dorsum of the midfoot. No ankle joint effusion. The ankle mortise is maintained. The subtalar joint is unremarkable. Growth arrest lines of the distal tibial diaphysis. There is a small plantar calcaneal enthesophyte. On the lateral view, there is cortical irregularity of a metatarsal bone incompletely included with sclerotic appearing margins suggesting old remote trauma. If the patient is symptomatic over the midfoot, would recommend dedicated foot radiographs for better assessment. IMPRESSION: No acute displaced appearing fracture. Soft tissue swelling of the leg, malleoli and midfoot. On the lateral projection, there is deformity of one of the metarsals with sclerotic appearing margins suggestive of an old remote injury though not confirmed on the additional views. If the patient however is symptomatic over the midfoot, dedicated right foot radiographs are recommended for better assessment. Electronically Signed   By: Ashley Royalty M.D.   On: 07/14/2016 19:57   Ct Head Wo Contrast  Result Date: 07/19/2016 CLINICAL DATA:  Altered mental status beginning this morning. Altered speech. EXAM: CT HEAD WITHOUT CONTRAST TECHNIQUE: Contiguous axial images were obtained from the base of the skull through the vertex without intravenous contrast. COMPARISON:  Head CT scan 07/14/2016 and 03/21/2015. FINDINGS: Brain: Appears normal without hemorrhage, infarct, mass lesion, mass effect, midline  shift or abnormal extra-axial fluid collection. No hydrocephalus or pneumocephalus. Vascular: Atherosclerosis noted. Skull: Intact. Sinuses/Orbits: Status post bilateral lens extraction. Otherwise negative. Other: None. IMPRESSION: No acute abnormality. Atherosclerosis. Electronically Signed   By: Inge Rise M.D.   On: 07/19/2016 10:18   Ct Head Wo Contrast  Result Date: 07/14/2016 CLINICAL DATA:  Fall.  Fall from chair.  Neck pain. EXAM: CT HEAD WITHOUT CONTRAST CT CERVICAL SPINE WITHOUT CONTRAST TECHNIQUE: Multidetector CT imaging of the head and cervical spine was performed following the standard protocol without intravenous contrast. Multiplanar CT image reconstructions of the cervical spine were also generated. COMPARISON:  Neck CT 09/01/2015 FINDINGS: CT HEAD FINDINGS Brain: No intracranial hemorrhage. No parenchymal contusion. No midline shift or mass effect. Basilar cisterns are patent. No skull base fracture. No fluid in the paranasal sinuses or mastoid air cells. Orbits are normal. Vascular: No hyperdense vessel or unexpected calcification. Skull: Normal. Negative for fracture or focal lesion. Sinuses/Orbits: Paranasal sinuses and mastoid air cells are clear. Orbits are clear. Other: None. CT CERVICAL SPINE FINDINGS Alignment: Normal alignment Skull base and vertebrae: Normal craniocervical junction. Anterior cervical fusion from C5-C7. No acute loss vertebral body height and disc height. No subluxation. Soft tissues and spinal canal: No epidural paraspinal hematoma. Disc levels:  Unremarkable Upper chest: Clear Other: None IMPRESSION: 1. No intracranial trauma. 2. No acute cervical spine findings. 3. Anterior cervical fusion without complication. Launch template CT CAP with  Electronically Signed   By: Suzy Bouchard M.D.   On: 07/14/2016 20:03   Ct Chest Wo Contrast  Result Date: 06/28/2016 CLINICAL DATA:  Followup pulmonary nodules. EXAM: CT CHEST WITHOUT CONTRAST TECHNIQUE: Multidetector  CT imaging of the chest was performed following the standard protocol without IV contrast. COMPARISON:  Multiple prior chest CTs. The most recent is 04/04/2016 FINDINGS: Chest wall: No breast masses, supraclavicular or axillary lymphadenopathy. The thyroid gland appears normal. Cardiovascular: The heart is normal in size. No pericardial effusion. Minimal scattered atherosclerotic calcifications involving the aorta. Three-vessel coronary artery calcifications appear stable. Mediastinum/Nodes: Small scattered mediastinal and hilar lymph nodes are stable. No mass or overt adenopathy. The esophagus is grossly normal. Lungs/Pleura: Stable mild emphysematous changes and pulmonary scarring. No infiltrates, edema or effusions. No worrisome pulmonary lesions or definite pulmonary nodules. Right apical density has the appearance of scarring change and measures a maximum of 10 mm on image 18. This appears stable. Upper Abdomen: Stable advanced cirrhotic changes involving the liver along with portal venous hypertension, portal venous collaterals and splenomegaly. No obvious hepatic mass and no ascites. Musculoskeletal: No significant bony findings. A spinal cord stimulator is noted in the mid thoracic spine and cervical spine fusion hardware is noted. IMPRESSION: 1. Stable emphysematous changes and pulmonary scarring. No worrisome pulmonary lesions or acute pulmonary findings. 2. Stable small scattered mediastinal and hilar lymph nodes but no mass or adenopathy. 3. Stable coronary artery calcifications. 4. Stable cirrhotic changes involving the liver along with portal venous hypertension, portal venous collaterals and splenomegaly. Electronically Signed   By: Marijo Sanes M.D.   On: 06/28/2016 10:41   Ct Cervical Spine Wo Contrast  Result Date: 07/14/2016 CLINICAL DATA:  Fall.  Fall from chair.  Neck pain. EXAM: CT HEAD WITHOUT CONTRAST CT CERVICAL SPINE WITHOUT CONTRAST TECHNIQUE: Multidetector CT imaging of the head and  cervical spine was performed following the standard protocol without intravenous contrast. Multiplanar CT image reconstructions of the cervical spine were also generated. COMPARISON:  Neck CT 09/01/2015 FINDINGS: CT HEAD FINDINGS Brain: No intracranial hemorrhage. No parenchymal contusion. No midline shift or mass effect. Basilar cisterns are patent. No skull base fracture. No fluid in the paranasal sinuses or mastoid air cells. Orbits are normal. Vascular: No hyperdense vessel or unexpected calcification. Skull: Normal. Negative for fracture or focal lesion. Sinuses/Orbits: Paranasal sinuses and mastoid air cells are clear. Orbits are clear. Other: None. CT CERVICAL SPINE FINDINGS Alignment: Normal alignment Skull base and vertebrae: Normal craniocervical junction. Anterior cervical fusion from C5-C7. No acute loss vertebral body height and disc height. No subluxation. Soft tissues and spinal canal: No epidural paraspinal hematoma. Disc levels:  Unremarkable Upper chest: Clear Other: None IMPRESSION: 1. No intracranial trauma. 2. No acute cervical spine findings. 3. Anterior cervical fusion without complication. Launch template CT CAP with Electronically Signed   By: Suzy Bouchard M.D.   On: 07/14/2016 20:03   Dg Knee Complete 4 Views Right  Result Date: 07/14/2016 CLINICAL DATA:  Right knee pain after fall from chair EXAM: RIGHT KNEE - COMPLETE 4+ VIEW COMPARISON:  MRI of the knee from 05/23/2016 FINDINGS: Small suprapatellar joint effusion. Minor degenerative joint space narrowing of the femorotibial compartment with chondrocalcinosis of hyaline cartilage. Joint space narrowing and spurring also noted of the patellofemoral compartment. No acute displaced fracture. No intra-articular loose bodies. 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 No acute fracture noted. Electronically Signed   By: Ashley Royalty M.D.   On:  07/14/2016 19:50   Dg Hip Unilat W  Or Wo Pelvis 2-3 Views Right  Result Date: 07/14/2016 CLINICAL DATA:  Right hip pain after fall from chair EXAM: DG HIP (WITH OR WITHOUT PELVIS) 2-3V RIGHT COMPARISON:  05/31/2016 FINDINGS: There is no evidence of hip fracture or dislocation. L3-4 bilateral pedicle screw fixation with interbody block noted. Lower lumbar facet arthropathy L4-5 bilaterally. The SI joints and pubic symphysis are maintained. No pelvic fracture. Hip joints are intact with slight joint space narrowing bilaterally. IMPRESSION: No acute radiographic abnormality of the bony pelvis and right hip. L3-4 pedicle screw fixation with interbody block. Electronically Signed   By: Ashley Royalty M.D.   On: 07/14/2016 19:45       Today   Subjective:   Stephanie [name] patient complains of some dry cough but otherwise denies any complaints.   Objective:   Blood pressure (!) 112/52, pulse 77, temperature 97.9 F (36.6 C), temperature source Oral, resp. rate 18, height 5' 2"  (1.575 m), weight 225 lb (102.1 kg), SpO2 94 %.  .  Intake/Output Summary (Last 24 hours) at 07/21/16 1136 Last data filed at 07/21/16 1013  Gross per 24 hour  Intake              180 ml  Output              500 ml  Net             -320 ml    Exam VITAL SIGNS: Blood pressure (!) 112/52, pulse 77, temperature 97.9 F (36.6 C), temperature source Oral, resp. rate 18, height 5' 2"  (1.575 m), weight 225 lb (102.1 kg), SpO2 94 %.  GENERAL:  66 y.o.-year-old patient lying in the bed with no acute distress.  EYES: Pupils equal, round, reactive to light and accommodation. No scleral icterus. Extraocular muscles intact.  HEENT: Head atraumatic, normocephalic. Oropharynx and nasopharynx clear.  NECK:  Supple, no jugular venous distention. No thyroid enlargement, no tenderness.  LUNGS: Normal breath sounds bilaterally, no wheezing, rales,rhonchi or crepitation. No use of accessory muscles of respiration.  CARDIOVASCULAR: S1, S2 normal. No murmurs, rubs, or gallops.   ABDOMEN: Soft, nontender, nondistended. Bowel sounds present. No organomegaly or mass.  EXTREMITIES: No pedal edema, cyanosis, or clubbing.  NEUROLOGIC: Cranial nerves II through XII are intact. Muscle strength 5/5 in all extremities. Sensation intact. Gait not checked.  PSYCHIATRIC: The patient is alert and oriented x 3.  SKIN: No obvious rash, lesion, or ulcer.   Data Review     CBC w Diff: Lab Results  Component Value Date   WBC 4.5 07/20/2016   HGB 11.7 (L) 07/20/2016   HGB 11.7 (L) 09/29/2014   HCT 34.8 (L) 07/20/2016   HCT 34.7 (L) 09/29/2014   PLT 76 (L) 07/20/2016   PLT 102 (L) 09/29/2014   LYMPHOPCT 8 07/19/2016   LYMPHOPCT 18.9 09/29/2014   MONOPCT 9 07/19/2016   MONOPCT 8.6 09/29/2014   EOSPCT 0 07/19/2016   EOSPCT 5.0 09/29/2014   BASOPCT 1 07/19/2016   BASOPCT 1.2 09/29/2014   CMP: Lab Results  Component Value Date   NA 140 07/20/2016   NA 142 09/15/2015   NA 138 05/03/2014   K 3.9 07/20/2016   K 3.9 05/03/2014   CL 106 07/20/2016   CL 103 05/03/2014   CO2 29 07/20/2016   CO2 28 05/03/2014   BUN 12 07/20/2016   BUN 19 09/15/2015   BUN 11 05/03/2014   CREATININE 0.94 07/20/2016  CREATININE 0.73 09/29/2014   GLU 130 09/15/2015   PROT 6.2 (L) 07/20/2016   PROT 7.0 12/26/2013   ALBUMIN 3.0 (L) 07/20/2016   ALBUMIN 2.8 (L) 12/26/2013   BILITOT 0.6 07/20/2016   BILITOT 0.6 12/26/2013   ALKPHOS 78 07/20/2016   ALKPHOS 151 (H) 12/26/2013   AST 25 07/20/2016   AST 50 (H) 12/26/2013   ALT 19 07/20/2016   ALT 41 12/26/2013  .  Micro Results Recent Results (from the past 240 hour(s))  MRSA PCR Screening     Status: None   Collection Time: 07/19/16  4:56 PM  Result Value Ref Range Status   MRSA by PCR NEGATIVE NEGATIVE Final    Comment:        The GeneXpert MRSA Assay (FDA approved for NASAL specimens only), is one component of a comprehensive MRSA colonization surveillance program. It is not intended to diagnose MRSA infection nor to guide  or monitor treatment for MRSA infections.         Code Status Orders        Start     Ordered   07/19/16 1455  Full code  Continuous     07/19/16 1454    Code Status History    Date Active Date Inactive Code Status Order ID Comments User Context   04/18/2016  1:11 PM 04/21/2016  2:38 PM Full Code [id number]  Gladstone Lighter, MD ED   04/04/2016  3:31 PM 04/05/2016  9:38 PM Full Code [id number]  Loletha Grayer, MD ED   09/10/2015  2:04 PM 09/11/2015  6:33 PM Full Code [id number]  Yves Dill, RN Inpatient   09/09/2015 11:14 PM 09/10/2015  2:04 PM DNR [id number]  Demetrios Loll, MD ED            Discharge Medications   Allergies as of 07/21/2016      Reactions   Doxycycline Hives, Swelling, Other (See Comments)   Reaction:  Facial swelling       Medication List    STOP taking these medications   insulin glargine 100 UNIT/ML injection Commonly known as:  LANTUS   metFORMIN 500 MG tablet Commonly known as:  GLUCOPHAGE   pioglitazone 30 MG tablet Commonly known as:  ACTOS     TAKE these medications   acetaminophen 325 MG tablet Commonly known as:  TYLENOL Take 650 mg by mouth every 6 (six) hours as needed.   albuterol 108 (90 Base) MCG/ACT inhaler Commonly known as:  VENTOLIN HFA Inhale 2 puffs into the lungs every 6 (six) hours as needed for wheezing or shortness of breath.   ARIPiprazole 30 MG tablet Commonly known as:  ABILIFY Take 30 mg by mouth daily.   budesonide-formoterol 160-4.5 MCG/ACT inhaler Commonly known as:  SYMBICORT Inhale 2 puffs into the lungs 2 (two) times daily.   buPROPion 150 MG 12 hr tablet Commonly known as:  WELLBUTRIN SR Take 150 mg by mouth daily.   clonazePAM 0.5 MG tablet Commonly known as:  KLONOPIN Take 0.25 mg by mouth 3 (three) times daily.   dicyclomine 10 MG capsule Commonly known as:  BENTYL Take 10 mg by mouth 3 (three) times daily.   ferrous sulfate 325 (65 FE) MG tablet Take 325 mg by mouth 2 (two) times daily.    furosemide 80 MG tablet Commonly known as:  LASIX Take 80 mg by mouth daily.   gabapentin 600 MG tablet Commonly known as:  NEURONTIN Take 600 mg by mouth 2 (two) times daily.  insulin lispro 100 UNIT/ML injection Commonly known as:  HUMALOG Inject 0-9 Units into the skin 3 (three) times daily before meals. Pt uses as needed per sliding scale:    150-200:  0 units  201-250:  5 units  251-300:  8 units 301-350:  9 units  Greater than 350:  Call MD   lactulose 10 GM/15ML solution Commonly known as:  CHRONULAC Take 10 g by mouth 3 (three) times a week.   lamoTRIgine 100 MG tablet Commonly known as:  LAMICTAL Take 100 mg by mouth 2 (two) times daily.   lidocaine 5 % Commonly known as:  LIDODERM Place 1 patch onto the skin daily.   loperamide 2 MG capsule Commonly known as:  IMODIUM Take 4 mg by mouth as needed for diarrhea or loose stools. *Do not exceed 8 doses in 24 hours*   LORazepam 0.5 MG tablet Commonly known as:  ATIVAN Take 1 tablet (0.5 mg total) by mouth daily as needed for anxiety.   metoCLOPramide 5 MG tablet Commonly known as:  REGLAN Take 5 mg by mouth 4 (four) times daily. Before meals and at bedtime   mirtazapine 15 MG tablet Commonly known as:  REMERON Take 15 mg by mouth at bedtime.   multivitamin with minerals Tabs tablet Take 1 tablet by mouth daily with breakfast.   omeprazole 20 MG capsule Commonly known as:  PRILOSEC Take 20 mg by mouth daily before breakfast.   oxybutynin 5 MG 24 hr tablet Commonly known as:  DITROPAN-XL Take 5 mg by mouth daily.   oxyCODONE 5 MG immediate release tablet Commonly known as:  Oxy IR/ROXICODONE Take 1 tablet (5 mg total) by mouth every 12 (twelve) hours.   potassium chloride SA 20 MEQ tablet Commonly known as:  K-DUR,KLOR-CON Take 20 mEq by mouth daily. *Note dose*   pregabalin 150 MG capsule Commonly known as:  LYRICA Take 150 mg by mouth 3 (three) times daily.   rOPINIRole 3 MG tablet Commonly  known as:  REQUIP Take 3 mg by mouth at bedtime.   senna 8.6 MG tablet Commonly known as:  SENOKOT Take 1 tablet by mouth 3 (three) times a week. Take on Monday, Wednesday, and Friday. *Hold for loose stools*   simvastatin 10 MG tablet Commonly known as:  ZOCOR Take 10 mg by mouth at bedtime.   spironolactone 50 MG tablet Commonly known as:  ALDACTONE Take 25 mg by mouth.   traMADol 50 MG tablet Commonly known as:  ULTRAM Take 50 mg by mouth every 12 (twelve) hours as needed for moderate pain.   vitamin C 500 MG tablet Commonly known as:  ASCORBIC ACID Take 500 mg by mouth 2 (two) times daily.   zolpidem 5 MG tablet Commonly known as:  AMBIEN Take 5 mg by mouth at bedtime.          Total Time in preparing paper work, data evaluation and todays exam - 35 minutes  Dustin Flock M.D on 07/21/2016 at 11:37 AM  [facility]   Office  [phone]

## 2016-07-21 NOTE — NC FL2 (Signed)
[place] LEVEL OF CARE SCREENING TOOL     IDENTIFICATION  Patient Name: Stephanie [name] Birthdate: 01-23-1951 Sex: female Admission Date (Current Location): 07/19/2016  [place] and [place] Number:  Stephanie [name] [id number] [place] and Address:  [facility], [address], [place], [place] [address]      Provider Number: [id number]  Attending Physician Name and Address:  Dustin Flock, MD  Relative Name and Phone Number:  Stephanie [name] [phone]  [phone]     Current Level of Care: Hospital Recommended Level of Care: [place] Prior Approval Number:    Date Approved/Denied:   PASRR Number:    Discharge Plan: Other (Comment) ([facility])    Current Diagnoses: Patient Active Problem List   Diagnosis Date Noted  . Encephalopathy 07/19/2016  . Lymphedema 06/14/2016  . Pain in limb 05/06/2016  . Swelling of limb 05/06/2016  . Postphlebitic syndrome with inflammation 05/06/2016  . Pneumonia 04/18/2016  . Chest pain 04/04/2016  . Iron deficiency anemia 01/30/2016  . Hypoglycemia 09/09/2015  . Hyperglycemia 07/16/2015  . Neuropathy ([place]) 06/08/2015  . Thrombocytopenia ([place]) 08/19/2014  . Abdominal pain, chronic, epigastric 06/12/2014  . Liver cirrhosis secondary to NASH ([place]) 02/27/2014  . Dizziness and giddiness 10/30/2013  . DOE (dyspnea on exertion) 10/30/2013  . Poorly controlled type 2 diabetes mellitus with complication ([place]) 03/55/9741  . DNR (do not resuscitate) 07/29/2013  . Encounter for routine gynecological examination 07/29/2013  . Dyspnea 07/10/2013  . Lung nodule 07/10/2013  . Varicose veins of left lower extremity 10/09/2012  . GERD (gastroesophageal reflux disease) 10/09/2012  . Cirrhosis of liver ([place]) 02/02/2012  . Anxiety 07/11/2011  . Aortic dissection ([place])   . BACK PAIN, LUMBAR, CHRONIC 11/19/2009  . UNS ADVRS EFF OTH RX MEDICINAL&BIOLOGICAL SBSTNC 12/16/2008  . UNSPECIFIED  VITAMIN D DEFICIENCY 09/26/2008  . TOBACCO ABUSE 06/19/2008  . RESTLESS LEGS SYNDROME 04/27/2007  . INSOMNIA 04/27/2007  . HLD (hyperlipidemia) 03/08/2007  . OSA (obstructive sleep apnea) 03/08/2007  . Essential hypertension 03/08/2007  . Asthma 03/08/2007  . BIPOLAR AFFECTIVE DISORDER, HX OF 03/08/2007    Orientation RESPIRATION BLADDER Height & Weight     Self, Time, Situation, Place  Normal Continent Weight: 225 lb (102.1 kg) Height:  5' 2"  (157.5 cm)  BEHAVIORAL SYMPTOMS/MOOD NEUROLOGICAL BOWEL NUTRITION STATUS      Continent Diet (Cardiac)  AMBULATORY STATUS COMMUNICATION OF NEEDS Skin   Limited Assist Verbally Normal                       Personal Care Assistance Level of Assistance  Bathing, Feeding, Dressing Bathing Assistance: Limited assistance Feeding assistance: Independent Dressing Assistance: Limited assistance     Functional Limitations Info  Sight, Hearing, Speech Sight Info: Adequate Hearing Info: Adequate Speech Info: Adequate    SPECIAL CARE FACTORS FREQUENCY  PT (By licensed PT)     PT Frequency: Home Health PT Minimum 2x a week              Contractures      Additional Factors Info  Code Status, Allergies, Psychotropic Code Status Info: Full Code Allergies Info: DOXYCYCLINE  Psychotropic Info: ARIPiprazole (ABILIFY) tablet 30 mg and buPROPion (WELLBUTRIN SR) 12 hr tablet 150 mg and          Current Medications (07/21/2016):  This is the current hospital active medication list Current Facility-Administered Medications  Medication Dose Route Frequency Provider Last Rate Last Dose  . acetaminophen (TYLENOL) tablet  650 mg  650 mg Oral Q6H PRN Nicholes Mango, MD   650 mg at 07/20/16 7014   Or  . acetaminophen (TYLENOL) suppository 650 mg  650 mg Rectal Q6H PRN Nicholes Mango, MD      . albuterol (PROVENTIL) (2.5 MG/3ML) 0.083% nebulizer solution 2.5 mg  2.5 mg Nebulization Q6H PRN Dustin Flock, MD      . ARIPiprazole (ABILIFY) tablet 30  mg  30 mg Oral Daily Dustin Flock, MD   30 mg at 07/21/16 0914  . buPROPion (WELLBUTRIN SR) 12 hr tablet 150 mg  150 mg Oral Daily Dustin Flock, MD   150 mg at 07/21/16 0903  . clonazePAM (KLONOPIN) tablet 0.25 mg  0.25 mg Oral TID Dustin Flock, MD   0.25 mg at 07/21/16 0903  . dicyclomine (BENTYL) capsule 10 mg  10 mg Oral TID Dustin Flock, MD   10 mg at 07/21/16 0903  . enoxaparin (LOVENOX) injection 40 mg  40 mg Subcutaneous BID Nicholes Mango, MD   40 mg at 07/21/16 0902  . ferrous sulfate tablet 325 mg  325 mg Oral BID Dustin Flock, MD   325 mg at 07/21/16 0905  . furosemide (LASIX) tablet 80 mg  80 mg Oral Daily Dustin Flock, MD   80 mg at 07/21/16 0905  . gabapentin (NEURONTIN) tablet 600 mg  600 mg Oral BID Dustin Flock, MD   600 mg at 07/21/16 0904  . insulin aspart (novoLOG) injection 0-5 Units  0-5 Units Subcutaneous QHS Aruna Gouru, MD      . insulin aspart (novoLOG) injection 0-9 Units  0-9 Units Subcutaneous TID WC Aruna Gouru, MD      . lactulose (CHRONULAC) 10 GM/15ML solution 10 g  10 g Oral Once per day on Mon Wed Fri Dustin Flock, MD   10 g at 07/20/16 1616  . lamoTRIgine (LAMICTAL) tablet 100 mg  100 mg Oral BID Dustin Flock, MD   100 mg at 07/21/16 0904  . LORazepam (ATIVAN) tablet 0.5 mg  0.5 mg Oral Daily PRN Dustin Flock, MD      . metoCLOPramide (REGLAN) tablet 5 mg  5 mg Oral TID AC & HS Dustin Flock, MD   5 mg at 07/21/16 0904  . mirtazapine (REMERON) tablet 15 mg  15 mg Oral QHS Dustin Flock, MD   15 mg at 07/20/16 2149  . mometasone-formoterol (DULERA) 200-5 MCG/ACT inhaler 2 puff  2 puff Inhalation BID Dustin Flock, MD   2 puff at 07/21/16 0906  . ondansetron (ZOFRAN) tablet 4 mg  4 mg Oral Q6H PRN Nicholes Mango, MD       Or  . ondansetron (ZOFRAN) injection 4 mg  4 mg Intravenous Q6H PRN Nicholes Mango, MD      . oxybutynin (DITROPAN-XL) 24 hr tablet 5 mg  5 mg Oral Daily Dustin Flock, MD   5 mg at 07/21/16 0914  . oxyCODONE (Oxy IR/ROXICODONE)  immediate release tablet 5 mg  5 mg Oral Q12H PRN Lance Coon, MD   5 mg at 07/21/16 1044  . pantoprazole (PROTONIX) EC tablet 40 mg  40 mg Oral Daily Dustin Flock, MD   40 mg at 07/21/16 0904  . pregabalin (LYRICA) capsule 150 mg  150 mg Oral TID Dustin Flock, MD   150 mg at 07/21/16 0903  . rOPINIRole (REQUIP) tablet 3 mg  3 mg Oral QHS Dustin Flock, MD   3 mg at 07/20/16 2149  . simvastatin (ZOCOR) tablet 10 mg  10 mg  Oral QHS Dustin Flock, MD   10 mg at 07/20/16 2147  . sodium chloride flush (NS) 0.9 % injection 3 mL  3 mL Intravenous Q12H Nicholes Mango, MD   3 mL at 07/21/16 0907  . spironolactone (ALDACTONE) tablet 25 mg  25 mg Oral Daily Dustin Flock, MD   25 mg at 07/21/16 0905  . traMADol (ULTRAM) tablet 50 mg  50 mg Oral Q12H PRN Lance Coon, MD   50 mg at 07/21/16 0135     Discharge Medications: Please see discharge summary for a list of discharge medications.  Relevant Imaging Results:  Relevant Lab Results:   Additional Information    Brityn Mastrogiovanni, Jones Broom, LCSWA

## 2016-07-21 NOTE — Progress Notes (Signed)
Patient discharged per MD order and hospital protocol back to the [facility]. Patient was educated on her prescriptions, medications, follow up appointments and discharge instructions. Patient was given an education handout on hypoglycemia. Patient was wheeled off the unit via staff.

## 2016-07-21 NOTE — Care Management Note (Signed)
Case Management Note  Patient Details  Name: Stephanie [name] MRN: [id number] Date of Birth: 1951-02-27   Discharging back [facility].  Obtained order for home health physical therapy.  Informed csw.  Facility uses [facility].  Referral called to [facility]   Post Acute Care Choice:    Choice offered to:     DME Arranged:    DME Agency:     HH Arranged:    HH Agency:     Status of Service:  Completed, signed off  If discussed at [employer] of Stay Meetings, dates discussed:    Additional Comments:  Katrina Stack, RN 07/21/2016, 12:13 PM

## 2016-07-21 NOTE — Telephone Encounter (Signed)
Called patient to inform her that the surgeon and Dr. Mike Gip spoke on the phone and the surgeon will draw the labs preop that he requires. Patient voiced understanding.

## 2016-07-26 DIAGNOSIS — E119 Type 2 diabetes mellitus without complications: Secondary | ICD-10-CM | POA: Diagnosis not present

## 2016-07-26 DIAGNOSIS — I10 Essential (primary) hypertension: Secondary | ICD-10-CM | POA: Diagnosis not present

## 2016-07-26 DIAGNOSIS — R54 Age-related physical debility: Secondary | ICD-10-CM | POA: Diagnosis not present

## 2016-07-28 ENCOUNTER — Emergency Department
Admission: EM | Admit: 2016-07-28 | Discharge: 2016-07-28 | Disposition: A | Discharge status: Home / Self Care | Payer: Medicare Other | Attending: Emergency Medicine | Admitting: Emergency Medicine

## 2016-07-28 ENCOUNTER — Emergency Department: Payer: Medicare Other

## 2016-07-28 ENCOUNTER — Encounter
Admission: RE | Admit: 2016-07-28 | Discharge: 2016-07-28 | Disposition: A | Discharge status: Home / Self Care | Payer: Medicare Other | Source: Ambulatory Visit | Attending: Surgery | Admitting: Surgery

## 2016-07-28 ENCOUNTER — Other Ambulatory Visit: Payer: Self-pay

## 2016-07-28 DIAGNOSIS — J45901 Unspecified asthma with (acute) exacerbation: Secondary | ICD-10-CM | POA: Diagnosis not present

## 2016-07-28 DIAGNOSIS — I10 Essential (primary) hypertension: Secondary | ICD-10-CM | POA: Insufficient documentation

## 2016-07-28 DIAGNOSIS — Z7901 Long term (current) use of anticoagulants: Secondary | ICD-10-CM | POA: Diagnosis not present

## 2016-07-28 DIAGNOSIS — M1711 Unilateral primary osteoarthritis, right knee: Secondary | ICD-10-CM | POA: Diagnosis not present

## 2016-07-28 DIAGNOSIS — Z881 Allergy status to other antibiotic agents status: Secondary | ICD-10-CM | POA: Diagnosis not present

## 2016-07-28 DIAGNOSIS — Z87891 Personal history of nicotine dependence: Secondary | ICD-10-CM | POA: Diagnosis not present

## 2016-07-28 DIAGNOSIS — Z794 Long term (current) use of insulin: Secondary | ICD-10-CM | POA: Insufficient documentation

## 2016-07-28 DIAGNOSIS — M23203 Derangement of unspecified medial meniscus due to old tear or injury, right knee: Secondary | ICD-10-CM | POA: Insufficient documentation

## 2016-07-28 DIAGNOSIS — Z01812 Encounter for preprocedural laboratory examination: Secondary | ICD-10-CM | POA: Diagnosis not present

## 2016-07-28 DIAGNOSIS — Z01818 Encounter for other preprocedural examination: Secondary | ICD-10-CM | POA: Insufficient documentation

## 2016-07-28 DIAGNOSIS — R0602 Shortness of breath: Secondary | ICD-10-CM | POA: Diagnosis not present

## 2016-07-28 DIAGNOSIS — Z79899 Other long term (current) drug therapy: Secondary | ICD-10-CM | POA: Insufficient documentation

## 2016-07-28 DIAGNOSIS — R05 Cough: Secondary | ICD-10-CM | POA: Diagnosis not present

## 2016-07-28 DIAGNOSIS — E119 Type 2 diabetes mellitus without complications: Secondary | ICD-10-CM | POA: Insufficient documentation

## 2016-07-28 DIAGNOSIS — J069 Acute upper respiratory infection, unspecified: Secondary | ICD-10-CM

## 2016-07-28 HISTORY — DX: Localized edema: R60.0

## 2016-07-28 LAB — BASIC METABOLIC PANEL
Anion gap: 4 — ABNORMAL LOW (ref 5–15)
BUN: 17 mg/dL (ref 6–20)
CALCIUM: 9.2 mg/dL (ref 8.9–10.3)
CO2: 28 mmol/L (ref 22–32)
CREATININE: 0.84 mg/dL (ref 0.44–1.00)
Chloride: 108 mmol/L (ref 101–111)
GFR calc non Af Amer: >60 mL/min (ref >60)
Glucose, Bld: 84 mg/dL (ref 65–99)
Potassium: 4 mmol/L (ref 3.5–5.1)
SODIUM: 140 mmol/L (ref 135–145)

## 2016-07-28 LAB — CBC
HCT: 35.4 % (ref 35.0–47.0)
Hemoglobin: 12.1 g/dL (ref 12.0–16.0)
MCH: 31.7 pg (ref 26.0–34.0)
MCHC: 34.1 g/dL (ref 32.0–36.0)
MCV: 92.9 fL (ref 80.0–100.0)
Platelets: 88 10*3/uL — ABNORMAL LOW (ref 150–440)
RBC: 3.81 MIL/uL (ref 3.80–5.20)
RDW: 16.4 % — AB (ref 11.5–14.5)
WBC: 5.5 10*3/uL (ref 3.6–11.0)

## 2016-07-28 LAB — SURGICAL PCR SCREEN
MRSA, PCR: POSITIVE — AB
STAPHYLOCOCCUS AUREUS: POSITIVE — AB

## 2016-07-28 LAB — INFLUENZA PANEL BY PCR (TYPE A & B)
INFLBPCR: NEGATIVE
Influenza A By PCR: NEGATIVE

## 2016-07-28 LAB — HEPATIC FUNCTION PANEL
ALBUMIN: 3.5 g/dL (ref 3.5–5.0)
ALK PHOS: 130 U/L — AB (ref 38–126)
ALT: 29 U/L (ref 14–54)
AST: 39 U/L (ref 15–41)
Bilirubin, Direct: 0.1 mg/dL (ref 0.1–0.5)
Indirect Bilirubin: 0.8 mg/dL (ref 0.3–0.9)
TOTAL PROTEIN: 7 g/dL (ref 6.5–8.1)
Total Bilirubin: 0.9 mg/dL (ref 0.3–1.2)

## 2016-07-28 LAB — BRAIN NATRIURETIC PEPTIDE: B Natriuretic Peptide: 108 pg/mL — ABNORMAL HIGH (ref 0.0–100.0)

## 2016-07-28 MED ORDER — PREDNISONE 20 MG PO TABS: 60.0000 mg | ORAL_TABLET | Freq: Every day | ORAL | 0 refills | Status: AC

## 2016-07-28 MED ORDER — METHYLPREDNISOLONE SODIUM SUCC 125 MG IJ SOLR
125.0000 mg | Freq: Once | INTRAMUSCULAR | Status: AC
  Administered 2016-07-28: 125 mg via INTRAVENOUS
  Filled 2016-07-28: qty 2

## 2016-07-28 MED ORDER — ACETAMINOPHEN 500 MG PO TABS
1000.0000 mg | ORAL_TABLET | Freq: Once | ORAL | Status: AC
  Administered 2016-07-28: 1000 mg via ORAL
  Filled 2016-07-28: qty 2

## 2016-07-28 MED ORDER — IPRATROPIUM-ALBUTEROL 0.5-2.5 (3) MG/3ML IN SOLN
3.0000 mL | Freq: Once | RESPIRATORY_TRACT | Status: AC
  Administered 2016-07-28: 3 mL via RESPIRATORY_TRACT
  Filled 2016-07-28: qty 3

## 2016-07-28 MED ORDER — ALBUTEROL SULFATE HFA 108 (90 BASE) MCG/ACT IN AERS: 2.0000 | INHALATION_SPRAY | Freq: Four times a day (QID) | RESPIRATORY_TRACT | 2 refills | Status: DC | PRN

## 2016-07-28 NOTE — ED Triage Notes (Signed)
Pt arrives here from [facility] with reports of increased shortness of breath over the last two days  Alert and speaking in full sentences during triage

## 2016-07-28 NOTE — Pre-Procedure Instructions (Signed)
TIFFANY AT DR [facility] NOTIFIED PATIENT TO ED IN W/C BY K DICKSON,RN. INFO CALLED TO [facility]

## 2016-07-28 NOTE — Patient Instructions (Signed)
  Your procedure is scheduled on: 08/04/16 Thurs Report to Same Day Surgery 2nd floor medical mall [facility] Entrance-take elevator on left to 2nd floor.  Check in with surgery information desk.) To find out your arrival time please call [phone] between 1PM - 3PM on 08/03/16 Wed  Remember: Instructions that are not followed completely may result in serious medical risk, up to and including death, or upon the discretion of your surgeon and anesthesiologist your surgery may need to be rescheduled.    _x___ 1. Do not eat food or drink liquids after midnight. No gum chewing or hard candies.     __x__ 2. No Alcohol for 24 hours before or after surgery.   __x__3. No Smoking for 24 prior to surgery.   ____  4. Bring all medications with you on the day of surgery if instructed.    __x__ 5. Notify your doctor if there is any change in your medical condition     (cold, fever, infections).     Do not wear jewelry, make-up, hairpins, clips or nail polish.  Do not wear lotions, powders, or perfumes. You may wear deodorant.  Do not shave 48 hours prior to surgery. Men may shave face and neck.  Do not bring valuables to the hospital.    [facility] is not responsible for any belongings or valuables.               Contacts, dentures or bridgework may not be worn into surgery.  Leave your suitcase in the car. After surgery it may be brought to your room.  For patients admitted to the hospital, discharge time is determined by your treatment team.   Patients discharged the day of surgery will not be allowed to drive home.  You will need someone to drive you home and stay with you the night of your procedure.    Please read over the following fact sheets that you were given:   [facility] [address] Preparing for Surgery and or MRSA Information   _x___ Take these medicines the morning of surgery with A SIP OF WATER:    1. albuterol (VENTOLIN HFA  2.ARIPiprazole (ABILIFY  3.budesonide-formoterol  (SYMBICORT  4.clonazePAM (KLONOPIN  5.gabapentin (NEURONTIN  6.LORazepam (ATIVAN  7.pregabalin (LYRICA  8.pregabalin (LYRICA ____Fleets enema or Magnesium Citrate as directed.   _x___ Use CHG Soap or sage wipes as directed on instruction sheet   ____ Use inhalers on the day of surgery and bring to hospital day of surgery  ____ Stop metformin 2 days prior to surgery    ____ Take 1/2 of usual insulin dose the night before surgery and none on the morning of           surgery.   ____ Stop Aspirin, Coumadin, Pllavix ,Eliquis, Effient, or Pradaxa  x__ Stop Anti-inflammatories such as Advil, Aleve, Ibuprofen, Motrin, Naproxen,          Naprosyn, Goodies powders or aspirin products. Ok to take Tylenol.   ____ Stop supplements until after surgery.    ____ Bring C-Pap to the hospital.

## 2016-07-28 NOTE — Discharge Instructions (Signed)
You were seen in the Emergency Department (ED) today and was diagnosed with an asthma exacerbation.   During an asthma attack, the airways swell and narrow. This makes it hard to breathe. Severe asthma attacks can be life-threatening, but you can help prevent them by keeping your asthma under control and treating symptoms before they get bad. This can also help you avoid future trips to the emergency room.   Follow-up with your doctor in 1 day for further evaluation.  Use albuterol 2 puffs every 4 hours as needed for shortness of breath, wheezing, or cough. Take steroids as prescribed.   When should you call for help?  Call 911 anytime you think you may need emergency care. For example, call if:  You have severe trouble breathing. Call your doctor now or seek immediate medical care if:  Your symptoms do not get better after you have followed your asthma action plan.  You have new or worse trouble breathing.  Your coughing and wheezing get worse.  You cough up dark brown or bloody mucus (sputum).  You have a new or higher fever. Watch closely for changes in your health, and be sure to contact your doctor if:  You need to use quick-relief medicine on more than 2 days a week (unless it is just for exercise).  You cough more deeply or more often, especially if you notice more mucus or a change in the color of your mucus.  You are not getting better as expected.  How can you care for yourself at home?  Follow your asthma action plan to prevent and treat attacks. If you don't have an asthma action plan, work with your doctor to create one.  Take your asthma medicines exactly as prescribed. Talk to your doctor right away if you have any questions about how to take them.  Use your quick-relief medicine when you have symptoms of an attack. Quick-relief medicine is usually an albuterol inhaler. Some people need to use quick-relief medicine before they exercise.  Take your controller medicine every  day, not just when you have symptoms. Controller medicine is usually an inhaled corticosteroid. The goal is to prevent problems before they occur. Don't use your controller medicine to treat an attack that has already started. It doesn't work fast enough to help.  If your doctor prescribed corticosteroid pills to use during an attack, take them exactly as prescribed. It may take hours for the pills to work, but they may make the episode shorter and help you breathe better.  Keep your quick-relief medicine with you at all times. Talk to your doctor before using other medicines. Some medicines, such as aspirin, can cause asthma attacks in some people.  Learn how to use a peak flow meter to check how well you are breathing. This can help you predict when an asthma attack is going to occur. Then you can take medicine to prevent the asthma attack or make it less severe.  Do not smoke or allow others to smoke around you. Avoid smoky places. Smoking makes asthma worse. If you need help quitting, talk to your doctor about stop-smoking programs and medicines. These can increase your chances of quitting for good.  Learn what triggers an asthma attack for you, and avoid the triggers when you can. Common triggers include colds, smoke, air pollution, dust, pollen, mold, pets, cockroaches, stress, and cold air.  Avoid colds and the flu. Get a pneumococcal vaccine shot. If you have had one before, ask your doctor if  you need a second dose. Get a flu vaccine every fall. If you must be around people with colds or the flu, wash your hands often.  To control asthma over the long term   Medicines  Controller medicines reduce swelling in your lungs. They also prevent asthma attacks. Take your controller medicine exactly as prescribed. Talk to your doctor if you have any problems with your medicine.  Inhaled corticosteroid is a common and effective controller medicine. Using it the right way can prevent or reduce most side  effects.  Take your controller medicine every day, not just when you have symptoms. It helps prevent problems before they occur.  Your doctor may prescribe another medicine that you use along with the corticosteroid. This is often a long-acting bronchodilator. Do not take this medicine by itself. Using a long-acting bronchodilator by itself can increase your risk of a severe or fatal asthma attack.  Do not take inhaled corticosteroids or long-acting bronchodilators to stop an asthma attack that has already started. They don't work fast enough to help.  Talk to your doctor before you use other medicines. Some medicines, such as aspirin, can cause asthma attacks in some people.  Education  Learn what triggers an asthma attack. Avoid these triggers when you can. Common triggers include colds, smoke, air pollution, dust, pollen, mold, pets, cockroaches, stress, and cold air.  If you have a peak flow meter, use it to check how well you are breathing. It can help you know when an asthma attack is going to occur. Then you can take medicine to prevent the asthma attack or make it less severe.  Do not smoke or allow others to smoke around you. Avoid smoky places. Smoking makes asthma worse. If you need help quitting, talk to your doctor about stop-smoking programs and medicines. These can increase your chances of quitting for good.  Avoid colds and the flu. Get a pneumococcal vaccine shot. If you have had one before, ask your doctor whether you need a second dose. Get a flu vaccine every year, as soon as it's available. If you must be around people with colds or the flu, wash your hands often.

## 2016-07-28 NOTE — ED Provider Notes (Signed)
[facility] Emergency Department Provider Note  ____________________________________________  Time seen: Approximately 11:12 AM  I have reviewed the triage vital signs and the nursing notes.   HISTORY  Chief Complaint Shortness of Breath   HPI Stephanie [name] is a 66 y.o. female with a history of asthma, cirrhosis, OSA on CPAP who presents for evaluation of shortness of breath and chest pain.Patient reports that for the last 3 days she has had a dry cough, congestion, chills, wheezing, and intermittent chest pain. She has been using her inhalers with improvement of her pain. She describes the pain as sharp, intermittent, lasting minutes to hours at a time, resolving with no intervention. Currently endorses 9 out of 10 pain. Pain is not pleuritic in nature. SOB is worse with minimal exertion.  No prior history of PE or DVT, no leg pain or swelling, no hemoptysis. Patient denies fever but endorses chills, has had nausea and decreased appetite. She denies abdominal pain, vomiting, diarrhea, dysuria. No paresthesias of her extremities, no pain radiating to her back, no dizziness.  Past Medical History:  Diagnosis Date  . Adenomatous colon polyp 08/27/2014   Polyps x 3  . Anemia   . Anxiety   . 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 Arthritis   . Asthma   . Bipolar affective disorder ([place])    h/o  . Blood transfusion 2000  . Blood transfusion without reported diagnosis   . Chronic abdominal pain    abdominal wall pain  . 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 Colon polyp   . Depression   . Diabetes mellitus without complication ([place])   . Fatty liver 04/09/08   found in abd CT  . GERD (gastroesophageal reflux disease)   . H/O: CVA (cardiovascular accident)    TIA  . H/O: rheumatic fever   . Hyperlipidemia   . Incontinence   . Insomnia   . Lower extremity edema   . Obstructive sleep apnea    not using CPAP, last sleep study 2.5 years ago, per pt  doctor is aware  . Platelets decreased ([place])   . RLS (restless legs syndrome)   . Shortness of breath   . Sleep apnea   . Stroke [facility])    TIA- 2002  . Ulcer ([place])   . Unspecified disorders of nervous system     Patient Active Problem List   Diagnosis Date Noted  . Encephalopathy 07/19/2016  . Lymphedema 06/14/2016  . Pain in limb 05/06/2016  . Swelling of limb 05/06/2016  . Postphlebitic syndrome with inflammation 05/06/2016  . Pneumonia 04/18/2016  . Chest pain 04/04/2016  . Iron deficiency anemia 01/30/2016  . Hypoglycemia 09/09/2015  . Hyperglycemia 07/16/2015  . Neuropathy ([place]) 06/08/2015  . Thrombocytopenia ([place]) 08/19/2014  . Abdominal pain, chronic, epigastric 06/12/2014  . Liver cirrhosis secondary to NASH ([place]) 02/27/2014  . Dizziness and giddiness 10/30/2013  . DOE (dyspnea on exertion) 10/30/2013  . Poorly controlled type 2 diabetes mellitus with complication ([place]) 07/62/2633  . DNR (do not resuscitate) 07/29/2013  . Encounter for routine gynecological examination 07/29/2013  . Dyspnea 07/10/2013  . Lung nodule 07/10/2013  . Varicose veins of left lower extremity 10/09/2012  . GERD (gastroesophageal reflux disease) 10/09/2012  . Cirrhosis of liver ([place]) 02/02/2012  . Anxiety 07/11/2011  . Aortic dissection ([place])   . BACK PAIN, LUMBAR, CHRONIC 11/19/2009  . UNS ADVRS EFF OTH RX MEDICINAL&BIOLOGICAL SBSTNC 12/16/2008  . UNSPECIFIED  VITAMIN D DEFICIENCY 09/26/2008  . TOBACCO ABUSE 06/19/2008  . RESTLESS LEGS SYNDROME 04/27/2007  . INSOMNIA 04/27/2007  . HLD (hyperlipidemia) 03/08/2007  . OSA (obstructive sleep apnea) 03/08/2007  . Essential hypertension 03/08/2007  . Asthma 03/08/2007  . BIPOLAR AFFECTIVE DISORDER, HX OF 03/08/2007    Past Surgical History:  Procedure Laterality Date  . ABDOMINAL EXPLORATION SURGERY    . ABDOMINAL HYSTERECTOMY     total  . Axillary artery cannulation via 8-mm Hemashield graft, median sternotomy, extracorporeal  circulation with deep hypothermic circulatory arrest, repair of  aortic dessection  01/23/2009   Dr Arlyce Dice  . BACK SURGERY     x 45  . cataract     both eyes  . CHOLECYSTECTOMY    . COLONOSCOPY    . EYE SURGERY     bilat  . HEMORROIDECTOMY     and colon polyp removed  . LIVER BIOPSY  04/10/2012   Procedure: LIVER BIOPSY;  Surgeon: Inda Castle, MD;  Location: [facility];  Service: Endoscopy;  Laterality: N/A;  ultrasound to mark order for abd limited/liver to be marked to be sent by [email or web address]   . LUMBAR FUSION  10/09  . LUMBAR WOUND DEBRIDEMENT  05/09/2011   Procedure: LUMBAR WOUND DEBRIDEMENT;  Surgeon: Dahlia Bailiff;  Location: [place];  Service: Orthopedics;  Laterality: N/A;  IRRIGATION AND DEBRIDEMENT SPINAL WOUND  . POSTERIOR CERVICAL FUSION/FORAMINOTOMY    . ROTATOR CUFF REPAIR     left    Prior to Admission medications   Medication Sig Start Date End Date Taking? Authorizing Provider  acetaminophen (TYLENOL) 325 MG tablet Take 650 mg by mouth every 6 (six) hours as needed for moderate pain or fever.    Yes Historical Provider, MD  acetaminophen (TYLENOL) 650 MG suppository Place 650 mg rectally every 6 (six) hours as needed for moderate pain or fever.   Yes Historical Provider, MD  albuterol (PROVENTIL) (2.5 MG/3ML) 0.083% nebulizer solution Take 2.5 mg by nebulization every 6 (six) hours as needed for wheezing or shortness of breath.   Yes Historical Provider, MD  albuterol (VENTOLIN HFA) 108 (90 Base) MCG/ACT inhaler Inhale 2 puffs into the lungs every 6 (six) hours as needed for wheezing or shortness of breath. 12/24/15  Yes Chesley Mires, MD  ARIPiprazole (ABILIFY) 30 MG tablet Take 30 mg by mouth daily.   Yes Historical Provider, MD  budesonide-formoterol (SYMBICORT) 160-4.5 MCG/ACT inhaler Inhale 2 puffs into the lungs 2 (two) times daily.   Yes Historical Provider, MD  buPROPion (WELLBUTRIN SR) 150 MG 12 hr tablet Take 150 mg by mouth daily.   Yes Historical Provider,  MD  clonazePAM (KLONOPIN) 0.5 MG tablet Take 0.25 mg by mouth 3 (three) times daily.    Yes Historical Provider, MD  dicyclomine (BENTYL) 10 MG capsule Take 10 mg by mouth 3 (three) times daily.    Yes Historical Provider, MD  Emollient (EUCERIN INTENSIVE REPAIR) LOTN Apply 1 application topically 2 (two) times daily.   Yes Historical Provider, MD  enoxaparin (LOVENOX) 40 MG/0.4ML injection Inject 40 mg into the skin every 12 (twelve) hours.   Yes Historical Provider, MD  ferrous sulfate 325 (65 FE) MG tablet Take 325 mg by mouth 2 (two) times daily.    Yes Historical Provider, MD  furosemide (LASIX) 80 MG tablet Take 80 mg by mouth daily.   Yes Historical Provider, MD  insulin lispro (HUMALOG) 100 UNIT/ML injection Inject 0-9 Units into the skin 3 (three) times daily before meals.  Pt uses as needed per sliding scale:    150-200:  0 units  201-250:  5 units  251-300:  8 units 301-350:  9 units  Greater than 350:  Call MD   Yes Historical Provider, MD  lactulose (CHRONULAC) 10 GM/15ML solution Take 10 g by mouth 3 (three) times a week.    Yes Historical Provider, MD  lamoTRIgine (LAMICTAL) 100 MG tablet Take 100 mg by mouth 2 (two) times daily.   Yes Historical Provider, MD  LANTUS 100 UNIT/ML injection Inject 20 Units into the skin at bedtime. 07/20/16  Yes Historical Provider, MD  lidocaine (LIDODERM) 5 % Place 1 patch onto the skin daily. 05/31/16  Yes Daymon Larsen, MD  loperamide (IMODIUM) 2 MG capsule Take 4 mg by mouth as needed for diarrhea or loose stools. *Do not exceed 8 doses in 24 hours*   Yes Historical Provider, MD  LORazepam (ATIVAN) 0.5 MG tablet Take 1 tablet (0.5 mg total) by mouth daily as needed for anxiety. 07/21/16  Yes Dustin Flock, MD  metoCLOPramide (REGLAN) 5 MG tablet Take 5 mg by mouth 4 (four) times daily. Before meals and at bedtime   Yes Historical Provider, MD  mirtazapine (REMERON) 15 MG tablet Take 15 mg by mouth at bedtime.   Yes Historical Provider, MD    Multiple Vitamins-Iron (MULTIVITAMINS WITH IRON) TABS tablet Take 1 tablet by mouth daily with breakfast.   Yes Historical Provider, MD  omeprazole (PRILOSEC) 20 MG capsule Take 20 mg by mouth daily before breakfast.    Yes Historical Provider, MD  ondansetron (ZOFRAN) 4 MG tablet Take 4 mg by mouth every 6 (six) hours as needed for nausea or vomiting.   Yes Historical Provider, MD  oxybutynin (DITROPAN-XL) 5 MG 24 hr tablet Take 5 mg by mouth daily.   Yes Historical Provider, MD  oxyCODONE (OXY IR/ROXICODONE) 5 MG immediate release tablet Take 1 tablet (5 mg total) by mouth every 12 (twelve) hours. 07/21/16  Yes Dustin Flock, MD  potassium chloride SA (K-DUR,KLOR-CON) 20 MEQ tablet Take 20 mEq by mouth daily. *Note dose*   Yes Historical Provider, MD  pregabalin (LYRICA) 150 MG capsule Take 150 mg by mouth 3 (three) times daily.    Yes Historical Provider, MD  rOPINIRole (REQUIP) 3 MG tablet Take 3 mg by mouth at bedtime.   Yes Historical Provider, MD  simvastatin (ZOCOR) 10 MG tablet Take 10 mg by mouth at bedtime.    Yes Historical Provider, MD  spironolactone (ALDACTONE) 25 MG tablet Take 25 mg by mouth daily.   Yes Historical Provider, MD  traMADol (ULTRAM) 50 MG tablet Take 50 mg by mouth 2 (two) times daily as needed for moderate pain.    Yes Historical Provider, MD  vitamin C (ASCORBIC ACID) 500 MG tablet Take 500 mg by mouth 2 (two) times daily.   Yes Historical Provider, MD  zolpidem (AMBIEN) 5 MG tablet Take 5 mg by mouth at bedtime.    Yes Historical Provider, MD  albuterol (PROVENTIL HFA;VENTOLIN HFA) 108 (90 Base) MCG/ACT inhaler Inhale 2 puffs into the lungs every 6 (six) hours as needed for wheezing or shortness of breath. 07/28/16   Rudene Re, MD  predniSONE (DELTASONE) 20 MG tablet Take 3 tablets (60 mg total) by mouth daily. 07/28/16 08/01/16  Rudene Re, MD    Allergies Morphine and related and Doxycycline  Family History  Problem Relation Age of Onset  .  Breast cancer Mother   . Lung cancer Mother   .  Stomach cancer Father   . Diabetes      4 aunts, and 1 uncle  . Colon cancer Neg Hx   . Esophageal cancer Neg Hx   . Rectal cancer Neg Hx     Social History Social History  Substance Use Topics  . Smoking status: Former Smoker    Packs/day: 1.50    Years: 50.00    Types: Cigarettes    Quit date: 04/06/2013  . Smokeless tobacco: Never Used  . Alcohol use No    Review of Systems  Constitutional: Negative for fever. + chills Eyes: Negative for visual changes. ENT: Negative for sore throat. Neck: No neck pain  Cardiovascular: Negative for chest pain. Respiratory: + shortness of breath, cough, congestion, wheezing Gastrointestinal: Negative for abdominal pain, vomiting or diarrhea. + nausea Genitourinary: Negative for dysuria. Musculoskeletal: Negative for back pain. Skin: Negative for rash. Neurological: Negative for headaches, weakness or numbness. Psych: No SI or HI  ____________________________________________   PHYSICAL EXAM:  VITAL SIGNS: ED Triage Vitals  Enc Vitals Group     BP 07/28/16 1020 (!) 129/50     Pulse Rate 07/28/16 1020 69     Resp 07/28/16 1020 18     Temp 07/28/16 1020 97.9 F (36.6 C)     Temp Source 07/28/16 1020 Oral     SpO2 07/28/16 1020 97 %     Weight 07/28/16 1020 217 lb (98.4 kg)     Height 07/28/16 1020 5' 2"  (1.575 m)     Head Circumference --      Peak Flow --      Pain Score 07/28/16 1021 7     Pain Loc --      Pain Edu? --      Excl. in [place]? --     Constitutional: Alert and oriented. Well appearing and in no apparent distress. HEENT:      Head: Normocephalic and atraumatic.         Eyes: Conjunctivae are normal. Sclera is non-icteric. EOMI. PERRL      Mouth/Throat: Mucous membranes are moist.       Neck: Supple with no signs of meningismus. Cardiovascular: Regular rate and rhythm. No murmurs, gallops, or rubs. 2+ symmetrical distal pulses are present in all extremities. No  JVD. Respiratory: Normal respiratory effort. Lungs are clear to auscultation bilaterally, with decreased air movement and faint diffuse expiratory wheezes Gastrointestinal: Soft, non tender, and non distended with positive bowel sounds. No rebound or guarding. Genitourinary: No CVA tenderness. Musculoskeletal: Nontender with normal range of motion in all extremities. No edema, cyanosis, or erythema of extremities. Neurologic: Normal speech and language. Face is symmetric. Moving all extremities. No gross focal neurologic deficits are appreciated. Skin: Skin is warm, dry and intact. No rash noted. Psychiatric: Mood and affect are normal. Speech and behavior are normal.  ____________________________________________   LABS (all labs ordered are listed, but only abnormal results are displayed)  Labs Reviewed  BASIC METABOLIC PANEL - Abnormal; Notable for the following:       Result Value   Anion gap 4 (*)    All other components within normal limits  CBC - Abnormal; Notable for the following:    RDW 16.4 (*)    Platelets 88 (*)    All other components within normal limits  HEPATIC FUNCTION PANEL - Abnormal; Notable for the following:    Alkaline Phosphatase 130 (*)    All other components within normal limits  BRAIN NATRIURETIC PEPTIDE - Abnormal; Notable  for the following:    B Natriuretic Peptide 108.0 (*)    All other components within normal limits  INFLUENZA PANEL BY PCR (TYPE A & B)   ____________________________________________  EKG  ED ECG REPORT I, Rudene Re, the attending physician, personally viewed and interpreted this ECG.  Normal sinus rhythm, rate of 70, normal intervals, normal axis, low voltage QRS, no ST elevations or depressions. Unchanged from prior.  ____________________________________________  RADIOLOGY  CXR: Stable cardiomegaly  ____________________________________________   PROCEDURES  Procedure(s) performed: None Procedures Critical  Care performed:  None ____________________________________________   INITIAL IMPRESSION / ASSESSMENT AND PLAN / ED COURSE  66 y.o. female with a history of asthma, cirrhosis, OSA on CPAP who presents for evaluation of 3 days of worsening shortness of breath. Dry cough, congestion, malaise, wheezing, nausea, and intermittent chest pain. Patient is well-appearing, in no distress as normal vital signs, EKG with no evidence of ischemia. She has diffuse expiratory wheezing and decreased air movement concerning for an asthma exacerbation. We'll give duoneb 3 and start patient on prednisone. Chest x-ray with no evidence of pulmonary edema or pneumonia. Will check for the flu. Will cycle troponin. No clinical suspicion for dissection or PE at this time with normal vital signs, normal neurological exam, no pleuritic chest pain, no pain radiating to her back, normal mediastinum on chest x-ray, no tachypnea, no tachycardia, no oxygen requirement.  Clinical Course as of Jul 28 1413  Thu Jul 28, 2016  1412 Influenza negative. Chest x-ray witha pulmonary edema or pneumonia. Patient is now moving great air with no wheezing or crackles, remains with stable vital signs and afebrile. Blood work with no acute findings. CP resolved after duoneb treatment. She will be discharged home on albuterol, prednisone, and close follow-up with PCP.  [CV]    Clinical Course User Index [CV] Rudene Re, MD    Pertinent labs & imaging results that were available during my care of the patient were reviewed by me and considered in my medical decision making (see chart for details).    ____________________________________________   FINAL CLINICAL IMPRESSION(S) / ED DIAGNOSES  Final diagnoses:  Exacerbation of asthma, unspecified asthma severity, unspecified whether persistent  Viral URI      NEW MEDICATIONS STARTED DURING THIS VISIT:  New Prescriptions   ALBUTEROL (PROVENTIL HFA;VENTOLIN HFA) 108 (90 BASE)  MCG/ACT INHALER    Inhale 2 puffs into the lungs every 6 (six) hours as needed for wheezing or shortness of breath.   PREDNISONE (DELTASONE) 20 MG TABLET    Take 3 tablets (60 mg total) by mouth daily.     Note:  This document was prepared using Dragon voice recognition software and may include unintentional dictation errors.    Rudene Re, MD 07/28/16 1415

## 2016-07-28 NOTE — Pre-Procedure Instructions (Signed)
Congested cough, wheezing, with rales bilateral.  Difficulty holding eyes open.  Called ED and transported to ED via wheelchair to be evaluated for respiratory issues.

## 2016-07-28 NOTE — ED Notes (Signed)
Pt had ambulated to the toilet - hooked back up to monitor

## 2016-07-28 NOTE — Pre-Procedure Instructions (Signed)
Rales bilateral 1/2, SOB with minimal exertion.  Sent to ER for evaluation.

## 2016-07-29 IMAGING — CT CT ANGIO CHEST
2 of 6 series · 16 of 36 positions shown · IV contrast (isovue)
Comparison: CT abdomen and pelvis from 09/01/2015.

CLINICAL DATA: Chest pain with worsening shortness of breath.

EXAM:
CT ANGIOGRAPHY CHEST, ABDOMEN AND PELVIS
TECHNIQUE: Multidetector CT imaging through the chest, abdomen and pelvis was
performed using the standard protocol during bolus administration of
intravenous contrast. Multiplanar reconstructed images and MIPs were
obtained and reviewed to evaluate the vascular anatomy.
CONTRAST:  125 cc Isovue 370.

[Series 6: arterial · axial · arterial · 0.79mm/px · z∈[-845,-257]mm · 15 of 323 slices shown]
[im 15/323  lung]
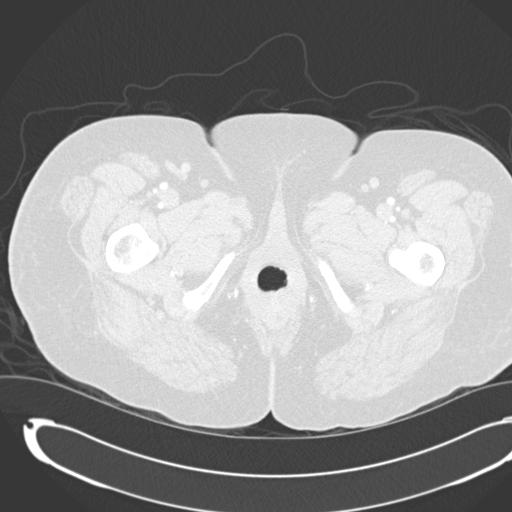
[im 43/323  mediastinal]
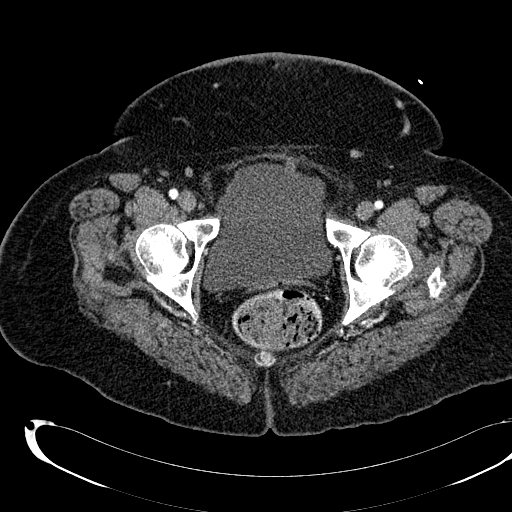
[im 57/323  lung]
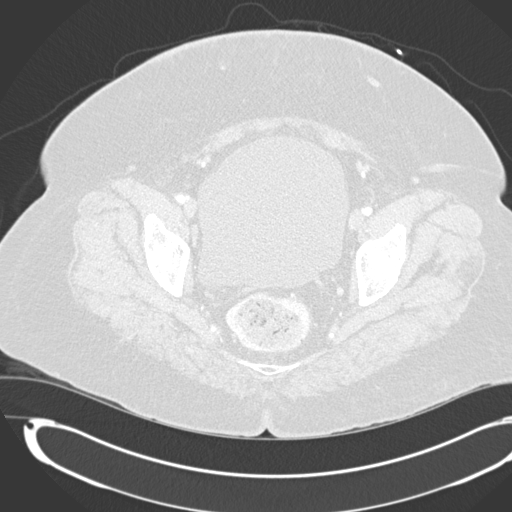
[im 85/323  mediastinal]
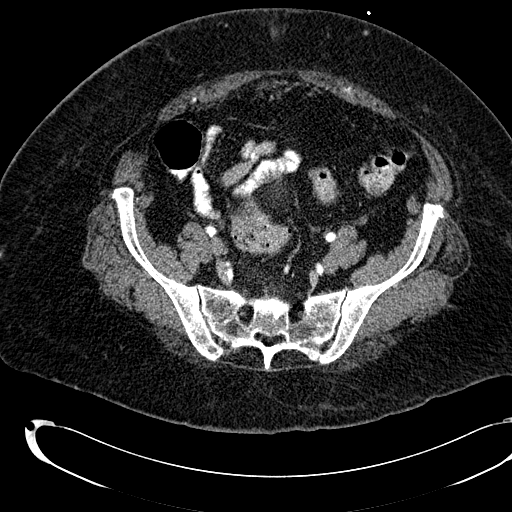
[im 99/323  lung]
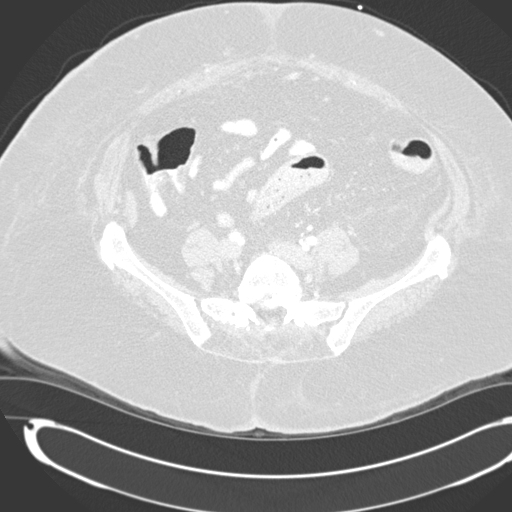
[im 127/323  mediastinal]
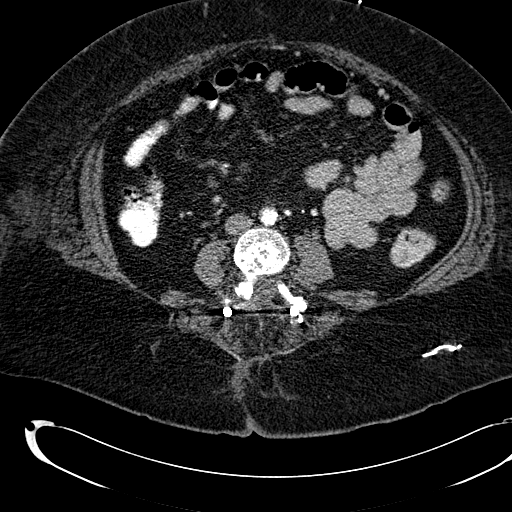
[im 141/323  lung]
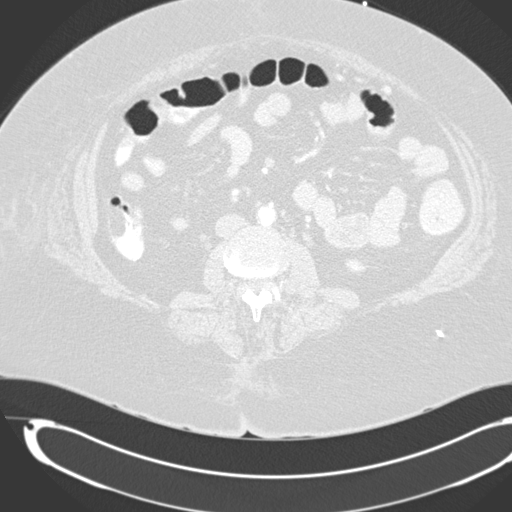
[im 169/323  mediastinal]
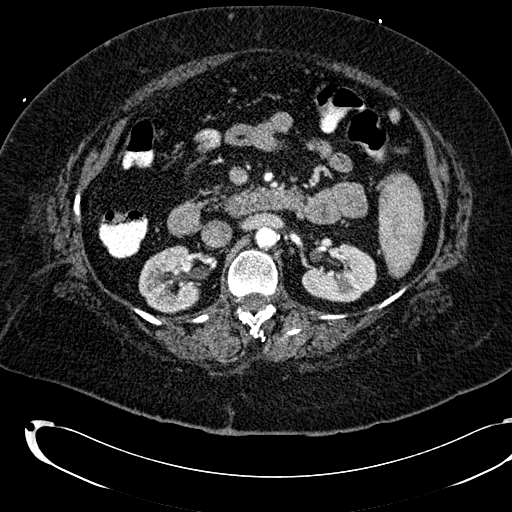
[im 183/323  lung]
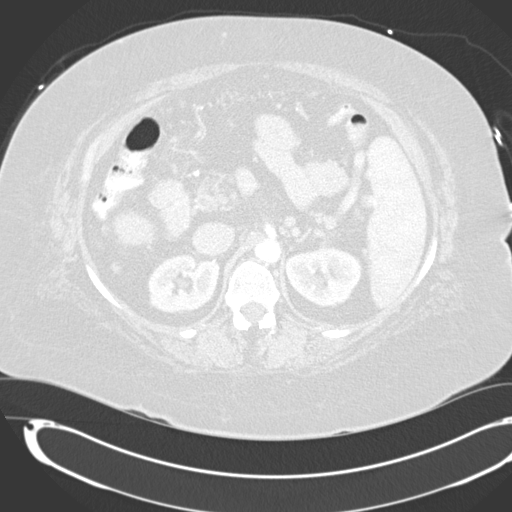
[im 197/323  mediastinal]
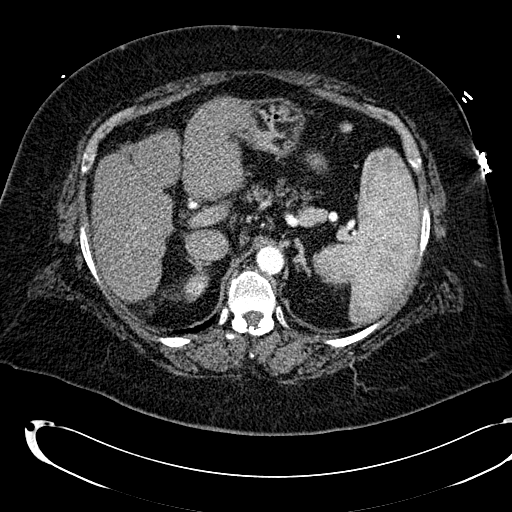
[im 225/323  lung]
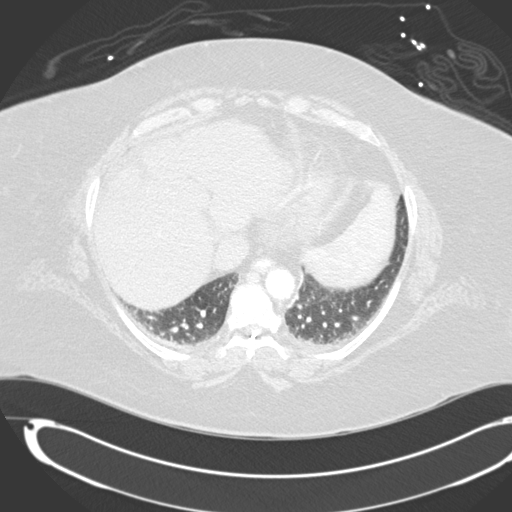
[im 239/323  mediastinal]
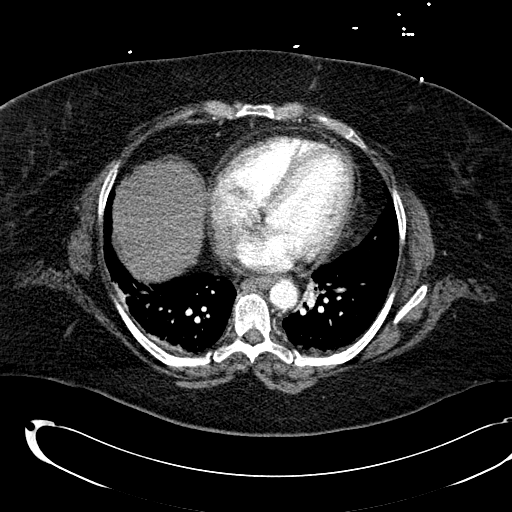
[im 267/323  lung]
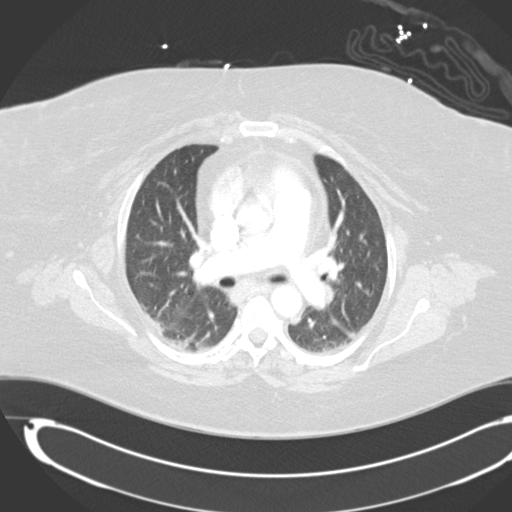
[im 281/323  mediastinal]
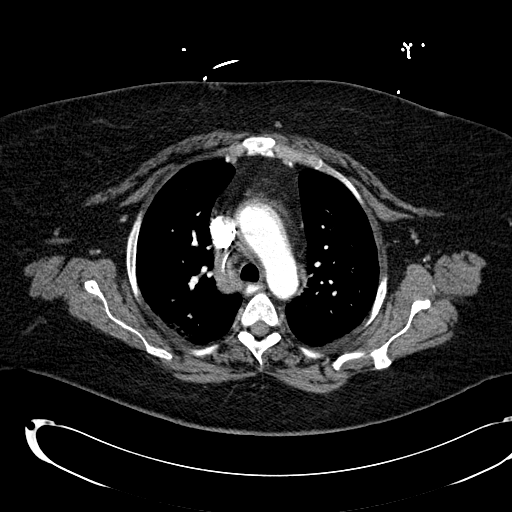
[im 309/323  lung]
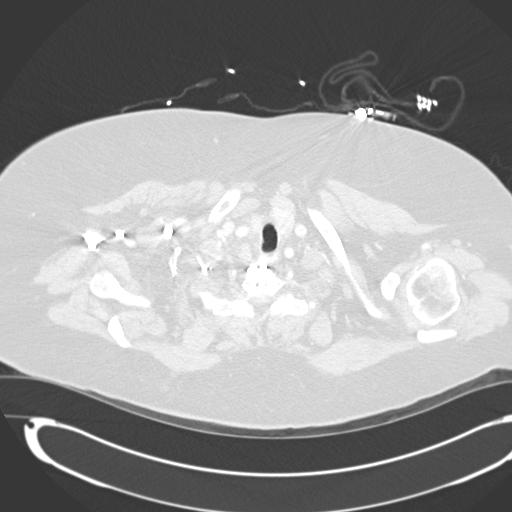

[Series 8: cor arterial mpr · coronal · arterial · 0.89mm/px · 1 of 164 slices shown]
[im 82/164  mediastinal]
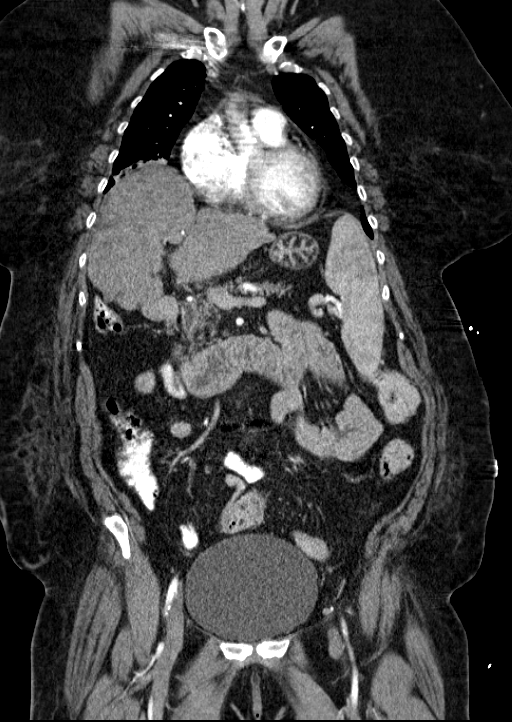

[16 of 36 positions shown; findings below may reference images not displayed]

FINDINGS: CTA CHEST FINDINGS

Cardiovascular: Precontrast imaging shows no hyperdense crescent in
the thoracic aortic wall to suggest acute intramural hematoma.
Imaging after IV contrast administration shows no thoracic aortic
aneurysm. There is no dissection of the thoracic aorta. Branch
vessels of the aortic arch are widely patent. No evidence for large
central filling defect in the pulmonary arteries to suggest the
presence of a central pulmonary embolus. The heart is enlarged.
Coronary artery calcification is noted. No evidence for pericardial
effusion.

Mediastinum/Nodes: Scattered mediastinal lymph nodes measure normal
to upper normal in size. No mediastinal lymphadenopathy. There is no
axillary lymphadenopathy. There is no hilar lymphadenopathy.
Contrast material in the esophageal lumen may be related to reflux
or dysmotility.

Lungs/Pleura: There is a trace amount of mucus identified in the
trachea. Subsegmental atelectasis in the right middle lobe is new in
the interval. The nodular airspace disease in the right mid lung on
the previous study is less prominent today. There is some dependent
atelectasis in the lower lungs bilaterally. No pleural effusion.

Musculoskeletal: Bone windows reveal no worrisome lytic or sclerotic
osseous lesions. Patient is status post lower cervical fusion,
incompletely visualized. Lower thoracic spinal stimulator device
noted.

Review of the MIP images confirms the above findings.

CTA ABDOMEN AND PELVIS FINDINGS

Hepatobiliary: Nodular liver contour compatible with cirrhosis.
Gallbladder surgically absent. No intrahepatic or extrahepatic
biliary dilation.

Pancreas: No focal mass lesion. No dilatation of the main duct. No
intraparenchymal cyst. No peripancreatic edema.

Spleen: Spleen measures 15.7 cm craniocaudal length, mildly
enlarged.

Adrenals/Urinary Tract: No adrenal nodule or mass. No evidence for
hydroureter. The urinary bladder appears normal for the degree of
distention.

Stomach/Bowel: Stomach is decompressed. Duodenum is normally
positioned as is the ligament of Treitz. No small bowel wall
thickening. No small bowel dilatation. The terminal ileum is normal.
The appendix is not visualized, but there is no edema or
inflammation in the region of the cecum. 2.2 cm fatty lesion
ascending colon is stable, compatible with lipoma. Colon otherwise
unremarkable.

Vascular/Lymphatic: There is abdominal aortic atherosclerosis
without aneurysm. No abdominal aortic aneurysm. No dissection of the
abdominal aorta. Celiac axis and superior mesenteric artery are
widely patent. Plaque is evident at the origin of the IMA although
this artery does opacify. Main right renal artery is widely patent.
Small accessory renal artery to the upper pole appears patent.
Calcific plaque is noted at the origin of the left main renal artery
which demonstrates early branching but no features to suggest
hemodynamically significant stenosis. Both common iliac and external
iliac arteries opacify normally.

There is no gastrohepatic or hepatoduodenal ligament
lymphadenopathy. No intraperitoneal or retroperitoneal
lymphadenopathy. No pelvic sidewall lymphadenopathy.

Reproductive: Uterus surgically absent. There is no adnexal mass.

Other: There is a trace amount of free fluid adjacent to the liver.

Musculoskeletal: Status post lower lumbar PLIF. No worrisome lytic
or sclerotic osseous abnormality.

Review of the MIP images confirms the above findings.
IMPRESSION: 1. No evidence for thoracoabdominal aortic aneurysm or dissection.
No large central pulmonary embolus.
2. Cirrhotic changes in the liver, as before, with splenomegaly
suggesting portal venous hypertension.
3. Emphysema with bibasilar dependent atelectasis. The patchy
airspace disease seen mainly in the right mid lung on the previous
study has improved in the interval and nearly resolved.
4. Abdominal aortic atherosclerosis.

## 2016-08-02 DIAGNOSIS — R5383 Other fatigue: Secondary | ICD-10-CM | POA: Diagnosis not present

## 2016-08-02 DIAGNOSIS — K7469 Other cirrhosis of liver: Secondary | ICD-10-CM | POA: Diagnosis not present

## 2016-08-02 DIAGNOSIS — Z79899 Other long term (current) drug therapy: Secondary | ICD-10-CM | POA: Diagnosis not present

## 2016-08-02 DIAGNOSIS — I10 Essential (primary) hypertension: Secondary | ICD-10-CM | POA: Diagnosis not present

## 2016-08-02 DIAGNOSIS — R3915 Urgency of urination: Secondary | ICD-10-CM | POA: Diagnosis not present

## 2016-08-02 DIAGNOSIS — K7581 Nonalcoholic steatohepatitis (NASH): Secondary | ICD-10-CM | POA: Diagnosis not present

## 2016-08-04 ENCOUNTER — Ambulatory Visit: Admission: RE | Admit: 2016-08-04 | Payer: Medicare Other | Source: Ambulatory Visit | Admitting: Surgery

## 2016-08-04 ENCOUNTER — Encounter: Admission: RE | Payer: Self-pay | Source: Ambulatory Visit

## 2016-08-04 DIAGNOSIS — M542 Cervicalgia: Secondary | ICD-10-CM | POA: Diagnosis not present

## 2016-08-04 DIAGNOSIS — Z0000 Encounter for general adult medical examination without abnormal findings: Secondary | ICD-10-CM | POA: Diagnosis not present

## 2016-08-04 DIAGNOSIS — E11649 Type 2 diabetes mellitus with hypoglycemia without coma: Secondary | ICD-10-CM | POA: Diagnosis not present

## 2016-08-04 DIAGNOSIS — I10 Essential (primary) hypertension: Secondary | ICD-10-CM | POA: Diagnosis not present

## 2016-08-04 SURGERY — ARTHROSCOPY, KNEE, WITH MENISCUS REPAIR
Anesthesia: Choice | Laterality: Right

## 2016-08-06 ENCOUNTER — Encounter: Payer: Self-pay | Admitting: Hematology and Oncology

## 2016-08-09 DIAGNOSIS — Z79899 Other long term (current) drug therapy: Secondary | ICD-10-CM | POA: Diagnosis not present

## 2016-08-11 DIAGNOSIS — G629 Polyneuropathy, unspecified: Secondary | ICD-10-CM | POA: Diagnosis not present

## 2016-08-11 DIAGNOSIS — M25551 Pain in right hip: Secondary | ICD-10-CM | POA: Diagnosis not present

## 2016-08-11 DIAGNOSIS — M25552 Pain in left hip: Secondary | ICD-10-CM | POA: Diagnosis not present

## 2016-08-11 DIAGNOSIS — M25511 Pain in right shoulder: Secondary | ICD-10-CM | POA: Diagnosis not present

## 2016-08-16 DIAGNOSIS — I10 Essential (primary) hypertension: Secondary | ICD-10-CM | POA: Diagnosis not present

## 2016-08-16 DIAGNOSIS — K7469 Other cirrhosis of liver: Secondary | ICD-10-CM | POA: Diagnosis not present

## 2016-08-18 DIAGNOSIS — R269 Unspecified abnormalities of gait and mobility: Secondary | ICD-10-CM | POA: Diagnosis not present

## 2016-08-18 DIAGNOSIS — I10 Essential (primary) hypertension: Secondary | ICD-10-CM | POA: Diagnosis not present

## 2016-08-22 DIAGNOSIS — L851 Acquired keratosis [keratoderma] palmaris et plantaris: Secondary | ICD-10-CM | POA: Diagnosis not present

## 2016-08-22 DIAGNOSIS — B070 Plantar wart: Secondary | ICD-10-CM | POA: Diagnosis not present

## 2016-08-22 DIAGNOSIS — M79673 Pain in unspecified foot: Secondary | ICD-10-CM | POA: Diagnosis not present

## 2016-08-22 DIAGNOSIS — B351 Tinea unguium: Secondary | ICD-10-CM | POA: Diagnosis not present

## 2016-08-24 DIAGNOSIS — I10 Essential (primary) hypertension: Secondary | ICD-10-CM | POA: Diagnosis not present

## 2016-08-24 DIAGNOSIS — M25551 Pain in right hip: Secondary | ICD-10-CM | POA: Diagnosis not present

## 2016-08-24 DIAGNOSIS — R600 Localized edema: Secondary | ICD-10-CM | POA: Diagnosis not present

## 2016-08-24 DIAGNOSIS — M25511 Pain in right shoulder: Secondary | ICD-10-CM | POA: Diagnosis not present

## 2016-08-25 DIAGNOSIS — M79621 Pain in right upper arm: Secondary | ICD-10-CM | POA: Diagnosis not present

## 2016-08-29 DIAGNOSIS — I890 Lymphedema, not elsewhere classified: Secondary | ICD-10-CM | POA: Diagnosis not present

## 2016-09-01 DIAGNOSIS — R600 Localized edema: Secondary | ICD-10-CM | POA: Diagnosis not present

## 2016-09-01 DIAGNOSIS — M25552 Pain in left hip: Secondary | ICD-10-CM | POA: Diagnosis not present

## 2016-09-01 DIAGNOSIS — M25561 Pain in right knee: Secondary | ICD-10-CM | POA: Diagnosis not present

## 2016-09-01 DIAGNOSIS — M25511 Pain in right shoulder: Secondary | ICD-10-CM | POA: Diagnosis not present

## 2016-09-01 DIAGNOSIS — M25551 Pain in right hip: Secondary | ICD-10-CM | POA: Diagnosis not present

## 2016-09-13 ENCOUNTER — Encounter (INDEPENDENT_AMBULATORY_CARE_PROVIDER_SITE_OTHER): Payer: Self-pay | Admitting: Vascular Surgery

## 2016-09-13 ENCOUNTER — Ambulatory Visit (INDEPENDENT_AMBULATORY_CARE_PROVIDER_SITE_OTHER): Payer: Medicare Other | Admitting: Vascular Surgery

## 2016-09-13 VITALS — BP 131/75 | HR 84 | Resp 17 | Wt 223.0 lb

## 2016-09-13 DIAGNOSIS — E118 Type 2 diabetes mellitus with unspecified complications: Secondary | ICD-10-CM

## 2016-09-13 DIAGNOSIS — I890 Lymphedema, not elsewhere classified: Secondary | ICD-10-CM

## 2016-09-13 DIAGNOSIS — M79604 Pain in right leg: Secondary | ICD-10-CM

## 2016-09-13 DIAGNOSIS — E1165 Type 2 diabetes mellitus with hyperglycemia: Secondary | ICD-10-CM

## 2016-09-13 DIAGNOSIS — M7989 Other specified soft tissue disorders: Secondary | ICD-10-CM | POA: Diagnosis not present

## 2016-09-13 DIAGNOSIS — I10 Essential (primary) hypertension: Secondary | ICD-10-CM

## 2016-09-13 NOTE — Progress Notes (Signed)
MRN : [id number]  Stephanie [name] is a 66 y.o. (June 16, 1950) female who presents with chief complaint of  Chief Complaint  Patient presents with  . Follow-up  .  History of Present Illness: Patient returns today in follow up of leg swelling and numbness in her right leg.  She is doing reasonably well without any major complaints today.  She reports wearing stocking and elevating her legs with mild improvement in her swelling.  Her numbness in the feet and lower legs persists.  Denies fever or chills.  No chest pain or shortness of breath.         Past Medical History:  Diagnosis Date  . Adenomatous colon polyp 08/27/2014   Polyps x 3  . Anemia   . Anxiety   . 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 Arthritis   . Asthma   . Bipolar affective disorder ([place])    h/o  . Blood transfusion 2000  . Blood transfusion without reported diagnosis   . Chronic abdominal pain    abdominal wall pain  . 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 Colon polyp   . Depression   . Diabetes mellitus without complication ([place])   . Fatty liver 04/09/08   found in abd CT  . GERD (gastroesophageal reflux disease)   . H/O: CVA (cardiovascular accident)    TIA  . H/O: rheumatic fever   . Hyperlipidemia   . Incontinence   . Insomnia   . Obstructive sleep apnea    not using CPAP, last sleep study 2.5 years ago, per pt doctor is aware  . Platelets decreased ([place])   . RLS (restless legs syndrome)   . Shortness of breath   . Sleep apnea   . Stroke [facility])    TIA- 2002  . Ulcer ([place])   . Unspecified disorders of nervous system          Past Surgical History:  Procedure Laterality Date  . ABDOMINAL EXPLORATION SURGERY    . ABDOMINAL HYSTERECTOMY     total  . Axillary artery cannulation via 8-mm Hemashield graft, median sternotomy, extracorporeal circulation with deep hypothermic circulatory arrest, repair of  aortic dessection  01/23/2009   Dr Arlyce Dice  .  BACK SURGERY     x 45  . cataract     both eyes  . CHOLECYSTECTOMY    . COLONOSCOPY    . EYE SURGERY     bilat  . HEMORROIDECTOMY     and colon polyp removed  . LIVER BIOPSY  04/10/2012   Procedure: LIVER BIOPSY;  Surgeon: Inda Castle, MD;  Location: [facility];  Service: Endoscopy;  Laterality: N/A;  ultrasound to mark order for abd limited/liver to be marked to be sent by [email or web address]   . LUMBAR FUSION  10/09  . LUMBAR WOUND DEBRIDEMENT  05/09/2011   Procedure: LUMBAR WOUND DEBRIDEMENT;  Surgeon: Dahlia Bailiff;  Location: [place];  Service: Orthopedics;  Laterality: N/A;  IRRIGATION AND DEBRIDEMENT SPINAL WOUND  . POSTERIOR CERVICAL FUSION/FORAMINOTOMY    . ROTATOR CUFF REPAIR     left          Family History  Problem Relation Age of Onset  . Breast cancer Mother   . Lung cancer Mother   . Stomach cancer Father   . Diabetes      4 aunts, and 1 uncle  . Colon cancer Neg Hx   . Esophageal cancer Neg Hx   .  Rectal cancer Neg Hx      Social History      Social History  Substance Use Topics  . Smoking status: Former Smoker    Packs/day: 1.50    Years: 50.00    Types: Cigarettes    Quit date: 04/06/2013  . Smokeless tobacco: Never Used  . Alcohol use No  Lives in a facility       Allergies  Allergen Reactions  . Doxycycline Hives, Swelling and Other (See Comments)    Reaction:  Facial swelling           Current Outpatient Prescriptions  Medication Sig Dispense Refill  . acetaminophen (TYLENOL) 325 MG tablet Take 650 mg by mouth every 6 (six) hours as needed.    Marland Kitchen albuterol (VENTOLIN HFA) 108 (90 Base) MCG/ACT inhaler Inhale 2 puffs into the lungs every 6 (six) hours as needed for wheezing or shortness of breath. 1 Inhaler 11  . ARIPiprazole (ABILIFY) 30 MG tablet Take 30 mg by mouth daily.    . budesonide-formoterol (SYMBICORT) 160-4.5 MCG/ACT inhaler Inhale 2 puffs into the lungs 2 (two) times daily.     Marland Kitchen buPROPion (WELLBUTRIN SR) 150 MG 12 hr tablet Take 150 mg by mouth daily.    . clonazePAM (KLONOPIN) 0.5 MG tablet Take 0.5 mg by mouth 2 (two) times daily.    Marland Kitchen dicyclomine (BENTYL) 10 MG capsule Take 10 mg by mouth 3 (three) times daily.     . ferrous sulfate 325 (65 FE) MG tablet Take 325 mg by mouth 2 (two) times daily.     . furosemide (LASIX) 80 MG tablet Take 80 mg by mouth daily.    Marland Kitchen gabapentin (NEURONTIN) 600 MG tablet Take 600 mg by mouth 2 (two) times daily.     . insulin glargine (LANTUS) 100 UNIT/ML injection Inject 20 Units into the skin at bedtime.    . insulin lispro (HUMALOG) 100 UNIT/ML injection Inject 0-9 Units into the skin 3 (three) times daily before meals. Pt uses as needed per sliding scale:    150-200:  0 units  201-250:  5 units  251-300:  8 units 301-350:  9 units  Greater than 350:  Call MD    . lactulose (CHRONULAC) 10 GM/15ML solution Take 10 g by mouth daily.    Marland Kitchen lamoTRIgine (LAMICTAL) 100 MG tablet Take 100 mg by mouth 2 (two) times daily.    Marland Kitchen loperamide (IMODIUM) 2 MG capsule Take 4 mg by mouth as needed for diarrhea or loose stools. *Do not exceed 8 doses in 24 hours*    . LORazepam (ATIVAN) 0.5 MG tablet Take 0.5 mg by mouth daily as needed for anxiety.     . metFORMIN (GLUCOPHAGE) 500 MG tablet Take 500 mg by mouth daily. Take 2 tablets (1000 mg) by mouth every morning with meal, and take 1 tablet by mouth every evening at 5 pm with meal.    . metoCLOPramide (REGLAN) 5 MG tablet Take 5 mg by mouth 4 (four) times daily. Before meals and at bedtime    . mirtazapine (REMERON) 15 MG tablet Take 15 mg by mouth at bedtime.    . Multiple Vitamin (MULTIVITAMIN WITH MINERALS) TABS tablet Take 1 tablet by mouth daily with breakfast.     . omeprazole (PRILOSEC) 40 MG capsule Take 40 mg by mouth daily before breakfast.    . oxybutynin (DITROPAN-XL) 5 MG 24 hr tablet Take 5 mg by mouth daily.    Marland Kitchen oxyCODONE (OXY  IR/ROXICODONE)  5 MG immediate release tablet Take 5 mg by mouth every 12 (twelve) hours as needed for severe pain.     . pioglitazone (ACTOS) 30 MG tablet Take 30 mg by mouth daily.    . potassium chloride SA (K-DUR,KLOR-CON) 20 MEQ tablet Take 20 mEq by mouth daily. *Note dose*    . pregabalin (LYRICA) 150 MG capsule Take 150 mg by mouth 3 (three) times daily.     Marland Kitchen rOPINIRole (REQUIP) 3 MG tablet Take 3 mg by mouth at bedtime.    . senna (SENOKOT) 8.6 MG tablet Take 1 tablet by mouth 3 (three) times a week. Take on Monday, Wednesday, and Friday. *Hold for loose stools*    . simvastatin (ZOCOR) 10 MG tablet Take 10 mg by mouth at bedtime.     Marland Kitchen spironolactone (ALDACTONE) 25 MG tablet Take 25 mg by mouth daily.    . traMADol (ULTRAM) 50 MG tablet Take 50 mg by mouth every 6 (six) hours as needed for moderate pain.    . vitamin C (ASCORBIC ACID) 500 MG tablet Take 500 mg by mouth 2 (two) times daily.    Marland Kitchen zolpidem (AMBIEN) 5 MG tablet Take 5 mg by mouth at bedtime.      No current facility-administered medications for this visit.       REVIEW OF SYSTEMS (Negative unless checked)  Constitutional: [] Weight loss  [] Fever  [] Chills Cardiac: [] Chest pain   [] Chest pressure   [x] Palpitations   [] Shortness of breath when laying flat   [] Shortness of breath at rest   [] Shortness of breath with exertion. Vascular:  [] Pain in legs with walking   [x] Pain in legs at rest   [] Pain in legs when laying flat   [] Claudication   [] Pain in feet when walking  [] Pain in feet at rest  [] Pain in feet when laying flat   [x] History of DVT   [x] Phlebitis   [x] Swelling in legs   [] Varicose veins   [] Non-healing ulcers Pulmonary:   [] Uses home oxygen   [] Productive cough   [] Hemoptysis   [] Wheeze  [] COPD   [] Asthma Neurologic:  [] Dizziness  [] Blackouts   [] Seizures   [] History of stroke   [] History of TIA  [] Aphasia   [] Temporary blindness   [] Dysphagia   [] Weakness or numbness in arms    [x] Weakness or numbness in legs Musculoskeletal:  [x] Arthritis   [] Joint swelling   [x] Joint pain   [] Low back pain Hematologic:  [] Easy bruising  [] Easy bleeding   [] Hypercoagulable state   [] Anemic  [] Hepatitis Gastrointestinal:  [] Blood in stool   [] Vomiting blood  [] Gastroesophageal reflux/heartburn   [] Abdominal pain Genitourinary:  [] Chronic kidney disease   [] Difficult urination  [] Frequent urination  [] Burning with urination   [] Hematuria Skin:  [] Rashes   [] Ulcers   [] Wounds Psychological:  [x] History of anxiety   []  History of major depression.    Physical Examination  BP 131/75   Pulse 84   Resp 17   Wt 101.2 kg (223 lb)   BMI 40.79 kg/m  Gen:  WD/WN, NAD Head: [place]/AT, No temporalis wasting. Ear/Nose/Throat: Hearing grossly intact, nares w/o erythema or drainage, trachea midline Eyes: Conjunctiva clear. Sclera non-icteric Neck: Supple.  No JVD.  Pulmonary:  Good air movement, no use of accessory muscles.  Cardiac: RRR, normal S1, S2 Vascular:  Vessel Right Left  Radial Palpable Palpable  Ulnar Palpable Palpable  Brachial Palpable Palpable  Carotid Palpable, without bruit Palpable, without bruit  Aorta Not palpable N/A  Femoral Palpable Palpable  Popliteal Palpable Palpable  PT 1+ Palpable 1+ Palpable  DP Palpable Palpable   Gastrointestinal: soft, non-tender/non-distended.   Musculoskeletal: M/S 5/5 throughout.  No deformity or atrophy. 1-2+ RLE edema, trace LLE edema. Neurologic: Sensation grossly intact in extremities.  Symmetrical.  Speech is fluent.  Psychiatric: Judgment intact, affect seems somewhat anxious Dermatologic: No rashes or ulcers noted.  No cellulitis or open wounds.       Labs Recent Results (from the past 2160 hour(s))  Comprehensive metabolic panel     Status: Abnormal   Collection Time: 07/19/16  9:13 AM  Result Value Ref Range   Sodium 138 135 - 145 mmol/L   Potassium 3.5 3.5 - 5.1 mmol/L   Chloride 101 101 - 111 mmol/L   CO2 31  22 - 32 mmol/L   Glucose, Bld 193 (H) 65 - 99 mg/dL   BUN 17 6 - 20 mg/dL   Creatinine, Ser 1.05 (H) 0.44 - 1.00 mg/dL   Calcium 9.0 8.9 - 10.3 mg/dL   Total Protein 6.7 6.5 - 8.1 g/dL   Albumin 3.4 (L) 3.5 - 5.0 g/dL   AST 34 15 - 41 U/L   ALT 19 14 - 54 U/L   Alkaline Phosphatase 82 38 - 126 U/L   Total Bilirubin 0.7 0.3 - 1.2 mg/dL   GFR calc non Af Amer 55 (L) >60 mL/min   GFR calc Af Amer >60 >60 mL/min    Comment: (NOTE) The eGFR has been calculated using the CKD EPI equation. This calculation has not been validated in all clinical situations. eGFR's persistently <60 mL/min signify possible Chronic Kidney Disease.    Anion gap 6 5 - 15  Glucose, capillary     Status: Abnormal   Collection Time: 07/19/16  9:17 AM  Result Value Ref Range   Glucose-Capillary 156 (H) 65 - 99 mg/dL  Urinalysis, Complete w Microscopic     Status: Abnormal   Collection Time: 07/19/16  9:28 AM  Result Value Ref Range   Color, Urine YELLOW (A) YELLOW   APPearance CLEAR (A) CLEAR   Specific Gravity, Urine 1.011 1.005 - 1.030   pH 7.0 5.0 - 8.0   Glucose, UA NEGATIVE NEGATIVE mg/dL   Hgb urine dipstick NEGATIVE NEGATIVE   Bilirubin Urine NEGATIVE NEGATIVE   Ketones, ur NEGATIVE NEGATIVE mg/dL   Protein, ur NEGATIVE NEGATIVE mg/dL   Nitrite NEGATIVE NEGATIVE   Leukocytes, UA NEGATIVE NEGATIVE   RBC / HPF NONE SEEN 0 - 5 RBC/hpf   WBC, UA 0-5 0 - 5 WBC/hpf   Bacteria, UA NONE SEEN NONE SEEN   Squamous Epithelial / LPF 0-5 (A) NONE SEEN  Lactic acid, plasma     Status: None   Collection Time: 07/19/16  9:28 AM  Result Value Ref Range   Lactic Acid, Venous 1.4 0.5 - 1.9 mmol/L  CBC with Differential     Status: Abnormal   Collection Time: 07/19/16 11:11 AM  Result Value Ref Range   WBC 5.6 3.6 - 11.0 K/uL   RBC 3.79 (L) 3.80 - 5.20 MIL/uL   Hemoglobin 11.7 (L) 12.0 - 16.0 g/dL   HCT 35.2 35.0 - 47.0 %   MCV 92.7 80.0 - 100.0 fL   MCH 31.0 26.0 - 34.0 pg   MCHC 33.4 32.0 - 36.0 g/dL    RDW 16.2 (H) 11.5 - 14.5 %   Platelets 81 (L) 150 - 440 K/uL   Neutrophils Relative % 82 %   Neutro Abs 4.7 1.4 -  6.5 K/uL   Lymphocytes Relative 8 %   Lymphs Abs 0.4 (L) 1.0 - 3.6 K/uL   Monocytes Relative 9 %   Monocytes Absolute 0.5 0.2 - 0.9 K/uL   Eosinophils Relative 0 %   Eosinophils Absolute 0.0 0 - 0.7 K/uL   Basophils Relative 1 %   Basophils Absolute 0.0 0 - 0.1 K/uL  Glucose, capillary     Status: None   Collection Time: 07/19/16 11:13 AM  Result Value Ref Range   Glucose-Capillary 88 65 - 99 mg/dL  Glucose, capillary     Status: Abnormal   Collection Time: 07/19/16 12:30 PM  Result Value Ref Range   Glucose-Capillary 145 (H) 65 - 99 mg/dL  Glucose, capillary     Status: Abnormal   Collection Time: 07/19/16  4:22 PM  Result Value Ref Range   Glucose-Capillary 101 (H) 65 - 99 mg/dL  MRSA PCR Screening     Status: None   Collection Time: 07/19/16  4:56 PM  Result Value Ref Range   MRSA by PCR NEGATIVE NEGATIVE    Comment:        The GeneXpert MRSA Assay (FDA approved for NASAL specimens only), is one component of a comprehensive MRSA colonization surveillance program. It is not intended to diagnose MRSA infection nor to guide or monitor treatment for MRSA infections.   Glucose, capillary     Status: Abnormal   Collection Time: 07/19/16  6:16 PM  Result Value Ref Range   Glucose-Capillary 199 (H) 65 - 99 mg/dL  Glucose, capillary     Status: Abnormal   Collection Time: 07/19/16  8:41 PM  Result Value Ref Range   Glucose-Capillary 149 (H) 65 - 99 mg/dL  Comprehensive metabolic panel     Status: Abnormal   Collection Time: 07/20/16  4:28 AM  Result Value Ref Range   Sodium 140 135 - 145 mmol/L   Potassium 3.9 3.5 - 5.1 mmol/L   Chloride 106 101 - 111 mmol/L   CO2 29 22 - 32 mmol/L   Glucose, Bld 155 (H) 65 - 99 mg/dL   BUN 12 6 - 20 mg/dL   Creatinine, Ser 0.94 0.44 - 1.00 mg/dL   Calcium 8.5 (L) 8.9 - 10.3 mg/dL   Total Protein 6.2 (L) 6.5 - 8.1 g/dL     Albumin 3.0 (L) 3.5 - 5.0 g/dL   AST 25 15 - 41 U/L   ALT 19 14 - 54 U/L   Alkaline Phosphatase 78 38 - 126 U/L   Total Bilirubin 0.6 0.3 - 1.2 mg/dL   GFR calc non Af Amer >60 >60 mL/min   GFR calc Af Amer >60 >60 mL/min    Comment: (NOTE) The eGFR has been calculated using the CKD EPI equation. This calculation has not been validated in all clinical situations. eGFR's persistently <60 mL/min signify possible Chronic Kidney Disease.    Anion gap 5 5 - 15  CBC     Status: Abnormal   Collection Time: 07/20/16  4:28 AM  Result Value Ref Range   WBC 4.5 3.6 - 11.0 K/uL   RBC 3.71 (L) 3.80 - 5.20 MIL/uL   Hemoglobin 11.7 (L) 12.0 - 16.0 g/dL   HCT 34.8 (L) 35.0 - 47.0 %   MCV 93.6 80.0 - 100.0 fL   MCH 31.5 26.0 - 34.0 pg   MCHC 33.6 32.0 - 36.0 g/dL   RDW 16.2 (H) 11.5 - 14.5 %   Platelets 76 (L) 150 - 440 K/uL  Hemoglobin A1c     Status: None   Collection Time: 07/20/16  4:28 AM  Result Value Ref Range   Hgb A1c MFr Bld 5.2 4.8 - 5.6 %    Comment: (NOTE)         Pre-diabetes: 5.7 - 6.4         Diabetes: >6.4         Glycemic control for adults with diabetes: <7.0    Mean Plasma Glucose 103 mg/dL    Comment: (NOTE) Performed At: [facility] ([facility] [address] [place], [place] [id number] Lindon Romp MD [id number]   Glucose, capillary     Status: None   Collection Time: 07/20/16  8:56 AM  Result Value Ref Range   Glucose-Capillary 78 65 - 99 mg/dL  Glucose, capillary     Status: Abnormal   Collection Time: 07/20/16 11:45 AM  Result Value Ref Range   Glucose-Capillary 109 (H) 65 - 99 mg/dL  Glucose, capillary     Status: Abnormal   Collection Time: 07/20/16  4:28 PM  Result Value Ref Range   Glucose-Capillary 116 (H) 65 - 99 mg/dL  Glucose, capillary     Status: Abnormal   Collection Time: 07/20/16  8:57 PM  Result Value Ref Range   Glucose-Capillary 130 (H) 65 - 99 mg/dL  Glucose, capillary     Status: Abnormal   Collection Time: 07/21/16  8:00 AM   Result Value Ref Range   Glucose-Capillary 102 (H) 65 - 99 mg/dL  Influenza panel by PCR (type A & B)     Status: None   Collection Time: 07/21/16 11:31 AM  Result Value Ref Range   Influenza A By PCR NEGATIVE NEGATIVE   Influenza B By PCR NEGATIVE NEGATIVE    Comment: (NOTE) The Xpert Xpress Flu assay is intended as an aid in the diagnosis of  influenza and should not be used as a sole basis for treatment.  This  assay is FDA approved for nasopharyngeal swab specimens only. Nasal  washings and aspirates are unacceptable for Xpert Xpress Flu testing.   Glucose, capillary     Status: Abnormal   Collection Time: 07/21/16 11:58 AM  Result Value Ref Range   Glucose-Capillary 136 (H) 65 - 99 mg/dL   Comment 1 Notify RN   Surgical pcr screen     Status: Abnormal   Collection Time: 07/28/16 10:17 AM  Result Value Ref Range   MRSA, PCR POSITIVE (A) NEGATIVE    Comment: RESULT CALLED TO, READ BACK BY AND VERIFIED WITH:  GAIL GRAVES AT 1300 07/28/16 SDR    Staphylococcus aureus POSITIVE (A) NEGATIVE    Comment:        The Xpert SA Assay (FDA approved for NASAL specimens in patients over 74 years of age), is one component of a comprehensive surveillance program.  Test performance has been validated by [facility] for patients greater than or equal to 32 year old. It is not intended to diagnose infection nor to guide or monitor treatment.   Basic metabolic panel     Status: Abnormal   Collection Time: 07/28/16 10:22 AM  Result Value Ref Range   Sodium 140 135 - 145 mmol/L   Potassium 4.0 3.5 - 5.1 mmol/L   Chloride 108 101 - 111 mmol/L   CO2 28 22 - 32 mmol/L   Glucose, Bld 84 65 - 99 mg/dL   BUN 17 6 - 20 mg/dL   Creatinine, Ser 0.84 0.44 - 1.00 mg/dL  Calcium 9.2 8.9 - 10.3 mg/dL   GFR calc non Af Amer >60 >60 mL/min   GFR calc Af Amer >60 >60 mL/min    Comment: (NOTE) The eGFR has been calculated using the CKD EPI equation. This calculation has not been validated in all  clinical situations. eGFR's persistently <60 mL/min signify possible Chronic Kidney Disease.    Anion gap 4 (L) 5 - 15  CBC     Status: Abnormal   Collection Time: 07/28/16 10:22 AM  Result Value Ref Range   WBC 5.5 3.6 - 11.0 K/uL   RBC 3.81 3.80 - 5.20 MIL/uL   Hemoglobin 12.1 12.0 - 16.0 g/dL   HCT 35.4 35.0 - 47.0 %   MCV 92.9 80.0 - 100.0 fL   MCH 31.7 26.0 - 34.0 pg   MCHC 34.1 32.0 - 36.0 g/dL   RDW 16.4 (H) 11.5 - 14.5 %   Platelets 88 (L) 150 - 440 K/uL  Hepatic function panel     Status: Abnormal   Collection Time: 07/28/16 10:22 AM  Result Value Ref Range   Total Protein 7.0 6.5 - 8.1 g/dL   Albumin 3.5 3.5 - 5.0 g/dL   AST 39 15 - 41 U/L   ALT 29 14 - 54 U/L   Alkaline Phosphatase 130 (H) 38 - 126 U/L   Total Bilirubin 0.9 0.3 - 1.2 mg/dL   Bilirubin, Direct 0.1 0.1 - 0.5 mg/dL   Indirect Bilirubin 0.8 0.3 - 0.9 mg/dL  Brain natriuretic peptide     Status: Abnormal   Collection Time: 07/28/16 10:22 AM  Result Value Ref Range   B Natriuretic Peptide 108.0 (H) 0.0 - 100.0 pg/mL  Influenza panel by PCR (type A & B)     Status: None   Collection Time: 07/28/16 11:16 AM  Result Value Ref Range   Influenza A By PCR NEGATIVE NEGATIVE   Influenza B By PCR NEGATIVE NEGATIVE    Comment: (NOTE) The Xpert Xpress Flu assay is intended as an aid in the diagnosis of  influenza and should not be used as a sole basis for treatment.  This  assay is FDA approved for nasopharyngeal swab specimens only. Nasal  washings and aspirates are unacceptable for Xpert Xpress Flu testing.     Radiology No results found.    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 Also, involved in wound healing. On appropriate medications. May have caused some degree of neuropathy as well that could explain her lower extremity symptoms.   Essential hypertension blood  pressure control important in reducing the progression of atherosclerotic disease. On appropriate oral medications.  Swelling of limb Stable to slightly improved. Continue daily use of compression stockings, leg elevation, and exercise. Weight loss would also be of benefit.  Pain in limb I suspect this is a mixed source of neuropathy, musculoskeletal issues, and some of her swelling with lymphedema. Venous study was negative.  Lymphedema Continue compression stockings, elevation, and exercise. Lymphedema pump helpful adjuvant therapy. Recheck in 3-6 months.    Leotis Pain, MD  09/14/2016 1:44 PM    This note was created with Dragon medical transcription system.  Any errors from dictation are purely unintentional

## 2016-09-14 NOTE — Assessment & Plan Note (Signed)
I suspect this is a mixed source of neuropathy, musculoskeletal issues, and some of her swelling with lymphedema. Venous study was negative.

## 2016-09-14 NOTE — Assessment & Plan Note (Signed)
Continue compression stockings, elevation, and exercise. Lymphedema pump helpful adjuvant therapy. Recheck in 3-6 months.

## 2016-09-14 NOTE — Assessment & Plan Note (Signed)
Stable to slightly improved. Continue daily use of compression stockings, leg elevation, and exercise. Weight loss would also be of benefit.

## 2016-09-15 DIAGNOSIS — M25551 Pain in right hip: Secondary | ICD-10-CM | POA: Diagnosis not present

## 2016-09-15 DIAGNOSIS — I10 Essential (primary) hypertension: Secondary | ICD-10-CM | POA: Diagnosis not present

## 2016-09-15 DIAGNOSIS — M25561 Pain in right knee: Secondary | ICD-10-CM | POA: Diagnosis not present

## 2016-09-19 ENCOUNTER — Encounter: Payer: Self-pay | Admitting: Hematology and Oncology

## 2016-09-19 DIAGNOSIS — D696 Thrombocytopenia, unspecified: Secondary | ICD-10-CM | POA: Diagnosis not present

## 2016-09-19 DIAGNOSIS — D509 Iron deficiency anemia, unspecified: Secondary | ICD-10-CM | POA: Diagnosis not present

## 2016-09-20 IMAGING — CR DG TIBIA/FIBULA 2V*R*
3 series · 3 of 3 positions shown · non-contrast
Comparison: None.

CLINICAL DATA: Cellulitis and swelling.

EXAM:
RIGHT TIBIA AND FIBULA - 2 VIEW

[tibia ap]
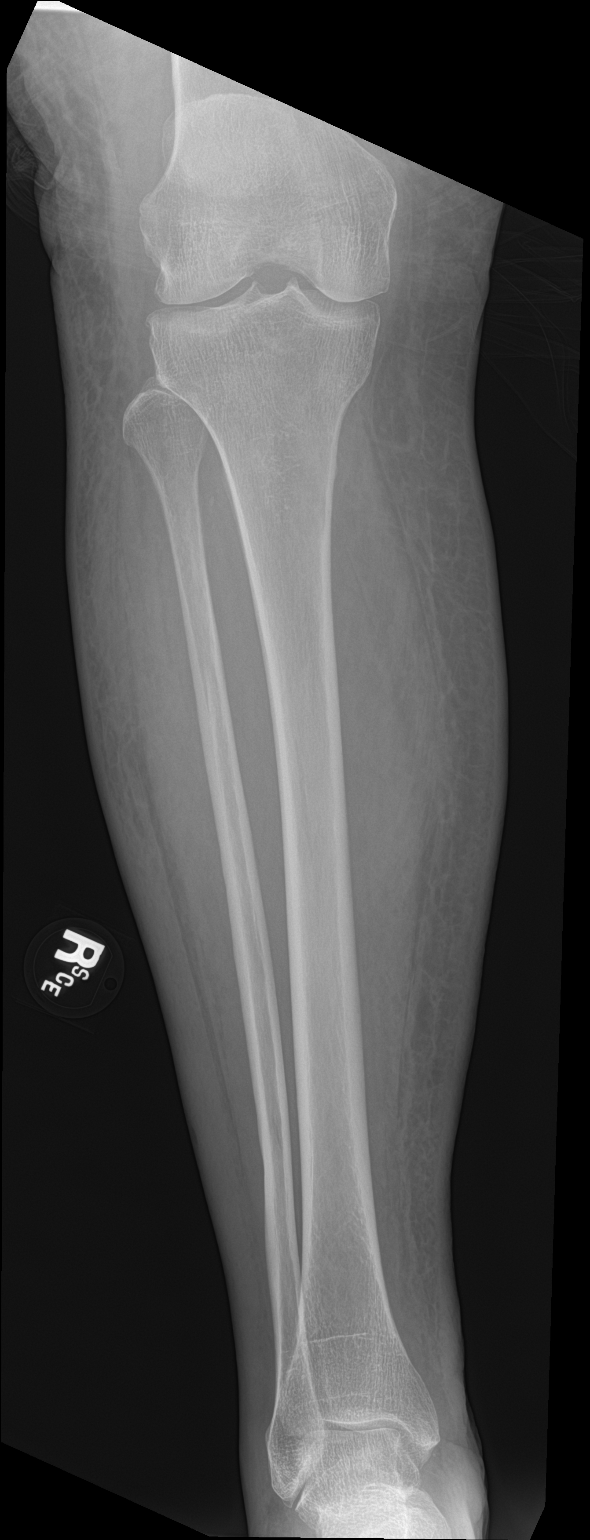

[tibia lat (1 of 2)]
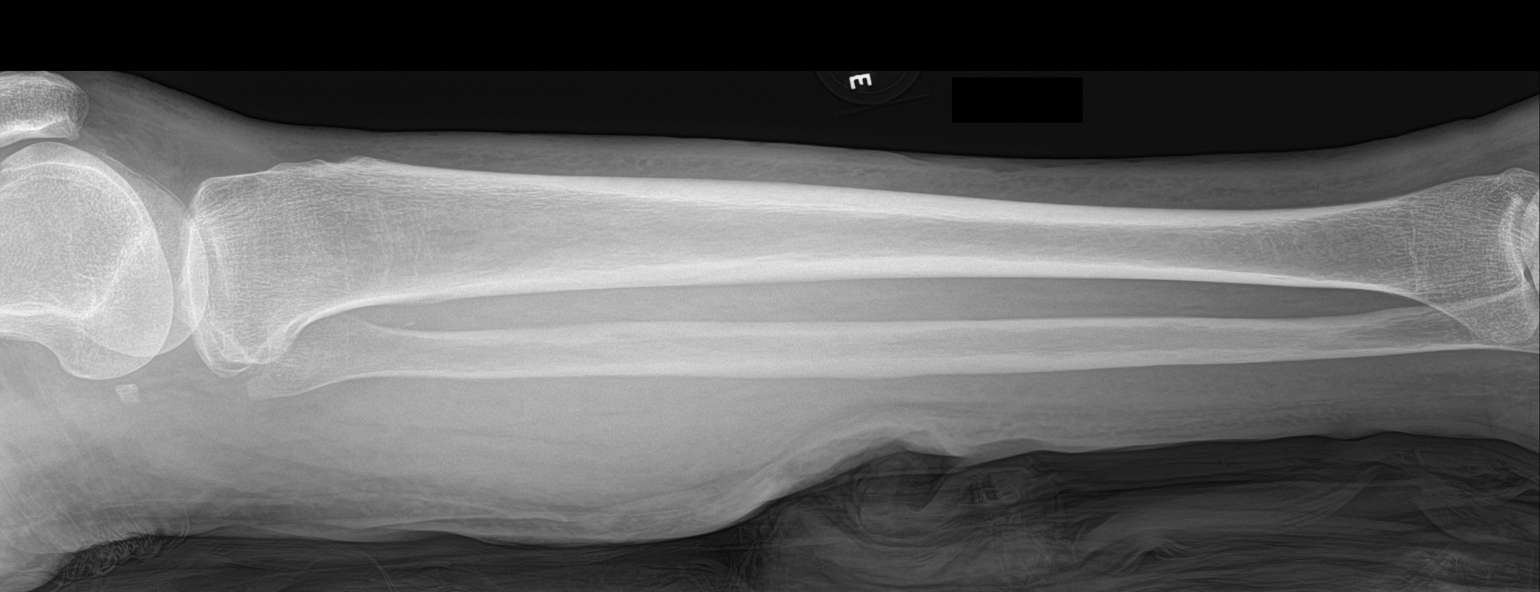

[tibia lat (2 of 2)]
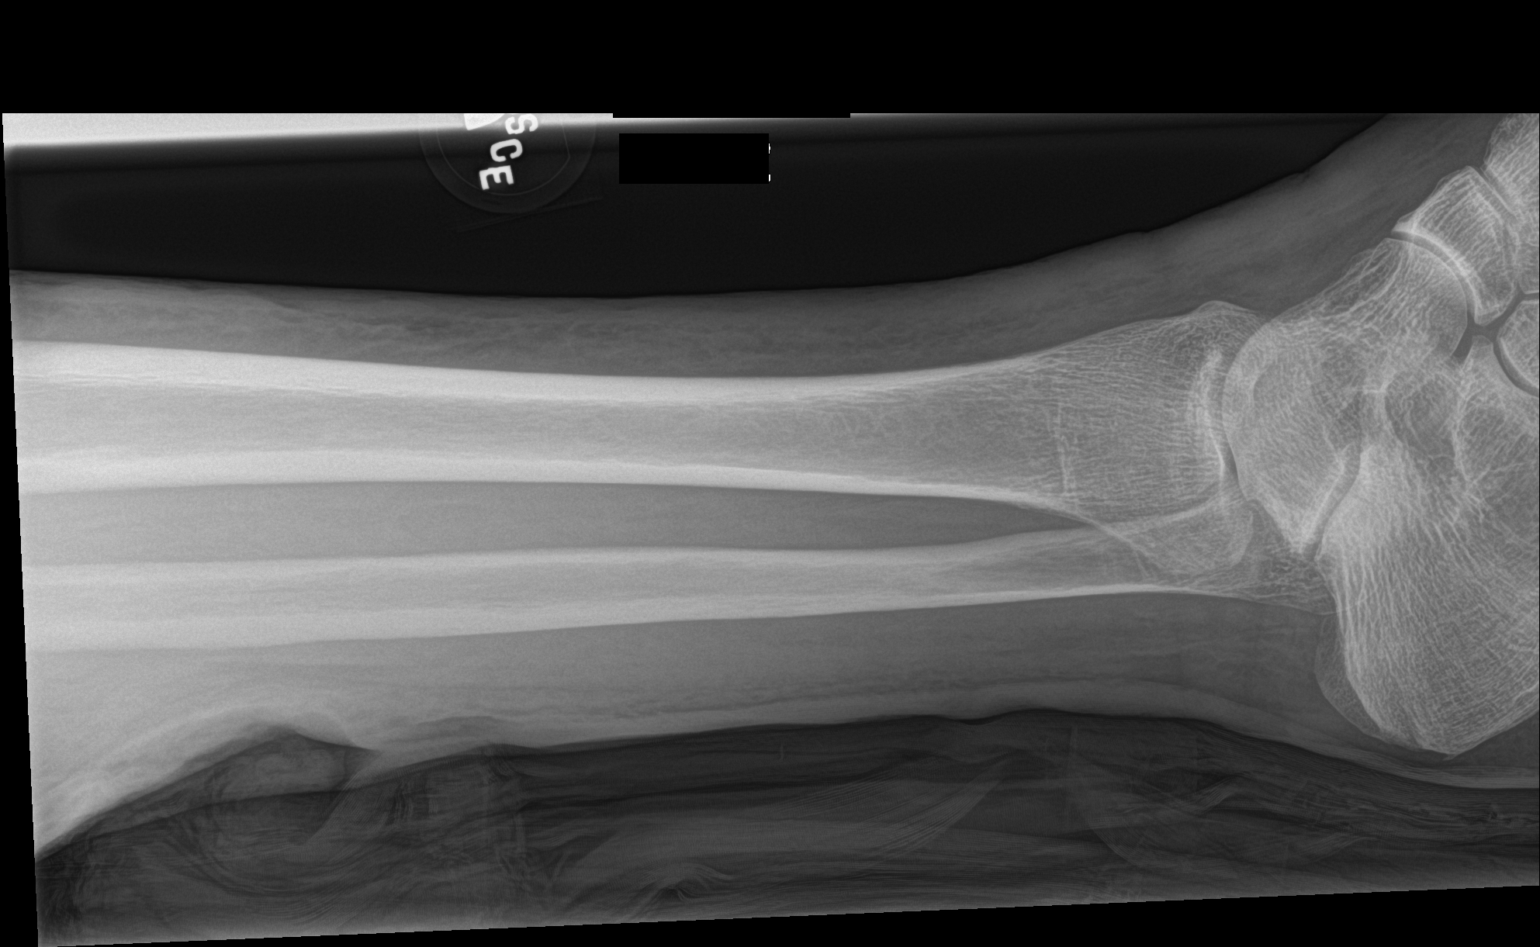

[3 of 3 positions shown; findings below may reference images not displayed]

FINDINGS: No osseous abnormality of the RIGHT tibia fibula. Joints appear
normal. No subcutaneous gas or foreign body
IMPRESSION: No acute osseous abnormality.

## 2016-09-20 IMAGING — CR DG CHEST 2V
2 series · 2 of 2 positions shown · non-contrast
Comparison: 11/18/2015

CLINICAL DATA: Lower extremity cellulitis and swelling. Shortness
of breath.

EXAM:
CHEST  2 VIEW

[chest lat]
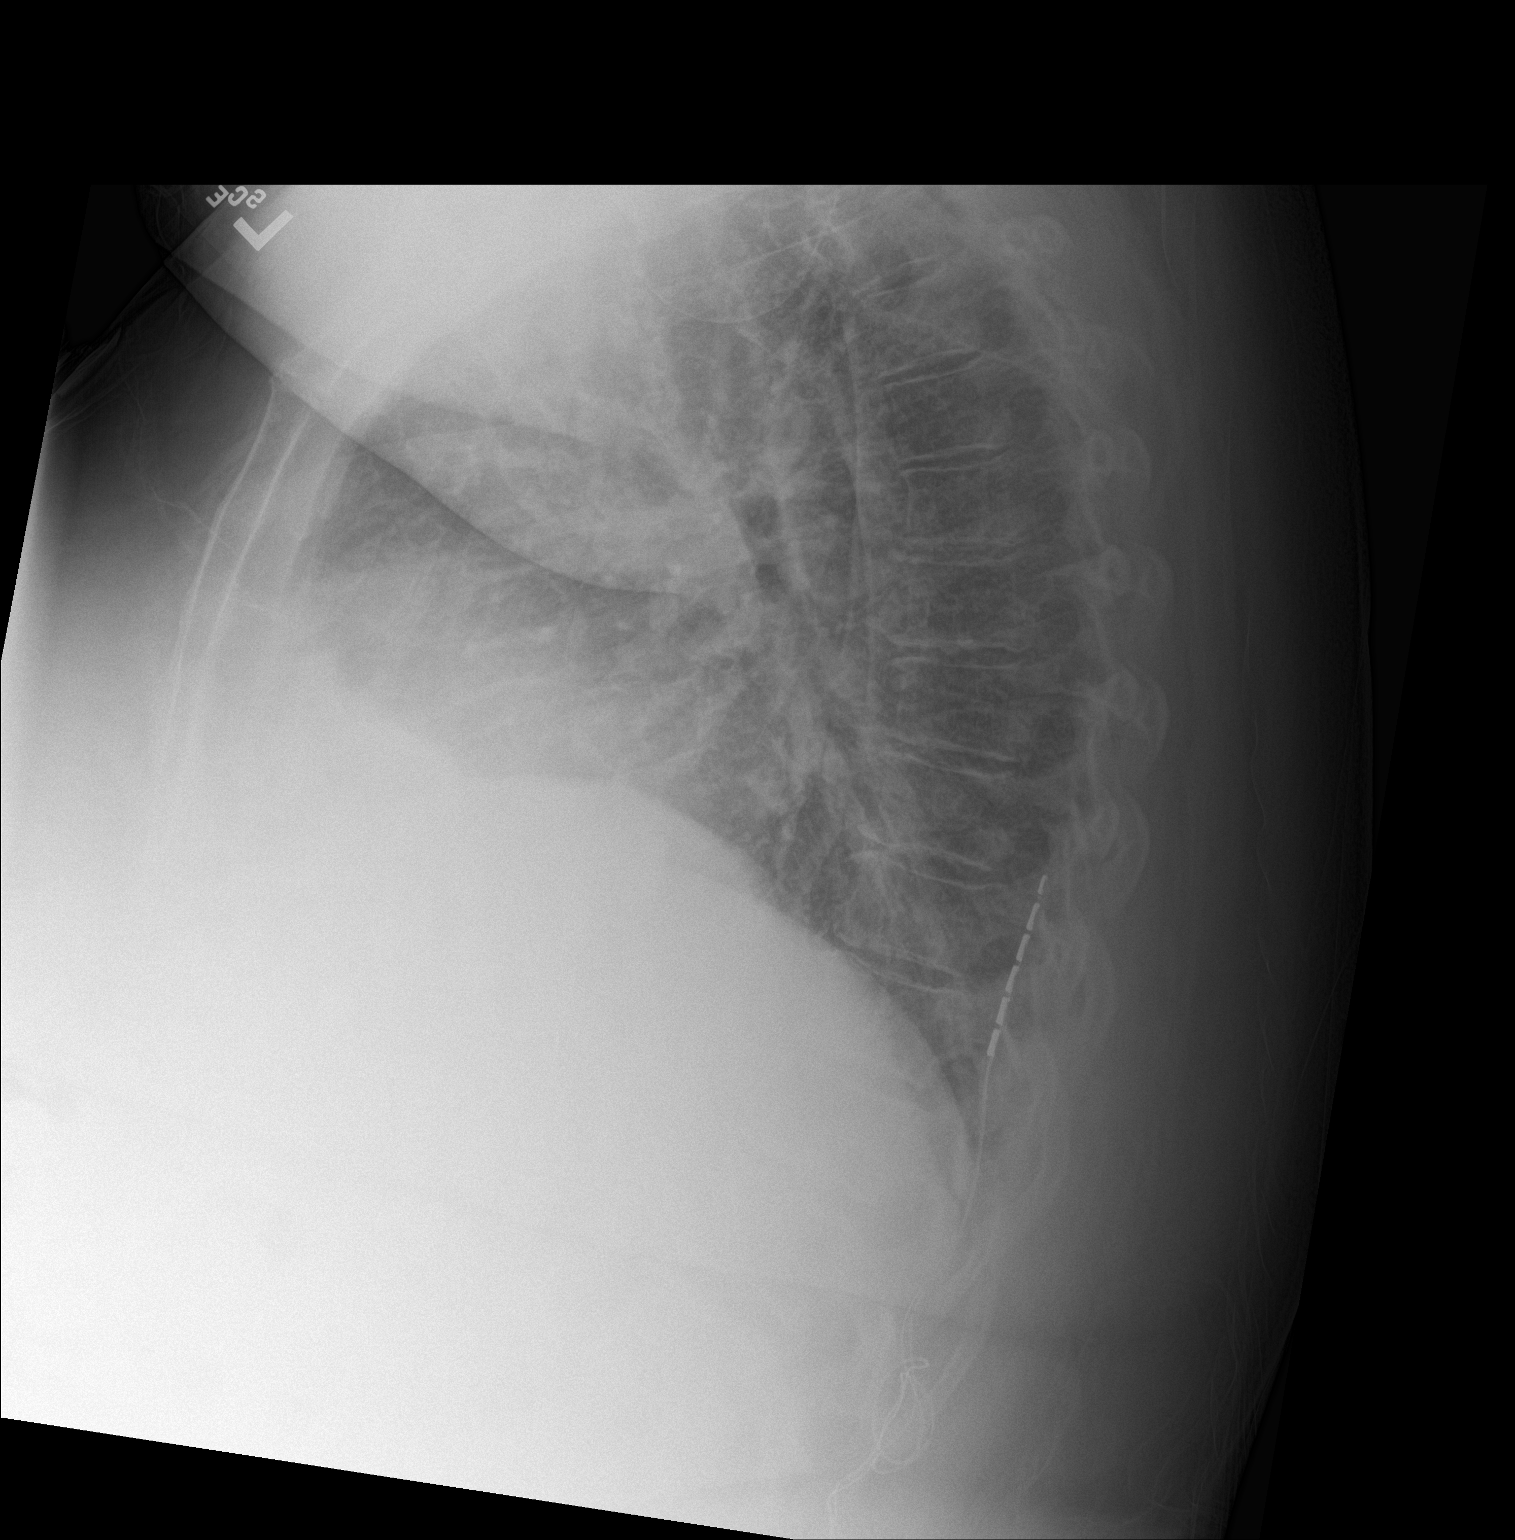

[chest ap]
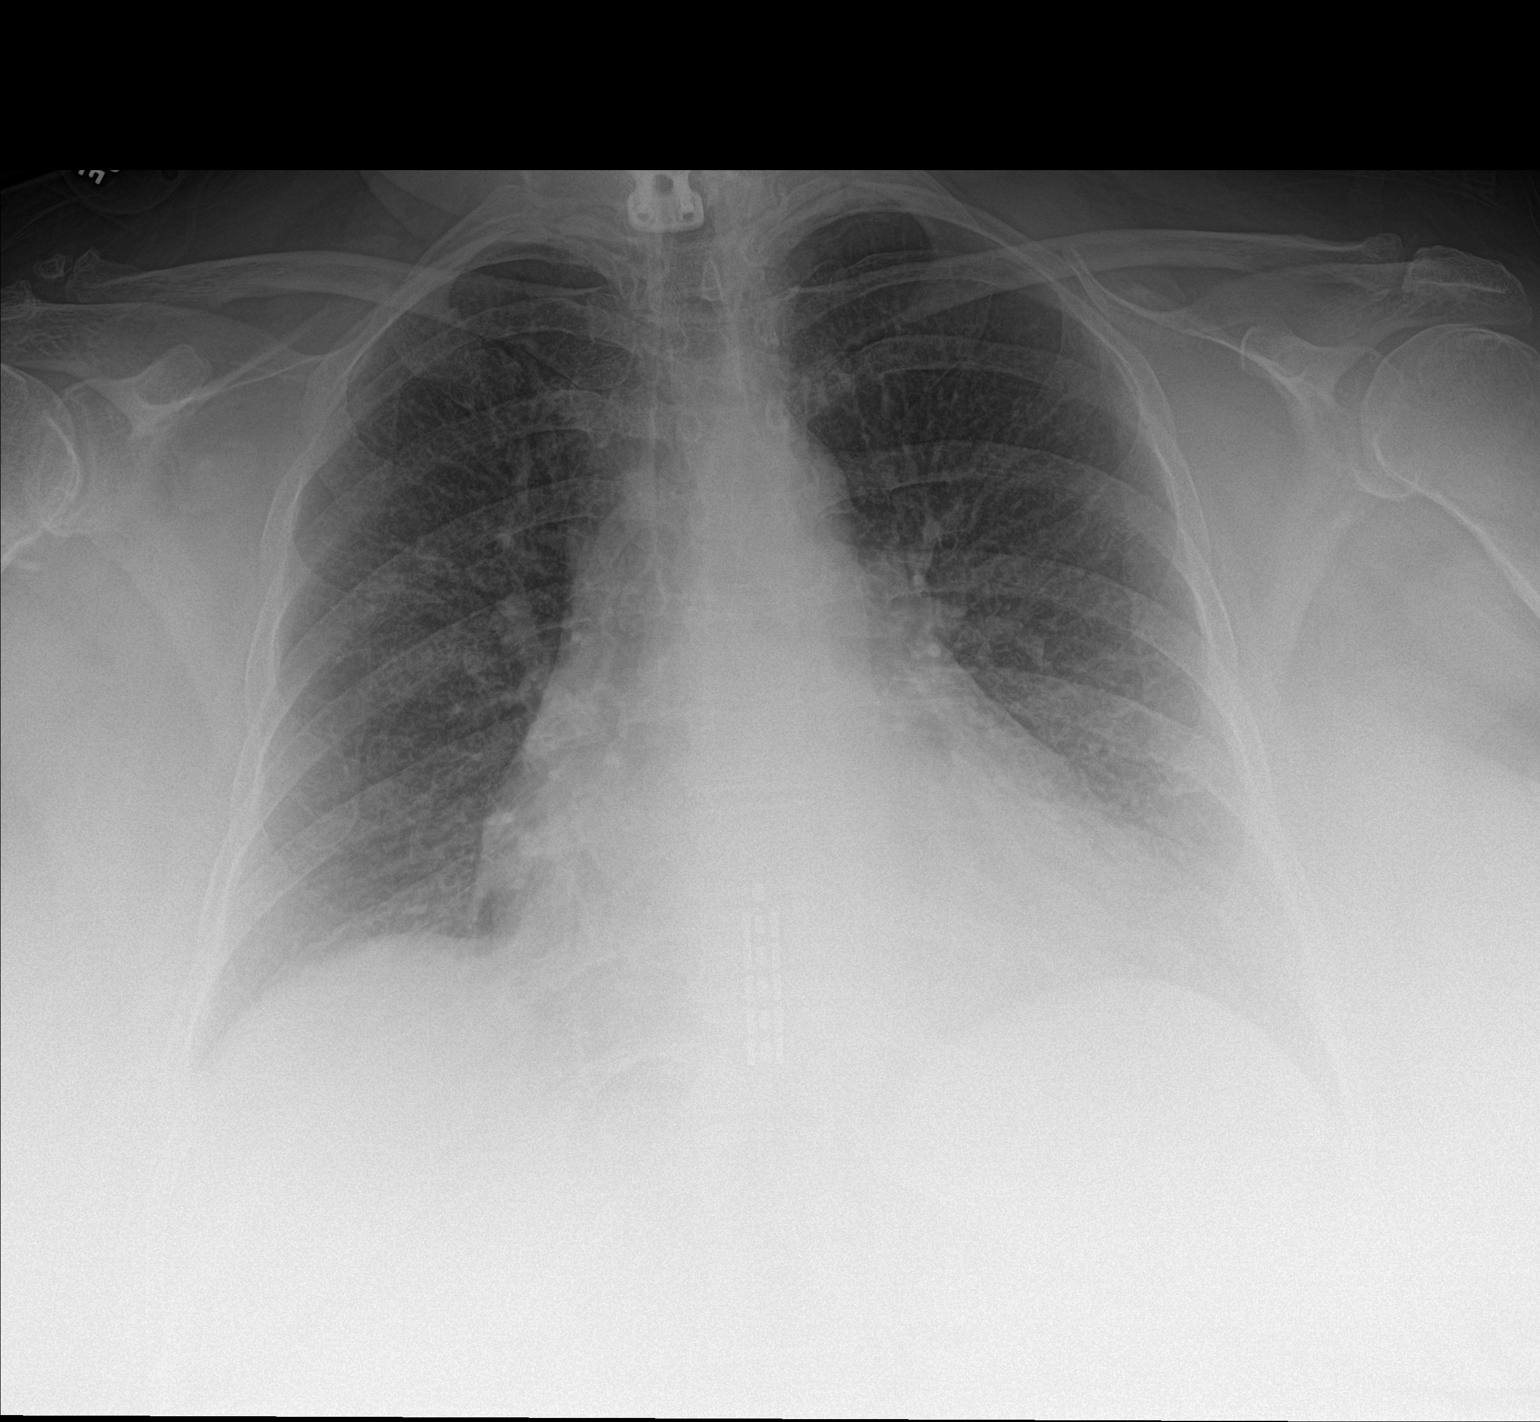

[2 of 2 positions shown; findings below may reference images not displayed]

FINDINGS: Cardiomegaly. No confluent airspace opacities or effusions. No acute
bony abnormality. Spinal stimulator wires noted in the lower
thoracic spine.
IMPRESSION: Cardiomegaly.  No active disease.

## 2016-09-20 IMAGING — CR DG TIBIA/FIBULA 2V*L*
2 series · 2 of 2 positions shown · non-contrast
Comparison: None

CLINICAL DATA: Swollen legs legs.

EXAM:
LEFT TIBIA AND FIBULA - 2 VIEW

[tibia ap]
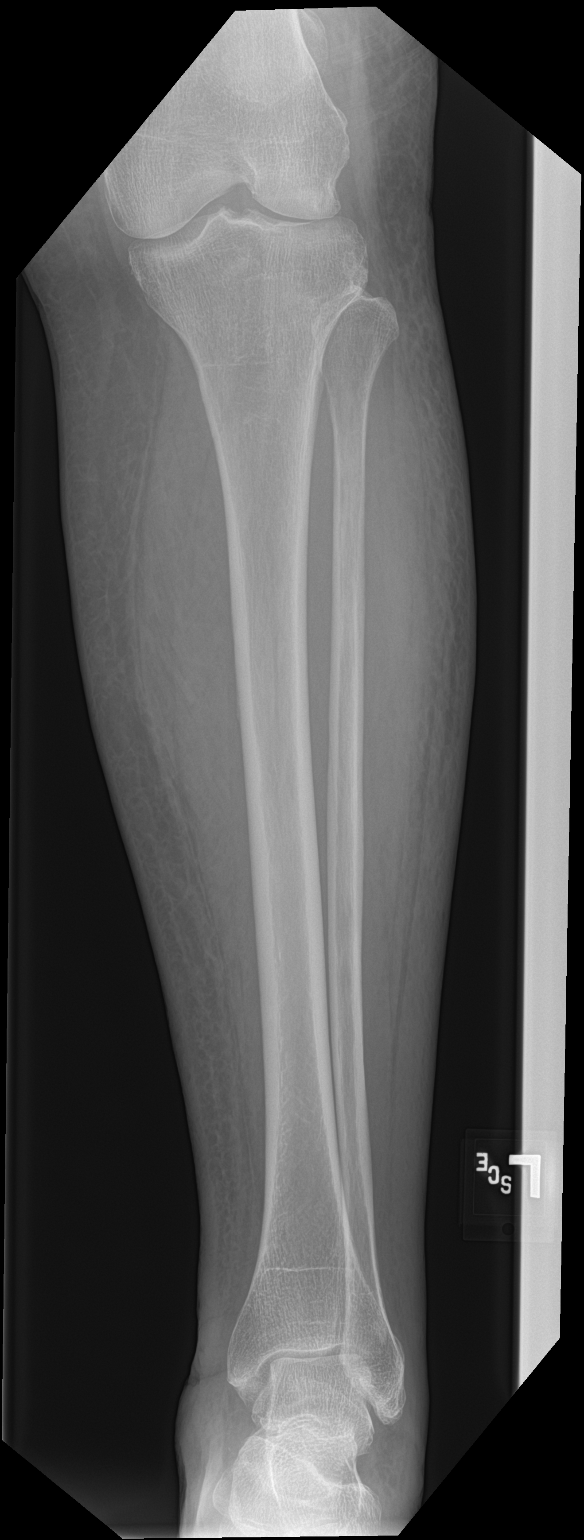

[tibia lat]
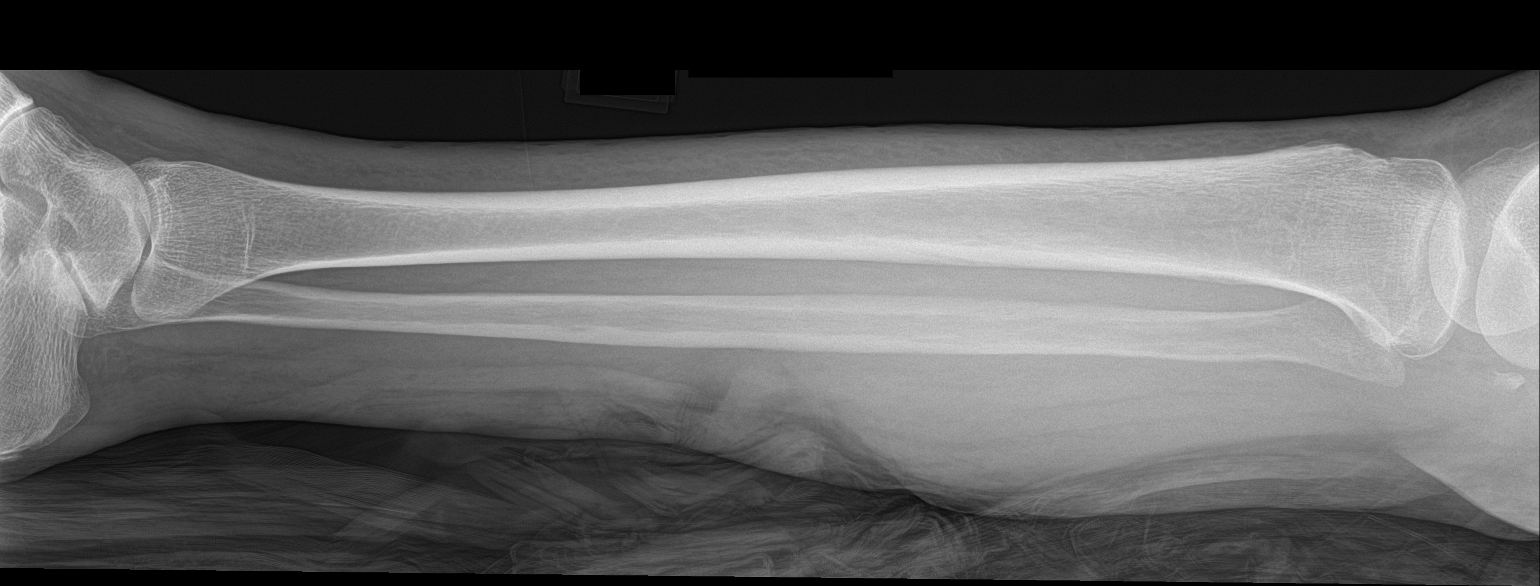

[2 of 2 positions shown; findings below may reference images not displayed]

FINDINGS: No osseous abnormality. Joints normal. No subcutaneous gas. No
foreign body.
IMPRESSION: No acute osseous abnormality.

## 2016-09-22 ENCOUNTER — Inpatient Hospital Stay: Payer: Medicare Other | Attending: Hematology and Oncology | Admitting: Hematology and Oncology

## 2016-09-22 VITALS — BP 122/71 | HR 88 | Temp 98.2°F | Wt 222.6 lb

## 2016-09-22 DIAGNOSIS — Z87891 Personal history of nicotine dependence: Secondary | ICD-10-CM | POA: Diagnosis not present

## 2016-09-22 DIAGNOSIS — M129 Arthropathy, unspecified: Secondary | ICD-10-CM | POA: Insufficient documentation

## 2016-09-22 DIAGNOSIS — E119 Type 2 diabetes mellitus without complications: Secondary | ICD-10-CM | POA: Diagnosis not present

## 2016-09-22 DIAGNOSIS — D696 Thrombocytopenia, unspecified: Secondary | ICD-10-CM | POA: Insufficient documentation

## 2016-09-22 DIAGNOSIS — K219 Gastro-esophageal reflux disease without esophagitis: Secondary | ICD-10-CM | POA: Insufficient documentation

## 2016-09-22 DIAGNOSIS — R161 Splenomegaly, not elsewhere classified: Secondary | ICD-10-CM | POA: Diagnosis not present

## 2016-09-22 DIAGNOSIS — E785 Hyperlipidemia, unspecified: Secondary | ICD-10-CM | POA: Diagnosis not present

## 2016-09-22 DIAGNOSIS — G4700 Insomnia, unspecified: Secondary | ICD-10-CM | POA: Diagnosis not present

## 2016-09-22 DIAGNOSIS — E669 Obesity, unspecified: Secondary | ICD-10-CM | POA: Diagnosis not present

## 2016-09-22 DIAGNOSIS — F329 Major depressive disorder, single episode, unspecified: Secondary | ICD-10-CM | POA: Diagnosis not present

## 2016-09-22 DIAGNOSIS — D6959 Other secondary thrombocytopenia: Secondary | ICD-10-CM | POA: Diagnosis not present

## 2016-09-22 DIAGNOSIS — M1990 Unspecified osteoarthritis, unspecified site: Secondary | ICD-10-CM | POA: Diagnosis not present

## 2016-09-22 DIAGNOSIS — R0602 Shortness of breath: Secondary | ICD-10-CM

## 2016-09-22 DIAGNOSIS — Z86010 Personal history of colonic polyps: Secondary | ICD-10-CM | POA: Diagnosis not present

## 2016-09-22 DIAGNOSIS — Z79899 Other long term (current) drug therapy: Secondary | ICD-10-CM | POA: Insufficient documentation

## 2016-09-22 DIAGNOSIS — Z794 Long term (current) use of insulin: Secondary | ICD-10-CM | POA: Insufficient documentation

## 2016-09-22 DIAGNOSIS — F319 Bipolar disorder, unspecified: Secondary | ICD-10-CM | POA: Diagnosis not present

## 2016-09-22 DIAGNOSIS — Z8673 Personal history of transient ischemic attack (TIA), and cerebral infarction without residual deficits: Secondary | ICD-10-CM | POA: Diagnosis not present

## 2016-09-22 DIAGNOSIS — I7100 Dissection of unspecified site of aorta: Secondary | ICD-10-CM | POA: Insufficient documentation

## 2016-09-22 DIAGNOSIS — R748 Abnormal levels of other serum enzymes: Secondary | ICD-10-CM | POA: Diagnosis not present

## 2016-09-22 DIAGNOSIS — K7460 Unspecified cirrhosis of liver: Secondary | ICD-10-CM | POA: Insufficient documentation

## 2016-09-22 DIAGNOSIS — G2581 Restless legs syndrome: Secondary | ICD-10-CM | POA: Diagnosis not present

## 2016-09-22 DIAGNOSIS — D509 Iron deficiency anemia, unspecified: Secondary | ICD-10-CM | POA: Diagnosis not present

## 2016-09-22 DIAGNOSIS — F419 Anxiety disorder, unspecified: Secondary | ICD-10-CM | POA: Diagnosis not present

## 2016-09-22 NOTE — Progress Notes (Signed)
Patient is needing to have knee surgery.  Wants to know if her platelets are high enough to schedule the surgery.

## 2016-09-22 NOTE — Progress Notes (Signed)
[facility] day:  09/22/2016  Chief Complaint: Stephanie [name] is a 66 y.o. female with chronic thrombocytopenia and iron deficiency anemia who is seen for 4 month assessment.  HPI: The patient was last seen in the medical oncology clinic on 06/02/2016.  At that time, she had issues with her right knee.  Arthroscopic surgery was planned.  Hematocrit was 35.7 and platelet count is 94,000.  Ferritin was 79.  She was to continue oral iron (ferritin goal 100).  She was admitted to [facility] from 07/19/2016 - 07/21/2016 secondary to encephalopathy secondary to hypoglycemia.  She states that knee surgery is planned by Dr. Roland Rack. She has osteoarthritis. She denies any bruising or bleeding.   Past Medical History:  Diagnosis Date  . Adenomatous colon polyp 08/27/2014   Polyps x 3  . Anemia   . Anxiety   . 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 Arthritis   . Asthma   . Bipolar affective disorder ([place])    h/o  . Blood transfusion 2000  . Blood transfusion without reported diagnosis   . Chronic abdominal pain    abdominal wall pain  . 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 Colon polyp   . Depression   . Diabetes mellitus without complication ([place])   . Fatty liver 04/09/08   found in abd CT  . GERD (gastroesophageal reflux disease)   . H/O: CVA (cardiovascular accident)    TIA  . H/O: rheumatic fever   . Hyperlipidemia   . Incontinence   . Insomnia   . Lower extremity edema   . Obstructive sleep apnea    not using CPAP, last sleep study 2.5 years ago, per pt doctor is aware  . Platelets decreased ([place])   . RLS (restless legs syndrome)   . Shortness of breath   . Sleep apnea   . Stroke [facility])    TIA- 2002  . Ulcer   . Unspecified disorders of nervous system     Past Surgical History:  Procedure Laterality Date  . ABDOMINAL EXPLORATION SURGERY    . ABDOMINAL HYSTERECTOMY     total  . Axillary artery cannulation via 8-mm Hemashield  graft, median sternotomy, extracorporeal circulation with deep hypothermic circulatory arrest, repair of  aortic dessection  01/23/2009   Dr Arlyce Dice  . BACK SURGERY     x 45  . cataract     both eyes  . CHOLECYSTECTOMY    . COLONOSCOPY    . EYE SURGERY     bilat  . HEMORROIDECTOMY     and colon polyp removed  . LIVER BIOPSY  04/10/2012   Procedure: LIVER BIOPSY;  Surgeon: Inda Castle, MD;  Location: [facility];  Service: Endoscopy;  Laterality: N/A;  ultrasound to mark order for abd limited/liver to be marked to be sent by [email or web address]   . LUMBAR FUSION  10/09  . LUMBAR WOUND DEBRIDEMENT  05/09/2011   Procedure: LUMBAR WOUND DEBRIDEMENT;  Surgeon: Dahlia Bailiff;  Location: [place];  Service: Orthopedics;  Laterality: N/A;  IRRIGATION AND DEBRIDEMENT SPINAL WOUND  . POSTERIOR CERVICAL FUSION/FORAMINOTOMY    . ROTATOR CUFF REPAIR     left    Family History  Problem Relation Age of Onset  . Breast cancer Mother   . Lung cancer Mother   . Stomach cancer Father   . Diabetes Unknown        4 aunts, and 1  uncle  . Colon cancer Neg Hx   . Esophageal cancer Neg Hx   . Rectal cancer Neg Hx     Social History:  reports that she quit smoking about 3 years ago. Her smoking use included Cigarettes. She has a 75.00 pack-year smoking history. She has never used smokeless tobacco. She reports that she does not drink alcohol or use drugs.  She is a resident at the [facility].  She is accompanied by Kieth Brightly from the [place], a good friend, today.  Allergies:  Allergies  Allergen Reactions  . Doxycycline Hives, Swelling and Other (See Comments)    Reaction:  Facial swelling     Current Medications: Current Outpatient Prescriptions  Medication Sig Dispense Refill  . acetaminophen (TYLENOL) 325 MG tablet Take 650 mg by mouth every 6 (six) hours as needed for moderate pain or fever.     Marland Kitchen acetaminophen (TYLENOL) 650 MG suppository Place 650 mg rectally every 6 (six) hours as  needed for moderate pain or fever.    Marland Kitchen albuterol (PROVENTIL HFA;VENTOLIN HFA) 108 (90 Base) MCG/ACT inhaler Inhale 2 puffs into the lungs every 6 (six) hours as needed for wheezing or shortness of breath. 1 Inhaler 2  . albuterol (PROVENTIL) (2.5 MG/3ML) 0.083% nebulizer solution Take 2.5 mg by nebulization every 6 (six) hours as needed for wheezing or shortness of breath.    . budesonide-formoterol (SYMBICORT) 160-4.5 MCG/ACT inhaler Inhale 2 puffs into the lungs 2 (two) times daily.    Marland Kitchen buPROPion (WELLBUTRIN SR) 150 MG 12 hr tablet Take 150 mg by mouth daily.    . clonazePAM (KLONOPIN) 0.5 MG tablet Take 0.25 mg by mouth 3 (three) times daily.     Marland Kitchen dicyclomine (BENTYL) 10 MG capsule Take 10 mg by mouth 3 (three) times daily.     . Emollient (EUCERIN INTENSIVE REPAIR) LOTN Apply 1 application topically 2 (two) times daily.    . ferrous sulfate 325 (65 FE) MG tablet Take 325 mg by mouth 2 (two) times daily.     . furosemide (LASIX) 80 MG tablet Take 80 mg by mouth daily.    Marland Kitchen gabapentin (NEURONTIN) 600 MG tablet Take 600 mg by mouth 2 (two) times daily.    . insulin lispro (HUMALOG) 100 UNIT/ML injection Inject 0-9 Units into the skin 3 (three) times daily before meals. Pt uses as needed per sliding scale:    150-200:  0 units  201-250:  5 units  251-300:  8 units 301-350:  9 units  Greater than 350:  Call MD    . lactulose (CHRONULAC) 10 GM/15ML solution Take 10 g by mouth 3 (three) times a week.     . lamoTRIgine (LAMICTAL) 100 MG tablet Take 100 mg by mouth 2 (two) times daily.    Marland Kitchen lidocaine (LIDODERM) 5 % Place 1 patch onto the skin daily. 15 patch 0  . loperamide (IMODIUM) 2 MG capsule Take 4 mg by mouth as needed for diarrhea or loose stools. *Do not exceed 8 doses in 24 hours*    . LORazepam (ATIVAN) 0.5 MG tablet Take 1 tablet (0.5 mg total) by mouth daily as needed for anxiety. 30 tablet 0  . metoCLOPramide (REGLAN) 5 MG tablet Take 5 mg by mouth 4 (four) times daily -  before  meals and at bedtime. Before meals and at bedtime    . mirtazapine (REMERON) 15 MG tablet Take 15 mg by mouth at bedtime.    Marland Kitchen omeprazole (PRILOSEC) 20  MG capsule Take 20 mg by mouth daily before breakfast.     . ondansetron (ZOFRAN) 4 MG tablet Take 4 mg by mouth every 6 (six) hours as needed for nausea or vomiting.    Marland Kitchen oxybutynin (DITROPAN-XL) 5 MG 24 hr tablet Take 5 mg by mouth daily.    . potassium chloride SA (K-DUR,KLOR-CON) 20 MEQ tablet Take 20 mEq by mouth daily. *Note dose*    . pregabalin (LYRICA) 150 MG capsule Take 150 mg by mouth 3 (three) times daily.     Marland Kitchen rOPINIRole (REQUIP) 3 MG tablet Take 3 mg by mouth at bedtime.    . simvastatin (ZOCOR) 10 MG tablet Take 10 mg by mouth at bedtime.     Marland Kitchen spironolactone (ALDACTONE) 25 MG tablet Take 25 mg by mouth daily.    . traMADol (ULTRAM) 50 MG tablet Take 50 mg by mouth 2 (two) times daily as needed for moderate pain.     . vitamin C (ASCORBIC ACID) 500 MG tablet Take 500 mg by mouth 2 (two) times daily.    Marland Kitchen zolpidem (AMBIEN) 5 MG tablet Take 5 mg by mouth at bedtime.     . diclofenac sodium (VOLTAREN) 1 % GEL Apply 2 g topically every 8 (eight) hours as needed.    Marland Kitchen guaifenesin (ROBITUSSIN) 100 MG/5ML syrup Take 200 mg by mouth 2 (two) times daily as needed for cough.    . menthol-cetylpyridinium (CEPACOL) 3 MG lozenge Take 1 lozenge by mouth as needed for sore throat.    . nystatin cream (MYCOSTATIN) Apply 1 application topically 2 (two) times daily.    Marland Kitchen oxyCODONE (OXY IR/ROXICODONE) 5 MG immediate release tablet Take 1 tablet (5 mg total) by mouth every 12 (twelve) hours. (Patient not taking: Reported on 09/22/2016) 30 tablet 0  . triamcinolone cream (KENALOG) 0.1 % Apply 1 application topically 2 (two) times daily.     No current facility-administered medications for this visit.     Review of Systems:  GENERAL:  Feels "ok".  No fevers or sweats.  Weight up 4 pounds. PERFORMANCE STATUS (ECOG):  2 HEENT:  No visual changes,  runny nose, sore throat, mouth sores or tenderness. Lungs:  Shortness of breath with exertion.  No cough.  No hemoptysis. Cardiac:  No chest pain, palpitations, orthopnea, or PND. GI: Eating well.  Black stool secondary to iron.  No nausea, vomiting, diarrhea, constipation, or hematochezia. GU:  No urgency, frequency, dysuria, or hematuria. Musculoskeletal:  Right knee arthritis (surgery planned).  No back pain.  No muscle tenderness. Extremities:  No pain or swelling. Skin:  No rashes or ulcers. Neuro:  Tingling in feet.  No focal weakness.  No seizures.  No headache, numbness or weakness, balance or coordination issues. Endocrine:  Diabetes.  No thyroid issues, hot flashes or night sweats. Psych:  No mood changes, depression or anxiety. Pain:  No focal pain. Review of systems:  All other systems reviewed and found to be negative.  Physical Exam: Blood pressure 122/71, pulse 88, temperature 98.2 F (36.8 C), temperature source Tympanic, weight 222 lb 9 oz (101 kg). GENERAL:  Well developed, well nourished, heavyset woman sitting comfortably in the exam room in no acute distress.  She requires a 2 person assist for her to get onto the exam table. MENTAL STATUS:  Alert and oriented to person, place and time. HEAD:  Short gray hair.  Normocephalic, atraumatic, face symmetric, no Cushingoid features. EYES:  Blue eyes s/p cataract surgery.  Pupils equal round  and reactive to light and accomodation.  No conjunctivitis or scleral icterus. ENT:  Oropharynx clear without lesion.  Dentures.  Tongue normal. Mucous membranes moist.  RESPIRATORY:  Clear to auscultation without rales, wheezes or rhonchi. CARDIOVASCULAR:  Regular rate and rhythm without murmur, rub or gallop. ABDOMEN:  Soft, fully round, with active bowel sounds, and no appreciable hepatosplenomegaly.  No guarding or rebound tenderness.  No masses. SKIN: No rashes, ulcers.  No ecchymosis or petechiae. EXTREMITIES:  Chronic bilateral  lower extremity edema.  No palpable cords. LYMPH NODES: No palpable cervical, supraclavicular, axillary or inguinal adenopathy  NEUROLOGICAL: Unremarkable. PSYCH:  Appropriate.   [facility] Labs: Labs on 09/19/2016 revealed a hematocrit 39.0, hemoglobin 12.6, MCV 94, platelets are 10,000, white count 9000 with an ANC of 7200. Comprehensive metabolic panel included an alkaline phosphatase of 213. Bilirubin, SGOT and SGPT were normal.  Creatinine was normal. Iron studies included a ferritin of 136, iron saturation of 28% and a TIBC of 310.   No visits with results within 3 Day(s) from this visit.  Latest known visit with results is:  Admission on 07/28/2016, Discharged on 07/28/2016  Component Date Value Ref Range Status  . Sodium 07/28/2016 140  135 - 145 mmol/L Final  . Potassium 07/28/2016 4.0  3.5 - 5.1 mmol/L Final  . Chloride 07/28/2016 108  101 - 111 mmol/L Final  . CO2 07/28/2016 28  22 - 32 mmol/L Final  . Glucose, Bld 07/28/2016 84  65 - 99 mg/dL Final  . BUN 07/28/2016 17  6 - 20 mg/dL Final  . Creatinine, Ser 07/28/2016 0.84  0.44 - 1.00 mg/dL Final  . Calcium 07/28/2016 9.2  8.9 - 10.3 mg/dL Final  . GFR calc non Af Amer 07/28/2016 >60  >60 mL/min Final  . GFR calc Af Amer 07/28/2016 >60  >60 mL/min Final   Comment: (NOTE) The eGFR has been calculated using the CKD EPI equation. This calculation has not been validated in all clinical situations. eGFR's persistently <60 mL/min signify possible Chronic Kidney Disease.   . Anion gap 07/28/2016 4* 5 - 15 Final  . WBC 07/28/2016 5.5  3.6 - 11.0 K/uL Final  . RBC 07/28/2016 3.81  3.80 - 5.20 MIL/uL Final  . Hemoglobin 07/28/2016 12.1  12.0 - 16.0 g/dL Final  . HCT 07/28/2016 35.4  35.0 - 47.0 % Final  . MCV 07/28/2016 92.9  80.0 - 100.0 fL Final  . MCH 07/28/2016 31.7  26.0 - 34.0 pg Final  . MCHC 07/28/2016 34.1  32.0 - 36.0 g/dL Final  . RDW 07/28/2016 16.4* 11.5 - 14.5 % Final  . Platelets 07/28/2016 88* 150 - 440 K/uL  Final  . Total Protein 07/28/2016 7.0  6.5 - 8.1 g/dL Final  . Albumin 07/28/2016 3.5  3.5 - 5.0 g/dL Final  . AST 07/28/2016 39  15 - 41 U/L Final  . ALT 07/28/2016 29  14 - 54 U/L Final  . Alkaline Phosphatase 07/28/2016 130* 38 - 126 U/L Final  . Total Bilirubin 07/28/2016 0.9  0.3 - 1.2 mg/dL Final  . Bilirubin, Direct 07/28/2016 0.1  0.1 - 0.5 mg/dL Final  . Indirect Bilirubin 07/28/2016 0.8  0.3 - 0.9 mg/dL Final  . B Natriuretic Peptide 07/28/2016 108.0* 0.0 - 100.0 pg/mL Final  . Influenza A By PCR 07/28/2016 NEGATIVE  NEGATIVE Final  . Influenza B By PCR 07/28/2016 NEGATIVE  NEGATIVE Final   Comment: (NOTE) The Xpert Xpress Flu assay is intended as an aid in the  diagnosis of  influenza and should not be used as a sole basis for treatment.  This  assay is FDA approved for nasopharyngeal swab specimens only. Nasal  washings and aspirates are unacceptable for Xpert Xpress Flu testing.     Assessment:  JENAVEVE FENSTERMAKER is a 66 y.o. 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 Platelets have ranged between 108,000 and 128,000 without trend. She denies any new medications or herbal products.  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  Work-up on 08/29/2014 revealed a positive rheumatoid factor and ANA (anti-double-stranded DNA of 5- equivocal).  Negative studies included hepatitis B surface antibody and antigen, hepatitis C testing, and HIV testing. B12, SPEP, and free light chains were normal.  Additional testing on 10/31/2014 revealed negative hepatitis B core antibody and CMV IgM.  Abdominal ultrasound on 06/27/2014 revealed mild splenomegaly (14.2 cm) with moderate cirrhosis and no focal liver lesions. AFP was normal (4) on 04/21/2015.  She denies any alcohol use.  She has a history of recurrent iron deficiency anemia.  Ferritin was 20 on 05/11/2015 and 15 on 11/13/2015. Hematocrit was 29.7 on  11/13/2015.  Last colonoscopy was in 08/2014.  Guaiac cards were negative x 3 in 11/2015.  Diet is good.  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  Symptomatically, she has is planning arthroscopic surgery on her right knee.  Hematocrit is 39.0 and platelet count is 110,000.  Ferritin has improved from 15 to 136.  Plan: 1.  Review [facility] labs.  Discuss continued improvement in hematocrit and iron stores. Discuss increased alkaline phosphatase. Etiology is unclear (bone versus liver).  Discuss fractionation.  Discuss need for liver imaging. 2.  No Venofer today. 3.  Discontinue ferrous sulfate. 4.  RUQ ultrasound- assess liver 5.  [facility] slips (CBC with diff, CMP, ferritin, iron studies, AFP, fractionated alk phosphatase) prior to next appt. 6.  RTC in 3 months for MD assessment and review of [facility] labs.  Addendum:  Liver ultrasound on 09/29/2016 revealed a nodular liver (cirrhotic) without focal mass.  She is s/p cholecystectomy.   Lequita Asal, MD   09/22/2016

## 2016-09-28 ENCOUNTER — Emergency Department
Admission: EM | Admit: 2016-09-28 | Discharge: 2016-09-28 | Disposition: A | Discharge status: Home / Self Care | Payer: Medicare Other | Attending: Emergency Medicine | Admitting: Emergency Medicine

## 2016-09-28 ENCOUNTER — Emergency Department: Payer: Medicare Other

## 2016-09-28 ENCOUNTER — Encounter: Payer: Self-pay | Admitting: Emergency Medicine

## 2016-09-28 DIAGNOSIS — Z794 Long term (current) use of insulin: Secondary | ICD-10-CM | POA: Diagnosis not present

## 2016-09-28 DIAGNOSIS — E119 Type 2 diabetes mellitus without complications: Secondary | ICD-10-CM | POA: Insufficient documentation

## 2016-09-28 DIAGNOSIS — Z79899 Other long term (current) drug therapy: Secondary | ICD-10-CM | POA: Insufficient documentation

## 2016-09-28 DIAGNOSIS — R079 Chest pain, unspecified: Secondary | ICD-10-CM | POA: Insufficient documentation

## 2016-09-28 DIAGNOSIS — R0789 Other chest pain: Secondary | ICD-10-CM | POA: Diagnosis not present

## 2016-09-28 DIAGNOSIS — J45909 Unspecified asthma, uncomplicated: Secondary | ICD-10-CM | POA: Insufficient documentation

## 2016-09-28 DIAGNOSIS — R002 Palpitations: Secondary | ICD-10-CM | POA: Diagnosis not present

## 2016-09-28 DIAGNOSIS — Z87891 Personal history of nicotine dependence: Secondary | ICD-10-CM | POA: Insufficient documentation

## 2016-09-28 LAB — BASIC METABOLIC PANEL
Anion gap: 7 (ref 5–15)
BUN: 15 mg/dL (ref 6–20)
CHLORIDE: 100 mmol/L — AB (ref 101–111)
CO2: 29 mmol/L (ref 22–32)
Calcium: 9 mg/dL (ref 8.9–10.3)
Creatinine, Ser: 0.73 mg/dL (ref 0.44–1.00)
Glucose, Bld: 222 mg/dL — ABNORMAL HIGH (ref 65–99)
POTASSIUM: 3.7 mmol/L (ref 3.5–5.1)
SODIUM: 136 mmol/L (ref 135–145)

## 2016-09-28 LAB — CBC
HEMATOCRIT: 37 % (ref 35.0–47.0)
Hemoglobin: 12.4 g/dL (ref 12.0–16.0)
MCH: 31.3 pg (ref 26.0–34.0)
MCHC: 33.6 g/dL (ref 32.0–36.0)
MCV: 93.3 fL (ref 80.0–100.0)
PLATELETS: 101 10*3/uL — AB (ref 150–440)
RBC: 3.97 MIL/uL (ref 3.80–5.20)
RDW: 16.3 % — ABNORMAL HIGH (ref 11.5–14.5)
WBC: 6.1 10*3/uL (ref 3.6–11.0)

## 2016-09-28 LAB — TROPONIN I: Troponin I: <0.03 ng/mL (ref <0.03)

## 2016-09-28 MED ORDER — MORPHINE SULFATE (PF) 4 MG/ML IV SOLN
4.0000 mg | Freq: Once | INTRAVENOUS | Status: AC
  Administered 2016-09-28: 4 mg via INTRAVENOUS
  Filled 2016-09-28: qty 1

## 2016-09-28 NOTE — ED Provider Notes (Signed)
[facility] Emergency Department Provider Note       Time seen: ----------------------------------------- 10:11 AM on 09/28/2016 -----------------------------------------     I have reviewed the triage vital signs and the nursing notes.   HISTORY   Chief Complaint Chest Pain    HPI AJAI TERHAAR is a 66 y.o. female who presents to the ED for constant chest pain started 30 minutes prior to arrival. Patient states pain is in the center of her chest and some on the right side of her chest that radiates into her neck with numbness in the right side of her neck. Patient states the pain is worse with palpation and movement. She denies a history as before, nothing makes it better or worse. She states she has not had a heart attack in the past, has not seen a cardiologist.   Past Medical History:  Diagnosis Date  . Adenomatous colon polyp 08/27/2014   Polyps x 3  . Anemia   . Anxiety   . 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 Arthritis   . Asthma   . Bipolar affective disorder ([place])    h/o  . Blood transfusion 2000  . Blood transfusion without reported diagnosis   . Chronic abdominal pain    abdominal wall pain  . 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 Colon polyp   . Depression   . Diabetes mellitus without complication ([place])   . Fatty liver 04/09/08   found in abd CT  . GERD (gastroesophageal reflux disease)   . H/O: CVA (cardiovascular accident)    TIA  . H/O: rheumatic fever   . Hyperlipidemia   . Incontinence   . Insomnia   . Lower extremity edema   . Obstructive sleep apnea    not using CPAP, last sleep study 2.5 years ago, per pt doctor is aware  . Platelets decreased ([place])   . RLS (restless legs syndrome)   . Shortness of breath   . Sleep apnea   . Stroke [facility])    TIA- 2002  . Ulcer   . Unspecified disorders of nervous system     Patient Active Problem List   Diagnosis Date Noted  . Encephalopathy 07/19/2016  . Lymphedema  06/14/2016  . Pain in limb 05/06/2016  . Swelling of limb 05/06/2016  . Postphlebitic syndrome with inflammation 05/06/2016  . Pneumonia 04/18/2016  . Chest pain 04/04/2016  . Iron deficiency anemia 01/30/2016  . Hypoglycemia 09/09/2015  . Hyperglycemia 07/16/2015  . Neuropathy 06/08/2015  . Thrombocytopenia ([place]) 08/19/2014  . Abdominal pain, chronic, epigastric 06/12/2014  . Liver cirrhosis secondary to NASH ([place]) 02/27/2014  . Dizziness and giddiness 10/30/2013  . DOE (dyspnea on exertion) 10/30/2013  . Poorly controlled type 2 diabetes mellitus with complication ([place]) 40/34/7425  . DNR (do not resuscitate) 07/29/2013  . Encounter for routine gynecological examination 07/29/2013  . Dyspnea 07/10/2013  . Lung nodule 07/10/2013  . Varicose veins of left lower extremity 10/09/2012  . GERD (gastroesophageal reflux disease) 10/09/2012  . Cirrhosis of liver ([place]) 02/02/2012  . Anxiety 07/11/2011  . Aortic dissection ([place])   . BACK PAIN, LUMBAR, CHRONIC 11/19/2009  . UNS ADVRS EFF OTH RX MEDICINAL&BIOLOGICAL SBSTNC 12/16/2008  . UNSPECIFIED VITAMIN D DEFICIENCY 09/26/2008  . TOBACCO ABUSE 06/19/2008  . RESTLESS LEGS SYNDROME 04/27/2007  . INSOMNIA 04/27/2007  . HLD (hyperlipidemia) 03/08/2007  . OSA (obstructive sleep apnea) 03/08/2007  . Essential hypertension 03/08/2007  . Asthma 03/08/2007  .  BIPOLAR AFFECTIVE DISORDER, HX OF 03/08/2007    Past Surgical History:  Procedure Laterality Date  . ABDOMINAL EXPLORATION SURGERY    . ABDOMINAL HYSTERECTOMY     total  . Axillary artery cannulation via 8-mm Hemashield graft, median sternotomy, extracorporeal circulation with deep hypothermic circulatory arrest, repair of  aortic dessection  01/23/2009   Dr Arlyce Dice  . BACK SURGERY     x 45  . cataract     both eyes  . CHOLECYSTECTOMY    . COLONOSCOPY    . EYE SURGERY     bilat  . HEMORROIDECTOMY     and colon polyp removed  . LIVER BIOPSY  04/10/2012   Procedure: LIVER  BIOPSY;  Surgeon: Inda Castle, MD;  [facility];  Service: Endoscopy;  Laterality: N/A;  ultrasound to mark order for abd limited/liver to be marked to be sent by [email or web address]   . LUMBAR FUSION  10/09  . LUMBAR WOUND DEBRIDEMENT  05/09/2011   Procedure: LUMBAR WOUND DEBRIDEMENT;  Surgeon: Dahlia Bailiff;  Location: [place];  Service: Orthopedics;  Laterality: N/A;  IRRIGATION AND DEBRIDEMENT SPINAL WOUND  . POSTERIOR CERVICAL FUSION/FORAMINOTOMY    . ROTATOR CUFF REPAIR     left    Allergies Morphine and related and Doxycycline  Social History Social History  Substance Use Topics  . Smoking status: Former Smoker    Packs/day: 1.50    Years: 50.00    Types: Cigarettes    Quit date: 04/06/2013  . Smokeless tobacco: Never Used  . Alcohol use No    Review of Systems Constitutional: Negative for fever. Eyes: Negative for vision changes ENT:  Negative for congestion, sore throat Cardiovascular: Positive for chest pain Respiratory: Negative for shortness of breath. Gastrointestinal: Negative for abdominal pain, positive for nausea Genitourinary: Negative for dysuria. Musculoskeletal: Negative for back pain. Skin: Negative for rash. Neurological: Negative for headaches, focal weakness or numbness.  All other systems are reviewed and otherwise negative  ____________________________________________   PHYSICAL EXAM:  VITAL SIGNS: ED Triage Vitals  Enc Vitals Group     BP 09/28/16 1002 111/74     Pulse Rate 09/28/16 1002 81     Resp 09/28/16 1002 18     Temp 09/28/16 1002 97.7 F (36.5 C)     Temp Source 09/28/16 1002 Oral     SpO2 09/28/16 1002 100 %     Weight 09/28/16 1003 220 lb (99.8 kg)     Height 09/28/16 1003 5' 3"  (1.6 m)     Head Circumference --      Peak Flow --      Pain Score 09/28/16 1002 8     Pain Loc --      Pain Edu? --      Excl. in [place]? --     Constitutional: Alert and oriented. Tearful, mild distress Eyes: Conjunctivae are normal.  PERRL. Normal extraocular movements. ENT   Head: Normocephalic and atraumatic.   Nose: No congestion/rhinnorhea.   Mouth/Throat: Mucous membranes are moist.   Neck: No stridor. Cardiovascular: Normal rate, regular rhythm. No murmurs, rubs, or gallops. Respiratory: Normal respiratory effort without tachypnea nor retractions. Breath sounds are clear and equal bilaterally. No wheezes/rales/rhonchi. Gastrointestinal: Soft and nontender. Normal bowel sounds Musculoskeletal: Nontender with normal range of motion in extremities. No lower extremity tenderness nor edema. Neurologic:  Normal speech and language. No gross focal neurologic deficits are appreciated.  Skin:  Skin is warm, dry and intact. No rash noted. Psychiatric: Mood and  affect are normal. Speech and behavior are normal.  ____________________________________________  EKG: Interpreted by me. Sinus rhythm with rate 80 bpm, normal PR interval, normal QRS, normal QT, normal axis.  ____________________________________________  ED COURSE:  Pertinent labs & imaging results that were available during my care of the patient were reviewed by me and considered in my medical decision making (see chart for details). Patient presents for chest pain, we will assess with labs and imaging as indicated.   Procedures ____________________________________________   LABS (pertinent positives/negatives)  Labs Reviewed  BASIC METABOLIC PANEL - Abnormal; Notable for the following:       Result Value   Chloride 100 (*)    Glucose, Bld 222 (*)    All other components within normal limits  CBC - Abnormal; Notable for the following:    RDW 16.3 (*)    Platelets 101 (*)    All other components within normal limits  TROPONIN I  TROPONIN I    RADIOLOGY Images were viewed by me  Chest x-ray IMPRESSION: No active cardiopulmonary disease.  Stable cardiomegaly. ____________________________________________  FINAL ASSESSMENT AND  PLAN  Chest pain  Plan: Patient's labs and imaging were dictated above. Patient had presented for chest pain of uncertain etiology. Troponin was negative, her workup is reassuring this is likely chest wall pain related. She is stable for outpatient follow-up.   Earleen Newport, MD   Note: This note was generated in part or whole with voice recognition software. Voice recognition is usually quite accurate but there are transcription errors that can and very often do occur. I apologize for any typographical errors that were not detected and corrected.     Earleen Newport, MD 09/28/16 [phone]

## 2016-09-28 NOTE — ED Notes (Signed)
Helped pt to bathroom and hooked her up to monitor.

## 2016-09-28 NOTE — ED Triage Notes (Signed)
Pt to ed via ems from home with c/o constant chest pain that started about 30 min to 1 hour pta.  Pt states pain is in center of chest and radiates into right side of chest pain with numbness in neck.  Pt alert and oriented. States pain is worse with palpation and movement.

## 2016-09-28 NOTE — ED Notes (Signed)
Pt to radiology via stretcher.  

## 2016-09-29 ENCOUNTER — Ambulatory Visit
Admission: RE | Admit: 2016-09-29 | Discharge: 2016-09-29 | Disposition: A | Discharge status: Home / Self Care | Payer: Medicare Other | Source: Ambulatory Visit | Attending: Hematology and Oncology | Admitting: Hematology and Oncology

## 2016-09-29 DIAGNOSIS — D696 Thrombocytopenia, unspecified: Secondary | ICD-10-CM | POA: Diagnosis not present

## 2016-09-29 DIAGNOSIS — M25551 Pain in right hip: Secondary | ICD-10-CM | POA: Diagnosis not present

## 2016-09-29 DIAGNOSIS — D509 Iron deficiency anemia, unspecified: Secondary | ICD-10-CM | POA: Diagnosis not present

## 2016-09-29 DIAGNOSIS — K7460 Unspecified cirrhosis of liver: Secondary | ICD-10-CM | POA: Diagnosis not present

## 2016-09-29 DIAGNOSIS — M25552 Pain in left hip: Secondary | ICD-10-CM | POA: Diagnosis not present

## 2016-09-29 DIAGNOSIS — I10 Essential (primary) hypertension: Secondary | ICD-10-CM | POA: Diagnosis not present

## 2016-10-03 ENCOUNTER — Ambulatory Visit: Payer: Medicare Other | Admitting: Hematology and Oncology

## 2016-10-15 ENCOUNTER — Encounter: Payer: Self-pay | Admitting: Hematology and Oncology

## 2016-10-19 ENCOUNTER — Telehealth: Payer: Self-pay | Admitting: *Deleted

## 2016-10-19 NOTE — Telephone Encounter (Signed)
Called patient per Dr. Mike Gip request, pt reports having knee surgery on 6-1 and will have labs drawn per MD orders, [facility] slip mailed to patient, voiced understanding.

## 2016-10-28 IMAGING — CR DG KNEE COMPLETE 4+V*R*
1 series · 4 of 4 positions shown · non-contrast
Comparison: 06/23/2015

CLINICAL DATA: Cellulitis and increasing swelling to the lower
extremities.

EXAM:
RIGHT KNEE - COMPLETE 4+ VIEW

[Series 1: dg knee complete 4 views right · 0.14mm/px · 4 of 4 slices shown]
[im 1/4]
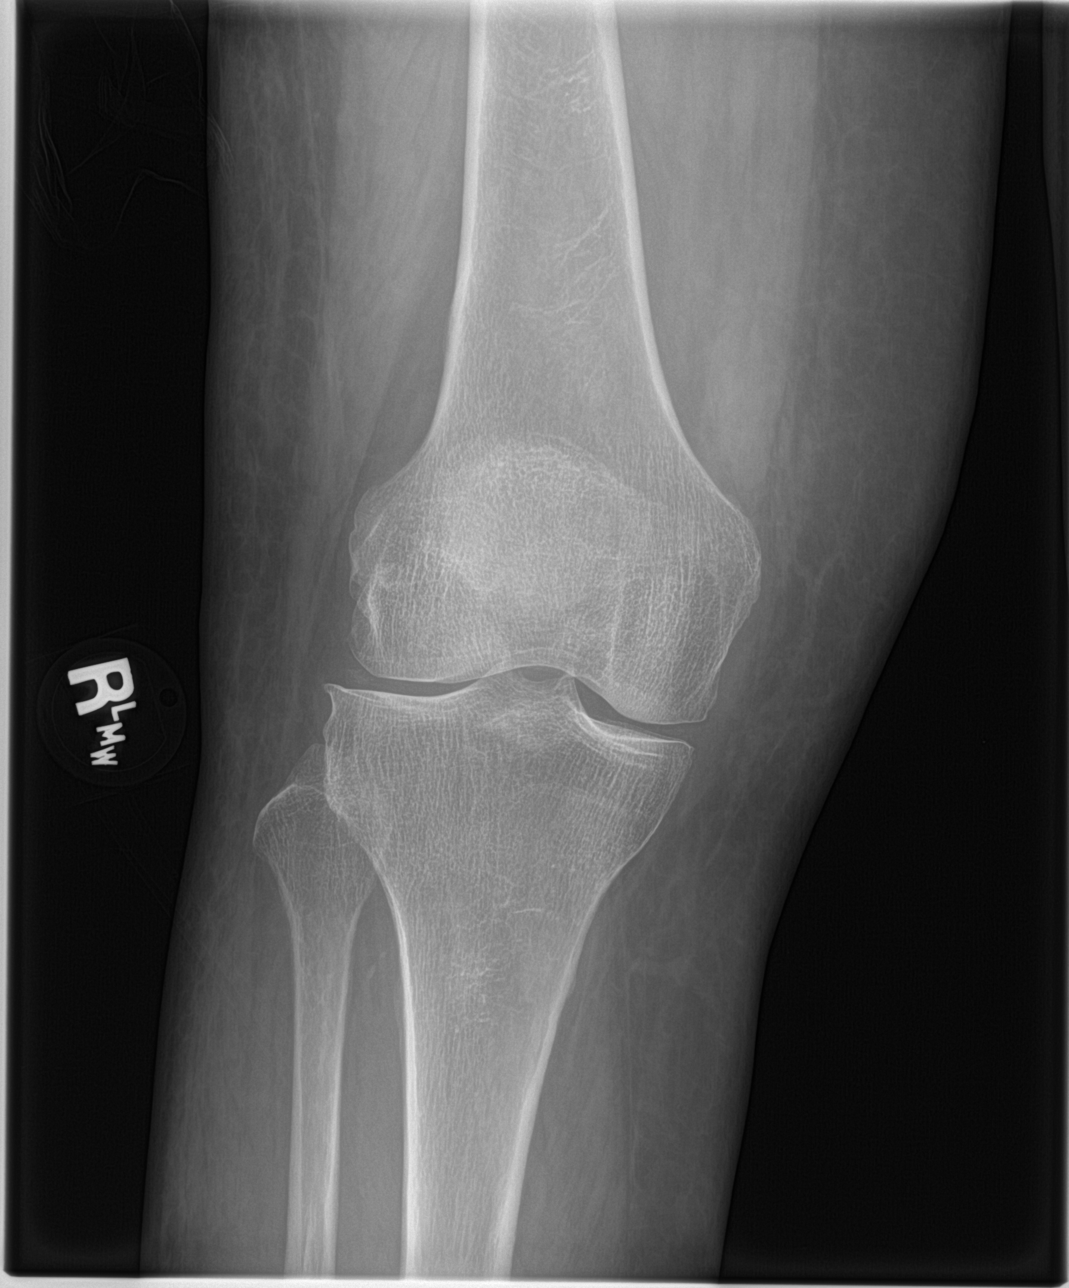
[im 2/4]
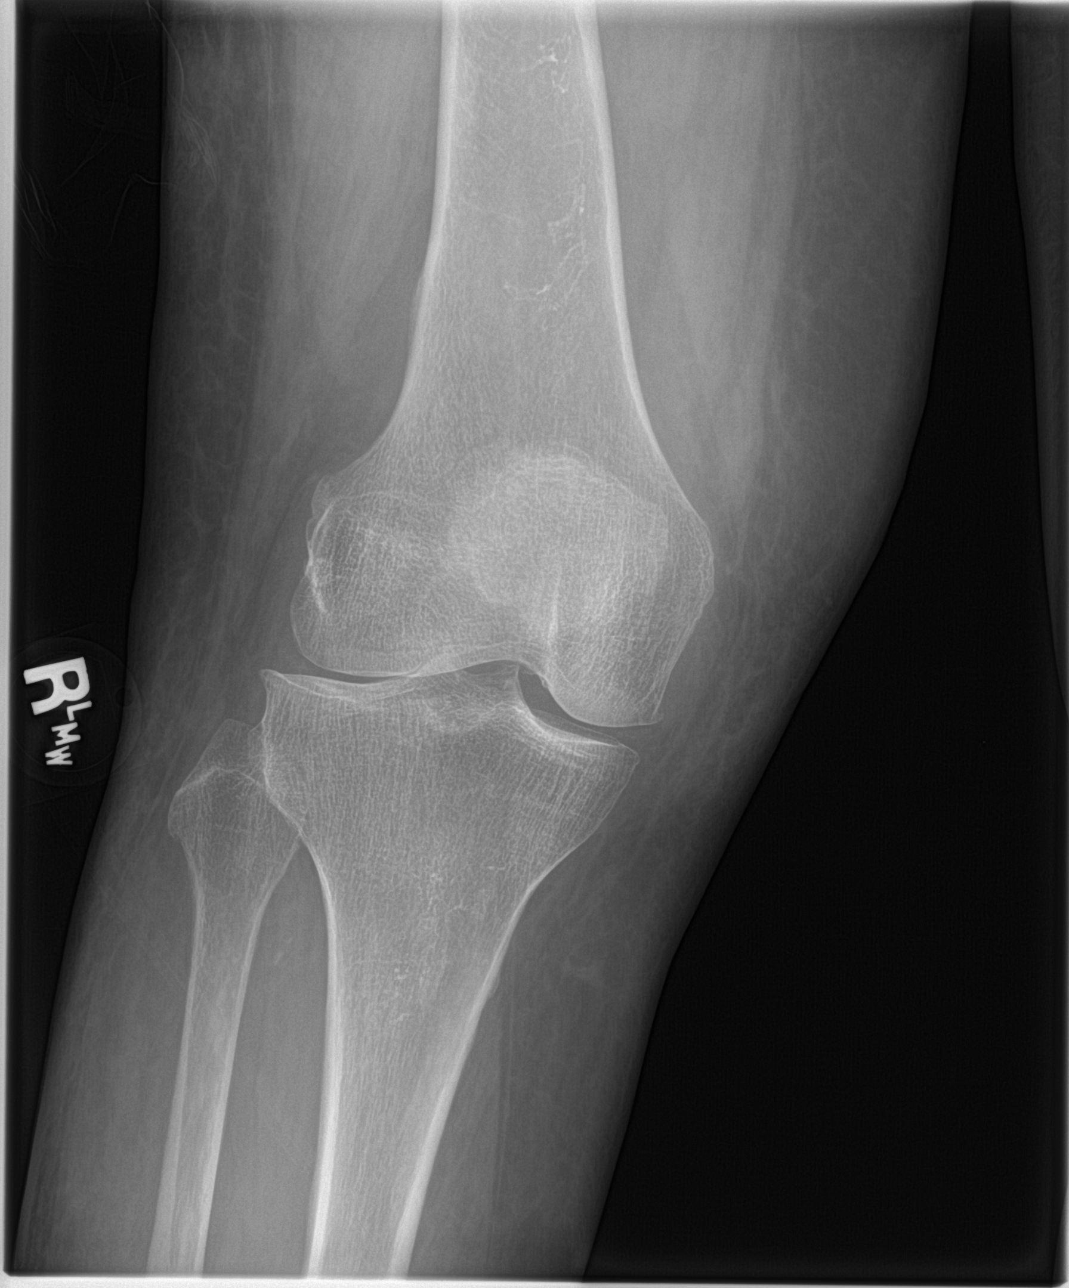
[im 3/4]
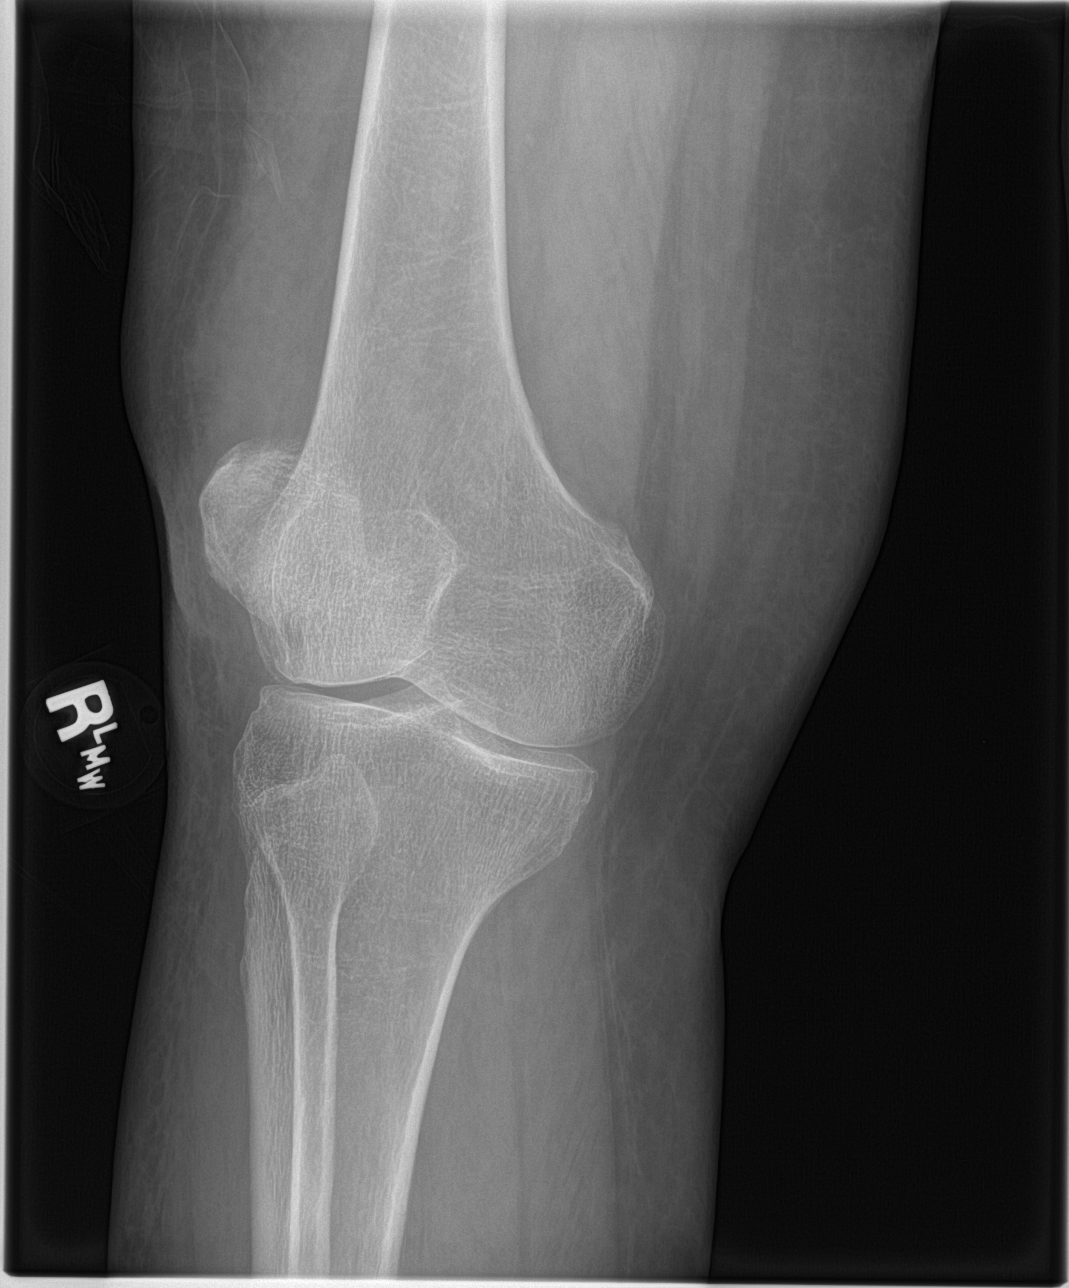
[im 4/4]
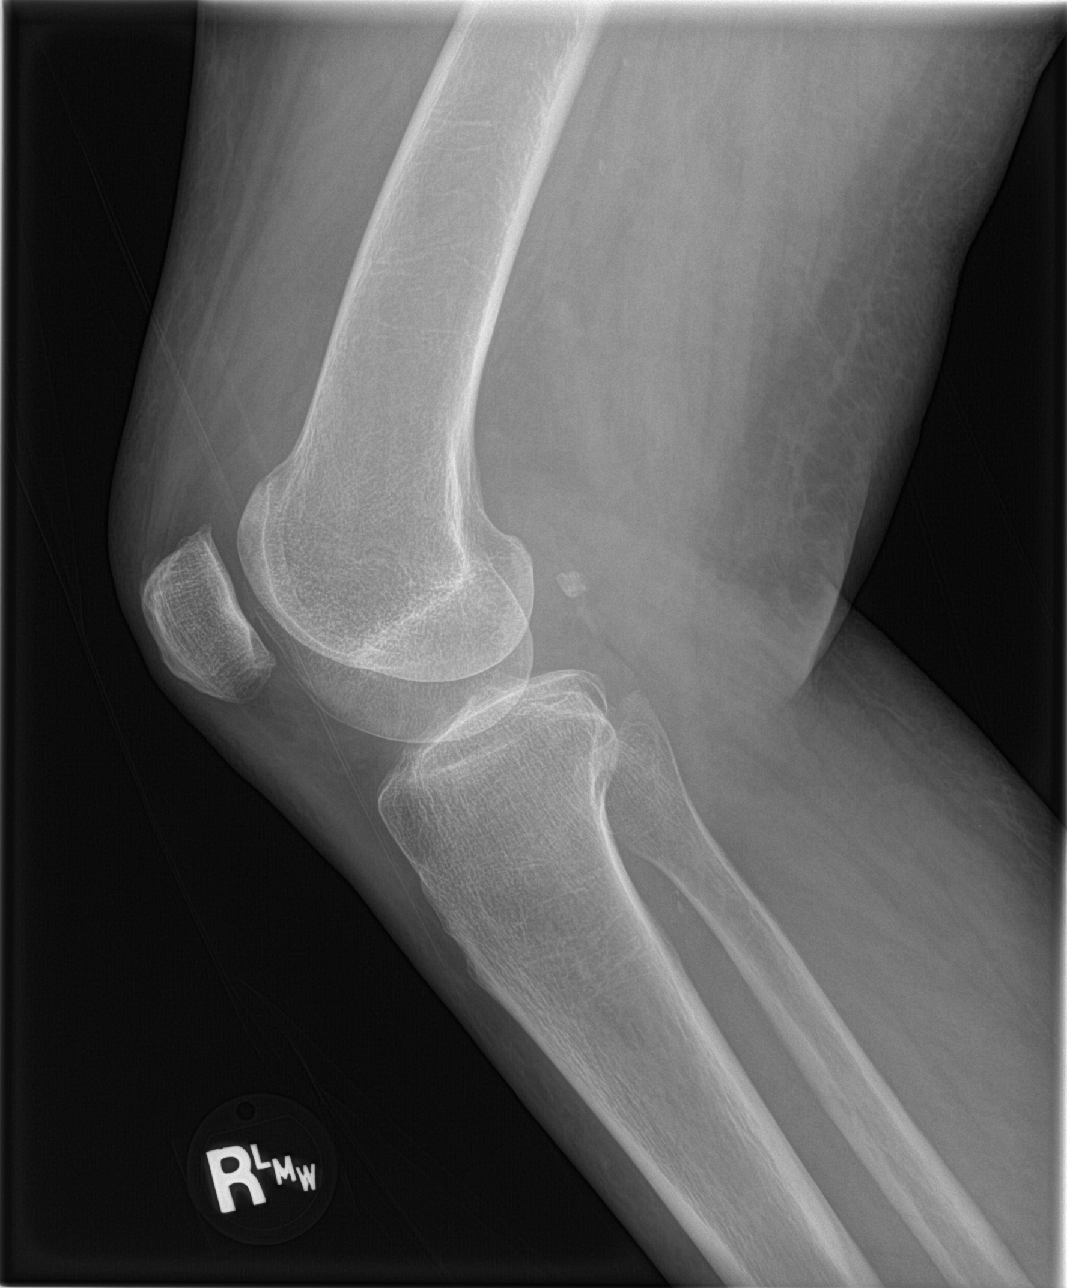

[4 of 4 positions shown; findings below may reference images not displayed]

FINDINGS: No evidence of fracture, dislocation, or joint effusion. No evidence
of arthropathy or other focal bone abnormality. Soft tissues are
unremarkable.
IMPRESSION: Negative.

## 2016-10-28 IMAGING — CR DG CHEST 2V
1 series · 2 of 2 positions shown · non-contrast
Comparison: 01/31/2016

CLINICAL DATA: Bilateral lower extremity pain. Cellulitis in the
lower legs. Increasing pain and swelling. History of diabetes.

EXAM:
CHEST  2 VIEW

[Series 1: dg chest 2 view · 0.14mm/px · 2 of 2 slices shown]
[im 1/2]
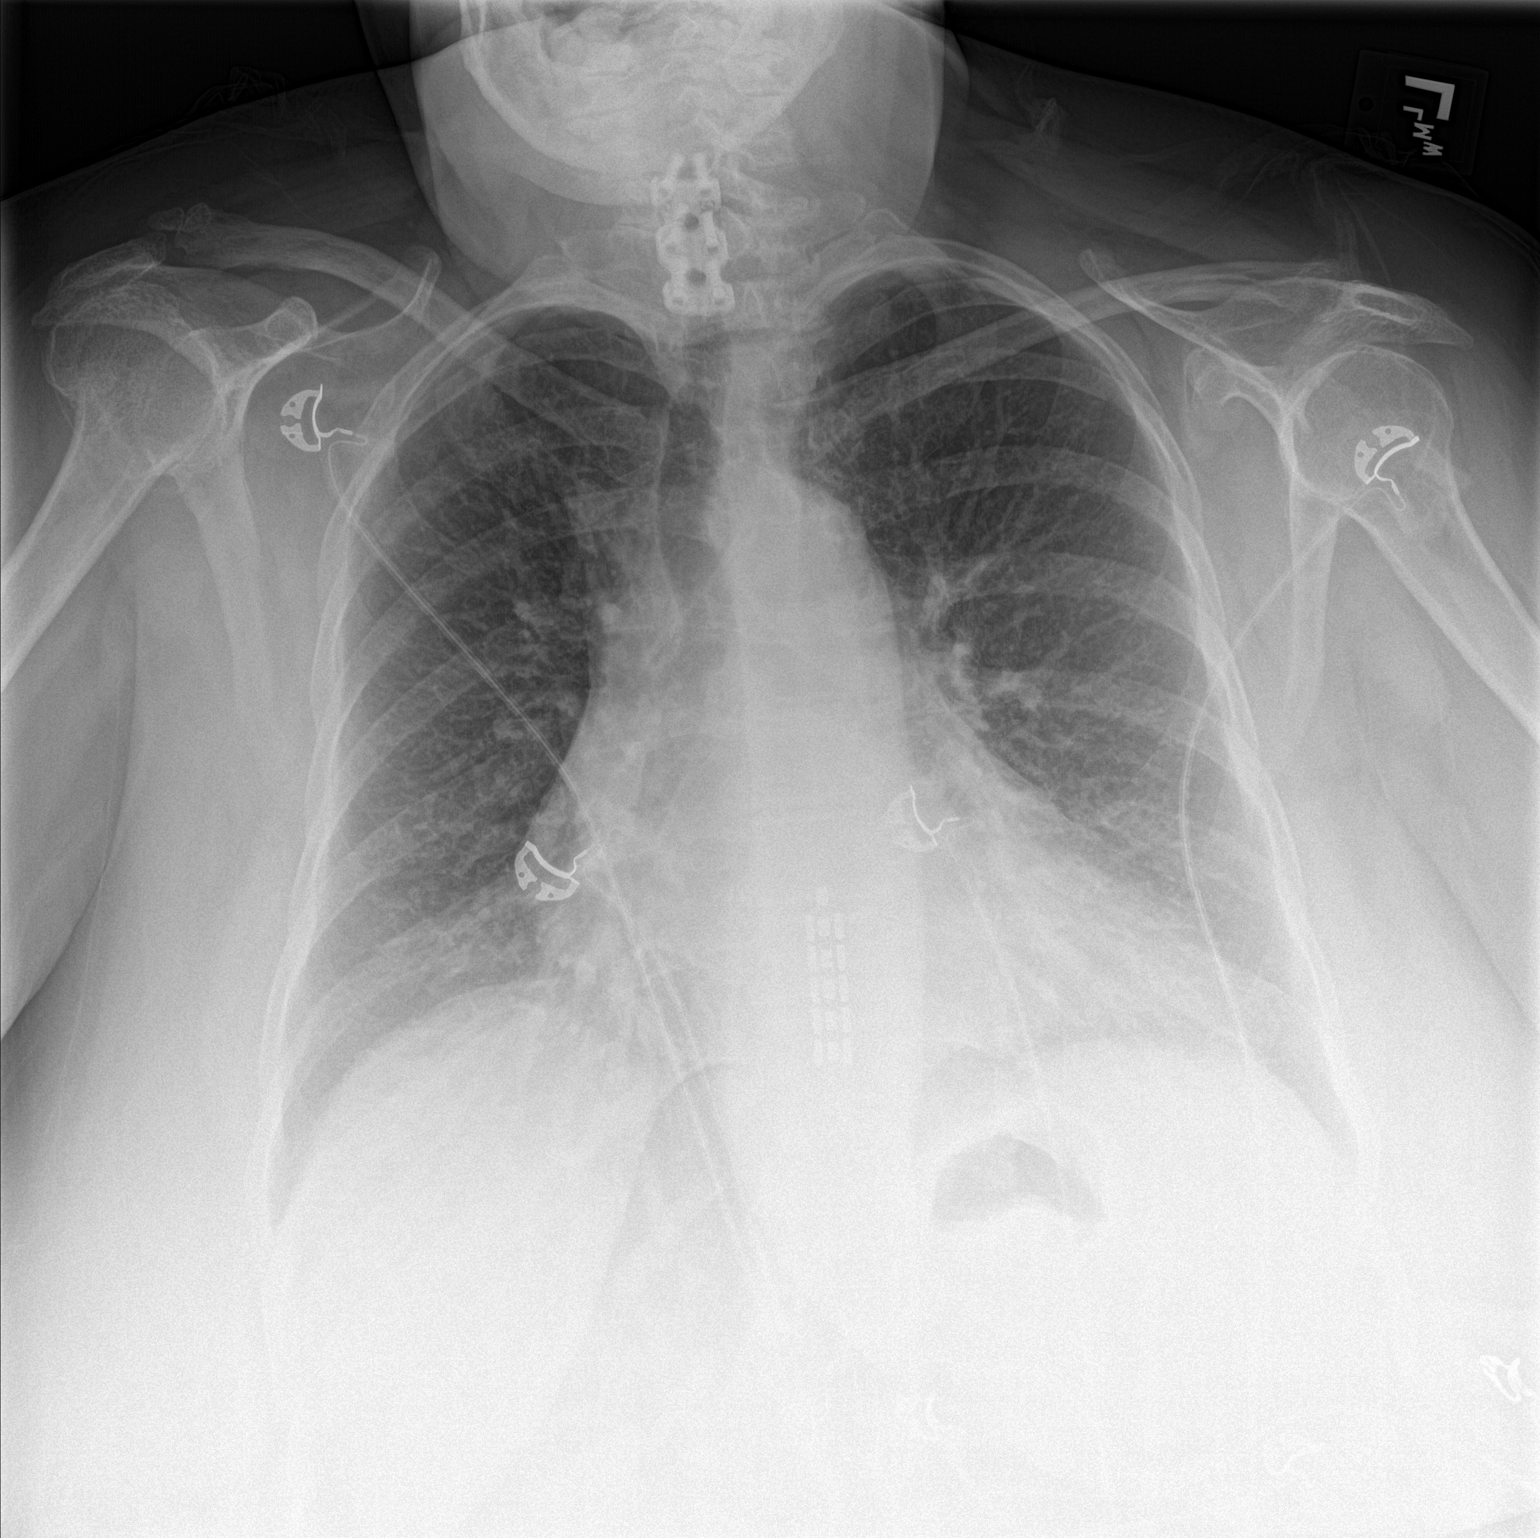
[im 2/2]
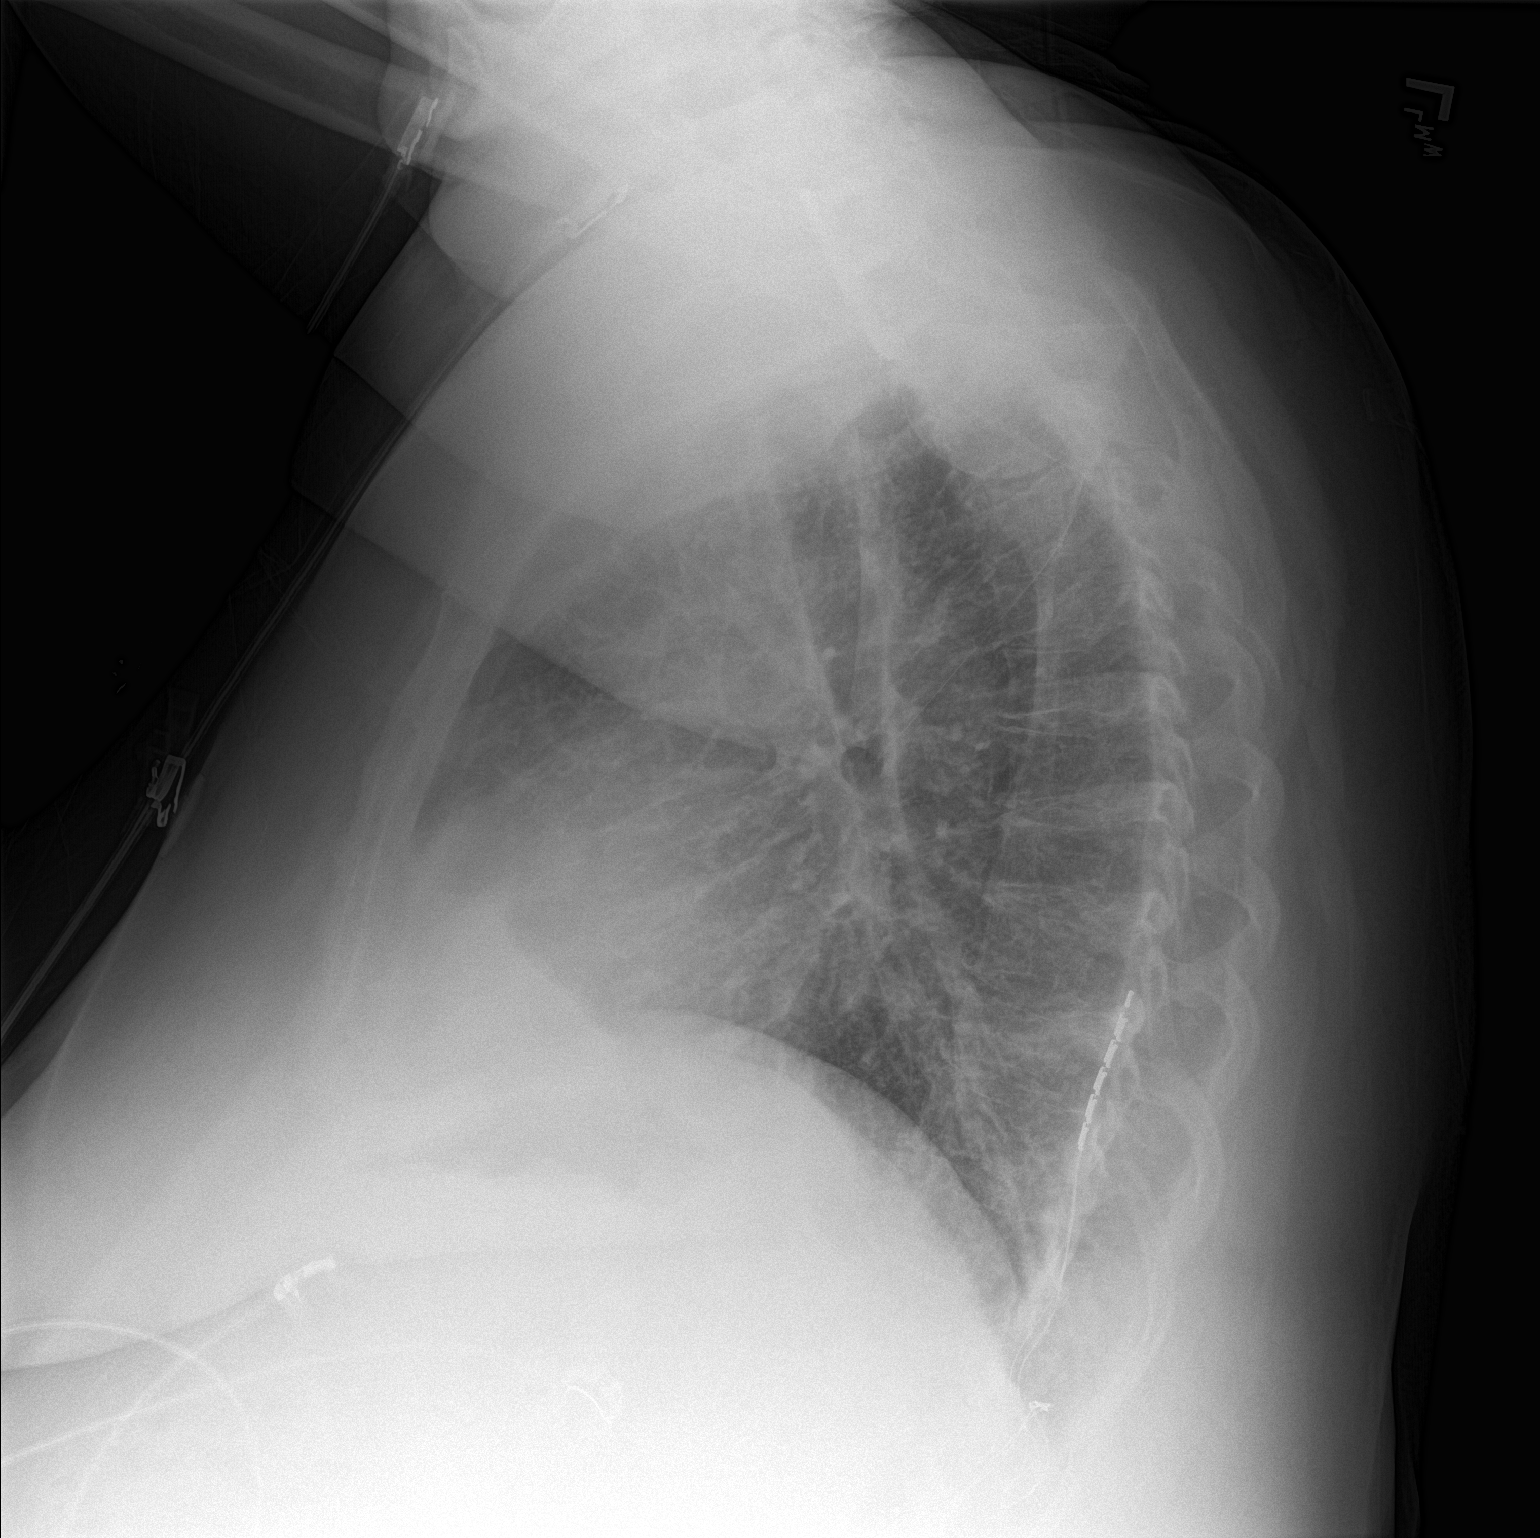

[2 of 2 positions shown; findings below may reference images not displayed]

FINDINGS: Cardiac enlargement. Pulmonary vascularity is normal. No focal
airspace disease or consolidation in the lungs. No blunting of
costophrenic angles. No pneumothorax. Mediastinal contours appear
intact. Postoperative changes in the cervical spine. Stimulator
leads in the mid thoracic region. Probable old fracture deformity of
the right proximal humerus.
IMPRESSION: Mild cardiac enlargement.  No evidence of active pulmonary disease.

## 2016-11-01 ENCOUNTER — Telehealth: Payer: Self-pay | Admitting: *Deleted

## 2016-11-01 NOTE — Telephone Encounter (Signed)
Asking for results of labs from [facility] from last week

## 2016-11-01 NOTE — Telephone Encounter (Signed)
  Please find labs to review.  I have not seen.  M

## 2016-11-02 NOTE — Telephone Encounter (Signed)
Dr. Mike Gip will call her with results

## 2016-11-03 ENCOUNTER — Encounter
Admission: RE | Admit: 2016-11-03 | Discharge: 2016-11-03 | Disposition: A | Discharge status: Home / Self Care | Payer: Medicare Other | Source: Ambulatory Visit | Attending: Surgery | Admitting: Surgery

## 2016-11-03 DIAGNOSIS — Z01812 Encounter for preprocedural laboratory examination: Secondary | ICD-10-CM | POA: Insufficient documentation

## 2016-11-03 DIAGNOSIS — M1711 Unilateral primary osteoarthritis, right knee: Secondary | ICD-10-CM | POA: Insufficient documentation

## 2016-11-03 HISTORY — DX: Inflammatory liver disease, unspecified: K75.9

## 2016-11-03 LAB — CBC
HCT: 37.7 % (ref 35.0–47.0)
HEMOGLOBIN: 12.6 g/dL (ref 12.0–16.0)
MCH: 31.5 pg (ref 26.0–34.0)
MCHC: 33.5 g/dL (ref 32.0–36.0)
MCV: 94.1 fL (ref 80.0–100.0)
Platelets: 108 10*3/uL — ABNORMAL LOW (ref 150–440)
RBC: 4.01 MIL/uL (ref 3.80–5.20)
RDW: 16 % — ABNORMAL HIGH (ref 11.5–14.5)
WBC: 6.5 10*3/uL (ref 3.6–11.0)

## 2016-11-03 LAB — BASIC METABOLIC PANEL
Anion gap: 8 (ref 5–15)
BUN: 15 mg/dL (ref 6–20)
CHLORIDE: 100 mmol/L — AB (ref 101–111)
CO2: 30 mmol/L (ref 22–32)
CREATININE: 0.81 mg/dL (ref 0.44–1.00)
Calcium: 9.1 mg/dL (ref 8.9–10.3)
GFR calc non Af Amer: >60 mL/min (ref >60)
Glucose, Bld: 224 mg/dL — ABNORMAL HIGH (ref 65–99)
Potassium: 3.6 mmol/L (ref 3.5–5.1)
Sodium: 138 mmol/L (ref 135–145)

## 2016-11-03 LAB — SURGICAL PCR SCREEN
MRSA, PCR: NEGATIVE
Staphylococcus aureus: POSITIVE — AB

## 2016-11-03 NOTE — Patient Instructions (Signed)
Your procedure is scheduled on: November 15, 2016 [facility]) Report to Same Day Surgery 2nd floor medical mall ([place] Entrance-take elevator on left to 2nd floor.  Check in with surgery information desk.) To find out your arrival time please call [phone] between 1PM - 3PM on November 14, 2016 (MONDAY)   Remember: Instructions that are not followed completely may result in serious medical risk, up to and including death, or upon the discretion of your surgeon and anesthesiologist your surgery may need to be rescheduled.    _x___ 1. Do not eat food or drink liquids after midnight. No gum chewing or hard candies                           ,     __x__ 2. No Alcohol for 24 hours before or after surgery.   __x__3. No Smoking for 24 prior to surgery.   ____  4. Bring all medications with you on the day of surgery if instructed.    __x__ 5. Notify your doctor if there is any change in your medical condition     (cold, fever, infections).     Do not wear jewelry, make-up, hairpins, clips or nail polish.  Do not wear lotions, powders, or perfumes.  Do not shave 48 hours prior to surgery. Men may shave face and neck.  Do not bring valuables to the hospital.    [facility] is not responsible for any belongings or valuables.               Contacts, dentures or bridgework may not be worn into surgery.  Leave your suitcase in the car. After surgery it may be brought to your room.  For patients admitted to the hospital, discharge time is determined by your  treatment team                      Patients discharged the day of surgery will not be allowed to drive home.  You will need someone to drive you home and stay with you the night of your procedure.    Please read over the following fact sheets that you were given:   [facility] Preparing for Surgery and or MRSA Information   _x___ Take anti-hypertensive (unless it includes a diuretic), cardiac, seizure, asthma,     anti-reflux and  psychiatric medicines. These include:  1. LYRICA  2. LAMICTAL  3. WELLBUTRIN  4. CLONAZEPAM  5. GABAPENTIN  6. OMEPRAZOLE (OMEPRAZOLE AT BEDTIME ON JUNE 11 )  ____Fleets enema or Magnesium Citrate as directed.   _x___ Use CHG Soap or sage wipes as directed on instruction sheet   _X___ Use inhalers on the day of surgery and bring to hospital day of surgery ( USE ALBUTEROL AND SYMBICORT [place] )  ____ Stop Metformin and Janumet 2 days prior to surgery.    __X__ Take 1/2 of usual insulin dose the night before surgery and none on the morning surgery-----NO INSULIN THE MORNING OF SURGERY       _x___ Follow recommendations from Cardiologist, Pulmonologist or PCP regarding          stopping Aspirin, Coumadin, Pllavix ,Eliquis, Effient, or Pradaxa, and Pletal.  X____Stop Anti-inflammatories such as Advil, Aleve, Ibuprofen, Motrin, Naproxen, Naprosyn, Goodies powders or aspirin products. OK to take Tylenol  (STOP DICLOFENAC GEL ONE WEEK PRIOR TO SURGERY )    _x___  Stop supplements until after surgery.  But may continue Vitamin D, Vitamin B,and multivitamin  (STOP  VITAMIN C NOW )         __x__ Bring C-Pap to the hospital.

## 2016-11-04 ENCOUNTER — Other Ambulatory Visit: Payer: Medicare Other

## 2016-11-04 NOTE — Pre-Procedure Instructions (Signed)
Positive staph aureus sent to Dr. Roland Rack for review.  Asked if wanted any treatment?

## 2016-11-07 NOTE — Pre-Procedure Instructions (Signed)
NOTE FROM DR [facility] TREATMENT FOR POSITIVE STAPH

## 2016-11-14 MED ORDER — CEFAZOLIN SODIUM-DEXTROSE 2-4 GM/100ML-% IV SOLN
2.0000 g | Freq: Once | INTRAVENOUS | Status: AC
  Administered 2016-11-15: 2 g via INTRAVENOUS

## 2016-11-15 ENCOUNTER — Encounter: Payer: Self-pay | Admitting: *Deleted

## 2016-11-15 ENCOUNTER — Ambulatory Visit: Payer: Medicare Other | Admitting: Anesthesiology

## 2016-11-15 ENCOUNTER — Ambulatory Visit
Admission: RE | Admit: 2016-11-15 | Discharge: 2016-11-15 | Disposition: A | Discharge status: Home / Self Care | Payer: Medicare Other | Source: Ambulatory Visit | Attending: Surgery | Admitting: Surgery

## 2016-11-15 ENCOUNTER — Encounter
Admission: RE | Disposition: A | Discharge status: Home / Self Care | Payer: Self-pay | Source: Ambulatory Visit | Attending: Surgery

## 2016-11-15 DIAGNOSIS — M23261 Derangement of other lateral meniscus due to old tear or injury, right knee: Secondary | ICD-10-CM | POA: Insufficient documentation

## 2016-11-15 DIAGNOSIS — E119 Type 2 diabetes mellitus without complications: Secondary | ICD-10-CM | POA: Diagnosis not present

## 2016-11-15 DIAGNOSIS — D649 Anemia, unspecified: Secondary | ICD-10-CM | POA: Insufficient documentation

## 2016-11-15 DIAGNOSIS — Z86010 Personal history of colonic polyps: Secondary | ICD-10-CM | POA: Diagnosis not present

## 2016-11-15 DIAGNOSIS — K219 Gastro-esophageal reflux disease without esophagitis: Secondary | ICD-10-CM | POA: Diagnosis not present

## 2016-11-15 DIAGNOSIS — Z6841 Body Mass Index (BMI) 40.0 and over, adult: Secondary | ICD-10-CM | POA: Diagnosis not present

## 2016-11-15 DIAGNOSIS — M1711 Unilateral primary osteoarthritis, right knee: Secondary | ICD-10-CM | POA: Insufficient documentation

## 2016-11-15 DIAGNOSIS — F319 Bipolar disorder, unspecified: Secondary | ICD-10-CM | POA: Diagnosis not present

## 2016-11-15 DIAGNOSIS — Z809 Family history of malignant neoplasm, unspecified: Secondary | ICD-10-CM | POA: Insufficient documentation

## 2016-11-15 DIAGNOSIS — M23231 Derangement of other medial meniscus due to old tear or injury, right knee: Secondary | ICD-10-CM | POA: Insufficient documentation

## 2016-11-15 DIAGNOSIS — G2581 Restless legs syndrome: Secondary | ICD-10-CM | POA: Diagnosis not present

## 2016-11-15 DIAGNOSIS — F419 Anxiety disorder, unspecified: Secondary | ICD-10-CM | POA: Insufficient documentation

## 2016-11-15 DIAGNOSIS — Z8261 Family history of arthritis: Secondary | ICD-10-CM | POA: Insufficient documentation

## 2016-11-15 DIAGNOSIS — M94261 Chondromalacia, right knee: Secondary | ICD-10-CM | POA: Insufficient documentation

## 2016-11-15 DIAGNOSIS — Z9049 Acquired absence of other specified parts of digestive tract: Secondary | ICD-10-CM | POA: Insufficient documentation

## 2016-11-15 DIAGNOSIS — Z7984 Long term (current) use of oral hypoglycemic drugs: Secondary | ICD-10-CM | POA: Diagnosis not present

## 2016-11-15 DIAGNOSIS — J45909 Unspecified asthma, uncomplicated: Secondary | ICD-10-CM | POA: Diagnosis not present

## 2016-11-15 DIAGNOSIS — Z881 Allergy status to other antibiotic agents status: Secondary | ICD-10-CM | POA: Diagnosis not present

## 2016-11-15 DIAGNOSIS — I10 Essential (primary) hypertension: Secondary | ICD-10-CM | POA: Diagnosis not present

## 2016-11-15 DIAGNOSIS — M23300 Other meniscus derangements, unspecified lateral meniscus, right knee: Secondary | ICD-10-CM | POA: Insufficient documentation

## 2016-11-15 DIAGNOSIS — K7460 Unspecified cirrhosis of liver: Secondary | ICD-10-CM | POA: Diagnosis not present

## 2016-11-15 DIAGNOSIS — Z87891 Personal history of nicotine dependence: Secondary | ICD-10-CM | POA: Diagnosis not present

## 2016-11-15 DIAGNOSIS — Z79899 Other long term (current) drug therapy: Secondary | ICD-10-CM | POA: Insufficient documentation

## 2016-11-15 DIAGNOSIS — E559 Vitamin D deficiency, unspecified: Secondary | ICD-10-CM | POA: Insufficient documentation

## 2016-11-15 HISTORY — PX: KNEE ARTHROSCOPY WITH LATERAL RELEASE: SHX5649

## 2016-11-15 LAB — GLUCOSE, CAPILLARY
GLUCOSE-CAPILLARY: 138 mg/dL — AB (ref 65–99)
Glucose-Capillary: 133 mg/dL — ABNORMAL HIGH (ref 65–99)

## 2016-11-15 SURGERY — ARTHROSCOPY, KNEE, WITH LATERAL RETINACULUM RELEASE
Anesthesia: General | Site: Knee | Laterality: Right | Wound class: Clean

## 2016-11-15 MED ORDER — LIDOCAINE HCL (PF) 4 % IJ SOLN
INTRAMUSCULAR | Status: DC | PRN
  Administered 2016-11-15: 4 mL via RESPIRATORY_TRACT

## 2016-11-15 MED ORDER — METOCLOPRAMIDE HCL 5 MG/ML IJ SOLN: 5.0000 mg | Freq: Three times a day (TID) | INTRAMUSCULAR | Status: DC | PRN

## 2016-11-15 MED ORDER — BUPIVACAINE-EPINEPHRINE (PF) 0.5% -1:200000 IJ SOLN
INTRAMUSCULAR | Status: DC | PRN
  Administered 2016-11-15 (×2): 30 mL via PERINEURAL

## 2016-11-15 MED ORDER — LIDOCAINE HCL 1 % IJ SOLN
INTRAMUSCULAR | Status: DC | PRN
  Administered 2016-11-15: 30 mL

## 2016-11-15 MED ORDER — FENTANYL CITRATE (PF) 100 MCG/2ML IJ SOLN
INTRAMUSCULAR | Status: AC
  Administered 2016-11-15: 25 ug via INTRAVENOUS
  Filled 2016-11-15: qty 2

## 2016-11-15 MED ORDER — MIDAZOLAM HCL 2 MG/2ML IJ SOLN
INTRAMUSCULAR | Status: DC | PRN
  Administered 2016-11-15: 1 mg via INTRAVENOUS

## 2016-11-15 MED ORDER — BUPIVACAINE-EPINEPHRINE (PF) 0.5% -1:200000 IJ SOLN
INTRAMUSCULAR | Status: AC
  Filled 2016-11-15: qty 60

## 2016-11-15 MED ORDER — MIDAZOLAM HCL 2 MG/2ML IJ SOLN
INTRAMUSCULAR | Status: AC
  Filled 2016-11-15: qty 2

## 2016-11-15 MED ORDER — FENTANYL CITRATE (PF) 250 MCG/5ML IJ SOLN
INTRAMUSCULAR | Status: AC
  Filled 2016-11-15: qty 5

## 2016-11-15 MED ORDER — FENTANYL CITRATE (PF) 100 MCG/2ML IJ SOLN
INTRAMUSCULAR | Status: DC | PRN
  Administered 2016-11-15 (×2): 50 ug via INTRAVENOUS
  Administered 2016-11-15: 25 ug via INTRAVENOUS

## 2016-11-15 MED ORDER — SUGAMMADEX SODIUM 200 MG/2ML IV SOLN
INTRAVENOUS | Status: DC | PRN
  Administered 2016-11-15: 200 mg via INTRAVENOUS

## 2016-11-15 MED ORDER — OXYCODONE HCL 5 MG PO TABS
5.0000 mg | ORAL_TABLET | ORAL | Status: DC | PRN
  Administered 2016-11-15 (×2): 5 mg via ORAL
  Filled 2016-11-15 (×2): qty 1

## 2016-11-15 MED ORDER — FENTANYL CITRATE (PF) 100 MCG/2ML IJ SOLN
25.0000 ug | INTRAMUSCULAR | Status: DC | PRN
  Administered 2016-11-15 (×3): 25 ug via INTRAVENOUS

## 2016-11-15 MED ORDER — LIDOCAINE HCL (PF) 2 % IJ SOLN
INTRAMUSCULAR | Status: AC
  Filled 2016-11-15: qty 2

## 2016-11-15 MED ORDER — PROPOFOL 10 MG/ML IV BOLUS
INTRAVENOUS | Status: DC | PRN
  Administered 2016-11-15: 40 mg via INTRAVENOUS
  Administered 2016-11-15: 130 mg via INTRAVENOUS

## 2016-11-15 MED ORDER — OXYCODONE HCL 5 MG PO TABS
ORAL_TABLET | ORAL | Status: AC
  Filled 2016-11-15: qty 1

## 2016-11-15 MED ORDER — SUCCINYLCHOLINE CHLORIDE 20 MG/ML IJ SOLN
INTRAMUSCULAR | Status: DC | PRN
  Administered 2016-11-15: 100 mg via INTRAVENOUS

## 2016-11-15 MED ORDER — OXYCODONE HCL 5 MG PO TABS
ORAL_TABLET | ORAL | Status: AC
Start: 2016-11-15 — End: 2016-11-15
  Administered 2016-11-15: 5 mg via ORAL
  Filled 2016-11-15: qty 1

## 2016-11-15 MED ORDER — ROCURONIUM BROMIDE 50 MG/5ML IV SOLN
INTRAVENOUS | Status: AC
  Filled 2016-11-15: qty 1

## 2016-11-15 MED ORDER — LIDOCAINE HCL (CARDIAC) 20 MG/ML IV SOLN
INTRAVENOUS | Status: DC | PRN
  Administered 2016-11-15: 100 mg via INTRAVENOUS

## 2016-11-15 MED ORDER — SODIUM CHLORIDE 0.9 % IV SOLN
INTRAVENOUS | Status: DC
  Administered 2016-11-15: 13:00:00 via INTRAVENOUS

## 2016-11-15 MED ORDER — PROPOFOL 10 MG/ML IV BOLUS
INTRAVENOUS | Status: AC
  Filled 2016-11-15: qty 20

## 2016-11-15 MED ORDER — METOCLOPRAMIDE HCL 10 MG PO TABS: 5.0000 mg | ORAL_TABLET | Freq: Three times a day (TID) | ORAL | Status: DC | PRN

## 2016-11-15 MED ORDER — ONDANSETRON HCL 4 MG PO TABS: 4.0000 mg | ORAL_TABLET | Freq: Four times a day (QID) | ORAL | Status: DC | PRN

## 2016-11-15 MED ORDER — CEFAZOLIN SODIUM-DEXTROSE 2-4 GM/100ML-% IV SOLN
INTRAVENOUS | Status: AC
  Filled 2016-11-15: qty 100

## 2016-11-15 MED ORDER — OXYCODONE HCL 5 MG PO TABS: 5.0000 mg | ORAL_TABLET | ORAL | 0 refills | Status: DC | PRN

## 2016-11-15 MED ORDER — GLYCOPYRROLATE 0.2 MG/ML IJ SOLN
INTRAMUSCULAR | Status: AC
  Filled 2016-11-15: qty 1

## 2016-11-15 MED ORDER — ONDANSETRON HCL 4 MG/2ML IJ SOLN
INTRAMUSCULAR | Status: DC | PRN
  Administered 2016-11-15: 4 mg via INTRAVENOUS

## 2016-11-15 MED ORDER — ONDANSETRON HCL 4 MG/2ML IJ SOLN: 4.0000 mg | Freq: Four times a day (QID) | INTRAMUSCULAR | Status: DC | PRN

## 2016-11-15 MED ORDER — KETOROLAC TROMETHAMINE 30 MG/ML IJ SOLN
INTRAMUSCULAR | Status: DC | PRN
  Administered 2016-11-15: 30 mg via INTRAVENOUS

## 2016-11-15 MED ORDER — OXYCODONE HCL 5 MG PO TABS
5.0000 mg | ORAL_TABLET | ORAL | Status: DC | PRN
  Filled 2016-11-15: qty 2

## 2016-11-15 MED ORDER — ONDANSETRON HCL 4 MG/2ML IJ SOLN: 4.0000 mg | Freq: Once | INTRAMUSCULAR | Status: DC | PRN

## 2016-11-15 MED ORDER — LIDOCAINE HCL (PF) 1 % IJ SOLN
INTRAMUSCULAR | Status: AC
  Filled 2016-11-15: qty 30

## 2016-11-15 SURGICAL SUPPLY — 35 items
BAG COUNTER SPONGE EZ (MISCELLANEOUS) IMPLANT
BAG SPNG 4X4 CLR HAZ (MISCELLANEOUS)
BANDAGE ACE 6X5 VEL STRL LF (GAUZE/BANDAGES/DRESSINGS) ×3 IMPLANT
BLADE FULL RADIUS 3.5 (BLADE) ×1 IMPLANT
BLADE SHAVER 4.5X7 STR FR (MISCELLANEOUS) ×1 IMPLANT
CHLORAPREP W/TINT 26ML (MISCELLANEOUS) ×3 IMPLANT
COUNTER SPONGE BAG EZ (MISCELLANEOUS)
CUFF TOURN 24 STER (MISCELLANEOUS) ×2 IMPLANT
CUFF TOURN 30 STER DUAL PORT (MISCELLANEOUS) IMPLANT
DRAPE IMP U-DRAPE 54X76 (DRAPES) ×3 IMPLANT
ELECT REM PT RETURN 9FT ADLT (ELECTROSURGICAL)
ELECTRODE REM PT RTRN 9FT ADLT (ELECTROSURGICAL) ×1 IMPLANT
GAUZE SPONGE 4X4 12PLY STRL (GAUZE/BANDAGES/DRESSINGS) ×3 IMPLANT
GLOVE BIO SURGEON STRL SZ8 (GLOVE) ×6 IMPLANT
GLOVE BIOGEL M 7.0 STRL (GLOVE) ×6 IMPLANT
GLOVE BIOGEL PI IND STRL 7.5 (GLOVE) ×1 IMPLANT
GLOVE BIOGEL PI INDICATOR 7.5 (GLOVE) ×2
GLOVE INDICATOR 8.0 STRL GRN (GLOVE) ×3 IMPLANT
GOWN STRL REUS W/ TWL LRG LVL3 (GOWN DISPOSABLE) ×1 IMPLANT
GOWN STRL REUS W/ TWL XL LVL3 (GOWN DISPOSABLE) ×2 IMPLANT
GOWN STRL REUS W/TWL LRG LVL3 (GOWN DISPOSABLE) ×3
GOWN STRL REUS W/TWL XL LVL3 (GOWN DISPOSABLE) ×6
IV LACTATED RINGER IRRG 3000ML (IV SOLUTION) ×3
IV LR IRRIG 3000ML ARTHROMATIC (IV SOLUTION) ×1 IMPLANT
KIT RM TURNOVER STRD PROC AR (KITS) ×3 IMPLANT
MANIFOLD NEPTUNE II (INSTRUMENTS) ×3 IMPLANT
NDL HYPO 21X1.5 SAFETY (NEEDLE) ×1 IMPLANT
NEEDLE HYPO 21X1.5 SAFETY (NEEDLE) ×3 IMPLANT
PACK ARTHROSCOPY KNEE (MISCELLANEOUS) ×3 IMPLANT
PENCIL ELECTRO HAND CTR (MISCELLANEOUS) ×3 IMPLANT
SUT PROLENE 4 0 PS 2 18 (SUTURE) ×3 IMPLANT
SUT TICRON COATED BLUE 2 0 30 (SUTURE) IMPLANT
SYR 50ML LL SCALE MARK (SYRINGE) ×3 IMPLANT
TUBING ARTHRO INFLOW-ONLY STRL (TUBING) ×3 IMPLANT
WAND HAND CNTRL MULTIVAC 90 (MISCELLANEOUS) ×1 IMPLANT

## 2016-11-15 NOTE — Discharge Instructions (Addendum)
Keep dressing dry and intact.  May shower after dressing changed on post-op day #4 (Saturday).  Cover staples/sutures with Band-Aids after drying off. Apply ice frequently to knee. Take pain medication as prescribed or ES Tylenol when needed.  May weight-bear as tolerated - use cane or walker as needed. Follow-up in 10-14 days or as scheduled.   AMBULATORY SURGERY  DISCHARGE INSTRUCTIONS   1) The drugs that you were given will stay in your system until tomorrow so for the next 24 hours you should not:  A) Drive an automobile B) Make any legal decisions C) Drink any alcoholic beverage   2) You may resume regular meals tomorrow.  Today it is better to start with liquids and gradually work up to solid foods.  You may eat anything you prefer, but it is better to start with liquids, then soup and crackers, and gradually work up to solid foods.   3) Please notify your doctor immediately if you have any unusual bleeding, trouble breathing, redness and pain at the surgery site, drainage, fever, or pain not relieved by medication. 4)   5) Your post-operative visit with Dr.                                     is: Date:                        Time:    Please call to schedule your post-operative visit.  6) Additional Instructions:

## 2016-11-15 NOTE — Op Note (Signed)
11/15/2016  2:39 PM  Patient:   Stephanie [name]  Pre-Op Diagnosis:   Degenerative joint disease with possible meniscal tears, right knee.  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  Procedure:   Arthroscopic partial medial and lateral meniscectomies, arthroscopic abrasion chondroplasty of grade 2-3 chondromalacia of lateral tibial plateau and of grade 3-4 chondromalacia of patellofemoral compartment, and arthroscopic lateral release, right knee.  Surgeon:   Pascal Lux, M.D.  Anesthesia:   GET.  Findings:   As above. The areas of grade 4 chondromalacia involved the lateral patellar facet and the anterior aspect of the lateral femoral condyle there were grade 1-2 chondromalacial changes involving the femoral trochlea centrally and the medial compartment. The anterior posterior cruciate ligaments both were in satisfactory condition.  Complications:   None.  EBL:   <5 cc.  Total fluids:   750 cc of crystalloid.  Tourniquet time:   None  Drains:   None  Closure:   4-0 Prolene interrupted sutures.  Brief clinical note:   The patient is a 66 year old female with a history of anterolateral right knee pain. Her symptoms have persisted despite medications, activity modification, injections, etc. Her history and examination are consistent with degenerative joint disease. An MRI scan demonstrated degenerative tearing of the medial and lateral menisci as well as eccentric wear in the lateral portion of the patellofemoral compartment. The patient presents at this time for arthroscopy, debridement, possible partial medial and/or lateral meniscectomies, and possible lateral release of her right knee.  Procedure:   The patient was brought into the operating room and lain in the supine position. After adequate general endotracheal intubation and anesthesia was obtained, a timeout was performed to verify the appropriate side. The  patient's right knee was injected sterilely using a solution of 30 cc of 1% lidocaine and 30 cc of 0.5% Sensorcaine with epinephrine. The right lower extremity was prepped with ChloraPrep solution before being draped sterilely. Preoperative antibiotics were administered. The expected portal sites were injected with 0.5% Sensorcaine with epinephrine before the camera was placed in the anterolateral portal and instrumentation performed through the anteromedial portal. The knee was sequentially examined beginning in the suprapatellar pouch, then progressing to the patellofemoral space, the medial gutter compartment, the notch, and finally the lateral compartment and gutter. The findings were as described above. Abundant reactive synovial tissues anteriorly were debrided using the full-radius resector in order to improve visualization. The areas of degenerative tearing of the medial and lateral menisci were debrided back to stable margins using straight and angled baskets as well as the full-radius resector. Some probing the remaining rims demonstrated excellent stability. A small osteophyte in the notch just medial to the posterior cruciate ligament was removed using an osteotome. Other osteophytes around the patella were debrided using the full-radius resector. The camera was repositioned the medial portal. The shaver was introduced and the lateral portal to remove additional areas of infrapatellar fat pad and synovial tissue noted optimally visualize the lateral retinaculum. The lateral retinacular release was performed using ArthroCare Saber device under arthroscopic visualization. Hemostasis was achieved using the Saber device. The instruments were removed from the joint after suctioning the excess fluid. The portal sites were closed using 4-0 Prolene interrupted sutures before a sterile bulky dressing was applied to the knee. The patient was then awakened, extubated, and returned to the recovery room in  satisfactory condition after tolerating the procedure well.

## 2016-11-15 NOTE — Anesthesia Preprocedure Evaluation (Signed)
Anesthesia Evaluation  Patient identified by MRN, date of birth, ID band Patient awake    Reviewed: Allergy & Precautions, H&P , NPO status , Patient's Chart, lab work & pertinent test results, reviewed documented beta blocker date and time   Airway Mallampati: III  TM Distance: >3 FB Neck ROM: full    Dental  (+) Edentulous Upper, Edentulous Lower   Pulmonary neg pulmonary ROS, shortness of breath and with exertion, asthma , sleep apnea and Continuous Positive Airway Pressure Ventilation , COPD, neg recent URI, former smoker,           Cardiovascular Exercise Tolerance: Good hypertension, (-) angina+ CAD, + Peripheral Vascular Disease and + DOE  (-) Past MI, (-) Cardiac Stents and (-) CABG (-) dysrhythmias + Valvular Problems/Murmurs      Neuro/Psych neg Seizures PSYCHIATRIC DISORDERS (Bipolar) TIA   GI/Hepatic GERD  ,(+) Cirrhosis       ,   Endo/Other  diabetes, Well Controlled, Insulin DependentMorbid obesity  Renal/GU negative Renal ROS  negative genitourinary   Musculoskeletal   Abdominal   Peds  Hematology  (+) Blood dyscrasia, anemia ,   Anesthesia Other Findings Past Medical History: 08/27/2014: Adenomatous colon polyp     Comment: Polyps x 3 No date: Anemia No date: Anxiety No date: Aortic dissection (HCC)     Comment: Type 1 No date: Arthritis No date: Asthma No date: Bipolar affective disorder (HCC)     Comment: h/o 2000: Blood transfusion No date: Blood transfusion without reported diagnosis No date: Chronic abdominal pain     Comment: abdominal wall pain No date: 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 No date: Colon polyp No date: Depression No date: Diabetes mellitus without complication ([place]) 35/6/86: Fatty liver     Comment: found in abd CT No date: GERD (gastroesophageal reflux disease) No date: H/O: CVA (cardiovascular accident)     Comment: TIA No date: H/O: rheumatic  fever No date: Hepatitis     Comment: HEP "B" twenty years ago No date: Hyperlipidemia No date: Incontinence No date: Insomnia No date: Lower extremity edema No date: Obstructive sleep apnea     Comment: Use C-PAP twice per week No date: Platelets decreased (HCC) No date: RLS (restless legs syndrome) No date: Shortness of breath No date: Sleep apnea No date: Stroke [facility])     Comment: TIA- 2002 No date: Ulcer No date: Unspecified disorders of nervous system   Reproductive/Obstetrics negative OB ROS                             Anesthesia Physical Anesthesia Plan  ASA: III  Anesthesia Plan: General   Post-op Pain Management:    Induction: Intravenous  PONV Risk Score and Plan: 2 and Ondansetron and Dexamethasone  Airway Management Planned: LMA  Additional Equipment:   Intra-op Plan:   Post-operative Plan: Extubation in OR  Informed Consent: I have reviewed the patients History and Physical, chart, labs and discussed the procedure including the risks, benefits and alternatives for the proposed anesthesia with the patient or authorized representative who has indicated his/her understanding and acceptance.   Dental Advisory Given  Plan Discussed with: Anesthesiologist, CRNA and Surgeon  Anesthesia Plan Comments:         Anesthesia Quick Evaluation

## 2016-11-15 NOTE — H&P (Signed)
Paper H&P to be scanned into permanent record. H&P reviewed and patient re-examined. No changes. 

## 2016-11-15 NOTE — Anesthesia Postprocedure Evaluation (Signed)
Anesthesia Post Note  Patient: Stephanie [name]  Procedure(s) Performed: Procedure(s) (LRB): KNEE ARTHROSCOPY WITH LATERAL RELEASE (Right)  Patient location during evaluation: PACU Anesthesia Type: General Level of consciousness: awake and alert Pain management: pain level controlled Vital Signs Assessment: post-procedure vital signs reviewed and stable Respiratory status: spontaneous breathing, nonlabored ventilation, respiratory function stable and patient connected to nasal cannula oxygen Cardiovascular status: blood pressure returned to baseline and stable Postop Assessment: no signs of nausea or vomiting Anesthetic complications: no     Last Vitals:  Vitals:   11/15/16 1515 11/15/16 1525  BP:  (!) 98/53  Pulse: 77 77  Resp: 14 15  Temp:      Last Pain:  Vitals:   11/15/16 1525  TempSrc:   PainSc: 2                  Martha Clan

## 2016-11-15 NOTE — Transfer of Care (Signed)
Immediate Anesthesia Transfer of Care Note  Patient: Stephanie [name]  Procedure(s) Performed: Procedure(s): KNEE ARTHROSCOPY WITH LATERAL RELEASE (Right)  Patient Location: PACU  Anesthesia Type:General  Level of Consciousness: awake and patient cooperative  Airway & Oxygen Therapy: Patient Spontanous Breathing and Patient connected to nasal cannula oxygen  Post-op Assessment: Report given to RN and Post -op Vital signs reviewed and stable  Post vital signs: Reviewed and stable  Last Vitals:  Vitals:   11/15/16 1304 11/15/16 1440  BP: (!) 110/46 (!) 100/49  Pulse: 86 85  Resp: 16 (!) 24  Temp: 36.6 C 37.3 C    Last Pain:  Vitals:   11/15/16 1304  TempSrc: Oral  PainSc: 6          Complications: No apparent anesthesia complications

## 2016-11-15 NOTE — Anesthesia Post-op Follow-up Note (Cosign Needed)
Anesthesia QCDR form completed.        

## 2016-11-15 NOTE — Anesthesia Procedure Notes (Signed)
Procedure Name: Intubation Date/Time: 11/15/2016 1:30 PM Performed by: Rosaria Ferries, Weronika Birch Pre-anesthesia Checklist: Patient identified, Emergency Drugs available, Suction available and Patient being monitored Patient Re-evaluated:Patient Re-evaluated prior to inductionOxygen Delivery Method: Circle system utilized Preoxygenation: Pre-oxygenation with 100% oxygen Intubation Type: IV induction Laryngoscope Size: Mac and 3 Grade View: Grade I Tube size: 7.0 mm Number of attempts: 1 Airway Equipment and Method: LTA kit utilized Placement Confirmation: ETT inserted through vocal cords under direct vision,  positive ETCO2 and breath sounds checked- equal and bilateral Secured at: 21 cm Tube secured with: Tape Dental Injury: Teeth and Oropharynx as per pre-operative assessment

## 2016-11-16 ENCOUNTER — Encounter: Payer: Self-pay | Admitting: Surgery

## 2016-11-22 ENCOUNTER — Encounter: Payer: Self-pay | Admitting: Hematology and Oncology

## 2016-11-23 ENCOUNTER — Emergency Department: Payer: Medicare Other

## 2016-11-23 ENCOUNTER — Encounter: Payer: Self-pay | Admitting: Emergency Medicine

## 2016-11-23 ENCOUNTER — Emergency Department
Admission: EM | Admit: 2016-11-23 | Discharge: 2016-11-23 | Disposition: A | Discharge status: Home / Self Care | Payer: Medicare Other | Attending: Emergency Medicine | Admitting: Emergency Medicine

## 2016-11-23 DIAGNOSIS — N390 Urinary tract infection, site not specified: Secondary | ICD-10-CM | POA: Diagnosis not present

## 2016-11-23 DIAGNOSIS — J45909 Unspecified asthma, uncomplicated: Secondary | ICD-10-CM | POA: Insufficient documentation

## 2016-11-23 DIAGNOSIS — Z8673 Personal history of transient ischemic attack (TIA), and cerebral infarction without residual deficits: Secondary | ICD-10-CM | POA: Insufficient documentation

## 2016-11-23 DIAGNOSIS — E119 Type 2 diabetes mellitus without complications: Secondary | ICD-10-CM | POA: Insufficient documentation

## 2016-11-23 DIAGNOSIS — M25461 Effusion, right knee: Secondary | ICD-10-CM

## 2016-11-23 DIAGNOSIS — Z794 Long term (current) use of insulin: Secondary | ICD-10-CM | POA: Insufficient documentation

## 2016-11-23 DIAGNOSIS — Z87891 Personal history of nicotine dependence: Secondary | ICD-10-CM | POA: Insufficient documentation

## 2016-11-23 DIAGNOSIS — G8918 Other acute postprocedural pain: Secondary | ICD-10-CM | POA: Insufficient documentation

## 2016-11-23 DIAGNOSIS — I10 Essential (primary) hypertension: Secondary | ICD-10-CM | POA: Diagnosis not present

## 2016-11-23 LAB — COMPREHENSIVE METABOLIC PANEL
ALK PHOS: 212 U/L — AB (ref 38–126)
ALT: 24 U/L (ref 14–54)
ANION GAP: 6 (ref 5–15)
AST: 27 U/L (ref 15–41)
Albumin: 3.6 g/dL (ref 3.5–5.0)
BILIRUBIN TOTAL: 0.9 mg/dL (ref 0.3–1.2)
BUN: 12 mg/dL (ref 6–20)
CALCIUM: 9.3 mg/dL (ref 8.9–10.3)
CO2: 31 mmol/L (ref 22–32)
Chloride: 101 mmol/L (ref 101–111)
Creatinine, Ser: 0.83 mg/dL (ref 0.44–1.00)
GFR calc Af Amer: >60 mL/min (ref >60)
GLUCOSE: 240 mg/dL — AB (ref 65–99)
Potassium: 4.2 mmol/L (ref 3.5–5.1)
Sodium: 138 mmol/L (ref 135–145)
TOTAL PROTEIN: 7.5 g/dL (ref 6.5–8.1)

## 2016-11-23 LAB — SYNOVIAL CELL COUNT + DIFF, W/ CRYSTALS
CRYSTALS FLUID: NONE SEEN
Eosinophils-Synovial: 1 %
Lymphocytes-Synovial Fld: 56 %
Monocyte-Macrophage-Synovial Fluid: 15 %
NEUTROPHIL, SYNOVIAL: 28 %
WBC, SYNOVIAL: 879 /mm3 — AB (ref 0–200)

## 2016-11-23 LAB — PROTIME-INR
INR: 1.09
Prothrombin Time: 14.1 seconds (ref 11.4–15.2)

## 2016-11-23 LAB — URINALYSIS, COMPLETE (UACMP) WITH MICROSCOPIC
Bilirubin Urine: NEGATIVE
Glucose, UA: NEGATIVE mg/dL
Hgb urine dipstick: NEGATIVE
Ketones, ur: NEGATIVE mg/dL
Nitrite: POSITIVE — AB
PROTEIN: NEGATIVE mg/dL
SPECIFIC GRAVITY, URINE: 1.015 (ref 1.005–1.030)
pH: 7 (ref 5.0–8.0)

## 2016-11-23 LAB — CBC WITH DIFFERENTIAL/PLATELET
BASOS ABS: 0 10*3/uL (ref 0–0.1)
Basophils Relative: 1 %
EOS PCT: 2 %
Eosinophils Absolute: 0.1 10*3/uL (ref 0–0.7)
HCT: 38.3 % (ref 35.0–47.0)
HEMOGLOBIN: 13.2 g/dL (ref 12.0–16.0)
LYMPHS PCT: 13 %
Lymphs Abs: 0.8 10*3/uL — ABNORMAL LOW (ref 1.0–3.6)
MCH: 31.7 pg (ref 26.0–34.0)
MCHC: 34.4 g/dL (ref 32.0–36.0)
MCV: 92.3 fL (ref 80.0–100.0)
Monocytes Absolute: 0.5 10*3/uL (ref 0.2–0.9)
Monocytes Relative: 7 %
NEUTROS ABS: 4.9 10*3/uL (ref 1.4–6.5)
NEUTROS PCT: 77 %
PLATELETS: 135 10*3/uL — AB (ref 150–440)
RBC: 4.15 MIL/uL (ref 3.80–5.20)
RDW: 15.9 % — ABNORMAL HIGH (ref 11.5–14.5)
WBC: 6.4 10*3/uL (ref 3.6–11.0)

## 2016-11-23 LAB — LACTIC ACID, PLASMA: Lactic Acid, Venous: 1.2 mmol/L (ref 0.5–1.9)

## 2016-11-23 LAB — GRAM STAIN

## 2016-11-23 LAB — PROCALCITONIN

## 2016-11-23 LAB — APTT: aPTT: 38 seconds — ABNORMAL HIGH (ref 24–36)

## 2016-11-23 IMAGING — CR DG CHEST 2V
2 series · 2 of 2 positions shown · non-contrast
Comparison: 03/09/2016.

CLINICAL DATA: Cough.

EXAM:
CHEST  2 VIEW

[chest pa]
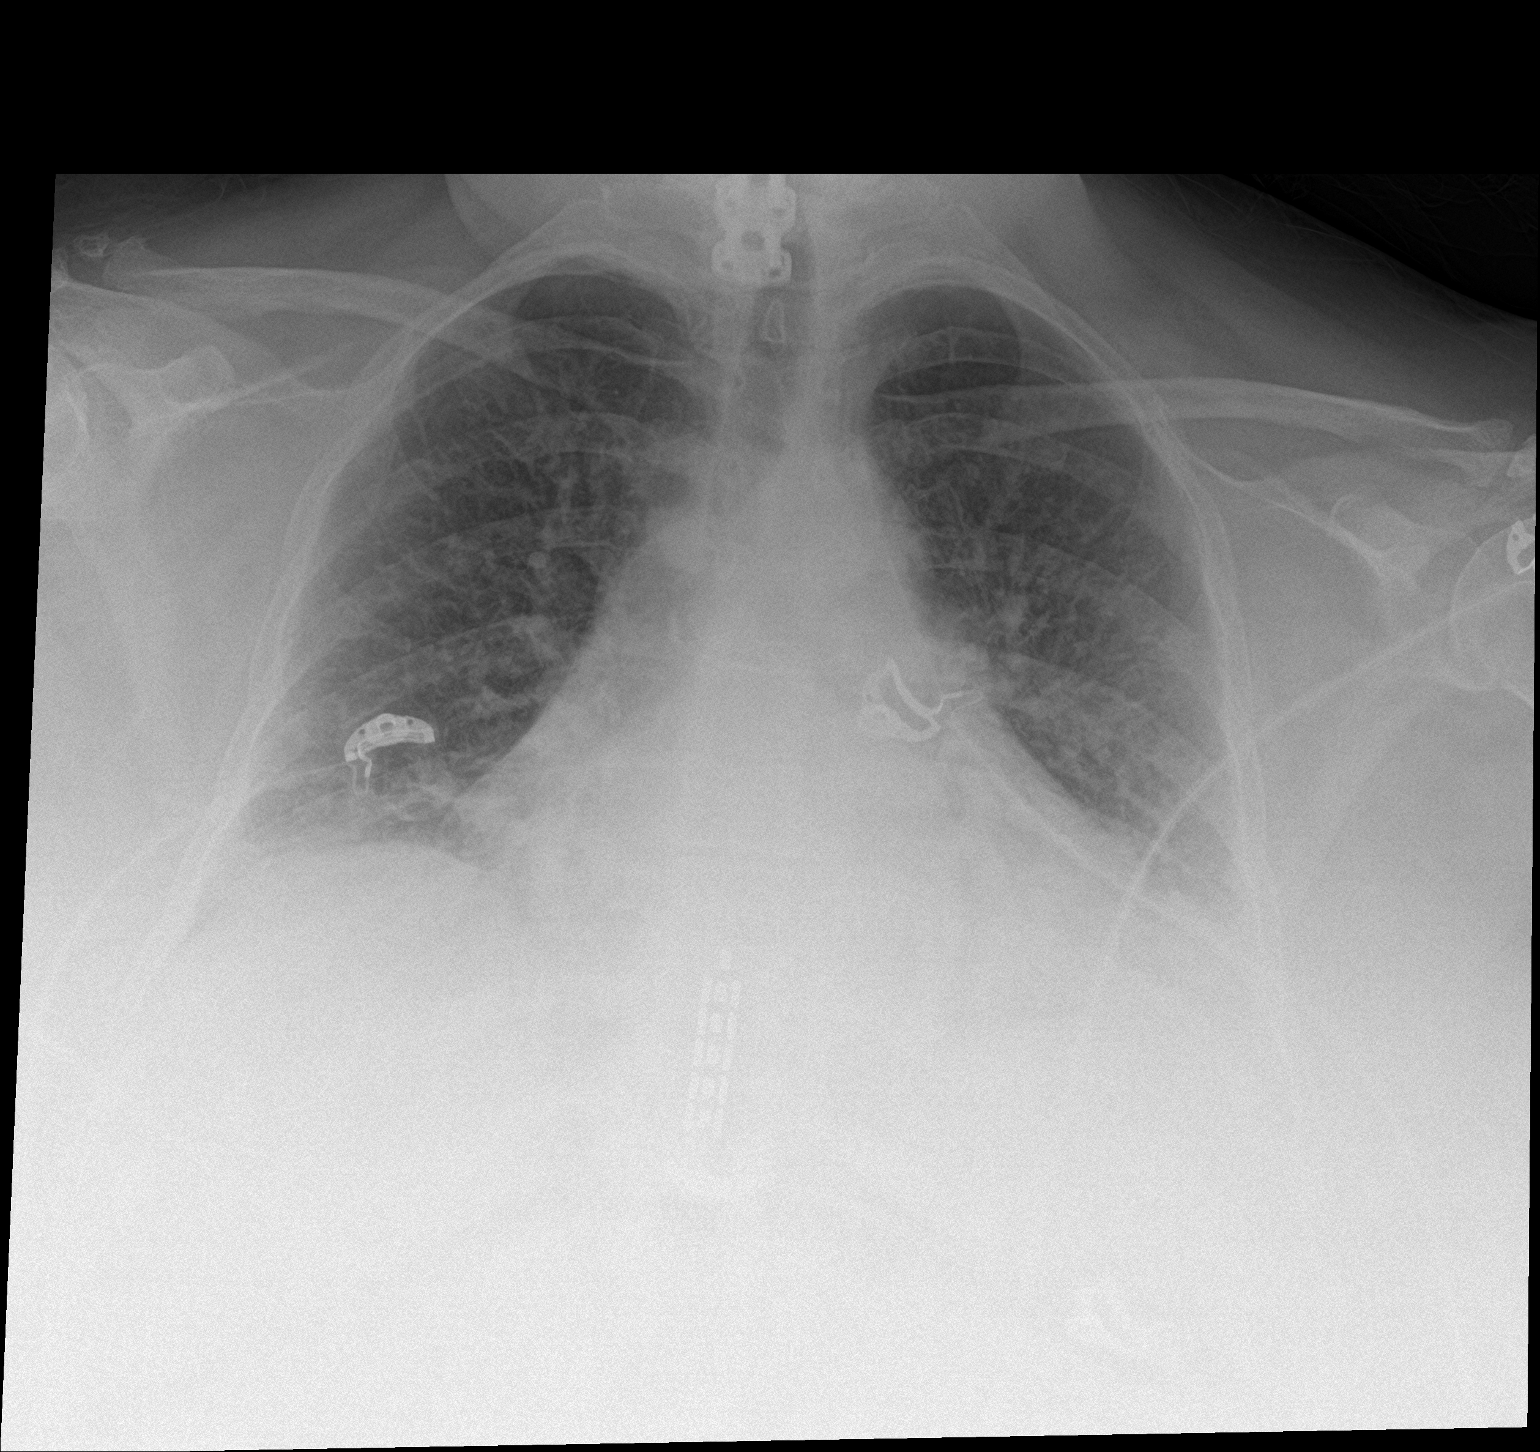

[chest lat]
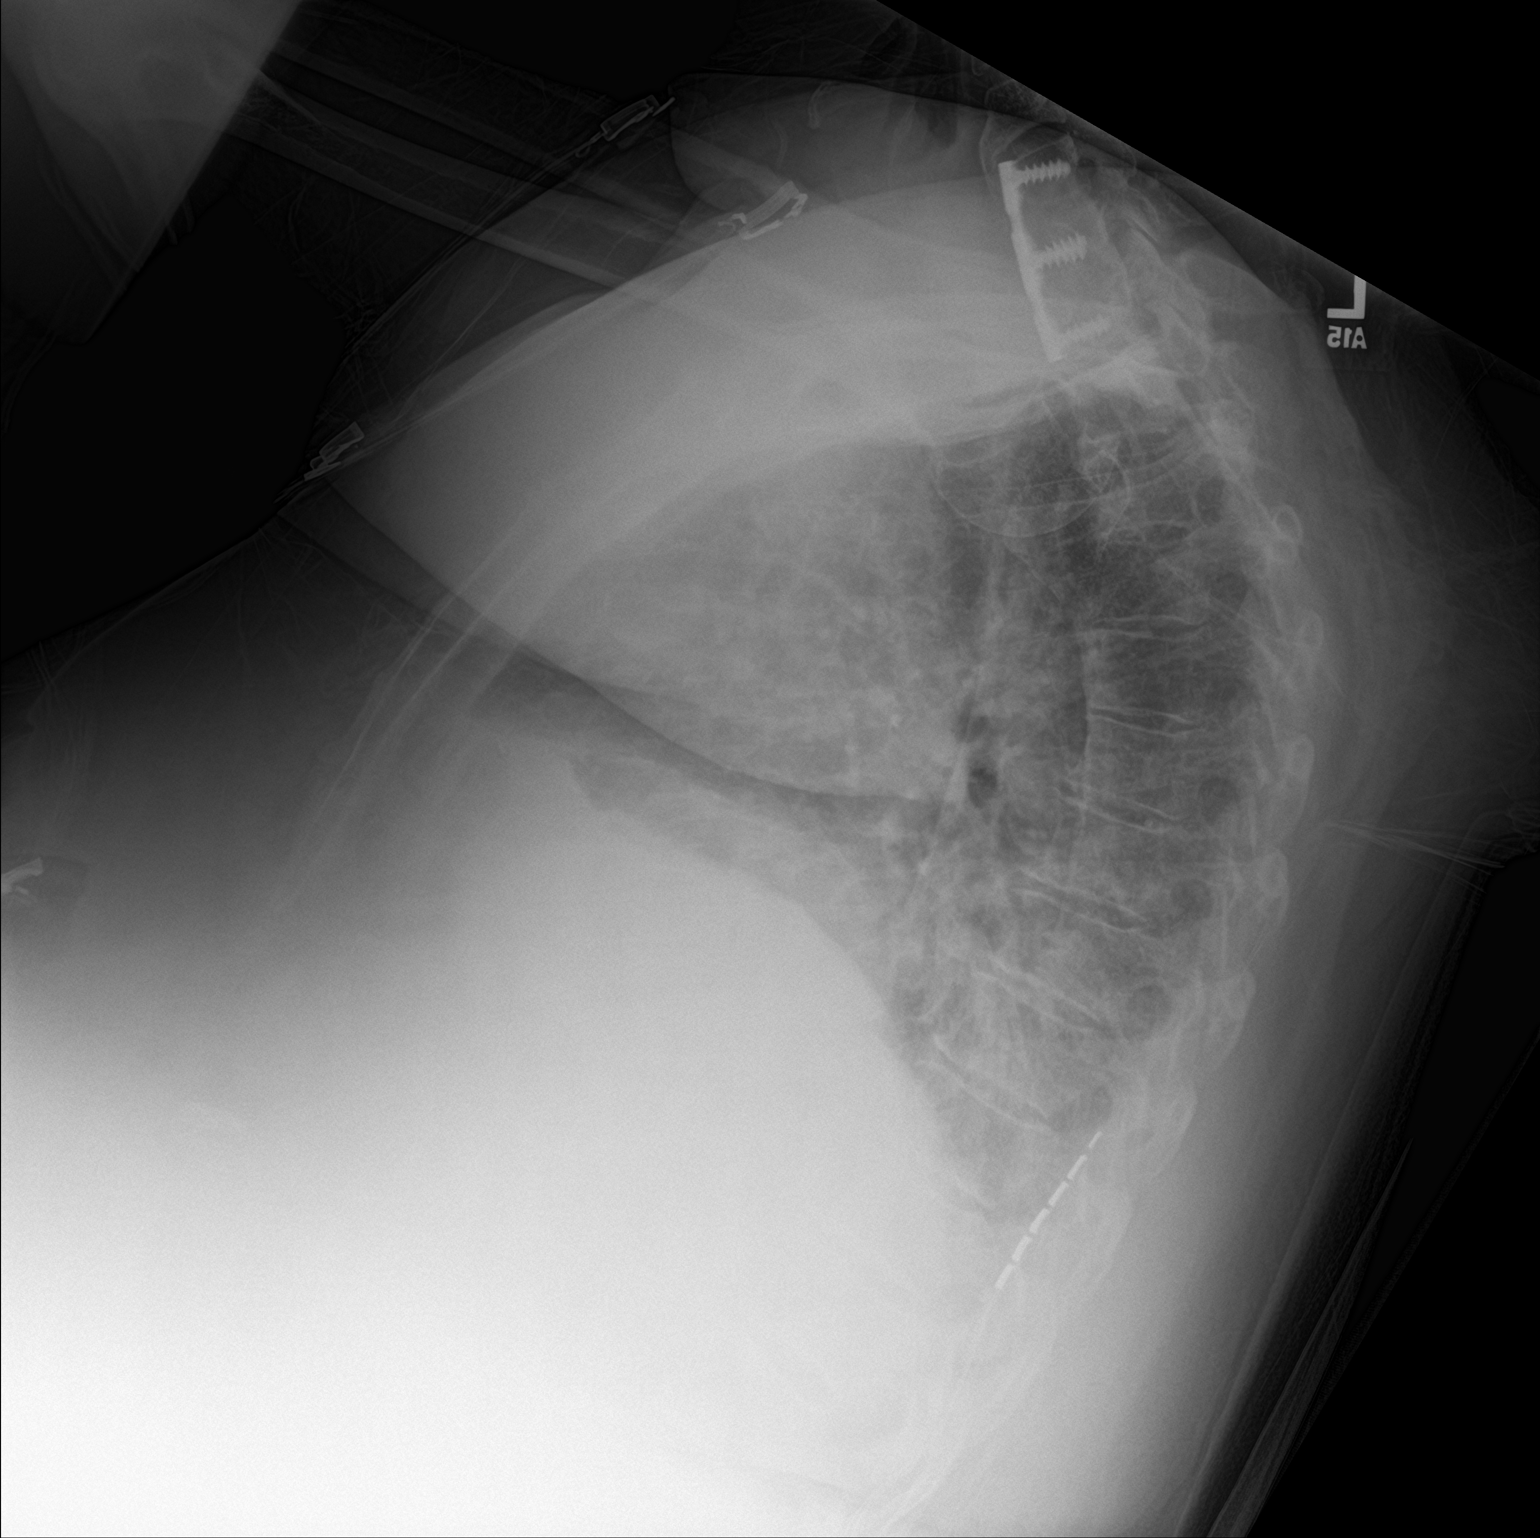

[2 of 2 positions shown; findings below may reference images not displayed]

FINDINGS: Cardiomegaly with mild bilateral pulmonary interstitial prominence.
Mild chest and heart failure and/or pneumonitis cannot be excluded.
Prior cervical thoracic fusion. Neurostimulator noted .
IMPRESSION: Cardiomegaly with bilateral mild pulmonary interstitial prominence.
Mild congestive heart failure and/or pneumonitis cannot be excluded.
Low lung volumes.

## 2016-11-23 IMAGING — CT CT ANGIO CHEST
2 of 6 series · 18 of 46 positions shown · IV contrast (APPLIED)
Comparison: CT same day.  CT angiography 12/09/2015.

CLINICAL DATA: Cough and chest pain and pressure beginning last
night. History of aortic dissection previously.

EXAM:
CT ANGIOGRAPHY CHEST WITH CONTRAST
TECHNIQUE: Multidetector CT imaging of the chest was performed using the
standard protocol during bolus administration of intravenous
contrast. Multiplanar CT image reconstructions and MIPs were
obtained to evaluate the vascular anatomy.
CONTRAST:  75 cc Isovue 370

[Series 5: thins · axial · 0.66mm/px · z∈[-813,-600]mm · 16 of 235 slices shown]
[im 11/235  lung]
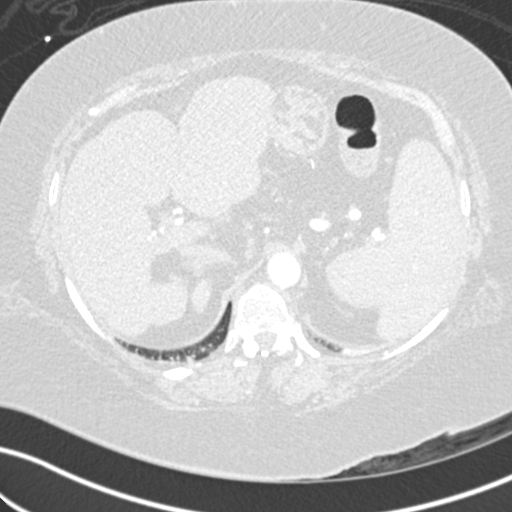
[im 31/235  soft-tissue]
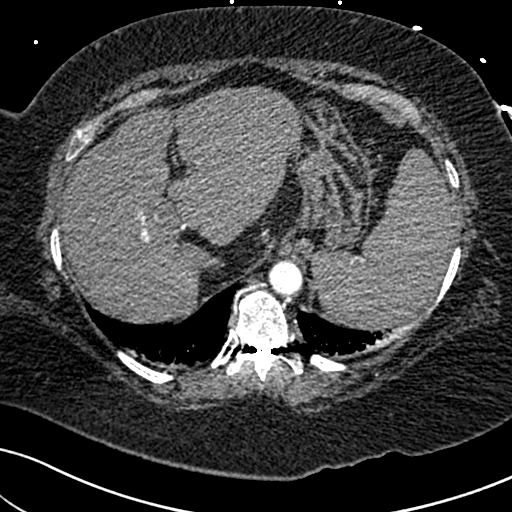
[im 41/235  lung]
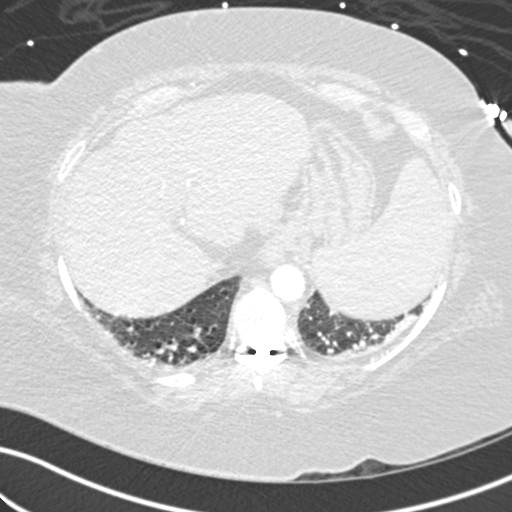
[im 51/235  soft-tissue]
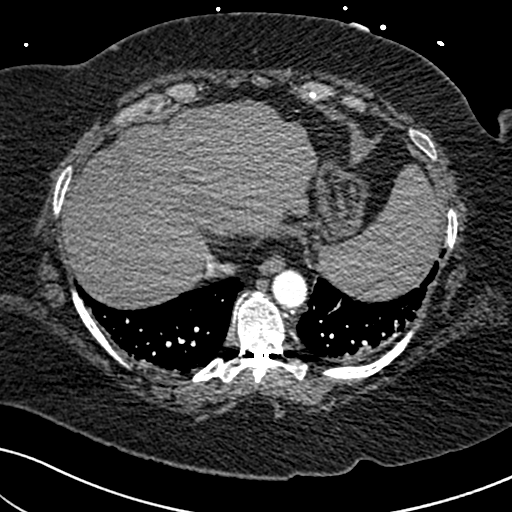
[im 72/235  lung]
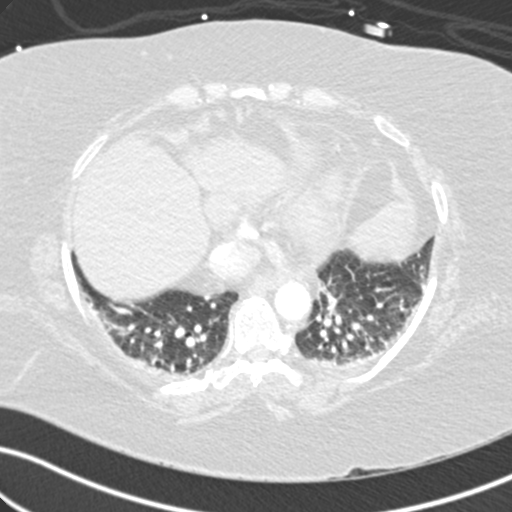
[im 82/235  soft-tissue]
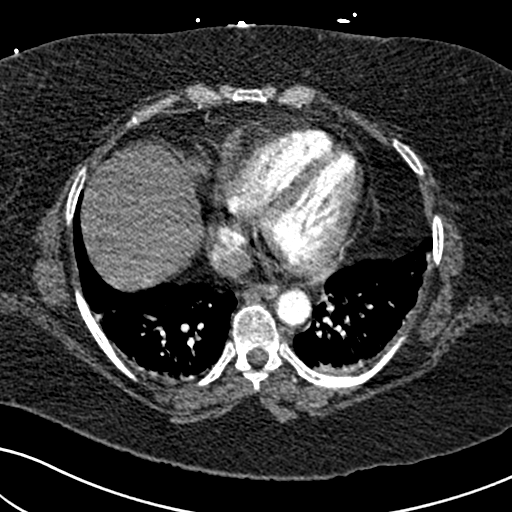
[im 92/235  lung]
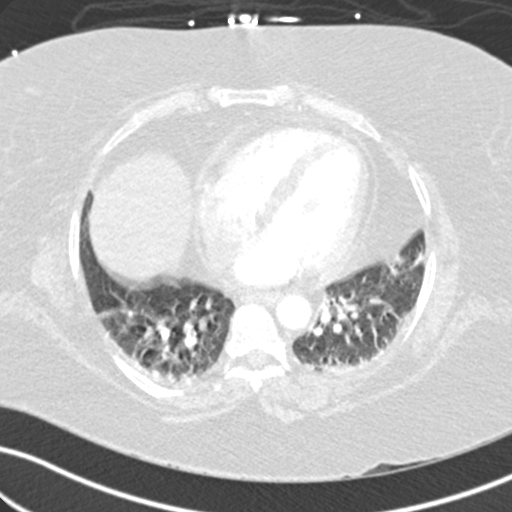
[im 112/235  soft-tissue]
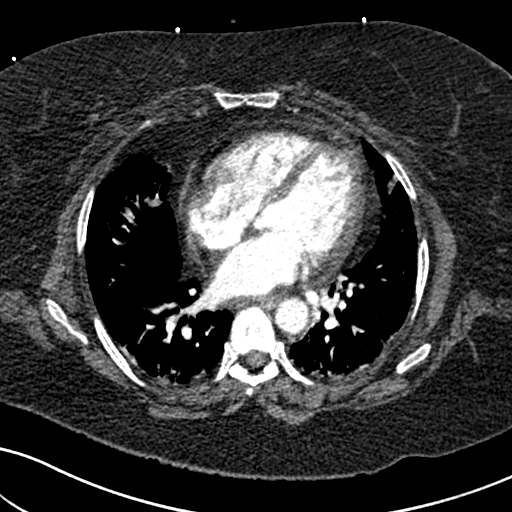
[im 123/235  lung]
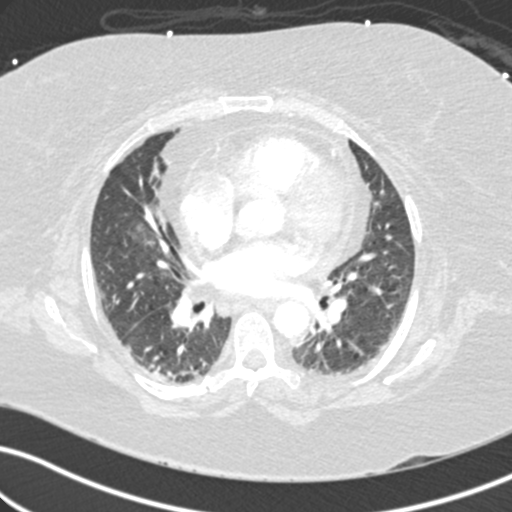
[im 143/235  soft-tissue]
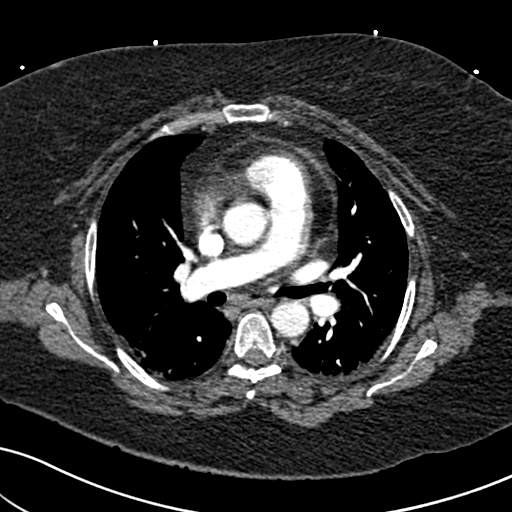
[im 153/235  lung]
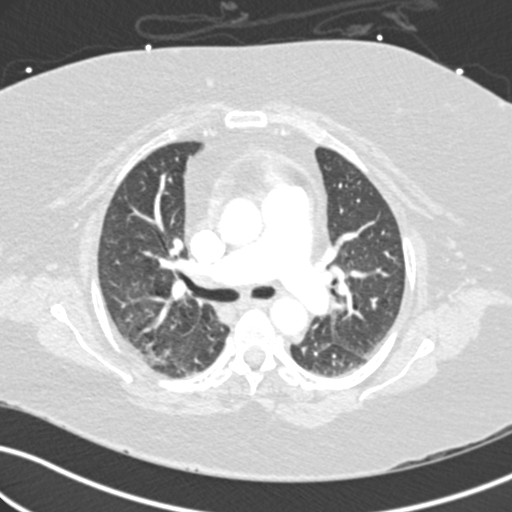
[im 163/235  soft-tissue]
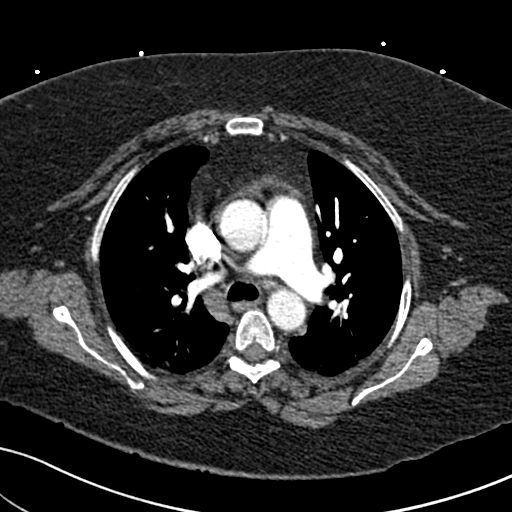
[im 184/235  lung]
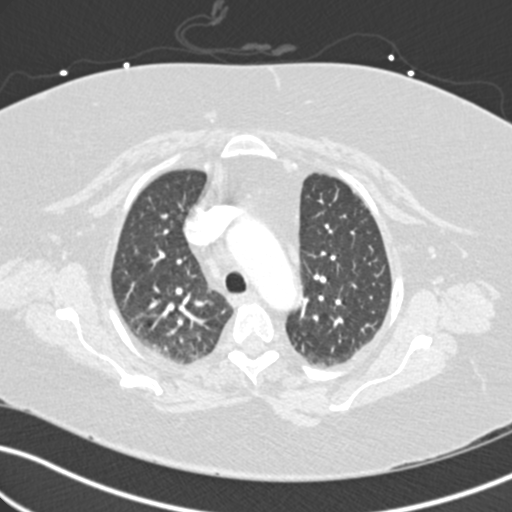
[im 194/235  soft-tissue]
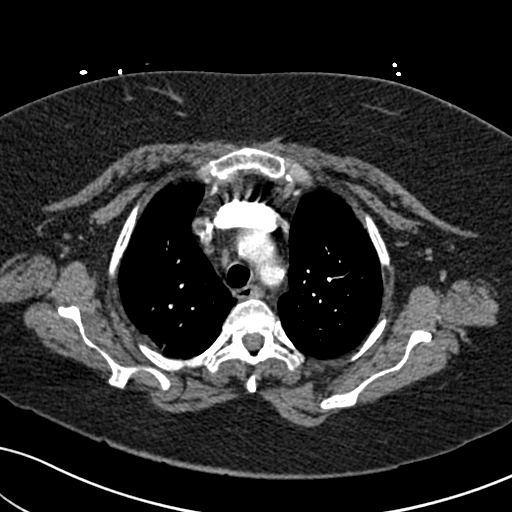
[im 204/235  lung]
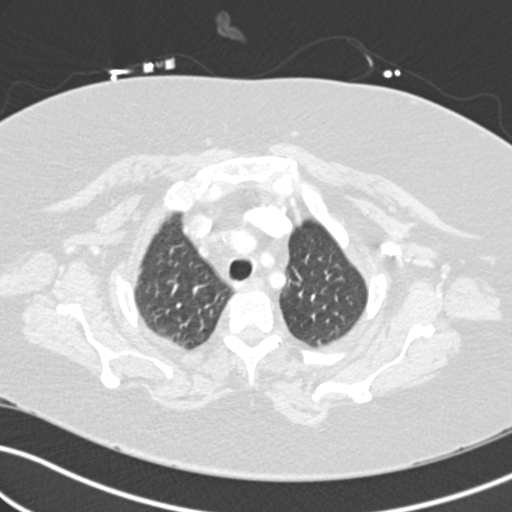
[im 224/235  soft-tissue]
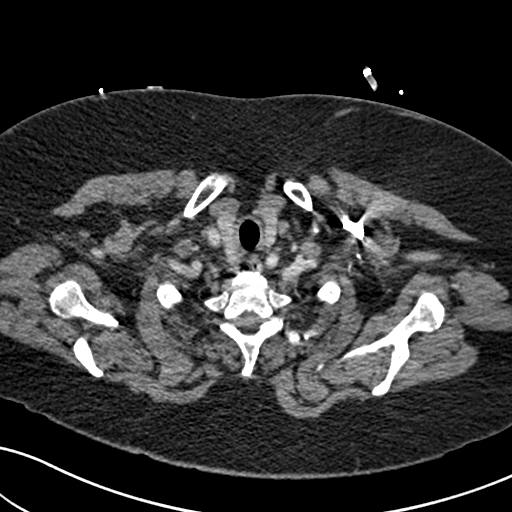

[Series 7: coronal mpr · coronal · 0.49mm/px · 2 of 83 slices shown]
[im 28/83  soft-tissue]
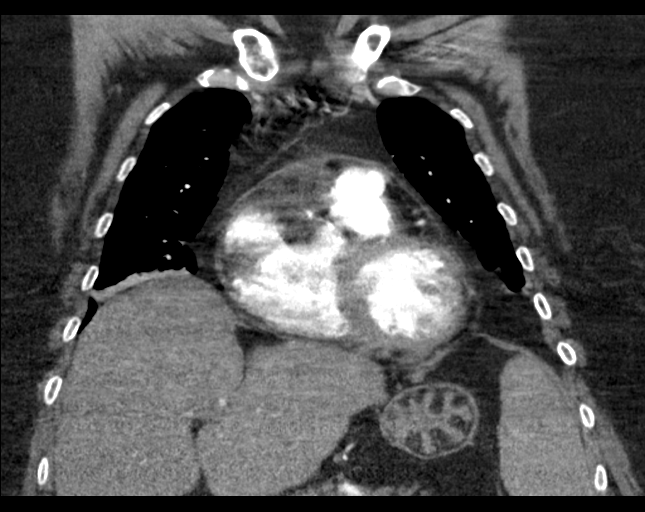
[im 55/83  soft-tissue]
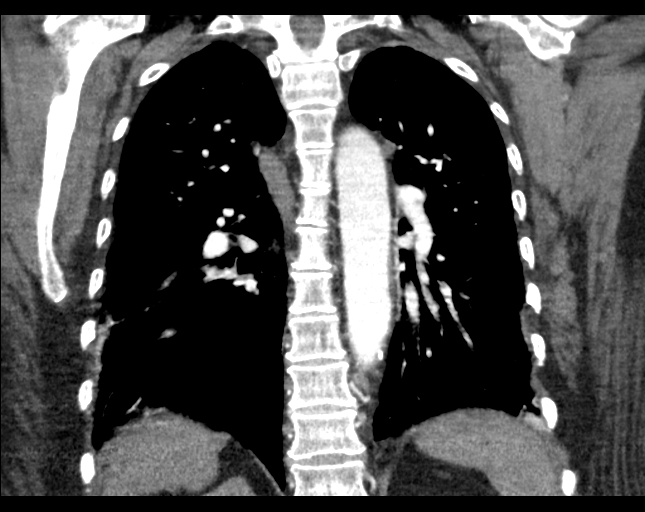

[18 of 46 positions shown; findings below may reference images not displayed]

FINDINGS: Cardiovascular: Chronic cardiomegaly. Small pericardial effusion.
Extensive coronary artery calcification. Thoracic aortic
atherosclerosis but no evidence of aneurysm or dissection. Pulmonary
arterial opacification is good and there are no pulmonary emboli.

Mediastinum/Nodes: No enlarged hilar or mediastinal lymph nodes. No
mass.

Lungs/Pleura: The lungs are clear.  No pleural fluid.

Upper Abdomen: Chronic cirrhosis of the liver with portal venous
hypertension and splenomegaly.

Musculoskeletal: Chronic spinal neurostimulator. Ordinary thoracic
spondylosis.

Review of the MIP images confirms the above findings.
IMPRESSION: No acute vascular pathology identifiable by CT. No evidence of
aortic dissection or pulmonary emboli.

Aortic and coronary artery atherosclerosis.

Cardiomegaly.

Small pericardial effusion.

Cirrhosis and portal venous hypertension.

## 2016-11-23 MED ORDER — VANCOMYCIN HCL 10 G IV SOLR
1250.0000 mg | Freq: Two times a day (BID) | INTRAVENOUS | Status: DC
  Administered 2016-11-23: 1250 mg via INTRAVENOUS
  Filled 2016-11-23: qty 1250

## 2016-11-23 MED ORDER — CEPHALEXIN 500 MG PO CAPS: 500.0000 mg | ORAL_CAPSULE | Freq: Two times a day (BID) | ORAL | 0 refills | Status: DC

## 2016-11-23 MED ORDER — CEPHALEXIN 500 MG PO CAPS
500.0000 mg | ORAL_CAPSULE | Freq: Once | ORAL | Status: AC
  Administered 2016-11-23: 500 mg via ORAL
  Filled 2016-11-23: qty 1

## 2016-11-23 MED ORDER — ONDANSETRON HCL 4 MG/2ML IJ SOLN
4.0000 mg | Freq: Once | INTRAMUSCULAR | Status: AC
  Administered 2016-11-23: 4 mg via INTRAVENOUS
  Filled 2016-11-23: qty 2

## 2016-11-23 MED ORDER — VANCOMYCIN HCL IN DEXTROSE 1-5 GM/200ML-% IV SOLN
1000.0000 mg | Freq: Once | INTRAVENOUS | Status: AC
  Administered 2016-11-23: 1000 mg via INTRAVENOUS
  Filled 2016-11-23: qty 200

## 2016-11-23 MED ORDER — MORPHINE SULFATE (PF) 4 MG/ML IV SOLN
INTRAVENOUS | Status: AC
  Filled 2016-11-23: qty 1

## 2016-11-23 MED ORDER — MORPHINE SULFATE (PF) 4 MG/ML IV SOLN
4.0000 mg | Freq: Once | INTRAVENOUS | Status: AC
  Administered 2016-11-23: 4 mg via INTRAVENOUS
  Filled 2016-11-23: qty 1

## 2016-11-23 MED ORDER — MORPHINE SULFATE (PF) 4 MG/ML IV SOLN
4.0000 mg | Freq: Once | INTRAVENOUS | Status: AC
  Administered 2016-11-23: 4 mg via INTRAVENOUS

## 2016-11-23 MED ORDER — LIDOCAINE HCL (PF) 1 % IJ SOLN
5.0000 mL | Freq: Once | INTRAMUSCULAR | Status: AC
  Administered 2016-11-23: 5 mL via INTRADERMAL
  Filled 2016-11-23: qty 5

## 2016-11-23 NOTE — ED Notes (Signed)
Spoke with [place] at the [facility]. She stated when the driver comes in they will come pick pt up.

## 2016-11-23 NOTE — ED Provider Notes (Signed)
[facility] Emergency Department Provider Note  ____________________________________________  Time seen: Approximately 4:30 AM  I have reviewed the triage vital signs and the nursing notes.   HISTORY  Chief Complaint Knee Pain and Post-op Problem    HPI Stephanie [name] is a 66 y.o. female who complains of right knee pain with swelling worsening over the past 3-4 days.She had surgeries 6 days ago with an arthroscopy of the right knee and lateral release. Denies any recent falls or injuries since then. Denies fever chills chest pain shortness of breath. Does feel that the right leg is somewhat swollen but mostly around the right knee. Hurts to bend the knee. Pain is constant worse with motion, no alleviating factors. Severe. Feels sharp.     Past Medical History:  Diagnosis Date  . Adenomatous colon polyp 08/27/2014   Polyps x 3  . Anemia   . Anxiety   . 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 Arthritis   . Asthma   . Bipolar affective disorder ([place])    h/o  . Blood transfusion 2000  . Blood transfusion without reported diagnosis   . Chronic abdominal pain    abdominal wall pain  . 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 Colon polyp   . Depression   . Diabetes mellitus without complication ([place])   . Fatty liver 04/09/08   found in abd CT  . GERD (gastroesophageal reflux disease)   . H/O: CVA (cardiovascular accident)    TIA  . H/O: rheumatic fever   . Hepatitis    HEP "B" twenty years ago  . Hyperlipidemia   . Incontinence   . Insomnia   . Lower extremity edema   . Obstructive sleep apnea    Use C-PAP twice per week  . Platelets decreased ([place])   . RLS (restless legs syndrome)   . Shortness of breath   . Sleep apnea   . Stroke [facility])    TIA- 2002  . Ulcer   . Unspecified disorders of nervous system      Patient Active Problem List   Diagnosis Date Noted  . Encephalopathy 07/19/2016  . Lymphedema 06/14/2016  . Pain in limb  05/06/2016  . Swelling of limb 05/06/2016  . Postphlebitic syndrome with inflammation 05/06/2016  . Pneumonia 04/18/2016  . Chest pain 04/04/2016  . Iron deficiency anemia 01/30/2016  . Hypoglycemia 09/09/2015  . Hyperglycemia 07/16/2015  . Neuropathy 06/08/2015  . Thrombocytopenia ([place]) 08/19/2014  . Abdominal pain, chronic, epigastric 06/12/2014  . Liver cirrhosis secondary to NASH ([place]) 02/27/2014  . Dizziness and giddiness 10/30/2013  . DOE (dyspnea on exertion) 10/30/2013  . Poorly controlled type 2 diabetes mellitus with complication ([place]) 67/61/9509  . DNR (do not resuscitate) 07/29/2013  . Encounter for routine gynecological examination 07/29/2013  . Dyspnea 07/10/2013  . Lung nodule 07/10/2013  . Varicose veins of left lower extremity 10/09/2012  . GERD (gastroesophageal reflux disease) 10/09/2012  . Cirrhosis of liver ([place]) 02/02/2012  . Anxiety 07/11/2011  . Aortic dissection ([place])   . BACK PAIN, LUMBAR, CHRONIC 11/19/2009  . UNS ADVRS EFF OTH RX MEDICINAL&BIOLOGICAL SBSTNC 12/16/2008  . UNSPECIFIED VITAMIN D DEFICIENCY 09/26/2008  . TOBACCO ABUSE 06/19/2008  . RESTLESS LEGS SYNDROME 04/27/2007  . INSOMNIA 04/27/2007  . HLD (hyperlipidemia) 03/08/2007  . OSA (obstructive sleep apnea) 03/08/2007  . Essential hypertension 03/08/2007  . Asthma 03/08/2007  . BIPOLAR AFFECTIVE DISORDER, HX OF 03/08/2007  Past Surgical History:  Procedure Laterality Date  . ABDOMINAL EXPLORATION SURGERY    . ABDOMINAL HYSTERECTOMY     total  . Axillary artery cannulation via 8-mm Hemashield graft, median sternotomy, extracorporeal circulation with deep hypothermic circulatory arrest, repair of  aortic dessection  01/23/2009   Dr Arlyce Dice  . BACK SURGERY      x 5  . cataract     both eyes  . CHOLECYSTECTOMY    . COLONOSCOPY    . EYE SURGERY Bilateral    Cataract Extraction with IOL  . HEMORROIDECTOMY     and colon polyp removed  . KNEE ARTHROSCOPY WITH LATERAL RELEASE Right  11/15/2016   Procedure: KNEE ARTHROSCOPY WITH LATERAL RELEASE;  Surgeon: Corky Mull, MD;  Location: [facility] ORS;  Service: Orthopedics;  Laterality: Right;  . LIVER BIOPSY  04/10/2012   Procedure: LIVER BIOPSY;  Surgeon: Inda Castle, MD;  Location: [facility];  Service: Endoscopy;  Laterality: N/A;  ultrasound to mark order for abd limited/liver to be marked to be sent by [email or web address]   . LUMBAR FUSION  10/09  . LUMBAR WOUND DEBRIDEMENT  05/09/2011   Procedure: LUMBAR WOUND DEBRIDEMENT;  Surgeon: Dahlia Bailiff;  Location: [place];  Service: Orthopedics;  Laterality: N/A;  IRRIGATION AND DEBRIDEMENT SPINAL WOUND  . POSTERIOR CERVICAL FUSION/FORAMINOTOMY    . ROTATOR CUFF REPAIR     left     Prior to Admission medications   Medication Sig Start Date End Date Taking? Authorizing Provider  acetaminophen (TYLENOL) 325 MG tablet Take 650 mg by mouth every 4 (four) hours as needed (for fever, minor discomfort, or headache).    Yes [provider]  albuterol (PROVENTIL HFA;VENTOLIN HFA) 108 (90 Base) MCG/ACT inhaler Inhale 2 puffs into the lungs every 6 (six) hours as needed for wheezing or shortness of breath. 07/28/16  Yes Alfred Levins, [place], MD  albuterol (PROVENTIL) (2.5 MG/3ML) 0.083% nebulizer solution Take 2.5 mg by nebulization every 6 (six) hours as needed for wheezing or shortness of breath.   Yes [provider]  alum & mag hydroxide-simeth (ANTACID LIQUID) 200-200-20 MG/5ML suspension Take 30 mLs by mouth every 6 (six) hours as needed for indigestion or heartburn. *NOTIFY MD IF SYMPTOMS PERSISTS FOR MORE THAN 3 DAYS.*   Yes [provider]  ARIPiprazole (ABILIFY) 30 MG tablet Take 30 mg by mouth daily.   Yes [provider]  barrier cream (NON-SPECIFIED) CREA Apply 1 application topically as needed (APPLIED AFTER TOILETING TO PREVENT SKIN BREAKDOWN).   Yes [provider]  budesonide-formoterol (SYMBICORT) 160-4.5 MCG/ACT inhaler Inhale 2 puffs  into the lungs 2 (two) times daily. (0800 & 2000)   Yes [provider]  buPROPion (WELLBUTRIN SR) 150 MG 12 hr tablet Take 150 mg by mouth daily. (0800)   Yes [provider]  clonazePAM (KLONOPIN) 0.5 MG tablet Take 0.25 mg by mouth 3 (three) times daily. (0800, 1300 & 2000)   Yes [provider]  dicyclomine (BENTYL) 10 MG capsule Take 10 mg by mouth 3 (three) times daily. (0800, 1300 & 2000)   Yes [provider]  diphenhydrAMINE (DIPHENHIST) 25 mg capsule Take 25 mg by mouth every 6 (six) hours as needed (for allergic reaction (notify provider is symptoms persist more than 24 hrs*).   Yes [provider]  Emollient (EUCERIN INTENSIVE REPAIR) LOTN Apply 1 application topically 2 (two) times daily.   Yes [provider]  ferrous sulfate 325 (65 FE) MG tablet Take 325 mg  by mouth 2 (two) times daily. (0800 & 2000)   Yes [provider]  furosemide (LASIX) 80 MG tablet Take 80 mg by mouth daily. (0800)   Yes [provider]  gabapentin (NEURONTIN) 600 MG tablet Take 600 mg by mouth 2 (two) times daily. (0800 & 2000)   Yes [provider]  guaifenesin (ROBITUSSIN) 100 MG/5ML syrup Take 300 mg by mouth every 6 (six) hours as needed for cough.    Yes [provider]  insulin lispro (HUMALOG KWIKPEN) 100 UNIT/ML KiwkPen Inject 0-9 Units into the skin 3 (three) times daily. Check FSBS before each meal & cover with SSI (sliding scale insulin)   Yes [provider]  lactulose (CHRONULAC) 10 GM/15ML solution Take 10 g by mouth 3 (three) times a week. 10/17/16  Yes [provider]  lamoTRIgine (LAMICTAL) 100 MG tablet Take 100 mg by mouth 2 (two) times daily. (0800 & 2000)   Yes [provider]  loperamide (IMODIUM) 2 MG capsule Take 4 mg by mouth as needed for diarrhea or loose stools. *Do not exceed 8 doses in 24 hours*   Yes [provider]  magnesium hydroxide (MILK OF MAGNESIA) 400  MG/5ML suspension Take 30 mLs by mouth daily as needed (for constipation).   Yes [provider]  Menthol-Zinc Oxide (GOLD BOND EX) Apply 1 application topically daily.   Yes [provider]  metoCLOPramide (REGLAN) 5 MG tablet Take 5 mg by mouth 4 (four) times daily -  before meals and at bedtime. (0800, 1200, 1600 & 2000)   Yes [provider]  mirtazapine (REMERON) 15 MG tablet Take 30 mg by mouth at bedtime. (2000)    Yes [provider]  omeprazole (PRILOSEC) 20 MG capsule Take 20 mg by mouth daily before breakfast. (0630)   Yes [provider]  oxybutynin (DITROPAN-XL) 5 MG 24 hr tablet Take 5 mg by mouth daily. (0800)   Yes [provider]  oxyCODONE (ROXICODONE) 5 MG immediate release tablet Take 1 tablet (5 mg total) by mouth every 4 (four) hours as needed for severe pain. 11/15/16  Yes Poggi, Marshall Cork, MD  potassium chloride SA (K-DUR,KLOR-CON) 20 MEQ tablet Take 20 mEq by mouth daily. (0800)   Yes [provider]  rOPINIRole (REQUIP) 3 MG tablet Take 3 mg by mouth at bedtime. (2000)   Yes [provider]  simvastatin (ZOCOR) 10 MG tablet Take 10 mg by mouth at bedtime. (2000)   Yes [provider]  spironolactone (ALDACTONE) 25 MG tablet Take 25 mg by mouth daily. (0800)   Yes [provider]  traMADol (ULTRAM) 50 MG tablet Take 50 mg by mouth 2 (two) times daily. (0800 & 2000)   Yes [provider]  vitamin C (ASCORBIC ACID) 500 MG tablet Take 500 mg by mouth 2 (two) times daily. (0800 & 2000)   Yes [provider]  Water For Irrigation, Sterile (STERILE WATER FOR IRRIGATION) Irrigate with as directed at bedtime. Use with CPAP machine at bedtime during sleep & remove in the morning   Yes [provider]  zolpidem (AMBIEN) 5 MG tablet Take 5 mg by mouth at bedtime. (2000)   Yes [provider]  cephALEXin (KEFLEX) 500 MG capsule Take 1 capsule (500 mg total) by mouth 2  (two) times daily. 11/23/16   Carrie Mew, MD  LORazepam (ATIVAN) 0.5 MG tablet Take 1 tablet (0.5 mg total) by mouth daily as needed for anxiety. Patient not taking:  Reported on 11/23/2016 07/21/16   Dustin Flock, MD  pyrithione zinc (HEAD AND SHOULDERS) 1 % shampoo Apply 1 application topically daily. t-gel or head and shoulders.    [provider]     Allergies Doxycycline   Family History  Problem Relation Age of Onset  . Breast cancer Mother   . Lung cancer Mother   . Stomach cancer Father   . Diabetes Unknown        4 aunts, and 1 uncle  . Colon cancer Neg Hx   . Esophageal cancer Neg Hx   . Rectal cancer Neg Hx     Social History Social History  Substance Use Topics  . Smoking status: Former Smoker    Packs/day: 1.50    Years: 50.00    Types: Cigarettes    Quit date: 04/06/2013  . Smokeless tobacco: Never Used  . Alcohol use No    Review of Systems  Constitutional:   No fever or chills.  ENT:   No sore throat. No rhinorrhea. Cardiovascular:   No chest pain or syncope. Respiratory:   No dyspnea or cough. Gastrointestinal:   Negative for abdominal pain, vomiting and diarrhea.  Musculoskeletal:   Right knee pain as above All other systems reviewed and are negative except as documented above in ROS and HPI.  ____________________________________________   PHYSICAL EXAM:  VITAL SIGNS: ED Triage Vitals  Enc Vitals Group     BP 11/23/16 0105 (!) 156/70     Pulse Rate 11/23/16 0105 88     Resp 11/23/16 0105 19     Temp 11/23/16 0105 97.9 F (36.6 C)     Temp Source 11/23/16 0105 Oral     SpO2 11/23/16 0105 95 %     Weight 11/23/16 0103 222 lb (100.7 kg)     Height 11/23/16 0103 5' 2"  (1.575 m)     Head Circumference --      Peak Flow --      Pain Score 11/23/16 0103 10     Pain Loc --      Pain Edu? --      Excl. in [place]? --     Vital signs reviewed, nursing assessments reviewed.   Constitutional:   Alert and oriented. Well appearing  and in no distress. Eyes:   No scleral icterus.  EOMI. No nystagmus. No conjunctival pallor. PERRL. ENT   Head:   Normocephalic and atraumatic.   Nose:   No congestion/rhinnorhea.    Mouth/Throat:   MMM, no pharyngeal erythema. No peritonsillar mass.    Neck:   No meningismus. Full ROM Hematological/Lymphatic/Immunilogical:   No cervical lymphadenopathy. Cardiovascular:   RRR. Symmetric bilateral radial and DP pulses.  No murmurs.  Respiratory:   Normal respiratory effort without tachypnea/retractions. Breath sounds are clear and equal bilaterally. No wheezes/rales/rhonchi. Gastrointestinal:   Soft and nontender. Non distended. There is no CVA tenderness.  No rebound, rigidity, or guarding. Genitourinary:   deferred Musculoskeletal:   Pain with range of motion of right knee. Clinically obvious right knee effusion. With tenderness at the joint space and with manipulation of the effusion.  .  No edema.Symmetric Circumference. No thigh tenderness. No palpable cords. Negative Homans sign. No calf tenderness. Neurologic:   Normal speech and language.  Motor grossly intact. No gross focal neurologic deficits are appreciated.  Skin:    Skin is warm, dry and intact. No rash noted.  No petechiae, purpura, or bullae.  ____________________________________________    LABS (pertinent positives/negatives) (all labs  ordered are listed, but only abnormal results are displayed) Labs Reviewed  COMPREHENSIVE METABOLIC PANEL - Abnormal; Notable for the following:       Result Value   Glucose, Bld 240 (*)    Alkaline Phosphatase 212 (*)    All other components within normal limits  APTT - Abnormal; Notable for the following:    aPTT 38 (*)    All other components within normal limits  URINALYSIS, COMPLETE (UACMP) WITH MICROSCOPIC - Abnormal; Notable for the following:    APPearance HAZY (*)    Nitrite POSITIVE (*)    Leukocytes, UA TRACE (*)    Squamous Epithelial / LPF 0-5 (*)     Bacteria, UA RARE (*)    All other components within normal limits  CBC WITH DIFFERENTIAL/PLATELET - Abnormal; Notable for the following:    RDW 15.9 (*)    Platelets 135 (*)    Lymphs Abs 0.8 (*)    All other components within normal limits  SYNOVIAL CELL COUNT + DIFF, W/ CRYSTALS - Abnormal; Notable for the following:    Color, Synovial RED (*)    Appearance-Synovial TURBID (*)    WBC, Synovial 879 (*)    All other components within normal limits  CULTURE, BLOOD (ROUTINE X 2)  CULTURE, BLOOD (ROUTINE X 2)  URINE CULTURE  BODY FLUID CULTURE  LACTIC ACID, PLASMA  PROCALCITONIN  PROTIME-INR  LACTIC ACID, PLASMA  GLUCOSE, SYNOVIAL FLUID  PROTEIN, SYNOVIAL FLUID  URIC ACID, SYNOVIAL FLUID   ____________________________________________   EKG    ____________________________________________    RADIOLOGY  US Venous Img Lower Unilateral Right  Result Date: 11/23/2016 CLINICAL DATA:  Initial evaluation for acute right leg pain, swelling. Recent knee surgery. EXAM: Right LOWER EXTREMITY VENOUS DOPPLER ULTRASOUND TECHNIQUE: Gray-scale sonography with graded compression, as well as color Doppler and duplex ultrasound were performed to evaluate the lower extremity deep venous systems from the level of the common femoral vein and including the common femoral, femoral, profunda femoral, popliteal and calf veins including the posterior tibial, peroneal and gastrocnemius veins when visible. The superficial great saphenous vein was also interrogated. Spectral Doppler was utilized to evaluate flow at rest and with distal augmentation maneuvers in the common femoral, femoral and popliteal veins. COMPARISON:  None. FINDINGS: Contralateral Common Femoral Vein: Respiratory phasicity is normal and symmetric with the symptomatic side. No evidence of thrombus. Normal compressibility. Common Femoral Vein: No evidence of thrombus. Normal compressibility, respiratory phasicity and response to augmentation.  Saphenofemoral Junction: No evidence of thrombus. Normal compressibility and flow on color Doppler imaging. Profunda Femoral Vein: No evidence of thrombus. Normal compressibility and flow on color Doppler imaging. Femoral Vein: No evidence of thrombus. Normal compressibility, respiratory phasicity and response to augmentation. Popliteal Vein: No evidence of thrombus. Normal compressibility, respiratory phasicity and response to augmentation. Calf Veins: No evidence of thrombus. Normal compressibility and flow on color Doppler imaging. Superficial Great Saphenous Vein: No evidence of thrombus. Normal compressibility and flow on color Doppler imaging. Venous Reflux:  None. Other Findings:  None. IMPRESSION: No evidence of DVT within the right lower extremity. Electronically Signed   By: Jeannine Boga M.D.   On: 11/23/2016 05:30   Dg Knee Complete 4 Views Right  Result Date: 11/23/2016 CLINICAL DATA:  66 year old female with recent surgery presenting with right knee pain and swelling. EXAM: RIGHT KNEE - COMPLETE 4+ VIEW COMPARISON:  Right knee radiograph dated 07/14/2016 FINDINGS: There is no acute fracture or dislocation. The bones are osteopenic. There is  mild osteoarthritic changes of the knee with bone spurring. Meniscal chondrocalcinosis noted. There is a moderate size suprapatellar effusion which is increased in size compared to the prior radiograph. There is diffuse subcutaneous edema with thickening of the skin over the knee. There is overall increased swelling of the knee compared to the prior radiograph. Correlation with clinical exam is recommended to exclude an infectious process. IMPRESSION: 1. Moderate suprapatellar effusion increased compared to the prior radiograph. Clinical correlation is recommended to exclude an infectious process. 2. No acute fracture or dislocation. 3. Mild osteoarthritic changes. Electronically Signed   By: Anner Crete M.D.   On: 11/23/2016 02:35     ____________________________________________   PROCEDURES Procedures ARTHOCENTESIS Performed by: Carrie Mew Consent: Verbal consent obtained. Risks and benefits: risks, benefits and alternatives were discussed Consent given by: patient Required items: required blood products, implants, devices, and special equipment available Patient identity confirmed: verbally with patient Time out: Immediately prior to procedure a "time out" was called to verify the correct patient, procedure, equipment, support staff and site/side marked as required. Indications: Suspected septic arthritis  Joint: Right knee Local anesthesia used: 1% lidocaine without epinephrine  Preparation: Patient was prepped and draped in the usual sterile fashion. Aspirate appearance: Bloody  Aspirate amount: 40 ml Patient tolerance: Patient tolerated the procedure well with no immediate complications.    ____________________________________________   INITIAL IMPRESSION / ASSESSMENT AND PLAN / ED COURSE  Pertinent labs & imaging results that were available during my care of the patient were reviewed by me and considered in my medical decision making (see chart for details).  Patient presents with right knee pain x-ray unremarkable, serum labs unremarkable. Arthrocentesis completed to evaluate for possible infection. Patient has received 3 doses of morphine for pain control so far. While waiting for labs also obtain ultrasound right lower extremity. Vancomycin ordered given clinical concern for septic arthritis.     ----------------------------------------- 6:54 AM on 11/23/2016 -----------------------------------------  Synovial fluid analysis is not consistent with infection. Pain improved after arthrocentesis. Urinalysis does reveal a UTI. Urine culture sent. Start Keflex. Follow up with primary care and orthopedics. Vital signs normal, no evidence of sepsis or pyelonephritis. No soft tissue infection.  Ultrasound negative for DVT. ____________________________________________   FINAL CLINICAL IMPRESSION(S) / ED DIAGNOSES  Final diagnoses:  Post-operative pain  Effusion of right knee  Acute lower UTI (urinary tract infection)      New Prescriptions   CEPHALEXIN (KEFLEX) 500 MG CAPSULE    Take 1 capsule (500 mg total) by mouth 2 (two) times daily.     Portions of this note were generated with dragon dictation software. Dictation errors may occur despite best attempts at proofreading.    Carrie Mew, MD 11/23/16 [phone]

## 2016-11-23 NOTE — ED Triage Notes (Signed)
Patient comes in from the [facility] via [facility] with knee pain that is not controlled with her prescribed pain medication. Per EMS patient had knee surgery last week and she had some pain last night but the medication helped. The pain tonight is not relieved with medication. Patients right knee is warm to the touch. No drainage at wound site. Last pain medication was at 2000.

## 2016-11-23 NOTE — Progress Notes (Signed)
Pharmacy Antibiotic Note  Stephanie [name] is a 66 y.o. female admitted on 11/23/2016 with cellulitis.  Pharmacy has been consulted for vancomycin dosing.  Plan: Patient received vanc 1g IV x 1 in ED  Will follow up w/ vanc 1.25g IV q12h w/ 6 hour stack dose. Will draw vanc trough 6/21 @ 1900 prior to 4th dose. Ke 0.0658 T1/2 12 hours Goal trough 10 - 15 mcg/mL  Height: 5' 2"  (157.5 cm) Weight: 222 lb (100.7 kg) IBW/kg (Calculated) : 50.1  Temp (24hrs), Avg:97.9 F (36.6 C), Min:97.9 F (36.6 C), Max:97.9 F (36.6 C)   Recent Labs Lab 11/23/16 0111  WBC 6.4  CREATININE 0.83  LATICACIDVEN 1.2    Estimated Creatinine Clearance: 74 mL/min (by C-G formula based on SCr of 0.83 mg/dL).    Allergies  Allergen Reactions  . Doxycycline Hives, Swelling and Other (See Comments)    Reaction:  Facial swelling      Thank you for allowing pharmacy to be a part of this patient's care.  Tobie Lords, PharmD, BCPS Clinical Pharmacist 11/23/2016

## 2016-11-26 LAB — URINE CULTURE

## 2016-11-27 NOTE — Progress Notes (Signed)
66 y/o F s/p recent knee surgery d/c from ED with post-operative pain and UTI. Patient was prescribed Keflex. Urine culture resulted with ESBL. Organism is sensitive to nitrofurantoin and patient's renal function is normal. Spoke to med tech at [facility], and faxed results of urine culture to [phone].   Ulice Dash, PharmD Clinical Pharmacist

## 2016-11-28 LAB — CULTURE, BODY FLUID-BOTTLE: CULTURE: NO GROWTH

## 2016-11-28 LAB — CULTURE, BLOOD (ROUTINE X 2)
CULTURE: NO GROWTH
Culture: NO GROWTH
SPECIAL REQUESTS: ADEQUATE

## 2016-11-28 LAB — CULTURE, BODY FLUID W GRAM STAIN -BOTTLE

## 2016-12-05 ENCOUNTER — Emergency Department
Admission: EM | Admit: 2016-12-05 | Discharge: 2016-12-06 | Disposition: A | Discharge status: Skilled Nursing Facility | Payer: Medicare Other | Attending: Emergency Medicine | Admitting: Emergency Medicine

## 2016-12-05 ENCOUNTER — Encounter: Payer: Self-pay | Admitting: Emergency Medicine

## 2016-12-05 DIAGNOSIS — Z87891 Personal history of nicotine dependence: Secondary | ICD-10-CM | POA: Insufficient documentation

## 2016-12-05 DIAGNOSIS — E1165 Type 2 diabetes mellitus with hyperglycemia: Secondary | ICD-10-CM | POA: Diagnosis present

## 2016-12-05 DIAGNOSIS — I10 Essential (primary) hypertension: Secondary | ICD-10-CM | POA: Insufficient documentation

## 2016-12-05 DIAGNOSIS — Z79899 Other long term (current) drug therapy: Secondary | ICD-10-CM | POA: Insufficient documentation

## 2016-12-05 DIAGNOSIS — J45909 Unspecified asthma, uncomplicated: Secondary | ICD-10-CM | POA: Insufficient documentation

## 2016-12-05 DIAGNOSIS — E860 Dehydration: Secondary | ICD-10-CM | POA: Insufficient documentation

## 2016-12-05 DIAGNOSIS — Z8673 Personal history of transient ischemic attack (TIA), and cerebral infarction without residual deficits: Secondary | ICD-10-CM | POA: Insufficient documentation

## 2016-12-05 DIAGNOSIS — Z7951 Long term (current) use of inhaled steroids: Secondary | ICD-10-CM | POA: Insufficient documentation

## 2016-12-05 DIAGNOSIS — Z794 Long term (current) use of insulin: Secondary | ICD-10-CM | POA: Insufficient documentation

## 2016-12-05 DIAGNOSIS — R739 Hyperglycemia, unspecified: Secondary | ICD-10-CM

## 2016-12-05 DIAGNOSIS — E118 Type 2 diabetes mellitus with unspecified complications: Secondary | ICD-10-CM

## 2016-12-05 LAB — URINALYSIS, COMPLETE (UACMP) WITH MICROSCOPIC
BILIRUBIN URINE: NEGATIVE
Bacteria, UA: NONE SEEN
Hgb urine dipstick: NEGATIVE
KETONES UR: NEGATIVE mg/dL
LEUKOCYTES UA: NEGATIVE
NITRITE: NEGATIVE
PH: 7 (ref 5.0–8.0)
Protein, ur: NEGATIVE mg/dL
Specific Gravity, Urine: 1.013 (ref 1.005–1.030)

## 2016-12-05 LAB — CBC
HCT: 38.8 % (ref 35.0–47.0)
Hemoglobin: 13.1 g/dL (ref 12.0–16.0)
MCH: 31.8 pg (ref 26.0–34.0)
MCHC: 33.8 g/dL (ref 32.0–36.0)
MCV: 94 fL (ref 80.0–100.0)
PLATELETS: 120 10*3/uL — AB (ref 150–440)
RBC: 4.13 MIL/uL (ref 3.80–5.20)
RDW: 15.6 % — AB (ref 11.5–14.5)
WBC: 6.4 10*3/uL (ref 3.6–11.0)

## 2016-12-05 LAB — BASIC METABOLIC PANEL
Anion gap: 7 (ref 5–15)
BUN: 14 mg/dL (ref 6–20)
CALCIUM: 9.4 mg/dL (ref 8.9–10.3)
CO2: 28 mmol/L (ref 22–32)
Chloride: 100 mmol/L — ABNORMAL LOW (ref 101–111)
Creatinine, Ser: 0.81 mg/dL (ref 0.44–1.00)
GFR calc Af Amer: >60 mL/min (ref >60)
Glucose, Bld: 406 mg/dL — ABNORMAL HIGH (ref 65–99)
POTASSIUM: 4.3 mmol/L (ref 3.5–5.1)
SODIUM: 135 mmol/L (ref 135–145)

## 2016-12-05 LAB — GLUCOSE, CAPILLARY: Glucose-Capillary: 373 mg/dL — ABNORMAL HIGH (ref 65–99)

## 2016-12-05 NOTE — ED Notes (Signed)
Pt to ED triage per EMS from [facility]. Per EMS, pt had knee surgery 2 weeks ago, since has been having high blood sugar, per facility and pt she has been compliant with insulin and regular medication. CBG in route by EMS was 480. Pt has established IV and has received 224m of NS.

## 2016-12-06 ENCOUNTER — Emergency Department: Payer: Medicare Other

## 2016-12-06 DIAGNOSIS — E1165 Type 2 diabetes mellitus with hyperglycemia: Secondary | ICD-10-CM | POA: Diagnosis not present

## 2016-12-06 LAB — GLUCOSE, CAPILLARY
GLUCOSE-CAPILLARY: 295 mg/dL — AB (ref 65–99)
Glucose-Capillary: 164 mg/dL — ABNORMAL HIGH (ref 65–99)

## 2016-12-06 MED ORDER — SODIUM CHLORIDE 0.9 % IV BOLUS (SEPSIS)
1000.0000 mL | Freq: Once | INTRAVENOUS | Status: AC
  Administered 2016-12-06: 1000 mL via INTRAVENOUS

## 2016-12-06 MED ORDER — TRAMADOL HCL 50 MG PO TABS
100.0000 mg | ORAL_TABLET | Freq: Once | ORAL | Status: AC
  Administered 2016-12-06: 100 mg via ORAL
  Filled 2016-12-06: qty 2

## 2016-12-06 MED ORDER — INSULIN ASPART 100 UNIT/ML ~~LOC~~ SOLN
6.0000 [IU] | Freq: Once | SUBCUTANEOUS | Status: AC
  Administered 2016-12-06: 6 [IU] via SUBCUTANEOUS
  Filled 2016-12-06: qty 1

## 2016-12-06 NOTE — ED Notes (Signed)
Pt discharged to the [facility] after verbalizing understanding of discharge instructions; nad noted.

## 2016-12-06 NOTE — ED Provider Notes (Signed)
[facility] Emergency Department Provider Note   First MD Initiated Contact with Patient 12/06/16 0014     (approximate)  I have reviewed the triage vital signs and the nursing notes.   HISTORY  Chief Complaint Hyperglycemia    HPI Stephanie [name] is a 66 y.o. female with the below listed of chronic medical conditions including diabetes mellitus presents to the emergency department with hyperglycemia noted at the [facility] today. Patient's glucose 373 despite multiple doses of insulin while at the [employer] today. Patient states that she was given 9 units in the morning and then 9 units at lunchtime. Patient admits to thirst, polyuria polydipsia.   Past Medical History:  Diagnosis Date  . Adenomatous colon polyp 08/27/2014   Polyps x 3  . Anemia   . Anxiety   . 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 Arthritis   . Asthma   . Bipolar affective disorder ([place])    h/o  . Blood transfusion 2000  . Blood transfusion without reported diagnosis   . Chronic abdominal pain    abdominal wall pain  . 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 Colon polyp   . Depression   . Diabetes mellitus without complication ([place])   . Fatty liver 04/09/08   found in abd CT  . GERD (gastroesophageal reflux disease)   . H/O: CVA (cardiovascular accident)    TIA  . H/O: rheumatic fever   . Hepatitis    HEP "B" twenty years ago  . Hyperlipidemia   . Incontinence   . Insomnia   . Lower extremity edema   . Obstructive sleep apnea    Use C-PAP twice per week  . Platelets decreased ([place])   . RLS (restless legs syndrome)   . Shortness of breath   . Sleep apnea   . Stroke [facility])    TIA- 2002  . Ulcer   . Unspecified disorders of nervous system     Patient Active Problem List   Diagnosis Date Noted  . Encephalopathy 07/19/2016  . Lymphedema 06/14/2016  . Pain in limb 05/06/2016  . Swelling of limb 05/06/2016  . Postphlebitic syndrome with inflammation 05/06/2016  . Pneumonia  04/18/2016  . Chest pain 04/04/2016  . Iron deficiency anemia 01/30/2016  . Hypoglycemia 09/09/2015  . Hyperglycemia 07/16/2015  . Neuropathy 06/08/2015  . Thrombocytopenia ([place]) 08/19/2014  . Abdominal pain, chronic, epigastric 06/12/2014  . Liver cirrhosis secondary to NASH ([place]) 02/27/2014  . Dizziness and giddiness 10/30/2013  . DOE (dyspnea on exertion) 10/30/2013  . Poorly controlled type 2 diabetes mellitus with complication ([place]) 67/05/4579  . DNR (do not resuscitate) 07/29/2013  . Encounter for routine gynecological examination 07/29/2013  . Dyspnea 07/10/2013  . Lung nodule 07/10/2013  . Varicose veins of left lower extremity 10/09/2012  . GERD (gastroesophageal reflux disease) 10/09/2012  . Cirrhosis of liver ([place]) 02/02/2012  . Anxiety 07/11/2011  . Aortic dissection ([place])   . BACK PAIN, LUMBAR, CHRONIC 11/19/2009  . UNS ADVRS EFF OTH RX MEDICINAL&BIOLOGICAL SBSTNC 12/16/2008  . UNSPECIFIED VITAMIN D DEFICIENCY 09/26/2008  . TOBACCO ABUSE 06/19/2008  . RESTLESS LEGS SYNDROME 04/27/2007  . INSOMNIA 04/27/2007  . HLD (hyperlipidemia) 03/08/2007  . OSA (obstructive sleep apnea) 03/08/2007  . Essential hypertension 03/08/2007  . Asthma 03/08/2007  . BIPOLAR AFFECTIVE DISORDER, HX OF 03/08/2007    Past Surgical History:  Procedure Laterality Date  . ABDOMINAL EXPLORATION SURGERY    . ABDOMINAL HYSTERECTOMY  total  . Axillary artery cannulation via 8-mm Hemashield graft, median sternotomy, extracorporeal circulation with deep hypothermic circulatory arrest, repair of  aortic dessection  01/23/2009   Dr Arlyce Dice  . BACK SURGERY      x 5  . cataract     both eyes  . CHOLECYSTECTOMY    . COLONOSCOPY    . EYE SURGERY Bilateral    Cataract Extraction with IOL  . HEMORROIDECTOMY     and colon polyp removed  . KNEE ARTHROSCOPY WITH LATERAL RELEASE Right 11/15/2016   Procedure: KNEE ARTHROSCOPY WITH LATERAL RELEASE;  Surgeon: Corky Mull, MD;  Location: [facility] ORS;   Service: Orthopedics;  Laterality: Right;  . LIVER BIOPSY  04/10/2012   Procedure: LIVER BIOPSY;  Surgeon: Inda Castle, MD;  Location: [facility] END[facility]COPY;  Service: Endoscopy;  Laterality: N/A;  ultrasound to mark order for abd limited/liver to be marked to be sent by [email or web address]   . LUMBAR FUSION  10/09  . LUMBAR WOUND DEBRIDEMENT  05/09/2011   Procedure: LUMBAR WOUND DEBRIDEMENT;  Surgeon: Dahlia Bailiff;  Location: [place];  Service: Orthopedics;  Laterality: N/A;  IRRIGATION AND DEBRIDEMENT SPINAL WOUND  . POSTERIOR CERVICAL FUSION/FORAMINOTOMY    . ROTATOR CUFF REPAIR     left    Prior to Admission medications   Medication Sig Start Date End Date Taking? Authorizing Provider  acetaminophen (TYLENOL) 325 MG tablet Take 650 mg by mouth every 4 (four) hours as needed (for fever, minor discomfort, or headache).     [provider]  albuterol (PROVENTIL HFA;VENTOLIN HFA) 108 (90 Base) MCG/ACT inhaler Inhale 2 puffs into the lungs every 6 (six) hours as needed for wheezing or shortness of breath. 07/28/16   Alfred Levins, [place], MD  albuterol (PROVENTIL) (2.5 MG/3ML) 0.083% nebulizer solution Take 2.5 mg by nebulization every 6 (six) hours as needed for wheezing or shortness of breath.    [provider]  alum & mag hydroxide-simeth (ANTACID LIQUID) 200-200-20 MG/5ML suspension Take 30 mLs by mouth every 6 (six) hours as needed for indigestion or heartburn. *NOTIFY MD IF SYMPTOMS PERSISTS FOR MORE THAN 3 DAYS.*    [provider]  ARIPiprazole (ABILIFY) 30 MG tablet Take 30 mg by mouth daily.    [provider]  barrier cream (NON-SPECIFIED) CREA Apply 1 application topically as needed (APPLIED AFTER TOILETING TO PREVENT SKIN BREAKDOWN).    [provider]  budesonide-formoterol (SYMBICORT) 160-4.5 MCG/ACT inhaler Inhale 2 puffs into the lungs 2 (two) times daily. (0800 & 2000)    [provider]  buPROPion (WELLBUTRIN SR) 150 MG 12 hr tablet Take 150  mg by mouth daily. (0800)    [provider]  cephALEXin (KEFLEX) 500 MG capsule Take 1 capsule (500 mg total) by mouth 2 (two) times daily. 11/23/16   Carrie Mew, MD  clonazePAM (KLONOPIN) 0.5 MG tablet Take 0.25 mg by mouth 3 (three) times daily. (0800, 1300 & 2000)    [provider]  dicyclomine (BENTYL) 10 MG capsule Take 10 mg by mouth 3 (three) times daily. (0800, 1300 & 2000)    [provider]  diphenhydrAMINE (DIPHENHIST) 25 mg capsule Take 25 mg by mouth every 6 (six) hours as needed (for allergic reaction (notify provider is symptoms persist more than 24 hrs*).    [provider]  Emollient (EUCERIN INTENSIVE REPAIR) LOTN Apply 1 application topically 2 (two) times daily.    [provider]  ferrous sulfate 325 (65 FE) MG tablet Take 325  mg by mouth 2 (two) times daily. (0800 & 2000)    [provider]  furosemide (LASIX) 80 MG tablet Take 80 mg by mouth daily. (0800)    [provider]  gabapentin (NEURONTIN) 600 MG tablet Take 600 mg by mouth 2 (two) times daily. (0800 & 2000)    [provider]  guaifenesin (ROBITUSSIN) 100 MG/5ML syrup Take 300 mg by mouth every 6 (six) hours as needed for cough.     [provider]  insulin lispro (HUMALOG KWIKPEN) 100 UNIT/ML KiwkPen Inject 0-9 Units into the skin 3 (three) times daily. Check FSBS before each meal & cover with SSI (sliding scale insulin)    [provider]  lactulose (CHRONULAC) 10 GM/15ML solution Take 10 g by mouth 3 (three) times a week. 10/17/16   [provider]  lamoTRIgine (LAMICTAL) 100 MG tablet Take 100 mg by mouth 2 (two) times daily. (0800 & 2000)    [provider]  loperamide (IMODIUM) 2 MG capsule Take 4 mg by mouth as needed for diarrhea or loose stools. *Do not exceed 8 doses in 24 hours*    [provider]  LORazepam (ATIVAN) 0.5 MG tablet Take 1 tablet (0.5 mg total) by mouth daily as needed for  anxiety. Patient not taking: Reported on 11/23/2016 07/21/16   Dustin Flock, MD  magnesium hydroxide (MILK OF MAGNESIA) 400 MG/5ML suspension Take 30 mLs by mouth daily as needed (for constipation).    [provider]  Menthol-Zinc Oxide (GOLD BOND EX) Apply 1 application topically daily.    [provider]  metoCLOPramide (REGLAN) 5 MG tablet Take 5 mg by mouth 4 (four) times daily -  before meals and at bedtime. (0800, 1200, 1600 & 2000)    [provider]  mirtazapine (REMERON) 15 MG tablet Take 30 mg by mouth at bedtime. (2000)     [provider]  omeprazole (PRILOSEC) 20 MG capsule Take 20 mg by mouth daily before breakfast. (0630)    [provider]  oxybutynin (DITROPAN-XL) 5 MG 24 hr tablet Take 5 mg by mouth daily. (0800)    [provider]  oxyCODONE (ROXICODONE) 5 MG immediate release tablet Take 1 tablet (5 mg total) by mouth every 4 (four) hours as needed for severe pain. 11/15/16   Poggi, Marshall Cork, MD  potassium chloride SA (K-DUR,KLOR-CON) 20 MEQ tablet Take 20 mEq by mouth daily. (0800)    [provider]  pyrithione zinc (HEAD AND SHOULDERS) 1 % shampoo Apply 1 application topically daily. t-gel or head and shoulders.    [provider]  rOPINIRole (REQUIP) 3 MG tablet Take 3 mg by mouth at bedtime. (2000)    [provider]  simvastatin (ZOCOR) 10 MG tablet Take 10 mg by mouth at bedtime. (2000)    [provider]  spironolactone (ALDACTONE) 25 MG tablet Take 25 mg by mouth daily. (0800)    [provider]  traMADol (ULTRAM) 50 MG tablet Take 50 mg by mouth 2 (two) times daily. (0800 & 2000)    [provider]  vitamin C (ASCORBIC ACID) 500 MG tablet Take 500 mg by mouth 2 (two) times daily. (0800 & 2000)    [provider]  Water For Irrigation, Sterile ([place]) Irrigate with as directed at bedtime. Use with CPAP machine at bedtime during sleep &  remove in the morning    [provider]  zolpidem (AMBIEN) 5 MG tablet Take 5 mg  by mouth at bedtime. (2000)    [provider]    Allergies Doxycycline  Family History  Problem Relation Age of Onset  . Breast cancer Mother   . Lung cancer Mother   . Stomach cancer Father   . Diabetes Unknown        4 aunts, and 1 uncle  . Colon cancer Neg Hx   . Esophageal cancer Neg Hx   . Rectal cancer Neg Hx     Social History Social History  Substance Use Topics  . Smoking status: Former Smoker    Packs/day: 1.50    Years: 50.00    Types: Cigarettes    Quit date: 04/06/2013  . Smokeless tobacco: Never Used  . Alcohol use No    Review of Systems Constitutional: No fever/chills Eyes: No visual changes. ENT: No sore throat. Cardiovascular: Denies chest pain. Respiratory: Denies shortness of breath. Gastrointestinal: No abdominal pain.  No nausea, no vomiting.  No diarrhea.  No constipation. Genitourinary: Negative for dysuria. Musculoskeletal: Negative for neck pain.  Negative for back pain. Integumentary: Negative for rash. Neurological: Negative for headaches, focal weakness or numbness.   ____________________________________________   PHYSICAL EXAM:  VITAL SIGNS: ED Triage Vitals  Enc Vitals Group     BP 12/05/16 2232 (!) 144/59     Pulse Rate 12/05/16 2232 92     Resp 12/05/16 2232 20     Temp 12/05/16 2232 98.6 F (37 C)     Temp Source 12/05/16 2232 Oral     SpO2 12/05/16 2232 94 %     Weight 12/05/16 2230 102.1 kg (225 lb)     Height 12/05/16 2230 1.575 m (5' 2" )     Head Circumference --      Peak Flow --      Pain Score --      Pain Loc --      Pain Edu? --      Excl. in [place]? --     Constitutional: Alert and oriented. Well appearing and in no acute distress. Eyes: Conjunctivae are normal. PERRL. EOMI. Head: Atraumatic. Mouth/Throat: Mucous membranes are Dry Neck: No stridor.   Cardiovascular: Normal rate, regular rhythm. Good  peripheral circulation. Grossly normal heart sounds. Respiratory: Normal respiratory effort.  No retractions. Lungs CTAB. Gastrointestinal: Soft and nontender. No distention.  Musculoskeletal: No lower extremity tenderness nor edema. No gross deformities of extremities. Neurologic:  Normal speech and language. No gross focal neurologic deficits are appreciated.  Skin:  Skin is warm, dry and intact. No rash noted. Psychiatric: Mood and affect are normal. Speech and behavior are normal.  ____________________________________________   LABS (all labs ordered are listed, but only abnormal results are displayed)  Labs Reviewed  BASIC METABOLIC PANEL - Abnormal; Notable for the following:       Result Value   Chloride 100 (*)    Glucose, Bld 406 (*)    All other components within normal limits  CBC - Abnormal; Notable for the following:    RDW 15.6 (*)    Platelets 120 (*)    All other components within normal limits  URINALYSIS, COMPLETE (UACMP) WITH MICROSCOPIC - Abnormal; Notable for the following:    Color, Urine STRAW (*)    APPearance CLEAR (*)    Glucose, UA >=500 (*)    Squamous Epithelial / LPF 0-5 (*)    All other components within normal limits  GLUCOSE, CAPILLARY - Abnormal; Notable for the following:    Glucose-Capillary 373 (*)  All other components within normal limits  GLUCOSE, CAPILLARY - Abnormal; Notable for the following:    Glucose-Capillary 295 (*)    All other components within normal limits  GLUCOSE, CAPILLARY - Abnormal; Notable for the following:    Glucose-Capillary 164 (*)    All other components within normal limits  CBG MONITORING, ED   _  RADIOLOGY I, [place], personally viewed and evaluated these images (plain radiographs) as part of my medical decision making, as well as reviewing the written report by the radiologist.  Dg Chest 2 View  Result Date: 12/06/2016 CLINICAL DATA:  Dyspnea. EXAM: CHEST  2 VIEW COMPARISON:  Most recent  radiographs 09/28/2016, chest CT 06/28/2016 FINDINGS: The cardiomediastinal contours are unchanged with stable cardiomegaly. Pulmonary vasculature is normal. No consolidation, pleural effusion, or pneumothorax. No acute osseous abnormalities are seen. Spinal stimulator remains in place. Surgical hardware in the lower cervical spine is partially included. IMPRESSION: No acute abnormality.  Stable mild cardiomegaly. Electronically Signed   By: Jeb Levering M.D.   On: 12/06/2016 00:57      Procedures   ____________________________________________   INITIAL IMPRESSION / ASSESSMENT AND PLAN / ED COURSE  Pertinent labs & imaging results that were available during my care of the patient were reviewed by me and considered in my medical decision making (see chart for details).  Patient given 2 L IV normal saline repeat glucose 294 and a such patient was given 6 units subcutaneous insulin with resulting glucose 164. Patient clearly markedly dehydrated on arrival to the emergency department.      ____________________________________________  FINAL CLINICAL IMPRESSION(S) / ED DIAGNOSES  Final diagnoses:  Hyperglycemia  Type 2 diabetes mellitus with complication, without long-term current use of insulin (HCC)     MEDICATIONS GIVEN DURING THIS VISIT:  Medications  traMADol (ULTRAM) tablet 100 mg (100 mg Oral Given 12/06/16 0027)  sodium chloride 0.9 % bolus 1,000 mL (0 mLs Intravenous Stopped 12/06/16 0142)  sodium chloride 0.9 % bolus 1,000 mL (0 mLs Intravenous Stopped 12/06/16 0257)  insulin aspart (novoLOG) injection 6 Units (6 Units Subcutaneous Given 12/06/16 0357)     NEW OUTPATIENT MEDICATIONS STARTED DURING THIS VISIT:  New Prescriptions   No medications on file    Modified Medications   No medications on file    Discontinued Medications   No medications on file     Note:  This document was prepared using Dragon voice recognition software and may include unintentional  dictation errors.    Gregor Hams, MD 12/06/16 [id number]

## 2016-12-07 IMAGING — CR DG CHEST 2V
1 series · 2 of 2 positions shown · non-contrast
Comparison: Chest radiographs and chest CT 04/04/2016 and earlier.

CLINICAL DATA: 65-year-old female with nonproductive cough for 4
days. Chest pain. Fever and hypoxia. Initial encounter.

EXAM:
CHEST  2 VIEW

[Series 1: dg chest 2 view · 0.14mm/px · 2 of 2 slices shown]
[im 1/2]
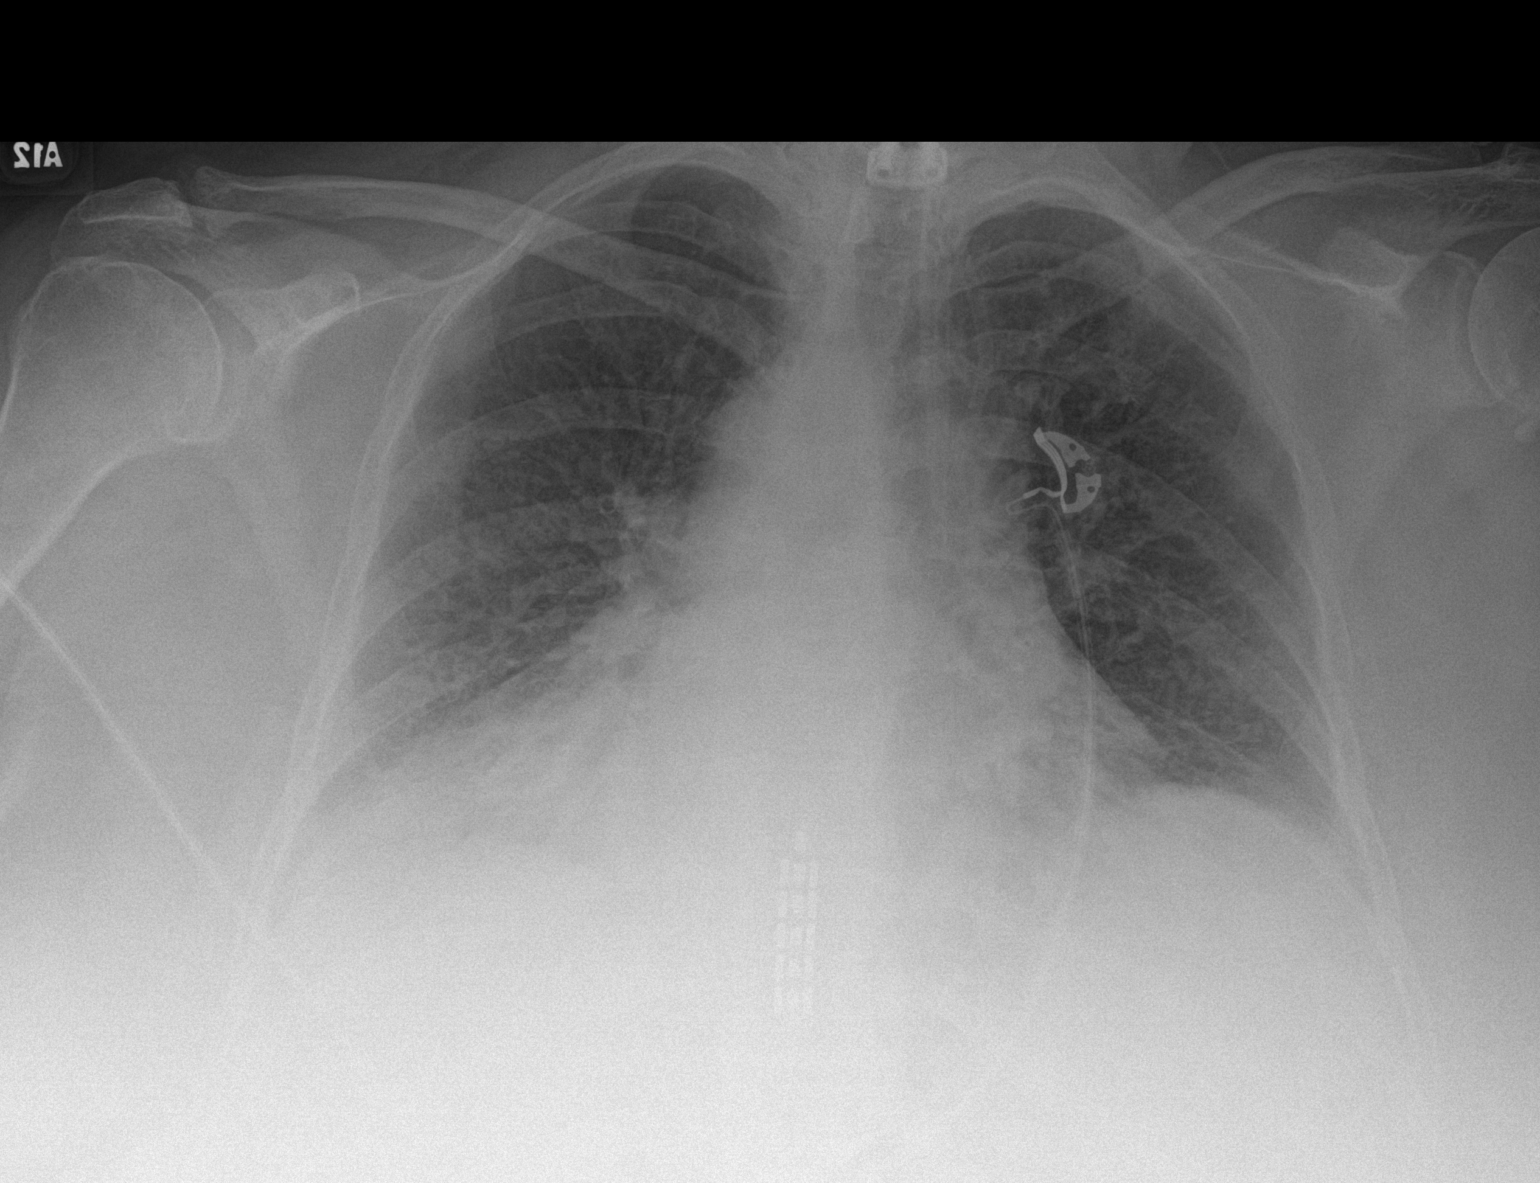
[im 2/2]
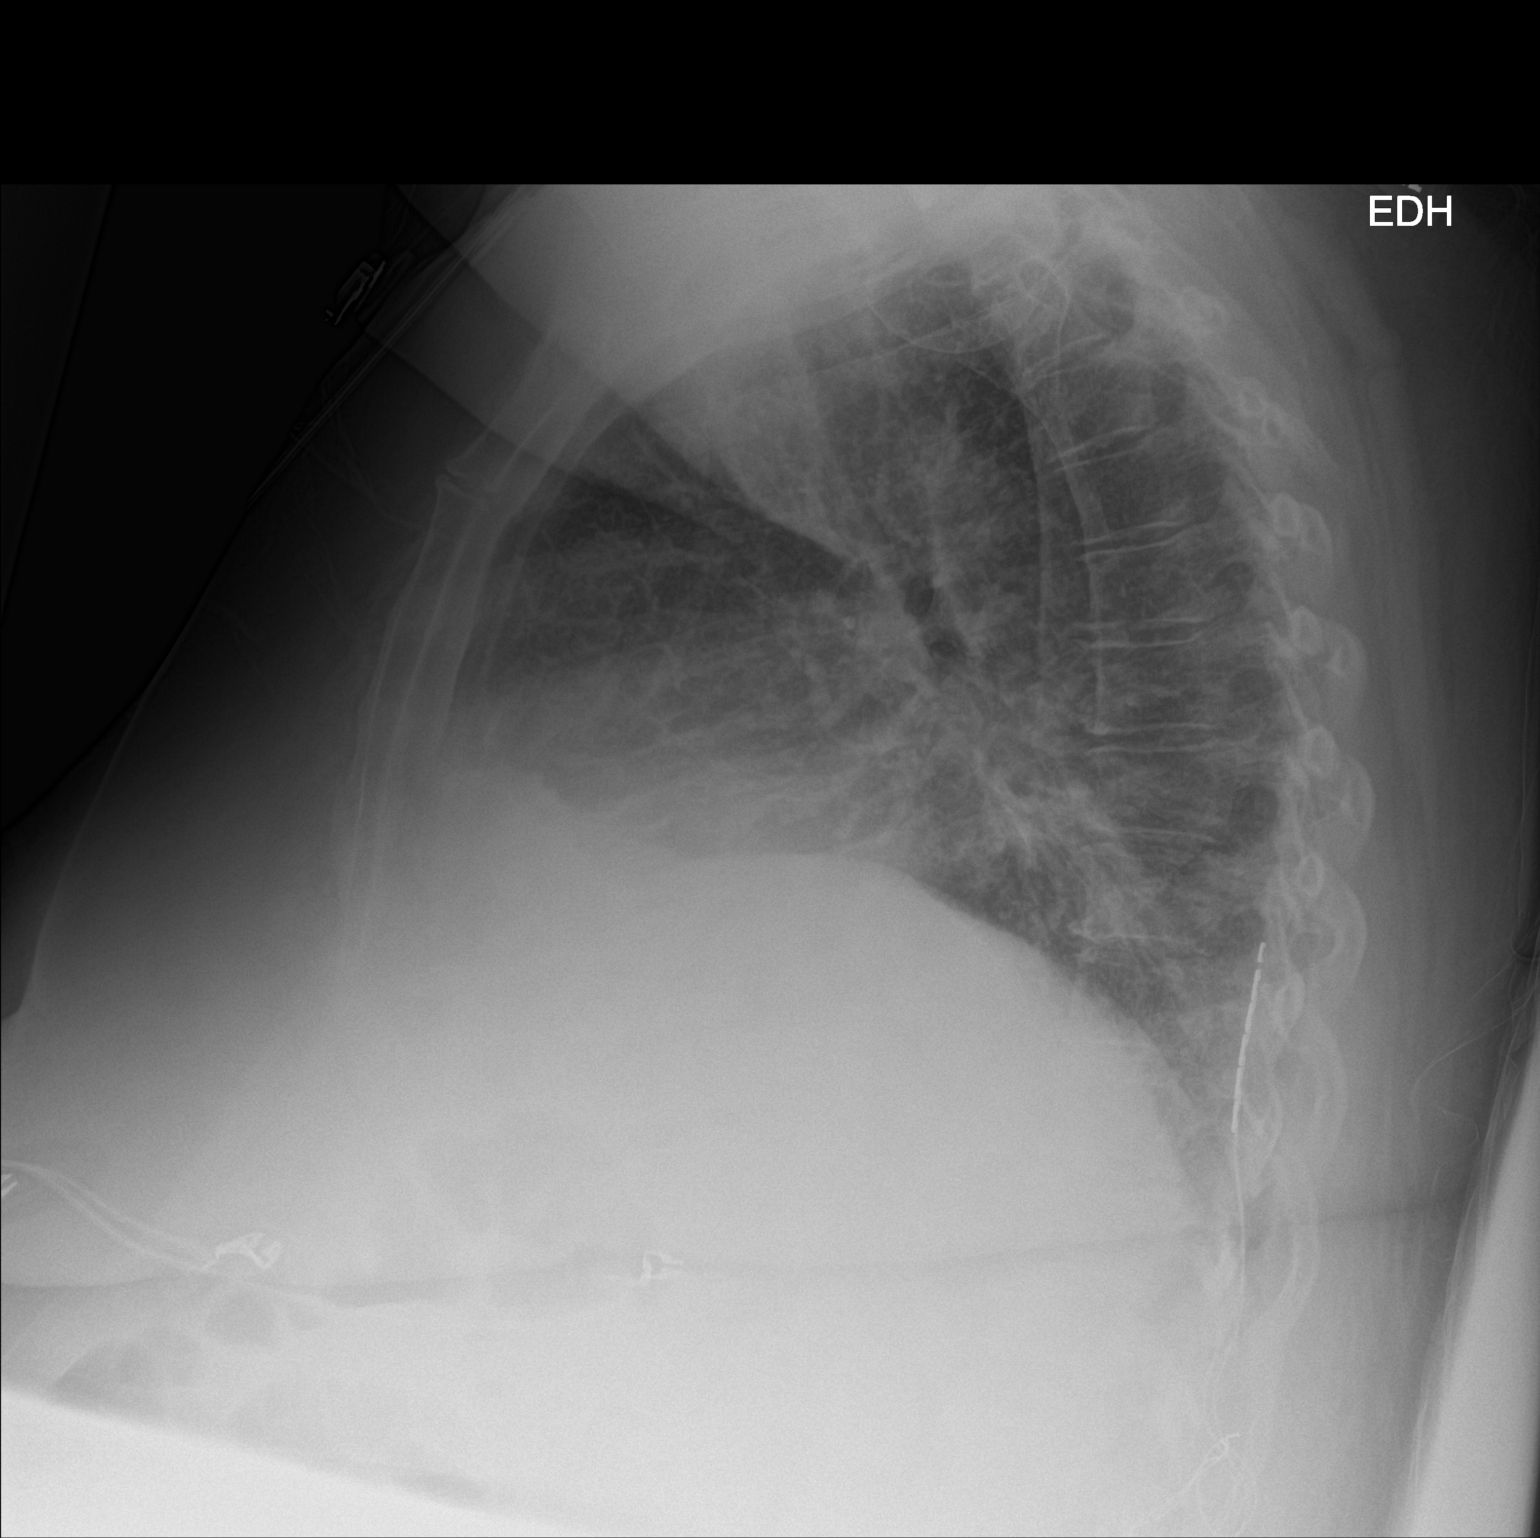

[2 of 2 positions shown; findings below may reference images not displayed]

FINDINGS: Large body habitus. Cervical ACDF hardware. Lower thoracic spinal
stimulator device again noted. Stable lung volumes. On the frontal
view there is decreased visualization of the right hemidiaphragm,
but this is not correlated on the lateral view. No pneumothorax.
Stable pulmonary vascularity without overt edema. No pleural
effusion.

Stable cardiac size and mediastinal contours. No acute osseous
abnormality identified.
IMPRESSION: 1. Decreased ventilation at the anterior right lung base since
04/04/2016. This might represent atelectasis but is suspicious for
bronchopneumonia in this clinical setting. No pleural effusion.
2. Otherwise stable chest.

## 2016-12-09 ENCOUNTER — Emergency Department: Payer: Medicare Other

## 2016-12-09 ENCOUNTER — Emergency Department
Admission: EM | Admit: 2016-12-09 | Discharge: 2016-12-09 | Disposition: A | Discharge status: Home / Self Care | Payer: Medicare Other | Attending: Emergency Medicine | Admitting: Emergency Medicine

## 2016-12-09 ENCOUNTER — Encounter: Payer: Self-pay | Admitting: *Deleted

## 2016-12-09 DIAGNOSIS — Z8673 Personal history of transient ischemic attack (TIA), and cerebral infarction without residual deficits: Secondary | ICD-10-CM | POA: Diagnosis not present

## 2016-12-09 DIAGNOSIS — M25561 Pain in right knee: Secondary | ICD-10-CM | POA: Diagnosis not present

## 2016-12-09 DIAGNOSIS — Z87891 Personal history of nicotine dependence: Secondary | ICD-10-CM | POA: Diagnosis not present

## 2016-12-09 DIAGNOSIS — J45909 Unspecified asthma, uncomplicated: Secondary | ICD-10-CM | POA: Insufficient documentation

## 2016-12-09 DIAGNOSIS — N189 Chronic kidney disease, unspecified: Secondary | ICD-10-CM

## 2016-12-09 DIAGNOSIS — E1165 Type 2 diabetes mellitus with hyperglycemia: Secondary | ICD-10-CM | POA: Diagnosis present

## 2016-12-09 DIAGNOSIS — E860 Dehydration: Secondary | ICD-10-CM | POA: Insufficient documentation

## 2016-12-09 DIAGNOSIS — Z79899 Other long term (current) drug therapy: Secondary | ICD-10-CM | POA: Insufficient documentation

## 2016-12-09 DIAGNOSIS — Z794 Long term (current) use of insulin: Secondary | ICD-10-CM | POA: Diagnosis not present

## 2016-12-09 DIAGNOSIS — R739 Hyperglycemia, unspecified: Secondary | ICD-10-CM

## 2016-12-09 LAB — BASIC METABOLIC PANEL
Anion gap: 9 (ref 5–15)
BUN: 14 mg/dL (ref 6–20)
CHLORIDE: 96 mmol/L — AB (ref 101–111)
CO2: 29 mmol/L (ref 22–32)
CREATININE: 0.7 mg/dL (ref 0.44–1.00)
Calcium: 8.8 mg/dL — ABNORMAL LOW (ref 8.9–10.3)
GFR calc Af Amer: >60 mL/min (ref >60)
GFR calc non Af Amer: >60 mL/min (ref >60)
Glucose, Bld: 400 mg/dL — ABNORMAL HIGH (ref 65–99)
POTASSIUM: 4 mmol/L (ref 3.5–5.1)
Sodium: 134 mmol/L — ABNORMAL LOW (ref 135–145)

## 2016-12-09 LAB — CBC
HEMATOCRIT: 37.2 % (ref 35.0–47.0)
Hemoglobin: 12.7 g/dL (ref 12.0–16.0)
MCH: 32 pg (ref 26.0–34.0)
MCHC: 34.2 g/dL (ref 32.0–36.0)
MCV: 93.5 fL (ref 80.0–100.0)
PLATELETS: 102 10*3/uL — AB (ref 150–440)
RBC: 3.98 MIL/uL (ref 3.80–5.20)
RDW: 15.5 % — AB (ref 11.5–14.5)
WBC: 6.8 10*3/uL (ref 3.6–11.0)

## 2016-12-09 LAB — URINALYSIS, COMPLETE (UACMP) WITH MICROSCOPIC
BILIRUBIN URINE: NEGATIVE
Glucose, UA: >=500 mg/dL — AB
Hgb urine dipstick: NEGATIVE
KETONES UR: NEGATIVE mg/dL
LEUKOCYTES UA: NEGATIVE
Nitrite: NEGATIVE
Protein, ur: NEGATIVE mg/dL
RBC / HPF: NONE SEEN RBC/hpf (ref 0–5)
Specific Gravity, Urine: 1.002 — ABNORMAL LOW (ref 1.005–1.030)
WBC, UA: NONE SEEN WBC/hpf (ref 0–5)
pH: 6 (ref 5.0–8.0)

## 2016-12-09 LAB — GLUCOSE, CAPILLARY
GLUCOSE-CAPILLARY: 308 mg/dL — AB (ref 65–99)
GLUCOSE-CAPILLARY: 292 mg/dL — AB (ref 65–99)

## 2016-12-09 MED ORDER — SODIUM CHLORIDE 0.9 % IV BOLUS (SEPSIS)
1000.0000 mL | Freq: Once | INTRAVENOUS | Status: AC
  Administered 2016-12-09: 1000 mL via INTRAVENOUS

## 2016-12-09 MED ORDER — TRAMADOL HCL 50 MG PO TABS
100.0000 mg | ORAL_TABLET | Freq: Once | ORAL | Status: AC
  Administered 2016-12-09: 100 mg via ORAL
  Filled 2016-12-09: qty 2

## 2016-12-09 NOTE — ED Provider Notes (Signed)
[facility] Emergency Department Provider Note  ____________________________________________  Time seen: Approximately 1:30 PM  I have reviewed the triage vital signs and the nursing notes.   HISTORY  Chief Complaint Hyperglycemia    HPI Stephanie [name] is a 66 y.o. female sent to the ED due to high blood sugar. Her monitor had read high at home. She is to take at her insulin as prescribed. Denies any acute complaints. Has chronic right arm pain, chronic right knee pain. Denies dysuria frequency urgency. Reports she's been eating and drinking normally. No chest pain or belly pain back pain shortness of breath. Overall feels like she is in her usual state of health except for the high blood sugar. A Stiffness.   Past Medical History:  Diagnosis Date  . Adenomatous colon polyp 08/27/2014   Polyps x 3  . Anemia   . Anxiety   . 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 Arthritis   . Asthma   . Bipolar affective disorder ([place])    h/o  . Blood transfusion 2000  . Blood transfusion without reported diagnosis   . Chronic abdominal pain    abdominal wall pain  . 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 Colon polyp   . Depression   . Diabetes mellitus without complication ([place])   . Fatty liver 04/09/08   found in abd CT  . GERD (gastroesophageal reflux disease)   . H/O: CVA (cardiovascular accident)    TIA  . H/O: rheumatic fever   . Hepatitis    HEP "B" twenty years ago  . Hyperlipidemia   . Incontinence   . Insomnia   . Lower extremity edema   . Obstructive sleep apnea    Use C-PAP twice per week  . Platelets decreased ([place])   . RLS (restless legs syndrome)   . Shortness of breath   . Sleep apnea   . Stroke [facility])    TIA- 2002  . Ulcer   . Unspecified disorders of nervous system      Patient Active Problem List   Diagnosis Date Noted  . Encephalopathy 07/19/2016  . Lymphedema 06/14/2016  . Pain in limb 05/06/2016  . Swelling of limb  05/06/2016  . Postphlebitic syndrome with inflammation 05/06/2016  . Pneumonia 04/18/2016  . Chest pain 04/04/2016  . Iron deficiency anemia 01/30/2016  . Hypoglycemia 09/09/2015  . Hyperglycemia 07/16/2015  . Neuropathy 06/08/2015  . Thrombocytopenia ([place]) 08/19/2014  . Abdominal pain, chronic, epigastric 06/12/2014  . Liver cirrhosis secondary to NASH ([place]) 02/27/2014  . Dizziness and giddiness 10/30/2013  . DOE (dyspnea on exertion) 10/30/2013  . Poorly controlled type 2 diabetes mellitus with complication ([place]) 40/98/1191  . DNR (do not resuscitate) 07/29/2013  . Encounter for routine gynecological examination 07/29/2013  . Dyspnea 07/10/2013  . Lung nodule 07/10/2013  . Varicose veins of left lower extremity 10/09/2012  . GERD (gastroesophageal reflux disease) 10/09/2012  . Cirrhosis of liver ([place]) 02/02/2012  . Anxiety 07/11/2011  . Aortic dissection ([place])   . BACK PAIN, LUMBAR, CHRONIC 11/19/2009  . UNS ADVRS EFF OTH RX MEDICINAL&BIOLOGICAL SBSTNC 12/16/2008  . UNSPECIFIED VITAMIN D DEFICIENCY 09/26/2008  . TOBACCO ABUSE 06/19/2008  . RESTLESS LEGS SYNDROME 04/27/2007  . INSOMNIA 04/27/2007  . HLD (hyperlipidemia) 03/08/2007  . OSA (obstructive sleep apnea) 03/08/2007  . Essential hypertension 03/08/2007  . Asthma 03/08/2007  . BIPOLAR AFFECTIVE DISORDER, HX OF 03/08/2007     Past Surgical History:  Procedure Laterality Date  . ABDOMINAL EXPLORATION SURGERY    . ABDOMINAL HYSTERECTOMY     total  . Axillary artery cannulation via 8-mm Hemashield graft, median sternotomy, extracorporeal circulation with deep hypothermic circulatory arrest, repair of  aortic dessection  01/23/2009   Dr Arlyce Dice  . BACK SURGERY      x 5  . cataract     both eyes  . CHOLECYSTECTOMY    . COLONOSCOPY    . EYE SURGERY Bilateral    Cataract Extraction with IOL  . HEMORROIDECTOMY     and colon polyp removed  . KNEE ARTHROSCOPY WITH LATERAL RELEASE Right 11/15/2016   Procedure: KNEE  ARTHROSCOPY WITH LATERAL RELEASE;  Surgeon: Corky Mull, MD;  Location: [facility] ORS;  Service: Orthopedics;  Laterality: Right;  . LIVER BIOPSY  04/10/2012   Procedure: LIVER BIOPSY;  Surgeon: Inda Castle, MD;  Location: [facility] END[facility];  Service: Endoscopy;  Laterality: N/A;  ultrasound to mark order for abd limited/liver to be marked to be sent by [email or web address]   . LUMBAR FUSION  10/09  . LUMBAR WOUND DEBRIDEMENT  05/09/2011   Procedure: LUMBAR WOUND DEBRIDEMENT;  Surgeon: Dahlia Bailiff;  Location: [place];  Service: Orthopedics;  Laterality: N/A;  IRRIGATION AND DEBRIDEMENT SPINAL WOUND  . POSTERIOR CERVICAL FUSION/FORAMINOTOMY    . ROTATOR CUFF REPAIR     left     Prior to Admission medications   Medication Sig Start Date End Date Taking? Authorizing Provider  acetaminophen (TYLENOL) 325 MG tablet Take 650 mg by mouth every 4 (four) hours as needed (for fever, minor discomfort, or headache).     [provider]  albuterol (PROVENTIL HFA;VENTOLIN HFA) 108 (90 Base) MCG/ACT inhaler Inhale 2 puffs into the lungs every 6 (six) hours as needed for wheezing or shortness of breath. 07/28/16   Alfred Levins, [place], MD  albuterol (PROVENTIL) (2.5 MG/3ML) 0.083% nebulizer solution Take 2.5 mg by nebulization every 6 (six) hours as needed for wheezing or shortness of breath.    [provider]  alum & mag hydroxide-simeth (ANTACID LIQUID) 200-200-20 MG/5ML suspension Take 30 mLs by mouth every 6 (six) hours as needed for indigestion or heartburn. *NOTIFY MD IF SYMPTOMS PERSISTS FOR MORE THAN 3 DAYS.*    [provider]  ARIPiprazole (ABILIFY) 30 MG tablet Take 30 mg by mouth daily.    [provider]  barrier cream (NON-SPECIFIED) CREA Apply 1 application topically as needed (APPLIED AFTER TOILETING TO PREVENT SKIN BREAKDOWN).    [provider]  budesonide-formoterol (SYMBICORT) 160-4.5 MCG/ACT inhaler Inhale 2 puffs into the lungs 2 (two) times daily. (0800 &  2000)    [provider]  buPROPion (WELLBUTRIN SR) 150 MG 12 hr tablet Take 150 mg by mouth daily. (0800)    [provider]  cephALEXin (KEFLEX) 500 MG capsule Take 1 capsule (500 mg total) by mouth 2 (two) times daily. 11/23/16   Carrie Mew, MD  clonazePAM (KLONOPIN) 0.5 MG tablet Take 0.25 mg by mouth 3 (three) times daily. (0800, 1300 & 2000)    [provider]  dicyclomine (BENTYL) 10 MG capsule Take 10 mg by mouth 3 (three) times daily. (0800, 1300 & 2000)    [provider]  diphenhydrAMINE (DIPHENHIST) 25 mg capsule Take 25 mg by mouth every 6 (six) hours as needed (for allergic reaction (notify provider is symptoms persist more than 24 hrs*).    [provider]  Emollient (EUCERIN INTENSIVE REPAIR) LOTN Apply 1 application topically 2 (  two) times daily.    [provider]  ferrous sulfate 325 (65 FE) MG tablet Take 325 mg by mouth 2 (two) times daily. (0800 & 2000)    [provider]  furosemide (LASIX) 80 MG tablet Take 80 mg by mouth daily. (0800)    [provider]  gabapentin (NEURONTIN) 600 MG tablet Take 600 mg by mouth 2 (two) times daily. (0800 & 2000)    [provider]  guaifenesin (ROBITUSSIN) 100 MG/5ML syrup Take 300 mg by mouth every 6 (six) hours as needed for cough.     [provider]  insulin lispro (HUMALOG KWIKPEN) 100 UNIT/ML KiwkPen Inject 0-9 Units into the skin 3 (three) times daily. Check FSBS before each meal & cover with SSI (sliding scale insulin)    [provider]  lactulose (CHRONULAC) 10 GM/15ML solution Take 10 g by mouth 3 (three) times a week. 10/17/16   [provider]  lamoTRIgine (LAMICTAL) 100 MG tablet Take 100 mg by mouth 2 (two) times daily. (0800 & 2000)    [provider]  loperamide (IMODIUM) 2 MG capsule Take 4 mg by mouth as needed for diarrhea or loose stools. *Do not exceed 8 doses in 24 hours*    [provider]   LORazepam (ATIVAN) 0.5 MG tablet Take 1 tablet (0.5 mg total) by mouth daily as needed for anxiety. Patient not taking: Reported on 11/23/2016 07/21/16   Dustin Flock, MD  magnesium hydroxide (MILK OF MAGNESIA) 400 MG/5ML suspension Take 30 mLs by mouth daily as needed (for constipation).    [provider]  Menthol-Zinc Oxide (GOLD BOND EX) Apply 1 application topically daily.    [provider]  metoCLOPramide (REGLAN) 5 MG tablet Take 5 mg by mouth 4 (four) times daily -  before meals and at bedtime. (0800, 1200, 1600 & 2000)    [provider]  mirtazapine (REMERON) 15 MG tablet Take 30 mg by mouth at bedtime. (2000)     [provider]  omeprazole (PRILOSEC) 20 MG capsule Take 20 mg by mouth daily before breakfast. (0630)    [provider]  oxybutynin (DITROPAN-XL) 5 MG 24 hr tablet Take 5 mg by mouth daily. (0800)    [provider]  oxyCODONE (ROXICODONE) 5 MG immediate release tablet Take 1 tablet (5 mg total) by mouth every 4 (four) hours as needed for severe pain. 11/15/16   Poggi, Marshall Cork, MD  potassium chloride SA (K-DUR,KLOR-CON) 20 MEQ tablet Take 20 mEq by mouth daily. (0800)    [provider]  pyrithione zinc (HEAD AND SHOULDERS) 1 % shampoo Apply 1 application topically daily. t-gel or head and shoulders.    [provider]  rOPINIRole (REQUIP) 3 MG tablet Take 3 mg by mouth at bedtime. (2000)    [provider]  simvastatin (ZOCOR) 10 MG tablet Take 10 mg by mouth at bedtime. (2000)    [provider]  spironolactone (ALDACTONE) 25 MG tablet Take 25 mg by mouth daily. (0800)    [provider]  traMADol (ULTRAM) 50 MG tablet Take 50 mg by mouth 2 (two) times daily. (0800 & 2000)    [provider]  vitamin C (ASCORBIC ACID) 500 MG tablet Take 500 mg by mouth 2 (two) times daily. (0800 & 2000)    [provider]  Water For Irrigation, Sterile ([place]) Irrigate with as directed at bedtime. Use with CPAP machine at bedtime during sleep &  remove in the morning    [provider]  zolpidem (AMBIEN) 5 MG tablet Take 5 mg by mouth at bedtime. (2000)    [provider]     Allergies Doxycycline   Family History  Problem Relation Age of Onset  . Breast cancer Mother   . Lung cancer Mother   . Stomach cancer Father   . Diabetes Unknown        4 aunts, and 1 uncle  . Colon cancer Neg Hx   . Esophageal cancer Neg Hx   . Rectal cancer Neg Hx     Social History Social History  Substance Use Topics  . Smoking status: Former Smoker    Packs/day: 1.50    Years: 50.00    Types: Cigarettes    Quit date: 04/06/2013  . Smokeless tobacco: Never Used  . Alcohol use No    Review of Systems  Constitutional:   No fever or chills.  ENT:   No sore throat. No rhinorrhea. Cardiovascular:   No chest pain or syncope. Respiratory:   No dyspnea or cough. Gastrointestinal:   Negative for abdominal pain, vomiting and diarrhea.  Musculoskeletal:   Chronic right knee and right forearm pain. All other systems reviewed and are negative except as documented above in ROS and HPI.  ____________________________________________   PHYSICAL EXAM:  VITAL SIGNS: ED Triage Vitals  Enc Vitals Group     BP 12/09/16 1158 112/66     Pulse Rate 12/09/16 1158 85     Resp 12/09/16 1158 16     Temp 12/09/16 1158 98.6 F (37 C)     Temp Source 12/09/16 1158 Oral     SpO2 12/09/16 1158 98 %     Weight 12/09/16 1158 225 lb (102.1 kg)     Height 12/09/16 1158 5' 9"  (1.753 m)     Head Circumference --      Peak Flow --      Pain Score 12/09/16 1157 0     Pain Loc --      Pain Edu? --      Excl. in [place]? --     Vital signs reviewed, nursing assessments reviewed.   Constitutional:   Alert and oriented. Well appearing and in no distress. Eyes:   No scleral icterus.  EOMI. No nystagmus. No conjunctival pallor. PERRL. ENT    Head:   Normocephalic and atraumatic.   Nose:   No congestion/rhinnorhea.    Mouth/Throat:   Dry mucous membranes, no pharyngeal erythema. No peritonsillar mass.    Neck:   No meningismus. Full ROM Hematological/Lymphatic/Immunilogical:   No cervical lymphadenopathy. Cardiovascular:   RRR. Symmetric bilateral radial and DP pulses.  No murmurs.  Respiratory:   Normal respiratory effort without tachypnea/retractions. Breath sounds are clear and equal bilaterally. No wheezes/rales/rhonchi. Gastrointestinal:   Soft and nontender. Non distended. There is no CVA tenderness.  No rebound, rigidity, or guarding. Genitourinary:   deferred Musculoskeletal:   Normal range of motion in all extremities. No joint effusions. Positive tenderness diffusely about the right knee. No bony point tenderness, no pain with range of motion.  No edema. Neurologic:   Normal speech and language.  Motor grossly intact. No gross focal neurologic deficits are appreciated.  Skin:    Skin is warm, dry and intact. No rash noted.  No petechiae, purpura, or bullae.  ____________________________________________    LABS (pertinent positives/negatives) (all labs ordered are listed, but only abnormal results are displayed) Labs Reviewed  BASIC METABOLIC  PANEL - Abnormal; Notable for the following:       Result Value   Sodium 134 (*)    Chloride 96 (*)    Glucose, Bld 400 (*)    Calcium 8.8 (*)    All other components within normal limits  CBC - Abnormal; Notable for the following:    RDW 15.5 (*)    Platelets 102 (*)    All other components within normal limits  URINALYSIS, COMPLETE (UACMP) WITH MICROSCOPIC - Abnormal; Notable for the following:    Color, Urine STRAW (*)    APPearance CLEAR (*)    Specific Gravity, Urine 1.002 (*)    Glucose, UA >=500 (*)    Bacteria, UA RARE (*)    Squamous Epithelial / LPF 0-5 (*)    All other components within normal limits  GLUCOSE, CAPILLARY - Abnormal; Notable for the  following:    Glucose-Capillary 308 (*)    All other components within normal limits  CBG MONITORING, ED   ____________________________________________   EKG    ____________________________________________    RADIOLOGY  No results found.  ____________________________________________   PROCEDURES Procedures  ____________________________________________   INITIAL IMPRESSION / ASSESSMENT AND PLAN / ED COURSE  Pertinent labs & imaging results that were available during my care of the patient were reviewed by me and considered in my medical decision making (see chart for details).  Patient arrives for evaluation of hyperglycemia. Vital signs are normal. No other acute symptoms. Labs unremarkable, blood sugar 300 in the treatment room. No evidence of UTI. No acidosis or ketosis. Waiting for x-ray of the right knee due to the significant tenderness. Low suspicion for septic arthritis fracture or dislocation. Patient does appear dehydrated and was given IV fluids for this. Counseled on increasing her water intake, follow up with primary care.   ----------------------------------------- 2:53 PM on 12/09/2016 -----------------------------------------  Workup reassuring. Blood sugar improved to about 200 after IV fluids. Patient at baseline, calm and comfortable, normal vital signs. We'll discharge home to follow up with primary care on Monday.     ____________________________________________   FINAL CLINICAL IMPRESSION(S) / ED DIAGNOSES  Final diagnoses:  Hyperglycemia  Dehydration  Chronic kidney disease, unspecified CKD stage      New Prescriptions   No medications on file     Portions of this note were generated with dragon dictation software. Dictation errors may occur despite best attempts at proofreading.    Carrie Mew, MD 12/09/16 [phone]

## 2016-12-09 NOTE — ED Notes (Signed)
Staff called back stating found another staff member to come get patient. Requested patient be in lobby ready.  Discharge instructions reviewed and patient wheeled to lobby.

## 2016-12-09 NOTE — ED Notes (Signed)
Pt resting in bed, eyes closed, resp even and unlabored, denies any needs

## 2016-12-09 NOTE — ED Notes (Signed)
EDP at bedside  

## 2016-12-09 NOTE — ED Notes (Signed)
Patient attempted to call multiple rides without success of arranging transportation.  Patient requesting EMS transportation back.

## 2016-12-09 NOTE — ED Notes (Signed)
WAITING FOR TRANSPORTATION

## 2016-12-09 NOTE — ED Notes (Signed)
[facility] stated no transportation available for patient by facility.  Staff stated would contact family for possible transportation.

## 2016-12-09 NOTE — ED Triage Notes (Addendum)
Pt arrives via EMS from the [facility], pt states for 1 week her CBGS have been in the 400s, per facility this AM it read HIGH, EMS reports 461, pt takes metformin and 9 units humulog 4 times daily, states she had a total of 18 units already today, denies any pain upon arrival, alert and oriented

## 2016-12-15 ENCOUNTER — Encounter: Payer: Self-pay | Admitting: Emergency Medicine

## 2016-12-15 ENCOUNTER — Emergency Department
Admission: EM | Admit: 2016-12-15 | Discharge: 2016-12-16 | Disposition: A | Discharge status: Home / Self Care | Payer: Medicare Other | Attending: Emergency Medicine | Admitting: Emergency Medicine

## 2016-12-15 DIAGNOSIS — J45909 Unspecified asthma, uncomplicated: Secondary | ICD-10-CM | POA: Diagnosis not present

## 2016-12-15 DIAGNOSIS — Z79899 Other long term (current) drug therapy: Secondary | ICD-10-CM | POA: Insufficient documentation

## 2016-12-15 DIAGNOSIS — I10 Essential (primary) hypertension: Secondary | ICD-10-CM | POA: Diagnosis not present

## 2016-12-15 DIAGNOSIS — E1165 Type 2 diabetes mellitus with hyperglycemia: Secondary | ICD-10-CM | POA: Diagnosis not present

## 2016-12-15 DIAGNOSIS — R739 Hyperglycemia, unspecified: Secondary | ICD-10-CM | POA: Diagnosis present

## 2016-12-15 DIAGNOSIS — G8929 Other chronic pain: Secondary | ICD-10-CM | POA: Insufficient documentation

## 2016-12-15 DIAGNOSIS — Z794 Long term (current) use of insulin: Secondary | ICD-10-CM | POA: Insufficient documentation

## 2016-12-15 DIAGNOSIS — Z87891 Personal history of nicotine dependence: Secondary | ICD-10-CM | POA: Diagnosis not present

## 2016-12-15 LAB — CBC
HEMATOCRIT: 38.4 % (ref 35.0–47.0)
Hemoglobin: 12.8 g/dL (ref 12.0–16.0)
MCH: 31.1 pg (ref 26.0–34.0)
MCHC: 33.4 g/dL (ref 32.0–36.0)
MCV: 93 fL (ref 80.0–100.0)
PLATELETS: 103 10*3/uL — AB (ref 150–440)
RBC: 4.13 MIL/uL (ref 3.80–5.20)
RDW: 16 % — AB (ref 11.5–14.5)
WBC: 6 10*3/uL (ref 3.6–11.0)

## 2016-12-15 LAB — URINALYSIS, COMPLETE (UACMP) WITH MICROSCOPIC
BACTERIA UA: NONE SEEN
BILIRUBIN URINE: NEGATIVE
Hgb urine dipstick: NEGATIVE
Ketones, ur: NEGATIVE mg/dL
NITRITE: NEGATIVE
PH: 6 (ref 5.0–8.0)
Protein, ur: NEGATIVE mg/dL
SPECIFIC GRAVITY, URINE: 1.023 (ref 1.005–1.030)

## 2016-12-15 LAB — BASIC METABOLIC PANEL
Anion gap: 9 (ref 5–15)
BUN: 14 mg/dL (ref 6–20)
CO2: 29 mmol/L (ref 22–32)
Calcium: 9.1 mg/dL (ref 8.9–10.3)
Chloride: 95 mmol/L — ABNORMAL LOW (ref 101–111)
Creatinine, Ser: 0.97 mg/dL (ref 0.44–1.00)
GFR calc Af Amer: >60 mL/min (ref >60)
GFR, EST NON AFRICAN AMERICAN: 60 mL/min — AB (ref >60)
GLUCOSE: 566 mg/dL — AB (ref 65–99)
POTASSIUM: 3.8 mmol/L (ref 3.5–5.1)
Sodium: 133 mmol/L — ABNORMAL LOW (ref 135–145)

## 2016-12-15 LAB — GLUCOSE, CAPILLARY
GLUCOSE-CAPILLARY: 224 mg/dL — AB (ref 65–99)
Glucose-Capillary: 549 mg/dL (ref 65–99)

## 2016-12-15 MED ORDER — METOCLOPRAMIDE HCL 5 MG/ML IJ SOLN
10.0000 mg | Freq: Once | INTRAMUSCULAR | Status: AC
  Administered 2016-12-15: 10 mg via INTRAVENOUS
  Filled 2016-12-15: qty 2

## 2016-12-15 MED ORDER — SODIUM CHLORIDE 0.9 % IV BOLUS (SEPSIS)
1000.0000 mL | Freq: Once | INTRAVENOUS | Status: AC
  Administered 2016-12-15: 1000 mL via INTRAVENOUS

## 2016-12-15 MED ORDER — INSULIN ASPART 100 UNIT/ML ~~LOC~~ SOLN
10.0000 [IU] | Freq: Once | SUBCUTANEOUS | Status: AC
  Administered 2016-12-15: 10 [IU] via INTRAVENOUS
  Filled 2016-12-15: qty 1

## 2016-12-15 MED ORDER — ACETAMINOPHEN 500 MG PO TABS
1000.0000 mg | ORAL_TABLET | Freq: Once | ORAL | Status: AC
  Administered 2016-12-15: 1000 mg via ORAL
  Filled 2016-12-15: qty 2

## 2016-12-15 NOTE — ED Provider Notes (Signed)
[facility] Emergency Department Provider Note  ____________________________________________  Time seen: Approximately 10:57 PM  I have reviewed the triage vital signs and the nursing notes.   HISTORY  Chief Complaint Hyperglycemia   HPI Stephanie [name] is a 66 y.o. female with a history of diabetes who presents for evaluation of elevated blood glucose. Patient reportsthat her sugars have been elevated since the beginning of June. Dr. making house calls visits her at the [facility] and have been adjusting her insulin. Today she was noted to have a blood glucose of 586 which prompted visit to the emergency room. She was given 20 units of insulin prior to transfer. Patient is complaining of a frontal headache, moderate and constant. Patient reports she always has headaches when her sugars are high. No fever or neck stiffness, no nausea or vomiting, no chest pain or shortness of breath, no cough or congestion, no dysuria or hematuria.  Past Medical History:  Diagnosis Date  . Adenomatous colon polyp 08/27/2014   Polyps x 3  . Anemia   . Anxiety   . 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 Arthritis   . Asthma   . Bipolar affective disorder ([place])    h/o  . Blood transfusion 2000  . Blood transfusion without reported diagnosis   . Chronic abdominal pain    abdominal wall pain  . 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 Colon polyp   . Depression   . Diabetes mellitus without complication ([place])   . Fatty liver 04/09/08   found in abd CT  . GERD (gastroesophageal reflux disease)   . H/O: CVA (cardiovascular accident)    TIA  . H/O: rheumatic fever   . Hepatitis    HEP "B" twenty years ago  . Hyperlipidemia   . Incontinence   . Insomnia   . Lower extremity edema   . Obstructive sleep apnea    Use C-PAP twice per week  . Platelets decreased ([place])   . RLS (restless legs syndrome)   . Shortness of breath   . Sleep apnea   . Stroke [facility])    TIA-  2002  . Ulcer   . Unspecified disorders of nervous system     Patient Active Problem List   Diagnosis Date Noted  . Encephalopathy 07/19/2016  . Lymphedema 06/14/2016  . Pain in limb 05/06/2016  . Swelling of limb 05/06/2016  . Postphlebitic syndrome with inflammation 05/06/2016  . Pneumonia 04/18/2016  . Chest pain 04/04/2016  . Iron deficiency anemia 01/30/2016  . Hypoglycemia 09/09/2015  . Hyperglycemia 07/16/2015  . Neuropathy 06/08/2015  . Thrombocytopenia ([place]) 08/19/2014  . Abdominal pain, chronic, epigastric 06/12/2014  . Liver cirrhosis secondary to NASH ([place]) 02/27/2014  . Dizziness and giddiness 10/30/2013  . DOE (dyspnea on exertion) 10/30/2013  . Poorly controlled type 2 diabetes mellitus with complication ([place]) 58/85/0277  . DNR (do not resuscitate) 07/29/2013  . Encounter for routine gynecological examination 07/29/2013  . Dyspnea 07/10/2013  . Lung nodule 07/10/2013  . Varicose veins of left lower extremity 10/09/2012  . GERD (gastroesophageal reflux disease) 10/09/2012  . Cirrhosis of liver ([place]) 02/02/2012  . Anxiety 07/11/2011  . Aortic dissection ([place])   . BACK PAIN, LUMBAR, CHRONIC 11/19/2009  . UNS ADVRS EFF OTH RX MEDICINAL&BIOLOGICAL SBSTNC 12/16/2008  . UNSPECIFIED VITAMIN D DEFICIENCY 09/26/2008  . TOBACCO ABUSE 06/19/2008  . RESTLESS LEGS SYNDROME 04/27/2007  . INSOMNIA 04/27/2007  . HLD (  hyperlipidemia) 03/08/2007  . OSA (obstructive sleep apnea) 03/08/2007  . Essential hypertension 03/08/2007  . Asthma 03/08/2007  . BIPOLAR AFFECTIVE DISORDER, HX OF 03/08/2007    Past Surgical History:  Procedure Laterality Date  . ABDOMINAL EXPLORATION SURGERY    . ABDOMINAL HYSTERECTOMY     total  . Axillary artery cannulation via 8-mm Hemashield graft, median sternotomy, extracorporeal circulation with deep hypothermic circulatory arrest, repair of  aortic dessection  01/23/2009   Dr Arlyce Dice  . BACK SURGERY      x 5  . cataract     both eyes  .  CHOLECYSTECTOMY    . COLONOSCOPY    . EYE SURGERY Bilateral    Cataract Extraction with IOL  . HEMORROIDECTOMY     and colon polyp removed  . KNEE ARTHROSCOPY WITH LATERAL RELEASE Right 11/15/2016   Procedure: KNEE ARTHROSCOPY WITH LATERAL RELEASE;  Surgeon: Corky Mull, MD;  Location: [facility] ORS;  Service: Orthopedics;  Laterality: Right;  . LIVER BIOPSY  04/10/2012   Procedure: LIVER BIOPSY;  Surgeon: Inda [place], MD;  Location: [facility];  Service: Endoscopy;  Laterality: N/A;  ultrasound to mark order for abd limited/liver to be marked to be sent by [email or web address]   . LUMBAR FUSION  10/09  . LUMBAR WOUND DEBRIDEMENT  05/09/2011   Procedure: LUMBAR WOUND DEBRIDEMENT;  Surgeon: Dahlia Bailiff;  Location: [place];  Service: Orthopedics;  Laterality: N/A;  IRRIGATION AND DEBRIDEMENT SPINAL WOUND  . POSTERIOR CERVICAL FUSION/FORAMINOTOMY    . ROTATOR CUFF REPAIR     left    Prior to Admission medications   Medication Sig Start Date End Date Taking? Authorizing Provider  acetaminophen (TYLENOL) 325 MG tablet Take 650 mg by mouth every 4 (four) hours as needed (for fever, minor discomfort, or headache).    Yes [provider]  albuterol (PROVENTIL HFA;VENTOLIN HFA) 108 (90 Base) MCG/ACT inhaler Inhale 2 puffs into the lungs every 6 (six) hours as needed for wheezing or shortness of breath. 07/28/16  Yes Alfred Levins, [place], MD  albuterol (PROVENTIL) (2.5 MG/3ML) 0.083% nebulizer solution Take 2.5 mg by nebulization every 6 (six) hours as needed for wheezing or shortness of breath.   Yes [provider]  alum & mag hydroxide-simeth (ANTACID LIQUID) 200-200-20 MG/5ML suspension Take 30 mLs by mouth every 6 (six) hours as needed for indigestion or heartburn. *NOTIFY MD IF SYMPTOMS PERSISTS FOR MORE THAN 3 DAYS.*   Yes [provider]  ARIPiprazole (ABILIFY) 30 MG tablet Take 30 mg by mouth daily.   Yes [provider]  barrier cream (NON-SPECIFIED) CREA Apply 1  application topically as needed (APPLIED AFTER TOILETING TO PREVENT SKIN BREAKDOWN).   Yes [provider]  budesonide-formoterol (SYMBICORT) 160-4.5 MCG/ACT inhaler Inhale 2 puffs into the lungs 2 (two) times daily. (0800 & 2000)   Yes [provider]  buPROPion (WELLBUTRIN SR) 150 MG 12 hr tablet Take 150 mg by mouth daily. (0800)   Yes [provider]  clonazePAM (KLONOPIN) 0.5 MG tablet Take 0.25 mg by mouth 3 (three) times daily. (0800, 1300 & 2000)   Yes [provider]  dicyclomine (BENTYL) 10 MG capsule Take 10 mg by mouth 3 (three) times daily. (0800, 1300 & 2000)   Yes [provider]  diphenhydrAMINE (DIPHENHIST) 25 mg capsule Take 25 mg by mouth every 6 (six) hours as needed (for allergic reaction (notify provider is symptoms persist more than 24 hrs*).   Yes [provider]  Emollient (EUCERIN  INTENSIVE REPAIR) LOTN Apply 1 application topically 2 (two) times daily.   Yes [provider]  ferrous sulfate 325 (65 FE) MG tablet Take 325 mg by mouth 2 (two) times daily. (0800 & 2000)   Yes [provider]  furosemide (LASIX) 80 MG tablet Take 80 mg by mouth daily. (0800)   Yes [provider]  gabapentin (NEURONTIN) 600 MG tablet Take 600 mg by mouth 2 (two) times daily. (0800 & 2000)   Yes [provider]  guaifenesin (ROBITUSSIN) 100 MG/5ML syrup Take 300 mg by mouth every 6 (six) hours as needed for cough.    Yes [provider]  insulin lispro (HUMALOG KWIKPEN) 100 UNIT/ML KiwkPen Inject 0-9 Units into the skin 3 (three) times daily. Check FSBS before each meal & cover with SSI (sliding scale insulin)   Yes [provider]  lactulose (CHRONULAC) 10 GM/15ML solution Take 10 g by mouth 3 (three) times a week. 10/17/16  Yes [provider]  lamoTRIgine (LAMICTAL) 100 MG tablet Take 100 mg by mouth 2 (two) times daily. (0800 & 2000)   Yes [provider]  loperamide  (IMODIUM) 2 MG capsule Take 4 mg by mouth as needed for diarrhea or loose stools. *Do not exceed 8 doses in 24 hours*   Yes [provider]  LYRICA 150 MG capsule Take 150 mg by mouth 3 (three) times daily. 11/29/16  Yes [provider]  magnesium hydroxide (MILK OF MAGNESIA) 400 MG/5ML suspension Take 30 mLs by mouth daily as needed (for constipation).   Yes [provider]  Menthol-Zinc Oxide (GOLD BOND EX) Apply 1 application topically daily.   Yes [provider]  metoCLOPramide (REGLAN) 5 MG tablet Take 5 mg by mouth 4 (four) times daily -  before meals and at bedtime. (0800, 1200, 1600 & 2000)   Yes [provider]  mirtazapine (REMERON) 15 MG tablet Take 45 mg by mouth at bedtime. (2000)    Yes [provider]  omeprazole (PRILOSEC) 20 MG capsule Take 20 mg by mouth daily before breakfast. (0630)   Yes [provider]  ondansetron (ZOFRAN) 4 MG tablet Take 4 mg by mouth every 4 (four) hours as needed for nausea or vomiting.   Yes [provider]  oxybutynin (DITROPAN-XL) 5 MG 24 hr tablet Take 5 mg by mouth daily. (0800)   Yes [provider]  oxyCODONE (ROXICODONE) 5 MG immediate release tablet Take 1 tablet (5 mg total) by mouth every 4 (four) hours as needed for severe pain. 11/15/16  Yes Poggi, Marshall Cork, MD  potassium chloride SA (K-DUR,KLOR-CON) 20 MEQ tablet Take 20 mEq by mouth daily. (0800)   Yes [provider]  pyrithione zinc (HEAD AND SHOULDERS) 1 % shampoo Apply 1 application topically daily. t-gel or head and shoulders.   Yes [provider]  rOPINIRole (REQUIP) 3 MG tablet Take 3 mg by mouth at bedtime. (2000)   Yes [provider]  simvastatin (ZOCOR) 10 MG tablet Take 10 mg by mouth at bedtime. (2000)   Yes [provider]  spironolactone (ALDACTONE) 25 MG tablet Take 25 mg by mouth daily. (0800)   Yes [provider]  traMADol (ULTRAM) 50 MG tablet Take 50  mg by mouth 2 (two) times daily. (0800 & 2000)   Yes [provider]  vitamin C (ASCORBIC ACID) 500 MG tablet Take 500 mg by mouth 2 (two) times daily. (0800 & 2000)   Yes [provider]  Water For Irrigation, Sterile (STERILE WATER FOR IRRIGATION) Irrigate with as directed at bedtime. Use with CPAP machine at bedtime during sleep & remove in the morning   Yes [provider]  zolpidem (AMBIEN) 5 MG tablet Take 5 mg by mouth at bedtime. (2000)   Yes [provider]    Allergies Doxycycline  Family History  Problem Relation Age of Onset  . Breast cancer Mother   . Lung cancer Mother   . Stomach cancer Father   . Diabetes Unknown        4 aunts, and 1 uncle  . Colon cancer Neg Hx   . Esophageal cancer Neg Hx   . Rectal cancer Neg Hx     Social History Social History  Substance Use Topics  . Smoking status: Former Smoker    Packs/day: 1.50    Years: 50.00    Types: Cigarettes    Quit date: 04/06/2013  . Smokeless tobacco: Never Used  . Alcohol use No    Review of Systems  Constitutional: Negative for fever. Eyes: Negative for visual changes. ENT: Negative for sore throat. Neck: No neck pain  Cardiovascular: Negative for chest pain. Respiratory: Negative for shortness of breath. Gastrointestinal: Negative for abdominal pain, vomiting or diarrhea. Genitourinary: Negative for dysuria. Musculoskeletal: Negative for back pain. Skin: Negative for rash. Neurological: Negative for weakness or numbness. + HA Psych: No SI or HI  ____________________________________________   PHYSICAL EXAM:  VITAL SIGNS: ED Triage Vitals  Enc Vitals Group     BP 12/15/16 2214 (!) 128/59     Pulse Rate 12/15/16 2214 95     Resp 12/15/16 2214 18     Temp 12/15/16 2214 98.2 F (36.8 C)     Temp Source 12/15/16 2214 Oral     SpO2 12/15/16 2214 97 %     Weight 12/15/16 2210 222 lb (100.7 kg)     Height 12/15/16 2210 5' 3"  (1.6 m)     Head Circumference  --      Peak Flow --      Pain Score 12/15/16 2210 8     Pain Loc --      Pain Edu? --      Excl. in [place]? --     Constitutional: Alert and oriented. Well appearing and in no apparent distress. HEENT:      Head: Normocephalic and atraumatic.         Eyes: Conjunctivae are normal. Sclera is non-icteric.       Mouth/Throat: Mucous membranes are dry.       Neck: Supple with no signs of meningismus. Cardiovascular: Regular rate and rhythm. No murmurs, gallops, or rubs. 2+ symmetrical distal pulses are present in all extremities. No JVD. Respiratory: Normal respiratory effort. Lungs are clear to auscultation bilaterally. No wheezes, crackles, or rhonchi.  Gastrointestinal: Soft, non tender, and non distended with positive bowel sounds. No rebound or guarding. Musculoskeletal: Nontender with normal range of motion in all extremities. No edema, cyanosis, or erythema of extremities. Neurologic: Normal speech and language. Face is symmetric. Moving all extremities. No gross focal neurologic deficits are appreciated. Skin: Skin is warm, dry and intact. No rash noted. Psychiatric: Mood and affect are normal. Speech and behavior are normal.  ____________________________________________   LABS (all labs ordered are listed, but only abnormal results are displayed)  Labs Reviewed  BASIC METABOLIC PANEL - Abnormal; Notable for the following:       Result Value   Sodium 133 (*)  Chloride 95 (*)    Glucose, Bld 566 (*)    GFR calc non Af Amer 60 (*)    All other components within normal limits  CBC - Abnormal; Notable for the following:    RDW 16.0 (*)    Platelets 103 (*)    All other components within normal limits  URINALYSIS, COMPLETE (UACMP) WITH MICROSCOPIC - Abnormal; Notable for the following:    Color, Urine STRAW (*)    APPearance CLEAR (*)    Glucose, UA >=500 (*)    Leukocytes, UA TRACE (*)    Squamous Epithelial / LPF 0-5 (*)    All other components within normal limits    GLUCOSE, CAPILLARY - Abnormal; Notable for the following:    Glucose-Capillary 549 (*)    All other components within normal limits  GLUCOSE, CAPILLARY - Abnormal; Notable for the following:    Glucose-Capillary 224 (*)    All other components within normal limits  URINE CULTURE  CBG MONITORING, ED   ____________________________________________  EKG  ED ECG REPORT I, Rudene Re, the attending physician, personally viewed and interpreted this ECG.  Normal sinus rhythm, rate of 98, normal intervals, normal axis, no ST elevations or depressions.  ____________________________________________  RADIOLOGY  none ____________________________________________   PROCEDURES  Procedure(s) performed: None Procedures Critical Care performed:  None ____________________________________________   INITIAL IMPRESSION / ASSESSMENT AND PLAN / ED COURSE  66 y.o. female with a history of diabetes who presents for evaluation of elevated blood glucose. Patient has had persistently elevated blood glucose for the last month and a half with multiple adjustments of her insulin regimen. She has no signs or symptoms of infection. She looks slightly dry and exam. Blood work showing hyperglycemia with no evidence of DKA with a normal bicarbonate, normal anion gap, normal kidney function. UA is pending. We'll give a liter of IV fluid, 10U of IV insulin and repeat BG.    _________________________ 11:59 PM on 12/15/2016 -----------------------------------------  No evidence of DKA. Repeat blood glucose at 224. Patient is tolerating by mouth. Urinalysis with no acute tones are urinary tract infection. She is current be discharged back to her facility with close evaluation by doctors making house calls.  Pertinent labs & imaging results that were available during my care of the patient were reviewed by me and considered in my medical decision making (see chart for  details).    ____________________________________________   FINAL CLINICAL IMPRESSION(S) / ED DIAGNOSES  Final diagnoses:  Type 2 diabetes mellitus with hyperglycemia, with long-term current use of insulin (HCC)      NEW MEDICATIONS STARTED DURING THIS VISIT:  New Prescriptions   No medications on file     Note:  This document was prepared using Dragon voice recognition software and may include unintentional dictation errors.    Alfred Levins, [place], MD 12/16/16 0000

## 2016-12-15 NOTE — ED Notes (Signed)
Patient ambulated to restroom with no difficulty noted.

## 2016-12-15 NOTE — ED Notes (Signed)
CBG 224.  MD notified

## 2016-12-15 NOTE — ED Triage Notes (Signed)
Pt comes into the ED via EMS from [facility] with c/o hyperglycemia with a CBG of 586 after 28 units of insulin.  Initial reading on the [facility] monitor was "high".  Patient denies any pain other than a headache.  Patient states her sugar has been running high since her knee surgery in June.  Patient denies chest pain, shortness of breath, or weakness.

## 2016-12-17 LAB — URINE CULTURE: Culture: 50000 — AB

## 2016-12-21 ENCOUNTER — Observation Stay
Admission: EM | Admit: 2016-12-21 | Discharge: 2016-12-22 | Disposition: A | Discharge status: Home / Self Care | Payer: Medicare Other | Attending: Internal Medicine | Admitting: Internal Medicine

## 2016-12-21 ENCOUNTER — Emergency Department: Payer: Medicare Other

## 2016-12-21 DIAGNOSIS — Z87891 Personal history of nicotine dependence: Secondary | ICD-10-CM | POA: Diagnosis not present

## 2016-12-21 DIAGNOSIS — R079 Chest pain, unspecified: Secondary | ICD-10-CM | POA: Diagnosis present

## 2016-12-21 DIAGNOSIS — E785 Hyperlipidemia, unspecified: Secondary | ICD-10-CM | POA: Insufficient documentation

## 2016-12-21 DIAGNOSIS — K7460 Unspecified cirrhosis of liver: Secondary | ICD-10-CM | POA: Diagnosis not present

## 2016-12-21 DIAGNOSIS — Z79899 Other long term (current) drug therapy: Secondary | ICD-10-CM | POA: Diagnosis not present

## 2016-12-21 DIAGNOSIS — Z794 Long term (current) use of insulin: Secondary | ICD-10-CM | POA: Insufficient documentation

## 2016-12-21 DIAGNOSIS — Z6839 Body mass index (BMI) 39.0-39.9, adult: Secondary | ICD-10-CM | POA: Diagnosis not present

## 2016-12-21 DIAGNOSIS — Z7951 Long term (current) use of inhaled steroids: Secondary | ICD-10-CM | POA: Insufficient documentation

## 2016-12-21 DIAGNOSIS — G2581 Restless legs syndrome: Secondary | ICD-10-CM | POA: Insufficient documentation

## 2016-12-21 DIAGNOSIS — I10 Essential (primary) hypertension: Secondary | ICD-10-CM | POA: Diagnosis not present

## 2016-12-21 DIAGNOSIS — F419 Anxiety disorder, unspecified: Secondary | ICD-10-CM | POA: Insufficient documentation

## 2016-12-21 DIAGNOSIS — K219 Gastro-esophageal reflux disease without esophagitis: Secondary | ICD-10-CM | POA: Insufficient documentation

## 2016-12-21 DIAGNOSIS — E1140 Type 2 diabetes mellitus with diabetic neuropathy, unspecified: Secondary | ICD-10-CM | POA: Insufficient documentation

## 2016-12-21 DIAGNOSIS — Z8673 Personal history of transient ischemic attack (TIA), and cerebral infarction without residual deficits: Secondary | ICD-10-CM | POA: Insufficient documentation

## 2016-12-21 DIAGNOSIS — R072 Precordial pain: Secondary | ICD-10-CM | POA: Diagnosis not present

## 2016-12-21 DIAGNOSIS — R0602 Shortness of breath: Secondary | ICD-10-CM | POA: Diagnosis not present

## 2016-12-21 DIAGNOSIS — F319 Bipolar disorder, unspecified: Secondary | ICD-10-CM | POA: Insufficient documentation

## 2016-12-21 DIAGNOSIS — J449 Chronic obstructive pulmonary disease, unspecified: Secondary | ICD-10-CM | POA: Insufficient documentation

## 2016-12-21 DIAGNOSIS — Z66 Do not resuscitate: Secondary | ICD-10-CM | POA: Insufficient documentation

## 2016-12-21 DIAGNOSIS — E669 Obesity, unspecified: Secondary | ICD-10-CM | POA: Insufficient documentation

## 2016-12-21 DIAGNOSIS — G4733 Obstructive sleep apnea (adult) (pediatric): Secondary | ICD-10-CM | POA: Insufficient documentation

## 2016-12-21 HISTORY — DX: Malignant (primary) neoplasm, unspecified: C80.1

## 2016-12-21 LAB — CBC
HEMATOCRIT: 37.5 % (ref 35.0–47.0)
HEMOGLOBIN: 12.8 g/dL (ref 12.0–16.0)
MCH: 31.5 pg (ref 26.0–34.0)
MCHC: 34.2 g/dL (ref 32.0–36.0)
MCV: 92 fL (ref 80.0–100.0)
Platelets: 100 10*3/uL — ABNORMAL LOW (ref 150–440)
RBC: 4.07 MIL/uL (ref 3.80–5.20)
RDW: 15.8 % — ABNORMAL HIGH (ref 11.5–14.5)
WBC: 6.7 10*3/uL (ref 3.6–11.0)

## 2016-12-21 LAB — BASIC METABOLIC PANEL
ANION GAP: 7 (ref 5–15)
BUN: 20 mg/dL (ref 6–20)
CO2: 28 mmol/L (ref 22–32)
Calcium: 9.1 mg/dL (ref 8.9–10.3)
Chloride: 102 mmol/L (ref 101–111)
Creatinine, Ser: 0.76 mg/dL (ref 0.44–1.00)
GFR calc Af Amer: >60 mL/min (ref >60)
Glucose, Bld: 250 mg/dL — ABNORMAL HIGH (ref 65–99)
POTASSIUM: 4.3 mmol/L (ref 3.5–5.1)
Sodium: 137 mmol/L (ref 135–145)

## 2016-12-21 LAB — GLUCOSE, CAPILLARY
GLUCOSE-CAPILLARY: 264 mg/dL — AB (ref 65–99)
GLUCOSE-CAPILLARY: 262 mg/dL — AB (ref 65–99)

## 2016-12-21 LAB — TROPONIN I: Troponin I: <0.03 ng/mL (ref <0.03)

## 2016-12-21 LAB — FIBRIN DERIVATIVES D-DIMER (ARMC ONLY): FIBRIN DERIVATIVES D-DIMER (ARMC): 624.31 — AB (ref 0.00–499.00)

## 2016-12-21 LAB — MRSA PCR SCREENING: MRSA by PCR: NEGATIVE

## 2016-12-21 MED ORDER — ALUM & MAG HYDROXIDE-SIMETH 200-200-20 MG/5ML PO SUSP: 30.0000 mL | Freq: Four times a day (QID) | ORAL | Status: DC | PRN

## 2016-12-21 MED ORDER — NITROGLYCERIN 2 % TD OINT
1.0000 [in_us] | TOPICAL_OINTMENT | Freq: Once | TRANSDERMAL | Status: AC
  Administered 2016-12-21: 1 [in_us] via TOPICAL
  Filled 2016-12-21: qty 1

## 2016-12-21 MED ORDER — IOPAMIDOL (ISOVUE-370) INJECTION 76%
75.0000 mL | Freq: Once | INTRAVENOUS | Status: AC | PRN
  Administered 2016-12-21: 75 mL via INTRAVENOUS

## 2016-12-21 MED ORDER — METOCLOPRAMIDE HCL 5 MG PO TABS
5.0000 mg | ORAL_TABLET | Freq: Three times a day (TID) | ORAL | Status: DC
  Administered 2016-12-21 – 2016-12-22 (×4): 5 mg via ORAL
  Filled 2016-12-21 (×4): qty 1

## 2016-12-21 MED ORDER — ZOLPIDEM TARTRATE 5 MG PO TABS: 5.0000 mg | ORAL_TABLET | Freq: Every evening | ORAL | Status: DC | PRN

## 2016-12-21 MED ORDER — TRAMADOL HCL 50 MG PO TABS
50.0000 mg | ORAL_TABLET | Freq: Two times a day (BID) | ORAL | Status: DC
  Administered 2016-12-21 – 2016-12-22 (×3): 50 mg via ORAL
  Filled 2016-12-21 (×3): qty 1

## 2016-12-21 MED ORDER — NITROGLYCERIN 0.4 MG SL SUBL
0.4000 mg | SUBLINGUAL_TABLET | SUBLINGUAL | Status: DC | PRN
  Administered 2016-12-21: 0.4 mg via SUBLINGUAL
  Filled 2016-12-21: qty 1

## 2016-12-21 MED ORDER — ONDANSETRON HCL 4 MG PO TABS: 4.0000 mg | ORAL_TABLET | ORAL | Status: DC | PRN

## 2016-12-21 MED ORDER — INSULIN ASPART 100 UNIT/ML ~~LOC~~ SOLN
0.0000 [IU] | Freq: Three times a day (TID) | SUBCUTANEOUS | Status: DC
  Administered 2016-12-21: 5 [IU] via SUBCUTANEOUS
  Filled 2016-12-21: qty 1

## 2016-12-21 MED ORDER — PANTOPRAZOLE SODIUM 40 MG PO TBEC
40.0000 mg | DELAYED_RELEASE_TABLET | Freq: Every day | ORAL | Status: DC
  Administered 2016-12-22: 40 mg via ORAL
  Filled 2016-12-21: qty 1

## 2016-12-21 MED ORDER — MIRTAZAPINE 15 MG PO TABS
45.0000 mg | ORAL_TABLET | Freq: Every day | ORAL | Status: DC
  Administered 2016-12-21: 45 mg via ORAL
  Filled 2016-12-21: qty 3

## 2016-12-21 MED ORDER — CLONAZEPAM 0.5 MG PO TABS
0.2500 mg | ORAL_TABLET | Freq: Three times a day (TID) | ORAL | Status: DC
  Administered 2016-12-21 – 2016-12-22 (×3): 0.25 mg via ORAL
  Filled 2016-12-21 (×3): qty 1

## 2016-12-21 MED ORDER — ONDANSETRON HCL 4 MG/2ML IJ SOLN
4.0000 mg | Freq: Once | INTRAMUSCULAR | Status: AC
  Administered 2016-12-21: 4 mg via INTRAVENOUS

## 2016-12-21 MED ORDER — FERROUS SULFATE 325 (65 FE) MG PO TABS
325.0000 mg | ORAL_TABLET | Freq: Two times a day (BID) | ORAL | Status: DC
  Administered 2016-12-21 – 2016-12-22 (×3): 325 mg via ORAL
  Filled 2016-12-21 (×3): qty 1

## 2016-12-21 MED ORDER — POTASSIUM CHLORIDE CRYS ER 20 MEQ PO TBCR
20.0000 meq | EXTENDED_RELEASE_TABLET | Freq: Every day | ORAL | Status: DC
  Administered 2016-12-21 – 2016-12-22 (×2): 20 meq via ORAL
  Filled 2016-12-21 (×2): qty 1

## 2016-12-21 MED ORDER — ACETAMINOPHEN 325 MG PO TABS
650.0000 mg | ORAL_TABLET | ORAL | Status: DC | PRN
  Administered 2016-12-21: 650 mg via ORAL
  Filled 2016-12-21: qty 2

## 2016-12-21 MED ORDER — ONDANSETRON HCL 4 MG/2ML IJ SOLN: 4.0000 mg | Freq: Four times a day (QID) | INTRAMUSCULAR | Status: DC | PRN

## 2016-12-21 MED ORDER — MOMETASONE FURO-FORMOTEROL FUM 200-5 MCG/ACT IN AERO
2.0000 | INHALATION_SPRAY | Freq: Two times a day (BID) | RESPIRATORY_TRACT | Status: DC
  Administered 2016-12-21 – 2016-12-22 (×3): 2 via RESPIRATORY_TRACT
  Filled 2016-12-21: qty 8.8

## 2016-12-21 MED ORDER — INSULIN ASPART 100 UNIT/ML ~~LOC~~ SOLN
0.0000 [IU] | Freq: Every day | SUBCUTANEOUS | Status: DC
  Administered 2016-12-21: 3 [IU] via SUBCUTANEOUS
  Filled 2016-12-21: qty 1

## 2016-12-21 MED ORDER — SODIUM CHLORIDE 0.9 % IV BOLUS (SEPSIS)
1000.0000 mL | Freq: Once | INTRAVENOUS | Status: AC
  Administered 2016-12-21: 1000 mL via INTRAVENOUS

## 2016-12-21 MED ORDER — OXYCODONE HCL 5 MG PO TABS
5.0000 mg | ORAL_TABLET | ORAL | Status: DC | PRN
  Administered 2016-12-21: 5 mg via ORAL
  Filled 2016-12-21: qty 1

## 2016-12-21 MED ORDER — DICYCLOMINE HCL 10 MG PO CAPS
10.0000 mg | ORAL_CAPSULE | Freq: Three times a day (TID) | ORAL | Status: DC
  Administered 2016-12-21 – 2016-12-22 (×2): 10 mg via ORAL
  Filled 2016-12-21 (×2): qty 1

## 2016-12-21 MED ORDER — ASPIRIN EC 325 MG PO TBEC
325.0000 mg | DELAYED_RELEASE_TABLET | Freq: Every day | ORAL | Status: DC
  Administered 2016-12-22: 325 mg via ORAL
  Filled 2016-12-21: qty 1

## 2016-12-21 MED ORDER — DIPHENHYDRAMINE HCL 25 MG PO CAPS: 25.0000 mg | ORAL_CAPSULE | Freq: Four times a day (QID) | ORAL | Status: DC | PRN

## 2016-12-21 MED ORDER — BUPROPION HCL ER (SR) 150 MG PO TB12
150.0000 mg | ORAL_TABLET | Freq: Every day | ORAL | Status: DC
  Administered 2016-12-21 – 2016-12-22 (×2): 150 mg via ORAL
  Filled 2016-12-21 (×2): qty 1

## 2016-12-21 MED ORDER — ONDANSETRON HCL 4 MG/2ML IJ SOLN
4.0000 mg | Freq: Once | INTRAMUSCULAR | Status: AC
  Administered 2016-12-21: 4 mg via INTRAVENOUS
  Filled 2016-12-21: qty 2

## 2016-12-21 MED ORDER — ASPIRIN 81 MG PO CHEW
324.0000 mg | CHEWABLE_TABLET | Freq: Once | ORAL | Status: AC
  Administered 2016-12-21: 324 mg via ORAL
  Filled 2016-12-21: qty 4

## 2016-12-21 MED ORDER — ALPRAZOLAM 0.25 MG PO TABS: 0.2500 mg | ORAL_TABLET | Freq: Two times a day (BID) | ORAL | Status: DC | PRN

## 2016-12-21 MED ORDER — MORPHINE SULFATE (PF) 2 MG/ML IV SOLN
2.0000 mg | Freq: Once | INTRAVENOUS | Status: AC
  Administered 2016-12-21: 2 mg via INTRAVENOUS
  Filled 2016-12-21: qty 1

## 2016-12-21 MED ORDER — ROPINIROLE HCL 1 MG PO TABS
3.0000 mg | ORAL_TABLET | Freq: Every day | ORAL | Status: DC
  Administered 2016-12-21: 3 mg via ORAL
  Filled 2016-12-21: qty 3

## 2016-12-21 MED ORDER — DICLOFENAC SODIUM 1 % TD GEL
2.0000 g | Freq: Three times a day (TID) | TRANSDERMAL | Status: DC | PRN
  Administered 2016-12-21: 2 g via TOPICAL
  Filled 2016-12-21 (×2): qty 100

## 2016-12-21 MED ORDER — MORPHINE SULFATE (PF) 2 MG/ML IV SOLN
2.0000 mg | INTRAVENOUS | Status: DC | PRN
  Administered 2016-12-21: 2 mg via INTRAVENOUS
  Filled 2016-12-21: qty 1

## 2016-12-21 MED ORDER — GUAIFENESIN 100 MG/5ML PO SYRP
300.0000 mg | ORAL_SOLUTION | Freq: Four times a day (QID) | ORAL | Status: DC | PRN
  Filled 2016-12-21: qty 15

## 2016-12-21 MED ORDER — ALBUTEROL SULFATE HFA 108 (90 BASE) MCG/ACT IN AERS: 2.0000 | INHALATION_SPRAY | Freq: Four times a day (QID) | RESPIRATORY_TRACT | Status: DC | PRN

## 2016-12-21 MED ORDER — ATORVASTATIN CALCIUM 20 MG PO TABS
40.0000 mg | ORAL_TABLET | Freq: Every day | ORAL | Status: DC
Start: 2016-12-21 — End: 2016-12-22
  Administered 2016-12-21: 40 mg via ORAL
  Filled 2016-12-21: qty 2

## 2016-12-21 MED ORDER — OXYBUTYNIN CHLORIDE ER 5 MG PO TB24
5.0000 mg | ORAL_TABLET | Freq: Every day | ORAL | Status: DC
  Administered 2016-12-21 – 2016-12-22 (×2): 5 mg via ORAL
  Filled 2016-12-21 (×2): qty 1

## 2016-12-21 MED ORDER — GABAPENTIN 600 MG PO TABS
600.0000 mg | ORAL_TABLET | Freq: Two times a day (BID) | ORAL | Status: DC
  Administered 2016-12-21 – 2016-12-22 (×3): 600 mg via ORAL
  Filled 2016-12-21 (×3): qty 1

## 2016-12-21 MED ORDER — GI COCKTAIL ~~LOC~~
30.0000 mL | Freq: Four times a day (QID) | ORAL | Status: DC | PRN
  Filled 2016-12-21: qty 30

## 2016-12-21 MED ORDER — LORAZEPAM 0.5 MG PO TABS: 0.5000 mg | ORAL_TABLET | Freq: Every day | ORAL | Status: DC | PRN

## 2016-12-21 MED ORDER — PYRITHIONE ZINC 1 % EX SHAM: 1.0000 "application " | MEDICATED_SHAMPOO | Freq: Every day | CUTANEOUS | Status: DC

## 2016-12-21 MED ORDER — LAMOTRIGINE 100 MG PO TABS
100.0000 mg | ORAL_TABLET | Freq: Two times a day (BID) | ORAL | Status: DC
  Administered 2016-12-21 – 2016-12-22 (×3): 100 mg via ORAL
  Filled 2016-12-21 (×3): qty 1

## 2016-12-21 MED ORDER — MAGNESIUM HYDROXIDE 400 MG/5ML PO SUSP: 30.0000 mL | Freq: Every day | ORAL | Status: DC | PRN

## 2016-12-21 MED ORDER — ENOXAPARIN SODIUM 40 MG/0.4ML ~~LOC~~ SOLN
40.0000 mg | SUBCUTANEOUS | Status: DC
  Administered 2016-12-21: 40 mg via SUBCUTANEOUS
  Filled 2016-12-21: qty 0.4

## 2016-12-21 MED ORDER — INSULIN GLARGINE 100 UNIT/ML ~~LOC~~ SOLN
28.0000 [IU] | Freq: Every day | SUBCUTANEOUS | Status: DC
  Administered 2016-12-21: 28 [IU] via SUBCUTANEOUS
  Filled 2016-12-21 (×2): qty 0.28

## 2016-12-21 MED ORDER — FUROSEMIDE 40 MG PO TABS
80.0000 mg | ORAL_TABLET | Freq: Every day | ORAL | Status: DC
  Administered 2016-12-21 – 2016-12-22 (×2): 80 mg via ORAL
  Filled 2016-12-21 (×2): qty 2

## 2016-12-21 MED ORDER — ACETAMINOPHEN 325 MG PO TABS: 650.0000 mg | ORAL_TABLET | ORAL | Status: DC | PRN

## 2016-12-21 MED ORDER — SPIRONOLACTONE 25 MG PO TABS
25.0000 mg | ORAL_TABLET | Freq: Every day | ORAL | Status: DC
  Administered 2016-12-21 – 2016-12-22 (×2): 25 mg via ORAL
  Filled 2016-12-21 (×2): qty 1

## 2016-12-21 MED ORDER — LACTULOSE 10 GM/15ML PO SOLN
10.0000 g | ORAL | Status: DC
  Administered 2016-12-21: 10 g via ORAL
  Filled 2016-12-21: qty 30

## 2016-12-21 MED ORDER — PREGABALIN 50 MG PO CAPS
150.0000 mg | ORAL_CAPSULE | Freq: Three times a day (TID) | ORAL | Status: DC
  Administered 2016-12-21 – 2016-12-22 (×3): 150 mg via ORAL
  Filled 2016-12-21 (×3): qty 3

## 2016-12-21 MED ORDER — ALBUTEROL SULFATE (2.5 MG/3ML) 0.083% IN NEBU: 2.5000 mg | INHALATION_SOLUTION | Freq: Four times a day (QID) | RESPIRATORY_TRACT | Status: DC | PRN

## 2016-12-21 MED ORDER — ONDANSETRON HCL 4 MG/2ML IJ SOLN
INTRAMUSCULAR | Status: AC
  Filled 2016-12-21: qty 2

## 2016-12-21 MED ORDER — LOPERAMIDE HCL 2 MG PO CAPS: 4.0000 mg | ORAL_CAPSULE | ORAL | Status: DC | PRN

## 2016-12-21 NOTE — ED Triage Notes (Addendum)
Pt arrived via EMS from the [facility] with complaints of chest pain. Has had the chest pain for approximately 30 min. She rates her pain as a 9 out of 10. She complains of dizziness and SOB. Pt is tearful and stating that her neck is stiff and her left chest hurts. Pt states that the pain "comes and goes". EMS reported that she is NSR on the monitor. EMS gave her 320 ASA, zofran, and 2 Nitro sprays. Last nitro spray was at 5:24am. VS per EMS are BP 112/54 O2-98%RA. Blood Glucose was 233. Pt cell phone, clothing, and paperwork from the [facility] are bedside. EMS gave her an IV access via left AC.

## 2016-12-21 NOTE — ED Provider Notes (Signed)
This patient was signed out to me by Dr. Owens Shark. This is a 66 year old woman with multiple chronic comorbidities presenting with chest pain and shortness of breath. The patient does have negative serial troponins and an EKG without ischemic changes. She had an elevated d-dimer and underwent CT angiography, which was negative for pulmonary embolus. In the emergency department, her chest pain was slightly relieved with supplemental nitroglycerin but now is back. I will treat her with glycerin paste and plan to admit her to the hospital for further evaluation and treatment.   [name], MD 12/21/16 1028

## 2016-12-21 NOTE — ED Notes (Signed)
This RN to bedside, pt c/o dizziness and nausea. MD notified, VORB for 60m Zofran.

## 2016-12-21 NOTE — ED Notes (Signed)
Patient transported to CT 

## 2016-12-21 NOTE — Progress Notes (Signed)
Patient requested to get her home medication voltaren ordered for her right knee.  Paged Dr. Bridgett Larsson.  Awaiting callback.

## 2016-12-21 NOTE — Care Management Obs Status (Signed)
[place] NOTIFICATION   Patient Details  Name: Stephanie [name] MRN: [id number] Date of Birth: 05/29/1951   Medicare Observation Status Notification Given:  Yes Explained and patient verbalizes understanding.  Unable to place a signed copy in the chart because patient is on contact isolation   Katrina Stack, RN 12/21/2016, 4:49 PM

## 2016-12-21 NOTE — Plan of Care (Signed)
Problem: Pain Managment: Goal: General experience of comfort will improve Outcome: Progressing Complaints of right knee pain, oxycodone and volteran gel given, and one episode of chest pain at the end of shift. Morphine given. Will continue to monitor.

## 2016-12-21 NOTE — Clinical Social Work Note (Signed)
CSW received consult that patient is from a facility.  Patient is from [facility].  CSW to continue to follow patient's progress throughout discharge planning.  Jones Broom. Norval Morton, MSW, [place]  12/21/2016 12:53 PM

## 2016-12-21 NOTE — ED Notes (Signed)
Pt reports being slightly short of breath. Pt placed on 1L O2. Does not wear O2 at home, uses CPAP at night

## 2016-12-21 NOTE — ED Provider Notes (Signed)
[facility] Emergency Department Provider Note   First MD Initiated Contact with Patient 12/21/16 0533     (approximate)  I have reviewed the triage vital signs and the nursing notes.   HISTORY  Chief Complaint Chest Pain    HPI Stephanie [name] is a 66 y.o. female with below list of chronic medical conditions presents to the emergency department via EMS from the [place] with intermittent left-sided nonradiating chest pain 30 minutes. Patient states her current pain score is 8 out of 10 patient does admit to dyspnea and dizziness. Patient denies any nausea no vomiting. Patient was given aspirin 3 and 24 mg as well as 2 doses of sublingual nitroglycerin which she states improved her pain. Patient admits to lower extremity edema bilaterally   Past Medical History:  Diagnosis Date  . Adenomatous colon polyp 08/27/2014   Polyps x 3  . Anemia   . Anxiety   . 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 Arthritis   . Asthma   . Bipolar affective disorder ([place])    h/o  . Blood transfusion 2000  . Blood transfusion without reported diagnosis   . Chronic abdominal pain    abdominal wall pain  . 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 Colon polyp   . Depression   . Diabetes mellitus without complication ([place])   . Fatty liver 04/09/08   found in abd CT  . GERD (gastroesophageal reflux disease)   . H/O: CVA (cardiovascular accident)    TIA  . H/O: rheumatic fever   . Hepatitis    HEP "B" twenty years ago  . Hyperlipidemia   . Incontinence   . Insomnia   . Lower extremity edema   . Obstructive sleep apnea    Use C-PAP twice per week  . Platelets decreased ([place])   . RLS (restless legs syndrome)   . Shortness of breath   . Sleep apnea   . Stroke [facility])    TIA- 2002  . Ulcer   . Unspecified disorders of nervous system     Patient Active Problem List   Diagnosis Date Noted  . Encephalopathy 07/19/2016  . Lymphedema 06/14/2016  . Pain in limb 05/06/2016    . Swelling of limb 05/06/2016  . Postphlebitic syndrome with inflammation 05/06/2016  . Pneumonia 04/18/2016  . Chest pain 04/04/2016  . Iron deficiency anemia 01/30/2016  . Hypoglycemia 09/09/2015  . Hyperglycemia 07/16/2015  . Neuropathy 06/08/2015  . Thrombocytopenia ([place]) 08/19/2014  . Abdominal pain, chronic, epigastric 06/12/2014  . Liver cirrhosis secondary to NASH ([place]) 02/27/2014  . Dizziness and giddiness 10/30/2013  . DOE (dyspnea on exertion) 10/30/2013  . Poorly controlled type 2 diabetes mellitus with complication ([place]) 82/50/0370  . DNR (do not resuscitate) 07/29/2013  . Encounter for routine gynecological examination 07/29/2013  . Dyspnea 07/10/2013  . Lung nodule 07/10/2013  . Varicose veins of left lower extremity 10/09/2012  . GERD (gastroesophageal reflux disease) 10/09/2012  . Cirrhosis of liver ([place]) 02/02/2012  . Anxiety 07/11/2011  . Aortic dissection ([place])   . BACK PAIN, LUMBAR, CHRONIC 11/19/2009  . UNS ADVRS EFF OTH RX MEDICINAL&BIOLOGICAL SBSTNC 12/16/2008  . UNSPECIFIED VITAMIN D DEFICIENCY 09/26/2008  . TOBACCO ABUSE 06/19/2008  . RESTLESS LEGS SYNDROME 04/27/2007  . INSOMNIA 04/27/2007  . HLD (hyperlipidemia) 03/08/2007  . OSA (obstructive sleep apnea) 03/08/2007  . Essential hypertension 03/08/2007  . Asthma 03/08/2007  . BIPOLAR AFFECTIVE DISORDER, HX OF 03/08/2007  Past Surgical History:  Procedure Laterality Date  . ABDOMINAL EXPLORATION SURGERY    . ABDOMINAL HYSTERECTOMY     total  . Axillary artery cannulation via 8-mm Hemashield graft, median sternotomy, extracorporeal circulation with deep hypothermic circulatory arrest, repair of  aortic dessection  01/23/2009   Dr Arlyce Dice  . BACK SURGERY      x 5  . cataract     both eyes  . CHOLECYSTECTOMY    . COLONOSCOPY    . EYE SURGERY Bilateral    Cataract Extraction with IOL  . HEMORROIDECTOMY     and colon polyp removed  . KNEE ARTHROSCOPY WITH LATERAL RELEASE Right 11/15/2016    Procedure: KNEE ARTHROSCOPY WITH LATERAL RELEASE;  Surgeon: Corky Mull, MD;  Location: [facility] ORS;  Service: Orthopedics;  Laterality: Right;  . LIVER BIOPSY  04/10/2012   Procedure: LIVER BIOPSY;  Surgeon: Inda Castle, MD;  Location: [facility];  Service: Endoscopy;  Laterality: N/A;  ultrasound to mark order for abd limited/liver to be marked to be sent by [email or web address]   . LUMBAR FUSION  10/09  . LUMBAR WOUND DEBRIDEMENT  05/09/2011   Procedure: LUMBAR WOUND DEBRIDEMENT;  Surgeon: Dahlia Bailiff;  Location: [facility];  Service: Orthopedics;  Laterality: N/A;  IRRIGATION AND DEBRIDEMENT SPINAL WOUND  . POSTERIOR CERVICAL FUSION/FORAMINOTOMY    . ROTATOR CUFF REPAIR     left    Prior to Admission medications   Medication Sig Start Date End Date Taking? Authorizing Provider  acetaminophen (TYLENOL) 325 MG tablet Take 650 mg by mouth every 4 (four) hours as needed (for fever, minor discomfort, or headache).    Yes [provider]  albuterol (PROVENTIL HFA;VENTOLIN HFA) 108 (90 Base) MCG/ACT inhaler Inhale 2 puffs into the lungs every 6 (six) hours as needed for wheezing or shortness of breath. 07/28/16  Yes Alfred Levins, [place], MD  albuterol (PROVENTIL) (2.5 MG/3ML) 0.083% nebulizer solution Take 2.5 mg by nebulization every 6 (six) hours as needed for wheezing or shortness of breath.   Yes [provider]  alum & mag hydroxide-simeth (ANTACID LIQUID) 200-200-20 MG/5ML suspension Take 30 mLs by mouth every 6 (six) hours as needed for indigestion or heartburn. *NOTIFY MD IF SYMPTOMS PERSISTS FOR MORE THAN 3 DAYS.*   Yes [provider]  barrier cream (NON-SPECIFIED) CREA Apply 1 application topically as needed (APPLIED AFTER TOILETING TO PREVENT SKIN BREAKDOWN).   Yes [provider]  budesonide-formoterol (SYMBICORT) 160-4.5 MCG/ACT inhaler Inhale 2 puffs into the lungs 2 (two) times daily. (0800 & 2000)   Yes [provider]  buPROPion (WELLBUTRIN SR)  150 MG 12 hr tablet Take 150 mg by mouth daily. (0800)   Yes [provider]  clonazePAM (KLONOPIN) 0.5 MG tablet Take 0.25 mg by mouth 3 (three) times daily. (0800, 1300 & 2000)   Yes [provider]  diclofenac sodium (VOLTAREN) 1 % GEL Apply 2 g topically every 8 (eight) hours as needed (pain). Apply to right knee   Yes [provider]  dicyclomine (BENTYL) 10 MG capsule Take 10 mg by mouth 3 (three) times daily. (0800, 1300 & 2000)   Yes [provider]  diphenhydrAMINE (DIPHENHIST) 25 mg capsule Take 25 mg by mouth every 6 (six) hours as needed (for allergic reaction (notify provider is symptoms persist more than 24 hrs*).   Yes [provider]  Emollient (EUCERIN INTENSIVE REPAIR) LOTN Apply 1 application topically 2 (two) times daily.   Yes [provider]  ferrous  sulfate 325 (65 FE) MG tablet Take 325 mg by mouth 2 (two) times daily. (0800 & 2000)   Yes [provider]  furosemide (LASIX) 80 MG tablet Take 80 mg by mouth daily. (0800)   Yes [provider]  gabapentin (NEURONTIN) 600 MG tablet Take 600 mg by mouth 2 (two) times daily. (0800 & 2000)   Yes [provider]  guaifenesin (ROBITUSSIN) 100 MG/5ML syrup Take 300 mg by mouth every 6 (six) hours as needed for cough.    Yes [provider]  insulin glargine (LANTUS) 100 UNIT/ML injection Inject 28 Units into the skin at bedtime.   Yes [provider]  insulin lispro (HUMALOG KWIKPEN) 100 UNIT/ML KiwkPen Inject 0-9 Units into the skin 3 (three) times daily. Check FSBS before each meal & cover with SSI (sliding scale insulin)   Yes [provider]  lactulose (CHRONULAC) 10 GM/15ML solution Take 10 g by mouth 3 (three) times a week. 10/17/16  Yes [provider]  lamoTRIgine (LAMICTAL) 100 MG tablet Take 100 mg by mouth 2 (two) times daily. (0800 & 2000)   Yes [provider]  loperamide (IMODIUM) 2 MG capsule Take 4  mg by mouth as needed for diarrhea or loose stools. *Do not exceed 8 doses in 24 hours*   Yes [provider]  LORazepam (ATIVAN) 0.5 MG tablet Take 0.5 mg by mouth daily as needed for anxiety.   Yes [provider]  LYRICA 150 MG capsule Take 150 mg by mouth 3 (three) times daily. 11/29/16  Yes [provider]  magnesium hydroxide (MILK OF MAGNESIA) 400 MG/5ML suspension Take 30 mLs by mouth daily as needed (for constipation).   Yes [provider]  Menthol-Zinc Oxide (GOLD BOND EX) Apply 1 application topically daily.   Yes [provider]  metoCLOPramide (REGLAN) 5 MG tablet Take 5 mg by mouth 4 (four) times daily -  before meals and at bedtime. (0800, 1200, 1600 & 2000)   Yes [provider]  mirtazapine (REMERON) 15 MG tablet Take 45 mg by mouth at bedtime. (2000)    Yes [provider]  omeprazole (PRILOSEC) 20 MG capsule Take 20 mg by mouth daily before breakfast. (0630)   Yes [provider]  ondansetron (ZOFRAN) 4 MG tablet Take 4 mg by mouth every 4 (four) hours as needed for nausea or vomiting.   Yes [provider]  oxybutynin (DITROPAN-XL) 5 MG 24 hr tablet Take 5 mg by mouth daily. (0800)   Yes [provider]  oxyCODONE (ROXICODONE) 5 MG immediate release tablet Take 1 tablet (5 mg total) by mouth every 4 (four) hours as needed for severe pain. Patient taking differently: Take 10 mg by mouth every 4 (four) hours as needed for severe pain.  11/15/16  Yes Poggi, Marshall Cork, MD  potassium chloride SA (K-DUR,KLOR-CON) 20 MEQ tablet Take 20 mEq by mouth daily. (0800)   Yes [provider]  pyrithione zinc (HEAD AND SHOULDERS) 1 % shampoo Apply 1 application topically daily. t-gel or head and shoulders.   Yes [provider]  rOPINIRole (REQUIP) 3 MG tablet Take 3 mg by mouth at bedtime. (2000)   Yes [provider]  simvastatin (ZOCOR) 10 MG tablet Take 10 mg by mouth at bedtime.  (2000)   Yes [provider]  spironolactone (ALDACTONE) 25 MG tablet Take 25 mg by mouth daily. (0800)   Yes [provider]  traMADol (ULTRAM) 50 MG tablet Take 50  mg by mouth 2 (two) times daily. (0800 & 2000)   Yes [provider]  vitamin C (ASCORBIC ACID) 500 MG tablet Take 500 mg by mouth 2 (two) times daily. (0800 & 2000)   Yes [provider]  Water For Irrigation, Sterile ([place]) Irrigate with as directed at bedtime. Use with CPAP machine at bedtime during sleep & remove in the morning   Yes [provider]  zolpidem (AMBIEN) 5 MG tablet Take 5 mg by mouth at bedtime. (2000)   Yes [provider]    Allergies Doxycycline  Family History  Problem Relation Age of Onset  . Breast cancer Mother   . Lung cancer Mother   . Stomach cancer Father   . Diabetes Unknown        4 aunts, and 1 uncle  . Colon cancer Neg Hx   . Esophageal cancer Neg Hx   . Rectal cancer Neg Hx     Social History Social History  Substance Use Topics  . Smoking status: Former Smoker    Packs/day: 1.50    Years: 50.00    Types: Cigarettes    Quit date: 04/06/2013  . Smokeless tobacco: Never Used  . Alcohol use No    Review of Systems Constitutional: No fever/chills Eyes: No visual changes. ENT: No sore throat. Cardiovascular: Positive for chest pain. Respiratory: Positive for shortness of breath. Gastrointestinal: No abdominal pain.  No nausea, no vomiting.  No diarrhea.  No constipation. Genitourinary: Negative for dysuria. Musculoskeletal: Negative for neck pain.  Negative for back pain. Integumentary: Negative for rash. Neurological: Negative for headaches, focal weakness or numbness.Positive for dizziness  ____________________________________________   PHYSICAL EXAM:  VITAL SIGNS: ED Triage Vitals  Enc Vitals Group     BP 12/21/16 0542 118/79     Pulse Rate 12/21/16 0542 93     Resp 12/21/16 0542 13      Temp 12/21/16 0542 98 F (36.7 C)     Temp Source 12/21/16 0542 Oral     SpO2 12/21/16 0542 93 %     Weight 12/21/16 0544 102.1 kg (225 lb)     Height 12/21/16 0544 1.6 m (5' 3" )     Head Circumference --      Peak Flow --      Pain Score 12/21/16 0541 7     Pain Loc --      Pain Edu? --      Excl. in [place]? --     Constitutional: Alert and oriented. Well appearing and in no acute distress. Eyes: Conjunctivae are normal.  Head: Atraumatic. Mouth/Throat: Mucous membranes are moist.  Oropharynx non-erythematous. Neck: No stridor.  Cardiovascular: Normal rate, regular rhythm. Good peripheral circulation. Grossly normal heart sounds. Respiratory: Normal respiratory effort.  No retractions. Lungs CTAB. Gastrointestinal: Soft and nontender. No distention.  Musculoskeletal: No lower extremity tenderness nor edema. No gross deformities of extremities. Neurologic:  Normal speech and language. No gross focal neurologic deficits are appreciated.  Skin:  Skin is warm, dry and intact. No rash noted. Psychiatric: Mood and affect are normal. Speech and behavior are normal.  ____________________________________________   LABS (all labs ordered are listed, but only abnormal results are displayed)  Labs Reviewed  CBC - Abnormal; Notable for the following:       Result Value   RDW 15.8 (*)    Platelets 100 (*)    All other components within normal limits  BASIC METABOLIC PANEL  TROPONIN I  FIBRIN DERIVATIVES D-DIMER ([facility] ONLY)   ____________________________________________  EKG  ED ECG REPORT I, Poplar N Winford Hehn, the attending physician, personally viewed and interpreted this ECG.   Date: 12/21/2016  EKG Time: 5:40 AM  Rate: 92  Rhythm: Normal sinus rhythm  Axis: Normal  Intervals: Normal  ST&T Change: None   _____  Procedures   ____________________________________________   INITIAL IMPRESSION / ASSESSMENT AND PLAN / ED COURSE  Pertinent labs & imaging results that were  available during my care of the patient were reviewed by me and considered in my medical decision making (see chart for details).  66 year old female presenting with chest pain laboratory data pending EKG revealed no evidence of ischemia or infarction. Patient's care transferred to Dr. Mariea Clonts      ____________________________________________  FINAL CLINICAL IMPRESSION(S) / ED DIAGNOSES  Final diagnoses:  Chest pain, unspecified type  SOB (shortness of breath)     MEDICATIONS GIVEN DURING THIS VISIT:  Medications  morphine 2 MG/ML injection 2 mg (2 mg Intravenous Given 12/21/16 0603)  ondansetron (ZOFRAN) injection 4 mg (4 mg Intravenous Given 12/21/16 0601)  sodium chloride 0.9 % bolus 1,000 mL (1,000 mLs Intravenous New Bag/Given 12/21/16 0601)     NEW OUTPATIENT MEDICATIONS STARTED DURING THIS VISIT:  New Prescriptions   No medications on file    Modified Medications   No medications on file    Discontinued Medications   ARIPIPRAZOLE (ABILIFY) 30 MG TABLET    Take 30 mg by mouth daily.     Note:  This document was prepared using Dragon voice recognition software and may include unintentional dictation errors.    Gregor Hams, MD 12/23/16 2238

## 2016-12-21 NOTE — H&P (Signed)
[facility] NAME: Stephanie [name]    MR#:  [id number]  DATE OF BIRTH:  [birth year]  DATE OF ADMISSION:  12/21/2016  PRIMARY CARE PHYSICIAN: Sarajane Jews, MD   REQUESTING/REFERRING PHYSICIAN: [name], MD  CHIEF COMPLAINT:   Chief Complaint  Patient presents with  . Chest Pain   Chest pain since 4:30 AM today. HISTORY OF PRESENT ILLNESS:  Stephanie [name]  is a 66 y.o. female with a known history of Stroke, sleep apnea, diabetes, anemia, GERD, anxiety, asthma, chronic abdominal pain and fatty liver. The patient is started to have chest pain since 4:30 AM today. The chest pain is in substernal are, sharp, constant, 5 out of 10 with radiation to left shoulder and arm. The patient also complains of shortness breath and abdominal pain. She denies any diaphoresis, nausea or vomiting. First 2 troponin are negative. But the patient still have chest pain. Dr. Mariea Clonts start Nitro paste and requested admission for observation.   PAST MEDICAL HISTORY:   Past Medical History:  Diagnosis Date  . Adenomatous colon polyp 08/27/2014   Polyps x 3  . Anemia   . Anxiety   . 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 Arthritis   . Asthma   . Bipolar affective disorder ([place])    h/o  . Blood transfusion 2000  . Blood transfusion without reported diagnosis   . Cancer ([place])   . Chronic abdominal pain    abdominal wall pain  . 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 Colon polyp   . Depression   . Diabetes mellitus without complication ([place])   . Fatty liver 04/09/08   found in abd CT  . GERD (gastroesophageal reflux disease)   . H/O: CVA (cardiovascular accident)    TIA  . H/O: rheumatic fever   . Hepatitis    HEP "B" twenty years ago  . Hyperlipidemia   . Incontinence   . Insomnia   . Lower extremity edema   . Obstructive sleep apnea    Use C-PAP twice per week  . Platelets decreased ([place])   . RLS (restless legs syndrome)   .  Shortness of breath   . Sleep apnea   . Stroke [facility])    TIA- 2002  . Ulcer   . Unspecified disorders of nervous system     PAST SURGICAL HISTORY:   Past Surgical History:  Procedure Laterality Date  . ABDOMINAL EXPLORATION SURGERY    . ABDOMINAL HYSTERECTOMY     total  . Axillary artery cannulation via 8-mm Hemashield graft, median sternotomy, extracorporeal circulation with deep hypothermic circulatory arrest, repair of  aortic dessection  01/23/2009   Dr Arlyce Dice  . BACK SURGERY      x 5  . cataract     both eyes  . CHOLECYSTECTOMY    . COLONOSCOPY    . EYE SURGERY Bilateral    Cataract Extraction with IOL  . HEMORROIDECTOMY     and colon polyp removed  . KNEE ARTHROSCOPY WITH LATERAL RELEASE Right 11/15/2016   Procedure: KNEE ARTHROSCOPY WITH LATERAL RELEASE;  Surgeon: Corky Mull, MD;  Location: [facility] ORS;  Service: Orthopedics;  Laterality: Right;  . LIVER BIOPSY  04/10/2012   Procedure: LIVER BIOPSY;  Surgeon: Inda Castle, MD;  Location: [facility];  Service: Endoscopy;  Laterality: N/A;  ultrasound to mark order for abd limited/liver to be marked to be sent by [email or web address]   .  LUMBAR FUSION  10/09  . LUMBAR WOUND DEBRIDEMENT  05/09/2011   Procedure: LUMBAR WOUND DEBRIDEMENT;  Surgeon: Dahlia Bailiff;  Location: [facility];  Service: Orthopedics;  Laterality: N/A;  IRRIGATION AND DEBRIDEMENT SPINAL WOUND  . POSTERIOR CERVICAL FUSION/FORAMINOTOMY    . ROTATOR CUFF REPAIR     left    SOCIAL HISTORY:   Social History  Substance Use Topics  . Smoking status: Former Smoker    Packs/day: 1.50    Years: 50.00    Types: Cigarettes    Quit date: 04/06/2013  . Smokeless tobacco: Never Used  . Alcohol use No    FAMILY HISTORY:   Family History  Problem Relation Age of Onset  . Breast cancer Mother   . Lung cancer Mother   . Stomach cancer Father   . Diabetes Unknown        4 aunts, and 1 uncle  . Colon cancer Neg Hx   . Esophageal cancer Neg Hx   . Rectal cancer  Neg Hx     DRUG ALLERGIES:   Allergies  Allergen Reactions  . Doxycycline Hives, Swelling and Other (See Comments)    Reaction:  Facial swelling     REVIEW OF SYSTEMS:   Review of Systems  Constitutional: Positive for malaise/fatigue. Negative for chills and fever.  HENT: Negative for sore throat.   Eyes: Negative for blurred vision and double vision.  Respiratory: Positive for shortness of breath. Negative for cough, hemoptysis, sputum production, wheezing and stridor.   Cardiovascular: Positive for chest pain. Negative for palpitations, orthopnea and leg swelling.  Gastrointestinal: Negative for abdominal pain, blood in stool, diarrhea, melena, nausea and vomiting.  Genitourinary: Negative for dysuria and hematuria.  Musculoskeletal: Negative for back pain.  Neurological: Negative for dizziness, sensory change, speech change, focal weakness, seizures, loss of consciousness, weakness and headaches.  Psychiatric/Behavioral: Negative for depression. The patient is not nervous/anxious.     MEDICATIONS AT HOME:   Prior to Admission medications   Medication Sig Start Date End Date Taking? Authorizing Provider  acetaminophen (TYLENOL) 325 MG tablet Take 650 mg by mouth every 4 (four) hours as needed (for fever, minor discomfort, or headache).    Yes [provider]  albuterol (PROVENTIL HFA;VENTOLIN HFA) 108 (90 Base) MCG/ACT inhaler Inhale 2 puffs into the lungs every 6 (six) hours as needed for wheezing or shortness of breath. 07/28/16  Yes Alfred Levins, [place], MD  albuterol (PROVENTIL) (2.5 MG/3ML) 0.083% nebulizer solution Take 2.5 mg by nebulization every 6 (six) hours as needed for wheezing or shortness of breath.   Yes [provider]  alum & mag hydroxide-simeth (ANTACID LIQUID) 200-200-20 MG/5ML suspension Take 30 mLs by mouth every 6 (six) hours as needed for indigestion or heartburn. *NOTIFY MD IF SYMPTOMS PERSISTS FOR MORE THAN 3 DAYS.*   Yes [provider]  barrier cream (NON-SPECIFIED) CREA Apply 1 application topically as needed (APPLIED AFTER TOILETING TO PREVENT SKIN BREAKDOWN).   Yes [provider]  budesonide-formoterol (SYMBICORT) 160-4.5 MCG/ACT inhaler Inhale 2 puffs into the lungs 2 (two) times daily. (0800 & 2000)   Yes [provider]  buPROPion (WELLBUTRIN SR) 150 MG 12 hr tablet Take 150 mg by mouth daily. (0800)   Yes [provider]  clonazePAM (KLONOPIN) 0.5 MG tablet Take 0.25 mg by mouth 3 (three) times daily. (0800, 1300 & 2000)   Yes [provider]  diclofenac sodium (VOLTAREN) 1 % GEL Apply 2 g topically every 8 (eight) hours as  needed (pain). Apply to right knee   Yes [provider]  dicyclomine (BENTYL) 10 MG capsule Take 10 mg by mouth 3 (three) times daily. (0800, 1300 & 2000)   Yes [provider]  diphenhydrAMINE (DIPHENHIST) 25 mg capsule Take 25 mg by mouth every 6 (six) hours as needed (for allergic reaction (notify provider is symptoms persist more than 24 hrs*).   Yes [provider]  Emollient (EUCERIN INTENSIVE REPAIR) LOTN Apply 1 application topically 2 (two) times daily.   Yes [provider]  ferrous sulfate 325 (65 FE) MG tablet Take 325 mg by mouth 2 (two) times daily. (0800 & 2000)   Yes [provider]  furosemide (LASIX) 80 MG tablet Take 80 mg by mouth daily. (0800)   Yes [provider]  gabapentin (NEURONTIN) 600 MG tablet Take 600 mg by mouth 2 (two) times daily. (0800 & 2000)   Yes [provider]  guaifenesin (ROBITUSSIN) 100 MG/5ML syrup Take 300 mg by mouth every 6 (six) hours as needed for cough.    Yes [provider]  insulin glargine (LANTUS) 100 UNIT/ML injection Inject 28 Units into the skin at bedtime.   Yes [provider]  insulin lispro (HUMALOG KWIKPEN) 100 UNIT/ML KiwkPen Inject 0-9 Units into the skin 3 (three) times daily. Check FSBS before each meal &  cover with SSI (sliding scale insulin)   Yes [provider]  lactulose (CHRONULAC) 10 GM/15ML solution Take 10 g by mouth 3 (three) times a week. 10/17/16  Yes [provider]  lamoTRIgine (LAMICTAL) 100 MG tablet Take 100 mg by mouth 2 (two) times daily. (0800 & 2000)   Yes [provider]  loperamide (IMODIUM) 2 MG capsule Take 4 mg by mouth as needed for diarrhea or loose stools. *Do not exceed 8 doses in 24 hours*   Yes [provider]  LORazepam (ATIVAN) 0.5 MG tablet Take 0.5 mg by mouth daily as needed for anxiety.   Yes [provider]  LYRICA 150 MG capsule Take 150 mg by mouth 3 (three) times daily. 11/29/16  Yes [provider]  magnesium hydroxide (MILK OF MAGNESIA) 400 MG/5ML suspension Take 30 mLs by mouth daily as needed (for constipation).   Yes [provider]  Menthol-Zinc Oxide (GOLD BOND EX) Apply 1 application topically daily.   Yes [provider]  metoCLOPramide (REGLAN) 5 MG tablet Take 5 mg by mouth 4 (four) times daily -  before meals and at bedtime. (0800, 1200, 1600 & 2000)   Yes [provider]  mirtazapine (REMERON) 15 MG tablet Take 45 mg by mouth at bedtime. (2000)    Yes [provider]  omeprazole (PRILOSEC) 20 MG capsule Take 20 mg by mouth daily before breakfast. (0630)   Yes [provider]  ondansetron (ZOFRAN) 4 MG tablet Take 4 mg by mouth every 4 (four) hours as needed for nausea or vomiting.   Yes [provider]  oxybutynin (DITROPAN-XL) 5 MG 24 hr tablet Take 5 mg by mouth daily. (0800)   Yes [provider]  oxyCODONE (ROXICODONE) 5 MG immediate release tablet Take 1 tablet (5 mg total) by mouth every 4 (four) hours as needed for severe pain. Patient taking differently: Take 10 mg by mouth every 4 (four) hours as needed for severe pain.  11/15/16  Yes Poggi, [place], MD  potassium chloride SA (K-DUR,KLOR-CON) 20 MEQ tablet Take 20 mEq by mouth  daily. (0800)   Yes [provider]  pyrithione zinc (HEAD AND SHOULDERS) 1 % shampoo Apply 1 application topically daily. t-gel or head and shoulders.   Yes [provider]  rOPINIRole (REQUIP) 3 MG tablet Take 3 mg by mouth at bedtime. (2000)   Yes [provider]  simvastatin (ZOCOR) 10 MG tablet Take 10 mg by mouth at bedtime. (2000)   Yes [provider]  spironolactone (ALDACTONE) 25 MG tablet Take 25 mg by mouth daily. (0800)   Yes [provider]  traMADol (ULTRAM) 50 MG tablet Take 50 mg by mouth 2 (two) times daily. (0800 & 2000)   Yes [provider]  vitamin C (ASCORBIC ACID) 500 MG tablet Take 500 mg by mouth 2 (two) times daily. (0800 & 2000)   Yes [provider]  Water For Irrigation, Sterile (STERILE WATER FOR IRRIGATION) Irrigate with as directed at bedtime. Use with CPAP machine at bedtime during sleep & remove in the morning   Yes [provider]  zolpidem (AMBIEN) 5 MG tablet Take 5 mg by mouth at bedtime. (2000)   Yes [provider]      VITAL SIGNS:  Blood pressure 124/64, pulse 82, temperature 98 F (36.7 C), temperature source Oral, resp. rate 11, height 5' 3"  (1.6 m), weight 225 lb (102.1 kg), SpO2 96 %.  PHYSICAL EXAMINATION:  Physical Exam  GENERAL:  66 y.o.-year-old patient lying in the bed with no acute distress.   Obese. EYES: Pupils equal, round, reactive to light and accommodation. No scleral icterus. Extraocular muscles intact.  HEENT: Head atraumatic, normocephalic. Oropharynx and nasopharynx clear.  NECK:  Supple, no jugular venous distention. No thyroid enlargement, no tenderness.  LUNGS: Normal breath sounds bilaterally, no wheezing, rales,rhonchi or crepitation. No use of accessory muscles of respiration.  CARDIOVASCULAR: S1, S2 normal. No murmurs, rubs, or gallops.  ABDOMEN: Soft, diffuse tenderness,  no rigidity or rebound, nondistended. Bowel sounds present. No  organomegaly or mass.  EXTREMITIES: No pedal edema, cyanosis, or clubbing.  NEUROLOGIC: Cranial nerves II through XII are intact. Muscle strength 5/5 in all extremities. Sensation intact. Gait not checked.  PSYCHIATRIC: The patient is alert and oriented x 3.  SKIN: No obvious rash, lesion, or ulcer.   LABORATORY PANEL:   CBC  Recent Labs Lab 12/21/16 0540  WBC 6.7  HGB 12.8  HCT 37.5  PLT 100*   ------------------------------------------------------------------------------------------------------------------  Chemistries   Recent Labs Lab 12/21/16 0540  NA 137  K 4.3  CL 102  CO2 28  GLUCOSE 250*  BUN 20  CREATININE 0.76  CALCIUM 9.1   ------------------------------------------------------------------------------------------------------------------  Cardiac Enzymes  Recent Labs Lab 12/21/16 0810  TROPONINI <0.03   ------------------------------------------------------------------------------------------------------------------  RADIOLOGY:  Ct Angio Chest Pe W And/or Wo Contrast  Result Date: 12/21/2016 CLINICAL DATA:  Chest pain and shortness of breath EXAM: CT ANGIOGRAPHY CHEST WITH CONTRAST TECHNIQUE: Multidetector CT imaging of the chest was performed using the standard protocol during bolus administration of intravenous contrast. Multiplanar CT image reconstructions and MIPs were obtained to evaluate the vascular anatomy. CONTRAST:  75 mL Isovue 370. COMPARISON:  06/28/2016, 12/21/2016 FINDINGS: Cardiovascular: Thoracic aorta and its branches demonstrate atherosclerotic calcification without aneurysmal dilatation or dissection. No significant cardiac enlargement is noted. Coronary calcifications are seen. Pulmonary artery shows no evidence of pulmonary embolism. Mediastinum/Nodes: The thoracic inlet is within normal limits. No significant hilar or mediastinal adenopathy is identified. The esophagus as visualize is within normal limits. Lungs/Pleura: The lungs are  well-aerated without focal infiltrate or sizable effusion.  No pneumothorax is noted. Upper Abdomen: The liver is nodular and somewhat shrunken likely related to cirrhotic change. The remainder of the upper abdomen is within normal limits. Musculoskeletal: The osseous structures show postsurgical change in the cervical spine. A spinal stimulator is noted. No acute bony abnormality is seen. Review of the MIP images confirms the above findings. IMPRESSION: No evidence of pulmonary emboli. Changes consistent with cirrhosis. Aortic Atherosclerosis (ICD10-I70.0). Electronically Signed   By: [name] Catalina M.D.   On: 12/21/2016 08:42   Dg Chest Portable 1 View  Result Date: 12/21/2016 CLINICAL DATA:  66 year old female with chest pain EXAM: PORTABLE CHEST 1 VIEW COMPARISON:  Chest radiograph dated 12/06/2016 FINDINGS: Minimal bibasilar atelectatic changes. No focal consolidation, pleural effusion, or pneumothorax. The cardiac silhouette is within normal limits. Spinal stimulator wire in stable positioning. Lower cervical fixation plate and screws noted. No acute osseous pathology. IMPRESSION: 1. No acute cardiopulmonary process. 2. Stable cardiomegaly. Electronically Signed   By: Anner Crete M.D.   On: 12/21/2016 06:14      IMPRESSION AND PLAN:   Chest pain. The patient will be placed for observation. Follow-up the third troponin level, continue Nitropaste, start aspirin and Lipitor. Follow-up stress test.  DM. Continue Lantus and start sliding scale.  OSA and obesity. CPAP at night.  All the records are reviewed and case discussed with ED provider. Management plans discussed with the patient, family and they are in agreement.  CODE STATUS: Full code  TOTAL TIME TAKING CARE OF THIS PATIENT: 53 minutes.    Demetrios Loll M.D on 12/21/2016 at 11:12 AM  Between 7am to 6pm - Pager - [phone]  After 6pm go to [email or web address] - Proofreader  [facility]  Office   [phone]  CC: Primary care physician; Sarajane Jews, MD   Note: This dictation was prepared with Dragon dictation along with smaller phrase technology. Any transcriptional errors that result from this process are unintentional.

## 2016-12-21 NOTE — Progress Notes (Signed)
Stress test tomorrow. Consent has been signed and patient has been educated.

## 2016-12-22 ENCOUNTER — Inpatient Hospital Stay: Payer: Medicare Other | Admitting: Hematology and Oncology

## 2016-12-22 ENCOUNTER — Observation Stay: Payer: Medicare Other

## 2016-12-22 DIAGNOSIS — R0602 Shortness of breath: Secondary | ICD-10-CM | POA: Diagnosis not present

## 2016-12-22 LAB — NM MYOCAR MULTI W/SPECT W/WALL MOTION / EF
CHL CUP MPHR: 154 {beats}/min
CHL CUP NUCLEAR SRS: 5
CSEPHR: 62 %
Estimated workload: 1 METS
Exercise duration (min): 1 min
LV dias vol: 42 mL (ref 46–106)
LVSYSVOL: 9 mL
NUC STRESS TID: 0.85
Peak HR: 97 {beats}/min
Rest HR: 79 {beats}/min
SDS: 2
SSS: 4

## 2016-12-22 LAB — GLUCOSE, CAPILLARY
GLUCOSE-CAPILLARY: 116 mg/dL — AB (ref 65–99)
Glucose-Capillary: 136 mg/dL — ABNORMAL HIGH (ref 65–99)

## 2016-12-22 MED ORDER — TECHNETIUM TC 99M TETROFOSMIN IV KIT
12.6200 | PACK | Freq: Once | INTRAVENOUS | Status: AC | PRN
  Administered 2016-12-22: 12.62 via INTRAVENOUS

## 2016-12-22 MED ORDER — REGADENOSON 0.4 MG/5ML IV SOLN
0.4000 mg | Freq: Once | INTRAVENOUS | Status: AC
  Administered 2016-12-22: 0.4 mg via INTRAVENOUS

## 2016-12-22 MED ORDER — TECHNETIUM TC 99M TETROFOSMIN IV KIT
30.5200 | PACK | Freq: Once | INTRAVENOUS | Status: AC | PRN
  Administered 2016-12-22: 30.52 via INTRAVENOUS

## 2016-12-22 NOTE — Clinical Social Work Note (Signed)
Patient to be d/c'ed today to [facility].  Patient and family agreeable to plans will transport via [facility] transportation RN to call report to [employer] [phone].  Evette Cristal, MSW, [place]

## 2016-12-22 NOTE — Clinical Social Work Note (Addendum)
Clinical Social Work Assessment  Patient Details  Name: Stephanie [name] MRN: [id number] Date of Birth: [birth year]  Date of referral:  12/22/16               Reason for consult:  Facility Placement                Permission sought to share information with:  Family Supports, Customer service manager Permission granted to share information::  Yes, Verbal Permission Granted  Name::     Stephanie [name] [phone]  [phone]   Agency::     Relationship::     Contact Information:     Housing/Transportation Living arrangements for the past 2 months:  [place] of Information:  Patient Patient Interpreter Needed:  None Criminal Activity/Legal Involvement Pertinent to Current Situation/Hospitalization:  No - Comment as needed Significant Relationships:  Friend Lives with:  Facility Resident Do you feel safe going back to the place where you live?  Yes Need for family participation in patient care:  No (Coment)  Care giving concerns:  Patient did not express any concerns about returning back to [facility].   Social Worker assessment / plan:  Patient is a 66 year old female who is alert and oriented x4.  Patient is a resident at [facility], and has been living there for about 2.5 years.  Patient states she is happy with the care that is being provided at ALF, and is looking forward to returning.  Patient states the people are nice and helpful, patient expressed that she enjoys living at ALF.  Patient did not express any concerns about returning back to ALF.  Patient did not have any other questions or concerns.  Employment status:  Retired Forensic scientist:  Information systems manager, Medicaid In [place] PT Recommendations:  Home with [place] / Referral to community resources:     Patient/Family's Response to care:  Patient is in agreement to returning back to [facility].  Patient/Family's Understanding of and Emotional Response to Diagnosis,  Current Treatment, and Prognosis:  Patient is aware of current diagnosis and treatment plan.  Emotional Assessment Appearance:  Appears stated age Attitude/Demeanor/Rapport:    Affect (typically observed):  Pleasant, Appropriate Orientation:  Oriented to Self, Oriented to Place, Oriented to  Time, Oriented to Situation Alcohol / Substance use:  Not Applicable Psych involvement (Current and /or in the community):  No (Comment)  Discharge Needs  Concerns to be addressed:  Lack of Support Readmission within the last 30 days:  No Current discharge risk:  None Barriers to Discharge:  No Barriers Identified   Ross Ludwig, LCSWA 12/22/2016, 4:10 PM

## 2016-12-22 NOTE — Discharge Summary (Signed)
Stephanie [name], is a 66 y.o. female  DOB 1951/05/20  MRN [id number].  Admission date:  12/21/2016  Admitting Physician  Demetrios Loll, MD  Discharge Date:  12/22/2016   Primary MD  Sarajane Jews, MD  Recommendations for primary care physician for things to follow:  Follow-up with PCP in 1 week   Admission Diagnosis  SOB (shortness of breath) [R06.02] Chest pain, unspecified type [R07.9]   Discharge Diagnosis  SOB (shortness of breath) [R06.02] Chest pain, unspecified type [R07.9]    Active Problems:   Chest pain      Past Medical History:  Diagnosis Date  . Adenomatous colon polyp 08/27/2014   Polyps x 3  . Anemia   . Anxiety   . 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 Arthritis   . Asthma   . Bipolar affective disorder ([place])    h/o  . Blood transfusion 2000  . Blood transfusion without reported diagnosis   . Cancer ([place])   . Chronic abdominal pain    abdominal wall pain  . 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 Colon polyp   . Depression   . Diabetes mellitus without complication ([place])   . Fatty liver 04/09/08   found in abd CT  . GERD (gastroesophageal reflux disease)   . H/O: CVA (cardiovascular accident)    TIA  . H/O: rheumatic fever   . Hepatitis    HEP "B" twenty years ago  . Hyperlipidemia   . Incontinence   . Insomnia   . Lower extremity edema   . Obstructive sleep apnea    Use C-PAP twice per week  . Platelets decreased ([place])   . RLS (restless legs syndrome)   . Shortness of breath   . Sleep apnea   . Stroke [facility])    TIA- 2002  . Ulcer   . Unspecified disorders of nervous system     Past Surgical History:  Procedure Laterality Date  . ABDOMINAL EXPLORATION SURGERY    . ABDOMINAL HYSTERECTOMY     total  . Axillary artery cannulation via 8-mm Hemashield graft, median sternotomy,  extracorporeal circulation with deep hypothermic circulatory arrest, repair of  aortic dessection  01/23/2009   Dr Arlyce Dice  . BACK SURGERY      x 5  . cataract     both eyes  . CHOLECYSTECTOMY    . COLONOSCOPY    . EYE SURGERY Bilateral    Cataract Extraction with IOL  . HEMORROIDECTOMY     and colon polyp removed  . KNEE ARTHROSCOPY WITH LATERAL RELEASE Right 11/15/2016   Procedure: KNEE ARTHROSCOPY WITH LATERAL RELEASE;  Surgeon: Corky Mull, MD;  Location: [facility];  Service: Orthopedics;  Laterality: Right;  . LIVER BIOPSY  04/10/2012   Procedure: LIVER BIOPSY;  Surgeon: Inda Castle, MD;  Location: [facility];  Service: Endoscopy;  Laterality: N/A;  ultrasound to mark order for abd limited/liver to be marked to be sent by [email or web address]   . LUMBAR FUSION  10/09  . LUMBAR WOUND DEBRIDEMENT  05/09/2011   Procedure: LUMBAR WOUND DEBRIDEMENT;  Surgeon: Dahlia Bailiff;  Location: [facility];  Service: Orthopedics;  Laterality: N/A;  IRRIGATION AND DEBRIDEMENT SPINAL WOUND  . POSTERIOR CERVICAL FUSION/FORAMINOTOMY    . ROTATOR CUFF REPAIR     left       History of present illness and  Hospital Course:     Kindly see H&P for history of present illness  and admission details, please review complete Labs, Consult reports and Test reports for all details in brief  HPI  from the history and physical done on the day of admission  66 year old female with history of stroke, sleep apnea, diabetes, anemia, GERD, anxiety, asthma, chronic abdominal pains, fatty liver admitted for chest pain going to the left shoulder and arm. She also shortness of breath and abdominal pain.  Hospital Course  #1, substernal chest pain likely musculoskeletal:  troponins negative for 3 times. Admitted to telemetry. Patient had Lexiscan stress test. The stress test is negative patient will be discharged back to her assisted living facility.  #2 history of bipolar disorder, anxiety, depression; he is on multiple  medications including neck, Remeron, Ativan, Lyrica, , Klonopin, Wellbutrin  #3 history of COPD: No wheezing. Continue home medicines. Discharge Condition: Stable Follow UP      Discharge Instructions  and  Discharge Medications     Allergies as of 12/22/2016      Reactions   Doxycycline Hives, Swelling, Other (See Comments)   Reaction:  Facial swelling       Medication List    TAKE these medications   acetaminophen 325 MG tablet Commonly known as:  TYLENOL Take 650 mg by mouth every 4 (four) hours as needed (for fever, minor discomfort, or headache).   albuterol (2.5 MG/3ML) 0.083% nebulizer solution Commonly known as:  PROVENTIL Take 2.5 mg by nebulization every 6 (six) hours as needed for wheezing or shortness of breath.   albuterol 108 (90 Base) MCG/ACT inhaler Commonly known as:  PROVENTIL HFA;VENTOLIN HFA Inhale 2 puffs into the lungs every 6 (six) hours as needed for wheezing or shortness of breath.   ANTACID LIQUID 200-200-20 MG/5ML suspension Generic drug:  alum & mag hydroxide-simeth Take 30 mLs by mouth every 6 (six) hours as needed for indigestion or heartburn. *NOTIFY MD IF SYMPTOMS PERSISTS FOR MORE THAN 3 DAYS.*   barrier cream Crea Commonly known as:  non-specified Apply 1 application topically as needed (APPLIED AFTER TOILETING TO PREVENT SKIN BREAKDOWN).   budesonide-formoterol 160-4.5 MCG/ACT inhaler Commonly known as:  SYMBICORT Inhale 2 puffs into the lungs 2 (two) times daily. (0800 & 2000)   buPROPion 150 MG 12 hr tablet Commonly known as:  WELLBUTRIN SR Take 150 mg by mouth daily. (0800)   clonazePAM 0.5 MG tablet Commonly known as:  KLONOPIN Take 0.25 mg by mouth 3 (three) times daily. (0800, 1300 & 2000)   diclofenac sodium 1 % Gel Commonly known as:  VOLTAREN Apply 2 g topically every 8 (eight) hours as needed (pain). Apply to right knee   dicyclomine 10 MG capsule Commonly known as:  BENTYL Take 10 mg by mouth 3 (three) times  daily. (0800, 1300 & 2000)   DIPHENHIST 25 mg capsule Generic drug:  diphenhydrAMINE Take 25 mg by mouth every 6 (six) hours as needed (for allergic reaction (notify provider is symptoms persist more than 24 hrs*).   EUCERIN INTENSIVE REPAIR Lotn Apply 1 application topically 2 (two) times daily.   ferrous sulfate 325 (65 FE) MG tablet Take 325 mg by mouth 2 (two) times daily. (0800 & 2000)   furosemide 80 MG tablet Commonly known as:  LASIX Take 80 mg by mouth daily. (0800)   gabapentin 600 MG tablet Commonly known as:  NEURONTIN Take 600 mg by mouth 2 (two) times daily. (0800 & 2000)   GOLD BOND EX Apply 1 application topically daily.   guaifenesin 100 MG/5ML syrup Commonly known  as:  ROBITUSSIN Take 300 mg by mouth every 6 (six) hours as needed for cough.   HUMALOG KWIKPEN 100 UNIT/ML KiwkPen Generic drug:  insulin lispro Inject 0-9 Units into the skin 3 (three) times daily. Check FSBS before each meal & cover with SSI (sliding scale insulin)   insulin glargine 100 UNIT/ML injection Commonly known as:  LANTUS Inject 28 Units into the skin at bedtime.   lactulose 10 GM/15ML solution Commonly known as:  CHRONULAC Take 10 g by mouth 3 (three) times a week.   lamoTRIgine 100 MG tablet Commonly known as:  LAMICTAL Take 100 mg by mouth 2 (two) times daily. (0800 & 2000)   loperamide 2 MG capsule Commonly known as:  IMODIUM Take 4 mg by mouth as needed for diarrhea or loose stools. *Do not exceed 8 doses in 24 hours*   LORazepam 0.5 MG tablet Commonly known as:  ATIVAN Take 0.5 mg by mouth daily as needed for anxiety.   LYRICA 150 MG capsule Generic drug:  pregabalin Take 150 mg by mouth 3 (three) times daily.   magnesium hydroxide 400 MG/5ML suspension Commonly known as:  MILK OF MAGNESIA Take 30 mLs by mouth daily as needed (for constipation).   metoCLOPramide 5 MG tablet Commonly known as:  REGLAN Take 5 mg by mouth 4 (four) times daily -  before meals and  at bedtime. (0800, 1200, 1600 & 2000)   mirtazapine 15 MG tablet Commonly known as:  REMERON Take 45 mg by mouth at bedtime. (2000)   omeprazole 20 MG capsule Commonly known as:  PRILOSEC Take 20 mg by mouth daily before breakfast. (0630)   ondansetron 4 MG tablet Commonly known as:  ZOFRAN Take 4 mg by mouth every 4 (four) hours as needed for nausea or vomiting.   oxybutynin 5 MG 24 hr tablet Commonly known as:  DITROPAN-XL Take 5 mg by mouth daily. (0800)   oxyCODONE 5 MG immediate release tablet Commonly known as:  ROXICODONE Take 1 tablet (5 mg total) by mouth every 4 (four) hours as needed for severe pain. What changed:  how much to take   potassium chloride SA 20 MEQ tablet Commonly known as:  K-DUR,KLOR-CON Take 20 mEq by mouth daily. (0800)   pyrithione zinc 1 % shampoo Commonly known as:  HEAD AND SHOULDERS Apply 1 application topically daily. t-gel or head and shoulders.   rOPINIRole 3 MG tablet Commonly known as:  REQUIP Take 3 mg by mouth at bedtime. (2000)   simvastatin 10 MG tablet Commonly known as:  ZOCOR Take 10 mg by mouth at bedtime. (2000)   spironolactone 25 MG tablet Commonly known as:  ALDACTONE Take 25 mg by mouth daily. (0800)   sterile water for irrigation Irrigate with as directed at bedtime. Use with CPAP machine at bedtime during sleep & remove in the morning   traMADol 50 MG tablet Commonly known as:  ULTRAM Take 50 mg by mouth 2 (two) times daily. (0800 & 2000)   vitamin C 500 MG tablet Commonly known as:  ASCORBIC ACID Take 500 mg by mouth 2 (two) times daily. (0800 & 2000)   zolpidem 5 MG tablet Commonly known as:  AMBIEN Take 5 mg by mouth at bedtime. (2000)         Diet and Activity recommendation: See Discharge Instructions above   Consults obtained - None   Major procedures and Radiology Reports - PLEASE review detailed and final reports for all details, in brief -  Dg Chest 2 View  Result Date:  12/06/2016 CLINICAL DATA:  Dyspnea. EXAM: CHEST  2 VIEW COMPARISON:  Most recent radiographs 09/28/2016, chest CT 06/28/2016 FINDINGS: The cardiomediastinal contours are unchanged with stable cardiomegaly. Pulmonary vasculature is normal. No consolidation, pleural effusion, or pneumothorax. No acute osseous abnormalities are seen. Spinal stimulator remains in place. Surgical hardware in the lower cervical spine is partially included. IMPRESSION: No acute abnormality.  Stable mild cardiomegaly. Electronically Signed   By: Jeb Levering M.D.   On: 12/06/2016 00:57   Ct Angio Chest Pe W And/or Wo Contrast  Result Date: 12/21/2016 CLINICAL DATA:  Chest pain and shortness of breath EXAM: CT ANGIOGRAPHY CHEST WITH CONTRAST TECHNIQUE: Multidetector CT imaging of the chest was performed using the standard protocol during bolus administration of intravenous contrast. Multiplanar CT image reconstructions and MIPs were obtained to evaluate the vascular anatomy. CONTRAST:  75 mL Isovue 370. COMPARISON:  06/28/2016, 12/21/2016 FINDINGS: Cardiovascular: Thoracic aorta and its branches demonstrate atherosclerotic calcification without aneurysmal dilatation or dissection. No significant cardiac enlargement is noted. Coronary calcifications are seen. Pulmonary artery shows no evidence of pulmonary embolism. Mediastinum/Nodes: The thoracic inlet is within normal limits. No significant hilar or mediastinal adenopathy is identified. The esophagus as visualize is within normal limits. Lungs/Pleura: The lungs are well-aerated without focal infiltrate or sizable effusion. No pneumothorax is noted. Upper Abdomen: The liver is nodular and somewhat shrunken likely related to cirrhotic change. The remainder of the upper abdomen is within normal limits. Musculoskeletal: The osseous structures show postsurgical change in the cervical spine. A spinal stimulator is noted. No acute bony abnormality is seen. Review of the MIP images confirms  the above findings. IMPRESSION: No evidence of pulmonary emboli. Changes consistent with cirrhosis. Aortic Atherosclerosis (ICD10-I70.0). Electronically Signed   By: [name] Catalina M.D.   On: 12/21/2016 08:42   US Venous Img Lower Unilateral Right  Result Date: 11/23/2016 CLINICAL DATA:  Initial evaluation for acute right leg pain, swelling. Recent knee surgery. EXAM: Right LOWER EXTREMITY VENOUS DOPPLER ULTRASOUND TECHNIQUE: Gray-scale sonography with graded compression, as well as color Doppler and duplex ultrasound were performed to evaluate the lower extremity deep venous systems from the level of the common femoral vein and including the common femoral, femoral, profunda femoral, popliteal and calf veins including the posterior tibial, peroneal and gastrocnemius veins when visible. The superficial great saphenous vein was also interrogated. Spectral Doppler was utilized to evaluate flow at rest and with distal augmentation maneuvers in the common femoral, femoral and popliteal veins. COMPARISON:  None. FINDINGS: Contralateral Common Femoral Vein: Respiratory phasicity is normal and symmetric with the symptomatic side. No evidence of thrombus. Normal compressibility. Common Femoral Vein: No evidence of thrombus. Normal compressibility, respiratory phasicity and response to augmentation. Saphenofemoral Junction: No evidence of thrombus. Normal compressibility and flow on color Doppler imaging. Profunda Femoral Vein: No evidence of thrombus. Normal compressibility and flow on color Doppler imaging. Femoral Vein: No evidence of thrombus. Normal compressibility, respiratory phasicity and response to augmentation. Popliteal Vein: No evidence of thrombus. Normal compressibility, respiratory phasicity and response to augmentation. Calf Veins: No evidence of thrombus. Normal compressibility and flow on color Doppler imaging. Superficial Great Saphenous Vein: No evidence of thrombus. Normal compressibility and flow on  color Doppler imaging. Venous Reflux:  None. Other Findings:  None. IMPRESSION: No evidence of DVT within the right lower extremity. Electronically Signed   By: Jeannine Boga M.D.   On: 11/23/2016 05:30   Dg Chest Portable 1 View  Result  Date: 12/21/2016 CLINICAL DATA:  66 year old female with chest pain EXAM: PORTABLE CHEST 1 VIEW COMPARISON:  Chest radiograph dated 12/06/2016 FINDINGS: Minimal bibasilar atelectatic changes. No focal consolidation, pleural effusion, or pneumothorax. The cardiac silhouette is within normal limits. Spinal stimulator wire in stable positioning. Lower cervical fixation plate and screws noted. No acute osseous pathology. IMPRESSION: 1. No acute cardiopulmonary process. 2. Stable cardiomegaly. Electronically Signed   By: Anner Crete M.D.   On: 12/21/2016 06:14   Dg Knee Complete 4 Views Right  Result Date: 12/09/2016 CLINICAL DATA:  Pt c/o right knee pain since arthroscopic surgery on 11-15-16. No injury since surgery. EXAM: RIGHT KNEE - COMPLETE 4+ VIEW COMPARISON:  11/23/2016 FINDINGS: No fracture or bone lesion. Mild mediolateral joint space compartment narrowing. Small marginal osteophytes from all 3 compartments. Small joint effusion suggested. There is mild anterolateral subcutaneous soft tissue edema. IMPRESSION: 1. No fracture or bone lesion. 2. Mild arthropathic changes, most likely osteoarthritis. 3. Probable small joint effusion. Electronically Signed   By: Lajean Manes M.D.   On: 12/09/2016 13:28   Dg Knee Complete 4 Views Right  Result Date: 11/23/2016 CLINICAL DATA:  66 year old female with recent surgery presenting with right knee pain and swelling. EXAM: RIGHT KNEE - COMPLETE 4+ VIEW COMPARISON:  Right knee radiograph dated 07/14/2016 FINDINGS: There is no acute fracture or dislocation. The bones are osteopenic. There is mild osteoarthritic changes of the knee with bone spurring. Meniscal chondrocalcinosis noted. There is a moderate size  suprapatellar effusion which is increased in size compared to the prior radiograph. There is diffuse subcutaneous edema with thickening of the skin over the knee. There is overall increased swelling of the knee compared to the prior radiograph. Correlation with clinical exam is recommended to exclude an infectious process. IMPRESSION: 1. Moderate suprapatellar effusion increased compared to the prior radiograph. Clinical correlation is recommended to exclude an infectious process. 2. No acute fracture or dislocation. 3. Mild osteoarthritic changes. Electronically Signed   By: Anner Crete M.D.   On: 11/23/2016 02:35    Micro Results     Recent Results (from the past 240 hour(s))  Urine Culture     Status: Abnormal   Collection Time: 12/15/16 10:10 PM  Result Value Ref Range Status   Specimen Description URINE, RANDOM  Final   Special Requests NONE  Final   Culture (A)  Final    50,000 COLONIES/mL LACTOBACILLUS SPECIES Standardized susceptibility testing for this organism is not available. Performed at [facility], [place] [address] [place], [place] [address]    Report Status 12/17/2016 FINAL  Final  MRSA PCR Screening     Status: None   Collection Time: 12/21/16 12:16 PM  Result Value Ref Range Status   MRSA by PCR NEGATIVE NEGATIVE Final    Comment:        The GeneXpert MRSA Assay (FDA approved for NASAL specimens only), is one component of a comprehensive MRSA colonization surveillance program. It is not intended to diagnose MRSA infection nor to guide or monitor treatment for MRSA infections.        Today   Subjective:   Adaya Garmany today has no headache,no chest abdominal pain,no new weakness tingling or numbness, feels much better wants to go home today.   Objective:   Blood pressure 132/60, pulse 83, temperature (!) 97.5 F (36.4 C), temperature source Oral, resp. rate 18, height 5' 3"  (1.6 m), weight 102.1 kg (225 lb), SpO2 94 %.  No intake or  output data in the 24 hours ending 12/22/16 1240  Exam Awake Alert, Oriented x 3, No new F.N deficits, Normal affect [place].AT,PERRAL Supple Neck,No JVD, No cervical lymphadenopathy appriciated.  Symmetrical Chest wall movement, Good air movement bilaterally, CTAB RRR,No Gallops,Rubs or new Murmurs, No Parasternal Heave +ve B.Sounds, Abd Soft, Non tender, No organomegaly appriciated, No rebound -guarding or rigidity. No Cyanosis, Clubbing or edema, No new Rash or bruise  Data Review   CBC w Diff:  Lab Results  Component Value Date   WBC 6.7 12/21/2016   HGB 12.8 12/21/2016   HGB 11.7 (L) 09/29/2014   HCT 37.5 12/21/2016   HCT 34.7 (L) 09/29/2014   PLT 100 (L) 12/21/2016   PLT 102 (L) 09/29/2014   LYMPHOPCT 13 11/23/2016   LYMPHOPCT 18.9 09/29/2014   MONOPCT 7 11/23/2016   MONOPCT 8.6 09/29/2014   EOSPCT 2 11/23/2016   EOSPCT 5.0 09/29/2014   BASOPCT 1 11/23/2016   BASOPCT 1.2 09/29/2014    CMP:  Lab Results  Component Value Date   NA 137 12/21/2016   NA 142 09/15/2015   NA 138 05/03/2014   K 4.3 12/21/2016   K 3.9 05/03/2014   CL 102 12/21/2016   CL 103 05/03/2014   CO2 28 12/21/2016   CO2 28 05/03/2014   BUN 20 12/21/2016   BUN 19 09/15/2015   BUN 11 05/03/2014   CREATININE 0.76 12/21/2016   CREATININE 0.73 09/29/2014   GLU 130 09/15/2015   PROT 7.5 11/23/2016   PROT 7.0 12/26/2013   ALBUMIN 3.6 11/23/2016   ALBUMIN 2.8 (L) 12/26/2013   BILITOT 0.9 11/23/2016   BILITOT 0.6 12/26/2013   ALKPHOS 212 (H) 11/23/2016   ALKPHOS 151 (H) 12/26/2013   AST 27 11/23/2016   AST 50 (H) 12/26/2013   ALT 24 11/23/2016   ALT 41 12/26/2013  .   Total Time in preparing paper work, data evaluation and todays exam - 71 minutes  Kaiah Hosea M.D on 12/22/2016 at 12:40 PM    Note: This dictation was prepared with Dragon dictation along with smaller phrase technology. Any transcriptional errors that result from this process are unintentional.

## 2016-12-22 NOTE — NC FL2 (Signed)
[place] LEVEL OF CARE SCREENING TOOL     IDENTIFICATION  Patient Name: Stephanie [name] Birthdate: 20-Aug-1950 Sex: female Admission Date (Current Location): 12/21/2016  [place] and [place] Number:  Engineering geologist and Address:  [facility], [address], [place], [place] [address]      Provider Number: [id number]  Attending Physician Name and Address:  Epifanio Lesches, MD  Relative Name and Phone Number:  [name] [phone]  [phone]     Current Level of Care: Hospital Recommended Level of Care: [place] Prior Approval Number:    Date Approved/Denied:   PASRR Number:    Discharge Plan: Other (Comment) ([facility])    Current Diagnoses: Patient Active Problem List   Diagnosis Date Noted  . Encephalopathy 07/19/2016  . Lymphedema 06/14/2016  . Pain in limb 05/06/2016  . Swelling of limb 05/06/2016  . Postphlebitic syndrome with inflammation 05/06/2016  . Pneumonia 04/18/2016  . Chest pain 04/04/2016  . Iron deficiency anemia 01/30/2016  . Hypoglycemia 09/09/2015  . Hyperglycemia 07/16/2015  . Neuropathy 06/08/2015  . Thrombocytopenia ([place]) 08/19/2014  . Abdominal pain, chronic, epigastric 06/12/2014  . Liver cirrhosis secondary to NASH ([place]) 02/27/2014  . Dizziness and giddiness 10/30/2013  . DOE (dyspnea on exertion) 10/30/2013  . Poorly controlled type 2 diabetes mellitus with complication ([place]) 91/63/8466  . DNR (do not resuscitate) 07/29/2013  . Encounter for routine gynecological examination 07/29/2013  . Dyspnea 07/10/2013  . Lung nodule 07/10/2013  . Varicose veins of left lower extremity 10/09/2012  . GERD (gastroesophageal reflux disease) 10/09/2012  . Cirrhosis of liver ([place]) 02/02/2012  . Anxiety 07/11/2011  . Aortic dissection ([place])   . BACK PAIN, LUMBAR, CHRONIC 11/19/2009  . UNS ADVRS EFF OTH RX MEDICINAL&BIOLOGICAL SBSTNC 12/16/2008  . UNSPECIFIED VITAMIN D  DEFICIENCY 09/26/2008  . TOBACCO ABUSE 06/19/2008  . RESTLESS LEGS SYNDROME 04/27/2007  . INSOMNIA 04/27/2007  . HLD (hyperlipidemia) 03/08/2007  . OSA (obstructive sleep apnea) 03/08/2007  . Essential hypertension 03/08/2007  . Asthma 03/08/2007  . BIPOLAR AFFECTIVE DISORDER, HX OF 03/08/2007    Orientation RESPIRATION BLADDER Height & Weight     Self, Time, Situation, Place  Normal Continent Weight: 225 lb (102.1 kg) Height:  5' 3"  (160 cm)  BEHAVIORAL SYMPTOMS/MOOD NEUROLOGICAL BOWEL NUTRITION STATUS      Continent Diet (Heart Health Carb Modified)  AMBULATORY STATUS COMMUNICATION OF NEEDS Skin   Supervision Verbally Normal                       Personal Care Assistance Level of Assistance  Bathing, Feeding, Dressing Bathing Assistance: Limited assistance Feeding assistance: Independent Dressing Assistance: Limited assistance     Functional Limitations Info  Sight, Hearing, Speech Sight Info: Adequate Hearing Info: Adequate Speech Info: Adequate    SPECIAL CARE FACTORS FREQUENCY                       Contractures Contractures Info: Not present    Additional Factors Info  Code Status, Allergies Code Status Info: Full Code Allergies Info: DOXYCYCLINE           Current Medications (12/22/2016):  This is the current hospital active medication list Current Facility-Administered Medications  Medication Dose Route Frequency Provider Last Rate Last Dose  . acetaminophen (TYLENOL) tablet 650 mg  650 mg Oral Q4H PRN Demetrios Loll, MD      . albuterol (PROVENTIL) (2.5 MG/3ML) 0.083% nebulizer  solution 2.5 mg  2.5 mg Nebulization Q6H PRN Demetrios Loll, MD      . ALPRAZolam Duanne Moron) tablet 0.25 mg  0.25 mg Oral BID PRN Demetrios Loll, MD      . alum & mag hydroxide-simeth (MAALOX/MYLANTA) 200-200-20 MG/5ML suspension 30 mL  30 mL Oral Q6H PRN Demetrios Loll, MD      . aspirin EC tablet 325 mg  325 mg Oral Daily Demetrios Loll, MD   325 mg at 12/22/16 1110  . atorvastatin  (LIPITOR) tablet 40 mg  40 mg Oral q1800 Demetrios Loll, MD   40 mg at 12/21/16 1701  . buPROPion (WELLBUTRIN SR) 12 hr tablet 150 mg  150 mg Oral Daily Demetrios Loll, MD   150 mg at 12/22/16 1109  . clonazePAM (KLONOPIN) tablet 0.25 mg  0.25 mg Oral TID Demetrios Loll, MD   0.25 mg at 12/22/16 1109  . diclofenac sodium (VOLTAREN) 1 % transdermal gel 2 g  2 g Topical Q8H PRN Demetrios Loll, MD   2 g at 12/21/16 1535  . dicyclomine (BENTYL) capsule 10 mg  10 mg Oral TID Demetrios Loll, MD   10 mg at 12/22/16 1110  . diphenhydrAMINE (BENADRYL) capsule 25 mg  25 mg Oral Q6H PRN Demetrios Loll, MD      . enoxaparin (LOVENOX) injection 40 mg  40 mg Subcutaneous Q24H Demetrios Loll, MD   40 mg at 12/21/16 2207  . ferrous sulfate tablet 325 mg  325 mg Oral BID Demetrios Loll, MD   325 mg at 12/22/16 1109  . furosemide (LASIX) tablet 80 mg  80 mg Oral Daily Demetrios Loll, MD   80 mg at 12/22/16 1111  . gabapentin (NEURONTIN) tablet 600 mg  600 mg Oral BID Demetrios Loll, MD   600 mg at 12/22/16 1122  . gi cocktail (Maalox,Lidocaine,Donnatal)  30 mL Oral QID PRN Demetrios Loll, MD      . guaifenesin (ROBITUSSIN) 100 MG/5ML syrup 300 mg  300 mg Oral Q6H PRN Demetrios Loll, MD      . insulin aspart (novoLOG) injection 0-5 Units  0-5 Units Subcutaneous QHS Demetrios Loll, MD   3 Units at 12/21/16 2208  . insulin aspart (novoLOG) injection 0-9 Units  0-9 Units Subcutaneous TID WC Demetrios Loll, MD   5 Units at 12/21/16 1658  . insulin glargine (LANTUS) injection 28 Units  28 Units Subcutaneous QHS Demetrios Loll, MD   28 Units at 12/21/16 2208  . lactulose (CHRONULAC) 10 GM/15ML solution 10 g  10 g Oral Once per day on Mon Wed Fri Chen, Qing, MD   10 g at 12/21/16 1301  . lamoTRIgine (LAMICTAL) tablet 100 mg  100 mg Oral BID Demetrios Loll, MD   100 mg at 12/22/16 1122  . loperamide (IMODIUM) capsule 4 mg  4 mg Oral PRN Demetrios Loll, MD      . LORazepam (ATIVAN) tablet 0.5 mg  0.5 mg Oral Daily PRN Demetrios Loll, MD      . magnesium hydroxide (MILK OF MAGNESIA) suspension 30  mL  30 mL Oral Daily PRN Demetrios Loll, MD      . metoCLOPramide (REGLAN) tablet 5 mg  5 mg Oral TID AC & HS Demetrios Loll, MD   5 mg at 12/22/16 1122  . mirtazapine (REMERON) tablet 45 mg  45 mg Oral QHS Demetrios Loll, MD   45 mg at 12/21/16 2206  . mometasone-formoterol (DULERA) 200-5 MCG/ACT inhaler 2 puff  2 puff Inhalation BID Demetrios Loll, MD  2 puff at 12/22/16 1111  . morphine 2 MG/ML injection 2 mg  2 mg Intravenous Q2H PRN Demetrios Loll, MD   2 mg at 12/21/16 1923  . nitroGLYCERIN (NITROSTAT) SL tablet 0.4 mg  0.4 mg Sublingual Q5 min PRN [name], MD   0.4 mg at 12/21/16 0816  . ondansetron (ZOFRAN) tablet 4 mg  4 mg Oral Q4H PRN Demetrios Loll, MD      . oxybutynin (DITROPAN-XL) 24 hr tablet 5 mg  5 mg Oral Daily Demetrios Loll, MD   5 mg at 12/22/16 1109  . oxyCODONE (Oxy IR/ROXICODONE) immediate release tablet 5 mg  5 mg Oral Q4H PRN Demetrios Loll, MD   5 mg at 12/21/16 1459  . pantoprazole (PROTONIX) EC tablet 40 mg  40 mg Oral QAC breakfast Demetrios Loll, MD   40 mg at 12/22/16 1111  . potassium chloride SA (K-DUR,KLOR-CON) CR tablet 20 mEq  20 mEq Oral Daily Demetrios Loll, MD   20 mEq at 12/22/16 1111  . pregabalin (LYRICA) capsule 150 mg  150 mg Oral TID Demetrios Loll, MD   150 mg at 12/22/16 1110  . rOPINIRole (REQUIP) tablet 3 mg  3 mg Oral QHS Demetrios Loll, MD   3 mg at 12/21/16 2205  . spironolactone (ALDACTONE) tablet 25 mg  25 mg Oral Daily Demetrios Loll, MD   25 mg at 12/22/16 1109  . traMADol (ULTRAM) tablet 50 mg  50 mg Oral BID Demetrios Loll, MD   50 mg at 12/22/16 1111  . zolpidem (AMBIEN) tablet 5 mg  5 mg Oral QHS PRN Demetrios Loll, MD         Discharge Medications: Please see discharge summary for a list of discharge medications.  Relevant Imaging Results:  Relevant Lab Results:   Additional Information SSN [id number]  Ross Ludwig, [place]

## 2017-01-09 ENCOUNTER — Inpatient Hospital Stay: Payer: Medicare Other | Attending: Hematology and Oncology | Admitting: Hematology and Oncology

## 2017-01-09 ENCOUNTER — Encounter: Payer: Self-pay | Admitting: Hematology and Oncology

## 2017-01-09 VITALS — BP 105/69 | HR 90 | Temp 98.3°F | Resp 18 | Wt 230.2 lb

## 2017-01-09 DIAGNOSIS — D649 Anemia, unspecified: Secondary | ICD-10-CM | POA: Insufficient documentation

## 2017-01-09 DIAGNOSIS — Z801 Family history of malignant neoplasm of trachea, bronchus and lung: Secondary | ICD-10-CM | POA: Insufficient documentation

## 2017-01-09 DIAGNOSIS — G2581 Restless legs syndrome: Secondary | ICD-10-CM | POA: Diagnosis not present

## 2017-01-09 DIAGNOSIS — R079 Chest pain, unspecified: Secondary | ICD-10-CM

## 2017-01-09 DIAGNOSIS — F319 Bipolar disorder, unspecified: Secondary | ICD-10-CM

## 2017-01-09 DIAGNOSIS — Z86010 Personal history of colonic polyps: Secondary | ICD-10-CM | POA: Insufficient documentation

## 2017-01-09 DIAGNOSIS — E119 Type 2 diabetes mellitus without complications: Secondary | ICD-10-CM | POA: Diagnosis not present

## 2017-01-09 DIAGNOSIS — G4700 Insomnia, unspecified: Secondary | ICD-10-CM | POA: Insufficient documentation

## 2017-01-09 DIAGNOSIS — J45909 Unspecified asthma, uncomplicated: Secondary | ICD-10-CM | POA: Insufficient documentation

## 2017-01-09 DIAGNOSIS — E785 Hyperlipidemia, unspecified: Secondary | ICD-10-CM | POA: Diagnosis not present

## 2017-01-09 DIAGNOSIS — Z800 Family history of malignant neoplasm of digestive organs: Secondary | ICD-10-CM | POA: Insufficient documentation

## 2017-01-09 DIAGNOSIS — D509 Iron deficiency anemia, unspecified: Secondary | ICD-10-CM | POA: Diagnosis not present

## 2017-01-09 DIAGNOSIS — E669 Obesity, unspecified: Secondary | ICD-10-CM | POA: Diagnosis not present

## 2017-01-09 DIAGNOSIS — R161 Splenomegaly, not elsewhere classified: Secondary | ICD-10-CM | POA: Diagnosis not present

## 2017-01-09 DIAGNOSIS — D696 Thrombocytopenia, unspecified: Secondary | ICD-10-CM | POA: Insufficient documentation

## 2017-01-09 DIAGNOSIS — Z8619 Personal history of other infectious and parasitic diseases: Secondary | ICD-10-CM | POA: Diagnosis not present

## 2017-01-09 DIAGNOSIS — I7100 Dissection of unspecified site of aorta: Secondary | ICD-10-CM | POA: Diagnosis not present

## 2017-01-09 DIAGNOSIS — G4730 Sleep apnea, unspecified: Secondary | ICD-10-CM | POA: Diagnosis not present

## 2017-01-09 DIAGNOSIS — Z79899 Other long term (current) drug therapy: Secondary | ICD-10-CM | POA: Insufficient documentation

## 2017-01-09 DIAGNOSIS — F419 Anxiety disorder, unspecified: Secondary | ICD-10-CM | POA: Insufficient documentation

## 2017-01-09 DIAGNOSIS — K7581 Nonalcoholic steatohepatitis (NASH): Secondary | ICD-10-CM | POA: Insufficient documentation

## 2017-01-09 DIAGNOSIS — G8929 Other chronic pain: Secondary | ICD-10-CM | POA: Insufficient documentation

## 2017-01-09 DIAGNOSIS — Z87891 Personal history of nicotine dependence: Secondary | ICD-10-CM

## 2017-01-09 DIAGNOSIS — Z8673 Personal history of transient ischemic attack (TIA), and cerebral infarction without residual deficits: Secondary | ICD-10-CM | POA: Insufficient documentation

## 2017-01-09 DIAGNOSIS — M129 Arthropathy, unspecified: Secondary | ICD-10-CM | POA: Insufficient documentation

## 2017-01-09 DIAGNOSIS — K219 Gastro-esophageal reflux disease without esophagitis: Secondary | ICD-10-CM | POA: Insufficient documentation

## 2017-01-09 DIAGNOSIS — Z803 Family history of malignant neoplasm of breast: Secondary | ICD-10-CM

## 2017-01-09 DIAGNOSIS — K7460 Unspecified cirrhosis of liver: Secondary | ICD-10-CM | POA: Diagnosis not present

## 2017-01-09 DIAGNOSIS — Z794 Long term (current) use of insulin: Secondary | ICD-10-CM | POA: Insufficient documentation

## 2017-01-09 NOTE — Progress Notes (Signed)
[facility] day:  01/09/2017  Chief Complaint: Stephanie [name] is a 66 y.o. female with chronic thrombocytopenia and iron deficiency anemia who is seen for 3 month assessment.  HPI: The patient was last seen in the medical oncology clinic on 09/22/2016.  At that time, she was planning arthroscopic surgery on her right knee.  Hematocrit was 39.0 and platelet count was 110,000.  Ferritin had improved from 15 to 136.  Ferrous sulfate was discontinued.  Liver ultrasound on 09/29/2016 revealed a nodular liver (cirrhotic) without focal mass.  She underwent arthroscopic partial medial and lateral meniscectomies, arthroscopic abrasion chondroplasty of grade 2-3 chondromalacia of lateral tibial plateau and of grade 3-4 chondromalacia of patellofemoral compartment, and arthroscopic lateral release, right knee on 11/15/2016 by Dr. Milagros Evener.  She states her knee surgery went well.  She notes that sometimes throbs like a toothache in the morning.  She was admitted to [facility] from 12/21/2016 - 12/22/2016 with chest pain. The etiology was likely musculoskeletal.  Troponins were negative x3. She had a negative Lexiscan stress test.  Symptomatically, she is doing well.  She takes lactulose 3 times a week. She describes feeling nervous lately.  She notes bruises everywhere for the past 2 weeks.  She does not take aspirin or ibuprofen.   Past Medical History:  Diagnosis Date  . Adenomatous colon polyp 08/27/2014   Polyps x 3  . Anemia   . Anxiety   . 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 Arthritis   . Asthma   . Bipolar affective disorder ([place])    h/o  . Blood transfusion 2000  . Blood transfusion without reported diagnosis   . Cancer ([place])   . Chronic abdominal pain    abdominal wall pain  . 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 Colon polyp   . Depression   . Diabetes mellitus without complication ([place])   . Fatty liver 04/09/08   found in abd CT  . GERD  (gastroesophageal reflux disease)   . H/O: CVA (cardiovascular accident)    TIA  . H/O: rheumatic fever   . Hepatitis    HEP "B" twenty years ago  . Hyperlipidemia   . Incontinence   . Insomnia   . Lower extremity edema   . Obstructive sleep apnea    Use C-PAP twice per week  . Platelets decreased ([place])   . RLS (restless legs syndrome)   . Shortness of breath   . Sleep apnea   . Stroke [facility])    TIA- 2002  . Ulcer   . Unspecified disorders of nervous system     Past Surgical History:  Procedure Laterality Date  . ABDOMINAL EXPLORATION SURGERY    . ABDOMINAL HYSTERECTOMY     total  . Axillary artery cannulation via 8-mm Hemashield graft, median sternotomy, extracorporeal circulation with deep hypothermic circulatory arrest, repair of  aortic dessection  01/23/2009   Dr Arlyce Dice  . BACK SURGERY      x 5  . cataract     both eyes  . CHOLECYSTECTOMY    . COLONOSCOPY    . EYE SURGERY Bilateral    Cataract Extraction with IOL  . HEMORROIDECTOMY     and colon polyp removed  . KNEE ARTHROSCOPY WITH LATERAL RELEASE Right 11/15/2016   Procedure: KNEE ARTHROSCOPY WITH LATERAL RELEASE;  Surgeon: Corky Mull, MD;  Location: [facility] ORS;  Service: Orthopedics;  Laterality: Right;  .  LIVER BIOPSY  04/10/2012   Procedure: LIVER BIOPSY;  Surgeon: Inda Castle, MD;  Location: [facility];  Service: Endoscopy;  Laterality: N/A;  ultrasound to mark order for abd limited/liver to be marked to be sent by [email or web address]   . LUMBAR FUSION  10/09  . LUMBAR WOUND DEBRIDEMENT  05/09/2011   Procedure: LUMBAR WOUND DEBRIDEMENT;  Surgeon: Dahlia Bailiff;  Location: [facility] [place];  Service: Orthopedics;  Laterality: N/A;  IRRIGATION AND DEBRIDEMENT SPINAL WOUND  . POSTERIOR CERVICAL FUSION/FORAMINOTOMY    . ROTATOR CUFF REPAIR     left    Family History  Problem Relation Age of Onset  . Breast cancer Mother   . Lung cancer Mother   . Stomach cancer Father   . Diabetes Unknown        4 aunts, and 1  uncle  . Colon cancer Neg Hx   . Esophageal cancer Neg Hx   . Rectal cancer Neg Hx     Social History:  reports that she quit smoking about 3 years ago. Her smoking use included Cigarettes. She has a 75.00 pack-year smoking history. She has never used smokeless tobacco. She reports that she does not drink alcohol or use drugs.  She is a resident at the [facility].  She is alone today.  Allergies:  Allergies  Allergen Reactions  . Doxycycline Hives, Swelling and Other (See Comments)    Reaction:  Facial swelling     Current Medications: Current Outpatient Prescriptions  Medication Sig Dispense Refill  . acetaminophen (TYLENOL) 325 MG tablet Take 650 mg by mouth every 4 (four) hours as needed (for fever, minor discomfort, or headache).     Marland Kitchen albuterol (PROVENTIL HFA;VENTOLIN HFA) 108 (90 Base) MCG/ACT inhaler Inhale 2 puffs into the lungs every 6 (six) hours as needed for wheezing or shortness of breath. 1 Inhaler 2  . albuterol (PROVENTIL) (2.5 MG/3ML) 0.083% nebulizer solution Take 2.5 mg by nebulization every 6 (six) hours as needed for wheezing or shortness of breath.    . barrier cream (NON-SPECIFIED) CREA Apply 1 application topically as needed (APPLIED AFTER TOILETING TO PREVENT SKIN BREAKDOWN).    Marland Kitchen budesonide-formoterol (SYMBICORT) 160-4.5 MCG/ACT inhaler Inhale 2 puffs into the lungs 2 (two) times daily. (0800 & 2000)    . buPROPion (WELLBUTRIN SR) 150 MG 12 hr tablet Take 150 mg by mouth daily. (0800)    . clonazePAM (KLONOPIN) 0.5 MG tablet Take 0.25 mg by mouth 3 (three) times daily. (0800, 1300 & 2000)    . diclofenac sodium (VOLTAREN) 1 % GEL Apply 2 g topically every 8 (eight) hours as needed (pain). Apply to right knee    . dicyclomine (BENTYL) 10 MG capsule Take 10 mg by mouth 3 (three) times daily. (0800, 1300 & 2000)    . diphenhydrAMINE (DIPHENHIST) 25 mg capsule Take 25 mg by mouth every 6 (six) hours as needed (for allergic reaction (notify provider is symptoms  persist more than 24 hrs*).    . ferrous sulfate 325 (65 FE) MG tablet Take 325 mg by mouth 2 (two) times daily. (0800 & 2000)    . furosemide (LASIX) 80 MG tablet Take 80 mg by mouth daily. (0800)    . gabapentin (NEURONTIN) 600 MG tablet Take 600 mg by mouth 2 (two) times daily. (0800 & 2000)    . insulin glargine (LANTUS) 100 UNIT/ML injection Inject 28 Units into the skin at bedtime.    . insulin lispro (HUMALOG KWIKPEN) 100 UNIT/ML KiwkPen Inject  0-9 Units into the skin 3 (three) times daily. Check FSBS before each meal & cover with SSI (sliding scale insulin)    . lactulose (CHRONULAC) 10 GM/15ML solution Take 10 g by mouth 3 (three) times a week.    . lamoTRIgine (LAMICTAL) 100 MG tablet Take 100 mg by mouth 2 (two) times daily. (0800 & 2000)    . LORazepam (ATIVAN) 0.5 MG tablet Take 0.5 mg by mouth daily as needed for anxiety.    Marland Kitchen LYRICA 150 MG capsule Take 150 mg by mouth 3 (three) times daily.    . Menthol-Zinc Oxide (GOLD BOND EX) Apply 1 application topically daily.    . metoCLOPramide (REGLAN) 5 MG tablet Take 5 mg by mouth 4 (four) times daily -  before meals and at bedtime. (0800, 1200, 1600 & 2000)    . mirtazapine (REMERON) 15 MG tablet Take 45 mg by mouth at bedtime. (2000)     . omeprazole (PRILOSEC) 20 MG capsule Take 20 mg by mouth daily before breakfast. (0630)    . ondansetron (ZOFRAN) 4 MG tablet Take 4 mg by mouth every 4 (four) hours as needed for nausea or vomiting.    Marland Kitchen oxybutynin (DITROPAN-XL) 5 MG 24 hr tablet Take 5 mg by mouth daily. (0800)    . oxyCODONE (ROXICODONE) 5 MG immediate release tablet Take 1 tablet (5 mg total) by mouth every 4 (four) hours as needed for severe pain. (Patient taking differently: Take 10 mg by mouth every 4 (four) hours as needed for severe pain. ) 40 tablet 0  . potassium chloride SA (K-DUR,KLOR-CON) 20 MEQ tablet Take 20 mEq by mouth daily. (0800)    . rOPINIRole (REQUIP) 3 MG tablet Take 3 mg by mouth at bedtime. (2000)    .  simvastatin (ZOCOR) 10 MG tablet Take 10 mg by mouth at bedtime. (2000)    . spironolactone (ALDACTONE) 25 MG tablet Take 25 mg by mouth daily. (0800)    . traMADol (ULTRAM) 50 MG tablet Take 50 mg by mouth 2 (two) times daily. (0800 & 2000)    . vitamin C (ASCORBIC ACID) 500 MG tablet Take 500 mg by mouth 2 (two) times daily. (0800 & 2000)    . Water For Irrigation, Sterile (STERILE WATER FOR IRRIGATION) Irrigate with as directed at bedtime. Use with CPAP machine at bedtime during sleep & remove in the morning    . zolpidem (AMBIEN) 5 MG tablet Take 5 mg by mouth at bedtime. (2000)    . Emollient (EUCERIN INTENSIVE REPAIR) LOTN Apply 1 application topically 2 (two) times daily.    Marland Kitchen guaifenesin (ROBITUSSIN) 100 MG/5ML syrup Take 300 mg by mouth every 6 (six) hours as needed for cough.     . loperamide (IMODIUM) 2 MG capsule Take 4 mg by mouth as needed for diarrhea or loose stools. *Do not exceed 8 doses in 24 hours*    . magnesium hydroxide (MILK OF MAGNESIA) 400 MG/5ML suspension Take 30 mLs by mouth daily as needed (for constipation).    . pyrithione zinc (HEAD AND SHOULDERS) 1 % shampoo Apply 1 application topically daily. t-gel or head and shoulders.     No current facility-administered medications for this visit.     Review of Systems:  GENERAL:  Feels "nervous".  No fevers or sweats.  Weight up 8 pounds. PERFORMANCE STATUS (ECOG):  2 HEENT:  No visual changes, runny nose, sore throat, mouth sores or tenderness. Lungs:  Shortness of breath with exertion.  No  cough.  No hemoptysis. Cardiac:  No chest pain, palpitations, orthopnea, or PND. GI: Eating well.  No nausea, vomiting, diarrhea, constipation, or hematochezia. GU:  No urgency, frequency, dysuria, or hematuria. Musculoskeletal:  Right knee s/p surgery (see HPI).  No back pain.  No muscle tenderness. Extremities:  No pain or swelling. Skin:  No rashes or ulcers. Neuro:  No focal weakness.  No seizures.  No headache, numbness or  weakness, balance or coordination issues. Endocrine:  Diabetes.  No thyroid issues, hot flashes or night sweats. Psych:  Nervous.  No mood changes, depression. Pain:  No focal pain. Review of systems:  All other systems reviewed and found to be negative.  Physical Exam: Blood pressure 105/69, pulse 90, temperature 98.3 F (36.8 C), temperature source Tympanic, resp. rate 18, weight 230 lb 4 oz (104.4 kg). GENERAL:  Well developed, well nourished, heavyset woman sitting comfortably in the exam room in no acute distress.  MENTAL STATUS:  Alert and oriented to person, place and time. HEAD:  Short gray hair.  Normocephalic, atraumatic, face symmetric, no Cushingoid features. EYES:  Blue eyes s/p cataract surgery.  Pupils equal round and reactive to light and accomodation.  No conjunctivitis or scleral icterus. ENT:  Oropharynx clear without lesion.  Dentures.  Tongue normal. Mucous membranes dry.  RESPIRATORY:  Clear to auscultation without rales, wheezes or rhonchi. CARDIOVASCULAR:  Regular rate and rhythm without murmur, rub or gallop. ABDOMEN:  Soft, fully round, with active bowel sounds, and no appreciable hepatosplenomegaly.  No guarding or rebound tenderness.  No masses. SKIN:  Prominent abdominal wall veins.  No rashes, ulcers.  Scattered ecchymosis.  No petechiae. EXTREMITIES:  Chronic bilateral lower extremity edema.  No palpable cords. LYMPH NODES: No palpable cervical, supraclavicular, axillary or inguinal adenopathy  NEUROLOGICAL: Unremarkable. PSYCH:  Appropriate.   [facility] Labs: Labs on 09/19/2016 revealed a hematocrit 39.0, hemoglobin 12.6, MCV 94, platelets are 10,000, white count 9000 with an ANC of 7200. Comprehensive metabolic panel included an alkaline phosphatase of 213. Bilirubin, SGOT and SGPT were normal.  Creatinine was normal. Iron studies included a ferritin of 136, iron saturation of 28% and a TIBC of 310.   No visits with results within 3 Day(s) from this visit.   Latest known visit with results is:  Admission on 12/21/2016, Discharged on 12/22/2016  Component Date Value Ref Range Status  . Sodium 12/21/2016 137  135 - 145 mmol/L Final  . Potassium 12/21/2016 4.3  3.5 - 5.1 mmol/L Final  . Chloride 12/21/2016 102  101 - 111 mmol/L Final  . CO2 12/21/2016 28  22 - 32 mmol/L Final  . Glucose, Bld 12/21/2016 250* 65 - 99 mg/dL Final  . BUN 12/21/2016 20  6 - 20 mg/dL Final  . Creatinine, Ser 12/21/2016 0.76  0.44 - 1.00 mg/dL Final  . Calcium 12/21/2016 9.1  8.9 - 10.3 mg/dL Final  . GFR calc non Af Amer 12/21/2016 >60  >60 mL/min Final  . GFR calc Af Amer 12/21/2016 >60  >60 mL/min Final   Comment: (NOTE) The eGFR has been calculated using the CKD EPI equation. This calculation has not been validated in all clinical situations. eGFR's persistently <60 mL/min signify possible Chronic Kidney Disease.   . Anion gap 12/21/2016 7  5 - 15 Final  . WBC 12/21/2016 6.7  3.6 - 11.0 K/uL Final  . RBC 12/21/2016 4.07  3.80 - 5.20 MIL/uL Final  . Hemoglobin 12/21/2016 12.8  12.0 - 16.0 g/dL Final  . HCT  12/21/2016 37.5  35.0 - 47.0 % Final  . MCV 12/21/2016 92.0  80.0 - 100.0 fL Final  . MCH 12/21/2016 31.5  26.0 - 34.0 pg Final  . MCHC 12/21/2016 34.2  32.0 - 36.0 g/dL Final  . RDW 12/21/2016 15.8* 11.5 - 14.5 % Final  . Platelets 12/21/2016 100* 150 - 440 K/uL Final  . Troponin I 12/21/2016 <0.03  <0.03 ng/mL Final  . Fibrin derivatives D-dimer ([facility]) 12/21/2016 624.31* 0.00 - 499.00 Final   Comment: (NOTE) <> Exclusion of Venous Thromboembolism (VTE) - OUTPATIENT ONLY   (Emergency Department or [place])   0-499 ng/ml (FEU): With a low to intermediate pretest probability                      for VTE this test result excludes the diagnosis                      of VTE.   >499 ng/ml (FEU) : VTE not excluded; additional work up for VTE is                      required. <> Testing on Inpatients and Evaluation of Disseminated Intravascular   Coagulation  ([facility]) Reference Range:   0-499 ng/ml (FEU)   . Troponin I 12/21/2016 <0.03  <0.03 ng/mL Final  . Troponin I 12/21/2016 <0.03  <0.03 ng/mL Final  . MRSA by PCR 12/21/2016 NEGATIVE  NEGATIVE Final   Comment:        The GeneXpert MRSA Assay (FDA approved for NASAL specimens only), is one component of a comprehensive MRSA colonization surveillance program. It is not intended to diagnose MRSA infection nor to guide or monitor treatment for MRSA infections.   . SSS 12/22/2016 4   Final  . SRS 12/22/2016 5   Final  . SDS 12/22/2016 2   Final  . TID 12/22/2016 0.85   Final  . LV sys vol 12/22/2016 9  mL Final  . LV dias vol 12/22/2016 42  46 - 106 mL Final  . Rest HR 12/22/2016 79  bpm Final  . Rest BP 12/22/2016 89/55  mmHg Final  . Percent HR 12/22/2016 62  % Final  . Exercise duration (min) 12/22/2016 1  min Final  . Estimated workload 12/22/2016 1.0  METS Final  . Peak HR 12/22/2016 97  bpm Final  . Peak BP 12/22/2016 117/46  mmHg Final  . MPHR 12/22/2016 154  bpm Final  . Glucose-Capillary 12/21/2016 264* 65 - 99 mg/dL Final  . Glucose-Capillary 12/21/2016 262* 65 - 99 mg/dL Final  . Comment 1 12/21/2016 Notify RN   Final  . Comment 2 12/21/2016 Document in Chart   Final  . Glucose-Capillary 12/22/2016 136* 65 - 99 mg/dL Final  . Glucose-Capillary 12/22/2016 116* 65 - 99 mg/dL Final    Assessment:  CHANIE SOUCEK is a 66 y.o. 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 Platelets have ranged between 108,000 and 128,000 without trend. She denies any new medications or herbal products.  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  Work-up on 08/29/2014 revealed a positive rheumatoid factor and ANA (anti-double-stranded DNA of 5- equivocal).  Negative studies included hepatitis B surface antibody and antigen, hepatitis C testing, and HIV testing. B12, SPEP, and free light chains were normal.   Additional testing on 10/31/2014 revealed negative hepatitis B core antibody and CMV IgM.  Abdominal ultrasound  on 06/27/2014 revealed mild splenomegaly (14.2 cm) with moderate cirrhosis and no focal liver lesions. RUQ ultrasound on 09/29/2016 revealed a nodular liver (cirrhotic) without focal mass.   She is on lactulose 3/day.  AFP was normal (4) on 04/21/2015.  She denies any alcohol use.  She has a history of recurrent iron deficiency anemia.  Ferritin was 20 on 05/11/2015 and 15 on 11/13/2015. Hematocrit was 29.7 on 11/13/2015.  Last colonoscopy was in 08/2014.  Guaiac cards were negative x 3 in 11/2015.  Diet is good.  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  She is s/p arthroscopic right knee surgery on 11/15/2016.  Symptomatically, she notes increased bruising.  Exam reveals scattered bruises and no petechiae.  Plan: 1.  [facility] labs today: CBC with diff, CMP, ferritin, AFP, PT, PTT.   2.  Discuss cirrhosis and need for follow-up with GI. 3.  Discuss increased bruising.  Labs today to assess.  Discuss no aspirin or ibuprofen. 4.  Consult GI (Dr Allen Norris): cirrhosis. 5.  Limited abdominal ultrasound 03/31/2017. 6.  RTC in 3 months for MD assessment, and review of [facility] labs.   Lequita Asal, MD  01/09/2017, 2:00 PM

## 2017-01-09 NOTE — Progress Notes (Signed)
Patient had right knee arthroscopy June 14th.  Doing well.  Patient offers no complaints today.

## 2017-01-11 IMAGING — MR MR KNEE*R* W/O CM
7 series · 39 of 40 positions shown · non-contrast
Comparison: Plain films right knee 08/06/2012 and 03/09/2016.

CLINICAL DATA: Right knee pain and swelling for 4-5 months. No
known injury.

EXAM:
MRI OF THE RIGHT KNEE WITHOUT CONTRAST
TECHNIQUE: Multiplanar, multisequence MR imaging of the knee was performed. No
intravenous contrast was administered.

[Series 3: PD fat-sat · axial · 3.0mm · 0.50mm/px · z∈[-59,+53]mm · 7 of 35 slices shown (1 of 4)]
[im 1/35]
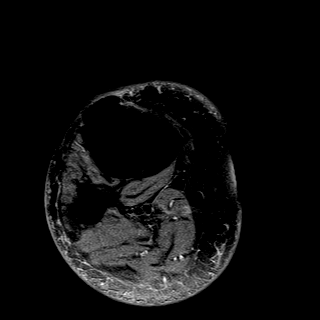
[im 6/35]
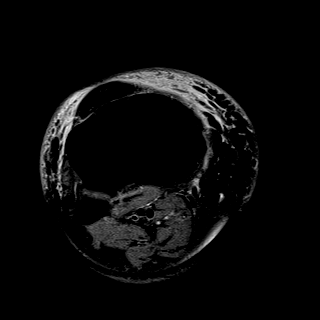
[im 12/35]
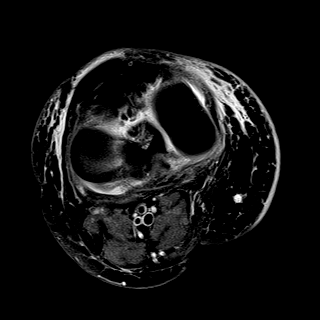
[im 18/35]
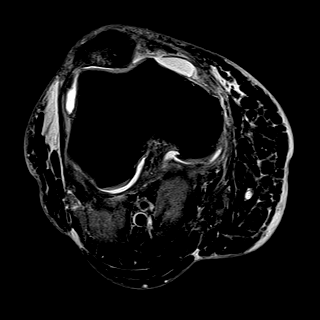
[im 23/35]
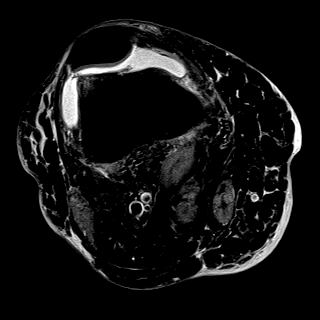
[im 29/35]
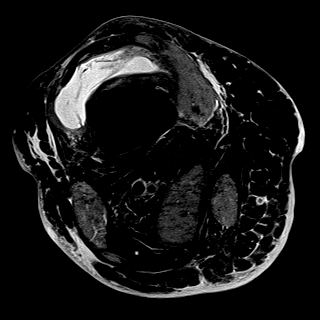
[im 35/35]
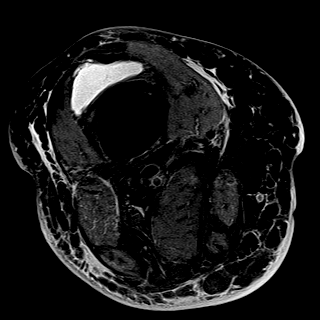

[Series 4: T1 · coronal · 3.0mm · 0.56mm/px · 6 of 33 slices shown]
[im 1/33]
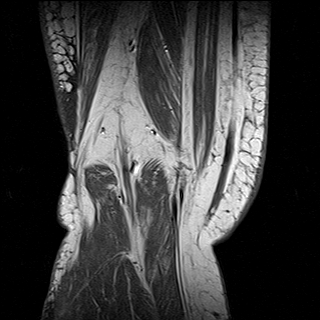
[im 7/33]
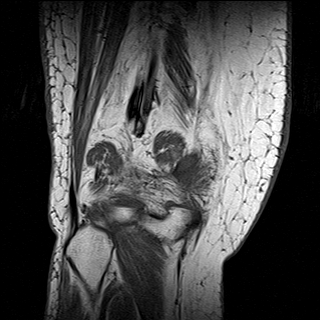
[im 13/33]
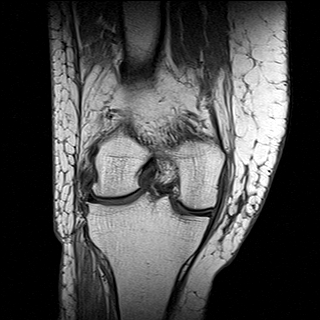
[im 20/33]
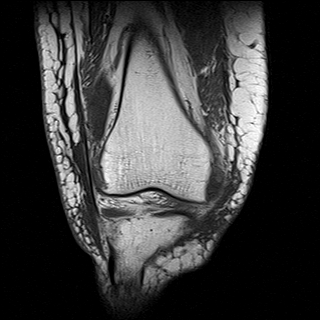
[im 26/33]
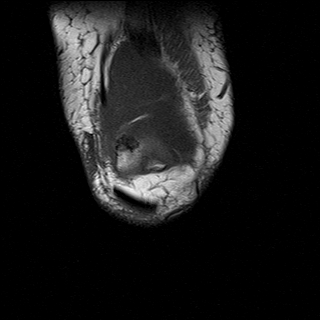
[im 33/33]
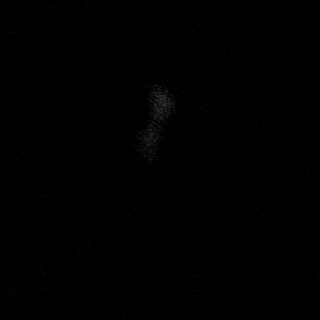

[Series 5: T2 fat-sat · coronal · 3.0mm · 0.35mm/px · 6 of 33 slices shown]
[im 1/33]
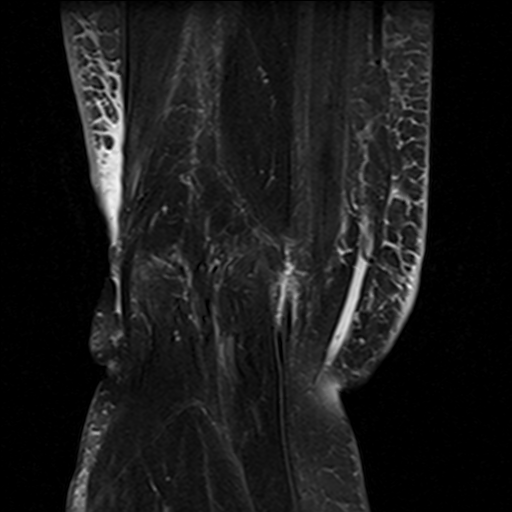
[im 7/33]
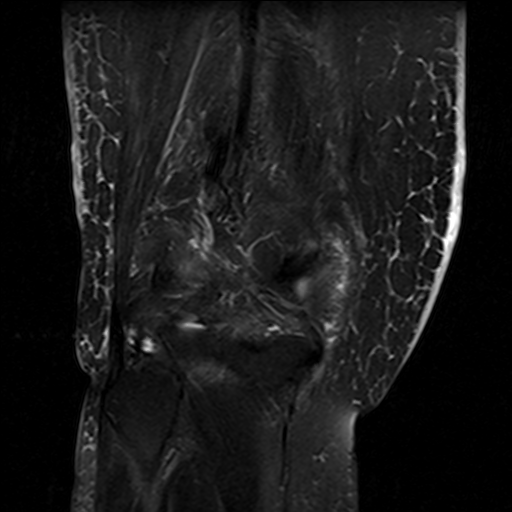
[im 13/33]
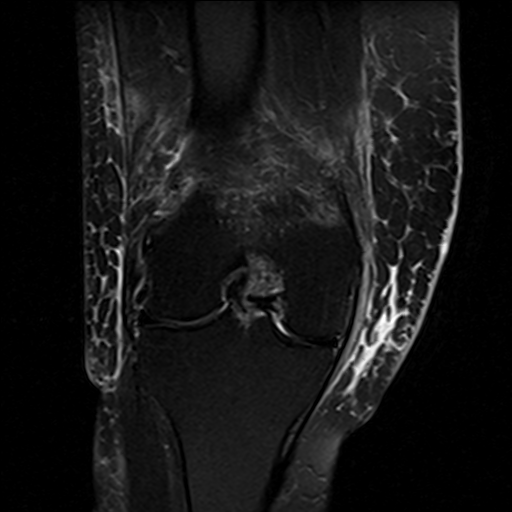
[im 20/33]
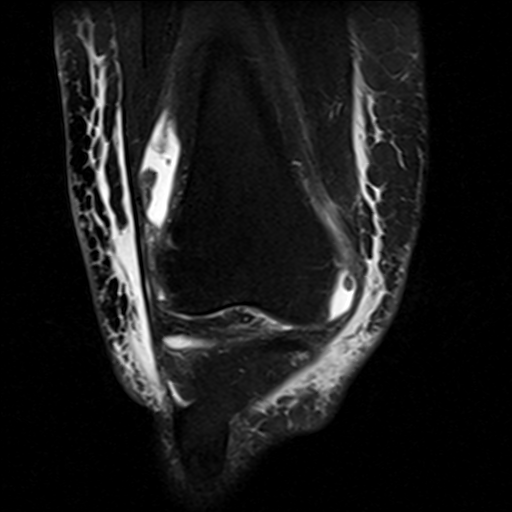
[im 26/33]
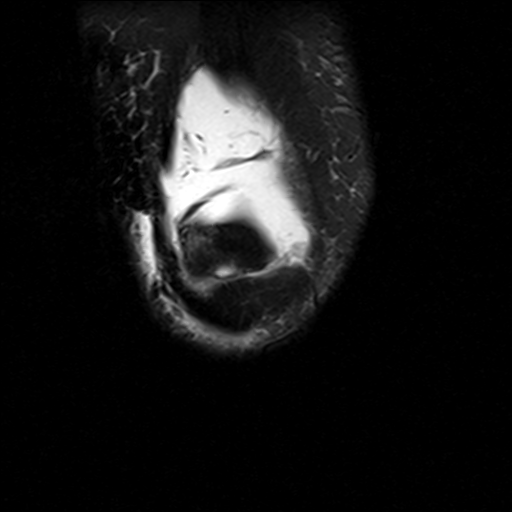
[im 33/33]
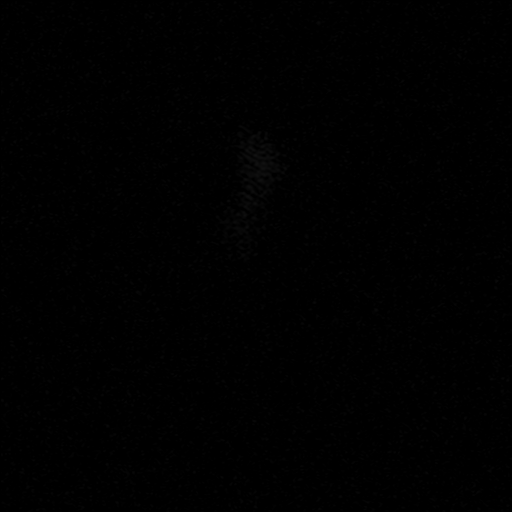

[Series 6: PD fat-sat · sagittal · 3.0mm · 0.50mm/px · 6 of 35 slices shown (2 of 4)]
[im 1/35]
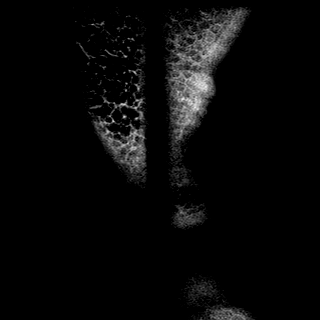
[im 7/35]
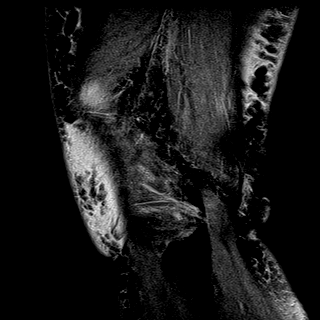
[im 14/35]
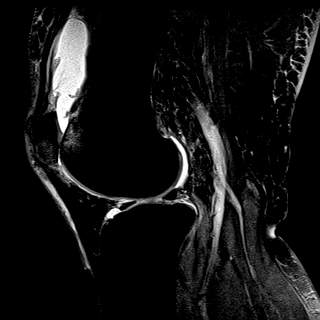
[im 21/35]
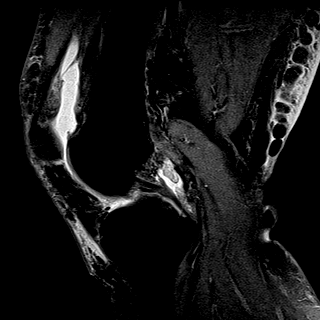
[im 28/35]
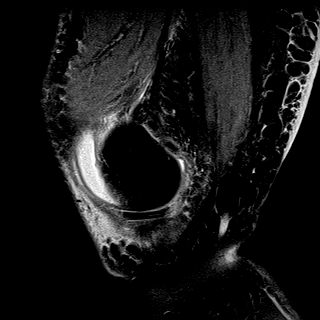
[im 35/35]
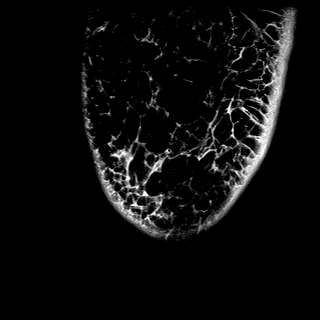

[Series 7: PD fat-sat · coronal · 3.0mm · 0.56mm/px · 6 of 33 slices shown (3 of 4)]
[im 1/33]
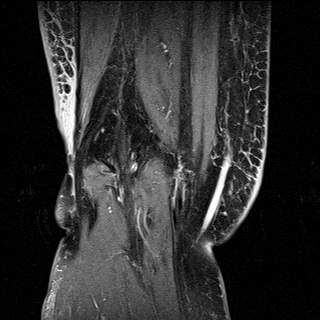
[im 7/33]
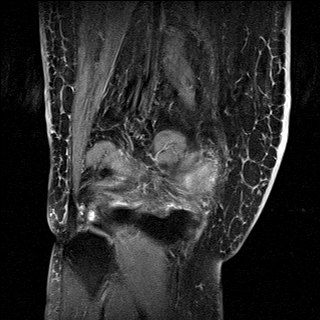
[im 13/33]
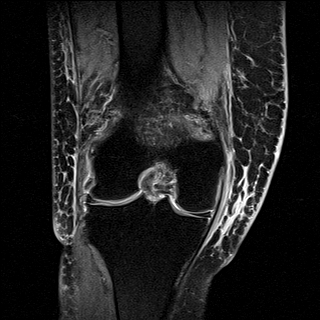
[im 20/33]
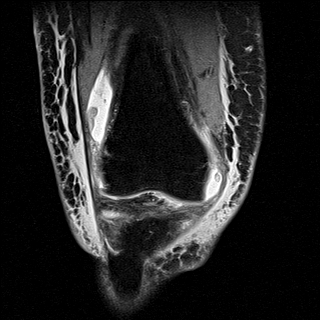
[im 26/33]
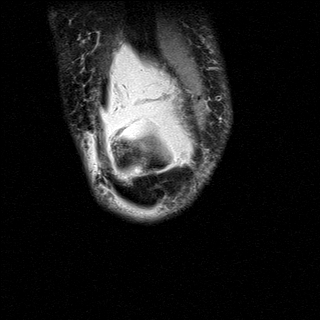
[im 33/33]
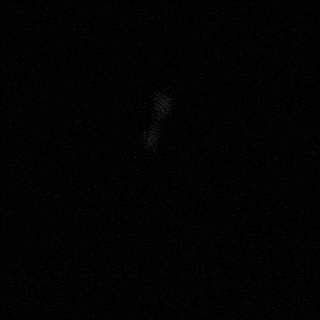

[Series 8: STIR · sagittal · 3.0mm · 0.62mm/px · 5 of 35 slices shown]
[im 1/35]
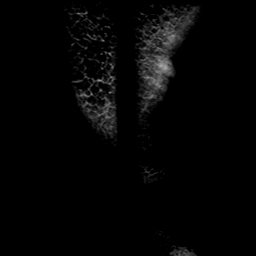
[im 7/35]
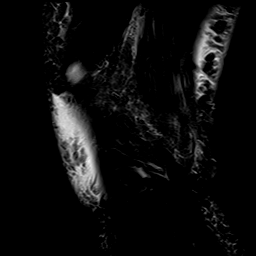
[im 14/35]
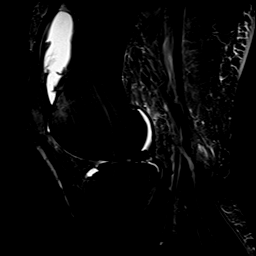
[im 21/35]
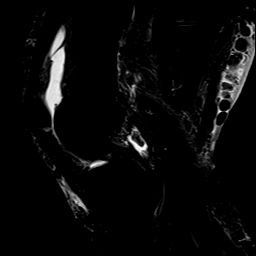
[im 28/35]
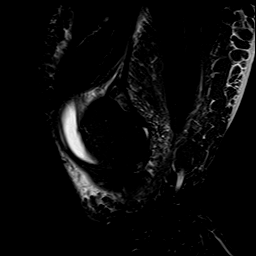

[Series 9: PD fat-sat · coronal · 2.0mm · 0.62mm/px · 3 of 17 slices shown (4 of 4)]
[im 1/17]
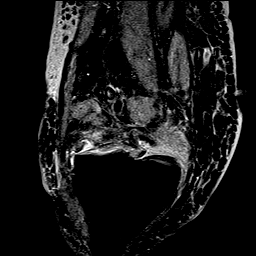
[im 9/17]
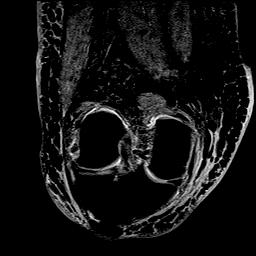
[im 17/17]
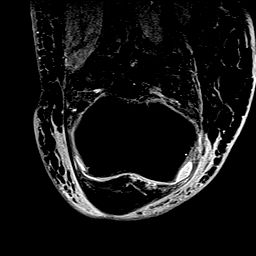

[39 of 40 positions shown; findings below may reference images not displayed]

FINDINGS: MENISCI

Medial meniscus: Degenerative signal is seen in the posterior horn.
No tear.

Lateral meniscus:  Intact.

LIGAMENTS

Cruciates:  Intact.

Collaterals:  Intact.

CARTILAGE

Patellofemoral: Extensive cartilage loss is present along the
lateral femoral trochlea and lateral patellar facet with associated
marrow edema. Small subchondral cysts in the lateral patellar facet
also noted.

Medial:  Minimally degenerated.

Lateral:  Negative.

Joint: Small to moderate joint effusion is identified. Loose hyaline
cartilage fragment along the medial aspect of the intercondylar
notch of the femur measures 0.9 cm in diameter. A 0.5 cm in diameter
loose cartilage fragment is seen along the medial femoral condyle.

Popliteal Fossa:  No Baker's cyst.

Extensor Mechanism:  Intact.

Bones: No fracture or worrisome lesion. Small lateral compartment
osteophytes noted.

Other: None.
IMPRESSION: Negative for meniscal or ligament tear.

Advanced patellofemoral osteoarthritis along the lateral patellar
facet and lateral femoral trochlea with associated marrow edema.

Two loose cartilage fragments are identified in the joint. One is
along the medial femoral condyle and a second is seen along the
medial side of the intercondylar notch of the femur.

## 2017-01-12 ENCOUNTER — Ambulatory Visit: Payer: Medicare Other | Admitting: Pulmonary Disease

## 2017-01-19 IMAGING — CR DG HIP (WITH OR WITHOUT PELVIS) 2-3V*R*
3 series · 3 of 3 positions shown · non-contrast
Comparison: No priors.

CLINICAL DATA: 65-year-old female with history of trauma from a
fall onto the right side today complaining of pain in the right hip
and right knee.

EXAM:
DG HIP (WITH OR WITHOUT PELVIS) 2-3V RIGHT

[pelvis ap]
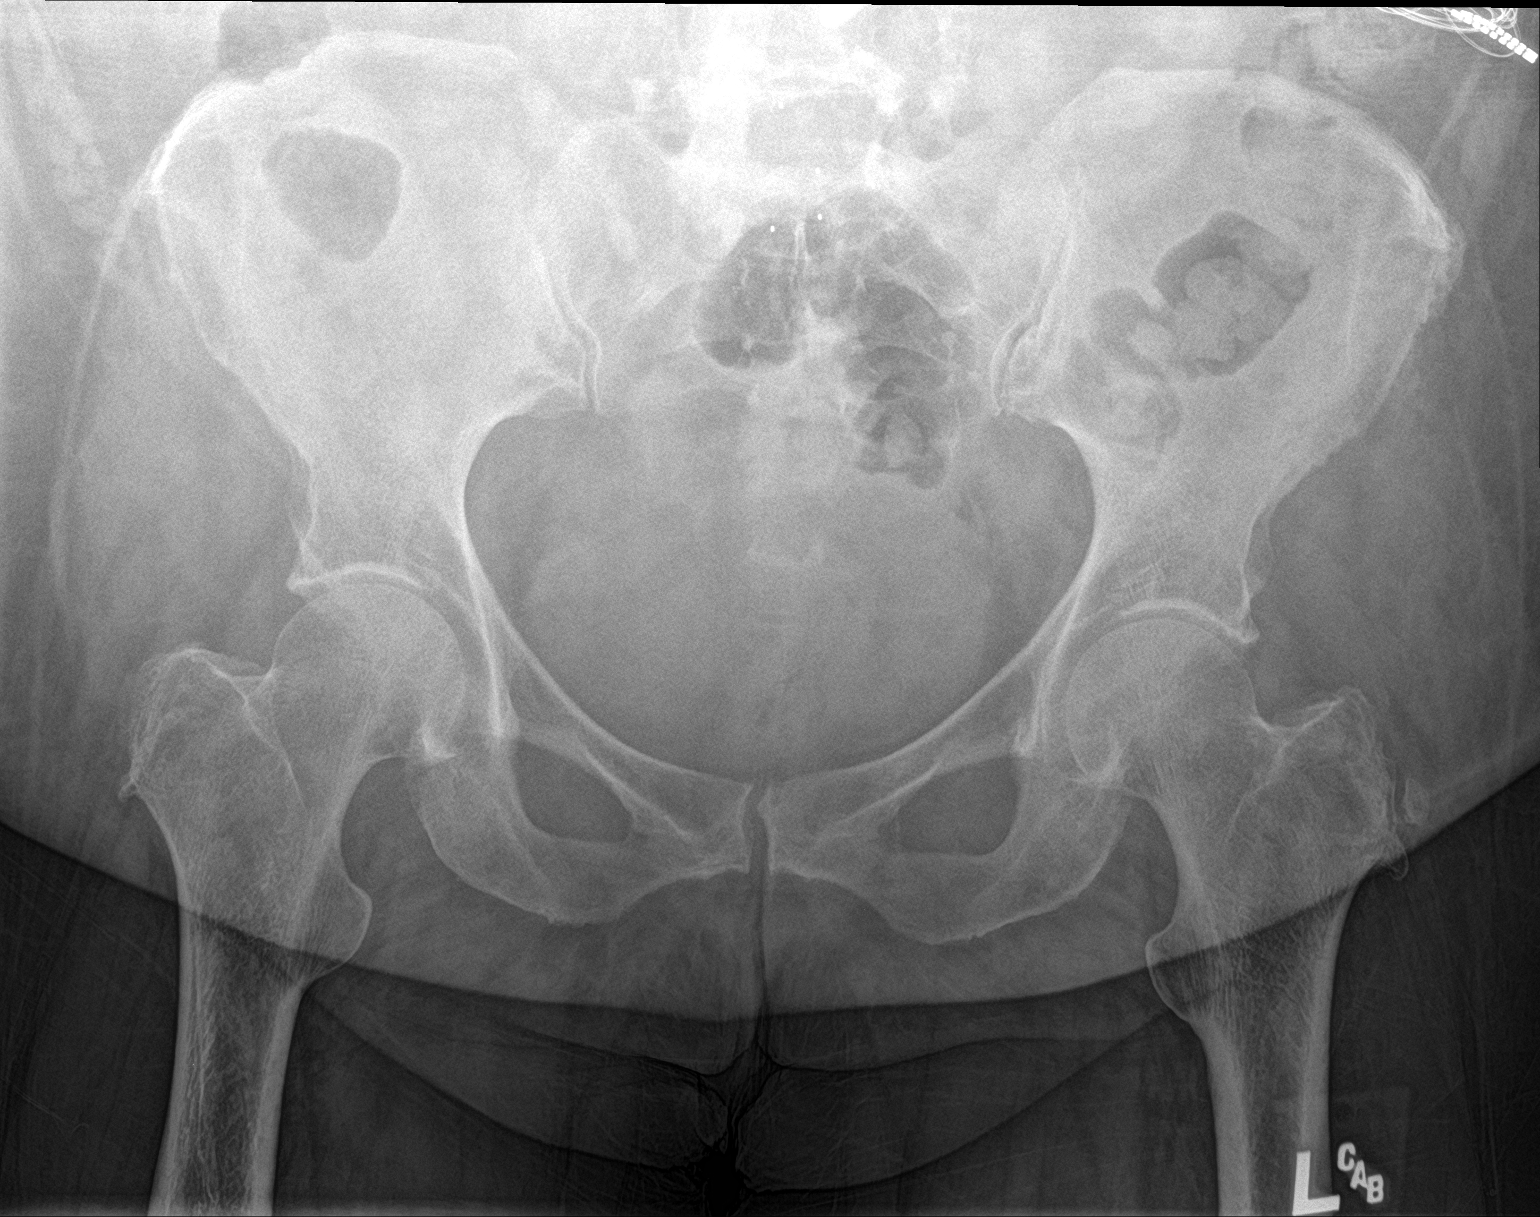

[hip ap]
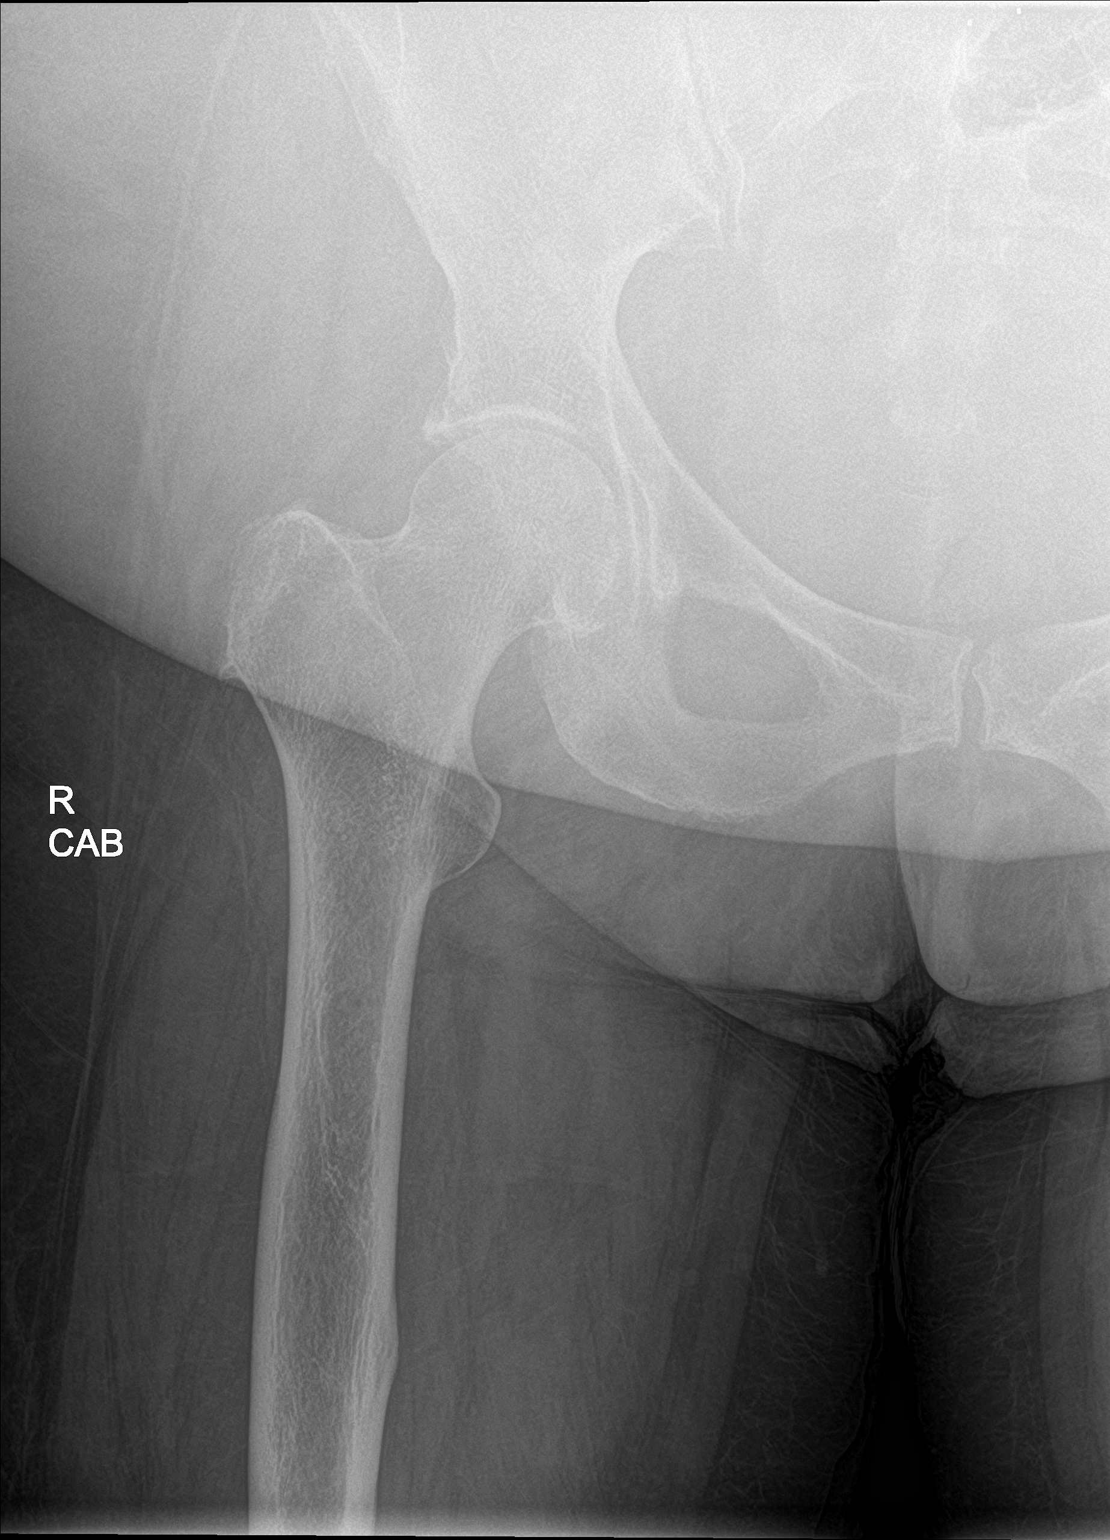

[hip lat]
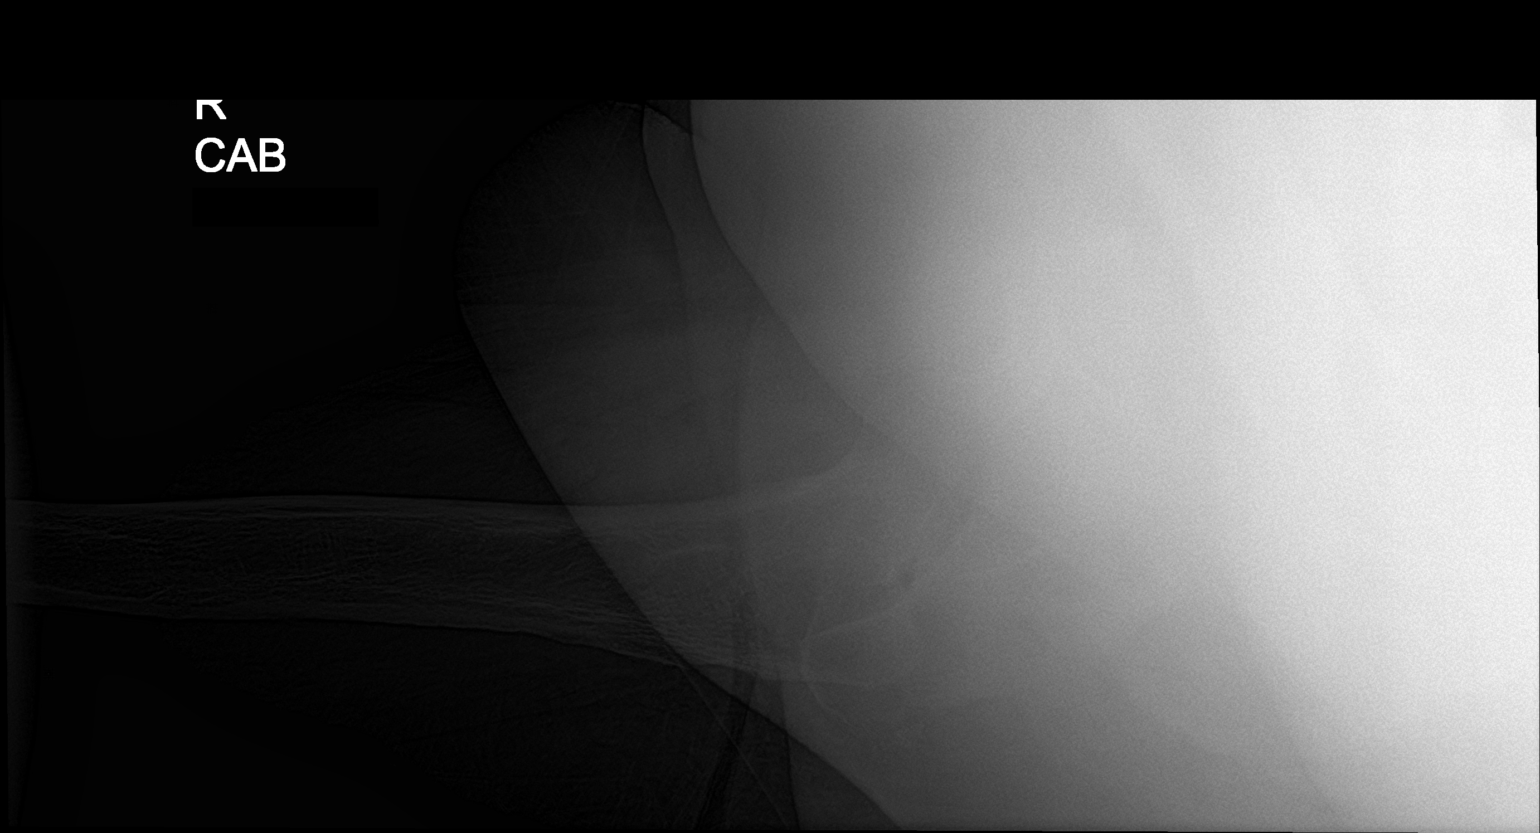

[3 of 3 positions shown; findings below may reference images not displayed]

FINDINGS: There is no evidence of hip fracture or dislocation. Mild bilateral
hip joint osteoarthritis.
IMPRESSION: 1. No acute radiographic abnormality of the bony pelvis or the right
hip.

## 2017-01-19 IMAGING — CT CT HIP*R* W/O CM
2 of 3 series · 17 of 46 positions shown, 19 images · non-contrast
Comparison: None.

CLINICAL DATA: Status post fall, right hip pain

EXAM:
CT OF THE RIGHT HIP WITHOUT CONTRAST
TECHNIQUE: Multidetector CT imaging of the right hip was performed according to
the standard protocol. Multiplanar CT image reconstructions were
also generated.

[Series 3: axial st · axial · 0.50mm/px · z∈[-1012,-838]mm · 14 of 101 slices shown, 16 images]
[im 7/101  soft-tissue]
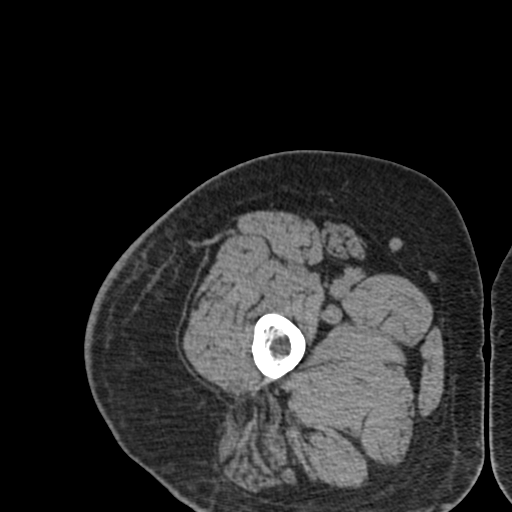
[im 7/101  bone]
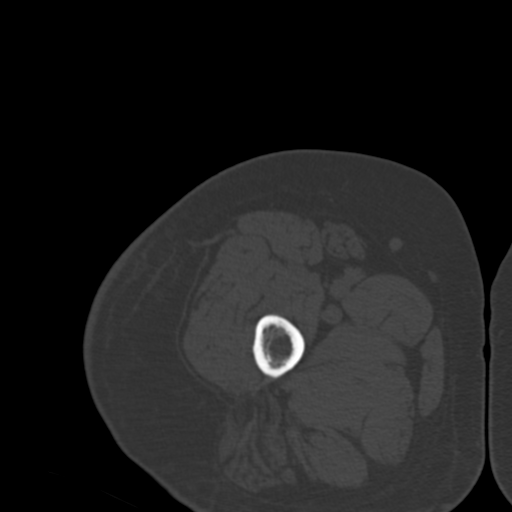
[im 13/101  soft-tissue]
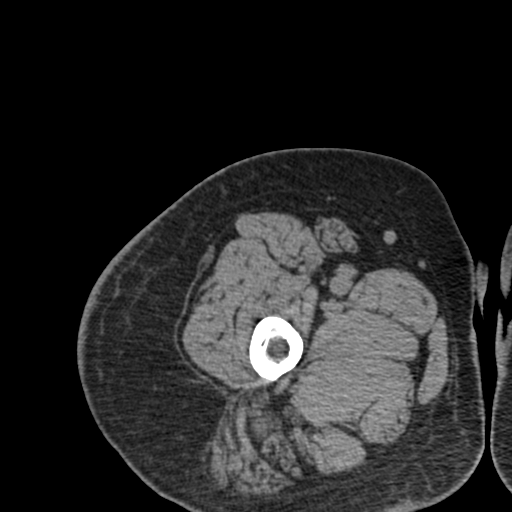
[im 20/101  soft-tissue]
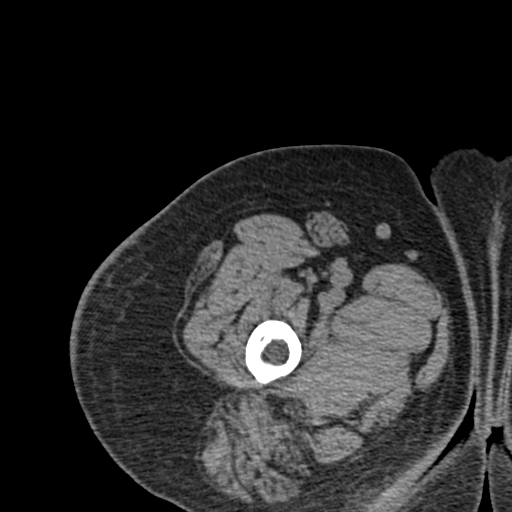
[im 26/101  soft-tissue]
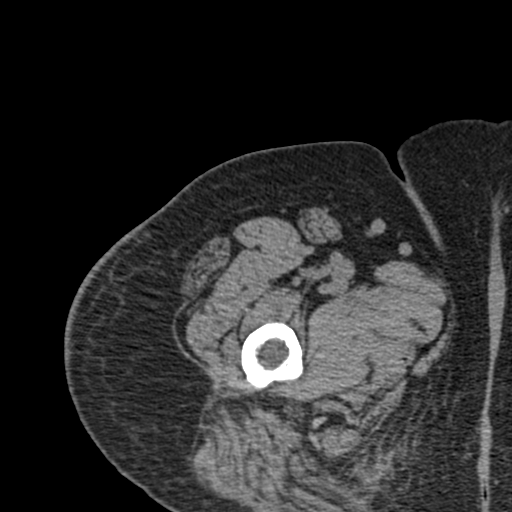
[im 33/101  soft-tissue]
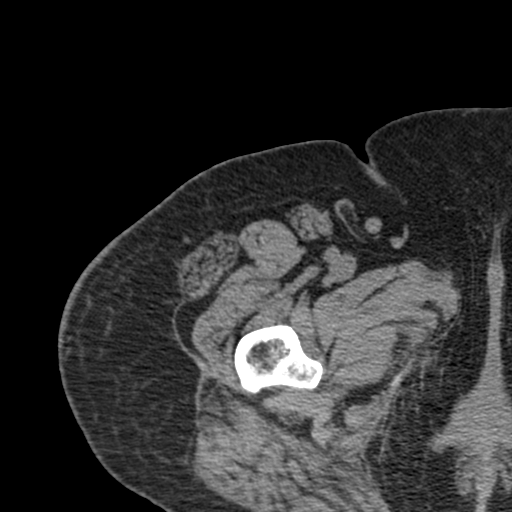
[im 39/101  soft-tissue]
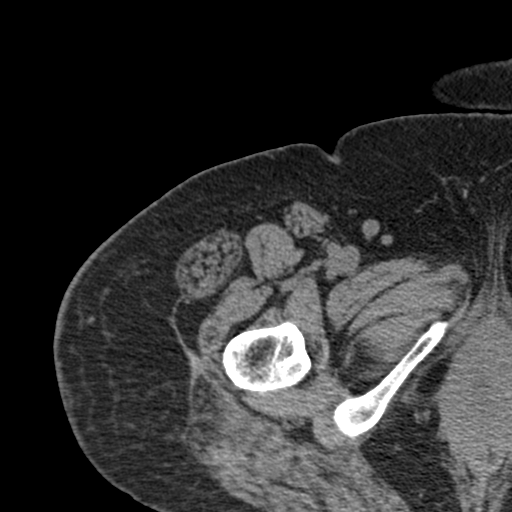
[im 46/101  soft-tissue]
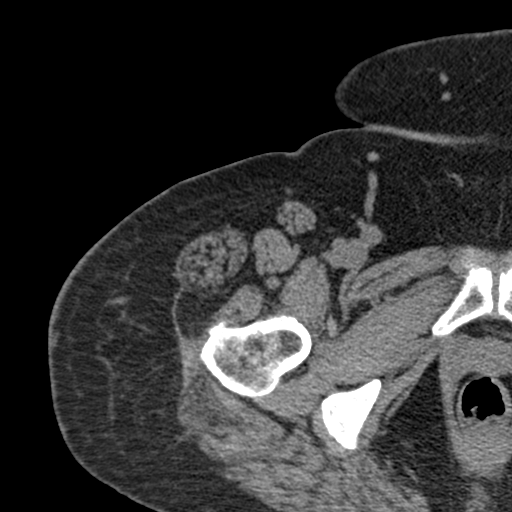
[im 55/101  soft-tissue]
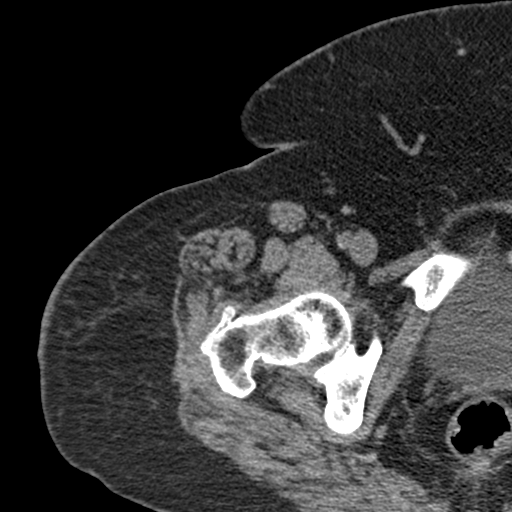
[im 62/101  soft-tissue]
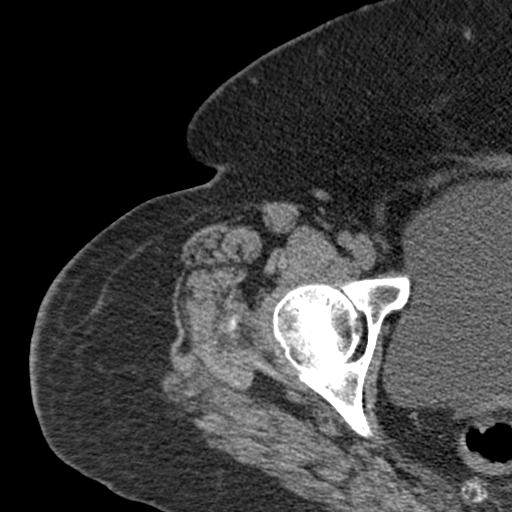
[im 62/101  bone]
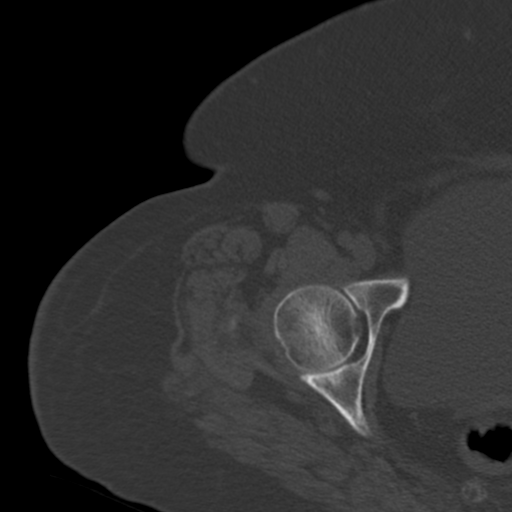
[im 68/101  soft-tissue]
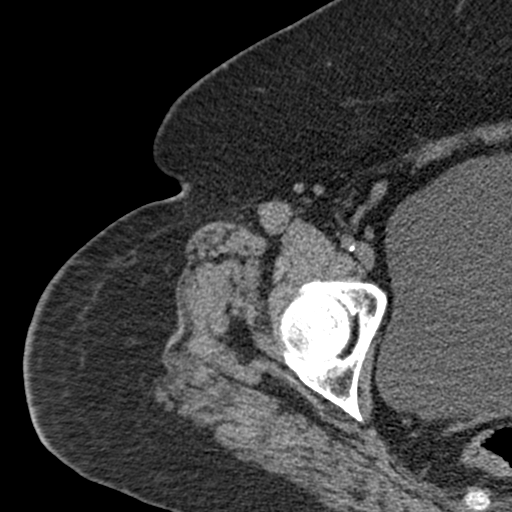
[im 75/101  soft-tissue]
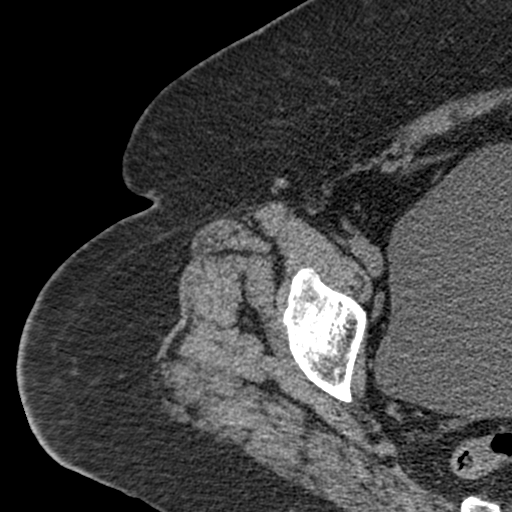
[im 81/101  soft-tissue]
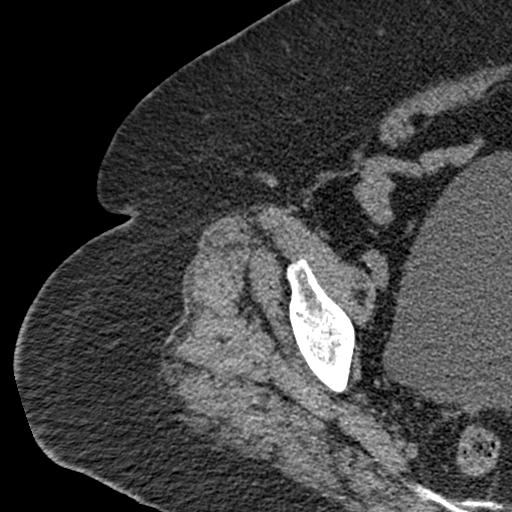
[im 88/101  soft-tissue]
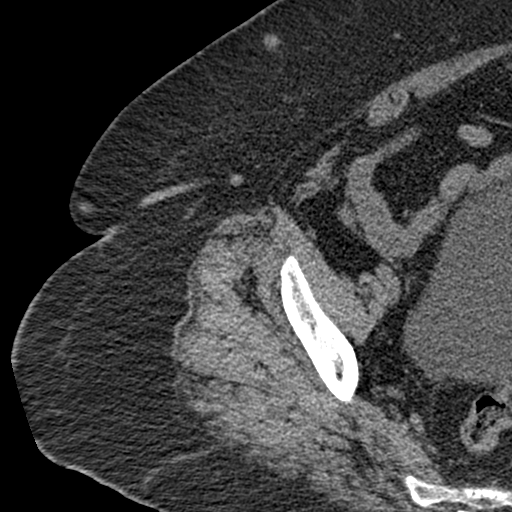
[im 94/101  soft-tissue]
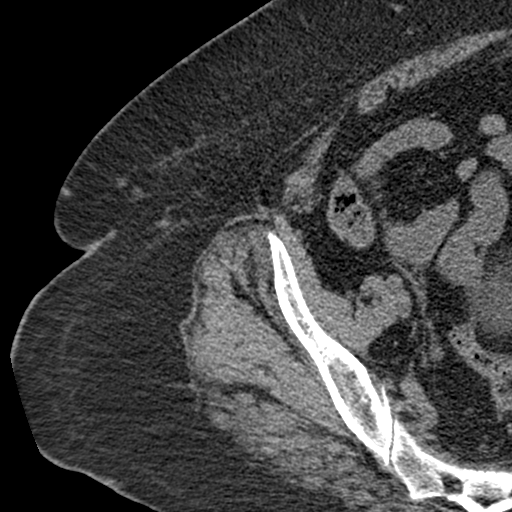

[Series 8: cor cor st · coronal · 0.39mm/px · 3 of 97 slices shown]
[im 33/97  soft-tissue]
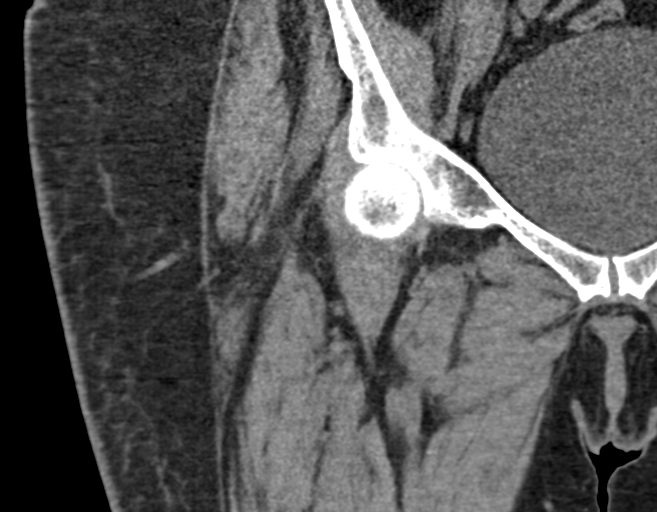
[im 43/97  soft-tissue]
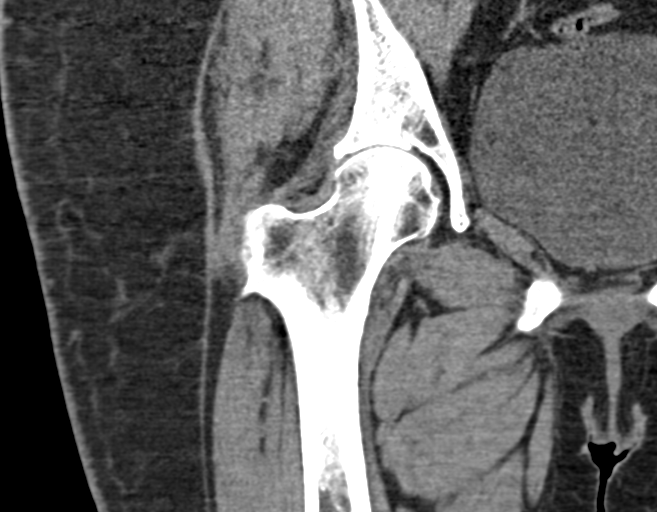
[im 54/97  soft-tissue]
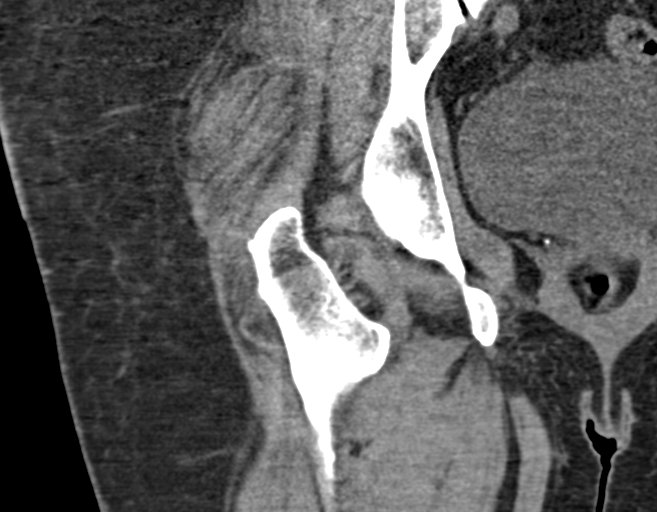

[17 of 46 positions shown; findings below may reference images not displayed]

FINDINGS: Bones/Joint/Cartilage

No acute hip fracture or dislocation. Mild hip joint space
narrowing. No lytic or sclerotic osseous lesion. No periosteal
reaction or bone destruction.

Mild osteoarthritis of the right sacroiliac joint.

Ligaments

Suboptimally assessed by CT.

Muscles and Tendons

Muscles are normal.  No muscle atrophy.

Soft tissues

No fluid collection or hematoma.
IMPRESSION: No acute osseous injury of the right hip.

## 2017-01-19 IMAGING — CR DG KNEE 1-2V*R*
2 series · 2 of 2 positions shown · non-contrast
Comparison: Right knee radiographs performed 03/09/2016, and MRI of
the right knee performed 05/23/2016

CLINICAL DATA: Status post fall onto right side, with diffuse right
knee pain. Initial encounter.

EXAM:
RIGHT KNEE - 1-2 VIEW

[knee ap]
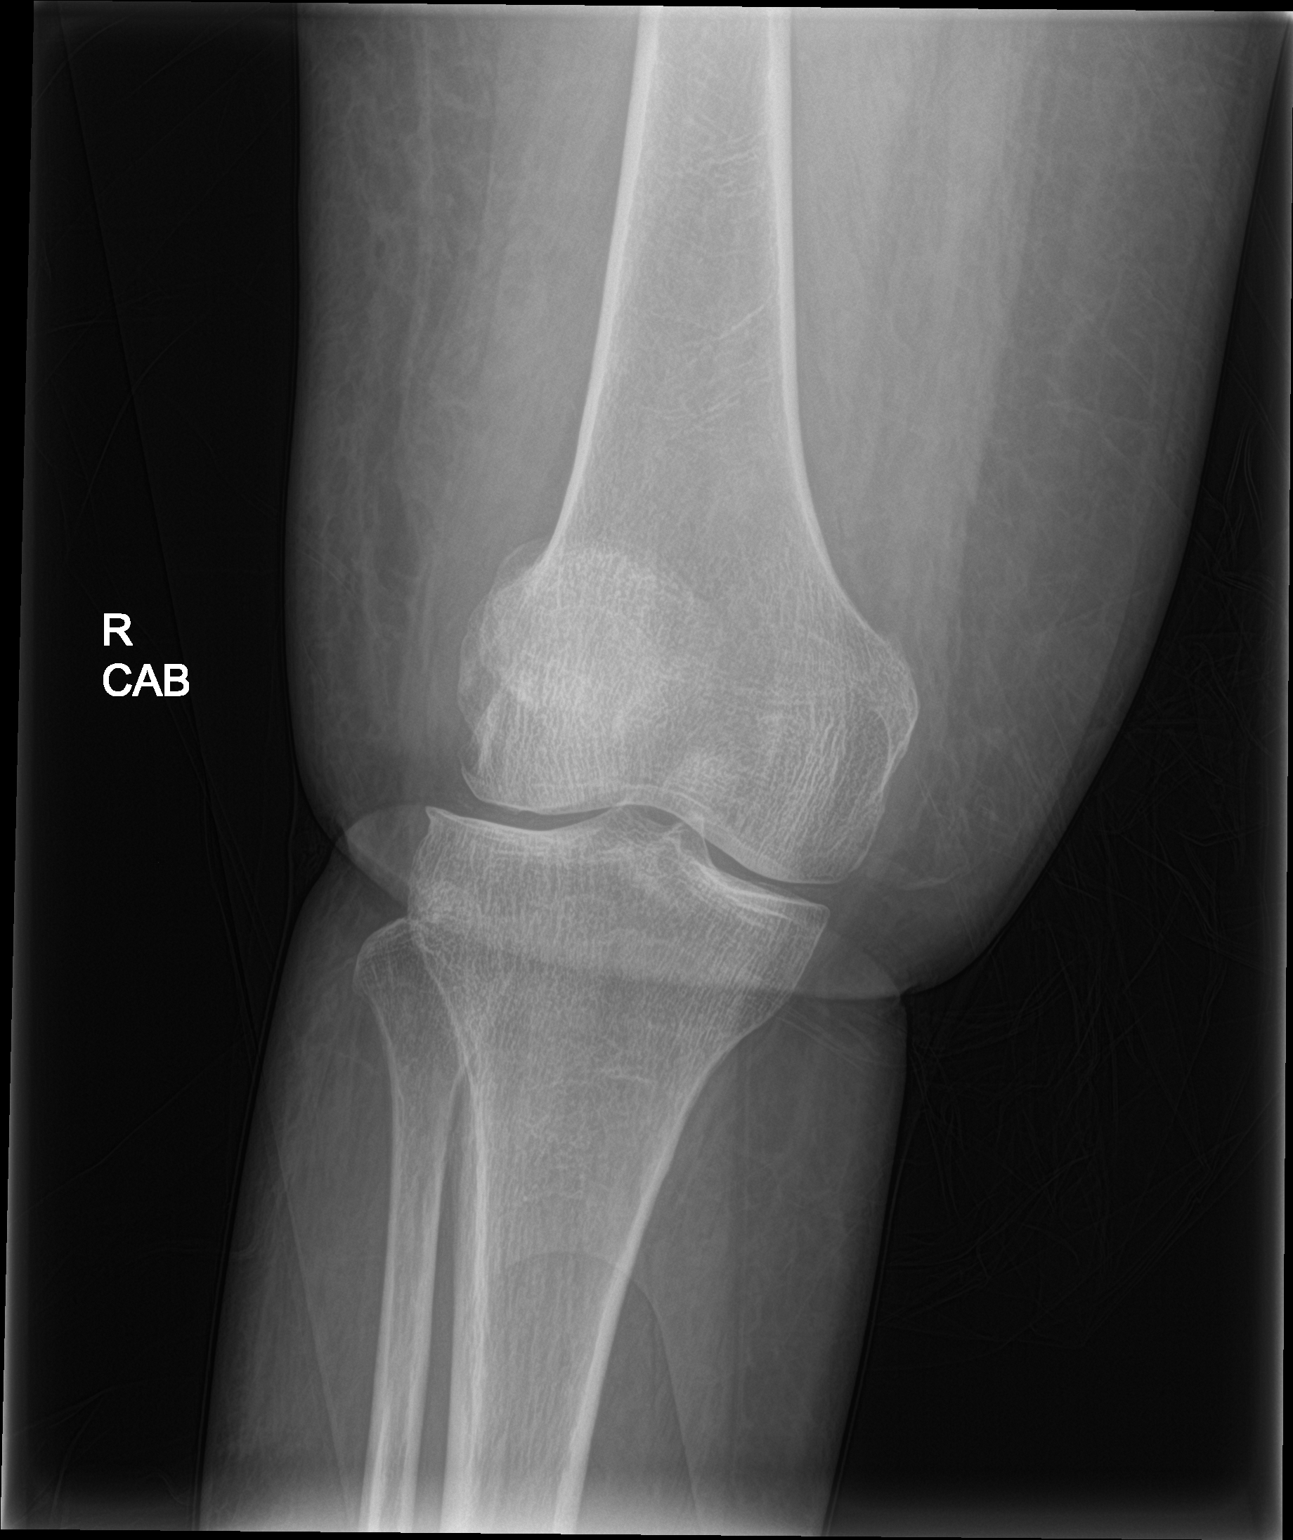

[knee lat]
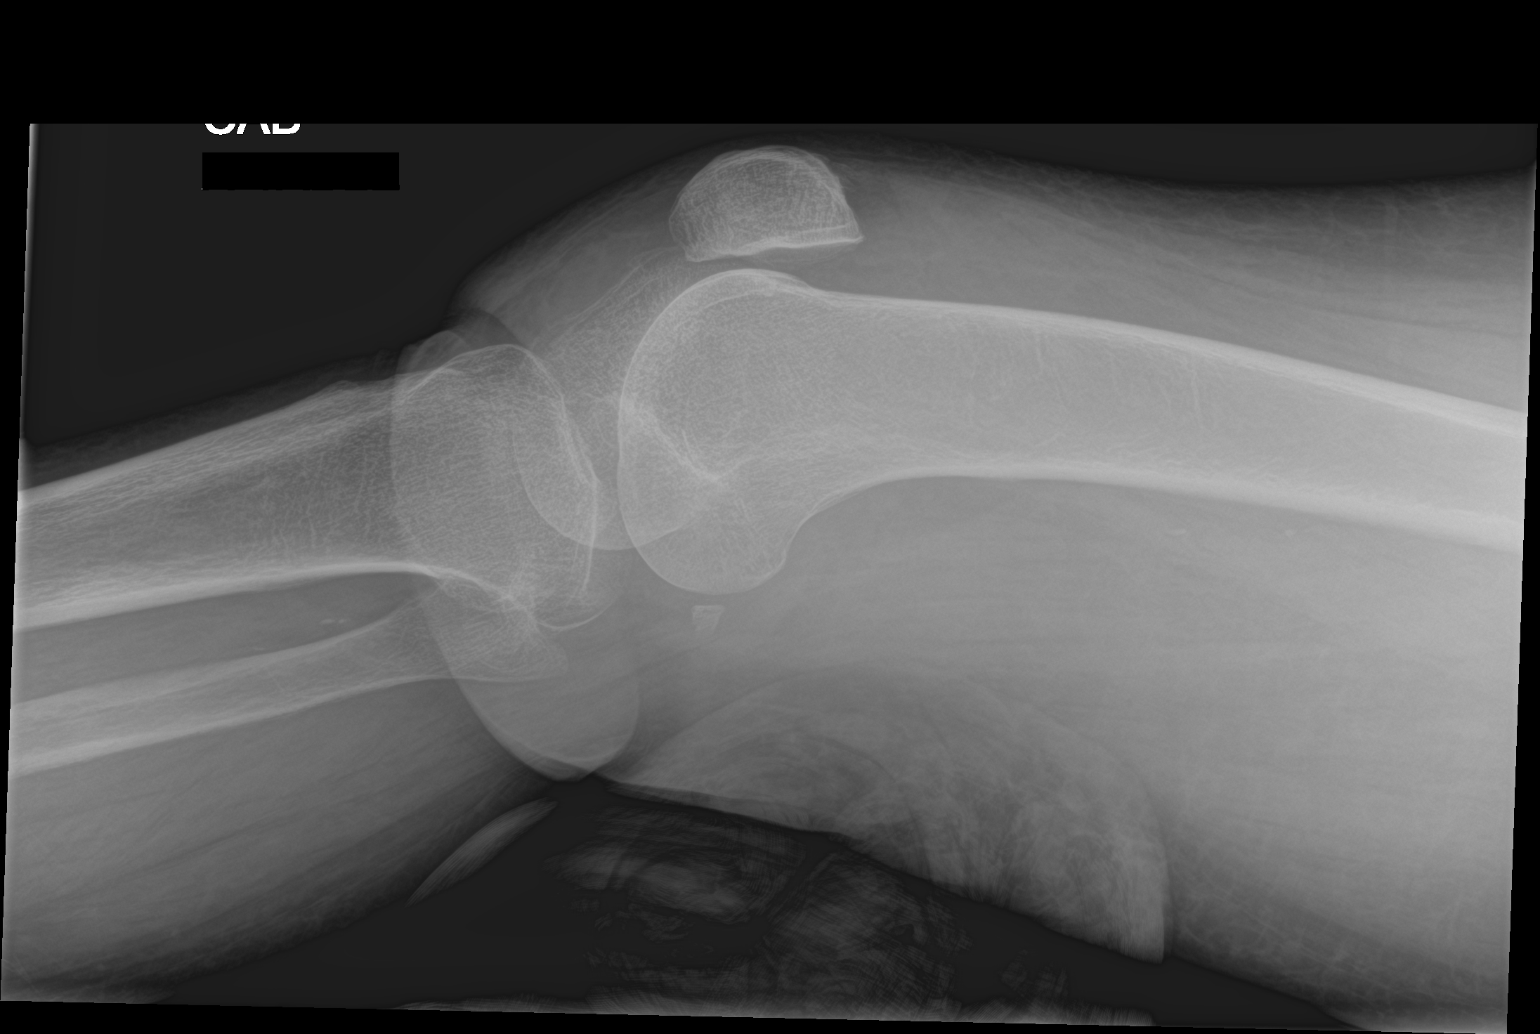

[2 of 2 positions shown; findings below may reference images not displayed]

FINDINGS: There is no evidence of fracture or dislocation. The joint spaces
are preserved. Mild marginal osteophyte formation is noted at the
lateral compartment. Mild cortical irregularity is seen along the
articular surface of the patella.

A small knee joint effusion is seen. A fabella is noted. The
visualized soft tissues are normal in appearance.
IMPRESSION: 1. No evidence of fracture or dislocation.
2. Mild degenerative change at the lateral and patellofemoral
compartments.
3. Small knee joint effusion noted.

## 2017-02-02 ENCOUNTER — Ambulatory Visit
Payer: Medicare Other | Attending: Student in an Organized Health Care Education/Training Program | Admitting: Student in an Organized Health Care Education/Training Program

## 2017-02-02 ENCOUNTER — Encounter: Payer: Self-pay | Admitting: Student in an Organized Health Care Education/Training Program

## 2017-02-02 VITALS — BP 104/64 | HR 91 | Temp 98.2°F | Resp 18 | Ht 63.0 in | Wt 230.0 lb

## 2017-02-02 DIAGNOSIS — G894 Chronic pain syndrome: Secondary | ICD-10-CM | POA: Diagnosis not present

## 2017-02-02 DIAGNOSIS — G4733 Obstructive sleep apnea (adult) (pediatric): Secondary | ICD-10-CM | POA: Insufficient documentation

## 2017-02-02 DIAGNOSIS — R42 Dizziness and giddiness: Secondary | ICD-10-CM | POA: Diagnosis not present

## 2017-02-02 DIAGNOSIS — Z9889 Other specified postprocedural states: Secondary | ICD-10-CM | POA: Insufficient documentation

## 2017-02-02 DIAGNOSIS — E1165 Type 2 diabetes mellitus with hyperglycemia: Secondary | ICD-10-CM

## 2017-02-02 DIAGNOSIS — M79604 Pain in right leg: Secondary | ICD-10-CM | POA: Diagnosis not present

## 2017-02-02 DIAGNOSIS — Z794 Long term (current) use of insulin: Secondary | ICD-10-CM | POA: Insufficient documentation

## 2017-02-02 DIAGNOSIS — Z800 Family history of malignant neoplasm of digestive organs: Secondary | ICD-10-CM | POA: Insufficient documentation

## 2017-02-02 DIAGNOSIS — G8929 Other chronic pain: Secondary | ICD-10-CM | POA: Diagnosis present

## 2017-02-02 DIAGNOSIS — M5489 Other dorsalgia: Secondary | ICD-10-CM | POA: Diagnosis not present

## 2017-02-02 DIAGNOSIS — J45909 Unspecified asthma, uncomplicated: Secondary | ICD-10-CM | POA: Insufficient documentation

## 2017-02-02 DIAGNOSIS — Z79899 Other long term (current) drug therapy: Secondary | ICD-10-CM | POA: Diagnosis not present

## 2017-02-02 DIAGNOSIS — B181 Chronic viral hepatitis B without delta-agent: Secondary | ICD-10-CM | POA: Diagnosis not present

## 2017-02-02 DIAGNOSIS — E559 Vitamin D deficiency, unspecified: Secondary | ICD-10-CM | POA: Insufficient documentation

## 2017-02-02 DIAGNOSIS — I10 Essential (primary) hypertension: Secondary | ICD-10-CM

## 2017-02-02 DIAGNOSIS — R0600 Dyspnea, unspecified: Secondary | ICD-10-CM | POA: Insufficient documentation

## 2017-02-02 DIAGNOSIS — M79601 Pain in right arm: Secondary | ICD-10-CM | POA: Insufficient documentation

## 2017-02-02 DIAGNOSIS — G4700 Insomnia, unspecified: Secondary | ICD-10-CM | POA: Diagnosis not present

## 2017-02-02 DIAGNOSIS — Z981 Arthrodesis status: Secondary | ICD-10-CM | POA: Diagnosis not present

## 2017-02-02 DIAGNOSIS — M797 Fibromyalgia: Secondary | ICD-10-CM | POA: Diagnosis not present

## 2017-02-02 DIAGNOSIS — R911 Solitary pulmonary nodule: Secondary | ICD-10-CM | POA: Diagnosis not present

## 2017-02-02 DIAGNOSIS — K7581 Nonalcoholic steatohepatitis (NASH): Secondary | ICD-10-CM | POA: Diagnosis not present

## 2017-02-02 DIAGNOSIS — E785 Hyperlipidemia, unspecified: Secondary | ICD-10-CM | POA: Diagnosis not present

## 2017-02-02 DIAGNOSIS — Z801 Family history of malignant neoplasm of trachea, bronchus and lung: Secondary | ICD-10-CM | POA: Insufficient documentation

## 2017-02-02 DIAGNOSIS — E118 Type 2 diabetes mellitus with unspecified complications: Secondary | ICD-10-CM

## 2017-02-02 DIAGNOSIS — F319 Bipolar disorder, unspecified: Secondary | ICD-10-CM | POA: Insufficient documentation

## 2017-02-02 DIAGNOSIS — M170 Bilateral primary osteoarthritis of knee: Secondary | ICD-10-CM | POA: Diagnosis not present

## 2017-02-02 DIAGNOSIS — K219 Gastro-esophageal reflux disease without esophagitis: Secondary | ICD-10-CM | POA: Insufficient documentation

## 2017-02-02 DIAGNOSIS — Z833 Family history of diabetes mellitus: Secondary | ICD-10-CM | POA: Insufficient documentation

## 2017-02-02 DIAGNOSIS — I87029 Postthrombotic syndrome with inflammation of unspecified lower extremity: Secondary | ICD-10-CM

## 2017-02-02 DIAGNOSIS — Z87891 Personal history of nicotine dependence: Secondary | ICD-10-CM | POA: Diagnosis not present

## 2017-02-02 DIAGNOSIS — M25561 Pain in right knee: Secondary | ICD-10-CM | POA: Insufficient documentation

## 2017-02-02 DIAGNOSIS — M79605 Pain in left leg: Secondary | ICD-10-CM | POA: Insufficient documentation

## 2017-02-02 DIAGNOSIS — Z803 Family history of malignant neoplasm of breast: Secondary | ICD-10-CM | POA: Insufficient documentation

## 2017-02-02 DIAGNOSIS — M47816 Spondylosis without myelopathy or radiculopathy, lumbar region: Secondary | ICD-10-CM | POA: Diagnosis not present

## 2017-02-02 DIAGNOSIS — Z9049 Acquired absence of other specified parts of digestive tract: Secondary | ICD-10-CM | POA: Insufficient documentation

## 2017-02-02 MED ORDER — DICLOFENAC SODIUM 1 % TD GEL: 4.0000 g | Freq: Three times a day (TID) | TRANSDERMAL | 1 refills | Status: DC | PRN

## 2017-02-02 MED ORDER — TIZANIDINE HCL 4 MG PO TABS: 4.0000 mg | ORAL_TABLET | Freq: Two times a day (BID) | ORAL | 1 refills | Status: DC | PRN

## 2017-02-02 NOTE — Progress Notes (Signed)
Patient's Name: Stephanie [name]  MRN: [id number]  Referring Provider: Abby Potash, PA-C  DOB: [birth year]  PCP: Sarajane Jews, MD  DOS: 02/02/2017  Note by: Gillis Santa, MD  Service setting: Ambulatory outpatient  Specialty: Interventional Pain Management  Location: [facility] (AMB) Pain Management Facility  Visit type: Initial Patient Evaluation  Patient type: New Patient   Primary Reason(s) for Visit: Encounter for initial evaluation of one or more chronic problems (new to examiner) potentially causing chronic pain, and posing a threat to normal musculoskeletal function. (Level of risk: High) CC: Knee Pain (right); Arm Pain (right); Leg Pain (bilateral); and Back Pain (lumbar and thoracic)  HPI  Stephanie [name] is a 12 y.o. year old, female patient, who comes today to see [name] for the first time for an initial evaluation of her chronic pain. She has UNSPECIFIED VITAMIN D DEFICIENCY; HLD (hyperlipidemia); TOBACCO ABUSE; OSA (obstructive sleep apnea); RESTLESS LEGS SYNDROME; Essential hypertension; Asthma; BACK PAIN, LUMBAR, CHRONIC; INSOMNIA; UNS ADVRS EFF OTH RX MEDICINAL&BIOLOGICAL SBSTNC; BIPOLAR AFFECTIVE DISORDER, HX OF; Aortic dissection ([place]); Anxiety; Cirrhosis of liver ([place]); Varicose veins of left lower extremity; GERD (gastroesophageal reflux disease); Dyspnea; Lung nodule; DNR (do not resuscitate); Encounter for routine gynecological examination; Poorly controlled type 2 diabetes mellitus with complication ([place]); Dizziness and giddiness; DOE (dyspnea on exertion); Liver cirrhosis secondary to NASH ([place]); Abdominal pain, chronic, epigastric; Thrombocytopenia ([place]); Neuropathy; Hyperglycemia; Hypoglycemia; Iron deficiency anemia; Chest pain; Pneumonia; Pain in limb; Swelling of limb; Postphlebitic syndrome with inflammation; Lymphedema; and Encephalopathy on her problem list. Today she comes in for evaluation of her Knee Pain (right); Arm Pain (right); Leg Pain (bilateral); and Back Pain (lumbar and  thoracic)  Pain Assessment: Location: Right Knee Radiating: radiates around knee Onset: More than a month ago Duration: Chronic pain Quality: Sharp, Throbbing Severity: 8 /10 (self-reported pain score)  Note: Reported level is compatible with observation.                   Effect on ADL:   Timing: Constant Modifying factors: pain meds  Onset and Duration: Date of onset: 1992 Cause of pain: Unknown Severity: Getting worse, NAS-11 at its worse: 9/10, NAS-11 at its best: 4/10, NAS-11 now: 3/10 and NAS-11 on the average: 7/10 Timing: Afternoon, Night and After a period of immobility Aggravating Factors: Kneeling, Lifiting, Prolonged sitting, Prolonged standing, Squatting, Stooping , Walking, Walking uphill and Walking downhill Alleviating Factors: Lying down, Medications, Resting, Sitting and Warm showers or baths Associated Problems: Depression, Fatigue, Inability to control bladder (urine), Numbness, Sweating, Pain that wakes patient up and Pain that does not allow patient to sleep Quality of Pain: Aching, Burning, Cramping, Sharp, Tender, Throbbing and Toothache-like Previous Examinations or Tests: Bone scan, CT scan, Endoscopy, MRI scan, X-rays and Orthopedic evaluation Previous Treatments: Physical Therapy  The patient comes into the clinics today for the first time for a chronic pain management evaluation.   Patient is a 66 year old female with multiple medical issues including history of type I aortic dissection, bipolar effective disorder, cirrhosis secondary to [facility], diabetes, hepatitis B over 20 years ago, history of CVA, obstructive sleep apnea, morbid obesity, restless leg syndrome who presents with multiple pain complaints including neck, bilateral arms, bilateral hands, thoracolumbar spine, hips, bilateral knees, bilateral ankles. Patient does have a history of fibromyalgia as well as 5 lumbar spine surgeries from 2011 2 2015.  Patient currently resides in an assisted living  facility. She is currently on oxycodone 5 mg every 4 hours as needed. Patient states  that she does not get her nighttime doses of oxycodone. She is also prescribed tramadol 50 mg daily for breakthrough pain.  Patient also states that she has had a spinal cord stimulator in place in the past however after a fall, the IPG fractured and was taken out. Patient still has epidural electrodes from SCS in place.  Today I took the time to provide the patient with information regarding my pain practice. The patient was informed that my practice is divided into two sections: an interventional pain management section, as well as a completely separate and distinct medication management section. I explained that I have procedure days for my interventional therapies, and evaluation days for follow-ups and medication management. Because of the amount of documentation required during both, they are kept separated. This means that there is the possibility that she may be scheduled for a procedure on one day, and medication management the next. I have also informed her that because of staffing and facility limitations, I no longer take patients for medication management only. To illustrate the reasons for this, I gave the patient the example of surgeons, and how inappropriate it would be to refer a patient to his/her care, just to write for the post-surgical antibiotics on a surgery done by a different surgeon.   Because interventional pain management is my board-certified specialty, the patient was informed that joining my practice means that they are open to any and all interventional therapies. I made it clear that this does not mean that they will be forced to have any procedures done. What this means is that I believe interventional therapies to be essential part of the diagnosis and proper management of chronic pain conditions. Therefore, patients not interested in these interventional alternatives will be better served  under the care of a different practitioner.  The patient was also made aware of my Comprehensive Pain Management Safety Guidelines where by joining my practice, they limit all of their nerve blocks and joint injections to those done by our practice, for as long as we are retained to manage their care.   Historic Controlled Substance Pharmacotherapy Review  PMP and historical list of controlled substances: Oxycodone 5 mg every 4 hours as needed for severe pain, tramadol 50 mg daily. Patient resides in assisted living facility. Her current controlled substances are not present on the [place] controlled substance database but these prescriptions were verified in her medical record. Current opioid analgesics: As above MME/day: Approximately 40 mg/day Medications: The patient did not bring the medication(s) to the appointment, as requested in our "New Patient Package" Pharmacodynamics: Desired effects: Analgesia: The patient reports >50% benefit. Reported improvement in function: The patient reports medication allows her to accomplish basic ADLs. Clinically meaningful improvement in function (CMIF): Sustained CMIF goals met Perceived effectiveness: Described as relatively effective, allowing for increase in activities of daily living (ADL) Undesirable effects: Side-effects or Adverse reactions: None reported Historical Monitoring: The patient  reports that she does not use drugs. List of all UDS Test(s): No results found for: MDMA, COCAINSCRNUR, PCPSCRNUR, PCPQUANT, CANNABQUANT, THCU, [place] List of all Serum Drug Screening Test(s):  No results found for: AMPHSCRSER, BARBSCRSER, BENZOSCRSER, COCAINSCRSER, PCPSCRSER, PCPQUANT, THCSCRSER, CANNABQUANT, OPIATESCRSER, OXYSCRSER, PROPOXSCRSER Historical Background Evaluation: [place] PDMP: Six (6) year initial data search conducted.             [place] Department of public safety, offender search: Editor, commissioning Information) Non-contributory Risk Assessment  Profile: Aberrant behavior: None observed or detected today Risk factors for fatal opioid  overdose: Benzodiazepine use, bipolar disorder and sleep apnea Fatal overdose hazard ratio (HR): Calculation deferred Non-fatal overdose hazard ratio (HR): Calculation deferred Risk of opioid abuse or dependence: 0.7-3.0% with doses ? 36 MME/day and 6.1-26% with doses ? 120 MME/day. Substance use disorder (SUD) risk level: Pending results of Medical Psychology Evaluation for SUD Opioid risk tool (ORT) (Total Score): 5     Opioid Risk Tool - 02/02/17 0954      Family History of Substance Abuse   Alcohol Positive Female   Illegal Drugs Positive Female   Rx Drugs Negative     Personal History of Substance Abuse   Alcohol Negative   Illegal Drugs Negative   Rx Drugs Negative     Age   Age between 67-45 years  No     History of Preadolescent Sexual Abuse   History of Preadolescent Sexual Abuse Negative or Female     Psychological Disease   Psychological Disease Positive   Bipolar Positive     Total Score   Opioid Risk Tool Scoring 5   Opioid Risk Interpretation Moderate Risk      Pharmacologic Plan: Pending ordered tests and/or consults            Initial impression: Pending review of available data and ordered tests.  Meds   Current Outpatient Prescriptions:  .  acetaminophen (TYLENOL) 325 MG tablet, Take 650 mg by mouth every 4 (four) hours as needed (for fever, minor discomfort, or headache). , Disp: , Rfl:  .  albuterol (PROVENTIL HFA;VENTOLIN HFA) 108 (90 Base) MCG/ACT inhaler, Inhale 2 puffs into the lungs every 6 (six) hours as needed for wheezing or shortness of breath., Disp: 1 Inhaler, Rfl: 2 .  albuterol (PROVENTIL) (2.5 MG/3ML) 0.083% nebulizer solution, Take 2.5 mg by nebulization every 6 (six) hours as needed for wheezing or shortness of breath., Disp: , Rfl:  .  barrier cream (NON-SPECIFIED) CREA, Apply 1 application topically as needed (APPLIED AFTER TOILETING TO PREVENT  SKIN BREAKDOWN)., Disp: , Rfl:  .  budesonide-formoterol (SYMBICORT) 160-4.5 MCG/ACT inhaler, Inhale 2 puffs into the lungs 2 (two) times daily. (0800 & 2000), Disp: , Rfl:  .  buPROPion (WELLBUTRIN SR) 150 MG 12 hr tablet, Take 150 mg by mouth daily. (0800), Disp: , Rfl:  .  clonazePAM (KLONOPIN) 0.5 MG tablet, Take 0.25 mg by mouth 3 (three) times daily. (0800, 1300 & 2000), Disp: , Rfl:  .  dicyclomine (BENTYL) 10 MG capsule, Take 10 mg by mouth 3 (three) times daily. (0800, 1300 & 2000), Disp: , Rfl:  .  diphenhydrAMINE (DIPHENHIST) 25 mg capsule, Take 25 mg by mouth every 6 (six) hours as needed (for allergic reaction (notify provider is symptoms persist more than 24 hrs*)., Disp: , Rfl:  .  Emollient (EUCERIN INTENSIVE REPAIR) LOTN, Apply 1 application topically 2 (two) times daily., Disp: , Rfl:  .  ferrous sulfate 325 (65 FE) MG tablet, Take 325 mg by mouth 2 (two) times daily. (0800 & 2000), Disp: , Rfl:  .  furosemide (LASIX) 80 MG tablet, Take 80 mg by mouth daily. (0800), Disp: , Rfl:  .  gabapentin (NEURONTIN) 600 MG tablet, Take 600 mg by mouth 2 (two) times daily. (0800 & 2000), Disp: , Rfl:  .  guaifenesin (ROBITUSSIN) 100 MG/5ML syrup, Take 300 mg by mouth every 6 (six) hours as needed for cough. , Disp: , Rfl:  .  insulin glargine (LANTUS) 100 UNIT/ML injection, Inject 28 Units into the skin at bedtime., Disp: ,  Rfl:  .  insulin lispro (HUMALOG KWIKPEN) 100 UNIT/ML KiwkPen, Inject 0-9 Units into the skin 3 (three) times daily. Check FSBS before each meal & cover with SSI (sliding scale insulin), Disp: , Rfl:  .  lactulose (CHRONULAC) 10 GM/15ML solution, Take 10 g by mouth 3 (three) times a week., Disp: , Rfl:  .  lamoTRIgine (LAMICTAL) 100 MG tablet, Take 100 mg by mouth 2 (two) times daily. (0800 & 2000), Disp: , Rfl:  .  lidocaine (LIDODERM) 5 %, Place 3 patches onto the skin daily. , Disp: , Rfl:  .  loperamide (IMODIUM) 2 MG capsule, Take 4 mg by mouth as needed for diarrhea or  loose stools. *Do not exceed 8 doses in 24 hours*, Disp: , Rfl:  .  LYRICA 150 MG capsule, Take 150 mg by mouth 3 (three) times daily., Disp: , Rfl:  .  magnesium hydroxide (MILK OF MAGNESIA) 400 MG/5ML suspension, Take 30 mLs by mouth daily as needed (for constipation)., Disp: , Rfl:  .  Menthol-Zinc Oxide (GOLD BOND EX), Apply 1 application topically daily., Disp: , Rfl:  .  metoCLOPramide (REGLAN) 5 MG tablet, Take 5 mg by mouth 4 (four) times daily -  before meals and at bedtime. (0800, 1200, 1600 & 2000), Disp: , Rfl:  .  mirtazapine (REMERON) 15 MG tablet, Take 45 mg by mouth at bedtime. (2000) , Disp: , Rfl:  .  omeprazole (PRILOSEC) 20 MG capsule, Take 20 mg by mouth daily before breakfast. (0630), Disp: , Rfl:  .  ondansetron (ZOFRAN) 4 MG tablet, Take 4 mg by mouth every 4 (four) hours as needed for nausea or vomiting., Disp: , Rfl:  .  oxybutynin (DITROPAN-XL) 5 MG 24 hr tablet, Take 5 mg by mouth daily. (0800), Disp: , Rfl:  .  oxyCODONE (ROXICODONE) 5 MG immediate release tablet, Take 1 tablet (5 mg total) by mouth every 4 (four) hours as needed for severe pain., Disp: 40 tablet, Rfl: 0 .  potassium chloride SA (K-DUR,KLOR-CON) 20 MEQ tablet, Take 20 mEq by mouth daily. (0800), Disp: , Rfl:  .  pyrithione zinc (HEAD AND SHOULDERS) 1 % shampoo, Apply 1 application topically daily. t-gel or head and shoulders., Disp: , Rfl:  .  rOPINIRole (REQUIP) 3 MG tablet, Take 3 mg by mouth at bedtime. (2000), Disp: , Rfl:  .  simvastatin (ZOCOR) 10 MG tablet, Take 10 mg by mouth at bedtime. (2000), Disp: , Rfl:  .  spironolactone (ALDACTONE) 25 MG tablet, Take 25 mg by mouth daily. (0800), Disp: , Rfl:  .  traMADol (ULTRAM) 50 MG tablet, Take 50 mg by mouth 2 (two) times daily. (0800 & 2000), Disp: , Rfl:  .  vitamin C (ASCORBIC ACID) 500 MG tablet, Take 500 mg by mouth 2 (two) times daily. (0800 & 2000), Disp: , Rfl:  .  Water For Irrigation, Sterile (STERILE WATER FOR IRRIGATION), Irrigate with as  directed at bedtime. Use with CPAP machine at bedtime during sleep & remove in the morning, Disp: , Rfl:  .  zolpidem (AMBIEN) 5 MG tablet, Take 5 mg by mouth at bedtime. (2000), Disp: , Rfl:  .  clonazePAM (KLONOPIN) 0.25 MG disintegrating tablet, 0.25 mg 3 (three) times daily. , Disp: , Rfl:  .  diclofenac sodium (VOLTAREN) 1 % GEL, Apply 4 g topically every 8 (eight) hours as needed (pain). Apply to right knee, Disp: 1 Tube, Rfl: 1 .  tiZANidine (ZANAFLEX) 4 MG tablet, Take 1 tablet (4 mg total)  by mouth 2 (two) times daily as needed for muscle spasms., Disp: 60 tablet, Rfl: 1  Imaging Review  Cervical Imaging:  Cervical CT wo contrast:  Results for orders placed during the hospital encounter of 07/14/16  CT Cervical Spine Wo Contrast   Narrative CLINICAL DATA:  Fall.  Fall from chair.  Neck pain.  EXAM: CT HEAD WITHOUT CONTRAST  CT CERVICAL SPINE WITHOUT CONTRAST  TECHNIQUE: Multidetector CT imaging of the head and cervical spine was performed following the standard protocol without intravenous contrast. Multiplanar CT image reconstructions of the cervical spine were also generated.  COMPARISON:  Neck CT 09/01/2015  FINDINGS: CT HEAD FINDINGS  Brain: No intracranial hemorrhage. No parenchymal contusion. No midline shift or mass effect. Basilar cisterns are patent. No skull base fracture. No fluid in the paranasal sinuses or mastoid air cells. Orbits are normal.  Vascular: No hyperdense vessel or unexpected calcification.  Skull: Normal. Negative for fracture or focal lesion.  Sinuses/Orbits: Paranasal sinuses and mastoid air cells are clear. Orbits are clear.  Other: None.  CT CERVICAL SPINE FINDINGS  Alignment: Normal alignment  Skull base and vertebrae: Normal craniocervical junction. Anterior cervical fusion from C5-C7. No acute loss vertebral body height and disc height. No subluxation.  Soft tissues and spinal canal: No epidural paraspinal  hematoma.  Disc levels:  Unremarkable  Upper chest: Clear  Other: None  IMPRESSION: 1. No intracranial trauma. 2. No acute cervical spine findings. 3. Anterior cervical fusion without complication.  Launch template CT CAP with   Electronically Signed   By: Suzy Bouchard M.D.   On: 07/14/2016 20:03      Shoulder-R DG:  Results for orders placed during the hospital encounter of 07/14/16  DG Shoulder Right   Narrative CLINICAL DATA:  Patient fell from chair.  Pain.  EXAM: RIGHT SHOULDER - 2+ VIEW  COMPARISON:  09/01/2015 radiographs of the right shoulder  FINDINGS: Osteoarthritis of the [facility] joint with adjacent ossicles. Osteoarthritic joint space narrowing and sclerosis of the glenohumeral joint without malalignment. No acute displaced fracture or bone destruction. Previously suspected nondisplaced fracture at the base the greater tuberosity is less apparent on current exam.  Patient status post C5 through C7 ACDF. Adjacent ribs and lung are nonacute.  IMPRESSION: No acute osseous abnormality or malalignment. AC and glenohumeral joint osteoarthritis.  ACDF of the cervical spine.   Electronically Signed   By: Ashley Royalty M.D.   On: 07/14/2016 20:01     Thoracic DG 2-3 views:  Results for orders placed during the hospital encounter of 07/14/16  [facility] Thoracic Spine 2 View   Narrative CLINICAL DATA:  Patient fell off of a chair.  Pain.  EXAM: THORACIC SPINE 2 VIEWS  COMPARISON:  CT 08/23/2016.  FINDINGS: Neural stimulator device projects up to the T8-T9 disc level. No acute fracture of the thoracic spine. Small anterior osteophytes are noted mild disc space narrowing. The patient is status post low anterior cervical disc fusion from C5 through C7.  IMPRESSION: No acute osseous abnormality the thoracic spine. Neural stimulator device projects up to the T8-T9 level. ACDF of the lower cervical spine.   Electronically Signed   By: Ashley Royalty M.D.    On: 07/14/2016 19:44     Lumbar DG 2-3 views:  Results for orders placed during the hospital encounter of 03/21/15  DG Lumbar Spine 2-3 Views   Narrative CLINICAL DATA:  66 year old female with history of trauma from a fall today onto cement. Chronic back pain, acutely  worsened after the fall.  EXAM: LUMBAR SPINE - 2-3 VIEW  COMPARISON:  Multiple priors, most recently lumbar spine radiograph 02/24/2015.  FINDINGS: Three views of the lumbar spine again demonstrate postoperative changes of PLIF at L3-L4. Interbody grafts are noted at L3-L4, L4-L5 and L5-S1. There is 5 mm of anterolisthesis of L3 upon L4 (unchanged). Alignment is otherwise anatomic. Lucency adjacent to the screws at both levels is noted, which may suggest some mild hardware loosening. No fracture of the hardware identified. No acute displaced fracture or compression type fracture in the lumbar spine. Multilevel degenerative disc disease and mild multilevel facet arthropathy again noted.  IMPRESSION: 1. No acute radiographic abnormality of the lumbar spine. 2. Postoperative changes redemonstrated, as above, with stable postoperative appearance of the spine. There does appear to be some mild lucency adjacent to the spinal hardware, which could suggest a small amount of hardware loosening.   Electronically Signed   By: Vinnie Langton M.D.   On: 03/21/2015 10:58    Lumbar DG (Complete) 4+V:  Results for orders placed during the hospital encounter of 01/02/13  DG Lumbar Spine Complete   Narrative *RADIOLOGY REPORT*  Clinical Data: Low back pain.  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  Five previous lumbar surgeries.  LUMBAR SPINE - COMPLETE 4+ VIEW  Comparison: Radiographs dated 11/16/2009  Findings: There are solid, body and posterior fusions at L4-5 and L5-L1.  There is nonunion of the fusion at L3-4 with lucency around the pedicle screws in L3 and L4, unchanged since the prior radiograph of  11/16/2009.  Neurostimulator device is seen in the lower thoracic spine. The wires are not attached to the generator.  IMPRESSION: Stable appearance of the lumbar spine since the prior study. 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   Original Report Authenticated By: Lorriane Shire, M.D.    Lumbar DG F/E views:  Results for orders placed in visit on 05/04/12  DG Lumb Spine Flex&Ext Only   Lumbar DG Bending views:  Results for orders placed in visit on 04/18/12  DG Lumbar Spine Complete W/Bend   Narrative   PRIOR REPORT IMPORTED FROM THE [employer] WORKFLOW SYSTEM   REASON FOR EXAM:    low back pain ,hx of partillay removed spinal cord  stimulator systems myalgias  COMMENTS:   PROCEDURE:     DXR - DXR LSP COMPLETE W/ FLEX/EXTEN  - Apr 18 2012 11:03AM   RESULT:     Previous posterior stabilization changes are noted at L3-L4.  Facet hypertrophy is present. Spinal canal electrodes are seen with the  superior tip at the T8-T9 level. Multilevel facet hypertrophy is present.  There is anterolisthesis of L4 on L5 there appears to be grade 1. L5-S1  disc  space narrowing is present. The hardware appears intact. Flexion and  extension lateral views show no evidence of instability. There is  essentially no flexion and only minimal extension.   IMPRESSION:      Please see above.   Dictation Site: 2          Hip-R CT wo contrast:  Results for orders placed during the hospital encounter of 05/31/16  CT HIP RIGHT WO CONTRAST   Narrative CLINICAL DATA:  Status post fall, right hip pain  EXAM: CT OF THE RIGHT HIP WITHOUT CONTRAST  TECHNIQUE: Multidetector CT imaging of the right hip was performed according to the standard protocol. Multiplanar CT image reconstructions were also generated.  COMPARISON:  None.  FINDINGS: Bones/Joint/Cartilage  No acute  hip fracture or dislocation. Mild hip joint space narrowing. No lytic or sclerotic osseous lesion. No periosteal reaction  or bone destruction.  Mild osteoarthritis of the right sacroiliac joint.  Ligaments  Suboptimally assessed by CT.  Muscles and Tendons  Muscles are normal.  No muscle atrophy.  Soft tissues  No fluid collection or hematoma.  IMPRESSION: No acute osseous injury of the right hip.   Electronically Signed   By: Kathreen Devoid   On: 05/31/2016 20:00    Hip-L CT wo contrast: No results found for this or any previous visit. Hip-R DG 2-3 views:  Results for orders placed during the hospital encounter of 07/14/16  DG Hip Unilat W or Wo Pelvis 2-3 Views Right   Narrative CLINICAL DATA:  Right hip pain after fall from chair  EXAM: DG HIP (WITH OR WITHOUT PELVIS) 2-3V RIGHT  COMPARISON:  05/31/2016  FINDINGS: There is no evidence of hip fracture or dislocation. L3-4 bilateral pedicle screw fixation with interbody block noted. Lower lumbar facet arthropathy L4-5 bilaterally. The SI joints and pubic symphysis are maintained. No pelvic fracture. Hip joints are intact with slight joint space narrowing bilaterally.  IMPRESSION: No acute radiographic abnormality of the bony pelvis and right hip.  L3-4 pedicle screw fixation with interbody block.   Electronically Signed   By: Ashley Royalty M.D.   On: 07/14/2016 19:45      Knee-R MR wo contrast:  Results for orders placed during the hospital encounter of 05/23/16  MR KNEE RIGHT WO CONTRAST   Narrative CLINICAL DATA:  Right knee pain and swelling for 4-5 months. No known injury.  EXAM: MRI OF THE RIGHT KNEE WITHOUT CONTRAST  TECHNIQUE: Multiplanar, multisequence MR imaging of the knee was performed. No intravenous contrast was administered.  COMPARISON:  Plain films right knee 08/06/2012 and 03/09/2016.  FINDINGS: MENISCI  Medial meniscus: Degenerative signal is seen in the posterior horn. No tear.  Lateral meniscus:  Intact.  LIGAMENTS  Cruciates:  Intact.  Collaterals:   Intact.  CARTILAGE  Patellofemoral: Extensive cartilage loss is present along the lateral femoral trochlea and lateral patellar facet with associated marrow edema. Small subchondral cysts in the lateral patellar facet also noted.  Medial:  Minimally degenerated.  Lateral:  Negative.  Joint: Small to moderate joint effusion is identified. Loose hyaline cartilage fragment along the medial aspect of the intercondylar notch of the femur measures 0.9 cm in diameter. A 0.5 cm in diameter loose cartilage fragment is seen along the medial femoral condyle.  Popliteal Fossa:  No Baker's cyst.  Extensor Mechanism:  Intact.  Bones: No fracture or worrisome lesion. Small lateral compartment osteophytes noted.  Other: None.  IMPRESSION: Negative for meniscal or ligament tear.  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  Two loose cartilage fragments are identified in the joint. One is along the medial femoral condyle and a second is seen along the medial side of the intercondylar notch of the femur.   Electronically Signed   By: Inge Rise M.D.   On: 05/23/2016 14:33    t. Knee-R DG 1-2 views:  Results for orders placed during the hospital encounter of 05/31/16  DG Knee 2 Views Right   Narrative CLINICAL DATA:  Status post fall onto right side, with diffuse right knee pain. Initial encounter.  EXAM: RIGHT KNEE - 1-2 VIEW  COMPARISON:  Right knee radiographs performed 03/09/2016, and MRI of the right knee performed 05/23/2016  FINDINGS: There is no evidence of fracture or dislocation. The joint spaces are preserved. Mild marginal osteophyte formation is noted at the lateral compartment. Mild cortical irregularity is seen along the articular surface of the patella.  A small knee joint effusion is seen. A fabella is noted. The visualized soft tissues are normal in  appearance.  IMPRESSION: 1. No evidence of fracture or dislocation. 2. Mild degenerative change at the lateral and patellofemoral compartments. 3. Small knee joint effusion noted.   Electronically Signed   By: Garald Balding M.D.   On: 05/31/2016 17:31     Knee-R DG 4 views:  Results for orders placed during the hospital encounter of 12/09/16  DG Knee Complete 4 Views Right   Narrative CLINICAL DATA:  Pt c/o right knee pain since arthroscopic surgery on 11-15-16. No injury since surgery.  EXAM: RIGHT KNEE - COMPLETE 4+ VIEW  COMPARISON:  11/23/2016  FINDINGS: No fracture or bone lesion.  Mild mediolateral joint space compartment narrowing. Small marginal osteophytes from all 3 compartments.  Small joint effusion suggested. There is mild anterolateral subcutaneous soft tissue edema.  IMPRESSION: 1. No fracture or bone lesion. 2. Mild arthropathic changes, most likely osteoarthritis. 3. Probable small joint effusion.   Electronically Signed   By: Lajean Manes M.D.   On: 12/09/2016 13:28     Complexity Note: Imaging results reviewed. Results discussed using Layman's terms.               ROS  Cardiovascular History: Heart murmur Pulmonary or Respiratory History: Wheezing and difficulty taking a deep full breath (Asthma), Shortness of breath, Smoking, Snoring  and Temporary stoppage of breathing during sleep Neurological History: No reported neurological signs or symptoms such as seizures, abnormal skin sensations, urinary and/or fecal incontinence, being born with an abnormal open spine and/or a tethered spinal cord Review of Past Neurological Studies:  Results for orders placed or performed during the hospital encounter of 07/19/16  CT HEAD WO CONTRAST   Narrative   CLINICAL DATA:  Altered mental status beginning this morning. Altered speech.  EXAM: CT HEAD WITHOUT CONTRAST  TECHNIQUE: Contiguous axial images were obtained from the base of the  skull through the vertex without intravenous contrast.  COMPARISON:  Head CT scan 07/14/2016 and 03/21/2015.  FINDINGS: Brain: Appears normal without hemorrhage, infarct, mass lesion, mass effect, midline shift or abnormal extra-axial fluid collection. No hydrocephalus or pneumocephalus.  Vascular: Atherosclerosis noted.  Skull: Intact.  Sinuses/Orbits: Status post bilateral lens extraction. Otherwise negative.  Other: None.  IMPRESSION: No acute abnormality.  Atherosclerosis.   Electronically Signed   By: Inge Rise M.D.   On: 07/19/2016 10:18    *Note: Due to a large number of results and/or encounters for the requested time period, some results have not been displayed. A complete set of results can be found in Results Review.   Psychological-Psychiatric History: Depressed Gastrointestinal History: Vomiting blood (Ulcers) Genitourinary History: Kidney disease Hematological History: Brusing easily, Bleeding easily and Low platelet levels (Thrombocytopenia) Endocrine History: High blood sugar requiring insulin (IDDM) Rheumatologic History: No reported rheumatological signs and symptoms such as fatigue, joint pain, tenderness, swelling, redness, heat, stiffness, decreased range of motion, with or without associated rash Musculoskeletal History: Negative for myasthenia gravis, muscular dystrophy, multiple sclerosis or malignant hyperthermia Work History: Retired  Allergies  Ms. Pisarski is allergic to doxycycline.  Laboratory Chemistry  Inflammation Markers (CRP: Acute Phase) (ESR: Chronic Phase) Lab Results  Component Value Date   ESRSEDRATE 71 (H) 04/20/2016  Renal Function Markers Lab Results  Component Value Date   BUN 20 12/21/2016   CREATININE 0.76 12/21/2016   GFRAA >60 12/21/2016   GFRNONAA >60 12/21/2016                 Hepatic Function Markers Lab Results  Component Value Date   AST 27 11/23/2016   ALT 24 11/23/2016    ALBUMIN 3.6 11/23/2016   ALKPHOS 212 (H) 11/23/2016   HCVAB NEGATIVE 02/03/2012                 Electrolytes Lab Results  Component Value Date   NA 137 12/21/2016   K 4.3 12/21/2016   CL 102 12/21/2016   CALCIUM 9.1 12/21/2016   MG 2.4 04/05/2016                 Neuropathy Markers Lab Results  Component Value Date   VITAMINB12 431 11/13/2015                 Bone Pathology Markers Lab Results  Component Value Date   ALKPHOS 212 (H) 11/23/2016   VD25OH 30 11/12/2012   VD125OH2TOT 10 (L) 06/19/2008   CALCIUM 9.1 12/21/2016                 Coagulation Parameters Lab Results  Component Value Date   INR 1.09 11/23/2016   LABPROT 14.1 11/23/2016   APTT 38 (H) 11/23/2016   PLT 100 (L) 12/21/2016                 Cardiovascular Markers Lab Results  Component Value Date   BNP 108.0 (H) 07/28/2016   HGB 12.8 12/21/2016   HCT 37.5 12/21/2016                 Note: Lab results reviewed.  [place]  Drug: Ms. Thaxton  reports that she does not use drugs. Alcohol:  reports that she does not drink alcohol. Tobacco:  reports that she quit smoking about 3 years ago. Her smoking use included Cigarettes. She has a 75.00 pack-year smoking history. She has never used smokeless tobacco. Medical:  has a past medical history of Adenomatous colon polyp (08/27/2014); Anemia; Anxiety; Aortic dissection ([place]); Arthritis; Asthma; Bipolar affective disorder ([place]); Blood transfusion (2000); Blood transfusion without reported diagnosis; Cancer ([place]); Chronic abdominal pain; Cirrhosis ([place]); Colon polyp; Depression; Diabetes mellitus without complication ([place]); Fatty liver (04/09/08); GERD (gastroesophageal reflux disease); H/O: CVA (cardiovascular accident); H/O: rheumatic fever; Hepatitis; Hyperlipidemia; Incontinence; Insomnia; Lower extremity edema; Obstructive sleep apnea; Platelets decreased ([place]); RLS (restless legs syndrome); Shortness of breath; Sleep apnea; Stroke ([place]); Ulcer; and Unspecified  disorders of nervous system. Family: family history includes Breast cancer in her mother; Diabetes in her unknown relative; Lung cancer in her mother; Stomach cancer in her father.  Past Surgical History:  Procedure Laterality Date  . ABDOMINAL EXPLORATION SURGERY    . ABDOMINAL HYSTERECTOMY     total  . Axillary artery cannulation via 8-mm Hemashield graft, median sternotomy, extracorporeal circulation with deep hypothermic circulatory arrest, repair of  aortic dessection  01/23/2009   Dr Arlyce Dice  . BACK SURGERY      x 5  . cataract     both eyes  . CHOLECYSTECTOMY    . COLONOSCOPY    . EYE SURGERY Bilateral    Cataract Extraction with IOL  . HEMORROIDECTOMY     and colon polyp removed  . KNEE ARTHROSCOPY WITH LATERAL RELEASE Right 11/15/2016   Procedure: KNEE ARTHROSCOPY WITH  LATERAL RELEASE;  Surgeon: Corky Mull, MD;  Location: [facility];  Service: Orthopedics;  Laterality: Right;  . LIVER BIOPSY  04/10/2012   Procedure: LIVER BIOPSY;  Surgeon: Inda Castle, MD;  Location: [facility];  Service: Endoscopy;  Laterality: N/A;  ultrasound to mark order for abd limited/liver to be marked to be sent by [email or web address]   . LUMBAR FUSION  10/09  . LUMBAR WOUND DEBRIDEMENT  05/09/2011   Procedure: LUMBAR WOUND DEBRIDEMENT;  Surgeon: Dahlia Bailiff;  Location: [facility];  Service: Orthopedics;  Laterality: N/A;  IRRIGATION AND DEBRIDEMENT SPINAL WOUND  . POSTERIOR CERVICAL FUSION/FORAMINOTOMY    . ROTATOR CUFF REPAIR     left   Active Ambulatory Problems    Diagnosis Date Noted  . UNSPECIFIED VITAMIN D DEFICIENCY 09/26/2008  . HLD (hyperlipidemia) 03/08/2007  . TOBACCO ABUSE 06/19/2008  . OSA (obstructive sleep apnea) 03/08/2007  . RESTLESS LEGS SYNDROME 04/27/2007  . Essential hypertension 03/08/2007  . Asthma 03/08/2007  . BACK PAIN, LUMBAR, CHRONIC 11/19/2009  . INSOMNIA 04/27/2007  . UNS ADVRS EFF OTH RX MEDICINAL&BIOLOGICAL SBSTNC 12/16/2008  . BIPOLAR AFFECTIVE DISORDER, HX OF  03/08/2007  . Aortic dissection ([place])   . Anxiety 07/11/2011  . Cirrhosis of liver ([place]) 02/02/2012  . Varicose veins of left lower extremity 10/09/2012  . GERD (gastroesophageal reflux disease) 10/09/2012  . Dyspnea 07/10/2013  . Lung nodule 07/10/2013  . DNR (do not resuscitate) 07/29/2013  . Encounter for routine gynecological examination 07/29/2013  . Poorly controlled type 2 diabetes mellitus with complication ([place]) 41/58/3094  . Dizziness and giddiness 10/30/2013  . DOE (dyspnea on exertion) 10/30/2013  . Liver cirrhosis secondary to NASH ([place]) 02/27/2014  . Abdominal pain, chronic, epigastric 06/12/2014  . Thrombocytopenia ([place]) 08/19/2014  . Neuropathy 06/08/2015  . Hyperglycemia 07/16/2015  . Hypoglycemia 09/09/2015  . Iron deficiency anemia 01/30/2016  . Chest pain 04/04/2016  . Pneumonia 04/18/2016  . Pain in limb 05/06/2016  . Swelling of limb 05/06/2016  . Postphlebitic syndrome with inflammation 05/06/2016  . Lymphedema 06/14/2016  . Encephalopathy 07/19/2016   Resolved Ambulatory Problems    Diagnosis Date Noted  . Chronic hepatitis B virus infection ([place]) 03/08/2007  . Benign neoplasm of colon 04/04/2007  . DISORDER, NERVOUS SYSTEM NOS 03/08/2007  . RHEUMATIC FEVER 03/08/2007  . RHINITIS 08/28/2007  . SMALL BOWEL OBSTRUCTION 04/08/2008  . Other constipation 10/23/2008  . Cellulitis and abscess of other specified site 07/13/2009  . FOLLICULITIS 07/68/0881  . SKIN LESION 02/26/2008  . HIP PAIN 11/13/2007  . SCIATICA, LEFT 11/06/2009  . EDEMA, LOCALIZED 01/07/2008  . Nausea alone 10/23/2008  . INCONTINENCE 03/08/2007  . ABDOMINAL PAIN 09/26/2008  . PELVIC PAIN, ACUTE 10/26/2007  . MAMMOGRAM, ABNORMAL, RIGHT 07/22/2008  . Personal history of colonic polyps 10/23/2008  . Dysuria 05/25/2010  . INTERTRIGO, CANDIDAL 06/29/2010  . RIB PAIN, RIGHT SIDED 06/29/2010  . Routine general medical examination at a health care facility 12/13/2010  . Hyperglycemia  12/13/2010  . Localized swelling, mass, or lump of lower extremity 01/17/2011  . Preop examination 02/25/2011  . Fatigue 12/01/2011  . Leg edema 02/02/2012  . Acute renal insufficiency 02/23/2012  . Left flank pain 04/02/2012  . Chest wall contusion 07/10/2012  . Urinary frequency 07/10/2012  . Callus of foot 09/06/2012  . Right foot pain 10/09/2012  . COPD with acute exacerbation ([place]) 10/09/2012  . Right foot pain 05/20/2013  . Medicare annual wellness visit, subsequent 07/29/2013  . Candidal intertrigo 07/29/2013  . Foul  smelling urine 07/29/2013  . Nausea alone 10/30/2013  . Chest wall pain 12/29/2013  . Abnormal chest CT 02/21/2014  . Elevated blood pressure 03/27/2014  . Rash and nonspecific skin eruption 04/06/2014  . Rib pain on left side 04/21/2014  . Tick bite 04/21/2014  . Thrombocytopenia ([place]) 06/09/2014  . IGTN (ingrowing toe nail) 08/19/2014  . Yeast infection 12/15/2014  . Intertrigo 12/15/2014  . Myalgia and myositis 02/11/2015  . Chest pain 02/25/2015  . Accidental fall from chair 03/30/2015  . Abdominal pain 05/14/2015  . Melena 05/14/2015   Past Medical History:  Diagnosis Date  . Adenomatous colon polyp 08/27/2014  . Anemia   . Anxiety   . Aortic dissection ([place])   . Arthritis   . Asthma   . Bipolar affective disorder ([place])   . Blood transfusion 2000  . Blood transfusion without reported diagnosis   . Cancer ([place])   . Chronic abdominal pain   . Cirrhosis ([place])   . Colon polyp   . Depression   . Diabetes mellitus without complication ([place])   . Fatty liver 04/09/08  . GERD (gastroesophageal reflux disease)   . H/O: CVA (cardiovascular accident)   . H/O: rheumatic fever   . Hepatitis   . Hyperlipidemia   . Incontinence   . Insomnia   . Lower extremity edema   . Obstructive sleep apnea   . Platelets decreased ([place])   . RLS (restless legs syndrome)   . Shortness of breath   . Sleep apnea   . Stroke ([place])   . Ulcer   . Unspecified  disorders of nervous system    Constitutional Exam  General appearance: alert, cooperative and morbidly obese Vitals:   02/02/17 0942  BP: 104/64  Pulse: 91  Resp: 18  Temp: 98.2 F (36.8 C)  SpO2: 97%  Weight: 230 lb (104.3 kg)  Height: 5' 3"  (1.6 m)   BMI Assessment: Estimated body mass index is 40.74 kg/m as calculated from the following:   Height as of this encounter: 5' 3"  (1.6 m).   Weight as of this encounter: 230 lb (104.3 kg).  BMI interpretation table: BMI level Category Range association with higher incidence of chronic pain  <18 kg/m2 Underweight   18.5-24.9 kg/m2 Ideal body weight   25-29.9 kg/m2 Overweight Increased incidence by 20%  30-34.9 kg/m2 Obese (Class I) Increased incidence by 68%  35-39.9 kg/m2 Severe obesity (Class II) Increased incidence by 136%  >40 kg/m2 Extreme obesity (Class III) Increased incidence by 254%   BMI Readings from Last 4 Encounters:  02/02/17 40.74 kg/m  01/09/17 40.79 kg/m  12/21/16 39.86 kg/m  12/15/16 39.33 kg/m   Wt Readings from Last 4 Encounters:  02/02/17 230 lb (104.3 kg)  01/09/17 230 lb 4 oz (104.4 kg)  12/21/16 225 lb (102.1 kg)  12/15/16 222 lb (100.7 kg)  Psych/Mental status: Alert, oriented x 3 (person, place, & time)       Eyes: PERLA Respiratory: No evidence of acute respiratory distress  Cervical Spine Area Exam  Skin & Axial Inspection: No masses, redness, edema, swelling, or associated skin lesions Alignment: Symmetrical Functional ROM: Unrestricted ROM      Stability: No instability detected Muscle Tone/Strength: Functionally intact. No obvious neuro-muscular anomalies detected. Sensory (Neurological): Unimpaired Palpation: Tender              Upper Extremity (UE) Exam    Side: Right upper extremity  Side: Left upper extremity  Skin & Extremity Inspection: Skin color, temperature,  and hair growth are WNL. No peripheral edema or cyanosis. No masses, redness, swelling, asymmetry, or associated skin  lesions. No contractures.  Skin & Extremity Inspection: Skin color, temperature, and hair growth are WNL. No peripheral edema or cyanosis. No masses, redness, swelling, asymmetry, or associated skin lesions. No contractures.  Functional ROM: Unrestricted ROM          Functional ROM: Unrestricted ROM          Muscle Tone/Strength: Functionally intact. No obvious neuro-muscular anomalies detected.  Muscle Tone/Strength: Functionally intact. No obvious neuro-muscular anomalies detected.  Sensory (Neurological): Unimpaired          Sensory (Neurological): Unimpaired          Palpation: Complains of area being tender to palpation              Palpation: Complains of area being tender to palpation              Specialized Test(s): Deferred         Specialized Test(s): Deferred          Thoracic Spine Area Exam  Skin & Axial Inspection: Well healed scar from previous spine surgery detected Alignment: Asymmetric Functional ROM: Decreased ROM Stability: No instability detected Muscle Tone/Strength: Functionally intact. No obvious neuro-muscular anomalies detected. Sensory (Neurological): Movement associated pain Muscle strength & Tone: Complains of area being tender to palpation  Lumbar Spine Area Exam  Skin & Axial Inspection: Well healed scar from previous spine surgery detected Alignment: Levoscoliosis Functional ROM: Unrestricted ROM      Stability: No instability detected Muscle Tone/Strength: Functionally intact. No obvious neuro-muscular anomalies detected. Sensory (Neurological): Movement-associated pain Palpation: Complains of area being tender to palpation       Provocative Tests: Lumbar Hyperextension and rotation test: Positive       Lumbar Lateral bending test: Positive       Patrick's Maneuver: Positive                    Gait & Posture Assessment  Ambulation: Patient ambulates using a walker Gait: Limited. Using assistive device to ambulate Posture: Antalgic   Lower Extremity  Exam    Side: Right lower extremity  Side: Left lower extremity  Skin & Extremity Inspection: Skin color, temperature, and hair growth are WNL. No peripheral edema or cyanosis. No masses, redness, swelling, asymmetry, or associated skin lesions. No contractures.  Skin & Extremity Inspection: Skin color, temperature, and hair growth are WNL. No peripheral edema or cyanosis. No masses, redness, swelling, asymmetry, or associated skin lesions. No contractures.  Functional ROM: Unrestricted ROM          Functional ROM: Unrestricted ROM          Muscle Tone/Strength: Functionally intact. No obvious neuro-muscular anomalies detected.  Muscle Tone/Strength: Functionally intact. No obvious neuro-muscular anomalies detected.  Sensory (Neurological): Articular pain pattern  Sensory (Neurological): Articular pain pattern  Palpation: Tender  Palpation: Tender   Assessment  Primary Diagnosis & Pertinent Problem List: The primary encounter diagnosis was Primary osteoarthritis of both knees. Diagnoses of Spondylosis without myelopathy or radiculopathy, lumbar region, Morbid obesity ([place]), Fibromyalgia, S/P lumbar fusion, Essential hypertension, Postphlebitic syndrome with inflammation, Chronic hepatitis B virus infection ([place]), Poorly controlled type 2 diabetes mellitus with complication ([place]), and Chronic pain syndrome were also pertinent to this visit.  Visit Diagnosis (New problems to examiner): 1. Primary osteoarthritis of both knees   2. Spondylosis without myelopathy or radiculopathy, lumbar region   3. Morbid  obesity ([place])   4. Fibromyalgia   5. S/P lumbar fusion   6. Essential hypertension   7. Postphlebitic syndrome with inflammation   8. Chronic hepatitis B virus infection ([place])   9. Poorly controlled type 2 diabetes mellitus with complication ([place])   10. Chronic pain syndrome    Plan of Care (Initial workup plan)  Note: Please be advised that as per protocol, today's visit has been an evaluation  only. We have not taken over the patient's controlled substance management.  General Recommendations: The pain condition that the patient suffers from is best treated with a multidisciplinary approach that involves an increase in physical activity to prevent de-conditioning and worsening of the pain cycle, as well as psychological counseling (formal and/or informal) to address the co-morbid psychological affects of pain. Treatment will often involve judicious use of pain medications and interventional procedures to decrease the pain, allowing the patient to participate in the physical activity that will ultimately produce long-lasting pain reductions. The goal of the multidisciplinary approach is to return the patient to a higher level of overall function and to restore their ability to perform activities of daily living.  Patient is a 66 year old female with multiple medical issues including history of type I aortic dissection, bipolar effective disorder, cirrhosis secondary to [facility], diabetes, hepatitis B over 20 years ago, history of CVA, obstructive sleep apnea, morbid obesity, restless leg syndrome who presents with multiple pain complaints including neck, bilateral arms, bilateral hands, thoracolumbar spine, hips, bilateral knees, bilateral ankles. Patient does have a history of fibromyalgia as well as 5 lumbar spine surgeries from 2011 2 2015.  Patient currently resides in an assisted living facility. She is currently on oxycodone 5 mg every 4 hours as needed. Patient states that she does not get her nighttime doses of oxycodone. She is also prescribed tramadol 50 mg daily for breakthrough pain.  I had an extensive discussion with the patient about her fibromyalgia and her low back and bilateral knee pain. We also had an extensive discussion about her morbid obesity and how this could be contributing to her chronic pain state. Patient would like to be considered for chronic opioid therapy. I instructed  her that she would have to discontinue her benzodiazepines if she would like to be considered for opioid management at our clinic. I told her that her risk factors of morbid obesity, obstructive sleep apnea, concomitant benzodiazepine use places her at an increased risk of cardiorespiratory events and for that reason I would like for her to stop her benzodiazepines if she would like to be considered for opioid management here. Patient agrees  Patient states that her knees are the most painful for her and resultant difficulty ambulating. Patient has tried physical therapy in the past along with various NSAID therapy and topical's to the knee which were not very effective. She also states that she has had intra-articular knee injections which were moderately effective.  Plan: 1. Urine drug screen today. Should only be positive for oxycodone, tramadol and its metabolites. Also be positive for lorazepam.  2. I've instructed the patient to discontinue her lorazepam. She cannot take this medication if she wants to be considered for opioid therapy at our clinic  3. Start Zanaflex 4 mg twice a day when necessary muscle spasms 4. Discontinue lidocaine patches cisternal helpful. Prescription for Voltaren gel provided.  4. Schedule for bilateral genicular nerves block, diagnostic, for bilateral knee osteoarthritis.  5. Pain psych referral regarding suitability of chronic opioid therapy. My concerns  are as follows: Morbid obesity, obstructive sleep apnea, lorazepam use which the patient states she will stop. I believe the patient may benefit from low-dose opioid therapy as she has had extensive lumbar spine surgeries and has tried various medications.  6. Continue Lyrica 150 mg 3 times a day for her fibromyalgia.  Ordered Lab-work, Procedure(s), Referral(s), & Consult(s): Orders Placed This Encounter  Procedures  . GENICULAR NERVE BLOCK  . Compliance Drug Analysis, Ur  . Ambulatory referral to Psychology    Pharmacotherapy (current): Medications ordered:  Meds ordered this encounter  Medications  . tiZANidine (ZANAFLEX) 4 MG tablet    Sig: Take 1 tablet (4 mg total) by mouth 2 (two) times daily as needed for muscle spasms.    Dispense:  60 tablet    Refill:  1  . diclofenac sodium (VOLTAREN) 1 % GEL    Sig: Apply 4 g topically every 8 (eight) hours as needed (pain). Apply to right knee    Dispense:  1 Tube    Refill:  1   Medications administered during this visit: Ms. Cuello had no medications administered during this visit.   Pharmacological management options:  Opioid Analgesics: The patient was informed that there is no guarantee that she would be a candidate for opioid analgesics. The decision will be made following CDC guidelines. This decision will be based on the results of diagnostic studies, as well as Ms. Tolan's risk profile.   Membrane stabilizer: To be determined at a later timeCurrently on Lyrica 150 mg 3 times a day for fibromyalgia which she finds helpful.   Muscle relaxant: To be determined at a later timeTrial of tizanidine today   NSAID: To be determined at a later timeCan consider trial of meloxicam or diclofenac   Other analgesic(s): To be determined at a later time   Interventional management options: Ms. Talerico was informed that there is no guarantee that she would be a candidate for interventional therapies. The decision will be based on the results of diagnostic studies, as well as Ms. Ehrman's risk profile.  Procedure(s) under consideration:  -Bilateral genicular nerves block -Bilateral SI joint injections -Bilateral lumbar medial branch nerve blocks with goal radiofrequency ablation (patient has had previous spine surgeries and has had SCS electrodes in place however no IPG is present)   Provider-requested follow-up: Return for Medication Management, After Psychological evaluation, Procedure.  Future Appointments Date Time Provider [place]   03/07/2017 1:45 PM Lucilla Lame, MD [id number] None  03/14/2017 10:00 AM Algernon Huxley, MD [facility] None  03/31/2017 9:00 AM [facility]  04/10/2017 2:00 PM Lequita Asal, MD [facility] None    Primary Care Physician: Sarajane Jews, MD Location: [facility] Note by: Gillis Santa, M.D, Date: 02/02/2017; Time: 2:01 PM  Patient Instructions   It was nice meeting you today today we discussed the following:  1. Please note that if you have referred this patient for opioid management, that we will not prescribe any controlled substances until our evaluation is complete, satisfactory, and we have assumed opioid prescribing duties with an opioid agreement. As such, any controlled substances must come from the referring provider if indicated.   2. Urine drug screen today.  3. Referral to Pain Psychology regarding chronic opioid therapy.  4. Continue Lyrica 150 mg 3 times a day. Discontinue lorazepam. Start Zanaflex 4 mg twice a day when necessary muscle spasms.  5. Stop lidocaine patches. Start Voltaren gel to painful areas including low back, bilateral  knees.  6. Schedule for bilateral genicular nerve block  7. Follow with me for medication management after pain psychology evaluation. Next available for genicular nerve block.  ____________________________________________________________________________________________  Genicular Nerve Block  What is a genicular nerve block? A genicular nerve block is the injection of a local anesthetic to block the nerves that transmits pain from the knee.  What is the purpose of a facet nerve block? A genicular nerve block is a diagnostic procedure to determine if the pathologic changes (i.e. arthritis, meniscal tears, etc) and inflammation within the knee joint is the source of your knee pain. It also confirms that the knee pain will respond well to the actual treatment procedure. If a genicular nerve block  works, it will give you relief for several hours. After that, the pain is expected to return to normal. This test is always performed twice (usually a week or two apart) because two successful tests are required to move onto treatment. If both diagnostic tests are positive, then we schedule a treatment called radiofrequency (RF) ablation. In this procedure, the same nerves are cauterized, which typically leads to pain relief for 4 -18 months. If this process works well for one knee, it can be performed on the other knee if needed.  How is the procedure performed? You will be placed on the procedure table. The injection site is sterilized with either iodine or chlorhexadine. The site to be injected is numbed with a local anesthetic, and a needle is directed to the target area. X-ray guidance is used to ensure proper placement and positioning of the needle. When the needle is properly positioned near the genicular nerve, local anesthetic is injected to numb that nerve. This will be repeated at multiple sites around the knee to block all genicular nerves.  Will the procedure be painful? The injection can be painful and we therefore provide the option of receiving IV sedation. IV sedation, combined with local anesthetic, can make the injection nearly pain free. It allows you to remain very still during the procedure, which can also make the injection easier, faster, and more successful. If you decide to have IV sedation, you must have a driver to get you home safely afterwards. In addition, you cannot have anything to eat or drink within 8 hours of your appointment (clear liquids are allowed until 3 hours before the procedure). If you take medications for diabetes, these medications may need to be adjusted the morning of the procedure. Your primary care physician can help you with this adjustment.  What are the discharge instructions? If you received IV sedation do not drive or operate machinery for at least 24  hours after the procedure. You may return to work the next day following your procedure. You may resume your normal diet immediately. Do not engage in any strenuous activity for 24 hours. You should, however, engage in moderate activity that typically causes your ususal pain. If the block works, those activities should not be painful for several hours after the injection. Do not take a bath, swim, or use a hot tub for 24 hours (you may take a shower). Call the office if you have any of the following: severe pain afterwards (different than your usual symptoms), redness/swelling/discharge at the injection site(s), fevers/chills, difficulty with bowel or bladder functions.  What are the risks and side effects? The complication rate for this procedure is very low. Whenever a needle enters the skin, bleeding or infection can occur. Some other serious but extremely rare risks  include paralysis and death. You may have an allergic reaction to any of the medications used. If you have a known allergy to any medications, especially local anesthetics, notify our staff before the procedure takes place. You may experience any of the following side effects up to 4 - 6 hours after the procedure: . Leg muscle weakness or numbness may occur due to the local anesthetic affecting the nerves that control your legs (this is a temporary affect and it is not paralysis). If you have any leg weakness or numbness, walk only with assistance in order to prevent falls and injury. Your leg strength will return slowly and completely. . Dizziness may occur due to a decrease in your blood pressure. If this occurs, remain in a seated or lying position. Gradually sit up, and then stand after at least 10 minutes of sitting. . Mild headaches may occur. Drink fluids and take pain medications if needed. If the headaches persist or become severe, call the office. . Mild discomfort at the injection site can occur. This typically lasts for a few  hours but can persist for a couple days. If this occurs, take anti-inflammatories or pain medications, apply ice to the area the day of the procedure. If it persists, apply moist heat in the day(s) following.  The side effects listed above can be normal. They are not dangerous and will resolve on their own. If, however, you experience any of the following, a complication may have occurred and you should either contact your doctor. If he is not readily available, then you should proceed to the closest urgent care center for evaluation: . Severe or progressive pain at the injection site(s) . Arm or leg weakness that progressively worsens or persists for longer than 8 hours . Severe or progressive redness, swelling, or discharge from the injections site(s) . Fevers, chills, nausea, or vomiting . Bowel or bladder dysfunction (i.e. inability to urinate or pass stool or difficulty controlling either)  How long does it take for the procedure to work? You should feel relief from your usual pain within the first hour. Again, this is only expected to last for several hours, at the most. Remember, you may be sore in the middle part of your back from the needles, and you must distinguish this from your usual pain. ____________________________________________________________________________________________  GENERAL RISKS AND COMPLICATIONS  What are the risk, side effects and possible complications? Generally speaking, most procedures are safe.  However, with any procedure there are risks, side effects, and the possibility of complications.  The risks and complications are dependent upon the sites that are lesioned, or the type of nerve block to be performed.  The closer the procedure is to the spine, the more serious the risks are.  Great care is taken when placing the radio frequency needles, block needles or lesioning probes, but sometimes complications can occur. 1. Infection: Any time there is an injection  through the skin, there is a risk of infection.  This is why sterile conditions are used for these blocks.  There are four possible types of infection. 1. Localized skin infection. 2. Central Nervous System Infection-This can be in the form of Meningitis, which can be deadly. 3. Epidural Infections-This can be in the form of an epidural abscess, which can cause pressure inside of the spine, causing compression of the spinal cord with subsequent paralysis. This would require an emergency surgery to decompress, and there are no guarantees that the patient would recover from the paralysis. 4. Discitis-This  is an infection of the intervertebral discs.  It occurs in about 1% of discography procedures.  It is difficult to treat and it may lead to surgery.        2. Pain: the needles have to go through skin and soft tissues, will cause soreness.       3. Damage to internal structures:  The nerves to be lesioned may be near blood vessels or    other nerves which can be potentially damaged.       4. Bleeding: Bleeding is more common if the patient is taking blood thinners such as  aspirin, Coumadin, Ticiid, Plavix, etc., or if he/she have some genetic predisposition  such as hemophilia. Bleeding into the spinal canal can cause compression of the spinal  cord with subsequent paralysis.  This would require an emergency surgery to  decompress and there are no guarantees that the patient would recover from the  paralysis.       5. Pneumothorax:  Puncturing of a lung is a possibility, every time a needle is introduced in  the area of the chest or upper back.  Pneumothorax refers to free air around the  collapsed lung(s), inside of the thoracic cavity (chest cavity).  Another two possible  complications related to a similar event would include: Hemothorax and Chylothorax.   These are variations of the Pneumothorax, where instead of air around the collapsed  lung(s), you may have blood or chyle, respectively.        6. Spinal headaches: They may occur with any procedures in the area of the spine.       7. Persistent CSF (Cerebro-Spinal Fluid) leakage: This is a rare problem, but may occur  with prolonged intrathecal or epidural catheters either due to the formation of a fistulous  track or a dural tear.       8. Nerve damage: By working so close to the spinal cord, there is always a possibility of  nerve damage, which could be as serious as a permanent spinal cord injury with  paralysis.       9. Death:  Although rare, severe deadly allergic reactions known as "Anaphylactic  reaction" can occur to any of the medications used.      10. Worsening of the symptoms:  We can always make thing worse.  What are the chances of something like this happening? Chances of any of this occuring are extremely low.  By statistics, you have more of a chance of getting killed in a motor vehicle accident: while driving to the hospital than any of the above occurring .  Nevertheless, you should be aware that they are possibilities.  In general, it is similar to taking a shower.  Everybody knows that you can slip, hit your head and get killed.  Does that mean that you should not shower again?  Nevertheless always keep in mind that statistics do not mean anything if you happen to be on the wrong side of them.  Even if a procedure has a 1 (one) in a 1,000,000 (million) chance of going wrong, it you happen to be that one..Also, keep in mind that by statistics, you have more of a chance of having something go wrong when taking medications.  Who should not have this procedure? If you are on a blood thinning medication (e.g. Coumadin, Plavix, see list of "Blood Thinners"), or if you have an active infection going on, you should not have the procedure.  If you are taking  any blood thinners, please inform your physician.  How should I prepare for this procedure?  Do not eat or drink anything at least six hours prior to the procedure.  Bring  a driver with you .  It cannot be a taxi.  Come accompanied by an adult that can drive you back, and that is strong enough to help you if your legs get weak or numb from the local anesthetic.  Take all of your medicines the morning of the procedure with just enough water to swallow them.  If you have diabetes, make sure that you are scheduled to have your procedure done first thing in the morning, whenever possible.  If you have diabetes, take only half of your insulin dose and notify our nurse that you have done so as soon as you arrive at the clinic.  If you are diabetic, but only take blood sugar pills (oral hypoglycemic), then do not take them on the morning of your procedure.  You may take them after you have had the procedure.  Do not take aspirin or any aspirin-containing medications, at least eleven (11) days prior to the procedure.  They may prolong bleeding.  Wear loose fitting clothing that may be easy to take off and that you would not mind if it got stained with Betadine or blood.  Do not wear any jewelry or perfume  Remove any nail coloring.  It will interfere with some of our monitoring equipment.  NOTE: Remember that this is not meant to be interpreted as a complete list of all possible complications.  Unforeseen problems may occur.  BLOOD THINNERS The following drugs contain aspirin or other products, which can cause increased bleeding during surgery and should not be taken for 2 weeks prior to and 1 week after surgery.  If you should need take something for relief of minor pain, you may take acetaminophen which is found in Tylenol,m Datril, Anacin-3 and Panadol. It is not blood thinner. The products listed below are.  Do not take any of the products listed below in addition to any listed on your instruction sheet.  A.P.C or A.P.C with Codeine Codeine Phosphate Capsules #3 Ibuprofen Ridaura  ABC compound Congesprin Imuran rimadil  Advil Cope Indocin Robaxisal   Alka-Seltzer Effervescent Pain Reliever and Antacid Coricidin or Coricidin-D  Indomethacin Rufen  Alka-Seltzer plus Cold Medicine Cosprin Ketoprofen S-A-C Tablets  Anacin Analgesic Tablets or Capsules Coumadin Korlgesic Salflex  Anacin Extra Strength Analgesic tablets or capsules CP-2 Tablets Lanoril Salicylate  Anaprox Cuprimine Capsules Levenox Salocol  Anexsia-D Dalteparin Magan Salsalate  Anodynos Darvon compound Magnesium Salicylate Sine-off  Ansaid Dasin Capsules Magsal Sodium Salicylate  Anturane Depen Capsules Marnal Soma  APF Arthritis pain formula Dewitt's Pills Measurin Stanback  Argesic Dia-Gesic Meclofenamic Sulfinpyrazone  Arthritis Bayer Timed Release Aspirin Diclofenac Meclomen Sulindac  Arthritis pain formula Anacin Dicumarol Medipren Supac  Analgesic (Safety coated) Arthralgen Diffunasal Mefanamic Suprofen  Arthritis Strength Bufferin Dihydrocodeine Mepro Compound Suprol  Arthropan liquid Dopirydamole Methcarbomol with Aspirin Synalgos  ASA tablets/Enseals Disalcid Micrainin Tagament  Ascriptin Doan's Midol Talwin  Ascriptin A/D Dolene Mobidin Tanderil  Ascriptin Extra Strength Dolobid Moblgesic Ticlid  Ascriptin with Codeine Doloprin or Doloprin with Codeine Momentum Tolectin  Asperbuf Duoprin Mono-gesic Trendar  Aspergum Duradyne Motrin or Motrin IB Triminicin  Aspirin plain, buffered or enteric coated Durasal Myochrisine Trigesic  Aspirin Suppositories Easprin Nalfon Trillsate  Aspirin with Codeine Ecotrin Regular or Extra Strength Naprosyn Uracel  Atromid-S Efficin Naproxen Ursinus  Auranofin Capsules Elmiron Neocylate  Vanquish  Axotal Emagrin Norgesic Verin  Azathioprine Empirin or Empirin with Codeine Normiflo Vitamin E  Azolid Emprazil Nuprin Voltaren  Bayer Aspirin plain, buffered or children's or timed BC Tablets or powders Encaprin Orgaran Warfarin Sodium  Buff-a-Comp Enoxaparin Orudis Zorpin  Buff-a-Comp with Codeine Equegesic Os-Cal-Gesic   Buffaprin  Excedrin plain, buffered or Extra Strength Oxalid   Bufferin Arthritis Strength Feldene Oxphenbutazone   Bufferin plain or Extra Strength Feldene Capsules Oxycodone with Aspirin   Bufferin with Codeine Fenoprofen Fenoprofen Pabalate or Pabalate-SF   Buffets II Flogesic Panagesic   Buffinol plain or Extra Strength Florinal or Florinal with Codeine Panwarfarin   Buf-Tabs Flurbiprofen Penicillamine   Butalbital Compound Four-way cold tablets Penicillin   Butazolidin Fragmin Pepto-Bismol   Carbenicillin Geminisyn Percodan   Carna Arthritis Reliever Geopen Persantine   Carprofen Gold's salt Persistin   Chloramphenicol Goody's Phenylbutazone   Chloromycetin Haltrain Piroxlcam   Clmetidine heparin Plaquenil   Cllnoril Hyco-pap Ponstel   Clofibrate Hydroxy chloroquine Propoxyphen         Before stopping any of these medications, be sure to consult the physician who ordered them.  Some, such as Coumadin (Warfarin) are ordered to prevent or treat serious conditions such as "deep thrombosis", "pumonary embolisms", and other heart problems.  The amount of time that you may need off of the medication may also vary with the medication and the reason for which you were taking it.  If you are taking any of these medications, please make sure you notify your pain physician before you undergo any procedures.

## 2017-02-02 NOTE — Progress Notes (Signed)
Safety precautions to be maintained throughout the outpatient stay will include: orient to surroundings, keep bed in low position, maintain call bell within reach at all times, provide assistance with transfer out of bed and ambulation.  

## 2017-02-02 NOTE — Patient Instructions (Addendum)
It was nice meeting you today today we discussed the following:  1. Please note that if you have referred this patient for opioid management, that we will not prescribe any controlled substances until our evaluation is complete, satisfactory, and we have assumed opioid prescribing duties with an opioid agreement. As such, any controlled substances must come from the referring provider if indicated.   2. Urine drug screen today.  3. Referral to Pain Psychology regarding chronic opioid therapy.  4. Continue Lyrica 150 mg 3 times a day. Discontinue lorazepam. Start Zanaflex 4 mg twice a day when necessary muscle spasms.  5. Stop lidocaine patches. Start Voltaren gel to painful areas including low back, bilateral knees.  6. Schedule for bilateral genicular nerve block  7. Follow with me for medication management after pain psychology evaluation. Next available for genicular nerve block.  ____________________________________________________________________________________________  Genicular Nerve Block  What is a genicular nerve block? A genicular nerve block is the injection of a local anesthetic to block the nerves that transmits pain from the knee.  What is the purpose of a facet nerve block? A genicular nerve block is a diagnostic procedure to determine if the pathologic changes (i.e. arthritis, meniscal tears, etc) and inflammation within the knee joint is the source of your knee pain. It also confirms that the knee pain will respond well to the actual treatment procedure. If a genicular nerve block works, it will give you relief for several hours. After that, the pain is expected to return to normal. This test is always performed twice (usually a week or two apart) because two successful tests are required to move onto treatment. If both diagnostic tests are positive, then we schedule a treatment called radiofrequency (RF) ablation. In this procedure, the same nerves are cauterized, which  typically leads to pain relief for 4 -18 months. If this process works well for one knee, it can be performed on the other knee if needed.  How is the procedure performed? You will be placed on the procedure table. The injection site is sterilized with either iodine or chlorhexadine. The site to be injected is numbed with a local anesthetic, and a needle is directed to the target area. X-ray guidance is used to ensure proper placement and positioning of the needle. When the needle is properly positioned near the genicular nerve, local anesthetic is injected to numb that nerve. This will be repeated at multiple sites around the knee to block all genicular nerves.  Will the procedure be painful? The injection can be painful and we therefore provide the option of receiving IV sedation. IV sedation, combined with local anesthetic, can make the injection nearly pain free. It allows you to remain very still during the procedure, which can also make the injection easier, faster, and more successful. If you decide to have IV sedation, you must have a driver to get you home safely afterwards. In addition, you cannot have anything to eat or drink within 8 hours of your appointment (clear liquids are allowed until 3 hours before the procedure). If you take medications for diabetes, these medications may need to be adjusted the morning of the procedure. Your primary care physician can help you with this adjustment.  What are the discharge instructions? If you received IV sedation do not drive or operate machinery for at least 24 hours after the procedure. You may return to work the next day following your procedure. You may resume your normal diet immediately. Do not engage in any strenuous  activity for 24 hours. You should, however, engage in moderate activity that typically causes your ususal pain. If the block works, those activities should not be painful for several hours after the injection. Do not take a bath,  swim, or use a hot tub for 24 hours (you may take a shower). Call the office if you have any of the following: severe pain afterwards (different than your usual symptoms), redness/swelling/discharge at the injection site(s), fevers/chills, difficulty with bowel or bladder functions.  What are the risks and side effects? The complication rate for this procedure is very low. Whenever a needle enters the skin, bleeding or infection can occur. Some other serious but extremely rare risks include paralysis and death. You may have an allergic reaction to any of the medications used. If you have a known allergy to any medications, especially local anesthetics, notify our staff before the procedure takes place. You may experience any of the following side effects up to 4 - 6 hours after the procedure: . Leg muscle weakness or numbness may occur due to the local anesthetic affecting the nerves that control your legs (this is a temporary affect and it is not paralysis). If you have any leg weakness or numbness, walk only with assistance in order to prevent falls and injury. Your leg strength will return slowly and completely. . Dizziness may occur due to a decrease in your blood pressure. If this occurs, remain in a seated or lying position. Gradually sit up, and then stand after at least 10 minutes of sitting. . Mild headaches may occur. Drink fluids and take pain medications if needed. If the headaches persist or become severe, call the office. . Mild discomfort at the injection site can occur. This typically lasts for a few hours but can persist for a couple days. If this occurs, take anti-inflammatories or pain medications, apply ice to the area the day of the procedure. If it persists, apply moist heat in the day(s) following.  The side effects listed above can be normal. They are not dangerous and will resolve on their own. If, however, you experience any of the following, a complication may have occurred and  you should either contact your doctor. If he is not readily available, then you should proceed to the closest urgent care center for evaluation: . Severe or progressive pain at the injection site(s) . Arm or leg weakness that progressively worsens or persists for longer than 8 hours . Severe or progressive redness, swelling, or discharge from the injections site(s) . Fevers, chills, nausea, or vomiting . Bowel or bladder dysfunction (i.e. inability to urinate or pass stool or difficulty controlling either)  How long does it take for the procedure to work? You should feel relief from your usual pain within the first hour. Again, this is only expected to last for several hours, at the most. Remember, you may be sore in the middle part of your back from the needles, and you must distinguish this from your usual pain. ____________________________________________________________________________________________  GENERAL RISKS AND COMPLICATIONS  What are the risk, side effects and possible complications? Generally speaking, most procedures are safe.  However, with any procedure there are risks, side effects, and the possibility of complications.  The risks and complications are dependent upon the sites that are lesioned, or the type of nerve block to be performed.  The closer the procedure is to the spine, the more serious the risks are.  Great care is taken when placing the radio frequency needles, block  needles or lesioning probes, but sometimes complications can occur. 1. Infection: Any time there is an injection through the skin, there is a risk of infection.  This is why sterile conditions are used for these blocks.  There are four possible types of infection. 1. Localized skin infection. 2. Central Nervous System Infection-This can be in the form of Meningitis, which can be deadly. 3. Epidural Infections-This can be in the form of an epidural abscess, which can cause pressure inside of the spine,  causing compression of the spinal cord with subsequent paralysis. This would require an emergency surgery to decompress, and there are no guarantees that the patient would recover from the paralysis. 4. Discitis-This is an infection of the intervertebral discs.  It occurs in about 1% of discography procedures.  It is difficult to treat and it may lead to surgery.        2. Pain: the needles have to go through skin and soft tissues, will cause soreness.       3. Damage to internal structures:  The nerves to be lesioned may be near blood vessels or    other nerves which can be potentially damaged.       4. Bleeding: Bleeding is more common if the patient is taking blood thinners such as  aspirin, Coumadin, Ticiid, Plavix, etc., or if he/she have some genetic predisposition  such as hemophilia. Bleeding into the spinal canal can cause compression of the spinal  cord with subsequent paralysis.  This would require an emergency surgery to  decompress and there are no guarantees that the patient would recover from the  paralysis.       5. Pneumothorax:  Puncturing of a lung is a possibility, every time a needle is introduced in  the area of the chest or upper back.  Pneumothorax refers to free air around the  collapsed lung(s), inside of the thoracic cavity (chest cavity).  Another two possible  complications related to a similar event would include: Hemothorax and Chylothorax.   These are variations of the Pneumothorax, where instead of air around the collapsed  lung(s), you may have blood or chyle, respectively.       6. Spinal headaches: They may occur with any procedures in the area of the spine.       7. Persistent CSF (Cerebro-Spinal Fluid) leakage: This is a rare problem, but may occur  with prolonged intrathecal or epidural catheters either due to the formation of a fistulous  track or a dural tear.       8. Nerve damage: By working so close to the spinal cord, there is always a possibility of  nerve  damage, which could be as serious as a permanent spinal cord injury with  paralysis.       9. Death:  Although rare, severe deadly allergic reactions known as "Anaphylactic  reaction" can occur to any of the medications used.      10. Worsening of the symptoms:  We can always make thing worse.  What are the chances of something like this happening? Chances of any of this occuring are extremely low.  By statistics, you have more of a chance of getting killed in a motor vehicle accident: while driving to the hospital than any of the above occurring .  Nevertheless, you should be aware that they are possibilities.  In general, it is similar to taking a shower.  Everybody knows that you can slip, hit your head and get killed.  Does  that mean that you should not shower again?  Nevertheless always keep in mind that statistics do not mean anything if you happen to be on the wrong side of them.  Even if a procedure has a 1 (one) in a 1,000,000 (million) chance of going wrong, it you happen to be that one..Also, keep in mind that by statistics, you have more of a chance of having something go wrong when taking medications.  Who should not have this procedure? If you are on a blood thinning medication (e.g. Coumadin, Plavix, see list of "Blood Thinners"), or if you have an active infection going on, you should not have the procedure.  If you are taking any blood thinners, please inform your physician.  How should I prepare for this procedure?  Do not eat or drink anything at least six hours prior to the procedure.  Bring a driver with you .  It cannot be a taxi.  Come accompanied by an adult that can drive you back, and that is strong enough to help you if your legs get weak or numb from the local anesthetic.  Take all of your medicines the morning of the procedure with just enough water to swallow them.  If you have diabetes, make sure that you are scheduled to have your procedure done first thing in the  morning, whenever possible.  If you have diabetes, take only half of your insulin dose and notify our nurse that you have done so as soon as you arrive at the clinic.  If you are diabetic, but only take blood sugar pills (oral hypoglycemic), then do not take them on the morning of your procedure.  You may take them after you have had the procedure.  Do not take aspirin or any aspirin-containing medications, at least eleven (11) days prior to the procedure.  They may prolong bleeding.  Wear loose fitting clothing that may be easy to take off and that you would not mind if it got stained with Betadine or blood.  Do not wear any jewelry or perfume  Remove any nail coloring.  It will interfere with some of our monitoring equipment.  NOTE: Remember that this is not meant to be interpreted as a complete list of all possible complications.  Unforeseen problems may occur.  BLOOD THINNERS The following drugs contain aspirin or other products, which can cause increased bleeding during surgery and should not be taken for 2 weeks prior to and 1 week after surgery.  If you should need take something for relief of minor pain, you may take acetaminophen which is found in Tylenol,m Datril, Anacin-3 and Panadol. It is not blood thinner. The products listed below are.  Do not take any of the products listed below in addition to any listed on your instruction sheet.  A.P.C or A.P.C with Codeine Codeine Phosphate Capsules #3 Ibuprofen Ridaura  ABC compound Congesprin Imuran rimadil  Advil Cope Indocin Robaxisal  Alka-Seltzer Effervescent Pain Reliever and Antacid Coricidin or Coricidin-D  Indomethacin Rufen  Alka-Seltzer plus Cold Medicine Cosprin Ketoprofen S-A-C Tablets  Anacin Analgesic Tablets or Capsules Coumadin Korlgesic Salflex  Anacin Extra Strength Analgesic tablets or capsules CP-2 Tablets Lanoril Salicylate  Anaprox Cuprimine Capsules Levenox Salocol  Anexsia-D Dalteparin Magan Salsalate   Anodynos Darvon compound Magnesium Salicylate Sine-off  Ansaid Dasin Capsules Magsal Sodium Salicylate  Anturane Depen Capsules Marnal Soma  APF Arthritis pain formula Dewitt's Pills Measurin Stanback  Argesic Dia-Gesic Meclofenamic Sulfinpyrazone  Arthritis Bayer Timed Release Aspirin Diclofenac Meclomen  Sulindac  Arthritis pain formula Anacin Dicumarol Medipren Supac  Analgesic (Safety coated) Arthralgen Diffunasal Mefanamic Suprofen  Arthritis Strength Bufferin Dihydrocodeine Mepro Compound Suprol  Arthropan liquid Dopirydamole Methcarbomol with Aspirin Synalgos  ASA tablets/Enseals Disalcid Micrainin Tagament  Ascriptin Doan's Midol Talwin  Ascriptin A/D Dolene Mobidin Tanderil  Ascriptin Extra Strength Dolobid Moblgesic Ticlid  Ascriptin with Codeine Doloprin or Doloprin with Codeine Momentum Tolectin  Asperbuf Duoprin Mono-gesic Trendar  Aspergum Duradyne Motrin or Motrin IB Triminicin  Aspirin plain, buffered or enteric coated Durasal Myochrisine Trigesic  Aspirin Suppositories Easprin Nalfon Trillsate  Aspirin with Codeine Ecotrin Regular or Extra Strength Naprosyn Uracel  Atromid-S Efficin Naproxen Ursinus  Auranofin Capsules Elmiron Neocylate Vanquish  Axotal Emagrin Norgesic Verin  Azathioprine Empirin or Empirin with Codeine Normiflo Vitamin E  Azolid Emprazil Nuprin Voltaren  Bayer Aspirin plain, buffered or children's or timed BC Tablets or powders Encaprin Orgaran Warfarin Sodium  Buff-a-Comp Enoxaparin Orudis Zorpin  Buff-a-Comp with Codeine Equegesic Os-Cal-Gesic   Buffaprin Excedrin plain, buffered or Extra Strength Oxalid   Bufferin Arthritis Strength Feldene Oxphenbutazone   Bufferin plain or Extra Strength Feldene Capsules Oxycodone with Aspirin   Bufferin with Codeine Fenoprofen Fenoprofen Pabalate or Pabalate-SF   Buffets II Flogesic Panagesic   Buffinol plain or Extra Strength Florinal or Florinal with Codeine Panwarfarin   Buf-Tabs Flurbiprofen  Penicillamine   Butalbital Compound Four-way cold tablets Penicillin   Butazolidin Fragmin Pepto-Bismol   Carbenicillin Geminisyn Percodan   Carna Arthritis Reliever Geopen Persantine   Carprofen Gold's salt Persistin   Chloramphenicol Goody's Phenylbutazone   Chloromycetin Haltrain Piroxlcam   Clmetidine heparin Plaquenil   Cllnoril Hyco-pap Ponstel   Clofibrate Hydroxy chloroquine Propoxyphen         Before stopping any of these medications, be sure to consult the physician who ordered them.  Some, such as Coumadin (Warfarin) are ordered to prevent or treat serious conditions such as "deep thrombosis", "pumonary embolisms", and other heart problems.  The amount of time that you may need off of the medication may also vary with the medication and the reason for which you were taking it.  If you are taking any of these medications, please make sure you notify your pain physician before you undergo any procedures.

## 2017-02-08 LAB — COMPLIANCE DRUG ANALYSIS, UR

## 2017-02-15 ENCOUNTER — Emergency Department: Payer: Medicare Other

## 2017-02-15 ENCOUNTER — Encounter: Payer: Self-pay | Admitting: Emergency Medicine

## 2017-02-15 ENCOUNTER — Emergency Department
Admission: EM | Admit: 2017-02-15 | Discharge: 2017-02-15 | Disposition: A | Discharge status: Home / Self Care | Payer: Medicare Other | Attending: Emergency Medicine | Admitting: Emergency Medicine

## 2017-02-15 DIAGNOSIS — Z87891 Personal history of nicotine dependence: Secondary | ICD-10-CM | POA: Insufficient documentation

## 2017-02-15 DIAGNOSIS — M23304 Other meniscus derangements, unspecified medial meniscus, left knee: Secondary | ICD-10-CM | POA: Diagnosis not present

## 2017-02-15 DIAGNOSIS — I10 Essential (primary) hypertension: Secondary | ICD-10-CM | POA: Diagnosis not present

## 2017-02-15 DIAGNOSIS — Z859 Personal history of malignant neoplasm, unspecified: Secondary | ICD-10-CM | POA: Insufficient documentation

## 2017-02-15 DIAGNOSIS — Z79899 Other long term (current) drug therapy: Secondary | ICD-10-CM | POA: Diagnosis not present

## 2017-02-15 DIAGNOSIS — M25562 Pain in left knee: Secondary | ICD-10-CM | POA: Diagnosis present

## 2017-02-15 DIAGNOSIS — J45909 Unspecified asthma, uncomplicated: Secondary | ICD-10-CM | POA: Diagnosis not present

## 2017-02-15 DIAGNOSIS — Z794 Long term (current) use of insulin: Secondary | ICD-10-CM | POA: Insufficient documentation

## 2017-02-15 DIAGNOSIS — E119 Type 2 diabetes mellitus without complications: Secondary | ICD-10-CM | POA: Insufficient documentation

## 2017-02-15 MED ORDER — OXYCODONE-ACETAMINOPHEN 5-325 MG PO TABS: 2.0000 | ORAL_TABLET | Freq: Once | ORAL | Status: DC

## 2017-02-15 MED ORDER — OXYCODONE HCL 5 MG PO TABS
5.0000 mg | ORAL_TABLET | Freq: Once | ORAL | Status: AC
  Administered 2017-02-15: 5 mg via ORAL
  Filled 2017-02-15: qty 1

## 2017-02-15 NOTE — ED Triage Notes (Signed)
Pt to triage via w/c with no distress noted; st having left knee pain with no known injury; st pain just began after lunch time today; pt denies any hx of same

## 2017-02-15 NOTE — ED Notes (Addendum)
Pt states getting out of car and sudden onset pain to left knee-denies injury, painful with movement, unable to bear weight. Reporting numbness to lower leg. Pt denies hx same or other injury to same

## 2017-02-15 NOTE — ED Triage Notes (Signed)
Pt to Triage by EMS with reports of non traumatic left knee pain that is chronic in nature.

## 2017-02-15 NOTE — ED Notes (Signed)
Pt. Verbalizes understanding of d/c instructions and follow-up and also with Lajoyce Lauber, at [facility]. VS stable and pain controlled per pt.  Pt. In NAD at time of d/c and denies further concerns regarding this visit. Pt. Stable at the time of departure from the unit, departing unit by the safest and most appropriate manner per that pt condition and limitations. Pt advised to return to the ED at any time for emergent concerns, or for new/worsening symptoms. Pt transported by [facility].

## 2017-02-15 NOTE — ED Provider Notes (Signed)
[facility] Emergency Department Provider Note  ____________________________________________  Time seen: Approximately 9:33 PM  I have reviewed the triage vital signs and the nursing notes.   HISTORY  Chief Complaint Knee Pain    HPI Stephanie [name] is a 66 y.o. female who presents emergency department complaining of sharp left knee pain. Patient reports that she got out of the [name] today and possibly twisted wrong on her left knee. Patient has a history of meniscal tears with surgical intervention to the right knee. She reports that this knee is doing well and she has completed physical therapy. No complaints of the right knee. Patient reports that she is having sharp pain to the medial aspect of the left knee. She is unable to bear weight on same. She denies any other injury or complaint.   Past Medical History:  Diagnosis Date  . Adenomatous colon polyp 08/27/2014   Polyps x 3  . Anemia   . Anxiety   . 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 Arthritis   . Asthma   . Bipolar affective disorder ([place])    h/o  . Blood transfusion 2000  . Blood transfusion without reported diagnosis   . Cancer ([place])   . Chronic abdominal pain    abdominal wall pain  . 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 Colon polyp   . Depression   . Diabetes mellitus without complication ([place])   . Fatty liver 04/09/08   found in abd CT  . GERD (gastroesophageal reflux disease)   . H/O: CVA (cardiovascular accident)    TIA  . H/O: rheumatic fever   . Hepatitis    HEP "B" twenty years ago  . Hyperlipidemia   . Incontinence   . Insomnia   . Lower extremity edema   . Obstructive sleep apnea    Use C-PAP twice per week  . Platelets decreased ([place])   . RLS (restless legs syndrome)   . Shortness of breath   . Sleep apnea   . Stroke [facility])    TIA- 2002  . Ulcer   . Unspecified disorders of nervous system     Patient Active Problem List   Diagnosis Date Noted  .  Encephalopathy 07/19/2016  . Lymphedema 06/14/2016  . Pain in limb 05/06/2016  . Swelling of limb 05/06/2016  . Postphlebitic syndrome with inflammation 05/06/2016  . Pneumonia 04/18/2016  . Chest pain 04/04/2016  . Iron deficiency anemia 01/30/2016  . Hypoglycemia 09/09/2015  . Hyperglycemia 07/16/2015  . Neuropathy 06/08/2015  . Thrombocytopenia ([place]) 08/19/2014  . Abdominal pain, chronic, epigastric 06/12/2014  . Liver cirrhosis secondary to NASH ([place]) 02/27/2014  . Dizziness and giddiness 10/30/2013  . DOE (dyspnea on exertion) 10/30/2013  . Poorly controlled type 2 diabetes mellitus with complication ([place]) 81/06/7508  . DNR (do not resuscitate) 07/29/2013  . Encounter for routine gynecological examination 07/29/2013  . Dyspnea 07/10/2013  . Lung nodule 07/10/2013  . Varicose veins of left lower extremity 10/09/2012  . GERD (gastroesophageal reflux disease) 10/09/2012  . Cirrhosis of liver ([place]) 02/02/2012  . Anxiety 07/11/2011  . Aortic dissection ([place])   . BACK PAIN, LUMBAR, CHRONIC 11/19/2009  . UNS ADVRS EFF OTH RX MEDICINAL&BIOLOGICAL SBSTNC 12/16/2008  . UNSPECIFIED VITAMIN D DEFICIENCY 09/26/2008  . TOBACCO ABUSE 06/19/2008  . RESTLESS LEGS SYNDROME 04/27/2007  . INSOMNIA 04/27/2007  . HLD (hyperlipidemia) 03/08/2007  . OSA (obstructive sleep apnea) 03/08/2007  . Essential hypertension 03/08/2007  .  Asthma 03/08/2007  . BIPOLAR AFFECTIVE DISORDER, HX OF 03/08/2007    Past Surgical History:  Procedure Laterality Date  . ABDOMINAL EXPLORATION SURGERY    . ABDOMINAL HYSTERECTOMY     total  . Axillary artery cannulation via 8-mm Hemashield graft, median sternotomy, extracorporeal circulation with deep hypothermic circulatory arrest, repair of  aortic dessection  01/23/2009   Dr Arlyce Dice  . BACK SURGERY      x 5  . cataract     both eyes  . CHOLECYSTECTOMY    . COLONOSCOPY    . EYE SURGERY Bilateral    Cataract Extraction with IOL  . HEMORROIDECTOMY     and  colon polyp removed  . KNEE ARTHROSCOPY WITH LATERAL RELEASE Right 11/15/2016   Procedure: KNEE ARTHROSCOPY WITH LATERAL RELEASE;  Surgeon: Corky Mull, MD;  Location: [facility] ORS;  Service: Orthopedics;  Laterality: Right;  . LIVER BIOPSY  04/10/2012   Procedure: LIVER BIOPSY;  Surgeon: Inda Castle, MD;  Location: [facility];  Service: Endoscopy;  Laterality: N/A;  ultrasound to mark order for abd limited/liver to be marked to be sent by [email or web address]   . LUMBAR FUSION  10/09  . LUMBAR WOUND DEBRIDEMENT  05/09/2011   Procedure: LUMBAR WOUND DEBRIDEMENT;  Surgeon: Dahlia Bailiff;  Location: [facility];  Service: Orthopedics;  Laterality: N/A;  IRRIGATION AND DEBRIDEMENT SPINAL WOUND  . POSTERIOR CERVICAL FUSION/FORAMINOTOMY    . ROTATOR CUFF REPAIR     left    Prior to Admission medications   Medication Sig Start Date End Date Taking? Authorizing Provider  acetaminophen (TYLENOL) 325 MG tablet Take 650 mg by mouth every 4 (four) hours as needed (for fever, minor discomfort, or headache).     [provider]  albuterol (PROVENTIL HFA;VENTOLIN HFA) 108 (90 Base) MCG/ACT inhaler Inhale 2 puffs into the lungs every 6 (six) hours as needed for wheezing or shortness of breath. 07/28/16   Alfred Levins, [place], MD  albuterol (PROVENTIL) (2.5 MG/3ML) 0.083% nebulizer solution Take 2.5 mg by nebulization every 6 (six) hours as needed for wheezing or shortness of breath.    [provider]  Alum & Mag Hydroxide-Simeth (ALMACONE PO) Take 30 mLs by mouth every 6 (six) hours as needed.    [provider]  barrier cream (NON-SPECIFIED) CREA Apply 1 application topically as needed (APPLIED AFTER TOILETING TO PREVENT SKIN BREAKDOWN).    [provider]  budesonide-formoterol (SYMBICORT) 160-4.5 MCG/ACT inhaler Inhale 2 puffs into the lungs 2 (two) times daily. (0800 & 2000)    [provider]  buPROPion (WELLBUTRIN SR) 150 MG 12 hr tablet Take 150 mg by mouth daily. (0800)     [provider]  clonazePAM (KLONOPIN) 0.25 MG disintegrating tablet 0.25 mg 3 (three) times daily.  01/12/17   [provider]  clonazePAM (KLONOPIN) 0.5 MG tablet Take 0.25 mg by mouth 3 (three) times daily. (0800, 1300 & 2000)    [provider]  diclofenac sodium (VOLTAREN) 1 % GEL Apply 4 g topically every 8 (eight) hours as needed (pain). Apply to right knee Patient not taking: Reported on 02/02/2017 02/02/17   Gillis Santa, MD  dicyclomine (BENTYL) 10 MG capsule Take 10 mg by mouth 3 (three) times daily. (0800, 1300 & 2000)    [provider]  diphenhydrAMINE (DIPHENHIST) 25 mg capsule Take 25 mg by mouth every 6 (six) hours as needed (for allergic reaction (notify provider is symptoms persist more than 24 hrs*).    [provider]  Emollient (EUCERIN INTENSIVE REPAIR) LOTN Apply 1 application topically 2 (two) times daily.    [provider]  ferrous sulfate 325 (65 FE) MG tablet Take 325 mg by mouth 2 (two) times daily. (0800 & 2000)    [provider]  furosemide (LASIX) 80 MG tablet Take 80 mg by mouth daily. (0800)    [provider]  gabapentin (NEURONTIN) 600 MG tablet Take 600 mg by mouth 2 (two) times daily. (0800 & 2000)    [provider]  guaifenesin (ROBITUSSIN) 100 MG/5ML syrup Take 300 mg by mouth every 6 (six) hours as needed for cough.     [provider]  insulin glargine (LANTUS) 100 UNIT/ML injection Inject 28 Units into the skin at bedtime.    [provider]  insulin lispro (HUMALOG KWIKPEN) 100 UNIT/ML KiwkPen Inject 0-9 Units into the skin 3 (three) times daily. Check FSBS before each meal & cover with SSI (sliding scale insulin)    [provider]  lactulose (CHRONULAC) 10 GM/15ML solution Take 10 g by mouth 3 (three) times a week. 10/17/16   [provider]  lamoTRIgine (LAMICTAL) 100 MG tablet Take 100 mg by mouth 2 (two) times daily. (0800 & 2000)     [provider]  lidocaine (LIDODERM) 5 % Place 3 patches onto the skin daily.  02/01/17   [provider]  loperamide (IMODIUM) 2 MG capsule Take 4 mg by mouth as needed for diarrhea or loose stools. *Do not exceed 8 doses in 24 hours*    [provider]  LORazepam (ATIVAN) 0.5 MG tablet Take 0.5 mg by mouth every 8 (eight) hours.    [provider]  LYRICA 150 MG capsule Take 150 mg by mouth 3 (three) times daily. 11/29/16   [provider]  magnesium hydroxide (MILK OF MAGNESIA) 400 MG/5ML suspension Take 30 mLs by mouth daily as needed (for constipation).    [provider]  Menthol-Zinc Oxide (GOLD BOND EX) Apply 1 application topically daily.    [provider]  metoCLOPramide (REGLAN) 5 MG tablet Take 5 mg by mouth 4 (four) times daily -  before meals and at bedtime. (0800, 1200, 1600 & 2000)    [provider]  mirtazapine (REMERON) 15 MG tablet Take 45 mg by mouth at bedtime. (2000)     [provider]  omeprazole (PRILOSEC) 20 MG capsule Take 20 mg by mouth daily before breakfast. (0630)    [provider]  ondansetron (ZOFRAN) 4 MG tablet Take 4 mg by mouth every 4 (four) hours as needed for nausea or vomiting.    [provider]  oxybutynin (DITROPAN-XL) 5 MG 24 hr tablet Take 5 mg by mouth daily. (0800)    [provider]  oxyCODONE (ROXICODONE) 5 MG immediate release tablet Take 1 tablet (5 mg total) by mouth every 4 (four) hours as needed for severe pain. 11/15/16   Poggi, Marshall Cork, MD  potassium chloride SA (K-DUR,KLOR-CON) 20 MEQ tablet Take 20 mEq by mouth daily. (0800)    [provider]  pyrithione zinc (HEAD AND SHOULDERS) 1 % shampoo Apply 1 application topically daily. t-gel or head and shoulders.    [provider]  rOPINIRole (REQUIP) 3 MG tablet Take 3 mg by mouth at bedtime. (2000)    [provider]  simvastatin (ZOCOR) 10 MG tablet Take 10 mg  by mouth at bedtime. (2000)    [provider]  spironolactone (ALDACTONE) 25 MG tablet  Take 25 mg by mouth daily. (0800)    [provider]  tiZANidine (ZANAFLEX) 4 MG tablet Take 1 tablet (4 mg total) by mouth 2 (two) times daily as needed for muscle spasms. Patient not taking: Reported on 02/02/2017 02/02/17   Gillis Santa, MD  traMADol (ULTRAM) 50 MG tablet Take 50 mg by mouth 2 (two) times daily. (0800 & 2000)    [provider]  vitamin C (ASCORBIC ACID) 500 MG tablet Take 500 mg by mouth 2 (two) times daily. (0800 & 2000)    [provider]  Water For Irrigation, Sterile ([place]) Irrigate with as directed at bedtime. Use with CPAP machine at bedtime during sleep & remove in the morning    [provider]  Xylitol (XYLIMELTS) 500 MG DISK Use as directed 1 capsule in the mouth or throat 3 (three) times daily.    [provider]  zolpidem (AMBIEN) 5 MG tablet Take 5 mg by mouth at bedtime. (2000)    [provider]    Allergies Doxycycline  Family History  Problem Relation Age of Onset  . Breast cancer Mother   . Lung cancer Mother   . Stomach cancer Father   . Diabetes Unknown        4 aunts, and 1 uncle  . Colon cancer Neg Hx   . Esophageal cancer Neg Hx   . Rectal cancer Neg Hx     Social History Social History  Substance Use Topics  . Smoking status: Former Smoker    Packs/day: 1.50    Years: 50.00    Types: Cigarettes    Quit date: 04/06/2013  . Smokeless tobacco: Never Used  . Alcohol use No     Review of Systems  Constitutional: No fever/chills Cardiovascular: no chest pain. Respiratory: no cough. No SOB. Musculoskeletal: Positive for sharp left knee pain Skin: Negative for rash, abrasions, lacerations, ecchymosis. Neurological: Negative for headaches, focal weakness or numbness. 10-point ROS otherwise negative.  ____________________________________________   PHYSICAL  EXAM:  VITAL SIGNS: ED Triage Vitals [02/15/17 1906]  Enc Vitals Group     BP 112/68     Pulse Rate 80     Resp 18     Temp 98 F (36.7 C)     Temp Source Oral     SpO2 95 %     Weight 230 lb (104.3 kg)     Height 5' 3"  (1.6 m)     Head Circumference      Peak Flow      Pain Score 10     Pain Loc      Pain Edu?      Excl. in [place]?      Constitutional: Alert and oriented. Well appearing and in no acute distress. Eyes: Conjunctivae are normal. PERRL. EOMI. Head: Atraumatic. Neck: No stridor.   Cardiovascular: Normal rate, regular rhythm. Normal S1 and S2.  Good peripheral circulation. Respiratory: Normal respiratory effort without tachypnea or retractions. Lungs CTAB. Good air entry to the bases with no decreased or absent breath sounds. Musculoskeletal: Limited range of motion to the left lower extremity due to pain. Patient has mild edema to the medial aspect of the left knee. No gross deformity. Patient is very tender to palpation along the medial joint line and medial aspect of the knee. No tenderness to palpation. No palpable abnormalities. Varus, valgus, Lachman's is negative. McMurray's is positive for medial meniscus. Neurologic:  Normal speech and language. No gross focal  neurologic deficits are appreciated.  Skin:  Skin is warm, dry and intact. No rash noted. Psychiatric: Mood and affect are normal. Speech and behavior are normal. Patient exhibits appropriate insight and judgement.   ____________________________________________   LABS (all labs ordered are listed, but only abnormal results are displayed)  Labs Reviewed - No data to display ____________________________________________  EKG   ____________________________________________  RADIOLOGY Diam[facility] Kunta Hilleary, personally viewed and evaluated these images (plain radiographs) as part of my medical decision making, as well as reviewing the written report by the radiologist.  Dg Knee 2 Views  Left  Result Date: 02/15/2017 CLINICAL DATA:  Left knee pain.  No known injury. EXAM: LEFT KNEE - 1-2 VIEW COMPARISON:  01/31/2016 FINDINGS: No evidence of fracture, dislocation, or joint effusion. No evidence of arthropathy or other focal bone abnormality. Soft tissues are unremarkable. IMPRESSION: Negative. Electronically Signed   By: Rolm Baptise M.D.   On: 02/15/2017 19:42    ____________________________________________    PROCEDURES  Procedure(s) performed:    Procedures    Medications  oxyCODONE (Oxy IR/ROXICODONE) immediate release tablet 5 mg (5 mg Oral Given 02/15/17 2156)     ____________________________________________   INITIAL IMPRESSION / ASSESSMENT AND PLAN / ED COURSE  Pertinent labs & imaging results that were available during my care of the patient were reviewed by me and considered in my medical decision making (see chart for details).  Review of the [place] CSRS was performed in accordance of the [place] prior to dispensing any controlled drugs.     Patient's diagnosis is consistent with left medial meniscal arrangement. X-ray reveals no acute osseous abnormality. Exam is concerning for medial meniscal derangement.. Patient lives in a skilled nursing facility and has pain medicines ordered when necessary. She may take these medicines as needed. She will follow up with orthopedics for further management of this condition. Patient is given ED precautions to return to the ED for any worsening or new symptoms.     ____________________________________________  FINAL CLINICAL IMPRESSION(S) / ED DIAGNOSES  Final diagnoses:  Derangement of medial meniscus of left knee      NEW MEDICATIONS STARTED DURING THIS VISIT:  Discharge Medication List as of 02/15/2017 10:33 PM          This chart was dictated using voice recognition software/Dragon. Despite best efforts to proofread, errors can occur which can change the meaning. Any change was purely  unintentional.    Darletta Moll, PA-C 02/16/17 0056    Darel Hong, MD 02/17/17 1432

## 2017-02-16 IMAGING — CT CT CHEST W/O CM
2 of 4 series · 15 of 36 positions shown, 18 images · non-contrast
Comparison: Multiple prior chest CTs. The most recent is 04/04/2016

CLINICAL DATA: Followup pulmonary nodules.

EXAM:
CT CHEST WITHOUT CONTRAST
TECHNIQUE: Multidetector CT imaging of the chest was performed following the
standard protocol without IV contrast.

[Series 2: chest w/(date) · axial · 0.70mm/px · z∈[-98,+140]mm · 12 of 139 slices shown, 15 images]
[im 10/139  mediastinal]
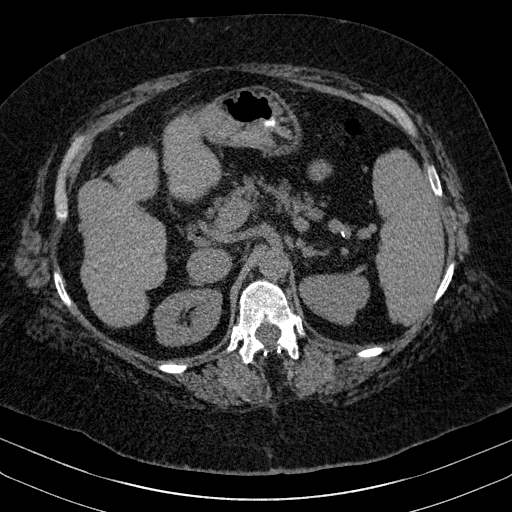
[im 10/139  lung]
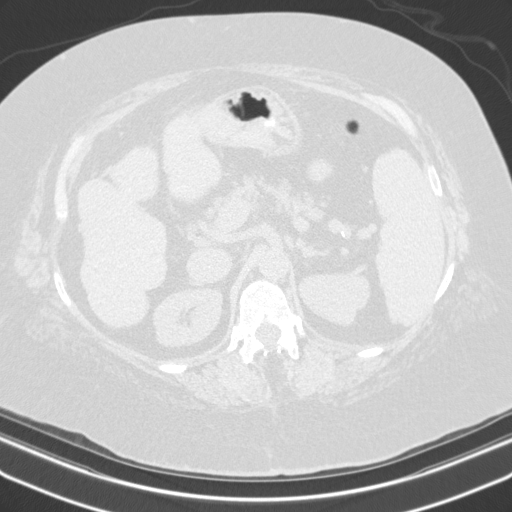
[im 20/139  lung]
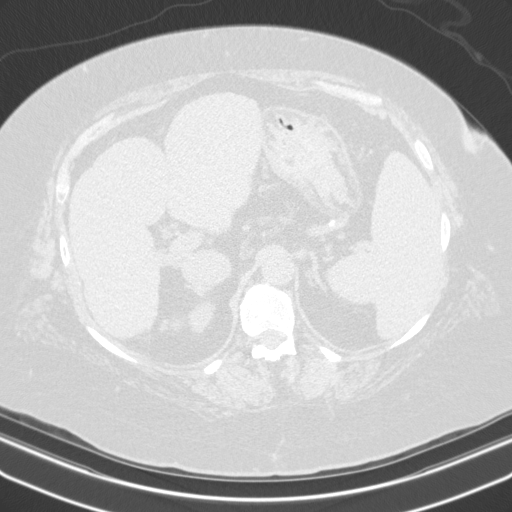
[im 30/139  lung]
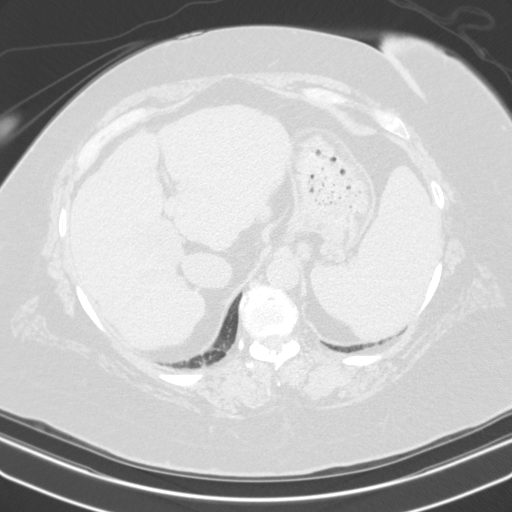
[im 40/139  lung]
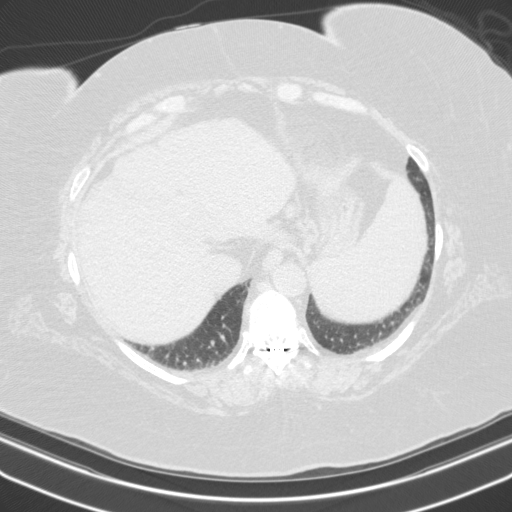
[im 50/139  mediastinal]
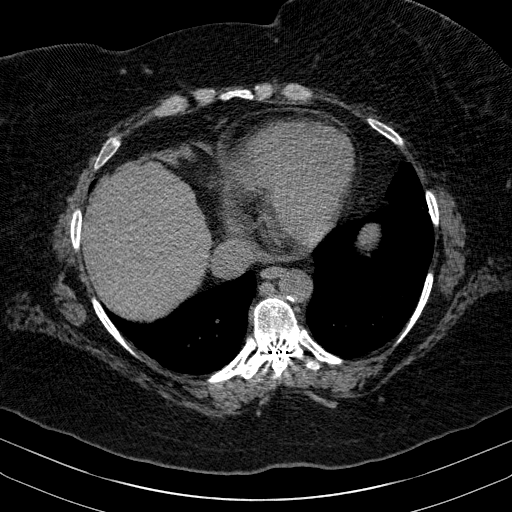
[im 50/139  lung]
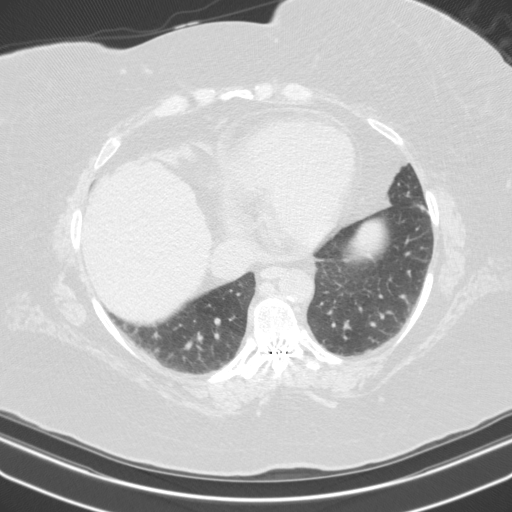
[im 60/139  lung]
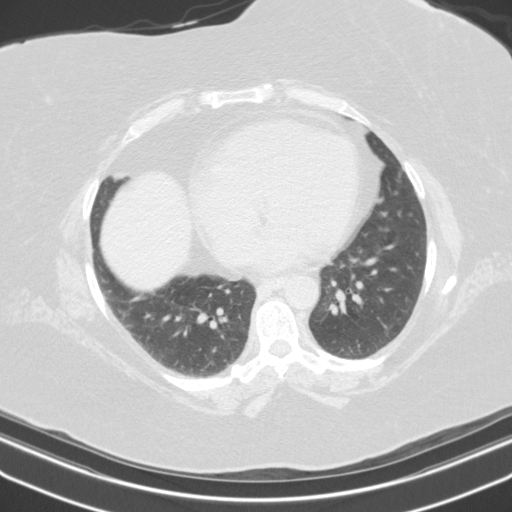
[im 79/139  lung]
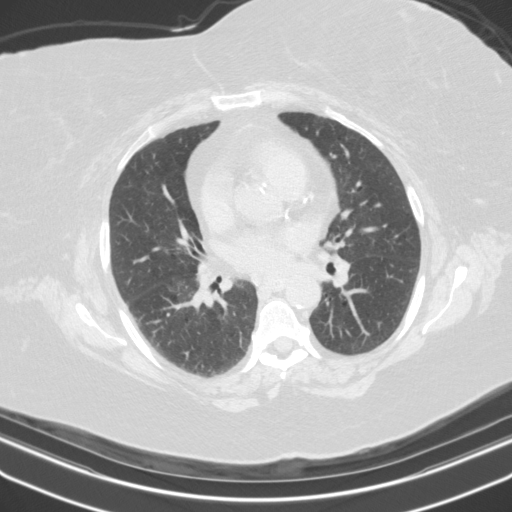
[im 89/139  lung]
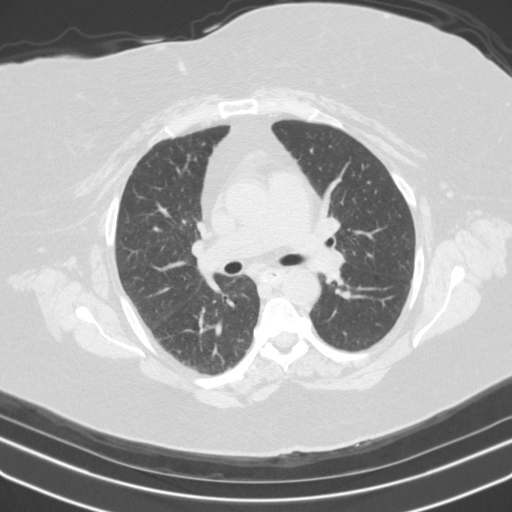
[im 99/139  mediastinal]
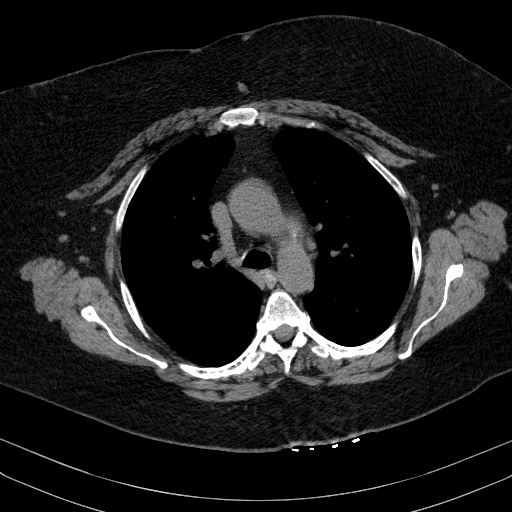
[im 99/139  lung]
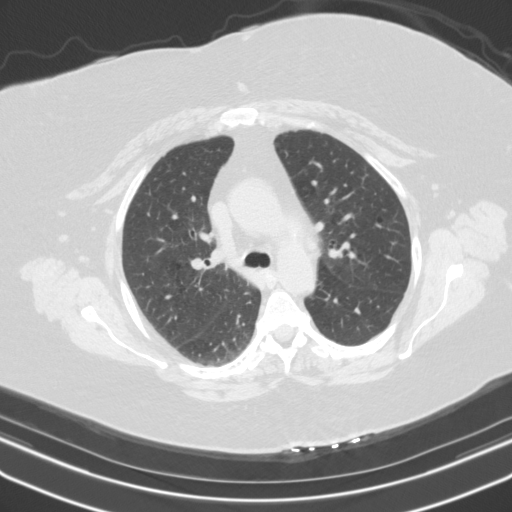
[im 109/139  lung]
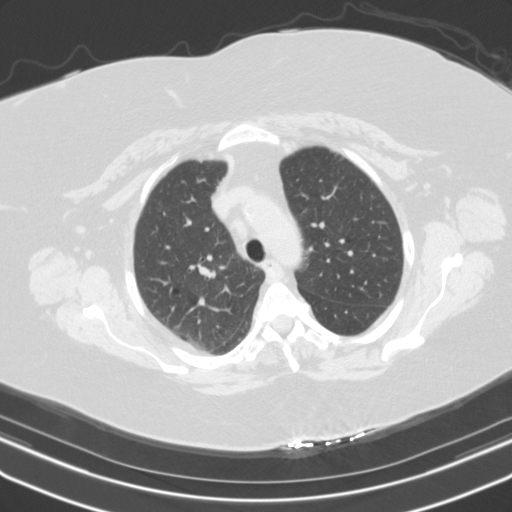
[im 119/139  lung]
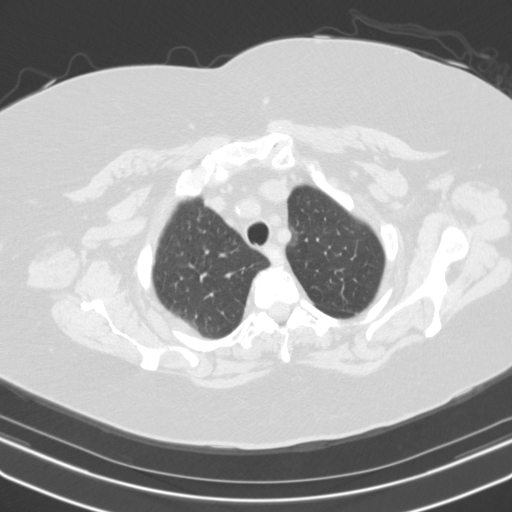
[im 129/139  lung]
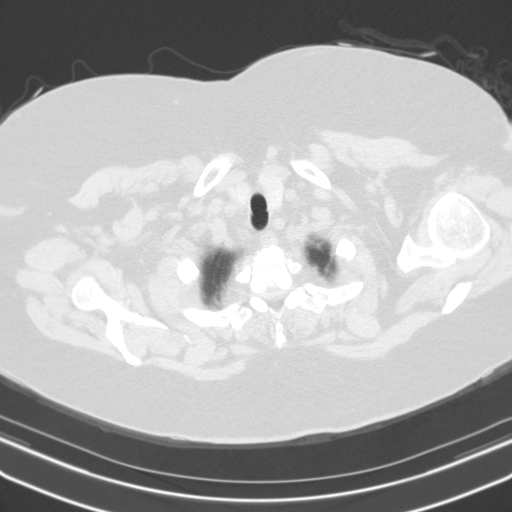

[Series 3: cor · coronal · 0.55mm/px · 3 of 136 slices shown]
[im 28/136  lung]
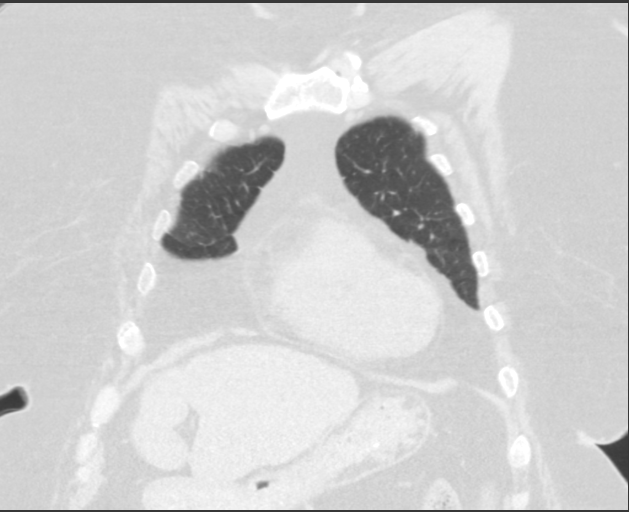
[im 55/136  lung]
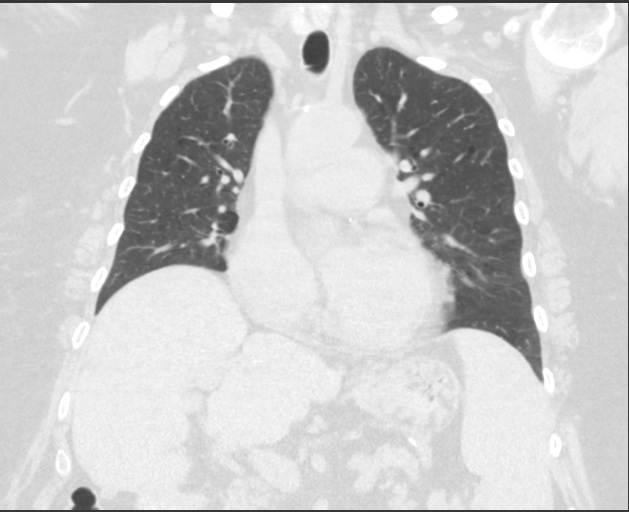
[im 82/136  lung]
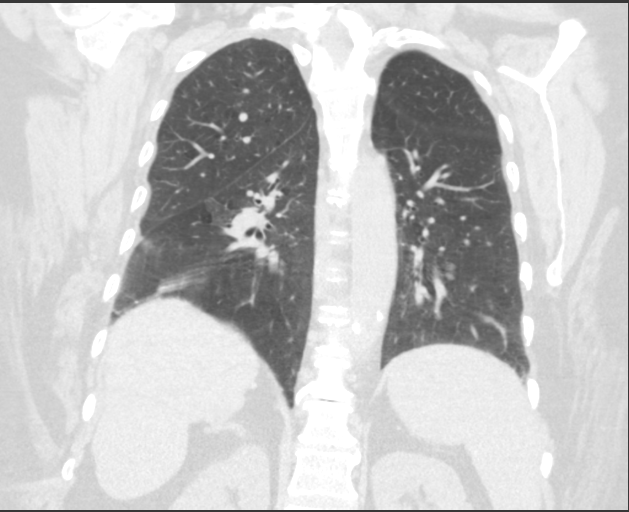

[15 of 36 positions shown; findings below may reference images not displayed]

FINDINGS: Chest wall: No breast masses, supraclavicular or axillary
lymphadenopathy. The thyroid gland appears normal.

Cardiovascular: The heart is normal in size. No pericardial
effusion. Minimal scattered atherosclerotic calcifications involving
the aorta. Three-vessel coronary artery calcifications appear
stable.

Mediastinum/Nodes: Small scattered mediastinal and hilar lymph nodes
are stable. No mass or overt adenopathy. The esophagus is grossly
normal.

Lungs/Pleura: Stable mild emphysematous changes and pulmonary
scarring. No infiltrates, edema or effusions. No worrisome pulmonary
lesions or definite pulmonary nodules. Right apical density has the
appearance of scarring change and measures a maximum of 10 mm on
image 18. This appears stable.

Upper Abdomen: Stable advanced cirrhotic changes involving the liver
along with portal venous hypertension, portal venous collaterals and
splenomegaly. No obvious hepatic mass and no ascites.

Musculoskeletal: No significant bony findings. A spinal cord
stimulator is noted in the mid thoracic spine and cervical spine
fusion hardware is noted.
IMPRESSION: 1. Stable emphysematous changes and pulmonary scarring. No worrisome
pulmonary lesions or acute pulmonary findings.
2. Stable small scattered mediastinal and hilar lymph nodes but no
mass or adenopathy.
3. Stable coronary artery calcifications.
4. Stable cirrhotic changes involving the liver along with portal
venous hypertension, portal venous collaterals and splenomegaly.

## 2017-02-17 ENCOUNTER — Other Ambulatory Visit: Payer: Self-pay | Admitting: Student

## 2017-02-17 DIAGNOSIS — E66813 Obesity, class 3: Secondary | ICD-10-CM

## 2017-02-17 DIAGNOSIS — Z6841 Body Mass Index (BMI) 40.0 and over, adult: Principal | ICD-10-CM

## 2017-02-22 IMAGING — US US ABDOMEN COMPLETE
1 series · 14 of 25 positions shown · non-contrast
Comparison: Ultrasound 09/15/2014.  CT 06/13/2014.

CLINICAL DATA: Abdominal pain.

EXAM:
ULTRASOUND ABDOMEN COMPLETE

[Series 1: us abdomen complete · 0.26mm/px · 14 of 79 slices shown]
[im 1/79]
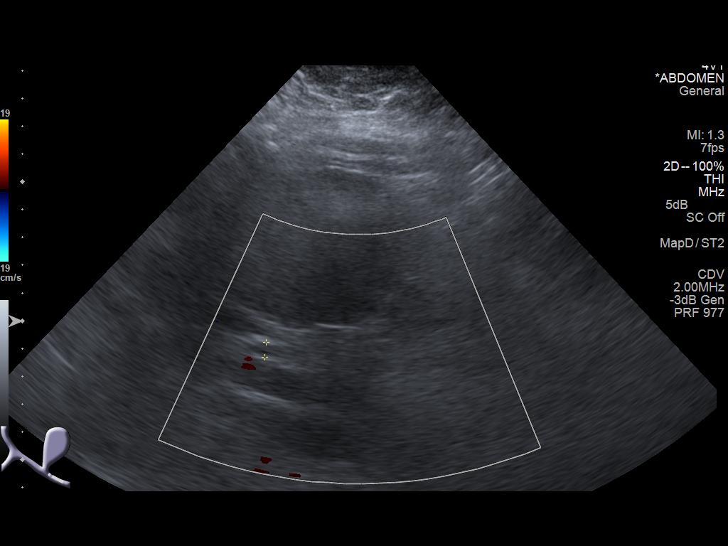
[im 7/79]
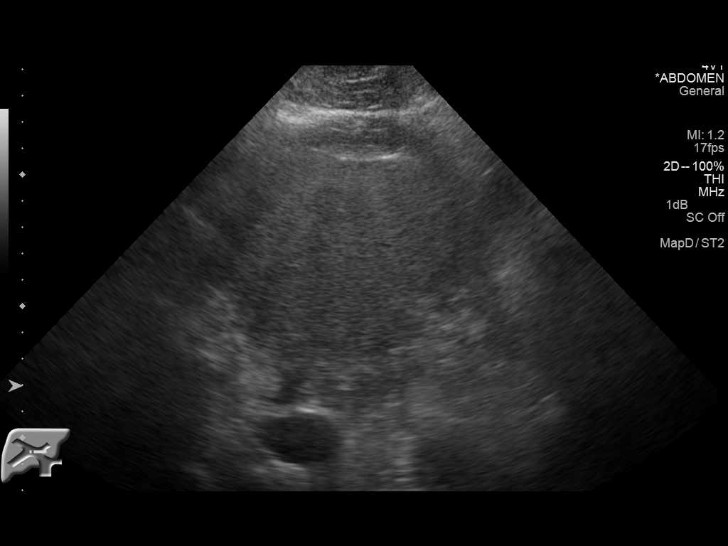
[im 14/79]
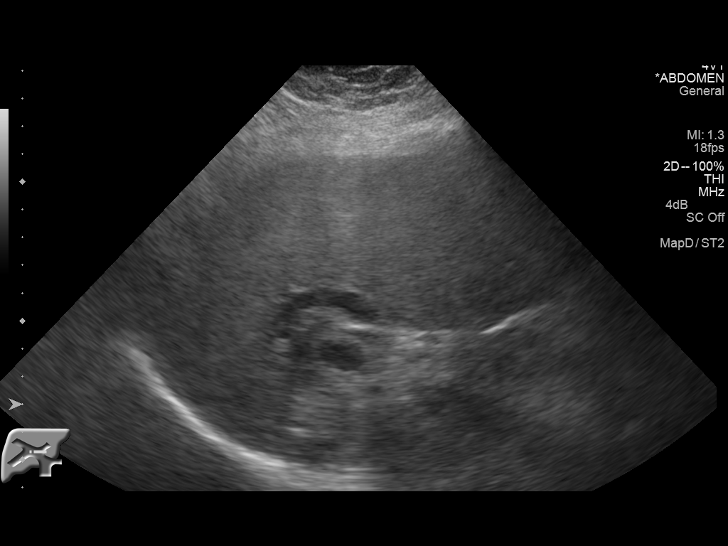
[im 20/79]
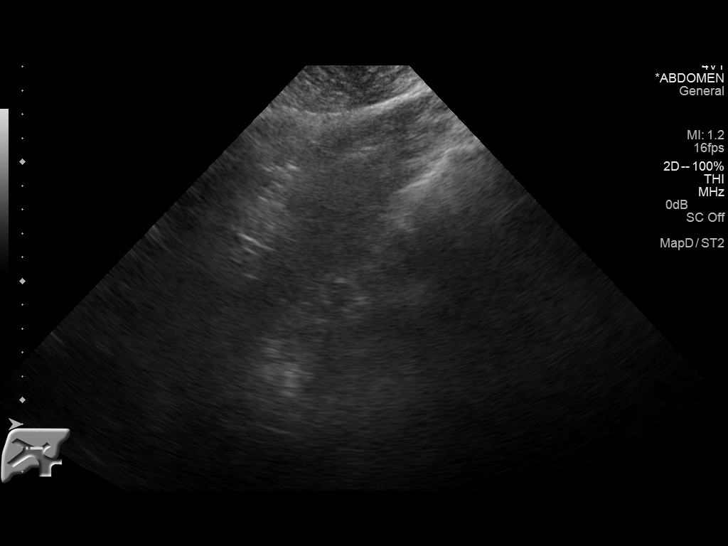
[im 27/79]
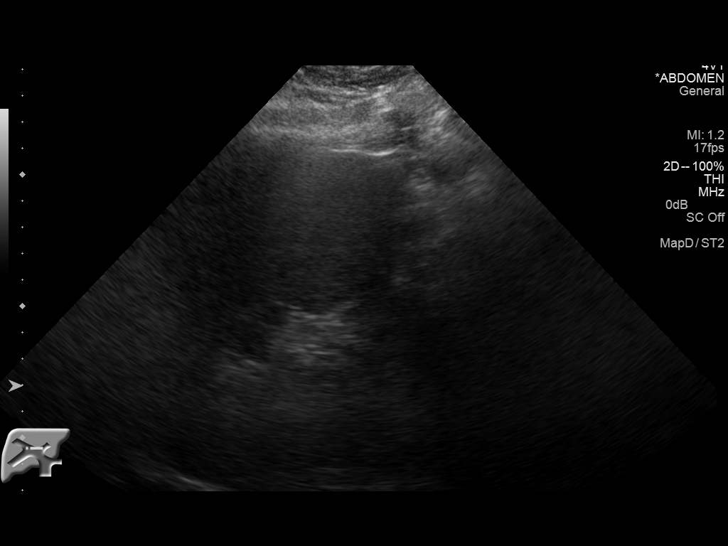
[im 30/79]
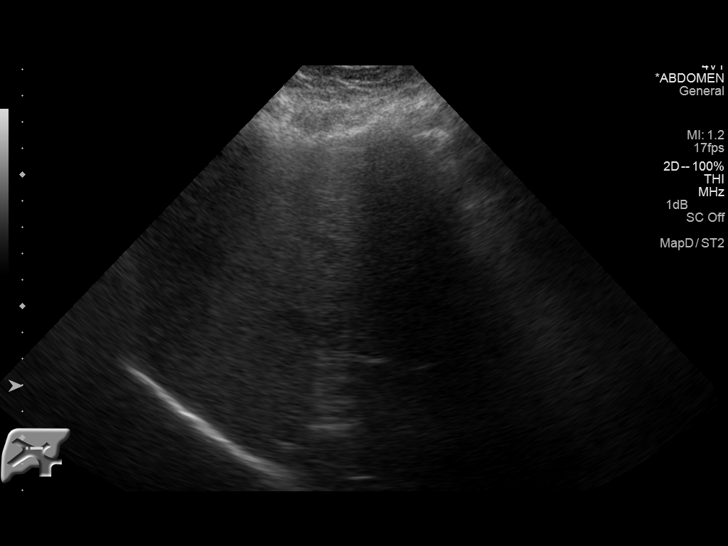
[im 36/79]
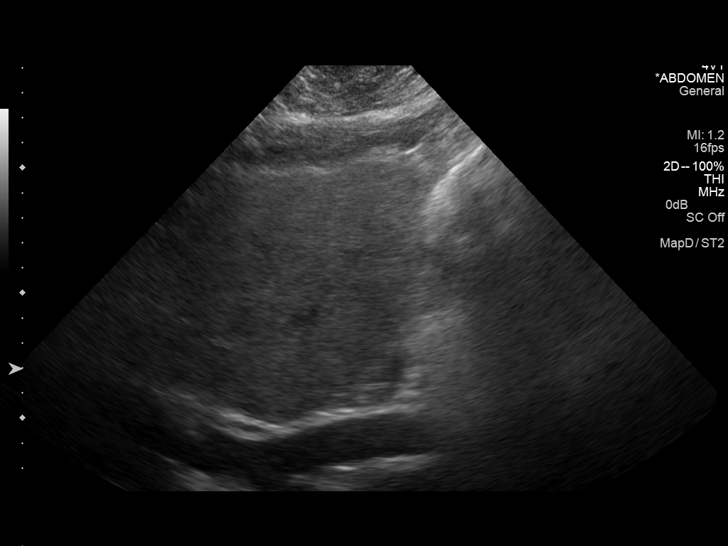
[im 43/79]
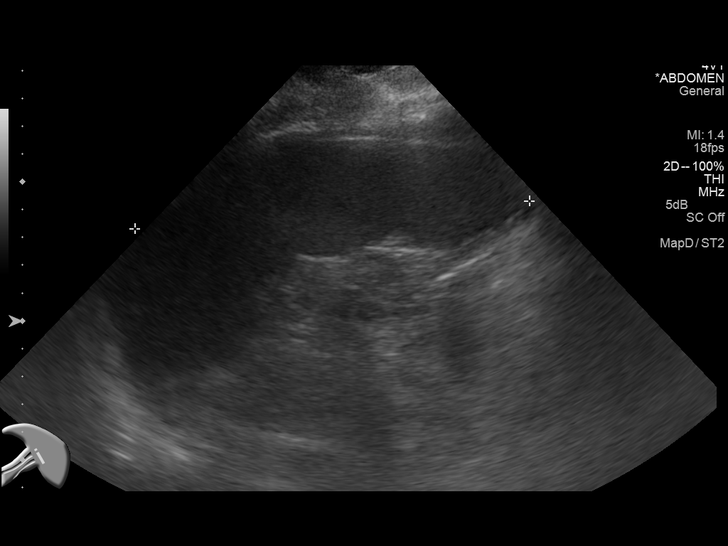
[im 49/79]
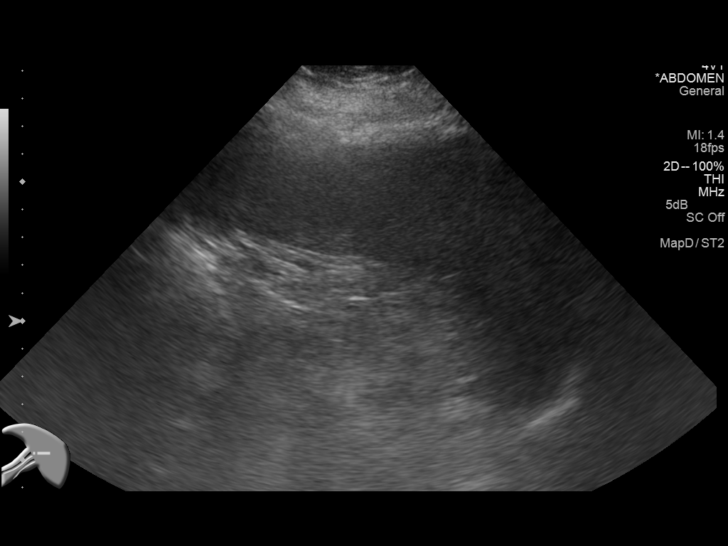
[im 53/79]
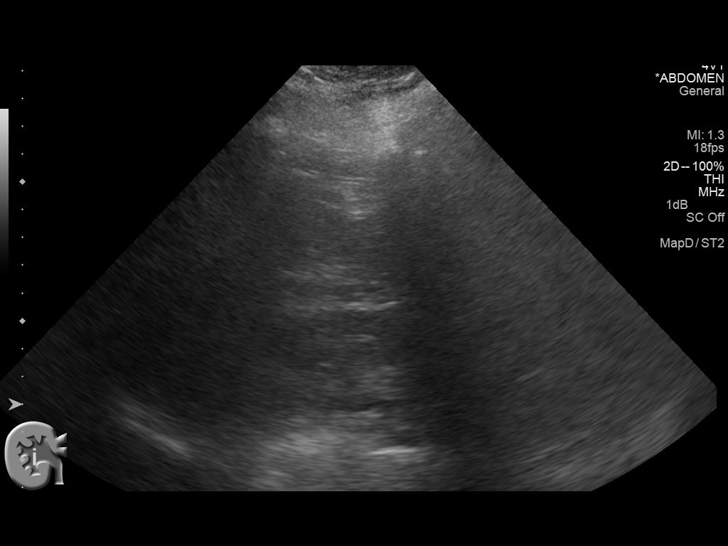
[im 59/79]
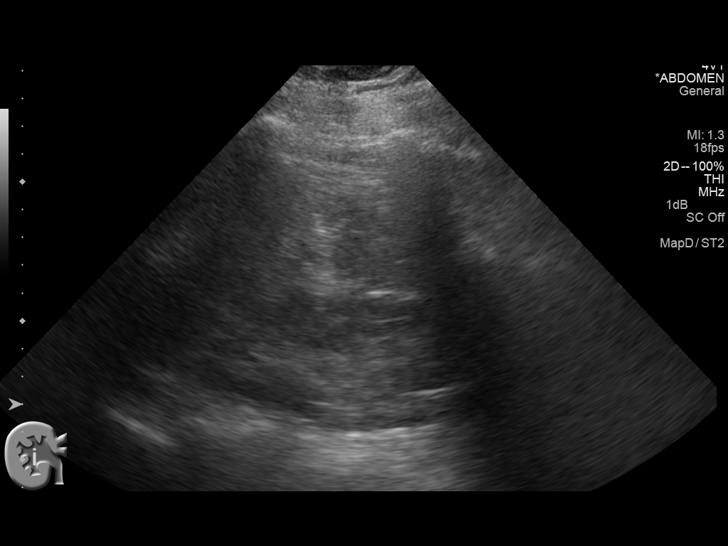
[im 66/79]
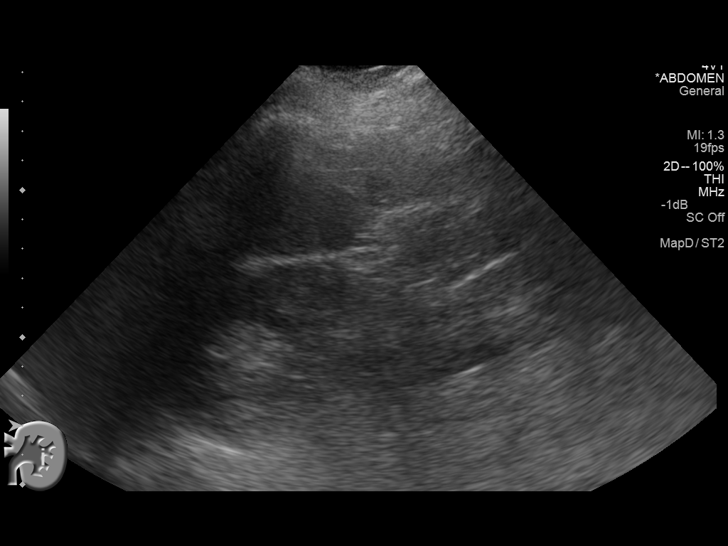
[im 72/79]
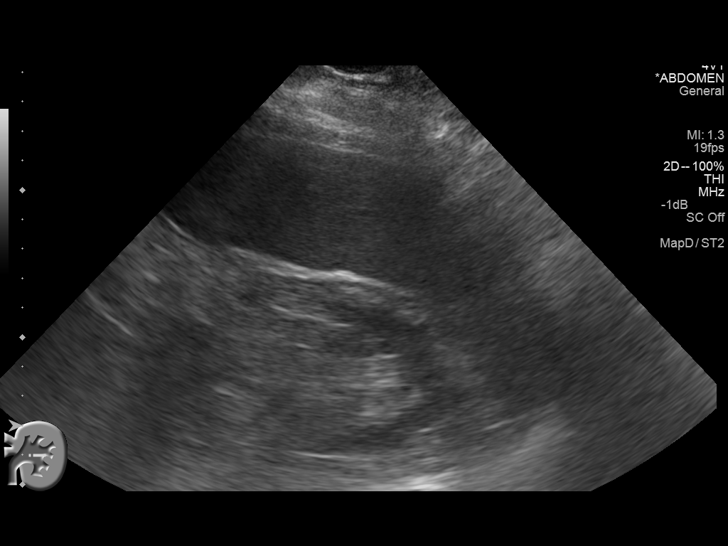
[im 79/79]
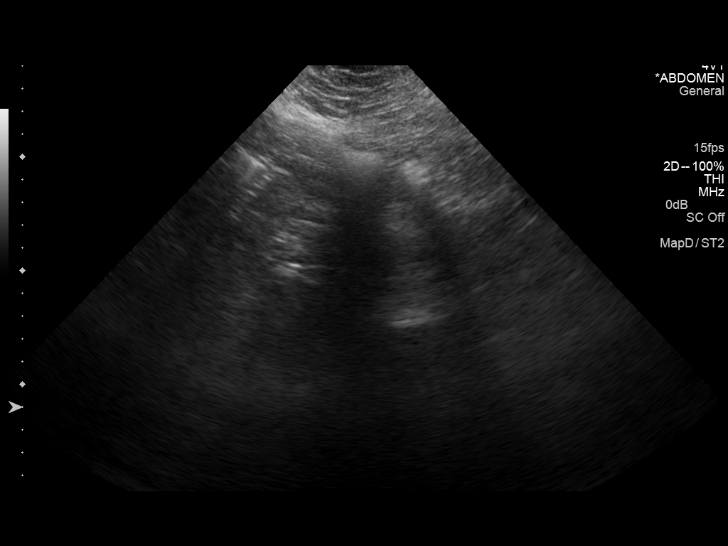

[14 of 25 positions shown; findings below may reference images not displayed]

FINDINGS: Gallbladder: Cholecystectomy.

Common bile duct: Diameter: 5.4 mm

Liver: Liver is dense and nodular consistent with patient's known
cirrhosis. No focal hepatic abnormality identified.

IVC: No abnormality visualized.

Pancreas: Visualized portion unremarkable.

Spleen: Spleen measures 14.2 cm with a volume of 556.1 cubic cm.
Findings consist with splenomegaly.

Right Kidney: Length: 11.8 cm. Echogenicity within normal limits. No
mass or hydronephrosis visualized.

Left Kidney: Length: 12.2 cm. Echogenicity within normal limits. No
mass or hydronephrosis visualized.

Abdominal aorta: No aneurysm visualized.

Other findings: None.
IMPRESSION: 1. Findings consistent with cirrhosis.  Associated splenomegaly.
2. Cholecystectomy. No biliary distention. Similar findings noted on
prior exams.

## 2017-02-24 ENCOUNTER — Other Ambulatory Visit: Payer: Self-pay | Admitting: Student

## 2017-02-24 DIAGNOSIS — M25562 Pain in left knee: Secondary | ICD-10-CM

## 2017-02-24 DIAGNOSIS — M1712 Unilateral primary osteoarthritis, left knee: Secondary | ICD-10-CM

## 2017-02-27 ENCOUNTER — Ambulatory Visit
Admission: RE | Admit: 2017-02-27 | Discharge: 2017-02-27 | Disposition: A | Discharge status: Home / Self Care | Payer: Medicare Other | Source: Ambulatory Visit | Attending: Student | Admitting: Student

## 2017-02-27 ENCOUNTER — Other Ambulatory Visit: Payer: Self-pay | Admitting: Student

## 2017-02-27 DIAGNOSIS — M75101 Unspecified rotator cuff tear or rupture of right shoulder, not specified as traumatic: Secondary | ICD-10-CM | POA: Diagnosis not present

## 2017-02-27 DIAGNOSIS — E661 Drug-induced obesity: Secondary | ICD-10-CM | POA: Insufficient documentation

## 2017-02-27 DIAGNOSIS — M5412 Radiculopathy, cervical region: Secondary | ICD-10-CM | POA: Diagnosis not present

## 2017-02-27 DIAGNOSIS — Z6841 Body Mass Index (BMI) 40.0 and over, adult: Secondary | ICD-10-CM | POA: Diagnosis not present

## 2017-02-27 DIAGNOSIS — M19011 Primary osteoarthritis, right shoulder: Secondary | ICD-10-CM | POA: Diagnosis not present

## 2017-02-27 DIAGNOSIS — M4802 Spinal stenosis, cervical region: Secondary | ICD-10-CM | POA: Diagnosis not present

## 2017-02-27 DIAGNOSIS — M7581 Other shoulder lesions, right shoulder: Secondary | ICD-10-CM | POA: Insufficient documentation

## 2017-02-27 DIAGNOSIS — Z981 Arthrodesis status: Secondary | ICD-10-CM | POA: Diagnosis not present

## 2017-03-04 IMAGING — CT CT HEAD W/O CM
4 of 9 series · 14 of 47 positions shown, 15 images · non-contrast
Comparison: Neck CT 09/01/2015

CLINICAL DATA: Fall.  Fall from chair.  Neck pain.

EXAM:
CT HEAD WITHOUT CONTRAST
CT CERVICAL SPINE WITHOUT CONTRAST
TECHNIQUE: Multidetector CT imaging of the head and cervical spine was
performed following the standard protocol without intravenous
contrast. Multiplanar CT image reconstructions of the cervical spine
were also generated.

[Series 4: ax head wo recon · axial · 0.33mm/px · z∈[-169,-44]mm · 3 of 27 slices shown, 4 images]
[im 1/27  brain]
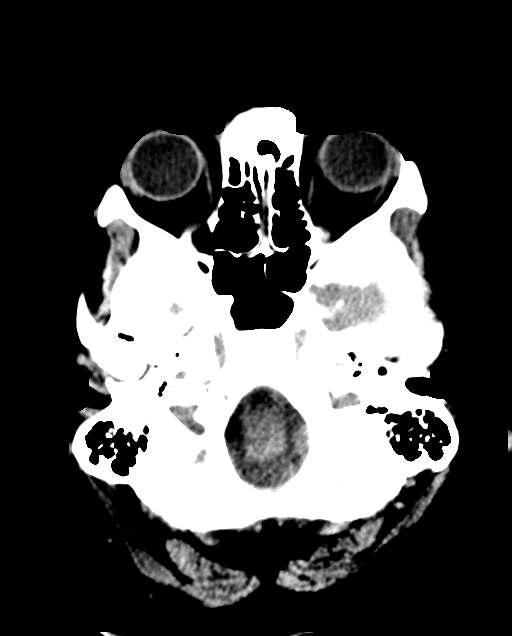
[im 1/27  bone]
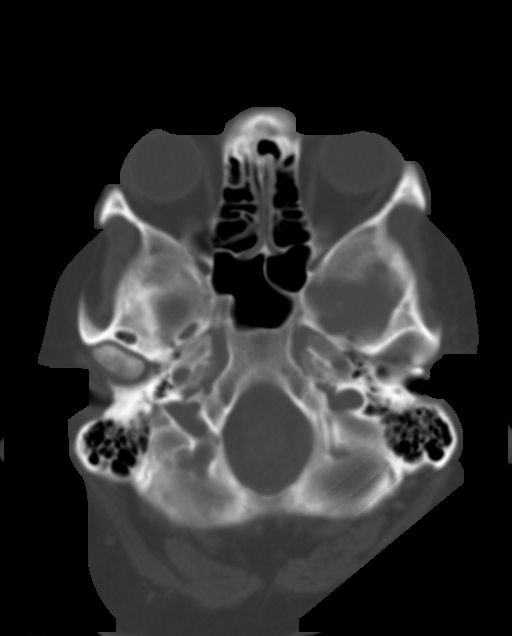
[im 14/27  brain]
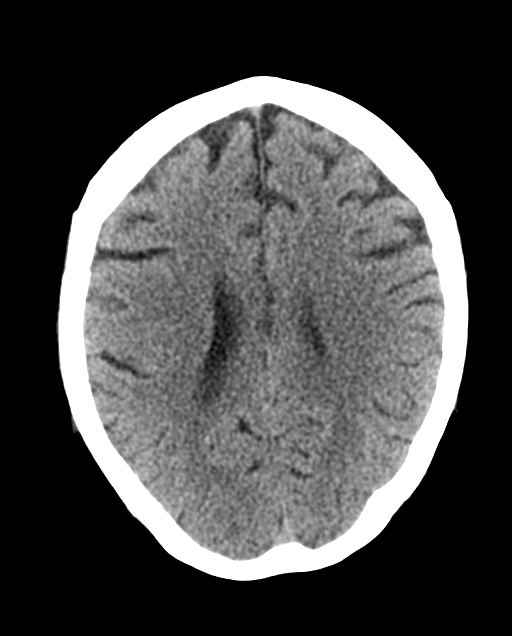
[im 27/27  brain]
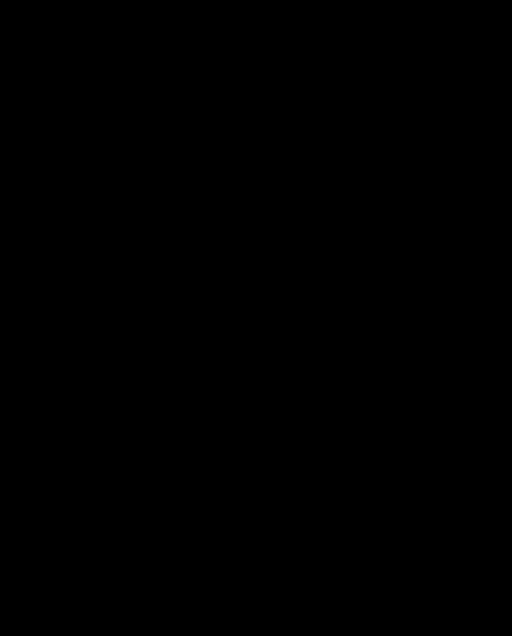

[Series 7: sagittal soft tissue · sagittal · 0.29mm/px · 1 of 47 slices shown]
[im 24/47  brain]
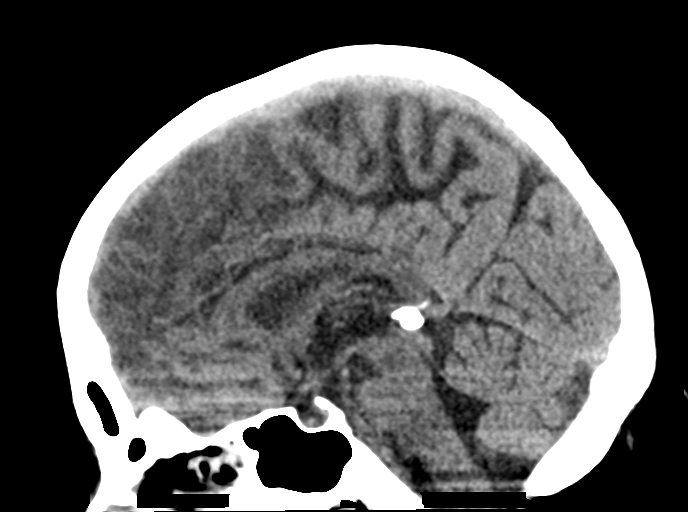

[Series 11: coronal bone · coronal · 0.26mm/px · 3 of 47 slices shown]
[im 36/47  brain]
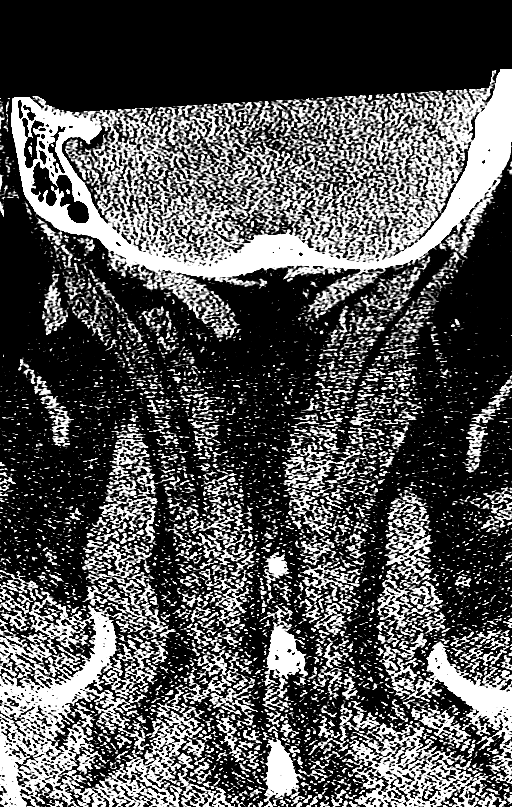
[im 38/47  brain]
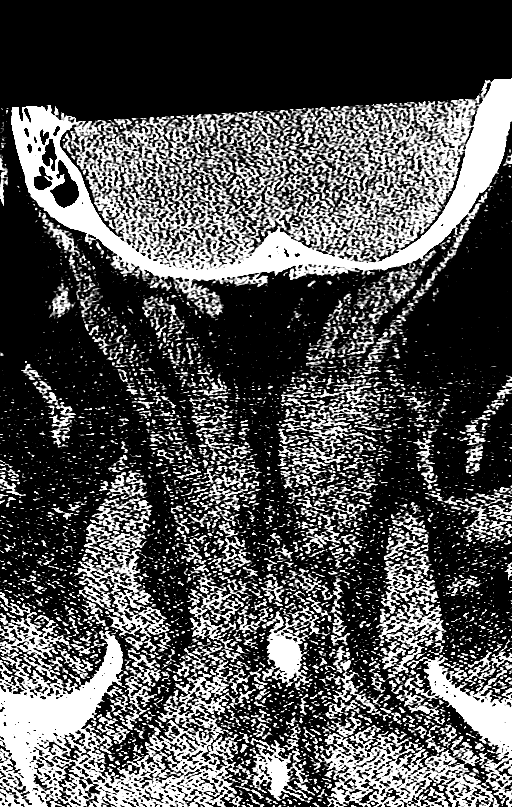
[im 40/47  brain]
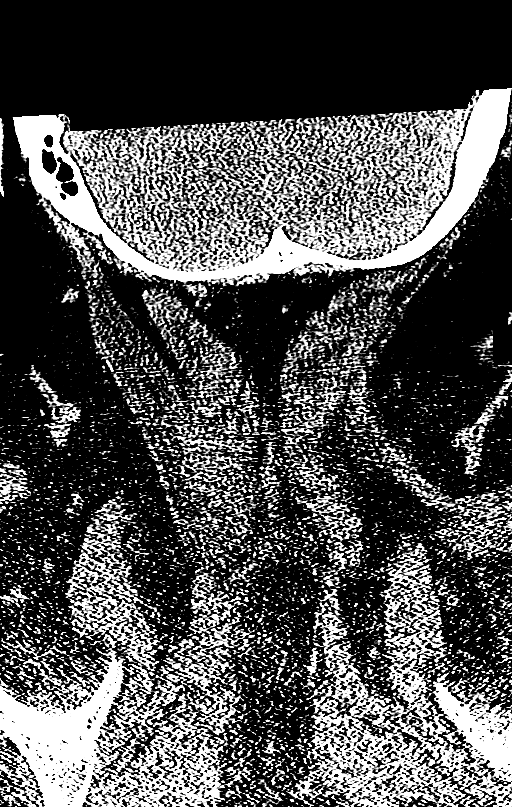

[Series 12: orthogonal bone · axial · 0.23mm/px · z∈[-351,-236]mm · 7 of 92 slices shown]
[im 12/92  bone]
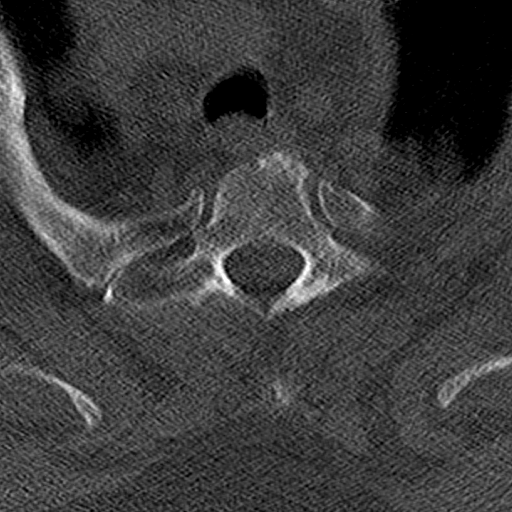
[im 23/92  bone]
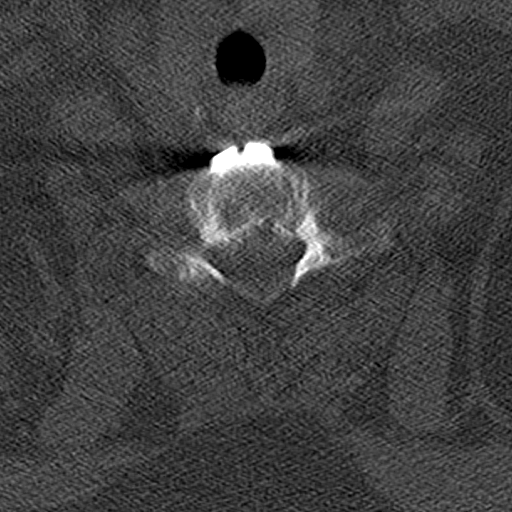
[im 35/92  bone]
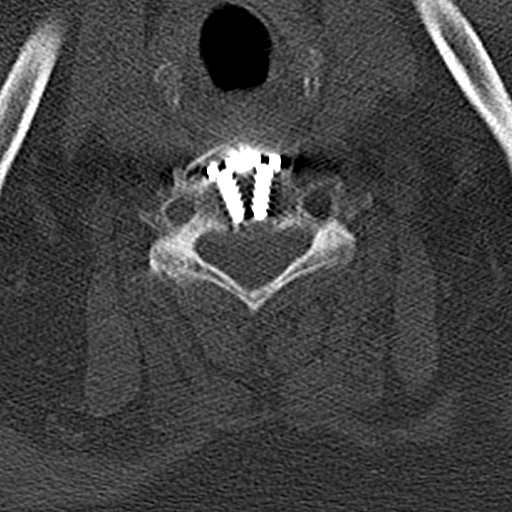
[im 46/92  bone]
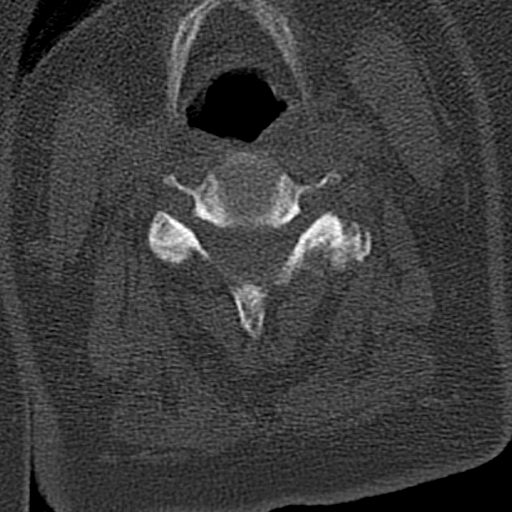
[im 57/92  bone]
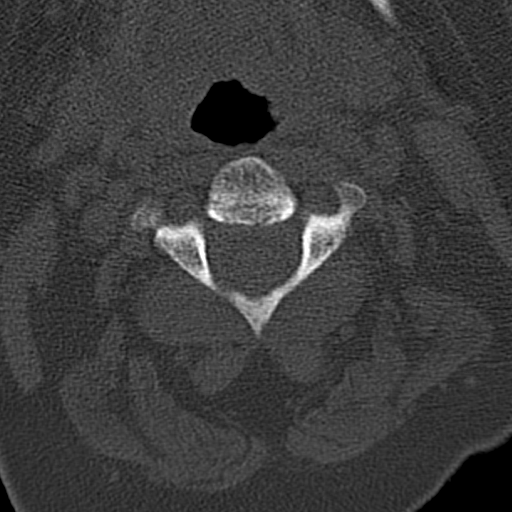
[im 69/92  bone]
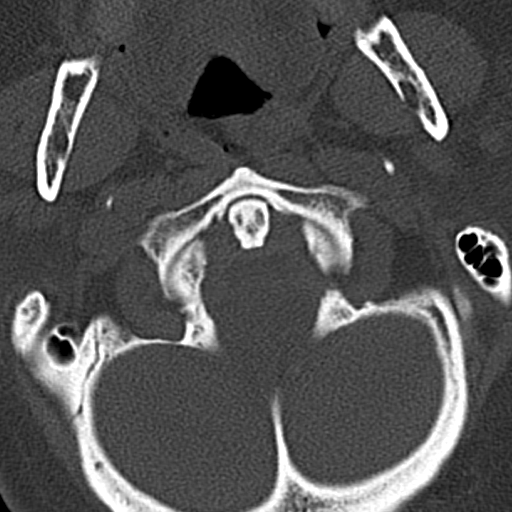
[im 80/92  bone]
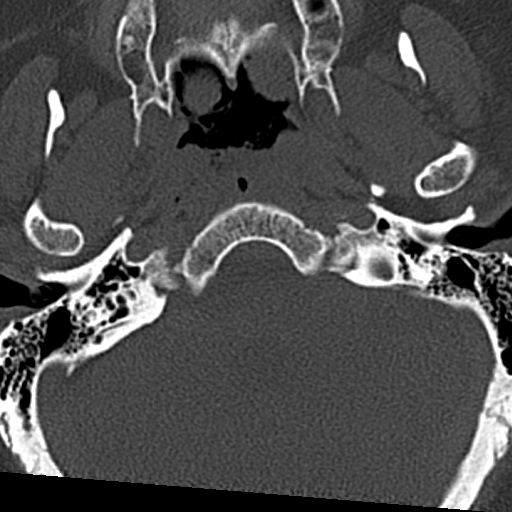

[14 of 47 positions shown; findings below may reference images not displayed]

FINDINGS: CT HEAD FINDINGS

Brain: No intracranial hemorrhage. No parenchymal contusion. No
midline shift or mass effect. Basilar cisterns are patent. No skull
base fracture. No fluid in the paranasal sinuses or mastoid air
cells. Orbits are normal.

Vascular: No hyperdense vessel or unexpected calcification.

Skull: Normal. Negative for fracture or focal lesion.

Sinuses/Orbits: Paranasal sinuses and mastoid air cells are clear.
Orbits are clear.

Other: None.

CT CERVICAL SPINE FINDINGS

Alignment: Normal alignment

Skull base and vertebrae: Normal craniocervical junction. Anterior
cervical fusion from C5-C7. No acute loss vertebral body height and
disc height. No subluxation.

Soft tissues and spinal canal: No epidural paraspinal hematoma.

Disc levels:  Unremarkable

Upper chest: Clear

Other: None
IMPRESSION: 1. No intracranial trauma.
2. No acute cervical spine findings.
3. Anterior cervical fusion without complication.

Launch template CT CAP with

## 2017-03-04 IMAGING — CR DG SHOULDER 2+V*R*
3 series · 3 of 3 positions shown · non-contrast
Comparison: 09/01/2015 radiographs of the right shoulder

CLINICAL DATA: Patient fell from chair.  Pain.

EXAM:
RIGHT SHOULDER - 2+ VIEW

[shoulder grashey]
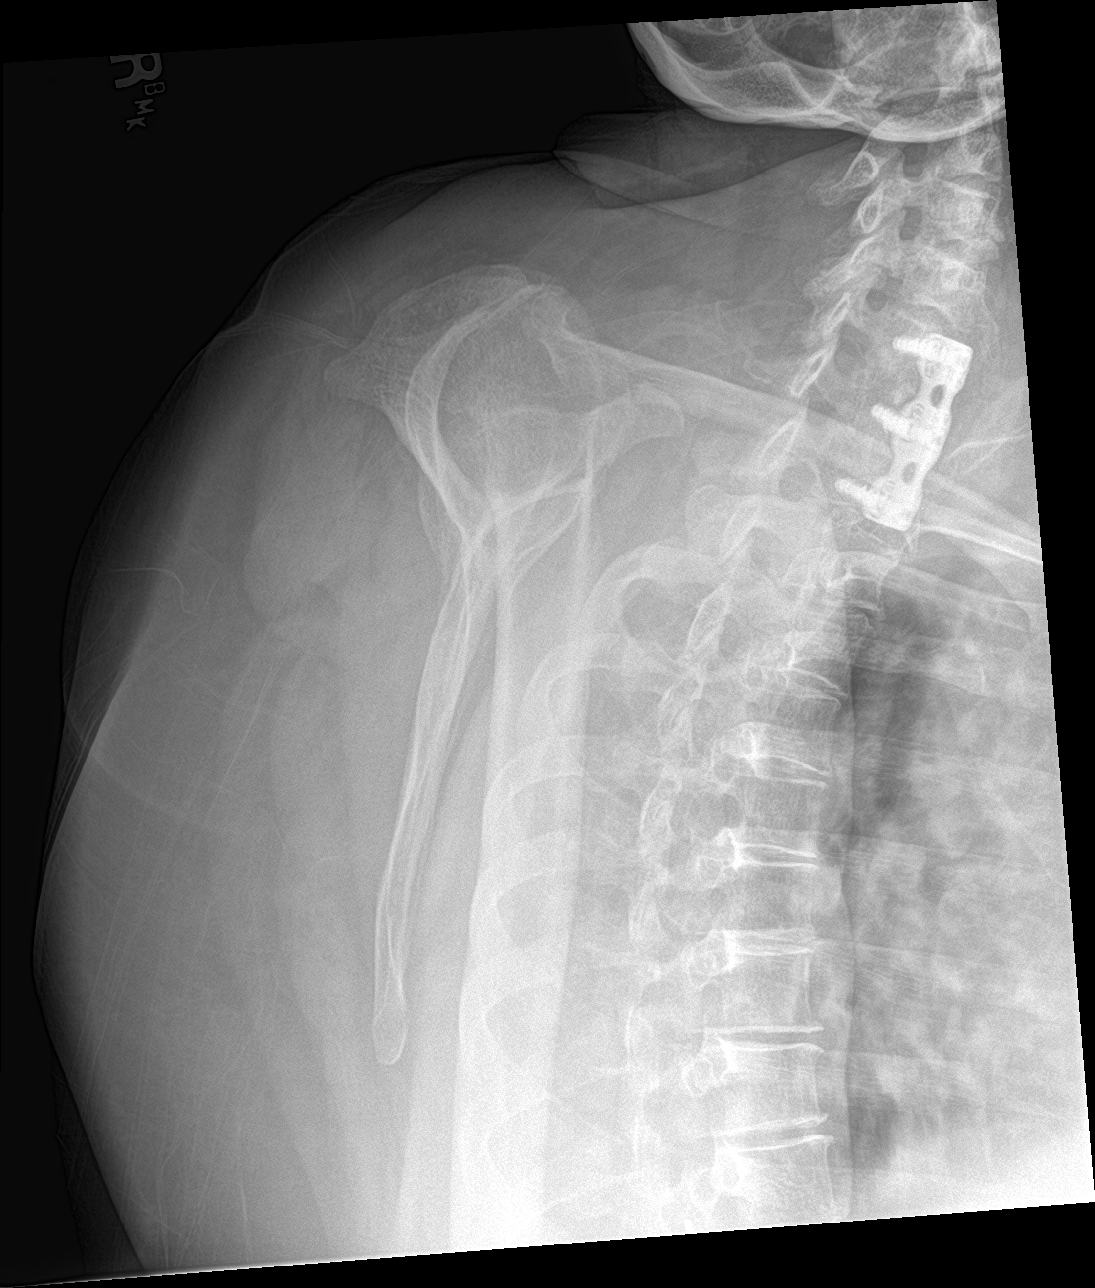

[shoulder y view]
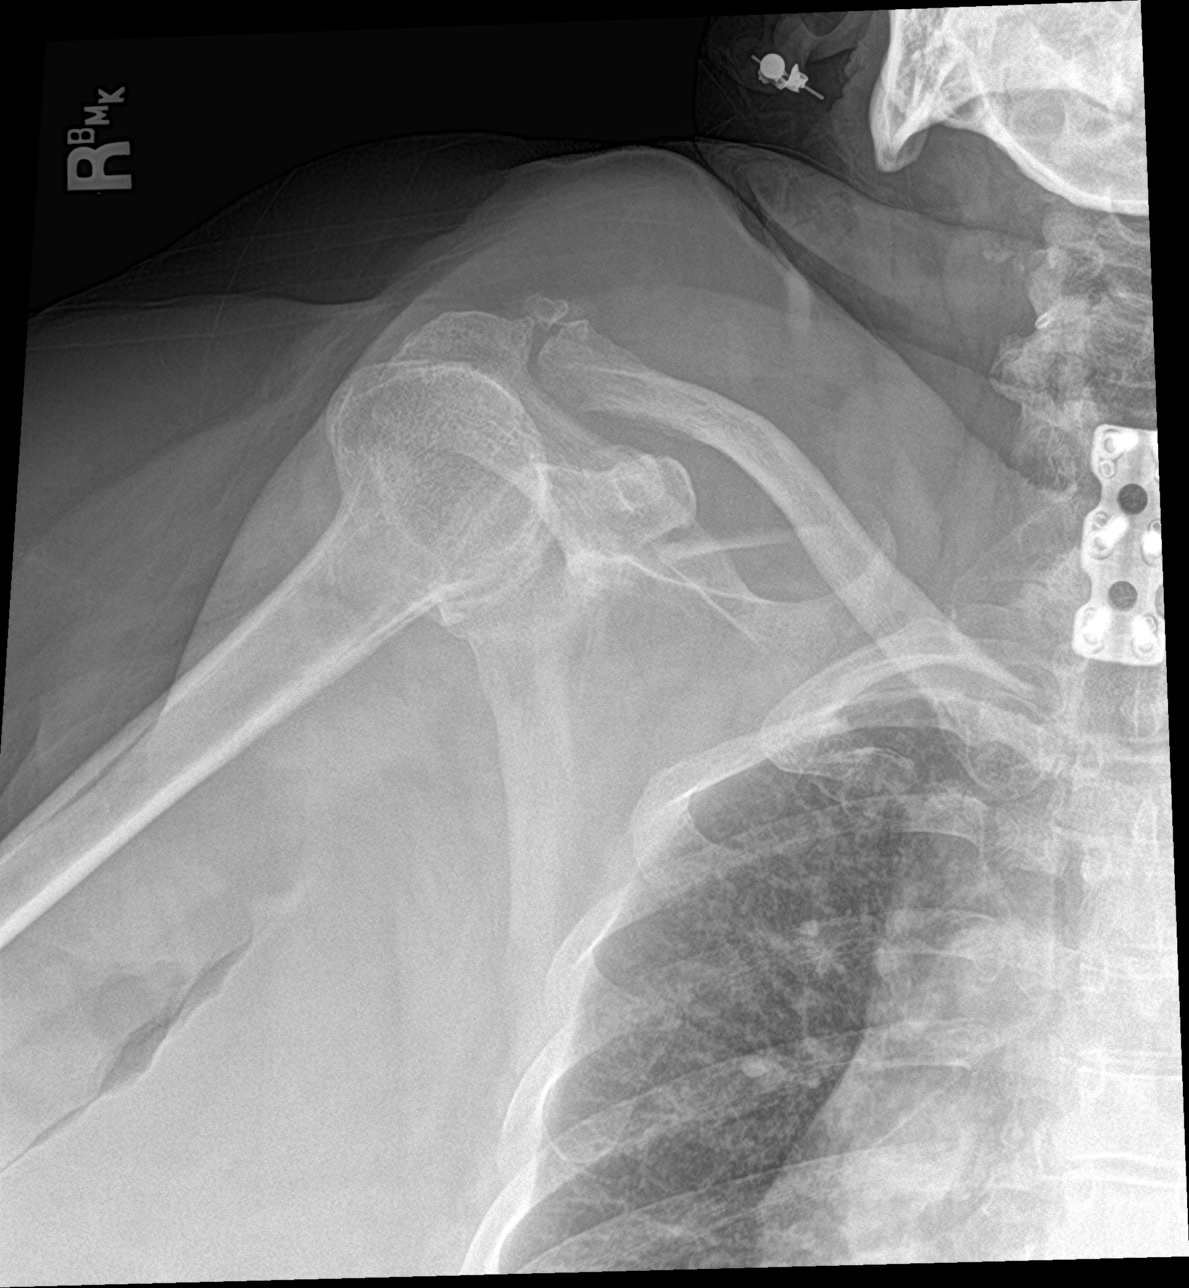

[shoulder axillary]
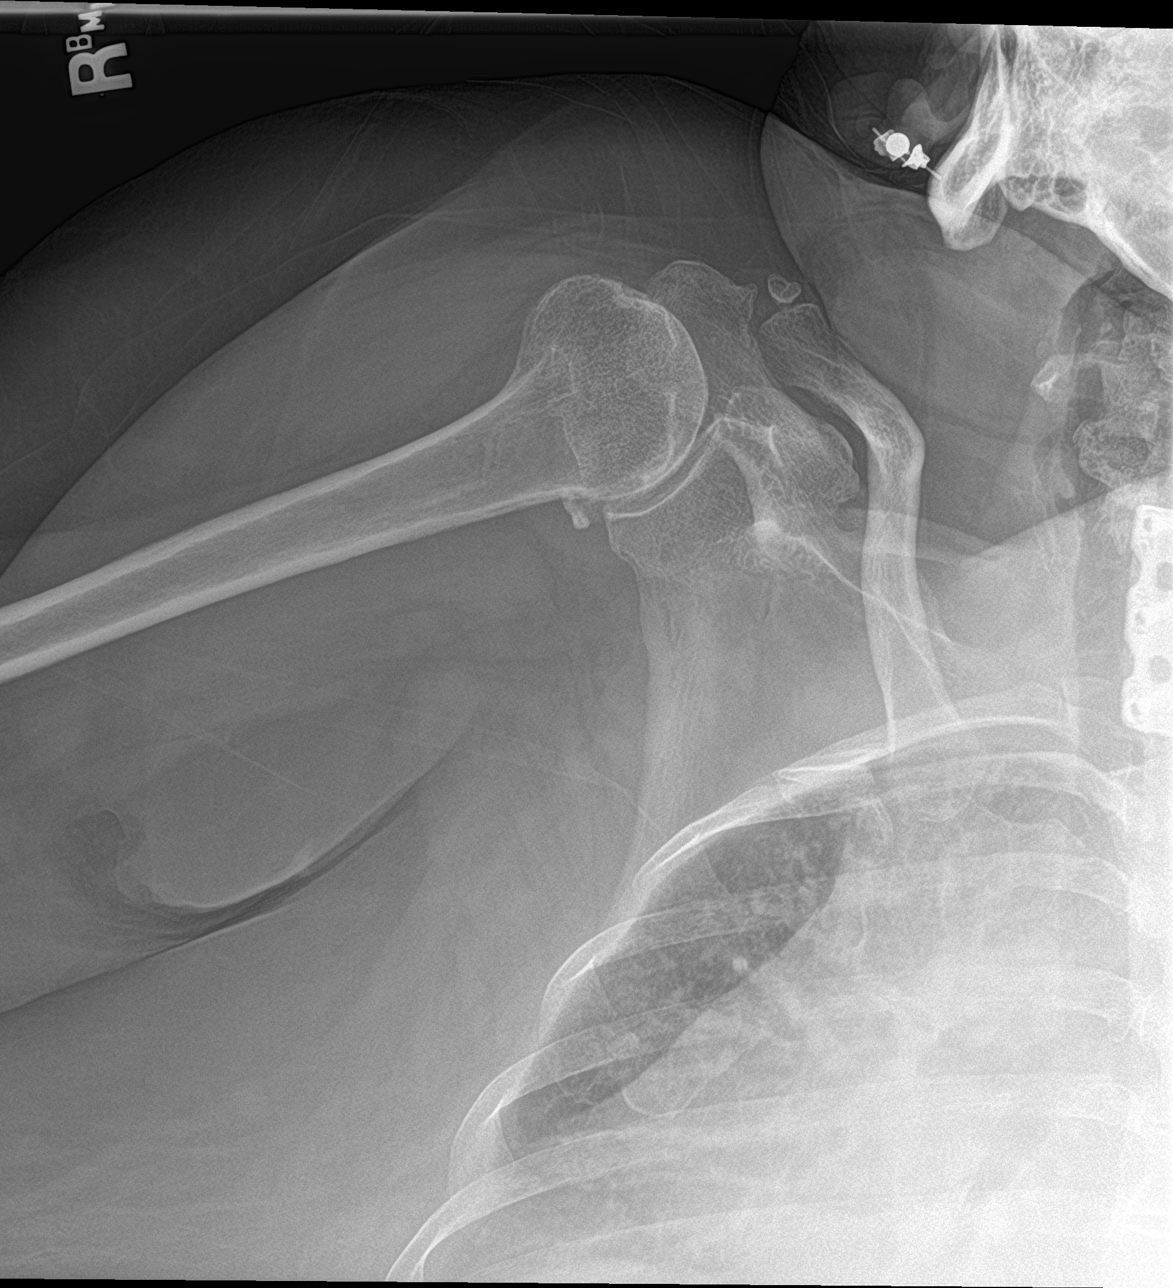

[3 of 3 positions shown; findings below may reference images not displayed]

FINDINGS: Osteoarthritis of the AC joint with adjacent ossicles.
Osteoarthritic joint space narrowing and sclerosis of the
glenohumeral joint without malalignment. No acute displaced fracture
or bone destruction. Previously suspected nondisplaced fracture at
the base the greater tuberosity is less apparent on current exam.

Patient status post C5 through C7 ACDF. Adjacent ribs and lung are
nonacute.
IMPRESSION: No acute osseous abnormality or malalignment. AC and glenohumeral
joint osteoarthritis.

ACDF of the cervical spine.

## 2017-03-04 IMAGING — CR DG KNEE COMPLETE 4+V*R*
4 series · 5 of 5 positions shown · non-contrast
Comparison: MRI of the knee from 05/23/2016

CLINICAL DATA: Right knee pain after fall from chair

EXAM:
RIGHT KNEE - COMPLETE 4+ VIEW

[Series 1: knee ap · 0.14mm/px · 2 of 2 slices shown]
[im 1/2]
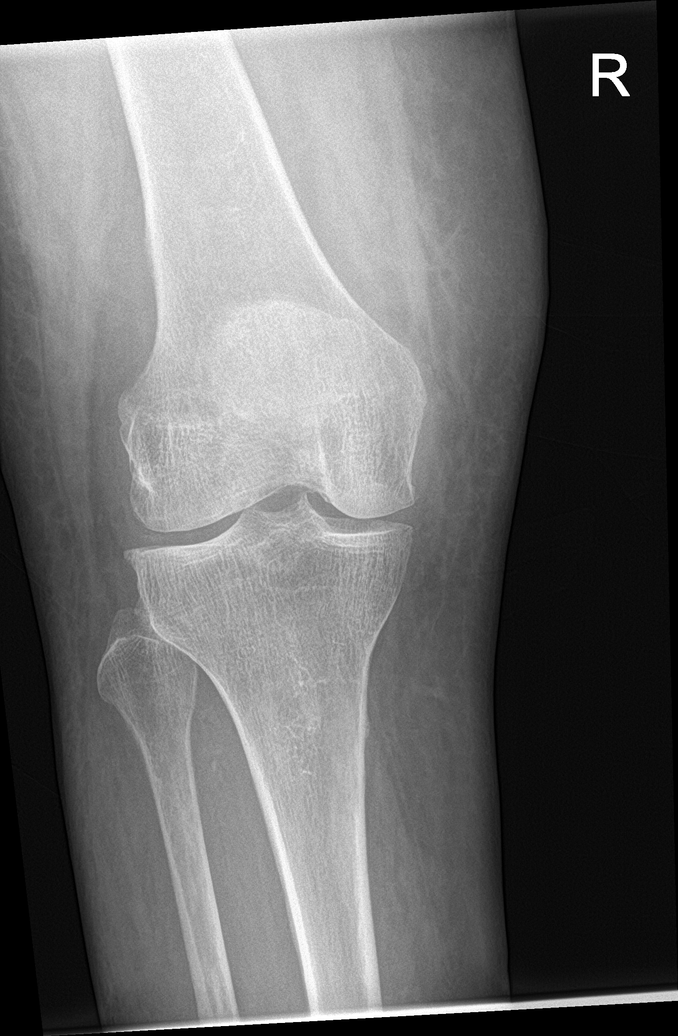
[im 2/2]
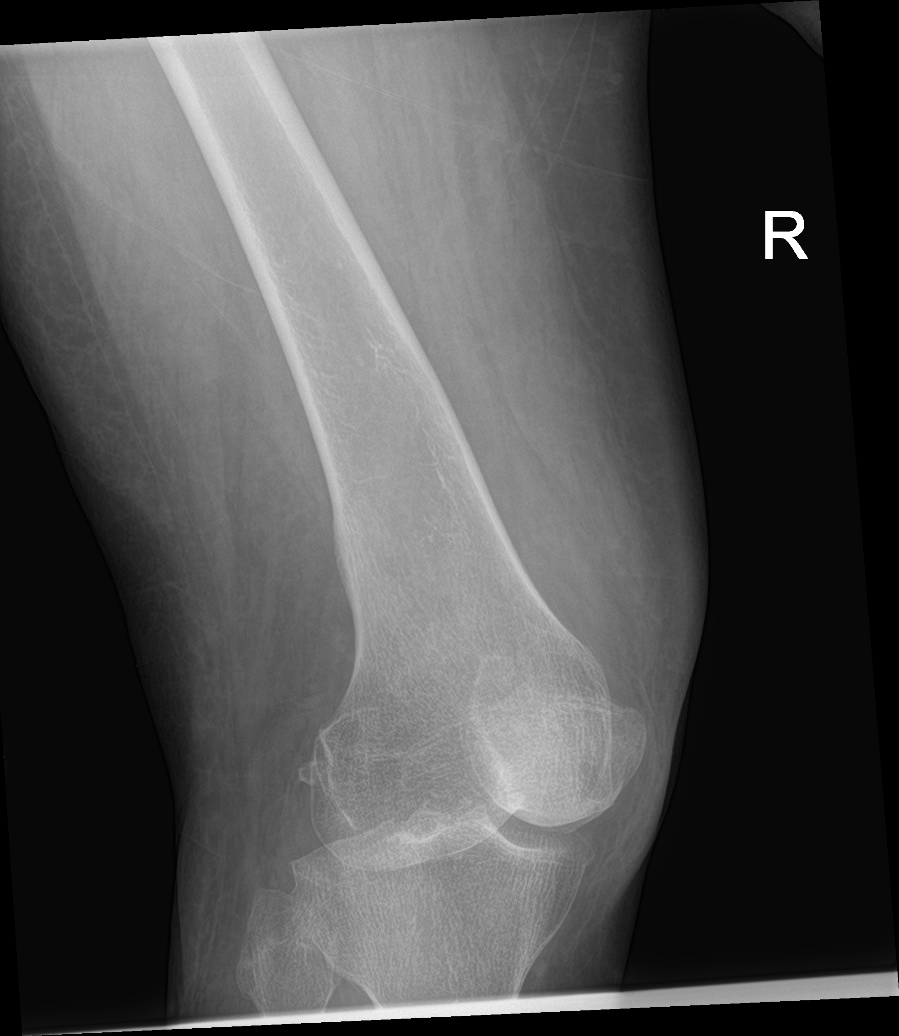

[knee obl (1 of 2)]
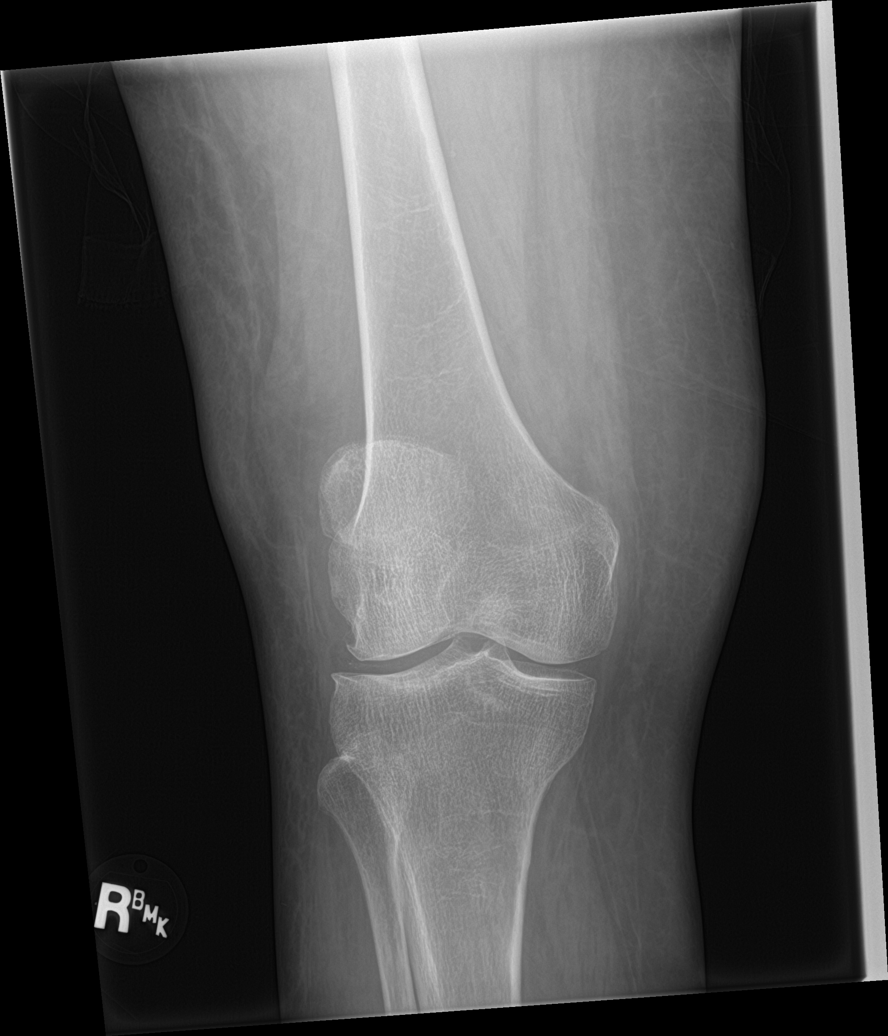

[knee obl (2 of 2)]
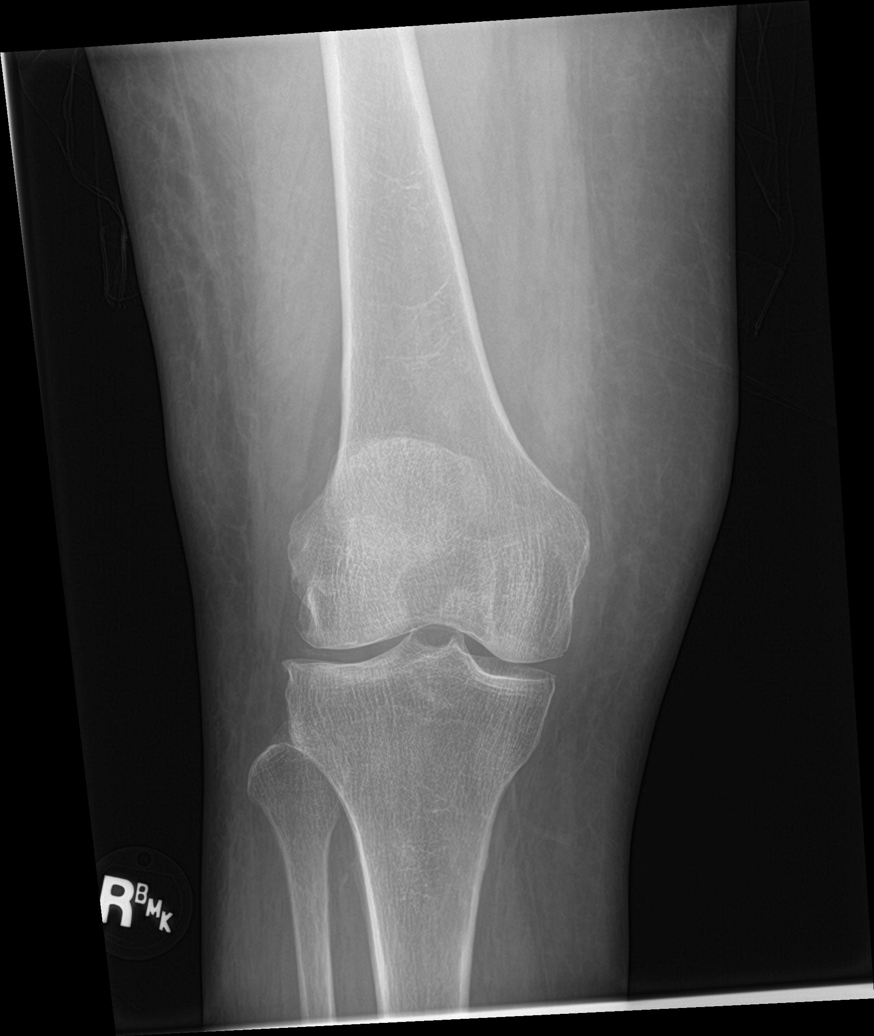

[knee lat]
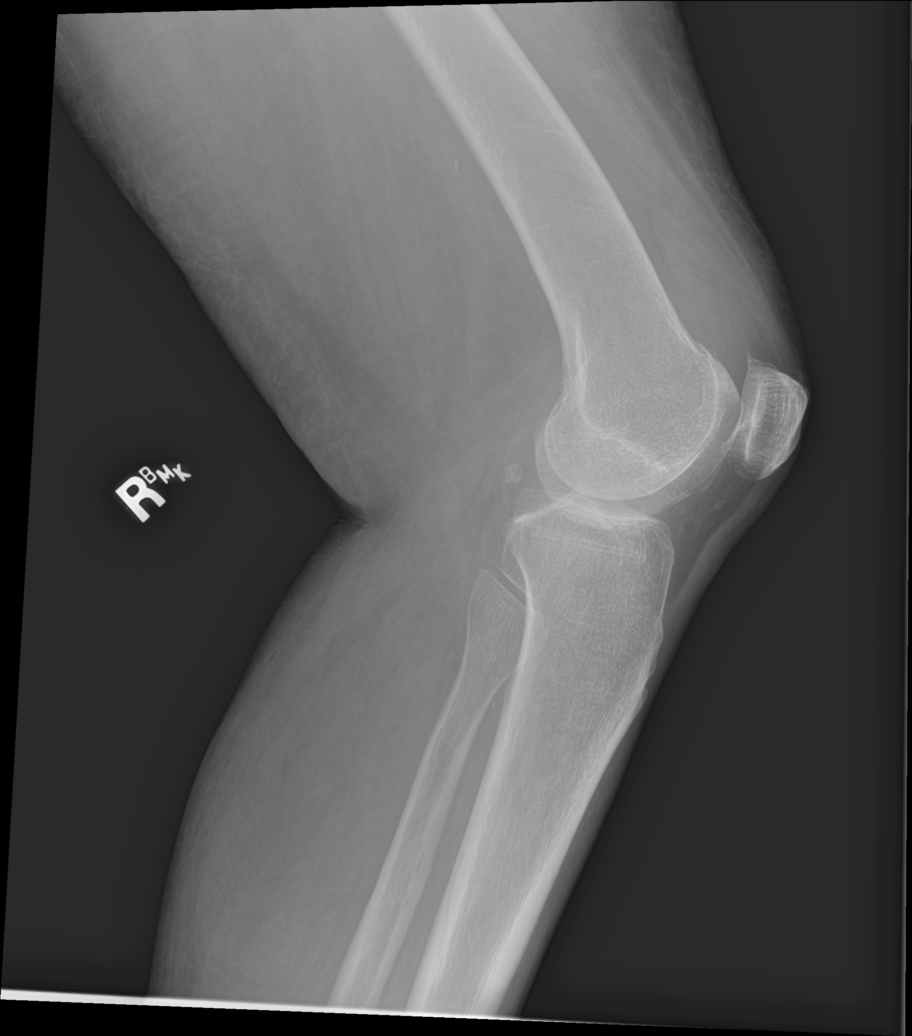

[5 of 5 positions shown; findings below may reference images not displayed]

FINDINGS: Small suprapatellar joint effusion. Minor degenerative joint space
narrowing of the femorotibial compartment with chondrocalcinosis of
hyaline cartilage. Joint space narrowing and spurring also noted of
the patellofemoral compartment. No acute displaced fracture. No
intra-articular loose bodies.
IMPRESSION: Joint effusion with osteoarthritis of the patellofemoral and
femorotibial compartments. No acute fracture noted.

## 2017-03-04 IMAGING — CR DG HIP (WITH OR WITHOUT PELVIS) 2-3V*R*
3 series · 3 of 3 positions shown · non-contrast
Comparison: 05/31/2016

CLINICAL DATA: Right hip pain after fall from chair

EXAM:
DG HIP (WITH OR WITHOUT PELVIS) 2-3V RIGHT

[pelvis ap]
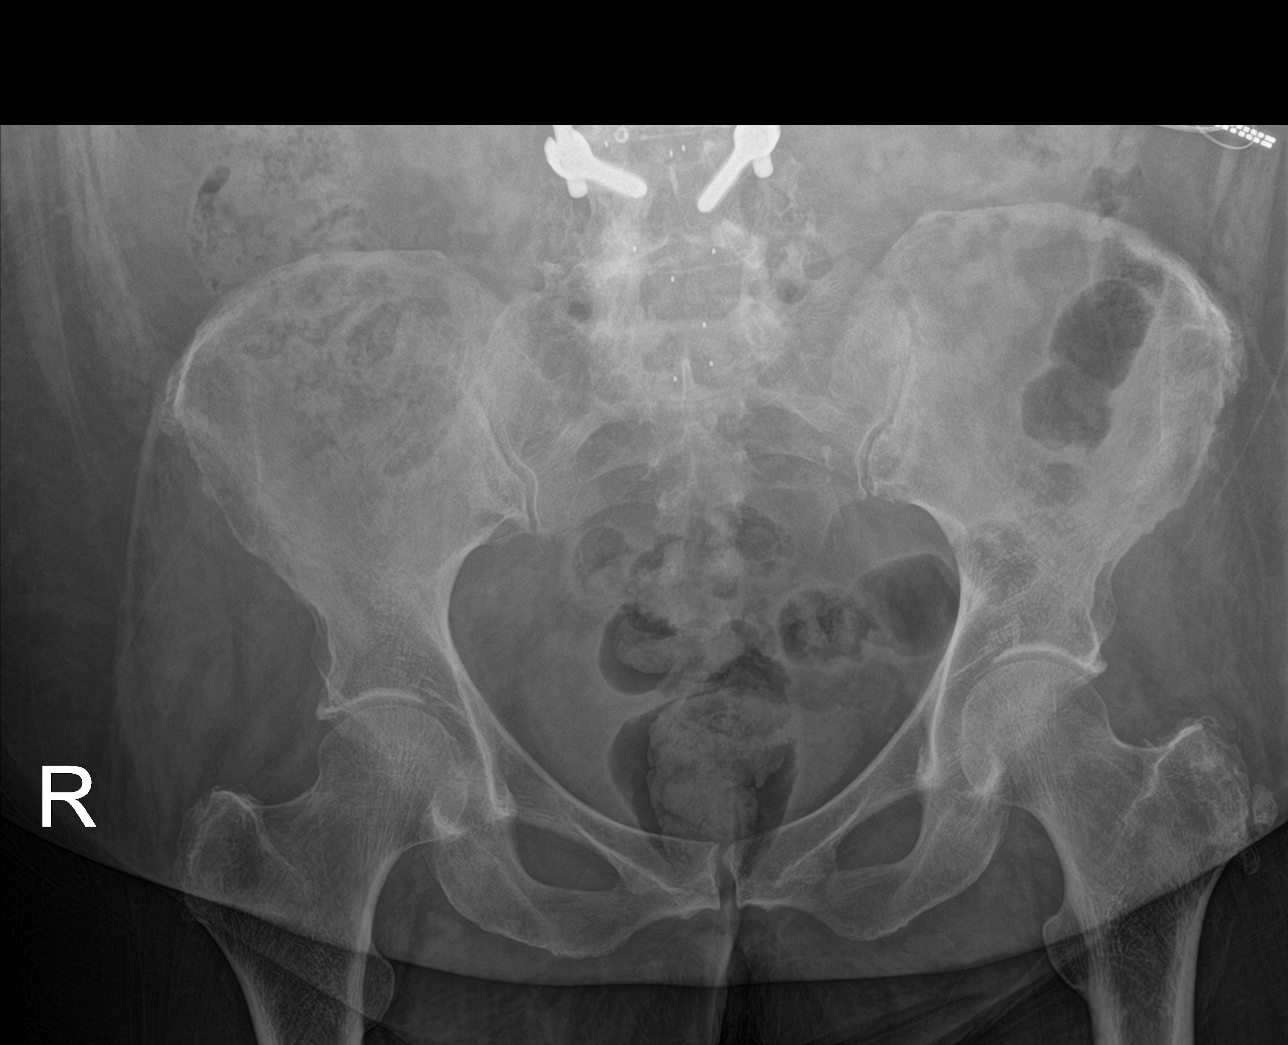

[hip ap]
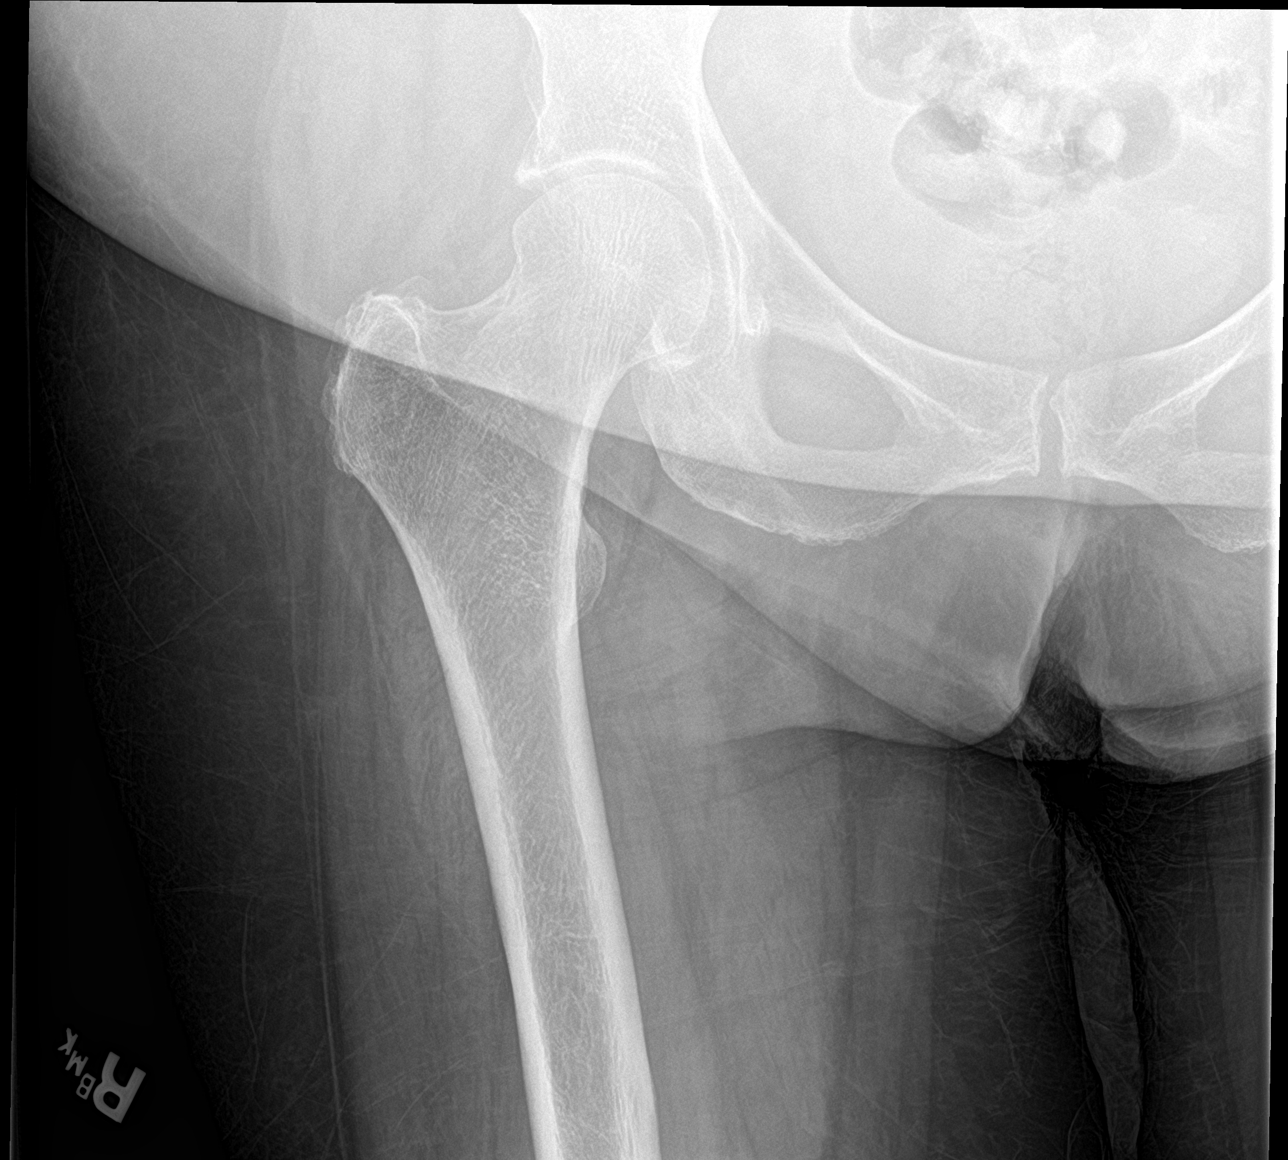

[hip lat]
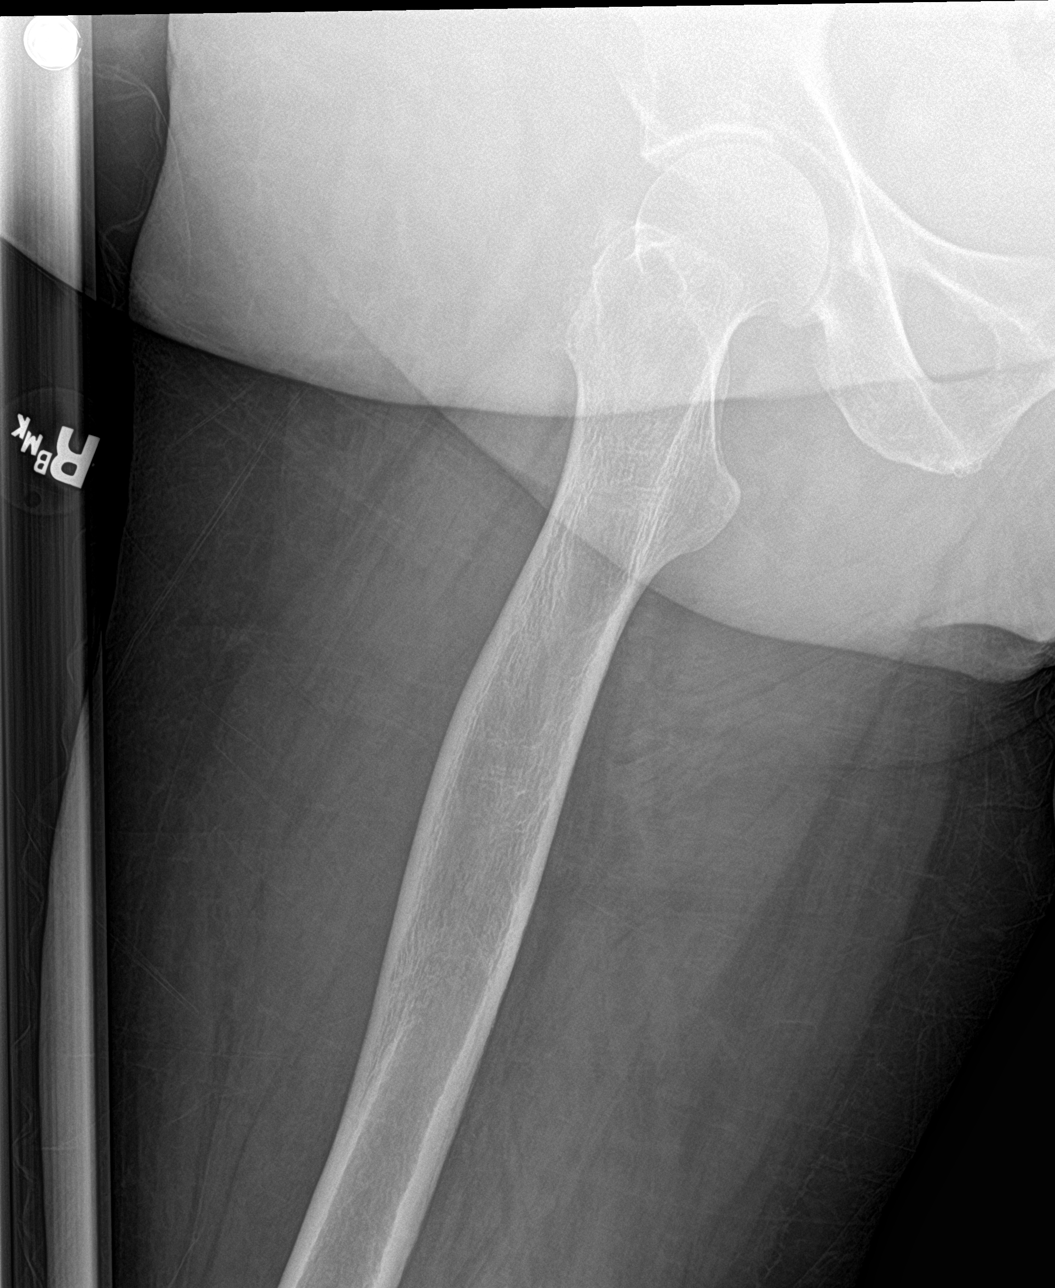

[3 of 3 positions shown; findings below may reference images not displayed]

FINDINGS: There is no evidence of hip fracture or dislocation. L3-4 bilateral
pedicle screw fixation with interbody block noted. Lower lumbar
facet arthropathy L4-5 bilaterally. The SI joints and pubic
symphysis are maintained. No pelvic fracture. Hip joints are intact
with slight joint space narrowing bilaterally.
IMPRESSION: No acute radiographic abnormality of the bony pelvis and right hip.

L3-4 pedicle screw fixation with interbody block.

## 2017-03-04 IMAGING — CR DG ANKLE COMPLETE 3+V*R*
3 series · 4 of 4 positions shown · non-contrast
Comparison: None.

CLINICAL DATA: Right ankle pain after fall from chair

EXAM:
RIGHT ANKLE - COMPLETE 3+ VIEW

[Series 1: ankle ap · 0.14mm/px · 2 of 2 slices shown]
[im 1/2]
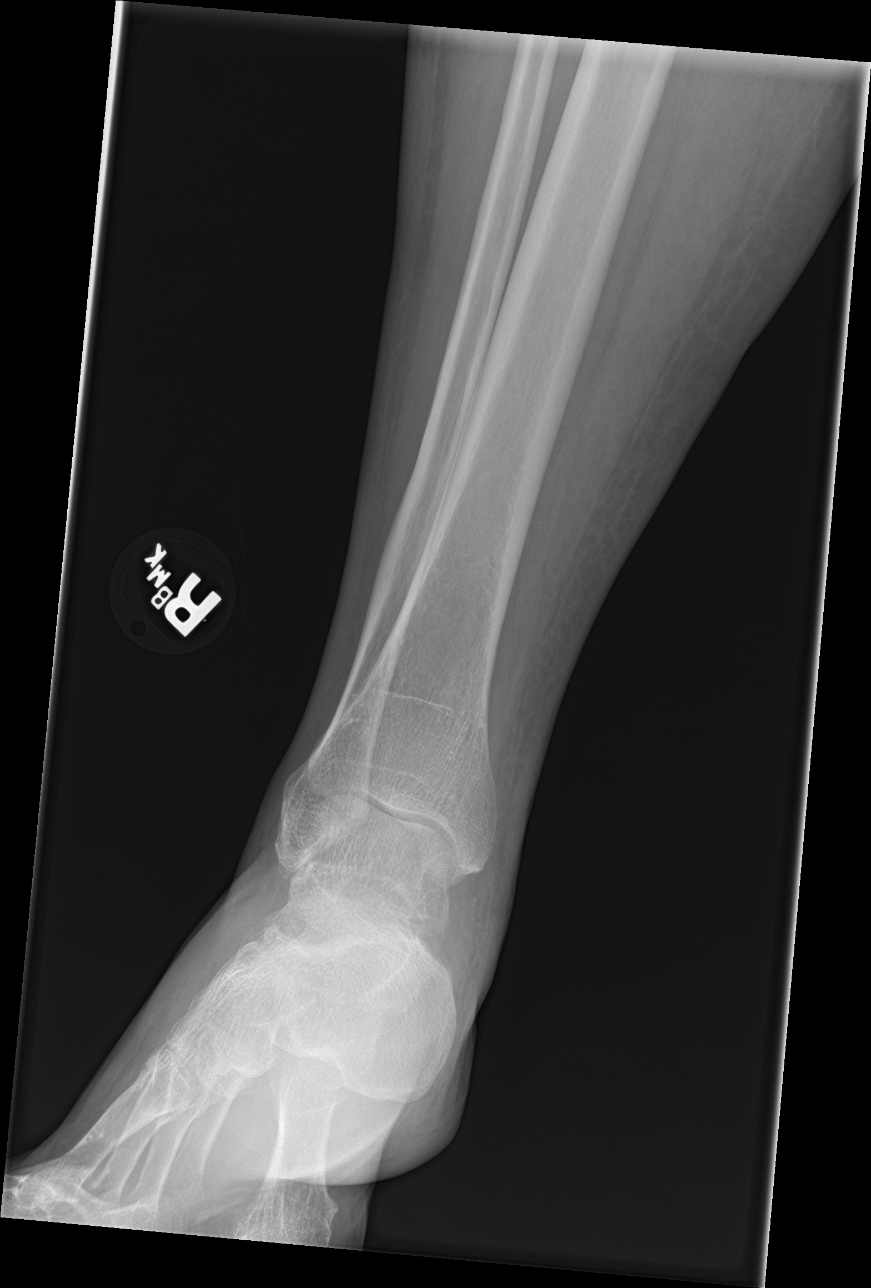
[im 2/2]
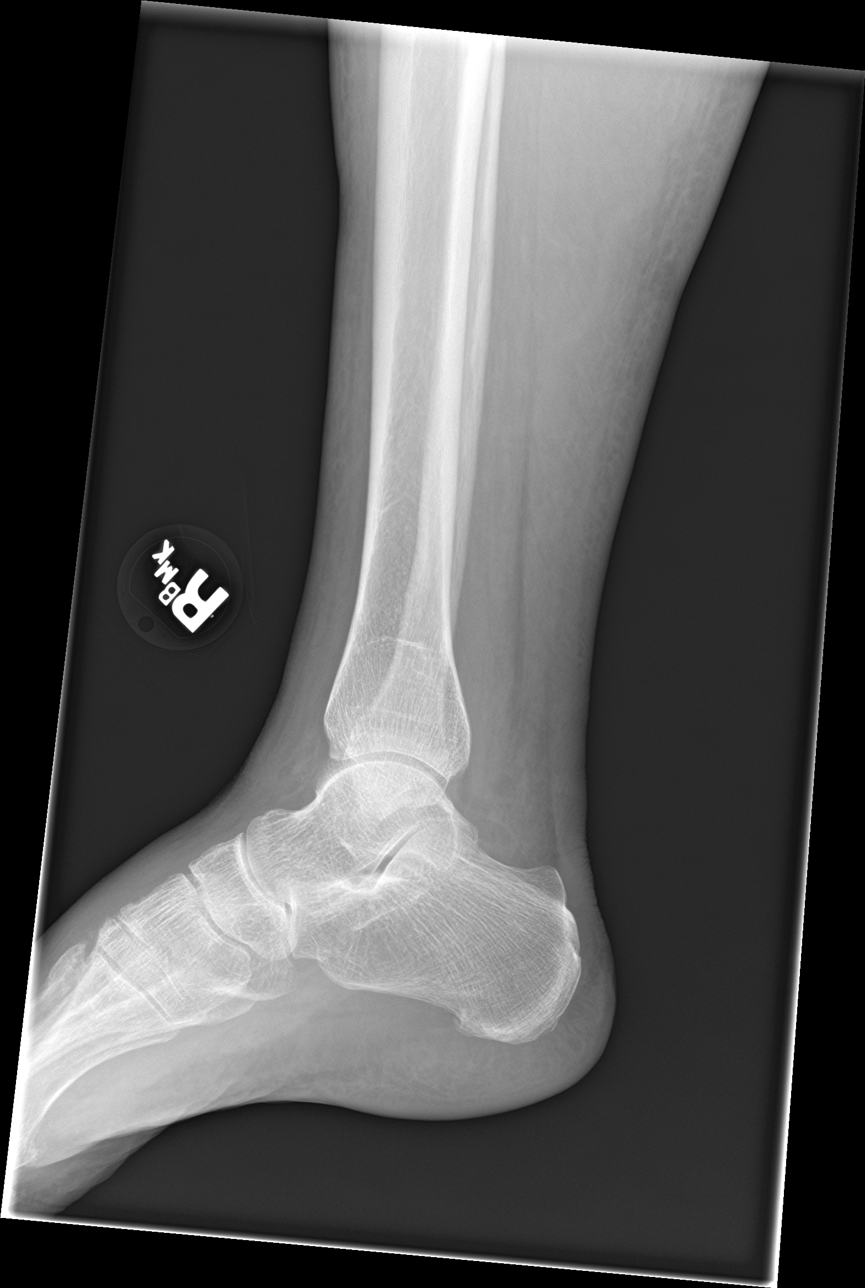

[ankle obl]
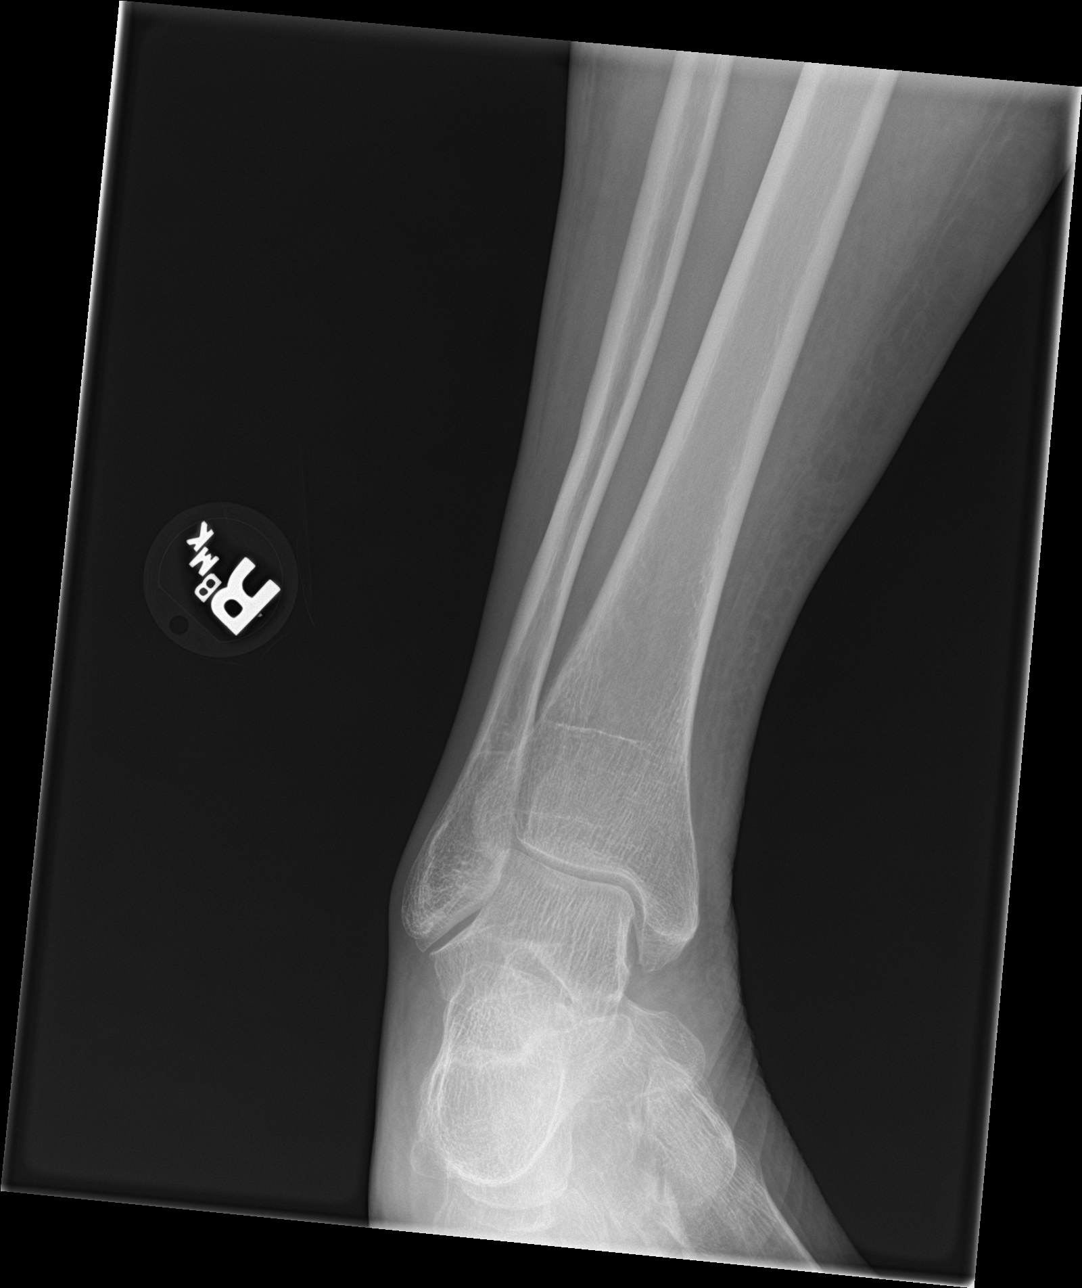

[ankle lat]
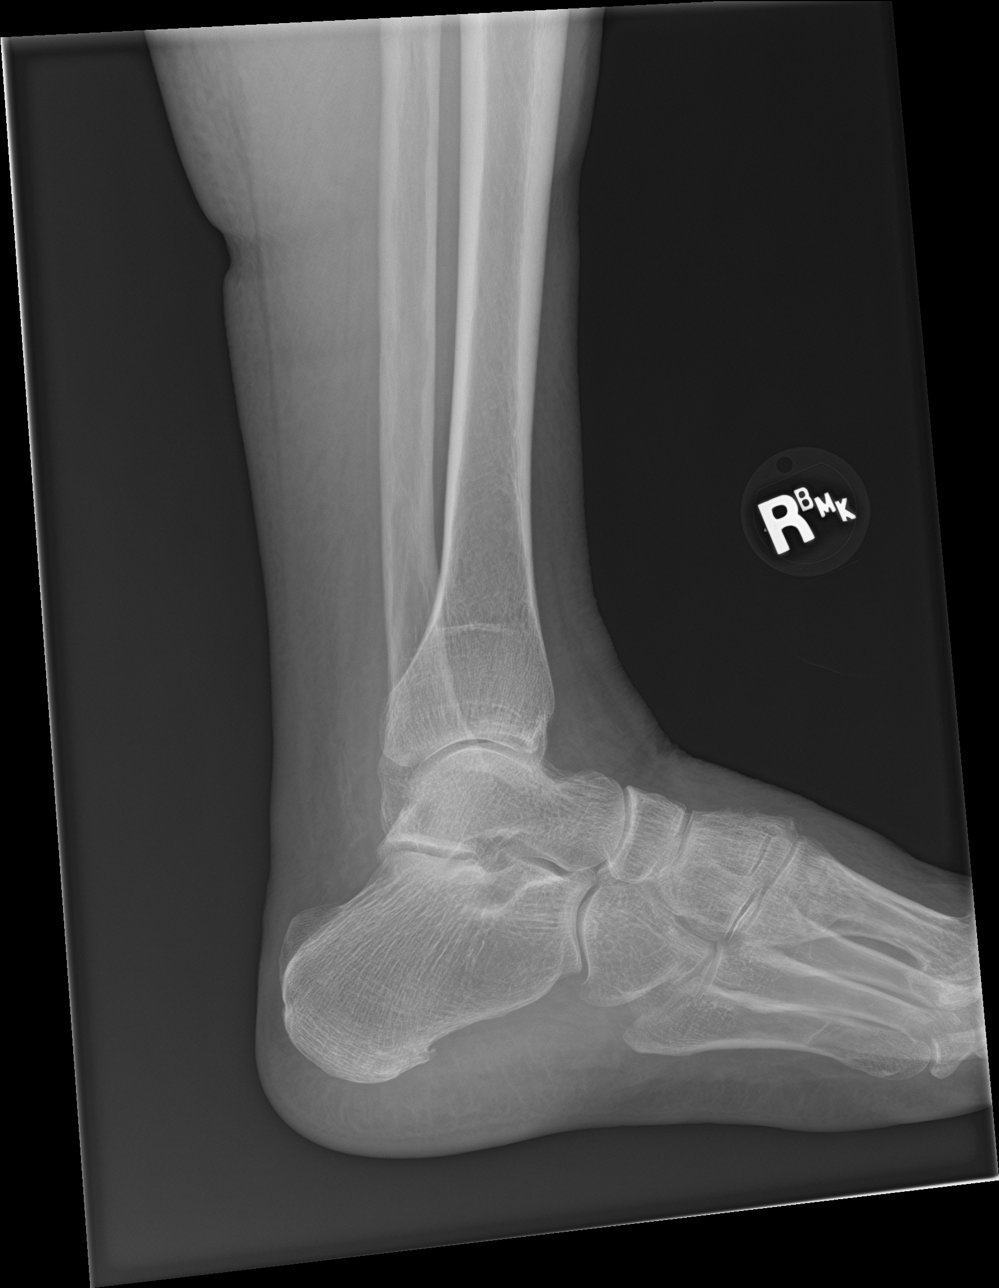

[4 of 4 positions shown; findings below may reference images not displayed]

FINDINGS: There is mild soft tissue swelling about the leg, malleoli and
dorsum of the midfoot. No ankle joint effusion. The ankle mortise is
maintained. The subtalar joint is unremarkable. Growth arrest lines
of the distal tibial diaphysis. There is a small plantar calcaneal
enthesophyte. On the lateral view, there is cortical irregularity of
a metatarsal bone incompletely included with sclerotic appearing
margins suggesting old remote trauma. If the patient is symptomatic
over the midfoot, would recommend dedicated foot radiographs for
better assessment.
IMPRESSION: No acute displaced appearing fracture. Soft tissue swelling of the
leg, malleoli and midfoot.

On the lateral projection, there is deformity of one of the
metarsals with sclerotic appearing margins suggestive of an old
remote injury though not confirmed on the additional views. If the
patient however is symptomatic over the midfoot, dedicated right
foot radiographs are recommended for better assessment.

## 2017-03-04 IMAGING — CR DG THORACIC SPINE 2V
3 series · 3 of 3 positions shown · non-contrast
Comparison: CT 08/23/2016.

CLINICAL DATA: Patient fell off of a chair.  Pain.

EXAM:
THORACIC SPINE 2 VIEWS

[t-spine ap]
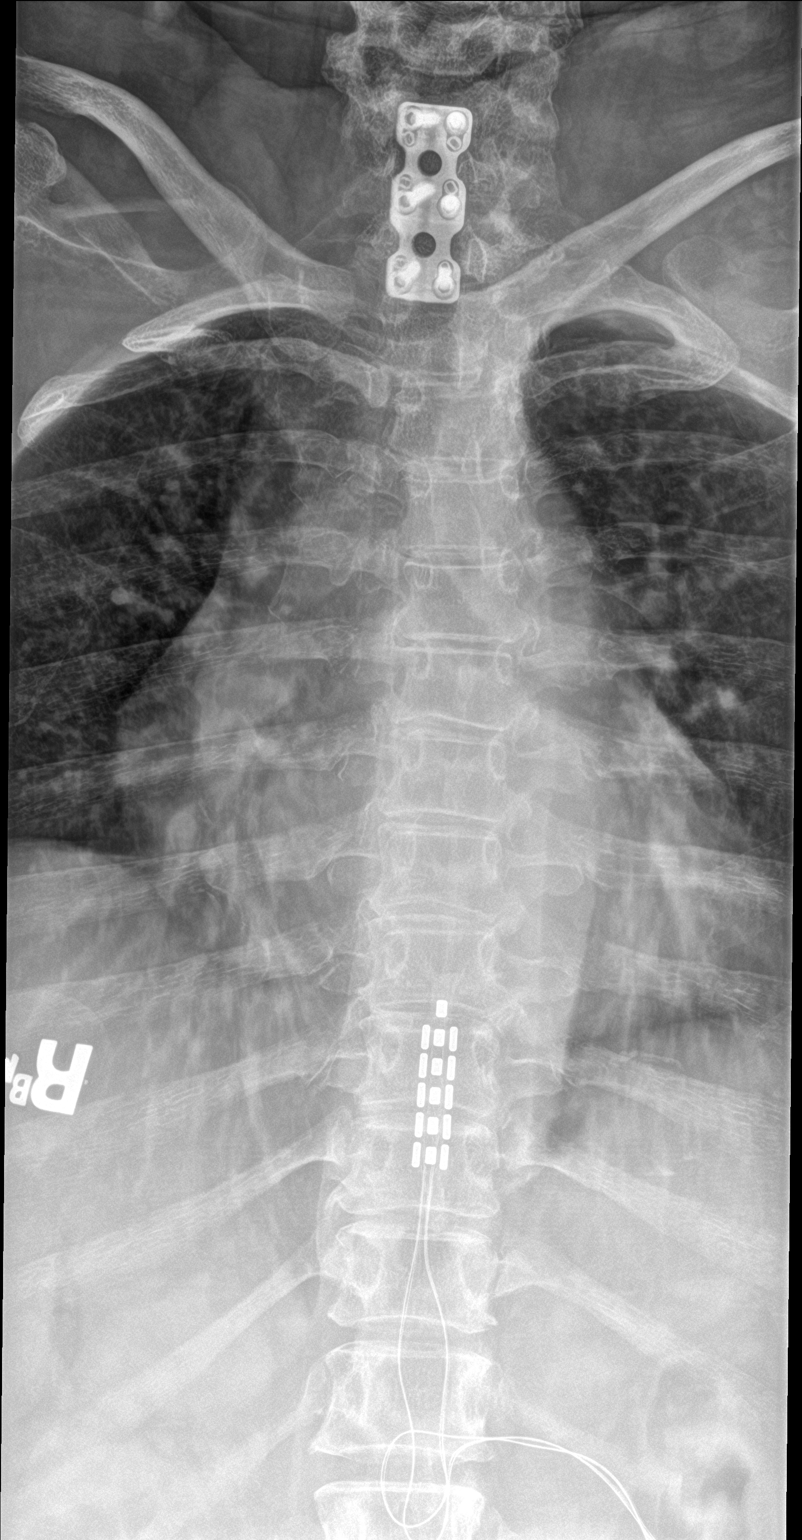

[t-spine lat]
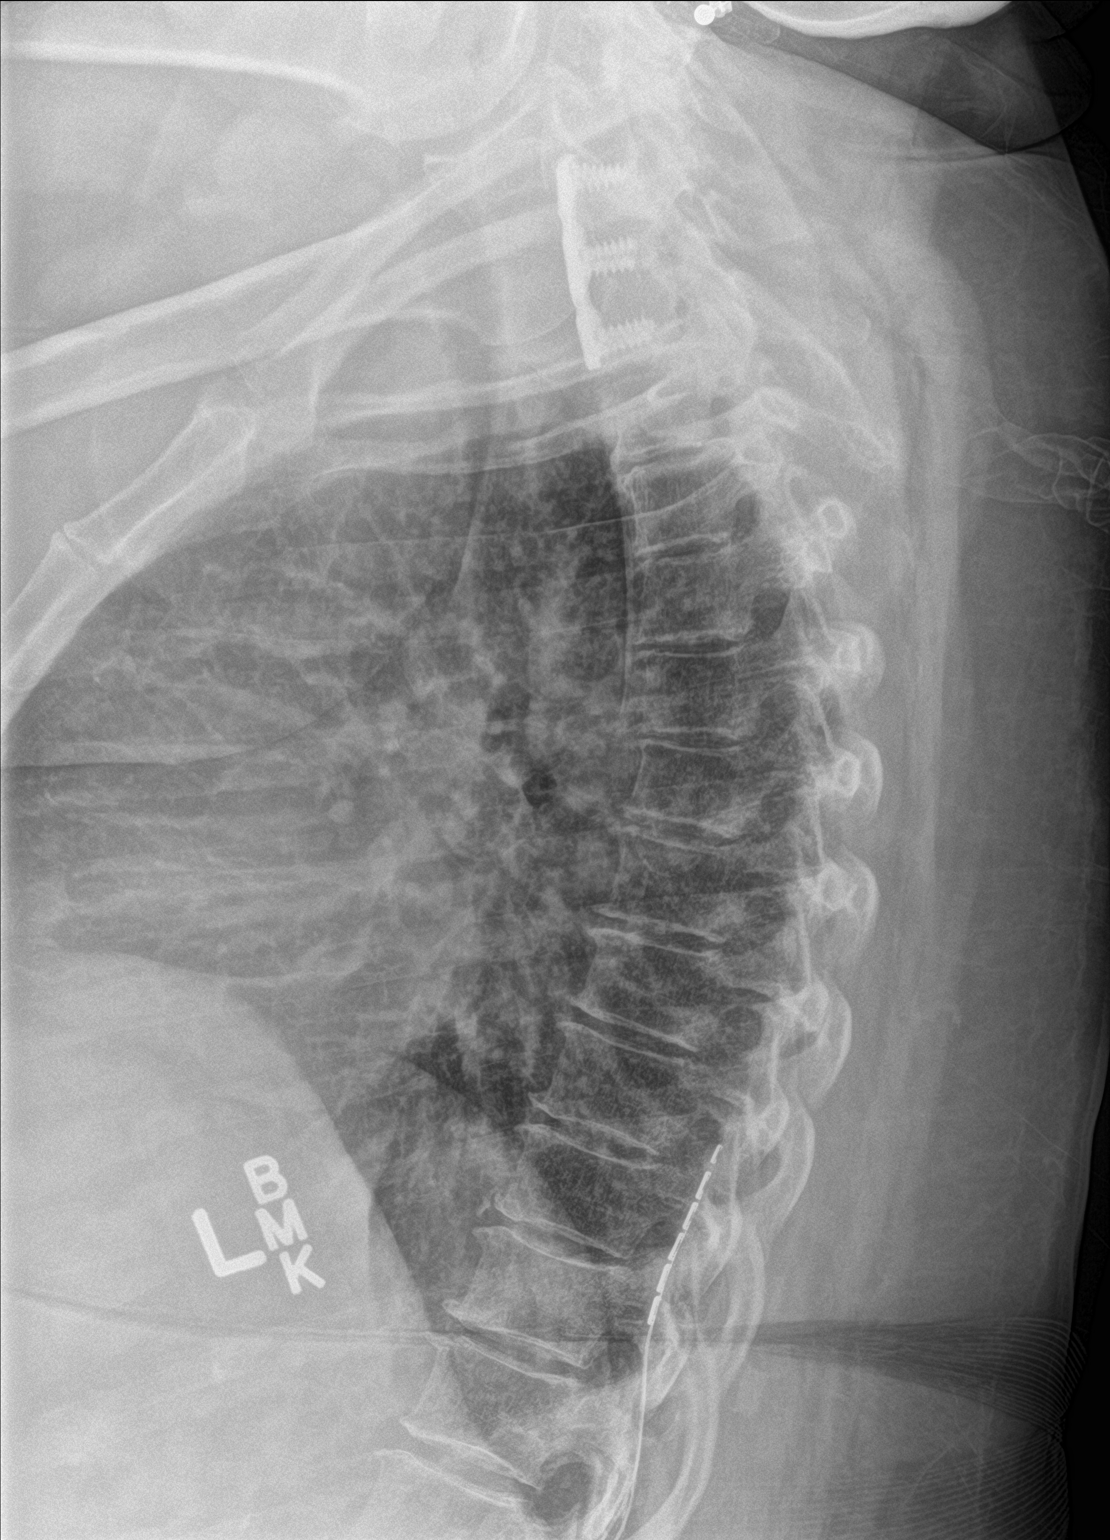

[t-spine swimmers]
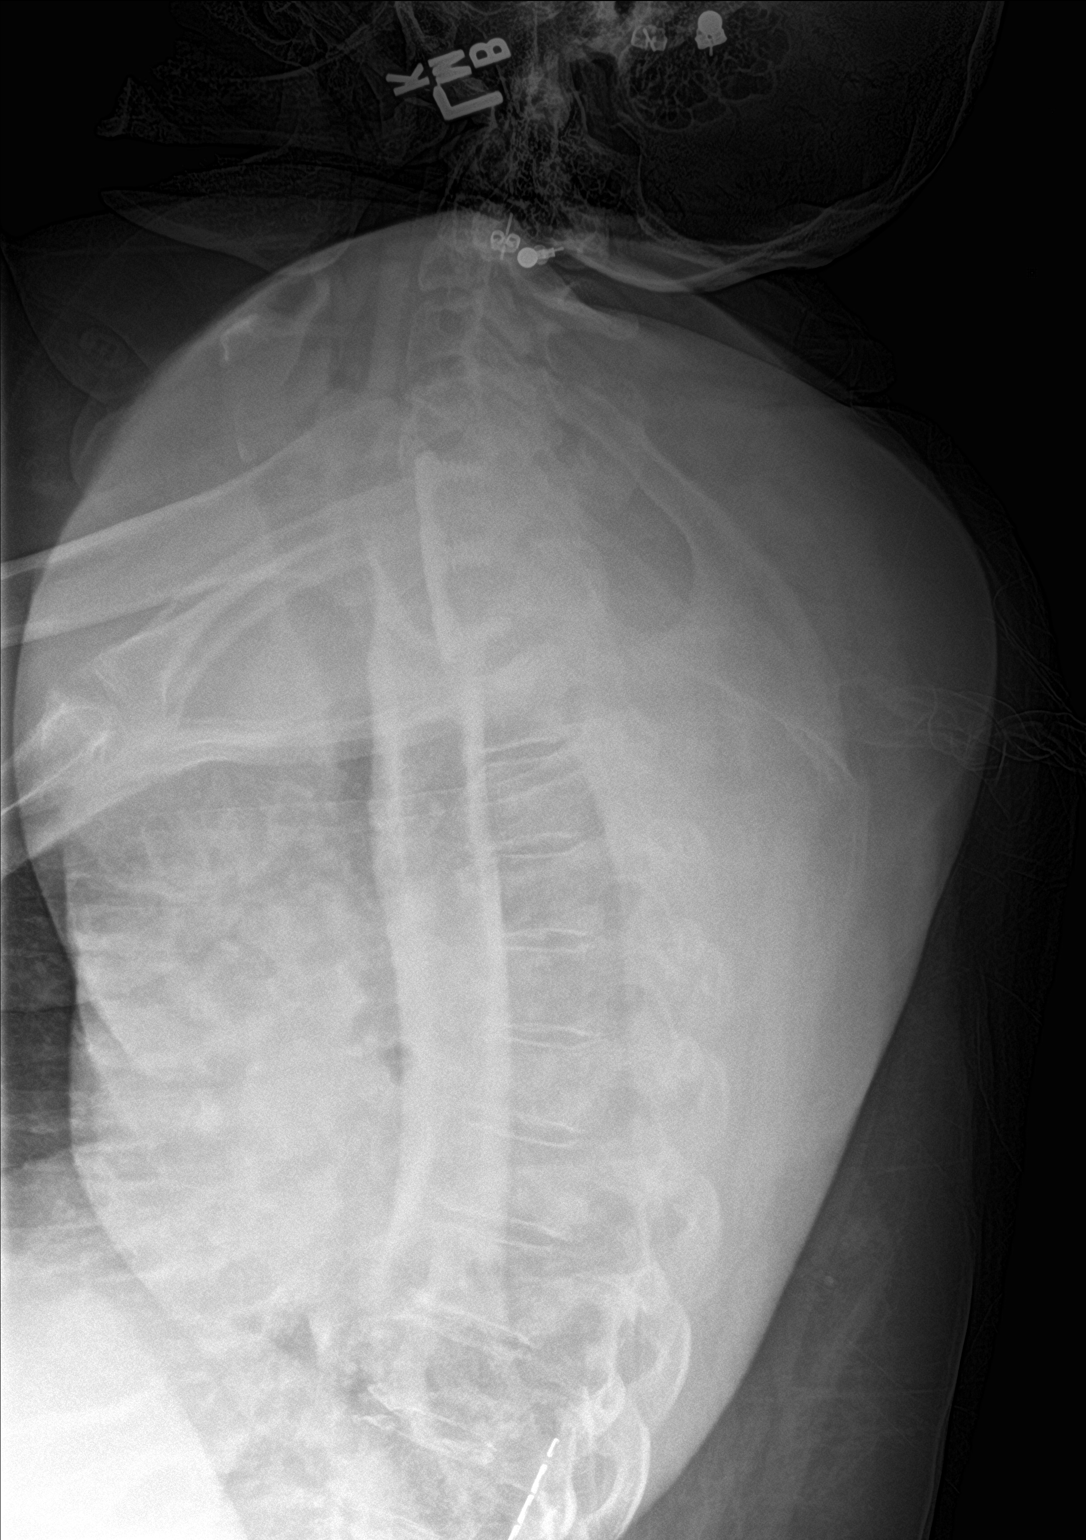

[3 of 3 positions shown; findings below may reference images not displayed]

FINDINGS: Neural stimulator device projects up to the T8-T9 disc level. No
acute fracture of the thoracic spine. Small anterior osteophytes are
noted mild disc space narrowing. The patient is status post low
anterior cervical disc fusion from C5 through C7.
IMPRESSION: No acute osseous abnormality the thoracic spine. Neural stimulator
device projects up to the T8-T9 level. ACDF of the lower cervical
spine.

## 2017-03-07 ENCOUNTER — Emergency Department: Payer: Medicare Other

## 2017-03-07 ENCOUNTER — Observation Stay
Admission: EM | Admit: 2017-03-07 | Discharge: 2017-03-11 | Disposition: A | Discharge status: Skilled Nursing Facility | Payer: Medicare Other | Attending: Specialist | Admitting: Specialist

## 2017-03-07 ENCOUNTER — Encounter: Payer: Self-pay | Admitting: Emergency Medicine

## 2017-03-07 ENCOUNTER — Other Ambulatory Visit: Payer: Self-pay

## 2017-03-07 ENCOUNTER — Ambulatory Visit (INDEPENDENT_AMBULATORY_CARE_PROVIDER_SITE_OTHER): Payer: Medicare Other | Admitting: Gastroenterology

## 2017-03-07 ENCOUNTER — Encounter: Payer: Self-pay | Admitting: Gastroenterology

## 2017-03-07 VITALS — BP 103/64 | HR 77 | Ht 63.0 in | Wt 220.0 lb

## 2017-03-07 DIAGNOSIS — K7030 Alcoholic cirrhosis of liver without ascites: Secondary | ICD-10-CM | POA: Insufficient documentation

## 2017-03-07 DIAGNOSIS — W19XXXA Unspecified fall, initial encounter: Secondary | ICD-10-CM | POA: Insufficient documentation

## 2017-03-07 DIAGNOSIS — R4182 Altered mental status, unspecified: Principal | ICD-10-CM | POA: Diagnosis present

## 2017-03-07 DIAGNOSIS — E785 Hyperlipidemia, unspecified: Secondary | ICD-10-CM | POA: Insufficient documentation

## 2017-03-07 DIAGNOSIS — S83207A Unspecified tear of unspecified meniscus, current injury, left knee, initial encounter: Secondary | ICD-10-CM | POA: Insufficient documentation

## 2017-03-07 DIAGNOSIS — J45909 Unspecified asthma, uncomplicated: Secondary | ICD-10-CM | POA: Diagnosis not present

## 2017-03-07 DIAGNOSIS — G2581 Restless legs syndrome: Secondary | ICD-10-CM | POA: Diagnosis not present

## 2017-03-07 DIAGNOSIS — F419 Anxiety disorder, unspecified: Secondary | ICD-10-CM | POA: Insufficient documentation

## 2017-03-07 DIAGNOSIS — Z794 Long term (current) use of insulin: Secondary | ICD-10-CM | POA: Insufficient documentation

## 2017-03-07 DIAGNOSIS — Z79899 Other long term (current) drug therapy: Secondary | ICD-10-CM | POA: Insufficient documentation

## 2017-03-07 DIAGNOSIS — Z8673 Personal history of transient ischemic attack (TIA), and cerebral infarction without residual deficits: Secondary | ICD-10-CM | POA: Diagnosis not present

## 2017-03-07 DIAGNOSIS — K219 Gastro-esophageal reflux disease without esophagitis: Secondary | ICD-10-CM | POA: Diagnosis not present

## 2017-03-07 DIAGNOSIS — K7460 Unspecified cirrhosis of liver: Secondary | ICD-10-CM | POA: Diagnosis not present

## 2017-03-07 DIAGNOSIS — E1140 Type 2 diabetes mellitus with diabetic neuropathy, unspecified: Secondary | ICD-10-CM | POA: Insufficient documentation

## 2017-03-07 DIAGNOSIS — Z7982 Long term (current) use of aspirin: Secondary | ICD-10-CM | POA: Diagnosis not present

## 2017-03-07 DIAGNOSIS — Z66 Do not resuscitate: Secondary | ICD-10-CM | POA: Diagnosis not present

## 2017-03-07 DIAGNOSIS — S72402A Unspecified fracture of lower end of left femur, initial encounter for closed fracture: Secondary | ICD-10-CM | POA: Insufficient documentation

## 2017-03-07 DIAGNOSIS — G4730 Sleep apnea, unspecified: Secondary | ICD-10-CM | POA: Insufficient documentation

## 2017-03-07 DIAGNOSIS — G4733 Obstructive sleep apnea (adult) (pediatric): Secondary | ICD-10-CM | POA: Insufficient documentation

## 2017-03-07 DIAGNOSIS — F319 Bipolar disorder, unspecified: Secondary | ICD-10-CM | POA: Insufficient documentation

## 2017-03-07 DIAGNOSIS — D509 Iron deficiency anemia, unspecified: Secondary | ICD-10-CM | POA: Insufficient documentation

## 2017-03-07 DIAGNOSIS — Z87891 Personal history of nicotine dependence: Secondary | ICD-10-CM | POA: Insufficient documentation

## 2017-03-07 DIAGNOSIS — Z7951 Long term (current) use of inhaled steroids: Secondary | ICD-10-CM | POA: Diagnosis not present

## 2017-03-07 LAB — COMPREHENSIVE METABOLIC PANEL
ALBUMIN: 3.7 g/dL (ref 3.5–5.0)
ALK PHOS: 259 U/L — AB (ref 38–126)
ALT: 31 U/L (ref 14–54)
ANION GAP: 9 (ref 5–15)
AST: 35 U/L (ref 15–41)
BUN: 17 mg/dL (ref 6–20)
CALCIUM: 9 mg/dL (ref 8.9–10.3)
CO2: 29 mmol/L (ref 22–32)
CREATININE: 0.87 mg/dL (ref 0.44–1.00)
Chloride: 102 mmol/L (ref 101–111)
GFR calc Af Amer: >60 mL/min (ref >60)
GFR calc non Af Amer: >60 mL/min (ref >60)
GLUCOSE: 100 mg/dL — AB (ref 65–99)
Potassium: 3.8 mmol/L (ref 3.5–5.1)
SODIUM: 140 mmol/L (ref 135–145)
Total Bilirubin: 1.1 mg/dL (ref 0.3–1.2)
Total Protein: 7.6 g/dL (ref 6.5–8.1)

## 2017-03-07 LAB — DIFFERENTIAL
BASOS PCT: 1 %
Basophils Absolute: 0 10*3/uL (ref 0–0.1)
EOS ABS: 0.1 10*3/uL (ref 0–0.7)
EOS PCT: 2 %
Lymphocytes Relative: 13 %
Lymphs Abs: 1 10*3/uL (ref 1.0–3.6)
Monocytes Absolute: 0.7 10*3/uL (ref 0.2–0.9)
Monocytes Relative: 10 %
NEUTROS PCT: 74 %
Neutro Abs: 5.7 10*3/uL (ref 1.4–6.5)

## 2017-03-07 LAB — CBC
HCT: 37.9 % (ref 35.0–47.0)
HEMOGLOBIN: 13.1 g/dL (ref 12.0–16.0)
MCH: 32.2 pg (ref 26.0–34.0)
MCHC: 34.6 g/dL (ref 32.0–36.0)
MCV: 93 fL (ref 80.0–100.0)
Platelets: 110 10*3/uL — ABNORMAL LOW (ref 150–440)
RBC: 4.08 MIL/uL (ref 3.80–5.20)
RDW: 15.7 % — ABNORMAL HIGH (ref 11.5–14.5)
WBC: 7.4 10*3/uL (ref 3.6–11.0)

## 2017-03-07 LAB — URINALYSIS, ROUTINE W REFLEX MICROSCOPIC
Bilirubin Urine: NEGATIVE
Glucose, UA: NEGATIVE mg/dL
Hgb urine dipstick: NEGATIVE
KETONES UR: NEGATIVE mg/dL
LEUKOCYTES UA: NEGATIVE
NITRITE: NEGATIVE
PROTEIN: NEGATIVE mg/dL
Specific Gravity, Urine: 1.01 (ref 1.005–1.030)
pH: 5 (ref 5.0–8.0)

## 2017-03-07 LAB — TROPONIN I

## 2017-03-07 LAB — GLUCOSE, CAPILLARY
GLUCOSE-CAPILLARY: 87 mg/dL (ref 65–99)
GLUCOSE-CAPILLARY: 109 mg/dL — AB (ref 65–99)

## 2017-03-07 LAB — APTT: APTT: 37 s — AB (ref 24–36)

## 2017-03-07 LAB — PROTIME-INR
INR: 1
Prothrombin Time: 13.1 seconds (ref 11.4–15.2)

## 2017-03-07 LAB — AMMONIA: AMMONIA: 39 umol/L — AB (ref 9–35)

## 2017-03-07 MED ORDER — IOPAMIDOL (ISOVUE-370) INJECTION 76%
75.0000 mL | Freq: Once | INTRAVENOUS | Status: AC | PRN
  Administered 2017-03-07: 75 mL via INTRAVENOUS

## 2017-03-07 NOTE — Progress Notes (Signed)
Gastroenterology Consultation  Referring Provider:     Dr. Mike Gip Primary Care Physician:  Sarajane Jews, MD Primary Gastroenterologist:  Dr. Allen Norris     Reason for Consultation:     Non-alcoholic cirrhosis.        HPI:   Stephanie [name] is a 66 y.o. y/o female referred for consultation & management of Nonalcoholic cirrhosis by Dr. Sarajane Jews, MD.  This patient has been sent to me for transfer of care after being seen in [facility] for cirrhosis. The patient reports that she has had cirrhosis for some time.  She denies any recent changes in her health in regards to her cirrhosis.  She also reports that her cirrhosis is due to fatty liver.  The patient states that her brother had the same thing and had multiple surgeries on his liver but she is not sure why.  The patient states she suffers from diabetes and hypertriglyceridemia in addition to obesity.  There is no report of any black stools or bloody stools.  The patient also denies any nausea vomiting fevers or chills.  Past Medical History:  Diagnosis Date  . Adenomatous colon polyp 08/27/2014   Polyps x 3  . Anemia   . Anxiety   . 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 Arthritis   . Asthma   . Bipolar affective disorder ([place])    h/o  . Blood transfusion 2000  . Blood transfusion without reported diagnosis   . Cancer ([place])   . Chronic abdominal pain    abdominal wall pain  . 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 Colon polyp   . Depression   . Diabetes mellitus without complication ([place])   . Fatty liver 04/09/08   found in abd CT  . GERD (gastroesophageal reflux disease)   . H/O: CVA (cardiovascular accident)    TIA  . H/O: rheumatic fever   . Hepatitis    HEP "B" twenty years ago  . Hyperlipidemia   . Incontinence   . Insomnia   . Lower extremity edema   . Obstructive sleep apnea    Use C-PAP twice per week  . Platelets decreased ([place])   . RLS (restless legs syndrome)   . Shortness of breath   . Sleep apnea   .  Stroke [facility])    TIA- 2002  . Ulcer   . Unspecified disorders of nervous system     Past Surgical History:  Procedure Laterality Date  . ABDOMINAL EXPLORATION SURGERY    . ABDOMINAL HYSTERECTOMY     total  . Axillary artery cannulation via 8-mm Hemashield graft, median sternotomy, extracorporeal circulation with deep hypothermic circulatory arrest, repair of  aortic dessection  01/23/2009   Dr Arlyce Dice  . BACK SURGERY      x 5  . cataract     both eyes  . CHOLECYSTECTOMY    . COLONOSCOPY    . EYE SURGERY Bilateral    Cataract Extraction with IOL  . HEMORROIDECTOMY     and colon polyp removed  . KNEE ARTHROSCOPY WITH LATERAL RELEASE Right 11/15/2016   Procedure: KNEE ARTHROSCOPY WITH LATERAL RELEASE;  Surgeon: Corky Mull, MD;  Location: [facility] ORS;  Service: Orthopedics;  Laterality: Right;  . LIVER BIOPSY  04/10/2012   Procedure: LIVER BIOPSY;  Surgeon: Inda Castle, MD;  Location: [facility];  Service: Endoscopy;  Laterality: N/A;  ultrasound to mark order for abd limited/liver to be marked to be  sent by [email or web address]   . LUMBAR FUSION  10/09  . LUMBAR WOUND DEBRIDEMENT  05/09/2011   Procedure: LUMBAR WOUND DEBRIDEMENT;  Surgeon: Dahlia Bailiff;  Location: [place];  Service: Orthopedics;  Laterality: N/A;  IRRIGATION AND DEBRIDEMENT SPINAL WOUND  . POSTERIOR CERVICAL FUSION/FORAMINOTOMY    . ROTATOR CUFF REPAIR     left    Prior to Admission medications   Medication Sig Start Date End Date Taking? Authorizing Provider  acetaminophen (TYLENOL) 325 MG tablet Take 650 mg by mouth every 4 (four) hours as needed (for fever, minor discomfort, or headache).    Yes [provider]  albuterol (PROVENTIL HFA;VENTOLIN HFA) 108 (90 Base) MCG/ACT inhaler Inhale 2 puffs into the lungs every 6 (six) hours as needed for wheezing or shortness of breath. 07/28/16  Yes Alfred Levins, [place], MD  albuterol (PROVENTIL) (2.5 MG/3ML) 0.083% nebulizer solution Take 2.5 mg by nebulization every 6  (six) hours as needed for wheezing or shortness of breath.   Yes [provider]  ALPRAZolam Duanne Moron) 0.25 MG tablet Take by mouth.   Yes [provider]  Alum & Mag Hydroxide-Simeth (ALMACONE PO) Take 30 mLs by mouth every 6 (six) hours as needed.   Yes [provider]  aspirin 325 MG tablet Take by mouth.   Yes [provider]  atorvastatin (LIPITOR) 40 MG tablet  02/05/17  Yes [provider]  barrier cream (NON-SPECIFIED) CREA Apply 1 application topically as needed (APPLIED AFTER TOILETING TO PREVENT SKIN BREAKDOWN).   Yes [provider]  budesonide-formoterol (SYMBICORT) 160-4.5 MCG/ACT inhaler Inhale 2 puffs into the lungs 2 (two) times daily. (0800 & 2000)   Yes [provider]  buPROPion (WELLBUTRIN SR) 150 MG 12 hr tablet Take 150 mg by mouth daily. (0800)   Yes [provider]  clonazePAM (KLONOPIN) 0.25 MG disintegrating tablet 0.25 mg 3 (three) times daily.  01/12/17  Yes [provider]  clonazePAM (KLONOPIN) 0.5 MG tablet Take 0.25 mg by mouth 3 (three) times daily. (0800, 1300 & 2000)   Yes [provider]  dicyclomine (BENTYL) 10 MG capsule Take 10 mg by mouth 3 (three) times daily. (0800, 1300 & 2000)   Yes [provider]  diphenhydrAMINE (DIPHENHIST) 25 mg capsule Take 25 mg by mouth every 6 (six) hours as needed (for allergic reaction (notify provider is symptoms persist more than 24 hrs*).   Yes [provider]  Emollient (EUCERIN INTENSIVE REPAIR) LOTN Apply 1 application topically 2 (two) times daily.   Yes [provider]  ferrous sulfate 325 (65 FE) MG tablet Take 325 mg by mouth 2 (two) times daily. (0800 & 2000)   Yes [provider]  furosemide (LASIX) 80 MG tablet Take 80 mg by mouth daily. (0800)   Yes [provider]  gabapentin (NEURONTIN) 300 MG capsule  02/04/17  Yes [provider]  gabapentin (NEURONTIN) 600 MG tablet Take 600  mg by mouth 2 (two) times daily. (0800 & 2000)   Yes [provider]  guaifenesin (ROBITUSSIN) 100 MG/5ML syrup Take 300 mg by mouth every 6 (six) hours as needed for cough.    Yes [provider]  insulin glargine (LANTUS) 100 UNIT/ML injection Inject 28 Units into the skin at bedtime.   Yes [provider]  insulin lispro (HUMALOG KWIKPEN) 100 UNIT/ML KiwkPen Inject 0-9 Units into the skin 3 (three) times daily. Check FSBS before each meal & cover with SSI (sliding scale insulin)   Yes [provider]  lactulose (CHRONULAC) 10 GM/15ML solution Take 10 g by mouth 3 (three) times a week. 10/17/16  Yes [provider]  lamoTRIgine (LAMICTAL) 100 MG tablet Take 100 mg by mouth 2 (two) times daily. (0800 & 2000)   Yes [provider]  lidocaine (LIDODERM) 5 % Place 3 patches onto the skin daily.  02/01/17  Yes [provider]  loperamide (IMODIUM) 2 MG capsule Take 4 mg by mouth as needed for diarrhea or loose stools. *Do not exceed 8 doses in 24 hours*   Yes [provider]  LORazepam (ATIVAN) 0.5 MG tablet Take 0.5 mg by mouth every 8 (eight) hours.   Yes [provider]  LYRICA 150 MG capsule Take 150 mg by mouth 3 (three) times daily. 11/29/16  Yes [provider]  magnesium hydroxide (MILK OF MAGNESIA) 400 MG/5ML suspension Take 30 mLs by mouth daily as needed (for constipation).   Yes [provider]  Menthol-Zinc Oxide (GOLD BOND EX) Apply 1 application topically daily.   Yes [provider]  metoCLOPramide (REGLAN) 5 MG tablet Take 5 mg by mouth 4 (four) times daily -  before meals and at bedtime. (0800, 1200, 1600 & 2000)   Yes [provider]  mirtazapine (REMERON) 15 MG tablet Take 45 mg by mouth at bedtime. (2000)    Yes [provider]  mirtazapine (REMERON) 45 MG tablet  02/05/17  Yes [provider]  omeprazole (PRILOSEC) 20 MG capsule Take 20 mg by mouth daily  before breakfast. (0630)   Yes [provider]  ondansetron (ZOFRAN) 4 MG tablet Take 4 mg by mouth every 4 (four) hours as needed for nausea or vomiting.   Yes [provider]  oxybutynin (DITROPAN-XL) 5 MG 24 hr tablet Take 5 mg by mouth daily. (0800)   Yes [provider]  oxyCODONE (ROXICODONE) 5 MG immediate release tablet Take 1 tablet (5 mg total) by mouth every 4 (four) hours as needed for severe pain. 11/15/16  Yes Poggi, Marshall Cork, MD  pantoprazole (PROTONIX) 40 MG tablet  02/05/17  Yes [provider]  potassium chloride SA (K-DUR,KLOR-CON) 20 MEQ tablet Take 20 mEq by mouth daily. (0800)   Yes [provider]  pyrithione zinc (HEAD AND SHOULDERS) 1 % shampoo Apply 1 application topically daily. t-gel or head and shoulders.   Yes [provider]  rOPINIRole (REQUIP) 3 MG tablet Take 3 mg by mouth at bedtime. (2000)   Yes [provider]  simvastatin (ZOCOR) 10 MG tablet Take 10 mg by mouth at bedtime. (2000)   Yes [provider]  spironolactone (ALDACTONE) 25 MG tablet Take 25 mg by mouth daily. (0800)   Yes [provider]  traMADol (ULTRAM) 50 MG tablet Take 50 mg by mouth 2 (two) times daily. (0800 & 2000)   Yes [provider]  vitamin C (ASCORBIC ACID) 500 MG tablet Take 500 mg by mouth 2 (two) times daily. (0800 & 2000)   Yes [provider]  Water For Irrigation, Sterile (STERILE WATER FOR IRRIGATION) Irrigate with as directed at bedtime. Use with CPAP machine at bedtime during sleep & remove in the morning   Yes [provider]  Xylitol (XYLIMELTS) 500 MG DISK Use as directed 1 capsule in the mouth or throat 3 (three) times daily.   Yes [provider]  zolpidem (AMBIEN) 5 MG tablet Take 5 mg by mouth at bedtime. (2000)   Yes [provider]  diclofenac sodium (VOLTAREN) 1 % GEL Apply 4 g topically every 8 (eight) hours as needed (pain). Apply to right  knee Patient not taking: Reported on 02/02/2017 02/02/17   Gillis Santa, MD  tiZANidine (ZANAFLEX) 4 MG tablet Take 1 tablet (4 mg total) by mouth 2 (two) times daily as needed for muscle spasms. Patient not taking: Reported on 02/02/2017 02/02/17   Gillis Santa, MD    Family History  Problem Relation Age of Onset  . Breast cancer Mother   . Lung cancer Mother   . Stomach cancer Father   . Diabetes Unknown        4 aunts, and 1 uncle  . Colon cancer Neg Hx   . Esophageal cancer Neg Hx   . Rectal cancer Neg Hx      Social History  Substance Use Topics  . Smoking status: Former Smoker    Packs/day: 1.50    Years: 50.00    Types: Cigarettes    Quit date: 04/06/2013  . Smokeless tobacco: Never Used  . Alcohol use No    Allergies as of 03/07/2017 - Review Complete 02/15/2017  Allergen Reaction Noted  . Doxycycline Hives, Swelling, and Other (See Comments) 05/05/2014    Review of Systems:    All systems reviewed and negative except where noted in HPI.   Physical Exam:  BP 103/64   Pulse 77   Ht 5' 3"  (1.6 m)   Wt 220 lb (99.8 kg)   BMI 38.97 kg/m  No LMP recorded. Patient has had a hysterectomy. Psych:  Alert and cooperative. Normal mood and affect. General:   Alert,  Well-developed, Obese, pleasant and cooperative in NAD Head:  Normocephalic and atraumatic. Eyes:  Sclera clear, no icterus.   Conjunctiva pink. Ears:  Normal auditory acuity. Nose:  No deformity, discharge, or lesions. Mouth:  No deformity or lesions,oropharynx pink & moist. Neck:  Supple; no masses or thyromegaly. Lungs:  Respirations even and unlabored.  Clear throughout to auscultation.   No wheezes, crackles, or rhonchi. No acute distress. Heart:  Regular rate and rhythm; no murmurs, clicks, rubs, or gallops. Abdomen:  Normal bowel sounds.  No bruits.  Soft, non-tender and non-distended without masses, hepatosplenomegaly or hernias noted.  No guarding or rebound tenderness.  Negative Carnett sign.    Rectal:  Deferred.  Msk:  Symmetrical without gross deformities.  Good, equal movement & strength bilaterally. Pulses:  Normal pulses noted. Extremities:  No clubbing or edema.  No cyanosis. Neurologic:  Alert and oriented x3;  grossly normal neurologically. Skin:  Intact without significant lesions or rashes.  No jaundice. Lymph Nodes:  No significant cervical adenopathy. Psych:  Alert and cooperative. Normal mood and affect.  Imaging Studies: Dg Knee 2 Views Left  Result Date: 02/15/2017 CLINICAL DATA:  Left knee pain.  No known injury. EXAM: LEFT KNEE - 1-2 VIEW COMPARISON:  01/31/2016 FINDINGS: No evidence of fracture, dislocation, or joint effusion. No evidence of arthropathy or other focal bone abnormality. Soft tissues are unremarkable. IMPRESSION: Negative. Electronically Signed   By: Rolm Baptise M.D.   On: 02/15/2017 19:42   Mr Cervical Spine Wo Contrast  Result Date: 02/27/2017 CLINICAL DATA:  Right shoulder pain extending into the right elbow for 6 months. Injury 6 or 7 months ago. Previous right shoulder surgery. EXAM: MRI CERVICAL SPINE WITHOUT CONTRAST TECHNIQUE: Multiplanar, multisequence MR imaging of the cervical spine was performed. No intravenous contrast was administered. COMPARISON:  Cervical spine CT 07/14/2016. FINDINGS: Despite efforts by the  technologist and patient, mild motion artifact is present on today's exam and could not be eliminated. This reduces exam sensitivity and specificity. Alignment: Straightening without focal angulation or listhesis for Vertebrae: No acute or suspicious osseous findings. Status post C5-7 ACDF. Cord: Normal in signal and caliber. Posterior Fossa, vertebral arteries, paraspinal tissues: Visualized portions of the posterior fossa and paraspinal soft tissues appear unremarkable. Bilateral vertebral artery flow voids. Disc levels: C2-3: The disc appears normal. There is mild asymmetric facet hypertrophy on the left without resulting foraminal  compromise or exiting nerve root encroachment. C3-4: There is a shallow, broad-based central disc protrusion effacing the CSF surrounding the cord and narrowing the AP diameter of the canal to 9 mm. There is no cord deformity. There is asymmetric facet hypertrophy on the left contributing to moderate left foraminal narrowing. The right foramen appears patent. C4-5: Stable chronic spondylosis with loss of disc height, endplate osteophytes and asymmetric facet hypertrophy on the right. Stable chronic minimal right foraminal narrowing. No cord deformity. C5-6: The spinal canal and neural foramina appear well-decompressed status post ACDF. C6-7: The spinal canal and neural foramina appear well-decompressed status post ACDF. C7-T1: Mild facet hypertrophy, worse on the left. The disc appears normal. No spinal stenosis or nerve root encroachment. IMPRESSION: 1. No acute findings or clear explanation for the patient's symptoms. Detail limited by motion artifact and body habitus. 2. Stable findings from previous C5-7 ACDF. 3. Stable adjacent segment disease at C4-5 with minimal right foraminal narrowing, but no evidence of nerve root encroachment. 4. Moderate chronic foraminal narrowing on the left at C3-4 secondary to asymmetric facet hypertrophy. Associated borderline spinal stenosis without cord deformity. Electronically Signed   By: Richardean Sale M.D.   On: 02/27/2017 15:28   Mr Shoulder Right Wo Contrast  Result Date: 02/27/2017 CLINICAL DATA:  Pt states right shoulder pain down to right elbow x 6 months. Injury in Feb/March 2018 prior history of right shoulder surgery. EXAM: MRI OF THE RIGHT SHOULDER WITHOUT CONTRAST TECHNIQUE: Multiplanar, multisequence MR imaging of the shoulder was performed. No intravenous contrast was administered. COMPARISON:  None. FINDINGS: Rotator cuff: Mild tendinosis of the infraspinatus tendon. Mild tendinosis of the supraspinatus tendon with a small insertional interstitial tear.  Teres minor tendon is intact. Subscapularis tendon is intact. Muscles: No atrophy or fatty replacement of nor abnormal signal within, the muscles of the rotator cuff. Biceps long head:  Intact. Acromioclavicular Joint: Mild arthropathy of the acromioclavicular joint. Type I acromion. No subacromial/subdeltoid bursal fluid. Glenohumeral Joint: No joint effusion. Partial-thickness cartilage loss of the glenohumeral joint with small marginal osteophyte. Labrum: Grossly intact, but evaluation is limited by lack of intraarticular fluid. Bones: No marrow abnormality, fracture or dislocation. Old posttraumatic deformity from prior healed fracture of the surgical neck of the right proximal humerus. Other: No fluid collection or hematoma. IMPRESSION: 1. Mild tendinosis of the infraspinatus tendon. 2. Mild tendinosis of the supraspinatus tendon with a small insertional interstitial tear. 3. Mild osteoarthritis of the glenohumeral joint. Electronically Signed   By: Kathreen Devoid   On: 02/27/2017 14:50    Assessment and Plan:   Stephanie [name] is a 35 y.o. y/o female With a history of nonalcoholic cirrhosis.  The patient was followed in [place] in the past. The patient has been set up for a right upper quadrant ultrasound by hematology.  The patient has no set up.  The patient has thrombocytopenia from her cirrhosis and has a nodular liver on her last ultrasound.  There is  no report of any ascites on that ultrasound. The patient is now here to transfer care to me.  The patient will have her blood centile for alpha-fetoprotein.  She has also asked to be referred for evaluation for possible liver transplant.  The patient would like to be sent to [facility] for transplant evaluation.  Lucilla Lame, MD. Marval Regal   Note: This dictation was prepared with Dragon dictation along with smaller phrase technology. Any transcriptional errors that result from this process are unintentional.

## 2017-03-07 NOTE — ED Provider Notes (Signed)
[facility] Emergency Department Provider Note ____________________________________________   First MD Initiated Contact with Patient 03/07/17 2026     (approximate)  I have reviewed the triage vital signs and the nursing notes.   HISTORY  Chief Complaint Altered Mental Status  History of present illness severely limited due to altered mental status.  HPI Stephanie [name] is a 66 y.o. female with ast medical history as below who presents with altered mental status.  Per facility staff, patient was last seen normal at 1700, and then when he was rechecked a short time later she was noted to be unresponsive. Patient is unable to give any history.  per EMS, patient awoke and was responsive to smelling salt.   Past Medical History:  Diagnosis Date  . Adenomatous colon polyp 08/27/2014   Polyps x 3  . Anemia   . Anxiety   . 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 Arthritis   . Asthma   . Bipolar affective disorder ([place])    h/o  . Blood transfusion 2000  . Blood transfusion without reported diagnosis   . Cancer ([place])   . Chronic abdominal pain    abdominal wall pain  . 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 Colon polyp   . Depression   . Diabetes mellitus without complication ([place])   . Fatty liver 04/09/08   found in abd CT  . GERD (gastroesophageal reflux disease)   . H/O: CVA (cardiovascular accident)    TIA  . H/O: rheumatic fever   . Hepatitis    HEP "B" twenty years ago  . Hyperlipidemia   . Incontinence   . Insomnia   . Lower extremity edema   . Obstructive sleep apnea    Use C-PAP twice per week  . Platelets decreased ([place])   . RLS (restless legs syndrome)   . Shortness of breath   . Sleep apnea   . Stroke [facility])    TIA- 2002  . Ulcer   . Unspecified disorders of nervous system     Patient Active Problem List   Diagnosis Date Noted  . Degenerative tear of lateral meniscus of right knee 11/15/2016  . Encephalopathy 07/19/2016  .  Degenerative tear of medial meniscus of right knee 07/01/2016  . Primary osteoarthritis of right knee 07/01/2016  . Diabetes mellitus type 1, uncontrolled, without complications ([place]) 44/96/7591  . Lymphedema 06/14/2016  . Pain in limb 05/06/2016  . Swelling of limb 05/06/2016  . Postphlebitic syndrome with inflammation 05/06/2016  . Pneumonia 04/18/2016  . Chest pain 04/04/2016  . Iron deficiency anemia 01/30/2016  . Bilateral carotid artery stenosis 01/12/2016  . Hypoglycemia 09/09/2015  . Hyperglycemia 07/16/2015  . Neuropathy 06/08/2015  . Thrombocytopenia ([place]) 08/19/2014  . Abdominal pain, chronic, epigastric 06/12/2014  . Liver cirrhosis secondary to NASH ([place]) 02/27/2014  . Dizziness and giddiness 10/30/2013  . DOE (dyspnea on exertion) 10/30/2013  . Poorly controlled type 2 diabetes mellitus with complication ([place]) 63/84/6659  . DNR (do not resuscitate) 07/29/2013  . Encounter for routine gynecological examination 07/29/2013  . Dyspnea 07/10/2013  . Lung nodule 07/10/2013  . Varicose veins of left lower extremity 10/09/2012  . GERD (gastroesophageal reflux disease) 10/09/2012  . Cirrhosis of liver ([place]) 02/02/2012  . Anxiety 07/11/2011  . Aortic dissection ([place])   . BACK PAIN, LUMBAR, CHRONIC 11/19/2009  . UNS ADVRS EFF OTH RX MEDICINAL&BIOLOGICAL SBSTNC 12/16/2008  . UNSPECIFIED VITAMIN D DEFICIENCY 09/26/2008  .  TOBACCO ABUSE 06/19/2008  . RESTLESS LEGS SYNDROME 04/27/2007  . INSOMNIA 04/27/2007  . HLD (hyperlipidemia) 03/08/2007  . OSA (obstructive sleep apnea) 03/08/2007  . Essential hypertension 03/08/2007  . Asthma 03/08/2007  . BIPOLAR AFFECTIVE DISORDER, HX OF 03/08/2007    Past Surgical History:  Procedure Laterality Date  . ABDOMINAL EXPLORATION SURGERY    . ABDOMINAL HYSTERECTOMY     total  . Axillary artery cannulation via 8-mm Hemashield graft, median sternotomy, extracorporeal circulation with deep hypothermic circulatory arrest, repair of  aortic  dessection  01/23/2009   Dr Arlyce Dice  . BACK SURGERY      x 5  . cataract     both eyes  . CHOLECYSTECTOMY    . COLONOSCOPY    . EYE SURGERY Bilateral    Cataract Extraction with IOL  . HEMORROIDECTOMY     and colon polyp removed  . KNEE ARTHROSCOPY WITH LATERAL RELEASE Right 11/15/2016   Procedure: KNEE ARTHROSCOPY WITH LATERAL RELEASE;  Surgeon: Corky Mull, MD;  Location: [facility] ORS;  Service: Orthopedics;  Laterality: Right;  . LIVER BIOPSY  04/10/2012   Procedure: LIVER BIOPSY;  Surgeon: Inda Castle, MD;  Location: [facility];  Service: Endoscopy;  Laterality: N/A;  ultrasound to mark order for abd limited/liver to be marked to be sent by [email or web address]   . LUMBAR FUSION  10/09  . LUMBAR WOUND DEBRIDEMENT  05/09/2011   Procedure: LUMBAR WOUND DEBRIDEMENT;  Surgeon: Dahlia Bailiff;  Location: [facility];  Service: Orthopedics;  Laterality: N/A;  IRRIGATION AND DEBRIDEMENT SPINAL WOUND  . POSTERIOR CERVICAL FUSION/FORAMINOTOMY    . ROTATOR CUFF REPAIR     left    Prior to Admission medications   Medication Sig Start Date End Date Taking? Authorizing Provider  acetaminophen (TYLENOL) 325 MG tablet Take 650 mg by mouth every 4 (four) hours as needed (for fever, minor discomfort, or headache).     [provider]  albuterol (PROVENTIL HFA;VENTOLIN HFA) 108 (90 Base) MCG/ACT inhaler Inhale 2 puffs into the lungs every 6 (six) hours as needed for wheezing or shortness of breath. 07/28/16   Alfred Levins, [place], MD  albuterol (PROVENTIL) (2.5 MG/3ML) 0.083% nebulizer solution Take 2.5 mg by nebulization every 6 (six) hours as needed for wheezing or shortness of breath.    [provider]  ALPRAZolam Duanne Moron) 0.25 MG tablet Take by mouth.    [provider]  Alum & Mag Hydroxide-Simeth (ALMACONE PO) Take 30 mLs by mouth every 6 (six) hours as needed.    [provider]  aspirin 325 MG tablet Take by mouth.    [provider]  atorvastatin (LIPITOR) 40  MG tablet  02/05/17   [provider]  barrier cream (NON-SPECIFIED) CREA Apply 1 application topically as needed (APPLIED AFTER TOILETING TO PREVENT SKIN BREAKDOWN).    [provider]  budesonide-formoterol (SYMBICORT) 160-4.5 MCG/ACT inhaler Inhale 2 puffs into the lungs 2 (two) times daily. (0800 & 2000)    [provider]  buPROPion (WELLBUTRIN SR) 150 MG 12 hr tablet Take 150 mg by mouth daily. (0800)    [provider]  clonazePAM (KLONOPIN) 0.25 MG disintegrating tablet 0.25 mg 3 (three) times daily.  01/12/17   [provider]  clonazePAM (KLONOPIN) 0.5 MG tablet Take 0.25 mg by mouth 3 (three) times daily. (0800, 1300 & 2000)    [provider]  diclofenac sodium (VOLTAREN) 1 % GEL Apply 4 g topically every 8 (eight) hours as needed (pain). Apply to  right knee Patient not taking: Reported on 02/02/2017 02/02/17   Gillis Santa, MD  dicyclomine (BENTYL) 10 MG capsule Take 10 mg by mouth 3 (three) times daily. (0800, 1300 & 2000)    [provider]  diphenhydrAMINE (DIPHENHIST) 25 mg capsule Take 25 mg by mouth every 6 (six) hours as needed (for allergic reaction (notify provider is symptoms persist more than 24 hrs*).    [provider]  Emollient (EUCERIN INTENSIVE REPAIR) LOTN Apply 1 application topically 2 (two) times daily.    [provider]  ferrous sulfate 325 (65 FE) MG tablet Take 325 mg by mouth 2 (two) times daily. (0800 & 2000)    [provider]  furosemide (LASIX) 80 MG tablet Take 80 mg by mouth daily. (0800)    [provider]  gabapentin (NEURONTIN) 300 MG capsule  02/04/17   [provider]  gabapentin (NEURONTIN) 600 MG tablet Take 600 mg by mouth 2 (two) times daily. (0800 & 2000)    [provider]  guaifenesin (ROBITUSSIN) 100 MG/5ML syrup Take 300 mg by mouth every 6 (six) hours as needed for cough.     [provider]  insulin glargine (LANTUS) 100  UNIT/ML injection Inject 28 Units into the skin at bedtime.    [provider]  insulin lispro (HUMALOG KWIKPEN) 100 UNIT/ML KiwkPen Inject 0-9 Units into the skin 3 (three) times daily. Check FSBS before each meal & cover with SSI (sliding scale insulin)    [provider]  lactulose (CHRONULAC) 10 GM/15ML solution Take 10 g by mouth 3 (three) times a week. 10/17/16   [provider]  lamoTRIgine (LAMICTAL) 100 MG tablet Take 100 mg by mouth 2 (two) times daily. (0800 & 2000)    [provider]  lidocaine (LIDODERM) 5 % Place 3 patches onto the skin daily.  02/01/17   [provider]  loperamide (IMODIUM) 2 MG capsule Take 4 mg by mouth as needed for diarrhea or loose stools. *Do not exceed 8 doses in 24 hours*    [provider]  LORazepam (ATIVAN) 0.5 MG tablet Take 0.5 mg by mouth every 8 (eight) hours.    [provider]  LYRICA 150 MG capsule Take 150 mg by mouth 3 (three) times daily. 11/29/16   [provider]  magnesium hydroxide (MILK OF MAGNESIA) 400 MG/5ML suspension Take 30 mLs by mouth daily as needed (for constipation).    [provider]  Menthol-Zinc Oxide (GOLD BOND EX) Apply 1 application topically daily.    [provider]  metoCLOPramide (REGLAN) 5 MG tablet Take 5 mg by mouth 4 (four) times daily -  before meals and at bedtime. (0800, 1200, 1600 & 2000)    [provider]  mirtazapine (REMERON) 15 MG tablet Take 45 mg by mouth at bedtime. (2000)     [provider]  mirtazapine (REMERON) 45 MG tablet  02/05/17   [provider]  omeprazole (PRILOSEC) 20 MG capsule Take 20 mg by mouth daily before breakfast. (0630)    [provider]  ondansetron (ZOFRAN) 4 MG tablet Take 4 mg by mouth every 4 (four) hours as needed for nausea or vomiting.    [provider]  oxybutynin (DITROPAN-XL) 5 MG 24 hr tablet Take 5 mg by mouth daily. (0800)    [provider]  oxyCODONE (ROXICODONE) 5 MG immediate release tablet Take 1 tablet (5 mg total) by mouth every 4 (four) hours as needed for  severe pain. 11/15/16   Poggi, Marshall Cork, MD  pantoprazole (PROTONIX) 40 MG tablet  02/05/17   [provider]  potassium chloride SA (K-DUR,KLOR-CON) 20 MEQ tablet Take 20 mEq by mouth daily. (0800)    [provider]  pyrithione zinc (HEAD AND SHOULDERS) 1 % shampoo Apply 1 application topically daily. t-gel or head and shoulders.    [provider]  rOPINIRole (REQUIP) 3 MG tablet Take 3 mg by mouth at bedtime. (2000)    [provider]  simvastatin (ZOCOR) 10 MG tablet Take 10 mg by mouth at bedtime. (2000)    [provider]  spironolactone (ALDACTONE) 25 MG tablet Take 25 mg by mouth daily. (0800)    [provider]  tiZANidine (ZANAFLEX) 4 MG tablet Take 1 tablet (4 mg total) by mouth 2 (two) times daily as needed for muscle spasms. Patient not taking: Reported on 02/02/2017 02/02/17   Gillis Santa, MD  traMADol (ULTRAM) 50 MG tablet Take 50 mg by mouth 2 (two) times daily. (0800 & 2000)    [provider]  vitamin C (ASCORBIC ACID) 500 MG tablet Take 500 mg by mouth 2 (two) times daily. (0800 & 2000)    [provider]  Water For Irrigation, Sterile ([place]) Irrigate with as directed at bedtime. Use with CPAP machine at bedtime during sleep & remove in the morning    [provider]  Xylitol (XYLIMELTS) 500 MG DISK Use as directed 1 capsule in the mouth or throat 3 (three) times daily.    [provider]  zolpidem (AMBIEN) 5 MG tablet Take 5 mg by mouth at bedtime. (2000)    [provider]    Allergies Doxycycline  Family History  Problem Relation Age of Onset  . Breast cancer Mother   . Lung cancer Mother   . Stomach cancer Father   . Diabetes Unknown        4 aunts, and 1 uncle  . Colon cancer Neg Hx   . Esophageal cancer Neg Hx    . Rectal cancer Neg Hx     Social History Social History  Substance Use Topics  . Smoking status: Former Smoker    Packs/day: 1.50    Years: 50.00    Types: Cigarettes    Quit date: 04/06/2013  . Smokeless tobacco: Never Used  . Alcohol use No    Review of Systems Level V caveat: Unable to obtain ROS due to altered mental status.    ____________________________________________   PHYSICAL EXAM:  VITAL SIGNS: ED Triage Vitals  Enc Vitals Group     BP 03/07/17 2018 (!) 135/97     Pulse Rate 03/07/17 2018 83     Resp 03/07/17 2018 20     Temp 03/07/17 2018 98.3 F (36.8 C)     Temp Source 03/07/17 2018 Oral     SpO2 03/07/17 2016 93 %     Weight 03/07/17 2019 224 lb 13.9 oz (102 kg)     Height 03/07/17 2019 5' 3"  (1.6 m)     Head Circumference --      Peak Flow --      Pain Score 03/07/17 2016 0     Pain Loc --      Pain Edu? --      Excl. in [place]? --     Constitutional: Alert, eyes open.  Responds minimally to some commands.  Does not respond to painful stimuli.  Eyes: Conjunctivae are  normal.  EOMI.  PERRLA.  Head: Atraumatic. Nose: No congestion/rhinnorhea. Mouth/Throat: Mucous membranes are slightly dry.   Neck: Normal range of motion.  Cardiovascular: Normal rate, regular rhythm. Grossly normal heart sounds.  Good peripheral circulation. Respiratory: Normal respiratory effort.  No retractions. Lungs CTAB. Gastrointestinal: Soft and nontender. No distention.  Genitourinary: Normal external genitalia.  Musculoskeletal: No lower extremity edema.  Extremities warm and well perfused.  Neurologic:  Patient is unresponsive to painful stimuli.  Extremities are flaccid.  Eyes open.  When given commands such as "squeeze my hand" or "close your eyes" patient demonstrates very minimal but purposeful movement following the command.  Skin:  Skin is warm and dry. No rash noted. Psychiatric: Unable to assess.   ____________________________________________   LABS (all  labs ordered are listed, but only abnormal results are displayed)  Labs Reviewed  COMPREHENSIVE METABOLIC PANEL - Abnormal; Notable for the following:       Result Value   Glucose, Bld 100 (*)    Alkaline Phosphatase 259 (*)    All other components within normal limits  CBC - Abnormal; Notable for the following:    RDW 15.7 (*)    Platelets 110 (*)    All other components within normal limits  URINALYSIS, ROUTINE W REFLEX MICROSCOPIC - Abnormal; Notable for the following:    Color, Urine YELLOW (*)    APPearance CLEAR (*)    All other components within normal limits  GLUCOSE, CAPILLARY - Abnormal; Notable for the following:    Glucose-Capillary 109 (*)    All other components within normal limits  AMMONIA - Abnormal; Notable for the following:    Ammonia 39 (*)    All other components within normal limits  GLUCOSE, CAPILLARY  PROTIME-INR  APTT  TROPONIN I  DIFFERENTIAL  CBG MONITORING, ED   ____________________________________________  EKG  ED ECG REPORT I, Arta Silence, the attending physician, personally viewed and interpreted this ECG.  Date: 03/07/2017 EKG Time: 2014 Rate: 82 Rhythm: normal sinus rhythm QRS Axis: normal Intervals: normal ST/T Wave abnormalities: normal Narrative Interpretation: no evidence of acute ischemia  ____________________________________________  RADIOLOGY  CT head: No evidence of acute stroke or other acute findings.   CXR: Cardiomegaly, no acute findings.   ____________________________________________   PROCEDURES  Procedure(s) performed: No    Critical Care performed: Yes  CRITICAL CARE Performed by: Arta Silence   Total critical care time: 40 minutes  Critical care time was exclusive of separately billable procedures and treating other patients.  Critical care was necessary to treat or prevent imminent or life-threatening deterioration.  Critical care was time spent personally by me on the following  activities: development of treatment plan with patient and/or surrogate as well as nursing, discussions with consultants, evaluation of patient's response to treatment, examination of patient, obtaining history from patient or surrogate, ordering and performing treatments and interventions, ordering and review of laboratory studies, ordering and review of radiographic studies, pulse oximetry and re-evaluation of patient's condition. ____________________________________________   INITIAL IMPRESSION / ASSESSMENT AND PLAN / ED COURSE  Pertinent labs & imaging results that were available during my care of the patient were reviewed by me and considered in my medical decision making (see chart for details).  66 year old female with past medical history as noted presents with acute onset of altered mental status approximately 3 hours prior to arrival. Patient apparently normally is alert, oriented, and able to have a conversation. On ED arrival, patient's vital signs are normal.  She appears unresponsive,  and does not move or respond purposefully to deep painful stimuli, however when given commands such as "squeeze my hand," she does display very small purposeful movements.  Overall differential includes acute CVA with paralysis and aphasia although this is less likely given the global nature of the symptoms, acute hemorrhage, hypoglycemia or other metabolic cause, toxicologic cause, less likely infection. Given that patient does purposefully follow commands albeit with minimal movements, her presentation is not consistent with non-convulsive status epilepticus.    Plan: pt made stroke code, STAT CT head, tele-neuro consult, labs, and reassess.  Plan for admission.   ----------------------------------------- 10:13 PM on 03/07/2017 -----------------------------------------  CT head is negative. I discussed the case with associated states that he is more consistent with AMS from metabolic cause than acute  stroke, but recommended CT angiogram head and neck. We also added on pneumonia, however this is only minimally elevated.  Awaiting CT angio and remainder of labs, and then will proceed with admission.  ----------------------------------------- 11:34 PM on 03/07/2017 -----------------------------------------  CT angios negative. Patient still pending a few labs that workup is generally negative. Her mental status has not changed. Another possible differential is primary psych with catatonic type reaction.  However pt has no lead-pipe rigidity, and on further review of her past charts there is no prior hx of any episode like this.  We will proceed with admission for AMS, with plan for MRI in the AM as recommended by [facility].  Patient signed out to hospitalist.      ____________________________________________   FINAL CLINICAL IMPRESSION(S) / ED DIAGNOSES  Final diagnoses:  Altered mental status, unspecified altered mental status type      NEW MEDICATIONS STARTED DURING THIS VISIT:  New Prescriptions   No medications on file     Note:  This document was prepared using Dragon voice recognition software and may include unintentional dictation errors.    Arta Silence, MD 03/07/17 [phone]

## 2017-03-07 NOTE — ED Notes (Signed)
Patient transported to CT 

## 2017-03-07 NOTE — ED Notes (Addendum)
This RN contacted the [employer] [employer], per the pt's nurse at the facility, pt was last known well around 1700, speaking appropriately and eating dinner.  Same nurse was taking pt her routine night meds when she found pt unresponsive.

## 2017-03-07 NOTE — ED Notes (Signed)
Speaking with [place] on behalf of pt at this time.

## 2017-03-07 NOTE — ED Notes (Signed)
Portable XR at bedside

## 2017-03-07 NOTE — ED Triage Notes (Signed)
Pt arrives from [facility] [place] via [place]. Staff reported pt was unresponsive. EMS delivered ammonia inhalant and pt woke up and was able to answer several questions. CBG 98 for EMS and 87 for [name] upon arrival. Pt opens eyes slightly to voice and denies any pain at this time. Pt able to give yes and no answers but currently not talking. Pt tearful and nods yes when asked if it is hard to talk. Pt O2 sats were 93% on room air. Pt placed on 2L [place] and satting 100% at this time. Pt skin warm and dry.

## 2017-03-08 ENCOUNTER — Observation Stay: Payer: Medicare Other

## 2017-03-08 ENCOUNTER — Encounter: Payer: Self-pay | Admitting: *Deleted

## 2017-03-08 DIAGNOSIS — R4182 Altered mental status, unspecified: Secondary | ICD-10-CM | POA: Diagnosis not present

## 2017-03-08 LAB — GLUCOSE, CAPILLARY
GLUCOSE-CAPILLARY: 130 mg/dL — AB (ref 65–99)
GLUCOSE-CAPILLARY: 116 mg/dL — AB (ref 65–99)
GLUCOSE-CAPILLARY: 333 mg/dL — AB (ref 65–99)
Glucose-Capillary: 143 mg/dL — ABNORMAL HIGH (ref 65–99)
Glucose-Capillary: 155 mg/dL — ABNORMAL HIGH (ref 65–99)

## 2017-03-08 LAB — URINE DRUG SCREEN, QUALITATIVE (ARMC ONLY)
AMPHETAMINES, UR SCREEN: NOT DETECTED
Barbiturates, Ur Screen: NOT DETECTED
Benzodiazepine, Ur Scrn: NOT DETECTED
CANNABINOID 50 NG, UR ~~LOC~~: NOT DETECTED
COCAINE METABOLITE, UR ~~LOC~~: NOT DETECTED
MDMA (ECSTASY) UR SCREEN: NOT DETECTED
Methadone Scn, Ur: NOT DETECTED
OPIATE, UR SCREEN: NOT DETECTED
PHENCYCLIDINE (PCP) UR S: NOT DETECTED
Tricyclic, Ur Screen: NOT DETECTED

## 2017-03-08 LAB — BASIC METABOLIC PANEL
Anion gap: 9 (ref 5–15)
BUN: 15 mg/dL (ref 6–20)
CHLORIDE: 104 mmol/L (ref 101–111)
CO2: 28 mmol/L (ref 22–32)
CREATININE: 0.76 mg/dL (ref 0.44–1.00)
Calcium: 8.7 mg/dL — ABNORMAL LOW (ref 8.9–10.3)
GFR calc Af Amer: >60 mL/min (ref >60)
GFR calc non Af Amer: >60 mL/min (ref >60)
GLUCOSE: 140 mg/dL — AB (ref 65–99)
POTASSIUM: 3.9 mmol/L (ref 3.5–5.1)
SODIUM: 141 mmol/L (ref 135–145)

## 2017-03-08 LAB — CBC
HEMATOCRIT: 36.7 % (ref 35.0–47.0)
Hemoglobin: 12.5 g/dL (ref 12.0–16.0)
MCH: 31.8 pg (ref 26.0–34.0)
MCHC: 34.2 g/dL (ref 32.0–36.0)
MCV: 92.8 fL (ref 80.0–100.0)
PLATELETS: 104 10*3/uL — AB (ref 150–440)
RBC: 3.95 MIL/uL (ref 3.80–5.20)
RDW: 15.9 % — AB (ref 11.5–14.5)
WBC: 7.1 10*3/uL (ref 3.6–11.0)

## 2017-03-08 LAB — MRSA PCR SCREENING: MRSA BY PCR: NEGATIVE

## 2017-03-08 MED ORDER — SODIUM CHLORIDE 0.9% FLUSH
3.0000 mL | INTRAVENOUS | Status: DC | PRN
  Administered 2017-03-11: 3 mL via INTRAVENOUS
  Filled 2017-03-08: qty 3

## 2017-03-08 MED ORDER — SENNOSIDES-DOCUSATE SODIUM 8.6-50 MG PO TABS: 1.0000 | ORAL_TABLET | Freq: Every evening | ORAL | Status: DC | PRN

## 2017-03-08 MED ORDER — ONDANSETRON HCL 4 MG PO TABS: 4.0000 mg | ORAL_TABLET | Freq: Four times a day (QID) | ORAL | Status: DC | PRN

## 2017-03-08 MED ORDER — ONDANSETRON HCL 4 MG/2ML IJ SOLN
4.0000 mg | Freq: Four times a day (QID) | INTRAMUSCULAR | Status: DC | PRN
  Administered 2017-03-09: 4 mg via INTRAVENOUS
  Filled 2017-03-08: qty 2

## 2017-03-08 MED ORDER — SODIUM CHLORIDE 0.9 % IV SOLN: 250.0000 mL | INTRAVENOUS | Status: DC | PRN

## 2017-03-08 MED ORDER — ALBUTEROL SULFATE (2.5 MG/3ML) 0.083% IN NEBU: 2.5000 mg | INHALATION_SOLUTION | RESPIRATORY_TRACT | Status: DC | PRN

## 2017-03-08 MED ORDER — INSULIN ASPART 100 UNIT/ML ~~LOC~~ SOLN
0.0000 [IU] | Freq: Three times a day (TID) | SUBCUTANEOUS | Status: DC
Start: 2017-03-08 — End: 2017-03-11
  Administered 2017-03-08: 1 [IU] via SUBCUTANEOUS
  Administered 2017-03-08: 7 [IU] via SUBCUTANEOUS
  Administered 2017-03-09: 1 [IU] via SUBCUTANEOUS
  Administered 2017-03-09 – 2017-03-10 (×2): 2 [IU] via SUBCUTANEOUS
  Administered 2017-03-10: 1 [IU] via SUBCUTANEOUS
  Administered 2017-03-11: 3 [IU] via SUBCUTANEOUS
  Administered 2017-03-11: 1 [IU] via SUBCUTANEOUS
  Filled 2017-03-08 (×8): qty 1

## 2017-03-08 MED ORDER — LAMOTRIGINE 25 MG PO TABS
100.0000 mg | ORAL_TABLET | Freq: Two times a day (BID) | ORAL | Status: DC
  Administered 2017-03-08 – 2017-03-11 (×7): 100 mg via ORAL
  Filled 2017-03-08 (×2): qty 4
  Filled 2017-03-08 (×5): qty 1

## 2017-03-08 MED ORDER — ASPIRIN 325 MG PO TABS
325.0000 mg | ORAL_TABLET | Freq: Every day | ORAL | Status: DC
  Administered 2017-03-08 – 2017-03-10 (×3): 325 mg via ORAL
  Filled 2017-03-08 (×5): qty 1

## 2017-03-08 MED ORDER — OXYBUTYNIN CHLORIDE ER 5 MG PO TB24
5.0000 mg | ORAL_TABLET | Freq: Every day | ORAL | Status: DC
  Administered 2017-03-08 – 2017-03-11 (×4): 5 mg via ORAL
  Filled 2017-03-08 (×4): qty 1

## 2017-03-08 MED ORDER — INSULIN ASPART 100 UNIT/ML ~~LOC~~ SOLN: 0.0000 [IU] | Freq: Every day | SUBCUTANEOUS | Status: DC

## 2017-03-08 MED ORDER — FUROSEMIDE 40 MG PO TABS
80.0000 mg | ORAL_TABLET | Freq: Every day | ORAL | Status: DC
  Administered 2017-03-08 – 2017-03-11 (×4): 80 mg via ORAL
  Filled 2017-03-08 (×4): qty 2

## 2017-03-08 MED ORDER — HYDROCODONE-ACETAMINOPHEN 5-325 MG PO TABS
1.0000 | ORAL_TABLET | ORAL | Status: DC | PRN
  Administered 2017-03-08 – 2017-03-09 (×2): 2 via ORAL
  Administered 2017-03-09: 1 via ORAL
  Administered 2017-03-09 (×2): 2 via ORAL
  Administered 2017-03-10 (×2): 1 via ORAL
  Filled 2017-03-08: qty 1
  Filled 2017-03-08: qty 2
  Filled 2017-03-08 (×2): qty 1
  Filled 2017-03-08 (×3): qty 2

## 2017-03-08 MED ORDER — SPIRONOLACTONE 25 MG PO TABS
25.0000 mg | ORAL_TABLET | Freq: Every day | ORAL | Status: DC
  Administered 2017-03-08 – 2017-03-11 (×4): 25 mg via ORAL
  Filled 2017-03-08 (×4): qty 1

## 2017-03-08 MED ORDER — ATORVASTATIN CALCIUM 20 MG PO TABS
40.0000 mg | ORAL_TABLET | Freq: Every day | ORAL | Status: DC
  Administered 2017-03-08 – 2017-03-10 (×3): 40 mg via ORAL
  Filled 2017-03-08 (×3): qty 2

## 2017-03-08 MED ORDER — SODIUM CHLORIDE 0.9% FLUSH
3.0000 mL | Freq: Two times a day (BID) | INTRAVENOUS | Status: DC
  Administered 2017-03-08 – 2017-03-10 (×6): 3 mL via INTRAVENOUS

## 2017-03-08 MED ORDER — MAGNESIUM HYDROXIDE 400 MG/5ML PO SUSP
30.0000 mL | Freq: Every day | ORAL | Status: DC | PRN
  Filled 2017-03-08: qty 30

## 2017-03-08 MED ORDER — FERROUS SULFATE 325 (65 FE) MG PO TABS
325.0000 mg | ORAL_TABLET | Freq: Two times a day (BID) | ORAL | Status: DC
  Administered 2017-03-08 – 2017-03-11 (×7): 325 mg via ORAL
  Filled 2017-03-08 (×7): qty 1

## 2017-03-08 MED ORDER — METOCLOPRAMIDE HCL 10 MG PO TABS
5.0000 mg | ORAL_TABLET | Freq: Three times a day (TID) | ORAL | Status: DC
  Administered 2017-03-08 – 2017-03-11 (×14): 5 mg via ORAL
  Filled 2017-03-08 (×16): qty 1

## 2017-03-08 MED ORDER — LOPERAMIDE HCL 2 MG PO CAPS: 4.0000 mg | ORAL_CAPSULE | ORAL | Status: DC | PRN

## 2017-03-08 MED ORDER — ALPRAZOLAM 0.25 MG PO TABS
0.2500 mg | ORAL_TABLET | Freq: Three times a day (TID) | ORAL | Status: DC | PRN
  Administered 2017-03-09 (×2): 0.25 mg via ORAL
  Filled 2017-03-08 (×2): qty 1

## 2017-03-08 MED ORDER — INSULIN GLARGINE 100 UNIT/ML ~~LOC~~ SOLN
28.0000 [IU] | Freq: Every day | SUBCUTANEOUS | Status: DC
  Administered 2017-03-08 – 2017-03-10 (×3): 28 [IU] via SUBCUTANEOUS
  Filled 2017-03-08 (×5): qty 0.28

## 2017-03-08 MED ORDER — PANTOPRAZOLE SODIUM 40 MG PO TBEC
40.0000 mg | DELAYED_RELEASE_TABLET | Freq: Every day | ORAL | Status: DC
  Administered 2017-03-08 – 2017-03-11 (×4): 40 mg via ORAL
  Filled 2017-03-08 (×4): qty 1

## 2017-03-08 MED ORDER — MOMETASONE FURO-FORMOTEROL FUM 200-5 MCG/ACT IN AERO
2.0000 | INHALATION_SPRAY | Freq: Two times a day (BID) | RESPIRATORY_TRACT | Status: DC
  Administered 2017-03-08 – 2017-03-11 (×7): 2 via RESPIRATORY_TRACT
  Filled 2017-03-08 (×2): qty 8.8

## 2017-03-08 MED ORDER — ACETAMINOPHEN 325 MG PO TABS
650.0000 mg | ORAL_TABLET | Freq: Four times a day (QID) | ORAL | Status: DC | PRN
  Administered 2017-03-08 – 2017-03-09 (×3): 650 mg via ORAL
  Filled 2017-03-08 (×3): qty 2

## 2017-03-08 MED ORDER — LACTULOSE 10 GM/15ML PO SOLN
10.0000 g | ORAL | Status: DC
  Administered 2017-03-10: 10 g via ORAL
  Filled 2017-03-08: qty 30

## 2017-03-08 MED ORDER — DIPHENHYDRAMINE HCL 25 MG PO CAPS: 25.0000 mg | ORAL_CAPSULE | Freq: Four times a day (QID) | ORAL | Status: DC | PRN

## 2017-03-08 MED ORDER — BISACODYL 5 MG PO TBEC: 5.0000 mg | DELAYED_RELEASE_TABLET | Freq: Every day | ORAL | Status: DC | PRN

## 2017-03-08 MED ORDER — ACETAMINOPHEN 650 MG RE SUPP: 650.0000 mg | Freq: Four times a day (QID) | RECTAL | Status: DC | PRN

## 2017-03-08 MED ORDER — DICYCLOMINE HCL 10 MG PO CAPS
10.0000 mg | ORAL_CAPSULE | Freq: Three times a day (TID) | ORAL | Status: DC
  Administered 2017-03-08 – 2017-03-10 (×7): 10 mg via ORAL
  Filled 2017-03-08 (×8): qty 1

## 2017-03-08 MED ORDER — BUPROPION HCL ER (SR) 150 MG PO TB12
150.0000 mg | ORAL_TABLET | Freq: Every day | ORAL | Status: DC
  Administered 2017-03-08 – 2017-03-11 (×4): 150 mg via ORAL
  Filled 2017-03-08 (×4): qty 1

## 2017-03-08 MED ORDER — ENOXAPARIN SODIUM 40 MG/0.4ML ~~LOC~~ SOLN
40.0000 mg | SUBCUTANEOUS | Status: DC
  Administered 2017-03-08 – 2017-03-10 (×3): 40 mg via SUBCUTANEOUS
  Filled 2017-03-08 (×3): qty 0.4

## 2017-03-08 MED ORDER — GUAIFENESIN 100 MG/5ML PO SYRP
300.0000 mg | ORAL_SOLUTION | Freq: Four times a day (QID) | ORAL | Status: DC | PRN
Start: 2017-03-08 — End: 2017-03-11
  Filled 2017-03-08: qty 15

## 2017-03-08 MED ORDER — POTASSIUM CHLORIDE CRYS ER 20 MEQ PO TBCR
20.0000 meq | EXTENDED_RELEASE_TABLET | Freq: Every day | ORAL | Status: DC
  Administered 2017-03-08 – 2017-03-11 (×4): 20 meq via ORAL
  Filled 2017-03-08 (×4): qty 1

## 2017-03-08 MED ORDER — OXYCODONE HCL 5 MG PO TABS
5.0000 mg | ORAL_TABLET | ORAL | Status: DC | PRN
  Administered 2017-03-08 – 2017-03-09 (×3): 5 mg via ORAL
  Filled 2017-03-08 (×3): qty 1

## 2017-03-08 NOTE — Progress Notes (Signed)
Patient is admitted for altered mental status due to polypharmacy. Head CAT scan is normal. Patient is alert and awake. Denies any complaints. Does not know what happened and why she is brought to the hospital. Vitals reviewed, stable Labs: Reviewed, stable. Physical examination:Alert, awake, oriented. Cardiovascular: S1, S2 regular. Lungs: Clear to auscultation, no wheeze, no rales. Neurological: Alert, awake, oriented.No focal neurological deficit is observed.   Abdomen soft, nontender, nondistended, obese, bowel  # 1, Altered level of consciousness likely due to polypharmacy from Neurontin, Lyrica, Remeron, Ambien, tramadol, Klonopin, patient says she takes for a long time.out patient is alert, awake, oriented, she is the doses of these medicines and discharge back to skilled nursing tomorrow.ammonia level is normal.MRI of the brain is ordered. #2 nonalcoholic cirrhosis of the liver: Follows up Dr. Ollen Bowl. #3 left knee ppain: Patient supposed to have MRI of the left knee is an outpatient tomorrow morning at 9 AM but she would like to have it while she is here. #4 diabetes mellitus type 2: Controlled #5 ,depression, anxiety: Continue Lamictal,.but continue to hold Neurontin, Klonopin, Lyrica, mirtazapine, Klonopin, Ambien, tramadol Time spent 25 minutes.

## 2017-03-08 NOTE — Progress Notes (Signed)
Pharmacy note Medications reviewed for polypharmacy. According to medication reconciliation, patient was prescribed 3 benzodiazepine medications which represents a possible therapeutic duplication. Patient was also prescribed gabapentin and Lyrica which may also represent a duplication. From the H&P the diagnosis related to the gabapentin and Lyrica was not immediately clear. If the patient requires benzodiazepine medication or gabapentin/Lyrica it may be prudent to start back with one medication from each class at a low dose and titrate to patient response. Patient's current inpatient medication list does not appear to contain duplicative therapy.  Sim Boast, PharmD, BCPS  03/08/17 6:01 AM

## 2017-03-08 NOTE — H&P (Signed)
[facility] [place] NAME: Stephanie [name]    MR#:  [id number]  DATE OF BIRTH:  28-May-1951  DATE OF ADMISSION:  03/07/2017  PRIMARY CARE PHYSICIAN: Sarajane Jews, MD   REQUESTING/REFERRING PHYSICIAN: Arta Silence, MD  CHIEF COMPLAINT:   Chief Complaint  Patient presents with  . Altered Mental Status   Altered mental status. HISTORY OF PRESENT ILLNESS:  Stephanie [name]  is a 66 y.o. female with a known history of Multiple medical problems. The patient was sent to the ED due to altered mental status. The patient is unresponsive to verbal stimuli. According to the ED physician, the patient was seen normal at 17:00 today. Then she was noted to be unresponsive. Per EMS, the patient awoke and was responsive to smelling salt. CT angiogram of the head didn't show any CVA. Chest x-ray and urinalysis are unremarkable.  PAST MEDICAL HISTORY:   Past Medical History:  Diagnosis Date  . Adenomatous colon polyp 08/27/2014   Polyps x 3  . Anemia   . Anxiety   . 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 Arthritis   . Asthma   . Bipolar affective disorder ([place])    h/o  . Blood transfusion 2000  . Blood transfusion without reported diagnosis   . Cancer ([place])   . Chronic abdominal pain    abdominal wall pain  . 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 Colon polyp   . Depression   . Diabetes mellitus without complication ([place])   . Fatty liver 04/09/08   found in abd CT  . GERD (gastroesophageal reflux disease)   . H/O: CVA (cardiovascular accident)    TIA  . H/O: rheumatic fever   . Hepatitis    HEP "B" twenty years ago  . Hyperlipidemia   . Incontinence   . Insomnia   . Lower extremity edema   . Obstructive sleep apnea    Use C-PAP twice per week  . Platelets decreased ([place])   . RLS (restless legs syndrome)   . Shortness of breath   . Sleep apnea   . Stroke [facility])    TIA- 2002  . Ulcer   . Unspecified disorders of nervous system      PAST SURGICAL HISTORY:   Past Surgical History:  Procedure Laterality Date  . ABDOMINAL EXPLORATION SURGERY    . ABDOMINAL HYSTERECTOMY     total  . Axillary artery cannulation via 8-mm Hemashield graft, median sternotomy, extracorporeal circulation with deep hypothermic circulatory arrest, repair of  aortic dessection  01/23/2009   Dr Arlyce Dice  . BACK SURGERY      x 5  . cataract     both eyes  . CHOLECYSTECTOMY    . COLONOSCOPY    . EYE SURGERY Bilateral    Cataract Extraction with IOL  . HEMORROIDECTOMY     and colon polyp removed  . KNEE ARTHROSCOPY WITH LATERAL RELEASE Right 11/15/2016   Procedure: KNEE ARTHROSCOPY WITH LATERAL RELEASE;  Surgeon: Corky Mull, MD;  Location: [facility] ORS;  Service: Orthopedics;  Laterality: Right;  . LIVER BIOPSY  04/10/2012   Procedure: LIVER BIOPSY;  Surgeon: Inda Castle, MD;  Location: [facility];  Service: Endoscopy;  Laterality: N/A;  ultrasound to mark order for abd limited/liver to be marked to be sent by [email or web address]   . LUMBAR FUSION  10/09  . LUMBAR WOUND DEBRIDEMENT  05/09/2011   Procedure: LUMBAR WOUND DEBRIDEMENT;  Surgeon: Dahlia Bailiff;  Location: [facility];  Service: Orthopedics;  Laterality: N/A;  IRRIGATION AND DEBRIDEMENT SPINAL WOUND  . POSTERIOR CERVICAL FUSION/FORAMINOTOMY    . ROTATOR CUFF REPAIR     left    SOCIAL HISTORY:   Social History  Substance Use Topics  . Smoking status: Former Smoker    Packs/day: 1.50    Years: 50.00    Types: Cigarettes    Quit date: 04/06/2013  . Smokeless tobacco: Never Used  . Alcohol use No    FAMILY HISTORY:   Family History  Problem Relation Age of Onset  . Breast cancer Mother   . Lung cancer Mother   . Stomach cancer Father   . Diabetes Unknown        4 aunts, and 1 uncle  . Colon cancer Neg Hx   . Esophageal cancer Neg Hx   . Rectal cancer Neg Hx     DRUG ALLERGIES:   Allergies  Allergen Reactions  . Doxycycline Hives, Swelling and Other (See Comments)     Reaction:  Facial swelling     REVIEW OF SYSTEMS:   Review of Systems  Unable to perform ROS: Mental status change    MEDICATIONS AT HOME:   Prior to Admission medications   Medication Sig Start Date End Date Taking? Authorizing Provider  acetaminophen (TYLENOL) 325 MG tablet Take 650 mg by mouth every 4 (four) hours as needed (for fever, minor discomfort, or headache).    Yes [provider]  albuterol (PROVENTIL HFA;VENTOLIN HFA) 108 (90 Base) MCG/ACT inhaler Inhale 2 puffs into the lungs every 6 (six) hours as needed for wheezing or shortness of breath. 07/28/16  Yes Alfred Levins, [place], MD  albuterol (PROVENTIL) (2.5 MG/3ML) 0.083% nebulizer solution Take 2.5 mg by nebulization every 6 (six) hours as needed for wheezing or shortness of breath.   Yes [provider]  ALPRAZolam Duanne Moron) 0.25 MG tablet Take by mouth.   Yes [provider]  Alum & Mag Hydroxide-Simeth (ALMACONE PO) Take 30 mLs by mouth every 6 (six) hours as needed.   Yes [provider]  aspirin 325 MG tablet Take by mouth.   Yes [provider]  atorvastatin (LIPITOR) 40 MG tablet  02/05/17  Yes [provider]  barrier cream (NON-SPECIFIED) CREA Apply 1 application topically as needed (APPLIED AFTER TOILETING TO PREVENT SKIN BREAKDOWN).   Yes [provider]  budesonide-formoterol (SYMBICORT) 160-4.5 MCG/ACT inhaler Inhale 2 puffs into the lungs 2 (two) times daily. (0800 & 2000)   Yes [provider]  buPROPion (WELLBUTRIN SR) 150 MG 12 hr tablet Take 150 mg by mouth daily. (0800)   Yes [provider]  clonazePAM (KLONOPIN) 0.25 MG disintegrating tablet 0.25 mg 3 (three) times daily.  01/12/17  Yes [provider]  clonazePAM (KLONOPIN) 0.5 MG tablet Take 0.25 mg by mouth 3 (three) times daily. (0800, 1300 & 2000)   Yes [provider]  dicyclomine (BENTYL) 10 MG capsule Take 10 mg by mouth 3 (three) times daily. (0800, 1300 &  2000)   Yes [provider]  diphenhydrAMINE (DIPHENHIST) 25 mg capsule Take 25 mg by mouth every 6 (six) hours as needed (for allergic reaction (notify provider is symptoms persist more than 24 hrs*).   Yes [provider]  Emollient (EUCERIN INTENSIVE REPAIR) LOTN Apply 1 application topically 2 (two) times daily.   Yes [provider]  ferrous sulfate 325 (65 FE) MG tablet Take 325 mg by mouth  2 (two) times daily. (0800 & 2000)   Yes [provider]  furosemide (LASIX) 80 MG tablet Take 80 mg by mouth daily. (0800)   Yes [provider]  gabapentin (NEURONTIN) 300 MG capsule  02/04/17  Yes [provider]  gabapentin (NEURONTIN) 600 MG tablet Take 600 mg by mouth 2 (two) times daily. (0800 & 2000)   Yes [provider]  guaifenesin (ROBITUSSIN) 100 MG/5ML syrup Take 300 mg by mouth every 6 (six) hours as needed for cough.    Yes [provider]  insulin glargine (LANTUS) 100 UNIT/ML injection Inject 28 Units into the skin at bedtime.   Yes [provider]  insulin lispro (HUMALOG KWIKPEN) 100 UNIT/ML KiwkPen Inject 0-9 Units into the skin 3 (three) times daily. Check FSBS before each meal & cover with SSI (sliding scale insulin)   Yes [provider]  lactulose (CHRONULAC) 10 GM/15ML solution Take 10 g by mouth 3 (three) times a week. 10/17/16  Yes [provider]  lamoTRIgine (LAMICTAL) 100 MG tablet Take 100 mg by mouth 2 (two) times daily. (0800 & 2000)   Yes [provider]  lidocaine (LIDODERM) 5 % Place 3 patches onto the skin daily.  02/01/17  Yes [provider]  loperamide (IMODIUM) 2 MG capsule Take 4 mg by mouth as needed for diarrhea or loose stools. *Do not exceed 8 doses in 24 hours*   Yes [provider]  LORazepam (ATIVAN) 0.5 MG tablet Take 0.5 mg by mouth every 8 (eight) hours.   Yes [provider]  LYRICA 150 MG capsule Take 150 mg by mouth 3 (three)  times daily. 11/29/16  Yes [provider]  magnesium hydroxide (MILK OF MAGNESIA) 400 MG/5ML suspension Take 30 mLs by mouth daily as needed (for constipation).   Yes [provider]  Menthol-Zinc Oxide (GOLD BOND EX) Apply 1 application topically daily.   Yes [provider]  metoCLOPramide (REGLAN) 5 MG tablet Take 5 mg by mouth 4 (four) times daily -  before meals and at bedtime. (0800, 1200, 1600 & 2000)   Yes [provider]  mirtazapine (REMERON) 15 MG tablet Take 45 mg by mouth at bedtime. (2000)    Yes [provider]  mirtazapine (REMERON) 45 MG tablet  02/05/17  Yes [provider]  omeprazole (PRILOSEC) 20 MG capsule Take 20 mg by mouth daily before breakfast. (0630)   Yes [provider]  ondansetron (ZOFRAN) 4 MG tablet Take 4 mg by mouth every 4 (four) hours as needed for nausea or vomiting.   Yes [provider]  oxybutynin (DITROPAN-XL) 5 MG 24 hr tablet Take 5 mg by mouth daily. (0800)   Yes [provider]  oxyCODONE (ROXICODONE) 5 MG immediate release tablet Take 1 tablet (5 mg total) by mouth every 4 (four) hours as needed for severe pain. 11/15/16  Yes Poggi, Marshall Cork, MD  pantoprazole (PROTONIX) 40 MG tablet  02/05/17  Yes [provider]  potassium chloride SA (K-DUR,KLOR-CON) 20 MEQ tablet Take 20 mEq by mouth daily. (0800)   Yes [provider]  pyrithione zinc (HEAD AND SHOULDERS) 1 % shampoo Apply 1 application topically daily. t-gel or head and shoulders.   Yes [provider]  rOPINIRole (REQUIP) 3 MG tablet Take 3 mg by mouth at bedtime. (2000)   Yes [provider]  simvastatin (ZOCOR) 10 MG tablet Take 10 mg by mouth at bedtime. (2000)   Yes [provider]  spironolactone (ALDACTONE) 25 MG tablet Take 25 mg by mouth daily. (0800)   Yes [provider]  traMADol (ULTRAM) 50 MG tablet Take 50 mg by mouth 2 (two) times daily. (0800 & 2000)   Yes  [provider]  vitamin C (ASCORBIC ACID) 500 MG tablet Take 500 mg by mouth 2 (two) times daily. (0800 & 2000)   Yes [provider]  Water For Irrigation, Sterile ([place]) Irrigate with as directed at bedtime. Use with CPAP machine at bedtime during sleep & remove in the morning   Yes [provider]  Xylitol (XYLIMELTS) 500 MG DISK Use as directed 1 capsule in the mouth or throat 3 (three) times daily.   Yes [provider]  zolpidem (AMBIEN) 5 MG tablet Take 5 mg by mouth at bedtime. (2000)   Yes [provider]  diclofenac sodium (VOLTAREN) 1 % GEL Apply 4 g topically every 8 (eight) hours as needed (pain). Apply to right knee Patient not taking: Reported on 02/02/2017 02/02/17   Gillis Santa, MD  tiZANidine (ZANAFLEX) 4 MG tablet Take 1 tablet (4 mg total) by mouth 2 (two) times daily as needed for muscle spasms. Patient not taking: Reported on 02/02/2017 02/02/17   Gillis Santa, MD      VITAL SIGNS:  Blood pressure 134/81, pulse 93, temperature 98.3 F (36.8 C), temperature source Oral, resp. rate 12, height 5' 3"  (1.6 m), weight 224 lb 13.9 oz (102 kg), SpO2 100 %.  PHYSICAL EXAMINATION:  Physical Exam  GENERAL:  66 y.o.-year-old patient lying in the bed with no acute distress. Morbid obesity. EYES: Pupils equal, round, reactive to light and accommodation. No scleral icterus. Extraocular muscles intact.  HEENT: Head atraumatic, normocephalic.  NECK:  Supple, no jugular venous distention. No thyroid enlargement, no tenderness.  LUNGS: Normal breath sounds bilaterally, no wheezing, rales,rhonchi or crepitation. No use of accessory muscles of respiration.  CARDIOVASCULAR: S1, S2 normal. No murmurs, rubs, or gallops.  ABDOMEN: Soft, nontender, nondistended. Bowel sounds present. No organomegaly or mass.  EXTREMITIES: No pedal edema, cyanosis, or clubbing.  NEUROLOGIC: Unable to exam. PSYCHIATRIC: The patient is  Unresponsive to verbal stimuli. SKIN: No obvious rash, lesion, or ulcer.   LABORATORY PANEL:   CBC  Recent Labs Lab 03/07/17 2020  WBC 7.4  HGB 13.1  HCT 37.9  PLT 110*   ------------------------------------------------------------------------------------------------------------------  Chemistries   Recent Labs Lab 03/07/17 2020  NA 140  K 3.8  CL 102  CO2 29  GLUCOSE 100*  BUN 17  CREATININE 0.87  CALCIUM 9.0  AST 35  ALT 31  ALKPHOS 259*  BILITOT 1.1   ------------------------------------------------------------------------------------------------------------------  Cardiac Enzymes  Recent Labs Lab 03/07/17 2028  TROPONINI <0.03   ------------------------------------------------------------------------------------------------------------------  RADIOLOGY:  Ct Angio Head W Or Wo Contrast  Result Date: 03/07/2017 CLINICAL DATA:  Found unresponsive. Altered mental status. Suspect stroke. History of hyperlipidemia, hypertension, bilateral carotid artery stenosis, diabetes. EXAM: CT ANGIOGRAPHY HEAD AND NECK TECHNIQUE: Multidetector CT imaging of the head and neck was performed using the standard protocol during bolus administration of intravenous contrast. Multiplanar CT image reconstructions and MIPs were obtained to evaluate the vascular anatomy. Carotid stenosis measurements (when applicable) are obtained utilizing NASCET criteria, using the distal internal carotid diameter as the denominator. CONTRAST:  75 cc Isovue 370 COMPARISON:  CT HEAD March 07, 2017 at 2042 hours and carotid ultrasound February 10, 2016 FINDINGS: CTA NECK AORTIC ARCH: Normal appearance of the thoracic arch, normal branch pattern.  Mild calcific atherosclerosis aortic arch. The The origins of the innominate, left Common carotid artery and subclavian artery are widely patent. RIGHT CAROTID SYSTEM: Common carotid artery is widely patent, coursing in a straight line fashion. Mild intimal  thickening and calcific atherosclerosis carotid bifurcation without hemodynamically significant stenosis by NASCET criteria. Normal appearance of the internal carotid artery. LEFT CAROTID SYSTEM: Common carotid artery is widely patent, coursing in a straight line fashion. Trace intimal thickening carotid bifurcation without hemodynamically significant stenosis by NASCET criteria. Normal appearance of the internal carotid artery. VERTEBRAL ARTERIES:Codominant vertebral artery's. Normal appearance of the vertebral arteries, which appear widely patent. SKELETON: No acute osseous process though bone windows have not been submitted. C5 through C7 ACDF with arthrodesis. Moderate to severe LEFT C3-4 neural foraminal narrowing. Patient is edentulous. OTHER NECK: Soft tissues of the neck are nonacute though, not tailored for evaluation. UPPER CHEST: Included lung apices are clear. No superior mediastinal lymphadenopathy. Pulmonary vascular congestion. CTA HEAD ANTERIOR CIRCULATION: Patent cervical internal carotid arteries, petrous, cavernous and supra clinoid internal carotid arteries. Widely patent anterior communicating artery. Patent anterior and middle cerebral arteries. No large vessel occlusion, significant stenosis, contrast extravasation or aneurysm. POSTERIOR CIRCULATION: Patent vertebral arteries, vertebrobasilar junction and basilar artery, as well as main branch vessels. Mild calcific atherosclerosis RIGHT V4 segment. Patent posterior cerebral arteries. Small bilateral posterior communicating arteries present. No large vessel occlusion, significant stenosis, contrast extravasation or aneurysm. VENOUS SINUSES: Major dural venous sinuses are patent though not tailored for evaluation on this angiographic examination. ANATOMIC VARIANTS: None. DELAYED PHASE: Not performed. MIP images reviewed. IMPRESSION: CTA NECK: 1. Atherosclerosis without hemodynamically significant stenosis or acute vascular process. 2. Included  chest demonstrates pulmonary vascular congestion. CTA HEAD: 1. No emergent large vessel occlusion or significant stenosis. Electronically Signed   By: Elon Alas M.D.   On: 03/07/2017 22:43   Ct Angio Neck W And/or Wo Contrast  Result Date: 03/07/2017 CLINICAL DATA:  Found unresponsive. Altered mental status. Suspect stroke. History of hyperlipidemia, hypertension, bilateral carotid artery stenosis, diabetes. EXAM: CT ANGIOGRAPHY HEAD AND NECK TECHNIQUE: Multidetector CT imaging of the head and neck was performed using the standard protocol during bolus administration of intravenous contrast. Multiplanar CT image reconstructions and MIPs were obtained to evaluate the vascular anatomy. Carotid stenosis measurements (when applicable) are obtained utilizing NASCET criteria, using the distal internal carotid diameter as the denominator. CONTRAST:  75 cc Isovue 370 COMPARISON:  CT HEAD March 07, 2017 at 2042 hours and carotid ultrasound February 10, 2016 FINDINGS: CTA NECK AORTIC ARCH: Normal appearance of the thoracic arch, normal branch pattern. Mild calcific atherosclerosis aortic arch. The The origins of the innominate, left Common carotid artery and subclavian artery are widely patent. RIGHT CAROTID SYSTEM: Common carotid artery is widely patent, coursing in a straight line fashion. Mild intimal thickening and calcific atherosclerosis carotid bifurcation without hemodynamically significant stenosis by NASCET criteria. Normal appearance of the internal carotid artery. LEFT CAROTID SYSTEM: Common carotid artery is widely patent, coursing in a straight line fashion. Trace intimal thickening carotid bifurcation without hemodynamically significant stenosis by NASCET criteria. Normal appearance of the internal carotid artery. VERTEBRAL ARTERIES:Codominant vertebral artery's. Normal appearance of the vertebral arteries, which appear widely patent. SKELETON: No acute osseous process though bone windows have not  been submitted. C5 through C7 ACDF with arthrodesis. Moderate to severe LEFT C3-4 neural foraminal narrowing. Patient is edentulous. OTHER NECK: Soft tissues of the neck are nonacute though, not tailored for evaluation. UPPER CHEST: Included lung apices are  clear. No superior mediastinal lymphadenopathy. Pulmonary vascular congestion. CTA HEAD ANTERIOR CIRCULATION: Patent cervical internal carotid arteries, petrous, cavernous and supra clinoid internal carotid arteries. Widely patent anterior communicating artery. Patent anterior and middle cerebral arteries. No large vessel occlusion, significant stenosis, contrast extravasation or aneurysm. POSTERIOR CIRCULATION: Patent vertebral arteries, vertebrobasilar junction and basilar artery, as well as main branch vessels. Mild calcific atherosclerosis RIGHT V4 segment. Patent posterior cerebral arteries. Small bilateral posterior communicating arteries present. No large vessel occlusion, significant stenosis, contrast extravasation or aneurysm. VENOUS SINUSES: Major dural venous sinuses are patent though not tailored for evaluation on this angiographic examination. ANATOMIC VARIANTS: None. DELAYED PHASE: Not performed. MIP images reviewed. IMPRESSION: CTA NECK: 1. Atherosclerosis without hemodynamically significant stenosis or acute vascular process. 2. Included chest demonstrates pulmonary vascular congestion. CTA HEAD: 1. No emergent large vessel occlusion or significant stenosis. Electronically Signed   By: Elon Alas M.D.   On: 03/07/2017 22:43   Dg Chest Portable 1 View  Result Date: 03/07/2017 CLINICAL DATA:  Acute mental status change. EXAM: PORTABLE CHEST 1 VIEW COMPARISON:  December 21, 2016 FINDINGS: Cardiomegaly. No overt edema. Atelectasis in the bases. No suspicious infiltrate. IMPRESSION: Cardiomegaly. No acute abnormality. Probable atelectasis in the bases. Electronically Signed   By: Dorise Bullion III M.D   On: 03/07/2017 21:11   Ct Head Code  Stroke Wo Contrast  Result Date: 03/07/2017 CLINICAL DATA:  Code stroke.  Altered mental status EXAM: CT HEAD WITHOUT CONTRAST TECHNIQUE: Contiguous axial images were obtained from the base of the skull through the vertex without intravenous contrast. COMPARISON:  Head CT 07/19/2016 FINDINGS: Brain: No mass lesion or acute hemorrhage. No focal hypoattenuation of the basal ganglia or cortex to indicate infarcted tissue. No hydrocephalus or age advanced atrophy. Vascular: No hyperdense vessel. No advanced atherosclerotic calcification of the arteries at the skull base. Skull: Normal visualized skull base, calvarium and extracranial soft tissues. Sinuses/Orbits: No sinus fluid levels or advanced mucosal thickening. No mastoid effusion. Normal orbits. ASPECTS [facility] Stroke Program Early CT Score) - Ganglionic level infarction (caudate, lentiform nuclei, internal capsule, insula, M1-M3 cortex): 7 - Supraganglionic infarction (M4-M6 cortex): 3 Total score (0-10 with 10 being normal): 10 IMPRESSION: 1. Normal head CT. 2. ASPECTS is 10. These results were called by telephone at the time of interpretation on 03/07/2017 at 8:55 pm to Dr. Arta Silence , who verbally acknowledged these results. Electronically Signed   By: Ulyses Jarred M.D.   On: 03/07/2017 20:56      IMPRESSION AND PLAN:    Unresponsiveness and acute med tablet encephalopathy. The patient will be placed for observation. Aspiration and fall precaution, hold Klonopin, Neurontin and Lyrica, hold Remeron, Ambien and tramadol.  Pharmacy consult for possible polypharmacy.  Hypertension, continue hypertension medication. Diabetes. Continue Lantus and start a sliding scale.  All the records are reviewed and case discussed with ED provider. Management plans discussed with the patient, family and they are in agreement.  CODE STATUS: Full code for now.  TOTAL TIME TAKING CARE OF THIS PATIENT: 52 minutes.    Demetrios Loll M.D on 03/08/2017 at  1:43 AM  Between 7am to 6pm - Pager - [phone]  After 6pm go to [email or web address] - Proofreader  [facility]  Office  [phone]  CC: Primary care physician; Sarajane Jews, MD   Note: This dictation was prepared with Dragon dictation along with smaller phrase technology. Any transcriptional errors that result from this process are unin

## 2017-03-09 ENCOUNTER — Ambulatory Visit: Payer: Medicare Other

## 2017-03-09 DIAGNOSIS — R4182 Altered mental status, unspecified: Secondary | ICD-10-CM | POA: Diagnosis not present

## 2017-03-09 LAB — GLUCOSE, CAPILLARY
GLUCOSE-CAPILLARY: 124 mg/dL — AB (ref 65–99)
GLUCOSE-CAPILLARY: 106 mg/dL — AB (ref 65–99)
GLUCOSE-CAPILLARY: 165 mg/dL — AB (ref 65–99)
GLUCOSE-CAPILLARY: 147 mg/dL — AB (ref 65–99)

## 2017-03-09 IMAGING — CR DG CHEST 2V
1 series · 2 of 2 positions shown · non-contrast
Comparison: Chest x-ray of April 18, 2016

CLINICAL DATA: L status change, un [REDACTED] with speech,
weakness, hypoglycemia. History of previous CVA, diabetes, asthma,
cirrhosis, discontinued smoking four years ago after many years.

EXAM:
CHEST  2 VIEW

[Series 1: x chest ap · 0.14mm/px · 2 of 2 slices shown]
[im 1/2]
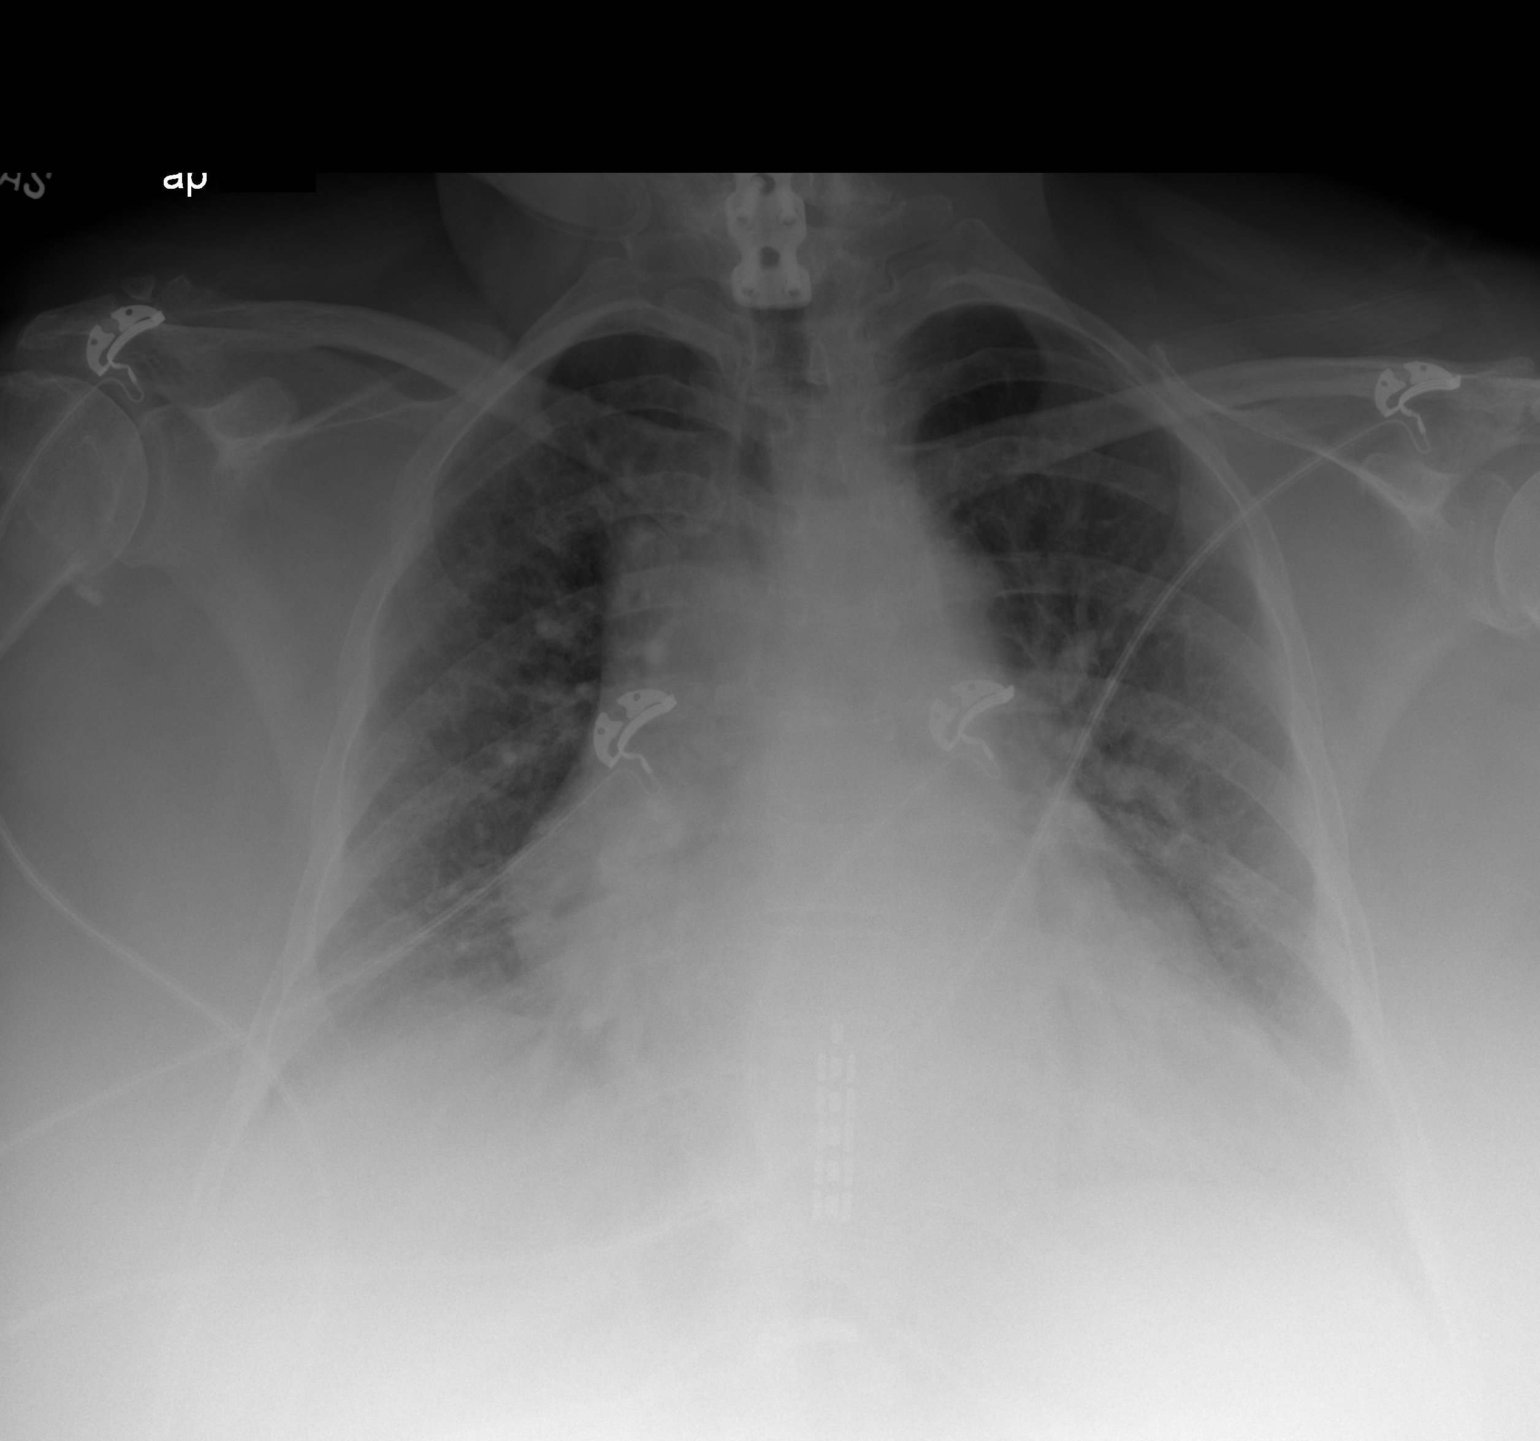
[im 2/2]
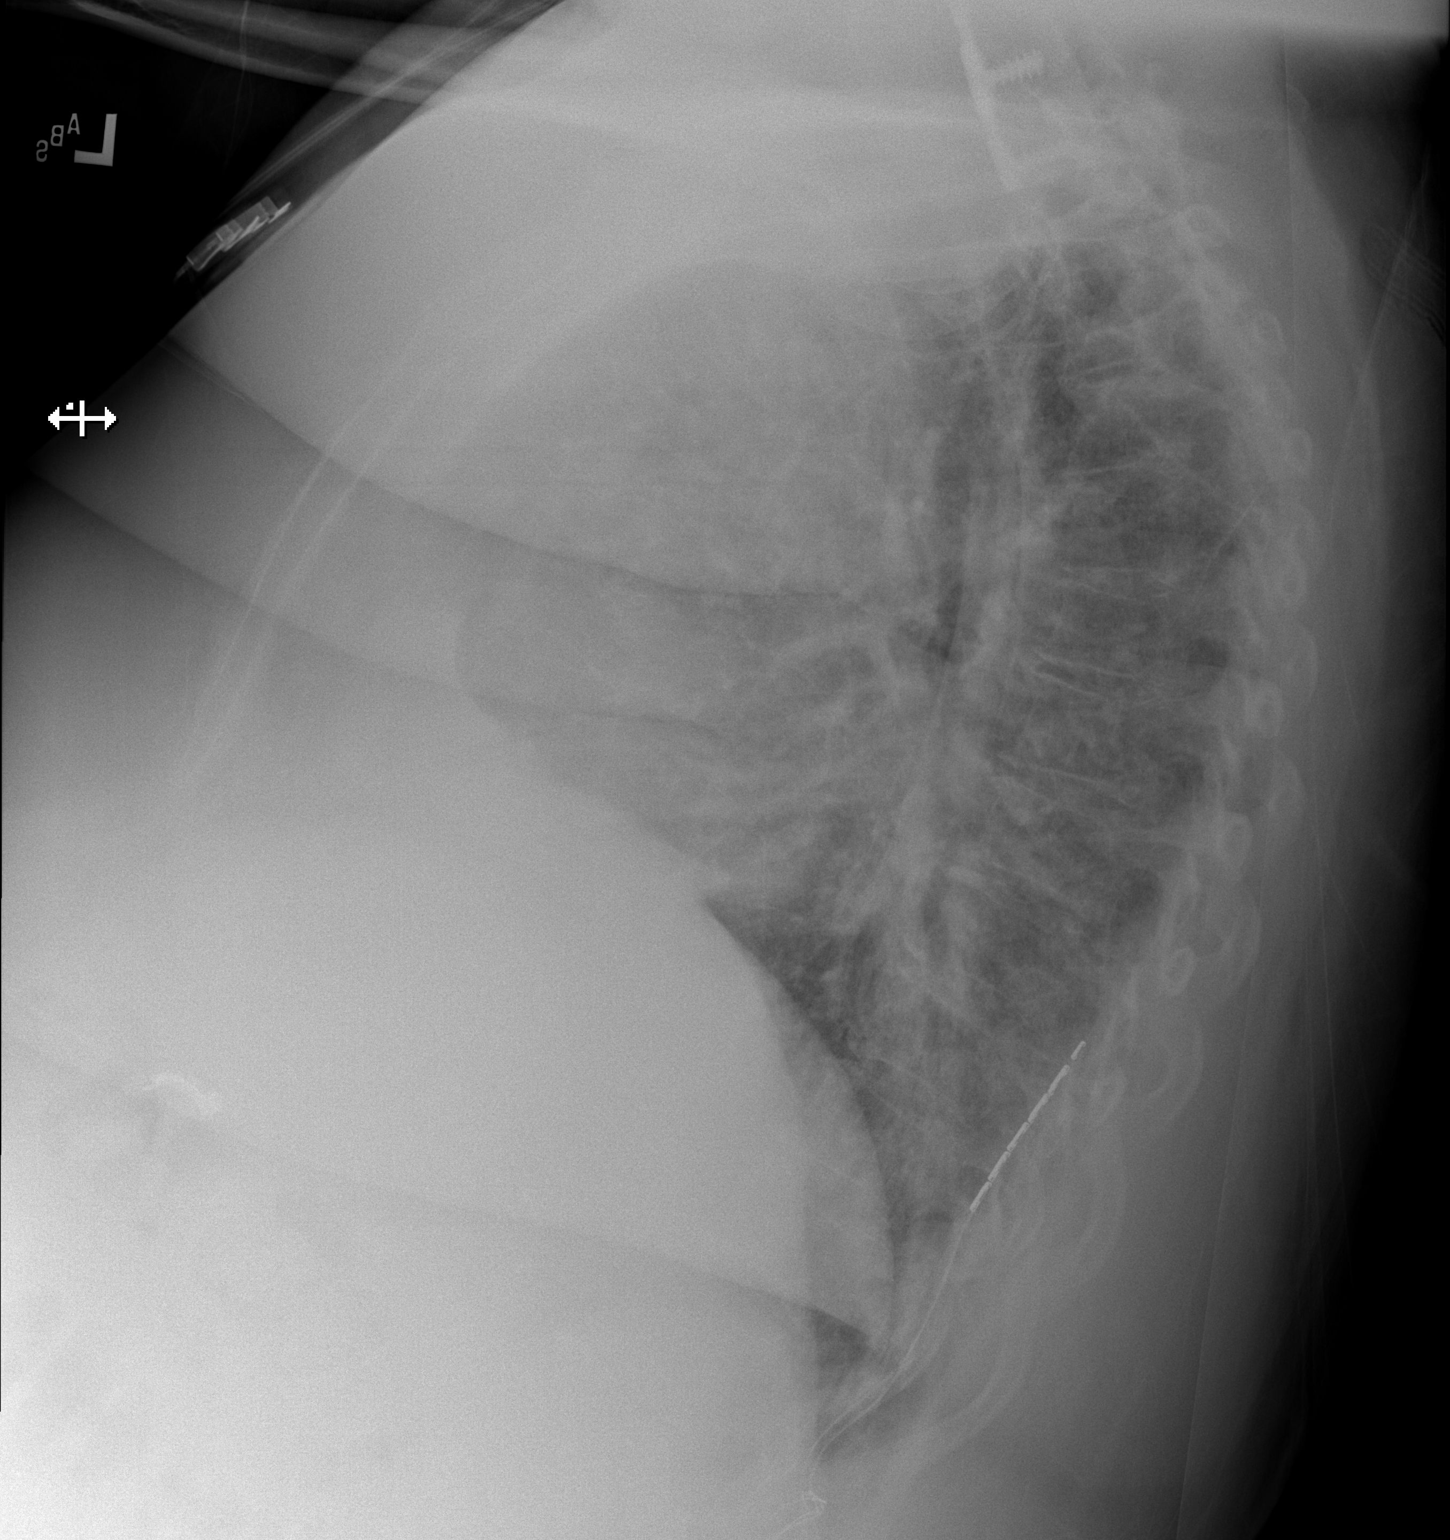

[2 of 2 positions shown; findings below may reference images not displayed]

FINDINGS: The lungs are reasonably well inflated. The interstitial markings
are coarse. There is no alveolar infiltrate or pleural effusion. The
cardiac silhouette is enlarged. The pulmonary vascularity is mildly
engorged. The bony thorax exhibits no acute abnormality. A nerve
stimulator electrode projects over the lower thoracic spinal canal.
IMPRESSION: CHF superimposed upon COPD and mild chronic interstitial prominence.
No definite acute pneumonia.

## 2017-03-09 IMAGING — CT CT HEAD W/O CM
3 series · 15 of 47 positions shown, 18 images · non-contrast
Comparison: Head CT scan 07/14/2016 and 03/21/2015.

CLINICAL DATA: Altered mental status beginning this morning.
Altered speech.

EXAM:
CT HEAD WITHOUT CONTRAST
TECHNIQUE: Contiguous axial images were obtained from the base of the skull
through the vertex without intravenous contrast.

[Series 2: head wo · axial · 0.41mm/px · z∈[-122,+3]mm · 9 of 30 slices shown, 12 images]
[im 3/30  brain]
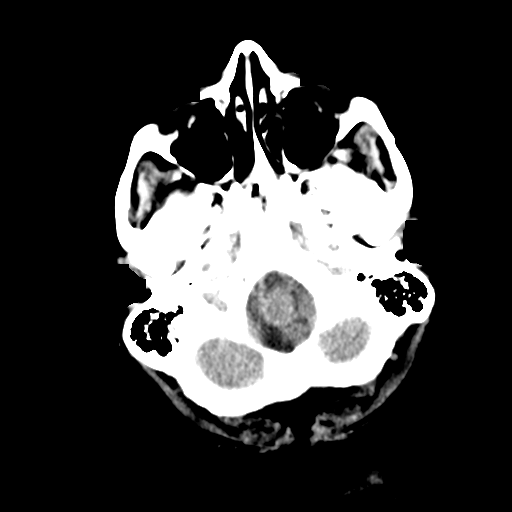
[im 3/30  bone]
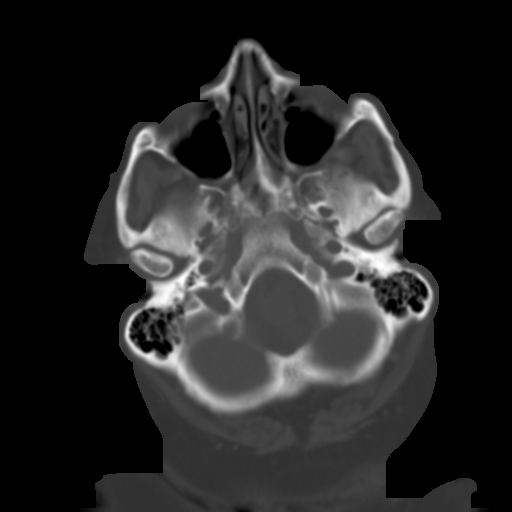
[im 6/30  brain]
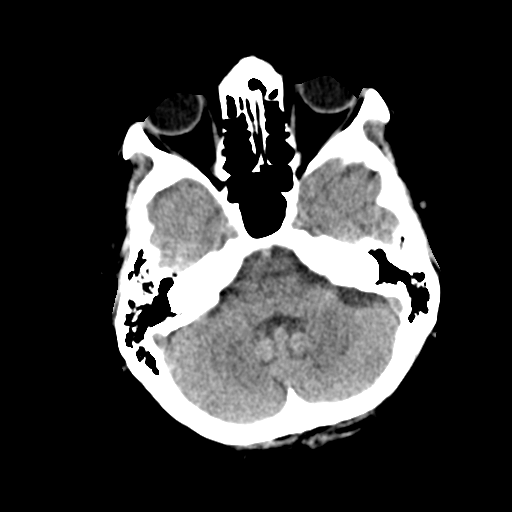
[im 9/30  brain]
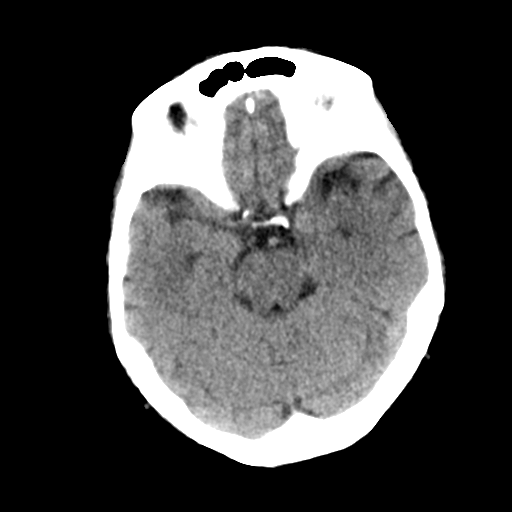
[im 12/30  brain]
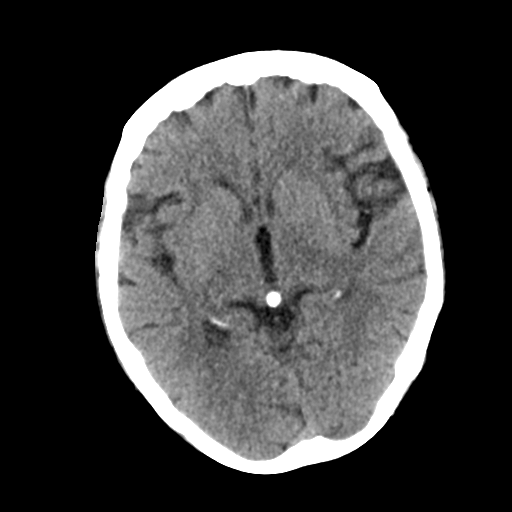
[im 16/30  brain]
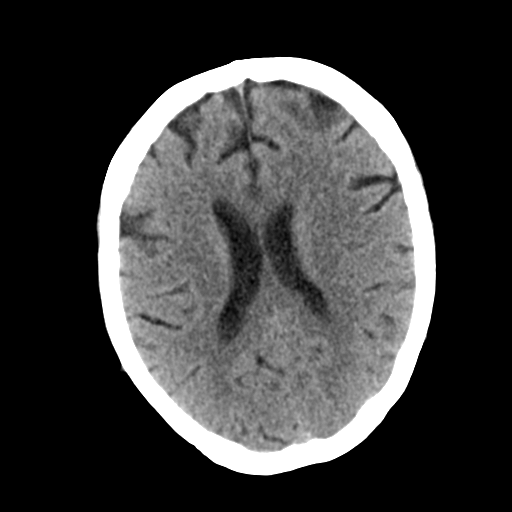
[im 16/30  bone]
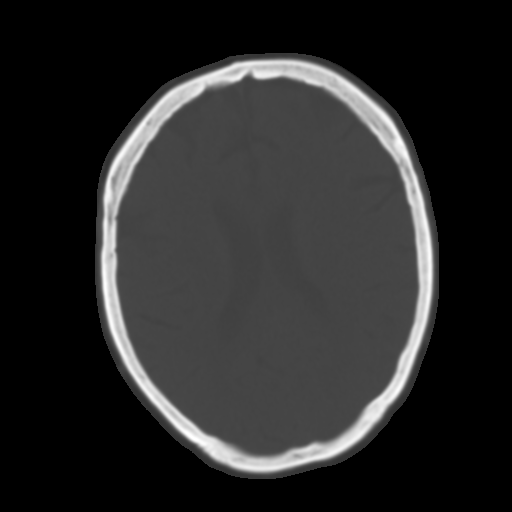
[im 19/30  brain]
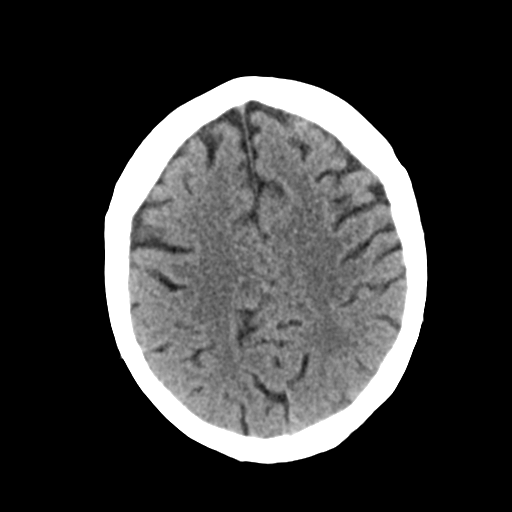
[im 22/30  brain]
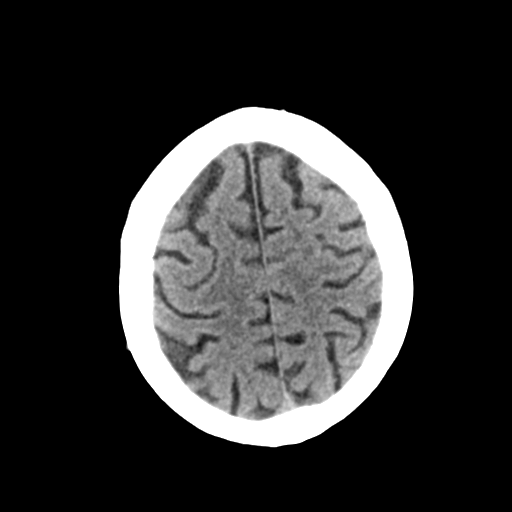
[im 25/30  brain]
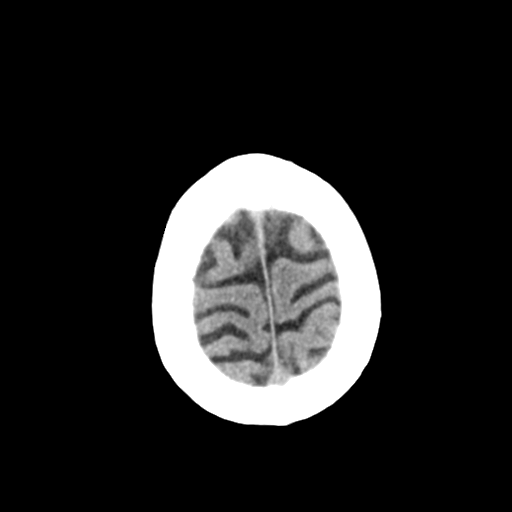
[im 28/30  brain]
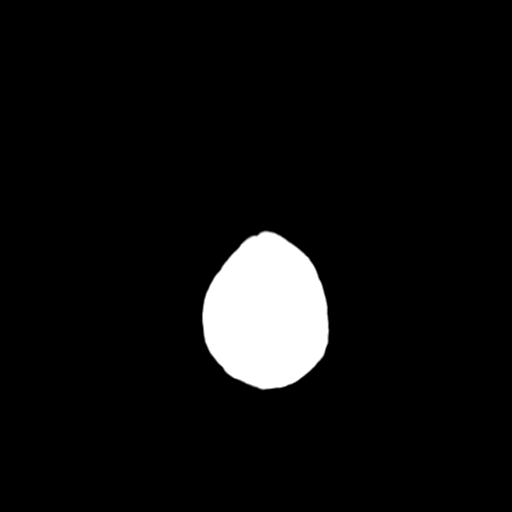
[im 28/30  bone]
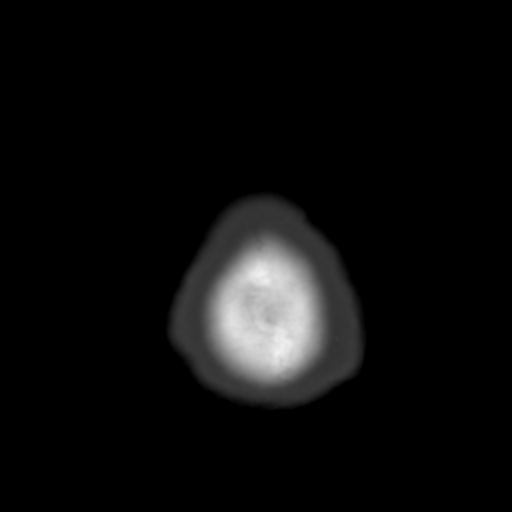

[Series 4: coronal soft tissue · coronal · 0.33mm/px · 3 of 59 slices shown]
[im 20/59  brain]
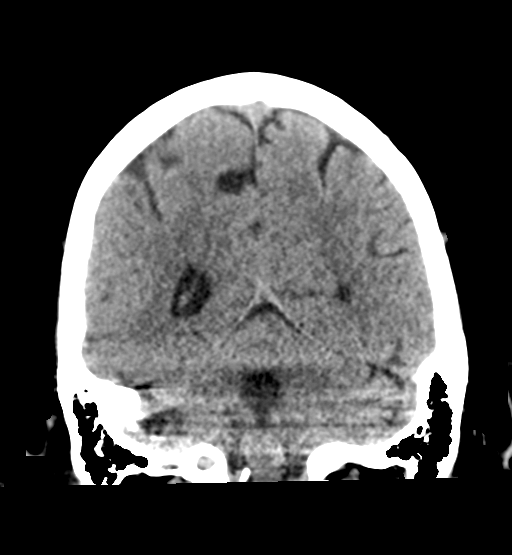
[im 26/59  brain]
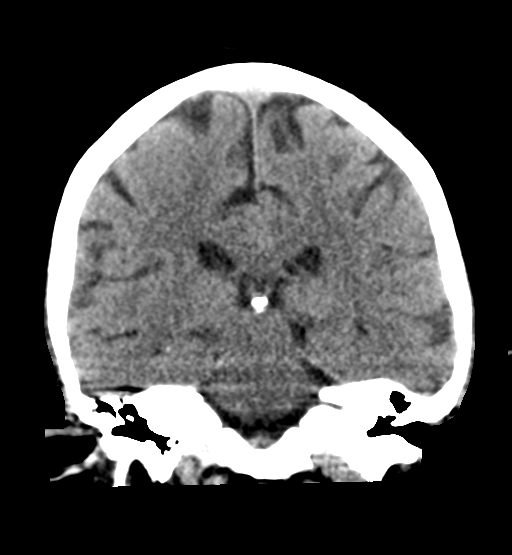
[im 33/59  brain]
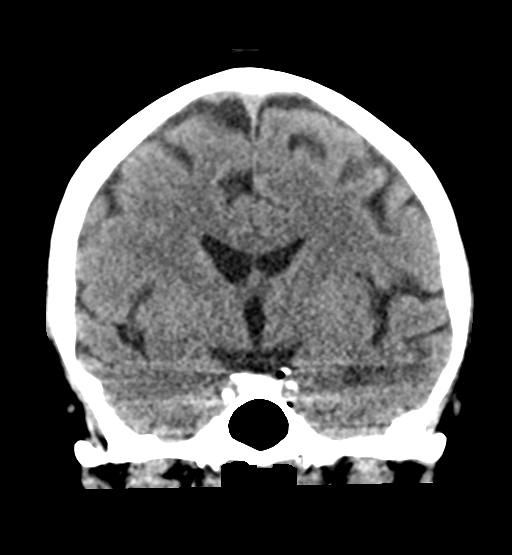

[Series 5: sagittal soft tissue · sagittal · 0.28mm/px · 3 of 48 slices shown]
[im 16/48  brain]
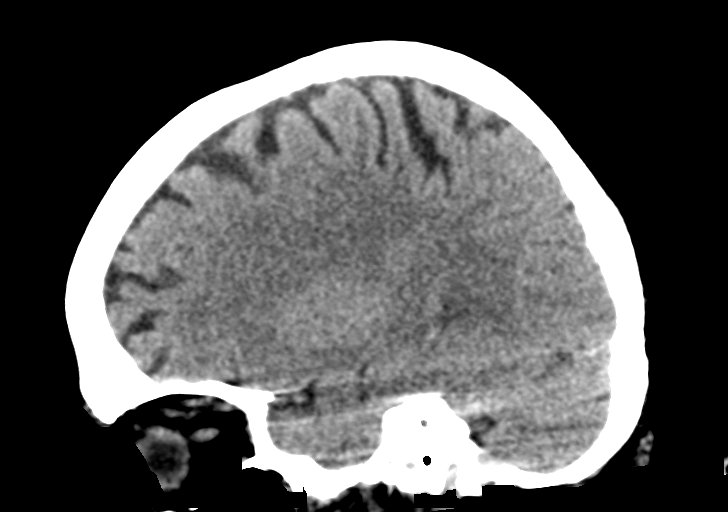
[im 24/48  brain]
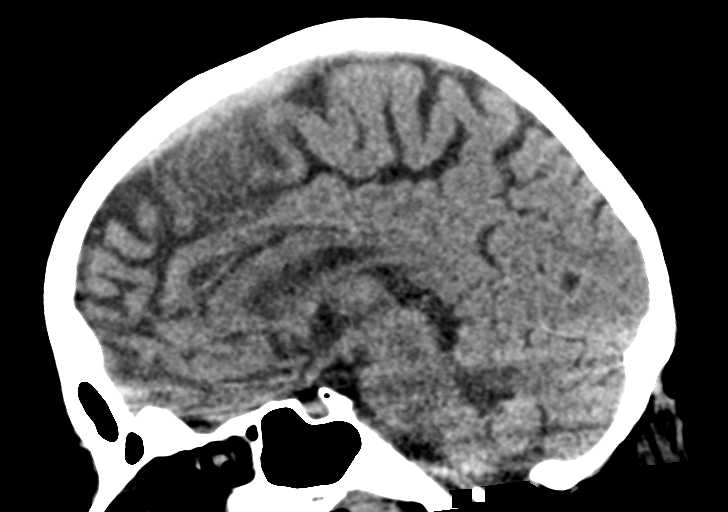
[im 32/48  brain]
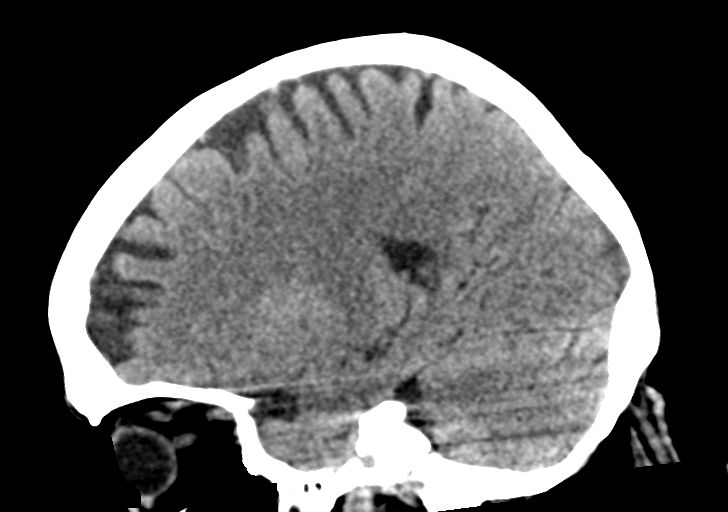

[15 of 47 positions shown; findings below may reference images not displayed]

FINDINGS: Brain: Appears normal without hemorrhage, infarct, mass lesion, mass
effect, midline shift or abnormal extra-axial fluid collection. No
hydrocephalus or pneumocephalus.

Vascular: Atherosclerosis noted.

Skull: Intact.

Sinuses/Orbits: Status post bilateral lens extraction. Otherwise
negative.

Other: None.
IMPRESSION: No acute abnormality.

Atherosclerosis.

## 2017-03-09 NOTE — Op Note (Signed)
ORTHOPAEDIC CONSULTATION  REQUESTING PHYSICIAN: Epifanio Lesches, MD  Chief Complaint: Left knee pain status post fall  HPI: Stephanie [name] is a 66 y.o. female who complains of  left knee pain. She explains that she had a recent fall. Since that time has been painful for her to try to weight-bear on the left lower extremity. Orthopedics is consulted for her left knee pain.  An MRI was performed earlier today which showed a nondisplaced fracture involving the medial aspect of the distal femur.  Patient is seen in her hospital room this evening. She is complaining of left knee pain particularly with any movement of the left knee.  She states the left lower extremity feels weak but has no loss of sensation.  Past Medical History:  Diagnosis Date  . Adenomatous colon polyp 08/27/2014   Polyps x 3  . Anemia   . Anxiety   . 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 Arthritis   . Asthma   . Bipolar affective disorder ([place])    h/o  . Blood transfusion 2000  . Blood transfusion without reported diagnosis   . Cancer ([place])   . Chronic abdominal pain    abdominal wall pain  . 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 Colon polyp   . Depression   . Diabetes mellitus without complication ([place])   . Fatty liver 04/09/08   found in abd CT  . GERD (gastroesophageal reflux disease)   . H/O: CVA (cardiovascular accident)    TIA  . H/O: rheumatic fever   . Hepatitis    HEP "B" twenty years ago  . Hyperlipidemia   . Incontinence   . Insomnia   . Lower extremity edema   . Obstructive sleep apnea    Use C-PAP twice per week  . Platelets decreased ([place])   . RLS (restless legs syndrome)   . Shortness of breath   . Sleep apnea   . Stroke [facility])    TIA- 2002  . Ulcer   . Unspecified disorders of nervous system    Past Surgical History:  Procedure Laterality Date  . ABDOMINAL EXPLORATION SURGERY    . ABDOMINAL HYSTERECTOMY     total  . Axillary artery cannulation via 8-mm Hemashield  graft, median sternotomy, extracorporeal circulation with deep hypothermic circulatory arrest, repair of  aortic dessection  01/23/2009   Dr Arlyce Dice  . BACK SURGERY      x 5  . cataract     both eyes  . CHOLECYSTECTOMY    . COLONOSCOPY    . EYE SURGERY Bilateral    Cataract Extraction with IOL  . HEMORROIDECTOMY     and colon polyp removed  . KNEE ARTHROSCOPY WITH LATERAL RELEASE Right 11/15/2016   Procedure: KNEE ARTHROSCOPY WITH LATERAL RELEASE;  Surgeon: Corky Mull, MD;  Location: [facility] OR[facility];  Service: Orthopedics;  Laterality: Right;  . LIVER BIOPSY  04/10/2012   Procedure: LIVER BIOPSY;  Surgeon: Inda Castle, MD;  Location: [facility];  Service: Endoscopy;  Laterality: N/A;  ultrasound to mark order for abd limited/liver to be marked to be sent by [email or web address]   . LUMBAR FUSION  10/09  . LUMBAR WOUND DEBRIDEMENT  05/09/2011   Procedure: LUMBAR WOUND DEBRIDEMENT;  Surgeon: Dahlia Bailiff;  Location: [facility];  Service: Orthopedics;  Laterality: N/A;  IRRIGATION AND DEBRIDEMENT SPINAL WOUND  . POSTERIOR CERVICAL FUSION/FORAMINOTOMY    . ROTATOR CUFF REPAIR     left  Social History   Social History  . Marital status: Divorced    Spouse name: N/A  . Number of children: 2  . Years of education: N/A   Occupational History  . Disabled Disability   Social History Main Topics  . Smoking status: Former Smoker    Packs/day: 1.50    Years: 50.00    Types: Cigarettes    Quit date: 04/06/2013  . Smokeless tobacco: Never Used  . Alcohol use No  . Drug use: No  . Sexual activity: No   Other Topics Concern  . None   Social History Narrative   1 son, 89+ y/o   Daily caffeine use: 2 cups daily   Does not get regular exercise   Disability due to bipolar      Has a living will- she is a DNR (she has form at home per pt).   Family History  Problem Relation Age of Onset  . Breast cancer Mother   . Lung cancer Mother   . Stomach cancer Father   . Diabetes Unknown        4  aunts, and 1 uncle  . Colon cancer Neg Hx   . Esophageal cancer Neg Hx   . Rectal cancer Neg Hx    Allergies  Allergen Reactions  . Doxycycline Hives, Swelling and Other (See Comments)    Reaction:  Facial swelling    Prior to Admission medications   Medication Sig Start Date End Date Taking? Authorizing Provider  acetaminophen (TYLENOL) 325 MG tablet Take 650 mg by mouth every 4 (four) hours as needed (for fever, minor discomfort, or headache).    Yes [provider]  albuterol (PROVENTIL HFA;VENTOLIN HFA) 108 (90 Base) MCG/ACT inhaler Inhale 2 puffs into the lungs every 6 (six) hours as needed for wheezing or shortness of breath. 07/28/16  Yes Alfred Levins, [place], MD  albuterol (PROVENTIL) (2.5 MG/3ML) 0.083% nebulizer solution Take 2.5 mg by nebulization every 6 (six) hours as needed for wheezing or shortness of breath.   Yes [provider]  ALPRAZolam Duanne Moron) 0.25 MG tablet Take by mouth.   Yes [provider]  Alum & Mag Hydroxide-Simeth (ALMACONE PO) Take 30 mLs by mouth every 6 (six) hours as needed.   Yes [provider]  aspirin 325 MG tablet Take by mouth.   Yes [provider]  atorvastatin (LIPITOR) 40 MG tablet  02/05/17  Yes [provider]  barrier cream (NON-SPECIFIED) CREA Apply 1 application topically as needed (APPLIED AFTER TOILETING TO PREVENT SKIN BREAKDOWN).   Yes [provider]  budesonide-formoterol (SYMBICORT) 160-4.5 MCG/ACT inhaler Inhale 2 puffs into the lungs 2 (two) times daily. (0800 & 2000)   Yes [provider]  buPROPion (WELLBUTRIN SR) 150 MG 12 hr tablet Take 150 mg by mouth daily. (0800)   Yes [provider]  clonazePAM (KLONOPIN) 0.25 MG disintegrating tablet 0.25 mg 3 (three) times daily.  01/12/17  Yes [provider]  clonazePAM (KLONOPIN) 0.5 MG tablet Take 0.25 mg by mouth 3 (three) times daily. (0800, 1300 & 2000)   Yes [provider]  dicyclomine  (BENTYL) 10 MG capsule Take 10 mg by mouth 3 (three) times daily. (0800, 1300 & 2000)   Yes [provider]  diphenhydrAMINE (DIPHENHIST) 25 mg capsule Take 25 mg by mouth every 6 (six) hours as needed (for allergic reaction (notify provider is symptoms persist more than 24 hrs*).   Yes [provider]  Emollient ([place])  LOTN Apply 1 application topically 2 (two) times daily.   Yes [provider]  ferrous sulfate 325 (65 FE) MG tablet Take 325 mg by mouth 2 (two) times daily. (0800 & 2000)   Yes [provider]  furosemide (LASIX) 80 MG tablet Take 80 mg by mouth daily. (0800)   Yes [provider]  gabapentin (NEURONTIN) 300 MG capsule  02/04/17  Yes [provider]  gabapentin (NEURONTIN) 600 MG tablet Take 600 mg by mouth 2 (two) times daily. (0800 & 2000)   Yes [provider]  guaifenesin (ROBITUSSIN) 100 MG/5ML syrup Take 300 mg by mouth every 6 (six) hours as needed for cough.    Yes [provider]  insulin glargine (LANTUS) 100 UNIT/ML injection Inject 28 Units into the skin at bedtime.   Yes [provider]  insulin lispro (HUMALOG KWIKPEN) 100 UNIT/ML KiwkPen Inject 0-9 Units into the skin 3 (three) times daily. Check FSBS before each meal & cover with SSI (sliding scale insulin)   Yes [provider]  lactulose (CHRONULAC) 10 GM/15ML solution Take 10 g by mouth 3 (three) times a week. 10/17/16  Yes [provider]  lamoTRIgine (LAMICTAL) 100 MG tablet Take 100 mg by mouth 2 (two) times daily. (0800 & 2000)   Yes [provider]  lidocaine (LIDODERM) 5 % Place 3 patches onto the skin daily.  02/01/17  Yes [provider]  loperamide (IMODIUM) 2 MG capsule Take 4 mg by mouth as needed for diarrhea or loose stools. *Do not exceed 8 doses in 24 hours*   Yes [provider]  LORazepam (ATIVAN) 0.5 MG tablet Take 0.5 mg by mouth every 8 (eight) hours.   Yes  [provider]  LYRICA 150 MG capsule Take 150 mg by mouth 3 (three) times daily. 11/29/16  Yes [provider]  magnesium hydroxide (MILK OF MAGNESIA) 400 MG/5ML suspension Take 30 mLs by mouth daily as needed (for constipation).   Yes [provider]  Menthol-Zinc Oxide (GOLD BOND EX) Apply 1 application topically daily.   Yes [provider]  metoCLOPramide (REGLAN) 5 MG tablet Take 5 mg by mouth 4 (four) times daily -  before meals and at bedtime. (0800, 1200, 1600 & 2000)   Yes [provider]  mirtazapine (REMERON) 15 MG tablet Take 45 mg by mouth at bedtime. (2000)    Yes [provider]  mirtazapine (REMERON) 45 MG tablet  02/05/17  Yes [provider]  omeprazole (PRILOSEC) 20 MG capsule Take 20 mg by mouth daily before breakfast. (0630)   Yes [provider]  ondansetron (ZOFRAN) 4 MG tablet Take 4 mg by mouth every 4 (four) hours as needed for nausea or vomiting.   Yes [provider]  oxybutynin (DITROPAN-XL) 5 MG 24 hr tablet Take 5 mg by mouth daily. (0800)   Yes [provider]  oxyCODONE (ROXICODONE) 5 MG immediate release tablet Take 1 tablet (5 mg total) by mouth every 4 (four) hours as needed for severe pain. 11/15/16  Yes Poggi, Marshall Cork, MD  pantoprazole (PROTONIX) 40 MG tablet  02/05/17  Yes [provider]  potassium chloride SA (K-DUR,KLOR-CON) 20 MEQ tablet Take 20 mEq by mouth daily. (0800)   Yes [provider]  pyrithione zinc (HEAD AND SHOULDERS) 1 % shampoo Apply 1 application topically daily. t-gel or head and shoulders.   Yes [provider]  rOPINIRole (REQUIP) 3 MG tablet Take 3 mg by mouth at  bedtime. (2000)   Yes [provider]  simvastatin (ZOCOR) 10 MG tablet Take 10 mg by mouth at bedtime. (2000)   Yes [provider]  spironolactone (ALDACTONE) 25 MG tablet Take 25 mg by mouth daily. (0800)   Yes [provider]  traMADol  (ULTRAM) 50 MG tablet Take 50 mg by mouth 2 (two) times daily. (0800 & 2000)   Yes [provider]  vitamin C (ASCORBIC ACID) 500 MG tablet Take 500 mg by mouth 2 (two) times daily. (0800 & 2000)   Yes [provider]  Water For Irrigation, Sterile ([place]) Irrigate with as directed at bedtime. Use with CPAP machine at bedtime during sleep & remove in the morning   Yes [provider]  Xylitol (XYLIMELTS) 500 MG DISK Use as directed 1 capsule in the mouth or throat 3 (three) times daily.   Yes [provider]  zolpidem (AMBIEN) 5 MG tablet Take 5 mg by mouth at bedtime. (2000)   Yes [provider]  diclofenac sodium (VOLTAREN) 1 % GEL Apply 4 g topically every 8 (eight) hours as needed (pain). Apply to right knee Patient not taking: Reported on 02/02/2017 02/02/17   Gillis Santa, MD  tiZANidine (ZANAFLEX) 4 MG tablet Take 1 tablet (4 mg total) by mouth 2 (two) times daily as needed for muscle spasms. Patient not taking: Reported on 02/02/2017 02/02/17   Gillis Santa, MD   Ct Angio Head W Or Wo Contrast  Result Date: 03/07/2017 CLINICAL DATA:  Found unresponsive. Altered mental status. Suspect stroke. History of hyperlipidemia, hypertension, bilateral carotid artery stenosis, diabetes. EXAM: CT ANGIOGRAPHY HEAD AND NECK TECHNIQUE: Multidetector CT imaging of the head and neck was performed using the standard protocol during bolus administration of intravenous contrast. Multiplanar CT image reconstructions and MIPs were obtained to evaluate the vascular anatomy. Carotid stenosis measurements (when applicable) are obtained utilizing NASCET criteria, using the distal internal carotid diameter as the denominator. CONTRAST:  75 cc Isovue 370 COMPARISON:  CT HEAD March 07, 2017 at 2042 hours and carotid ultrasound February 10, 2016 FINDINGS: CTA NECK AORTIC ARCH: Normal appearance of the thoracic arch, normal branch pattern. Mild calcific  atherosclerosis aortic arch. The The origins of the innominate, left Common carotid artery and subclavian artery are widely patent. RIGHT CAROTID SYSTEM: Common carotid artery is widely patent, coursing in a straight line fashion. Mild intimal thickening and calcific atherosclerosis carotid bifurcation without hemodynamically significant stenosis by NASCET criteria. Normal appearance of the internal carotid artery. LEFT CAROTID SYSTEM: Common carotid artery is widely patent, coursing in a straight line fashion. Trace intimal thickening carotid bifurcation without hemodynamically significant stenosis by NASCET criteria. Normal appearance of the internal carotid artery. VERTEBRAL ARTERIES:Codominant vertebral artery's. Normal appearance of the vertebral arteries, which appear widely patent. SKELETON: No acute osseous process though bone windows have not been submitted. C5 through C7 ACDF with arthrodesis. Moderate to severe LEFT C3-4 neural foraminal narrowing. Patient is edentulous. OTHER NECK: Soft tissues of the neck are nonacute though, not tailored for evaluation. UPPER CHEST: Included lung apices are clear. No superior mediastinal lymphadenopathy. Pulmonary vascular congestion. CTA HEAD ANTERIOR CIRCULATION: Patent cervical internal carotid arteries, petrous, cavernous and supra clinoid internal carotid arteries. Widely patent anterior communicating artery. Patent anterior and middle cerebral arteries. No large vessel occlusion, significant stenosis, contrast extravasation or aneurysm. POSTERIOR CIRCULATION: Patent vertebral arteries, vertebrobasilar junction and basilar artery, as well as main branch vessels. Mild calcific atherosclerosis RIGHT V4 segment. Patent posterior  cerebral arteries. Small bilateral posterior communicating arteries present. No large vessel occlusion, significant stenosis, contrast extravasation or aneurysm. VENOUS SINUSES: Major dural venous sinuses are patent though not tailored for  evaluation on this angiographic examination. ANATOMIC VARIANTS: None. DELAYED PHASE: Not performed. MIP images reviewed. IMPRESSION: CTA NECK: 1. Atherosclerosis without hemodynamically significant stenosis or acute vascular process. 2. Included chest demonstrates pulmonary vascular congestion. CTA HEAD: 1. No emergent large vessel occlusion or significant stenosis. Electronically Signed   By: Elon Alas M.D.   On: 03/07/2017 22:43   Ct Angio Neck W And/or Wo Contrast  Result Date: 03/07/2017 CLINICAL DATA:  Found unresponsive. Altered mental status. Suspect stroke. History of hyperlipidemia, hypertension, bilateral carotid artery stenosis, diabetes. EXAM: CT ANGIOGRAPHY HEAD AND NECK TECHNIQUE: Multidetector CT imaging of the head and neck was performed using the standard protocol during bolus administration of intravenous contrast. Multiplanar CT image reconstructions and MIPs were obtained to evaluate the vascular anatomy. Carotid stenosis measurements (when applicable) are obtained utilizing NASCET criteria, using the distal internal carotid diameter as the denominator. CONTRAST:  75 cc Isovue 370 COMPARISON:  CT HEAD March 07, 2017 at 2042 hours and carotid ultrasound February 10, 2016 FINDINGS: CTA NECK AORTIC ARCH: Normal appearance of the thoracic arch, normal branch pattern. Mild calcific atherosclerosis aortic arch. The The origins of the innominate, left Common carotid artery and subclavian artery are widely patent. RIGHT CAROTID SYSTEM: Common carotid artery is widely patent, coursing in a straight line fashion. Mild intimal thickening and calcific atherosclerosis carotid bifurcation without hemodynamically significant stenosis by NASCET criteria. Normal appearance of the internal carotid artery. LEFT CAROTID SYSTEM: Common carotid artery is widely patent, coursing in a straight line fashion. Trace intimal thickening carotid bifurcation without hemodynamically significant stenosis by NASCET  criteria. Normal appearance of the internal carotid artery. VERTEBRAL ARTERIES:Codominant vertebral artery's. Normal appearance of the vertebral arteries, which appear widely patent. SKELETON: No acute osseous process though bone windows have not been submitted. C5 through C7 ACDF with arthrodesis. Moderate to severe LEFT C3-4 neural foraminal narrowing. Patient is edentulous. OTHER NECK: Soft tissues of the neck are nonacute though, not tailored for evaluation. UPPER CHEST: Included lung apices are clear. No superior mediastinal lymphadenopathy. Pulmonary vascular congestion. CTA HEAD ANTERIOR CIRCULATION: Patent cervical internal carotid arteries, petrous, cavernous and supra clinoid internal carotid arteries. Widely patent anterior communicating artery. Patent anterior and middle cerebral arteries. No large vessel occlusion, significant stenosis, contrast extravasation or aneurysm. POSTERIOR CIRCULATION: Patent vertebral arteries, vertebrobasilar junction and basilar artery, as well as main branch vessels. Mild calcific atherosclerosis RIGHT V4 segment. Patent posterior cerebral arteries. Small bilateral posterior communicating arteries present. No large vessel occlusion, significant stenosis, contrast extravasation or aneurysm. VENOUS SINUSES: Major dural venous sinuses are patent though not tailored for evaluation on this angiographic examination. ANATOMIC VARIANTS: None. DELAYED PHASE: Not performed. MIP images reviewed. IMPRESSION: CTA NECK: 1. Atherosclerosis without hemodynamically significant stenosis or acute vascular process. 2. Included chest demonstrates pulmonary vascular congestion. CTA HEAD: 1. No emergent large vessel occlusion or significant stenosis. Electronically Signed   By: Elon Alas M.D.   On: 03/07/2017 22:43   Mr Brain Wo Contrast  Result Date: 03/08/2017 CLINICAL DATA:  Altered mental status EXAM: MRI HEAD WITHOUT CONTRAST TECHNIQUE: Multiplanar, multiecho pulse sequences of  the brain and surrounding structures were obtained without intravenous contrast. COMPARISON:  CTA head 03/07/2017 FINDINGS: Brain: The midline structures are normal. There is no focal diffusion restriction to indicate acute infarct. The brain parenchymal signal is normal  and there is no mass lesion. No intraparenchymal hematoma or chronic microhemorrhage. Brain volume is normal for age without lobar predominant atrophy. The dura is normal and there is no extra-axial collection. Vascular: Major intracranial arterial and venous sinus flow voids are preserved. Skull and upper cervical spine: The visualized skull base, calvarium, upper cervical spine and extracranial soft tissues are normal. Sinuses/Orbits: No fluid levels or advanced mucosal thickening. No mastoid or middle ear effusion. Normal orbits. IMPRESSION: Normal MRI of the brain. Electronically Signed   By: Ulyses Jarred M.D.   On: 03/08/2017 20:36   Mr Knee Left Wo Contrast  Result Date: 03/08/2017 CLINICAL DATA:  Left medial knee pain since a twisting injury. EXAM: MRI OF THE LEFT KNEE WITHOUT CONTRAST TECHNIQUE: Multiplanar, multisequence MR imaging of the knee was performed. No intravenous contrast was administered. COMPARISON:  Left knee x-rays dated February 15, 2017. FINDINGS: MENISCI Medial meniscus: There is a horizontal tear of the body and posterior horn. Lateral meniscus: There is a complex tear of the body with vertical and horizontal components. LIGAMENTS Cruciates:  Intact ACL and PCL. Collaterals: Medial collateral ligament is intact. Lateral collateral ligament complex is intact. CARTILAGE Patellofemoral: Mild superficial irregularity over the patellar apex. Medial:  No chondral defect. Lateral:  No chondral defect. Joint: Trace joint effusion. Normal Hoffa's fat. No plical thickening. Popliteal Fossa:  No Baker cyst.  Intact popliteus tendon. Extensor Mechanism:  Intact quadriceps tendon and patellar tendon. Bones: There is a  nondisplaced fracture through the medial distal femoral metaphysis with surrounding marrow and periosteal edema. Other: None. IMPRESSION: 1. Nondisplaced fracture through the medial aspect of the distal femoral metaphysis. 2. Horizontal tear of the medial meniscus body and posterior horn. 3. Complex tear of the lateral meniscus body with vertical and horizontal components. Electronically Signed   By: Titus Dubin M.D.   On: 03/08/2017 21:57   Dg Chest Portable 1 View  Result Date: 03/07/2017 CLINICAL DATA:  Acute mental status change. EXAM: PORTABLE CHEST 1 VIEW COMPARISON:  December 21, 2016 FINDINGS: Cardiomegaly. No overt edema. Atelectasis in the bases. No suspicious infiltrate. IMPRESSION: Cardiomegaly. No acute abnormality. Probable atelectasis in the bases. Electronically Signed   By: Dorise Bullion III M.D   On: 03/07/2017 21:11   Ct Head Code Stroke Wo Contrast  Result Date: 03/07/2017 CLINICAL DATA:  Code stroke.  Altered mental status EXAM: CT HEAD WITHOUT CONTRAST TECHNIQUE: Contiguous axial images were obtained from the base of the skull through the vertex without intravenous contrast. COMPARISON:  Head CT 07/19/2016 FINDINGS: Brain: No mass lesion or acute hemorrhage. No focal hypoattenuation of the basal ganglia or cortex to indicate infarcted tissue. No hydrocephalus or age advanced atrophy. Vascular: No hyperdense vessel. No advanced atherosclerotic calcification of the arteries at the skull base. Skull: Normal visualized skull base, calvarium and extracranial soft tissues. Sinuses/Orbits: No sinus fluid levels or advanced mucosal thickening. No mastoid effusion. Normal orbits. ASPECTS [facility] Stroke Program Early CT Score) - Ganglionic level infarction (caudate, lentiform nuclei, internal capsule, insula, M1-M3 cortex): 7 - Supraganglionic infarction (M4-M6 cortex): 3 Total score (0-10 with 10 being normal): 10 IMPRESSION: 1. Normal head CT. 2. ASPECTS is 10. These results were called by  telephone at the time of interpretation on 03/07/2017 at 8:55 pm to Dr. Arta Silence , who verbally acknowledged these results. Electronically Signed   By: Ulyses Jarred M.D.   On: 03/07/2017 20:56    Positive ROS: All other systems have been reviewed and were otherwise negative  with the exception of those mentioned in the HPI and as above.  Physical Exam: General: Alert, no acute distress  MUSCULOSKELETAL: Left lower extremity:  Patient has a hinged knee brace in place over the left knee. Her skin is intact. There is no significant erythema and mild swelling. Her thigh and leg compartments are soft and compressible. She has palpable pedal pulses, intact sensation light touch in both lower extremity is. She has intact motor function in both lower extremities without detectable weakness to manual strength testing. Patient has tenderness over the medial femoral epicondyle. There is no obvious deformity. She has no calf tenderness or lower leg edema.  Assessment: Medial sided left distal femur fracture, nondisplaced  Plan: I explained to the patient that she has a nondisplaced fracture involving her distal femur of the left thigh. This is the cause behind her medial sided left knee pain. I recommend that we change her from her current hinged left knee brace into a knee immobilizer. She should be nonweightbearing on the left lower extremity for a minimum of 6-8 weeks. She should have a physical therapy consultation tomorrow to assess her safety using a walker with no weightbearing on the left lower extremity. She is deemed safe by physical therapy she may be discharged from an orthopedic standpoint and will follow-up in my office in 2 weeks for reevaluation and x-ray.  Patient understood and agreed this plan.  Patient's MRI also showed that she had meniscal tears. The patient had asked about these. I would not recommend any intervention for the meniscal tears until after the fracture has healed. It  is possible she will asymptomatic in regards to the meniscal tears after the fracture has healed. If she continues to have mechanical symptoms and pain due to the meniscus tears after the fracture is healed arthroscopic partial meniscectomy could be considered. There is no acute management necessary for her meniscal tears at this time.      [place], MD    03/09/2017 6:56 PM

## 2017-03-09 NOTE — Progress Notes (Signed)
PT Cancellation Note  Patient Details Name: Stephanie [name] MRN: [id number] DOB: 06-22-1950   Cancelled Treatment:    Reason Eval/Treat Not Completed: Medical issues which prohibited therapy.  Spoke with Dr. Vianne Bulls regarding L nondisplaced fx of medial aspect of distal femoral metaphysis.  Per Dr. Vianne Bulls hold PT until Ortho consult complete.  Will continue to follow acutely.    Collie Siad PT, DPT 03/09/2017, 10:03 AM

## 2017-03-09 NOTE — Care Management Obs Status (Signed)
[facility] NOTIFICATION   Patient Details  Name: Stephanie [name] MRN: [id number] Date of Birth: 1950-12-31   Medicare Observation Status Notification Given:  Yes    Beverly Sessions, RN 03/09/2017, 3:35 PM

## 2017-03-09 NOTE — Progress Notes (Signed)
[place] [facility] [place] NAME: Stephanie [name]    MR#:  [id number]  DATE OF BIRTH:  11/08/1950  SUBJECTIVE:noted to have meniscal tears in left knee and distal femur fracture.ortho consult pending.  CHIEF COMPLAINT:   Chief Complaint  Patient presents with  . Altered Mental Status    REVIEW OF SYSTEMS:   ROS CONSTITUTIONAL: No fever, fatigue or weakness.  EYES: No blurred or double vision.  EARS, NOSE, AND THROAT: No tinnitus or ear pain.  RESPIRATORY: No cough, shortness of breath, wheezing or hemoptysis.  CARDIOVASCULAR: No chest pain, orthopnea, edema.  GASTROINTESTINAL: No nausea, vomiting, diarrhea or abdominal pain.  GENITOURINARY: No dysuria, hematuria.  ENDOCRINE: No polyuria, nocturia,  HEMATOLOGY: No anemia, easy bruising or bleeding SKIN: No rash or lesion. MUSCULOSKELETAL: left knee pain.  NEUROLOGIC: No tingling, numbness, weakness.  PSYCHIATRY: No anxiety or depression.   DRUG ALLERGIES:   Allergies  Allergen Reactions  . Doxycycline Hives, Swelling and Other (See Comments)    Reaction:  Facial swelling     VITALS:  Blood pressure (!) 104/59, pulse 72, temperature (!) 97.5 F (36.4 C), resp. rate 18, height 5' 3"  (1.6 m), weight 102 kg (224 lb 13.9 oz), SpO2 94 %.  PHYSICAL EXAMINATION:  GENERAL:  66 y.o.-year-old patient lying in the bed with no acute distress.  EYES: Pupils equal, round, reactive to light and accommodation. No scleral icterus. Extraocular muscles intact.  HEENT: Head atraumatic, normocephalic. Oropharynx and nasopharynx clear.  NECK:  Supple, no jugular venous distention. No thyroid enlargement, no tenderness.  LUNGS: Normal breath sounds bilaterally, no wheezing, rales,rhonchi or crepitation. No use of accessory muscles of respiration.  CARDIOVASCULAR: S1, S2 normal. No murmurs, rubs, or gallops.  ABDOMEN: Soft, nontender, nondistended. Bowel sounds present. No organomegaly or mass.   EXTREMITIES: No pedal edema, cyanosis, or clubbing.  NEUROLOGIC: Cranial nerves II through XII are intact. Muscle strength 5/5 in all extremities. Sensation intact. Gait not checked.  PSYCHIATRIC: The patient is alert and oriented x 3.  SKIN: No obvious rash, lesion, or ulcer.    LABORATORY PANEL:   CBC  Recent Labs Lab 03/08/17 0355  WBC 7.1  HGB 12.5  HCT 36.7  PLT 104*   ------------------------------------------------------------------------------------------------------------------  Chemistries   Recent Labs Lab 03/07/17 2020 03/08/17 0355  NA 140 141  K 3.8 3.9  CL 102 104  CO2 29 28  GLUCOSE 100* 140*  BUN 17 15  CREATININE 0.87 0.76  CALCIUM 9.0 8.7*  AST 35  --   ALT 31  --   ALKPHOS 259*  --   BILITOT 1.1  --    ------------------------------------------------------------------------------------------------------------------  Cardiac Enzymes  Recent Labs Lab 03/07/17 2028  TROPONINI <0.03   ------------------------------------------------------------------------------------------------------------------  RADIOLOGY:  Ct Angio Head W Or Wo Contrast  Result Date: 03/07/2017 CLINICAL DATA:  Found unresponsive. Altered mental status. Suspect stroke. History of hyperlipidemia, hypertension, bilateral carotid artery stenosis, diabetes. EXAM: CT ANGIOGRAPHY HEAD AND NECK TECHNIQUE: Multidetector CT imaging of the head and neck was performed using the standard protocol during bolus administration of intravenous contrast. Multiplanar CT image reconstructions and MIPs were obtained to evaluate the vascular anatomy. Carotid stenosis measurements (when applicable) are obtained utilizing NASCET criteria, using the distal internal carotid diameter as the denominator. CONTRAST:  75 cc Isovue 370 COMPARISON:  CT HEAD March 07, 2017 at 2042 hours and carotid ultrasound February 10, 2016 FINDINGS: CTA NECK AORTIC ARCH: Normal appearance of the thoracic arch, normal  branch pattern. Mild calcific atherosclerosis aortic arch. The The origins of the innominate, left Common carotid artery and subclavian artery are widely patent. RIGHT CAROTID SYSTEM: Common carotid artery is widely patent, coursing in a straight line fashion. Mild intimal thickening and calcific atherosclerosis carotid bifurcation without hemodynamically significant stenosis by NASCET criteria. Normal appearance of the internal carotid artery. LEFT CAROTID SYSTEM: Common carotid artery is widely patent, coursing in a straight line fashion. Trace intimal thickening carotid bifurcation without hemodynamically significant stenosis by NASCET criteria. Normal appearance of the internal carotid artery. VERTEBRAL ARTERIES:Codominant vertebral artery's. Normal appearance of the vertebral arteries, which appear widely patent. SKELETON: No acute osseous process though bone windows have not been submitted. C5 through C7 ACDF with arthrodesis. Moderate to severe LEFT C3-4 neural foraminal narrowing. Patient is edentulous. OTHER NECK: Soft tissues of the neck are nonacute though, not tailored for evaluation. UPPER CHEST: Included lung apices are clear. No superior mediastinal lymphadenopathy. Pulmonary vascular congestion. CTA HEAD ANTERIOR CIRCULATION: Patent cervical internal carotid arteries, petrous, cavernous and supra clinoid internal carotid arteries. Widely patent anterior communicating artery. Patent anterior and middle cerebral arteries. No large vessel occlusion, significant stenosis, contrast extravasation or aneurysm. POSTERIOR CIRCULATION: Patent vertebral arteries, vertebrobasilar junction and basilar artery, as well as main branch vessels. Mild calcific atherosclerosis RIGHT V4 segment. Patent posterior cerebral arteries. Small bilateral posterior communicating arteries present. No large vessel occlusion, significant stenosis, contrast extravasation or aneurysm. VENOUS SINUSES: Major dural venous sinuses are  patent though not tailored for evaluation on this angiographic examination. ANATOMIC VARIANTS: None. DELAYED PHASE: Not performed. MIP images reviewed. IMPRESSION: CTA NECK: 1. Atherosclerosis without hemodynamically significant stenosis or acute vascular process. 2. Included chest demonstrates pulmonary vascular congestion. CTA HEAD: 1. No emergent large vessel occlusion or significant stenosis. Electronically Signed   By: Elon Alas M.D.   On: 03/07/2017 22:43   Ct Angio Neck W And/or Wo Contrast  Result Date: 03/07/2017 CLINICAL DATA:  Found unresponsive. Altered mental status. Suspect stroke. History of hyperlipidemia, hypertension, bilateral carotid artery stenosis, diabetes. EXAM: CT ANGIOGRAPHY HEAD AND NECK TECHNIQUE: Multidetector CT imaging of the head and neck was performed using the standard protocol during bolus administration of intravenous contrast. Multiplanar CT image reconstructions and MIPs were obtained to evaluate the vascular anatomy. Carotid stenosis measurements (when applicable) are obtained utilizing NASCET criteria, using the distal internal carotid diameter as the denominator. CONTRAST:  75 cc Isovue 370 COMPARISON:  CT HEAD March 07, 2017 at 2042 hours and carotid ultrasound February 10, 2016 FINDINGS: CTA NECK AORTIC ARCH: Normal appearance of the thoracic arch, normal branch pattern. Mild calcific atherosclerosis aortic arch. The The origins of the innominate, left Common carotid artery and subclavian artery are widely patent. RIGHT CAROTID SYSTEM: Common carotid artery is widely patent, coursing in a straight line fashion. Mild intimal thickening and calcific atherosclerosis carotid bifurcation without hemodynamically significant stenosis by NASCET criteria. Normal appearance of the internal carotid artery. LEFT CAROTID SYSTEM: Common carotid artery is widely patent, coursing in a straight line fashion. Trace intimal thickening carotid bifurcation without hemodynamically  significant stenosis by NASCET criteria. Normal appearance of the internal carotid artery. VERTEBRAL ARTERIES:Codominant vertebral artery's. Normal appearance of the vertebral arteries, which appear widely patent. SKELETON: No acute osseous process though bone windows have not been submitted. C5 through C7 ACDF with arthrodesis. Moderate to severe LEFT C3-4 neural foraminal narrowing. Patient is edentulous. OTHER NECK: Soft tissues of the neck are nonacute though, not tailored for evaluation. UPPER CHEST: Included lung apices  are clear. No superior mediastinal lymphadenopathy. Pulmonary vascular congestion. CTA HEAD ANTERIOR CIRCULATION: Patent cervical internal carotid arteries, petrous, cavernous and supra clinoid internal carotid arteries. Widely patent anterior communicating artery. Patent anterior and middle cerebral arteries. No large vessel occlusion, significant stenosis, contrast extravasation or aneurysm. POSTERIOR CIRCULATION: Patent vertebral arteries, vertebrobasilar junction and basilar artery, as well as main branch vessels. Mild calcific atherosclerosis RIGHT V4 segment. Patent posterior cerebral arteries. Small bilateral posterior communicating arteries present. No large vessel occlusion, significant stenosis, contrast extravasation or aneurysm. VENOUS SINUSES: Major dural venous sinuses are patent though not tailored for evaluation on this angiographic examination. ANATOMIC VARIANTS: None. DELAYED PHASE: Not performed. MIP images reviewed. IMPRESSION: CTA NECK: 1. Atherosclerosis without hemodynamically significant stenosis or acute vascular process. 2. Included chest demonstrates pulmonary vascular congestion. CTA HEAD: 1. No emergent large vessel occlusion or significant stenosis. Electronically Signed   By: Elon Alas M.D.   On: 03/07/2017 22:43   Mr Brain Wo Contrast  Result Date: 03/08/2017 CLINICAL DATA:  Altered mental status EXAM: MRI HEAD WITHOUT CONTRAST TECHNIQUE: Multiplanar,  multiecho pulse sequences of the brain and surrounding structures were obtained without intravenous contrast. COMPARISON:  CTA head 03/07/2017 FINDINGS: Brain: The midline structures are normal. There is no focal diffusion restriction to indicate acute infarct. The brain parenchymal signal is normal and there is no mass lesion. No intraparenchymal hematoma or chronic microhemorrhage. Brain volume is normal for age without lobar predominant atrophy. The dura is normal and there is no extra-axial collection. Vascular: Major intracranial arterial and venous sinus flow voids are preserved. Skull and upper cervical spine: The visualized skull base, calvarium, upper cervical spine and extracranial soft tissues are normal. Sinuses/Orbits: No fluid levels or advanced mucosal thickening. No mastoid or middle ear effusion. Normal orbits. IMPRESSION: Normal MRI of the brain. Electronically Signed   By: Ulyses Jarred M.D.   On: 03/08/2017 20:36   Mr Knee Left Wo Contrast  Result Date: 03/08/2017 CLINICAL DATA:  Left medial knee pain since a twisting injury. EXAM: MRI OF THE LEFT KNEE WITHOUT CONTRAST TECHNIQUE: Multiplanar, multisequence MR imaging of the knee was performed. No intravenous contrast was administered. COMPARISON:  Left knee x-rays dated February 15, 2017. FINDINGS: MENISCI Medial meniscus: There is a horizontal tear of the body and posterior horn. Lateral meniscus: There is a complex tear of the body with vertical and horizontal components. LIGAMENTS Cruciates:  Intact ACL and PCL. Collaterals: Medial collateral ligament is intact. Lateral collateral ligament complex is intact. CARTILAGE Patellofemoral: Mild superficial irregularity over the patellar apex. Medial:  No chondral defect. Lateral:  No chondral defect. Joint: Trace joint effusion. Normal Hoffa's fat. No plical thickening. Popliteal Fossa:  No Baker cyst.  Intact popliteus tendon. Extensor Mechanism:  Intact quadriceps tendon and patellar tendon.  Bones: There is a nondisplaced fracture through the medial distal femoral metaphysis with surrounding marrow and periosteal edema. Other: None. IMPRESSION: 1. Nondisplaced fracture through the medial aspect of the distal femoral metaphysis. 2. Horizontal tear of the medial meniscus body and posterior horn. 3. Complex tear of the lateral meniscus body with vertical and horizontal components. Electronically Signed   By: Titus Dubin M.D.   On: 03/08/2017 21:57   Dg Chest Portable 1 View  Result Date: 03/07/2017 CLINICAL DATA:  Acute mental status change. EXAM: PORTABLE CHEST 1 VIEW COMPARISON:  December 21, 2016 FINDINGS: Cardiomegaly. No overt edema. Atelectasis in the bases. No suspicious infiltrate. IMPRESSION: Cardiomegaly. No acute abnormality. Probable atelectasis in the bases. Electronically Signed  By: Dorise Bullion III M.D   On: 03/07/2017 21:11   Ct Head Code Stroke Wo Contrast  Result Date: 03/07/2017 CLINICAL DATA:  Code stroke.  Altered mental status EXAM: CT HEAD WITHOUT CONTRAST TECHNIQUE: Contiguous axial images were obtained from the base of the skull through the vertex without intravenous contrast. COMPARISON:  Head CT 07/19/2016 FINDINGS: Brain: No mass lesion or acute hemorrhage. No focal hypoattenuation of the basal ganglia or cortex to indicate infarcted tissue. No hydrocephalus or age advanced atrophy. Vascular: No hyperdense vessel. No advanced atherosclerotic calcification of the arteries at the skull base. Skull: Normal visualized skull base, calvarium and extracranial soft tissues. Sinuses/Orbits: No sinus fluid levels or advanced mucosal thickening. No mastoid effusion. Normal orbits. ASPECTS [facility] Stroke Program Early CT Score) - Ganglionic level infarction (caudate, lentiform nuclei, internal capsule, insula, M1-M3 cortex): 7 - Supraganglionic infarction (M4-M6 cortex): 3 Total score (0-10 with 10 being normal): 10 IMPRESSION: 1. Normal head CT. 2. ASPECTS is 10. These  results were called by telephone at the time of interpretation on 03/07/2017 at 8:55 pm to Dr. Arta Silence , who verbally acknowledged these results. Electronically Signed   By: Ulyses Jarred M.D.   On: 03/07/2017 20:56    EKG:   Orders placed or performed during the hospital encounter of 03/07/17  . EKG 12-Lead  . EKG 12-Lead  . ED EKG  . ED EKG   *Note: Due to a large number of results and/or encounters for the requested time period, some results have not been displayed. A complete set of results can be found in Results Review.    ASSESSMENT AND PLAN:  Left distal femur fracture and meniscal tears ;due to fall 3 weeks ago;not got out out if bed since then, Ortho consult,pain and DVT management  2.non alcoholic liver crrhosis  3.depression;restart her meds today,  All the records are reviewed and case discussed with Care Management/Social Workerr. Management plans discussed with the patient, family and they are in agreement.  CODE STATUS: full  TOTAL TIME TAKING CARE OF THIS PATIENT: 35 minutes.   POSSIBLE D/C IN 1-2 DAYS, DEPENDING ON CLINICAL CONDITION.   Epifanio Lesches M.D on 03/09/2017 at 2:51 PM  Between 7am to 6pm - Pager - [phone]  After 6pm go to [email or web address] - password [email or web address] [facility]  Office  [phone]  CC: Primary care physician; Sarajane Jews, MD   Note: This dictation was prepared with Dragon dictation along with smaller phrase technology. Any transcriptional errors that result from this process are unintentional.

## 2017-03-09 NOTE — Progress Notes (Signed)
Chaplain received an order to visit with pt in room 224. Pt was experiencing grief as a result of losing 2 sons. Pt was very emotional. Pt had a strong church community that rallies around her. Her pastor visited with her yesterday. Chaplain provided the ministry of prayer as well as emotional and grief support.     03/09/17 1110  Clinical Encounter Type  Visited With Patient  Visit Type Initial;Spiritual support  Referral From Nurse  Consult/Referral To Chaplain  Spiritual Encounters  Spiritual Needs Prayer;Emotional;Grief support

## 2017-03-10 DIAGNOSIS — R4182 Altered mental status, unspecified: Secondary | ICD-10-CM | POA: Diagnosis not present

## 2017-03-10 LAB — GLUCOSE, CAPILLARY
GLUCOSE-CAPILLARY: 116 mg/dL — AB (ref 65–99)
GLUCOSE-CAPILLARY: 144 mg/dL — AB (ref 65–99)
GLUCOSE-CAPILLARY: 134 mg/dL — AB (ref 65–99)
Glucose-Capillary: 155 mg/dL — ABNORMAL HIGH (ref 65–99)

## 2017-03-10 MED ORDER — DICYCLOMINE HCL 20 MG PO TABS
10.0000 mg | ORAL_TABLET | Freq: Three times a day (TID) | ORAL | Status: DC
  Administered 2017-03-10 – 2017-03-11 (×3): 10 mg via ORAL
  Filled 2017-03-10 (×5): qty 1

## 2017-03-10 MED ORDER — ALUM & MAG HYDROXIDE-SIMETH 200-200-20 MG/5ML PO SUSP
15.0000 mL | ORAL | Status: DC | PRN
  Administered 2017-03-10: 15 mL via ORAL
  Filled 2017-03-10: qty 30

## 2017-03-10 NOTE — Evaluation (Signed)
Physical Therapy Evaluation Patient Details Name: Stephanie [name] MRN: [id number] DOB: Sep 07, 1950 Today's Date: 03/10/2017   History of Present Illness  presented to ER secondary to AMS, decreased responsiveness; admitted with acute metabolic encephalopathy realted to polypharmacy.  Patient with reports of L knee pain; noted (per MRI) with non-displaced fracture to medial aspect of distal femur metaphysis.  Clinical Impression  Upon evaluation, patient alert and oriented; follows all commands and demonstrates good effort with all therapeutic tasks.  Generally fearful/anxious with mobility, but responds well to verbal encouragement from therapist.  L LE placed in [place], tolerated well; reviewed wearing schedule, use and associated L LE WBing precautions. Patient voiced understanding.  Currently requiring min/mod assist for bed mobility; min/mod assist for sit/stand and static stance with RW.  Fatigues quickly and requires seated rest break after 5-10 seconds each trial, unable to advance to sliding/stepping R LE; however, demonstrates good ability to maintain NWB L LE with cuing from therapist.  Attempted squat pivot to simulate baseline performance; patient too fearful to attempt this date.  May be appropriate for trial of scoot pivot (over level surfaces) next date to maximize safety/indep with functional transfers. Given difficulty with mobility and newly-noted WBing restrictions, patient unable to negotiate home environment; unsafe to return to ALF. Would benefit from skilled PT to address above deficits and promote optimal return to PLOF; recommend transition to STR upon discharge from acute hospitalization.     Follow Up Recommendations SNF    Equipment Recommendations  Rolling walker with 5" wheels    Recommendations for Other Services       Precautions / Restrictions Precautions Precautions: Fall Required Braces or Orthoses: Knee Immobilizer - Left Knee Immobilizer - Left: On at all  times Restrictions Weight Bearing Restrictions: Yes LLE Weight Bearing: Non weight bearing      Mobility  Bed Mobility Overal bed mobility: Needs Assistance Bed Mobility: Supine to Sit     Supine to sit: Min assist;Mod assist     General bed mobility comments: assist for truncal elevation  Transfers Overall transfer level: Needs assistance Equipment used: Rolling walker (2 wheeled) Transfers: Sit to/from Stand Sit to Stand: Min assist;Mod assist         General transfer comment: cuing for hand placement, adherence to NWB L LE.  Maintains only 5-10 seconds each trial; limited by fatigue, pain in R knee  Ambulation/Gait             General Gait Details: unsafe/unable  Stairs            Wheelchair Mobility    Modified Rankin (Stroke Patients Only)       Balance Overall balance assessment: Needs assistance Sitting-balance support: No upper extremity supported;Feet supported Sitting balance-Leahy Scale: Good     Standing balance support: Bilateral upper extremity supported Standing balance-Leahy Scale: Fair                               Pertinent Vitals/Pain Pain Assessment: Faces Faces Pain Scale: Hurts little more Pain Location: R knee Pain Descriptors / Indicators: Aching;Grimacing;Guarding Pain Intervention(s): Limited activity within patient's tolerance;Monitored during session;Repositioned    Home Living Family/patient expects to be discharged to:: Assisted living               Home Equipment: Walker - 2 wheels;Wheelchair - manual      Prior Function Level of Independence: Independent with assistive device(s)  Comments: At baseline, mod indep with household mobility and ambulation to/from dining hall with RW; assist from staff for ADLs as needed.  In recent 2-3 weeks, has participated wtih minimal gait efforts due to bilat knee pain, utilizing manual WC as primary mobility (with SPT to/from)     Hand  Dominance        Extremity/Trunk Assessment   Upper Extremity Assessment Upper Extremity Assessment: Overall WFL for tasks assessed    Lower Extremity Assessment Lower Extremity Assessment:  (R LE grossly 4-/5; L hip and ankle 4-/5, knee immobilized.  Able to complete bilat SLR without difficulty)       Communication   Communication: No difficulties  Cognition Arousal/Alertness: Awake/alert Behavior During Therapy: WFL for tasks assessed/performed;Anxious Overall Cognitive Status: Within Functional Limits for tasks assessed                                        General Comments      Exercises Other Exercises Other Exercises: Sit/stand x3 with RW, min/mod assist for lift off, standing balance; constant cuing for hand placement, NWB L LE.  Fatiques quickly; notably anxious with efforts. Unable to attempt sliding/stepping with R LE   Assessment/Plan    PT Assessment Patient needs continued PT services  PT Problem List Decreased strength;Decreased range of motion;Decreased activity tolerance;Decreased balance;Decreased mobility;Decreased coordination;Decreased cognition;Decreased knowledge of use of DME;Decreased safety awareness;Decreased knowledge of precautions;Cardiopulmonary status limiting activity;Decreased skin integrity;Pain;Obesity       PT Treatment Interventions DME instruction;Gait training;Functional mobility training;Therapeutic activities;Therapeutic exercise;Balance training;Patient/family education    PT Goals (Current goals can be found in the Care Plan section)  Acute Rehab PT Goals Patient Stated Goal: to learn to move around like this PT Goal Formulation: With patient Time For Goal Achievement: 03/24/17 Potential to Achieve Goals: Good    Frequency 7X/week   Barriers to discharge Decreased caregiver support      Co-evaluation               AM-PAC PT "6 Clicks" Daily Activity  Outcome Measure Difficulty turning over in bed  (including adjusting bedclothes, sheets and blankets)?: Unable Difficulty moving from lying on back to sitting on the side of the bed? : Unable Difficulty sitting down on and standing up from a chair with arms (e.g., wheelchair, bedside commode, etc,.)?: Unable Help needed moving to and from a bed to chair (including a wheelchair)?: A Lot Help needed walking in hospital room?: Total Help needed climbing 3-5 steps with a railing? : Total 6 Click Score: 7    End of Session Equipment Utilized During Treatment: Gait belt Activity Tolerance: Patient tolerated treatment well Patient left: with call bell/phone within reach;in bed (patient refusing bed alarm) Nurse Communication: Mobility status PT Visit Diagnosis: Difficulty in walking, not elsewhere classified (R26.2);Muscle weakness (generalized) (M62.81);Pain Pain - Right/Left: Right Pain - part of body: Knee    Time: 2229-7989 PT Time Calculation (min) (ACUTE ONLY): 24 min   Charges:   PT Evaluation $PT Eval Moderate Complexity: 1 Mod PT Treatments $Therapeutic Activity: 8-22 mins   PT G Codes:   PT G-Codes **NOT FOR INPATIENT CLASS** Functional Assessment Tool Used: AM-PAC 6 Clicks Basic Mobility Functional Limitation: Mobility: Walking and moving around Mobility: Walking and Moving Around Current Status (Q1194): At least 40 percent but less than 60 percent impaired, limited or restricted Mobility: Walking and Moving Around Goal Status [phone]): At  least 1 percent but less than 20 percent impaired, limited or restricted    Analeya Luallen H. Owens Shark, PT, DPT, NCS 03/10/17, 3:55 PM [phone]

## 2017-03-10 NOTE — Progress Notes (Signed)
[facility] [place] NAME: Stephanie [name]    MR#:  [id number]  DATE OF BIRTH:  [birth year]  Patient is seen at bedside, waiting for physical therapy to see her again. Admitted for confusion due to polypharmacy and also left knee fracture and meniscal tears. Seen by orthopedic, recommended nonweightbearing to left leg for 6 weeks, physical therapy evaluation is appreciated.   CHIEF COMPLAINT:   Chief Complaint  Patient presents with  . Altered Mental Status    REVIEW OF SYSTEMS:   ROS CONSTITUTIONAL: No fever, fatigue or weakness.  EYES: No blurred or double vision.  EARS, NOSE, AND THROAT: No tinnitus or ear pain.  RESPIRATORY: No cough, shortness of breath, wheezing or hemoptysis.  CARDIOVASCULAR: No chest pain, orthopnea, edema.  GASTROINTESTINAL: No nausea, vomiting, diarrhea or abdominal pain.  GENITOURINARY: No dysuria, hematuria.  ENDOCRINE: No polyuria, nocturia,  HEMATOLOGY: No anemia, easy bruising or bleeding SKIN: No rash or lesion. MUSCULOSKELETAL: left knee pain.  NEUROLOGIC: No tingling, numbness, weakness.  PSYCHIATRY: No anxiety or depression.   DRUG ALLERGIES:   Allergies  Allergen Reactions  . Doxycycline Hives, Swelling and Other (See Comments)    Reaction:  Facial swelling     VITALS:  Blood pressure 105/64, pulse 70, temperature (!) 97.5 F (36.4 C), temperature source Oral, resp. rate 18, height 5' 3"  (1.6 m), weight 102 kg (224 lb 13.9 oz), SpO2 93 %.  PHYSICAL EXAMINATION:  GENERAL:  66 y.o.-year-old patient lying in the bed with no acute distress.  EYES: Pupils equal, round, reactive to light and accommodation. No scleral icterus. Extraocular muscles intact.  HEENT: Head atraumatic, normocephalic. Oropharynx and nasopharynx clear.  NECK:  Supple, no jugular venous distention. No thyroid enlargement, no tenderness.  LUNGS: Normal breath sounds bilaterally, no wheezing, rales,rhonchi or  crepitation. No use of accessory muscles of respiration.  CARDIOVASCULAR: S1, S2 normal. No murmurs, rubs, or gallops.  ABDOMEN: Soft, nontender, nondistended. Bowel sounds present. No organomegaly or mass.  EXTREMITIES: No pedal edema, cyanosis, or clubbing.Left knee painful, range of motion is limited.  NEUROLOGIC: Cranial nerves II through XII are intact. Muscle strength 5/5 in all extremities. Sensation intact. Gait not checked.  PSYCHIATRIC: The patient is alert and oriented x 3.  SKIN: No obvious rash, lesion, or ulcer.    LABORATORY PANEL:   CBC  Recent Labs Lab 03/08/17 0355  WBC 7.1  HGB 12.5  HCT 36.7  PLT 104*   ------------------------------------------------------------------------------------------------------------------  Chemistries   Recent Labs Lab 03/07/17 2020 03/08/17 0355  NA 140 141  K 3.8 3.9  CL 102 104  CO2 29 28  GLUCOSE 100* 140*  BUN 17 15  CREATININE 0.87 0.76  CALCIUM 9.0 8.7*  AST 35  --   ALT 31  --   ALKPHOS 259*  --   BILITOT 1.1  --    ------------------------------------------------------------------------------------------------------------------  Cardiac Enzymes  Recent Labs Lab 03/07/17 2028  TROPONINI <0.03   ------------------------------------------------------------------------------------------------------------------  RADIOLOGY:  Mr Brain Wo Contrast  Result Date: 03/08/2017 CLINICAL DATA:  Altered mental status EXAM: MRI HEAD WITHOUT CONTRAST TECHNIQUE: Multiplanar, multiecho pulse sequences of the brain and surrounding structures were obtained without intravenous contrast. COMPARISON:  CTA head 03/07/2017 FINDINGS: Brain: The midline structures are normal. There is no focal diffusion restriction to indicate acute infarct. The brain parenchymal signal is normal and there is no mass lesion. No intraparenchymal hematoma or chronic microhemorrhage. Brain volume is normal for age without lobar predominant atrophy.  The  dura is normal and there is no extra-axial collection. Vascular: Major intracranial arterial and venous sinus flow voids are preserved. Skull and upper cervical spine: The visualized skull base, calvarium, upper cervical spine and extracranial soft tissues are normal. Sinuses/Orbits: No fluid levels or advanced mucosal thickening. No mastoid or middle ear effusion. Normal orbits. IMPRESSION: Normal MRI of the brain. Electronically Signed   By: Ulyses Jarred M.D.   On: 03/08/2017 20:36   Mr Knee Left Wo Contrast  Result Date: 03/08/2017 CLINICAL DATA:  Left medial knee pain since a twisting injury. EXAM: MRI OF THE LEFT KNEE WITHOUT CONTRAST TECHNIQUE: Multiplanar, multisequence MR imaging of the knee was performed. No intravenous contrast was administered. COMPARISON:  Left knee x-rays dated February 15, 2017. FINDINGS: MENISCI Medial meniscus: There is a horizontal tear of the body and posterior horn. Lateral meniscus: There is a complex tear of the body with vertical and horizontal components. LIGAMENTS Cruciates:  Intact ACL and PCL. Collaterals: Medial collateral ligament is intact. Lateral collateral ligament complex is intact. CARTILAGE Patellofemoral: Mild superficial irregularity over the patellar apex. Medial:  No chondral defect. Lateral:  No chondral defect. Joint: Trace joint effusion. Normal Hoffa's fat. No plical thickening. Popliteal Fossa:  No Baker cyst.  Intact popliteus tendon. Extensor Mechanism:  Intact quadriceps tendon and patellar tendon. Bones: There is a nondisplaced fracture through the medial distal femoral metaphysis with surrounding marrow and periosteal edema. Other: None. IMPRESSION: 1. Nondisplaced fracture through the medial aspect of the distal femoral metaphysis. 2. Horizontal tear of the medial meniscus body and posterior horn. 3. Complex tear of the lateral meniscus body with vertical and horizontal components. Electronically Signed   By: Titus Dubin M.D.   On:  03/08/2017 21:57    EKG:   Orders placed or performed during the hospital encounter of 03/07/17  . EKG 12-Lead  . EKG 12-Lead  . ED EKG  . ED EKG   *Note: Due to a large number of results and/or encounters for the requested time period, some results have not been displayed. A complete set of results can be found in Results Review.    ASSESSMENT AND PLAN:  Left distal femur fracture and meniscal tears ;due to fall 3 weeks ago;not got out out if bed since then, Seen by orthopedic doctor Dr. Mack Guise, recommended no surgical intervention at this time. Patient to be nonweightbearing to left leg for a minimum 6-8 weeks. Physical therapy consult to assess for safety and further disposition. Patient has meniscal tears in the left knee but fracture needs to be  Healed  before arthroscopic partial meniscectomy could be considered.   2.non alcoholic liver cirrhosis: Follows up with gastroenterology as an outpatient.  3.depression;-anxiety: We started her on her home medications yesterday. Patient came for confusion due to polypharmacy, now she is alert, awake, oriented. CT head did not reveal any acute strokes, MRI of the brain also negative for stroke. Restarted her depression, anxiety medications yesterday.  All the records are reviewed and case discussed with Care Management/Social Workerr. Management plans discussed with the patient, family and they are in agreement.  CODE STATUS: full  TOTAL TIME TAKING CARE OF THIS PATIENT: 35 minutes.   POSSIBLE D/C IN 1-2 DAYS, DEPENDING ON CLINICAL CONDITION.   Epifanio Lesches M.D on 03/10/2017 at 1:51 PM  Between 7am to 6pm - Pager - [phone]  After 6pm go to [email or web address] - password [email or web address]  [facility]  Office  [phone]  CC: Primary care physician;  Sarajane Jews, MD   Note: This dictation was prepared with Dragon dictation along with smaller phrase technology. Any transcriptional errors that result from this  process are unintentional.

## 2017-03-10 NOTE — NC FL2 (Signed)
[place] LEVEL OF CARE SCREENING TOOL     IDENTIFICATION  Patient Name: Stephanie [name] Birthdate: 10/07/1950 Sex: female Admission Date (Current Location): 03/07/2017  [place] and [place] Number:  Engineering geologist and Address:  [facility], [address], [place], [place] [address]      Provider Number: [id number]  Attending Physician Name and Address:  Epifanio Lesches, MD  Relative Name and Phone Number:       Current Level of Care: Hospital Recommended Level of Care: [place] Prior Approval Number:    Date Approved/Denied:   PASRR Number:    Discharge Plan: SNF    Current Diagnoses: Patient Active Problem List   Diagnosis Date Noted  . Altered mental status 03/07/2017  . Degenerative tear of lateral meniscus of right knee 11/15/2016  . Encephalopathy 07/19/2016  . Degenerative tear of medial meniscus of right knee 07/01/2016  . Primary osteoarthritis of right knee 07/01/2016  . Diabetes mellitus type 1, uncontrolled, without complications ([place]) 93/23/5573  . Lymphedema 06/14/2016  . Pain in limb 05/06/2016  . Swelling of limb 05/06/2016  . Postphlebitic syndrome with inflammation 05/06/2016  . Pneumonia 04/18/2016  . Chest pain 04/04/2016  . Iron deficiency anemia 01/30/2016  . Bilateral carotid artery stenosis 01/12/2016  . Hypoglycemia 09/09/2015  . Hyperglycemia 07/16/2015  . Neuropathy 06/08/2015  . Thrombocytopenia ([place]) 08/19/2014  . Abdominal pain, chronic, epigastric 06/12/2014  . Liver cirrhosis secondary to NASH ([place]) 02/27/2014  . Dizziness and giddiness 10/30/2013  . DOE (dyspnea on exertion) 10/30/2013  . Poorly controlled type 2 diabetes mellitus with complication ([place]) 22/07/5425  . DNR (do not resuscitate) 07/29/2013  . Encounter for routine gynecological examination 07/29/2013  . Dyspnea 07/10/2013  . Lung nodule 07/10/2013  . Varicose veins of left lower extremity  10/09/2012  . GERD (gastroesophageal reflux disease) 10/09/2012  . Cirrhosis of liver ([place]) 02/02/2012  . Anxiety 07/11/2011  . Aortic dissection ([place])   . BACK PAIN, LUMBAR, CHRONIC 11/19/2009  . UNS ADVRS EFF OTH RX MEDICINAL&BIOLOGICAL SBSTNC 12/16/2008  . UNSPECIFIED VITAMIN D DEFICIENCY 09/26/2008  . TOBACCO ABUSE 06/19/2008  . RESTLESS LEGS SYNDROME 04/27/2007  . INSOMNIA 04/27/2007  . HLD (hyperlipidemia) 03/08/2007  . OSA (obstructive sleep apnea) 03/08/2007  . Essential hypertension 03/08/2007  . Asthma 03/08/2007  . BIPOLAR AFFECTIVE DISORDER, HX OF 03/08/2007    Orientation RESPIRATION BLADDER Height & Weight     Self, Time, Situation, Place  Normal Continent Weight: 224 lb 13.9 oz (102 kg) Height:  5' 3"  (160 cm)  BEHAVIORAL SYMPTOMS/MOOD NEUROLOGICAL BOWEL NUTRITION STATUS   (none)  (none) Continent Diet (carb modified)  AMBULATORY STATUS COMMUNICATION OF NEEDS Skin   Total Care Verbally Normal                       Personal Care Assistance Level of Assistance  Bathing, Feeding, Dressing Bathing Assistance: Maximum assistance Feeding assistance: Limited assistance Dressing Assistance: Maximum assistance     Functional Limitations Info   (none)          SPECIAL CARE FACTORS FREQUENCY  PT (By licensed PT)                    Contractures Contractures Info: Not present    Additional Factors Info  Isolation Precautions, Code Status, Allergies Code Status Info: full Allergies Info: doxycycline     Isolation Precautions Info: esbl,mrsa     Current Medications (03/10/2017):  This is the current hospital active medication list Current Facility-Administered Medications  Medication Dose Route Frequency Provider Last Rate Last Dose  . 0.9 %  sodium chloride infusion  250 mL Intravenous PRN Demetrios Loll, MD      . acetaminophen (TYLENOL) tablet 650 mg  650 mg Oral Q6H PRN Demetrios Loll, MD   650 mg at 03/09/17 1246   Or  . acetaminophen (TYLENOL)  suppository 650 mg  650 mg Rectal Q6H PRN Demetrios Loll, MD      . albuterol (PROVENTIL) (2.5 MG/3ML) 0.083% nebulizer solution 2.5 mg  2.5 mg Nebulization Q2H PRN Demetrios Loll, MD      . ALPRAZolam Duanne Moron) tablet 0.25 mg  0.25 mg Oral TID PRN Demetrios Loll, MD   0.25 mg at 03/09/17 2327  . alum & mag hydroxide-simeth (MAALOX/MYLANTA) 200-200-20 MG/5ML suspension 15 mL  15 mL Oral Q4H PRN Epifanio Lesches, MD   15 mL at 03/10/17 1148  . aspirin tablet 325 mg  325 mg Oral Daily Demetrios Loll, MD   325 mg at 03/10/17 1020  . atorvastatin (LIPITOR) tablet 40 mg  40 mg Oral q1800 Demetrios Loll, MD   40 mg at 03/09/17 1736  . bisacodyl (DULCOLAX) EC tablet 5 mg  5 mg Oral Daily PRN Demetrios Loll, MD      . buPROPion [facility] SR) 12 hr tablet 150 mg  150 mg Oral Daily Demetrios Loll, MD   150 mg at 03/10/17 1020  . dicyclomine (BENTYL) capsule 10 mg  10 mg Oral TID Demetrios Loll, MD   10 mg at 03/10/17 1017  . diphenhydrAMINE (BENADRYL) capsule 25 mg  25 mg Oral Q6H PRN Demetrios Loll, MD      . enoxaparin (LOVENOX) injection 40 mg  40 mg Subcutaneous Q24H Demetrios Loll, MD   40 mg at 03/09/17 2155  . ferrous sulfate tablet 325 mg  325 mg Oral BID Demetrios Loll, MD   325 mg at 03/10/17 1018  . furosemide (LASIX) tablet 80 mg  80 mg Oral Daily Demetrios Loll, MD   80 mg at 03/10/17 1017  . guaifenesin (ROBITUSSIN) 100 MG/5ML syrup 300 mg  300 mg Oral Q6H PRN Demetrios Loll, MD      . HYDROcodone-acetaminophen (NORCO/VICODIN) 5-325 MG per tablet 1-2 tablet  1-2 tablet Oral Q4H PRN Demetrios Loll, MD   1 tablet at 03/10/17 0308  . insulin aspart (novoLOG) injection 0-5 Units  0-5 Units Subcutaneous QHS Demetrios Loll, MD      . insulin aspart (novoLOG) injection 0-9 Units  0-9 Units Subcutaneous TID WC Demetrios Loll, MD   2 Units at 03/10/17 1301  . insulin glargine (LANTUS) injection 28 Units  28 Units Subcutaneous QHS Demetrios Loll, MD   28 Units at 03/09/17 2156  . lactulose (CHRONULAC) 10 GM/15ML solution 10 g  10 g Oral Once per day on Mon Wed Fri  Chen, Qing, MD   10 g at 03/10/17 1029  . lamoTRIgine (LAMICTAL) tablet 100 mg  100 mg Oral BID Demetrios Loll, MD   100 mg at 03/10/17 1017  . loperamide (IMODIUM) capsule 4 mg  4 mg Oral PRN Demetrios Loll, MD      . magnesium hydroxide (MILK OF MAGNESIA) suspension 30 mL  30 mL Oral Daily PRN Demetrios Loll, MD      . metoCLOPramide (REGLAN) tablet 5 mg  5 mg Oral TID AC & HS Demetrios Loll, MD   5 mg at 03/10/17 1301  . mometasone-formoterol (DULERA) 200-5  MCG/ACT inhaler 2 puff  2 puff Inhalation BID Demetrios Loll, MD   2 puff at 03/10/17 1018  . ondansetron (ZOFRAN) tablet 4 mg  4 mg Oral Q6H PRN Demetrios Loll, MD       Or  . ondansetron [facility]) injection 4 mg  4 mg Intravenous Q6H PRN Demetrios Loll, MD   4 mg at 03/09/17 2330  . oxybutynin (DITROPAN-XL) 24 hr tablet 5 mg  5 mg Oral Daily Demetrios Loll, MD   5 mg at 03/10/17 1017  . oxyCODONE (Oxy IR/ROXICODONE) immediate release tablet 5 mg  5 mg Oral Q4H PRN Demetrios Loll, MD   5 mg at 03/09/17 1246  . pantoprazole (PROTONIX) EC tablet 40 mg  40 mg Oral Daily Demetrios Loll, MD   40 mg at 03/10/17 1018  . potassium chloride SA (K-DUR,KLOR-CON) CR tablet 20 mEq  20 mEq Oral Daily Demetrios Loll, MD   20 mEq at 03/10/17 1018  . senna-docusate (Senokot-S) tablet 1 tablet  1 tablet Oral QHS PRN Demetrios Loll, MD      . sodium chloride flush (NS) 0.9 % injection 3 mL  3 mL Intravenous Q12H Demetrios Loll, MD   3 mL at 03/10/17 1020  . sodium chloride flush (NS) 0.9 % injection 3 mL  3 mL Intravenous PRN Demetrios Loll, MD      . spironolactone (ALDACTONE) tablet 25 mg  25 mg Oral Daily Demetrios Loll, MD   25 mg at 03/10/17 1017     Discharge Medications: Please see discharge summary for a list of discharge medications.  Relevant Imaging Results:  Relevant Lab Results:   Additional Information SS: [id number]  Shela Leff, LCSW

## 2017-03-10 NOTE — Progress Notes (Signed)
PT Cancellation Note  Patient Details Name: Stephanie [name] MRN: [id number] DOB: [birth year]   Cancelled Treatment:    Reason Eval/Treat Not Completed: Other (comment) (Consult received and chart reviewed.  Upon arrival to room, patient eating lunch.  Will return for evaluation at later time this PM.)   Mahati Vajda H. Owens Shark, PT, DPT, NCS 03/10/17, 12:16 PM [phone]

## 2017-03-10 NOTE — Clinical Social Work Note (Signed)
Clinical Social Work Assessment  Patient Details  Name: Stephanie [name] MRN: [id number] Date of Birth: 1951-01-28  Date of referral:  03/10/17               Reason for consult:  Discharge Planning                Permission sought to share information with:  Facility Art therapist granted to share information::  Yes, Verbal Permission Granted  Name::        Agency::     Relationship::     Contact Information:     Housing/Transportation Living arrangements for the past 2 months:  [place] of Information:  Patient Patient Interpreter Needed:  None Criminal Activity/Legal Involvement Pertinent to Current Situation/Hospitalization:  No - Comment as needed Significant Relationships:  Friend Lives with:  Facility Resident Do you feel safe going back to the place where you live?  Yes Need for family participation in patient care:  No (Coment)  Care giving concerns:  Patient resides at [facility].   Social Worker assessment / plan:  CSW informed by PT that they are recommending STR. CSW spoke with patient and she is aware of PT recommending STR and she is in agreement. She prefers [facility]. CSW explained the process of a bed search and will follow up with her once bed offers are received. Patient began to get tearful at end of discussion and stated she has been so worried about insurance coverage and appreciates me coming by to explained that her rehab will be billed to her insurance.  Employment status:  Disabled (Comment on whether or not currently receiving Disability) Insurance information:  Managed Medicare PT Recommendations:  [place] / Referral to community resources:     Patient/Family's Response to care:  Patient expressed appreciation for CSW assistance.  Patient/Family's Understanding of and Emotional Response to Diagnosis, Current Treatment, and Prognosis:  Patient is emotional today and is hopeful  she will be able to get into [facility] for rehab. CSW provided supportive listening.  Emotional Assessment Appearance:    Attitude/Demeanor/Rapport:   (pleasant and cooperative) Affect (typically observed):  Accepting, Adaptable, Calm, Tearful/Crying Orientation:  Oriented to Self, Oriented to Place, Oriented to  Time, Oriented to Situation Alcohol / Substance use:  Not Applicable Psych involvement (Current and /or in the community):  No (Comment)  Discharge Needs  Concerns to be addressed:  Care Coordination Readmission within the last 30 days:  No Current discharge risk:  None Barriers to Discharge:  No Barriers Identified   Shela Leff, LCSW 03/10/2017, 2:32 PM

## 2017-03-11 DIAGNOSIS — R4182 Altered mental status, unspecified: Secondary | ICD-10-CM | POA: Diagnosis not present

## 2017-03-11 LAB — GLUCOSE, CAPILLARY
GLUCOSE-CAPILLARY: 134 mg/dL — AB (ref 65–99)
Glucose-Capillary: 212 mg/dL — ABNORMAL HIGH (ref 65–99)
Glucose-Capillary: 136 mg/dL — ABNORMAL HIGH (ref 65–99)

## 2017-03-11 MED ORDER — OXYCODONE HCL 5 MG PO TABS: 5.0000 mg | ORAL_TABLET | ORAL | 0 refills | Status: DC | PRN

## 2017-03-11 MED ORDER — ALPRAZOLAM 0.25 MG PO TABS: 0.2500 mg | ORAL_TABLET | Freq: Three times a day (TID) | ORAL | 0 refills | Status: DC | PRN

## 2017-03-11 NOTE — Clinical Social Work Note (Signed)
CSW presented bed offers to the patient. The patient has chosen [facility]. The admissions coordinator has approved the patient to discharge today to the facility. The attending MD and RN are aware and will address discharge orders and summary when able. The patient has requested non-emergent EMS for transport. CSW will deliver packet once appropriate documentation has been received by the facility.  Santiago Bumpers, MSW, Latanya Presser [phone]

## 2017-03-11 NOTE — Progress Notes (Signed)
Report called to heather at [facility]. I/s that knee immobilizer should stay in placer and pt is NWB. UNDERSTANDING VOPICED. AWAITING EMMS TRANSPORT

## 2017-03-11 NOTE — Progress Notes (Signed)
Physical Therapy Treatment Patient Details Name: Stephanie [name] MRN: [id number] DOB: 1951/05/13 Today's Date: 03/11/2017    History of Present Illness presented to ER secondary to AMS, decreased responsiveness; admitted with acute metabolic encephalopathy realted to polypharmacy.  Patient with reports of L knee pain; noted (per MRI) with non-displaced fracture to medial aspect of distal femur metaphysis.    PT Comments    Participated in exercises as described below.  Bed mobility with min a x 1 to raise trunk.  Transferred to commode to recliner with min/mod a +2.  Pt did well with 1st transfer maintaining NWB LLE but had some increased difficulty on second transfer when her anxiety increased "I'm not a small person to lift".  Educated pt that she was doing well and we were assisting only for balance and to facilitate turning.  Remained up in chair after session.    Follow Up Recommendations  SNF     Equipment Recommendations       Recommendations for Other Services       Precautions / Restrictions Precautions Precautions: Fall Required Braces or Orthoses: Knee Immobilizer - Left Knee Immobilizer - Left: On at all times Restrictions Weight Bearing Restrictions: Yes LLE Weight Bearing: Non weight bearing    Mobility  Bed Mobility Overal bed mobility: Needs Assistance Bed Mobility: Rolling;Sidelying to Sit Rolling: Min assist Sidelying to sit: Min assist       General bed mobility comments: assist for truncal elevation  Transfers Overall transfer level: Needs assistance Equipment used: 2 person hand held assist Transfers: Stand Pivot Transfers Sit to Stand: Min assist;Mod assist;+2 physical assistance            Ambulation/Gait             General Gait Details: unsafe/unable   Stairs            Wheelchair Mobility    Modified Rankin (Stroke Patients Only)       Balance Overall balance assessment: Needs assistance Sitting-balance  support: No upper extremity supported;Feet supported Sitting balance-Leahy Scale: Good     Standing balance support: Bilateral upper extremity supported Standing balance-Leahy Scale: Poor                              Cognition Arousal/Alertness: Awake/alert Behavior During Therapy: WFL for tasks assessed/performed Overall Cognitive Status: Within Functional Limits for tasks assessed                                        Exercises Other Exercises Other Exercises: transfers to/from commode - NWB LLE, some increased anxiety towards end of session when fatigued. Other Exercises: supine ankle pumps, SLR, ab/add BLE 2 x 10, hip/knee flex and SAQ 2 x 10 RLR only    General Comments        Pertinent Vitals/Pain Pain Assessment: 0-10 Pain Score: 4  Pain Location: R knee Pain Descriptors / Indicators: Aching;Grimacing;Guarding Pain Intervention(s): Limited activity within patient's tolerance;Monitored during session    Home Living                      Prior Function            PT Goals (current goals can now be found in the care plan section) Progress towards PT goals: Progressing toward goals    Frequency  7X/week      PT Plan Current plan remains appropriate    Co-evaluation              AM-PAC PT "6 Clicks" Daily Activity  Outcome Measure  Difficulty turning over in bed (including adjusting bedclothes, sheets and blankets)?: Unable Difficulty moving from lying on back to sitting on the side of the bed? : Unable Difficulty sitting down on and standing up from a chair with arms (e.g., wheelchair, bedside commode, etc,.)?: Unable Help needed moving to and from a bed to chair (including a wheelchair)?: A Lot Help needed walking in hospital room?: Total Help needed climbing 3-5 steps with a railing? : Total 6 Click Score: 7    End of Session Equipment Utilized During Treatment: Gait belt Activity Tolerance: Patient  tolerated treatment well Patient left: with call bell/phone within reach;in chair;with chair alarm set;with nursing/sitter in room Nurse Communication: Mobility status Pain - Right/Left: Right Pain - part of body: Knee     Time: 2706-2376 PT Time Calculation (min) (ACUTE ONLY): 29 min  Charges:  $Therapeutic Exercise: 8-22 mins $Therapeutic Activity: 8-22 mins                    G CodesChesley Noon, PTA 03/11/17, 11:37 AM '

## 2017-03-11 NOTE — Discharge Summary (Signed)
[facility] NAME: Stephanie [name]    MR#:  [id number]  DATE OF BIRTH:  Apr 14, 1951  DATE OF ADMISSION:  03/07/2017 ADMITTING PHYSICIAN: Demetrios Loll, MD  DATE OF DISCHARGE: 03/11/2017  PRIMARY CARE PHYSICIAN: Sarajane Jews, MD    ADMISSION DIAGNOSIS:  Altered mental status, unspecified altered mental status type [R41.82]  DISCHARGE DIAGNOSIS:  Active Problems:   Altered mental status   SECONDARY DIAGNOSIS:   Past Medical History:  Diagnosis Date  . Adenomatous colon polyp 08/27/2014   Polyps x 3  . Anemia   . Anxiety   . 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 Arthritis   . Asthma   . Bipolar affective disorder ([place])    h/o  . Blood transfusion 2000  . Blood transfusion without reported diagnosis   . Cancer ([place])   . Chronic abdominal pain    abdominal wall pain  . 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 Colon polyp   . Depression   . Diabetes mellitus without complication ([place])   . Fatty liver 04/09/08   found in abd CT  . GERD (gastroesophageal reflux disease)   . H/O: CVA (cardiovascular accident)    TIA  . H/O: rheumatic fever   . Hepatitis    HEP "B" twenty years ago  . Hyperlipidemia   . Incontinence   . Insomnia   . Lower extremity edema   . Obstructive sleep apnea    Use C-PAP twice per week  . Platelets decreased ([place])   . RLS (restless legs syndrome)   . Shortness of breath   . Sleep apnea   . Stroke [facility])    TIA- 2002  . Ulcer   . Unspecified disorders of nervous system     HOSPITAL COURSE:   66 year old female with past medical history of diabetes, anxiety, depression, obstructive sleep apnea, restless leg syndrome, history of previous CVA, bipolar disorder, nonalcoholic liver cirrhosis who presented to the hospital due to a fall and noted to have a left femur fracture on with a meniscal tear in the left knee.  1. Status post fall with left medial nondisplaced femur fracture and meniscal tear on  the left knee-patient was admitted to the hospital and orthopedics was consulted. -Patient was seen by orthopedics and they recommended no acute surgical intervention.  Patient was seen by Dr. Mack Guise and patient now is a left lower extremity splint. Patient is to be nonweightbearing on left lower extremity for a minimum of 6-8 weeks. She'll follow up with orthopedics in 2 weeks. Patient was seen by physical therapy and recommended short-term rehabilitation which is where she is presently being discharged. -As follows a meniscal tears are concerned orthopedics wants to first met her fracture healed before doing any intervention for her meniscal tear. As per orthopedics arthroscopic partial meniscectomy can be done as an outpatient after her fracture heals. -Patient will continue oxycodone as needed for pain.  2. Altered mental status-this was secondary to polypharmacy. Patient's medications have been adjusted. Patient was on multiple anxiolytics including Klonopin, Xanax and also Ativan. At presently patient is only being discharged on Xanax as needed.  3. Anxiety - pt. Will cont. Xanax.   4. History of bipolar disorder-patient will continue her Lamictal.  5. Neuropathy-patient will continue her gabapentin, Lyrica.   6. Depression - pt. Will cont. Wellbutrin.   7. Restless leg syndrome-patient will continue her Requip.  8. Hyperlipidemia-patient will continue  her simvastatin.  9. Diabetes type 2 without complication-patient will continue Lantus, NovoLog with meals. Blood sugars remained stable on the hospital.  DISCHARGE CONDITIONS:   Stable  CONSULTS OBTAINED:  Treatment Team:  Thornton Park, MD  DRUG ALLERGIES:   Allergies  Allergen Reactions  . Doxycycline Hives, Swelling and Other (See Comments)    Reaction:  Facial swelling     DISCHARGE MEDICATIONS:   Allergies as of 03/11/2017      Reactions   Doxycycline Hives, Swelling, Other (See Comments)   Reaction:  Facial  swelling       Medication List    STOP taking these medications   clonazePAM 0.5 MG tablet Commonly known as:  KLONOPIN   diclofenac sodium 1 % Gel Commonly known as:  VOLTAREN   omeprazole 20 MG capsule Commonly known as:  PRILOSEC   tiZANidine 4 MG tablet Commonly known as:  ZANAFLEX   traMADol 50 MG tablet Commonly known as:  ULTRAM     TAKE these medications   acetaminophen 325 MG tablet Commonly known as:  TYLENOL Take 650 mg by mouth every 4 (four) hours as needed (for fever, minor discomfort, or headache).   albuterol (2.5 MG/3ML) 0.083% nebulizer solution Commonly known as:  PROVENTIL Take 2.5 mg by nebulization every 6 (six) hours as needed for wheezing or shortness of breath.   albuterol 108 (90 Base) MCG/ACT inhaler Commonly known as:  PROVENTIL HFA;VENTOLIN HFA Inhale 2 puffs into the lungs every 6 (six) hours as needed for wheezing or shortness of breath.   ALPRAZolam 0.25 MG tablet Commonly known as:  XANAX Take 1 tablet (0.25 mg total) by mouth 3 (three) times daily as needed for anxiety. What changed:  how much to take  when to take this  reasons to take this  Another medication with the same name was removed. Continue taking this medication, and follow the directions you see here.   ANTACID 200-200-20 MG/5ML suspension Generic drug:  alum & mag hydroxide-simeth Take 30 mLs by mouth every 4 (four) hours as needed for indigestion or heartburn.   aspirin 325 MG tablet Take 325 mg by mouth daily.   bacitracin-polymyxin b ointment Commonly known as:  POLYSPORIN Apply topically 3 (three) times daily. Apply to left earlobe   barrier cream Crea Commonly known as:  non-specified Apply 1 application topically as needed (APPLIED AFTER TOILETING TO PREVENT SKIN BREAKDOWN).   budesonide-formoterol 160-4.5 MCG/ACT inhaler Commonly known as:  SYMBICORT Inhale 2 puffs into the lungs 2 (two) times daily. (0800 & 2000)   buPROPion 150 MG 12 hr  tablet Commonly known as:  WELLBUTRIN SR Take 150 mg by mouth daily. (0800)   dicyclomine 10 MG capsule Commonly known as:  BENTYL Take 10 mg by mouth 3 (three) times daily. (0800, 1300 & 2000)   DIPHENHIST 25 mg capsule Generic drug:  diphenhydrAMINE Take 25 mg by mouth every 6 (six) hours as needed (for allergic reaction (notify provider is symptoms persist more than 24 hrs*).   eucerin cream Apply topically 2 (two) times daily.   EUCERIN INTENSIVE REPAIR Lotn Apply 1 application topically 2 (two) times daily.   ferrous sulfate 325 (65 FE) MG tablet Take 325 mg by mouth 2 (two) times daily. (0800 & 2000)   furosemide 80 MG tablet Commonly known as:  LASIX Take 80 mg by mouth daily. (0800)   gabapentin 600 MG tablet Commonly known as:  NEURONTIN Take 600 mg by mouth daily. Take 600 mg by mouth  in the morning and 900 mg by mouth at bedtime.   gabapentin 300 MG capsule Commonly known as:  NEURONTIN Take 900 mg by mouth at bedtime.   GOLD BOND EX Apply 1 application topically daily.   guaifenesin 100 MG/5ML syrup Commonly known as:  ROBITUSSIN Take 300 mg by mouth every 6 (six) hours as needed for cough.   HUMALOG KWIKPEN 100 UNIT/ML KiwkPen Generic drug:  insulin lispro Inject 0-12 Units into the skin 3 (three) times daily. Check FSBS before each meal & cover with SSI (sliding scale insulin)   insulin glargine 100 UNIT/ML injection Commonly known as:  LANTUS Inject 28 Units into the skin at bedtime.   lactulose 10 GM/15ML solution Commonly known as:  CHRONULAC Take 10 g by mouth every Monday, Wednesday, and Friday.   lamoTRIgine 100 MG tablet Commonly known as:  LAMICTAL Take 100 mg by mouth 2 (two) times daily. (0800 & 2000)   lidocaine 5 % Commonly known as:  LIDODERM Place 3 patches onto the skin daily. Apply to back   loperamide 2 MG capsule Commonly known as:  IMODIUM Take 4 mg by mouth as needed for diarrhea or loose stools. *Do not exceed 8 doses in  24 hours*   LYRICA 150 MG capsule Generic drug:  pregabalin Take 150 mg by mouth 3 (three) times daily.   magnesium hydroxide 400 MG/5ML suspension Commonly known as:  MILK OF MAGNESIA Take 30 mLs by mouth daily as needed (for constipation).   metoCLOPramide 5 MG tablet Commonly known as:  REGLAN Take 5 mg by mouth 4 (four) times daily -  before meals and at bedtime. (0800, 1200, 1600 & 2000)   mirtazapine 45 MG tablet Commonly known as:  REMERON Take 45 mg by mouth at bedtime. (2000)   ondansetron 4 MG tablet Commonly known as:  ZOFRAN Take 4 mg by mouth every 4 (four) hours as needed for nausea or vomiting.   oxybutynin 5 MG 24 hr tablet Commonly known as:  DITROPAN-XL Take 5 mg by mouth daily. (0800)   oxyCODONE 5 MG immediate release tablet Commonly known as:  ROXICODONE Take 1 tablet (5 mg total) by mouth every 4 (four) hours as needed for severe pain. What changed:  Another medication with the same name was removed. Continue taking this medication, and follow the directions you see here.   potassium chloride SA 20 MEQ tablet Commonly known as:  K-DUR,KLOR-CON Take 20 mEq by mouth daily. (0800)   pyrithione zinc 1 % shampoo Commonly known as:  HEAD AND SHOULDERS Apply 1 application topically daily. t-gel or head and shoulders.   rOPINIRole 3 MG tablet Commonly known as:  REQUIP Take 3 mg by mouth at bedtime. (2000)   simvastatin 10 MG tablet Commonly known as:  ZOCOR Take 10 mg by mouth at bedtime. (2000)   spironolactone 25 MG tablet Commonly known as:  ALDACTONE Take 25 mg by mouth daily. (0800)   vitamin C 500 MG tablet Commonly known as:  ASCORBIC ACID Take 500 mg by mouth 2 (two) times daily. (0800 & 2000)   XYLIMELTS 500 MG Disk Generic drug:  Xylitol Use as directed 1 capsule in the mouth or throat 3 (three) times daily.   zolpidem 5 MG tablet Commonly known as:  AMBIEN Take 5 mg by mouth at bedtime. (2000)         DISCHARGE  INSTRUCTIONS:   DIET:  Cardiac diet and Diabetic diet  DISCHARGE CONDITION:  Stable  ACTIVITY:  Activity as tolerated  OXYGEN:  Home Oxygen: No.   Oxygen Delivery: room air  DISCHARGE LOCATION:  nursing home   If you experience worsening of your admission symptoms, develop shortness of breath, life threatening emergency, suicidal or homicidal thoughts you must seek medical attention immediately by calling 911 or calling your MD immediately  if symptoms less severe.  You Must read complete instructions/literature along with all the possible adverse reactions/side effects for all the Medicines you take and that have been prescribed to you. Take any new Medicines after you have completely understood and accpet all the possible adverse reactions/side effects.   Please note  You were cared for by a hospitalist during your hospital stay. If you have any questions about your discharge medications or the care you received while you were in the hospital after you are discharged, you can call the unit and asked to speak with the hospitalist on call if the hospitalist that took care of you is not available. Once you are discharged, your primary care physician will handle any further medical issues. Please note that NO REFILLS for any discharge medications will be authorized once you are discharged, as it is imperative that you return to your primary care physician (or establish a relationship with a primary care physician if you do not have one) for your aftercare needs so that they can reassess your need for medications and monitor your lab values.     Today   Still having some pain in the left leg. Worked with PT and they recommend SNF/STR. Pt. To be discharged there today and outpatient Ortho follow up.   VITAL SIGNS:  Blood pressure (!) 116/53, pulse 93, temperature 98 F (36.7 C), temperature source Oral, resp. rate 14, height 5' 3"  (1.6 m), weight 102 kg (224 lb 13.9 oz), SpO2 97  %.  I/O:    Intake/Output Summary (Last 24 hours) at 03/11/17 1301 Last data filed at 03/11/17 1233  Gross per 24 hour  Intake                0 ml  Output             1700 ml  Net            -1700 ml    PHYSICAL EXAMINATION:  GENERAL:  66 y.o.-year-old patient lying in the bed with no acute distress.  EYES: Pupils equal, round, reactive to light and accommodation. No scleral icterus. Extraocular muscles intact.  HEENT: Head atraumatic, normocephalic. Oropharynx and nasopharynx clear.  NECK:  Supple, no jugular venous distention. No thyroid enlargement, no tenderness.  LUNGS: Normal breath sounds bilaterally, no wheezing, rales,rhonchi. No use of accessory muscles of respiration.  CARDIOVASCULAR: S1, S2 normal. No murmurs, rubs, or gallops.  ABDOMEN: Soft, non-tender, non-distended. Bowel sounds present. No organomegaly or mass.  EXTREMITIES: No pedal edema, cyanosis, or clubbing. LLE in a splint NEUROLOGIC: Cranial nerves II through XII are intact. No focal motor or sensory defecits b/l.  PSYCHIATRIC: The patient is alert and oriented x 3.  SKIN: No obvious rash, lesion, or ulcer.   DATA REVIEW:   CBC  Recent Labs Lab 03/08/17 0355  WBC 7.1  HGB 12.5  HCT 36.7  PLT 104*    Chemistries   Recent Labs Lab 03/07/17 2020 03/08/17 0355  NA 140 141  K 3.8 3.9  CL 102 104  CO2 29 28  GLUCOSE 100* 140*  BUN 17 15  CREATININE 0.87 0.76  CALCIUM 9.0 8.7*  AST 35  --   ALT 31  --   ALKPHOS 259*  --   BILITOT 1.1  --     Cardiac Enzymes  Recent Labs Lab 03/07/17 2028  TROPONINI <0.03    Microbiology Results  Results for orders placed or performed during the hospital encounter of 03/07/17  MRSA PCR Screening     Status: None   Collection Time: 03/08/17  5:50 AM  Result Value Ref Range Status   MRSA by PCR NEGATIVE NEGATIVE Final    Comment:        The GeneXpert MRSA Assay (FDA approved for NASAL specimens only), is one component of a comprehensive MRSA  colonization surveillance program. It is not intended to diagnose MRSA infection nor to guide or monitor treatment for MRSA infections.    *Note: Due to a large number of results and/or encounters for the requested time period, some results have not been displayed. A complete set of results can be found in Results Review.    RADIOLOGY:  No results found.    Management plans discussed with the patient, family and they are in agreement.  CODE STATUS:     Code Status Orders        Start     Ordered   03/08/17 0155  Full code  Continuous     03/08/17 0154      Advance Directive Documentation     Most Recent Value  Type of Advance Directive  Healthcare Power of Attorney, Living will  Pre-existing out of facility DNR order (yellow form or pink MOST form)  -  "MOST" Form in Place?  -      TOTAL TIME TAKING CARE OF THIS PATIENT: 45 minutes.    Henreitta Leber M.D on 03/11/2017 at 1:01 PM  Between 7am to 6pm - Pager - [phone]  After 6pm go to [email or web address] - Proofreader  [facility]  Office  [phone]  CC: Primary care physician; Sarajane Jews, MD

## 2017-03-11 NOTE — Progress Notes (Signed)
Pt transported to [facility] via em[facility]

## 2017-03-14 ENCOUNTER — Ambulatory Visit (INDEPENDENT_AMBULATORY_CARE_PROVIDER_SITE_OTHER): Payer: Medicare Other | Admitting: Vascular Surgery

## 2017-03-17 ENCOUNTER — Ambulatory Visit (INDEPENDENT_AMBULATORY_CARE_PROVIDER_SITE_OTHER): Payer: Medicare Other | Admitting: Vascular Surgery

## 2017-03-18 IMAGING — CR DG CHEST 2V
2 series · 2 of 2 positions shown · non-contrast
Comparison: 07/19/2016.  07/14/2016.  CT 06/28/2016 .

CLINICAL DATA: Shortness of breath.  Cough and congestion .

EXAM:
CHEST  2 VIEW

[chest pa]
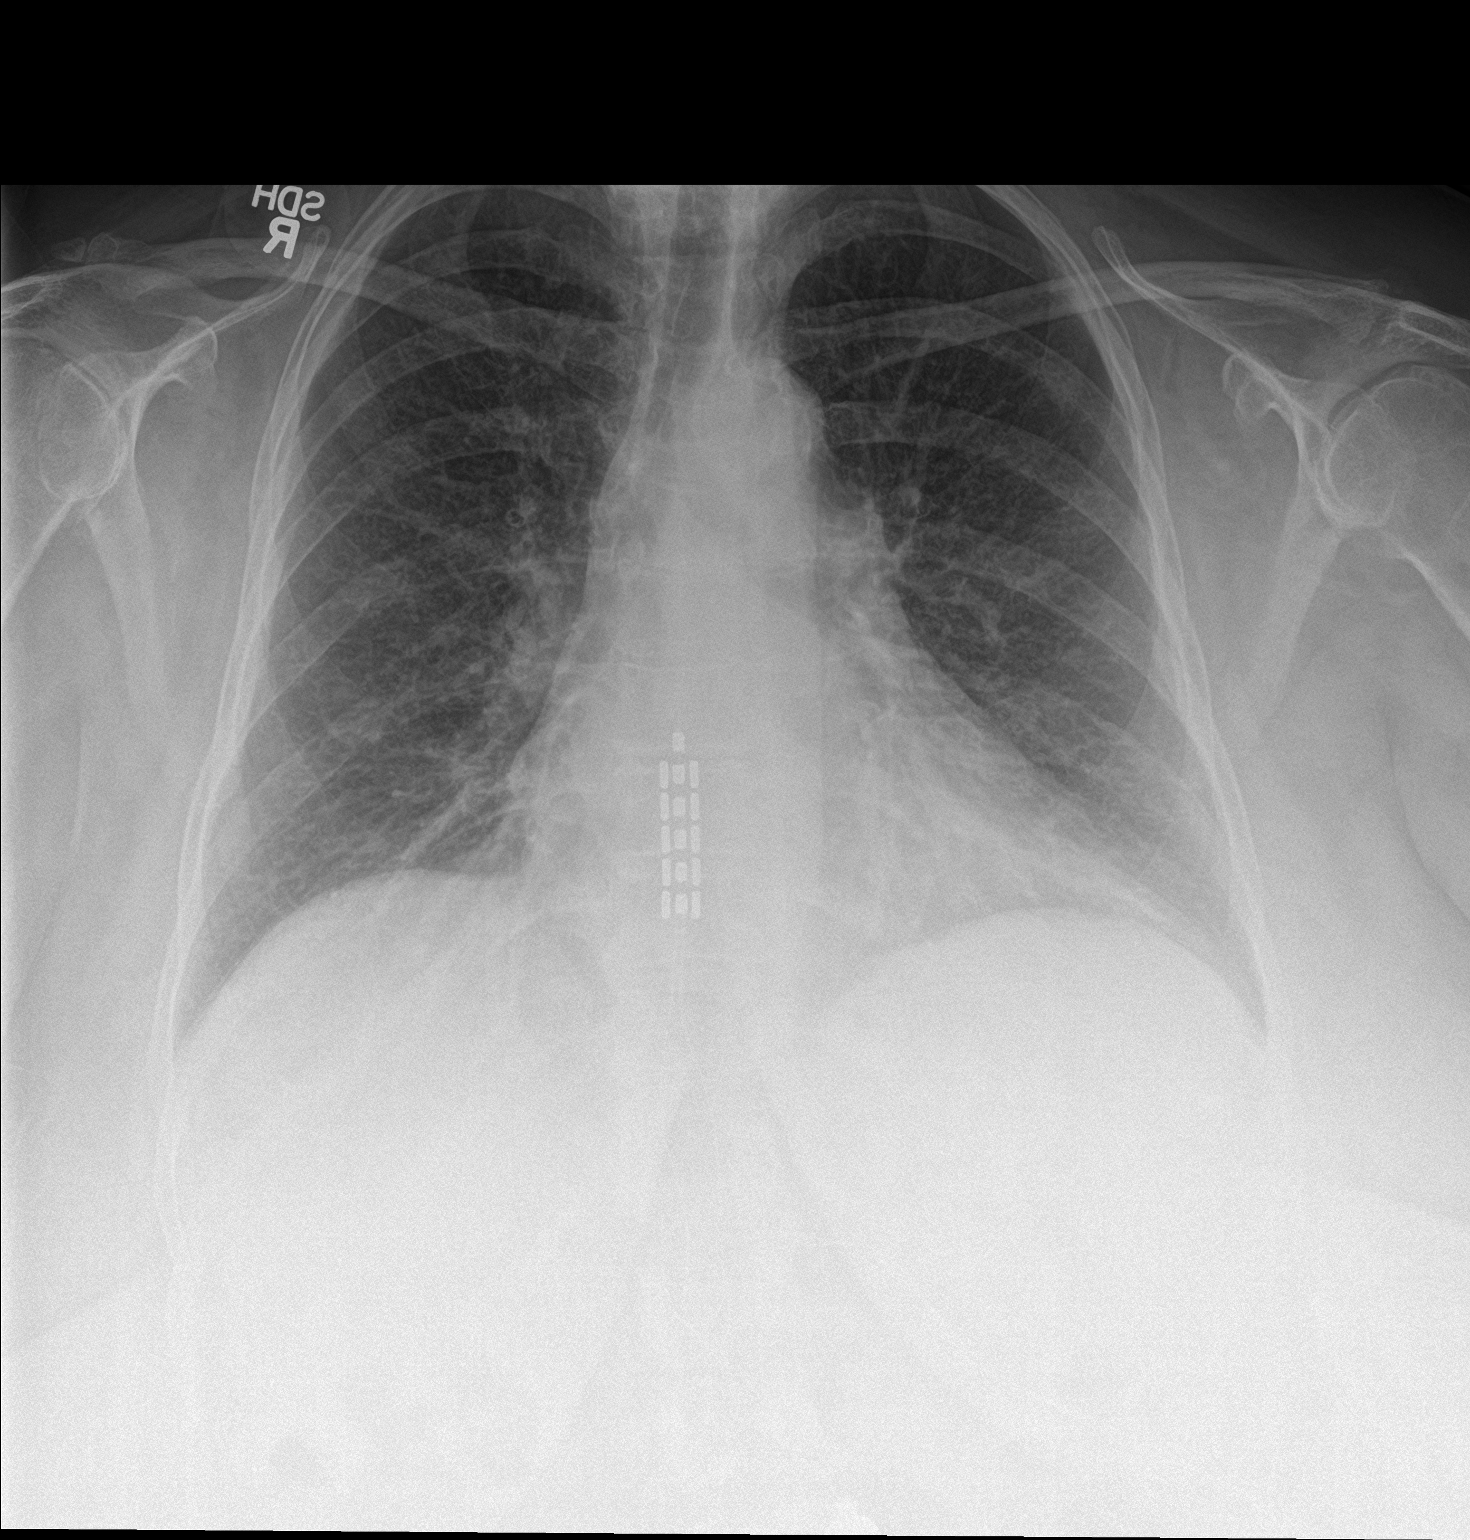

[chest lat]
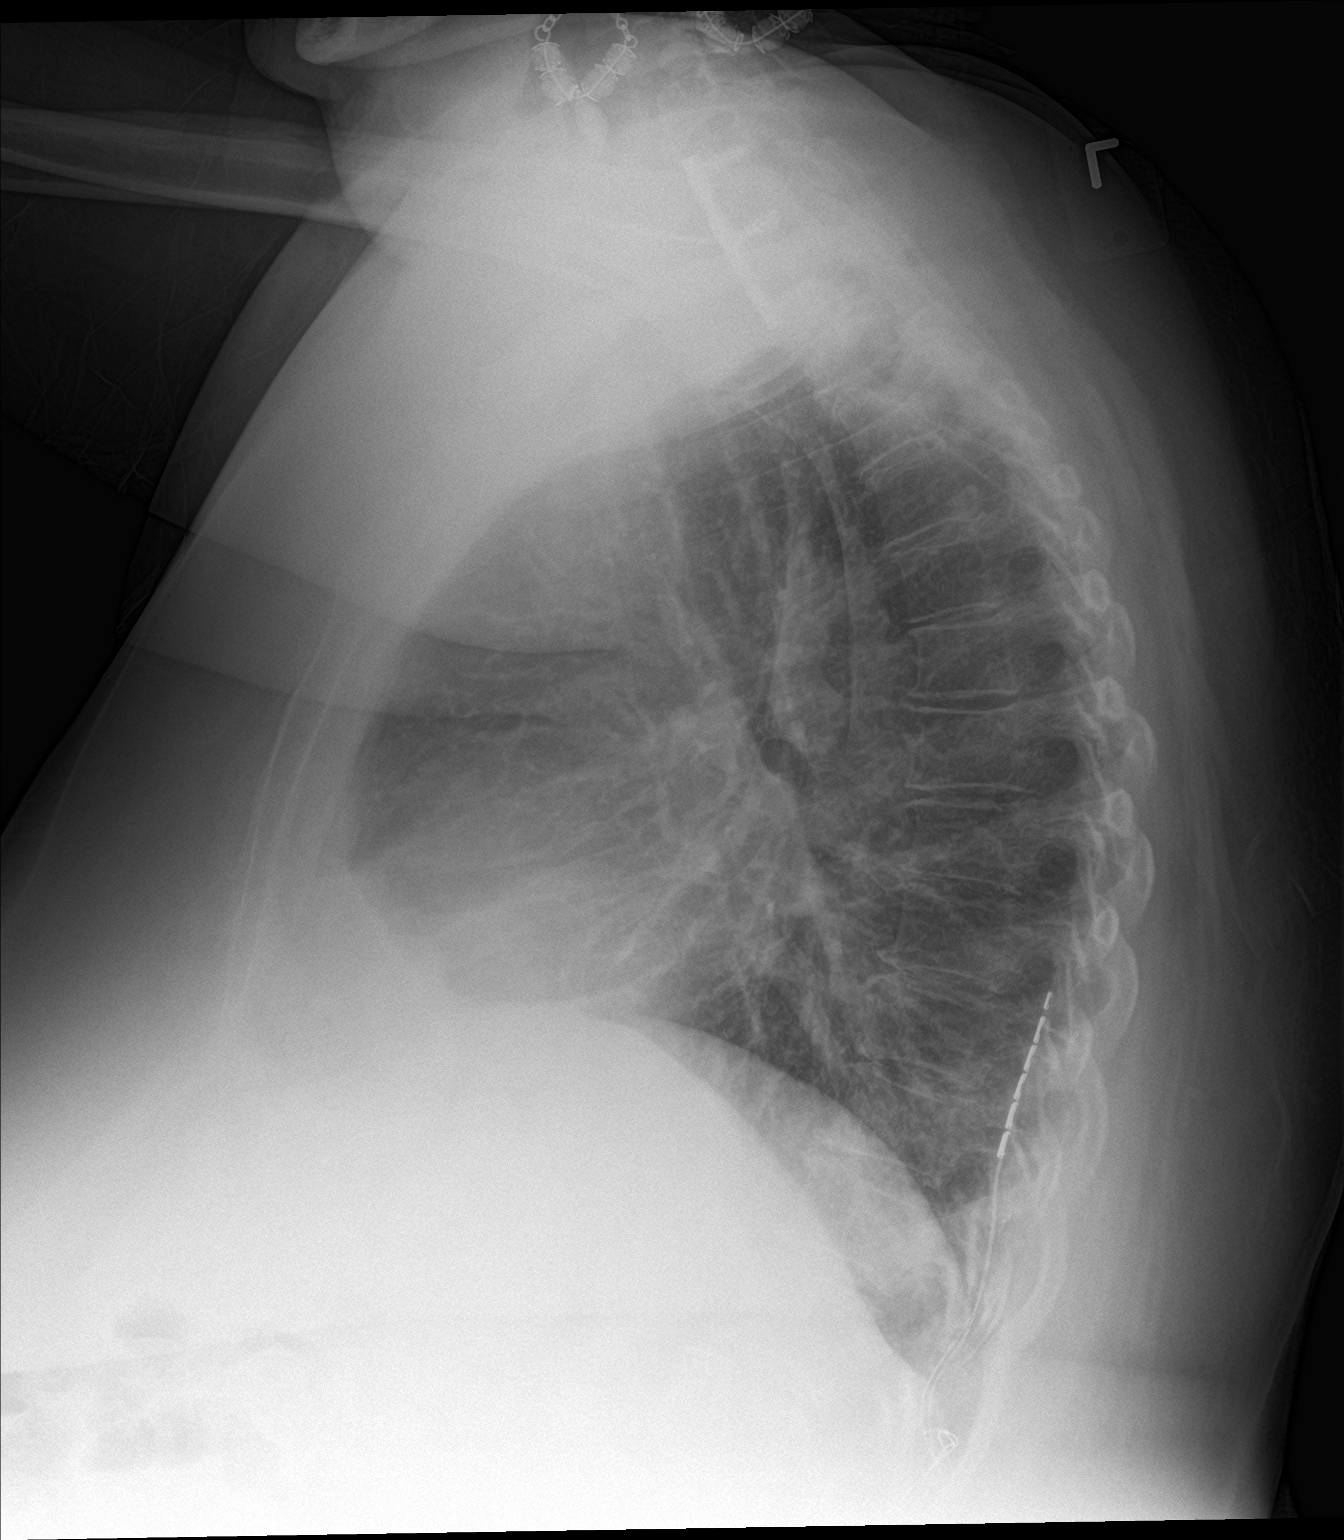

[2 of 2 positions shown; findings below may reference images not displayed]

FINDINGS: Stable cardiomegaly. No pulmonary venous congestion. Stable mild
bilateral interstitial prominence noted. No pleural effusion or
pneumothorax. Neurostimulator noted in stable position. Prior
cervicothoracic spine fusion.
IMPRESSION: 1. Stable cardiomegaly.

2. Stable chronic interstitial changes. No acute pulmonary disease .

## 2017-03-23 ENCOUNTER — Ambulatory Visit: Payer: Medicare Other | Admitting: Student in an Organized Health Care Education/Training Program

## 2017-03-31 ENCOUNTER — Ambulatory Visit: Payer: Medicare Other

## 2017-04-07 ENCOUNTER — Encounter: Payer: Self-pay | Admitting: Hematology and Oncology

## 2017-04-10 ENCOUNTER — Inpatient Hospital Stay: Payer: Medicare Other | Attending: Hematology and Oncology | Admitting: Hematology and Oncology

## 2017-04-10 VITALS — BP 110/68 | HR 87 | Temp 97.3°F | Resp 18 | Wt 236.0 lb

## 2017-04-10 DIAGNOSIS — R0602 Shortness of breath: Secondary | ICD-10-CM | POA: Diagnosis not present

## 2017-04-10 DIAGNOSIS — E119 Type 2 diabetes mellitus without complications: Secondary | ICD-10-CM | POA: Diagnosis not present

## 2017-04-10 DIAGNOSIS — G8929 Other chronic pain: Secondary | ICD-10-CM

## 2017-04-10 DIAGNOSIS — D696 Thrombocytopenia, unspecified: Secondary | ICD-10-CM | POA: Diagnosis present

## 2017-04-10 DIAGNOSIS — E785 Hyperlipidemia, unspecified: Secondary | ICD-10-CM

## 2017-04-10 DIAGNOSIS — K219 Gastro-esophageal reflux disease without esophagitis: Secondary | ICD-10-CM

## 2017-04-10 DIAGNOSIS — R4182 Altered mental status, unspecified: Secondary | ICD-10-CM | POA: Diagnosis not present

## 2017-04-10 DIAGNOSIS — G2581 Restless legs syndrome: Secondary | ICD-10-CM

## 2017-04-10 DIAGNOSIS — F419 Anxiety disorder, unspecified: Secondary | ICD-10-CM | POA: Diagnosis not present

## 2017-04-10 DIAGNOSIS — G4700 Insomnia, unspecified: Secondary | ICD-10-CM | POA: Diagnosis not present

## 2017-04-10 DIAGNOSIS — K7460 Unspecified cirrhosis of liver: Secondary | ICD-10-CM | POA: Diagnosis not present

## 2017-04-10 DIAGNOSIS — F329 Major depressive disorder, single episode, unspecified: Secondary | ICD-10-CM

## 2017-04-10 DIAGNOSIS — R109 Unspecified abdominal pain: Secondary | ICD-10-CM | POA: Diagnosis not present

## 2017-04-10 DIAGNOSIS — Z7982 Long term (current) use of aspirin: Secondary | ICD-10-CM | POA: Diagnosis not present

## 2017-04-10 DIAGNOSIS — F319 Bipolar disorder, unspecified: Secondary | ICD-10-CM

## 2017-04-10 DIAGNOSIS — R609 Edema, unspecified: Secondary | ICD-10-CM | POA: Diagnosis not present

## 2017-04-10 DIAGNOSIS — Z794 Long term (current) use of insulin: Secondary | ICD-10-CM

## 2017-04-10 DIAGNOSIS — K7581 Nonalcoholic steatohepatitis (NASH): Secondary | ICD-10-CM

## 2017-04-10 DIAGNOSIS — Z8673 Personal history of transient ischemic attack (TIA), and cerebral infarction without residual deficits: Secondary | ICD-10-CM | POA: Diagnosis not present

## 2017-04-10 DIAGNOSIS — R748 Abnormal levels of other serum enzymes: Secondary | ICD-10-CM | POA: Diagnosis not present

## 2017-04-10 DIAGNOSIS — G4730 Sleep apnea, unspecified: Secondary | ICD-10-CM

## 2017-04-10 DIAGNOSIS — Z800 Family history of malignant neoplasm of digestive organs: Secondary | ICD-10-CM

## 2017-04-10 DIAGNOSIS — D509 Iron deficiency anemia, unspecified: Secondary | ICD-10-CM

## 2017-04-10 DIAGNOSIS — Z87891 Personal history of nicotine dependence: Secondary | ICD-10-CM | POA: Diagnosis not present

## 2017-04-10 DIAGNOSIS — Z79899 Other long term (current) drug therapy: Secondary | ICD-10-CM

## 2017-04-10 DIAGNOSIS — J45909 Unspecified asthma, uncomplicated: Secondary | ICD-10-CM | POA: Diagnosis not present

## 2017-04-10 DIAGNOSIS — Z86010 Personal history of colonic polyps: Secondary | ICD-10-CM

## 2017-04-10 DIAGNOSIS — Z801 Family history of malignant neoplasm of trachea, bronchus and lung: Secondary | ICD-10-CM

## 2017-04-10 DIAGNOSIS — Z803 Family history of malignant neoplasm of breast: Secondary | ICD-10-CM

## 2017-04-10 NOTE — Progress Notes (Signed)
[name] day:  04/10/2017  Chief Complaint: Stephanie [name] is a 66 y.o. female with chronic thrombocytopenia and iron deficiency anemia who is seen for 3 month assessment.  HPI: The patient was last seen in the medical oncology clinic on 01/09/2017.  At that time, she noted increased bruising.  Exam revealed scattered bruises and no petechiae.  [facility] labs revealed a platelet count of 127,000 with a normal PT and PTT.  She was referred to Dr. Allen Norris for cirrhosis.  She was seen on 03/07/2017.  Discussions were held about referral to [facility] for a possible liver transplant.  Patient needs to have hepatic ultrasound prior to [facility] appointment. Of note, the study was ordered, however she has not had the exam done at this point.   She was admitted to [facility] from 03/07/2017 - 03/11/2017 with altered mental status.  She presented due to a fall and was noted to have a left femur fracture and a meniscal tear in the left knee.  Left knee MRI on 03/08/2017 revealed a non-displaced fracture through the medial aspect of the distal femoral metaphysis and a horizontal tear of the medial meniscus body and posterior horn. As well as a complex tear of the lateral meniscus.  She did not undergo surgery, but was placed in a splint.  Altered mental status was felt secondary to polypharmacy.  Symptomatically, patient is doing "alright". Patient complains of pain in her lower extremities; L>R. Patient is scheduled to see orthopedic surgeon in the next couple of weeks. She is not wearing her prescribed splint today. Patient denies any  bleeding. She has areas of unexplained bruising.  She has experienced no B symptoms or interval infections. Patient is eating well, with no demonstrated weight loss.    Past Medical History:  Diagnosis Date  . Adenomatous colon polyp 08/27/2014   Polyps x 3  . Anemia   . Anxiety   . 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 Arthritis   . Asthma    . Bipolar affective disorder ([place])    h/o  . Blood transfusion 2000  . Blood transfusion without reported diagnosis   . Cancer ([place])   . Chronic abdominal pain    abdominal wall pain  . 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 Colon polyp   . Depression   . Diabetes mellitus without complication ([place])   . Fatty liver 04/09/08   found in abd CT  . GERD (gastroesophageal reflux disease)   . H/O: CVA (cardiovascular accident)    TIA  . H/O: rheumatic fever   . Hepatitis    HEP "B" twenty years ago  . Hyperlipidemia   . Incontinence   . Insomnia   . Lower extremity edema   . Obstructive sleep apnea    Use C-PAP twice per week  . Platelets decreased ([place])   . RLS (restless legs syndrome)   . Shortness of breath   . Sleep apnea   . Stroke [facility])    TIA- 2002  . Ulcer   . Unspecified disorders of nervous system     Past Surgical History:  Procedure Laterality Date  . ABDOMINAL EXPLORATION SURGERY    . ABDOMINAL HYSTERECTOMY     total  . Axillary artery cannulation via 8-mm Hemashield graft, median sternotomy, extracorporeal circulation with deep hypothermic circulatory arrest, repair of  aortic dessection  01/23/2009   Dr Arlyce Dice  . BACK SURGERY  x 5  . cataract     both eyes  . CHOLECYSTECTOMY    . COLONOSCOPY    . EYE SURGERY Bilateral    Cataract Extraction with IOL  . HEMORROIDECTOMY     and colon polyp removed  . LUMBAR FUSION  10/09  . POSTERIOR CERVICAL FUSION/FORAMINOTOMY    . ROTATOR CUFF REPAIR     left    Family History  Problem Relation Age of Onset  . Breast cancer Mother   . Lung cancer Mother   . Stomach cancer Father   . Diabetes Unknown        4 aunts, and 1 uncle  . Colon cancer Neg Hx   . Esophageal cancer Neg Hx   . Rectal cancer Neg Hx     Social History:  reports that she quit smoking about 4 years ago. Her smoking use included cigarettes. She has a 75.00 pack-year smoking history. she has never used smokeless tobacco. She reports  that she does not drink alcohol or use drugs.  She is a resident at the [facility].  She is accompanied by the transportation specialist from [place] today.  Allergies:  Allergies  Allergen Reactions  . Doxycycline Hives, Swelling, Other (See Comments) and Nausea Only    Reaction:  Facial swelling  Face red and swollen; no difficulty breathing    Current Medications: Current Outpatient Medications  Medication Sig Dispense Refill  . albuterol (PROVENTIL HFA;VENTOLIN HFA) 108 (90 Base) MCG/ACT inhaler Inhale 2 puffs into the lungs every 6 (six) hours as needed for wheezing or shortness of breath. 1 Inhaler 2  . amoxicillin-clavulanate (AUGMENTIN) 600-42.9 MG/5ML suspension     . aspirin 325 MG tablet Take 325 mg by mouth daily.     Marland Kitchen atorvastatin (LIPITOR) 40 MG tablet     . buPROPion (WELLBUTRIN SR) 150 MG 12 hr tablet Take 150 mg by mouth daily. (0800)    . clonazePAM (KLONOPIN) 0.5 MG tablet Take by mouth.    . dicyclomine (BENTYL) 10 MG capsule Take 10 mg by mouth 3 (three) times daily. (0800, 1300 & 2000)    . diphenhydrAMINE (DIPHENHIST) 25 mg capsule Take 25 mg by mouth every 6 (six) hours as needed (for allergic reaction (notify provider is symptoms persist more than 24 hrs*).    . ferrous sulfate 325 (65 FE) MG tablet Take 325 mg by mouth 2 (two) times daily. (0800 & 2000)    . furosemide (LASIX) 80 MG tablet Take 80 mg by mouth daily. (0800)    . gabapentin (NEURONTIN) 300 MG capsule Take 900 mg by mouth at bedtime.    . gabapentin (NEURONTIN) 600 MG tablet Take 600 mg by mouth daily. Take 600 mg by mouth in the morning and 900 mg by mouth at bedtime.    . insulin glargine (LANTUS) 100 UNIT/ML injection Inject 28 Units into the skin at bedtime.    . insulin lispro (HUMALOG KWIKPEN) 100 UNIT/ML KiwkPen Inject 0-12 Units into the skin 3 (three) times daily. Check FSBS before each meal & cover with SSI (sliding scale insulin)     . lactulose (CHRONULAC) 10 GM/15ML solution Take  10 g by mouth every Monday, Wednesday, and Friday.     . lamoTRIgine (LAMICTAL) 100 MG tablet Take 100 mg by mouth 2 (two) times daily. (0800 & 2000)    . lidocaine (LIDODERM) 5 % Place 3 patches onto the skin daily. Apply to back    . loperamide (IMODIUM) 2  MG capsule Take 4 mg by mouth as needed for diarrhea or loose stools. *Do not exceed 8 doses in 24 hours*    . LYRICA 150 MG capsule Take 150 mg by mouth 3 (three) times daily.    . metoCLOPramide (REGLAN) 5 MG tablet Take 5 mg by mouth 4 (four) times daily -  before meals and at bedtime. (0800, 1200, 1600 & 2000)    . mirtazapine (REMERON) 45 MG tablet Take 45 mg by mouth at bedtime. (2000)     . omeprazole (PRILOSEC) 20 MG capsule Take by mouth.    . oxybutynin (DITROPAN-XL) 5 MG 24 hr tablet Take 5 mg by mouth daily. (0800)    . oxyCODONE-acetaminophen (PERCOCET) 7.5-325 MG tablet     . rOPINIRole (REQUIP) 3 MG tablet Take 3 mg by mouth at bedtime. (2000)    . simvastatin (ZOCOR) 10 MG tablet Take 10 mg by mouth at bedtime. (2000)    . spironolactone (ALDACTONE) 25 MG tablet Take 25 mg by mouth daily. (0800)    . vitamin C (ASCORBIC ACID) 500 MG tablet Take 500 mg by mouth 2 (two) times daily. (0800 & 2000)    . Xylitol (XYLIMELTS) 500 MG DISK Use as directed 1 capsule in the mouth or throat 3 (three) times daily.    Marland Kitchen zolpidem (AMBIEN) 5 MG tablet Take 5 mg by mouth at bedtime. (2000)    . albuterol (PROVENTIL) (2.5 MG/3ML) 0.083% nebulizer solution Take 2.5 mg by nebulization every 6 (six) hours as needed for wheezing or shortness of breath.    . ALPRAZolam (XANAX) 0.25 MG tablet Take 1 tablet (0.25 mg total) by mouth 3 (three) times daily as needed for anxiety. (Patient not taking: Reported on 04/10/2017) 30 tablet 0  . alum & mag hydroxide-simeth (ANTACID) 200-200-20 MG/5ML suspension Take 30 mLs by mouth every 4 (four) hours as needed for indigestion or heartburn.    . bacitracin-polymyxin b (POLYSPORIN) ointment Apply topically 3  (three) times daily. Apply to left earlobe    . barrier cream (NON-SPECIFIED) CREA Apply 1 application topically as needed (APPLIED AFTER TOILETING TO PREVENT SKIN BREAKDOWN).    Marland Kitchen budesonide-formoterol (SYMBICORT) 160-4.5 MCG/ACT inhaler Inhale 2 puffs into the lungs 2 (two) times daily. (0800 & 2000)    . diclofenac sodium (VOLTAREN) 1 % GEL Apply topically.    . Emollient (EUCERIN INTENSIVE REPAIR) LOTN Apply 1 application topically 2 (two) times daily.    Marland Kitchen guaifenesin (ROBITUSSIN) 100 MG/5ML syrup Take 300 mg by mouth every 6 (six) hours as needed for cough.     . magnesium hydroxide (MILK OF MAGNESIA) 400 MG/5ML suspension Take 30 mLs by mouth daily as needed (for constipation).    . Menthol-Zinc Oxide (GOLD BOND EX) Apply 1 application topically daily.    . ondansetron (ZOFRAN) 4 MG tablet Take 4 mg by mouth every 4 (four) hours as needed for nausea or vomiting.    Marland Kitchen oxyCODONE (ROXICODONE) 5 MG immediate release tablet Take 1 tablet (5 mg total) by mouth every 4 (four) hours as needed for severe pain. (Patient not taking: Reported on 04/10/2017) 40 tablet 0  . potassium chloride SA (K-DUR,KLOR-CON) 20 MEQ tablet Take 20 mEq by mouth daily. (0800)    . pyrithione zinc (HEAD AND SHOULDERS) 1 % shampoo Apply 1 application topically daily. t-gel or head and shoulders.    . Skin Protectants, Misc. (EUCERIN) cream Apply topically 2 (two) times daily.     No current facility-administered medications for  this visit.     Review of Systems:  GENERAL:  Feels "alright".  No fevers or sweats.  Weight up 16 pounds. PERFORMANCE STATUS (ECOG):  2 HEENT:  No visual changes, runny nose, sore throat, mouth sores or tenderness. Lungs:  Shortness of breath with exertion.  No cough.  No hemoptysis. Cardiac:  No chest pain, palpitations, orthopnea, or PND. GI: Eating well.  No nausea, vomiting, diarrhea, constipation, or hematochezia. GU:  No urgency, frequency, dysuria, or hematuria. Musculoskeletal:  Right  knee s/p surgery (see HPI).  No back pain.  No muscle tenderness. Extremities:  Pain in lower extremities (see HPI).  No swelling. Skin:  No rashes or ulcers. Neuro:  No focal weakness.  No seizures.  No headache, numbness or weakness, balance or coordination issues. Endocrine:  Diabetes.  No thyroid issues, hot flashes or night sweats. Psych:  Nervous.  No mood changes, depression. Pain:  No focal pain. Review of systems:  All other systems reviewed and found to be negative.  Physical Exam: Blood pressure 110/68, pulse 87, temperature (!) 97.3 F (36.3 C), temperature source Tympanic, resp. rate 18, weight 236 lb (107 kg). GENERAL:  Well developed, well nourished, heavyset woman sitting comfortably in the exam room in no acute distress.  MENTAL STATUS:  Alert and oriented to person, place and time. HEAD:  Short gray hair.  Normocephalic, atraumatic, face symmetric, no Cushingoid features. EYES:  Blue eyes s/p cataract surgery.  Pupils equal round and reactive to light and accomodation.  No conjunctivitis or scleral icterus. ENT:  Oropharynx clear without lesion.  Dentures.  Tongue normal. Mucous membranes dry.  RESPIRATORY:  Clear to auscultation without rales, wheezes or rhonchi. CARDIOVASCULAR:  Regular rate and rhythm without murmur, rub or gallop. ABDOMEN:  Soft, fully round, with active bowel sounds, and no appreciable hepatosplenomegaly.  No guarding or rebound tenderness.  No masses. SKIN:  Prominent abdominal wall veins.  No rashes, ulcers.  Scattered small bruises.  No petechiae. EXTREMITIES:  Chronic bilateral lower extremity edema.  No palpable cords. LYMPH NODES: No palpable cervical, supraclavicular, axillary or inguinal adenopathy  NEUROLOGICAL: Unremarkable. PSYCH:  Appropriate.   [facility] Labs: 09/19/2016:  Hematocrit 39.0, hemoglobin 12.6, MCV 94, platelets are 110,000, white count 9000 with an ANC of 7200. Comprehensive metabolic panel included an alkaline phosphatase of  213. Bilirubin, SGOT and SGPT were normal.  Creatinine was normal. Iron studies included a ferritin of 136, iron saturation of 28% and a TIBC of 310. 04/05/2017:  Hematocrit 39.7, hemoglobin 13.3, MCV 96, platelets are 107,000, white count 5700 with an ANC of 4200. Comprehensive metabolic panel included an alkaline phosphatase of 225. Bilirubin, SGOT and SGPT were normal.  Creatinine was 0.78.  Ferritin was  214. PT was 10.9 with an INR of 1.1.   No visits with results within 3 Day(s) from this visit.  Latest known visit with results is:  Admission on 03/07/2017, Discharged on 03/11/2017  Component Date Value Ref Range Status  . Sodium 03/07/2017 140  135 - 145 mmol/L Final  . Potassium 03/07/2017 3.8  3.5 - 5.1 mmol/L Final  . Chloride 03/07/2017 102  101 - 111 mmol/L Final  . CO2 03/07/2017 29  22 - 32 mmol/L Final  . Glucose, Bld 03/07/2017 100* 65 - 99 mg/dL Final  . BUN 03/07/2017 17  6 - 20 mg/dL Final  . Creatinine, Ser 03/07/2017 0.87  0.44 - 1.00 mg/dL Final  . Calcium 03/07/2017 9.0  8.9 - 10.3 mg/dL Final  .  Total Protein 03/07/2017 7.6  6.5 - 8.1 g/dL Final  . Albumin 03/07/2017 3.7  3.5 - 5.0 g/dL Final  . AST 03/07/2017 35  15 - 41 U/L Final  . ALT 03/07/2017 31  14 - 54 U/L Final  . Alkaline Phosphatase 03/07/2017 259* 38 - 126 U/L Final  . Total Bilirubin 03/07/2017 1.1  0.3 - 1.2 mg/dL Final  . GFR calc non Af Amer 03/07/2017 >60  >60 mL/min Final  . GFR calc Af Amer 03/07/2017 >60  >60 mL/min Final   Comment: (NOTE) The eGFR has been calculated using the CKD EPI equation. This calculation has not been validated in all clinical situations. eGFR's persistently <60 mL/min signify possible Chronic Kidney Disease.   . Anion gap 03/07/2017 9  5 - 15 Final  . WBC 03/07/2017 7.4  3.6 - 11.0 K/uL Final  . RBC 03/07/2017 4.08  3.80 - 5.20 MIL/uL Final  . Hemoglobin 03/07/2017 13.1  12.0 - 16.0 g/dL Final  . HCT 03/07/2017 37.9  35.0 - 47.0 % Final  . MCV 03/07/2017 93.0   80.0 - 100.0 fL Final  . MCH 03/07/2017 32.2  26.0 - 34.0 pg Final  . MCHC 03/07/2017 34.6  32.0 - 36.0 g/dL Final  . RDW 03/07/2017 15.7* 11.5 - 14.5 % Final  . Platelets 03/07/2017 110* 150 - 440 K/uL Final  . Glucose-Capillary 03/07/2017 87  65 - 99 mg/dL Final  . Prothrombin Time 03/07/2017 13.1  11.4 - 15.2 seconds Final  . INR 03/07/2017 1.00   Final  . aPTT 03/07/2017 37* 24 - 36 seconds Final   Comment:        IF BASELINE aPTT IS ELEVATED, SUGGEST PATIENT RISK ASSESSMENT BE USED TO DETERMINE APPROPRIATE ANTICOAGULANT THERAPY.   . Troponin I 03/07/2017 <0.03  <0.03 ng/mL Final  . Color, Urine 03/07/2017 YELLOW* YELLOW Final  . APPearance 03/07/2017 CLEAR* CLEAR Final  . Specific Gravity, Urine 03/07/2017 1.010  1.005 - 1.030 Final  . pH 03/07/2017 5.0  5.0 - 8.0 Final  . Glucose, UA 03/07/2017 NEGATIVE  NEGATIVE mg/dL Final  . Hgb urine dipstick 03/07/2017 NEGATIVE  NEGATIVE Final  . Bilirubin Urine 03/07/2017 NEGATIVE  NEGATIVE Final  . Ketones, ur 03/07/2017 NEGATIVE  NEGATIVE mg/dL Final  . Protein, ur 03/07/2017 NEGATIVE  NEGATIVE mg/dL Final  . Nitrite 03/07/2017 NEGATIVE  NEGATIVE Final  . Leukocytes, UA 03/07/2017 NEGATIVE  NEGATIVE Final  . Glucose-Capillary 03/07/2017 109* 65 - 99 mg/dL Final  . Ammonia 03/07/2017 39* 9 - 35 umol/L Final  . Neutrophils Relative % 03/07/2017 74  % Final  . Neutro Abs 03/07/2017 5.7  1.4 - 6.5 K/uL Final  . Lymphocytes Relative 03/07/2017 13  % Final  . Lymphs Abs 03/07/2017 1.0  1.0 - 3.6 K/uL Final  . Monocytes Relative 03/07/2017 10  % Final  . Monocytes Absolute 03/07/2017 0.7  0.2 - 0.9 K/uL Final  . Eosinophils Relative 03/07/2017 2  % Final  . Eosinophils Absolute 03/07/2017 0.1  0 - 0.7 K/uL Final  . Basophils Relative 03/07/2017 1  % Final  . Basophils Absolute 03/07/2017 0.0  0 - 0.1 K/uL Final  . Sodium 03/08/2017 141  135 - 145 mmol/L Final  . Potassium 03/08/2017 3.9  3.5 - 5.1 mmol/L Final  . Chloride 03/08/2017  104  101 - 111 mmol/L Final  . CO2 03/08/2017 28  22 - 32 mmol/L Final  . Glucose, Bld 03/08/2017 140* 65 - 99 mg/dL Final  . BUN  03/08/2017 15  6 - 20 mg/dL Final  . Creatinine, Ser 03/08/2017 0.76  0.44 - 1.00 mg/dL Final  . Calcium 03/08/2017 8.7* 8.9 - 10.3 mg/dL Final  . GFR calc non Af Amer 03/08/2017 >60  >60 mL/min Final  . GFR calc Af Amer 03/08/2017 >60  >60 mL/min Final   Comment: (NOTE) The eGFR has been calculated using the CKD EPI equation. This calculation has not been validated in all clinical situations. eGFR's persistently <60 mL/min signify possible Chronic Kidney Disease.   . Anion gap 03/08/2017 9  5 - 15 Final  . WBC 03/08/2017 7.1  3.6 - 11.0 K/uL Final  . RBC 03/08/2017 3.95  3.80 - 5.20 MIL/uL Final  . Hemoglobin 03/08/2017 12.5  12.0 - 16.0 g/dL Final  . HCT 03/08/2017 36.7  35.0 - 47.0 % Final  . MCV 03/08/2017 92.8  80.0 - 100.0 fL Final  . MCH 03/08/2017 31.8  26.0 - 34.0 pg Final  . MCHC 03/08/2017 34.2  32.0 - 36.0 g/dL Final  . RDW 03/08/2017 15.9* 11.5 - 14.5 % Final  . Platelets 03/08/2017 104* 150 - 440 K/uL Final  . Tricyclic, Ur Screen 15/72/6203 NONE DETECTED  NONE DETECTED Final  . Amphetamines, Ur Screen 03/07/2017 NONE DETECTED  NONE DETECTED Final  . MDMA (Ecstasy)Ur Screen 03/07/2017 NONE DETECTED  NONE DETECTED Final  . Cocaine Metabolite,Ur [place] 03/07/2017 NONE DETECTED  NONE DETECTED Final  . Opiate, Ur Screen 03/07/2017 NONE DETECTED  NONE DETECTED Final  . Phencyclidine (PCP) Ur S 03/07/2017 NONE DETECTED  NONE DETECTED Final  . Cannabinoid 50 Ng, Ur [place] 03/07/2017 NONE DETECTED  NONE DETECTED Final  . Barbiturates, Ur Screen 03/07/2017 NONE DETECTED  NONE DETECTED Final  . Benzodiazepine, Ur Scrn 03/07/2017 NONE DETECTED  NONE DETECTED Final  . Methadone Scn, Ur 03/07/2017 NONE DETECTED  NONE DETECTED Final   Comment: (NOTE) 559  Tricyclics, urine               Cutoff 1000 ng/mL 200  Amphetamines, urine             Cutoff 1000  ng/mL 300  MDMA (Ecstasy), urine           Cutoff 500 ng/mL 400  Cocaine Metabolite, urine       Cutoff 300 ng/mL 500  Opiate, urine                   Cutoff 300 ng/mL 600  Phencyclidine (PCP), urine      Cutoff 25 ng/mL 700  Cannabinoid, urine              Cutoff 50 ng/mL 800  Barbiturates, urine             Cutoff 200 ng/mL 900  Benzodiazepine, urine           Cutoff 200 ng/mL 1000 Methadone, urine                Cutoff 300 ng/mL 1100 1200 The urine drug screen provides only a preliminary, unconfirmed 1300 analytical test result and should not be used for non-medical 1400 purposes. Clinical consideration and professional judgment should 1500 be applied to any positive drug screen result due to possible 1600 interfering substances. A more specific alternate chemical method 1700 must be used in order to obtain a confirmed analytical result.  1800 Gas chromato  graphy / mass spectrometry (GC/MS) is the preferred 1900 confirmatory method.   Marland Kitchen MRSA by PCR 03/08/2017 NEGATIVE  NEGATIVE Final   Comment:        The GeneXpert MRSA Assay (FDA approved for NASAL specimens only), is one component of a comprehensive MRSA colonization surveillance program. It is not intended to diagnose MRSA infection nor to guide or monitor treatment for MRSA infections.   . Glucose-Capillary 03/08/2017 143* 65 - 99 mg/dL Final  . Comment 1 03/08/2017 Notify RN   Final  . Glucose-Capillary 03/08/2017 130* 65 - 99 mg/dL Final  . Comment 1 03/08/2017 QC Due   Final  . Comment 2 03/08/2017 Notify RN   Final  . Glucose-Capillary 03/08/2017 116* 65 - 99 mg/dL Final  . Comment 1 03/08/2017 Notify RN   Final  . Glucose-Capillary 03/08/2017 333* 65 - 99 mg/dL Final  . Comment 1 03/08/2017 Notify RN   Final  . Glucose-Capillary 03/08/2017 155* 65 - 99 mg/dL Final  . Glucose-Capillary 03/09/2017 124* 65 - 99 mg/dL Final  . Comment 1 03/09/2017 Notify RN   Final  . Glucose-Capillary  03/09/2017 106* 65 - 99 mg/dL Final  . Comment 1 03/09/2017 Notify RN   Final  . Glucose-Capillary 03/09/2017 165* 65 - 99 mg/dL Final  . Comment 1 03/09/2017 Notify RN   Final  . Glucose-Capillary 03/09/2017 147* 65 - 99 mg/dL Final  . Glucose-Capillary 03/10/2017 116* 65 - 99 mg/dL Final  . Glucose-Capillary 03/10/2017 155* 65 - 99 mg/dL Final  . Glucose-Capillary 03/10/2017 144* 65 - 99 mg/dL Final  . Glucose-Capillary 03/10/2017 134* 65 - 99 mg/dL Final  . Glucose-Capillary 03/11/2017 134* 65 - 99 mg/dL Final  . Comment 1 03/11/2017 Notify RN   Final  . Glucose-Capillary 03/11/2017 212* 65 - 99 mg/dL Final  . Comment 1 03/11/2017 Notify RN   Final  . Glucose-Capillary 03/11/2017 136* 65 - 99 mg/dL Final  . Comment 1 03/11/2017 Notify RN   Final    Assessment:  Stephanie [name] is a 66 y.o. 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 Platelets have ranged between 108,000 and 128,000 without trend. She denies any new medications or herbal products.  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  Work-up on 08/29/2014 revealed a positive rheumatoid factor and ANA (anti-double-stranded DNA of 5- equivocal).  Negative studies included hepatitis B surface antibody and antigen, hepatitis C testing, and HIV testing. B12, SPEP, and free light chains were normal.  Additional testing on 10/31/2014 revealed negative hepatitis B core antibody and CMV IgM.  Abdominal ultrasound on 06/27/2014 revealed mild splenomegaly (14.2 cm) with moderate cirrhosis and no focal liver lesions. RUQ ultrasound on 09/29/2016 revealed a nodular liver (cirrhotic) without focal mass.   She is on lactulose 3/day.  AFP was normal (4) on 04/21/2015.  She denies any alcohol use.  She has a history of recurrent iron deficiency anemia.  Ferritin was 20 on 05/11/2015 and 15 on 11/13/2015. Hematocrit was 29.7 on 11/13/2015.  Last colonoscopy was in  08/2014.  Guaiac cards were negative x 3 in 11/2015.  Diet is good.  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  She is s/p arthroscopic right knee surgery on 11/15/2016.  She is s/p left femur fracture on 03/07/2017.  Alkaline phosphatase is elevated.  Symptomatically, she notes increased bruising.  Exam reveals scattered bruises and no petechiae.  Plan: 1.  [facility] labs today: CBC with  diff, CMP, ferritin, AFP, PT, PTT.   2.  Discuss cirrhosis and need for continued follow-up with GI. 3.  Discuss increased bruising.  Labs today to assess.  Discuss no aspirin or ibuprofen. 4.  Limited abdominal ultrasound scheduled for 05/24/2017. 5.  Discuss elevated alkaline phosphatase (liver versus bone).  Etiology confounded by recent femur fracture.  Fractionate alkaline phosphatase with next lab draw. 6.  RTC in 4 months for MD assessment and review of [facility] labs.   Honor Loh, NP  04/10/2017, 2:36 PM   I saw and evaluated the patient, participating in the key portions of the service and reviewing pertinent diagnostic studies and records.  I reviewed the nurse practitioner's note and agree with the findings and the plan.  The assessment and plan were discussed with the patient.  A few questions were asked by the patient and answered.   Nolon Stalls, MD 04/10/2017,2:36 PM

## 2017-04-10 NOTE — Progress Notes (Signed)
Here for follow up. Lives at [facility]. Here w transport staff.

## 2017-04-16 ENCOUNTER — Encounter: Payer: Self-pay | Admitting: Hematology and Oncology

## 2017-04-16 DIAGNOSIS — R748 Abnormal levels of other serum enzymes: Secondary | ICD-10-CM | POA: Insufficient documentation

## 2017-04-17 DIAGNOSIS — M84353A Stress fracture, unspecified femur, initial encounter for fracture: Secondary | ICD-10-CM | POA: Insufficient documentation

## 2017-04-19 ENCOUNTER — Emergency Department
Admission: EM | Admit: 2017-04-19 | Discharge: 2017-04-19 | Disposition: A | Discharge status: Home / Self Care | Payer: Medicare Other | Attending: Emergency Medicine | Admitting: Emergency Medicine

## 2017-04-19 ENCOUNTER — Other Ambulatory Visit: Payer: Self-pay

## 2017-04-19 DIAGNOSIS — G8929 Other chronic pain: Secondary | ICD-10-CM | POA: Insufficient documentation

## 2017-04-19 DIAGNOSIS — Z87891 Personal history of nicotine dependence: Secondary | ICD-10-CM | POA: Insufficient documentation

## 2017-04-19 DIAGNOSIS — Z79899 Other long term (current) drug therapy: Secondary | ICD-10-CM | POA: Insufficient documentation

## 2017-04-19 DIAGNOSIS — Z794 Long term (current) use of insulin: Secondary | ICD-10-CM | POA: Insufficient documentation

## 2017-04-19 DIAGNOSIS — N390 Urinary tract infection, site not specified: Secondary | ICD-10-CM | POA: Diagnosis not present

## 2017-04-19 DIAGNOSIS — E1165 Type 2 diabetes mellitus with hyperglycemia: Secondary | ICD-10-CM | POA: Diagnosis not present

## 2017-04-19 DIAGNOSIS — Z8673 Personal history of transient ischemic attack (TIA), and cerebral infarction without residual deficits: Secondary | ICD-10-CM | POA: Diagnosis not present

## 2017-04-19 DIAGNOSIS — R739 Hyperglycemia, unspecified: Secondary | ICD-10-CM

## 2017-04-19 DIAGNOSIS — J45909 Unspecified asthma, uncomplicated: Secondary | ICD-10-CM | POA: Insufficient documentation

## 2017-04-19 LAB — BASIC METABOLIC PANEL
ANION GAP: 12 (ref 5–15)
BUN: 25 mg/dL — ABNORMAL HIGH (ref 6–20)
CO2: 28 mmol/L (ref 22–32)
Calcium: 9.4 mg/dL (ref 8.9–10.3)
Chloride: 92 mmol/L — ABNORMAL LOW (ref 101–111)
Creatinine, Ser: 0.93 mg/dL (ref 0.44–1.00)
GFR calc Af Amer: >60 mL/min (ref >60)
GLUCOSE: 479 mg/dL — AB (ref 65–99)
POTASSIUM: 4.3 mmol/L (ref 3.5–5.1)
SODIUM: 132 mmol/L — AB (ref 135–145)

## 2017-04-19 LAB — CBC
HCT: 40.9 % (ref 35.0–47.0)
Hemoglobin: 13.5 g/dL (ref 12.0–16.0)
MCH: 31.4 pg (ref 26.0–34.0)
MCHC: 33.1 g/dL (ref 32.0–36.0)
MCV: 94.9 fL (ref 80.0–100.0)
PLATELETS: 137 10*3/uL — AB (ref 150–440)
RBC: 4.3 MIL/uL (ref 3.80–5.20)
RDW: 15.7 % — ABNORMAL HIGH (ref 11.5–14.5)
WBC: 10.2 10*3/uL (ref 3.6–11.0)

## 2017-04-19 LAB — URINALYSIS, COMPLETE (UACMP) WITH MICROSCOPIC
BILIRUBIN URINE: NEGATIVE
HGB URINE DIPSTICK: NEGATIVE
KETONES UR: NEGATIVE mg/dL
NITRITE: NEGATIVE
PH: 6 (ref 5.0–8.0)
PROTEIN: NEGATIVE mg/dL
Specific Gravity, Urine: 1.006 (ref 1.005–1.030)

## 2017-04-19 LAB — GLUCOSE, CAPILLARY
Glucose-Capillary: 466 mg/dL — ABNORMAL HIGH (ref 65–99)
Glucose-Capillary: 320 mg/dL — ABNORMAL HIGH (ref 65–99)

## 2017-04-19 MED ORDER — CEPHALEXIN 500 MG PO CAPS: 500.0000 mg | ORAL_CAPSULE | Freq: Three times a day (TID) | ORAL | 0 refills | Status: AC

## 2017-04-19 MED ORDER — SODIUM CHLORIDE 0.9 % IV BOLUS (SEPSIS)
1000.0000 mL | Freq: Once | INTRAVENOUS | Status: AC
  Administered 2017-04-19: 1000 mL via INTRAVENOUS

## 2017-04-19 MED ORDER — INSULIN ASPART 100 UNIT/ML ~~LOC~~ SOLN
5.0000 [IU] | Freq: Once | SUBCUTANEOUS | Status: AC
  Administered 2017-04-19: 5 [IU] via INTRAVENOUS
  Filled 2017-04-19: qty 1

## 2017-04-19 NOTE — ED Notes (Signed)
FSBS 320  md aware

## 2017-04-19 NOTE — ED Triage Notes (Signed)
Pt c/o CBG>500 for the past 2 days with generalized fatigue. Denies any recent illness or injury.Marland Kitchen

## 2017-04-19 NOTE — ED Notes (Signed)
ED Provider at bedside. 

## 2017-04-19 NOTE — ED Notes (Signed)
Pt ambulates with assistance.   md in with pt again

## 2017-04-19 NOTE — ED Provider Notes (Addendum)
[facility] Emergency Department Provider Note  ____________________________________________   First MD Initiated Contact with Patient 04/19/17 1549     (approximate)  I have reviewed the triage vital signs and the nursing notes.   HISTORY  Chief Complaint Hyperglycemia   HPI Stephanie [name] is a 66 y.o. female with a history of bipolar disorder as well as diabetes who is presenting to the emergency department today with weakness and hyperglycemia over the past 2 days.  The patient says that she has been eating a normal diet for her except for eating one little Debbie Christmas tree cake.  However, she has been given her insulin appropriately prior to arrival and her blood sugars have persistently been elevated to the 400s.  Patient also complaining of some mild burning with urination but otherwise denies any pain.  Past Medical History:  Diagnosis Date  . Adenomatous colon polyp 08/27/2014   Polyps x 3  . Anemia   . Anxiety   . 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 Arthritis   . Asthma   . Bipolar affective disorder ([place])    h/o  . Blood transfusion 2000  . Blood transfusion without reported diagnosis   . Cancer ([place])   . Chronic abdominal pain    abdominal wall pain  . 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 Colon polyp   . Depression   . Diabetes mellitus without complication ([place])   . Fatty liver 04/09/08   found in abd CT  . GERD (gastroesophageal reflux disease)   . H/O: CVA (cardiovascular accident)    TIA  . H/O: rheumatic fever   . Hepatitis    HEP "B" twenty years ago  . Hyperlipidemia   . Incontinence   . Insomnia   . Lower extremity edema   . Obstructive sleep apnea    Use C-PAP twice per week  . Platelets decreased ([place])   . RLS (restless legs syndrome)   . Shortness of breath   . Sleep apnea   . Stroke [facility])    TIA- 2002  . Ulcer   . Unspecified disorders of nervous system     Patient Active Problem List   Diagnosis Date Noted  . Elevated alkaline phosphatase level 04/16/2017  . Altered mental status 03/07/2017  . Degenerative tear of lateral meniscus of right knee 11/15/2016  . Encephalopathy 07/19/2016  . Degenerative tear of medial meniscus of right knee 07/01/2016  . Primary osteoarthritis of right knee 07/01/2016  . Diabetes mellitus type 1, uncontrolled, without complications ([place]) 85/27/7824  . Lymphedema 06/14/2016  . Pain in limb 05/06/2016  . Swelling of limb 05/06/2016  . Postphlebitic syndrome with inflammation 05/06/2016  . Pneumonia 04/18/2016  . Chest pain 04/04/2016  . Iron deficiency anemia 01/30/2016  . Bilateral carotid artery stenosis 01/12/2016  . Hypoglycemia 09/09/2015  . Hyperglycemia 07/16/2015  . Neuropathy 06/08/2015  . Thrombocytopenia ([name]) 08/19/2014  . Abdominal pain, chronic, epigastric 06/12/2014  . Liver cirrhosis secondary to NASH ([place]) 02/27/2014  . Dizziness and giddiness 10/30/2013  . DOE (dyspnea on exertion) 10/30/2013  . Poorly controlled type 2 diabetes mellitus with complication ([place]) 23/53/6144  . DNR (do not resuscitate) 07/29/2013  . Encounter for routine gynecological examination 07/29/2013  . Dyspnea 07/10/2013  . Lung nodule 07/10/2013  . Varicose veins of left lower extremity 10/09/2012  . GERD (gastroesophageal reflux disease) 10/09/2012  . Cirrhosis of liver ([place]) 02/02/2012  . Anxiety 07/11/2011  .  Aortic dissection ([place])   . BACK PAIN, LUMBAR, CHRONIC 11/19/2009  . UNS ADVRS EFF OTH RX MEDICINAL&BIOLOGICAL SBSTNC 12/16/2008  . UNSPECIFIED VITAMIN D DEFICIENCY 09/26/2008  . TOBACCO ABUSE 06/19/2008  . RESTLESS LEGS SYNDROME 04/27/2007  . INSOMNIA 04/27/2007  . HLD (hyperlipidemia) 03/08/2007  . OSA (obstructive sleep apnea) 03/08/2007  . Essential hypertension 03/08/2007  . Asthma 03/08/2007  . BIPOLAR AFFECTIVE DISORDER, HX OF 03/08/2007    Past Surgical History:  Procedure Laterality Date  . ABDOMINAL EXPLORATION  SURGERY    . ABDOMINAL HYSTERECTOMY     total  . Axillary artery cannulation via 8-mm Hemashield graft, median sternotomy, extracorporeal circulation with deep hypothermic circulatory arrest, repair of  aortic dessection  01/23/2009   Dr Arlyce Dice  . BACK SURGERY      x 5  . cataract     both eyes  . CHOLECYSTECTOMY    . COLONOSCOPY    . EYE SURGERY Bilateral    Cataract Extraction with IOL  . HEMORROIDECTOMY     and colon polyp removed  . LUMBAR FUSION  10/09  . POSTERIOR CERVICAL FUSION/FORAMINOTOMY    . ROTATOR CUFF REPAIR     left    Prior to Admission medications   Medication Sig Start Date End Date Taking? Authorizing Provider  albuterol (PROVENTIL HFA;VENTOLIN HFA) 108 (90 Base) MCG/ACT inhaler Inhale 2 puffs into the lungs every 6 (six) hours as needed for wheezing or shortness of breath. 07/28/16   Alfred Levins, [place], MD  albuterol (PROVENTIL) (2.5 MG/3ML) 0.083% nebulizer solution Take 2.5 mg by nebulization every 6 (six) hours as needed for wheezing or shortness of breath.    [provider]  ALPRAZolam Duanne Moron) 0.25 MG tablet Take 1 tablet (0.25 mg total) by mouth 3 (three) times daily as needed for anxiety. Patient not taking: Reported on 04/10/2017 03/11/17   Henreitta Leber, MD  alum & mag hydroxide-simeth (ANTACID) 200-200-20 MG/5ML suspension Take 30 mLs by mouth every 4 (four) hours as needed for indigestion or heartburn.    [provider]  amoxicillin-clavulanate (AUGMENTIN) 600-42.9 MG/5ML suspension  03/28/17   [provider]  aspirin 325 MG tablet Take 325 mg by mouth daily.     [provider]  atorvastatin (LIPITOR) 40 MG tablet  03/07/17   [provider]  bacitracin-polymyxin b (POLYSPORIN) ointment Apply topically 3 (three) times daily. Apply to left earlobe    [provider]  barrier cream (NON-SPECIFIED) CREA Apply 1 application topically as needed (APPLIED AFTER TOILETING TO PREVENT SKIN BREAKDOWN).     [provider]  budesonide-formoterol (SYMBICORT) 160-4.5 MCG/ACT inhaler Inhale 2 puffs into the lungs 2 (two) times daily. (0800 & 2000)    [provider]  buPROPion (WELLBUTRIN SR) 150 MG 12 hr tablet Take 150 mg by mouth daily. (0800)    [provider]  clonazePAM (KLONOPIN) 0.5 MG tablet Take by mouth.    [provider]  diclofenac sodium (VOLTAREN) 1 % GEL Apply topically. 02/02/17   [provider]  dicyclomine (BENTYL) 10 MG capsule Take 10 mg by mouth 3 (three) times daily. (0800, 1300 & 2000)    [provider]  diphenhydrAMINE (DIPHENHIST) 25 mg capsule Take 25 mg by mouth every 6 (six) hours as needed (for allergic reaction (notify provider is symptoms persist more than 24 hrs*).    [provider]  Emollient (EUCERIN INTENSIVE REPAIR) LOTN Apply 1 application topically 2 (two) times daily.    [provider]  ferrous sulfate 325 (65 FE) MG tablet Take 325 mg by mouth 2 (two) times daily. (0800 & 2000)    [provider]  furosemide (LASIX) 80 MG tablet Take 80 mg by mouth daily. (0800)    [provider]  gabapentin (NEURONTIN) 300 MG capsule Take 900 mg by mouth at bedtime.    [provider]  gabapentin (NEURONTIN) 600 MG tablet Take 600 mg by mouth daily. Take 600 mg by mouth in the morning and 900 mg by mouth at bedtime.    [provider]  guaifenesin (ROBITUSSIN) 100 MG/5ML syrup Take 300 mg by mouth every 6 (six) hours as needed for cough.     [provider]  insulin glargine (LANTUS) 100 UNIT/ML injection Inject 28 Units into the skin at bedtime.    [provider]  insulin lispro (HUMALOG KWIKPEN) 100 UNIT/ML KiwkPen Inject 0-12 Units into the skin 3 (three) times daily. Check FSBS before each meal & cover with SSI (sliding scale insulin)     [provider]  lactulose (CHRONULAC) 10 GM/15ML solution Take 10 g by mouth every Monday, Wednesday,  and Friday.  10/17/16   [provider]  lamoTRIgine (LAMICTAL) 100 MG tablet Take 100 mg by mouth 2 (two) times daily. (0800 & 2000)    [provider]  lidocaine (LIDODERM) 5 % Place 3 patches onto the skin daily. Apply to back 02/01/17   [provider]  loperamide (IMODIUM) 2 MG capsule Take 4 mg by mouth as needed for diarrhea or loose stools. *Do not exceed 8 doses in 24 hours*    [provider]  LYRICA 150 MG capsule Take 150 mg by mouth 3 (three) times daily. 11/29/16   [provider]  magnesium hydroxide (MILK OF MAGNESIA) 400 MG/5ML suspension Take 30 mLs by mouth daily as needed (for constipation).    [provider]  Menthol-Zinc Oxide (GOLD BOND EX) Apply 1 application topically daily.    [provider]  metoCLOPramide (REGLAN) 5 MG tablet Take 5 mg by mouth 4 (four) times daily -  before meals and at bedtime. (0800, 1200, 1600 & 2000)    [provider]  mirtazapine (REMERON) 45 MG tablet Take 45 mg by mouth at bedtime. (2000)     [provider]  omeprazole (PRILOSEC) 20 MG capsule Take by mouth. 04/16/14   [provider]  ondansetron (ZOFRAN) 4 MG tablet Take 4 mg by mouth every 4 (four) hours as needed for nausea or vomiting.    [provider]  oxybutynin (DITROPAN-XL) 5 MG 24 hr tablet Take 5 mg by mouth daily. (0800)    [provider]  oxyCODONE (ROXICODONE) 5 MG immediate release tablet Take 1 tablet (5 mg total) by mouth every 4 (four) hours as needed for severe pain. Patient not taking: Reported on 04/10/2017 03/11/17   Henreitta Leber, MD  oxyCODONE-acetaminophen (PERCOCET) 7.5-325 MG tablet  03/28/17   [provider]  potassium chloride SA (K-DUR,KLOR-CON) 20 MEQ tablet Take 20 mEq by mouth daily. (0800)    [provider]  pyrithione zinc (HEAD AND SHOULDERS) 1 % shampoo Apply 1 application topically daily. t-gel or head and shoulders.    [provider]  rOPINIRole (REQUIP) 3 MG tablet Take 3 mg by mouth at bedtime. (2000)    [provider]  simvastatin (ZOCOR) 10 MG tablet Take 10 mg by mouth at bedtime. (2000)    [provider]  Skin  Protectants, Misc. (EUCERIN) cream Apply topically 2 (two) times daily.    [provider]  spironolactone (ALDACTONE) 25 MG tablet Take 25 mg by mouth daily. (0800)    [provider]  vitamin C (ASCORBIC ACID) 500 MG tablet Take 500 mg by mouth 2 (two) times daily. (0800 & 2000)    [provider]  Xylitol (XYLIMELTS) 500 MG DISK Use as directed 1 capsule in the mouth or throat 3 (three) times daily.    [provider]  zolpidem (AMBIEN) 5 MG tablet Take 5 mg by mouth at bedtime. (2000)    [provider]    Allergies Doxycycline  Family History  Problem Relation Age of Onset  . Breast cancer Mother   . Lung cancer Mother   . Stomach cancer Father   . Diabetes Unknown        4 aunts, and 1 uncle  . Colon cancer Neg Hx   . Esophageal cancer Neg Hx   . Rectal cancer Neg Hx     Social History Social History   Tobacco Use  . Smoking status: Former Smoker    Packs/day: 1.50    Years: 50.00    Pack years: 75.00    Types: Cigarettes    Last attempt to quit: 04/06/2013    Years since quitting: 4.0  . Smokeless tobacco: Never Used  Substance Use Topics  . Alcohol use: No    Alcohol/week: 0.0 oz  . Drug use: No    Review of Systems  Constitutional: No fever/chills Eyes: No visual changes. ENT: No sore throat. Cardiovascular: Denies chest pain. Respiratory: Denies shortness of breath. Gastrointestinal: No abdominal pain.  No nausea, no vomiting.  No diarrhea.  No constipation. Genitourinary: As above Musculoskeletal: Negative for back pain. Skin: Negative for rash. Neurological: Negative for headaches, focal weakness or numbness.   ____________________________________________   PHYSICAL EXAM:  VITAL  SIGNS: ED Triage Vitals  Enc Vitals Group     BP 04/19/17 1458 118/64     Pulse Rate 04/19/17 1458 83     Resp 04/19/17 1458 17     Temp 04/19/17 1458 98 F (36.7 C)     Temp Source 04/19/17 1458 Oral     SpO2 04/19/17 1458 97 %     Weight 04/19/17 1459 220 lb (99.8 kg)     Height 04/19/17 1459 5' 3"  (1.6 m)     Head Circumference --      Peak Flow --      Pain Score 04/19/17 1458 0     Pain Loc --      Pain Edu? --      Excl. in [place]? --     Constitutional: Alert and oriented. Well appearing and in no acute distress. Eyes: Conjunctivae are normal.  Head: Atraumatic. Nose: No congestion/rhinnorhea. Mouth/Throat: Mucous membranes are dry Neck: No stridor.   Cardiovascular: Normal rate, regular rhythm. Grossly normal heart sounds.  Respiratory: Normal respiratory effort.  No retractions. Lungs CTAB. Gastrointestinal: Soft and nontender. No distention.  Musculoskeletal: No lower extremity tenderness nor edema.  No joint effusions. Neurologic:  Normal speech and language. No gross focal neurologic deficits are appreciated. Skin:  Skin is warm, dry and intact. No rash noted. Psychiatric: Mood and affect are normal. Speech and behavior are normal.  ____________________________________________   LABS (all labs ordered are listed, but only abnormal results are displayed)  Labs Reviewed  BASIC METABOLIC PANEL - Abnormal; Notable for the following components:  Result Value   Sodium 132 (*)    Chloride 92 (*)    Glucose, Bld 479 (*)    BUN 25 (*)    All other components within normal limits  CBC - Abnormal; Notable for the following components:   RDW 15.7 (*)    Platelets 137 (*)    All other components within normal limits  URINALYSIS, COMPLETE (UACMP) WITH MICROSCOPIC - Abnormal; Notable for the following components:   Color, Urine STRAW (*)    APPearance HAZY (*)    Glucose, UA >=500 (*)    Leukocytes, UA SMALL (*)    Bacteria, UA RARE (*)    Squamous Epithelial /  LPF 6-30 (*)    All other components within normal limits  GLUCOSE, CAPILLARY - Abnormal; Notable for the following components:   Glucose-Capillary 466 (*)    All other components within normal limits  CBG MONITORING, ED   ____________________________________________  EKG   ____________________________________________  RADIOLOGY   ____________________________________________   PROCEDURES  Procedure(s) performed:   Procedures  Critical Care performed:   ____________________________________________   INITIAL IMPRESSION / ASSESSMENT AND PLAN / ED COURSE  Pertinent labs & imaging results that were available during my care of the patient were reviewed by me and considered in my medical decision making (see chart for details).  DDX: Hyperglycemia, UTI, DKA, weakness  As part of my medical decision making, I reviewed the following data within the [place] Notes from prior ED visits  ----------------------------------------- 6:13 PM on 04/19/2017 -----------------------------------------  Patient at this time is able to ambulate.  Says that she is feeling improved.  Glucose now down to 320.  Possible UTI.  Patient will be discharged with Keflex.  Patient is understanding of this plan and willing to comply.  Unclear of exact cause of the patient's hyperglycemia but it could be attributed to the UTI or eating sweets.  Patient nontoxic-appearing.  Not in DKA.     ____________________________________________   FINAL CLINICAL IMPRESSION(S) / ED DIAGNOSES  hyper glycemia.  UTI.    NEW MEDICATIONS STARTED DURING THIS VISIT:  This SmartLink is deprecated. Use AVSMEDLIST instead to display the medication list for a patient.   Note:  This document was prepared using Dragon voice recognition software and may include unintentional dictation errors.     Orbie Pyo, MD 04/19/17 1814    Orbie Pyo, MD 04/19/17 [phone]

## 2017-04-19 NOTE — ED Notes (Signed)
Pt sleeping eaisly aroused.

## 2017-04-21 LAB — URINE CULTURE: Culture: >=100000 — AB

## 2017-04-23 ENCOUNTER — Emergency Department
Admission: EM | Admit: 2017-04-23 | Discharge: 2017-04-23 | Disposition: A | Discharge status: Other Acute Inpatient Hospital | Payer: Medicare Other | Attending: Emergency Medicine | Admitting: Emergency Medicine

## 2017-04-23 ENCOUNTER — Emergency Department: Payer: Medicare Other

## 2017-04-23 DIAGNOSIS — G44309 Post-traumatic headache, unspecified, not intractable: Secondary | ICD-10-CM | POA: Diagnosis not present

## 2017-04-23 DIAGNOSIS — Y999 Unspecified external cause status: Secondary | ICD-10-CM | POA: Insufficient documentation

## 2017-04-23 DIAGNOSIS — J45909 Unspecified asthma, uncomplicated: Secondary | ICD-10-CM | POA: Insufficient documentation

## 2017-04-23 DIAGNOSIS — R200 Anesthesia of skin: Secondary | ICD-10-CM | POA: Insufficient documentation

## 2017-04-23 DIAGNOSIS — E109 Type 1 diabetes mellitus without complications: Secondary | ICD-10-CM | POA: Insufficient documentation

## 2017-04-23 DIAGNOSIS — Z7982 Long term (current) use of aspirin: Secondary | ICD-10-CM | POA: Diagnosis not present

## 2017-04-23 DIAGNOSIS — Z8673 Personal history of transient ischemic attack (TIA), and cerebral infarction without residual deficits: Secondary | ICD-10-CM | POA: Insufficient documentation

## 2017-04-23 DIAGNOSIS — Z794 Long term (current) use of insulin: Secondary | ICD-10-CM | POA: Insufficient documentation

## 2017-04-23 DIAGNOSIS — Z23 Encounter for immunization: Secondary | ICD-10-CM | POA: Diagnosis not present

## 2017-04-23 DIAGNOSIS — I10 Essential (primary) hypertension: Secondary | ICD-10-CM | POA: Diagnosis not present

## 2017-04-23 DIAGNOSIS — Y92003 Bedroom of unspecified non-institutional (private) residence as the place of occurrence of the external cause: Secondary | ICD-10-CM | POA: Insufficient documentation

## 2017-04-23 DIAGNOSIS — M542 Cervicalgia: Secondary | ICD-10-CM | POA: Diagnosis not present

## 2017-04-23 DIAGNOSIS — W06XXXA Fall from bed, initial encounter: Secondary | ICD-10-CM | POA: Insufficient documentation

## 2017-04-23 DIAGNOSIS — W19XXXA Unspecified fall, initial encounter: Secondary | ICD-10-CM

## 2017-04-23 DIAGNOSIS — S0990XA Unspecified injury of head, initial encounter: Secondary | ICD-10-CM | POA: Diagnosis present

## 2017-04-23 DIAGNOSIS — G8929 Other chronic pain: Secondary | ICD-10-CM | POA: Insufficient documentation

## 2017-04-23 DIAGNOSIS — Z79891 Long term (current) use of opiate analgesic: Secondary | ICD-10-CM | POA: Insufficient documentation

## 2017-04-23 DIAGNOSIS — Z87891 Personal history of nicotine dependence: Secondary | ICD-10-CM | POA: Insufficient documentation

## 2017-04-23 DIAGNOSIS — Y939 Activity, unspecified: Secondary | ICD-10-CM | POA: Diagnosis not present

## 2017-04-23 DIAGNOSIS — Z79899 Other long term (current) drug therapy: Secondary | ICD-10-CM | POA: Insufficient documentation

## 2017-04-23 DIAGNOSIS — S0101XA Laceration without foreign body of scalp, initial encounter: Secondary | ICD-10-CM | POA: Diagnosis not present

## 2017-04-23 LAB — COMPREHENSIVE METABOLIC PANEL
ALK PHOS: 180 U/L — AB (ref 38–126)
ALT: 27 U/L (ref 14–54)
AST: 26 U/L (ref 15–41)
Albumin: 3.2 g/dL — ABNORMAL LOW (ref 3.5–5.0)
Anion gap: 8 (ref 5–15)
BILIRUBIN TOTAL: 0.7 mg/dL (ref 0.3–1.2)
BUN: 20 mg/dL (ref 6–20)
CALCIUM: 8.5 mg/dL — AB (ref 8.9–10.3)
CHLORIDE: 100 mmol/L — AB (ref 101–111)
CO2: 28 mmol/L (ref 22–32)
CREATININE: 0.76 mg/dL (ref 0.44–1.00)
Glucose, Bld: 222 mg/dL — ABNORMAL HIGH (ref 65–99)
Potassium: 3.6 mmol/L (ref 3.5–5.1)
Sodium: 136 mmol/L (ref 135–145)
TOTAL PROTEIN: 6.6 g/dL (ref 6.5–8.1)

## 2017-04-23 LAB — CBC WITH DIFFERENTIAL/PLATELET
BASOS ABS: 0 10*3/uL (ref 0–0.1)
Basophils Relative: 0 %
EOS PCT: 1 %
Eosinophils Absolute: 0.1 10*3/uL (ref 0–0.7)
HEMATOCRIT: 41.7 % (ref 35.0–47.0)
Hemoglobin: 14 g/dL (ref 12.0–16.0)
LYMPHS ABS: 0.9 10*3/uL — AB (ref 1.0–3.6)
LYMPHS PCT: 11 %
MCH: 31.7 pg (ref 26.0–34.0)
MCHC: 33.5 g/dL (ref 32.0–36.0)
MCV: 94.7 fL (ref 80.0–100.0)
Monocytes Absolute: 0.5 10*3/uL (ref 0.2–0.9)
Monocytes Relative: 7 %
NEUTROS ABS: 6.7 10*3/uL — AB (ref 1.4–6.5)
Neutrophils Relative %: 81 %
PLATELETS: 104 10*3/uL — AB (ref 150–440)
RBC: 4.4 MIL/uL (ref 3.80–5.20)
RDW: 15.6 % — ABNORMAL HIGH (ref 11.5–14.5)
WBC: 8.2 10*3/uL (ref 3.6–11.0)

## 2017-04-23 MED ORDER — MORPHINE SULFATE (PF) 4 MG/ML IV SOLN
INTRAVENOUS | Status: AC
  Filled 2017-04-23: qty 1

## 2017-04-23 MED ORDER — MORPHINE SULFATE (PF) 4 MG/ML IV SOLN
4.0000 mg | Freq: Once | INTRAVENOUS | Status: AC
  Administered 2017-04-23: 4 mg via INTRAVENOUS

## 2017-04-23 MED ORDER — ONDANSETRON HCL 4 MG/2ML IJ SOLN
INTRAMUSCULAR | Status: AC
  Filled 2017-04-23: qty 2

## 2017-04-23 MED ORDER — ONDANSETRON HCL 4 MG/2ML IJ SOLN
4.0000 mg | Freq: Once | INTRAMUSCULAR | Status: AC
  Administered 2017-04-23: 4 mg via INTRAVENOUS

## 2017-04-23 MED ORDER — TETANUS-DIPHTH-ACELL PERTUSSIS 5-2.5-18.5 LF-MCG/0.5 IM SUSP
0.5000 mL | Freq: Once | INTRAMUSCULAR | Status: AC
  Administered 2017-04-23: 0.5 mL via INTRAMUSCULAR
  Filled 2017-04-23: qty 0.5

## 2017-04-23 NOTE — ED Notes (Signed)
Patient informed this RN that she could did not feel her tetanus shot. RN assessed sensation of left arm - patient has decreased sensation to left arm; equil grip strength bilaterally, MD Malinda informed.  This RN and MD Cinda Quest accompanied patient to CT to assist with movement/stabilization

## 2017-04-23 NOTE — ED Notes (Signed)
[facility] for transfer [phone]

## 2017-04-23 NOTE — ED Provider Notes (Addendum)
Signout from Dr. Rip Harbour in this 66 year old female who rolled out of bed and hit her head on a nightstand.  In addition to a laceration to the left parietal region she is also complaining of left upper extremity weakness and numbness and also some difficulty moving her right upper extremity.  Patient was initially maintained in a position of comfort with her head and slight rightward rotation with towel rolls and bandages, but then was able to be placed in a cervical collar after she moved her head herself to the midline.  Physical Exam  BP (!) 145/78   Pulse 87   Temp 98.1 F (36.7 C) (Oral)   Resp 14   Wt 99.8 kg (220 lb)   SpO2 96%   BMI 38.97 kg/m  ----------------------------------------- 8:14 AM on 04/23/2017 -----------------------------------------   Physical Exam Patient with a 1 cm laceration to the parietal region without active bleeding.  Edges are well approximated.  Mild tenderness and hematoma but without depression.  Neurologic exam with the patient having 2 out of 5 strength to the left upper extremity from the forearm on proximal to the shoulder.  She also has decreased sensation to light touch.  Week 4 out of 5 strength of the right upper extremity with sensation that is intact.  Left upper extremity with 5 out of 5 strength distally to the hand.  ED Course  Procedures  MDM Reassuring CAT scans here at [facility] without any acute pathology found.  However, patient with possible central cord syndrome.  Discussed the case with Dr.McLane from [facility] emergency department who accepts the patient has an ED to ED transfer.  The transfer center had initially called neurosurgery but had recommended an ED to ED transfer.  Patient had her surgery about 11 years ago at [facility].       Orbie Pyo, MD 04/23/17 [phone] ED ECG REPORT I, Doran Stabler, the attending physician, personally viewed and interpreted this ECG.   Date: 04/23/2017  EKG Time:  0831  Rate: 80  Rhythm: normal sinus rhythm  Axis: Normal  Intervals:none  ST&T Change: No ST segment elevation or depression.  No abnormal T wave inversion.    Orbie Pyo, MD 04/23/17 [phone]

## 2017-04-23 NOTE — ED Triage Notes (Signed)
Per EMS: Patient fell out of bed, hit head on nightstand. Patient has laceration on left face.   Patient c/o headache, neck and back pain.   MD at bedside.

## 2017-04-23 NOTE — ED Notes (Signed)
EMTALA documentation reviewed by this RN.

## 2017-04-23 NOTE — ED Notes (Signed)
Pt repositioned, blood cleaned off pts face, gown placed on pt (permission to cut and throw away pts clothing). Pt resting comfortably at this time.

## 2017-04-23 NOTE — ED Provider Notes (Signed)
[facility] Emergency Department Provider Note   ____________________________________________   First MD Initiated Contact with Patient 04/23/17 0631     (approximate)  I have reviewed the triage vital signs and the nursing notes.   HISTORY  Chief Complaint Fall    HPI Stephanie [name] is a 66 y.o. female Patient reports she rolled out of bed hitting her head on the nightstand. She complains of a headache, neck pain  from the back through the front, pain in the right shoulder and pain in the upper back. She is not short of breath. She thinks she might of passed out. She also says she is not allergic to morphine when asked she is sure she is not even on the paperwork so she is. sHe says she's had morphine before. the pain is bad and achy.it started after she fell.   Past Medical History:  Diagnosis Date  . Adenomatous colon polyp 08/27/2014   Polyps x 3  . Anemia   . Anxiety   . 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 Arthritis   . Asthma   . Bipolar affective disorder ([place])    h/o  . Blood transfusion 2000  . Blood transfusion without reported diagnosis   . Cancer ([place])   . Chronic abdominal pain    abdominal wall pain  . 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 Colon polyp   . Depression   . Diabetes mellitus without complication ([place])   . Fatty liver 04/09/08   found in abd CT  . GERD (gastroesophageal reflux disease)   . H/O: CVA (cardiovascular accident)    TIA  . H/O: rheumatic fever   . Hepatitis    HEP "B" twenty years ago  . Hyperlipidemia   . Incontinence   . Insomnia   . Lower extremity edema   . Obstructive sleep apnea    Use C-PAP twice per week  . Platelets decreased ([place])   . RLS (restless legs syndrome)   . Shortness of breath   . Sleep apnea   . Stroke [facility])    TIA- 2002  . Ulcer   . Unspecified disorders of nervous system     Patient Active Problem List   Diagnosis Date Noted  . Elevated alkaline phosphatase  level 04/16/2017  . Altered mental status 03/07/2017  . Degenerative tear of lateral meniscus of right knee 11/15/2016  . Encephalopathy 07/19/2016  . Degenerative tear of medial meniscus of right knee 07/01/2016  . Primary osteoarthritis of right knee 07/01/2016  . Diabetes mellitus type 1, uncontrolled, without complications ([place]) 47/02/6282  . Lymphedema 06/14/2016  . Pain in limb 05/06/2016  . Swelling of limb 05/06/2016  . Postphlebitic syndrome with inflammation 05/06/2016  . Pneumonia 04/18/2016  . Chest pain 04/04/2016  . Iron deficiency anemia 01/30/2016  . Bilateral carotid artery stenosis 01/12/2016  . Hypoglycemia 09/09/2015  . Hyperglycemia 07/16/2015  . Neuropathy 06/08/2015  . Thrombocytopenia ([place]) 08/19/2014  . Abdominal pain, chronic, epigastric 06/12/2014  . Liver cirrhosis secondary to NASH ([place]) 02/27/2014  . Dizziness and giddiness 10/30/2013  . DOE (dyspnea on exertion) 10/30/2013  . Poorly controlled type 2 diabetes mellitus with complication ([place]) 66/29/4765  . DNR (do not resuscitate) 07/29/2013  . Encounter for routine gynecological examination 07/29/2013  . Dyspnea 07/10/2013  . Lung nodule 07/10/2013  . Varicose veins of left lower extremity 10/09/2012  . GERD (gastroesophageal reflux disease) 10/09/2012  . Cirrhosis of liver (  [place]) 02/02/2012  . Anxiety 07/11/2011  . Aortic dissection ([place])   . BACK PAIN, LUMBAR, CHRONIC 11/19/2009  . UNS ADVRS EFF OTH RX MEDICINAL&BIOLOGICAL SBSTNC 12/16/2008  . UNSPECIFIED VITAMIN D DEFICIENCY 09/26/2008  . TOBACCO ABUSE 06/19/2008  . RESTLESS LEGS SYNDROME 04/27/2007  . INSOMNIA 04/27/2007  . HLD (hyperlipidemia) 03/08/2007  . OSA (obstructive sleep apnea) 03/08/2007  . Essential hypertension 03/08/2007  . Asthma 03/08/2007  . BIPOLAR AFFECTIVE DISORDER, HX OF 03/08/2007    Past Surgical History:  Procedure Laterality Date  . ABDOMINAL EXPLORATION SURGERY    . ABDOMINAL HYSTERECTOMY     total  .  Axillary artery cannulation via 8-mm Hemashield graft, median sternotomy, extracorporeal circulation with deep hypothermic circulatory arrest, repair of  aortic dessection  01/23/2009   Dr Arlyce Dice  . BACK SURGERY      x 5  . cataract     both eyes  . CHOLECYSTECTOMY    . COLONOSCOPY    . EYE SURGERY Bilateral    Cataract Extraction with IOL  . HEMORROIDECTOMY     and colon polyp removed  . KNEE ARTHROSCOPY WITH LATERAL RELEASE Right 11/15/2016   Performed by Corky Mull, MD at [facility] ORS  . LIVER BIOPSY N/A 04/10/2012   Performed by Inda Castle, MD at [facility]  . LUMBAR FUSION  10/09  . LUMBAR WOUND DEBRIDEMENT N/A 05/09/2011   Performed by Melina Schools, MD at [facility]  . POSTERIOR CERVICAL FUSION/FORAMINOTOMY    . ROTATOR CUFF REPAIR     left    Prior to Admission medications   Medication Sig Start Date End Date Taking? Authorizing Provider  albuterol (PROVENTIL HFA;VENTOLIN HFA) 108 (90 Base) MCG/ACT inhaler Inhale 2 puffs into the lungs every 6 (six) hours as needed for wheezing or shortness of breath. 07/28/16   Alfred Levins, [place], MD  albuterol (PROVENTIL) (2.5 MG/3ML) 0.083% nebulizer solution Take 2.5 mg by nebulization every 6 (six) hours as needed for wheezing or shortness of breath.    [provider]  ALPRAZolam Duanne Moron) 0.25 MG tablet Take 1 tablet (0.25 mg total) by mouth 3 (three) times daily as needed for anxiety. Patient not taking: Reported on 04/10/2017 03/11/17   Henreitta Leber, MD  alum & mag hydroxide-simeth (ANTACID) 200-200-20 MG/5ML suspension Take 30 mLs by mouth every 4 (four) hours as needed for indigestion or heartburn.    [provider]  amoxicillin-clavulanate (AUGMENTIN) 600-42.9 MG/5ML suspension  03/28/17   [provider]  aspirin 325 MG tablet Take 325 mg by mouth daily.     [provider]  atorvastatin (LIPITOR) 40 MG tablet  03/07/17   [provider]  bacitracin-polymyxin b (POLYSPORIN) ointment  Apply topically 3 (three) times daily. Apply to left earlobe    [provider]  barrier cream (NON-SPECIFIED) CREA Apply 1 application topically as needed (APPLIED AFTER TOILETING TO PREVENT SKIN BREAKDOWN).    [provider]  budesonide-formoterol (SYMBICORT) 160-4.5 MCG/ACT inhaler Inhale 2 puffs into the lungs 2 (two) times daily. (0800 & 2000)    [provider]  buPROPion (WELLBUTRIN SR) 150 MG 12 hr tablet Take 150 mg by mouth daily. (0800)    [provider]  cephALEXin (KEFLEX) 500 MG capsule Take 1 capsule (500 mg total) 3 (three) times daily for 10 days by mouth. 04/19/17 04/29/17  Schaevitz, Randall An, MD  clonazePAM (KLONOPIN) 0.5 MG tablet Take by mouth.    [provider]  diclofenac sodium (VOLTAREN) 1 %  GEL Apply topically. 02/02/17   [provider]  dicyclomine (BENTYL) 10 MG capsule Take 10 mg by mouth 3 (three) times daily. (0800, 1300 & 2000)    [provider]  diphenhydrAMINE (DIPHENHIST) 25 mg capsule Take 25 mg by mouth every 6 (six) hours as needed (for allergic reaction (notify provider is symptoms persist more than 24 hrs*).    [provider]  Emollient (EUCERIN INTENSIVE REPAIR) LOTN Apply 1 application topically 2 (two) times daily.    [provider]  ferrous sulfate 325 (65 FE) MG tablet Take 325 mg by mouth 2 (two) times daily. (0800 & 2000)    [provider]  furosemide (LASIX) 80 MG tablet Take 80 mg by mouth daily. (0800)    [provider]  gabapentin (NEURONTIN) 300 MG capsule Take 900 mg by mouth at bedtime.    [provider]  gabapentin (NEURONTIN) 600 MG tablet Take 600 mg by mouth daily. Take 600 mg by mouth in the morning and 900 mg by mouth at bedtime.    [provider]  guaifenesin (ROBITUSSIN) 100 MG/5ML syrup Take 300 mg by mouth every 6 (six) hours as needed for cough.     [provider]  insulin glargine (LANTUS) 100  UNIT/ML injection Inject 28 Units into the skin at bedtime.    [provider]  insulin lispro (HUMALOG KWIKPEN) 100 UNIT/ML KiwkPen Inject 0-12 Units into the skin 3 (three) times daily. Check FSBS before each meal & cover with SSI (sliding scale insulin)     [provider]  lactulose (CHRONULAC) 10 GM/15ML solution Take 10 g by mouth every Monday, Wednesday, and Friday.  10/17/16   [provider]  lamoTRIgine (LAMICTAL) 100 MG tablet Take 100 mg by mouth 2 (two) times daily. (0800 & 2000)    [provider]  lidocaine (LIDODERM) 5 % Place 3 patches onto the skin daily. Apply to back 02/01/17   [provider]  loperamide (IMODIUM) 2 MG capsule Take 4 mg by mouth as needed for diarrhea or loose stools. *Do not exceed 8 doses in 24 hours*    [provider]  LYRICA 150 MG capsule Take 150 mg by mouth 3 (three) times daily. 11/29/16   [provider]  magnesium hydroxide (MILK OF MAGNESIA) 400 MG/5ML suspension Take 30 mLs by mouth daily as needed (for constipation).    [provider]  Menthol-Zinc Oxide (GOLD BOND EX) Apply 1 application topically daily.    [provider]  metoCLOPramide (REGLAN) 5 MG tablet Take 5 mg by mouth 4 (four) times daily -  before meals and at bedtime. (0800, 1200, 1600 & 2000)    [provider]  mirtazapine (REMERON) 45 MG tablet Take 45 mg by mouth at bedtime. (2000)     [provider]  omeprazole (PRILOSEC) 20 MG capsule Take by mouth. 04/16/14   [provider]  ondansetron (ZOFRAN) 4 MG tablet Take 4 mg by mouth every 4 (four) hours as needed for nausea or vomiting.    [provider]  oxybutynin (DITROPAN-XL) 5 MG 24 hr tablet Take 5 mg by mouth daily. (0800)    [provider]  oxyCODONE (ROXICODONE) 5 MG immediate release tablet Take 1 tablet (5 mg total) by mouth every 4 (four) hours as needed for severe pain. Patient not taking: Reported  on 04/10/2017 03/11/17   Henreitta Leber, MD  oxyCODONE-acetaminophen (PERCOCET) 7.5-325 MG tablet  03/28/17   [provider]  potassium chloride SA (K-DUR,KLOR-CON) 20 MEQ tablet Take 20 mEq by mouth daily. (0800)    [provider]  pyrithione zinc (HEAD AND SHOULDERS) 1 % shampoo Apply 1 application topically daily. t-gel or head and shoulders.    [provider]  rOPINIRole (REQUIP) 3 MG tablet Take 3 mg by mouth at bedtime. (2000)    [provider]  simvastatin (ZOCOR) 10 MG tablet Take 10 mg by mouth at bedtime. (2000)    [provider]  Skin Protectants, Misc. (EUCERIN) cream Apply topically 2 (two) times daily.    [provider]  spironolactone (ALDACTONE) 25 MG tablet Take 25 mg by mouth daily. (0800)    [provider]  vitamin C (ASCORBIC ACID) 500 MG tablet Take 500 mg by mouth 2 (two) times daily. (0800 & 2000)    [provider]  Xylitol (XYLIMELTS) 500 MG DISK Use as directed 1 capsule in the mouth or throat 3 (three) times daily.    [provider]  zolpidem (AMBIEN) 5 MG tablet Take 5 mg by mouth at bedtime. (2000)    [provider]    Allergies Doxycycline  Family History  Problem Relation Age of Onset  . Breast cancer Mother   . Lung cancer Mother   . Stomach cancer Father   . Diabetes Unknown        4 aunts, and 1 uncle  . Colon cancer Neg Hx   . Esophageal cancer Neg Hx   . Rectal cancer Neg Hx     Social History Social History   Tobacco Use  . Smoking status: Former Smoker    Packs/day: 1.50    Years: 50.00    Pack years: 75.00    Types: Cigarettes    Last attempt to quit: 04/06/2013    Years since quitting: 4.0  . Smokeless tobacco: Never Used  Substance Use Topics  . Alcohol use: No    Alcohol/week: 0.0 oz  . Drug use: No    Review of Systems  Constitutional: No fever/chills Eyes: No visual changes. ENT: No sore throat. Cardiovascular: Denies chest  pain. Respiratory: Denies shortness of breath. Gastrointestinal: No abdominal pain.  No nausea, no vomiting.  No diarrhea.  No constipation. Genitourinary: Negative for dysuria. Musculoskeletal: see history of present illness I should also add that at this point patient is also complaining of pain in the hips Skin: Negative for rash. Neurological: Negative for headaches, focal weakness   ____________________________________________   PHYSICAL EXAM:  VITAL SIGNS: ED Triage Vitals  Enc Vitals Group     BP 04/23/17 0627 (!) 148/102     Pulse Rate 04/23/17 0627 82     Resp 04/23/17 0627 17     Temp 04/23/17 0627 98.1 F (36.7 C)     Temp Source 04/23/17 0627 Oral     SpO2 04/23/17 0624 98 %     Weight 04/23/17 0628 220 lb (99.8 kg)     Height --      Head Circumference --      Peak Flow --      Pain Score 04/23/17 0626 10     Pain Loc --      Pain Edu? --      Excl. in [place]? --     Constitutional: Alert and oriented. in pain Eyes: Conjunctivae are normal. pupils are equal Head: Atraumatic. Nose: No congestion/rhinnorhea. Mouth/Throat: Mucous membranes are moist.  Oropharynx non-erythematous. Neck: No stridor.  Cardiovascular: Normal  rate, regular rhythm. Grossly normal heart sounds.  Good peripheral circulation. Respiratory: Normal respiratory effort.  No retractions. Lungs CTAB. Gastrointestinal: Soft and nontender. No distention. No abdominal bruits. No CVA tenderness. Musculoskeletal: see history of present illness Neurologic:  Normal speech and language. patient moving arms and legs well however she complains of numbness in the left shoulder and left side of her body which she says is new. This exam does not change going to CT or when she gets back from CT. I should add that is very difficult to immobilize her neck as she does not fit in any of the c-collar is that we have. We are using towels and [place] and an pillows to immobilize her. Skin:  Skin is warm, dry and  intactexcept for on face. No rash noted. Psychiatric: Mood and affect are normal. Speech and behavior are normal.  ____________________________________________   LABS (all labs ordered are listed, but only abnormal results are displayed)  Labs Reviewed  CBC WITH DIFFERENTIAL/PLATELET  COMPREHENSIVE METABOLIC PANEL  TYPE AND SCREEN   ____________________________________________  EKG    ____________________________________________  RADIOLOGY  ____________________________________________   PROCEDURES  Procedure(s) performed:   Procedures  Critical Care performed:   ____________________________________________   INITIAL IMPRESSION / ASSESSMENT AND PLAN / ED COURSE  old records were reviewed. Since the patient has pain in the shoulder and the upper back and also in the right ribs I will CT her chest instead of doing a bunch of x-rays the pain in the neck seems to be fairly bad and is quite worrisome.    ----------------------------------------- 7:32 AM on 04/23/2017 -----------------------------------------  Patient is back from CT she still moving her arms and legs. She is complaining however of numbness in the left side. we will await CT results. We'll sign the patient out to Dr. Dineen Kid. We will have to discuss probably with [facility] whether or not they want [name] to try to do an MRI or not her just take her down there.   ____________________________________________   FINAL CLINICAL IMPRESSION(S) / ED DIAGNOSES  Final diagnoses:  Post-traumatic headache, not intractable, unspecified chronicity pattern  Neck pain  Numbness  Fall, initial encounter     ED Discharge Orders    None       Note:  This document was prepared using Dragon voice recognition software and may include unintentional dictation errors.    Nena Polio, MD 04/23/17 [phone]

## 2017-04-25 DIAGNOSIS — M4802 Spinal stenosis, cervical region: Secondary | ICD-10-CM | POA: Insufficient documentation

## 2017-04-25 MED ORDER — ENOXAPARIN SODIUM 40 MG/0.4ML ~~LOC~~ SOLN
40.00 mg | SUBCUTANEOUS | Status: DC
Start: 2017-04-26 — End: 2017-04-25

## 2017-04-25 MED ORDER — DICYCLOMINE HCL 10 MG PO CAPS
10.00 mg | ORAL_CAPSULE | ORAL | Status: DC
Start: 2017-04-25 — End: 2017-04-25

## 2017-04-25 MED ORDER — OXYCODONE-ACETAMINOPHEN 5-325 MG PO TABS
1.00 | ORAL_TABLET | ORAL | Status: DC
Start: ? — End: 2017-04-25

## 2017-04-25 MED ORDER — METOCLOPRAMIDE HCL 10 MG PO TABS
5.00 mg | ORAL_TABLET | ORAL | Status: DC
Start: 2017-04-25 — End: 2017-04-25

## 2017-04-25 MED ORDER — LACTULOSE 10 GM/15ML PO SOLN
10.00 | ORAL | Status: DC
Start: 2017-04-26 — End: 2017-04-25

## 2017-04-25 MED ORDER — BUPROPION HCL ER (XL) 150 MG PO TB24
150.00 mg | ORAL_TABLET | ORAL | Status: DC
Start: 2017-04-26 — End: 2017-04-25

## 2017-04-25 MED ORDER — NYSTATIN 100000 UNIT/GM EX POWD
1.00 | CUTANEOUS | Status: DC
Start: 2017-04-25 — End: 2017-04-25

## 2017-04-25 MED ORDER — GENERIC EXTERNAL MEDICATION
30.00 | Status: DC
Start: ? — End: 2017-04-25

## 2017-04-25 MED ORDER — DOCUSATE SODIUM 100 MG PO CAPS
100.00 mg | ORAL_CAPSULE | ORAL | Status: DC
Start: 2017-04-25 — End: 2017-04-25

## 2017-04-25 MED ORDER — GABAPENTIN 300 MG PO CAPS
600.00 mg | ORAL_CAPSULE | ORAL | Status: DC
Start: 2017-04-25 — End: 2017-04-25

## 2017-04-25 MED ORDER — ONDANSETRON HCL 4 MG/2ML IJ SOLN
4.00 mg | INTRAMUSCULAR | Status: DC
Start: ? — End: 2017-04-25

## 2017-04-25 MED ORDER — GENERIC EXTERNAL MEDICATION
45.00 mg | Status: DC
Start: 2017-04-25 — End: 2017-04-25

## 2017-04-25 MED ORDER — CLONAZEPAM 0.5 MG PO TABS
0.25 mg | ORAL_TABLET | ORAL | Status: DC
Start: 2017-04-25 — End: 2017-04-25

## 2017-04-25 MED ORDER — OXYBUTYNIN CHLORIDE 5 MG PO TABS
2.50 mg | ORAL_TABLET | ORAL | Status: DC
Start: 2017-04-25 — End: 2017-04-25

## 2017-04-25 MED ORDER — SPIRONOLACTONE 25 MG PO TABS
25.00 mg | ORAL_TABLET | ORAL | Status: DC
Start: 2017-04-26 — End: 2017-04-25

## 2017-04-25 MED ORDER — GENERIC EXTERNAL MEDICATION
28.00 | Status: DC
Start: 2017-04-25 — End: 2017-04-25

## 2017-04-25 MED ORDER — MORPHINE SULFATE (PF) 4 MG/ML IV SOLN
2.00 mg | INTRAVENOUS | Status: DC
Start: ? — End: 2017-04-25

## 2017-04-25 MED ORDER — ACETAMINOPHEN 325 MG PO TABS
650.00 mg | ORAL_TABLET | ORAL | Status: DC
Start: ? — End: 2017-04-25

## 2017-04-25 MED ORDER — ALBUTEROL SULFATE (2.5 MG/3ML) 0.083% IN NEBU
2.50 mg | INHALATION_SOLUTION | RESPIRATORY_TRACT | Status: DC
Start: ? — End: 2017-04-25

## 2017-04-25 MED ORDER — FUROSEMIDE 80 MG PO TABS
80.00 mg | ORAL_TABLET | ORAL | Status: DC
Start: 2017-04-26 — End: 2017-04-25

## 2017-04-25 MED ORDER — GENERIC EXTERNAL MEDICATION
3.00 mg | Status: DC
Start: 2017-04-25 — End: 2017-04-25

## 2017-04-25 MED ORDER — DEXTROSE 50 % IV SOLN
12.50 | INTRAVENOUS | Status: DC
Start: ? — End: 2017-04-25

## 2017-04-25 MED ORDER — SIMVASTATIN 20 MG PO TABS
10.00 mg | ORAL_TABLET | ORAL | Status: DC
Start: 2017-04-25 — End: 2017-04-25

## 2017-04-25 MED ORDER — SODIUM CHLORIDE 0.9 % IV SOLN
75.00 | INTRAVENOUS | Status: DC
Start: ? — End: 2017-04-25

## 2017-04-25 MED ORDER — LAMOTRIGINE 100 MG PO TABS
100.00 mg | ORAL_TABLET | ORAL | Status: DC
Start: 2017-04-25 — End: 2017-04-25

## 2017-04-25 MED ORDER — INSULIN LISPRO 100 UNIT/ML ~~LOC~~ SOLN
.00 | SUBCUTANEOUS | Status: DC
Start: 2017-04-25 — End: 2017-04-25

## 2017-04-25 MED ORDER — GENERIC EXTERNAL MEDICATION
325.00 mg | Status: DC
Start: 2017-04-25 — End: 2017-04-25

## 2017-04-25 MED ORDER — GENERIC EXTERNAL MEDICATION
2.00 | Status: DC
Start: 2017-04-25 — End: 2017-04-25

## 2017-05-18 ENCOUNTER — Other Ambulatory Visit: Payer: Self-pay | Admitting: Thoracic Surgery (Cardiothoracic Vascular Surgery)

## 2017-05-18 DIAGNOSIS — R918 Other nonspecific abnormal finding of lung field: Secondary | ICD-10-CM

## 2017-05-19 ENCOUNTER — Other Ambulatory Visit: Payer: Self-pay | Admitting: Physician Assistant

## 2017-05-19 DIAGNOSIS — Z1231 Encounter for screening mammogram for malignant neoplasm of breast: Secondary | ICD-10-CM

## 2017-05-19 IMAGING — CR DG CHEST 2V
1 series · 2 of 2 positions shown · non-contrast
Comparison: 07/28/2016

CLINICAL DATA: Chest pain started 30 minutes ago.

EXAM:
CHEST  2 VIEW

[Series 1: dg chest 2 view · 0.14mm/px · 2 of 2 slices shown]
[im 1/2]
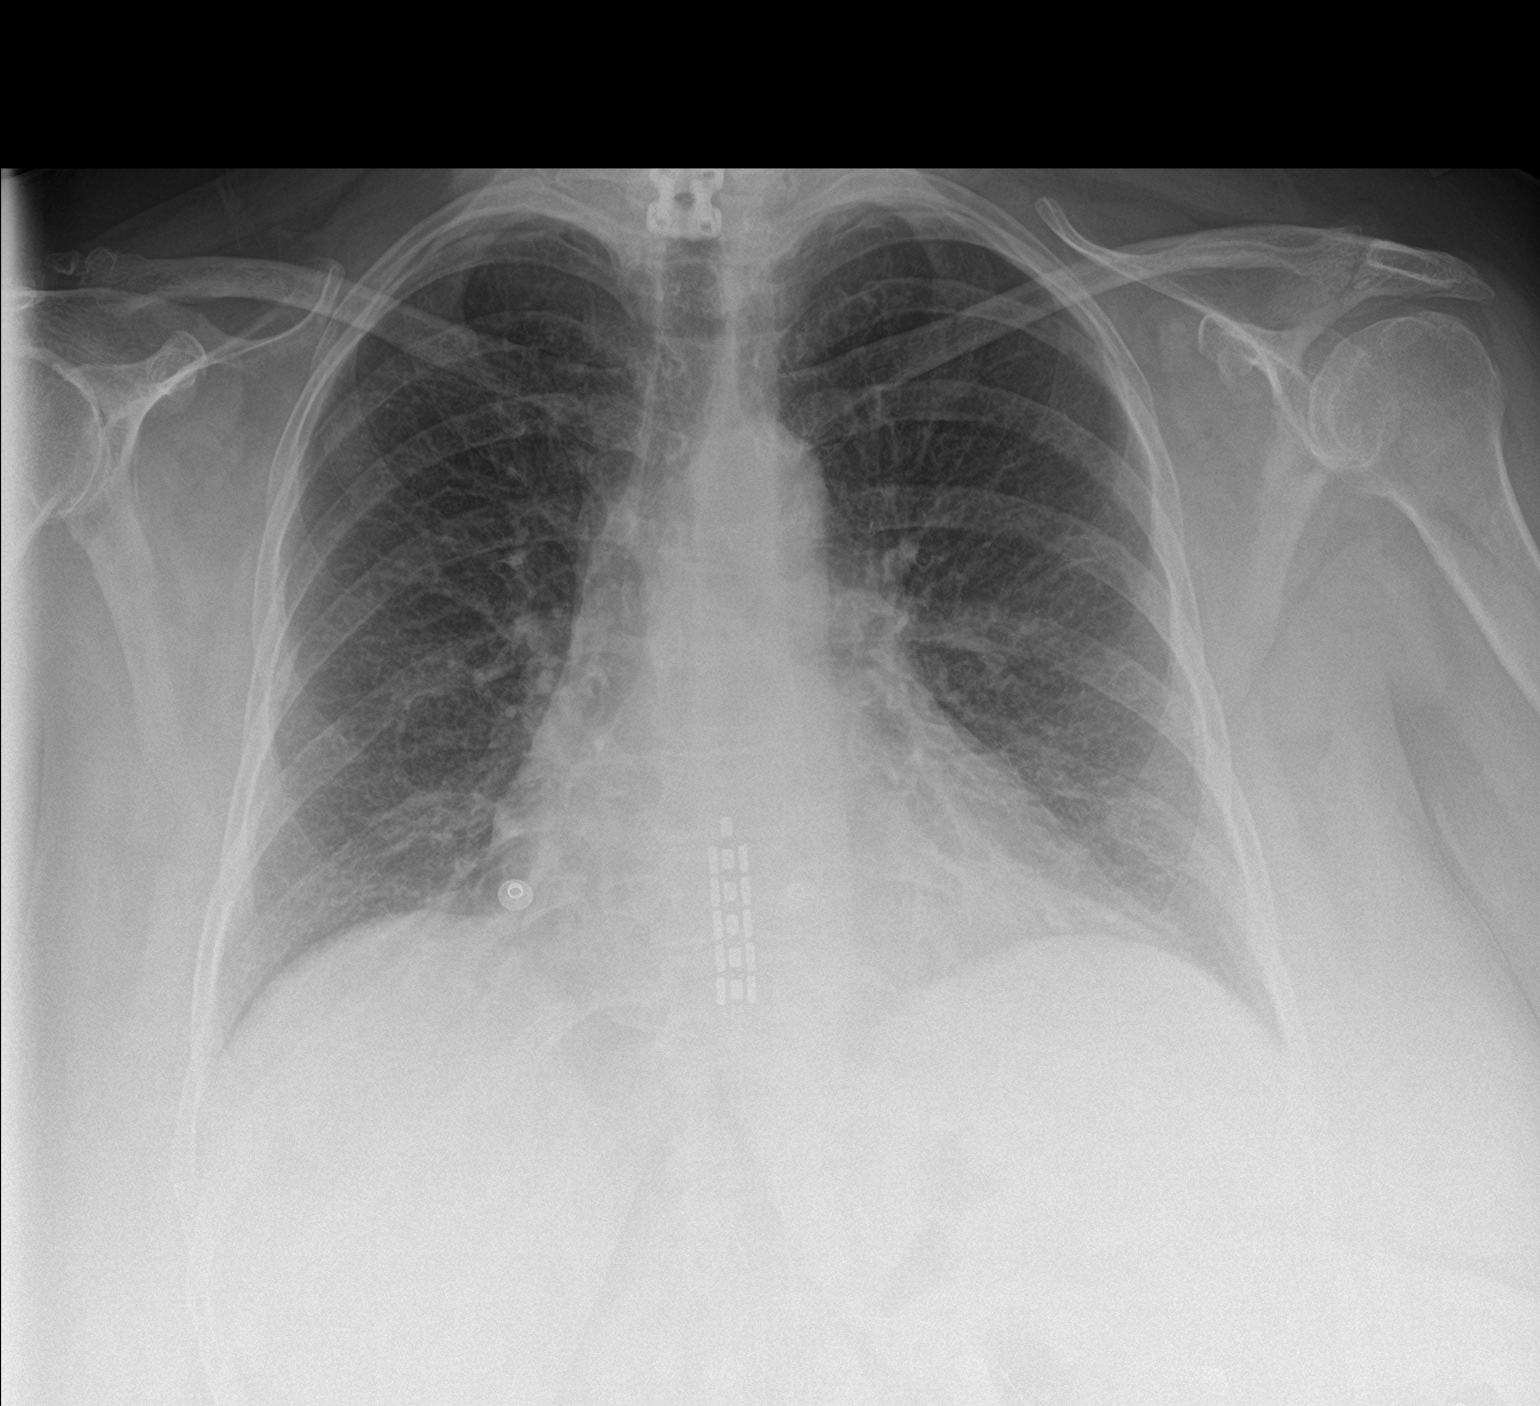
[im 2/2]
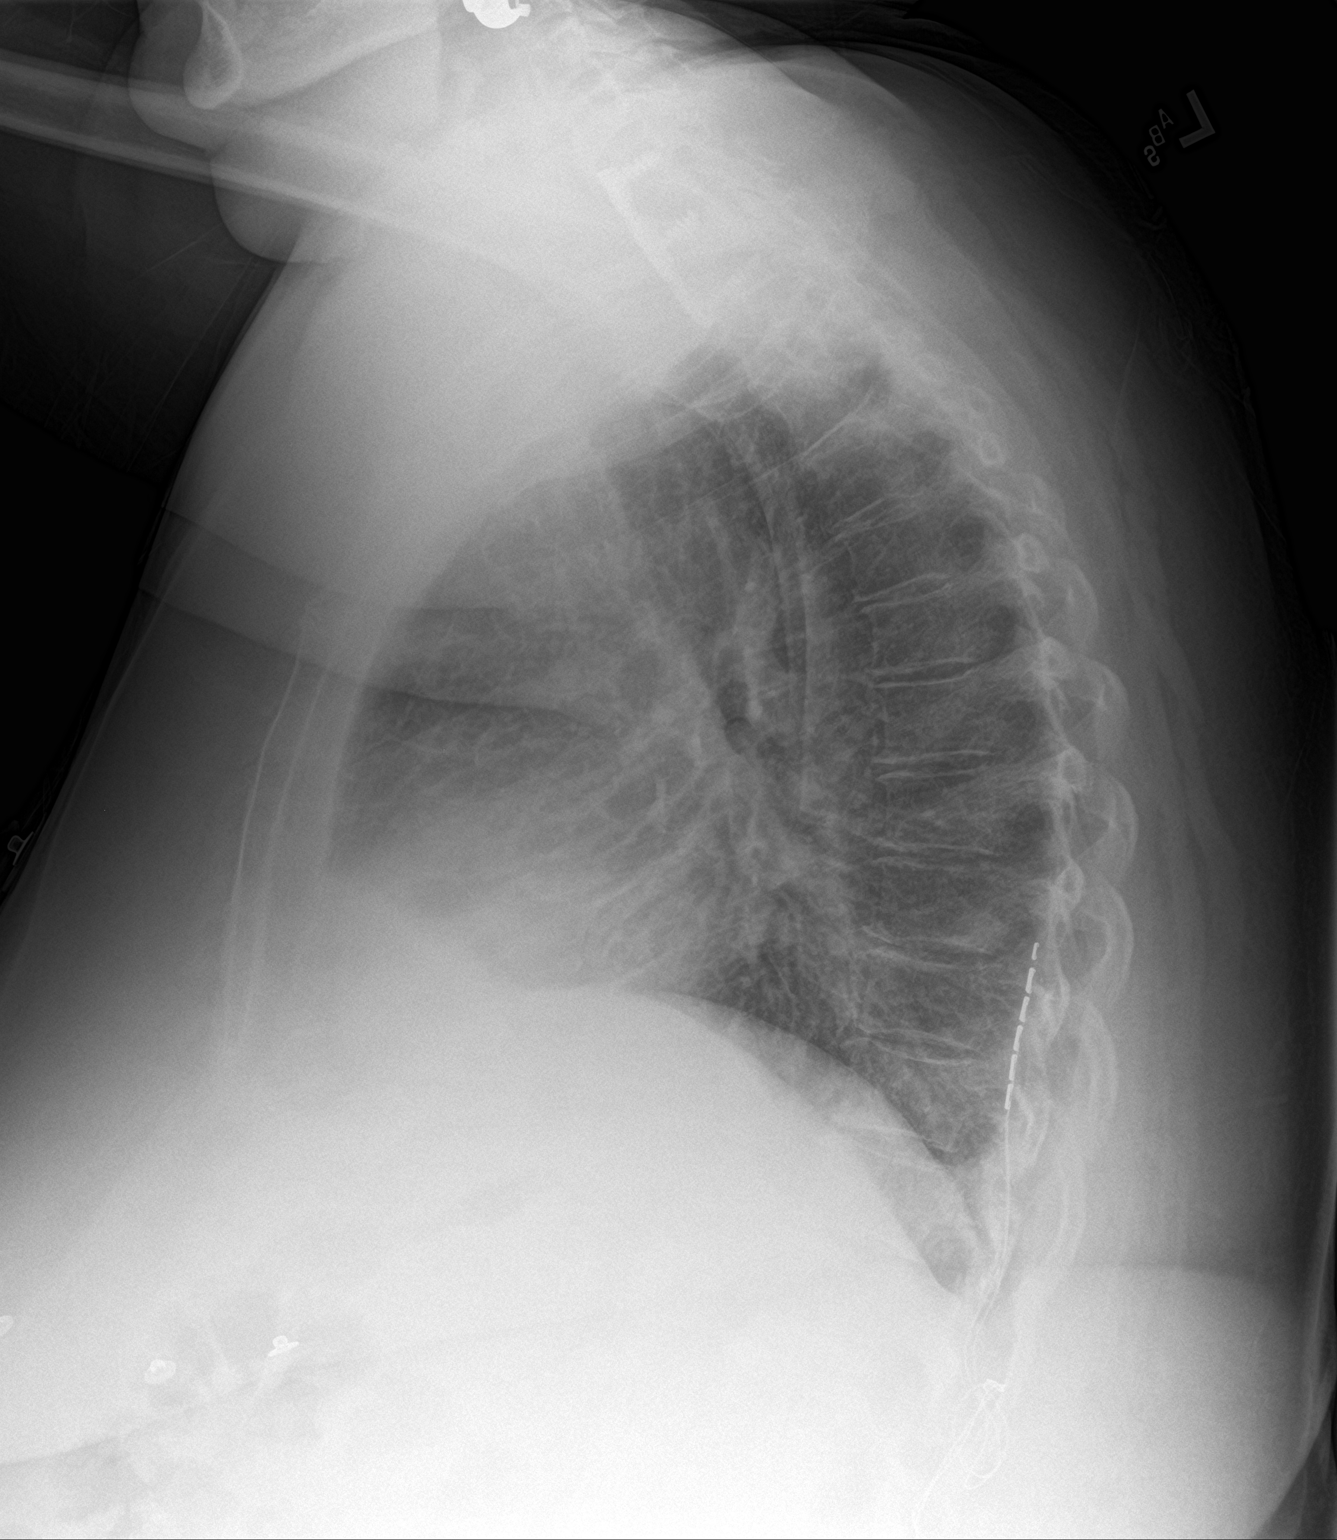

[2 of 2 positions shown; findings below may reference images not displayed]

FINDINGS: There is mild bilateral interstitial thickening. There is no focal
parenchymal opacity. There is no pleural effusion or pneumothorax.
The heart and mediastinal contours are unremarkable.

The osseous structures are unremarkable. There is a spinal
stimulator noted projecting over the lower thoracic spine. There is
lower anterior cervical fusion.
IMPRESSION: No active cardiopulmonary disease.

Stable cardiomegaly.

## 2017-05-23 ENCOUNTER — Ambulatory Visit (HOSPITAL_BASED_OUTPATIENT_CLINIC_OR_DEPARTMENT_OTHER): Payer: Medicare Other | Admitting: Student in an Organized Health Care Education/Training Program

## 2017-05-23 ENCOUNTER — Other Ambulatory Visit: Payer: Self-pay

## 2017-05-23 ENCOUNTER — Encounter: Payer: Self-pay | Admitting: Student in an Organized Health Care Education/Training Program

## 2017-05-23 ENCOUNTER — Ambulatory Visit (INDEPENDENT_AMBULATORY_CARE_PROVIDER_SITE_OTHER): Payer: Medicare Other | Admitting: Vascular Surgery

## 2017-05-23 ENCOUNTER — Encounter (INDEPENDENT_AMBULATORY_CARE_PROVIDER_SITE_OTHER): Payer: Self-pay

## 2017-05-23 ENCOUNTER — Encounter (INDEPENDENT_AMBULATORY_CARE_PROVIDER_SITE_OTHER): Payer: Self-pay | Admitting: Vascular Surgery

## 2017-05-23 VITALS — BP 113/61 | HR 86 | Resp 18 | Ht 63.0 in | Wt 236.0 lb

## 2017-05-23 VITALS — BP 120/59 | HR 86 | Temp 98.0°F | Resp 16 | Ht 63.0 in | Wt 220.0 lb

## 2017-05-23 DIAGNOSIS — I6523 Occlusion and stenosis of bilateral carotid arteries: Secondary | ICD-10-CM | POA: Diagnosis not present

## 2017-05-23 DIAGNOSIS — M4698 Unspecified inflammatory spondylopathy, sacral and sacrococcygeal region: Secondary | ICD-10-CM

## 2017-05-23 DIAGNOSIS — M533 Sacrococcygeal disorders, not elsewhere classified: Secondary | ICD-10-CM | POA: Diagnosis not present

## 2017-05-23 DIAGNOSIS — K219 Gastro-esophageal reflux disease without esophagitis: Secondary | ICD-10-CM | POA: Diagnosis not present

## 2017-05-23 DIAGNOSIS — G894 Chronic pain syndrome: Secondary | ICD-10-CM

## 2017-05-23 DIAGNOSIS — M797 Fibromyalgia: Secondary | ICD-10-CM

## 2017-05-23 DIAGNOSIS — Z66 Do not resuscitate: Secondary | ICD-10-CM | POA: Diagnosis not present

## 2017-05-23 DIAGNOSIS — I8392 Asymptomatic varicose veins of left lower extremity: Secondary | ICD-10-CM | POA: Diagnosis not present

## 2017-05-23 DIAGNOSIS — K7460 Unspecified cirrhosis of liver: Secondary | ICD-10-CM | POA: Diagnosis present

## 2017-05-23 DIAGNOSIS — E1065 Type 1 diabetes mellitus with hyperglycemia: Secondary | ICD-10-CM | POA: Diagnosis not present

## 2017-05-23 DIAGNOSIS — K7581 Nonalcoholic steatohepatitis (NASH): Secondary | ICD-10-CM | POA: Diagnosis not present

## 2017-05-23 DIAGNOSIS — G2581 Restless legs syndrome: Secondary | ICD-10-CM | POA: Diagnosis not present

## 2017-05-23 DIAGNOSIS — I10 Essential (primary) hypertension: Secondary | ICD-10-CM | POA: Diagnosis not present

## 2017-05-23 DIAGNOSIS — Z981 Arthrodesis status: Secondary | ICD-10-CM

## 2017-05-23 DIAGNOSIS — E785 Hyperlipidemia, unspecified: Secondary | ICD-10-CM | POA: Diagnosis not present

## 2017-05-23 DIAGNOSIS — J432 Centrilobular emphysema: Secondary | ICD-10-CM | POA: Diagnosis not present

## 2017-05-23 DIAGNOSIS — D509 Iron deficiency anemia, unspecified: Secondary | ICD-10-CM | POA: Diagnosis present

## 2017-05-23 DIAGNOSIS — D696 Thrombocytopenia, unspecified: Secondary | ICD-10-CM | POA: Diagnosis not present

## 2017-05-23 DIAGNOSIS — X58XXXA Exposure to other specified factors, initial encounter: Secondary | ICD-10-CM | POA: Diagnosis not present

## 2017-05-23 DIAGNOSIS — F419 Anxiety disorder, unspecified: Secondary | ICD-10-CM | POA: Diagnosis not present

## 2017-05-23 DIAGNOSIS — M47818 Spondylosis without myelopathy or radiculopathy, sacral and sacrococcygeal region: Secondary | ICD-10-CM

## 2017-05-23 DIAGNOSIS — G4733 Obstructive sleep apnea (adult) (pediatric): Secondary | ICD-10-CM | POA: Diagnosis not present

## 2017-05-23 DIAGNOSIS — E559 Vitamin D deficiency, unspecified: Secondary | ICD-10-CM | POA: Diagnosis not present

## 2017-05-23 DIAGNOSIS — M79604 Pain in right leg: Secondary | ICD-10-CM

## 2017-05-23 DIAGNOSIS — F319 Bipolar disorder, unspecified: Secondary | ICD-10-CM | POA: Diagnosis not present

## 2017-05-23 DIAGNOSIS — G4700 Insomnia, unspecified: Secondary | ICD-10-CM | POA: Diagnosis not present

## 2017-05-23 DIAGNOSIS — S83241A Other tear of medial meniscus, current injury, right knee, initial encounter: Secondary | ICD-10-CM | POA: Diagnosis not present

## 2017-05-23 DIAGNOSIS — J45909 Unspecified asthma, uncomplicated: Secondary | ICD-10-CM | POA: Diagnosis not present

## 2017-05-23 NOTE — Progress Notes (Signed)
Patient's Name: Stephanie [name]  MRN: [id number]  Referring Provider: Sarajane Jews, MD  DOB: [birth year]  PCP: Sarajane Jews, MD  DOS: 05/23/2017  Note by: Gillis Santa, MD  Service setting: Ambulatory outpatient  Specialty: Interventional Pain Management  Location: [facility] (AMB) Pain Management Facility    Patient type: Established   Primary Reason(s) for Visit: Encounter for prescription drug management. (Level of risk: moderate)  CC: Leg Pain (bilateral, upper)  HPI  Ms. Hampshire is a 66 y.o. year old, female patient, who comes today for a medication management evaluation. She has UNSPECIFIED VITAMIN D DEFICIENCY; HLD (hyperlipidemia); TOBACCO ABUSE; OSA (obstructive sleep apnea); RESTLESS LEGS SYNDROME; Essential hypertension; Asthma; BACK PAIN, LUMBAR, CHRONIC; INSOMNIA; UNS ADVRS EFF OTH RX MEDICINAL&BIOLOGICAL SBSTNC; BIPOLAR AFFECTIVE DISORDER, HX OF; Aortic dissection ([place]); Anxiety; Cirrhosis of liver ([place]); Varicose veins of left lower extremity; GERD (gastroesophageal reflux disease); Dyspnea; Lung nodule; DNR (do not resuscitate); Encounter for routine gynecological examination; Poorly controlled type 2 diabetes mellitus with complication ([place]); Dizziness and giddiness; DOE (dyspnea on exertion); Liver cirrhosis secondary to NASH ([place]); Abdominal pain, chronic, epigastric; Thrombocytopenia ([place]); Neuropathy; Hyperglycemia; Hypoglycemia; Iron deficiency anemia; Chest pain; Pneumonia; Pain in limb; Swelling of limb; Postphlebitic syndrome with inflammation; Lymphedema; Encephalopathy; Bilateral carotid artery stenosis; Degenerative tear of lateral meniscus of right knee; Degenerative tear of medial meniscus of right knee; Diabetes mellitus type 1, uncontrolled, without complications ([place]); Primary osteoarthritis of right knee; Altered mental status; and Elevated alkaline phosphatase level on their problem list. Her primarily concern today is the Leg Pain (bilateral, upper)  Pain Assessment: Location:  Right, Left, Upper Leg Radiating:   Onset: More than a month ago Duration: Chronic pain Quality: Throbbing, Stabbing Severity: 8 /10 (self-reported pain score)  Note: Reported level is inconsistent with clinical observations. Clinically the patient looks like a 3/10 A 3/10 is viewed as "Moderate" and described as significantly interfering with activities of daily living (ADL). It becomes difficult to feed, bathe, get dressed, get on and off the toilet or to perform personal hygiene functions. Difficult to get in and out of bed or a chair without assistance. Very distracting. With effort, it can be ignored when deeply involved in activities.       When using our objective Pain Scale, levels between 6 and 10/10 are said to belong in an emergency room, as it progressively worsens from a 6/10, described as severely limiting, requiring emergency care not usually available at an outpatient pain management facility. At a 6/10 level, communication becomes difficult and requires great effort. Assistance to reach the emergency department may be required. Facial flushing and profuse sweating along with potentially dangerous increases in heart rate and blood pressure will be evident. Effect on ADL:   Timing: Constant Modifying factors: sitting, resting  Ms. Rudder was last scheduled for an appointment on 02/02/2017 for medication management. During today's appointment we reviewed Ms. Porreca's chronic pain status, as well as her outpatient medication regimen.  The patient  reports that she does not use drugs. Her body mass index is 38.97 kg/m.  Further details on both, my assessment(s), as well as the proposed treatment plan, please see below.  Controlled Substance Pharmacotherapy Assessment REMS (Risk Evaluation and Mitigation Strategy)   Monitoring: [place] PMP: Online review of the past 24-monthperiod conducted. Compliant with practice rules and regulations Last UDS on record: Summary  Date Value Ref  Range Status  02/02/2017 FINAL  Final    Comment:    ==================================================================== TOXASSURE COMP DRUG ANALYSIS,UR ====================================================================  Test                             Result       Flag       Units Drug Present and Declared for Prescription Verification   7-aminoclonazepam              330          EXPECTED   ng/mg creat    7-aminoclonazepam is an expected metabolite of clonazepam. Source    of clonazepam is a scheduled prescription medication.   Tramadol                       11726        EXPECTED   ng/mg creat   O-Desmethyltramadol            3096         EXPECTED   ng/mg creat   N-Desmethyltramadol            7048         EXPECTED   ng/mg creat    Source of tramadol is a prescription medication.    O-desmethyltramadol and N-desmethyltramadol are expected    metabolites of tramadol.   Gabapentin                     PRESENT      EXPECTED   Pregabalin                     PRESENT      EXPECTED   Lamotrigine                    PRESENT      EXPECTED   Bupropion                      PRESENT      EXPECTED   Hydroxybupropion               PRESENT      EXPECTED    Hydroxybupropion is an expected metabolite of bupropion.   Mirtazapine                    PRESENT      EXPECTED   Acetaminophen                  PRESENT      EXPECTED   Lidocaine                      PRESENT      EXPECTED Drug Present not Declared for Prescription Verification   Alcohol, Ethyl                 0.124        UNEXPECTED g/dL    Sources of ethyl alcohol include alcoholic beverages or as a    fermentation product of glucose; glucose was not detected in this    specimen. Ethyl alcohol result should be interpreted in the    context of all available clinical and behavioral information.   Ibuprofen                      PRESENT      UNEXPECTED Drug Absent but Declared for Prescription Verification   Oxycodone  Not  Detected UNEXPECTED ng/mg creat   Tizanidine                     Not Detected UNEXPECTED    Tizanidine, as indicated in the declared medication list, is not    always detected even when used as directed.   Zolpidem                       Not Detected UNEXPECTED    Zolpidem, as indicated in the declared medication list, is not    always detected even when used as directed.   Diclofenac                     Not Detected UNEXPECTED    Diclofenac, as indicated in the declared medication list, is not    always detected even when used as directed.   Diphenhydramine                Not Detected UNEXPECTED   Guaifenesin                    Not Detected UNEXPECTED ==================================================================== Test                      Result    Flag   Units      Ref Range   Creatinine              27               mg/dL      >=20 ==================================================================== Declared Medications:  The flagging and interpretation on this report are based on the  following declared medications.  Unexpected results may arise from  inaccuracies in the declared medications.  **Note: The testing scope of this panel includes these medications:  Bupropion (Wellbutrin)  Clonazepam (Klonopin)  Diphenhydramine (Benadryl)  Gabapentin (Neurontin)  Guaifenesin  Lamotrigine (Lamictal)  Mirtazapine (Remeron)  Oxycodone (Oxy IR)  Oxycodone (Roxicodone)  Pregabalin (Lyrica)  Tramadol (Ultram)  **Note: The testing scope of this panel does not include small to  moderate amounts of these reported medications:  Acetaminophen (Tylenol)  Diclofenac (Voltaren)  Lidocaine (Lidoderm)  Tizanidine (Zanaflex)  Zolpidem (Ambien)  **Note: The testing scope of this panel does not include following  reported medications:  Albuterol (Proventil)  Albuterol (Ventolin HFA)  Budenoside (Symbicort)  Dicyclomine (Bentyl)  Formoterol (Symbicort)  Furosemide (Lasix)  Insulin  (Humalog)  Insulin (Lantus)  Iron (Ferrous Sulfate)  Lactulose  Loperamide (Imodium)  Magnesium (Milk of Magnesia)  Metoclopramide (Reglan)  Omeprazole (Prilosec)  Ondansetron (Zofran)  Oxybutynin (Ditropan)  Potassium (K-Dur)  Potassium (Klor-Con)  Ropinirole  Simvastatin (Zocor)  Spironolactone (Aldactone)  Topical  Vitamin C ==================================================================== For clinical consultation, please call [phone]. ====================================================================    Pharmacologic Plan: Ms. Lollar is not a candidate for opioid therapy at this time  Laboratory Chemistry  Inflammation Markers (CRP: Acute Phase) (ESR: Chronic Phase) Lab Results  Component Value Date   ESRSEDRATE 71 (H) 04/20/2016   LATICACIDVEN 1.2 11/23/2016                 Rheumatology Markers Lab Results  Component Value Date   ANA POS (A) 03/19/2012                Renal Function Markers Lab Results  Component Value Date   BUN 20 04/23/2017   CREATININE 0.76 04/23/2017   GFRAA >60 04/23/2017   GFRNONAA >  60 04/23/2017                 Hepatic Function Markers Lab Results  Component Value Date   AST 26 04/23/2017   ALT 27 04/23/2017   ALBUMIN 3.2 (L) 04/23/2017   ALKPHOS 180 (H) 04/23/2017   HCVAB NEGATIVE 02/03/2012   AMYLASE 30 06/12/2014   LIPASE 17 12/09/2015   AMMONIA 39 (H) 03/07/2017                 Electrolytes Lab Results  Component Value Date   NA 136 04/23/2017   K 3.6 04/23/2017   CL 100 (L) 04/23/2017   CALCIUM 8.5 (L) 04/23/2017   MG 2.4 04/05/2016                 Neuropathy Markers Lab Results  Component Value Date   VITAMINB12 431 11/13/2015   FOLATE 29.0 11/13/2015   HGBA1C 5.2 07/20/2016                 Bone Pathology Markers Lab Results  Component Value Date   VD25OH 30 11/12/2012   VD125OH2TOT 10 (L) 06/19/2008                 Coagulation Parameters Lab Results  Component Value Date   INR  1.00 03/07/2017   LABPROT 13.1 03/07/2017   APTT 37 (H) 03/07/2017   PLT 104 (L) 04/23/2017                 Cardiovascular Markers Lab Results  Component Value Date   BNP 108.0 (H) 07/28/2016   CKTOTAL 119 06/30/2012   CKMB 0.6 06/30/2012   TROPONINI <0.03 03/07/2017   HGB 14.0 04/23/2017   HCT 41.7 04/23/2017                 CA Markers No results found for: CEA, CA125, LABCA2               Note: Lab results reviewed.  Recent Diagnostic Imaging Results  CT Abdomen Pelvis Wo Contrast CLINICAL DATA:  [place] out of bed. Left face and aspiration. Rib fracture suspected, post trauma.  EXAM: CT CHEST, ABDOMEN AND PELVIS WITHOUT CONTRAST  TECHNIQUE: Multidetector CT imaging of the chest, abdomen and pelvis was performed following the standard protocol without IV contrast.  COMPARISON:  CT chest 06/28/2016. CT chest, abdomen, and pelvis 12/09/2015.  FINDINGS: CT CHEST FINDINGS  Cardiovascular: The heart is mildly enlarged. Coronary artery calcifications are present. Calcifications are present at the origins the great vessels and within the descending thoracic aorta. Pulmonary artery is are unremarkable.  Mediastinum/Nodes: No significant mediastinal or hilar adenopathy is present. No significant axillary adenopathy is present. The thoracic inlet is within normal limits. The esophagus is unremarkable.  Lungs/Pleura: Asymmetric peripheral airspace disease is present in the right lung. No discrete nodule or mass lesion is present. There is no pneumothorax. Minimal atelectasis is present in the left lung. Centrilobular emphysema is present.  Musculoskeletal: Ribs are intact. Twelve rib-bearing thoracic type vertebral bodies are present. Vertebral body heights are maintained. No acute fractures are present. Dorsal spinal cord stimulator is stable, terminating at T8-9  CT ABDOMEN PELVIS FINDINGS  Hepatobiliary: Cirrhotic changes of the liver are again noted. No discrete  mass lesion is present. There is marked nodularity. The common bile duct is within normal limits. Gallbladder is surgically absent.  Pancreas: Unremarkable. No pancreatic ductal dilatation or surrounding inflammatory changes.  Spleen: Splenomegaly is again noted. This suggests portal venous hypertension.  Adrenals/Urinary Tract: Adrenal glands are normal bilaterally. Kidneys and ureters are within normal limits. The urinary bladder is normal.  Stomach/Bowel: The stomach and duodenum are within normal limits. Small bowel is unremarkable. Terminal ileum is within normal limits. Appendix is visualized and normal. The ascending and transverse colon are normal. Descending and sigmoid colon are within normal limits.  Vascular/Lymphatic: Extensive atherosclerotic calcifications are again noted in the in descending thoracic and abdominal aorta and branch vessels without aneurysm.  Reproductive: Status post hysterectomy. No adnexal masses.  Other: No abdominal wall hernia or abnormality. No abdominopelvic ascites.  Musculoskeletal: Fusion at L3-4, L4-5, and L5-S1 is stable. Degenerative changes are again noted at the SI joints. No acute fractures are present. Hips are located and within normal limits bilaterally.  No significant soft tissue injury is present.  IMPRESSION: 1. Asymmetric peripheral airspace opacities in the right lung may represent contusion. Focal airspace disease is considered less likely. 2. No associated rib fracture or pneumothorax. 3. No other acute trauma to the chest, abdomen, or pelvis. 4.  Aortic Atherosclerosis (ICD10-I70.0). 5.  Emphysema (ICD10-J43.9). 6. Cirrhosis with associated splenomegaly. 7. Spinal cord stimulator. 8. Fusion of the lower lumbar spine at L3-4, L4-5, and L5-S1.  Electronically Signed   By: San Morelle M.D.   On: 04/23/2017 07:42 CT Chest Wo Contrast CLINICAL DATA:  [place] out of bed. Left face and aspiration.  Rib fracture suspected, post trauma.  EXAM: CT CHEST, ABDOMEN AND PELVIS WITHOUT CONTRAST  TECHNIQUE: Multidetector CT imaging of the chest, abdomen and pelvis was performed following the standard protocol without IV contrast.  COMPARISON:  CT chest 06/28/2016. CT chest, abdomen, and pelvis 12/09/2015.  FINDINGS: CT CHEST FINDINGS  Cardiovascular: The heart is mildly enlarged. Coronary artery calcifications are present. Calcifications are present at the origins the great vessels and within the descending thoracic aorta. Pulmonary artery is are unremarkable.  Mediastinum/Nodes: No significant mediastinal or hilar adenopathy is present. No significant axillary adenopathy is present. The thoracic inlet is within normal limits. The esophagus is unremarkable.  Lungs/Pleura: Asymmetric peripheral airspace disease is present in the right lung. No discrete nodule or mass lesion is present. There is no pneumothorax. Minimal atelectasis is present in the left lung. Centrilobular emphysema is present.  Musculoskeletal: Ribs are intact. Twelve rib-bearing thoracic type vertebral bodies are present. Vertebral body heights are maintained. No acute fractures are present. Dorsal spinal cord stimulator is stable, terminating at T8-9  CT ABDOMEN PELVIS FINDINGS  Hepatobiliary: Cirrhotic changes of the liver are again noted. No discrete mass lesion is present. There is marked nodularity. The common bile duct is within normal limits. Gallbladder is surgically absent.  Pancreas: Unremarkable. No pancreatic ductal dilatation or surrounding inflammatory changes.  Spleen: Splenomegaly is again noted. This suggests portal venous hypertension.  Adrenals/Urinary Tract: Adrenal glands are normal bilaterally. Kidneys and ureters are within normal limits. The urinary bladder is normal.  Stomach/Bowel: The stomach and duodenum are within normal limits. Small bowel is unremarkable. Terminal  ileum is within normal limits. Appendix is visualized and normal. The ascending and transverse colon are normal. Descending and sigmoid colon are within normal limits.  Vascular/Lymphatic: Extensive atherosclerotic calcifications are again noted in the in descending thoracic and abdominal aorta and branch vessels without aneurysm.  Reproductive: Status post hysterectomy. No adnexal masses.  Other: No abdominal wall hernia or abnormality. No abdominopelvic ascites.  Musculoskeletal: Fusion at L3-4, L4-5, and L5-S1 is stable. Degenerative changes are again noted at the SI joints. No acute fractures are  present. Hips are located and within normal limits bilaterally.  No significant soft tissue injury is present.  IMPRESSION: 1. Asymmetric peripheral airspace opacities in the right lung may represent contusion. Focal airspace disease is considered less likely. 2. No associated rib fracture or pneumothorax. 3. No other acute trauma to the chest, abdomen, or pelvis. 4.  Aortic Atherosclerosis (ICD10-I70.0). 5.  Emphysema (ICD10-J43.9). 6. Cirrhosis with associated splenomegaly. 7. Spinal cord stimulator. 8. Fusion of the lower lumbar spine at L3-4, L4-5, and L5-S1.  Electronically Signed   By: San Morelle M.D.   On: 04/23/2017 07:42 CT Cervical Spine Wo Contrast CLINICAL DATA:  [place] out of bed. Hit head on night stand. Laceration to the left side of the face. Headache and neck pain.  EXAM: CT HEAD WITHOUT CONTRAST  CT CERVICAL SPINE WITHOUT CONTRAST  TECHNIQUE: Multidetector CT imaging of the head and cervical spine was performed following the standard protocol without intravenous contrast. Multiplanar CT image reconstructions of the cervical spine were also generated.  COMPARISON:  CT head without contrast 03/07/2017. MRI brain 03/08/2017. CTA head and neck 03/07/2017.  FINDINGS: CT HEAD FINDINGS  Brain: No acute infarct, hemorrhage, or mass lesion is  present. The ventricles are of normal size. No significant white matter disease is present. The brainstem and cerebellum are normal. No significant extraaxial fluid collection is present.  Vascular: Atherosclerotic calcifications are present in the cavernous internal carotid artery is bilaterally and at the dural margin of the right vertebral artery. There is no hyperdense vessel.  Skull: The a left parietal scalp laceration is present. A a small hematoma is noted. There is no underlying fracture or radiopaque foreign body. Extracranial soft tissues are otherwise within normal limits.  Sinuses/Orbits: The paranasal sinuses are clear. Nasal cavity is opacified bilaterally. Bilateral lens replacements are present. Globes and orbits are within normal limits bilaterally.  CT CERVICAL SPINE FINDINGS  Alignment: AP alignment is anatomic.  Skull base and vertebrae: The craniocervical junction is normal. Solid ACDF at C5-6 and C6-7 is intact. No acute fracture is present.  Soft tissues and spinal canal: The soft tissues the neck demonstrate mild atherosclerotic changes of the right carotid bifurcation without definite stenosis. No other focal lesions are present. There is no significant adenopathy.  Disc levels: Adjacent level disease is present at C4-5 with asymmetric right-sided facet hypertrophy and right foraminal narrowing. Asymmetric left-sided facet hypertrophy and uncovertebral spurring is present at C3-4 with moderate left foraminal stenosis. Bilateral facet hypertrophy is present at C7-T1.  Upper chest: Asymmetric edema is present in the right upper lobe. Atherosclerotic changes are present at the origins of the great vessels.  IMPRESSION: 1. Left parietal scalp laceration without underlying fracture. 2. Normal CT appearance of the brain for age. 3. Bilateral nasal cavity opacification without significant sinus disease. This likely represents diffuse mucosal  thickening. 4. Stable cervical spine fusion without evidence for acute fracture or traumatic subluxation to the cervical spine. 5. Degenerative changes in the cervical spine most pronounced on the left at C3-4 and on the right at C4-5. 6. Aortic atherosclerosis.  Electronically Signed   By: San Morelle M.D.   On: 04/23/2017 07:26 CT Head Wo Contrast CLINICAL DATA:  [place] out of bed. Hit head on night stand. Laceration to the left side of the face. Headache and neck pain.  EXAM: CT HEAD WITHOUT CONTRAST  CT CERVICAL SPINE WITHOUT CONTRAST  TECHNIQUE: Multidetector CT imaging of the head and cervical spine was performed following the standard protocol without  intravenous contrast. Multiplanar CT image reconstructions of the cervical spine were also generated.  COMPARISON:  CT head without contrast 03/07/2017. MRI brain 03/08/2017. CTA head and neck 03/07/2017.  FINDINGS: CT HEAD FINDINGS  Brain: No acute infarct, hemorrhage, or mass lesion is present. The ventricles are of normal size. No significant white matter disease is present. The brainstem and cerebellum are normal. No significant extraaxial fluid collection is present.  Vascular: Atherosclerotic calcifications are present in the cavernous internal carotid artery is bilaterally and at the dural margin of the right vertebral artery. There is no hyperdense vessel.  Skull: The a left parietal scalp laceration is present. A a small hematoma is noted. There is no underlying fracture or radiopaque foreign body. Extracranial soft tissues are otherwise within normal limits.  Sinuses/Orbits: The paranasal sinuses are clear. Nasal cavity is opacified bilaterally. Bilateral lens replacements are present. Globes and orbits are within normal limits bilaterally.  CT CERVICAL SPINE FINDINGS  Alignment: AP alignment is anatomic.  Skull base and vertebrae: The craniocervical junction is normal. Solid ACDF at C5-6 and  C6-7 is intact. No acute fracture is present.  Soft tissues and spinal canal: The soft tissues the neck demonstrate mild atherosclerotic changes of the right carotid bifurcation without definite stenosis. No other focal lesions are present. There is no significant adenopathy.  Disc levels: Adjacent level disease is present at C4-5 with asymmetric right-sided facet hypertrophy and right foraminal narrowing. Asymmetric left-sided facet hypertrophy and uncovertebral spurring is present at C3-4 with moderate left foraminal stenosis. Bilateral facet hypertrophy is present at C7-T1.  Upper chest: Asymmetric edema is present in the right upper lobe. Atherosclerotic changes are present at the origins of the great vessels.  IMPRESSION: 1. Left parietal scalp laceration without underlying fracture. 2. Normal CT appearance of the brain for age. 3. Bilateral nasal cavity opacification without significant sinus disease. This likely represents diffuse mucosal thickening. 4. Stable cervical spine fusion without evidence for acute fracture or traumatic subluxation to the cervical spine. 5. Degenerative changes in the cervical spine most pronounced on the left at C3-4 and on the right at C4-5. 6. Aortic atherosclerosis.  Electronically Signed   By: San Morelle M.D.   On: 04/23/2017 07:26  Complexity Note: Imaging results reviewed. Results shared with Ms. Somes, using [employer].                         Meds   Current Outpatient Medications:  .  albuterol (PROVENTIL HFA;VENTOLIN HFA) 108 (90 Base) MCG/ACT inhaler, Inhale 2 puffs into the lungs every 6 (six) hours as needed for wheezing or shortness of breath., Disp: 1 Inhaler, Rfl: 2 .  albuterol (PROVENTIL) (2.5 MG/3ML) 0.083% nebulizer solution, Take 2.5 mg by nebulization every 6 (six) hours as needed for wheezing or shortness of breath., Disp: , Rfl:  .  alum & mag hydroxide-simeth (ANTACID) 200-200-20 MG/5ML suspension, Take  30 mLs by mouth every 4 (four) hours as needed for indigestion or heartburn., Disp: , Rfl:  .  aspirin 325 MG tablet, Take 325 mg by mouth daily. , Disp: , Rfl:  .  atorvastatin (LIPITOR) 40 MG tablet, , Disp: , Rfl:  .  barrier cream (NON-SPECIFIED) CREA, Apply 1 application topically as needed (APPLIED AFTER TOILETING TO PREVENT SKIN BREAKDOWN)., Disp: , Rfl:  .  budesonide-formoterol (SYMBICORT) 160-4.5 MCG/ACT inhaler, Inhale 2 puffs into the lungs 2 (two) times daily. (0800 & 2000), Disp: , Rfl:  .  buPROPion [facility]  SR) 150 MG 12 hr tablet, Take 150 mg by mouth daily. (0800), Disp: , Rfl:  .  clonazePAM (KLONOPIN) 0.5 MG tablet, Take by mouth., Disp: , Rfl:  .  dicyclomine (BENTYL) 10 MG capsule, Take 10 mg by mouth 3 (three) times daily. (0800, 1300 & 2000), Disp: , Rfl:  .  ferrous sulfate 325 (65 FE) MG tablet, Take 325 mg by mouth 2 (two) times daily. (0800 & 2000), Disp: , Rfl:  .  furosemide (LASIX) 80 MG tablet, Take 80 mg by mouth daily. (0800), Disp: , Rfl:  .  gabapentin (NEURONTIN) 300 MG capsule, Take 900 mg by mouth at bedtime., Disp: , Rfl:  .  gabapentin (NEURONTIN) 600 MG tablet, Take 600 mg by mouth daily. Take 600 mg by mouth in the morning and 900 mg by mouth at bedtime., Disp: , Rfl:  .  insulin glargine (LANTUS) 100 UNIT/ML injection, Inject 28 Units into the skin at bedtime., Disp: , Rfl:  .  insulin lispro (HUMALOG KWIKPEN) 100 UNIT/ML KiwkPen, Inject 0-12 Units into the skin 3 (three) times daily. Check FSBS before each meal & cover with SSI (sliding scale insulin) , Disp: , Rfl:  .  lactulose (CHRONULAC) 10 GM/15ML solution, Take 10 g by mouth every Monday, Wednesday, and Friday. , Disp: , Rfl:  .  lamoTRIgine (LAMICTAL) 100 MG tablet, Take 100 mg by mouth 2 (two) times daily. (0800 & 2000), Disp: , Rfl:  .  lidocaine (LIDODERM) 5 %, Place 3 patches onto the skin daily. Apply to back, Disp: , Rfl:  .  loperamide (IMODIUM) 2 MG capsule, Take 4 mg by mouth as needed  for diarrhea or loose stools. *Do not exceed 8 doses in 24 hours*, Disp: , Rfl:  .  LYRICA 150 MG capsule, Take 150 mg by mouth 3 (three) times daily., Disp: , Rfl:  .  metoCLOPramide (REGLAN) 5 MG tablet, Take 5 mg by mouth 4 (four) times daily -  before meals and at bedtime. (0800, 1200, 1600 & 2000), Disp: , Rfl:  .  mirtazapine (REMERON) 45 MG tablet, Take 45 mg by mouth at bedtime. (2000) , Disp: , Rfl:  .  omeprazole (PRILOSEC) 20 MG capsule, Take by mouth., Disp: , Rfl:  .  ondansetron (ZOFRAN) 4 MG tablet, Take 4 mg by mouth every 4 (four) hours as needed for nausea or vomiting., Disp: , Rfl:  .  oxybutynin (DITROPAN-XL) 5 MG 24 hr tablet, Take 5 mg by mouth daily. (0800), Disp: , Rfl:  .  potassium chloride SA (K-DUR,KLOR-CON) 20 MEQ tablet, Take 20 mEq by mouth daily. (0800), Disp: , Rfl:  .  rOPINIRole (REQUIP) 3 MG tablet, Take 3 mg by mouth at bedtime. (2000), Disp: , Rfl:  .  simvastatin (ZOCOR) 10 MG tablet, Take 10 mg by mouth at bedtime. (2000), Disp: , Rfl:  .  vitamin C (ASCORBIC ACID) 500 MG tablet, Take 500 mg by mouth 2 (two) times daily. (0800 & 2000), Disp: , Rfl:  .  Xylitol (XYLIMELTS) 500 MG DISK, Use as directed 1 capsule in the mouth or throat 3 (three) times daily., Disp: , Rfl:  .  zolpidem (AMBIEN) 5 MG tablet, Take 5 mg by mouth at bedtime. (2000), Disp: , Rfl:  .  bacitracin-polymyxin b (POLYSPORIN) ointment, Apply topically 3 (three) times daily. Apply to left earlobe, Disp: , Rfl:  .  diclofenac sodium (VOLTAREN) 1 % GEL, Apply topically., Disp: , Rfl:  .  diphenhydrAMINE (DIPHENHIST) 25 mg capsule,  Take 25 mg by mouth every 6 (six) hours as needed (for allergic reaction (notify provider is symptoms persist more than 24 hrs*)., Disp: , Rfl:  .  Emollient (EUCERIN INTENSIVE REPAIR) LOTN, Apply 1 application topically 2 (two) times daily., Disp: , Rfl:  .  guaifenesin (ROBITUSSIN) 100 MG/5ML syrup, Take 300 mg by mouth every 6 (six) hours as needed for cough. ,  Disp: , Rfl:  .  magnesium hydroxide (MILK OF MAGNESIA) 400 MG/5ML suspension, Take 30 mLs by mouth daily as needed (for constipation)., Disp: , Rfl:  .  Menthol-Zinc Oxide (GOLD BOND EX), Apply 1 application topically daily., Disp: , Rfl:  .  oxyCODONE (ROXICODONE) 5 MG immediate release tablet, Take 1 tablet (5 mg total) by mouth every 4 (four) hours as needed for severe pain. (Patient not taking: Reported on 05/23/2017), Disp: 40 tablet, Rfl: 0 .  pyrithione zinc (HEAD AND SHOULDERS) 1 % shampoo, Apply 1 application topically daily. t-gel or head and shoulders., Disp: , Rfl:  .  Skin Protectants, Misc. (EUCERIN) cream, Apply topically 2 (two) times daily., Disp: , Rfl:  .  spironolactone (ALDACTONE) 25 MG tablet, Take 25 mg by mouth daily. (0800), Disp: , Rfl:   ROS  Constitutional: Denies any fever or chills Gastrointestinal: No reported hemesis, hematochezia, vomiting, or acute GI distress Musculoskeletal: Denies any acute onset joint swelling, redness, loss of ROM, or weakness Neurological: No reported episodes of acute onset apraxia, aphasia, dysarthria, agnosia, amnesia, paralysis, loss of coordination, or loss of consciousness  Allergies  Ms. Gockley is allergic to doxycycline.  [place]  Drug: Ms. Ruland  reports that she does not use drugs. Alcohol:  reports that she does not drink alcohol. Tobacco:  reports that she quit smoking about 4 years ago. Her smoking use included cigarettes. She has a 75.00 pack-year smoking history. she has never used smokeless tobacco. Medical:  has a past medical history of Adenomatous colon polyp (08/27/2014), Anemia, Anxiety, Aortic dissection ([place]), Arthritis, Asthma, Bipolar affective disorder ([place]), Blood transfusion (2000), Blood transfusion without reported diagnosis, Cancer ([place]), Chronic abdominal pain, Cirrhosis ([place]), Colon polyp, Depression, Diabetes mellitus without complication ([place]), Fatty liver (04/09/08), GERD (gastroesophageal reflux  disease), H/O: CVA (cardiovascular accident), H/O: rheumatic fever, Hepatitis, Hyperlipidemia, Incontinence, Insomnia, Lower extremity edema, Obstructive sleep apnea, Platelets decreased ([place]), RLS (restless legs syndrome), Shortness of breath, Sleep apnea, Stroke ([place]), Ulcer, and Unspecified disorders of nervous system. Surgical: Ms. Crafton  has a past surgical history that includes Cholecystectomy; Rotator cuff repair; Abdominal hysterectomy; Lumbar fusion (10/09); Hemorroidectomy; Abdominal exploration surgery; Posterior cervical fusion/foraminotomy; Lumbar wound debridement (05/09/2011); Axillary artery cannulation via 8-mm Hemashield graft, median sternotomy, extracorporeal circulation with deep hypothermic circulatory arrest, repair of  aortic dessection (01/23/2009); cataract; Colonoscopy; Liver biopsy (04/10/2012); Back surgery; Eye surgery (Bilateral); and Knee arthroscopy with lateral release (Right, 11/15/2016). Family: family history includes Breast cancer in her mother; Diabetes in her unknown relative; Lung cancer in her mother; Stomach cancer in her father.  Constitutional Exam  General appearance: Well nourished, well developed, and well hydrated. In no apparent acute distress Vitals:   05/23/17 1150  BP: (!) 120/59  Pulse: 86  Resp: 16  Temp: 98 F (36.7 C)  TempSrc: Oral  SpO2: 96%  Weight: 220 lb (99.8 kg)  Height: _0  (1.6 m)   BMI Assessment: Estimated body mass index is 38.97 kg/m as calculated from the following:   Height as of this encounter: _1  (1.6 m).   Weight as of this encounter: 220  lb (99.8 kg).  BMI interpretation table: BMI level Category Range association with higher incidence of chronic pain  <18 kg/m2 Underweight   18.5-24.9 kg/m2 Ideal body weight   25-29.9 kg/m2 Overweight Increased incidence by 20%  30-34.9 kg/m2 Obese (Class I) Increased incidence by 68%  35-39.9 kg/m2 Severe obesity (Class II) Increased incidence by 136%  >40 kg/m2 Extreme  obesity (Class III) Increased incidence by 254%   BMI Readings from Last 4 Encounters:  05/23/17 38.97 kg/m  05/23/17 41.81 kg/m  04/23/17 38.97 kg/m  04/19/17 38.97 kg/m   Wt Readings from Last 4 Encounters:  05/23/17 220 lb (99.8 kg)  05/23/17 236 lb (107 kg)  04/23/17 220 lb (99.8 kg)  04/19/17 220 lb (99.8 kg)  Psych/Mental status: Alert, oriented x 3 (person, place, & time)       Eyes: PERLA Respiratory: No evidence of acute respiratory distress  Cervical Spine Area Exam  Skin & Axial Inspection: No masses, redness, edema, swelling, or associated skin lesions Alignment: Symmetrical Functional ROM: Unrestricted ROM      Stability: No instability detected Muscle Tone/Strength: Functionally intact. No obvious neuro-muscular anomalies detected. Sensory (Neurological): Unimpaired Palpation: No palpable anomalies              Upper Extremity (UE) Exam    Side: Right upper extremity  Side: Left upper extremity  Skin & Extremity Inspection: Skin color, temperature, and hair growth are WNL. No peripheral edema or cyanosis. No masses, redness, swelling, asymmetry, or associated skin lesions. No contractures.  Skin & Extremity Inspection: Skin color, temperature, and hair growth are WNL. No peripheral edema or cyanosis. No masses, redness, swelling, asymmetry, or associated skin lesions. No contractures.  Functional ROM: Unrestricted ROM          Functional ROM: Unrestricted ROM          Muscle Tone/Strength: Functionally intact. No obvious neuro-muscular anomalies detected.  Muscle Tone/Strength: Functionally intact. No obvious neuro-muscular anomalies detected.  Sensory (Neurological): Unimpaired          Sensory (Neurological): Unimpaired          Palpation: No palpable anomalies              Palpation: No palpable anomalies              Specialized Test(s): Deferred         Specialized Test(s): Deferred          Thoracic Spine Area Exam  Skin & Axial Inspection: No masses,  redness, or swelling Alignment: Symmetrical Functional ROM: Unrestricted ROM Stability: No instability detected Muscle Tone/Strength: Functionally intact. No obvious neuro-muscular anomalies detected. Sensory (Neurological): Unimpaired Muscle strength & Tone: No palpable anomalies Thoracic Spine Area Exam  Skin & Axial Inspection: Well healed scar from previous spine surgery detected Alignment: Asymmetric Functional ROM: Decreased ROM Stability: No instability detected Muscle Tone/Strength: Functionally intact. No obvious neuro-muscular anomalies detected. Sensory (Neurological): Movement associated pain Muscle strength & Tone: Complains of area being tender to palpation  Lumbar Spine Area Exam  Skin & Axial Inspection: Well healed scar from previous spine surgery detected Alignment: Levoscoliosis Functional ROM: Unrestricted ROM      Stability: No instability detected Muscle Tone/Strength: Functionally intact. No obvious neuro-muscular anomalies detected. Sensory (Neurological): Movement-associated pain Palpation: Complains of area being tender to palpation       Provocative Tests: Lumbar Hyperextension and rotation test: Positive       Lumbar Lateral bending test: Positive       Patrick's  Maneuver: Positive                    Gait & Posture Assessment  Ambulation: Patient ambulates using a walker Gait: Limited. Using assistive device to ambulate Posture: Antalgic   Lower Extremity Exam    Side: Right lower extremity  Side: Left lower extremity  Skin & Extremity Inspection: Skin color, temperature, and hair growth are WNL. No peripheral edema or cyanosis. No masses, redness, swelling, asymmetry, or associated skin lesions. No contractures.  Skin & Extremity Inspection: Skin color, temperature, and hair growth are WNL. No peripheral edema or cyanosis. No masses, redness, swelling, asymmetry, or associated skin lesions. No contractures.  Functional ROM: Unrestricted ROM           Functional ROM: Unrestricted ROM          Muscle Tone/Strength: Functionally intact. No obvious neuro-muscular anomalies detected.  Muscle Tone/Strength: Functionally intact. No obvious neuro-muscular anomalies detected.  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 Diagnoses of Fibromyalgia, S/P lumbar fusion, and Chronic pain syndrome were also pertinent to this visit.  Status Diagnosis  Persistent Persistent Persistent 1. 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 Fibromyalgia   3. S/P lumbar fusion   4. Chronic pain syndrome      General Recommendations: The pain condition that the patient suffers from is best treated with a multidisciplinary approach that involves an increase in physical activity to prevent de-conditioning and worsening of the pain cycle, as well as psychological counseling (formal and/or informal) to address the co-morbid psychological affects of pain. Treatment will often involve judicious use of pain medications and interventional procedures to decrease the pain, allowing the patient to participate in the physical activity that will ultimately produce long-lasting pain reductions. The goal of the multidisciplinary approach is to return the patient to a higher level of overall function and to restore their ability to perform activities of daily living.  Patient is a 66 year old female with multiple medical issues including history of type I aortic dissection, bipolar effective disorder, cirrhosis secondary to [facility], diabetes, hepatitis B over 20 years ago, history of CVA, obstructive sleep apnea, morbid obesity, restless leg syndrome who presents with multiple pain complaints including neck, bilateral arms, bilateral hands, thoracolumbar spine, hips, bilateral knees,  bilateral ankles. Patient does have a history of fibromyalgia as well as 5 lumbar spine surgeries from 2011 2 2015.  Patient is no longer on any benzodiazepine or opioid therapy which I agree with.  Patient endorses bilateral buttock pain and occasional groin pain.  She has positive Patrick's bilaterally.  This could be secondary to bilateral SI joint arthritis.  She also has tenderness to palpation overlying the region of her SI joint.  We discussed the risks and benefits of bilateral SI joint injection and patient would like to proceed.  Patient also endorses bilateral knee pain which is secondary to accelerated osteoarthritis from her morbid obesity.Patient has tried physical therapy in the past along with various NSAID therapy and topical's to the knee which were not very effective. She also states that she has had intra-articular knee injections which were moderately effective.  Of note patient will not be a candidate for opioid analgesics at our clinic given her risk factors of morbid obesity, psychiatric disease, obstructive sleep apnea.  Also chronic opioid therapy is not  an effective treatment modality for fibromyalgia.  Plan: -Bilateral SI joint injections for SI joint arthritis.  Patient instructed to stop aspirin 325 mg 5 days prior to scheduled procedure and take aspirin 81 mg instead..  -Continue physical therapy, diet modification for fibromyalgia management.  Continue Lyrica 150 mg 3 times a day for fibromyalgia.  Future: -Bilateral genicular nerve block for bilateral knee osteoarthritis.   Time Note: Greater than 50% of the 25 minute(s) of face-to-face time spent with Ms. Coles, was spent in counseling/coordination of care regarding: the appropriate use of the pain scale, the treatment plan, treatment alternatives, going over the informed consent and realistic expectations. Lab-work, procedure(s), and/or referral(s): Orders Placed This Encounter  Procedures  . SACROILIAC JOINT  INJECTION    Provider-requested follow-up: Return in about 2 weeks (around 06/06/2017) for Procedure.  Future Appointments  Date Time Provider [place]  05/24/2017  9:00 AM [facility]  06/15/2017  1:00 PM [facility]  06/27/2017 11:00 AM [place] Nakayama, MD [facility]  08/07/2017  1:45 PM Lequita Asal, MD [facility] None    Primary Care Physician: Sarajane Jews, MD Location: [facility] Note by: Gillis Santa, M.D Date: 05/23/2017; Time: 4:45 PM  Patient Instructions  Bilateral SI joint injection for SI joint arthralgia with sedation Patient to stop ASA 325 mg 5 days prior to procedure and take 81 mg instead

## 2017-05-23 NOTE — Progress Notes (Signed)
Patient left before being seen.

## 2017-05-23 NOTE — Progress Notes (Signed)
Safety precautions to be maintained throughout the outpatient stay will include: orient to surroundings, keep bed in low position, maintain call bell within reach at all times, provide assistance with transfer out of bed and ambulation.  

## 2017-05-23 NOTE — Patient Instructions (Signed)
Bilateral SI joint injection for SI joint arthralgia with sedation Patient to stop ASA 325 mg 5 days prior to procedure and take 81 mg instead

## 2017-05-24 ENCOUNTER — Ambulatory Visit
Admission: RE | Admit: 2017-05-24 | Discharge: 2017-05-24 | Disposition: A | Discharge status: Home / Self Care | Payer: Medicare Other | Source: Ambulatory Visit | Attending: Hematology and Oncology | Admitting: Hematology and Oncology

## 2017-05-24 DIAGNOSIS — J432 Centrilobular emphysema: Secondary | ICD-10-CM | POA: Insufficient documentation

## 2017-05-24 DIAGNOSIS — K7581 Nonalcoholic steatohepatitis (NASH): Secondary | ICD-10-CM | POA: Insufficient documentation

## 2017-05-24 DIAGNOSIS — Z79899 Other long term (current) drug therapy: Secondary | ICD-10-CM | POA: Insufficient documentation

## 2017-05-24 DIAGNOSIS — Z7982 Long term (current) use of aspirin: Secondary | ICD-10-CM | POA: Insufficient documentation

## 2017-05-24 DIAGNOSIS — G4733 Obstructive sleep apnea (adult) (pediatric): Secondary | ICD-10-CM | POA: Insufficient documentation

## 2017-05-24 DIAGNOSIS — Z8673 Personal history of transient ischemic attack (TIA), and cerebral infarction without residual deficits: Secondary | ICD-10-CM | POA: Insufficient documentation

## 2017-05-24 DIAGNOSIS — M533 Sacrococcygeal disorders, not elsewhere classified: Secondary | ICD-10-CM | POA: Insufficient documentation

## 2017-05-24 DIAGNOSIS — I6523 Occlusion and stenosis of bilateral carotid arteries: Secondary | ICD-10-CM | POA: Insufficient documentation

## 2017-05-24 DIAGNOSIS — X58XXXA Exposure to other specified factors, initial encounter: Secondary | ICD-10-CM | POA: Insufficient documentation

## 2017-05-24 DIAGNOSIS — M797 Fibromyalgia: Secondary | ICD-10-CM | POA: Insufficient documentation

## 2017-05-24 DIAGNOSIS — S83241A Other tear of medial meniscus, current injury, right knee, initial encounter: Secondary | ICD-10-CM | POA: Insufficient documentation

## 2017-05-24 DIAGNOSIS — I700 Atherosclerosis of aorta: Secondary | ICD-10-CM | POA: Insufficient documentation

## 2017-05-24 DIAGNOSIS — M1711 Unilateral primary osteoarthritis, right knee: Secondary | ICD-10-CM | POA: Insufficient documentation

## 2017-05-24 DIAGNOSIS — K219 Gastro-esophageal reflux disease without esophagitis: Secondary | ICD-10-CM | POA: Insufficient documentation

## 2017-05-24 DIAGNOSIS — I10 Essential (primary) hypertension: Secondary | ICD-10-CM | POA: Insufficient documentation

## 2017-05-24 DIAGNOSIS — G2581 Restless legs syndrome: Secondary | ICD-10-CM | POA: Insufficient documentation

## 2017-05-24 DIAGNOSIS — E1065 Type 1 diabetes mellitus with hyperglycemia: Secondary | ICD-10-CM | POA: Insufficient documentation

## 2017-05-24 DIAGNOSIS — Z66 Do not resuscitate: Secondary | ICD-10-CM | POA: Insufficient documentation

## 2017-05-24 DIAGNOSIS — F319 Bipolar disorder, unspecified: Secondary | ICD-10-CM | POA: Insufficient documentation

## 2017-05-24 DIAGNOSIS — Z794 Long term (current) use of insulin: Secondary | ICD-10-CM | POA: Insufficient documentation

## 2017-05-24 DIAGNOSIS — J45909 Unspecified asthma, uncomplicated: Secondary | ICD-10-CM | POA: Insufficient documentation

## 2017-05-24 DIAGNOSIS — K7460 Unspecified cirrhosis of liver: Secondary | ICD-10-CM | POA: Insufficient documentation

## 2017-05-24 DIAGNOSIS — D696 Thrombocytopenia, unspecified: Secondary | ICD-10-CM

## 2017-05-24 DIAGNOSIS — E785 Hyperlipidemia, unspecified: Secondary | ICD-10-CM | POA: Insufficient documentation

## 2017-05-24 DIAGNOSIS — R161 Splenomegaly, not elsewhere classified: Secondary | ICD-10-CM | POA: Insufficient documentation

## 2017-05-24 DIAGNOSIS — G894 Chronic pain syndrome: Secondary | ICD-10-CM | POA: Insufficient documentation

## 2017-05-24 DIAGNOSIS — G4700 Insomnia, unspecified: Secondary | ICD-10-CM | POA: Insufficient documentation

## 2017-05-24 DIAGNOSIS — D509 Iron deficiency anemia, unspecified: Secondary | ICD-10-CM | POA: Insufficient documentation

## 2017-05-24 DIAGNOSIS — E559 Vitamin D deficiency, unspecified: Secondary | ICD-10-CM | POA: Insufficient documentation

## 2017-05-24 DIAGNOSIS — I8392 Asymptomatic varicose veins of left lower extremity: Secondary | ICD-10-CM | POA: Insufficient documentation

## 2017-05-24 DIAGNOSIS — F419 Anxiety disorder, unspecified: Secondary | ICD-10-CM | POA: Insufficient documentation

## 2017-05-24 DIAGNOSIS — Z981 Arthrodesis status: Secondary | ICD-10-CM | POA: Insufficient documentation

## 2017-05-24 DIAGNOSIS — Z86010 Personal history of colonic polyps: Secondary | ICD-10-CM | POA: Insufficient documentation

## 2017-05-24 DIAGNOSIS — Z720 Tobacco use: Secondary | ICD-10-CM | POA: Insufficient documentation

## 2017-06-07 ENCOUNTER — Encounter: Payer: Self-pay | Admitting: Hematology and Oncology

## 2017-06-15 ENCOUNTER — Ambulatory Visit
Admission: RE | Admit: 2017-06-15 | Discharge: 2017-06-15 | Disposition: A | Discharge status: Home / Self Care | Payer: Medicare Other | Source: Ambulatory Visit | Attending: Physician Assistant | Admitting: Physician Assistant

## 2017-06-15 DIAGNOSIS — Z1231 Encounter for screening mammogram for malignant neoplasm of breast: Secondary | ICD-10-CM | POA: Diagnosis not present

## 2017-06-16 ENCOUNTER — Emergency Department: Payer: Medicare Other

## 2017-06-16 ENCOUNTER — Other Ambulatory Visit: Payer: Self-pay

## 2017-06-16 ENCOUNTER — Encounter: Payer: Self-pay | Admitting: Emergency Medicine

## 2017-06-16 ENCOUNTER — Emergency Department
Admission: EM | Admit: 2017-06-16 | Discharge: 2017-06-16 | Disposition: A | Discharge status: Home Health | Payer: Medicare Other | Attending: Student in an Organized Health Care Education/Training Program | Admitting: Student in an Organized Health Care Education/Training Program

## 2017-06-16 DIAGNOSIS — Z794 Long term (current) use of insulin: Secondary | ICD-10-CM | POA: Diagnosis not present

## 2017-06-16 DIAGNOSIS — E109 Type 1 diabetes mellitus without complications: Secondary | ICD-10-CM | POA: Insufficient documentation

## 2017-06-16 DIAGNOSIS — R0789 Other chest pain: Secondary | ICD-10-CM | POA: Insufficient documentation

## 2017-06-16 DIAGNOSIS — Z79899 Other long term (current) drug therapy: Secondary | ICD-10-CM | POA: Insufficient documentation

## 2017-06-16 DIAGNOSIS — J45909 Unspecified asthma, uncomplicated: Secondary | ICD-10-CM | POA: Diagnosis not present

## 2017-06-16 DIAGNOSIS — Z7982 Long term (current) use of aspirin: Secondary | ICD-10-CM | POA: Diagnosis not present

## 2017-06-16 DIAGNOSIS — Z87891 Personal history of nicotine dependence: Secondary | ICD-10-CM | POA: Insufficient documentation

## 2017-06-16 DIAGNOSIS — I10 Essential (primary) hypertension: Secondary | ICD-10-CM | POA: Insufficient documentation

## 2017-06-16 DIAGNOSIS — R079 Chest pain, unspecified: Secondary | ICD-10-CM | POA: Diagnosis present

## 2017-06-16 LAB — CBC
HEMATOCRIT: 40.1 % (ref 35.0–47.0)
HEMOGLOBIN: 13.6 g/dL (ref 12.0–16.0)
MCH: 31.9 pg (ref 26.0–34.0)
MCHC: 33.8 g/dL (ref 32.0–36.0)
MCV: 94.4 fL (ref 80.0–100.0)
Platelets: 110 10*3/uL — ABNORMAL LOW (ref 150–440)
RBC: 4.25 MIL/uL (ref 3.80–5.20)
RDW: 16.3 % — ABNORMAL HIGH (ref 11.5–14.5)
WBC: 10.2 10*3/uL (ref 3.6–11.0)

## 2017-06-16 LAB — BASIC METABOLIC PANEL
ANION GAP: 10 (ref 5–15)
BUN: 21 mg/dL — ABNORMAL HIGH (ref 6–20)
CO2: 30 mmol/L (ref 22–32)
Calcium: 8.9 mg/dL (ref 8.9–10.3)
Chloride: 99 mmol/L — ABNORMAL LOW (ref 101–111)
Creatinine, Ser: 0.83 mg/dL (ref 0.44–1.00)
Glucose, Bld: 166 mg/dL — ABNORMAL HIGH (ref 65–99)
POTASSIUM: 3.8 mmol/L (ref 3.5–5.1)
Sodium: 139 mmol/L (ref 135–145)

## 2017-06-16 LAB — FIBRIN DERIVATIVES D-DIMER (ARMC ONLY): FIBRIN DERIVATIVES D-DIMER (ARMC): 320.57 ng{FEU}/mL (ref 0.00–499.00)

## 2017-06-16 LAB — TROPONIN I
Troponin I: <0.03 ng/mL (ref <0.03)
Troponin I: <0.03 ng/mL (ref <0.03)

## 2017-06-16 MED ORDER — CYCLOBENZAPRINE HCL 10 MG PO TABS: 10.0000 mg | ORAL_TABLET | Freq: Three times a day (TID) | ORAL | 0 refills | Status: DC | PRN

## 2017-06-16 MED ORDER — SODIUM CHLORIDE 0.9 % IV BOLUS (SEPSIS)
500.0000 mL | Freq: Once | INTRAVENOUS | Status: AC
  Administered 2017-06-16: 500 mL via INTRAVENOUS

## 2017-06-16 MED ORDER — LIDOCAINE 5 % EX PTCH
1.0000 | MEDICATED_PATCH | CUTANEOUS | Status: DC
  Administered 2017-06-16: 1 via TRANSDERMAL
  Filled 2017-06-16: qty 1

## 2017-06-16 MED ORDER — OXYCODONE-ACETAMINOPHEN 5-325 MG PO TABS
1.0000 | ORAL_TABLET | Freq: Once | ORAL | Status: AC
  Administered 2017-06-16: 1 via ORAL
  Filled 2017-06-16: qty 1

## 2017-06-16 MED ORDER — LIDOCAINE 5 % EX PTCH: 1.0000 | MEDICATED_PATCH | Freq: Two times a day (BID) | CUTANEOUS | 0 refills | Status: DC

## 2017-06-16 NOTE — ED Triage Notes (Signed)
Pt to ED via [facility] from [facility]. Pt states that she has been having chest pain on and off all day. Pt states that she is also having shortness of breath. Pt states that her neck has been really stiff in the front, this started around the same time as the chest pain. Pt states that it feels like her neck is swollen. Pt appears to be sleepy but denies taking any medications that would make her sleepy. Pt in NAD at this time.  Pt states that she does have nausea but no vomiting. Pt has left sided neck tenderness on palpation.

## 2017-06-16 NOTE — ED Notes (Signed)
Pt resting quietly, in w/c, no distress noted, pt awakened with gentle shaking and name calling, cont to monitor

## 2017-06-16 NOTE — ED Provider Notes (Signed)
[facility] Emergency Department Provider Note    First MD Initiated Contact with Patient 06/16/17 2021     (approximate)  I have reviewed the triage vital signs and the nursing notes.   HISTORY  Chief Complaint Chest Pain    HPI Stephanie [name] is a 67 y.o. female is a history of anxiety as well as bipolar disorder aortic dissection with no recent hospitalizations presents ER with 1 day of intermittent chest pain that became acutely worse at 4 PM.  No associated diaphoresis or nausea.  States that it radiates along her left chest wall.  Does hurt to take a deep breath.  No worsening lower extremity swelling.  Is not on any blood thinners.  No history of blood clots.  Denies any shortness of breath at rest.  No fevers.  Does have pain is reproduced with palpation of the left pectoralis muscle.  Is never had pain like this before.  Past Medical History:  Diagnosis Date  . Adenomatous colon polyp 08/27/2014   Polyps x 3  . Anemia   . Anxiety   . 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 Arthritis   . Asthma   . Bipolar affective disorder ([place])    h/o  . Blood transfusion 2000  . Blood transfusion without reported diagnosis   . Cancer ([place])   . Chronic abdominal pain    abdominal wall pain  . 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 Colon polyp   . Depression   . Diabetes mellitus without complication ([place])   . Fatty liver 04/09/08   found in abd CT  . GERD (gastroesophageal reflux disease)   . H/O: CVA (cardiovascular accident)    TIA  . H/O: rheumatic fever   . Hepatitis    HEP "B" twenty years ago  . Hyperlipidemia   . Incontinence   . Insomnia   . Lower extremity edema   . Obstructive sleep apnea    Use C-PAP twice per week  . Platelets decreased ([place])   . RLS (restless legs syndrome)   . Shortness of breath   . Sleep apnea   . Stroke [facility])    TIA- 2002  . Ulcer   . Unspecified disorders of nervous system    Family History  Problem  Relation Age of Onset  . Breast cancer Mother   . Lung cancer Mother   . Stomach cancer Father   . Diabetes Unknown        4 aunts, and 1 uncle  . Colon cancer Neg Hx   . Esophageal cancer Neg Hx   . Rectal cancer Neg Hx    Past Surgical History:  Procedure Laterality Date  . ABDOMINAL EXPLORATION SURGERY    . ABDOMINAL HYSTERECTOMY     total  . Axillary artery cannulation via 8-mm Hemashield graft, median sternotomy, extracorporeal circulation with deep hypothermic circulatory arrest, repair of  aortic dessection  01/23/2009   Dr Arlyce Dice  . BACK SURGERY      x 5  . cataract     both eyes  . CHOLECYSTECTOMY    . COLONOSCOPY    . EYE SURGERY Bilateral    Cataract Extraction with IOL  . HEMORROIDECTOMY     and colon polyp removed  . KNEE ARTHROSCOPY WITH LATERAL RELEASE Right 11/15/2016   Procedure: KNEE ARTHROSCOPY WITH LATERAL RELEASE;  Surgeon: Corky Mull, MD;  Location: [facility] ORS;  Service: Orthopedics;  Laterality:  Right;  Marland Kitchen LIVER BIOPSY  04/10/2012   Procedure: LIVER BIOPSY;  Surgeon: Inda Castle, MD;  Location: [facility] ENDOSCOPY;  Service: Endoscopy;  Laterality: N/A;  ultrasound to mark order for abd limited/liver to be marked to be sent by [email or web address]   . LUMBAR FUSION  10/09  . LUMBAR WOUND DEBRIDEMENT  05/09/2011   Procedure: LUMBAR WOUND DEBRIDEMENT;  Surgeon: Dahlia Bailiff;  Location: [facility];  Service: Orthopedics;  Laterality: N/A;  IRRIGATION AND DEBRIDEMENT SPINAL WOUND  . POSTERIOR CERVICAL FUSION/FORAMINOTOMY    . ROTATOR CUFF REPAIR     left   Patient Active Problem List   Diagnosis Date Noted  . Elevated alkaline phosphatase level 04/16/2017  . Altered mental status 03/07/2017  . Degenerative tear of lateral meniscus of right knee 11/15/2016  . Encephalopathy 07/19/2016  . Degenerative tear of medial meniscus of right knee 07/01/2016  . Primary osteoarthritis of right knee 07/01/2016  . Diabetes mellitus type 1, uncontrolled, without complications ([place])  50/35/4656  . Lymphedema 06/14/2016  . Pain in limb 05/06/2016  . Swelling of limb 05/06/2016  . Postphlebitic syndrome with inflammation 05/06/2016  . Pneumonia 04/18/2016  . Chest pain 04/04/2016  . Iron deficiency anemia 01/30/2016  . Bilateral carotid artery stenosis 01/12/2016  . Hypoglycemia 09/09/2015  . Hyperglycemia 07/16/2015  . Neuropathy 06/08/2015  . Thrombocytopenia ([place]) 08/19/2014  . Abdominal pain, chronic, epigastric 06/12/2014  . Liver cirrhosis secondary to NASH ([place]) 02/27/2014  . Dizziness and giddiness 10/30/2013  . DOE (dyspnea on exertion) 10/30/2013  . Poorly controlled type 2 diabetes mellitus with complication ([place]) 81/27/5170  . DNR (do not resuscitate) 07/29/2013  . Encounter for routine gynecological examination 07/29/2013  . Dyspnea 07/10/2013  . Lung nodule 07/10/2013  . Varicose veins of left lower extremity 10/09/2012  . GERD (gastroesophageal reflux disease) 10/09/2012  . Cirrhosis of liver ([place]) 02/02/2012  . Anxiety 07/11/2011  . Aortic dissection ([place])   . BACK PAIN, LUMBAR, CHRONIC 11/19/2009  . UNS ADVRS EFF OTH RX MEDICINAL&BIOLOGICAL SBSTNC 12/16/2008  . UNSPECIFIED VITAMIN D DEFICIENCY 09/26/2008  . TOBACCO ABUSE 06/19/2008  . RESTLESS LEGS SYNDROME 04/27/2007  . INSOMNIA 04/27/2007  . HLD (hyperlipidemia) 03/08/2007  . OSA (obstructive sleep apnea) 03/08/2007  . Essential hypertension 03/08/2007  . Asthma 03/08/2007  . BIPOLAR AFFECTIVE DISORDER, HX OF 03/08/2007      Prior to Admission medications   Medication Sig Start Date End Date Taking? Authorizing Provider  albuterol (PROVENTIL HFA;VENTOLIN HFA) 108 (90 Base) MCG/ACT inhaler Inhale 2 puffs into the lungs every 6 (six) hours as needed for wheezing or shortness of breath. 07/28/16   Alfred Levins, [place], MD  albuterol (PROVENTIL) (2.5 MG/3ML) 0.083% nebulizer solution Take 2.5 mg by nebulization every 6 (six) hours as needed for wheezing or shortness of breath.    [provider]  alum & mag hydroxide-simeth (ANTACID) 200-200-20 MG/5ML suspension Take 30 mLs by mouth every 4 (four) hours as needed for indigestion or heartburn.    [provider]  aspirin 325 MG tablet Take 325 mg by mouth daily.     [provider]  atorvastatin (LIPITOR) 40 MG tablet  03/07/17   [provider]  bacitracin-polymyxin b (POLYSPORIN) ointment Apply topically 3 (three) times daily. Apply to left earlobe    [provider]  barrier cream (NON-SPECIFIED) CREA Apply 1 application topically as needed (APPLIED AFTER TOILETING TO PREVENT SKIN BREAKDOWN).    [provider]  budesonide-formoterol (SYMBICORT) 160-4.5 MCG/ACT inhaler Inhale 2 puffs  into the lungs 2 (two) times daily. (0800 & 2000)    [provider]  buPROPion (WELLBUTRIN SR) 150 MG 12 hr tablet Take 150 mg by mouth daily. (0800)    [provider]  clonazePAM (KLONOPIN) 0.5 MG tablet Take by mouth.    [provider]  diclofenac sodium (VOLTAREN) 1 % GEL Apply topically. 02/02/17   [provider]  dicyclomine (BENTYL) 10 MG capsule Take 10 mg by mouth 3 (three) times daily. (0800, 1300 & 2000)    [provider]  diphenhydrAMINE (DIPHENHIST) 25 mg capsule Take 25 mg by mouth every 6 (six) hours as needed (for allergic reaction (notify provider is symptoms persist more than 24 hrs*).    [provider]  Emollient (EUCERIN INTENSIVE REPAIR) LOTN Apply 1 application topically 2 (two) times daily.    [provider]  ferrous sulfate 325 (65 FE) MG tablet Take 325 mg by mouth 2 (two) times daily. (0800 & 2000)    [provider]  furosemide (LASIX) 80 MG tablet Take 80 mg by mouth daily. (0800)    [provider]  gabapentin (NEURONTIN) 300 MG capsule Take 900 mg by mouth at bedtime.    [provider]  gabapentin (NEURONTIN) 600 MG tablet Take 600 mg by mouth daily. Take 600 mg by mouth in the  morning and 900 mg by mouth at bedtime.    [provider]  guaifenesin (ROBITUSSIN) 100 MG/5ML syrup Take 300 mg by mouth every 6 (six) hours as needed for cough.     [provider]  insulin glargine (LANTUS) 100 UNIT/ML injection Inject 28 Units into the skin at bedtime.    [provider]  insulin lispro (HUMALOG KWIKPEN) 100 UNIT/ML KiwkPen Inject 0-12 Units into the skin 3 (three) times daily. Check FSBS before each meal & cover with SSI (sliding scale insulin)     [provider]  lactulose (CHRONULAC) 10 GM/15ML solution Take 10 g by mouth every Monday, Wednesday, and Friday.  10/17/16   [provider]  lamoTRIgine (LAMICTAL) 100 MG tablet Take 100 mg by mouth 2 (two) times daily. (0800 & 2000)    [provider]  lidocaine (LIDODERM) 5 % Place 3 patches onto the skin daily. Apply to back 02/01/17   [provider]  loperamide (IMODIUM) 2 MG capsule Take 4 mg by mouth as needed for diarrhea or loose stools. *Do not exceed 8 doses in 24 hours*    [provider]  LYRICA 150 MG capsule Take 150 mg by mouth 3 (three) times daily. 11/29/16   [provider]  magnesium hydroxide (MILK OF MAGNESIA) 400 MG/5ML suspension Take 30 mLs by mouth daily as needed (for constipation).    [provider]  Menthol-Zinc Oxide (GOLD BOND EX) Apply 1 application topically daily.    [provider]  metoCLOPramide (REGLAN) 5 MG tablet Take 5 mg by mouth 4 (four) times daily -  before meals and at bedtime. (0800, 1200, 1600 & 2000)    [provider]  mirtazapine (REMERON) 45 MG tablet Take 45 mg by mouth at bedtime. (2000)     [provider]  omeprazole (PRILOSEC) 20 MG capsule Take by mouth. 04/16/14   [provider]  ondansetron (ZOFRAN) 4 MG tablet Take 4 mg by mouth every 4 (four) hours as needed for nausea or vomiting.    [provider]  oxybutynin (DITROPAN-XL) 5 MG 24 hr  tablet Take 5 mg  by mouth daily. (0800)    [provider]  oxyCODONE (ROXICODONE) 5 MG immediate release tablet Take 1 tablet (5 mg total) by mouth every 4 (four) hours as needed for severe pain. Patient not taking: Reported on 05/23/2017 03/11/17   Henreitta Leber, MD  potassium chloride SA (K-DUR,KLOR-CON) 20 MEQ tablet Take 20 mEq by mouth daily. (0800)    [provider]  pyrithione zinc (HEAD AND SHOULDERS) 1 % shampoo Apply 1 application topically daily. t-gel or head and shoulders.    [provider]  rOPINIRole (REQUIP) 3 MG tablet Take 3 mg by mouth at bedtime. (2000)    [provider]  simvastatin (ZOCOR) 10 MG tablet Take 10 mg by mouth at bedtime. (2000)    [provider]  Skin Protectants, Misc. (EUCERIN) cream Apply topically 2 (two) times daily.    [provider]  spironolactone (ALDACTONE) 25 MG tablet Take 25 mg by mouth daily. (0800)    [provider]  vitamin C (ASCORBIC ACID) 500 MG tablet Take 500 mg by mouth 2 (two) times daily. (0800 & 2000)    [provider]  Xylitol (XYLIMELTS) 500 MG DISK Use as directed 1 capsule in the mouth or throat 3 (three) times daily.    [provider]  zolpidem (AMBIEN) 5 MG tablet Take 5 mg by mouth at bedtime. (2000)    [provider]    Allergies Doxycycline    Social History Social History   Tobacco Use  . Smoking status: Former Smoker    Packs/day: 1.50    Years: 50.00    Pack years: 75.00    Types: Cigarettes    Last attempt to quit: 04/06/2013    Years since quitting: 4.1  . Smokeless tobacco: Never Used  Substance Use Topics  . Alcohol use: No    Alcohol/week: 0.0 oz  . Drug use: No    Review of Systems Patient denies headaches, rhinorrhea, blurry vision, numbness, shortness of breath, chest pain, edema, cough, abdominal pain, nausea, vomiting, diarrhea, dysuria, fevers, rashes or hallucinations unless otherwise stated above  in HPI. ____________________________________________   PHYSICAL EXAM:  VITAL SIGNS: Vitals:   06/16/17 1701  BP: (!) 128/51  Pulse: 71  Resp: 16  Temp: 97.7 F (36.5 C)  SpO2: 94%    Constitutional: Alert and oriented. Well appearing and in no acute distress. Eyes: Conjunctivae are normal.  Head: Atraumatic. Nose: No congestion/rhinnorhea. Mouth/Throat: Mucous membranes are moist.   Neck: No stridor. Painless ROM.  Cardiovascular: Normal rate, regular rhythm. Grossly normal heart sounds.  Good peripheral circulation. Respiratory: Normal respiratory effort.  No retractions. Lungs CTAB. Gastrointestinal: Soft and nontender. No distention. No abdominal bruits. No CVA tenderness. Musculoskeletal: left chest wall pain with palpation along pectoralis muscles No lower extremity tenderness nor edema.  No joint effusions. Neurologic:  Normal speech and language. No gross focal neurologic deficits are appreciated. No facial droop Skin:  Skin is warm, dry and intact. No rash noted. Psychiatric: Mood and affect are normal. Speech and behavior are normal.  ____________________________________________   LABS (all labs ordered are listed, but only abnormal results are displayed)  Results for orders placed or performed during the hospital encounter of 06/16/17 (from the past 24 hour(s))  Basic metabolic panel     Status: Abnormal   Collection Time: 06/16/17  4:59 PM  Result Value Ref Range   Sodium 139 135 - 145 mmol/L   Potassium 3.8 3.5 - 5.1 mmol/L  Chloride 99 (L) 101 - 111 mmol/L   CO2 30 22 - 32 mmol/L   Glucose, Bld 166 (H) 65 - 99 mg/dL   BUN 21 (H) 6 - 20 mg/dL   Creatinine, Ser 0.83 0.44 - 1.00 mg/dL   Calcium 8.9 8.9 - 10.3 mg/dL   GFR calc non Af Amer >60 >60 mL/min   GFR calc Af Amer >60 >60 mL/min   Anion gap 10 5 - 15  CBC     Status: Abnormal   Collection Time: 06/16/17  4:59 PM  Result Value Ref Range   WBC 10.2 3.6 - 11.0 K/uL   RBC 4.25 3.80 - 5.20 MIL/uL    Hemoglobin 13.6 12.0 - 16.0 g/dL   HCT 40.1 35.0 - 47.0 %   MCV 94.4 80.0 - 100.0 fL   MCH 31.9 26.0 - 34.0 pg   MCHC 33.8 32.0 - 36.0 g/dL   RDW 16.3 (H) 11.5 - 14.5 %   Platelets 110 (L) 150 - 440 K/uL  Troponin I     Status: None   Collection Time: 06/16/17  4:59 PM  Result Value Ref Range   Troponin I <0.03 <0.03 ng/mL  Fibrin derivatives D-Dimer ([facility] only)     Status: None   Collection Time: 06/16/17  8:41 PM  Result Value Ref Range   Fibrin derivatives D-dimer ([facility]) 320.57 0.00 - 499.00 ng/mL (FEU)  Troponin I     Status: None   Collection Time: 06/16/17  8:41 PM  Result Value Ref Range   Troponin I <0.03 <0.03 ng/mL   *Note: Due to a large number of results and/or encounters for the requested time period, some results have not been displayed. A complete set of results can be found in Results Review.   ____________________________________________  EKG My review and personal interpretation at Time: 16:57   Indication: chest pain  Rate: 70  Rhythm: sinus  Axis: normal Other: normal intervals, no stemi, no evidence of acute ischemia ____________________________________________  RADIOLOGY  I personally reviewed all radiographic images ordered to evaluate for the above acute complaints and reviewed radiology reports and findings.  These findings were personally discussed with the patient.  Please see medical record for radiology report.  ____________________________________________   PROCEDURES  Procedure(s) performed:  Procedures    Critical Care performed: no ____________________________________________   INITIAL IMPRESSION / ASSESSMENT AND PLAN / ED COURSE  Pertinent labs & imaging results that were available during my care of the patient were reviewed by me and considered in my medical decision making (see chart for details).  DDX: ACS, pericarditis, esophagitis, boerhaaves, pe, dissection, pna, bronchitis, costochondritis   Stephanie [name] is a 56 y.o.  who presents to the ED with chest pain as described above.  Seems primarily musculoskeletal patient at high risk for CAD as well as having pleuritic chest pain at age 74.  Patient also with extensive history of chronic pain syndrome anxiety and bipolar disorder.  Does not appear clinically consistent with dissection.  No evidence of pneumonia.  Blood work is reassuring.  Will order d-dimer to further risk stratify as she is low risk by Wells criteria.  Will repeat troponin to further risk stratify for ACS.    ----------------------------------------- 11:18 PM on 06/16/2017 -----------------------------------------  Repeat troponin and d-dimer both negative.  Patient's pain is improved.  Do suspect some component of costochondritis or musculoskeletal pain.  Will give patient referral to outpatient cardiology.  Have discussed with the patient and available family all diagnostics  and treatments performed thus far and all questions were answered to the best of my ability. The patient demonstrates understanding and agreement with plan.   ____________________________________________   FINAL CLINICAL IMPRESSION(S) / ED DIAGNOSES  Final diagnoses:  Chest wall pain      NEW MEDICATIONS STARTED DURING THIS VISIT:  New Prescriptions   No medications on file     Note:  This document was prepared using Dragon voice recognition software and may include unintentional dictation errors.    Merlyn Lot, MD 06/16/17 [phone]

## 2017-06-27 ENCOUNTER — Ambulatory Visit: Payer: Medicare Other | Admitting: Thoracic Surgery (Cardiothoracic Vascular Surgery)

## 2017-06-27 ENCOUNTER — Other Ambulatory Visit: Payer: Medicare Other

## 2017-06-30 ENCOUNTER — Other Ambulatory Visit: Payer: Medicare Other

## 2017-07-04 ENCOUNTER — Encounter (INDEPENDENT_AMBULATORY_CARE_PROVIDER_SITE_OTHER): Payer: Self-pay | Admitting: Vascular Surgery

## 2017-07-04 ENCOUNTER — Ambulatory Visit (INDEPENDENT_AMBULATORY_CARE_PROVIDER_SITE_OTHER): Payer: Medicare Other | Admitting: Vascular Surgery

## 2017-07-04 VITALS — BP 122/74 | HR 77 | Resp 16 | Wt 233.6 lb

## 2017-07-04 DIAGNOSIS — I87029 Postthrombotic syndrome with inflammation of unspecified lower extremity: Secondary | ICD-10-CM

## 2017-07-04 DIAGNOSIS — K7460 Unspecified cirrhosis of liver: Secondary | ICD-10-CM | POA: Diagnosis not present

## 2017-07-04 DIAGNOSIS — I10 Essential (primary) hypertension: Secondary | ICD-10-CM | POA: Diagnosis not present

## 2017-07-04 DIAGNOSIS — E1165 Type 2 diabetes mellitus with hyperglycemia: Secondary | ICD-10-CM

## 2017-07-04 DIAGNOSIS — E118 Type 2 diabetes mellitus with unspecified complications: Secondary | ICD-10-CM

## 2017-07-04 NOTE — Progress Notes (Signed)
MRN : [id number]  Stephanie [name] is a 67 y.o. (Jul 31, 1950) female who presents with chief complaint of  Chief Complaint  Patient presents with  . Follow-up    [id number] up  .  History of Present Illness: Patient returns today in follow up of leg swelling and lymphedema.  She has been wearing her compression stockings about half the time.  She has not been using her lymphedema pump.  Her leg swelling is about the same and may be slightly worse than it was at her last visit about 9 months ago.  No new ulceration or infection.  No fever or chills.      Past Medical History:  Diagnosis Date  . Adenomatous colon polyp 08/27/2014   Polyps x 3  . Anemia   . Anxiety   . 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 Arthritis   . Asthma   . Bipolar affective disorder ([facility]    h/o  . Blood transfusion 2000  . Blood transfusion without reported diagnosis   . Chronic abdominal pain    abdominal wall pain  . 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 Colon polyp   . Depression   . Diabetes mellitus without complication ([facility]   . Fatty liver 04/09/08   found in abd CT  . GERD (gastroesophageal reflux disease)   . H/O: CVA (cardiovascular accident)    TIA  . H/O: rheumatic fever   . Hyperlipidemia   . Incontinence   . Insomnia   . Obstructive sleep apnea    not using CPAP, last sleep study 2.5 years ago, per pt doctor is aware  . Platelets decreased ([place]   . RLS (restless legs syndrome)   . Shortness of breath   . Sleep apnea   . Stroke ([facility]    TIA- 2002  . Ulcer ([facility]   . Unspecified disorders of nervous system          Past Surgical History:  Procedure Laterality Date  . ABDOMINAL EXPLORATION SURGERY    . ABDOMINAL HYSTERECTOMY     total  . Axillary artery cannulation via 8-mm Hemashield graft, median sternotomy, extracorporeal circulation with deep hypothermic circulatory arrest, repair of aortic dessection  01/23/2009    Dr BArlyce Dice . BACK SURGERY     x 45  . cataract     both eyes  . CHOLECYSTECTOMY    . COLONOSCOPY    . EYE SURGERY     bilat  . HEMORROIDECTOMY     and colon polyp removed  . LIVER BIOPSY  04/10/2012   Procedure: LIVER BIOPSY; Surgeon: RInda Castle MD; Location: [facility]; Service: Endoscopy; Laterality: N/A; ultrasound to mark order for abd limited/liver to be marked to be sent by [email or web address]   . LUMBAR FUSION  10/09  . LUMBAR WOUND DEBRIDEMENT  05/09/2011   Procedure: LUMBAR WOUND DEBRIDEMENT; Surgeon: DDahlia Bailiff [facility]: [facility] Service: Orthopedics; Laterality: N/A; IRRIGATION AND DEBRIDEMENT SPINAL WOUND  . POSTERIOR CERVICAL FUSION/FORAMINOTOMY    . ROTATOR CUFF REPAIR     left          Family History  Problem Relation Age of Onset  . Breast cancer Mother   . Lung cancer Mother   . Stomach cancer Father   . Diabetes      4 aunts, and 1 uncle  . Colon cancer Neg Hx   . Esophageal cancer Neg Hx   . Rectal cancer Neg  Hx      Social History      Social History  Substance Use Topics  . Smoking status: Former Smoker    Packs/day: 1.50    Years: 50.00    Types: Cigarettes    Quit date: 04/06/2013  . Smokeless tobacco: Never Used  . Alcohol use No  Lives in a facility       Allergies  Allergen Reactions  . Doxycycline Hives, Swelling and Other (See Comments)    Reaction: Facial swelling           Current Outpatient Prescriptions  Medication Sig Dispense Refill  . acetaminophen (TYLENOL) 325 MG tablet Take 650 mg by mouth every 6 (six) hours as needed.    Marland Kitchen albuterol (VENTOLIN HFA) 108 (90 Base) MCG/ACT inhaler Inhale 2 puffs into the lungs every 6 (six) hours as needed for wheezing or shortness of breath. 1 Inhaler 11  . ARIPiprazole (ABILIFY) 30 MG tablet Take 30 mg by mouth daily.    . budesonide-formoterol (SYMBICORT) 160-4.5 MCG/ACT inhaler Inhale 2 puffs  into the lungs 2 (two) times daily.    Marland Kitchen buPROPion (WELLBUTRIN SR) 150 MG 12 hr tablet Take 150 mg by mouth daily.    . clonazePAM (KLONOPIN) 0.5 MG tablet Take 0.5 mg by mouth 2 (two) times daily.    Marland Kitchen dicyclomine (BENTYL) 10 MG capsule Take 10 mg by mouth 3 (three) times daily.     . ferrous sulfate 325 (65 FE) MG tablet Take 325 mg by mouth 2 (two) times daily.     . furosemide (LASIX) 80 MG tablet Take 80 mg by mouth daily.    Marland Kitchen gabapentin (NEURONTIN) 600 MG tablet Take 600 mg by mouth 2 (two) times daily.     . insulin glargine (LANTUS) 100 UNIT/ML injection Inject 20 Units into the skin at bedtime.    . insulin lispro (HUMALOG) 100 UNIT/ML injection Inject 0-9 Units into the skin 3 (three) times daily before meals. Pt uses as needed per sliding scale:   150-200: 0 units  201-250: 5 units  251-300: 8 units 301-350: 9 units  Greater than 350: Call MD    . lactulose (CHRONULAC) 10 GM/15ML solution Take 10 g by mouth daily.    Marland Kitchen lamoTRIgine (LAMICTAL) 100 MG tablet Take 100 mg by mouth 2 (two) times daily.    Marland Kitchen loperamide (IMODIUM) 2 MG capsule Take 4 mg by mouth as needed for diarrhea or loose stools. *Do not exceed 8 doses in 24 hours*    . LORazepam (ATIVAN) 0.5 MG tablet Take 0.5 mg by mouth daily as needed for anxiety.     . metFORMIN (GLUCOPHAGE) 500 MG tablet Take 500 mg by mouth daily. Take 2 tablets (1000 mg) by mouth every morning with meal, and take 1 tablet by mouth every evening at 5 pm with meal.    . metoCLOPramide (REGLAN) 5 MG tablet Take 5 mg by mouth 4 (four) times daily. Before meals and at bedtime    . mirtazapine (REMERON) 15 MG tablet Take 15 mg by mouth at bedtime.    . Multiple Vitamin (MULTIVITAMIN WITH MINERALS) TABS tablet Take 1 tablet by mouth daily with breakfast.     . omeprazole (PRILOSEC) 40 MG capsule Take 40 mg by mouth daily before breakfast.    . oxybutynin (DITROPAN-XL) 5 MG 24 hr tablet Take 5 mg by mouth  daily.    Marland Kitchen oxyCODONE (OXY IR/ROXICODONE) 5 MG immediate release tablet Take 5 mg by mouth  every 12 (twelve) hours as needed for severe pain.     . pioglitazone (ACTOS) 30 MG tablet Take 30 mg by mouth daily.    . potassium chloride SA (K-DUR,KLOR-CON) 20 MEQ tablet Take 20 mEq by mouth daily. *Note dose*    . pregabalin (LYRICA) 150 MG capsule Take 150 mg by mouth 3 (three) times daily.     Marland Kitchen rOPINIRole (REQUIP) 3 MG tablet Take 3 mg by mouth at bedtime.    . senna (SENOKOT) 8.6 MG tablet Take 1 tablet by mouth 3 (three) times a week. Take on Monday, Wednesday, and Friday. *Hold for loose stools*    . simvastatin (ZOCOR) 10 MG tablet Take 10 mg by mouth at bedtime.     Marland Kitchen spironolactone (ALDACTONE) 25 MG tablet Take 25 mg by mouth daily.    . traMADol (ULTRAM) 50 MG tablet Take 50 mg by mouth every 6 (six) hours as needed for moderate pain.    . vitamin C (ASCORBIC ACID) 500 MG tablet Take 500 mg by mouth 2 (two) times daily.    Marland Kitchen zolpidem (AMBIEN) 5 MG tablet Take 5 mg by mouth at bedtime.      No current facility-administered medications for this visit.       REVIEW OF SYSTEMS(Negative unless checked)  Constitutional: [] Weight loss[] Fever[] Chills Cardiac:[] Chest pain[] Chest pressure[x] Palpitations [] Shortness of breath when laying flat [] Shortness of breath at rest [] Shortness of breath with exertion. Vascular: [] Pain in legs with walking[x] Pain in legsat rest[] Pain in legs when laying flat [] Claudication [] Pain in feet when walking [] Pain in feet at rest [] Pain in feet when laying flat [x] History of DVT [x] Phlebitis [x] Swelling in legs [] Varicose veins [] Non-healing ulcers Pulmonary: [] Uses home oxygen [] Productive cough[] Hemoptysis [] Wheeze [] COPD [] Asthma Neurologic: [] Dizziness [] Blackouts [] Seizures [] History of stroke [] History of TIA[] Aphasia [] Temporary blindness[] Dysphagia  [] Weaknessor numbness in arms [x] Weakness or numbnessin legs Musculoskeletal: [x] Arthritis [] Joint swelling [x] Joint pain [] Low back pain Hematologic:[] Easy bruising[] Easy bleeding [] Hypercoagulable state [] Anemic [] Hepatitis Gastrointestinal:[] Blood in stool[] Vomiting blood[] Gastroesophageal reflux/heartburn[] Abdominal pain Genitourinary: [] Chronic kidney disease [] Difficulturination [] Frequenturination [] Burning with urination[] Hematuria Skin: [] Rashes [] Ulcers [] Wounds Psychological: [x] History of anxiety[] History of major depression.      Physical Examination  BP 122/74 (BP Location: Left Arm)   Pulse 77   Resp 16   Wt 233 lb 9.6 oz (106 kg)   BMI 41.38 kg/m  Gen:  WD/WN, NAD Head: [place]/AT, No temporalis wasting. Ear/Nose/Throat: Hearing grossly intact, nares w/o erythema or drainage, trachea midline Eyes: Conjunctiva clear. Sclera non-icteric Neck: Supple.  No JVD.  Pulmonary:  Good air movement, no use of accessory muscles.  Cardiac: RRR, normal S1, S2 Vascular:  Vessel Right Left  Radial Palpable Palpable                                    Musculoskeletal: M/S 5/5 throughout.  No deformity or atrophy.  1-2+ bilateral lower extremity edema a little worse on the right than the left Neurologic: Sensation grossly intact in extremities.  Symmetrical.  Speech is fluent.  Psychiatric: Judgment intact, Mood & affect appropriate for pt's clinical situation. Dermatologic: No rashes or ulcers noted.  No cellulitis or open wounds.       Labs Recent Results (from the past 2160 hour(s))  Basic metabolic panel     Status: Abnormal   Collection Time: 04/19/17  2:56 PM  Result Value Ref Range   Sodium 132 (L) 135 - 145 mmol/L   Potassium 4.3 3.5 - 5.1  mmol/L   Chloride 92 (L) 101 - 111 mmol/L   CO2 28 22 - 32 mmol/L   Glucose, Bld 479 (H) 65 - 99 mg/dL   BUN 25 (H) 6 - 20 mg/dL   Creatinine, Ser 0.93 0.44 - 1.00  mg/dL   Calcium 9.4 8.9 - 10.3 mg/dL   GFR calc non Af Amer >60 >60 mL/min   GFR calc Af Amer >60 >60 mL/min    Comment: (NOTE) The eGFR has been calculated using the CKD EPI equation. This calculation has not been validated in all clinical situations. eGFR's persistently <60 mL/min signify possible Chronic Kidney Disease.    Anion gap 12 5 - 15  CBC     Status: Abnormal   Collection Time: 04/19/17  2:56 PM  Result Value Ref Range   WBC 10.2 3.6 - 11.0 K/uL   RBC 4.30 3.80 - 5.20 MIL/uL   Hemoglobin 13.5 12.0 - 16.0 g/dL   HCT 40.9 35.0 - 47.0 %   MCV 94.9 80.0 - 100.0 fL   MCH 31.4 26.0 - 34.0 pg   MCHC 33.1 32.0 - 36.0 g/dL   RDW 15.7 (H) 11.5 - 14.5 %   Platelets 137 (L) 150 - 440 K/uL  Urinalysis, Complete w Microscopic     Status: Abnormal   Collection Time: 04/19/17  2:56 PM  Result Value Ref Range   Color, Urine STRAW (A) YELLOW   APPearance HAZY (A) CLEAR   Specific Gravity, Urine 1.006 1.005 - 1.030   pH 6.0 5.0 - 8.0   Glucose, UA >=500 (A) NEGATIVE mg/dL   Hgb urine dipstick NEGATIVE NEGATIVE   Bilirubin Urine NEGATIVE NEGATIVE   Ketones, ur NEGATIVE NEGATIVE mg/dL   Protein, ur NEGATIVE NEGATIVE mg/dL   Nitrite NEGATIVE NEGATIVE   Leukocytes, UA SMALL (A) NEGATIVE   RBC / HPF 0-5 0 - 5 RBC/hpf   WBC, UA 6-30 0 - 5 WBC/hpf   Bacteria, UA RARE (A) NONE SEEN   Squamous Epithelial / LPF 6-30 (A) NONE SEEN  Urine Culture     Status: Abnormal   Collection Time: 04/19/17  2:56 PM  Result Value Ref Range   Specimen Description URINE, RANDOM    Special Requests NONE    Culture (A)     >=100,000 COLONIES/mL GROUP B STREP(S.AGALACTIAE)ISOLATED TESTING AGAINST S. AGALACTIAE NOT ROUTINELY PERFORMED DUE TO PREDICTABILITY OF AMP/PEN/VAN SUSCEPTIBILITY. Performed at [facility], [place] [address], [place], [place] [address]    Report Status 04/21/2017 FINAL   Glucose, capillary     Status: Abnormal   Collection Time: 04/19/17  2:59 PM  Result Value Ref Range    Glucose-Capillary 466 (H) 65 - 99 mg/dL  Glucose, capillary     Status: Abnormal   Collection Time: 04/19/17  5:57 PM  Result Value Ref Range   Glucose-Capillary 320 (H) 65 - 99 mg/dL  CBC with Differential     Status: Abnormal   Collection Time: 04/23/17  6:37 AM  Result Value Ref Range   WBC 8.2 3.6 - 11.0 K/uL   RBC 4.40 3.80 - 5.20 MIL/uL   Hemoglobin 14.0 12.0 - 16.0 g/dL   HCT 41.7 35.0 - 47.0 %   MCV 94.7 80.0 - 100.0 fL   MCH 31.7 26.0 - 34.0 pg   MCHC 33.5 32.0 - 36.0 g/dL   RDW 15.6 (H) 11.5 - 14.5 %   Platelets 104 (L) 150 - 440 K/uL   Neutrophils Relative % 81 %  Neutro Abs 6.7 (H) 1.4 - 6.5 K/uL   Lymphocytes Relative 11 %   Lymphs Abs 0.9 (L) 1.0 - 3.6 K/uL   Monocytes Relative 7 %   Monocytes Absolute 0.5 0.2 - 0.9 K/uL   Eosinophils Relative 1 %   Eosinophils Absolute 0.1 0 - 0.7 K/uL   Basophils Relative 0 %   Basophils Absolute 0.0 0 - 0.1 K/uL  Comprehensive metabolic panel     Status: Abnormal   Collection Time: 04/23/17  6:37 AM  Result Value Ref Range   Sodium 136 135 - 145 mmol/L   Potassium 3.6 3.5 - 5.1 mmol/L   Chloride 100 (L) 101 - 111 mmol/L   CO2 28 22 - 32 mmol/L   Glucose, Bld 222 (H) 65 - 99 mg/dL   BUN 20 6 - 20 mg/dL   Creatinine, Ser 0.76 0.44 - 1.00 mg/dL   Calcium 8.5 (L) 8.9 - 10.3 mg/dL   Total Protein 6.6 6.5 - 8.1 g/dL   Albumin 3.2 (L) 3.5 - 5.0 g/dL   AST 26 15 - 41 U/L   ALT 27 14 - 54 U/L   Alkaline Phosphatase 180 (H) 38 - 126 U/L   Total Bilirubin 0.7 0.3 - 1.2 mg/dL   GFR calc non Af Amer >60 >60 mL/min   GFR calc Af Amer >60 >60 mL/min    Comment: (NOTE) The eGFR has been calculated using the CKD EPI equation. This calculation has not been validated in all clinical situations. eGFR's persistently <60 mL/min signify possible Chronic Kidney Disease.    Anion gap 8 5 - 15  Basic metabolic panel     Status: Abnormal   Collection Time: 06/16/17  4:59 PM  Result Value Ref Range   Sodium 139 135 - 145 mmol/L    Potassium 3.8 3.5 - 5.1 mmol/L   Chloride 99 (L) 101 - 111 mmol/L   CO2 30 22 - 32 mmol/L   Glucose, Bld 166 (H) 65 - 99 mg/dL   BUN 21 (H) 6 - 20 mg/dL   Creatinine, Ser 0.83 0.44 - 1.00 mg/dL   Calcium 8.9 8.9 - 10.3 mg/dL   GFR calc non Af Amer >60 >60 mL/min   GFR calc Af Amer >60 >60 mL/min    Comment: (NOTE) The eGFR has been calculated using the CKD EPI equation. This calculation has not been validated in all clinical situations. eGFR's persistently <60 mL/min signify possible Chronic Kidney Disease.    Anion gap 10 5 - 15    Comment: Performed at [facility], [address] [place], [place] [address]  CBC     Status: Abnormal   Collection Time: 06/16/17  4:59 PM  Result Value Ref Range   WBC 10.2 3.6 - 11.0 K/uL   RBC 4.25 3.80 - 5.20 MIL/uL   Hemoglobin 13.6 12.0 - 16.0 g/dL   HCT 40.1 35.0 - 47.0 %   MCV 94.4 80.0 - 100.0 fL   MCH 31.9 26.0 - 34.0 pg   MCHC 33.8 32.0 - 36.0 g/dL   RDW 16.3 (H) 11.5 - 14.5 %   Platelets 110 (L) 150 - 440 K/uL    Comment: Performed at [facility], [address] [place], [place] [address]  Troponin I     Status: None   Collection Time: 06/16/17  4:59 PM  Result Value Ref Range   Troponin I <0.03 <0.03 ng/mL    Comment: Performed at [facility], [place],  [place], [place] [address]  Fibrin derivatives D-Dimer ([place] only)     Status: None   Collection Time: 06/16/17  8:41 PM  Result Value Ref Range   Fibrin derivatives D-dimer ([facility]) 320.57 0.00 - 499.00 ng/mL (FEU)    Comment: (NOTE) <> Exclusion of Venous Thromboembolism (VTE) - OUTPATIENT ONLY   (Emergency Department or [place])   0-499 ng/ml (FEU): With a low to intermediate pretest probability                      for VTE this test result excludes the diagnosis                      of VTE.   >499 ng/ml (FEU) : VTE not excluded; additional work up for VTE is                      required. <> Testing on Inpatients and Evaluation of  Disseminated Intravascular   Coagulation (DIC) Reference Range:   0-499 ng/ml (FEU) Performed at [facility], [address] [place], [place] [address]   Troponin I     Status: None   Collection Time: 06/16/17  8:41 PM  Result Value Ref Range   Troponin I <0.03 <0.03 ng/mL    Comment: Performed at [facility], [address] [place], [place] [address]    Radiology Dg Chest 2 View  Result Date: 06/16/2017 CLINICAL DATA:  Chest pain off and on all day, shortness of breath, neck stiffness and question swelling EXAM: CHEST  2 VIEW COMPARISON:  03/07/2017 FINDINGS: Enlargement of cardiac silhouette. Rotated to the RIGHT. Mediastinal contours and pulmonary vascularity normal. Lungs clear. No pleural effusion or pneumothorax. Prior cervical spine fusion and intraspinal stimulator again noted. IMPRESSION: Enlargement of cardiac silhouette without acute infiltrate. Electronically Signed   By: Lavonia Dana M.D.   On: 06/16/2017 17:37   Mm Screening Breast Tomo Bilateral  Result Date: 06/15/2017 CLINICAL DATA:  Screening. EXAM: 2D DIGITAL SCREENING BILATERAL MAMMOGRAM WITH 3D TOMO WITH CAD COMPARISON:  Previous exam(s). ACR Breast Density Category a: The breast tissue is almost entirely fatty. FINDINGS: There are no findings suspicious for malignancy. Images were processed with CAD. IMPRESSION: No mammographic evidence of malignancy. A result letter of this screening mammogram will be mailed directly to the patient. RECOMMENDATION: Screening mammogram in one year. (Code:SM-B-01Y) BI-RADS CATEGORY  1: Negative. Electronically Signed   By: Curlene Dolphin M.D.   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 Also, involved in wound healing. On appropriate medications. May have caused some degree of neuropathy as well that could explain her  lower extremity symptoms.   Essential hypertension blood pressure control important in reducing the progression of atherosclerotic disease. On appropriate oral medications.  Swelling of limb Stable to slightly improved. Continue daily use of compression stockings, leg elevation, and exercise. Weight loss would also be of benefit.  Pain in limb I suspect this is a mixed source of neuropathy, musculoskeletal issues, and some of her swelling with lymphedema. Venous study was negative.  Lymphedema Continue compression stockings, elevation, and exercise. Lymphedema pump would be a helpful adjuvant therapy, but she is really not using it. Recheck in 1 year    No problem-specific Assessment & Plan notes found for this encounter.    Leotis Pain, MD  07/04/2017 2:11  PM    This note was created with Dragon medical transcription system.  Any errors from dictation are purely unintentional

## 2017-07-07 ENCOUNTER — Ambulatory Visit
Admission: RE | Admit: 2017-07-07 | Discharge: 2017-07-07 | Disposition: A | Discharge status: Home / Self Care | Payer: Medicare Other | Source: Ambulatory Visit | Attending: Thoracic Surgery (Cardiothoracic Vascular Surgery) | Admitting: Thoracic Surgery (Cardiothoracic Vascular Surgery)

## 2017-07-07 DIAGNOSIS — R918 Other nonspecific abnormal finding of lung field: Secondary | ICD-10-CM

## 2017-07-11 ENCOUNTER — Other Ambulatory Visit: Payer: Medicare Other

## 2017-07-11 ENCOUNTER — Ambulatory Visit: Payer: Medicare Other | Admitting: Thoracic Surgery (Cardiothoracic Vascular Surgery)

## 2017-07-14 IMAGING — DX DG KNEE COMPLETE 4+V*R*
4 series · 4 of 4 positions shown · non-contrast
Comparison: Right knee radiograph dated 07/14/2016

CLINICAL DATA: 66-year-old female with recent surgery presenting
with right knee pain and swelling.

EXAM:
RIGHT KNEE - COMPLETE 4+ VIEW

[knee lat]
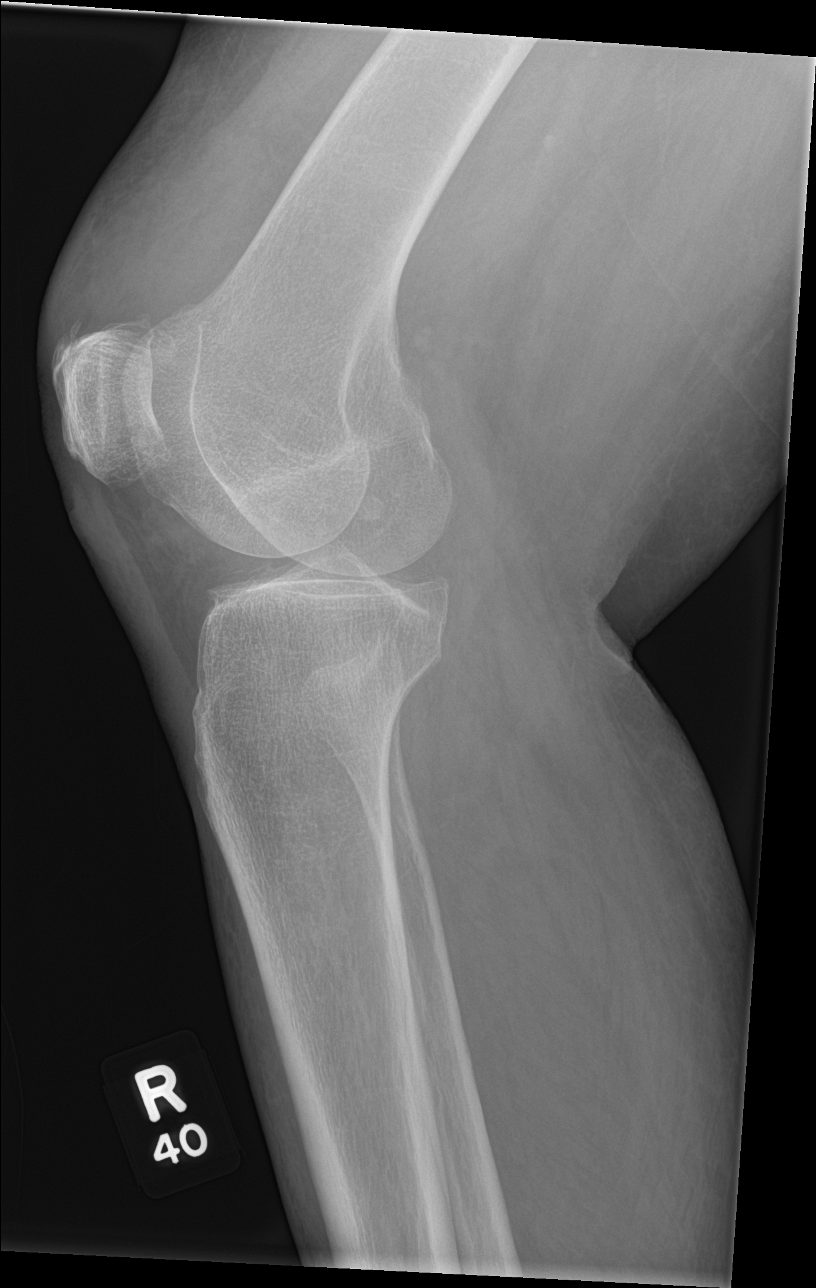

[knee obl (1 of 3)]
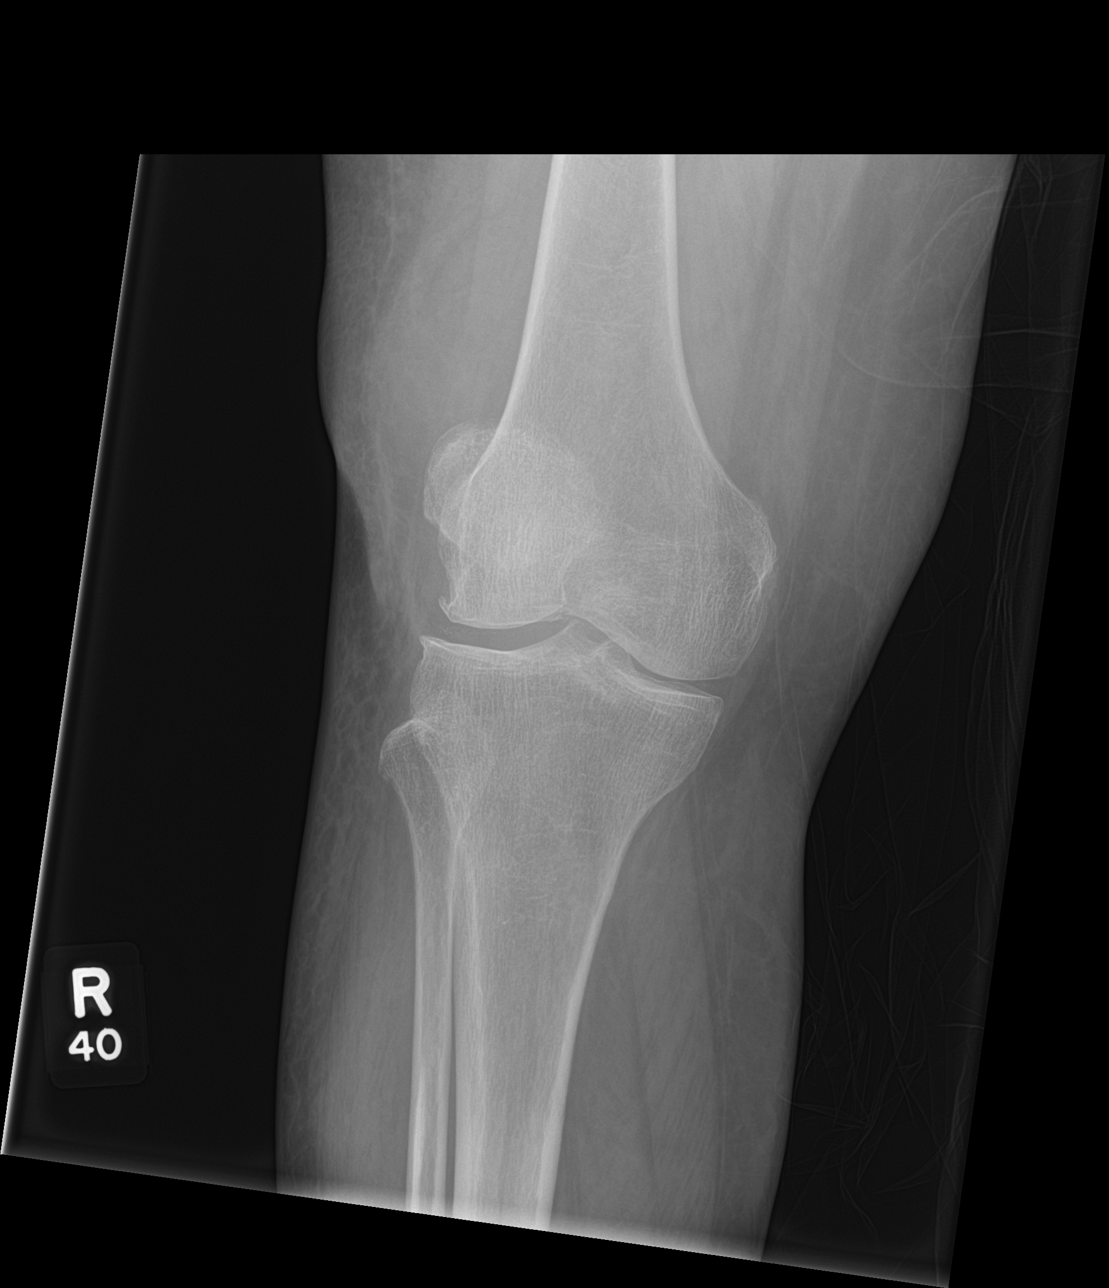

[knee obl (2 of 3)]
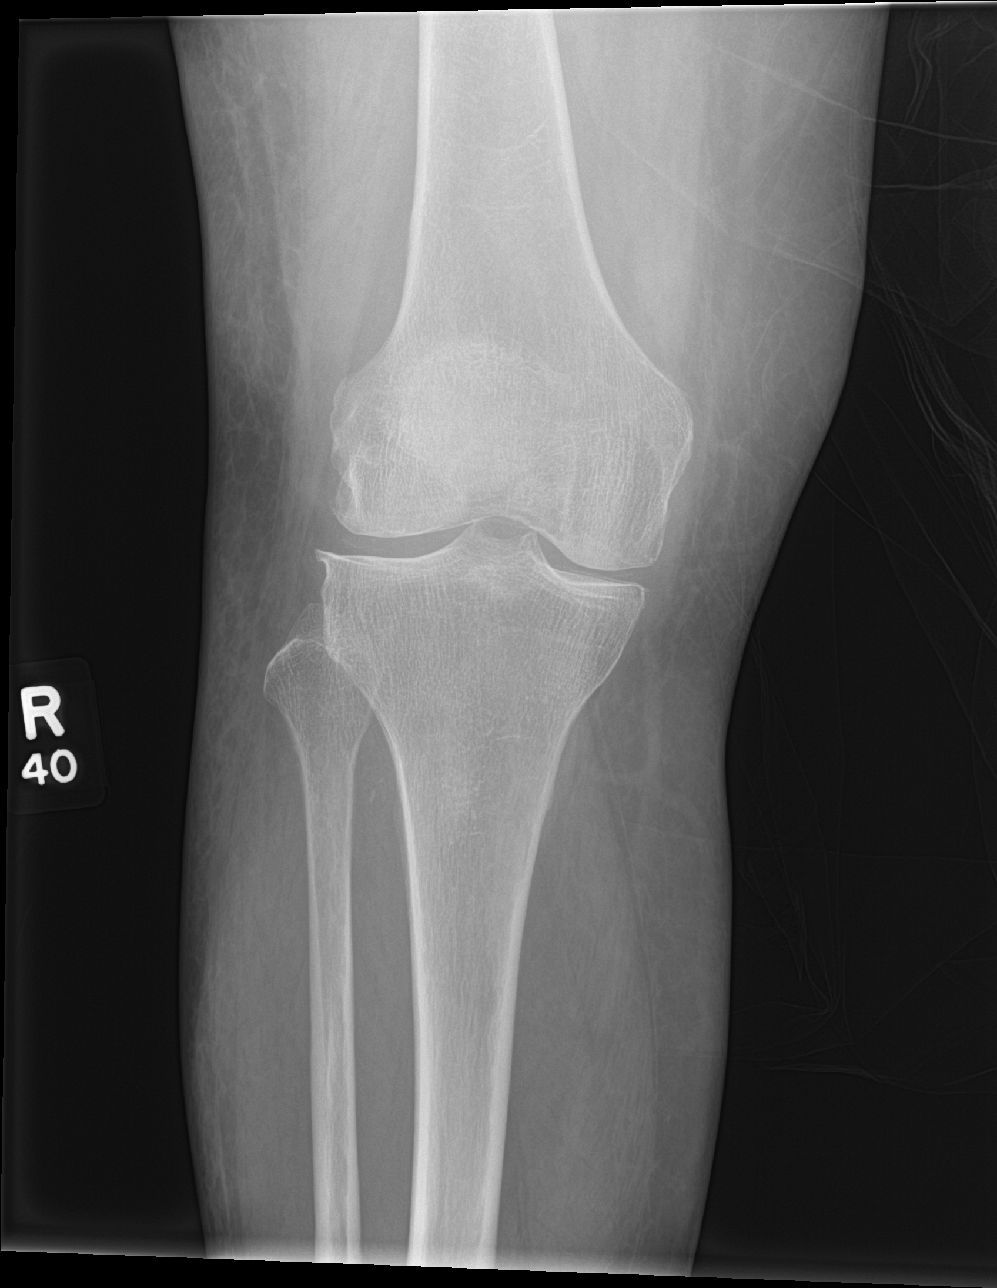

[knee obl (3 of 3)]
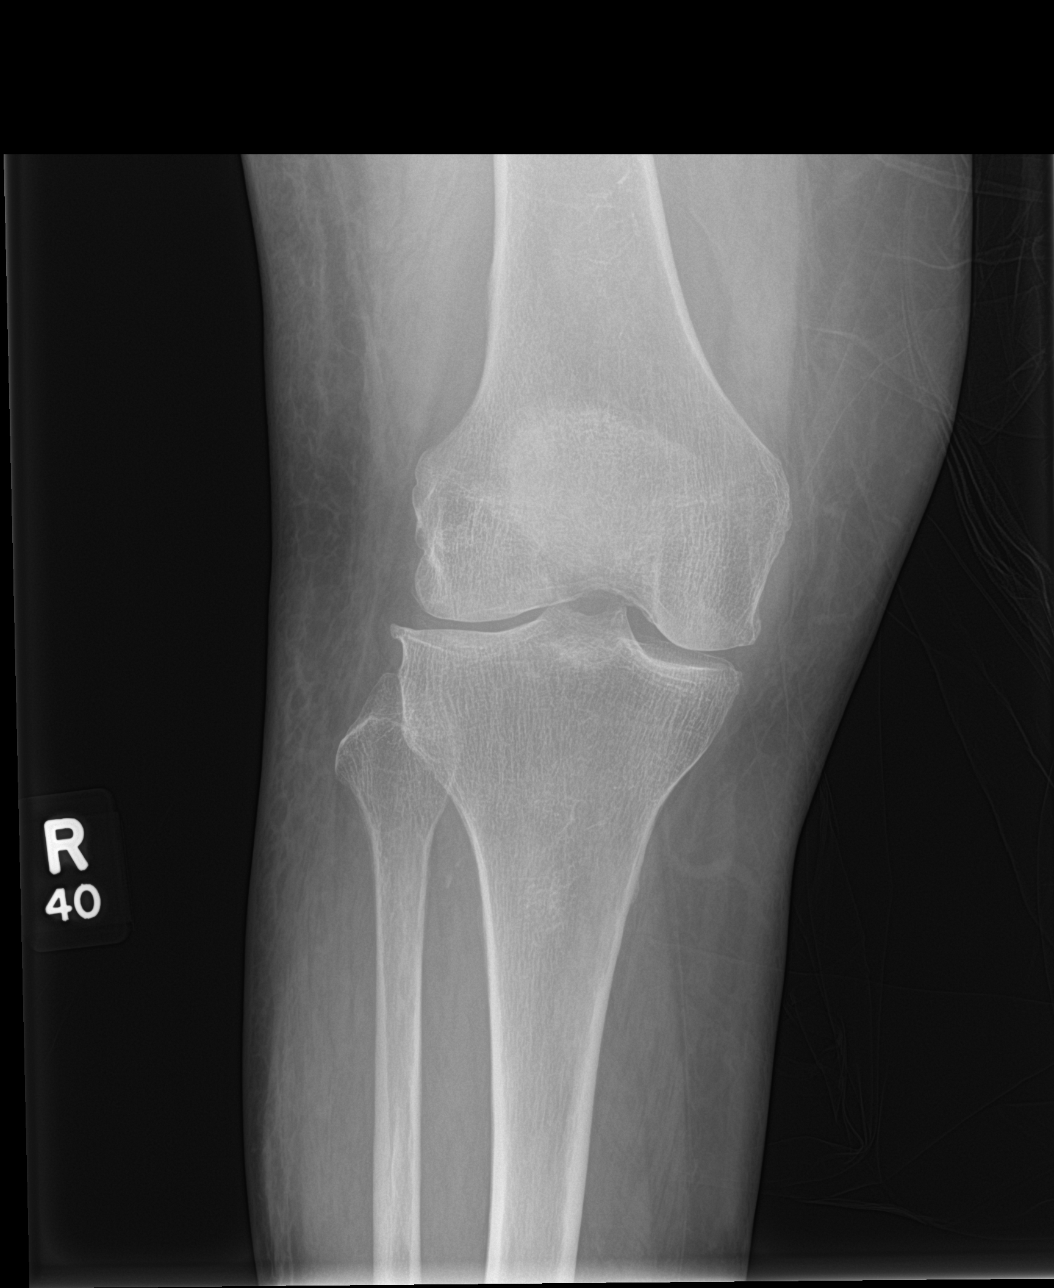

[4 of 4 positions shown; findings below may reference images not displayed]

FINDINGS: There is no acute fracture or dislocation. The bones are osteopenic.
There is mild osteoarthritic changes of the knee with bone spurring.
Meniscal chondrocalcinosis noted. There is a moderate size
suprapatellar effusion which is increased in size compared to the
prior radiograph. There is diffuse subcutaneous edema with
thickening of the skin over the knee. There is overall increased
swelling of the knee compared to the prior radiograph. Correlation
with clinical exam is recommended to exclude an infectious process.
IMPRESSION: 1. Moderate suprapatellar effusion increased compared to the prior
radiograph. Clinical correlation is recommended to exclude an
infectious process.
2. No acute fracture or dislocation.
3. Mild osteoarthritic changes.

## 2017-07-17 ENCOUNTER — Encounter: Payer: Self-pay | Admitting: Student in an Organized Health Care Education/Training Program

## 2017-07-17 ENCOUNTER — Other Ambulatory Visit: Payer: Self-pay

## 2017-07-17 ENCOUNTER — Ambulatory Visit (HOSPITAL_BASED_OUTPATIENT_CLINIC_OR_DEPARTMENT_OTHER): Payer: Medicare Other | Admitting: Student in an Organized Health Care Education/Training Program

## 2017-07-17 ENCOUNTER — Ambulatory Visit
Admission: RE | Admit: 2017-07-17 | Discharge: 2017-07-17 | Disposition: A | Discharge status: Home / Self Care | Payer: Medicare Other | Source: Ambulatory Visit | Attending: Student in an Organized Health Care Education/Training Program | Admitting: Student in an Organized Health Care Education/Training Program

## 2017-07-17 VITALS — BP 119/64 | HR 88 | Temp 98.0°F | Resp 15 | Ht 63.0 in | Wt 225.0 lb

## 2017-07-17 DIAGNOSIS — M4326 Fusion of spine, lumbar region: Secondary | ICD-10-CM | POA: Insufficient documentation

## 2017-07-17 DIAGNOSIS — M47818 Spondylosis without myelopathy or radiculopathy, sacral and sacrococcygeal region: Secondary | ICD-10-CM

## 2017-07-17 DIAGNOSIS — Z794 Long term (current) use of insulin: Secondary | ICD-10-CM | POA: Insufficient documentation

## 2017-07-17 DIAGNOSIS — Z90710 Acquired absence of both cervix and uterus: Secondary | ICD-10-CM

## 2017-07-17 DIAGNOSIS — R4182 Altered mental status, unspecified: Secondary | ICD-10-CM | POA: Diagnosis not present

## 2017-07-17 DIAGNOSIS — Z9889 Other specified postprocedural states: Secondary | ICD-10-CM | POA: Insufficient documentation

## 2017-07-17 DIAGNOSIS — Z881 Allergy status to other antibiotic agents status: Secondary | ICD-10-CM

## 2017-07-17 DIAGNOSIS — M4322 Fusion of spine, cervical region: Secondary | ICD-10-CM | POA: Insufficient documentation

## 2017-07-17 DIAGNOSIS — Z9049 Acquired absence of other specified parts of digestive tract: Secondary | ICD-10-CM

## 2017-07-17 DIAGNOSIS — M4698 Unspecified inflammatory spondylopathy, sacral and sacrococcygeal region: Secondary | ICD-10-CM | POA: Diagnosis not present

## 2017-07-17 DIAGNOSIS — Z79899 Other long term (current) drug therapy: Secondary | ICD-10-CM

## 2017-07-17 DIAGNOSIS — A419 Sepsis, unspecified organism: Secondary | ICD-10-CM | POA: Diagnosis not present

## 2017-07-17 DIAGNOSIS — M47898 Other spondylosis, sacral and sacrococcygeal region: Secondary | ICD-10-CM | POA: Insufficient documentation

## 2017-07-17 MED ORDER — ROPIVACAINE HCL 2 MG/ML IJ SOLN
10.0000 mL | Freq: Once | INTRAMUSCULAR | Status: AC
  Administered 2017-07-17: 10 mL

## 2017-07-17 MED ORDER — ROPIVACAINE HCL 2 MG/ML IJ SOLN
INTRAMUSCULAR | Status: AC
Start: 2017-07-17 — End: ?
  Filled 2017-07-17: qty 10

## 2017-07-17 MED ORDER — DEXAMETHASONE SODIUM PHOSPHATE 10 MG/ML IJ SOLN
INTRAMUSCULAR | Status: AC
  Filled 2017-07-17: qty 1

## 2017-07-17 MED ORDER — FENTANYL CITRATE (PF) 100 MCG/2ML IJ SOLN
25.0000 ug | INTRAMUSCULAR | Status: DC | PRN
  Administered 2017-07-17: 25 ug via INTRAVENOUS

## 2017-07-17 MED ORDER — LIDOCAINE HCL (PF) 1 % IJ SOLN
10.0000 mL | Freq: Once | INTRAMUSCULAR | Status: AC
Start: 2017-07-17 — End: 2017-07-17
  Administered 2017-07-17: 5 mL

## 2017-07-17 MED ORDER — FENTANYL CITRATE (PF) 100 MCG/2ML IJ SOLN
INTRAMUSCULAR | Status: AC
  Filled 2017-07-17: qty 2

## 2017-07-17 MED ORDER — DEXAMETHASONE SODIUM PHOSPHATE 10 MG/ML IJ SOLN
10.0000 mg | Freq: Once | INTRAMUSCULAR | Status: AC
  Administered 2017-07-17: 10 mg

## 2017-07-17 MED ORDER — LIDOCAINE HCL (PF) 1 % IJ SOLN
INTRAMUSCULAR | Status: AC
  Filled 2017-07-17: qty 5

## 2017-07-17 NOTE — Progress Notes (Signed)
Safety precautions to be maintained throughout the outpatient stay will include: orient to surroundings, keep bed in low position, maintain call bell within reach at all times, provide assistance with transfer out of bed and ambulation.  

## 2017-07-17 NOTE — Progress Notes (Signed)
Patient's Name: Stephanie [name]  MRN: [id number]  Referring Provider: Sarajane Jews, MD  DOB: [birth year]  PCP: Sarajane Jews, MD  DOS: 07/17/2017  Note by: Gillis Santa, MD  Service setting: Ambulatory outpatient  Specialty: Interventional Pain Management  Patient type: Established  Location: [facility] (AMB) Pain Management Facility  Visit type: Interventional Procedure   Primary Reason for Visit: Interventional Pain Management Treatment. CC: Leg Pain (bilaterally)  Procedure:  Anesthesia, Analgesia, Anxiolysis:  Type: Diagnostic Sacroiliac Joint Steroid Injection          Region: Inferior Lumbosacral Region Level: PIIS (Posterior Inferior Iliac Spine) Laterality: Bilateral  Type: Local Anesthesia with Moderate (Conscious) Sedation Local Anesthetic: Lidocaine 1% Route: Intravenous (IV) IV Access: Secured Sedation: Meaningful verbal contact was maintained at all times during the procedure  Indication(s): Analgesia and Anxiety   Indications: 1. SI joint arthritis (HCC)    Pain Score: Pre-procedure: 5 /10 Post-procedure: 0-No pain/10  Pre-op Assessment:  Stephanie [name] is a 67 y.o. (year old), female patient, seen today for interventional treatment. 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 [name] has a current medication list which includes the following prescription(s): albuterol, albuterol, alum & mag hydroxide-simeth, aspirin, atorvastatin, barrier  cream, budesonide-formoterol, bupropion, clonazepam, cyclobenzaprine, diclofenac sodium, dicyclomine, diphenhydramine, ferrous sulfate, furosemide, gabapentin, gabapentin, insulin glargine, insulin lispro, lactulose, lamotrigine, lidocaine, lidocaine, lyrica, menthol-zinc oxide, metoclopramide, mirtazapine, omeprazole, ondansetron, oxybutynin, potassium chloride sa, ropinirole, simvastatin, eucerin, spironolactone, tramadol, vitamin c, zolpidem, bacitracin-polymyxin b, eucerin intensive repair, guaifenesin, loperamide, magnesium hydroxide, oxycodone, pyrithione zinc, and xylitol, and the following Facility-Administered Medications: fentanyl. Her primarily concern today is the Leg Pain (bilaterally)  Initial Vital Signs:  Pulse Rate: 88 Temp: 97.7 F (36.5 C) Resp: 16 BP: (!) 111/43 SpO2: 99 %  BMI: Estimated body mass index is 39.86 kg/m as calculated from the following:   Height as of this encounter: 5' 3"  (1.6 m).   Weight as of this encounter: 225 lb (102.1 kg).  Risk Assessment: Allergies: Reviewed. She is allergic to doxycycline.  Allergy Precautions: None required Coagulopathies: Reviewed. None identified.  Blood-thinner therapy: None at this time Active Infection(s): Reviewed. None identified. Ms. Bouse is afebrile  Site Confirmation: [name] was asked to confirm the procedure and laterality before marking the site Procedure checklist: Completed Consent: Before the procedure and under the influence of no sedative(s), amnesic(s), or anxiolytics, the patient was informed of the treatment options, risks and possible complications. To fulfill our ethical and legal obligations, as recommended by the American Medical Association's Code of Ethics, I have informed the patient of my clinical impression; the nature and purpose of the treatment or procedure; the risks, benefits, and possible complications of the intervention; the alternatives, including doing nothing; the risk(s) and  benefit(s) of the alternative treatment(s) or procedure(s); and the risk(s) and benefit(s) of doing nothing. The patient was provided information about the general risks and possible complications associated with the procedure. These may include, but are not limited to: failure to achieve desired goals, infection, bleeding, organ or nerve damage, allergic reactions, paralysis, and death. In addition, the patient was informed of those risks and complications associated to the procedure,  such as failure to decrease pain; infection; bleeding; organ or nerve damage with subsequent damage to sensory, motor, and/or autonomic systems, resulting in permanent pain, numbness, and/or weakness of one or several areas of the body; allergic reactions; (i.e.: anaphylactic reaction); and/or death. Furthermore, the patient was informed of those risks and complications associated with the medications. These include, but are not limited to: allergic reactions (i.e.: anaphylactic or anaphylactoid reaction(s)); adrenal axis suppression; blood sugar elevation that in diabetics may result in ketoacidosis or comma; water retention that in patients with history of congestive heart failure may result in shortness of breath, pulmonary edema, and decompensation with resultant heart failure; weight gain; swelling or edema; medication-induced neural toxicity; particulate matter embolism and blood vessel occlusion with resultant organ, and/or nervous system infarction; and/or aseptic necrosis of one or more joints. Finally, the patient was informed that Medicine is not an exact science; therefore, there is also the possibility of unforeseen or unpredictable risks and/or possible complications that may result in a catastrophic outcome. The patient indicated having understood very clearly. We have given the patient no guarantees and we have made no promises. Enough time was given to the patient to ask questions, all of which were answered to  the patient's satisfaction. [name] has indicated that she wanted to continue with the procedure. Attestation: I, the ordering provider, attest that I have discussed with the patient the benefits, risks, side-effects, alternatives, likelihood of achieving goals, and potential problems during recovery for the procedure that I have provided informed consent. Date: 07/17/2017  Time: N/A  Pre-Procedure Preparation:  Monitoring: As per clinic protocol. Respiration, ETCO2, SpO2, BP, heart rate and rhythm monitor placed and checked for adequate function Safety Precautions: Patient was assessed for positional comfort and pressure points before starting the procedure. Time-out: I initiated and conducted the "Time-out" before starting the procedure, as per protocol. The patient was asked to participate by confirming the accuracy of the "Time Out" information. Verification of the correct person, site, and procedure were performed and confirmed by me, the nursing staff, and the patient. "Time-out" conducted as per Joint Commission's Universal Protocol (UP.01.01.01). "Time-out" Date & Time: 07/17/2017; 0937 hrs.  Description of Procedure:       Position: Prone Target Area: Inferior, posterior, aspect of the sacroiliac fissure Approach: Posterior, paraspinal, ipsilateral approach. Area Prepped: Entire Lower Lumbosacral Region Prepping solution: ChloraPrep (2% chlorhexidine gluconate and 70% isopropyl alcohol) Safety Precautions: Aspiration looking for blood return was conducted prior to all injections. At no point did we inject any substances, as a needle was being advanced. No attempts were made at seeking any paresthesias. Safe injection practices and needle disposal techniques used. Medications properly checked for expiration dates. SDV (single dose vial) medications used. Description of the Procedure: Protocol guidelines were followed. The patient was placed in position over the procedure table. The target  area was identified and the area prepped in the usual manner. Skin & deeper tissues infiltrated with local anesthetic. Appropriate amount of time allowed to pass for local anesthetics to take effect. The procedure needle was advanced under fluoroscopic guidance into the sacroiliac joint until a firm endpoint was obtained. Proper needle placement secured. Negative aspiration confirmed. Solution injected in intermittent fashion, asking for systemic symptoms every 0.5cc of injectate. The needles were then removed and the area cleansed, making sure to leave some of the prepping solution back to take advantage of its long term bactericidal properties. Vitals:   07/17/17 0947 07/17/17 0956 07/17/17 1008 07/17/17 1017  BP:  113/78 130/66 (!) 111/54 119/64  Pulse:      Resp: 10 13 13 15   Temp:   98 F (36.7 C)   TempSrc:      SpO2: 95% 91% 92% 94%  Weight:      Height:        Start Time: 0937 hrs. End Time: 0945 hrs. Materials:  Needle(s) Type: Regular needle Gauge: 22G Length: 3.5-in Medication(s): Please see orders for medications and dosing details. 10 cc solution made of 9 cc of 0.2% ropivacaine, 1 cc of Decadron 10 mg/cc.  2.5 cc injected periarticular, 2.5 cc injected intra-articular on each side. Imaging Guidance (Non-Spinal):  Type of Imaging Technique: Fluoroscopy Guidance (Non-Spinal) Indication(s): Assistance in needle guidance and placement for procedures requiring needle placement in or near specific anatomical locations not easily accessible without such assistance. Exposure Time: Please see nurses notes. Contrast: Before injecting any contrast, we confirmed that the patient did not have an allergy to iodine, shellfish, or radiological contrast. Once satisfactory needle placement was completed at the desired level, radiological contrast was injected. Contrast injected under live fluoroscopy. No contrast complications. See chart for type and volume of contrast used. Fluoroscopic  Guidance: I was personally present during the use of fluoroscopy. "Tunnel Vision Technique" used to obtain the best possible view of the target area. Parallax error corrected before commencing the procedure. "Direction-depth-direction" technique used to introduce the needle under continuous pulsed fluoroscopy. Once target was reached, antero-posterior, oblique, and lateral fluoroscopic projection used confirm needle placement in all planes. Images permanently stored in EMR. Interpretation: I personally interpreted the imaging intraoperatively. Adequate needle placement confirmed in multiple planes. Appropriate spread of contrast into desired area was observed. No evidence of afferent or efferent intravascular uptake. Permanent images saved into the patient's record.  Antibiotic Prophylaxis:   Anti-infectives (From admission, onward)   None     Indication(s): None identified  Post-operative Assessment:  Post-procedure Vital Signs:  Pulse Rate: 88 Temp: 98 F (36.7 C) Resp: 15 BP: 119/64 SpO2: 94 %  EBL: None  Complications: No immediate post-treatment complications observed by team, or reported by patient.  Note: The patient tolerated the entire procedure well. A repeat set of vitals were taken after the procedure and the patient was kept under observation following institutional policy, for this type of procedure. Post-procedural neurological assessment was performed, showing return to baseline, prior to discharge. The patient was provided with post-procedure discharge instructions, including a section on how to identify potential problems. Should any problems arise concerning this procedure, the patient was given instructions to immediately contact us, at any time, without hesitation. In any case, we plan to contact the patient by telephone for a follow-up status report regarding this interventional procedure.  Comments:  No additional relevant information. 5 out of 5 strength bilateral  lower extremity: Plantar flexion, dorsiflexion, knee flexion, knee extension.  Plan of Care    Imaging Orders     DG C-Arm 1-60 Min-No Report Procedure Orders    No procedure(s) ordered today    Medications ordered for procedure: Meds ordered this encounter  Medications  . lidocaine (PF) (XYLOCAINE) 1 % injection 10 mL  . ropivacaine (PF) 2 mg/mL (0.2%) (NAROPIN) injection 10 mL  . dexamethasone (DECADRON) injection 10 mg  . fentaNYL (SUBLIMAZE) injection 25-100 mcg    Make sure Narcan is available in the pyxis when using this medication. In the event of respiratory depression (RR< 8/min): Titrate NARCAN (naloxone) in increments of 0.1 to 0.2 mg IV at  2-3 minute intervals, until desired degree of reversal.   Medications administered: We administered lidocaine (PF), ropivacaine (PF) 2 mg/mL (0.2%), dexamethasone, and fentaNYL.  See the medical record for exact dosing, route, and time of administration.  New Prescriptions   No medications on file   Disposition: Discharge home  Discharge Date & Time: 07/17/2017; 1021 hrs.   Physician-requested Follow-up: Return in about 4 weeks (around 08/14/2017) for Post Procedure Evaluation.  Future Appointments  Date Time Provider [place]  07/25/2017 12:30 PM Melrose Nakayama, MD [facility]  08/07/2017  1:45 PM Lequita Asal, MD [facility] None  08/14/2017  9:00 AM Gillis Santa, MD [facility] None  07/03/2018 10:15 AM Dew, Erskine Squibb, MD [facility] None   Primary Care Physician: Sarajane Jews, MD Location: [facility] [facility] Note by: Gillis Santa, MD Date: 07/17/2017; Time: 10:22 AM  Disclaimer:  Medicine is not an exact science. The only guarantee in medicine is that nothing is guaranteed. It is important to note that the decision to proceed with this intervention was based on the information collected from the patient. The Data and conclusions were drawn from the patient's questionnaire, the  interview, and the physical examination. Because the information was provided in large part by the patient, it cannot be guaranteed that it has not been purposely or unconsciously manipulated. Every effort has been made to obtain as much relevant data as possible for this evaluation. It is important to note that the conclusions that lead to this procedure are derived in large part from the available data. Always take into account that the treatment will also be dependent on availability of resources and existing treatment guidelines, considered by other Pain Management Practitioners as being common knowledge and practice, at the time of the intervention. For Medico-Legal purposes, it is also important to point out that variation in procedural techniques and pharmacological choices are the acceptable norm. The indications, contraindications, technique, and results of the above procedure should only be interpreted and judged by a Board-Certified Interventional Pain Specialist with extensive familiarity and expertise in the same exact procedure and technique.

## 2017-07-18 ENCOUNTER — Telehealth: Payer: Self-pay | Admitting: *Deleted

## 2017-07-18 NOTE — Telephone Encounter (Signed)
LVM for patient to call for any post procedure instructions.

## 2017-07-19 ENCOUNTER — Emergency Department: Payer: Medicare Other

## 2017-07-19 ENCOUNTER — Inpatient Hospital Stay
Admission: EM | Admit: 2017-07-19 | Discharge: 2017-07-22 | DRG: 871 | Disposition: A | Discharge status: Nursing Home (Includes ICF) | Payer: Medicare Other | Attending: Internal Medicine | Admitting: Internal Medicine

## 2017-07-19 DIAGNOSIS — Z87891 Personal history of nicotine dependence: Secondary | ICD-10-CM

## 2017-07-19 DIAGNOSIS — J45909 Unspecified asthma, uncomplicated: Secondary | ICD-10-CM | POA: Diagnosis present

## 2017-07-19 DIAGNOSIS — Z981 Arthrodesis status: Secondary | ICD-10-CM

## 2017-07-19 DIAGNOSIS — K7460 Unspecified cirrhosis of liver: Secondary | ICD-10-CM | POA: Diagnosis present

## 2017-07-19 DIAGNOSIS — R4182 Altered mental status, unspecified: Secondary | ICD-10-CM

## 2017-07-19 DIAGNOSIS — K219 Gastro-esophageal reflux disease without esophagitis: Secondary | ICD-10-CM | POA: Diagnosis present

## 2017-07-19 DIAGNOSIS — Z66 Do not resuscitate: Secondary | ICD-10-CM | POA: Diagnosis present

## 2017-07-19 DIAGNOSIS — Z7951 Long term (current) use of inhaled steroids: Secondary | ICD-10-CM

## 2017-07-19 DIAGNOSIS — Z7982 Long term (current) use of aspirin: Secondary | ICD-10-CM

## 2017-07-19 DIAGNOSIS — Z90710 Acquired absence of both cervix and uterus: Secondary | ICD-10-CM

## 2017-07-19 DIAGNOSIS — J4520 Mild intermittent asthma, uncomplicated: Secondary | ICD-10-CM | POA: Diagnosis present

## 2017-07-19 DIAGNOSIS — Z9842 Cataract extraction status, left eye: Secondary | ICD-10-CM

## 2017-07-19 DIAGNOSIS — N390 Urinary tract infection, site not specified: Secondary | ICD-10-CM | POA: Diagnosis present

## 2017-07-19 DIAGNOSIS — E11649 Type 2 diabetes mellitus with hypoglycemia without coma: Secondary | ICD-10-CM | POA: Diagnosis present

## 2017-07-19 DIAGNOSIS — Z8673 Personal history of transient ischemic attack (TIA), and cerebral infarction without residual deficits: Secondary | ICD-10-CM

## 2017-07-19 DIAGNOSIS — G4733 Obstructive sleep apnea (adult) (pediatric): Secondary | ICD-10-CM | POA: Diagnosis present

## 2017-07-19 DIAGNOSIS — A419 Sepsis, unspecified organism: Secondary | ICD-10-CM | POA: Diagnosis present

## 2017-07-19 DIAGNOSIS — B951 Streptococcus, group B, as the cause of diseases classified elsewhere: Secondary | ICD-10-CM | POA: Diagnosis present

## 2017-07-19 DIAGNOSIS — F419 Anxiety disorder, unspecified: Secondary | ICD-10-CM | POA: Diagnosis present

## 2017-07-19 DIAGNOSIS — G9341 Metabolic encephalopathy: Secondary | ICD-10-CM | POA: Diagnosis present

## 2017-07-19 DIAGNOSIS — Z881 Allergy status to other antibiotic agents status: Secondary | ICD-10-CM

## 2017-07-19 DIAGNOSIS — T68XXXA Hypothermia, initial encounter: Secondary | ICD-10-CM | POA: Diagnosis present

## 2017-07-19 DIAGNOSIS — Z833 Family history of diabetes mellitus: Secondary | ICD-10-CM

## 2017-07-19 DIAGNOSIS — E2740 Unspecified adrenocortical insufficiency: Secondary | ICD-10-CM | POA: Diagnosis present

## 2017-07-19 DIAGNOSIS — G2581 Restless legs syndrome: Secondary | ICD-10-CM | POA: Diagnosis present

## 2017-07-19 DIAGNOSIS — B1910 Unspecified viral hepatitis B without hepatic coma: Secondary | ICD-10-CM | POA: Diagnosis present

## 2017-07-19 DIAGNOSIS — Z794 Long term (current) use of insulin: Secondary | ICD-10-CM

## 2017-07-19 DIAGNOSIS — K7290 Hepatic failure, unspecified without coma: Secondary | ICD-10-CM | POA: Diagnosis present

## 2017-07-19 DIAGNOSIS — E785 Hyperlipidemia, unspecified: Secondary | ICD-10-CM | POA: Diagnosis present

## 2017-07-19 DIAGNOSIS — Z9841 Cataract extraction status, right eye: Secondary | ICD-10-CM

## 2017-07-19 DIAGNOSIS — F319 Bipolar disorder, unspecified: Secondary | ICD-10-CM | POA: Diagnosis present

## 2017-07-19 DIAGNOSIS — F411 Generalized anxiety disorder: Secondary | ICD-10-CM | POA: Diagnosis present

## 2017-07-19 DIAGNOSIS — Z79899 Other long term (current) drug therapy: Secondary | ICD-10-CM

## 2017-07-19 LAB — LACTIC ACID, PLASMA: Lactic Acid, Venous: 1.2 mmol/L (ref 0.5–1.9)

## 2017-07-19 LAB — URINALYSIS, ROUTINE W REFLEX MICROSCOPIC
Bilirubin Urine: NEGATIVE
Glucose, UA: NEGATIVE mg/dL
Hgb urine dipstick: NEGATIVE
Ketones, ur: NEGATIVE mg/dL
NITRITE: NEGATIVE
PH: 6 (ref 5.0–8.0)
PROTEIN: NEGATIVE mg/dL
SQUAMOUS EPITHELIAL / LPF: NONE SEEN
Specific Gravity, Urine: 1.015 (ref 1.005–1.030)

## 2017-07-19 LAB — CBC WITH DIFFERENTIAL/PLATELET
BASOS ABS: 0 10*3/uL (ref 0–0.1)
BASOS PCT: 0 %
EOS ABS: 0.1 10*3/uL (ref 0–0.7)
EOS PCT: 1 %
HCT: 38 % (ref 35.0–47.0)
Hemoglobin: 12.8 g/dL (ref 12.0–16.0)
Lymphocytes Relative: 15 %
Lymphs Abs: 1.2 10*3/uL (ref 1.0–3.6)
MCH: 31.5 pg (ref 26.0–34.0)
MCHC: 33.5 g/dL (ref 32.0–36.0)
MCV: 93.8 fL (ref 80.0–100.0)
MONOS PCT: 10 %
Monocytes Absolute: 0.8 10*3/uL (ref 0.2–0.9)
Neutro Abs: 5.8 10*3/uL (ref 1.4–6.5)
Neutrophils Relative %: 74 %
PLATELETS: 124 10*3/uL — AB (ref 150–440)
RBC: 4.05 MIL/uL (ref 3.80–5.20)
RDW: 16.1 % — AB (ref 11.5–14.5)
WBC: 7.8 10*3/uL (ref 3.6–11.0)

## 2017-07-19 LAB — COMPREHENSIVE METABOLIC PANEL
ALK PHOS: 143 U/L — AB (ref 38–126)
ALT: 19 U/L (ref 14–54)
ANION GAP: 9 (ref 5–15)
AST: 25 U/L (ref 15–41)
Albumin: 3.2 g/dL — ABNORMAL LOW (ref 3.5–5.0)
BILIRUBIN TOTAL: 0.7 mg/dL (ref 0.3–1.2)
BUN: 14 mg/dL (ref 6–20)
CALCIUM: 8.8 mg/dL — AB (ref 8.9–10.3)
CO2: 27 mmol/L (ref 22–32)
Chloride: 106 mmol/L (ref 101–111)
Creatinine, Ser: 0.97 mg/dL (ref 0.44–1.00)
GFR calc non Af Amer: 60 mL/min — ABNORMAL LOW (ref >60)
Glucose, Bld: 170 mg/dL — ABNORMAL HIGH (ref 65–99)
POTASSIUM: 3.5 mmol/L (ref 3.5–5.1)
SODIUM: 142 mmol/L (ref 135–145)
TOTAL PROTEIN: 6.5 g/dL (ref 6.5–8.1)

## 2017-07-19 LAB — TSH: TSH: 4.486 u[IU]/mL (ref 0.350–4.500)

## 2017-07-19 LAB — TROPONIN I

## 2017-07-19 MED ORDER — SODIUM CHLORIDE 0.9 % IV SOLN
1.0000 g | Freq: Once | INTRAVENOUS | Status: AC
  Administered 2017-07-20: 1 g via INTRAVENOUS
  Filled 2017-07-19: qty 10

## 2017-07-19 NOTE — ED Triage Notes (Addendum)
Pt arrived via ems from the [facility]. Ems reports pt had an altered mental status at 7pm but facility continued to admin meds at that time. EMS reports pt unresponsive when they arrived breathing 10x min with a o2 of 95%. Pt was placed on a nonrebreather and sating at 100% on arrival, ems placed nasal trumpet in right nare. EMS reports pt last know well was 5pm when pt ambulated to dinner. EMS administered 1.5 narcan in route. Pt only responsive to sternal rub at this time by moaning, unable to answer questions at this time.

## 2017-07-19 NOTE — ED Provider Notes (Signed)
[facility] Emergency Department Provider Note  ___________________________________________   First MD Initiated Contact with Patient 07/19/17 2244     (approximate)  I have reviewed the triage vital signs and the nursing notes.   HISTORY  Chief Complaint Altered Mental Status   HPI Stephanie [name] is a 67 y.o. female with a history of aortic dissection, cirrhosis as well as TIA who is presenting to the emergency department altered mental status.  Per EMS, the patient was last seen normal at 5 PM when she was walking to dinner.  1 hour prior to EMS arrival the patient had slurred speech and was reported to be altered but then was given her evening medication regardless.  EMS was called after the patient being unresponsive.  EMS found the patient to be with normal vital signs but secondary to the patient being unresponsive EMS started a nasal trumpet in place the patient on a nonrebreather mask.  There was also a question of a medication error at the patient's skilled nursing facility as relayed to the medics by a med tech at the [place].  Glucose was found to be above 200 in the field.  1.5 mg of Narcan also administered with minimal response.   Past Medical History:  Diagnosis Date  . Adenomatous colon polyp 08/27/2014   Polyps x 3  . Anemia   . Anxiety   . 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 Arthritis   . Asthma   . Bipolar affective disorder ([place])    h/o  . Blood transfusion 2000  . Blood transfusion without reported diagnosis   . Cancer ([place])   . Chronic abdominal pain    abdominal wall pain  . 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 Colon polyp   . Depression   . Diabetes mellitus without complication ([place])   . Fatty liver 04/09/08   found in abd CT  . GERD (gastroesophageal reflux disease)   . H/O: CVA (cardiovascular accident)    TIA  . H/O: rheumatic fever   . Hepatitis    HEP "B" twenty years ago  . Hyperlipidemia   . Incontinence   .  Insomnia   . Lower extremity edema   . Obstructive sleep apnea    Use C-PAP twice per week  . Platelets decreased ([place])   . RLS (restless legs syndrome)   . Shortness of breath   . Sleep apnea   . Stroke [facility])    TIA- 2002  . Ulcer   . Unspecified disorders of nervous system     Patient Active Problem List   Diagnosis Date Noted  . Elevated alkaline phosphatase level 04/16/2017  . Altered mental status 03/07/2017  . Degenerative tear of lateral meniscus of right knee 11/15/2016  . Encephalopathy 07/19/2016  . Degenerative tear of medial meniscus of right knee 07/01/2016  . Primary osteoarthritis of right knee 07/01/2016  . Diabetes mellitus type 1, uncontrolled, without complications ([place]) 01/77/9390  . Lymphedema 06/14/2016  . Pain in limb 05/06/2016  . Swelling of limb 05/06/2016  . Postphlebitic syndrome with inflammation 05/06/2016  . Pneumonia 04/18/2016  . Chest pain 04/04/2016  . Iron deficiency anemia 01/30/2016  . Bilateral carotid artery stenosis 01/12/2016  . Hypoglycemia 09/09/2015  . Hyperglycemia 07/16/2015  . Neuropathy 06/08/2015  . Thrombocytopenia ([place]) 08/19/2014  . Abdominal pain, chronic, epigastric 06/12/2014  . Liver cirrhosis secondary to NASH ([place]) 02/27/2014  . Dizziness and giddiness 10/30/2013  .  DOE (dyspnea on exertion) 10/30/2013  . Poorly controlled type 2 diabetes mellitus with complication ([place]) 16/03/9603  . DNR (do not resuscitate) 07/29/2013  . Encounter for routine gynecological examination 07/29/2013  . Dyspnea 07/10/2013  . Lung nodule 07/10/2013  . Varicose veins of left lower extremity 10/09/2012  . GERD (gastroesophageal reflux disease) 10/09/2012  . Lower extremity edema 02/02/2012  . Cirrhosis of liver ([place]) 02/02/2012  . Anxiety 07/11/2011  . Aortic dissection ([place])   . BACK PAIN, LUMBAR, CHRONIC 11/19/2009  . UNS ADVRS EFF OTH RX MEDICINAL&BIOLOGICAL SBSTNC 12/16/2008  . UNSPECIFIED VITAMIN D DEFICIENCY 09/26/2008  .  TOBACCO ABUSE 06/19/2008  . RESTLESS LEGS SYNDROME 04/27/2007  . INSOMNIA 04/27/2007  . HLD (hyperlipidemia) 03/08/2007  . OSA (obstructive sleep apnea) 03/08/2007  . Essential hypertension 03/08/2007  . Asthma 03/08/2007  . BIPOLAR AFFECTIVE DISORDER, HX OF 03/08/2007    Past Surgical History:  Procedure Laterality Date  . ABDOMINAL EXPLORATION SURGERY    . ABDOMINAL HYSTERECTOMY     total  . Axillary artery cannulation via 8-mm Hemashield graft, median sternotomy, extracorporeal circulation with deep hypothermic circulatory arrest, repair of  aortic dessection  01/23/2009   Dr Arlyce Dice  . BACK SURGERY      x 5  . cataract     both eyes  . CHOLECYSTECTOMY    . COLONOSCOPY    . EYE SURGERY Bilateral    Cataract Extraction with IOL  . HEMORROIDECTOMY     and colon polyp removed  . KNEE ARTHROSCOPY WITH LATERAL RELEASE Right 11/15/2016   Procedure: KNEE ARTHROSCOPY WITH LATERAL RELEASE;  Surgeon: Corky Mull, MD;  Location: [facility] ORS;  Service: Orthopedics;  Laterality: Right;  . LIVER BIOPSY  04/10/2012   Procedure: LIVER BIOPSY;  Surgeon: Inda Castle, MD;  Location: [facility];  Service: Endoscopy;  Laterality: N/A;  ultrasound to mark order for abd limited/liver to be marked to be sent by [email or web address]   . LUMBAR FUSION  10/09  . LUMBAR WOUND DEBRIDEMENT  05/09/2011   Procedure: LUMBAR WOUND DEBRIDEMENT;  Surgeon: Dahlia Bailiff;  Location: [place];  Service: Orthopedics;  Laterality: N/A;  IRRIGATION AND DEBRIDEMENT SPINAL WOUND  . POSTERIOR CERVICAL FUSION/FORAMINOTOMY    . ROTATOR CUFF REPAIR     left    Prior to Admission medications   Medication Sig Start Date End Date Taking? Authorizing Provider  albuterol (PROVENTIL HFA;VENTOLIN HFA) 108 (90 Base) MCG/ACT inhaler Inhale 2 puffs into the lungs every 6 (six) hours as needed for wheezing or shortness of breath. 07/28/16   Alfred Levins, [place], MD  albuterol (PROVENTIL) (2.5 MG/3ML) 0.083% nebulizer solution Take 2.5 mg by  nebulization every 6 (six) hours as needed for wheezing or shortness of breath.    [provider]  alum & mag hydroxide-simeth (ANTACID) 200-200-20 MG/5ML suspension Take 30 mLs by mouth every 4 (four) hours as needed for indigestion or heartburn.    [provider]  aspirin 325 MG tablet Take 325 mg by mouth daily.     [provider]  atorvastatin (LIPITOR) 40 MG tablet  03/07/17   [provider]  bacitracin-polymyxin b (POLYSPORIN) ointment Apply topically 3 (three) times daily. Apply to left earlobe    [provider]  barrier cream (NON-SPECIFIED) CREA Apply 1 application topically as needed (APPLIED AFTER TOILETING TO PREVENT SKIN BREAKDOWN).    [provider]  budesonide-formoterol (SYMBICORT) 160-4.5 MCG/ACT inhaler Inhale 2 puffs into the lungs 2 (two) times daily. (0800 & 2000)  [provider]  buPROPion (WELLBUTRIN SR) 150 MG 12 hr tablet Take 150 mg by mouth daily. (0800)    [provider]  clonazePAM (KLONOPIN) 0.5 MG tablet Take by mouth.    [provider]  cyclobenzaprine (FLEXERIL) 10 MG tablet Take 1 tablet (10 mg total) by mouth 3 (three) times daily as needed for muscle spasms. 06/16/17   Merlyn Lot, MD  diclofenac sodium (VOLTAREN) 1 % GEL Apply topically. 02/02/17   [provider]  dicyclomine (BENTYL) 10 MG capsule Take 10 mg by mouth 3 (three) times daily. (0800, 1300 & 2000)    [provider]  diphenhydrAMINE (DIPHENHIST) 25 mg capsule Take 25 mg by mouth every 6 (six) hours as needed (for allergic reaction (notify provider is symptoms persist more than 24 hrs*).    [provider]  Emollient (EUCERIN INTENSIVE REPAIR) LOTN Apply 1 application topically 2 (two) times daily.    [provider]  ferrous sulfate 325 (65 FE) MG tablet Take 325 mg by mouth 2 (two) times daily. (0800 & 2000)    [provider]  furosemide (LASIX) 80 MG tablet  Take 80 mg by mouth daily. (0800)    [provider]  gabapentin (NEURONTIN) 300 MG capsule Take 900 mg by mouth at bedtime.    [provider]  gabapentin (NEURONTIN) 600 MG tablet Take 600 mg by mouth daily. Take 600 mg by mouth in the morning and 900 mg by mouth at bedtime.    [provider]  guaifenesin (ROBITUSSIN) 100 MG/5ML syrup Take 300 mg by mouth every 6 (six) hours as needed for cough.     [provider]  insulin glargine (LANTUS) 100 UNIT/ML injection Inject 28 Units into the skin at bedtime.    [provider]  insulin lispro (HUMALOG KWIKPEN) 100 UNIT/ML KiwkPen Inject 0-12 Units into the skin 3 (three) times daily. Check FSBS before each meal & cover with SSI (sliding scale insulin)     [provider]  lactulose (CHRONULAC) 10 GM/15ML solution Take 10 g by mouth every Monday, Wednesday, and Friday.  10/17/16   [provider]  lamoTRIgine (LAMICTAL) 100 MG tablet Take 100 mg by mouth 2 (two) times daily. (0800 & 2000)    [provider]  lidocaine (LIDODERM) 5 % Place 3 patches onto the skin daily. Apply to back 02/01/17   [provider]  lidocaine (LIDODERM) 5 % Place 1 patch onto the skin every 12 (twelve) hours. Remove & Discard patch within 12 hours or as directed by MD 06/16/17 06/16/18  Merlyn Lot, MD  loperamide (IMODIUM) 2 MG capsule Take 4 mg by mouth as needed for diarrhea or loose stools. *Do not exceed 8 doses in 24 hours*    [provider]  LYRICA 150 MG capsule Take 150 mg by mouth 3 (three) times daily. 11/29/16   [provider]  magnesium hydroxide (MILK OF MAGNESIA) 400 MG/5ML suspension Take 30 mLs by mouth daily as needed (for constipation).    [provider]  Menthol-Zinc Oxide (GOLD BOND EX) Apply 1 application topically daily.    [provider]  metoCLOPramide (REGLAN) 5 MG tablet Take 5 mg by mouth 4 (four) times daily -  before meals and  at bedtime. (0800, 1200, 1600 & 2000)    [provider]  mirtazapine (REMERON) 45 MG tablet Take 45 mg by mouth at bedtime. (2000)     [provider]  omeprazole (PRILOSEC) 20  MG capsule Take by mouth. 04/16/14   [provider]  ondansetron (ZOFRAN) 4 MG tablet Take 4 mg by mouth every 4 (four) hours as needed for nausea or vomiting.    [provider]  oxybutynin (DITROPAN-XL) 5 MG 24 hr tablet Take 5 mg by mouth daily. (0800)    [provider]  oxyCODONE (ROXICODONE) 5 MG immediate release tablet Take 1 tablet (5 mg total) by mouth every 4 (four) hours as needed for severe pain. Patient not taking: Reported on 07/04/2017 03/11/17   Henreitta Leber, MD  potassium chloride SA (K-DUR,KLOR-CON) 20 MEQ tablet Take 20 mEq by mouth daily. (0800)    [provider]  pyrithione zinc (HEAD AND SHOULDERS) 1 % shampoo Apply 1 application topically daily. t-gel or head and shoulders.    [provider]  rOPINIRole (REQUIP) 3 MG tablet Take 3 mg by mouth at bedtime. (2000)    [provider]  simvastatin (ZOCOR) 10 MG tablet Take 10 mg by mouth at bedtime. (2000)    [provider]  Skin Protectants, Misc. (EUCERIN) cream Apply topically 2 (two) times daily.    [provider]  spironolactone (ALDACTONE) 25 MG tablet Take 25 mg by mouth daily. (0800)    [provider]  traMADol (ULTRAM) 50 MG tablet Take 50 mg by mouth 4 (four) times daily.    [provider]  vitamin C (ASCORBIC ACID) 500 MG tablet Take 500 mg by mouth 2 (two) times daily. (0800 & 2000)    [provider]  Xylitol (XYLIMELTS) 500 MG DISK Use as directed 1 capsule in the mouth or throat 3 (three) times daily.    [provider]  zolpidem (AMBIEN) 5 MG tablet Take 5 mg by mouth at bedtime. (2000)    [provider]    Allergies Doxycycline  Family History  Problem Relation Age of Onset  . Breast cancer  Mother   . Lung cancer Mother   . Stomach cancer Father   . Diabetes Unknown        4 aunts, and 1 uncle  . Colon cancer Neg Hx   . Esophageal cancer Neg Hx   . Rectal cancer Neg Hx     Social History Social History   Tobacco Use  . Smoking status: Former Smoker    Packs/day: 1.50    Years: 50.00    Pack years: 75.00    Types: Cigarettes    Last attempt to quit: 04/06/2013    Years since quitting: 4.2  . Smokeless tobacco: Never Used  Substance Use Topics  . Alcohol use: No    Alcohol/week: 0.0 oz  . Drug use: No    Review of Systems  Level 5 caveat secondary to altered mentation.  ____________________________________________   PHYSICAL EXAM:  VITAL SIGNS: ED Triage Vitals  Enc Vitals Group     BP 07/19/17 2253 136/78     Pulse Rate 07/19/17 2253 84     Resp 07/19/17 2253 (!) 9     Temp 07/19/17 2302 (!) 95.2 F (35.1 C)     Temp Source 07/19/17 2302 Rectal     SpO2 07/19/17 2241 95 %     Weight 07/19/17 2255 225 lb (102.1 kg)     Height 07/19/17 2255 5' 3"  (1.6 m)     Head Circumference --      Peak Flow --      Pain Score --      Pain Loc --  Pain Edu? --      Excl. in [place]? --     Constitutional: GCS of 11-12.  With deep sternal rub the patient is able to tell me her name.  Also intermittently squeezes my hand when asked.  However, quickly becomes unresponsive when sternal rubbing is stopped. Eyes: Conjunctivae are normal.  Pupils are PERRLA. Head: Atraumatic. Nose: No congestion/rhinnorhea. Mouth/Throat: Mucous membranes are moist.  Neck: No stridor.   Cardiovascular: Normal rate, regular rhythm. Grossly normal heart sounds.  Respiratory: Normal respiratory effort.  No retractions. Lungs CTAB. Gastrointestinal: Soft and nontender. No distention.  Musculoskeletal: No lower extremity tenderness nor edema.  No joint effusions. Neurologic: GCS of 11-12.  Also with positive gag reflex. Skin:  Skin is warm, dry and intact. No rash  noted.   ____________________________________________   LABS (all labs ordered are listed, but only abnormal results are displayed)  Labs Reviewed  CBC WITH DIFFERENTIAL/PLATELET - Abnormal; Notable for the following components:      Result Value   RDW 16.1 (*)    Platelets 124 (*)    All other components within normal limits  URINALYSIS, ROUTINE W REFLEX MICROSCOPIC - Abnormal; Notable for the following components:   Color, Urine YELLOW (*)    APPearance CLOUDY (*)    Leukocytes, UA MODERATE (*)    Bacteria, UA RARE (*)    Non Squamous Epithelial 0-5 (*)    All other components within normal limits  CULTURE, BLOOD (ROUTINE X 2)  CULTURE, BLOOD (ROUTINE X 2)  URINE CULTURE  LACTIC ACID, PLASMA  COMPREHENSIVE METABOLIC PANEL  LACTIC ACID, PLASMA  TROPONIN I  AMMONIA  TSH   ____________________________________________  EKG  ED ECG REPORT I, Doran Stabler, the attending physician, personally viewed and interpreted this ECG.   Date: 07/19/2017  EKG Time: 2240  Rate: 88  Rhythm: normal sinus rhythm  Axis: normal  Intervals:none  ST&T Change: No ST segment elevation or depression.  No abnormal T wave inversion.  ____________________________________________  RADIOLOGY  Pending x-ray as well as CT results. ____________________________________________   PROCEDURES  Procedure(s) performed:   .Critical Care Performed by: Orbie Pyo, MD Authorized by: Orbie Pyo, MD   Critical care provider statement:    Critical care time (minutes):  35   Critical care was necessary to treat or prevent imminent or life-threatening deterioration of the following conditions:  CNS failure or compromise   Critical care was time spent personally by me on the following activities:  Ordering and performing treatments and interventions, ordering and review of laboratory studies and pulse oximetry    Critical Care performed:    ____________________________________________   INITIAL IMPRESSION / ASSESSMENT AND PLAN / ED COURSE  Pertinent labs & imaging results that were available during my care of the patient were reviewed by me and considered in my medical decision making (see chart for details).  Differential diagnosis includes, but is not limited to, alcohol, illicit or prescription medications, or other toxic ingestion; intracranial pathology such as stroke or intracerebral hemorrhage; fever or infectious causes including sepsis; hypoxemia and/or hypercarbia; uremia; trauma; endocrine related disorders such as diabetes, hypoglycemia, and thyroid-related diseases; hypertensive encephalopathy; etc. As part of my medical decision making, I reviewed the following data within the electronic MEDICAL RECORD NUMBER Notes from prior ED visits  ----------------------------------------- 11:55 PM on 07/19/2017 -----------------------------------------  This time remains responsive to sternal rubbing.  Likely UTI.  Rocephin ordered.  Remaining workup is pending.  Signed out to Dr. Archie Balboa.  On review of the patient's previous charts she had a similar admission this past October with altered mental status which was attributed to polypharmacy.  At this time with the patient's vital signs being stable with a gag reflex present we will hold on intubation.  Likely admission to ICU.       ____________________________________________   FINAL CLINICAL IMPRESSION(S) / ED DIAGNOSES  Altered mental status.  UTI.    NEW MEDICATIONS STARTED DURING THIS VISIT:  New Prescriptions   No medications on file     Note:  This document was prepared using Dragon voice recognition software and may include unintentional dictation errors.  Edwena Bunde, MD 07/19/17 [phone]

## 2017-07-19 NOTE — ED Notes (Signed)
Patient transported to CT 

## 2017-07-19 NOTE — ED Notes (Signed)
bair hugger placed on pt per MD request

## 2017-07-20 ENCOUNTER — Encounter: Payer: Self-pay | Admitting: Internal Medicine

## 2017-07-20 ENCOUNTER — Other Ambulatory Visit: Payer: Self-pay

## 2017-07-20 DIAGNOSIS — Z9842 Cataract extraction status, left eye: Secondary | ICD-10-CM | POA: Diagnosis not present

## 2017-07-20 DIAGNOSIS — G9341 Metabolic encephalopathy: Secondary | ICD-10-CM | POA: Diagnosis present

## 2017-07-20 DIAGNOSIS — G4733 Obstructive sleep apnea (adult) (pediatric): Secondary | ICD-10-CM | POA: Diagnosis present

## 2017-07-20 DIAGNOSIS — N3000 Acute cystitis without hematuria: Secondary | ICD-10-CM | POA: Diagnosis not present

## 2017-07-20 DIAGNOSIS — N390 Urinary tract infection, site not specified: Secondary | ICD-10-CM | POA: Diagnosis present

## 2017-07-20 DIAGNOSIS — F419 Anxiety disorder, unspecified: Secondary | ICD-10-CM | POA: Diagnosis not present

## 2017-07-20 DIAGNOSIS — T68XXXA Hypothermia, initial encounter: Secondary | ICD-10-CM | POA: Diagnosis present

## 2017-07-20 DIAGNOSIS — E11649 Type 2 diabetes mellitus with hypoglycemia without coma: Secondary | ICD-10-CM | POA: Diagnosis present

## 2017-07-20 DIAGNOSIS — Z794 Long term (current) use of insulin: Secondary | ICD-10-CM | POA: Diagnosis not present

## 2017-07-20 DIAGNOSIS — A419 Sepsis, unspecified organism: Secondary | ICD-10-CM | POA: Diagnosis present

## 2017-07-20 DIAGNOSIS — E2740 Unspecified adrenocortical insufficiency: Secondary | ICD-10-CM | POA: Diagnosis present

## 2017-07-20 DIAGNOSIS — K7460 Unspecified cirrhosis of liver: Secondary | ICD-10-CM | POA: Diagnosis present

## 2017-07-20 DIAGNOSIS — Z79899 Other long term (current) drug therapy: Secondary | ICD-10-CM | POA: Diagnosis not present

## 2017-07-20 DIAGNOSIS — Z9841 Cataract extraction status, right eye: Secondary | ICD-10-CM | POA: Diagnosis not present

## 2017-07-20 DIAGNOSIS — B1910 Unspecified viral hepatitis B without hepatic coma: Secondary | ICD-10-CM | POA: Diagnosis present

## 2017-07-20 DIAGNOSIS — Z7982 Long term (current) use of aspirin: Secondary | ICD-10-CM | POA: Diagnosis not present

## 2017-07-20 DIAGNOSIS — Z881 Allergy status to other antibiotic agents status: Secondary | ICD-10-CM | POA: Diagnosis not present

## 2017-07-20 DIAGNOSIS — Z87891 Personal history of nicotine dependence: Secondary | ICD-10-CM | POA: Diagnosis not present

## 2017-07-20 DIAGNOSIS — E785 Hyperlipidemia, unspecified: Secondary | ICD-10-CM | POA: Diagnosis present

## 2017-07-20 DIAGNOSIS — J45909 Unspecified asthma, uncomplicated: Secondary | ICD-10-CM | POA: Diagnosis present

## 2017-07-20 DIAGNOSIS — K7290 Hepatic failure, unspecified without coma: Secondary | ICD-10-CM | POA: Diagnosis present

## 2017-07-20 DIAGNOSIS — R4182 Altered mental status, unspecified: Secondary | ICD-10-CM | POA: Diagnosis present

## 2017-07-20 DIAGNOSIS — Z8673 Personal history of transient ischemic attack (TIA), and cerebral infarction without residual deficits: Secondary | ICD-10-CM | POA: Diagnosis not present

## 2017-07-20 DIAGNOSIS — Z981 Arthrodesis status: Secondary | ICD-10-CM | POA: Diagnosis not present

## 2017-07-20 DIAGNOSIS — Z7951 Long term (current) use of inhaled steroids: Secondary | ICD-10-CM | POA: Diagnosis not present

## 2017-07-20 DIAGNOSIS — G2581 Restless legs syndrome: Secondary | ICD-10-CM | POA: Diagnosis present

## 2017-07-20 LAB — BASIC METABOLIC PANEL
ANION GAP: 8 (ref 5–15)
BUN: 14 mg/dL (ref 6–20)
CHLORIDE: 108 mmol/L (ref 101–111)
CO2: 27 mmol/L (ref 22–32)
Calcium: 8.2 mg/dL — ABNORMAL LOW (ref 8.9–10.3)
Creatinine, Ser: 0.85 mg/dL (ref 0.44–1.00)
GFR calc Af Amer: >60 mL/min (ref >60)
Glucose, Bld: 114 mg/dL — ABNORMAL HIGH (ref 65–99)
POTASSIUM: 3.5 mmol/L (ref 3.5–5.1)
SODIUM: 143 mmol/L (ref 135–145)

## 2017-07-20 LAB — URINE DRUG SCREEN, QUALITATIVE (ARMC ONLY)
Amphetamines, Ur Screen: NOT DETECTED
BARBITURATES, UR SCREEN: NOT DETECTED
BENZODIAZEPINE, UR SCRN: NOT DETECTED
CANNABINOID 50 NG, UR ~~LOC~~: NOT DETECTED
Cocaine Metabolite,Ur ~~LOC~~: NOT DETECTED
MDMA (Ecstasy)Ur Screen: NOT DETECTED
METHADONE SCREEN, URINE: NOT DETECTED
Opiate, Ur Screen: NOT DETECTED
Phencyclidine (PCP) Ur S: NOT DETECTED
Tricyclic, Ur Screen: POSITIVE — AB

## 2017-07-20 LAB — CBC
HEMATOCRIT: 34.8 % — AB (ref 35.0–47.0)
HEMOGLOBIN: 11.8 g/dL — AB (ref 12.0–16.0)
MCH: 31.9 pg (ref 26.0–34.0)
MCHC: 33.9 g/dL (ref 32.0–36.0)
MCV: 94.1 fL (ref 80.0–100.0)
Platelets: 108 10*3/uL — ABNORMAL LOW (ref 150–440)
RBC: 3.7 MIL/uL — ABNORMAL LOW (ref 3.80–5.20)
RDW: 16 % — ABNORMAL HIGH (ref 11.5–14.5)
WBC: 7.3 10*3/uL (ref 3.6–11.0)

## 2017-07-20 LAB — GLUCOSE, CAPILLARY
GLUCOSE-CAPILLARY: 94 mg/dL (ref 65–99)
GLUCOSE-CAPILLARY: 148 mg/dL — AB (ref 65–99)
GLUCOSE-CAPILLARY: 73 mg/dL (ref 65–99)
Glucose-Capillary: 112 mg/dL — ABNORMAL HIGH (ref 65–99)
Glucose-Capillary: 115 mg/dL — ABNORMAL HIGH (ref 65–99)

## 2017-07-20 LAB — AMMONIA
AMMONIA: 38 umol/L — AB (ref 9–35)
AMMONIA: 34 umol/L (ref 9–35)

## 2017-07-20 LAB — SALICYLATE LEVEL

## 2017-07-20 LAB — MRSA PCR SCREENING: MRSA BY PCR: NEGATIVE

## 2017-07-20 LAB — ACETAMINOPHEN LEVEL

## 2017-07-20 LAB — PROCALCITONIN

## 2017-07-20 LAB — LACTIC ACID, PLASMA: LACTIC ACID, VENOUS: 1.1 mmol/L (ref 0.5–1.9)

## 2017-07-20 LAB — CORTISOL-AM, BLOOD: CORTISOL - AM: 0.7 ug/dL — AB (ref 6.7–22.6)

## 2017-07-20 MED ORDER — SODIUM CHLORIDE 0.9 % IV SOLN
1.0000 g | INTRAVENOUS | Status: DC
  Administered 2017-07-20 – 2017-07-21 (×2): 1 g via INTRAVENOUS
  Filled 2017-07-20 (×4): qty 10

## 2017-07-20 MED ORDER — VANCOMYCIN HCL IN DEXTROSE 1-5 GM/200ML-% IV SOLN
1000.0000 mg | Freq: Two times a day (BID) | INTRAVENOUS | Status: DC
  Administered 2017-07-20: 1000 mg via INTRAVENOUS
  Filled 2017-07-20 (×2): qty 200

## 2017-07-20 MED ORDER — VANCOMYCIN HCL IN DEXTROSE 1-5 GM/200ML-% IV SOLN
1000.0000 mg | Freq: Once | INTRAVENOUS | Status: AC
  Administered 2017-07-20: 1000 mg via INTRAVENOUS
  Filled 2017-07-20: qty 200

## 2017-07-20 MED ORDER — SODIUM CHLORIDE 0.9 % IV SOLN
INTRAVENOUS | Status: DC
  Administered 2017-07-20: 04:00:00 via INTRAVENOUS

## 2017-07-20 MED ORDER — SODIUM CHLORIDE 0.9 % IV BOLUS (SEPSIS)
1000.0000 mL | Freq: Once | INTRAVENOUS | Status: AC
  Administered 2017-07-20: 1000 mL via INTRAVENOUS

## 2017-07-20 MED ORDER — LAMOTRIGINE 100 MG PO TABS
100.0000 mg | ORAL_TABLET | Freq: Two times a day (BID) | ORAL | Status: DC
  Administered 2017-07-20 – 2017-07-22 (×4): 100 mg via ORAL
  Filled 2017-07-20 (×2): qty 1
  Filled 2017-07-20 (×2): qty 4

## 2017-07-20 MED ORDER — PIPERACILLIN-TAZOBACTAM 3.375 G IVPB
3.3750 g | Freq: Three times a day (TID) | INTRAVENOUS | Status: DC
  Administered 2017-07-20 (×2): 3.375 g via INTRAVENOUS
  Filled 2017-07-20 (×2): qty 50

## 2017-07-20 MED ORDER — FERROUS SULFATE 325 (65 FE) MG PO TABS
325.0000 mg | ORAL_TABLET | Freq: Two times a day (BID) | ORAL | Status: DC
  Administered 2017-07-20 – 2017-07-22 (×5): 325 mg via ORAL
  Filled 2017-07-20 (×5): qty 1

## 2017-07-20 MED ORDER — ALBUTEROL SULFATE (2.5 MG/3ML) 0.083% IN NEBU: 2.5000 mg | INHALATION_SOLUTION | Freq: Four times a day (QID) | RESPIRATORY_TRACT | Status: DC | PRN

## 2017-07-20 MED ORDER — DEXTROSE 50 % IV SOLN
50.0000 mL | Freq: Once | INTRAVENOUS | Status: AC
  Administered 2017-07-20: 50 mL via INTRAVENOUS

## 2017-07-20 MED ORDER — SODIUM CHLORIDE 0.9 % IV SOLN: 1.0000 g | Freq: Once | INTRAVENOUS | Status: DC

## 2017-07-20 MED ORDER — SODIUM CHLORIDE 0.9 % IV SOLN: 2.0000 g | INTRAVENOUS | Status: DC

## 2017-07-20 MED ORDER — BUPROPION HCL ER (SR) 150 MG PO TB12
150.0000 mg | ORAL_TABLET | Freq: Every day | ORAL | Status: DC
  Administered 2017-07-20 – 2017-07-21 (×2): 150 mg via ORAL
  Filled 2017-07-20 (×2): qty 1

## 2017-07-20 MED ORDER — ONDANSETRON HCL 4 MG PO TABS: 4.0000 mg | ORAL_TABLET | Freq: Four times a day (QID) | ORAL | Status: DC | PRN

## 2017-07-20 MED ORDER — DEXTROSE-NACL 5-0.9 % IV SOLN
INTRAVENOUS | Status: DC
  Administered 2017-07-20 – 2017-07-21 (×2): via INTRAVENOUS

## 2017-07-20 MED ORDER — DEXTROSE 50 % IV SOLN
INTRAVENOUS | Status: AC
  Administered 2017-07-20: 50 mL via INTRAVENOUS
  Filled 2017-07-20: qty 50

## 2017-07-20 MED ORDER — ONDANSETRON HCL 4 MG/2ML IJ SOLN: 4.0000 mg | Freq: Four times a day (QID) | INTRAMUSCULAR | Status: DC | PRN

## 2017-07-20 MED ORDER — INSULIN ASPART 100 UNIT/ML ~~LOC~~ SOLN
0.0000 [IU] | Freq: Three times a day (TID) | SUBCUTANEOUS | Status: DC
  Administered 2017-07-20: 2 [IU] via SUBCUTANEOUS
  Administered 2017-07-21: 3 [IU] via SUBCUTANEOUS
  Administered 2017-07-21: 5 [IU] via SUBCUTANEOUS
  Administered 2017-07-21: 1 [IU] via SUBCUTANEOUS
  Administered 2017-07-22: 8 [IU] via SUBCUTANEOUS
  Filled 2017-07-20 (×5): qty 1

## 2017-07-20 MED ORDER — DOUBLE ANTIBIOTIC 500-10000 UNIT/GM EX OINT
TOPICAL_OINTMENT | Freq: Three times a day (TID) | CUTANEOUS | Status: DC
  Administered 2017-07-20: 1 via TOPICAL
  Administered 2017-07-20 – 2017-07-22 (×6): via TOPICAL
  Filled 2017-07-20 (×2): qty 28.4

## 2017-07-20 MED ORDER — ATORVASTATIN CALCIUM 20 MG PO TABS
40.0000 mg | ORAL_TABLET | Freq: Every day | ORAL | Status: DC
  Administered 2017-07-21: 40 mg via ORAL
  Filled 2017-07-20: qty 2

## 2017-07-20 MED ORDER — INSULIN ASPART 100 UNIT/ML ~~LOC~~ SOLN
0.0000 [IU] | Freq: Every day | SUBCUTANEOUS | Status: DC
  Administered 2017-07-21: 2 [IU] via SUBCUTANEOUS
  Filled 2017-07-20: qty 1

## 2017-07-20 MED ORDER — ACETAMINOPHEN 325 MG PO TABS
650.0000 mg | ORAL_TABLET | Freq: Four times a day (QID) | ORAL | Status: DC | PRN
  Administered 2017-07-21: 650 mg via ORAL
  Filled 2017-07-20: qty 2

## 2017-07-20 MED ORDER — ASPIRIN 325 MG PO TABS
325.0000 mg | ORAL_TABLET | Freq: Every day | ORAL | Status: DC
  Administered 2017-07-20 – 2017-07-22 (×3): 325 mg via ORAL
  Filled 2017-07-20 (×3): qty 1

## 2017-07-20 MED ORDER — LACTULOSE 10 GM/15ML PO SOLN
30.0000 g | Freq: Three times a day (TID) | ORAL | Status: DC
  Administered 2017-07-20 – 2017-07-21 (×3): 30 g via ORAL
  Filled 2017-07-20 (×6): qty 60

## 2017-07-20 MED ORDER — MOMETASONE FURO-FORMOTEROL FUM 100-5 MCG/ACT IN AERO
2.0000 | INHALATION_SPRAY | Freq: Two times a day (BID) | RESPIRATORY_TRACT | Status: DC
  Administered 2017-07-20 – 2017-07-22 (×5): 2 via RESPIRATORY_TRACT
  Filled 2017-07-20 (×2): qty 8.8

## 2017-07-20 MED ORDER — ENOXAPARIN SODIUM 40 MG/0.4ML ~~LOC~~ SOLN
40.0000 mg | SUBCUTANEOUS | Status: DC
  Administered 2017-07-20 – 2017-07-22 (×3): 40 mg via SUBCUTANEOUS
  Filled 2017-07-20 (×3): qty 0.4

## 2017-07-20 MED ORDER — ACETAMINOPHEN 650 MG RE SUPP: 650.0000 mg | Freq: Four times a day (QID) | RECTAL | Status: DC | PRN

## 2017-07-20 MED ORDER — ROPINIROLE HCL 1 MG PO TABS
3.0000 mg | ORAL_TABLET | Freq: Every day | ORAL | Status: DC
  Administered 2017-07-20 – 2017-07-21 (×2): 3 mg via ORAL
  Filled 2017-07-20 (×3): qty 3

## 2017-07-20 MED ORDER — OXYBUTYNIN CHLORIDE ER 5 MG PO TB24
5.0000 mg | ORAL_TABLET | Freq: Every day | ORAL | Status: DC
  Administered 2017-07-20 – 2017-07-22 (×3): 5 mg via ORAL
  Filled 2017-07-20 (×3): qty 1

## 2017-07-20 NOTE — Progress Notes (Signed)
PT Cancellation Note  Patient Details Name: TAMBRA MULLER MRN: [id number] DOB: [birth year]   Cancelled Treatment:     Per RN, pt has not awakened or responded to stimuli today. PT attempted and was unable to awaken pt.  Will re-attempt evaluation 07/21/2017.   Roxanne Gates, PT, DPT 07/20/2017, 2:15 PM

## 2017-07-20 NOTE — Progress Notes (Signed)
[place] [facility]                                                                                                                                                                                  Patient Demographics   Stephanie [name], is a 67 y.o. female, DOB - 11/30/1950, JTT:[id number]  Admit date - 07/19/2017   Admitting Physician Saundra Shelling, MD  Outpatient Primary MD for the patient is Sarajane Jews, MD   LOS - 0  Subjective: Patient admitted with altered mental status continues to be unresponsive    Review of Systems:   CONSTITUTIONAL: Poorly responsive Vitals:   Vitals:   07/20/17 1300 07/20/17 1400 07/20/17 1500 07/20/17 1600  BP: 108/62 (!) 87/55 108/64 (!) 105/56  Pulse: 81 82 81 86  Resp: 10 14 18 14   Temp:      TempSrc:      SpO2: 96% 96% 97% 95%  Weight:      Height:        Wt Readings from Last 3 Encounters:  07/19/17 225 lb (102.1 kg)  07/17/17 225 lb (102.1 kg)  07/04/17 233 lb 9.6 oz (106 kg)     Intake/Output Summary (Last 24 hours) at 07/20/2017 1621 Last data filed at 07/20/2017 1500 Gross per 24 hour  Intake 1212.5 ml  Output 595 ml  Net 617.5 ml    Physical Exam:   GENERAL: Morbidly obese patient chronically ill-appearing HEAD, EYES, EARS, NOSE AND THROAT: Atraumatic, normocephalic. . Pupils equal and reactive to light. Sclerae anicteric. No conjunctival injection. No oro-pharyngeal erythema.  NECK: Supple. There is no jugular venous distention. No bruits, no lymphadenopathy, no thyromegaly.  HEART: Regular rate and rhythm,. No murmurs, no rubs, no clicks.  LUNGS: Clear to auscultation bilaterally. No rales or rhonchi. No wheezes.  ABDOMEN: Soft, flat, nontender, nondistended. Has good bowel sounds. No hepatosplenomegaly appreciated.  EXTREMITIES: No evidence of any cyanosis, clubbing, or peripheral edema.  +2 pedal and radial pulses bilaterally.  NEUROLOGIC: Hard to arouse  sKIN: Moist and warm with no  rashes appreciated.  Psych: Not anxious, depressed LN: No inguinal LN enlargement    Antibiotics   Anti-infectives (From admission, onward)   Start     Dose/Rate Route Frequency Ordered Stop   07/20/17 1800  cefTRIAXone (ROCEPHIN) 1 g in sodium chloride 0.9 % 100 mL IVPB     1 g 200 mL/hr over 30 Minutes Intravenous Every 24 hours 07/20/17 1037     07/20/17 1000  cefTRIAXone (ROCEPHIN) 2 g in sodium chloride 0.9 % 100 mL IVPB  Status:  Discontinued     2 g 200 mL/hr over 30 Minutes Intravenous  Every 24 hours 07/20/17 0151 07/20/17 0223   07/20/17 0900  vancomycin (VANCOCIN) IVPB 1000 mg/200 mL premix  Status:  Discontinued     1,000 mg 200 mL/hr over 60 Minutes Intravenous Every 12 hours 07/20/17 0433 07/20/17 1037   07/20/17 0230  piperacillin-tazobactam (ZOSYN) IVPB 3.375 g  Status:  Discontinued     3.375 g 12.5 mL/hr over 240 Minutes Intravenous Every 8 hours 07/20/17 0224 07/20/17 1037   07/20/17 0230  vancomycin (VANCOCIN) IVPB 1000 mg/200 mL premix     1,000 mg 200 mL/hr over 60 Minutes Intravenous  Once 07/20/17 0224 07/20/17 0617   07/20/17 0200  cefTRIAXone (ROCEPHIN) 1 g in sodium chloride 0.9 % 100 mL IVPB  Status:  Discontinued     1 g 200 mL/hr over 30 Minutes Intravenous  Once 07/20/17 0150 07/20/17 0223   07/20/17 0000  cefTRIAXone (ROCEPHIN) 1 g in sodium chloride 0.9 % 100 mL IVPB     1 g 200 mL/hr over 30 Minutes Intravenous  Once 07/19/17 2348 07/20/17 0139      Medications   Scheduled Meds: . aspirin  325 mg Oral Daily  . atorvastatin  40 mg Oral q1800  . buPROPion  150 mg Oral Daily  . enoxaparin (LOVENOX) injection  40 mg Subcutaneous Q24H  . ferrous sulfate  325 mg Oral BID  . insulin aspart  0-15 Units Subcutaneous TID WC  . insulin aspart  0-5 Units Subcutaneous QHS  . lactulose  30 g Oral TID  . lamoTRIgine  100 mg Oral BID  . mometasone-formoterol  2 puff Inhalation BID  . oxybutynin  5 mg Oral Daily  . polymixin-bacitracin   Topical TID  .  rOPINIRole  3 mg Oral QHS   Continuous Infusions: . sodium chloride 75 mL/hr at 07/20/17 1500  . cefTRIAXone (ROCEPHIN)  IV     PRN Meds:.acetaminophen **OR** acetaminophen, albuterol, ondansetron **OR** ondansetron (ZOFRAN) IV   Data Review:   Micro Results Recent Results (from the past 240 hour(s))  Blood Culture (routine x 2)     Status: None (Preliminary result)   Collection Time: 07/19/17 10:42 PM  Result Value Ref Range Status   Specimen Description BLOOD lLT AC  Final   Special Requests   Final    BOTTLES DRAWN AEROBIC AND ANAEROBIC Blood Culture adequate volume   Culture   Final    NO GROWTH < 12 HOURS Performed at [facility], [address] [place], [place] [address]    Report Status PENDING  Incomplete  Blood Culture (routine x 2)     Status: None (Preliminary result)   Collection Time: 07/19/17 10:42 PM  Result Value Ref Range Status   Specimen Description BLOOD RT FOREARM  Final   Special Requests   Final    BOTTLES DRAWN AEROBIC AND ANAEROBIC Blood Culture results may not be optimal due to an excessive volume of blood received in culture bottles   Culture   Final    NO GROWTH < 12 HOURS Performed at [facility], [address] [place], [place] [address]    Report Status PENDING  Incomplete  MRSA PCR Screening     Status: Abnormal   Collection Time: 07/20/17  2:58 AM  Result Value Ref Range Status   MRSA by PCR (A) NEGATIVE Final    INVALID, UNABLE TO DETERMINE THE PRESENCE OF TARGET DNA DUE TO SPECIMEN INTEGRITY. RECOLLECTION REQUESTED.    Comment: SPOKE WITH MONIQUE ROBERSON AT [id number] 07/20/17 REQUESTED RECOLLECTION  SDR Performed at [facility], [address] [place], [place] [address]   MRSA PCR Screening     Status: None   Collection Time: 07/20/17  7:34 AM  Result Value Ref Range Status   MRSA by PCR NEGATIVE NEGATIVE Final    Comment:        The GeneXpert MRSA Assay (FDA approved for NASAL specimens only), is one  component of a comprehensive MRSA colonization surveillance program. It is not intended to diagnose MRSA infection nor to guide or monitor treatment for MRSA infections. Performed at [facility], [address], [place], [place] [address]     Radiology Reports Dg Pelvis 1-2 Views  Result Date: 07/19/2017 CLINICAL DATA:  67 year old female with altered mental status, unresponsive. EXAM: PELVIS - 1-2 VIEW COMPARISON:  CT Abdomen and Pelvis 04/23/2017 FINDINGS: Femoral heads are normally located. Hip joint spaces remain normal. Proximal femurs appear intact. The pelvis appears stable and intact. Negative SI joints. Chronic postoperative changes to the lower lumbar spine including L3-L4 transpedicular hardware. Negative visible bowel gas pattern. IMPRESSION: No acute findings. Electronically Signed   By: Genevie Ann M.D.   On: 07/19/2017 23:48   Ct Head Wo Contrast  Result Date: 07/19/2017 CLINICAL DATA:  67 year old female with altered mental status, unresponsive. EXAM: CT HEAD WITHOUT CONTRAST TECHNIQUE: Contiguous axial images were obtained from the base of the skull through the vertex without intravenous contrast. COMPARISON:  Head CT 04/23/2017 and earlier. FINDINGS: Brain: Cerebral volume is stable and normal for age. No midline shift, ventriculomegaly, mass effect, evidence of mass lesion, intracranial hemorrhage or evidence of cortically based acute infarction. Gray-white matter differentiation is within normal limits throughout the brain. No cortical encephalomalacia identified. Vascular: Calcified atherosclerosis at the skull base. No suspicious intracranial vascular hyperdensity. Skull: Negative. Sinuses/Orbits: There is a right nasopharyngeal airway in place. Small volume retained secretions in the right nasal cavity and nasopharynx. Visualized paranasal sinuses and mastoids are stable and well pneumatized. Other: No acute orbit or scalp soft tissue findings. IMPRESSION: 1. Stable  and normal for age non contrast CT appearance of the brain. 2. Right nasopharyngeal airway in place. Electronically Signed   By: Genevie Ann M.D.   On: 07/19/2017 23:28   Ct Chest Wo Contrast  Result Date: 07/07/2017 CLINICAL DATA:  Chest pain and shortness of breath for 3 weeks. Follow-up of pulmonary nodule. EXAM: CT CHEST WITHOUT CONTRAST TECHNIQUE: Multidetector CT imaging of the chest was performed following the standard protocol without IV contrast. COMPARISON:  Plain films 06/16/2017.  CT 04/23/2017 FINDINGS: Cardiovascular: Aortic and branch vessel atherosclerosis. Tortuous thoracic aorta. Mild cardiomegaly, without pericardial effusion. Multivessel coronary artery atherosclerosis. Mediastinum/Nodes: Small middle mediastinal nodes are not pathologic by size criteria. Hilar regions poorly evaluated without intravenous contrast. Lungs/Pleura: No pleural fluid. 3 mm right upper lobe pulmonary nodule on image 46/series 5 is not readily apparent on 06/28/2016 exam, but is felt to be similar to 04/23/2017. Mosaic attenuation throughout both lungs. This is favored to be related to mosaic perfusion. Hypoventilation bilaterally. No focal airspace disease. Upper Abdomen: Marked cirrhosis. Incompletely imaged splenomegaly. Normal imaged portions of the stomach, adrenal glands, kidneys. Pancreatic fatty replacement. Musculoskeletal: Dorsal spinal stimulator. Lower cervical spine fixation. IMPRESSION: 1. Mild motion degradation. 2. Mosaic attenuation throughout both lungs, favored to be related to mosaic perfusion. Given low lung volumes and patient body habitus, favored to be related to air trapping. Vascular phenomenon, including chronic pulmonary embolism, could look similar. 3. 3 mm right upper lobe  pulmonary nodule, not readily apparent on exam of 1 year ago. No follow-up needed if patient is low-risk. Non-contrast chest CT can be considered in 12 months if patient is high-risk. This recommendation follows the  consensus statement: Guidelines for Management of Incidental Pulmonary Nodules Detected on CT Images: From the Fleischner Society 2017; Radiology 2017; 284:228-243. 4. Age advanced coronary artery atherosclerosis. Recommend assessment of coronary risk factors and consideration of medical therapy. 5.  Aortic Atherosclerosis (ICD10-I70.0). 6. Cirrhosis and splenomegaly. Electronically Signed   By: Abigail Miyamoto M.D.   On: 07/07/2017 10:21   Dg Chest Port 1 View  Result Date: 07/19/2017 CLINICAL DATA:  67 year old female with altered mental status, unresponsive. EXAM: PORTABLE CHEST 1 VIEW COMPARISON:  Chest CT 07/07/2017, chest radiographs 06/16/2017, and earlier. FINDINGS: Portable AP upright view at 2320 hrs. Large body habitus. Mildly lower lung volumes. Mildly increased hypo ventilation at both lung bases, but no pneumothorax or pleural effusion. No definite pulmonary edema. Stable cardiac size and mediastinal contours. Stable lower thoracic spinal stimulator device and prior cervical ACDF. Paucity of bowel gas in the upper abdomen. IMPRESSION: Large body habitus with lower lung volumes and mildly increased lung base atelectasis compared to radiographs in January. Electronically Signed   By: Genevie Ann M.D.   On: 07/19/2017 23:46   Dg C-arm 1-60 Min-no Report  Result Date: 07/17/2017 Fluoroscopy was utilized by the requesting physician.  No radiographic interpretation.     CBC Recent Labs  Lab 07/19/17 2242 07/20/17 0343  WBC 7.8 7.3  HGB 12.8 11.8*  HCT 38.0 34.8*  PLT 124* 108*  MCV 93.8 94.1  MCH 31.5 31.9  MCHC 33.5 33.9  RDW 16.1* 16.0*  LYMPHSABS 1.2  --   MONOABS 0.8  --   EOSABS 0.1  --   BASOSABS 0.0  --     Chemistries  Recent Labs  Lab 07/19/17 2242 07/20/17 0343  NA 142 143  K 3.5 3.5  CL 106 108  CO2 27 27  GLUCOSE 170* 114*  BUN 14 14  CREATININE 0.97 0.85  CALCIUM 8.8* 8.2*  AST 25  --   ALT 19  --   ALKPHOS 143*  --   BILITOT 0.7  --     ------------------------------------------------------------------------------------------------------------------ estimated creatinine clearance is 74.3 mL/min (by C-G formula based on SCr of 0.85 mg/dL). ------------------------------------------------------------------------------------------------------------------ No results for input(s): HGBA1C in the last 72 hours. ------------------------------------------------------------------------------------------------------------------ No results for input(s): CHOL, HDL, LDLCALC, TRIG, CHOLHDL, LDLDIRECT in the last 72 hours. ------------------------------------------------------------------------------------------------------------------ Recent Labs    07/19/17 2242  TSH 4.486   ------------------------------------------------------------------------------------------------------------------ No results for input(s): VITAMINB12, FOLATE, FERRITIN, TIBC, IRON, RETICCTPCT in the last 72 hours.  Coagulation profile No results for input(s): INR, PROTIME in the last 168 hours.  No results for input(s): DDIMER in the last 72 hours.  Cardiac Enzymes Recent Labs  Lab 07/19/17 2242  TROPONINI <0.03   ------------------------------------------------------------------------------------------------------------------ Invalid input(s): POCBNP    Assessment & Plan  Patient is a 67 year old white female admitted with acute encephalopathy 1.  Acute encephalopathy suspect due to UTI and sepsis 2.  Sepsis due to UTI continue IV antibiotics we will follow urine cultures 3.  Hepatic encephalopathy  mild ammonia level minimally elevated: Lactulose if able to take 4.  Urinary tract infection present on admission follow urine cultures 5.  Asthma continue nebulizers and inhalers as needed 6.  Diabetes type 2 sliding scale insulin 7.  Restless leg syndrome continue Requip 8.  Hyperlipidemia hold simvastatin for now until  more awake 9.  History of  lower extremity edema hold Lasix for now 10.  Miscellaneous SCDs for DVT prophylaxis       Code Status Orders  (From admission, onward)        Start     Ordered   07/20/17 0303  Full code  Continuous     07/20/17 0302    Code Status History    Date Active Date Inactive Code Status Order ID Comments User Context   03/08/2017 01:55 03/11/2017 21:04 Full Code [id number]  Demetrios Loll, MD Inpatient   12/21/2016 11:56 12/22/2016 19:18 Full Code [id number]  Demetrios Loll, MD Inpatient   11/15/2016 16:10 11/15/2016 20:11 Full Code [id number]  Corky Mull, MD Inpatient   07/19/2016 14:54 07/21/2016 17:09 Full Code [id number]  Nicholes Mango, MD Inpatient   04/18/2016 13:11 04/21/2016 14:38 Full Code [id number]  Gladstone Lighter, MD ED   04/04/2016 15:31 04/05/2016 21:38 Full Code [id number]  Loletha Grayer, MD ED   09/10/2015 14:04 09/11/2015 18:33 Full Code [id number]  Yves Dill, RN Inpatient   09/09/2015 23:14 09/10/2015 14:04 DNR [id number]  Demetrios Loll, MD ED    Advance Directive Documentation     Most Recent Value  Type of Advance Directive  Living will  Pre-existing out of facility DNR order (yellow form or pink MOST form)  No data  "MOST" Form in Place?  No data           Consults intensivist  DVT Prophylaxis SCDs  Lab Results  Component Value Date   PLT 108 (L) 07/20/2017     Time Spent in minutes  54mn  Greater than 50% of time spent in care coordination and counseling patient regarding the condition and plan of care.   SDustin FlockM.D on 07/20/2017 at 4:21 PM  Between 7am to 6pm - Pager - [phone]  After 6pm go to [email or web address] - password [email or web address]   Office  [phone]

## 2017-07-20 NOTE — Progress Notes (Signed)
   07/20/17 1200  External Urinary Catheter  Placement Date/Time: 07/20/17 0358   External Urinary Catheter Type: Female  Output (mL) 0 mL  MD Celesta Aver notified of no output, pt may be dehydrated, continue to monitor, order a diet and PT please

## 2017-07-20 NOTE — Progress Notes (Signed)
>  494 cc in urinary bladder per scanner, insert foley per Dr Celesta Aver

## 2017-07-20 NOTE — Clinical Social Work Note (Signed)
07-20-17 CSW received consult that patient is from [facility], CSW to follow patient's progress.  Jones Broom. [place], MSW, [place]  07/20/2017 7:21 PM

## 2017-07-20 NOTE — Progress Notes (Signed)
   07/20/17 1315  Clinical Encounter Type  Visited With Patient not available  Visit Type Initial   Based on information during morning rounds, chaplain stopped in to speak with patient.  Patient appeared to be sleeping and did not respond to knock or chaplain's voice.

## 2017-07-20 NOTE — ED Notes (Signed)
When stimulated pt was able to state name and month but unable to get out any other answers at this time.

## 2017-07-20 NOTE — Consult Note (Signed)
Name: Stephanie [name] MRN: [id number] DOB: 1951-03-21    ADMISSION DATE:  07/19/2017 CONSULTATION DATE: 07/19/2017  REFERRING MD : Dr. Estanislado Pandy   CHIEF COMPLAINT: Altered Mental Status  BRIEF PATIENT DESCRIPTION:  67 yo female with cirrhosis admitted with acute encephalopathy possibly medication induced and sepsis secondary to UTI   SIGNIFICANT EVENTS  02/14-Pt admitted to stepdown unit   STUDIES:  CT Head 02/13>>Stable and normal for age non contrast CT appearance of the brain. Right nasopharyngeal airway in place.  HISTORY OF PRESENT ILLNESS:   This is a 67 yo female with a PMH of TIA, OSA, Restless Leg Syndrome, Insomnia, Hyperlipidemia, Hepatitis B, CVA, GERD, Diabetes Mellitus, Fatty Liver, Depression, Bipolar Disorder, Asthma, Arthritis, Aortic Dissection, Anxiety, and Anemia.  She presented to [facility] ER via EMS from [facility] on 02/13 with altered mental status.  Per ER notes the pt developed AMS with slurred speech around 1900 on 12/13, and was administered medications at that time by med tech at nursing facility. She was last seen at baseline at 1700 on 02/13 when she ambulated to dinner. Due to worsening mentation EMS was notified and upon arrival the pt was unresponsive with a respiratory rate of 10 breaths per min,  O2 sats 95%, and CBG reading in the 200's.  She was placed on NRB, nasal trumpet inserted in right nare, and 1.5 mg narcan administered by EMS en route to ER with minimal improvement in mentation.  There was also concern of possible medication error at the nursing facility during administration of pts medications. In the ER she was hypothermic rectal temp 95.2 F, CXR revealed lung base atelectasis, CT Head negative, and UA positive for UTI.  Pt ruled in for sepsis she received iv fluids and abx, then subsequently admitted to the stepdown unit by hospitalist team for further workup and treatment.    PAST MEDICAL HISTORY :   has a past medical history of  Adenomatous colon polyp (08/27/2014), Anemia, Anxiety, Aortic dissection ([place]), Arthritis, Asthma, Bipolar affective disorder ([place]), Blood transfusion (2000), Blood transfusion without reported diagnosis, Cancer ([place]), Chronic abdominal pain, Cirrhosis ([place]), Colon polyp, Depression, Diabetes mellitus without complication ([place]), Fatty liver (04/09/08), GERD (gastroesophageal reflux disease), H/O: CVA (cardiovascular accident), H/O: rheumatic fever, Hepatitis, Hyperlipidemia, Incontinence, Insomnia, Lower extremity edema, Obstructive sleep apnea, Platelets decreased ([place]), RLS (restless legs syndrome), Shortness of breath, Sleep apnea, Stroke ([place]), Ulcer, and Unspecified disorders of nervous system.  has a past surgical history that includes Cholecystectomy; Rotator cuff repair; Abdominal hysterectomy; Lumbar fusion (10/09); Hemorroidectomy; Abdominal exploration surgery; Posterior cervical fusion/foraminotomy; Lumbar wound debridement (05/09/2011); Axillary artery cannulation via 8-mm Hemashield graft, median sternotomy, extracorporeal circulation with deep hypothermic circulatory arrest, repair of  aortic dessection (01/23/2009); cataract; Colonoscopy; Liver biopsy (04/10/2012); Back surgery; Eye surgery (Bilateral); and Knee arthroscopy with lateral release (Right, 11/15/2016). Prior to Admission medications   Medication Sig Start Date End Date Taking? Authorizing Provider  albuterol (PROVENTIL HFA;VENTOLIN HFA) 108 (90 Base) MCG/ACT inhaler Inhale 2 puffs into the lungs every 6 (six) hours as needed for wheezing or shortness of breath. 07/28/16   Alfred Levins, [place], MD  albuterol (PROVENTIL) (2.5 MG/3ML) 0.083% nebulizer solution Take 2.5 mg by nebulization every 6 (six) hours as needed for wheezing or shortness of breath.    [provider]  alum & mag hydroxide-simeth (ANTACID) 200-200-20 MG/5ML suspension Take 30 mLs by mouth every 4 (four) hours as needed for indigestion or heartburn.    [provider]  aspirin 325  MG tablet Take 325 mg by mouth daily.     [provider]  atorvastatin (LIPITOR) 40 MG tablet  03/07/17   [provider]  bacitracin-polymyxin b (POLYSPORIN) ointment Apply topically 3 (three) times daily. Apply to left earlobe    [provider]  barrier cream (NON-SPECIFIED) CREA Apply 1 application topically as needed (APPLIED AFTER TOILETING TO PREVENT SKIN BREAKDOWN).    [provider]  budesonide-formoterol (SYMBICORT) 160-4.5 MCG/ACT inhaler Inhale 2 puffs into the lungs 2 (two) times daily. (0800 & 2000)    [provider]  buPROPion (WELLBUTRIN SR) 150 MG 12 hr tablet Take 150 mg by mouth daily. (0800)    [provider]  clonazePAM (KLONOPIN) 0.5 MG tablet Take by mouth.    [provider]  cyclobenzaprine (FLEXERIL) 10 MG tablet Take 1 tablet (10 mg total) by mouth 3 (three) times daily as needed for muscle spasms. 06/16/17   Merlyn Lot, MD  diclofenac sodium (VOLTAREN) 1 % GEL Apply topically. 02/02/17   [provider]  dicyclomine (BENTYL) 10 MG capsule Take 10 mg by mouth 3 (three) times daily. (0800, 1300 & 2000)    [provider]  diphenhydrAMINE (DIPHENHIST) 25 mg capsule Take 25 mg by mouth every 6 (six) hours as needed (for allergic reaction (notify provider is symptoms persist more than 24 hrs*).    [provider]  Emollient (EUCERIN INTENSIVE REPAIR) LOTN Apply 1 application topically 2 (two) times daily.    [provider]  ferrous sulfate 325 (65 FE) MG tablet Take 325 mg by mouth 2 (two) times daily. (0800 & 2000)    [provider]  furosemide (LASIX) 80 MG tablet Take 80 mg by mouth daily. (0800)    [provider]  gabapentin (NEURONTIN) 300 MG capsule Take 900 mg by mouth at bedtime.    [provider]  gabapentin (NEURONTIN) 600 MG tablet Take 600 mg by mouth daily. Take 600 mg by mouth in the morning and 900  mg by mouth at bedtime.    [provider]  guaifenesin (ROBITUSSIN) 100 MG/5ML syrup Take 300 mg by mouth every 6 (six) hours as needed for cough.     [provider]  insulin glargine (LANTUS) 100 UNIT/ML injection Inject 28 Units into the skin at bedtime.    [provider]  insulin lispro (HUMALOG KWIKPEN) 100 UNIT/ML KiwkPen Inject 0-12 Units into the skin 3 (three) times daily. Check FSBS before each meal & cover with SSI (sliding scale insulin)     [provider]  lactulose (CHRONULAC) 10 GM/15ML solution Take 10 g by mouth every Monday, Wednesday, and Friday.  10/17/16   [provider]  lamoTRIgine (LAMICTAL) 100 MG tablet Take 100 mg by mouth 2 (two) times daily. (0800 & 2000)    [provider]  lidocaine (LIDODERM) 5 % Place 3 patches onto the skin daily. Apply to back 02/01/17   [provider]  lidocaine (LIDODERM) 5 % Place 1 patch onto the skin every 12 (twelve) hours. Remove & Discard patch within 12 hours or as directed by MD 06/16/17 06/16/18  Merlyn Lot, MD  loperamide (IMODIUM) 2 MG capsule Take 4 mg by mouth as needed for diarrhea or loose stools. *Do not exceed 8 doses in 24 hours*    [provider]  LYRICA 150 MG capsule Take 150 mg by mouth 3 (three) times daily. 11/29/16   [provider]  magnesium hydroxide (MILK OF MAGNESIA)  400 MG/5ML suspension Take 30 mLs by mouth daily as needed (for constipation).    [provider]  Menthol-Zinc Oxide (GOLD BOND EX) Apply 1 application topically daily.    [provider]  metoCLOPramide (REGLAN) 5 MG tablet Take 5 mg by mouth 4 (four) times daily -  before meals and at bedtime. (0800, 1200, 1600 & 2000)    [provider]  mirtazapine (REMERON) 45 MG tablet Take 45 mg by mouth at bedtime. (2000)     [provider]  omeprazole (PRILOSEC) 20 MG capsule Take by mouth. 04/16/14   [provider]  ondansetron  (ZOFRAN) 4 MG tablet Take 4 mg by mouth every 4 (four) hours as needed for nausea or vomiting.    [provider]  oxybutynin (DITROPAN-XL) 5 MG 24 hr tablet Take 5 mg by mouth daily. (0800)    [provider]  oxyCODONE (ROXICODONE) 5 MG immediate release tablet Take 1 tablet (5 mg total) by mouth every 4 (four) hours as needed for severe pain. Patient not taking: Reported on 07/04/2017 03/11/17   Henreitta Leber, MD  potassium chloride SA (K-DUR,KLOR-CON) 20 MEQ tablet Take 20 mEq by mouth daily. (0800)    [provider]  pyrithione zinc (HEAD AND SHOULDERS) 1 % shampoo Apply 1 application topically daily. t-gel or head and shoulders.    [provider]  rOPINIRole (REQUIP) 3 MG tablet Take 3 mg by mouth at bedtime. (2000)    [provider]  simvastatin (ZOCOR) 10 MG tablet Take 10 mg by mouth at bedtime. (2000)    [provider]  Skin Protectants, Misc. (EUCERIN) cream Apply topically 2 (two) times daily.    [provider]  spironolactone (ALDACTONE) 25 MG tablet Take 25 mg by mouth daily. (0800)    [provider]  traMADol (ULTRAM) 50 MG tablet Take 50 mg by mouth 4 (four) times daily.    [provider]  vitamin C (ASCORBIC ACID) 500 MG tablet Take 500 mg by mouth 2 (two) times daily. (0800 & 2000)    [provider]  Xylitol (XYLIMELTS) 500 MG DISK Use as directed 1 capsule in the mouth or throat 3 (three) times daily.    [provider]  zolpidem (AMBIEN) 5 MG tablet Take 5 mg by mouth at bedtime. (2000)    [provider]   Allergies  Allergen Reactions  . Doxycycline Hives, Swelling, Other (See Comments) and Nausea Only    Reaction:  Facial swelling  Face red and swollen; no difficulty breathing    FAMILY HISTORY:  family history includes Breast cancer in her mother; Diabetes in her unknown relative; Lung cancer in her mother; Stomach cancer in her father. SOCIAL HISTORY:   reports that she quit smoking about 4 years ago. Her smoking use included cigarettes. She has a 75.00 pack-year smoking history. she has never used smokeless tobacco. She reports that she does not drink alcohol or use drugs.  REVIEW OF SYSTEMS:   Unable to assess pt lethargic   SUBJECTIVE:  Pt lethargic, nasal trumpet removed pt protecting airway   VITAL SIGNS: Temp:  [95.2 F (35.1 C)-96.3 F (35.7 C)] 96.3 F (35.7 C) (02/14 0149) Pulse Rate:  [78-86] 82 (02/14 0149) Resp:  [9-17] 13 (02/14 0149) BP: (96-136)/(57-92) 96/57 (02/14 0149) SpO2:  [95 %-99 %] 96 % (02/14 0149) Weight:  [102.1 kg (225 lb)] 102.1 kg (225 lb) (02/13 2255)  PHYSICAL EXAMINATION: General: acutely ill appearing female,  NAD  Neuro: lethargic, oriented follows simple commands, PERRL  HEENT: supple, no JVD  Cardiovascular: sinus rhythm, s1s2, no M/R/G Lungs: shallow respirations, diminished, even, non labored  Abdomen: obese, +BS x4, soft, non tender, non distended  Musculoskeletal: 2+ bilateral pitting lower extremity edema  Skin: intact no rashes or lesions   Recent Labs  Lab 07/19/17 2242  NA 142  K 3.5  CL 106  CO2 27  BUN 14  CREATININE 0.97  GLUCOSE 170*   Recent Labs  Lab 07/19/17 2242  HGB 12.8  HCT 38.0  WBC 7.8  PLT 124*   Dg Pelvis 1-2 Views  Result Date: 07/19/2017 CLINICAL DATA:  67 year old female with altered mental status, unresponsive. EXAM: PELVIS - 1-2 VIEW COMPARISON:  CT Abdomen and Pelvis 04/23/2017 FINDINGS: Femoral heads are normally located. Hip joint spaces remain normal. Proximal femurs appear intact. The pelvis appears stable and intact. Negative SI joints. Chronic postoperative changes to the lower lumbar spine including L3-L4 transpedicular hardware. Negative visible bowel gas pattern. IMPRESSION: No acute findings. Electronically Signed   By: Genevie Ann M.D.   On: 07/19/2017 23:48   Ct Head Wo Contrast  Result Date: 07/19/2017 CLINICAL DATA:  67 year old female  with altered mental status, unresponsive. EXAM: CT HEAD WITHOUT CONTRAST TECHNIQUE: Contiguous axial images were obtained from the base of the skull through the vertex without intravenous contrast. COMPARISON:  Head CT 04/23/2017 and earlier. FINDINGS: Brain: Cerebral volume is stable and normal for age. No midline shift, ventriculomegaly, mass effect, evidence of mass lesion, intracranial hemorrhage or evidence of cortically based acute infarction. Gray-white matter differentiation is within normal limits throughout the brain. No cortical encephalomalacia identified. Vascular: Calcified atherosclerosis at the skull base. No suspicious intracranial vascular hyperdensity. Skull: Negative. Sinuses/Orbits: There is a right nasopharyngeal airway in place. Small volume retained secretions in the right nasal cavity and nasopharynx. Visualized paranasal sinuses and mastoids are stable and well pneumatized. Other: No acute orbit or scalp soft tissue findings. IMPRESSION: 1. Stable and normal for age non contrast CT appearance of the brain. 2. Right nasopharyngeal airway in place. Electronically Signed   By: Genevie Ann M.D.   On: 07/19/2017 23:28   Dg Chest Port 1 View  Result Date: 07/19/2017 CLINICAL DATA:  67 year old female with altered mental status, unresponsive. EXAM: PORTABLE CHEST 1 VIEW COMPARISON:  Chest CT 07/07/2017, chest radiographs 06/16/2017, and earlier. FINDINGS: Portable AP upright view at 2320 hrs. Large body habitus. Mildly lower lung volumes. Mildly increased hypo ventilation at both lung bases, but no pneumothorax or pleural effusion. No definite pulmonary edema. Stable cardiac size and mediastinal contours. Stable lower thoracic spinal stimulator device and prior cervical ACDF. Paucity of bowel gas in the upper abdomen. IMPRESSION: Large body habitus with lower lung volumes and mildly increased lung base atelectasis compared to radiographs in January. Electronically Signed   By: Genevie Ann M.D.   On:  07/19/2017 23:46    ASSESSMENT / PLAN: Acute encephalopathy possibly medication induced  Sepsis secondary to UTI  Hypothermia  Hx: Cirrhosis,  P: Supplemental O2 for hypoxia and/or dyspnea  Prn CXR and bronchodilator therapy  Avoid sedating medications  Trend ammonia levels Continue lactulose Urine drug screen results pending  Continuous telemetry monitoring  Maintain map >65 Trend WBC and monitor fever curve  Trend PCT  Follow cultures  Continue current abx NS @75  ml/hr  Trend BMP  Replace electrolytes as indicated  Monitor UOP  Lovenox for VTE prophylaxis  Trend CBC  Monitor for  s/sx of bleeding and transfuse for hgb <7 Keep NPO for now until mentation improves  Prn warming blanket for normothermia  CBG's q4hrs   Marda Stalker, [place] Pager [phone] (please enter 7 digits) PCCM Consult Pager [phone] (please enter 7 digits)

## 2017-07-20 NOTE — H&P (Signed)
[facility] [place] NAME: Stephanie [name]    MR#:  [id number]  DATE OF BIRTH:  1951-05-06  DATE OF ADMISSION:  07/19/2017  PRIMARY CARE PHYSICIAN: Sarajane Jews, MD   REQUESTING/REFERRING PHYSICIAN:   CHIEF COMPLAINT:   Chief Complaint  Patient presents with  . Altered Mental Status    HISTORY OF PRESENT ILLNESS: Stephanie [name]  is a 67 y.o. female with a known history of cirrhosis of liver, arthritis, bronchial asthma, anxiety disorder, colonic polyps, diabetes mellitus, GERD, hepatitis B was referred from [facility] facility for lethargy and change in mental status.  Patient was at the facility with decreased responsiveness she was referred to the emergency room.  Blood glucose was 200 in the field.  1.5 mg Narcan was also given with minimal response.  Patient was evaluated in the emergency room her core body temperature was 95 F.  Patient was put on a warming blanket.  Her urine analysis showed infection she was given IV antibiotics and IV fluids were started.  Blood pressure was on the lower side.  Bedside lactic acid was 1.2.  Ammonia level was elevated.  Hospitalist service was consulted for further care.  Not much history could be obtained from the patient as she has decreased responsiveness.  Only responds to deep sternal rub.   PAST MEDICAL HISTORY:   Past Medical History:  Diagnosis Date  . Adenomatous colon polyp 08/27/2014   Polyps x 3  . Anemia   . Anxiety   . 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 Arthritis   . Asthma   . Bipolar affective disorder ([place])    h/o  . Blood transfusion 2000  . Blood transfusion without reported diagnosis   . Cancer ([place])   . Chronic abdominal pain    abdominal wall pain  . 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 Colon polyp   . Depression   . Diabetes mellitus without complication ([place])   . Fatty liver 04/09/08   found in abd CT  . GERD (gastroesophageal reflux disease)   . H/O: CVA  (cardiovascular accident)    TIA  . H/O: rheumatic fever   . Hepatitis    HEP "B" twenty years ago  . Hyperlipidemia   . Incontinence   . Insomnia   . Lower extremity edema   . Obstructive sleep apnea    Use C-PAP twice per week  . Platelets decreased ([place])   . RLS (restless legs syndrome)   . Shortness of breath   . Sleep apnea   . Stroke [facility])    TIA- 2002  . Ulcer   . Unspecified disorders of nervous system     PAST SURGICAL HISTORY:  Past Surgical History:  Procedure Laterality Date  . ABDOMINAL EXPLORATION SURGERY    . ABDOMINAL HYSTERECTOMY     total  . Axillary artery cannulation via 8-mm Hemashield graft, median sternotomy, extracorporeal circulation with deep hypothermic circulatory arrest, repair of  aortic dessection  01/23/2009   Dr Arlyce Dice  . BACK SURGERY      x 5  . cataract     both eyes  . CHOLECYSTECTOMY    . COLONOSCOPY    . EYE SURGERY Bilateral    Cataract Extraction with IOL  . HEMORROIDECTOMY     and colon polyp removed  . KNEE ARTHROSCOPY WITH LATERAL RELEASE Right 11/15/2016   Procedure: KNEE ARTHROSCOPY WITH LATERAL RELEASE;  Surgeon: Roland Rack,  Marshall Cork, MD;  Location: [facility];  Service: Orthopedics;  Laterality: Right;  . LIVER BIOPSY  04/10/2012   Procedure: LIVER BIOPSY;  Surgeon: Inda Castle, MD;  Location: [facility];  Service: Endoscopy;  Laterality: N/A;  ultrasound to mark order for abd limited/liver to be marked to be sent by [email or web address]   . LUMBAR FUSION  10/09  . LUMBAR WOUND DEBRIDEMENT  05/09/2011   Procedure: LUMBAR WOUND DEBRIDEMENT;  Surgeon: Dahlia Bailiff;  Location: [facility];  Service: Orthopedics;  Laterality: N/A;  IRRIGATION AND DEBRIDEMENT SPINAL WOUND  . POSTERIOR CERVICAL FUSION/FORAMINOTOMY    . ROTATOR CUFF REPAIR     left    SOCIAL HISTORY:  Social History   Tobacco Use  . Smoking status: Former Smoker    Packs/day: 1.50    Years: 50.00    Pack years: 75.00    Types: Cigarettes    Last attempt to quit:  04/06/2013    Years since quitting: 4.2  . Smokeless tobacco: Never Used  Substance Use Topics  . Alcohol use: No    Alcohol/week: 0.0 oz    FAMILY HISTORY:  Family History  Problem Relation Age of Onset  . Breast cancer Mother   . Lung cancer Mother   . Stomach cancer Father   . Diabetes Unknown        4 aunts, and 1 uncle  . Colon cancer Neg Hx   . Esophageal cancer Neg Hx   . Rectal cancer Neg Hx     DRUG ALLERGIES:  Allergies  Allergen Reactions  . Doxycycline Hives, Swelling, Other (See Comments) and Nausea Only    Reaction:  Facial swelling  Face red and swollen; no difficulty breathing    REVIEW OF SYSTEMS:  Could not be obtained as patient is altered  MEDICATIONS AT HOME:  Prior to Admission medications   Medication Sig Start Date End Date Taking? Authorizing Provider  albuterol (PROVENTIL HFA;VENTOLIN HFA) 108 (90 Base) MCG/ACT inhaler Inhale 2 puffs into the lungs every 6 (six) hours as needed for wheezing or shortness of breath. 07/28/16   Alfred Levins, [place], MD  albuterol (PROVENTIL) (2.5 MG/3ML) 0.083% nebulizer solution Take 2.5 mg by nebulization every 6 (six) hours as needed for wheezing or shortness of breath.    [provider]  alum & mag hydroxide-simeth (ANTACID) 200-200-20 MG/5ML suspension Take 30 mLs by mouth every 4 (four) hours as needed for indigestion or heartburn.    [provider]  aspirin 325 MG tablet Take 325 mg by mouth daily.     [provider]  atorvastatin (LIPITOR) 40 MG tablet  03/07/17   [provider]  bacitracin-polymyxin b (POLYSPORIN) ointment Apply topically 3 (three) times daily. Apply to left earlobe    [provider]  barrier cream (NON-SPECIFIED) CREA Apply 1 application topically as needed (APPLIED AFTER TOILETING TO PREVENT SKIN BREAKDOWN).    [provider]  budesonide-formoterol (SYMBICORT) 160-4.5 MCG/ACT inhaler Inhale 2 puffs into the lungs 2 (two) times daily.  (0800 & 2000)    [provider]  buPROPion (WELLBUTRIN SR) 150 MG 12 hr tablet Take 150 mg by mouth daily. (0800)    [provider]  clonazePAM (KLONOPIN) 0.5 MG tablet Take by mouth.    [provider]  cyclobenzaprine (FLEXERIL) 10 MG tablet Take 1 tablet (10 mg total) by mouth 3 (three) times daily as needed for muscle spasms. 06/16/17   Merlyn Lot, MD  diclofenac sodium (VOLTAREN) 1 % GEL Apply  topically. 02/02/17   [provider]  dicyclomine (BENTYL) 10 MG capsule Take 10 mg by mouth 3 (three) times daily. (0800, 1300 & 2000)    [provider]  diphenhydrAMINE (DIPHENHIST) 25 mg capsule Take 25 mg by mouth every 6 (six) hours as needed (for allergic reaction (notify provider is symptoms persist more than 24 hrs*).    [provider]  Emollient (EUCERIN INTENSIVE REPAIR) LOTN Apply 1 application topically 2 (two) times daily.    [provider]  ferrous sulfate 325 (65 FE) MG tablet Take 325 mg by mouth 2 (two) times daily. (0800 & 2000)    [provider]  furosemide (LASIX) 80 MG tablet Take 80 mg by mouth daily. (0800)    [provider]  gabapentin (NEURONTIN) 300 MG capsule Take 900 mg by mouth at bedtime.    [provider]  gabapentin (NEURONTIN) 600 MG tablet Take 600 mg by mouth daily. Take 600 mg by mouth in the morning and 900 mg by mouth at bedtime.    [provider]  guaifenesin (ROBITUSSIN) 100 MG/5ML syrup Take 300 mg by mouth every 6 (six) hours as needed for cough.     [provider]  insulin glargine (LANTUS) 100 UNIT/ML injection Inject 28 Units into the skin at bedtime.    [provider]  insulin lispro (HUMALOG KWIKPEN) 100 UNIT/ML KiwkPen Inject 0-12 Units into the skin 3 (three) times daily. Check FSBS before each meal & cover with SSI (sliding scale insulin)     [provider]  lactulose (CHRONULAC) 10 GM/15ML solution Take 10 g by  mouth every Monday, Wednesday, and Friday.  10/17/16   [provider]  lamoTRIgine (LAMICTAL) 100 MG tablet Take 100 mg by mouth 2 (two) times daily. (0800 & 2000)    [provider]  lidocaine (LIDODERM) 5 % Place 3 patches onto the skin daily. Apply to back 02/01/17   [provider]  lidocaine (LIDODERM) 5 % Place 1 patch onto the skin every 12 (twelve) hours. Remove & Discard patch within 12 hours or as directed by MD 06/16/17 06/16/18  Merlyn Lot, MD  loperamide (IMODIUM) 2 MG capsule Take 4 mg by mouth as needed for diarrhea or loose stools. *Do not exceed 8 doses in 24 hours*    [provider]  LYRICA 150 MG capsule Take 150 mg by mouth 3 (three) times daily. 11/29/16   [provider]  magnesium hydroxide (MILK OF MAGNESIA) 400 MG/5ML suspension Take 30 mLs by mouth daily as needed (for constipation).    [provider]  Menthol-Zinc Oxide (GOLD BOND EX) Apply 1 application topically daily.    [provider]  metoCLOPramide (REGLAN) 5 MG tablet Take 5 mg by mouth 4 (four) times daily -  before meals and at bedtime. (0800, 1200, 1600 & 2000)    [provider]  mirtazapine (REMERON) 45 MG tablet Take 45 mg by mouth at bedtime. (2000)     [provider]  omeprazole (PRILOSEC) 20 MG capsule Take by mouth. 04/16/14   [provider]  ondansetron (ZOFRAN) 4 MG tablet Take 4 mg by mouth every 4 (four) hours as needed for nausea or vomiting.    [provider]  oxybutynin (DITROPAN-XL) 5 MG 24 hr tablet Take 5 mg by mouth daily. (0800)    [provider]  oxyCODONE (ROXICODONE) 5 MG immediate release tablet Take 1 tablet (5 mg total) by mouth every 4 (four)  hours as needed for severe pain. Patient not taking: Reported on 07/04/2017 03/11/17   Henreitta Leber, MD  potassium chloride SA (K-DUR,KLOR-CON) 20 MEQ tablet Take 20 mEq by mouth daily. (0800)    [provider]  pyrithione  zinc (HEAD AND SHOULDERS) 1 % shampoo Apply 1 application topically daily. t-gel or head and shoulders.    [provider]  rOPINIRole (REQUIP) 3 MG tablet Take 3 mg by mouth at bedtime. (2000)    [provider]  simvastatin (ZOCOR) 10 MG tablet Take 10 mg by mouth at bedtime. (2000)    [provider]  Skin Protectants, Misc. (EUCERIN) cream Apply topically 2 (two) times daily.    [provider]  spironolactone (ALDACTONE) 25 MG tablet Take 25 mg by mouth daily. (0800)    [provider]  traMADol (ULTRAM) 50 MG tablet Take 50 mg by mouth 4 (four) times daily.    [provider]  vitamin C (ASCORBIC ACID) 500 MG tablet Take 500 mg by mouth 2 (two) times daily. (0800 & 2000)    [provider]  Xylitol (XYLIMELTS) 500 MG DISK Use as directed 1 capsule in the mouth or throat 3 (three) times daily.    [provider]  zolpidem (AMBIEN) 5 MG tablet Take 5 mg by mouth at bedtime. (2000)    [provider]      PHYSICAL EXAMINATION:   VITAL SIGNS: Blood pressure (!) 96/57, pulse 82, temperature (!) 96.3 F (35.7 C), temperature source Rectal, resp. rate 13, height 5' 3"  (1.6 m), weight 102.1 kg (225 lb), SpO2 96 %.  GENERAL:  67 y.o.-year-old obese patient lying in the bed on warming blanket EYES: Pupils equal, round, reactive to light and accommodation. No scleral icterus. Extraocular muscles intact.  HEENT: Head atraumatic, normocephalic. Oropharynx dry and nasopharynx clear.  NECK:  Supple, no jugular venous distention. No thyroid enlargement, no tenderness.  LUNGS: Normal breath sounds bilaterally, no wheezing, rales,rhonchi or crepitation. No use of accessory muscles of respiration.  CARDIOVASCULAR: S1, S2 normal. No murmurs, rubs, or gallops.  ABDOMEN: Soft, nontender, nondistended. Bowel sounds present. No organomegaly or mass.  EXTREMITIES: No pedal edema, cyanosis, or clubbing.  NEUROLOGIC: Not oriented to  time , place and person Responds to sternal rubs and painful stimuli . Gait not checked.  PSYCHIATRIC: could not be assessed SKIN: No obvious rash, lesion, or ulcer.   LABORATORY PANEL:   CBC Recent Labs  Lab 07/19/17 2242  WBC 7.8  HGB 12.8  HCT 38.0  PLT 124*  MCV 93.8  MCH 31.5  MCHC 33.5  RDW 16.1*  LYMPHSABS 1.2  MONOABS 0.8  EOSABS 0.1  BASOSABS 0.0   ------------------------------------------------------------------------------------------------------------------  Chemistries  Recent Labs  Lab 07/19/17 2242  NA 142  K 3.5  CL 106  CO2 27  GLUCOSE 170*  BUN 14  CREATININE 0.97  CALCIUM 8.8*  AST 25  ALT 19  ALKPHOS 143*  BILITOT 0.7   ------------------------------------------------------------------------------------------------------------------ estimated creatinine clearance is 65.1 mL/min (by C-G formula based on SCr of 0.97 mg/dL). ------------------------------------------------------------------------------------------------------------------ Recent Labs    07/19/17 2242  TSH 4.486     Coagulation profile No results for input(s): INR, PROTIME in the last 168 hours. ------------------------------------------------------------------------------------------------------------------- No results for input(s): DDIMER in the last 72 hours. -------------------------------------------------------------------------------------------------------------------  Cardiac Enzymes Recent Labs  Lab 07/19/17 2242  TROPONINI <0.03   ------------------------------------------------------------------------------------------------------------------ Invalid input(s): POCBNP  ---------------------------------------------------------------------------------------------------------------  Urinalysis    Component Value Date/Time   COLORURINE YELLOW (  A) 07/19/2017 2242   APPEARANCEUR CLOUDY (A) 07/19/2017 2242   APPEARANCEUR Cloudy 12/26/2013 0932   LABSPEC  1.015 07/19/2017 2242   LABSPEC 1.029 12/26/2013 0932   PHURINE 6.0 07/19/2017 2242   GLUCOSEU NEGATIVE 07/19/2017 2242   GLUCOSEU Negative 12/26/2013 0932   HGBUR NEGATIVE 07/19/2017 2242   HGBUR negative 05/25/2010 0804   BILIRUBINUR NEGATIVE 07/19/2017 2242   BILIRUBINUR Negative 12/26/2013 0932   KETONESUR NEGATIVE 07/19/2017 2242   PROTEINUR NEGATIVE 07/19/2017 2242   UROBILINOGEN 0.2 07/29/2013 1235   UROBILINOGEN 0.2 05/25/2010 0804   NITRITE NEGATIVE 07/19/2017 2242   LEUKOCYTESUR MODERATE (A) 07/19/2017 2242   LEUKOCYTESUR Trace 12/26/2013 0932     RADIOLOGY: Dg Pelvis 1-2 Views  Result Date: 07/19/2017 CLINICAL DATA:  67 year old female with altered mental status, unresponsive. EXAM: PELVIS - 1-2 VIEW COMPARISON:  CT Abdomen and Pelvis 04/23/2017 FINDINGS: Femoral heads are normally located. Hip joint spaces remain normal. Proximal femurs appear intact. The pelvis appears stable and intact. Negative SI joints. Chronic postoperative changes to the lower lumbar spine including L3-L4 transpedicular hardware. Negative visible bowel gas pattern. IMPRESSION: No acute findings. Electronically Signed   By: Genevie Ann M.D.   On: 07/19/2017 23:48   Ct Head Wo Contrast  Result Date: 07/19/2017 CLINICAL DATA:  67 year old female with altered mental status, unresponsive. EXAM: CT HEAD WITHOUT CONTRAST TECHNIQUE: Contiguous axial images were obtained from the base of the skull through the vertex without intravenous contrast. COMPARISON:  Head CT 04/23/2017 and earlier. FINDINGS: Brain: Cerebral volume is stable and normal for age. No midline shift, ventriculomegaly, mass effect, evidence of mass lesion, intracranial hemorrhage or evidence of cortically based acute infarction. Gray-white matter differentiation is within normal limits throughout the brain. No cortical encephalomalacia identified. Vascular: Calcified atherosclerosis at the skull base. No suspicious intracranial vascular hyperdensity.  Skull: Negative. Sinuses/Orbits: There is a right nasopharyngeal airway in place. Small volume retained secretions in the right nasal cavity and nasopharynx. Visualized paranasal sinuses and mastoids are stable and well pneumatized. Other: No acute orbit or scalp soft tissue findings. IMPRESSION: 1. Stable and normal for age non contrast CT appearance of the brain. 2. Right nasopharyngeal airway in place. Electronically Signed   By: Genevie Ann M.D.   On: 07/19/2017 23:28   Dg Chest Port 1 View  Result Date: 07/19/2017 CLINICAL DATA:  67 year old female with altered mental status, unresponsive. EXAM: PORTABLE CHEST 1 VIEW COMPARISON:  Chest CT 07/07/2017, chest radiographs 06/16/2017, and earlier. FINDINGS: Portable AP upright view at 2320 hrs. Large body habitus. Mildly lower lung volumes. Mildly increased hypo ventilation at both lung bases, but no pneumothorax or pleural effusion. No definite pulmonary edema. Stable cardiac size and mediastinal contours. Stable lower thoracic spinal stimulator device and prior cervical ACDF. Paucity of bowel gas in the upper abdomen. IMPRESSION: Large body habitus with lower lung volumes and mildly increased lung base atelectasis compared to radiographs in January. Electronically Signed   By: Genevie Ann M.D.   On: 07/19/2017 23:46    EKG: Orders placed or performed during the hospital encounter of 07/19/17  . EKG 12-Lead  . EKG 12-Lead  . ED EKG 12-Lead  . ED EKG 12-Lead   *Note: Due to a large number of results and/or encounters for the requested time period, some results have not been displayed. A complete set of results can be found in Results Review.    IMPRESSION AND PLAN: 67 year old female patient who is a resident of [facility] facility with history of  diabetes mellitus, bipolar disorder, bronchial asthma, cirrhosis of liver presented to the emergency room with lethargy and confusion.  Admitting diagnosis : 1.  Altered mental status 2.  Hypothermia 3   Sepsis. 4.  Hepatic encephalopathy 5.  Urinary tract infection  Treatment plan : Admit patient to stepdown unit IV fluid hydration Warming blanket Start patient on IV vancomycin and Zosyn antibiotic Follow-up cultures Oral lactulose Follow-up ammonia level Intensivist consultation   All the records are reviewed and case discussed with ED provider. Management plans discussed with the patient, family and they are in agreement.  CODE STATUS:FULL CODE Code Status History    Date Active Date Inactive Code Status Order ID Comments User Context   03/08/2017 01:55 03/11/2017 21:04 Full Code [id number]  Demetrios Loll, MD Inpatient   12/21/2016 11:56 12/22/2016 19:18 Full Code [id number]  Demetrios Loll, MD Inpatient   11/15/2016 16:10 11/15/2016 20:11 Full Code [id number]  Corky Mull, MD Inpatient   07/19/2016 14:54 07/21/2016 17:09 Full Code [id number]  Nicholes Mango, MD Inpatient   04/18/2016 13:11 04/21/2016 14:38 Full Code [id number]  Gladstone Lighter, MD ED   04/04/2016 15:31 04/05/2016 21:38 Full Code [id number]  Loletha Grayer, MD ED   09/10/2015 14:04 09/11/2015 18:33 Full Code [id number]  Yves Dill, RN Inpatient   09/09/2015 23:14 09/10/2015 14:04 DNR [id number]  Demetrios Loll, MD ED       TOTAL CRITICAL CARE TIME TAKING CARE OF THIS PATIENT: 55 minutes.    Saundra Shelling M.D on 07/20/2017 at 1:56 AM  Between 7am to 6pm - Pager - [phone]  After 6pm go to [email or web address] - password [email or web address] [facility]  Office  [phone]  CC: Primary care physician; Sarajane Jews, MD

## 2017-07-20 NOTE — Progress Notes (Signed)
Pharmacy Antibiotic Note  MARLEY PAKULA is a 67 y.o. female admitted on 07/19/2017 with sepsis.  Pharmacy has been consulted for vanc/zosyn dosing.  Plan: Patient received vanc 1g and zosyn 3.375g IV x 1  Will f/u w/ vanc 1g IV q12h w/ 6 hour stack dose. Will draw vanc trough 02/15 @ 2000 prior to 4th Will continue zosyn 3.375g IV q8h  Ke 0.0660  T1/2 12 hrs Goal trough 15 - 20 mcg/mL  Height: 5' 3"  (160 cm) Weight: 225 lb (102.1 kg) IBW/kg (Calculated) : 52.4  Temp (24hrs), Avg:95.8 F (35.4 C), Min:95.2 F (35.1 C), Max:96.3 F (35.7 C)  Recent Labs  Lab 07/19/17 2242 07/20/17 0343  WBC 7.8 7.3  CREATININE 0.97 0.85  LATICACIDVEN 1.2 1.1    Estimated Creatinine Clearance: 74.3 mL/min (by C-G formula based on SCr of 0.85 mg/dL).    Allergies  Allergen Reactions  . Doxycycline Hives, Swelling, Other (See Comments) and Nausea Only    Reaction:  Facial swelling  Face red and swollen; no difficulty breathing    Thank you for allowing pharmacy to be a part of this patient's care.  Tobie Lords, PharmD, BCPS Clinical Pharmacist 07/20/2017

## 2017-07-20 NOTE — ED Notes (Signed)
Suction catheter put in placed on pt

## 2017-07-20 NOTE — ED Notes (Signed)
Pt starting to shift head slightly in bed on her own. Able to mumble her name, pt was able to answer yes when asked in she was in pain, when asked where she was hurting pt stated her chest. Unable to answer any other questions at this time. Pt still only seems to respond to sternal rub.

## 2017-07-20 NOTE — Progress Notes (Signed)
Pharmacy Antibiotic Note  Stephanie [name] is a 67 y.o. female admitted on 07/19/2017 with UTI.  Pharmacy has been consulted for ceftiraxone dosing.  Plan: Patient received ceftriaxone 1g IV x 1  Will give another 1g for a total of 2g. Will start ceftriaxone 2g IV daily @ 1000  Height: 5' 3"  (160 cm) Weight: 225 lb (102.1 kg) IBW/kg (Calculated) : 52.4  Temp (24hrs), Avg:95.2 F (35.1 C), Min:95.2 F (35.1 C), Max:95.2 F (35.1 C)  Recent Labs  Lab 07/19/17 2242  WBC 7.8  CREATININE 0.97  LATICACIDVEN 1.2    Estimated Creatinine Clearance: 65.1 mL/min (by C-G formula based on SCr of 0.97 mg/dL).    Allergies  Allergen Reactions  . Doxycycline Hives, Swelling, Other (See Comments) and Nausea Only    Reaction:  Facial swelling  Face red and swollen; no difficulty breathing    Thank you for allowing pharmacy to be a part of this patient's care.  Tobie Lords, PharmD, BCPS Clinical Pharmacist 07/20/2017

## 2017-07-20 NOTE — Progress Notes (Signed)
Writer alerted Dr Celesta Aver pt less responsive than this AM, she will rouse but won't open eyes or follow commands.  CBG 73.  Push 1 amp Dextrose and change fluids to D5NS @ 75, order cortisol level for AM.  BP soft but MAPS >65.  Ammonia level was WDL on admit, 34.

## 2017-07-21 DIAGNOSIS — F419 Anxiety disorder, unspecified: Secondary | ICD-10-CM

## 2017-07-21 LAB — BLOOD CULTURE ID PANEL (REFLEXED)
ACINETOBACTER BAUMANNII: NOT DETECTED
CANDIDA GLABRATA: NOT DETECTED
CANDIDA KRUSEI: NOT DETECTED
CANDIDA PARAPSILOSIS: NOT DETECTED
Candida albicans: NOT DETECTED
Candida tropicalis: NOT DETECTED
ENTEROBACTER CLOACAE COMPLEX: NOT DETECTED
ESCHERICHIA COLI: NOT DETECTED
Enterobacteriaceae species: NOT DETECTED
Enterococcus species: NOT DETECTED
Haemophilus influenzae: NOT DETECTED
Klebsiella oxytoca: NOT DETECTED
Klebsiella pneumoniae: NOT DETECTED
LISTERIA MONOCYTOGENES: NOT DETECTED
Methicillin resistance: NOT DETECTED
Neisseria meningitidis: NOT DETECTED
PSEUDOMONAS AERUGINOSA: NOT DETECTED
Proteus species: NOT DETECTED
SERRATIA MARCESCENS: NOT DETECTED
STAPHYLOCOCCUS AUREUS BCID: NOT DETECTED
STREPTOCOCCUS AGALACTIAE: NOT DETECTED
STREPTOCOCCUS PNEUMONIAE: NOT DETECTED
STREPTOCOCCUS PYOGENES: NOT DETECTED
Staphylococcus species: DETECTED — AB
Streptococcus species: NOT DETECTED

## 2017-07-21 LAB — BASIC METABOLIC PANEL
Anion gap: 6 (ref 5–15)
BUN: 9 mg/dL (ref 6–20)
CALCIUM: 8.8 mg/dL — AB (ref 8.9–10.3)
CO2: 26 mmol/L (ref 22–32)
CREATININE: 0.67 mg/dL (ref 0.44–1.00)
Chloride: 112 mmol/L — ABNORMAL HIGH (ref 101–111)
Glucose, Bld: 152 mg/dL — ABNORMAL HIGH (ref 65–99)
Potassium: 4.1 mmol/L (ref 3.5–5.1)
SODIUM: 144 mmol/L (ref 135–145)

## 2017-07-21 LAB — URINE CULTURE

## 2017-07-21 LAB — GLUCOSE, CAPILLARY
GLUCOSE-CAPILLARY: 157 mg/dL — AB (ref 65–99)
GLUCOSE-CAPILLARY: 125 mg/dL — AB (ref 65–99)
Glucose-Capillary: 201 mg/dL — ABNORMAL HIGH (ref 65–99)
Glucose-Capillary: 222 mg/dL — ABNORMAL HIGH (ref 65–99)

## 2017-07-21 LAB — PROCALCITONIN: Procalcitonin: <0.1 ng/mL

## 2017-07-21 LAB — PHOSPHORUS: PHOSPHORUS: 3.7 mg/dL (ref 2.5–4.6)

## 2017-07-21 LAB — AMMONIA: Ammonia: 45 umol/L — ABNORMAL HIGH (ref 9–35)

## 2017-07-21 LAB — MAGNESIUM: MAGNESIUM: 2.1 mg/dL (ref 1.7–2.4)

## 2017-07-21 MED ORDER — DEXTROSE IN LACTATED RINGERS 5 % IV SOLN
INTRAVENOUS | Status: DC
  Administered 2017-07-21: 16:00:00 via INTRAVENOUS

## 2017-07-21 MED ORDER — HYDROCORTISONE NA SUCCINATE PF 100 MG IJ SOLR
75.0000 mg | Freq: Three times a day (TID) | INTRAMUSCULAR | Status: DC
  Administered 2017-07-21 – 2017-07-22 (×3): 75 mg via INTRAVENOUS
  Filled 2017-07-21 (×3): qty 2

## 2017-07-21 MED ORDER — SIMVASTATIN 10 MG PO TABS: 10.0000 mg | ORAL_TABLET | Freq: Every day | ORAL | Status: DC

## 2017-07-21 MED ORDER — BUPROPION HCL ER (XL) 150 MG PO TB24
150.0000 mg | ORAL_TABLET | Freq: Every day | ORAL | Status: DC
  Administered 2017-07-22: 150 mg via ORAL
  Filled 2017-07-21: qty 1

## 2017-07-21 MED ORDER — MOMETASONE FURO-FORMOTEROL FUM 200-5 MCG/ACT IN AERO: 2.0000 | INHALATION_SPRAY | Freq: Two times a day (BID) | RESPIRATORY_TRACT | Status: DC

## 2017-07-21 MED ORDER — CLONAZEPAM 0.5 MG PO TABS
0.5000 mg | ORAL_TABLET | Freq: Two times a day (BID) | ORAL | Status: DC | PRN
  Administered 2017-07-21: 0.5 mg via ORAL
  Filled 2017-07-21: qty 1

## 2017-07-21 NOTE — Consult Note (Signed)
[facility] Consult   Reason for Consult: Consult for 67 year old woman in the hospital with altered mental status now recovering. Referring Physician:  [place] Patient Identification: Stephanie [name] MRN:  [id number] Principal Diagnosis: Anxiety Diagnosis:   Patient Active Problem List   Diagnosis Date Noted  . Sepsis ([place]) [A41.9] 07/20/2017  . Elevated alkaline phosphatase level [R74.8] 04/16/2017  . Altered mental status [R41.82] 03/07/2017  . Degenerative tear of lateral meniscus of right knee [M23.300] 11/15/2016  . Encephalopathy [G93.40] 07/19/2016  . Degenerative tear of medial meniscus of right knee [M23.203] 07/01/2016  . Primary osteoarthritis of right knee [M17.11] 07/01/2016  . Diabetes mellitus type 1, uncontrolled, without complications ([place]) [R67.89] 06/20/2016  . Lymphedema [I89.0] 06/14/2016  . Pain in limb [M79.609] 05/06/2016  . Swelling of limb [M79.89] 05/06/2016  . Postphlebitic syndrome with inflammation [I87.029] 05/06/2016  . Pneumonia [J18.9] 04/18/2016  . Chest pain [R07.9] 04/04/2016  . Iron deficiency anemia [D50.9] 01/30/2016  . Bilateral carotid artery stenosis [I65.23] 01/12/2016  . Hypoglycemia [E16.2] 09/09/2015  . Hyperglycemia [R73.9] 07/16/2015  . Neuropathy [G62.9] 06/08/2015  . Thrombocytopenia ([place]) [D69.6] 08/19/2014  . Abdominal pain, chronic, epigastric [R10.13, G89.29] 06/12/2014  . Liver cirrhosis secondary to NASH ([place]) [K75.81, K74.60] 02/27/2014  . Dizziness and giddiness [R42] 10/30/2013  . DOE (dyspnea on exertion) [R06.09] 10/30/2013  . Poorly controlled type 2 diabetes mellitus with complication ([place]) [F81.0, E11.65] 10/14/2013  . DNR (do not resuscitate) [Z66] 07/29/2013  . Encounter for routine gynecological examination [Z01.419] 07/29/2013  . Dyspnea [R06.00] 07/10/2013  . Lung nodule [R91.1] 07/10/2013  . Varicose veins of left lower extremity [I83.92] 10/09/2012  . GERD (gastroesophageal reflux disease)  [K21.9] 10/09/2012  . Lower extremity edema [R60.0] 02/02/2012  . Cirrhosis of liver ([place]) [K74.60] 02/02/2012  . Anxiety [F41.9] 07/11/2011  . Aortic dissection ([place]) [I71.00]   . BACK PAIN, LUMBAR, CHRONIC [M54.5] 11/19/2009  . UNS ADVRS EFF OTH RX MEDICINAL&BIOLOGICAL SBSTNC [T50.995A] 12/16/2008  . UNSPECIFIED VITAMIN D DEFICIENCY [E55.9] 09/26/2008  . TOBACCO ABUSE [F17.200] 06/19/2008  . RESTLESS LEGS SYNDROME [G25.81] 04/27/2007  . INSOMNIA [G47.00] 04/27/2007  . HLD (hyperlipidemia) [E78.5] 03/08/2007  . OSA (obstructive sleep apnea) [G47.33] 03/08/2007  . Essential hypertension [I10] 03/08/2007  . Asthma [J45.909] 03/08/2007  . BIPOLAR AFFECTIVE DISORDER, HX OF [Z86.59] 03/08/2007    Total Time spent with patient: 45 minutes  Subjective:   Stephanie [name] is a 67 y.o. female patient admitted with "I do not remember".  HPI: Patient interviewed chart reviewed.  67 year old woman who lives at the [place] who was brought in with altered mental status.  Initially unclear etiology although appears to possibly be related to urosepsis.  Patient was described as having an odd mental status where she was not opening her eyes and the critical care unit which was the reason for calling a psychiatric consult.  I found the patient awake and alert in her room.  She repeated several times to me that she wanted to go home.  Very childlike sort of behavior although when I ask her direct questions she was able to tell me where she lived and was oriented to her situation.  Patient was able to remember 3 words immediately and at 3 minutes.  She denied feeling depressed denied suicidal ideation denied hallucinations.  She told me that she felt anxious much of the time and just wanted to go home although she could not articulate exactly why.  Made a few odd statements such as saying that  it was "all her fault" that she came to the hospital.  Social history: Lives at the [place] [place].  Not clear how much contact she  has with the family although it looks like there is documentation about her daughter and some of the old records.  Medical history: Multiple medical problems.  Very wide array of problems going back years including head injuries.  Substance abuse history: Denies any and there is no history of it  Past Psychiatric History: Patient tells me it is true that she has a history of depression and anxiety but it is very difficult for her to describe it.  She denies ever being admitted to a psychiatric hospital.  Denies any suicidality.  Cannot remember whether she had been on medicine in the past.  I have reviewed a lot of notes from the old records.  Although anxiety and depression are in her problem list the current notes in epic from [facility] do not provide any detail about it.  I found a little bit of information and some of the other records from [facility] and outpatient clinics indicating that she does have a history of anxiety and that she had been treated at one point in the past with Abilify up to 30 mg which suggests possibly a psychotic disorder.  There is no mention of any suicidality or inpatient treatment.  Risk to Self: Is patient at risk for suicide?: No Risk to Others:   Prior Inpatient Therapy:   Prior Outpatient Therapy:    Past Medical History:  Past Medical History:  Diagnosis Date  . Adenomatous colon polyp 08/27/2014   Polyps x 3  . Anemia   . Anxiety   . 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 Arthritis   . Asthma   . Bipolar affective disorder ([place])    h/o  . Blood transfusion 2000  . Blood transfusion without reported diagnosis   . Cancer ([place])   . Chronic abdominal pain    abdominal wall pain  . Cirrhosis ([place])    related to NASH  . Colon polyp   . Depression   . Diabetes mellitus without complication ([place])   . Fatty liver 04/09/08   found in abd CT  . GERD (gastroesophageal reflux disease)   . H/O: CVA (cardiovascular accident)    TIA  . H/O: rheumatic  fever   . Hepatitis    HEP "B" twenty years ago  . Hyperlipidemia   . Incontinence   . Insomnia   . Lower extremity edema   . Obstructive sleep apnea    Use C-PAP twice per week  . Platelets decreased ([place])   . RLS (restless legs syndrome)   . Shortness of breath   . Sleep apnea   . Stroke [facility])    TIA- 2002  . Ulcer   . Unspecified disorders of nervous system     Past Surgical History:  Procedure Laterality Date  . ABDOMINAL EXPLORATION SURGERY    . ABDOMINAL HYSTERECTOMY     total  . Axillary artery cannulation via 8-mm Hemashield graft, median sternotomy, extracorporeal circulation with deep hypothermic circulatory arrest, repair of  aortic dessection  01/23/2009   Dr Arlyce Dice  . BACK SURGERY      x 5  . cataract     both eyes  . CHOLECYSTECTOMY    . COLONOSCOPY    . EYE SURGERY Bilateral    Cataract Extraction with IOL  . HEMORROIDECTOMY     and colon  polyp removed  . KNEE ARTHROSCOPY WITH LATERAL RELEASE Right 11/15/2016   Procedure: KNEE ARTHROSCOPY WITH LATERAL RELEASE;  Surgeon: Corky Mull, MD;  Location: [facility];  Service: Orthopedics;  Laterality: Right;  . LIVER BIOPSY  04/10/2012   Procedure: LIVER BIOPSY;  Surgeon: Inda Castle, MD;  Location: [facility];  Service: Endoscopy;  Laterality: N/A;  ultrasound to mark order for abd limited/liver to be marked to be sent by [email or web address]   . LUMBAR FUSION  10/09  . LUMBAR WOUND DEBRIDEMENT  05/09/2011   Procedure: LUMBAR WOUND DEBRIDEMENT;  Surgeon: Dahlia Bailiff;  Location: [facility];  Service: Orthopedics;  Laterality: N/A;  IRRIGATION AND DEBRIDEMENT SPINAL WOUND  . POSTERIOR CERVICAL FUSION/FORAMINOTOMY    . ROTATOR CUFF REPAIR     left   Family History:  Family History  Problem Relation Age of Onset  . Breast cancer Mother   . Lung cancer Mother   . Stomach cancer Father   . Diabetes Unknown        4 aunts, and 1 uncle  . Colon cancer Neg Hx   . Esophageal cancer Neg Hx   . Rectal cancer Neg Hx     Family Psychiatric  History: Patient does not know of any Social History:  Social History   Substance and Sexual Activity  Alcohol Use No  . Alcohol/week: 0.0 oz     Social History   Substance and Sexual Activity  Drug Use No    Social History   Socioeconomic History  . Marital status: Divorced    Spouse name: None  . Number of children: 2  . Years of education: None  . Highest education level: None  Social Needs  . Financial resource strain: None  . Food insecurity - worry: None  . Food insecurity - inability: None  . Transportation needs - medical: None  . Transportation needs - non-medical: None  Occupational History  . Occupation: Disabled    Employer: [employer]  Tobacco Use  . Smoking status: Former Smoker    Packs/day: 1.50    Years: 50.00    Pack years: 75.00    Types: Cigarettes    Last attempt to quit: 04/06/2013    Years since quitting: 4.2  . Smokeless tobacco: Never Used  Substance and Sexual Activity  . Alcohol use: No    Alcohol/week: 0.0 oz  . Drug use: No  . Sexual activity: No  Other Topics Concern  . None  Social History Narrative   1 son, 61+ y/o   Daily caffeine use: 2 cups daily   Does not get regular exercise   Disability due to bipolar      Has a living will- she is a DNR (she has form at home per pt).   Additional Social History:    Allergies:   Allergies  Allergen Reactions  . Doxycycline Hives, Swelling, Other (See Comments) and Nausea Only    Reaction:  Facial swelling  Face red and swollen; no difficulty breathing    Labs:  Results for orders placed or performed during the hospital encounter of 07/19/17 (from the past 48 hour(s))  Comprehensive metabolic panel     Status: Abnormal   Collection Time: 07/19/17 10:42 PM  Result Value Ref Range   Sodium 142 135 - 145 mmol/L   Potassium 3.5 3.5 - 5.1 mmol/L   Chloride 106 101 - 111 mmol/L   CO2 27 22 - 32 mmol/L   Glucose, Bld 170 (H) 65 - 99  mg/dL   BUN 14 6 - 20  mg/dL   Creatinine, Ser 0.97 0.44 - 1.00 mg/dL   Calcium 8.8 (L) 8.9 - 10.3 mg/dL   Total Protein 6.5 6.5 - 8.1 g/dL   Albumin 3.2 (L) 3.5 - 5.0 g/dL   AST 25 15 - 41 U/L   ALT 19 14 - 54 U/L   Alkaline Phosphatase 143 (H) 38 - 126 U/L   Total Bilirubin 0.7 0.3 - 1.2 mg/dL   GFR calc non Af Amer 60 (L) >60 mL/min   GFR calc Af Amer >60 >60 mL/min    Comment: (NOTE) The eGFR has been calculated using the CKD EPI equation. This calculation has not been validated in all clinical situations. eGFR's persistently <60 mL/min signify possible Chronic Kidney Disease.    Anion gap 9 5 - 15    Comment: Performed at [facility], [address] [place], [place] [address]  CBC WITH DIFFERENTIAL     Status: Abnormal   Collection Time: 07/19/17 10:42 PM  Result Value Ref Range   WBC 7.8 3.6 - 11.0 K/uL   RBC 4.05 3.80 - 5.20 MIL/uL   Hemoglobin 12.8 12.0 - 16.0 g/dL   HCT 38.0 35.0 - 47.0 %   MCV 93.8 80.0 - 100.0 fL   MCH 31.5 26.0 - 34.0 pg   MCHC 33.5 32.0 - 36.0 g/dL   RDW 16.1 (H) 11.5 - 14.5 %   Platelets 124 (L) 150 - 440 K/uL   Neutrophils Relative % 74 %   Neutro Abs 5.8 1.4 - 6.5 K/uL   Lymphocytes Relative 15 %   Lymphs Abs 1.2 1.0 - 3.6 K/uL   Monocytes Relative 10 %   Monocytes Absolute 0.8 0.2 - 0.9 K/uL   Eosinophils Relative 1 %   Eosinophils Absolute 0.1 0 - 0.7 K/uL   Basophils Relative 0 %   Basophils Absolute 0.0 0 - 0.1 K/uL    Comment: Performed at [facility], [address] [place], [place] [address]  Blood Culture (routine x 2)     Status: None (Preliminary result)   Collection Time: 07/19/17 10:42 PM  Result Value Ref Range   Specimen Description BLOOD lLT AC    Special Requests      BOTTLES DRAWN AEROBIC AND ANAEROBIC Blood Culture adequate volume   Culture  Setup Time      Organism ID to follow IN BOTH AEROBIC AND ANAEROBIC BOTTLES GRAM POSITIVE COCCI CRITICAL RESULT CALLED TO, READ BACK BY AND VERIFIED WITH: DAVID BASANTI ON  07/21/17 AT 0023 [facility] Performed at [facility], [address] [place], [place] [address]    Culture GRAM POSITIVE COCCI    Report Status PENDING   Blood Culture (routine x 2)     Status: None (Preliminary result)   Collection Time: 07/19/17 10:42 PM  Result Value Ref Range   Specimen Description BLOOD RT FOREARM    Special Requests      BOTTLES DRAWN AEROBIC AND ANAEROBIC Blood Culture results may not be optimal due to an excessive volume of blood received in culture bottles   Culture      NO GROWTH 2 DAYS Performed at [facility], [address] [place], [place] [address]    Report Status PENDING   Urinalysis, Routine w reflex microscopic     Status: Abnormal   Collection Time: 07/19/17 10:42 PM  Result Value Ref Range   Color, Urine YELLOW (A) YELLOW   APPearance CLOUDY (  A) CLEAR   Specific Gravity, Urine 1.015 1.005 - 1.030   pH 6.0 5.0 - 8.0   Glucose, UA NEGATIVE NEGATIVE mg/dL   Hgb urine dipstick NEGATIVE NEGATIVE   Bilirubin Urine NEGATIVE NEGATIVE   Ketones, ur NEGATIVE NEGATIVE mg/dL   Protein, ur NEGATIVE NEGATIVE mg/dL   Nitrite NEGATIVE NEGATIVE   Leukocytes, UA MODERATE (A) NEGATIVE   RBC / HPF 0-5 0 - 5 RBC/hpf   WBC, UA TOO NUMEROUS TO COUNT 0 - 5 WBC/hpf   Bacteria, UA RARE (A) NONE SEEN   Squamous Epithelial / LPF NONE SEEN NONE SEEN   WBC Clumps PRESENT    Mucus PRESENT    Non Squamous Epithelial 0-5 (A) NONE SEEN    Comment: Performed at [facility], [address] [place], [place] [address]  Lactic acid, plasma     Status: None   Collection Time: 07/19/17 10:42 PM  Result Value Ref Range   Lactic Acid, Venous 1.2 0.5 - 1.9 mmol/L    Comment: Performed at [facility], [address] [place], [place] [address]  Urine culture     Status: Abnormal   Collection Time: 07/19/17 10:42 PM  Result Value Ref Range   Specimen Description      URINE, RANDOM Performed at [facility], [address],  [place], [place] [address]    Special Requests      NONE Performed at [facility], [place], [place] [address]    Culture (A)     >=100,000 COLONIES/mL GROUP B STREP(S.AGALACTIAE)ISOLATED TESTING AGAINST S. AGALACTIAE NOT ROUTINELY PERFORMED DUE TO PREDICTABILITY OF AMP/PEN/VAN SUSCEPTIBILITY. Performed at [facility], [place] [address], [place], [place] [address]    Report Status 07/21/2017 FINAL   Troponin I     Status: None   Collection Time: 07/19/17 10:42 PM  Result Value Ref Range   Troponin I <0.03 <0.03 ng/mL    Comment: Performed at [facility], [address] [place], [place] [address]  TSH     Status: None   Collection Time: 07/19/17 10:42 PM  Result Value Ref Range   TSH 4.486 0.350 - 4.500 uIU/mL    Comment: Performed by a 3rd Generation assay with a functional sensitivity of <=0.01 uIU/mL. Performed at [facility], [address] [place],  [address]   Blood Culture ID Panel (Reflexed)     Status: Abnormal   Collection Time: 07/19/17 10:42 PM  Result Value Ref Range   Enterococcus species NOT DETECTED NOT DETECTED   Listeria monocytogenes NOT DETECTED NOT DETECTED   Staphylococcus species DETECTED (A) NOT DETECTED    Comment: Methicillin (oxacillin) susceptible coagulase negative staphylococcus. Possible blood culture contaminant (unless isolated from more than one blood culture draw or clinical case suggests pathogenicity). No antibiotic treatment is indicated for blood  culture contaminants. CRITICAL RESULT CALLED TO, READ BACK BY AND VERIFIED WITH: DAVID BASANTI ON 07/21/17 AT 0023 [facility]    Staphylococcus aureus NOT DETECTED NOT DETECTED   Methicillin resistance NOT DETECTED NOT DETECTED   Streptococcus species NOT DETECTED NOT DETECTED   Streptococcus agalactiae NOT DETECTED NOT DETECTED   Streptococcus pneumoniae NOT DETECTED NOT DETECTED   Streptococcus pyogenes NOT DETECTED NOT DETECTED   Acinetobacter  baumannii NOT DETECTED NOT DETECTED   Enterobacteriaceae species NOT DETECTED NOT DETECTED   Enterobacter cloacae complex NOT DETECTED NOT DETECTED   Escherichia coli NOT DETECTED NOT DETECTED   Klebsiella oxytoca NOT DETECTED NOT DETECTED  Klebsiella pneumoniae NOT DETECTED NOT DETECTED   Proteus species NOT DETECTED NOT DETECTED   Serratia marcescens NOT DETECTED NOT DETECTED   Haemophilus influenzae NOT DETECTED NOT DETECTED   Neisseria meningitidis NOT DETECTED NOT DETECTED   Pseudomonas aeruginosa NOT DETECTED NOT DETECTED   Candida albicans NOT DETECTED NOT DETECTED   Candida glabrata NOT DETECTED NOT DETECTED   Candida krusei NOT DETECTED NOT DETECTED   Candida parapsilosis NOT DETECTED NOT DETECTED   Candida tropicalis NOT DETECTED NOT DETECTED    Comment: Performed at [facility], [address] [place], [place] [address]  Ammonia     Status: Abnormal   Collection Time: 07/19/17 11:26 PM  Result Value Ref Range   Ammonia 38 (H) 9 - 35 umol/L    Comment: Performed at [facility], [address], [place], [place] [address]  Urine Drug Screen, Qualitative ([place] only)     Status: Abnormal   Collection Time: 07/19/17 11:55 PM  Result Value Ref Range   Tricyclic, Ur Screen POSITIVE (A) NONE DETECTED   Amphetamines, Ur Screen NONE DETECTED NONE DETECTED   MDMA (Ecstasy)Ur Screen NONE DETECTED NONE DETECTED   Cocaine Metabolite,Ur [place] NONE DETECTED NONE DETECTED   Opiate, Ur Screen NONE DETECTED NONE DETECTED   Phencyclidine (PCP) Ur S NONE DETECTED NONE DETECTED   Cannabinoid 50 Ng, Ur [place] NONE DETECTED NONE DETECTED   Barbiturates, Ur Screen NONE DETECTED NONE DETECTED   Benzodiazepine, Ur Scrn NONE DETECTED NONE DETECTED   Methadone Scn, Ur NONE DETECTED NONE DETECTED    Comment: (NOTE) Tricyclics + metabolites, urine    Cutoff 1000 ng/mL Amphetamines + metabolites, urine  Cutoff 1000 ng/mL MDMA (Ecstasy), urine              Cutoff 500 ng/mL Cocaine  Metabolite, urine          Cutoff 300 ng/mL Opiate + metabolites, urine        Cutoff 300 ng/mL Phencyclidine (PCP), urine         Cutoff 25 ng/mL Cannabinoid, urine                 Cutoff 50 ng/mL Barbiturates + metabolites, urine  Cutoff 200 ng/mL Benzodiazepine, urine              Cutoff 200 ng/mL Methadone, urine                   Cutoff 300 ng/mL The urine drug screen provides only a preliminary, unconfirmed analytical test result and should not be used for non-medical purposes. Clinical consideration and professional judgment should be applied to any positive drug screen result due to possible interfering substances. A more specific alternate chemical method must be used in order to obtain a confirmed analytical result. Gas chromatography / mass spectrometry (GC/MS) is the preferred confirmat ory method. Performed at [facility], [address] [place], [place] [address]   MRSA PCR Screening     Status: Abnormal   Collection Time: 07/20/17  2:58 AM  Result Value Ref Range   MRSA by PCR (A) NEGATIVE    INVALID, UNABLE TO DETERMINE THE PRESENCE OF TARGET DNA DUE TO SPECIMEN INTEGRITY. RECOLLECTION REQUESTED.    Comment: SPOKE WITH Justice Britain AT 5009 07/20/17 REQUESTED RECOLLECTION SDR Performed at [facility], [address] [place], [place] [address]   Glucose, capillary     Status: Abnormal   Collection Time: 07/20/17  3:09 AM  Result Value Ref Range  Glucose-Capillary 112 (H) 65 - 99 mg/dL  Lactic acid, plasma     Status: None   Collection Time: 07/20/17  3:43 AM  Result Value Ref Range   Lactic Acid, Venous 1.1 0.5 - 1.9 mmol/L    Comment: Performed at [facility], [address] [place], [place] [address]  Ammonia     Status: None   Collection Time: 07/20/17  3:43 AM  Result Value Ref Range   Ammonia 34 9 - 35 umol/L    Comment: Performed at [facility], [address] [place], [place] [address]  Procalcitonin -  Baseline     Status: None   Collection Time: 07/20/17  3:43 AM  Result Value Ref Range   Procalcitonin <0.10 ng/mL    Comment:        Interpretation: PCT (Procalcitonin) <= 0.5 ng/mL: Systemic infection (sepsis) is not likely. Local bacterial infection is possible. (NOTE)       Sepsis PCT Algorithm           Lower Respiratory Tract                                      Infection PCT Algorithm    ----------------------------     ----------------------------         PCT < 0.25 ng/mL                PCT < 0.10 ng/mL         Strongly encourage             Strongly discourage   discontinuation of antibiotics    initiation of antibiotics    ----------------------------     -----------------------------       PCT 0.25 - 0.50 ng/mL            PCT 0.10 - 0.25 ng/mL               OR       >80% decrease in PCT            Discourage initiation of                                            antibiotics      Encourage discontinuation           of antibiotics    ----------------------------     -----------------------------         PCT >= 0.50 ng/mL              PCT 0.26 - 0.50 ng/mL               AND        <80% decrease in PCT             Encourage initiation of                                             antibiotics       Encourage continuation           of antibiotics    ----------------------------     -----------------------------        PCT >= 0.50 ng/mL  PCT > 0.50 ng/mL               AND         increase in PCT                  Strongly encourage                                      initiation of antibiotics    Strongly encourage escalation           of antibiotics                                     -----------------------------                                           PCT <= 0.25 ng/mL                                                 OR                                        > 80% decrease in PCT                                     Discontinue / Do not initiate                                              antibiotics Performed at [facility], [address] [place], [place] [address]   Basic metabolic panel     Status: Abnormal   Collection Time: 07/20/17  3:43 AM  Result Value Ref Range   Sodium 143 135 - 145 mmol/L   Potassium 3.5 3.5 - 5.1 mmol/L   Chloride 108 101 - 111 mmol/L   CO2 27 22 - 32 mmol/L   Glucose, Bld 114 (H) 65 - 99 mg/dL   BUN 14 6 - 20 mg/dL   Creatinine, Ser 0.85 0.44 - 1.00 mg/dL   Calcium 8.2 (L) 8.9 - 10.3 mg/dL   GFR calc non Af Amer >60 >60 mL/min   GFR calc Af Amer >60 >60 mL/min    Comment: (NOTE) The eGFR has been calculated using the CKD EPI equation. This calculation has not been validated in all clinical situations. eGFR's persistently <60 mL/min signify possible Chronic Kidney Disease.    Anion gap 8 5 - 15    Comment: Performed at [facility], [address] [place], [place] [address]  CBC     Status: Abnormal   Collection Time: 07/20/17  3:43 AM  Result Value Ref Range   WBC 7.3 3.6 - 11.0 K/uL   RBC 3.70 (L) 3.80 - 5.20 MIL/uL   Hemoglobin 11.8 (L) 12.0 - 16.0 g/dL  HCT 34.8 (L) 35.0 - 47.0 %   MCV 94.1 80.0 - 100.0 fL   MCH 31.9 26.0 - 34.0 pg   MCHC 33.9 32.0 - 36.0 g/dL   RDW 16.0 (H) 11.5 - 14.5 %   Platelets 108 (L) 150 - 440 K/uL    Comment: Performed at [facility], [address] [place], [place] [address]  Cortisol-am, blood     Status: Abnormal   Collection Time: 07/20/17  3:43 AM  Result Value Ref Range   Cortisol - AM 0.7 (L) 6.7 - 22.6 ug/dL    Comment: Performed at [facility] [address], [place], [place] [address]  MRSA PCR Screening     Status: None   Collection Time: 07/20/17  7:34 AM  Result Value Ref Range   MRSA by PCR NEGATIVE NEGATIVE    Comment:        The GeneXpert MRSA Assay (FDA approved for NASAL specimens only), is one component of a comprehensive MRSA colonization surveillance program. It is not intended to  diagnose MRSA infection nor to guide or monitor treatment for MRSA infections. Performed at [facility], [address] [place], [place] [address]   Glucose, capillary     Status: None   Collection Time: 07/20/17  7:35 AM  Result Value Ref Range   Glucose-Capillary 94 65 - 99 mg/dL  Glucose, capillary     Status: Abnormal   Collection Time: 07/20/17 11:32 AM  Result Value Ref Range   Glucose-Capillary 148 (H) 65 - 99 mg/dL  Glucose, capillary     Status: None   Collection Time: 07/20/17  5:15 PM  Result Value Ref Range   Glucose-Capillary 73 65 - 99 mg/dL  Glucose, capillary     Status: Abnormal   Collection Time: 07/20/17 10:32 PM  Result Value Ref Range   Glucose-Capillary 115 (H) 65 - 99 mg/dL  Procalcitonin     Status: None   Collection Time: 07/21/17  6:26 AM  Result Value Ref Range   Procalcitonin <0.10 ng/mL    Comment:        Interpretation: PCT (Procalcitonin) <= 0.5 ng/mL: Systemic infection (sepsis) is not likely. Local bacterial infection is possible. (NOTE)       Sepsis PCT Algorithm           Lower Respiratory Tract                                      Infection PCT Algorithm    ----------------------------     ----------------------------         PCT < 0.25 ng/mL                PCT < 0.10 ng/mL         Strongly encourage             Strongly discourage   discontinuation of antibiotics    initiation of antibiotics    ----------------------------     -----------------------------       PCT 0.25 - 0.50 ng/mL            PCT 0.10 - 0.25 ng/mL               OR       >80% decrease in PCT            Discourage initiation of  antibiotics      Encourage discontinuation           of antibiotics    ----------------------------     -----------------------------         PCT >= 0.50 ng/mL              PCT 0.26 - 0.50 ng/mL               AND        <80% decrease in PCT             Encourage initiation of                                              antibiotics       Encourage continuation           of antibiotics    ----------------------------     -----------------------------        PCT >= 0.50 ng/mL                  PCT > 0.50 ng/mL               AND         increase in PCT                  Strongly encourage                                      initiation of antibiotics    Strongly encourage escalation           of antibiotics                                     -----------------------------                                           PCT <= 0.25 ng/mL                                                 OR                                        > 80% decrease in PCT                                     Discontinue / Do not initiate                                             antibiotics Performed at [facility], [address] [place], [place] [address]   Basic metabolic panel     Status: Abnormal   Collection Time: 07/21/17  6:26 AM  Result Value Ref Range   Sodium 144 135 - 145 mmol/L   Potassium 4.1 3.5 - 5.1 mmol/L   Chloride 112 (H) 101 - 111 mmol/L   CO2 26 22 - 32 mmol/L   Glucose, Bld 152 (H) 65 - 99 mg/dL   BUN 9 6 - 20 mg/dL   Creatinine, Ser 0.67 0.44 - 1.00 mg/dL   Calcium 8.8 (L) 8.9 - 10.3 mg/dL   GFR calc non Af Amer >60 >60 mL/min   GFR calc Af Amer >60 >60 mL/min    Comment: (NOTE) The eGFR has been calculated using the CKD EPI equation. This calculation has not been validated in all clinical situations. eGFR's persistently <60 mL/min signify possible Chronic Kidney Disease.    Anion gap 6 5 - 15    Comment: Performed at [facility], [address] [place], [place] [address]  Magnesium     Status: None   Collection Time: 07/21/17  6:26 AM  Result Value Ref Range   Magnesium 2.1 1.7 - 2.4 mg/dL    Comment: Performed at [facility], [address] [place], [place] [address]  Phosphorus     Status: None   Collection Time: 07/21/17  6:26  AM  Result Value Ref Range   Phosphorus 3.7 2.5 - 4.6 mg/dL    Comment: Performed at [facility], [address] [place], [place] [address]  Glucose, capillary     Status: Abnormal   Collection Time: 07/21/17  7:28 AM  Result Value Ref Range   Glucose-Capillary 157 (H) 65 - 99 mg/dL  Ammonia     Status: Abnormal   Collection Time: 07/21/17  9:14 AM  Result Value Ref Range   Ammonia 45 (H) 9 - 35 umol/L    Comment: Performed at [facility], [address] [place], [place] [address]  Glucose, capillary     Status: Abnormal   Collection Time: 07/21/17 12:00 PM  Result Value Ref Range   Glucose-Capillary 201 (H) 65 - 99 mg/dL  Glucose, capillary     Status: Abnormal   Collection Time: 07/21/17  4:33 PM  Result Value Ref Range   Glucose-Capillary 125 (H) 65 - 99 mg/dL   Comment 1 Notify RN    *Note: Due to a large number of results and/or encounters for the requested time period, some results have not been displayed. A complete set of results can be found in Results Review.    Current Facility-Administered Medications  Medication Dose Route Frequency Provider Last Rate Last Dose  . acetaminophen (TYLENOL) tablet 650 mg  650 mg Oral Q6H PRN Saundra Shelling, MD   650 mg at 07/21/17 1234   Or  . acetaminophen (TYLENOL) suppository 650 mg  650 mg Rectal Q6H PRN Pyreddy, Reatha Harps, MD      . albuterol (PROVENTIL) (2.5 MG/3ML) 0.083% nebulizer solution 2.5 mg  2.5 mg Inhalation Q6H PRN Pyreddy, Reatha Harps, MD      . aspirin tablet 325 mg  325 mg Oral Daily Pyreddy, Reatha Harps, MD   325 mg at 07/21/17 1052  . atorvastatin (LIPITOR) tablet 40 mg  40 mg Oral q1800 Saundra Shelling, MD   40 mg at 07/21/17 1715  . [START ON 07/22/2017] buPROPion (WELLBUTRIN XL) 24 hr tablet 150 mg  150 mg Oral Daily Dustin Flock, MD      . cefTRIAXone (ROCEPHIN) 1 g in sodium chloride 0.9 % 100 mL IVPB  1 g Intravenous Q24H Lafayette Dragon,  MD   Stopped at 07/21/17 1919  . clonazePAM (KLONOPIN) tablet  0.5 mg  0.5 mg Oral BID PRN Ijanae Macapagal T, MD      . dextrose 5 % in lactated ringers infusion   Intravenous Continuous Lafayette Dragon, MD 50 mL/hr at 07/21/17 1544    . enoxaparin (LOVENOX) injection 40 mg  40 mg Subcutaneous Q24H Saundra Shelling, MD   40 mg at 07/21/17 0218  . ferrous sulfate tablet 325 mg  325 mg Oral BID Saundra Shelling, MD   325 mg at 07/21/17 2006  . hydrocortisone sodium succinate (SOLU-CORTEF) 100 MG injection 75 mg  75 mg Intravenous Q8H Lafayette Dragon, MD   75 mg at 07/21/17 1556  . insulin aspart (novoLOG) injection 0-15 Units  0-15 Units Subcutaneous TID WC Lafayette Dragon, MD   1 Units at 07/21/17 1715  . insulin aspart (novoLOG) injection 0-5 Units  0-5 Units Subcutaneous QHS Cammie Sickle A, MD      . lactulose (CHRONULAC) 10 GM/15ML solution 30 g  30 g Oral TID Saundra Shelling, MD   30 g at 07/21/17 1052  . lamoTRIgine (LAMICTAL) tablet 100 mg  100 mg Oral BID Saundra Shelling, MD   100 mg at 07/21/17 2006  . mometasone-formoterol (DULERA) 100-5 MCG/ACT inhaler 2 puff  2 puff Inhalation BID Lafayette Dragon, MD   2 puff at 07/21/17 [phone]  . ondansetron (ZOFRAN) tablet 4 mg  4 mg Oral Q6H PRN Saundra Shelling, MD       Or  . ondansetron (ZOFRAN) injection 4 mg  4 mg Intravenous Q6H PRN Pyreddy, Reatha Harps, MD      . oxybutynin (DITROPAN-XL) 24 hr tablet 5 mg  5 mg Oral Daily Pyreddy, Reatha Harps, MD   5 mg at 07/21/17 0748  . polymixin-bacitracin (POLYSPORIN) ointment   Topical TID Saundra Shelling, MD      . rOPINIRole (REQUIP) tablet 3 mg  3 mg Oral QHS Pyreddy, Reatha Harps, MD   3 mg at 07/21/17 2006    Musculoskeletal: Strength & Muscle Tone: within normal limits Gait & Station: normal Patient leans: N/A  Psychiatric Specialty Exam: Physical Exam  Constitutional: She appears well-developed and well-nourished.  HENT:  Head: Normocephalic and atraumatic.  Eyes: Conjunctivae are normal. Pupils are equal, round, and reactive to light.  Neck: Normal range of motion.   Cardiovascular: Normal heart sounds.  Respiratory: Effort normal.  GI: Soft.  Musculoskeletal: Normal range of motion.  Neurological: She is alert.  Skin: Skin is warm and dry.  Psychiatric: Judgment normal. Her mood appears anxious. Her speech is delayed. She is slowed. Cognition and memory are normal. She expresses no homicidal and no suicidal ideation.    Review of Systems  Constitutional: Negative.   HENT: Negative.   Eyes: Negative.   Respiratory: Negative.   Cardiovascular: Negative.   Gastrointestinal: Negative.   Musculoskeletal: Negative.   Skin: Negative.   Neurological: Negative.   Psychiatric/Behavioral: Negative for depression, hallucinations, memory loss, substance abuse and suicidal ideas. The patient is nervous/anxious. The patient does not have insomnia.     Blood pressure (!) 142/64, pulse 95, temperature 98.5 F (36.9 C), temperature source Oral, resp. rate 18, height 5' 3"  (1.6 m), weight 102.1 kg (225 lb), SpO2 94 %.Body mass index is 39.86 kg/m.  General Appearance: Casual  Eye Contact:  Fair  Speech:  Slow  Volume:  Decreased  Mood:  Anxious  Affect:  Constricted  Thought Process:  Goal Directed  Orientation:  Full (Time, Place, and Person)  Thought Content:  Rumination  Suicidal Thoughts:  No  Homicidal Thoughts:  No  Memory:  Immediate;   Fair Recent;   Fair Remote;   Fair  Judgement:  Impaired  Insight:  Shallow  Psychomotor Activity:  Decreased  Concentration:  Concentration: Fair  Recall:  [employer] of Knowledge:  Fair  Language:  Fair  Akathisia:  No  Handed:  Right  AIMS (if indicated):     Assets:  Desire for Improvement Housing  ADL's:  Impaired  Cognition:  Impaired,  Mild  Sleep:        Treatment Plan Summary: Plan 67 year old woman with a very unclear past psychiatric history although it sounds like there is evidence that she has had some chronic mental health issues.  Her current presentation is not obviously psychotic and  she denies being depressed but she does seem rather childlike and tends to repeat herself.  In any case she was opening her eyes and was able to answer direct questions with me and was not showing any dangerous behavior.  I am not going to restart the antipsychotic.  It looks like she is now on an antidepressant.  Not sure who is following up with this outside.  I did go ahead and add clonazepam as a as needed for anxiety.  Hopefully patient will be able to go back to the [facility] relatively soon.  I will follow up as needed.  Disposition: No evidence of imminent risk to self or others at present.   Patient does not meet criteria for psychiatric inpatient admission. Supportive therapy provided about ongoing stressors.  Alethia Berthold, MD 07/21/2017 8:39 PM

## 2017-07-21 NOTE — Evaluation (Signed)
Physical Therapy Evaluation Patient Details Name: Stephanie [name] MRN: [id number] DOB: [birth year] Today's Date: 07/21/2017   History of Present Illness  Pt admitted for AMS with acute encephalopathy. History includes liver cirrhosis, anxiety, DM, GERD, and bipolar. Pt currently from the [facility].  Clinical Impression  Pt is a lethargic 67 year old female who was admitted for acute encephalopathy. Pt sleeping upon arrival, however will awaken to sternal rubs. Pt able to briefly open eyes, then quickly falls back asleep.  Pt very limited in ability to participate due to lethargy, needs +2 for attempted bed mobility. Unable to fully sit at EOB. Keeps eyes open, however does not respond to verbal commands or answer questions. She responds "I'm cold" to therapist. Unable to further progress mobility. Pt demonstrates deficits with strength/cognition/mobility. Pt is not currently at baseline, would benefit from further evaluation to ensure progress that requires SNF stay. Would benefit from skilled PT to address above deficits and promote optimal return to PLOF; recommend transition to STR upon discharge from acute hospitalization depending on pt participation.       Follow Up Recommendations SNF    Equipment Recommendations  (TBD)    Recommendations for Other Services       Precautions / Restrictions Precautions Precautions: Fall Restrictions Weight Bearing Restrictions: No      Mobility  Bed Mobility Overal bed mobility: Needs Assistance Bed Mobility: Supine to Sit     Supine to sit: Total assist;+2 for physical assistance     General bed mobility comments: 3 attempts for supine to sit. Each time requires sternal rub for pt to awaken to participate. Pt able to keep eyes open, however does not follow commands. Unable to fully get to EOB. No righting response noted with change of position. Returned to supine and needs total assist +2 to slide up towards [facility]  Transfers                  General transfer comment: unable to perform  Ambulation/Gait                Stairs            Wheelchair Mobility    Modified Rankin (Stroke Patients Only)       Balance Overall balance assessment: Needs assistance   Sitting balance-Leahy Scale: Zero                                       Pertinent Vitals/Pain Pain Assessment: (unable to assess)    Home Living Family/patient expects to be discharged to:: Assisted living               Home Equipment: Walker - 2 wheels;Wheelchair - manual      Prior Function Level of Independence: Independent with assistive device(s)         Comments: all PLOF information obtained from chart as pt is poor historian. Apparently observed ambulating to dining hall with RW prior to hospital admission     Hand Dominance        Extremity/Trunk Assessment   Upper Extremity Assessment Upper Extremity Assessment: Difficult to assess due to impaired cognition(Unable to formally test; unable to hold arms up off bed)    Lower Extremity Assessment Lower Extremity Assessment: Difficult to assess due to impaired cognition;Generalized weakness(muscle bulk apparent, however unable to participate in test)       Communication   Communication:  No difficulties  Cognition Arousal/Alertness: Lethargic Behavior During Therapy: Flat affect Overall Cognitive Status: Impaired/Different from baseline                                        General Comments      Exercises     Assessment/Plan    PT Assessment Patient needs continued PT services  PT Problem List Obesity;Decreased cognition;Decreased mobility;Decreased balance;Decreased strength       PT Treatment Interventions Gait training;Therapeutic activities;Therapeutic exercise;Balance training    PT Goals (Current goals can be found in the Care Plan section)  Acute Rehab PT Goals Patient Stated Goal: unable to state PT  Goal Formulation: Patient unable to participate in goal setting Time For Goal Achievement: 08/04/17 Potential to Achieve Goals: Fair    Frequency Min 2X/week   Barriers to discharge        Co-evaluation               AM-PAC PT "6 Clicks" Daily Activity  Outcome Measure Difficulty turning over in bed (including adjusting bedclothes, sheets and blankets)?: Unable Difficulty moving from lying on back to sitting on the side of the bed? : Unable Difficulty sitting down on and standing up from a chair with arms (e.g., wheelchair, bedside commode, etc,.)?: Unable Help needed moving to and from a bed to chair (including a wheelchair)?: Total Help needed walking in hospital room?: Total Help needed climbing 3-5 steps with a railing? : Total 6 Click Score: 6    End of Session   Activity Tolerance: Patient limited by lethargy;Treatment limited secondary to medical complications (Comment) Patient left: in bed;with SCD's reapplied Nurse Communication: Mobility status PT Visit Diagnosis: Muscle weakness (generalized) (M62.81);Difficulty in walking, not elsewhere classified (R26.2)    Time: 7169-6789 PT Time Calculation (min) (ACUTE ONLY): 20 min   Charges:   PT Evaluation $PT Eval High Complexity: 1 High     PT G CodesGreggory [name], PT, DPT [phone]   Stephanie [name] 07/21/2017, 1:53 PM

## 2017-07-21 NOTE — Clinical Social Work Note (Signed)
Clinical Social Work Assessment  Patient Details  Name: Stephanie [name] MRN: [id number] Date of Birth: 1950/10/21  Date of referral:  07/21/17               Reason for consult:  Facility Placement                Permission sought to share information with:  Facility Sport and exercise psychologist, Case Manager, Family Supports, PCP Permission granted to share information::  Yes, Verbal Permission Granted  Name::     [name] [phone]  [phone]   Agency::  ALF admissions  Relationship::     Contact Information:     Housing/Transportation Living arrangements for the past 2 months:  [place] of Information:  Patient Patient Interpreter Needed:  None Criminal Activity/Legal Involvement Pertinent to Current Situation/Hospitalization:  No - Comment as needed Significant Relationships:  Other(Comment) Lives with:  Facility Resident Do you feel safe going back to the place where you live?  Yes Need for family participation in patient care:  No (Coment)  Care giving concerns:  Patient did not express any concerns about returning back to [facility].   Social Worker assessment / plan:  Patient is a 67 year old female who is alert and oriented x4.  Patient is from [facility], patient states she has been very happy there and would like to return as soon as she can.  Patient states she is receiving home health PT and feels like it is helping her.  Patient was explained role of CSW and process for coordinating with [facility] to have her return.  Patient gave CSW permission to send information back to [facility] to coordinate discharge planning.  Patient did not have any other questions or concerns.  Employment status:  Retired Nurse, adult PT Recommendations:    Information / Referral to community resources:     Patient/Family's Response to care:  Patient is in agreement to returning back to [facility] with [place]  PT.  Patient/Family's Understanding of and Emotional Response to Diagnosis, Current Treatment, and Prognosis:  Patient expressed she does not like to be in the hospital and wants to return back to [facility] as soon as she can.  Emotional Assessment Appearance:  Appears stated age Attitude/Demeanor/Rapport:    Affect (typically observed):  Appropriate Orientation:  Oriented to Place, Oriented to Self, Oriented to  Time, Oriented to Situation Alcohol / Substance use:  Not Applicable Psych involvement (Current and /or in the community):  No (Comment)  Discharge Needs  Concerns to be addressed:  Care Coordination Readmission within the last 30 days:  No Current discharge risk:  None Barriers to Discharge:  Continued Medical Work up   Anell Barr 07/21/2017, 6:35 PM

## 2017-07-21 NOTE — Progress Notes (Signed)
Inpatient Diabetes Program Recommendations  AACE/ADA: New Consensus Statement on Inpatient Glycemic Control (2015)  Target Ranges:  Prepandial:   less than 140 mg/dL      Peak postprandial:   less than 180 mg/dL (1-2 hours)      Critically ill patients:  140 - 180 mg/dL   Lab Results  Component Value Date   GLUCAP 201 (H) 07/21/2017   HGBA1C 5.2 07/20/2016    Review of Glycemic Control  Results for [name], [name] (MRN [id number]) as of 07/21/2017 13:11  Ref. Range 07/20/2017 11:32 07/20/2017 17:15 07/20/2017 22:32 07/21/2017 07:28 07/21/2017 12:00  Glucose-Capillary Latest Ref Range: 65 - 99 mg/dL 148 (H) 73 115 (H) 157 (H) 201 (H)    Diabetes history: Type 2 Outpatient Diabetes medications: Lantus 28 units qhs, Novolog sliding scale tid with meals  Current orders for Inpatient glycemic control: Novolog 0-15 units tid, Novolog 0-5 units qhs  Inpatient Diabetes Program Recommendations: Consider adding Novolog 2 units   Gentry Fitz, RN, [place], [place], CDE Diabetes Coordinator Inpatient Diabetes Program  [phone] (Team Pager) [phone] [facility] Office) Delay in signing chart on 07/21/17

## 2017-07-21 NOTE — Progress Notes (Signed)
Pt transferred to room 219 on 2C bed.  No need for supp O2, VSS, pt moderately responsive.  MD Celesta Aver placed new orders for the floor prior to transfer.  Chart and belongings transferred with her.  Report called to Seth Bake

## 2017-07-21 NOTE — Progress Notes (Signed)
PHARMACY - PHYSICIAN COMMUNICATION CRITICAL VALUE ALERT - BLOOD CULTURE IDENTIFICATION (BCID)  Stephanie [name] is an 67 y.o. female who presented to [facility] on 07/19/2017 with a chief complaint of AMS  Assessment:  AMS improving, initially hypotensive, hypothermic (include suspected source if known)  Name of physician (or Provider) Contacted: Harless Nakayama  Current antibiotics: Ceftriaxone  Changes to prescribed antibiotics recommended:  Patient is on recommended antibiotics - No changes needed  Results for orders placed or performed during the hospital encounter of 07/19/17  Blood Culture ID Panel (Reflexed) (Collected: 07/19/2017 10:42 PM)  Result Value Ref Range   Enterococcus species NOT DETECTED NOT DETECTED   Listeria monocytogenes NOT DETECTED NOT DETECTED   Staphylococcus species DETECTED (A) NOT DETECTED   Staphylococcus aureus NOT DETECTED NOT DETECTED   Methicillin resistance NOT DETECTED NOT DETECTED   Streptococcus species NOT DETECTED NOT DETECTED   Streptococcus agalactiae NOT DETECTED NOT DETECTED   Streptococcus pneumoniae NOT DETECTED NOT DETECTED   Streptococcus pyogenes NOT DETECTED NOT DETECTED   Acinetobacter baumannii NOT DETECTED NOT DETECTED   Enterobacteriaceae species NOT DETECTED NOT DETECTED   Enterobacter cloacae complex NOT DETECTED NOT DETECTED   Escherichia coli NOT DETECTED NOT DETECTED   Klebsiella oxytoca NOT DETECTED NOT DETECTED   Klebsiella pneumoniae NOT DETECTED NOT DETECTED   Proteus species NOT DETECTED NOT DETECTED   Serratia marcescens NOT DETECTED NOT DETECTED   Haemophilus influenzae NOT DETECTED NOT DETECTED   Neisseria meningitidis NOT DETECTED NOT DETECTED   Pseudomonas aeruginosa NOT DETECTED NOT DETECTED   Candida albicans NOT DETECTED NOT DETECTED   Candida glabrata NOT DETECTED NOT DETECTED   Candida krusei NOT DETECTED NOT DETECTED   Candida parapsilosis NOT DETECTED NOT DETECTED   Candida tropicalis NOT DETECTED NOT  DETECTED   Tobie Lords, PharmD, BCPS Clinical Pharmacist 07/21/2017

## 2017-07-21 NOTE — Progress Notes (Signed)
[place] at [facility]                                                                                                                                                                                  Patient Demographics   Stephanie [name], is a 67 y.o. female, DOB - [birth year], MWU:[id number]  Admit date - 07/19/2017   Admitting Physician Saundra Shelling, MD  Outpatient Primary MD for the patient is Sarajane Jews, MD   LOS - 1  Subjective: Patient's eyes closed, she is able to answer some questions     Review of Systems:   CONSTITUTIONAL:  Limited  Vitals:   Vitals:   07/21/17 1000 07/21/17 1100 07/21/17 1200 07/21/17 1300  BP:  (!) 143/77 (!) 117/58 118/73  Pulse: 85 87 83 89  Resp: 11 18 15 16   Temp:   97.9 F (36.6 C)   TempSrc:   Oral   SpO2: 96% 95% 97% 96%  Weight:      Height:        Wt Readings from Last 3 Encounters:  07/19/17 225 lb (102.1 kg)  07/17/17 225 lb (102.1 kg)  07/04/17 233 lb 9.6 oz (106 kg)     Intake/Output Summary (Last 24 hours) at 07/21/2017 1506 Last data filed at 07/21/2017 1200 Gross per 24 hour  Intake 1693.75 ml  Output 2660 ml  Net -966.25 ml    Physical Exam:   GENERAL: Morbidly obese patient chronically ill-appearing HEAD, EYES, EARS, NOSE AND THROAT: Atraumatic, normocephalic. . Pupils equal and reactive to light. Sclerae anicteric. No conjunctival injection. No oro-pharyngeal erythema.  NECK: Supple. There is no jugular venous distention. No bruits, no lymphadenopathy, no thyromegaly.  HEART: Regular rate and rhythm,. No murmurs, no rubs, no clicks.  LUNGS: Clear to auscultation bilaterally. No rales or rhonchi. No wheezes.  ABDOMEN: Soft, flat, nontender, nondistended. Has good bowel sounds. No hepatosplenomegaly appreciated.  EXTREMITIES: No evidence of any cyanosis, clubbing, or peripheral edema.  +2 pedal and radial pulses bilaterally.  NEUROLOGIC: Patient keeping her eyes closed but able to  answer questions follow commands sKIN: Moist and warm with no rashes appreciated.  Psych: Not anxious, depressed LN: No inguinal LN enlargement    Antibiotics   Anti-infectives (From admission, onward)   Start     Dose/Rate Route Frequency Ordered Stop   07/20/17 1800  cefTRIAXone (ROCEPHIN) 1 g in sodium chloride 0.9 % 100 mL IVPB     1 g 200 mL/hr over 30 Minutes Intravenous Every 24 hours 07/20/17 1037     07/20/17 1000  cefTRIAXone (ROCEPHIN) 2 g in sodium chloride 0.9 % 100 mL IVPB  Status:  Discontinued     2 g 200 mL/hr over 30 Minutes Intravenous Every 24 hours 07/20/17 0151 07/20/17 0223   07/20/17 0900  vancomycin (VANCOCIN) IVPB 1000 mg/200 mL premix  Status:  Discontinued     1,000 mg 200 mL/hr over 60 Minutes Intravenous Every 12 hours 07/20/17 0433 07/20/17 1037   07/20/17 0230  piperacillin-tazobactam (ZOSYN) IVPB 3.375 g  Status:  Discontinued     3.375 g 12.5 mL/hr over 240 Minutes Intravenous Every 8 hours 07/20/17 0224 07/20/17 1037   07/20/17 0230  vancomycin (VANCOCIN) IVPB 1000 mg/200 mL premix     1,000 mg 200 mL/hr over 60 Minutes Intravenous  Once 07/20/17 0224 07/20/17 0617   07/20/17 0200  cefTRIAXone (ROCEPHIN) 1 g in sodium chloride 0.9 % 100 mL IVPB  Status:  Discontinued     1 g 200 mL/hr over 30 Minutes Intravenous  Once 07/20/17 0150 07/20/17 0223   07/20/17 0000  cefTRIAXone (ROCEPHIN) 1 g in sodium chloride 0.9 % 100 mL IVPB     1 g 200 mL/hr over 30 Minutes Intravenous  Once 07/19/17 2348 07/20/17 0139      Medications   Scheduled Meds: . aspirin  325 mg Oral Daily  . atorvastatin  40 mg Oral q1800  . buPROPion  150 mg Oral Daily  . enoxaparin (LOVENOX) injection  40 mg Subcutaneous Q24H  . ferrous sulfate  325 mg Oral BID  . hydrocortisone sod succinate (SOLU-CORTEF) inj  75 mg Intravenous Q8H  . insulin aspart  0-15 Units Subcutaneous TID WC  . insulin aspart  0-5 Units Subcutaneous QHS  . lactulose  30 g Oral TID  . lamoTRIgine  100  mg Oral BID  . mometasone-formoterol  2 puff Inhalation BID  . oxybutynin  5 mg Oral Daily  . polymixin-bacitracin   Topical TID  . rOPINIRole  3 mg Oral QHS   Continuous Infusions: . cefTRIAXone (ROCEPHIN)  IV Stopped (07/20/17 1811)  . dextrose 5% lactated ringers     PRN Meds:.acetaminophen **OR** acetaminophen, albuterol, ondansetron **OR** ondansetron (ZOFRAN) IV   Data Review:   Micro Results Recent Results (from the past 240 hour(s))  Blood Culture (routine x 2)     Status: None (Preliminary result)   Collection Time: 07/19/17 10:42 PM  Result Value Ref Range Status   Specimen Description BLOOD lLT AC  Final   Special Requests   Final    BOTTLES DRAWN AEROBIC AND ANAEROBIC Blood Culture adequate volume   Culture  Setup Time   Final    Organism ID to follow IN BOTH AEROBIC AND ANAEROBIC BOTTLES GRAM POSITIVE COCCI CRITICAL RESULT CALLED TO, READ BACK BY AND VERIFIED WITH: DAVID BASANTI ON 07/21/17 AT 0023 [facility] Performed at [facility], [address] [place], [place] [address]    Culture GRAM POSITIVE COCCI  Final   Report Status PENDING  Incomplete  Blood Culture (routine x 2)     Status: None (Preliminary result)   Collection Time: 07/19/17 10:42 PM  Result Value Ref Range Status   Specimen Description BLOOD RT FOREARM  Final   Special Requests   Final    BOTTLES DRAWN AEROBIC AND ANAEROBIC Blood Culture results may not be optimal due to an excessive volume of blood received in culture bottles   Culture   Final    NO GROWTH 2 DAYS Performed at [facility], [address] [place], [place] [address]    Report Status PENDING  Incomplete  Urine culture  Status: Abnormal   Collection Time: 07/19/17 10:42 PM  Result Value Ref Range Status   Specimen Description   Final    URINE, RANDOM Performed at [facility], [address], [place], [place] [address]    Special Requests   Final    NONE Performed at [facility],  [address] [place], [place] [address]    Culture (A)  Final    >=100,000 COLONIES/mL GROUP B STREP(S.AGALACTIAE)ISOLATED TESTING AGAINST S. AGALACTIAE NOT ROUTINELY PERFORMED DUE TO PREDICTABILITY OF AMP/PEN/VAN SUSCEPTIBILITY. Performed at [facility], [address] [place], [place] [address]    Report Status 07/21/2017 FINAL  Final  Blood Culture ID Panel (Reflexed)     Status: Abnormal   Collection Time: 07/19/17 10:42 PM  Result Value Ref Range Status   Enterococcus species NOT DETECTED NOT DETECTED Final   Listeria monocytogenes NOT DETECTED NOT DETECTED Final   Staphylococcus species DETECTED (A) NOT DETECTED Final    Comment: Methicillin (oxacillin) susceptible coagulase negative staphylococcus. Possible blood culture contaminant (unless isolated from more than one blood culture draw or clinical case suggests pathogenicity). No antibiotic treatment is indicated for blood  culture contaminants. CRITICAL RESULT CALLED TO, READ BACK BY AND VERIFIED WITH: DAVID BASANTI ON 07/21/17 AT 0023 [facility]    Staphylococcus aureus NOT DETECTED NOT DETECTED Final   Methicillin resistance NOT DETECTED NOT DETECTED Final   Streptococcus species NOT DETECTED NOT DETECTED Final   Streptococcus agalactiae NOT DETECTED NOT DETECTED Final   Streptococcus pneumoniae NOT DETECTED NOT DETECTED Final   Streptococcus pyogenes NOT DETECTED NOT DETECTED Final   Acinetobacter baumannii NOT DETECTED NOT DETECTED Final   Enterobacteriaceae species NOT DETECTED NOT DETECTED Final   Enterobacter cloacae complex NOT DETECTED NOT DETECTED Final   Escherichia coli NOT DETECTED NOT DETECTED Final   Klebsiella oxytoca NOT DETECTED NOT DETECTED Final   Klebsiella pneumoniae NOT DETECTED NOT DETECTED Final   Proteus species NOT DETECTED NOT DETECTED Final   Serratia marcescens NOT DETECTED NOT DETECTED Final   Haemophilus influenzae NOT DETECTED NOT DETECTED Final   Neisseria meningitidis NOT DETECTED NOT  DETECTED Final   Pseudomonas aeruginosa NOT DETECTED NOT DETECTED Final   Candida albicans NOT DETECTED NOT DETECTED Final   Candida glabrata NOT DETECTED NOT DETECTED Final   Candida krusei NOT DETECTED NOT DETECTED Final   Candida parapsilosis NOT DETECTED NOT DETECTED Final   Candida tropicalis NOT DETECTED NOT DETECTED Final    Comment: Performed at [facility], [address] [place], [place] [address]  MRSA PCR Screening     Status: Abnormal   Collection Time: 07/20/17  2:58 AM  Result Value Ref Range Status   MRSA by PCR (A) NEGATIVE Final    INVALID, UNABLE TO DETERMINE THE PRESENCE OF TARGET DNA DUE TO SPECIMEN INTEGRITY. RECOLLECTION REQUESTED.    Comment: SPOKE WITH Justice Britain AT [id number] 07/20/17 REQUESTED RECOLLECTION SDR Performed at [facility], [address] [place], [place] [address]   MRSA PCR Screening     Status: None   Collection Time: 07/20/17  7:34 AM  Result Value Ref Range Status   MRSA by PCR NEGATIVE NEGATIVE Final    Comment:        The GeneXpert MRSA Assay (FDA approved for NASAL specimens only), is one component of a comprehensive MRSA colonization surveillance program. It is not intended to diagnose MRSA infection nor to guide or monitor treatment for MRSA infections. Performed at [facility],  [place], [place] [address]     Radiology Reports Dg Pelvis 1-2 Views  Result Date: 07/19/2017 CLINICAL DATA:  67 year old female with altered mental status, unresponsive. EXAM: PELVIS - 1-2 VIEW COMPARISON:  CT Abdomen and Pelvis 04/23/2017 FINDINGS: Femoral heads are normally located. Hip joint spaces remain normal. Proximal femurs appear intact. The pelvis appears stable and intact. Negative SI joints. Chronic postoperative changes to the lower lumbar spine including L3-L4 transpedicular hardware. Negative visible bowel gas pattern. IMPRESSION: No acute findings. Electronically Signed   By: Genevie Ann M.D.    On: 07/19/2017 23:48   Ct Head Wo Contrast  Result Date: 07/19/2017 CLINICAL DATA:  67 year old female with altered mental status, unresponsive. EXAM: CT HEAD WITHOUT CONTRAST TECHNIQUE: Contiguous axial images were obtained from the base of the skull through the vertex without intravenous contrast. COMPARISON:  Head CT 04/23/2017 and earlier. FINDINGS: Brain: Cerebral volume is stable and normal for age. No midline shift, ventriculomegaly, mass effect, evidence of mass lesion, intracranial hemorrhage or evidence of cortically based acute infarction. Gray-white matter differentiation is within normal limits throughout the brain. No cortical encephalomalacia identified. Vascular: Calcified atherosclerosis at the skull base. No suspicious intracranial vascular hyperdensity. Skull: Negative. Sinuses/Orbits: There is a right nasopharyngeal airway in place. Small volume retained secretions in the right nasal cavity and nasopharynx. Visualized paranasal sinuses and mastoids are stable and well pneumatized. Other: No acute orbit or scalp soft tissue findings. IMPRESSION: 1. Stable and normal for age non contrast CT appearance of the brain. 2. Right nasopharyngeal airway in place. Electronically Signed   By: Genevie Ann M.D.   On: 07/19/2017 23:28   Ct Chest Wo Contrast  Result Date: 07/07/2017 CLINICAL DATA:  Chest pain and shortness of breath for 3 weeks. Follow-up of pulmonary nodule. EXAM: CT CHEST WITHOUT CONTRAST TECHNIQUE: Multidetector CT imaging of the chest was performed following the standard protocol without IV contrast. COMPARISON:  Plain films 06/16/2017.  CT 04/23/2017 FINDINGS: Cardiovascular: Aortic and branch vessel atherosclerosis. Tortuous thoracic aorta. Mild cardiomegaly, without pericardial effusion. Multivessel coronary artery atherosclerosis. Mediastinum/Nodes: Small middle mediastinal nodes are not pathologic by size criteria. Hilar regions poorly evaluated without intravenous contrast.  Lungs/Pleura: No pleural fluid. 3 mm right upper lobe pulmonary nodule on image 46/series 5 is not readily apparent on 06/28/2016 exam, but is felt to be similar to 04/23/2017. Mosaic attenuation throughout both lungs. This is favored to be related to mosaic perfusion. Hypoventilation bilaterally. No focal airspace disease. Upper Abdomen: Marked cirrhosis. Incompletely imaged splenomegaly. Normal imaged portions of the stomach, adrenal glands, kidneys. Pancreatic fatty replacement. Musculoskeletal: Dorsal spinal stimulator. Lower cervical spine fixation. IMPRESSION: 1. Mild motion degradation. 2. Mosaic attenuation throughout both lungs, favored to be related to mosaic perfusion. Given low lung volumes and patient body habitus, favored to be related to air trapping. Vascular phenomenon, including chronic pulmonary embolism, could look similar. 3. 3 mm right upper lobe pulmonary nodule, not readily apparent on exam of 1 year ago. No follow-up needed if patient is low-risk. Non-contrast chest CT can be considered in 12 months if patient is high-risk. This recommendation follows the consensus statement: Guidelines for Management of Incidental Pulmonary Nodules Detected on CT Images: From the Fleischner Society 2017; Radiology 2017; 284:228-243. 4. Age advanced coronary artery atherosclerosis. Recommend assessment of coronary risk factors and consideration of medical therapy. 5.  Aortic Atherosclerosis (ICD10-I70.0). 6. Cirrhosis and splenomegaly. Electronically Signed   By: Abigail Miyamoto M.D.   On: 07/07/2017 10:21   Dg  Chest Port 1 View  Result Date: 07/19/2017 CLINICAL DATA:  68 year old female with altered mental status, unresponsive. EXAM: PORTABLE CHEST 1 VIEW COMPARISON:  Chest CT 07/07/2017, chest radiographs 06/16/2017, and earlier. FINDINGS: Portable AP upright view at 2320 hrs. Large body habitus. Mildly lower lung volumes. Mildly increased hypo ventilation at both lung bases, but no pneumothorax or  pleural effusion. No definite pulmonary edema. Stable cardiac size and mediastinal contours. Stable lower thoracic spinal stimulator device and prior cervical ACDF. Paucity of bowel gas in the upper abdomen. IMPRESSION: Large body habitus with lower lung volumes and mildly increased lung base atelectasis compared to radiographs in January. Electronically Signed   By: Genevie Ann M.D.   On: 07/19/2017 23:46   Dg C-arm 1-60 Min-no Report  Result Date: 07/17/2017 Fluoroscopy was utilized by the requesting physician.  No radiographic interpretation.     CBC Recent Labs  Lab 07/19/17 2242 07/20/17 0343  WBC 7.8 7.3  HGB 12.8 11.8*  HCT 38.0 34.8*  PLT 124* 108*  MCV 93.8 94.1  MCH 31.5 31.9  MCHC 33.5 33.9  RDW 16.1* 16.0*  LYMPHSABS 1.2  --   MONOABS 0.8  --   EOSABS 0.1  --   BASOSABS 0.0  --     Chemistries  Recent Labs  Lab 07/19/17 2242 07/20/17 0343 07/21/17 0626  NA 142 143 144  K 3.5 3.5 4.1  CL 106 108 112*  CO2 27 27 26   GLUCOSE 170* 114* 152*  BUN 14 14 9   CREATININE 0.97 0.85 0.67  CALCIUM 8.8* 8.2* 8.8*  MG  --   --  2.1  AST 25  --   --   ALT 19  --   --   ALKPHOS 143*  --   --   BILITOT 0.7  --   --    ------------------------------------------------------------------------------------------------------------------ estimated creatinine clearance is 79 mL/min (by C-G formula based on SCr of 0.67 mg/dL). ------------------------------------------------------------------------------------------------------------------ No results for input(s): HGBA1C in the last 72 hours. ------------------------------------------------------------------------------------------------------------------ No results for input(s): CHOL, HDL, LDLCALC, TRIG, CHOLHDL, LDLDIRECT in the last 72 hours. ------------------------------------------------------------------------------------------------------------------ Recent Labs    07/19/17 2242  TSH 4.486    ------------------------------------------------------------------------------------------------------------------ No results for input(s): VITAMINB12, FOLATE, FERRITIN, TIBC, IRON, RETICCTPCT in the last 72 hours.  Coagulation profile No results for input(s): INR, PROTIME in the last 168 hours.  No results for input(s): DDIMER in the last 72 hours.  Cardiac Enzymes Recent Labs  Lab 07/19/17 2242  TROPONINI <0.03   ------------------------------------------------------------------------------------------------------------------ Invalid input(s): POCBNP    Assessment & Plan  Patient is a 67 year old white female admitted with acute encephalopathy 1.  Acute encephalopathy suspect due to UTI and sepsis, patient's likely has some psychiatric issue causing her not to open her eyes psych to see 2.  Sepsis due to UTI continue IV antibiotics we will follow urine cultures 3.  Hepatic encephalopathy  mild ammonia level minimally elevated: Continue lactulose 4.  Urinary tract infection present on admission follow urine cultures 5.  Asthma continue nebulizers and inhalers as needed 6.  Diabetes type 2 sliding scale insulin 7.  Restless leg syndrome continue Requip 8.  Hyperlipidemia resume simvastatin  9.  History of lower extremity edema hold Lasix for now 10.  Miscellaneous SCDs for DVT prophylaxis       Code Status Orders  (From admission, onward)        Start     Ordered   07/20/17 0303  Full code  Continuous  07/20/17 0302    Code Status History    Date Active Date Inactive Code Status Order ID Comments User Context   03/08/2017 01:55 03/11/2017 21:04 Full Code [id number]  Demetrios Loll, MD Inpatient   12/21/2016 11:56 12/22/2016 19:18 Full Code [id number]  Demetrios Loll, MD Inpatient   11/15/2016 16:10 11/15/2016 20:11 Full Code [id number]  Corky Mull, MD Inpatient   07/19/2016 14:54 07/21/2016 17:09 Full Code [id number]  Nicholes Mango, MD Inpatient   04/18/2016 13:11 04/21/2016  14:38 Full Code [id number]  Gladstone Lighter, MD ED   04/04/2016 15:31 04/05/2016 21:38 Full Code [id number]  Loletha Grayer, MD ED   09/10/2015 14:04 09/11/2015 18:33 Full Code [id number]  Yves Dill, RN Inpatient   09/09/2015 23:14 09/10/2015 14:04 DNR [id number]  Demetrios Loll, MD ED    Advance Directive Documentation     Most Recent Value  Type of Advance Directive  Living will  Pre-existing out of facility DNR order (yellow form or pink MOST form)  No data  "MOST" Form in Place?  No data           Consults intensivist  DVT Prophylaxis SCDs  Lab Results  Component Value Date   PLT 108 (L) 07/20/2017     Time Spent in minutes  7mn  Greater than 50% of time spent in care coordination and counseling patient regarding the condition and plan of care.   SDustin FlockM.D on 07/21/2017 at 3:06 PM  Between 7am to 6pm - Pager - [phone]  After 6pm go to [email or web address] - password [email or web address]   Office  [phone]

## 2017-07-21 NOTE — Progress Notes (Signed)
During the night, pt was more responsive.  She was able to tell me who she was, what month it was, even who the president was.  When I told her it was Valentine's day, she remarked that it was Feb. 14th.  She was confused as to where she was though.  It took her awhile to open her eyes however.  At first she was very tearful and stated that she was scared.  But as time went on, she began talking to me about playing Bingo and was following all commands.  Even laughed at a joke.  Pt was able to interact fairly well.  Now towards the morning, pt is more somnolent and tearful once again.  She refuses to open her eyes but will talk to me.

## 2017-07-21 NOTE — Progress Notes (Signed)
PULMONARY / CRITICAL CARE MEDICINE   Name: Stephanie [name] MRN: [id number] DOB: 01/26/1951    ADMISSION DATE:  07/19/2017   HISTORY OF PRESENT ILLNESS:   This is a 67 yo female with a PMH of TIA, OSA, Restless Leg Syndrome, Insomnia, Hyperlipidemia, Hepatitis B, CVA, GERD, Diabetes Mellitus, Fatty Liver, Depression, Bipolar Disorder, Asthma, Arthritis, Aortic Dissection, Anxiety, and Anemia.  She presented to [facility] ER via EMS from [facility] on 02/13 with altered mental status.  Per ER notes the pt developed AMS with slurred speech around 1900 on 12/13, and was administered medications at that time by med tech at nursing facility. She was last seen at baseline at 1700 on 02/13 when she ambulated to dinner. Due to worsening mentation EMS was notified and upon arrival the pt was unresponsive with a respiratory rate of 10 breaths per min,  O2 sats 95%, and CBG reading in the 200's.  She was placed on NRB, nasal trumpet inserted in right nare, and 1.5 mg narcan administered by EMS en route to ER with minimal improvement in mentation.  There was also concern of possible medication error at the nursing facility during administration of pts medications. In the ER she was hypothermic rectal temp 95.2 F, CXR revealed lung base atelectasis, CT Head negative, and UA positive for UTI.  Pt ruled in for sepsis she received iv fluids and abx, then subsequently admitted to the stepdown unit by hospitalist team for further workup and treatment.   She is being treated for UTI  REVIEW OF SYSTEMS:   Patient is more awake today; normally ambulates with walker  SUBJECTIVE:  Patient more awake but still intermittently refuses to speak.  VITAL SIGNS: BP 118/73   Pulse 89   Temp 97.9 F (36.6 C) (Oral)   Resp 16   Ht 5' 3"  (1.6 m)   Wt 225 lb (102.1 kg)   SpO2 96%   BMI 39.86 kg/m   HEMODYNAMICS:    VENTILATOR SETTINGS:    INTAKE / OUTPUT: I/O last 3 completed shifts: In: 2547.5 [I.V.:2097.5; IV  Piggyback:450] Out: 2655 [Urine:2655]  PHYSICAL EXAMINATION: General:  No acute distress Neuro:  oriented to self and place HEENT:  Moist mucous membranes Cardiovascular:  RRR Lungs:  CTA Abdomen:  soft Musculoskeletal:  No deformity Skin:  Small scab left lower leg, warm and dry  LABS:  BMET Recent Labs  Lab 07/19/17 2242 07/20/17 0343 07/21/17 0626  NA 142 143 144  K 3.5 3.5 4.1  CL 106 108 112*  CO2 27 27 26   BUN 14 14 9   CREATININE 0.97 0.85 0.67  GLUCOSE 170* 114* 152*    Electrolytes Recent Labs  Lab 07/19/17 2242 07/20/17 0343 07/21/17 0626  CALCIUM 8.8* 8.2* 8.8*  MG  --   --  2.1  PHOS  --   --  3.7    CBC Recent Labs  Lab 07/19/17 2242 07/20/17 0343  WBC 7.8 7.3  HGB 12.8 11.8*  HCT 38.0 34.8*  PLT 124* 108*    Coag's No results for input(s): APTT, INR in the last 168 hours.  Sepsis Markers Recent Labs  Lab 07/19/17 2242 07/20/17 0343 07/21/17 0626  LATICACIDVEN 1.2 1.1  --   PROCALCITON  --  <0.10 <0.10    ABG No results for input(s): PHART, PCO2ART, PO2ART in the last 168 hours.  Liver Enzymes Recent Labs  Lab 07/19/17 2242  AST 25  ALT 19  ALKPHOS 143*  BILITOT 0.7  ALBUMIN  3.2*    Cardiac Enzymes Recent Labs  Lab 07/19/17 2242  TROPONINI <0.03    Glucose Recent Labs  Lab 07/20/17 0735 07/20/17 1132 07/20/17 1715 07/20/17 2232 07/21/17 0728 07/21/17 1200  GLUCAP 94 148* 73 115* 157* 201*    Imaging No results found.   STUDIES: CT head and chest  CULTURES: Urine- > 100,000 CFU beta hemolytic organism  ANTIBIOTICS: Rocephin  SIGNIFICANT EVENTS: Intermittent confusion  LINES/TUBES: Peripheral IV  DISCUSSION: This 67 year old lady admitted with altered mental status.  She was found to have a UTI.  Her mentation has progressive improved although she has episodes of reported that she is afraid to open her eyes.  ASSESSMENT / PLAN: 1. Sepsis has resolved- BP remains stable 2. UTI- ID and  sensi still pending 3. 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 Hx of Cirrhosis with normal ammonia levels.  PLAN: 1. Continue on ABX pending ID and sensi 2. D?C foley as soon as able 3. Start Hydrocortisone 4. Target glucose monitoring 5. Encourage oral intake 6. DVT prophylaxis 7. Change to D5LR as chloride is elevated and D/C when taking adequate intake.   Cammie Sickle, M.D Pulmonary and [place] Pager: [phone]  07/21/2017, 1:43 PM

## 2017-07-22 LAB — GLUCOSE, CAPILLARY
Glucose-Capillary: 211 mg/dL — ABNORMAL HIGH (ref 65–99)
Glucose-Capillary: 272 mg/dL — ABNORMAL HIGH (ref 65–99)

## 2017-07-22 LAB — PROCALCITONIN: Procalcitonin: <0.1 ng/mL

## 2017-07-22 MED ORDER — AMOXICILLIN-POT CLAVULANATE 875-125 MG PO TABS
1.0000 | ORAL_TABLET | Freq: Two times a day (BID) | ORAL | Status: DC
  Administered 2017-07-22: 1 via ORAL
  Filled 2017-07-22: qty 1

## 2017-07-22 MED ORDER — AMOXICILLIN-POT CLAVULANATE 875-125 MG PO TABS: 1.0000 | ORAL_TABLET | Freq: Two times a day (BID) | ORAL | 0 refills | Status: DC

## 2017-07-22 NOTE — Progress Notes (Signed)
Patient ready to be discharged. AVS printed and instructions given to patient and friend. Friend asked a lot of questions and clarity was given. The patients clothing was missing. Contacted ED and ICU. Neither department knew of their whereabouts. Last documented in ICU. Patient family member brought clothes. Patient IV removed, dressed and belongings gathered. Wheeled to ride by NT.

## 2017-07-22 NOTE — Clinical Social Work Note (Signed)
The patient will discharge to the [facility] via family transport today. The patient, her family, and the facility are aware and in agreement. CSW plan is to deliver the discharge packet to the patient's chart as soon as possible. CSW will sign off after that delivery. Please consult should additional needs arise.  Santiago Bumpers, MSW, Latanya Presser [phone]

## 2017-07-22 NOTE — Discharge Instructions (Signed)
Antibiotic Medicine, Adult Antibiotic medicines treat infections caused by a type of germ called bacteria. They work by killing the bacteria that make you sick. When do I need to take antibiotics? You often need these medicines to treat bacterial infections, such as:  A urinary tract infection (UTI).  Strep throat.  Meningitis. This affects the spinal cord and brain.  A bad lung infection.  You may start the medicines while your doctor waits for tests to come back. When the tests come back, your doctor may change or stop your medicine. When are antibiotics not needed? You do not need these medicines for most common illnesses, such as:  A cold.  The flu.  A sore throat.  Antibiotics are not always needed for all infections caused by bacteria. Do not ask for these medicines, or take them, when they are not needed. What are the risks of taking antibiotics? Most antibiotics can cause an infection called Clostridium difficile.This causes watery poop (diarrhea). Let your doctor know right away if:  You have watery poop while taking an antibiotic.  You have watery poop after you stop taking an antibiotic. The illness can happen weeks after you stop the medicine.  You also have a risk of getting an infection in the future that antibiotics cannot treat (antibiotic-resistant infection). This type of infection can be dangerous. What else should I know about taking antibiotics?  You need to take the entire prescription. ? Take the medicine for as long as told by your doctor. ? Do not stop taking it even if you start to feel better.  Try not to miss any doses. If you miss a dose, call your doctor.  Birth control pills may not work. If you take birth control pills: ? Keep on taking them. ? Use a second form of birth control, such as a condom. Do this for as long as told by your doctor.  Ask your doctor: ? How long to wait in between doses. ? If you should take the medicine with  food. ? If there is anything you should stay away from while taking the antibiotic, such as: ? Food. ? Drinks. ? Medicines. ? If there are any side effects you should watch for.  Only take the medicines that your doctor told you to take. Do not take medicines that were given to someone else.  Drink a large glass of water with the medicine.  Ask the pharmacist for a tool to measure the medicine, such as: ? A syringe. ? A cup. ? A spoon.  Throw away any extra medicine. Contact a doctor if:  You get worse.  You have new joint pain or muscle aches after starting the medicine.  You have side effects from the medicine, such as: ? Stomach pain. ? Watery poop. ? Feeling sick to your stomach (nausea). Get help right away if:  You have signs of a very bad allergic reaction. If this happens, stop taking the medicine right away. Signs may include: ? Hives. These are raised, itchy, red bumps on the skin. ? Skin rash. ? Trouble breathing. ? Wheezing. ? Swelling. ? Feeling dizzy. ? Throwing up (vomiting).  Your pee (urine) is dark, or is the color of blood.  Your skin turns yellow.  You bruise easily.  You bleed easily.  You have very bad watery poop and cramps in your belly.  You have a very bad headache. Summary  Antibiotics are often used to treat infections caused by bacteria.  Only take these  medicines when needed.  Let your doctor know if you have watery poop while taking an antibiotic.  You need to take the entire prescription. This information is not intended to replace advice given to you by your health care provider. Make sure you discuss any questions you have with your health care provider. Document Released: 03/01/2008 Document Revised: 05/25/2016 Document Reviewed: 05/25/2016 Elsevier Interactive Patient Education  2017 [employer].

## 2017-07-22 NOTE — Discharge Summary (Signed)
Stephanie [name] at [place] NAME: Stephanie [name]    MR#:  [id number]  DATE OF BIRTH:  May 20, 1951  DATE OF ADMISSION:  07/19/2017 ADMITTING PHYSICIAN: Saundra Shelling, MD  DATE OF DISCHARGE: 07/22/2017  PRIMARY CARE PHYSICIAN: Sarajane Jews, MD    ADMISSION DIAGNOSIS:  Lower urinary tract infectious disease [N39.0] Altered mental status, unspecified altered mental status type [R41.82] Sepsis ([place]) [A41.9]  DISCHARGE DIAGNOSIS:  Acute metabolic encephalopathy Group B strep UTI    SECONDARY DIAGNOSIS:   Past Medical History:  Diagnosis Date  . Adenomatous colon polyp 08/27/2014   Polyps x 3  . Anemia   . Anxiety   . 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 Arthritis   . Asthma   . Bipolar affective disorder ([place])    h/o  . Blood transfusion 2000  . Blood transfusion without reported diagnosis   . Cancer ([place])   . Chronic abdominal pain    abdominal wall pain  . 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 Colon polyp   . Depression   . Diabetes mellitus without complication ([place])   . Fatty liver 04/09/08   found in abd CT  . GERD (gastroesophageal reflux disease)   . H/O: CVA (cardiovascular accident)    TIA  . H/O: rheumatic fever   . Hepatitis    HEP "B" twenty years ago  . Hyperlipidemia   . Incontinence   . Insomnia   . Lower extremity edema   . Obstructive sleep apnea    Use C-PAP twice per week  . Platelets decreased ([place])   . RLS (restless legs syndrome)   . Shortness of breath   . Sleep apnea   . Stroke [facility])    TIA- 2002  . Ulcer   . Unspecified disorders of nervous system     HOSPITAL COURSE:   67 y/o female with liver cirrhosis, Diabetes and anxiety presented with AMS.  1. Acute encephalopathy, metabolic This was due to mild hepatic encephalopathy as well as urinary tract infection Patient is back to baseline. She was also value by psychiatry who did not feel that this was related to underlying psychiatric  disorder.  2. Mild hepatic encephalopathy: Patient symptoms have resolved  3. due to Group B streptococcus UTI: Patient will continue on Augmentin Sepsis is ruled out   4. Diabetes: Patient may resume outpatient medications with ADA diet  5. Hyperlipidemia: Continue statin  6. Mild intermittent asthma without exacerbation  Physical therapy and recommended skilled nursing facility however patient would like to go home with home health at the [facility] DIET:   Stable for discharge on diabetic diet  CONSULTS OBTAINED:  Treatment Team:  Pccm, Ander Gaster, MD Clapacs, Madie Reno, MD  DRUG ALLERGIES:   Allergies  Allergen Reactions  . Doxycycline Hives, Swelling, Other (See Comments) and Nausea Only    Reaction:  Facial swelling  Face red and swollen; no difficulty breathing    DISCHARGE MEDICATIONS:   Allergies as of 07/22/2017      Reactions   Doxycycline Hives, Swelling, Other (See Comments), Nausea Only   Reaction:  Facial swelling  Face red and swollen; no difficulty breathing      Medication List    STOP taking these medications   cyclobenzaprine 10 MG tablet Commonly known as:  FLEXERIL   lidocaine 5 % Commonly known as:  LIDODERM   mirtazapine 45 MG tablet Commonly known  as:  REMERON   oxyCODONE 5 MG immediate release tablet Commonly known as:  ROXICODONE   potassium chloride SA 20 MEQ tablet Commonly known as:  K-DUR,KLOR-CON   traMADol 50 MG tablet Commonly known as:  ULTRAM     TAKE these medications   albuterol (2.5 MG/3ML) 0.083% nebulizer solution Commonly known as:  PROVENTIL Take 2.5 mg by nebulization every 6 (six) hours as needed for wheezing or shortness of breath.   albuterol 108 (90 Base) MCG/ACT inhaler Commonly known as:  PROVENTIL HFA;VENTOLIN HFA Inhale 2 puffs into the lungs every 6 (six) hours as needed for wheezing or shortness of breath.   amoxicillin-clavulanate 875-125 MG tablet Commonly known as:   AUGMENTIN Take 1 tablet by mouth every 12 (twelve) hours for 4 days.   ANTACID 200-200-20 MG/5ML suspension Generic drug:  alum & mag hydroxide-simeth Take 30 mLs by mouth every 4 (four) hours as needed for indigestion or heartburn.   budesonide-formoterol 160-4.5 MCG/ACT inhaler Commonly known as:  SYMBICORT Inhale 2 puffs into the lungs 2 (two) times daily. (0800 & 2000)   buPROPion 150 MG 24 hr tablet Commonly known as:  WELLBUTRIN XL Take 150 mg by mouth daily.   clonazePAM 0.25 MG disintegrating tablet Commonly known as:  KLONOPIN Take 0.25 mg by mouth 3 (three) times daily.   diclofenac sodium 1 % Gel Commonly known as:  VOLTAREN Apply 2 g topically every 8 (eight) hours as needed.   dicyclomine 10 MG capsule Commonly known as:  BENTYL Take 10 mg by mouth 3 (three) times daily. (0800, 1300 & 2000)   EUCERIN INTENSIVE REPAIR Lotn Apply 1 application topically 2 (two) times daily.   ferrous sulfate 325 (65 FE) MG tablet Take 325 mg by mouth 2 (two) times daily. (0800 & 2000)   furosemide 80 MG tablet Commonly known as:  LASIX Take 80 mg by mouth daily. (0800)   gabapentin 600 MG tablet Commonly known as:  NEURONTIN Take 600 mg by mouth 2 (two) times daily. (0800 and 2000)   GOLD BOND EX Apply 1 application topically daily.   HUMALOG KWIKPEN 100 UNIT/ML KiwkPen Generic drug:  insulin lispro Inject 0-12 Units into the skin 3 (three) times daily. Check FSBS before each meal & cover with SSI (sliding scale insulin)   insulin glargine 100 UNIT/ML injection Commonly known as:  LANTUS Inject 28 Units into the skin at bedtime.   lactulose 10 GM/15ML solution Commonly known as:  CHRONULAC Take 10 g by mouth daily. (0800)   lamoTRIgine 100 MG tablet Commonly known as:  LAMICTAL Take 100 mg by mouth 2 (two) times daily. (0800 & 2000)   LYRICA 150 MG capsule Generic drug:  pregabalin Take 150 mg by mouth 3 (three) times daily. (0800, 1400 & 2000)   magnesium  hydroxide 400 MG/5ML suspension Commonly known as:  MILK OF MAGNESIA Take 30 mLs by mouth daily as needed (for constipation).   metoCLOPramide 5 MG tablet Commonly known as:  REGLAN Take 5 mg by mouth 4 (four) times daily -  before meals and at bedtime. (0800, 1200, 1600 & 2000)   omeprazole 20 MG capsule Commonly known as:  PRILOSEC Take 20 mg by mouth daily.   ondansetron 4 MG tablet Commonly known as:  ZOFRAN Take 4 mg by mouth every 4 (four) hours as needed for nausea or vomiting.   oxybutynin 5 MG 24 hr tablet Commonly known as:  DITROPAN-XL Take 5 mg by mouth daily. (0800)   rOPINIRole  3 MG tablet Commonly known as:  REQUIP Take 3 mg by mouth at bedtime. (2000)   simvastatin 10 MG tablet Commonly known as:  ZOCOR Take 10 mg by mouth at bedtime. (2000)   spironolactone 25 MG tablet Commonly known as:  ALDACTONE Take 25 mg by mouth daily. (0800)   vitamin C 500 MG tablet Commonly known as:  ASCORBIC ACID Take 500 mg by mouth 2 (two) times daily. (0800 & 2000)   zolpidem 5 MG tablet Commonly known as:  AMBIEN Take 5 mg by mouth at bedtime. (2000)         Today   CHIEF COMPLAINT:  I am ready to go home  No acute events reported   VITAL SIGNS:  Blood pressure 111/75, pulse 85, temperature 98 F (36.7 C), temperature source Oral, resp. rate 20, height 5' 3"  (1.6 m), weight 102.1 kg (225 lb), SpO2 98 %.   REVIEW OF SYSTEMS:  Review of Systems  Constitutional: Negative.  Negative for chills, fever and malaise/fatigue.  HENT: Negative.  Negative for ear discharge, ear pain, hearing loss, nosebleeds and sore throat.   Eyes: Negative.  Negative for blurred vision and pain.  Respiratory: Negative.  Negative for cough, hemoptysis, shortness of breath and wheezing.   Cardiovascular: Negative.  Negative for chest pain, palpitations and leg swelling.  Gastrointestinal: Negative.  Negative for abdominal pain, blood in stool, diarrhea, nausea and vomiting.   Genitourinary: Negative.  Negative for dysuria.  Musculoskeletal: Negative.  Negative for back pain.  Skin: Negative.   Neurological: Negative for dizziness, tremors, speech change, focal weakness, seizures and headaches.  Endo/Heme/Allergies: Negative.  Does not bruise/bleed easily.  Psychiatric/Behavioral: Negative.  Negative for depression, hallucinations and suicidal ideas.     PHYSICAL EXAMINATION:  GENERAL:  67 y.o.-year-old patient lying in the bed with no acute distress.  NECK:  Supple, no jugular venous distention. No thyroid enlargement, no tenderness.  LUNGS: Normal breath sounds bilaterally, no wheezing, rales,rhonchi  No use of accessory muscles of respiration.  CARDIOVASCULAR: S1, S2 normal. No murmurs, rubs, or gallops.  ABDOMEN: Soft, non-tender, non-distended. Bowel sounds present. No organomegaly or mass.  EXTREMITIES: No pedal edema, cyanosis, or clubbing.  PSYCHIATRIC: The patient is alert and oriented x 3.  SKIN: No obvious rash, lesion, or ulcer.   DATA REVIEW:   CBC Recent Labs  Lab 07/20/17 0343  WBC 7.3  HGB 11.8*  HCT 34.8*  PLT 108*    Chemistries  Recent Labs  Lab 07/19/17 2242  07/21/17 0626  NA 142   < > 144  K 3.5   < > 4.1  CL 106   < > 112*  CO2 27   < > 26  GLUCOSE 170*   < > 152*  BUN 14   < > 9  CREATININE 0.97   < > 0.67  CALCIUM 8.8*   < > 8.8*  MG  --   --  2.1  AST 25  --   --   ALT 19  --   --   ALKPHOS 143*  --   --   BILITOT 0.7  --   --    < > = values in this interval not displayed.    Cardiac Enzymes Recent Labs  Lab 07/19/17 2242  TROPONINI <0.03    Microbiology Results  @MICRORSLT48 @  RADIOLOGY:  No results found.    Allergies as of 07/22/2017      Reactions   Doxycycline Hives, Swelling, Other (See Comments),  Nausea Only   Reaction:  Facial swelling  Face red and swollen; no difficulty breathing      Medication List    STOP taking these medications   cyclobenzaprine 10 MG tablet Commonly  known as:  FLEXERIL   lidocaine 5 % Commonly known as:  LIDODERM   mirtazapine 45 MG tablet Commonly known as:  REMERON   oxyCODONE 5 MG immediate release tablet Commonly known as:  ROXICODONE   potassium chloride SA 20 MEQ tablet Commonly known as:  K-DUR,KLOR-CON   traMADol 50 MG tablet Commonly known as:  ULTRAM     TAKE these medications   albuterol (2.5 MG/3ML) 0.083% nebulizer solution Commonly known as:  PROVENTIL Take 2.5 mg by nebulization every 6 (six) hours as needed for wheezing or shortness of breath.   albuterol 108 (90 Base) MCG/ACT inhaler Commonly known as:  PROVENTIL HFA;VENTOLIN HFA Inhale 2 puffs into the lungs every 6 (six) hours as needed for wheezing or shortness of breath.   amoxicillin-clavulanate 875-125 MG tablet Commonly known as:  AUGMENTIN Take 1 tablet by mouth every 12 (twelve) hours for 4 days.   ANTACID 200-200-20 MG/5ML suspension Generic drug:  alum & mag hydroxide-simeth Take 30 mLs by mouth every 4 (four) hours as needed for indigestion or heartburn.   budesonide-formoterol 160-4.5 MCG/ACT inhaler Commonly known as:  SYMBICORT Inhale 2 puffs into the lungs 2 (two) times daily. (0800 & 2000)   buPROPion 150 MG 24 hr tablet Commonly known as:  WELLBUTRIN XL Take 150 mg by mouth daily.   clonazePAM 0.25 MG disintegrating tablet Commonly known as:  KLONOPIN Take 0.25 mg by mouth 3 (three) times daily.   diclofenac sodium 1 % Gel Commonly known as:  VOLTAREN Apply 2 g topically every 8 (eight) hours as needed.   dicyclomine 10 MG capsule Commonly known as:  BENTYL Take 10 mg by mouth 3 (three) times daily. (0800, 1300 & 2000)   EUCERIN INTENSIVE REPAIR Lotn Apply 1 application topically 2 (two) times daily.   ferrous sulfate 325 (65 FE) MG tablet Take 325 mg by mouth 2 (two) times daily. (0800 & 2000)   furosemide 80 MG tablet Commonly known as:  LASIX Take 80 mg by mouth daily. (0800)   gabapentin 600 MG  tablet Commonly known as:  NEURONTIN Take 600 mg by mouth 2 (two) times daily. (0800 and 2000)   GOLD BOND EX Apply 1 application topically daily.   HUMALOG KWIKPEN 100 UNIT/ML KiwkPen Generic drug:  insulin lispro Inject 0-12 Units into the skin 3 (three) times daily. Check FSBS before each meal & cover with SSI (sliding scale insulin)   insulin glargine 100 UNIT/ML injection Commonly known as:  LANTUS Inject 28 Units into the skin at bedtime.   lactulose 10 GM/15ML solution Commonly known as:  CHRONULAC Take 10 g by mouth daily. (0800)   lamoTRIgine 100 MG tablet Commonly known as:  LAMICTAL Take 100 mg by mouth 2 (two) times daily. (0800 & 2000)   LYRICA 150 MG capsule Generic drug:  pregabalin Take 150 mg by mouth 3 (three) times daily. (0800, 1400 & 2000)   magnesium hydroxide 400 MG/5ML suspension Commonly known as:  MILK OF MAGNESIA Take 30 mLs by mouth daily as needed (for constipation).   metoCLOPramide 5 MG tablet Commonly known as:  REGLAN Take 5 mg by mouth 4 (four) times daily -  before meals and at bedtime. (0800, 1200, 1600 & 2000)   omeprazole 20 MG capsule Commonly  known as:  PRILOSEC Take 20 mg by mouth daily.   ondansetron 4 MG tablet Commonly known as:  ZOFRAN Take 4 mg by mouth every 4 (four) hours as needed for nausea or vomiting.   oxybutynin 5 MG 24 hr tablet Commonly known as:  DITROPAN-XL Take 5 mg by mouth daily. (0800)   rOPINIRole 3 MG tablet Commonly known as:  REQUIP Take 3 mg by mouth at bedtime. (2000)   simvastatin 10 MG tablet Commonly known as:  ZOCOR Take 10 mg by mouth at bedtime. (2000)   spironolactone 25 MG tablet Commonly known as:  ALDACTONE Take 25 mg by mouth daily. (0800)   vitamin C 500 MG tablet Commonly known as:  ASCORBIC ACID Take 500 mg by mouth 2 (two) times daily. (0800 & 2000)   zolpidem 5 MG tablet Commonly known as:  AMBIEN Take 5 mg by mouth at bedtime. (2000)          Management plans  discussed with the patient and she is in agreement. Stable for discharge the [facility]   Patient should follow up with pcp  CODE STATUS:     Code Status Orders  (From admission, onward)        Start     Ordered   07/20/17 0303  Full code  Continuous     07/20/17 0302    Code Status History    Date Active Date Inactive Code Status Order ID Comments User Context   03/08/2017 01:55 03/11/2017 21:04 Full Code [id number]  Demetrios Loll, MD Inpatient   12/21/2016 11:56 12/22/2016 19:18 Full Code [id number]  Demetrios Loll, MD Inpatient   11/15/2016 16:10 11/15/2016 20:11 Full Code [id number]  Corky Mull, MD Inpatient   07/19/2016 14:54 07/21/2016 17:09 Full Code [id number]  Nicholes Mango, MD Inpatient   04/18/2016 13:11 04/21/2016 14:38 Full Code [id number]  Gladstone Lighter, MD ED   04/04/2016 15:31 04/05/2016 21:38 Full Code [id number]  Loletha Grayer, MD ED   09/10/2015 14:04 09/11/2015 18:33 Full Code [id number]  Yves Dill, RN Inpatient   09/09/2015 23:14 09/10/2015 14:04 DNR [id number]  Demetrios Loll, MD ED    Advance Directive Documentation     Most Recent Value  Type of Advance Directive  Living will  Pre-existing out of facility DNR order (yellow form or pink MOST form)  No data  "MOST" Form in Place?  No data      TOTAL TIME TAKING CARE OF THIS PATIENT: 38 minutes.    Note: This dictation was prepared with Dragon dictation along with smaller phrase technology. Any transcriptional errors that result from this process are unintentional.  Aydian Dimmick M.D on 07/22/2017 at 10:40 AM  Between 7am to 6pm - Pager - [phone] After 6pm go to [email or web address] - password [email or web address] [facility]  Office  [phone]  CC: Primary care physician; Sarajane Jews, MD

## 2017-07-22 NOTE — NC FL2 (Signed)
[place] LEVEL OF CARE SCREENING TOOL     IDENTIFICATION  Patient Name: Stephanie [name] Birthdate: Nov 29, 1950 Sex: female Admission Date (Current Location): 07/19/2017  [place] and [place] Number:  [name] [id number] [place] and Address:  [facility], [address] [place], [place] [address]      Provider Number: [id number]  Attending Physician Name and Address:  Bettey Costa, MD  Relative Name and Phone Number:  [name] [phone]  [phone]     Current Level of Care: Hospital Recommended Level of Care: [place] Prior Approval Number:    Date Approved/Denied:   PASRR Number:    Discharge Plan: Domiciliary (Rest home)([facility])    Current Diagnoses: Patient Active Problem List   Diagnosis Date Noted  . Sepsis ([place]) 07/20/2017  . Elevated alkaline phosphatase level 04/16/2017  . Altered mental status 03/07/2017  . Degenerative tear of lateral meniscus of right knee 11/15/2016  . Encephalopathy 07/19/2016  . Degenerative tear of medial meniscus of right knee 07/01/2016  . Primary osteoarthritis of right knee 07/01/2016  . Diabetes mellitus type 1, uncontrolled, without complications ([place]) 80/99/8338  . Lymphedema 06/14/2016  . Pain in limb 05/06/2016  . Swelling of limb 05/06/2016  . Postphlebitic syndrome with inflammation 05/06/2016  . Pneumonia 04/18/2016  . Chest pain 04/04/2016  . Iron deficiency anemia 01/30/2016  . Bilateral carotid artery stenosis 01/12/2016  . Hypoglycemia 09/09/2015  . Hyperglycemia 07/16/2015  . Neuropathy 06/08/2015  . Thrombocytopenia ([place]) 08/19/2014  . Abdominal pain, chronic, epigastric 06/12/2014  . Liver cirrhosis secondary to NASH ([place]) 02/27/2014  . Dizziness and giddiness 10/30/2013  . DOE (dyspnea on exertion) 10/30/2013  . Poorly controlled type 2 diabetes mellitus with complication ([place]) 25/10/3974  . DNR (do not resuscitate) 07/29/2013  .  Encounter for routine gynecological examination 07/29/2013  . Dyspnea 07/10/2013  . Lung nodule 07/10/2013  . Varicose veins of left lower extremity 10/09/2012  . GERD (gastroesophageal reflux disease) 10/09/2012  . Lower extremity edema 02/02/2012  . Cirrhosis of liver ([place]) 02/02/2012  . Anxiety 07/11/2011  . Aortic dissection ([place])   . BACK PAIN, LUMBAR, CHRONIC 11/19/2009  . UNS ADVRS EFF OTH RX MEDICINAL&BIOLOGICAL SBSTNC 12/16/2008  . UNSPECIFIED VITAMIN D DEFICIENCY 09/26/2008  . TOBACCO ABUSE 06/19/2008  . RESTLESS LEGS SYNDROME 04/27/2007  . INSOMNIA 04/27/2007  . HLD (hyperlipidemia) 03/08/2007  . OSA (obstructive sleep apnea) 03/08/2007  . Essential hypertension 03/08/2007  . Asthma 03/08/2007  . BIPOLAR AFFECTIVE DISORDER, HX OF 03/08/2007    Orientation RESPIRATION BLADDER Height & Weight     Self, Time, Situation, Place  Normal Continent Weight: 225 lb (102.1 kg) Height:  5' 3"  (160 cm)  BEHAVIORAL SYMPTOMS/MOOD NEUROLOGICAL BOWEL NUTRITION STATUS      Continent Diet(Cardiac diet)  AMBULATORY STATUS COMMUNICATION OF NEEDS Skin   Limited Assist   Normal                       Personal Care Assistance Level of Assistance  Bathing, Feeding, Dressing Bathing Assistance: Limited assistance Feeding assistance: Limited assistance Dressing Assistance: Limited assistance     Functional Limitations Info  Sight, Hearing, Speech Sight Info: Adequate Hearing Info: Adequate Speech Info: Adequate    SPECIAL CARE FACTORS FREQUENCY  PT (By licensed PT)     PT Frequency: Minimum 2x a week              Contractures Contractures Info: Not present    Additional  Factors Info  Code Status, Allergies, Insulin Sliding Scale, Psychotropic, Isolation Precautions Code Status Info: Full Code Allergies Info: Doxycycline Psychotropic Info: buPROPion (WELLBUTRIN XL) 24 hr tablet 150 mg  Insulin Sliding Scale Info: insulin aspart (novoLOG) injection 0-15 Units 3x a  day with meals. Isolation Precautions Info: MRSA and ESBL     Current Medications (07/22/2017):  This is the current hospital active medication list Current Facility-Administered Medications  Medication Dose Route Frequency Provider Last Rate Last Dose  . acetaminophen (TYLENOL) tablet 650 mg  650 mg Oral Q6H PRN Saundra Shelling, MD   650 mg at 07/21/17 1234   Or  . acetaminophen (TYLENOL) suppository 650 mg  650 mg Rectal Q6H PRN Pyreddy, Reatha Harps, MD      . albuterol (PROVENTIL) (2.5 MG/3ML) 0.083% nebulizer solution 2.5 mg  2.5 mg Inhalation Q6H PRN Pyreddy, Reatha Harps, MD      . amoxicillin-clavulanate (AUGMENTIN) 875-125 MG per tablet 1 tablet  1 tablet Oral Q12H Mody, Sital, MD      . aspirin tablet 325 mg  325 mg Oral Daily Pyreddy, Pavan, MD   325 mg at 07/22/17 1041  . atorvastatin (LIPITOR) tablet 40 mg  40 mg Oral q1800 Saundra Shelling, MD   40 mg at 07/21/17 1715  . buPROPion (WELLBUTRIN XL) 24 hr tablet 150 mg  150 mg Oral Daily Dustin Flock, MD   150 mg at 07/22/17 1041  . clonazePAM (KLONOPIN) tablet 0.5 mg  0.5 mg Oral BID PRN Clapacs, Madie Reno, MD   0.5 mg at 07/21/17 2229  . dextrose 5 % in lactated ringers infusion   Intravenous Continuous Lafayette Dragon, MD 50 mL/hr at 07/21/17 1544    . enoxaparin (LOVENOX) injection 40 mg  40 mg Subcutaneous Q24H Saundra Shelling, MD   40 mg at 07/22/17 0324  . ferrous sulfate tablet 325 mg  325 mg Oral BID Saundra Shelling, MD   325 mg at 07/22/17 1041  . hydrocortisone sodium succinate (SOLU-CORTEF) 100 MG injection 75 mg  75 mg Intravenous Q8H Lafayette Dragon, MD   75 mg at 07/22/17 0536  . insulin aspart (novoLOG) injection 0-15 Units  0-15 Units Subcutaneous TID WC Lafayette Dragon, MD   8 Units at 07/22/17 1043  . insulin aspart (novoLOG) injection 0-5 Units  0-5 Units Subcutaneous QHS Lafayette Dragon, MD   2 Units at 07/21/17 2214  . lactulose (CHRONULAC) 10 GM/15ML solution 30 g  30 g Oral TID Saundra Shelling, MD   30 g at 07/21/17 1052   . lamoTRIgine (LAMICTAL) tablet 100 mg  100 mg Oral BID Saundra Shelling, MD   100 mg at 07/22/17 1041  . mometasone-formoterol (DULERA) 100-5 MCG/ACT inhaler 2 puff  2 puff Inhalation BID Lafayette Dragon, MD   2 puff at 07/22/17 1042  . ondansetron (ZOFRAN) tablet 4 mg  4 mg Oral Q6H PRN Saundra Shelling, MD       Or  . ondansetron (ZOFRAN) injection 4 mg  4 mg Intravenous Q6H PRN Pyreddy, Reatha Harps, MD      . oxybutynin (DITROPAN-XL) 24 hr tablet 5 mg  5 mg Oral Daily Pyreddy, Pavan, MD   5 mg at 07/22/17 1041  . polymixin-bacitracin (POLYSPORIN) ointment   Topical TID Saundra Shelling, MD      . rOPINIRole (REQUIP) tablet 3 mg  3 mg Oral QHS Saundra Shelling, MD   3 mg at 07/21/17 2006     Discharge Medications: STOP taking these medications   cyclobenzaprine  10 MG tablet Commonly known as:  FLEXERIL   lidocaine 5 % Commonly known as:  LIDODERM   mirtazapine 45 MG tablet Commonly known as:  REMERON   oxyCODONE 5 MG immediate release tablet Commonly known as:  ROXICODONE   potassium chloride SA 20 MEQ tablet Commonly known as:  K-DUR,KLOR-CON   traMADol 50 MG tablet Commonly known as:  ULTRAM     TAKE these medications   albuterol (2.5 MG/3ML) 0.083% nebulizer solution Commonly known as:  PROVENTIL Take 2.5 mg by nebulization every 6 (six) hours as needed for wheezing or shortness of breath.   albuterol 108 (90 Base) MCG/ACT inhaler Commonly known as:  PROVENTIL HFA;VENTOLIN HFA Inhale 2 puffs into the lungs every 6 (six) hours as needed for wheezing or shortness of breath.   amoxicillin-clavulanate 875-125 MG tablet Commonly known as:  AUGMENTIN Take 1 tablet by mouth every 12 (twelve) hours for 4 days.   ANTACID 200-200-20 MG/5ML suspension Generic drug:  alum & mag hydroxide-simeth Take 30 mLs by mouth every 4 (four) hours as needed for indigestion or heartburn.   budesonide-formoterol 160-4.5 MCG/ACT inhaler Commonly known as:  SYMBICORT Inhale 2 puffs into  the lungs 2 (two) times daily. (0800 & 2000)   buPROPion 150 MG 24 hr tablet Commonly known as:  WELLBUTRIN XL Take 150 mg by mouth daily.   clonazePAM 0.25 MG disintegrating tablet Commonly known as:  KLONOPIN Take 0.25 mg by mouth 3 (three) times daily.   diclofenac sodium 1 % Gel Commonly known as:  VOLTAREN Apply 2 g topically every 8 (eight) hours as needed.   dicyclomine 10 MG capsule Commonly known as:  BENTYL Take 10 mg by mouth 3 (three) times daily. (0800, 1300 & 2000)   EUCERIN INTENSIVE REPAIR Lotn Apply 1 application topically 2 (two) times daily.   ferrous sulfate 325 (65 FE) MG tablet Take 325 mg by mouth 2 (two) times daily. (0800 & 2000)   furosemide 80 MG tablet Commonly known as:  LASIX Take 80 mg by mouth daily. (0800)   gabapentin 600 MG tablet Commonly known as:  NEURONTIN Take 600 mg by mouth 2 (two) times daily. (0800 and 2000)   GOLD BOND EX Apply 1 application topically daily.   HUMALOG KWIKPEN 100 UNIT/ML KiwkPen Generic drug:  insulin lispro Inject 0-12 Units into the skin 3 (three) times daily. Check FSBS before each meal & cover with SSI (sliding scale insulin)   insulin glargine 100 UNIT/ML injection Commonly known as:  LANTUS Inject 28 Units into the skin at bedtime.   lactulose 10 GM/15ML solution Commonly known as:  CHRONULAC Take 10 g by mouth daily. (0800)   lamoTRIgine 100 MG tablet Commonly known as:  LAMICTAL Take 100 mg by mouth 2 (two) times daily. (0800 & 2000)   LYRICA 150 MG capsule Generic drug:  pregabalin Take 150 mg by mouth 3 (three) times daily. (0800, 1400 & 2000)   magnesium hydroxide 400 MG/5ML suspension Commonly known as:  MILK OF MAGNESIA Take 30 mLs by mouth daily as needed (for constipation).   metoCLOPramide 5 MG tablet Commonly known as:  REGLAN Take 5 mg by mouth 4 (four) times daily -  before meals and at bedtime. (0800, 1200, 1600 & 2000)   omeprazole 20 MG capsule Commonly  known as:  PRILOSEC Take 20 mg by mouth daily.   ondansetron 4 MG tablet Commonly known as:  ZOFRAN Take 4 mg by mouth every 4 (four) hours as needed for  nausea or vomiting.   oxybutynin 5 MG 24 hr tablet Commonly known as:  DITROPAN-XL Take 5 mg by mouth daily. (0800)   rOPINIRole 3 MG tablet Commonly known as:  REQUIP Take 3 mg by mouth at bedtime. (2000)   simvastatin 10 MG tablet Commonly known as:  ZOCOR Take 10 mg by mouth at bedtime. (2000)   spironolactone 25 MG tablet Commonly known as:  ALDACTONE Take 25 mg by mouth daily. (0800)   vitamin C 500 MG tablet Commonly known as:  ASCORBIC ACID Take 500 mg by mouth 2 (two) times daily. (0800 & 2000)   zolpidem 5 MG tablet Commonly known as:  AMBIEN Take 5 mg by mouth at bedtime. (2000)        Relevant Imaging Results:  Relevant Lab Results:   Additional Information SS: [id number]  Zettie Pho, LCSW

## 2017-07-23 LAB — CULTURE, BLOOD (ROUTINE X 2): SPECIAL REQUESTS: ADEQUATE

## 2017-07-24 ENCOUNTER — Other Ambulatory Visit: Payer: Self-pay | Admitting: Thoracic Surgery (Cardiothoracic Vascular Surgery)

## 2017-07-24 DIAGNOSIS — I7100 Dissection of unspecified site of aorta: Secondary | ICD-10-CM

## 2017-07-24 LAB — CULTURE, BLOOD (ROUTINE X 2): Culture: NO GROWTH

## 2017-07-25 ENCOUNTER — Encounter: Payer: Self-pay | Admitting: Thoracic Surgery (Cardiothoracic Vascular Surgery)

## 2017-07-25 ENCOUNTER — Ambulatory Visit (INDEPENDENT_AMBULATORY_CARE_PROVIDER_SITE_OTHER): Payer: Medicare Other | Admitting: Thoracic Surgery (Cardiothoracic Vascular Surgery)

## 2017-07-25 ENCOUNTER — Other Ambulatory Visit: Payer: Self-pay

## 2017-07-25 VITALS — BP 112/71 | HR 89 | Resp 18 | Ht 63.0 in | Wt 232.4 lb

## 2017-07-25 DIAGNOSIS — R911 Solitary pulmonary nodule: Secondary | ICD-10-CM | POA: Diagnosis not present

## 2017-07-25 NOTE — [place] Notes (Signed)
[address]       [place] [address]             [phone]     HPI: Ms. Canova returns for a one year follow-up  Autry Droege is a 67 year old woman with a past medical history significant for tobacco abuse, COPD, lung nodules, sleep apnea, cirrhosis, ascites, and bipolar disorder.  She had a long history of tobacco abuse for 50 years before quitting in 2014.  In 2011 she was found to have a small right apical lung nodule.  That was followed for several years.  At one point she was found to have an 8 mm right apical groundglass opacity.  That remained stable for 3 years.  She now is being followed due to her history of tobacco abuse.  I last saw her in January 2018.  She had no suspicious lesions on her CT.  The interim since that visit she has had multiple emergency room and hospital visits.  She was hospitalized earlier this month with a urinary tract infection and altered mental status.  She currently is in an assisted living facility.  Past Medical History:  Diagnosis Date  . Adenomatous colon polyp 08/27/2014   Polyps x 3  . Anemia   . Anxiety   . 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 Arthritis   . Asthma   . Bipolar affective disorder ([place])    h/o  . Blood transfusion 2000  . Blood transfusion without reported diagnosis   . Cancer ([place])   . Chronic abdominal pain    abdominal wall pain  . Cirrhosis ([place])    related to NASH  . Colon polyp   . Depression   . Diabetes mellitus without complication ([place])   . Fatty liver 04/09/08   found in abd CT  . GERD (gastroesophageal reflux disease)   . H/O: CVA (cardiovascular accident)    TIA  . H/O: rheumatic fever   . Hepatitis    HEP "B" twenty years ago  . Hyperlipidemia   . Incontinence   . Insomnia   . Lower extremity edema   . Obstructive sleep apnea    Use C-PAP twice per week  . Platelets decreased ([place])   . RLS (restless legs syndrome)   . Shortness of breath   . Sleep apnea   .  Stroke [facility])    TIA- 2002  . Ulcer   . Unspecified disorders of nervous system       Current Outpatient Medications  Medication Sig Dispense Refill  . budesonide-formoterol (SYMBICORT) 160-4.5 MCG/ACT inhaler Inhale 2 puffs into the lungs 2 (two) times daily. (0800 & 2000)    . clonazePAM (KLONOPIN) 0.25 MG disintegrating tablet Take 0.25 mg by mouth 3 (three) times daily.    Marland Kitchen dicyclomine (BENTYL) 10 MG capsule Take 10 mg by mouth 3 (three) times daily. (0800, 1300 & 2000)    . ferrous sulfate 325 (65 FE) MG tablet Take 325 mg by mouth 2 (two) times daily. (0800 & 2000)    . furosemide (LASIX) 80 MG tablet Take 80 mg by mouth daily. (0800)    . gabapentin (NEURONTIN) 600 MG tablet Take 600 mg by mouth 2 (two) times daily. (0800 and 2000)    . insulin lispro (HUMALOG KWIKPEN) 100 UNIT/ML KiwkPen Inject 0-12 Units into the skin 3 (three) times daily. Check FSBS before each meal & cover with SSI (sliding scale insulin)     .  lactulose (CHRONULAC) 10 GM/15ML solution Take 10 g by mouth daily. (0800)    . lamoTRIgine (LAMICTAL) 100 MG tablet Take 100 mg by mouth 2 (two) times daily. (0800 & 2000)    . LYRICA 150 MG capsule Take 150 mg by mouth 3 (three) times daily. (0800, 1400 & 2000)    . metoCLOPramide (REGLAN) 5 MG tablet Take 5 mg by mouth 4 (four) times daily -  before meals and at bedtime. (0800, 1200, 1600 & 2000)    . omeprazole (PRILOSEC) 20 MG capsule Take 20 mg by mouth daily.     Marland Kitchen oxybutynin (DITROPAN-XL) 5 MG 24 hr tablet Take 5 mg by mouth daily. (0800)    . rOPINIRole (REQUIP) 3 MG tablet Take 3 mg by mouth at bedtime. (2000)    . simvastatin (ZOCOR) 10 MG tablet Take 10 mg by mouth at bedtime. (2000)    . spironolactone (ALDACTONE) 25 MG tablet Take 25 mg by mouth daily. (0800)    . vitamin C (ASCORBIC ACID) 500 MG tablet Take 500 mg by mouth 2 (two) times daily. (0800 & 2000)    . zolpidem (AMBIEN) 5 MG tablet Take 5 mg by mouth at bedtime. (2000)     No current  facility-administered medications for this visit.     Physical Exam BP 112/71 (BP Location: Left Arm, Patient Position: Sitting, Cuff Size: Large)   Pulse 89   Resp 18   Ht 5' 3"  (1.6 m)   Wt 232 lb 6.4 oz (105.4 kg)   SpO2 98% Comment: RA  BMI 41.17 kg/m  Morbidly obese woman in no acute distress Frail-appearing, moves slowly with shuffling gait No cervical or subclavicular adenopathy Cardiac regular rate and rhythm Lungs diminished breath sounds at bases, no wheezing or rales  Diagnostic Tests: CT CHEST WITHOUT CONTRAST  TECHNIQUE: Multidetector CT imaging of the chest was performed following the standard protocol without IV contrast.  COMPARISON:  Plain films 06/16/2017.  CT 04/23/2017  FINDINGS: Cardiovascular: Aortic and branch vessel atherosclerosis. Tortuous thoracic aorta. Mild cardiomegaly, without pericardial effusion. Multivessel coronary artery atherosclerosis.  Mediastinum/Nodes: Small middle mediastinal nodes are not pathologic by size criteria. Hilar regions poorly evaluated without intravenous contrast.  Lungs/Pleura: No pleural fluid.  3 mm right upper lobe pulmonary nodule on image 46/series 5 is not readily apparent on 06/28/2016 exam, but is felt to be similar to 04/23/2017.  Mosaic attenuation throughout both lungs. This is favored to be related to mosaic perfusion. Hypoventilation bilaterally. No focal airspace disease.  Upper Abdomen: Marked cirrhosis. Incompletely imaged splenomegaly. Normal imaged portions of the stomach, adrenal glands, kidneys. Pancreatic fatty replacement.  Musculoskeletal: Dorsal spinal stimulator. Lower cervical spine fixation.  IMPRESSION: 1. Mild motion degradation. 2. Mosaic attenuation throughout both lungs, favored to be related to mosaic perfusion. Given low lung volumes and patient body habitus, favored to be related to air trapping. Vascular phenomenon, including chronic pulmonary embolism,  could look similar. 3. 3 mm right upper lobe pulmonary nodule, not readily apparent on exam of 1 year ago. No follow-up needed if patient is low-risk. Non-contrast chest CT can be considered in 12 months if patient is high-risk. This recommendation follows the consensus statement: Guidelines for Management of Incidental Pulmonary Nodules Detected on CT Images: From the Fleischner Society 2017; Radiology 2017; 284:228-243. 4. Age advanced coronary artery atherosclerosis. Recommend assessment of coronary risk factors and consideration of medical therapy. 5.  Aortic Atherosclerosis (ICD10-I70.0). 6. Cirrhosis and splenomegaly.   Electronically Signed   By: Marylyn Ishihara  Jobe Igo M.D.   On: 07/07/2017 10:21 I personally reviewed the CT images and concur with the findings noted above  Impression: Mrs. Hamza is a 67 year old woman with a history of tobacco abuse and multiple other medical problems who has in the past been followed for multiple lung nodules.  None of these ever progressed.  She currently is getting annual scans due to her history of tobacco abuse for a prolonged period of time.  She did quit smoking in 2014.  Her scan today shows a 40m right upper lobe lung nodule.  This bears follow-up with a CT scan in 1 year  Plan:  Return in 1 year with CT chest  SMelrose Nakayama MD [facility] ([phone]

## 2017-07-30 IMAGING — DX DG KNEE COMPLETE 4+V*R*
4 series · 4 of 4 positions shown · non-contrast
Comparison: 11/23/2016

CLINICAL DATA: Pt c/o right knee pain since arthroscopic surgery on
11-15-16. No injury since surgery.

EXAM:
RIGHT KNEE - COMPLETE 4+ VIEW

[knee ap]
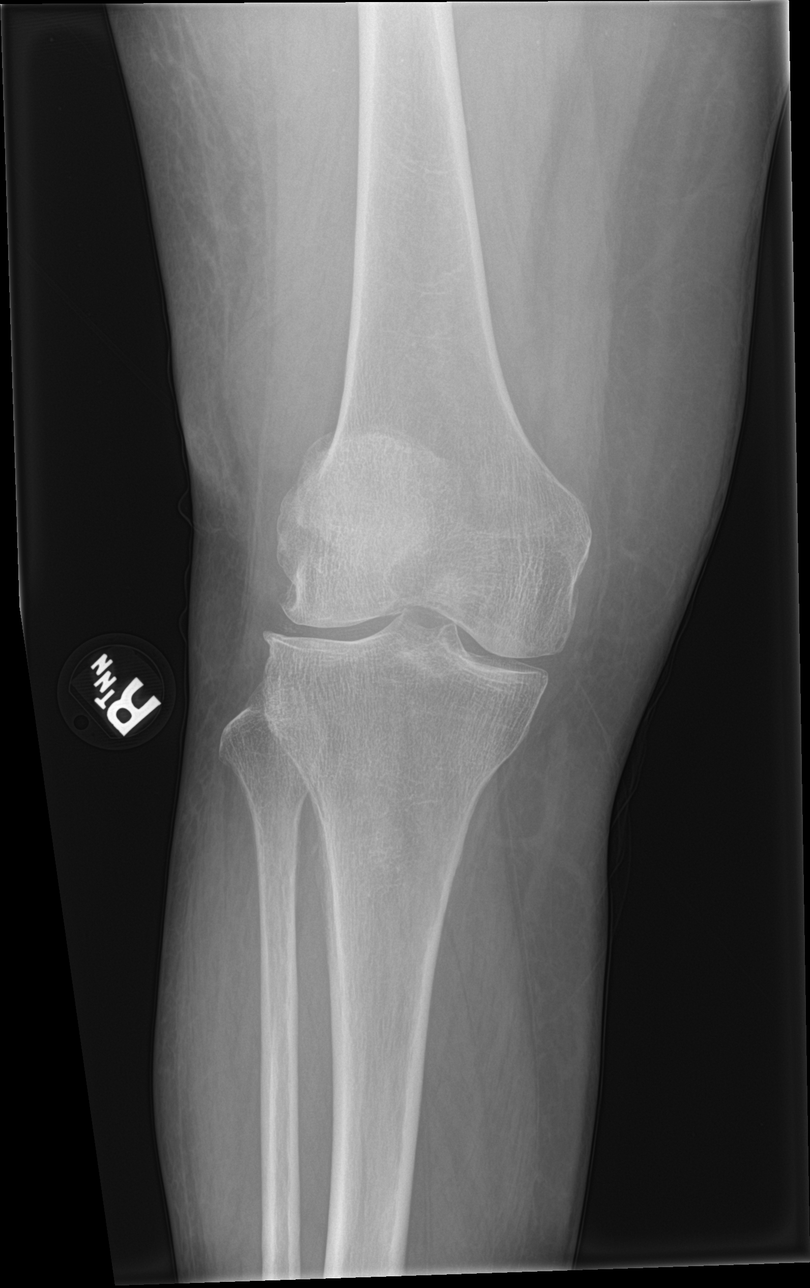

[knee lat]
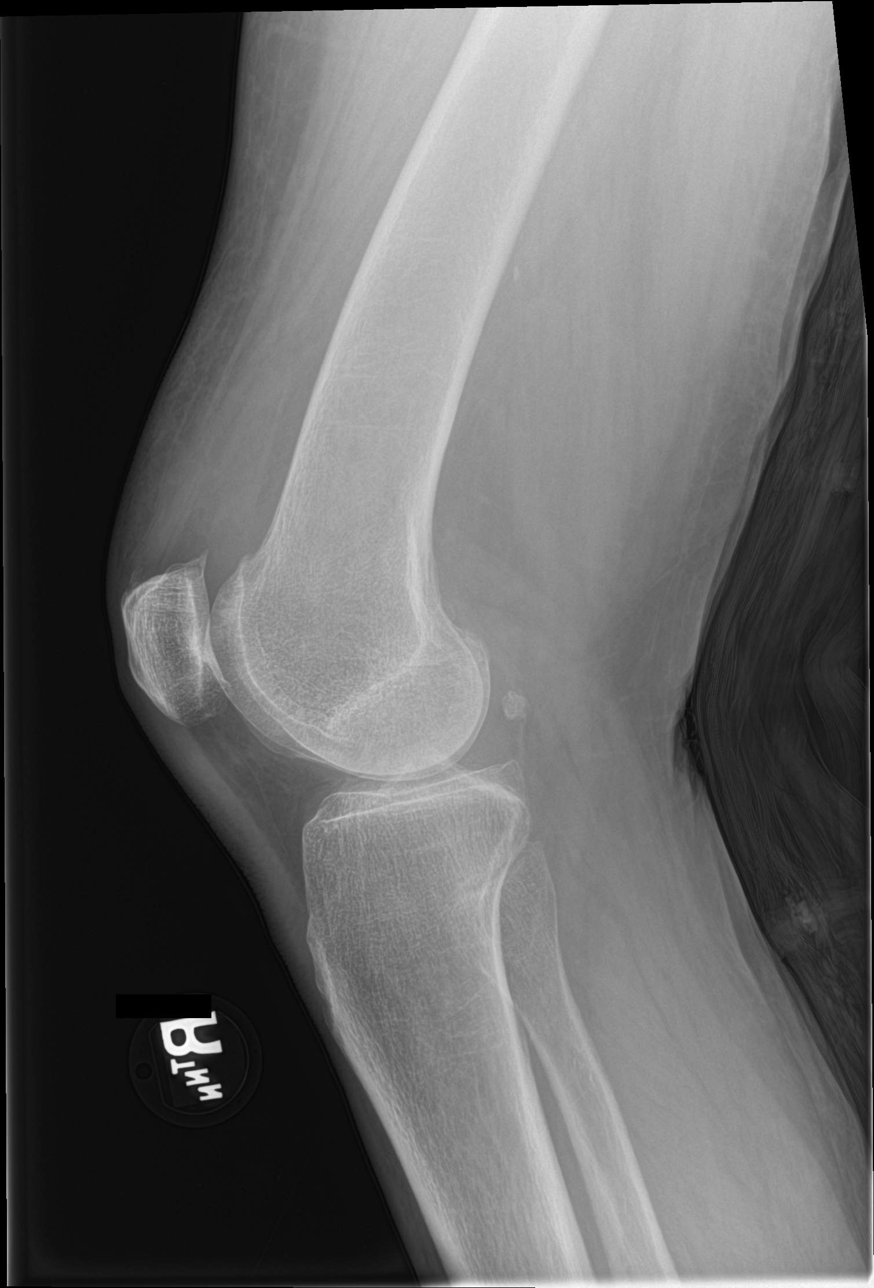

[knee obl (1 of 2)]
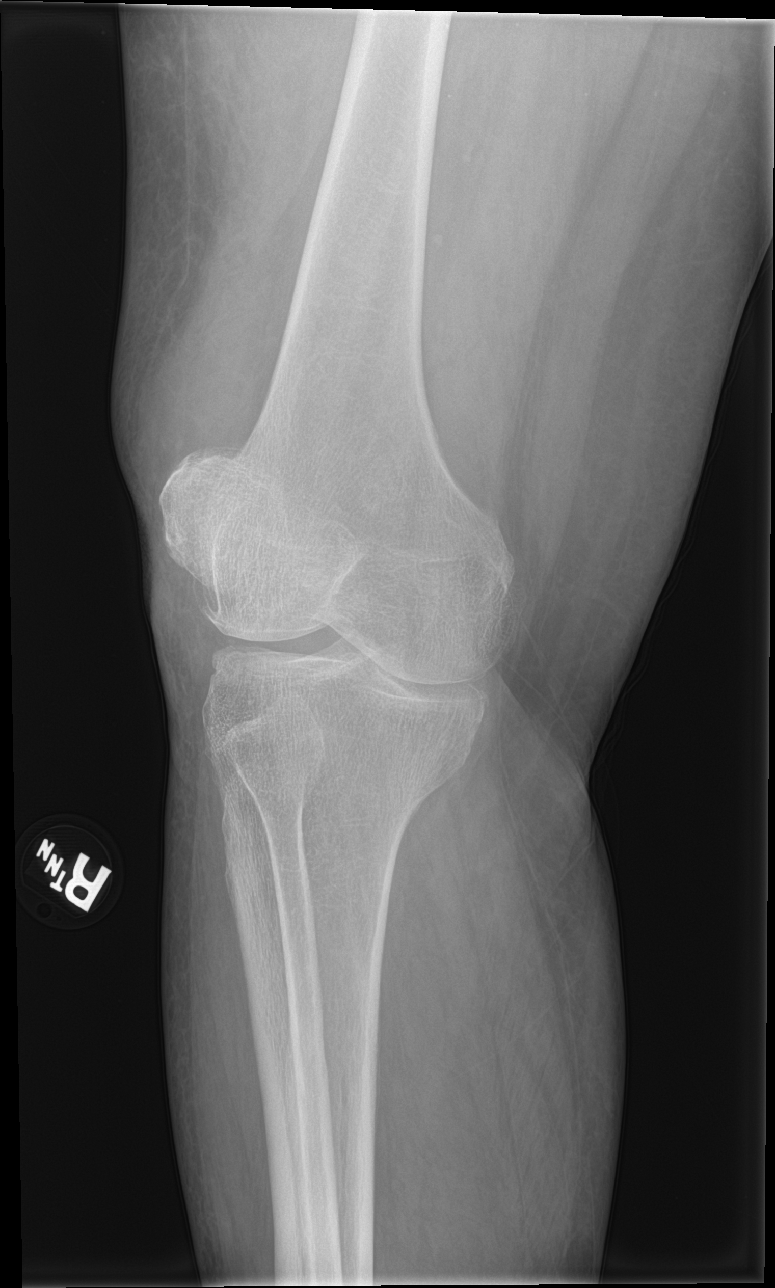

[knee obl (2 of 2)]
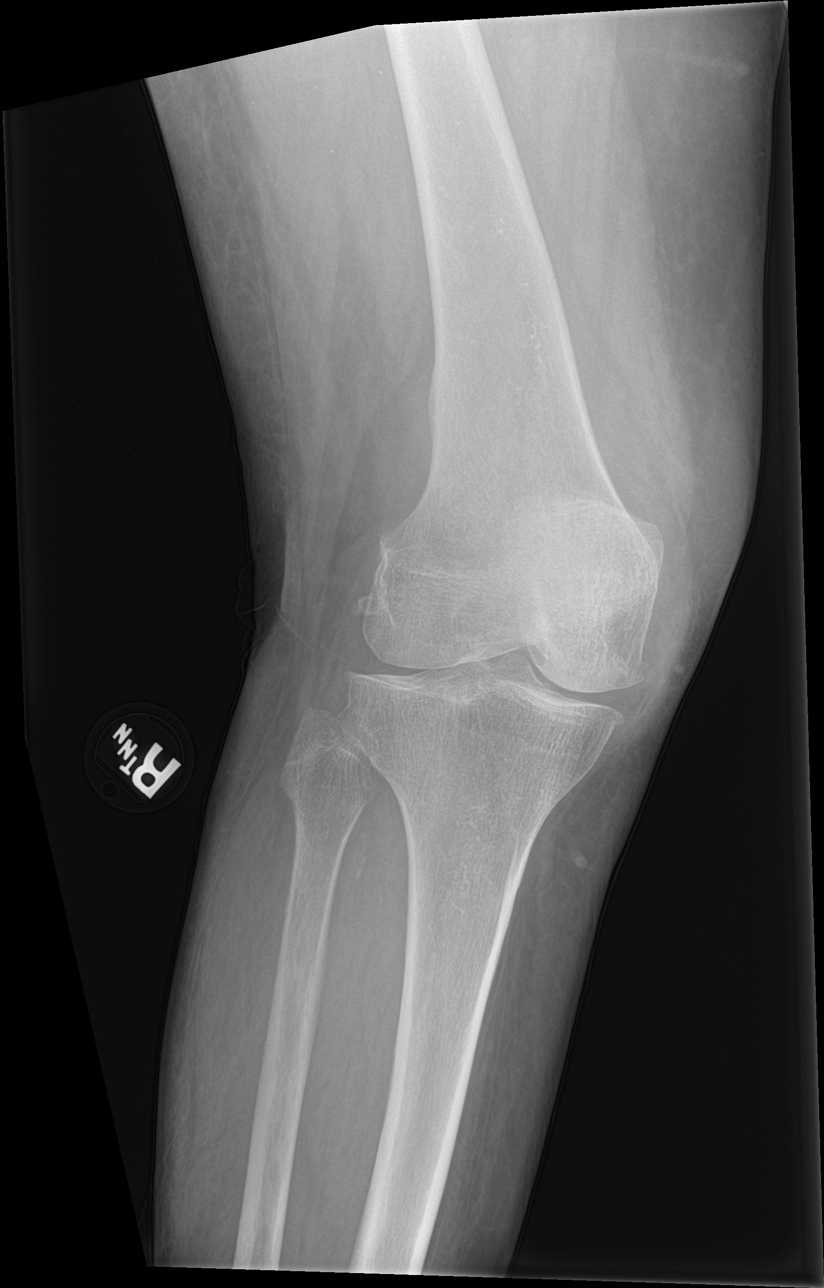

[4 of 4 positions shown; findings below may reference images not displayed]

FINDINGS: No fracture or bone lesion.

Mild mediolateral joint space compartment narrowing. Small marginal
osteophytes from all 3 compartments.

Small joint effusion suggested. There is mild anterolateral
subcutaneous soft tissue edema.
IMPRESSION: 1. No fracture or bone lesion.
2. Mild arthropathic changes, most likely osteoarthritis.
3. Probable small joint effusion.

## 2017-07-31 ENCOUNTER — Other Ambulatory Visit: Payer: Medicare Other

## 2017-08-02 ENCOUNTER — Encounter: Payer: Self-pay | Admitting: Hematology and Oncology

## 2017-08-07 ENCOUNTER — Inpatient Hospital Stay: Payer: Medicare Other | Attending: Hematology and Oncology | Admitting: Hematology and Oncology

## 2017-08-07 ENCOUNTER — Encounter: Payer: Self-pay | Admitting: Hematology and Oncology

## 2017-08-07 VITALS — BP 119/78 | HR 99 | Temp 98.5°F | Resp 20 | Wt 227.4 lb

## 2017-08-07 DIAGNOSIS — R32 Unspecified urinary incontinence: Secondary | ICD-10-CM | POA: Diagnosis not present

## 2017-08-07 DIAGNOSIS — F329 Major depressive disorder, single episode, unspecified: Secondary | ICD-10-CM | POA: Insufficient documentation

## 2017-08-07 DIAGNOSIS — I7100 Dissection of unspecified site of aorta: Secondary | ICD-10-CM | POA: Diagnosis not present

## 2017-08-07 DIAGNOSIS — Z9049 Acquired absence of other specified parts of digestive tract: Secondary | ICD-10-CM | POA: Insufficient documentation

## 2017-08-07 DIAGNOSIS — D509 Iron deficiency anemia, unspecified: Secondary | ICD-10-CM | POA: Insufficient documentation

## 2017-08-07 DIAGNOSIS — D696 Thrombocytopenia, unspecified: Secondary | ICD-10-CM | POA: Insufficient documentation

## 2017-08-07 DIAGNOSIS — M25561 Pain in right knee: Secondary | ICD-10-CM | POA: Insufficient documentation

## 2017-08-07 DIAGNOSIS — K219 Gastro-esophageal reflux disease without esophagitis: Secondary | ICD-10-CM

## 2017-08-07 DIAGNOSIS — Z803 Family history of malignant neoplasm of breast: Secondary | ICD-10-CM

## 2017-08-07 DIAGNOSIS — F319 Bipolar disorder, unspecified: Secondary | ICD-10-CM | POA: Insufficient documentation

## 2017-08-07 DIAGNOSIS — E119 Type 2 diabetes mellitus without complications: Secondary | ICD-10-CM | POA: Diagnosis not present

## 2017-08-07 DIAGNOSIS — Z87891 Personal history of nicotine dependence: Secondary | ICD-10-CM | POA: Diagnosis not present

## 2017-08-07 DIAGNOSIS — Z86010 Personal history of colonic polyps: Secondary | ICD-10-CM | POA: Diagnosis not present

## 2017-08-07 DIAGNOSIS — K7460 Unspecified cirrhosis of liver: Secondary | ICD-10-CM | POA: Diagnosis not present

## 2017-08-07 DIAGNOSIS — E785 Hyperlipidemia, unspecified: Secondary | ICD-10-CM | POA: Insufficient documentation

## 2017-08-07 DIAGNOSIS — Z800 Family history of malignant neoplasm of digestive organs: Secondary | ICD-10-CM

## 2017-08-07 DIAGNOSIS — Z79899 Other long term (current) drug therapy: Secondary | ICD-10-CM | POA: Insufficient documentation

## 2017-08-07 DIAGNOSIS — R161 Splenomegaly, not elsewhere classified: Secondary | ICD-10-CM | POA: Insufficient documentation

## 2017-08-07 DIAGNOSIS — J45909 Unspecified asthma, uncomplicated: Secondary | ICD-10-CM | POA: Insufficient documentation

## 2017-08-07 DIAGNOSIS — R0602 Shortness of breath: Secondary | ICD-10-CM | POA: Diagnosis not present

## 2017-08-07 DIAGNOSIS — Z8673 Personal history of transient ischemic attack (TIA), and cerebral infarction without residual deficits: Secondary | ICD-10-CM | POA: Diagnosis not present

## 2017-08-07 DIAGNOSIS — M129 Arthropathy, unspecified: Secondary | ICD-10-CM | POA: Diagnosis not present

## 2017-08-07 DIAGNOSIS — D649 Anemia, unspecified: Secondary | ICD-10-CM | POA: Insufficient documentation

## 2017-08-07 DIAGNOSIS — Z8711 Personal history of peptic ulcer disease: Secondary | ICD-10-CM | POA: Diagnosis not present

## 2017-08-07 DIAGNOSIS — F419 Anxiety disorder, unspecified: Secondary | ICD-10-CM | POA: Insufficient documentation

## 2017-08-07 DIAGNOSIS — Z794 Long term (current) use of insulin: Secondary | ICD-10-CM | POA: Diagnosis not present

## 2017-08-07 DIAGNOSIS — G4730 Sleep apnea, unspecified: Secondary | ICD-10-CM | POA: Diagnosis not present

## 2017-08-07 DIAGNOSIS — Z801 Family history of malignant neoplasm of trachea, bronchus and lung: Secondary | ICD-10-CM | POA: Insufficient documentation

## 2017-08-07 DIAGNOSIS — R748 Abnormal levels of other serum enzymes: Secondary | ICD-10-CM

## 2017-08-07 DIAGNOSIS — B49 Unspecified mycosis: Secondary | ICD-10-CM

## 2017-08-07 MED ORDER — NYSTATIN 100000 UNIT/GM EX POWD: Freq: Four times a day (QID) | CUTANEOUS | 0 refills | Status: DC

## 2017-08-07 NOTE — Progress Notes (Signed)
[facility] day:  08/07/2017  Chief Complaint: Stephanie [name] is a 67 y.o. female with chronic thrombocytopenia and iron deficiency anemia who is seen for 3 month assessment.  HPI: The patient was last seen in the medical oncology clinic on 04/10/2017.  At that time, she noted increased bruising.  Exam revealed scattered bruises and no petechiae.  [facility] labs on 05/19/2017 revealed a hematocrit of 37.5, hemoglobin 12.4, MCV 96, platelets 117,000, WBC 5500 with an ANC of 3900.  LFTS revealed an albumen of 3.5, bilirubin 0.6, alkaline phosphatase of 219 (39-117), SGOT 25, SPGT 20.  PT was 10.8 (INR 1.0).  AFP was 4.6.  Ferritin was 329.  Abdominal ultrasound on 05/24/2017 revealed hepatic cirrhosis with mild splenomegaly (15.5 cm). She is status post cholecystectomy.  She was admitted to [facility] from 07/19/2017 - 07/22/2017 with a group B strept UTI and altered mental status.  LFTs on 07/19/2017 revealed an alkaline phosphatase of 143.  Platelet count was 108,000 - 124,000.  She was treated with Augmentin.    During the interim, she has felt "pretty good".  She notes "lots of bruises" and doesn't know "where they come from".  She takes Tramadol for pain.  She denies use of aspirin or ibuprofen.  Diet is good.  She denies any bleeding (melena, hematochezia, hematuria or vaginal bleeding).  She has had right knee pain.  She states that Dr. Roland Rack has recommended surgery.    Past Medical History:  Diagnosis Date  . Adenomatous colon polyp 08/27/2014   Polyps x 3  . Anemia   . Anxiety   . 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 Arthritis   . Asthma   . Bipolar affective disorder ([place])    h/o  . Blood transfusion 2000  . Blood transfusion without reported diagnosis   . Cancer ([place])   . Chronic abdominal pain    abdominal wall pain  . 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 Colon polyp   . Depression   . Diabetes mellitus without complication ([place])    . Fatty liver 04/09/08   found in abd CT  . GERD (gastroesophageal reflux disease)   . H/O: CVA (cardiovascular accident)    TIA  . H/O: rheumatic fever   . Hepatitis    HEP "B" twenty years ago  . Hyperlipidemia   . Incontinence   . Insomnia   . Lower extremity edema   . Obstructive sleep apnea    Use C-PAP twice per week  . Platelets decreased ([place])   . RLS (restless legs syndrome)   . Shortness of breath   . Sleep apnea   . Stroke [facility])    TIA- 2002  . Ulcer   . Unspecified disorders of nervous system     Past Surgical History:  Procedure Laterality Date  . ABDOMINAL EXPLORATION SURGERY    . ABDOMINAL HYSTERECTOMY     total  . Axillary artery cannulation via 8-mm Hemashield graft, median sternotomy, extracorporeal circulation with deep hypothermic circulatory arrest, repair of  aortic dessection  01/23/2009   Dr Arlyce Dice  . BACK SURGERY      x 5  . cataract     both eyes  . CHOLECYSTECTOMY    . COLONOSCOPY    . EYE SURGERY Bilateral    Cataract Extraction with IOL  . HEMORROIDECTOMY     and colon polyp removed  . KNEE ARTHROSCOPY WITH LATERAL  RELEASE Right 11/15/2016   Procedure: KNEE ARTHROSCOPY WITH LATERAL RELEASE;  Surgeon: Corky Mull, MD;  Location: [facility];  Service: Orthopedics;  Laterality: Right;  . LIVER BIOPSY  04/10/2012   Procedure: LIVER BIOPSY;  Surgeon: Inda Castle, MD;  Location: [facility];  Service: Endoscopy;  Laterality: N/A;  ultrasound to mark order for abd limited/liver to be marked to be sent by [email or web address]   . LUMBAR FUSION  10/09  . LUMBAR WOUND DEBRIDEMENT  05/09/2011   Procedure: LUMBAR WOUND DEBRIDEMENT;  Surgeon: Dahlia Bailiff;  Location: [facility];  Service: Orthopedics;  Laterality: N/A;  IRRIGATION AND DEBRIDEMENT SPINAL WOUND  . POSTERIOR CERVICAL FUSION/FORAMINOTOMY    . ROTATOR CUFF REPAIR     left    Family History  Problem Relation Age of Onset  . Breast cancer Mother   . Lung cancer Mother   . Stomach cancer  Father   . Diabetes Unknown        4 aunts, and 1 uncle  . Colon cancer Neg Hx   . Esophageal cancer Neg Hx   . Rectal cancer Neg Hx     Social History:  reports that she quit smoking about 4 years ago. Her smoking use included cigarettes. She has a 75.00 pack-year smoking history. she has never used smokeless tobacco. She reports that she does not drink alcohol or use drugs.  She is a resident at the [facility].  She is accompanied by the transportation specialist from [place] today.  Allergies:  Allergies  Allergen Reactions  . Doxycycline Hives, Swelling, Other (See Comments) and Nausea Only    Reaction:  Facial swelling  Face red and swollen; no difficulty breathing    Current Medications: Current Outpatient Medications  Medication Sig Dispense Refill  . budesonide-formoterol (SYMBICORT) 160-4.5 MCG/ACT inhaler Inhale 2 puffs into the lungs 2 (two) times daily. (0800 & 2000)    . clonazePAM (KLONOPIN) 0.25 MG disintegrating tablet Take 0.25 mg by mouth 3 (three) times daily.    Marland Kitchen dicyclomine (BENTYL) 10 MG capsule Take 10 mg by mouth 3 (three) times daily. (0800, 1300 & 2000)    . ferrous sulfate 325 (65 FE) MG tablet Take 325 mg by mouth 2 (two) times daily. (0800 & 2000)    . furosemide (LASIX) 80 MG tablet Take 80 mg by mouth daily. (0800)    . gabapentin (NEURONTIN) 600 MG tablet Take 600 mg by mouth 2 (two) times daily. (0800 and 2000)    . insulin lispro (HUMALOG KWIKPEN) 100 UNIT/ML KiwkPen Inject 0-12 Units into the skin 3 (three) times daily. Check FSBS before each meal & cover with SSI (sliding scale insulin)     . lactulose (CHRONULAC) 10 GM/15ML solution Take 10 g by mouth daily. (0800)    . lamoTRIgine (LAMICTAL) 100 MG tablet Take 100 mg by mouth 2 (two) times daily. (0800 & 2000)    . LYRICA 150 MG capsule Take 150 mg by mouth 3 (three) times daily. (0800, 1400 & 2000)    . metoCLOPramide (REGLAN) 5 MG tablet Take 5 mg by mouth 4 (four) times daily -  before  meals and at bedtime. (0800, 1200, 1600 & 2000)    . omeprazole (PRILOSEC) 20 MG capsule Take 20 mg by mouth daily.     Marland Kitchen oxybutynin (DITROPAN-XL) 5 MG 24 hr tablet Take 5 mg by mouth daily. (0800)    . rOPINIRole (REQUIP) 3 MG tablet Take 3 mg by mouth at bedtime. (  2000)    . simvastatin (ZOCOR) 10 MG tablet Take 10 mg by mouth at bedtime. (2000)    . spironolactone (ALDACTONE) 25 MG tablet Take 25 mg by mouth daily. (0800)    . vitamin C (ASCORBIC ACID) 500 MG tablet Take 500 mg by mouth 2 (two) times daily. (0800 & 2000)    . zolpidem (AMBIEN) 5 MG tablet Take 5 mg by mouth at bedtime. (2000)    . nystatin (MYCOSTATIN/NYSTOP) powder Apply topically 4 (four) times daily. 15 g 0   No current facility-administered medications for this visit.     Review of Systems:  GENERAL:  Feels "pretty good".  No fevers or sweats.  Weight down 9 pounds. PERFORMANCE STATUS (ECOG):  2 HEENT:  No visual changes, runny nose, sore throat, mouth sores or tenderness. Lungs:  Shortness of breath with exertion.  No cough.  No hemoptysis. Cardiac:  No chest pain, palpitations, orthopnea, or PND. GI: Eating well.  No nausea, vomiting, diarrhea, constipation, or hematochezia. GU:  No urgency, frequency, dysuria, or hematuria. Musculoskeletal:  Right knee pain.  No back pain.  No muscle tenderness. Extremities:  Pain in lower extremities.  No swelling. Skin:  Lots of bruises (see HPI).  No rashes or ulcers. Neuro:  No focal weakness.  No seizures.  No headache, numbness or weakness, balance or coordination issues. Endocrine:  Diabetes.  No thyroid issues, hot flashes or night sweats. Psych:  Nervous.  No mood changes, depression. Pain:  No focal pain. Review of systems:  All other systems reviewed and found to be negative.  Physical Exam: Blood pressure 119/78, pulse 99, temperature 98.5 F (36.9 C), temperature source Tympanic, resp. rate 20, weight 227 lb 7 oz (103.2 kg), SpO2 93 %. GENERAL:  Well developed,  well nourished, heavyset woman sitting comfortably in the exam room in no acute distress.  MENTAL STATUS:  Alert and oriented to person, place and time. HEAD:  Short gray hair.  Normocephalic, atraumatic, face symmetric, no Cushingoid features. EYES:  Blue eyes s/p cataract surgery.  Pupils equal round and reactive to light and accomodation.  No conjunctivitis or scleral icterus. ENT:  Oropharynx clear without lesion.  Dentures.  Tongue normal. Mucous membranes dry.  RESPIRATORY:  Clear to auscultation without rales, wheezes or rhonchi. CARDIOVASCULAR:  Regular rate and rhythm without murmur, rub or gallop. ABDOMEN:  Soft, fully round, with active bowel sounds, and no appreciable hepatosplenomegaly.  No guarding or rebound tenderness.  No masses. SKIN:  Candida infection under pannus.  Prominent abdominal wall veins.  No rashes, ulcers.  Scattered small bruises.  No petechiae. EXTREMITIES:  Chronic bilateral lower extremity edema.  No palpable cords. LYMPH NODES: No palpable cervical, supraclavicular, axillary or inguinal adenopathy  NEUROLOGICAL: Unremarkable. PSYCH:  Appropriate.   [facility] Labs: 09/19/2016:  Hematocrit 39.0, hemoglobin 12.6, MCV 94, platelets are 110,000, white count 9000 with an ANC of 7200. Comprehensive metabolic panel included an alkaline phosphatase of 213. Bilirubin, SGOT and SGPT were normal.  Creatinine was normal. Iron studies included a ferritin of 136, iron saturation of 28% and a TIBC of 310. 04/05/2017:  Hematocrit 39.7, hemoglobin 13.3, MCV 96, platelets are 107,000, white count 5700 with an ANC of 4200. Comprehensive metabolic panel included an alkaline phosphatase of 225. Bilirubin, SGOT and SGPT were normal.  Creatinine was 0.78.  Ferritin was  214. PT was 10.9 with an INR of 1.1. 05/19/2017:  Hematocrit of 37.5, hemoglobin 12.4, MCV 96, platelets 117,000, WBC 5500 with an ANC of  3900.  LFTS revealed an albumen of 3.5, bilirubin 0.6, alkaline phosphatase of 219  (39-117), SGOT 25, SPGT 20.  PT was 10.8 (INR 1.0).  AFP was 4.6.  Ferritin was 329. 07/28/2017:  Alkaline phosphatase 176 (improved).   No visits with results within 3 Day(s) from this visit.  Latest known visit with results is:  Admission on 07/19/2017, Discharged on 07/22/2017  Component Date Value Ref Range Status  . Sodium 07/19/2017 142  135 - 145 mmol/L Final  . Potassium 07/19/2017 3.5  3.5 - 5.1 mmol/L Final  . Chloride 07/19/2017 106  101 - 111 mmol/L Final  . CO2 07/19/2017 27  22 - 32 mmol/L Final  . Glucose, Bld 07/19/2017 170* 65 - 99 mg/dL Final  . BUN 07/19/2017 14  6 - 20 mg/dL Final  . Creatinine, Ser 07/19/2017 0.97  0.44 - 1.00 mg/dL Final  . Calcium 07/19/2017 8.8* 8.9 - 10.3 mg/dL Final  . Total Protein 07/19/2017 6.5  6.5 - 8.1 g/dL Final  . Albumin 07/19/2017 3.2* 3.5 - 5.0 g/dL Final  . AST 07/19/2017 25  15 - 41 U/L Final  . ALT 07/19/2017 19  14 - 54 U/L Final  . Alkaline Phosphatase 07/19/2017 143* 38 - 126 U/L Final  . Total Bilirubin 07/19/2017 0.7  0.3 - 1.2 mg/dL Final  . GFR calc non Af Amer 07/19/2017 60* >60 mL/min Final  . GFR calc Af Amer 07/19/2017 >60  >60 mL/min Final   Comment: (NOTE) The eGFR has been calculated using the CKD EPI equation. This calculation has not been validated in all clinical situations. eGFR's persistently <60 mL/min signify possible Chronic Kidney Disease.   Georgiann Hahn gap 07/19/2017 9  5 - 15 Final   Performed at [facility], [address] [place], [place] [address]  . WBC 07/19/2017 7.8  3.6 - 11.0 K/uL Final  . RBC 07/19/2017 4.05  3.80 - 5.20 MIL/uL Final  . Hemoglobin 07/19/2017 12.8  12.0 - 16.0 g/dL Final  . HCT 07/19/2017 38.0  35.0 - 47.0 % Final  . MCV 07/19/2017 93.8  80.0 - 100.0 fL Final  . MCH 07/19/2017 31.5  26.0 - 34.0 pg Final  . MCHC 07/19/2017 33.5  32.0 - 36.0 g/dL Final  . RDW 07/19/2017 16.1* 11.5 - 14.5 % Final  . Platelets 07/19/2017 124* 150 - 440 K/uL Final  . Neutrophils  Relative % 07/19/2017 74  % Final  . Neutro Abs 07/19/2017 5.8  1.4 - 6.5 K/uL Final  . Lymphocytes Relative 07/19/2017 15  % Final  . Lymphs Abs 07/19/2017 1.2  1.0 - 3.6 K/uL Final  . Monocytes Relative 07/19/2017 10  % Final  . Monocytes Absolute 07/19/2017 0.8  0.2 - 0.9 K/uL Final  . Eosinophils Relative 07/19/2017 1  % Final  . Eosinophils Absolute 07/19/2017 0.1  0 - 0.7 K/uL Final  . Basophils Relative 07/19/2017 0  % Final  . Basophils Absolute 07/19/2017 0.0  0 - 0.1 K/uL Final   Performed at [facility], [address] [place], [place] [address]  . Specimen Description 07/19/2017    Final                   Value:BLOOD lLT [facility] Performed at [facility], [address] [place], [place] [address]   . Special Requests 07/19/2017    Final                   Value:BOTTLES  DRAWN AEROBIC AND ANAEROBIC Blood Culture adequate volume Performed at [facility], [address] [place], [place] [address]   . Culture  Setup Time 07/19/2017    Final                   Value:IN BOTH AEROBIC AND ANAEROBIC BOTTLES GRAM POSITIVE COCCI CRITICAL RESULT CALLED TO, READ BACK BY AND VERIFIED WITH: DAVID BASANTI ON 07/21/17 AT 0023 [facility]   . Culture 07/19/2017 *  Final                   Value:STAPHYLOCOCCUS SPECIES (COAGULASE NEGATIVE) THE SIGNIFICANCE OF ISOLATING THIS ORGANISM FROM A SINGLE SET OF BLOOD CULTURES WHEN MULTIPLE SETS ARE DRAWN IS UNCERTAIN. PLEASE NOTIFY THE MICROBIOLOGY DEPARTMENT WITHIN ONE WEEK IF SPECIATION AND SENSITIVITIES ARE REQUIRED. Performed at [facility], [place] [address] [place], [place] [address]   . Report Status 07/19/2017 07/23/2017 FINAL   Final  . Specimen Description 07/19/2017 BLOOD RT FOREARM   Final  . Special Requests 07/19/2017 BOTTLES DRAWN AEROBIC AND ANAEROBIC Blood Culture results may not be optimal due to an excessive volume of blood received in culture bottles   Final  . Culture 07/19/2017    Final                    Value:NO GROWTH 5 DAYS Performed at [facility], [address], [place], [place] [address]   . Report Status 07/19/2017 07/24/2017 FINAL   Final  . Color, Urine 07/19/2017 YELLOW* YELLOW Final  . APPearance 07/19/2017 CLOUDY* CLEAR Final  . Specific Gravity, Urine 07/19/2017 1.015  1.005 - 1.030 Final  . pH 07/19/2017 6.0  5.0 - 8.0 Final  . Glucose, UA 07/19/2017 NEGATIVE  NEGATIVE mg/dL Final  . Hgb urine dipstick 07/19/2017 NEGATIVE  NEGATIVE Final  . Bilirubin Urine 07/19/2017 NEGATIVE  NEGATIVE Final  . Ketones, ur 07/19/2017 NEGATIVE  NEGATIVE mg/dL Final  . Protein, ur 07/19/2017 NEGATIVE  NEGATIVE mg/dL Final  . Nitrite 07/19/2017 NEGATIVE  NEGATIVE Final  . Leukocytes, UA 07/19/2017 MODERATE* NEGATIVE Final  . RBC / HPF 07/19/2017 0-5  0 - 5 RBC/hpf Final  . WBC, UA 07/19/2017 TOO NUMEROUS TO COUNT  0 - 5 WBC/hpf Final  . Bacteria, UA 07/19/2017 RARE* NONE SEEN Final  . Squamous Epithelial / LPF 07/19/2017 NONE SEEN  NONE SEEN Final  . WBC Clumps 07/19/2017 PRESENT   Final  . Mucus 07/19/2017 PRESENT   Final  . Non Squamous Epithelial 07/19/2017 0-5* NONE SEEN Final   Performed at [facility], [address], [place], [place] [address]  . Lactic Acid, Venous 07/19/2017 1.2  0.5 - 1.9 mmol/L Final   Performed at [facility], [address] [place], [place] [address]  . Lactic Acid, Venous 07/20/2017 1.1  0.5 - 1.9 mmol/L Final   Performed at [facility], [address] [place], [place] [address]  . Specimen Description 07/19/2017    Final                   Value:URINE, RANDOM Performed at [facility], [address] [place], [place] [address]   . Special Requests 07/19/2017    Final                   Value:NONE Performed at [facility], [address] [place], [place] [address]   . Culture 07/19/2017 *  Final  Value:>=100,000 COLONIES/mL GROUP B STREP(S.AGALACTIAE)ISOLATED TESTING  AGAINST S. AGALACTIAE NOT ROUTINELY PERFORMED DUE TO PREDICTABILITY OF AMP/PEN/VAN SUSCEPTIBILITY. Performed at [facility], [place] [address] [place], [place] [address]   . Report Status 07/19/2017 07/21/2017 FINAL   Final  . Troponin I 07/19/2017 <0.03  <0.03 ng/mL Final   Performed at [facility], [address] [place], [place] [address]  . Ammonia 07/19/2017 38* 9 - 35 umol/L Final   Performed at [facility], [address] [place], [place] [address]  . TSH 07/19/2017 4.486  0.350 - 4.500 uIU/mL Final   Comment: Performed by a 3rd Generation assay with a functional sensitivity of <=0.01 uIU/mL. Performed at [facility], [address] [place], [place] [address]   . Tricyclic, Ur Screen 48/18/5631 POSITIVE* NONE DETECTED Final  . Amphetamines, Ur Screen 07/19/2017 NONE DETECTED  NONE DETECTED Final  . MDMA (Ecstasy)Ur Screen 07/19/2017 NONE DETECTED  NONE DETECTED Final  . Cocaine Metabolite,Ur [place] 07/19/2017 NONE DETECTED  NONE DETECTED Final  . Opiate, Ur Screen 07/19/2017 NONE DETECTED  NONE DETECTED Final  . Phencyclidine (PCP) Ur S 07/19/2017 NONE DETECTED  NONE DETECTED Final  . Cannabinoid 50 Ng, Ur [place] 07/19/2017 NONE DETECTED  NONE DETECTED Final  . Barbiturates, Ur Screen 07/19/2017 NONE DETECTED  NONE DETECTED Final  . Benzodiazepine, Ur Scrn 07/19/2017 NONE DETECTED  NONE DETECTED Final  . Methadone Scn, Ur 07/19/2017 NONE DETECTED  NONE DETECTED Final   Comment: (NOTE) Tricyclics + metabolites, urine    Cutoff 1000 ng/mL Amphetamines + metabolites, urine  Cutoff 1000 ng/mL MDMA (Ecstasy), urine              Cutoff 500 ng/mL Cocaine Metabolite, urine          Cutoff 300 ng/mL Opiate + metabolites, urine        Cutoff 300 ng/mL Phencyclidine (PCP), urine         Cutoff 25 ng/mL Cannabinoid, urine                 Cutoff 50 ng/mL Barbiturates + metabolites, urine  Cutoff 200 ng/mL Benzodiazepine, urine              Cutoff 200  ng/mL Methadone, urine                   Cutoff 300 ng/mL The urine drug screen provides only a preliminary, unconfirmed analytical test result and should not be used for non-medical purposes. Clinical consideration and professional judgment should be applied to any positive drug screen result due to possible interfering substances. A more specific alternate chemical method must be used in order to obtain a confirmed analytical result. Gas chromatography / mass spectrometry (GC/MS) is the preferred confirmat                          ory method. Performed at [facility], [address] [place], [place] [address]   . Acetaminophen (Tylenol), Serum 07/19/2017 <10* 10 - 30 ug/mL Final   Comment:        THERAPEUTIC CONCENTRATIONS VARY SIGNIFICANTLY. A RANGE OF 10-30 ug/mL MAY BE AN EFFECTIVE CONCENTRATION FOR MANY PATIENTS. HOWEVER, SOME ARE BEST TREATED AT CONCENTRATIONS OUTSIDE THIS RANGE. ACETAMINOPHEN CONCENTRATIONS >150 ug/mL AT 4 HOURS AFTER INGESTION AND >50 ug/mL AT 12 HOURS AFTER INGESTION ARE OFTEN ASSOCIATED WITH TOXIC REACTIONS. Performed at [facility], [address] [place], [place] [address]   .  Salicylate Lvl 94/49/6759 <7.0  2.8 - 30.0 mg/dL Final   Performed at [facility], [address] [place], [place] [address]  . Ammonia 07/20/2017 34  9 - 35 umol/L Final   Performed at [facility], [address] [place], [place] [address]  . MRSA by PCR 07/20/2017 INVALID, UNABLE TO DETERMINE THE PRESENCE OF TARGET DNA DUE TO SPECIMEN INTEGRITY. RECOLLECTION REQUESTED.* NEGATIVE Final   Comment: SPOKE WITH MONIQUE ROBERSON AT 3570 07/20/17 REQUESTED RECOLLECTION SDR Performed at [facility], [address] [place], [place] [address]   . Procalcitonin 07/20/2017 <0.10  ng/mL Final   Comment:        Interpretation: PCT (Procalcitonin) <= 0.5 ng/mL: Systemic infection (sepsis) is not likely. Local bacterial  infection is possible. (NOTE)       Sepsis PCT Algorithm           Lower Respiratory Tract                                      Infection PCT Algorithm    ----------------------------     ----------------------------         PCT < 0.25 ng/mL                PCT < 0.10 ng/mL         Strongly encourage             Strongly discourage   discontinuation of antibiotics    initiation of antibiotics    ----------------------------     -----------------------------       PCT 0.25 - 0.50 ng/mL            PCT 0.10 - 0.25 ng/mL               OR       >80% decrease in PCT            Discourage initiation of                                            antibiotics      Encourage discontinuation           of antibiotics    ----------------------------     -----------------------------         PCT >= 0.50 ng/mL              PCT 0.26 - 0.50 ng/mL               AND                                 <80% decrease in PCT             Encourage initiation of                                             antibiotics       Encourage continuation           of antibiotics    ----------------------------     -----------------------------        PCT >= 0.50 ng/mL  PCT > 0.50 ng/mL               AND         increase in PCT                  Strongly encourage                                      initiation of antibiotics    Strongly encourage escalation           of antibiotics                                     -----------------------------                                           PCT <= 0.25 ng/mL                                                 OR                                        > 80% decrease in PCT                                     Discontinue / Do not initiate                                             antibiotics Performed at [facility], [address], [place], [place] [address]   . Sodium 07/20/2017 143  135 - 145 mmol/L Final  . Potassium 07/20/2017 3.5  3.5 - 5.1  mmol/L Final  . Chloride 07/20/2017 108  101 - 111 mmol/L Final  . CO2 07/20/2017 27  22 - 32 mmol/L Final  . Glucose, Bld 07/20/2017 114* 65 - 99 mg/dL Final  . BUN 07/20/2017 14  6 - 20 mg/dL Final  . Creatinine, Ser 07/20/2017 0.85  0.44 - 1.00 mg/dL Final  . Calcium 07/20/2017 8.2* 8.9 - 10.3 mg/dL Final  . GFR calc non Af Amer 07/20/2017 >60  >60 mL/min Final  . GFR calc Af Amer 07/20/2017 >60  >60 mL/min Final   Comment: (NOTE) The eGFR has been calculated using the CKD EPI equation. This calculation has not been validated in all clinical situations. eGFR's persistently <60 mL/min signify possible Chronic Kidney Disease.   Georgiann Hahn gap 07/20/2017 8  5 - 15 Final   Performed at [facility], [address] [place],  [address]  . WBC 07/20/2017 7.3  3.6 - 11.0 K/uL Final  . RBC 07/20/2017 3.70* 3.80 - 5.20 MIL/uL Final  . Hemoglobin 07/20/2017 11.8* 12.0 - 16.0 g/dL Final  . HCT 07/20/2017 34.8* 35.0 - 47.0 % Final  . MCV 07/20/2017  94.1  80.0 - 100.0 fL Final  . MCH 07/20/2017 31.9  26.0 - 34.0 pg Final  . MCHC 07/20/2017 33.9  32.0 - 36.0 g/dL Final  . RDW 07/20/2017 16.0* 11.5 - 14.5 % Final  . Platelets 07/20/2017 108* 150 - 440 K/uL Final   Performed at [facility], [address] [place], [place] [address]  . Glucose-Capillary 07/20/2017 112* 65 - 99 mg/dL Final  . MRSA by PCR 07/20/2017 NEGATIVE  NEGATIVE Final   Comment:        The GeneXpert MRSA Assay (FDA approved for NASAL specimens only), is one component of a comprehensive MRSA colonization surveillance program. It is not intended to diagnose MRSA infection nor to guide or monitor treatment for MRSA infections. Performed at [facility], [address] [place], [place] [address]   . Glucose-Capillary 07/20/2017 94  65 - 99 mg/dL Final  . Glucose-Capillary 07/20/2017 148* 65 - 99 mg/dL Final  . Procalcitonin 07/21/2017 <0.10  ng/mL Final   Comment:         Interpretation: PCT (Procalcitonin) <= 0.5 ng/mL: Systemic infection (sepsis) is not likely. Local bacterial infection is possible. (NOTE)       Sepsis PCT Algorithm           Lower Respiratory Tract                                      Infection PCT Algorithm    ----------------------------     ----------------------------         PCT < 0.25 ng/mL                PCT < 0.10 ng/mL         Strongly encourage             Strongly discourage   discontinuation of antibiotics    initiation of antibiotics    ----------------------------     -----------------------------       PCT 0.25 - 0.50 ng/mL            PCT 0.10 - 0.25 ng/mL               OR       >80% decrease in PCT            Discourage initiation of                                            antibiotics      Encourage discontinuation           of antibiotics    ----------------------------     -----------------------------         PCT >= 0.50 ng/mL              PCT 0.26 - 0.50 ng/mL               AND                                 <80% decrease in PCT             Encourage initiation of  antibiotics       Encourage continuation           of antibiotics    ----------------------------     -----------------------------        PCT >= 0.50 ng/mL                  PCT > 0.50 ng/mL               AND         increase in PCT                  Strongly encourage                                      initiation of antibiotics    Strongly encourage escalation           of antibiotics                                     -----------------------------                                           PCT <= 0.25 ng/mL                                                 OR                                        > 80% decrease in PCT                                     Discontinue / Do not initiate                                             antibiotics Performed at [facility], [address],  [place], [place] [address]   . Glucose-Capillary 07/20/2017 73  65 - 99 mg/dL Final  . Cortisol - AM 07/20/2017 0.7* 6.7 - 22.6 ug/dL Final   Performed at [facility] [address], [place], [place] [address]  . Glucose-Capillary 07/20/2017 115* 65 - 99 mg/dL Final  . Enterococcus species 07/19/2017 NOT DETECTED  NOT DETECTED Final  . Listeria monocytogenes 07/19/2017 NOT DETECTED  NOT DETECTED Final  . Staphylococcus species 07/19/2017 DETECTED* NOT DETECTED Final   Comment: Methicillin (oxacillin) susceptible coagulase negative staphylococcus. Possible blood culture contaminant (unless isolated from more than one blood culture draw or clinical case suggests pathogenicity). No antibiotic treatment is indicated for blood  culture contaminants. CRITICAL RESULT CALLED TO, READ BACK BY AND VERIFIED WITH: DAVID BASANTI ON 07/21/17 AT 0023 [facility]   . Staphylococcus aureus 07/19/2017 NOT DETECTED  NOT DETECTED Final  . Methicillin resistance 07/19/2017 NOT DETECTED  NOT DETECTED Final  . Streptococcus  species 07/19/2017 NOT DETECTED  NOT DETECTED Final  . Streptococcus agalactiae 07/19/2017 NOT DETECTED  NOT DETECTED Final  . Streptococcus pneumoniae 07/19/2017 NOT DETECTED  NOT DETECTED Final  . Streptococcus pyogenes 07/19/2017 NOT DETECTED  NOT DETECTED Final  . Acinetobacter baumannii 07/19/2017 NOT DETECTED  NOT DETECTED Final  . Enterobacteriaceae species 07/19/2017 NOT DETECTED  NOT DETECTED Final  . Enterobacter cloacae complex 07/19/2017 NOT DETECTED  NOT DETECTED Final  . Escherichia coli 07/19/2017 NOT DETECTED  NOT DETECTED Final  . Klebsiella oxytoca 07/19/2017 NOT DETECTED  NOT DETECTED Final  . Klebsiella pneumoniae 07/19/2017 NOT DETECTED  NOT DETECTED Final  . Proteus species 07/19/2017 NOT DETECTED  NOT DETECTED Final  . Serratia marcescens 07/19/2017 NOT DETECTED  NOT DETECTED Final  . Haemophilus influenzae 07/19/2017 NOT DETECTED  NOT DETECTED Final  . Neisseria  meningitidis 07/19/2017 NOT DETECTED  NOT DETECTED Final  . Pseudomonas aeruginosa 07/19/2017 NOT DETECTED  NOT DETECTED Final  . Candida albicans 07/19/2017 NOT DETECTED  NOT DETECTED Final  . Candida glabrata 07/19/2017 NOT DETECTED  NOT DETECTED Final  . Candida krusei 07/19/2017 NOT DETECTED  NOT DETECTED Final  . Candida parapsilosis 07/19/2017 NOT DETECTED  NOT DETECTED Final  . Candida tropicalis 07/19/2017 NOT DETECTED  NOT DETECTED Final   Performed at [facility], [address], [place], [place] [address]  . Sodium 07/21/2017 144  135 - 145 mmol/L Final  . Potassium 07/21/2017 4.1  3.5 - 5.1 mmol/L Final  . Chloride 07/21/2017 112* 101 - 111 mmol/L Final  . CO2 07/21/2017 26  22 - 32 mmol/L Final  . Glucose, Bld 07/21/2017 152* 65 - 99 mg/dL Final  . BUN 07/21/2017 9  6 - 20 mg/dL Final  . Creatinine, Ser 07/21/2017 0.67  0.44 - 1.00 mg/dL Final  . Calcium 07/21/2017 8.8* 8.9 - 10.3 mg/dL Final  . GFR calc non Af Amer 07/21/2017 >60  >60 mL/min Final  . GFR calc Af Amer 07/21/2017 >60  >60 mL/min Final   Comment: (NOTE) The eGFR has been calculated using the CKD EPI equation. This calculation has not been validated in all clinical situations. eGFR's persistently <60 mL/min signify possible Chronic Kidney Disease.   Georgiann Hahn gap 07/21/2017 6  5 - 15 Final   Performed at [facility], [address] [place], [place] [address]  . Magnesium 07/21/2017 2.1  1.7 - 2.4 mg/dL Final   Performed at [facility], [address] [place], [place] [address]  . Phosphorus 07/21/2017 3.7  2.5 - 4.6 mg/dL Final   Performed at [facility], [address] [place], [place] [address]  . Glucose-Capillary 07/21/2017 157* 65 - 99 mg/dL Final  . Ammonia 07/21/2017 45* 9 - 35 umol/L Final   Performed at [facility], [address] [place], [place] [address]  . Glucose-Capillary 07/21/2017 201* 65 - 99 mg/dL Final  . Glucose-Capillary  07/21/2017 125* 65 - 99 mg/dL Final  . Comment 1 07/21/2017 Notify RN   Final  . Procalcitonin 07/22/2017 <0.10  ng/mL Final   Comment:        Interpretation: PCT (Procalcitonin) <= 0.5 ng/mL: Systemic infection (sepsis) is not likely. Local bacterial infection is possible. (NOTE)       Sepsis PCT Algorithm           Lower Respiratory Tract  Infection PCT Algorithm    ----------------------------     ----------------------------         PCT < 0.25 ng/mL                PCT < 0.10 ng/mL         Strongly encourage             Strongly discourage   discontinuation of antibiotics    initiation of antibiotics    ----------------------------     -----------------------------       PCT 0.25 - 0.50 ng/mL            PCT 0.10 - 0.25 ng/mL               OR       >80% decrease in PCT            Discourage initiation of                                            antibiotics      Encourage discontinuation           of antibiotics    ----------------------------     -----------------------------         PCT >= 0.50 ng/mL              PCT 0.26 - 0.50 ng/mL               AND                                 <80% decrease in PCT             Encourage initiation of                                             antibiotics       Encourage continuation           of antibiotics    ----------------------------     -----------------------------        PCT >= 0.50 ng/mL                  PCT > 0.50 ng/mL               AND         increase in PCT                  Strongly encourage                                      initiation of antibiotics    Strongly encourage escalation           of antibiotics                                     -----------------------------  PCT <= 0.25 ng/mL                                                 OR                                        > 80% decrease in PCT                                      Discontinue / Do not initiate                                             antibiotics Performed at [facility], [address] [place], [place] [address]   . Glucose-Capillary 07/21/2017 222* 65 - 99 mg/dL Final  . Glucose-Capillary 07/22/2017 211* 65 - 99 mg/dL Final  . Comment 1 07/22/2017 Notify RN   Final  . Glucose-Capillary 07/22/2017 272* 65 - 99 mg/dL Final  . Comment 1 07/22/2017 Notify RN   Final    Assessment:  DARCUS EDDS is a 67 y.o. 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 Platelets have ranged between 108,000 and 128,000 without trend. She denies any new medications or herbal products.  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  Work-up on 08/29/2014 revealed a positive rheumatoid factor and ANA (anti-double-stranded DNA of 5- equivocal).  Negative studies included hepatitis B surface antibody and antigen, hepatitis C testing, and HIV testing. B12, SPEP, and free light chains were normal.  Additional testing on 10/31/2014 revealed negative hepatitis B core antibody and CMV IgM.  Abdominal ultrasound on 06/27/2014 revealed mild splenomegaly (14.2 cm) with moderate cirrhosis and no focal liver lesions. RUQ ultrasound on 09/29/2016 revealed a nodular liver (cirrhotic) without focal mass.   Abdominal ultrasound on 05/24/2017 revealed hepatic cirrhosis with mild splenomegaly (15.5 cm).  She is on lactulose 3/day.  AFP has been followed: 4 on 04/21/2015 and 4.6 on 05/19/2017.  She denies any alcohol use.  She has a history of recurrent iron deficiency anemia.  Ferritin was 20 on 05/11/2015 and 15 on 11/13/2015. Hematocrit was 29.7 on 11/13/2015.  Last colonoscopy was in 08/2014.  Guaiac cards were negative x 3 in 11/2015.  Diet is good.  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  She is s/p arthroscopic right knee surgery on 11/15/2016.  She is s/p left femur  fracture on 03/07/2017.  Alkaline phosphatase is elevated.  She was admitted to [facility] from 07/19/2017 - 07/22/2017 with a group B strept UTI and altered mental status.  Alkaline phosphatase was 143 on 07/19/2017.  Platelet count was 108,000 - 124,000.  She was treated with Augmentin.    Symptomatically, she has right knee pain.  She notes chronic bruising.  Exam reveals no petechiae.  She has a Candidal fungal infection beneath her pannus.  Plan: 1.  [facility] labs today: CBC with diff.   2.  Review interval abdominal ultrasound- splenomegaly.  No liver lesions. 3.  Complete paperwork for [facility]  at [facility]. 4.  Rx:  Nystatin powder topically 4 times daily. 5.  RTC in 3 months for MD assessment and review of [facility] labs (CBC with diff, CMP, AFP, PT/INR).   Nolon Stalls, MD 08/07/2017, 3:35 PM

## 2017-08-07 NOTE — Progress Notes (Signed)
Patient here today for follow up.  Walking without assistance today. Other than right knee pain, offers no complaints.

## 2017-08-11 IMAGING — CT CT ANGIO CHEST
1 of 6 series · 19 of 36 positions shown · IV contrast (APPLIED)
Comparison: 06/28/2016, 12/21/2016

CLINICAL DATA: Chest pain and shortness of breath

EXAM:
CT ANGIOGRAPHY CHEST WITH CONTRAST
TECHNIQUE: Multidetector CT imaging of the chest was performed using the
standard protocol during bolus administration of intravenous
contrast. Multiplanar CT image reconstructions and MIPs were
obtained to evaluate the vascular anatomy.
CONTRAST:  75 mL Isovue 370.

[Series 5: thins · axial · 0.84mm/px · z∈[-677,-434]mm · 19 of 271 slices shown]
[im 14/271  lung]
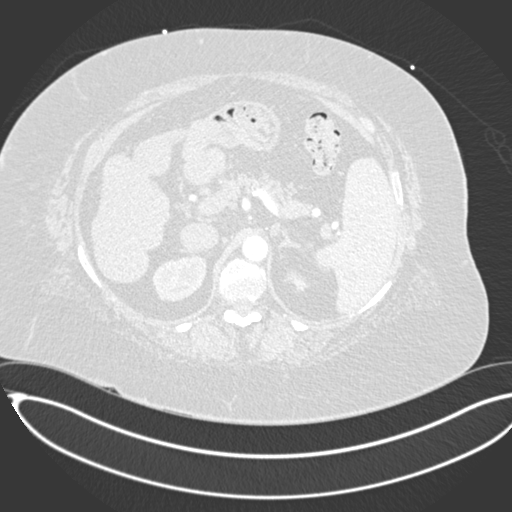
[im 28/271  mediastinal]
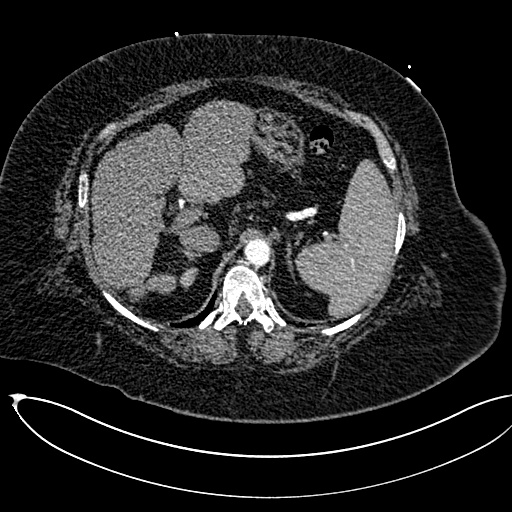
[im 41/271  lung]
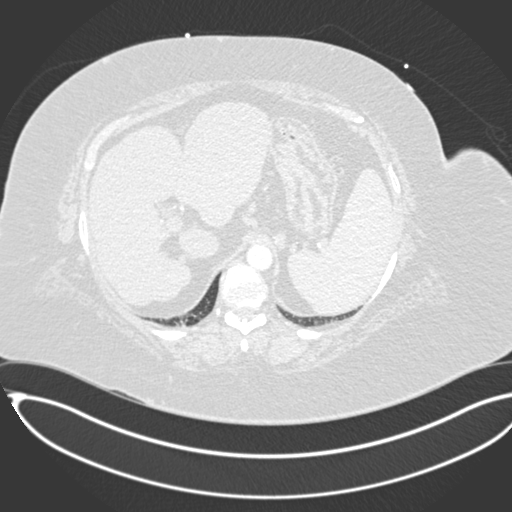
[im 55/271  mediastinal]
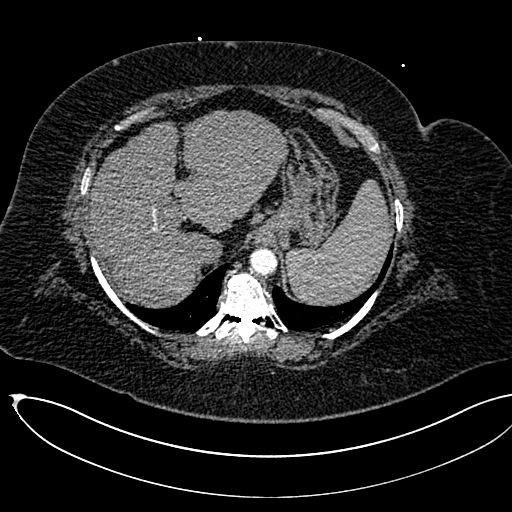
[im 68/271  lung]
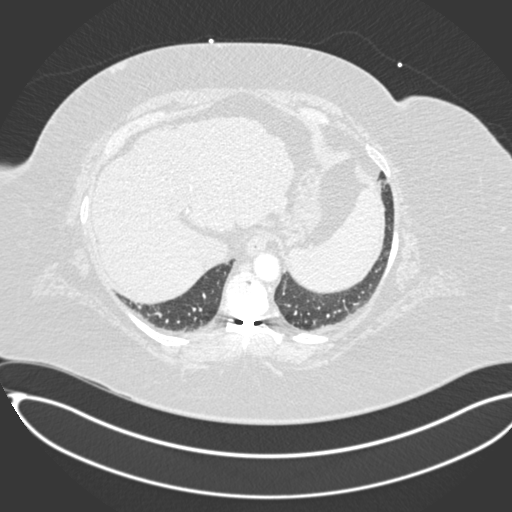
[im 82/271  mediastinal]
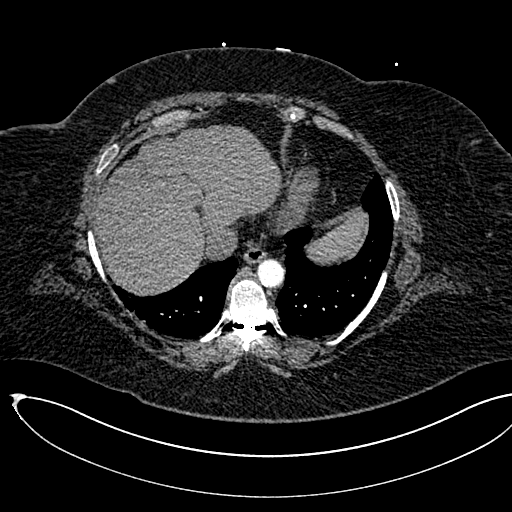
[im 95/271  lung]
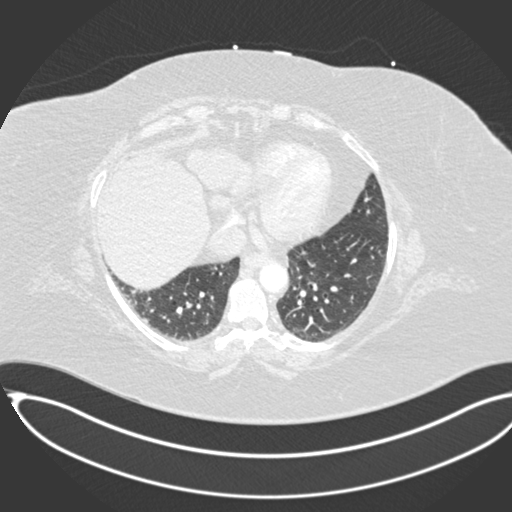
[im 109/271  mediastinal]
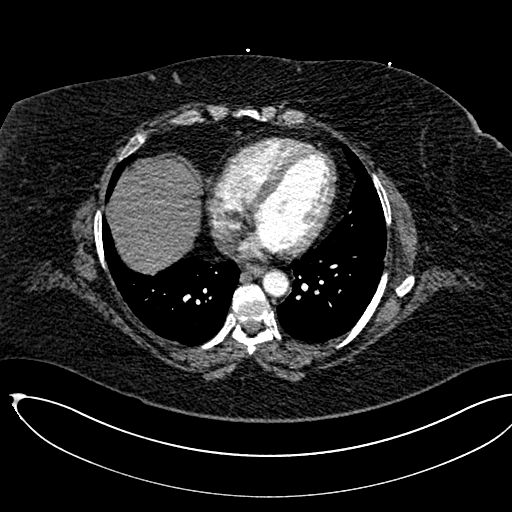
[im 122/271  lung]
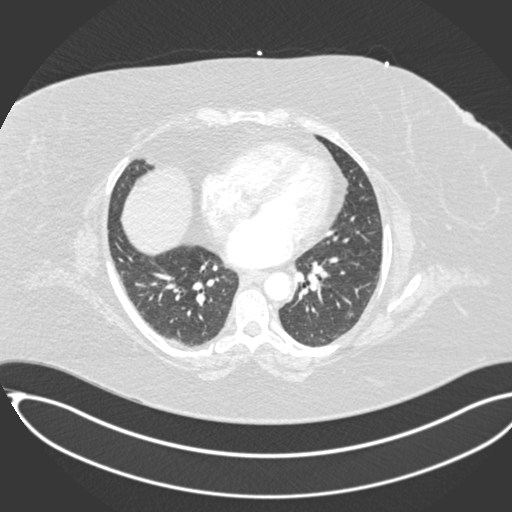
[im 136/271  mediastinal]
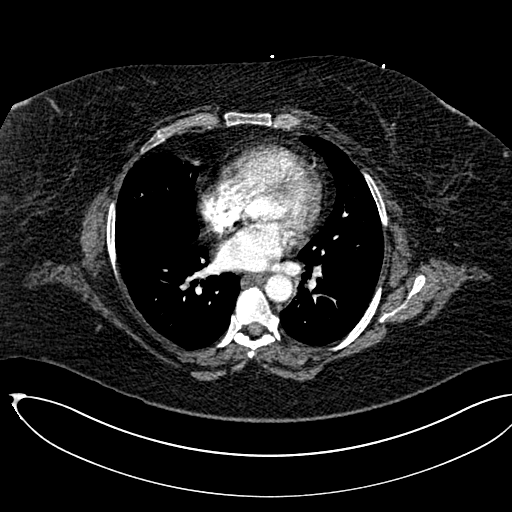
[im 149/271  lung]
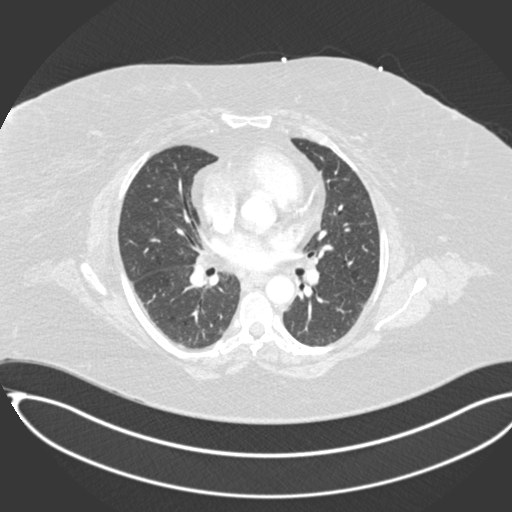
[im 163/271  mediastinal]
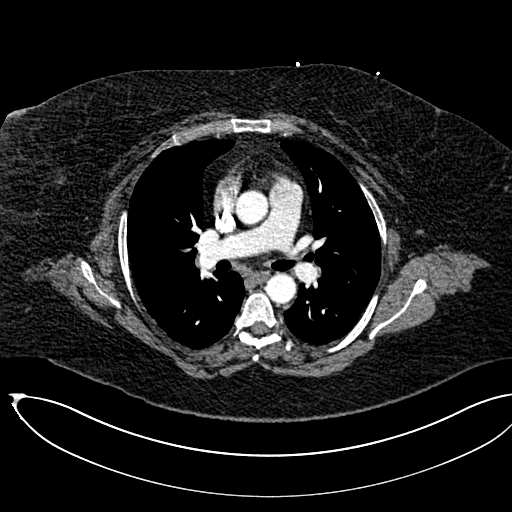
[im 176/271  lung]
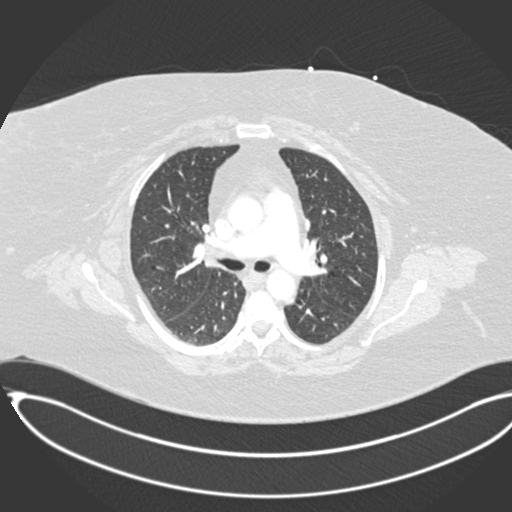
[im 190/271  mediastinal]
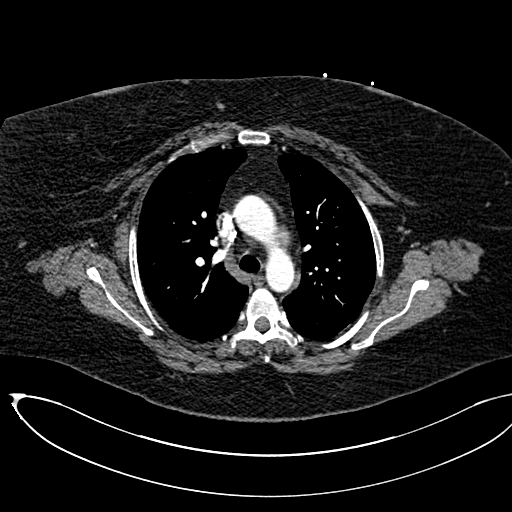
[im 203/271  lung]
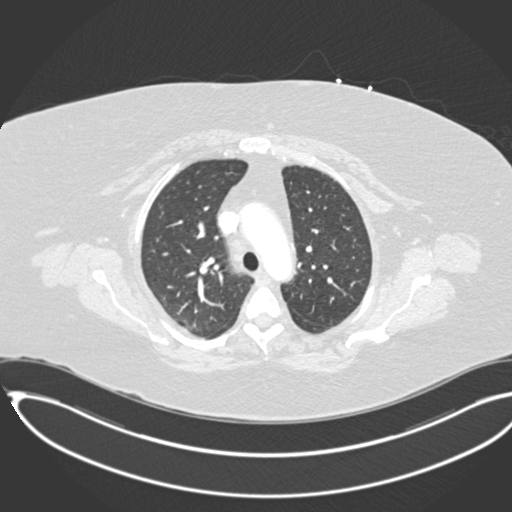
[im 217/271  mediastinal]
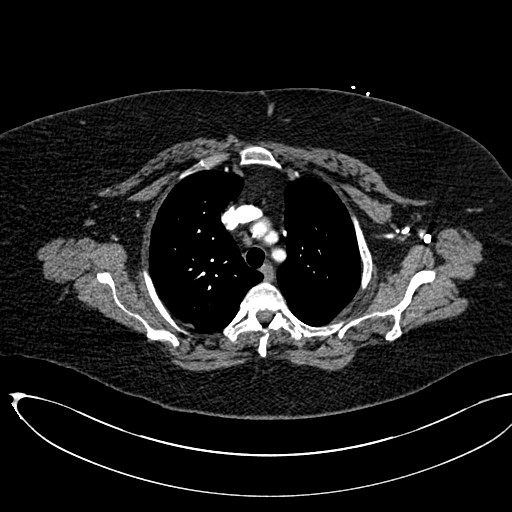
[im 230/271  lung]
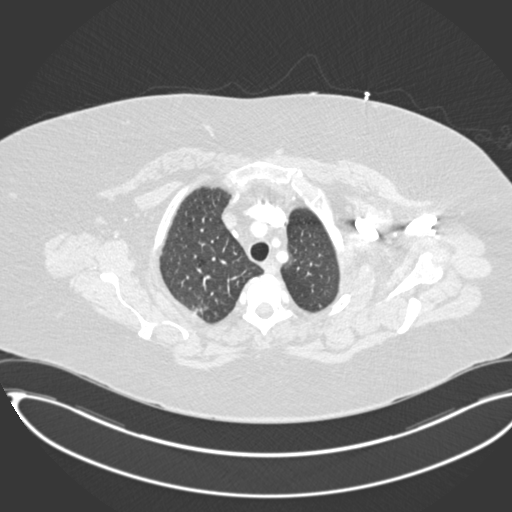
[im 244/271  mediastinal]
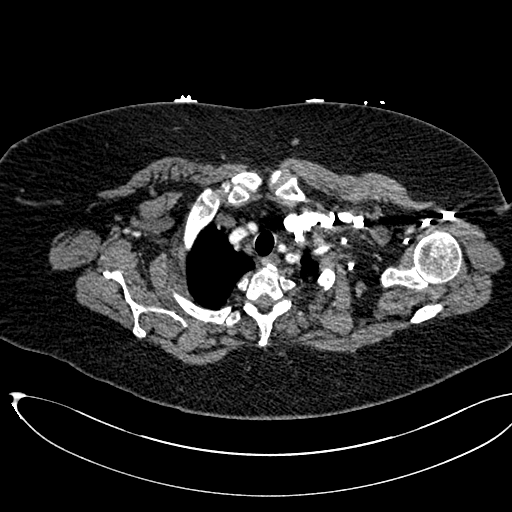
[im 257/271  lung]
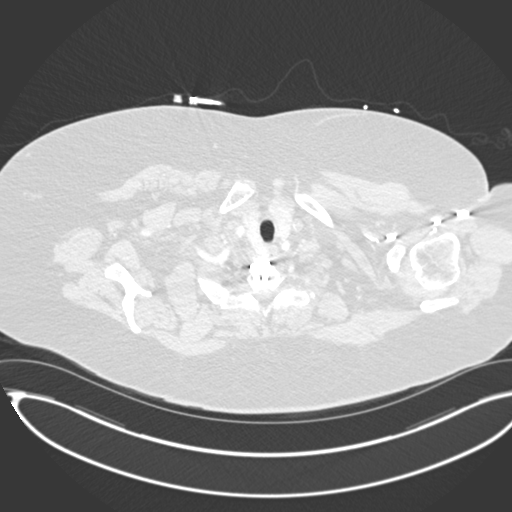

[19 of 36 positions shown; findings below may reference images not displayed]

FINDINGS: Cardiovascular: Thoracic aorta and its branches demonstrate
atherosclerotic calcification without aneurysmal dilatation or
dissection. No significant cardiac enlargement is noted. Coronary
calcifications are seen. Pulmonary artery shows no evidence of
pulmonary embolism.

Mediastinum/Nodes: The thoracic inlet is within normal limits. No
significant hilar or mediastinal adenopathy is identified. The
esophagus as visualize is within normal limits.

Lungs/Pleura: The lungs are well-aerated without focal infiltrate or
sizable effusion. No pneumothorax is noted.

Upper Abdomen: The liver is nodular and somewhat shrunken likely
related to cirrhotic change. The remainder of the upper abdomen is
within normal limits.

Musculoskeletal: The osseous structures show postsurgical change in
the cervical spine. A spinal stimulator is noted. No acute bony
abnormality is seen.

Review of the MIP images confirms the above findings.
IMPRESSION: No evidence of pulmonary emboli.

Changes consistent with cirrhosis.

Aortic Atherosclerosis ([id number]).

## 2017-08-11 IMAGING — DX DG CHEST 1V PORT
1 series · 1 of 1 positions shown · non-contrast
Comparison: Chest radiograph dated 12/06/2016

CLINICAL DATA: 66-year-old female with chest pain

EXAM:
PORTABLE CHEST 1 VIEW

[chest ap]
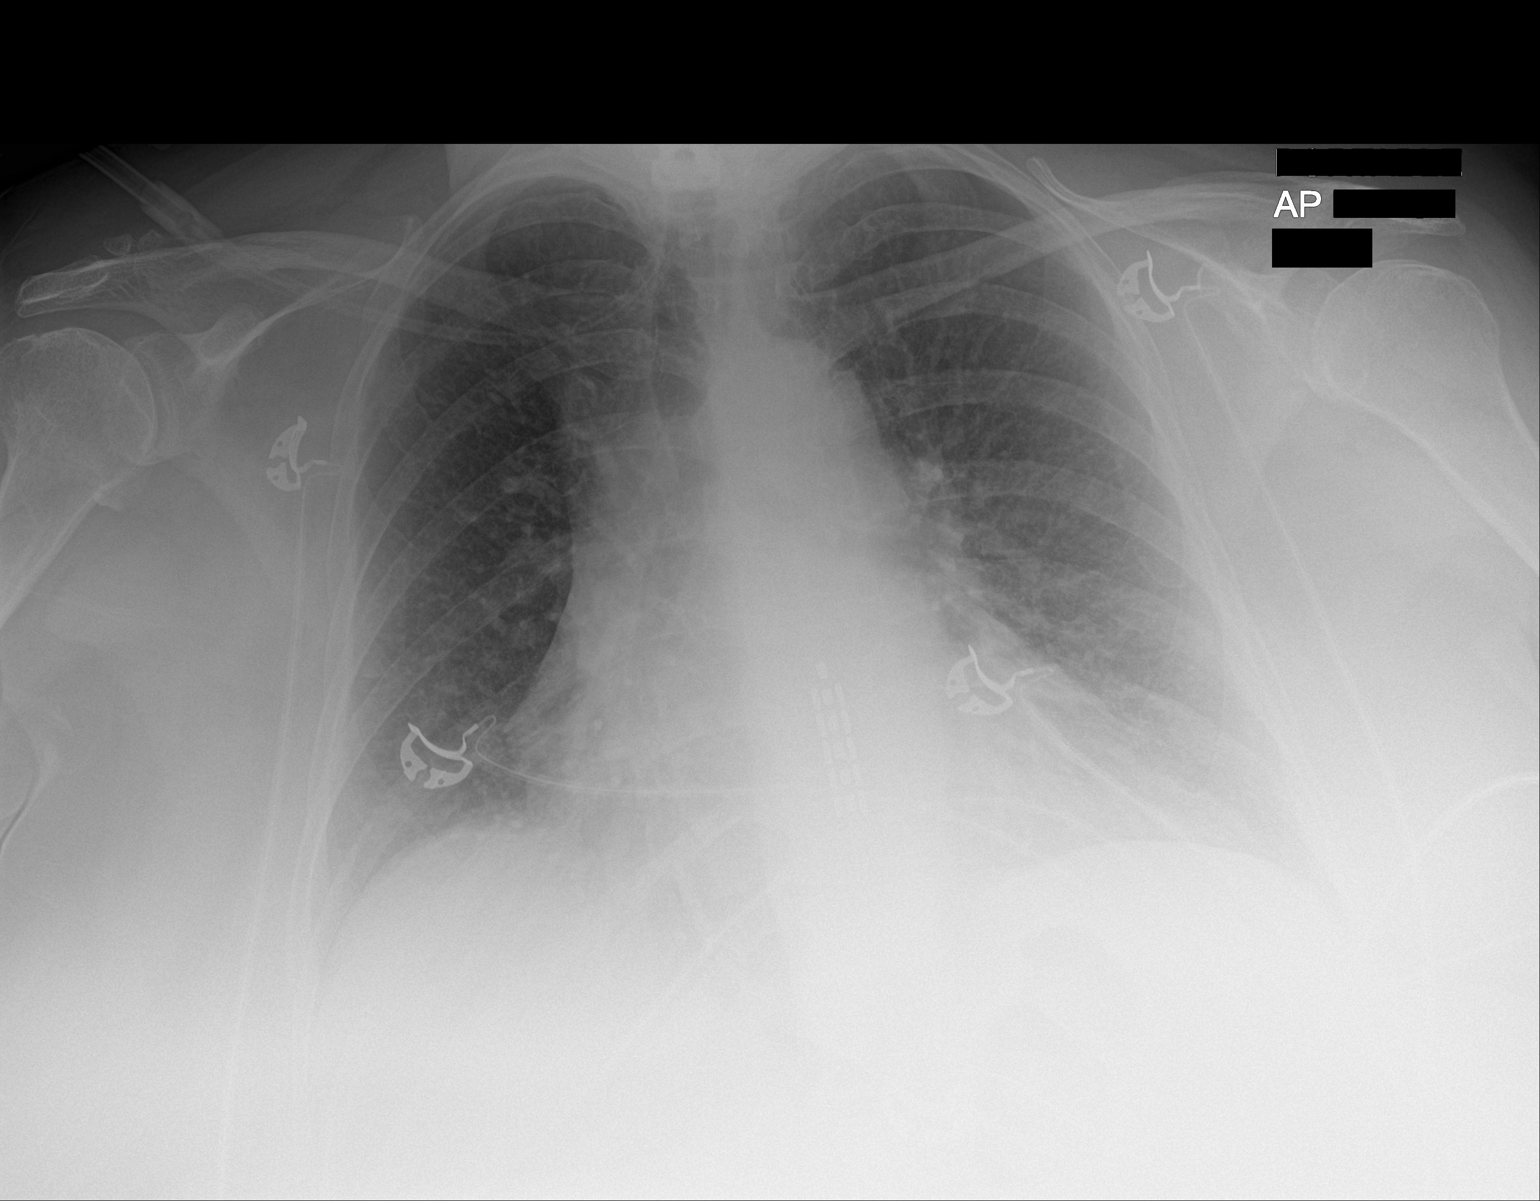

[1 of 1 positions shown; findings below may reference images not displayed]

FINDINGS: Minimal bibasilar atelectatic changes. No focal consolidation,
pleural effusion, or pneumothorax. The cardiac silhouette is within
normal limits. Spinal stimulator wire in stable positioning. Lower
cervical fixation plate and screws noted. No acute osseous
pathology.
IMPRESSION: 1. No acute cardiopulmonary process.
2. Stable cardiomegaly.

## 2017-08-14 ENCOUNTER — Ambulatory Visit
Payer: Medicare Other | Attending: Student in an Organized Health Care Education/Training Program | Admitting: Student in an Organized Health Care Education/Training Program

## 2017-08-14 ENCOUNTER — Encounter: Payer: Self-pay | Admitting: Student in an Organized Health Care Education/Training Program

## 2017-08-14 ENCOUNTER — Other Ambulatory Visit: Payer: Self-pay

## 2017-08-14 VITALS — BP 105/45 | HR 87 | Temp 98.3°F | Resp 16 | Ht 63.0 in | Wt 230.0 lb

## 2017-08-14 DIAGNOSIS — Z6841 Body Mass Index (BMI) 40.0 and over, adult: Secondary | ICD-10-CM | POA: Diagnosis not present

## 2017-08-14 DIAGNOSIS — M170 Bilateral primary osteoarthritis of knee: Secondary | ICD-10-CM | POA: Insufficient documentation

## 2017-08-14 DIAGNOSIS — M797 Fibromyalgia: Secondary | ICD-10-CM | POA: Diagnosis not present

## 2017-08-14 DIAGNOSIS — G2581 Restless legs syndrome: Secondary | ICD-10-CM | POA: Insufficient documentation

## 2017-08-14 DIAGNOSIS — E10649 Type 1 diabetes mellitus with hypoglycemia without coma: Secondary | ICD-10-CM | POA: Diagnosis not present

## 2017-08-14 DIAGNOSIS — Z9889 Other specified postprocedural states: Secondary | ICD-10-CM | POA: Diagnosis not present

## 2017-08-14 DIAGNOSIS — Z87891 Personal history of nicotine dependence: Secondary | ICD-10-CM | POA: Diagnosis not present

## 2017-08-14 DIAGNOSIS — M4698 Unspecified inflammatory spondylopathy, sacral and sacrococcygeal region: Secondary | ICD-10-CM | POA: Diagnosis not present

## 2017-08-14 DIAGNOSIS — Z794 Long term (current) use of insulin: Secondary | ICD-10-CM | POA: Diagnosis not present

## 2017-08-14 DIAGNOSIS — Z8673 Personal history of transient ischemic attack (TIA), and cerebral infarction without residual deficits: Secondary | ICD-10-CM | POA: Diagnosis not present

## 2017-08-14 DIAGNOSIS — G894 Chronic pain syndrome: Secondary | ICD-10-CM | POA: Insufficient documentation

## 2017-08-14 DIAGNOSIS — M47818 Spondylosis without myelopathy or radiculopathy, sacral and sacrococcygeal region: Secondary | ICD-10-CM

## 2017-08-14 DIAGNOSIS — Z79899 Other long term (current) drug therapy: Secondary | ICD-10-CM | POA: Insufficient documentation

## 2017-08-14 DIAGNOSIS — K7581 Nonalcoholic steatohepatitis (NASH): Secondary | ICD-10-CM | POA: Insufficient documentation

## 2017-08-14 DIAGNOSIS — F319 Bipolar disorder, unspecified: Secondary | ICD-10-CM | POA: Diagnosis not present

## 2017-08-14 DIAGNOSIS — M25471 Effusion, right ankle: Secondary | ICD-10-CM | POA: Diagnosis not present

## 2017-08-14 DIAGNOSIS — K7460 Unspecified cirrhosis of liver: Secondary | ICD-10-CM | POA: Insufficient documentation

## 2017-08-14 DIAGNOSIS — G4733 Obstructive sleep apnea (adult) (pediatric): Secondary | ICD-10-CM | POA: Diagnosis not present

## 2017-08-14 DIAGNOSIS — Z9049 Acquired absence of other specified parts of digestive tract: Secondary | ICD-10-CM | POA: Diagnosis not present

## 2017-08-14 DIAGNOSIS — Z881 Allergy status to other antibiotic agents status: Secondary | ICD-10-CM | POA: Diagnosis not present

## 2017-08-14 DIAGNOSIS — Z981 Arthrodesis status: Secondary | ICD-10-CM

## 2017-08-14 DIAGNOSIS — Z90710 Acquired absence of both cervix and uterus: Secondary | ICD-10-CM | POA: Diagnosis not present

## 2017-08-14 NOTE — Progress Notes (Signed)
Safety precautions to be maintained throughout the outpatient stay will include: orient to surroundings, keep bed in low position, maintain call bell within reach at all times, provide assistance with transfer out of bed and ambulation.  

## 2017-08-14 NOTE — Progress Notes (Signed)
Patient's Name: Stephanie [name]  MRN: [id number]  Referring Provider: Sarajane Jews, MD  DOB: [birth year]  PCP: Sarajane Jews, MD  DOS: 08/14/2017  Note by: Gillis Santa, MD  Service setting: Ambulatory outpatient  Specialty: Interventional Pain Management  Location: [facility] (AMB) Pain Management Facility    Patient type: Established   Primary Reason(s) for Visit: Encounter for post-procedure evaluation of chronic illness with mild to moderate exacerbation CC: Knee Pain (right)  HPI  Stephanie [name] is a 67 y.o. year old, female patient, who comes today for a post-procedure evaluation. She has UNSPECIFIED VITAMIN D DEFICIENCY; HLD (hyperlipidemia); TOBACCO ABUSE; OSA (obstructive sleep apnea); RESTLESS LEGS SYNDROME; Essential hypertension; Asthma; BACK PAIN, LUMBAR, CHRONIC; INSOMNIA; UNS ADVRS EFF OTH RX MEDICINAL&BIOLOGICAL SBSTNC; BIPOLAR AFFECTIVE DISORDER, HX OF; Aortic dissection ([place]); Anxiety; Lower extremity edema; Cirrhosis of liver ([place]); Varicose veins of left lower extremity; GERD (gastroesophageal reflux disease); Dyspnea; Lung nodule; DNR (do not resuscitate); Encounter for routine gynecological examination; Poorly controlled type 2 diabetes mellitus with complication ([place]); Dizziness and giddiness; DOE (dyspnea on exertion); Liver cirrhosis secondary to NASH ([place]); Abdominal pain, chronic, epigastric; Thrombocytopenia ([place]); Neuropathy; Hyperglycemia; Hypoglycemia; Iron deficiency anemia; Chest pain; Pneumonia; Pain in limb; Swelling of limb; Postphlebitic syndrome with inflammation; Lymphedema; Encephalopathy; Bilateral carotid artery stenosis; Degenerative tear of lateral meniscus of right knee; Degenerative tear of medial meniscus of right knee; Diabetes mellitus type 1, uncontrolled, without complications ([place]); Primary osteoarthritis of right knee; Altered mental status; Elevated alkaline phosphatase level; and Sepsis ([place]) on their problem list. Her primarily concern today is the Knee Pain  (right)  Pain Assessment: Location: Right Knee Radiating:   Onset: More than a month ago Duration: Chronic pain Quality: Pins and needles, Sharp, Constant Severity: 9 /10 (self-reported pain score)  Note: Reported level is inconsistent with clinical observations. Clinically the patient looks like a                   When using our objective Pain Scale, levels between 6 and 10/10 are said to belong in an emergency room, as it progressively worsens from a 6/10, described as severely limiting, requiring emergency care not usually available at an outpatient pain management facility. At a 6/10 level, communication becomes difficult and requires great effort. Assistance to reach the emergency department may be required. Facial flushing and profuse sweating along with potentially dangerous increases in heart rate and blood pressure will be evident. Effect on ADL:   Timing: Constant Modifying factors: medications  Stephanie [name] comes in today for post-procedure evaluation after the treatment done on 07/17/2017.  Patient follows up after her bilateral SI joint injections.  She states that those were helpful for her buttock and groin pain.  She states that her right leg is very swollen and painful.  She went to go see her primary care physician last week.  She finds it very difficult to walk on her right leg.  Further details on both, my assessment(s), as well as the proposed treatment plan, please see below.  Post-Procedure Assessment  07/17/2017 Procedure: Bilateral sacroiliac joint injection Pre-procedure pain score:  5/10 Post-procedure pain score: 0/10         Influential Factors: BMI: 40.74 kg/m Intra-procedural challenges: None observed.         Assessment challenges: None detected.              Reported side-effects: None.        Post-procedural adverse reactions or complications: None reported  Sedation: Please see nurses note. When no sedatives are used, the analgesic levels  obtained are directly associated to the effectiveness of the local anesthetics. However, when sedation is provided, the level of analgesia obtained during the initial 1 hour following the intervention, is believed to be the result of a combination of factors. These factors may include, but are not limited to: 1. The effectiveness of the local anesthetics used. 2. The effects of the analgesic(s) and/or anxiolytic(s) used. 3. The degree of discomfort experienced by the patient at the time of the procedure. 4. The patients ability and reliability in recalling and recording the events. 5. The presence and influence of possible secondary gains and/or psychosocial factors. Reported result: Relief experienced during the 1st hour after the procedure: 0 % (Ultra-Short Term Relief)            Interpretative annotation: Clinically appropriate result. Analgesia during this period is likely to be Local Anesthetic and/or IV Sedative (Analgesic/Anxiolytic) related.          Effects of local anesthetic: The analgesic effects attained during this period are directly associated to the localized infiltration of local anesthetics and therefore cary significant diagnostic value as to the etiological location, or anatomical origin, of the pain. Expected duration of relief is directly dependent on the pharmacodynamics of the local anesthetic used. Long-acting (4-6 hours) anesthetics used.  Reported result: Relief during the next 4 to 6 hour after the procedure: 0 % (Short-Term Relief)            Interpretative annotation: Unexpected result. Analgesia during this period is likely to be Local Anesthetic-related.          Long-term benefit: Defined as the period of time past the expected duration of local anesthetics (1 hour for short-acting and 4-6 hours for long-acting). With the possible exception of prolonged sympathetic blockade from the local anesthetics, benefits during this period are typically attributed to, or  associated with, other factors such as analgesic sensory neuropraxia, antiinflammatory effects, or beneficial biochemical changes provided by agents other than the local anesthetics.  Reported result: Extended relief following procedure: 100 % (Long-Term Relief)            Interpretative annotation: Clinically appropriate result. Good relief. No permanent benefit expected. Inflammation plays a part in the etiology to the pain.          Current benefits: Defined as reported results that persistent at this point in time.   Analgesia: 50-75 %            Function: Somewhat improved ROM: Somewhat improved Interpretative annotation: Ongoing benefit. No permanent benefit expected. Effective diagnostic intervention.          Interpretation: Results would suggest a successful diagnostic intervention.                  Plan:  Please see "Plan of Care" for details.                Laboratory Chemistry  Inflammation Markers (CRP: Acute Phase) (ESR: Chronic Phase) Lab Results  Component Value Date   ESRSEDRATE 71 (H) 04/20/2016   LATICACIDVEN 1.1 07/20/2017                         Rheumatology Markers Lab Results  Component Value Date   ANA POS (A) 03/19/2012                Renal Function Markers Lab Results  Component Value Date  BUN 9 07/21/2017   CREATININE 0.67 07/21/2017   GFRAA >60 07/21/2017   GFRNONAA >60 07/21/2017                 Hepatic Function Markers Lab Results  Component Value Date   AST 25 07/19/2017   ALT 19 07/19/2017   ALBUMIN 3.2 (L) 07/19/2017   ALKPHOS 143 (H) 07/19/2017   HCVAB NEGATIVE 02/03/2012   AMYLASE 30 06/12/2014   LIPASE 17 12/09/2015   AMMONIA 45 (H) 07/21/2017                 Electrolytes Lab Results  Component Value Date   NA 144 07/21/2017   K 4.1 07/21/2017   CL 112 (H) 07/21/2017   CALCIUM 8.8 (L) 07/21/2017   MG 2.1 07/21/2017   PHOS 3.7 07/21/2017                        Neuropathy Markers Lab Results  Component Value Date    VITAMINB12 431 11/13/2015   FOLATE 29.0 11/13/2015   HGBA1C 5.2 07/20/2016                 Bone Pathology Markers Lab Results  Component Value Date   VD25OH 30 11/12/2012   VD125OH2TOT 10 (L) 06/19/2008                         Coagulation Parameters Lab Results  Component Value Date   INR 1.00 03/07/2017   LABPROT 13.1 03/07/2017   APTT 37 (H) 03/07/2017   PLT 108 (L) 07/20/2017                 Cardiovascular Markers Lab Results  Component Value Date   BNP 108.0 (H) 07/28/2016   CKTOTAL 119 06/30/2012   CKMB 0.6 06/30/2012   TROPONINI <0.03 07/19/2017   HGB 11.8 (L) 07/20/2017   HCT 34.8 (L) 07/20/2017                 CA Markers No results found for: CEA, CA125, LABCA2               Note: Lab results reviewed.  Recent Diagnostic Imaging Results  DG Pelvis 1-2 Views CLINICAL DATA:  67 year old female with altered mental status, unresponsive.  EXAM: PELVIS - 1-2 VIEW  COMPARISON:  CT Abdomen and Pelvis 04/23/2017  FINDINGS: Femoral heads are normally located. Hip joint spaces remain normal. Proximal femurs appear intact. The pelvis appears stable and intact. Negative SI joints. Chronic postoperative changes to the lower lumbar spine including L3-L4 transpedicular hardware. Negative visible bowel gas pattern.  IMPRESSION: No acute findings.  Electronically Signed   By: Genevie Ann M.D.   On: 07/19/2017 23:48 DG Chest Port 1 View CLINICAL DATA:  67 year old female with altered mental status, unresponsive.  EXAM: PORTABLE CHEST 1 VIEW  COMPARISON:  Chest CT 07/07/2017, chest radiographs 06/16/2017, and earlier.  FINDINGS: Portable AP upright view at 2320 hrs. Large body habitus. Mildly lower lung volumes. Mildly increased hypo ventilation at both lung bases, but no pneumothorax or pleural effusion. No definite pulmonary edema. Stable cardiac size and mediastinal contours. Stable lower thoracic spinal stimulator device and prior cervical ACDF. Paucity  of bowel gas in the upper abdomen.  IMPRESSION: Large body habitus with lower lung volumes and mildly increased lung base atelectasis compared to radiographs in January.  Electronically Signed   By: Genevie Ann M.D.   On: 07/19/2017 23:46  CT Head Wo Contrast CLINICAL DATA:  67 year old female with altered mental status, unresponsive.  EXAM: CT HEAD WITHOUT CONTRAST  TECHNIQUE: Contiguous axial images were obtained from the base of the skull through the vertex without intravenous contrast.  COMPARISON:  Head CT 04/23/2017 and earlier.  FINDINGS: Brain: Cerebral volume is stable and normal for age. No midline shift, ventriculomegaly, mass effect, evidence of mass lesion, intracranial hemorrhage or evidence of cortically based acute infarction. Gray-white matter differentiation is within normal limits throughout the brain. No cortical encephalomalacia identified.  Vascular: Calcified atherosclerosis at the skull base. No suspicious intracranial vascular hyperdensity.  Skull: Negative.  Sinuses/Orbits: There is a right nasopharyngeal airway in place. Small volume retained secretions in the right nasal cavity and nasopharynx.  Visualized paranasal sinuses and mastoids are stable and well pneumatized.  Other: No acute orbit or scalp soft tissue findings.  IMPRESSION: 1. Stable and normal for age non contrast CT appearance of the brain. 2. Right nasopharyngeal airway in place.  Electronically Signed   By: Genevie Ann M.D.   On: 07/19/2017 23:28  Complexity Note: Imaging results reviewed. Results shared with Stephanie [name], using [employer].                         Meds   Current Outpatient Medications:  .  budesonide-formoterol (SYMBICORT) 160-4.5 MCG/ACT inhaler, Inhale 2 puffs into the lungs 2 (two) times daily. (0800 & 2000), Disp: , Rfl:  .  clonazePAM (KLONOPIN) 0.25 MG disintegrating tablet, Take 0.25 mg by mouth 3 (three) times daily., Disp: , Rfl:  .   dicyclomine (BENTYL) 10 MG capsule, Take 10 mg by mouth 3 (three) times daily. (0800, 1300 & 2000), Disp: , Rfl:  .  ferrous sulfate 325 (65 FE) MG tablet, Take 325 mg by mouth 2 (two) times daily. (0800 & 2000), Disp: , Rfl:  .  furosemide (LASIX) 80 MG tablet, Take 80 mg by mouth daily. (0800), Disp: , Rfl:  .  gabapentin (NEURONTIN) 600 MG tablet, Take 600 mg by mouth 2 (two) times daily. (0800 and 2000), Disp: , Rfl:  .  insulin lispro (HUMALOG KWIKPEN) 100 UNIT/ML KiwkPen, Inject 0-12 Units into the skin 3 (three) times daily. Check FSBS before each meal & cover with SSI (sliding scale insulin) , Disp: , Rfl:  .  lactulose (CHRONULAC) 10 GM/15ML solution, Take 10 g by mouth daily. (0800), Disp: , Rfl:  .  lamoTRIgine (LAMICTAL) 100 MG tablet, Take 100 mg by mouth 2 (two) times daily. (0800 & 2000), Disp: , Rfl:  .  LYRICA 150 MG capsule, Take 150 mg by mouth 3 (three) times daily. (0800, 1400 & 2000), Disp: , Rfl:  .  metoCLOPramide (REGLAN) 5 MG tablet, Take 5 mg by mouth 4 (four) times daily -  before meals and at bedtime. (0800, 1200, 1600 & 2000), Disp: , Rfl:  .  nystatin (MYCOSTATIN/NYSTOP) powder, Apply topically 4 (four) times daily., Disp: 15 g, Rfl: 0 .  omeprazole (PRILOSEC) 20 MG capsule, Take 20 mg by mouth daily. , Disp: , Rfl:  .  oxybutynin (DITROPAN-XL) 5 MG 24 hr tablet, Take 5 mg by mouth daily. (0800), Disp: , Rfl:  .  rOPINIRole (REQUIP) 3 MG tablet, Take 3 mg by mouth at bedtime. (2000), Disp: , Rfl:  .  simvastatin (ZOCOR) 10 MG tablet, Take 10 mg by mouth at bedtime. (2000), Disp: , Rfl:  .  spironolactone (ALDACTONE) 25 MG tablet, Take 25 mg  by mouth daily. (0800), Disp: , Rfl:  .  vitamin C (ASCORBIC ACID) 500 MG tablet, Take 500 mg by mouth 2 (two) times daily. (0800 & 2000), Disp: , Rfl:  .  zolpidem (AMBIEN) 5 MG tablet, Take 5 mg by mouth at bedtime. (2000), Disp: , Rfl:   ROS  Constitutional: Denies any fever or chills Gastrointestinal: No reported hemesis,  hematochezia, vomiting, or acute GI distress Musculoskeletal: Denies any acute onset joint swelling, redness, loss of ROM, or weakness Neurological: No reported episodes of acute onset apraxia, aphasia, dysarthria, agnosia, amnesia, paralysis, loss of coordination, or loss of consciousness  Allergies  Stephanie [name] is allergic to doxycycline.  [place]  Drug: Stephanie [name]  reports that she does not use drugs. Alcohol:  reports that she does not drink alcohol. Tobacco:  reports that she quit smoking about 4 years ago. Her smoking use included cigarettes. She has a 75.00 pack-year smoking history. she has never used smokeless tobacco. Medical:  has a past medical history of Adenomatous colon polyp (08/27/2014), Anemia, Anxiety, Aortic dissection ([place]), Arthritis, Asthma, Bipolar affective disorder ([place]), Blood transfusion (2000), Blood transfusion without reported diagnosis, Cancer ([place]), Chronic abdominal pain, Cirrhosis ([place]), Colon polyp, Depression, Diabetes mellitus without complication ([place]), Fatty liver (04/09/08), GERD (gastroesophageal reflux disease), H/O: CVA (cardiovascular accident), H/O: rheumatic fever, Hepatitis, Hyperlipidemia, Incontinence, Insomnia, Lower extremity edema, Obstructive sleep apnea, Platelets decreased ([place]), RLS (restless legs syndrome), Shortness of breath, Sleep apnea, Stroke ([place]), Ulcer, and Unspecified disorders of nervous system. Surgical: Stephanie [name]  has a past surgical history that includes Cholecystectomy; Rotator cuff repair; Abdominal hysterectomy; Lumbar fusion (10/09); Hemorroidectomy; Abdominal exploration surgery; Posterior cervical fusion/foraminotomy; Lumbar wound debridement (05/09/2011); Axillary artery cannulation via 8-mm Hemashield graft, median sternotomy, extracorporeal circulation with deep hypothermic circulatory arrest, repair of  aortic dessection (01/23/2009); cataract; Colonoscopy; Liver biopsy (04/10/2012); Back surgery; Eye surgery (Bilateral); and  Knee arthroscopy with lateral release (Right, 11/15/2016). Family: family history includes Breast cancer in her mother; Diabetes in her unknown relative; Lung cancer in her mother; Stomach cancer in her father.  Constitutional Exam  General appearance: Well nourished, well developed, and well hydrated. In no apparent acute distress Vitals:   08/14/17 0911  BP: (!) 105/45  Pulse: 87  Resp: 16  Temp: 98.3 F (36.8 C)  TempSrc: Oral  SpO2: 95%  Weight: 230 lb (104.3 kg)  Height: _0  (1.6 m)   BMI Assessment: Estimated body mass index is 40.74 kg/m as calculated from the following:   Height as of this encounter: _1  (1.6 m).   Weight as of this encounter: 230 lb (104.3 kg).  BMI interpretation table: BMI level Category Range association with higher incidence of chronic pain  <18 kg/m2 Underweight   18.5-24.9 kg/m2 Ideal body weight   25-29.9 kg/m2 Overweight Increased incidence by 20%  30-34.9 kg/m2 Obese (Class I) Increased incidence by 68%  35-39.9 kg/m2 Severe obesity (Class II) Increased incidence by 136%  >40 kg/m2 Extreme obesity (Class III) Increased incidence by 254%   BMI Readings from Last 4 Encounters:  08/14/17 40.74 kg/m  08/07/17 40.29 kg/m  07/25/17 41.17 kg/m  07/19/17 39.86 kg/m   Wt Readings from Last 4 Encounters:  08/14/17 230 lb (104.3 kg)  08/07/17 227 lb 7 oz (103.2 kg)  07/25/17 232 lb 6.4 oz (105.4 kg)  07/19/17 225 lb (102.1 kg)  Psych/Mental status: Alert, oriented x 3 (person, place, & time)       Eyes: PERLA Respiratory: No evidence of acute respiratory  distress  Cervical Spine Area Exam  Skin & Axial Inspection: No masses, redness, edema, swelling, or associated skin lesions Alignment: Symmetrical Functional ROM: Unrestricted ROM      Stability: No instability detected Muscle Tone/Strength: Functionally intact. No obvious neuro-muscular anomalies detected. Sensory (Neurological): Unimpaired Palpation: No palpable anomalies               Upper Extremity (UE) Exam    Side: Right upper extremity  Side: Left upper extremity  Skin & Extremity Inspection: Skin color, temperature, and hair growth are WNL. No peripheral edema or cyanosis. No masses, redness, swelling, asymmetry, or associated skin lesions. No contractures.  Skin & Extremity Inspection: Skin color, temperature, and hair growth are WNL. No peripheral edema or cyanosis. No masses, redness, swelling, asymmetry, or associated skin lesions. No contractures.  Functional ROM: Unrestricted ROM          Functional ROM: Unrestricted ROM          Muscle Tone/Strength: Functionally intact. No obvious neuro-muscular anomalies detected.  Muscle Tone/Strength: Functionally intact. No obvious neuro-muscular anomalies detected.  Sensory (Neurological): Unimpaired          Sensory (Neurological): Unimpaired          Palpation: No palpable anomalies              Palpation: No palpable anomalies              Specialized Test(s): Deferred         Specialized Test(s): Deferred          Thoracic Spine Area Exam  Skin & Axial Inspection: No masses, redness, or swelling Alignment: Symmetrical Functional ROM: Unrestricted ROM Stability: No instability detected Muscle Tone/Strength: Functionally intact. No obvious neuro-muscular anomalies detected. Sensory (Neurological): Unimpaired Muscle strength & Tone: No palpable anomalies  Lumbar Spine Area Exam  Skin & Axial Inspection: No masses, redness, or swelling Alignment: Symmetrical Functional ROM: Unrestricted ROM      Stability: No instability detected Muscle Tone/Strength: Functionally intact. No obvious neuro-muscular anomalies detected. Sensory (Neurological): Unimpaired Palpation: No palpable anomalies       Provocative Tests: Lumbar Hyperextension and rotation test: evaluation deferred today       Lumbar Lateral bending test: evaluation deferred today       Patrick's Maneuver: Improved after treatment                    Gait  & Posture Assessment  Ambulation: Limited Gait: Antalgic Posture: Difficulty standing up straight, due to pain   Lower Extremity Exam    Side: Right lower extremity  Side: Left lower extremity  Skin & Extremity Inspection: Edema, redness  Skin & Extremity Inspection: Skin color, temperature, and hair growth are WNL. No peripheral edema or cyanosis. No masses, redness, swelling, asymmetry, or associated skin lesions. No contractures.  Functional ROM: Decreased ROM for knee joint and ankle  Functional ROM: Unrestricted ROM          Muscle Tone/Strength: Functionally intact. No obvious neuro-muscular anomalies detected.  Muscle Tone/Strength: Functionally intact. No obvious neuro-muscular anomalies detected.  Sensory (Neurological): Musculoskeletal pain pattern  Sensory (Neurological): Unimpaired  Palpation: Complains of area being tender to palpation  Palpation: No palpable anomalies   Assessment  Primary Diagnosis & Pertinent Problem List: The primary encounter diagnosis was Right ankle swelling. 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  Status Diagnosis  Worsening Responding Persistent 1. Right ankle swelling   2. 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 Fibromyalgia   4. S/P lumbar fusion   5. Chronic pain syndrome   6. Primary osteoarthritis of both knees   7. Morbid obesity ([place])      General Recommendations: The pain condition that the patient suffers from is best treated with a multidisciplinary approach that involves an increase in physical activity to prevent de-conditioning and worsening of the pain cycle, as well as psychological counseling (formal and/or informal) to address the co-morbid psychological affects of pain. Treatment will often involve judicious use of pain medications and interventional procedures to decrease the pain,  allowing the patient to participate in the physical activity that will ultimately produce long-lasting pain reductions. The goal of the multidisciplinary approach is to return the patient to a higher level of overall function and to restore their ability to perform activities of daily living.  Patient is a 67 year old female with multiple medical issues including history of type I aortic dissection, bipolar effective disorder, cirrhosis secondary to [facility], diabetes, hepatitis B over 20 years ago, history of CVA, obstructive sleep apnea, morbid obesity, restless leg syndrome who presents with multiple pain complaints including neck, bilateral arms, bilateral hands, thoracolumbar spine, hips, bilateral knees, bilateral ankles. Patient does have a history of fibromyalgia as well as 5 lumbar spine surgeries from 2011 2 2015.  Patient is no longer on any benzodiazepine or opioid therapy which I agree with.   Patient follows up after her bilateral SI joint injections.  She states that those were helpful for her buttock and groin pain.  Patient's right lower extremity from her knee distal is very painful to move, tender to touch, erythematous, warm to touch, with edematous changes.  There is also color change with a glossy appearing surface.  Patient is afebrile but my concern is for cellulitis versus compartment syndrome.  Patient does have palpable right dorsalis pedis pulse.  I recommended the patient go to urgent care where the ED immediately to have her right lower extremity evaluated possibly be put on antibiotics.  Plan: -Recommended patient go to ED or urgent care to address her right lower extremity, concern for cellulitis versus compartment syndrome -Continue Lyrica 150 mg 3 times a day for fibromyalgia   Considering:   -Bilateral genicular nerve block for bilateral knee osteoarthritis.   Provider-requested follow-up: Return in about 6 weeks (around 09/25/2017). Time Note: Greater than 50% of the  15 minute(s) of face-to-face time spent with Stephanie [name], was spent in counseling/coordination of care regarding: right leg swelling/redness, the treatment plan, treatment alternatives and the results, interpretation and significance of  her recent diagnostic interventional treatment(s). Future Appointments  Date Time Provider [place]  08/24/2017  1:00 PM Ursula Alert, MD [facility] None  09/25/2017  1:30 PM Gillis Santa, MD [facility] None  11/06/2017 10:45 AM Lequita Asal, MD [facility] None  07/03/2018 10:15 AM Dew, Erskine Squibb, MD [facility] None    Primary Care Physician: Sarajane Jews, MD Location: [facility] Facility Note by: Gillis Santa, M.D Date: 08/14/2017; Time: 11:01 AM  Patient Instructions  Please got to ED or urgent care for right ankle swelling and redness, concern for cellulitis

## 2017-08-14 NOTE — Patient Instructions (Signed)
Please got to ED or urgent care for right ankle swelling and redness, concern for cellulitis

## 2017-08-15 ENCOUNTER — Emergency Department
Admission: EM | Admit: 2017-08-15 | Discharge: 2017-08-15 | Disposition: A | Discharge status: Home / Self Care | Payer: Medicare Other | Attending: Emergency Medicine | Admitting: Emergency Medicine

## 2017-08-15 ENCOUNTER — Emergency Department: Payer: Medicare Other

## 2017-08-15 ENCOUNTER — Other Ambulatory Visit: Payer: Self-pay

## 2017-08-15 ENCOUNTER — Encounter: Payer: Self-pay | Admitting: Emergency Medicine

## 2017-08-15 DIAGNOSIS — R079 Chest pain, unspecified: Secondary | ICD-10-CM | POA: Diagnosis not present

## 2017-08-15 DIAGNOSIS — R51 Headache: Secondary | ICD-10-CM | POA: Diagnosis present

## 2017-08-15 DIAGNOSIS — I10 Essential (primary) hypertension: Secondary | ICD-10-CM | POA: Insufficient documentation

## 2017-08-15 DIAGNOSIS — J45909 Unspecified asthma, uncomplicated: Secondary | ICD-10-CM | POA: Diagnosis not present

## 2017-08-15 DIAGNOSIS — Z79899 Other long term (current) drug therapy: Secondary | ICD-10-CM | POA: Insufficient documentation

## 2017-08-15 DIAGNOSIS — E876 Hypokalemia: Secondary | ICD-10-CM | POA: Diagnosis not present

## 2017-08-15 DIAGNOSIS — E109 Type 1 diabetes mellitus without complications: Secondary | ICD-10-CM | POA: Insufficient documentation

## 2017-08-15 DIAGNOSIS — Z87891 Personal history of nicotine dependence: Secondary | ICD-10-CM | POA: Insufficient documentation

## 2017-08-15 DIAGNOSIS — Z794 Long term (current) use of insulin: Secondary | ICD-10-CM | POA: Diagnosis not present

## 2017-08-15 DIAGNOSIS — R102 Pelvic and perineal pain: Secondary | ICD-10-CM | POA: Insufficient documentation

## 2017-08-15 DIAGNOSIS — W19XXXA Unspecified fall, initial encounter: Secondary | ICD-10-CM

## 2017-08-15 LAB — BASIC METABOLIC PANEL
ANION GAP: 9 (ref 5–15)
BUN: 10 mg/dL (ref 6–20)
CALCIUM: 9 mg/dL (ref 8.9–10.3)
CHLORIDE: 103 mmol/L (ref 101–111)
CO2: 29 mmol/L (ref 22–32)
Creatinine, Ser: 0.74 mg/dL (ref 0.44–1.00)
GFR calc non Af Amer: >60 mL/min (ref >60)
GLUCOSE: 111 mg/dL — AB (ref 65–99)
POTASSIUM: 2.8 mmol/L — AB (ref 3.5–5.1)
Sodium: 141 mmol/L (ref 135–145)

## 2017-08-15 LAB — CBC
HEMATOCRIT: 36.3 % (ref 35.0–47.0)
HEMOGLOBIN: 12.4 g/dL (ref 12.0–16.0)
MCH: 31.5 pg (ref 26.0–34.0)
MCHC: 34 g/dL (ref 32.0–36.0)
MCV: 92.5 fL (ref 80.0–100.0)
Platelets: 135 10*3/uL — ABNORMAL LOW (ref 150–440)
RBC: 3.93 MIL/uL (ref 3.80–5.20)
RDW: 16.1 % — ABNORMAL HIGH (ref 11.5–14.5)
WBC: 7.1 10*3/uL (ref 3.6–11.0)

## 2017-08-15 MED ORDER — IOPAMIDOL (ISOVUE-300) INJECTION 61%
100.0000 mL | Freq: Once | INTRAVENOUS | Status: AC | PRN
  Administered 2017-08-15: 100 mL via INTRAVENOUS

## 2017-08-15 MED ORDER — POTASSIUM CHLORIDE CRYS ER 10 MEQ PO TBCR: 10.0000 meq | EXTENDED_RELEASE_TABLET | Freq: Every day | ORAL | 0 refills | Status: DC

## 2017-08-15 NOTE — Discharge Instructions (Signed)
Please follow-up with your doctor for recheck of your labs including potassium level within the next several days.  Your workup including CT scans of your head, neck, chest abdomen and pelvis showed no acute injuries.

## 2017-08-15 NOTE — ED Notes (Signed)
Patient transported to CT 

## 2017-08-15 NOTE — ED Notes (Signed)
[facility] called for transport back to facility [place]

## 2017-08-15 NOTE — ED Provider Notes (Addendum)
[facility] Emergency Department Provider Note  Time seen: 3:37 PM  I have reviewed the triage vital signs and the nursing notes.   HISTORY  Chief Complaint Fall    HPI Stephanie [name] is a 67 y.o. female with a past medical history of anemia, anxiety, bipolar, cirrhosis, diabetes, depression, gastric reflux, hyperlipidemia, prior CVA, presents to the emergency department after a fall.  According to the patient she was walking at her nursing facility when she tripped falling forwards landing on her head.  Patient is complaining of head pain, neck pain, chest pain, abdominal pain and back pain.  C-collar in place currently.  Patient able to move all extremities but does state left shoulder pain with attempted movement of the left arm.  Patient recalls the entire fall.  Denies passing out.  Denies vomiting.  Largely negative review of systems otherwise.  Past Medical History:  Diagnosis Date  . Adenomatous colon polyp 08/27/2014   Polyps x 3  . Anemia   . Anxiety   . 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 Arthritis   . Asthma   . Bipolar affective disorder ([place])    h/o  . Blood transfusion 2000  . Blood transfusion without reported diagnosis   . Cancer ([place])   . Chronic abdominal pain    abdominal wall pain  . 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 Colon polyp   . Depression   . Diabetes mellitus without complication ([place])   . Fatty liver 04/09/08   found in abd CT  . GERD (gastroesophageal reflux disease)   . H/O: CVA (cardiovascular accident)    TIA  . H/O: rheumatic fever   . Hepatitis    HEP "B" twenty years ago  . Hyperlipidemia   . Incontinence   . Insomnia   . Lower extremity edema   . Obstructive sleep apnea    Use C-PAP twice per week  . Platelets decreased ([place])   . RLS (restless legs syndrome)   . Shortness of breath   . Sleep apnea   . Stroke [facility])    TIA- 2002  . Ulcer   . Unspecified disorders of nervous system     Patient  Active Problem List   Diagnosis Date Noted  . Sepsis ([place]) 07/20/2017  . Elevated alkaline phosphatase level 04/16/2017  . Altered mental status 03/07/2017  . Degenerative tear of lateral meniscus of right knee 11/15/2016  . Encephalopathy 07/19/2016  . Degenerative tear of medial meniscus of right knee 07/01/2016  . Primary osteoarthritis of right knee 07/01/2016  . Diabetes mellitus type 1, uncontrolled, without complications ([name]) 41/66/0630  . Lymphedema 06/14/2016  . Pain in limb 05/06/2016  . Swelling of limb 05/06/2016  . Postphlebitic syndrome with inflammation 05/06/2016  . Pneumonia 04/18/2016  . Chest pain 04/04/2016  . Iron deficiency anemia 01/30/2016  . Bilateral carotid artery stenosis 01/12/2016  . Hypoglycemia 09/09/2015  . Hyperglycemia 07/16/2015  . Neuropathy 06/08/2015  . Thrombocytopenia ([place]) 08/19/2014  . Abdominal pain, chronic, epigastric 06/12/2014  . Liver cirrhosis secondary to NASH ([place]) 02/27/2014  . Dizziness and giddiness 10/30/2013  . DOE (dyspnea on exertion) 10/30/2013  . Poorly controlled type 2 diabetes mellitus with complication ([place]) 16/06/930  . DNR (do not resuscitate) 07/29/2013  . Encounter for routine gynecological examination 07/29/2013  . Dyspnea 07/10/2013  . Lung nodule 07/10/2013  . Varicose veins of left lower extremity 10/09/2012  . GERD (gastroesophageal reflux disease)  10/09/2012  . Lower extremity edema 02/02/2012  . Cirrhosis of liver ([place]) 02/02/2012  . Anxiety 07/11/2011  . Aortic dissection ([place])   . BACK PAIN, LUMBAR, CHRONIC 11/19/2009  . UNS ADVRS EFF OTH RX MEDICINAL&BIOLOGICAL SBSTNC 12/16/2008  . UNSPECIFIED VITAMIN D DEFICIENCY 09/26/2008  . TOBACCO ABUSE 06/19/2008  . RESTLESS LEGS SYNDROME 04/27/2007  . INSOMNIA 04/27/2007  . HLD (hyperlipidemia) 03/08/2007  . OSA (obstructive sleep apnea) 03/08/2007  . Essential hypertension 03/08/2007  . Asthma 03/08/2007  . BIPOLAR AFFECTIVE DISORDER, HX OF  03/08/2007    Past Surgical History:  Procedure Laterality Date  . ABDOMINAL EXPLORATION SURGERY    . ABDOMINAL HYSTERECTOMY     total  . Axillary artery cannulation via 8-mm Hemashield graft, median sternotomy, extracorporeal circulation with deep hypothermic circulatory arrest, repair of  aortic dessection  01/23/2009   Dr Arlyce Dice  . BACK SURGERY      x 5  . cataract     both eyes  . CHOLECYSTECTOMY    . COLONOSCOPY    . EYE SURGERY Bilateral    Cataract Extraction with IOL  . HEMORROIDECTOMY     and colon polyp removed  . KNEE ARTHROSCOPY WITH LATERAL RELEASE Right 11/15/2016   Procedure: KNEE ARTHROSCOPY WITH LATERAL RELEASE;  Surgeon: Corky Mull, MD;  Location: [facility] ORS;  Service: Orthopedics;  Laterality: Right;  . LIVER BIOPSY  04/10/2012   Procedure: LIVER BIOPSY;  Surgeon: Inda Castle, MD;  Location: [facility];  Service: Endoscopy;  Laterality: N/A;  ultrasound to mark order for abd limited/liver to be marked to be sent by [email or web address]   . LUMBAR FUSION  10/09  . LUMBAR WOUND DEBRIDEMENT  05/09/2011   Procedure: LUMBAR WOUND DEBRIDEMENT;  Surgeon: Dahlia Bailiff;  Location: [place];  Service: Orthopedics;  Laterality: N/A;  IRRIGATION AND DEBRIDEMENT SPINAL WOUND  . POSTERIOR CERVICAL FUSION/FORAMINOTOMY    . ROTATOR CUFF REPAIR     left    Prior to Admission medications   Medication Sig Start Date End Date Taking? Authorizing Provider  budesonide-formoterol (SYMBICORT) 160-4.5 MCG/ACT inhaler Inhale 2 puffs into the lungs 2 (two) times daily. (0800 & 2000)    [provider]  clonazePAM (KLONOPIN) 0.25 MG disintegrating tablet Take 0.25 mg by mouth 3 (three) times daily.    [provider]  dicyclomine (BENTYL) 10 MG capsule Take 10 mg by mouth 3 (three) times daily. (0800, 1300 & 2000)    [provider]  ferrous sulfate 325 (65 FE) MG tablet Take 325 mg by mouth 2 (two) times daily. (0800 & 2000)    [provider]  furosemide  (LASIX) 80 MG tablet Take 80 mg by mouth daily. (0800)    [provider]  gabapentin (NEURONTIN) 600 MG tablet Take 600 mg by mouth 2 (two) times daily. (0800 and 2000)    [provider]  insulin lispro (HUMALOG KWIKPEN) 100 UNIT/ML KiwkPen Inject 0-12 Units into the skin 3 (three) times daily. Check FSBS before each meal & cover with SSI (sliding scale insulin)     [provider]  lactulose (CHRONULAC) 10 GM/15ML solution Take 10 g by mouth daily. (0800) 10/17/16   [provider]  lamoTRIgine (LAMICTAL) 100 MG tablet Take 100 mg by mouth 2 (two) times daily. (0800 & 2000)    [provider]  LYRICA 150 MG capsule Take 150 mg by mouth 3 (three) times daily. (0800, 1400 & 2000) 11/29/16   [provider]  metoCLOPramide (  REGLAN) 5 MG tablet Take 5 mg by mouth 4 (four) times daily -  before meals and at bedtime. (0800, 1200, 1600 & 2000)    [provider]  nystatin (MYCOSTATIN/NYSTOP) powder Apply topically 4 (four) times daily. 08/07/17   Lequita Asal, MD  omeprazole (PRILOSEC) 20 MG capsule Take 20 mg by mouth daily.     [provider]  oxybutynin (DITROPAN-XL) 5 MG 24 hr tablet Take 5 mg by mouth daily. (0800)    [provider]  rOPINIRole (REQUIP) 3 MG tablet Take 3 mg by mouth at bedtime. (2000)    [provider]  simvastatin (ZOCOR) 10 MG tablet Take 10 mg by mouth at bedtime. (2000)    [provider]  spironolactone (ALDACTONE) 25 MG tablet Take 25 mg by mouth daily. (0800)    [provider]  vitamin C (ASCORBIC ACID) 500 MG tablet Take 500 mg by mouth 2 (two) times daily. (0800 & 2000)    [provider]  zolpidem (AMBIEN) 5 MG tablet Take 5 mg by mouth at bedtime. (2000)    [provider]    Allergies  Allergen Reactions  . Doxycycline Hives, Swelling, Other (See Comments) and Nausea Only    Reaction:  Facial swelling  Face red and swollen; no  difficulty breathing    Family History  Problem Relation Age of Onset  . Breast cancer Mother   . Lung cancer Mother   . Stomach cancer Father   . Diabetes Unknown        4 aunts, and 1 uncle  . Colon cancer Neg Hx   . Esophageal cancer Neg Hx   . Rectal cancer Neg Hx     Social History Social History   Tobacco Use  . Smoking status: Former Smoker    Packs/day: 1.50    Years: 50.00    Pack years: 75.00    Types: Cigarettes    Last attempt to quit: 04/06/2013    Years since quitting: 4.3  . Smokeless tobacco: Never Used  Substance Use Topics  . Alcohol use: No    Alcohol/week: 0.0 oz  . Drug use: No    Review of Systems Constitutional: Negative for loss of consciousness Eyes: Negative for visual complaints ENT: Negative for facial pain. Cardiovascular: Positive for chest wall pain. Respiratory: Negative for shortness of breath. Gastrointestinal: Positive for abdominal pain.  Negative for vomiting Genitourinary: Negative for urinary compaints Musculoskeletal: Positive for left shoulder pain Skin: Abrasion to forehead Neurological: Negative for headache All other ROS negative  ____________________________________________   PHYSICAL EXAM:  VITAL SIGNS: ED Triage Vitals  Enc Vitals Group     BP 08/15/17 1455 (!) 119/57     Pulse Rate 08/15/17 1455 93     Resp 08/15/17 1455 16     Temp 08/15/17 1455 98.2 F (36.8 C)     Temp Source 08/15/17 1455 Oral     SpO2 08/15/17 1455 96 %     Weight 08/15/17 1456 230 lb (104.3 kg)     Height 08/15/17 1456 5' 3"  (1.6 m)     Head Circumference --      Peak Flow --      Pain Score 08/15/17 1456 10     Pain Loc --      Pain Edu? --      Excl. in [place]? --    Constitutional: Alert and oriented.  No apparent distress.  C-collar in place. Eyes: Normal exam ENT  Head: Normocephalic and atraumatic.   Mouth/Throat: Mucous membranes are moist. Cardiovascular: Normal rate, regular rhythm.  States moderate chest wall  tenderness in all locations Respiratory: Normal respiratory effort without tachypnea nor retractions. Breath sounds are clear Gastrointestinal: Soft, mild fairly diffuse abdominal tenderness to palpation. Musculoskeletal: Left shoulder pain with palpation, mild pain elicited with range of motion.  Neurovascular intact distally.  Able to move all extremities but does state pelvis pain with movement of her lower extremities.  Mild C-spine tenderness to palpation.  C-collar in place.  Mild erythema of the right lower extremity with mild edema.  Patient recently started antibiotics to treat cellulitis of this extremity.  Exam overall consistent with very mild cellulitis. Neurologic:  Normal speech and language. No gross focal neurologic deficits.  Able to move all extremities. Skin:  Skin is warm, dry.  Mild erythema of the right lower extremity Psychiatric: Mood and affect are normal. Speech and behavior are normal.   ____________________________________________    RADIOLOGY  CT scans of the head, C-spine, chest abdomen and pelvis are negative for acute traumatic injury.  ____________________________________________   INITIAL IMPRESSION / ASSESSMENT AND PLAN / ED COURSE  Pertinent labs & imaging results that were available during my care of the patient were reviewed by me and considered in my medical decision making (see chart for details).  Patient presents emergency department after a fall from her nursing facility.  Patient is complaining of pain all over.  Difficult exam given complaints of pain with palpation literally almost everywhere touched.  Differential would include fractures, dislocations, blunt chest injury, blunt abdominal injuries, ICH.  Given the unreliable physical examination we will proceed with CT imaging of the head, neck, chest abdomen and pelvis to further evaluate.  Discussed this with the patient she is agreeable to this plan of care.  CT scans are largely negative.   Patient's labs are resulted largely within normal limits besides mild hypokalemia we will discharge on potassium supplementation for the next 7 days.  We will have the patient follow-up with her doctor for recheck/reevaluation.  We will ambulate in the emergency department prior to discharge.  ____________________________________________   FINAL CLINICAL IMPRESSION(S) / ED DIAGNOSES  Cheral Marker, MD 08/15/17 1716    Harvest Dark, MD 08/15/17 1722

## 2017-08-15 NOTE — ED Notes (Signed)
This RN ambulated pt with walker to and from room bathroom. O2 sat stayed at or above 92% during the duration.

## 2017-08-15 NOTE — ED Triage Notes (Addendum)
Pt arrives via [facility] from [facility] [place] s/p trip and fall. Pt reports her foot got stuck on her walker and she fell face first onto the "thinly carpeted" floor. Pt c/o left shoulder and arm pain, neck pain and head. No blood thinners.  Pt hx anxiety.

## 2017-08-24 ENCOUNTER — Encounter: Payer: Self-pay | Admitting: Psychiatry

## 2017-08-24 ENCOUNTER — Ambulatory Visit (INDEPENDENT_AMBULATORY_CARE_PROVIDER_SITE_OTHER): Payer: Medicare Other | Admitting: Psychiatry

## 2017-08-24 ENCOUNTER — Other Ambulatory Visit: Payer: Self-pay

## 2017-08-24 VITALS — BP 102/67 | HR 101 | Temp 97.7°F | Wt 229.6 lb

## 2017-08-24 DIAGNOSIS — G894 Chronic pain syndrome: Secondary | ICD-10-CM | POA: Diagnosis not present

## 2017-08-24 DIAGNOSIS — F5105 Insomnia due to other mental disorder: Secondary | ICD-10-CM | POA: Diagnosis not present

## 2017-08-24 DIAGNOSIS — F411 Generalized anxiety disorder: Secondary | ICD-10-CM

## 2017-08-24 DIAGNOSIS — F3130 Bipolar disorder, current episode depressed, mild or moderate severity, unspecified: Secondary | ICD-10-CM

## 2017-08-24 DIAGNOSIS — G2581 Restless legs syndrome: Secondary | ICD-10-CM

## 2017-08-24 MED ORDER — LAMOTRIGINE 25 MG PO TABS: 25.0000 mg | ORAL_TABLET | Freq: Every day | ORAL | 1 refills | Status: DC

## 2017-08-24 MED ORDER — MIRTAZAPINE 30 MG PO TABS: 45.0000 mg | ORAL_TABLET | Freq: Every day | ORAL | 1 refills | Status: DC

## 2017-08-24 MED ORDER — LAMOTRIGINE 100 MG PO TABS: 100.0000 mg | ORAL_TABLET | Freq: Two times a day (BID) | ORAL | 1 refills | Status: DC
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
## 2017-08-25 ENCOUNTER — Other Ambulatory Visit: Payer: Self-pay | Admitting: Physician Assistant

## 2017-08-25 ENCOUNTER — Other Ambulatory Visit (HOSPITAL_COMMUNITY): Payer: Self-pay | Admitting: Physician Assistant

## 2017-08-25 ENCOUNTER — Encounter: Payer: Self-pay | Admitting: Psychiatry

## 2017-08-25 DIAGNOSIS — R52 Pain, unspecified: Secondary | ICD-10-CM

## 2017-08-25 DIAGNOSIS — Z9181 History of falling: Secondary | ICD-10-CM

## 2017-08-28 ENCOUNTER — Ambulatory Visit (INDEPENDENT_AMBULATORY_CARE_PROVIDER_SITE_OTHER): Payer: Medicare Other | Admitting: Licensed Clinical Social Worker

## 2017-08-28 DIAGNOSIS — F5105 Insomnia due to other mental disorder: Secondary | ICD-10-CM | POA: Diagnosis not present

## 2017-08-28 DIAGNOSIS — F411 Generalized anxiety disorder: Secondary | ICD-10-CM

## 2017-08-28 DIAGNOSIS — F3130 Bipolar disorder, current episode depressed, mild or moderate severity, unspecified: Secondary | ICD-10-CM
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
## 2017-09-05 ENCOUNTER — Ambulatory Visit
Admission: RE | Admit: 2017-09-05 | Discharge: 2017-09-05 | Disposition: A | Discharge status: Home / Self Care | Payer: Medicare Other | Source: Ambulatory Visit | Attending: Physician Assistant | Admitting: Physician Assistant

## 2017-09-05 DIAGNOSIS — M898X2 Other specified disorders of bone, upper arm: Secondary | ICD-10-CM | POA: Diagnosis present

## 2017-09-05 DIAGNOSIS — R52 Pain, unspecified: Secondary | ICD-10-CM | POA: Insufficient documentation

## 2017-09-05 DIAGNOSIS — Z9181 History of falling: Secondary | ICD-10-CM | POA: Diagnosis present

## 2017-09-05 DIAGNOSIS — W19XXXA Unspecified fall, initial encounter: Secondary | ICD-10-CM | POA: Diagnosis not present

## 2017-09-13 ENCOUNTER — Other Ambulatory Visit: Payer: Self-pay

## 2017-09-13 ENCOUNTER — Emergency Department: Payer: Medicare Other

## 2017-09-13 ENCOUNTER — Emergency Department
Admission: EM | Admit: 2017-09-13 | Discharge: 2017-09-13 | Disposition: A | Discharge status: Home / Self Care | Payer: Medicare Other | Attending: Emergency Medicine | Admitting: Emergency Medicine

## 2017-09-13 DIAGNOSIS — Y998 Other external cause status: Secondary | ICD-10-CM | POA: Diagnosis not present

## 2017-09-13 DIAGNOSIS — S0990XA Unspecified injury of head, initial encounter: Secondary | ICD-10-CM | POA: Diagnosis present

## 2017-09-13 DIAGNOSIS — Z87891 Personal history of nicotine dependence: Secondary | ICD-10-CM | POA: Diagnosis not present

## 2017-09-13 DIAGNOSIS — S0003XA Contusion of scalp, initial encounter: Secondary | ICD-10-CM

## 2017-09-13 DIAGNOSIS — E119 Type 2 diabetes mellitus without complications: Secondary | ICD-10-CM | POA: Diagnosis not present

## 2017-09-13 DIAGNOSIS — Y9389 Activity, other specified: Secondary | ICD-10-CM | POA: Diagnosis not present

## 2017-09-13 DIAGNOSIS — Z859 Personal history of malignant neoplasm, unspecified: Secondary | ICD-10-CM | POA: Diagnosis not present

## 2017-09-13 DIAGNOSIS — F319 Bipolar disorder, unspecified: Secondary | ICD-10-CM | POA: Insufficient documentation

## 2017-09-13 DIAGNOSIS — Y92129 Unspecified place in nursing home as the place of occurrence of the external cause: Secondary | ICD-10-CM | POA: Insufficient documentation

## 2017-09-13 DIAGNOSIS — Z79899 Other long term (current) drug therapy: Secondary | ICD-10-CM | POA: Insufficient documentation

## 2017-09-13 DIAGNOSIS — Z794 Long term (current) use of insulin: Secondary | ICD-10-CM | POA: Insufficient documentation

## 2017-09-13 DIAGNOSIS — I10 Essential (primary) hypertension: Secondary | ICD-10-CM | POA: Insufficient documentation

## 2017-09-13 DIAGNOSIS — Z8673 Personal history of transient ischemic attack (TIA), and cerebral infarction without residual deficits: Secondary | ICD-10-CM | POA: Insufficient documentation

## 2017-09-13 DIAGNOSIS — F419 Anxiety disorder, unspecified: Secondary | ICD-10-CM | POA: Diagnosis not present

## 2017-09-13 DIAGNOSIS — Z9049 Acquired absence of other specified parts of digestive tract: Secondary | ICD-10-CM | POA: Insufficient documentation

## 2017-09-13 DIAGNOSIS — J45909 Unspecified asthma, uncomplicated: Secondary | ICD-10-CM | POA: Diagnosis not present

## 2017-09-13 DIAGNOSIS — W01198A Fall on same level from slipping, tripping and stumbling with subsequent striking against other object, initial encounter: Secondary | ICD-10-CM | POA: Diagnosis not present

## 2017-09-13 DIAGNOSIS — M7918 Myalgia, other site: Secondary | ICD-10-CM | POA: Diagnosis not present

## 2017-09-13 DIAGNOSIS — W19XXXA Unspecified fall, initial encounter: Secondary | ICD-10-CM

## 2017-09-13 MED ORDER — IBUPROFEN 600 MG PO TABS
600.0000 mg | ORAL_TABLET | Freq: Once | ORAL | Status: AC
Start: 2017-09-13 — End: 2017-09-13
  Administered 2017-09-13: 600 mg via ORAL
  Filled 2017-09-13: qty 1

## 2017-09-13 MED ORDER — TRAMADOL HCL 50 MG PO TABS
50.0000 mg | ORAL_TABLET | Freq: Once | ORAL | Status: AC
  Administered 2017-09-13: 50 mg via ORAL
  Filled 2017-09-13: qty 1

## 2017-09-13 NOTE — ED Notes (Signed)
See triage note. states she fell yesterday after loosing her balance. Hit her head   No LOC but does have small hematoma   Also having some lower back pain and knee pain  States she uses a walker at home

## 2017-09-13 NOTE — Discharge Instructions (Signed)
Follow discharge care instruction and advised over-the-counter ibuprofen for pain.

## 2017-09-13 NOTE — ED Provider Notes (Signed)
[facility] Emergency Department Provider Note   ____________________________________________   First MD Initiated Contact with Patient 09/13/17 1252     (approximate)  I have reviewed the triage vital signs and the nursing notes.   HISTORY  Chief Complaint Fall    HPI Stephanie [name] is a 67 y.o. female patient arrived via EMS from assisted living complaining of headache, pelvic pain, and right knee pain secondary to a fall.  Incident occurred yesterday.  Patient denies LOC.  A small hematoma was appreciated occipital area of the scalp.  Patient rates her pain as a 8/10.  Patient described the pain is "achy".  Patient took Tylenol at approximately 8:00 this morning.  Past Medical History:  Diagnosis Date  . Adenomatous colon polyp 08/27/2014   Polyps x 3  . Anemia   . Anxiety   . 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 Arthritis   . Asthma   . Bipolar affective disorder ([place])    h/o  . Blood transfusion 2000  . Blood transfusion without reported diagnosis   . Cancer ([place])   . Chronic abdominal pain    abdominal wall pain  . 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 Colon polyp   . Depression   . Diabetes mellitus without complication ([place])   . Fatty liver 04/09/08   found in abd CT  . GERD (gastroesophageal reflux disease)   . H/O: CVA (cardiovascular accident)    TIA  . H/O: rheumatic fever   . Hepatitis    HEP "B" twenty years ago  . Hyperlipidemia   . Incontinence   . Insomnia   . Lower extremity edema   . Obstructive sleep apnea    Use C-PAP twice per week  . Platelets decreased ([place])   . RLS (restless legs syndrome)   . Shortness of breath   . Sleep apnea   . Stroke [facility])    TIA- 2002  . Ulcer   . Unspecified disorders of nervous system     Patient Active Problem List   Diagnosis Date Noted  . Sepsis ([place]) 07/20/2017  . Obesity, Class III, BMI 40-49.9 (morbid obesity) ([place]) 05/19/2017  . Cervical stenosis of spine  04/25/2017  . Stress fracture of femur 04/17/2017  . Elevated alkaline phosphatase level 04/16/2017  . Altered mental status 03/07/2017  . Degenerative tear of lateral meniscus of right knee 11/15/2016  . Encephalopathy 07/19/2016  . Degenerative tear of medial meniscus of right knee 07/01/2016  . Primary osteoarthritis of right knee 07/01/2016  . Diabetes mellitus type 1, uncontrolled, without complications ([place]) 09/62/8366  . Lymphedema 06/14/2016  . Pain in limb 05/06/2016  . Swelling of limb 05/06/2016  . Postphlebitic syndrome with inflammation 05/06/2016  . Pneumonia 04/18/2016  . Chest pain 04/04/2016  . Iron deficiency anemia 01/30/2016  . Bilateral carotid artery stenosis 01/12/2016  . Hypoglycemia 09/09/2015  . Hyperglycemia 07/16/2015  . Neuropathy 06/08/2015  . Thrombocytopenia ([place]) 08/19/2014  . Abdominal pain, chronic, epigastric 06/12/2014  . Liver cirrhosis secondary to NASH ([place]) 02/27/2014  . Dizziness and giddiness 10/30/2013  . DOE (dyspnea on exertion) 10/30/2013  . Poorly controlled type 2 diabetes mellitus with complication ([place]) 29/47/6546  . DNR (do not resuscitate) 07/29/2013  . Encounter for routine gynecological examination 07/29/2013  . Dyspnea 07/10/2013  . Lung nodule 07/10/2013  . Varicose veins of left lower extremity 10/09/2012  . GERD (gastroesophageal reflux disease) 10/09/2012  . Lower extremity  edema 02/02/2012  . Cirrhosis of liver ([place]) 02/02/2012  . Anxiety 07/11/2011  . Aortic dissection ([place])   . BACK PAIN, LUMBAR, CHRONIC 11/19/2009  . UNS ADVRS EFF OTH RX MEDICINAL&BIOLOGICAL SBSTNC 12/16/2008  . UNSPECIFIED VITAMIN D DEFICIENCY 09/26/2008  . TOBACCO ABUSE 06/19/2008  . RESTLESS LEGS SYNDROME 04/27/2007  . INSOMNIA 04/27/2007  . HLD (hyperlipidemia) 03/08/2007  . OSA (obstructive sleep apnea) 03/08/2007  . Essential hypertension 03/08/2007  . Asthma 03/08/2007  . BIPOLAR AFFECTIVE DISORDER, HX OF 03/08/2007    Past  Surgical History:  Procedure Laterality Date  . ABDOMINAL EXPLORATION SURGERY    . ABDOMINAL HYSTERECTOMY     total  . Axillary artery cannulation via 8-mm Hemashield graft, median sternotomy, extracorporeal circulation with deep hypothermic circulatory arrest, repair of  aortic dessection  01/23/2009   Dr Arlyce Dice  . BACK SURGERY      x 5  . cataract     both eyes  . CHOLECYSTECTOMY    . COLONOSCOPY    . EYE SURGERY Bilateral    Cataract Extraction with IOL  . HEMORROIDECTOMY     and colon polyp removed  . KNEE ARTHROSCOPY WITH LATERAL RELEASE Right 11/15/2016   Procedure: KNEE ARTHROSCOPY WITH LATERAL RELEASE;  Surgeon: Corky Mull, MD;  Location: [facility] ORS;  Service: Orthopedics;  Laterality: Right;  . LIVER BIOPSY  04/10/2012   Procedure: LIVER BIOPSY;  Surgeon: Inda Castle, MD;  Location: [facility];  Service: Endoscopy;  Laterality: N/A;  ultrasound to mark order for abd limited/liver to be marked to be sent by [email or web address]   . LUMBAR FUSION  10/09  . LUMBAR WOUND DEBRIDEMENT  05/09/2011   Procedure: LUMBAR WOUND DEBRIDEMENT;  Surgeon: Dahlia Bailiff;  Location: [facility];  Service: Orthopedics;  Laterality: N/A;  IRRIGATION AND DEBRIDEMENT SPINAL WOUND  . POSTERIOR CERVICAL FUSION/FORAMINOTOMY    . ROTATOR CUFF REPAIR     left    Prior to Admission medications   Medication Sig Start Date End Date Taking? Authorizing Provider  budesonide-formoterol (SYMBICORT) 160-4.5 MCG/ACT inhaler Inhale 2 puffs into the lungs 2 (two) times daily. (0800 & 2000)    [provider]  buPROPion (WELLBUTRIN XL) 150 MG 24 hr tablet Take 150 mg by mouth daily.    [provider]  clonazepam (KLONOPIN) 0.125 MG disintegrating tablet Take 0.25 mg by mouth 3 (three) times daily.     [provider]  dicyclomine (BENTYL) 10 MG capsule Take 10 mg by mouth 4 (four) times daily -  before meals and at bedtime.    [provider]  ferrous sulfate 325 (65 FE) MG tablet  Take 325 mg by mouth daily with breakfast.    [provider]  furosemide (LASIX) 80 MG tablet Take 80 mg by mouth.    [provider]  gabapentin (NEURONTIN) 600 MG tablet Take 600 mg by mouth 2 (two) times daily.    [provider]  insulin glargine (LANTUS) 100 UNIT/ML injection Inject 10-33 Units into the skin at bedtime.    [provider]  insulin lispro (HUMALOG) 100 UNIT/ML injection Inject into the skin 3 (three) times daily before meals.    [provider]  lactulose (CHRONULAC) 10 GM/15ML solution Take 15 g by mouth daily. (0800) 10/17/16   [provider]  lamoTRIgine (LAMICTAL) 100 MG tablet Take 1 tablet (100 mg total) by mouth 2 (two) times daily. (0800 & 2000) 08/24/17   Ursula Alert, MD  lamoTRIgine (LAMICTAL) 25 MG  tablet Take 1 tablet (25 mg total) by mouth daily. To be added to the Lamictal 100 mg in the AM 08/24/17   Eappen, Ria Clock, MD  lidocaine (LIDODERM) 5 % Place 2 patches onto the skin daily. Remove & Discard patch within 12 hours or as directed by MD    [provider]  liraglutide (VICTOZA) 18 MG/3ML SOPN Inject 1.8 mg into the skin daily.    [provider]  LYRICA 150 MG capsule Take 150 mg by mouth 3 (three) times daily. (0800, 1400 & 2000) 11/29/16   [provider]  metoCLOPramide (REGLAN) 5 MG tablet Take 5 mg by mouth 4 (four) times daily -  before meals and at bedtime. (0800, 1200, 1600 & 2000)    [provider]  mirtazapine (REMERON) 30 MG tablet Take 1.5 tablets (45 mg total) by mouth at bedtime. 08/24/17   Ursula Alert, MD  nystatin (MYCOSTATIN/NYSTOP) powder Apply topically 4 (four) times daily. 08/07/17   Lequita Asal, MD  omeprazole (PRILOSEC) 20 MG capsule Take 20 mg by mouth daily.     [provider]  oxybutynin (DITROPAN-XL) 5 MG 24 hr tablet Take 5 mg by mouth daily. (0800)    [provider]  potassium chloride (K-DUR,KLOR-CON) 10 MEQ  tablet Take 1 tablet (10 mEq total) by mouth daily for 4 days. 08/15/17 08/19/17  Harvest Dark, MD  rOPINIRole (REQUIP) 3 MG tablet Take 3 mg by mouth at bedtime. (2000)    [provider]  simvastatin (ZOCOR) 10 MG tablet Take 10 mg by mouth at bedtime. (2000)    [provider]  spironolactone (ALDACTONE) 50 MG tablet Take 50 mg by mouth daily. (0800)    [provider]  vitamin C (ASCORBIC ACID) 500 MG tablet Take 500 mg by mouth 2 (two) times daily. (0800 & 2000)    [provider]  zolpidem (AMBIEN) 5 MG tablet Take 5 mg by mouth at bedtime. (2000)    [provider]    Allergies Doxycycline  Family History  Problem Relation Age of Onset  . Breast cancer Mother   . Lung cancer Mother   . Stomach cancer Father   . Diabetes Unknown        4 aunts, and 1 uncle  . Depression Brother   . Anxiety disorder Brother   . Colon cancer Neg Hx   . Esophageal cancer Neg Hx   . Rectal cancer Neg Hx     Social History Social History   Tobacco Use  . Smoking status: Former Smoker    Packs/day: 1.50    Years: 50.00    Pack years: 75.00    Types: Cigarettes    Last attempt to quit: 04/06/2013    Years since quitting: 4.4  . Smokeless tobacco: Never Used  Substance Use Topics  . Alcohol use: No    Alcohol/week: 0.0 oz  . Drug use: No    Review of Systems Constitutional: No fever/chills Eyes: No visual changes. ENT: No sore throat. Cardiovascular: Denies chest pain. Respiratory: Denies shortness of breath. Gastrointestinal: No abdominal pain.  No nausea, no vomiting.  No diarrhea.  No constipation. Genitourinary: Negative for dysuria. Musculoskeletal: Negative for back pain. Skin: Negative for rash. Neurological: Negative for headaches, focal weakness or numbness. Psychiatric:Anxiety, bipolar, and depression. Endocrine: Diabetes and hyperlipidemia. Allergic/Immunilogical:  Doxycycline ____________________________________________   PHYSICAL EXAM:  VITAL SIGNS: ED Triage Vitals  Enc Vitals Group     BP 09/13/17 0947 (!) 114/49  Pulse Rate 09/13/17 0947 77     Resp 09/13/17 0947 17     Temp 09/13/17 0947 97.7 F (36.5 C)     Temp Source 09/13/17 0947 Oral     SpO2 09/13/17 0947 94 %     Weight 09/13/17 0948 230 lb (104.3 kg)     Height 09/13/17 0948 5' 3"  (1.6 m)     Head Circumference --      Peak Flow --      Pain Score 09/13/17 0948 10     Pain Loc --      Pain Edu? --      Excl. in [place]? --    Constitutional: Alert and oriented. Well appearing and in no acute distress. Eyes: Conjunctivae are normal. PERRL. EOMI. Head: Atraumatic.  Palpable hematoma a similar aspect of the scalp.   Neck: No stridor.  No cervical spine tenderness to palpation. Hematological/Lymphatic/Immunilogical: No cervical lymphadenopathy. Cardiovascular: Normal rate, regular rhythm. Grossly normal heart sounds.  Good peripheral circulation. Respiratory: Normal respiratory effort.  No retractions. Lungs CTAB. Musculoskeletal: No obvious deformity of the right knee.  Mild guarding palpation anterior patella. Neurologic:  Normal speech and language. No gross focal neurologic deficits are appreciated. No gait instability. Skin:  Skin is warm, dry and intact. No rash noted.  No edema, abrasion, or ecchymosis of the right knee. Psychiatric: Mood and affect are normal. Speech and behavior are normal.  ____________________________________________   LABS (all labs ordered are listed, but only abnormal results are displayed)  Labs Reviewed - No data to display ____________________________________________  EKG   ____________________________________________  RADIOLOGY  ED MD interpretation:    Official radiology report(s): Dg Pelvis 1-2 Views  Result Date: 09/13/2017 CLINICAL DATA:  Fall, landed on bottom. EXAM: PELVIS - 1-2 VIEW COMPARISON:  None. FINDINGS: Early  degenerative changes in the hips bilaterally with joint space narrowing and early spurring. SI joints are symmetric and unremarkable. No acute bony abnormality. Specifically, no fracture, subluxation, or dislocation. IMPRESSION: No acute bony abnormality. Electronically Signed   By: Rolm Baptise M.D.   On: 09/13/2017 10:49   Ct Head Wo Contrast  Result Date: 09/13/2017 CLINICAL DATA:  Pain following fall.  Blurred vision EXAM: CT HEAD WITHOUT CONTRAST TECHNIQUE: Contiguous axial images were obtained from the base of the skull through the vertex without intravenous contrast. COMPARISON:  August 15, 2017 FINDINGS: Brain: The ventricles are normal in size and configuration. There is no intracranial mass, hemorrhage, extra-axial fluid collection, or midline shift. There is rather minimal small vessel disease in the centra semiovale bilaterally. Elsewhere gray-white compartments appear normal. No evident acute infarct. Vascular: No hyperdense vessel. There is calcification in the distal right vertebral artery and in each carotid siphon. Skull: There are no blastic or lytic bone lesions. Sinuses/Orbits: There is mucosal thickening in several ethmoid air cells. Other paranasal sinuses are clear. Orbits appear symmetric bilaterally. Other: Mastoid air cells are clear. There is debris in each external auditory canal. IMPRESSION: Minimal periventricular small vessel disease. No evident acute infarct. No mass or hemorrhage. There are foci of arterial vascular calcification. There is mucosal thickening in several ethmoid air cells. There is probable cerumen in each external auditory canal. Electronically Signed   By: Lowella Grip III M.D.   On: 09/13/2017 10:38   Dg Knee Complete 4 Views Right  Result Date: 09/13/2017 CLINICAL DATA:  Right knee pain after fall. EXAM: RIGHT KNEE - COMPLETE 4+ VIEW COMPARISON:  Right knee x-rays dated December 09, 2016.  FINDINGS: No acute fracture or dislocation. No joint effusion. Mild  tricompartmental osteoarthritis, similar to prior study. Chondrocalcinosis of the menisci. Osteopenia. Soft tissues are unremarkable. IMPRESSION: 1.  No acute osseous abnormality. Electronically Signed   By: Titus Dubin M.D.   On: 09/13/2017 10:50    ____________________________________________   PROCEDURES  Procedure(s) performed: None  Procedures  Critical Care performed: No  ____________________________________________   INITIAL IMPRESSION / ASSESSMENT AND PLAN / ED COURSE  As part of my medical decision making, I reviewed the following data within the [facility]    Patient complain of scalp pain, radicular back pain and right knee pain secondary to a fall yesterday.  Patient denies LOC.  Patient physical exam was remarkable for small hematoma cervical area.  Discussed CT and x-ray findings with patient which were grossly unremarkable.  Patient given discharge care instruction and advised over-the-counter ibuprofen or Tylenol as needed.  Advised to follow-up PCP if complaints persist.     ____________________________________________   FINAL CLINICAL IMPRESSION(S) / ED DIAGNOSES  Final diagnoses:  Contusion of scalp, initial encounter  Fall, initial encounter  Musculoskeletal pain     ED Discharge Orders    None       Note:  This document was prepared using Dragon voice recognition software and may include unintentional dictation errors.    Sable Feil, PA-C 09/13/17 1358    Schaevitz, Randall An, MD 09/13/17 [phone]

## 2017-09-13 NOTE — ED Triage Notes (Signed)
Pt comes into the ED via EMS from the [place] living with c/o loosing her balance yesterday and fall back into the wall. Very small hematoma to the back of the head with c/o right lower back pain that radiates into the leg and right knee pain with swelling. Pt is a/ox4 on arrival..

## 2017-09-25 ENCOUNTER — Ambulatory Visit
Payer: Medicare Other | Attending: Student in an Organized Health Care Education/Training Program | Admitting: Student in an Organized Health Care Education/Training Program

## 2017-09-25 ENCOUNTER — Ambulatory Visit (INDEPENDENT_AMBULATORY_CARE_PROVIDER_SITE_OTHER): Payer: Medicare Other | Admitting: Licensed Clinical Social Worker

## 2017-09-25 ENCOUNTER — Other Ambulatory Visit: Payer: Self-pay

## 2017-09-25 ENCOUNTER — Encounter: Payer: Self-pay | Admitting: Student in an Organized Health Care Education/Training Program

## 2017-09-25 ENCOUNTER — Ambulatory Visit: Payer: Medicaid Other | Admitting: Psychiatry

## 2017-09-25 VITALS — BP 103/55 | HR 87 | Temp 98.4°F | Resp 18 | Ht 63.0 in | Wt 220.0 lb

## 2017-09-25 DIAGNOSIS — Z79899 Other long term (current) drug therapy: Secondary | ICD-10-CM | POA: Insufficient documentation

## 2017-09-25 DIAGNOSIS — E119 Type 2 diabetes mellitus without complications: Secondary | ICD-10-CM | POA: Diagnosis not present

## 2017-09-25 DIAGNOSIS — M797 Fibromyalgia: Secondary | ICD-10-CM | POA: Diagnosis not present

## 2017-09-25 DIAGNOSIS — M1711 Unilateral primary osteoarthritis, right knee: Secondary | ICD-10-CM | POA: Insufficient documentation

## 2017-09-25 DIAGNOSIS — Z90710 Acquired absence of both cervix and uterus: Secondary | ICD-10-CM | POA: Insufficient documentation

## 2017-09-25 DIAGNOSIS — Z5181 Encounter for therapeutic drug level monitoring: Secondary | ICD-10-CM | POA: Insufficient documentation

## 2017-09-25 DIAGNOSIS — G4733 Obstructive sleep apnea (adult) (pediatric): Secondary | ICD-10-CM | POA: Diagnosis not present

## 2017-09-25 DIAGNOSIS — I10 Essential (primary) hypertension: Secondary | ICD-10-CM | POA: Diagnosis not present

## 2017-09-25 DIAGNOSIS — Z0289 Encounter for other administrative examinations: Secondary | ICD-10-CM

## 2017-09-25 DIAGNOSIS — Z87891 Personal history of nicotine dependence: Secondary | ICD-10-CM | POA: Insufficient documentation

## 2017-09-25 DIAGNOSIS — M170 Bilateral primary osteoarthritis of knee: Secondary | ICD-10-CM

## 2017-09-25 DIAGNOSIS — Z794 Long term (current) use of insulin: Secondary | ICD-10-CM | POA: Insufficient documentation

## 2017-09-25 DIAGNOSIS — G894 Chronic pain syndrome: Secondary | ICD-10-CM | POA: Diagnosis not present

## 2017-09-25 DIAGNOSIS — F3130 Bipolar disorder, current episode depressed, mild or moderate severity, unspecified: Secondary | ICD-10-CM

## 2017-09-25 DIAGNOSIS — Z9889 Other specified postprocedural states: Secondary | ICD-10-CM | POA: Insufficient documentation

## 2017-09-25 DIAGNOSIS — Z6838 Body mass index (BMI) 38.0-38.9, adult: Secondary | ICD-10-CM | POA: Diagnosis not present

## 2017-09-25 MED ORDER — HYDROCODONE-ACETAMINOPHEN 7.5-325 MG PO TABS: 1.0000 | ORAL_TABLET | Freq: Three times a day (TID) | ORAL | 0 refills | Status: DC | PRN

## 2017-09-25 NOTE — Patient Instructions (Addendum)
You have been given 2 scripts for Hydrocodone today.  STOP TRAMADOL.  You have been given pre procedure instructions with sedation.         1.  Since your right knee is looking better from a redness standpoint we will get you scheduled for right knee genicular nerve block with sedation under fluoroscopy. 2.  Please discontinue tramadol. New prescription for hydrocodone 7.5 mg 3 times daily as needed 3.  Follow-up for right knee genicular nerve block.Genicular Nerve Block  What is a genicular nerve block? A genicular nerve block is the injection of a local anesthetic to block the nerves that transmits pain from the knee.  What is the purpose of a facet nerve block? A genicular nerve block is a diagnostic procedure to determine if the pathologic changes (i.e. arthritis, meniscal tears, etc) and inflammation within the knee joint is the source of your knee pain. It also confirms that the knee pain will respond well to the actual treatment procedure. If a genicular nerve block works, it will give you relief for several hours. After that, the pain is expected to return to normal. This test is always performed twice (usually a week or two apart) because two successful tests are required to move onto treatment. If both diagnostic tests are positive, then we schedule a treatment called radiofrequency (RF) ablation. In this procedure, the same nerves are cauterized, which typically leads to pain relief for 4 -18 months. If this process works well for one knee, it can be performed on the other knee if needed.  How is the procedure performed? You will be placed on the procedure table. The injection site is sterilized with either iodine or chlorhexadine. The site to be injected is numbed with a local anesthetic, and a needle is directed to the target area. X-ray guidance is used to ensure proper placement and positioning of the needle. When the needle is properly positioned near the genicular nerve, local  anesthetic is injected to numb that nerve. This will be repeated at multiple sites around the knee to block all genicular nerves.  Will the procedure be painful? The injection can be painful and we therefore provide the option of receiving IV sedation. IV sedation, combined with local anesthetic, can make the injection nearly pain free. It allows you to remain very still during the procedure, which can also make the injection easier, faster, and more successful. If you decide to have IV sedation, you must have a driver to get you home safely afterwards. In addition, you cannot have anything to eat or drink within 8 hours of your appointment (clear liquids are allowed until 3 hours before the procedure). If you take medications for diabetes, these medications may need to be adjusted the morning of the procedure. Your primary care physician can help you with this adjustment.  What are the discharge instructions? If you received IV sedation do not drive or operate machinery for at least 24 hours after the procedure. You may return to work the next day following your procedure. You may resume your normal diet immediately. Do not engage in any strenuous activity for 24 hours. You should, however, engage in moderate activity that typically causes your ususal pain. If the block works, those activities should not be painful for several hours after the injection. Do not take a bath, swim, or use a hot tub for 24 hours (you may take a shower). Call the office if you have any of the following: severe pain afterwards (different  than your usual symptoms), redness/swelling/discharge at the injection site(s), fevers/chills, difficulty with bowel or bladder functions.  What are the risks and side effects? The complication rate for this procedure is very low. Whenever a needle enters the skin, bleeding or infection can occur. Some other serious but extremely rare risks include paralysis and death. You may have an allergic  reaction to any of the medications used. If you have a known allergy to any medications, especially local anesthetics, notify our staff before the procedure takes place. You may experience any of the following side effects up to 4 - 6 hours after the procedure: . Leg muscle weakness or numbness may occur due to the local anesthetic affecting the nerves that control your legs (this is a temporary affect and it is not paralysis). If you have any leg weakness or numbness, walk only with assistance in order to prevent falls and injury. Your leg strength will return slowly and completely. . Dizziness may occur due to a decrease in your blood pressure. If this occurs, remain in a seated or lying position. Gradually sit up, and then stand after at least 10 minutes of sitting. . Mild headaches may occur. Drink fluids and take pain medications if needed. If the headaches persist or become severe, call the office. . Mild discomfort at the injection site can occur. This typically lasts for a few hours but can persist for a couple days. If this occurs, take anti-inflammatories or pain medications, apply ice to the area the day of the procedure. If it persists, apply moist heat in the day(s) following.  The side effects listed above can be normal. They are not dangerous and will resolve on their own. If, however, you experience any of the following, a complication may have occurred and you should either contact your doctor. If he is not readily available, then you should proceed to the closest urgent care center for evaluation: . Severe or progressive pain at the injection site(s) . Arm or leg weakness that progressively worsens or persists for longer than 8 hours . Severe or progressive redness, swelling, or discharge from the injections site(s) . Fevers, chills, nausea, or vomiting . Bowel or bladder dysfunction (i.e. inability to urinate or pass stool or difficulty controlling either)  How long does it take for  the procedure to work? You should feel relief from your usual pain within the first hour. Again, this is only expected to last for several hours, at the most. Remember, you may be sore in the middle part of your back from the needles, and you must distinguish this from your usual pain. Preparing for Procedure with Sedation Instructions: . Oral Intake: Do not eat or drink anything for at least 8 hours prior to your procedure. . Transportation: Public transportation is not allowed. Bring an adult driver. The driver must be physically present in our waiting room before any procedure can be started. [place] Physical Assistance: Bring an adult capable of physically assisting you, in the event you need help. . Blood Pressure Medicine: Take your blood pressure medicine with a sip of water the morning of the procedure. . Insulin: Take only  of your normal insulin dose. . Preventing infections: Shower with an antibacterial soap the morning of your procedure. . Build-up your immune system: Take 1000 mg of Vitamin C with every meal (3 times a day) the day prior to your procedure. . Pregnancy: If you are pregnant, call and cancel the procedure. . Sickness: If you have a cold,  fever, or any active infections, call and cancel the procedure. . Arrival: You must be in the facility at least 30 minutes prior to your scheduled procedure. . Children: Do not bring children with you. . Dress appropriately: Bring dark clothing that you would not mind if they get stained. . Valuables: Do not bring any jewelry or valuables. Procedure appointments are reserved for interventional treatments only. [place] Kitchen No Prescription Refills. . No medication changes will be discussed during procedure appointments. . No disability issues will be discussed.

## 2017-09-25 NOTE — Progress Notes (Signed)
Safety precautions to be maintained throughout the outpatient stay will include: orient to surroundings, keep bed in low position, maintain call bell within reach at all times, provide assistance with transfer out of bed and ambulation.  

## 2017-09-25 NOTE — Progress Notes (Signed)
Patient's Name: Stephanie [name]  MRN: [id number]  Referring Provider: Sarajane Jews, MD  DOB: [birth year]  PCP: Sarajane Jews, MD  DOS: 09/25/2017  Note by: Gillis Santa, MD  Service setting: Ambulatory outpatient  Specialty: Interventional Pain Management  Location: [facility] (AMB) Pain Management Facility    Patient type: Established   Primary Reason(s) for Visit: Encounter for prescription drug management. (Level of risk: moderate)  CC: Knee Pain (right )  HPI  Stephanie [name] is a 67 y.o. year old, female patient, who comes today for a medication management evaluation. She has UNSPECIFIED VITAMIN D DEFICIENCY; HLD (hyperlipidemia); TOBACCO ABUSE; OSA (obstructive sleep apnea); RESTLESS LEGS SYNDROME; Essential hypertension; Asthma; BACK PAIN, LUMBAR, CHRONIC; INSOMNIA; UNS ADVRS EFF OTH RX MEDICINAL&BIOLOGICAL SBSTNC; BIPOLAR AFFECTIVE DISORDER, HX OF; Aortic dissection ([place]); Anxiety; Lower extremity edema; Cirrhosis of liver ([place]); Varicose veins of left lower extremity; GERD (gastroesophageal reflux disease); Dyspnea; Lung nodule; DNR (do not resuscitate); Encounter for routine gynecological examination; Poorly controlled type 2 diabetes mellitus with complication ([place]); Dizziness and giddiness; DOE (dyspnea on exertion); Liver cirrhosis secondary to NASH ([place]); Abdominal pain, chronic, epigastric; Thrombocytopenia ([place]); Neuropathy; Hyperglycemia; Hypoglycemia; Iron deficiency anemia; Chest pain; Pneumonia; Pain in limb; Swelling of limb; Postphlebitic syndrome with inflammation; Lymphedema; Encephalopathy; Bilateral carotid artery stenosis; Degenerative tear of lateral meniscus of right knee; Degenerative tear of medial meniscus of right knee; Diabetes mellitus type 1, uncontrolled, without complications ([place]); Primary osteoarthritis of right knee; Altered mental status; Elevated alkaline phosphatase level; Sepsis ([place]); Cervical stenosis of spine; Obesity, Class III, BMI 40-49.9 (morbid obesity) ([place]); and  Stress fracture of femur on their problem list. Her primarily concern today is the Knee Pain (right )  Pain Assessment: Location: Right Knee Radiating: Denies Onset: More than a month ago Duration: Chronic pain Quality: Pins and needles, Constant, Sharp Severity: 8 /10 (self-reported pain score)  Note: Reported level is inconsistent with clinical observations. Clinically the patient looks like a 4/10             When using our objective Pain Scale, levels between 6 and 10/10 are said to belong in an emergency room, as it progressively worsens from a 6/10, described as severely limiting, requiring emergency care not usually available at an outpatient pain management facility. At a 6/10 level, communication becomes difficult and requires great effort. Assistance to reach the emergency department may be required. Facial flushing and profuse sweating along with potentially dangerous increases in heart rate and blood pressure will be evident. Effect on ADL: Yes; frequent falls  Timing: Constant Modifying factors: No relief  Stephanie [name] was last scheduled for an appointment on 08/14/2017 for medication management. During today's appointment we reviewed Stephanie [name]'s chronic pain status, as well as her outpatient medication regimen.  Patient follows up and is continuing to endorse severe right knee pain.  Patient also had a fall on 08/15/2017, presented to the ED, CT of her head was negative and patient followed up with her PCP.  Patient also saw orthopedics for her right knee pain and was offered right knee replacement surgery the patient wants to think about this further.  She states that the tramadol is not effective and she is taking 100 mg 3 times a day as needed.  We discussed changing to low-dose hydrocodone which may be more effective and also lower total MME compared to current Tramadol dose.  The patient  reports that she does not use drugs. Her body mass index is 38.97 kg/m.  Further details  on  both, my assessment(s), as well as the proposed treatment plan, please see below.  Controlled Substance Pharmacotherapy Assessment REMS (Risk Evaluation and Mitigation Strategy)  Analgesic: Tramadol 100 mg TID prn MME/day:30 mg/day.  Stephanie Napoleon, RN  09/25/2017  1:16 PM  Sign at close encounter Safety precautions to be maintained throughout the outpatient stay will include: orient to surroundings, keep bed in low position, maintain call bell within reach at all times, provide assistance with transfer out of bed and ambulation.    Pharmacokinetics: Liberation and absorption (onset of action): WNL Distribution (time to peak effect): WNL Metabolism and excretion (duration of action): WNL         Pharmacodynamics: Desired effects: Analgesia: Stephanie [name] reports <50% benefit. Functional ability: Patient reports being unable to accomplish basic ADLs Clinically meaningful improvement in function (CMIF): Sustained CMIF goals met Perceived effectiveness: Described as ineffective and would like to make some changes Undesirable effects: Side-effects or Adverse reactions: None reported Monitoring: [place] PMP: Online review of the past 7-monthperiod conducted. Compliant with practice rules and regulations Last UDS on record: Summary  Date Value Ref Range Status  02/02/2017 FINAL  Final    Comment:    ==================================================================== TOXASSURE COMP DRUG ANALYSIS,UR ==================================================================== Test                             Result       Flag       Units Drug Present and Declared for Prescription Verification   7-aminoclonazepam              330          EXPECTED   ng/mg creat    7-aminoclonazepam is an expected metabolite of clonazepam. Source    of clonazepam is a scheduled prescription medication.   Tramadol                       11726        EXPECTED   ng/mg creat   O-Desmethyltramadol            3096          EXPECTED   ng/mg creat   N-Desmethyltramadol            7048         EXPECTED   ng/mg creat    Source of tramadol is a prescription medication.    O-desmethyltramadol and N-desmethyltramadol are expected    metabolites of tramadol.   Gabapentin                     PRESENT      EXPECTED   Pregabalin                     PRESENT      EXPECTED   Lamotrigine                    PRESENT      EXPECTED   Bupropion                      PRESENT      EXPECTED   Hydroxybupropion               PRESENT      EXPECTED    Hydroxybupropion is an expected metabolite of bupropion.   Mirtazapine  PRESENT      EXPECTED   Acetaminophen                  PRESENT      EXPECTED   Lidocaine                      PRESENT      EXPECTED Drug Present not Declared for Prescription Verification   Alcohol, Ethyl                 0.124        UNEXPECTED g/dL    Sources of ethyl alcohol include alcoholic beverages or as a    fermentation product of glucose; glucose was not detected in this    specimen. Ethyl alcohol result should be interpreted in the    context of all available clinical and behavioral information.   Ibuprofen                      PRESENT      UNEXPECTED Drug Absent but Declared for Prescription Verification   Oxycodone                      Not Detected UNEXPECTED ng/mg creat   Tizanidine                     Not Detected UNEXPECTED    Tizanidine, as indicated in the declared medication list, is not    always detected even when used as directed.   Zolpidem                       Not Detected UNEXPECTED    Zolpidem, as indicated in the declared medication list, is not    always detected even when used as directed.   Diclofenac                     Not Detected UNEXPECTED    Diclofenac, as indicated in the declared medication list, is not    always detected even when used as directed.   Diphenhydramine                Not Detected UNEXPECTED   Guaifenesin                    Not Detected  UNEXPECTED ==================================================================== Test                      Result    Flag   Units      Ref Range   Creatinine              27               mg/dL      >=20 ==================================================================== Declared Medications:  The flagging and interpretation on this report are based on the  following declared medications.  Unexpected results may arise from  inaccuracies in the declared medications.  **Note: The testing scope of this panel includes these medications:  Bupropion (Wellbutrin)  Clonazepam (Klonopin)  Diphenhydramine (Benadryl)  Gabapentin (Neurontin)  Guaifenesin  Lamotrigine (Lamictal)  Mirtazapine (Remeron)  Oxycodone (Oxy IR)  Oxycodone (Roxicodone)  Pregabalin (Lyrica)  Tramadol (Ultram)  **Note: The testing scope of this panel does not include small to  moderate amounts of these reported medications:  Acetaminophen (Tylenol)  Diclofenac (Voltaren)  Lidocaine (Lidoderm)  Tizanidine (Zanaflex)  Zolpidem (Ambien)  **  Note: The testing scope of this panel does not include following  reported medications:  Albuterol (Proventil)  Albuterol (Ventolin HFA)  Budenoside (Symbicort)  Dicyclomine (Bentyl)  Formoterol (Symbicort)  Furosemide (Lasix)  Insulin (Humalog)  Insulin (Lantus)  Iron (Ferrous Sulfate)  Lactulose  Loperamide (Imodium)  Magnesium (Milk of Magnesia)  Metoclopramide (Reglan)  Omeprazole (Prilosec)  Ondansetron (Zofran)  Oxybutynin (Ditropan)  Potassium (K-Dur)  Potassium (Klor-Con)  Ropinirole  Simvastatin (Zocor)  Spironolactone (Aldactone)  Topical  Vitamin C ==================================================================== For clinical consultation, please call [phone]. ====================================================================    UDS interpretation: Compliant Patient reminded of the CDC guidelines recommending to stay away from the sedatives &  benzodiazepines due to the risk of respiratory depression and death. Medication Assessment Form: Reviewed. Patient indicates being compliant with therapy Treatment compliance: Compliant Risk Assessment Profile: Aberrant behavior: See prior evaluations. None observed or detected today Comorbid factors increasing risk of overdose: See prior notes. No additional risks detected today Risk of substance use disorder (SUD): Low Opioid Risk Tool - 09/25/17 1315      Family History of Substance Abuse   Alcohol  Positive Female    Illegal Drugs  Negative    Rx Drugs  Negative      Personal History of Substance Abuse   Alcohol  Negative    Illegal Drugs  Negative    Rx Drugs  Negative      Age   Age between 65-45 years   No      History of Preadolescent Sexual Abuse   History of Preadolescent Sexual Abuse  Negative or Female      Psychological Disease   Psychological Disease  Negative    Depression  Negative      Total Score   Opioid Risk Tool Scoring  1    Opioid Risk Interpretation  Low Risk      ORT Scoring interpretation table:  Score <3 = Low Risk for SUD  Score between 4-7 = Moderate Risk for SUD  Score >8 = High Risk for Opioid Abuse   Risk Mitigation Strategies:  Patient Counseling: Covered Patient-Prescriber Agreement (PPA): Present and active  Notification to other healthcare providers: Done  Pharmacologic Plan: Discontinue tramadol.  Start hydrocodone 7.5 mg 3 times daily as needed.             Laboratory Chemistry  Inflammation Markers (CRP: Acute Phase) (ESR: Chronic Phase) Lab Results  Component Value Date   ESRSEDRATE 71 (H) 04/20/2016   LATICACIDVEN 1.1 07/20/2017                         Rheumatology Markers Lab Results  Component Value Date   ANA POS (A) 03/19/2012                        Renal Function Markers Lab Results  Component Value Date   BUN 10 08/15/2017   CREATININE 0.74 08/15/2017   GFRAA >60 08/15/2017   GFRNONAA >60 08/15/2017                               Hepatic Function Markers Lab Results  Component Value Date   AST 25 07/19/2017   ALT 19 07/19/2017   ALBUMIN 3.2 (L) 07/19/2017   ALKPHOS 143 (H) 07/19/2017   HCVAB NEGATIVE 02/03/2012   AMYLASE 30 06/12/2014   LIPASE 17 12/09/2015   AMMONIA 45 (H) 07/21/2017  Electrolytes Lab Results  Component Value Date   NA 141 08/15/2017   K 2.8 (L) 08/15/2017   CL 103 08/15/2017   CALCIUM 9.0 08/15/2017   MG 2.1 07/21/2017   PHOS 3.7 07/21/2017                        Neuropathy Markers Lab Results  Component Value Date   VITAMINB12 431 11/13/2015   FOLATE 29.0 11/13/2015   HGBA1C 5.2 07/20/2016                        Bone Pathology Markers Lab Results  Component Value Date   VD25OH 30 11/12/2012   VD125OH2TOT 10 (L) 06/19/2008                         Coagulation Parameters Lab Results  Component Value Date   INR 1.00 03/07/2017   LABPROT 13.1 03/07/2017   APTT 37 (H) 03/07/2017   PLT 135 (L) 08/15/2017                        Cardiovascular Markers Lab Results  Component Value Date   BNP 108.0 (H) 07/28/2016   CKTOTAL 119 06/30/2012   CKMB 0.6 06/30/2012   TROPONINI <0.03 07/19/2017   HGB 12.4 08/15/2017   HCT 36.3 08/15/2017                         CA Markers No results found for: CEA, CA125, LABCA2                      Note: Lab results reviewed.  Recent Diagnostic Imaging Results  DG Knee Complete 4 Views Right CLINICAL DATA:  Right knee pain after fall.  EXAM: RIGHT KNEE - COMPLETE 4+ VIEW  COMPARISON:  Right knee x-rays dated December 09, 2016.  FINDINGS: No acute fracture or dislocation. No joint effusion. Mild tricompartmental osteoarthritis, similar to prior study. Chondrocalcinosis of the menisci. Osteopenia. Soft tissues are unremarkable.  IMPRESSION: 1.  No acute osseous abnormality.  Electronically Signed   By: Titus Dubin M.D.   On: 09/13/2017 10:50 DG Pelvis 1-2 Views CLINICAL DATA:   Fall, landed on bottom.  EXAM: PELVIS - 1-2 VIEW  COMPARISON:  None.  FINDINGS: Early degenerative changes in the hips bilaterally with joint space narrowing and early spurring. SI joints are symmetric and unremarkable. No acute bony abnormality. Specifically, no fracture, subluxation, or dislocation.  IMPRESSION: No acute bony abnormality.  Electronically Signed   By: Rolm Baptise M.D.   On: 09/13/2017 10:49 CT Head Wo Contrast CLINICAL DATA:  Pain following fall.  Blurred vision  EXAM: CT HEAD WITHOUT CONTRAST  TECHNIQUE: Contiguous axial images were obtained from the base of the skull through the vertex without intravenous contrast.  COMPARISON:  August 15, 2017  FINDINGS: Brain: The ventricles are normal in size and configuration. There is no intracranial mass, hemorrhage, extra-axial fluid collection, or midline shift. There is rather minimal small vessel disease in the centra semiovale bilaterally. Elsewhere gray-white compartments appear normal. No evident acute infarct.  Vascular: No hyperdense vessel. There is calcification in the distal right vertebral artery and in each carotid siphon.  Skull: There are no blastic or lytic bone lesions.  Sinuses/Orbits: There is mucosal thickening in several ethmoid air cells. Other paranasal sinuses are clear. Orbits appear symmetric bilaterally.  Other: Mastoid air cells are clear. There is debris in each external auditory canal.  IMPRESSION: Minimal periventricular small vessel disease. No evident acute infarct. No mass or hemorrhage.  There are foci of arterial vascular calcification. There is mucosal thickening in several ethmoid air cells. There is probable cerumen in each external auditory canal.  Electronically Signed   By: Lowella Grip III M.D.   On: 09/13/2017 10:38  Complexity Note: Imaging results reviewed. Results shared with Ms. Rew, using [employer].                         Meds    Current Outpatient Medications:  .  budesonide-formoterol (SYMBICORT) 160-4.5 MCG/ACT inhaler, Inhale 2 puffs into the lungs 2 (two) times daily. (0800 & 2000), Disp: , Rfl:  .  buPROPion (WELLBUTRIN XL) 150 MG 24 hr tablet, Take 150 mg by mouth daily., Disp: , Rfl:  .  clonazepam (KLONOPIN) 0.125 MG disintegrating tablet, Take 0.25 mg by mouth 3 (three) times daily. , Disp: , Rfl:  .  dicyclomine (BENTYL) 10 MG capsule, Take 10 mg by mouth 4 (four) times daily -  before meals and at bedtime., Disp: , Rfl:  .  ferrous sulfate 325 (65 FE) MG tablet, Take 325 mg by mouth daily with breakfast., Disp: , Rfl:  .  furosemide (LASIX) 80 MG tablet, Take 80 mg by mouth., Disp: , Rfl:  .  gabapentin (NEURONTIN) 600 MG tablet, Take 600 mg by mouth 2 (two) times daily., Disp: , Rfl:  .  insulin glargine (LANTUS) 100 UNIT/ML injection, Inject 10-33 Units into the skin at bedtime., Disp: , Rfl:  .  insulin lispro (HUMALOG) 100 UNIT/ML injection, Inject into the skin 3 (three) times daily before meals., Disp: , Rfl:  .  lactulose (CHRONULAC) 10 GM/15ML solution, Take 15 g by mouth daily. (0800), Disp: , Rfl:  .  lamoTRIgine (LAMICTAL) 100 MG tablet, Take 1 tablet (100 mg total) by mouth 2 (two) times daily. (0800 & 2000), Disp: 60 tablet, Rfl: 1 .  lamoTRIgine (LAMICTAL) 25 MG tablet, Take 1 tablet (25 mg total) by mouth daily. To be added to the Lamictal 100 mg in the AM, Disp: 30 tablet, Rfl: 1 .  lidocaine (LIDODERM) 5 %, Place 2 patches onto the skin daily. Remove & Discard patch within 12 hours or as directed by MD, Disp: , Rfl:  .  liraglutide (VICTOZA) 18 MG/3ML SOPN, Inject 1.8 mg into the skin daily., Disp: , Rfl:  .  LYRICA 150 MG capsule, Take 150 mg by mouth 3 (three) times daily. (0800, 1400 & 2000), Disp: , Rfl:  .  metFORMIN (GLUCOPHAGE-XR) 500 MG 24 hr tablet, Take 1 tablet by mouth each morning for 7 days, then increase to one tablet in morning and evening, Disp: , Rfl:  .  metoCLOPramide  (REGLAN) 5 MG tablet, Take 5 mg by mouth 4 (four) times daily -  before meals and at bedtime. (0800, 1200, 1600 & 2000), Disp: , Rfl:  .  mirtazapine (REMERON) 30 MG tablet, Take 1.5 tablets (45 mg total) by mouth at bedtime., Disp: 45 tablet, Rfl: 1 .  nystatin (MYCOSTATIN/NYSTOP) powder, Apply topically 4 (four) times daily., Disp: 15 g, Rfl: 0 .  omeprazole (PRILOSEC) 20 MG capsule, Take 20 mg by mouth daily. , Disp: , Rfl:  .  oxybutynin (DITROPAN-XL) 5 MG 24 hr tablet, Take 5 mg by mouth daily. (0800), Disp: , Rfl:  .  rOPINIRole (  REQUIP) 3 MG tablet, Take 3 mg by mouth at bedtime. (2000), Disp: , Rfl:  .  simvastatin (ZOCOR) 10 MG tablet, Take 10 mg by mouth at bedtime. (2000), Disp: , Rfl:  .  spironolactone (ALDACTONE) 50 MG tablet, Take 50 mg by mouth daily. (0800), Disp: , Rfl:  .  vitamin C (ASCORBIC ACID) 500 MG tablet, Take 500 mg by mouth 2 (two) times daily. (0800 & 2000), Disp: , Rfl:  .  zolpidem (AMBIEN) 5 MG tablet, Take 5 mg by mouth at bedtime. (2000), Disp: , Rfl:  .  budesonide-formoterol (SYMBICORT) 160-4.5 MCG/ACT inhaler, Inhale into the lungs., Disp: , Rfl:  .  HYDROcodone-acetaminophen (NORCO) 7.5-325 MG tablet, Take 1 tablet by mouth 3 (three) times daily as needed for moderate pain. For chronic pain To last for 30 days from fill date To fill on or after: 09/25/2017, 10/24/2017, Disp: 90 tablet, Rfl: 0 .  potassium chloride (K-DUR,KLOR-CON) 10 MEQ tablet, Take 1 tablet (10 mEq total) by mouth daily for 4 days., Disp: 4 tablet, Rfl: 0  ROS  Constitutional: Denies any fever or chills Gastrointestinal: No reported hemesis, hematochezia, vomiting, or acute GI distress Musculoskeletal: Denies any acute onset joint swelling, redness, loss of ROM, or weakness Neurological: No reported episodes of acute onset apraxia, aphasia, dysarthria, agnosia, amnesia, paralysis, loss of coordination, or loss of consciousness  Allergies  Ms. Canny is allergic to doxycycline.  [place]   Drug: Ms. Barkey  reports that she does not use drugs. Alcohol:  reports that she does not drink alcohol. Tobacco:  reports that she quit smoking about 4 years ago. Her smoking use included cigarettes. She has a 75.00 pack-year smoking history. She has never used smokeless tobacco. Medical:  has a past medical history of Adenomatous colon polyp (08/27/2014), Anemia, Anxiety, Aortic dissection ([place]), Arthritis, Asthma, Bipolar affective disorder ([place]), Blood transfusion (2000), Blood transfusion without reported diagnosis, Cancer ([place]), Chronic abdominal pain, Cirrhosis ([place]), Colon polyp, Depression, Diabetes mellitus without complication ([place]), Fatty liver (04/09/08), GERD (gastroesophageal reflux disease), H/O: CVA (cardiovascular accident), H/O: rheumatic fever, Hepatitis, Hyperlipidemia, Incontinence, Insomnia, Lower extremity edema, Obstructive sleep apnea, Platelets decreased ([place]), RLS (restless legs syndrome), Shortness of breath, Sleep apnea, Stroke ([place]), Ulcer, and Unspecified disorders of nervous system. Surgical: Ms. Finstad  has a past surgical history that includes Cholecystectomy; Rotator cuff repair; Abdominal hysterectomy; Lumbar fusion (10/09); Hemorroidectomy; Abdominal exploration surgery; Posterior cervical fusion/foraminotomy; Lumbar wound debridement (05/09/2011); Axillary artery cannulation via 8-mm Hemashield graft, median sternotomy, extracorporeal circulation with deep hypothermic circulatory arrest, repair of  aortic dessection (01/23/2009); cataract; Colonoscopy; Liver biopsy (04/10/2012); Back surgery; Eye surgery (Bilateral); and Knee arthroscopy with lateral release (Right, 11/15/2016). Family: family history includes Anxiety disorder in her brother; Breast cancer in her mother; Depression in her brother; Diabetes in her unknown relative; Lung cancer in her mother; Stomach cancer in her father.  Constitutional Exam  General appearance: Well nourished, well developed, and well  hydrated. In no apparent acute distress Vitals:   09/25/17 1259  BP: (!) 103/55  Pulse: 87  Resp: 18  Temp: 98.4 F (36.9 C)  SpO2: 95%  Weight: 220 lb (99.8 kg)  Height: 5' 3"  (1.6 m)   BMI Assessment: Estimated body mass index is 38.97 kg/m as calculated from the following:   Height as of this encounter: 5' 3"  (1.6 m).   Weight as of this encounter: 220 lb (99.8 kg).  BMI interpretation table: BMI level Category Range association with higher incidence of chronic pain  <18  kg/m2 Underweight   18.5-24.9 kg/m2 Ideal body weight   25-29.9 kg/m2 Overweight Increased incidence by 20%  30-34.9 kg/m2 Obese (Class I) Increased incidence by 68%  35-39.9 kg/m2 Severe obesity (Class II) Increased incidence by 136%  >40 kg/m2 Extreme obesity (Class III) Increased incidence by 254%   BMI Readings from Last 4 Encounters:  09/25/17 38.97 kg/m  09/13/17 40.74 kg/m  08/15/17 40.74 kg/m  08/14/17 40.74 kg/m   Wt Readings from Last 4 Encounters:  09/25/17 220 lb (99.8 kg)  09/13/17 230 lb (104.3 kg)  08/15/17 230 lb (104.3 kg)  08/14/17 230 lb (104.3 kg)  Psych/Mental status: Alert, oriented x 3 (person, place, & time)       Eyes: PERLA Respiratory: No evidence of acute respiratory distress  Cervical Spine Area Exam  Skin & Axial Inspection: No masses, redness, edema, swelling, or associated skin lesions Alignment: Symmetrical Functional ROM: Unrestricted ROM      Stability: No instability detected Muscle Tone/Strength: Functionally intact. No obvious neuro-muscular anomalies detected. Sensory (Neurological): Unimpaired Palpation: No palpable anomalies              Upper Extremity (UE) Exam    Side: Right upper extremity  Side: Left upper extremity  Skin & Extremity Inspection: Skin color, temperature, and hair growth are WNL. No peripheral edema or cyanosis. No masses, redness, swelling, asymmetry, or associated skin lesions. No contractures.  Skin & Extremity Inspection: Skin  color, temperature, and hair growth are WNL. No peripheral edema or cyanosis. No masses, redness, swelling, asymmetry, or associated skin lesions. No contractures.  Functional ROM: Unrestricted ROM          Functional ROM: Unrestricted ROM          Muscle Tone/Strength: Functionally intact. No obvious neuro-muscular anomalies detected.  Muscle Tone/Strength: Functionally intact. No obvious neuro-muscular anomalies detected.  Sensory (Neurological): Unimpaired          Sensory (Neurological): Unimpaired          Palpation: No palpable anomalies              Palpation: No palpable anomalies              Specialized Test(s): Deferred         Specialized Test(s): Deferred          Thoracic Spine Area Exam  Skin & Axial Inspection: No masses, redness, or swelling Alignment: Symmetrical Functional ROM: Unrestricted ROM Stability: No instability detected Muscle Tone/Strength: Functionally intact. No obvious neuro-muscular anomalies detected. Sensory (Neurological): Unimpaired Muscle strength & Tone: No palpable anomalies  Lumbar Spine Area Exam  Skin & Axial Inspection: No masses, redness, or swelling Alignment: Scoliosis detected Functional ROM: Decreased ROM       Stability: No instability detected Muscle Tone/Strength: Functionally intact. No obvious neuro-muscular anomalies detected. Sensory (Neurological): Movement-associated discomfort Palpation: Complains of area being tender to palpation       Provocative Tests: Lumbar Hyperextension and rotation test: evaluation deferred today       Lumbar Lateral bending test: evaluation deferred today       Patrick's Maneuver: evaluation deferred today                    Gait & Posture Assessment  Ambulation: Patient came in today in a wheel chair Gait: Very limited, using assistive device to ambulate Posture: Poor   Lower Extremity Exam    Side: Right lower extremity  Side: Left lower extremity  Skin & Extremity Inspection: Positive  color  changes mild erythema however improved from physical exam on 08/14/2017  Skin & Extremity Inspection: Skin color, temperature, and hair growth are WNL. No peripheral edema or cyanosis. No masses, redness, swelling, asymmetry, or associated skin lesions. No contractures.  Functional ROM: Diminished ROM for knee joint  Functional ROM: Unrestricted ROM          Muscle Tone/Strength: Functionally intact. No obvious neuro-muscular anomalies detected.  Muscle Tone/Strength: Functionally intact. No obvious neuro-muscular anomalies detected.  Sensory (Neurological): Musculoskeletal pain pattern arthropathic arthralgia.  Sensory (Neurological): Unimpaired  Palpation: Complains of area being tender to palpation  Palpation: No palpable anomalies   Assessment  Primary Diagnosis & Pertinent Problem List: The primary encounter diagnosis was Primary osteoarthritis of right knee. Diagnoses of Primary osteoarthritis of both knees, Fibromyalgia, Morbid obesity ([place]), Chronic pain syndrome, and Pain management contract agreement were also pertinent to this visit.  Status Diagnosis  Having a Flare-up Persistent Persistent 1. Primary osteoarthritis of right knee   2. Primary osteoarthritis of both knees   3. Fibromyalgia   4. Morbid obesity ([place])   5. Chronic pain syndrome   6. 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 Patient does have a history of fibromyalgia as well as 5 lumbar spine surgeries from 2011 to 2015. Patient is status post bilateral SI joint injections on 07/17/2017 which  were helpful for her buttock and groin pain.  Patient was originally scheduled for a right knee genicular nerve block for her right knee pain secondary to right knee osteoarthritis.  However at that time, her right knee was very erythematous and glossy appearing concerning for cellulitis.  She followed up with the ED and her redness has improved.  She presents today continuing to endorse right knee pain that is worse towards the medial aspect of her patella.  She has limited range of motion with right knee flexion and extension.  We discussed right knee genicular nerve block under fluoroscopy with sedation.  Risks and benefits were discussed.  Patient would like to proceed.  Patient has been receiving tramadol 100 mg 3 times daily at her assisted living facility.  She does not find this medication effective at all.  We discussed transitioning to low-dose hydrocodone 7.5 mg 3 times daily as needed for her moderate to severe pain.  I have recommended the patient discontinue her tramadol.  I also counseled the patient on avoiding any benzodiazepines while she is on opioid therapy to minimize the risk of respiratory depression and sedation.  This is especially important for the patient in the context of her morbid obesity and obstructive sleep apnea.  I also recommended the patient discontinue Lyrica with the help of her prescribing physician.  Patient is on both gabapentin and Lyrica which could be contributing to her lower extremity swelling.  Patient prefers gabapentin and finds this more effective.  I recommended the patient discuss this with her prescribing physician at the assisted living facility.  Plan: -Discontinue tramadol.  Hydrocodone 7.5 mg 3 times daily as needed for moderate to severe pain. Rx provided for 2 months -Schedule for right knee genicular nerve block under fluoroscopy with sedation -  Recommended the patient discuss with prescribing physician at assisted living facility regarding being  on either gabapentin OR  Lyrica since both agents can be contributing to her lower ext swelling. -Counseled the patient on not taking her hydrocodone with any benzodiazepine given risk of respiratory depression especially in the context of her morbid obesity and risk factors for OSA.   Plan of Care  Pharmacotherapy (Medications Ordered): Meds ordered this encounter  Medications  . DISCONTD: HYDROcodone-acetaminophen (NORCO) 7.5-325 MG tablet    Sig: Take 1 tablet by mouth 3 (three) times daily as needed for moderate pain. For chronic pain To last for 30 days from fill date To fill on or after: 09/25/2017, 10/24/2017    Dispense:  90 tablet    Refill:  0    Do not place this medication, or any other prescription from our practice, on "Automatic Refill". Patient may have prescription filled one day early if pharmacy is closed on scheduled refill date.  Marland Kitchen HYDROcodone-acetaminophen (NORCO) 7.5-325 MG tablet    Sig: Take 1 tablet by mouth 3 (three) times daily as needed for moderate pain. For chronic pain To last for 30 days from fill date To fill on or after: 09/25/2017, 10/24/2017    Dispense:  90 tablet    Refill:  0    Do not place this medication, or any other prescription from our practice, on "Automatic Refill". Patient may have prescription filled one day early if pharmacy is closed on scheduled refill date.   Lab-work, procedure(s), and/or referral(s): Orders Placed This Encounter  Procedures  . GENICULAR NERVE BLOCK   Provider-requested follow-up: Return for Procedure. Time Note: Greater than 50% of the 25 minute(s) of face-to-face time spent with Ms. Frieling, was spent in counseling/coordination of care regarding: the appropriate use of the pain scale, Ms. Sulton's primary cause of pain, the treatment plan, treatment alternatives, the risks and possible complications of proposed treatment, medication side effects, the opioid analgesic risks and possible complications, the appropriate  use of her medications, realistic expectations, the goals of pain management (increased in functionality), the need to bring and keep the BMI below 30, the medication agreement, the importance of providing [name] with accurate post-procedure information and the patient's responsibilities when it comes to controlled substances. Future Appointments  Date Time Provider [place]  10/04/2017 10:00 AM Gillis Santa, MD [facility] None  11/06/2017 10:45 AM Lequita Asal, MD [facility] None  11/16/2017 12:00 PM Gillis Santa, MD [facility] None  07/03/2018 10:15 AM Dew, Erskine Squibb, MD [facility] None    Primary Care Physician: Sarajane Jews, MD Location: [facility] Note by: Gillis Santa, M.D Date: 09/25/2017; Time: 2:52 PM  Patient Instructions  You have been given 2 scripts for Hydrocodone today.  STOP TRAMADOL.  You have been given pre procedure instructions with sedation.         1.  Since your right knee is looking better from a redness standpoint we will get you scheduled for right knee genicular nerve block with sedation under fluoroscopy. 2.  Please discontinue tramadol. New prescription for hydrocodone 7.5 mg 3 times daily as needed 3.  Follow-up for right knee genicular nerve block.Genicular Nerve Block  What is a genicular nerve block? A genicular nerve block is the injection of a local anesthetic to block the nerves that transmits pain from the knee.  What is the purpose of a facet nerve block? A genicular nerve block is a diagnostic procedure to determine if the pathologic changes (  i.e. arthritis, meniscal tears, etc) and inflammation within the knee joint is the source of your knee pain. It also confirms that the knee pain will respond well to the actual treatment procedure. If a genicular nerve block works, it will give you relief for several hours. After that, the pain is expected to return to normal. This test is always performed twice (usually a week  or two apart) because two successful tests are required to move onto treatment. If both diagnostic tests are positive, then we schedule a treatment called radiofrequency (RF) ablation. In this procedure, the same nerves are cauterized, which typically leads to pain relief for 4 -18 months. If this process works well for one knee, it can be performed on the other knee if needed.  How is the procedure performed? You will be placed on the procedure table. The injection site is sterilized with either iodine or chlorhexadine. The site to be injected is numbed with a local anesthetic, and a needle is directed to the target area. X-ray guidance is used to ensure proper placement and positioning of the needle. When the needle is properly positioned near the genicular nerve, local anesthetic is injected to numb that nerve. This will be repeated at multiple sites around the knee to block all genicular nerves.  Will the procedure be painful? The injection can be painful and we therefore provide the option of receiving IV sedation. IV sedation, combined with local anesthetic, can make the injection nearly pain free. It allows you to remain very still during the procedure, which can also make the injection easier, faster, and more successful. If you decide to have IV sedation, you must have a driver to get you home safely afterwards. In addition, you cannot have anything to eat or drink within 8 hours of your appointment (clear liquids are allowed until 3 hours before the procedure). If you take medications for diabetes, these medications may need to be adjusted the morning of the procedure. Your primary care physician can help you with this adjustment.  What are the discharge instructions? If you received IV sedation do not drive or operate machinery for at least 24 hours after the procedure. You may return to work the next day following your procedure. You may resume your normal diet immediately. Do not engage in any  strenuous activity for 24 hours. You should, however, engage in moderate activity that typically causes your ususal pain. If the block works, those activities should not be painful for several hours after the injection. Do not take a bath, swim, or use a hot tub for 24 hours (you may take a shower). Call the office if you have any of the following: severe pain afterwards (different than your usual symptoms), redness/swelling/discharge at the injection site(s), fevers/chills, difficulty with bowel or bladder functions.  What are the risks and side effects? The complication rate for this procedure is very low. Whenever a needle enters the skin, bleeding or infection can occur. Some other serious but extremely rare risks include paralysis and death. You may have an allergic reaction to any of the medications used. If you have a known allergy to any medications, especially local anesthetics, notify our staff before the procedure takes place. You may experience any of the following side effects up to 4 - 6 hours after the procedure: . Leg muscle weakness or numbness may occur due to the local anesthetic affecting the nerves that control your legs (this is a temporary affect and it is not paralysis). If  you have any leg weakness or numbness, walk only with assistance in order to prevent falls and injury. Your leg strength will return slowly and completely. . Dizziness may occur due to a decrease in your blood pressure. If this occurs, remain in a seated or lying position. Gradually sit up, and then stand after at least 10 minutes of sitting. . Mild headaches may occur. Drink fluids and take pain medications if needed. If the headaches persist or become severe, call the office. . Mild discomfort at the injection site can occur. This typically lasts for a few hours but can persist for a couple days. If this occurs, take anti-inflammatories or pain medications, apply ice to the area the day of the procedure. If it  persists, apply moist heat in the day(s) following.  The side effects listed above can be normal. They are not dangerous and will resolve on their own. If, however, you experience any of the following, a complication may have occurred and you should either contact your doctor. If he is not readily available, then you should proceed to the closest urgent care center for evaluation: . Severe or progressive pain at the injection site(s) . Arm or leg weakness that progressively worsens or persists for longer than 8 hours . Severe or progressive redness, swelling, or discharge from the injections site(s) . Fevers, chills, nausea, or vomiting . Bowel or bladder dysfunction (i.e. inability to urinate or pass stool or difficulty controlling either)  How long does it take for the procedure to work? You should feel relief from your usual pain within the first hour. Again, this is only expected to last for several hours, at the most. Remember, you may be sore in the middle part of your back from the needles, and you must distinguish this from your usual pain. Preparing for Procedure with Sedation Instructions: . Oral Intake: Do not eat or drink anything for at least 8 hours prior to your procedure. . Transportation: Public transportation is not allowed. Bring an adult driver. The driver must be physically present in our waiting room before any procedure can be started. Marland Kitchen Physical Assistance: Bring an adult capable of physically assisting you, in the event you need help. . Blood Pressure Medicine: Take your blood pressure medicine with a sip of water the morning of the procedure. . Insulin: Take only  of your normal insulin dose. . Preventing infections: Shower with an antibacterial soap the morning of your procedure. . Build-up your immune system: Take 1000 mg of Vitamin C with every meal (3 times a day) the day prior to your procedure. . Pregnancy: If you are pregnant, call and cancel the  procedure. . Sickness: If you have a cold, fever, or any active infections, call and cancel the procedure. . Arrival: You must be in the facility at least 30 minutes prior to your scheduled procedure. . Children: Do not bring children with you. . Dress appropriately: Bring dark clothing that you would not mind if they get stained. . Valuables: Do not bring any jewelry or valuables. Procedure appointments are reserved for interventional treatments only. Marland Kitchen No Prescription Refills. . No medication changes will be discussed during procedure appointments. . No disability issues will be discussed.

## 2017-09-26 ENCOUNTER — Telehealth: Payer: Self-pay

## 2017-10-02 ENCOUNTER — Telehealth: Payer: Self-pay | Admitting: *Deleted

## 2017-10-02 NOTE — Telephone Encounter (Signed)
Notified  Dr Holley Raring and he states he will not use the steroid. [place] notified.

## 2017-10-04 ENCOUNTER — Ambulatory Visit
Admission: RE | Admit: 2017-10-04 | Discharge: 2017-10-04 | Disposition: A | Discharge status: Home / Self Care | Payer: Medicare Other | Source: Ambulatory Visit | Attending: Student in an Organized Health Care Education/Training Program | Admitting: Student in an Organized Health Care Education/Training Program

## 2017-10-04 ENCOUNTER — Encounter: Payer: Self-pay | Admitting: Student in an Organized Health Care Education/Training Program

## 2017-10-04 ENCOUNTER — Ambulatory Visit (HOSPITAL_BASED_OUTPATIENT_CLINIC_OR_DEPARTMENT_OTHER): Payer: Medicare Other | Admitting: Student in an Organized Health Care Education/Training Program

## 2017-10-04 ENCOUNTER — Other Ambulatory Visit: Payer: Self-pay

## 2017-10-04 VITALS — BP 112/62 | HR 88 | Temp 98.2°F | Resp 16 | Ht 63.0 in | Wt 222.0 lb

## 2017-10-04 DIAGNOSIS — Z794 Long term (current) use of insulin: Secondary | ICD-10-CM | POA: Diagnosis not present

## 2017-10-04 DIAGNOSIS — M1711 Unilateral primary osteoarthritis, right knee: Secondary | ICD-10-CM | POA: Insufficient documentation

## 2017-10-04 DIAGNOSIS — Z79899 Other long term (current) drug therapy: Secondary | ICD-10-CM | POA: Insufficient documentation

## 2017-10-04 DIAGNOSIS — Z881 Allergy status to other antibiotic agents status: Secondary | ICD-10-CM | POA: Diagnosis not present

## 2017-10-04 LAB — GLUCOSE, CAPILLARY: GLUCOSE-CAPILLARY: 133 mg/dL — AB (ref 65–99)

## 2017-10-04 MED ORDER — ROPIVACAINE HCL 2 MG/ML IJ SOLN
10.0000 mL | Freq: Once | INTRAMUSCULAR | Status: AC
  Administered 2017-10-04: 10 mL
  Filled 2017-10-04: qty 10

## 2017-10-04 MED ORDER — LIDOCAINE HCL (PF) 1 % IJ SOLN
10.0000 mL | Freq: Once | INTRAMUSCULAR | Status: AC
  Administered 2017-10-04: 5 mL
  Filled 2017-10-04: qty 10

## 2017-10-04 MED ORDER — FENTANYL CITRATE (PF) 100 MCG/2ML IJ SOLN
25.0000 ug | INTRAMUSCULAR | Status: DC | PRN
  Administered 2017-10-04: 25 ug via INTRAVENOUS
  Filled 2017-10-04: qty 2

## 2017-10-04 MED ORDER — LACTATED RINGERS IV SOLN
1000.0000 mL | Freq: Once | INTRAVENOUS | Status: AC
  Administered 2017-10-04: 1000 mL via INTRAVENOUS

## 2017-10-04 NOTE — Progress Notes (Signed)
Safety precautions to be maintained throughout the outpatient stay will include: orient to surroundings, keep bed in low position, maintain call bell within reach at all times, provide assistance with transfer out of bed and ambulation.  

## 2017-10-04 NOTE — Progress Notes (Signed)
Patient's Name: Stephanie [name]  MRN: [id number]  Referring Provider: Sarajane Jews, MD  DOB: 1950-07-05  PCP: Sarajane Jews, MD  DOS: 10/04/2017  Note by: Gillis Santa, MD  Service setting: Ambulatory outpatient  Specialty: Interventional Pain Management  Patient type: Established  Location: [facility] (AMB) Pain Management Facility  Visit type: Interventional Procedure   Primary Reason for Visit: Interventional Pain Management Treatment. CC: Knee Pain (right)  Procedure:  Anesthesia, Analgesia, Anxiolysis:  Type: Genicular Nerves Block (Superior-lateral, Superior-medial, and Inferior-medial Genicular Nerves) #1  (LOCAL ANESTHETIC ONLY, NO STEROID) CPT: 64450      Primary Purpose: Diagnostic Region: Lateral, Anterior, and Medial aspects of the knee joint, above and below the knee joint proper. Level: Superior and inferior to the knee joint. Target Area: For Genicular Nerve block(s), the targets are: the superior-lateral genicular nerve, located in the lateral distal portion of the femoral shaft as it curves to form the lateral epicondyle, in the region of the distal femoral metaphysis; the superior-medial genicular nerve, located in the medial distal portion of the femoral shaft as it curves to form the medial epicondyle; and the inferior-medial genicular nerve, located in the medial, proximal portion of the tibial shaft, as it curves to form the medial epicondyle, in the region of the proximal tibial metaphysis. Approach: Anterior, percutaneous, ipsilateral approach. Laterality: Right knee Position: Modified Fowler's position with pillows under the targeted knee(s).  Type: Moderate (Conscious) Sedation combined with Local Anesthesia Indication(s): Analgesia and Anxiety Route: Intravenous (IV) IV Access: Secured Sedation: Meaningful verbal contact was maintained at all times during the procedure  Local Anesthetic: Lidocaine 1-2%   Indications: 1. Primary osteoarthritis of right knee    Pain  Score: Pre-procedure: 9 /10 Post-procedure: 0-No pain/10  Pre-op Assessment:  Stephanie [name] is a 67 y.o. (year old), female patient, seen today for interventional treatment. 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 Stephanie [name] has a current medication list which includes the following prescription(s): budesonide-formoterol, budesonide-formoterol, bupropion, clonazepam, dicyclomine, ferrous sulfate, furosemide, gabapentin, hydrocodone-acetaminophen, insulin glargine, insulin lispro, lactulose, lamotrigine, lamotrigine, lidocaine, liraglutide, lyrica, metformin, metoclopramide, mirtazapine, nystatin, omeprazole, oxybutynin, ropinirole, simvastatin, spironolactone, vitamin c, zolpidem, and potassium chloride, and the following Facility-Administered Medications: fentanyl. Her primarily concern today is the Knee Pain (right)  Initial Vital Signs:  Pulse/HCG Rate: 88ECG Heart Rate: 90 Temp: 98.2 F (36.8 C) Resp: 18 BP: (!) 111/54 SpO2: 100 %  BMI: Estimated body mass index is 39.33 kg/m as calculated from the following:   Height as of this encounter: 5' 3"  (1.6 m).   Weight as of this encounter: 222 lb (100.7 kg).  Risk Assessment: Allergies: Reviewed. She is allergic to doxycycline.  Allergy Precautions: None required Coagulopathies: Reviewed. None identified.  Blood-thinner therapy: None at this time Active Infection(s): Reviewed. None identified. Ms.  [name] is afebrile  Site Confirmation: Stephanie [name] was asked to confirm the procedure and laterality before marking the site Procedure checklist: Completed Consent: Before the procedure and under the influence of no sedative(s), amnesic(s), or anxiolytics, the patient was informed of the treatment options, risks and possible complications. To fulfill our ethical and legal obligations, as recommended by the  American Medical Association's Code of Ethics, I have informed the patient of my clinical impression; the nature and purpose of the treatment or procedure; the risks, benefits, and possible complications of the intervention; the alternatives, including doing nothing; the risk(s) and benefit(s) of the alternative treatment(s) or procedure(s); and the risk(s) and benefit(s) of doing nothing. The patient was provided information about the general risks and possible complications associated with the procedure. These may include, but are not limited to: failure to achieve desired goals, infection, bleeding, organ or nerve damage, allergic reactions, paralysis, and death. In addition, the patient was informed of those risks and complications associated to the procedure, such as failure to decrease pain; infection; bleeding; organ or nerve damage with subsequent damage to sensory, motor, and/or autonomic systems, resulting in permanent pain, numbness, and/or weakness of one or several areas of the body; allergic reactions; (i.e.: anaphylactic reaction); and/or death. Furthermore, the patient was informed of those risks and complications associated with the medications. These include, but are not limited to: allergic reactions (i.e.: anaphylactic or anaphylactoid reaction(s)); adrenal axis suppression; blood sugar elevation that in diabetics may result in ketoacidosis or comma; water retention that in patients with history of congestive heart failure may result in shortness of breath, pulmonary edema, and  decompensation with resultant heart failure; weight gain; swelling or edema; medication-induced neural toxicity; particulate matter embolism and blood vessel occlusion with resultant organ, and/or nervous system infarction; and/or aseptic necrosis of one or more joints. Finally, the patient was informed that Medicine is not an exact science; therefore, there is also the possibility of unforeseen or unpredictable risks and/or possible complications that may result in a catastrophic outcome. The patient indicated having understood very clearly. We have given the patient no guarantees and we have made no promises. Enough time was given to the patient to ask questions, all of which were answered to the patient's satisfaction. Ms. Minkoff has indicated that she wanted to continue with the procedure. Attestation: I, the ordering provider, attest that I have discussed with the patient the benefits, risks, side-effects, alternatives, likelihood of achieving goals, and potential problems during recovery for the procedure that I have provided informed consent. Date  Time: 10/04/2017  9:57 AM  Pre-Procedure Preparation:  Monitoring: As per clinic protocol. Respiration, ETCO2, SpO2, BP, heart rate and rhythm monitor placed and checked for adequate function Safety Precautions: Patient was assessed for positional comfort and pressure points before starting the procedure. Time-out: I initiated and conducted the "Time-out" before starting the procedure, as per protocol. The patient was asked to participate by confirming the accuracy of the "Time Out" information. Verification of the correct person, site, and procedure were performed and confirmed by me, the nursing staff, and the patient. "Time-out" conducted as per Joint Commission's Universal Protocol (UP.01.01.01). Time: 1056  Description of Procedure Process:  Area Prepped: Entire knee area, from mid-thigh to mid-shin, lateral, anterior, and medial aspects. Prepping  solution: ChloraPrep (2% chlorhexidine gluconate and 70% isopropyl alcohol) Safety Precautions: Aspiration looking for blood return was conducted prior to all injections. At no point did we inject any substances, as a needle was being advanced. No attempts were made at seeking any paresthesias. Safe injection practices and needle disposal techniques used. Medications properly checked for expiration dates. SDV (single dose vial) medications used. Latex Allergy precautions taken.   Description of the Procedure: Protocol guidelines were followed. The patient was placed in position over the procedure table. The target area was identified and the area prepped in the  usual manner. Skin desensitized using vapocoolant spray. Skin & deeper tissues infiltrated with local anesthetic. Appropriate amount of time allowed to pass for local anesthetics to take effect. The procedure needles were then advanced to the target area. Proper needle placement secured. Negative aspiration confirmed. Solution injected in intermittent fashion, asking for systemic symptoms every 0.5cc of injectate. The needles were then removed and the area cleansed, making sure to leave some of the prepping solution back to take advantage of its long term bactericidal properties.  Vitals:   10/04/17 1106 10/04/17 1116 10/04/17 1126 10/04/17 1136  BP: 115/65 (!) 106/40 (!) 101/59 112/62  Pulse:      Resp: 12 14 16 16   Temp:      SpO2: 98% 99% 97% 98%  Weight:      Height:        Start Time: 1056 hrs. End Time: 1114 hrs. Materials:  Needle(s) Type: Regular needle Gauge: 22G Length: 3.5-in Medication(s): Please see orders for medications and dosing details. 2 cc of 0.2% ropivacaine injected at each level. Imaging Guidance (Non-Spinal):  Type of Imaging Technique: Fluoroscopy Guidance (Non-Spinal) Indication(s): Assistance in needle guidance and placement for procedures requiring needle placement in or near specific anatomical locations  not easily accessible without such assistance. Exposure Time: Please see nurses notes. Contrast: Before injecting any contrast, we confirmed that the patient did not have an allergy to iodine, shellfish, or radiological contrast. Once satisfactory needle placement was completed at the desired level, radiological contrast was injected. Contrast injected under live fluoroscopy. No contrast complications. See chart for type and volume of contrast used. Fluoroscopic Guidance: I was personally present during the use of fluoroscopy. "Tunnel Vision Technique" used to obtain the best possible view of the target area. Parallax error corrected before commencing the procedure. "Direction-depth-direction" technique used to introduce the needle under continuous pulsed fluoroscopy. Once target was reached, antero-posterior, oblique, and lateral fluoroscopic projection used confirm needle placement in all planes. Images permanently stored in EMR. Interpretation: I personally interpreted the imaging intraoperatively. Adequate needle placement confirmed in multiple planes. Appropriate spread of contrast into desired area was observed. No evidence of afferent or efferent intravascular uptake. Permanent images saved into the patient's record.  Antibiotic Prophylaxis:   Anti-infectives (From admission, onward)   None     Indication(s): None identified  Post-operative Assessment:  Post-procedure Vital Signs:  Pulse/HCG Rate: 8888 Temp: 98.2 F (36.8 C) Resp: 16 BP: 112/62 SpO2: 98 %  EBL: None  Complications: No immediate post-treatment complications observed by team, or reported by patient.  Note: The patient tolerated the entire procedure well. A repeat set of vitals were taken after the procedure and the patient was kept under observation following institutional policy, for this type of procedure. Post-procedural neurological assessment was performed, showing return to baseline, prior to discharge. The  patient was provided with post-procedure discharge instructions, including a section on how to identify potential problems. Should any problems arise concerning this procedure, the patient was given instructions to immediately contact us, at any time, without hesitation. In any case, we plan to contact the patient by telephone for a follow-up status report regarding this interventional procedure.  Comments:  No additional relevant information.  Plan of Care   Imaging Orders     DG C-Arm 1-60 Min-No Report Procedure Orders    No procedure(s) ordered today    Medications ordered for procedure: Meds ordered this encounter  Medications  . fentaNYL (SUBLIMAZE) injection 25-100 mcg    Make sure Narcan  is available in the pyxis when using this medication. In the event of respiratory depression (RR< 8/min): Titrate NARCAN (naloxone) in increments of 0.1 to 0.2 mg IV at 2-3 minute intervals, until desired degree of reversal.  . lactated ringers infusion 1,000 mL  . ropivacaine (PF) 2 mg/mL (0.2%) (NAROPIN) injection 10 mL  . lidocaine (PF) (XYLOCAINE) 1 % injection 10 mL   Medications administered: We administered fentaNYL, lactated ringers, ropivacaine (PF) 2 mg/mL (0.2%), and lidocaine (PF).  See the medical record for exact dosing, route, and time of administration.  New Prescriptions   No medications on file   Disposition: Discharge home  Discharge Date & Time: 10/04/2017; 1137 hrs.   Future Appointments  Date Time Provider [place]  10/12/2017  1:30 PM Ursula Alert, MD [facility] None  11/06/2017 10:45 AM Lequita Asal, MD [facility] None  11/06/2017  3:00 PM Lubertha South, LCSW [facility] None  11/16/2017 12:00 PM Gillis Santa, MD [facility] None  07/03/2018 10:15 AM Dew, Erskine Squibb, MD [facility] None   Primary Care Physician: Sarajane Jews, MD Location: [facility] Note by: Gillis Santa, MD Date: 10/04/2017; Time: 1:02 PM  Disclaimer:   Medicine is not an exact science. The only guarantee in medicine is that nothing is guaranteed. It is important to note that the decision to proceed with this intervention was based on the information collected from the patient. The Data and conclusions were drawn from the patient's questionnaire, the interview, and the physical examination. Because the information was provided in large part by the patient, it cannot be guaranteed that it has not been purposely or unconsciously manipulated. Every effort has been made to obtain as much relevant data as possible for this evaluation. It is important to note that the conclusions that lead to this procedure are derived in large part from the available data. Always take into account that the treatment will also be dependent on availability of resources and existing treatment guidelines, considered by other Pain Management Practitioners as being common knowledge and practice, at the time of the intervention. For Medico-Legal purposes, it is also important to point out that variation in procedural techniques and pharmacological choices are the acceptable norm. The indications, contraindications, technique, and results of the above procedure should only be interpreted and judged by a Board-Certified Interventional Pain Specialist with extensive familiarity and expertise in the same exact procedure and technique.

## 2017-10-04 NOTE — Patient Instructions (Signed)

## 2017-10-05 ENCOUNTER — Telehealth: Payer: Self-pay | Admitting: *Deleted

## 2017-10-05 NOTE — Telephone Encounter (Signed)
Spoke with patient, multiple questions answered about procedure.  Patient verbalizes u/o information.

## 2017-10-06 IMAGING — CR DG KNEE 1-2V*L*
2 series · 2 of 2 positions shown · non-contrast
Comparison: 01/31/2016

CLINICAL DATA: Left knee pain.  No known injury.

EXAM:
LEFT KNEE - 1-2 VIEW

[knee ap]
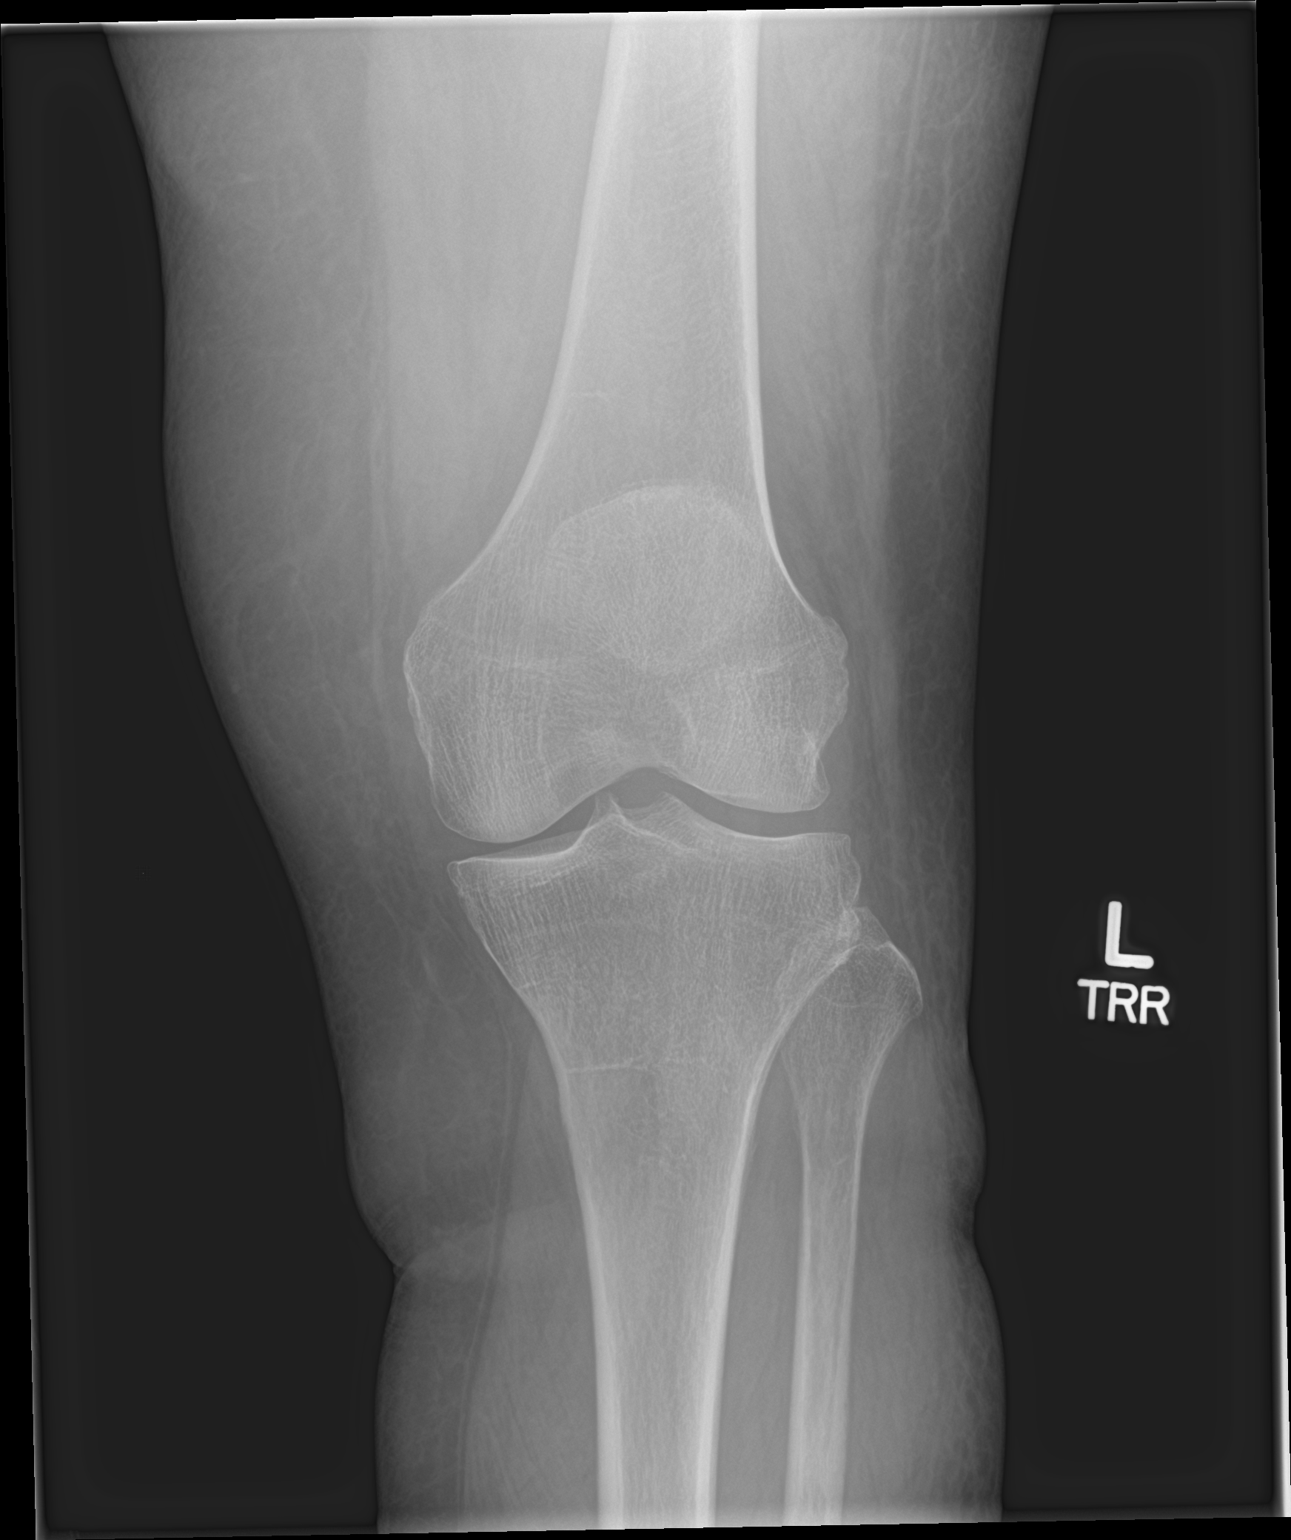

[knee lat]
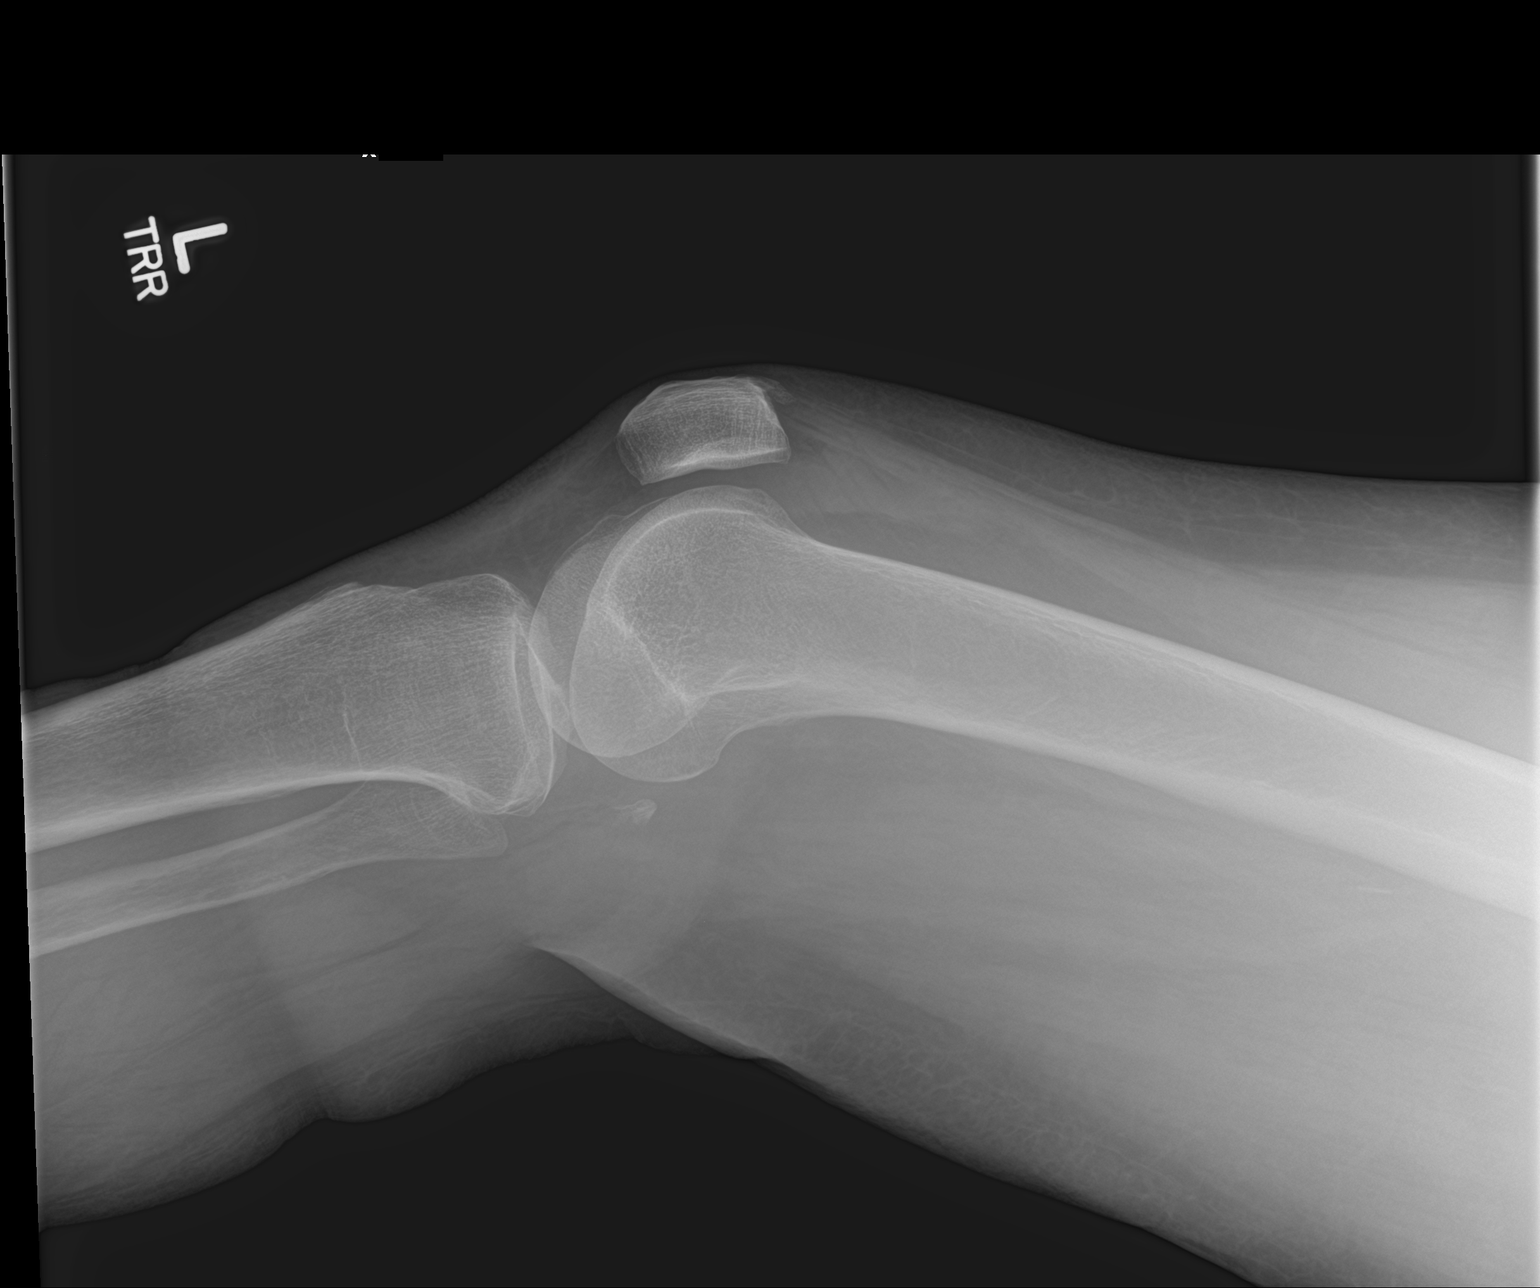

[2 of 2 positions shown; findings below may reference images not displayed]

FINDINGS: No evidence of fracture, dislocation, or joint effusion. No evidence
of arthropathy or other focal bone abnormality. Soft tissues are
unremarkable.
IMPRESSION: Negative.

## 2017-10-09 NOTE — Progress Notes (Signed)
   THERAPIST PROGRESS NOTE  Session Time: 44mn  Participation Level: Active  Behavioral Response: CasualAlertEuthymic  Type of Therapy: Individual Therapy  Treatment Goals addressed: Coping  Interventions: CBT and Supportive  Summary: Stephanie SNEERINGERis a 67y.o. female who presents with symptoms of her diagnosis.  Patient reports that she is unsure why she is in the office today.  She denies having MH concerns.  She reports that she has had a hard time with managing her medication and taking care of herself.  She reports that she struggles with the death of her son.  She reports that she wants grief and loss counseling.  Therapist assisted Patient with processing her thoughts and recalling her medication.  Therapist discussed with Patient the Stages of Grief.   Suicidal/Homicidal: No   Plan: Return again in 2 weeks.  Diagnosis: Axis I: Bipolar, mixed    Axis II: No diagnosis    NLubertha South LCSW 09/25/2017

## 2017-10-10 ENCOUNTER — Telehealth: Payer: Self-pay | Admitting: Student in an Organized Health Care Education/Training Program

## 2017-10-10 NOTE — Telephone Encounter (Signed)
Patient called stating she is in a lot of pain, sharp pains shooting down leg into ankle. Procedure helped a couple days then this started. Please call patient.

## 2017-10-10 NOTE — Telephone Encounter (Signed)
Dr. Holley Raring spoke with patient and answered her questions and concerns. Patient has follow up with him next week.

## 2017-10-12 ENCOUNTER — Encounter: Payer: Self-pay | Admitting: Psychiatry

## 2017-10-12 ENCOUNTER — Ambulatory Visit (INDEPENDENT_AMBULATORY_CARE_PROVIDER_SITE_OTHER): Payer: Medicare Other | Admitting: Psychiatry

## 2017-10-12 VITALS — BP 87/51 | HR 88

## 2017-10-12 DIAGNOSIS — F5105 Insomnia due to other mental disorder: Secondary | ICD-10-CM | POA: Diagnosis not present

## 2017-10-12 DIAGNOSIS — F3130 Bipolar disorder, current episode depressed, mild or moderate severity, unspecified: Secondary | ICD-10-CM

## 2017-10-12 DIAGNOSIS — F411 Generalized anxiety disorder: Secondary | ICD-10-CM | POA: Diagnosis not present

## 2017-10-12 DIAGNOSIS — G894 Chronic pain syndrome: Secondary | ICD-10-CM | POA: Diagnosis not present

## 2017-10-12 MED ORDER — MIRTAZAPINE 30 MG PO TABS: 45.0000 mg | ORAL_TABLET | Freq: Every day | ORAL | 1 refills | Status: DC

## 2017-10-12 MED ORDER — LAMOTRIGINE 25 MG PO TABS: 25.0000 mg | ORAL_TABLET | Freq: Every day | ORAL | 1 refills | Status: DC

## 2017-10-12 MED ORDER — LAMOTRIGINE 100 MG PO TABS: 100.0000 mg | ORAL_TABLET | Freq: Two times a day (BID) | ORAL | 1 refills | Status: DC

## 2017-10-12 MED ORDER — BUPROPION HCL ER (XL) 150 MG PO TB24: 150.0000 mg | ORAL_TABLET | Freq: Every day | ORAL | 1 refills | Status: DC

## 2017-10-12 NOTE — Progress Notes (Signed)
[facility] MD OP Progress Note  10/12/2017 2:36 PM Stephanie [name]  MRN:  [id number]  Chief Complaint: ' I am here for follow up.' Chief Complaint    Follow-up     HPI: Stephanie [name] is a 67 year old Caucasian female, divorced, lives at the [facility], on [place], has a history of bipolar disorder, generalized anxiety disorder, RLS, chronic pain, insomnia as well as multiple medical problems including COPD, history of lung nodule, sleep apnea, ascites, status post cholecystectomy, history of hepatitis B, history of stroke, history of vitamin D deficiency, history of aortic dissection, hyperlipidemia, hypertension, thrombocytopenia, chronic anemia, presented to the clinic today for a follow-up visit.  Patient today reports she has been struggling with a lot of pain.  She reports the pain is so severe that her medications were readjusted recently.  She continues to follow-up with her pain provider.  Patient reports due to the pain her sleep has been restless.  She also reports she has been worrying a lot about her health.  She reports she has not been doing the activities that she used to enjoy before due to her pain.  Patient is currently working with PT.  Patient reports she is compliant with her other medications.  She denies any side effects.  Patient reports since she is in pain and she is also on new narcotic medication she feels drowsy during the day.  She has been sleeping more during the day.  She denies any suicidality or perceptual disturbances.  Continues to stay at the assisted living [facility].Pt today presented with staff at her facility  Visit Diagnosis:    ICD-10-CM   1. Bipolar I disorder, most recent episode depressed (HCC) F31.30 mirtazapine (REMERON) 30 MG tablet    lamoTRIgine (LAMICTAL) 100 MG tablet    lamoTRIgine (LAMICTAL) 25 MG tablet  2. GAD (generalized anxiety disorder) F41.1   3. Insomnia due to mental disorder F51.05   4. Chronic pain syndrome G89.4      Past Psychiatric History: I have reviewed past psychiatric history from my progress note on 08/24/2017.  Past trials of Seroquel, trazodone, Ambien.  Past Medical History:  Past Medical History:  Diagnosis Date  . Adenomatous colon polyp 08/27/2014   Polyps x 3  . Anemia   . Anxiety   . 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 Arthritis   . Asthma   . Bipolar affective disorder ([place])    h/o  . Blood transfusion 2000  . Blood transfusion without reported diagnosis   . Cancer ([place])   . Chronic abdominal pain    abdominal wall pain  . 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 Colon polyp   . Depression   . Diabetes mellitus without complication ([place])   . Fatty liver 04/09/08   found in abd CT  . GERD (gastroesophageal reflux disease)   . H/O: CVA (cardiovascular accident)    TIA  . H/O: rheumatic fever   . Hepatitis    HEP "B" twenty years ago  . Hyperlipidemia   . Incontinence   . Insomnia   . Lower extremity edema   . Obstructive sleep apnea    Use C-PAP twice per week  . Platelets decreased ([place])   . RLS (restless legs syndrome)   . Shortness of breath   . Sleep apnea   . Stroke [facility])    TIA- 2002  . Ulcer   . Unspecified disorders of nervous system  Past Surgical History:  Procedure Laterality Date  . ABDOMINAL EXPLORATION SURGERY    . ABDOMINAL HYSTERECTOMY     total  . Axillary artery cannulation via 8-mm Hemashield graft, median sternotomy, extracorporeal circulation with deep hypothermic circulatory arrest, repair of  aortic dessection  01/23/2009   Dr Arlyce Dice  . BACK SURGERY      x 5  . cataract     both eyes  . CHOLECYSTECTOMY    . COLONOSCOPY    . EYE SURGERY Bilateral    Cataract Extraction with IOL  . HEMORROIDECTOMY     and colon polyp removed  . KNEE ARTHROSCOPY WITH LATERAL RELEASE Right 11/15/2016   Procedure: KNEE ARTHROSCOPY WITH LATERAL RELEASE;  Surgeon: Corky Mull, MD;  Location: [facility];  Service: Orthopedics;  Laterality: Right;   . LIVER BIOPSY  04/10/2012   Procedure: LIVER BIOPSY;  Surgeon: Inda Castle, MD;  Location: [facility];  Service: Endoscopy;  Laterality: N/A;  ultrasound to mark order for abd limited/liver to be marked to be sent by [email or web address]   . LUMBAR FUSION  10/09  . LUMBAR WOUND DEBRIDEMENT  05/09/2011   Procedure: LUMBAR WOUND DEBRIDEMENT;  Surgeon: Dahlia Bailiff;  Location: [facility];  Service: Orthopedics;  Laterality: N/A;  IRRIGATION AND DEBRIDEMENT SPINAL WOUND  . POSTERIOR CERVICAL FUSION/FORAMINOTOMY    . ROTATOR CUFF REPAIR     left    Family Psychiatric History: Have reviewed family psychiatric history from my progress note on 08/24/2017  Family History:  Family History  Problem Relation Age of Onset  . Breast cancer Mother   . Lung cancer Mother   . Stomach cancer Father   . Diabetes Unknown        4 aunts, and 1 uncle  . Depression Brother   . Anxiety disorder Brother   . Colon cancer Neg Hx   . Esophageal cancer Neg Hx   . Rectal cancer Neg Hx     Social History: She is divorced.  She lives at the [facility] assisted living facility in [place].  She is on SSD.  She had 2 sons but both of them passed away.  She has 8 grade education. Social History   Socioeconomic History  . Marital status: Divorced    Spouse name: Not on file  . Number of children: 2  . Years of education: Not on file  . Highest education level: 8th grade  Occupational History  . Occupation: Disabled    Employer: [employer]  Social Needs  . Financial resource strain: Not hard at all  . Food insecurity:    Worry: Never true    Inability: Never true  . Transportation needs:    Medical: No    Non-medical: No  Tobacco Use  . Smoking status: Former Smoker    Packs/day: 1.50    Years: 50.00    Pack years: 75.00    Types: Cigarettes    Last attempt to quit: 04/06/2013    Years since quitting: 4.5  . Smokeless tobacco: Never Used  Substance and Sexual Activity  . Alcohol use: No     Alcohol/week: 0.0 oz  . Drug use: No  . Sexual activity: Never  Lifestyle  . Physical activity:    Days per week: 0 days    Minutes per session: 0 min  . Stress: Very much  Relationships  . Social connections:    Talks on phone: Not on file    Gets together: Not on file  Attends religious service: Never    Active member of club or organization: No    Attends meetings of clubs or organizations: Never    Relationship status: Divorced  Other Topics Concern  . Not on file  Social History Narrative   1 son, 41+ y/o   Daily caffeine use: 2 cups daily   Does not get regular exercise   Disability due to bipolar      Has a living will- she is a DNR (she has form at home per pt).    Allergies:  Allergies  Allergen Reactions  . Doxycycline Hives, Swelling, Other (See Comments) and Nausea Only    Reaction:  Facial swelling  Face red and swollen; no difficulty breathing    Metabolic Disorder Labs: Lab Results  Component Value Date   HGBA1C 5.2 07/20/2016   MPG 103 07/20/2016   MPG 131 04/18/2016   No results found for: PROLACTIN Lab Results  Component Value Date   CHOL 194 04/05/2016   TRIG 47 04/05/2016   HDL 110 04/05/2016   CHOLHDL 1.8 04/05/2016   VLDL 9 04/05/2016   LDLCALC 75 04/05/2016   LDLCALC 59 12/15/2014   Lab Results  Component Value Date   TSH 4.486 07/19/2017   TSH 4.24 12/15/2014    Therapeutic Level Labs: No results found for: LITHIUM No results found for: VALPROATE No components found for:  CBMZ  Current Medications: Current Outpatient Medications  Medication Sig Dispense Refill  . budesonide-formoterol (SYMBICORT) 160-4.5 MCG/ACT inhaler Inhale 2 puffs into the lungs 2 (two) times daily. (0800 & 2000)    . budesonide-formoterol (SYMBICORT) 160-4.5 MCG/ACT inhaler Inhale into the lungs.    Marland Kitchen buPROPion (WELLBUTRIN XL) 150 MG 24 hr tablet Take 1 tablet (150 mg total) by mouth daily. 90 tablet 1  . clonazepam (KLONOPIN) 0.125 MG disintegrating  tablet Take 0.25 mg by mouth 3 (three) times daily.     Marland Kitchen dicyclomine (BENTYL) 10 MG capsule Take 10 mg by mouth 4 (four) times daily -  before meals and at bedtime.    . ferrous sulfate 325 (65 FE) MG tablet Take 325 mg by mouth daily with breakfast.    . furosemide (LASIX) 80 MG tablet Take 80 mg by mouth.    . gabapentin (NEURONTIN) 600 MG tablet Take 600 mg by mouth 2 (two) times daily.    Marland Kitchen HYDROcodone-acetaminophen (NORCO) 7.5-325 MG tablet Take 1 tablet by mouth 3 (three) times daily as needed for moderate pain. For chronic pain To last for 30 days from fill date To fill on or after: 09/25/2017, 10/24/2017 90 tablet 0  . insulin glargine (LANTUS) 100 UNIT/ML injection Inject 10-33 Units into the skin at bedtime.    . insulin lispro (HUMALOG) 100 UNIT/ML injection Inject into the skin 3 (three) times daily before meals.    . lactulose (CHRONULAC) 10 GM/15ML solution Take 15 g by mouth daily. (0800)    . lamoTRIgine (LAMICTAL) 100 MG tablet Take 1 tablet (100 mg total) by mouth 2 (two) times daily. (0800 & 2000) 180 tablet 1  . lamoTRIgine (LAMICTAL) 25 MG tablet Take 1 tablet (25 mg total) by mouth daily. To be added to the Lamictal 100 mg in the AM 90 tablet 1  . lidocaine (LIDODERM) 5 % Place 2 patches onto the skin daily. Remove & Discard patch within 12 hours or as directed by MD    . liraglutide (VICTOZA) 18 MG/3ML SOPN Inject 1.8 mg into the skin daily.    Marland Kitchen  LYRICA 150 MG capsule Take 150 mg by mouth 3 (three) times daily. (0800, 1400 & 2000)    . metFORMIN (GLUCOPHAGE-XR) 500 MG 24 hr tablet Take 1 tablet by mouth each morning for 7 days, then increase to one tablet in morning and evening    . metoCLOPramide (REGLAN) 5 MG tablet Take 5 mg by mouth 4 (four) times daily -  before meals and at bedtime. (0800, 1200, 1600 & 2000)    . mirtazapine (REMERON) 30 MG tablet Take 1.5 tablets (45 mg total) by mouth at bedtime. 135 tablet 1  . nystatin (MYCOSTATIN/NYSTOP) powder Apply topically 4  (four) times daily. 15 g 0  . omeprazole (PRILOSEC) 20 MG capsule Take 20 mg by mouth daily.     Marland Kitchen oxybutynin (DITROPAN-XL) 5 MG 24 hr tablet Take 5 mg by mouth daily. (0800)    . rOPINIRole (REQUIP) 3 MG tablet Take 3 mg by mouth at bedtime. (2000)    . simvastatin (ZOCOR) 10 MG tablet Take 10 mg by mouth at bedtime. (2000)    . spironolactone (ALDACTONE) 50 MG tablet Take 50 mg by mouth daily. (0800)    . vitamin C (ASCORBIC ACID) 500 MG tablet Take 500 mg by mouth 2 (two) times daily. (0800 & 2000)    . zolpidem (AMBIEN) 5 MG tablet Take 5 mg by mouth at bedtime. (2000)    . potassium chloride (K-DUR,KLOR-CON) 10 MEQ tablet Take 1 tablet (10 mEq total) by mouth daily for 4 days. 4 tablet 0   No current facility-administered medications for this visit.      Musculoskeletal: Strength & Muscle Tone: within normal limits Gait & Station: wheel chair bound Patient leans: N/A  Psychiatric Specialty Exam: Review of Systems  Psychiatric/Behavioral: Positive for depression. The patient is nervous/anxious.   All other systems reviewed and are negative.   Blood pressure (!) 87/51, pulse 88.There is no height or weight on file to calculate BMI.  General Appearance: Casual  Eye Contact:  Fair  Speech:  Clear and Coherent  Volume:  Normal  Mood:  Anxious and Dysphoric  Affect:  Tearful  Thought Process:  Goal Directed and Descriptions of Associations: Intact  Orientation:  Full (Time, Place, and Person)  Thought Content: Logical   Suicidal Thoughts:  No  Homicidal Thoughts:  No  Memory:  Immediate;   Fair Recent;   Fair Remote;   Fair  Judgement:  Fair  Insight:  Fair  Psychomotor Activity:  Normal  Concentration:  Concentration: Fair and Attention Span: Fair  Recall:  [employer] of Knowledge: Fair  Language: Fair  Akathisia:  No  Handed:  Right  AIMS (if indicated): na  Assets:  Communication Skills Desire for Improvement Social Support  ADL's:  Intact  Cognition: WNL   Sleep:  affected due to pain   Screenings: PHQ2-9     Procedure visit from 10/04/2017 in [facility] Office Visit from 09/25/2017 in [facility] Visit from 08/14/2017 in [facility] Procedure visit from 07/17/2017 in [facility] Office Visit from 05/23/2017 in [facility]  PHQ-2 Total Score  0  0  0  0  0       Assessment and Plan: Maddox is a 67 year old Caucasian female, divorced, on SSD, has a history of bipolar disorder, anxiety disorder, insomnia, RLS, sleep apnea, multiple medical problems  like history of stroke, hepatitis B, thrombocytopenia, COPD, ascites, hyperlipidemia, hypertension, aortic dissection, vitamin D deficiency, presented to the clinic today for a follow-up visit.  Patient presented with staff at the assisted living facility.  Patient today reports she continues to struggle with a lot of pain which is affecting her mood as well as sleep.  Discussed plan as noted below.  Plan Bipolar disorder Continue Lamictal 125 mg p.o. every morning and 100 mg p.o. every afternoon Continue Wellbutrin XL 150 mg p.o. daily  For GAD Discussed to taper of Klonopin 0.25 mg p.o. 3 times daily to twice daily.  Patient is being continued on Klonopin by her provider at the facility. Continue Remeron 45 mg p.o. nightly Patient will continue to see Ms. Peacock here in clinic for therapy  For insomnia Continue Remeron 45 mg p.o. Nightly  Discussed with patient about her pain affecting her mood as well as polypharmacy which can have an effect on her cognitive functioning.  Patient will continue to follow-up with her pain provider for management of pain which is a major concern for her.  Follow-up in clinic in 6-8 weeks or sooner if needed.  More than 50 % of the time was  spent for psychoeducation and supportive psychotherapy and care coordination.  This note was generated in part or whole with voice recognition software. Voice recognition is usually quite accurate but there are transcription errors that can and very often do occur. I apologize for any typographical errors that were not detected and corrected.       Ursula Alert, MD 10/12/2017, 2:36 PM

## 2017-10-16 NOTE — Telephone Encounter (Signed)
error 

## 2017-10-18 ENCOUNTER — Other Ambulatory Visit: Payer: Self-pay

## 2017-10-18 ENCOUNTER — Ambulatory Visit: Payer: Medicare Other | Attending: Nurse Practitioner | Admitting: Nurse Practitioner

## 2017-10-18 ENCOUNTER — Encounter: Payer: Self-pay | Admitting: Nurse Practitioner

## 2017-10-18 VITALS — BP 113/63 | HR 84 | Temp 98.0°F | Resp 18 | Ht 63.0 in | Wt 222.0 lb

## 2017-10-18 DIAGNOSIS — M25561 Pain in right knee: Secondary | ICD-10-CM

## 2017-10-18 DIAGNOSIS — E1165 Type 2 diabetes mellitus with hyperglycemia: Secondary | ICD-10-CM | POA: Diagnosis not present

## 2017-10-18 DIAGNOSIS — Z66 Do not resuscitate: Secondary | ICD-10-CM | POA: Diagnosis not present

## 2017-10-18 DIAGNOSIS — G4700 Insomnia, unspecified: Secondary | ICD-10-CM | POA: Diagnosis not present

## 2017-10-18 DIAGNOSIS — D696 Thrombocytopenia, unspecified: Secondary | ICD-10-CM | POA: Diagnosis not present

## 2017-10-18 DIAGNOSIS — Z79899 Other long term (current) drug therapy: Secondary | ICD-10-CM | POA: Insufficient documentation

## 2017-10-18 DIAGNOSIS — Z6839 Body mass index (BMI) 39.0-39.9, adult: Secondary | ICD-10-CM | POA: Diagnosis not present

## 2017-10-18 DIAGNOSIS — E559 Vitamin D deficiency, unspecified: Secondary | ICD-10-CM | POA: Insufficient documentation

## 2017-10-18 DIAGNOSIS — K7581 Nonalcoholic steatohepatitis (NASH): Secondary | ICD-10-CM | POA: Diagnosis not present

## 2017-10-18 DIAGNOSIS — I890 Lymphedema, not elsewhere classified: Secondary | ICD-10-CM | POA: Diagnosis not present

## 2017-10-18 DIAGNOSIS — G2581 Restless legs syndrome: Secondary | ICD-10-CM | POA: Insufficient documentation

## 2017-10-18 DIAGNOSIS — I6523 Occlusion and stenosis of bilateral carotid arteries: Secondary | ICD-10-CM | POA: Insufficient documentation

## 2017-10-18 DIAGNOSIS — G894 Chronic pain syndrome: Secondary | ICD-10-CM | POA: Diagnosis not present

## 2017-10-18 DIAGNOSIS — G8929 Other chronic pain: Secondary | ICD-10-CM

## 2017-10-18 DIAGNOSIS — I10 Essential (primary) hypertension: Secondary | ICD-10-CM | POA: Diagnosis not present

## 2017-10-18 DIAGNOSIS — D509 Iron deficiency anemia, unspecified: Secondary | ICD-10-CM | POA: Insufficient documentation

## 2017-10-18 DIAGNOSIS — F319 Bipolar disorder, unspecified: Secondary | ICD-10-CM | POA: Insufficient documentation

## 2017-10-18 DIAGNOSIS — E785 Hyperlipidemia, unspecified: Secondary | ICD-10-CM | POA: Insufficient documentation

## 2017-10-18 DIAGNOSIS — G4733 Obstructive sleep apnea (adult) (pediatric): Secondary | ICD-10-CM | POA: Insufficient documentation

## 2017-10-18 DIAGNOSIS — E1140 Type 2 diabetes mellitus with diabetic neuropathy, unspecified: Secondary | ICD-10-CM | POA: Diagnosis not present

## 2017-10-18 DIAGNOSIS — K7460 Unspecified cirrhosis of liver: Secondary | ICD-10-CM | POA: Insufficient documentation

## 2017-10-18 DIAGNOSIS — Z87891 Personal history of nicotine dependence: Secondary | ICD-10-CM | POA: Insufficient documentation

## 2017-10-18 DIAGNOSIS — F419 Anxiety disorder, unspecified: Secondary | ICD-10-CM | POA: Diagnosis not present

## 2017-10-18 DIAGNOSIS — Z8673 Personal history of transient ischemic attack (TIA), and cerebral infarction without residual deficits: Secondary | ICD-10-CM | POA: Insufficient documentation

## 2017-10-18 DIAGNOSIS — K219 Gastro-esophageal reflux disease without esophagitis: Secondary | ICD-10-CM | POA: Insufficient documentation

## 2017-10-18 DIAGNOSIS — M545 Low back pain: Secondary | ICD-10-CM | POA: Diagnosis not present

## 2017-10-18 DIAGNOSIS — Z881 Allergy status to other antibiotic agents status: Secondary | ICD-10-CM | POA: Insufficient documentation

## 2017-10-18 DIAGNOSIS — Z794 Long term (current) use of insulin: Secondary | ICD-10-CM | POA: Insufficient documentation

## 2017-10-18 DIAGNOSIS — M1711 Unilateral primary osteoarthritis, right knee: Secondary | ICD-10-CM | POA: Insufficient documentation

## 2017-10-18 DIAGNOSIS — M4802 Spinal stenosis, cervical region: Secondary | ICD-10-CM | POA: Insufficient documentation

## 2017-10-18 IMAGING — MR MR CERVICAL SPINE W/O CM
5 series · 33 of 48 positions shown · non-contrast
Comparison: Cervical spine CT 07/14/2016.

CLINICAL DATA: Right shoulder pain extending into the right elbow
for 6 months. Injury 6 or 7 months ago. Previous right shoulder
surgery.

EXAM:
MRI CERVICAL SPINE WITHOUT CONTRAST
TECHNIQUE: Multiplanar, multisequence MR imaging of the cervical spine was
performed. No intravenous contrast was administered.

[Series 3: T2 · sagittal · 3.0mm · 0.70mm/px · 6 of 13 slices shown (1 of 2)]
[im 1/13]
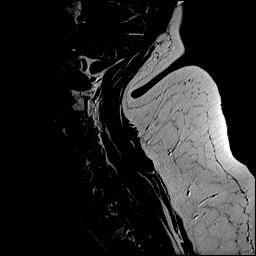
[im 3/13]
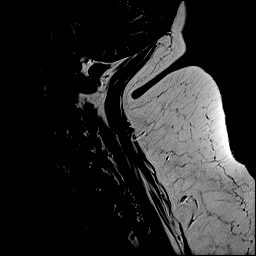
[im 5/13]
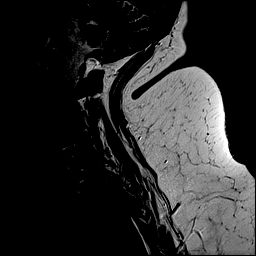
[im 8/13]
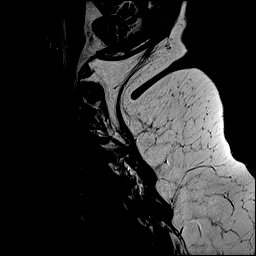
[im 10/13]
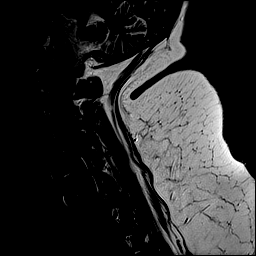
[im 13/13]
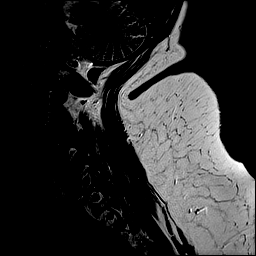

[Series 4: T1 · sagittal · 3.0mm · 0.70mm/px · 6 of 13 slices shown]
[im 1/13]
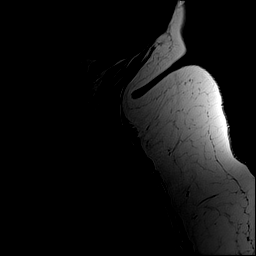
[im 3/13]
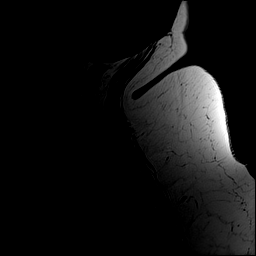
[im 5/13]
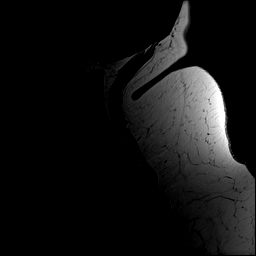
[im 8/13]
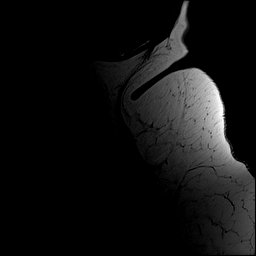
[im 10/13]
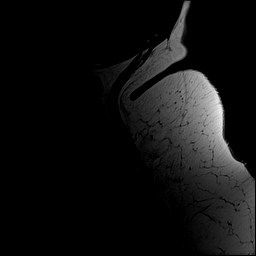
[im 13/13]
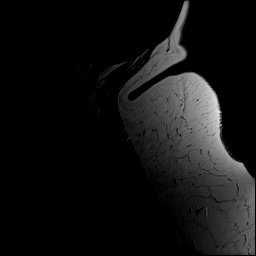

[Series 5: STIR · sagittal · 3.0mm · 0.35mm/px · 6 of 13 slices shown]
[im 1/13]
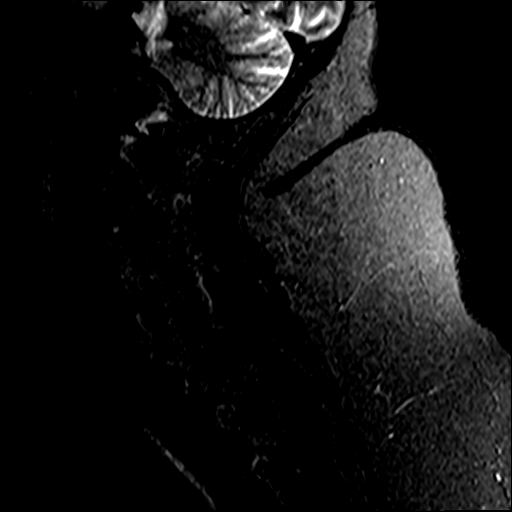
[im 3/13]
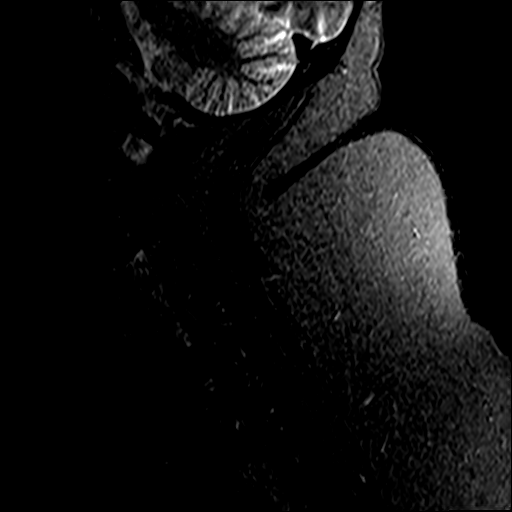
[im 5/13]
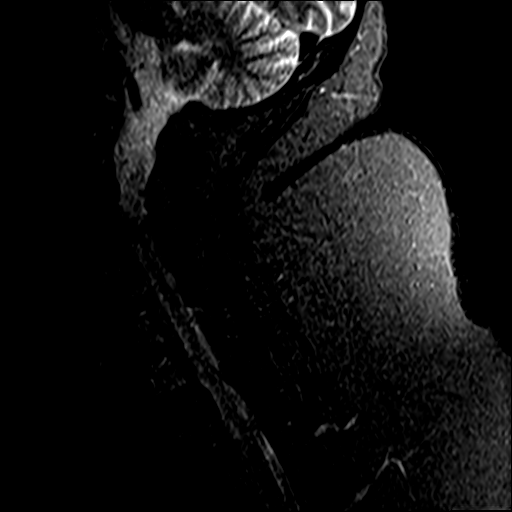
[im 8/13]
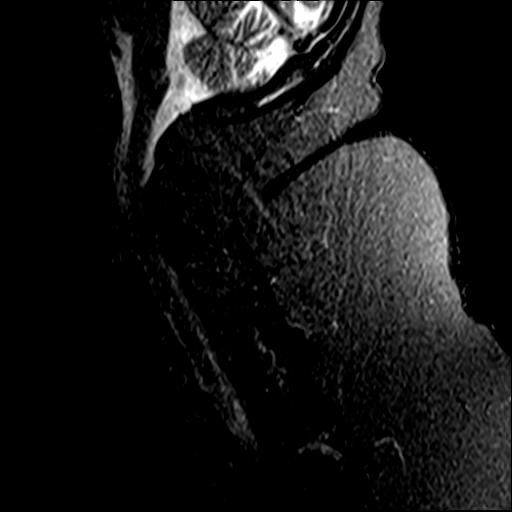
[im 10/13]
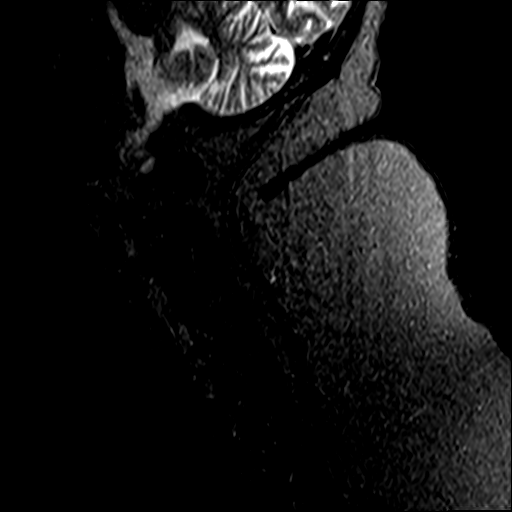
[im 13/13]
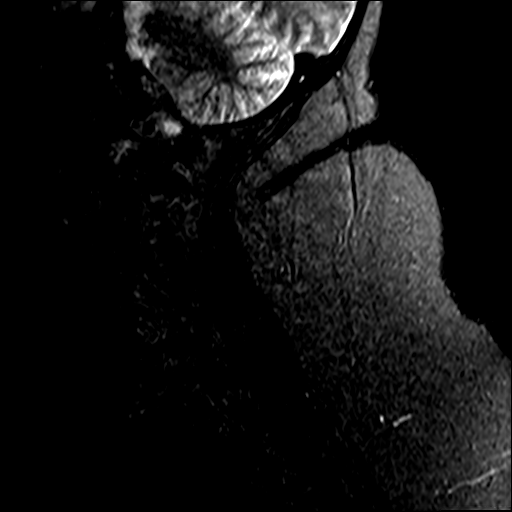

[Series 6: T2 · axial · 3.0mm · 0.70mm/px · z∈[-79,+28]mm · 9 of 30 slices shown (2 of 2)]
[im 1/30]
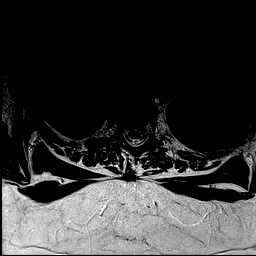
[im 5/30]
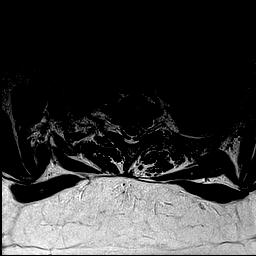
[im 9/30]
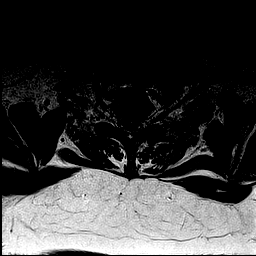
[im 13/30]
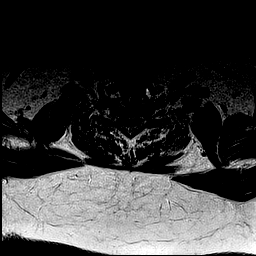
[im 15/30]
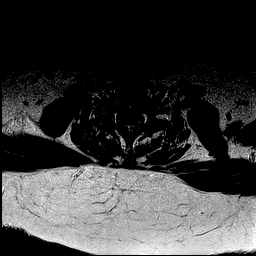
[im 17/30]
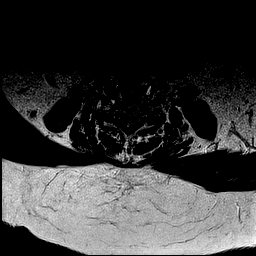
[im 21/30]
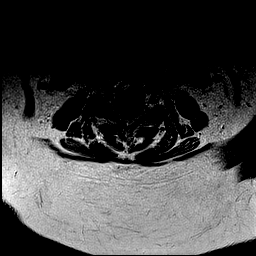
[im 25/30]
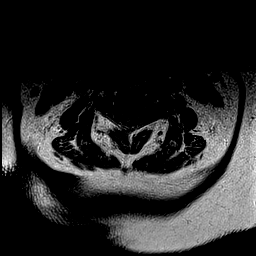
[im 30/30]
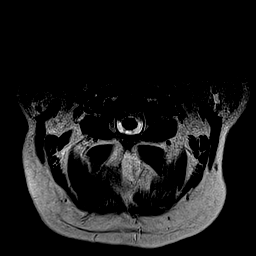

[Series 7: mpgr ax · axial · 3.0mm · 0.35mm/px · z∈[-79,-5]mm · 6 of 30 slices shown]
[im 1/30]
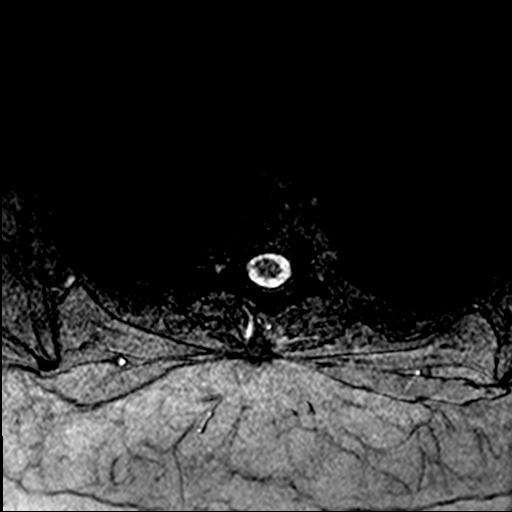
[im 5/30]
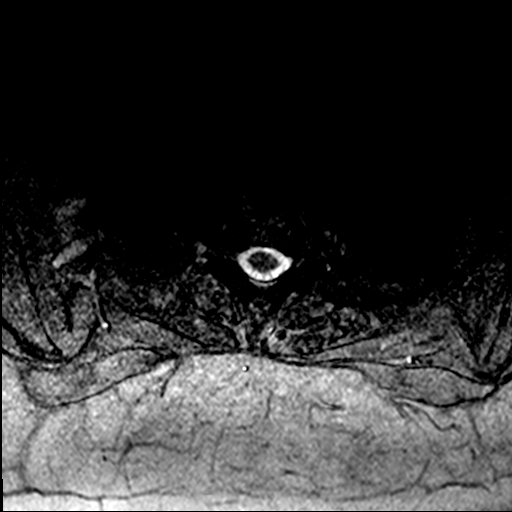
[im 9/30]
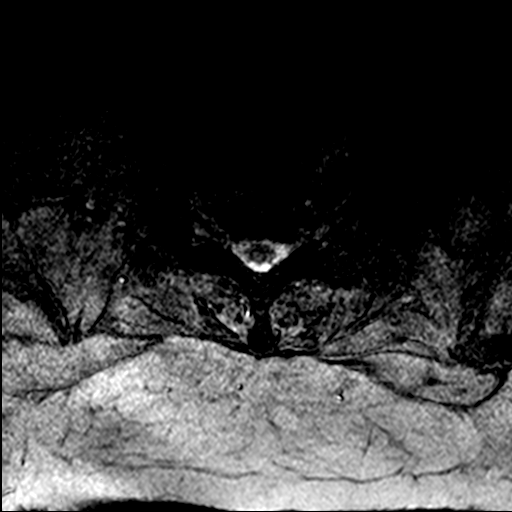
[im 13/30]
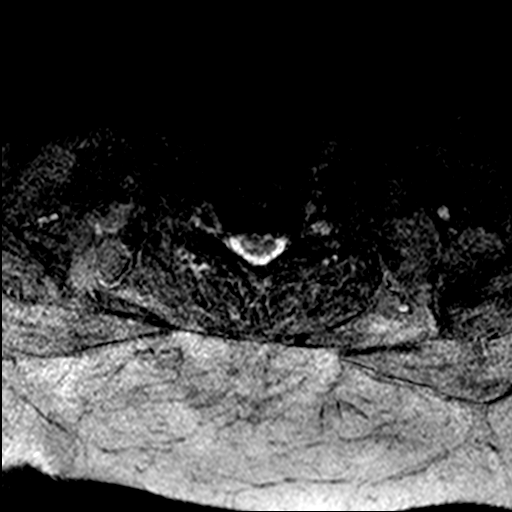
[im 17/30]
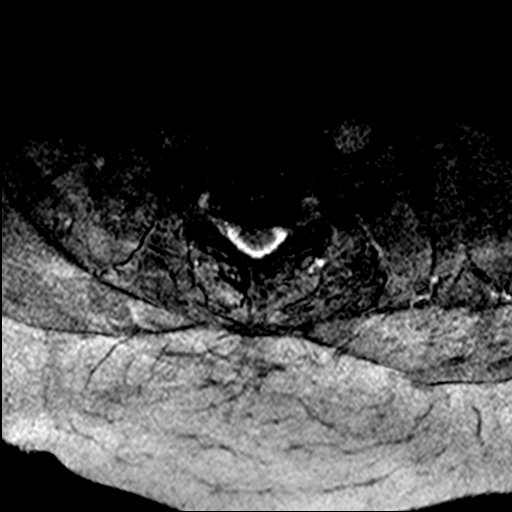
[im 21/30]
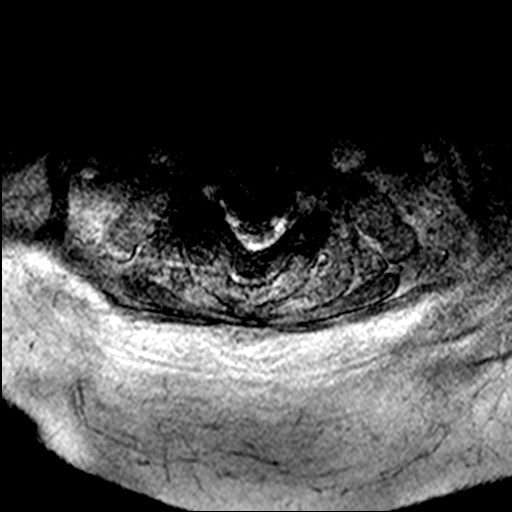

[33 of 48 positions shown; findings below may reference images not displayed]

FINDINGS: Despite efforts by the technologist and patient, mild motion
artifact is present on today's exam and could not be eliminated.
This reduces exam sensitivity and specificity.

Alignment: Straightening without focal angulation or listhesis for

Vertebrae: No acute or suspicious osseous findings. Status post C5-7
ACDF.

Cord: Normal in signal and caliber.

Posterior Fossa, vertebral arteries, paraspinal tissues: Visualized
portions of the posterior fossa and paraspinal soft tissues appear
unremarkable. Bilateral vertebral artery flow voids.

Disc levels:

C2-3: The disc appears normal. There is mild asymmetric facet
hypertrophy on the left without resulting foraminal compromise or
exiting nerve root encroachment.

C3-4: There is a shallow, broad-based central disc protrusion
effacing the CSF surrounding the cord and narrowing the AP diameter
of the canal to 9 mm. There is no cord deformity. There is
asymmetric facet hypertrophy on the left contributing to moderate
left foraminal narrowing. The right foramen appears patent.

C4-5: Stable chronic spondylosis with loss of disc height, endplate
osteophytes and asymmetric facet hypertrophy on the right. Stable
chronic minimal right foraminal narrowing. No cord deformity.

C5-6: The spinal canal and neural foramina appear well-decompressed
status post ACDF.

C6-7: The spinal canal and neural foramina appear well-decompressed
status post ACDF.

C7-T1: Mild facet hypertrophy, worse on the left. The disc appears
normal. No spinal stenosis or nerve root encroachment.
IMPRESSION: 1. No acute findings or clear explanation for the patient's
symptoms. Detail limited by motion artifact and body habitus.
2. Stable findings from previous C5-7 ACDF.
3. Stable adjacent segment disease at C4-5 with minimal right
foraminal narrowing, but no evidence of nerve root encroachment.
4. Moderate chronic foraminal narrowing on the left at C3-4
secondary to asymmetric facet hypertrophy. Associated borderline
spinal stenosis without cord deformity.

## 2017-10-18 IMAGING — MR MR SHOULDER*R* W/O CM
5 series · 40 of 40 positions shown · non-contrast
Comparison: None.

CLINICAL DATA: Pt states right shoulder pain down to right elbow x
6 months. Injury in [DATE] prior history of right shoulder
surgery.

EXAM:
MRI OF THE RIGHT SHOULDER WITHOUT CONTRAST
TECHNIQUE: Multiplanar, multisequence MR imaging of the shoulder was performed.
No intravenous contrast was administered.

[Series 4: T2 fat-sat · axial · 4.0mm · 0.47mm/px · z∈[-19,+69]mm · 8 of 21 slices shown (1 of 3)]
[im 1/21]
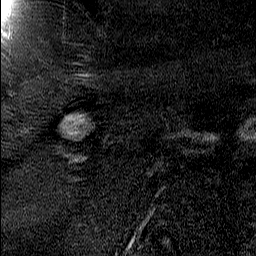
[im 3/21]
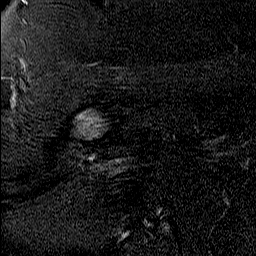
[im 6/21]
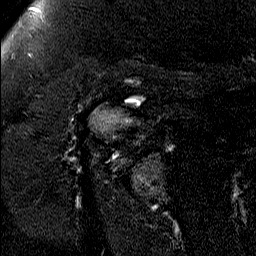
[im 9/21]
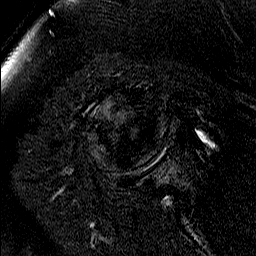
[im 12/21]
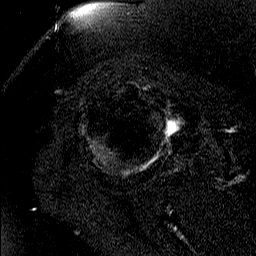
[im 15/21]
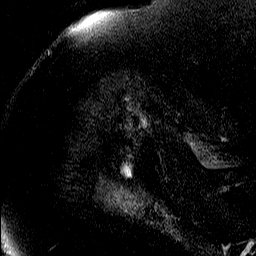
[im 18/21]
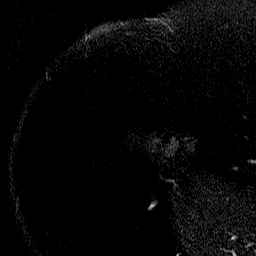
[im 21/21]
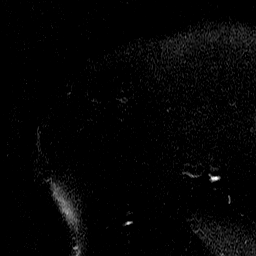

[Series 6: PD · oblique · 4.0mm · 0.62mm/px · 8 of 19 slices shown]
[im 1/19]
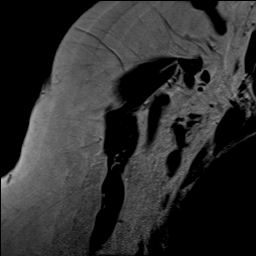
[im 3/19]
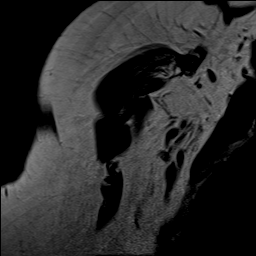
[im 6/19]
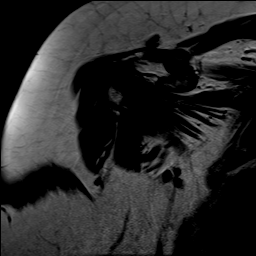
[im 8/19]
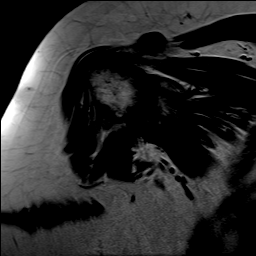
[im 11/19]
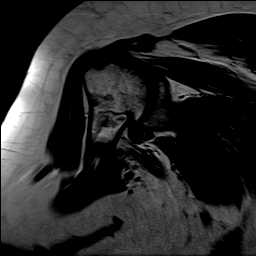
[im 13/19]
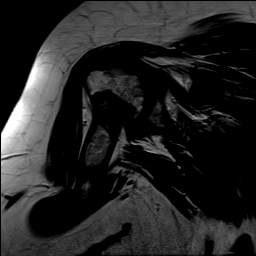
[im 16/19]
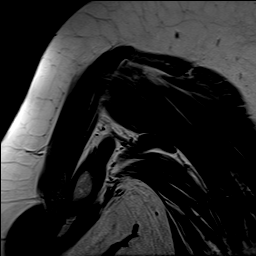
[im 19/19]
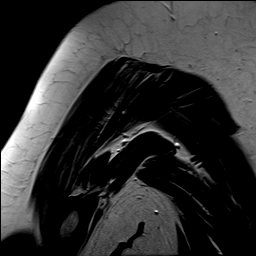

[Series 7: T1 · oblique · 4.0mm · 0.62mm/px · 8 of 21 slices shown]
[im 1/21]
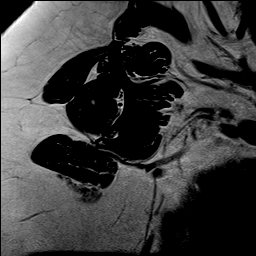
[im 3/21]
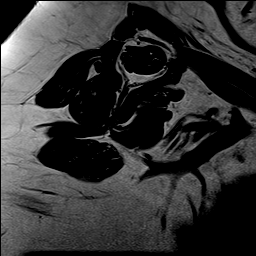
[im 6/21]
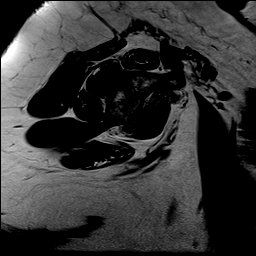
[im 9/21]
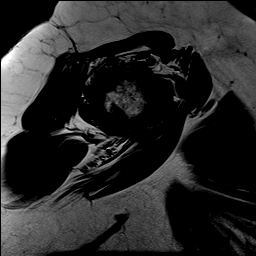
[im 12/21]
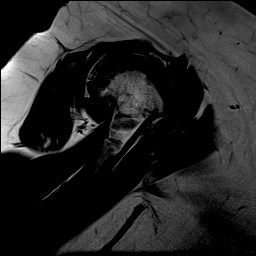
[im 15/21]
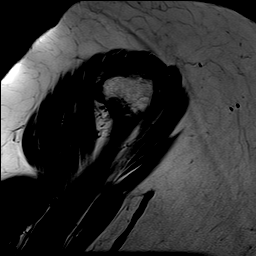
[im 18/21]
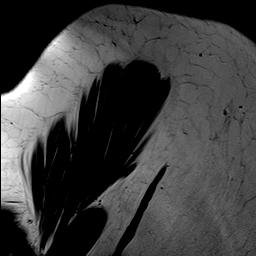
[im 21/21]
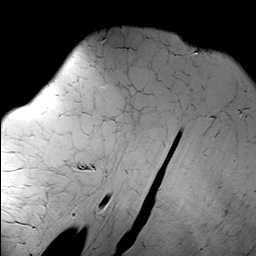

[Series 8: T2 fat-sat · oblique · 4.0mm · 0.62mm/px · 8 of 21 slices shown (2 of 3)]
[im 1/21]
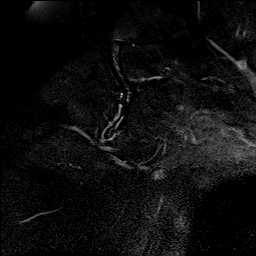
[im 3/21]
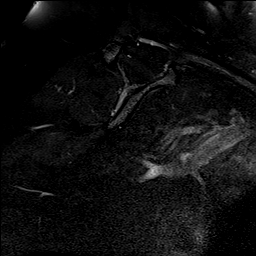
[im 6/21]
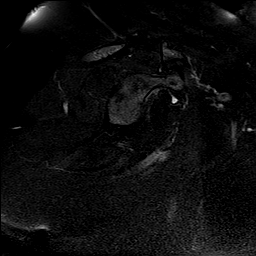
[im 9/21]
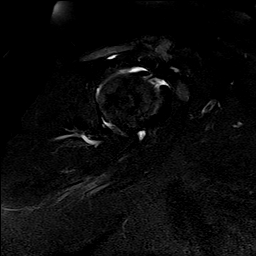
[im 12/21]
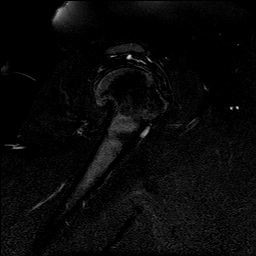
[im 15/21]
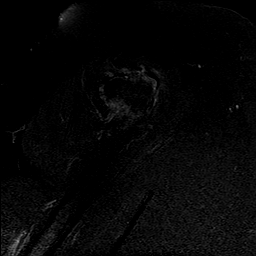
[im 18/21]
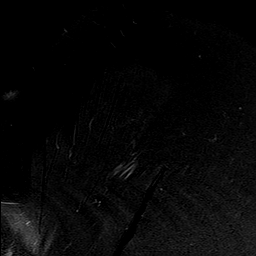
[im 21/21]
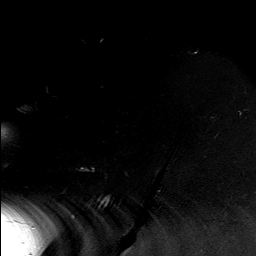

[Series 9: T2 fat-sat · oblique · 4.0mm · 0.62mm/px · 8 of 19 slices shown (3 of 3)]
[im 1/19]
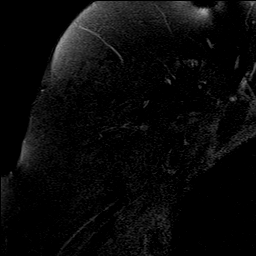
[im 3/19]
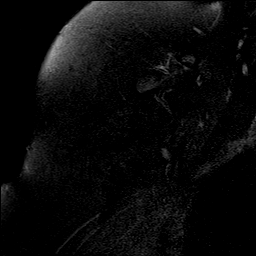
[im 6/19]
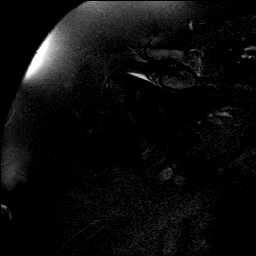
[im 8/19]
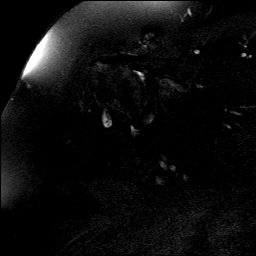
[im 11/19]
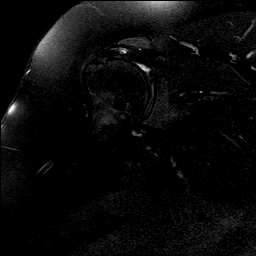
[im 13/19]
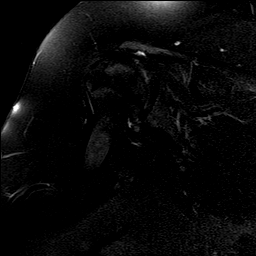
[im 16/19]
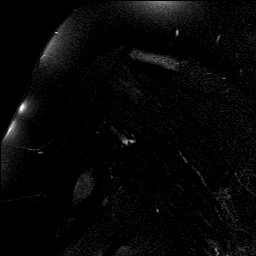
[im 19/19]
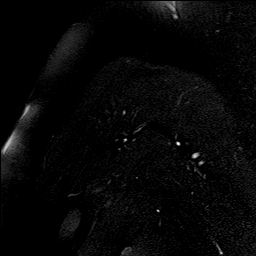

[40 of 40 positions shown; findings below may reference images not displayed]

FINDINGS: Rotator cuff: Mild tendinosis of the infraspinatus tendon. Mild
tendinosis of the supraspinatus tendon with a small insertional
interstitial tear. Teres minor tendon is intact. Subscapularis
tendon is intact.

Muscles: No atrophy or fatty replacement of nor abnormal signal
within, the muscles of the rotator cuff.

Biceps long head:  Intact.

Acromioclavicular Joint: Mild arthropathy of the acromioclavicular
joint. Type I acromion. No subacromial/subdeltoid bursal fluid.

Glenohumeral Joint: No joint effusion. Partial-thickness cartilage
loss of the glenohumeral joint with small marginal osteophyte.

Labrum: Grossly intact, but evaluation is limited by lack of
intraarticular fluid.

Bones: No marrow abnormality, fracture or dislocation. Old
posttraumatic deformity from prior healed fracture of the surgical
neck of the right proximal humerus.

Other: No fluid collection or hematoma.
IMPRESSION: 1. Mild tendinosis of the infraspinatus tendon.
2. Mild tendinosis of the supraspinatus tendon with a small
insertional interstitial tear.
3. Mild osteoarthritis of the glenohumeral joint.

## 2017-10-18 MED ORDER — ORPHENADRINE CITRATE 30 MG/ML IJ SOLN
60.0000 mg | Freq: Once | INTRAMUSCULAR | Status: AC
  Administered 2017-10-18: 60 mg via INTRAMUSCULAR
  Filled 2017-10-18: qty 2

## 2017-10-18 MED ORDER — KETOROLAC TROMETHAMINE 60 MG/2ML IM SOLN
60.0000 mg | Freq: Once | INTRAMUSCULAR | Status: AC
  Administered 2017-10-18: 60 mg via INTRAMUSCULAR
  Filled 2017-10-18: qty 2

## 2017-10-18 MED ORDER — HYDROCODONE-ACETAMINOPHEN 7.5-325 MG PO TABS: 1.0000 | ORAL_TABLET | Freq: Four times a day (QID) | ORAL | 0 refills | Status: DC | PRN

## 2017-10-18 NOTE — Progress Notes (Signed)
Patient's Name: Stephanie [name]  MRN: [id number]  Referring Provider: Sarajane Jews, MD  DOB: 11/30/1950  PCP: Sarajane Jews, MD  DOS: 10/18/2017  Note by: Vevelyn Francois NP  Service setting: Ambulatory outpatient  Specialty: Interventional Pain Management  Location: [facility] (AMB) Pain Management Facility    Patient type: Established    Primary Reason(s) for Visit: Evaluation of chronic illnesses with exacerbation, or progression (Level of risk: moderate) CC: Appointment (post procedure follow up ) and Knee Pain (right )  HPI  [name] is a 67 y.o. year old, female patient, who comes today for a follow-up evaluation. She has UNSPECIFIED VITAMIN D DEFICIENCY; HLD (hyperlipidemia); TOBACCO ABUSE; OSA (obstructive sleep apnea); RESTLESS LEGS SYNDROME; Essential hypertension; Asthma; BACK PAIN, LUMBAR, CHRONIC; INSOMNIA; UNS ADVRS EFF OTH RX MEDICINAL&BIOLOGICAL SBSTNC; BIPOLAR AFFECTIVE DISORDER, HX OF; Aortic dissection ([place]); Anxiety; Lower extremity edema; Cirrhosis of liver ([place]); Varicose veins of left lower extremity; GERD (gastroesophageal reflux disease); Dyspnea; Lung nodule; DNR (do not resuscitate); Encounter for routine gynecological examination; Poorly controlled type 2 diabetes mellitus with complication ([place]); Dizziness and giddiness; DOE (dyspnea on exertion); Liver cirrhosis secondary to NASH ([place]); Abdominal pain, chronic, epigastric; Thrombocytopenia ([place]); Neuropathy; Hyperglycemia; Hypoglycemia; Iron deficiency anemia; Chest pain; Pneumonia; Pain in limb; Swelling of limb; Postphlebitic syndrome with inflammation; Lymphedema; Encephalopathy; Bilateral carotid artery stenosis; Degenerative tear of lateral meniscus of right knee; Degenerative tear of medial meniscus of right knee; Diabetes mellitus type 1, uncontrolled, without complications ([place]); Primary osteoarthritis of right knee; Altered mental status; Elevated alkaline phosphatase level; Sepsis ([place]); Cervical stenosis of spine;  Obesity, Class III, BMI 40-49.9 (morbid obesity) ([place]); Stress fracture of femur; and Tricompartment osteoarthritis of right knee on their problem list. [name] was last seen on Visit date not found. Her primarily concern today is the Appointment (post procedure follow up ) and Knee Pain (right )  Pain Assessment: Location: Right Knee Radiating: From thight to ankle on the right leg Onset: More than a month ago Duration: Chronic pain Quality: Pins and needles, Cramping, Sharp Severity: 9 /10 (subjective, self-reported pain score)  Note: Reported level is compatible with observation. Clinically the patient looks like a 3/10 A 3/10 is viewed as "Moderate" and described as significantly interfering with activities of daily living (ADL). It becomes difficult to feed, bathe, get dressed, get on and off the toilet or to perform personal hygiene functions. Difficult to get in and out of bed or a chair without assistance. Very distracting. With effort, it can be ignored when deeply involved in activities. Discrepancy may suggest symptom exaggeration. When using our objective Pain Scale, levels between 6 and 10/10 are said to belong in an emergency room, as it progressively worsens from a 6/10, described as severely limiting, requiring emergency care not usually available at an outpatient pain management facility. At a 6/10 level, communication becomes difficult and requires great effort. Assistance to reach the emergency department may be required. Facial flushing and profuse sweating along with potentially dangerous increases in heart rate and blood pressure will be evident. Effect on ADL: Yes, affects with walking. Fall risk  Timing: Constant Modifying factors: Medications  BP: 113/63  HR: 84  Further details on both, my assessment(s), as well as the proposed treatment plan, please see below. She is in today for increased pain in her right knee. She is SP Genicular NB on 10/04/17. This was completed  with local anesthetics only secondary to diabetes and declination of any steroids.   She has not gotten  a lot of benefit from this procedure. She admits that she has never had any pain relief since her surgery by Dr Roland Rack one year ago. She is Spright knee arthroscopy with debridement, partial medial and lateral meniscectomies, and an arthroscopic lateral release for patellofemoral arthrosis. She denies any recent falls or injuries. She admits that the pain wakes her up at night and her current regimen is not effective for the night time pain. She is very concern about her knee. She is concern because the current facility where she resides will not allow her to have any steroid treatment because it causes hyperglycemia and she has to be sent to the hospital.   Post-Procedure Assessment   05/01/19Procedure: Right GNB Pre-procedure pain score:  9/10 Post-procedure pain score: 0/10         Influential Factors: BMI: 39.33 kg/m Intra-procedural challenges: None observed.         Assessment challenges: None detected.              Reported side-effects: None.        Post-procedural adverse reactions or complications: None reported         Sedation: Please see nurses note. When no sedatives are used, the analgesic levels obtained are directly associated to the effectiveness of the local anesthetics. However, when sedation is provided, the level of analgesia obtained during the initial 1 hour following the intervention, is believed to be the result of a combination of factors. These factors may include, but are not limited to: 1. The effectiveness of the local anesthetics used. 2. The effects of the analgesic(s) and/or anxiolytic(s) used. 3. The degree of discomfort experienced by the patient at the time of the procedure. 4. The patients ability and reliability in recalling and recording the events. 5. The presence and influence of possible secondary gains and/or psychosocial factors. Reported result:  Relief experienced during the 1st hour after the procedure: 20 % (Ultra-Short Term Relief)            Interpretative annotation: Clinically appropriate result. Analgesia during this period is likely to be Local Anesthetic and/or IV Sedative (Analgesic/Anxiolytic) related.          Effects of local anesthetic: The analgesic effects attained during this period are directly associated to the localized infiltration of local anesthetics and therefore cary significant diagnostic value as to the etiological location, or anatomical origin, of the pain. Expected duration of relief is directly dependent on the pharmacodynamics of the local anesthetic used. Long-acting (4-6 hours) anesthetics used.  Reported result: Relief during the next 4 to 6 hour after the procedure: 10 % (Short-Term Relief)            Interpretative annotation: Clinically appropriate result. Analgesia during this period is likely to be Local Anesthetic-related.          Long-term benefit: Defined as the period of time past the expected duration of local anesthetics (1 hour for short-acting and 4-6 hours for long-acting). With the possible exception of prolonged sympathetic blockade from the local anesthetics, benefits during this period are typically attributed to, or associated with, other factors such as analgesic sensory neuropraxia, antiinflammatory effects, or beneficial biochemical changes provided by agents other than the local anesthetics.  Reported result: Extended relief following procedure: 0 % (Long-Term Relief)            Interpretative annotation: Clinically appropriate result. Good relief. No permanent benefit expected. Inflammation plays a part in the etiology to the pain.  Current benefits: Defined as reported results that persistent at this point in time.   Analgesia: <25 %            Function: Back to baseline ROM: Back to baseline Interpretative annotation: Recurrence of symptoms. No permanent benefit expected.  No steroids injected.          Interpretation: Results would suggest                     Plan:  Please see "Plan of Care" for details.                Laboratory Chemistry  Inflammation Markers (CRP: Acute Phase) (ESR: Chronic Phase) Lab Results  Component Value Date   ESRSEDRATE 71 (H) 04/20/2016   LATICACIDVEN 1.1 07/20/2017                         Rheumatology Markers Lab Results  Component Value Date   ANA POS (A) 03/19/2012                        Renal Function Markers Lab Results  Component Value Date   BUN 10 08/15/2017   CREATININE 0.74 08/15/2017   GFRAA >60 08/15/2017   GFRNONAA >60 08/15/2017                              Hepatic Function Markers Lab Results  Component Value Date   AST 25 07/19/2017   ALT 19 07/19/2017   ALBUMIN 3.2 (L) 07/19/2017   ALKPHOS 143 (H) 07/19/2017   HCVAB NEGATIVE 02/03/2012   AMYLASE 30 06/12/2014   LIPASE 17 12/09/2015   AMMONIA 45 (H) 07/21/2017                        Electrolytes Lab Results  Component Value Date   NA 141 08/15/2017   K 2.8 (L) 08/15/2017   CL 103 08/15/2017   CALCIUM 9.0 08/15/2017   MG 2.1 07/21/2017   PHOS 3.7 07/21/2017                        Neuropathy Markers Lab Results  Component Value Date   VITAMINB12 431 11/13/2015   FOLATE 29.0 11/13/2015   HGBA1C 5.2 07/20/2016                        Bone Pathology Markers Lab Results  Component Value Date   VD25OH 30 11/12/2012   VD125OH2TOT 10 (L) 06/19/2008                         Coagulation Parameters Lab Results  Component Value Date   INR 1.00 03/07/2017   LABPROT 13.1 03/07/2017   APTT 37 (H) 03/07/2017   PLT 135 (L) 08/15/2017                        Cardiovascular Markers Lab Results  Component Value Date   BNP 108.0 (H) 07/28/2016   CKTOTAL 119 06/30/2012   CKMB 0.6 06/30/2012   TROPONINI <0.03 07/19/2017   HGB 12.4 08/15/2017   HCT 36.3 08/15/2017                         CA Markers No results  found for: CEA,  CA125, LABCA2                      Note: Lab results reviewed.  Recent Diagnostic Imaging Review    Knee Imaging: Knee-R MR w contrast: No results found for this or any previous visit. Knee-L MR w contrast:  Results for orders placed during the hospital encounter of 03/07/17  MR KNEE LEFT WO CONTRAST   Narrative CLINICAL DATA:  Left medial knee pain since a twisting injury.  EXAM: MRI OF THE LEFT KNEE WITHOUT CONTRAST  TECHNIQUE: Multiplanar, multisequence MR imaging of the knee was performed. No intravenous contrast was administered.  COMPARISON:  Left knee x-rays dated February 15, 2017.  FINDINGS: MENISCI  Medial meniscus: There is a horizontal tear of the body and posterior horn.  Lateral meniscus: There is a complex tear of the body with vertical and horizontal components.  LIGAMENTS  Cruciates:  Intact ACL and PCL.  Collaterals: Medial collateral ligament is intact. Lateral collateral ligament complex is intact.  CARTILAGE  Patellofemoral: Mild superficial irregularity over the patellar apex.  Medial:  No chondral defect.  Lateral:  No chondral defect.  Joint: Trace joint effusion. Normal Hoffa's fat. No plical thickening.  Popliteal Fossa:  No Baker cyst.  Intact popliteus tendon.  Extensor Mechanism:  Intact quadriceps tendon and patellar tendon.  Bones: There is a nondisplaced fracture through the medial distal femoral metaphysis with surrounding marrow and periosteal edema.  Other: None.  IMPRESSION: 1. Nondisplaced fracture through the medial aspect of the distal femoral metaphysis. 2. Horizontal tear of the medial meniscus body and posterior horn. 3. Complex tear of the lateral meniscus body with vertical and horizontal components.   Electronically Signed   By: Titus Dubin M.D.   On: 03/08/2017 21:57     Results for orders placed during the hospital encounter of 05/23/16  MR KNEE RIGHT WO CONTRAST   Narrative CLINICAL DATA:   Right knee pain and swelling for 4-5 months. No known injury.  EXAM: MRI OF THE RIGHT KNEE WITHOUT CONTRAST  TECHNIQUE: Multiplanar, multisequence MR imaging of the knee was performed. No intravenous contrast was administered.  COMPARISON:  Plain films right knee 08/06/2012 and 03/09/2016.  FINDINGS: MENISCI  Medial meniscus: Degenerative signal is seen in the posterior horn. No tear.  Lateral meniscus:  Intact.  LIGAMENTS  Cruciates:  Intact.  Collaterals:  Intact.  CARTILAGE  Patellofemoral: Extensive cartilage loss is present along the lateral femoral trochlea and lateral patellar facet with associated marrow edema. Small subchondral cysts in the lateral patellar facet also noted.  Medial:  Minimally degenerated.  Lateral:  Negative.  Joint: Small to moderate joint effusion is identified. Loose hyaline cartilage fragment along the medial aspect of the intercondylar notch of the femur measures 0.9 cm in diameter. A 0.5 cm in diameter loose cartilage fragment is seen along the medial femoral condyle.  Popliteal Fossa:  No Baker's cyst.  Extensor Mechanism:  Intact.  Bones: No fracture or worrisome lesion. Small lateral compartment osteophytes noted.  Other: None.  IMPRESSION: Negative for meniscal or ligament tear.  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  Two loose cartilage fragments are identified in the joint. One is along the medial femoral condyle and a second is seen along the medial side of the intercondylar notch of the femur.   Electronically Signed   By: Inge Rise M.D.   On: 05/23/2016 14:33  Knee-R DG 1-2 views:  Results for orders placed during the hospital encounter of 05/31/16  DG Knee 2 Views Right   Narrative CLINICAL DATA:  Status post fall onto right side, with diffuse right knee pain. Initial encounter.  EXAM: RIGHT KNEE -  1-2 VIEW  COMPARISON:  Right knee radiographs performed 03/09/2016, and MRI of the right knee performed 05/23/2016  FINDINGS: There is no evidence of fracture or dislocation. The joint spaces are preserved. Mild marginal osteophyte formation is noted at the lateral compartment. Mild cortical irregularity is seen along the articular surface of the patella.  A small knee joint effusion is seen. A fabella is noted. The visualized soft tissues are normal in appearance.  IMPRESSION: 1. No evidence of fracture or dislocation. 2. Mild degenerative change at the lateral and patellofemoral compartments. 3. Small knee joint effusion noted.   Electronically Signed   By: Garald Balding M.D.   On: 05/31/2016 17:31    Knee-L DG 1-2 views:  Results for orders placed during the hospital encounter of 02/15/17  DG Knee 2 Views Left   Narrative CLINICAL DATA:  Left knee pain.  No known injury.  EXAM: LEFT KNEE - 1-2 VIEW  COMPARISON:  01/31/2016  FINDINGS: No evidence of fracture, dislocation, or joint effusion. No evidence of arthropathy or other focal bone abnormality. Soft tissues are unremarkable.  IMPRESSION: Negative.   Electronically Signed   By: Rolm Baptise M.D.   On: 02/15/2017 19:42    Knee-R DG 4 views:  Results for orders placed during the hospital encounter of 09/13/17  DG Knee Complete 4 Views Right   Narrative CLINICAL DATA:  Right knee pain after fall.  EXAM: RIGHT KNEE - COMPLETE 4+ VIEW  COMPARISON:  Right knee x-rays dated December 09, 2016.  FINDINGS: No acute fracture or dislocation. No joint effusion. Mild tricompartmental osteoarthritis, similar to prior study. Chondrocalcinosis of the menisci. Osteopenia. Soft tissues are unremarkable.  IMPRESSION: 1.  No acute osseous abnormality.   Electronically Signed   By: Titus Dubin M.D.   On: 09/13/2017 10:50      Complexity Note: Imaging results reviewed. Results shared with Ms. Seehafer, using  [employer].                         Meds   Current Outpatient Medications:  .  budesonide-formoterol (SYMBICORT) 160-4.5 MCG/ACT inhaler, Inhale 2 puffs into the lungs 2 (two) times daily. (0800 & 2000), Disp: , Rfl:  .  budesonide-formoterol (SYMBICORT) 160-4.5 MCG/ACT inhaler, Inhale into the lungs., Disp: , Rfl:  .  buPROPion (WELLBUTRIN XL) 150 MG 24 hr tablet, Take 1 tablet (150 mg total) by mouth daily., Disp: 90 tablet, Rfl: 1 .  clonazepam (KLONOPIN) 0.125 MG disintegrating tablet, Take 0.25 mg by mouth 3 (three) times daily. , Disp: , Rfl:  .  dicyclomine (BENTYL) 10 MG capsule, Take 10 mg by mouth 4 (four) times daily -  before meals and at bedtime., Disp: , Rfl:  .  ferrous sulfate 325 (65 FE) MG tablet, Take 325 mg by mouth daily with breakfast., Disp: , Rfl:  .  furosemide (LASIX) 80 MG tablet, Take 80 mg by mouth., Disp: , Rfl:  .  gabapentin (NEURONTIN) 600 MG tablet, Take 600 mg by mouth 2 (two) times daily., Disp: , Rfl:  .  insulin glargine (LANTUS) 100 UNIT/ML injection, Inject 10-33 Units into the skin at bedtime., Disp: , Rfl:  .  insulin lispro (HUMALOG)  100 UNIT/ML injection, Inject into the skin 3 (three) times daily before meals., Disp: , Rfl:  .  lactulose (CHRONULAC) 10 GM/15ML solution, Take 15 g by mouth daily. (0800), Disp: , Rfl:  .  lamoTRIgine (LAMICTAL) 100 MG tablet, Take 1 tablet (100 mg total) by mouth 2 (two) times daily. (0800 & 2000), Disp: 180 tablet, Rfl: 1 .  lamoTRIgine (LAMICTAL) 25 MG tablet, Take 1 tablet (25 mg total) by mouth daily. To be added to the Lamictal 100 mg in the AM, Disp: 90 tablet, Rfl: 1 .  lidocaine (LIDODERM) 5 %, Place 2 patches onto the skin daily. Remove & Discard patch within 12 hours or as directed by MD, Disp: , Rfl:  .  liraglutide (VICTOZA) 18 MG/3ML SOPN, Inject 1.8 mg into the skin daily., Disp: , Rfl:  .  LYRICA 150 MG capsule, Take 150 mg by mouth 3 (three) times daily. (0800, 1400 & 2000), Disp: , Rfl:  .   metFORMIN (GLUCOPHAGE-XR) 500 MG 24 hr tablet, Take 1 tablet by mouth each morning for 7 days, then increase to one tablet in morning and evening, Disp: , Rfl:  .  metoCLOPramide (REGLAN) 5 MG tablet, Take 5 mg by mouth 4 (four) times daily -  before meals and at bedtime. (0800, 1200, 1600 & 2000), Disp: , Rfl:  .  mirtazapine (REMERON) 30 MG tablet, Take 1.5 tablets (45 mg total) by mouth at bedtime., Disp: 135 tablet, Rfl: 1 .  nystatin (MYCOSTATIN/NYSTOP) powder, Apply topically 4 (four) times daily., Disp: 15 g, Rfl: 0 .  omeprazole (PRILOSEC) 20 MG capsule, Take 20 mg by mouth daily. , Disp: , Rfl:  .  oxybutynin (DITROPAN-XL) 5 MG 24 hr tablet, Take 5 mg by mouth daily. (0800), Disp: , Rfl:  .  rOPINIRole (REQUIP) 3 MG tablet, Take 3 mg by mouth at bedtime. (2000), Disp: , Rfl:  .  simvastatin (ZOCOR) 10 MG tablet, Take 10 mg by mouth at bedtime. (2000), Disp: , Rfl:  .  spironolactone (ALDACTONE) 50 MG tablet, Take 50 mg by mouth daily. (0800), Disp: , Rfl:  .  vitamin C (ASCORBIC ACID) 500 MG tablet, Take 500 mg by mouth 2 (two) times daily. (0800 & 2000), Disp: , Rfl:  .  zolpidem (AMBIEN) 5 MG tablet, Take 5 mg by mouth at bedtime. (2000), Disp: , Rfl:  .  HYDROcodone-acetaminophen (NORCO) 7.5-325 MG tablet, Take 1 tablet by mouth every 6 (six) hours as needed for moderate pain., Disp: 120 tablet, Rfl: 0 .  potassium chloride (K-DUR,KLOR-CON) 10 MEQ tablet, Take 1 tablet (10 mEq total) by mouth daily for 4 days., Disp: 4 tablet, Rfl: 0  ROS  Constitutional: Denies any fever or chills Gastrointestinal: No reported hemesis, hematochezia, vomiting, or acute GI distress Musculoskeletal: Denies any acute onset joint swelling, redness, loss of ROM, or weakness Neurological: No reported episodes of acute onset apraxia, aphasia, dysarthria, agnosia, amnesia, paralysis, loss of coordination, or loss of consciousness  Allergies  Ms. Nesmith is allergic to doxycycline.  [place]  Drug: Ms. Domangue   reports that she does not use drugs. Alcohol:  reports that she does not drink alcohol. Tobacco:  reports that she quit smoking about 4 years ago. Her smoking use included cigarettes. She has a 75.00 pack-year smoking history. She has never used smokeless tobacco. Medical:  has a past medical history of Adenomatous colon polyp (08/27/2014), Anemia, Anxiety, Aortic dissection ([place]), Arthritis, Asthma, Bipolar affective disorder ([place]), Blood transfusion (2000), Blood transfusion without reported  diagnosis, Cancer ([place]), Chronic abdominal pain, Cirrhosis ([place]), Colon polyp, Depression, Diabetes mellitus without complication ([place]), Fatty liver (04/09/08), GERD (gastroesophageal reflux disease), H/O: CVA (cardiovascular accident), H/O: rheumatic fever, Hepatitis, Hyperlipidemia, Incontinence, Insomnia, Lower extremity edema, Obstructive sleep apnea, Platelets decreased ([place]), RLS (restless legs syndrome), Shortness of breath, Sleep apnea, Stroke ([place]), Ulcer, and Unspecified disorders of nervous system. Surgical: Ms. Clinkenbeard  has a past surgical history that includes Cholecystectomy; Rotator cuff repair; Abdominal hysterectomy; Lumbar fusion (10/09); Hemorroidectomy; Abdominal exploration surgery; Posterior cervical fusion/foraminotomy; Lumbar wound debridement (05/09/2011); Axillary artery cannulation via 8-mm Hemashield graft, median sternotomy, extracorporeal circulation with deep hypothermic circulatory arrest, repair of  aortic dessection (01/23/2009); cataract; Colonoscopy; Liver biopsy (04/10/2012); Back surgery; Eye surgery (Bilateral); and Knee arthroscopy with lateral release (Right, 11/15/2016). Family: family history includes Anxiety disorder in her brother; Breast cancer in her mother; Depression in her brother; Diabetes in her unknown relative; Lung cancer in her mother; Stomach cancer in her father.  Constitutional Exam  General appearance: Well nourished, well developed, and well hydrated. In no  apparent acute distress Vitals:   10/18/17 0916  BP: 113/63  Pulse: 84  Resp: 18  Temp: 98 F (36.7 C)  SpO2: 100%  Weight: 222 lb (100.7 kg)  Height: _0  (1.6 m)  Psych/Mental status: Alert, oriented x 3 (person, place, & time)       Eyes: PERLA Respiratory: No evidence of acute respiratory distress  Gait & Posture Assessment  Ambulation: Patient ambulates using a wheel chair Gait: Relatively normal for age and body habitus Posture: WNL   Lower Extremity Exam    Side: Right lower extremity  Side: Left lower extremity  Stability: No instability observed          Stability: No instability observed          Skin & Extremity Inspection: Pitting edema 1+ bruising to injection site  Skin & Extremity Inspection: Pitting edema 1+  Sensory (Neurological): Unimpaired  Sensory (Neurological): Unimpaired  Palpation: Complains of area being tender to palpation  Palpation: Non-tender   Assessment  Primary Diagnosis & Pertinent Problem List: The primary encounter diagnosis was Tricompartment osteoarthritis of right knee. Diagnoses of Chronic pain of right knee and Chronic pain syndrome were also pertinent to this visit.  Status Diagnosis  Worsening Worsening Persistent 1. Tricompartment osteoarthritis of right knee   2. Chronic pain of right knee   3. Chronic pain syndrome     Problems updated and reviewed during this visit: Problem  Tricompartment Osteoarthritis of Right Knee  Cervical Stenosis of Spine  Primary Osteoarthritis of Right Knee  BACK PAIN, LUMBAR, CHRONIC   Qualifier: Diagnosis of  By: Deborra Medina MD, [place]      Plan of Care  Pharmacotherapy (Medications Ordered): Meds ordered this encounter  Medications  . HYDROcodone-acetaminophen (NORCO) 7.5-325 MG tablet    Sig: Take 1 tablet by mouth every 6 (six) hours as needed for moderate pain.    Dispense:  120 tablet    Refill:  0    Do not place this medication, or any other prescription from our practice, on "Automatic  Refill". Patient may have prescription filled one day early if pharmacy is closed on scheduled refill date. Do not fill until:  To last until:    Order Specific Question:   Supervising Provider    Answer:   Milinda Pointer [phone]  . orphenadrine (NORFLEX) injection 60 mg  . ketorolac (TORADOL) injection 60 mg   New Prescriptions   HYDROCODONE-ACETAMINOPHEN (NORCO) 7.5-325  MG TABLET    Take 1 tablet by mouth every 6 (six) hours as needed for moderate pain.   Medications administered today: We administered orphenadrine and ketorolac. Lab-work, procedure(s), and/or referral(s): Orders Placed This Encounter  Procedures  . MR KNEE RIGHT WO CONTRAST   Imaging and/or referral(s): MR KNEE RIGHT WO CONTRAST  Interventional therapies: Planned, scheduled, and/or pending:   Not at this time.    Provider-requested follow-up: Return for Appointment As Scheduled.  Future Appointments  Date Time Provider [place]  11/06/2017 10:45 AM Lequita Asal, MD [facility] None  11/06/2017  3:00 PM Lubertha South, LCSW [facility] None  11/16/2017 12:00 PM Gillis Santa, MD [facility] None  11/23/2017  1:00 PM Ursula Alert, MD [facility] None  07/03/2018 10:15 AM Dew, Erskine Squibb, MD [facility] None   Primary Care Physician: Sarajane Jews, MD Location: [facility] Note by: Vevelyn Francois NP Date: 10/18/2017; Time: 3:03 PM  Pain Score Disclaimer: We use the NRS-11 scale. This is a self-reported, subjective measurement of pain severity with only modest accuracy. It is used primarily to identify changes within a particular patient. It must be understood that outpatient pain scales are significantly less accurate that those used for research, where they can be applied under ideal controlled circumstances with minimal exposure to variables. In reality, the score is likely to be a combination of pain intensity and pain affect, where pain affect describes the degree of  emotional arousal or changes in action readiness caused by the sensory experience of pain. Factors such as social and work situation, setting, emotional state, anxiety levels, expectation, and prior pain experience may influence pain perception and show large inter-individual differences that may also be affected by time variables.  Patient instructions provided during this appointment: Patient Instructions  _________________________________________________________________________________You have been given one prescription for Hydrocodone. ___________  Medication Rules  Applies to: All patients receiving prescriptions (written or electronic).  Pharmacy of record: Pharmacy where electronic prescriptions will be sent. If written prescriptions are taken to a different pharmacy, please inform the nursing staff. The pharmacy listed in the electronic medical record should be the one where you would like electronic prescriptions to be sent.  Prescription refills: Only during scheduled appointments. Applies to both, written and electronic prescriptions.  NOTE: The following applies primarily to controlled substances (Opioid* Pain Medications).   Patient's responsibilities: 1. Pain Pills: Bring all pain pills to every appointment (except for procedure appointments). 2. Pill Bottles: Bring pills in original pharmacy bottle. Always bring newest bottle. Bring bottle, even if empty. 3. Medication refills: You are responsible for knowing and keeping track of what medications you need refilled. The day before your appointment, write a list of all prescriptions that need to be refilled. Bring that list to your appointment and give it to the admitting nurse. Prescriptions will be written only during appointments. If you forget a medication, it will not be "Called in", "Faxed", or "electronically sent". You will need to get another appointment to get these prescribed. 4. Prescription Accuracy: You are responsible  for carefully inspecting your prescriptions before leaving our office. Have the discharge nurse carefully go over each prescription with you, before taking them home. Make sure that your name is accurately spelled, that your address is correct. Check the name and dose of your medication to make sure it is accurate. Check the number of pills, and the written instructions to make sure they are clear and accurate. Make sure that you are given enough  medication to last until your next medication refill appointment. 5. Taking Medication: Take medication as prescribed. Never take more pills than instructed. Never take medication more frequently than prescribed. Taking less pills or less frequently is permitted and encouraged, when it comes to controlled substances (written prescriptions).  6. Inform other Doctors: Always inform, all of your healthcare providers, of all the medications you take. 7. Pain Medication from other Providers: You are not allowed to accept any additional pain medication from any other Doctor or Healthcare provider. There are two exceptions to this rule. (see below) In the event that you require additional pain medication, you are responsible for notifying us, as stated below. 8. Medication Agreement: You are responsible for carefully reading and following our Medication Agreement. This must be signed before receiving any prescriptions from our practice. Safely store a copy of your signed Agreement. Violations to the Agreement will result in no further prescriptions. (Additional copies of our Medication Agreement are available upon request.) 9. Laws, Rules, & Regulations: All patients are expected to follow all Federal and [employer], [employer], Rules, [employer]. Ignorance of the Laws does not constitute a valid excuse. The use of any illegal substances is prohibited. 10. Adopted CDC guidelines & recommendations: Target dosing levels will be at or below 60 MME/day. Use of benzodiazepines**  is not recommended.  Exceptions: There are only two exceptions to the rule of not receiving pain medications from other Healthcare Providers. 1. Exception #1 (Emergencies): In the event of an emergency (i.e.: accident requiring emergency care), you are allowed to receive additional pain medication. However, you are responsible for: As soon as you are able, call our office [phone], at any time of the day or night, and leave a message stating your name, the date and nature of the emergency, and the name and dose of the medication prescribed. In the event that your call is answered by a member of our staff, make sure to document and save the date, time, and the name of the person that took your information.  2. Exception #2 (Planned Surgery): In the event that you are scheduled by another doctor or dentist to have any type of surgery or procedure, you are allowed (for a period no longer than 30 days), to receive additional pain medication, for the acute post-op pain. However, in this case, you are responsible for picking up a copy of our "Post-op Pain Management for Surgeons" handout, and giving it to your surgeon or dentist. This document is available at our office, and does not require an appointment to obtain it. Simply go to our office during business hours (Monday-Thursday from 8:00 AM to 4:00 PM) (Friday 8:00 AM to 12:00 Noon) or if you have a scheduled appointment with [name], prior to your surgery, and ask for it by name. In addition, you will need to provide [name] with your name, name of your surgeon, type of surgery, and date of procedure or surgery.  *Opioid medications include: morphine, codeine, oxycodone, oxymorphone, hydrocodone, hydromorphone, meperidine, tramadol, tapentadol, buprenorphine, fentanyl, methadone. **Benzodiazepine medications include: diazepam (Valium), alprazolam (Xanax), clonazepam (Klonopine), lorazepam (Ativan), clorazepate (Tranxene), chlordiazepoxide (Librium), estazolam  (Prosom), oxazepam (Serax), temazepam (Restoril), triazolam (Halcion) (Last updated: 08/03/2017) ____________________________________________________________________________________________

## 2017-10-18 NOTE — Patient Instructions (Addendum)
_________________________________________________________________________________You have been given one prescription for Hydrocodone. ___________  Medication Rules  Applies to: All patients receiving prescriptions (written or electronic).  Pharmacy of record: Pharmacy where electronic prescriptions will be sent. If written prescriptions are taken to a different pharmacy, please inform the nursing staff. The pharmacy listed in the electronic medical record should be the one where you would like electronic prescriptions to be sent.  Prescription refills: Only during scheduled appointments. Applies to both, written and electronic prescriptions.  NOTE: The following applies primarily to controlled substances (Opioid* Pain Medications).   Patient's responsibilities: 1. Pain Pills: Bring all pain pills to every appointment (except for procedure appointments). 2. Pill Bottles: Bring pills in original pharmacy bottle. Always bring newest bottle. Bring bottle, even if empty. 3. Medication refills: You are responsible for knowing and keeping track of what medications you need refilled. The day before your appointment, write a list of all prescriptions that need to be refilled. Bring that list to your appointment and give it to the admitting nurse. Prescriptions will be written only during appointments. If you forget a medication, it will not be "Called in", "Faxed", or "electronically sent". You will need to get another appointment to get these prescribed. 4. Prescription Accuracy: You are responsible for carefully inspecting your prescriptions before leaving our office. Have the discharge nurse carefully go over each prescription with you, before taking them home. Make sure that your name is accurately spelled, that your address is correct. Check the name and dose of your medication to make sure it is accurate. Check the number of pills, and the written instructions to make sure they are clear and accurate.  Make sure that you are given enough medication to last until your next medication refill appointment. 5. Taking Medication: Take medication as prescribed. Never take more pills than instructed. Never take medication more frequently than prescribed. Taking less pills or less frequently is permitted and encouraged, when it comes to controlled substances (written prescriptions).  6. Inform other Doctors: Always inform, all of your healthcare providers, of all the medications you take. 7. Pain Medication from other Providers: You are not allowed to accept any additional pain medication from any other Doctor or Healthcare provider. There are two exceptions to this rule. (see below) In the event that you require additional pain medication, you are responsible for notifying us, as stated below. 8. Medication Agreement: You are responsible for carefully reading and following our Medication Agreement. This must be signed before receiving any prescriptions from our practice. Safely store a copy of your signed Agreement. Violations to the Agreement will result in no further prescriptions. (Additional copies of our Medication Agreement are available upon request.) 9. Laws, Rules, & Regulations: All patients are expected to follow all Federal and [employer], [employer], Rules, [employer]. Ignorance of the Laws does not constitute a valid excuse. The use of any illegal substances is prohibited. 10. Adopted CDC guidelines & recommendations: Target dosing levels will be at or below 60 MME/day. Use of benzodiazepines** is not recommended.  Exceptions: There are only two exceptions to the rule of not receiving pain medications from other Healthcare Providers. 1. Exception #1 (Emergencies): In the event of an emergency (i.e.: accident requiring emergency care), you are allowed to receive additional pain medication. However, you are responsible for: As soon as you are able, call our office [phone], at any time of the  day or night, and leave a message stating your name, the date and nature of the emergency,  and the name and dose of the medication prescribed. In the event that your call is answered by a member of our staff, make sure to document and save the date, time, and the name of the person that took your information.  2. Exception #2 (Planned Surgery): In the event that you are scheduled by another doctor or dentist to have any type of surgery or procedure, you are allowed (for a period no longer than 30 days), to receive additional pain medication, for the acute post-op pain. However, in this case, you are responsible for picking up a copy of our "Post-op Pain Management for Surgeons" handout, and giving it to your surgeon or dentist. This document is available at our office, and does not require an appointment to obtain it. Simply go to our office during business hours (Monday-Thursday from 8:00 AM to 4:00 PM) (Friday 8:00 AM to 12:00 Noon) or if you have a scheduled appointment with [name], prior to your surgery, and ask for it by name. In addition, you will need to provide [name] with your name, name of your surgeon, type of surgery, and date of procedure or surgery.  *Opioid medications include: morphine, codeine, oxycodone, oxymorphone, hydrocodone, hydromorphone, meperidine, tramadol, tapentadol, buprenorphine, fentanyl, methadone. **Benzodiazepine medications include: diazepam (Valium), alprazolam (Xanax), clonazepam (Klonopine), lorazepam (Ativan), clorazepate (Tranxene), chlordiazepoxide (Librium), estazolam (Prosom), oxazepam (Serax), temazepam (Restoril), triazolam (Halcion) (Last updated: 08/03/2017) ____________________________________________________________________________________________

## 2017-10-18 NOTE — Progress Notes (Signed)
Safety precautions to be maintained throughout the outpatient stay will include: orient to surroundings, keep bed in low position, maintain call bell within reach at all times, provide assistance with transfer out of bed and ambulation.  

## 2017-10-26 ENCOUNTER — Encounter: Payer: Self-pay | Admitting: Hematology and Oncology

## 2017-10-26 IMAGING — CT CT ANGIO NECK
1 of 8 series · 7 of 33 positions shown · IV contrast (APPLIED)
Comparison: CT HEAD March 07, 2017 at 4634 hours and carotid
ultrasound February 10, 2016

CLINICAL DATA: Found unresponsive. Altered mental status. Suspect
stroke. History of hyperlipidemia, hypertension, bilateral carotid
artery stenosis, diabetes.

EXAM:
CT ANGIOGRAPHY HEAD AND NECK
TECHNIQUE: Multidetector CT imaging of the head and neck was performed using
the standard protocol during bolus administration of intravenous
contrast. Multiplanar CT image reconstructions and MIPs were
obtained to evaluate the vascular anatomy. Carotid stenosis
measurements (when applicable) are obtained utilizing NASCET
criteria, using the distal internal carotid diameter as the
denominator.
CONTRAST:  75 cc Isovue 370

[Series 5: cta head neck thins · axial · 0.42mm/px · z∈[-324,-90]mm · 7 of 627 slices shown]
[im 79/627  soft-tissue]
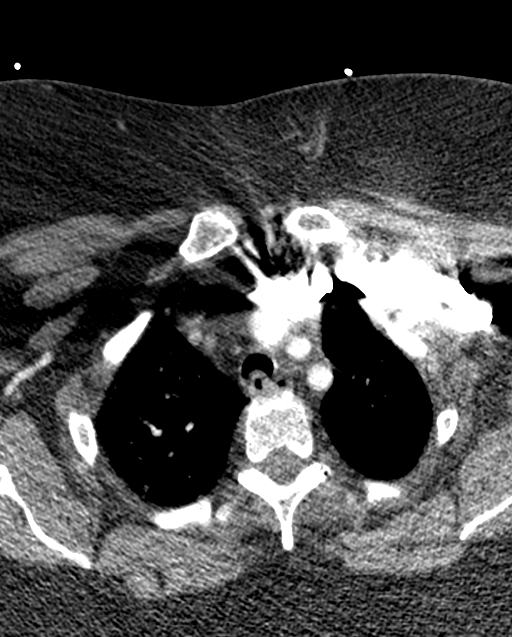
[im 157/627  bone]
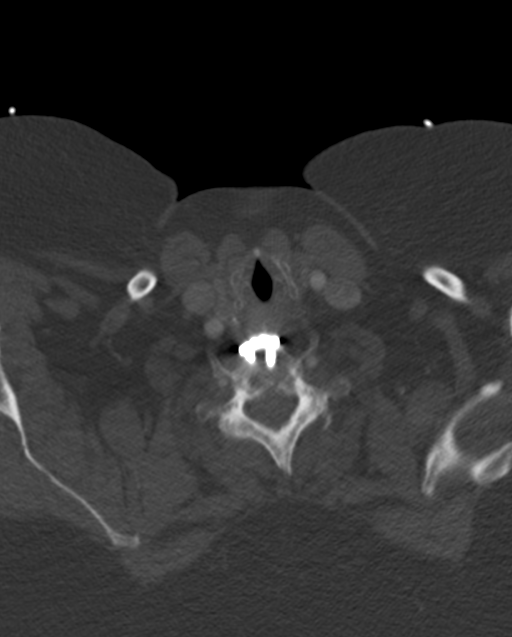
[im 235/627  soft-tissue]
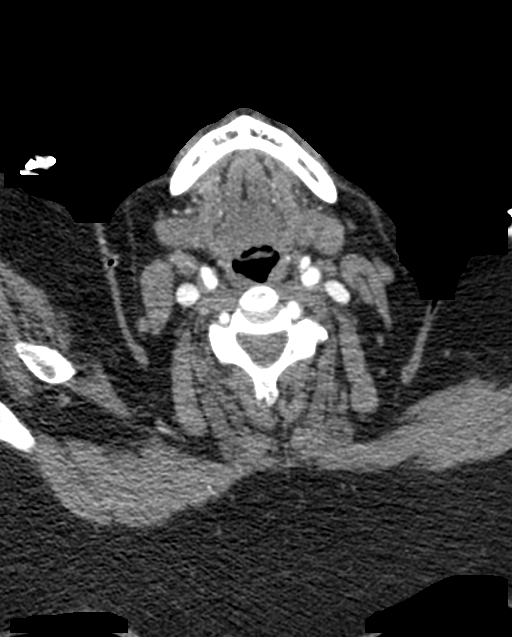
[im 314/627  bone]
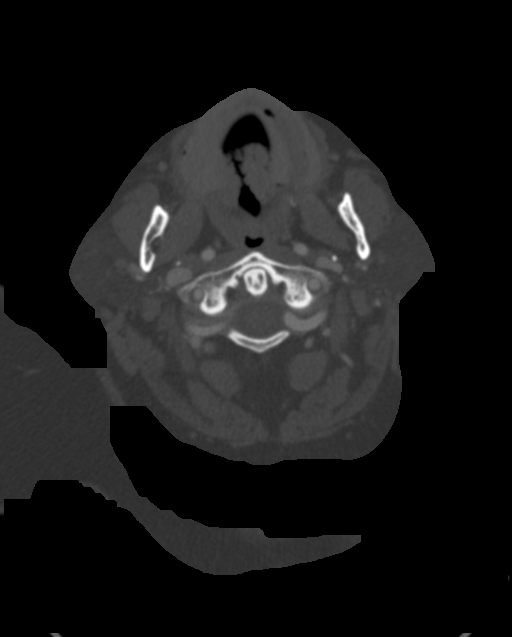
[im 392/627  soft-tissue]
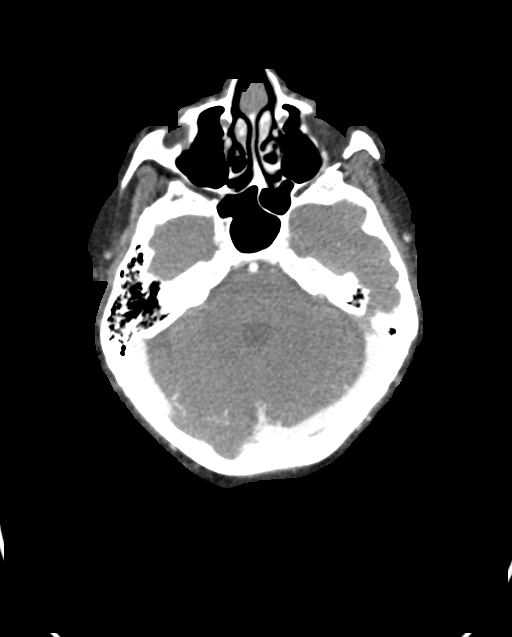
[im 470/627  bone]
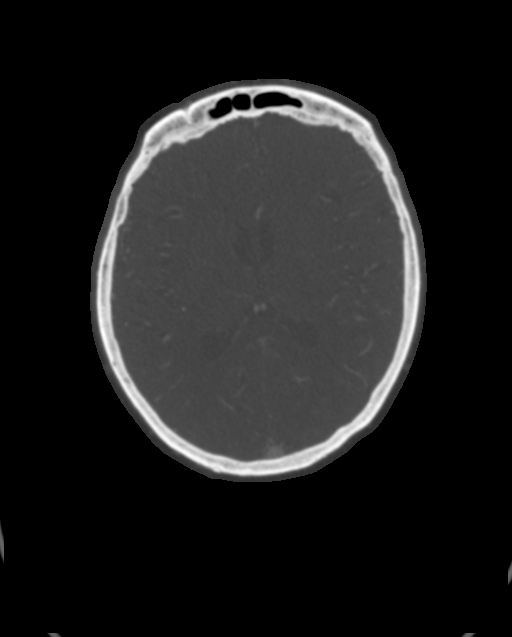
[im 548/627  soft-tissue]
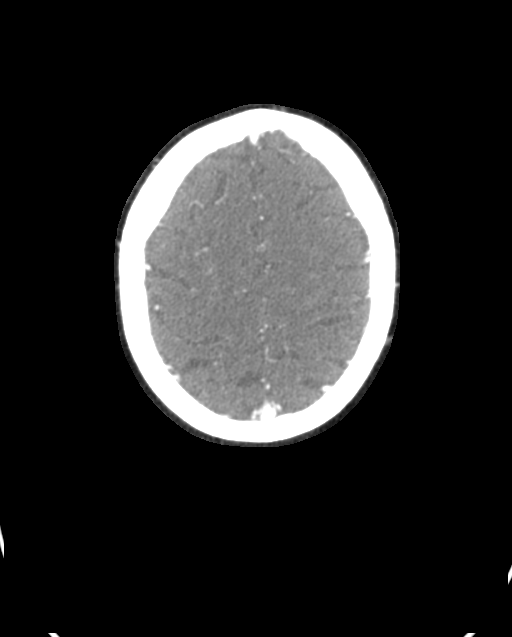

[7 of 33 positions shown; findings below may reference images not displayed]

FINDINGS: CTA NECK

AORTIC ARCH: Normal appearance of the thoracic arch, normal branch
pattern. Mild calcific atherosclerosis aortic arch. The The origins
of the innominate, left Common carotid artery and subclavian artery
are widely patent.

RIGHT CAROTID SYSTEM: Common carotid artery is widely patent,
coursing in a straight line fashion. Mild intimal thickening and
calcific atherosclerosis carotid bifurcation without hemodynamically
significant stenosis by NASCET criteria. Normal appearance of the
internal carotid artery.

LEFT CAROTID SYSTEM: Common carotid artery is widely patent,
coursing in a straight line fashion. Trace intimal thickening
carotid bifurcation without hemodynamically significant stenosis by
NASCET criteria. Normal appearance of the internal carotid artery.

VERTEBRAL ARTERIES:Codominant vertebral artery's. Normal appearance
of the vertebral arteries, which appear widely patent.

SKELETON: No acute osseous process though bone windows have not been
submitted. C5 through C7 ACDF with arthrodesis. Moderate to severe
LEFT C3-4 neural foraminal narrowing. Patient is edentulous.

OTHER NECK: Soft tissues of the neck are nonacute though, not
tailored for evaluation.

UPPER CHEST: Included lung apices are clear. No superior mediastinal
lymphadenopathy. Pulmonary vascular congestion.

CTA HEAD

ANTERIOR CIRCULATION: Patent cervical internal carotid arteries,
petrous, cavernous and supra clinoid internal carotid arteries.
Widely patent anterior communicating artery. Patent anterior and
middle cerebral arteries.

No large vessel occlusion, significant stenosis, contrast
extravasation or aneurysm.

POSTERIOR CIRCULATION: Patent vertebral arteries, vertebrobasilar
junction and basilar artery, as well as main branch vessels. Mild
calcific atherosclerosis RIGHT V4 segment. Patent posterior cerebral
arteries. Small bilateral posterior communicating arteries present.

No large vessel occlusion, significant stenosis, contrast
extravasation or aneurysm.

VENOUS SINUSES: Major dural venous sinuses are patent though not
tailored for evaluation on this angiographic examination.

ANATOMIC VARIANTS: None.

DELAYED PHASE: Not performed.

MIP images reviewed.
IMPRESSION: CTA NECK:

1. Atherosclerosis without hemodynamically significant stenosis or
acute vascular process.
2. Included chest demonstrates pulmonary vascular congestion.
CTA HEAD:

1. No emergent large vessel occlusion or significant stenosis.

## 2017-10-26 IMAGING — CT CT HEAD CODE STROKE
3 series · 15 of 47 positions shown, 18 images · non-contrast
Comparison: Head CT 07/19/2016

CLINICAL DATA: Code stroke.  Altered mental status

EXAM:
CT HEAD WITHOUT CONTRAST
TECHNIQUE: Contiguous axial images were obtained from the base of the skull
through the vertex without intravenous contrast.

[Series 2: head wo · axial · 0.40mm/px · z∈[+189,+314]mm · 9 of 31 slices shown, 12 images]
[im 3/31  brain]
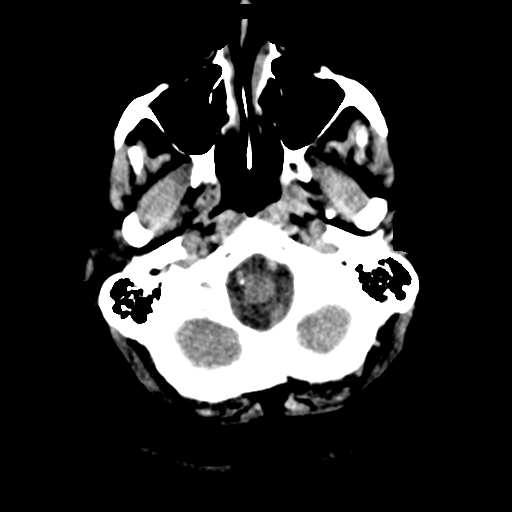
[im 3/31  bone]
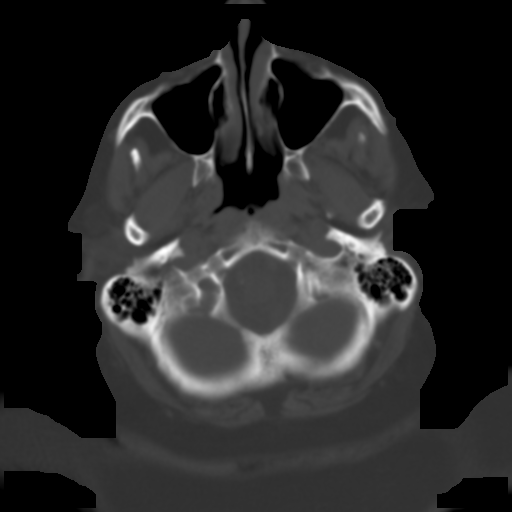
[im 6/31  brain]
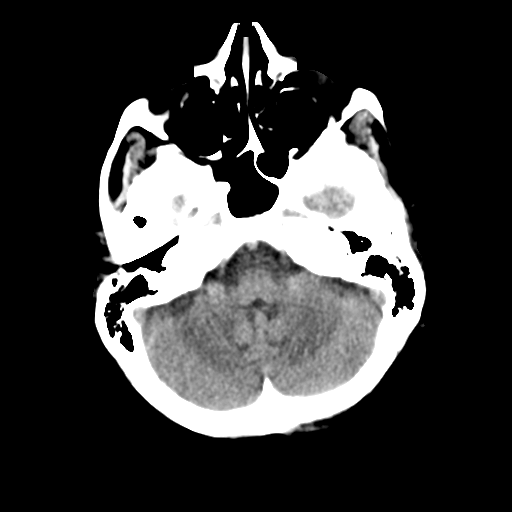
[im 9/31  brain]
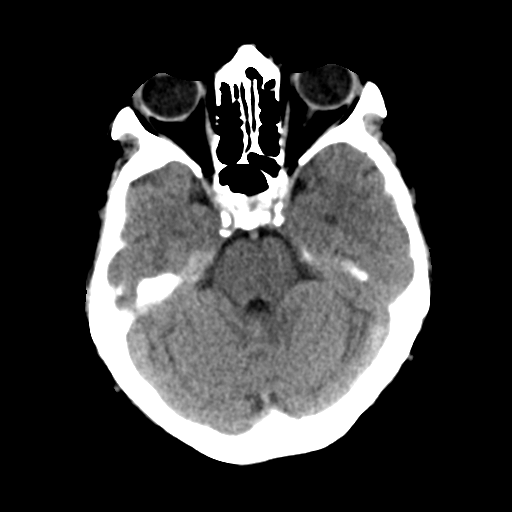
[im 12/31  brain]
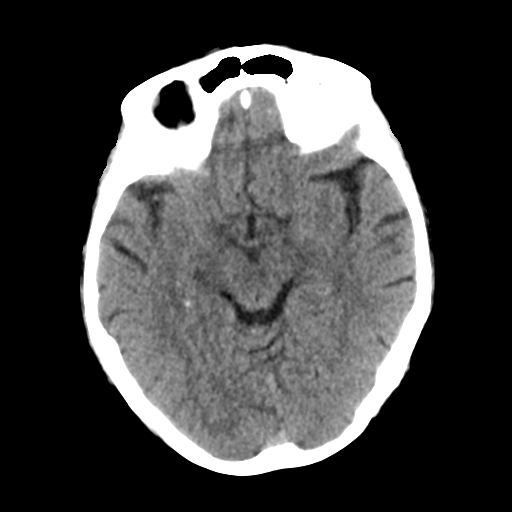
[im 16/31  brain]
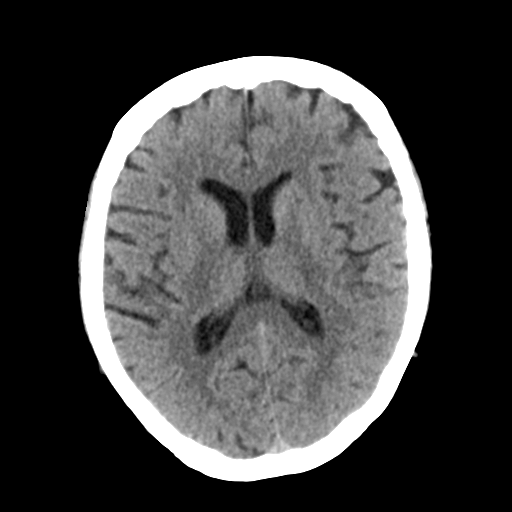
[im 16/31  bone]
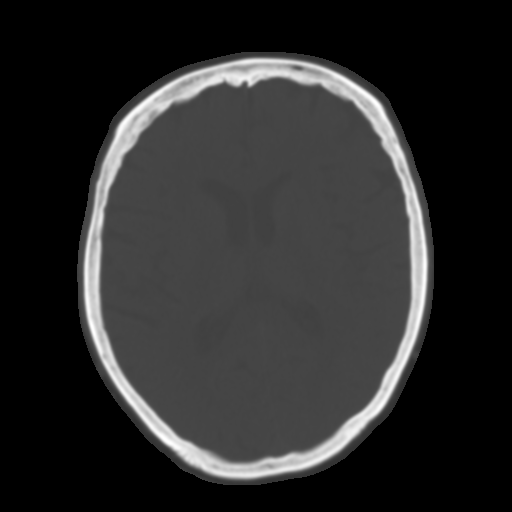
[im 19/31  brain]
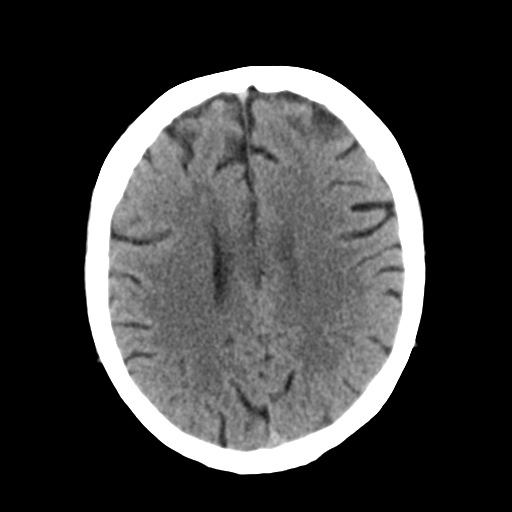
[im 22/31  brain]
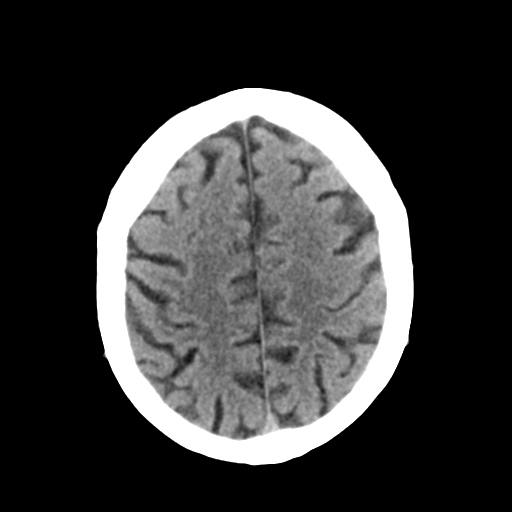
[im 25/31  brain]
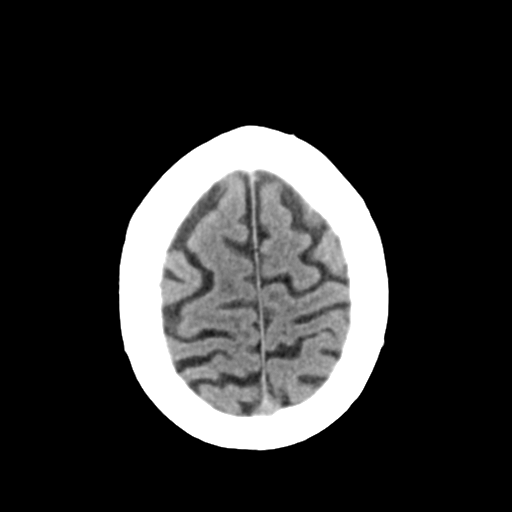
[im 28/31  brain]
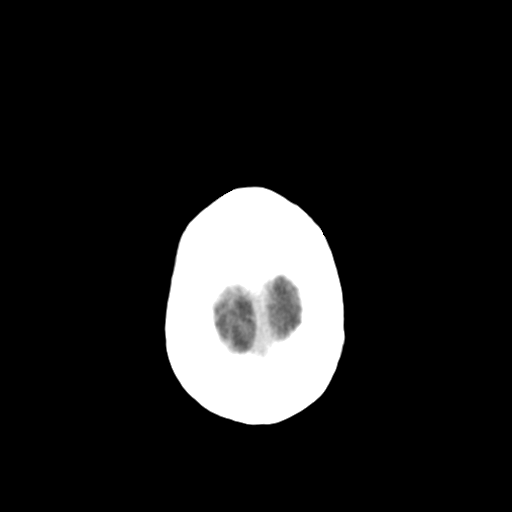
[im 28/31  bone]
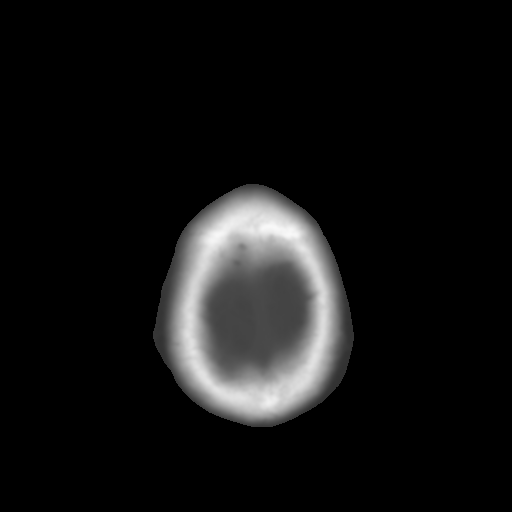

[Series 4: coronal soft tissue · coronal · 0.32mm/px · 3 of 60 slices shown]
[im 20/60  brain]
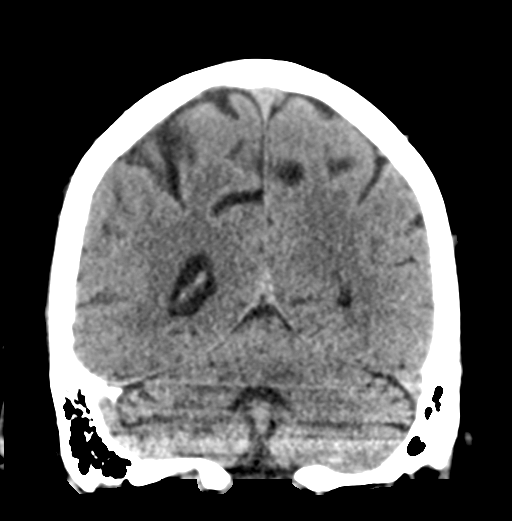
[im 27/60  brain]
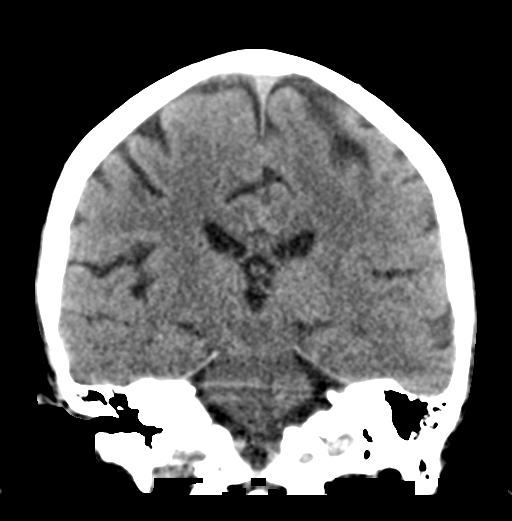
[im 33/60  brain]
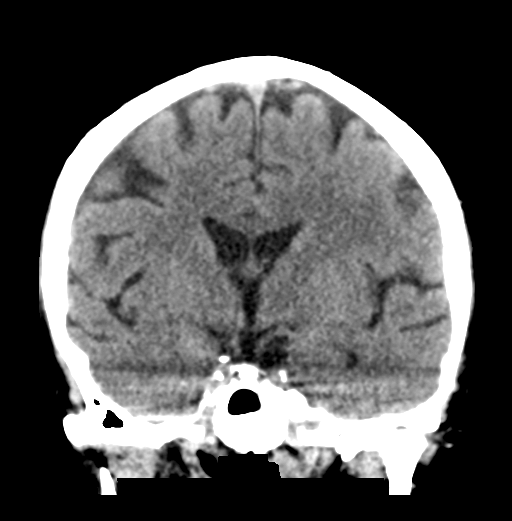

[Series 5: sagittal soft tissue · sagittal · 0.31mm/px · 3 of 48 slices shown]
[im 16/48  brain]
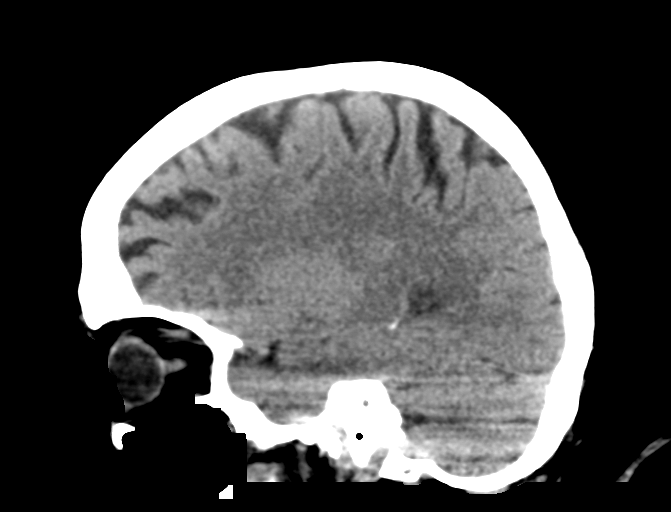
[im 24/48  brain]
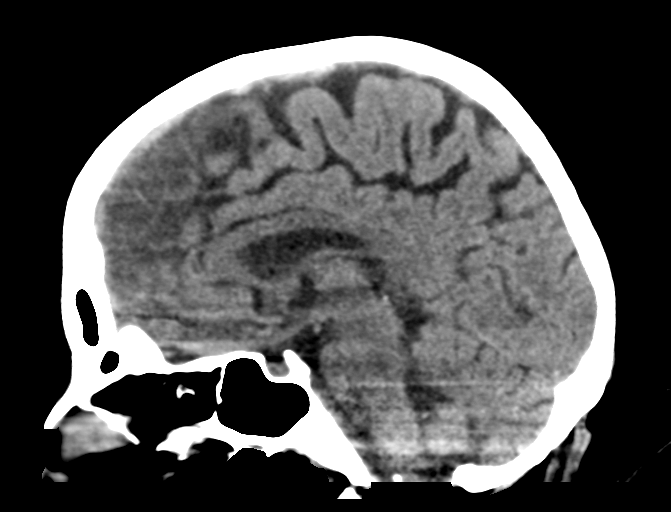
[im 32/48  brain]
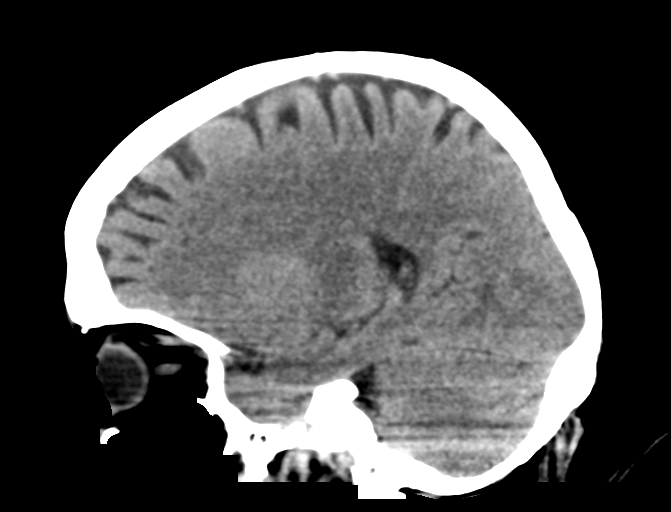

[15 of 47 positions shown; findings below may reference images not displayed]

FINDINGS: Brain: No mass lesion or acute hemorrhage. No focal hypoattenuation
of the basal ganglia or cortex to indicate infarcted tissue. No
hydrocephalus or age advanced atrophy.

Vascular: No hyperdense vessel. No advanced atherosclerotic
calcification of the arteries at the skull base.

Skull: Normal visualized skull base, calvarium and extracranial soft
tissues.

Sinuses/Orbits: No sinus fluid levels or advanced mucosal
thickening. No mastoid effusion. Normal orbits.

ASPECTS (Alberta Stroke Program Early CT Score)

- Ganglionic level infarction (caudate, lentiform nuclei, internal
capsule, insula, M1-M3 cortex): 7

- Supraganglionic infarction (M4-M6 cortex): 3

Total score (0-10 with 10 being normal): 10
IMPRESSION: 1. Normal head CT.
2. ASPECTS is 10.

These results were called by telephone at the time of interpretation
on 03/07/2017 at [DATE] to Dr. GHIZLENE VEN , who verbally
acknowledged these results.

## 2017-10-27 IMAGING — MR MR KNEE*L* W/O CM
8 series · 40 of 40 positions shown · non-contrast
Comparison: Left knee x-rays dated February 15, 2017.

CLINICAL DATA: Left medial knee pain since a twisting injury.

EXAM:
MRI OF THE LEFT KNEE WITHOUT CONTRAST
TECHNIQUE: Multiplanar, multisequence MR imaging of the knee was performed. No
intravenous contrast was administered.

[Series 3: PD fat-sat · axial · 3.0mm · 0.50mm/px · z∈[-44,+68]mm · 7 of 35 slices shown (1 of 4)]
[im 1/35]
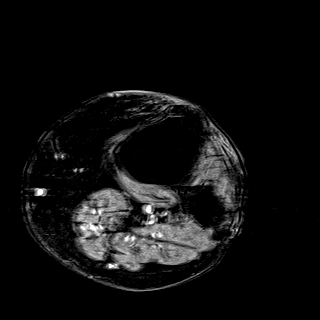
[im 6/35]
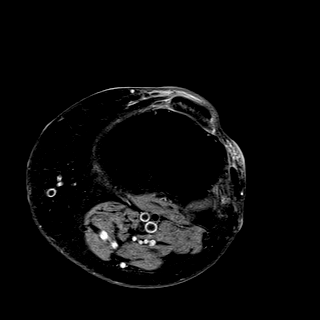
[im 12/35]
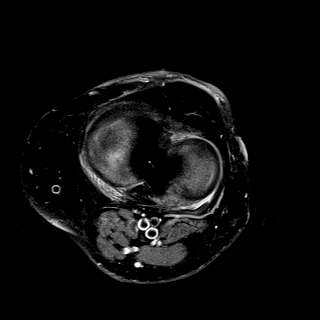
[im 18/35]
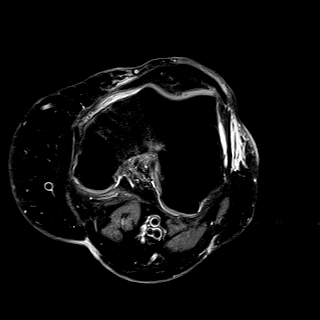
[im 23/35]
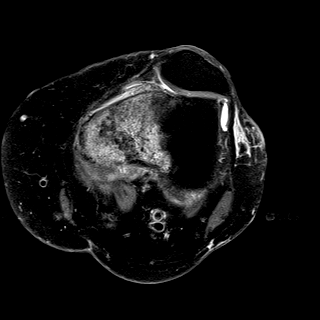
[im 29/35]
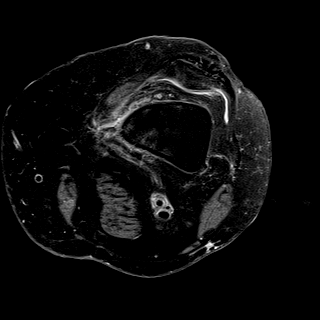
[im 35/35]
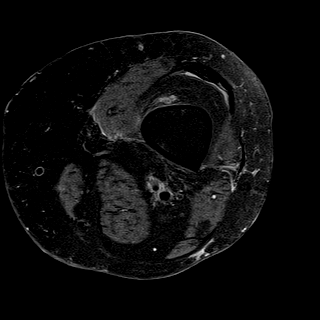

[Series 4: T1 · coronal · 3.0mm · 0.62mm/px · 5 of 33 slices shown]
[im 1/33]
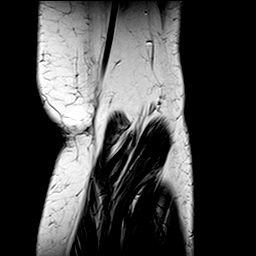
[im 9/33]
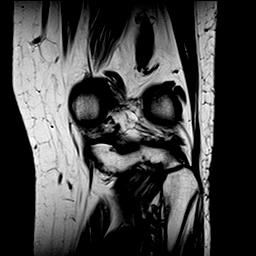
[im 17/33]
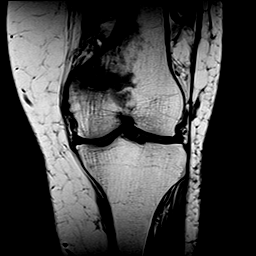
[im 25/33]
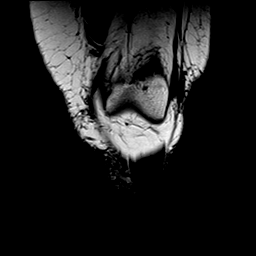
[im 33/33]
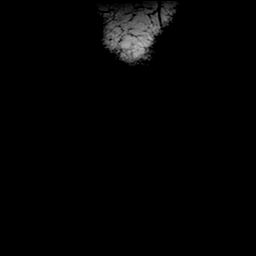

[Series 5: T2 fat-sat · coronal · 3.0mm · 0.31mm/px · 5 of 33 slices shown]
[im 1/33]
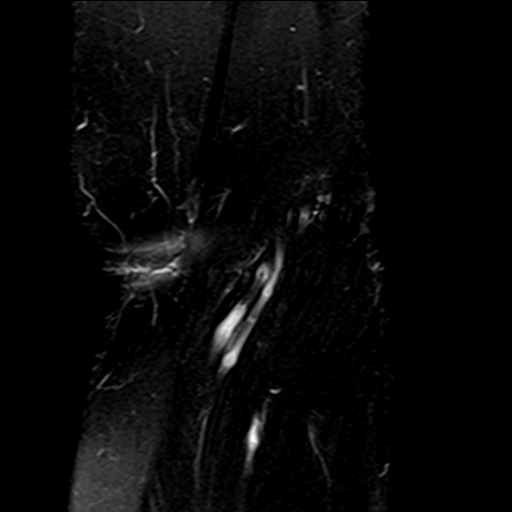
[im 9/33]
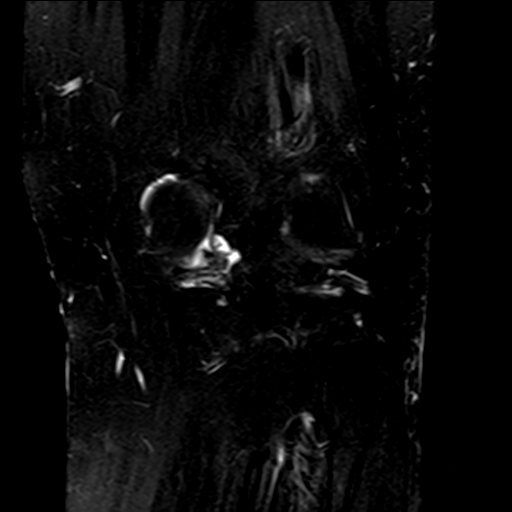
[im 17/33]
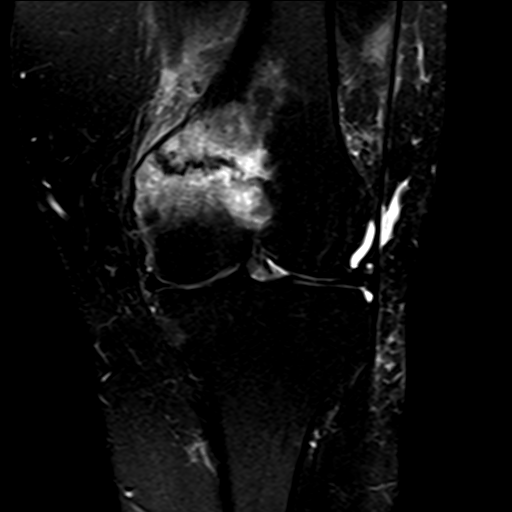
[im 25/33]
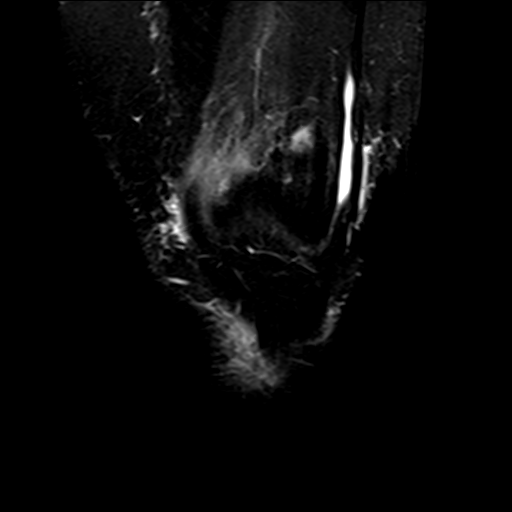
[im 33/33]
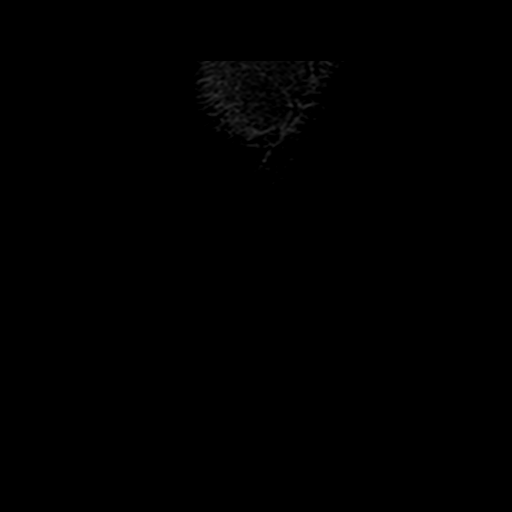

[Series 6: STIR · coronal · 3.0mm · 0.62mm/px · 5 of 32 slices shown (1 of 2)]
[im 1/32]
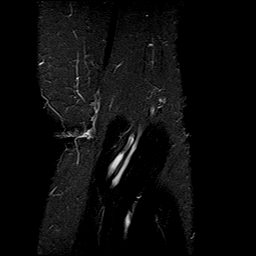
[im 8/32]
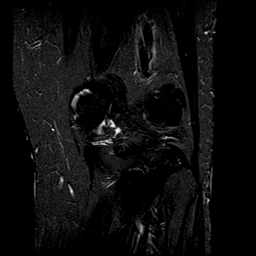
[im 16/32]
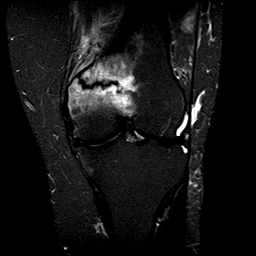
[im 24/32]
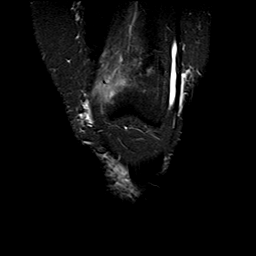
[im 32/32]
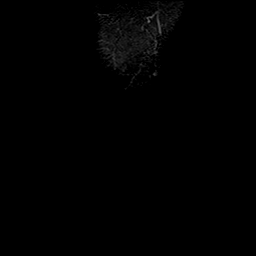

[Series 7: PD fat-sat · coronal · 3.0mm · 0.62mm/px · 5 of 33 slices shown (2 of 4)]
[im 1/33]
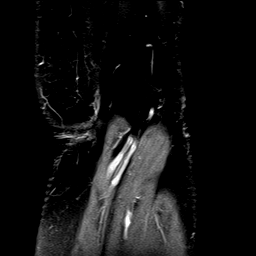
[im 9/33]
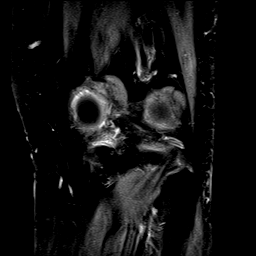
[im 17/33]
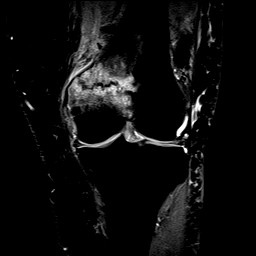
[im 25/33]
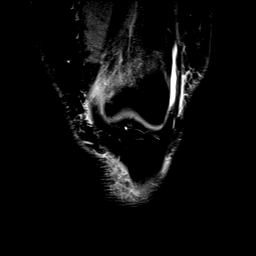
[im 33/33]
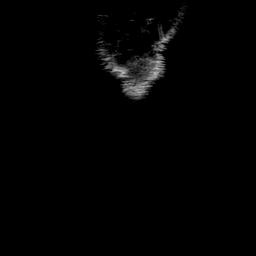

[Series 8: PD fat-sat · sagittal · 3.0mm · 0.62mm/px · 5 of 33 slices shown (3 of 4)]
[im 1/33]
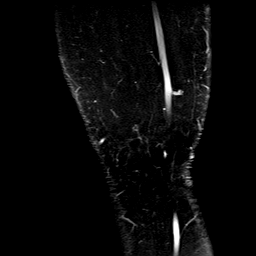
[im 9/33]
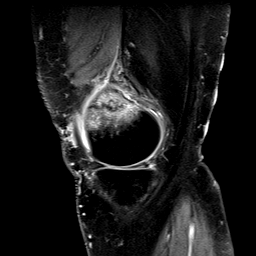
[im 17/33]
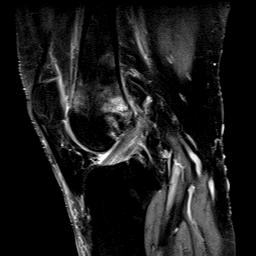
[im 25/33]
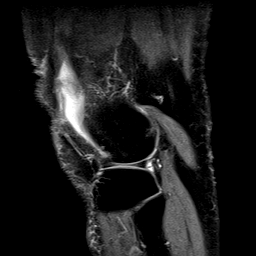
[im 33/33]
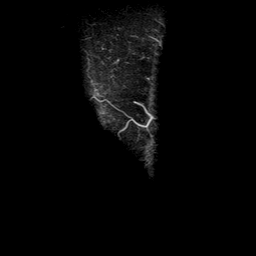

[Series 9: STIR · sagittal · 3.0mm · 0.62mm/px · 5 of 33 slices shown (2 of 2)]
[im 1/33]
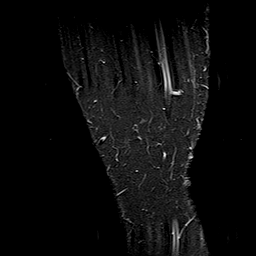
[im 9/33]
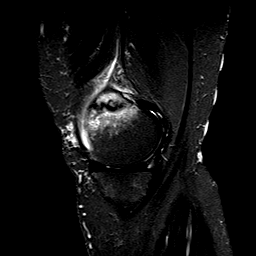
[im 17/33]
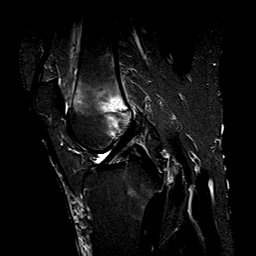
[im 25/33]
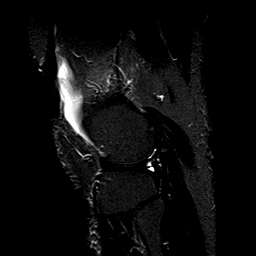
[im 33/33]
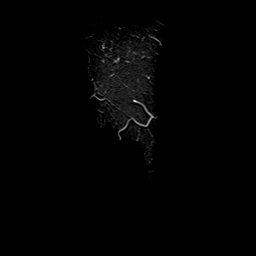

[Series 10: PD fat-sat · oblique · 2.0mm · 0.62mm/px · 3 of 17 slices shown (4 of 4)]
[im 1/17]
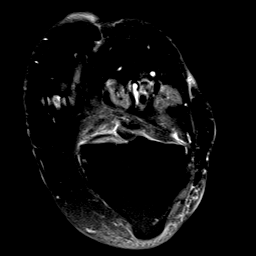
[im 9/17]
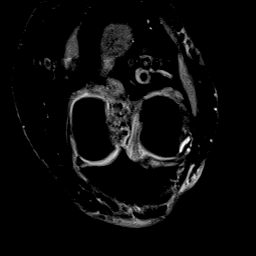
[im 17/17]
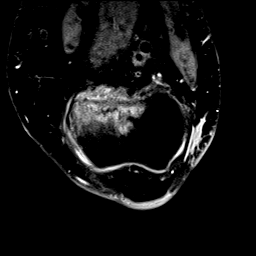

[40 of 40 positions shown; findings below may reference images not displayed]

FINDINGS: MENISCI

Medial meniscus: There is a horizontal tear of the body and
posterior horn.

Lateral meniscus: There is a complex tear of the body with vertical
and horizontal components.

LIGAMENTS

Cruciates:  Intact ACL and PCL.

Collaterals: Medial collateral ligament is intact. Lateral
collateral ligament complex is intact.

CARTILAGE

Patellofemoral: Mild superficial irregularity over the patellar
apex.

Medial:  No chondral defect.

Lateral:  No chondral defect.

Joint: Trace joint effusion. Normal Hoffa's fat. No plical
thickening.

Popliteal Fossa:  No Baker cyst.  Intact popliteus tendon.

Extensor Mechanism:  Intact quadriceps tendon and patellar tendon.

Bones: There is a nondisplaced fracture through the medial distal
femoral metaphysis with surrounding marrow and periosteal edema.

Other: None.
IMPRESSION: 1. Nondisplaced fracture through the medial aspect of the distal
femoral metaphysis.
2. Horizontal tear of the medial meniscus body and posterior horn.
3. Complex tear of the lateral meniscus body with vertical and
horizontal components.

## 2017-10-27 IMAGING — MR MR HEAD W/O CM
10 series · 48 of 48 positions shown · non-contrast
Comparison: CTA head 03/07/2017

CLINICAL DATA: Altered mental status

EXAM:
MRI HEAD WITHOUT CONTRAST
TECHNIQUE: Multiplanar, multiecho pulse sequences of the brain and surrounding
structures were obtained without intravenous contrast.

[Series 2: T1 · sagittal · 5.0mm · 0.45mm/px · 4 of 29 slices shown (1 of 2)]
[im 1/29]
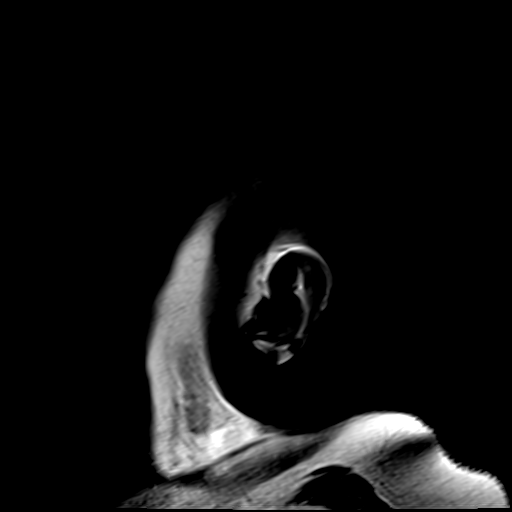
[im 10/29]
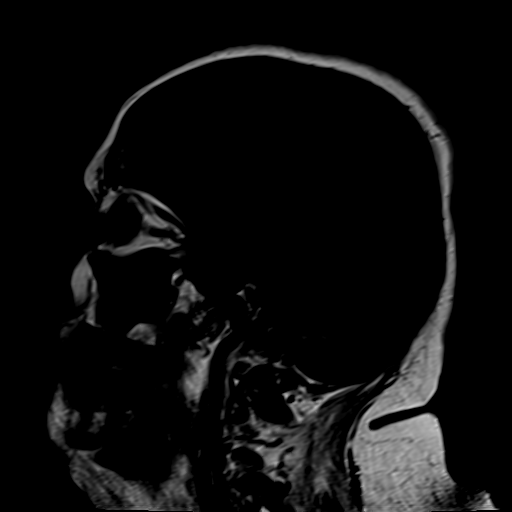
[im 19/29]
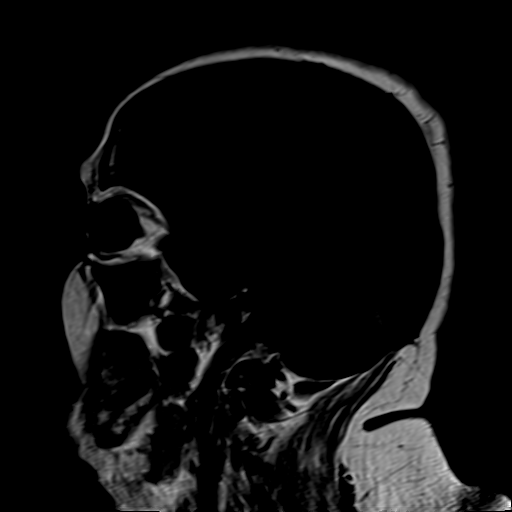
[im 29/29]
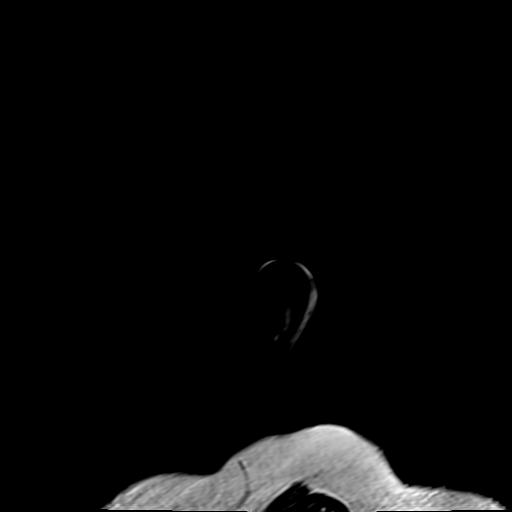

[Series 4: DWI · axial · 3.0mm · 1.80mm/px · z∈[-72,+79]mm · 8 of 55 slices shown (1 of 2)]
[im 1/55]
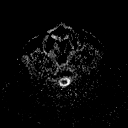
[im 8/55]
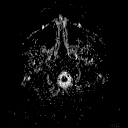
[im 16/55]
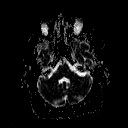
[im 24/55]
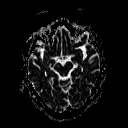
[im 31/55]
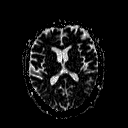
[im 39/55]
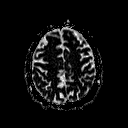
[im 47/55]
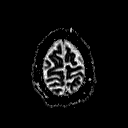
[im 55/55]
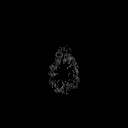

[Series 6: DWI · coronal · 3.0mm · 1.80mm/px · 5 of 45 slices shown (2 of 2)]
[im 1/45]
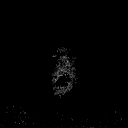
[im 12/45]
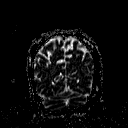
[im 23/45]
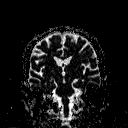
[im 34/45]
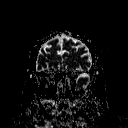
[im 45/45]
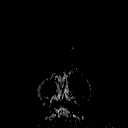

[Series 7: T2 · axial · 5.0mm · 0.90mm/px · z∈[-76,+82]mm · 3 of 27 slices shown (1 of 3)]
[im 1/27]
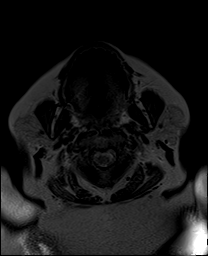
[im 14/27]
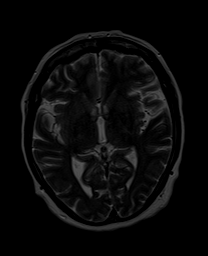
[im 27/27]
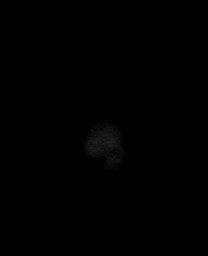

[Series 8: FLAIR · axial · 5.0mm · 0.45mm/px · z∈[-76,+82]mm · 3 of 27 slices shown]
[im 1/27]
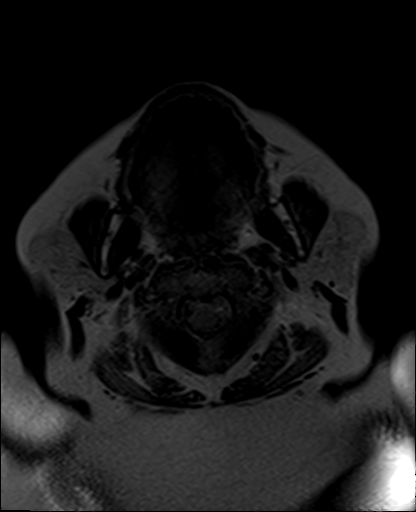
[im 14/27]
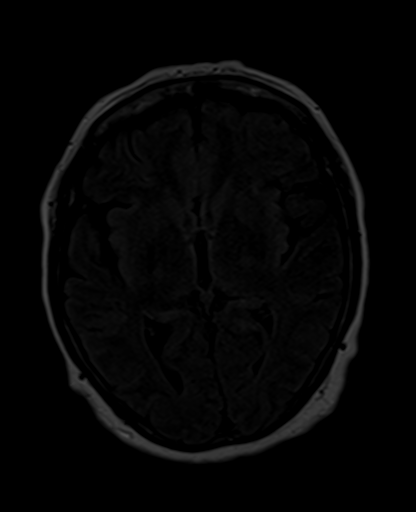
[im 27/27]
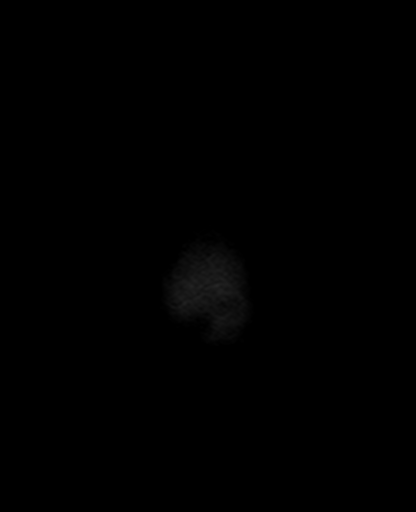

[Series 9: T2 · axial · 5.0mm · 0.45mm/px · z∈[-76,+82]mm · 3 of 27 slices shown (2 of 3)]
[im 1/27]
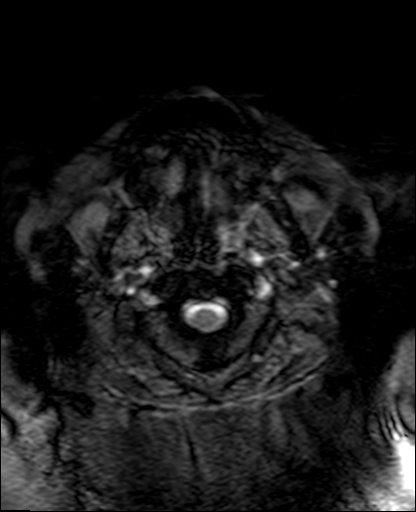
[im 14/27]
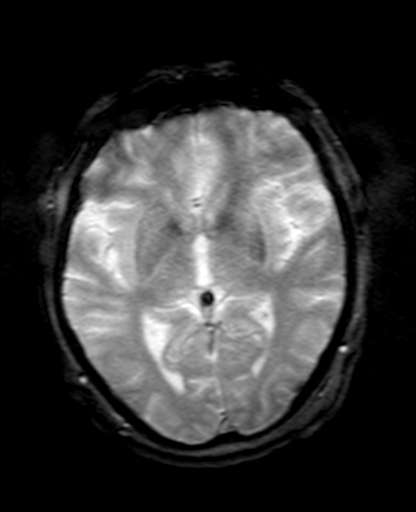
[im 27/27]
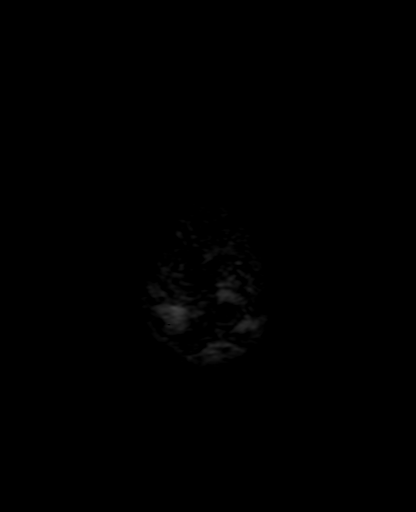

[Series 10: T1 · axial · 3.0mm · 1.00mm/px · z∈[-75,+90]mm · 7 of 60 slices shown (2 of 2)]
[im 1/60]
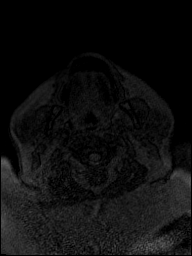
[im 10/60]
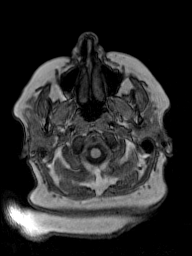
[im 20/60]
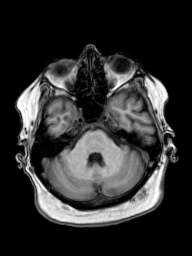
[im 30/60]
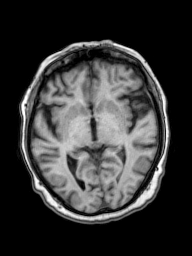
[im 40/60]
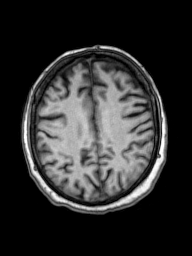
[im 50/60]
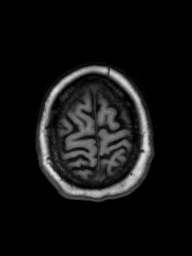
[im 60/60]
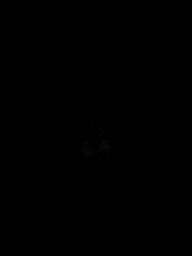

[Series 11: T2 · coronal · 5.0mm · 0.86mm/px · 3 of 29 slices shown (3 of 3)]
[im 1/29]
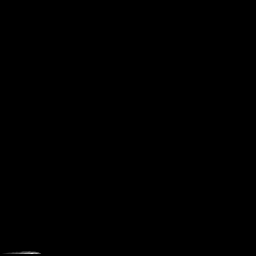
[im 15/29]
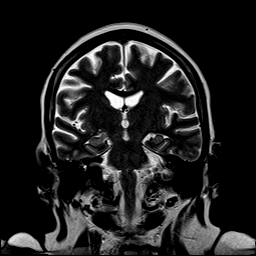
[im 29/29]
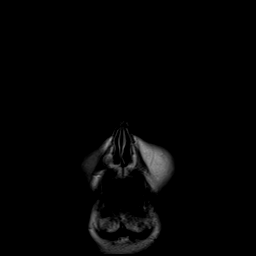

[Series 100: ax (id) · axial · 3.0mm · 1.80mm/px · z∈[-72,+79]mm · 7 of 55 slices shown]
[im 1/55]
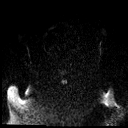
[im 10/55]
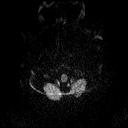
[im 19/55]
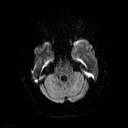
[im 28/55]
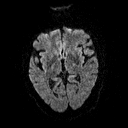
[im 37/55]
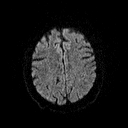
[im 46/55]
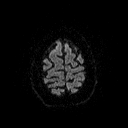
[im 55/55]
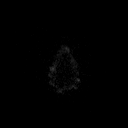

[Series 101: cor (id) · coronal · 3.0mm · 1.80mm/px · 5 of 45 slices shown]
[im 1/45]
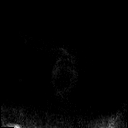
[im 12/45]
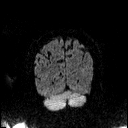
[im 23/45]
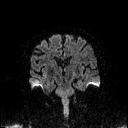
[im 34/45]
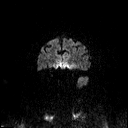
[im 45/45]
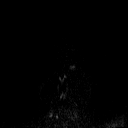

[48 of 48 positions shown; findings below may reference images not displayed]

FINDINGS: Brain: The midline structures are normal. There is no focal
diffusion restriction to indicate acute infarct. The brain
parenchymal signal is normal and there is no mass lesion. No
intraparenchymal hematoma or chronic microhemorrhage. Brain volume
is normal for age without lobar predominant atrophy. The dura is
normal and there is no extra-axial collection.

Vascular: Major intracranial arterial and venous sinus flow voids
are preserved.

Skull and upper cervical spine: The visualized skull base,
calvarium, upper cervical spine and extracranial soft tissues are
normal.

Sinuses/Orbits: No fluid levels or advanced mucosal thickening. No
mastoid or middle ear effusion. Normal orbits.
IMPRESSION: Normal MRI of the brain.

## 2017-11-01 ENCOUNTER — Telehealth: Payer: Self-pay | Admitting: Student in an Organized Health Care Education/Training Program

## 2017-11-01 NOTE — Telephone Encounter (Signed)
Patient wants to know her dosage of her meds?

## 2017-11-01 NOTE — Telephone Encounter (Signed)
Called pt for clarification of when her medication was due. Instructed that she could take med every 6 hours as needed for pain. Patient states that it took her 11 hours before she could get something for pain. Patient want me to talk with the head person [employer] and talk with her about getting med every 6 hour. She gave me number and it was incorrect. Will look up [employer] number and call and talk with them.

## 2017-11-02 NOTE — Telephone Encounter (Signed)
Called and left message at the [facility]

## 2017-11-05 ENCOUNTER — Emergency Department: Payer: Medicare Other

## 2017-11-05 ENCOUNTER — Emergency Department
Admission: EM | Admit: 2017-11-05 | Discharge: 2017-11-05 | Disposition: A | Discharge status: Home / Self Care | Payer: Medicare Other | Attending: Emergency Medicine | Admitting: Emergency Medicine

## 2017-11-05 ENCOUNTER — Other Ambulatory Visit: Payer: Self-pay

## 2017-11-05 DIAGNOSIS — I10 Essential (primary) hypertension: Secondary | ICD-10-CM | POA: Insufficient documentation

## 2017-11-05 DIAGNOSIS — M25552 Pain in left hip: Secondary | ICD-10-CM | POA: Insufficient documentation

## 2017-11-05 DIAGNOSIS — E1040 Type 1 diabetes mellitus with diabetic neuropathy, unspecified: Secondary | ICD-10-CM | POA: Diagnosis not present

## 2017-11-05 DIAGNOSIS — W19XXXA Unspecified fall, initial encounter: Secondary | ICD-10-CM | POA: Insufficient documentation

## 2017-11-05 DIAGNOSIS — M25551 Pain in right hip: Secondary | ICD-10-CM | POA: Diagnosis not present

## 2017-11-05 DIAGNOSIS — M542 Cervicalgia: Secondary | ICD-10-CM | POA: Insufficient documentation

## 2017-11-05 DIAGNOSIS — Y999 Unspecified external cause status: Secondary | ICD-10-CM | POA: Diagnosis not present

## 2017-11-05 DIAGNOSIS — M549 Dorsalgia, unspecified: Secondary | ICD-10-CM | POA: Diagnosis not present

## 2017-11-05 DIAGNOSIS — T07XXXA Unspecified multiple injuries, initial encounter: Secondary | ICD-10-CM | POA: Insufficient documentation

## 2017-11-05 DIAGNOSIS — Y939 Activity, unspecified: Secondary | ICD-10-CM | POA: Insufficient documentation

## 2017-11-05 DIAGNOSIS — M25512 Pain in left shoulder: Secondary | ICD-10-CM | POA: Insufficient documentation

## 2017-11-05 DIAGNOSIS — J45909 Unspecified asthma, uncomplicated: Secondary | ICD-10-CM | POA: Diagnosis not present

## 2017-11-05 DIAGNOSIS — M25511 Pain in right shoulder: Secondary | ICD-10-CM | POA: Diagnosis not present

## 2017-11-05 DIAGNOSIS — Y929 Unspecified place or not applicable: Secondary | ICD-10-CM | POA: Insufficient documentation

## 2017-11-05 LAB — COMPREHENSIVE METABOLIC PANEL
ALK PHOS: 166 U/L — AB (ref 38–126)
ALT: 23 U/L (ref 14–54)
ANION GAP: 9 (ref 5–15)
AST: 27 U/L (ref 15–41)
Albumin: 3.5 g/dL (ref 3.5–5.0)
BUN: 25 mg/dL — ABNORMAL HIGH (ref 6–20)
CALCIUM: 9.1 mg/dL (ref 8.9–10.3)
CO2: 27 mmol/L (ref 22–32)
CREATININE: 0.92 mg/dL (ref 0.44–1.00)
Chloride: 101 mmol/L (ref 101–111)
Glucose, Bld: 139 mg/dL — ABNORMAL HIGH (ref 65–99)
Potassium: 3.7 mmol/L (ref 3.5–5.1)
Sodium: 137 mmol/L (ref 135–145)
Total Bilirubin: 0.9 mg/dL (ref 0.3–1.2)
Total Protein: 6.9 g/dL (ref 6.5–8.1)

## 2017-11-05 LAB — CBC WITH DIFFERENTIAL/PLATELET
BASOS ABS: 0 10*3/uL (ref 0–0.1)
BASOS PCT: 0 %
Eosinophils Absolute: 0.1 10*3/uL (ref 0–0.7)
Eosinophils Relative: 2 %
HCT: 35.3 % (ref 35.0–47.0)
HEMOGLOBIN: 12.1 g/dL (ref 12.0–16.0)
Lymphocytes Relative: 11 %
Lymphs Abs: 0.9 10*3/uL — ABNORMAL LOW (ref 1.0–3.6)
MCH: 31.9 pg (ref 26.0–34.0)
MCHC: 34.1 g/dL (ref 32.0–36.0)
MCV: 93.5 fL (ref 80.0–100.0)
Monocytes Absolute: 0.8 10*3/uL (ref 0.2–0.9)
Monocytes Relative: 11 %
NEUTROS ABS: 5.7 10*3/uL (ref 1.4–6.5)
NEUTROS PCT: 76 %
Platelets: 119 10*3/uL — ABNORMAL LOW (ref 150–440)
RBC: 3.78 MIL/uL — AB (ref 3.80–5.20)
RDW: 15.8 % — ABNORMAL HIGH (ref 11.5–14.5)
WBC: 7.6 10*3/uL (ref 3.6–11.0)

## 2017-11-05 LAB — URINALYSIS, COMPLETE (UACMP) WITH MICROSCOPIC
BILIRUBIN URINE: NEGATIVE
Bacteria, UA: NONE SEEN
GLUCOSE, UA: NEGATIVE mg/dL
HGB URINE DIPSTICK: NEGATIVE
KETONES UR: NEGATIVE mg/dL
LEUKOCYTES UA: NEGATIVE
NITRITE: NEGATIVE
PH: 5 (ref 5.0–8.0)
Protein, ur: NEGATIVE mg/dL
Specific Gravity, Urine: 1.011 (ref 1.005–1.030)

## 2017-11-05 LAB — TROPONIN I: Troponin I: <0.03 ng/mL (ref <0.03)

## 2017-11-05 MED ORDER — SODIUM CHLORIDE 0.9 % IV BOLUS
1000.0000 mL | Freq: Once | INTRAVENOUS | Status: AC
  Administered 2017-11-05: 1000 mL via INTRAVENOUS

## 2017-11-05 MED ORDER — ONDANSETRON HCL 4 MG/2ML IJ SOLN
4.0000 mg | Freq: Once | INTRAMUSCULAR | Status: AC
  Administered 2017-11-05: 4 mg via INTRAVENOUS
  Filled 2017-11-05: qty 2

## 2017-11-05 MED ORDER — MORPHINE SULFATE (PF) 4 MG/ML IV SOLN
4.0000 mg | Freq: Once | INTRAVENOUS | Status: AC
Start: 2017-11-05 — End: 2017-11-05
  Administered 2017-11-05: 4 mg via INTRAVENOUS
  Filled 2017-11-05: qty 1

## 2017-11-05 NOTE — ED Notes (Signed)
Placed pt on 2 L nasal cannula. Instructed to breath in nose and out mouth. Pt states that morphine always makes her oxygen drop when she has received in while in hospital after back surgery. No resp distress noted. Will continue to monitor.

## 2017-11-05 NOTE — ED Notes (Signed)
MD at bedside. 

## 2017-11-05 NOTE — ED Notes (Signed)
When attempting to ambulate pt states she hasn't been able to walk for one week. She lives at assisted living. Obtained food for pt and she was able to sit up to side of bed to eat. Pt also states has home health care at the assisted living.

## 2017-11-05 NOTE — ED Notes (Signed)
Pt ate all her lunch, and ambulated with walker to the toilet and gave urine sample. Pt allowed to put her pajama bottoms back on which she did herself and then ambulated to a recliner chair while awaiting results and discharge. Pt is off monitor at this time.

## 2017-11-05 NOTE — ED Notes (Signed)
Pt calling for ride

## 2017-11-05 NOTE — ED Notes (Signed)
Pt remains at scans at this time.

## 2017-11-05 NOTE — ED Notes (Signed)
Pt ambulatory to toilet with walker.

## 2017-11-05 NOTE — ED Notes (Signed)
Pt requesting to wait for her ride in the lobby. Pt is a&ox3. Pt taken to lobby in w/c to await her ride.

## 2017-11-05 NOTE — Discharge Instructions (Addendum)
Continue to take your regular medications, and your usual pain medication as needed.    Return to the ER for new or worsening weakness, persistent or recurrent falls, or any other new or worsening symptoms that concern you.

## 2017-11-05 NOTE — ED Provider Notes (Signed)
[facility] Emergency Department Provider Note ____________________________________________   First MD Initiated Contact with Patient 11/05/17 [phone]     (approximate)  I have reviewed the triage vital signs and the nursing notes.   HISTORY  Chief Complaint Fall    HPI Stephanie [name] is a 67 y.o. female with PMH as noted below who presents with pain in multiple areas after several falls over the last few days, primarily in her bilateral hips and knees, bilateral shoulders, and in her neck and back.  The patient states that her falls happen when she lost control of her walker, however she states that she did feel dizzy after 1 of the falls.  She denies feeling weak or dizzy in general.  There are acute symptoms.  Past Medical History:  Diagnosis Date  . Adenomatous colon polyp 08/27/2014   Polyps x 3  . Anemia   . Anxiety   . 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 Arthritis   . Asthma   . Bipolar affective disorder ([place])    h/o  . Blood transfusion 2000  . Blood transfusion without reported diagnosis   . Cancer ([place])   . Chronic abdominal pain    abdominal wall pain  . 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 Colon polyp   . Depression   . Diabetes mellitus without complication ([place])   . Fatty liver 04/09/08   found in abd CT  . GERD (gastroesophageal reflux disease)   . H/O: CVA (cardiovascular accident)    TIA  . H/O: rheumatic fever   . Hepatitis    HEP "B" twenty years ago  . Hyperlipidemia   . Incontinence   . Insomnia   . Lower extremity edema   . Obstructive sleep apnea    Use C-PAP twice per week  . Platelets decreased ([place])   . RLS (restless legs syndrome)   . Shortness of breath   . Sleep apnea   . Stroke [facility])    TIA- 2002  . Ulcer   . Unspecified disorders of nervous system     Patient Active Problem List   Diagnosis Date Noted  . Tricompartment osteoarthritis of right knee 10/18/2017  . Sepsis ([place]) 07/20/2017  .  Obesity, Class III, BMI 40-49.9 (morbid obesity) ([place]) 05/19/2017  . Cervical stenosis of spine 04/25/2017  . Stress fracture of femur 04/17/2017  . Elevated alkaline phosphatase level 04/16/2017  . Altered mental status 03/07/2017  . Degenerative tear of lateral meniscus of right knee 11/15/2016  . Encephalopathy 07/19/2016  . Degenerative tear of medial meniscus of right knee 07/01/2016  . Primary osteoarthritis of right knee 07/01/2016  . Diabetes mellitus type 1, uncontrolled, without complications ([place]) 48/18/5631  . Lymphedema 06/14/2016  . Pain in limb 05/06/2016  . Swelling of limb 05/06/2016  . Postphlebitic syndrome with inflammation 05/06/2016  . Pneumonia 04/18/2016  . Chest pain 04/04/2016  . Iron deficiency anemia 01/30/2016  . Bilateral carotid artery stenosis 01/12/2016  . Hypoglycemia 09/09/2015  . Hyperglycemia 07/16/2015  . Neuropathy 06/08/2015  . Thrombocytopenia ([place]) 08/19/2014  . Abdominal pain, chronic, epigastric 06/12/2014  . Liver cirrhosis secondary to NASH ([place]) 02/27/2014  . Dizziness and giddiness 10/30/2013  . DOE (dyspnea on exertion) 10/30/2013  . Poorly controlled type 2 diabetes mellitus with complication ([place]) 49/70/2637  . DNR (do not resuscitate) 07/29/2013  . Encounter for routine gynecological examination 07/29/2013  . Dyspnea 07/10/2013  . Lung nodule 07/10/2013  .  Varicose veins of left lower extremity 10/09/2012  . GERD (gastroesophageal reflux disease) 10/09/2012  . Lower extremity edema 02/02/2012  . Cirrhosis of liver ([place]) 02/02/2012  . Anxiety 07/11/2011  . Aortic dissection ([place])   . BACK PAIN, LUMBAR, CHRONIC 11/19/2009  . UNS ADVRS EFF OTH RX MEDICINAL&BIOLOGICAL SBSTNC 12/16/2008  . UNSPECIFIED VITAMIN D DEFICIENCY 09/26/2008  . TOBACCO ABUSE 06/19/2008  . RESTLESS LEGS SYNDROME 04/27/2007  . INSOMNIA 04/27/2007  . HLD (hyperlipidemia) 03/08/2007  . OSA (obstructive sleep apnea) 03/08/2007  . Essential hypertension  03/08/2007  . Asthma 03/08/2007  . BIPOLAR AFFECTIVE DISORDER, HX OF 03/08/2007    Past Surgical History:  Procedure Laterality Date  . ABDOMINAL EXPLORATION SURGERY    . ABDOMINAL HYSTERECTOMY     total  . Axillary artery cannulation via 8-mm Hemashield graft, median sternotomy, extracorporeal circulation with deep hypothermic circulatory arrest, repair of  aortic dessection  01/23/2009   Dr Arlyce Dice  . BACK SURGERY      x 5  . cataract     both eyes  . CHOLECYSTECTOMY    . COLONOSCOPY    . EYE SURGERY Bilateral    Cataract Extraction with IOL  . HEMORROIDECTOMY     and colon polyp removed  . KNEE ARTHROSCOPY WITH LATERAL RELEASE Right 11/15/2016   Procedure: KNEE ARTHROSCOPY WITH LATERAL RELEASE;  Surgeon: Corky Mull, MD;  Location: [facility] ORS;  Service: Orthopedics;  Laterality: Right;  . LIVER BIOPSY  04/10/2012   Procedure: LIVER BIOPSY;  Surgeon: Inda Castle, MD;  Location: [facility];  Service: Endoscopy;  Laterality: N/A;  ultrasound to mark order for abd limited/liver to be marked to be sent by [email or web address]   . LUMBAR FUSION  10/09  . LUMBAR WOUND DEBRIDEMENT  05/09/2011   Procedure: LUMBAR WOUND DEBRIDEMENT;  Surgeon: Dahlia Bailiff;  Location: [facility];  Service: Orthopedics;  Laterality: N/A;  IRRIGATION AND DEBRIDEMENT SPINAL WOUND  . POSTERIOR CERVICAL FUSION/FORAMINOTOMY    . ROTATOR CUFF REPAIR     left    Prior to Admission medications   Medication Sig Start Date End Date Taking? Authorizing Provider  acetaminophen (TYLENOL) 325 MG tablet Take 650 mg by mouth every 6 (six) hours as needed for mild pain.   Yes [provider]  albuterol (PROVENTIL HFA;VENTOLIN HFA) 108 (90 Base) MCG/ACT inhaler Inhale 2 puffs into the lungs every 6 (six) hours as needed for wheezing or shortness of breath.   Yes [provider]  albuterol (PROVENTIL) (2.5 MG/3ML) 0.083% nebulizer solution Take 2.5 mg by nebulization every 6 (six) hours as needed for wheezing or  shortness of breath.   Yes [provider]  ALPRAZolam (XANAX) 0.25 MG tablet Take 0.25 mg by mouth 2 (two) times daily as needed for sleep.   Yes [provider]  budesonide-formoterol (SYMBICORT) 160-4.5 MCG/ACT inhaler Inhale 2 puffs into the lungs 2 (two) times daily.    Yes [provider]  buPROPion (WELLBUTRIN XL) 150 MG 24 hr tablet Take 1 tablet (150 mg total) by mouth daily. 10/12/17  Yes Ursula Alert, MD  clonazePAM (KLONOPIN) 0.5 MG tablet Take 0.5 mg by mouth 3 (three) times daily.    Yes [provider]  Colloidal Oatmeal (EUCERIN ECZEMA RELIEF) 1 % CREA Apply 1 application topically 2 (two) times daily. Apply to right lower leg twice daily.   Yes [provider]  diclofenac sodium (VOLTAREN) 1 % GEL Apply 2 g topically every 8 (eight) hours as needed (arthritis). Apply  to right knee   Yes [provider]  dicyclomine (BENTYL) 10 MG capsule Take 10 mg by mouth 3 (three) times daily.    Yes [provider]  ferrous sulfate 325 (65 FE) MG tablet Take 325 mg by mouth 2 (two) times daily.    Yes [provider]  furosemide (LASIX) 80 MG tablet Take 80 mg by mouth.   Yes [provider]  gabapentin (NEURONTIN) 600 MG tablet Take 600 mg by mouth 2 (two) times daily.   Yes [provider]  guaiFENesin-codeine (ROBITUSSIN AC) 100-10 MG/5ML syrup Take 15 mLs by mouth every 6 (six) hours as needed for cough.   Yes [provider]  hydrochlorothiazide (MICROZIDE) 12.5 MG capsule Take 12.5 mg by mouth daily with supper.   Yes [provider]  HYDROcodone-acetaminophen (NORCO) 7.5-325 MG tablet Take 1 tablet by mouth every 6 (six) hours as needed for moderate pain. 10/18/17 11/17/17 Yes King, Diona Foley, NP  insulin glargine (LANTUS) 100 UNIT/ML injection Inject 50 Units into the skin every evening.    Yes [provider]  insulin lispro (HUMALOG) 100 UNIT/ML injection Inject 8 Units  into the skin 3 (three) times daily before meals.    Yes [provider]  lactulose (CHRONULAC) 10 GM/15ML solution Take 10 g by mouth daily. (0800) 10/17/16  Yes [provider]  lamoTRIgine (LAMICTAL) 100 MG tablet Take 1 tablet (100 mg total) by mouth 2 (two) times daily. (0800 & 2000) 10/12/17  Yes Ursula Alert, MD  lamoTRIgine (LAMICTAL) 25 MG tablet Take 1 tablet (25 mg total) by mouth daily. To be added to the Lamictal 100 mg in the AM 10/12/17  Yes Eappen, Saramma, MD  lidocaine (LIDODERM) 5 % Place 2 patches onto the skin daily. Apply 1 patch to 2 areas on back (2 patches) every morning and remove at bedtime.   Yes [provider]  liraglutide (VICTOZA) 18 MG/3ML SOPN Inject 1.8 mg into the skin daily.   Yes [provider]  LYRICA 150 MG capsule Take 150 mg by mouth 3 (three) times daily. (0800, 1400 & 2000) 11/29/16  Yes [provider]  metFORMIN (GLUCOPHAGE-XR) 500 MG 24 hr tablet Take 500 mg by mouth twice daily. 09/04/17  Yes [provider]  metoCLOPramide (REGLAN) 5 MG tablet Take 5 mg by mouth 4 (four) times daily -  before meals and at bedtime. (0800, 1200, 1600 & 2000)   Yes [provider]  mirtazapine (REMERON) 30 MG tablet Take 1.5 tablets (45 mg total) by mouth at bedtime. 10/12/17  Yes Ursula Alert, MD  neomycin-bacitracin-polymyxin (NEOSPORIN) [phone] ointment Apply 1 application topically every 12 (twelve) hours as needed (to right forearm).   Yes [provider]  nystatin (MYCOSTATIN/NYSTOP) powder Apply topically 4 (four) times daily. Patient taking differently: Apply topically 4 (four) times daily. Apply to inner thigh/ peri area twice daily and apply to abdomen 4 times daily. 08/07/17  Yes Corcoran, Drue Second, MD  omeprazole (PRILOSEC) 20 MG capsule Take 20 mg by mouth daily.    Yes [provider]  ondansetron (ZOFRAN) 4 MG tablet Take 4 mg by mouth every 4 (four) hours as needed for nausea or  vomiting.   Yes [provider]  oxybutynin (DITROPAN-XL) 5 MG 24 hr tablet Take 5 mg by mouth daily. (0800)   Yes [provider]  rOPINIRole (REQUIP) 3 MG tablet Take 3 mg by mouth at bedtime. (2000)   Yes [provider]  senna (SENOKOT) 8.6 MG tablet Take 2 tablets by mouth at bedtime.   Yes [provider]  simvastatin (ZOCOR) 10 MG tablet Take 10 mg by mouth at bedtime. (2000)   Yes [provider]  spironolactone (ALDACTONE) 50 MG tablet Take 50 mg by mouth 2 (two) times daily. (0800)   Yes [provider]  vitamin C (ASCORBIC ACID) 500 MG tablet Take 500 mg by mouth 2 (two) times daily. (0800 & 2000)   Yes [provider]  zolpidem (AMBIEN) 5 MG tablet Take 5 mg by mouth at bedtime. (2000)   Yes [provider]  potassium chloride (K-DUR,KLOR-CON) 10 MEQ tablet Take 1 tablet (10 mEq total) by mouth daily for 4 days. 08/15/17 08/19/17  Harvest Dark, MD    Allergies Doxycycline  Family History  Problem Relation Age of Onset  . Breast cancer Mother   . Lung cancer Mother   . Stomach cancer Father   . Diabetes Unknown        4 aunts, and 1 uncle  . Depression Brother   . Anxiety disorder Brother   . Colon cancer Neg Hx   . Esophageal cancer Neg Hx   . Rectal cancer Neg Hx     Social History Social History   Tobacco Use  . Smoking status: Former Smoker    Packs/day: 1.50    Years: 50.00    Pack years: 75.00    Types: Cigarettes    Last attempt to quit: 04/06/2013    Years since quitting: 4.5  . Smokeless tobacco: Never Used  Substance Use Topics  . Alcohol use: No    Alcohol/week: 0.0 oz  . Drug use: No    Review of Systems  Constitutional: No fever. Eyes: No redness. ENT: Positive for neck pain. Cardiovascular: Denies chest pain. Respiratory: Denies shortness of breath. Gastrointestinal: No abdominal pain. Genitourinary: Negative for flank pain.  Musculoskeletal: Positive for back  pain. Skin: Negative for abrasions or lacerations. Neurological: Negative for headache.   ____________________________________________   PHYSICAL EXAM:  VITAL SIGNS: ED Triage Vitals  Enc Vitals Group     BP 11/05/17 0828 (!) 97/44     Pulse Rate 11/05/17 0828 87     Resp 11/05/17 0828 12     Temp 11/05/17 0828 97.7 F (36.5 C)     Temp Source 11/05/17 0828 Oral     SpO2 11/05/17 0828 98 %     Weight 11/05/17 0824 225 lb (102.1 kg)     Height 11/05/17 0824 5' 3"  (1.6 m)     Head Circumference --      Peak Flow --      Pain Score 11/05/17 0823 8     Pain Loc --      Pain Edu? --      Excl. in [place]? --     Constitutional: Alert and oriented.  Relatively comfortable appearing and in no acute distress. Eyes: Conjunctivae are normal.  EOMI. Head: Atraumatic. Nose: No congestion/rhinnorhea. Mouth/Throat: Mucous membranes are dry.   Neck: Normal range of motion.  Mild midline cervical spinal tenderness. Cardiovascular: Normal rate, regular rhythm. Grossly normal heart sounds.  Good peripheral circulation. Respiratory: Normal respiratory effort.  No retractions. Lungs CTAB. Gastrointestinal: Soft and nontender. No distention.  Genitourinary: No flank tenderness. Musculoskeletal:  Extremities warm and well perfused.  Possible slight shortening of right leg.  Pain on range of motion of bilateral hips and knees as well as bilateral shoulders. Neurologic:  Normal speech and  language.  Motor and sensory intact in all extremities.  No gross focal neurologic deficits are appreciated.  Skin:  Skin is warm and dry. No rash noted. Psychiatric: Mood and affect are normal. Speech and behavior are normal.  ____________________________________________   LABS (all labs ordered are listed, but only abnormal results are displayed)  Labs Reviewed  COMPREHENSIVE METABOLIC PANEL - Abnormal; Notable for the following components:      Result Value   Glucose, Bld 139 (*)    BUN 25 (*)     Alkaline Phosphatase 166 (*)    All other components within normal limits  CBC WITH DIFFERENTIAL/PLATELET - Abnormal; Notable for the following components:   RBC 3.78 (*)    RDW 15.8 (*)    Platelets 119 (*)    Lymphs Abs 0.9 (*)    All other components within normal limits  URINALYSIS, COMPLETE (UACMP) WITH MICROSCOPIC - Abnormal; Notable for the following components:   Color, Urine YELLOW (*)    APPearance CLEAR (*)    All other components within normal limits  TROPONIN I   ____________________________________________  EKG  ED ECG REPORT I, Arta Silence, the attending physician, personally viewed and interpreted this ECG.  Date: 11/05/2017 EKG Time: 827 Rate: 87 Rhythm: normal sinus rhythm QRS Axis: normal Intervals: normal ST/T Wave abnormalities: normal Narrative Interpretation: no evidence of acute ischemia  ____________________________________________  RADIOLOGY  CT head: No acute findings CT C-spine: No acute fracture XR T spine: No acute fracture XR L spine: No acute fracture XR hips: Right hip questionable minimally impacted femoral neck fracture XR R knee: No acute fracture XR shoulders: No acute fracture  CT right hip: No acute fracture  ____________________________________________   PROCEDURES  Procedure(s) performed: No  Procedures  Critical Care performed: No ____________________________________________   INITIAL IMPRESSION / ASSESSMENT AND PLAN / ED COURSE  Pertinent labs & imaging results that were available during my care of the patient were reviewed by me and considered in my medical decision making (see chart for details).  67 year old female with PMH as noted above presents with pain to multiple areas after several falls this week, and saying that she may have been dizzy associated with the falls.  She denies feeling weak overall.  I reviewed the past medical records in epic; the patient was seen in the ED last month and 2  months ago after falls.  She was admitted earlier this year for a urinary tract infection with sepsis.  The exam is as described above.  The patient is borderline hypotensive but other vital signs are normal.  She appears dehydrated.  She has pain in the neck, bilateral shoulders, bilateral hips, and the right knee.  She also reports pain throughout her back.  I am concerned that the recurrent falls could be due to a primary cause such as dehydration, other metabolic abnormality, or infection, so we will obtain work-up for these causes in addition to imaging of the relevant areas.  ----------------------------------------- 1:35 PM on 11/05/2017 -----------------------------------------  Imaging was negative except for questionable right femoral neck fracture.  I obtained a CT which ruled this out.  Patient's lab work-up is unremarkable.  On reassessment, the patient is able to ambulate with the walker.  I had an extensive discussion with her about whether she felt comfortable in her current level of care (which is assisted living) and whether she felt like she was safe ambulating with a walker.  Patient was adamant that she did feel safe and that  she would like to go home.  She declined physical therapy evaluation in the emergency department.  Patient's vital signs have remained stable and her blood pressure has improved.  She appears much more comfortable and ate a meal.  She feels well to go home.  Return precautions given, and the patient expresses understanding.   ____________________________________________   FINAL CLINICAL IMPRESSION(S) / ED DIAGNOSES  Final diagnoses:  Multiple contusions      NEW MEDICATIONS STARTED DURING THIS VISIT:  New Prescriptions   No medications on file     Note:  This document was prepared using Dragon voice recognition software and may include unintentional dictation errors.    Arta Silence, MD 11/05/17 [phone]

## 2017-11-05 NOTE — ED Notes (Signed)
Pt taken to CT via stretcher.

## 2017-11-05 NOTE — ED Notes (Signed)
Pt returned from scans via stretcher.

## 2017-11-05 NOTE — ED Triage Notes (Addendum)
Pt arrives from the [facility] for multiple falls this past week. Pt c/o bilat shoulder pain. Pt c/o R knee pain. EMS reports pain with bearing weight, barely able to walk. Pt states she has fallen in bathroom twice this week. Uses a walker. Pt moaning upon assessment. Denies hitting head, denies LOC. Denies blood thinner use. A&O x 4. States first fall she tripped. States "the second time I guess I got dizzy, I don't know what you call it." states she took a hydrocodone about 5am.

## 2017-11-06 ENCOUNTER — Ambulatory Visit: Payer: Medicare Other | Admitting: Hematology and Oncology

## 2017-11-06 ENCOUNTER — Ambulatory Visit (INDEPENDENT_AMBULATORY_CARE_PROVIDER_SITE_OTHER): Payer: Medicare Other | Admitting: Licensed Clinical Social Worker

## 2017-11-06 DIAGNOSIS — F3130 Bipolar disorder, current episode depressed, mild or moderate severity, unspecified: Secondary | ICD-10-CM | POA: Diagnosis not present

## 2017-11-07 ENCOUNTER — Inpatient Hospital Stay: Payer: Medicare Other | Attending: Hematology and Oncology | Admitting: Hematology and Oncology

## 2017-11-07 ENCOUNTER — Encounter: Payer: Self-pay | Admitting: Hematology and Oncology

## 2017-11-07 VITALS — BP 100/69 | HR 89 | Temp 95.7°F | Resp 18 | Wt 229.1 lb

## 2017-11-07 DIAGNOSIS — Z86010 Personal history of colonic polyps: Secondary | ICD-10-CM | POA: Diagnosis not present

## 2017-11-07 DIAGNOSIS — F419 Anxiety disorder, unspecified: Secondary | ICD-10-CM | POA: Insufficient documentation

## 2017-11-07 DIAGNOSIS — Z794 Long term (current) use of insulin: Secondary | ICD-10-CM | POA: Diagnosis not present

## 2017-11-07 DIAGNOSIS — D696 Thrombocytopenia, unspecified: Secondary | ICD-10-CM

## 2017-11-07 DIAGNOSIS — J45909 Unspecified asthma, uncomplicated: Secondary | ICD-10-CM | POA: Insufficient documentation

## 2017-11-07 DIAGNOSIS — G8918 Other acute postprocedural pain: Secondary | ICD-10-CM | POA: Diagnosis not present

## 2017-11-07 DIAGNOSIS — G4700 Insomnia, unspecified: Secondary | ICD-10-CM

## 2017-11-07 DIAGNOSIS — R609 Edema, unspecified: Secondary | ICD-10-CM | POA: Insufficient documentation

## 2017-11-07 DIAGNOSIS — E119 Type 2 diabetes mellitus without complications: Secondary | ICD-10-CM | POA: Diagnosis not present

## 2017-11-07 DIAGNOSIS — R161 Splenomegaly, not elsewhere classified: Secondary | ICD-10-CM | POA: Diagnosis not present

## 2017-11-07 DIAGNOSIS — Z800 Family history of malignant neoplasm of digestive organs: Secondary | ICD-10-CM | POA: Diagnosis not present

## 2017-11-07 DIAGNOSIS — K7581 Nonalcoholic steatohepatitis (NASH): Secondary | ICD-10-CM

## 2017-11-07 DIAGNOSIS — Z801 Family history of malignant neoplasm of trachea, bronchus and lung: Secondary | ICD-10-CM | POA: Diagnosis not present

## 2017-11-07 DIAGNOSIS — G8929 Other chronic pain: Secondary | ICD-10-CM

## 2017-11-07 DIAGNOSIS — Z79899 Other long term (current) drug therapy: Secondary | ICD-10-CM

## 2017-11-07 DIAGNOSIS — Z8673 Personal history of transient ischemic attack (TIA), and cerebral infarction without residual deficits: Secondary | ICD-10-CM | POA: Diagnosis not present

## 2017-11-07 DIAGNOSIS — Z803 Family history of malignant neoplasm of breast: Secondary | ICD-10-CM

## 2017-11-07 DIAGNOSIS — G2581 Restless legs syndrome: Secondary | ICD-10-CM | POA: Insufficient documentation

## 2017-11-07 DIAGNOSIS — K7460 Unspecified cirrhosis of liver: Secondary | ICD-10-CM | POA: Insufficient documentation

## 2017-11-07 DIAGNOSIS — R748 Abnormal levels of other serum enzymes: Secondary | ICD-10-CM

## 2017-11-07 DIAGNOSIS — D509 Iron deficiency anemia, unspecified: Secondary | ICD-10-CM

## 2017-11-07 DIAGNOSIS — E785 Hyperlipidemia, unspecified: Secondary | ICD-10-CM

## 2017-11-07 DIAGNOSIS — K219 Gastro-esophageal reflux disease without esophagitis: Secondary | ICD-10-CM | POA: Diagnosis not present

## 2017-11-07 DIAGNOSIS — Z87891 Personal history of nicotine dependence: Secondary | ICD-10-CM | POA: Insufficient documentation

## 2017-11-07 NOTE — Progress Notes (Signed)
[facility] day:  11/07/2017   Chief Complaint: Stephanie [name] is a 67 y.o. female with chronic thrombocytopenia and iron deficiency anemia who is seen for 3 month assessment.  HPI: The patient was last seen in the medical oncology clinic on  08/07/2017.  At that time, she had right knee pain.  She noted chronic bruising.  Exam revealed no petechiae.  She had a Candidal fungal infection beneath her pannus.  Alkaline phosphatase has been followed:  180 on 04/23/2017, 143 on 07/19/2017, and 176 on 07/28/2017  [facility] labs on 10/26/2017 revealed a hematocrit of 38.0, hemoglobin 13.2, MCV 93, platelets 115,000, white count 6400 with an [place] of 4900.  Comprehensive metabolic panel included a creatinine of 0.9.  Liver function tests included T of 27, ALT 20 and alkaline phosphatase 205.  INR was 1.0.  AFP was 5.1.   She was seen in the [facility] ER on 11/05/2017 s/p a fall.  She described several falls over a few days.  Falls occurred after she lost control of her walker.  Imaging was negative except for a questionable right femoral neck fracture.  CT scan of the RIGHT hip was negative for fracture.   CBC revealed a hematocrit of 35.3, hemoglobin 12.1, MCV 93.5, platelets 119,000, white count 7600 with an [place] of 5700.  AST was 27, ALT 23, alkaline phosphatase 166 and bilirubin 0.9.  Urinalysis was negative.  Troponin was negative.  Patient presents to the clinic today with complaints of continued post-operative pain in her knee. She is followed by orthopedics and the pain clinic. Patient is having difficulty sleeping at night due to her pain.   Patient continues on lactulose three times a day. She has been seen in the past by Dr. Allen Norris. Notes reviewed that dicussed evaluation at [facility] for a possible liver transplant.   Patient denies that she has experienced any B symptoms. Additionally, she denies any interval infections. Patient maintains an adequate appetite, and  notes that she is eating well. Weight, compared to her last visit to the clinic, has increased by 2  pounds.    Past Medical History:  Diagnosis Date  . Adenomatous colon polyp 08/27/2014   Polyps x 3  . Anemia   . Anxiety   . 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 Arthritis   . Asthma   . Bipolar affective disorder ([place])    h/o  . Blood transfusion 2000  . Blood transfusion without reported diagnosis   . Cancer ([place])   . Chronic abdominal pain    abdominal wall pain  . 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 Colon polyp   . Depression   . Diabetes mellitus without complication ([place])   . Fatty liver 04/09/08   found in abd CT  . GERD (gastroesophageal reflux disease)   . H/O: CVA (cardiovascular accident)    TIA  . H/O: rheumatic fever   . Hepatitis    HEP "B" twenty years ago  . Hyperlipidemia   . Incontinence   . Insomnia   . Lower extremity edema   . Obstructive sleep apnea    Use C-PAP twice per week  . Platelets decreased ([place])   . RLS (restless legs syndrome)   . Shortness of breath   . Sleep apnea   . Stroke [facility])    TIA- 2002  . Ulcer   . Unspecified disorders of nervous system  Past Surgical History:  Procedure Laterality Date  . ABDOMINAL EXPLORATION SURGERY    . ABDOMINAL HYSTERECTOMY     total  . Axillary artery cannulation via 8-mm Hemashield graft, median sternotomy, extracorporeal circulation with deep hypothermic circulatory arrest, repair of  aortic dessection  01/23/2009   Dr Arlyce Dice  . BACK SURGERY      x 5  . cataract     both eyes  . CHOLECYSTECTOMY    . COLONOSCOPY    . EYE SURGERY Bilateral    Cataract Extraction with IOL  . HEMORROIDECTOMY     and colon polyp removed  . KNEE ARTHROSCOPY WITH LATERAL RELEASE Right 11/15/2016   Procedure: KNEE ARTHROSCOPY WITH LATERAL RELEASE;  Surgeon: Corky Mull, MD;  Location: [facility] ORS;  Service: Orthopedics;  Laterality: Right;  . LIVER BIOPSY  04/10/2012   Procedure: LIVER BIOPSY;  Surgeon:  Inda Castle, MD;  Location: [facility];  Service: Endoscopy;  Laterality: N/A;  ultrasound to mark order for abd limited/liver to be marked to be sent by [email or web address]   . LUMBAR FUSION  10/09  . LUMBAR WOUND DEBRIDEMENT  05/09/2011   Procedure: LUMBAR WOUND DEBRIDEMENT;  Surgeon: Dahlia Bailiff;  Location: [facility];  Service: Orthopedics;  Laterality: N/A;  IRRIGATION AND DEBRIDEMENT SPINAL WOUND  . POSTERIOR CERVICAL FUSION/FORAMINOTOMY    . ROTATOR CUFF REPAIR     left    Family History  Problem Relation Age of Onset  . Breast cancer Mother   . Lung cancer Mother   . Stomach cancer Father   . Diabetes Unknown        4 aunts, and 1 uncle  . Depression Brother   . Anxiety disorder Brother   . Colon cancer Neg Hx   . Esophageal cancer Neg Hx   . Rectal cancer Neg Hx     Social History:  reports that she quit smoking about 4 years ago. Her smoking use included cigarettes. She has a 75.00 pack-year smoking history. She has never used smokeless tobacco. She reports that she does not drink alcohol or use drugs.  She is a resident at the [facility].  She is accompanied by Hebert Soho, the transportation specialist from [employer], today.  Allergies:  Allergies  Allergen Reactions  . Doxycycline Hives, Swelling, Other (See Comments) and Nausea Only    Reaction:  Facial swelling  Face red and swollen; no difficulty breathing    Current Medications: Current Outpatient Medications  Medication Sig Dispense Refill  . acetaminophen (TYLENOL) 325 MG tablet Take 650 mg by mouth every 6 (six) hours as needed for mild pain.    Marland Kitchen albuterol (PROVENTIL HFA;VENTOLIN HFA) 108 (90 Base) MCG/ACT inhaler Inhale 2 puffs into the lungs every 6 (six) hours as needed for wheezing or shortness of breath.    Marland Kitchen albuterol (PROVENTIL) (2.5 MG/3ML) 0.083% nebulizer solution Take 2.5 mg by nebulization every 6 (six) hours as needed for wheezing or shortness of breath.    . ALPRAZolam (XANAX) 0.25 MG tablet Take  0.25 mg by mouth 2 (two) times daily as needed for sleep.    . budesonide-formoterol (SYMBICORT) 160-4.5 MCG/ACT inhaler Inhale 2 puffs into the lungs 2 (two) times daily.     Marland Kitchen buPROPion (WELLBUTRIN XL) 150 MG 24 hr tablet Take 1 tablet (150 mg total) by mouth daily. 90 tablet 1  . clonazePAM (KLONOPIN) 0.5 MG tablet Take 0.5 mg by mouth 3 (three) times daily.     . Colloidal Oatmeal (EUCERIN  ECZEMA RELIEF) 1 % CREA Apply 1 application topically 2 (two) times daily. Apply to right lower leg twice daily.    . diclofenac sodium (VOLTAREN) 1 % GEL Apply 2 g topically every 8 (eight) hours as needed (arthritis). Apply to right knee    . dicyclomine (BENTYL) 10 MG capsule Take 10 mg by mouth 3 (three) times daily.     . ferrous sulfate 325 (65 FE) MG tablet Take 325 mg by mouth 2 (two) times daily.     . furosemide (LASIX) 80 MG tablet Take 80 mg by mouth.    . gabapentin (NEURONTIN) 600 MG tablet Take 600 mg by mouth 2 (two) times daily.    Marland Kitchen guaiFENesin-codeine (ROBITUSSIN AC) 100-10 MG/5ML syrup Take 15 mLs by mouth every 6 (six) hours as needed for cough.    . hydrochlorothiazide (MICROZIDE) 12.5 MG capsule Take 12.5 mg by mouth daily with supper.    Marland Kitchen HYDROcodone-acetaminophen (NORCO) 7.5-325 MG tablet Take 1 tablet by mouth every 6 (six) hours as needed for moderate pain. 120 tablet 0  . insulin glargine (LANTUS) 100 UNIT/ML injection Inject 50 Units into the skin every evening.     . insulin lispro (HUMALOG) 100 UNIT/ML injection Inject 8 Units into the skin 3 (three) times daily before meals.     . lactulose (CHRONULAC) 10 GM/15ML solution Take 10 g by mouth daily. (0800)    . lamoTRIgine (LAMICTAL) 100 MG tablet Take 1 tablet (100 mg total) by mouth 2 (two) times daily. (0800 & 2000) 180 tablet 1  . lamoTRIgine (LAMICTAL) 25 MG tablet Take 1 tablet (25 mg total) by mouth daily. To be added to the Lamictal 100 mg in the AM 90 tablet 1  . lidocaine (LIDODERM) 5 % Place 2 patches onto the skin  daily. Apply 1 patch to 2 areas on back (2 patches) every morning and remove at bedtime.    . liraglutide (VICTOZA) 18 MG/3ML SOPN Inject 1.8 mg into the skin daily.    Marland Kitchen LYRICA 150 MG capsule Take 150 mg by mouth 3 (three) times daily. (0800, 1400 & 2000)    . metFORMIN (GLUCOPHAGE-XR) 500 MG 24 hr tablet Take 500 mg by mouth twice daily.    . metoCLOPramide (REGLAN) 5 MG tablet Take 5 mg by mouth 4 (four) times daily -  before meals and at bedtime. (0800, 1200, 1600 & 2000)    . mirtazapine (REMERON) 30 MG tablet Take 1.5 tablets (45 mg total) by mouth at bedtime. 135 tablet 1  . neomycin-bacitracin-polymyxin (NEOSPORIN) [phone] ointment Apply 1 application topically every 12 (twelve) hours as needed (to right forearm).    . nystatin (MYCOSTATIN/NYSTOP) powder Apply topically 4 (four) times daily. (Patient taking differently: Apply topically 4 (four) times daily. Apply to inner thigh/ peri area twice daily and apply to abdomen 4 times daily.) 15 g 0  . omeprazole (PRILOSEC) 20 MG capsule Take 20 mg by mouth daily.     . ondansetron (ZOFRAN) 4 MG tablet Take 4 mg by mouth every 4 (four) hours as needed for nausea or vomiting.    Marland Kitchen oxybutynin (DITROPAN-XL) 5 MG 24 hr tablet Take 5 mg by mouth daily. (0800)    . rOPINIRole (REQUIP) 3 MG tablet Take 3 mg by mouth at bedtime. (2000)    . senna (SENOKOT) 8.6 MG tablet Take 2 tablets by mouth at bedtime.    . simvastatin (ZOCOR) 10 MG tablet Take 10 mg by mouth at bedtime. (2000)    .  spironolactone (ALDACTONE) 50 MG tablet Take 50 mg by mouth 2 (two) times daily. (0800)    . vitamin C (ASCORBIC ACID) 500 MG tablet Take 500 mg by mouth 2 (two) times daily. (0800 & 2000)    . zolpidem (AMBIEN) 5 MG tablet Take 5 mg by mouth at bedtime. (2000)    . potassium chloride (K-DUR,KLOR-CON) 10 MEQ tablet Take 1 tablet (10 mEq total) by mouth daily for 4 days. 4 tablet 0   No current facility-administered medications for this visit.     Review of Systems   Constitutional: Positive for malaise/fatigue. Negative for diaphoresis, fever and weight loss (up 2 pounds).  HENT: Negative.  Negative for ear pain, nosebleeds and sore throat.   Eyes: Negative for double vision, pain and discharge.  Respiratory: Positive for shortness of breath (exertional). Negative for cough, hemoptysis and sputum production.   Cardiovascular: Negative for chest pain, palpitations, orthopnea, leg swelling and PND.  Gastrointestinal: Negative for abdominal pain, blood in stool, constipation, diarrhea, melena, nausea and vomiting.  Genitourinary: Negative for dysuria, frequency, hematuria and urgency.  Musculoskeletal: Positive for joint pain. Negative for back pain, falls and myalgias.  Skin: Negative for itching and rash.  Neurological: Negative for dizziness, tremors, weakness and headaches.  Endo/Heme/Allergies: Bruises/bleeds easily.       Diabetes  Psychiatric/Behavioral: Negative for depression, memory loss and suicidal ideas. The patient is nervous/anxious and has insomnia (related to pain ).   All other systems reviewed and are negative.  Performance status (ECOG): 2 - Symptomatic, <50% confined to bed  Physical Exam: Blood pressure 100/69, pulse 89, temperature (!) 95.7 F (35.4 C), temperature source Tympanic, resp. rate 18, weight 229 lb 2 oz (103.9 kg), SpO2 94 %. GENERAL:  Well developed, well nourished, heavyset woman sitting comfortably in the exam room in no acute distress.  She has a rolling walker by her side. MENTAL STATUS:  Alert and oriented to person, place and time. HEAD:  Short gray hair.  Normocephalic, atraumatic, face symmetric, no Cushingoid features. EYES:  Blue eyes s/p cataract surgery.  Pupils equal round and reactive to light and accomodation.  No conjunctivitis or scleral icterus. ENT:  Oropharynx clear without lesion.  Tongue normal.  Dentures.  Mucous membranes moist.  RESPIRATORY:  Clear to auscultation without rales, wheezes or  rhonchi. CARDIOVASCULAR:  Regular rate and rhythm without murmur, rub or gallop. ABDOMEN:  Fully round.  Soft, non-tender, with active bowel sounds, and no appreciable hepatosplenomegaly.  No masses. SKIN:  No rashes, ulcers or lesions. EXTREMITIES: Chronic lower extremity edema.  Right knee warm to touch.  No palpable cords. LYMPH NODES: No palpable cervical, supraclavicular, axillary or inguinal adenopathy  NEUROLOGICAL: Unremarkable. PSYCH:  Appropriate.   [facility] Labs: 09/19/2016:  Hematocrit 39.0, hemoglobin 12.6, MCV 94, platelets are 110,000, white count 9000 with an ANC of 7200. Comprehensive metabolic panel included an alkaline phosphatase of 213. Bilirubin, SGOT and SGPT were normal.  Creatinine was normal. Iron studies included a ferritin of 136, iron saturation of 28% and a TIBC of 310. 04/05/2017:  Hematocrit 39.7, hemoglobin 13.3, MCV 96, platelets are 107,000, white count 5700 with an ANC of 4200. Comprehensive metabolic panel included an alkaline phosphatase of 225. Bilirubin, SGOT and SGPT were normal.  Creatinine was 0.78.  Ferritin was  214. PT was 10.9 with an INR of 1.1. 05/19/2017:  Hematocrit of 37.5, hemoglobin 12.4, MCV 96, platelets 117,000, WBC 5500 with an ANC of 3900.  LFTS revealed an albumen of 3.5, bilirubin  0.6, alkaline phosphatase of 219 (39-117), SGOT 25, SPGT 20.  PT was 10.8 (INR 1.0).  AFP was 4.6.  Ferritin was 329. 07/28/2017:  Alkaline phosphatase 176 (improved). 10/26/2017: Hematocrit 38.0, hemoglobin 13.2, MCV 93, platelets 115,000, white count 6400 with an ANC of 4900.  LFTs revealed a albumin of 4.0, SGOT 27, SGPT 20, alkaline phosphatase 205 and bilirubin 0.6.  PT is 10.7 (INR 1.0).  AFP is 5.1.   Admission on 11/05/2017, Discharged on 11/05/2017  Component Date Value Ref Range Status  . Sodium 11/05/2017 137  135 - 145 mmol/L Final  . Potassium 11/05/2017 3.7  3.5 - 5.1 mmol/L Final  . Chloride 11/05/2017 101  101 - 111 mmol/L Final  . CO2  11/05/2017 27  22 - 32 mmol/L Final  . Glucose, Bld 11/05/2017 139* 65 - 99 mg/dL Final  . BUN 11/05/2017 25* 6 - 20 mg/dL Final  . Creatinine, Ser 11/05/2017 0.92  0.44 - 1.00 mg/dL Final  . Calcium 11/05/2017 9.1  8.9 - 10.3 mg/dL Final  . Total Protein 11/05/2017 6.9  6.5 - 8.1 g/dL Final  . Albumin 11/05/2017 3.5  3.5 - 5.0 g/dL Final  . AST 11/05/2017 27  15 - 41 U/L Final  . ALT 11/05/2017 23  14 - 54 U/L Final  . Alkaline Phosphatase 11/05/2017 166* 38 - 126 U/L Final  . Total Bilirubin 11/05/2017 0.9  0.3 - 1.2 mg/dL Final  . GFR calc non Af Amer 11/05/2017 >60  >60 mL/min Final  . GFR calc Af Amer 11/05/2017 >60  >60 mL/min Final   Comment: (NOTE) The eGFR has been calculated using the CKD EPI equation. This calculation has not been validated in all clinical situations. eGFR's persistently <60 mL/min signify possible Chronic Kidney Disease.   Georgiann Hahn gap 11/05/2017 9  5 - 15 Final   Performed at [facility], [address] [place], [place] [address]  . WBC 11/05/2017 7.6  3.6 - 11.0 K/uL Final  . RBC 11/05/2017 3.78* 3.80 - 5.20 MIL/uL Final  . Hemoglobin 11/05/2017 12.1  12.0 - 16.0 g/dL Final  . HCT 11/05/2017 35.3  35.0 - 47.0 % Final  . MCV 11/05/2017 93.5  80.0 - 100.0 fL Final  . MCH 11/05/2017 31.9  26.0 - 34.0 pg Final  . MCHC 11/05/2017 34.1  32.0 - 36.0 g/dL Final  . RDW 11/05/2017 15.8* 11.5 - 14.5 % Final  . Platelets 11/05/2017 119* 150 - 440 K/uL Final  . Neutrophils Relative % 11/05/2017 76  % Final  . Neutro Abs 11/05/2017 5.7  1.4 - 6.5 K/uL Final  . Lymphocytes Relative 11/05/2017 11  % Final  . Lymphs Abs 11/05/2017 0.9* 1.0 - 3.6 K/uL Final  . Monocytes Relative 11/05/2017 11  % Final  . Monocytes Absolute 11/05/2017 0.8  0.2 - 0.9 K/uL Final  . Eosinophils Relative 11/05/2017 2  % Final  . Eosinophils Absolute 11/05/2017 0.1  0 - 0.7 K/uL Final  . Basophils Relative 11/05/2017 0  % Final  . Basophils Absolute 11/05/2017 0.0  0 - 0.1  K/uL Final   Performed at [facility], [address] [place], [place] [address]  . Troponin I 11/05/2017 <0.03  <0.03 ng/mL Final   Performed at [facility], [address] [place], [place] [address]  . Color, Urine 11/05/2017 YELLOW* YELLOW Final  . APPearance 11/05/2017 CLEAR* CLEAR Final  . Specific Gravity, Urine 11/05/2017 1.011  1.005 - 1.030 Final  . pH  11/05/2017 5.0  5.0 - 8.0 Final  . Glucose, UA 11/05/2017 NEGATIVE  NEGATIVE mg/dL Final  . Hgb urine dipstick 11/05/2017 NEGATIVE  NEGATIVE Final  . Bilirubin Urine 11/05/2017 NEGATIVE  NEGATIVE Final  . Ketones, ur 11/05/2017 NEGATIVE  NEGATIVE mg/dL Final  . Protein, ur 11/05/2017 NEGATIVE  NEGATIVE mg/dL Final  . Nitrite 11/05/2017 NEGATIVE  NEGATIVE Final  . Leukocytes, UA 11/05/2017 NEGATIVE  NEGATIVE Final  . RBC / HPF 11/05/2017 0-5  0 - 5 RBC/hpf Final  . WBC, UA 11/05/2017 0-5  0 - 5 WBC/hpf Final  . Bacteria, UA 11/05/2017 NONE SEEN  NONE SEEN Final  . Squamous Epithelial / LPF 11/05/2017 0-5  0 - 5 Final  . Hyaline Casts, UA 11/05/2017 PRESENT   Final   Performed at [facility], [address] [place], [place] [address]    Assessment:  Stephanie [name] is a 67 y.o. 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 Platelets have ranged between 108,000 and 128,000 without trend. She denies any new medications or herbal products.  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  Work-up on 08/29/2014 revealed a positive rheumatoid factor and ANA (anti-double-stranded DNA of 5- equivocal).  Negative studies included hepatitis B surface antibody and antigen, hepatitis C testing, and HIV testing. B12, SPEP, and free light chains were normal.  Additional testing on 10/31/2014 revealed negative hepatitis B core antibody and CMV IgM.  Abdominal ultrasound on 06/27/2014 revealed mild splenomegaly (14.2 cm) with moderate  cirrhosis and no focal liver lesions. RUQ ultrasound on 09/29/2016 revealed a nodular liver (cirrhotic) without focal mass.   Abdominal ultrasound on 05/24/2017 revealed hepatic cirrhosis with mild splenomegaly (15.5 cm).  She is on lactulose 3/day.  Chest, abdomen, and pelvic CT on 08/15/2017 revealed no active pulmonary disease, morphologic appearance of cirrhosis with stable splenomegaly. There were small varices in left upper quadrant.  She has a neural stimulator device.  AFP has been followed: 4 on 04/21/2015, 4.6 on 05/19/2017, and 5.1 on 10/26/2017.  She denies any alcohol use.  She has a history of recurrent iron deficiency anemia.  Ferritin was 20 on 05/11/2015 and 15 on 11/13/2015. Hematocrit was 29.7 on 11/13/2015.  Last colonoscopy was in 08/2014.  Guaiac cards were negative x 3 in 11/2015.  Diet is good.  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  She is s/p arthroscopic right knee surgery on 11/15/2016.  She is s/p left femur fracture on 03/07/2017.  Alkaline phosphatase is elevated.  She was admitted to [facility] from 07/19/2017 - 07/22/2017 with a group B strept UTI and altered mental status.  Alkaline phosphatase was 143 on 07/19/2017.  Platelet count was 108,000 - 124,000.  She was treated with Augmentin.    Symptomatically, she is doing well overall. She has continued pain in her RIGHT knee.  She notes chronic bruising.  Exam reveals no petechiae.   Plan: 1.  Review interval [facility] labs. 2.  Discuss ER evaluation s/p falls.   3.  Discuss prior evaluation by GI for cirrhosis. Refer back to [place].  4.  Schedule abdominal US on 02/15/2018. 5.  Complete paperwork for [facility]. 6.  RTC in 3 months for MD assessment and  Review of [facility] labs (CBC with diff, CMP, AFP, PT/INR).   Honor Loh, NP 11/07/2017, 11:05 AM   I saw and evaluated the patient, participating in the key portions of the service and reviewing  pertinent diagnostic studies and records.   I reviewed the nurse practitioner's note and agree with the findings and the plan.  The assessment and plan were discussed with the patient.  Several questions were asked by the patient and answered.   Nolon Stalls, MD 11/07/2017,11:05 AM

## 2017-11-07 NOTE — Progress Notes (Signed)
Patient offers no complaints today.  She did start with the pain clinic.

## 2017-11-13 ENCOUNTER — Ambulatory Visit
Admission: RE | Admit: 2017-11-13 | Discharge: 2017-11-13 | Disposition: A | Discharge status: Home / Self Care | Payer: Medicare Other | Source: Ambulatory Visit | Attending: Nurse Practitioner | Admitting: Nurse Practitioner

## 2017-11-13 DIAGNOSIS — M25561 Pain in right knee: Secondary | ICD-10-CM | POA: Diagnosis present

## 2017-11-13 DIAGNOSIS — G8929 Other chronic pain: Secondary | ICD-10-CM | POA: Insufficient documentation

## 2017-11-13 DIAGNOSIS — M1711 Unilateral primary osteoarthritis, right knee: Secondary | ICD-10-CM | POA: Diagnosis not present

## 2017-11-13 DIAGNOSIS — X58XXXA Exposure to other specified factors, initial encounter: Secondary | ICD-10-CM | POA: Diagnosis not present

## 2017-11-13 DIAGNOSIS — S83241A Other tear of medial meniscus, current injury, right knee, initial encounter: Secondary | ICD-10-CM | POA: Diagnosis not present

## 2017-11-16 ENCOUNTER — Ambulatory Visit
Payer: Medicare Other | Attending: Student in an Organized Health Care Education/Training Program | Admitting: Student in an Organized Health Care Education/Training Program

## 2017-11-16 ENCOUNTER — Encounter: Payer: Self-pay | Admitting: Student in an Organized Health Care Education/Training Program

## 2017-11-16 ENCOUNTER — Other Ambulatory Visit: Payer: Self-pay

## 2017-11-16 VITALS — BP 92/67 | HR 87 | Temp 98.3°F | Resp 18 | Ht 63.0 in | Wt 220.0 lb

## 2017-11-16 DIAGNOSIS — F319 Bipolar disorder, unspecified: Secondary | ICD-10-CM | POA: Insufficient documentation

## 2017-11-16 DIAGNOSIS — E119 Type 2 diabetes mellitus without complications: Secondary | ICD-10-CM | POA: Diagnosis not present

## 2017-11-16 DIAGNOSIS — Z794 Long term (current) use of insulin: Secondary | ICD-10-CM | POA: Diagnosis not present

## 2017-11-16 DIAGNOSIS — M79605 Pain in left leg: Secondary | ICD-10-CM | POA: Diagnosis not present

## 2017-11-16 DIAGNOSIS — G8929 Other chronic pain: Secondary | ICD-10-CM

## 2017-11-16 DIAGNOSIS — M79662 Pain in left lower leg: Secondary | ICD-10-CM | POA: Insufficient documentation

## 2017-11-16 DIAGNOSIS — M25561 Pain in right knee: Secondary | ICD-10-CM | POA: Diagnosis not present

## 2017-11-16 DIAGNOSIS — Z9889 Other specified postprocedural states: Secondary | ICD-10-CM | POA: Insufficient documentation

## 2017-11-16 DIAGNOSIS — G4733 Obstructive sleep apnea (adult) (pediatric): Secondary | ICD-10-CM | POA: Diagnosis not present

## 2017-11-16 DIAGNOSIS — Z8673 Personal history of transient ischemic attack (TIA), and cerebral infarction without residual deficits: Secondary | ICD-10-CM | POA: Insufficient documentation

## 2017-11-16 DIAGNOSIS — M170 Bilateral primary osteoarthritis of knee: Secondary | ICD-10-CM | POA: Diagnosis not present

## 2017-11-16 DIAGNOSIS — M797 Fibromyalgia: Secondary | ICD-10-CM | POA: Diagnosis not present

## 2017-11-16 DIAGNOSIS — Z9049 Acquired absence of other specified parts of digestive tract: Secondary | ICD-10-CM | POA: Insufficient documentation

## 2017-11-16 DIAGNOSIS — Z87891 Personal history of nicotine dependence: Secondary | ICD-10-CM | POA: Insufficient documentation

## 2017-11-16 DIAGNOSIS — Z90710 Acquired absence of both cervix and uterus: Secondary | ICD-10-CM | POA: Diagnosis not present

## 2017-11-16 DIAGNOSIS — Z0289 Encounter for other administrative examinations: Secondary | ICD-10-CM | POA: Diagnosis not present

## 2017-11-16 DIAGNOSIS — Z79899 Other long term (current) drug therapy: Secondary | ICD-10-CM | POA: Diagnosis not present

## 2017-11-16 DIAGNOSIS — G894 Chronic pain syndrome: Secondary | ICD-10-CM | POA: Diagnosis not present

## 2017-11-16 DIAGNOSIS — Z6838 Body mass index (BMI) 38.0-38.9, adult: Secondary | ICD-10-CM | POA: Diagnosis not present

## 2017-11-16 DIAGNOSIS — Z5181 Encounter for therapeutic drug level monitoring: Secondary | ICD-10-CM | POA: Diagnosis present

## 2017-11-16 DIAGNOSIS — Z981 Arthrodesis status: Secondary | ICD-10-CM

## 2017-11-16 DIAGNOSIS — M1711 Unilateral primary osteoarthritis, right knee: Secondary | ICD-10-CM | POA: Diagnosis not present

## 2017-11-16 MED ORDER — ORPHENADRINE CITRATE 30 MG/ML IJ SOLN
30.0000 mg | Freq: Once | INTRAMUSCULAR | Status: AC
  Administered 2017-11-16: 30 mg via INTRAMUSCULAR

## 2017-11-16 MED ORDER — ORPHENADRINE CITRATE 30 MG/ML IJ SOLN
INTRAMUSCULAR | Status: AC
  Filled 2017-11-16: qty 2

## 2017-11-16 MED ORDER — HYDROCODONE-ACETAMINOPHEN 7.5-325 MG PO TABS: 1.0000 | ORAL_TABLET | Freq: Four times a day (QID) | ORAL | 0 refills | Status: DC | PRN

## 2017-11-16 MED ORDER — KETOROLAC TROMETHAMINE 30 MG/ML IJ SOLN
30.0000 mg | Freq: Once | INTRAMUSCULAR | Status: AC
  Administered 2017-11-16: 30 mg via INTRAMUSCULAR

## 2017-11-16 MED ORDER — KETOROLAC TROMETHAMINE 30 MG/ML IJ SOLN
INTRAMUSCULAR | Status: AC
  Filled 2017-11-16: qty 1

## 2017-11-16 NOTE — Progress Notes (Signed)
Patient's Name: Stephanie [name]  MRN: [id number]  Referring Provider: Sarajane Jews, MD  DOB: April 23, 1951  PCP: Sarajane Jews, MD  DOS: 11/16/2017  Note by: Gillis Santa, MD  Service setting: Ambulatory outpatient  Specialty: Interventional Pain Management  Location: [facility] (AMB) Pain Management Facility    Patient type: Established   Primary Reason(s) for Visit: Encounter for prescription drug management. (Level of risk: moderate)  CC: Leg Pain (entire right leg, left lower leg)  HPI  Stephanie [name] is a 67 y.o. year old, female patient, who comes today for a medication management evaluation. She has UNSPECIFIED VITAMIN D DEFICIENCY; HLD (hyperlipidemia); TOBACCO ABUSE; OSA (obstructive sleep apnea); RESTLESS LEGS SYNDROME; Essential hypertension; Asthma; BACK PAIN, LUMBAR, CHRONIC; INSOMNIA; UNS ADVRS EFF OTH RX MEDICINAL&BIOLOGICAL SBSTNC; BIPOLAR AFFECTIVE DISORDER, HX OF; Aortic dissection ([place]); Anxiety; Lower extremity edema; Cirrhosis of liver ([place]); Varicose veins of left lower extremity; GERD (gastroesophageal reflux disease); Dyspnea; Lung nodule; DNR (do not resuscitate); Encounter for routine gynecological examination; Poorly controlled type 2 diabetes mellitus with complication ([place]); Dizziness and giddiness; DOE (dyspnea on exertion); Liver cirrhosis secondary to NASH ([place]); Abdominal pain, chronic, epigastric; Thrombocytopenia ([place]); Neuropathy; Hyperglycemia; Hypoglycemia; Iron deficiency anemia; Chest pain; Pneumonia; Pain in limb; Swelling of limb; Postphlebitic syndrome with inflammation; Lymphedema; Encephalopathy; Bilateral carotid artery stenosis; Degenerative tear of lateral meniscus of right knee; Degenerative tear of medial meniscus of right knee; Diabetes mellitus type 1, uncontrolled, without complications ([place]); Primary osteoarthritis of right knee; Altered mental status; Elevated alkaline phosphatase level; Sepsis ([place]); Cervical stenosis of spine; Obesity, Class III, BMI 40-49.9 (morbid  obesity) ([place]); Stress fracture of femur; and Tricompartment osteoarthritis of right knee on their problem list. Her primarily concern today is the Leg Pain (entire right leg, left lower leg)  Pain Assessment: Location: Right, Left Leg Radiating: denies Onset: More than a month ago Duration: Chronic pain Quality: Burning, Stabbing Severity: 8 /10 (subjective, self-reported pain score)  Note: Reported level is inconsistent with clinical observations. Clinically the patient looks like a 3/10 A 3/10 is viewed as "Moderate" and described as significantly interfering with activities of daily living (ADL). It becomes difficult to feed, bathe, get dressed, get on and off the toilet or to perform personal hygiene functions. Difficult to get in and out of bed or a chair without assistance. Very distracting. With effort, it can be ignored when deeply involved in activities. Information on the proper use of the pain scale provided to the patient today. When using our objective Pain Scale, levels between 6 and 10/10 are said to belong in an emergency room, as it progressively worsens from a 6/10, described as severely limiting, requiring emergency care not usually available at an outpatient pain management facility. At a 6/10 level, communication becomes difficult and requires great effort. Assistance to reach the emergency department may be required. Facial flushing and profuse sweating along with potentially dangerous increases in heart rate and blood pressure will be evident. Effect on ADL:   Timing: Constant Modifying factors: nothing BP: 92/67  HR: 87  Stephanie [name] was last scheduled for an appointment on 11/01/2017 for medication management. During today's appointment we reviewed Ms. Pagliuca's chronic pain status, as well as her outpatient medication regimen.  Patient presents today for follow-up.  She was last seen by our nurse practitioner, Dionisio David.  At that time patient was endorsing severe pain in  her right knee.  An MRI of her right knee was obtained with results below.  At the last visit, patient's hydrocodone  was also increased from 7.5 mg 3 times daily to 4 times daily as needed given worsening of her chronic knee and low back pain.  Patient has significant difficulty standing or walking for an extended period of time without developing lower externally weakness and trembling.  Since the patient's last visit, she has also presented to the ED given a fall.  This was a mechanical fall where she tripped over her wheelchair.  Of note patient has tried right knee genicular nerve block with local anesthetic only but without any significant benefit in her right knee pain.  Patient's PCP recommended that steroids not be used for the block given her type 2 diabetes on insulin.  The patient  reports that she does not use drugs. Her body mass index is 38.97 kg/m.  Further details on both, my assessment(s), as well as the proposed treatment plan, please see below.  Controlled Substance Pharmacotherapy Assessment REMS (Risk Evaluation and Mitigation Strategy)  Analgesic: Hydrocodone 7.5 mg 4 times daily as needed, quantity 65-monthMME/day: 30 mg/day.  WLandis Martins RN  11/16/2017 10:41 AM  Sign at close encounter Nursing Pain Medication Assessment:  Safety precautions to be maintained throughout the outpatient stay will include: orient to surroundings, keep bed in low position, maintain call bell within reach at all times, provide assistance with transfer out of bed and ambulation.  Medication Inspection Compliance: Ms. RThatchdid not comply with our request to bring her pills to be counted. She was reminded that bringing the medication bottles, even when empty, is a requirement.  Medication: None brought in. Pill/Patch Count: None available to be counted. Bottle Appearance: No container available. Did not bring bottle(s) to appointment. Filled Date: N/A Last Medication intake:  Today   Patient lives in a facility, they given her meds to her   Pharmacokinetics: Liberation and absorption (onset of action): WNL Distribution (time to peak effect): WNL Metabolism and excretion (duration of action): WNL         Pharmacodynamics: Desired effects: Analgesia: Ms. RKoskareports >50% benefit. Functional ability: Patient reports that medication allows her to accomplish basic ADLs Clinically meaningful improvement in function (CMIF): Sustained CMIF goals met Perceived effectiveness: Described as relatively effective, allowing for increase in activities of daily living (ADL) Undesirable effects: Side-effects or Adverse reactions: None reported Monitoring: [place] PMP: Online review of the past 156-montheriod conducted. Compliant with practice rules and regulations Last UDS on record: Summary  Date Value Ref Range Status  02/02/2017 FINAL  Final    Comment:    ==================================================================== TOXASSURE COMP DRUG ANALYSIS,UR ==================================================================== Test                             Result       Flag       Units Drug Present and Declared for Prescription Verification   7-aminoclonazepam              330          EXPECTED   ng/mg creat    7-aminoclonazepam is an expected metabolite of clonazepam. Source    of clonazepam is a scheduled prescription medication.   Tramadol                       11726        EXPECTED   ng/mg creat   O-Desmethyltramadol            3096  EXPECTED   ng/mg creat   N-Desmethyltramadol            7048         EXPECTED   ng/mg creat    Source of tramadol is a prescription medication.    O-desmethyltramadol and N-desmethyltramadol are expected    metabolites of tramadol.   Gabapentin                     PRESENT      EXPECTED   Pregabalin                     PRESENT      EXPECTED   Lamotrigine                    PRESENT      EXPECTED   Bupropion                       PRESENT      EXPECTED   Hydroxybupropion               PRESENT      EXPECTED    Hydroxybupropion is an expected metabolite of bupropion.   Mirtazapine                    PRESENT      EXPECTED   Acetaminophen                  PRESENT      EXPECTED   Lidocaine                      PRESENT      EXPECTED Drug Present not Declared for Prescription Verification   Alcohol, Ethyl                 0.124        UNEXPECTED g/dL    Sources of ethyl alcohol include alcoholic beverages or as a    fermentation product of glucose; glucose was not detected in this    specimen. Ethyl alcohol result should be interpreted in the    context of all available clinical and behavioral information.   Ibuprofen                      PRESENT      UNEXPECTED Drug Absent but Declared for Prescription Verification   Oxycodone                      Not Detected UNEXPECTED ng/mg creat   Tizanidine                     Not Detected UNEXPECTED    Tizanidine, as indicated in the declared medication list, is not    always detected even when used as directed.   Zolpidem                       Not Detected UNEXPECTED    Zolpidem, as indicated in the declared medication list, is not    always detected even when used as directed.   Diclofenac                     Not Detected UNEXPECTED    Diclofenac, as indicated in the declared medication list, is not    always detected even when used as  directed.   Diphenhydramine                Not Detected UNEXPECTED   Guaifenesin                    Not Detected UNEXPECTED ==================================================================== Test                      Result    Flag   Units      Ref Range   Creatinine              27               mg/dL      >=20 ==================================================================== Declared Medications:  The flagging and interpretation on this report are based on the  following declared medications.  Unexpected results may arise from   inaccuracies in the declared medications.  **Note: The testing scope of this panel includes these medications:  Bupropion (Wellbutrin)  Clonazepam (Klonopin)  Diphenhydramine (Benadryl)  Gabapentin (Neurontin)  Guaifenesin  Lamotrigine (Lamictal)  Mirtazapine (Remeron)  Oxycodone (Oxy IR)  Oxycodone (Roxicodone)  Pregabalin (Lyrica)  Tramadol (Ultram)  **Note: The testing scope of this panel does not include small to  moderate amounts of these reported medications:  Acetaminophen (Tylenol)  Diclofenac (Voltaren)  Lidocaine (Lidoderm)  Tizanidine (Zanaflex)  Zolpidem (Ambien)  **Note: The testing scope of this panel does not include following  reported medications:  Albuterol (Proventil)  Albuterol (Ventolin HFA)  Budenoside (Symbicort)  Dicyclomine (Bentyl)  Formoterol (Symbicort)  Furosemide (Lasix)  Insulin (Humalog)  Insulin (Lantus)  Iron (Ferrous Sulfate)  Lactulose  Loperamide (Imodium)  Magnesium (Milk of Magnesia)  Metoclopramide (Reglan)  Omeprazole (Prilosec)  Ondansetron (Zofran)  Oxybutynin (Ditropan)  Potassium (K-Dur)  Potassium (Klor-Con)  Ropinirole  Simvastatin (Zocor)  Spironolactone (Aldactone)  Topical  Vitamin C ==================================================================== For clinical consultation, please call [phone]. ====================================================================    UDS interpretation: Compliant          Medication Assessment Form: Reviewed. Patient indicates being compliant with therapy Treatment compliance: Compliant Risk Assessment Profile: Aberrant behavior: See prior evaluations. None observed or detected today Comorbid factors increasing risk of overdose: See prior notes. No additional risks detected today Risk of substance use disorder (SUD): Low Opioid Risk Tool - 10/18/17 0928      Family History of Substance Abuse   Alcohol  Positive Female    Illegal Drugs  Negative    Rx Drugs   Negative      Personal History of Substance Abuse   Alcohol  Negative    Illegal Drugs  Negative    Rx Drugs  Negative      Age   Age between 32-45 years   No      History of Preadolescent Sexual Abuse   History of Preadolescent Sexual Abuse  Negative or Female      Psychological Disease   Psychological Disease  Negative    Depression  Positive      Total Score   Opioid Risk Tool Scoring  2    Opioid Risk Interpretation  Low Risk      ORT Scoring interpretation table:  Score <3 = Low Risk for SUD  Score between 4-7 = Moderate Risk for SUD  Score >8 = High Risk for Opioid Abuse   Risk Mitigation Strategies:  Patient Counseling: Covered Patient-Prescriber Agreement (PPA): Present and active  Notification to other healthcare providers: Done  Pharmacologic Plan: No change in therapy, at  this time.             Laboratory Chemistry  Inflammation Markers (CRP: Acute Phase) (ESR: Chronic Phase) Lab Results  Component Value Date   ESRSEDRATE 71 (H) 04/20/2016   LATICACIDVEN 1.1 07/20/2017                         Rheumatology Markers Lab Results  Component Value Date   ANA POS (A) 03/19/2012                        Renal Function Markers Lab Results  Component Value Date   BUN 25 (H) 11/05/2017   CREATININE 0.92 11/05/2017   GFRAA >60 11/05/2017   GFRNONAA >60 11/05/2017                             Hepatic Function Markers Lab Results  Component Value Date   AST 27 11/05/2017   ALT 23 11/05/2017   ALBUMIN 3.5 11/05/2017   ALKPHOS 166 (H) 11/05/2017   HCVAB NEGATIVE 02/03/2012   AMYLASE 30 06/12/2014   LIPASE 17 12/09/2015   AMMONIA 45 (H) 07/21/2017                        Electrolytes Lab Results  Component Value Date   NA 137 11/05/2017   K 3.7 11/05/2017   CL 101 11/05/2017   CALCIUM 9.1 11/05/2017   MG 2.1 07/21/2017   PHOS 3.7 07/21/2017                        Neuropathy Markers Lab Results  Component Value Date   VITAMINB12 431 11/13/2015    FOLATE 29.0 11/13/2015   HGBA1C 5.2 07/20/2016                        Bone Pathology Markers Lab Results  Component Value Date   VD25OH 30 11/12/2012   VD125OH2TOT 10 (L) 06/19/2008                         Coagulation Parameters Lab Results  Component Value Date   INR 1.00 03/07/2017   LABPROT 13.1 03/07/2017   APTT 37 (H) 03/07/2017   PLT 119 (L) 11/05/2017                        Cardiovascular Markers Lab Results  Component Value Date   BNP 108.0 (H) 07/28/2016   CKTOTAL 119 06/30/2012   CKMB 0.6 06/30/2012   TROPONINI <0.03 11/05/2017   HGB 12.1 11/05/2017   HCT 35.3 11/05/2017                         CA Markers No results found for: CEA, CA125, LABCA2                      Note: Lab results reviewed.  Recent Diagnostic Imaging Results  MR KNEE RIGHT WO CONTRAST CLINICAL DATA:  Chronic right knee pain. The patient underwent arthroscopic surgery in June, 2018. The patient reports some recent falls but no specific knee injury.  EXAM: MRI OF THE RIGHT KNEE WITHOUT CONTRAST  TECHNIQUE: Multiplanar, multisequence MR imaging of the knee was performed. No intravenous contrast was administered.  COMPARISON:  MRI right knee 05/23/2016. Plain films of the right knee 11/05/2017 and 09/13/2017.  FINDINGS: MENISCI  Medial meniscus: Horizontal tear in the posterior horn reaches the meniscal undersurface. The body is degenerated and diminutive.  Lateral meniscus: There is some blunting along the free edge of the posterior horn which may be due to fraying or debridement. No focal tear.  LIGAMENTS  Cruciates:  Intact.  Collaterals:  Intact.  CARTILAGE  Patellofemoral: Marked cartilage loss along the lateral femoral trochlea and lateral patellar facet persists. Associated subchondral edema is again seen but appears improved, particularly in the patella.  Medial:  Mildly thinned without focal defect, unchanged.  Lateral: Cartilage thinning and  irregularity about the posterior aspect of the lateral compartment have progressed since the prior MRI.  Joint:  Small effusion.  Popliteal Fossa:  No Baker's cyst.  Extensor Mechanism: Intact with postoperative change of lateral release noted.  Bones: No fracture or worrisome lesion. Osteophytosis about the knee has increased since the prior study.  Other: None.  IMPRESSION: Horizontal tear posterior horn medial meniscus.  Medial and lateral compartment osteophytosis has progressed since the prior examination. Marked patellofemoral degenerative disease is again seen although subchondral edema in the lateral patellar facet and femoral trochlea shows some improvement since the prior MRI.  Electronically Signed   By: Inge Rise M.D.   On: 11/13/2017 09:52  Complexity Note: Imaging results reviewed.                         Meds   Current Outpatient Medications:  .  acetaminophen (TYLENOL) 325 MG tablet, Take 650 mg by mouth every 6 (six) hours as needed for mild pain., Disp: , Rfl:  .  albuterol (PROVENTIL HFA;VENTOLIN HFA) 108 (90 Base) MCG/ACT inhaler, Inhale 2 puffs into the lungs every 6 (six) hours as needed for wheezing or shortness of breath., Disp: , Rfl:  .  albuterol (PROVENTIL) (2.5 MG/3ML) 0.083% nebulizer solution, Take 2.5 mg by nebulization every 6 (six) hours as needed for wheezing or shortness of breath., Disp: , Rfl:  .  ALPRAZolam (XANAX) 0.25 MG tablet, Take 0.25 mg by mouth 2 (two) times daily as needed for sleep., Disp: , Rfl:  .  budesonide-formoterol (SYMBICORT) 160-4.5 MCG/ACT inhaler, Inhale 2 puffs into the lungs 2 (two) times daily. , Disp: , Rfl:  .  buPROPion (WELLBUTRIN XL) 150 MG 24 hr tablet, Take 1 tablet (150 mg total) by mouth daily., Disp: 90 tablet, Rfl: 1 .  clonazePAM (KLONOPIN) 0.5 MG tablet, Take 0.5 mg by mouth 3 (three) times daily. , Disp: , Rfl:  .  Colloidal Oatmeal (EUCERIN ECZEMA RELIEF) 1 % CREA, Apply 1 application  topically 2 (two) times daily. Apply to right lower leg twice daily., Disp: , Rfl:  .  diclofenac sodium (VOLTAREN) 1 % GEL, Apply 2 g topically every 8 (eight) hours as needed (arthritis). Apply to right knee, Disp: , Rfl:  .  dicyclomine (BENTYL) 10 MG capsule, Take 10 mg by mouth 3 (three) times daily. , Disp: , Rfl:  .  ferrous sulfate 325 (65 FE) MG tablet, Take 325 mg by mouth 2 (two) times daily. , Disp: , Rfl:  .  furosemide (LASIX) 80 MG tablet, Take 80 mg by mouth., Disp: , Rfl:  .  gabapentin (NEURONTIN) 600 MG tablet, Take 600 mg by mouth 2 (two) times daily., Disp: , Rfl:  .  guaiFENesin-codeine (ROBITUSSIN AC) 100-10 MG/5ML syrup, Take 15 mLs by mouth  every 6 (six) hours as needed for cough., Disp: , Rfl:  .  hydrochlorothiazide (MICROZIDE) 12.5 MG capsule, Take 12.5 mg by mouth daily with supper., Disp: , Rfl:  .  HYDROcodone-acetaminophen (NORCO) 7.5-325 MG tablet, Take 1 tablet by mouth every 6 (six) hours as needed for moderate pain., Disp: 120 tablet, Rfl: 0 .  insulin glargine (LANTUS) 100 UNIT/ML injection, Inject 50 Units into the skin every evening. , Disp: , Rfl:  .  insulin lispro (HUMALOG) 100 UNIT/ML injection, Inject 8 Units into the skin 3 (three) times daily before meals. , Disp: , Rfl:  .  lactulose (CHRONULAC) 10 GM/15ML solution, Take 10 g by mouth daily. (0800), Disp: , Rfl:  .  lamoTRIgine (LAMICTAL) 100 MG tablet, Take 1 tablet (100 mg total) by mouth 2 (two) times daily. (0800 & 2000), Disp: 180 tablet, Rfl: 1 .  lamoTRIgine (LAMICTAL) 25 MG tablet, Take 1 tablet (25 mg total) by mouth daily. To be added to the Lamictal 100 mg in the AM, Disp: 90 tablet, Rfl: 1 .  lidocaine (LIDODERM) 5 %, Place 2 patches onto the skin daily. Apply 1 patch to 2 areas on back (2 patches) every morning and remove at bedtime., Disp: , Rfl:  .  liraglutide (VICTOZA) 18 MG/3ML SOPN, Inject 1.8 mg into the skin daily., Disp: , Rfl:  .  LYRICA 150 MG capsule, Take 150 mg by mouth 3  (three) times daily. (0800, 1400 & 2000), Disp: , Rfl:  .  metFORMIN (GLUCOPHAGE-XR) 500 MG 24 hr tablet, Take 500 mg by mouth twice daily., Disp: , Rfl:  .  metoCLOPramide (REGLAN) 5 MG tablet, Take 5 mg by mouth 4 (four) times daily -  before meals and at bedtime. (0800, 1200, 1600 & 2000), Disp: , Rfl:  .  mirtazapine (REMERON) 30 MG tablet, Take 1.5 tablets (45 mg total) by mouth at bedtime., Disp: 135 tablet, Rfl: 1 .  neomycin-bacitracin-polymyxin (NEOSPORIN) [id number] ointment, Apply 1 application topically every 12 (twelve) hours as needed (to right forearm)., Disp: , Rfl:  .  nystatin (MYCOSTATIN/NYSTOP) powder, Apply topically 4 (four) times daily. (Patient taking differently: Apply topically 4 (four) times daily. Apply to inner thigh/ peri area twice daily and apply to abdomen 4 times daily.), Disp: 15 g, Rfl: 0 .  omeprazole (PRILOSEC) 20 MG capsule, Take 20 mg by mouth daily. , Disp: , Rfl:  .  ondansetron (ZOFRAN) 4 MG tablet, Take 4 mg by mouth every 4 (four) hours as needed for nausea or vomiting., Disp: , Rfl:  .  oxybutynin (DITROPAN-XL) 5 MG 24 hr tablet, Take 5 mg by mouth daily. (0800), Disp: , Rfl:  .  rOPINIRole (REQUIP) 3 MG tablet, Take 3 mg by mouth at bedtime. (2000), Disp: , Rfl:  .  senna (SENOKOT) 8.6 MG tablet, Take 2 tablets by mouth at bedtime., Disp: , Rfl:  .  simvastatin (ZOCOR) 10 MG tablet, Take 10 mg by mouth at bedtime. (2000), Disp: , Rfl:  .  spironolactone (ALDACTONE) 50 MG tablet, Take 50 mg by mouth 2 (two) times daily. (0800), Disp: , Rfl:  .  vitamin C (ASCORBIC ACID) 500 MG tablet, Take 500 mg by mouth 2 (two) times daily. (0800 & 2000), Disp: , Rfl:  .  zolpidem (AMBIEN) 5 MG tablet, Take 5 mg by mouth at bedtime. (2000), Disp: , Rfl:  .  potassium chloride (K-DUR,KLOR-CON) 10 MEQ tablet, Take 1 tablet (10 mEq total) by mouth daily for 4 days., Disp: 4 tablet,  Rfl: 0  ROS  Constitutional: Denies any fever or chills Gastrointestinal: No reported  hemesis, hematochezia, vomiting, or acute GI distress Musculoskeletal: Denies any acute onset joint swelling, redness, loss of ROM, or weakness Neurological: No reported episodes of acute onset apraxia, aphasia, dysarthria, agnosia, amnesia, paralysis, loss of coordination, or loss of consciousness  Allergies  Ms. Grist is allergic to doxycycline.  [place]  Drug: Ms. Artist  reports that she does not use drugs. Alcohol:  reports that she does not drink alcohol. Tobacco:  reports that she quit smoking about 4 years ago. Her smoking use included cigarettes. She has a 75.00 pack-year smoking history. She has never used smokeless tobacco. Medical:  has a past medical history of Adenomatous colon polyp (08/27/2014), Anemia, Anxiety, Aortic dissection ([place]), Arthritis, Asthma, Bipolar affective disorder ([place]), Blood transfusion (2000), Blood transfusion without reported diagnosis, Cancer ([place]), Chronic abdominal pain, Cirrhosis ([place]), Colon polyp, Depression, Diabetes mellitus without complication ([place]), Fatty liver (04/09/08), GERD (gastroesophageal reflux disease), H/O: CVA (cardiovascular accident), H/O: rheumatic fever, Hepatitis, Hyperlipidemia, Incontinence, Insomnia, Lower extremity edema, Obstructive sleep apnea, Platelets decreased ([place]), RLS (restless legs syndrome), Shortness of breath, Sleep apnea, Stroke ([place]), Ulcer, and Unspecified disorders of nervous system. Surgical: Ms. Gillham  has a past surgical history that includes Cholecystectomy; Rotator cuff repair; Abdominal hysterectomy; Lumbar fusion (10/09); Hemorroidectomy; Abdominal exploration surgery; Posterior cervical fusion/foraminotomy; Lumbar wound debridement (05/09/2011); Axillary artery cannulation via 8-mm Hemashield graft, median sternotomy, extracorporeal circulation with deep hypothermic circulatory arrest, repair of  aortic dessection (01/23/2009); cataract; Colonoscopy; Liver biopsy (04/10/2012); Back surgery; Eye surgery  (Bilateral); and Knee arthroscopy with lateral release (Right, 11/15/2016). Family: family history includes Anxiety disorder in her brother; Breast cancer in her mother; Depression in her brother; Diabetes in her unknown relative; Lung cancer in her mother; Stomach cancer in her father.  Constitutional Exam  General appearance: alert, cooperative, slowed mentation and morbidly obese Vitals:   11/16/17 1039  BP: 92/67  Pulse: 87  Resp: 18  Temp: 98.3 F (36.8 C)  TempSrc: Oral  SpO2: 97%  Weight: 220 lb (99.8 kg)  Height: 5' 3"  (1.6 m)   BMI Assessment: Estimated body mass index is 38.97 kg/m as calculated from the following:   Height as of this encounter: 5' 3"  (1.6 m).   Weight as of this encounter: 220 lb (99.8 kg).  BMI interpretation table: BMI level Category Range association with higher incidence of chronic pain  <18 kg/m2 Underweight   18.5-24.9 kg/m2 Ideal body weight   25-29.9 kg/m2 Overweight Increased incidence by 20%  30-34.9 kg/m2 Obese (Class I) Increased incidence by 68%  35-39.9 kg/m2 Severe obesity (Class II) Increased incidence by 136%  >40 kg/m2 Extreme obesity (Class III) Increased incidence by 254%   Patient's current BMI Ideal Body weight  Body mass index is 38.97 kg/m. Ideal body weight: 52.4 kg (115 lb 8.3 oz) Adjusted ideal body weight: 71.4 kg (157 lb 5 oz)   BMI Readings from Last 4 Encounters:  11/16/17 38.97 kg/m  11/07/17 40.59 kg/m  11/05/17 39.86 kg/m  10/18/17 39.33 kg/m   Wt Readings from Last 4 Encounters:  11/16/17 220 lb (99.8 kg)  11/07/17 229 lb 2 oz (103.9 kg)  11/05/17 225 lb (102.1 kg)  10/18/17 222 lb (100.7 kg)  Psych/Mental status: Alert, oriented x 3 (person, place, & time)       Eyes: PERLA Respiratory: No evidence of acute respiratory distress  Cervical Spine Area Exam  Skin & Axial Inspection: No masses, redness, edema,  swelling, or associated skin lesions Alignment: Symmetrical Functional ROM: Unrestricted ROM       Stability: No instability detected Muscle Tone/Strength: Functionally intact. No obvious neuro-muscular anomalies detected. Sensory (Neurological): Unimpaired Palpation: No palpable anomalies              Upper Extremity (UE) Exam    Side: Right upper extremity  Side: Left upper extremity  Skin & Extremity Inspection: Skin color, temperature, and hair growth are WNL. No peripheral edema or cyanosis. No masses, redness, swelling, asymmetry, or associated skin lesions. No contractures.  Skin & Extremity Inspection: Skin color, temperature, and hair growth are WNL. No peripheral edema or cyanosis. No masses, redness, swelling, asymmetry, or associated skin lesions. No contractures.  Functional ROM: Unrestricted ROM          Functional ROM: Unrestricted ROM          Muscle Tone/Strength: Functionally intact. No obvious neuro-muscular anomalies detected.  Muscle Tone/Strength: Functionally intact. No obvious neuro-muscular anomalies detected.  Sensory (Neurological): Unimpaired          Sensory (Neurological): Unimpaired          Palpation: No palpable anomalies              Palpation: No palpable anomalies              Provocative Test(s):  Phalen's test: deferred Tinel's test: deferred Apley's scratch test (touch opposite shoulder):  Action 1 (Across chest): deferred Action 2 (Overhead): deferred Action 3 (LB reach): deferred   Provocative Test(s):  Phalen's test: deferred Tinel's test: deferred Apley's scratch test (touch opposite shoulder):  Action 1 (Across chest): deferred Action 2 (Overhead): deferred Action 3 (LB reach): deferred    Thoracic Spine Area Exam  Skin & Axial Inspection: No masses, redness, or swelling Alignment: Symmetrical Functional ROM: Unrestricted ROM Stability: No instability detected Muscle Tone/Strength: Functionally intact. No obvious neuro-muscular anomalies detected. Sensory (Neurological): Unimpaired Muscle strength & Tone: No palpable  anomalies  Lumbar Spine Area Exam  Skin & Axial Inspection: Well healed scar from previous spine surgery detected Alignment: Asymmetric Functional ROM: Decreased ROM       Stability: Possibly unstable Muscle Tone/Strength: Functionally intact. No obvious neuro-muscular anomalies detected. Sensory (Neurological): Musculoskeletal pain pattern and dermatomal Palpation: No palpable anomalies       Provocative Tests: Lumbar Hyperextension/rotation test: (+) bilaterally for facet joint pain. Lumbar quadrant test (Kemp's test): (+) due to fusion restriction. Lumbar Lateral bending test: deferred today       Patrick's Maneuver: deferred today                   FABER test: deferred today       Thigh-thrust test: deferred today       S-I compression test: deferred today       S-I distraction test: deferred today        Gait & Posture Assessment  Ambulation: Patient came in today in a wheel chair Gait: Significantly limited. Dependent on assistive device to ambulate Posture: WNL   Lower Extremity Exam    Side: Right lower extremity  Side: Left lower extremity  Stability: No instability observed          Stability: No instability observed          Skin & Extremity Inspection: Edema  Skin & Extremity Inspection: Skin color, temperature, and hair growth are WNL. No peripheral edema or cyanosis. No masses, redness, swelling, asymmetry, or associated skin lesions. No contractures.  Functional  ROM: Decreased ROM for knee joint          Functional ROM: Unrestricted ROM                  Muscle Tone/Strength: Mild-to-moderate deconditioning  Muscle Tone/Strength: Functionally intact. No obvious neuro-muscular anomalies detected.  Sensory (Neurological): Arthropathic arthralgia  Sensory (Neurological): Unimpaired  Palpation: Complains of area being tender to palpation  Palpation: No palpable anomalies   Assessment  Primary Diagnosis & Pertinent Problem List: The primary encounter diagnosis was  Tricompartment osteoarthritis of right knee. Diagnoses of Chronic pain syndrome, Chronic pain of right knee, Primary osteoarthritis of right knee, Primary osteoarthritis of both knees, Fibromyalgia, Morbid obesity ([place]), Pain management contract agreement, and S/P lumbar fusion were also pertinent to this visit.  Status Diagnosis  Worsening Worsening Worsening 1. Tricompartment osteoarthritis of right knee   2. Chronic pain syndrome   3. Chronic pain of right knee   4. Primary osteoarthritis of right knee   5. Primary osteoarthritis of both knees   6. Fibromyalgia   7. Morbid obesity ([place])   8. Pain management contract agreement   9. S/P lumbar fusion     General Recommendations: The pain condition that the patient suffers from is best treated with a multidisciplinary approach that involves an increase in physical activity to prevent de-conditioning and worsening of the pain cycle, as well as psychological counseling (formal and/or informal) to address the co-morbid psychological affects of pain. Treatment will often involve judicious use of pain medications and interventional procedures to decrease the pain, allowing the patient to participate in the physical activity that will ultimately produce long-lasting pain reductions. The goal of the multidisciplinary approach is to return the patient to a higher level of overall function and to restore their ability to perform activities of daily living.  67 year old female with a history of chronic pain syndrome secondary to morbid obesity, status post 5 lumbar spine fusion surgeries, fibromyalgia, right knee osteoarthritis, who presents with worsening severe right knee pain.  At her last visit with our nurse practitioner, Dionisio David, her hydrocodone was increased from 7.5 mg 3 times daily as needed to 7.5 mg 4 times daily as needed allowing her to have an additional dose at night when she is in severe pain.  An MRI of her right knee was also obtained  and horizontal tear of posterior horn of medial meniscus along with medial and lateral compartment osteophytosis which has worsened along with patellofemoral degenerative disease and subchondral edema in the lateral patellar facet.   Of note patient has tried right knee genicular nerve block with local anesthetic only but without any significant benefit in her right knee pain.  Patient's PCP recommended that steroids not be used for the block given her type 2 diabetes on insulin.   Patient would like a second opinion regarding surgical options available for her for her right knee pain.  I will refer her to see Dr. Rozelle Logan with [facility] for evaluation of her severe worsening right knee pain to see if surgical intervention could be helpful.  Given that patient is having severe pain, will administer 30 mg of Toradol and Norflex respectively.  Patient to follow-up with Dionisio David in approximately 2 months.  No further escalation of opioid analgesics unless discussed with MD.  Plan of Care  Pharmacotherapy (Medications Ordered): Meds ordered this encounter  Medications  . DISCONTD: HYDROcodone-acetaminophen (NORCO) 7.5-325 MG tablet    Sig: Take 1 tablet by mouth every 6 (  six) hours as needed for moderate pain.    Dispense:  120 tablet    Refill:  0    Do not place this medication, or any other prescription from our practice, on "Automatic Refill". Patient may have prescription filled one day early if pharmacy is closed on scheduled refill date.   To fill on or after: 11/18/17, 12/17/17  . orphenadrine (NORFLEX) injection 30 mg  . ketorolac (TORADOL) 30 MG/ML injection 30 mg  . HYDROcodone-acetaminophen (NORCO) 7.5-325 MG tablet    Sig: Take 1 tablet by mouth every 6 (six) hours as needed for moderate pain.    Dispense:  120 tablet    Refill:  0    Do not place this medication, or any other prescription from our practice, on "Automatic Refill". Patient may have prescription filled one day  early if pharmacy is closed on scheduled refill date.   To fill on or after: 11/18/17, 12/17/17   Lab-work, procedure(s), and/or referral(s): Orders Placed This Encounter  Procedures  . Ambulatory referral to Orthopedic Surgery    Provider-requested follow-up: Return in about 8 weeks (around 01/11/2018) for MM with Crystal.  Time Note: Greater than 50% of the 25 minute(s) of face-to-face time spent with Ms. Iannello, was spent in counseling/coordination of care regarding: Ms. Dorian primary cause of pain, the results of her recent test(s), the significance of each one oth the test(s) anomalies and it's corresponding characteristic pain pattern(s), the treatment plan, treatment alternatives, the risks and possible complications of proposed treatment, medication side effects, the opioid analgesic risks and possible complications, the results, interpretation and significance of  her recent diagnostic interventional treatment(s), the appropriate use of her medications, realistic expectations, the goals of pain management (increased in functionality), the need to bring and keep the BMI below 30, the medication agreement and the patient's responsibilities when it comes to controlled substances.  Future Appointments  Date Time Provider [place]  11/23/2017  1:00 PM Ursula Alert, MD [facility] None  12/11/2017  3:00 PM Lubertha South, LCSW [facility] None  01/02/2018  1:30 PM Lucilla Lame, MD [facility] None  01/04/2018 11:15 AM Vevelyn Francois, NP [facility] None  02/12/2018 10:30 AM Lequita Asal, MD [facility] None  07/03/2018 10:15 AM Dew, Erskine Squibb, MD [facility] None    Primary Care Physician: Sarajane Jews, MD Location: [facility] Note by: Gillis Santa, M.D Date: 11/16/2017; Time: 2:21 PM  Patient Instructions  You were given one prescription for Hydrocodone today.

## 2017-11-16 NOTE — Progress Notes (Signed)
Nursing Pain Medication Assessment:  Safety precautions to be maintained throughout the outpatient stay will include: orient to surroundings, keep bed in low position, maintain call bell within reach at all times, provide assistance with transfer out of bed and ambulation.  Medication Inspection Compliance: Stephanie [name] did not comply with our request to bring her pills to be counted. She was reminded that bringing the medication bottles, even when empty, is a requirement.  Medication: None brought in. Pill/Patch Count: None available to be counted. Bottle Appearance: No container available. Did not bring bottle(s) to appointment. Filled Date: N/A Last Medication intake:  Today  Patient lives in a facility, they given her meds to her

## 2017-11-16 NOTE — Patient Instructions (Signed)
You were given one prescription for Hydrocodone today. 

## 2017-11-23 ENCOUNTER — Ambulatory Visit (INDEPENDENT_AMBULATORY_CARE_PROVIDER_SITE_OTHER): Payer: Medicare Other | Admitting: Psychiatry

## 2017-11-23 ENCOUNTER — Encounter: Payer: Self-pay | Admitting: Psychiatry

## 2017-11-23 VITALS — BP 95/66 | HR 68 | Temp 97.7°F | Ht 63.0 in

## 2017-11-23 DIAGNOSIS — F411 Generalized anxiety disorder: Secondary | ICD-10-CM | POA: Diagnosis not present

## 2017-11-23 DIAGNOSIS — F3130 Bipolar disorder, current episode depressed, mild or moderate severity, unspecified: Secondary | ICD-10-CM | POA: Diagnosis not present

## 2017-11-23 DIAGNOSIS — F5105 Insomnia due to other mental disorder: Secondary | ICD-10-CM | POA: Diagnosis not present

## 2017-11-23 MED ORDER — NORTRIPTYLINE HCL 10 MG PO CAPS: 20.0000 mg | ORAL_CAPSULE | Freq: Every day | ORAL | 1 refills | Status: DC

## 2017-11-23 NOTE — Patient Instructions (Signed)
Nortriptyline capsules What is this medicine? NORTRIPTYLINE (nor TRIP ti leen) is used to treat depression. This medicine may be used for other purposes; ask your health care provider or pharmacist if you have questions. COMMON BRAND NAME(S): Aventyl, Pamelor What should I tell my health care provider before I take this medicine? They need to know if you have any of these conditions: -an alcohol problem -bipolar disorder or schizophrenia -difficulty passing urine, prostate trouble -glaucoma -heart disease or recent heart attack -liver disease -over active thyroid -seizures -thoughts or plans of suicide or a previous suicide attempt or family history of suicide attempt -an unusual or allergic reaction to nortriptyline, other medicines, foods, dyes, or preservatives -pregnant or trying to get pregnant -breast-feeding How should I use this medicine? Take this medicine by mouth with a glass of water. Follow the directions on the prescription label. Take your doses at regular intervals. Do not take it more often than directed. Do not stop taking this medicine suddenly except upon the advice of your doctor. Stopping this medicine too quickly may cause serious side effects or your condition may worsen. A special MedGuide will be given to you by the pharmacist with each prescription and refill. Be sure to read this information carefully each time. Talk to your pediatrician regarding the use of this medicine in children. Special care may be needed. Overdosage: If you think you have taken too much of this medicine contact a poison control center or emergency room at once. NOTE: This medicine is only for you. Do not share this medicine with others. What if I miss a dose? If you miss a dose, take it as soon as you can. If it is almost time for your next dose, take only that dose. Do not take double or extra doses. What may interact with this medicine? Do not take this medicine with any of the  following medications: -arsenic trioxide -certain medicines medicines for irregular heart beat -cisapride -halofantrine -linezolid -MAOIs like Carbex, Eldepryl, Marplan, Nardil, and Parnate -methylene blue (injected into a vein) -other medicines for mental depression -phenothiazines like perphenazine, thioridazine and chlorpromazine -pimozide -probucol -procarbazine -sparfloxacin -St. John's Wort -ziprasidone This medicine may also interact with any of the following medications: -atropine and related drugs like hyoscyamine, scopolamine, tolterodine and others -barbiturate medicines for inducing sleep or treating seizures, such as phenobarbital -cimetidine -medicines for diabetes -medicines for seizures like carbamazepine or phenytoin -reserpine -thyroid medicine This list may not describe all possible interactions. Give your health care provider a list of all the medicines, herbs, non-prescription drugs, or dietary supplements you use. Also tell them if you smoke, drink alcohol, or use illegal drugs. Some items may interact with your medicine. What should I watch for while using this medicine? Tell your doctor if your symptoms do not get better or if they get worse. Visit your doctor or health care professional for regular checks on your progress. Because it may take several weeks to see the full effects of this medicine, it is important to continue your treatment as prescribed by your doctor. Patients and their families should watch out for new or worsening thoughts of suicide or depression. Also watch out for sudden changes in feelings such as feeling anxious, agitated, panicky, irritable, hostile, aggressive, impulsive, severely restless, overly excited and hyperactive, or not being able to sleep. If this happens, especially at the beginning of treatment or after a change in dose, call your health care professional. Dennis Bast may get drowsy or dizzy. Do not drive,  use machinery, or do  anything that needs mental alertness until you know how this medicine affects you. Do not stand or sit up quickly, especially if you are an older patient. This reduces the risk of dizzy or fainting spells. Alcohol may interfere with the effect of this medicine. Avoid alcoholic drinks. Do not treat yourself for coughs, colds, or allergies without asking your doctor or health care professional for advice. Some ingredients can increase possible side effects. Your mouth may get dry. Chewing sugarless gum or sucking hard candy, and drinking plenty of water may help. Contact your doctor if the problem does not go away or is severe. This medicine may cause dry eyes and blurred vision. If you wear contact lenses you may feel some discomfort. Lubricating drops may help. See your eye doctor if the problem does not go away or is severe. This medicine can cause constipation. Try to have a bowel movement at least every 2 to 3 days. If you do not have a bowel movement for 3 days, call your doctor or health care professional. This medicine can make you more sensitive to the sun. Keep out of the sun. If you cannot avoid being in the sun, wear protective clothing and use sunscreen. Do not use sun lamps or tanning beds/booths. What side effects may I notice from receiving this medicine? Side effects that you should report to your doctor or health care professional as soon as possible: -allergic reactions like skin rash, itching or hives, swelling of the face, lips, or tongue -anxious -breathing problems -changes in vision -confusion -elevated mood, decreased need for sleep, racing thoughts, impulsive behavior -eye pain -fast, irregular heartbeat -feeling faint or lightheaded, falls -feeling agitated, angry, or irritable -fever with increased sweating -hallucination, loss of contact with reality -seizures -stiff muscles -suicidal thoughts or other mood changes -tingling, pain, or numbness in the feet or  hands -trouble passing urine or change in the amount of urine -trouble sleeping -unusually weak or tired -vomiting -yellowing of the eyes or skin Side effects that usually do not require medical attention (report to your doctor or health care professional if they continue or are bothersome): -change in sex drive or performance -change in appetite or weight -constipation -dizziness -dry mouth -nausea -tired -tremors -upset stomach This list may not describe all possible side effects. Call your doctor for medical advice about side effects. You may report side effects to FDA at 1-800-FDA-1088. Where should I keep my medicine? Keep out of the reach of children. Store at room temperature between 15 and 30 degrees C (59 and 86 degrees F). Keep container tightly closed. Throw away any unused medicine after the expiration date. NOTE: This sheet is a summary. It may not cover all possible information. If you have questions about this medicine, talk to your doctor, pharmacist, or health care provider.  2018 [employer]/Gold Standard (2015-10-23 12:53:08)

## 2017-11-23 NOTE — Progress Notes (Signed)
[facility] MD OP Progress Note  11/23/2017 4:53 PM Stephanie [name]  MRN:  [id number]  Chief Complaint: ' I am here for follow up." Chief Complaint    Follow-up     HPI: Stephanie [name] is a 67 year old Caucasian female, divorced, lives at the [facility] on [place], has a history of bipolar disorder, generalized anxiety disorder, RLS, chronic pain, insomnia as well as multiple medical problems including COPD, history of lung nodules, sleep apnea, ascites, status post cholecystectomy, history of hepatitis B, history of stroke, history of vitamin D deficiency, history of aortic dissection, hyperlipidemia, hypertension, thrombocytopenia, chronic anemia, presented to the clinic today for a follow-up visit.  She today presented along with staff at [facility].  Patient today is tearful.  Patient reports she is in pain all the time.  She reports she had an MRI done of her knee which shows abnormalities.  Patient reports it has been difficult for her to get up and walk and do the activities that she would normally do because of her knee pain.  Patient reports she is waiting to hear back from her providers as to what they want to do.  Patient reports this has been making her more depressed.  She reports she does not enjoy things that like she used to before.  Patient also struggles with sleep.  She reports her pain keeps her awake.  Patient does not think the mirtazapine is effective for sleep.  She is on 45 mg.  She reports she has been on it for a while.  Discussed other sleep aids with patient.  Discussed Pamelor.  Patient reports she has never been tried on it.  Provided her medication education regarding side effects of medications like Pamelor.  Discussed with her about the risk of dry mouth, blurry vision, constipation, urinary retention and cardiac effects.  Patient denies any suicidality.  She has continued support system at the facility.  She is today here with a staff from [facility]F who provided collateral  information.  Visit Diagnosis:    ICD-10-CM   1. Bipolar I disorder, most recent episode depressed ([facility]) F31.30   2. GAD (generalized anxiety disorder) F41.1   3. Insomnia due to mental disorder F51.05     Past Psychiatric History: Reviewed past psychiatric history from my progress note on 08/24/2017.  Past trials of Seroquel, trazodone, Ambien.  Past Medical History:  Past Medical History:  Diagnosis Date  . Adenomatous colon polyp 08/27/2014   Polyps x 3  . Anemia   . Anxiety   . 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 Arthritis   . Asthma   . Bipolar affective disorder ([place])    h/o  . Blood transfusion 2000  . Blood transfusion without reported diagnosis   . Cancer ([place])   . Chronic abdominal pain    abdominal wall pain  . 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 Colon polyp   . Depression   . Diabetes mellitus without complication ([place])   . Fatty liver 04/09/08   found in abd CT  . GERD (gastroesophageal reflux disease)   . H/O: CVA (cardiovascular accident)    TIA  . H/O: rheumatic fever   . Hepatitis    HEP "B" twenty years ago  . Hyperlipidemia   . Incontinence   . Insomnia   . Lower extremity edema   . Obstructive sleep apnea    Use C-PAP twice per week  . Platelets decreased ([place])   .  RLS (restless legs syndrome)   . Shortness of breath   . Sleep apnea   . Stroke [facility])    TIA- 2002  . Ulcer   . Unspecified disorders of nervous system     Past Surgical History:  Procedure Laterality Date  . ABDOMINAL EXPLORATION SURGERY    . ABDOMINAL HYSTERECTOMY     total  . Axillary artery cannulation via 8-mm Hemashield graft, median sternotomy, extracorporeal circulation with deep hypothermic circulatory arrest, repair of  aortic dessection  01/23/2009   Dr Arlyce Dice  . BACK SURGERY      x 5  . cataract     both eyes  . CHOLECYSTECTOMY    . COLONOSCOPY    . EYE SURGERY Bilateral    Cataract Extraction with IOL  . HEMORROIDECTOMY     and colon polyp removed   . KNEE ARTHROSCOPY WITH LATERAL RELEASE Right 11/15/2016   Procedure: KNEE ARTHROSCOPY WITH LATERAL RELEASE;  Surgeon: Corky Mull, MD;  Location: [facility];  Service: Orthopedics;  Laterality: Right;  . LIVER BIOPSY  04/10/2012   Procedure: LIVER BIOPSY;  Surgeon: Inda Castle, MD;  Location: [facility];  Service: Endoscopy;  Laterality: N/A;  ultrasound to mark order for abd limited/liver to be marked to be sent by [email or web address]   . LUMBAR FUSION  10/09  . LUMBAR WOUND DEBRIDEMENT  05/09/2011   Procedure: LUMBAR WOUND DEBRIDEMENT;  Surgeon: Dahlia Bailiff;  Location: [facility];  Service: Orthopedics;  Laterality: N/A;  IRRIGATION AND DEBRIDEMENT SPINAL WOUND  . POSTERIOR CERVICAL FUSION/FORAMINOTOMY    . ROTATOR CUFF REPAIR     left    Family Psychiatric History: Have reviewed family psychiatric history from my progress note on 08/24/2017  Family History:  Family History  Problem Relation Age of Onset  . Breast cancer Mother   . Lung cancer Mother   . Stomach cancer Father   . Diabetes Unknown        4 aunts, and 1 uncle  . Depression Brother   . Anxiety disorder Brother   . Colon cancer Neg Hx   . Esophageal cancer Neg Hx   . Rectal cancer Neg Hx    Substance abuse history: Denies  Social History: Reviewed social history from my progress note on 08/24/2017 Social History   Socioeconomic History  . Marital status: Divorced    Spouse name: Not on file  . Number of children: 2  . Years of education: Not on file  . Highest education level: 8th grade  Occupational History  . Occupation: Disabled    Employer: [employer]  Social Needs  . Financial resource strain: Not hard at all  . Food insecurity:    Worry: Never true    Inability: Never true  . Transportation needs:    Medical: No    Non-medical: No  Tobacco Use  . Smoking status: Former Smoker    Packs/day: 1.50    Years: 50.00    Pack years: 75.00    Types: Cigarettes    Last attempt to quit: 04/06/2013     Years since quitting: 4.6  . Smokeless tobacco: Never Used  Substance and Sexual Activity  . Alcohol use: No    Alcohol/week: 0.0 oz  . Drug use: No  . Sexual activity: Never  Lifestyle  . Physical activity:    Days per week: 0 days    Minutes per session: 0 min  . Stress: Very much  Relationships  . Social connections:  Talks on phone: Not on file    Gets together: Not on file    Attends religious service: Never    Active member of club or organization: No    Attends meetings of clubs or organizations: Never    Relationship status: Divorced  Other Topics Concern  . Not on file  Social History Narrative   1 son, 110+ y/o   Daily caffeine use: 2 cups daily   Does not get regular exercise   Disability due to bipolar      Has a living will- she is a DNR (she has form at home per pt).    Allergies:  Allergies  Allergen Reactions  . Doxycycline Hives, Swelling, Other (See Comments) and Nausea Only    Reaction:  Facial swelling  Face red and swollen; no difficulty breathing    Metabolic Disorder Labs: Lab Results  Component Value Date   HGBA1C 5.2 07/20/2016   MPG 103 07/20/2016   MPG 131 04/18/2016   No results found for: PROLACTIN Lab Results  Component Value Date   CHOL 194 04/05/2016   TRIG 47 04/05/2016   HDL 110 04/05/2016   CHOLHDL 1.8 04/05/2016   VLDL 9 04/05/2016   LDLCALC 75 04/05/2016   LDLCALC 59 12/15/2014   Lab Results  Component Value Date   TSH 4.486 07/19/2017   TSH 4.24 12/15/2014    Therapeutic Level Labs: No results found for: LITHIUM No results found for: VALPROATE No components found for:  CBMZ  Current Medications: Current Outpatient Medications  Medication Sig Dispense Refill  . acetaminophen (TYLENOL) 325 MG tablet Take 650 mg by mouth every 6 (six) hours as needed for mild pain.    Marland Kitchen albuterol (PROVENTIL HFA;VENTOLIN HFA) 108 (90 Base) MCG/ACT inhaler Inhale 2 puffs into the lungs every 6 (six) hours as needed for wheezing  or shortness of breath.    Marland Kitchen albuterol (PROVENTIL) (2.5 MG/3ML) 0.083% nebulizer solution Take 2.5 mg by nebulization every 6 (six) hours as needed for wheezing or shortness of breath.    . budesonide-formoterol (SYMBICORT) 160-4.5 MCG/ACT inhaler Inhale 2 puffs into the lungs 2 (two) times daily.     Marland Kitchen buPROPion (WELLBUTRIN XL) 150 MG 24 hr tablet Take 1 tablet (150 mg total) by mouth daily. 90 tablet 1  . clonazePAM (KLONOPIN) 0.5 MG tablet Take 0.5 mg by mouth 3 (three) times daily.     . Colloidal Oatmeal (EUCERIN ECZEMA RELIEF) 1 % CREA Apply 1 application topically 2 (two) times daily. Apply to right lower leg twice daily.    . diclofenac sodium (VOLTAREN) 1 % GEL Apply 2 g topically every 8 (eight) hours as needed (arthritis). Apply to right knee    . dicyclomine (BENTYL) 10 MG capsule Take 10 mg by mouth 3 (three) times daily.     . ferrous sulfate 325 (65 FE) MG tablet Take 325 mg by mouth 2 (two) times daily.     . furosemide (LASIX) 80 MG tablet Take 80 mg by mouth.    . gabapentin (NEURONTIN) 600 MG tablet Take 600 mg by mouth 2 (two) times daily.    Marland Kitchen guaiFENesin-codeine (ROBITUSSIN AC) 100-10 MG/5ML syrup Take 15 mLs by mouth every 6 (six) hours as needed for cough.    . hydrochlorothiazide (MICROZIDE) 12.5 MG capsule Take 12.5 mg by mouth daily with supper.    Marland Kitchen HYDROcodone-acetaminophen (NORCO) 7.5-325 MG tablet Take 1 tablet by mouth every 6 (six) hours as needed for moderate pain. 120 tablet 0  .  insulin glargine (LANTUS) 100 UNIT/ML injection Inject 50 Units into the skin every evening.     . insulin lispro (HUMALOG) 100 UNIT/ML injection Inject 8 Units into the skin 3 (three) times daily before meals.     . lactulose (CHRONULAC) 10 GM/15ML solution Take 10 g by mouth daily. (0800)    . lamoTRIgine (LAMICTAL) 100 MG tablet Take 1 tablet (100 mg total) by mouth 2 (two) times daily. (0800 & 2000) 180 tablet 1  . lamoTRIgine (LAMICTAL) 25 MG tablet Take 1 tablet (25 mg total) by  mouth daily. To be added to the Lamictal 100 mg in the AM 90 tablet 1  . lidocaine (LIDODERM) 5 % Place 2 patches onto the skin daily. Apply 1 patch to 2 areas on back (2 patches) every morning and remove at bedtime.    . liraglutide (VICTOZA) 18 MG/3ML SOPN Inject 1.8 mg into the skin daily.    Marland Kitchen LYRICA 150 MG capsule Take 150 mg by mouth 3 (three) times daily. (0800, 1400 & 2000)    . metFORMIN (GLUCOPHAGE-XR) 500 MG 24 hr tablet Take 500 mg by mouth twice daily.    . metoCLOPramide (REGLAN) 5 MG tablet Take 5 mg by mouth 4 (four) times daily -  before meals and at bedtime. (0800, 1200, 1600 & 2000)    . neomycin-bacitracin-polymyxin (NEOSPORIN) [phone] ointment Apply 1 application topically every 12 (twelve) hours as needed (to right forearm).    . nortriptyline (PAMELOR) 10 MG capsule Take 2 capsules (20 mg total) by mouth at bedtime. For sleep, pain, anxiety 180 capsule 1  . nystatin (MYCOSTATIN/NYSTOP) powder Apply topically 4 (four) times daily. (Patient taking differently: Apply topically 4 (four) times daily. Apply to inner thigh/ peri area twice daily and apply to abdomen 4 times daily.) 15 g 0  . omeprazole (PRILOSEC) 20 MG capsule Take 20 mg by mouth daily.     . ondansetron (ZOFRAN) 4 MG tablet Take 4 mg by mouth every 4 (four) hours as needed for nausea or vomiting.    Marland Kitchen oxybutynin (DITROPAN-XL) 5 MG 24 hr tablet Take 5 mg by mouth daily. (0800)    . potassium chloride (K-DUR,KLOR-CON) 10 MEQ tablet Take 1 tablet (10 mEq total) by mouth daily for 4 days. 4 tablet 0  . rOPINIRole (REQUIP) 3 MG tablet Take 3 mg by mouth at bedtime. (2000)    . senna (SENOKOT) 8.6 MG tablet Take 2 tablets by mouth at bedtime.    . simvastatin (ZOCOR) 10 MG tablet Take 10 mg by mouth at bedtime. (2000)    . spironolactone (ALDACTONE) 50 MG tablet Take 50 mg by mouth 2 (two) times daily. (0800)    . vitamin C (ASCORBIC ACID) 500 MG tablet Take 500 mg by mouth 2 (two) times daily. (0800 & 2000)     No  current facility-administered medications for this visit.      Musculoskeletal: Strength & Muscle Tone: within normal limits Gait & Station: in a wheelchair Patient leans: N/A  Psychiatric Specialty Exam: Review of Systems  Musculoskeletal: Positive for joint pain.  Psychiatric/Behavioral: Positive for depression. The patient is nervous/anxious and has insomnia.   All other systems reviewed and are negative.   Blood pressure 95/66, pulse 68, temperature 97.7 F (36.5 C), height 5' 3"  (1.6 m).Body mass index is 38.97 kg/m.  General Appearance: Casual  Eye Contact:  Fair  Speech:  Normal Rate  Volume:  Normal  Mood:  Anxious and Dysphoric  Affect:  Tearful  Thought Process:  Goal Directed and Descriptions of Associations: Intact  Orientation:  Full (Time, Place, and Person)  Thought Content: Logical   Suicidal Thoughts:  No  Homicidal Thoughts:  No  Memory:  Immediate;   Fair Recent;   Fair Remote;   Fair  Judgement:  Fair  Insight:  Fair  Psychomotor Activity:  Normal  Concentration:  Concentration: Fair and Attention Span: Fair  Recall:  [employer] of Knowledge: Fair  Language: Fair  Akathisia:  No  Handed:  Right  AIMS (if indicated: na  Assets:  Communication Skills Desire for Improvement Social Support  ADL's:  Intact  Cognition: WNL  Sleep:  Poor   Screenings: PHQ2-9     Clinical Support from 11/16/2017 in [facility] Office Visit from 10/18/2017 in [facility] Procedure visit from 10/04/2017 in [facility] Office Visit from 09/25/2017 in [facility] Visit from 08/14/2017 in [facility]  PHQ-2 Total Score  0  1  0  0  0       Assessment and Plan: Rayanne is a 67 year old Caucasian female, divorced, on SSD, has a history of bipolar disorder,  anxiety disorder, insomnia, RLS, sleep apnea, multiple medical problems like history of stroke, hepatitis B, thrombocytopenia, COPD, ascites, hyperlipidemia, hypertension and aortic dissection, vitamin D deficiency, presented to the clinic today for a follow-up visit.  Patient today presented with staff at her assisted living facility.  Patient continues to struggle with depressive symptoms, anxiety symptoms as well as sleep issues.  Her mood symptoms and sleep issues are more so due to her pain.  Discussed plan as noted below.  Plan Bipolar disorder Continue Lamictal 125 mg p.o. every morning and 100 mg p.o. q. p.m. Continue Wellbutrin XL 150 mg p.o. daily.  For GAD Discussed tapering off of Klonopin 0.25 mg p.o. 3 times daily to twice daily.  Patient is being prescribed Klonopin by her provider at the facility. Discontinue Remeron for lack of efficacy. Continue psychotherapy with Ms. Peacock  For insomnia Added Pamelor 20 mg p.o. nightly  Discussed following up with her pain provider.  Follow-up in clinic in 8 weeks.  More than 50 % of the time was spent for psychoeducation and supportive psychotherapy and care coordination.  This note was generated in part or whole with voice recognition software. Voice recognition is usually quite accurate but there are transcription errors that can and very often do occur. I apologize for any typographical errors that were not detected and corrected.         Ursula Alert, MD 11/24/2017, 10:10 AM

## 2017-11-24 ENCOUNTER — Encounter: Payer: Self-pay | Admitting: Psychiatry

## 2017-11-27 IMAGING — US US EXTREM LOW VENOUS BILAT
1 series · 13 of 24 positions shown · non-contrast
Comparison: None.

CLINICAL DATA: Shortness of breath.  Leg swelling for 2 months.



[Series 1: us extrem low venous bilat · 0.09mm/px · 13 of 57 slices shown]
[im 1/57]
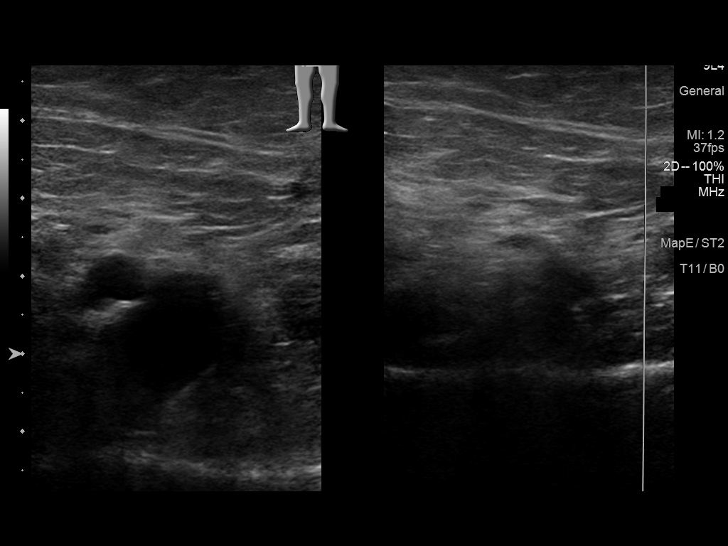
[im 5/57]
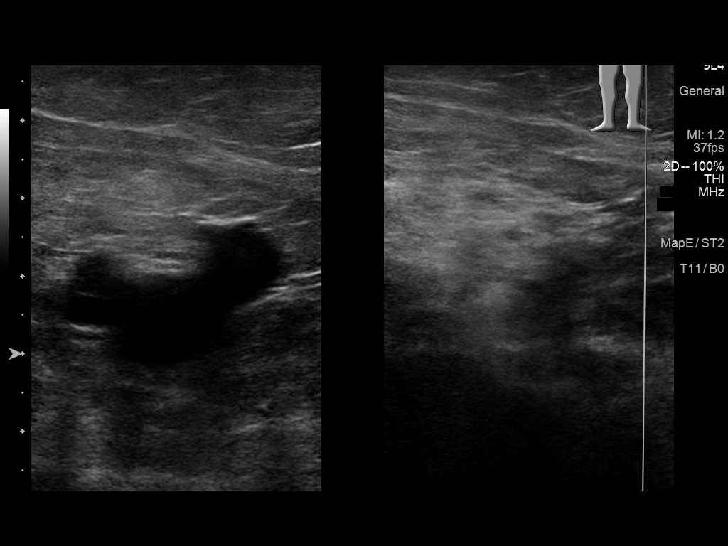
[im 10/57]
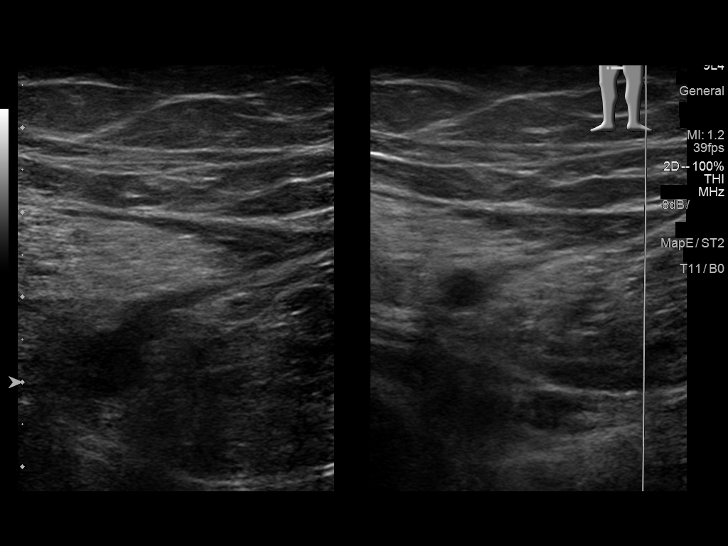
[im 15/57]
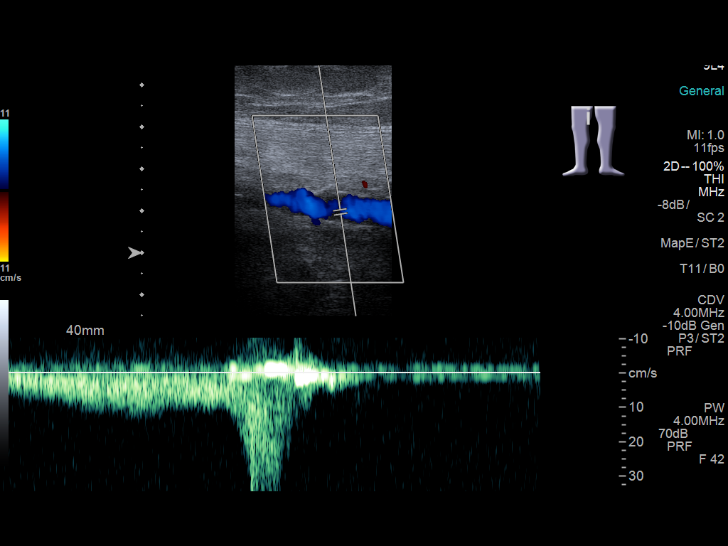
[im 20/57]
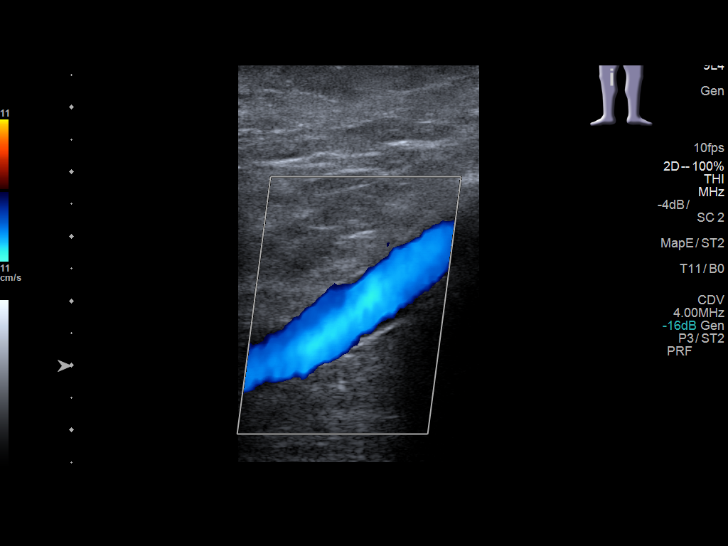
[im 25/57]
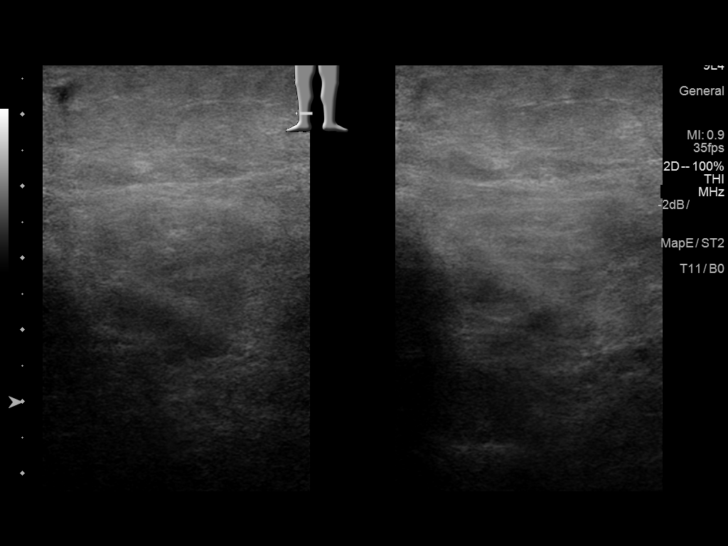
[im 30/57]
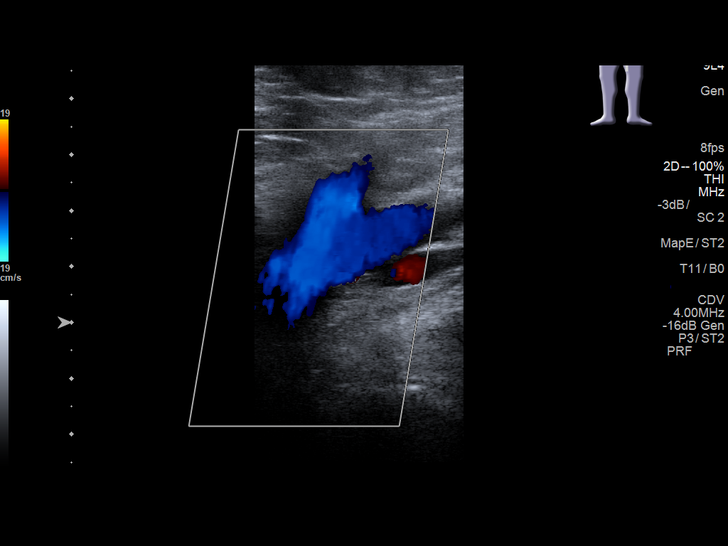
[im 32/57]
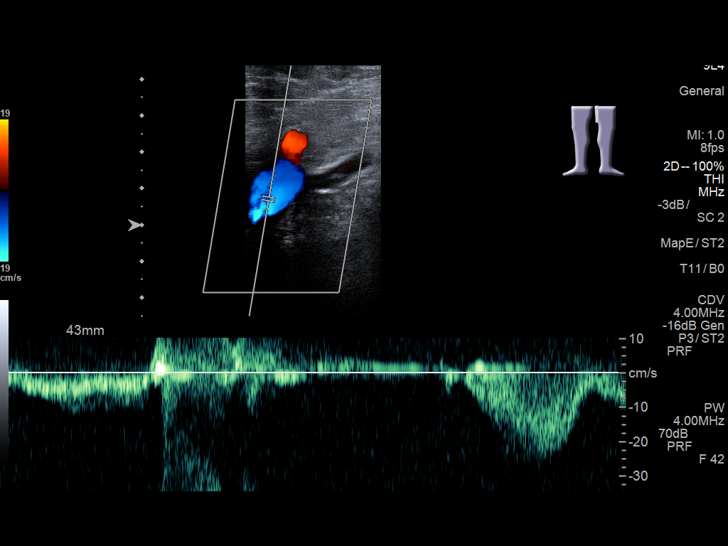
[im 37/57]
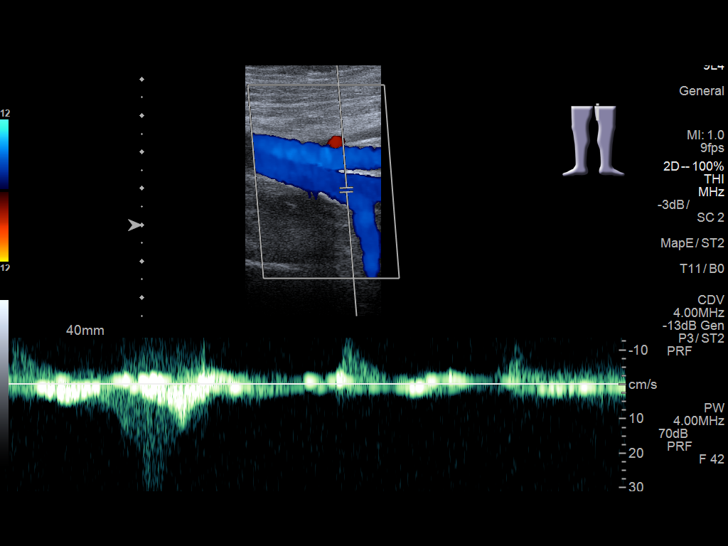
[im 42/57]
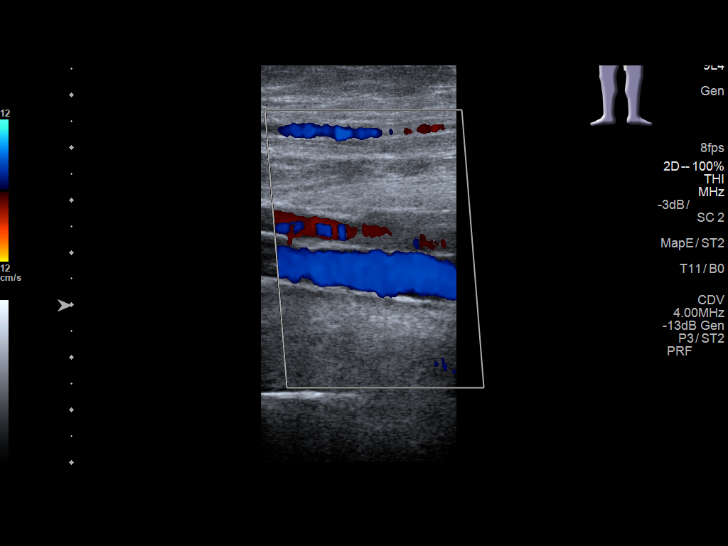
[im 47/57]
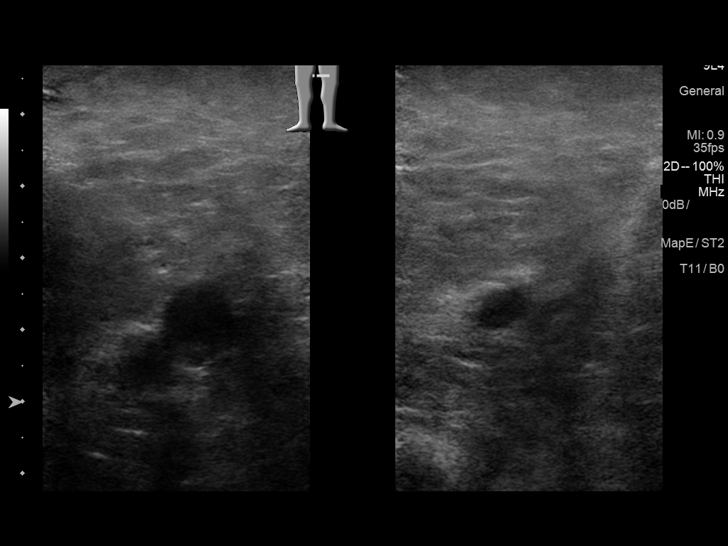
[im 52/57]
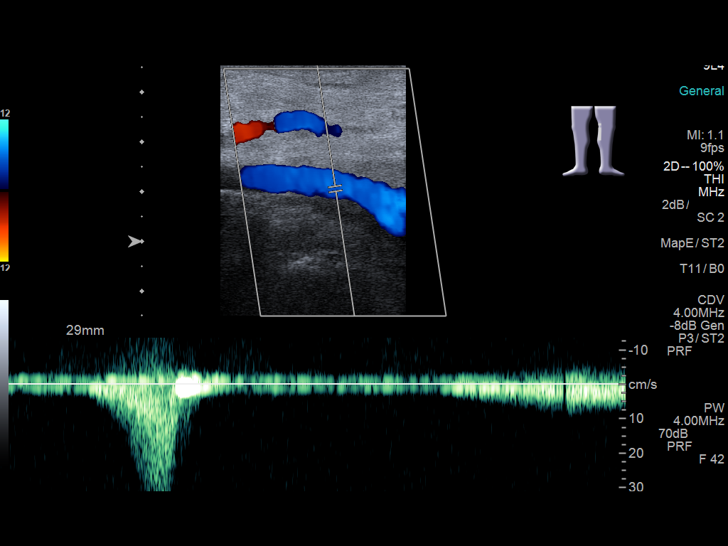
[im 57/57]
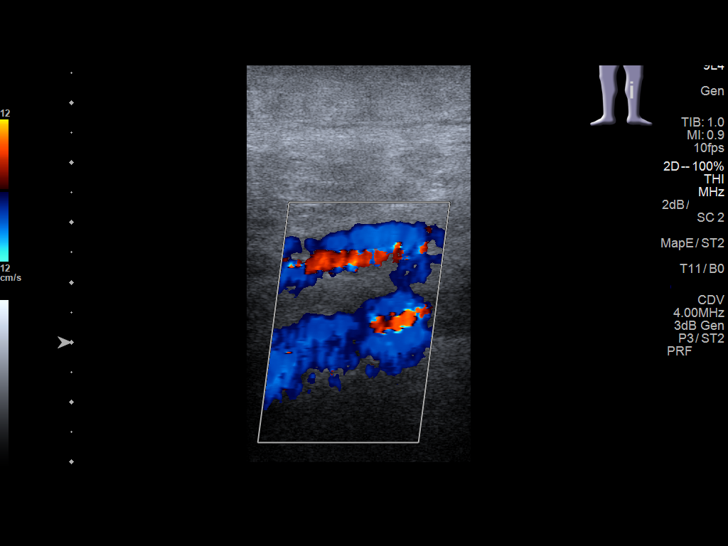

[13 of 24 positions shown; findings below may reference images not displayed]

FINDINGS: RIGHT LOWER EXTREMITY

Common Femoral Vein: No evidence of thrombus. Normal
compressibility, respiratory phasicity and response to augmentation.

Saphenofemoral Junction: No evidence of thrombus. Normal
compressibility and flow on color Doppler imaging.

Profunda Femoral Vein: No evidence of thrombus. Normal
compressibility and flow on color Doppler imaging.

Femoral Vein: No evidence of thrombus. Normal compressibility,
respiratory phasicity and response to augmentation.

Popliteal Vein: No evidence of thrombus. Normal compressibility,
respiratory phasicity and response to augmentation.

Calf Veins: No evidence of thrombus. Normal compressibility and flow
on color Doppler imaging.

Superficial Great Saphenous Vein: No evidence of thrombus. Normal
compressibility and flow on color Doppler imaging.

Venous Reflux:  None.

Other Findings:  None.

LEFT LOWER EXTREMITY

Common Femoral Vein: No evidence of thrombus. Normal
compressibility, respiratory phasicity and response to augmentation.

Saphenofemoral Junction: No evidence of thrombus. Normal
compressibility and flow on color Doppler imaging.

Profunda Femoral Vein: No evidence of thrombus. Normal
compressibility and flow on color Doppler imaging.

Femoral Vein: No evidence of thrombus. Normal compressibility,
respiratory phasicity and response to augmentation.

Popliteal Vein: No evidence of thrombus. Normal compressibility,
respiratory phasicity and response to augmentation.

Calf Veins: No evidence of thrombus. Normal compressibility and flow
on color Doppler imaging.

Superficial Great Saphenous Vein: No evidence of thrombus. Normal
compressibility and flow on color Doppler imaging.

Venous Reflux:  None.

Other Findings:  None.
IMPRESSION: No evidence of deep venous thrombosis.

## 2017-11-30 ENCOUNTER — Emergency Department
Admission: EM | Admit: 2017-11-30 | Discharge: 2017-11-30 | Disposition: A | Discharge status: Home / Self Care | Payer: Medicare Other | Attending: Emergency Medicine | Admitting: Emergency Medicine

## 2017-11-30 ENCOUNTER — Encounter: Payer: Self-pay | Admitting: Emergency Medicine

## 2017-11-30 ENCOUNTER — Emergency Department: Payer: Medicare Other

## 2017-11-30 DIAGNOSIS — Y9384 Activity, sleeping: Secondary | ICD-10-CM | POA: Diagnosis not present

## 2017-11-30 DIAGNOSIS — Y999 Unspecified external cause status: Secondary | ICD-10-CM | POA: Diagnosis not present

## 2017-11-30 DIAGNOSIS — E109 Type 1 diabetes mellitus without complications: Secondary | ICD-10-CM | POA: Insufficient documentation

## 2017-11-30 DIAGNOSIS — J45909 Unspecified asthma, uncomplicated: Secondary | ICD-10-CM | POA: Insufficient documentation

## 2017-11-30 DIAGNOSIS — R51 Headache: Secondary | ICD-10-CM | POA: Insufficient documentation

## 2017-11-30 DIAGNOSIS — Z794 Long term (current) use of insulin: Secondary | ICD-10-CM | POA: Diagnosis not present

## 2017-11-30 DIAGNOSIS — Z8673 Personal history of transient ischemic attack (TIA), and cerebral infarction without residual deficits: Secondary | ICD-10-CM | POA: Diagnosis not present

## 2017-11-30 DIAGNOSIS — W19XXXA Unspecified fall, initial encounter: Secondary | ICD-10-CM

## 2017-11-30 DIAGNOSIS — W06XXXA Fall from bed, initial encounter: Secondary | ICD-10-CM | POA: Diagnosis not present

## 2017-11-30 DIAGNOSIS — Y92121 Bathroom in nursing home as the place of occurrence of the external cause: Secondary | ICD-10-CM | POA: Insufficient documentation

## 2017-11-30 DIAGNOSIS — Z87891 Personal history of nicotine dependence: Secondary | ICD-10-CM | POA: Diagnosis not present

## 2017-11-30 DIAGNOSIS — Z79899 Other long term (current) drug therapy: Secondary | ICD-10-CM | POA: Insufficient documentation

## 2017-11-30 DIAGNOSIS — M25562 Pain in left knee: Secondary | ICD-10-CM | POA: Insufficient documentation

## 2017-11-30 MED ORDER — ACETAMINOPHEN 325 MG PO TABS
650.0000 mg | ORAL_TABLET | Freq: Once | ORAL | Status: AC
  Administered 2017-11-30: 650 mg via ORAL
  Filled 2017-11-30: qty 2

## 2017-11-30 NOTE — ED Notes (Signed)
E-signature pad unavailable at this time.  Patient verbalized understanding of all discharge instructions.  Patient has a friend that is here for transportation back to the [facility].

## 2017-11-30 NOTE — ED Provider Notes (Signed)
[facility] Emergency Department Provider Note  ____________________________________________  Time seen: Approximately 9:46 PM  I have reviewed the triage vital signs and the nursing notes.   HISTORY  Chief Complaint Fall    HPI Stephanie [name] is a 67 y.o. female presents to the emergency department with left-sided forehead pain and left knee discomfort after patient accidentally rolled out of bed.  Patient did not lose consciousness.  She denies blurry vision, nausea, vomiting, disorientation, confusion, weakness, radiculopathy or changes in sensation of the upper or lower extremities.  No neck pain. No skin compromise. Patient is a resident with the [facility].    Past Medical History:  Diagnosis Date  . Adenomatous colon polyp 08/27/2014   Polyps x 3  . Anemia   . Anxiety   . 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 Arthritis   . Asthma   . Bipolar affective disorder ([place])    h/o  . Blood transfusion 2000  . Blood transfusion without reported diagnosis   . Cancer ([place])   . Chronic abdominal pain    abdominal wall pain  . 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 Colon polyp   . Depression   . Diabetes mellitus without complication ([place])   . Fatty liver 04/09/08   found in abd CT  . GERD (gastroesophageal reflux disease)   . H/O: CVA (cardiovascular accident)    TIA  . H/O: rheumatic fever   . Hepatitis    HEP "B" twenty years ago  . Hyperlipidemia   . Incontinence   . Insomnia   . Lower extremity edema   . Obstructive sleep apnea    Use C-PAP twice per week  . Platelets decreased ([place])   . RLS (restless legs syndrome)   . Shortness of breath   . Sleep apnea   . Stroke [facility])    TIA- 2002  . Ulcer   . Unspecified disorders of nervous system     Patient Active Problem List   Diagnosis Date Noted  . Tricompartment osteoarthritis of right knee 10/18/2017  . Sepsis ([place]) 07/20/2017  . Obesity, Class III, BMI 40-49.9 (morbid obesity)  ([place]) 05/19/2017  . Cervical stenosis of spine 04/25/2017  . Stress fracture of femur 04/17/2017  . Elevated alkaline phosphatase level 04/16/2017  . Altered mental status 03/07/2017  . Degenerative tear of lateral meniscus of right knee 11/15/2016  . Encephalopathy 07/19/2016  . Degenerative tear of medial meniscus of right knee 07/01/2016  . Primary osteoarthritis of right knee 07/01/2016  . Diabetes mellitus type 1, uncontrolled, without complications ([place]) 83/41/9622  . Lymphedema 06/14/2016  . Pain in limb 05/06/2016  . Swelling of limb 05/06/2016  . Postphlebitic syndrome with inflammation 05/06/2016  . Pneumonia 04/18/2016  . Chest pain 04/04/2016  . Iron deficiency anemia 01/30/2016  . Bilateral carotid artery stenosis 01/12/2016  . Hypoglycemia 09/09/2015  . Hyperglycemia 07/16/2015  . Neuropathy 06/08/2015  . Thrombocytopenia ([place]) 08/19/2014  . Abdominal pain, chronic, epigastric 06/12/2014  . Liver cirrhosis secondary to NASH ([place]) 02/27/2014  . Dizziness and giddiness 10/30/2013  . DOE (dyspnea on exertion) 10/30/2013  . Poorly controlled type 2 diabetes mellitus with complication ([place]) 29/79/8921  . DNR (do not resuscitate) 07/29/2013  . Encounter for routine gynecological examination 07/29/2013  . Dyspnea 07/10/2013  . Lung nodule 07/10/2013  . Varicose veins of left lower extremity 10/09/2012  . GERD (gastroesophageal reflux disease) 10/09/2012  . Lower extremity edema 02/02/2012  .  Cirrhosis of liver ([place]) 02/02/2012  . Anxiety 07/11/2011  . Aortic dissection ([place])   . BACK PAIN, LUMBAR, CHRONIC 11/19/2009  . UNS ADVRS EFF OTH RX MEDICINAL&BIOLOGICAL SBSTNC 12/16/2008  . UNSPECIFIED VITAMIN D DEFICIENCY 09/26/2008  . TOBACCO ABUSE 06/19/2008  . RESTLESS LEGS SYNDROME 04/27/2007  . INSOMNIA 04/27/2007  . HLD (hyperlipidemia) 03/08/2007  . OSA (obstructive sleep apnea) 03/08/2007  . Essential hypertension 03/08/2007  . Asthma 03/08/2007  . BIPOLAR  AFFECTIVE DISORDER, HX OF 03/08/2007    Past Surgical History:  Procedure Laterality Date  . ABDOMINAL EXPLORATION SURGERY    . ABDOMINAL HYSTERECTOMY     total  . Axillary artery cannulation via 8-mm Hemashield graft, median sternotomy, extracorporeal circulation with deep hypothermic circulatory arrest, repair of  aortic dessection  01/23/2009   Dr Arlyce Dice  . BACK SURGERY      x 5  . cataract     both eyes  . CHOLECYSTECTOMY    . COLONOSCOPY    . EYE SURGERY Bilateral    Cataract Extraction with IOL  . HEMORROIDECTOMY     and colon polyp removed  . KNEE ARTHROSCOPY WITH LATERAL RELEASE Right 11/15/2016   Procedure: KNEE ARTHROSCOPY WITH LATERAL RELEASE;  Surgeon: Corky Mull, MD;  Location: [facility] ORS;  Service: Orthopedics;  Laterality: Right;  . LIVER BIOPSY  04/10/2012   Procedure: LIVER BIOPSY;  Surgeon: Inda Castle, MD;  Location: [facility];  Service: Endoscopy;  Laterality: N/A;  ultrasound to mark order for abd limited/liver to be marked to be sent by [email or web address]   . LUMBAR FUSION  10/09  . LUMBAR WOUND DEBRIDEMENT  05/09/2011   Procedure: LUMBAR WOUND DEBRIDEMENT;  Surgeon: Dahlia Bailiff;  Location: [facility];  Service: Orthopedics;  Laterality: N/A;  IRRIGATION AND DEBRIDEMENT SPINAL WOUND  . POSTERIOR CERVICAL FUSION/FORAMINOTOMY    . ROTATOR CUFF REPAIR     left    Prior to Admission medications   Medication Sig Start Date End Date Taking? Authorizing Provider  acetaminophen (TYLENOL) 325 MG tablet Take 650 mg by mouth every 6 (six) hours as needed for mild pain.    [provider]  albuterol (PROVENTIL HFA;VENTOLIN HFA) 108 (90 Base) MCG/ACT inhaler Inhale 2 puffs into the lungs every 6 (six) hours as needed for wheezing or shortness of breath.    [provider]  albuterol (PROVENTIL) (2.5 MG/3ML) 0.083% nebulizer solution Take 2.5 mg by nebulization every 6 (six) hours as needed for wheezing or shortness of breath.    [provider]   budesonide-formoterol (SYMBICORT) 160-4.5 MCG/ACT inhaler Inhale 2 puffs into the lungs 2 (two) times daily.     [provider]  buPROPion (WELLBUTRIN XL) 150 MG 24 hr tablet Take 1 tablet (150 mg total) by mouth daily. 10/12/17   Ursula Alert, MD  clonazePAM (KLONOPIN) 0.5 MG tablet Take 0.5 mg by mouth 3 (three) times daily.     [provider]  Colloidal Oatmeal (EUCERIN ECZEMA RELIEF) 1 % CREA Apply 1 application topically 2 (two) times daily. Apply to right lower leg twice daily.    [provider]  diclofenac sodium (VOLTAREN) 1 % GEL Apply 2 g topically every 8 (eight) hours as needed (arthritis). Apply to right knee    [provider]  dicyclomine (BENTYL) 10 MG capsule Take 10 mg by mouth 3 (three) times daily.     [provider]  ferrous sulfate 325 (65 FE) MG tablet Take 325 mg by mouth 2 (two) times  daily.     [provider]  furosemide (LASIX) 80 MG tablet Take 80 mg by mouth.    [provider]  gabapentin (NEURONTIN) 600 MG tablet Take 600 mg by mouth 2 (two) times daily.    [provider]  guaiFENesin-codeine (ROBITUSSIN AC) 100-10 MG/5ML syrup Take 15 mLs by mouth every 6 (six) hours as needed for cough.    [provider]  hydrochlorothiazide (MICROZIDE) 12.5 MG capsule Take 12.5 mg by mouth daily with supper.    [provider]  HYDROcodone-acetaminophen (NORCO) 7.5-325 MG tablet Take 1 tablet by mouth every 6 (six) hours as needed for moderate pain. 11/16/17 12/16/17  Gillis Santa, MD  insulin glargine (LANTUS) 100 UNIT/ML injection Inject 50 Units into the skin every evening.     [provider]  insulin lispro (HUMALOG) 100 UNIT/ML injection Inject 8 Units into the skin 3 (three) times daily before meals.     [provider]  lactulose (CHRONULAC) 10 GM/15ML solution Take 10 g by mouth daily. (0800) 10/17/16   [provider]  lamoTRIgine (LAMICTAL) 100 MG  tablet Take 1 tablet (100 mg total) by mouth 2 (two) times daily. (0800 & 2000) 10/12/17   Ursula Alert, MD  lamoTRIgine (LAMICTAL) 25 MG tablet Take 1 tablet (25 mg total) by mouth daily. To be added to the Lamictal 100 mg in the AM 10/12/17   Eappen, Ria Clock, MD  lidocaine (LIDODERM) 5 % Place 2 patches onto the skin daily. Apply 1 patch to 2 areas on back (2 patches) every morning and remove at bedtime.    [provider]  liraglutide (VICTOZA) 18 MG/3ML SOPN Inject 1.8 mg into the skin daily.    [provider]  LYRICA 150 MG capsule Take 150 mg by mouth 3 (three) times daily. (0800, 1400 & 2000) 11/29/16   [provider]  metFORMIN (GLUCOPHAGE-XR) 500 MG 24 hr tablet Take 500 mg by mouth twice daily. 09/04/17   [provider]  metoCLOPramide (REGLAN) 5 MG tablet Take 5 mg by mouth 4 (four) times daily -  before meals and at bedtime. (0800, 1200, 1600 & 2000)    [provider]  neomycin-bacitracin-polymyxin (NEOSPORIN) [phone] ointment Apply 1 application topically every 12 (twelve) hours as needed (to right forearm).    [provider]  nortriptyline (PAMELOR) 10 MG capsule Take 2 capsules (20 mg total) by mouth at bedtime. For sleep, pain, anxiety 11/23/17   Ursula Alert, MD  nystatin (MYCOSTATIN/NYSTOP) powder Apply topically 4 (four) times daily. Patient taking differently: Apply topically 4 (four) times daily. Apply to inner thigh/ peri area twice daily and apply to abdomen 4 times daily. 08/07/17   Lequita Asal, MD  omeprazole (PRILOSEC) 20 MG capsule Take 20 mg by mouth daily.     [provider]  ondansetron (ZOFRAN) 4 MG tablet Take 4 mg by mouth every 4 (four) hours as needed for nausea or vomiting.    [provider]  oxybutynin (DITROPAN-XL) 5 MG 24 hr tablet Take 5 mg by mouth daily. (0800)    [provider]  potassium chloride (K-DUR,KLOR-CON) 10 MEQ tablet Take 1 tablet (10 mEq total) by mouth  daily for 4 days. 08/15/17 08/19/17  Harvest Dark, MD  rOPINIRole (REQUIP) 3 MG tablet Take 3 mg by mouth at bedtime. (2000)    [provider]  senna (SENOKOT) 8.6 MG tablet Take 2 tablets by mouth at bedtime.    [provider]  simvastatin (ZOCOR) 10 MG tablet Take 10 mg by mouth at bedtime. (2000)    [provider]  spironolactone (ALDACTONE) 50 MG tablet Take 50 mg by mouth 2 (two) times daily. (0800)    [provider]  vitamin C (ASCORBIC ACID) 500 MG tablet Take 500 mg by mouth 2 (two) times daily. (0800 & 2000)    [provider]    Allergies Doxycycline  Family History  Problem Relation Age of Onset  . Breast cancer Mother   . Lung cancer Mother   . Stomach cancer Father   . Diabetes Unknown        4 aunts, and 1 uncle  . Depression Brother   . Anxiety disorder Brother   . Colon cancer Neg Hx   . Esophageal cancer Neg Hx   . Rectal cancer Neg Hx     Social History Social History   Tobacco Use  . Smoking status: Former Smoker    Packs/day: 1.50    Years: 50.00    Pack years: 75.00    Types: Cigarettes    Last attempt to quit: 04/06/2013    Years since quitting: 4.6  . Smokeless tobacco: Never Used  Substance Use Topics  . Alcohol use: No    Alcohol/week: 0.0 oz  . Drug use: No     Review of Systems  Constitutional: No fever/chills Eyes: No visual changes. No discharge ENT: No upper respiratory complaints. Cardiovascular: no chest pain. Respiratory: no cough. No SOB. Gastrointestinal: No abdominal pain.  No nausea, no vomiting.  No diarrhea.  No constipation. Genitourinary: Negative for dysuria. No hematuria Musculoskeletal: Patient has left knee pain.  Skin: Negative for rash, abrasions, lacerations, ecchymosis. Neurological: Patient has headache, no focal weakness or numbness.   ____________________________________________   PHYSICAL EXAM:  VITAL SIGNS: ED Triage Vitals  Enc Vitals Group     BP  11/30/17 1927 (!) 114/53     Pulse Rate 11/30/17 1927 87     Resp 11/30/17 1927 18     Temp 11/30/17 1927 98 F (36.7 C)     Temp Source 11/30/17 1927 Oral     SpO2 11/30/17 1927 97 %     Weight 11/30/17 1931 222 lb (100.7 kg)     Height 11/30/17 1931 5' 3"  (1.6 m)     Head Circumference --      Peak Flow --      Pain Score 11/30/17 2012 10     Pain Loc --      Pain Edu? --      Excl. in [place]? --      Constitutional: Alert and oriented. Well appearing and in no acute distress. Eyes: Conjunctivae are normal. PERRL. EOMI. Head: Atraumatic. ENT:      Ears: TMs are pearly without hemorrhagic effusion.  No discharge from the external auditory canals bilaterally.  No ecchymosis is visualized by the pinna bilaterally.      Nose: No congestion/rhinnorhea.      Mouth/Throat: Mucous membranes are moist.  Neck: No stridor.  No cervical spine tenderness to palpation. Cardiovascular: Normal rate, regular rhythm. Normal S1 and S2.  Good peripheral circulation. Respiratory: Normal respiratory effort without tachypnea or retractions. Lungs CTAB. Good air entry to the bases with no decreased or absent breath sounds. Gastrointestinal: Bowel sounds 4 quadrants. Soft and nontender to palpation. No guarding or rigidity. No palpable masses. No distention. No CVA tenderness. Musculoskeletal: Full range of motion to all extremities. No gross deformities appreciated. Neurologic:  Normal speech and language. No gross focal neurologic deficits are appreciated.  Skin:  Skin is warm, dry and intact. No rash noted. Psychiatric: Mood and affect are normal. Speech and behavior are normal. Patient exhibits appropriate insight and judgement.   ____________________________________________   LABS (all labs ordered are listed, but only abnormal results are displayed)  Labs Reviewed - No data to  display ____________________________________________  EKG   ____________________________________________  RADIOLOGY I personally viewed and evaluated these images as part of my medical decision making, as well as reviewing the written report by the radiologist.  Ct Head Wo Contrast  Result Date: 11/30/2017 CLINICAL DATA:  Witnessed fall from bed hitting left side of head on table. Headache. EXAM: CT HEAD WITHOUT CONTRAST CT CERVICAL SPINE WITHOUT CONTRAST TECHNIQUE: Multidetector CT imaging of the head and cervical spine was performed following the standard protocol without intravenous contrast. Multiplanar CT image reconstructions of the cervical spine were also generated. COMPARISON:  11/05/2017, 07/05/2013 CT exams FINDINGS: CT HEAD FINDINGS Brain: Age related involutional changes of the brain with chronic small vessel ischemic disease. No acute intracranial hemorrhage, midline shift or edema. No large vascular territory infarct. Vascular: No hyperdense vessel sign. Atherosclerosis of the internal carotids. Skull: No skull fracture. Sinuses/Orbits: Intact Other: None CT CERVICAL SPINE FINDINGS Alignment: Straightening of cervical lordosis likely from patient positioning or muscle spasm. Intact atlantodental interval and craniocervical relationship. Slight anterolisthesis of C3 on C4 likely degenerative disc and facet mediated. Skull base and vertebrae: ACDF of the cervical spine from C5 through C7. No hardware failure. Soft tissues and spinal canal: No prevertebral fluid or swelling. No visible canal hematoma. Disc levels: No significant central canal or foraminal encroachment. Upper chest: Stable small subpleural opacity in the posterior right upper lobe unchanged since neck CT from 2015. Other: None IMPRESSION: No acute intracranial abnormality. No acute cervical spine fracture or listhesis. Intact ACDF from C5 through C7. Electronically Signed   By: Ashley Royalty M.D.   On: 11/30/2017 19:59   Ct  Cervical Spine Wo Contrast  Result Date: 11/30/2017 CLINICAL DATA:  Witnessed fall from bed hitting left side of head on table. Headache. EXAM: CT HEAD WITHOUT CONTRAST CT CERVICAL SPINE WITHOUT CONTRAST TECHNIQUE: Multidetector CT imaging of the head and cervical spine was performed following the standard protocol without intravenous contrast. Multiplanar CT image reconstructions of the cervical spine were also generated. COMPARISON:  11/05/2017, 07/05/2013 CT exams FINDINGS: CT HEAD FINDINGS Brain: Age related involutional changes of the brain with chronic small vessel ischemic disease. No acute intracranial hemorrhage, midline shift or edema. No large vascular territory infarct. Vascular: No hyperdense vessel sign. Atherosclerosis of the internal carotids. Skull: No skull fracture. Sinuses/Orbits: Intact Other: None CT CERVICAL SPINE FINDINGS Alignment: Straightening of cervical lordosis likely from patient positioning or muscle spasm. Intact atlantodental interval and craniocervical relationship. Slight anterolisthesis of C3 on C4 likely degenerative disc and facet mediated. Skull base and vertebrae: ACDF of the cervical spine from C5 through C7. No hardware failure. Soft tissues and spinal canal: No prevertebral fluid or swelling. No visible canal hematoma. Disc levels: No significant central canal or foraminal encroachment. Upper chest: Stable small subpleural opacity in the posterior right upper lobe unchanged since neck CT from 2015. Other: None IMPRESSION: No acute intracranial abnormality. No acute cervical spine fracture or listhesis. Intact ACDF from C5 through C7. Electronically Signed   By: Ashley Royalty M.D.   On: 11/30/2017 19:59   Dg Knee Complete 4 Views Left  Result Date:  11/30/2017 CLINICAL DATA:  Pt arrived via EMS from the [place], post witnessed roll off of bed to floor, hitting left side of head on table. Pt c/o pain to left side of forehead and left knee pain. Pt has chronic left knee pain.  No deformity noted to knee EXAM: LEFT KNEE - COMPLETE 4+ VIEW COMPARISON:  02/15/2017 FINDINGS: There are mild degenerative changes in the patellofemoral compartment. No acute fracture or subluxation. No joint effusion. IMPRESSION: No evidence for acute  abnormality.  Mild degenerative changes. Electronically Signed   By: Nolon Nations M.D.   On: 11/30/2017 20:14    ____________________________________________    PROCEDURES  Procedure(s) performed:    Procedures    Medications  acetaminophen (TYLENOL) tablet 650 mg (650 mg Oral Given 11/30/17 2046)     ____________________________________________   INITIAL IMPRESSION / ASSESSMENT AND PLAN / ED COURSE  Pertinent labs & imaging results that were available during my care of the patient were reviewed by me and considered in my medical decision making (see chart for details).  Review of the [place] CSRS was performed in accordance of the [place] prior to dispensing any controlled drugs.    Assessment and plan Fall Patient presents to the emergency department after rolling out of her bed accidentally.  Patient complained of headache and left knee pain.  CT head and cervical spine revealed no acute fracture or evidence of acute intracranial abnormality.  Patient was given Tylenol for pain and advised to follow-up with primary care.  All patient questions were answered.   ____________________________________________  FINAL CLINICAL IMPRESSION(S) / ED DIAGNOSES  Final diagnoses:  Fall, initial encounter      NEW MEDICATIONS STARTED DURING THIS VISIT:  ED Discharge Orders    None          This chart was dictated using voice recognition software/Dragon. Despite best efforts to proofread, errors can occur which can change the meaning. Any change was purely unintentional.    Karren Cobble 11/30/17 2157    Rudene Re, MD 12/02/17 1540

## 2017-11-30 NOTE — ED Triage Notes (Signed)
Pt arrived via EMS from the [place], post witnessed roll off of bed to floor, hitting left side of head on table. Pt c/o pain to left side of forehead and left knee pain. Pt has chronic left knee pain. No deformity noted to knee. Small red area to the left side of forehead.

## 2017-12-09 NOTE — Progress Notes (Signed)
THERAPIST PROGRESS NOTE  Session Time: 60min  Participation Level: Active  Behavioral Response: CasualAlertEuthymic  Type of Therapy: Individual Therapy  Treatment Goals addressed: Coping  Interventions: CBT and Motivational Interviewing  Summary: Stephanie [name] is a 67 y.o. female who presents with continued symptoms of her diagnosis.  Therapist met with Patient in an outpatient setting to assess current mood and assist with making progress towards goals through the use of therapeutic intervention. Therapist did a brief mood check, assessing anger, fear, disgust, excitement, happiness, and sadness.  Patient reports that she is sad more than she is happy. She reports continued difficulty with her son's death.  Review of the Stages of Grief.  Asked patient if she has been able to review the previous discussion and what stages she feels that she is on. Explored grief and loss groups in the county.  Encouraged patient to attend the meeting as well as individual sessions. Stressed the importance of mood stabilization through learned coping skills and medication management.  Encouraged Patient to focus on her strengths and the positive aspects.    Suicidal/Homicidal: No  Plan: Return again in2 weeks.  Diagnosis: Axis I: Bipolar, mixed    Axis II: No diagnosis     M , LCSW 11/07/2017  

## 2017-12-11 ENCOUNTER — Ambulatory Visit: Payer: Medicaid Other | Admitting: Licensed Clinical Social Worker

## 2017-12-12 IMAGING — CT CT CERVICAL SPINE W/O CM
3 of 8 series · 11 of 33 positions shown, 12 images · non-contrast
Comparison: CT head without contrast 03/07/2017. MRI brain
03/08/2017. CTA head and neck 03/07/2017.

CLINICAL DATA: Fell out of bed. Hit head on night stand. Laceration
to the left side of the face. Headache and neck pain.

EXAM:
CT HEAD WITHOUT CONTRAST
CT CERVICAL SPINE WITHOUT CONTRAST
TECHNIQUE: Multidetector CT imaging of the head and cervical spine was
performed following the standard protocol without intravenous
contrast. Multiplanar CT image reconstructions of the cervical spine
were also generated.

[Series 5: sagittal bone · sagittal · 0.24mm/px · 5 of 81 slices shown]
[im 12/81  bone]
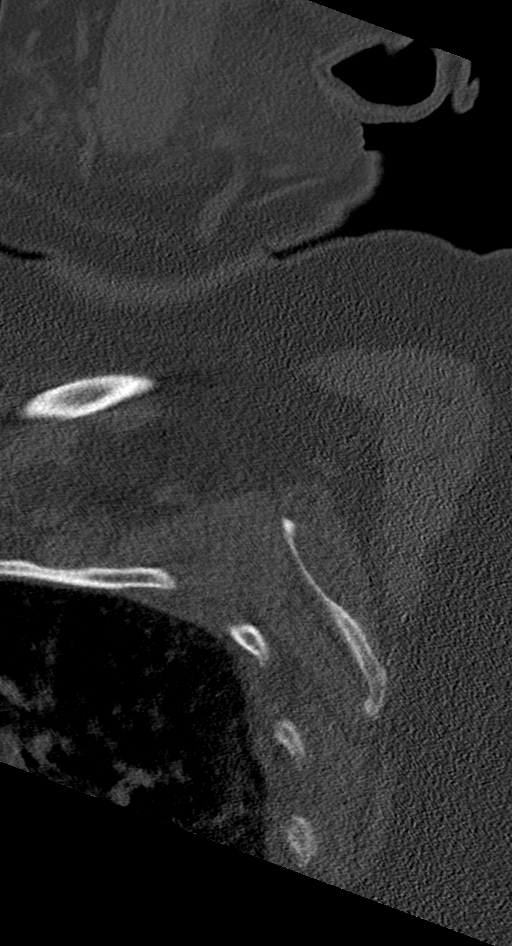
[im 23/81  bone]
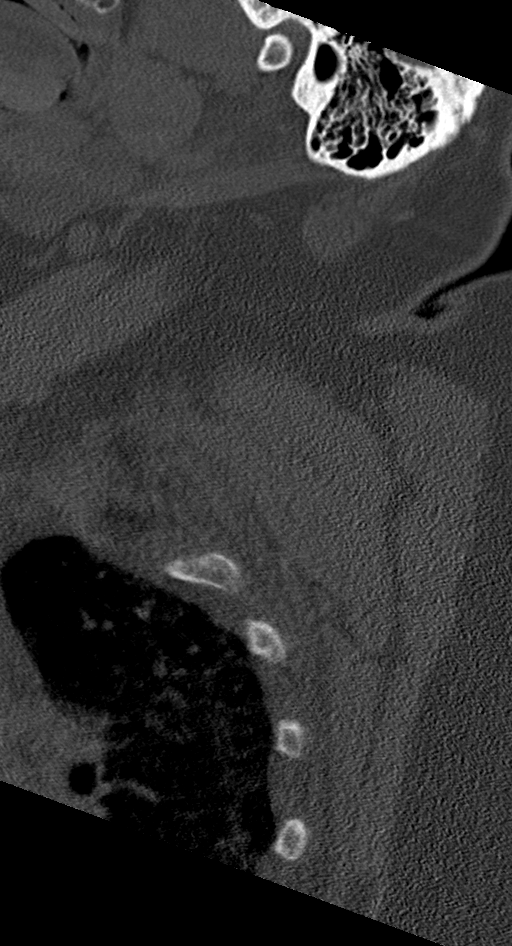
[im 35/81  bone]
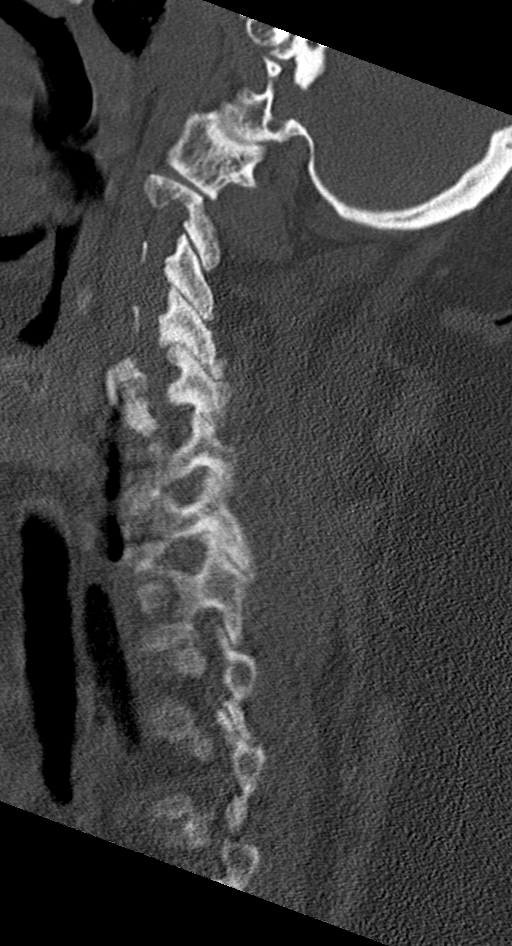
[im 46/81  bone]
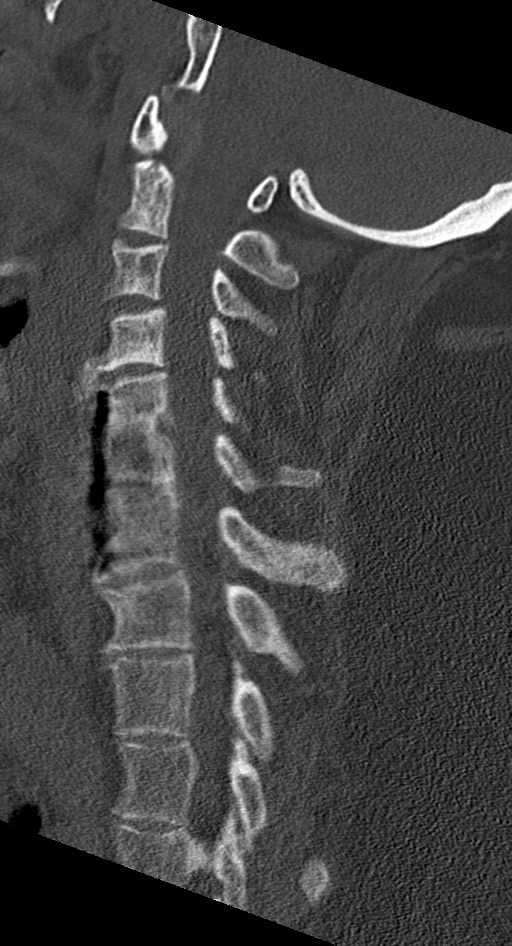
[im 58/81  bone]
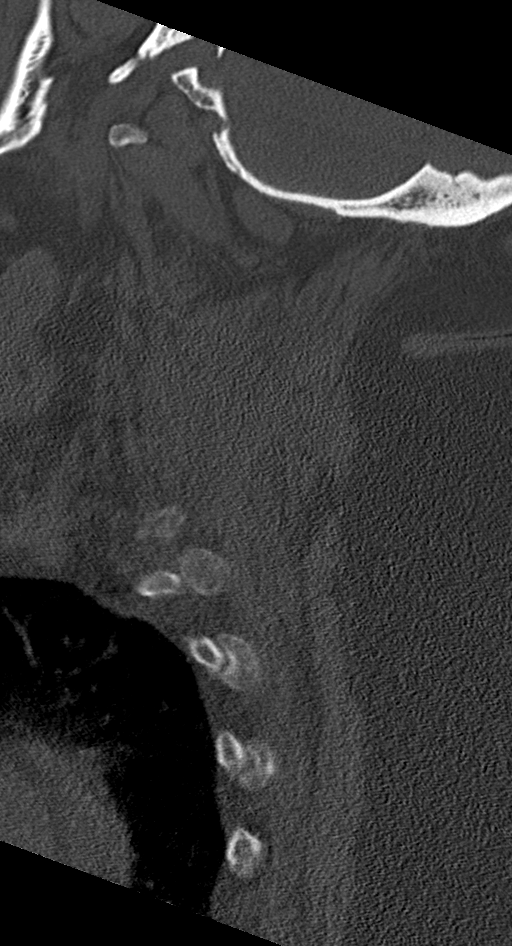

[Series 7: coronal bone · coronal · 0.31mm/px · 3 of 63 slices shown]
[im 16/63  bone]
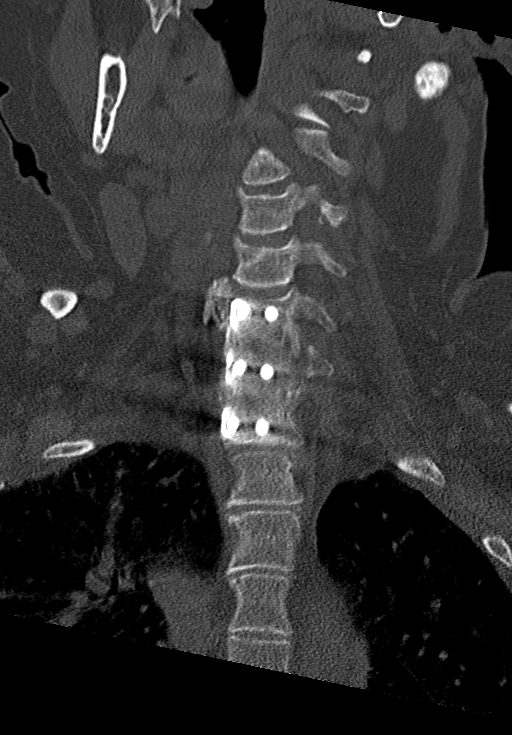
[im 32/63  bone]
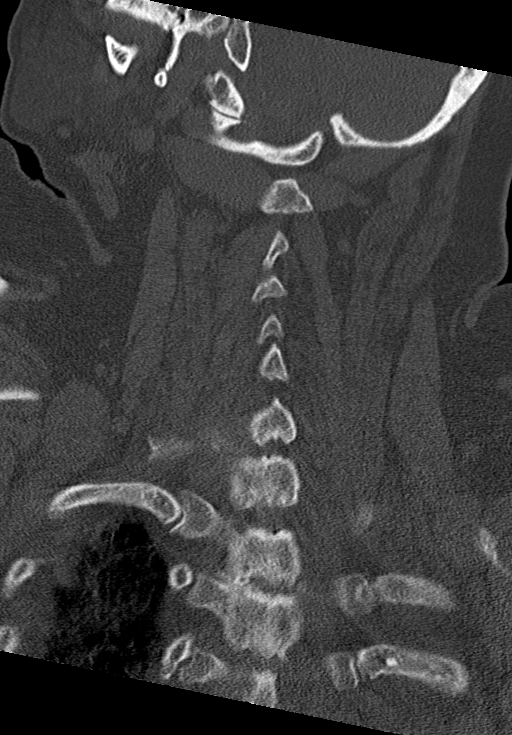
[im 47/63  bone]
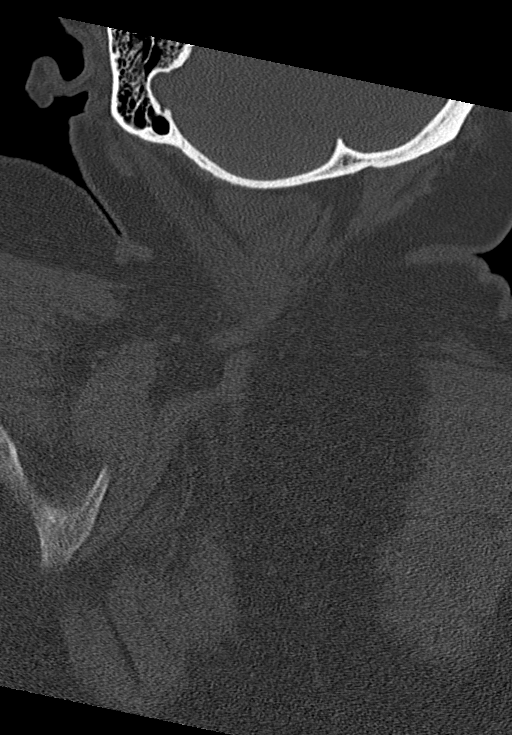

[Series 8: orthogonal bone · axial · 0.24mm/px · z∈[-459,-332]mm · 3 of 116 slices shown, 4 images]
[im 24/116  soft-tissue]
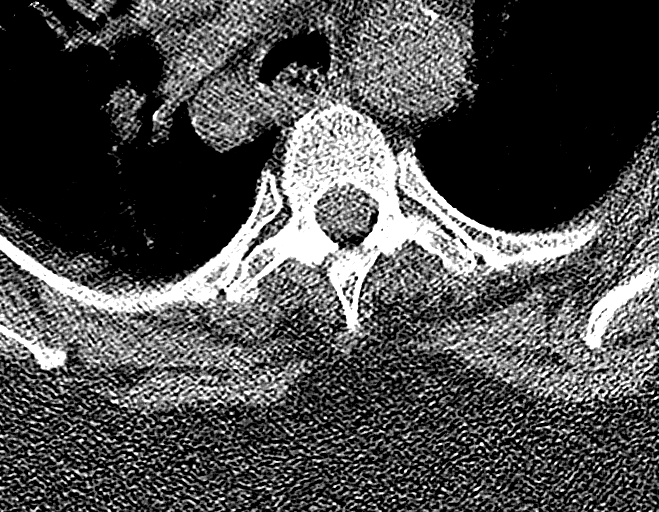
[im 24/116  bone]
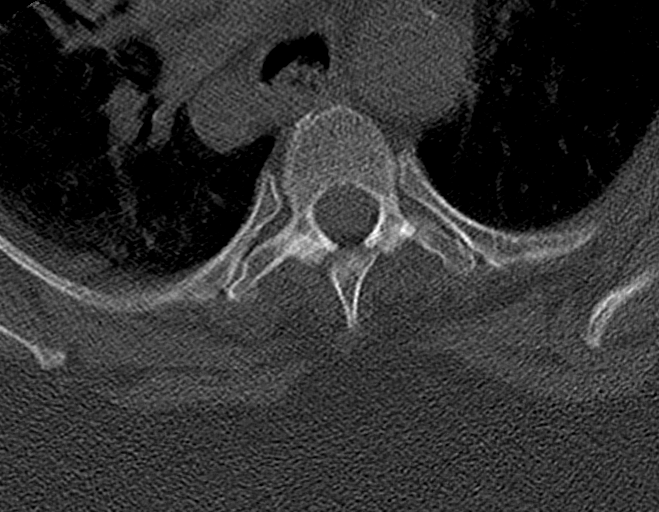
[im 70/116  bone]
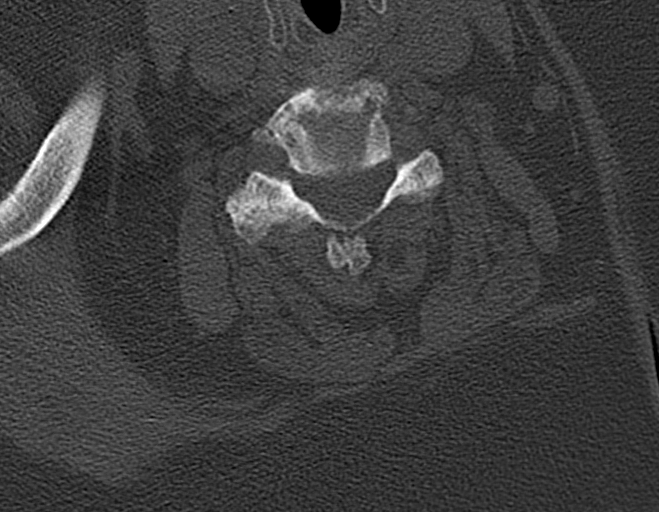
[im 93/116  bone]
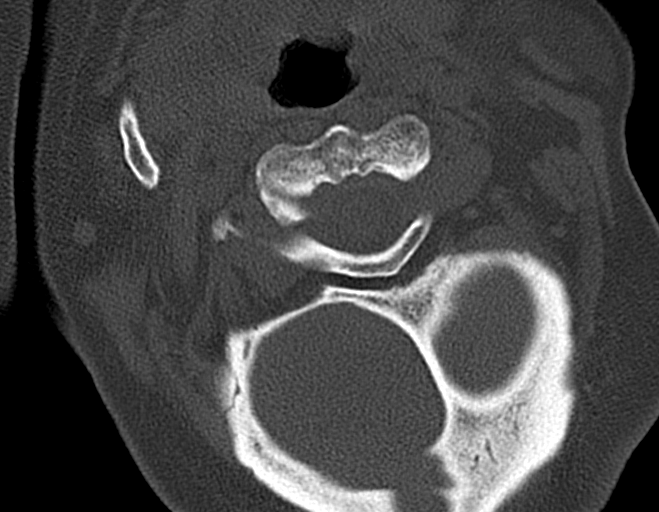

[11 of 33 positions shown; findings below may reference images not displayed]

FINDINGS: CT HEAD FINDINGS

Brain: No acute infarct, hemorrhage, or mass lesion is present. The
ventricles are of normal size. No significant white matter disease
is present. The brainstem and cerebellum are normal. No significant
extraaxial fluid collection is present.

Vascular: Atherosclerotic calcifications are present in the
cavernous internal carotid artery is bilaterally and at the dural
margin of the right vertebral artery. There is no hyperdense vessel.

Skull: The a left parietal scalp laceration is present. A a small
hematoma is noted. There is no underlying fracture or radiopaque
foreign body. Extracranial soft tissues are otherwise within normal
limits.

Sinuses/Orbits: The paranasal sinuses are clear. Nasal cavity is
opacified bilaterally. Bilateral lens replacements are present.
Globes and orbits are within normal limits bilaterally.

CT CERVICAL SPINE FINDINGS

Alignment: AP alignment is anatomic.

Skull base and vertebrae: The craniocervical junction is normal.
Solid ACDF at C5-6 and C6-7 is intact. No acute fracture is present.

Soft tissues and spinal canal: The soft tissues the neck demonstrate
mild atherosclerotic changes of the right carotid bifurcation
without definite stenosis. No other focal lesions are present. There
is no significant adenopathy.

Disc levels: Adjacent level disease is present at C4-5 with
asymmetric right-sided facet hypertrophy and right foraminal
narrowing. Asymmetric left-sided facet hypertrophy and uncovertebral
spurring is present at C3-4 with moderate left foraminal stenosis.
Bilateral facet hypertrophy is present at C7-T1.

Upper chest: Asymmetric edema is present in the right upper lobe.
Atherosclerotic changes are present at the origins of the great
vessels.
IMPRESSION: 1. Left parietal scalp laceration without underlying fracture.
2. Normal CT appearance of the brain for age.
3. Bilateral nasal cavity opacification without significant sinus
disease. This likely represents diffuse mucosal thickening.
4. Stable cervical spine fusion without evidence for acute fracture
or traumatic subluxation to the cervical spine.
5. Degenerative changes in the cervical spine most pronounced on the
left at C3-4 and on the right at C4-5.
6. Aortic atherosclerosis.

## 2017-12-12 IMAGING — CT CT ABD-PELV W/O CM
2 of 4 series · 13 of 36 positions shown, 16 images · non-contrast
Comparison: CT chest 06/28/2016. CT chest, abdomen, and pelvis
12/09/2015.

CLINICAL DATA: Fell out of bed. Left face and aspiration. Rib
fracture suspected, post trauma.

EXAM:
CT CHEST, ABDOMEN AND PELVIS WITHOUT CONTRAST
TECHNIQUE: Multidetector CT imaging of the chest, abdomen and pelvis was
performed following the standard protocol without IV contrast.

[Series 2: cap wo st · axial · 0.77mm/px · z∈[-939,-389]mm · 10 of 132 slices shown, 13 images]
[im 11/132  mediastinal]
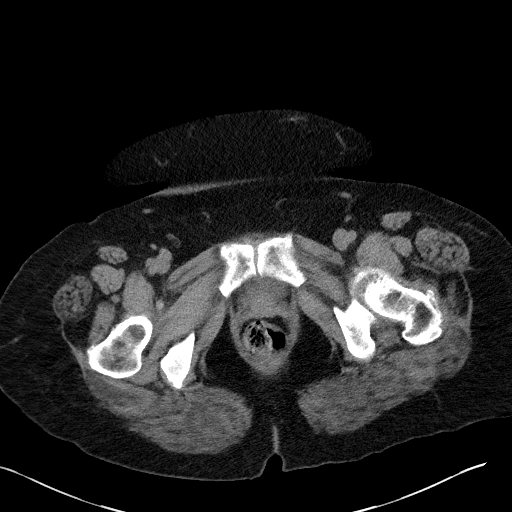
[im 11/132  lung]
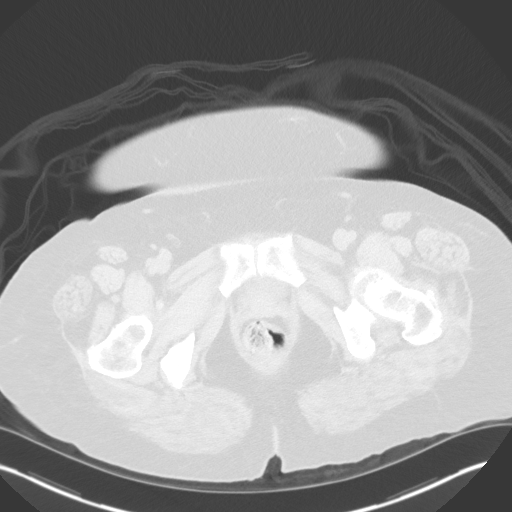
[im 22/132  lung]
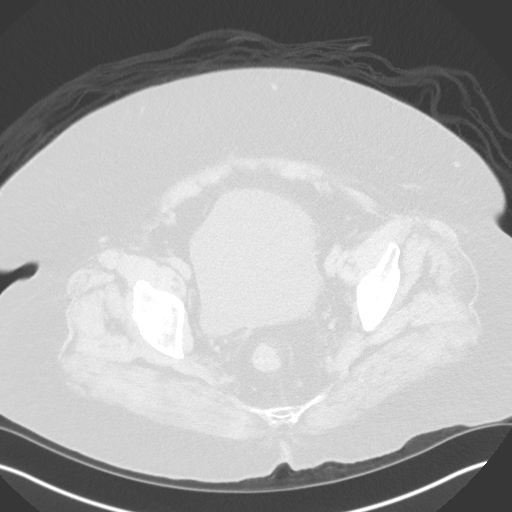
[im 33/132  lung]
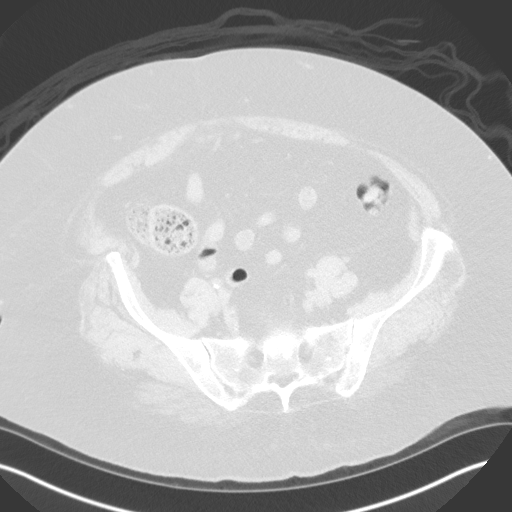
[im 44/132  lung]
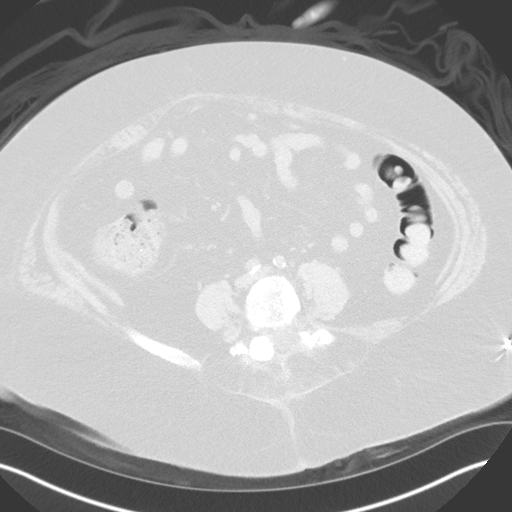
[im 55/132  mediastinal]
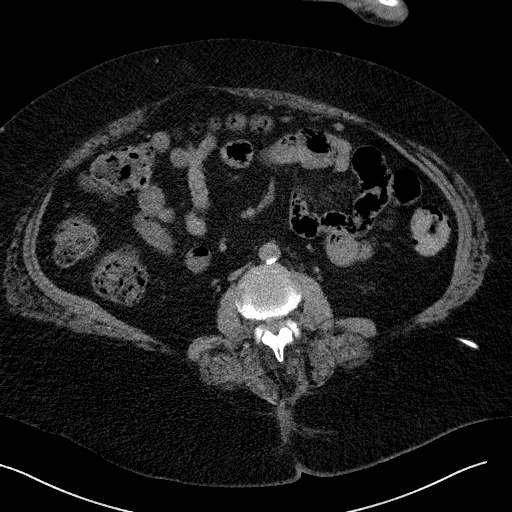
[im 55/132  lung]
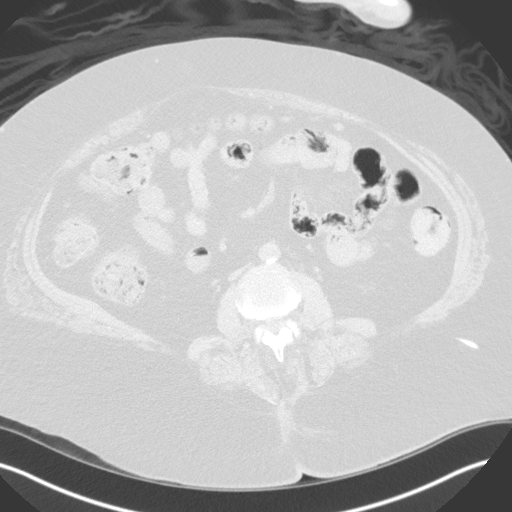
[im 77/132  lung]
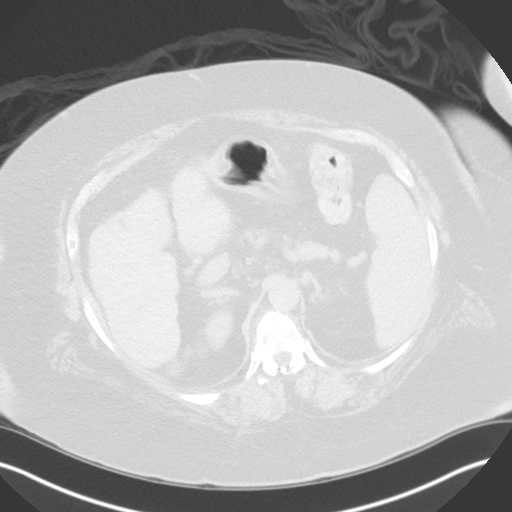
[im 88/132  lung]
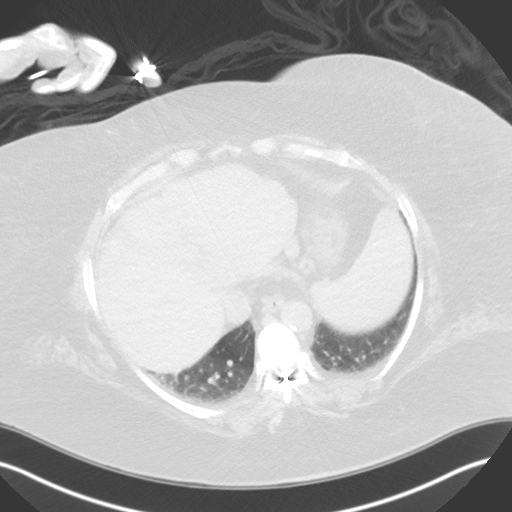
[im 99/132  lung]
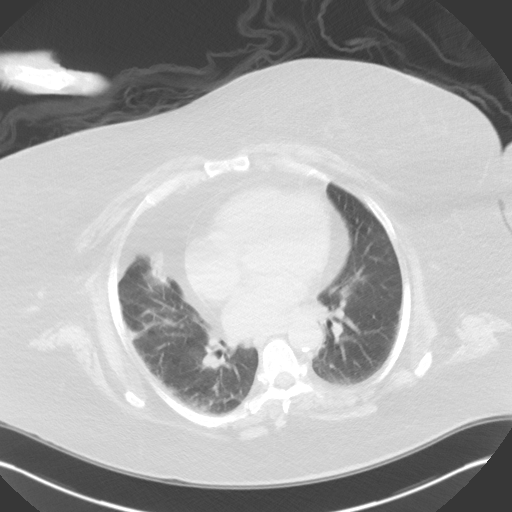
[im 110/132  mediastinal]
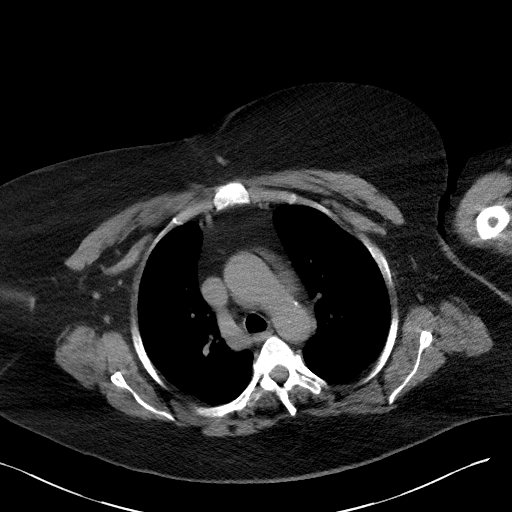
[im 110/132  lung]
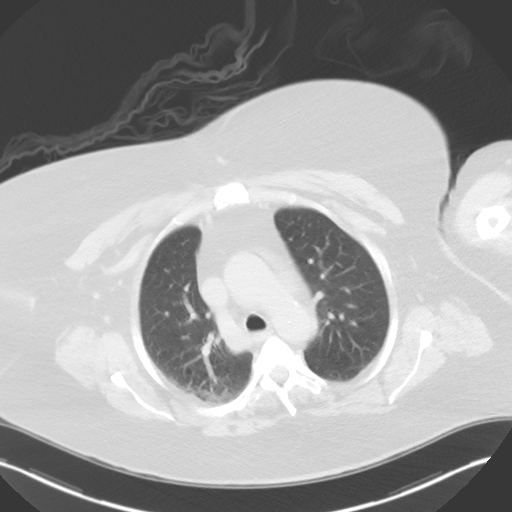
[im 121/132  lung]
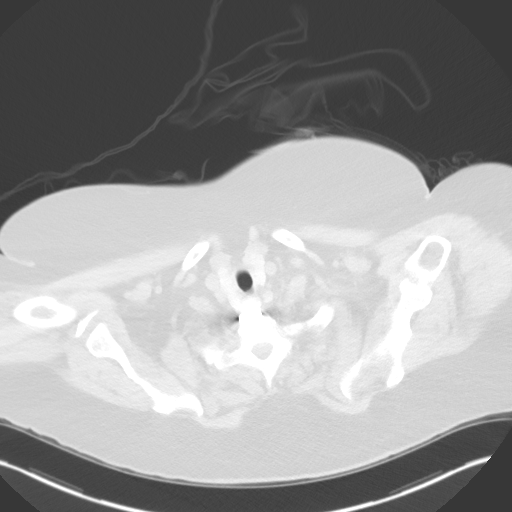

[Series 5: coronal · coronal · 0.91mm/px · 3 of 164 slices shown]
[im 33/164  lung]
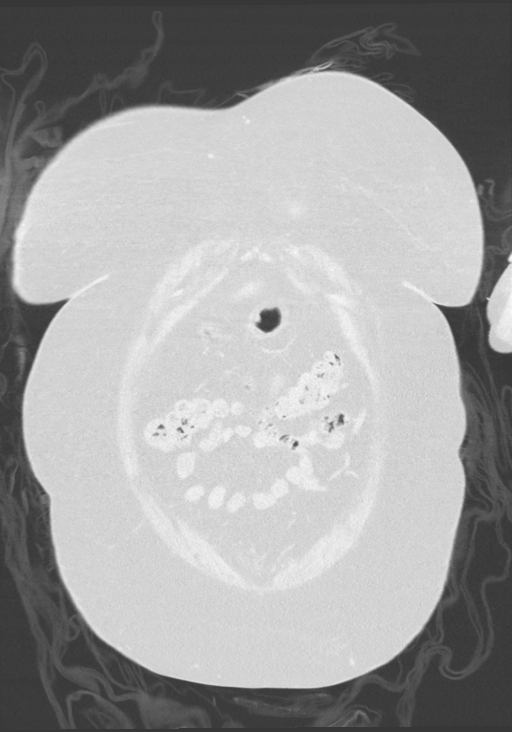
[im 66/164  lung]
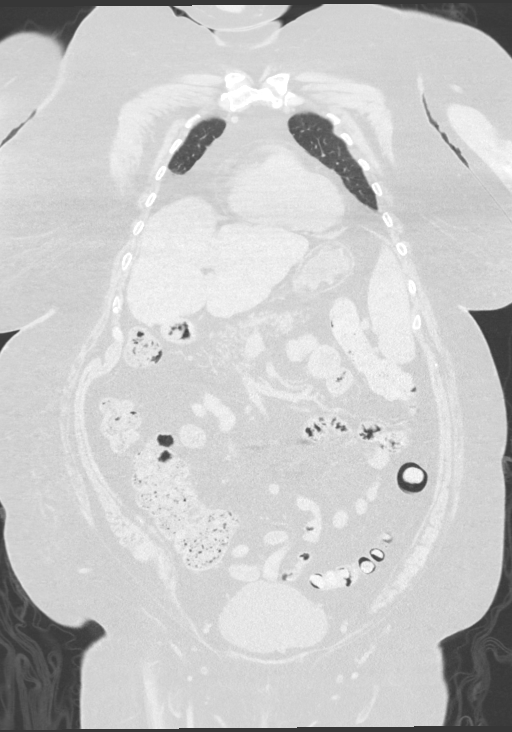
[im 98/164  lung]
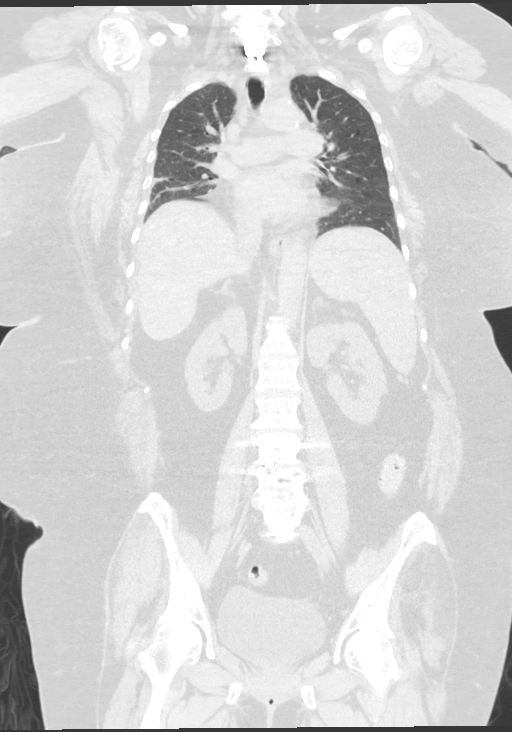

[13 of 36 positions shown; findings below may reference images not displayed]

FINDINGS: CT CHEST FINDINGS

Cardiovascular: The heart is mildly enlarged. Coronary artery
calcifications are present. Calcifications are present at the
origins the great vessels and within the descending thoracic aorta.
Pulmonary artery is are unremarkable.

Mediastinum/Nodes: No significant mediastinal or hilar adenopathy is
present. No significant axillary adenopathy is present. The thoracic
inlet is within normal limits. The esophagus is unremarkable.

Lungs/Pleura: Asymmetric peripheral airspace disease is present in
the right lung. No discrete nodule or mass lesion is present. There
is no pneumothorax. Minimal atelectasis is present in the left lung.
Centrilobular emphysema is present.

Musculoskeletal: Ribs are intact. Twelve rib-bearing thoracic type
vertebral bodies are present. Vertebral body heights are maintained.
No acute fractures are present. Dorsal spinal cord stimulator is
stable, terminating at T8-9

CT ABDOMEN PELVIS FINDINGS

Hepatobiliary: Cirrhotic changes of the liver are again noted. No
discrete mass lesion is present. There is marked nodularity. The
common bile duct is within normal limits. Gallbladder is surgically
absent.

Pancreas: Unremarkable. No pancreatic ductal dilatation or
surrounding inflammatory changes.

Spleen: Splenomegaly is again noted. This suggests portal venous
hypertension.

Adrenals/Urinary Tract: Adrenal glands are normal bilaterally.
Kidneys and ureters are within normal limits. The urinary bladder is
normal.

Stomach/Bowel: The stomach and duodenum are within normal limits.
Small bowel is unremarkable. Terminal ileum is within normal limits.
Appendix is visualized and normal. The ascending and transverse
colon are normal. Descending and sigmoid colon are within normal
limits.

Vascular/Lymphatic: Extensive atherosclerotic calcifications are
again noted in the in descending thoracic and abdominal aorta and
branch vessels without aneurysm.

Reproductive: Status post hysterectomy. No adnexal masses.

Other: No abdominal wall hernia or abnormality. No abdominopelvic
ascites.

Musculoskeletal: Fusion at L3-4, L4-5, and L5-S1 is stable.
Degenerative changes are again noted at the SI joints. No acute
fractures are present. Hips are located and within normal limits
bilaterally.

No significant soft tissue injury is present.
IMPRESSION: 1. Asymmetric peripheral airspace opacities in the right lung may
represent contusion. Focal airspace disease is considered less
likely.
2. No associated rib fracture or pneumothorax.
3. No other acute trauma to the chest, abdomen, or pelvis.
4.  Aortic Atherosclerosis ([id number]).
5.  Emphysema ([id number]).
6. Cirrhosis with associated splenomegaly.
7. Spinal cord stimulator.
8. Fusion of the lower lumbar spine at L3-4, L4-5, and L5-S1.

## 2017-12-31 ENCOUNTER — Emergency Department: Payer: Medicare Other

## 2017-12-31 ENCOUNTER — Encounter: Payer: Self-pay | Admitting: Emergency Medicine

## 2017-12-31 ENCOUNTER — Emergency Department
Admission: EM | Admit: 2017-12-31 | Discharge: 2017-12-31 | Payer: Medicare Other | Attending: Emergency Medicine | Admitting: Emergency Medicine

## 2017-12-31 DIAGNOSIS — Z8673 Personal history of transient ischemic attack (TIA), and cerebral infarction without residual deficits: Secondary | ICD-10-CM | POA: Diagnosis not present

## 2017-12-31 DIAGNOSIS — Y92009 Unspecified place in unspecified non-institutional (private) residence as the place of occurrence of the external cause: Secondary | ICD-10-CM

## 2017-12-31 DIAGNOSIS — M79605 Pain in left leg: Secondary | ICD-10-CM | POA: Insufficient documentation

## 2017-12-31 DIAGNOSIS — Z79899 Other long term (current) drug therapy: Secondary | ICD-10-CM | POA: Insufficient documentation

## 2017-12-31 DIAGNOSIS — E119 Type 2 diabetes mellitus without complications: Secondary | ICD-10-CM | POA: Diagnosis not present

## 2017-12-31 DIAGNOSIS — Z87891 Personal history of nicotine dependence: Secondary | ICD-10-CM | POA: Diagnosis not present

## 2017-12-31 DIAGNOSIS — W19XXXA Unspecified fall, initial encounter: Secondary | ICD-10-CM

## 2017-12-31 DIAGNOSIS — W010XXA Fall on same level from slipping, tripping and stumbling without subsequent striking against object, initial encounter: Secondary | ICD-10-CM | POA: Insufficient documentation

## 2017-12-31 MED ORDER — KETOROLAC TROMETHAMINE 30 MG/ML IJ SOLN
30.0000 mg | Freq: Once | INTRAMUSCULAR | Status: AC
  Administered 2017-12-31: 30 mg via INTRAMUSCULAR
  Filled 2017-12-31: qty 1

## 2017-12-31 NOTE — ED Notes (Signed)
Pt had fall last night, no loss of consciousness, pt c/o hip pain and knee pain on the left

## 2017-12-31 NOTE — ED Notes (Signed)
Transported to XR  

## 2017-12-31 NOTE — ED Triage Notes (Signed)
Patient presents to the ED from "the [facility]".  Patient fell yesterday evening and is complaining of left leg pain.  Patient has history of frequent falls and is in a wheelchair at baseline.  Patient states she was trying to get by her roommate's walker and she attempted to pick it up and move it and she fell.  Patient denies hitting her head or passing out.  Patient states pain is in upper left leg between knee and hip.

## 2017-12-31 NOTE — ED Provider Notes (Signed)
[facility] Emergency Department Provider Note   ____________________________________________   First MD Initiated Contact with Patient 12/31/17 1807     (approximate)  I have reviewed the triage vital signs and the nursing notes.   HISTORY  Chief Complaint Fall    HPI KISSY CIELO is a 67 y.o. female patient complain of pain to the left hip and left knee secondary to a fall yesterday.  Patient is basically wheelchair bound.  Patient states she was trying to get to pass her roommate's walker and fell.  Patient denies LOC or head injury.  Patient rates the pain as a 9/10.  Patient described the pain is "aching".  Patient is nonweightbearing.  Patient has a history of multiple falls this year.  Patient is in a nursing home.  No palliative measures for complaint.  Past Medical History:  Diagnosis Date  . Adenomatous colon polyp 08/27/2014   Polyps x 3  . Anemia   . Anxiety   . 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 Arthritis   . Asthma   . Bipolar affective disorder ([place])    h/o  . Blood transfusion 2000  . Blood transfusion without reported diagnosis   . Cancer ([place])   . Chronic abdominal pain    abdominal wall pain  . 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 Colon polyp   . Depression   . Diabetes mellitus without complication ([place])   . Fatty liver 04/09/08   found in abd CT  . GERD (gastroesophageal reflux disease)   . H/O: CVA (cardiovascular accident)    TIA  . H/O: rheumatic fever   . Hepatitis    HEP "B" twenty years ago  . Hyperlipidemia   . Incontinence   . Insomnia   . Lower extremity edema   . Obstructive sleep apnea    Use C-PAP twice per week  . Platelets decreased ([place])   . RLS (restless legs syndrome)   . Shortness of breath   . Sleep apnea   . Stroke [facility])    TIA- 2002  . Ulcer   . Unspecified disorders of nervous system     Patient Active Problem List   Diagnosis Date Noted  . Tricompartment osteoarthritis of  right knee 10/18/2017  . Sepsis ([place]) 07/20/2017  . Obesity, Class III, BMI 40-49.9 (morbid obesity) ([place]) 05/19/2017  . Cervical stenosis of spine 04/25/2017  . Stress fracture of femur 04/17/2017  . Elevated alkaline phosphatase level 04/16/2017  . Altered mental status 03/07/2017  . Degenerative tear of lateral meniscus of right knee 11/15/2016  . Encephalopathy 07/19/2016  . Degenerative tear of medial meniscus of right knee 07/01/2016  . Primary osteoarthritis of right knee 07/01/2016  . Diabetes mellitus type 1, uncontrolled, without complications ([place]) 40/81/4481  . Lymphedema 06/14/2016  . Pain in limb 05/06/2016  . Swelling of limb 05/06/2016  . Postphlebitic syndrome with inflammation 05/06/2016  . Pneumonia 04/18/2016  . Chest pain 04/04/2016  . Iron deficiency anemia 01/30/2016  . Bilateral carotid artery stenosis 01/12/2016  . Hypoglycemia 09/09/2015  . Hyperglycemia 07/16/2015  . Neuropathy 06/08/2015  . Thrombocytopenia ([place]) 08/19/2014  . Abdominal pain, chronic, epigastric 06/12/2014  . Liver cirrhosis secondary to NASH ([place]) 02/27/2014  . Dizziness and giddiness 10/30/2013  . DOE (dyspnea on exertion) 10/30/2013  . Poorly controlled type 2 diabetes mellitus with complication ([place]) 85/63/1497  . DNR (do not resuscitate) 07/29/2013  . Encounter for routine gynecological  examination 07/29/2013  . Dyspnea 07/10/2013  . Lung nodule 07/10/2013  . Varicose veins of left lower extremity 10/09/2012  . GERD (gastroesophageal reflux disease) 10/09/2012  . Lower extremity edema 02/02/2012  . Cirrhosis of liver ([place]) 02/02/2012  . Anxiety 07/11/2011  . Aortic dissection ([place])   . BACK PAIN, LUMBAR, CHRONIC 11/19/2009  . UNS ADVRS EFF OTH RX MEDICINAL&BIOLOGICAL SBSTNC 12/16/2008  . UNSPECIFIED VITAMIN D DEFICIENCY 09/26/2008  . TOBACCO ABUSE 06/19/2008  . RESTLESS LEGS SYNDROME 04/27/2007  . INSOMNIA 04/27/2007  . HLD (hyperlipidemia) 03/08/2007  . OSA (obstructive  sleep apnea) 03/08/2007  . Essential hypertension 03/08/2007  . Asthma 03/08/2007  . BIPOLAR AFFECTIVE DISORDER, HX OF 03/08/2007    Past Surgical History:  Procedure Laterality Date  . ABDOMINAL EXPLORATION SURGERY    . ABDOMINAL HYSTERECTOMY     total  . Axillary artery cannulation via 8-mm Hemashield graft, median sternotomy, extracorporeal circulation with deep hypothermic circulatory arrest, repair of  aortic dessection  01/23/2009   Dr Arlyce Dice  . BACK SURGERY      x 5  . cataract     both eyes  . CHOLECYSTECTOMY    . COLONOSCOPY    . EYE SURGERY Bilateral    Cataract Extraction with IOL  . HEMORROIDECTOMY     and colon polyp removed  . KNEE ARTHROSCOPY WITH LATERAL RELEASE Right 11/15/2016   Procedure: KNEE ARTHROSCOPY WITH LATERAL RELEASE;  Surgeon: Corky Mull, MD;  Location: [facility] ORS;  Service: Orthopedics;  Laterality: Right;  . LIVER BIOPSY  04/10/2012   Procedure: LIVER BIOPSY;  Surgeon: Inda Castle, MD;  Location: [facility];  Service: Endoscopy;  Laterality: N/A;  ultrasound to mark order for abd limited/liver to be marked to be sent by [email or web address]   . LUMBAR FUSION  10/09  . LUMBAR WOUND DEBRIDEMENT  05/09/2011   Procedure: LUMBAR WOUND DEBRIDEMENT;  Surgeon: Dahlia Bailiff;  Location: [facility];  Service: Orthopedics;  Laterality: N/A;  IRRIGATION AND DEBRIDEMENT SPINAL WOUND  . POSTERIOR CERVICAL FUSION/FORAMINOTOMY    . ROTATOR CUFF REPAIR     left    Prior to Admission medications   Medication Sig Start Date End Date Taking? Authorizing Provider  acetaminophen (TYLENOL) 325 MG tablet Take 650 mg by mouth every 6 (six) hours as needed for mild pain.   Yes [provider]  albuterol (PROVENTIL HFA;VENTOLIN HFA) 108 (90 Base) MCG/ACT inhaler Inhale 2 puffs into the lungs every 6 (six) hours as needed for wheezing or shortness of breath.   Yes [provider]  albuterol (PROVENTIL) (2.5 MG/3ML) 0.083% nebulizer solution Take 2.5 mg by  nebulization every 6 (six) hours as needed for wheezing or shortness of breath.   Yes [provider]  budesonide-formoterol (SYMBICORT) 160-4.5 MCG/ACT inhaler Inhale 2 puffs into the lungs 2 (two) times daily.    Yes [provider]  buPROPion (WELLBUTRIN XL) 150 MG 24 hr tablet Take 1 tablet (150 mg total) by mouth daily. 10/12/17  Yes Ursula Alert, MD  clonazePAM (KLONOPIN) 0.5 MG tablet Take 0.5 mg by mouth 3 (three) times daily.    Yes [provider]  Colloidal Oatmeal (EUCERIN ECZEMA RELIEF) 1 % CREA Apply 1 application topically 2 (two) times daily. Apply to right lower leg twice daily.   Yes [provider]  diclofenac sodium (VOLTAREN) 1 % GEL Apply 2 g topically every 8 (eight) hours as needed (arthritis). Apply to right knee   Yes [provider]  dicyclomine (BENTYL)  10 MG capsule Take 10 mg by mouth 3 (three) times daily.    Yes [provider]  ferrous sulfate 325 (65 FE) MG tablet Take 325 mg by mouth 2 (two) times daily.    Yes [provider]  furosemide (LASIX) 80 MG tablet Take 80 mg by mouth.   Yes [provider]  gabapentin (NEURONTIN) 600 MG tablet Take 600 mg by mouth 2 (two) times daily.   Yes [provider]  hydrochlorothiazide (MICROZIDE) 12.5 MG capsule Take 12.5 mg by mouth daily with supper.   Yes [provider]  insulin glargine (LANTUS) 100 UNIT/ML injection Inject 50 Units into the skin every evening.    Yes [provider]  insulin lispro (HUMALOG) 100 UNIT/ML injection Inject 8 Units into the skin 3 (three) times daily before meals.    Yes [provider]  lactulose (CHRONULAC) 10 GM/15ML solution Take 10 g by mouth daily. (0800) 10/17/16  Yes [provider]  lamoTRIgine (LAMICTAL) 100 MG tablet Take 1 tablet (100 mg total) by mouth 2 (two) times daily. (0800 & 2000) 10/12/17  Yes Ursula Alert, MD  lamoTRIgine (LAMICTAL) 25 MG tablet Take 1  tablet (25 mg total) by mouth daily. To be added to the Lamictal 100 mg in the AM 10/12/17  Yes Eappen, Saramma, MD  lidocaine (LIDODERM) 5 % Place 2 patches onto the skin daily. Apply 1 patch to 2 areas on back (2 patches) every morning and remove at bedtime.   Yes [provider]  liraglutide (VICTOZA) 18 MG/3ML SOPN Inject 1.8 mg into the skin daily.   Yes [provider]  LYRICA 150 MG capsule Take 150 mg by mouth 3 (three) times daily. (0800, 1400 & 2000) 11/29/16  Yes [provider]  metFORMIN (GLUCOPHAGE-XR) 500 MG 24 hr tablet Take 500 mg by mouth twice daily. 09/04/17  Yes [provider]  metoCLOPramide (REGLAN) 5 MG tablet Take 5 mg by mouth 4 (four) times daily -  before meals and at bedtime. (0800, 1200, 1600 & 2000)   Yes [provider]  neomycin-bacitracin-polymyxin (NEOSPORIN) [phone] ointment Apply 1 application topically every 12 (twelve) hours as needed (to right forearm).   Yes [provider]  nortriptyline (PAMELOR) 10 MG capsule Take 2 capsules (20 mg total) by mouth at bedtime. For sleep, pain, anxiety 11/23/17  Yes Eappen, Ria Clock, MD  nystatin (MYCOSTATIN/NYSTOP) powder Apply topically 4 (four) times daily. Patient taking differently: Apply topically 4 (four) times daily. Apply to inner thigh/ peri area twice daily and apply to abdomen 4 times daily. 08/07/17  Yes Corcoran, Drue Second, MD  omeprazole (PRILOSEC) 20 MG capsule Take 20 mg by mouth daily.    Yes [provider]  ondansetron (ZOFRAN) 4 MG tablet Take 4 mg by mouth every 4 (four) hours as needed for nausea or vomiting.   Yes [provider]  oxybutynin (DITROPAN-XL) 5 MG 24 hr tablet Take 5 mg by mouth daily. (0800)   Yes [provider]  potassium chloride (K-DUR,KLOR-CON) 10 MEQ tablet Take 1 tablet (10 mEq total) by mouth daily for 4 days. 08/15/17 12/31/17 Yes Paduchowski, Lennette Bihari, MD  rOPINIRole (REQUIP) 3 MG tablet Take 3 mg by mouth at  bedtime. (2000)   Yes [provider]  senna (SENOKOT) 8.6 MG tablet Take 2 tablets by mouth at bedtime.   Yes [provider]  simvastatin (ZOCOR) 10 MG tablet Take 10 mg by mouth at bedtime. (2000)   Yes [provider]  spironolactone (ALDACTONE) 50 MG tablet Take 50 mg by mouth 2 (two) times daily. (0800)   Yes [provider]  vitamin C (ASCORBIC ACID) 500 MG tablet Take 500 mg by mouth 2 (two) times daily. (0800 & 2000)   Yes [provider]    Allergies Doxycycline  Family History  Problem Relation Age of Onset  . Breast cancer Mother   . Lung cancer Mother   . Stomach cancer Father   . Diabetes Unknown        4 aunts, and 1 uncle  . Depression Brother   . Anxiety disorder Brother   . Colon cancer Neg Hx   . Esophageal cancer Neg Hx   . Rectal cancer Neg Hx     Social History Social History   Tobacco Use  . Smoking status: Former Smoker    Packs/day: 1.50    Years: 50.00    Pack years: 75.00    Types: Cigarettes    Last attempt to quit: 04/06/2013    Years since quitting: 4.7  . Smokeless tobacco: Never Used  Substance Use Topics  . Alcohol use: No    Alcohol/week: 0.0 oz  . Drug use: No    Review of Systems  Constitutional: No fever/chills Eyes: No visual changes. ENT: No sore throat. Cardiovascular: Denies chest pain. Respiratory: Denies shortness of breath. Gastrointestinal: No abdominal pain.  No nausea, no vomiting.  No diarrhea.  No constipation. Genitourinary: Negative for dysuria. Musculoskeletal: Negative for back pain. Skin: Negative for rash. Neurological: Negative for headaches, focal weakness or numbness. Psychiatric:Anxiety, bipolar, and depression. Endocrine: Cirrhosis,dabetes, hepatitis, hyperlipidemia.  And hyperlipidemia, Hematological/Lymphatic:Anemia. Allergic/Immunilogical: Doxycycline. ____________________________________________   PHYSICAL EXAM:  VITAL SIGNS: ED Triage Vitals    Enc Vitals Group     BP 12/31/17 1753 (!) 100/57     Pulse Rate 12/31/17 1753 82     Resp 12/31/17 1753 18     Temp 12/31/17 1753 98 F (36.7 C)     Temp Source 12/31/17 1753 Oral     SpO2 12/31/17 1753 97 %     Weight 12/31/17 1754 220 lb (99.8 kg)     Height 12/31/17 1754 5' 3"  (1.6 m)     Head Circumference --      Peak Flow --      Pain Score 12/31/17 1754 9     Pain Loc --      Pain Edu? --      Excl. in [place]? --     Constitutional: Alert and oriented. Well appearing and in no acute distress. Neck: No stridor. No cervical spine tenderness to palpation. Cardiovascular: Normal rate, regular rhythm. Grossly normal heart sounds.  Good peripheral circulation. Respiratory: Normal respiratory effort.  No retractions. Lungs CTAB. Musculoskeletal: No obvious leg length discrepancy.  No obvious deformity.  Tender to palpation midshaft of the femur and anterior patella of the left lower extremity. Neurologic:  Normal speech and language. No gross focal neurologic deficits are appreciated. No gait instability. Skin:  Skin is warm, dry and intact. No rash noted.  No ecchymosisor abrasions. Psychiatric: Mood and affect are normal. Speech and behavior are normal.  ____________________________________________   LABS (all labs ordered are listed, but only abnormal results are displayed)  Labs Reviewed - No data to display ____________________________________________  EKG   ____________________________________________  RADIOLOGY  ED MD interpretation:    Official radiology report(s): Dg Knee Complete 4 Views Left  Result Date: 12/31/2017 CLINICAL DATA:  Fall, left  knee pain EXAM: LEFT KNEE - COMPLETE 4+ VIEW COMPARISON:  11/30/2017 FINDINGS: No acute bony abnormality. Specifically, no fracture, subluxation, or dislocation. No joint effusion. Joint spaces maintained. IMPRESSION: No acute bony abnormality. Electronically Signed   By: Rolm Baptise M.D.   On: 12/31/2017 18:57   Dg Hip  Unilat W Or Wo Pelvis 2-3 Views Left  Result Date: 12/31/2017 CLINICAL DATA:  Fall, left hip and knee pain EXAM: DG HIP (WITH OR WITHOUT PELVIS) 2-3V LEFT COMPARISON:  11/05/2017 FINDINGS: Enthesopathic changes on the greater trochanter. No acute bony abnormality. Specifically, no fracture, subluxation, or dislocation. Hip joints and SI joints are symmetric. IMPRESSION: No acute bony abnormality. Electronically Signed   By: Rolm Baptise M.D.   On: 12/31/2017 18:56    ____________________________________________   PROCEDURES  Procedure(s) performed: None  Procedures  Critical Care performed: No  ____________________________________________   INITIAL IMPRESSION / ASSESSMENT AND PLAN / ED COURSE  As part of my medical decision making, I reviewed the following data within the electronic MEDICAL RECORD NUMBER    Left hip and knee pain secondary to fall.  Discussed negative x-ray findings with patient.  Patient given discharge care instruction.  Patient advised to continue previous medications.  Patient advised follow-up PCP if complaints persist.      ____________________________________________   FINAL CLINICAL IMPRESSION(S) / ED DIAGNOSES  Final diagnoses:  Fall in home, initial encounter  Musculoskeletal pain of lower extremity, left     ED Discharge Orders    None       Note:  This document was prepared using Dragon voice recognition software and may include unintentional dictation errors.    Sable Feil, PA-C 12/31/17 1921    Hinda Kehr, MD 12/31/17 2011

## 2017-12-31 NOTE — ED Notes (Signed)
Copy of chart along with xray results provided by PA given to pt to take back to [facility]; pt placed in lobby by officer on duty to wait for her friend coming to pick her up; officer to help pt to car when friend arrives;

## 2017-12-31 NOTE — Discharge Instructions (Signed)
Follow discharge care instructions and continue previous medications.

## 2018-01-02 ENCOUNTER — Ambulatory Visit (INDEPENDENT_AMBULATORY_CARE_PROVIDER_SITE_OTHER): Payer: Medicare Other | Admitting: Gastroenterology

## 2018-01-02 ENCOUNTER — Encounter: Payer: Self-pay | Admitting: Gastroenterology

## 2018-01-02 VITALS — BP 87/58 | HR 82 | Ht 63.0 in | Wt 222.0 lb

## 2018-01-02 DIAGNOSIS — K7460 Unspecified cirrhosis of liver: Secondary | ICD-10-CM

## 2018-01-02 NOTE — Progress Notes (Signed)
Primary Care Physician: Sarajane Jews, MD  Primary Gastroenterologist:  Dr. Lucilla Lame  Chief Complaint  Patient presents with  . Cirrhosis    HPI: Stephanie [name] is a 67 y.o. female here with a history of cirrhosis of the liver without ascites comes in today for follow-up. The patient was last seen by me in October 2018.  She reports that she did not go to be seen at [facility] for liver transplant evaluation.  She also reports that she has had abdominal bloating recently. Her last CBC back in June showed her platelets to be low at 119. The patient was also previously found to have elevated alkaline phosphatase with normal liver enzymes. The patient had a liver biopsy in 2013 that showed steatosis but no cirrhosis. The patient had followed up in [place] with Dr. Deatra Ina and has had upper endoscopies and colonoscopies in the past.  Her last procedure appears to have been in 2016.  She denies any change in bowel habits black stools or bloody stools.  Current Outpatient Medications  Medication Sig Dispense Refill  . acetaminophen (TYLENOL) 325 MG tablet Take 650 mg by mouth every 6 (six) hours as needed for mild pain.    Marland Kitchen albuterol (PROVENTIL HFA;VENTOLIN HFA) 108 (90 Base) MCG/ACT inhaler Inhale 2 puffs into the lungs every 6 (six) hours as needed for wheezing or shortness of breath.    Marland Kitchen albuterol (PROVENTIL) (2.5 MG/3ML) 0.083% nebulizer solution Take 2.5 mg by nebulization every 6 (six) hours as needed for wheezing or shortness of breath.    . budesonide-formoterol (SYMBICORT) 160-4.5 MCG/ACT inhaler Inhale 2 puffs into the lungs 2 (two) times daily.     Marland Kitchen buPROPion (WELLBUTRIN XL) 150 MG 24 hr tablet Take 1 tablet (150 mg total) by mouth daily. 90 tablet 1  . clonazePAM (KLONOPIN) 0.5 MG tablet Take 0.5 mg by mouth 3 (three) times daily.     . Colloidal Oatmeal (EUCERIN ECZEMA RELIEF) 1 % CREA Apply 1 application topically 2 (two) times daily. Apply to right lower leg  twice daily.    . diclofenac sodium (VOLTAREN) 1 % GEL Apply 2 g topically every 8 (eight) hours as needed (arthritis). Apply to right knee    . dicyclomine (BENTYL) 10 MG capsule Take 10 mg by mouth 3 (three) times daily.     . ferrous sulfate 325 (65 FE) MG tablet Take 325 mg by mouth 2 (two) times daily.     . furosemide (LASIX) 80 MG tablet Take 80 mg by mouth.    . gabapentin (NEURONTIN) 600 MG tablet Take 600 mg by mouth 2 (two) times daily.    . hydrochlorothiazide (MICROZIDE) 12.5 MG capsule Take 12.5 mg by mouth daily with supper.    . insulin glargine (LANTUS) 100 UNIT/ML injection Inject 50 Units into the skin every evening.     . insulin lispro (HUMALOG) 100 UNIT/ML injection Inject 8 Units into the skin 3 (three) times daily before meals.     . lactulose (CHRONULAC) 10 GM/15ML solution Take 10 g by mouth daily. (0800)    . lamoTRIgine (LAMICTAL) 100 MG tablet Take 1 tablet (100 mg total) by mouth 2 (two) times daily. (0800 & 2000) 180 tablet 1  . lamoTRIgine (LAMICTAL) 25 MG tablet Take 1 tablet (25 mg total) by mouth daily. To be added to the Lamictal 100 mg in the AM 90 tablet 1  . lidocaine (LIDODERM) 5 % Place 2 patches onto the skin daily. Apply  1 patch to 2 areas on back (2 patches) every morning and remove at bedtime.    . liraglutide (VICTOZA) 18 MG/3ML SOPN Inject 1.8 mg into the skin daily.    Marland Kitchen LYRICA 150 MG capsule Take 150 mg by mouth 3 (three) times daily. (0800, 1400 & 2000)    . metFORMIN (GLUCOPHAGE-XR) 500 MG 24 hr tablet Take 500 mg by mouth twice daily.    . metoCLOPramide (REGLAN) 5 MG tablet Take 5 mg by mouth 4 (four) times daily -  before meals and at bedtime. (0800, 1200, 1600 & 2000)    . neomycin-bacitracin-polymyxin (NEOSPORIN) [phone] ointment Apply 1 application topically every 12 (twelve) hours as needed (to right forearm).    . nortriptyline (PAMELOR) 10 MG capsule Take 2 capsules (20 mg total) by mouth at bedtime. For sleep, pain, anxiety 180 capsule  1  . nystatin (MYCOSTATIN/NYSTOP) powder Apply topically 4 (four) times daily. (Patient taking differently: Apply topically 4 (four) times daily. Apply to inner thigh/ peri area twice daily and apply to abdomen 4 times daily.) 15 g 0  . omeprazole (PRILOSEC) 20 MG capsule Take 20 mg by mouth daily.     . ondansetron (ZOFRAN) 4 MG tablet Take 4 mg by mouth every 4 (four) hours as needed for nausea or vomiting.    Marland Kitchen oxybutynin (DITROPAN-XL) 5 MG 24 hr tablet Take 5 mg by mouth daily. (0800)    . rOPINIRole (REQUIP) 3 MG tablet Take 3 mg by mouth at bedtime. (2000)    . senna (SENOKOT) 8.6 MG tablet Take 2 tablets by mouth at bedtime.    . simvastatin (ZOCOR) 10 MG tablet Take 10 mg by mouth at bedtime. (2000)    . spironolactone (ALDACTONE) 50 MG tablet Take 50 mg by mouth 2 (two) times daily. (0800)    . vitamin C (ASCORBIC ACID) 500 MG tablet Take 500 mg by mouth 2 (two) times daily. (0800 & 2000)    . potassium chloride (K-DUR,KLOR-CON) 10 MEQ tablet Take 1 tablet (10 mEq total) by mouth daily for 4 days. 4 tablet 0   No current facility-administered medications for this visit.     Allergies as of 01/02/2018 - Review Complete 01/02/2018  Allergen Reaction Noted  . Doxycycline Hives, Swelling, Other (See Comments), and Nausea Only 05/05/2014    ROS:  General: Negative for anorexia, weight loss, fever, chills, fatigue, weakness. ENT: Negative for hoarseness, difficulty swallowing , nasal congestion. CV: Negative for chest pain, angina, palpitations, dyspnea on exertion, peripheral edema.  Respiratory: Negative for dyspnea at rest, dyspnea on exertion, cough, sputum, wheezing.  GI: See history of present illness. GU:  Negative for dysuria, hematuria, urinary incontinence, urinary frequency, nocturnal urination.  Endo: Negative for unusual weight change.    Physical Examination:   BP (!) 87/58   Pulse 82   Ht 5' 3"  (1.6 m)   Wt 222 lb (100.7 kg)   BMI 39.33 kg/m   General:  Well-nourished, well-developed in no acute distress.  Eyes: No icterus. Conjunctivae pink. Mouth: Oropharyngeal mucosa moist and pink , no lesions erythema or exudate. Lungs: Clear to auscultation bilaterally. Non-labored. Heart: Regular rate and rhythm, no murmurs rubs or gallops.  Abdomen: Bowel sounds are normal, nontender, nondistended, no hepatosplenomegaly or masses, no abdominal bruits or hernia , no rebound or guarding.   Extremities: No lower extremity edema. No clubbing or deformities. Neuro: Alert and oriented x 3.  Grossly intact. Skin: Warm and dry, no jaundice.   Psych: Alert  and cooperative, normal mood and affect.  Labs:    Imaging Studies: Dg Knee Complete 4 Views Left  Result Date: 12/31/2017 CLINICAL DATA:  Fall, left knee pain EXAM: LEFT KNEE - COMPLETE 4+ VIEW COMPARISON:  11/30/2017 FINDINGS: No acute bony abnormality. Specifically, no fracture, subluxation, or dislocation. No joint effusion. Joint spaces maintained. IMPRESSION: No acute bony abnormality. Electronically Signed   By: Rolm Baptise M.D.   On: 12/31/2017 18:57   Dg Hip Unilat W Or Wo Pelvis 2-3 Views Left  Result Date: 12/31/2017 CLINICAL DATA:  Fall, left hip and knee pain EXAM: DG HIP (WITH OR WITHOUT PELVIS) 2-3V LEFT COMPARISON:  11/05/2017 FINDINGS: Enthesopathic changes on the greater trochanter. No acute bony abnormality. Specifically, no fracture, subluxation, or dislocation. Hip joints and SI joints are symmetric. IMPRESSION: No acute bony abnormality. Electronically Signed   By: Rolm Baptise M.D.   On: 12/31/2017 18:56    Assessment and Plan:   Stephanie [name] is a 74 y.o. y/o female Who has a history of cirrhosis. The patient has been doing well except that she states that her abdomen has been more distended and bloated.  The patient will have her labs sent off for alpha-fetoprotein and she will have a abdominal ultrasound to look for any liver masses and ascites.  The patient has been explained  the plan and agrees with it.    Lucilla Lame, MD. Marval Regal   Note: This dictation was prepared with Dragon dictation along with smaller phrase technology. Any transcriptional errors that result from this process are unintentional.

## 2018-01-02 NOTE — Patient Instructions (Addendum)
You are scheduled for a RUQ abdominal US at [facility] location on Monday, August 5th @ 11:00 am. Please arrive at the registration desk at 10:45am. You cannot have anything to eat or drink after midnight on Sunday night.  If you need to reschedule this appointment for any reason, please contact central scheduling at [phone].

## 2018-01-04 ENCOUNTER — Ambulatory Visit: Payer: Medicare Other | Attending: Nurse Practitioner | Admitting: Nurse Practitioner

## 2018-01-04 ENCOUNTER — Encounter: Payer: Self-pay | Admitting: Nurse Practitioner

## 2018-01-04 ENCOUNTER — Other Ambulatory Visit: Payer: Self-pay

## 2018-01-04 VITALS — BP 119/45 | HR 78 | Temp 97.8°F | Resp 16 | Ht 63.0 in | Wt 221.0 lb

## 2018-01-04 DIAGNOSIS — E119 Type 2 diabetes mellitus without complications: Secondary | ICD-10-CM | POA: Insufficient documentation

## 2018-01-04 DIAGNOSIS — F319 Bipolar disorder, unspecified: Secondary | ICD-10-CM | POA: Insufficient documentation

## 2018-01-04 DIAGNOSIS — E785 Hyperlipidemia, unspecified: Secondary | ICD-10-CM | POA: Insufficient documentation

## 2018-01-04 DIAGNOSIS — F419 Anxiety disorder, unspecified: Secondary | ICD-10-CM | POA: Diagnosis not present

## 2018-01-04 DIAGNOSIS — M545 Low back pain: Secondary | ICD-10-CM | POA: Insufficient documentation

## 2018-01-04 DIAGNOSIS — G4700 Insomnia, unspecified: Secondary | ICD-10-CM | POA: Diagnosis not present

## 2018-01-04 DIAGNOSIS — K760 Fatty (change of) liver, not elsewhere classified: Secondary | ICD-10-CM | POA: Insufficient documentation

## 2018-01-04 DIAGNOSIS — Z5181 Encounter for therapeutic drug level monitoring: Secondary | ICD-10-CM | POA: Diagnosis not present

## 2018-01-04 DIAGNOSIS — G2581 Restless legs syndrome: Secondary | ICD-10-CM | POA: Insufficient documentation

## 2018-01-04 DIAGNOSIS — I6523 Occlusion and stenosis of bilateral carotid arteries: Secondary | ICD-10-CM | POA: Insufficient documentation

## 2018-01-04 DIAGNOSIS — D509 Iron deficiency anemia, unspecified: Secondary | ICD-10-CM | POA: Insufficient documentation

## 2018-01-04 DIAGNOSIS — Z9049 Acquired absence of other specified parts of digestive tract: Secondary | ICD-10-CM | POA: Diagnosis not present

## 2018-01-04 DIAGNOSIS — K7460 Unspecified cirrhosis of liver: Secondary | ICD-10-CM | POA: Insufficient documentation

## 2018-01-04 DIAGNOSIS — K219 Gastro-esophageal reflux disease without esophagitis: Secondary | ICD-10-CM | POA: Diagnosis not present

## 2018-01-04 DIAGNOSIS — Z794 Long term (current) use of insulin: Secondary | ICD-10-CM | POA: Insufficient documentation

## 2018-01-04 DIAGNOSIS — Z87891 Personal history of nicotine dependence: Secondary | ICD-10-CM | POA: Insufficient documentation

## 2018-01-04 DIAGNOSIS — G8929 Other chronic pain: Secondary | ICD-10-CM

## 2018-01-04 DIAGNOSIS — M1711 Unilateral primary osteoarthritis, right knee: Secondary | ICD-10-CM | POA: Insufficient documentation

## 2018-01-04 DIAGNOSIS — Z881 Allergy status to other antibiotic agents status: Secondary | ICD-10-CM | POA: Insufficient documentation

## 2018-01-04 DIAGNOSIS — Z8673 Personal history of transient ischemic attack (TIA), and cerebral infarction without residual deficits: Secondary | ICD-10-CM | POA: Insufficient documentation

## 2018-01-04 DIAGNOSIS — M79604 Pain in right leg: Secondary | ICD-10-CM | POA: Insufficient documentation

## 2018-01-04 DIAGNOSIS — G4733 Obstructive sleep apnea (adult) (pediatric): Secondary | ICD-10-CM | POA: Diagnosis not present

## 2018-01-04 DIAGNOSIS — M4802 Spinal stenosis, cervical region: Secondary | ICD-10-CM | POA: Diagnosis not present

## 2018-01-04 DIAGNOSIS — I10 Essential (primary) hypertension: Secondary | ICD-10-CM | POA: Diagnosis not present

## 2018-01-04 DIAGNOSIS — J45909 Unspecified asthma, uncomplicated: Secondary | ICD-10-CM | POA: Insufficient documentation

## 2018-01-04 DIAGNOSIS — G894 Chronic pain syndrome: Secondary | ICD-10-CM | POA: Insufficient documentation

## 2018-01-04 DIAGNOSIS — Z833 Family history of diabetes mellitus: Secondary | ICD-10-CM | POA: Insufficient documentation

## 2018-01-04 DIAGNOSIS — Z66 Do not resuscitate: Secondary | ICD-10-CM | POA: Diagnosis not present

## 2018-01-04 DIAGNOSIS — Z6841 Body Mass Index (BMI) 40.0 and over, adult: Secondary | ICD-10-CM | POA: Insufficient documentation

## 2018-01-04 DIAGNOSIS — E559 Vitamin D deficiency, unspecified: Secondary | ICD-10-CM | POA: Insufficient documentation

## 2018-01-04 DIAGNOSIS — Z818 Family history of other mental and behavioral disorders: Secondary | ICD-10-CM | POA: Insufficient documentation

## 2018-01-04 DIAGNOSIS — Z7951 Long term (current) use of inhaled steroids: Secondary | ICD-10-CM | POA: Insufficient documentation

## 2018-01-04 DIAGNOSIS — Z79899 Other long term (current) drug therapy: Secondary | ICD-10-CM | POA: Insufficient documentation

## 2018-01-04 IMAGING — US US EXTREM LOW VENOUS BILAT
1 series · 13 of 24 positions shown · non-contrast
Comparison: None.

CLINICAL DATA: Bilateral lower extremity pain, right greater than
left for 4 months. Edema, color changes, varicose veins, and spider
veins.



[Series 1: us extrem low venous bilat · 0.08mm/px · 13 of 65 slices shown]
[im 1/65]
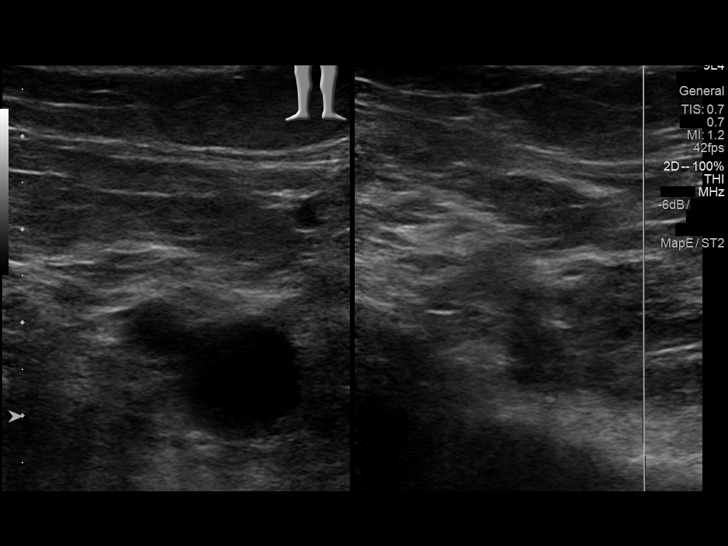
[im 6/65]
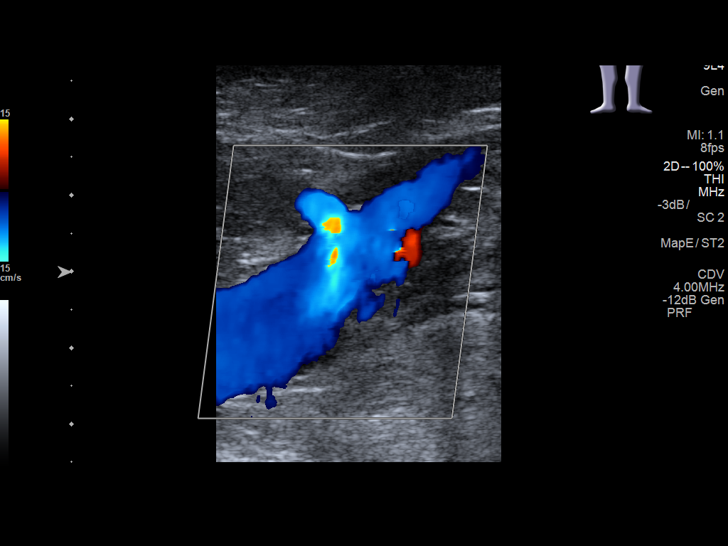
[im 12/65]
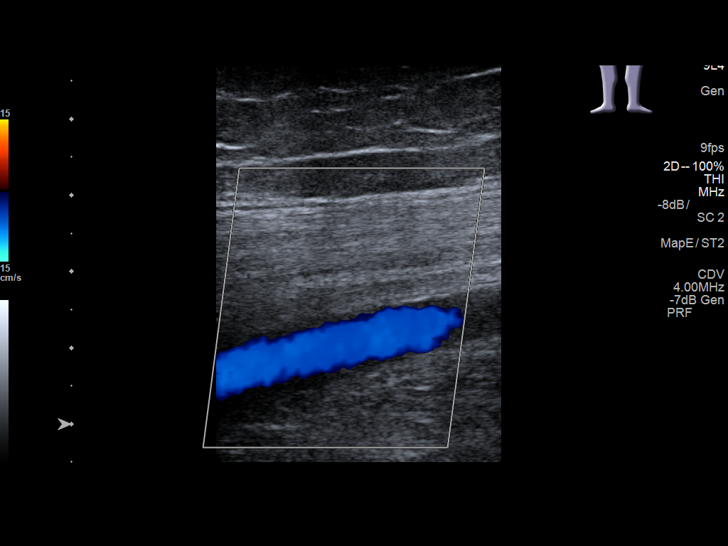
[im 17/65]
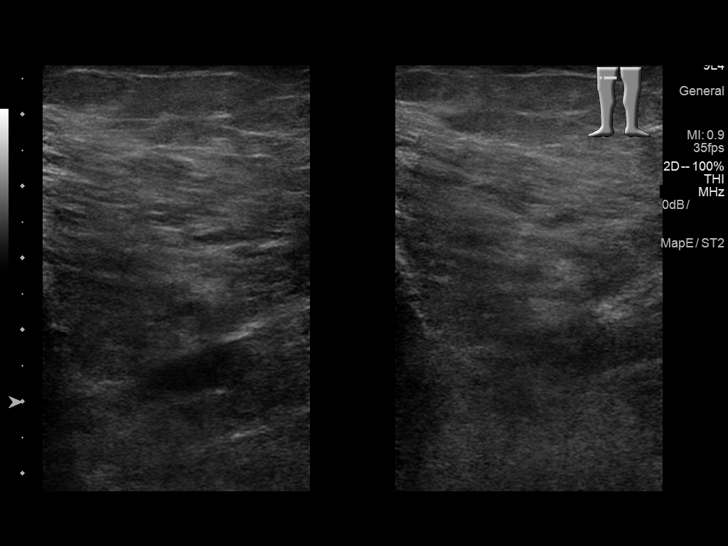
[im 23/65]
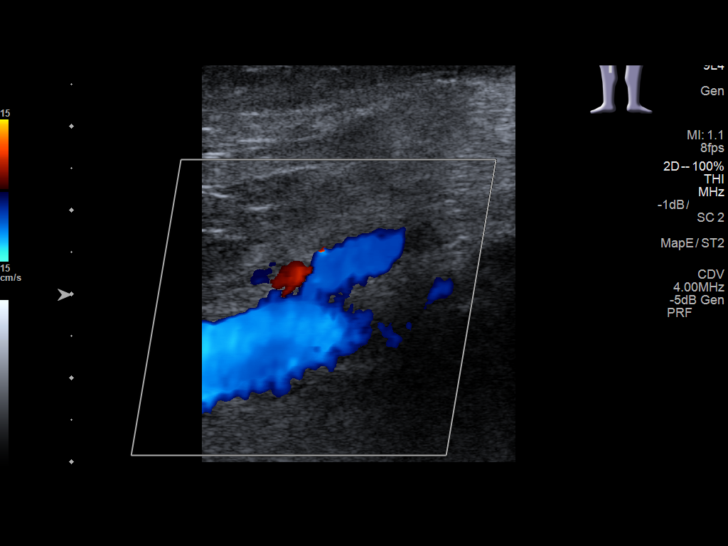
[im 28/65]
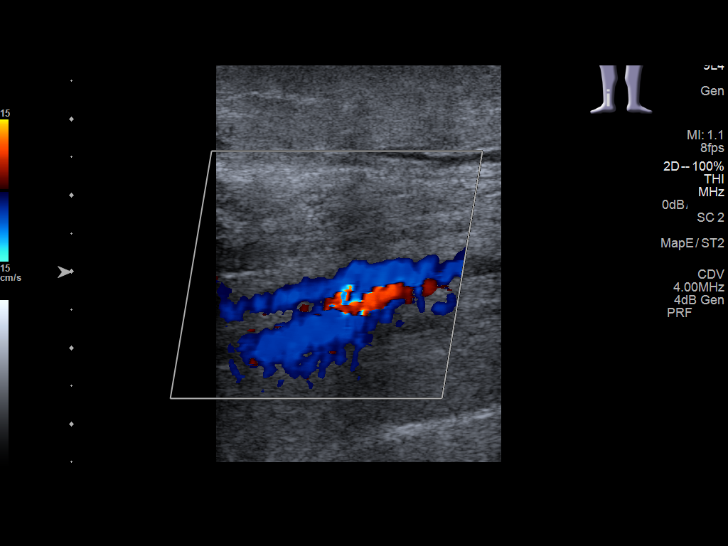
[im 34/65]
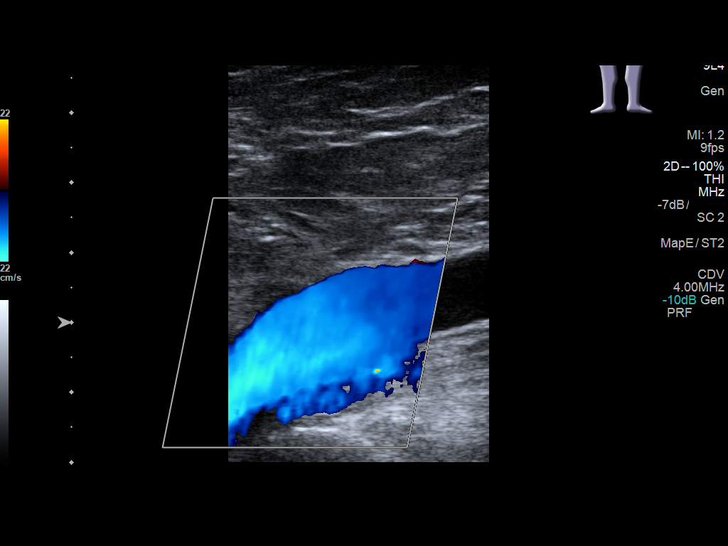
[im 37/65]
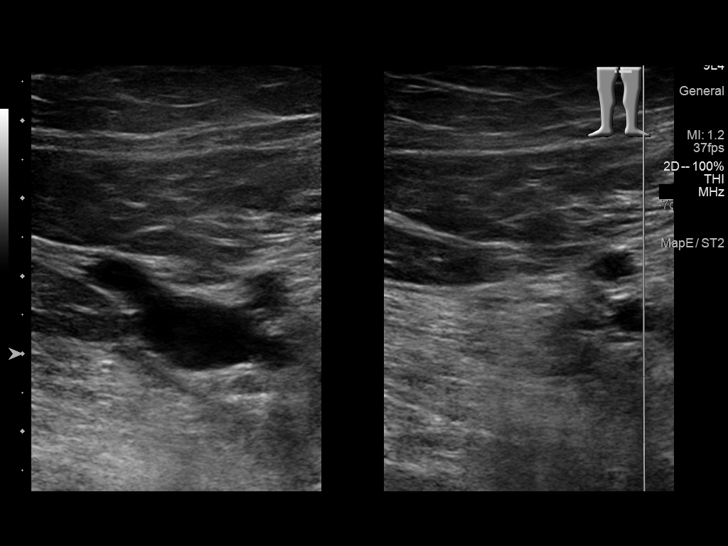
[im 42/65]
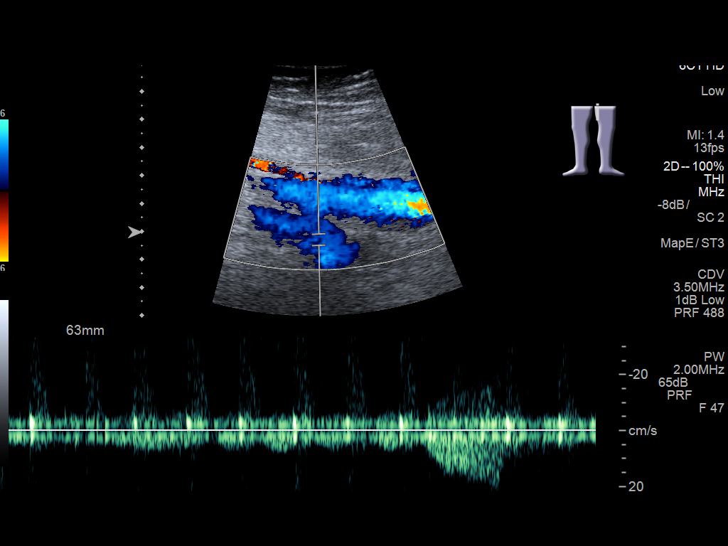
[im 48/65]
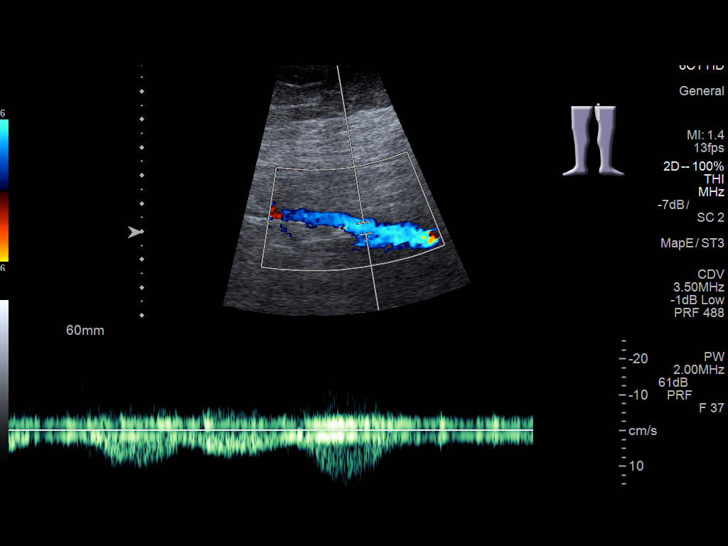
[im 53/65]
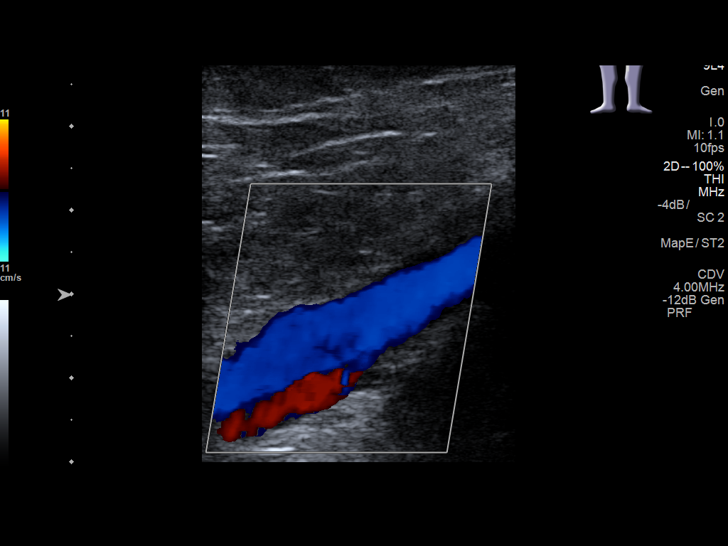
[im 59/65]
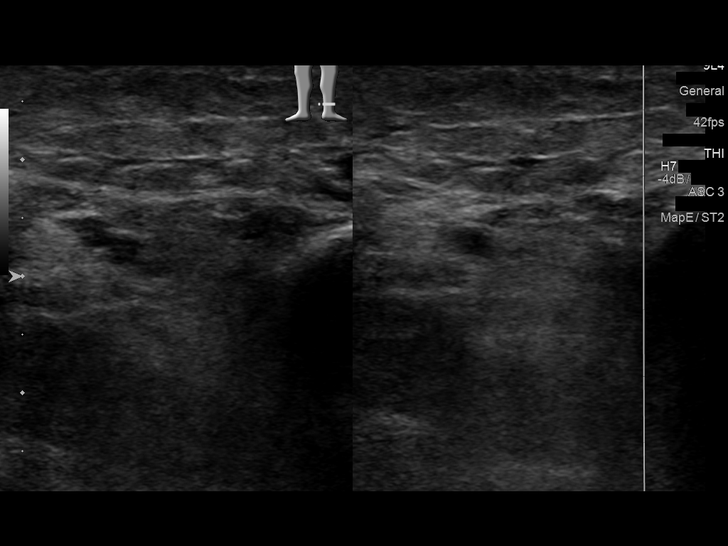
[im 65/65]
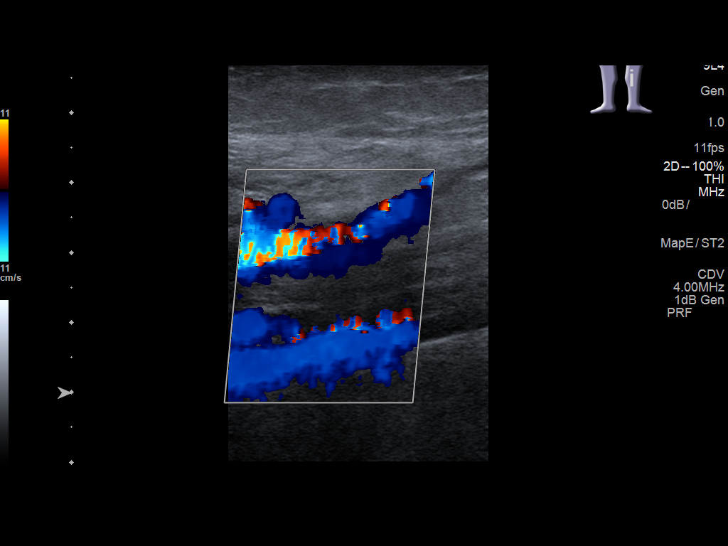

[13 of 24 positions shown; findings below may reference images not displayed]

FINDINGS: RIGHT LOWER EXTREMITY

Common Femoral Vein: No evidence of thrombus. Normal
compressibility, respiratory phasicity and response to augmentation.

Saphenofemoral Junction: No evidence of thrombus. Normal
compressibility and flow on color Doppler imaging.

Profunda Femoral Vein: No evidence of thrombus. Normal
compressibility and flow on color Doppler imaging.

Femoral Vein: No evidence of thrombus. Normal compressibility,
respiratory phasicity and response to augmentation.

Popliteal Vein: No evidence of thrombus. Normal compressibility,
respiratory phasicity and response to augmentation.

Calf Veins: No evidence of thrombus. Normal compressibility and flow
on color Doppler imaging.

Superficial Great Saphenous Vein: No evidence of thrombus. Normal
compressibility and flow on color Doppler imaging.

Venous Reflux:  None.

Other Findings:  None.

LEFT LOWER EXTREMITY

Common Femoral Vein: No evidence of thrombus. Normal
compressibility, respiratory phasicity and response to augmentation.

Saphenofemoral Junction: No evidence of thrombus. Normal
compressibility and flow on color Doppler imaging.

Profunda Femoral Vein: No evidence of thrombus. Normal
compressibility and flow on color Doppler imaging.

Femoral Vein: No evidence of thrombus. Normal compressibility,
respiratory phasicity and response to augmentation.

Popliteal Vein: No evidence of thrombus. Normal compressibility,
respiratory phasicity and response to augmentation.

Calf Veins: No evidence of thrombus. Normal compressibility and flow
on color Doppler imaging.

Superficial Great Saphenous Vein: No evidence of thrombus. Normal
compressibility and flow on color Doppler imaging.

Venous Reflux:  None.

Other Findings:  None.
IMPRESSION: No evidence of deep venous thrombosis.

## 2018-01-04 MED ORDER — HYDROCODONE-ACETAMINOPHEN 7.5-325 MG PO TABS: 1.0000 | ORAL_TABLET | Freq: Four times a day (QID) | ORAL | 0 refills | Status: DC | PRN

## 2018-01-04 MED ORDER — GABAPENTIN 600 MG PO TABS: 600.0000 mg | ORAL_TABLET | Freq: Three times a day (TID) | ORAL | 2 refills | Status: DC

## 2018-01-04 NOTE — Progress Notes (Signed)
Safety precautions to be maintained throughout the outpatient stay will include: orient to surroundings, keep bed in low position, maintain call bell within reach at all times, provide assistance with transfer out of bed and ambulation.  

## 2018-01-04 NOTE — Patient Instructions (Addendum)
You have been given 3 Rx for Hydrocodone with acetaminophen to last until 04/16/2018. Gabapentin has been escribed to your pharmacy. ___________________________________________________________________________________________  Medication Rules  Applies to: All patients receiving prescriptions (written or electronic).  Pharmacy of record: Pharmacy where electronic prescriptions will be sent. If written prescriptions are taken to a different pharmacy, please inform the nursing staff. The pharmacy listed in the electronic medical record should be the one where you would like electronic prescriptions to be sent.  Prescription refills: Only during scheduled appointments. Applies to both, written and electronic prescriptions.  NOTE: The following applies primarily to controlled substances (Opioid* Pain Medications).   Patient's responsibilities: 1. Pain Pills: Bring all pain pills to every appointment (except for procedure appointments). 2. Pill Bottles: Bring pills in original pharmacy bottle. Always bring newest bottle. Bring bottle, even if empty. 3. Medication refills: You are responsible for knowing and keeping track of what medications you need refilled. The day before your appointment, write a list of all prescriptions that need to be refilled. Bring that list to your appointment and give it to the admitting nurse. Prescriptions will be written only during appointments. If you forget a medication, it will not be "Called in", "Faxed", or "electronically sent". You will need to get another appointment to get these prescribed. 4. Prescription Accuracy: You are responsible for carefully inspecting your prescriptions before leaving our office. Have the discharge nurse carefully go over each prescription with you, before taking them home. Make sure that your name is accurately spelled, that your address is correct. Check the name and dose of your medication to make sure it is accurate. Check the number of  pills, and the written instructions to make sure they are clear and accurate. Make sure that you are given enough medication to last until your next medication refill appointment. 5. Taking Medication: Take medication as prescribed. Never take more pills than instructed. Never take medication more frequently than prescribed. Taking less pills or less frequently is permitted and encouraged, when it comes to controlled substances (written prescriptions).  6. Inform other Doctors: Always inform, all of your healthcare providers, of all the medications you take. 7. Pain Medication from other Providers: You are not allowed to accept any additional pain medication from any other Doctor or Healthcare provider. There are two exceptions to this rule. (see below) In the event that you require additional pain medication, you are responsible for notifying us, as stated below. 8. Medication Agreement: You are responsible for carefully reading and following our Medication Agreement. This must be signed before receiving any prescriptions from our practice. Safely store a copy of your signed Agreement. Violations to the Agreement will result in no further prescriptions. (Additional copies of our Medication Agreement are available upon request.) 9. Laws, Rules, & Regulations: All patients are expected to follow all Federal and [employer], Rules, [employer]. Ignorance of the Laws does not constitute a valid excuse. The use of any illegal substances is prohibited. 10. Adopted CDC guidelines & recommendations: Target dosing levels will be at or below 60 MME/day. Use of benzodiazepines** is not recommended.  Exceptions: There are only two exceptions to the rule of not receiving pain medications from other Healthcare Providers. 1. Exception #1 (Emergencies): In the event of an emergency (i.e.: accident requiring emergency care), you are allowed to receive additional pain medication. However, you are responsible for:  As soon as you are able, call our office [phone], at any time of the day or night, and  leave a message stating your name, the date and nature of the emergency, and the name and dose of the medication prescribed. In the event that your call is answered by a member of our staff, make sure to document and save the date, time, and the name of the person that took your information.  2. Exception #2 (Planned Surgery): In the event that you are scheduled by another doctor or dentist to have any type of surgery or procedure, you are allowed (for a period no longer than 30 days), to receive additional pain medication, for the acute post-op pain. However, in this case, you are responsible for picking up a copy of our "Post-op Pain Management for Surgeons" handout, and giving it to your surgeon or dentist. This document is available at our office, and does not require an appointment to obtain it. Simply go to our office during business hours (Monday-Thursday from 8:00 AM to 4:00 PM) (Friday 8:00 AM to 12:00 Noon) or if you have a scheduled appointment with [name], prior to your surgery, and ask for it by name. In addition, you will need to provide [name] with your name, name of your surgeon, type of surgery, and date of procedure or surgery.  *Opioid medications include: morphine, codeine, oxycodone, oxymorphone, hydrocodone, hydromorphone, meperidine, tramadol, tapentadol, buprenorphine, fentanyl, methadone. **Benzodiazepine medications include: diazepam (Valium), alprazolam (Xanax), clonazepam (Klonopine), lorazepam (Ativan), clorazepate (Tranxene), chlordiazepoxide (Librium), estazolam (Prosom), oxazepam (Serax), temazepam (Restoril), triazolam (Halcion) (Last updated: 08/03/2017) ____________________________________________________________________________________________

## 2018-01-04 NOTE — Progress Notes (Signed)
Results were reviewed and found to be: abnormal  Surgical consultation is recommended

## 2018-01-04 NOTE — Progress Notes (Signed)
Patient's Name: Stephanie [name]  MRN: [id number]  Referring Provider: Sarajane Jews, MD  DOB: 12/24/1950  PCP: Sarajane Jews, MD  DOS: 01/04/2018  Note by: Vevelyn Francois NP  Service setting: Ambulatory outpatient  Specialty: Interventional Pain Management  Location: [facility] (AMB) Pain Management Facility    Patient type: Established    Primary Reason(s) for Visit: Encounter for prescription drug management & post-procedure evaluation of chronic illness with mild to moderate exacerbation(Level of risk: moderate) CC: Leg Pain (right)  HPI  Stephanie [name] is a 67 y.o. year old, female patient, who comes today for a post-procedure evaluation and medication management. She has UNSPECIFIED VITAMIN D DEFICIENCY; HLD (hyperlipidemia); TOBACCO ABUSE; OSA (obstructive sleep apnea); RESTLESS LEGS SYNDROME; Essential hypertension; Asthma; BACK PAIN, LUMBAR, CHRONIC; INSOMNIA; UNS ADVRS EFF OTH RX MEDICINAL&BIOLOGICAL SBSTNC; BIPOLAR AFFECTIVE DISORDER, HX OF; Aortic dissection ([place]); Anxiety; Lower extremity edema; Cirrhosis of liver ([place]); Varicose veins of left lower extremity; GERD (gastroesophageal reflux disease); Dyspnea; Lung nodule; DNR (do not resuscitate); Encounter for routine gynecological examination; Poorly controlled type 2 diabetes mellitus with complication ([place]); Dizziness and giddiness; DOE (dyspnea on exertion); Liver cirrhosis secondary to NASH ([place]); Abdominal pain, chronic, epigastric; Thrombocytopenia ([place]); Neuropathy; Hyperglycemia; Hypoglycemia; Iron deficiency anemia; Chest pain; Pneumonia; Pain in limb; Swelling of limb; Postphlebitic syndrome with inflammation; Lymphedema; Encephalopathy; Bilateral carotid artery stenosis; Degenerative tear of lateral meniscus of right knee; Degenerative tear of medial meniscus of right knee; Diabetes mellitus type 1, uncontrolled, without complications ([place]); Primary osteoarthritis of right knee; Altered mental status; Elevated alkaline phosphatase level; Sepsis  ([place]); Cervical stenosis of spine; Obesity, Class III, BMI 40-49.9 (morbid obesity) ([place]); Stress fracture of femur; and Tricompartment osteoarthritis of right knee on their problem list. Her primarily concern today is the Leg Pain (right)  Pain Assessment: Location: Right Leg Radiating: mainly at R knee area Onset: More than a month ago Duration: Chronic pain Quality: Sharp Severity: 8 /10 (subjective, self-reported pain score)  Note: Reported level is compatible with observation. Clinically the patient looks like a 3/10 A 3/10 is viewed as "Moderate" and described as significantly interfering with activities of daily living (ADL). It becomes difficult to feed, bathe, get dressed, get on and off the toilet or to perform personal hygiene functions. Difficult to get in and out of bed or a chair without assistance. Very distracting. With effort, it can be ignored when deeply involved in activities. Discrepancy may suggest a "suffering" (psychosocial) component. When using our objective Pain Scale, levels between 6 and 10/10 are said to belong in an emergency room, as it progressively worsens from a 6/10, described as severely limiting, requiring emergency care not usually available at an outpatient pain management facility. At a 6/10 level, communication becomes difficult and requires great effort. Assistance to reach the emergency department may be required. Facial flushing and profuse sweating along with potentially dangerous increases in heart rate and blood pressure will be evident. Effect on ADL: unable to sleep throught the night Timing: Intermittent Modifying factors: nothing helps BP: (!) 119/45  HR: 78  Stephanie [name] was last seen on 11/16/2017 for a procedure. During today's appointment we reviewed Stephanie [name]'s post-procedure results, as well as her outpatient medication regimen.  She admits that she has had multiple falls.  She has been evaluated with images.  She is now having bilateral knee  pain.  She feels the falls are related to overcompensation.  She was seen by Ortho however she is waiting for another Ortho specialist appointment appointment for  possible surgery.  She admits that she is going to start physical therapy.  Further details on both, my assessment(s), as well as the proposed treatment plan, please see below.  Controlled Substance Pharmacotherapy Assessment REMS (Risk Evaluation and Mitigation Strategy)  Analgesic: Hydrocodone/acetaminophen 7.5/325 1 tablet 4 times daily MME/day: 30 mg/day.  Rise Patience, RN  01/04/2018 11:01 AM  Signed Safety precautions to be maintained throughout the outpatient stay will include: orient to surroundings, keep bed in low position, maintain call bell within reach at all times, provide assistance with transfer out of bed and ambulation.    Pharmacokinetics: Liberation and absorption (onset of action): WNL Distribution (time to peak effect): Shorter than expected. Metabolism and excretion (duration of action): Shorter than expected.         Pharmacodynamics: Desired effects: Analgesia: Stephanie [name] reports <50% benefit. Functional ability: Patient reports that medication allows her to accomplish basic ADLs Clinically meaningful improvement in function (CMIF): Medication does not meet basic CMIF Perceived effectiveness: Described as relatively effective but with some room for improvement Undesirable effects: Side-effects or Adverse reactions: None reported Monitoring: [place] PMP: Unable to view secondary to patient being in a facility.  Last UDS on record: Summary  Date Value Ref Range Status  02/02/2017 FINAL  Final    Comment:    ==================================================================== TOXASSURE COMP DRUG ANALYSIS,UR ==================================================================== Test                             Result       Flag       Units Drug Present and Declared for Prescription Verification    7-aminoclonazepam              330          EXPECTED   ng/mg creat    7-aminoclonazepam is an expected metabolite of clonazepam. Source    of clonazepam is a scheduled prescription medication.   Tramadol                       11726        EXPECTED   ng/mg creat   O-Desmethyltramadol            3096         EXPECTED   ng/mg creat   N-Desmethyltramadol            7048         EXPECTED   ng/mg creat    Source of tramadol is a prescription medication.    O-desmethyltramadol and N-desmethyltramadol are expected    metabolites of tramadol.   Gabapentin                     PRESENT      EXPECTED   Pregabalin                     PRESENT      EXPECTED   Lamotrigine                    PRESENT      EXPECTED   Bupropion                      PRESENT      EXPECTED   Hydroxybupropion               PRESENT      EXPECTED  Hydroxybupropion is an expected metabolite of bupropion.   Mirtazapine                    PRESENT      EXPECTED   Acetaminophen                  PRESENT      EXPECTED   Lidocaine                      PRESENT      EXPECTED Drug Present not Declared for Prescription Verification   Alcohol, Ethyl                 0.124        UNEXPECTED g/dL    Sources of ethyl alcohol include alcoholic beverages or as a    fermentation product of glucose; glucose was not detected in this    specimen. Ethyl alcohol result should be interpreted in the    context of all available clinical and behavioral information.   Ibuprofen                      PRESENT      UNEXPECTED Drug Absent but Declared for Prescription Verification   Oxycodone                      Not Detected UNEXPECTED ng/mg creat   Tizanidine                     Not Detected UNEXPECTED    Tizanidine, as indicated in the declared medication list, is not    always detected even when used as directed.   Zolpidem                       Not Detected UNEXPECTED    Zolpidem, as indicated in the declared medication list, is not    always detected  even when used as directed.   Diclofenac                     Not Detected UNEXPECTED    Diclofenac, as indicated in the declared medication list, is not    always detected even when used as directed.   Diphenhydramine                Not Detected UNEXPECTED   Guaifenesin                    Not Detected UNEXPECTED ==================================================================== Test                      Result    Flag   Units      Ref Range   Creatinine              27               mg/dL      >=20 ==================================================================== Declared Medications:  The flagging and interpretation on this report are based on the  following declared medications.  Unexpected results may arise from  inaccuracies in the declared medications.  **Note: The testing scope of this panel includes these medications:  Bupropion (Wellbutrin)  Clonazepam (Klonopin)  Diphenhydramine (Benadryl)  Gabapentin (Neurontin)  Guaifenesin  Lamotrigine (Lamictal)  Mirtazapine (Remeron)  Oxycodone (Oxy IR)  Oxycodone (Roxicodone)  Pregabalin (Lyrica)  Tramadol (Ultram)  **Note: The testing scope of  this panel does not include small to  moderate amounts of these reported medications:  Acetaminophen (Tylenol)  Diclofenac (Voltaren)  Lidocaine (Lidoderm)  Tizanidine (Zanaflex)  Zolpidem (Ambien)  **Note: The testing scope of this panel does not include following  reported medications:  Albuterol (Proventil)  Albuterol (Ventolin HFA)  Budenoside (Symbicort)  Dicyclomine (Bentyl)  Formoterol (Symbicort)  Furosemide (Lasix)  Insulin (Humalog)  Insulin (Lantus)  Iron (Ferrous Sulfate)  Lactulose  Loperamide (Imodium)  Magnesium (Milk of Magnesia)  Metoclopramide (Reglan)  Omeprazole (Prilosec)  Ondansetron (Zofran)  Oxybutynin (Ditropan)  Potassium (K-Dur)  Potassium (Klor-Con)  Ropinirole  Simvastatin (Zocor)  Spironolactone (Aldactone)  Topical  Vitamin  C ==================================================================== For clinical consultation, please call [phone]. ====================================================================    UDS interpretation: Non-Compliant         UDS not completed today secondary to patient's increased fall risk Medication Assessment Form: Patient currently in a assisted living facility. Treatment compliance: Compliant Risk Assessment Profile: Aberrant behavior: See prior evaluations. None observed or detected today Comorbid factors increasing risk of overdose: bipolar disorder, caucasian and sleep apnea Risk of substance use disorder (SUD): High-to-Very High Opioid Risk Tool - 01/04/18 1117      Family History of Substance Abuse   Alcohol  Positive Female    Illegal Drugs  Positive Female    Rx Drugs  Positive Female or Female      Personal History of Substance Abuse   Alcohol  Negative    Illegal Drugs  Negative    Rx Drugs  Negative      Age   Age between 70-45 years   No      Psychological Disease   Psychological Disease  Positive    Bipolar  Positive    Depression  Positive      Total Score   Opioid Risk Tool Scoring  10    Opioid Risk Interpretation  High Risk      ORT Scoring interpretation table:  Score <3 = Low Risk for SUD  Score between 4-7 = Moderate Risk for SUD  Score >8 = High Risk for Opioid Abuse   Risk Mitigation Strategies:  Patient Counseling: Covered Patient-Prescriber Agreement (PPA): Present and active  Notification to other healthcare providers: Done  Pharmacologic Plan: No change in therapy, at this time.             Laboratory Chemistry  Inflammation Markers (CRP: Acute Phase) (ESR: Chronic Phase) Lab Results  Component Value Date   ESRSEDRATE 71 (H) 04/20/2016   LATICACIDVEN 1.1 07/20/2017                         Rheumatology Markers Lab Results  Component Value Date   ANA POS (A) 03/19/2012                        Renal Function  Markers Lab Results  Component Value Date   BUN 25 (H) 11/05/2017   CREATININE 0.92 11/05/2017   GFRAA >60 11/05/2017   GFRNONAA >60 11/05/2017                             Hepatic Function Markers Lab Results  Component Value Date   AST 27 11/05/2017   ALT 23 11/05/2017   ALBUMIN 3.5 11/05/2017   ALKPHOS 166 (H) 11/05/2017   HCVAB NEGATIVE 02/03/2012   AMYLASE 30 06/12/2014   LIPASE 17 12/09/2015  AMMONIA 45 (H) 07/21/2017                        Electrolytes Lab Results  Component Value Date   NA 137 11/05/2017   K 3.7 11/05/2017   CL 101 11/05/2017   CALCIUM 9.1 11/05/2017   MG 2.1 07/21/2017   PHOS 3.7 07/21/2017                        Neuropathy Markers Lab Results  Component Value Date   VITAMINB12 431 11/13/2015   FOLATE 29.0 11/13/2015   HGBA1C 5.2 07/20/2016                        Bone Pathology Markers Lab Results  Component Value Date   VD25OH 30 11/12/2012   VD125OH2TOT 10 (L) 06/19/2008                         Coagulation Parameters Lab Results  Component Value Date   INR 1.00 03/07/2017   LABPROT 13.1 03/07/2017   APTT 37 (H) 03/07/2017   PLT 119 (L) 11/05/2017                        Cardiovascular Markers Lab Results  Component Value Date   BNP 108.0 (H) 07/28/2016   CKTOTAL 119 06/30/2012   CKMB 0.6 06/30/2012   TROPONINI <0.03 11/05/2017   HGB 12.1 11/05/2017   HCT 35.3 11/05/2017                         CA Markers No results found for: CEA, CA125, LABCA2                      Note: Lab results reviewed.  Recent Diagnostic Imaging Results  DG Knee Complete 4 Views Left CLINICAL DATA:  Fall, left knee pain  EXAM: LEFT KNEE - COMPLETE 4+ VIEW  COMPARISON:  11/30/2017  FINDINGS: No acute bony abnormality. Specifically, no fracture, subluxation, or dislocation. No joint effusion. Joint spaces maintained.  IMPRESSION: No acute bony abnormality.  Electronically Signed   By: Rolm Baptise M.D.   On: 12/31/2017 18:57 DG  Hip Unilat W or Wo Pelvis 2-3 Views Left CLINICAL DATA:  Fall, left hip and knee pain  EXAM: DG HIP (WITH OR WITHOUT PELVIS) 2-3V LEFT  COMPARISON:  11/05/2017  FINDINGS: Enthesopathic changes on the greater trochanter. No acute bony abnormality. Specifically, no fracture, subluxation, or dislocation. Hip joints and SI joints are symmetric.  IMPRESSION: No acute bony abnormality.  Electronically Signed   By: Rolm Baptise M.D.   On: 12/31/2017 18:56  Complexity Note: Imaging results reviewed. Results shared with Stephanie [name], using [employer].                         Meds   Current Outpatient Medications:  .  acetaminophen (TYLENOL) 325 MG tablet, Take 650 mg by mouth every 6 (six) hours as needed for mild pain., Disp: , Rfl:  .  albuterol (PROVENTIL HFA;VENTOLIN HFA) 108 (90 Base) MCG/ACT inhaler, Inhale 2 puffs into the lungs every 6 (six) hours as needed for wheezing or shortness of breath., Disp: , Rfl:  .  albuterol (PROVENTIL) (2.5 MG/3ML) 0.083% nebulizer solution, Take 2.5 mg by nebulization every 6 (six) hours as needed  for wheezing or shortness of breath., Disp: , Rfl:  .  budesonide-formoterol (SYMBICORT) 160-4.5 MCG/ACT inhaler, Inhale 2 puffs into the lungs 2 (two) times daily. , Disp: , Rfl:  .  buPROPion (WELLBUTRIN XL) 150 MG 24 hr tablet, Take 1 tablet (150 mg total) by mouth daily., Disp: 90 tablet, Rfl: 1 .  clonazePAM (KLONOPIN) 0.5 MG tablet, Take 0.5 mg by mouth 3 (three) times daily. , Disp: , Rfl:  .  Colloidal Oatmeal (EUCERIN ECZEMA RELIEF) 1 % CREA, Apply 1 application topically 2 (two) times daily. Apply to right lower leg twice daily., Disp: , Rfl:  .  diclofenac sodium (VOLTAREN) 1 % GEL, Apply 2 g topically every 8 (eight) hours as needed (arthritis). Apply to right knee, Disp: , Rfl:  .  dicyclomine (BENTYL) 10 MG capsule, Take 10 mg by mouth 3 (three) times daily. , Disp: , Rfl:  .  ferrous sulfate 325 (65 FE) MG tablet, Take 325 mg by mouth 2  (two) times daily. , Disp: , Rfl:  .  furosemide (LASIX) 80 MG tablet, Take 80 mg by mouth., Disp: , Rfl:  .  gabapentin (NEURONTIN) 600 MG tablet, Take 1 tablet (600 mg total) by mouth 3 (three) times daily., Disp: 90 tablet, Rfl: 2 .  hydrochlorothiazide (MICROZIDE) 12.5 MG capsule, Take 12.5 mg by mouth daily with supper., Disp: , Rfl:  .  insulin glargine (LANTUS) 100 UNIT/ML injection, Inject 50 Units into the skin every evening. , Disp: , Rfl:  .  insulin lispro (HUMALOG) 100 UNIT/ML injection, Inject 8 Units into the skin 3 (three) times daily before meals. , Disp: , Rfl:  .  lactulose (CHRONULAC) 10 GM/15ML solution, Take 10 g by mouth daily. (0800), Disp: , Rfl:  .  lamoTRIgine (LAMICTAL) 100 MG tablet, Take 1 tablet (100 mg total) by mouth 2 (two) times daily. (0800 & 2000), Disp: 180 tablet, Rfl: 1 .  lamoTRIgine (LAMICTAL) 25 MG tablet, Take 1 tablet (25 mg total) by mouth daily. To be added to the Lamictal 100 mg in the AM, Disp: 90 tablet, Rfl: 1 .  lidocaine (LIDODERM) 5 %, Place 2 patches onto the skin daily. Apply 1 patch to 2 areas on back (2 patches) every morning and remove at bedtime., Disp: , Rfl:  .  liraglutide (VICTOZA) 18 MG/3ML SOPN, Inject 1.8 mg into the skin daily., Disp: , Rfl:  .  LYRICA 150 MG capsule, Take 150 mg by mouth 3 (three) times daily. (0800, 1400 & 2000), Disp: , Rfl:  .  metFORMIN (GLUCOPHAGE-XR) 500 MG 24 hr tablet, Take 500 mg by mouth twice daily., Disp: , Rfl:  .  metoCLOPramide (REGLAN) 5 MG tablet, Take 5 mg by mouth 4 (four) times daily -  before meals and at bedtime. (0800, 1200, 1600 & 2000), Disp: , Rfl:  .  neomycin-bacitracin-polymyxin (NEOSPORIN) [phone] ointment, Apply 1 application topically every 12 (twelve) hours as needed (to right forearm)., Disp: , Rfl:  .  nortriptyline (PAMELOR) 10 MG capsule, Take 2 capsules (20 mg total) by mouth at bedtime. For sleep, pain, anxiety, Disp: 180 capsule, Rfl: 1 .  nystatin (MYCOSTATIN/NYSTOP)  powder, Apply topically 4 (four) times daily. (Patient taking differently: Apply topically 4 (four) times daily. Apply to inner thigh/ peri area twice daily and apply to abdomen 4 times daily.), Disp: 15 g, Rfl: 0 .  omeprazole (PRILOSEC) 20 MG capsule, Take 20 mg by mouth daily. , Disp: , Rfl:  .  ondansetron (ZOFRAN) 4 MG  tablet, Take 4 mg by mouth every 4 (four) hours as needed for nausea or vomiting., Disp: , Rfl:  .  oxybutynin (DITROPAN-XL) 5 MG 24 hr tablet, Take 5 mg by mouth daily. (0800), Disp: , Rfl:  .  rOPINIRole (REQUIP) 3 MG tablet, Take 3 mg by mouth at bedtime. (2000), Disp: , Rfl:  .  senna (SENOKOT) 8.6 MG tablet, Take 2 tablets by mouth at bedtime., Disp: , Rfl:  .  spironolactone (ALDACTONE) 50 MG tablet, Take 50 mg by mouth 2 (two) times daily. (0800), Disp: , Rfl:  .  vitamin C (ASCORBIC ACID) 500 MG tablet, Take 500 mg by mouth 2 (two) times daily. (0800 & 2000), Disp: , Rfl:  .  [START ON 03/17/2018] HYDROcodone-acetaminophen (NORCO) 7.5-325 MG tablet, Take 1 tablet by mouth every 6 (six) hours as needed for moderate pain., Disp: 120 tablet, Rfl: 0 .  [START ON 02/15/2018] HYDROcodone-acetaminophen (NORCO) 7.5-325 MG tablet, Take 1 tablet by mouth every 6 (six) hours as needed for moderate pain., Disp: 120 tablet, Rfl: 0 .  [START ON 01/16/2018] HYDROcodone-acetaminophen (NORCO) 7.5-325 MG tablet, Take 1 tablet by mouth every 6 (six) hours as needed for moderate pain., Disp: 120 tablet, Rfl: 0 .  potassium chloride (K-DUR,KLOR-CON) 10 MEQ tablet, Take 1 tablet (10 mEq total) by mouth daily for 4 days., Disp: 4 tablet, Rfl: 0  ROS  Constitutional: Denies any fever or chills Gastrointestinal: No reported hemesis, hematochezia, vomiting, or acute GI distress Musculoskeletal: Denies any acute onset joint swelling, redness, loss of ROM, or weakness Neurological: No reported episodes of acute onset apraxia, aphasia, dysarthria, agnosia, amnesia, paralysis, loss of coordination, or  loss of consciousness  Allergies  Stephanie [name] is allergic to doxycycline.  [place]  Drug: Stephanie [name]  reports that she does not use drugs. Alcohol:  reports that she does not drink alcohol. Tobacco:  reports that she quit smoking about 4 years ago. Her smoking use included cigarettes. She has a 75.00 pack-year smoking history. She has never used smokeless tobacco. Medical:  has a past medical history of Adenomatous colon polyp (08/27/2014), Anemia, Anxiety, Aortic dissection ([place]), Arthritis, Asthma, Bipolar affective disorder ([place]), Blood transfusion (2000), Blood transfusion without reported diagnosis, Cancer ([place]), Chronic abdominal pain, Cirrhosis ([place]), Colon polyp, Depression, Diabetes mellitus without complication ([place]), Fatty liver (04/09/08), GERD (gastroesophageal reflux disease), H/O: CVA (cardiovascular accident), H/O: rheumatic fever, Hepatitis, Hyperlipidemia, Incontinence, Insomnia, Lower extremity edema, Obstructive sleep apnea, Platelets decreased ([place]), RLS (restless legs syndrome), Shortness of breath, Sleep apnea, Stroke ([place]), Ulcer, and Unspecified disorders of nervous system. Surgical: Stephanie [name]  has a past surgical history that includes Cholecystectomy; Rotator cuff repair; Abdominal hysterectomy; Lumbar fusion (10/09); Hemorroidectomy; Abdominal exploration surgery; Posterior cervical fusion/foraminotomy; Lumbar wound debridement (05/09/2011); Axillary artery cannulation via 8-mm Hemashield graft, median sternotomy, extracorporeal circulation with deep hypothermic circulatory arrest, repair of  aortic dessection (01/23/2009); cataract; Colonoscopy; Liver biopsy (04/10/2012); Back surgery; Eye surgery (Bilateral); and Knee arthroscopy with lateral release (Right, 11/15/2016). Family: family history includes Anxiety disorder in her brother; Breast cancer in her mother; Depression in her brother; Diabetes in her unknown relative; Lung cancer in her mother; Stomach cancer in her  father.  Constitutional Exam  General appearance: Well nourished, well developed, and well hydrated. In no apparent acute distress Vitals:   01/04/18 1101  BP: (!) 119/45  Pulse: 78  Resp: 16  Temp: 97.8 F (36.6 C)  TempSrc: Oral  SpO2: 98%  Weight: 221 lb (100.2 kg)  Height: _0  (  1.6 m)   BMI Assessment: Estimated body mass index is 39.15 kg/m as calculated from the following:   Height as of this encounter: _0  (1.6 m).   Weight as of this encounter: 221 lb (100.2 kg).  BMI interpretation table: BMI level Category Range association with higher incidence of chronic pain  <18 kg/m2 Underweight   18.5-24.9 kg/m2 Ideal body weight   25-29.9 kg/m2 Overweight Increased incidence by 20%  30-34.9 kg/m2 Obese (Class I) Increased incidence by 68%  35-39.9 kg/m2 Severe obesity (Class II) Increased incidence by 136%  >40 kg/m2 Extreme obesity (Class III) Increased incidence by 254%   Patient's current BMI Ideal Body weight  Body mass index is 39.15 kg/m. Ideal body weight: 52.4 kg (115 lb 8.3 oz) Adjusted ideal body weight: 71.5 kg (157 lb 11.4 oz)   BMI Readings from Last 4 Encounters:  01/04/18 39.15 kg/m  01/02/18 39.33 kg/m  12/31/17 38.97 kg/m  11/30/17 39.33 kg/m   Wt Readings from Last 4 Encounters:  01/04/18 221 lb (100.2 kg)  01/02/18 222 lb (100.7 kg)  12/31/17 220 lb (99.8 kg)  11/30/17 222 lb (100.7 kg)  Psych/Mental status: Alert, oriented x 3 (person, place, & time)       Eyes: PERLA Respiratory: No evidence of acute respiratory distress  Skin: multiple bruising  Gait & Posture Assessment  Ambulation: Patient ambulates using a wheel chair Gait: Limited. Using assistive device to ambulate   Lower Extremity Exam    Side: Right lower extremity  Side: Left lower extremity  Stability: No instability observed          Stability: No instability observed          Skin & Extremity Inspection: Edema  Skin & Extremity Inspection: Skin color, temperature,  and hair growth are WNL. No peripheral edema or cyanosis. No masses, redness, swelling, asymmetry, or associated skin lesions. No contractures.  Functional ROM: Guarding                  Functional ROM: Guarding                  Muscle Tone/Strength: Functionally intact. No obvious neuro-muscular anomalies detected.  Muscle Tone/Strength: Functionally intact. No obvious neuro-muscular anomalies detected.  Sensory (Neurological): Unimpaired  Sensory (Neurological): Unimpaired  Palpation: No palpable anomalies  Palpation: No palpable anomalies   Assessment  Primary Diagnosis & Pertinent Problem List: The primary encounter diagnosis was Tricompartment osteoarthritis of right knee. Diagnoses of Primary osteoarthritis of right knee, Chronic bilateral low back pain, with sciatica presence unspecified, Cervical stenosis of spine, and Chronic pain syndrome were also pertinent to this visit.  Status Diagnosis  Worsening Worsening Persistent 1. Tricompartment osteoarthritis of right knee   2. Primary osteoarthritis of right knee   3. Chronic bilateral low back pain, with sciatica presence unspecified   4. Cervical stenosis of spine   5. Chronic pain syndrome     Problems updated and reviewed during this visit: No problems updated. Plan of Care  Pharmacotherapy (Medications Ordered): Meds ordered this encounter  Medications  . gabapentin (NEURONTIN) 600 MG tablet    Sig: Take 1 tablet (600 mg total) by mouth 3 (three) times daily.    Dispense:  90 tablet    Refill:  2    Order Specific Question:   Supervising Provider    Answer:   Milinda Pointer [phone]  . HYDROcodone-acetaminophen (NORCO) 7.5-325 MG tablet    Sig: Take 1 tablet by mouth every 6 (  six) hours as needed for moderate pain.    Dispense:  120 tablet    Refill:  0    Do not place this medication, or any other prescription from our practice, on "Automatic Refill". Patient may have prescription filled one day early if pharmacy  is closed on scheduled refill date. May fill: 03/17/2018 to last until :03/17/2018    Order Specific Question:   Supervising Provider    Answer:   Milinda Pointer [phone]  . HYDROcodone-acetaminophen (NORCO) 7.5-325 MG tablet    Sig: Take 1 tablet by mouth every 6 (six) hours as needed for moderate pain.    Dispense:  120 tablet    Refill:  0    Do not place this medication, or any other prescription from our practice, on "Automatic Refill". Patient may have prescription filled one day early if pharmacy is closed on scheduled refill date. Do not fill until: 02/15/2018 To last until:03/17/2018    Order Specific Question:   Supervising Provider    Answer:   Milinda Pointer [phone]  . HYDROcodone-acetaminophen (NORCO) 7.5-325 MG tablet    Sig: Take 1 tablet by mouth every 6 (six) hours as needed for moderate pain.    Dispense:  120 tablet    Refill:  0    Do not place this medication, or any other prescription from our practice, on "Automatic Refill". Patient may have prescription filled one day early if pharmacy is closed on scheduled refill date. Do not fill until: 01/16/2018 To last until:02/15/2018    Order Specific Question:   Supervising Provider    Answer:   Milinda Pointer [phone]   New Prescriptions   HYDROCODONE-ACETAMINOPHEN (NORCO) 7.5-325 MG TABLET    Take 1 tablet by mouth every 6 (six) hours as needed for moderate pain.   HYDROCODONE-ACETAMINOPHEN (NORCO) 7.5-325 MG TABLET    Take 1 tablet by mouth every 6 (six) hours as needed for moderate pain.   Medications administered today: Stephanie [name]. Fake had no medications administered during this visit. Lab-work, procedure(s), and/or referral(s): No orders of the defined types were placed in this encounter.  Imaging and/or referral(s): None  Interventional therapies: Planned, scheduled, and/or pending:   Not at this time.    Provider-requested follow-up: Return in about 3 months (around 04/06/2018) for MedMgmt  with Me Donella Stade Edison Pace).  Future Appointments  Date Time Provider [place]  01/08/2018 11:00 AM [facility]  01/18/2018 10:00 AM Ursula Alert, MD [facility] None  02/12/2018 10:30 AM Lequita Asal, MD [facility] None  04/09/2018  9:30 AM Vevelyn Francois, NP [facility] None  07/03/2018 10:15 AM Dew, Erskine Squibb, MD [facility] None   Primary Care Physician: Sarajane Jews, MD Location: [facility] Note by: Vevelyn Francois NP Date: 01/04/2018; Time: 4:39 PM  Pain Score Disclaimer: We use the NRS-11 scale. This is a self-reported, subjective measurement of pain severity with only modest accuracy. It is used primarily to identify changes within a particular patient. It must be understood that outpatient pain scales are significantly less accurate that those used for research, where they can be applied under ideal controlled circumstances with minimal exposure to variables. In reality, the score is likely to be a combination of pain intensity and pain affect, where pain affect describes the degree of emotional arousal or changes in action readiness caused by the sensory experience of pain. Factors such as social and work situation, setting, emotional state, anxiety levels, expectation, and prior pain experience may influence pain  perception and show large inter-individual differences that may also be affected by time variables.  Patient instructions provided during this appointment: Patient Instructions  You have been given 3 Rx for Hydrocodone with acetaminophen to last until 04/16/2018. Gabapentin has been escribed to your pharmacy. ___________________________________________________________________________________________  Medication Rules  Applies to: All patients receiving prescriptions (written or electronic).  Pharmacy of record: Pharmacy where electronic prescriptions will be sent. If written prescriptions are taken to a different pharmacy,  please inform the nursing staff. The pharmacy listed in the electronic medical record should be the one where you would like electronic prescriptions to be sent.  Prescription refills: Only during scheduled appointments. Applies to both, written and electronic prescriptions.  NOTE: The following applies primarily to controlled substances (Opioid* Pain Medications).   Patient's responsibilities: 1. Pain Pills: Bring all pain pills to every appointment (except for procedure appointments). 2. Pill Bottles: Bring pills in original pharmacy bottle. Always bring newest bottle. Bring bottle, even if empty. 3. Medication refills: You are responsible for knowing and keeping track of what medications you need refilled. The day before your appointment, write a list of all prescriptions that need to be refilled. Bring that list to your appointment and give it to the admitting nurse. Prescriptions will be written only during appointments. If you forget a medication, it will not be "Called in", "Faxed", or "electronically sent". You will need to get another appointment to get these prescribed. 4. Prescription Accuracy: You are responsible for carefully inspecting your prescriptions before leaving our office. Have the discharge nurse carefully go over each prescription with you, before taking them home. Make sure that your name is accurately spelled, that your address is correct. Check the name and dose of your medication to make sure it is accurate. Check the number of pills, and the written instructions to make sure they are clear and accurate. Make sure that you are given enough medication to last until your next medication refill appointment. 5. Taking Medication: Take medication as prescribed. Never take more pills than instructed. Never take medication more frequently than prescribed. Taking less pills or less frequently is permitted and encouraged, when it comes to controlled substances (written prescriptions).   6. Inform other Doctors: Always inform, all of your healthcare providers, of all the medications you take. 7. Pain Medication from other Providers: You are not allowed to accept any additional pain medication from any other Doctor or Healthcare provider. There are two exceptions to this rule. (see below) In the event that you require additional pain medication, you are responsible for notifying us, as stated below. 8. Medication Agreement: You are responsible for carefully reading and following our Medication Agreement. This must be signed before receiving any prescriptions from our practice. Safely store a copy of your signed Agreement. Violations to the Agreement will result in no further prescriptions. (Additional copies of our Medication Agreement are available upon request.) 9. Laws, Rules, & Regulations: All patients are expected to follow all Federal and [employer], Rules, [employer]. Ignorance of the Laws does not constitute a valid excuse. The use of any illegal substances is prohibited. 10. Adopted CDC guidelines & recommendations: Target dosing levels will be at or below 60 MME/day. Use of benzodiazepines** is not recommended.  Exceptions: There are only two exceptions to the rule of not receiving pain medications from other Healthcare Providers. 1. Exception #1 (Emergencies): In the event of an emergency (i.e.: accident requiring emergency care), you are allowed to receive additional pain medication. However,  you are responsible for: As soon as you are able, call our office [phone], at any time of the day or night, and leave a message stating your name, the date and nature of the emergency, and the name and dose of the medication prescribed. In the event that your call is answered by a member of our staff, make sure to document and save the date, time, and the name of the person that took your information.  2. Exception #2 (Planned Surgery): In the event that you are  scheduled by another doctor or dentist to have any type of surgery or procedure, you are allowed (for a period no longer than 30 days), to receive additional pain medication, for the acute post-op pain. However, in this case, you are responsible for picking up a copy of our "Post-op Pain Management for Surgeons" handout, and giving it to your surgeon or dentist. This document is available at our office, and does not require an appointment to obtain it. Simply go to our office during business hours (Monday-Thursday from 8:00 AM to 4:00 PM) (Friday 8:00 AM to 12:00 Noon) or if you have a scheduled appointment with [name], prior to your surgery, and ask for it by name. In addition, you will need to provide [name] with your name, name of your surgeon, type of surgery, and date of procedure or surgery.  *Opioid medications include: morphine, codeine, oxycodone, oxymorphone, hydrocodone, hydromorphone, meperidine, tramadol, tapentadol, buprenorphine, fentanyl, methadone. **Benzodiazepine medications include: diazepam (Valium), alprazolam (Xanax), clonazepam (Klonopine), lorazepam (Ativan), clorazepate (Tranxene), chlordiazepoxide (Librium), estazolam (Prosom), oxazepam (Serax), temazepam (Restoril), triazolam (Halcion) (Last updated: 08/03/2017) ____________________________________________________________________________________________

## 2018-01-08 ENCOUNTER — Ambulatory Visit
Admission: RE | Admit: 2018-01-08 | Discharge: 2018-01-08 | Disposition: A | Discharge status: Home / Self Care | Payer: Medicare Other | Source: Ambulatory Visit | Attending: Gastroenterology | Admitting: Gastroenterology

## 2018-01-08 DIAGNOSIS — K7460 Unspecified cirrhosis of liver: Secondary | ICD-10-CM | POA: Diagnosis present

## 2018-01-11 ENCOUNTER — Ambulatory Visit: Payer: Medicaid Other | Admitting: Licensed Clinical Social Worker

## 2018-01-18 ENCOUNTER — Ambulatory Visit (INDEPENDENT_AMBULATORY_CARE_PROVIDER_SITE_OTHER): Payer: Medicare Other | Admitting: Psychiatry

## 2018-01-18 ENCOUNTER — Encounter: Payer: Self-pay | Admitting: Psychiatry

## 2018-01-18 ENCOUNTER — Other Ambulatory Visit: Payer: Self-pay

## 2018-01-18 VITALS — BP 109/71 | HR 80 | Temp 97.9°F

## 2018-01-18 DIAGNOSIS — F3130 Bipolar disorder, current episode depressed, mild or moderate severity, unspecified: Secondary | ICD-10-CM | POA: Diagnosis not present

## 2018-01-18 DIAGNOSIS — F411 Generalized anxiety disorder: Secondary | ICD-10-CM

## 2018-01-18 DIAGNOSIS — F5105 Insomnia due to other mental disorder: Secondary | ICD-10-CM | POA: Diagnosis not present

## 2018-01-18 MED ORDER — LAMOTRIGINE 100 MG PO TABS: 100.0000 mg | ORAL_TABLET | Freq: Two times a day (BID) | ORAL | 1 refills | Status: DC

## 2018-01-18 MED ORDER — LAMOTRIGINE 25 MG PO TABS: 25.0000 mg | ORAL_TABLET | Freq: Two times a day (BID) | ORAL | 1 refills | Status: DC

## 2018-01-18 MED ORDER — BUPROPION HCL ER (XL) 150 MG PO TB24: 150.0000 mg | ORAL_TABLET | Freq: Every day | ORAL | 1 refills | Status: DC

## 2018-01-18 NOTE — Progress Notes (Signed)
[facility] MD OP Progress Note  01/18/2018 10:40 AM Stephanie [name]  MRN:  [id number]  Chief Complaint: ' I am here for follow up.' Chief Complaint    Follow-up; Medication Refill     HPI: Stephanie [name] is a 67 year old Caucasian female, divorced, lives at [facility] [place], on [place], has a history of bipolar disorder, generalized anxiety disorder, RLS, chronic pain, insomnia as well as multiple medical problems including COPD, history of lung nodules, sleep apnea, ascites, status post cholecystectomy, history of hepatitis B, history of stroke, history of vitamin D deficiency, history of aortic dissection, hyperlipidemia, hypertension, thrombocytopenia, chronic anemia, presented to the clinic today for a follow-up visit.  Patient today presented along with staff Kieth Brightly at [facility].  Recent is in a wheelchair.  She continues to be tearful and sad about her chronic pain and knee problems.  She reports she has upcoming appointment with surgeon however she is not sure if she is going to have surgery or not.  She reports she is also worried that she is going to be transition to a skilled nursing facility since she needs more support.  Patient reports she worries about that all the time and that makes her panic sometimes.  Patient reports sleep continues to be restless due to her pain.  She does like the effect of Pamelor and reports that helps to some extent.  Patient continues to be compliant with her medications as prescribed.  Patient denies any suicidality.  Patient denies any perceptual disturbances.  Discussed with patient about more frequent psychotherapy visits however due to scheduling problems she has been unable to see Ms. Peacock here.  Provided her with a list of therapist in the community.  Discussed with patient as well as staff to make an appointment with another therapist. Visit Diagnosis:    ICD-10-CM   1. Bipolar I disorder, most recent episode depressed (HCC) F31.30 lamoTRIgine (LAMICTAL) 25 MG  tablet    lamoTRIgine (LAMICTAL) 100 MG tablet    buPROPion (WELLBUTRIN XL) 150 MG 24 hr tablet  2. GAD (generalized anxiety disorder) F41.1 buPROPion (WELLBUTRIN XL) 150 MG 24 hr tablet  3. Insomnia due to mental disorder F51.05     Past Psychiatric History: I have reviewed past psychiatric history from my progress note on 08/24/2017.  Past trials of Seroquel, trazodone, Ambien.  Past Medical History:  Past Medical History:  Diagnosis Date  . Adenomatous colon polyp 08/27/2014   Polyps x 3  . Anemia   . Anxiety   . 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 Arthritis   . Asthma   . Bipolar affective disorder ([place])    h/o  . Blood transfusion 2000  . Blood transfusion without reported diagnosis   . Cancer ([place])   . Chronic abdominal pain    abdominal wall pain  . 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 Colon polyp   . Depression   . Diabetes mellitus without complication ([place])   . Fatty liver 04/09/08   found in abd CT  . GERD (gastroesophageal reflux disease)   . H/O: CVA (cardiovascular accident)    TIA  . H/O: rheumatic fever   . Hepatitis    HEP "B" twenty years ago  . Hyperlipidemia   . Incontinence   . Insomnia   . Lower extremity edema   . Obstructive sleep apnea    Use C-PAP twice per week  . Platelets decreased ([place] [place] [facility])   . RLS (restless legs syndrome)   .  Shortness of breath   . Sleep apnea   . Stroke [facility])    TIA- 2002  . Ulcer   . Unspecified disorders of nervous system     Past Surgical History:  Procedure Laterality Date  . ABDOMINAL EXPLORATION SURGERY    . ABDOMINAL HYSTERECTOMY     total  . Axillary artery cannulation via 8-mm Hemashield graft, median sternotomy, extracorporeal circulation with deep hypothermic circulatory arrest, repair of  aortic dessection  01/23/2009   Dr Arlyce Dice  . BACK SURGERY      x 5  . cataract     both eyes  . CHOLECYSTECTOMY    . COLONOSCOPY    . EYE SURGERY Bilateral    Cataract Extraction with IOL  .  HEMORROIDECTOMY     and colon polyp removed  . KNEE ARTHROSCOPY WITH LATERAL RELEASE Right 11/15/2016   Procedure: KNEE ARTHROSCOPY WITH LATERAL RELEASE;  Surgeon: Corky Mull, MD;  Location: [facility] ORS;  Service: Orthopedics;  Laterality: Right;  . LIVER BIOPSY  04/10/2012   Procedure: LIVER BIOPSY;  Surgeon: Inda Castle, MD;  Location: [facility];  Service: Endoscopy;  Laterality: N/A;  ultrasound to mark order for abd limited/liver to be marked to be sent by [email or web address]   . LUMBAR FUSION  10/09  . LUMBAR WOUND DEBRIDEMENT  05/09/2011   Procedure: LUMBAR WOUND DEBRIDEMENT;  Surgeon: Dahlia Bailiff;  Location: [facility];  Service: Orthopedics;  Laterality: N/A;  IRRIGATION AND DEBRIDEMENT SPINAL WOUND  . POSTERIOR CERVICAL FUSION/FORAMINOTOMY    . ROTATOR CUFF REPAIR     left    Family Psychiatric History: Have reviewed family psychiatric history from my progress note on 08/24/2017.  Family History:  Family History  Problem Relation Age of Onset  . Breast cancer Mother   . Lung cancer Mother   . Stomach cancer Father   . Diabetes Unknown        4 aunts, and 1 uncle  . Depression Brother   . Anxiety disorder Brother   . Colon cancer Neg Hx   . Esophageal cancer Neg Hx   . Rectal cancer Neg Hx   Substance abuse history: Denies   Social History: Reviewed social history from my progress note on 08/24/2017. Social History   Socioeconomic History  . Marital status: Divorced    Spouse name: Not on file  . Number of children: 2  . Years of education: Not on file  . Highest education level: 8th grade  Occupational History  . Occupation: Disabled    Employer: [employer]  Social Needs  . Financial resource strain: Not hard at all  . Food insecurity:    Worry: Never true    Inability: Never true  . Transportation needs:    Medical: No    Non-medical: No  Tobacco Use  . Smoking status: Former Smoker    Packs/day: 1.50    Years: 50.00    Pack years: 75.00    Types:  Cigarettes    Last attempt to quit: 04/06/2013    Years since quitting: 4.7  . Smokeless tobacco: Never Used  Substance and Sexual Activity  . Alcohol use: No    Alcohol/week: 0.0 standard drinks  . Drug use: No  . Sexual activity: Never  Lifestyle  . Physical activity:    Days per week: 0 days    Minutes per session: 0 min  . Stress: Very much  Relationships  . Social connections:    Talks on phone: Not on file  Gets together: Not on file    Attends religious service: Never    Active member of club or organization: No    Attends meetings of clubs or organizations: Never    Relationship status: Divorced  Other Topics Concern  . Not on file  Social History Narrative   1 son, 14+ y/o   [name] caffeine use: 2 cups [name]   Does not get regular exercise   Disability due to bipolar      Has a living will- she is a DNR (she has form at home per pt).    Allergies:  Allergies  Allergen Reactions  . Doxycycline Hives, Swelling, Other (See Comments) and Nausea Only    Reaction:  Facial swelling  Face red and swollen; no difficulty breathing    Metabolic Disorder Labs: Lab Results  Component Value Date   HGBA1C 5.2 07/20/2016   MPG 103 07/20/2016   MPG 131 04/18/2016   No results found for: PROLACTIN Lab Results  Component Value Date   CHOL 194 04/05/2016   TRIG 47 04/05/2016   HDL 110 04/05/2016   CHOLHDL 1.8 04/05/2016   VLDL 9 04/05/2016   LDLCALC 75 04/05/2016   LDLCALC 59 12/15/2014   Lab Results  Component Value Date   TSH 4.486 07/19/2017   TSH 4.24 12/15/2014    Therapeutic Level Labs: No results found for: LITHIUM No results found for: VALPROATE No components found for:  CBMZ  Current Medications: Current Outpatient Medications  Medication Sig Dispense Refill  . acetaminophen (TYLENOL) 325 MG tablet Take 650 mg by mouth every 6 (six) hours as needed for mild pain.    Marland Kitchen albuterol (PROVENTIL HFA;VENTOLIN HFA) 108 (90 Base) MCG/ACT inhaler Inhale 2  puffs into the lungs every 6 (six) hours as needed for wheezing or shortness of breath.    Marland Kitchen albuterol (PROVENTIL) (2.5 MG/3ML) 0.083% nebulizer solution Take 2.5 mg by nebulization every 6 (six) hours as needed for wheezing or shortness of breath.    . budesonide-formoterol (SYMBICORT) 160-4.5 MCG/ACT inhaler Inhale 2 puffs into the lungs 2 (two) times [name].     Marland Kitchen buPROPion (WELLBUTRIN XL) 150 MG 24 hr tablet Take 1 tablet (150 mg total) by mouth [name]. 90 tablet 1  . clonazePAM (KLONOPIN) 0.5 MG tablet Take 0.5 mg by mouth 3 (three) times [name].     . Colloidal Oatmeal (EUCERIN ECZEMA RELIEF) 1 % CREA Apply 1 application topically 2 (two) times [name]. Apply to right lower leg twice [name].    . diclofenac sodium (VOLTAREN) 1 % GEL Apply 2 g topically every 8 (eight) hours as needed (arthritis). Apply to right knee    . dicyclomine (BENTYL) 10 MG capsule Take 10 mg by mouth 3 (three) times [name].     . ferrous sulfate 325 (65 FE) MG tablet Take 325 mg by mouth 2 (two) times [name].     . furosemide (LASIX) 80 MG tablet Take 80 mg by mouth.    . gabapentin (NEURONTIN) 600 MG tablet Take 1 tablet (600 mg total) by mouth 3 (three) times [name]. 90 tablet 2  . hydrochlorothiazide (MICROZIDE) 12.5 MG capsule Take 12.5 mg by mouth [name] with supper.    Derrill Memo ON 03/17/2018] HYDROcodone-acetaminophen (NORCO) 7.5-325 MG tablet Take 1 tablet by mouth every 6 (six) hours as needed for moderate pain. 120 tablet 0  . insulin glargine (LANTUS) 100 UNIT/ML injection Inject 50 Units into the skin every evening.     . insulin lispro (HUMALOG) 100  UNIT/ML injection Inject 8 Units into the skin 3 (three) times [name] before meals.     . lactulose (CHRONULAC) 10 GM/15ML solution Take 10 g by mouth [name]. (0800)    . lamoTRIgine (LAMICTAL) 100 MG tablet Take 1 tablet (100 mg total) by mouth 2 (two) times [name]. (0800 & 2000) 180 tablet 1  . lamoTRIgine (LAMICTAL) 25 MG tablet Take 1 tablet (25 mg total) by mouth 2  (two) times [name]. To be added to the Lamictal 100 mg at 8 AM and 8 pm 180 tablet 1  . lidocaine (LIDODERM) 5 % Place 2 patches onto the skin [name]. Apply 1 patch to 2 areas on back (2 patches) every morning and remove at bedtime.    . liraglutide (VICTOZA) 18 MG/3ML SOPN Inject 1.8 mg into the skin [name].    Marland Kitchen LYRICA 150 MG capsule Take 150 mg by mouth 3 (three) times [name]. (0800, 1400 & 2000)    . metFORMIN (GLUCOPHAGE-XR) 500 MG 24 hr tablet Take 500 mg by mouth twice [name].    . metoCLOPramide (REGLAN) 5 MG tablet Take 5 mg by mouth 4 (four) times [name] -  before meals and at bedtime. (0800, 1200, 1600 & 2000)    . neomycin-bacitracin-polymyxin (NEOSPORIN) [phone] ointment Apply 1 application topically every 12 (twelve) hours as needed (to right forearm).    . nortriptyline (PAMELOR) 10 MG capsule Take 2 capsules (20 mg total) by mouth at bedtime. For sleep, pain, anxiety 180 capsule 1  . nystatin (MYCOSTATIN/NYSTOP) powder Apply topically 4 (four) times [name]. (Patient taking differently: Apply topically 4 (four) times [name]. Apply to inner thigh/ peri area twice [name] and apply to abdomen 4 times [name].) 15 g 0  . omeprazole (PRILOSEC) 20 MG capsule Take 20 mg by mouth [name].     . ondansetron (ZOFRAN) 4 MG tablet Take 4 mg by mouth every 4 (four) hours as needed for nausea or vomiting.    Marland Kitchen oxybutynin (DITROPAN-XL) 5 MG 24 hr tablet Take 5 mg by mouth [name]. (0800)    . rOPINIRole (REQUIP) 3 MG tablet Take 3 mg by mouth at bedtime. (2000)    . senna (SENOKOT) 8.6 MG tablet Take 2 tablets by mouth at bedtime.    Marland Kitchen spironolactone (ALDACTONE) 50 MG tablet Take 50 mg by mouth 2 (two) times [name]. (0800)    . vitamin C (ASCORBIC ACID) 500 MG tablet Take 500 mg by mouth 2 (two) times [name]. (0800 & 2000)    . MYRBETRIQ 25 MG TB24 tablet     . potassium chloride (K-DUR,KLOR-CON) 10 MEQ tablet Take 1 tablet (10 mEq total) by mouth [name] for 4 days. 4 tablet 0  . RESTASIS 0.05 % ophthalmic  emulsion      No current facility-administered medications for this visit.      Musculoskeletal: Strength & Muscle Tone: within normal limits Gait & Station: in a wheelchair Patient leans: N/A  Psychiatric Specialty Exam: Review of Systems  Musculoskeletal: Positive for joint pain.  Psychiatric/Behavioral: Positive for depression. The patient is nervous/anxious and has insomnia (restless mostly due to pain).   All other systems reviewed and are negative.   Blood pressure 109/71, pulse 80, temperature 97.9 F (36.6 C), temperature source Oral.There is no height or weight on file to calculate BMI.  General Appearance: Casual  Eye Contact:  Fair  Speech:  Clear and Coherent  Volume:  Normal  Mood:  Dysphoric  Affect:  Tearful  Thought Process:  Goal Directed and  Descriptions of Associations: Intact  Orientation:  Full (Time, Place, and Person)  Thought Content: Logical   Suicidal Thoughts:  No  Homicidal Thoughts:  No  Memory:  Immediate;   Fair Recent;   Fair Remote;   Fair  Judgement:  Fair  Insight:  Fair  Psychomotor Activity:  Normal  Concentration:  Concentration: Fair and Attention Span: Fair  Recall:  [employer] of Knowledge: Fair  Language: Fair  Akathisia:  No  Handed:  Right  AIMS (if indicated): na  Assets:  Communication Skills Desire for Improvement Social Support  ADL's:  Intact  Cognition: WNL  Sleep:  restless due to pain   Screenings: PHQ2-9     Clinical Support from 01/04/2018 in [facility] Clinical Support from 11/16/2017 in [place] Office Visit from 10/18/2017 in [facility] Procedure visit from 10/04/2017 in [place] Office Visit from 09/25/2017 in [place] [facility]  PHQ-2 Total Score  0  0  1  0  0       Assessment and Plan:  Stephanie [name] is a 85 year old Caucasian female, divorced, on SSD, has a history of bipolar disorder, anxiety disorder, insomnia, RLS, sleep apnea, multiple medical problems like history of stroke, hepatitis B, thrombocytopenia, COPD, ascites, hyperlipidemia, hypertension and aortic dissection, vitamin D deficiency, presented to the clinic today for a follow-up visit.  Patient today presented with staff at her assisted living facility.  Patient continues to struggle with some on and off crying spells and anxiety symptoms.  She also has restlessness with sleep due to her pain.  Discussed medication changes as noted below.  Plan Bipolar disorder Increase Lamictal to 125 mg p.o. twice [name] Continue Wellbutrin XL 150 mg p.o. [name].  GAD Continue Klonopin as prescribed. Discussed a referral for psychotherapy with new therapist in community.  Patient provided with a list of therapist.  For insomnia Pamelor 20 mg p.o. nightly  Follow-up in clinic in 6 weeks.  More than 50 % of the time was spent for psychoeducation and supportive psychotherapy and care coordination.  This note was generated in part or whole with voice recognition software. Voice recognition is usually quite accurate but there are transcription errors that can and very often do occur. I apologize for any typographical errors that were not detected and corrected.       Ursula Alert, MD 01/18/2018, 10:40 AM

## 2018-01-25 ENCOUNTER — Telehealth: Payer: Self-pay | Admitting: Hematology and Oncology

## 2018-01-25 ENCOUNTER — Other Ambulatory Visit: Payer: Self-pay | Admitting: Urgent Care

## 2018-01-25 DIAGNOSIS — D696 Thrombocytopenia, unspecified: Secondary | ICD-10-CM

## 2018-01-25 NOTE — Telephone Encounter (Signed)
   If bruising, let's check a CBC   M

## 2018-01-25 NOTE — Telephone Encounter (Signed)
Received VM from [facility] (Pt caretaker) that pt has some bruising and was instructed to call office to see what they needed to do. She would like a call back this evening to see what they need to do

## 2018-01-29 ENCOUNTER — Telehealth: Payer: Self-pay | Admitting: Pulmonary Disease

## 2018-01-29 NOTE — Telephone Encounter (Signed)
Called patient unable to reach left message to give us a call back.

## 2018-01-30 ENCOUNTER — Telehealth: Payer: Self-pay

## 2018-01-30 ENCOUNTER — Other Ambulatory Visit: Payer: Self-pay

## 2018-01-30 DIAGNOSIS — K7460 Unspecified cirrhosis of liver: Secondary | ICD-10-CM

## 2018-01-30 NOTE — Telephone Encounter (Signed)
-----   Message from Lucilla Lame, MD sent at 01/29/2018  5:33 PM EDT ----- With the patient know that her ultrasound did not show any masses or worrysome features Except for her cirrhosis.

## 2018-01-30 NOTE — Addendum Note (Signed)
Addended byGlennie Isle E on: 01/30/2018 12:19 PM   Modules accepted: Orders

## 2018-01-30 NOTE — Telephone Encounter (Signed)
Pt notified of [name] results. Also advised pt she still has pending labs that need to be completed for Dr. Allen Norris. She said she would go and have these done when she can.

## 2018-01-31 LAB — CBC WITH DIFFERENTIAL/PLATELET
BASOS: 0 %
Basophils Absolute: 0 10*3/uL (ref 0.0–0.2)
EOS (ABSOLUTE): 0.1 10*3/uL (ref 0.0–0.4)
Eos: 1 %
Hematocrit: 33.3 % — ABNORMAL LOW (ref 34.0–46.6)
Hemoglobin: 11.2 g/dL (ref 11.1–15.9)
IMMATURE GRANULOCYTES: 0 %
Immature Grans (Abs): 0 10*3/uL (ref 0.0–0.1)
Lymphocytes Absolute: 0.8 10*3/uL (ref 0.7–3.1)
Lymphs: 12 %
MCH: 31.8 pg (ref 26.6–33.0)
MCHC: 33.6 g/dL (ref 31.5–35.7)
MCV: 95 fL (ref 79–97)
MONOS ABS: 0.6 10*3/uL (ref 0.1–0.9)
Monocytes: 9 %
NEUTROS ABS: 4.9 10*3/uL (ref 1.4–7.0)
NEUTROS PCT: 78 %
PLATELETS: 102 10*3/uL — AB (ref 150–450)
RBC: 3.52 x10E6/uL — ABNORMAL LOW (ref 3.77–5.28)
RDW: 15.6 % — AB (ref 12.3–15.4)
WBC: 6.3 10*3/uL (ref 3.4–10.8)

## 2018-01-31 NOTE — Telephone Encounter (Signed)
Pt states her CPAP machine is broken and has not been seen since 07/2016. Pt needs OV; offered apt with Lazaro Arms, NP this afternoon or this week. Pt will need to contact her "driver" to ensure she can get here. When patient calls back please make OV with Tonya for new CPAP machine. Thanks.

## 2018-02-01 ENCOUNTER — Telehealth: Payer: Self-pay | Admitting: *Deleted

## 2018-02-01 LAB — AFP TUMOR MARKER: AFP, Serum, Tumor Marker: 3.9 ng/mL (ref 0.0–8.3)

## 2018-02-01 NOTE — Telephone Encounter (Signed)
Pt has a pending OV with Beth on 02/20/18 at 11am. Nothing further was needed.

## 2018-02-01 NOTE — Telephone Encounter (Signed)
-----   Message from Stephanie [name] sent at 02/01/2018  8:52 AM EDT ----- Pt got labs done yesterday by GI does she need to do anymore before 9/9 visit she goes to [facility]

## 2018-02-01 NOTE — Telephone Encounter (Signed)
Patient called stating GI drew labs on her yesterday.  Wants to know if she will need to have more drawn for Dr. Laqueta Linden patient back to inform her that in Dr. Lurline Del  last note she wanted patient to have CBC with diff, CMP, AFP and PT-INR.  Patient had RTC appointment on 02-12-18.  Patient had CBC with diff and AFP drawn yesterday.  She will still need CMP and PT-INR drawn.  Patient wrote these down and states she will have them done prior to her appointment on 02-12-18.

## 2018-02-03 IMAGING — MG MM DIGITAL SCREENING BILAT W/ TOMO W/ CAD
8 of 15 series · 8 of 31 positions shown · non-contrast
Comparison: Previous exam(s).

ACR Breast Density Category a: The breast tissue is almost entirely
fatty.

CLINICAL DATA: Screening.

EXAM:
2D DIGITAL SCREENING BILATERAL MAMMOGRAM WITH 3D TOMO WITH CAD

[L CV]
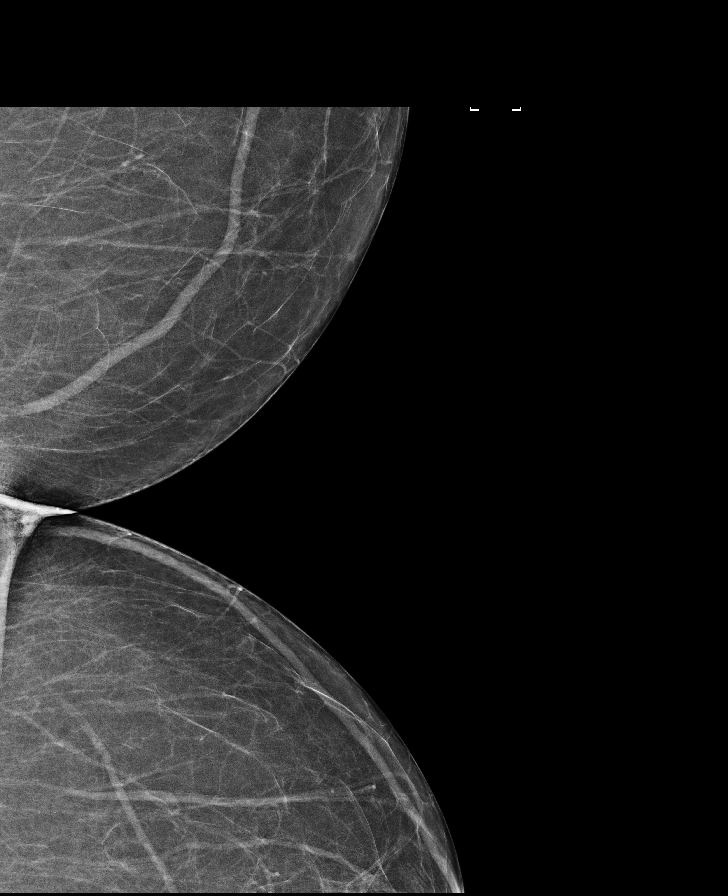

[R MLO]
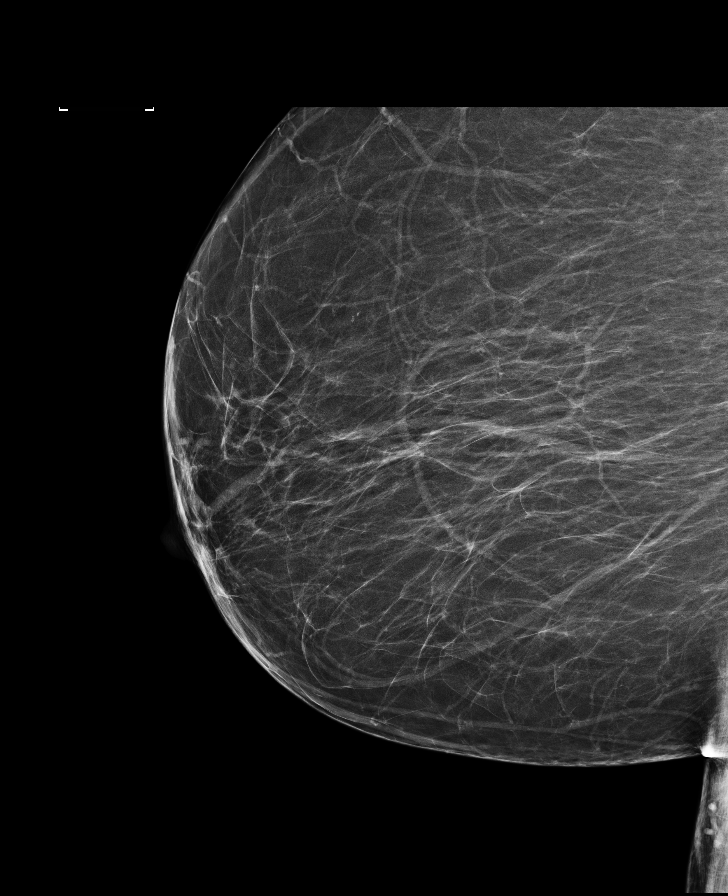

[L MLO]
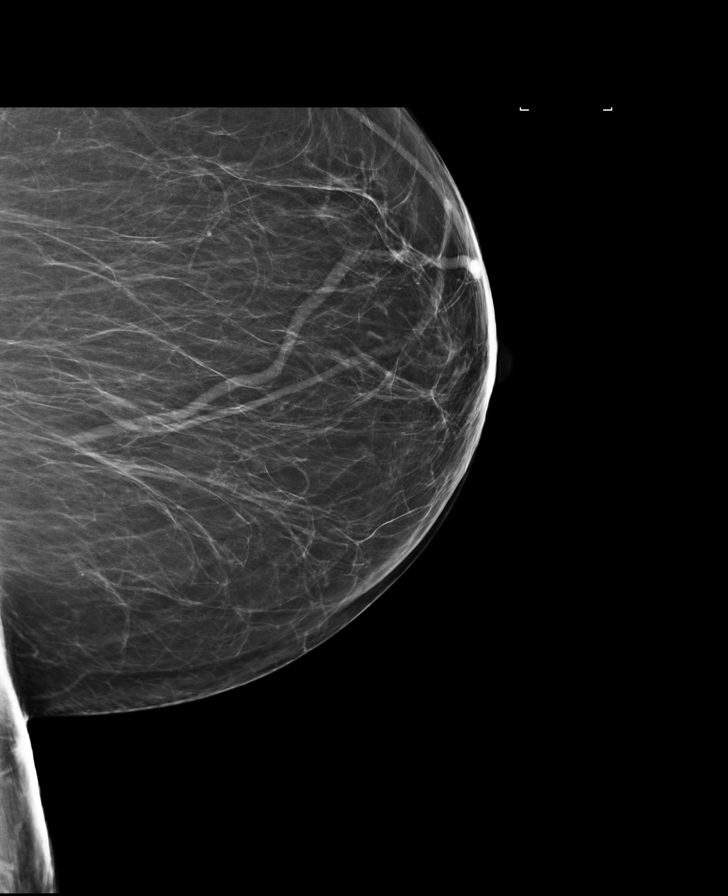

[R MLO synth-2D]
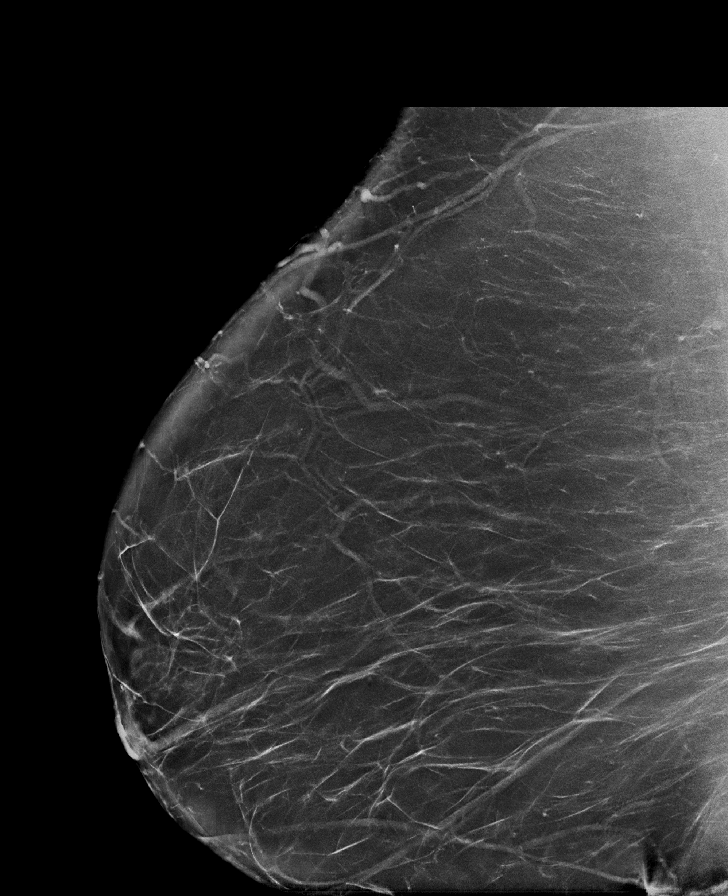

[R CC]
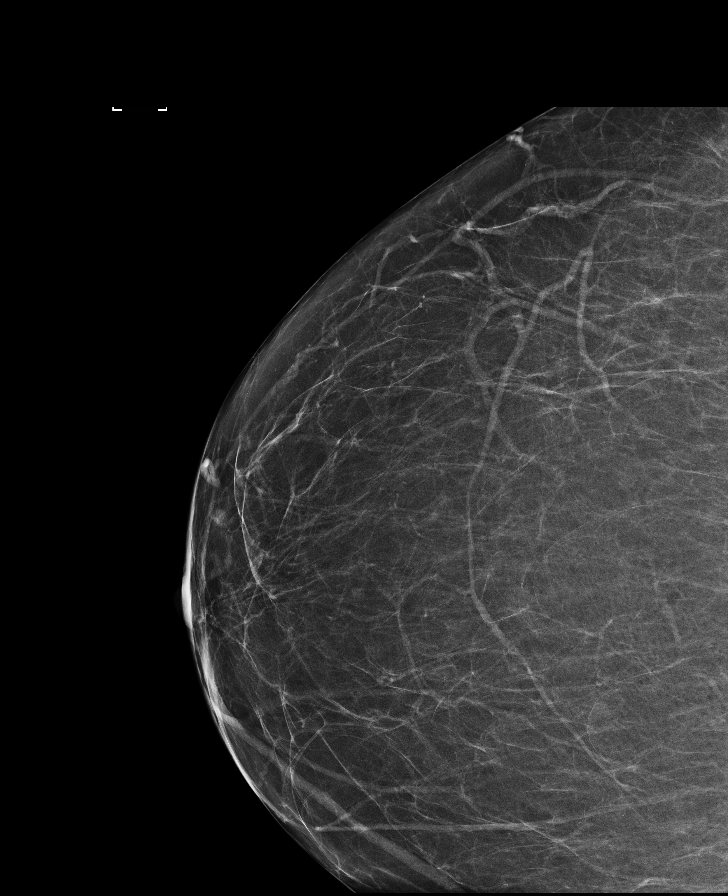

[L MLO synth-2D]
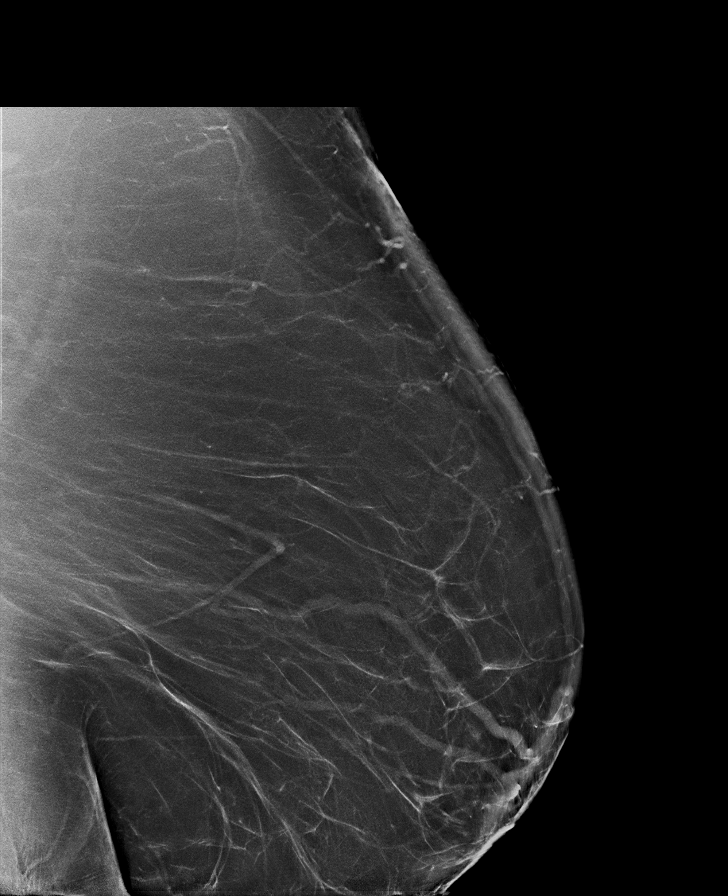

[R CC synth-2D]
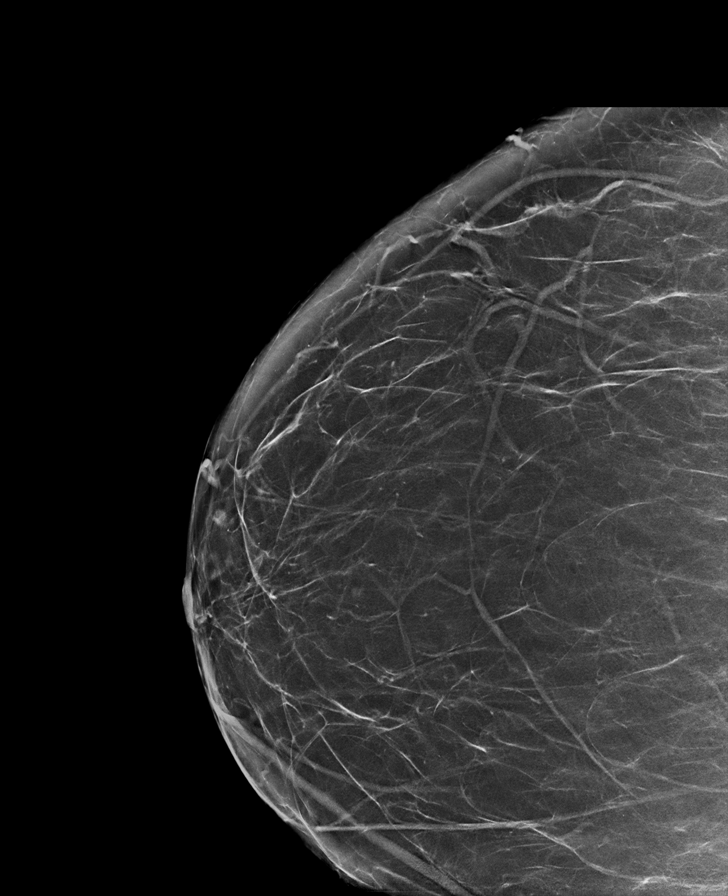

[L CC synth-2D]
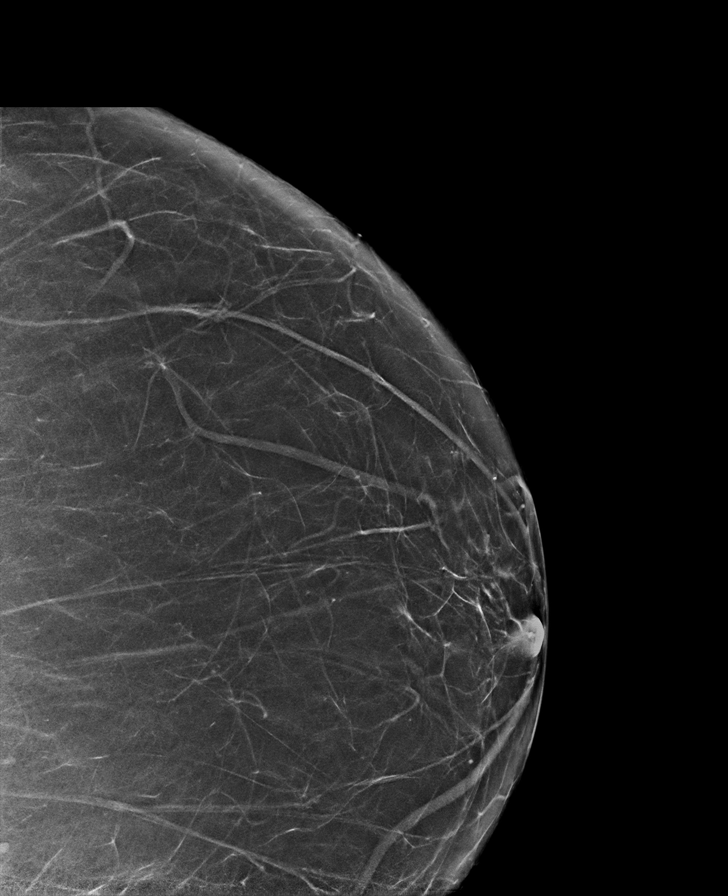

[8 of 31 positions shown; findings below may reference images not displayed]

FINDINGS: There are no findings suspicious for malignancy. Images were
processed with CAD.
IMPRESSION: No mammographic evidence of malignancy. A result letter of this
screening mammogram will be mailed directly to the patient.

RECOMMENDATION:
Screening mammogram in one year. (Code:25-W-ROJ)

BI-RADS CATEGORY  1: Negative.

## 2018-02-04 IMAGING — CR DG CHEST 2V
1 series · 2 of 2 positions shown · non-contrast
Comparison: 03/07/2017

CLINICAL DATA: Chest pain off and on all day, shortness of breath,
neck stiffness and question swelling

EXAM:
CHEST  2 VIEW

[Series 1: dg chest 2 view · 0.14mm/px · 2 of 2 slices shown]
[im 1/2]
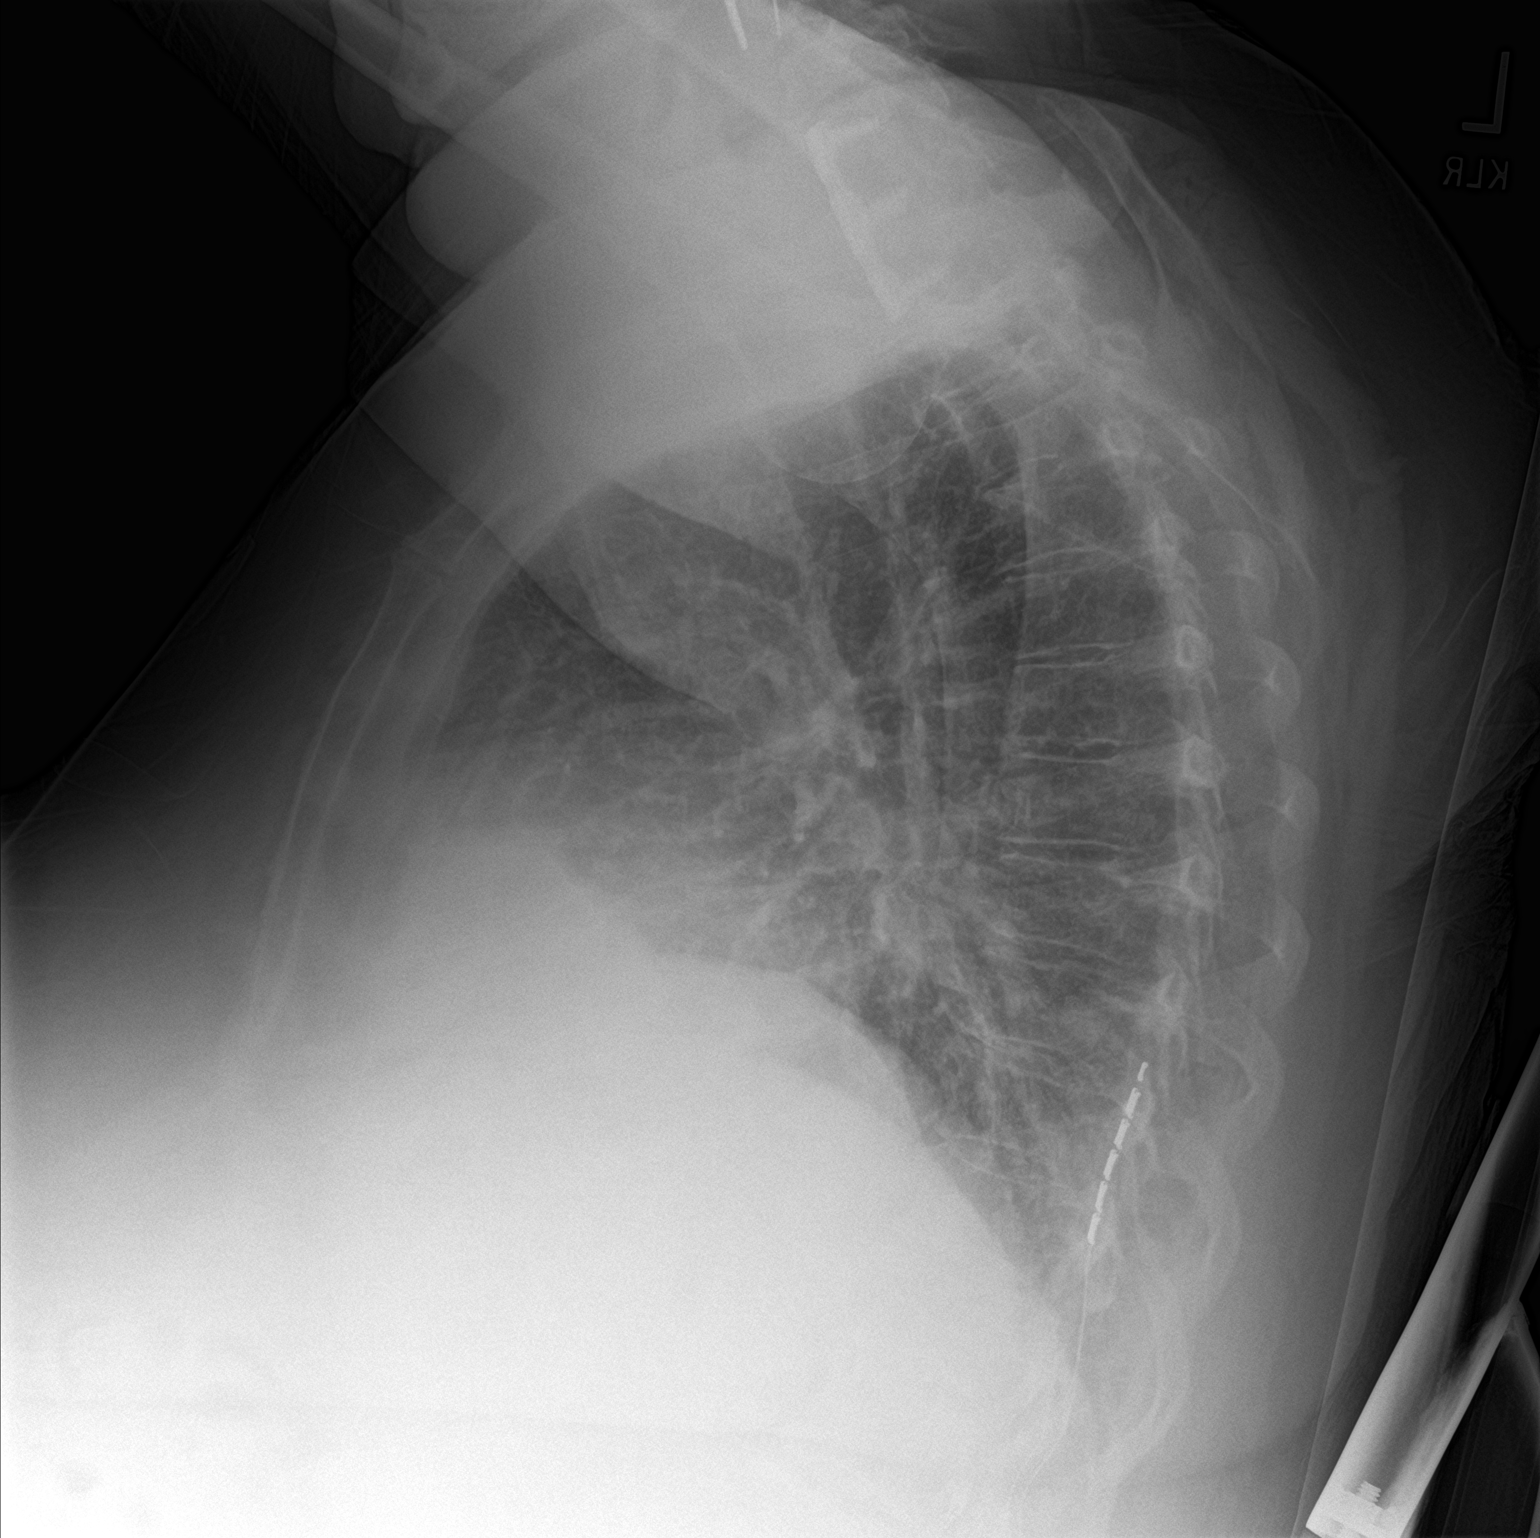
[im 2/2]
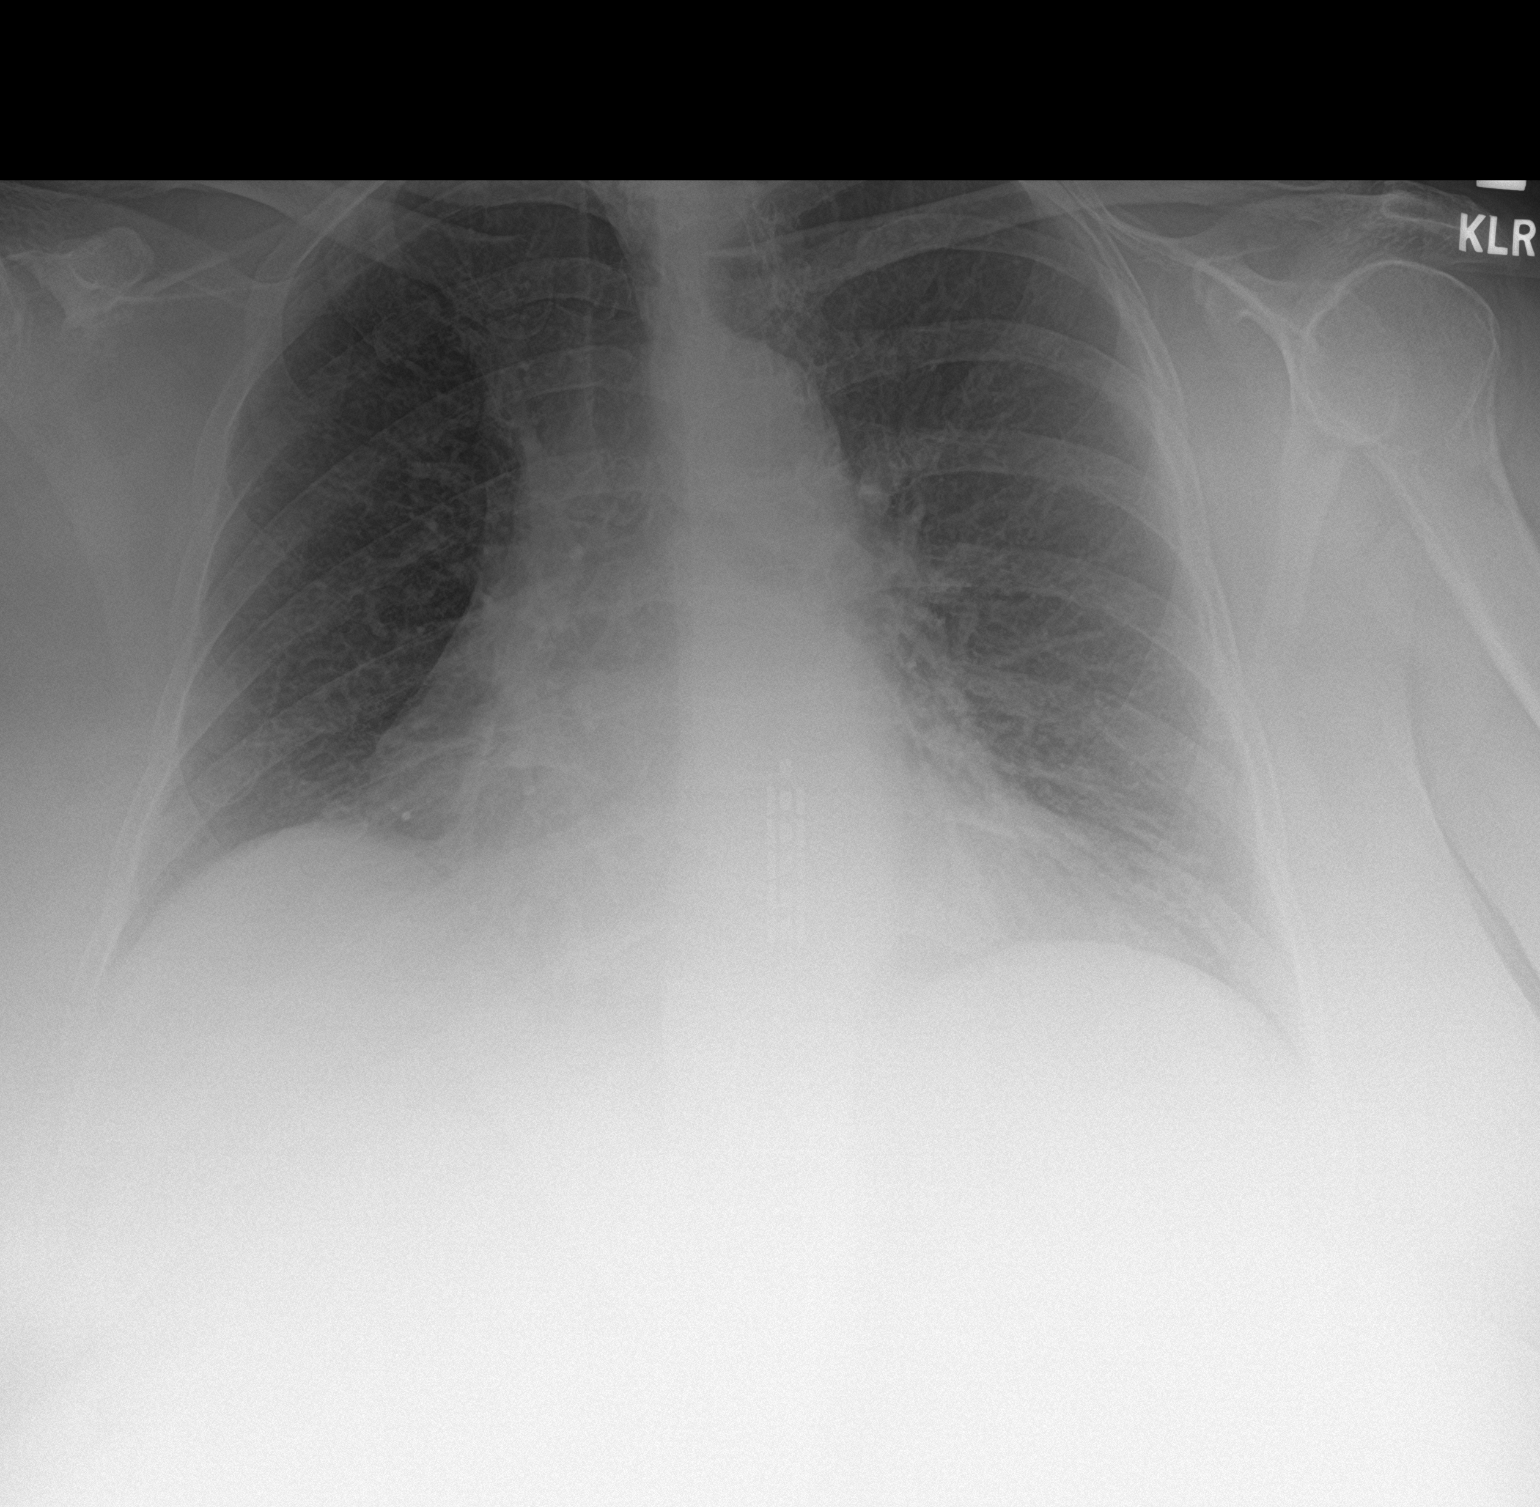

[2 of 2 positions shown; findings below may reference images not displayed]

FINDINGS: Enlargement of cardiac silhouette.

Rotated to the RIGHT.

Mediastinal contours and pulmonary vascularity normal.

Lungs clear.

No pleural effusion or pneumothorax.

Prior cervical spine fusion and intraspinal stimulator again noted.
IMPRESSION: Enlargement of cardiac silhouette without acute infiltrate.

## 2018-02-07 ENCOUNTER — Encounter: Payer: Self-pay | Admitting: Hematology and Oncology

## 2018-02-08 ENCOUNTER — Telehealth: Payer: Self-pay

## 2018-02-08 ENCOUNTER — Ambulatory Visit: Payer: Medicaid Other | Admitting: Licensed Clinical Social Worker

## 2018-02-08 NOTE — Telephone Encounter (Signed)
-----   Message from Lucilla Lame, MD sent at 02/01/2018 10:23 AM EDT ----- Let the patient know that her tumor marker is normal. Repeat in 6 months.

## 2018-02-08 NOTE — Telephone Encounter (Signed)
Pt notified of lab results

## 2018-02-12 ENCOUNTER — Encounter: Payer: Self-pay | Admitting: Hematology and Oncology

## 2018-02-12 ENCOUNTER — Inpatient Hospital Stay: Payer: Medicare Other | Attending: Hematology and Oncology | Admitting: Hematology and Oncology

## 2018-02-12 VITALS — BP 149/97 | HR 62 | Temp 96.2°F | Resp 18 | Wt 208.3 lb

## 2018-02-12 DIAGNOSIS — Z86010 Personal history of colonic polyps: Secondary | ICD-10-CM | POA: Insufficient documentation

## 2018-02-12 DIAGNOSIS — Z8673 Personal history of transient ischemic attack (TIA), and cerebral infarction without residual deficits: Secondary | ICD-10-CM | POA: Insufficient documentation

## 2018-02-12 DIAGNOSIS — R161 Splenomegaly, not elsewhere classified: Secondary | ICD-10-CM | POA: Diagnosis not present

## 2018-02-12 DIAGNOSIS — G2581 Restless legs syndrome: Secondary | ICD-10-CM | POA: Insufficient documentation

## 2018-02-12 DIAGNOSIS — Z803 Family history of malignant neoplasm of breast: Secondary | ICD-10-CM | POA: Diagnosis not present

## 2018-02-12 DIAGNOSIS — G4733 Obstructive sleep apnea (adult) (pediatric): Secondary | ICD-10-CM | POA: Insufficient documentation

## 2018-02-12 DIAGNOSIS — D509 Iron deficiency anemia, unspecified: Secondary | ICD-10-CM | POA: Insufficient documentation

## 2018-02-12 DIAGNOSIS — J45909 Unspecified asthma, uncomplicated: Secondary | ICD-10-CM | POA: Insufficient documentation

## 2018-02-12 DIAGNOSIS — I7100 Dissection of unspecified site of aorta: Secondary | ICD-10-CM | POA: Diagnosis not present

## 2018-02-12 DIAGNOSIS — K219 Gastro-esophageal reflux disease without esophagitis: Secondary | ICD-10-CM | POA: Insufficient documentation

## 2018-02-12 DIAGNOSIS — E119 Type 2 diabetes mellitus without complications: Secondary | ICD-10-CM

## 2018-02-12 DIAGNOSIS — F418 Other specified anxiety disorders: Secondary | ICD-10-CM | POA: Diagnosis not present

## 2018-02-12 DIAGNOSIS — R600 Localized edema: Secondary | ICD-10-CM | POA: Insufficient documentation

## 2018-02-12 DIAGNOSIS — Z79899 Other long term (current) drug therapy: Secondary | ICD-10-CM | POA: Diagnosis not present

## 2018-02-12 DIAGNOSIS — M129 Arthropathy, unspecified: Secondary | ICD-10-CM | POA: Diagnosis not present

## 2018-02-12 DIAGNOSIS — E785 Hyperlipidemia, unspecified: Secondary | ICD-10-CM | POA: Insufficient documentation

## 2018-02-12 DIAGNOSIS — G4700 Insomnia, unspecified: Secondary | ICD-10-CM | POA: Diagnosis not present

## 2018-02-12 DIAGNOSIS — Z87891 Personal history of nicotine dependence: Secondary | ICD-10-CM | POA: Diagnosis not present

## 2018-02-12 DIAGNOSIS — K7460 Unspecified cirrhosis of liver: Secondary | ICD-10-CM | POA: Diagnosis not present

## 2018-02-12 DIAGNOSIS — Z794 Long term (current) use of insulin: Secondary | ICD-10-CM | POA: Diagnosis not present

## 2018-02-12 DIAGNOSIS — M25561 Pain in right knee: Secondary | ICD-10-CM | POA: Diagnosis not present

## 2018-02-12 DIAGNOSIS — R634 Abnormal weight loss: Secondary | ICD-10-CM

## 2018-02-12 DIAGNOSIS — D696 Thrombocytopenia, unspecified: Secondary | ICD-10-CM | POA: Diagnosis not present

## 2018-02-12 DIAGNOSIS — Z801 Family history of malignant neoplasm of trachea, bronchus and lung: Secondary | ICD-10-CM | POA: Insufficient documentation

## 2018-02-12 DIAGNOSIS — F319 Bipolar disorder, unspecified: Secondary | ICD-10-CM | POA: Diagnosis not present

## 2018-02-12 DIAGNOSIS — Z800 Family history of malignant neoplasm of digestive organs: Secondary | ICD-10-CM

## 2018-02-12 DIAGNOSIS — K7581 Nonalcoholic steatohepatitis (NASH): Secondary | ICD-10-CM

## 2018-02-12 NOTE — Progress Notes (Signed)
Patient offers no complaints today. 

## 2018-02-12 NOTE — Progress Notes (Signed)
[facility] day:  02/12/2018   Chief Complaint: Stephanie [name] is a 67 y.o. female with chronic thrombocytopenia and iron deficiency anemia who is seen for 3 month assessment.  HPI: The patient was last seen in the medical oncology clinic on  11/07/2017.  At that time, she was doing well overall. She had continued pain in her RIGHT knee.  She noted chronic bruising.  Exam revealed no petechiae.   Abdominal ultrasound on 01/08/2018 revealed stable changes of cirrhosis.  There was no acute abnormality.  [facility] on 01/31/2018 revealed a hematocrit of 33.3, hemoglobin 11.2, MCV 95, platelets 102,000, WBC 6300 with an ANC of 4900.  [facility] labs on 02/06/2018 revealed a sodium 138, potassium 3.4, creatinine 1.25, alkaline phosphatase 142 (39-117), ALT 21, ALT 14, PT 10.9 (INR 1.0).  During the interim, she has felt "pretty good".  She is eating "ok".  She is on oral iron BID.  She denies any nausea, vomiting or diarrhea.  She is on lactulose 1x/day.  She has knee pain.  She sees orthopedics tomorrow. She is able to perform her ADLs.   Past Medical History:  Diagnosis Date  . Adenomatous colon polyp 08/27/2014   Polyps x 3  . Anemia   . Anxiety   . 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 Arthritis   . Asthma   . Bipolar affective disorder ([place])    h/o  . Blood transfusion 2000  . Blood transfusion without reported diagnosis   . Cancer ([place])   . Chronic abdominal pain    abdominal wall pain  . 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 Colon polyp   . Depression   . Diabetes mellitus without complication ([place])   . Fatty liver 04/09/08   found in abd CT  . GERD (gastroesophageal reflux disease)   . H/O: CVA (cardiovascular accident)    TIA  . H/O: rheumatic fever   . Hepatitis    HEP "B" twenty years ago  . Hyperlipidemia   . Incontinence   . Insomnia   . Lower extremity edema   . Obstructive sleep apnea    Use C-PAP twice per week  .  Platelets decreased ([place])   . RLS (restless legs syndrome)   . Shortness of breath   . Sleep apnea   . Stroke [facility])    TIA- 2002  . Ulcer   . Unspecified disorders of nervous system     Past Surgical History:  Procedure Laterality Date  . ABDOMINAL EXPLORATION SURGERY    . ABDOMINAL HYSTERECTOMY     total  . Axillary artery cannulation via 8-mm Hemashield graft, median sternotomy, extracorporeal circulation with deep hypothermic circulatory arrest, repair of  aortic dessection  01/23/2009   Dr Arlyce Dice  . BACK SURGERY      x 5  . cataract     both eyes  . CHOLECYSTECTOMY    . COLONOSCOPY    . EYE SURGERY Bilateral    Cataract Extraction with IOL  . HEMORROIDECTOMY     and colon polyp removed  . KNEE ARTHROSCOPY WITH LATERAL RELEASE Right 11/15/2016   Procedure: KNEE ARTHROSCOPY WITH LATERAL RELEASE;  Surgeon: Corky Mull, MD;  Location: [facility] OR[facility];  Service: Orthopedics;  Laterality: Right;  . LIVER BIOPSY  04/10/2012   Procedure: LIVER BIOPSY;  Surgeon: Inda Castle, MD;  Location: [facility];  Service: Endoscopy;  Laterality: N/A;  ultrasound to  mark order for abd limited/liver to be marked to be sent by [email or web address]   . LUMBAR FUSION  10/09  . LUMBAR WOUND DEBRIDEMENT  05/09/2011   Procedure: LUMBAR WOUND DEBRIDEMENT;  Surgeon: Dahlia Bailiff;  Location: [facility];  Service: Orthopedics;  Laterality: N/A;  IRRIGATION AND DEBRIDEMENT SPINAL WOUND  . POSTERIOR CERVICAL FUSION/FORAMINOTOMY    . ROTATOR CUFF REPAIR     left    Family History  Problem Relation Age of Onset  . Breast cancer Mother   . Lung cancer Mother   . Stomach cancer Father   . Diabetes Unknown        4 aunts, and 1 uncle  . Depression Brother   . Anxiety disorder Brother   . Colon cancer Neg Hx   . Esophageal cancer Neg Hx   . Rectal cancer Neg Hx     Social History:  reports that she quit smoking about 4 years ago. Her smoking use included cigarettes. She has a 75.00 pack-year smoking history.  She has never used smokeless tobacco. She reports that she does not drink alcohol or use drugs.  She is a resident at the [facility].  She is accompanied by Hebert Soho, the transportation specialist from [employer], today.  Allergies:  Allergies  Allergen Reactions  . Doxycycline Hives, Swelling, Other (See Comments) and Nausea Only    Reaction:  Facial swelling  Face red and swollen; no difficulty breathing    Current Medications: Current Outpatient Medications  Medication Sig Dispense Refill  . acetaminophen (TYLENOL) 325 MG tablet Take 650 mg by mouth every 6 (six) hours as needed for mild pain.    Marland Kitchen albuterol (PROVENTIL HFA;VENTOLIN HFA) 108 (90 Base) MCG/ACT inhaler Inhale 2 puffs into the lungs every 6 (six) hours as needed for wheezing or shortness of breath.    Marland Kitchen albuterol (PROVENTIL) (2.5 MG/3ML) 0.083% nebulizer solution Take 2.5 mg by nebulization every 6 (six) hours as needed for wheezing or shortness of breath.    . budesonide-formoterol (SYMBICORT) 160-4.5 MCG/ACT inhaler Inhale 2 puffs into the lungs 2 (two) times daily.     Marland Kitchen buPROPion (WELLBUTRIN XL) 150 MG 24 hr tablet Take 1 tablet (150 mg total) by mouth daily. 90 tablet 1  . clonazePAM (KLONOPIN) 0.5 MG tablet Take 0.5 mg by mouth 3 (three) times daily.     Marland Kitchen dicyclomine (BENTYL) 10 MG capsule Take 10 mg by mouth 3 (three) times daily.     . ferrous sulfate 325 (65 FE) MG tablet Take 325 mg by mouth 2 (two) times daily.     . furosemide (LASIX) 80 MG tablet Take 80 mg by mouth.    . gabapentin (NEURONTIN) 600 MG tablet Take 1 tablet (600 mg total) by mouth 3 (three) times daily. 90 tablet 2  . hydrochlorothiazide (MICROZIDE) 12.5 MG capsule Take 12.5 mg by mouth daily with supper.    . insulin glargine (LANTUS) 100 UNIT/ML injection Inject 50 Units into the skin every evening.     . insulin lispro (HUMALOG) 100 UNIT/ML injection Inject 8 Units into the skin 3 (three) times daily before meals.     . lactulose (CHRONULAC) 10  GM/15ML solution Take 10 g by mouth daily. (0800)    . lamoTRIgine (LAMICTAL) 100 MG tablet Take 1 tablet (100 mg total) by mouth 2 (two) times daily. (0800 & 2000) 180 tablet 1  . lamoTRIgine (LAMICTAL) 25 MG tablet Take 1 tablet (25 mg total) by mouth 2 (two) times daily.  To be added to the Lamictal 100 mg at 8 AM and 8 pm 180 tablet 1  . lidocaine (LIDODERM) 5 % Place 2 patches onto the skin daily. Apply 1 patch to 2 areas on back (2 patches) every morning and remove at bedtime.    . liraglutide (VICTOZA) 18 MG/3ML SOPN Inject 1.8 mg into the skin daily.    Marland Kitchen LYRICA 150 MG capsule Take 150 mg by mouth 3 (three) times daily. (0800, 1400 & 2000)    . metFORMIN (GLUCOPHAGE-XR) 500 MG 24 hr tablet Take 500 mg by mouth twice daily.    . metoCLOPramide (REGLAN) 5 MG tablet Take 5 mg by mouth 4 (four) times daily -  before meals and at bedtime. (0800, 1200, 1600 & 2000)    . MYRBETRIQ 25 MG TB24 tablet     . nortriptyline (PAMELOR) 10 MG capsule Take 2 capsules (20 mg total) by mouth at bedtime. For sleep, pain, anxiety 180 capsule 1  . nystatin (MYCOSTATIN/NYSTOP) powder Apply topically 4 (four) times daily. (Patient taking differently: Apply topically 4 (four) times daily. Apply to inner thigh/ peri area twice daily and apply to abdomen 4 times daily.) 15 g 0  . omeprazole (PRILOSEC) 20 MG capsule Take 20 mg by mouth daily.     . ondansetron (ZOFRAN) 4 MG tablet Take 4 mg by mouth every 4 (four) hours as needed for nausea or vomiting.    Marland Kitchen oxybutynin (DITROPAN-XL) 5 MG 24 hr tablet Take 5 mg by mouth daily. (0800)    . RESTASIS 0.05 % ophthalmic emulsion     . rOPINIRole (REQUIP) 3 MG tablet Take 3 mg by mouth at bedtime. (2000)    . spironolactone (ALDACTONE) 50 MG tablet Take 50 mg by mouth 2 (two) times daily. (0800)    . vitamin C (ASCORBIC ACID) 500 MG tablet Take 500 mg by mouth 2 (two) times daily. (0800 & 2000)    . potassium chloride (K-DUR,KLOR-CON) 10 MEQ tablet Take 1 tablet (10 mEq  total) by mouth daily for 4 days. 4 tablet 0  . zolpidem (AMBIEN) 5 MG tablet Take 5 mg by mouth daily.     No current facility-administered medications for this visit.     Review of Systems  Constitutional: Positive for malaise/fatigue and weight loss (up 21 pounds). Negative for diaphoresis and fever.       Feels "pretty good".  HENT: Negative for congestion, ear pain, nosebleeds, sinus pain and sore throat.   Eyes: Negative.  Negative for blurred vision, double vision, photophobia, pain, discharge and redness.  Respiratory: Positive for shortness of breath (exertional). Negative for cough, hemoptysis and sputum production.   Cardiovascular: Negative.  Negative for palpitations, orthopnea, leg swelling and PND.  Gastrointestinal: Negative for abdominal pain, blood in stool, constipation, diarrhea, melena, nausea and vomiting.  Genitourinary: Negative for dysuria, frequency, hematuria and urgency.  Musculoskeletal: Positive for joint pain. Negative for back pain, falls and myalgias.  Skin: Negative.  Negative for itching and rash.  Neurological: Negative for dizziness, tremors, sensory change, speech change, weakness and headaches.  Endo/Heme/Allergies: Bruises/bleeds easily.       Diabetes  Psychiatric/Behavioral: Negative for depression and memory loss. The patient is not nervous/anxious and does not have insomnia.   All other systems reviewed and are negative.  Performance status (ECOG): 2  Physical Exam: Blood pressure (!) 149/97, pulse 62, temperature (!) 96.2 F (35.7 C), temperature source Tympanic, resp. rate 18, weight 208 lb 5 oz (94.5 kg). GENERAL:  Well developed, well nourished, woman sitting comfortably in a wheelchair in the exam room in no acute distress. MENTAL STATUS:  Alert and oriented to person, place and time. HEAD:  Short gray hair.  Normocephalic, atraumatic, face symmetric, no Cushingoid features. EYES:  Blue eyes.  Pupils equal round and reactive to light and  accomodation.  No conjunctivitis or scleral icterus. ENT:  Oropharynx clear without lesion.  Tongue normal. Mucous membranes moist.  RESPIRATORY:  Clear to auscultation without rales, wheezes or rhonchi. CARDIOVASCULAR:  Regular rate and rhythm without murmur, rub or gallop. ABDOMEN:  Soft, slightly tender without guarding or rebound tenderness.  Active bowel sounds and no hepatosplenomegaly.  No masses. SKIN:  No rashes, ulcers or lesions. EXTREMITIES: No edema, no skin discoloration or tenderness.  No palpable cords. LYMPH NODES: No palpable cervical, supraclavicular, axillary or inguinal adenopathy  NEUROLOGICAL: Unremarkable. PSYCH:  Appropriate.    [facility] Labs: 09/19/2016:  Hematocrit 39.0, hemoglobin 12.6, MCV 94, platelets are 110,000, white count 9000 with an ANC of 7200. Comprehensive metabolic panel included an alkaline phosphatase of 213. Bilirubin, SGOT and SGPT were normal.  Creatinine was normal. Iron studies included a ferritin of 136, iron saturation of 28% and a TIBC of 310. 04/05/2017:  Hematocrit 39.7, hemoglobin 13.3, MCV 96, platelets are 107,000, white count 5700 with an ANC of 4200. Comprehensive metabolic panel included an alkaline phosphatase of 225. Bilirubin, SGOT and SGPT were normal.  Creatinine was 0.78.  Ferritin was  214. PT was 10.9 with an INR of 1.1. 05/19/2017:  Hematocrit of 37.5, hemoglobin 12.4, MCV 96, platelets 117,000, WBC 5500 with an ANC of 3900.  LFTS revealed an albumen of 3.5, bilirubin 0.6, alkaline phosphatase of 219 (39-117), SGOT 25, SPGT 20.  PT was 10.8 (INR 1.0).  AFP was 4.6.  Ferritin was 329. 07/28/2017:  Alkaline phosphatase 176 (improved). 10/26/2017: Hematocrit 38.0, hemoglobin 13.2, MCV 93, platelets 115,000, white count 6400 with an ANC of 4900.  LFTs revealed a albumin of 4.0, SGOT 27, SGPT 20, alkaline phosphatase 205 and bilirubin 0.6.  PT is 10.7 (INR 1.0).  AFP is 5.1. 02/06/2018:  Sodium 138, potassium 3.4, creatinine 1.25,  alkaline phosphatase 142 (39-117), ALT 21, ALT 14, PT 10.9 (INR 1.0).   No visits with results within 3 Day(s) from this visit.  Latest known visit with results is:  Orders Only on 01/30/2018  Component Date Value Ref Range Status  . AFP, Serum, Tumor Marker 01/31/2018 3.9  0.0 - 8.3 ng/mL Final   Comment: Roche [employer] Electrochemiluminescence Immunoassay (ECLIA) Values obtained with different assay methods or kits cannot be used interchangeably.  Results cannot be interpreted as absolute evidence of the presence or absence of malignant disease. This test is not interpretable in pregnant females.   . WBC 01/31/2018 6.3  3.4 - 10.8 x10E3/uL Final  . RBC 01/31/2018 3.52* 3.77 - 5.28 x10E6/uL Final  . Hemoglobin 01/31/2018 11.2  11.1 - 15.9 g/dL Final  . Hematocrit 01/31/2018 33.3* 34.0 - 46.6 % Final  . MCV 01/31/2018 95  79 - 97 fL Final  . MCH 01/31/2018 31.8  26.6 - 33.0 pg Final  . MCHC 01/31/2018 33.6  31.5 - 35.7 g/dL Final  . RDW 01/31/2018 15.6* 12.3 - 15.4 % Final  . Platelets 01/31/2018 102* 150 - 450 x10E3/uL Final  . Neutrophils 01/31/2018 78  Not Estab. % Final  . Lymphs 01/31/2018 12  Not Estab. % Final  . Monocytes 01/31/2018 9  Not Estab. % Final  .  Eos 01/31/2018 1  Not Estab. % Final  . Basos 01/31/2018 0  Not Estab. % Final  . Neutrophils Absolute 01/31/2018 4.9  1.4 - 7.0 x10E3/uL Final  . Lymphocytes Absolute 01/31/2018 0.8  0.7 - 3.1 x10E3/uL Final  . Monocytes Absolute 01/31/2018 0.6  0.1 - 0.9 x10E3/uL Final  . EOS (ABSOLUTE) 01/31/2018 0.1  0.0 - 0.4 x10E3/uL Final  . Basophils Absolute 01/31/2018 0.0  0.0 - 0.2 x10E3/uL Final  . Immature Granulocytes 01/31/2018 0  Not Estab. % Final  . Immature Grans (Abs) 01/31/2018 0.0  0.0 - 0.1 x10E3/uL Final    Assessment:  PAMALEE MARCOE is a 67 y.o. 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 Platelets have ranged between 108,000 and 128,000 without trend. She denies any new medications  or herbal products.  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  Work-up on 08/29/2014 revealed a positive rheumatoid factor and ANA (anti-double-stranded DNA of 5- equivocal).  Negative studies included hepatitis B surface antibody and antigen, hepatitis C testing, and HIV testing. B12, SPEP, and free light chains were normal.  Additional testing on 10/31/2014 revealed negative hepatitis B core antibody and CMV IgM.  Abdominal ultrasound on 06/27/2014 revealed mild splenomegaly (14.2 cm) with moderate cirrhosis and no focal liver lesions. RUQ ultrasound on 09/29/2016 revealed a nodular liver (cirrhotic) without focal mass.   Abdominal ultrasound on 05/24/2017 revealed hepatic cirrhosis with mild splenomegaly (15.5 cm).  Abdominal ultrasound on 01/08/2018 revealed stable changes of cirrhosis.  She is on lactulose 3/day.  Chest, abdomen, and pelvic CT on 08/15/2017 revealed no active pulmonary disease, morphologic appearance of cirrhosis with stable splenomegaly. There were small varices in left upper quadrant.  She has a neural stimulator device.  AFP has been followed: 4 on 04/21/2015, 4.6 on 05/19/2017, and 5.1 on 10/26/2017.  She denies any alcohol use.  She has a history of recurrent iron deficiency anemia.  Ferritin was 20 on 05/11/2015 and 15 on 11/13/2015. Hematocrit was 29.7 on 11/13/2015.  Last colonoscopy was in 08/2014.  Guaiac cards were negative x 3 in 11/2015.  Diet is good.  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  She is s/p arthroscopic right knee surgery on 11/15/2016.  She is s/p left femur fracture on 03/07/2017.  Alkaline phosphatase is elevated.  She was admitted to [facility] from 07/19/2017 - 07/22/2017 with a group B strept UTI and altered mental status.  Alkaline phosphatase was 143 on 07/19/2017.  Platelet count was 108,000 - 124,000.  She was treated with Augmentin.    Symptomatically, she notes  knee pain.  She has lost weight.  Exam is stable.  Plan: 1.  Review interval [facility] labs: 2.  Chronic thrombocytopenia:  Platelet count 102,000 (stable).  Etiology secondary to cirrhosis and splenomegaly. 3.  Cirrhosis:   Follow-up with GI as scheduled.  Continue surveillance every 6 months (ultrasound and AFP).  Schedule abdominal ultrasound on 07/11/2017. 4.  Complete paperwork for [facility]. 5.  RTC in 3 months for MD assessment and review of [facility] labs (CBC with diff, CMP, AFP, PT/INR).   Nolon Stalls, MD 02/12/2018,11:42 AM

## 2018-02-13 IMAGING — US US EXTREM LOW VENOUS*R*
1 series · 13 of 24 positions shown · non-contrast
Comparison: None.

CLINICAL DATA: Right leg swelling for 4 months.



[Series 1: us extrem low venous*right* · 0.07mm/px · 13 of 34 slices shown]
[im 1/34]
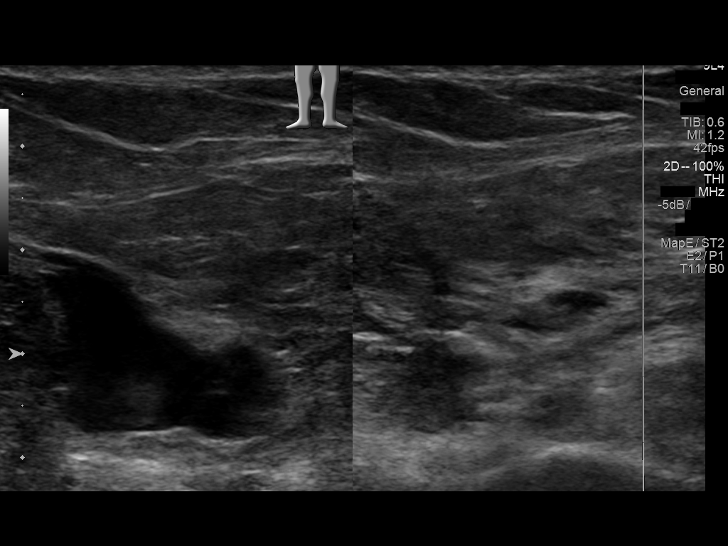
[im 3/34]
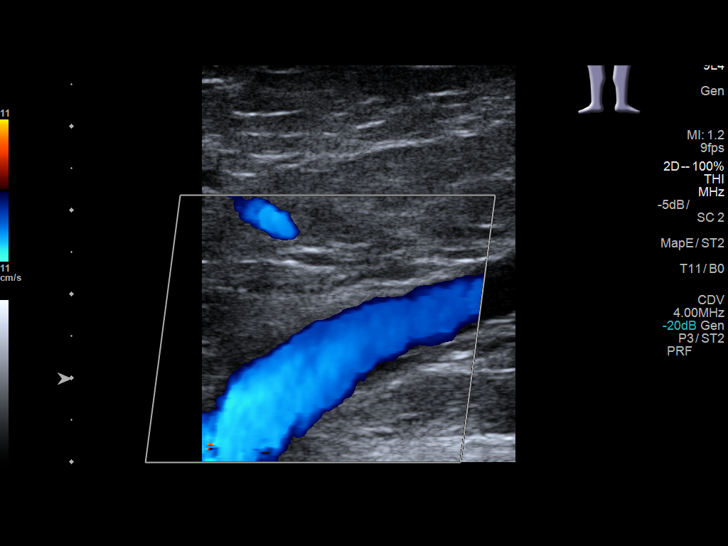
[im 6/34]
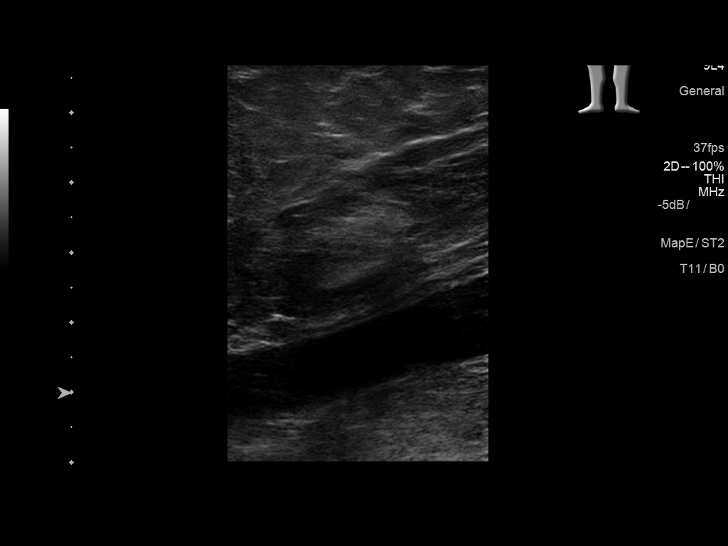
[im 9/34]
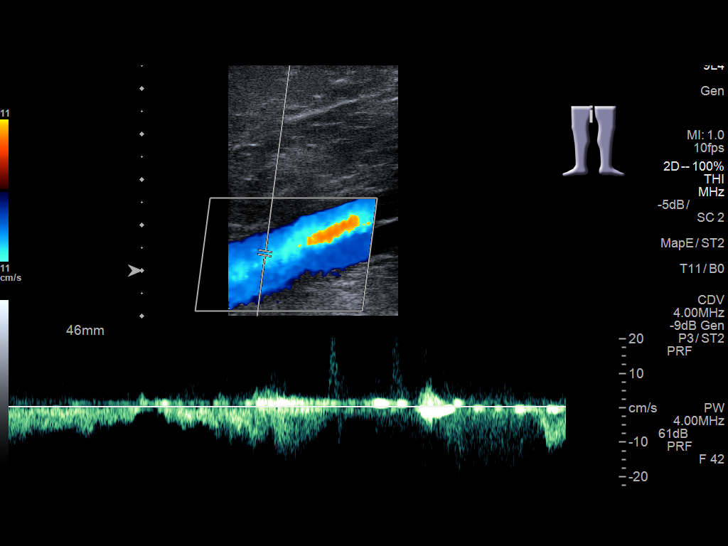
[im 12/34]
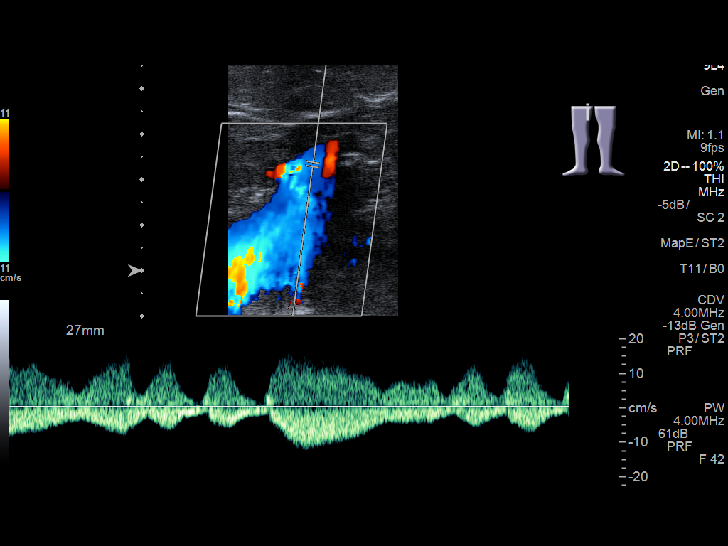
[im 15/34]
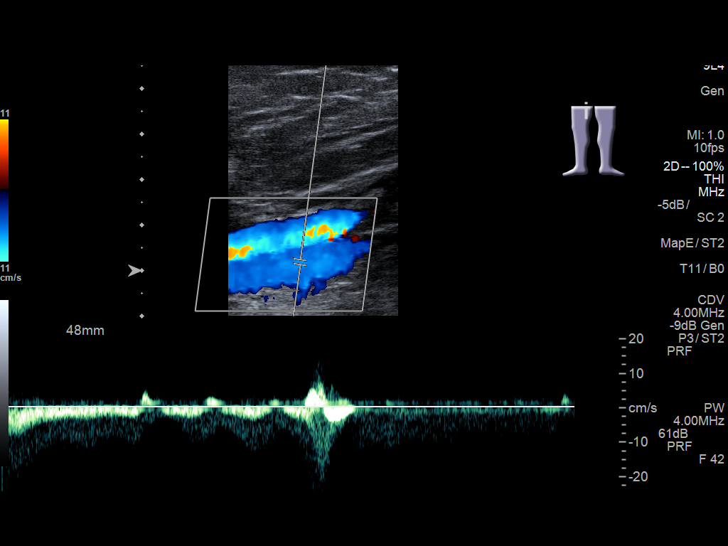
[im 18/34]
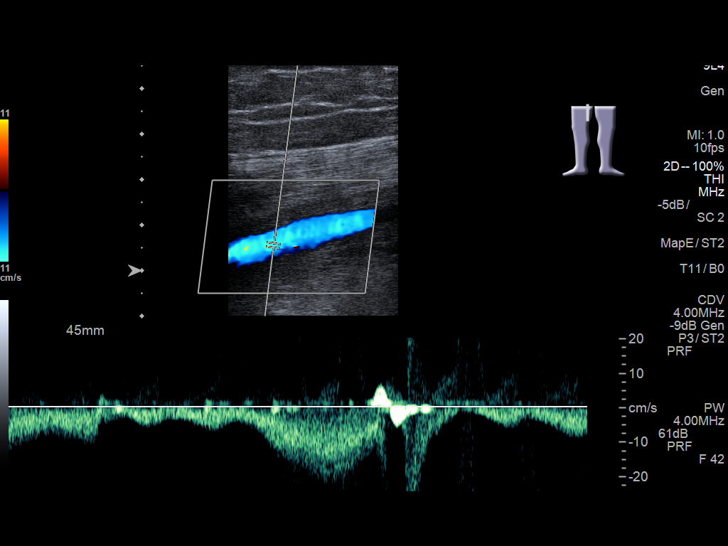
[im 19/34]
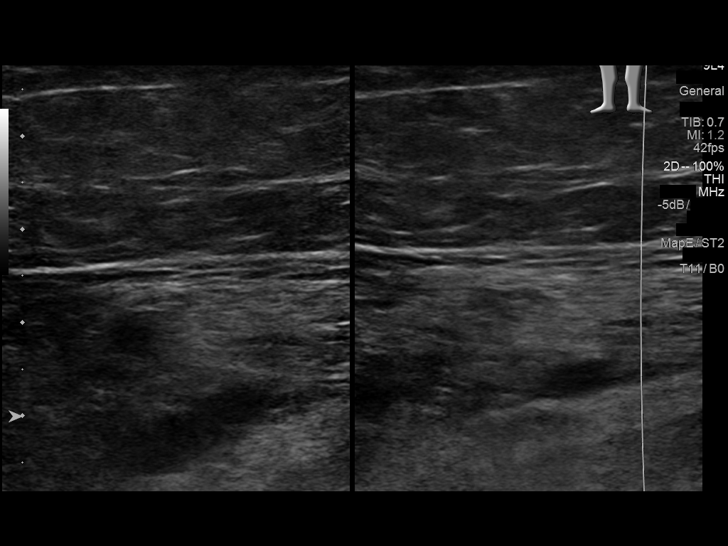
[im 22/34]
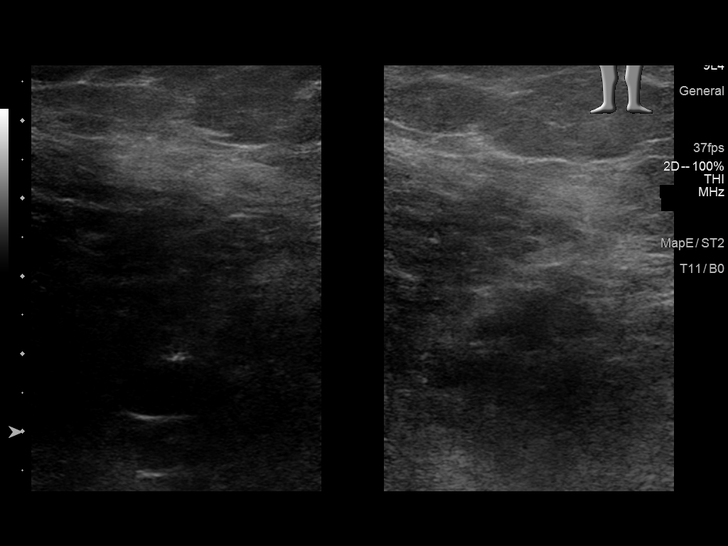
[im 25/34]
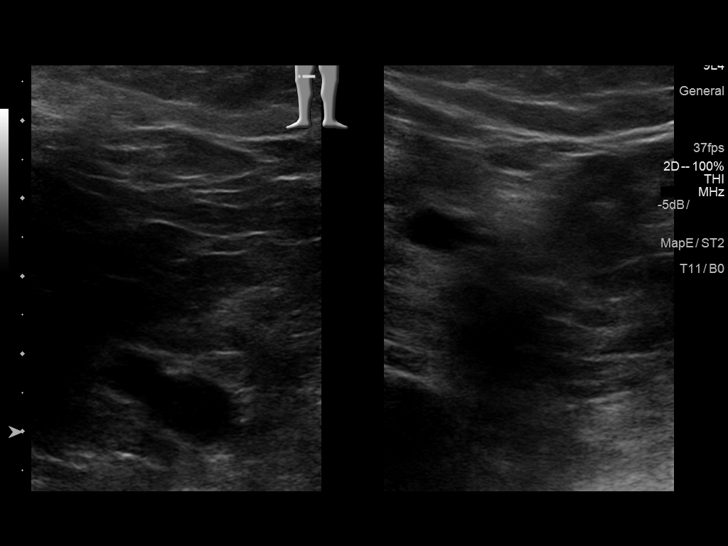
[im 28/34]
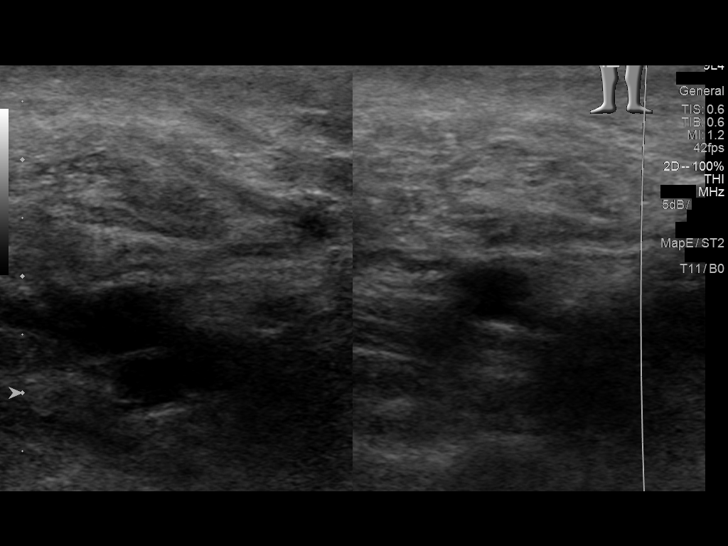
[im 31/34]
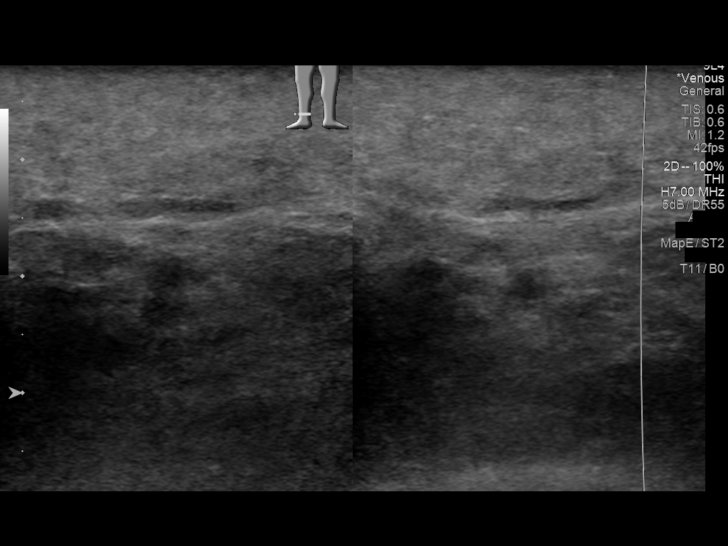
[im 34/34]
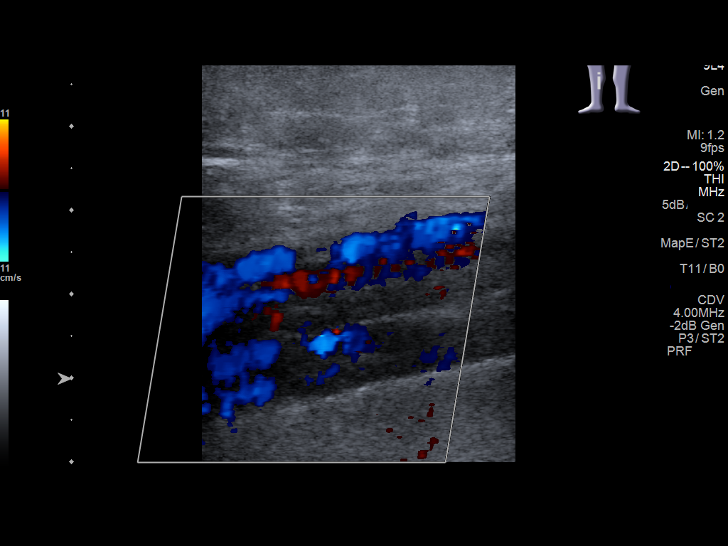

[13 of 24 positions shown; findings below may reference images not displayed]

FINDINGS: Contralateral Common Femoral Vein: Respiratory phasicity is normal
and symmetric with the symptomatic side. No evidence of thrombus.
Normal compressibility.

Common Femoral Vein: No evidence of thrombus. Normal
compressibility, respiratory phasicity and response to augmentation.

Saphenofemoral Junction: No evidence of thrombus. Normal
compressibility and flow on color Doppler imaging.

Profunda Femoral Vein: No evidence of thrombus. Normal
compressibility and flow on color Doppler imaging.

Femoral Vein: No evidence of thrombus. Normal compressibility,
respiratory phasicity and response to augmentation.

Popliteal Vein: No evidence of thrombus. Normal compressibility,
respiratory phasicity and response to augmentation.

Calf Veins: No evidence of thrombus. Normal compressibility and flow
on color Doppler imaging.

Superficial Great Saphenous Vein: No evidence of thrombus. Normal
compressibility and flow on color Doppler imaging.

Venous Reflux:  None.

Other Findings:  None.
IMPRESSION: No evidence of deep venous thrombosis.

## 2018-02-15 ENCOUNTER — Encounter: Payer: Self-pay | Admitting: Hematology and Oncology

## 2018-02-20 ENCOUNTER — Ambulatory Visit: Payer: Medicare Other | Admitting: Primary Care

## 2018-02-22 ENCOUNTER — Ambulatory Visit (INDEPENDENT_AMBULATORY_CARE_PROVIDER_SITE_OTHER): Payer: Medicaid Other | Admitting: Licensed Clinical Social Worker

## 2018-02-22 DIAGNOSIS — F3130 Bipolar disorder, current episode depressed, mild or moderate severity, unspecified: Secondary | ICD-10-CM | POA: Diagnosis not present

## 2018-02-22 DIAGNOSIS — F411 Generalized anxiety disorder: Secondary | ICD-10-CM | POA: Diagnosis not present

## 2018-02-23 ENCOUNTER — Encounter: Payer: Self-pay | Admitting: Primary Care

## 2018-02-23 ENCOUNTER — Telehealth: Payer: Self-pay | Admitting: Primary Care

## 2018-02-23 ENCOUNTER — Ambulatory Visit (INDEPENDENT_AMBULATORY_CARE_PROVIDER_SITE_OTHER): Payer: Medicare Other | Admitting: Primary Care

## 2018-02-23 VITALS — BP 114/70 | HR 84 | Ht 63.0 in | Wt 219.8 lb

## 2018-02-23 DIAGNOSIS — G4733 Obstructive sleep apnea (adult) (pediatric): Secondary | ICD-10-CM

## 2018-02-23 DIAGNOSIS — Z9989 Dependence on other enabling machines and devices: Secondary | ICD-10-CM

## 2018-02-23 NOTE — Progress Notes (Signed)
Reviewed and agree with assessment/plan.   Aphrodite Harpenau, MD [facility] 06/01/2016, 12:24 PM Pager:  [phone]  

## 2018-02-23 NOTE — Assessment & Plan Note (Signed)
-   Stable; no complaints of sob, cough or wheezing - Lung sounds CTA - Continues Symbicort 160 2 puffs twice daily

## 2018-02-23 NOTE — Progress Notes (Signed)
@Patient  ID: Stephanie [name], female    DOB: 1951-03-16, 67 y.o.   MRN: [id number]  Chief Complaint  Patient presents with  . Follow-up    CPAP     Referring provider: Sarajane Jews, MD  HPI:  68 year old female, former smoker (quit 2016). PMH OSA. Patient of Dr. Halford Chessman, last seen on 07/12/16 for OSA. PSG 08/13/13 AHI 27.9. Pressure aset at 8cm H20.   02/23/2018 Patient presents today for CPAP overview. States that machine has been broken for months and has not used since 2018. She in interested in restarting cpap and needs a new machine. Complains of low energy, falls asleep during conversations. Her sleep is not very good. Takes pain medication around 10pm, states that she wakes up after only sleeping for 2 hours and can't fall back to sleep. Denies sob or cough.    Allergies  Allergen Reactions  . Doxycycline Hives, Swelling, Other (See Comments) and Nausea Only    Reaction:  Facial swelling  Face red and swollen; no difficulty breathing    Immunization History  Administered Date(s) Administered  . Influenza Split 03/05/2013  . Influenza Whole 03/07/2007, 02/26/2008, 02/10/2010  . Influenza, High Dose Seasonal PF 03/09/2016  . Influenza,inj,Quad PF,6+ Mos 02/07/2014, 02/11/2015  . PPD Test 09/11/2015  . Pneumococcal Conjugate-13 09/10/2015  . Pneumococcal Polysaccharide-23 04/01/2009  . Td 12/16/2008  . Tdap 04/23/2017  . Zoster 12/13/2010    Past Medical History:  Diagnosis Date  . Adenomatous colon polyp 08/27/2014   Polyps x 3  . Anemia   . Anxiety   . 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 Arthritis   . Asthma   . Bipolar affective disorder ([place])    h/o  . Blood transfusion 2000  . Blood transfusion without reported diagnosis   . Cancer ([place])   . Chronic abdominal pain    abdominal wall pain  . 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 Colon polyp   . Depression   . Diabetes mellitus without complication ([place])   . Fatty liver 04/09/08   found in abd CT  .  GERD (gastroesophageal reflux disease)   . H/O: CVA (cardiovascular accident)    TIA  . H/O: rheumatic fever   . Hepatitis    HEP "B" twenty years ago  . Hyperlipidemia   . Incontinence   . Insomnia   . Lower extremity edema   . Obstructive sleep apnea    Use C-PAP twice per week  . Platelets decreased ([place])   . RLS (restless legs syndrome)   . Shortness of breath   . Sleep apnea   . Stroke [facility])    TIA- 2002  . Ulcer   . Unspecified disorders of nervous system     Tobacco History: Social History   Tobacco Use  Smoking Status Former Smoker  . Packs/day: 1.50  . Years: 50.00  . Pack years: 75.00  . Types: Cigarettes  . Last attempt to quit: 04/06/2013  . Years since quitting: 4.8  Smokeless Tobacco Never Used   Counseling given: Not Answered   Outpatient Medications Prior to Visit  Medication Sig Dispense Refill  . acetaminophen (TYLENOL) 325 MG tablet Take 650 mg by mouth every 6 (six) hours as needed for mild pain.    Marland Kitchen albuterol (PROVENTIL HFA;VENTOLIN HFA) 108 (90 Base) MCG/ACT inhaler Inhale 2 puffs into the lungs every 6 (six) hours as needed for wheezing or shortness of breath.    Marland Kitchen  albuterol (PROVENTIL) (2.5 MG/3ML) 0.083% nebulizer solution Take 2.5 mg by nebulization every 6 (six) hours as needed for wheezing or shortness of breath.    . budesonide-formoterol (SYMBICORT) 160-4.5 MCG/ACT inhaler Inhale 2 puffs into the lungs 2 (two) times daily.     Marland Kitchen buPROPion (WELLBUTRIN XL) 150 MG 24 hr tablet Take 1 tablet (150 mg total) by mouth daily. 90 tablet 1  . clonazePAM (KLONOPIN) 0.5 MG tablet Take 0.5 mg by mouth 3 (three) times daily.     Marland Kitchen dicyclomine (BENTYL) 10 MG capsule Take 10 mg by mouth 3 (three) times daily.     . ferrous sulfate 325 (65 FE) MG tablet Take 325 mg by mouth 2 (two) times daily.     . furosemide (LASIX) 80 MG tablet Take 80 mg by mouth.    . gabapentin (NEURONTIN) 600 MG tablet Take 1 tablet (600 mg total) by mouth 3 (three) times daily.  90 tablet 2  . hydrochlorothiazide (MICROZIDE) 12.5 MG capsule Take 12.5 mg by mouth daily with supper.    . insulin glargine (LANTUS) 100 UNIT/ML injection Inject 50 Units into the skin every evening.     . insulin lispro (HUMALOG) 100 UNIT/ML injection Inject 8 Units into the skin 3 (three) times daily before meals.     . lactulose (CHRONULAC) 10 GM/15ML solution Take 10 g by mouth daily. (0800)    . lamoTRIgine (LAMICTAL) 100 MG tablet Take 1 tablet (100 mg total) by mouth 2 (two) times daily. (0800 & 2000) 180 tablet 1  . lamoTRIgine (LAMICTAL) 25 MG tablet Take 1 tablet (25 mg total) by mouth 2 (two) times daily. To be added to the Lamictal 100 mg at 8 AM and 8 pm 180 tablet 1  . lidocaine (LIDODERM) 5 % Place 2 patches onto the skin daily. Apply 1 patch to 2 areas on back (2 patches) every morning and remove at bedtime.    Marland Kitchen LYRICA 150 MG capsule Take 150 mg by mouth 3 (three) times daily. (0800, 1400 & 2000)    . metFORMIN (GLUCOPHAGE-XR) 500 MG 24 hr tablet Take 500 mg by mouth twice daily.    . metoCLOPramide (REGLAN) 5 MG tablet Take 5 mg by mouth 4 (four) times daily -  before meals and at bedtime. (0800, 1200, 1600 & 2000)    . MYRBETRIQ 25 MG TB24 tablet     . nortriptyline (PAMELOR) 10 MG capsule Take 2 capsules (20 mg total) by mouth at bedtime. For sleep, pain, anxiety 180 capsule 1  . nystatin (MYCOSTATIN/NYSTOP) powder Apply topically 4 (four) times daily. (Patient taking differently: Apply topically 4 (four) times daily. Apply to inner thigh/ peri area twice daily and apply to abdomen 4 times daily.) 15 g 0  . omeprazole (PRILOSEC) 20 MG capsule Take 20 mg by mouth daily.     Marland Kitchen oxybutynin (DITROPAN-XL) 5 MG 24 hr tablet Take 5 mg by mouth daily. (0800)    . RESTASIS 0.05 % ophthalmic emulsion     . rOPINIRole (REQUIP) 3 MG tablet Take 3 mg by mouth at bedtime. (2000)    . spironolactone (ALDACTONE) 50 MG tablet Take 50 mg by mouth 2 (two) times daily. (0800)    . zolpidem  (AMBIEN) 5 MG tablet Take 5 mg by mouth daily.    Marland Kitchen liraglutide (VICTOZA) 18 MG/3ML SOPN Inject 1.8 mg into the skin daily.    . ondansetron (ZOFRAN) 4 MG tablet Take 4 mg by mouth every 4 (four) hours  as needed for nausea or vomiting.    . potassium chloride (K-DUR,KLOR-CON) 10 MEQ tablet Take 1 tablet (10 mEq total) by mouth daily for 4 days. 4 tablet 0  . vitamin C (ASCORBIC ACID) 500 MG tablet Take 500 mg by mouth 2 (two) times daily. (0800 & 2000)     No facility-administered medications prior to visit.     Review of Systems  Review of Systems  Constitutional: Positive for fatigue.  HENT: Negative.   Respiratory: Negative for cough, shortness of breath and wheezing.   Cardiovascular: Negative.   Musculoskeletal: Positive for arthralgias.  Psychiatric/Behavioral: Positive for sleep disturbance.    Physical Exam  BP 114/70 (BP Location: Left Arm, Cuff Size: Normal)   Pulse 84   Ht 5' 3"  (1.6 m)   Wt 219 lb 12.8 oz (99.7 kg)   SpO2 98%   BMI 38.94 kg/m  Physical Exam  Constitutional: She is oriented to person, place, and time. She appears well-developed and well-nourished.  Obese  HENT:  Head: Normocephalic and atraumatic.  Eyes: Pupils are equal, round, and reactive to light. EOM are normal.  Neck: Normal range of motion. Neck supple.  Cardiovascular: Normal rate, regular rhythm and normal heart sounds.  No murmur heard. Pulmonary/Chest: Effort normal and breath sounds normal. No respiratory distress. She has no wheezes.  Abdominal: Soft. Bowel sounds are normal. There is no tenderness.  Musculoskeletal:  Gait not assessed, in [facility]   Neurological: She is alert and oriented to person, place, and time.  Skin: Skin is warm and dry. No rash noted. No erythema.  Psychiatric: She has a normal mood and affect. Her behavior is normal. Judgment normal.     Lab Results:  CBC    Component Value Date/Time   WBC 6.3 01/31/2018 0853   WBC 7.6 11/05/2017 0903   RBC 3.52 (L)  01/31/2018 0853   RBC 3.78 (L) 11/05/2017 0903   HGB 11.2 01/31/2018 0853   HCT 33.3 (L) 01/31/2018 0853   PLT 102 (L) 01/31/2018 0853   MCV 95 01/31/2018 0853   MCV 93 09/29/2014 0920   MCH 31.8 01/31/2018 0853   MCH 31.9 11/05/2017 0903   MCHC 33.6 01/31/2018 0853   MCHC 34.1 11/05/2017 0903   RDW 15.6 (H) 01/31/2018 0853   RDW 14.4 09/29/2014 0920   LYMPHSABS 0.8 01/31/2018 0853   LYMPHSABS 0.9 (L) 09/29/2014 0920   MONOABS 0.8 11/05/2017 0903   MONOABS 0.4 09/29/2014 0920   EOSABS 0.1 01/31/2018 0853   EOSABS 0.2 09/29/2014 0920   BASOSABS 0.0 01/31/2018 0853   BASOSABS 0.1 09/29/2014 0920    BMET    Component Value Date/Time   NA 137 11/05/2017 0903   NA 142 09/15/2015   NA 138 05/03/2014 0746   K 3.7 11/05/2017 0903   K 3.9 05/03/2014 0746   CL 101 11/05/2017 0903   CL 103 05/03/2014 0746   CO2 27 11/05/2017 0903   CO2 28 05/03/2014 0746   GLUCOSE 139 (H) 11/05/2017 0903   GLUCOSE 122 (H) 05/03/2014 0746   BUN 25 (H) 11/05/2017 0903   BUN 19 09/15/2015   BUN 11 05/03/2014 0746   CREATININE 0.92 11/05/2017 0903   CREATININE 0.73 09/29/2014 1019   CALCIUM 9.1 11/05/2017 0903   CALCIUM 9.0 05/03/2014 0746   GFRNONAA >60 11/05/2017 0903   GFRNONAA >60 09/29/2014 1019   GFRAA >60 11/05/2017 0903   GFRAA >60 09/29/2014 1019    BNP    Component Value Date/Time  BNP 108.0 (H) 07/28/2016 1022    ProBNP    Component Value Date/Time   PROBNP 56.0 10/30/2013 1048    Imaging: No results found.   Assessment & Plan:   OSA (obstructive sleep apnea) - PSG 08/13/13 AHI 27.9. Pressure set at 8cm H20.  - Not compliant d/t broken machine, no download for the last 90 days available  - DME referral for new CPAP machine, if denied will need to repeat split night sleep study   Asthma - Stable; no complaints of sob, cough or wheezing - Lung sounds CTA - Continues Symbicort 160 2 puffs twice daily   Martyn Ehrich, NP 02/23/2018

## 2018-02-23 NOTE — Assessment & Plan Note (Addendum)
-   PSG 08/13/13 AHI 27.9. Pressure set at 8cm H20.  - Not compliant d/t broken machine, no download for the last 90 days available  - DME referral for new CPAP machine, if denied will need to repeat split night sleep study

## 2018-02-23 NOTE — Telephone Encounter (Signed)
OSA (obstructive sleep apnea) - PSG 08/13/13 AHI 27.9. Pressure set at 8cm H20.  - Not compliant d/t broken machine, no download for the last 90 days available  - DME referral for new CPAP machine, if denied will need to repeat split night sleep study   ATC patient at number listed-all staff is busy with patients at this time and will need to try back later.

## 2018-02-23 NOTE — Patient Instructions (Addendum)
DME referral to [facility] for new CPAP machine. Pressure set at 8cm H20, mask of choice and supplies (PSG completed on 08/13/13 )  If CPAP denied will need to repeat split night sleep study  FU in 6 weeks after you receive new cpap

## 2018-02-23 NOTE — Telephone Encounter (Signed)
Attempted to call facility, the [facility] [place], unable to leave message and recording keep saying that all the staff is busy with residents. Left message with emergency contact requesting a better contact number for the patient. Will hold in triage and attempt to call back Monday.

## 2018-02-25 IMAGING — CT CT CHEST W/O CM
2 of 4 series · 15 of 36 positions shown, 18 images · non-contrast
Comparison: Plain films 06/16/2017.  CT 04/23/2017

CLINICAL DATA: Chest pain and shortness of breath for 3 weeks.
Follow-up of pulmonary nodule.

EXAM:
CT CHEST WITHOUT CONTRAST
TECHNIQUE: Multidetector CT imaging of the chest was performed following the
standard protocol without IV contrast.

[Series 2: chest w/(date) · axial · 0.77mm/px · z∈[+959,+1201]mm · 12 of 141 slices shown, 15 images]
[im 10/141  mediastinal]
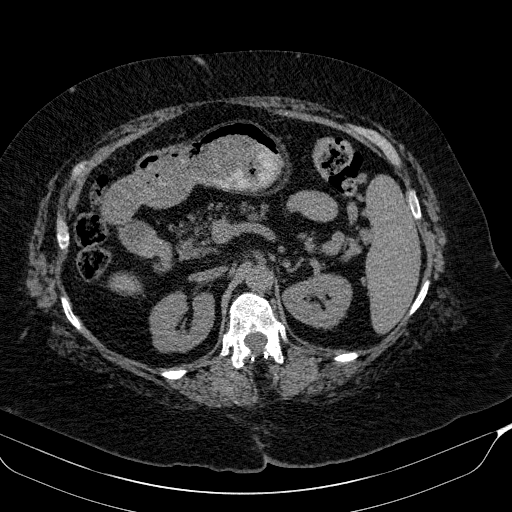
[im 10/141  lung]
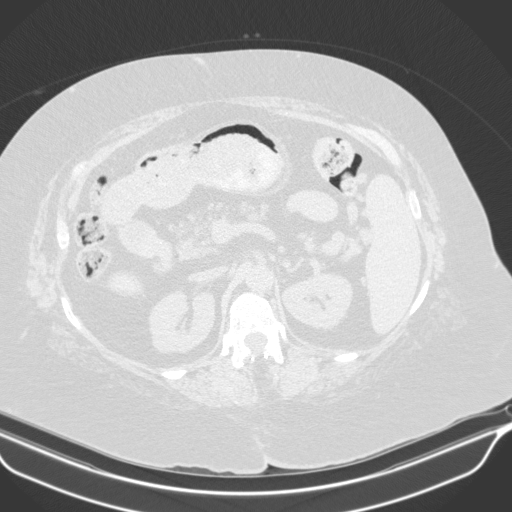
[im 19/141  lung]
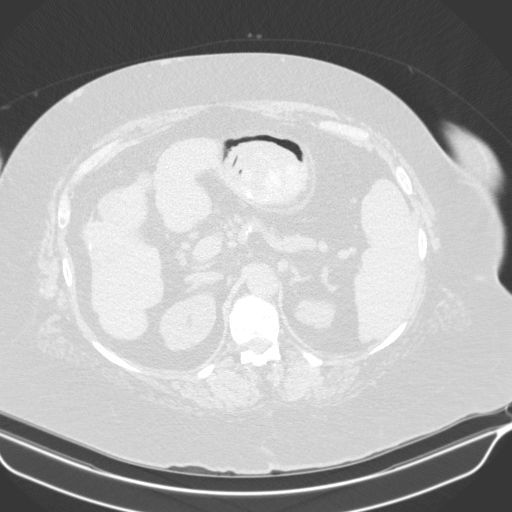
[im 29/141  lung]
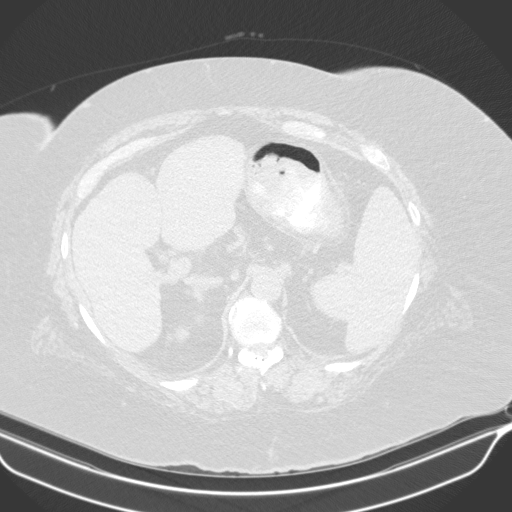
[im 47/141  lung]
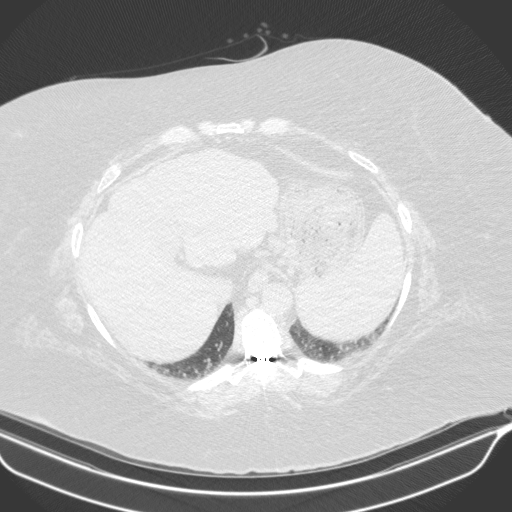
[im 57/141  mediastinal]
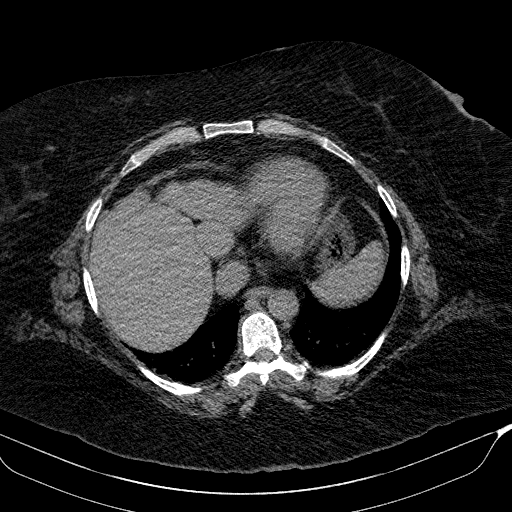
[im 57/141  lung]
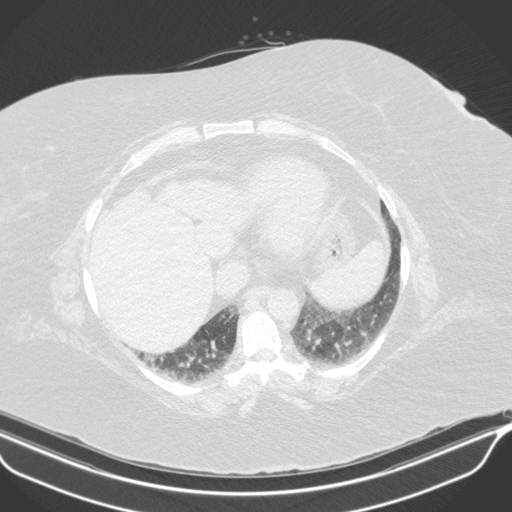
[im 66/141  lung]
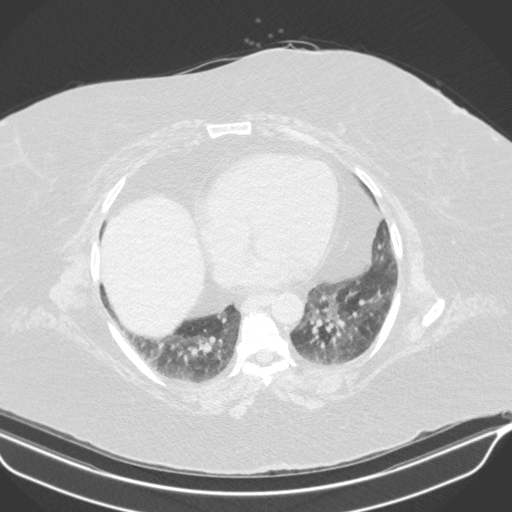
[im 75/141  lung]
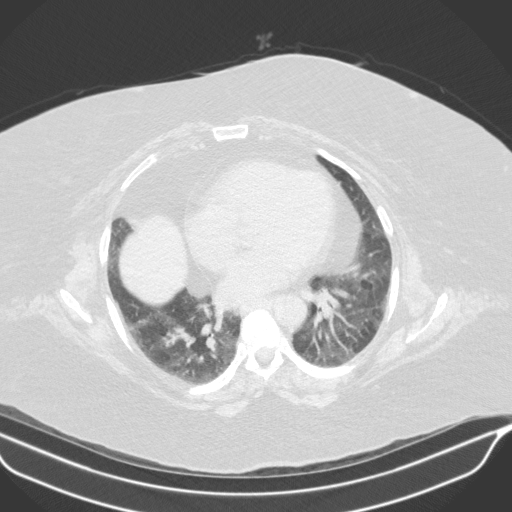
[im 85/141  lung]
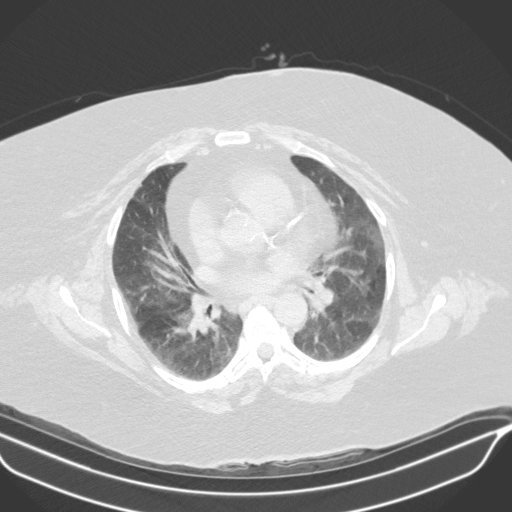
[im 94/141  mediastinal]
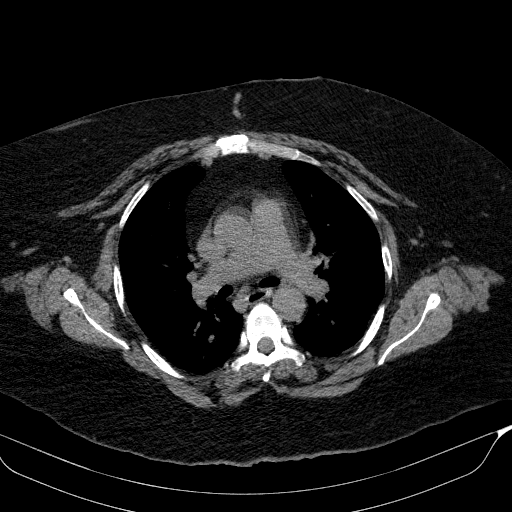
[im 94/141  lung]
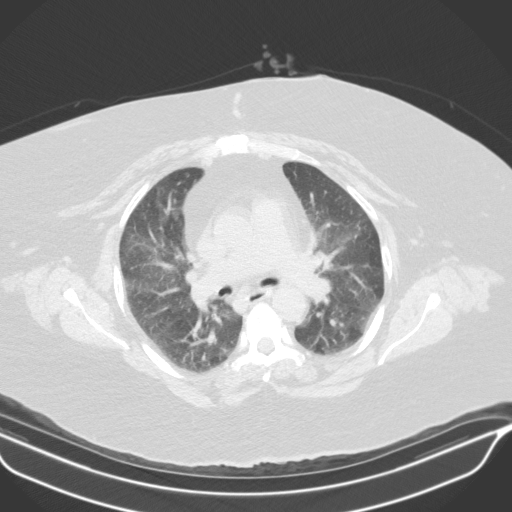
[im 113/141  lung]
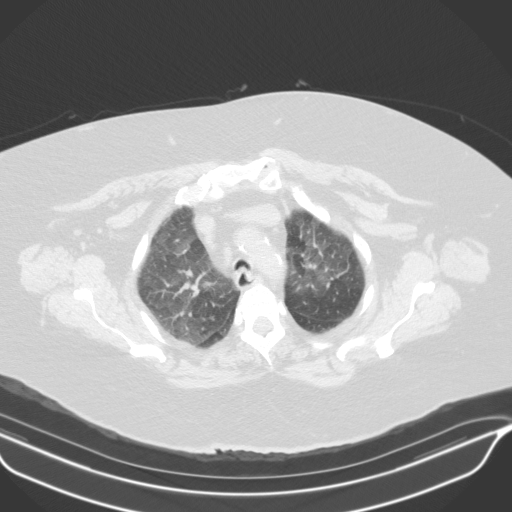
[im 122/141  lung]
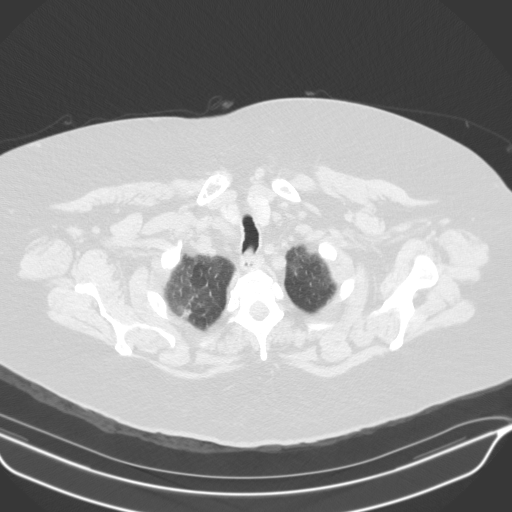
[im 131/141  lung]
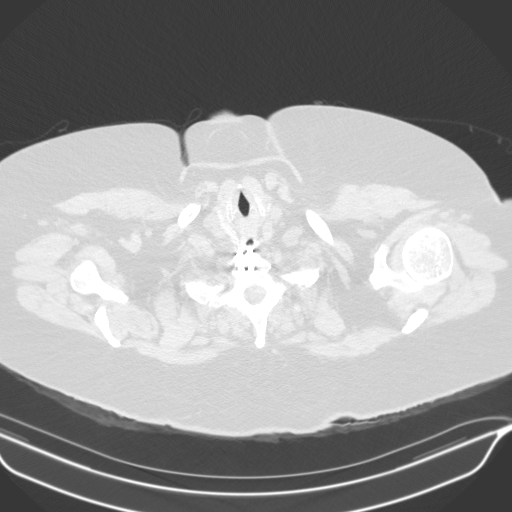

[Series 3: cor · coronal · 0.55mm/px · 3 of 148 slices shown]
[im 30/148  lung]
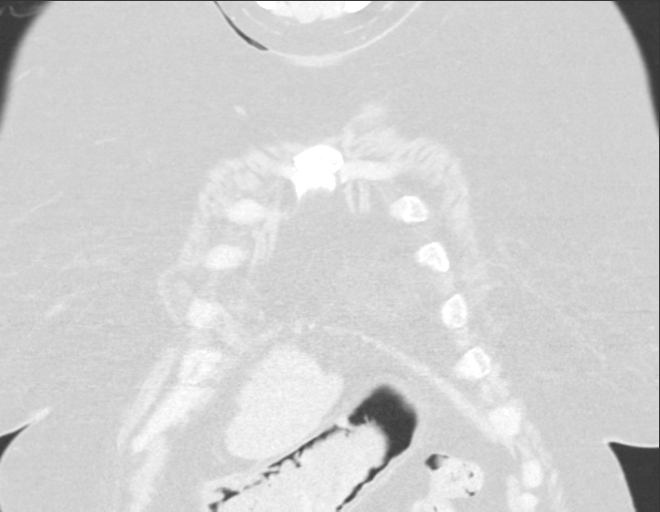
[im 59/148  lung]
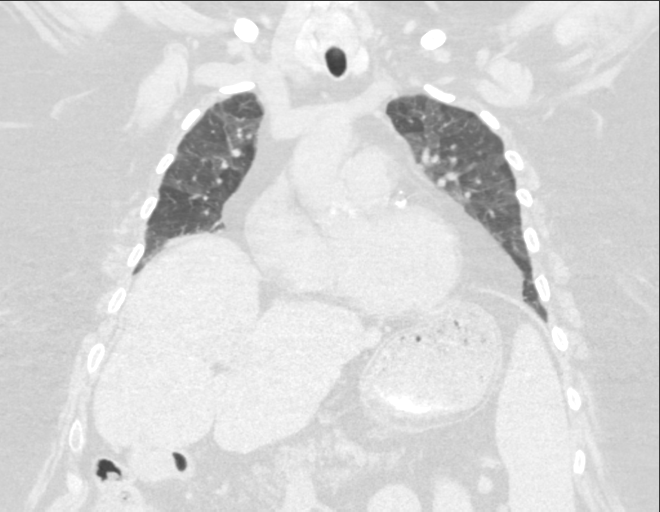
[im 89/148  lung]
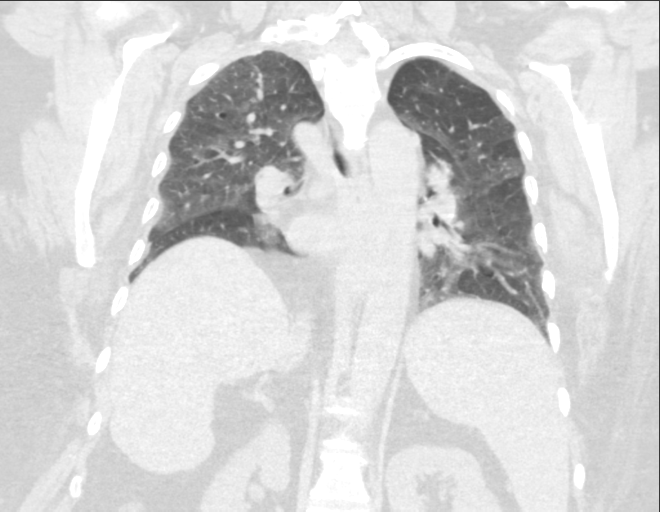

[15 of 36 positions shown; findings below may reference images not displayed]

FINDINGS: Cardiovascular: Aortic and branch vessel atherosclerosis. Tortuous
thoracic aorta. Mild cardiomegaly, without pericardial effusion.
Multivessel coronary artery atherosclerosis.

Mediastinum/Nodes: Small middle mediastinal nodes are not pathologic
by size criteria. Hilar regions poorly evaluated without intravenous
contrast.

Lungs/Pleura: No pleural fluid.

3 mm right upper lobe pulmonary nodule on image 46/series 5 is not
readily apparent on 06/28/2016 exam, but is felt to be similar to
04/23/2017.

Mosaic attenuation throughout both lungs. This is favored to be
related to mosaic perfusion. Hypoventilation bilaterally. No focal
airspace disease.

Upper Abdomen: Marked cirrhosis. Incompletely imaged splenomegaly.
Normal imaged portions of the stomach, adrenal glands, kidneys.
Pancreatic fatty replacement.

Musculoskeletal: Dorsal spinal stimulator. Lower cervical spine
fixation.
IMPRESSION: 1. Mild motion degradation.
2. Mosaic attenuation throughout both lungs, favored to be related
to mosaic perfusion. Given low lung volumes and patient body
habitus, favored to be related to air trapping. Vascular phenomenon,
including chronic pulmonary embolism, could look similar.
3. 3 mm right upper lobe pulmonary nodule, not readily apparent on
exam of 1 year ago. No follow-up needed if patient is low-risk.
Non-contrast chest CT can be considered in 12 months if patient is
high-risk. This recommendation follows the consensus statement:
Guidelines for Management of Incidental Pulmonary Nodules Detected
[DATE].
4. Age advanced coronary artery atherosclerosis. Recommend
assessment of coronary risk factors and consideration of medical
therapy.
5.  Aortic Atherosclerosis ([id number]-TF3.3).
6. Cirrhosis and splenomegaly.

## 2018-02-26 NOTE — Telephone Encounter (Signed)
ATC patient at the facility she resides in Patient is currently at lunch Asked to speak with someone managing her care > was directed to a VM for Amber Would prefer to speak with someone so did not LM  Will route back to triage to try to reach patient later today

## 2018-02-27 ENCOUNTER — Telehealth: Payer: Self-pay | Admitting: Pulmonary Disease

## 2018-02-27 NOTE — Telephone Encounter (Signed)
Pt is returning call. Cb is [phone]

## 2018-02-27 NOTE — Telephone Encounter (Signed)
I have left a message for the patient to call back.

## 2018-02-27 NOTE — Telephone Encounter (Signed)
Stephanie [name] has scheduled this pt for 03/30/18 Stephanie [name] will contact pt about appt

## 2018-02-27 NOTE — Telephone Encounter (Signed)
Contacted the [employer], and spoke to [place]. [place] is aware of below results and voiced her understanding. Nothing further is needed.

## 2018-02-27 NOTE — Telephone Encounter (Signed)
Called and spoke to patient. Relayed information to patient. Placed order for a split night study. Routing to [facility] pool to follow up on.

## 2018-03-01 ENCOUNTER — Ambulatory Visit: Payer: Medicaid Other | Admitting: Psychiatry

## 2018-03-08 ENCOUNTER — Encounter: Payer: Self-pay | Admitting: Psychiatry

## 2018-03-08 ENCOUNTER — Other Ambulatory Visit: Payer: Self-pay

## 2018-03-08 ENCOUNTER — Ambulatory Visit (INDEPENDENT_AMBULATORY_CARE_PROVIDER_SITE_OTHER): Payer: Medicare Other | Admitting: Psychiatry

## 2018-03-08 VITALS — BP 127/77 | HR 83 | Temp 97.6°F

## 2018-03-08 DIAGNOSIS — F411 Generalized anxiety disorder: Secondary | ICD-10-CM

## 2018-03-08 DIAGNOSIS — F3130 Bipolar disorder, current episode depressed, mild or moderate severity, unspecified: Secondary | ICD-10-CM | POA: Diagnosis not present

## 2018-03-08 DIAGNOSIS — F5105 Insomnia due to other mental disorder: Secondary | ICD-10-CM | POA: Diagnosis not present

## 2018-03-08 NOTE — Progress Notes (Signed)
[facility] MD OP Progress Note  03/08/2018 2:21 PM Stephanie [name]  MRN:  [id number]  Chief Complaint: ' I am here for follow up." Chief Complaint    Follow-up; Medication Refill     HPI: Stephanie [name] is a 67 yr old Caucasian female, divorced, lives at [facility] [place], on [place], has a history of bipolar disorder, generalized anxiety disorder, RLS, chronic pain, insomnia as well as multiple medical problems including COPD, history of lung nodules, sleep apnea, ascites, status post cholecystectomy, history of hepatitis B, history of stroke, history of vitamin D deficiency, history of aortic dissection, hyperlipidemia, hypertension, thrombocytopenia, chronic anemia, presented to the clinic today for a follow-up visit.  Patient today presented along with staff at [facility].  Patient presented in a wheelchair.  Patient today reports her mood symptoms as improved since her last visit here.  She continues to struggle with pain problems however reports she is trying to get help for the same.  She had the appointment with the surgeon and was told that she does not need surgery at this time.  She is currently working with the physical therapist and has been trying to use a walker to walk often.  Patient reports she is compliant on her medications as prescribed.  Patient continues to have some restlessness at night with regards to her sleep.  She reports it is mostly due to the pain.  She reports getting up to 4 to 5 hours of good sleep.  She denies any suicidality.  Patient denies any homicidality.  Patient denies any perceptual disturbances.  Patient will continue to work with therapist Stephanie [name] and has an up coming appointment soon.   Visit Diagnosis:    ICD-10-CM   1. Bipolar I disorder, most recent episode depressed ([place]) F31.30   2. GAD (generalized anxiety disorder) F41.1   3. Insomnia due to mental disorder F51.05     Past Psychiatric History: Reviewed past psychiatric history from my progress note on  08/24/2017.  Past trials of Seroquel, trazodone, Ambien.  Past Medical History:  Past Medical History:  Diagnosis Date  . Adenomatous colon polyp 08/27/2014   Polyps x 3  . Anemia   . Anxiety   . 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 Arthritis   . Asthma   . Bipolar affective disorder ([place])    h/o  . Blood transfusion 2000  . Blood transfusion without reported diagnosis   . Cancer ([place])   . Chronic abdominal pain    abdominal wall pain  . 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 Colon polyp   . Depression   . Diabetes mellitus without complication ([place])   . Fatty liver 04/09/08   found in abd CT  . GERD (gastroesophageal reflux disease)   . H/O: CVA (cardiovascular accident)    TIA  . H/O: rheumatic fever   . Hepatitis    HEP "B" twenty years ago  . Hyperlipidemia   . Incontinence   . Insomnia   . Lower extremity edema   . Obstructive sleep apnea    Use C-PAP twice per week  . Platelets decreased ([place])   . RLS (restless legs syndrome)   . Shortness of breath   . Sleep apnea   . Stroke [facility])    TIA- 2002  . Ulcer   . Unspecified disorders of nervous system     Past Surgical History:  Procedure Laterality Date  . ABDOMINAL EXPLORATION SURGERY    .  ABDOMINAL HYSTERECTOMY     total  . Axillary artery cannulation via 8-mm Hemashield graft, median sternotomy, extracorporeal circulation with deep hypothermic circulatory arrest, repair of  aortic dessection  01/23/2009   Dr Arlyce Dice  . BACK SURGERY      x 5  . cataract     both eyes  . CHOLECYSTECTOMY    . COLONOSCOPY    . EYE SURGERY Bilateral    Cataract Extraction with IOL  . HEMORROIDECTOMY     and colon polyp removed  . KNEE ARTHROSCOPY WITH LATERAL RELEASE Right 11/15/2016   Procedure: KNEE ARTHROSCOPY WITH LATERAL RELEASE;  Surgeon: Corky Mull, MD;  Location: [facility] OR[facility];  Service: Orthopedics;  Laterality: Right;  . LIVER BIOPSY  04/10/2012   Procedure: LIVER BIOPSY;  Surgeon: Inda Castle, MD;  Location:  [facility];  Service: Endoscopy;  Laterality: N/A;  ultrasound to mark order for abd limited/liver to be marked to be sent by [email or web address]   . LUMBAR FUSION  10/09  . LUMBAR WOUND DEBRIDEMENT  05/09/2011   Procedure: LUMBAR WOUND DEBRIDEMENT;  Surgeon: Dahlia Bailiff;  Location: [facility];  Service: Orthopedics;  Laterality: N/A;  IRRIGATION AND DEBRIDEMENT SPINAL WOUND  . POSTERIOR CERVICAL FUSION/FORAMINOTOMY    . ROTATOR CUFF REPAIR     left    Family Psychiatric History: Reviewed family psychiatric history from my progress note on 08/24/2017  Family History:  Family History  Problem Relation Age of Onset  . Breast cancer Mother   . Lung cancer Mother   . Stomach cancer Father   . Diabetes Unknown        4 aunts, and 1 uncle  . Depression Brother   . Anxiety disorder Brother   . Colon cancer Neg Hx   . Esophageal cancer Neg Hx   . Rectal cancer Neg Hx     Social History: Have reviewed social history from my progress note on 08/24/2017 Social History   Socioeconomic History  . Marital status: Divorced    Spouse name: Not on file  . Number of children: 2  . Years of education: Not on file  . Highest education level: 8th grade  Occupational History  . Occupation: Disabled    Employer: [employer]  Social Needs  . Financial resource strain: Not hard at all  . Food insecurity:    Worry: Never true    Inability: Never true  . Transportation needs:    Medical: No    Non-medical: No  Tobacco Use  . Smoking status: Former Smoker    Packs/day: 1.50    Years: 50.00    Pack years: 75.00    Types: Cigarettes    Last attempt to quit: 04/06/2013    Years since quitting: 4.9  . Smokeless tobacco: Never Used  Substance and Sexual Activity  . Alcohol use: No    Alcohol/week: 0.0 standard drinks  . Drug use: No  . Sexual activity: Never  Lifestyle  . Physical activity:    Days per week: 0 days    Minutes per session: 0 min  . Stress: Very much  Relationships  . Social  connections:    Talks on phone: Not on file    Gets together: Not on file    Attends religious service: Never    Active member of club or organization: No    Attends meetings of clubs or organizations: Never    Relationship status: Divorced  Other Topics Concern  . Not on file  Social History  Narrative   1 son, 45+ y/o   Daily caffeine use: 2 cups daily   Does not get regular exercise   Disability due to bipolar      Has a living will- she is a DNR (she has form at home per pt).    Allergies:  Allergies  Allergen Reactions  . Doxycycline Hives, Swelling, Other (See Comments) and Nausea Only    Reaction:  Facial swelling  Face red and swollen; no difficulty breathing    Metabolic Disorder Labs: Lab Results  Component Value Date   HGBA1C 5.2 07/20/2016   MPG 103 07/20/2016   MPG 131 04/18/2016   No results found for: PROLACTIN Lab Results  Component Value Date   CHOL 194 04/05/2016   TRIG 47 04/05/2016   HDL 110 04/05/2016   CHOLHDL 1.8 04/05/2016   VLDL 9 04/05/2016   LDLCALC 75 04/05/2016   LDLCALC 59 12/15/2014   Lab Results  Component Value Date   TSH 4.486 07/19/2017   TSH 4.24 12/15/2014    Therapeutic Level Labs: No results found for: LITHIUM No results found for: VALPROATE No components found for:  CBMZ  Current Medications: Current Outpatient Medications  Medication Sig Dispense Refill  . acetaminophen (TYLENOL) 325 MG tablet Take 650 mg by mouth every 6 (six) hours as needed for mild pain.    Marland Kitchen albuterol (PROVENTIL HFA;VENTOLIN HFA) 108 (90 Base) MCG/ACT inhaler Inhale 2 puffs into the lungs every 6 (six) hours as needed for wheezing or shortness of breath.    Marland Kitchen albuterol (PROVENTIL) (2.5 MG/3ML) 0.083% nebulizer solution Take 2.5 mg by nebulization every 6 (six) hours as needed for wheezing or shortness of breath.    . budesonide-formoterol (SYMBICORT) 160-4.5 MCG/ACT inhaler Inhale 2 puffs into the lungs 2 (two) times daily.     Marland Kitchen buPROPion  (WELLBUTRIN XL) 150 MG 24 hr tablet Take 1 tablet (150 mg total) by mouth daily. 90 tablet 1  . clonazePAM (KLONOPIN) 0.5 MG tablet Take 0.5 mg by mouth 3 (three) times daily.     Marland Kitchen dicyclomine (BENTYL) 10 MG capsule Take 10 mg by mouth 3 (three) times daily.     . ferrous sulfate 325 (65 FE) MG tablet Take 325 mg by mouth 2 (two) times daily.     . furosemide (LASIX) 80 MG tablet Take 80 mg by mouth.    . gabapentin (NEURONTIN) 600 MG tablet Take 1 tablet (600 mg total) by mouth 3 (three) times daily. 90 tablet 2  . hydrochlorothiazide (MICROZIDE) 12.5 MG capsule Take 12.5 mg by mouth daily with supper.    . insulin glargine (LANTUS) 100 UNIT/ML injection Inject 50 Units into the skin every evening.     . insulin lispro (HUMALOG) 100 UNIT/ML injection Inject 8 Units into the skin 3 (three) times daily before meals.     . lactulose (CHRONULAC) 10 GM/15ML solution Take 10 g by mouth daily. (0800)    . lamoTRIgine (LAMICTAL) 100 MG tablet Take 1 tablet (100 mg total) by mouth 2 (two) times daily. (0800 & 2000) 180 tablet 1  . lamoTRIgine (LAMICTAL) 25 MG tablet Take 1 tablet (25 mg total) by mouth 2 (two) times daily. To be added to the Lamictal 100 mg at 8 AM and 8 pm 180 tablet 1  . lidocaine (LIDODERM) 5 % Place 2 patches onto the skin daily. Apply 1 patch to 2 areas on back (2 patches) every morning and remove at bedtime.    . liraglutide ([place])  18 MG/3ML SOPN Inject 1.8 mg into the skin daily.    Marland Kitchen LYRICA 150 MG capsule Take 150 mg by mouth 3 (three) times daily. (0800, 1400 & 2000)    . metFORMIN (GLUCOPHAGE-XR) 500 MG 24 hr tablet Take 500 mg by mouth twice daily.    . metoCLOPramide (REGLAN) 5 MG tablet Take 5 mg by mouth 4 (four) times daily -  before meals and at bedtime. (0800, 1200, 1600 & 2000)    . MYRBETRIQ 25 MG TB24 tablet     . nortriptyline (PAMELOR) 10 MG capsule Take 2 capsules (20 mg total) by mouth at bedtime. For sleep, pain, anxiety 180 capsule 1  . nystatin  (MYCOSTATIN/NYSTOP) powder Apply topically 4 (four) times daily. (Patient taking differently: Apply topically 4 (four) times daily. Apply to inner thigh/ peri area twice daily and apply to abdomen 4 times daily.) 15 g 0  . omeprazole (PRILOSEC) 20 MG capsule Take 20 mg by mouth daily.     . ondansetron (ZOFRAN) 4 MG tablet Take 4 mg by mouth every 4 (four) hours as needed for nausea or vomiting.    Marland Kitchen oxybutynin (DITROPAN-XL) 5 MG 24 hr tablet Take 5 mg by mouth daily. (0800)    . RESTASIS 0.05 % ophthalmic emulsion     . rOPINIRole (REQUIP) 3 MG tablet Take 3 mg by mouth at bedtime. (2000)    . spironolactone (ALDACTONE) 50 MG tablet Take 50 mg by mouth 2 (two) times daily. (0800)    . vitamin C (ASCORBIC ACID) 500 MG tablet Take 500 mg by mouth 2 (two) times daily. (0800 & 2000)    . zolpidem (AMBIEN) 5 MG tablet Take 5 mg by mouth daily.    Marland Kitchen oxyCODONE (OXY IR/ROXICODONE) 5 MG immediate release tablet 5 mg.    . potassium chloride (K-DUR,KLOR-CON) 10 MEQ tablet Take 1 tablet (10 mEq total) by mouth daily for 4 days. 4 tablet 0   No current facility-administered medications for this visit.      Musculoskeletal: Strength & Muscle Tone: within normal limits Gait & Station: in a wheelchair Patient leans: N/A  Psychiatric Specialty Exam: Review of Systems  All other systems reviewed and are negative.   Blood pressure 127/77, pulse 83, temperature 97.6 F (36.4 C), temperature source Oral.There is no height or weight on file to calculate BMI.  General Appearance: Casual  Eye Contact:  Fair  Speech:  Clear and Coherent  Volume:  Normal  Mood:  Dysphoric  Affect:  Congruent  Thought Process:  Goal Directed and Descriptions of Associations: Intact  Orientation:  Full (Time, Place, and Person)  Thought Content: Logical   Suicidal Thoughts:  No  Homicidal Thoughts:  No  Memory:  Immediate;   Fair Recent;   Fair Remote;   Fair  Judgement:  Fair  Insight:  Fair  Psychomotor Activity:   Normal  Concentration:  Concentration: Fair and Attention Span: Fair  Recall:  [employer] of Knowledge: Fair  Language: Fair  Akathisia:  No  Handed:  Right  AIMS (if indicated): na  Assets:  Communication Skills Desire for Improvement Social Support  ADL's:  Intact  Cognition: WNL  Sleep:  improving   Screenings: PHQ2-9     Clinical Support from 01/04/2018 in [facility] Clinical Support from 11/16/2017 in [facility] Office Visit from 10/18/2017 in [facility] Procedure visit from 10/04/2017 in [facility]  [facility] Office Visit from 09/25/2017 in [facility]  PHQ-2 Total Score  0  0  1  0  0       Assessment and Plan: Wilhelmine is a 67 year old Caucasian female, divorced, on SSD, has a history of bipolar disorder, anxiety disorder, insomnia, RLS, sleep apnea, multiple medical problems like history of stroke, hepatitis B, thrombocytopenia, COPD, ascites, hyperlipidemia, hypertension, aortic dissection, vitamin D deficiency, presented to the clinic today for a follow-up visit.  Patient today presented with staff at an assisted living facility.  Patient reports improvement in her mood symptoms.  She will continue to work with her psychotherapist as well as continue medications.  Plan as noted below.  Plan Bipolar disorder Continue Lamictal 125 mg p.o. twice daily Continue Wellbutrin XL 150 mg p.o. Daily.  GAD Continue Klonopin as prescribed She will continue psychotherapy with Ms. Royal Piedra for now.  For insomnia Pamelor 20 mg p.o. nightly  Follow-up in clinic in 2 months or sooner if needed.  More than 50 % of the time was spent for psychoeducation and supportive psychotherapy and care coordination.  This note was generated in part or whole with voice recognition software.  Voice recognition is usually quite accurate but there are transcription errors that can and very often do occur. I apologize for any typographical errors that were not detected and corrected.       Ursula Alert, MD 03/08/2018, 2:21 PM

## 2018-03-09 IMAGING — CT CT HEAD W/O CM
3 series · 15 of 47 positions shown, 18 images · non-contrast
Comparison: Head CT 04/23/2017 and earlier.

CLINICAL DATA: 66-year-old female with altered mental status,
unresponsive.

EXAM:
CT HEAD WITHOUT CONTRAST
TECHNIQUE: Contiguous axial images were obtained from the base of the skull
through the vertex without intravenous contrast.

[Series 2: head wo · axial · 0.42mm/px · z∈[+627,+757]mm · 9 of 32 slices shown, 12 images]
[im 3/32  brain]
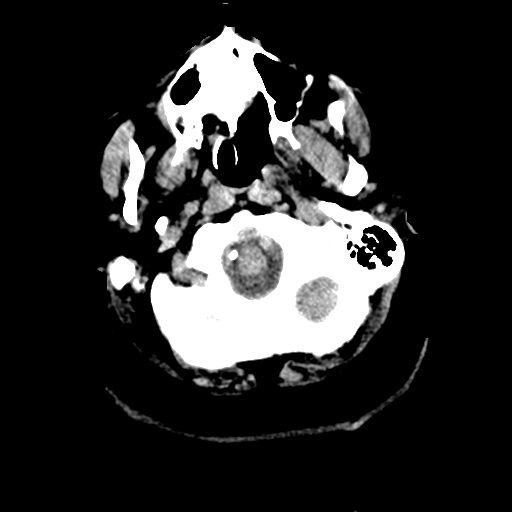
[im 3/32  bone]
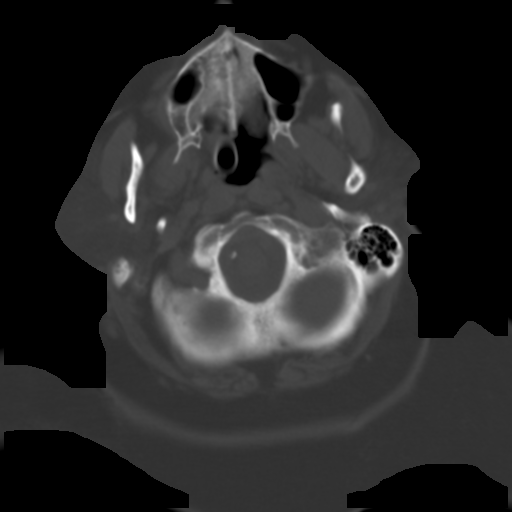
[im 6/32  brain]
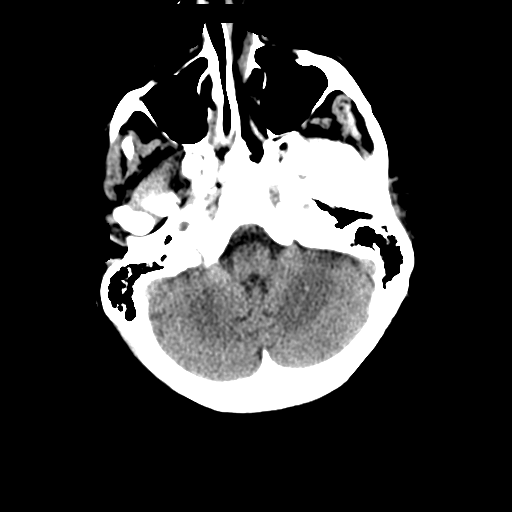
[im 9/32  brain]
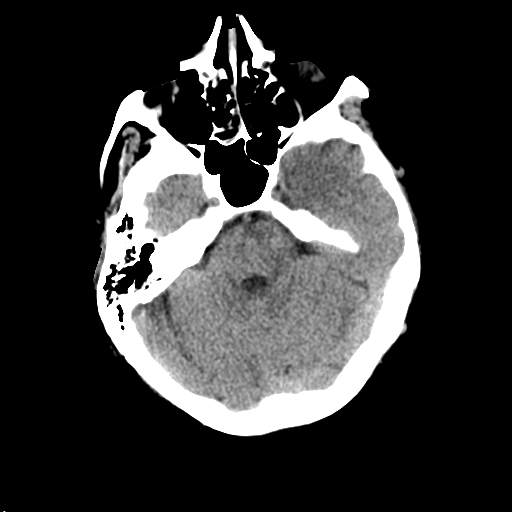
[im 12/32  brain]
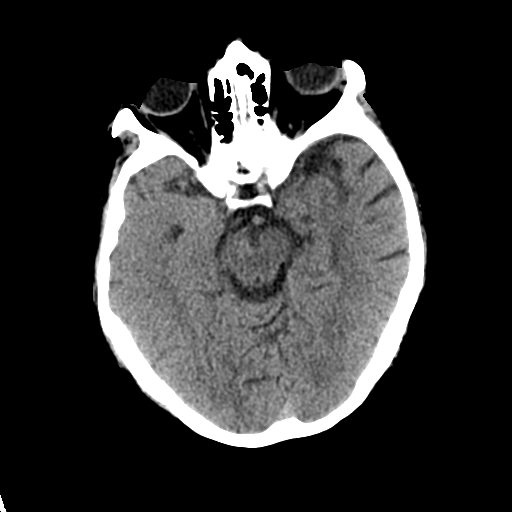
[im 17/32  brain]
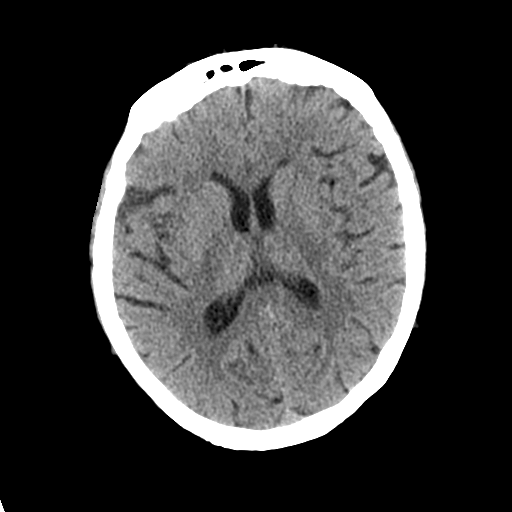
[im 17/32  bone]
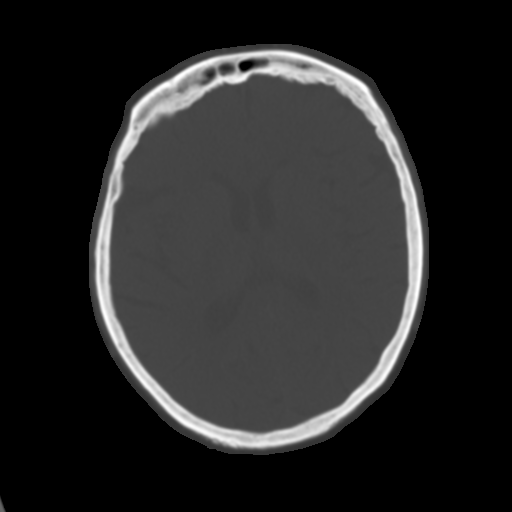
[im 20/32  brain]
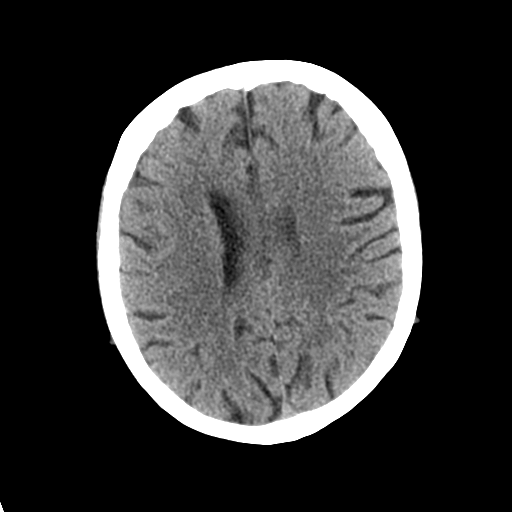
[im 23/32  brain]
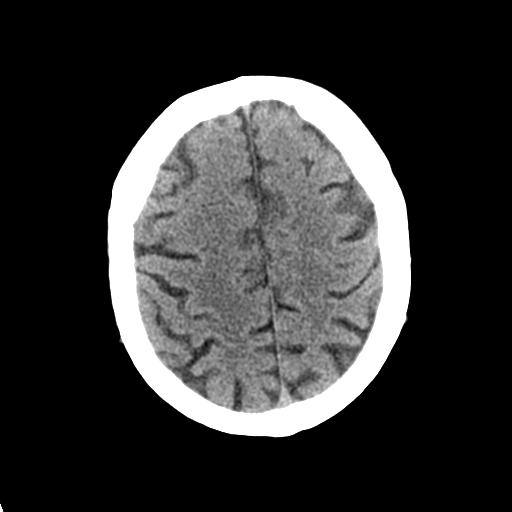
[im 26/32  brain]
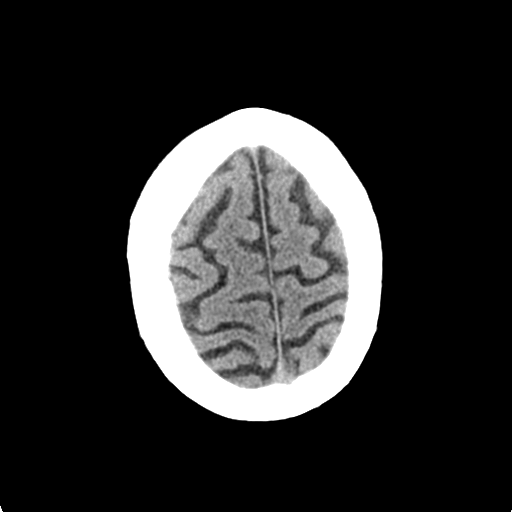
[im 29/32  brain]
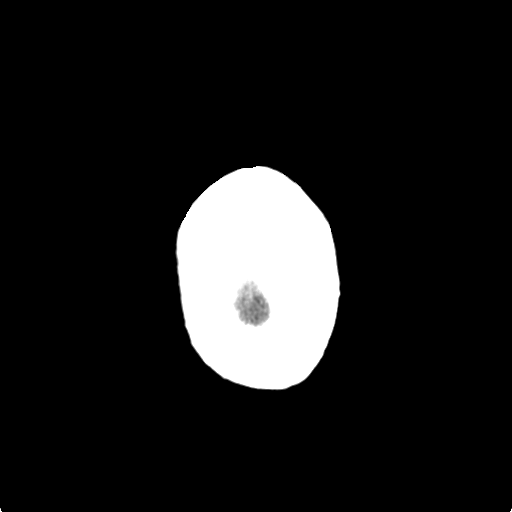
[im 29/32  bone]
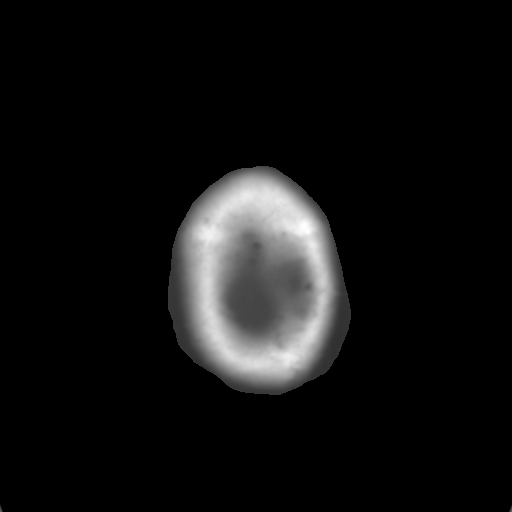

[Series 4: coronal soft tissue · coronal · 0.30mm/px · 3 of 60 slices shown]
[im 20/60  brain]
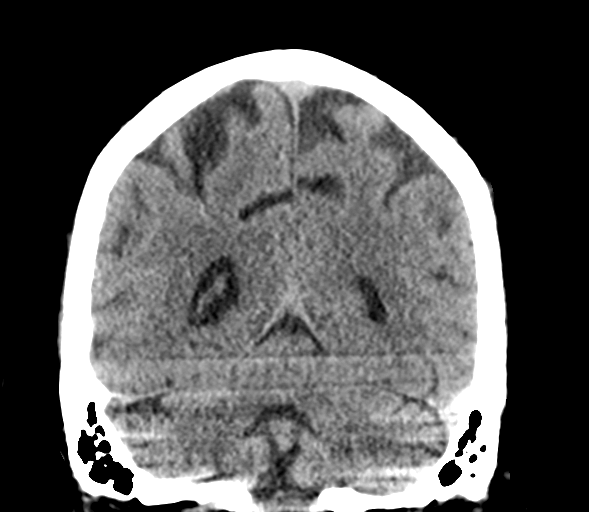
[im 27/60  brain]
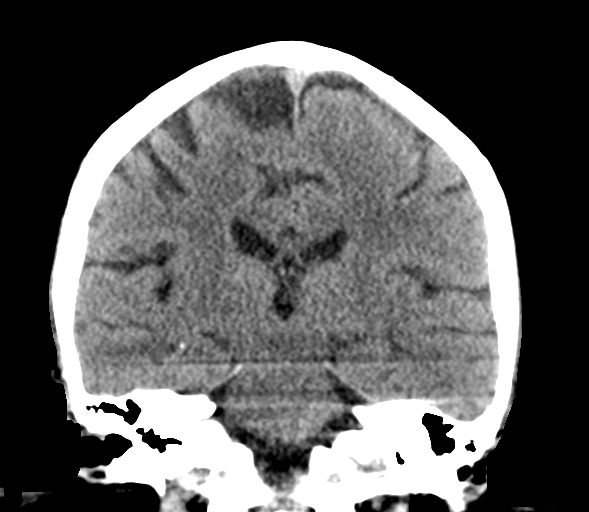
[im 33/60  brain]
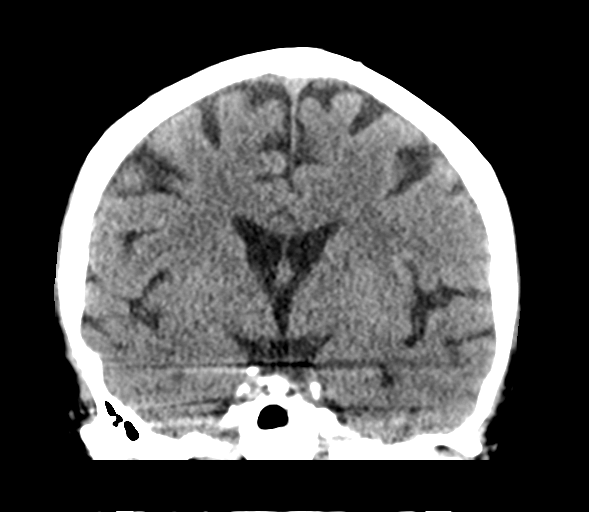

[Series 5: sagittal soft tissue · sagittal · 0.29mm/px · 3 of 49 slices shown]
[im 17/49  brain]
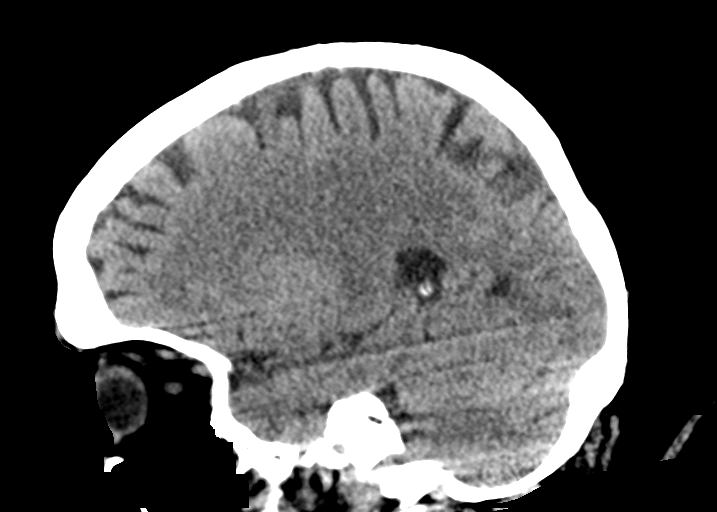
[im 25/49  brain]
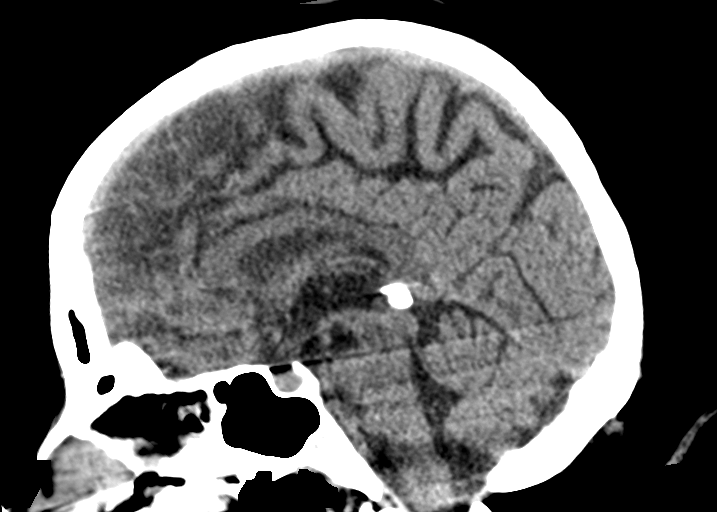
[im 33/49  brain]
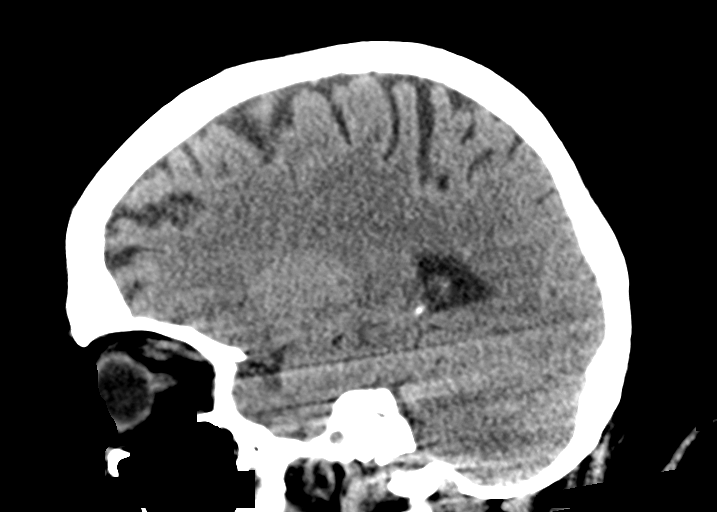

[15 of 47 positions shown; findings below may reference images not displayed]

FINDINGS: Brain: Cerebral volume is stable and normal for age. No midline
shift, ventriculomegaly, mass effect, evidence of mass lesion,
intracranial hemorrhage or evidence of cortically based acute
infarction. Gray-white matter differentiation is within normal
limits throughout the brain. No cortical encephalomalacia
identified.

Vascular: Calcified atherosclerosis at the skull base. No suspicious
intracranial vascular hyperdensity.

Skull: Negative.

Sinuses/Orbits: There is a right nasopharyngeal airway in place.
Small volume retained secretions in the right nasal cavity and
nasopharynx.

Visualized paranasal sinuses and mastoids are stable and well
pneumatized.

Other: No acute orbit or scalp soft tissue findings.
IMPRESSION: 1. Stable and normal for age non contrast CT appearance of the
brain.
2. Right nasopharyngeal airway in place.

## 2018-03-09 IMAGING — DX DG PELVIS 1-2V
1 series · 1 of 1 positions shown · non-contrast
Comparison: CT Abdomen and Pelvis 04/23/2017

CLINICAL DATA: 66-year-old female with altered mental status,
unresponsive.

EXAM:
PELVIS - 1-2 VIEW

[pelvis ap]
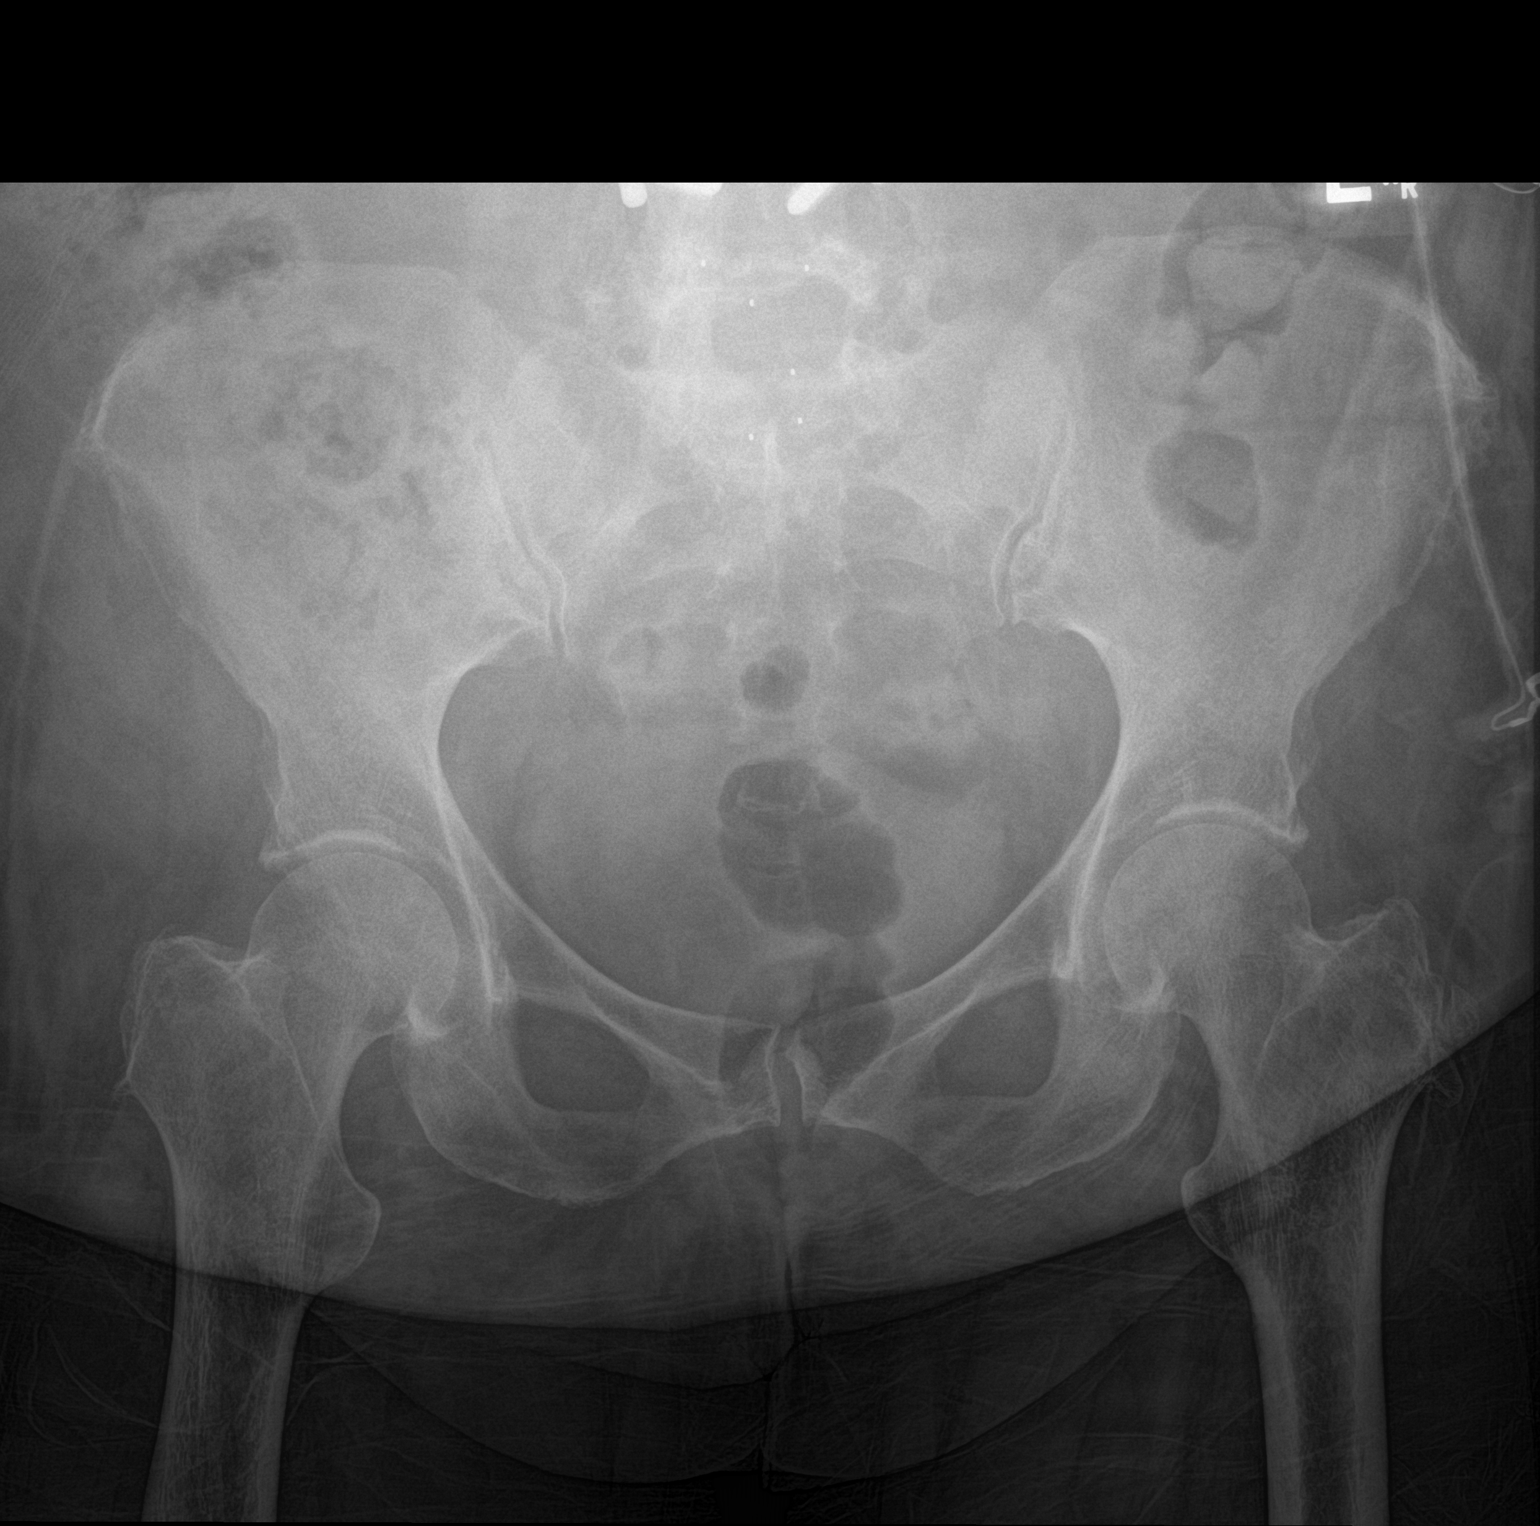

[1 of 1 positions shown; findings below may reference images not displayed]

FINDINGS: Femoral heads are normally located. Hip joint spaces remain normal.
Proximal femurs appear intact. The pelvis appears stable and intact.
Negative SI joints. Chronic postoperative changes to the lower
lumbar spine including L3-L4 transpedicular hardware. Negative
visible bowel gas pattern.
IMPRESSION: No acute findings.

## 2018-03-09 IMAGING — DX DG CHEST 1V PORT
1 series · 1 of 1 positions shown · non-contrast
Comparison: Chest CT 07/07/2017, chest radiographs 06/16/2017, and
earlier.

CLINICAL DATA: 66-year-old female with altered mental status,
unresponsive.

EXAM:
PORTABLE CHEST 1 VIEW

[chest ap]
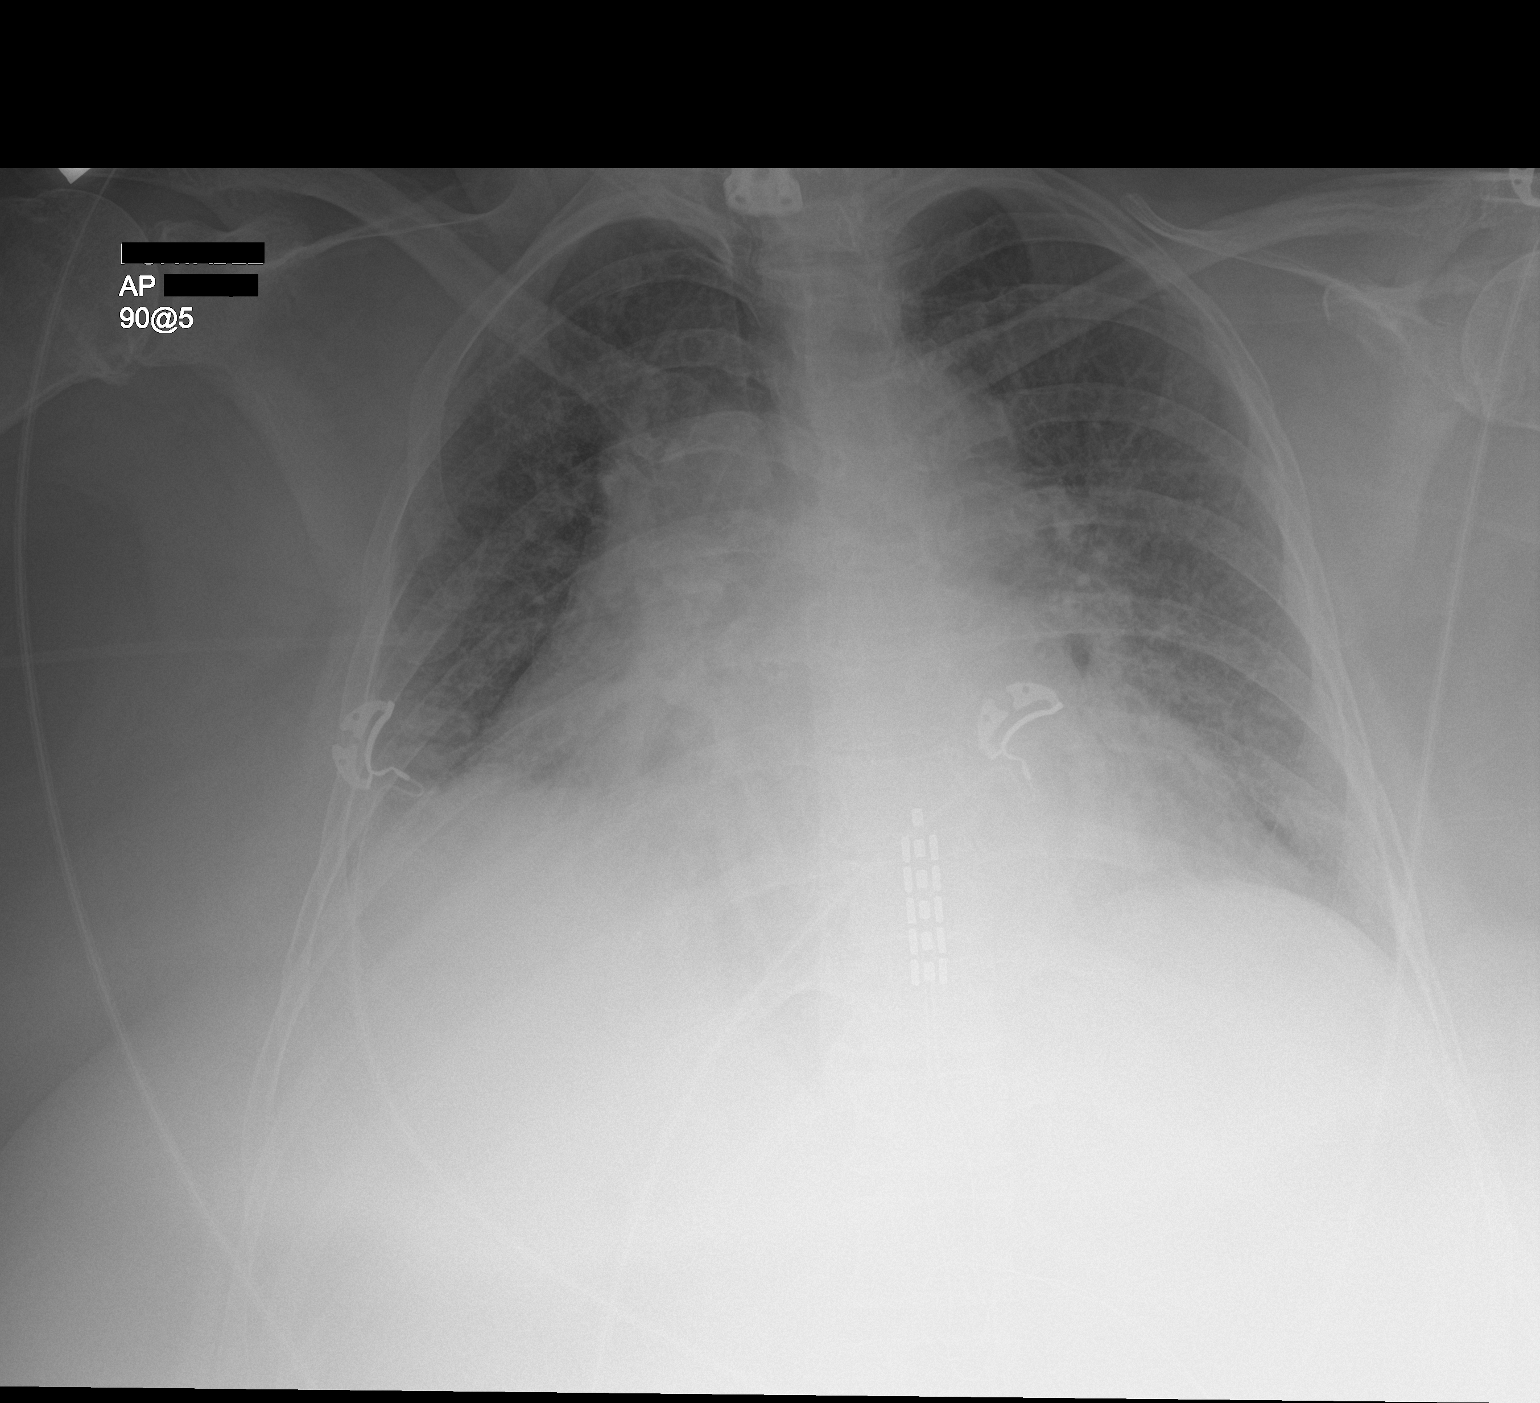

[1 of 1 positions shown; findings below may reference images not displayed]

FINDINGS: Portable AP upright view at 2423 hrs. Large body habitus. Mildly
lower lung volumes. Mildly increased hypo ventilation at both lung
bases, but no pneumothorax or pleural effusion. No definite
pulmonary edema. Stable cardiac size and mediastinal contours.
Stable lower thoracic spinal stimulator device and prior cervical
ACDF. Paucity of bowel gas in the upper abdomen.
IMPRESSION: Large body habitus with lower lung volumes and mildly increased lung
base atelectasis compared to radiographs in [DATE].

## 2018-03-13 ENCOUNTER — Telehealth: Payer: Self-pay

## 2018-03-13 NOTE — Progress Notes (Signed)
   THERAPIST PROGRESS NOTE  Session Time: 6mn  Participation Level: Active  Behavioral Response: CasualAlertEuthymic  Type of Therapy: Individual Therapy  Treatment Goals addressed: Coping  Interventions: CBT  Summary: Stephanie RIGDONis a 67y.o. female who presents with a reduction in symptoms.  Patient reports a "favorable" mood.  Patient vented frustration and stressors.  Therapist normalized feelings and introduced guided imagery.  Modeled guided imagery and gave instruction on how to complete while at the facility.  Discussion on sleep pattern and how to create a sleep environment.     Suicidal/Homicidal: No  Plan: Return again in 2 weeks.  Diagnosis: Axis I: Bipolar, Depressed and Generalized Anxiety Disorder    Axis II: No diagnosis    NLubertha South LCSW 02/22/2018

## 2018-03-13 NOTE — Telephone Encounter (Signed)
Kieth Brightly from the [facility] [phone]) called and stated pt doesn't have an appt until 04/09/18 , Pt is out of meds and wants [name] to send RX so they can fill it at there facility if possible

## 2018-03-13 NOTE — Telephone Encounter (Signed)
Attempted to call the [name] and speak to [name].  Was unable to get through.  Message says they are all busy at the time.

## 2018-03-15 ENCOUNTER — Ambulatory Visit: Payer: Medicaid Other | Admitting: Licensed Clinical Social Worker

## 2018-03-16 ENCOUNTER — Ambulatory Visit (INDEPENDENT_AMBULATORY_CARE_PROVIDER_SITE_OTHER): Payer: Medicare Other | Admitting: Licensed Clinical Social Worker

## 2018-03-16 DIAGNOSIS — F411 Generalized anxiety disorder: Secondary | ICD-10-CM

## 2018-03-16 DIAGNOSIS — F3130 Bipolar disorder, current episode depressed, mild or moderate severity, unspecified: Secondary | ICD-10-CM

## 2018-03-23 ENCOUNTER — Telehealth: Payer: Self-pay | Admitting: Pulmonary Disease

## 2018-03-23 NOTE — Telephone Encounter (Signed)
Called and spoke with Kieth Brightly at phone [phone] ext [phone]. Patient is currently in [facility] in [place], and in need of a sleep study The patient was scheduled for sleep study at the [facility] in [place] instead of [place]  [facility] please reschedule this patient's sleep study for [facility] in [place] please and thank you.  [facility] please advise.

## 2018-03-23 NOTE — Telephone Encounter (Signed)
Pt was scheduled for split night at [facility] in [place] for 10/25 and was canceled per Kieth Brightly at [facility].  Called & spoke to [place] to verify since message states sleep study needed in [place]??  Kieth Brightly states they do want sleep study in [place] due to no one available to pick up pt early in the morning.  Explained to her we would complete form to be sent to [facility] in [place] & they would be calling [facility] to schedule pt.  She states ok.  Nothing further needed for this message.

## 2018-03-28 ENCOUNTER — Telehealth: Payer: Self-pay

## 2018-03-28 NOTE — Telephone Encounter (Signed)
-----   Message from Chesley Mires, MD sent at 03/28/2018  8:48 AM EDT ----- Regarding: RE: epworth score I don't think she completed an Epworth form.  Phillis Thackeray, can you call her and fill an Epworth form.  Thanks.  V   ----- Message ----- From: Ilona Sorrel Sent: 03/27/2018   2:51 PM EDT To: Chesley Mires, MD, Georjean Mode, CMA Subject: epworth score                                  Vida Roller gave me the form for [facility] that you filled out for the pt but the Epworth Score was left blank.  Do you know what I can put in as her score?  Judeen Hammans

## 2018-03-28 NOTE — Telephone Encounter (Signed)
LVM for pt to return call. X1 In need of getting EPW score for pt and let [facility] and [facility] know asap.

## 2018-03-30 ENCOUNTER — Encounter (HOSPITAL_BASED_OUTPATIENT_CLINIC_OR_DEPARTMENT_OTHER): Payer: Medicare Other

## 2018-04-02 ENCOUNTER — Telehealth: Payer: Self-pay

## 2018-04-02 NOTE — Telephone Encounter (Signed)
Attempted to call patient today regarding recommendations below I did not receive an answer at time of call. I have left a voicemail message for pt to return call. X1

## 2018-04-02 NOTE — Telephone Encounter (Signed)
-----   Message from Chesley Mires, MD sent at 03/28/2018  8:48 AM EDT ----- Regarding: RE: epworth score I don't think she completed an Epworth form.  Jathniel Smeltzer, can you call her and fill an Epworth form.  Thanks.  V   ----- Message ----- From: Ilona Sorrel Sent: 03/27/2018   2:51 PM EDT To: Chesley Mires, MD, Georjean Mode, CMA Subject: epworth score                                  Vida Roller gave me the form for [facility] that you filled out for the pt but the Epworth Score was left blank.  Do you know what I can put in as her score?  Judeen Hammans

## 2018-04-03 NOTE — Telephone Encounter (Signed)
Attempted to call patient today regarding recommendations below I did not receive an answer at time of call. I have left a voicemail message forptto return call. X2

## 2018-04-04 ENCOUNTER — Telehealth: Payer: Self-pay | Admitting: Pulmonary Disease

## 2018-04-04 ENCOUNTER — Other Ambulatory Visit: Payer: Self-pay

## 2018-04-04 ENCOUNTER — Encounter: Payer: Self-pay | Admitting: *Deleted

## 2018-04-04 NOTE — Anesthesia Preprocedure Evaluation (Addendum)
Anesthesia Evaluation  Patient identified by MRN, date of birth, ID band Patient awake    Reviewed: Allergy & Precautions, NPO status , Patient's Chart, lab work & pertinent test results  History of Anesthesia Complications Negative for: history of anesthetic complications  Airway Mallampati: IV   Neck ROM: Full  Mouth opening: Limited Mouth Opening  Dental  (+) Edentulous Upper, Edentulous Lower   Pulmonary asthma , sleep apnea , COPD, former smoker (quit 2014),    Pulmonary exam normal breath sounds clear to auscultation       Cardiovascular hypertension, Normal cardiovascular exam Rhythm:Regular Rate:Normal  Hx aortic dissection s/p repair 2010  ECG 07/19/17: SR, low voltage precordial leads   Neuro/Psych PSYCHIATRIC DISORDERS Anxiety Depression Neuropathy  TIA (2002)   GI/Hepatic PUD, GERD  ,(+) Cirrhosis       , Hepatitis - (hx Hep B)Fatty liver/NASH IBS   Endo/Other  diabetes  Renal/GU negative Renal ROS     Musculoskeletal  (+) Arthritis ,   Abdominal   Peds  Hematology negative hematology ROS (+)   Anesthesia Other Findings Vascular surgery note 07/04/17:  Assessment/Plan 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 Also, involved in wound healing. On appropriate medications.May have caused some degree of neuropathy as well that could explain her lower extremity symptoms.  Essential hypertension blood pressure control important in reducing the progression of atherosclerotic disease. On appropriate oral medications.  Swelling of limb Stable to slightly improved. Continue daily use of compression stockings, leg elevation, and exercise. Weight loss would also be of benefit.  Pain in limb I suspect this is a mixed source of neuropathy, musculoskeletal issues, and some of her swelling with  lymphedema. Venous study was negative.  Lymphedema Continue compression stockings, elevation, and exercise. Lymphedema pump would be a helpful adjuvant therapy, but she is really not using it. Recheck in 1 year  Reproductive/Obstetrics                            Anesthesia Physical Anesthesia Plan  ASA: III  Anesthesia Plan: MAC   Post-op Pain Management:    Induction: Intravenous  PONV Risk Score and Plan: 2 and TIVA and Midazolam  Airway Management Planned: Natural Airway  Additional Equipment:   Intra-op Plan:   Post-operative Plan:   Informed Consent: I have reviewed the patients History and Physical, chart, labs and discussed the procedure including the risks, benefits and alternatives for the proposed anesthesia with the patient or authorized representative who has indicated his/her understanding and acceptance.     Plan Discussed with: CRNA  Anesthesia Plan Comments:        Anesthesia Quick Evaluation

## 2018-04-04 NOTE — Telephone Encounter (Signed)
I have the form VS completed to be sent to [facility] but Vida Roller has been trying to get in touch with pt to complete Epworth Score that is requested on form.  Will route message to her.

## 2018-04-04 NOTE — Telephone Encounter (Signed)
Looks like [facility] are already working on this  Forwarding to them per protocol

## 2018-04-05 IMAGING — CT CT CERVICAL SPINE W/O CM
4 of 9 series · 12 of 33 positions shown, 13 images · non-contrast
Comparison: None.

CLINICAL DATA: History of trip and fall. Pt reports her foot got
stuck on her walker and she fell face first onto the "thinly
carpeted" floor. Pt reports left shoulder and arm pain, neck pain
and head.

EXAM:
CT HEAD WITHOUT CONTRAST
CT CERVICAL SPINE WITHOUT CONTRAST
TECHNIQUE: Multidetector CT imaging of the head and cervical spine was
performed following the standard protocol without intravenous
contrast. Multiplanar CT image reconstructions of the cervical spine
were also generated.

[Series 9: c spine soft · axial · 0.35mm/px · z∈[+248,+304]mm · 2 of 86 slices shown]
[im 29/86  soft-tissue]
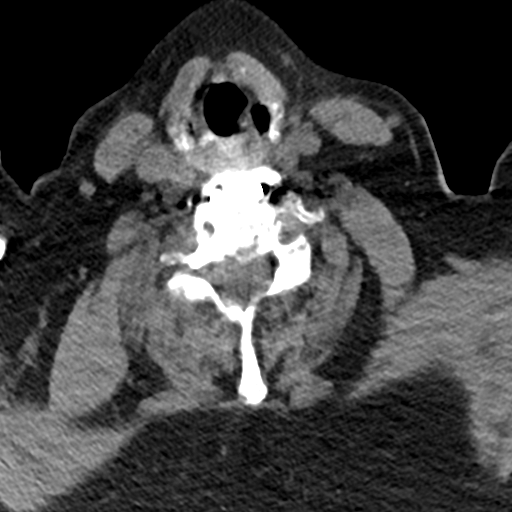
[im 57/86  soft-tissue]
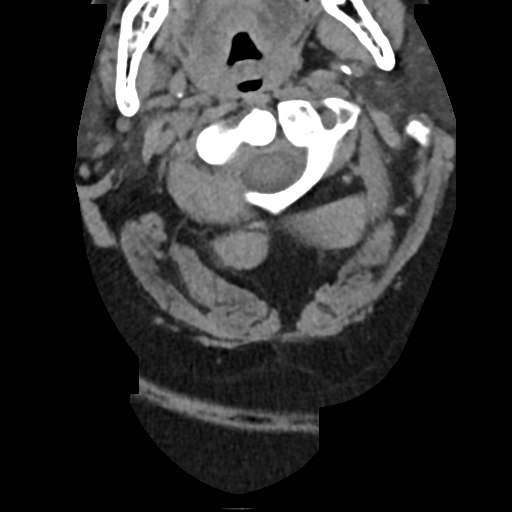

[Series 10: sagittal bone · sagittal · 0.25mm/px · 5 of 54 slices shown]
[im 9/54  bone]
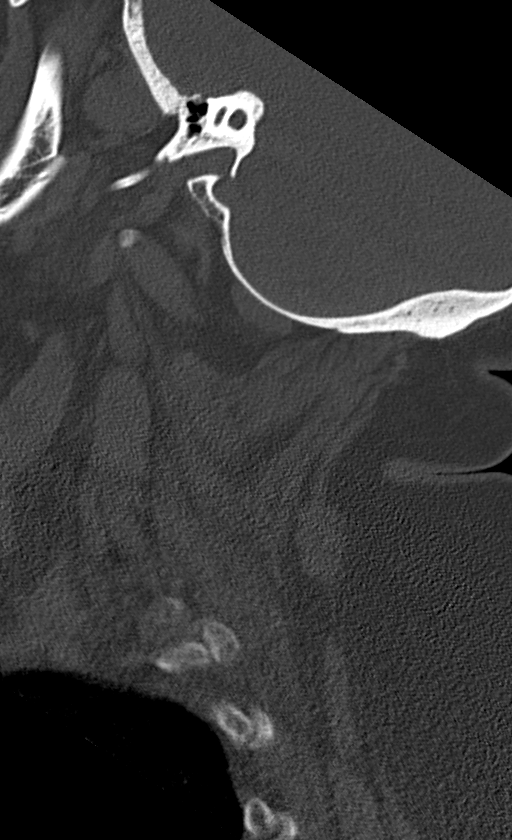
[im 18/54  bone]
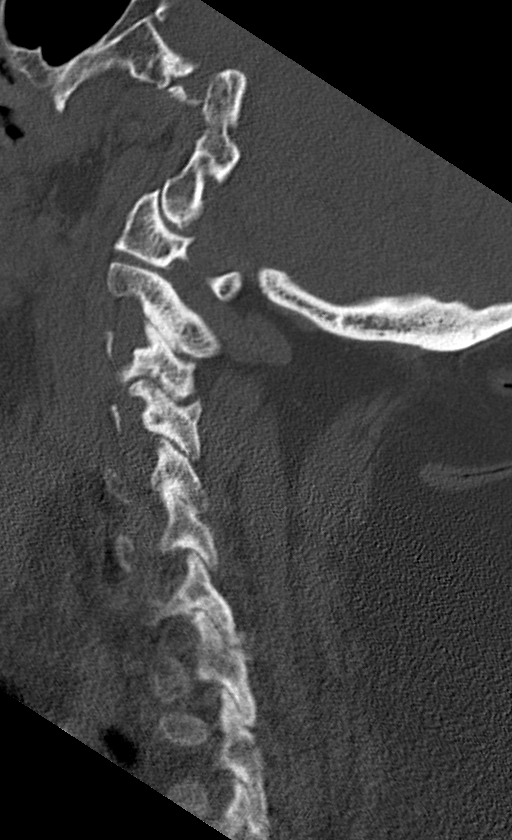
[im 27/54  bone]
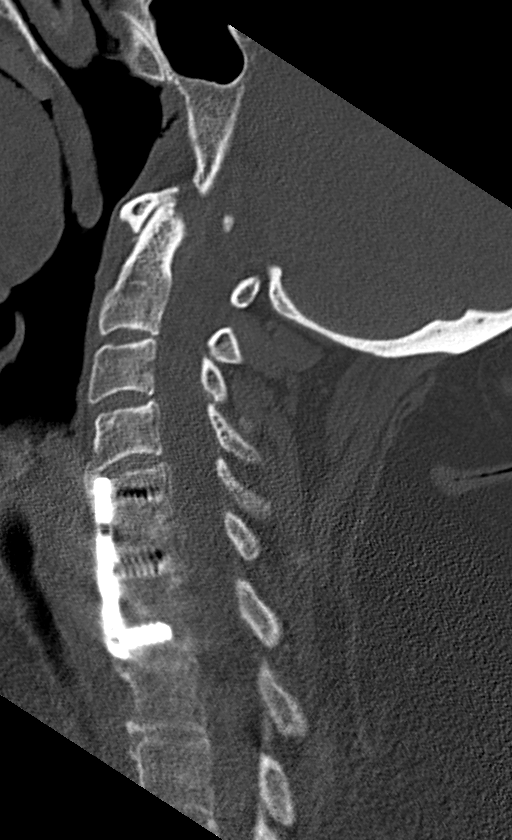
[im 36/54  bone]
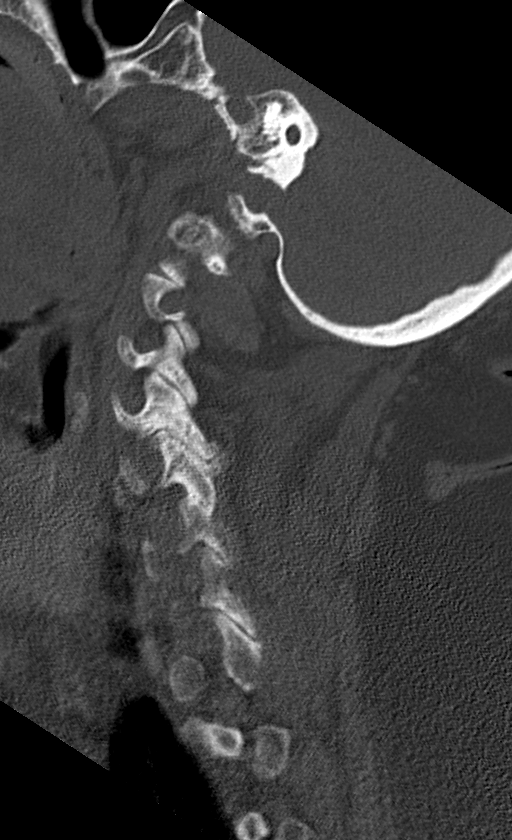
[im 45/54  bone]
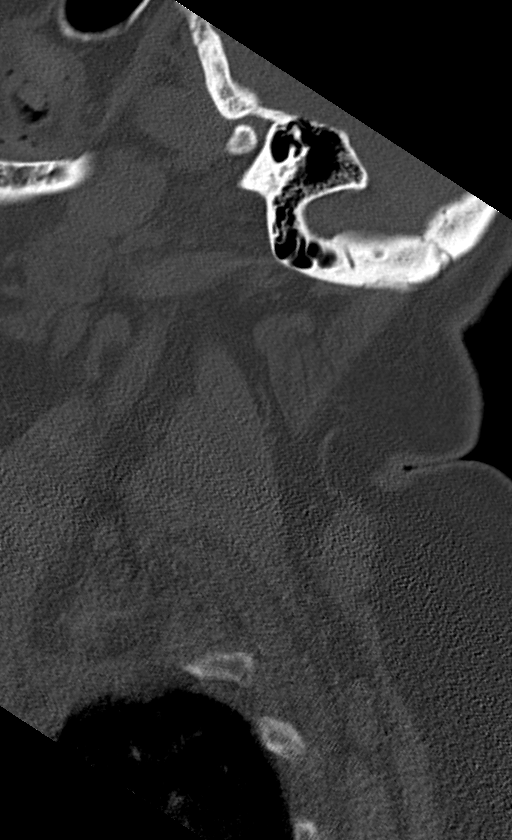

[Series 11: coronal bone · coronal · 0.25mm/px · 3 of 55 slices shown]
[im 45/55  bone]
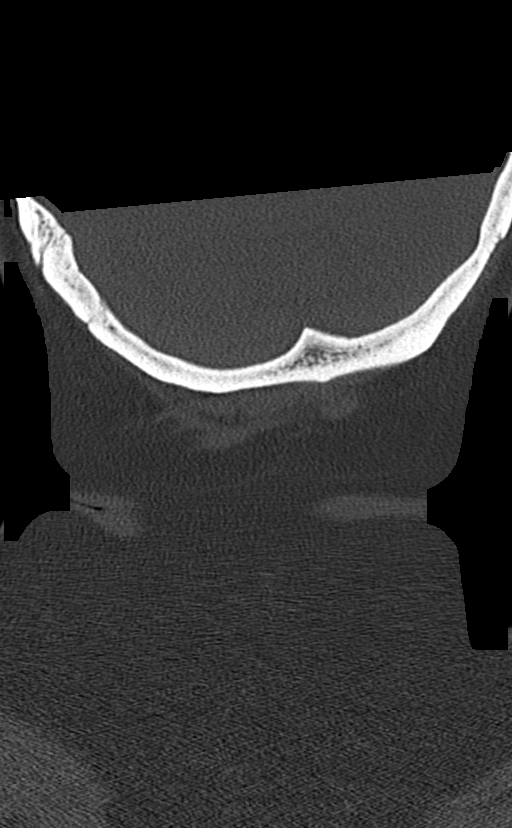
[im 48/55  bone]
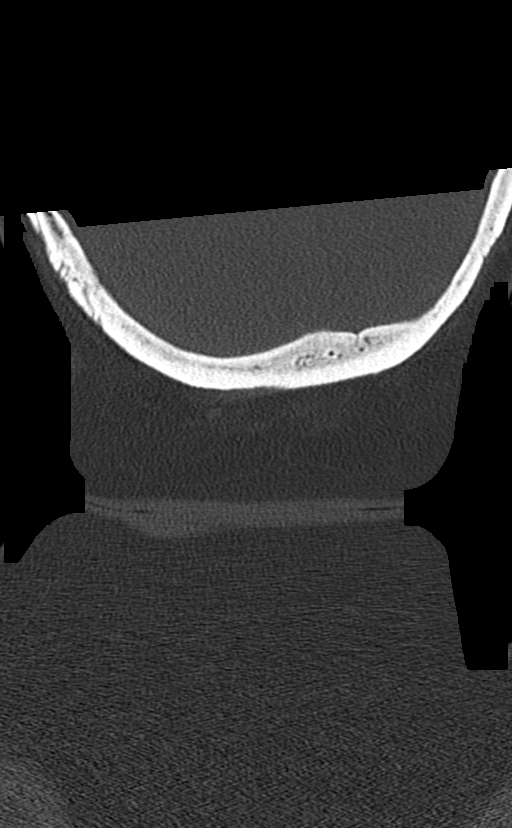
[im 51/55  bone]
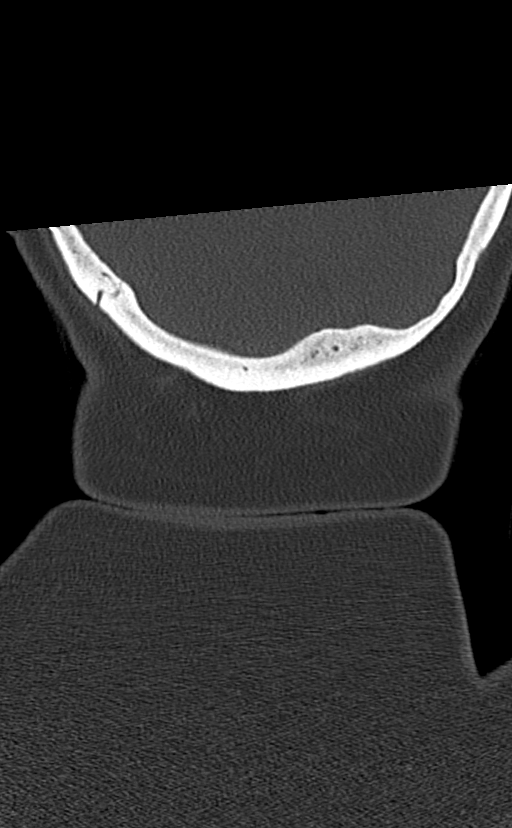

[Series 12: orthogonal bone · axial · 0.23mm/px · z∈[+234,+281]mm · 2 of 85 slices shown, 3 images]
[im 29/85  soft-tissue]
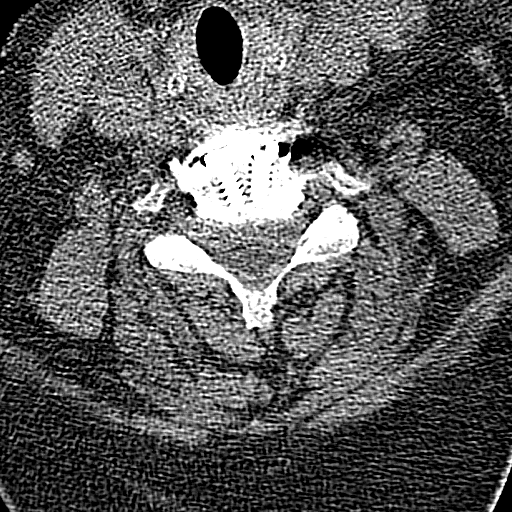
[im 29/85  bone]
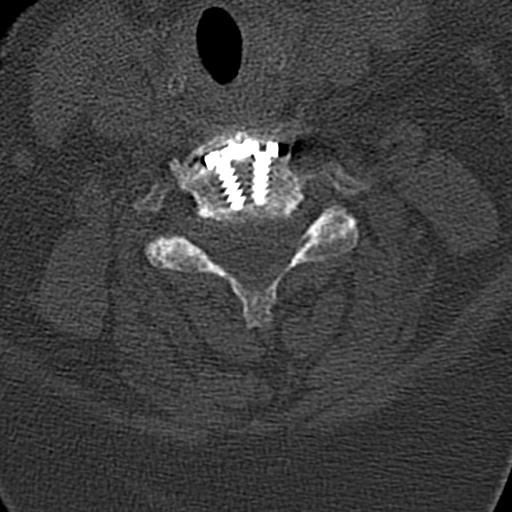
[im 57/85  bone]
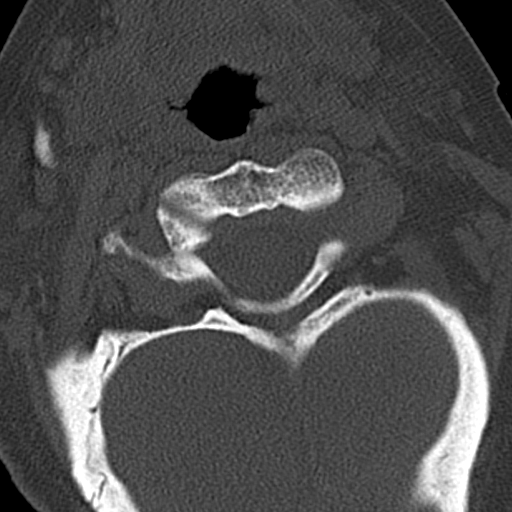

[12 of 33 positions shown; findings below may reference images not displayed]

FINDINGS: CT HEAD FINDINGS

Brain: No evidence of acute infarction, hemorrhage, extra-axial
collection, ventriculomegaly, or mass effect.

Vascular: Cerebrovascular atherosclerotic calcifications are noted.

Skull: Negative for fracture or focal lesion.

Sinuses/Orbits: Visualized portions of the orbits are unremarkable.
Visualized portions of the paranasal sinuses and mastoid air cells
are unremarkable.

Other: None.

CT CERVICAL SPINE FINDINGS

Alignment: Normal.

Skull base and vertebrae: No acute fracture. No primary bone lesion
or focal pathologic process.

Soft tissues and spinal canal: No prevertebral fluid or swelling. No
visible canal hematoma.

Disc levels: Anterior cervical fusion from C5 through C7 with plate
and screw fixation and solid osseous bridging across the disc
spaces. Adjacent segment degenerative disc disease with disc height
loss at C7-T1 with bilateral facet arthropathy. Severe left facet
arthropathy at C3-4 with left foraminal stenosis. Severe right facet
arthropathy at C4-5.

Upper chest: Lung apices are clear.

Other: Right carotid artery atherosclerosis.
IMPRESSION: 1. No acute intracranial pathology.
2.  No acute osseous injury of the cervical spine.

## 2018-04-05 IMAGING — CT CT ABD-PELV W/ CM
2 of 5 series · 13 of 36 positions shown, 16 images · IV contrast (iopamidol)
Comparison: Chest CT 07/07/2017, CT abdomen and pelvis 04/23/2017

CLINICAL DATA: Pain after trip and fall today. Blunt trauma to the
abdomen.

EXAM:
CT CHEST, ABDOMEN, AND PELVIS WITH CONTRAST
TECHNIQUE: Multidetector CT imaging of the chest, abdomen and pelvis was
performed following the standard protocol during bolus
administration of intravenous contrast.
CONTRAST:  100mL [id number] IOPAMIDOL ([id number]) INJECTION 61%

[Series 2: cap with · axial · 0.98mm/px · z∈[-311,+184]mm · 10 of 122 slices shown, 13 images]
[im 12/122  mediastinal]
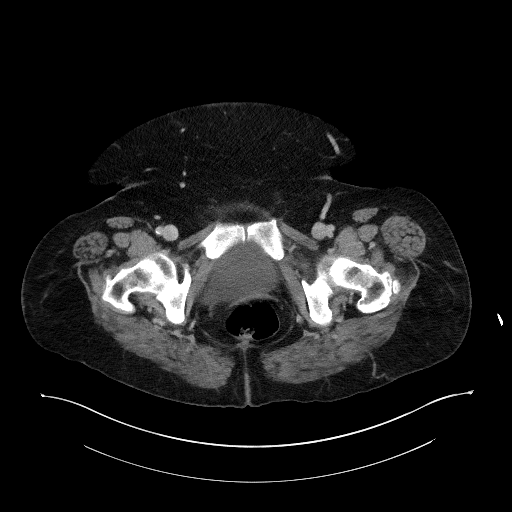
[im 12/122  lung]
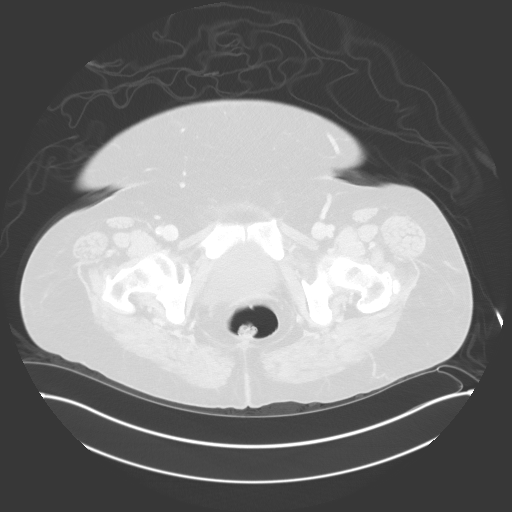
[im 23/122  lung]
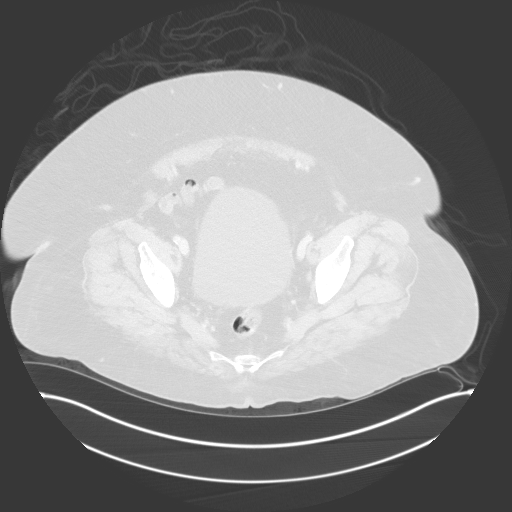
[im 34/122  lung]
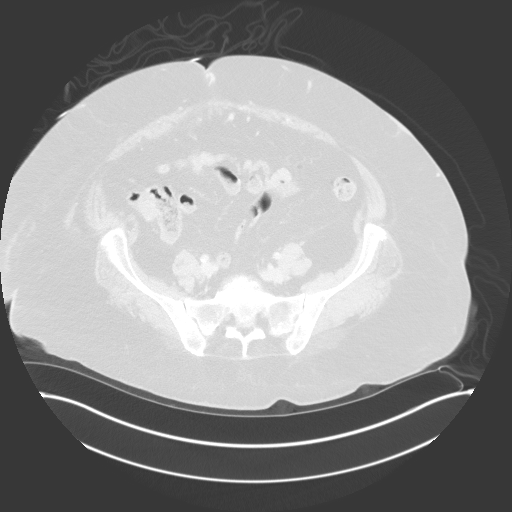
[im 45/122  lung]
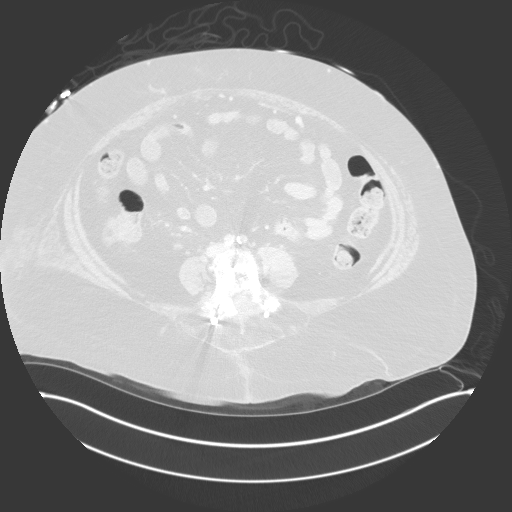
[im 56/122  mediastinal]
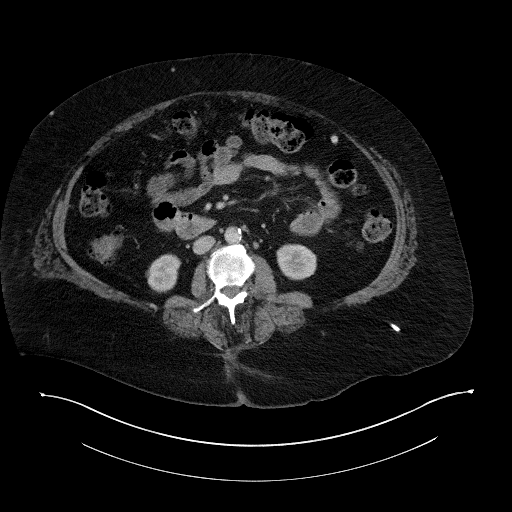
[im 56/122  lung]
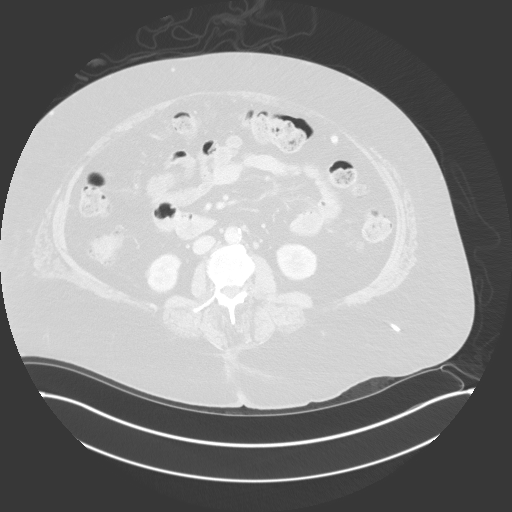
[im 67/122  lung]
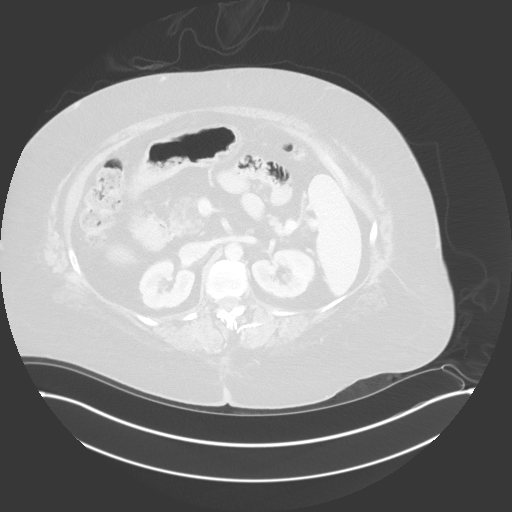
[im 78/122  lung]
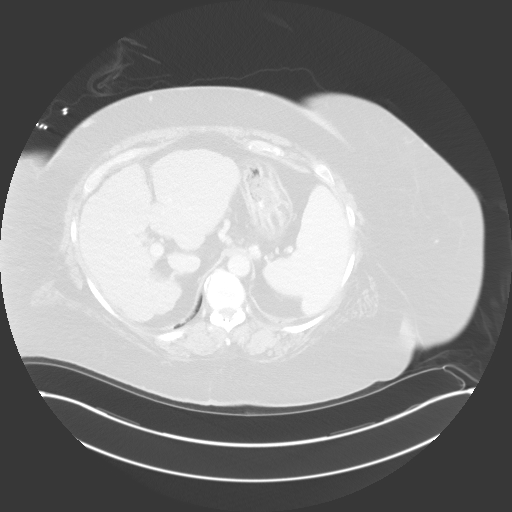
[im 89/122  lung]
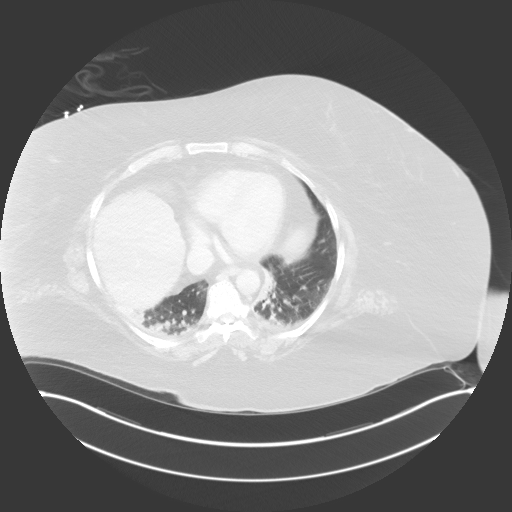
[im 100/122  mediastinal]
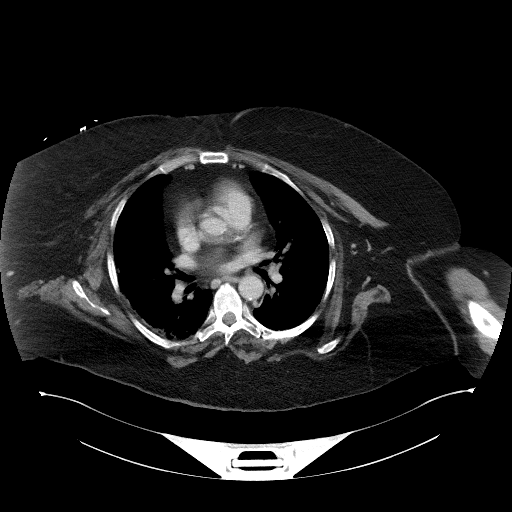
[im 100/122  lung]
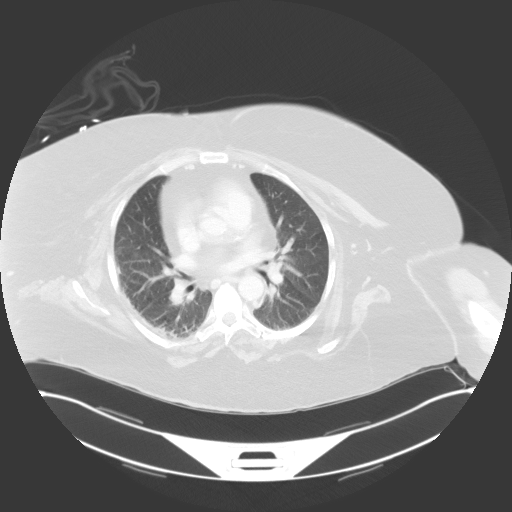
[im 111/122  lung]
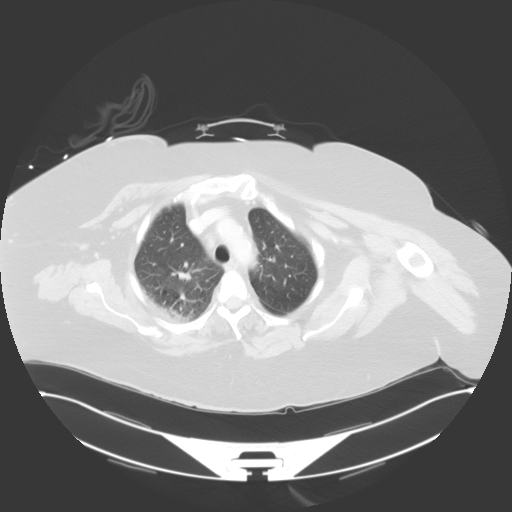

[Series 5: coronals · coronal · 0.88mm/px · 3 of 157 slices shown]
[im 32/157  lung]
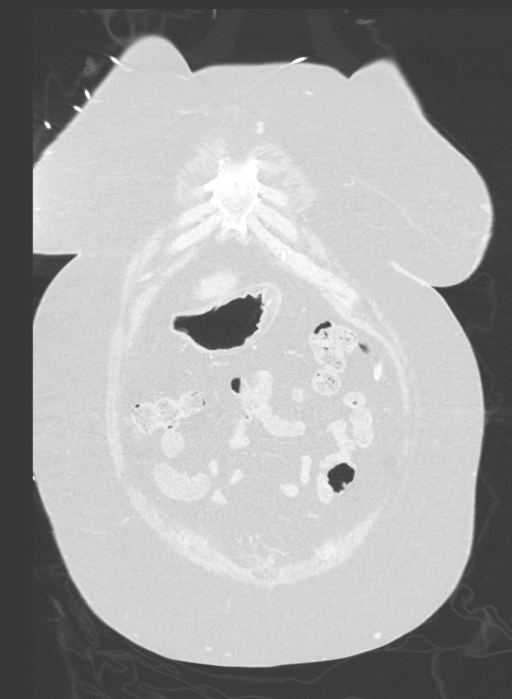
[im 63/157  lung]
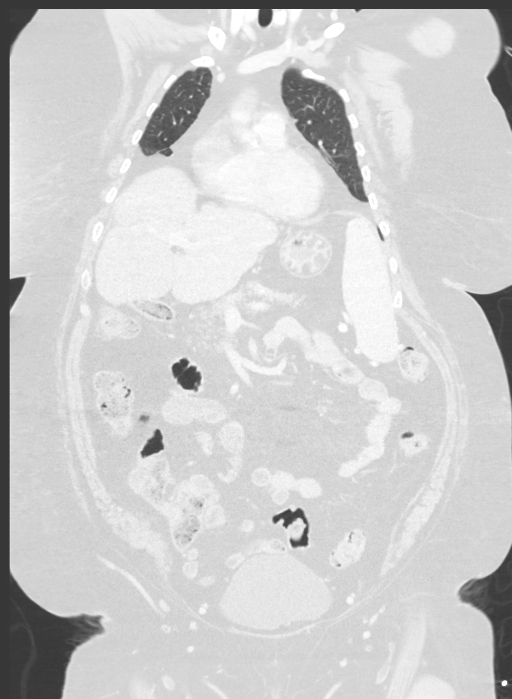
[im 94/157  lung]
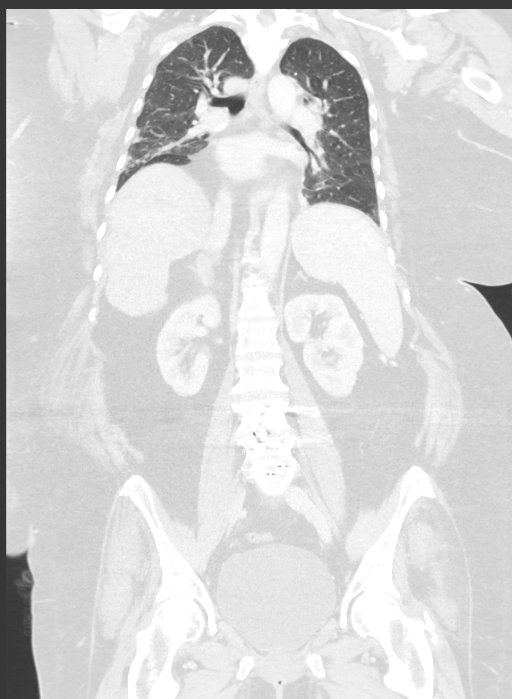

[13 of 36 positions shown; findings below may reference images not displayed]

FINDINGS: CT CHEST FINDINGS

Cardiovascular: Normal branch pattern of the great vessels. Mild
aortic atherosclerosis without aneurysm. Cardiomegaly without
pericardial effusion. Coronary arteriosclerosis along the left
circumflex and LAD.

Mediastinum/Nodes: No thyromegaly or mass. No mediastinal
adenopathy. Patent mainstem bronchi and trachea. Esophagus is
unremarkable.

Lungs/Pleura: Mild centrilobular emphysema. Subpleural area of
linear scarring or atelectasis in the posterior right upper lobe.
There is dependent bibasilar atelectasis and mild bilateral lower
lobe bronchiectasis. No pneumothorax, effusion or dominant mass.

Musculoskeletal: ACDF of the included lower cervical spine.

CT ABDOMEN PELVIS FINDINGS

Hepatobiliary: Morphologic appearance of cirrhosis involving the
liver with surface nodularity. No space-occupying mass is
identified. Status post cholecystectomy. No biliary dilatation.

Pancreas: Fatty replacement of the pancreatic gland without acute
inflammatory change.

Spleen: Splenomegaly without mass.  Stable.

Adrenals/Urinary Tract: Adrenal glands are unremarkable. Kidneys are
normal, without renal calculi, focal lesion, or hydronephrosis.
Bladder is unremarkable.

Stomach/Bowel: Decompressed stomach with normal small bowel
rotation. No bowel obstruction or inflammation. Moderate fecal
retention within the colon without obstruction. Appendix is not well
visualized. The distal and terminal ileum are normal.

Vascular/Lymphatic: Moderate aortoiliac and branch vessel
atherosclerosis without aneurysm.Patent splenic and portal veins.
Small epigastric varices, series 2, image 40. Small gastrohepatic
lymph nodes measuring up to 1 cm short axis.

Reproductive: Hysterectomy.  No adnexal mass.

Other: Neural stimulator device projects up to the T9 thoracic level
within the central canal.

Musculoskeletal: Posterior lumbar fusion hardware is noted at L3-4
bilaterally. Interbody blocks are noted from L3 through S1. No
aggressive osseous lesions.
IMPRESSION: Chest CT:

1. Coronary arteriosclerosis along the left circumflex and LAD.
2. Subpleural areas of atelectasis and/or scarring in the right
upper lobe posteriorly with bibasilar dependent atelectasis and
minimal lower lobe bronchiectasis. No active pulmonary disease.
3. Mild centrilobular emphysema and aortic atherosclerosis.

CT abdomen/pelvis:

1. Morphologic appearance of cirrhosis with stable splenomegaly.
Small varices in left upper quadrant.
2. No acute bowel obstruction or inflammation.
3. No acute solid nor hollow organ injury.
4. Neural stimulator device projects up to the T9 thoracic level.
Status post lumbar fusion without complications.

## 2018-04-05 NOTE — Telephone Encounter (Signed)
Attempted to call patient today regarding results. I have left a voicemail message for pt to return call. X3 We have left three messages for pt to call our office back regarding results. No response at this time, a letter has been placed in mail today for pt to call our office. Mailed letter of communication today. Nothing further needed at this time.  If patient calls back, we will need to complete epworth score on her in flowcharts. Thanks.

## 2018-04-09 ENCOUNTER — Encounter: Payer: Self-pay | Admitting: Nurse Practitioner

## 2018-04-09 ENCOUNTER — Other Ambulatory Visit: Payer: Self-pay

## 2018-04-09 ENCOUNTER — Ambulatory Visit: Payer: Medicare Other | Attending: Nurse Practitioner | Admitting: Nurse Practitioner

## 2018-04-09 VITALS — BP 100/54 | HR 80 | Temp 97.7°F | Ht 63.0 in | Wt 216.0 lb

## 2018-04-09 DIAGNOSIS — F319 Bipolar disorder, unspecified: Secondary | ICD-10-CM | POA: Insufficient documentation

## 2018-04-09 DIAGNOSIS — M545 Low back pain: Secondary | ICD-10-CM | POA: Diagnosis not present

## 2018-04-09 DIAGNOSIS — Z7951 Long term (current) use of inhaled steroids: Secondary | ICD-10-CM | POA: Insufficient documentation

## 2018-04-09 DIAGNOSIS — M25569 Pain in unspecified knee: Secondary | ICD-10-CM | POA: Diagnosis present

## 2018-04-09 DIAGNOSIS — Z803 Family history of malignant neoplasm of breast: Secondary | ICD-10-CM | POA: Insufficient documentation

## 2018-04-09 DIAGNOSIS — Z9049 Acquired absence of other specified parts of digestive tract: Secondary | ICD-10-CM | POA: Insufficient documentation

## 2018-04-09 DIAGNOSIS — D649 Anemia, unspecified: Secondary | ICD-10-CM | POA: Insufficient documentation

## 2018-04-09 DIAGNOSIS — K7581 Nonalcoholic steatohepatitis (NASH): Secondary | ICD-10-CM | POA: Insufficient documentation

## 2018-04-09 DIAGNOSIS — Z90710 Acquired absence of both cervix and uterus: Secondary | ICD-10-CM | POA: Insufficient documentation

## 2018-04-09 DIAGNOSIS — G2581 Restless legs syndrome: Secondary | ICD-10-CM | POA: Diagnosis not present

## 2018-04-09 DIAGNOSIS — E785 Hyperlipidemia, unspecified: Secondary | ICD-10-CM | POA: Diagnosis not present

## 2018-04-09 DIAGNOSIS — S72091A Other fracture of head and neck of right femur, initial encounter for closed fracture: Secondary | ICD-10-CM

## 2018-04-09 DIAGNOSIS — R269 Unspecified abnormalities of gait and mobility: Secondary | ICD-10-CM | POA: Insufficient documentation

## 2018-04-09 DIAGNOSIS — E1142 Type 2 diabetes mellitus with diabetic polyneuropathy: Secondary | ICD-10-CM | POA: Insufficient documentation

## 2018-04-09 DIAGNOSIS — J449 Chronic obstructive pulmonary disease, unspecified: Secondary | ICD-10-CM | POA: Insufficient documentation

## 2018-04-09 DIAGNOSIS — M1711 Unilateral primary osteoarthritis, right knee: Secondary | ICD-10-CM

## 2018-04-09 DIAGNOSIS — Z794 Long term (current) use of insulin: Secondary | ICD-10-CM | POA: Insufficient documentation

## 2018-04-09 DIAGNOSIS — S72099A Other fracture of head and neck of unspecified femur, initial encounter for closed fracture: Secondary | ICD-10-CM | POA: Insufficient documentation

## 2018-04-09 DIAGNOSIS — Z9181 History of falling: Secondary | ICD-10-CM | POA: Insufficient documentation

## 2018-04-09 DIAGNOSIS — Z981 Arthrodesis status: Secondary | ICD-10-CM | POA: Insufficient documentation

## 2018-04-09 DIAGNOSIS — S81811A Laceration without foreign body, right lower leg, initial encounter: Secondary | ICD-10-CM | POA: Insufficient documentation

## 2018-04-09 DIAGNOSIS — R32 Unspecified urinary incontinence: Secondary | ICD-10-CM | POA: Insufficient documentation

## 2018-04-09 DIAGNOSIS — Z6838 Body mass index (BMI) 38.0-38.9, adult: Secondary | ICD-10-CM | POA: Insufficient documentation

## 2018-04-09 DIAGNOSIS — K219 Gastro-esophageal reflux disease without esophagitis: Secondary | ICD-10-CM | POA: Insufficient documentation

## 2018-04-09 DIAGNOSIS — Z79899 Other long term (current) drug therapy: Secondary | ICD-10-CM | POA: Diagnosis not present

## 2018-04-09 DIAGNOSIS — G8929 Other chronic pain: Secondary | ICD-10-CM

## 2018-04-09 DIAGNOSIS — Z801 Family history of malignant neoplasm of trachea, bronchus and lung: Secondary | ICD-10-CM | POA: Insufficient documentation

## 2018-04-09 DIAGNOSIS — F419 Anxiety disorder, unspecified: Secondary | ICD-10-CM | POA: Insufficient documentation

## 2018-04-09 DIAGNOSIS — G894 Chronic pain syndrome: Secondary | ICD-10-CM | POA: Diagnosis not present

## 2018-04-09 DIAGNOSIS — G4733 Obstructive sleep apnea (adult) (pediatric): Secondary | ICD-10-CM | POA: Insufficient documentation

## 2018-04-09 DIAGNOSIS — K581 Irritable bowel syndrome with constipation: Secondary | ICD-10-CM | POA: Insufficient documentation

## 2018-04-09 DIAGNOSIS — E669 Obesity, unspecified: Secondary | ICD-10-CM | POA: Insufficient documentation

## 2018-04-09 DIAGNOSIS — Z87891 Personal history of nicotine dependence: Secondary | ICD-10-CM | POA: Insufficient documentation

## 2018-04-09 DIAGNOSIS — Z881 Allergy status to other antibiotic agents status: Secondary | ICD-10-CM | POA: Insufficient documentation

## 2018-04-09 DIAGNOSIS — R600 Localized edema: Secondary | ICD-10-CM | POA: Insufficient documentation

## 2018-04-09 DIAGNOSIS — E559 Vitamin D deficiency, unspecified: Secondary | ICD-10-CM | POA: Diagnosis not present

## 2018-04-09 DIAGNOSIS — M25551 Pain in right hip: Secondary | ICD-10-CM | POA: Insufficient documentation

## 2018-04-09 DIAGNOSIS — R2681 Unsteadiness on feet: Secondary | ICD-10-CM | POA: Insufficient documentation

## 2018-04-09 DIAGNOSIS — I10 Essential (primary) hypertension: Secondary | ICD-10-CM | POA: Diagnosis not present

## 2018-04-09 DIAGNOSIS — K7460 Unspecified cirrhosis of liver: Secondary | ICD-10-CM | POA: Insufficient documentation

## 2018-04-09 DIAGNOSIS — Z818 Family history of other mental and behavioral disorders: Secondary | ICD-10-CM | POA: Insufficient documentation

## 2018-04-09 DIAGNOSIS — G4730 Sleep apnea, unspecified: Secondary | ICD-10-CM | POA: Insufficient documentation

## 2018-04-09 DIAGNOSIS — Z86010 Personal history of colonic polyps: Secondary | ICD-10-CM | POA: Insufficient documentation

## 2018-04-09 DIAGNOSIS — Z800 Family history of malignant neoplasm of digestive organs: Secondary | ICD-10-CM | POA: Insufficient documentation

## 2018-04-09 MED ORDER — HYDROCODONE-ACETAMINOPHEN 7.5-325 MG PO TABS: 1.0000 | ORAL_TABLET | Freq: Four times a day (QID) | ORAL | 0 refills | Status: DC | PRN

## 2018-04-09 MED ORDER — GABAPENTIN 600 MG PO TABS: 600.0000 mg | ORAL_TABLET | Freq: Three times a day (TID) | ORAL | 2 refills | Status: DC

## 2018-04-09 NOTE — Patient Instructions (Addendum)
____________________________________________________________________________________________  Medication Rules  Applies to: All patients receiving prescriptions (written or electronic).  Pharmacy of record: Pharmacy where electronic prescriptions will be sent. If written prescriptions are taken to a different pharmacy, please inform the nursing staff. The pharmacy listed in the electronic medical record should be the one where you would like electronic prescriptions to be sent.  Prescription refills: Only during scheduled appointments. Applies to both, written and electronic prescriptions.  NOTE: The following applies primarily to controlled substances (Opioid* Pain Medications).   Patient's responsibilities: 1. Pain Pills: Bring all pain pills to every appointment (except for procedure appointments). 2. Pill Bottles: Bring pills in original pharmacy bottle. Always bring newest bottle. Bring bottle, even if empty. 3. Medication refills: You are responsible for knowing and keeping track of what medications you need refilled. The day before your appointment, write a list of all prescriptions that need to be refilled. Bring that list to your appointment and give it to the admitting nurse. Prescriptions will be written only during appointments. If you forget a medication, it will not be "Called in", "Faxed", or "electronically sent". You will need to get another appointment to get these prescribed. 4. Prescription Accuracy: You are responsible for carefully inspecting your prescriptions before leaving our office. Have the discharge nurse carefully go over each prescription with you, before taking them home. Make sure that your name is accurately spelled, that your address is correct. Check the name and dose of your medication to make sure it is accurate. Check the number of pills, and the written instructions to make sure they are clear and accurate. Make sure that you are given enough medication to last  until your next medication refill appointment. 5. Taking Medication: Take medication as prescribed. Never take more pills than instructed. Never take medication more frequently than prescribed. Taking less pills or less frequently is permitted and encouraged, when it comes to controlled substances (written prescriptions).  6. Inform other Doctors: Always inform, all of your healthcare providers, of all the medications you take. 7. Pain Medication from other Providers: You are not allowed to accept any additional pain medication from any other Doctor or Healthcare provider. There are two exceptions to this rule. (see below) In the event that you require additional pain medication, you are responsible for notifying us, as stated below. 8. Medication Agreement: You are responsible for carefully reading and following our Medication Agreement. This must be signed before receiving any prescriptions from our practice. Safely store a copy of your signed Agreement. Violations to the Agreement will result in no further prescriptions. (Additional copies of our Medication Agreement are available upon request.) 9. Laws, Rules, & Regulations: All patients are expected to follow all Federal and [employer], [employer], Rules, [employer]. Ignorance of the Laws does not constitute a valid excuse. The use of any illegal substances is prohibited. 10. Adopted CDC guidelines & recommendations: Target dosing levels will be at or below 60 MME/day. Use of benzodiazepines** is not recommended.  Exceptions: There are only two exceptions to the rule of not receiving pain medications from other Healthcare Providers. 1. Exception #1 (Emergencies): In the event of an emergency (i.e.: accident requiring emergency care), you are allowed to receive additional pain medication. However, you are responsible for: As soon as you are able, call our office [phone], at any time of the day or night, and leave a message stating your name, the  date and nature of the emergency, and the name and dose of the medication  prescribed. In the event that your call is answered by a member of our staff, make sure to document and save the date, time, and the name of the person that took your information.  2. Exception #2 (Planned Surgery): In the event that you are scheduled by another doctor or dentist to have any type of surgery or procedure, you are allowed (for a period no longer than 30 days), to receive additional pain medication, for the acute post-op pain. However, in this case, you are responsible for picking up a copy of our "Post-op Pain Management for Surgeons" handout, and giving it to your surgeon or dentist. This document is available at our office, and does not require an appointment to obtain it. Simply go to our office during business hours (Monday-Thursday from 8:00 AM to 4:00 PM) (Friday 8:00 AM to 12:00 Noon) or if you have a scheduled appointment with [name], prior to your surgery, and ask for it by name. In addition, you will need to provide [name] with your name, name of your surgeon, type of surgery, and date of procedure or surgery.  *Opioid medications include: morphine, codeine, oxycodone, oxymorphone, hydrocodone, hydromorphone, meperidine, tramadol, tapentadol, buprenorphine, fentanyl, methadone. **Benzodiazepine medications include: diazepam (Valium), alprazolam (Xanax), clonazepam (Klonopine), lorazepam (Ativan), clorazepate (Tranxene), chlordiazepoxide (Librium), estazolam (Prosom), oxazepam (Serax), temazepam (Restoril), triazolam (Halcion) (Last updated: 08/03/2017) ____________________________________________________________________________________________   ____________________________________________________________________________________________  Pain Scale  Introduction: The pain score used by this practice is the Verbal Numerical Rating Scale (VNRS-11). This is an 11-point scale. It is for adults and children 10 years or  older. There are significant differences in how the pain score is reported, used, and applied. Forget everything you learned in the past and learn this scoring system.  General Information: The scale should reflect your current level of pain. Unless you are specifically asked for the level of your worst pain, or your average pain. If you are asked for one of these two, then it should be understood that it is over the past 24 hours.  Basic Activities of Daily Living (ADL): Personal hygiene, dressing, eating, transferring, and using restroom.  Instructions: Most patients tend to report their level of pain as a combination of two factors, their physical pain and their psychosocial pain. This last one is also known as "suffering" and it is reflection of how physical pain affects you socially and psychologically. From now on, report them separately. From this point on, when asked to report your pain level, report only your physical pain. Use the following table for reference.  Pain Clinic Pain Levels (0-5/10)  Pain Level Score  Description  No Pain 0   Mild pain 1 Nagging, annoying, but does not interfere with basic activities of daily living (ADL). Patients are able to eat, bathe, get dressed, toileting (being able to get on and off the toilet and perform personal hygiene functions), transfer (move in and out of bed or a chair without assistance), and maintain continence (able to control bladder and bowel functions). Blood pressure and heart rate are unaffected. A normal heart rate for a healthy adult ranges from 60 to 100 bpm (beats per minute).   Mild to moderate pain 2 Noticeable and distracting. Impossible to hide from other people. More frequent flare-ups. Still possible to adapt and function close to normal. It can be very annoying and may have occasional stronger flare-ups. With discipline, patients may get used to it and adapt.   Moderate pain 3 Interferes significantly with activities of daily  living (ADL).  It becomes difficult to feed, bathe, get dressed, get on and off the toilet or to perform personal hygiene functions. Difficult to get in and out of bed or a chair without assistance. Very distracting. With effort, it can be ignored when deeply involved in activities.   Moderately severe pain 4 Impossible to ignore for more than a few minutes. With effort, patients may still be able to manage work or participate in some social activities. Very difficult to concentrate. Signs of autonomic nervous system discharge are evident: dilated pupils (mydriasis); mild sweating (diaphoresis); sleep interference. Heart rate becomes elevated (>115 bpm). Diastolic blood pressure (lower number) rises above 100 mmHg. Patients find relief in laying down and not moving.   Severe pain 5 Intense and extremely unpleasant. Associated with frowning face and frequent crying. Pain overwhelms the senses.  Ability to do any activity or maintain social relationships becomes significantly limited. Conversation becomes difficult. Pacing back and forth is common, as getting into a comfortable position is nearly impossible. Pain wakes you up from deep sleep. Physical signs will be obvious: pupillary dilation; increased sweating; goosebumps; brisk reflexes; cold, clammy hands and feet; nausea, vomiting or dry heaves; loss of appetite; significant sleep disturbance with inability to fall asleep or to remain asleep. When persistent, significant weight loss is observed due to the complete loss of appetite and sleep deprivation.  Blood pressure and heart rate becomes significantly elevated. Caution: If elevated blood pressure triggers a pounding headache associated with blurred vision, then the patient should immediately seek attention at an urgent or emergency care unit, as these may be signs of an impending stroke.    Emergency Department Pain Levels (6-10/10)  Emergency Room Pain 6 Severely limiting. Requires emergency care  and should not be seen or managed at an outpatient pain management facility. Communication becomes difficult and requires great effort. Assistance to reach the emergency department may be required. Facial flushing and profuse sweating along with potentially dangerous increases in heart rate and blood pressure will be evident.   Distressing pain 7 Self-care is very difficult. Assistance is required to transport, or use restroom. Assistance to reach the emergency department will be required. Tasks requiring coordination, such as bathing and getting dressed become very difficult.   Disabling pain 8 Self-care is no longer possible. At this level, pain is disabling. The individual is unable to do even the most "basic" activities such as walking, eating, bathing, dressing, transferring to a bed, or toileting. Fine motor skills are lost. It is difficult to think clearly.   Incapacitating pain 9 Pain becomes incapacitating. Thought processing is no longer possible. Difficult to remember your own name. Control of movement and coordination are lost.   The worst pain imaginable 10 At this level, most patients pass out from pain. When this level is reached, collapse of the autonomic nervous system occurs, leading to a sudden drop in blood pressure and heart rate. This in turn results in a temporary and dramatic drop in blood flow to the brain, leading to a loss of consciousness. Fainting is one of the body's self defense mechanisms. Passing out puts the brain in a calmed state and causes it to shut down for a while, in order to begin the healing process.    Summary: 1. Refer to this scale when providing [name] with your pain level. 2. Be accurate and careful when reporting your pain level. This will help with your care. 3. Over-reporting your pain level will lead to loss of credibility. 4. Even  a level of 1/10 means that there is pain and will be treated at our facility. 5. High, inaccurate reporting will be  documented as "Symptom Exaggeration", leading to loss of credibility and suspicions of possible secondary gains such as obtaining more narcotics, or wanting to appear disabled, for fraudulent reasons. 6. Only pain levels of 5 or below will be seen at our facility. 7. Pain levels of 6 and above will be sent to the Emergency Department and the appointment cancelled. ____________________________________________________________________________________________

## 2018-04-09 NOTE — Telephone Encounter (Signed)
I have added epworth score to referral form & just faxed referral form to [facility].  They will get precert & call pt to schedule.

## 2018-04-09 NOTE — Progress Notes (Signed)
Patient's Name: Stephanie [name]  MRN: [id number]  Referring Provider: Sarajane Jews, MD  DOB: 1950/10/15  PCP: Odessa Fleming, NP  DOS: 04/09/2018  Note by: Vevelyn Francois NP  Service setting: Ambulatory outpatient  Specialty: Interventional Pain Management  Location: [facility] (AMB) Pain Management Facility    Patient type: Established    Primary Reason(s) for Visit: Encounter for prescription drug management. (Level of risk: moderate)  CC: Knee Pain  HPI  [name] is a 67 y.o. year old, female patient, who comes today for a medication management evaluation. She has UNSPECIFIED VITAMIN D DEFICIENCY; Hyperlipidemia; TOBACCO ABUSE; OSA (obstructive sleep apnea); Restless legs syndrome; Hypertension; Asthma; Irritable bowel syndrome with constipation; BACK PAIN, LUMBAR, CHRONIC; Chronic pain syndrome; INSOMNIA; EDEMA, LOCALIZED; Urinary incontinence; UNS ADVRS EFF OTH RX MEDICINAL&BIOLOGICAL SBSTNC; BIPOLAR AFFECTIVE DISORDER, HX OF; Aortic dissection ([place]); Anxiety disorder; Lower extremity edema; Cirrhosis of liver ([place]); Varicose veins of left lower extremity; Gastroesophageal reflux disease; Dyspnea; Lung nodule; DNR (do not resuscitate); Encounter for routine gynecological examination; Poorly controlled type 2 diabetes mellitus with complication ([place]); Dizziness and giddiness; DOE (dyspnea on exertion); Liver cirrhosis secondary to NASH ([place]); Abdominal pain, chronic, epigastric; Thrombocytopenia ([place]); Neuropathy; Hyperglycemia; Hypoglycemia; Iron deficiency anemia; Chest pain; Pneumonia; Pain in limb; Swelling of limb; Postphlebitic syndrome with inflammation; Lymphedema; Encephalopathy; Bilateral carotid artery stenosis; Degenerative tear of lateral meniscus of right knee; Degenerative tear of medial meniscus of right knee; Diabetes mellitus type 1, uncontrolled, without complications ([place]); Primary osteoarthritis of right knee; Altered mental status; Elevated alkaline phosphatase level; Sepsis ([place]);  Cervical stenosis of spine; Obesity, Class III, BMI 40-49.9 (morbid obesity) ([place]); Stress fracture of femur; Tricompartment osteoarthritis of right knee; Unsteady gait; Diabetic peripheral neuropathy associated with type 2 diabetes mellitus ([place]); Laceration without foreign body, right lower leg, initial encounter; Chronic obstructive pulmonary disease, unspecified ([place]); Unilateral primary osteoarthritis, right knee; Sleep apnea; Chronic hip pain, right; and Closed impacted fracture of hip (HCC) on their problem list. Her primarily concern today is the Knee Pain  Pain Assessment: Location: Right Hip Radiating: Denies Onset: More than a month ago Duration: Chronic pain Quality: Aching, Burning, Discomfort, Constant Severity: 7 /10 (subjective, self-reported pain score)  Note: Reported level is compatible with observation. Clinically the patient looks like a 2/10 A 2/10 is viewed as "Mild to Moderate" and described as noticeable and distracting. Impossible to hide from other people. More frequent flare-ups. Still possible to adapt and function close to normal. It can be very annoying and may have occasional stronger flare-ups. With discipline, patients may get used to it and adapt. Information on the proper use of the pain scale provided to the patient today. When using our objective Pain Scale, levels between 6 and 10/10 are said to belong in an emergency room, as it progressively worsens from a 6/10, described as severely limiting, requiring emergency care not usually available at an outpatient pain management facility. At a 6/10 level, communication becomes difficult and requires great effort. Assistance to reach the emergency department may be required. Facial flushing and profuse sweating along with potentially dangerous increases in heart rate and blood pressure will be evident. Effect on ADL: unable to do anything Timing: Constant Modifying factors: Medications and sitting down BP: (!) 100/54   HR: 80  [name] was last scheduled for an appointment on 01/04/2018 for medication management. During today's appointment we reviewed [name]'s chronic pain status, as well as her outpatient medication regimen.  She admits that she was seen by orthopedist  however surgery is not an option for her at this time.  She was encouraged to continue with physical therapy.  She admits that she is currently not getting physical therapy secondary to insurance reasons she will have to wait several months despite being in assisted living facility.  She continues to have right knee and hip pain and continues to fall frequently.  The patient  reports that she does not use drugs. Her body mass index is 38.26 kg/m.  Further details on both, my assessment(s), as well as the proposed treatment plan, please see below.  Controlled Substance Pharmacotherapy Assessment REMS (Risk Evaluation and Mitigation Strategy)  Analgesic: Hydrocodone/acetaminophen 7.5/325 1 tablet 4 times daily MME/day: 30 mg/day. Chauncey Fischer, RN  04/09/2018  9:25 AM  Sign at close encounter Nursing Pain Medication Assessment:  Safety precautions to be maintained throughout the outpatient stay will include: orient to surroundings, keep bed in low position, maintain call bell within reach at all times, provide assistance with transfer out of bed and ambulation.  Medication Inspection Compliance: Pill count conducted under aseptic conditions, in front of the patient. Neither the pills nor the bottle was removed from the patient's sight at any time. Once count was completed pills were immediately returned to the patient in their original bottle.  Medication: Hydrocodone/APAP Pill/Patch Count: 80 of 120 pills remain Pill/Patch Appearance: Markings consistent with prescribed medication Bottle Appearance: Standard pharmacy container. Clearly labeled. Filled Date: 10 / 10 / 2019 Last Medication intake:  Today   Pharmacokinetics: Liberation  and absorption (onset of action): WNL Distribution (time to peak effect): WNL Metabolism and excretion (duration of action): WNL         Pharmacodynamics: Desired effects: Analgesia: Ms. Weichert reports >50% benefit. Functional ability: Patient reports that medication allows her to accomplish basic ADLs Clinically meaningful improvement in function (CMIF): Sustained CMIF goals met Perceived effectiveness: Described as relatively effective, allowing for increase in activities of daily living (ADL) Undesirable effects: Side-effects or Adverse reactions: None reported Monitoring: [place] PMP: Online review of the past 53-monthperiod conducted. Compliant with practice rules and regulations Last UDS on record: Summary  Date Value Ref Range Status  02/02/2017 FINAL  Final    Comment:    ==================================================================== TOXASSURE COMP DRUG ANALYSIS,UR ==================================================================== Test                             Result       Flag       Units Drug Present and Declared for Prescription Verification   7-aminoclonazepam              330          EXPECTED   ng/mg creat    7-aminoclonazepam is an expected metabolite of clonazepam. Source    of clonazepam is a scheduled prescription medication.   Tramadol                       11726        EXPECTED   ng/mg creat   O-Desmethyltramadol            3096         EXPECTED   ng/mg creat   N-Desmethyltramadol            7048         EXPECTED   ng/mg creat    Source of tramadol is a prescription medication.    O-desmethyltramadol  and N-desmethyltramadol are expected    metabolites of tramadol.   Gabapentin                     PRESENT      EXPECTED   Pregabalin                     PRESENT      EXPECTED   Lamotrigine                    PRESENT      EXPECTED   Bupropion                      PRESENT      EXPECTED   Hydroxybupropion               PRESENT      EXPECTED     Hydroxybupropion is an expected metabolite of bupropion.   Mirtazapine                    PRESENT      EXPECTED   Acetaminophen                  PRESENT      EXPECTED   Lidocaine                      PRESENT      EXPECTED Drug Present not Declared for Prescription Verification   Alcohol, Ethyl                 0.124        UNEXPECTED g/dL    Sources of ethyl alcohol include alcoholic beverages or as a    fermentation product of glucose; glucose was not detected in this    specimen. Ethyl alcohol result should be interpreted in the    context of all available clinical and behavioral information.   Ibuprofen                      PRESENT      UNEXPECTED Drug Absent but Declared for Prescription Verification   Oxycodone                      Not Detected UNEXPECTED ng/mg creat   Tizanidine                     Not Detected UNEXPECTED    Tizanidine, as indicated in the declared medication list, is not    always detected even when used as directed.   Zolpidem                       Not Detected UNEXPECTED    Zolpidem, as indicated in the declared medication list, is not    always detected even when used as directed.   Diclofenac                     Not Detected UNEXPECTED    Diclofenac, as indicated in the declared medication list, is not    always detected even when used as directed.   Diphenhydramine                Not Detected UNEXPECTED   Guaifenesin                    Not Detected UNEXPECTED ====================================================================  Test                      Result    Flag   Units      Ref Range   Creatinine              27               mg/dL      >=20 ==================================================================== Declared Medications:  The flagging and interpretation on this report are based on the  following declared medications.  Unexpected results may arise from  inaccuracies in the declared medications.  **Note: The testing scope of this panel  includes these medications:  Bupropion (Wellbutrin)  Clonazepam (Klonopin)  Diphenhydramine (Benadryl)  Gabapentin (Neurontin)  Guaifenesin  Lamotrigine (Lamictal)  Mirtazapine (Remeron)  Oxycodone (Oxy IR)  Oxycodone (Roxicodone)  Pregabalin (Lyrica)  Tramadol (Ultram)  **Note: The testing scope of this panel does not include small to  moderate amounts of these reported medications:  Acetaminophen (Tylenol)  Diclofenac (Voltaren)  Lidocaine (Lidoderm)  Tizanidine (Zanaflex)  Zolpidem (Ambien)  **Note: The testing scope of this panel does not include following  reported medications:  Albuterol (Proventil)  Albuterol (Ventolin HFA)  Budenoside (Symbicort)  Dicyclomine (Bentyl)  Formoterol (Symbicort)  Furosemide (Lasix)  Insulin (Humalog)  Insulin (Lantus)  Iron (Ferrous Sulfate)  Lactulose  Loperamide (Imodium)  Magnesium (Milk of Magnesia)  Metoclopramide (Reglan)  Omeprazole (Prilosec)  Ondansetron (Zofran)  Oxybutynin (Ditropan)  Potassium (K-Dur)  Potassium (Klor-Con)  Ropinirole  Simvastatin (Zocor)  Spironolactone (Aldactone)  Topical  Vitamin C ==================================================================== For clinical consultation, please call [phone]. ====================================================================    UDS interpretation: Unexpected findings:          Medication Assessment Form: Reviewed. Patient indicates being compliant with therapy Treatment compliance: Compliant Risk Assessment Profile: Aberrant behavior: See prior evaluations. None observed or detected today Comorbid factors increasing risk of overdose: See prior notes. No additional risks detected today Opioid risk tool (ORT) (Total Score): 3 Personal History of Substance Abuse (SUD-Substance use disorder):  Alcohol: Negative  Illegal Drugs: Negative  Rx Drugs: Negative  ORT Risk Level calculation: Low Risk Risk of substance use disorder (SUD): Low Opioid  Risk Tool - 04/09/18 0939      Family History of Substance Abuse   Alcohol  Negative    Illegal Drugs  Negative    Rx Drugs  Negative      Personal History of Substance Abuse   Alcohol  Negative    Illegal Drugs  Negative    Rx Drugs  Negative      Age   Age between 31-45 years   No      History of Preadolescent Sexual Abuse   History of Preadolescent Sexual Abuse  Negative or Female      Psychological Disease   Psychological Disease  Positive    ADD  Negative    OCD  Negative    Bipolar  Positive    Schizophrenia  Negative    Depression  Positive      Total Score   Opioid Risk Tool Scoring  3    Opioid Risk Interpretation  Low Risk      ORT Scoring interpretation table:  Score <3 = Low Risk for SUD  Score between 4-7 = Moderate Risk for SUD  Score >8 = High Risk for Opioid Abuse   Risk Mitigation Strategies:  Patient Counseling: Covered Patient-Prescriber Agreement (PPA): Present and active  Notification to other healthcare providers:  Done  Pharmacologic Plan: No change in therapy, at this time.             Laboratory Chemistry  Inflammation Markers (CRP: Acute Phase) (ESR: Chronic Phase) Lab Results  Component Value Date   ESRSEDRATE 71 (H) 04/20/2016   LATICACIDVEN 1.1 07/20/2017                         Rheumatology Markers Lab Results  Component Value Date   ANA POS (A) 03/19/2012                        Renal Function Markers Lab Results  Component Value Date   BUN 25 (H) 11/05/2017   CREATININE 0.92 11/05/2017   GFRAA >60 11/05/2017   GFRNONAA >60 11/05/2017                             Hepatic Function Markers Lab Results  Component Value Date   AST 27 11/05/2017   ALT 23 11/05/2017   ALBUMIN 3.5 11/05/2017   ALKPHOS 166 (H) 11/05/2017   HCVAB NEGATIVE 02/03/2012   AMYLASE 30 06/12/2014   LIPASE 17 12/09/2015   AMMONIA 45 (H) 07/21/2017                        Electrolytes Lab Results  Component Value Date   NA 137 11/05/2017   K  3.7 11/05/2017   CL 101 11/05/2017   CALCIUM 9.1 11/05/2017   MG 2.1 07/21/2017   PHOS 3.7 07/21/2017                        Neuropathy Markers Lab Results  Component Value Date   VITAMINB12 431 11/13/2015   FOLATE 29.0 11/13/2015   HGBA1C 5.2 07/20/2016                        CNS Tests No results found for: COLORCSF, APPEARCSF, RBCCOUNTCSF, WBCCSF, POLYSCSF, LYMPHSCSF, EOSCSF, PROTEINCSF, GLUCCSF, JCVIRUS, CSFOLI, IGGCSF                      Bone Pathology Markers Lab Results  Component Value Date   VD25OH 30 11/12/2012   VD125OH2TOT 10 (L) 06/19/2008                         Coagulation Parameters Lab Results  Component Value Date   INR 1.00 03/07/2017   LABPROT 13.1 03/07/2017   APTT 37 (H) 03/07/2017   PLT 102 (L) 01/31/2018                        Cardiovascular Markers Lab Results  Component Value Date   BNP 108.0 (H) 07/28/2016   CKTOTAL 119 06/30/2012   CKMB 0.6 06/30/2012   TROPONINI <0.03 11/05/2017   HGB 11.2 01/31/2018   HCT 33.3 (L) 01/31/2018                         CA Markers No results found for: CEA, CA125, LABCA2                      Note: Lab results reviewed.  Recent Diagnostic Imaging Results  US ABDOMEN LIMITED RUQ CLINICAL DATA:  Hepatitis-C and  history of cirrhosis.  EXAM: ULTRASOUND ABDOMEN LIMITED RIGHT UPPER QUADRANT  COMPARISON:  08/15/2017  FINDINGS: Gallbladder:  Gallbladder has been surgically removed.  Common bile duct:  Diameter: 2.8 mm.  Liver:  Heterogeneity is noted with nodularity consistent with the given clinical history of cirrhosis. No focal mass is noted. Portal vein is patent on color Doppler imaging with normal direction of blood flow towards the liver.  IMPRESSION: Stable changes of cirrhosis.  No acute abnormality is noted.  Electronically Signed   By: [name] Catalina M.D.   On: 01/08/2018 13:02  Complexity Note: Imaging results reviewed. Results shared with Ms. Lansberry, using [employer].                          Meds   Current Outpatient Medications:  .  acetaminophen (TYLENOL) 325 MG tablet, Take 650 mg by mouth every 6 (six) hours as needed for mild pain., Disp: , Rfl:  .  albuterol (PROVENTIL HFA;VENTOLIN HFA) 108 (90 Base) MCG/ACT inhaler, Inhale 2 puffs into the lungs every 6 (six) hours as needed for wheezing or shortness of breath., Disp: , Rfl:  .  albuterol (PROVENTIL) (2.5 MG/3ML) 0.083% nebulizer solution, Take 2.5 mg by nebulization every 6 (six) hours as needed for wheezing or shortness of breath., Disp: , Rfl:  .  ALPRAZolam (XANAX) 0.25 MG tablet, Take 0.25 mg by mouth 2 (two) times daily as needed for sleep., Disp: , Rfl:  .  budesonide-formoterol (SYMBICORT) 160-4.5 MCG/ACT inhaler, Inhale 2 puffs into the lungs 2 (two) times daily. , Disp: , Rfl:  .  buPROPion (WELLBUTRIN XL) 150 MG 24 hr tablet, Take 1 tablet (150 mg total) by mouth daily., Disp: 90 tablet, Rfl: 1 .  clonazePAM (KLONOPIN) 0.5 MG tablet, Take 0.5 mg by mouth 3 (three) times daily. , Disp: , Rfl:  .  dicyclomine (BENTYL) 10 MG capsule, Take 10 mg by mouth 3 (three) times daily. , Disp: , Rfl:  .  ferrous sulfate 325 (65 FE) MG tablet, Take 325 mg by mouth 2 (two) times daily. , Disp: , Rfl:  .  furosemide (LASIX) 80 MG tablet, Take 80 mg by mouth., Disp: , Rfl:  .  hydrochlorothiazide (MICROZIDE) 12.5 MG capsule, Take 12.5 mg by mouth daily with supper., Disp: , Rfl:  .  HYDROcodone-acetaminophen (NORCO) 7.5-325 MG tablet, Take 1 tablet by mouth every 6 (six) hours as needed for moderate pain., Disp: , Rfl:  .  insulin glargine (LANTUS) 100 UNIT/ML injection, Inject 50 Units into the skin every evening. , Disp: , Rfl:  .  insulin lispro (HUMALOG) 100 UNIT/ML injection, Inject 8 Units into the skin 3 (three) times daily before meals. , Disp: , Rfl:  .  lactulose (CHRONULAC) 10 GM/15ML solution, Take 10 g by mouth daily. (0800), Disp: , Rfl:  .  lamoTRIgine (LAMICTAL) 100 MG tablet, Take 1 tablet (100  mg total) by mouth 2 (two) times daily. (0800 & 2000), Disp: 180 tablet, Rfl: 1 .  lamoTRIgine (LAMICTAL) 25 MG tablet, Take 1 tablet (25 mg total) by mouth 2 (two) times daily. To be added to the Lamictal 100 mg at 8 AM and 8 pm, Disp: 180 tablet, Rfl: 1 .  lidocaine (LIDODERM) 5 %, Place 2 patches onto the skin daily. Apply 1 patch to 2 areas on back (2 patches) every morning and remove at bedtime., Disp: , Rfl:  .  liraglutide (VICTOZA) 18 MG/3ML SOPN, Inject 1.8 mg into  the skin daily., Disp: , Rfl:  .  LYRICA 150 MG capsule, Take 150 mg by mouth 3 (three) times daily. (0800, 1400 & 2000), Disp: , Rfl:  .  metFORMIN (GLUCOPHAGE-XR) 500 MG 24 hr tablet, Take 500 mg by mouth twice daily., Disp: , Rfl:  .  metoCLOPramide (REGLAN) 5 MG tablet, Take 5 mg by mouth 4 (four) times daily -  before meals and at bedtime. (0800, 1200, 1600 & 2000), Disp: , Rfl:  .  MYRBETRIQ 25 MG TB24 tablet, , Disp: , Rfl:  .  nystatin (MYCOSTATIN/NYSTOP) powder, Apply topically 4 (four) times daily. (Patient taking differently: Apply topically 4 (four) times daily. Apply to inner thigh/ peri area twice daily and apply to abdomen 4 times daily.), Disp: 15 g, Rfl: 0 .  omeprazole (PRILOSEC) 20 MG capsule, Take 20 mg by mouth daily. , Disp: , Rfl:  .  oxybutynin (DITROPAN-XL) 5 MG 24 hr tablet, Take 5 mg by mouth daily. (0800), Disp: , Rfl:  .  RESTASIS 0.05 % ophthalmic emulsion, , Disp: , Rfl:  .  rOPINIRole (REQUIP) 3 MG tablet, Take 3 mg by mouth at bedtime. (2000), Disp: , Rfl:  .  senna (GERI-KOT) 8.6 MG tablet, Take 1 tablet by mouth daily., Disp: , Rfl:  .  simvastatin (ZOCOR) 10 MG tablet, Take 10 mg by mouth at bedtime., Disp: , Rfl:  .  spironolactone (ALDACTONE) 50 MG tablet, Take 50 mg by mouth 2 (two) times daily. (0800), Disp: , Rfl:  .  trolamine salicylate (ASPERCREME) 10 % cream, Apply 1 application topically 2 (two) times daily as needed for muscle pain., Disp: , Rfl:  .  zolpidem (AMBIEN) 5 MG tablet,  Take 5 mg by mouth daily., Disp: , Rfl:  .  gabapentin (NEURONTIN) 600 MG tablet, Take 1 tablet (600 mg total) by mouth 3 (three) times daily., Disp: 90 tablet, Rfl: 2 .  [START ON 05/20/2018] HYDROcodone-acetaminophen (NORCO) 7.5-325 MG tablet, Take 1 tablet by mouth every 6 (six) hours as needed for moderate pain., Disp: 90 tablet, Rfl: 0 .  [START ON 04/20/2018] HYDROcodone-acetaminophen (NORCO) 7.5-325 MG tablet, Take 1 tablet by mouth every 6 (six) hours as needed for moderate pain., Disp: 90 tablet, Rfl: 0 .  [START ON 06/19/2018] HYDROcodone-acetaminophen (NORCO) 7.5-325 MG tablet, Take 1 tablet by mouth every 6 (six) hours as needed for moderate pain., Disp: 90 tablet, Rfl: 0  ROS  Constitutional: Denies any fever or chills Gastrointestinal: No reported hemesis, hematochezia, vomiting, or acute GI distress Musculoskeletal: Denies any acute onset joint swelling, redness, loss of ROM, or weakness Neurological: No reported episodes of acute onset apraxia, aphasia, dysarthria, agnosia, amnesia, paralysis, loss of coordination, or loss of consciousness  Allergies  Ms. Hearty is allergic to doxycycline.  [place]  Drug: Ms. Regan  reports that she does not use drugs. Alcohol:  reports that she does not drink alcohol. Tobacco:  reports that she quit smoking about 5 years ago. Her smoking use included cigarettes. She has a 75.00 pack-year smoking history. She has never used smokeless tobacco. Medical:  has a past medical history of Adenomatous colon polyp (08/27/2014), Anemia, Anxiety, Aortic dissection ([place]), Arthritis, Asthma, Bipolar affective disorder ([place]), Blood transfusion (2000), Blood transfusion without reported diagnosis, Cancer ([place]), Chronic abdominal pain, Cirrhosis ([place]), Colon polyp, COPD (chronic obstructive pulmonary disease) ([place]), Depression, Diabetes mellitus without complication ([place]), Fatty liver (04/09/08), GERD (gastroesophageal reflux disease), H/O: CVA (cardiovascular  accident), H/O: rheumatic fever, Hepatitis, Hyperlipidemia, Hypertension, IBS (irritable bowel syndrome),  Incontinence, Insomnia, Lower extremity edema, Neuropathy, Obstructive sleep apnea, Platelets decreased (HCC), RLS (restless legs syndrome), Shortness of breath, Sleep apnea, Stroke ([place]), Ulcer, Unspecified disorders of nervous system, and Wears dentures. Surgical: Ms. Winne  has a past surgical history that includes Cholecystectomy; Rotator cuff repair; Abdominal hysterectomy; Lumbar fusion (10/09); Hemorroidectomy; Abdominal exploration surgery; Posterior cervical fusion/foraminotomy; Lumbar wound debridement (05/09/2011); Axillary artery cannulation via 8-mm Hemashield graft, median sternotomy, extracorporeal circulation with deep hypothermic circulatory arrest, repair of  aortic dessection (01/23/2009); cataract; Colonoscopy; Liver biopsy (04/10/2012); Back surgery; Eye surgery (Bilateral); and Knee arthroscopy with lateral release (Right, 11/15/2016). Family: family history includes Anxiety disorder in her brother; Breast cancer in her mother; Depression in her brother; Diabetes in her unknown relative; Lung cancer in her mother; Stomach cancer in her father.  Constitutional Exam  General appearance: Well nourished, well developed, and well hydrated. In no apparent acute distress Vitals:   04/09/18 0925  BP: (!) 100/54  Pulse: 80  Temp: 97.7 F (36.5 C)  SpO2: 96%  Weight: 216 lb (98 kg)  Height: 5' 3"  (1.6 m)  Psych/Mental status: Alert, oriented x 3 (person, place, & time)       Eyes: PERLA Respiratory: No evidence of acute respiratory distress  Lumbar Spine Area Exam  Skin & Axial Inspection: No masses, redness, or swelling Alignment: Symmetrical Functional ROM: Unrestricted ROM       Stability: No instability detected Muscle Tone/Strength: Functionally intact. No obvious neuro-muscular anomalies detected. Sensory (Neurological): Unimpaired Palpation: No palpable anomalies        Provocative Tests: Hyperextension/rotation test: deferred today       Lumbar quadrant test (Kemp's test): deferred today       Lateral bending test: deferred today       Patrick's Maneuver: Unable to perform due to pain               Gait & Posture Assessment  Ambulation: Patient came in today in a wheel chair Gait: Relatively normal for age and body habitus Posture: Antalgic   Lower Extremity Exam    Side: Right lower extremity  Side: Left lower extremity  Stability: No instability observed          Stability: No instability observed          Skin & Extremity Inspection: Edema  Skin & Extremity Inspection: Skin color, temperature, and hair growth are WNL. No peripheral edema or cyanosis. No masses, redness, swelling, asymmetry, or associated skin lesions. No contractures.  Functional ROM: Pain restricted ROM                  Functional ROM: Unrestricted ROM                  Muscle Tone/Strength: Guarding  Muscle Tone/Strength: Functionally intact. No obvious neuro-muscular anomalies detected.  Sensory (Neurological): Unimpaired  Sensory (Neurological): Unimpaired  Palpation: Complains of area being tender to palpation  Palpation: No palpable anomalies   Assessment  Primary Diagnosis & Pertinent Problem List: The primary encounter diagnosis was Chronic hip pain, right. Diagnoses of Tricompartment osteoarthritis of right knee, Chronic bilateral low back pain, unspecified whether sciatica present, Closed impacted fracture of right hip, initial encounter ([place]), Primary osteoarthritis of right knee, and Chronic pain syndrome were also pertinent to this visit.  Status Diagnosis  Worsening Worsening Persistent 1. Chronic hip pain, right   2. Tricompartment osteoarthritis of right knee   3. Chronic bilateral low back pain, unspecified whether sciatica present   4.  Closed impacted fracture of right hip, initial encounter ([place])   5. Primary osteoarthritis of right knee   6. Chronic pain  syndrome     Problems updated and reviewed during this visit: Problem  Chronic Hip Pain, Right  Closed Impacted Fracture of Hip ([facility])  Unsteady Gait  Diabetic Peripheral Neuropathy Associated With Type 2 Diabetes Mellitus ([facility])  Laceration Without Foreign Body, Right Lower Leg, Initial Encounter  Chronic Obstructive Pulmonary Disease, Unspecified ([facility])  Unilateral Primary Osteoarthritis, Right Knee  Sleep Apnea  Gastroesophageal Reflux Disease  Anxiety Disorder  Chronic Pain Syndrome   Qualifier: Diagnosis of  By: Deborra Medina MD, Talia     Irritable Bowel Syndrome With Constipation   Qualifier: Diagnosis of  By: Deatra Ina MD, Sandy Salaam    EDEMA, LOCALIZED   Qualifier: Diagnosis of  By: Maxie Better FNP, Rosalita Levan    Restless Legs Syndrome  Hyperlipidemia   Qualifier: Diagnosis of  By: Ronnald Ramp CNA/MA, Jessica     Hypertension   Qualifier: Diagnosis of  By: Ronnald Ramp CNA/MA, Jessica     Urinary Incontinence   Qualifier: Diagnosis of  By: Ronnald Ramp CNA/MA, Jessica      Plan of Care  Pharmacotherapy (Medications Ordered): Meds ordered this encounter  Medications  . gabapentin (NEURONTIN) 600 MG tablet    Sig: Take 1 tablet (600 mg total) by mouth 3 (three) times daily.    Dispense:  90 tablet    Refill:  2    Order Specific Question:   Supervising Provider    Answer:   Milinda Pointer [phone]  . HYDROcodone-acetaminophen (NORCO) 7.5-325 MG tablet    Sig: Take 1 tablet by mouth every 6 (six) hours as needed for moderate pain.    Dispense:  90 tablet    Refill:  0    Do not place this medication, or any other prescription from our practice, on "Automatic Refill". Patient may have prescription filled one day early if pharmacy is closed on scheduled refill date.    Order Specific Question:   Supervising Provider    Answer:   Milinda Pointer [phone]  . HYDROcodone-acetaminophen (NORCO) 7.5-325 MG tablet    Sig: Take 1 tablet by mouth every 6 (six) hours as needed for  moderate pain.    Dispense:  90 tablet    Refill:  0    Do not place this medication, or any other prescription from our practice, on "Automatic Refill". Patient may have prescription filled one day early if pharmacy is closed on scheduled refill date.    Order Specific Question:   Supervising Provider    Answer:   Milinda Pointer [phone]  . HYDROcodone-acetaminophen (NORCO) 7.5-325 MG tablet    Sig: Take 1 tablet by mouth every 6 (six) hours as needed for moderate pain.    Dispense:  90 tablet    Refill:  0    Do not place this medication, or any other prescription from our practice, on "Automatic Refill". Patient may have prescription filled one day early if pharmacy is closed on scheduled refill date.    Order Specific Question:   Supervising Provider    Answer:   Milinda Pointer [phone]   New Prescriptions   HYDROCODONE-ACETAMINOPHEN (NORCO) 7.5-325 MG TABLET    Take 1 tablet by mouth every 6 (six) hours as needed for moderate pain.   Medications administered today: Ziyon Cedotal. Byrd had no medications administered during this visit. Lab-work, procedure(s), and/or referral(s): Orders Placed This Encounter  Procedures  . MR HIP RIGHT WO  CONTRAST   Imaging and/or referral(s): MR HIP RIGHT WO CONTRAST  Interventional therapies: Planned, scheduled, and/or pending:   Not at this time.    Provider-requested follow-up: Return in about 3 months (around 07/10/2018) for MedMgmt.  Future Appointments  Date Time Provider [place]  04/12/2018  9:00 AM Lubertha South, LCSW [facility] None  04/27/2018  9:00 AM [facility]  05/10/2018  9:45 AM Ursula Alert, MD [facility] None  07/03/2018 10:15 AM Algernon Huxley, MD [facility] None  07/10/2018  9:00 AM Vevelyn Francois, NP [facility] None  07/11/2018  9:00 AM [facility]  07/13/2018 10:30 AM Lequita Asal, MD [place] None   Primary Care Physician: Odessa Fleming, NP Location: [facility] Outpatient Pain  Management Facility Note by: Vevelyn Francois NP Date: 04/09/2018; Time: 1:47 PM  Pain Score Disclaimer: We use the NRS-11 scale. This is a self-reported, subjective measurement of pain severity with only modest accuracy. It is used primarily to identify changes within a particular patient. It must be understood that outpatient pain scales are significantly less accurate that those used for research, where they can be applied under ideal controlled circumstances with minimal exposure to variables. In reality, the score is likely to be a combination of pain intensity and pain affect, where pain affect describes the degree of emotional arousal or changes in action readiness caused by the sensory experience of pain. Factors such as social and work situation, setting, emotional state, anxiety levels, expectation, and prior pain experience may influence pain perception and show large inter-individual differences that may also be affected by time variables.  Patient instructions provided during this appointment: Patient Instructions   ____________________________________________________________________________________________  Medication Rules  Applies to: All patients receiving prescriptions (written or electronic).  Pharmacy of record: Pharmacy where electronic prescriptions will be sent. If written prescriptions are taken to a different pharmacy, please inform the nursing staff. The pharmacy listed in the electronic medical record should be the one where you would like electronic prescriptions to be sent.  Prescription refills: Only during scheduled appointments. Applies to both, written and electronic prescriptions.  NOTE: The following applies primarily to controlled substances (Opioid* Pain Medications).   Patient's responsibilities: 1. Pain Pills: Bring all pain pills to every appointment (except for procedure appointments). 2. Pill Bottles: Bring pills in original pharmacy bottle. Always  bring newest bottle. Bring bottle, even if empty. 3. Medication refills: You are responsible for knowing and keeping track of what medications you need refilled. The day before your appointment, write a list of all prescriptions that need to be refilled. Bring that list to your appointment and give it to the admitting nurse. Prescriptions will be written only during appointments. If you forget a medication, it will not be "Called in", "Faxed", or "electronically sent". You will need to get another appointment to get these prescribed. 4. Prescription Accuracy: You are responsible for carefully inspecting your prescriptions before leaving our office. Have the discharge nurse carefully go over each prescription with you, before taking them home. Make sure that your name is accurately spelled, that your address is correct. Check the name and dose of your medication to make sure it is accurate. Check the number of pills, and the written instructions to make sure they are clear and accurate. Make sure that you are given enough medication to last until your next medication refill appointment. 5. Taking Medication: Take medication as prescribed. Never take more pills than instructed. Never take medication more frequently than  prescribed. Taking less pills or less frequently is permitted and encouraged, when it comes to controlled substances (written prescriptions).  6. Inform other Doctors: Always inform, all of your healthcare providers, of all the medications you take. 7. Pain Medication from other Providers: You are not allowed to accept any additional pain medication from any other Doctor or Healthcare provider. There are two exceptions to this rule. (see below) In the event that you require additional pain medication, you are responsible for notifying us, as stated below. 8. Medication Agreement: You are responsible for carefully reading and following our Medication Agreement. This must be signed before receiving  any prescriptions from our practice. Safely store a copy of your signed Agreement. Violations to the Agreement will result in no further prescriptions. (Additional copies of our Medication Agreement are available upon request.) 9. Laws, Rules, & Regulations: All patients are expected to follow all Federal and [employer], [employer], Rules, [employer]. Ignorance of the Laws does not constitute a valid excuse. The use of any illegal substances is prohibited. 10. Adopted CDC guidelines & recommendations: Target dosing levels will be at or below 60 MME/day. Use of benzodiazepines** is not recommended.  Exceptions: There are only two exceptions to the rule of not receiving pain medications from other Healthcare Providers. 1. Exception #1 (Emergencies): In the event of an emergency (i.e.: accident requiring emergency care), you are allowed to receive additional pain medication. However, you are responsible for: As soon as you are able, call our office [phone], at any time of the day or night, and leave a message stating your name, the date and nature of the emergency, and the name and dose of the medication prescribed. In the event that your call is answered by a member of our staff, make sure to document and save the date, time, and the name of the person that took your information.  2. Exception #2 (Planned Surgery): In the event that you are scheduled by another doctor or dentist to have any type of surgery or procedure, you are allowed (for a period no longer than 30 days), to receive additional pain medication, for the acute post-op pain. However, in this case, you are responsible for picking up a copy of our "Post-op Pain Management for Surgeons" handout, and giving it to your surgeon or dentist. This document is available at our office, and does not require an appointment to obtain it. Simply go to our office during business hours (Monday-Thursday from 8:00 AM to 4:00 PM) (Friday 8:00 AM to 12:00  Noon) or if you have a scheduled appointment with [name], prior to your surgery, and ask for it by name. In addition, you will need to provide [name] with your name, name of your surgeon, type of surgery, and date of procedure or surgery.  *Opioid medications include: morphine, codeine, oxycodone, oxymorphone, hydrocodone, hydromorphone, meperidine, tramadol, tapentadol, buprenorphine, fentanyl, methadone. **Benzodiazepine medications include: diazepam (Valium), alprazolam (Xanax), clonazepam (Klonopine), lorazepam (Ativan), clorazepate (Tranxene), chlordiazepoxide (Librium), estazolam (Prosom), oxazepam (Serax), temazepam (Restoril), triazolam (Halcion) (Last updated: 08/03/2017) ____________________________________________________________________________________________   ____________________________________________________________________________________________  Pain Scale  Introduction: The pain score used by this practice is the Verbal Numerical Rating Scale (VNRS-11). This is an 11-point scale. It is for adults and children 10 years or older. There are significant differences in how the pain score is reported, used, and applied. Forget everything you learned in the past and learn this scoring system.  General Information: The scale should reflect your current level of pain. Unless you are  specifically asked for the level of your worst pain, or your average pain. If you are asked for one of these two, then it should be understood that it is over the past 24 hours.  Basic Activities of Daily Living (ADL): Personal hygiene, dressing, eating, transferring, and using restroom.  Instructions: Most patients tend to report their level of pain as a combination of two factors, their physical pain and their psychosocial pain. This last one is also known as "suffering" and it is reflection of how physical pain affects you socially and psychologically. From now on, report them separately. From this point on,  when asked to report your pain level, report only your physical pain. Use the following table for reference.  Pain Clinic Pain Levels (0-5/10)  Pain Level Score  Description  No Pain 0   Mild pain 1 Nagging, annoying, but does not interfere with basic activities of daily living (ADL). Patients are able to eat, bathe, get dressed, toileting (being able to get on and off the toilet and perform personal hygiene functions), transfer (move in and out of bed or a chair without assistance), and maintain continence (able to control bladder and bowel functions). Blood pressure and heart rate are unaffected. A normal heart rate for a healthy adult ranges from 60 to 100 bpm (beats per minute).   Mild to moderate pain 2 Noticeable and distracting. Impossible to hide from other people. More frequent flare-ups. Still possible to adapt and function close to normal. It can be very annoying and may have occasional stronger flare-ups. With discipline, patients may get used to it and adapt.   Moderate pain 3 Interferes significantly with activities of daily living (ADL). It becomes difficult to feed, bathe, get dressed, get on and off the toilet or to perform personal hygiene functions. Difficult to get in and out of bed or a chair without assistance. Very distracting. With effort, it can be ignored when deeply involved in activities.   Moderately severe pain 4 Impossible to ignore for more than a few minutes. With effort, patients may still be able to manage work or participate in some social activities. Very difficult to concentrate. Signs of autonomic nervous system discharge are evident: dilated pupils (mydriasis); mild sweating (diaphoresis); sleep interference. Heart rate becomes elevated (>115 bpm). Diastolic blood pressure (lower number) rises above 100 mmHg. Patients find relief in laying down and not moving.   Severe pain 5 Intense and extremely unpleasant. Associated with frowning face and frequent crying.  Pain overwhelms the senses.  Ability to do any activity or maintain social relationships becomes significantly limited. Conversation becomes difficult. Pacing back and forth is common, as getting into a comfortable position is nearly impossible. Pain wakes you up from deep sleep. Physical signs will be obvious: pupillary dilation; increased sweating; goosebumps; brisk reflexes; cold, clammy hands and feet; nausea, vomiting or dry heaves; loss of appetite; significant sleep disturbance with inability to fall asleep or to remain asleep. When persistent, significant weight loss is observed due to the complete loss of appetite and sleep deprivation.  Blood pressure and heart rate becomes significantly elevated. Caution: If elevated blood pressure triggers a pounding headache associated with blurred vision, then the patient should immediately seek attention at an urgent or emergency care unit, as these may be signs of an impending stroke.    Emergency Department Pain Levels (6-10/10)  Emergency Room Pain 6 Severely limiting. Requires emergency care and should not be seen or managed at an outpatient pain management facility. Communication  becomes difficult and requires great effort. Assistance to reach the emergency department may be required. Facial flushing and profuse sweating along with potentially dangerous increases in heart rate and blood pressure will be evident.   Distressing pain 7 Self-care is very difficult. Assistance is required to transport, or use restroom. Assistance to reach the emergency department will be required. Tasks requiring coordination, such as bathing and getting dressed become very difficult.   Disabling pain 8 Self-care is no longer possible. At this level, pain is disabling. The individual is unable to do even the most "basic" activities such as walking, eating, bathing, dressing, transferring to a bed, or toileting. Fine motor skills are lost. It is difficult to think clearly.    Incapacitating pain 9 Pain becomes incapacitating. Thought processing is no longer possible. Difficult to remember your own name. Control of movement and coordination are lost.   The worst pain imaginable 10 At this level, most patients pass out from pain. When this level is reached, collapse of the autonomic nervous system occurs, leading to a sudden drop in blood pressure and heart rate. This in turn results in a temporary and dramatic drop in blood flow to the brain, leading to a loss of consciousness. Fainting is one of the body's self defense mechanisms. Passing out puts the brain in a calmed state and causes it to shut down for a while, in order to begin the healing process.    Summary: 1. Refer to this scale when providing [name] with your pain level. 2. Be accurate and careful when reporting your pain level. This will help with your care. 3. Over-reporting your pain level will lead to loss of credibility. 4. Even a level of 1/10 means that there is pain and will be treated at our facility. 5. High, inaccurate reporting will be documented as "Symptom Exaggeration", leading to loss of credibility and suspicions of possible secondary gains such as obtaining more narcotics, or wanting to appear disabled, for fraudulent reasons. 6. Only pain levels of 5 or below will be seen at our facility. 7. Pain levels of 6 and above will be sent to the Emergency Department and the appointment cancelled. ____________________________________________________________________________________________

## 2018-04-09 NOTE — Telephone Encounter (Signed)
Called and spoke with patient, was able to get the EPW score - 15 Routing message to [facility] and [facility] for review.

## 2018-04-09 NOTE — Progress Notes (Signed)
Nursing Pain Medication Assessment:  Safety precautions to be maintained throughout the outpatient stay will include: orient to surroundings, keep bed in low position, maintain call bell within reach at all times, provide assistance with transfer out of bed and ambulation.  Medication Inspection Compliance: Pill count conducted under aseptic conditions, in front of the patient. Neither the pills nor the bottle was removed from the patient's sight at any time. Once count was completed pills were immediately returned to the patient in their original bottle.  Medication: Hydrocodone/APAP Pill/Patch Count: 80 of 120 pills remain Pill/Patch Appearance: Markings consistent with prescribed medication Bottle Appearance: Standard pharmacy container. Clearly labeled. Filled Date: 10 / 10 / 2019 Last Medication intake:  Today

## 2018-04-10 NOTE — Discharge Instructions (Signed)
General Anesthesia, Adult, Care After °These instructions provide you with information about caring for yourself after your procedure. Your health care provider may also give you more specific instructions. Your treatment has been planned according to current medical practices, but problems sometimes occur. Call your health care provider if you have any problems or questions after your procedure. °What can I expect after the procedure? °After the procedure, it is common to have: °· Vomiting. °· A sore throat. °· Mental slowness. ° °It is common to feel: °· Nauseous. °· Cold or shivery. °· Sleepy. °· Tired. °· Sore or achy, even in parts of your body where you did not have surgery. ° °Follow these instructions at home: °For at least 24 hours after the procedure: °· Do not: °? Participate in activities where you could fall or become injured. °? Drive. °? Use heavy machinery. °? Drink alcohol. °? Take sleeping pills or medicines that cause drowsiness. °? Make important decisions or sign legal documents. °? Take care of children on your own. °· Rest. °Eating and drinking °· If you vomit, drink water, juice, or soup when you can drink without vomiting. °· Drink enough fluid to keep your urine clear or pale yellow. °· Make sure you have little or no nausea before eating solid foods. °· Follow the diet recommended by your health care provider. °General instructions °· Have a responsible adult stay with you until you are awake and alert. °· Return to your normal activities as told by your health care provider. Ask your health care provider what activities are safe for you. °· Take over-the-counter and prescription medicines only as told by your health care provider. °· If you smoke, do not smoke without supervision. °· Keep all follow-up visits as told by your health care provider. This is important. °Contact a health care provider if: °· You continue to have nausea or vomiting at home, and medicines are not helpful. °· You  cannot drink fluids or start eating again. °· You cannot urinate after 8-12 hours. °· You develop a skin rash. °· You have fever. °· You have increasing redness at the site of your procedure. °Get help right away if: °· You have difficulty breathing. °· You have chest pain. °· You have unexpected bleeding. °· You feel that you are having a life-threatening or urgent problem. °This information is not intended to replace advice given to you by your health care provider. Make sure you discuss any questions you have with your health care provider. °Document Released: 08/29/2000 Document Revised: 10/26/2015 Document Reviewed: 05/07/2015 °Elsevier Interactive Patient Education © 2018 [employer] ° °

## 2018-04-11 ENCOUNTER — Encounter
Admission: RE | Disposition: A | Discharge status: Home / Self Care | Payer: Self-pay | Source: Ambulatory Visit | Attending: Otolaryngology

## 2018-04-11 ENCOUNTER — Ambulatory Visit: Payer: Medicare Other | Admitting: Anesthesiology

## 2018-04-11 ENCOUNTER — Ambulatory Visit
Admission: RE | Admit: 2018-04-11 | Discharge: 2018-04-11 | Disposition: A | Discharge status: Home / Self Care | Payer: Medicare Other | Source: Ambulatory Visit | Attending: Otolaryngology | Admitting: Otolaryngology

## 2018-04-11 DIAGNOSIS — J449 Chronic obstructive pulmonary disease, unspecified: Secondary | ICD-10-CM | POA: Diagnosis not present

## 2018-04-11 DIAGNOSIS — S01312A Laceration without foreign body of left ear, initial encounter: Secondary | ICD-10-CM | POA: Diagnosis present

## 2018-04-11 DIAGNOSIS — Z87891 Personal history of nicotine dependence: Secondary | ICD-10-CM | POA: Diagnosis not present

## 2018-04-11 DIAGNOSIS — I890 Lymphedema, not elsewhere classified: Secondary | ICD-10-CM | POA: Diagnosis not present

## 2018-04-11 DIAGNOSIS — I10 Essential (primary) hypertension: Secondary | ICD-10-CM | POA: Insufficient documentation

## 2018-04-11 DIAGNOSIS — E119 Type 2 diabetes mellitus without complications: Secondary | ICD-10-CM | POA: Insufficient documentation

## 2018-04-11 DIAGNOSIS — X58XXXA Exposure to other specified factors, initial encounter: Secondary | ICD-10-CM | POA: Insufficient documentation

## 2018-04-11 HISTORY — DX: Polyneuropathy, unspecified: G62.9

## 2018-04-11 HISTORY — DX: Presence of dental prosthetic device (complete) (partial): Z97.2

## 2018-04-11 HISTORY — DX: Irritable bowel syndrome, unspecified: K58.9

## 2018-04-11 HISTORY — PX: FACIAL LACERATION REPAIR: SHX6589

## 2018-04-11 LAB — GLUCOSE, CAPILLARY
Glucose-Capillary: 116 mg/dL — ABNORMAL HIGH (ref 70–99)
Glucose-Capillary: 112 mg/dL — ABNORMAL HIGH (ref 70–99)

## 2018-04-11 SURGERY — REPAIR, LACERATION, FACE
Anesthesia: Monitor Anesthesia Care | Site: Ear | Laterality: Left

## 2018-04-11 MED ORDER — ONDANSETRON HCL 4 MG/2ML IJ SOLN
4.0000 mg | Freq: Once | INTRAMUSCULAR | Status: AC | PRN
  Administered 2018-04-11: 4 mg via INTRAVENOUS

## 2018-04-11 MED ORDER — BACITRACIN 500 UNIT/GM EX OINT: 1.0000 "application " | TOPICAL_OINTMENT | Freq: Two times a day (BID) | CUTANEOUS | 0 refills | Status: DC

## 2018-04-11 MED ORDER — BACITRACIN 500 UNIT/GM EX OINT
TOPICAL_OINTMENT | CUTANEOUS | Status: DC | PRN
  Administered 2018-04-11: 1 via TOPICAL

## 2018-04-11 MED ORDER — FENTANYL CITRATE (PF) 100 MCG/2ML IJ SOLN: 25.0000 ug | INTRAMUSCULAR | Status: DC | PRN

## 2018-04-11 MED ORDER — FENTANYL CITRATE (PF) 100 MCG/2ML IJ SOLN
INTRAMUSCULAR | Status: DC | PRN
  Administered 2018-04-11: 50 ug via INTRAVENOUS

## 2018-04-11 MED ORDER — LACTATED RINGERS IV SOLN
INTRAVENOUS | Status: DC
  Administered 2018-04-11: 08:00:00 via INTRAVENOUS

## 2018-04-11 MED ORDER — OXYCODONE HCL 5 MG/5ML PO SOLN: 5.0000 mg | Freq: Once | ORAL | Status: DC | PRN

## 2018-04-11 MED ORDER — LIDOCAINE-EPINEPHRINE 1 %-1:100000 IJ SOLN
INTRAMUSCULAR | Status: DC | PRN
  Administered 2018-04-11: 1 mL

## 2018-04-11 MED ORDER — LACTATED RINGERS IV SOLN: INTRAVENOUS | Status: DC

## 2018-04-11 MED ORDER — ACETAMINOPHEN 10 MG/ML IV SOLN: 1000.0000 mg | Freq: Once | INTRAVENOUS | Status: DC | PRN

## 2018-04-11 MED ORDER — MIDAZOLAM HCL 2 MG/2ML IJ SOLN
INTRAMUSCULAR | Status: DC | PRN
  Administered 2018-04-11: 1 mg via INTRAVENOUS

## 2018-04-11 MED ORDER — OXYCODONE HCL 5 MG PO TABS: 5.0000 mg | ORAL_TABLET | Freq: Once | ORAL | Status: DC | PRN

## 2018-04-11 SURGICAL SUPPLY — 10 items
CANISTER SUCT 1200ML W/VALVE (MISCELLANEOUS) ×3 IMPLANT
GLOVE BIO SURGEON STRL SZ7.5 (GLOVE) ×3 IMPLANT
GOWN STRL REUS W/ TWL LRG LVL3 (GOWN DISPOSABLE) ×2 IMPLANT
GOWN STRL REUS W/TWL LRG LVL3 (GOWN DISPOSABLE) ×6
KIT TURNOVER KIT A (KITS) ×3 IMPLANT
NS IRRIG 500ML POUR BTL (IV SOLUTION) ×3 IMPLANT
PACK HEAD/NECK (MISCELLANEOUS) ×3 IMPLANT
SUCTION FRAZIER TIP 10 FR DISP (SUCTIONS) ×3 IMPLANT
SUT PROLENE 6 0 P 1 18 (SUTURE) ×2 IMPLANT
SUTURE VIC 5-0 PS5 (SUTURE) ×2 IMPLANT

## 2018-04-11 NOTE — H&P (Signed)
..  History and Physical paper copy reviewed and updated date of procedure and will be scanned into system.  Patient seen and examined and marked.  

## 2018-04-11 NOTE — Anesthesia Procedure Notes (Signed)
Performed by: Symone Cornman, CRNA Pre-anesthesia Checklist: Patient identified, Emergency Drugs available, Suction available, Timeout performed and Patient being monitored Patient Re-evaluated:Patient Re-evaluated prior to induction Oxygen Delivery Method: Nasal cannula Placement Confirmation: positive ETCO2       

## 2018-04-11 NOTE — Transfer of Care (Signed)
Immediate Anesthesia Transfer of Care Note  Patient: Stephanie [name]  Procedure(s) Performed: EARLOBE REPAIR (Left Ear)  Patient Location: PACU  Anesthesia Type: MAC  Level of Consciousness: awake, alert  and patient cooperative  Airway and Oxygen Therapy: Patient Spontanous Breathing and Patient connected to supplemental oxygen  Post-op Assessment: Post-op Vital signs reviewed, Patient's Cardiovascular Status Stable, Respiratory Function Stable, Patent Airway and No signs of Nausea or vomiting  Post-op Vital Signs: Reviewed and stable  Complications: No apparent anesthesia complications

## 2018-04-11 NOTE — Telephone Encounter (Signed)
Pt is calling back [phone]

## 2018-04-11 NOTE — Telephone Encounter (Signed)
Stephanie [name] noted 2days ago she added epworth scale to the order and it was faxed to [facility] they will do the precert and then call the pt Stephanie [name]

## 2018-04-11 NOTE — Anesthesia Postprocedure Evaluation (Signed)
Anesthesia Post Note  Patient: Stephanie [name]  Procedure(s) Performed: EARLOBE REPAIR (Left Ear)  Patient location during evaluation: PACU Anesthesia Type: MAC Level of consciousness: awake and alert, oriented and patient cooperative Pain management: pain level controlled Vital Signs Assessment: post-procedure vital signs reviewed and stable Respiratory status: spontaneous breathing, nonlabored ventilation and respiratory function stable Cardiovascular status: blood pressure returned to baseline and stable Postop Assessment: adequate PO intake Anesthetic complications: no    Darrin Nipper

## 2018-04-11 NOTE — Op Note (Signed)
..  04/11/2018  8:41 AM    Stephanie [name]  [id number]   Pre-Op Dx:  [name]  Post-op Dx: [name]  Proc:Repair of left ear lobe laceration  Surg: Nalina Yeatman  Anes:  IV sedation  EBL:  None  Comp:  None  Findings:  Successful revision repair of left ear lobe tear  Procedure: With the patient in a comfortable supine position, IV anesthesia was administered.  At an appropriate level, under loop magnification, the left ear lobe was inspected.  1/84m of 1% lidocaine with 1:100,000 epinephrine was injected into the left ear lobe.  The patient was prepped and draped in a sterile fashion.  Using a sterile tongue depressor and 15 blade the edges of scar from previous tear were excised in a v-shaped through and through fashion.  At this time, a 5.0 vicryl stitch was used to approximate the two lobule ends for good skin closure.  6.0 prolene was used in an interupted fashion for skin closure anterior and posterior and at the lobule tip.  At this time, the patient was cleaned and dressed with bacitracin ointment.  Following this  The patient was returned to anesthesia, awakened, and transferred to recovery in stable condition.  Dispo:  PACU to home  Plan: Bacitracin ointment and follow up in my office in 1 week.   [name] 8:41 AM 04/11/2018

## 2018-04-11 NOTE — Telephone Encounter (Signed)
Called patient and she is aware. Patient verbalized understanding. Nothing further needed.

## 2018-04-11 NOTE — Telephone Encounter (Signed)
[facility] please advise once this has been done. Thank you.

## 2018-04-12 ENCOUNTER — Telehealth: Payer: Self-pay | Admitting: Pulmonary Disease

## 2018-04-12 ENCOUNTER — Encounter: Payer: Self-pay | Admitting: Otolaryngology

## 2018-04-12 ENCOUNTER — Ambulatory Visit: Payer: Medicaid Other | Admitting: Licensed Clinical Social Worker

## 2018-04-12 NOTE — Telephone Encounter (Signed)
Called and spoke with patient, she stated that this was regarding another office and she made a mistake. Nothing further needed.

## 2018-04-12 NOTE — Progress Notes (Signed)
   THERAPIST PROGRESS NOTE  Session Time: 44mn  Participation Level: Active  Behavioral Response: CasualAlertDepressed  Type of Therapy: Individual Therapy  Treatment Goals addressed: Coping  Interventions: Solution Focused  Summary: Stephanie TREVIZOis a 67y.o. female who presents with continued symptoms of her diagnosis.  Patient reports a "favorable" mood.  Patient reports that she has attempted to be open and complete coping skills daily.  Patient continues to report difficulty with sleep and being engaged with other residents.  Patient discussed her family and became sad when she realized that no one visits her. Discussion of current stressors and progress.  Explored how she can improve her coping skills and engage more. Explored family dynamics.   Suicidal/Homicidal: No  Plan: Return again in 2 weeks.  Diagnosis: Axis I: Bipolar, Depressed    Axis II: No diagnosis    NLubertha South LCSW 03/16/2018

## 2018-04-26 IMAGING — CT CT HUMERUS*L* W/O CM
3 of 6 series · 12 of 36 positions shown, 14 images · non-contrast
Comparison: None.

CLINICAL DATA: Patient pulled a muscle in the left shoulder 2
months ago. Pain running into the left arm. Multiple falls. No
previous relevant surgery.

EXAM:
CT OF THE UPPER LEFT EXTREMITY WITHOUT CONTRAST
TECHNIQUE: Multidetector CT imaging of the left upper arm was performed
according to the standard protocol.

[Series 9: shoulder · axial · 0.35mm/px · z∈[-1157,-966]mm · 5 of 176 slices shown, 7 images (1 of 3)]
[im 30/176  soft-tissue]
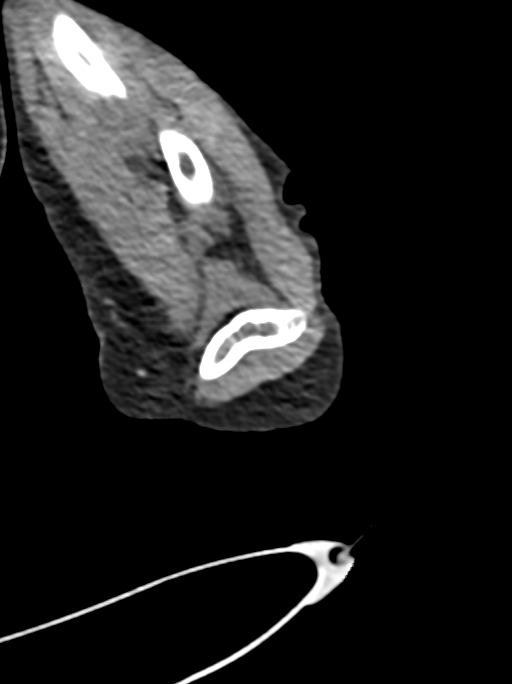
[im 30/176  bone]
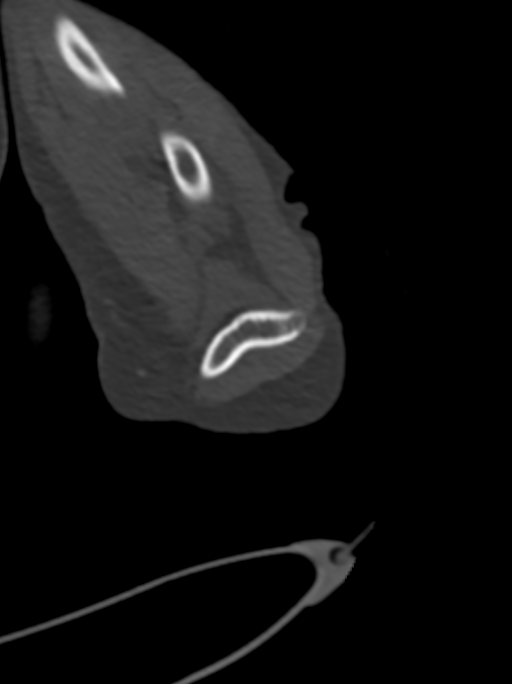
[im 59/176  bone]
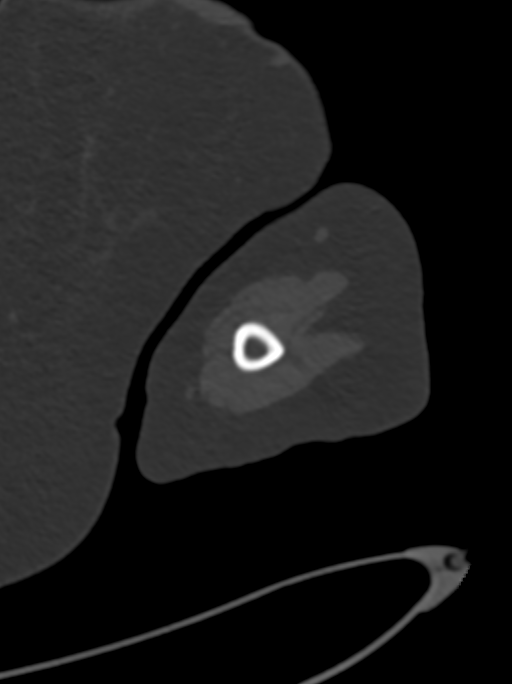
[im 88/176  bone]
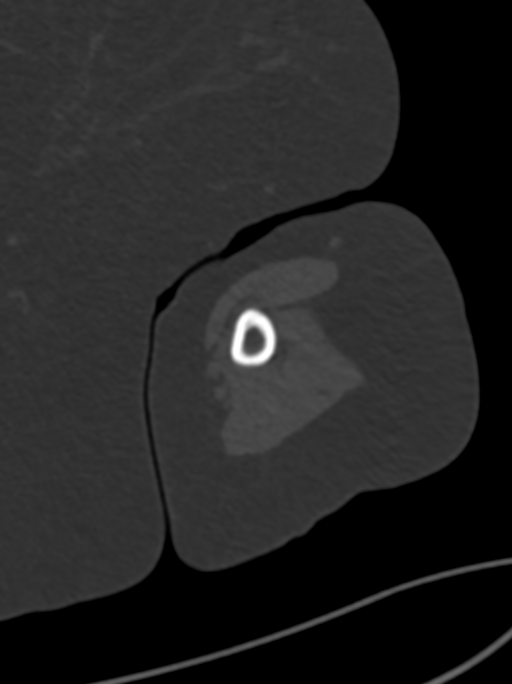
[im 117/176  bone]
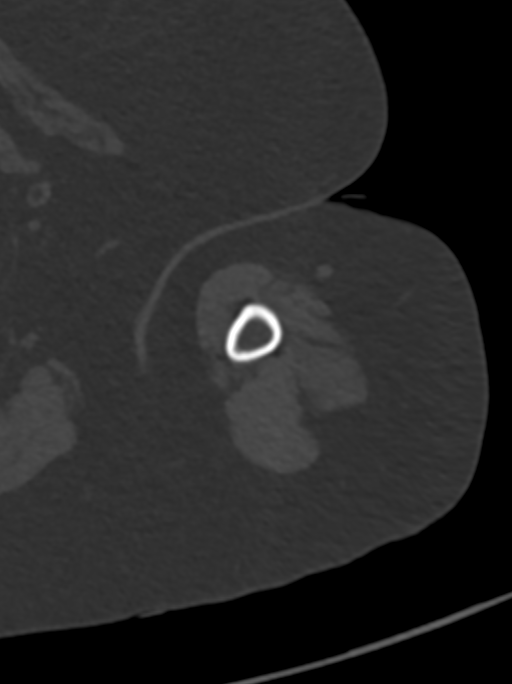
[im 146/176  soft-tissue]
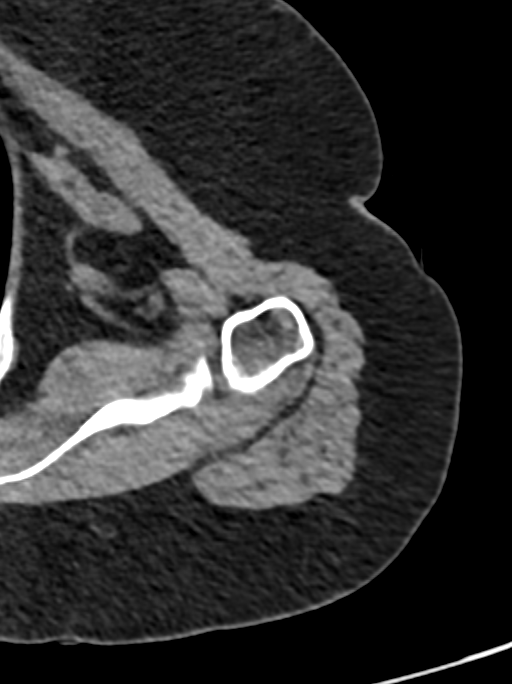
[im 146/176  bone]
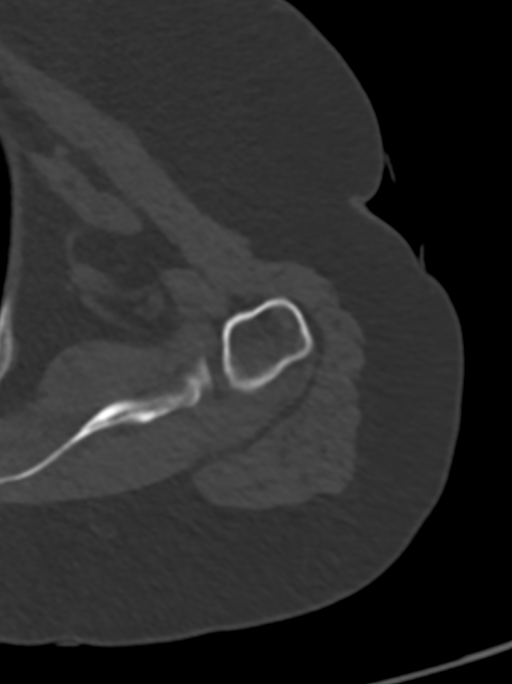

[Series 13: shoulder · coronal · 0.35mm/px · 1 of 119 slices shown (2 of 3)]
[im 60/119  bone]
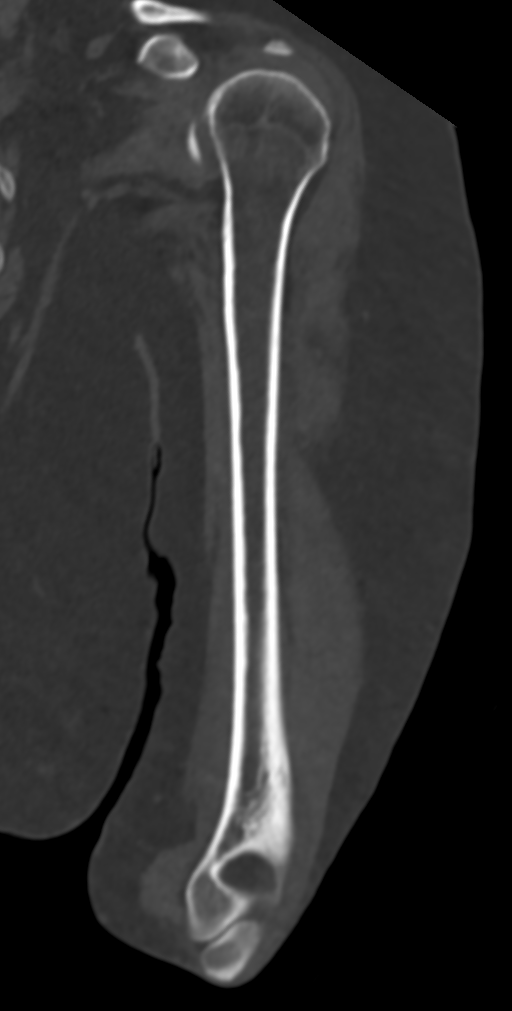

[Series 17: shoulder · sagittal · 0.47mm/px · 6 of 89 slices shown (3 of 3)]
[im 27/89  bone]
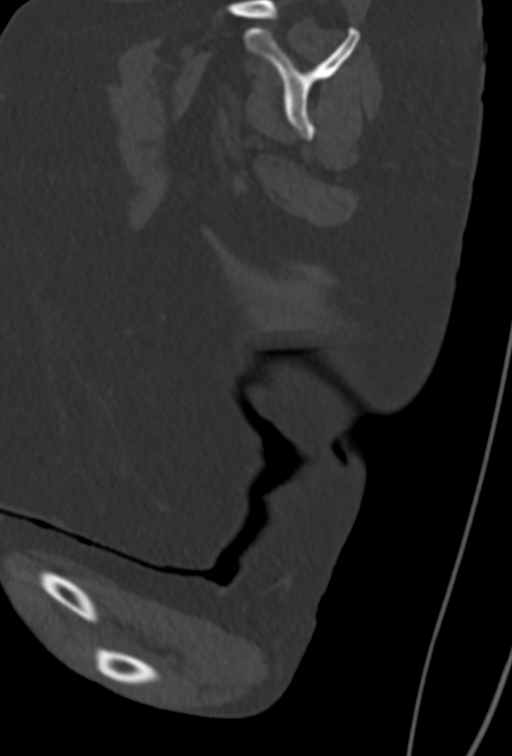
[im 36/89  bone]
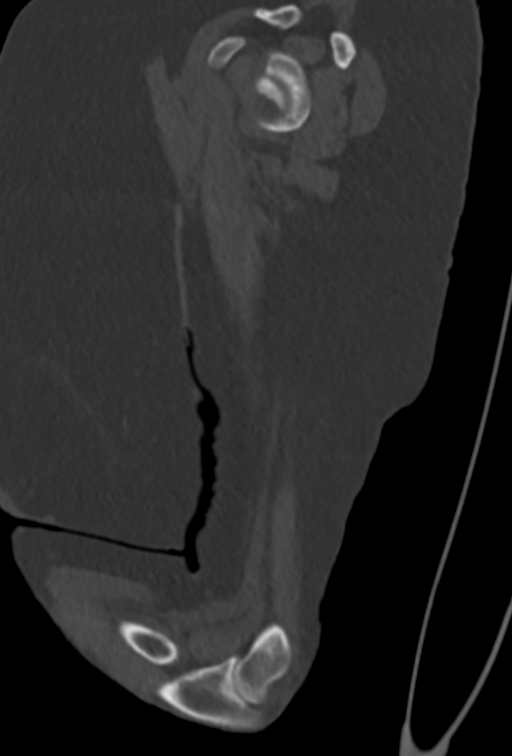
[im 41/89  soft-tissue]
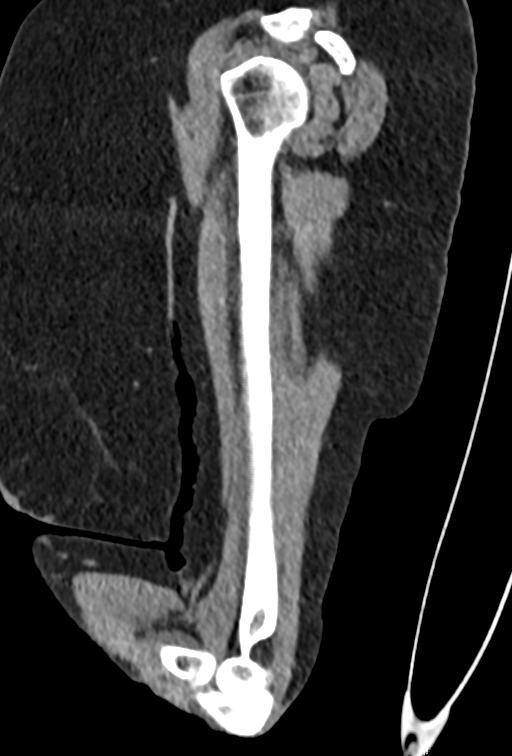
[im 45/89  bone]
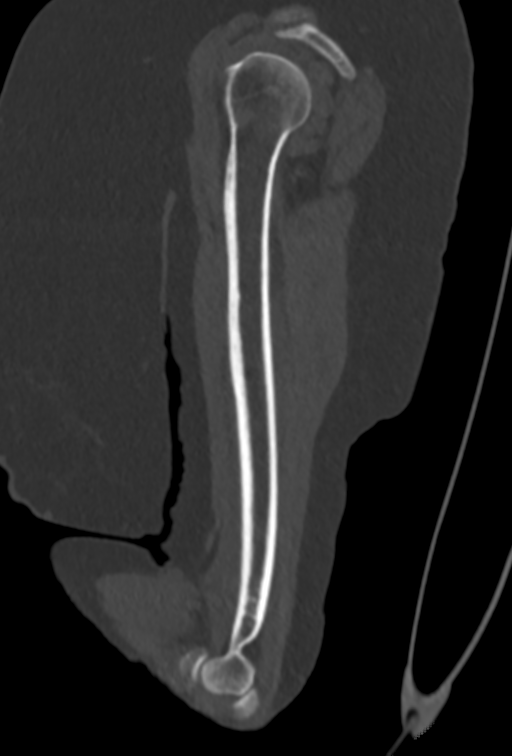
[im 53/89  bone]
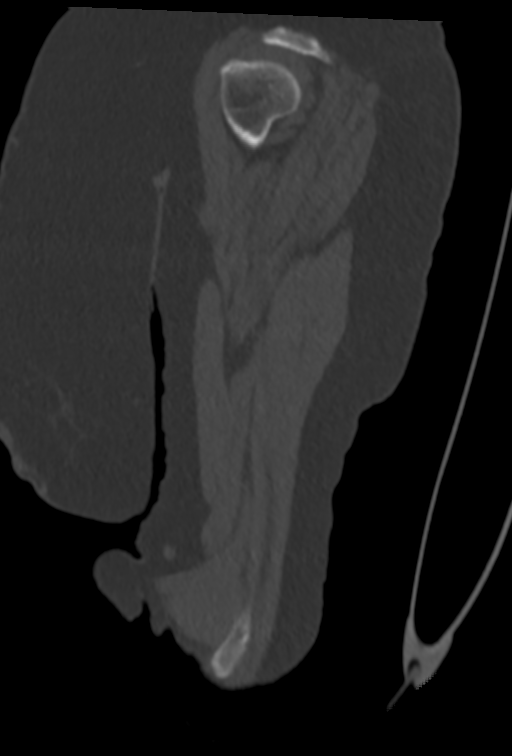
[im 62/89  bone]
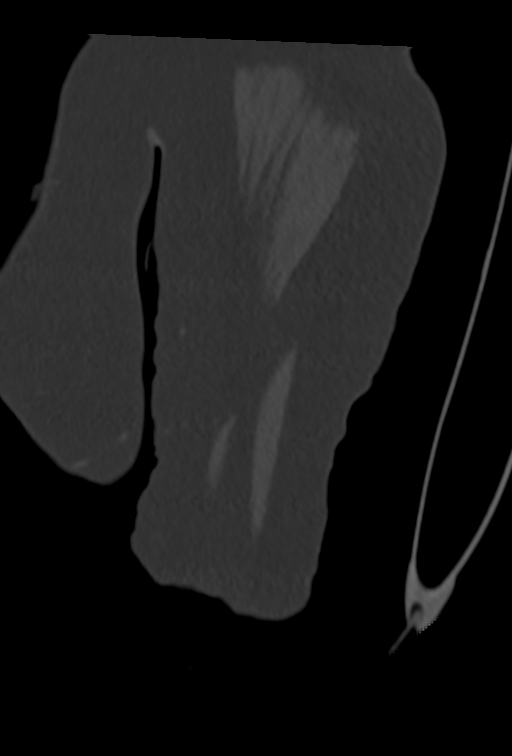

[12 of 36 positions shown; findings below may reference images not displayed]

FINDINGS: Bones/Joint/Cartilage

No evidence of acute fracture or dislocation. There is no
significant shoulder or elbow joint effusion. Mild degenerative
changes are present at the acromioclavicular joint. There is a type
3 acromion.

Ligaments

Suboptimally assessed by CT.

Muscles and Tendons

The muscles of the left upper arm appear normal. No rotator cuff
muscular atrophy identified. The long head of the biceps tendon is
not well visualized.

Soft tissues

No evidence of soft tissue mass, fluid collection or inflammation.
Scout image demonstrates a probable thoracic spinal stimulator and
postsurgical changes from previous cervical and lumbar fusion.
IMPRESSION: 1. No apparent abnormalities in the left upper arm.
2. Musculotendinous and ligamentous evaluation limited by CT.

## 2018-04-27 ENCOUNTER — Ambulatory Visit
Admission: RE | Admit: 2018-04-27 | Discharge: 2018-04-27 | Disposition: A | Discharge status: Home / Self Care | Payer: Medicare Other | Source: Ambulatory Visit | Attending: Thoracic Surgery (Cardiothoracic Vascular Surgery) | Admitting: Thoracic Surgery (Cardiothoracic Vascular Surgery)

## 2018-04-27 ENCOUNTER — Ambulatory Visit
Admission: RE | Admit: 2018-04-27 | Discharge: 2018-04-27 | Disposition: A | Discharge status: Home / Self Care | Payer: Medicare Other | Source: Ambulatory Visit | Attending: Nurse Practitioner | Admitting: Nurse Practitioner

## 2018-04-27 ENCOUNTER — Ambulatory Visit: Payer: Medicare Other

## 2018-04-27 DIAGNOSIS — G8929 Other chronic pain: Secondary | ICD-10-CM | POA: Diagnosis not present

## 2018-04-27 DIAGNOSIS — S72091A Other fracture of head and neck of right femur, initial encounter for closed fracture: Secondary | ICD-10-CM

## 2018-04-27 DIAGNOSIS — I7100 Dissection of unspecified site of aorta: Secondary | ICD-10-CM | POA: Diagnosis present

## 2018-04-27 DIAGNOSIS — M25551 Pain in right hip: Secondary | ICD-10-CM | POA: Insufficient documentation

## 2018-04-27 DIAGNOSIS — G4733 Obstructive sleep apnea (adult) (pediatric): Secondary | ICD-10-CM | POA: Insufficient documentation

## 2018-05-04 IMAGING — CT CT HEAD W/O CM
4 series · 17 of 47 positions shown, 19 images · non-contrast
Comparison: August 15, 2017

CLINICAL DATA: Pain following fall.  Blurred vision

EXAM:
CT HEAD WITHOUT CONTRAST
TECHNIQUE: Contiguous axial images were obtained from the base of the skull
through the vertex without intravenous contrast.

[Series 2: head bone · axial · 0.43mm/px · z∈[-173,-117]mm · 4 of 83 slices shown]
[im 9/83  bone]
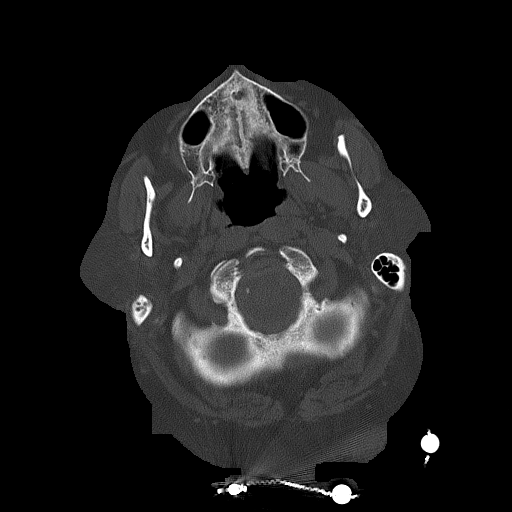
[im 17/83  bone]
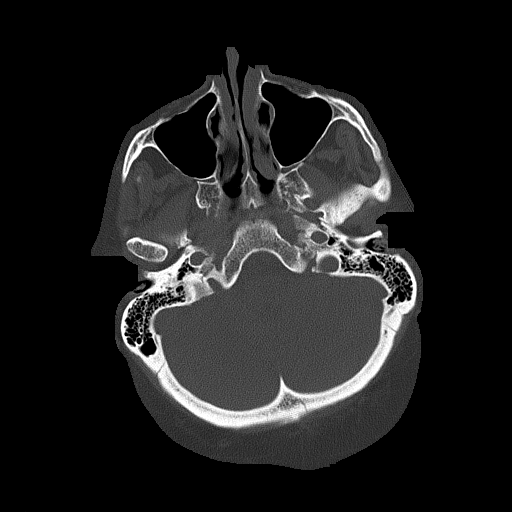
[im 25/83  bone]
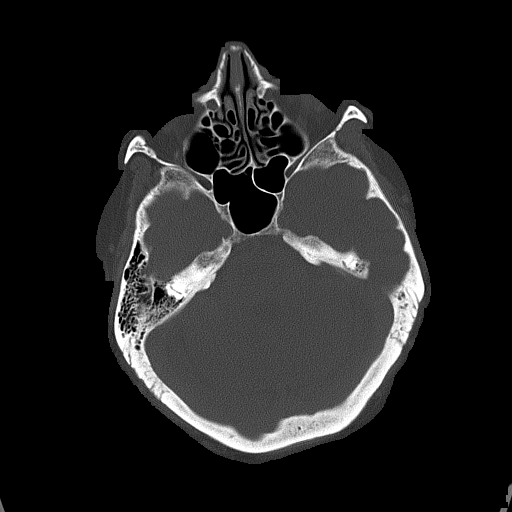
[im 37/83  bone]
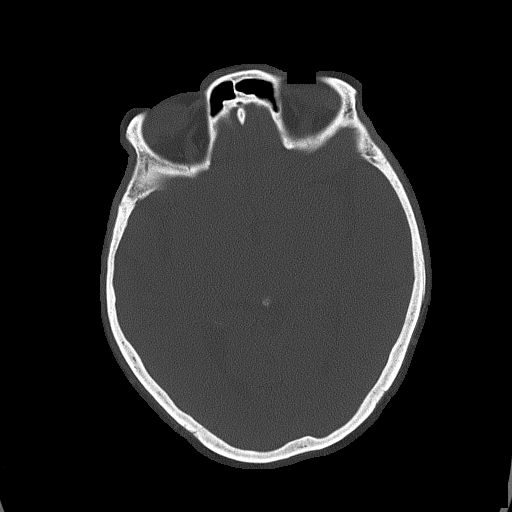

[Series 3: head wo · axial · 0.43mm/px · z∈[-169,-49]mm · 7 of 33 slices shown, 9 images]
[im 5/33  brain]
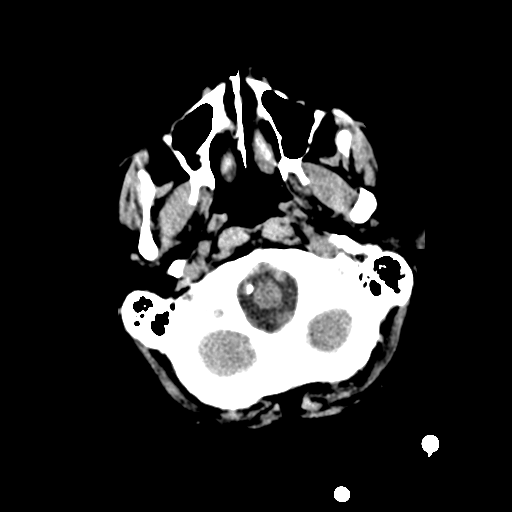
[im 5/33  bone]
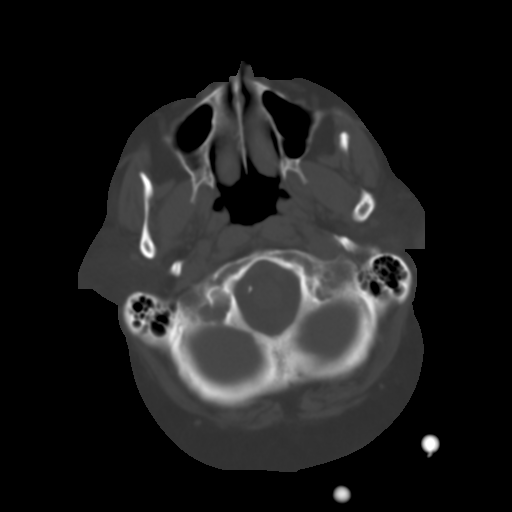
[im 9/33  brain]
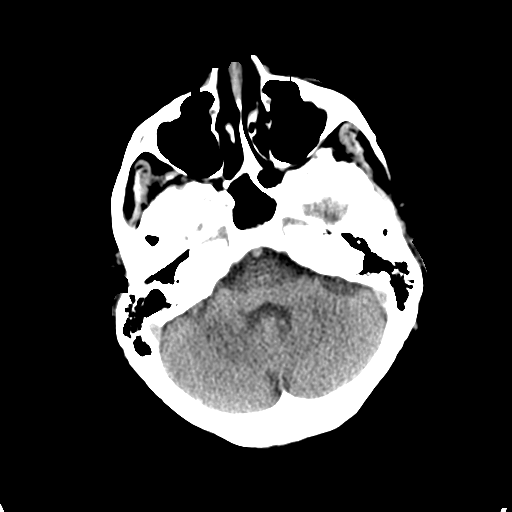
[im 13/33  brain]
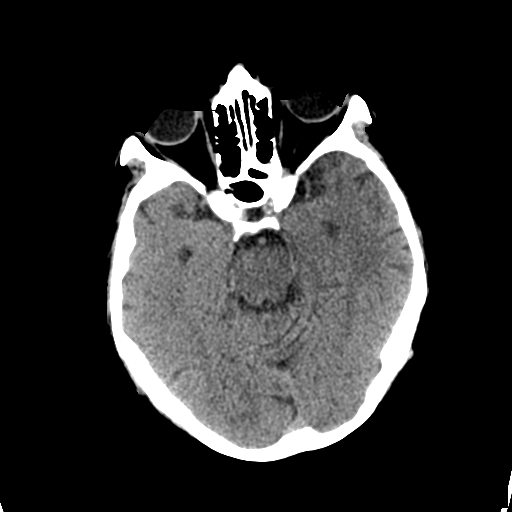
[im 17/33  brain]
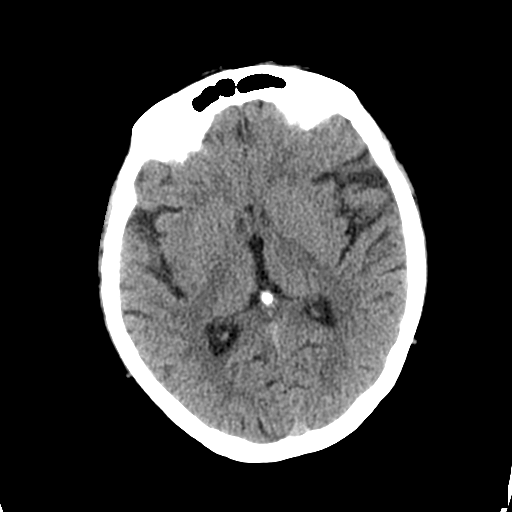
[im 21/33  brain]
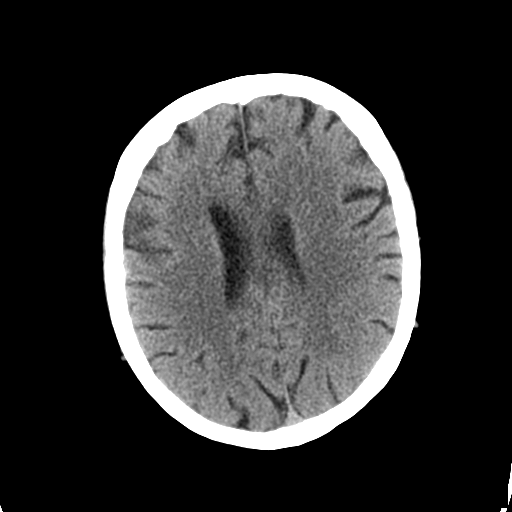
[im 21/33  bone]
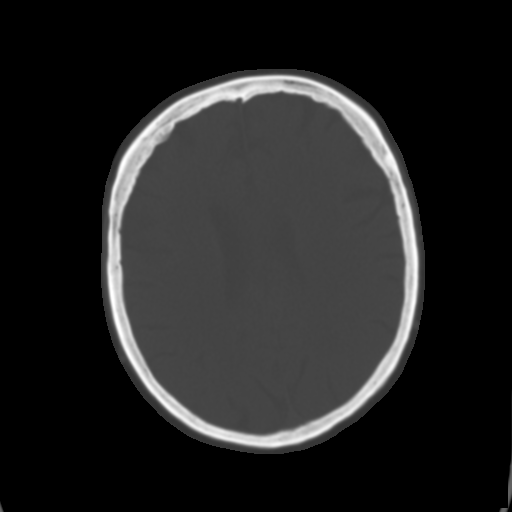
[im 25/33  brain]
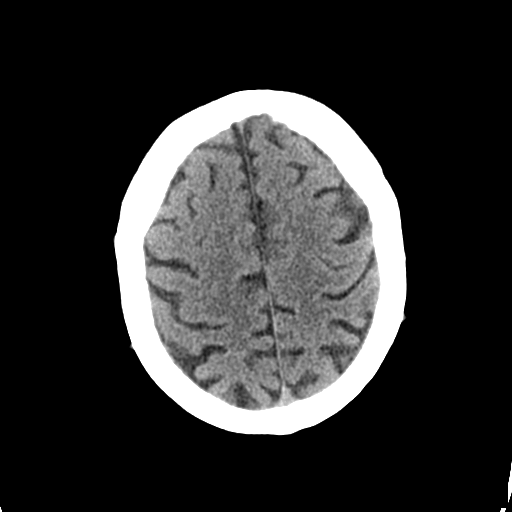
[im 29/33  brain]
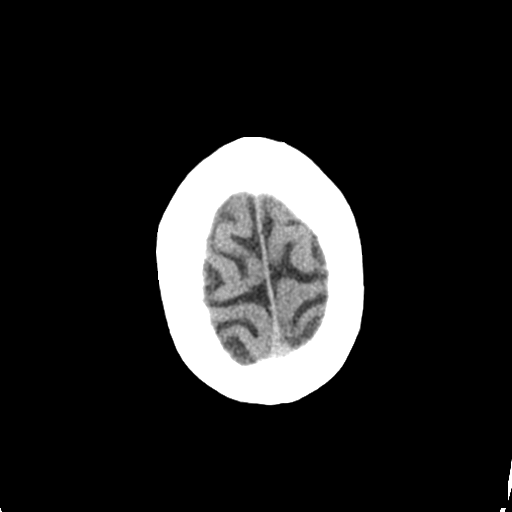

[Series 4: coronal soft tissue · coronal · 0.34mm/px · 3 of 65 slices shown]
[im 22/65  brain]
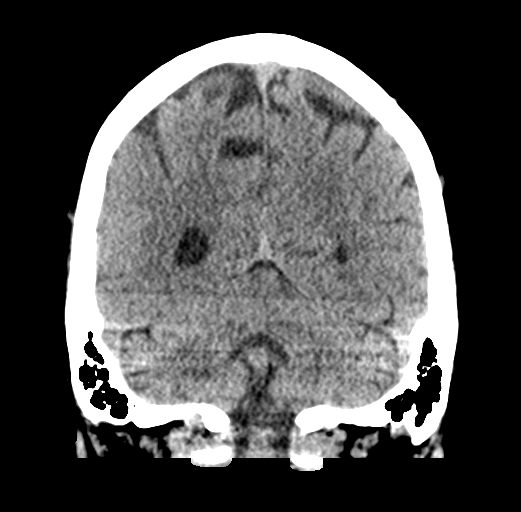
[im 29/65  brain]
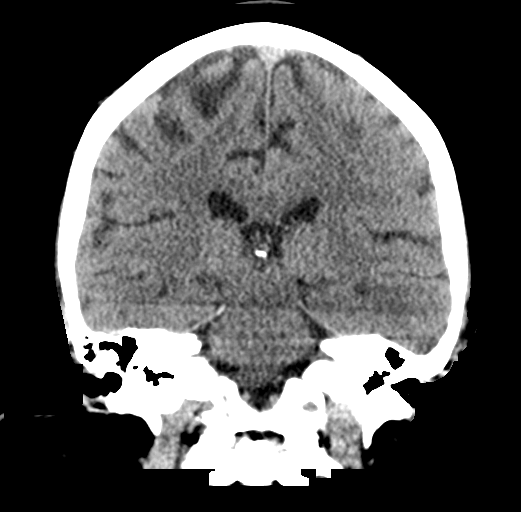
[im 36/65  brain]
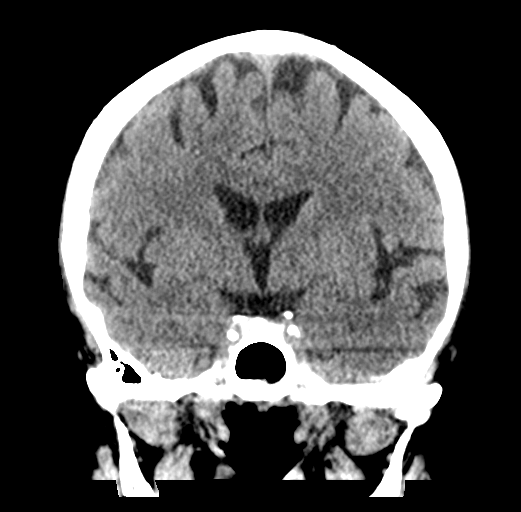

[Series 5: sagittal soft tissue · sagittal · 0.37mm/px · 3 of 49 slices shown]
[im 17/49  brain]
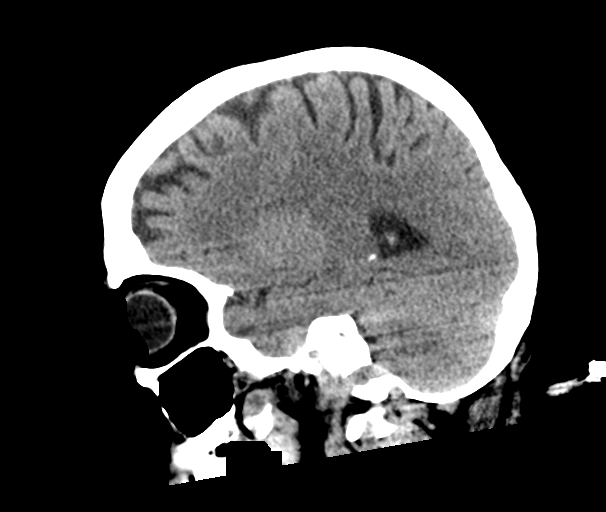
[im 25/49  brain]
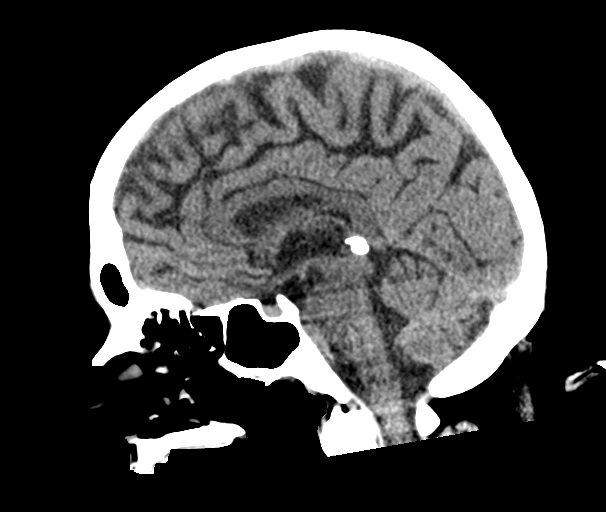
[im 33/49  brain]
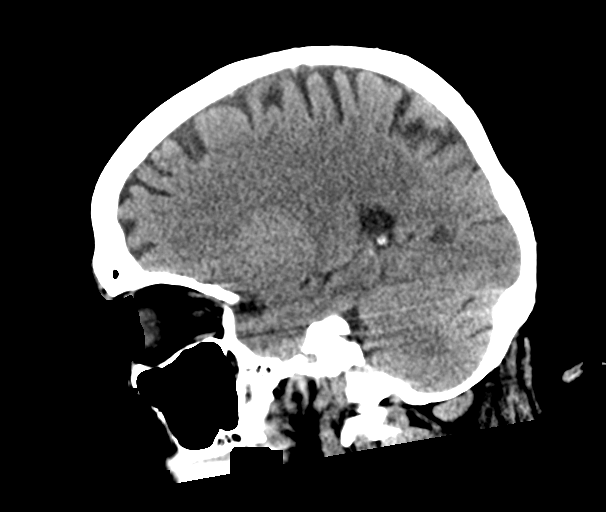

[17 of 47 positions shown; findings below may reference images not displayed]

FINDINGS: Brain: The ventricles are normal in size and configuration. There is
no intracranial mass, hemorrhage, extra-axial fluid collection, or
midline shift. There is rather minimal small vessel disease in the
centra semiovale bilaterally. Elsewhere gray-white compartments
appear normal. No evident acute infarct.

Vascular: No hyperdense vessel. There is calcification in the distal
right vertebral artery and in each carotid siphon.

Skull: There are no blastic or lytic bone lesions.

Sinuses/Orbits: There is mucosal thickening in several ethmoid air
cells. Other paranasal sinuses are clear. Orbits appear symmetric
bilaterally.

Other: Mastoid air cells are clear. There is debris in each external
auditory canal.
IMPRESSION: Minimal periventricular small vessel disease. No evident acute
infarct. No mass or hemorrhage.

There are foci of arterial vascular calcification. There is mucosal
thickening in several ethmoid air cells. There is probable cerumen
in each external auditory canal.

## 2018-05-04 IMAGING — CR DG PELVIS 1-2V
1 series · 1 of 1 positions shown · non-contrast
Comparison: None.

CLINICAL DATA: Fall, landed on bottom.

EXAM:
PELVIS - 1-2 VIEW

[dg pelvis 1-2 views]
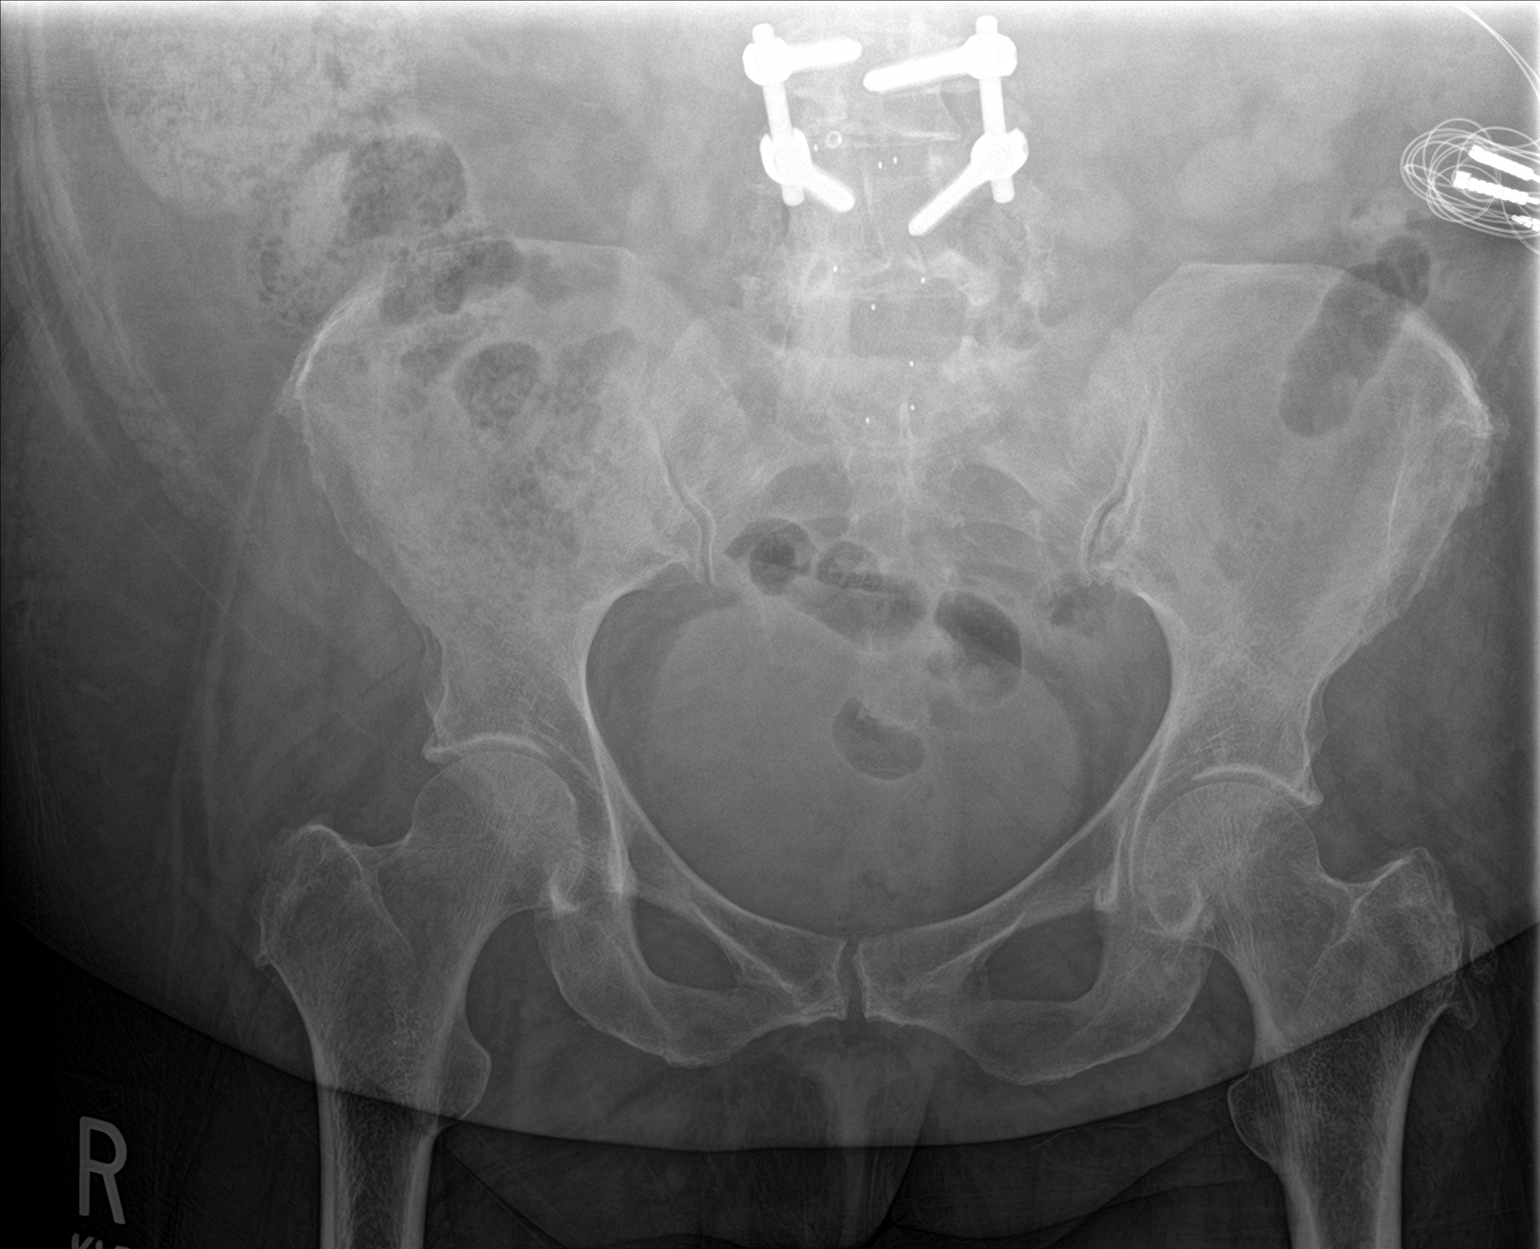

[1 of 1 positions shown; findings below may reference images not displayed]

FINDINGS: Early degenerative changes in the hips bilaterally with joint space
narrowing and early spurring. SI joints are symmetric and
unremarkable. No acute bony abnormality. Specifically, no fracture,
subluxation, or dislocation.
IMPRESSION: No acute bony abnormality.

## 2018-05-04 IMAGING — CR DG KNEE COMPLETE 4+V*R*
1 series · 4 of 4 positions shown · non-contrast
Comparison: Right knee x-rays dated December 09, 2016.

CLINICAL DATA: Right knee pain after fall.

EXAM:
RIGHT KNEE - COMPLETE 4+ VIEW

[Series 1: dg knee complete 4 views right · 0.14mm/px · 4 of 4 slices shown]
[im 1/4]
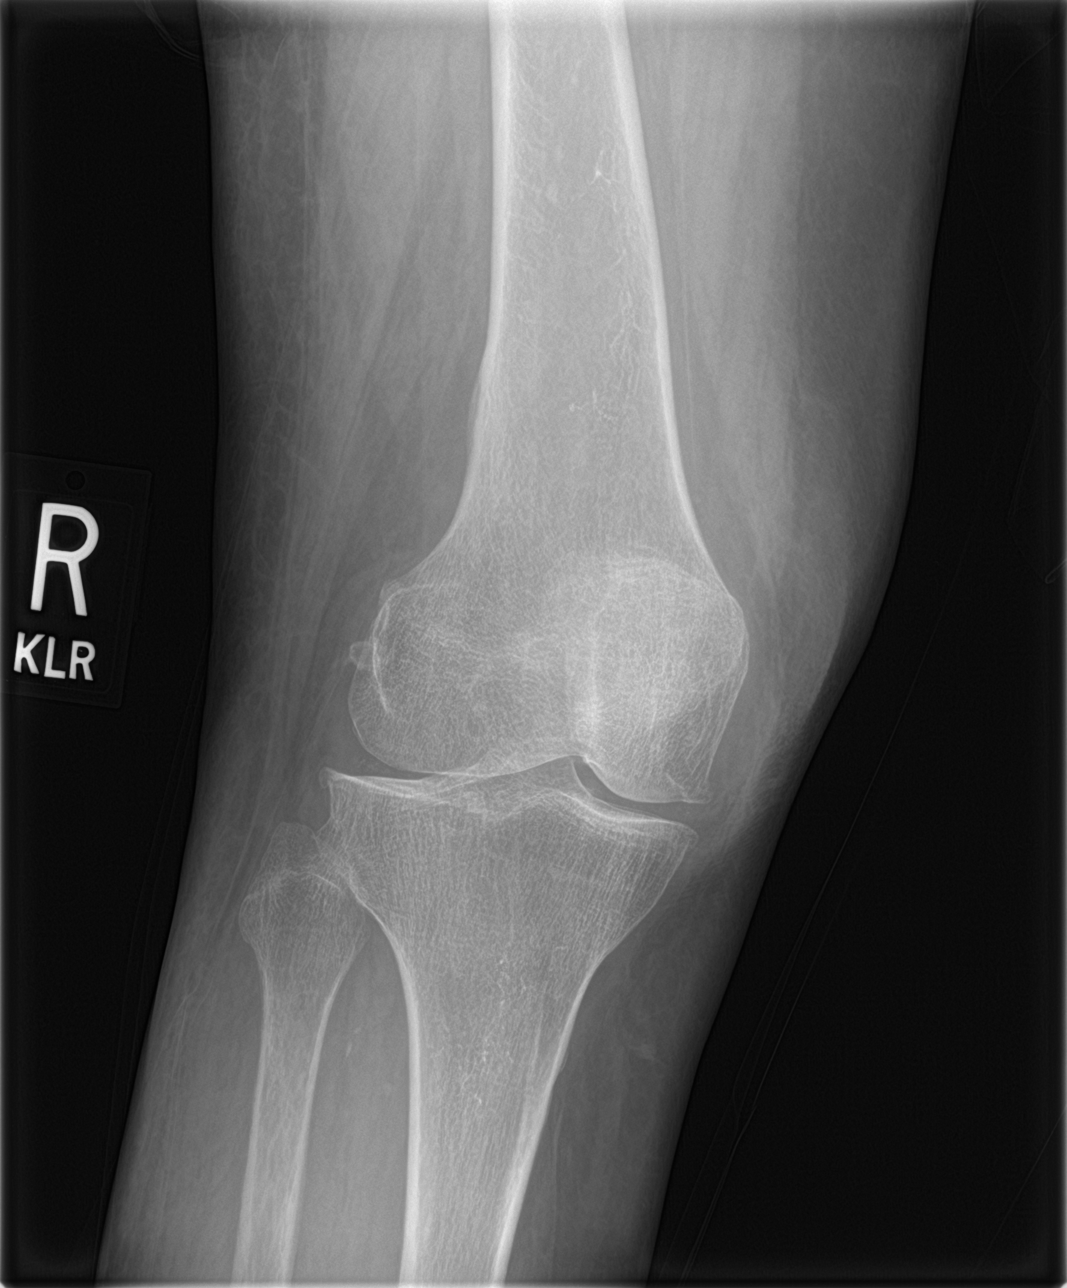
[im 2/4]
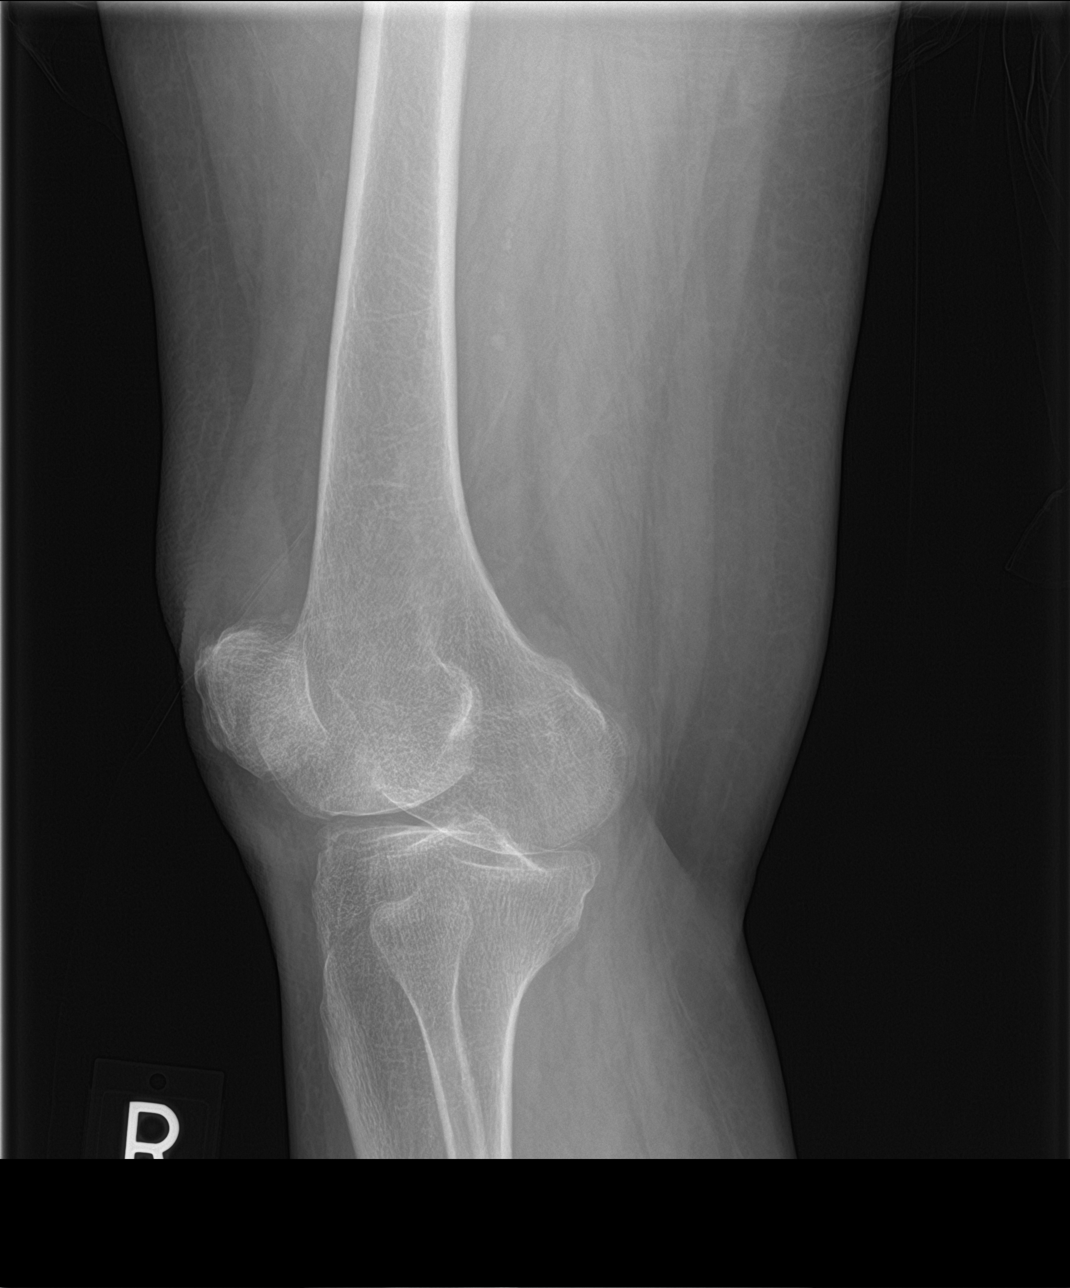
[im 3/4]
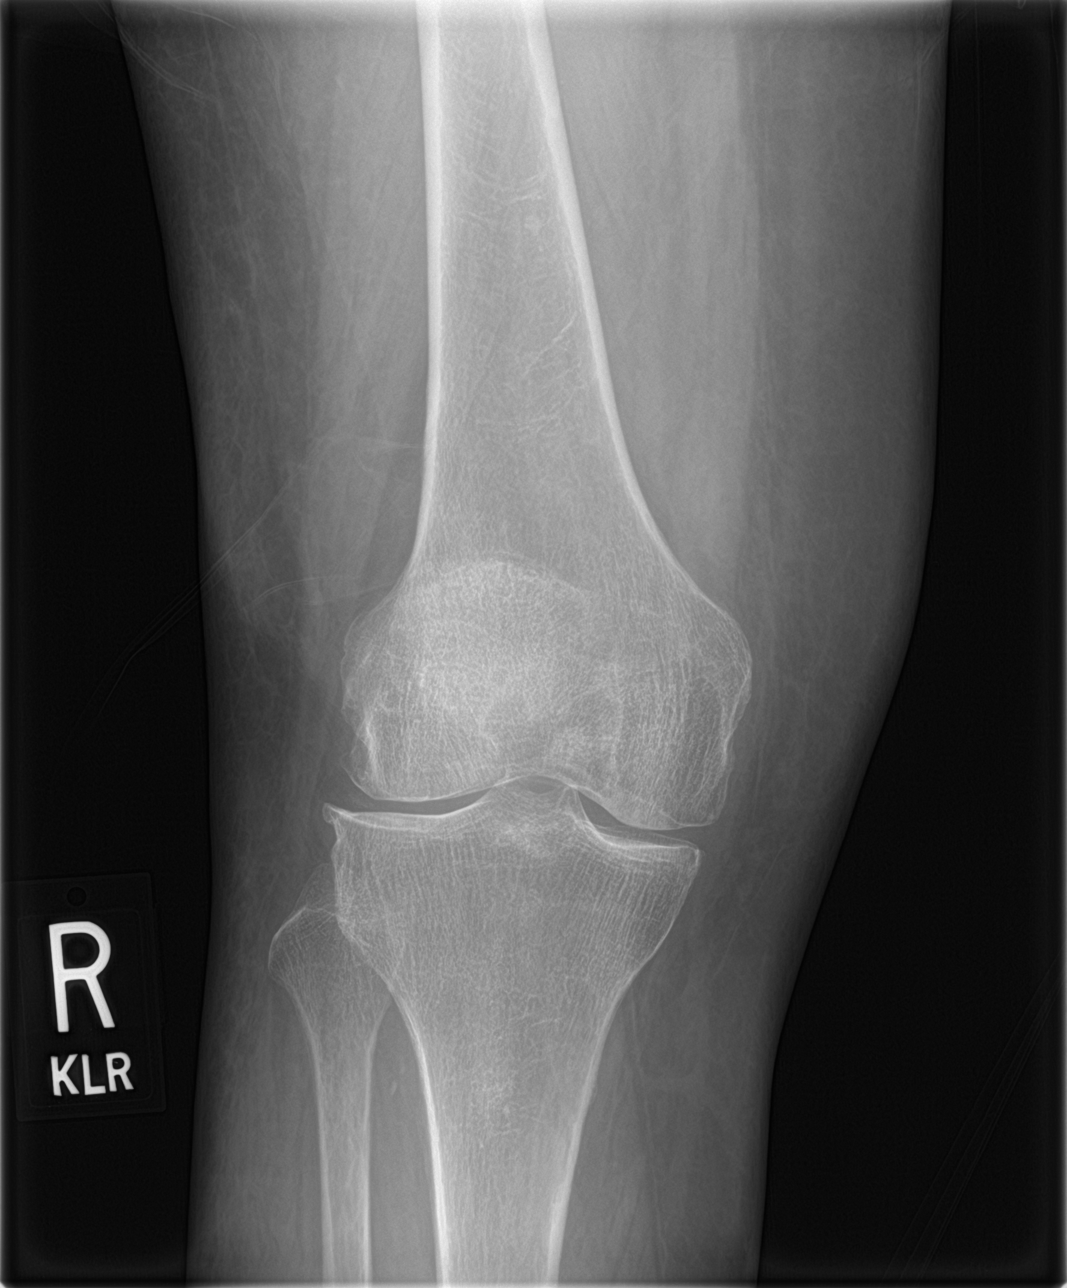
[im 4/4]
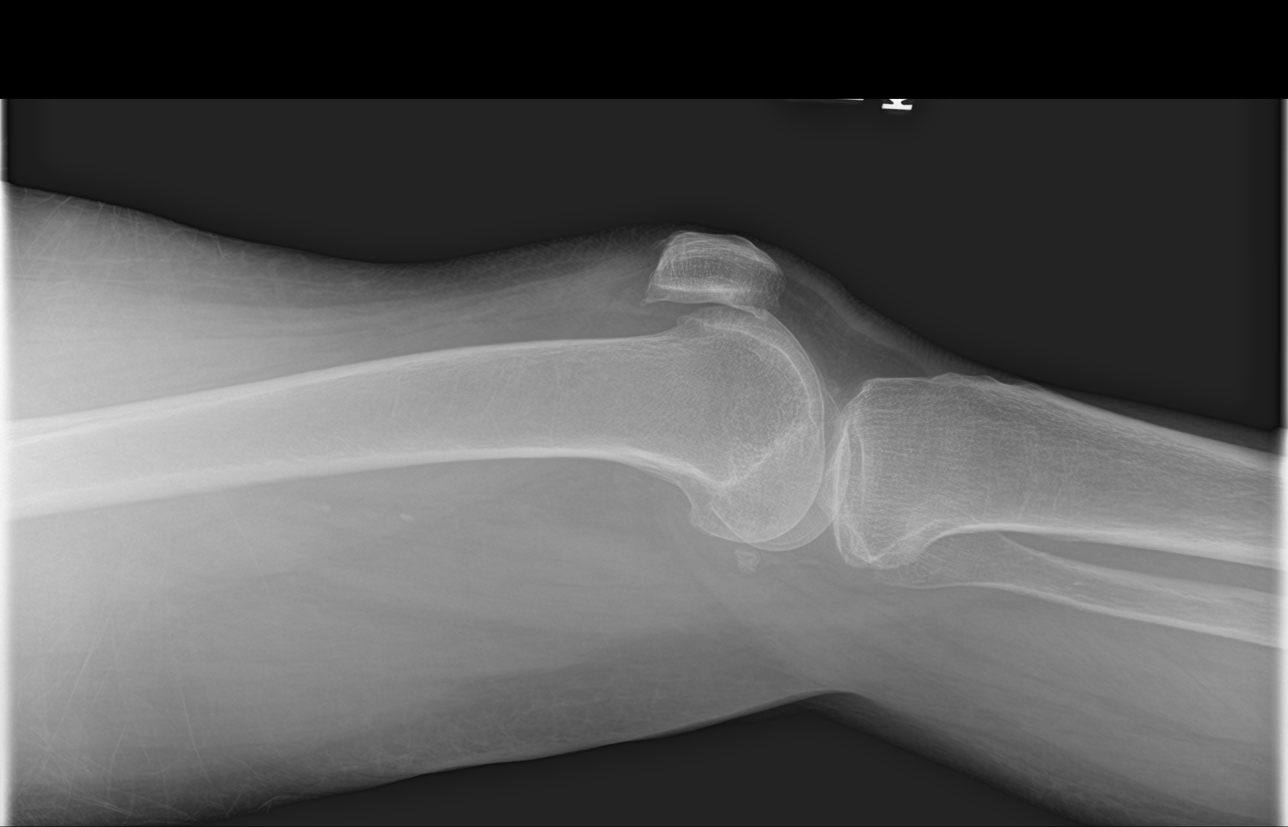

[4 of 4 positions shown; findings below may reference images not displayed]

FINDINGS: No acute fracture or dislocation. No joint effusion. Mild
tricompartmental osteoarthritis, similar to prior study.
Chondrocalcinosis of the menisci. Osteopenia. Soft tissues are
unremarkable.
IMPRESSION: 1.  No acute osseous abnormality.

## 2018-05-09 ENCOUNTER — Ambulatory Visit: Payer: Medicaid Other | Admitting: Licensed Clinical Social Worker

## 2018-05-10 ENCOUNTER — Other Ambulatory Visit: Payer: Self-pay

## 2018-05-10 ENCOUNTER — Encounter: Payer: Self-pay | Admitting: Psychiatry

## 2018-05-10 ENCOUNTER — Ambulatory Visit (INDEPENDENT_AMBULATORY_CARE_PROVIDER_SITE_OTHER): Payer: Medicare Other | Admitting: Psychiatry

## 2018-05-10 VITALS — BP 87/55 | HR 76 | Temp 98.0°F

## 2018-05-10 DIAGNOSIS — F411 Generalized anxiety disorder: Secondary | ICD-10-CM | POA: Diagnosis not present

## 2018-05-10 DIAGNOSIS — F5105 Insomnia due to other mental disorder: Secondary | ICD-10-CM | POA: Diagnosis not present

## 2018-05-10 DIAGNOSIS — F3130 Bipolar disorder, current episode depressed, mild or moderate severity, unspecified: Secondary | ICD-10-CM | POA: Diagnosis not present

## 2018-05-10 MED ORDER — ZOLPIDEM TARTRATE 10 MG PO TABS: 10.0000 mg | ORAL_TABLET | Freq: Every evening | ORAL | 3 refills | Status: DC | PRN

## 2018-05-10 NOTE — Progress Notes (Signed)
[place] MD  OP Progress Note  05/10/2018 11:21 AM Stephanie [name]  MRN:  [id number]  Chief Complaint: ' I am here for follow up." Chief Complaint    Follow-up     HPI: Stephanie [name] is a 67 year old Caucasian female, divorced, lives at [place], on [place], has a history of bipolar disorder, generalized anxiety disorder, RLS, chronic pain, insomnia as well as multiple medical problems including COPD, history of lung nodules, sleep apnea, ascites, status post cholecystectomy, history of hepatitis B, history of stroke, history of vitamin D deficiency, history of aortic dissection, hyperlipidemia, hypertension, thrombocytopenia, chronic anemia, presented to the clinic today for a follow-up visit.  Patient today presented along with staff at [place].   Pt today reports that she is currently doing better with regards to her mood symptoms.  She denies any significant crying spells or anxiety symptoms.  She reports appetite is good.  Reports she has been able to walk with a walker and that has also helped her to feel better.  She however continues to struggle with sleep.  She reports she takes Ambien 5 mg which helps some days.  She was prescribed Pamelor in the past but it is not on the medication list that she brought in from the ALF.  Unsure when this was discontinued.  Patient as well as a staff who came with her could not give information about why or when it was discontinued.  Discussed with patient that her Ambien can be increased to 10 mg.  She agrees with plan.  Patient does have some tremors of her upper and lower extremities.  Will refer her to neurology for the same.  Patient denies any suicidality.  Patient denies any perceptual disturbances.  Patient reports she had a good Thanksgiving holiday with her friends.  Patient denies any other concerns today.  Visit Diagnosis:    ICD-10-CM   1. Bipolar I disorder, most recent episode depressed ([place]) F31.30    improving  2. GAD (generalized anxiety  disorder) F41.1    improving  3. Insomnia due to mental disorder F51.05     Past Psychiatric History: Reviewed past psychiatric history from my progress note on 08/24/2017.  Past trials of Seroquel, trazodone, Ambien  Past Medical History:  Past Medical History:  Diagnosis Date  . Adenomatous colon polyp 08/27/2014   Polyps x 3  . Anemia   . Anxiety   . 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 Arthritis   . Asthma   . Bipolar affective disorder ([place])    h/o  . Blood transfusion 2000  . Blood transfusion without reported diagnosis   . Cancer ([place])   . Chronic abdominal pain    abdominal wall pain  . 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 Colon polyp   . COPD (chronic obstructive pulmonary disease) ([place])   . Depression   . Diabetes mellitus without complication ([place])   . Fatty liver 04/09/08   found in abd CT  . GERD (gastroesophageal reflux disease)   . H/O: CVA (cardiovascular accident)    TIA  . H/O: rheumatic fever   . Hepatitis    HEP "B" twenty years ago  . Hyperlipidemia   . Hypertension   . IBS (irritable bowel syndrome)   . Incontinence   . Insomnia   . Lower extremity edema   . Neuropathy    lower legs D/T DM  . Obstructive sleep apnea    Use C-PAP twice per week  .  Platelets decreased ([place])   . RLS (restless legs syndrome)   . Shortness of breath   . Sleep apnea   . Stroke [facility])    TIA- 2002  . Ulcer   . Unspecified disorders of nervous system   . Wears dentures    full upper and lower    Past Surgical History:  Procedure Laterality Date  . ABDOMINAL EXPLORATION SURGERY    . ABDOMINAL HYSTERECTOMY     total  . Axillary artery cannulation via 8-mm Hemashield graft, median sternotomy, extracorporeal circulation with deep hypothermic circulatory arrest, repair of  aortic dessection  01/23/2009   Dr Arlyce Dice  . BACK SURGERY      x 5  . cataract     both eyes  . CHOLECYSTECTOMY    . COLONOSCOPY    . EYE SURGERY Bilateral    Cataract Extraction with  IOL  . FACIAL LACERATION REPAIR Left 04/11/2018   Procedure: EARLOBE REPAIR;  Surgeon: Carloyn Manner, MD;  Location: [facility];  Service: ENT;  Laterality: Left;  Diabetic - insulin and oral meds sleep apnea  . HEMORROIDECTOMY     and colon polyp removed  . KNEE ARTHROSCOPY WITH LATERAL RELEASE Right 11/15/2016   Procedure: KNEE ARTHROSCOPY WITH LATERAL RELEASE;  Surgeon: Corky Mull, MD;  Location: [facility];  Service: Orthopedics;  Laterality: Right;  . LIVER BIOPSY  04/10/2012   Procedure: LIVER BIOPSY;  Surgeon: Inda Castle, MD;  Location: [facility];  Service: Endoscopy;  Laterality: N/A;  ultrasound to mark order for abd limited/liver to be marked to be sent by [email or web address]   . LUMBAR FUSION  10/09  . LUMBAR WOUND DEBRIDEMENT  05/09/2011   Procedure: LUMBAR WOUND DEBRIDEMENT;  Surgeon: Dahlia Bailiff;  Location: [facility];  Service: Orthopedics;  Laterality: N/A;  IRRIGATION AND DEBRIDEMENT SPINAL WOUND  . POSTERIOR CERVICAL FUSION/FORAMINOTOMY    . ROTATOR CUFF REPAIR     left    Family Psychiatric History: I have reviewed family psychiatric history from my progress note on 08/24/2017.  Family History:  Family History  Problem Relation Age of Onset  . Breast cancer Mother   . Lung cancer Mother   . Stomach cancer Father   . Diabetes Unknown        4 aunts, and 1 uncle  . Depression Brother   . Anxiety disorder Brother   . Colon cancer Neg Hx   . Esophageal cancer Neg Hx   . Rectal cancer Neg Hx     Social History: I have reviewed social history from my progress note on 08/24/2017. Social History   Socioeconomic History  . Marital status: Divorced    Spouse name: Not on file  . Number of children: 2  . Years of education: Not on file  . Highest education level: 8th grade  Occupational History  . Occupation: Disabled    Employer: [employer]  Social Needs  . Financial resource strain: Not hard at all  . Food insecurity:    Worry: Never true     Inability: Never true  . Transportation needs:    Medical: No    Non-medical: No  Tobacco Use  . Smoking status: Former Smoker    Packs/day: 1.50    Years: 50.00    Pack years: 75.00    Types: Cigarettes    Last attempt to quit: 04/06/2013    Years since quitting: 5.0  . Smokeless tobacco: Never Used  Substance and Sexual Activity  . Alcohol use:  No    Alcohol/week: 0.0 standard drinks  . Drug use: No  . Sexual activity: Never  Lifestyle  . Physical activity:    Days per week: 0 days    Minutes per session: 0 min  . Stress: Very much  Relationships  . Social connections:    Talks on phone: Not on file    Gets together: Not on file    Attends religious service: Never    Active member of club or organization: No    Attends meetings of clubs or organizations: Never    Relationship status: Divorced  Other Topics Concern  . Not on file  Social History Narrative   1 son, 71+ y/o   Daily caffeine use: 2 cups daily   Does not get regular exercise   Disability due to bipolar      Has a living will- she is a DNR (she has form at home per pt).    Allergies:  Allergies  Allergen Reactions  . Doxycycline Hives, Swelling, Other (See Comments) and Nausea Only    Reaction:  Facial swelling  Face red and swollen; no difficulty breathing    Metabolic Disorder Labs: Lab Results  Component Value Date   HGBA1C 5.2 07/20/2016   MPG 103 07/20/2016   MPG 131 04/18/2016   No results found for: PROLACTIN Lab Results  Component Value Date   CHOL 194 04/05/2016   TRIG 47 04/05/2016   HDL 110 04/05/2016   CHOLHDL 1.8 04/05/2016   VLDL 9 04/05/2016   LDLCALC 75 04/05/2016   LDLCALC 59 12/15/2014   Lab Results  Component Value Date   TSH 4.486 07/19/2017   TSH 4.24 12/15/2014    Therapeutic Level Labs: No results found for: LITHIUM No results found for: VALPROATE No components found for:  CBMZ  Current Medications: Current Outpatient Medications  Medication Sig  Dispense Refill  . acetaminophen (TYLENOL) 325 MG tablet Take 650 mg by mouth every 6 (six) hours as needed for mild pain.    Marland Kitchen albuterol (PROVENTIL HFA;VENTOLIN HFA) 108 (90 Base) MCG/ACT inhaler Inhale 2 puffs into the lungs every 6 (six) hours as needed for wheezing or shortness of breath.    Marland Kitchen albuterol (PROVENTIL) (2.5 MG/3ML) 0.083% nebulizer solution Take 2.5 mg by nebulization every 6 (six) hours as needed for wheezing or shortness of breath.    . bacitracin 500 UNIT/GM ointment Apply 1 application topically 2 (two) times daily. 15 g 0  . budesonide-formoterol (SYMBICORT) 160-4.5 MCG/ACT inhaler Inhale 2 puffs into the lungs 2 (two) times daily.     Marland Kitchen buPROPion (WELLBUTRIN XL) 150 MG 24 hr tablet Take 1 tablet (150 mg total) by mouth daily. 90 tablet 1  . clonazePAM (KLONOPIN) 0.5 MG tablet Take 0.5 mg by mouth 3 (three) times daily.     Marland Kitchen dicyclomine (BENTYL) 10 MG capsule Take 10 mg by mouth 3 (three) times daily.     . ferrous sulfate 325 (65 FE) MG tablet Take 325 mg by mouth 2 (two) times daily.     . furosemide (LASIX) 80 MG tablet Take 80 mg by mouth.    . gabapentin (NEURONTIN) 600 MG tablet Take 1 tablet (600 mg total) by mouth 3 (three) times daily. 90 tablet 2  . hydrochlorothiazide (MICROZIDE) 12.5 MG capsule Take 12.5 mg by mouth daily with supper.    Marland Kitchen HYDROcodone-acetaminophen (NORCO) 7.5-325 MG tablet Take 1 tablet by mouth every 6 (six) hours as needed for moderate pain.    Marland Kitchen  insulin glargine (LANTUS) 100 UNIT/ML injection Inject 50 Units into the skin every evening.     . insulin lispro (HUMALOG) 100 UNIT/ML injection Inject 8 Units into the skin 3 (three) times daily before meals.     . lactulose (CHRONULAC) 10 GM/15ML solution Take 10 g by mouth daily. (0800)    . lamoTRIgine (LAMICTAL) 100 MG tablet Take 1 tablet (100 mg total) by mouth 2 (two) times daily. (0800 & 2000) 180 tablet 1  . lamoTRIgine (LAMICTAL) 25 MG tablet Take 1 tablet (25 mg total) by mouth 2 (two)  times daily. To be added to the Lamictal 100 mg at 8 AM and 8 pm 180 tablet 1  . lidocaine (LIDODERM) 5 % Place 2 patches onto the skin daily. Apply 1 patch to 2 areas on back (2 patches) every morning and remove at bedtime.    . liraglutide (VICTOZA) 18 MG/3ML SOPN Inject 1.8 mg into the skin daily.    Marland Kitchen LYRICA 150 MG capsule Take 150 mg by mouth 3 (three) times daily. (0800, 1400 & 2000)    . metFORMIN (GLUCOPHAGE-XR) 500 MG 24 hr tablet Take 500 mg by mouth twice daily.    . metoCLOPramide (REGLAN) 5 MG tablet Take 5 mg by mouth 4 (four) times daily -  before meals and at bedtime. (0800, 1200, 1600 & 2000)    . MYRBETRIQ 25 MG TB24 tablet     . nystatin (MYCOSTATIN/NYSTOP) powder Apply topically 4 (four) times daily. (Patient taking differently: Apply topically 4 (four) times daily. Apply to inner thigh/ peri area twice daily and apply to abdomen 4 times daily.) 15 g 0  . omeprazole (PRILOSEC) 20 MG capsule Take 20 mg by mouth daily.     Marland Kitchen oxybutynin (DITROPAN-XL) 5 MG 24 hr tablet Take 5 mg by mouth daily. (0800)    . RESTASIS 0.05 % ophthalmic emulsion     . rOPINIRole (REQUIP) 3 MG tablet Take 3 mg by mouth at bedtime. (2000)    . senna (GERI-KOT) 8.6 MG tablet Take 1 tablet by mouth daily.    . simvastatin (ZOCOR) 10 MG tablet Take 10 mg by mouth at bedtime.    Marland Kitchen spironolactone (ALDACTONE) 50 MG tablet Take 50 mg by mouth 2 (two) times daily. (0800)    . trolamine salicylate (ASPERCREME) 10 % cream Apply 1 application topically 2 (two) times daily as needed for muscle pain.    Marland Kitchen zolpidem (AMBIEN) 10 MG tablet Take 1 tablet (10 mg total) by mouth at bedtime as needed for sleep. 30 tablet 3   No current facility-administered medications for this visit.      Musculoskeletal: Strength & Muscle Tone: within normal limits Gait & Station: wheelchair bound Patient leans: N/A  Psychiatric Specialty Exam: Review of Systems  Neurological: Positive for tremors.  Psychiatric/Behavioral: The  patient has insomnia.   All other systems reviewed and are negative.   Blood pressure (!) 87/55, pulse 76, temperature 98 F (36.7 C), temperature source Oral.There is no height or weight on file to calculate BMI.  General Appearance: Casual  Eye Contact:  Fair  Speech:  Clear and Coherent  Volume:  Normal  Mood:  Euthymic  Affect:  Appropriate  Thought Process:  Goal Directed and Descriptions of Associations: Intact  Orientation:  Full (Time, Place, and Person)  Thought Content: Logical   Suicidal Thoughts:  No  Homicidal Thoughts:  No  Memory:  Immediate;   Fair Recent;   Fair Remote;   Fair  Judgement:  Fair  Insight:  Fair  Psychomotor Activity:  Tremor  Concentration:  Concentration: Fair and Attention Span: Fair  Recall:  [employer] of Knowledge: Fair  Language: Fair  Akathisia:  No  Handed:  Right  AIMS (if indicated): does have tremors of BL UE and LE   Assets:  Communication Skills Desire for Improvement Social Support  ADL's:  Intact  Cognition: WNL  Sleep:  restless   Screenings: PHQ2-9     Clinical Support from 01/04/2018 in [facility] Clinical Support from 11/16/2017 in [facility] Office Visit from 10/18/2017 in [facility] Procedure visit from 10/04/2017 in [facility] Office Visit from 09/25/2017 in [facility]  PHQ-2 Total Score  0  0  1  0  0       Assessment and Plan: Stephanie [name] is a 41 year old Caucasian female, divorced, on SSD, has a history of bipolar disorder, anxiety disorder, insomnia, RLS, sleep apnea, multiple medical problems like history of stroke, hepatitis B, thrombocytopenia, COPD, ascites, hyperlipidemia, hypertension, aortic dissection, vitamin D deficiency, presented to the clinic today for a follow-up visit.  Patient today  presented with staff at assisted living facility.  Patient reports she is currently making progress with regards to her mood symptoms.  She however continues to struggle with sleep.  We will continue to make medication readjustment.  Patient with tremors of her upper and lower extremities.  Will refer her to neurology.  Plan Bipolar disorder Continue Lamictal 125 mg p.o. twice daily Continue Wellbutrin XL 150 mg p.o. daily  For GAD Klonopin as prescribed as needed. She will continue psychotherapy with Ms. Peacock.  For insomnia Pamelor is no longer on her medication list which she brought back from ALF.  Patient unable to give information about why or when this medication was discontinued. She is on Ambien for sleep. We will increase Ambien to 10 mg p.o. nightly since she continues to struggle with sleep.  For tremors Will refer her to neurology.  Follow-up in clinic in 3 months or sooner if needed.  More than 50 % of the time was spent for psychoeducation and supportive psychotherapy and care coordination. This note was generated in part or whole with voice recognition software. Voice recognition is usually quite accurate but there are transcription errors that can and very often do occur. I apologize for any typographical errors that were not detected and corrected.       Ursula Alert, MD 05/10/2018, 11:21 AM

## 2018-05-24 ENCOUNTER — Telehealth: Payer: Self-pay | Admitting: Pulmonary Disease

## 2018-05-24 DIAGNOSIS — Z9989 Dependence on other enabling machines and devices: Principal | ICD-10-CM

## 2018-05-24 DIAGNOSIS — G4733 Obstructive sleep apnea (adult) (pediatric): Secondary | ICD-10-CM

## 2018-05-24 NOTE — Telephone Encounter (Signed)
PSG 04/27/18 >> AHI 10.2, SpO2 low 77%   Please let her know that her sleep study showed mild obstructive sleep apnea.  Please place order for auto CPAP range 5 to 15 cm H2O with heated humidity and mask of choice.  Reschedule her visit with me from 06/12/18 to have ROV 2 months after getting new CPAP.

## 2018-05-24 NOTE — Telephone Encounter (Signed)
Attempted to call patient today regarding results. I did not receive an answer at time of call. I have left a voicemail message for pt to return call. X1  

## 2018-05-25 NOTE — Telephone Encounter (Signed)
Attempted to call patient today regarding results. I did not receive an answer at time of call. I have left a voicemail message for pt to return call. X2

## 2018-05-28 NOTE — Telephone Encounter (Signed)
Called and spoke with patient regarding results.  Informed the patient of results and recommendations today. Placed order for auto CPAP range 5 to 15 cm H2O with heated humidity and mask of choice. Cancelled pt's ov with VS per his request on 06/12/2018; rescheduled appt for 08/29/2018 at 10:45am Pt verbalized understanding and denied any questions or concerns at this time.  Nothing further needed.

## 2018-06-08 ENCOUNTER — Ambulatory Visit: Payer: Medicaid Other | Admitting: Licensed Clinical Social Worker

## 2018-06-11 ENCOUNTER — Other Ambulatory Visit: Payer: Self-pay | Admitting: *Deleted

## 2018-06-11 DIAGNOSIS — R918 Other nonspecific abnormal finding of lung field: Secondary | ICD-10-CM

## 2018-06-12 ENCOUNTER — Ambulatory Visit: Payer: Medicare Other | Admitting: Pulmonary Disease

## 2018-06-13 ENCOUNTER — Telehealth: Payer: Self-pay | Admitting: Pulmonary Disease

## 2018-06-13 DIAGNOSIS — J4530 Mild persistent asthma, uncomplicated: Secondary | ICD-10-CM

## 2018-06-13 DIAGNOSIS — G4733 Obstructive sleep apnea (adult) (pediatric): Secondary | ICD-10-CM

## 2018-06-13 DIAGNOSIS — Z9989 Dependence on other enabling machines and devices: Principal | ICD-10-CM

## 2018-06-13 NOTE — Telephone Encounter (Signed)
Okay to send order. 

## 2018-06-13 NOTE — Telephone Encounter (Signed)
Called and spoke with patient, she is aware and verbalized understanding. Nothing further needed.

## 2018-06-13 NOTE — Telephone Encounter (Signed)
Called and spoke with patient, she is requesting new supplies and tubing for her nebulizer. VS please advise, thank you.

## 2018-06-26 IMAGING — CR DG LUMBAR SPINE 2-3V
1 series · 3 of 3 positions shown · non-contrast
Comparison: Plain film of the lumbar spine dated 03/21/2015.

CLINICAL DATA: Patient reports multiple falls over past week.
Patient states she "hurts all over my body". Reports diffuse pain to
bilateral shoulders, bilateral hips, upper back, lower back and
right knee.

EXAM:
LUMBAR SPINE - 2-3 VIEW

[Series 1: dg lumbar spine 2-3 views · 0.14mm/px · 3 of 3 slices shown]
[im 1/3]
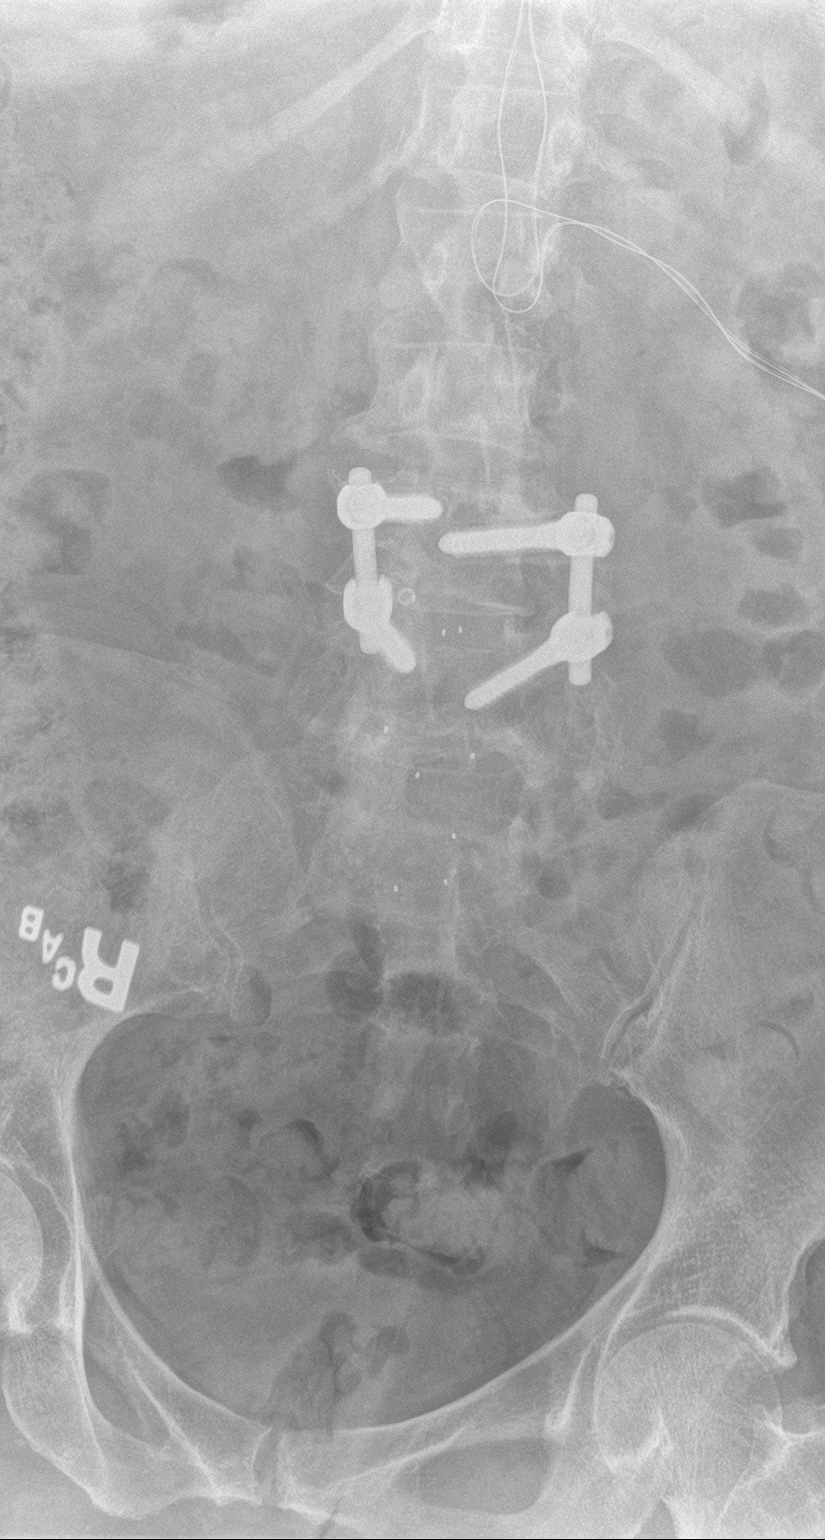
[im 2/3]
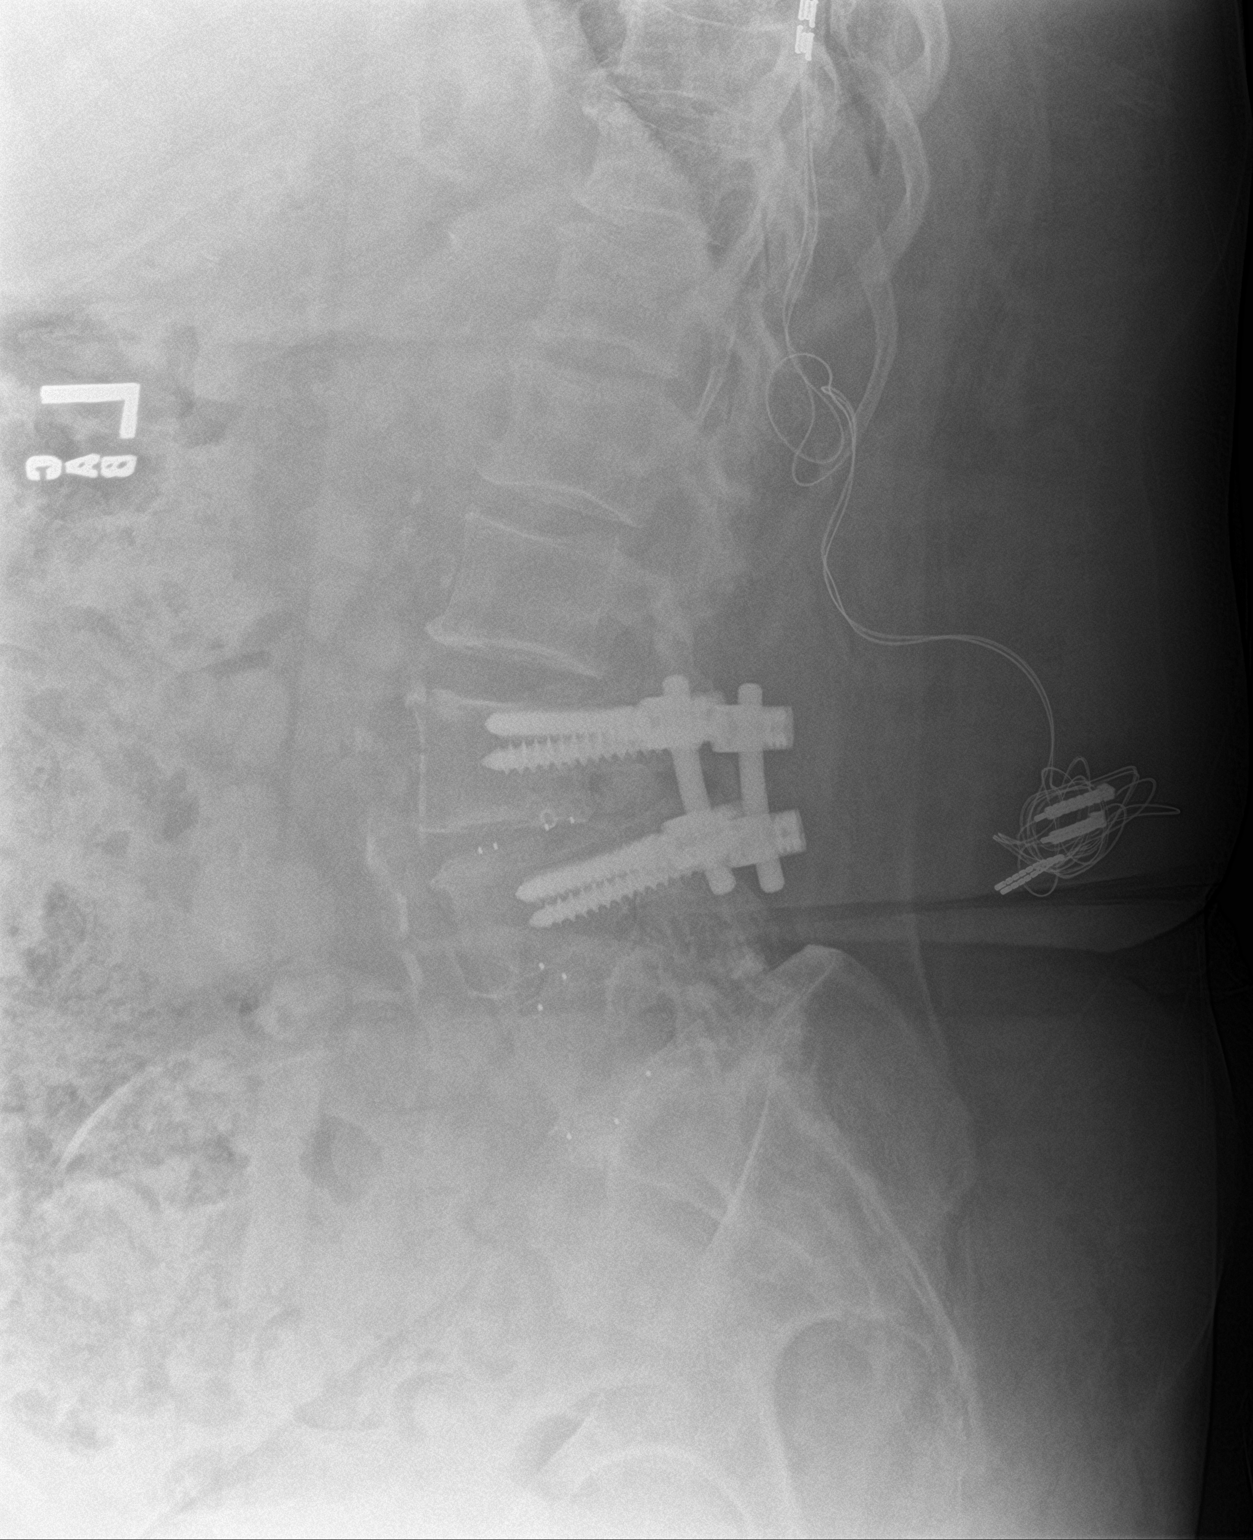
[im 3/3]
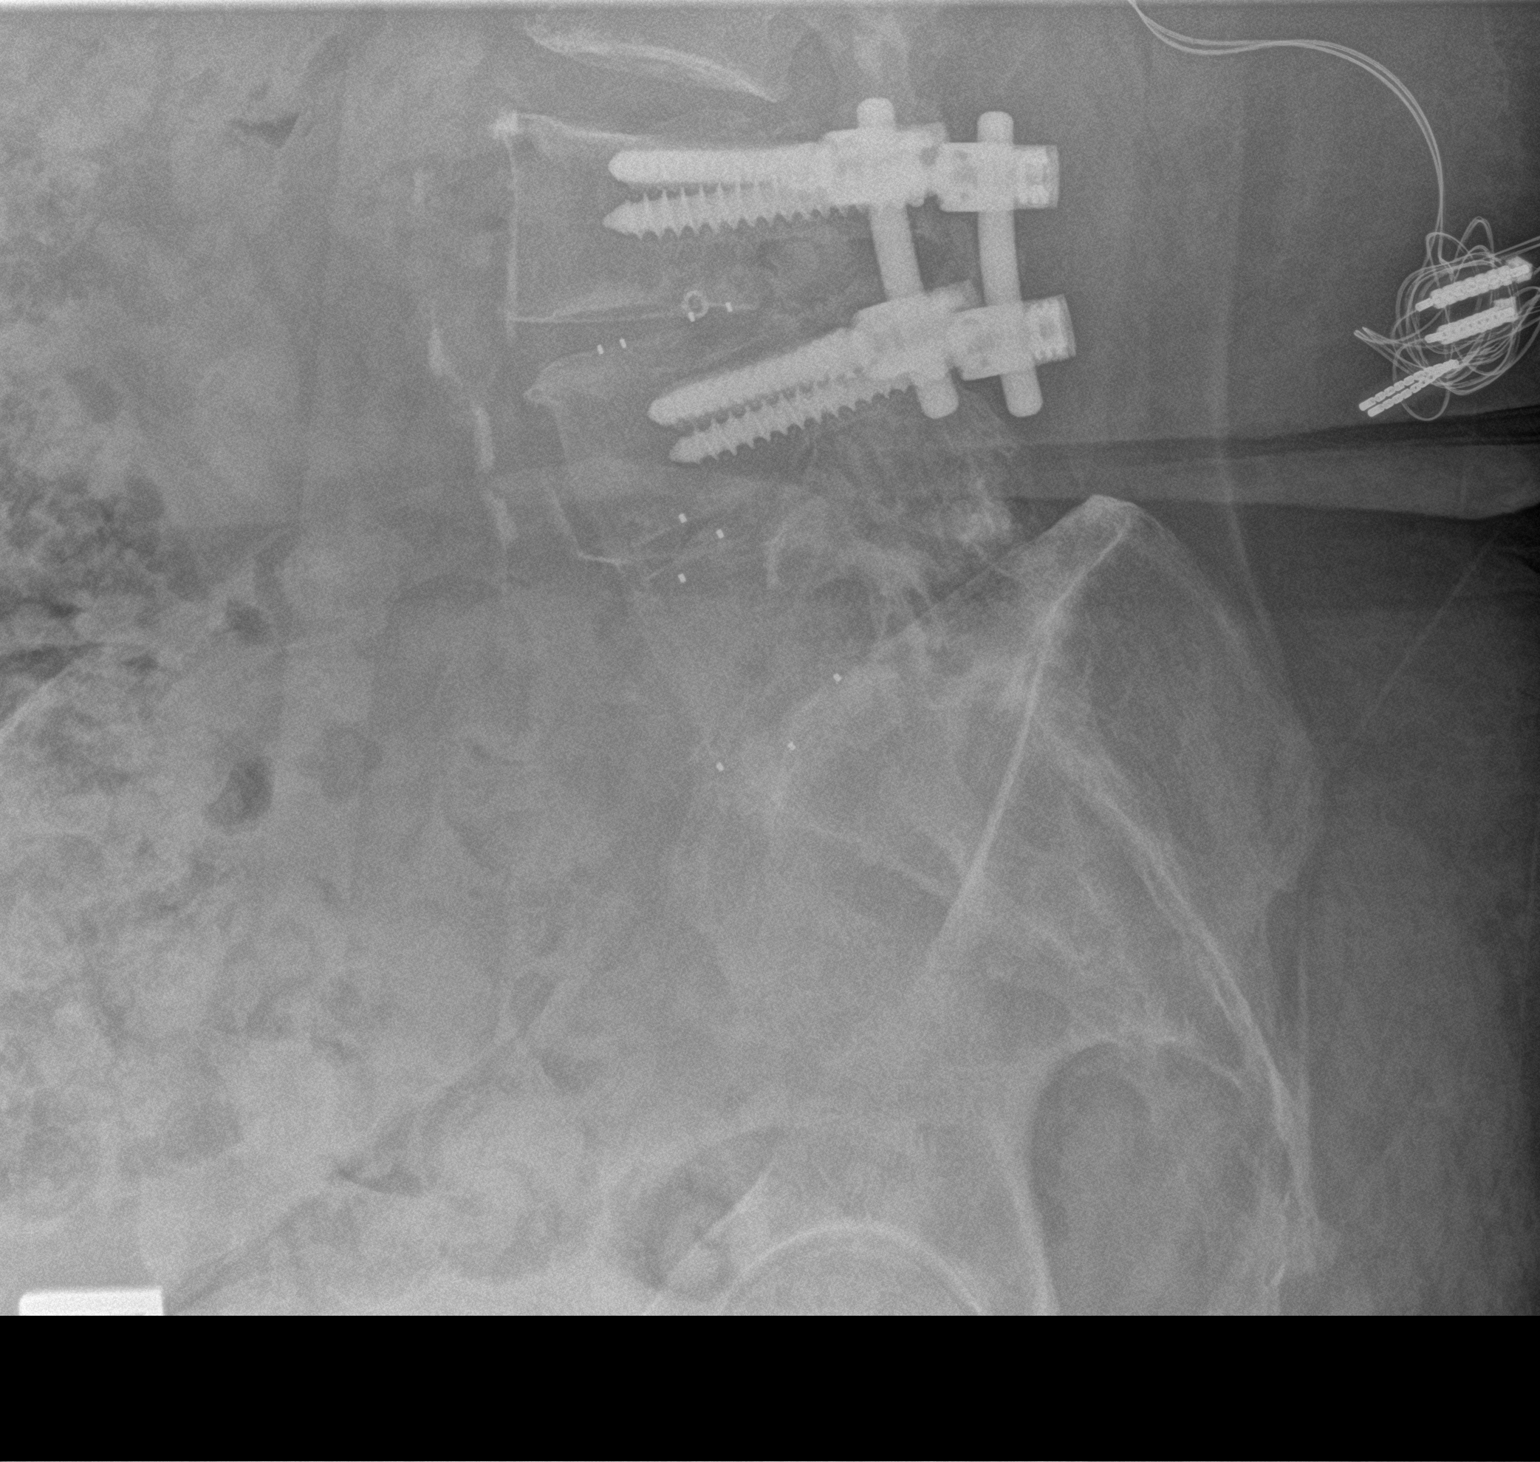

[3 of 3 positions shown; findings below may reference images not displayed]

FINDINGS: Fixation hardware within the lower lumbar spine appears intact and
stable in alignment. Overall osseous alignment is stable, with mild
dextroscoliosis. No fracture line or displaced fracture fragment. No
evidence of acute compression fracture. Multilevel degenerative disc
disease and degenerative facet arthropathy appears stable, mild to
moderate in degree.
IMPRESSION: 1. No acute findings. Stable alignment of the lumbar spine. Fixation
hardware within the lumbar spine appears intact and stable in
alignment.
2. Multilevel degenerative disc disease and degenerative facet
arthropathy, mild to moderate in degree, stable.

## 2018-06-26 IMAGING — CR DG KNEE 1-2V*R*
1 series · 2 of 2 positions shown · non-contrast
Comparison: Plain films of the RIGHT knee dated 09/13/2017 and
12/09/2016.

CLINICAL DATA: Patient reports multiple falls over past week.
Patient states she "hurts all over my body". Reports diffuse pain to
bilateral shoulders, bilateral hips, upper back, lower back and
right knee.

EXAM:
RIGHT KNEE - 1-2 VIEW

[Series 1: dg knee 1-2 views right · 0.14mm/px · 2 of 2 slices shown]
[im 1/2]
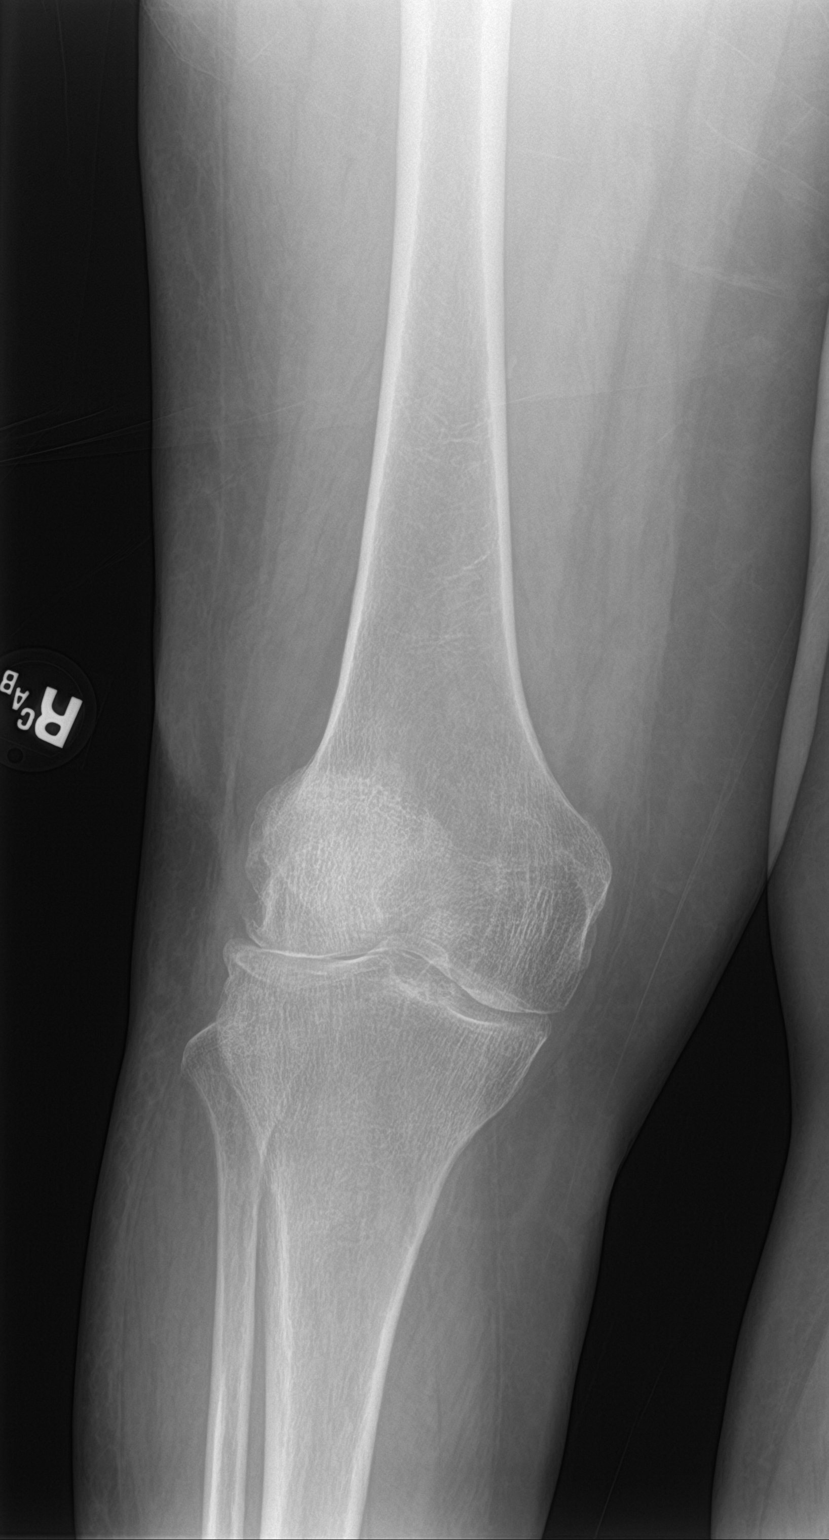
[im 2/2]
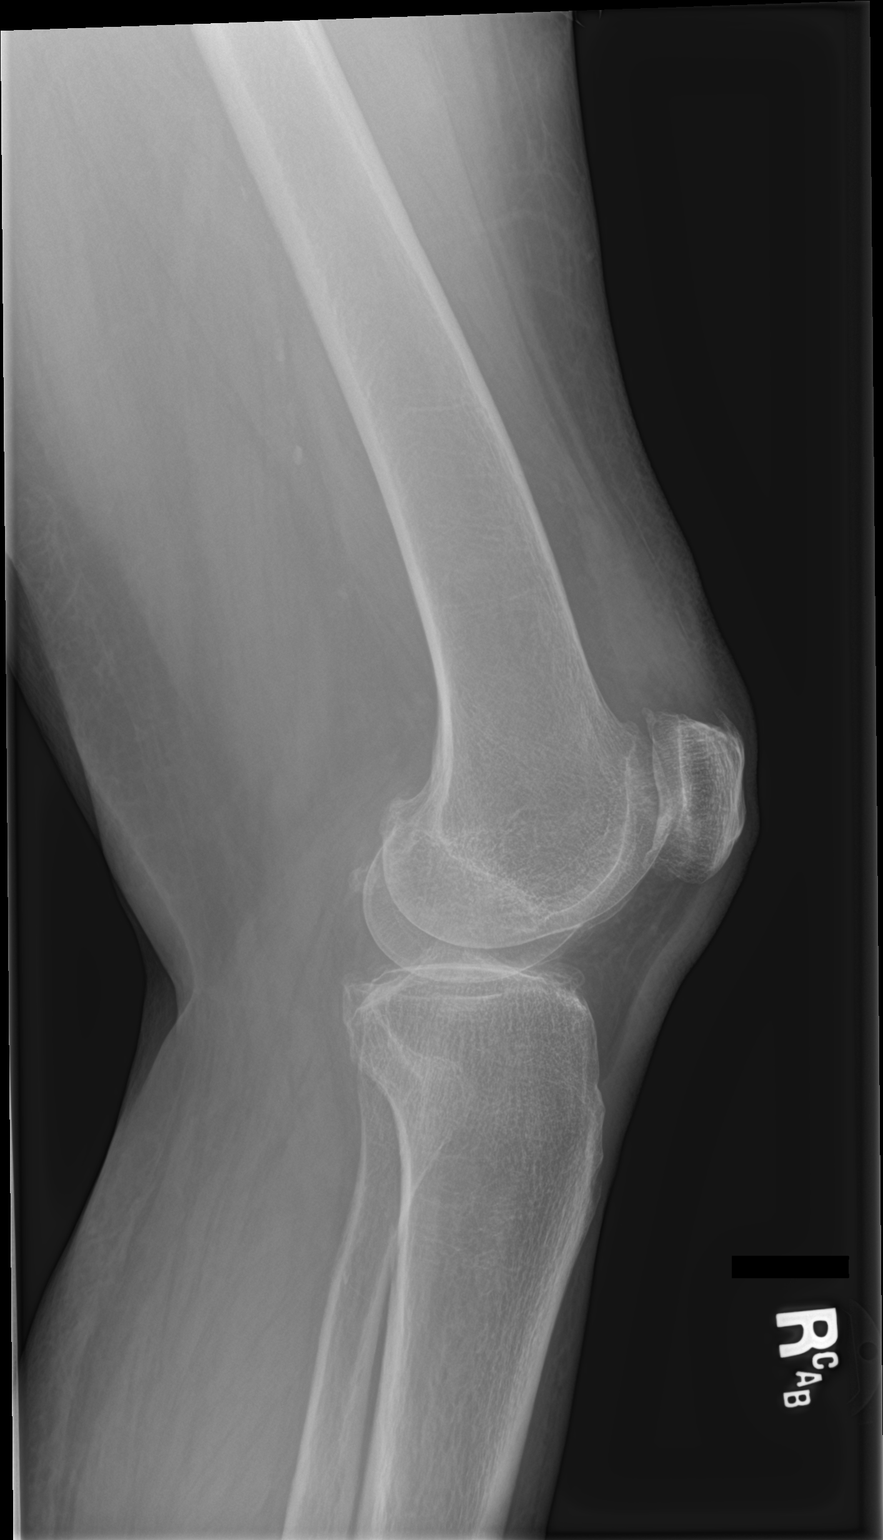

[2 of 2 positions shown; findings below may reference images not displayed]

FINDINGS: Tricompartmental degenerative joint space narrowing, mild to
moderate in degree with associated mild osseous spurring, as also
described on earlier plain film of 09/13/2017. no fracture line or
displaced fracture fragment seen. Joint effusion noted within the
suprapatellar bursa, likely chronic.
IMPRESSION: 1. No acute findings.
2. Stable tricompartmental degenerative osteoarthritis, mild to
moderate in degree.
3. Small joint effusion, likely chronic.

## 2018-06-26 IMAGING — CT CT HIP*R* W/O CM
2 of 3 series · 17 of 46 positions shown, 19 images · non-contrast
Comparison: Plain films the right hip this same day.

CLINICAL DATA: Recurrent falls over the past week. Right hip pain.
Possible right hip fracture by plain films this same day. Initial
encounter.

EXAM:
CT OF THE RIGHT HIP WITHOUT CONTRAST
TECHNIQUE: Multidetector CT imaging of the right hip was performed according to
the standard protocol. Multiplanar CT image reconstructions were
also generated.

[Series 3: axial st · axial · 0.42mm/px · z∈[-1023,-835]mm · 14 of 110 slices shown, 16 images]
[im 8/110  soft-tissue]
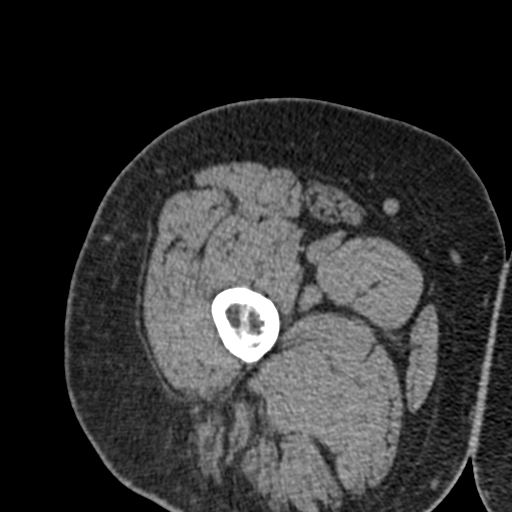
[im 8/110  bone]
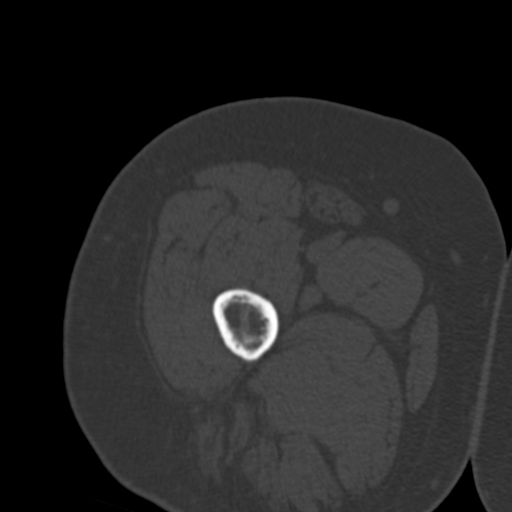
[im 15/110  soft-tissue]
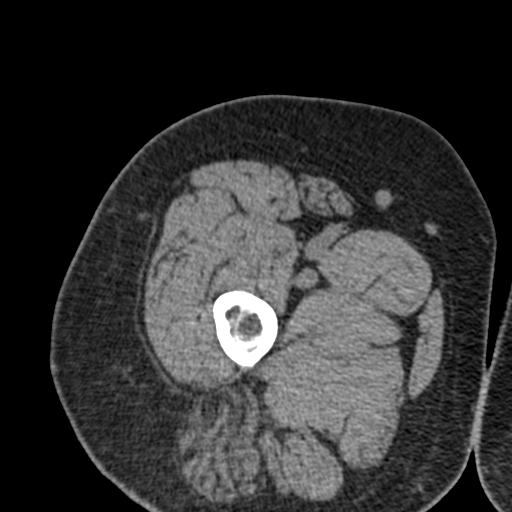
[im 22/110  soft-tissue]
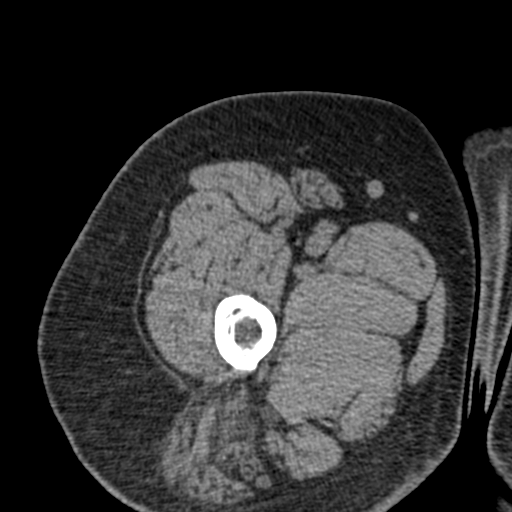
[im 29/110  soft-tissue]
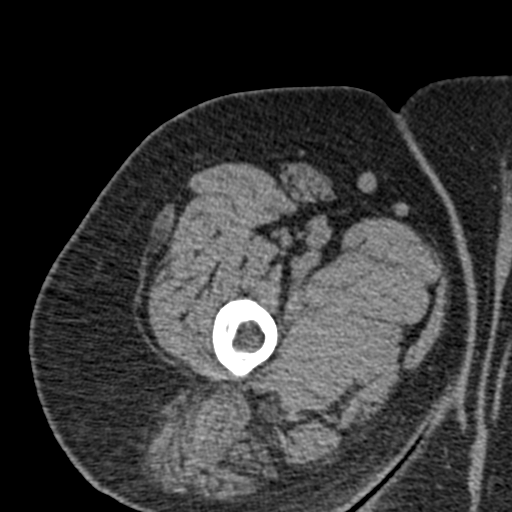
[im 36/110  soft-tissue]
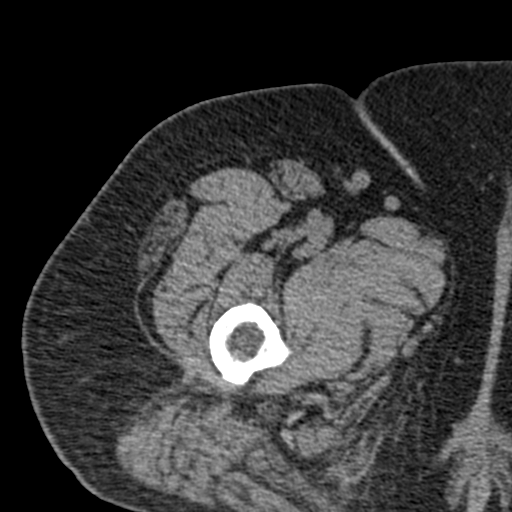
[im 43/110  soft-tissue]
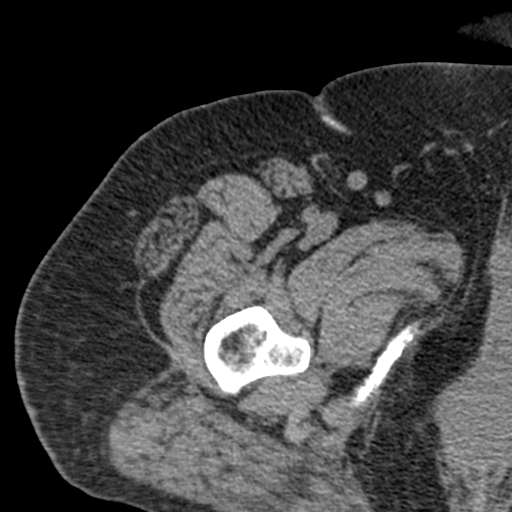
[im 50/110  soft-tissue]
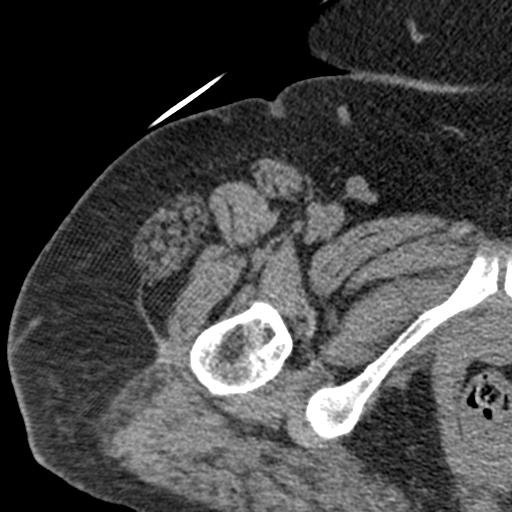
[im 60/110  soft-tissue]
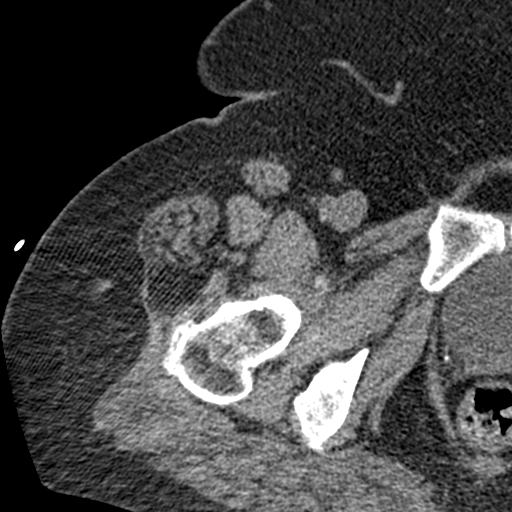
[im 67/110  soft-tissue]
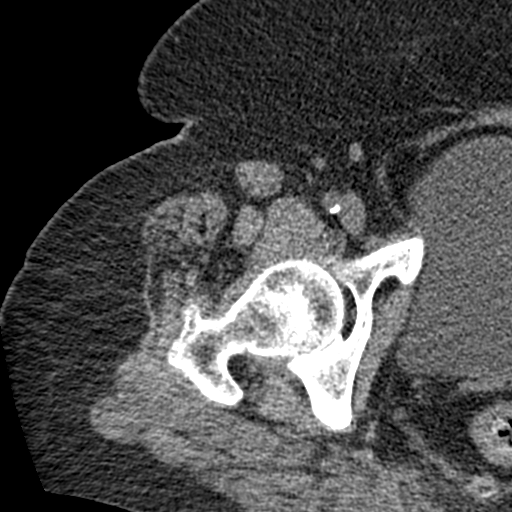
[im 67/110  bone]
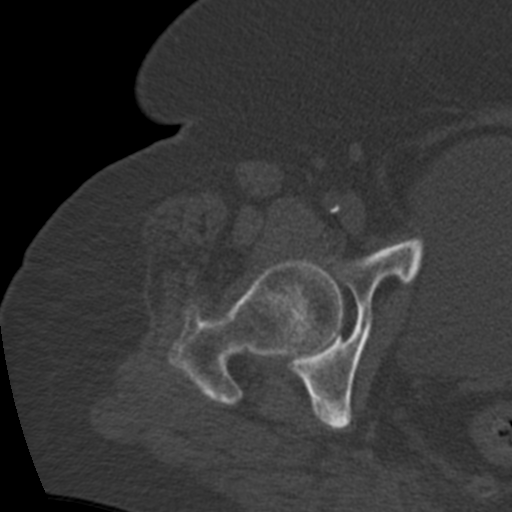
[im 74/110  soft-tissue]
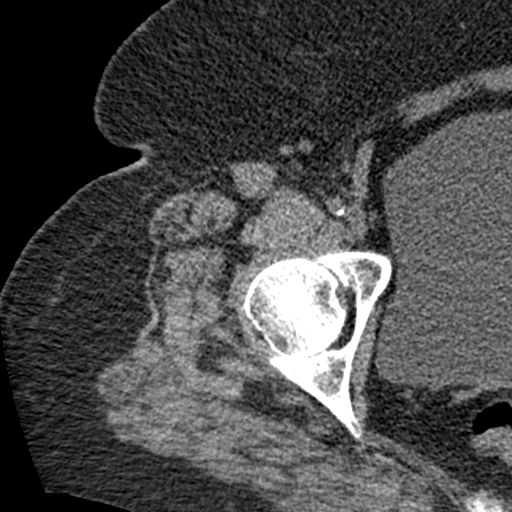
[im 81/110  soft-tissue]
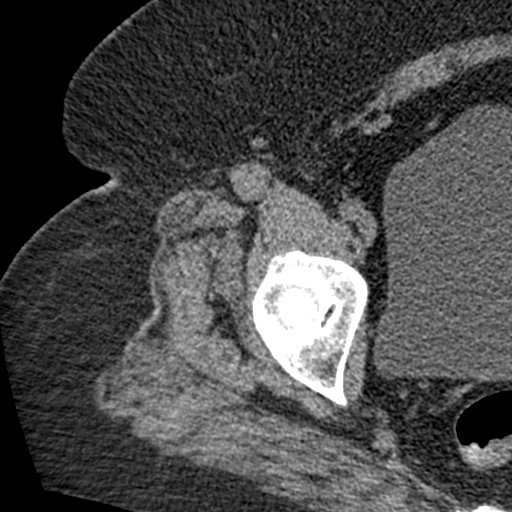
[im 88/110  soft-tissue]
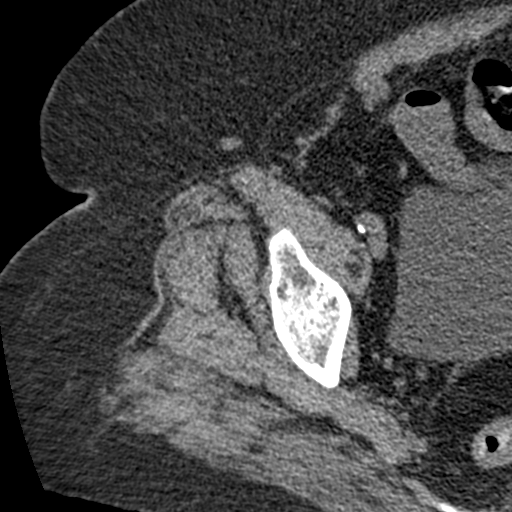
[im 95/110  soft-tissue]
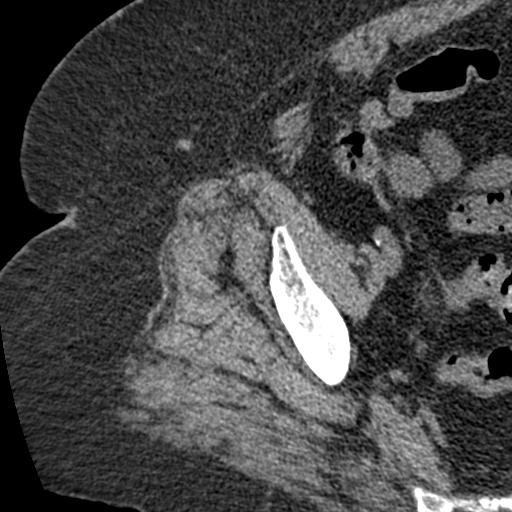
[im 102/110  soft-tissue]
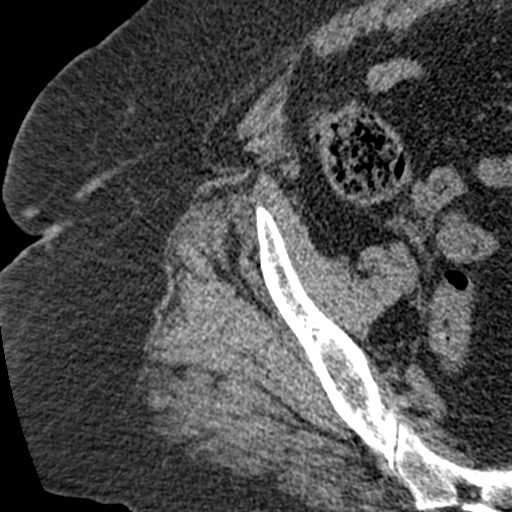

[Series 6: coronal st · coronal · 0.41mm/px · 3 of 86 slices shown]
[im 29/86  soft-tissue]
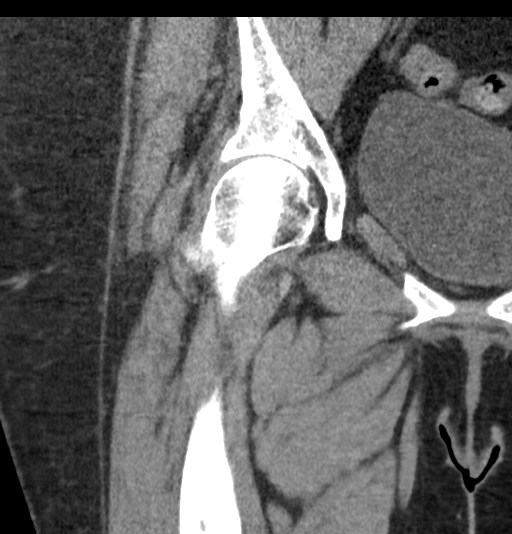
[im 38/86  soft-tissue]
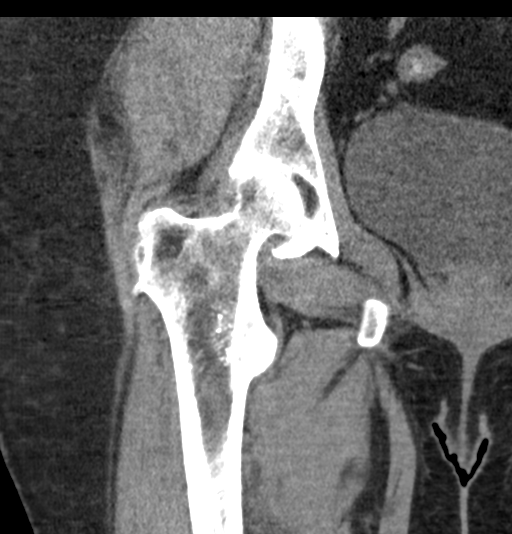
[im 48/86  soft-tissue]
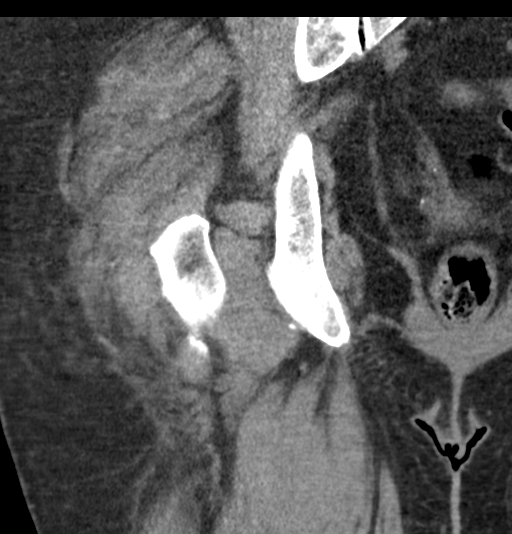

[17 of 46 positions shown; findings below may reference images not displayed]

FINDINGS: Bones/Joint/Cartilage

There is no acute bony or joint abnormality. No lytic or sclerotic
lesion is identified. Minimal degenerative disease is seen about the
right hip. No joint effusion.

Ligaments

Suboptimally assessed by CT.

Muscles and Tendons

Intact and normal in appearance.

Soft tissues

Imaged intrapelvic contents demonstrate no acute abnormality.
Scattered atherosclerotic calcifications noted.
IMPRESSION: Negative for fracture.  No acute abnormality.

## 2018-06-26 IMAGING — CR DG CHEST 1V PORT
1 series · 1 of 1 positions shown · non-contrast
Comparison: Chest x-ray dated 07/19/2017.

CLINICAL DATA: Patient reports multiple falls over past week.
Patient states she "hurts all over my body". Reports diffuse pain to
bilateral shoulders, bilateral hips, upper back, lower back and
right knee.

EXAM:
PORTABLE CHEST 1 VIEW

[dg chest port 1 view]
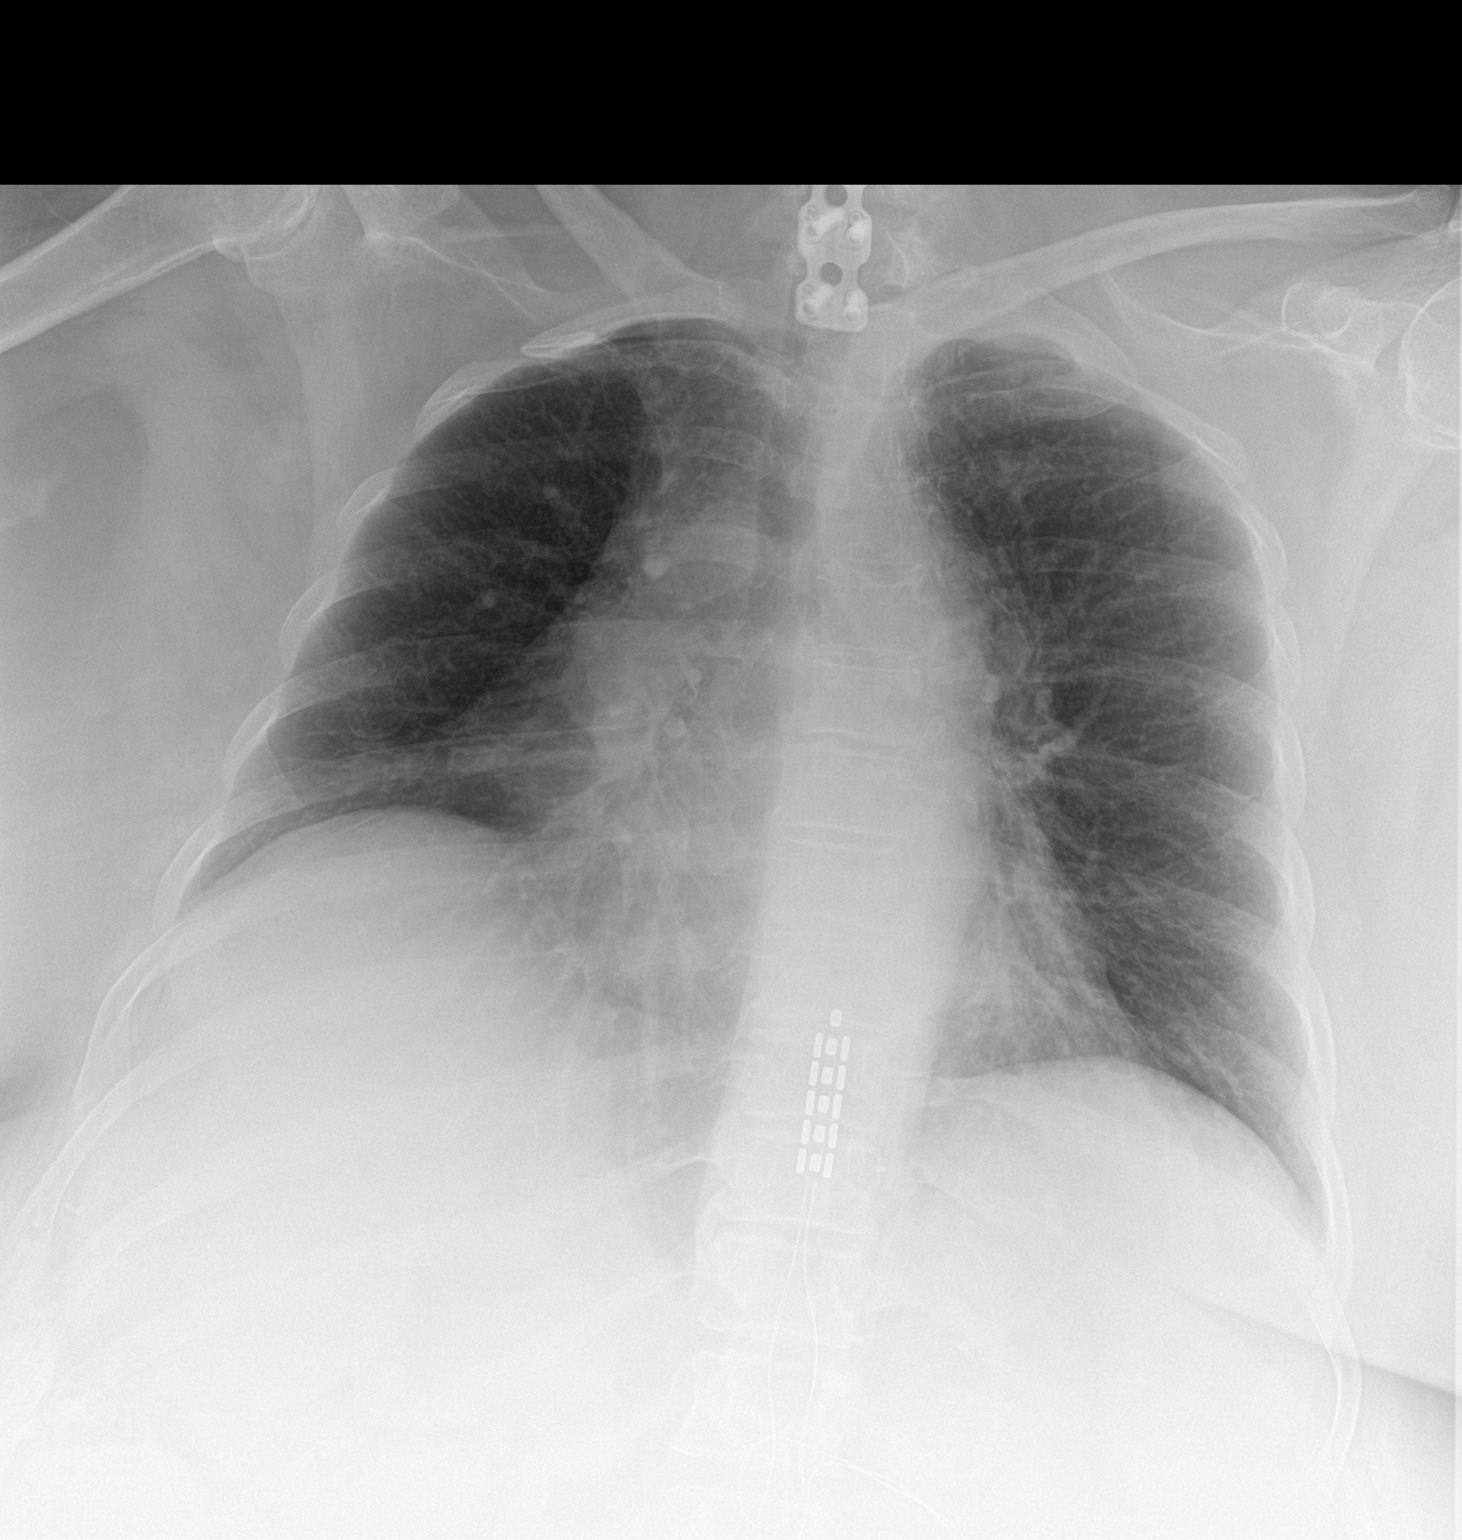

[1 of 1 positions shown; findings below may reference images not displayed]

FINDINGS: Stable cardiomegaly. Overall cardiomediastinal silhouette is stable
in size and configuration. Lungs are clear. No acute appearing
osseous abnormality. Stimulator wires overlie the lower thoracic
spine.
IMPRESSION: No active disease. No evidence of pneumonia or pulmonary edema. No
osseous fracture or dislocation seen.

## 2018-06-26 IMAGING — CR DG SHOULDER 2+V*R*
1 series · 3 of 3 positions shown · non-contrast
Comparison: Plain film of the RIGHT shoulder dated 09/01/2015.

CLINICAL DATA: Patient reports multiple falls over past week.
Patient states she "hurts all over my body". Reports diffuse pain to
bilateral shoulders, bilateral hips, upper back, lower back and
right knee.

EXAM:
RIGHT SHOULDER - 2+ VIEW

[Series 1: dg shoulder right · 0.14mm/px · 3 of 3 slices shown]
[im 1/3]
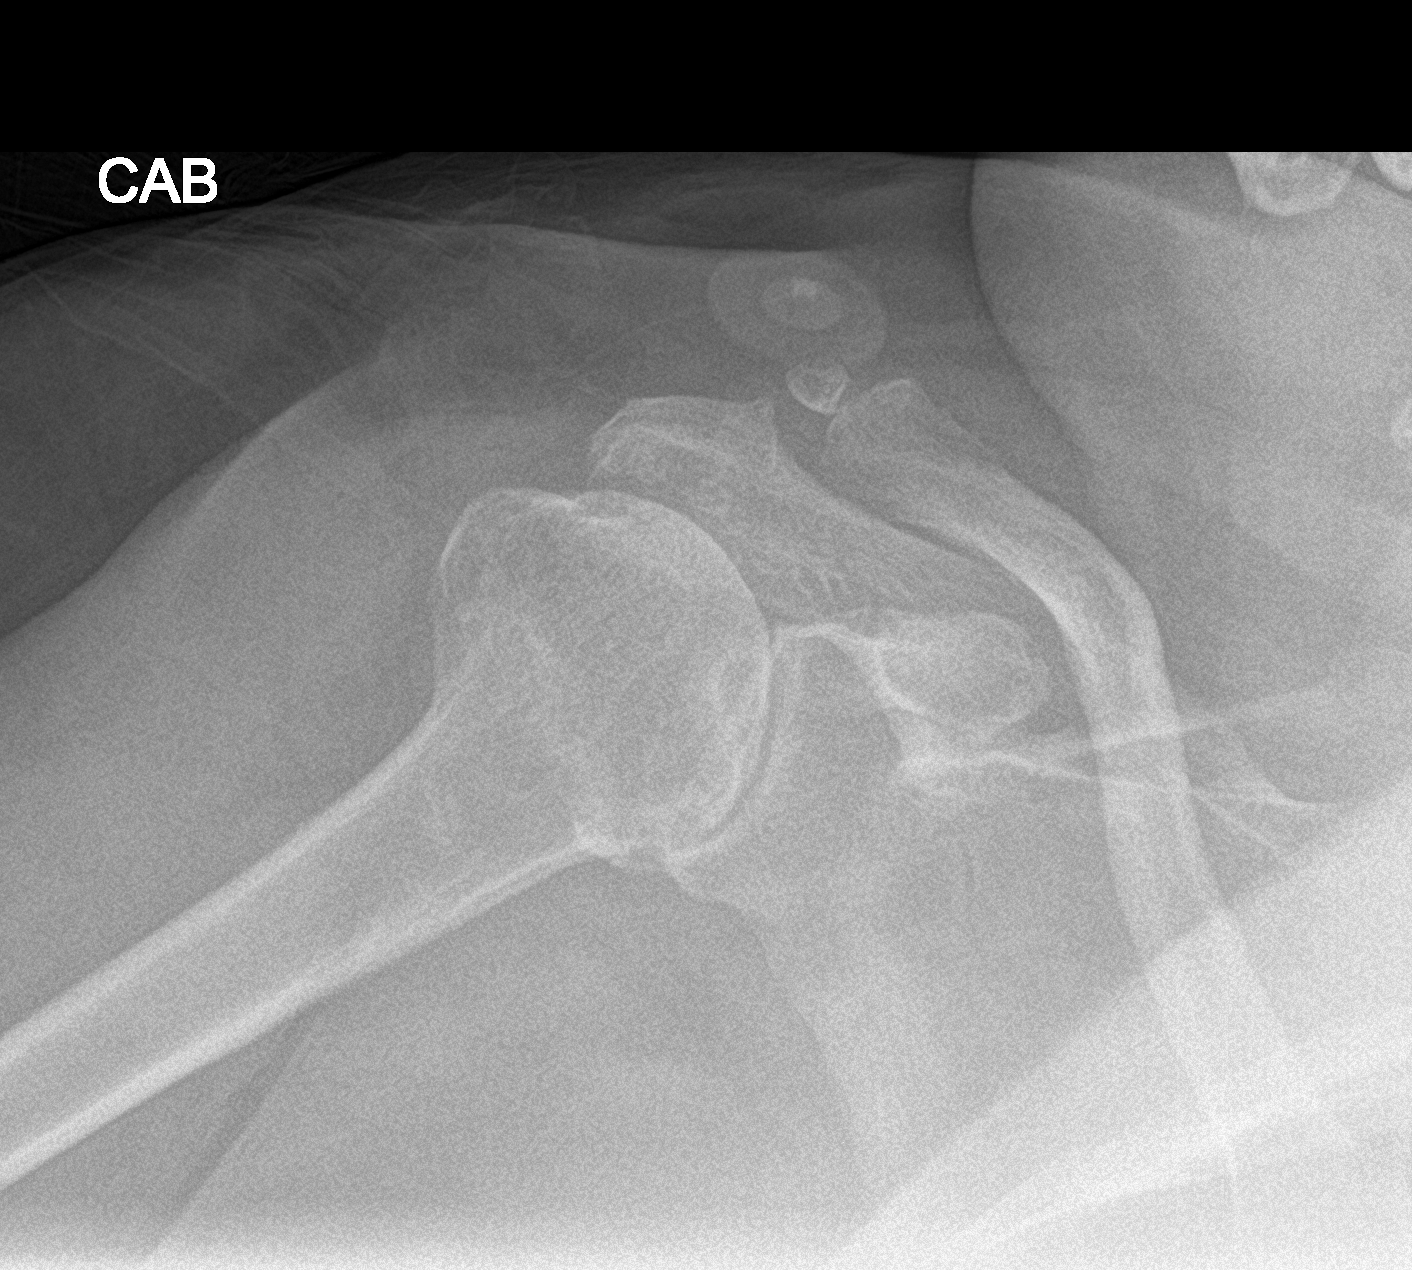
[im 2/3]
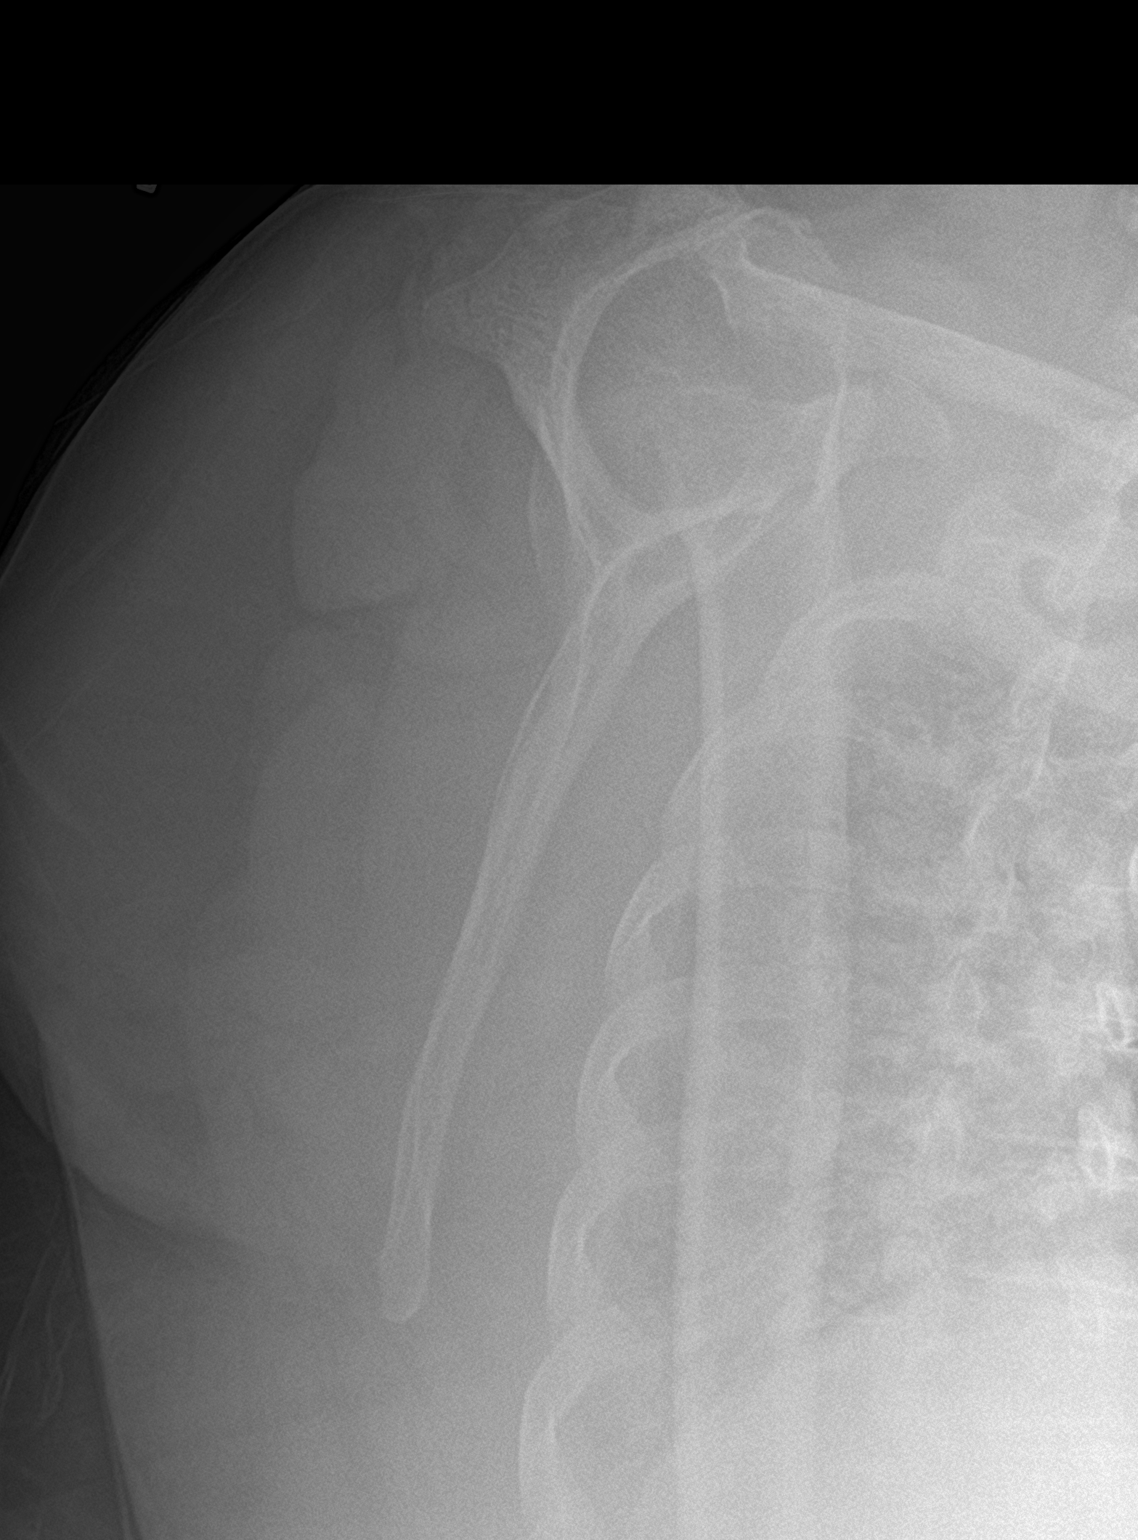
[im 3/3]
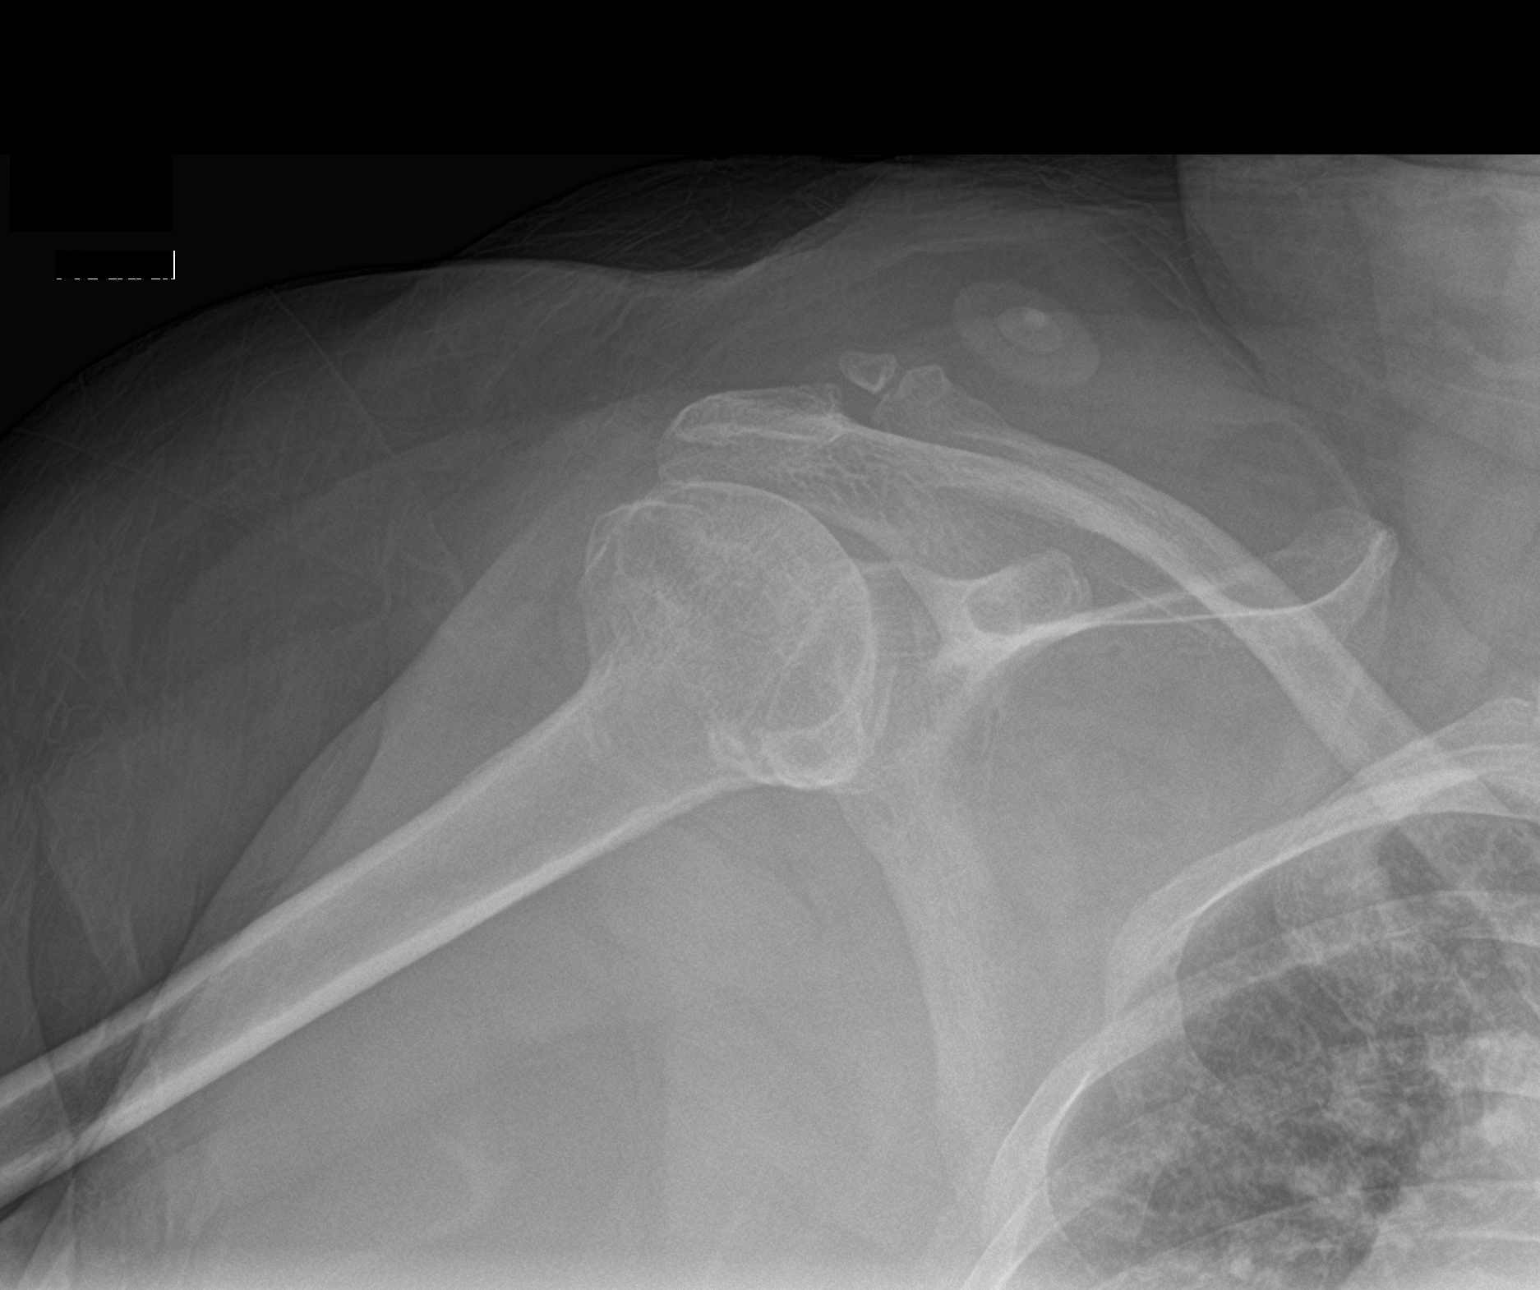

[3 of 3 positions shown; findings below may reference images not displayed]

FINDINGS: Osseous alignment is stable. Old posttraumatic deformity of the
RIGHT humeral head/neck. At least mild degenerative narrowing at the
glenohumeral joint space. Chronic spurring at the acromioclavicular
joint space. No acute appearing osseous abnormality.
IMPRESSION: 1. No acute findings.  No evidence of acute fracture or dislocation.
2. Chronic posttraumatic deformity of the RIGHT humeral head/neck,
stable.
3. Mild degenerative change at the glenohumeral and
acromioclavicular joint spaces.

## 2018-06-26 IMAGING — CR DG HIP (WITH OR WITHOUT PELVIS) 2-3V*R*
1 series · 3 of 3 positions shown · non-contrast
Comparison: 07/14/2016, 05/31/2016.

CLINICAL DATA: 67-year-old with multiple falls over the past week
with pain in both shoulders, both hips, the UPPER back, the low back
and the RIGHT knee. Initial encounter. Surgical history includes
LEFT rotator cuff repair, cervical spine fusion, lumbar spine fusion
and RIGHT knee arthroscopy.

EXAM:
DG HIP (WITH OR WITHOUT PELVIS) 2-3V RIGHT

[Series 1: dg hip unilat w or w/o pelvis 2-3 views  · non-contrast · 0.14mm/px · 3 of 3 slices shown]
[im 1/3]
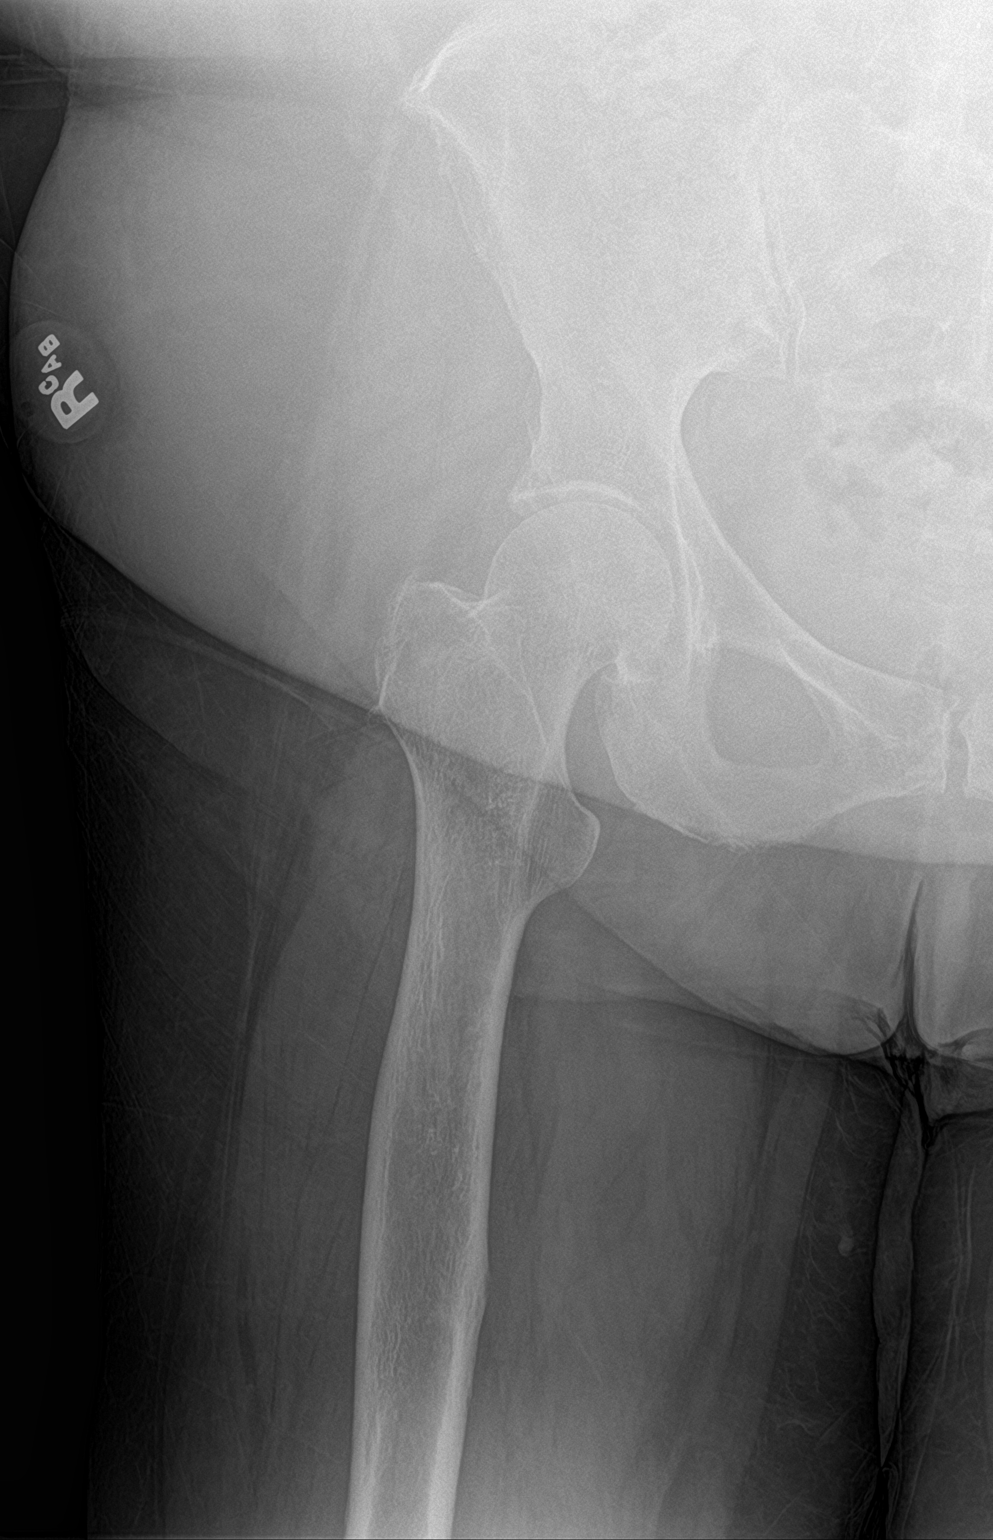
[im 2/3]
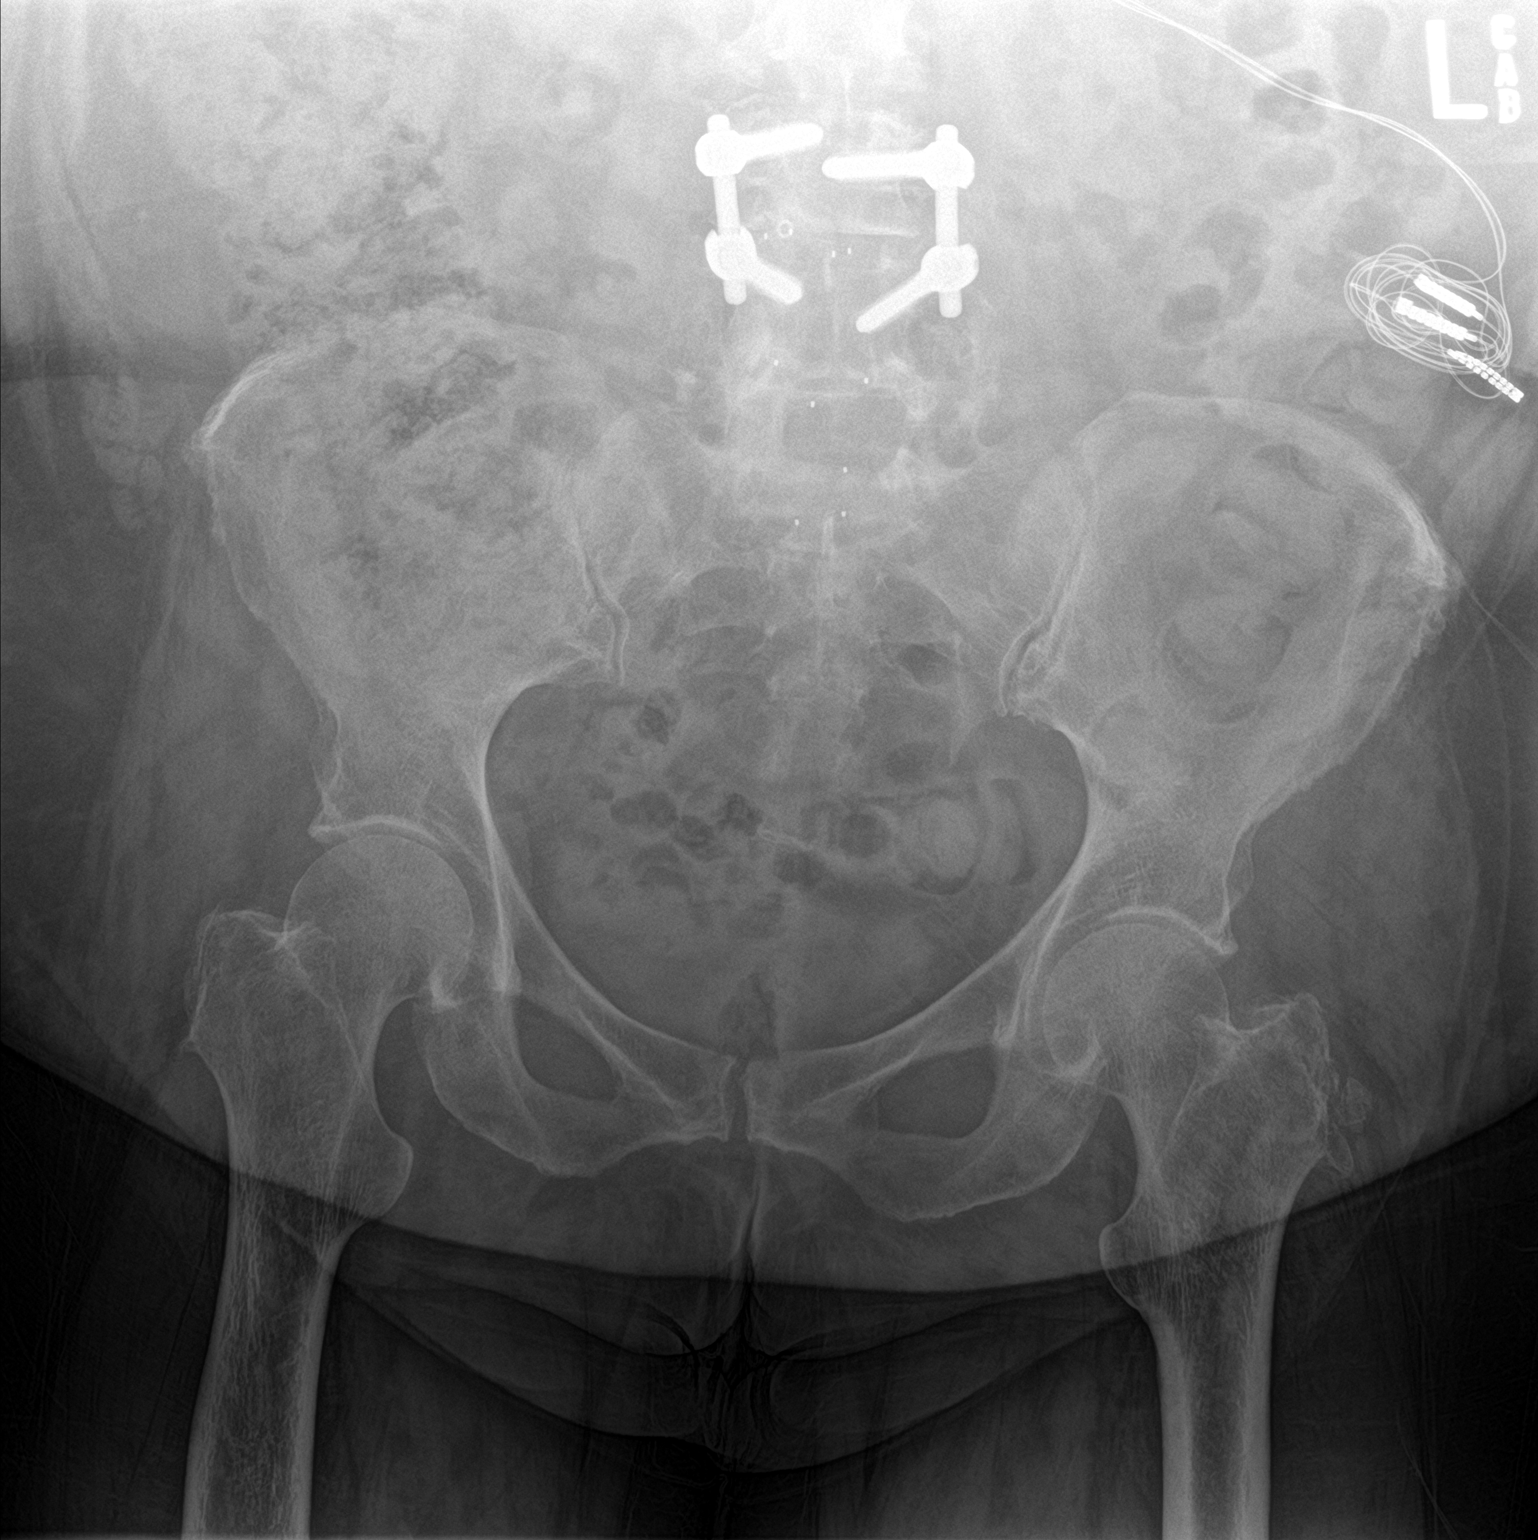
[im 3/3]
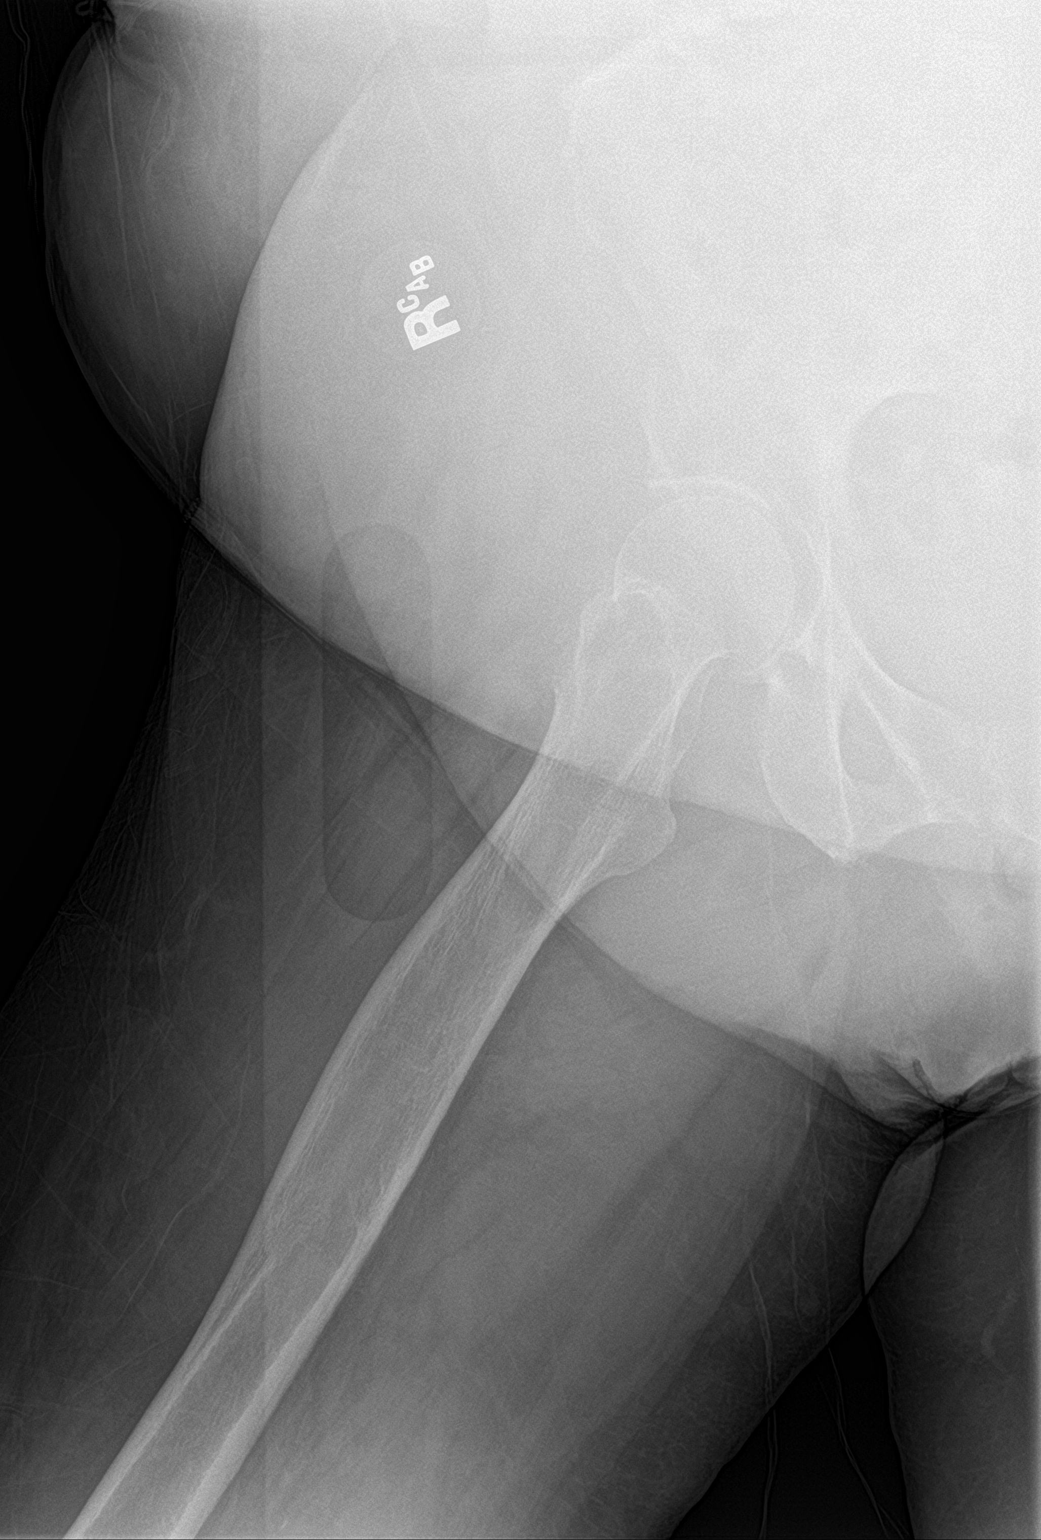

[3 of 3 positions shown; findings below may reference images not displayed]

FINDINGS: Images were obtained with the hip internally rotated. Possible
mildly impacted fracture involving the femoral neck. No fractures
elsewhere. Mild axial joint space narrowing, unchanged.

Included AP pelvis demonstrates intact sacroiliac joints and
symphysis pubis with mild degenerative changes. No fractures
elsewhere in the pelvis.
IMPRESSION: Possible mildly impacted fracture involving the RIGHT femoral neck.

## 2018-06-26 IMAGING — CR DG HIP (WITH OR WITHOUT PELVIS) 2-3V*L*
1 series · 3 of 3 positions shown · non-contrast
Comparison: None.

CLINICAL DATA: Patient reports multiple falls over past week.
Patient states she "hurts all over my body". Reports diffuse pain to
bilateral shoulders, bilateral hips, upper back, lower back and
right knee.

EXAM:
DG HIP (WITH OR WITHOUT PELVIS) 2-3V LEFT

[Series 1: dg hip unilat w or w/o pelvis 2-3 views  · non-contrast · 0.14mm/px · 3 of 3 slices shown]
[im 1/3]
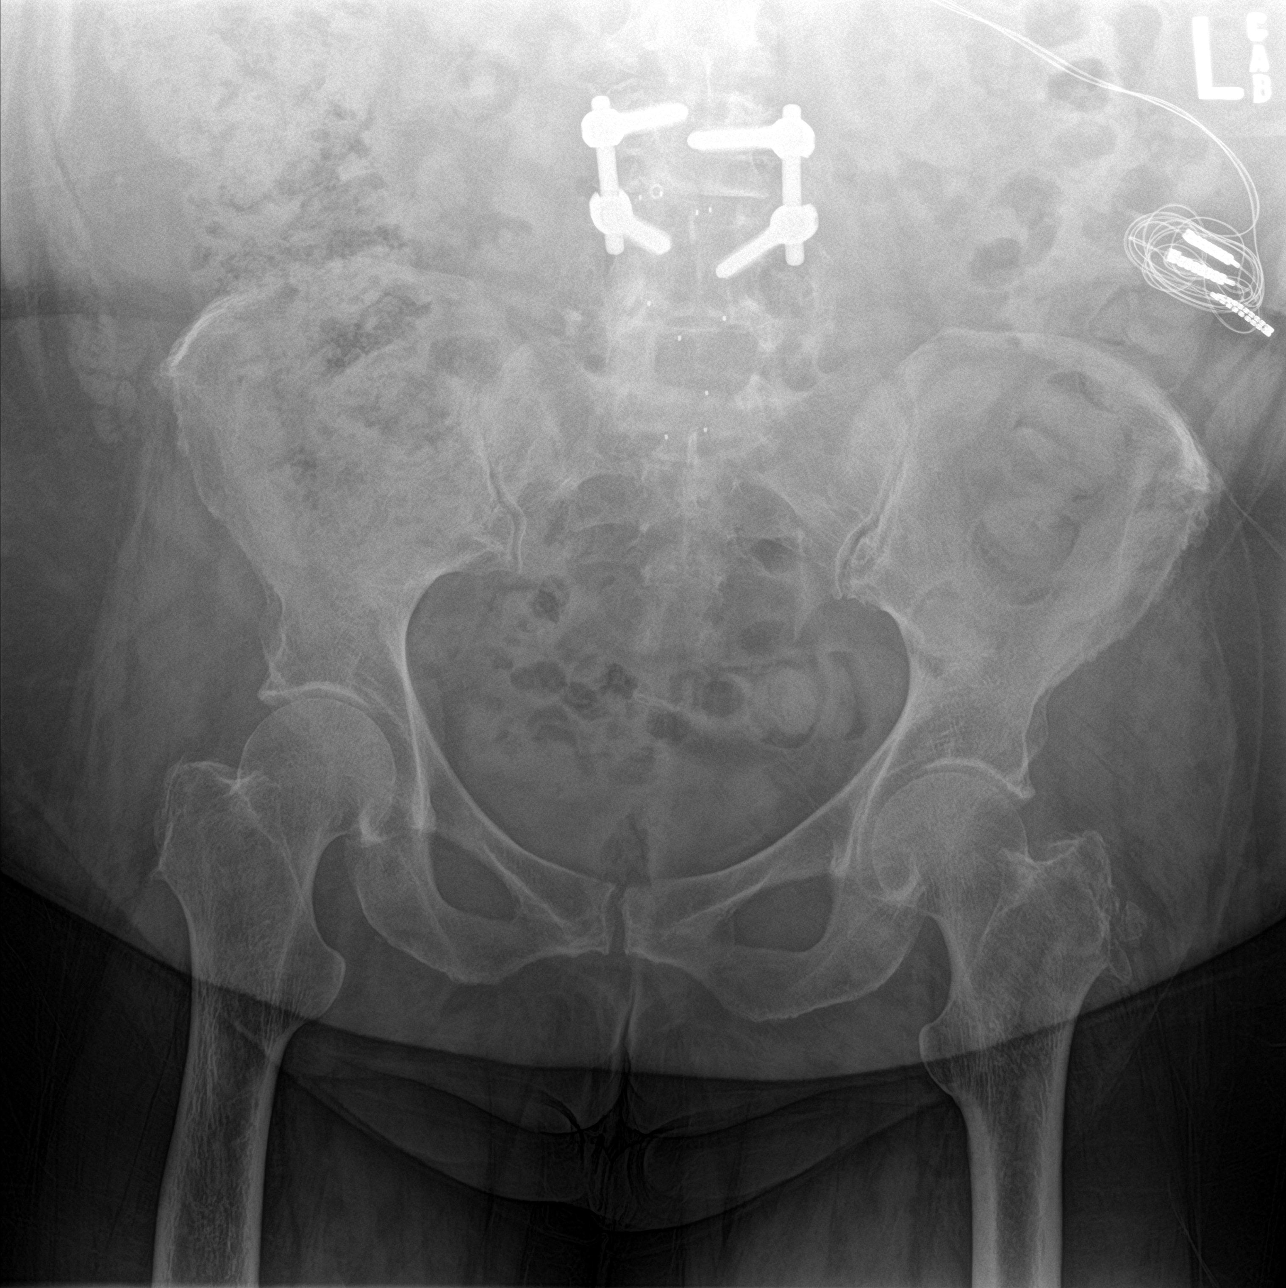
[im 2/3]
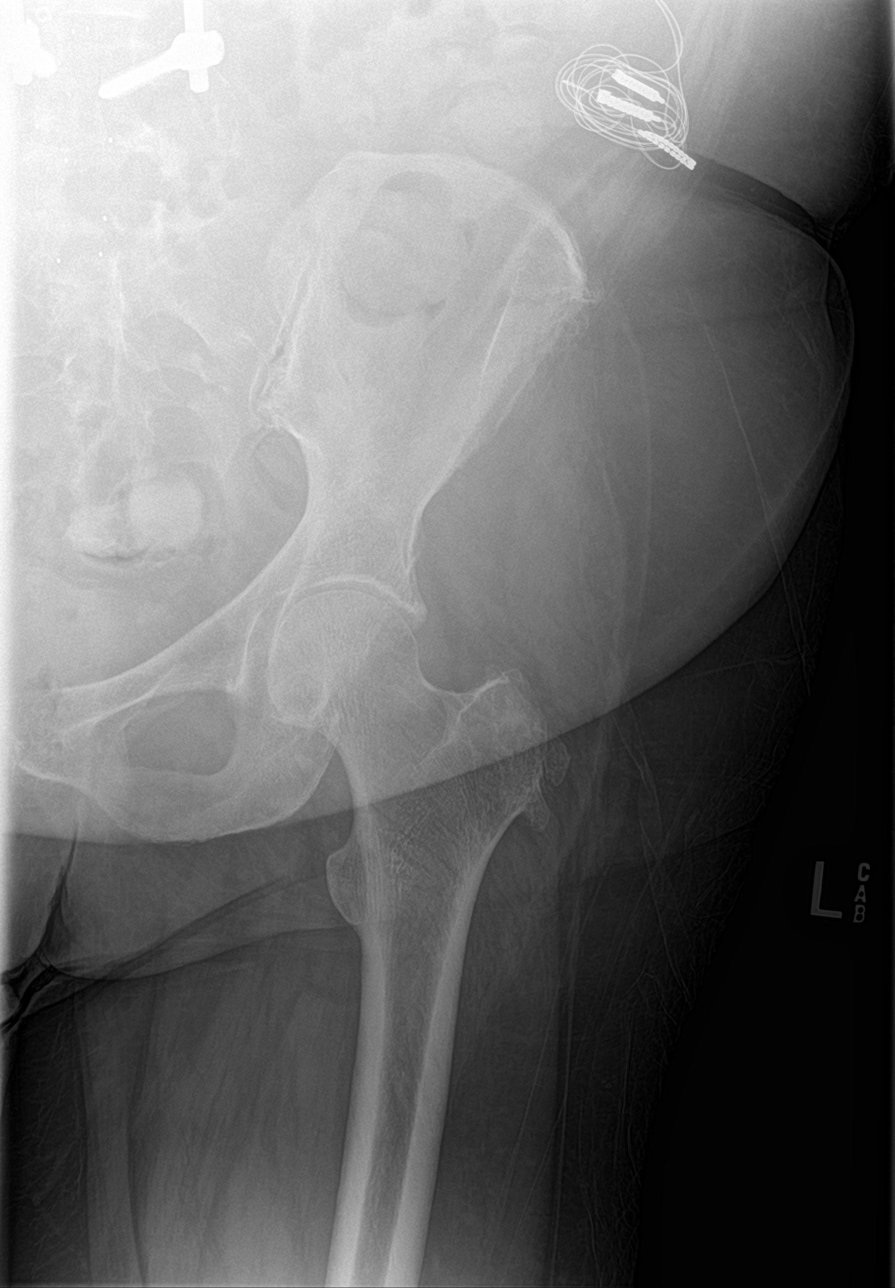
[im 3/3]
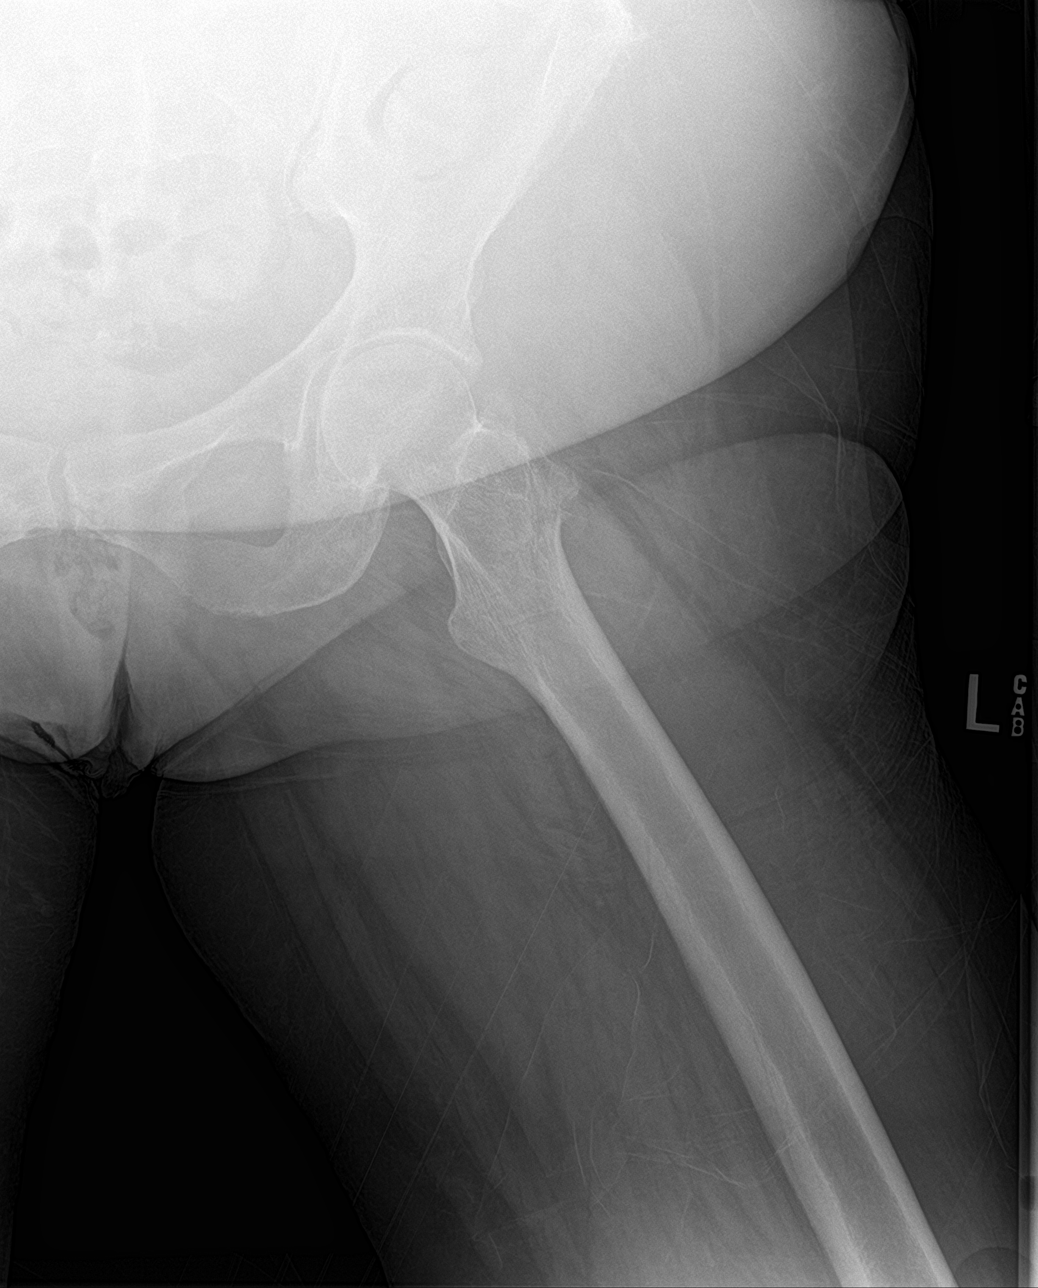

[3 of 3 positions shown; findings below may reference images not displayed]

FINDINGS: Single view of the pelvis and two views of the LEFT hip are
provided. Osseous alignment appears normal. No fracture line or
displaced fracture fragment identified. No significant degenerative
change at either hip joint. Fusion hardware noted in the lower
lumbar spine. Soft tissues about the pelvis and LEFT hip are
unremarkable.
IMPRESSION: Negative.

## 2018-06-26 IMAGING — CR DG SHOULDER 2+V*L*
1 series · 3 of 3 positions shown · non-contrast
Comparison: None.

CLINICAL DATA: Patient reports multiple falls over past week.
Patient states she "hurts all over my body". Reports diffuse pain to
bilateral shoulders, bilateral hips, upper back, lower back and
right knee.

EXAM:
LEFT SHOULDER - 2+ VIEW

[Series 1: dg shoulder left · 0.14mm/px · 3 of 3 slices shown]
[im 1/3]
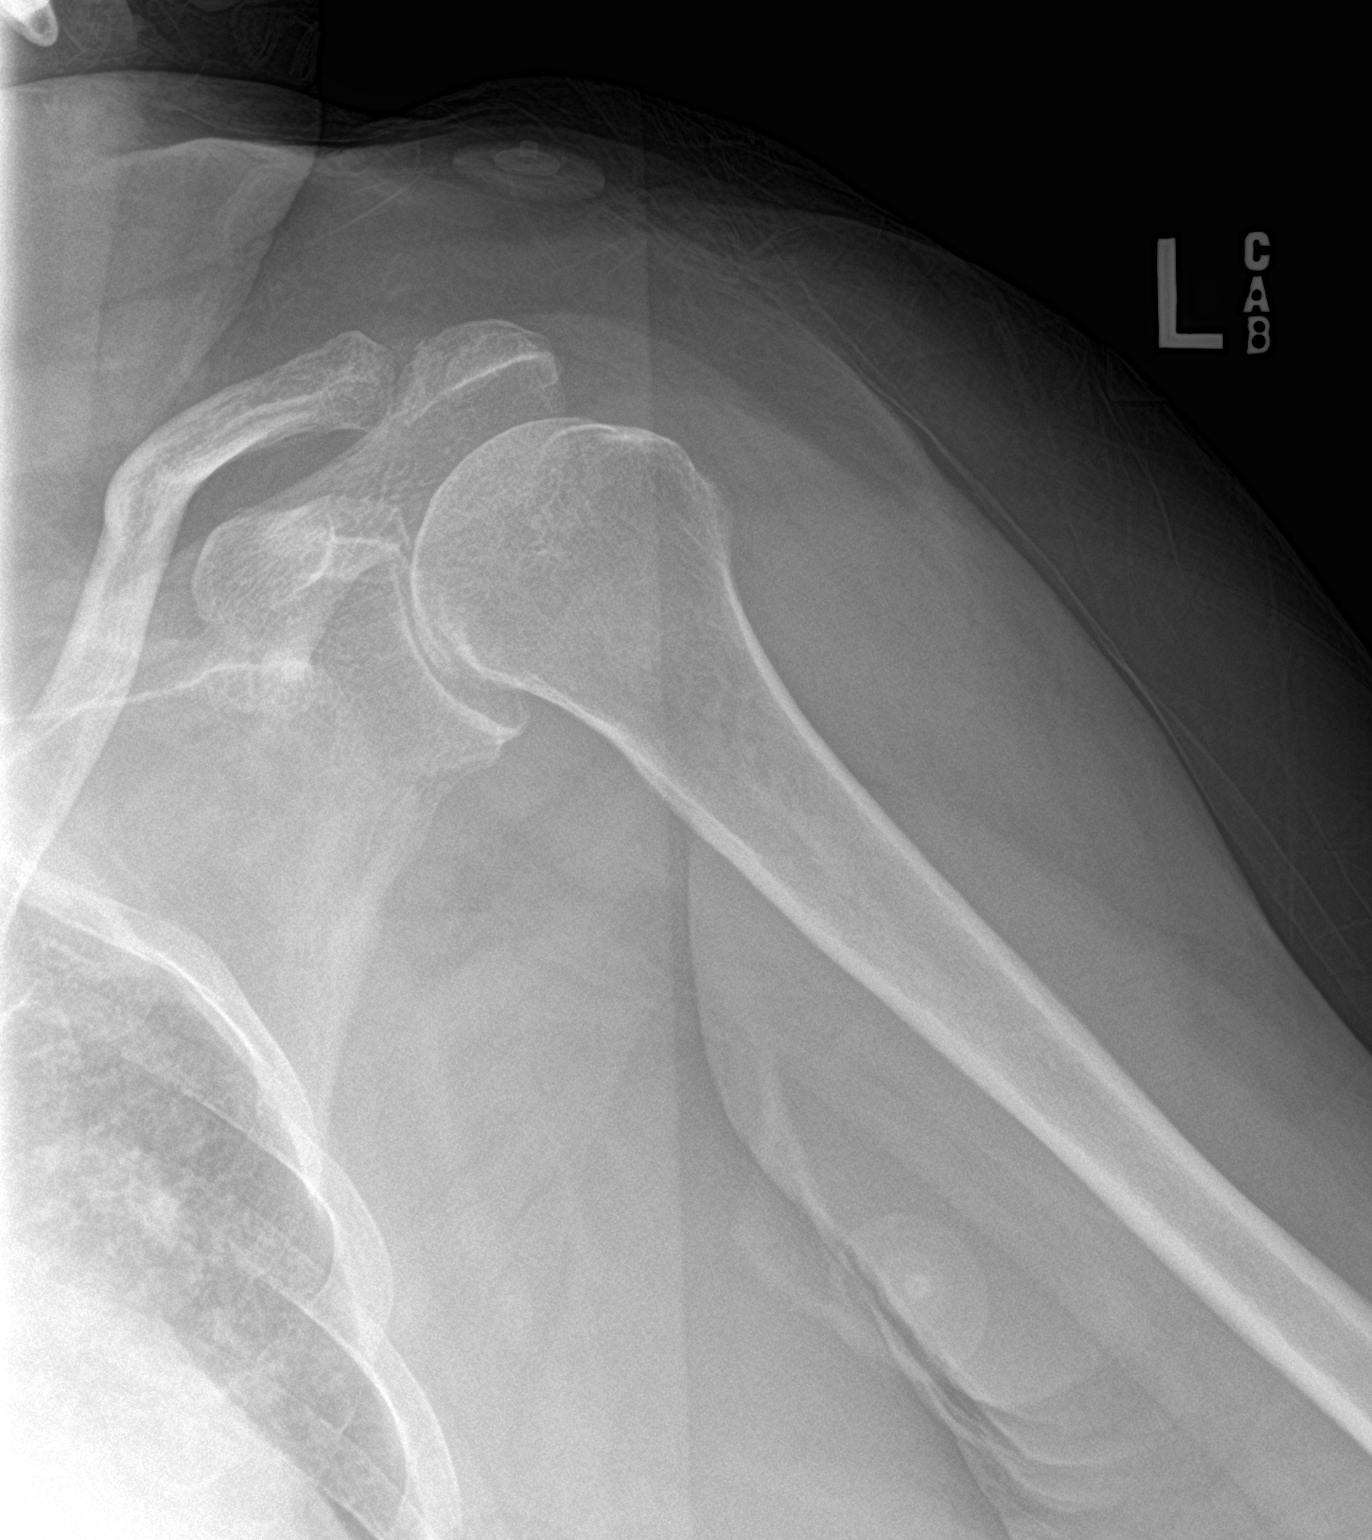
[im 2/3]
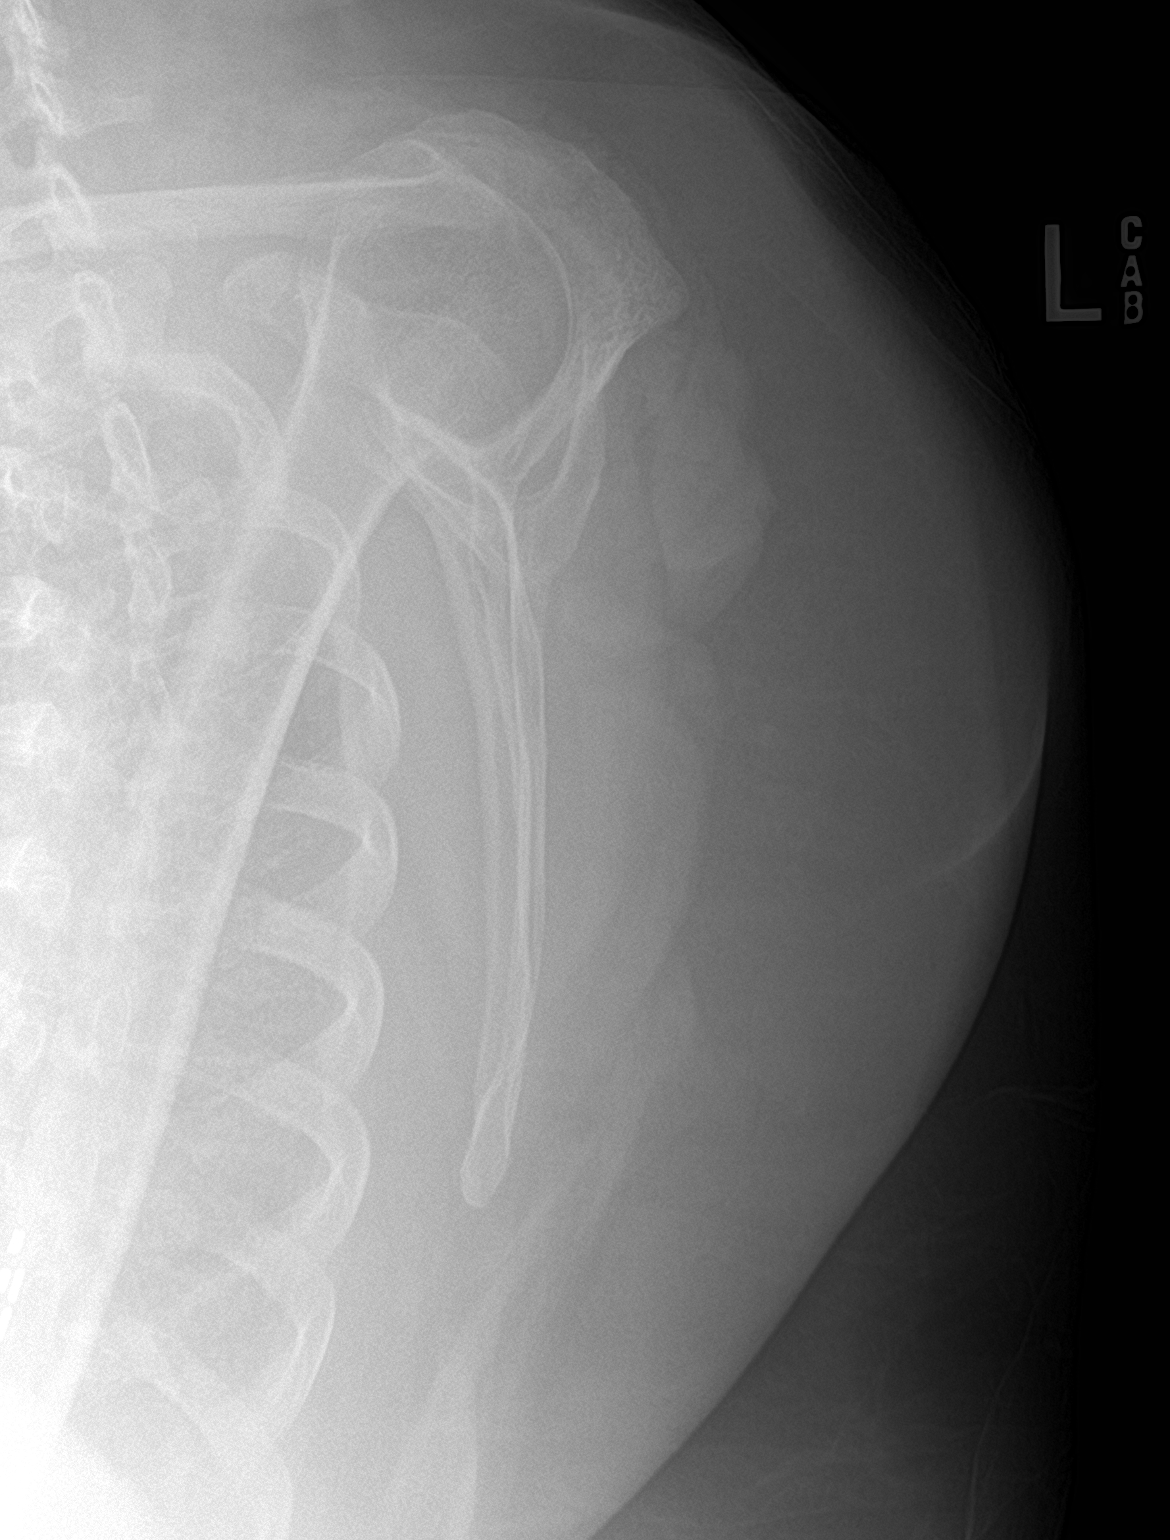
[im 3/3]
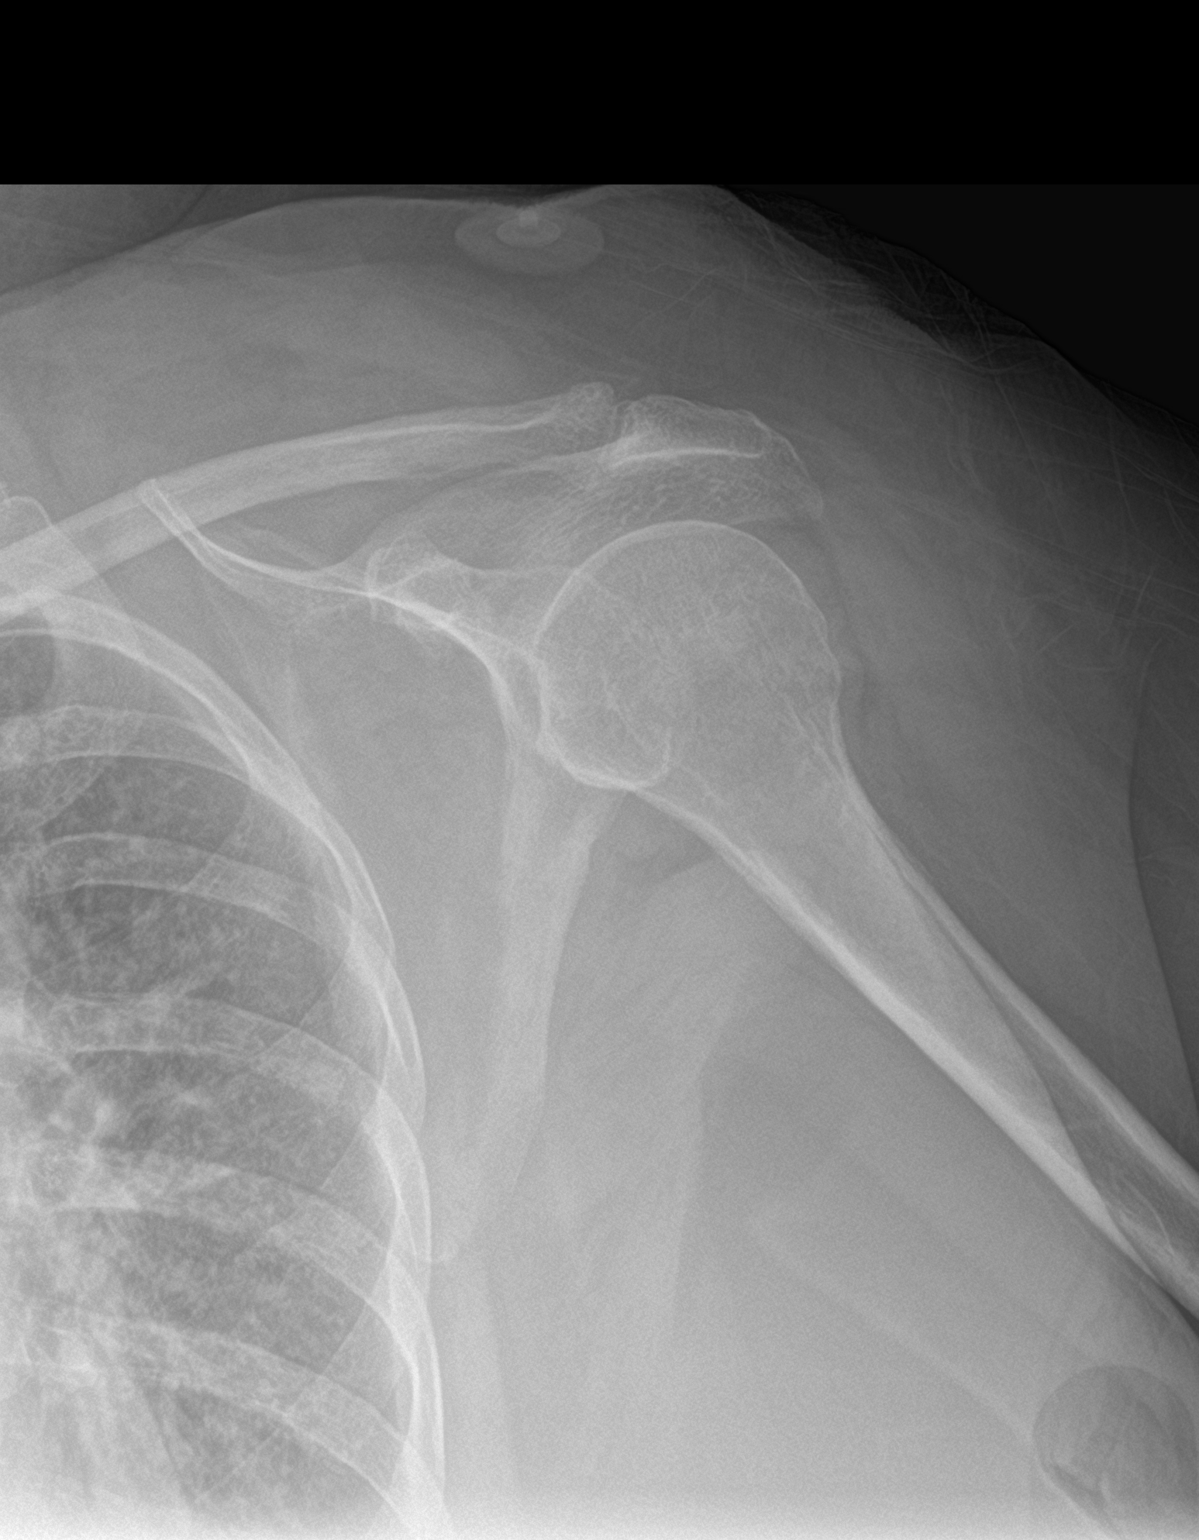

[3 of 3 positions shown; findings below may reference images not displayed]

FINDINGS: There is no evidence of fracture or dislocation. There is no
evidence of arthropathy or other focal bone abnormality. Soft
tissues are unremarkable.
IMPRESSION: Negative.

## 2018-07-03 ENCOUNTER — Encounter (INDEPENDENT_AMBULATORY_CARE_PROVIDER_SITE_OTHER): Payer: Self-pay | Admitting: Vascular Surgery

## 2018-07-03 ENCOUNTER — Ambulatory Visit (INDEPENDENT_AMBULATORY_CARE_PROVIDER_SITE_OTHER): Payer: Medicare Other | Admitting: Vascular Surgery

## 2018-07-03 VITALS — BP 95/57 | HR 88 | Resp 16 | Ht 63.0 in | Wt 215.0 lb

## 2018-07-03 DIAGNOSIS — M79605 Pain in left leg: Secondary | ICD-10-CM

## 2018-07-03 DIAGNOSIS — I890 Lymphedema, not elsewhere classified: Secondary | ICD-10-CM | POA: Diagnosis not present

## 2018-07-03 DIAGNOSIS — M79604 Pain in right leg: Secondary | ICD-10-CM | POA: Diagnosis not present

## 2018-07-03 DIAGNOSIS — E1165 Type 2 diabetes mellitus with hyperglycemia: Secondary | ICD-10-CM

## 2018-07-03 DIAGNOSIS — E118 Type 2 diabetes mellitus with unspecified complications: Secondary | ICD-10-CM

## 2018-07-03 DIAGNOSIS — Z87891 Personal history of nicotine dependence: Secondary | ICD-10-CM

## 2018-07-03 DIAGNOSIS — I10 Essential (primary) hypertension: Secondary | ICD-10-CM

## 2018-07-03 NOTE — Progress Notes (Signed)
MRN : [id number]  Stephanie [name] is a 68 y.o. (01-06-1951) female who presents with chief complaint of  Chief Complaint  Patient presents with  . Follow-up  .  History of Present Illness: Patient returns today in follow up of leg pain and swelling.  Since she was seen last year, she is complaining of more pain and swelling in her legs.  She is now in a facility.  It does not sound like she is nearly as active at her legs are dependent more of the time.  The pain goes down the back of her legs and into her posterior calves and knee area.  No ulceration or infection that I can tell.  No fevers or chills. The right leg is a little worse than the left  Current Outpatient Medications  Medication Sig Dispense Refill  . acetaminophen (TYLENOL) 325 MG tablet Take 650 mg by mouth every 6 (six) hours as needed for mild pain.    Marland Kitchen albuterol (PROVENTIL HFA;VENTOLIN HFA) 108 (90 Base) MCG/ACT inhaler Inhale 2 puffs into the lungs every 6 (six) hours as needed for wheezing or shortness of breath.    Marland Kitchen albuterol (PROVENTIL) (2.5 MG/3ML) 0.083% nebulizer solution Take 2.5 mg by nebulization every 6 (six) hours as needed for wheezing or shortness of breath.    . bacitracin 500 UNIT/GM ointment Apply 1 application topically 2 (two) times daily. 15 g 0  . budesonide-formoterol (SYMBICORT) 160-4.5 MCG/ACT inhaler Inhale 2 puffs into the lungs 2 (two) times daily.     Marland Kitchen buPROPion (WELLBUTRIN XL) 150 MG 24 hr tablet Take 1 tablet (150 mg total) by mouth daily. 90 tablet 1  . clonazePAM (KLONOPIN) 0.5 MG tablet Take 0.5 mg by mouth 3 (three) times daily.     Marland Kitchen dicyclomine (BENTYL) 10 MG capsule Take 10 mg by mouth 3 (three) times daily.     . ferrous sulfate 325 (65 FE) MG tablet Take 325 mg by mouth 2 (two) times daily.     . furosemide (LASIX) 80 MG tablet Take 80 mg by mouth.    . gabapentin (NEURONTIN) 600 MG tablet Take 1 tablet (600 mg total) by mouth 3 (three) times daily. 90 tablet 2  .  hydrochlorothiazide (MICROZIDE) 12.5 MG capsule Take 12.5 mg by mouth daily with supper.    Marland Kitchen HYDROcodone-acetaminophen (NORCO) 7.5-325 MG tablet Take 1 tablet by mouth every 6 (six) hours as needed for moderate pain.    Marland Kitchen insulin glargine (LANTUS) 100 UNIT/ML injection Inject 50 Units into the skin every evening.     . insulin lispro (HUMALOG) 100 UNIT/ML injection Inject 8 Units into the skin 3 (three) times daily before meals.     . lactulose (CHRONULAC) 10 GM/15ML solution Take 10 g by mouth daily. (0800)    . lamoTRIgine (LAMICTAL) 100 MG tablet Take 1 tablet (100 mg total) by mouth 2 (two) times daily. (0800 & 2000) 180 tablet 1  . lamoTRIgine (LAMICTAL) 25 MG tablet Take 1 tablet (25 mg total) by mouth 2 (two) times daily. To be added to the Lamictal 100 mg at 8 AM and 8 pm 180 tablet 1  . lidocaine (LIDODERM) 5 % Place 2 patches onto the skin daily. Apply 1 patch to 2 areas on back (2 patches) every morning and remove at bedtime.    . liraglutide (VICTOZA) 18 MG/3ML SOPN Inject 1.8 mg into the skin daily.    Marland Kitchen LYRICA 150 MG capsule Take 150 mg by mouth 3 (  three) times daily. (0800, 1400 & 2000)    . metFORMIN (GLUCOPHAGE-XR) 500 MG 24 hr tablet Take 500 mg by mouth twice daily.    . metoCLOPramide (REGLAN) 5 MG tablet Take 5 mg by mouth 4 (four) times daily -  before meals and at bedtime. (0800, 1200, 1600 & 2000)    . MYRBETRIQ 25 MG TB24 tablet     . nystatin (MYCOSTATIN/NYSTOP) powder Apply topically 4 (four) times daily. (Patient taking differently: Apply topically 4 (four) times daily. Apply to inner thigh/ peri area twice daily and apply to abdomen 4 times daily.) 15 g 0  . omeprazole (PRILOSEC) 20 MG capsule Take 20 mg by mouth daily.     Marland Kitchen oxybutynin (DITROPAN-XL) 5 MG 24 hr tablet Take 5 mg by mouth daily. (0800)    . RESTASIS 0.05 % ophthalmic emulsion     . rOPINIRole (REQUIP) 3 MG tablet Take 3 mg by mouth at bedtime. (2000)    . senna (GERI-KOT) 8.6 MG tablet Take 1 tablet by  mouth daily.    . simvastatin (ZOCOR) 10 MG tablet Take 10 mg by mouth at bedtime.    Marland Kitchen spironolactone (ALDACTONE) 50 MG tablet Take 50 mg by mouth 2 (two) times daily. (0800)    . trolamine salicylate (ASPERCREME) 10 % cream Apply 1 application topically 2 (two) times daily as needed for muscle pain.    Marland Kitchen zolpidem (AMBIEN) 10 MG tablet Take 1 tablet (10 mg total) by mouth at bedtime as needed for sleep. 30 tablet 3   No current facility-administered medications for this visit.     Past Medical History:  Diagnosis Date  . Adenomatous colon polyp 08/27/2014   Polyps x 3  . Anemia   . Anxiety   . 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 Arthritis   . Asthma   . Bipolar affective disorder ([place])    h/o  . Blood transfusion 2000  . Blood transfusion without reported diagnosis   . Cancer ([place])   . Chronic abdominal pain    abdominal wall pain  . 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 Colon polyp   . COPD (chronic obstructive pulmonary disease) ([place])   . Depression   . Diabetes mellitus without complication ([place])   . Fatty liver 04/09/08   found in abd CT  . GERD (gastroesophageal reflux disease)   . H/O: CVA (cardiovascular accident)    TIA  . H/O: rheumatic fever   . Hepatitis    HEP "B" twenty years ago  . Hyperlipidemia   . Hypertension   . IBS (irritable bowel syndrome)   . Incontinence   . Insomnia   . Lower extremity edema   . Neuropathy    lower legs D/T DM  . Obstructive sleep apnea    Use C-PAP twice per week  . Platelets decreased ([place])   . RLS (restless legs syndrome)   . Shortness of breath   . Sleep apnea   . Stroke [facility])    TIA- 2002  . Ulcer   . Unspecified disorders of nervous system   . Wears dentures    full upper and lower    Past Surgical History:  Procedure Laterality Date  . ABDOMINAL EXPLORATION SURGERY    . ABDOMINAL HYSTERECTOMY     total  . Axillary artery cannulation via 8-mm Hemashield graft, median sternotomy, extracorporeal  circulation with deep hypothermic circulatory arrest, repair of  aortic dessection  01/23/2009  Dr Arlyce Dice  . BACK SURGERY      x 5  . cataract     both eyes  . CHOLECYSTECTOMY    . COLONOSCOPY    . EYE SURGERY Bilateral    Cataract Extraction with IOL  . FACIAL LACERATION REPAIR Left 04/11/2018   Procedure: EARLOBE REPAIR;  Surgeon: Carloyn Manner, MD;  Location: [place];  Service: ENT;  Laterality: Left;  Diabetic - insulin and oral meds sleep apnea  . HEMORROIDECTOMY     and colon polyp removed  . KNEE ARTHROSCOPY WITH LATERAL RELEASE Right 11/15/2016   Procedure: KNEE ARTHROSCOPY WITH LATERAL RELEASE;  Surgeon: Corky Mull, MD;  Location: [facility];  Service: Orthopedics;  Laterality: Right;  . LIVER BIOPSY  04/10/2012   Procedure: LIVER BIOPSY;  Surgeon: Inda Castle, MD;  Location: [facility];  Service: Endoscopy;  Laterality: N/A;  ultrasound to mark order for abd limited/liver to be marked to be sent by [email or web address]   . LUMBAR FUSION  10/09  . LUMBAR WOUND DEBRIDEMENT  05/09/2011   Procedure: LUMBAR WOUND DEBRIDEMENT;  Surgeon: Dahlia Bailiff;  Location: [facility];  Service: Orthopedics;  Laterality: N/A;  IRRIGATION AND DEBRIDEMENT SPINAL WOUND  . POSTERIOR CERVICAL FUSION/FORAMINOTOMY    . ROTATOR CUFF REPAIR     left    Social History Social History   Tobacco Use  . Smoking status: Former Smoker    Packs/day: 1.50    Years: 50.00    Pack years: 75.00    Types: Cigarettes    Last attempt to quit: 04/06/2013    Years since quitting: 5.2  . Smokeless tobacco: Never Used  Substance Use Topics  . Alcohol use: No    Alcohol/week: 0.0 standard drinks  . Drug use: No      Family History Family History  Problem Relation Age of Onset  . Breast cancer Mother   . Lung cancer Mother   . Stomach cancer Father   . Diabetes Unknown        4 aunts, and 1 uncle  . Depression Brother   . Anxiety disorder Brother   . Colon cancer Neg Hx   . Esophageal  cancer Neg Hx   . Rectal cancer Neg Hx     Allergies  Allergen Reactions  . Doxycycline Hives, Swelling, Other (See Comments) and Nausea Only    Reaction:  Facial swelling  Face red and swollen; no difficulty breathing    REVIEW OF SYSTEMS(Negative unless checked)  Constitutional: [] ?Weight loss[] ?Fever[] ?Chills Cardiac:[] ?Chest pain[] ?Chest pressure[x] ?Palpitations [] ?Shortness of breath when laying flat [] ?Shortness of breath at rest [] ?Shortness of breath with exertion. Vascular: [] ?Pain in legs with walking[x] ?Pain in legsat rest[] ?Pain in legs when laying flat [] ?Claudication [] ?Pain in feet when walking [] ?Pain in feet at rest [] ?Pain in feet when laying flat [x] ?History of DVT [x] ?Phlebitis [x] ?Swelling in legs [] ?Varicose veins [] ?Non-healing ulcers Pulmonary: [] ?Uses home oxygen [] ?Productive cough[] ?Hemoptysis [] ?Wheeze [x] ?COPD [] ?Asthma Neurologic: [] ?Dizziness [] ?Blackouts [] ?Seizures [] ?History of stroke [] ?History of TIA[] ?Aphasia [] ?Temporary blindness[] ?Dysphagia [] ?Weaknessor numbness in arms [x] ?Weakness or numbnessin legs Musculoskeletal: [x] ?Arthritis [] ?Joint swelling [x] ?Joint pain [] ?Low back pain Hematologic:[] ?Easy bruising[] ?Easy bleeding [] ?Hypercoagulable state [] ?Anemic [] ?Hepatitis Gastrointestinal:[] ?Blood in stool[] ?Vomiting blood[x] ?Gastroesophageal reflux/heartburn[] ?Abdominal pain Genitourinary: [] ?Chronic kidney disease [] ?Difficulturination [] ?Frequenturination [] ?Burning with urination[] ?Hematuria Skin: [] ?Rashes [] ?Ulcers [] ?Wounds Psychological: [x] ?History of anxiety[] ?History of major depression.   Physical Examination  BP (!) 95/57 (BP Location: Right Arm, Patient Position: Sitting)   Pulse 88   Resp 16   Ht 5' 3"  (1.6 m)   Wt 215 lb (97.5  kg)   BMI 38.09 kg/m  Gen:  WD/WN, NAD Head: [place]/AT, No temporalis  wasting. Ear/Nose/Throat: Hearing grossly intact, nares w/o erythema or drainage Eyes: Conjunctiva clear. Sclera non-icteric Neck: Supple.  Trachea midline Pulmonary:  Good air movement, no use of accessory muscles.  Cardiac: irregular Vascular:  Vessel Right Left  Radial Palpable Palpable                          PT Not Palpable Not Palpable  DP 1+ Palpable 1+ Palpable   Gastrointestinal: soft, non-tender/non-distended.   Musculoskeletal: M/S 5/5 throughout.  No deformity or atrophy. 2+ RLE edema, 1-2+ LLE edema. Walks with a walker Neurologic: Sensation grossly intact in extremities.  Symmetrical.  Speech is fluent.  Psychiatric: Judgment intact, Mood & affect appropriate for pt's clinical situation. Dermatologic: No rashes or ulcers noted.  No cellulitis or open wounds.       Labs Recent Results (from the past 2160 hour(s))  Glucose, capillary     Status: Abnormal   Collection Time: 04/11/18  7:54 AM  Result Value Ref Range   Glucose-Capillary 116 (H) 70 - 99 mg/dL  Glucose, capillary     Status: Abnormal   Collection Time: 04/11/18  8:49 AM  Result Value Ref Range   Glucose-Capillary 112 (H) 70 - 99 mg/dL    Radiology No results found.  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 Also, involved in wound healing. On appropriate medications.May have caused some degree of neuropathy as well that could explain her lower extremity symptoms.   Essential hypertension blood pressure control important in reducing the progression of atherosclerotic disease. On appropriate oral medications.  Lymphedema Symptoms seem to be worse.  Needs more elevation, activity, and compression.  Pain in limb At this point, the patient symptoms appear to be significantly worse than they were at her last visit.  As such, it has been sometime since we have  done a venous study and I think repeating that would be reasonable.  Her pain may be neuropathic in nature given the type of pain but her swelling is certainly worse as well.  This will be checked in the near future at her convenience.  We will see her back following the study.    Leotis Pain, MD  07/04/2018 1:21 PM    This note was created with Dragon medical transcription system.  Any errors from dictation are purely unintentional

## 2018-07-04 IMAGING — MR MR KNEE*R* W/O CM
6 series · 36 of 40 positions shown · non-contrast
Comparison: MRI right knee 05/23/2016. Plain films of the right
knee 11/05/2017 and 09/13/2017.

CLINICAL DATA: Chronic right knee pain. The patient underwent
arthroscopic surgery in November 2016. The patient reports some recent
falls but no specific knee injury.

EXAM:
MRI OF THE RIGHT KNEE WITHOUT CONTRAST
TECHNIQUE: Multiplanar, multisequence MR imaging of the knee was performed. No
intravenous contrast was administered.

[Series 3: PD fat-sat · axial · 3.0mm · 0.50mm/px · z∈[-44,+68]mm · 8 of 35 slices shown (1 of 4)]
[im 1/35]
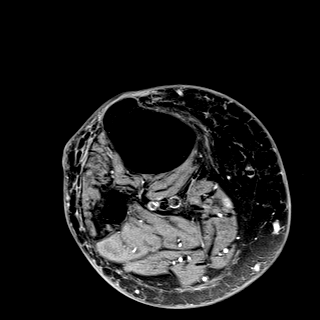
[im 5/35]
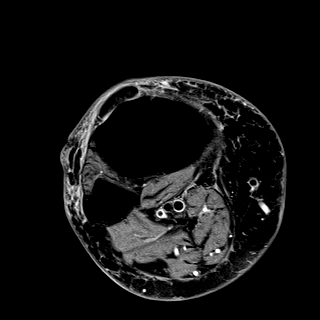
[im 10/35]
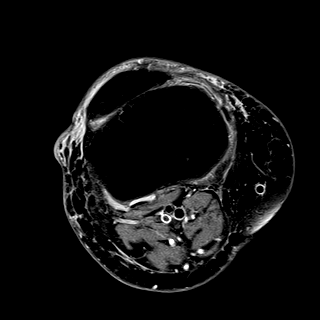
[im 15/35]
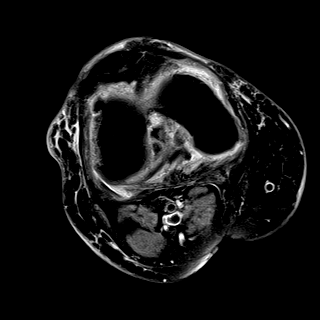
[im 20/35]
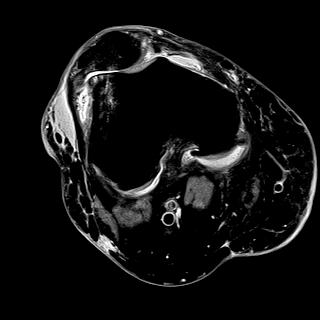
[im 25/35]
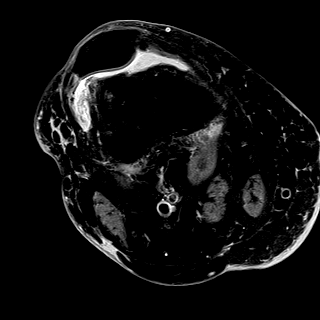
[im 30/35]
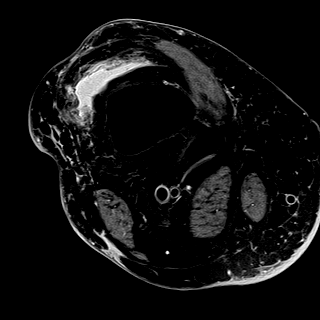
[im 35/35]
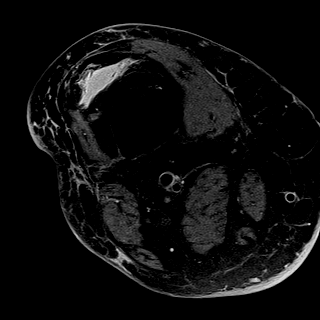

[Series 4: T1 · coronal · 3.0mm · 0.50mm/px · 3 of 30 slices shown]
[im 1/30]
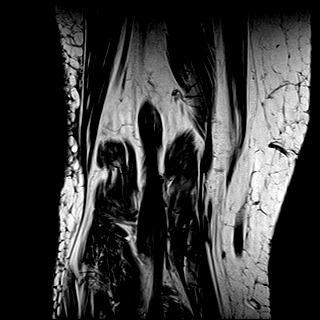
[im 5/30]
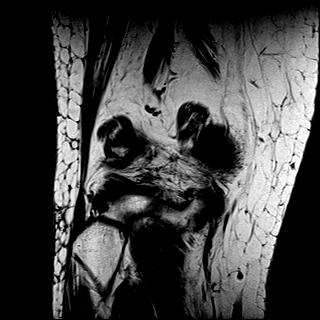
[im 10/30]
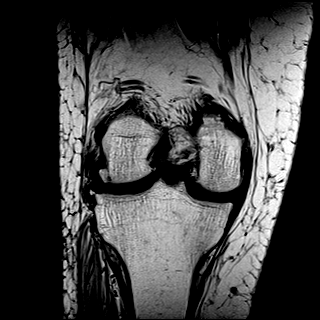

[Series 5: PD fat-sat · sagittal · 3.0mm · 0.50mm/px · 7 of 30 slices shown (2 of 4)]
[im 1/30]
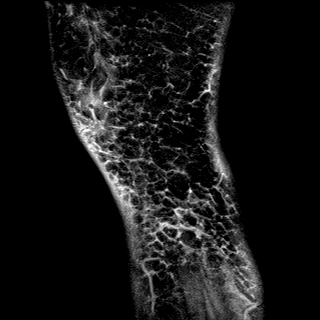
[im 5/30]
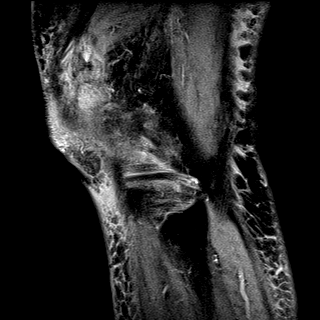
[im 10/30]
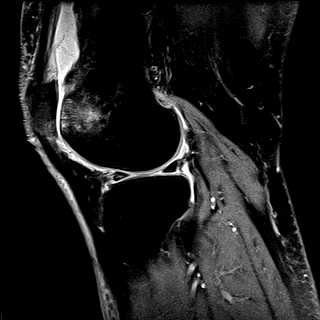
[im 15/30]
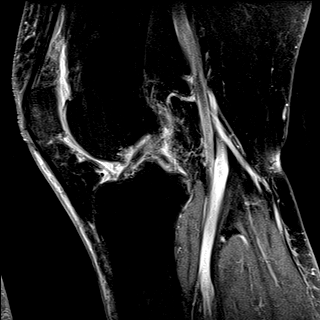
[im 20/30]
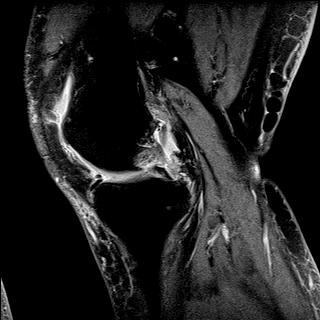
[im 25/30]
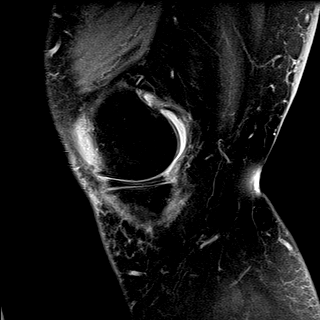
[im 30/30]
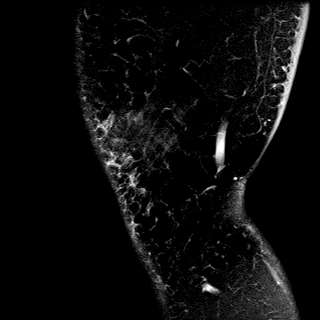

[Series 6: T2 fat-sat · coronal · 3.0mm · 0.31mm/px · 7 of 30 slices shown]
[im 1/30]
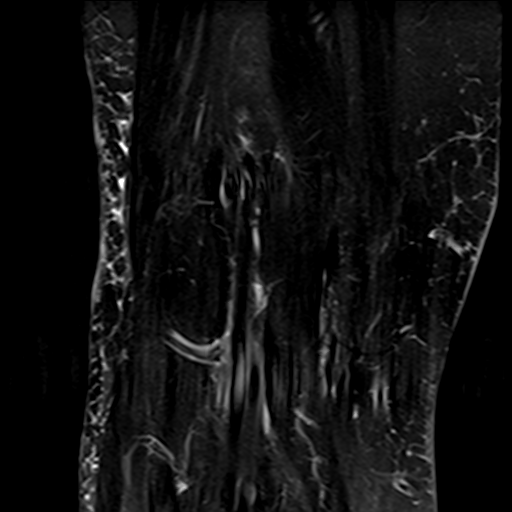
[im 5/30]
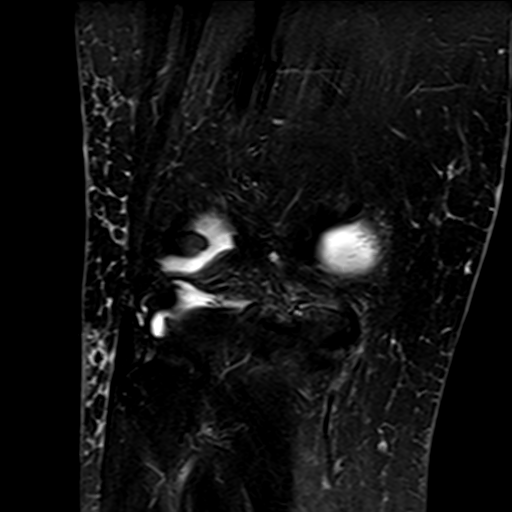
[im 10/30]
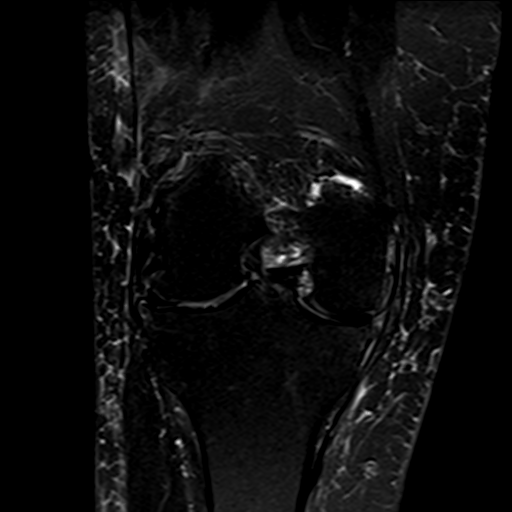
[im 15/30]
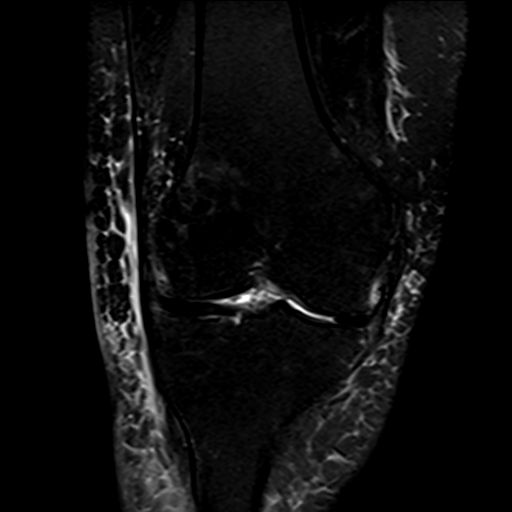
[im 20/30]
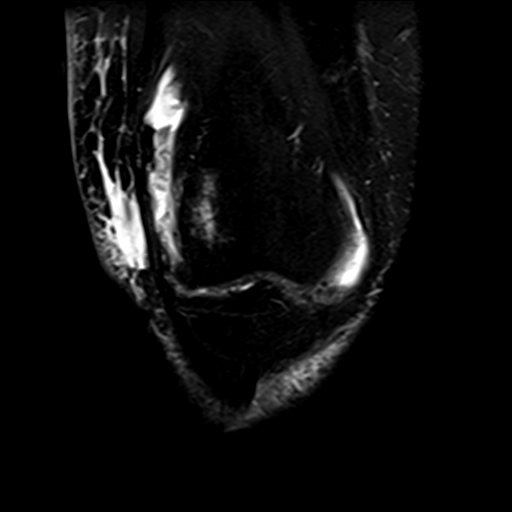
[im 25/30]
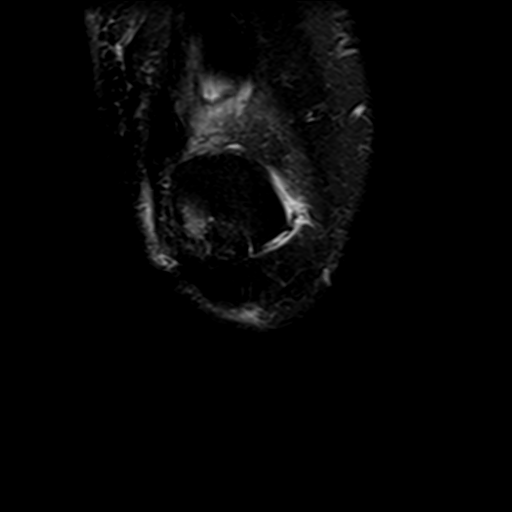
[im 30/30]
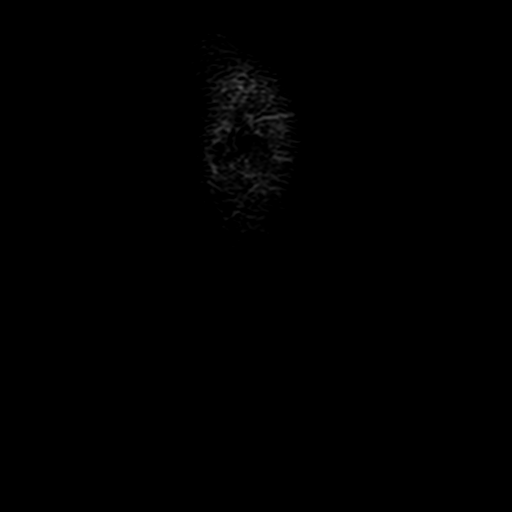

[Series 7: PD fat-sat · coronal · 3.0mm · 0.50mm/px · 7 of 30 slices shown (3 of 4)]
[im 1/30]
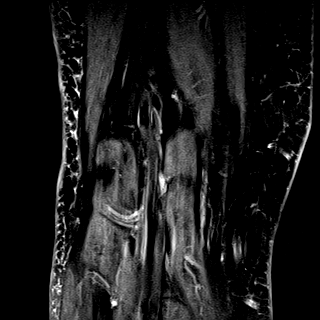
[im 5/30]
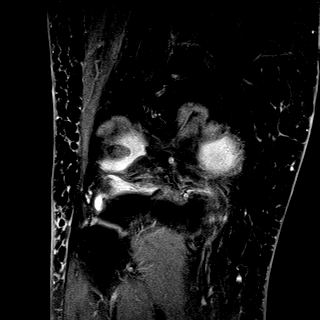
[im 10/30]
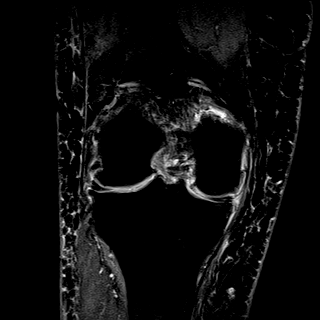
[im 15/30]
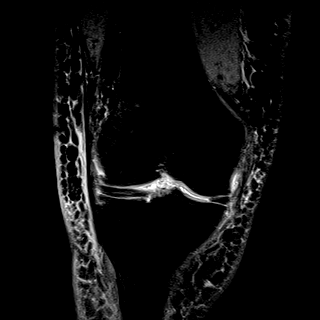
[im 20/30]
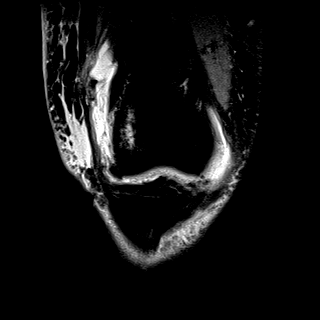
[im 25/30]
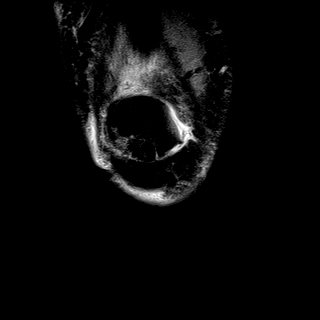
[im 30/30]
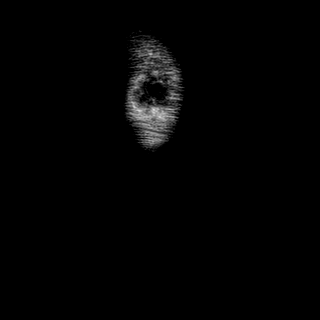

[Series 8: PD fat-sat · coronal · 2.0mm · 0.62mm/px · 4 of 17 slices shown (4 of 4)]
[im 1/17]
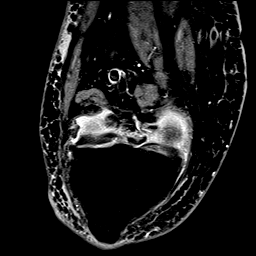
[im 6/17]
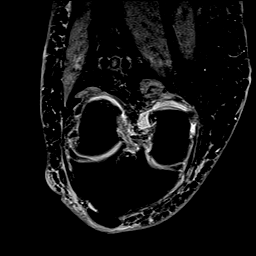
[im 11/17]
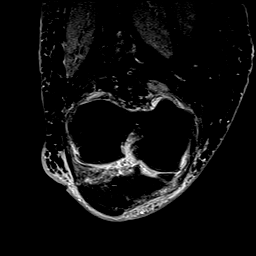
[im 17/17]
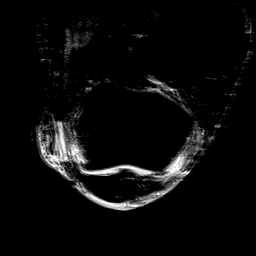

[36 of 40 positions shown; findings below may reference images not displayed]

FINDINGS: MENISCI

Medial meniscus: Horizontal tear in the posterior horn reaches the
meniscal undersurface. The body is degenerated and diminutive.

Lateral meniscus: There is some blunting along the free edge of the
posterior horn which may be due to fraying or debridement. No focal
tear.

LIGAMENTS

Cruciates:  Intact.

Collaterals:  Intact.

CARTILAGE

Patellofemoral: Marked cartilage loss along the lateral femoral
trochlea and lateral patellar facet persists. Associated subchondral
edema is again seen but appears improved, particularly in the
patella.

Medial:  Mildly thinned without focal defect, unchanged.

Lateral: Cartilage thinning and irregularity about the posterior
aspect of the lateral compartment have progressed since the prior
MRI.

Joint:  Small effusion.

Popliteal Fossa:  No Baker's cyst.

Extensor Mechanism: Intact with postoperative change of lateral
release noted.

Bones: No fracture or worrisome lesion. Osteophytosis about the knee
has increased since the prior study.

Other: None.
IMPRESSION: Horizontal tear posterior horn medial meniscus.

Medial and lateral compartment osteophytosis has progressed since
the prior examination. Marked patellofemoral degenerative disease is
again seen although subchondral edema in the lateral patellar facet
and femoral trochlea shows some improvement since the prior MRI.

## 2018-07-04 NOTE — Assessment & Plan Note (Signed)
Symptoms seem to be worse.  Needs more elevation, activity, and compression.

## 2018-07-04 NOTE — Assessment & Plan Note (Signed)
At this point, the patient symptoms appear to be significantly worse than they were at her last visit.  As such, it has been sometime since we have done a venous study and I think repeating that would be reasonable.  Her pain may be neuropathic in nature given the type of pain but her swelling is certainly worse as well.  This will be checked in the near future at her convenience.  We will see her back following the study.

## 2018-07-05 DIAGNOSIS — I340 Nonrheumatic mitral (valve) insufficiency: Secondary | ICD-10-CM | POA: Insufficient documentation

## 2018-07-08 ENCOUNTER — Emergency Department
Admission: EM | Admit: 2018-07-08 | Discharge: 2018-07-08 | Payer: Medicare Other | Attending: Emergency Medicine | Admitting: Emergency Medicine

## 2018-07-08 ENCOUNTER — Emergency Department: Payer: Medicare Other

## 2018-07-08 ENCOUNTER — Other Ambulatory Visit: Payer: Self-pay

## 2018-07-08 DIAGNOSIS — E109 Type 1 diabetes mellitus without complications: Secondary | ICD-10-CM | POA: Diagnosis not present

## 2018-07-08 DIAGNOSIS — S8001XA Contusion of right knee, initial encounter: Secondary | ICD-10-CM | POA: Insufficient documentation

## 2018-07-08 DIAGNOSIS — Y9210 Unspecified residential institution as the place of occurrence of the external cause: Secondary | ICD-10-CM | POA: Insufficient documentation

## 2018-07-08 DIAGNOSIS — Z794 Long term (current) use of insulin: Secondary | ICD-10-CM | POA: Diagnosis not present

## 2018-07-08 DIAGNOSIS — Z79899 Other long term (current) drug therapy: Secondary | ICD-10-CM | POA: Insufficient documentation

## 2018-07-08 DIAGNOSIS — S8991XA Unspecified injury of right lower leg, initial encounter: Secondary | ICD-10-CM | POA: Diagnosis present

## 2018-07-08 DIAGNOSIS — I10 Essential (primary) hypertension: Secondary | ICD-10-CM | POA: Diagnosis not present

## 2018-07-08 DIAGNOSIS — J449 Chronic obstructive pulmonary disease, unspecified: Secondary | ICD-10-CM | POA: Insufficient documentation

## 2018-07-08 DIAGNOSIS — W19XXXA Unspecified fall, initial encounter: Secondary | ICD-10-CM

## 2018-07-08 DIAGNOSIS — Y9301 Activity, walking, marching and hiking: Secondary | ICD-10-CM | POA: Insufficient documentation

## 2018-07-08 DIAGNOSIS — Y999 Unspecified external cause status: Secondary | ICD-10-CM | POA: Diagnosis not present

## 2018-07-08 DIAGNOSIS — W0110XA Fall on same level from slipping, tripping and stumbling with subsequent striking against unspecified object, initial encounter: Secondary | ICD-10-CM | POA: Insufficient documentation

## 2018-07-08 DIAGNOSIS — Z87891 Personal history of nicotine dependence: Secondary | ICD-10-CM | POA: Insufficient documentation

## 2018-07-08 DIAGNOSIS — M25561 Pain in right knee: Secondary | ICD-10-CM

## 2018-07-08 MED ORDER — OXYCODONE-ACETAMINOPHEN 5-325 MG PO TABS: 1.0000 | ORAL_TABLET | ORAL | 0 refills | Status: DC | PRN

## 2018-07-08 MED ORDER — OXYCODONE-ACETAMINOPHEN 5-325 MG PO TABS
1.0000 | ORAL_TABLET | Freq: Once | ORAL | Status: AC
  Administered 2018-07-08: 1 via ORAL
  Filled 2018-07-08: qty 1

## 2018-07-08 MED ORDER — KETOROLAC TROMETHAMINE 30 MG/ML IJ SOLN
30.0000 mg | Freq: Once | INTRAMUSCULAR | Status: AC
  Administered 2018-07-08: 30 mg via INTRAMUSCULAR
  Filled 2018-07-08: qty 1

## 2018-07-08 MED ORDER — HYDROCODONE-ACETAMINOPHEN 5-325 MG PO TABS: 1.0000 | ORAL_TABLET | Freq: Once | ORAL | Status: DC

## 2018-07-08 NOTE — ED Notes (Signed)
Every time I've passed pt's stretcher in the hallway to go to another pt's room, pt has been sleeping with eyes closed and even respirations; woke her for pain medication; pt says her knee is "aching like a toothache" and rates her pain 10/10

## 2018-07-08 NOTE — Discharge Instructions (Addendum)
1.  You may take pain medicines as needed (Motrin/Percocet).  Do not take your hydrocodone while you are taking Percocet. 2.  Wear knee immobilizer as needed for comfort.  Use your walker at all times to walk. 3.  Return to the ER for worsening symptoms, persistent vomiting, difficulty breathing or other concerns.

## 2018-07-08 NOTE — ED Notes (Signed)
Report called to [place] at [facility]

## 2018-07-08 NOTE — ED Notes (Signed)
Pt easily awakened; rates pain 0/10

## 2018-07-08 NOTE — ED Notes (Signed)
Pt has remained awake and sitting up since taking percocet; more water provided as requested;

## 2018-07-08 NOTE — ED Triage Notes (Signed)
EMS pt from [facility]; pt says she fell over her walker 2 days ago and is having right knee pain; pt says pain is worsening; difficulty walking now due to pain; tender on palpation above and below right knee and to patella; swelling present to inner area of knee;

## 2018-07-08 NOTE — ED Provider Notes (Signed)
[facility] Emergency Department Provider Note   ____________________________________________   First MD Initiated Contact with Patient 07/08/18 0111     (approximate)  I have reviewed the triage vital signs and the nursing notes.   HISTORY  Chief Complaint Fall and Knee Pain    HPI Stephanie [name] is a 68 y.o. female brought to the ED via EMS from the [facility] with a chief complaint of right knee pain.  Patient states she tripped over her walker 2 days ago and has been having right knee pain since.  Reports progressive pain and now with difficulty walking secondary to pain.  Denies striking head or LOC.  Denies use of anticoagulants.  Denies numbness or tingling.  Denies neck pain, chest pain, shortness of breath, abdominal pain, nausea or vomiting.    Past Medical History:  Diagnosis Date  . Adenomatous colon polyp 08/27/2014   Polyps x 3  . Anemia   . Anxiety   . 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 Arthritis   . Asthma   . Bipolar affective disorder ([place])    h/o  . Blood transfusion 2000  . Blood transfusion without reported diagnosis   . Cancer ([facility] [place] [facility])   . Chronic abdominal pain    abdominal wall pain  . 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 Colon polyp   . COPD (chronic obstructive pulmonary disease) ([place])   . Depression   . Diabetes mellitus without complication ([place])   . Fatty liver 04/09/08   found in abd CT  . GERD (gastroesophageal reflux disease)   . H/O: CVA (cardiovascular accident)    TIA  . H/O: rheumatic fever   . Hepatitis    HEP "B" twenty years ago  . Hyperlipidemia   . Hypertension   . IBS (irritable bowel syndrome)   . Incontinence   . Insomnia   . Lower extremity edema   . Neuropathy    lower legs D/T DM  . Obstructive sleep apnea    Use C-PAP twice per week  . Platelets decreased ([place])   . RLS (restless legs syndrome)   . Shortness of breath   . Sleep apnea   . Stroke [facility])    TIA- 2002  . Ulcer   .  Unspecified disorders of nervous system   . Wears dentures    full upper and lower    Patient Active Problem List   Diagnosis Date Noted  . Unsteady gait 04/09/2018  . Diabetic peripheral neuropathy associated with type 2 diabetes mellitus ([place]) 04/09/2018  . Laceration without foreign body, right lower leg, initial encounter 04/09/2018  . Chronic obstructive pulmonary disease, unspecified ([place]) 04/09/2018  . Unilateral primary osteoarthritis, right knee 04/09/2018  . Sleep apnea 04/09/2018  . Chronic hip pain, right 04/09/2018  . Closed impacted fracture of hip ([place]) 04/09/2018  . Tricompartment osteoarthritis of right knee 10/18/2017  . Sepsis ([place]) 07/20/2017  . Obesity, Class III, BMI 40-49.9 (morbid obesity) ([place]) 05/19/2017  . Cervical stenosis of spine 04/25/2017  . Stress fracture of femur 04/17/2017  . Elevated alkaline phosphatase level 04/16/2017  . Altered mental status 03/07/2017  . Degenerative tear of lateral meniscus of right knee 11/15/2016  . Encephalopathy 07/19/2016  . Degenerative tear of medial meniscus of right knee 07/01/2016  . Primary osteoarthritis of right knee 07/01/2016  . Diabetes mellitus type 1, uncontrolled, without complications ([place]) 99/77/4142  . Lymphedema 06/14/2016  . Pain in limb 05/06/2016  .  Swelling of limb 05/06/2016  . Postphlebitic syndrome with inflammation 05/06/2016  . Pneumonia 04/18/2016  . Chest pain 04/04/2016  . Iron deficiency anemia 01/30/2016  . Bilateral carotid artery stenosis 01/12/2016  . Hypoglycemia 09/09/2015  . Hyperglycemia 07/16/2015  . Neuropathy 06/08/2015  . Thrombocytopenia ([place]) 08/19/2014  . Abdominal pain, chronic, epigastric 06/12/2014  . Liver cirrhosis secondary to NASH ([place]) 02/27/2014  . Dizziness and giddiness 10/30/2013  . DOE (dyspnea on exertion) 10/30/2013  . Poorly controlled type 2 diabetes mellitus with complication ([place]) 25/36/6440  . DNR (do not resuscitate) 07/29/2013  . Encounter  for routine gynecological examination 07/29/2013  . Dyspnea 07/10/2013  . Lung nodule 07/10/2013  . Varicose veins of left lower extremity 10/09/2012  . Gastroesophageal reflux disease 10/09/2012  . Lower extremity edema 02/02/2012  . Cirrhosis of liver ([place]) 02/02/2012  . Anxiety disorder 07/11/2011  . Aortic dissection ([place])   . BACK PAIN, LUMBAR, CHRONIC 11/19/2009  . Chronic pain syndrome 11/06/2009  . UNS ADVRS EFF OTH RX MEDICINAL&BIOLOGICAL SBSTNC 12/16/2008  . Irritable bowel syndrome with constipation 10/23/2008  . UNSPECIFIED VITAMIN D DEFICIENCY 09/26/2008  . TOBACCO ABUSE 06/19/2008  . EDEMA, LOCALIZED 01/07/2008  . Restless legs syndrome 04/27/2007  . INSOMNIA 04/27/2007  . Hyperlipidemia 03/08/2007  . OSA (obstructive sleep apnea) 03/08/2007  . Hypertension 03/08/2007  . Asthma 03/08/2007  . Urinary incontinence 03/08/2007  . BIPOLAR AFFECTIVE DISORDER, HX OF 03/08/2007    Past Surgical History:  Procedure Laterality Date  . ABDOMINAL EXPLORATION SURGERY    . ABDOMINAL HYSTERECTOMY     total  . Axillary artery cannulation via 8-mm Hemashield graft, median sternotomy, extracorporeal circulation with deep hypothermic circulatory arrest, repair of  aortic dessection  01/23/2009   Dr Arlyce Dice  . BACK SURGERY      x 5  . cataract     both eyes  . CHOLECYSTECTOMY    . COLONOSCOPY    . EYE SURGERY Bilateral    Cataract Extraction with IOL  . FACIAL LACERATION REPAIR Left 04/11/2018   Procedure: EARLOBE REPAIR;  Surgeon: Carloyn Manner, MD;  Location: [facility];  Service: ENT;  Laterality: Left;  Diabetic - insulin and oral meds sleep apnea  . HEMORROIDECTOMY     and colon polyp removed  . KNEE ARTHROSCOPY WITH LATERAL RELEASE Right 11/15/2016   Procedure: KNEE ARTHROSCOPY WITH LATERAL RELEASE;  Surgeon: Corky Mull, MD;  Location: [facility];  Service: Orthopedics;  Laterality: Right;  . LIVER BIOPSY  04/10/2012   Procedure: LIVER BIOPSY;  Surgeon:  Inda Castle, MD;  Location: [facility];  Service: Endoscopy;  Laterality: N/A;  ultrasound to mark order for abd limited/liver to be marked to be sent by [email or web address]   . LUMBAR FUSION  10/09  . LUMBAR WOUND DEBRIDEMENT  05/09/2011   Procedure: LUMBAR WOUND DEBRIDEMENT;  Surgeon: Dahlia Bailiff;  Location: [facility];  Service: Orthopedics;  Laterality: N/A;  IRRIGATION AND DEBRIDEMENT SPINAL WOUND  . POSTERIOR CERVICAL FUSION/FORAMINOTOMY    . ROTATOR CUFF REPAIR     left    Prior to Admission medications   Medication Sig Start Date End Date Taking? Authorizing Provider  acetaminophen (TYLENOL) 325 MG tablet Take 650 mg by mouth every 6 (six) hours as needed for mild pain.    [provider]  albuterol (PROVENTIL HFA;VENTOLIN HFA) 108 (90 Base) MCG/ACT inhaler Inhale 2 puffs into the lungs every 6 (six) hours as needed for wheezing or shortness of breath.    [provider]  albuterol (PROVENTIL) (2.5 MG/3ML) 0.083% nebulizer solution Take 2.5 mg by nebulization every 6 (six) hours as needed for wheezing or shortness of breath.    [provider]  bacitracin 500 UNIT/GM ointment Apply 1 application topically 2 (two) times daily. 04/11/18   Carloyn Manner, MD  budesonide-formoterol (SYMBICORT) 160-4.5 MCG/ACT inhaler Inhale 2 puffs into the lungs 2 (two) times daily.     [provider]  buPROPion (WELLBUTRIN XL) 150 MG 24 hr tablet Take 1 tablet (150 mg total) by mouth daily. 01/18/18   Ursula Alert, MD  clonazePAM (KLONOPIN) 0.5 MG tablet Take 0.5 mg by mouth 3 (three) times daily.     [provider]  dicyclomine (BENTYL) 10 MG capsule Take 10 mg by mouth 3 (three) times daily.     [provider]  ferrous sulfate 325 (65 FE) MG tablet Take 325 mg by mouth 2 (two) times daily.     [provider]  furosemide (LASIX) 80 MG tablet Take 80 mg by mouth.    [provider]  gabapentin (NEURONTIN) 600 MG tablet Take 1  tablet (600 mg total) by mouth 3 (three) times daily. 04/09/18 07/08/18  Vevelyn Francois, NP  hydrochlorothiazide (MICROZIDE) 12.5 MG capsule Take 12.5 mg by mouth daily with supper.    [provider]  HYDROcodone-acetaminophen (NORCO) 7.5-325 MG tablet Take 1 tablet by mouth every 6 (six) hours as needed for moderate pain.    [provider]  insulin glargine (LANTUS) 100 UNIT/ML injection Inject 50 Units into the skin every evening.     [provider]  insulin lispro (HUMALOG) 100 UNIT/ML injection Inject 8 Units into the skin 3 (three) times daily before meals.     [provider]  lactulose (CHRONULAC) 10 GM/15ML solution Take 10 g by mouth daily. (0800) 10/17/16   [provider]  lamoTRIgine (LAMICTAL) 100 MG tablet Take 1 tablet (100 mg total) by mouth 2 (two) times daily. (0800 & 2000) 01/18/18   Ursula Alert, MD  lamoTRIgine (LAMICTAL) 25 MG tablet Take 1 tablet (25 mg total) by mouth 2 (two) times daily. To be added to the Lamictal 100 mg at 8 AM and 8 pm 01/18/18   Eappen, Ria Clock, MD  lidocaine (LIDODERM) 5 % Place 2 patches onto the skin daily. Apply 1 patch to 2 areas on back (2 patches) every morning and remove at bedtime.    [provider]  liraglutide (VICTOZA) 18 MG/3ML SOPN Inject 1.8 mg into the skin daily.    [provider]  LYRICA 150 MG capsule Take 150 mg by mouth 3 (three) times daily. (0800, 1400 & 2000) 11/29/16   [provider]  metFORMIN (GLUCOPHAGE-XR) 500 MG 24 hr tablet Take 500 mg by mouth twice daily. 09/04/17   [provider]  metoCLOPramide (REGLAN) 5 MG tablet Take 5 mg by mouth 4 (four) times daily -  before meals and at bedtime. (0800, 1200, 1600 & 2000)    [provider]  MYRBETRIQ 25 MG TB24 tablet  01/03/18   [provider]  nystatin (MYCOSTATIN/NYSTOP) powder Apply topically 4 (four) times daily. Patient taking differently: Apply topically 4 (four) times daily.  Apply to inner thigh/ peri area twice daily and apply to abdomen 4 times daily. 08/07/17   Lequita Asal, MD  omeprazole (PRILOSEC) 20 MG capsule Take 20 mg by mouth daily.     [provider]  oxybutynin (DITROPAN-XL) 5 MG 24 hr  tablet Take 5 mg by mouth daily. (0800)    [provider]  oxyCODONE-acetaminophen (PERCOCET/ROXICET) 5-325 MG tablet Take 1 tablet by mouth every 4 (four) hours as needed for severe pain. 07/08/18   Paulette Blanch, MD  RESTASIS 0.05 % ophthalmic emulsion  01/03/18   [provider]  rOPINIRole (REQUIP) 3 MG tablet Take 3 mg by mouth at bedtime. (2000)    [provider]  senna (GERI-KOT) 8.6 MG tablet Take 1 tablet by mouth daily.    [provider]  simvastatin (ZOCOR) 10 MG tablet Take 10 mg by mouth at bedtime.    [provider]  spironolactone (ALDACTONE) 50 MG tablet Take 50 mg by mouth 2 (two) times daily. (0800)    [provider]  trolamine salicylate (ASPERCREME) 10 % cream Apply 1 application topically 2 (two) times daily as needed for muscle pain.    [provider]  zolpidem (AMBIEN) 10 MG tablet Take 1 tablet (10 mg total) by mouth at bedtime as needed for sleep. 05/10/18   Ursula Alert, MD    Allergies Doxycycline  Family History  Problem Relation Age of Onset  . Breast cancer Mother   . Lung cancer Mother   . Stomach cancer Father   . Diabetes Unknown        4 aunts, and 1 uncle  . Depression Brother   . Anxiety disorder Brother   . Colon cancer Neg Hx   . Esophageal cancer Neg Hx   . Rectal cancer Neg Hx     Social History Social History   Tobacco Use  . Smoking status: Former Smoker    Packs/day: 1.50    Years: 50.00    Pack years: 75.00    Types: Cigarettes    Last attempt to quit: 04/06/2013    Years since quitting: 5.2  . Smokeless tobacco: Never Used  Substance Use Topics  . Alcohol use: No    Alcohol/week: 0.0 standard drinks  . Drug use: No     Review of Systems  Constitutional: No fever/chills Eyes: No visual changes. ENT: No sore throat. Cardiovascular: Denies chest pain. Respiratory: Denies shortness of breath. Gastrointestinal: No abdominal pain.  No nausea, no vomiting.  No diarrhea.  No constipation. Genitourinary: Negative for dysuria. Musculoskeletal: Positive for right knee pain.  Negative for back pain. Skin: Negative for rash. Neurological: Negative for headaches, focal weakness or numbness.   ____________________________________________   PHYSICAL EXAM:  VITAL SIGNS: ED Triage Vitals  Enc Vitals Group     BP 07/08/18 0044 (!) 109/57     Pulse Rate 07/08/18 0044 76     Resp 07/08/18 0044 17     Temp 07/08/18 0044 98 F (36.7 C)     Temp Source 07/08/18 0044 Oral     SpO2 07/08/18 0044 97 %     Weight 07/08/18 0047 215 lb (97.5 kg)     Height 07/08/18 0047 5' 3"  (1.6 m)     Head Circumference --      Peak Flow --      Pain Score 07/08/18 0046 10     Pain Loc --      Pain Edu? --      Excl. in [place]? --     Constitutional: Alert and oriented. Well appearing and in mild acute distress. Eyes: Conjunctivae are normal. PERRL. EOMI. Head: Atraumatic. Nose: Atraumatic. Mouth/Throat: Mucous membranes are moist.  No dental malocclusion. Neck: No stridor.  No cervical spine tenderness  to palpation. Cardiovascular: Normal rate, regular rhythm. Grossly normal heart sounds.  Good peripheral circulation. Respiratory: Normal respiratory effort.  No retractions. Lungs CTAB. Gastrointestinal: Soft and nontender. No distention. No abdominal bruits. No CVA tenderness. Musculoskeletal: Right medial knee with small effusion and tenderness to palpation.  Limited range of motion secondary to pain.  No calf tenderness.  2+ distal pulses.  Brisk, less than 5-second capillary refill.   Neurologic:  Normal speech and language. No gross focal neurologic deficits are appreciated.  Skin:  Skin is warm, dry and intact. No  rash noted. Psychiatric: Mood and affect are normal. Speech and behavior are normal.  ____________________________________________   LABS (all labs ordered are listed, but only abnormal results are displayed)  Labs Reviewed - No data to display ____________________________________________  EKG  None ____________________________________________  RADIOLOGY  ED MD interpretation: No acute fracture or dislocation  Official radiology report(s): Dg Knee Complete 4 Views Right  Result Date: 07/08/2018 CLINICAL DATA:  Patient fell over her walker 2 days ago and has right knee pain. EXAM: RIGHT KNEE - COMPLETE 4+ VIEW COMPARISON:  09/13/2017 FINDINGS: Mild tricompartmental osteoarthritis of the knee with joint space narrowing and spurring is identified of the femorotibial and patellofemoral compartments. No acute fracture or joint dislocation. No joint effusion. Scattered femoral through tibial arteriosclerosis is identified. IMPRESSION: Tricompartmental osteoarthritis of the knee without acute osseous abnormality. Electronically Signed   By: Ashley Royalty M.D.   On: 07/08/2018 01:29    ____________________________________________   PROCEDURES  Procedure(s) performed: None  Procedures  Critical Care performed: No  ____________________________________________   INITIAL IMPRESSION / ASSESSMENT AND PLAN / ED COURSE  As part of my medical decision making, I reviewed the following data within the [facility] notes reviewed and incorporated, Old chart reviewed, Radiograph reviewed and Notes from prior ED visits    68 year old female who presents with right knee pain status post mechanical fall 2 days ago.  Will image, administer NSAIDs, analgesia and reassess.   Clinical Course as of Jul 09 207  Sun Jul 08, 2018  0200 Updated patient on x-ray results.  Will place in knee immobilizer, prescription for analgesia and patient will follow-up with orthopedics.   Strict return precautions given.  Patient verbalizes understanding and agrees with plan of care.   [JS]    Clinical Course User Index [JS] Paulette Blanch, MD     ____________________________________________   FINAL CLINICAL IMPRESSION(S) / ED DIAGNOSES  Final diagnoses:  Fall, initial encounter  Acute pain of right knee  Contusion of right knee, initial encounter     ED Discharge Orders         Ordered    oxyCODONE-acetaminophen (PERCOCET/ROXICET) 5-325 MG tablet  Every 4 hours PRN     07/08/18 0207           Note:  This document was prepared using Dragon voice recognition software and may include unintentional dictation errors.    Paulette Blanch, MD 07/08/18 [phone]

## 2018-07-10 ENCOUNTER — Encounter: Payer: Medicare Other | Admitting: Nurse Practitioner

## 2018-07-11 ENCOUNTER — Encounter (INDEPENDENT_AMBULATORY_CARE_PROVIDER_SITE_OTHER): Payer: Self-pay | Admitting: Nurse Practitioner

## 2018-07-11 ENCOUNTER — Ambulatory Visit
Admission: RE | Admit: 2018-07-11 | Discharge: 2018-07-11 | Disposition: A | Discharge status: Home / Self Care | Payer: Medicare Other | Source: Ambulatory Visit | Attending: Hematology and Oncology | Admitting: Hematology and Oncology

## 2018-07-11 ENCOUNTER — Other Ambulatory Visit
Admission: RE | Admit: 2018-07-11 | Discharge: 2018-07-11 | Disposition: A | Discharge status: Home / Self Care | Payer: Medicare Other | Source: Ambulatory Visit | Attending: Urgent Care | Admitting: Urgent Care

## 2018-07-11 ENCOUNTER — Ambulatory Visit (INDEPENDENT_AMBULATORY_CARE_PROVIDER_SITE_OTHER): Payer: Medicare Other | Admitting: Nurse Practitioner

## 2018-07-11 ENCOUNTER — Ambulatory Visit (INDEPENDENT_AMBULATORY_CARE_PROVIDER_SITE_OTHER): Payer: Medicare Other

## 2018-07-11 VITALS — BP 97/62 | HR 84 | Resp 16 | Ht 63.0 in | Wt 218.0 lb

## 2018-07-11 DIAGNOSIS — E785 Hyperlipidemia, unspecified: Secondary | ICD-10-CM | POA: Diagnosis not present

## 2018-07-11 DIAGNOSIS — M79605 Pain in left leg: Secondary | ICD-10-CM | POA: Diagnosis not present

## 2018-07-11 DIAGNOSIS — K7460 Unspecified cirrhosis of liver: Secondary | ICD-10-CM | POA: Diagnosis present

## 2018-07-11 DIAGNOSIS — K7581 Nonalcoholic steatohepatitis (NASH): Secondary | ICD-10-CM | POA: Insufficient documentation

## 2018-07-11 DIAGNOSIS — M79604 Pain in right leg: Secondary | ICD-10-CM | POA: Diagnosis not present

## 2018-07-11 DIAGNOSIS — I890 Lymphedema, not elsewhere classified: Secondary | ICD-10-CM

## 2018-07-11 DIAGNOSIS — Z87891 Personal history of nicotine dependence: Secondary | ICD-10-CM

## 2018-07-11 DIAGNOSIS — I10 Essential (primary) hypertension: Secondary | ICD-10-CM

## 2018-07-11 DIAGNOSIS — D696 Thrombocytopenia, unspecified: Secondary | ICD-10-CM

## 2018-07-11 DIAGNOSIS — I87029 Postthrombotic syndrome with inflammation of unspecified lower extremity: Secondary | ICD-10-CM

## 2018-07-11 LAB — CBC WITH DIFFERENTIAL/PLATELET
ABS IMMATURE GRANULOCYTES: 0.02 10*3/uL (ref 0.00–0.07)
BASOS PCT: 0 %
Basophils Absolute: 0 10*3/uL (ref 0.0–0.1)
EOS PCT: 1 %
Eosinophils Absolute: 0.1 10*3/uL (ref 0.0–0.5)
HCT: 34.4 % — ABNORMAL LOW (ref 36.0–46.0)
HEMOGLOBIN: 11 g/dL — AB (ref 12.0–15.0)
Immature Granulocytes: 0 %
Lymphocytes Relative: 6 %
Lymphs Abs: 0.5 10*3/uL — ABNORMAL LOW (ref 0.7–4.0)
MCH: 29.9 pg (ref 26.0–34.0)
MCHC: 32 g/dL (ref 30.0–36.0)
MCV: 93.5 fL (ref 80.0–100.0)
MONO ABS: 0.8 10*3/uL (ref 0.1–1.0)
MONOS PCT: 10 %
NRBC: 0 % (ref 0.0–0.2)
Neutro Abs: 6.8 10*3/uL (ref 1.7–7.7)
Neutrophils Relative %: 83 %
PLATELETS: 91 10*3/uL — AB (ref 150–400)
RBC: 3.68 MIL/uL — AB (ref 3.87–5.11)
RDW: 15.9 % — ABNORMAL HIGH (ref 11.5–15.5)
WBC: 8.2 10*3/uL (ref 4.0–10.5)

## 2018-07-13 ENCOUNTER — Encounter: Payer: Self-pay | Admitting: Hematology and Oncology

## 2018-07-13 ENCOUNTER — Inpatient Hospital Stay: Payer: Medicare Other | Attending: Hematology and Oncology | Admitting: Hematology and Oncology

## 2018-07-13 VITALS — BP 114/71 | HR 71 | Temp 96.6°F | Resp 18 | Wt 216.3 lb

## 2018-07-13 DIAGNOSIS — Z801 Family history of malignant neoplasm of trachea, bronchus and lung: Secondary | ICD-10-CM | POA: Diagnosis not present

## 2018-07-13 DIAGNOSIS — D649 Anemia, unspecified: Secondary | ICD-10-CM | POA: Insufficient documentation

## 2018-07-13 DIAGNOSIS — R634 Abnormal weight loss: Secondary | ICD-10-CM | POA: Insufficient documentation

## 2018-07-13 DIAGNOSIS — K7460 Unspecified cirrhosis of liver: Secondary | ICD-10-CM | POA: Diagnosis not present

## 2018-07-13 DIAGNOSIS — Z803 Family history of malignant neoplasm of breast: Secondary | ICD-10-CM | POA: Insufficient documentation

## 2018-07-13 DIAGNOSIS — E1140 Type 2 diabetes mellitus with diabetic neuropathy, unspecified: Secondary | ICD-10-CM | POA: Insufficient documentation

## 2018-07-13 DIAGNOSIS — D696 Thrombocytopenia, unspecified: Secondary | ICD-10-CM

## 2018-07-13 DIAGNOSIS — Z8673 Personal history of transient ischemic attack (TIA), and cerebral infarction without residual deficits: Secondary | ICD-10-CM

## 2018-07-13 DIAGNOSIS — Z800 Family history of malignant neoplasm of digestive organs: Secondary | ICD-10-CM | POA: Diagnosis not present

## 2018-07-13 DIAGNOSIS — K219 Gastro-esophageal reflux disease without esophagitis: Secondary | ICD-10-CM | POA: Diagnosis not present

## 2018-07-13 DIAGNOSIS — M129 Arthropathy, unspecified: Secondary | ICD-10-CM | POA: Insufficient documentation

## 2018-07-13 DIAGNOSIS — Z79899 Other long term (current) drug therapy: Secondary | ICD-10-CM | POA: Diagnosis not present

## 2018-07-13 DIAGNOSIS — R4182 Altered mental status, unspecified: Secondary | ICD-10-CM | POA: Diagnosis not present

## 2018-07-13 DIAGNOSIS — J449 Chronic obstructive pulmonary disease, unspecified: Secondary | ICD-10-CM | POA: Diagnosis not present

## 2018-07-13 DIAGNOSIS — K7581 Nonalcoholic steatohepatitis (NASH): Secondary | ICD-10-CM

## 2018-07-13 DIAGNOSIS — F0390 Unspecified dementia without behavioral disturbance: Secondary | ICD-10-CM | POA: Insufficient documentation

## 2018-07-13 DIAGNOSIS — R748 Abnormal levels of other serum enzymes: Secondary | ICD-10-CM | POA: Insufficient documentation

## 2018-07-13 DIAGNOSIS — Z87891 Personal history of nicotine dependence: Secondary | ICD-10-CM

## 2018-07-13 DIAGNOSIS — E785 Hyperlipidemia, unspecified: Secondary | ICD-10-CM | POA: Insufficient documentation

## 2018-07-13 DIAGNOSIS — R161 Splenomegaly, not elsewhere classified: Secondary | ICD-10-CM | POA: Diagnosis not present

## 2018-07-13 DIAGNOSIS — I10 Essential (primary) hypertension: Secondary | ICD-10-CM | POA: Insufficient documentation

## 2018-07-13 DIAGNOSIS — F419 Anxiety disorder, unspecified: Secondary | ICD-10-CM

## 2018-07-13 DIAGNOSIS — Z86010 Personal history of colonic polyps: Secondary | ICD-10-CM | POA: Insufficient documentation

## 2018-07-13 DIAGNOSIS — F319 Bipolar disorder, unspecified: Secondary | ICD-10-CM | POA: Diagnosis not present

## 2018-07-13 DIAGNOSIS — D509 Iron deficiency anemia, unspecified: Secondary | ICD-10-CM

## 2018-07-13 NOTE — Progress Notes (Signed)
Pt here for follow up. Denies any concerns at this time.  

## 2018-07-13 NOTE — Progress Notes (Signed)
[facility] day:  07/13/2018   Chief Complaint: Stephanie [name] is a 68 y.o. female with chronic thrombocytopenia and iron deficiency anemia who is seen for 5 month assessment.  HPI: The patient was last seen in the medical oncology clinic on  02/12/2018.  At that time, she noted knee pain.  She had lost weight.  Exam was stable.  Platelet count was 102,000.  She remained at the [facility].  She underwent repair of a left earlobe laceration by Dr. Carloyn Manner on 04/11/2018.  She was seen by Dr. Lucky Cowboy on 07/03/2018.  Notes reviewed.  She noted worsening pain and swelling in her legs.  Pain was felt possibly neuropathic.  She was seen in the [facility] ER on 07/08/2018 following a fall with associated RIGHT knee pain. She tripped over a walker 2 days prior.  Knee films revealed tricompartmental osteoarthritis of the knee without acute osseous abnormality.  She was put in a knee immobilizer and prescribed analgesia.  RUQ ultrasound on 07/11/2018 revealed a cirrhotic liver, surgically absent gallbladder, and no other abnormalities.  During the interim, patient notes that things have been going "pretty good" as of late. She complaints of continued RIGHT knee pain following mechanical fall on 07/08/2018. She is not wearing the prescribed immobilizer citing that her doctor told her that it "wasn't a good one". Patient advising that a custom immobilizer has been ordered, however she has yet to be fitted for it.   Patient denies bleeding; no hematochezia, melena, or gross hematuria. She bruises easily. Patient with intermittent AMS related to her underlying cirrhosis. Patient remains on lactulose as prescribed. Of note, patient is seen regularly by psychiatry and advised that her memory was declining, consistent with senescent changes (dementia).   Patient advises that she maintains an adequate appetite. She is eating well. Weight today is 216 lb 4.8 oz (98.1  kg), which compared to her last visit to the clinic, represents an 8 pound increase.     Patient denies pain in the clinic today.   Past Medical History:  Diagnosis Date  . Adenomatous colon polyp 08/27/2014   Polyps x 3  . Anemia   . Anxiety   . 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 Arthritis   . Asthma   . Bipolar affective disorder ([place])    h/o  . Blood transfusion 2000  . Blood transfusion without reported diagnosis   . Cancer ([place])   . Chronic abdominal pain    abdominal wall pain  . 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 Colon polyp   . COPD (chronic obstructive pulmonary disease) ([place])   . Depression   . Diabetes mellitus without complication ([place])   . Fatty liver 04/09/08   found in abd CT  . GERD (gastroesophageal reflux disease)   . H/O: CVA (cardiovascular accident)    TIA  . H/O: rheumatic fever   . Hepatitis    HEP "B" twenty years ago  . Hyperlipidemia   . Hypertension   . IBS (irritable bowel syndrome)   . Incontinence   . Insomnia   . Lower extremity edema   . Neuropathy    lower legs D/T DM  . Obstructive sleep apnea    Use C-PAP twice per week  . Platelets decreased ([place])   . RLS (restless legs syndrome)   . Shortness of breath   . Sleep apnea   . Stroke [facility])  TIA- 2002  . Ulcer   . Unspecified disorders of nervous system   . Wears dentures    full upper and lower    Past Surgical History:  Procedure Laterality Date  . ABDOMINAL EXPLORATION SURGERY    . ABDOMINAL HYSTERECTOMY     total  . Axillary artery cannulation via 8-mm Hemashield graft, median sternotomy, extracorporeal circulation with deep hypothermic circulatory arrest, repair of  aortic dessection  01/23/2009   Dr Arlyce Dice  . BACK SURGERY      x 5  . cataract     both eyes  . CHOLECYSTECTOMY    . COLONOSCOPY    . EYE SURGERY Bilateral    Cataract Extraction with IOL  . FACIAL LACERATION REPAIR Left 04/11/2018   Procedure: EARLOBE REPAIR;  Surgeon: Carloyn Manner,  MD;  Location: [facility];  Service: ENT;  Laterality: Left;  Diabetic - insulin and oral meds sleep apnea  . HEMORROIDECTOMY     and colon polyp removed  . KNEE ARTHROSCOPY WITH LATERAL RELEASE Right 11/15/2016   Procedure: KNEE ARTHROSCOPY WITH LATERAL RELEASE;  Surgeon: Corky Mull, MD;  Location: [facility];  Service: Orthopedics;  Laterality: Right;  . LIVER BIOPSY  04/10/2012   Procedure: LIVER BIOPSY;  Surgeon: Inda Castle, MD;  Location: [facility];  Service: Endoscopy;  Laterality: N/A;  ultrasound to mark order for abd limited/liver to be marked to be sent by [email or web address]   . LUMBAR FUSION  10/09  . LUMBAR WOUND DEBRIDEMENT  05/09/2011   Procedure: LUMBAR WOUND DEBRIDEMENT;  Surgeon: Dahlia Bailiff;  Location: [facility];  Service: Orthopedics;  Laterality: N/A;  IRRIGATION AND DEBRIDEMENT SPINAL WOUND  . POSTERIOR CERVICAL FUSION/FORAMINOTOMY    . ROTATOR CUFF REPAIR     left    Family History  Problem Relation Age of Onset  . Breast cancer Mother   . Lung cancer Mother   . Stomach cancer Father   . Diabetes Unknown        4 aunts, and 1 uncle  . Depression Brother   . Anxiety disorder Brother   . Colon cancer Neg Hx   . Esophageal cancer Neg Hx   . Rectal cancer Neg Hx     Social History:  reports that she quit smoking about 5 years ago. Her smoking use included cigarettes. She has a 75.00 pack-year smoking history. She has never used smokeless tobacco. She reports that she does not drink alcohol or use drugs.  She is a resident at the [facility].  She is accompanied by Lysle Morales, the transportation specialist from [employer], today.  Allergies:  Allergies  Allergen Reactions  . Cortisone     Increased BS  . Doxycycline Hives, Swelling, Other (See Comments) and Nausea Only    Reaction:  Facial swelling  Face red and swollen; no difficulty breathing    Current Medications: Current Outpatient Medications  Medication Sig Dispense Refill  . albuterol  (PROVENTIL HFA;VENTOLIN HFA) 108 (90 Base) MCG/ACT inhaler Inhale 2 puffs into the lungs every 6 (six) hours as needed for wheezing or shortness of breath.    Marland Kitchen albuterol (PROVENTIL) (2.5 MG/3ML) 0.083% nebulizer solution Take 2.5 mg by nebulization every 6 (six) hours as needed for wheezing or shortness of breath.    . bacitracin 500 UNIT/GM ointment Apply 1 application topically 2 (two) times daily. 15 g 0  . budesonide-formoterol (SYMBICORT) 160-4.5 MCG/ACT inhaler Inhale 2 puffs into the lungs 2 (two) times daily.     Marland Kitchen  buPROPion (WELLBUTRIN XL) 150 MG 24 hr tablet Take 1 tablet (150 mg total) by mouth daily. 90 tablet 1  . clonazePAM (KLONOPIN) 0.5 MG tablet Take 0.5 mg by mouth 3 (three) times daily.     . diclofenac sodium (VOLTAREN) 1 % GEL Apply topically 3 (three) times daily.    Marland Kitchen dicyclomine (BENTYL) 10 MG capsule Take 10 mg by mouth 3 (three) times daily.     . ferrous sulfate 325 (65 FE) MG tablet Take 325 mg by mouth 2 (two) times daily.     . furosemide (LASIX) 80 MG tablet Take 80 mg by mouth.    . gabapentin (NEURONTIN) 600 MG tablet Take 1 tablet (600 mg total) by mouth 3 (three) times daily. 90 tablet 2  . hydrochlorothiazide (MICROZIDE) 12.5 MG capsule Take 12.5 mg by mouth daily with supper.    Marland Kitchen HYDROcodone-acetaminophen (NORCO) 7.5-325 MG tablet Take 1 tablet by mouth every 6 (six) hours as needed for moderate pain.    . hydrOXYzine (ATARAX/VISTARIL) 10 MG tablet Take 10 mg by mouth every 8 (eight) hours as needed.    . insulin glargine (LANTUS) 100 UNIT/ML injection Inject 50 Units into the skin every evening.     . insulin lispro (HUMALOG) 100 UNIT/ML injection Inject 8 Units into the skin 3 (three) times daily before meals.     . lactulose (CHRONULAC) 10 GM/15ML solution Take 10 g by mouth daily. (0800)    . lamoTRIgine (LAMICTAL) 100 MG tablet Take 1 tablet (100 mg total) by mouth 2 (two) times daily. (0800 & 2000) 180 tablet 1  . lamoTRIgine (LAMICTAL) 25 MG tablet Take  1 tablet (25 mg total) by mouth 2 (two) times daily. To be added to the Lamictal 100 mg at 8 AM and 8 pm 180 tablet 1  . lidocaine (LIDODERM) 5 % Place 2 patches onto the skin daily. Apply 1 patch to 2 areas on back (2 patches) every morning and remove at bedtime.    . liraglutide (VICTOZA) 18 MG/3ML SOPN Inject 1.8 mg into the skin daily.    Marland Kitchen LYRICA 150 MG capsule Take 150 mg by mouth 3 (three) times daily. (0800, 1400 & 2000)    . metFORMIN (GLUCOPHAGE-XR) 500 MG 24 hr tablet Take 500 mg by mouth twice daily.    . metoCLOPramide (REGLAN) 5 MG tablet Take 5 mg by mouth 4 (four) times daily -  before meals and at bedtime. (0800, 1200, 1600 & 2000)    . MYRBETRIQ 25 MG TB24 tablet     . naloxone (NARCAN) nasal spray 4 mg/0.1 mL Place 1 spray into the nose.    . nystatin (MYCOSTATIN/NYSTOP) powder Apply topically 4 (four) times daily. (Patient taking differently: Apply topically 4 (four) times daily. Apply to inner thigh/ peri area twice daily and apply to abdomen 4 times daily.) 15 g 0  . omeprazole (PRILOSEC) 20 MG capsule Take 20 mg by mouth daily.     Marland Kitchen oxybutynin (DITROPAN-XL) 5 MG 24 hr tablet Take 5 mg by mouth daily. (0800)    . rOPINIRole (REQUIP) 3 MG tablet Take 3 mg by mouth at bedtime. (2000)    . senna (GERI-KOT) 8.6 MG tablet Take 1 tablet by mouth daily.    . simvastatin (ZOCOR) 10 MG tablet Take 10 mg by mouth at bedtime.    Marland Kitchen spironolactone (ALDACTONE) 50 MG tablet Take 50 mg by mouth 2 (two) times daily. (0800)    . trolamine salicylate (ASPERCREME) 10 %  cream Apply 1 application topically 2 (two) times daily as needed for muscle pain.    Marland Kitchen zolpidem (AMBIEN) 10 MG tablet Take 1 tablet (10 mg total) by mouth at bedtime as needed for sleep. 30 tablet 3   No current facility-administered medications for this visit.     Review of Systems  Constitutional: Negative.  Negative for chills, diaphoresis, fever, malaise/fatigue and weight loss (up 8 pounds).       Feels "pretty good".   HENT: Negative.  Negative for congestion, ear pain, nosebleeds, sinus pain and sore throat.   Eyes: Negative.  Negative for blurred vision, double vision, photophobia and pain.  Respiratory: Positive for shortness of breath (exertional). Negative for cough, hemoptysis and sputum production.   Cardiovascular: Negative.  Negative for chest pain, palpitations, orthopnea, leg swelling and PND.  Gastrointestinal: Negative.  Negative for abdominal pain, blood in stool, constipation, diarrhea, melena, nausea and vomiting.  Genitourinary: Negative for dysuria, frequency, hematuria and urgency.  Musculoskeletal: Positive for joint pain (right knee pain). Negative for back pain, falls, myalgias and neck pain.  Skin: Negative.  Negative for itching and rash.  Neurological: Negative.  Negative for dizziness, tremors, sensory change, speech change, focal weakness, weakness and headaches.  Endo/Heme/Allergies: Bruises/bleeds easily.       Diabetes.  Psychiatric/Behavioral: Positive for memory loss (forgetful). Negative for depression. The patient is not nervous/anxious and does not have insomnia.   All other systems reviewed and are negative.  Performance status (ECOG): 2  Physical Exam: Blood pressure 114/71, pulse 71, temperature (!) 96.6 F (35.9 C), temperature source Tympanic, resp. rate 18, weight 216 lb 4.8 oz (98.1 kg). GENERAL:  Well developed, well nourished, woman sitting comfortably in a wheelchair in the exam room in no acute distress. MENTAL STATUS:  Alert and oriented to person, place and time. HEAD:  Short gray hair.  Normocephalic, atraumatic, face symmetric, no Cushingoid features. EYES:  Blue eyes.  Left eye injected.  Pupils equal round and reactive to light and accomodation.  No scleral icterus. ENT:  Oropharynx clear without lesion.  Tongue normal. Mucous membranes moist.  RESPIRATORY:  Clear to auscultation without rales, wheezes or rhonchi. CARDIOVASCULAR:  Regular rate and  rhythm without murmur, rub or gallop. ABDOMEN:  Soft, non-tender, with active bowel sounds, and no hepatosplenomegaly.  No masses. SKIN:  No rashes, ulcers or lesions. EXTREMITIES: No edema, no skin discoloration or tenderness.  No palpable cords. LYMPH NODES: No palpable cervical, supraclavicular, axillary or inguinal adenopathy  NEUROLOGICAL: Unremarkable. PSYCH:  Appropriate.    [facility] Labs: 09/19/2016:  Hematocrit 39.0, hemoglobin 12.6, MCV 94, platelets are 110,000, white count 9000 with an ANC of 7200. Comprehensive metabolic panel included an alkaline phosphatase of 213. Bilirubin, SGOT and SGPT were normal.  Creatinine was normal. Iron studies included a ferritin of 136, iron saturation of 28% and a TIBC of 310. 04/05/2017:  Hematocrit 39.7, hemoglobin 13.3, MCV 96, platelets are 107,000, white count 5700 with an ANC of 4200. Comprehensive metabolic panel included an alkaline phosphatase of 225. Bilirubin, SGOT and SGPT were normal.  Creatinine was 0.78.  Ferritin was  214. PT was 10.9 with an INR of 1.1. 05/19/2017:  Hematocrit of 37.5, hemoglobin 12.4, MCV 96, platelets 117,000, WBC 5500 with an ANC of 3900.  LFTS revealed an albumen of 3.5, bilirubin 0.6, alkaline phosphatase of 219 (39-117), SGOT 25, SPGT 20.  PT was 10.8 (INR 1.0).  AFP was 4.6.  Ferritin was 329. 07/28/2017:  Alkaline phosphatase 176 (improved). 10/26/2017:  Hematocrit 38.0, hemoglobin 13.2, MCV 93, platelets 115,000, white count 6400 with an ANC of 4900.  LFTs revealed a albumin of 4.0, SGOT 27, SGPT 20, alkaline phosphatase 205 and bilirubin 0.6.  PT is 10.7 (INR 1.0).  AFP is 5.1. 02/06/2018:  Sodium 138, potassium 3.4, creatinine 1.25, alkaline phosphatase 142 (39-117), ALT 21, ALT 14, PT 10.9 (INR 1.0).   Hospital Outpatient Visit on 07/11/2018  Component Date Value Ref Range Status  . WBC 07/11/2018 8.2  4.0 - 10.5 K/uL Final  . RBC 07/11/2018 3.68* 3.87 - 5.11 MIL/uL Final  . Hemoglobin 07/11/2018 11.0*  12.0 - 15.0 g/dL Final  . HCT 07/11/2018 34.4* 36.0 - 46.0 % Final  . MCV 07/11/2018 93.5  80.0 - 100.0 fL Final  . MCH 07/11/2018 29.9  26.0 - 34.0 pg Final  . MCHC 07/11/2018 32.0  30.0 - 36.0 g/dL Final  . RDW 07/11/2018 15.9* 11.5 - 15.5 % Final  . Platelets 07/11/2018 91* 150 - 400 K/uL Final   Comment: Immature Platelet Fraction may be clinically indicated, consider ordering this additional test [id number]   . nRBC 07/11/2018 0.0  0.0 - 0.2 % Final  . Neutrophils Relative % 07/11/2018 83  % Final  . Neutro Abs 07/11/2018 6.8  1.7 - 7.7 K/uL Final  . Lymphocytes Relative 07/11/2018 6  % Final  . Lymphs Abs 07/11/2018 0.5* 0.7 - 4.0 K/uL Final  . Monocytes Relative 07/11/2018 10  % Final  . Monocytes Absolute 07/11/2018 0.8  0.1 - 1.0 K/uL Final  . Eosinophils Relative 07/11/2018 1  % Final  . Eosinophils Absolute 07/11/2018 0.1  0.0 - 0.5 K/uL Final  . Basophils Relative 07/11/2018 0  % Final  . Basophils Absolute 07/11/2018 0.0  0.0 - 0.1 K/uL Final  . Immature Granulocytes 07/11/2018 0  % Final  . Abs Immature Granulocytes 07/11/2018 0.02  0.00 - 0.07 K/uL Final   Performed at [facility], [address] [place], [place] [address]    Assessment:  KIMARI LIENHARD is a 68 y.o. 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 Platelets have ranged between 108,000 and 128,000 without trend. She denies any new medications or herbal products.  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  Work-up on 08/29/2014 revealed a positive rheumatoid factor and ANA (anti-double-stranded DNA of 5- equivocal).  Negative studies included hepatitis B surface antibody and antigen, hepatitis C testing, and HIV testing. B12, SPEP, and free light chains were normal.  Additional testing on 10/31/2014 revealed negative hepatitis B core antibody and CMV IgM.  Abdominal ultrasound on 06/27/2014 revealed mild  splenomegaly (14.2 cm) with moderate cirrhosis and no focal liver lesions. RUQ ultrasound on 09/29/2016 revealed a nodular liver (cirrhotic) without focal mass.   Abdominal ultrasound on 05/24/2017 revealed hepatic cirrhosis with mild splenomegaly (15.5 cm).  Abdominal ultrasound on 01/08/2018 revealed stable changes of cirrhosis.  RUQ ultrasound on 07/11/2018 revealed a cirrhotic liver, surgically absent gallbladder, and no other abnormalities.  She is on lactulose 3/day.  Chest, abdomen, and pelvic CT on 08/15/2017 revealed no active pulmonary disease, morphologic appearance of cirrhosis with stable splenomegaly. There were small varices in left upper quadrant.  She has a neural stimulator device.  AFP has been followed: 4 on 04/21/2015, 4.6 on 05/19/2017, and 5.1 on 10/26/2017.  She denies any alcohol use.  She has a history of recurrent iron deficiency anemia.  Ferritin was 20 on 05/11/2015 and 15 on 11/13/2015. Hematocrit  was 29.7 on 11/13/2015.  Last colonoscopy was in 08/2014.  Guaiac cards were negative x 3 in 11/2015.  Diet is good.  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  She is s/p arthroscopic right knee surgery on 11/15/2016.  She is s/p left femur fracture on 03/07/2017.  Alkaline phosphatase is elevated.  She was admitted to [facility] from 07/19/2017 - 07/22/2017 with a group B strept UTI and altered mental status.  Alkaline phosphatase was 143 on 07/19/2017.  Platelet count was 108,000 - 124,000.  She was treated with Augmentin.    Symptomatically, she notes knee pain.  Weight is up 8 pounds.  Exam is stable.  Hemoglobin 11.0 and platelets 91,000 on 07/11/2018.  Plan: 1.   Review labs from 07/11/2018. 2.   Chronic thrombocytopenia  Platelet count 91,000.  Etiology secondary to cirrhosis and splenomegaly. 3.   Cirrhosis  Review RUQ ultrasound from 07/11/2018- no focal liver lesions.  Discuss follow-up with GI (Dr Allen Norris).  Continue AFP and RUQ ultrasound every 6  months.   Schedule abdominal ultrasound 01/09/2019. 4.   Paperwork for the [employer] at [place]. 5.   RTC in 3 months for labs (CBC with diff). 6.   RTC in 6 months for MD assessment, review of [facility] labs (CBC with diff, CMP, AFP and PT/INR), and review of ultrasound.   Honor Loh, NP  07/13/2018,11:19 AM    I saw and evaluated the patient, participating in the key portions of the service and reviewing pertinent diagnostic studies and records.  I reviewed the nurse practitioner's note and agree with the findings and the plan.  The assessment and plan were discussed with the patient.  Several questions were asked by the patient and answered.   Nolon Stalls, MD 07/13/2018,11:19 AM

## 2018-07-17 ENCOUNTER — Encounter (INDEPENDENT_AMBULATORY_CARE_PROVIDER_SITE_OTHER): Payer: Self-pay | Admitting: Nurse Practitioner

## 2018-07-17 DIAGNOSIS — I5032 Chronic diastolic (congestive) heart failure: Secondary | ICD-10-CM | POA: Insufficient documentation

## 2018-07-17 NOTE — Progress Notes (Signed)
Subjective:    Patient ID: Stephanie [name], female    DOB: [birth year], 68 y.o.   MRN: [id number] Chief Complaint  Patient presents with  . Follow-up    HPI  Stephanie [name] is a 68 y.o. female the presents today with complaints of leg pain and swelling.  Since her last visit she has been complaining of more leg pain and swelling.  Since her last visit she is also been relocated to a living facility.  The patient admits that she does not wear her compression stockings as frequently as she should, and she remains dependent a good portion of the day.  She denies any ulceration or cellulitis-like infections.  She denies any fever, chills, nausea, vomiting or diarrhea.  She states that the right leg is little worse than her left.  She underwent a venous reflux study today which revealed no evidence of DVT bilaterally.  No evidence of chronic venous insufficiency bilaterally and no evidence of superficial venous imposes bilaterally.  Past Medical History:  Diagnosis Date  . Adenomatous colon polyp 08/27/2014   Polyps x 3  . Anemia   . Anxiety   . 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 Arthritis   . Asthma   . Bipolar affective disorder ([place])    h/o  . Blood transfusion 2000  . Blood transfusion without reported diagnosis   . Cancer ([place])   . Chronic abdominal pain    abdominal wall pain  . 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 Colon polyp   . COPD (chronic obstructive pulmonary disease) ([place])   . Depression   . Diabetes mellitus without complication ([place])   . Fatty liver 04/09/08   found in abd CT  . GERD (gastroesophageal reflux disease)   . H/O: CVA (cardiovascular accident)    TIA  . H/O: rheumatic fever   . Hepatitis    HEP "B" twenty years ago  . Hyperlipidemia   . Hypertension   . IBS (irritable bowel syndrome)   . Incontinence   . Insomnia   . Lower extremity edema   . Neuropathy    lower legs D/T DM  . Obstructive sleep apnea    Use C-PAP twice per week  .  Platelets decreased ([place])   . RLS (restless legs syndrome)   . Shortness of breath   . Sleep apnea   . Stroke [facility])    TIA- 2002  . Ulcer   . Unspecified disorders of nervous system   . Wears dentures    full upper and lower    Past Surgical History:  Procedure Laterality Date  . ABDOMINAL EXPLORATION SURGERY    . ABDOMINAL HYSTERECTOMY     total  . Axillary artery cannulation via 8-mm Hemashield graft, median sternotomy, extracorporeal circulation with deep hypothermic circulatory arrest, repair of  aortic dessection  01/23/2009   Dr Arlyce Dice  . BACK SURGERY      x 5  . cataract     both eyes  . CHOLECYSTECTOMY    . COLONOSCOPY    . EYE SURGERY Bilateral    Cataract Extraction with IOL  . FACIAL LACERATION REPAIR Left 04/11/2018   Procedure: EARLOBE REPAIR;  Surgeon: Carloyn Manner, MD;  Location: [place];  Service: ENT;  Laterality: Left;  Diabetic - insulin and oral meds sleep apnea  . HEMORROIDECTOMY     and colon polyp removed  . KNEE ARTHROSCOPY WITH LATERAL RELEASE Right 11/15/2016  Procedure: KNEE ARTHROSCOPY WITH LATERAL RELEASE;  Surgeon: Corky Mull, MD;  Location: [facility];  Service: Orthopedics;  Laterality: Right;  . LIVER BIOPSY  04/10/2012   Procedure: LIVER BIOPSY;  Surgeon: Inda Castle, MD;  Location: [facility];  Service: Endoscopy;  Laterality: N/A;  ultrasound to mark order for abd limited/liver to be marked to be sent by [email or web address]   . LUMBAR FUSION  10/09  . LUMBAR WOUND DEBRIDEMENT  05/09/2011   Procedure: LUMBAR WOUND DEBRIDEMENT;  Surgeon: Dahlia Bailiff;  Location: [place];  Service: Orthopedics;  Laterality: N/A;  IRRIGATION AND DEBRIDEMENT SPINAL WOUND  . POSTERIOR CERVICAL FUSION/FORAMINOTOMY    . ROTATOR CUFF REPAIR     left    Social History   Socioeconomic History  . Marital status: Divorced    Spouse name: Not on file  . Number of children: 2  . Years of education: Not on file  . Highest education level: 8th grade   Occupational History  . Occupation: Disabled    Employer: [employer]  Social Needs  . Financial resource strain: Not hard at all  . Food insecurity:    Worry: Never true    Inability: Never true  . Transportation needs:    Medical: No    Non-medical: No  Tobacco Use  . Smoking status: Former Smoker    Packs/day: 1.50    Years: 50.00    Pack years: 75.00    Types: Cigarettes    Last attempt to quit: 04/06/2013    Years since quitting: 5.2  . Smokeless tobacco: Never Used  Substance and Sexual Activity  . Alcohol use: No    Alcohol/week: 0.0 standard drinks  . Drug use: No  . Sexual activity: Never  Lifestyle  . Physical activity:    Days per week: 0 days    Minutes per session: 0 min  . Stress: Very much  Relationships  . Social connections:    Talks on phone: Not on file    Gets together: Not on file    Attends religious service: Never    Active member of club or organization: No    Attends meetings of clubs or organizations: Never    Relationship status: Divorced  . Intimate partner violence:    Fear of current or ex partner: No    Emotionally abused: No    Physically abused: No    Forced sexual activity: No  Other Topics Concern  . Not on file  Social History Narrative   1 son, 81+ y/o   Daily caffeine use: 2 cups daily   Does not get regular exercise   Disability due to bipolar      Has a living will- she is a DNR (she has form at home per pt).    Family History  Problem Relation Age of Onset  . Breast cancer Mother   . Lung cancer Mother   . Stomach cancer Father   . Diabetes Unknown        4 aunts, and 1 uncle  . Depression Brother   . Anxiety disorder Brother   . Colon cancer Neg Hx   . Esophageal cancer Neg Hx   . Rectal cancer Neg Hx     Allergies  Allergen Reactions  . Cortisone     Increased BS  . Doxycycline Hives, Swelling, Other (See Comments) and Nausea Only    Reaction:  Facial swelling  Face red and swollen; no difficulty  breathing     Review  of Systems  Constitutional: Negative.   HENT: Negative.   Eyes: Negative.   Respiratory: Negative.   Cardiovascular: Positive for leg swelling.  Gastrointestinal: Negative.   Endocrine: Negative.   Genitourinary: Negative.   Musculoskeletal: Positive for gait problem and joint swelling.  Skin: Negative.   Allergic/Immunologic: Negative.   Hematological: Negative.   Psychiatric/Behavioral: Negative.         Objective:   Physical Exam Vitals signs reviewed.  Constitutional:      Appearance: Normal appearance. She is normal weight.  HENT:     Head: Normocephalic.     Right Ear: External ear normal.     Left Ear: External ear normal.     Nose: Nose normal.  Eyes:     Extraocular Movements: Extraocular movements intact.     Conjunctiva/sclera: Conjunctivae normal.     Pupils: Pupils are equal, round, and reactive to light.  Cardiovascular:     Rate and Rhythm: Normal rate and regular rhythm.     Pulses:          Posterior tibial pulses are 1+ on the right side and 1+ on the left side.  Pulmonary:     Effort: Pulmonary effort is normal.     Breath sounds: Normal breath sounds.  Abdominal:     General: Abdomen is flat.     Palpations: Abdomen is soft.  Musculoskeletal:     Right lower leg: 2+ Edema present.     Left lower leg: 2+ Edema present.  Skin:    General: Skin is warm and dry.     Capillary Refill: Capillary refill takes 2 to 3 seconds.  Neurological:     General: No focal deficit present.     Mental Status: She is alert.  Psychiatric:        Mood and Affect: Mood normal.     BP 97/62 (BP Location: Right Arm, Patient Position: Sitting, Cuff Size: Normal)   Pulse 84   Resp 16   Ht 5' 3"  (1.6 m)   Wt 218 lb (98.9 kg)   BMI 38.62 kg/m       Assessment & Plan:   1. Lymphedema Spent an extensive amount of time discussing with the patient the causes of lymphedema as well as the treatment as well.  Stressed to the patient that  this is a chronic condition that has to be managed manually, not with the intervention of pills or surgeries.  I stressed the need for her to wear her medical grade 1 compression stockings on a daily basis.  Also that her stockings should be changed every year and that she should strive to wear them 20 to 30 mmHg.  She should place the socks on daily beginning in the morning and taking them off at night.  She should not sleep in the stockings.  Also stressed elevation of her lower extremities when she is not ambulating.  She should also utilize NSAIDs for pain and inflammation which sometimes occurs, as well as strive to exercise on a daily basis if at all possible.  She was also counseled that she should utilize her lymphedema pump on a daily basis.  She should strive for at least an hour during the evening.  However she could also utilize it for an hour throughout the day as needed.  Patient expresses understanding.  2. Hyperlipidemia, unspecified hyperlipidemia type Continue statin as ordered and reviewed, no changes at this time   3. Essential hypertension Continue antihypertensive medications as already ordered,  these medications have been reviewed and there are no changes at this time.   4. Postphlebitic syndrome with inflammation Stressed with the patient that due to the fact that she has had a DVT before she will be more prone to swell.  She may also face little bit more of inflammation which may make things itchy or somewhat painful.  Stressed use of NSAIDs for pain and if she endorses itching a topical agent such as cortisone or Benadryl during the bed time hours.  Also stressed the above information to help control her swelling.   Current Outpatient Medications on File Prior to Visit  Medication Sig Dispense Refill  . albuterol (PROVENTIL HFA;VENTOLIN HFA) 108 (90 Base) MCG/ACT inhaler Inhale 2 puffs into the lungs every 6 (six) hours as needed for wheezing or shortness of breath.    Marland Kitchen  albuterol (PROVENTIL) (2.5 MG/3ML) 0.083% nebulizer solution Take 2.5 mg by nebulization every 6 (six) hours as needed for wheezing or shortness of breath.    . budesonide-formoterol (SYMBICORT) 160-4.5 MCG/ACT inhaler Inhale 2 puffs into the lungs 2 (two) times daily.     Marland Kitchen buPROPion (WELLBUTRIN XL) 150 MG 24 hr tablet Take 1 tablet (150 mg total) by mouth daily. 90 tablet 1  . clonazePAM (KLONOPIN) 0.5 MG tablet Take 0.5 mg by mouth 3 (three) times daily.     . diclofenac sodium (VOLTAREN) 1 % GEL Apply topically 3 (three) times daily.    Marland Kitchen dicyclomine (BENTYL) 10 MG capsule Take 10 mg by mouth 3 (three) times daily.     . ferrous sulfate 325 (65 FE) MG tablet Take 325 mg by mouth 2 (two) times daily.     . furosemide (LASIX) 80 MG tablet Take 80 mg by mouth.    . gabapentin (NEURONTIN) 600 MG tablet Take 1 tablet (600 mg total) by mouth 3 (three) times daily. 90 tablet 2  . hydrochlorothiazide (MICROZIDE) 12.5 MG capsule Take 12.5 mg by mouth daily with supper.    Marland Kitchen HYDROcodone-acetaminophen (NORCO) 7.5-325 MG tablet Take 1 tablet by mouth every 6 (six) hours as needed for moderate pain.    . hydrOXYzine (ATARAX/VISTARIL) 10 MG tablet Take 10 mg by mouth every 8 (eight) hours as needed.    . insulin glargine (LANTUS) 100 UNIT/ML injection Inject 50 Units into the skin every evening.     . insulin lispro (HUMALOG) 100 UNIT/ML injection Inject 8 Units into the skin 3 (three) times daily before meals.     . lactulose (CHRONULAC) 10 GM/15ML solution Take 10 g by mouth daily. (0800)    . lamoTRIgine (LAMICTAL) 100 MG tablet Take 1 tablet (100 mg total) by mouth 2 (two) times daily. (0800 & 2000) 180 tablet 1  . lamoTRIgine (LAMICTAL) 25 MG tablet Take 1 tablet (25 mg total) by mouth 2 (two) times daily. To be added to the Lamictal 100 mg at 8 AM and 8 pm 180 tablet 1  . lidocaine (LIDODERM) 5 % Place 2 patches onto the skin daily. Apply 1 patch to 2 areas on back (2 patches) every morning and remove  at bedtime.    . liraglutide (VICTOZA) 18 MG/3ML SOPN Inject 1.8 mg into the skin daily.    Marland Kitchen LYRICA 150 MG capsule Take 150 mg by mouth 3 (three) times daily. (0800, 1400 & 2000)    . metFORMIN (GLUCOPHAGE-XR) 500 MG 24 hr tablet Take 500 mg by mouth twice daily.    . metoCLOPramide (REGLAN) 5 MG tablet Take 5  mg by mouth 4 (four) times daily -  before meals and at bedtime. (0800, 1200, 1600 & 2000)    . MYRBETRIQ 25 MG TB24 tablet     . naloxone (NARCAN) nasal spray 4 mg/0.1 mL Place 1 spray into the nose.    . nystatin (MYCOSTATIN/NYSTOP) powder Apply topically 4 (four) times daily. (Patient taking differently: Apply topically 4 (four) times daily. Apply to inner thigh/ peri area twice daily and apply to abdomen 4 times daily.) 15 g 0  . omeprazole (PRILOSEC) 20 MG capsule Take 20 mg by mouth daily.     Marland Kitchen oxybutynin (DITROPAN-XL) 5 MG 24 hr tablet Take 5 mg by mouth daily. (0800)    . rOPINIRole (REQUIP) 3 MG tablet Take 3 mg by mouth at bedtime. (2000)    . senna (GERI-KOT) 8.6 MG tablet Take 1 tablet by mouth daily.    . simvastatin (ZOCOR) 10 MG tablet Take 10 mg by mouth at bedtime.    Marland Kitchen spironolactone (ALDACTONE) 50 MG tablet Take 50 mg by mouth 2 (two) times daily. (0800)    . trolamine salicylate (ASPERCREME) 10 % cream Apply 1 application topically 2 (two) times daily as needed for muscle pain.    Marland Kitchen zolpidem (AMBIEN) 10 MG tablet Take 1 tablet (10 mg total) by mouth at bedtime as needed for sleep. 30 tablet 3  . bacitracin 500 UNIT/GM ointment Apply 1 application topically 2 (two) times daily. 15 g 0   No current facility-administered medications on file prior to visit.     There are no Patient Instructions on file for this visit. No follow-ups on file.   Kris Hartmann, NP  This note was completed with Sales executive.  Any errors are purely unintentional.

## 2018-07-19 DIAGNOSIS — E669 Obesity, unspecified: Secondary | ICD-10-CM | POA: Insufficient documentation

## 2018-07-21 IMAGING — CR DG KNEE COMPLETE 4+V*L*
1 series · 6 of 6 positions shown · non-contrast
Comparison: 02/15/2017

CLINICAL DATA: Pt arrived via EMS from [HOSPITAL], post witnessed
roll off of bed to floor, hitting left side of head on table. Pt c/o
pain to left side of forehead and left knee pain. Pt has chronic
left knee pain. No deformity noted to knee

EXAM:
LEFT KNEE - COMPLETE 4+ VIEW

[Series 1: t knee lat left · 0.14mm/px · 6 of 6 slices shown]
[im 1/6]
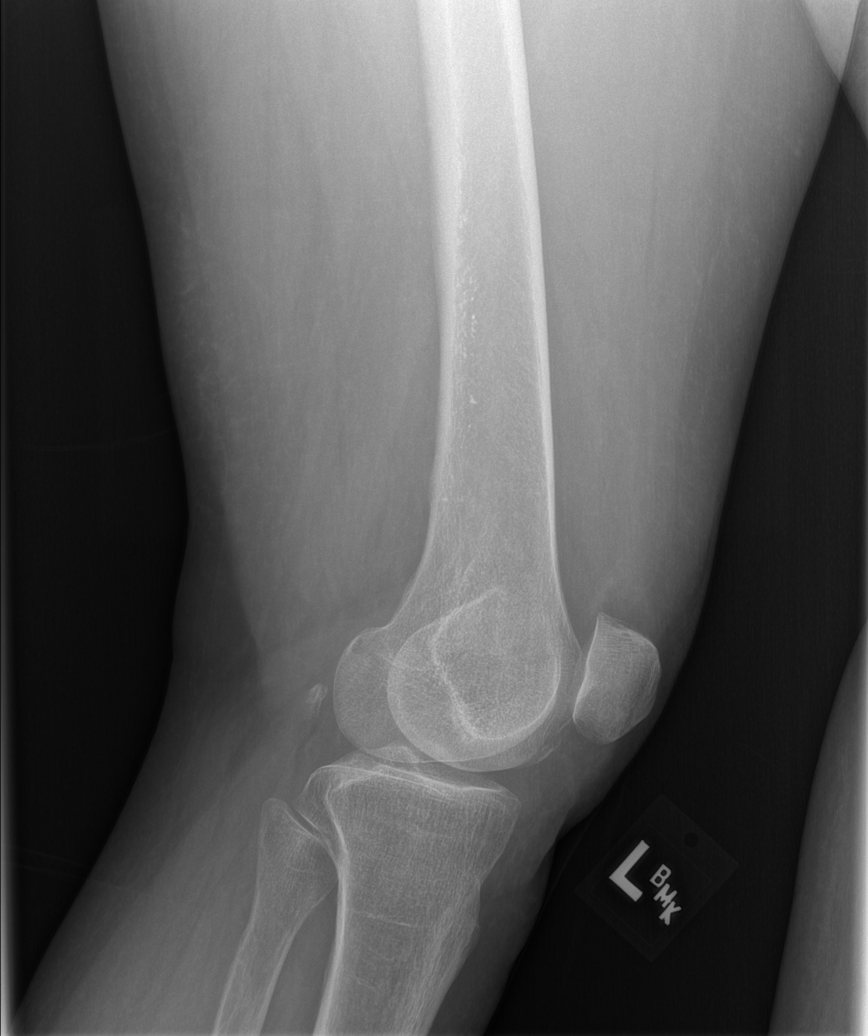
[im 2/6]
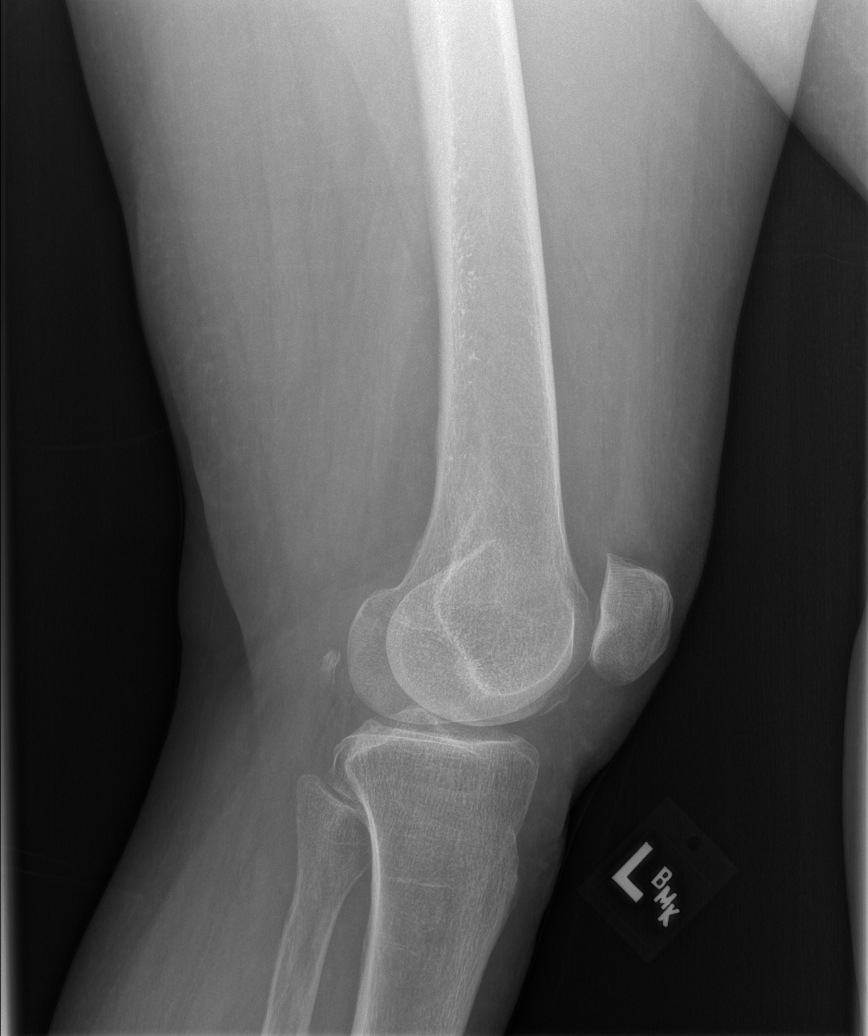
[im 3/6]
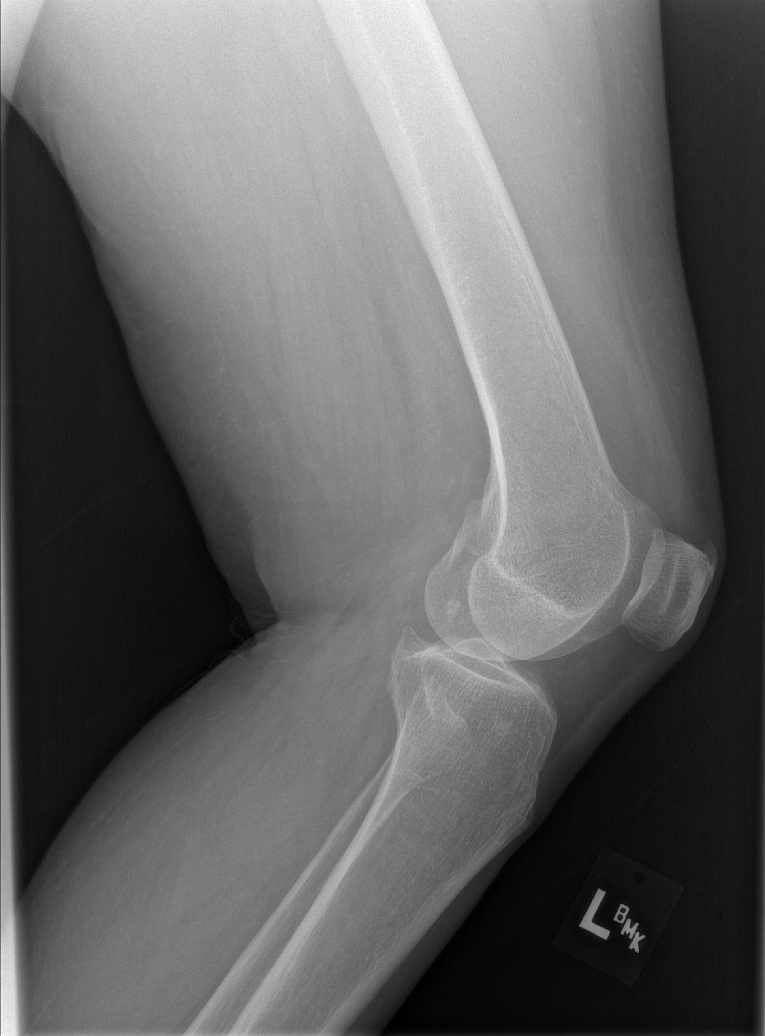
[im 4/6]
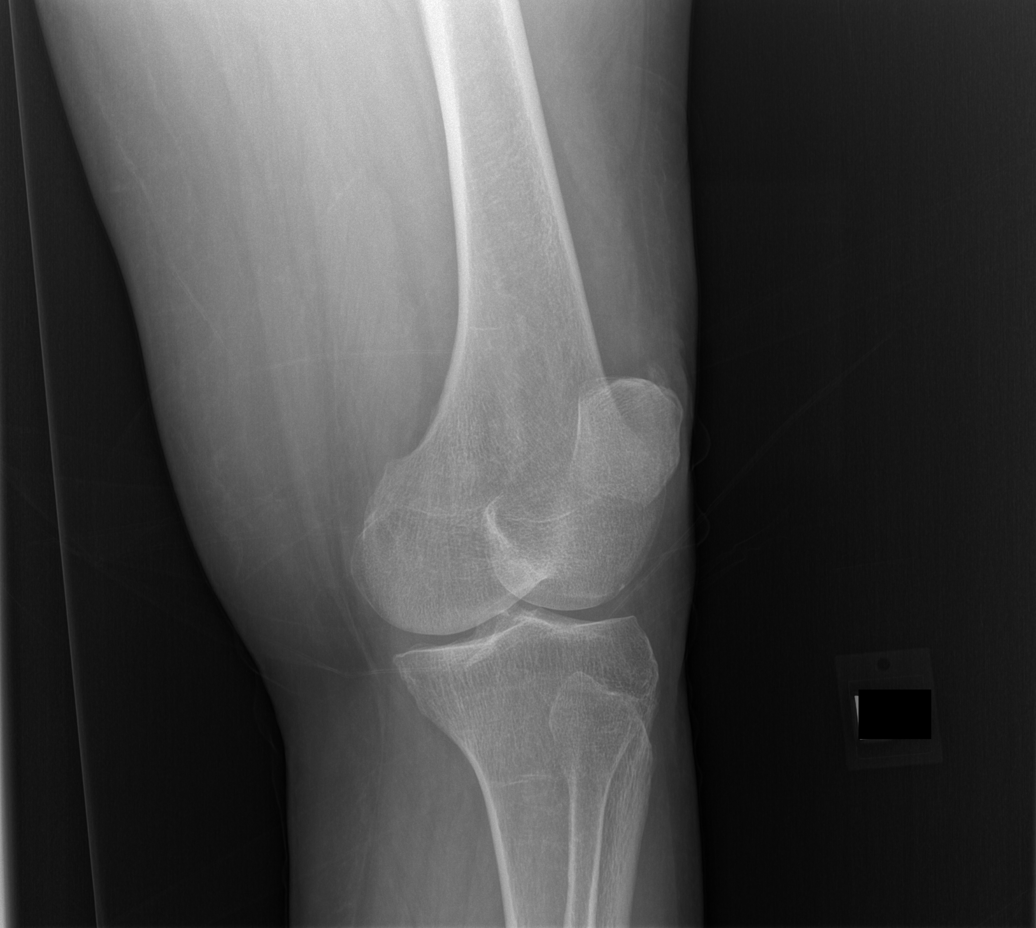
[im 5/6]
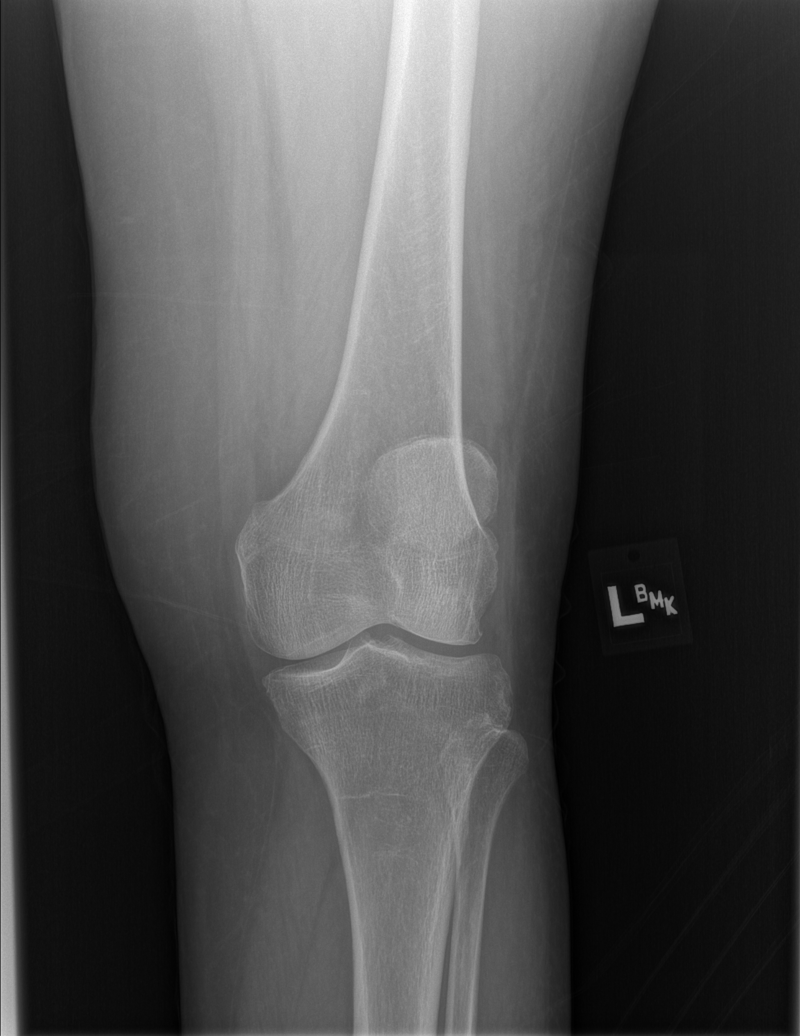
[im 6/6]
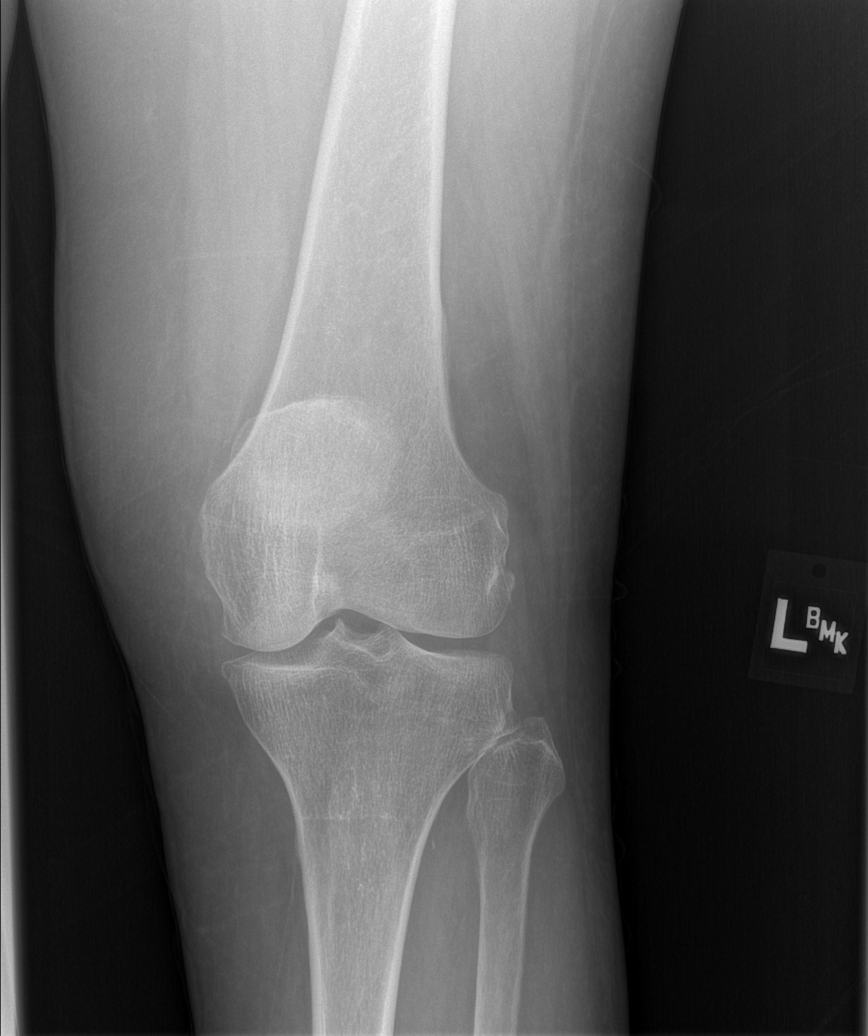

[6 of 6 positions shown; findings below may reference images not displayed]

FINDINGS: There are mild degenerative changes in the patellofemoral
compartment. No acute fracture or subluxation. No joint effusion.
IMPRESSION: No evidence for acute  abnormality.  Mild degenerative changes.

## 2018-07-21 IMAGING — CT CT CERVICAL SPINE W/O CM
4 of 7 series · 15 of 33 positions shown, 16 images · non-contrast
Comparison: 11/05/2017, 07/05/2013 CT exams

CLINICAL DATA: Witnessed fall from bed hitting left side of head on
table. Headache.

EXAM:
CT HEAD WITHOUT CONTRAST
CT CERVICAL SPINE WITHOUT CONTRAST
TECHNIQUE: Multidetector CT imaging of the head and cervical spine was
performed following the standard protocol without intravenous
contrast. Multiplanar CT image reconstructions of the cervical spine
were also generated.

[Series 4: coronal soft tissue · coronal · 0.29mm/px · 3 of 59 slices shown]
[im 15/59  bone]
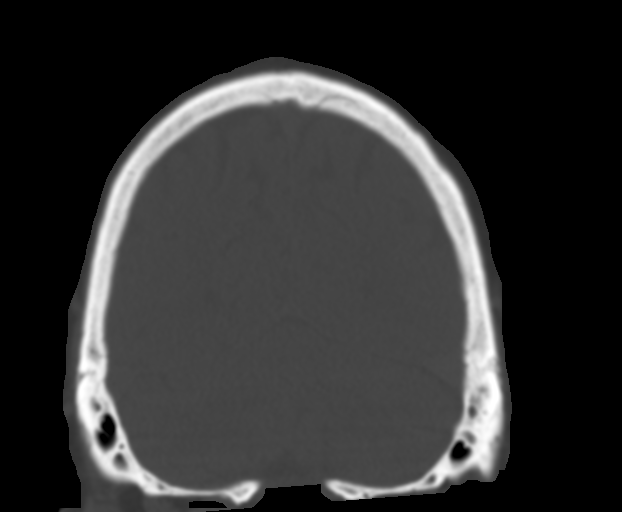
[im 30/59  bone]
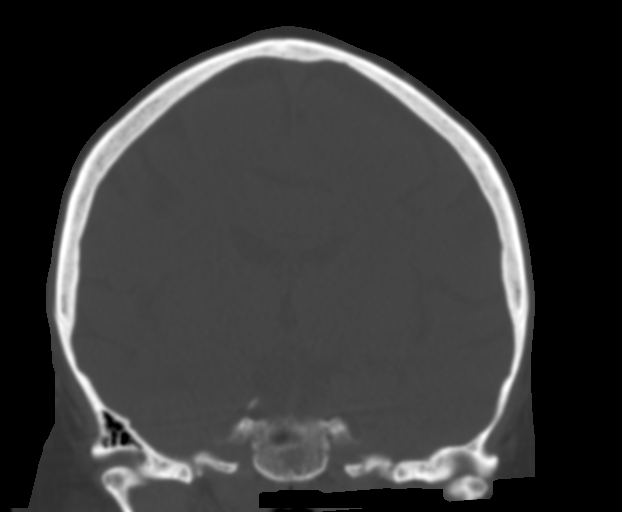
[im 44/59  bone]
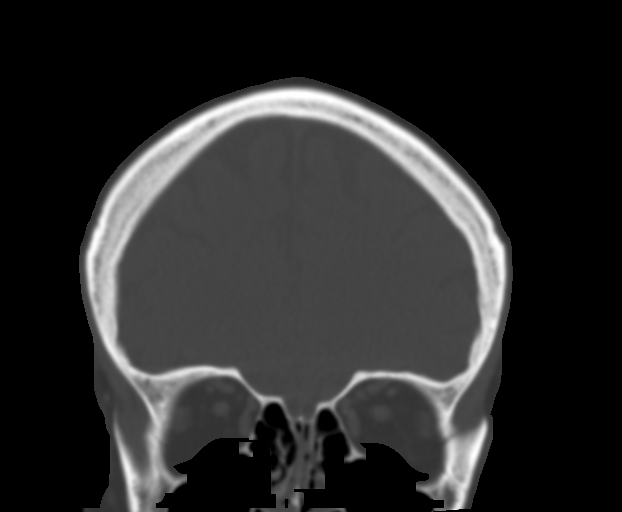

[Series 7: c spine soft · axial · 0.33mm/px · z∈[+168,+236]mm · 3 of 85 slices shown]
[im 17/85  soft-tissue]
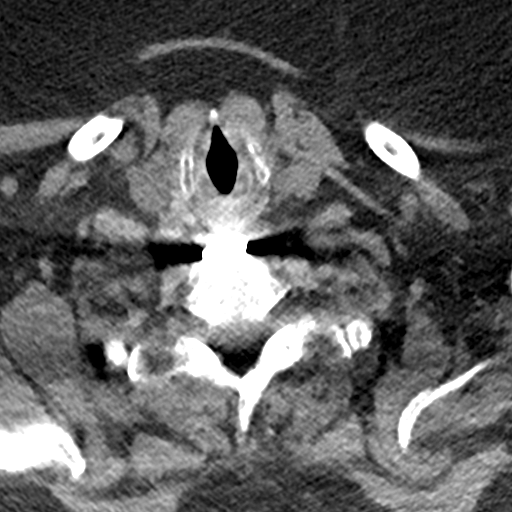
[im 34/85  soft-tissue]
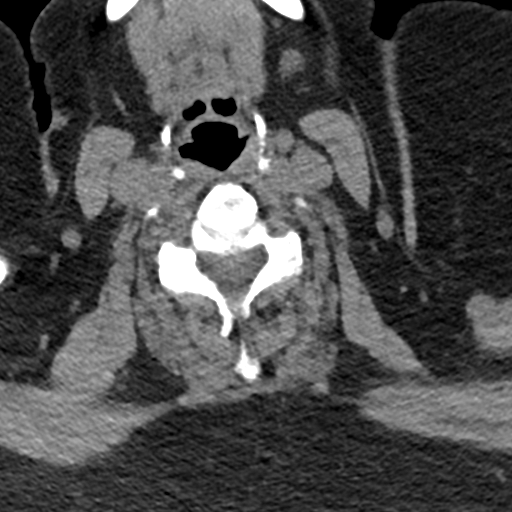
[im 51/85  soft-tissue]
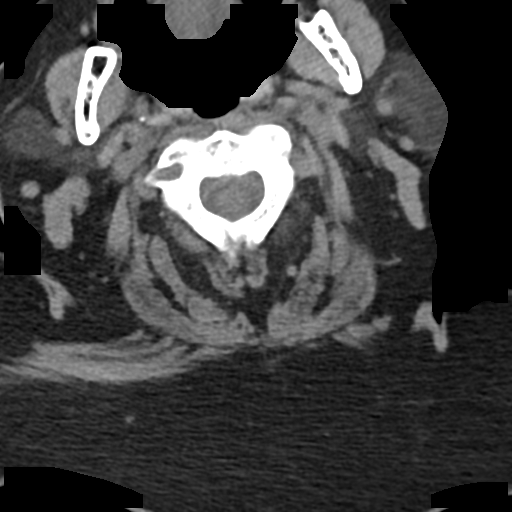

[Series 8: sagittal bone · sagittal · 0.25mm/px · 5 of 60 slices shown]
[im 10/60  bone]
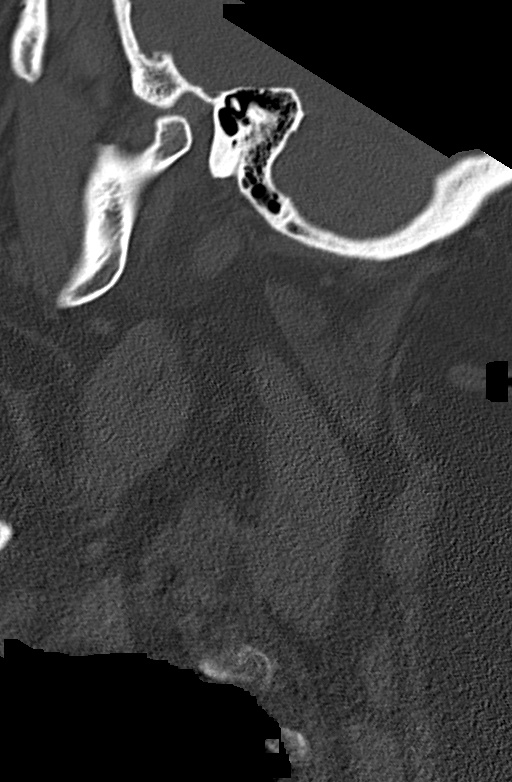
[im 20/60  bone]
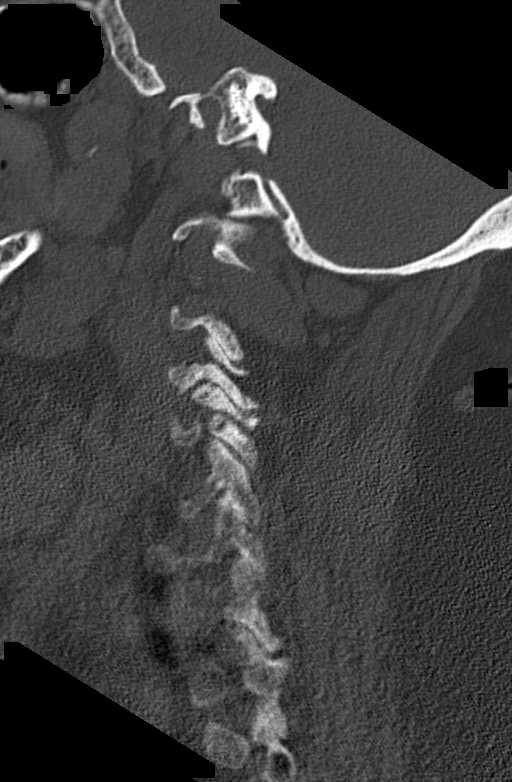
[im 30/60  bone]
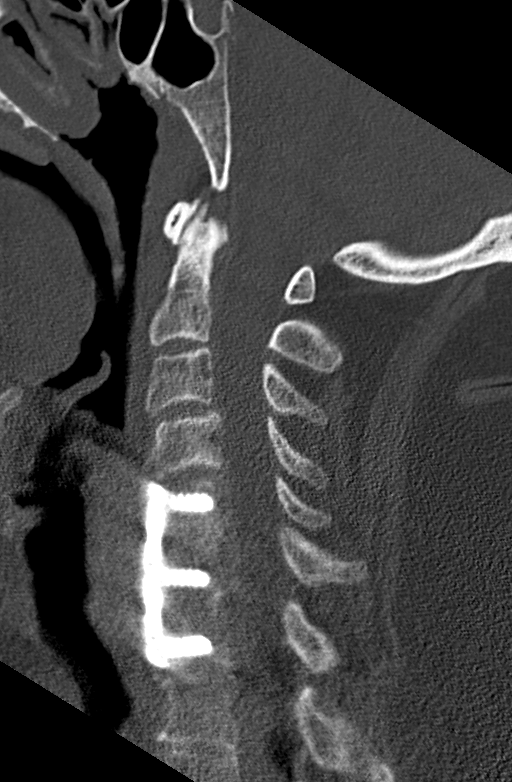
[im 40/60  bone]
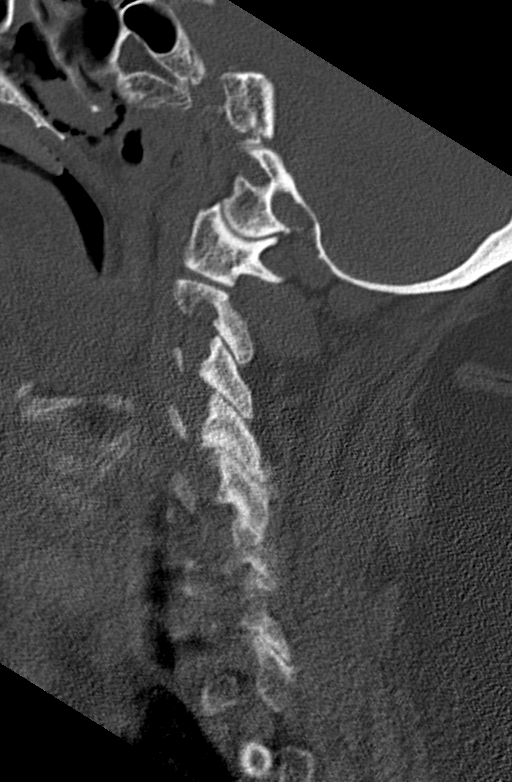
[im 50/60  bone]
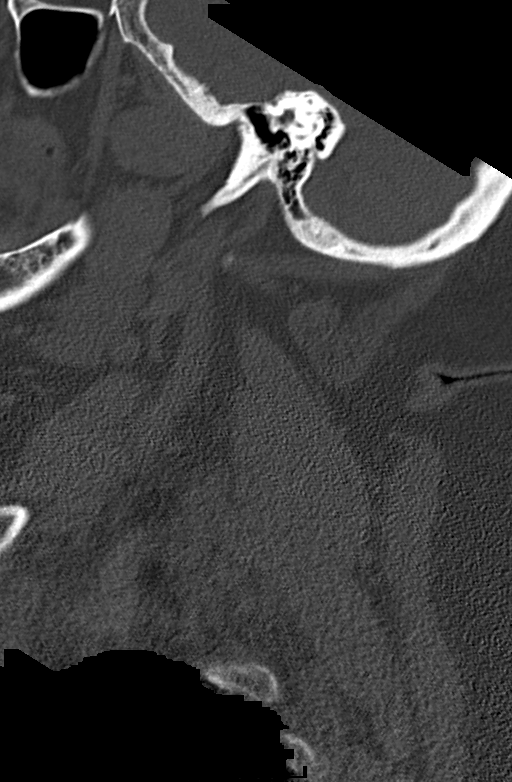

[Series 10: orthogonal bone · axial · 0.23mm/px · z∈[+140,+232]mm · 4 of 94 slices shown, 5 images]
[im 19/94  soft-tissue]
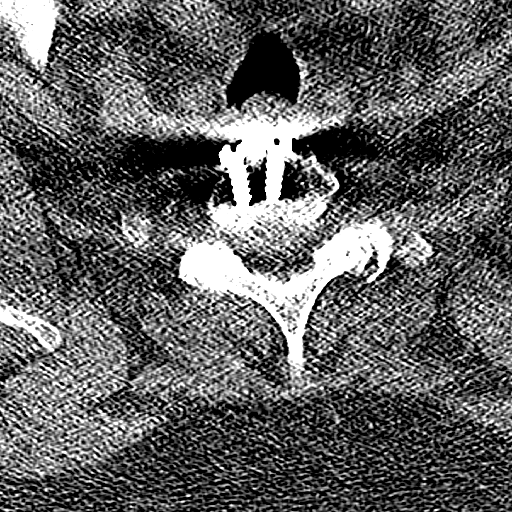
[im 19/94  bone]
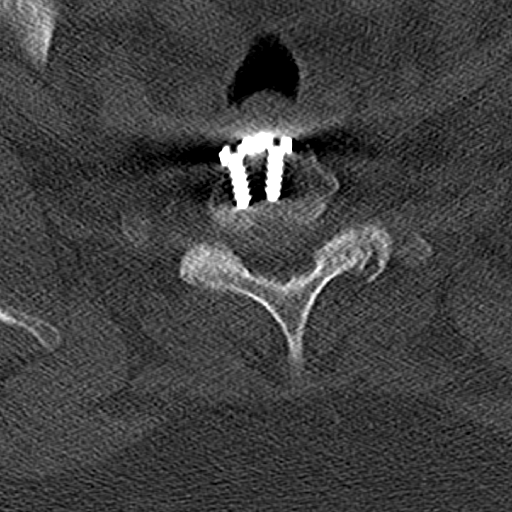
[im 38/94  bone]
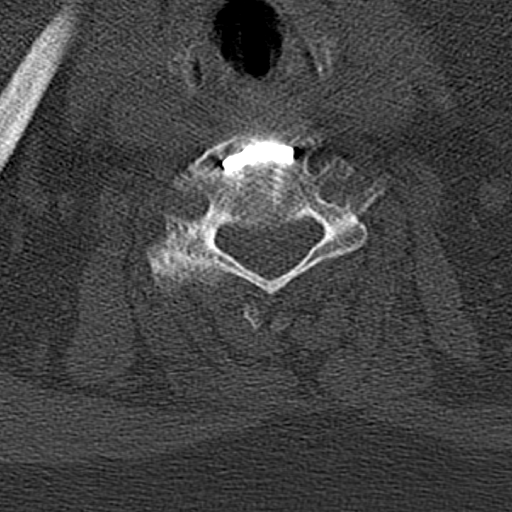
[im 56/94  bone]
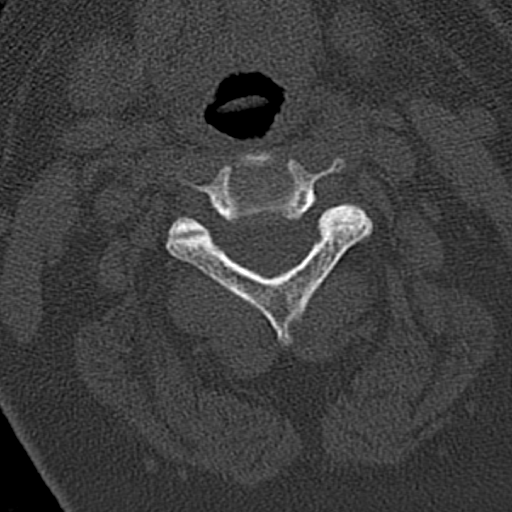
[im 75/94  bone]
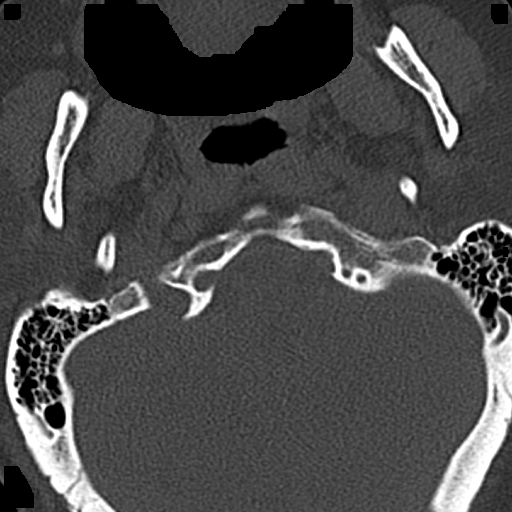

[15 of 33 positions shown; findings below may reference images not displayed]

FINDINGS: CT HEAD FINDINGS

Brain: Age related involutional changes of the brain with chronic
small vessel ischemic disease. No acute intracranial hemorrhage,
midline shift or edema. No large vascular territory infarct.

Vascular: No hyperdense vessel sign. Atherosclerosis of the internal
carotids.

Skull: No skull fracture.

Sinuses/Orbits: Intact

Other: None

CT CERVICAL SPINE FINDINGS

Alignment: Straightening of cervical lordosis likely from patient
positioning or muscle spasm. Intact atlantodental interval and
craniocervical relationship. Slight anterolisthesis of C3 on C4
likely degenerative disc and facet mediated.

Skull base and vertebrae: ACDF of the cervical spine from C5 through
C7. No hardware failure.

Soft tissues and spinal canal: No prevertebral fluid or swelling. No
visible canal hematoma.

Disc levels: No significant central canal or foraminal encroachment.

Upper chest: Stable small subpleural opacity in the posterior right
upper lobe unchanged since neck CT from 0087.

Other: None
IMPRESSION: No acute intracranial abnormality.

No acute cervical spine fracture or listhesis. Intact ACDF from C5
through C7.

## 2018-07-24 ENCOUNTER — Other Ambulatory Visit: Payer: Self-pay | Admitting: Family

## 2018-07-24 DIAGNOSIS — R200 Anesthesia of skin: Secondary | ICD-10-CM | POA: Insufficient documentation

## 2018-07-24 DIAGNOSIS — R413 Other amnesia: Secondary | ICD-10-CM | POA: Insufficient documentation

## 2018-07-24 DIAGNOSIS — R202 Paresthesia of skin: Secondary | ICD-10-CM

## 2018-07-24 DIAGNOSIS — Z1231 Encounter for screening mammogram for malignant neoplasm of breast: Secondary | ICD-10-CM

## 2018-07-26 ENCOUNTER — Telehealth: Payer: Self-pay

## 2018-07-26 NOTE — Telephone Encounter (Signed)
office notes faxed over on patient .

## 2018-07-27 ENCOUNTER — Other Ambulatory Visit: Payer: Medicare Other

## 2018-07-27 ENCOUNTER — Ambulatory Visit
Admission: RE | Admit: 2018-07-27 | Discharge: 2018-07-27 | Disposition: A | Discharge status: Home / Self Care | Payer: Medicare Other | Source: Ambulatory Visit | Attending: Thoracic Surgery (Cardiothoracic Vascular Surgery) | Admitting: Thoracic Surgery (Cardiothoracic Vascular Surgery)

## 2018-07-27 DIAGNOSIS — R918 Other nonspecific abnormal finding of lung field: Secondary | ICD-10-CM

## 2018-07-27 IMAGING — US US ABDOMEN LIMITED
1 series · 14 of 25 positions shown · non-contrast
Comparison: 12/09/2015

CLINICAL DATA: Cirrhosis. Iron deficiency. Thrombocytopenia.
Cholecystectomy.

EXAM:
US ABDOMEN LIMITED - RIGHT UPPER QUADRANT

[Series 1: us abdomen limited · 0.28mm/px · 14 of 30 slices shown]
[im 1/30]
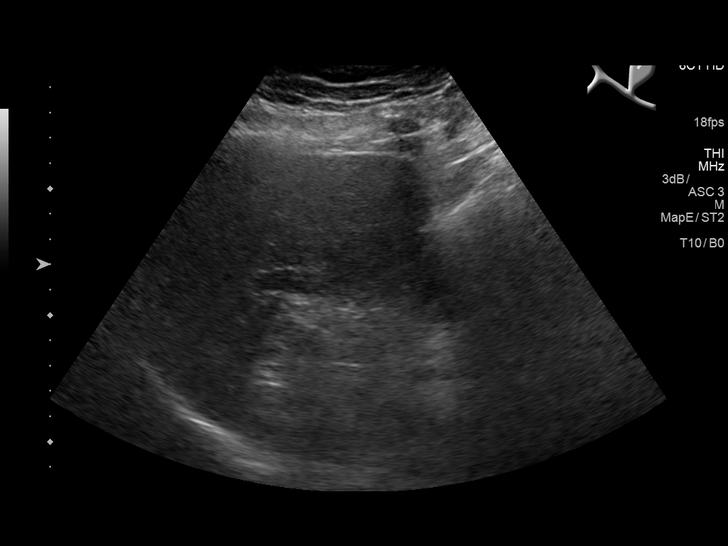
[im 3/30]
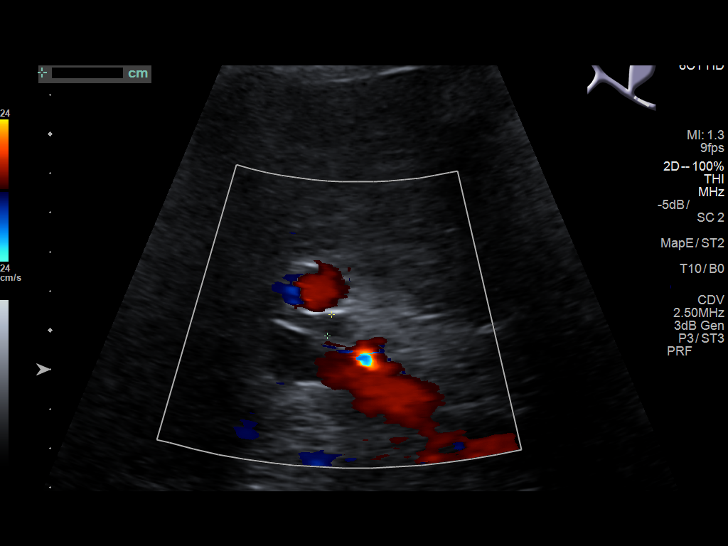
[im 5/30]
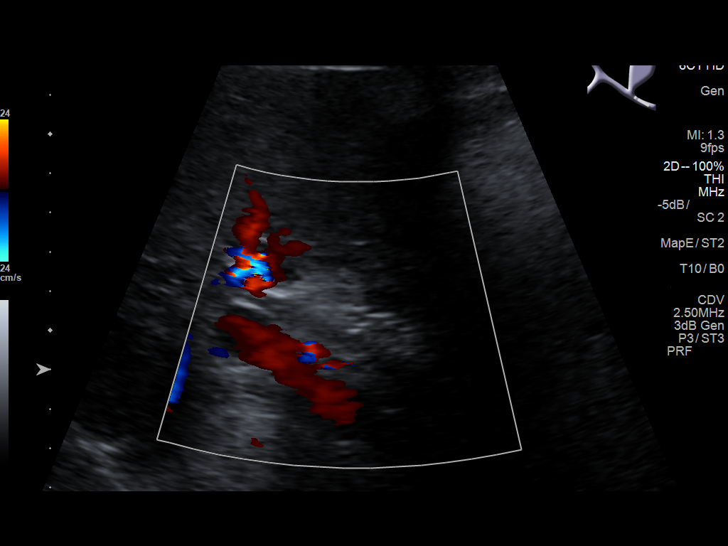
[im 8/30]
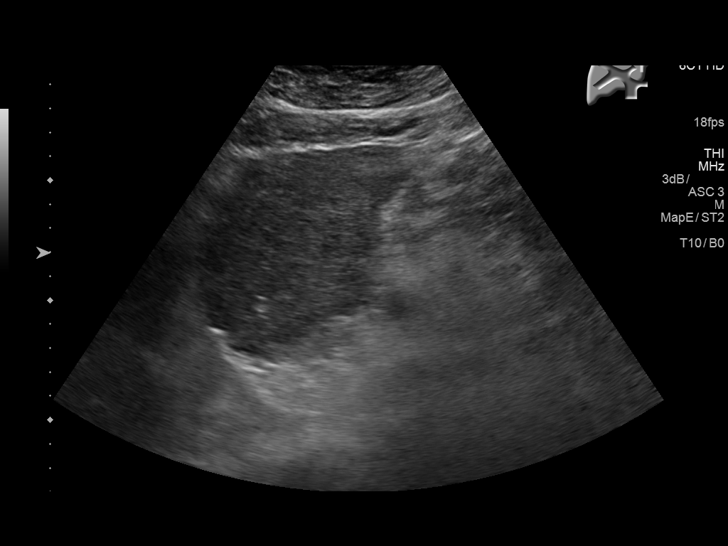
[im 10/30]
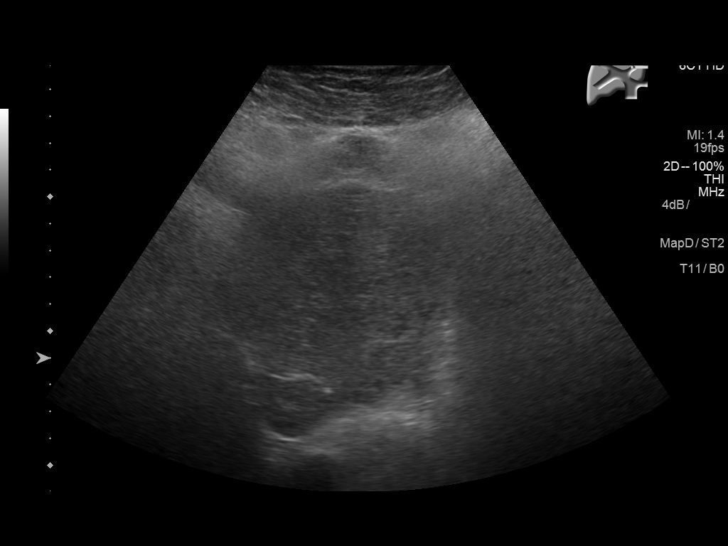
[im 11/30]
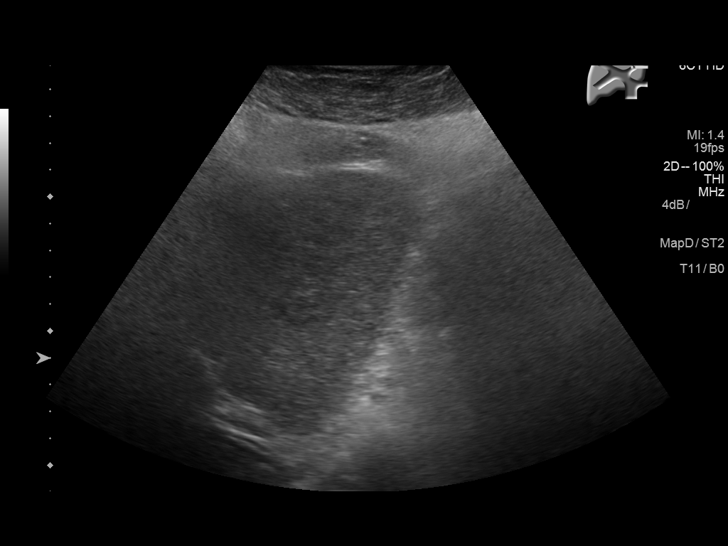
[im 14/30]
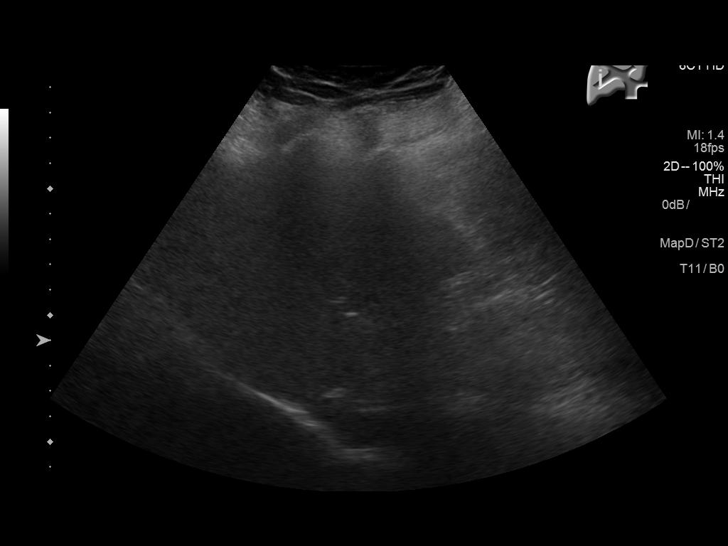
[im 16/30]
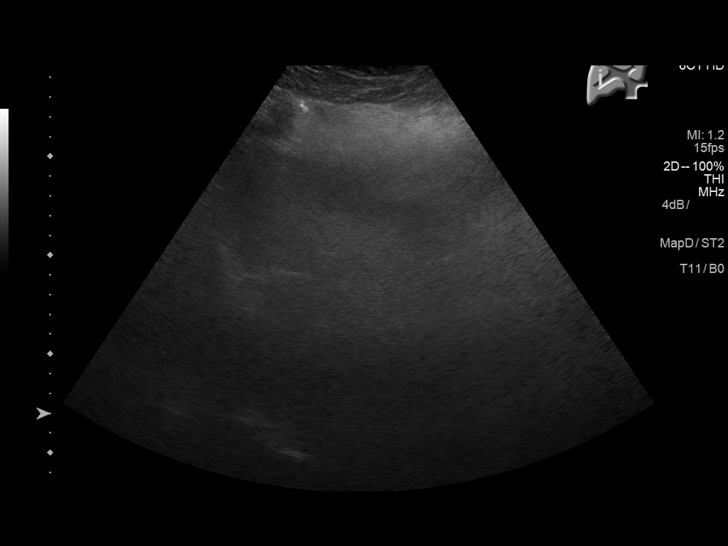
[im 19/30]
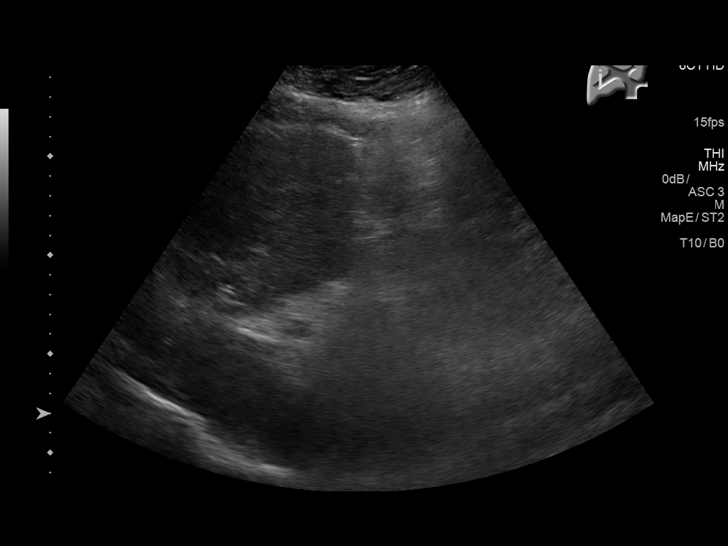
[im 20/30]
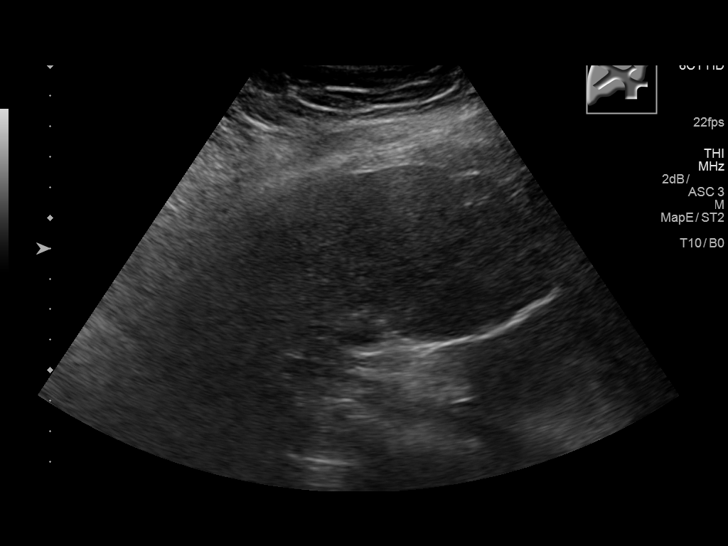
[im 22/30]
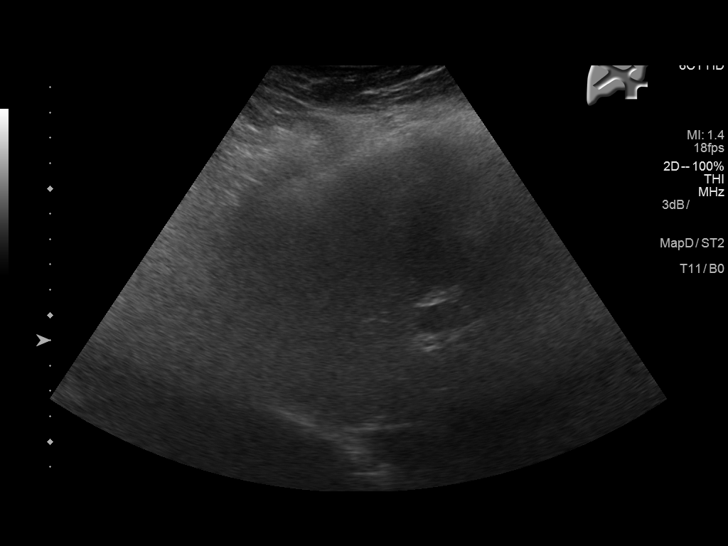
[im 25/30]
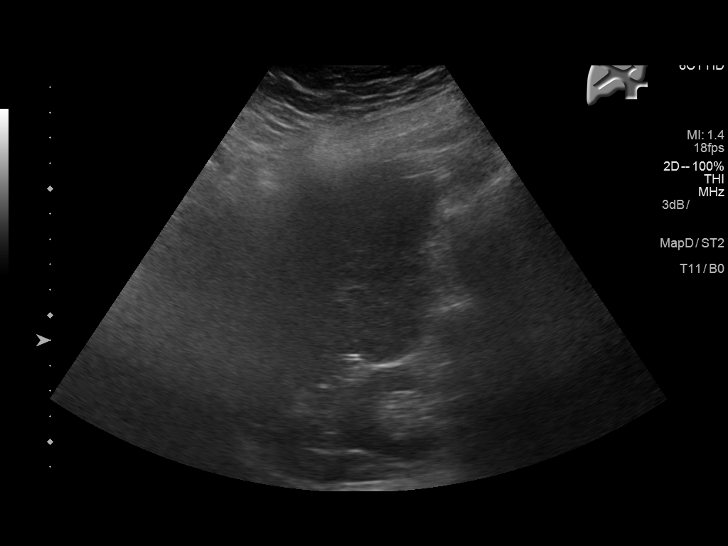
[im 27/30]
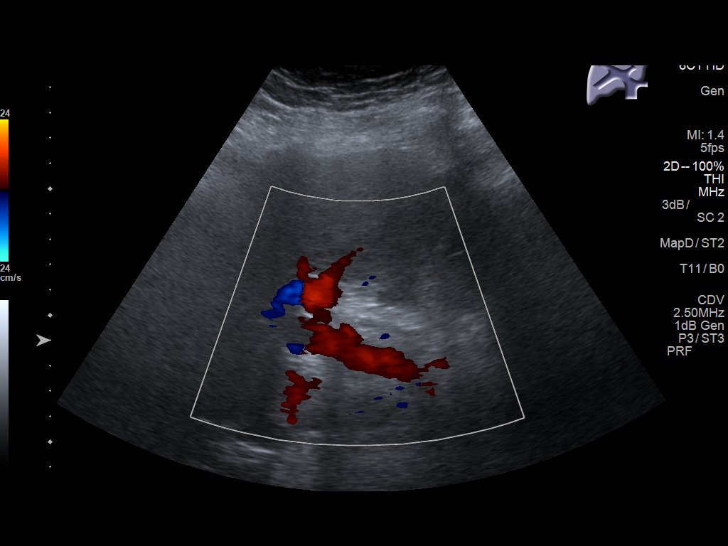
[im 30/30]
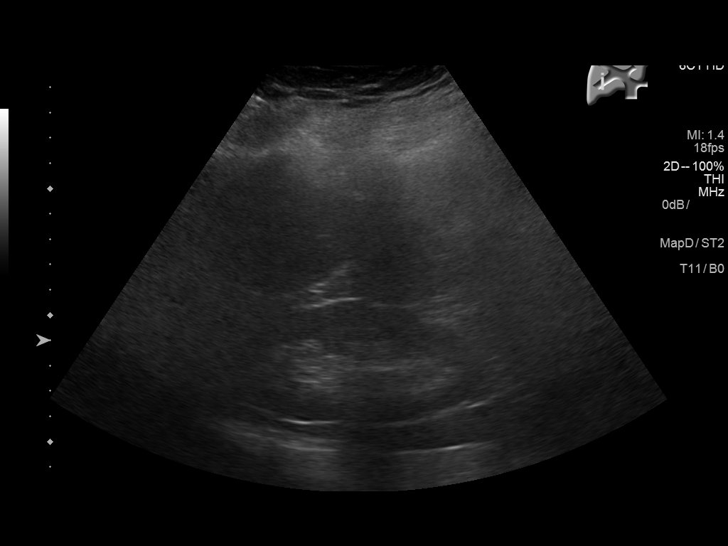

[14 of 25 positions shown; findings below may reference images not displayed]

FINDINGS: Gallbladder:

Absent.

Common bile duct:

Diameter: 5.9 mm

Liver:

The liver has a nodular contour without focal mass.
IMPRESSION: Cirrhotic liver.

Postcholecystectomy.

## 2018-07-31 ENCOUNTER — Ambulatory Visit (INDEPENDENT_AMBULATORY_CARE_PROVIDER_SITE_OTHER): Payer: Medicare Other | Admitting: Thoracic Surgery (Cardiothoracic Vascular Surgery)

## 2018-07-31 ENCOUNTER — Encounter: Payer: Medicare Other | Admitting: Thoracic Surgery (Cardiothoracic Vascular Surgery)

## 2018-07-31 VITALS — BP 112/56 | HR 74 | Resp 20 | Ht 63.0 in | Wt 215.0 lb

## 2018-07-31 DIAGNOSIS — R911 Solitary pulmonary nodule: Secondary | ICD-10-CM | POA: Diagnosis not present

## 2018-07-31 DIAGNOSIS — R918 Other nonspecific abnormal finding of lung field: Secondary | ICD-10-CM

## 2018-07-31 NOTE — Progress Notes (Signed)
[address]       [place] [address]             [phone]    HPI: Ms. Capshaw returns for a one-year follow-up  Mackenzye Mackel is a 68 year old woman with a complex past medical history which includes tobacco abuse, COPD, lung nodules, cirrhosis, ascites, bipolar disorder, chronic pain, type I aortic dissection, stroke, hypertension, hyperlipidemia, thrombocytopenia, splenomegaly, neuropathy, and obstructive sleep apnea.  She was found to have a small right apical lung nodule in 2011.  We followed that for several years but it remained stable.  She developed a groundglass opacity that also remained stable over time.  I last saw her in February 2019.  Her CT showed a new 3 mm right upper lobe nodule.  There was a mosaic attenuation in both lungs.  She quit smoking 5 years ago.  She says her breathing varies from day-to-day but overall is about the same.  She is using inhalers regularly.  She also complains of swelling in her legs which is a chronic problem.  Past Medical History:  Diagnosis Date  . Adenomatous colon polyp 08/27/2014   Polyps x 3  . Anemia   . Anxiety   . 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 Arthritis   . Asthma   . Bipolar affective disorder ([place])    h/o  . Blood transfusion 2000  . Blood transfusion without reported diagnosis   . Cancer ([place])   . Chronic abdominal pain    abdominal wall pain  . 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 Colon polyp   . COPD (chronic obstructive pulmonary disease) ([facility] [place])   . Depression   . Diabetes mellitus without complication ([place])   . Fatty liver 04/09/08   found in abd CT  . GERD (gastroesophageal reflux disease)   . H/O: CVA (cardiovascular accident)    TIA  . H/O: rheumatic fever   . Hepatitis    HEP "B" twenty years ago  . Hyperlipidemia   . Hypertension   . IBS (irritable bowel syndrome)   . Incontinence   . Insomnia   . Lower extremity edema   . Neuropathy    lower legs D/T DM  .  Obstructive sleep apnea    Use C-PAP twice per week  . Platelets decreased ([place])   . RLS (restless legs syndrome)   . Shortness of breath   . Sleep apnea   . Stroke [facility])    TIA- 2002  . Ulcer   . Unspecified disorders of nervous system   . Wears dentures    full upper and lower    Current Outpatient Medications  Medication Sig Dispense Refill  . albuterol (PROVENTIL HFA;VENTOLIN HFA) 108 (90 Base) MCG/ACT inhaler Inhale 2 puffs into the lungs every 6 (six) hours as needed for wheezing or shortness of breath.    Marland Kitchen albuterol (PROVENTIL) (2.5 MG/3ML) 0.083% nebulizer solution Take 2.5 mg by nebulization every 6 (six) hours as needed for wheezing or shortness of breath.    . bacitracin 500 UNIT/GM ointment Apply 1 application topically 2 (two) times daily. 15 g 0  . budesonide-formoterol (SYMBICORT) 160-4.5 MCG/ACT inhaler Inhale 2 puffs into the lungs 2 (two) times daily.     Marland Kitchen buPROPion (WELLBUTRIN XL) 150 MG 24 hr tablet Take 1 tablet (150 mg total) by mouth daily. 90 tablet 1  . clonazePAM (KLONOPIN) 0.5 MG tablet Take 0.5 mg  by mouth 3 (three) times daily.     . diclofenac sodium (VOLTAREN) 1 % GEL Apply topically 3 (three) times daily.    Marland Kitchen dicyclomine (BENTYL) 10 MG capsule Take 10 mg by mouth 3 (three) times daily.     . ferrous sulfate 325 (65 FE) MG tablet Take 325 mg by mouth 2 (two) times daily.     . furosemide (LASIX) 80 MG tablet Take 80 mg by mouth.    . hydrochlorothiazide (MICROZIDE) 12.5 MG capsule Take 12.5 mg by mouth daily with supper.    Marland Kitchen HYDROcodone-acetaminophen (NORCO) 7.5-325 MG tablet Take 1 tablet by mouth every 6 (six) hours as needed for moderate pain.    . hydrOXYzine (ATARAX/VISTARIL) 10 MG tablet Take 10 mg by mouth every 8 (eight) hours as needed.    . insulin glargine (LANTUS) 100 UNIT/ML injection Inject 50 Units into the skin every evening.     . insulin lispro (HUMALOG) 100 UNIT/ML injection Inject 8 Units into the skin 3 (three) times daily before  meals.     . lactulose (CHRONULAC) 10 GM/15ML solution Take 10 g by mouth daily. (0800)    . lamoTRIgine (LAMICTAL) 100 MG tablet Take 1 tablet (100 mg total) by mouth 2 (two) times daily. (0800 & 2000) 180 tablet 1  . lamoTRIgine (LAMICTAL) 25 MG tablet Take 1 tablet (25 mg total) by mouth 2 (two) times daily. To be added to the Lamictal 100 mg at 8 AM and 8 pm 180 tablet 1  . lidocaine (LIDODERM) 5 % Place 2 patches onto the skin daily. Apply 1 patch to 2 areas on back (2 patches) every morning and remove at bedtime.    . liraglutide (VICTOZA) 18 MG/3ML SOPN Inject 1.8 mg into the skin daily.    Marland Kitchen LYRICA 150 MG capsule Take 150 mg by mouth 3 (three) times daily. (0800, 1400 & 2000)    . metFORMIN (GLUCOPHAGE-XR) 500 MG 24 hr tablet Take 500 mg by mouth twice daily.    . metoCLOPramide (REGLAN) 5 MG tablet Take 5 mg by mouth 4 (four) times daily -  before meals and at bedtime. (0800, 1200, 1600 & 2000)    . MYRBETRIQ 25 MG TB24 tablet     . naloxone (NARCAN) nasal spray 4 mg/0.1 mL Place 1 spray into the nose.    . nystatin (MYCOSTATIN/NYSTOP) powder Apply topically 4 (four) times daily. (Patient taking differently: Apply topically 4 (four) times daily. Apply to inner thigh/ peri area twice daily and apply to abdomen 4 times daily.) 15 g 0  . omeprazole (PRILOSEC) 20 MG capsule Take 20 mg by mouth daily.     Marland Kitchen oxybutynin (DITROPAN-XL) 5 MG 24 hr tablet Take 5 mg by mouth daily. (0800)    . rOPINIRole (REQUIP) 3 MG tablet Take 3 mg by mouth at bedtime. (2000)    . senna (GERI-KOT) 8.6 MG tablet Take 1 tablet by mouth daily.    . simvastatin (ZOCOR) 10 MG tablet Take 10 mg by mouth at bedtime.    Marland Kitchen spironolactone (ALDACTONE) 50 MG tablet Take 50 mg by mouth 2 (two) times daily. (0800)    . trolamine salicylate (ASPERCREME) 10 % cream Apply 1 application topically 2 (two) times daily as needed for muscle pain.    Marland Kitchen zolpidem (AMBIEN) 10 MG tablet Take 1 tablet (10 mg total) by mouth at bedtime as  needed for sleep. 30 tablet 3  . gabapentin (NEURONTIN) 600 MG tablet Take 1 tablet (600  mg total) by mouth 3 (three) times daily. 90 tablet 2   No current facility-administered medications for this visit.     Physical Exam BP (!) 112/56   Pulse 74   Resp 20   Ht 5' 3"  (1.6 m)   Wt 215 lb (97.5 kg)   SpO2 95% Comment: RA  BMI 38.09 kg/m  Obese 68 year old woman in no acute distress Appears older than stated age No cervical or supraclavicular adenopathy Cardiac regular rate and rhythm Lungs diminished at bases, no rales or wheezing 1+ edema bilateral lower extremities  Diagnostic Tests: CT CHEST WITHOUT CONTRAST  TECHNIQUE: Multidetector CT imaging of the chest was performed following the standard protocol without IV contrast.  COMPARISON:  Chest CT's 08/15/2017 and 07/07/2017  FINDINGS: Cardiovascular: The heart is mildly enlarged but stable. Mild tortuosity and ectasia of the thoracic aorta with scattered calcifications. Stable three-vessel coronary artery calcifications.  Mediastinum/Nodes: Small scattered mediastinal and hilar lymph nodes are stable. No new or progressive findings. The esophagus is grossly normal.  Lungs/Pleura: Hazy patchy ground-glass opacity suggesting a partial airspace filling process such as edema, interstitial pneumonitis, respiratory bronchiolitis or hypersensitivity pneumonitis. No worrisome pulmonary lesions. No pleural effusions. Dependent bibasilar atelectasis.  The 3 mm nodule noted in the right upper lobe on the prior study from February 2019 is no longer visualized and in retrospect I think this was just a vessel.  Upper Abdomen: Advanced cirrhotic changes involving the liver without focal hepatic lesion. Associated splenomegaly. Upper abdominal lymph nodes, typical with cirrhosis.  Musculoskeletal: No significant bony findings. A spinal cord stimulator is noted along with cervical spine fusion  hardware.  IMPRESSION: 1. Hazy patchy ground-glass opacity in both lungs, likely a partial airspace filling process or interstitial process as detailed above. No focal pneumonia. Pulmonary edema is certainly possible but no pleural effusions. 2. No worrisome pulmonary lesions or nodules. 3. Advanced cirrhotic changes involving the liver without focal lesion. Associated splenomegaly.  Aortic Atherosclerosis (ICD10-I70.0).   Electronically Signed   By: Marijo Sanes M.D.   On: 07/27/2018 11:18 I personally reviewed the CT images and concur with the findings noted above  Impression: Mrs. Yearsley is a 68 year old woman with numerous medical problems.  She does have a past history of tobacco abuse and has had lung nodules in the past.  Some of these have remained stable over time.  Others have waxed and waned.  A year ago she had a new 3 mm nodule in the right upper lobe.  On her CT today there were no suspicious nodules.  She did have diffuse hazy, patchy groundglass appearance in the lungs.  She may have a component of congestive failure she does have some swelling in her legs as well.  She is on a good diuretic regimen with Lasix and spironolactone.  I will defer to her medical doctors whether they want to do any additional work-up of that issue.  She quit smoking 5 years ago but does meet criteria for low-dose lung cancer screening.  She has been followed with CTs regularly going back to 2011.  I will see her back in a year with a low-dose CT of the chest  Plan: Return in 1 year with a low-dose CT chest for lung cancer screening  Melrose Nakayama, MD [facility] [phone]

## 2018-08-03 ENCOUNTER — Ambulatory Visit
Admission: RE | Admit: 2018-08-03 | Discharge: 2018-08-03 | Disposition: A | Discharge status: Home / Self Care | Payer: Medicare Other | Source: Ambulatory Visit | Attending: Family | Admitting: Family

## 2018-08-03 DIAGNOSIS — Z1231 Encounter for screening mammogram for malignant neoplasm of breast: Secondary | ICD-10-CM | POA: Diagnosis present

## 2018-08-07 ENCOUNTER — Telehealth: Payer: Self-pay | Admitting: Nurse Practitioner

## 2018-08-07 NOTE — Telephone Encounter (Signed)
Tia from the [facility] left a voicemail and stated that she has a question about some of the patients medications.

## 2018-08-07 NOTE — Telephone Encounter (Signed)
Attempted to call TIA at the [facility].  She was not available and will call us back.

## 2018-08-09 ENCOUNTER — Ambulatory Visit: Payer: Medicaid Other | Admitting: Psychiatry

## 2018-08-09 ENCOUNTER — Emergency Department
Admission: EM | Admit: 2018-08-09 | Discharge: 2018-08-09 | Disposition: A | Discharge status: Skilled Nursing Facility | Payer: Medicare Other | Attending: Emergency Medicine | Admitting: Emergency Medicine

## 2018-08-09 ENCOUNTER — Encounter: Payer: Self-pay | Admitting: Emergency Medicine

## 2018-08-09 ENCOUNTER — Other Ambulatory Visit: Payer: Self-pay

## 2018-08-09 DIAGNOSIS — Z794 Long term (current) use of insulin: Secondary | ICD-10-CM | POA: Diagnosis not present

## 2018-08-09 DIAGNOSIS — I10 Essential (primary) hypertension: Secondary | ICD-10-CM | POA: Insufficient documentation

## 2018-08-09 DIAGNOSIS — Z79899 Other long term (current) drug therapy: Secondary | ICD-10-CM | POA: Diagnosis not present

## 2018-08-09 DIAGNOSIS — G894 Chronic pain syndrome: Secondary | ICD-10-CM | POA: Diagnosis not present

## 2018-08-09 DIAGNOSIS — Z87891 Personal history of nicotine dependence: Secondary | ICD-10-CM | POA: Insufficient documentation

## 2018-08-09 DIAGNOSIS — E109 Type 1 diabetes mellitus without complications: Secondary | ICD-10-CM | POA: Diagnosis not present

## 2018-08-09 DIAGNOSIS — M791 Myalgia, unspecified site: Secondary | ICD-10-CM | POA: Diagnosis present

## 2018-08-09 DIAGNOSIS — J449 Chronic obstructive pulmonary disease, unspecified: Secondary | ICD-10-CM | POA: Diagnosis not present

## 2018-08-09 DIAGNOSIS — J45909 Unspecified asthma, uncomplicated: Secondary | ICD-10-CM | POA: Diagnosis not present

## 2018-08-09 LAB — COMPREHENSIVE METABOLIC PANEL
ALT: 14 U/L (ref 0–44)
AST: 22 U/L (ref 15–41)
Albumin: 3.7 g/dL (ref 3.5–5.0)
Alkaline Phosphatase: 115 U/L (ref 38–126)
Anion gap: 11 (ref 5–15)
BUN: 20 mg/dL (ref 8–23)
CO2: 26 mmol/L (ref 22–32)
CREATININE: 1.24 mg/dL — AB (ref 0.44–1.00)
Calcium: 9.2 mg/dL (ref 8.9–10.3)
Chloride: 102 mmol/L (ref 98–111)
GFR calc Af Amer: 52 mL/min — ABNORMAL LOW (ref >60)
GFR calc non Af Amer: 45 mL/min — ABNORMAL LOW (ref >60)
Glucose, Bld: 104 mg/dL — ABNORMAL HIGH (ref 70–99)
Potassium: 4 mmol/L (ref 3.5–5.1)
Sodium: 139 mmol/L (ref 135–145)
Total Bilirubin: 0.7 mg/dL (ref 0.3–1.2)
Total Protein: 7 g/dL (ref 6.5–8.1)

## 2018-08-09 LAB — TROPONIN I: Troponin I: <0.03 ng/mL (ref <0.03)

## 2018-08-09 LAB — CBC WITH DIFFERENTIAL/PLATELET
ABS IMMATURE GRANULOCYTES: 0.03 10*3/uL (ref 0.00–0.07)
Basophils Absolute: 0 10*3/uL (ref 0.0–0.1)
Basophils Relative: 1 %
Eosinophils Absolute: 0.1 10*3/uL (ref 0.0–0.5)
Eosinophils Relative: 2 %
HCT: 33.9 % — ABNORMAL LOW (ref 36.0–46.0)
Hemoglobin: 11 g/dL — ABNORMAL LOW (ref 12.0–15.0)
Immature Granulocytes: 1 %
Lymphocytes Relative: 15 %
Lymphs Abs: 1 10*3/uL (ref 0.7–4.0)
MCH: 29.4 pg (ref 26.0–34.0)
MCHC: 32.4 g/dL (ref 30.0–36.0)
MCV: 90.6 fL (ref 80.0–100.0)
MONOS PCT: 11 %
Monocytes Absolute: 0.7 10*3/uL (ref 0.1–1.0)
Neutro Abs: 4.5 10*3/uL (ref 1.7–7.7)
Neutrophils Relative %: 70 %
Platelets: 108 10*3/uL — ABNORMAL LOW (ref 150–400)
RBC: 3.74 MIL/uL — ABNORMAL LOW (ref 3.87–5.11)
RDW: 15.9 % — ABNORMAL HIGH (ref 11.5–15.5)
WBC: 6.3 10*3/uL (ref 4.0–10.5)
nRBC: 0 % (ref 0.0–0.2)

## 2018-08-09 LAB — INFLUENZA PANEL BY PCR (TYPE A & B)
Influenza A By PCR: NEGATIVE
Influenza B By PCR: NEGATIVE

## 2018-08-09 LAB — LIPASE, BLOOD: Lipase: 34 U/L (ref 11–51)

## 2018-08-09 MED ORDER — MIDAZOLAM HCL 2 MG/2ML IJ SOLN: 1.0000 mg | Freq: Once | INTRAMUSCULAR | Status: DC

## 2018-08-09 MED ORDER — MIDAZOLAM HCL 2 MG/2ML IJ SOLN
0.5000 mg | Freq: Once | INTRAMUSCULAR | Status: AC
  Administered 2018-08-09: 0.5 mg via INTRAVENOUS
  Filled 2018-08-09: qty 2

## 2018-08-09 NOTE — ED Provider Notes (Signed)
[facility] Emergency Department Provider Note  ____________________________________________   First MD Initiated Contact with Patient 08/09/18 [phone]     (approximate)  I have reviewed the triage vital signs and the nursing notes.   HISTORY  Chief Complaint Generalized Body Aches  Level 5 caveat: The patient is a poor historian, in a great deal of distress and/or anxiety, and unable to provide much history.  HPI Stephanie [name] is a 68 y.o. female with extensive past medical history as listed below.  She presents by EMS for evaluation of acute onset severe pain.  She is not able to provide much history and is tearful and anxious.  She says that everything from her neck down hurts.  She is not able to characterize it or provide any additional details.  She denies having a fall, just states that she woke up and is in severe pain.  She reports that she has a history of fibromyalgia and the pain feels like that.  She says that she took her regular pain medicine tonight but that it often does not last the full 6 hours before she can get another pill.  She denies shortness of breath but refuses to answer anymore questions because she says that everything hurts.   According to the paramedics there was no history of fall.        Past Medical History:  Diagnosis Date  . Adenomatous colon polyp 08/27/2014   Polyps x 3  . Anemia   . Anxiety   . 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 Arthritis   . Asthma   . Bipolar affective disorder ([place])    h/o  . Blood transfusion 2000  . Blood transfusion without reported diagnosis   . Cancer ([place])   . Chronic abdominal pain    abdominal wall pain  . 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 Colon polyp   . COPD (chronic obstructive pulmonary disease) ([place])   . Depression   . Diabetes mellitus without complication ([place])   . Fatty liver 04/09/08   found in abd CT  . GERD (gastroesophageal reflux disease)   . H/O: CVA  (cardiovascular accident)    TIA  . H/O: rheumatic fever   . Hepatitis    HEP "B" twenty years ago  . Hyperlipidemia   . Hypertension   . IBS (irritable bowel syndrome)   . Incontinence   . Insomnia   . Lower extremity edema   . Neuropathy    lower legs D/T DM  . Obstructive sleep apnea    Use C-PAP twice per week  . Platelets decreased ([place])   . RLS (restless legs syndrome)   . Shortness of breath   . Sleep apnea   . Stroke [facility])    TIA- 2002  . Ulcer   . Unspecified disorders of nervous system   . Wears dentures    full upper and lower    Patient Active Problem List   Diagnosis Date Noted  . Unsteady gait 04/09/2018  . Diabetic peripheral neuropathy associated with type 2 diabetes mellitus ([place]) 04/09/2018  . Laceration without foreign body, right lower leg, initial encounter 04/09/2018  . Chronic obstructive pulmonary disease, unspecified ([place]) 04/09/2018  . Unilateral primary osteoarthritis, right knee 04/09/2018  . Sleep apnea 04/09/2018  . Chronic hip pain, right 04/09/2018  . Closed impacted fracture of hip ([place]) 04/09/2018  . Tricompartment osteoarthritis of right knee 10/18/2017  . Sepsis ([place])  07/20/2017  . Obesity, Class III, BMI 40-49.9 (morbid obesity) ([place]) 05/19/2017  . Cervical stenosis of spine 04/25/2017  . Stress fracture of femur 04/17/2017  . Elevated alkaline phosphatase level 04/16/2017  . Altered mental status 03/07/2017  . Degenerative tear of lateral meniscus of right knee 11/15/2016  . Encephalopathy 07/19/2016  . Degenerative tear of medial meniscus of right knee 07/01/2016  . Primary osteoarthritis of right knee 07/01/2016  . Diabetes mellitus type 1, uncontrolled, without complications ([place]) 28/41/3244  . Lymphedema 06/14/2016  . Pain in limb 05/06/2016  . Swelling of limb 05/06/2016  . Postphlebitic syndrome with inflammation 05/06/2016  . Pneumonia 04/18/2016  . Chest pain 04/04/2016  . Iron deficiency anemia 01/30/2016  .  Bilateral carotid artery stenosis 01/12/2016  . Hypoglycemia 09/09/2015  . Hyperglycemia 07/16/2015  . Neuropathy 06/08/2015  . Thrombocytopenia ([place]) 08/19/2014  . Abdominal pain, chronic, epigastric 06/12/2014  . Liver cirrhosis secondary to NASH ([place]) 02/27/2014  . Dizziness and giddiness 10/30/2013  . DOE (dyspnea on exertion) 10/30/2013  . Poorly controlled type 2 diabetes mellitus with complication ([place]) 06/08/7251  . DNR (do not resuscitate) 07/29/2013  . Encounter for routine gynecological examination 07/29/2013  . Dyspnea 07/10/2013  . Lung nodule 07/10/2013  . Varicose veins of left lower extremity 10/09/2012  . Gastroesophageal reflux disease 10/09/2012  . Lower extremity edema 02/02/2012  . Cirrhosis of liver ([place]) 02/02/2012  . Anxiety disorder 07/11/2011  . Aortic dissection ([place])   . BACK PAIN, LUMBAR, CHRONIC 11/19/2009  . Chronic pain syndrome 11/06/2009  . UNS ADVRS EFF OTH RX MEDICINAL&BIOLOGICAL SBSTNC 12/16/2008  . Irritable bowel syndrome with constipation 10/23/2008  . UNSPECIFIED VITAMIN D DEFICIENCY 09/26/2008  . TOBACCO ABUSE 06/19/2008  . EDEMA, LOCALIZED 01/07/2008  . Restless legs syndrome 04/27/2007  . INSOMNIA 04/27/2007  . Hyperlipidemia 03/08/2007  . OSA (obstructive sleep apnea) 03/08/2007  . Hypertension 03/08/2007  . Asthma 03/08/2007  . Urinary incontinence 03/08/2007  . BIPOLAR AFFECTIVE DISORDER, HX OF 03/08/2007    Past Surgical History:  Procedure Laterality Date  . ABDOMINAL EXPLORATION SURGERY    . ABDOMINAL HYSTERECTOMY     total  . Axillary artery cannulation via 8-mm Hemashield graft, median sternotomy, extracorporeal circulation with deep hypothermic circulatory arrest, repair of  aortic dessection  01/23/2009   Dr Arlyce Dice  . BACK SURGERY      x 5  . cataract     both eyes  . CHOLECYSTECTOMY    . COLONOSCOPY    . EYE SURGERY Bilateral    Cataract Extraction with IOL  . FACIAL LACERATION REPAIR Left 04/11/2018    Procedure: EARLOBE REPAIR;  Surgeon: Carloyn Manner, MD;  Location: [facility];  Service: ENT;  Laterality: Left;  Diabetic - insulin and oral meds sleep apnea  . HEMORROIDECTOMY     and colon polyp removed  . KNEE ARTHROSCOPY WITH LATERAL RELEASE Right 11/15/2016   Procedure: KNEE ARTHROSCOPY WITH LATERAL RELEASE;  Surgeon: Corky Mull, MD;  Location: [facility];  Service: Orthopedics;  Laterality: Right;  . LIVER BIOPSY  04/10/2012   Procedure: LIVER BIOPSY;  Surgeon: Inda Castle, MD;  Location: [facility];  Service: Endoscopy;  Laterality: N/A;  ultrasound to mark order for abd limited/liver to be marked to be sent by [email or web address]   . LUMBAR FUSION  10/09  . LUMBAR WOUND DEBRIDEMENT  05/09/2011   Procedure: LUMBAR WOUND DEBRIDEMENT;  Surgeon: Dahlia Bailiff;  Location: [facility];  Service: Orthopedics;  Laterality: N/A;  IRRIGATION AND  DEBRIDEMENT SPINAL WOUND  . POSTERIOR CERVICAL FUSION/FORAMINOTOMY    . ROTATOR CUFF REPAIR     left    Prior to Admission medications   Medication Sig Start Date End Date Taking? Authorizing Provider  albuterol (PROVENTIL HFA;VENTOLIN HFA) 108 (90 Base) MCG/ACT inhaler Inhale 2 puffs into the lungs every 6 (six) hours as needed for wheezing or shortness of breath.    [provider]  albuterol (PROVENTIL) (2.5 MG/3ML) 0.083% nebulizer solution Take 2.5 mg by nebulization every 6 (six) hours as needed for wheezing or shortness of breath.    [provider]  bacitracin 500 UNIT/GM ointment Apply 1 application topically 2 (two) times daily. 04/11/18   Carloyn Manner, MD  budesonide-formoterol (SYMBICORT) 160-4.5 MCG/ACT inhaler Inhale 2 puffs into the lungs 2 (two) times daily.     [provider]  buPROPion (WELLBUTRIN XL) 150 MG 24 hr tablet Take 1 tablet (150 mg total) by mouth daily. 01/18/18   Ursula Alert, MD  clonazePAM (KLONOPIN) 0.5 MG tablet Take 0.5 mg by mouth 3 (three) times daily.     [provider]  diclofenac sodium (VOLTAREN) 1 % GEL Apply topically 3 (three) times daily.    [provider]  dicyclomine (BENTYL) 10 MG capsule Take 10 mg by mouth 3 (three) times daily.     [provider]  ferrous sulfate 325 (65 FE) MG tablet Take 325 mg by mouth 2 (two) times daily.     [provider]  furosemide (LASIX) 80 MG tablet Take 80 mg by mouth.    [provider]  gabapentin (NEURONTIN) 600 MG tablet Take 1 tablet (600 mg total) by mouth 3 (three) times daily. 04/09/18 07/13/18  Vevelyn Francois, NP  hydrochlorothiazide (MICROZIDE) 12.5 MG capsule Take 12.5 mg by mouth daily with supper.    [provider]  HYDROcodone-acetaminophen (NORCO) 7.5-325 MG tablet Take 1 tablet by mouth every 6 (six) hours as needed for moderate pain.    [provider]  hydrOXYzine (ATARAX/VISTARIL) 10 MG tablet Take 10 mg by mouth every 8 (eight) hours as needed.    [provider]  insulin glargine (LANTUS) 100 UNIT/ML injection Inject 50 Units into the skin every evening.     [provider]  insulin lispro (HUMALOG) 100 UNIT/ML injection Inject 8 Units into the skin 3 (three) times daily before meals.     [provider]  lactulose (CHRONULAC) 10 GM/15ML solution Take 10 g by mouth daily. (0800) 10/17/16   [provider]  lamoTRIgine (LAMICTAL) 100 MG tablet Take 1 tablet (100 mg total) by mouth 2 (two) times daily. (0800 & 2000) 01/18/18   Ursula Alert, MD  lamoTRIgine (LAMICTAL) 25 MG tablet Take 1 tablet (25 mg total) by mouth 2 (two) times daily. To be added to the Lamictal 100 mg at 8 AM and 8 pm 01/18/18   Eappen, Ria Clock, MD  lidocaine (LIDODERM) 5 % Place 2 patches onto the skin daily. Apply 1 patch to 2 areas on back (2 patches) every morning and remove at bedtime.    [provider]  liraglutide (VICTOZA) 18 MG/3ML SOPN Inject 1.8 mg into the skin daily.    [provider]  LYRICA 150 MG  capsule Take 150 mg by mouth 3 (three) times daily. (0800, 1400 & 2000) 11/29/16   [provider]  metFORMIN (GLUCOPHAGE-XR) 500 MG 24 hr tablet Take 500 mg by mouth twice daily. 09/04/17   [provider]  metoCLOPramide (REGLAN) 5 MG tablet Take 5 mg by mouth 4 (four) times daily -  before meals and at bedtime. (0800, 1200, 1600 & 2000)    [provider]  MYRBETRIQ 25 MG TB24 tablet  01/03/18   [provider]  naloxone (NARCAN) nasal spray 4 mg/0.1 mL Place 1 spray into the nose.    [provider]  nystatin (MYCOSTATIN/NYSTOP) powder Apply topically 4 (four) times daily. Patient taking differently: Apply topically 4 (four) times daily. Apply to inner thigh/ peri area twice daily and apply to abdomen 4 times daily. 08/07/17   Lequita Asal, MD  omeprazole (PRILOSEC) 20 MG capsule Take 20 mg by mouth daily.     [provider]  oxybutynin (DITROPAN-XL) 5 MG 24 hr tablet Take 5 mg by mouth daily. (0800)    [provider]  rOPINIRole (REQUIP) 3 MG tablet Take 3 mg by mouth at bedtime. (2000)    [provider]  senna (GERI-KOT) 8.6 MG tablet Take 1 tablet by mouth daily.    [provider]  simvastatin (ZOCOR) 10 MG tablet Take 10 mg by mouth at bedtime.    [provider]  spironolactone (ALDACTONE) 50 MG tablet Take 50 mg by mouth 2 (two) times daily. (0800)    [provider]  trolamine salicylate (ASPERCREME) 10 % cream Apply 1 application topically 2 (two) times daily as needed for muscle pain.    [provider]  zolpidem (AMBIEN) 10 MG tablet Take 1 tablet (10 mg total) by mouth at bedtime as needed for sleep. 05/10/18   Ursula Alert, MD    Allergies Cortisone and Doxycycline  Family History  Problem Relation Age of Onset  . Breast cancer Mother   . Lung cancer Mother   . Stomach cancer Father   . Diabetes Other        4 aunts, and 1 uncle  . Depression Brother   .  Anxiety disorder Brother   . Colon cancer Neg Hx   . Esophageal cancer Neg Hx   . Rectal cancer Neg Hx     Social History Social History   Tobacco Use  . Smoking status: Former Smoker    Packs/day: 1.50    Years: 50.00    Pack years: 75.00    Types: Cigarettes    Last attempt to quit: 04/06/2013    Years since quitting: 5.3  . Smokeless tobacco: Never Used  Substance Use Topics  . Alcohol use: No    Alcohol/week: 0.0 standard drinks  . Drug use: No    Review of Systems Level 5 caveat: The patient is a poor historian, in a great deal of distress and/or anxiety, and unable to provide much history.  She reports pain "everywhere below my neck".  ____________________________________________   PHYSICAL EXAM:  VITAL SIGNS: ED Triage Vitals  Enc Vitals Group     BP 08/09/18 0327 132/71     Pulse Rate 08/09/18 0327 74     Resp 08/09/18 0327 (!) 34     Temp 08/09/18 0327 97.9 F (36.6 C)     Temp Source 08/09/18 0327 Oral     SpO2 08/09/18 0327 98 %     Weight 08/09/18 0329 97 kg (213 lb 13.5 oz)     Height 08/09/18 0329 1.6 m (5' 3" )     Head Circumference --      Peak Flow --      Pain Score 08/09/18 0328 10  Pain Loc --      Pain Edu? --      Excl. in [place]? --     Constitutional: Awake and alert; crying, upset, and moaning; reports that everything hurts. Eyes: Conjunctivae are normal. PERRL. EOMI. Head: Atraumatic. Nose: No congestion/rhinnorhea. Mouth/Throat: Mucous membranes are moist. Neck: No stridor.  No meningeal signs.  No cervical spine tenderness to palpation. Cardiovascular: Normal rate, regular rhythm. Good peripheral circulation. Grossly normal heart sounds. Respiratory: Normal respiratory effort.  No retractions. Lungs CTAB. Gastrointestinal: Soft and nondistended with mild diffuse tenderness throughout but no focal tenderness or evidence of peritonitis. Musculoskeletal: No lower extremity tenderness nor edema. No gross deformities of  extremities. Neurologic: The patient will not cooperate with a neurological exam but she is moving all 4 extremities and has no evidence of facial droop or other acute neurological abnormality. Skin:  Skin is warm, dry and intact. No rash noted.  Old bruising is present on her extremities but there is no evidence of acute injury. Psychiatric: Mood and affect are anxious, upset, and tearful.  ____________________________________________   LABS (all labs ordered are listed, but only abnormal results are displayed)  Labs Reviewed  CBC WITH DIFFERENTIAL/PLATELET - Abnormal; Notable for the following components:      Result Value   RBC 3.74 (*)    Hemoglobin 11.0 (*)    HCT 33.9 (*)    RDW 15.9 (*)    Platelets 108 (*)    All other components within normal limits  COMPREHENSIVE METABOLIC PANEL - Abnormal; Notable for the following components:   Glucose, Bld 104 (*)    Creatinine, Ser 1.24 (*)    GFR calc non Af Amer 45 (*)    GFR calc Af Amer 52 (*)    All other components within normal limits  TROPONIN I  LIPASE, BLOOD  INFLUENZA PANEL BY PCR (TYPE A & B)  URINALYSIS, ROUTINE W REFLEX MICROSCOPIC   ____________________________________________  EKG  ED ECG REPORT I, Hinda Kehr, the attending physician, personally viewed and interpreted this ECG.  Date: 08/09/2018 EKG Time: 3:25 AM Rate: 91 Rhythm: normal sinus rhythm QRS Axis: normal Intervals: normal ST/T Wave abnormalities: Non-specific ST segment / T-wave changes, but no clear evidence of acute ischemia. Narrative Interpretation: no definitive evidence of acute ischemia; does not meet STEMI criteria.   ____________________________________________  RADIOLOGY I, Hinda Kehr, personally viewed and evaluated these images (plain radiographs) as part of my medical decision making, as well as reviewing the written report by the radiologist.  ED MD interpretation: No indication for imaging  Official radiology  report(s): No results found.  ____________________________________________   PROCEDURES   Procedure(s) performed (including Critical Care):  Procedures   ____________________________________________   INITIAL IMPRESSION / MDM / ASSESSMENT AND PLAN / ED COURSE  As part of my medical decision making, I reviewed the following data within the [facility] notes reviewed and incorporated, Labs reviewed , EKG interpreted , Old chart reviewed, Notes from prior ED visits and [place] Controlled Substance Database         Differential diagnosis includes, but is not limited to, chronic pain flare, fibromyalgia, acute infection such as a viral infection causing myalgias, less likely ACS, aortic dissection, pneumonia, pulmonary embolism.  She does not seem to be have any difficulty breathing and her physical exam is generally reassuring.  I reviewed her medical record and she does have a history of aortic dissection but I cannot find any reports of this in recent  notes including a recent visit to vascular surgery.  Primarily her issues seem to be with poor peripheral circulation in her legs.  She also sees a pain management doctor, Dr. Gillis Santa, and his note indicates that pain management is a challenge for her and that frequently her subjective report of her pain scale does not correlate with clinical findings that would suggest a much lower number.  She is on oxycodone 7.5 mg every 6 hours which is apparently the medication that she reports does not last the full 6 hours.  I suspect she is having a flare of her chronic pain and that she awoke feeling uncomfortable.  I cannot give much additional history so I will check some generalized lab work.  I also state that the patient takes Ativan 3 times a day and she is extremely anxious and upset currently.  Although I do so cautiously, I am ordering Versed 0.5 mg IV for its fast onset and relatively short half-life as well as the  relief that should provide to the patient.  She is on the cardiac monitor and continuous pulse oximeter and we will watch her carefully as I am evaluating her.  Anticipate that after the first said her pain will improve significantly and I think that an acute/emergent medical condition is unlikely.  There is no evidence of ischemia on her EKG and her vital signs are reassuring without any severe hypertension that might be consistent with an aortic dissection.  Clinical Course as of Aug 09 610  Thu Aug 09, 2018  0445 Negative influenza panel  Influenza panel by PCR (type A & B) [CF]  0445 CBC, CMP, lipase all within normal limits.  Creatinine is slightly elevated, but only to 1.2.   [CF]  2505 The patient has been sleeping comfortably.  I woke her easily with soft voice and light touch.  She says she feels much better.  She said her pain is gone except for leg pain which is her typical chronic pain.  She said that the pain she was experiencing all over her entire body is gone.  She is awake and alert and acting appropriate at this time.  I told her that her work-up was reassuring and that she can go back to her facility.  She says she wanted ask a question and told me that she takes her oxycodone every 6 hours but that her pain comes back every 4 hours.  She had mentioned this to the nurse previously but not to me.  I explained to her that her pain is managed by a pain management specialist as well as her other doctors and I will not be making changes to her regimen.  She says that she just wants to understand why they do not increase the dose and I explained that they come to the decision about the dosage for a variety of reasons and she can talk to her pain management doctor about it.  She says that she understands.  She is in no distress at this time and is comfortable with the plan to go back to her facility.  I gave my usual and customary return precautions.   [CF]    Clinical Course User Index [CF]  Hinda Kehr, MD    ____________________________________________  FINAL CLINICAL IMPRESSION(S) / ED DIAGNOSES  Final diagnoses:  Chronic pain syndrome     MEDICATIONS GIVEN DURING THIS VISIT:  Medications  midazolam (VERSED) injection 0.5 mg (0.5 mg Intravenous Given 08/09/18 0340)  ED Discharge Orders    None       Note:  This document was prepared using Dragon voice recognition software and may include unintentional dictation errors.   Hinda Kehr, MD 08/09/18 [phone]

## 2018-08-09 NOTE — ED Triage Notes (Signed)
Pt arrived to the ED via EMS from [facility] for complaints of generalized pain. Pt reports that she woke up in severe pain. Pt states that she suffers of fibromyalgia and the medication usually does not last the full 8hrs. Pt is AOx4 crying and moaning during triage. Dr. Karma Greaser is at bedside.

## 2018-08-09 NOTE — Discharge Instructions (Signed)
Your workup in the Emergency Department today was reassuring.  We did not find any specific abnormalities.  We recommend you drink plenty of fluids, take your regular medications as scheduled, and follow up with the doctor(s) listed in these documents as recommended.  Return to the Emergency Department if you develop new or worsening symptoms that concern you.

## 2018-08-14 ENCOUNTER — Other Ambulatory Visit: Payer: Self-pay

## 2018-08-14 ENCOUNTER — Ambulatory Visit (INDEPENDENT_AMBULATORY_CARE_PROVIDER_SITE_OTHER): Payer: Medicare Other | Admitting: Psychiatry

## 2018-08-14 ENCOUNTER — Encounter: Payer: Self-pay | Admitting: Psychiatry

## 2018-08-14 VITALS — BP 115/66 | HR 69 | Temp 97.8°F

## 2018-08-14 DIAGNOSIS — F3130 Bipolar disorder, current episode depressed, mild or moderate severity, unspecified: Secondary | ICD-10-CM | POA: Diagnosis not present

## 2018-08-14 DIAGNOSIS — F5105 Insomnia due to other mental disorder: Secondary | ICD-10-CM

## 2018-08-14 DIAGNOSIS — F411 Generalized anxiety disorder: Secondary | ICD-10-CM | POA: Diagnosis not present

## 2018-08-14 MED ORDER — LAMOTRIGINE 25 MG PO TABS: 75.0000 mg | ORAL_TABLET | ORAL | 0 refills | Status: DC

## 2018-08-14 MED ORDER — BUPROPION HCL ER (XL) 150 MG PO TB24: 150.0000 mg | ORAL_TABLET | Freq: Every day | ORAL | 1 refills | Status: DC

## 2018-08-14 NOTE — Patient Instructions (Signed)
Lamotrigine tablets What is this medicine? LAMOTRIGINE (la MOE Hendricks Limes) is used to control seizures in adults and children with epilepsy and Lennox-Gastaut syndrome. It is also used in adults to treat bipolar disorder. This medicine may be used for other purposes; ask your health care provider or pharmacist if you have questions. COMMON BRAND NAME(S): Lamictal, Subvenite What should I tell my health care provider before I take this medicine? They need to know if you have any of these conditions: -aseptic meningitis during prior use of lamotrigine -depression -folate deficiency -kidney disease -liver disease -suicidal thoughts, plans, or attempt; a previous suicide attempt by you or a family member -an unusual or allergic reaction to lamotrigine or other seizure medications, other medicines, foods, dyes, or preservatives -pregnant or trying to get pregnant -breast-feeding How should I use this medicine? Take this medicine by mouth with a glass of water. Follow the directions on the prescription label. Do not chew these tablets. If this medicine upsets your stomach, take it with food or milk. Take your doses at regular intervals. Do not take your medicine more often than directed. A special MedGuide will be given to you by the pharmacist with each new prescription and refill. Be sure to read this information carefully each time. Talk to your pediatrician regarding the use of this medicine in children. While this drug may be prescribed for children as young as 2 years for selected conditions, precautions do apply. Overdosage: If you think you have taken too much of this medicine contact a poison control center or emergency room at once. NOTE: This medicine is only for you. Do not share this medicine with others. What if I miss a dose? If you miss a dose, take it as soon as you can. If it is almost time for your next dose, take only that dose. Do not take double or extra doses. What may interact  with this medicine? -atazanavir -carbamazepine -female hormones, including contraceptive or birth control pills -lopinavir -methotrexate -phenobarbital -phenytoin -primidone -pyrimethamine -rifampin -ritonavir -trimethoprim -valproic acid This list may not describe all possible interactions. Give your health care provider a list of all the medicines, herbs, non-prescription drugs, or dietary supplements you use. Also tell them if you smoke, drink alcohol, or use illegal drugs. Some items may interact with your medicine. What should I watch for while using this medicine? Visit your doctor or health care professional for regular checks on your progress. If you take this medicine for seizures, wear a Medic Alert bracelet or necklace. Carry an identification card with information about your condition, medicines, and doctor or health care professional. It is important to take this medicine exactly as directed. When first starting treatment, your dose will need to be adjusted slowly. It may take weeks or months before your dose is stable. You should contact your doctor or health care professional if your seizures get worse or if you have any new types of seizures. Do not stop taking this medicine unless instructed by your doctor or health care professional. Stopping your medicine suddenly can increase your seizures or their severity. Contact your doctor or health care professional right away if you develop a rash while taking this medicine. Rashes may be very severe and sometimes require treatment in the hospital. Deaths from rashes have occurred. Serious rashes occur more often in children than adults taking this medicine. It is more common for these serious rashes to occur during the first 2 months of treatment, but a rash can occur at  any time. You may get drowsy, dizzy, or have blurred vision. Do not drive, use machinery, or do anything that needs mental alertness until you know how this medicine  affects you. To reduce dizzy or fainting spells, do not sit or stand up quickly, especially if you are an older patient. Alcohol can increase drowsiness and dizziness. Avoid alcoholic drinks. If you are taking this medicine for bipolar disorder, it is important to report any changes in your mood to your doctor or health care professional. If your condition gets worse, you get mentally depressed, feel very hyperactive or manic, have difficulty sleeping, or have thoughts of hurting yourself or committing suicide, you need to get help from your health care professional right away. If you are a caregiver for someone taking this medicine for bipolar disorder, you should also report these behavioral changes right away. The use of this medicine may increase the chance of suicidal thoughts or actions. Pay special attention to how you are responding while on this medicine. Your mouth may get dry. Chewing sugarless gum or sucking hard candy, and drinking plenty of water may help. Contact your doctor if the problem does not go away or is severe. Women who become pregnant while using this medicine may enroll in the [place] Pregnancy Registry by calling [phone]. This registry collects information about the safety of antiepileptic drug use during pregnancy. This medicine may cause a decrease in folic acid. You should make sure that you get enough folic acid while you are taking this medicine. Discuss the foods you eat and the vitamins you take with your health care professional. What side effects may I notice from receiving this medicine? Side effects that you should report to your doctor or health care professional as soon as possible: -allergic reactions like skin rash, itching or hives, swelling of the face, lips, or tongue -changes in vision -depressed mood -elevated mood, decreased need for sleep, racing thoughts, impulsive behavior -fever with rash, swollen lymph nodes, or  swelling of the face -loss of balance or coordination -mouth sores -redness, blistering, peeling or loosening of the skin, including inside the mouth -right upper belly pain -seizures -severe muscle pain -signs and symptoms of aseptic meningitis such as stiff neck and sensitivity to light, headache, drowsiness, fever, nausea, vomiting, rash -signs of infection - fever or chills, cough, sore throat, pain or difficulty passing urine -suicidal thoughts or other mood changes -swollen lymph nodes -trouble walking -unusual bruising or bleeding -unusually weak or tired -yellowing of the eyes or skin Side effects that usually do not require medical attention (report to your doctor or health care professional if they continue or are bothersome): -diarrhea -dizziness -dry mouth -stuffy nose -tiredness -tremors -trouble sleeping This list may not describe all possible side effects. Call your doctor for medical advice about side effects. You may report side effects to FDA at 1-800-FDA-1088. Where should I keep my medicine? Keep out of reach of children. Store at room temperature between 15 and 30 degrees C (59 and 86 degrees F). Throw away any unused medicine after the expiration date. NOTE: This sheet is a summary. It may not cover all possible information. If you have questions about this medicine, talk to your doctor, pharmacist, or health care provider.  2019 Elsevier/Gold Standard (2017-01-09 16:07:39)

## 2018-08-14 NOTE — Progress Notes (Signed)
[facility] MD OP Progress Note  08/14/2018 4:20 PM Stephanie [name]  MRN:  [id number]  Chief Complaint: 'I am here for follow up." Chief Complaint    Follow-up; Medication Refill     HPI: Stephanie [name] is a 68 year old Caucasian female, divorced, lives at [facility], on SSD, has a history of bipolar disorder, generalized anxiety disorder, RLS, chronic pain, fibromyalgia, insomnia, mild cognitive disorder, COPD, history of lung nodules, sleep apnea, ascites, status post cholecystectomy, history of hepatitis B, history of stroke, vitamin D deficiency, aortic dissection, thrombocytopenia, chronic anemia, hypertension, hyperlipidemia, presented to clinic today for a follow-up visit.  Patient presented with staff at [facility] who provided collateral information.  Patient reports she has been feeling depressed.  She appeared to be tearful in session today.  She reports there are times when she is sad and down, still struggles with lack of motivation.  Patient reports sleep is fair.  Patient reports she continues to struggle with pain and was seen in the emergency department recently.  Per collateral information obtained from staff who presented from the ALF-patient continues to have days when she is confused and has memory problems and seems to be depressed.  Had neurology consult with Dr. Melrose Nakayama last month.  He has started her on a medication called Aricept.  I have reviewed notes from Dr. Melrose Nakayama dated 07/17/2018-' start Aricept 5 mg at bedtime to slow the progression of memory loss, increase gabapentin to 600 mg in the morning 600 mg in the afternoon and 900 mg at bedtime.  Will order labs to check vitamin B12, TSH, vitamin D and folate.  Patient also will need an EMG of her legs.'   Visit Diagnosis:    ICD-10-CM   1. Bipolar I disorder, most recent episode depressed (HCC) F31.30 lamoTRIgine (LAMICTAL) 25 MG tablet    buPROPion (WELLBUTRIN XL) 150 MG 24 hr tablet  2. GAD (generalized  anxiety disorder) F41.1 buPROPion (WELLBUTRIN XL) 150 MG 24 hr tablet  3. Insomnia due to mental disorder F51.05     Past Psychiatric History: I have reviewed past psychiatric history from my progress note on 08/24/2017.  Past trials of Seroquel, trazodone, Ambien  Past Medical History:  Past Medical History:  Diagnosis Date  . Adenomatous colon polyp 08/27/2014   Polyps x 3  . Anemia   . Anxiety   . 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 Arthritis   . Asthma   . Bipolar affective disorder ([place])    h/o  . Blood transfusion 2000  . Blood transfusion without reported diagnosis   . Cancer ([place])   . Chronic abdominal pain    abdominal wall pain  . 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 Colon polyp   . COPD (chronic obstructive pulmonary disease) ([place])   . Depression   . Diabetes mellitus without complication ([place])   . Fatty liver 04/09/08   found in abd CT  . GERD (gastroesophageal reflux disease)   . H/O: CVA (cardiovascular accident)    TIA  . H/O: rheumatic fever   . Hepatitis    HEP "B" twenty years ago  . Hyperlipidemia   . Hypertension   . IBS (irritable bowel syndrome)   . Incontinence   . Insomnia   . Lower extremity edema   . Neuropathy    lower legs D/T DM  . Obstructive sleep apnea    Use C-PAP twice per week  . Platelets decreased ([place])   .  RLS (restless legs syndrome)   . Shortness of breath   . Sleep apnea   . Stroke [facility])    TIA- 2002  . Ulcer   . Unspecified disorders of nervous system   . Wears dentures    full upper and lower    Past Surgical History:  Procedure Laterality Date  . ABDOMINAL EXPLORATION SURGERY    . ABDOMINAL HYSTERECTOMY     total  . Axillary artery cannulation via 8-mm Hemashield graft, median sternotomy, extracorporeal circulation with deep hypothermic circulatory arrest, repair of  aortic dessection  01/23/2009   Dr Arlyce Dice  . BACK SURGERY      x 5  . cataract     both eyes  . CHOLECYSTECTOMY    . COLONOSCOPY    . EYE  SURGERY Bilateral    Cataract Extraction with IOL  . FACIAL LACERATION REPAIR Left 04/11/2018   Procedure: EARLOBE REPAIR;  Surgeon: Carloyn Manner, MD;  Location: [facility];  Service: ENT;  Laterality: Left;  Diabetic - insulin and oral meds sleep apnea  . HEMORROIDECTOMY     and colon polyp removed  . KNEE ARTHROSCOPY WITH LATERAL RELEASE Right 11/15/2016   Procedure: KNEE ARTHROSCOPY WITH LATERAL RELEASE;  Surgeon: Corky Mull, MD;  Location: [facility];  Service: Orthopedics;  Laterality: Right;  . LIVER BIOPSY  04/10/2012   Procedure: LIVER BIOPSY;  Surgeon: Inda Castle, MD;  Location: [facility];  Service: Endoscopy;  Laterality: N/A;  ultrasound to mark order for abd limited/liver to be marked to be sent by [email or web address]   . LUMBAR FUSION  10/09  . LUMBAR WOUND DEBRIDEMENT  05/09/2011   Procedure: LUMBAR WOUND DEBRIDEMENT;  Surgeon: Dahlia Bailiff;  Location: [facility];  Service: Orthopedics;  Laterality: N/A;  IRRIGATION AND DEBRIDEMENT SPINAL WOUND  . POSTERIOR CERVICAL FUSION/FORAMINOTOMY    . ROTATOR CUFF REPAIR     left    Family Psychiatric History: Reviewed family psychiatric history from my progress note on 08/24/2017  Family History:  Family History  Problem Relation Age of Onset  . Breast cancer Mother   . Lung cancer Mother   . Stomach cancer Father   . Diabetes Other        4 aunts, and 1 uncle  . Depression Brother   . Anxiety disorder Brother   . Colon cancer Neg Hx   . Esophageal cancer Neg Hx   . Rectal cancer Neg Hx     Social History: Reviewed social history from my progress note on 08/24/2017 Social History   Socioeconomic History  . Marital status: Divorced    Spouse name: Not on file  . Number of children: 2  . Years of education: Not on file  . Highest education level: 8th grade  Occupational History  . Occupation: Disabled    Employer: [employer]  Social Needs  . Financial resource strain: Not hard at all  . Food insecurity:     Worry: Never true    Inability: Never true  . Transportation needs:    Medical: No    Non-medical: No  Tobacco Use  . Smoking status: Former Smoker    Packs/day: 1.50    Years: 50.00    Pack years: 75.00    Types: Cigarettes    Last attempt to quit: 04/06/2013    Years since quitting: 5.3  . Smokeless tobacco: Never Used  Substance and Sexual Activity  . Alcohol use: No    Alcohol/week: 0.0 standard drinks  .  Drug use: No  . Sexual activity: Never  Lifestyle  . Physical activity:    Days per week: 0 days    Minutes per session: 0 min  . Stress: Very much  Relationships  . Social connections:    Talks on phone: Not on file    Gets together: Not on file    Attends religious service: Never    Active member of club or organization: No    Attends meetings of clubs or organizations: Never    Relationship status: Divorced  Other Topics Concern  . Not on file  Social History Narrative   1 son, 80+ y/o   Daily caffeine use: 2 cups daily   Does not get regular exercise   Disability due to bipolar      Has a living will- she is a DNR (she has form at home per pt).    Allergies:  Allergies  Allergen Reactions  . Cortisone     Increased BS  . Doxycycline Hives, Swelling, Other (See Comments) and Nausea Only    Reaction:  Facial swelling  Face red and swollen; no difficulty breathing    Metabolic Disorder Labs: Lab Results  Component Value Date   HGBA1C 5.2 07/20/2016   MPG 103 07/20/2016   MPG 131 04/18/2016   No results found for: PROLACTIN Lab Results  Component Value Date   CHOL 194 04/05/2016   TRIG 47 04/05/2016   HDL 110 04/05/2016   CHOLHDL 1.8 04/05/2016   VLDL 9 04/05/2016   LDLCALC 75 04/05/2016   LDLCALC 59 12/15/2014   Lab Results  Component Value Date   TSH 4.486 07/19/2017   TSH 4.24 12/15/2014    Therapeutic Level Labs: No results found for: LITHIUM No results found for: VALPROATE No components found for:  CBMZ  Current  Medications: Current Outpatient Medications  Medication Sig Dispense Refill  . albuterol (PROVENTIL HFA;VENTOLIN HFA) 108 (90 Base) MCG/ACT inhaler Inhale 2 puffs into the lungs every 6 (six) hours as needed for wheezing or shortness of breath.    Marland Kitchen albuterol (PROVENTIL) (2.5 MG/3ML) 0.083% nebulizer solution Take 2.5 mg by nebulization every 6 (six) hours as needed for wheezing or shortness of breath.    . bacitracin 500 UNIT/GM ointment Apply 1 application topically 2 (two) times daily. 15 g 0  . budesonide-formoterol (SYMBICORT) 160-4.5 MCG/ACT inhaler Inhale 2 puffs into the lungs 2 (two) times daily.     Marland Kitchen buPROPion (WELLBUTRIN XL) 150 MG 24 hr tablet Take 1 tablet (150 mg total) by mouth daily. 90 tablet 1  . clonazePAM (KLONOPIN) 0.5 MG tablet Take 0.5 mg by mouth 3 (three) times daily.     . diclofenac sodium (VOLTAREN) 1 % GEL Apply topically 3 (three) times daily.    Marland Kitchen dicyclomine (BENTYL) 10 MG capsule Take 10 mg by mouth 3 (three) times daily.     . ferrous sulfate 325 (65 FE) MG tablet Take 325 mg by mouth 2 (two) times daily.     . furosemide (LASIX) 80 MG tablet Take 80 mg by mouth.    . hydrochlorothiazide (MICROZIDE) 12.5 MG capsule Take 12.5 mg by mouth daily with supper.    Marland Kitchen HYDROcodone-acetaminophen (NORCO) 7.5-325 MG tablet Take 1 tablet by mouth every 6 (six) hours as needed for moderate pain.    . hydrOXYzine (ATARAX/VISTARIL) 10 MG tablet Take 10 mg by mouth every 8 (eight) hours as needed.    . insulin glargine (LANTUS) 100 UNIT/ML injection Inject 50 Units  into the skin every evening.     . insulin lispro (HUMALOG) 100 UNIT/ML injection Inject 8 Units into the skin 3 (three) times daily before meals.     . lactulose (CHRONULAC) 10 GM/15ML solution Take 10 g by mouth daily. (0800)    . lamoTRIgine (LAMICTAL) 100 MG tablet Take 1 tablet (100 mg total) by mouth 2 (two) times daily. (0800 & 2000) 180 tablet 1  . lamoTRIgine (LAMICTAL) 25 MG tablet Take 3 tablets (75 mg  total) by mouth as directed. Take 25 mg with 100 mg lamictal at 8 am and 50 mg  with 100 mg lamictal at 8 pm 270 tablet 0  . lidocaine (LIDODERM) 5 % Place 2 patches onto the skin daily. Apply 1 patch to 2 areas on back (2 patches) every morning and remove at bedtime.    . liraglutide (VICTOZA) 18 MG/3ML SOPN Inject 1.8 mg into the skin daily.    Marland Kitchen LYRICA 150 MG capsule Take 150 mg by mouth 3 (three) times daily. (0800, 1400 & 2000)    . metFORMIN (GLUCOPHAGE-XR) 500 MG 24 hr tablet Take 500 mg by mouth twice daily.    . metoCLOPramide (REGLAN) 5 MG tablet Take 5 mg by mouth 4 (four) times daily -  before meals and at bedtime. (0800, 1200, 1600 & 2000)    . MYRBETRIQ 25 MG TB24 tablet     . naloxone (NARCAN) nasal spray 4 mg/0.1 mL Place 1 spray into the nose.    . nystatin (MYCOSTATIN/NYSTOP) powder Apply topically 4 (four) times daily. (Patient taking differently: Apply topically 4 (four) times daily. Apply to inner thigh/ peri area twice daily and apply to abdomen 4 times daily.) 15 g 0  . omeprazole (PRILOSEC) 20 MG capsule Take 20 mg by mouth daily.     Marland Kitchen oxybutynin (DITROPAN-XL) 5 MG 24 hr tablet Take 5 mg by mouth daily. (0800)    . rOPINIRole (REQUIP) 3 MG tablet Take 3 mg by mouth at bedtime. (2000)    . senna (GERI-KOT) 8.6 MG tablet Take 1 tablet by mouth daily.    . simvastatin (ZOCOR) 10 MG tablet Take 10 mg by mouth at bedtime.    Marland Kitchen spironolactone (ALDACTONE) 50 MG tablet Take 50 mg by mouth 2 (two) times daily. (0800)    . trolamine salicylate (ASPERCREME) 10 % cream Apply 1 application topically 2 (two) times daily as needed for muscle pain.    Marland Kitchen zolpidem (AMBIEN) 10 MG tablet Take 1 tablet (10 mg total) by mouth at bedtime as needed for sleep. 30 tablet 3  . gabapentin (NEURONTIN) 600 MG tablet Take 1 tablet (600 mg total) by mouth 3 (three) times daily. 90 tablet 2  . gabapentin (NEURONTIN) 600 MG tablet      No current facility-administered medications for this visit.       Musculoskeletal: Strength & Muscle Tone: within normal limits Gait & Station: normal Patient leans: N/A  Psychiatric Specialty Exam: Review of Systems  Musculoskeletal: Positive for back pain.  Psychiatric/Behavioral: Positive for depression.  All other systems reviewed and are negative.   Blood pressure 115/66, pulse 69, temperature 97.8 F (36.6 C), temperature source Oral.There is no height or weight on file to calculate BMI.  General Appearance: Casual  Eye Contact:  Fair  Speech:  Clear and Coherent  Volume:  Normal  Mood:  Depressed  Affect:  Tearful  Thought Process:  Goal Directed and Descriptions of Associations: Intact  Orientation:  Full (Time, Place,  and Person)  Thought Content: Logical   Suicidal Thoughts:  No  Homicidal Thoughts:  No  Memory:  Immediate;   Fair Recent;   Fair Remote;   limited  Judgement:  Fair  Insight:  Fair  Psychomotor Activity:  Normal  Concentration:  Concentration: Fair and Attention Span: Fair  Recall:  [employer] of Knowledge: Fair  Language: Fair  Akathisia:  No  Handed:  Right  AIMS (if indicated): denies rigidity,stiffness  Assets:  Communication Skills Desire for Improvement Social Support  ADL's:  Intact  Cognition: Impaired,  Mild  Sleep:  Fair   Screenings: PHQ2-9     Clinical Support from 01/04/2018 in [facility] Clinical Support from 11/16/2017 in [facility] Office Visit from 10/18/2017 in [facility] Procedure visit from 10/04/2017 in [facility] Office Visit from 09/25/2017 in [facility]  PHQ-2 Total Score  0  0  1  0  0       Assessment and Plan: Stephanie [name] is a 71 year old Caucasian female, divorced, on SSD, has a history of bipolar, anxiety disorder, insomnia, RLS, sleep apnea, multiple medical  problems as summarized above, presented to clinic today for a follow-up visit.  Patient presented with staff at her assisted living facility.  Patient continues to struggle with some mood symptoms more so because of her chronic pain which is making her more depressed.  Discussed making medication changes.  She will continue her appointment with her neurologist.  Also discussed restarting psychotherapy sessions.  Plan Bipolar disorder- unstable Increase lamotrigine to 125 mg at 8 AM and 150 mg 8 PM. Continue Wellbutrin XL 150 mg p.o. daily  For GAD-improving Restart psychotherapy session with Ms. Peacock. Klonopin as prescribed.  For insomnia-improving Ambien 10 mg p.o. nightly as needed  For tremors- unstable Patient will continue to follow-up with neurology.  She had appointment with Dr. Melrose Nakayama on 07/17/2018 as summarized above.  I have reviewed his notes.  For cognitive changes-unstable Aricept 5 mg p.o. nightly.  I have reviewed Dr. Lannie Fields note from 07/17/2018 as summarized above.  We will continue to follow-up with her neurologist.  I have obtained collateral information from staff at her assisted living facility as summarized above.  I have reviewed labs-vitamin B12-07/17/2018-290-low-patient will need replacement. She will follow-up with her primary medical doctor. TSH-07/17/2018-1.222-within normal limits.  I have spent atleast 25 minutes face to face with patient today. More than 50 % of the time was spent for psychoeducation and supportive psychotherapy and care coordination.  This note was generated in part or whole with voice recognition software. Voice recognition is usually quite accurate but there are transcription errors that can and very often do occur. I apologize for any typographical errors that were not detected and corrected.         Ursula Alert, MD 08/14/2018, 4:20 PM

## 2018-08-21 IMAGING — CR DG KNEE COMPLETE 4+V*L*
4 series · 4 of 4 positions shown · non-contrast
Comparison: 11/30/2017

CLINICAL DATA: Fall, left knee pain

EXAM:
LEFT KNEE - COMPLETE 4+ VIEW

[knee ap]
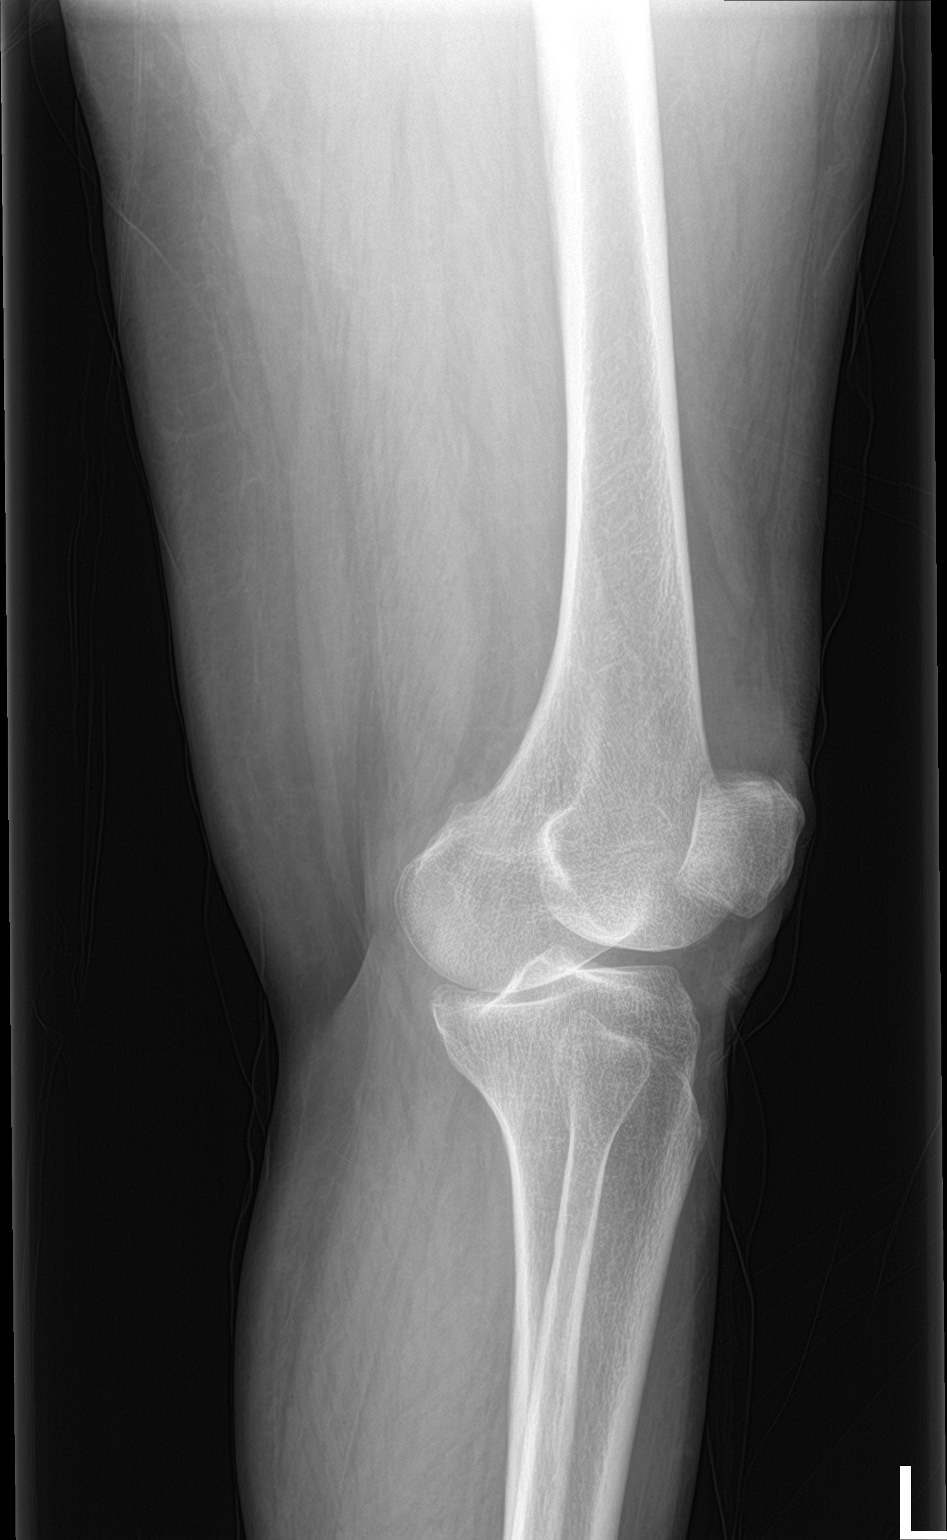

[knee obl (1 of 2)]
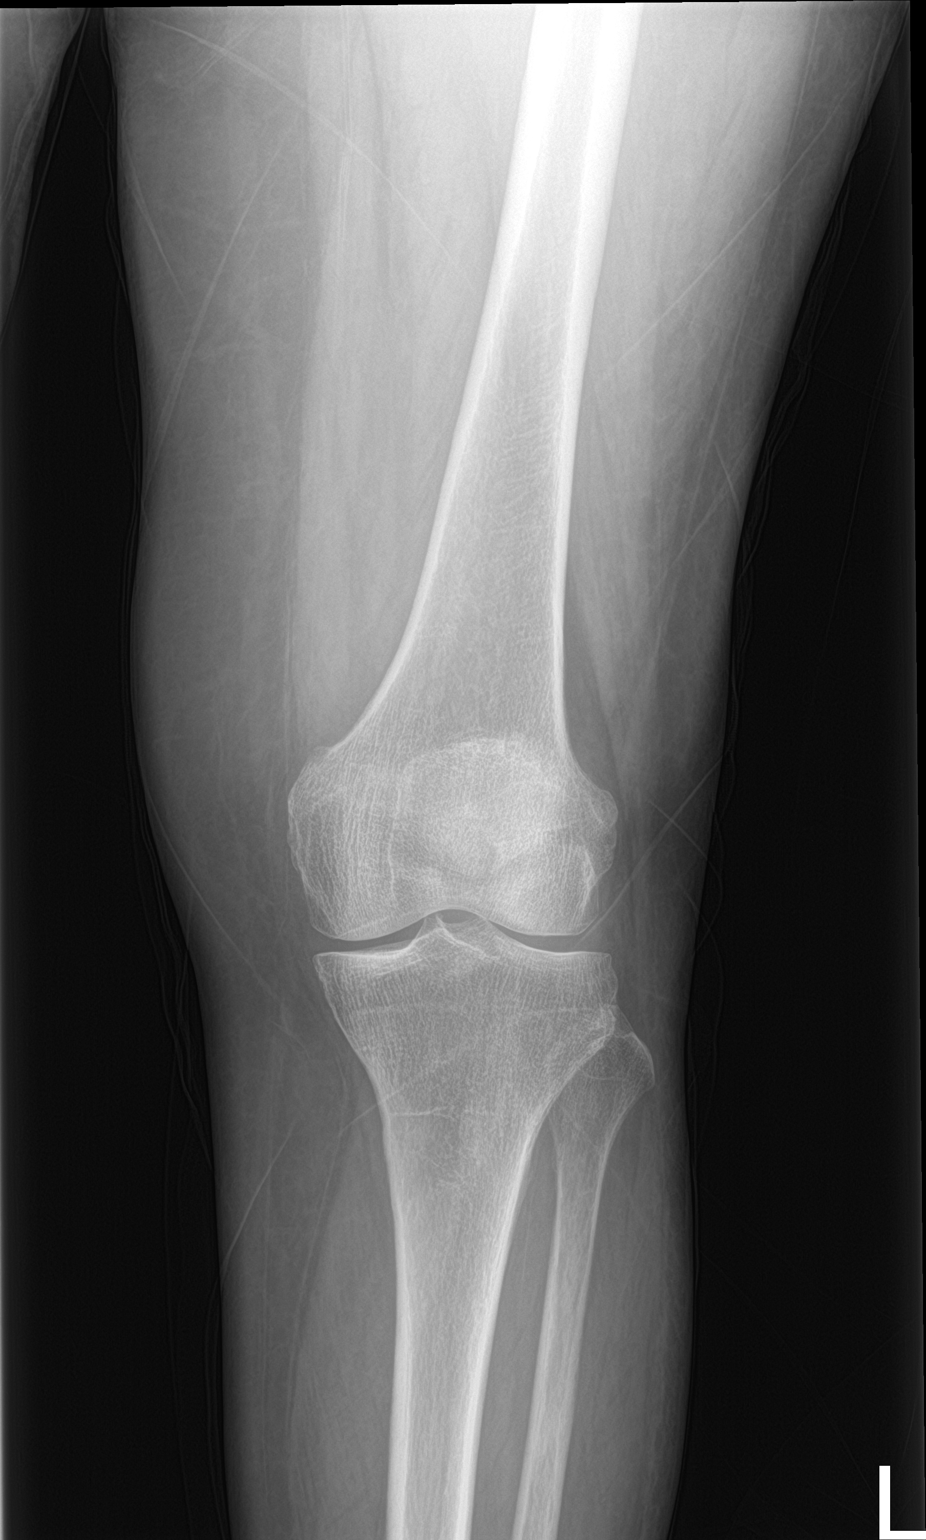

[knee obl (2 of 2)]
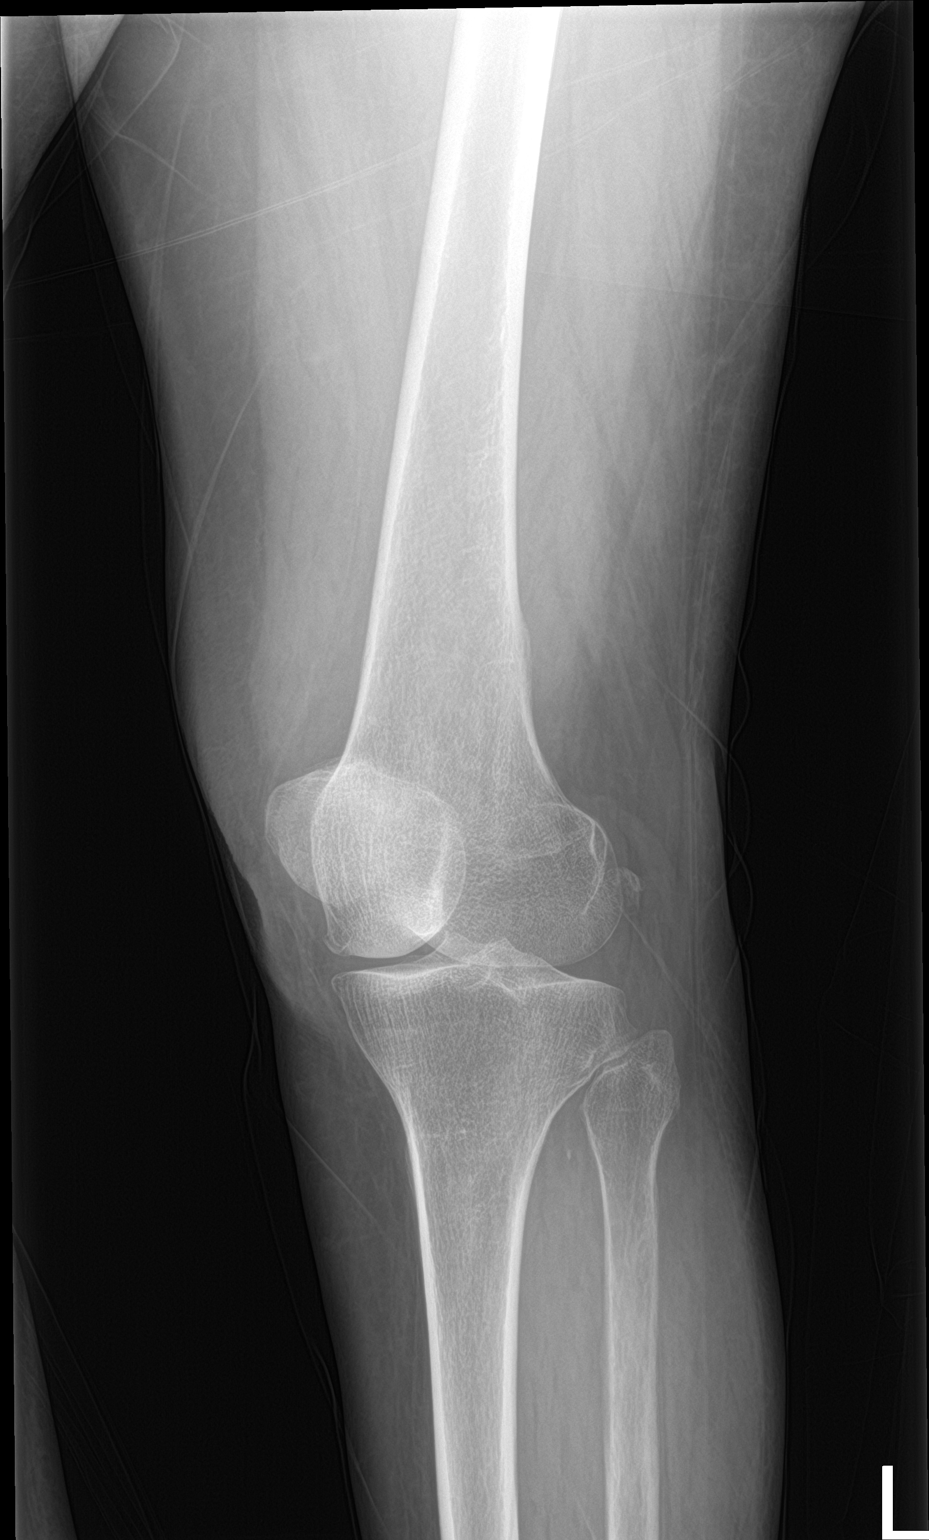

[knee lat]
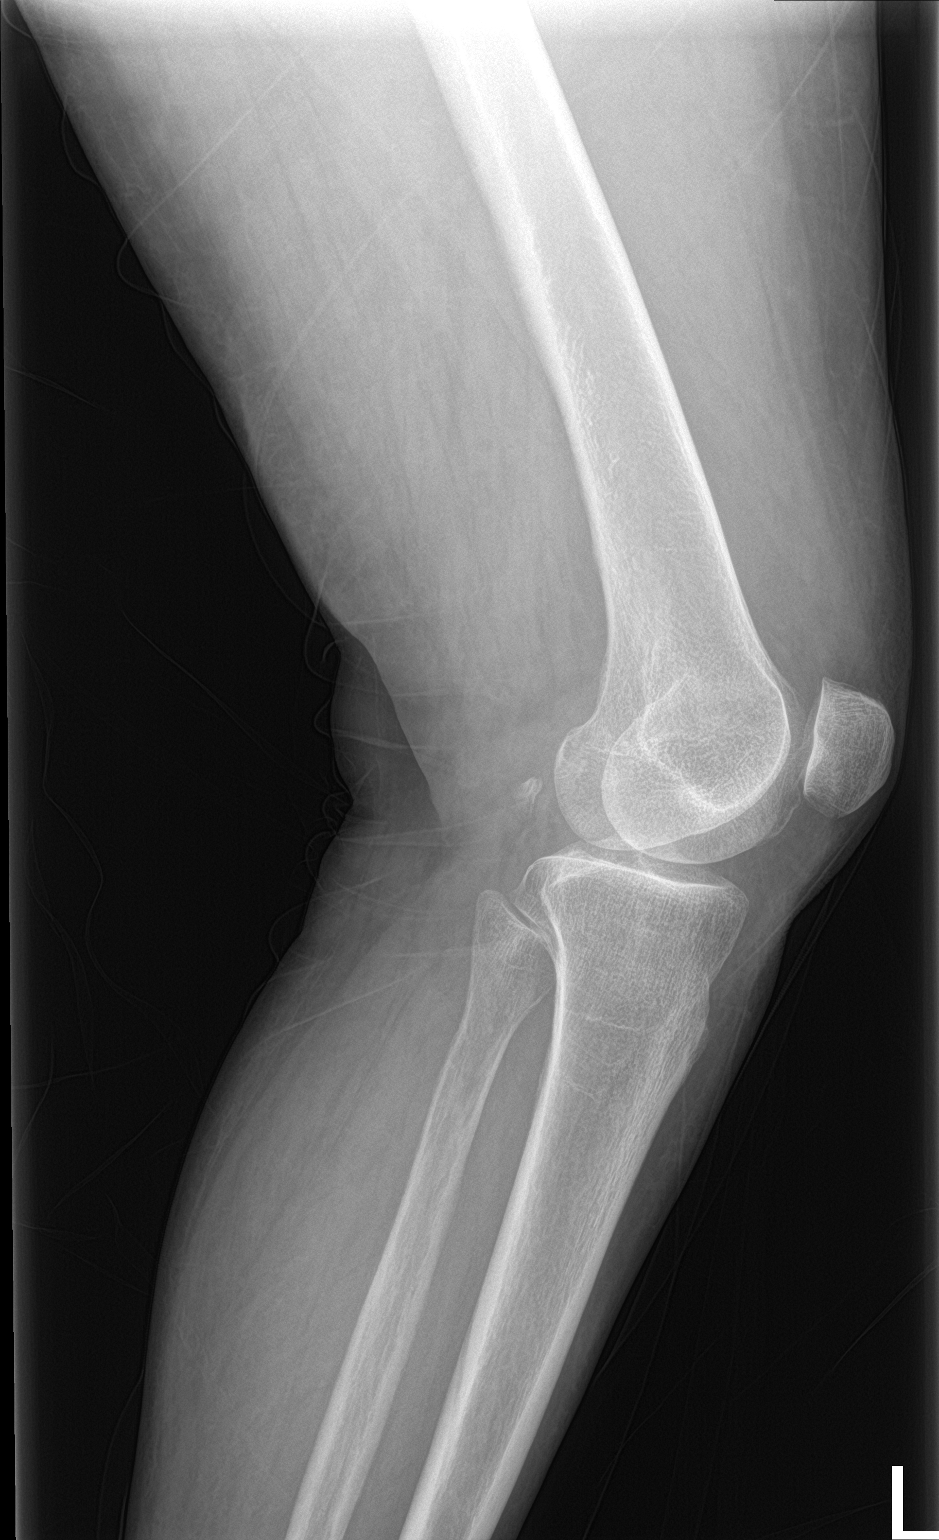

[4 of 4 positions shown; findings below may reference images not displayed]

FINDINGS: No acute bony abnormality. Specifically, no fracture, subluxation,
or dislocation. No joint effusion. Joint spaces maintained.
IMPRESSION: No acute bony abnormality.

## 2018-08-21 IMAGING — CR DG HIP (WITH OR WITHOUT PELVIS) 2-3V*L*
3 series · 3 of 3 positions shown · non-contrast
Comparison: 11/05/2017

CLINICAL DATA: Fall, left hip and knee pain

EXAM:
DG HIP (WITH OR WITHOUT PELVIS) 2-3V LEFT

[pelvis ap]
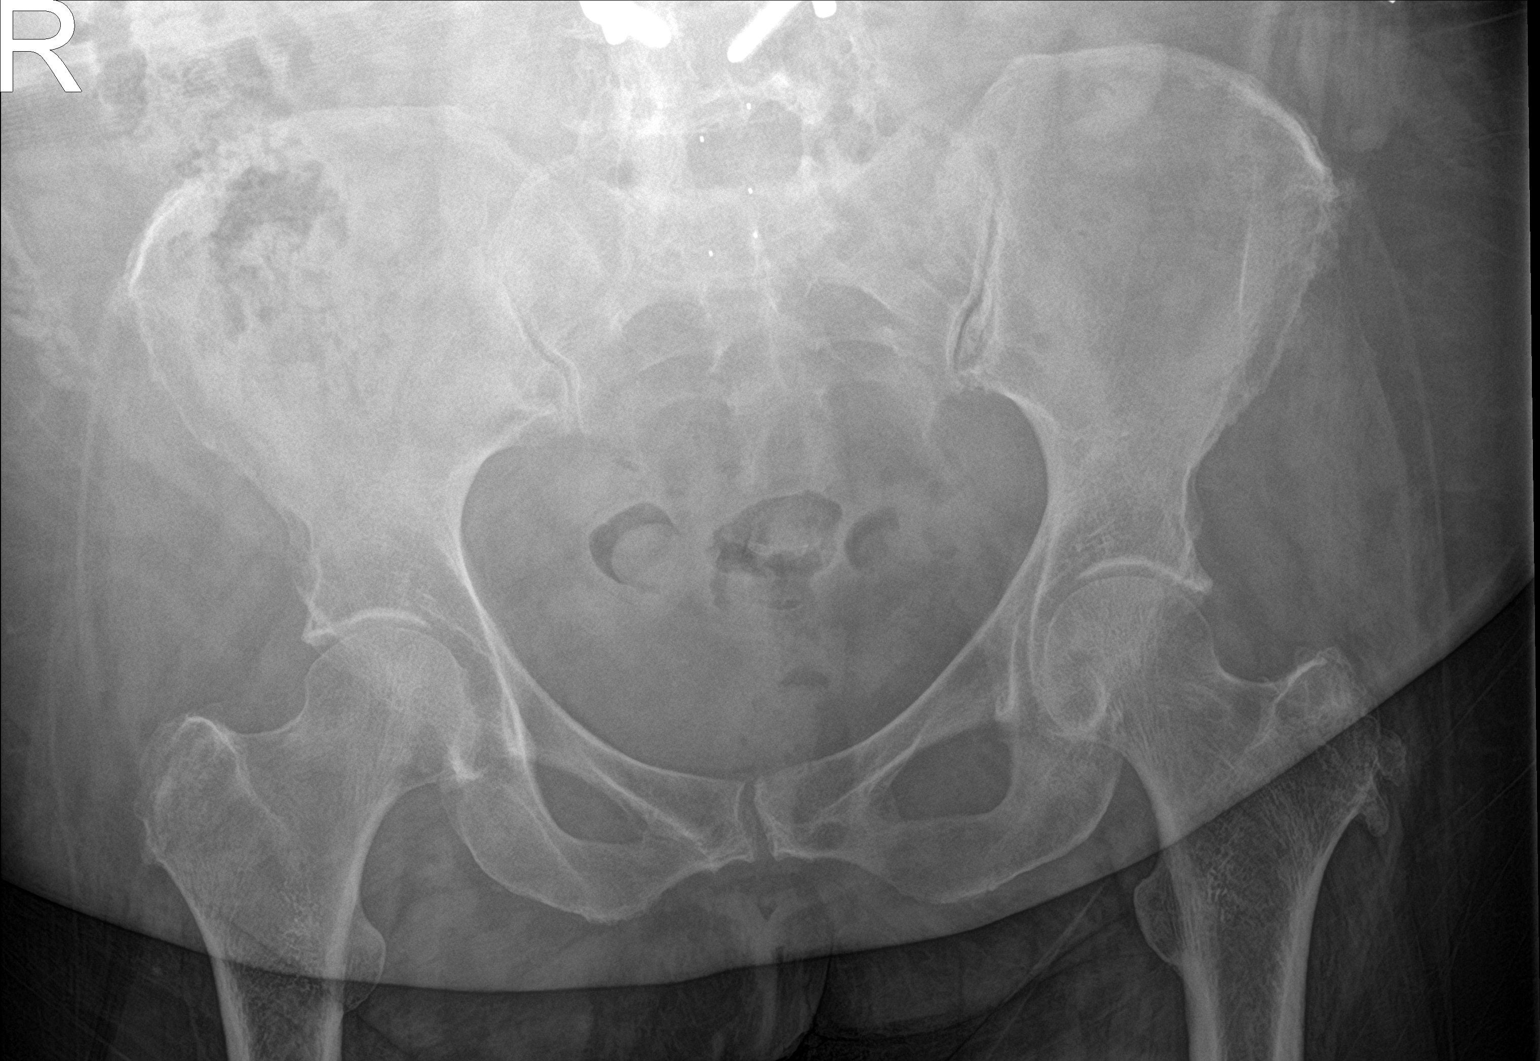

[hip ap]
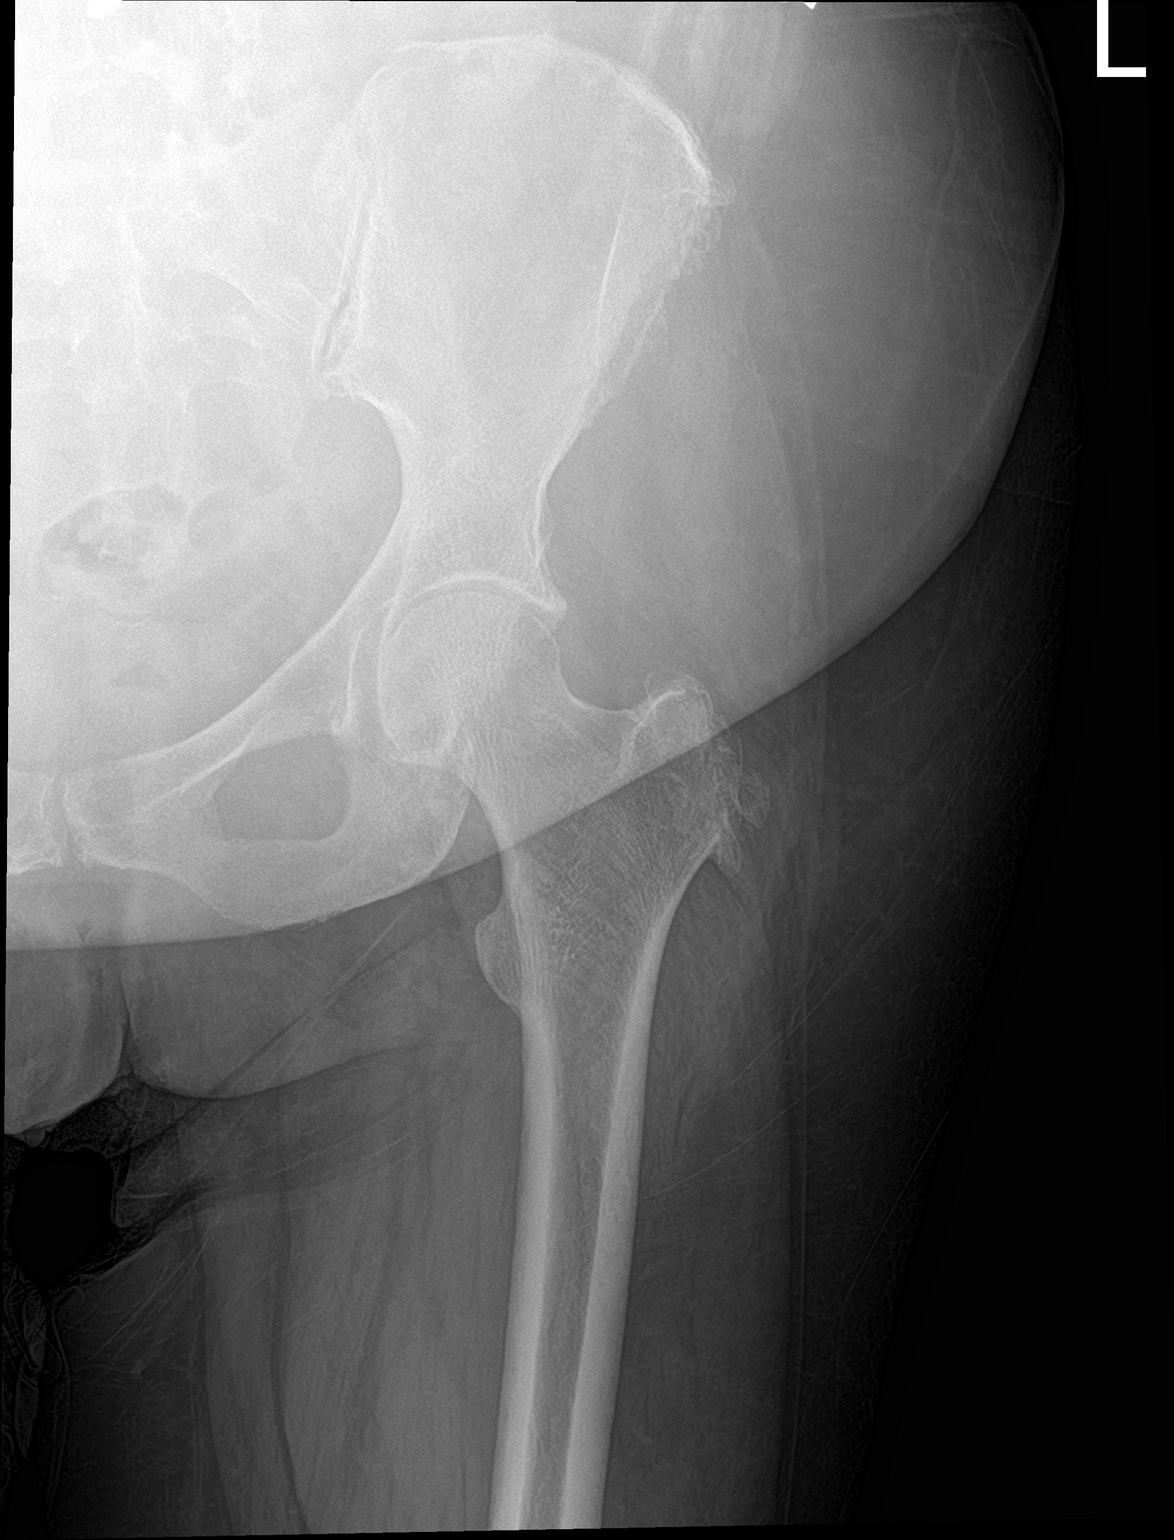

[hip lat]
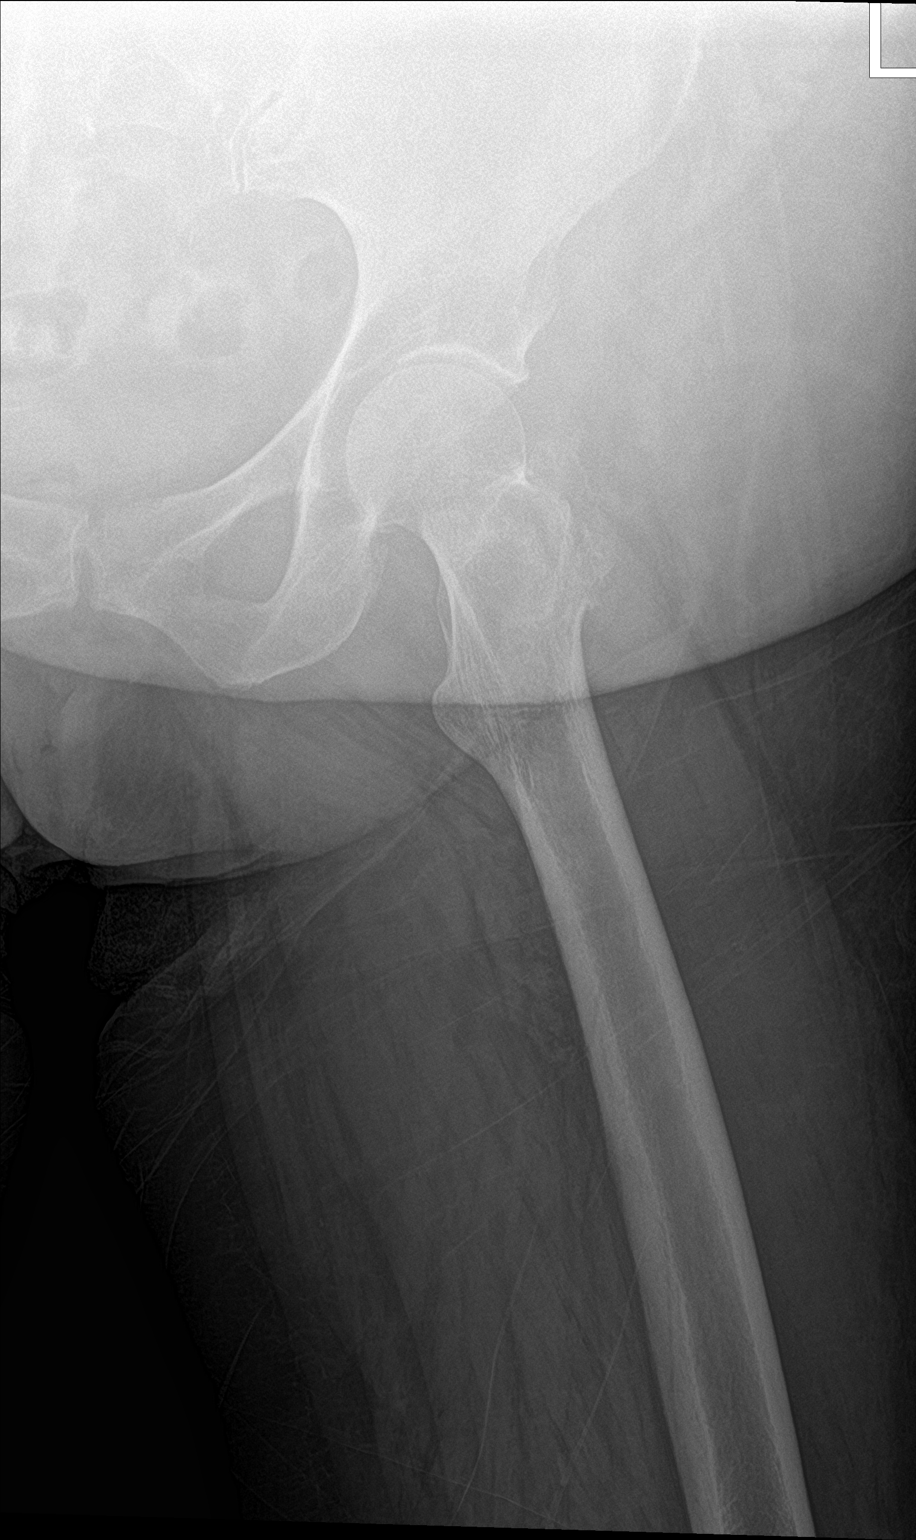

[3 of 3 positions shown; findings below may reference images not displayed]

FINDINGS: Enthesopathic changes on the greater trochanter. No acute bony
abnormality. Specifically, no fracture, subluxation, or dislocation.
Hip joints and SI joints are symmetric.
IMPRESSION: No acute bony abnormality.

## 2018-08-29 ENCOUNTER — Ambulatory Visit: Payer: Medicare Other | Admitting: Pulmonary Disease

## 2018-08-31 ENCOUNTER — Ambulatory Visit: Payer: Medicare Other | Admitting: Pulmonary Disease

## 2018-09-10 ENCOUNTER — Emergency Department: Payer: Medicare Other

## 2018-09-10 ENCOUNTER — Ambulatory Visit (INDEPENDENT_AMBULATORY_CARE_PROVIDER_SITE_OTHER): Payer: Medicare Other | Admitting: Licensed Clinical Social Worker

## 2018-09-10 ENCOUNTER — Emergency Department
Admission: EM | Admit: 2018-09-10 | Discharge: 2018-09-10 | Disposition: A | Discharge status: Home / Self Care | Payer: Medicare Other | Attending: Emergency Medicine | Admitting: Emergency Medicine

## 2018-09-10 ENCOUNTER — Other Ambulatory Visit: Payer: Self-pay

## 2018-09-10 DIAGNOSIS — F3130 Bipolar disorder, current episode depressed, mild or moderate severity, unspecified: Secondary | ICD-10-CM

## 2018-09-10 DIAGNOSIS — Z66 Do not resuscitate: Secondary | ICD-10-CM | POA: Diagnosis not present

## 2018-09-10 DIAGNOSIS — F419 Anxiety disorder, unspecified: Secondary | ICD-10-CM | POA: Diagnosis not present

## 2018-09-10 DIAGNOSIS — I110 Hypertensive heart disease with heart failure: Secondary | ICD-10-CM | POA: Diagnosis not present

## 2018-09-10 DIAGNOSIS — J449 Chronic obstructive pulmonary disease, unspecified: Secondary | ICD-10-CM | POA: Insufficient documentation

## 2018-09-10 DIAGNOSIS — I5032 Chronic diastolic (congestive) heart failure: Secondary | ICD-10-CM | POA: Insufficient documentation

## 2018-09-10 DIAGNOSIS — Z8673 Personal history of transient ischemic attack (TIA), and cerebral infarction without residual deficits: Secondary | ICD-10-CM | POA: Diagnosis not present

## 2018-09-10 DIAGNOSIS — J45909 Unspecified asthma, uncomplicated: Secondary | ICD-10-CM | POA: Diagnosis not present

## 2018-09-10 DIAGNOSIS — Z87891 Personal history of nicotine dependence: Secondary | ICD-10-CM | POA: Diagnosis not present

## 2018-09-10 DIAGNOSIS — R079 Chest pain, unspecified: Secondary | ICD-10-CM | POA: Insufficient documentation

## 2018-09-10 DIAGNOSIS — F319 Bipolar disorder, unspecified: Secondary | ICD-10-CM | POA: Insufficient documentation

## 2018-09-10 DIAGNOSIS — Z9049 Acquired absence of other specified parts of digestive tract: Secondary | ICD-10-CM | POA: Insufficient documentation

## 2018-09-10 LAB — CBC
HCT: 39.2 % (ref 36.0–46.0)
Hemoglobin: 12.8 g/dL (ref 12.0–15.0)
MCH: 30.1 pg (ref 26.0–34.0)
MCHC: 32.7 g/dL (ref 30.0–36.0)
MCV: 92.2 fL (ref 80.0–100.0)
Platelets: 97 10*3/uL — ABNORMAL LOW (ref 150–400)
RBC: 4.25 MIL/uL (ref 3.87–5.11)
RDW: 15.6 % — ABNORMAL HIGH (ref 11.5–15.5)
WBC: 11 10*3/uL — ABNORMAL HIGH (ref 4.0–10.5)
nRBC: 0 % (ref 0.0–0.2)

## 2018-09-10 LAB — BASIC METABOLIC PANEL
Anion gap: 10 (ref 5–15)
BUN: 25 mg/dL — ABNORMAL HIGH (ref 8–23)
CO2: 30 mmol/L (ref 22–32)
Calcium: 9.4 mg/dL (ref 8.9–10.3)
Chloride: 96 mmol/L — ABNORMAL LOW (ref 98–111)
Creatinine, Ser: 1.26 mg/dL — ABNORMAL HIGH (ref 0.44–1.00)
GFR calc Af Amer: 51 mL/min — ABNORMAL LOW (ref >60)
GFR calc non Af Amer: 44 mL/min — ABNORMAL LOW (ref >60)
Glucose, Bld: 200 mg/dL — ABNORMAL HIGH (ref 70–99)
Potassium: 4.1 mmol/L (ref 3.5–5.1)
Sodium: 136 mmol/L (ref 135–145)

## 2018-09-10 LAB — TROPONIN I: Troponin I: <0.03 ng/mL (ref <0.03)

## 2018-09-10 MED ORDER — DIAZEPAM 5 MG PO TABS
5.0000 mg | ORAL_TABLET | Freq: Once | ORAL | Status: AC
  Administered 2018-09-10: 15:00:00 5 mg via ORAL
  Filled 2018-09-10: qty 1

## 2018-09-10 MED ORDER — DIAZEPAM 5 MG PO TABS: 5.0000 mg | ORAL_TABLET | Freq: Three times a day (TID) | ORAL | 0 refills | Status: DC | PRN

## 2018-09-10 NOTE — ED Provider Notes (Signed)
[facility] Emergency Department Provider Note       Time seen: ----------------------------------------- 1:34 PM on 09/10/2018 -----------------------------------------   I have reviewed the triage vital signs and the nursing notes.  HISTORY   Chief Complaint Chest Pain    HPI Stephanie [name] is a 68 y.o. female with a history of anemia, anxiety, bipolar disorder, chronic abdominal pain, depression, diabetes, hypertension, TIA who presents to the ED for chest pain that worsened today.  Reportedly she has a history of chronic pain, states this pain is different.  Any touching of the chest or manipulation of the chest makes it worse.  She denies fevers, chills or other complaints.  Past Medical History:  Diagnosis Date  . Adenomatous colon polyp 08/27/2014   Polyps x 3  . Anemia   . Anxiety   . 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 Arthritis   . Asthma   . Bipolar affective disorder ([place])    h/o  . Blood transfusion 2000  . Blood transfusion without reported diagnosis   . Cancer ([place])   . Chronic abdominal pain    abdominal wall pain  . 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 Colon polyp   . COPD (chronic obstructive pulmonary disease) ([place])   . Depression   . Diabetes mellitus without complication ([place])   . Fatty liver 04/09/08   found in abd CT  . GERD (gastroesophageal reflux disease)   . H/O: CVA (cardiovascular accident)    TIA  . H/O: rheumatic fever   . Hepatitis    HEP "B" twenty years ago  . Hyperlipidemia   . Hypertension   . IBS (irritable bowel syndrome)   . Incontinence   . Insomnia   . Lower extremity edema   . Neuropathy    lower legs D/T DM  . Obstructive sleep apnea    Use C-PAP twice per week  . Platelets decreased ([place])   . RLS (restless legs syndrome)   . Shortness of breath   . Sleep apnea   . Stroke [facility])    TIA- 2002  . Ulcer   . Unspecified disorders of nervous system   . Wears dentures    full upper  and lower    Patient Active Problem List   Diagnosis Date Noted  . Loss of memory 07/24/2018  . Numbness and tingling of both legs 07/24/2018  . Obesity (BMI 30-39.9) 07/19/2018  . Chronic diastolic CHF (congestive heart failure), NYHA class 2 ([place]) 07/17/2018  . Mild mitral regurgitation 07/05/2018  . Unsteady gait 04/09/2018  . Diabetic peripheral neuropathy associated with type 2 diabetes mellitus ([place]) 04/09/2018  . Laceration without foreign body, right lower leg, initial encounter 04/09/2018  . Chronic obstructive pulmonary disease, unspecified ([place]) 04/09/2018  . Unilateral primary osteoarthritis, right knee 04/09/2018  . Sleep apnea 04/09/2018  . Chronic hip pain, right 04/09/2018  . Closed impacted fracture of hip ([place]) 04/09/2018  . Tricompartment osteoarthritis of right knee 10/18/2017  . Sepsis ([place]) 07/20/2017  . Obesity, Class III, BMI 40-49.9 (morbid obesity) ([place]) 05/19/2017  . Cervical stenosis of spine 04/25/2017  . Stress fracture of femur 04/17/2017  . Elevated alkaline phosphatase level 04/16/2017  . Altered mental status 03/07/2017  . Degenerative tear of lateral meniscus of right knee 11/15/2016  . Encephalopathy 07/19/2016  . Degenerative tear of medial meniscus of right knee 07/01/2016  . Primary osteoarthritis of right knee 07/01/2016  . Diabetes mellitus type  1, uncontrolled, without complications ([place]) 81/19/1478  . Lymphedema 06/14/2016  . Pain in limb 05/06/2016  . Swelling of limb 05/06/2016  . Postphlebitic syndrome with inflammation 05/06/2016  . Pneumonia 04/18/2016  . Chest pain 04/04/2016  . Iron deficiency anemia 01/30/2016  . Bilateral carotid artery stenosis 01/12/2016  . Hypoglycemia 09/09/2015  . Hyperglycemia 07/16/2015  . Neuropathy 06/08/2015  . Thrombocytopenia ([place]) 08/19/2014  . Abdominal pain, chronic, epigastric 06/12/2014  . Liver cirrhosis secondary to NASH ([place]) 02/27/2014  . Dizziness and giddiness 10/30/2013  . DOE  (dyspnea on exertion) 10/30/2013  . Poorly controlled type 2 diabetes mellitus with complication ([place]) 29/56/2130  . DNR (do not resuscitate) 07/29/2013  . Encounter for routine gynecological examination 07/29/2013  . Dyspnea 07/10/2013  . Lung nodule 07/10/2013  . Varicose veins of left lower extremity 10/09/2012  . Gastroesophageal reflux disease 10/09/2012  . Lower extremity edema 02/02/2012  . Cirrhosis of liver ([place]) 02/02/2012  . Anxiety disorder 07/11/2011  . Aortic dissection ([place])   . BACK PAIN, LUMBAR, CHRONIC 11/19/2009  . Chronic pain syndrome 11/06/2009  . UNS ADVRS EFF OTH RX MEDICINAL&BIOLOGICAL SBSTNC 12/16/2008  . Irritable bowel syndrome with constipation 10/23/2008  . UNSPECIFIED VITAMIN D DEFICIENCY 09/26/2008  . TOBACCO ABUSE 06/19/2008  . EDEMA, LOCALIZED 01/07/2008  . Restless legs syndrome 04/27/2007  . INSOMNIA 04/27/2007  . Hyperlipidemia 03/08/2007  . OSA (obstructive sleep apnea) 03/08/2007  . Hypertension 03/08/2007  . Asthma 03/08/2007  . Urinary incontinence 03/08/2007  . BIPOLAR AFFECTIVE DISORDER, HX OF 03/08/2007    Past Surgical History:  Procedure Laterality Date  . ABDOMINAL EXPLORATION SURGERY    . ABDOMINAL HYSTERECTOMY     total  . Axillary artery cannulation via 8-mm Hemashield graft, median sternotomy, extracorporeal circulation with deep hypothermic circulatory arrest, repair of  aortic dessection  01/23/2009   Dr Arlyce Dice  . BACK SURGERY      x 5  . cataract     both eyes  . CHOLECYSTECTOMY    . COLONOSCOPY    . EYE SURGERY Bilateral    Cataract Extraction with IOL  . FACIAL LACERATION REPAIR Left 04/11/2018   Procedure: EARLOBE REPAIR;  Surgeon: Carloyn Manner, MD;  Location: [place];  Service: ENT;  Laterality: Left;  Diabetic - insulin and oral meds sleep apnea  . HEMORROIDECTOMY     and colon polyp removed  . KNEE ARTHROSCOPY WITH LATERAL RELEASE Right 11/15/2016   Procedure: KNEE ARTHROSCOPY WITH LATERAL  RELEASE;  Surgeon: Corky Mull, MD;  Location: [facility];  Service: Orthopedics;  Laterality: Right;  . LIVER BIOPSY  04/10/2012   Procedure: LIVER BIOPSY;  Surgeon: Inda Castle, MD;  Location: [facility];  Service: Endoscopy;  Laterality: N/A;  ultrasound to mark order for abd limited/liver to be marked to be sent by [email or web address]   . LUMBAR FUSION  10/09  . LUMBAR WOUND DEBRIDEMENT  05/09/2011   Procedure: LUMBAR WOUND DEBRIDEMENT;  Surgeon: Dahlia Bailiff;  Location: [place];  Service: Orthopedics;  Laterality: N/A;  IRRIGATION AND DEBRIDEMENT SPINAL WOUND  . POSTERIOR CERVICAL FUSION/FORAMINOTOMY    . ROTATOR CUFF REPAIR     left    Allergies Cortisone and Doxycycline  Social History Social History   Tobacco Use  . Smoking status: Former Smoker    Packs/day: 1.50    Years: 50.00    Pack years: 75.00    Types: Cigarettes    Last attempt to quit: 04/06/2013    Years since quitting: 5.4  .  Smokeless tobacco: Never Used  Substance Use Topics  . Alcohol use: No    Alcohol/week: 0.0 standard drinks  . Drug use: No   Review of Systems Constitutional: Negative for fever. Cardiovascular: Positive for chest pain Respiratory: Negative for shortness of breath. Gastrointestinal: Negative for abdominal pain, vomiting and diarrhea. Musculoskeletal: Negative for back pain. Skin: Negative for rash. Neurological: Negative for headaches, focal weakness or numbness.  All systems negative/normal/unremarkable except as stated in the HPI  ____________________________________________   PHYSICAL EXAM:  VITAL SIGNS: ED Triage Vitals [09/10/18 1332]  Enc Vitals Group     BP      Pulse      Resp      Temp      Temp src      SpO2 95 %     Weight      Height      Head Circumference      Peak Flow      Pain Score      Pain Loc      Pain Edu?      Excl. in [place]?    Constitutional: Alert and oriented.  Anxious and tearful, no distress Eyes: Conjunctivae are normal. Normal  extraocular movements. ENT      Head: Normocephalic and atraumatic.      Nose: No congestion/rhinnorhea.      Mouth/Throat: Mucous membranes are moist.      Neck: No stridor. Cardiovascular: Normal rate, regular rhythm. No murmurs, rubs, or gallops.  Reproducible chest wall tenderness Respiratory: Normal respiratory effort without tachypnea nor retractions. Breath sounds are clear and equal bilaterally. No wheezes/rales/rhonchi. Gastrointestinal: Soft and nontender. Normal bowel sounds Musculoskeletal: Nontender with normal range of motion in extremities. No lower extremity tenderness nor edema. Neurologic:  Normal speech and language. No gross focal neurologic deficits are appreciated.  Skin:  Skin is warm, dry and intact. No rash noted. Psychiatric: Mood and affect are normal. Speech and behavior are normal.  ____________________________________________  EKG: Interpreted by me.  Sinus rhythm with a rate of 94 bpm, low voltage, nonspecific ST changes  ____________________________________________  ED COURSE:  As part of my medical decision making, I reviewed the following data within the [place] History obtained from family if available, nursing notes, old chart and ekg, as well as notes from prior ED visits. Patient presented for chest pain, we will assess with labs and imaging as indicated at this time.   Procedures Stephanie [name] was evaluated in Emergency Department on 09/10/2018 for the symptoms described in the history of present illness. She was evaluated in the context of the global COVID-19 pandemic, which necessitated consideration that the patient might be at risk for infection with the SARS-CoV-2 virus that causes COVID-19. Institutional protocols and algorithms that pertain to the evaluation of patients at risk for COVID-19 are in a state of rapid change based on information released by regulatory bodies including the CDC and federal and state organizations.  These policies and algorithms were followed during the patient's care in the ED.  ____________________________________________   LABS (pertinent positives/negatives)  Labs Reviewed  BASIC METABOLIC PANEL - Abnormal; Notable for the following components:      Result Value   Chloride 96 (*)    Glucose, Bld 200 (*)    BUN 25 (*)    Creatinine, Ser 1.26 (*)    GFR calc non Af Amer 44 (*)    GFR calc Af Amer 51 (*)    All other  components within normal limits  CBC - Abnormal; Notable for the following components:   WBC 11.0 (*)    RDW 15.6 (*)    All other components within normal limits  TROPONIN I    RADIOLOGY Images were viewed by me  Chest x-ray  IMPRESSION: No acute cardiopulmonary disease. ____________________________________________   DIFFERENTIAL DIAGNOSIS   Musculoskeletal pain, GERD, anxiety, spasm, unstable angina unlikely, PE unlikely  FINAL ASSESSMENT AND PLAN  Chest pain   Plan: The patient had presented for chest wall pain. Patient's labs did not reveal any acute process and troponin was negative. Patient's imaging was reassuring.  Pain seems to be musculoskeletal, she will be discharged home as such.  I have advised that she return for worsening or worrisome symptoms.   Laurence Aly, MD    Note: This note was generated in part or whole with voice recognition software. Voice recognition is usually quite accurate but there are transcription errors that can and very often do occur. I apologize for any typographical errors that were not detected and corrected.     Earleen Newport, MD 09/10/18 [phone]

## 2018-09-10 NOTE — ED Notes (Signed)
[facility]  CALLED  FOR  TRANSPORT

## 2018-09-10 NOTE — ED Triage Notes (Signed)
Pt arrives via ems from the [facility] w/ c/o bilateral pleural chest pain. Ems reports pt chest pain is reproducible with palpation, pt afebrile temp 98.3. on arrival pt a&o x 4. Pt can speak in short sentences but moaning with movement and when inhaling deeply.

## 2018-09-10 NOTE — ED Notes (Signed)
Patient discharged back to [facility] facility via [place]. Report called and patient stable at discharge.

## 2018-09-11 ENCOUNTER — Other Ambulatory Visit: Payer: Self-pay

## 2018-09-11 ENCOUNTER — Encounter: Payer: Self-pay | Admitting: Psychiatry

## 2018-09-11 ENCOUNTER — Telehealth: Payer: Self-pay

## 2018-09-11 ENCOUNTER — Ambulatory Visit (INDEPENDENT_AMBULATORY_CARE_PROVIDER_SITE_OTHER): Payer: Medicare Other | Admitting: Psychiatry

## 2018-09-11 DIAGNOSIS — F5105 Insomnia due to other mental disorder: Secondary | ICD-10-CM

## 2018-09-11 DIAGNOSIS — F3130 Bipolar disorder, current episode depressed, mild or moderate severity, unspecified: Secondary | ICD-10-CM | POA: Diagnosis not present

## 2018-09-11 DIAGNOSIS — F411 Generalized anxiety disorder: Secondary | ICD-10-CM | POA: Diagnosis not present

## 2018-09-11 MED ORDER — GABAPENTIN 600 MG PO TABS: 600.0000 mg | ORAL_TABLET | Freq: Three times a day (TID) | ORAL | 2 refills | Status: DC

## 2018-09-11 NOTE — Progress Notes (Signed)
Virtual Visit via Telephone Note  I connected with Stephanie [name] on 09/11/18 at 10:00 AM EDT by telephone and verified that I am speaking with the correct person using two identifiers.   I discussed the limitations, risks, security and privacy concerns of performing an evaluation and management service by telephone and the availability of in person appointments. I also discussed with the patient that there may be a patient responsible charge related to this service. The patient expressed understanding and agreed to proceed.   I discussed the assessment and treatment plan with the patient. The patient was provided an opportunity to ask questions and all were answered. The patient agreed with the plan and demonstrated an understanding of the instructions.   The patient was advised to call back or seek an in-person evaluation if the symptoms worsen or if the condition fails to improve as anticipated.  I provided 15 minutes of non-face-to-face time during this encounter.   Ursula Alert, MD  [place] MD  OP Progress Note  09/11/2018 11:20 AM Stephanie [name]  MRN:  [id number]  Chief Complaint:  Chief Complaint    Follow-up     HPI: Stephanie [name] is a 68 year old Caucasian female, divorced, lives at [facility] assisted living facility, on SSD, has a history of bipolar disorder, generalized anxiety disorder, RLS, chronic pain, fibromyalgia, insomnia, mild cognitive disorder, COPD, history of lung nodules, sleep apnea, ascites, status post cholecystectomy, history of hepatitis B, history of stroke, vitamin D deficiency, aortic dissection, thrombocytopenia, chronic anemia, hypertension, hyperlipidemia was evaluated by phone today.  Patient was evaluated by phone today because of the coronavirus outbreak.  Patient reports she is anxious about the current situation.  She had an emergency department visit yesterday for chest pain.  She reports she was evaluated for underlying cardiac and lung problems  and was cleared by the ED physician.  She reports she was told that it is more musculoskeletal.  I have reviewed medical records in [facility] per Dr. Reino Kent ED-dated 09/10/2018-' patient's chest pain likely musculoskeletal skeletal, patient's lab did not reveal any acute process and troponin was negative.  Patient's imaging was reassuring.'  Patient today reports she is coping okay.  She does have good social support system at the ALF.  Patient reports sleep is fair.  Patient denies any significant depressive symptoms.  Patient reports she does not have any suicidal thoughts, perceptual disturbances or homicidal thoughts.  Patient could not make it to her appointment with therapist yesterday, discussed with her to call the clinic to reschedule as soon as possible.  She will benefit from more frequent psychotherapy session given the current situational stressors.  Discussed with patient that I have received a letter from her primary provider-Tracy Lampert-FNP with [facility] house [facility].  Per the letter' patient possibly having oversedation, would like to wean her off of her Ambien since she is also on 900 mg of gabapentin at bedtime and hydrocodone/acetaminophen 3 times daily.  She is currently on Atarax ,not  been taking it.  Due to its sedating effects I will discontinue today.  Our goal is to decrease the over sedating effects of her medications.'  This was discussed with patient and patient reports she is okay with the current changes as listed above.   Visit Diagnosis:    ICD-10-CM   1. Bipolar I disorder, most recent episode depressed ([place]) F31.30   2. GAD (generalized anxiety disorder) F41.1   3. Insomnia due to mental disorder F51.05  Past Psychiatric History: I have reviewed past psychiatric history from my progress note on 08/24/2017.  Past trials of Seroquel, trazodone, Ambien  Past Medical History:  Past Medical History:  Diagnosis Date  . Adenomatous colon polyp  08/27/2014   Polyps x 3  . Anemia   . Anxiety   . 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 Arthritis   . Asthma   . Bipolar affective disorder ([place])    h/o  . Blood transfusion 2000  . Blood transfusion without reported diagnosis   . Cancer ([place])   . Chronic abdominal pain    abdominal wall pain  . 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 Colon polyp   . COPD (chronic obstructive pulmonary disease) ([place])   . Depression   . Diabetes mellitus without complication ([place])   . Fatty liver 04/09/08   found in abd CT  . GERD (gastroesophageal reflux disease)   . H/O: CVA (cardiovascular accident)    TIA  . H/O: rheumatic fever   . Hepatitis    HEP "B" twenty years ago  . Hyperlipidemia   . Hypertension   . IBS (irritable bowel syndrome)   . Incontinence   . Insomnia   . Lower extremity edema   . Neuropathy    lower legs D/T DM  . Obstructive sleep apnea    Use C-PAP twice per week  . Platelets decreased ([place])   . RLS (restless legs syndrome)   . Shortness of breath   . Sleep apnea   . Stroke [facility])    TIA- 2002  . Ulcer   . Unspecified disorders of nervous system   . Wears dentures    full upper and lower    Past Surgical History:  Procedure Laterality Date  . ABDOMINAL EXPLORATION SURGERY    . ABDOMINAL HYSTERECTOMY     total  . Axillary artery cannulation via 8-mm Hemashield graft, median sternotomy, extracorporeal circulation with deep hypothermic circulatory arrest, repair of  aortic dessection  01/23/2009   Dr Arlyce Dice  . BACK SURGERY      x 5  . cataract     both eyes  . CHOLECYSTECTOMY    . COLONOSCOPY    . EYE SURGERY Bilateral    Cataract Extraction with IOL  . FACIAL LACERATION REPAIR Left 04/11/2018   Procedure: EARLOBE REPAIR;  Surgeon: Carloyn Manner, MD;  Location: [facility];  Service: ENT;  Laterality: Left;  Diabetic - insulin and oral meds sleep apnea  . HEMORROIDECTOMY     and colon polyp removed  . KNEE ARTHROSCOPY WITH LATERAL RELEASE  Right 11/15/2016   Procedure: KNEE ARTHROSCOPY WITH LATERAL RELEASE;  Surgeon: Corky Mull, MD;  Location: [facility];  Service: Orthopedics;  Laterality: Right;  . LIVER BIOPSY  04/10/2012   Procedure: LIVER BIOPSY;  Surgeon: Inda Castle, MD;  Location: [facility];  Service: Endoscopy;  Laterality: N/A;  ultrasound to mark order for abd limited/liver to be marked to be sent by [email or web address]   . LUMBAR FUSION  10/09  . LUMBAR WOUND DEBRIDEMENT  05/09/2011   Procedure: LUMBAR WOUND DEBRIDEMENT;  Surgeon: Dahlia Bailiff;  Location: [facility];  Service: Orthopedics;  Laterality: N/A;  IRRIGATION AND DEBRIDEMENT SPINAL WOUND  . POSTERIOR CERVICAL FUSION/FORAMINOTOMY    . ROTATOR CUFF REPAIR     left    Family Psychiatric History: I have reviewed family psychiatric history from my progress note on 08/24/2017.  Family History:  Family History  Problem Relation Age of Onset  . Breast cancer Mother   . Lung cancer Mother   . Stomach cancer Father   . Diabetes Other        4 aunts, and 1 uncle  . Depression Brother   . Anxiety disorder Brother   . Colon cancer Neg Hx   . Esophageal cancer Neg Hx   . Rectal cancer Neg Hx     Social History: I have reviewed social history from my progress note on 08/24/2017. Social History   Socioeconomic History  . Marital status: Divorced    Spouse name: Not on file  . Number of children: 2  . Years of education: Not on file  . Highest education level: 8th grade  Occupational History  . Occupation: Disabled    Employer: [employer]  Social Needs  . Financial resource strain: Not hard at all  . Food insecurity:    Worry: Never true    Inability: Never true  . Transportation needs:    Medical: No    Non-medical: No  Tobacco Use  . Smoking status: Former Smoker    Packs/day: 1.50    Years: 50.00    Pack years: 75.00    Types: Cigarettes    Last attempt to quit: 04/06/2013    Years since quitting: 5.4  . Smokeless tobacco: Never Used   Substance and Sexual Activity  . Alcohol use: No    Alcohol/week: 0.0 standard drinks  . Drug use: No  . Sexual activity: Never  Lifestyle  . Physical activity:    Days per week: 0 days    Minutes per session: 0 min  . Stress: Very much  Relationships  . Social connections:    Talks on phone: Not on file    Gets together: Not on file    Attends religious service: Never    Active member of club or organization: No    Attends meetings of clubs or organizations: Never    Relationship status: Divorced  Other Topics Concern  . Not on file  Social History Narrative   1 son, 98+ y/o   Daily caffeine use: 2 cups daily   Does not get regular exercise   Disability due to bipolar      Has a living will- she is a DNR (she has form at home per pt).    Allergies:  Allergies  Allergen Reactions  . Cortisone     Increased BS  . Doxycycline Hives, Swelling, Other (See Comments) and Nausea Only    Reaction:  Facial swelling  Face red and swollen; no difficulty breathing    Metabolic Disorder Labs: Lab Results  Component Value Date   HGBA1C 5.2 07/20/2016   MPG 103 07/20/2016   MPG 131 04/18/2016   No results found for: PROLACTIN Lab Results  Component Value Date   CHOL 194 04/05/2016   TRIG 47 04/05/2016   HDL 110 04/05/2016   CHOLHDL 1.8 04/05/2016   VLDL 9 04/05/2016   LDLCALC 75 04/05/2016   LDLCALC 59 12/15/2014   Lab Results  Component Value Date   TSH 4.486 07/19/2017   TSH 4.24 12/15/2014    Therapeutic Level Labs: No results found for: LITHIUM No results found for: VALPROATE No components found for:  CBMZ  Current Medications: Current Outpatient Medications  Medication Sig Dispense Refill  . albuterol (PROVENTIL HFA;VENTOLIN HFA) 108 (90 Base) MCG/ACT inhaler Inhale 2 puffs into the lungs every 6 (six) hours as needed for  wheezing or shortness of breath.    Marland Kitchen albuterol (PROVENTIL) (2.5 MG/3ML) 0.083% nebulizer solution Take 2.5 mg by nebulization every  6 (six) hours as needed for wheezing or shortness of breath.    . bacitracin 500 UNIT/GM ointment Apply 1 application topically 2 (two) times daily. 15 g 0  . budesonide-formoterol (SYMBICORT) 160-4.5 MCG/ACT inhaler Inhale 2 puffs into the lungs 2 (two) times daily.     Marland Kitchen buPROPion (WELLBUTRIN XL) 150 MG 24 hr tablet Take 1 tablet (150 mg total) by mouth daily. 90 tablet 1  . clonazePAM (KLONOPIN) 0.5 MG tablet Take 0.5 mg by mouth 3 (three) times daily.     . diazepam (VALIUM) 5 MG tablet Take 1 tablet (5 mg total) by mouth every 8 (eight) hours as needed for muscle spasms. 20 tablet 0  . diclofenac sodium (VOLTAREN) 1 % GEL Apply topically 3 (three) times daily.    Marland Kitchen dicyclomine (BENTYL) 10 MG capsule Take 10 mg by mouth 3 (three) times daily.     Marland Kitchen donepezil (ARICEPT) 5 MG tablet     . ferrous sulfate 325 (65 FE) MG tablet Take 325 mg by mouth 2 (two) times daily.     . furosemide (LASIX) 80 MG tablet Take 80 mg by mouth.    . gabapentin (NEURONTIN) 600 MG tablet Take 1 tablet (600 mg total) by mouth 3 (three) times daily. Patient taking differently 90 tablet 2  . hydrochlorothiazide (MICROZIDE) 12.5 MG capsule Take 12.5 mg by mouth daily with supper.    Marland Kitchen HYDROcodone-acetaminophen (NORCO) 7.5-325 MG tablet Take 1 tablet by mouth every 6 (six) hours as needed for moderate pain.    . hydrOXYzine (ATARAX/VISTARIL) 10 MG tablet Take 10 mg by mouth every 8 (eight) hours as needed.    . insulin glargine (LANTUS) 100 UNIT/ML injection Inject 50 Units into the skin every evening.     . insulin lispro (HUMALOG) 100 UNIT/ML injection Inject 8 Units into the skin 3 (three) times daily before meals.     . lactulose (CHRONULAC) 10 GM/15ML solution Take 10 g by mouth daily. (0800)    . lamoTRIgine (LAMICTAL) 100 MG tablet Take 1 tablet (100 mg total) by mouth 2 (two) times daily. (0800 & 2000) 180 tablet 1  . lamoTRIgine (LAMICTAL) 25 MG tablet Take 3 tablets (75 mg total) by mouth as directed. Take 25 mg  with 100 mg lamictal at 8 am and 50 mg  with 100 mg lamictal at 8 pm 270 tablet 0  . lidocaine (LIDODERM) 5 % Place 2 patches onto the skin daily. Apply 1 patch to 2 areas on back (2 patches) every morning and remove at bedtime.    . liraglutide (VICTOZA) 18 MG/3ML SOPN Inject 1.8 mg into the skin daily.    Marland Kitchen LYRICA 150 MG capsule Take 150 mg by mouth 3 (three) times daily. (0800, 1400 & 2000)    . metFORMIN (GLUCOPHAGE-XR) 500 MG 24 hr tablet Take 500 mg by mouth twice daily.    . metoCLOPramide (REGLAN) 5 MG tablet Take 5 mg by mouth 4 (four) times daily -  before meals and at bedtime. (0800, 1200, 1600 & 2000)    . MYRBETRIQ 25 MG TB24 tablet     . naloxone (NARCAN) nasal spray 4 mg/0.1 mL Place 1 spray into the nose.    . nystatin (MYCOSTATIN/NYSTOP) powder Apply topically 4 (four) times daily. (Patient taking differently: Apply topically 4 (four) times daily. Apply to inner thigh/ peri area  twice daily and apply to abdomen 4 times daily.) 15 g 0  . omeprazole (PRILOSEC) 20 MG capsule Take 20 mg by mouth daily.     . ondansetron (ZOFRAN) 4 MG tablet     . oxybutynin (DITROPAN-XL) 5 MG 24 hr tablet Take 5 mg by mouth daily. (0800)    . rOPINIRole (REQUIP) 3 MG tablet Take 3 mg by mouth at bedtime. (2000)    . senna (GERI-KOT) 8.6 MG tablet Take 1 tablet by mouth daily.    . simvastatin (ZOCOR) 10 MG tablet Take 10 mg by mouth at bedtime.    Marland Kitchen spironolactone (ALDACTONE) 50 MG tablet Take 50 mg by mouth 2 (two) times daily. (0800)    . trolamine salicylate (ASPERCREME) 10 % cream Apply 1 application topically 2 (two) times daily as needed for muscle pain.    Marland Kitchen zolpidem (AMBIEN) 10 MG tablet Take 1 tablet (10 mg total) by mouth at bedtime as needed for sleep. 30 tablet 3   No current facility-administered medications for this visit.      Musculoskeletal: Strength & Muscle Tone: UTA Gait & Station: UTA Patient leans: N/A  Psychiatric Specialty Exam: Review of Systems   Psychiatric/Behavioral: The patient is nervous/anxious.   All other systems reviewed and are negative.   There were no vitals taken for this visit.There is no height or weight on file to calculate BMI.  General Appearance: UTA  Eye Contact:  UTA  Speech:  Normal Rate  Volume:  Decreased  Mood:  Anxious  Affect:  UTA  Thought Process:  Goal Directed and Descriptions of Associations: Intact  Orientation:  Full (Time, Place, and Person)  Thought Content: Logical   Suicidal Thoughts:  No  Homicidal Thoughts:  No  Memory:  Immediate;   Fair Recent;   Fair Remote;   Limited  Judgement:  Fair  Insight:  Fair  Psychomotor Activity:  UTA  Concentration:  Concentration: Fair and Attention Span: Fair  Recall:  [employer] of Knowledge: Fair  Language: Fair  Akathisia:  No  Handed:  Right  AIMS (if indicated): UTA  Assets:  Communication Skills Desire for Improvement Social Support  ADL's:  Intact  Cognition: Impaired,  Mild  Sleep:  Fair   Screenings: PHQ2-9     Clinical Support from 01/04/2018 in [place] Clinical Support from 11/16/2017 in [place] Office Visit from 10/18/2017 in [place] Procedure visit from 10/04/2017 in [place] Office Visit from 09/25/2017 in [facility]  PHQ-2 Total Score  0  0  1  0  0       Assessment and Plan: Meshell is a 68 year old Caucasian female who has a history of bipolar disorder, anxiety disorder, insomnia, restless leg syndrome as well as multiple other medical problems as summarized above was evaluated by phone today.  Patient lives at an assisted living facility.  Patient continues to struggle with some anxiety symptoms more so because of the current situational stressors.  However she has been doing overall well.  Discussed  with her to restart psychotherapy sessions with Ms. Peacock and have more frequent sessions given the current situational stressors.  Plan as noted below.  Plan Bipolar disorder- making progress. Lamotrigine 125 mg at 8 AM and 150 mg at 8 PM. Wellbutrin extended release 150 mg p.o. daily  For GAD-improving Discussed with  her to restart psychotherapy sessions with Ms. Peacock, she missed her appointment yesterday. Klonopin as prescribed.  For insomnia-improving Received a letter from primary care provider that Ambien is being currently tapered down.  Agrees with plan.  Patient denies any significant sleep problems.  For tremors- chronic She will continue to work with her neurologist.  For cognitive changes- chronic Aricept 5 mg p.o. nightly.  She will continue to work with her neurologist-Dr. Melrose Nakayama.  Reviewed medical records in [facility] per Dr. Reino Kent ED-dated 09/10/2018 as summarized above.  I have discussed and reviewed letter from her primary medical doctor with doctors making house calls-Tracy Miki Kins, FNP-C-as summarized above-dated 08/21/2018.  Follow-up in clinic in 2 months or sooner if needed.  I have spent atleast 15 minutes non face to face with patient today. More than 50 % of the time was spent for psychoeducation and supportive psychotherapy and care coordination.  This note was generated in part or whole with voice recognition software. Voice recognition is usually quite accurate but there are transcription errors that can and very often do occur. I apologize for any typographical errors that were not detected and corrected.         Ursula Alert, MD 09/11/2018, 11:20 AM

## 2018-09-11 NOTE — Telephone Encounter (Signed)
pt states she needs to speak with dr Shea Evans about the medication changes she made she states that her provider told her something else. and now she is confused.

## 2018-09-11 NOTE — Progress Notes (Signed)
Pt called 10-53 allergies were reviewed with no changes.  Medical and surgical hx reviewed and updated. Pt medications were review with no changes. Vital not done because of phone consult.

## 2018-09-11 NOTE — Patient Instructions (Signed)
Since you are doing OK on current medications, Have not changed any medications today.  Please call to schedule appointment with Ms.Peacock.  Please return to clinic for follow up in 2 months .

## 2018-09-11 NOTE — Telephone Encounter (Signed)
I did not make any medication changes . I did not prescribe her gabapentin - that's why it is striked out. Gabapentin is prescribed by someone else. Thanks . pls let her know

## 2018-09-12 ENCOUNTER — Ambulatory Visit: Payer: Medicare Other | Admitting: Pulmonary Disease

## 2018-09-12 ENCOUNTER — Ambulatory Visit: Payer: Medicare Other | Admitting: Licensed Clinical Social Worker

## 2018-09-13 NOTE — Telephone Encounter (Signed)
[email or web address]  or call  [phone].  tracy lambert from [facility] need to speak with you about this patient. call or send email .

## 2018-09-13 NOTE — Telephone Encounter (Signed)
Returned call to provider - wants conference call with pain provider - will return call to say if April 13 th Monday at noon works.

## 2018-09-20 IMAGING — US US EXTREM LOW VENOUS*R*
1 series · 13 of 24 positions shown · non-contrast
Comparison: None.

CLINICAL DATA: Initial evaluation for acute right leg pain,
swelling. Recent knee surgery.



[Series 1: us extrem low venous*right* · 0.08mm/px · 13 of 46 slices shown]
[im 1/46]
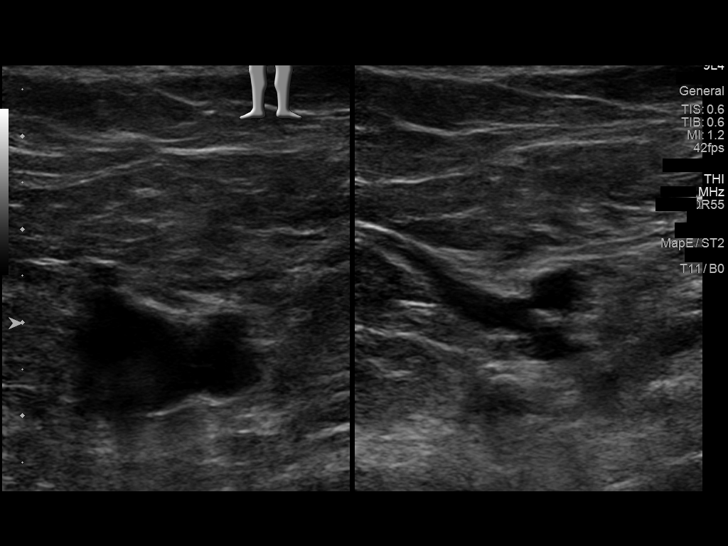
[im 4/46]
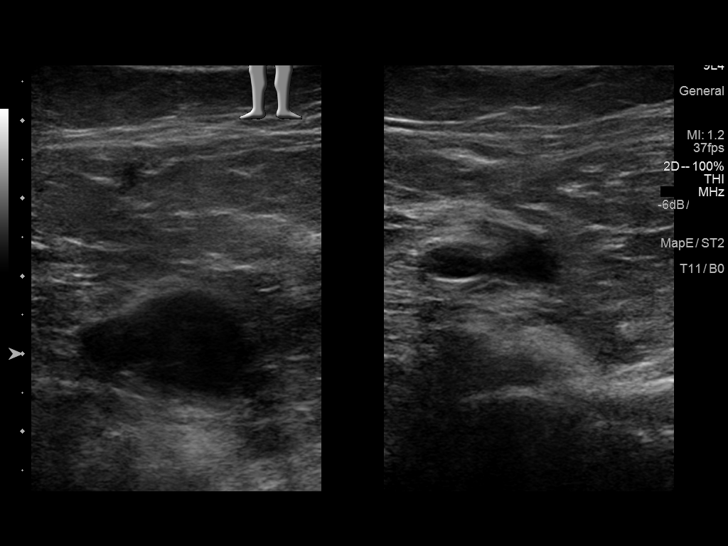
[im 8/46]
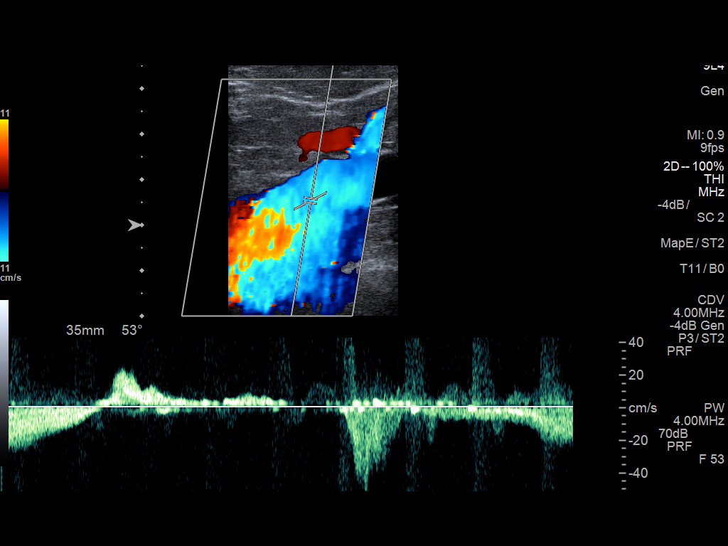
[im 12/46]
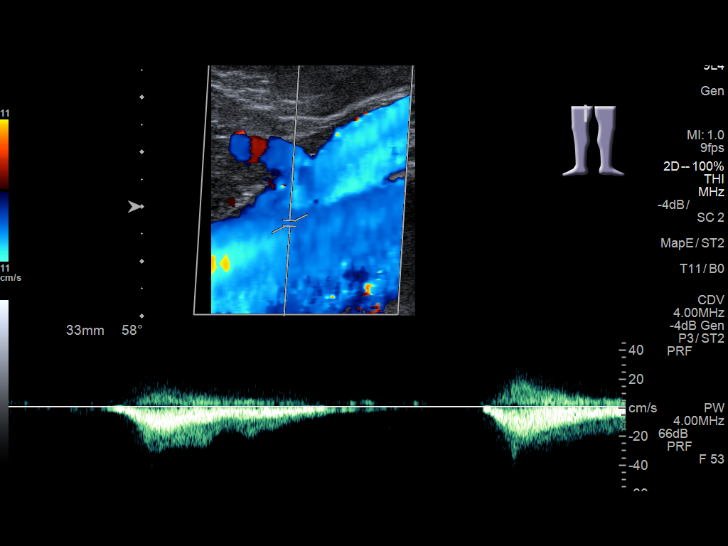
[im 16/46]
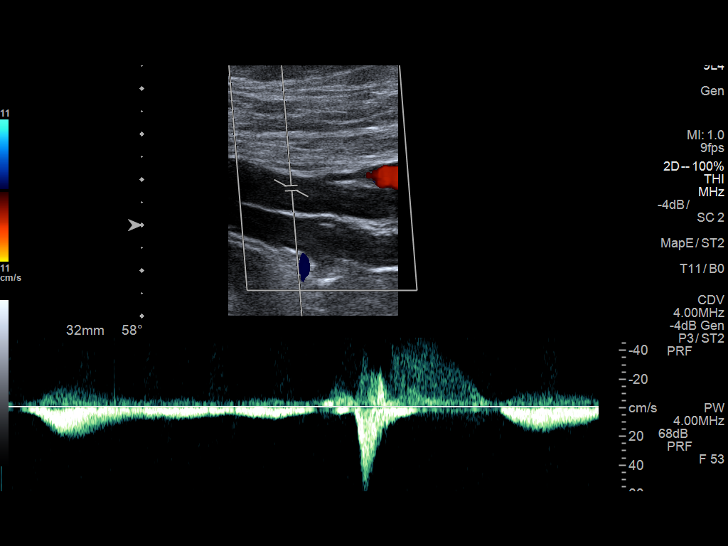
[im 20/46]
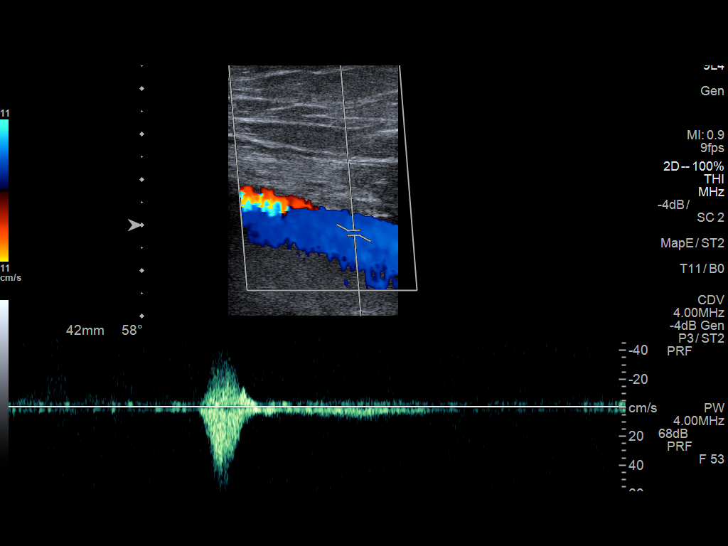
[im 24/46]
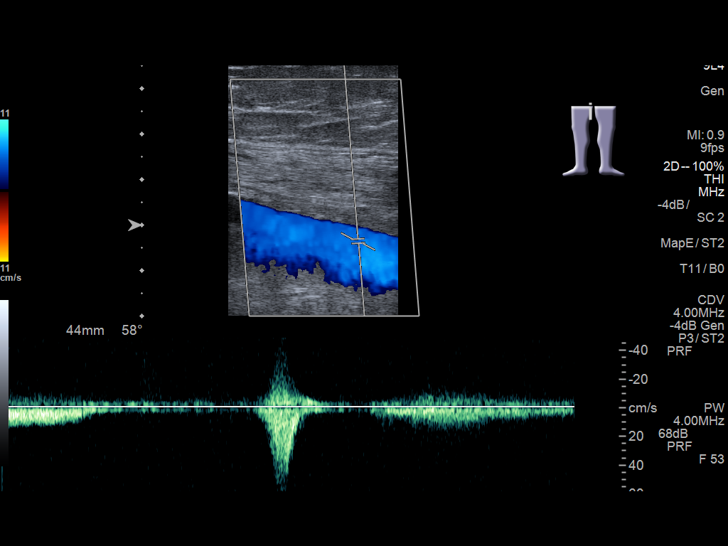
[im 26/46]
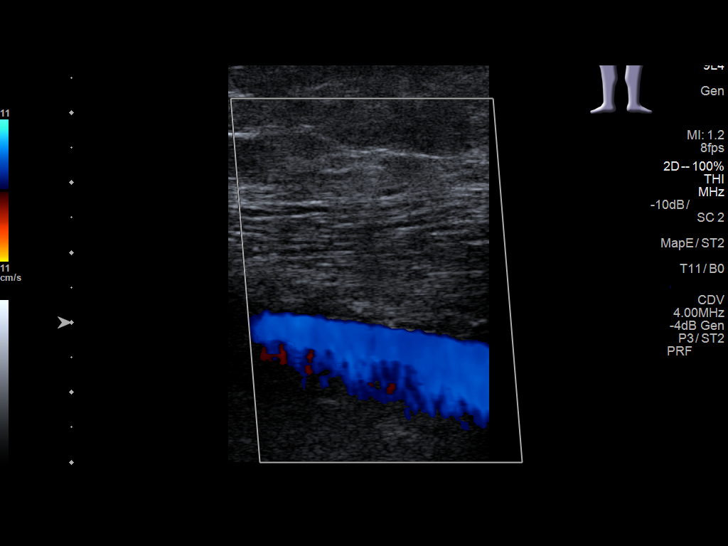
[im 30/46]
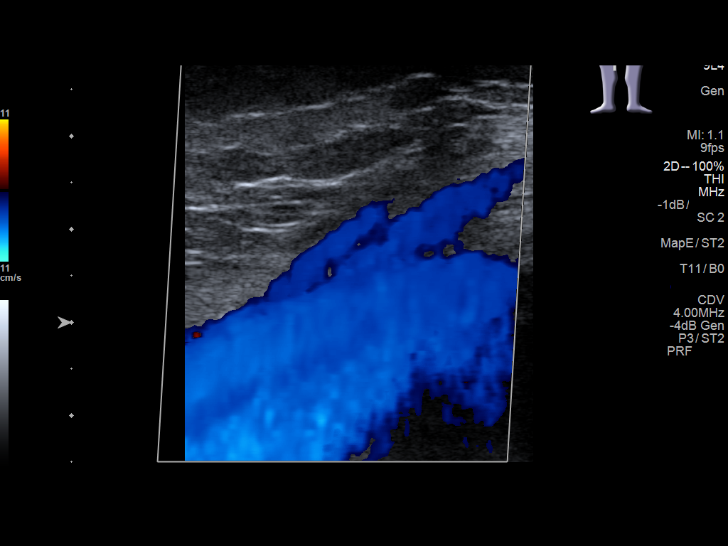
[im 34/46]
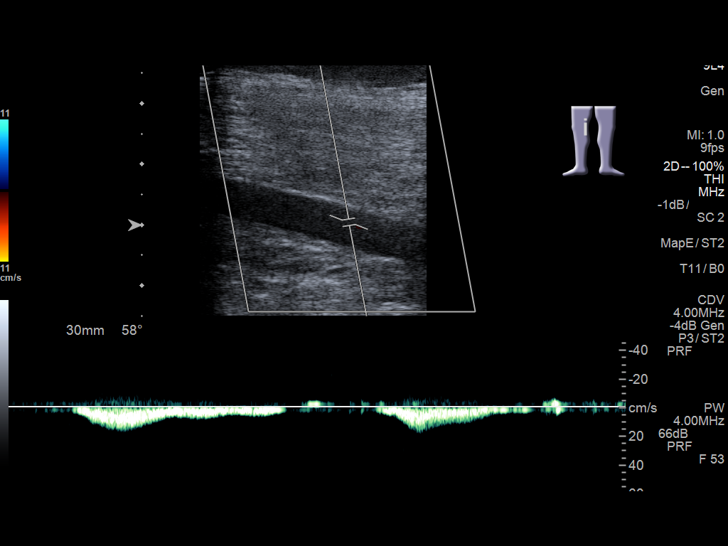
[im 38/46]
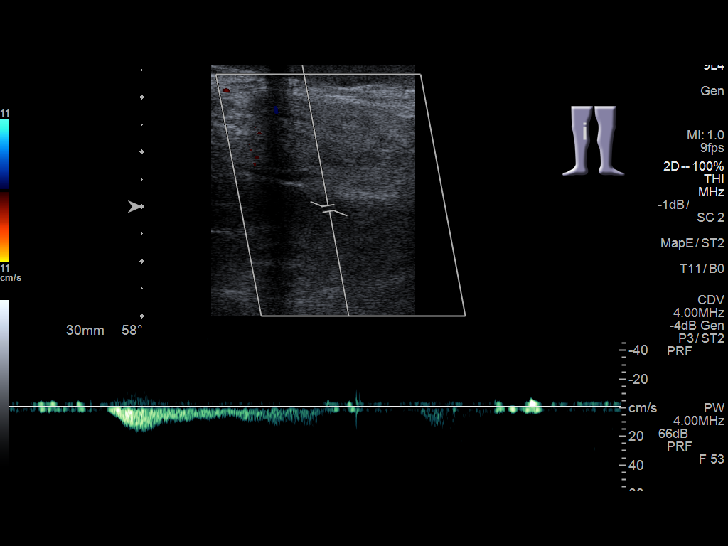
[im 42/46]
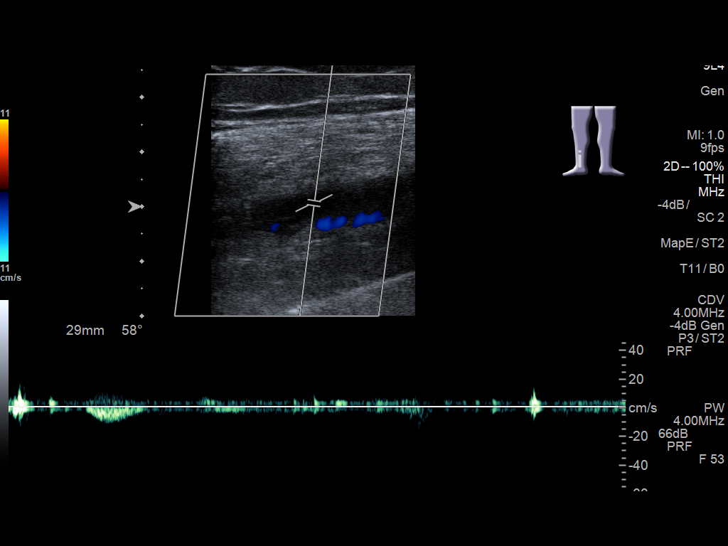
[im 46/46]
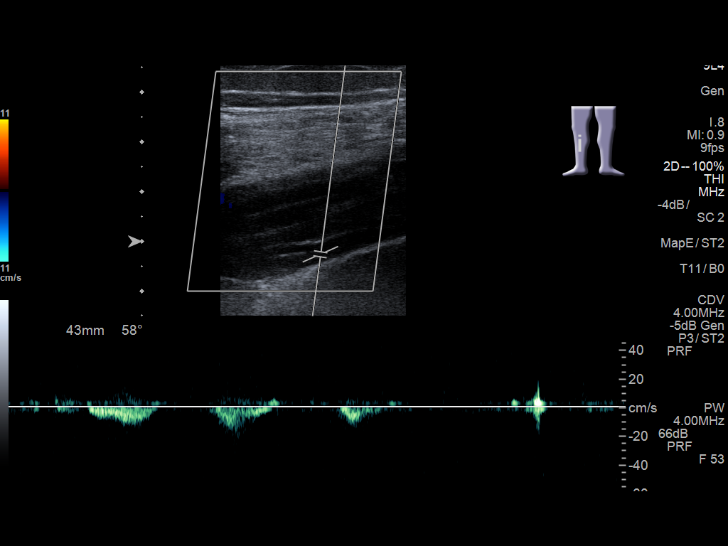

[13 of 24 positions shown; findings below may reference images not displayed]

FINDINGS: Contralateral Common Femoral Vein: Respiratory phasicity is normal
and symmetric with the symptomatic side. No evidence of thrombus.
Normal compressibility.

Common Femoral Vein: No evidence of thrombus. Normal
compressibility, respiratory phasicity and response to augmentation.

Saphenofemoral Junction: No evidence of thrombus. Normal
compressibility and flow on color Doppler imaging.

Profunda Femoral Vein: No evidence of thrombus. Normal
compressibility and flow on color Doppler imaging.

Femoral Vein: No evidence of thrombus. Normal compressibility,
respiratory phasicity and response to augmentation.

Popliteal Vein: No evidence of thrombus. Normal compressibility,
respiratory phasicity and response to augmentation.

Calf Veins: No evidence of thrombus. Normal compressibility and flow
on color Doppler imaging.

Superficial Great Saphenous Vein: No evidence of thrombus. Normal
compressibility and flow on color Doppler imaging.

Venous Reflux:  None.

Other Findings:  None.
IMPRESSION: No evidence of DVT within the right lower extremity.

## 2018-09-25 ENCOUNTER — Ambulatory Visit (INDEPENDENT_AMBULATORY_CARE_PROVIDER_SITE_OTHER): Payer: Medicare Other | Admitting: Licensed Clinical Social Worker

## 2018-09-25 ENCOUNTER — Other Ambulatory Visit: Payer: Self-pay

## 2018-09-25 DIAGNOSIS — F3130 Bipolar disorder, current episode depressed, mild or moderate severity, unspecified: Secondary | ICD-10-CM

## 2018-10-03 DIAGNOSIS — R251 Tremor, unspecified: Secondary | ICD-10-CM | POA: Insufficient documentation

## 2018-10-08 ENCOUNTER — Ambulatory Visit: Payer: Medicare Other | Admitting: Licensed Clinical Social Worker

## 2018-10-08 ENCOUNTER — Other Ambulatory Visit: Payer: Self-pay

## 2018-10-11 ENCOUNTER — Encounter (INDEPENDENT_AMBULATORY_CARE_PROVIDER_SITE_OTHER): Payer: Self-pay

## 2018-10-11 ENCOUNTER — Other Ambulatory Visit: Payer: Self-pay

## 2018-10-11 ENCOUNTER — Inpatient Hospital Stay: Payer: Medicare Other | Attending: Hematology and Oncology

## 2018-10-11 DIAGNOSIS — R918 Other nonspecific abnormal finding of lung field: Secondary | ICD-10-CM | POA: Diagnosis not present

## 2018-10-11 DIAGNOSIS — I890 Lymphedema, not elsewhere classified: Secondary | ICD-10-CM

## 2018-10-11 LAB — CBC WITH DIFFERENTIAL/PLATELET
Abs Immature Granulocytes: 0.02 10*3/uL (ref 0.00–0.07)
Basophils Absolute: 0 10*3/uL (ref 0.0–0.1)
Basophils Relative: 1 %
Eosinophils Absolute: 0.1 10*3/uL (ref 0.0–0.5)
Eosinophils Relative: 2 %
HCT: 36.6 % (ref 36.0–46.0)
Hemoglobin: 12 g/dL (ref 12.0–15.0)
Immature Granulocytes: 0 %
Lymphocytes Relative: 11 %
Lymphs Abs: 0.8 10*3/uL (ref 0.7–4.0)
MCH: 30.2 pg (ref 26.0–34.0)
MCHC: 32.8 g/dL (ref 30.0–36.0)
MCV: 92.2 fL (ref 80.0–100.0)
Monocytes Absolute: 0.7 10*3/uL (ref 0.1–1.0)
Monocytes Relative: 9 %
Neutro Abs: 5.4 10*3/uL (ref 1.7–7.7)
Neutrophils Relative %: 77 %
Platelets: 101 10*3/uL — ABNORMAL LOW (ref 150–400)
RBC: 3.97 MIL/uL (ref 3.87–5.11)
RDW: 16.2 % — ABNORMAL HIGH (ref 11.5–15.5)
WBC: 7 10*3/uL (ref 4.0–10.5)
nRBC: 0 % (ref 0.0–0.2)

## 2018-10-19 NOTE — Progress Notes (Signed)
   THERAPIST PROGRESS NOTE  Session Time: 452mn  Participation Level: Active  Behavioral Response: CasualAlertDepressed  Type of Therapy: Individual Therapy  Treatment Goals addressed: Coping  Interventions: Solution Focused  Summary: SMARELYN ROUSERis a 68y.o. female who presents with continued symptoms of her diagnosis.  Factors that contribute to client's ongoing depressive symptoms were discussed and include real and perceived feelings of isolation, criticism, rejection, shame and guilt.    Suicidal/Homicidal: No  Plan: Return again in 2 weeks.  Diagnosis: Axis I: Bipolar, Depressed    Axis II: No diagnosis    NLubertha South LCSW 09/10/2018

## 2018-10-21 NOTE — Progress Notes (Signed)
No contact.

## 2018-10-25 ENCOUNTER — Telehealth: Payer: Self-pay

## 2018-10-25 NOTE — Telephone Encounter (Signed)
done

## 2018-10-25 NOTE — Telephone Encounter (Signed)
Patients pain clinic called, their doctor would like to set up a time to speak with you. The number to call back is [phone] ext. [place]

## 2018-10-30 ENCOUNTER — Telehealth: Payer: Self-pay | Admitting: Psychiatry

## 2018-10-30 NOTE — Telephone Encounter (Signed)
Had telephone conference with NP Orlean Bradford making House call  ,Dr.Crisp - pain provider. Patient on multiple medications. Discussed tapering off Lyrica . Discussed tapering off of Klonopin. Discussed she is no longer on Ambien. Discussed stopping vistaril. Do not prescribe Benzodiazepines due to drug seeking behavior and being on pain medications. Continue to work with Gabapentin- CURRENT DOSAGE GABAPENTIN 300 MG AT 8 AM, 2PM AND 8 PM. Continue therapy.  NP [facility] can be reached at [phone].

## 2018-11-13 ENCOUNTER — Encounter: Payer: Self-pay | Admitting: Psychiatry

## 2018-11-13 ENCOUNTER — Ambulatory Visit (INDEPENDENT_AMBULATORY_CARE_PROVIDER_SITE_OTHER): Payer: Medicare Other | Admitting: Psychiatry

## 2018-11-13 ENCOUNTER — Other Ambulatory Visit: Payer: Self-pay

## 2018-11-13 DIAGNOSIS — F5105 Insomnia due to other mental disorder: Secondary | ICD-10-CM

## 2018-11-13 DIAGNOSIS — F3130 Bipolar disorder, current episode depressed, mild or moderate severity, unspecified: Secondary | ICD-10-CM | POA: Diagnosis not present

## 2018-11-13 DIAGNOSIS — F411 Generalized anxiety disorder: Secondary | ICD-10-CM

## 2018-11-13 MED ORDER — LAMOTRIGINE 150 MG PO TABS: 150.0000 mg | ORAL_TABLET | Freq: Two times a day (BID) | ORAL | 0 refills | Status: DC

## 2018-11-13 NOTE — Progress Notes (Signed)
Virtual Visit via Telephone Note  I connected with Stephanie [name] on 11/13/18 at 11:00 AM EDT by telephone and verified that I am speaking with the correct person using two identifiers.   I discussed the limitations, risks, security and privacy concerns of performing an evaluation and management service by telephone and the availability of in person appointments. I also discussed with the patient that there may be a patient responsible charge related to this service. The patient expressed understanding and agreed to proceed.   I discussed the assessment and treatment plan with the patient. The patient was provided an opportunity to ask questions and all were answered. The patient agreed with the plan and demonstrated an understanding of the instructions.   The patient was advised to call back or seek an in-person evaluation if the symptoms worsen or if the condition fails to improve as anticipated.   [facility] MD OP Progress Note  11/13/2018 11:57 AM Stephanie [name]  MRN:  [id number]  Chief Complaint:  Chief Complaint    Follow-up     HPI: Stephanie [name] is a 68 year old female, divorced, lives at [facility] assisted living facility on SSD, has a history of bipolar disorder, GAD, RLS, chronic pain, fibromyalgia, insomnia, mild cognitive disorder, COPD, history of lung nodules, sleep apnea, ascites, status post cholecystectomy, history of hepatitis B, history of stroke, vitamin D deficiency, aortic dissection, thrombocytopenia, chronic anemia, hypertension, hyperlipidemia was evaluated by phone today.  Patient preferred to do a phone call.   Patient today reports that she has been feeling depressed.  She has been struggling with some crying spells as well as sadness.  She does have several stressors including the COVID-19 outbreak as well as her own chronic health issues.  Patient reports she also has been struggling with some memory problems.  Patient however was able to give her personal  information including her phone number and date of birth accurately.  She also was able to tell the month, the place and was oriented to the situation.   Collateral information was obtained from staff-Thea as well as med tech - [place] - ' Per staff patient has been compliant on medications.'  Discussed with patient her lamotrigine can be readjusted today to address her mood symptoms.  She agrees with plan.  Discussed with patient to continue to work with her pain provider, she is currently being tapered down from the Lyrica.  She is on gabapentin which will be managed by writer per discussion with her pain provider.  She currently takes 300 mg 3 times a day.  Patient denies any suicidality, homicidality or perceptual disturbances. Visit Diagnosis:    ICD-10-CM   1. Bipolar I disorder, most recent episode depressed (HCC) F31.30 lamoTRIgine (LAMICTAL) 150 MG tablet  2. GAD (generalized anxiety disorder) F41.1   3. Insomnia due to mental disorder F51.05     Past Psychiatric History: I have reviewed past psychiatric history from my progress note on 12/23/2017.  Past trials of Seroquel, trazodone, Ambien.  Past Medical History:  Past Medical History:  Diagnosis Date  . Adenomatous colon polyp 08/27/2014   Polyps x 3  . Anemia   . Anxiety   . 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 Arthritis   . Asthma   . Bipolar affective disorder ([place])    h/o  . Blood transfusion 2000  . Blood transfusion without reported diagnosis   . Cancer ([place])   . Chronic abdominal pain  abdominal wall pain  . 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 Colon polyp   . COPD (chronic obstructive pulmonary disease) ([place])   . Depression   . Diabetes mellitus without complication ([place])   . Fatty liver 04/09/08   found in abd CT  . GERD (gastroesophageal reflux disease)   . H/O: CVA (cardiovascular accident)    TIA  . H/O: rheumatic fever   . Hepatitis    HEP "B" twenty years ago  . Hyperlipidemia   .  Hypertension   . IBS (irritable bowel syndrome)   . Incontinence   . Insomnia   . Lower extremity edema   . Neuropathy    lower legs D/T DM  . Obstructive sleep apnea    Use C-PAP twice per week  . Platelets decreased ([place])   . RLS (restless legs syndrome)   . Shortness of breath   . Sleep apnea   . Stroke [facility])    TIA- 2002  . Ulcer   . Unspecified disorders of nervous system   . Wears dentures    full upper and lower    Past Surgical History:  Procedure Laterality Date  . ABDOMINAL EXPLORATION SURGERY    . ABDOMINAL HYSTERECTOMY     total  . Axillary artery cannulation via 8-mm Hemashield graft, median sternotomy, extracorporeal circulation with deep hypothermic circulatory arrest, repair of  aortic dessection  01/23/2009   Dr Arlyce Dice  . BACK SURGERY      x 5  . cataract     both eyes  . CHOLECYSTECTOMY    . COLONOSCOPY    . EYE SURGERY Bilateral    Cataract Extraction with IOL  . FACIAL LACERATION REPAIR Left 04/11/2018   Procedure: EARLOBE REPAIR;  Surgeon: Carloyn Manner, MD;  Location: [facility];  Service: ENT;  Laterality: Left;  Diabetic - insulin and oral meds sleep apnea  . HEMORROIDECTOMY     and colon polyp removed  . KNEE ARTHROSCOPY WITH LATERAL RELEASE Right 11/15/2016   Procedure: KNEE ARTHROSCOPY WITH LATERAL RELEASE;  Surgeon: Corky Mull, MD;  Location: [facility];  Service: Orthopedics;  Laterality: Right;  . LIVER BIOPSY  04/10/2012   Procedure: LIVER BIOPSY;  Surgeon: Inda Castle, MD;  Location: [facility];  Service: Endoscopy;  Laterality: N/A;  ultrasound to mark order for abd limited/liver to be marked to be sent by [email or web address]   . LUMBAR FUSION  10/09  . LUMBAR WOUND DEBRIDEMENT  05/09/2011   Procedure: LUMBAR WOUND DEBRIDEMENT;  Surgeon: Dahlia Bailiff;  Location: [facility];  Service: Orthopedics;  Laterality: N/A;  IRRIGATION AND DEBRIDEMENT SPINAL WOUND  . POSTERIOR CERVICAL FUSION/FORAMINOTOMY    . ROTATOR CUFF REPAIR     left     Family Psychiatric History: Have reviewed family psychiatric history from my progress note on 08/24/2017.  Family History:  Family History  Problem Relation Age of Onset  . Breast cancer Mother   . Lung cancer Mother   . Stomach cancer Father   . Diabetes Other        4 aunts, and 1 uncle  . Depression Brother   . Anxiety disorder Brother   . Colon cancer Neg Hx   . Esophageal cancer Neg Hx   . Rectal cancer Neg Hx     Social History: Reviewed social history from my progress note on 08/24/2017. Social History   Socioeconomic History  . Marital status: Divorced    Spouse name: Not on file  .  Number of children: 2  . Years of education: Not on file  . Highest education level: 8th grade  Occupational History  . Occupation: Disabled    Employer: [employer]  Social Needs  . Financial resource strain: Not hard at all  . Food insecurity:    Worry: Never true    Inability: Never true  . Transportation needs:    Medical: No    Non-medical: No  Tobacco Use  . Smoking status: Former Smoker    Packs/day: 1.50    Years: 50.00    Pack years: 75.00    Types: Cigarettes    Last attempt to quit: 04/06/2013    Years since quitting: 5.6  . Smokeless tobacco: Never Used  Substance and Sexual Activity  . Alcohol use: No    Alcohol/week: 0.0 standard drinks  . Drug use: No  . Sexual activity: Never  Lifestyle  . Physical activity:    Days per week: 0 days    Minutes per session: 0 min  . Stress: Very much  Relationships  . Social connections:    Talks on phone: Not on file    Gets together: Not on file    Attends religious service: Never    Active member of club or organization: No    Attends meetings of clubs or organizations: Never    Relationship status: Divorced  Other Topics Concern  . Not on file  Social History Narrative   1 son, 29+ y/o   Daily caffeine use: 2 cups daily   Does not get regular exercise   Disability due to bipolar      Has a living will-  she is a DNR (she has form at home per pt).    Allergies:  Allergies  Allergen Reactions  . Cortisone     Increased BS  . Doxycycline Hives, Swelling, Other (See Comments) and Nausea Only    Reaction:  Facial swelling  Face red and swollen; no difficulty breathing    Metabolic Disorder Labs: Lab Results  Component Value Date   HGBA1C 5.2 07/20/2016   MPG 103 07/20/2016   MPG 131 04/18/2016   No results found for: PROLACTIN Lab Results  Component Value Date   CHOL 194 04/05/2016   TRIG 47 04/05/2016   HDL 110 04/05/2016   CHOLHDL 1.8 04/05/2016   VLDL 9 04/05/2016   LDLCALC 75 04/05/2016   LDLCALC 59 12/15/2014   Lab Results  Component Value Date   TSH 4.486 07/19/2017   TSH 4.24 12/15/2014    Therapeutic Level Labs: No results found for: LITHIUM No results found for: VALPROATE No components found for:  CBMZ  Current Medications: Current Outpatient Medications  Medication Sig Dispense Refill  . memantine (NAMENDA) 5 MG tablet Take by mouth.    Marland Kitchen albuterol (PROVENTIL HFA;VENTOLIN HFA) 108 (90 Base) MCG/ACT inhaler Inhale 2 puffs into the lungs every 6 (six) hours as needed for wheezing or shortness of breath.    Marland Kitchen albuterol (PROVENTIL) (2.5 MG/3ML) 0.083% nebulizer solution Take 2.5 mg by nebulization every 6 (six) hours as needed for wheezing or shortness of breath.    . bacitracin 500 UNIT/GM ointment Apply 1 application topically 2 (two) times daily. 15 g 0  . budesonide-formoterol (SYMBICORT) 160-4.5 MCG/ACT inhaler Inhale 2 puffs into the lungs 2 (two) times daily.     Marland Kitchen buPROPion (WELLBUTRIN XL) 150 MG 24 hr tablet Take 1 tablet (150 mg total) by mouth daily. 90 tablet 1  . diclofenac sodium (VOLTAREN)  1 % GEL Apply topically 3 (three) times daily.    Marland Kitchen dicyclomine (BENTYL) 10 MG capsule Take 10 mg by mouth 3 (three) times daily.     Marland Kitchen donepezil (ARICEPT) 10 MG tablet Take by mouth.    . donepezil (ARICEPT) 5 MG tablet     . ferrous sulfate 325 (65 FE) MG  tablet Take 325 mg by mouth 2 (two) times daily.     . furosemide (LASIX) 80 MG tablet Take 80 mg by mouth.    . gabapentin (NEURONTIN) 300 MG capsule 300 mg. Takes 300 mg at 8 am , 2pm, 8 pm.    . Glucagon 3 MG/DOSE POWD as directed    . hydrochlorothiazide (MICROZIDE) 12.5 MG capsule Take 12.5 mg by mouth daily with supper.    Marland Kitchen HYDROcodone-acetaminophen (NORCO) 7.5-325 MG tablet Take 1 tablet by mouth every 6 (six) hours as needed for moderate pain.    Marland Kitchen ibuprofen (ADVIL) 600 MG tablet 1 tablet with food or milk as needed    . insulin glargine (LANTUS) 100 UNIT/ML injection Inject 50 Units into the skin every evening.     . insulin lispro (HUMALOG) 100 UNIT/ML injection Inject 8 Units into the skin 3 (three) times daily before meals.     . lactulose (CHRONULAC) 10 GM/15ML solution Take 10 g by mouth daily. (0800)    . lamoTRIgine (LAMICTAL) 150 MG tablet Take 1 tablet (150 mg total) by mouth 2 (two) times daily. 180 tablet 0  . lidocaine (LIDODERM) 5 % Place 2 patches onto the skin daily. Apply 1 patch to 2 areas on back (2 patches) every morning and remove at bedtime.    . liraglutide (VICTOZA) 18 MG/3ML SOPN Inject 1.8 mg into the skin daily.    Marland Kitchen LYRICA 150 MG capsule Take 150 mg by mouth 3 (three) times daily. (0800, 1400 & 2000)    . metFORMIN (GLUCOPHAGE-XR) 500 MG 24 hr tablet Take 500 mg by mouth twice daily.    . metoCLOPramide (REGLAN) 5 MG tablet Take 5 mg by mouth 4 (four) times daily -  before meals and at bedtime. (0800, 1200, 1600 & 2000)    . MYRBETRIQ 25 MG TB24 tablet     . naloxone (NARCAN) nasal spray 4 mg/0.1 mL Place 1 spray into the nose.    . nystatin (MYCOSTATIN/NYSTOP) powder Apply topically 4 (four) times daily. (Patient taking differently: Apply topically 4 (four) times daily. Apply to inner thigh/ peri area twice daily and apply to abdomen 4 times daily.) 15 g 0  . omeprazole (PRILOSEC) 20 MG capsule Take 20 mg by mouth daily.     . ondansetron (ZOFRAN) 4 MG tablet      . oxybutynin (DITROPAN-XL) 5 MG 24 hr tablet Take 5 mg by mouth daily. (0800)    . pregabalin (LYRICA) 75 MG capsule     . rOPINIRole (REQUIP) 3 MG tablet Take 3 mg by mouth at bedtime. (2000)    . senna (GERI-KOT) 8.6 MG tablet Take 1 tablet by mouth daily.    . simvastatin (ZOCOR) 10 MG tablet Take 10 mg by mouth at bedtime.    Marland Kitchen spironolactone (ALDACTONE) 50 MG tablet Take 50 mg by mouth 2 (two) times daily. (0800)    . trolamine salicylate (ASPERCREME) 10 % cream Apply 1 application topically 2 (two) times daily as needed for muscle pain.     No current facility-administered medications for this visit.      Musculoskeletal: Strength & Muscle  Tone: [place]: UTA Patient leans: N/A  Psychiatric Specialty Exam: Review of Systems  Musculoskeletal: Positive for joint pain.  Psychiatric/Behavioral: Positive for depression.  All other systems reviewed and are negative.   There were no vitals taken for this visit.There is no height or weight on file to calculate BMI.  General Appearance: UTA  Eye Contact:  UTA  Speech:  Clear and Coherent  Volume:  Normal  Mood:  Depressed  Affect:  UTA  Thought Process:  Goal Directed and Descriptions of Associations: Intact  Orientation:  Other:  place, situation and month  Thought Content: Logical   Suicidal Thoughts:  No  Homicidal Thoughts:  No  Memory:  Immediate;   Fair Recent;   Fair Remote;   Fair  Judgement:  Fair  Insight:  Fair  Psychomotor Activity:  UTA  Concentration:  Concentration: Fair and Attention Span: Fair  Recall:  [employer] of Knowledge: Fair  Language: Fair  Akathisia:  No  Handed:  Right  AIMS (if indicated): Denies tremors, rigidity, stiffness  Assets:  Communication Skills Desire for Improvement Housing Social Support  ADL's:  Intact  Cognition: WNL  Sleep:  Fair   Screenings: PHQ2-9     Clinical Support from 01/04/2018 in [place] Clinical  Support from 11/16/2017 in [place] Office Visit from 10/18/2017 in [place] Procedure visit from 10/04/2017 in [place] Office Visit from 09/25/2017 in [place]  PHQ-2 Total Score  0  0  1  0  0       Assessment and Plan: Stephanie [name] is a 25 year old patient female who has a history of bipolar disorder, anxiety disorder, insomnia, restless leg syndrome as well as multiple medical problems as summarized above was evaluated by phone today.  Patient lives at an assisted living facility.  Patient continues to struggle with mood symptoms and will benefit from medication readjustment as well as more frequent psychotherapy sessions.  Collateral information was obtained from staff as noted above.  Plan as noted below.    Bipolar disorder- some improvement Increase lamotrigine to 150 mg p.o. twice daily. Wellbutrin extended release 150 mg p.o. daily  For GAD-some progress Continue gabapentin 300 mg p.o. daily at 8 AM, 2 PM and 8 PM. Patient is currently being tapered off of the Klonopin by her primary provider. Discussed with patient as well as staff to contact the office to make an appointment with Ms. Peacock-for psychotherapy sessions.  For insomnia-improving Ambien was discontinued after discussion with her pain provider. She reports sleep is okay at this time.  For tremors-chronic She will continue to work with her neurologist.  For cognitive problems-chronic Memantine 5 mg p.o. daily. She will continue to work with her neurologist.  Collateral information was obtained from staff-the as well as [employer] as summarized above.  Follow-up in clinic in 4 to 5 weeks or sooner if needed.  July 28 at 10:45 AM.  And a video called can be done that day by calling [phone].  I have spent atleast 15 minutes non  face to face with patient today. More than 50 % of the time was spent for psychoeducation and supportive psychotherapy and care coordination.   This note was generated in part or whole with voice recognition software. Voice recognition is usually quite accurate but there are transcription errors that  can and very often do occur. I apologize for any typographical errors that were not detected and corrected.      Ursula Alert, MD 11/13/2018, 11:57 AM

## 2018-11-20 ENCOUNTER — Emergency Department: Payer: Medicare Other

## 2018-11-20 ENCOUNTER — Other Ambulatory Visit: Payer: Self-pay

## 2018-11-20 ENCOUNTER — Emergency Department
Admission: EM | Admit: 2018-11-20 | Discharge: 2018-11-21 | Disposition: A | Discharge status: Other Acute Inpatient Hospital | Payer: Medicare Other | Attending: Student in an Organized Health Care Education/Training Program | Admitting: Student in an Organized Health Care Education/Training Program

## 2018-11-20 ENCOUNTER — Encounter: Payer: Self-pay | Admitting: Emergency Medicine

## 2018-11-20 DIAGNOSIS — Z87891 Personal history of nicotine dependence: Secondary | ICD-10-CM | POA: Insufficient documentation

## 2018-11-20 DIAGNOSIS — E118 Type 2 diabetes mellitus with unspecified complications: Secondary | ICD-10-CM

## 2018-11-20 DIAGNOSIS — J45909 Unspecified asthma, uncomplicated: Secondary | ICD-10-CM | POA: Insufficient documentation

## 2018-11-20 DIAGNOSIS — G4700 Insomnia, unspecified: Secondary | ICD-10-CM

## 2018-11-20 DIAGNOSIS — F319 Bipolar disorder, unspecified: Secondary | ICD-10-CM | POA: Diagnosis not present

## 2018-11-20 DIAGNOSIS — Z79899 Other long term (current) drug therapy: Secondary | ICD-10-CM | POA: Diagnosis not present

## 2018-11-20 DIAGNOSIS — E109 Type 1 diabetes mellitus without complications: Secondary | ICD-10-CM | POA: Diagnosis not present

## 2018-11-20 DIAGNOSIS — I110 Hypertensive heart disease with heart failure: Secondary | ICD-10-CM | POA: Insufficient documentation

## 2018-11-20 DIAGNOSIS — J449 Chronic obstructive pulmonary disease, unspecified: Secondary | ICD-10-CM | POA: Insufficient documentation

## 2018-11-20 DIAGNOSIS — F413 Other mixed anxiety disorders: Secondary | ICD-10-CM

## 2018-11-20 DIAGNOSIS — Z119 Encounter for screening for infectious and parasitic diseases, unspecified: Secondary | ICD-10-CM

## 2018-11-20 DIAGNOSIS — F17200 Nicotine dependence, unspecified, uncomplicated: Secondary | ICD-10-CM

## 2018-11-20 DIAGNOSIS — Z794 Long term (current) use of insulin: Secondary | ICD-10-CM | POA: Diagnosis not present

## 2018-11-20 DIAGNOSIS — Z03818 Encounter for observation for suspected exposure to other biological agents ruled out: Secondary | ICD-10-CM | POA: Diagnosis not present

## 2018-11-20 DIAGNOSIS — M4802 Spinal stenosis, cervical region: Secondary | ICD-10-CM | POA: Diagnosis not present

## 2018-11-20 DIAGNOSIS — M545 Low back pain, unspecified: Secondary | ICD-10-CM | POA: Diagnosis present

## 2018-11-20 DIAGNOSIS — G629 Polyneuropathy, unspecified: Secondary | ICD-10-CM

## 2018-11-20 DIAGNOSIS — K219 Gastro-esophageal reflux disease without esophagitis: Secondary | ICD-10-CM

## 2018-11-20 DIAGNOSIS — F3112 Bipolar disorder, current episode manic without psychotic features, moderate: Secondary | ICD-10-CM

## 2018-11-20 DIAGNOSIS — E785 Hyperlipidemia, unspecified: Secondary | ICD-10-CM

## 2018-11-20 DIAGNOSIS — I10 Essential (primary) hypertension: Secondary | ICD-10-CM

## 2018-11-20 DIAGNOSIS — K581 Irritable bowel syndrome with constipation: Secondary | ICD-10-CM

## 2018-11-20 DIAGNOSIS — Z8673 Personal history of transient ischemic attack (TIA), and cerebral infarction without residual deficits: Secondary | ICD-10-CM | POA: Diagnosis not present

## 2018-11-20 DIAGNOSIS — G4733 Obstructive sleep apnea (adult) (pediatric): Secondary | ICD-10-CM

## 2018-11-20 DIAGNOSIS — I5032 Chronic diastolic (congestive) heart failure: Secondary | ICD-10-CM | POA: Diagnosis present

## 2018-11-20 DIAGNOSIS — E1165 Type 2 diabetes mellitus with hyperglycemia: Secondary | ICD-10-CM

## 2018-11-20 DIAGNOSIS — R462 Strange and inexplicable behavior: Secondary | ICD-10-CM | POA: Diagnosis not present

## 2018-11-20 DIAGNOSIS — G2581 Restless legs syndrome: Secondary | ICD-10-CM

## 2018-11-20 DIAGNOSIS — R413 Other amnesia: Secondary | ICD-10-CM | POA: Diagnosis present

## 2018-11-20 DIAGNOSIS — F411 Generalized anxiety disorder: Secondary | ICD-10-CM | POA: Diagnosis present

## 2018-11-20 DIAGNOSIS — G8929 Other chronic pain: Secondary | ICD-10-CM

## 2018-11-20 DIAGNOSIS — G894 Chronic pain syndrome: Secondary | ICD-10-CM

## 2018-11-20 DIAGNOSIS — F5105 Insomnia due to other mental disorder: Secondary | ICD-10-CM | POA: Diagnosis present

## 2018-11-20 LAB — CBC
HCT: 35.5 % — ABNORMAL LOW (ref 36.0–46.0)
Hemoglobin: 11.7 g/dL — ABNORMAL LOW (ref 12.0–15.0)
MCH: 30.6 pg (ref 26.0–34.0)
MCHC: 33 g/dL (ref 30.0–36.0)
MCV: 92.9 fL (ref 80.0–100.0)
Platelets: 102 10*3/uL — ABNORMAL LOW (ref 150–400)
RBC: 3.82 MIL/uL — ABNORMAL LOW (ref 3.87–5.11)
RDW: 16.4 % — ABNORMAL HIGH (ref 11.5–15.5)
WBC: 7.3 10*3/uL (ref 4.0–10.5)
nRBC: 0 % (ref 0.0–0.2)

## 2018-11-20 LAB — ACETAMINOPHEN LEVEL: Acetaminophen (Tylenol), Serum: <10 ug/mL — ABNORMAL LOW (ref 10–30)

## 2018-11-20 LAB — URINALYSIS, ROUTINE W REFLEX MICROSCOPIC
Bilirubin Urine: NEGATIVE
Glucose, UA: 50 mg/dL — AB
Hgb urine dipstick: NEGATIVE
Ketones, ur: NEGATIVE mg/dL
Leukocytes,Ua: NEGATIVE
Nitrite: NEGATIVE
Protein, ur: NEGATIVE mg/dL
Specific Gravity, Urine: 1.006 (ref 1.005–1.030)
pH: 7 (ref 5.0–8.0)

## 2018-11-20 LAB — TSH: TSH: 1.583 u[IU]/mL (ref 0.350–4.500)

## 2018-11-20 LAB — URINE DRUG SCREEN, QUALITATIVE (ARMC ONLY)
Amphetamines, Ur Screen: NOT DETECTED
Barbiturates, Ur Screen: NOT DETECTED
Benzodiazepine, Ur Scrn: NOT DETECTED
Cannabinoid 50 Ng, Ur ~~LOC~~: NOT DETECTED
Cocaine Metabolite,Ur ~~LOC~~: NOT DETECTED
MDMA (Ecstasy)Ur Screen: NOT DETECTED
Methadone Scn, Ur: NOT DETECTED
Opiate, Ur Screen: POSITIVE — AB
Phencyclidine (PCP) Ur S: NOT DETECTED
Tricyclic, Ur Screen: NOT DETECTED

## 2018-11-20 LAB — LIPID PANEL
Cholesterol: 181 mg/dL (ref 0–200)
HDL: 97 mg/dL (ref >40)
LDL Cholesterol: 72 mg/dL (ref 0–99)
Total CHOL/HDL Ratio: 1.9 RATIO
Triglycerides: 59 mg/dL (ref <150)
VLDL: 12 mg/dL (ref 0–40)

## 2018-11-20 LAB — COMPREHENSIVE METABOLIC PANEL
ALT: 16 U/L (ref 0–44)
AST: 22 U/L (ref 15–41)
Albumin: 3.9 g/dL (ref 3.5–5.0)
Alkaline Phosphatase: 164 U/L — ABNORMAL HIGH (ref 38–126)
Anion gap: 13 (ref 5–15)
BUN: 13 mg/dL (ref 8–23)
CO2: 23 mmol/L (ref 22–32)
Calcium: 9 mg/dL (ref 8.9–10.3)
Chloride: 102 mmol/L (ref 98–111)
Creatinine, Ser: 1.16 mg/dL — ABNORMAL HIGH (ref 0.44–1.00)
GFR calc Af Amer: 56 mL/min — ABNORMAL LOW (ref >60)
GFR calc non Af Amer: 48 mL/min — ABNORMAL LOW (ref >60)
Glucose, Bld: 198 mg/dL — ABNORMAL HIGH (ref 70–99)
Potassium: 4.2 mmol/L (ref 3.5–5.1)
Sodium: 138 mmol/L (ref 135–145)
Total Bilirubin: 0.5 mg/dL (ref 0.3–1.2)
Total Protein: 7.1 g/dL (ref 6.5–8.1)

## 2018-11-20 LAB — HEMOGLOBIN A1C
Hgb A1c MFr Bld: 5.6 % (ref 4.8–5.6)
Mean Plasma Glucose: 114.02 mg/dL

## 2018-11-20 LAB — GLUCOSE, CAPILLARY
Glucose-Capillary: 140 mg/dL — ABNORMAL HIGH (ref 70–99)
Glucose-Capillary: 141 mg/dL — ABNORMAL HIGH (ref 70–99)
Glucose-Capillary: 122 mg/dL — ABNORMAL HIGH (ref 70–99)

## 2018-11-20 LAB — SARS CORONAVIRUS 2 BY RT PCR (HOSPITAL ORDER, PERFORMED IN ~~LOC~~ HOSPITAL LAB): SARS Coronavirus 2: NEGATIVE

## 2018-11-20 LAB — ETHANOL: Alcohol, Ethyl (B): <10 mg/dL (ref <10)

## 2018-11-20 LAB — SALICYLATE LEVEL: Salicylate Lvl: <7 mg/dL (ref 2.8–30.0)

## 2018-11-20 MED ORDER — PREGABALIN 75 MG PO CAPS
75.0000 mg | ORAL_CAPSULE | Freq: Three times a day (TID) | ORAL | Status: DC
  Administered 2018-11-20 – 2018-11-21 (×2): 75 mg via ORAL
  Filled 2018-11-20 (×3): qty 1

## 2018-11-20 MED ORDER — IBUPROFEN 600 MG PO TABS
600.0000 mg | ORAL_TABLET | Freq: Four times a day (QID) | ORAL | Status: DC | PRN
  Administered 2018-11-20 – 2018-11-21 (×4): 600 mg via ORAL
  Filled 2018-11-20 (×4): qty 1

## 2018-11-20 MED ORDER — MOMETASONE FURO-FORMOTEROL FUM 200-5 MCG/ACT IN AERO
2.0000 | INHALATION_SPRAY | Freq: Two times a day (BID) | RESPIRATORY_TRACT | Status: DC
  Administered 2018-11-20 – 2018-11-21 (×2): 2 via RESPIRATORY_TRACT
  Filled 2018-11-20: qty 8.8

## 2018-11-20 MED ORDER — GABAPENTIN 400 MG PO CAPS
400.0000 mg | ORAL_CAPSULE | Freq: Every day | ORAL | Status: DC
  Administered 2018-11-20: 400 mg via ORAL
  Filled 2018-11-20 (×2): qty 1

## 2018-11-20 MED ORDER — FUROSEMIDE 40 MG PO TABS
80.0000 mg | ORAL_TABLET | Freq: Every day | ORAL | Status: DC
  Administered 2018-11-21: 80 mg via ORAL
  Filled 2018-11-20: qty 2

## 2018-11-20 MED ORDER — DONEPEZIL HCL 5 MG PO TABS
10.0000 mg | ORAL_TABLET | Freq: Every day | ORAL | Status: DC
  Administered 2018-11-20: 10 mg via ORAL
  Filled 2018-11-20: qty 2

## 2018-11-20 MED ORDER — INSULIN ASPART 100 UNIT/ML ~~LOC~~ SOLN
8.0000 [IU] | Freq: Once | SUBCUTANEOUS | Status: AC
  Administered 2018-11-20: 8 [IU] via SUBCUTANEOUS
  Filled 2018-11-20: qty 1

## 2018-11-20 MED ORDER — ONDANSETRON HCL 4 MG PO TABS: 4.0000 mg | ORAL_TABLET | Freq: Three times a day (TID) | ORAL | Status: DC | PRN

## 2018-11-20 MED ORDER — DICYCLOMINE HCL 10 MG PO CAPS
10.0000 mg | ORAL_CAPSULE | Freq: Three times a day (TID) | ORAL | Status: DC
  Administered 2018-11-20 – 2018-11-21 (×3): 10 mg via ORAL
  Filled 2018-11-20 (×3): qty 1

## 2018-11-20 MED ORDER — LIRAGLUTIDE 18 MG/3ML ~~LOC~~ SOPN: 1.8000 mg | PEN_INJECTOR | Freq: Every day | SUBCUTANEOUS | Status: DC

## 2018-11-20 MED ORDER — HYDROCODONE-ACETAMINOPHEN 7.5-325 MG PO TABS
1.0000 | ORAL_TABLET | Freq: Four times a day (QID) | ORAL | Status: DC | PRN
  Filled 2018-11-20: qty 1

## 2018-11-20 MED ORDER — FERROUS SULFATE 325 (65 FE) MG PO TABS
325.0000 mg | ORAL_TABLET | Freq: Two times a day (BID) | ORAL | Status: DC
  Administered 2018-11-20 – 2018-11-21 (×2): 325 mg via ORAL
  Filled 2018-11-20 (×3): qty 1

## 2018-11-20 MED ORDER — ROPINIROLE HCL 1 MG PO TABS
3.0000 mg | ORAL_TABLET | Freq: Every day | ORAL | Status: DC
  Administered 2018-11-20: 21:00:00 3 mg via ORAL
  Filled 2018-11-20 (×3): qty 3

## 2018-11-20 MED ORDER — MIRABEGRON ER 25 MG PO TB24
25.0000 mg | ORAL_TABLET | Freq: Two times a day (BID) | ORAL | Status: DC
  Administered 2018-11-20 – 2018-11-21 (×2): 25 mg via ORAL
  Filled 2018-11-20 (×4): qty 1

## 2018-11-20 MED ORDER — SPIRONOLACTONE 25 MG PO TABS
50.0000 mg | ORAL_TABLET | Freq: Two times a day (BID) | ORAL | Status: DC
  Administered 2018-11-20 – 2018-11-21 (×2): 50 mg via ORAL
  Filled 2018-11-20 (×4): qty 2

## 2018-11-20 MED ORDER — BUPROPION HCL ER (XL) 150 MG PO TB24
150.0000 mg | ORAL_TABLET | Freq: Every day | ORAL | Status: DC
  Administered 2018-11-21: 10:00:00 150 mg via ORAL
  Filled 2018-11-20: qty 1

## 2018-11-20 MED ORDER — HYDROCHLOROTHIAZIDE 12.5 MG PO CAPS
12.5000 mg | ORAL_CAPSULE | Freq: Every day | ORAL | Status: DC
  Administered 2018-11-20: 12.5 mg via ORAL
  Filled 2018-11-20: qty 1

## 2018-11-20 MED ORDER — CLONAZEPAM 0.25 MG PO TBDP
0.2500 mg | ORAL_TABLET | Freq: Two times a day (BID) | ORAL | Status: DC
  Administered 2018-11-20 – 2018-11-21 (×3): 0.25 mg via ORAL
  Filled 2018-11-20 (×7): qty 1

## 2018-11-20 MED ORDER — INSULIN ASPART 100 UNIT/ML ~~LOC~~ SOLN
8.0000 [IU] | Freq: Three times a day (TID) | SUBCUTANEOUS | Status: DC
  Administered 2018-11-20 – 2018-11-21 (×3): 8 [IU] via SUBCUTANEOUS
  Filled 2018-11-20 (×3): qty 1

## 2018-11-20 MED ORDER — SENNA 8.6 MG PO TABS
1.0000 | ORAL_TABLET | Freq: Every day | ORAL | Status: DC
  Filled 2018-11-20: qty 1

## 2018-11-20 MED ORDER — LAMOTRIGINE 25 MG PO TABS
150.0000 mg | ORAL_TABLET | Freq: Two times a day (BID) | ORAL | Status: DC
  Administered 2018-11-20 – 2018-11-21 (×2): 150 mg via ORAL
  Filled 2018-11-20 (×2): qty 1

## 2018-11-20 MED ORDER — MEMANTINE HCL 5 MG PO TABS
5.0000 mg | ORAL_TABLET | Freq: Every day | ORAL | Status: DC
  Administered 2018-11-21: 5 mg via ORAL
  Filled 2018-11-20: qty 1

## 2018-11-20 MED ORDER — LIDOCAINE 5 % EX PTCH
2.0000 | MEDICATED_PATCH | CUTANEOUS | Status: DC
  Administered 2018-11-21: 2 via TRANSDERMAL
  Filled 2018-11-20: qty 2

## 2018-11-20 MED ORDER — TROLAMINE SALICYLATE 10 % EX CREA
1.0000 "application " | TOPICAL_CREAM | Freq: Two times a day (BID) | CUTANEOUS | Status: DC | PRN
  Filled 2018-11-20: qty 85

## 2018-11-20 MED ORDER — ALBUTEROL SULFATE (2.5 MG/3ML) 0.083% IN NEBU: 3.0000 mL | INHALATION_SOLUTION | Freq: Four times a day (QID) | RESPIRATORY_TRACT | Status: DC | PRN

## 2018-11-20 MED ORDER — PANTOPRAZOLE SODIUM 40 MG PO TBEC
40.0000 mg | DELAYED_RELEASE_TABLET | Freq: Every day | ORAL | Status: DC
  Administered 2018-11-21: 40 mg via ORAL
  Filled 2018-11-20: qty 1

## 2018-11-20 MED ORDER — LORAZEPAM 0.5 MG PO TABS: 0.5000 mg | ORAL_TABLET | Freq: Once | ORAL | Status: DC

## 2018-11-20 MED ORDER — METOCLOPRAMIDE HCL 10 MG PO TABS
5.0000 mg | ORAL_TABLET | Freq: Once | ORAL | Status: AC
  Administered 2018-11-20: 5 mg via ORAL
  Filled 2018-11-20: qty 1

## 2018-11-20 MED ORDER — SIMVASTATIN 10 MG PO TABS
10.0000 mg | ORAL_TABLET | Freq: Every day | ORAL | Status: DC
  Administered 2018-11-20: 10 mg via ORAL
  Filled 2018-11-20: qty 1

## 2018-11-20 MED ORDER — METOCLOPRAMIDE HCL 10 MG PO TABS
5.0000 mg | ORAL_TABLET | Freq: Three times a day (TID) | ORAL | Status: DC
  Administered 2018-11-20 – 2018-11-21 (×4): 5 mg via ORAL
  Filled 2018-11-20 (×4): qty 1

## 2018-11-20 MED ORDER — INSULIN GLARGINE 100 UNIT/ML ~~LOC~~ SOLN
42.0000 [IU] | Freq: Every evening | SUBCUTANEOUS | Status: DC
  Administered 2018-11-20: 42 [IU] via SUBCUTANEOUS
  Filled 2018-11-20 (×2): qty 0.42

## 2018-11-20 MED ORDER — OXYBUTYNIN CHLORIDE ER 5 MG PO TB24
5.0000 mg | ORAL_TABLET | Freq: Every day | ORAL | Status: DC
  Administered 2018-11-21: 10:00:00 5 mg via ORAL
  Filled 2018-11-20: qty 1

## 2018-11-20 NOTE — ED Notes (Addendum)
Meal tray placed at bedside, pt requesting CBG check prior to eating. Pt given remote to operate TV at this time.

## 2018-11-20 NOTE — ED Notes (Signed)
Hourly rounding reveals patient in room. No complaints, stable, in no acute distress. Q15 minute rounds and monitoring via Engineer, drilling to continue.

## 2018-11-20 NOTE — ED Notes (Signed)
Walked covid swab to lab

## 2018-11-20 NOTE — Consult Note (Signed)
[name] Consult   Reason for Consult: Increasing anxiety and bipolar mania after medication changes. Referring Physician: Dr. Quentin Cornwall Patient Identification: Stephanie [name] MRN:  [id number] Principal Diagnosis: Bipolar 1 disorder, manic, moderate ([place]) Diagnosis:   Patient Active Problem List   Diagnosis Date Noted  . Bipolar 1 disorder, manic, moderate ([place]) [F31.12] 11/20/2018  . Tremor [R25.1] 10/03/2018  . Loss of memory [R41.3] 07/24/2018  . Numbness and tingling of both legs [R20.0, R20.2] 07/24/2018  . Obesity (BMI 30-39.9) [E66.9] 07/19/2018  . Chronic diastolic CHF (congestive heart failure), NYHA class 2 ([place]) [I50.32] 07/17/2018  . Mild mitral regurgitation [I34.0] 07/05/2018  . Unsteady gait [R26.81] 04/09/2018  . Diabetic peripheral neuropathy associated with type 2 diabetes mellitus ([place]) [E11.42] 04/09/2018  . Laceration without foreign body, right lower leg, initial encounter [S81.811A] 04/09/2018  . Chronic obstructive pulmonary disease, unspecified ([place]) [J44.9] 04/09/2018  . Unilateral primary osteoarthritis, right knee [M17.11] 04/09/2018  . Sleep apnea [G47.30] 04/09/2018  . Chronic hip pain, right [M25.551, G89.29] 04/09/2018  . Closed impacted fracture of hip ([place]) [S72.099A] 04/09/2018  . Tricompartment osteoarthritis of right knee [M17.11] 10/18/2017  . Sepsis ([place]) [A41.9] 07/20/2017  . Obesity, Class III, BMI 40-49.9 (morbid obesity) ([employer]) [E66.01] 05/19/2017  . Cervical stenosis of spine [M48.02] 04/25/2017  . Stress fracture of femur [C16.384T] 04/17/2017  . Elevated alkaline phosphatase level [R74.8] 04/16/2017  . Altered mental status [R41.82] 03/07/2017  . Degenerative tear of lateral meniscus of right knee [M23.300] 11/15/2016  . Encephalopathy [G93.40] 07/19/2016  . Degenerative tear of medial meniscus of right knee [M23.203] 07/01/2016  . Primary osteoarthritis of right knee [M17.11] 07/01/2016  . Diabetes mellitus type 1,  uncontrolled, without complications ([place]) [X64.68] 06/20/2016  . Lymphedema [I89.0] 06/14/2016  . Pain in limb [M79.609] 05/06/2016  . Swelling of limb [M79.89] 05/06/2016  . Postphlebitic syndrome with inflammation [I87.029] 05/06/2016  . Pneumonia [J18.9] 04/18/2016  . Chest pain [R07.9] 04/04/2016  . Iron deficiency anemia [D50.9] 01/30/2016  . Bilateral carotid artery stenosis [I65.23] 01/12/2016  . Hypoglycemia [E16.2] 09/09/2015  . Hyperglycemia [R73.9] 07/16/2015  . Neuropathy [G62.9] 06/08/2015  . Thrombocytopenia ([place]) [D69.6] 08/19/2014  . Abdominal pain, chronic, epigastric [R10.13, G89.29] 06/12/2014  . Liver cirrhosis secondary to NASH ([place]) [K75.81, K74.60] 02/27/2014  . Dizziness and giddiness [R42] 10/30/2013  . DOE (dyspnea on exertion) [R06.09] 10/30/2013  . Poorly controlled type 2 diabetes mellitus with complication ([place]) [E32.1, E11.65] 10/14/2013  . DNR (do not resuscitate) [Z66] 07/29/2013  . Encounter for routine gynecological examination [Z01.419] 07/29/2013  . Dyspnea [R06.00] 07/10/2013  . Lung nodule [R91.1] 07/10/2013  . Varicose veins of left lower extremity [I83.92] 10/09/2012  . Gastroesophageal reflux disease [K21.9] 10/09/2012  . Lower extremity edema [R60.0] 02/02/2012  . Cirrhosis of liver ([place]) [K74.60] 02/02/2012  . Anxiety disorder [F41.9] 07/11/2011  . Aortic dissection ([place]) [I71.00]   . BACK PAIN, LUMBAR, CHRONIC [M54.5] 11/19/2009  . Chronic pain syndrome [G89.4] 11/06/2009  . UNS ADVRS EFF OTH RX MEDICINAL&BIOLOGICAL SBSTNC [T50.995A] 12/16/2008  . Irritable bowel syndrome with constipation [K58.1] 10/23/2008  . UNSPECIFIED VITAMIN D DEFICIENCY [E55.9] 09/26/2008  . TOBACCO ABUSE [F17.200] 06/19/2008  . EDEMA, LOCALIZED [R60.9] 01/07/2008  . Restless legs syndrome [G25.81] 04/27/2007  . INSOMNIA [G47.00] 04/27/2007  . Hyperlipidemia [E78.5] 03/08/2007  . OSA (obstructive sleep apnea) [G47.33] 03/08/2007  . Hypertension [I10]  03/08/2007  . Asthma [J45.909] 03/08/2007  . Urinary incontinence [R32] 03/08/2007  . BIPOLAR AFFECTIVE DISORDER, HX OF [Z86.59] 03/08/2007  Patient is seen, chart is reviewed, collateral obtained from patient's sister Stephanie [name] [phone]) and from patient's ALF, the [facility]. Total Time spent with patient: 1 hour  Subjective: "I just cannot stop crying for the past month.  My son died 3 to 4 years ago when a truck accident.  My medicines are changing and I have been in 3 diabetic comas."  HPI: Stephanie [name] is a 68 y.o. female patient with extensive past medical history presents from nursing facility for evaluation of irrational behavior tearfulness significant mood swings after she was recently taken off of her benzodiazepine. She denies any SI or HI.  Denies any pain.  No hallucinations.  Upon initial psychiatric evaluation, patient is seen pacing in the hallways.  She requires redirection to stay in her room.  Patient expresses that she is very anxious and having pain.  She would like her hydrocodone and Klonopin prescribed.  She also notes that, "I take a little white pill before my meals.  I have to have that pill arrival go back into a diabetic coma."  Patient is provided with reassurance that her medication reconciliation will be done, and patient is redirectable.  Patient describes that her medications have been changed by her primary [name] provider Stephanie Care, MD at her ALF along with her pain doctor, Dr. Primus [name]).  She reports that since they have begun making changes, she has been crying all the time, however she denies being depressed.  She states that she has been having difficulty sleeping, she feels agitated and restless.  She reports that she believes her restless leg medicine has also been stopped, because she is having a difficult time keeping herself from needing to walk.  Patient speech is pressured during conversation.  She is anxious about getting her medications back the  way they were.  She reports that the last time she had a psychiatric admission was in 2001 when she was admitted to [facility].  She reports that since that time she had been stable until recently but she cannot recall all of the medication she is on.  She states understanding that her doctors were wanting to decrease her medication load, however she believes that they have stopped the wrong medicines.  Patient is denying any suicidal or homicidal ideation.  She is currently denying any auditory or visual hallucinations.  Patient displays mood lability during interview and is tearful during assessment for brief moments.  Collateral is obtained from patient's sister Stephanie [name] [phone]): Sister relates that due to COVID-19 she has not been able to visit her sister in the nursing home, however she has noticed over the past month patient has been telling her that she is not eating because she is afraid of COVID-19.  She reports that sister believes that she was told she was not a diabetic however also tells her that she has been in 3 diabetic comas.  Sister reports that she will continuously call her by phone up to 15 times in a row.  She describes that she will be laughing and then crying, speaking quickly and rapidly changing topics during conversation.  Sister expresses understanding that patient is been on a lot of medications, and describes her sister as manipulative.  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  Sister notes that patient has been stable since  her hospitalization at [facility] in 2001 up until 6 months ago when her medications began to be adjusted.  She notes that sister does have a history of psychosis along with her bipolar disorder and is concerned that if patient's medications are not stabilized she will continue to  decompensate.   Collateral from [name] provider at the [place], [place]:  Patient historically has not had any behavioral problems.  They note that patient has been seeing a pain provider with a plan to stop opiates.  Unfortunately, after opiates were stopped patient became very agitated to the point that opiates were restarted.  Since that time, the providers have attempted to stop her benzodiazepines to which patient has had increased anxiety, poor sleep poor appetite increased pacing and increased agitation.  She has not harmed anybody.  She has not endorsed any intent of self-harm.  She is welcome to return to the facility once her medications have been re-stabilized.    Per record review with updates: Social history: Lives at the [facility].  Sister, Stephanie [name] is involved in patient's [name].  Medical history: Multiple medical problems, see problem list.  Very wide array of problems going back years including head injuries.  Substance abuse history: History of opiate addiction/medication seeking behavior for the past 15 years  Past Psychiatric History: Bipolar 1, generalized anxiety disorder, insomnia; opiate use disorder Patient has at least 1 past psychiatric hospitalization in 2001 at [facility]. Per record review, patient has been on Abilify up to 30 mg in the past with psychotic disorder.  Risk to Self:  Denies Risk to Others:  Denies Prior Inpatient Therapy:  Yes, last in 2001 at [facility] Prior Outpatient Therapy:  Yes, current with Dr. Shea Evans, last visit 11/13/2018.  Past Medical History:  Past Medical History:  Diagnosis Date  . Adenomatous colon polyp 08/27/2014   Polyps x 3  . Anemia   . Anxiety   . 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 Arthritis   . Asthma   . Bipolar affective disorder ([place])    h/o  . Blood transfusion 2000  . Blood transfusion without reported diagnosis   . Cancer ([place])   . Chronic abdominal pain    abdominal wall pain  . Cirrhosis ([place])    related to NASH   . Colon polyp   . COPD (chronic obstructive pulmonary disease) ([place])   . Depression   . Diabetes mellitus without complication ([place])   . Fatty liver 04/09/08   found in abd CT  . GERD (gastroesophageal reflux disease)   . H/O: CVA (cardiovascular accident)    TIA  . H/O: rheumatic fever   . Hepatitis    HEP "B" twenty years ago  . Hyperlipidemia   . Hypertension   . IBS (irritable bowel syndrome)   . Incontinence   . Insomnia   . Lower extremity edema   . Neuropathy    lower legs D/T DM  . Obstructive sleep apnea    Use C-PAP twice per week  . Platelets decreased ([place])   . RLS (restless legs syndrome)   . Shortness of breath   . Sleep apnea   . Stroke [facility])    TIA- 2002  . Ulcer   . Unspecified disorders of nervous system   . Wears dentures    full upper and lower    Past Surgical History:  Procedure Laterality Date  . ABDOMINAL EXPLORATION SURGERY    . ABDOMINAL HYSTERECTOMY     total  .  Axillary artery cannulation via 8-mm Hemashield graft, median sternotomy, extracorporeal circulation with deep hypothermic circulatory arrest, repair of  aortic dessection  01/23/2009   Dr Arlyce Dice  . BACK SURGERY      x 5  . cataract     both eyes  . CHOLECYSTECTOMY    . COLONOSCOPY    . EYE SURGERY Bilateral    Cataract Extraction with IOL  . FACIAL LACERATION REPAIR Left 04/11/2018   Procedure: EARLOBE REPAIR;  Surgeon: Carloyn Manner, MD;  Location: [facility];  Service: ENT;  Laterality: Left;  Diabetic - insulin and oral meds sleep apnea  . HEMORROIDECTOMY     and colon polyp removed  . KNEE ARTHROSCOPY WITH LATERAL RELEASE Right 11/15/2016   Procedure: KNEE ARTHROSCOPY WITH LATERAL RELEASE;  Surgeon: Corky Mull, MD;  Location: [facility];  Service: Orthopedics;  Laterality: Right;  . LIVER BIOPSY  04/10/2012   Procedure: LIVER BIOPSY;  Surgeon: Inda Castle, MD;  Location: [facility];  Service: Endoscopy;  Laterality: N/A;  ultrasound to mark order for  abd limited/liver to be marked to be sent by [email or web address]   . LUMBAR FUSION  10/09  . LUMBAR WOUND DEBRIDEMENT  05/09/2011   Procedure: LUMBAR WOUND DEBRIDEMENT;  Surgeon: Dahlia Bailiff;  Location: [facility];  Service: Orthopedics;  Laterality: N/A;  IRRIGATION AND DEBRIDEMENT SPINAL WOUND  . POSTERIOR CERVICAL FUSION/FORAMINOTOMY    . ROTATOR CUFF REPAIR     left   Family History:  Family History  Problem Relation Age of Onset  . Breast cancer Mother   . Lung cancer Mother   . Stomach cancer Father   . Diabetes Other        4 aunts, and 1 uncle  . Depression Brother   . Anxiety disorder Brother   . Colon cancer Neg Hx   . Esophageal cancer Neg Hx   . Rectal cancer Neg Hx    Family Psychiatric  History: Patient does not know of any   Social History:  Social History   Substance and Sexual Activity  Alcohol Use No  . Alcohol/week: 0.0 standard drinks     Social History   Substance and Sexual Activity  Drug Use No    Social History   Socioeconomic History  . Marital status: Divorced    Spouse name: Not on file  . Number of children: 2  . Years of education: Not on file  . Highest education level: 8th grade  Occupational History  . Occupation: Disabled    Employer: [employer]  Social Needs  . Financial resource strain: Not hard at all  . Food insecurity    Worry: Never true    Inability: Never true  . Transportation needs    Medical: No    Non-medical: No  Tobacco Use  . Smoking status: Former Smoker    Packs/day: 1.50    Years: 50.00    Pack years: 75.00    Types: Cigarettes    Quit date: 04/06/2013    Years since quitting: 5.6  . Smokeless tobacco: Never Used  Substance and Sexual Activity  . Alcohol use: No    Alcohol/week: 0.0 standard drinks  . Drug use: No  . Sexual activity: Never  Lifestyle  . Physical activity    Days per week: 0 days    Minutes per session: 0 min  . Stress: Very much  Relationships  . Social connections    Talks on phone:  Not on file  Gets together: Not on file    Attends religious service: Never    Active member of club or organization: No    Attends meetings of clubs or organizations: Never    Relationship status: Divorced  Other Topics Concern  . Not on file  Social History Narrative   1 son, 38+ y/o   Daily caffeine use: 2 cups daily   Does not get regular exercise   Disability due to bipolar      Has a living will- she is a DNR (she has form at home per pt).   Additional Social History:  Lives at the [facility]  Son died 3 to 4 years ago in a truck accident (2016 2017)  Allergies:   Allergies  Allergen Reactions  . Cortisone     Increased BS  . Doxycycline Hives, Swelling, Other (See Comments) and Nausea Only    Reaction:  Facial swelling  Face red and swollen; no difficulty breathing    Labs:  Results for orders placed or performed during the hospital encounter of 11/20/18 (from the past 48 hour(s))  Comprehensive metabolic panel     Status: Abnormal   Collection Time: 11/20/18 10:52 AM  Result Value Ref Range   Sodium 138 135 - 145 mmol/L   Potassium 4.2 3.5 - 5.1 mmol/L   Chloride 102 98 - 111 mmol/L   CO2 23 22 - 32 mmol/L   Glucose, Bld 198 (H) 70 - 99 mg/dL   BUN 13 8 - 23 mg/dL   Creatinine, Ser 1.16 (H) 0.44 - 1.00 mg/dL   Calcium 9.0 8.9 - 10.3 mg/dL   Total Protein 7.1 6.5 - 8.1 g/dL   Albumin 3.9 3.5 - 5.0 g/dL   AST 22 15 - 41 U/L   ALT 16 0 - 44 U/L   Alkaline Phosphatase 164 (H) 38 - 126 U/L   Total Bilirubin 0.5 0.3 - 1.2 mg/dL   GFR calc non Af Amer 48 (L) >60 mL/min   GFR calc Af Amer 56 (L) >60 mL/min   Anion gap 13 5 - 15    Comment: Performed at [facility] [address] [place], [place] [address]  Ethanol     Status: None   Collection Time: 11/20/18 10:52 AM  Result Value Ref Range   Alcohol, Ethyl (B) <10 <10 mg/dL    Comment: (NOTE) Lowest detectable limit for serum alcohol is 10 mg/dL. For medical purposes only. Performed at  [facility], [address] [place], [place] [address]   Salicylate level     Status: None   Collection Time: 11/20/18 10:52 AM  Result Value Ref Range   Salicylate Lvl <2.6 2.8 - 30.0 mg/dL    Comment: Performed at [facility], [address] [place], [place] [address]  Acetaminophen level     Status: Abnormal   Collection Time: 11/20/18 10:52 AM  Result Value Ref Range   Acetaminophen (Tylenol), Serum <10 (L) 10 - 30 ug/mL    Comment: (NOTE) Therapeutic concentrations vary significantly. A range of 10-30 ug/mL  may be an effective concentration for many patients. However, some  are best treated at concentrations outside of this range. Acetaminophen concentrations >150 ug/mL at 4 hours after ingestion  and >50 ug/mL at 12 hours after ingestion are often associated with  toxic reactions. Performed at [facility], [address], [place], [place] [address]   cbc     Status: Abnormal   Collection Time: 11/20/18 10:52  AM  Result Value Ref Range   WBC 7.3 4.0 - 10.5 K/uL   RBC 3.82 (L) 3.87 - 5.11 MIL/uL   Hemoglobin 11.7 (L) 12.0 - 15.0 g/dL   HCT 35.5 (L) 36.0 - 46.0 %   MCV 92.9 80.0 - 100.0 fL   MCH 30.6 26.0 - 34.0 pg   MCHC 33.0 30.0 - 36.0 g/dL   RDW 16.4 (H) 11.5 - 15.5 %   Platelets 102 (L) 150 - 400 K/uL    Comment: Immature Platelet Fraction may be clinically indicated, consider ordering this additional test [id number]    nRBC 0.0 0.0 - 0.2 %    Comment: Performed at [facility], [address], [place], [place] [address]   *Note: Due to a large number of results and/or encounters for the requested time period, some results have not been displayed. A complete set of results can be found in Results Review.    Current Facility-Administered Medications  Medication Dose Route Frequency Provider Last Rate Last Dose  . clonazePAM (KLONOPIN) disintegrating tablet 0.25 mg  0.25 mg Oral BID Merlyn Lot, MD       Current  Outpatient Medications  Medication Sig Dispense Refill  . albuterol (PROVENTIL HFA;VENTOLIN HFA) 108 (90 Base) MCG/ACT inhaler Inhale 2 puffs into the lungs every 6 (six) hours as needed for wheezing or shortness of breath.    . budesonide-formoterol (SYMBICORT) 160-4.5 MCG/ACT inhaler Inhale 2 puffs into the lungs 2 (two) times daily.     Marland Kitchen buPROPion (WELLBUTRIN XL) 150 MG 24 hr tablet Take 1 tablet (150 mg total) by mouth daily. 90 tablet 1  . diclofenac sodium (VOLTAREN) 1 % GEL Apply topically 3 (three) times daily.    Marland Kitchen dicyclomine (BENTYL) 10 MG capsule Take 10 mg by mouth 3 (three) times daily.     Marland Kitchen donepezil (ARICEPT) 10 MG tablet Take by mouth.    . ferrous sulfate 325 (65 FE) MG tablet Take 325 mg by mouth 2 (two) times daily.     . furosemide (LASIX) 80 MG tablet Take 80 mg by mouth.    . gabapentin (NEURONTIN) 300 MG capsule 300-600 mg. Takes 300 mg at 8 am , 2pm, 600 mg 8 pm.    . hydrochlorothiazide (MICROZIDE) 12.5 MG capsule Take 12.5 mg by mouth daily with supper.    . lamoTRIgine (LAMICTAL) 150 MG tablet Take 1 tablet (150 mg total) by mouth 2 (two) times daily. 180 tablet 0  . nystatin (MYCOSTATIN/NYSTOP) powder Apply topically 4 (four) times daily. (Patient taking differently: Apply topically 4 (four) times daily. Apply to inner thigh/ peri area twice daily and apply to abdomen 4 times daily.) 15 g 0  . senna (GERI-KOT) 8.6 MG tablet Take 1 tablet by mouth daily.    Marland Kitchen albuterol (PROVENTIL) (2.5 MG/3ML) 0.083% nebulizer solution Take 2.5 mg by nebulization every 6 (six) hours as needed for wheezing or shortness of breath.    . bacitracin 500 UNIT/GM ointment Apply 1 application topically 2 (two) times daily. 15 g 0  . donepezil (ARICEPT) 5 MG tablet     . Glucagon 3 MG/DOSE POWD as directed    . HYDROcodone-acetaminophen (NORCO) 7.5-325 MG tablet Take 1 tablet by mouth every 6 (six) hours as needed for moderate pain.    Marland Kitchen ibuprofen (ADVIL) 600 MG tablet 1 tablet with food or  milk as needed    . insulin glargine (LANTUS) 100 UNIT/ML injection Inject 50 Units into the skin every evening.     Marland Kitchen  insulin lispro (HUMALOG) 100 UNIT/ML injection Inject 8 Units into the skin 3 (three) times daily before meals.     . lactulose (CHRONULAC) 10 GM/15ML solution Take 10 g by mouth daily. (0800)    . lidocaine (LIDODERM) 5 % Place 2 patches onto the skin daily. Apply 1 patch to 2 areas on back (2 patches) every morning and remove at bedtime.    . liraglutide (VICTOZA) 18 MG/3ML SOPN Inject 1.8 mg into the skin daily.    . memantine (NAMENDA) 5 MG tablet Take by mouth.    . metoCLOPramide (REGLAN) 5 MG tablet Take 5 mg by mouth 4 (four) times daily -  before meals and at bedtime. (0800, 1200, 1600 & 2000)    . MYRBETRIQ 25 MG TB24 tablet     . naloxone (NARCAN) nasal spray 4 mg/0.1 mL Place 1 spray into the nose.    Marland Kitchen omeprazole (PRILOSEC) 20 MG capsule Take 20 mg by mouth daily.     . ondansetron (ZOFRAN) 4 MG tablet     . oxybutynin (DITROPAN-XL) 5 MG 24 hr tablet Take 5 mg by mouth daily. (0800)    . pregabalin (LYRICA) 75 MG capsule     . rOPINIRole (REQUIP) 3 MG tablet Take 3 mg by mouth at bedtime. (2000)    . simvastatin (ZOCOR) 10 MG tablet Take 10 mg by mouth at bedtime.    Marland Kitchen spironolactone (ALDACTONE) 50 MG tablet Take 50 mg by mouth 2 (two) times daily. (0800)    . trolamine salicylate (ASPERCREME) 10 % cream Apply 1 application topically 2 (two) times daily as needed for muscle pain.      Musculoskeletal: Strength & Muscle Tone: within normal limits Gait & Station: normal Patient leans: N/A  Psychiatric Specialty Exam: Physical Exam  Nursing note and vitals reviewed. Constitutional: She appears well-developed and well-nourished. She appears distressed.  HENT:  Head: Normocephalic and atraumatic.  Eyes: EOM are normal.  Neck: Normal range of motion.  Cardiovascular: Normal rate and regular rhythm.  Respiratory: Effort normal. No respiratory distress.   Musculoskeletal: Normal range of motion.  Neurological: She is alert.  Psychiatric: Her mood appears anxious. Her speech is rapid and/or pressured. She is agitated and hyperactive. Thought content is paranoid and delusional. Cognition and memory are impaired. She expresses impulsivity. She expresses no homicidal and no suicidal ideation. She is inattentive.    Review of Systems  Constitutional: Negative.   HENT: Negative.   Eyes: Negative.   Respiratory: Negative.   Cardiovascular: Negative.   Gastrointestinal: Positive for abdominal pain.  Musculoskeletal: Positive for back pain, joint pain and myalgias.  Skin: Negative.   Neurological: Negative.   Psychiatric/Behavioral: Positive for depression and memory loss. Negative for hallucinations, substance abuse and suicidal ideas. The patient is nervous/anxious and has insomnia.     Blood pressure 137/70, pulse 75, temperature 98.3 F (36.8 C), temperature source Oral, resp. rate 18, height 5' 3"  (1.6 m), weight 97.5 kg, SpO2 96 %.Body mass index is 38.09 kg/m.  General Appearance: Casual  Eye Contact:  Fair  Speech:  Pressured  Volume:  Increased  Mood:  Anxious and Dysphoric  Affect:  Constricted and Labile  Thought Process:  Goal Directed  Orientation:  Full (Time, Place, and Person)  Thought Content:  Illogical, Hallucinations: None and Rumination  Suicidal Thoughts:  No  Homicidal Thoughts:  No  Memory:  Immediate;   Fair Recent;   Fair Remote;   Fair  Judgement:  Impaired  Insight:  Shallow  Psychomotor Activity:  Restlessness  Concentration:  Concentration: Fair  Recall:  [employer] of Knowledge:  Fair  Language:  Fair  Akathisia:  No  Handed:  Right  AIMS (if indicated):     Assets:  Agricultural consultant Housing Social Support  ADL's:  Impaired  Cognition:  Impaired,  Mild  Sleep:   Decreased     Treatment Plan Summary: EMALI HEYWARD is a 68 y.o. female with a history of  bipolar disorder and generalized anxiety who has been undergoing medication changes and now presents with mania.  I have re-added clonazepam to her medication regimen, however ultimately her PCP and pain medicine providers would like to see her off of this medication.  Patient historically has required antipsychotics for mood control.  Her mood has been labile in the emergency department.  She would benefit from inpatient psychiatric admission for medication management and stabilization.  Daily contact with patient to assess and evaluate symptoms and progress in treatment and Medication management  Restarted home medications  Disposition: Recommend psychiatric Inpatient admission when medically cleared. Supportive therapy provided about ongoing stressors.  Recommend admission to the geriatric psychiatry unit. Chest x-ray, EKG, UA, UDS, TSH, CBC, CMP, and COVID screening have been completed. COVID negative Urine positive for opiates Blood alcohol level, salicylate level, acetaminophen level negative Lipids and Hemoglobin A1c pending.   Lavella Hammock, MD 11/20/2018 1:01 PM

## 2018-11-20 NOTE — ED Provider Notes (Signed)
[facility] Emergency Department Provider Note    First MD Initiated Contact with Patient 11/20/18 1119     (approximate)  I have reviewed the triage vital signs and the nursing notes.   HISTORY  Chief Complaint Mental Health Problem and Depression    HPI Stephanie [name] is a 68 y.o. female   close past medical history presents from nursing facility for evaluation of irrational behavior tearfulness significant mood swings after she was recently taken off of her benzodiazepine.  She denies any SI or HI.  Denies any pain.  No hallucinations.   Past Medical History:  Diagnosis Date  . Adenomatous colon polyp 08/27/2014   Polyps x 3  . Anemia   . Anxiety   . 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 Arthritis   . Asthma   . Bipolar affective disorder ([place])    h/o  . Blood transfusion 2000  . Blood transfusion without reported diagnosis   . Cancer ([place])   . Chronic abdominal pain    abdominal wall pain  . 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 Colon polyp   . COPD (chronic obstructive pulmonary disease) ([place])   . Depression   . Diabetes mellitus without complication ([place])   . Fatty liver 04/09/08   found in abd CT  . GERD (gastroesophageal reflux disease)   . H/O: CVA (cardiovascular accident)    TIA  . H/O: rheumatic fever   . Hepatitis    HEP "B" twenty years ago  . Hyperlipidemia   . Hypertension   . IBS (irritable bowel syndrome)   . Incontinence   . Insomnia   . Lower extremity edema   . Neuropathy    lower legs D/T DM  . Obstructive sleep apnea    Use C-PAP twice per week  . Platelets decreased ([place])   . RLS (restless legs syndrome)   . Shortness of breath   . Sleep apnea   . Stroke [facility])    TIA- 2002  . Ulcer   . Unspecified disorders of nervous system   . Wears dentures    full upper and lower   Family History  Problem Relation Age of Onset  . Breast cancer Mother   . Lung cancer Mother   . Stomach cancer Father   .  Diabetes Other        4 aunts, and 1 uncle  . Depression Brother   . Anxiety disorder Brother   . Colon cancer Neg Hx   . Esophageal cancer Neg Hx   . Rectal cancer Neg Hx    Past Surgical History:  Procedure Laterality Date  . ABDOMINAL EXPLORATION SURGERY    . ABDOMINAL HYSTERECTOMY     total  . Axillary artery cannulation via 8-mm Hemashield graft, median sternotomy, extracorporeal circulation with deep hypothermic circulatory arrest, repair of  aortic dessection  01/23/2009   Dr Arlyce Dice  . BACK SURGERY      x 5  . cataract     both eyes  . CHOLECYSTECTOMY    . COLONOSCOPY    . EYE SURGERY Bilateral    Cataract Extraction with IOL  . FACIAL LACERATION REPAIR Left 04/11/2018   Procedure: EARLOBE REPAIR;  Surgeon: Carloyn Manner, MD;  Location: [facility];  Service: ENT;  Laterality: Left;  Diabetic - insulin and oral meds sleep apnea  . HEMORROIDECTOMY     and colon polyp removed  . KNEE ARTHROSCOPY  WITH LATERAL RELEASE Right 11/15/2016   Procedure: KNEE ARTHROSCOPY WITH LATERAL RELEASE;  Surgeon: Corky Mull, MD;  Location: [facility];  Service: Orthopedics;  Laterality: Right;  . LIVER BIOPSY  04/10/2012   Procedure: LIVER BIOPSY;  Surgeon: Inda Castle, MD;  Location: [facility];  Service: Endoscopy;  Laterality: N/A;  ultrasound to mark order for abd limited/liver to be marked to be sent by [email or web address]   . LUMBAR FUSION  10/09  . LUMBAR WOUND DEBRIDEMENT  05/09/2011   Procedure: LUMBAR WOUND DEBRIDEMENT;  Surgeon: Dahlia Bailiff;  Location: [facility];  Service: Orthopedics;  Laterality: N/A;  IRRIGATION AND DEBRIDEMENT SPINAL WOUND  . POSTERIOR CERVICAL FUSION/FORAMINOTOMY    . ROTATOR CUFF REPAIR     left   Patient Active Problem List   Diagnosis Date Noted  . Tremor 10/03/2018  . Loss of memory 07/24/2018  . Numbness and tingling of both legs 07/24/2018  . Obesity (BMI 30-39.9) 07/19/2018  . Chronic diastolic CHF (congestive heart failure), NYHA class 2  ([place]) 07/17/2018  . Mild mitral regurgitation 07/05/2018  . Unsteady gait 04/09/2018  . Diabetic peripheral neuropathy associated with type 2 diabetes mellitus ([place]) 04/09/2018  . Laceration without foreign body, right lower leg, initial encounter 04/09/2018  . Chronic obstructive pulmonary disease, unspecified ([place]) 04/09/2018  . Unilateral primary osteoarthritis, right knee 04/09/2018  . Sleep apnea 04/09/2018  . Chronic hip pain, right 04/09/2018  . Closed impacted fracture of hip ([place]) 04/09/2018  . Tricompartment osteoarthritis of right knee 10/18/2017  . Sepsis ([place]) 07/20/2017  . Obesity, Class III, BMI 40-49.9 (morbid obesity) ([place]) 05/19/2017  . Cervical stenosis of spine 04/25/2017  . Stress fracture of femur 04/17/2017  . Elevated alkaline phosphatase level 04/16/2017  . Altered mental status 03/07/2017  . Degenerative tear of lateral meniscus of right knee 11/15/2016  . Encephalopathy 07/19/2016  . Degenerative tear of medial meniscus of right knee 07/01/2016  . Primary osteoarthritis of right knee 07/01/2016  . Diabetes mellitus type 1, uncontrolled, without complications ([place]) 59/56/3875  . Lymphedema 06/14/2016  . Pain in limb 05/06/2016  . Swelling of limb 05/06/2016  . Postphlebitic syndrome with inflammation 05/06/2016  . Pneumonia 04/18/2016  . Chest pain 04/04/2016  . Iron deficiency anemia 01/30/2016  . Bilateral carotid artery stenosis 01/12/2016  . Hypoglycemia 09/09/2015  . Hyperglycemia 07/16/2015  . Neuropathy 06/08/2015  . Thrombocytopenia ([place]) 08/19/2014  . Abdominal pain, chronic, epigastric 06/12/2014  . Liver cirrhosis secondary to NASH ([place]) 02/27/2014  . Dizziness and giddiness 10/30/2013  . DOE (dyspnea on exertion) 10/30/2013  . Poorly controlled type 2 diabetes mellitus with complication ([place]) 64/33/2951  . DNR (do not resuscitate) 07/29/2013  . Encounter for routine gynecological examination 07/29/2013  . Dyspnea 07/10/2013  . Lung  nodule 07/10/2013  . Varicose veins of left lower extremity 10/09/2012  . Gastroesophageal reflux disease 10/09/2012  . Lower extremity edema 02/02/2012  . Cirrhosis of liver ([place]) 02/02/2012  . Anxiety disorder 07/11/2011  . Aortic dissection ([place])   . BACK PAIN, LUMBAR, CHRONIC 11/19/2009  . Chronic pain syndrome 11/06/2009  . UNS ADVRS EFF OTH RX MEDICINAL&BIOLOGICAL SBSTNC 12/16/2008  . Irritable bowel syndrome with constipation 10/23/2008  . UNSPECIFIED VITAMIN D DEFICIENCY 09/26/2008  . TOBACCO ABUSE 06/19/2008  . EDEMA, LOCALIZED 01/07/2008  . Restless legs syndrome 04/27/2007  . INSOMNIA 04/27/2007  . Hyperlipidemia 03/08/2007  . OSA (obstructive sleep apnea) 03/08/2007  . Hypertension 03/08/2007  . Asthma 03/08/2007  . Urinary incontinence 03/08/2007  .  BIPOLAR AFFECTIVE DISORDER, HX OF 03/08/2007      Prior to Admission medications   Medication Sig Start Date End Date Taking? Authorizing Provider  albuterol (PROVENTIL HFA;VENTOLIN HFA) 108 (90 Base) MCG/ACT inhaler Inhale 2 puffs into the lungs every 6 (six) hours as needed for wheezing or shortness of breath.   Yes [provider]  budesonide-formoterol (SYMBICORT) 160-4.5 MCG/ACT inhaler Inhale 2 puffs into the lungs 2 (two) times daily.    Yes [provider]  buPROPion (WELLBUTRIN XL) 150 MG 24 hr tablet Take 1 tablet (150 mg total) by mouth daily. 08/14/18  Yes Ursula Alert, MD  diclofenac sodium (VOLTAREN) 1 % GEL Apply topically 3 (three) times daily.   Yes [provider]  dicyclomine (BENTYL) 10 MG capsule Take 10 mg by mouth 3 (three) times daily.    Yes [provider]  donepezil (ARICEPT) 10 MG tablet Take by mouth. 10/02/18 11/20/18 Yes [provider]  ferrous sulfate 325 (65 FE) MG tablet Take 325 mg by mouth 2 (two) times daily.    Yes [provider]  furosemide (LASIX) 80 MG tablet Take 80 mg by mouth.   Yes [provider]  gabapentin  (NEURONTIN) 300 MG capsule 300-600 mg. Takes 300 mg at 8 am , 2pm, 600 mg 8 pm. 10/02/18  Yes [provider]  hydrochlorothiazide (MICROZIDE) 12.5 MG capsule Take 12.5 mg by mouth daily with supper.   Yes [provider]  HYDROcodone-acetaminophen (NORCO) 7.5-325 MG tablet Take 1 tablet by mouth every 6 (six) hours as needed for moderate pain.   Yes [provider]  insulin glargine (LANTUS) 100 UNIT/ML injection Inject 42 Units into the skin every evening.    Yes [provider]  insulin lispro (HUMALOG) 100 UNIT/ML injection Inject 8 Units into the skin 3 (three) times daily before meals.    Yes [provider]  lactulose (CHRONULAC) 10 GM/15ML solution Take 10 g by mouth daily. (0800) 10/17/16  Yes [provider]  lamoTRIgine (LAMICTAL) 150 MG tablet Take 1 tablet (150 mg total) by mouth 2 (two) times daily. 11/13/18  Yes Eappen, Ria Clock, MD  lidocaine (LIDODERM) 5 % Place 2 patches onto the skin daily. Apply 1 patch to 2 areas on back (2 patches) every morning and remove at bedtime.   Yes [provider]  liraglutide (VICTOZA) 18 MG/3ML SOPN Inject 1.8 mg into the skin daily.   Yes [provider]  memantine (NAMENDA) 5 MG tablet Take 5 mg by mouth daily.  10/30/18 10/30/19 Yes [provider]  metoCLOPramide (REGLAN) 5 MG tablet Take 5 mg by mouth 4 (four) times daily -  before meals and at bedtime. (0800, 1200, 1600 & 2000)   Yes [provider]  MYRBETRIQ 25 MG TB24 tablet Take 25 mg by mouth 2 (two) times a day.  01/03/18  Yes [provider]  nystatin (MYCOSTATIN/NYSTOP) powder Apply topically 4 (four) times daily. Patient taking differently: Apply topically 4 (four) times daily. Apply to inner thigh/ peri area twice daily and apply to abdomen 4 times daily. 08/07/17  Yes Corcoran, Drue Second, MD  omeprazole (PRILOSEC) 20 MG capsule Take 20 mg by mouth daily.    Yes [provider]  ondansetron  (ZOFRAN) 4 MG tablet Take 4 mg by mouth every 8 (eight) hours as needed for nausea.  08/31/18  Yes [provider]  oxybutynin (DITROPAN-XL) 5 MG 24 hr tablet Take 5 mg by mouth daily. (0800)  Yes [provider]  pregabalin (LYRICA) 75 MG capsule Take 75 mg by mouth 3 (three) times daily.  10/30/18  Yes [provider]  rOPINIRole (REQUIP) 3 MG tablet Take 3 mg by mouth at bedtime. (2000)   Yes [provider]  senna (GERI-KOT) 8.6 MG tablet Take 1 tablet by mouth daily.   Yes [provider]  simvastatin (ZOCOR) 10 MG tablet Take 10 mg by mouth at bedtime.   Yes [provider]  spironolactone (ALDACTONE) 50 MG tablet Take 50 mg by mouth 2 (two) times daily. (0800)   Yes [provider]  trolamine salicylate (ASPERCREME) 10 % cream Apply 1 application topically 2 (two) times daily as needed for muscle pain.   Yes [provider]  albuterol (PROVENTIL) (2.5 MG/3ML) 0.083% nebulizer solution Take 2.5 mg by nebulization every 6 (six) hours as needed for wheezing or shortness of breath.    [provider]  bacitracin 500 UNIT/GM ointment Apply 1 application topically 2 (two) times daily. 04/11/18   Carloyn Manner, MD  donepezil (ARICEPT) 5 MG tablet  08/18/18   [provider]  Glucagon 3 MG/DOSE POWD as directed    [provider]  ibuprofen (ADVIL) 600 MG tablet 1 tablet with food or milk as needed    [provider]  naloxone (NARCAN) nasal spray 4 mg/0.1 mL Place 1 spray into the nose.    [provider]    Allergies Cortisone and Doxycycline    Social History Social History   Tobacco Use  . Smoking status: Former Smoker    Packs/day: 1.50    Years: 50.00    Pack years: 75.00    Types: Cigarettes    Quit date: 04/06/2013    Years since quitting: 5.6  . Smokeless tobacco: Never Used  Substance Use Topics  . Alcohol use: No    Alcohol/week: 0.0 standard drinks  .  Drug use: No    Review of Systems Patient denies headaches, rhinorrhea, blurry vision, numbness, shortness of breath, chest pain, edema, cough, abdominal pain, nausea, vomiting, diarrhea, dysuria, fevers, rashes or hallucinations unless otherwise stated above in HPI. ____________________________________________   PHYSICAL EXAM:  VITAL SIGNS: Vitals:   11/20/18 1047 11/20/18 1123  BP:  137/70  Pulse:  75  Resp:  18  Temp: 98.3 F (36.8 C)   SpO2:  96%    Constitutional: Alert and oriented.  Eyes: Conjunctivae are normal.  Head: Atraumatic. Nose: No congestion/rhinnorhea. Mouth/Throat: Mucous membranes are moist.   Neck: No stridor. Painless ROM.  Cardiovascular: Normal rate, regular rhythm. Grossly normal heart sounds.  Good peripheral circulation. Respiratory: Normal respiratory effort.  No retractions. Lungs CTAB. Gastrointestinal: Soft and nontender. No distention. No abdominal bruits. No CVA tenderness. Genitourinary:  Musculoskeletal: No lower extremity tenderness nor edema.  No joint effusions. Neurologic:  Normal speech and language. No gross focal neurologic deficits are appreciated. No facial droop Skin:  Skin is warm, dry and intact. No rash noted. Psychiatric: Mood and affect are melancholy and tearful Speech and behavior are normal.  ____________________________________________   LABS (all labs ordered are listed, but only abnormal results are displayed)  Results for orders placed or performed during the hospital encounter of 11/20/18 (from the past 24 hour(s))  Comprehensive metabolic panel     Status: Abnormal   Collection Time: 11/20/18 10:52 AM  Result Value Ref Range   Sodium 138 135 - 145 mmol/L   Potassium 4.2 3.5 - 5.1 mmol/L   Chloride  102 98 - 111 mmol/L   CO2 23 22 - 32 mmol/L   Glucose, Bld 198 (H) 70 - 99 mg/dL   BUN 13 8 - 23 mg/dL   Creatinine, Ser 1.16 (H) 0.44 - 1.00 mg/dL   Calcium 9.0 8.9 - 10.3 mg/dL   Total Protein 7.1 6.5 - 8.1  g/dL   Albumin 3.9 3.5 - 5.0 g/dL   AST 22 15 - 41 U/L   ALT 16 0 - 44 U/L   Alkaline Phosphatase 164 (H) 38 - 126 U/L   Total Bilirubin 0.5 0.3 - 1.2 mg/dL   GFR calc non Af Amer 48 (L) >60 mL/min   GFR calc Af Amer 56 (L) >60 mL/min   Anion gap 13 5 - 15  Ethanol     Status: None   Collection Time: 11/20/18 10:52 AM  Result Value Ref Range   Alcohol, Ethyl (B) <18 <56 mg/dL  Salicylate level     Status: None   Collection Time: 11/20/18 10:52 AM  Result Value Ref Range   Salicylate Lvl <3.1 2.8 - 30.0 mg/dL  Acetaminophen level     Status: Abnormal   Collection Time: 11/20/18 10:52 AM  Result Value Ref Range   Acetaminophen (Tylenol), Serum <10 (L) 10 - 30 ug/mL  cbc     Status: Abnormal   Collection Time: 11/20/18 10:52 AM  Result Value Ref Range   WBC 7.3 4.0 - 10.5 K/uL   RBC 3.82 (L) 3.87 - 5.11 MIL/uL   Hemoglobin 11.7 (L) 12.0 - 15.0 g/dL   HCT 35.5 (L) 36.0 - 46.0 %   MCV 92.9 80.0 - 100.0 fL   MCH 30.6 26.0 - 34.0 pg   MCHC 33.0 30.0 - 36.0 g/dL   RDW 16.4 (H) 11.5 - 15.5 %   Platelets 102 (L) 150 - 400 K/uL   nRBC 0.0 0.0 - 0.2 %  Urinalysis, Routine w reflex microscopic     Status: Abnormal   Collection Time: 11/20/18 10:52 AM  Result Value Ref Range   Color, Urine STRAW (A) YELLOW   APPearance CLEAR (A) CLEAR   Specific Gravity, Urine 1.006 1.005 - 1.030   pH 7.0 5.0 - 8.0   Glucose, UA 50 (A) NEGATIVE mg/dL   Hgb urine dipstick NEGATIVE NEGATIVE   Bilirubin Urine NEGATIVE NEGATIVE   Ketones, ur NEGATIVE NEGATIVE mg/dL   Protein, ur NEGATIVE NEGATIVE mg/dL   Nitrite NEGATIVE NEGATIVE   Leukocytes,Ua NEGATIVE NEGATIVE  Urine Drug Screen, Qualitative ([facility] only)     Status: Abnormal   Collection Time: 11/20/18 10:52 AM  Result Value Ref Range   Tricyclic, Ur Screen NONE DETECTED NONE DETECTED   Amphetamines, Ur Screen NONE DETECTED NONE DETECTED   MDMA (Ecstasy)Ur Screen NONE DETECTED NONE DETECTED   Cocaine Metabolite,Ur [place] NONE DETECTED NONE DETECTED    Opiate, Ur Screen POSITIVE (A) NONE DETECTED   Phencyclidine (PCP) Ur S NONE DETECTED NONE DETECTED   Cannabinoid 50 Ng, Ur [place] NONE DETECTED NONE DETECTED   Barbiturates, Ur Screen NONE DETECTED NONE DETECTED   Benzodiazepine, Ur Scrn NONE DETECTED NONE DETECTED   Methadone Scn, Ur NONE DETECTED NONE DETECTED  Glucose, capillary     Status: Abnormal   Collection Time: 11/20/18  1:05 PM  Result Value Ref Range   Glucose-Capillary 140 (H) 70 - 99 mg/dL   *Note: Due to a large number of results and/or encounters for the requested time period, some results have not been displayed. A complete set  of results can be found in Results Review.   ____________________________________________ ____________________________________________  [id number]   ____________________________________________   PROCEDURES  Procedure(s) performed:  Procedures    Critical Care performed: no ____________________________________________   INITIAL IMPRESSION / ASSESSMENT AND PLAN / ED COURSE  Pertinent labs & imaging results that were available during my care of the patient were reviewed by me and considered in my medical decision making (see chart for details).   DDX: Psychosis, delirium, medication effect, noncompliance, polysubstance abuse, Si, Hi, depression   STAYCE DELANCY is a 68 y.o. who presents to the ED with above listed past medical history presents for symptoms as described above in psychiatric evaluation.  Has had multiple medication changes of the past several weeks.  Certainly has some erratic behavior.  Denies any SI or HI.  The patient will be placed on continuous pulse oximetry and telemetry for monitoring.  Laboratory evaluation will be sent to evaluate for the above complaints.    Will consult psychiatry.      The patient was evaluated in Emergency Department today for the symptoms described in the history of present illness. He/she was evaluated in the context of the global  COVID-19 pandemic, which necessitated consideration that the patient might be at risk for infection with the SARS-CoV-2 virus that causes COVID-19. Institutional protocols and algorithms that pertain to the evaluation of patients at risk for COVID-19 are in a state of rapid change based on information released by regulatory bodies including the CDC and federal and state organizations. These policies and algorithms were followed during the patient's care in the ED.  As part of my medical decision making, I reviewed the following data within the [facility] notes reviewed and incorporated, Labs reviewed, notes from prior ED visits and [place] Controlled Substance Database   ____________________________________________   FINAL CLINICAL IMPRESSION(S) / ED DIAGNOSES  Final diagnoses:  Bizarre behavior      NEW MEDICATIONS STARTED DURING THIS VISIT:  New Prescriptions   No medications on file     Note:  This document was prepared using Dragon voice recognition software and may include unintentional dictation errors.    Merlyn Lot, MD 11/20/18 [phone]

## 2018-11-20 NOTE — ED Notes (Signed)
TTS and psychiatry at bedside at this time. Pt repeatedly out from room asking for "I need my little while pill and my insulin before I eat".

## 2018-11-20 NOTE — ED Triage Notes (Signed)
Pt in from [facility] with c/o depression and needing a mental health  eval.

## 2018-11-20 NOTE — ED Notes (Signed)
This RN called again regarding patient's dose of Klonopin to pharmacy, per pharmacy tech, will check into it. This RN reviewed pt's MAR and compared to patient's home medication list sent by [facility]. This RN held medications based off of documented medications in patient's MAR that came from the [facility].

## 2018-11-20 NOTE — ED Notes (Addendum)
Medications administered per MD order. Pt noted to be crying. Pt states "I just don't know what I'm gone do if I have to stay here all night, why don't you know what going on or where I'm going?" This RN explained to patient that psychiatrist had not put in notes yet and that this RN did not know where patient was going. Pt states "I just don't know if I can stay in this hard bed, it's just so hard". This RN explained that there were no other beds available for patient due to her being in the quad. Pt noted to be crying again, this RN asked what she could do to make patient more comfortable. Pt states, "you could have given me a remote", this RN explained that when patient first arrived she was given a remote, asked patient what happened to the remote, pt states she does not know. Pt states "ever since you left I just can't get anyone to help me because you were off the floor nobody would help me". This RN explained that she had to step off the floor for a few minutes and that it was unavoidable, and that this RN also has other patients and is sometimes unable to immediately come to patient when she requests this RN to come to bedside. Pt states understanding.

## 2018-11-20 NOTE — ED Notes (Signed)
This RN to bedside to give patient medications. Pt currently noted to be crying due to cramping in her legs. Attempted to give patient Lyrica as prescribed, pt states, "they took me off that and put me on hydrocodone 4 times a day". This RN explained MD had not ordered hydrocodone and instead ordered Lyrica. Pt adamant that she does not take Lyrica, initially refused it, pt then states, "I'll take it because I need something for my legs because they hurt so bad but I need something else".  This RN explained would speak with MD prior to giving her Lyrica due to patient stating she does not normally take the medication.

## 2018-11-20 NOTE — BH Assessment (Addendum)
Assessment Note  Stephanie [name] is an 68 y.o. female who presents to the ER after the facility's medical care provider ([place]) advised them to have her to come to the ER. Per the facility, [facility] ([place]-[phone]), the patient's behaviors started to change when her providers started adjusting her medications. Patient pain clinic, PCP and Psychiatrist felt the patient was prescribed too many medications and they started the process of tampering her off some of them. Since 11/07/2018, the patient's declined was very noticeable. Her sleep has decreased, she's having some confusion and forgetfulness. She's having increase crying spells. Staff further explain, she could be talking about anything and start crying for no reason. She tells them she's okay but continues to cry.  Per the report of that patient's sister ([phone]), she hasn't had a chance to visit or see her due to the COVID-19 visitation restrictions. However, since the medication changes, the patient has called her numerous times stating she false things. The patient told her, she was in three diabetic commas within the last month. She's calling her ten to thirteen times a day; talking fast, crying and then laughing. Patient sister reports, her current behaviors are similar to when she was hospitalized years ago. "She went in the hospital. They put her on her medicines and she was doing great. I told them (doctors) not to mess with them (medicine), but nobody listens to me. Now she acting just like she was then, all manic and hyper like. One minute she's laughing, next thing you know she's crying and paranoid."  Per the report of the patient, she's doing well and do not know why she was brought to the ER. Patient denies SI/HI and AV/H. However, many times during the interview, she was tearful. When she would minimize it by saying "I wear my feelings on my sleeves." Regardless what was being discussed  and she was tearful. Writer witnessed the patient's interactions with nursing staff. Patient became anxious when asking about her medications, fearful she was getting the wrong medications.  Diagnosis: Bipolar  Past Medical History:  Past Medical History:  Diagnosis Date  . Adenomatous colon polyp 08/27/2014   Polyps x 3  . Anemia   . Anxiety   . 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 Arthritis   . Asthma   . Bipolar affective disorder ([place])    h/o  . Blood transfusion 2000  . Blood transfusion without reported diagnosis   . Cancer ([place])   . Chronic abdominal pain    abdominal wall pain  . 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 Colon polyp   . COPD (chronic obstructive pulmonary disease) ([place])   . Depression   . Diabetes mellitus without complication ([place])   . Fatty liver 04/09/08   found in abd CT  . GERD (gastroesophageal reflux disease)   . H/O: CVA (cardiovascular accident)    TIA  . H/O: rheumatic fever   . Hepatitis    HEP "B" twenty years ago  . Hyperlipidemia   . Hypertension   . IBS (irritable bowel syndrome)   . Incontinence   . Insomnia   . Lower extremity edema   . Neuropathy    lower legs D/T DM  . Obstructive sleep apnea    Use C-PAP twice per week  . Platelets decreased ([place])   . RLS (restless legs syndrome)   . Shortness of breath   . Sleep apnea   .  Stroke [facility])    TIA- 2002  . Ulcer   . Unspecified disorders of nervous system   . Wears dentures    full upper and lower    Past Surgical History:  Procedure Laterality Date  . ABDOMINAL EXPLORATION SURGERY    . ABDOMINAL HYSTERECTOMY     total  . Axillary artery cannulation via 8-mm Hemashield graft, median sternotomy, extracorporeal circulation with deep hypothermic circulatory arrest, repair of  aortic dessection  01/23/2009   Dr Arlyce Dice  . BACK SURGERY      x 5  . cataract     both eyes  . CHOLECYSTECTOMY    . COLONOSCOPY    . EYE SURGERY Bilateral    Cataract Extraction with IOL  .  FACIAL LACERATION REPAIR Left 04/11/2018   Procedure: EARLOBE REPAIR;  Surgeon: Carloyn Manner, MD;  Location: [place];  Service: ENT;  Laterality: Left;  Diabetic - insulin and oral meds sleep apnea  . HEMORROIDECTOMY     and colon polyp removed  . KNEE ARTHROSCOPY WITH LATERAL RELEASE Right 11/15/2016   Procedure: KNEE ARTHROSCOPY WITH LATERAL RELEASE;  Surgeon: Corky Mull, MD;  Location: [facility];  Service: Orthopedics;  Laterality: Right;  . LIVER BIOPSY  04/10/2012   Procedure: LIVER BIOPSY;  Surgeon: Inda Castle, MD;  Location: [facility];  Service: Endoscopy;  Laterality: N/A;  ultrasound to mark order for abd limited/liver to be marked to be sent by [email or web address]   . LUMBAR FUSION  10/09  . LUMBAR WOUND DEBRIDEMENT  05/09/2011   Procedure: LUMBAR WOUND DEBRIDEMENT;  Surgeon: Dahlia Bailiff;  Location: [place];  Service: Orthopedics;  Laterality: N/A;  IRRIGATION AND DEBRIDEMENT SPINAL WOUND  . POSTERIOR CERVICAL FUSION/FORAMINOTOMY    . ROTATOR CUFF REPAIR     left    Family History:  Family History  Problem Relation Age of Onset  . Breast cancer Mother   . Lung cancer Mother   . Stomach cancer Father   . Diabetes Other        4 aunts, and 1 uncle  . Depression Brother   . Anxiety disorder Brother   . Colon cancer Neg Hx   . Esophageal cancer Neg Hx   . Rectal cancer Neg Hx     Social History:  reports that she quit smoking about 5 years ago. Her smoking use included cigarettes. She has a 75.00 pack-year smoking history. She has never used smokeless tobacco. She reports that she does not drink alcohol or use drugs.  Additional Social History:  Alcohol / Drug Use Pain Medications: See PTA Prescriptions: See PTA Over the Counter: See PTA History of alcohol / drug use?: Yes Longest period of sobriety (when/how long): Unable to quantify  CIWA: CIWA-Ar BP: 139/71 Pulse Rate: 79 COWS:    Allergies:  Allergies  Allergen Reactions  . Cortisone      Increased BS  . Doxycycline Hives, Swelling, Other (See Comments) and Nausea Only    Reaction:  Facial swelling  Face red and swollen; no difficulty breathing    Home Medications: (Not in a hospital admission)   OB/GYN Status:  No LMP recorded. Patient has had a hysterectomy.  General Assessment Data Location of Assessment: [facility] ED TTS Assessment: In system Is this a Tele or Face-to-Face Assessment?: Face-to-Face Is this an Initial Assessment or a Re-assessment for this encounter?: Initial Assessment Patient Accompanied by:: N/A Language Other than English: No Living Arrangements: In Assisted Living/Nursing Home (Comment: Name of Nursing  Home([facility]) What gender do you identify as?: Female Marital status: Single Pregnancy Status: No Living Arrangements: Other (Comment) Can pt return to current living arrangement?: Yes Admission Status: Voluntary Is patient capable of signing voluntary admission?: Yes Referral Source: Self/Family/Friend Insurance type: [facility] MCR  Medical Screening Exam ([place]) Medical Exam completed: Yes  Crisis Care Plan Living Arrangements: Other (Comment) Legal Guardian: Other:(Self) Name of Psychiatrist: Dr. Eappen([facility]) Name of Therapist: Reports of none  Education Status Is patient currently in school?: No Is the patient employed, unemployed or receiving disability?: Unemployed  Risk to self with the past 6 months Suicidal Ideation: No Has patient been a risk to self within the past 6 months prior to admission? : No Suicidal Intent: No Has patient had any suicidal intent within the past 6 months prior to admission? : No Is patient at risk for suicide?: No Suicidal Plan?: No Has patient had any suicidal plan within the past 6 months prior to admission? : No Access to Means: No What has been your use of drugs/alcohol within the last 12 months?: History of pain pill abuse Previous  Attempts/Gestures: No How many times?: 0 Other Self Harm Risks: Reports of none Triggers for Past Attempts: None known Intentional Self Injurious Behavior: None Family Suicide History: No Recent stressful life event(s): Other (Comment)(Recent medication adjustments) Persecutory voices/beliefs?: No Depression: Yes Depression Symptoms: Insomnia, Tearfulness, Isolating, Fatigue, Guilt, Loss of interest in usual pleasures, Feeling worthless/self pity Substance abuse history and/or treatment for substance abuse?: Yes Suicide prevention information given to non-admitted patients: Not applicable  Risk to Others within the past 6 months Homicidal Ideation: No Does patient have any lifetime risk of violence toward others beyond the six months prior to admission? : No Thoughts of Harm to Others: No Current Homicidal Intent: No Current Homicidal Plan: No Access to Homicidal Means: No Identified Victim: Reports of none History of harm to others?: No Assessment of Violence: None Noted Violent Behavior Description: Reports of none Does patient have access to weapons?: No Criminal Charges Pending?: No Does patient have a court date: No Is patient on probation?: No  Psychosis Hallucinations: None noted Delusions: None noted  Mental Status Report Appearance/Hygiene: Unremarkable, In scrubs Eye Contact: Good Motor Activity: Freedom of movement, Unremarkable Speech: Logical/coherent, Unremarkable Level of Consciousness: Alert Mood: Anxious, Depressed, Sad, Pleasant Affect: Appropriate to circumstance, Sad, Depressed Anxiety Level: Minimal Thought Processes: Coherent, Relevant Judgement: Partial Orientation: Person, Place, Time, Situation, Appropriate for developmental age Obsessive Compulsive Thoughts/Behaviors: Minimal  Cognitive Functioning Concentration: Decreased Memory: Recent Intact, Remote Intact Is patient IDD: No Insight: Fair Impulse Control: Fair Appetite: Good Have you  had any weight changes? : No Change Sleep: No Change Total Hours of Sleep: 8 Vegetative Symptoms: None  ADLScreening [facility] Assessment Services) Patient's cognitive ability adequate to safely complete daily activities?: Yes Patient able to express need for assistance with ADLs?: Yes Independently performs ADLs?: Yes (appropriate for developmental age)  Prior Inpatient Therapy Prior Inpatient Therapy: Yes Prior Therapy Dates: 2001 Prior Therapy Facilty/Provider(s): [facility] Reason for Treatment: Bipolar  Prior Outpatient Therapy Prior Outpatient Therapy: Yes Prior Therapy Dates: Current Prior Therapy Facilty/Provider(s): [facility]-Dr. Shea Evans Reason for Treatment: Bipolar Does patient have an ACCT team?: No Does patient have Intensive In-House Services?  : No Does patient have [facility] services? : No Does patient have P4CC services?: No  ADL Screening (condition at time of admission) Patient's cognitive ability adequate to safely complete daily activities?: Yes Is  the patient deaf or have difficulty hearing?: No Does the patient have difficulty seeing, even when wearing glasses/contacts?: No Does the patient have difficulty concentrating, remembering, or making decisions?: No Patient able to express need for assistance with ADLs?: Yes Does the patient have difficulty dressing or bathing?: No Independently performs ADLs?: Yes (appropriate for developmental age) Does the patient have difficulty walking or climbing stairs?: No Weakness of Legs: None Weakness of Arms/Hands: None  Home Assistive Devices/Equipment Home Assistive Devices/Equipment: None  Therapy Consults (therapy consults require a physician order) PT Evaluation Needed: No OT Evalulation Needed: No SLP Evaluation Needed: No Abuse/Neglect Assessment (Assessment to be complete while patient is alone) Abuse/Neglect Assessment Can Be Completed: Yes Physical Abuse: Denies Verbal Abuse:  Denies Sexual Abuse: Denies Exploitation of patient/patient's resources: Denies Self-Neglect: Denies Values / Beliefs Cultural Requests During Hospitalization: None Spiritual Requests During Hospitalization: None Consults Spiritual Care Consult Needed: No Social Work Consult Needed: No         Child/Adolescent Assessment Running Away Risk: Denies(Patient is an adult)  Disposition:  Disposition Initial Assessment Completed for this Encounter: Yes  On Site Evaluation by:   Reviewed with Physician:    Gunnar Fusi MS, LCAS, [facility], [place], [place] Therapeutic Triage Specialist 11/20/2018 5:35 PM

## 2018-11-20 NOTE — ED Notes (Signed)
Pt dressed out by this RN and Dorian, Therapist, sports. Multiple grey ear rings, bracelets, and rings placed in clear bag and specimen cup. 1 shirt, 1 bra, 1 pair of pants, 1 pair of underwear, 1 pair of socks, 1 pair of white tennis shoes placed in patient belongings bag.

## 2018-11-20 NOTE — ED Notes (Signed)
Snack and beverage given. 

## 2018-11-20 NOTE — ED Notes (Signed)
Pt repeatededly callling out while this Rn with another patient. This Rn explained to patient that she could not call out and interrupt this RN while she is with another patient. Pt requesting a phone.

## 2018-11-20 NOTE — ED Notes (Signed)
This RN called pharmacy and spoke with Jarrett Soho, Pharmacist regarding insulin. Pt's med rec for 8 units Humalog kwikpen, per [place] no humalog in formulary, substitute for 8 units Novolog.

## 2018-11-20 NOTE — BH Assessment (Signed)
Referral information for Psychiatric Hospitalization faxed to;   Marland Kitchen Cristal Ford [phone]),   . [facility] ([phone]--[phone]  . [facility](-[phone]- [phone] . [place] ([phone]),  . Fo[facility]ikel [phone]   . [phone]   . [facility] ([phone])  . [place] [phone]   . [facility] [phone]

## 2018-11-20 NOTE — ED Notes (Signed)
Report to include Situation, Background, Assessment, and Recommendations received from [facility]. Patient alert and oriented, warm and dry, in no acute distress. Patient denies SI, HI, AVH and pain. Patient made aware of Q15 minute rounds and Engineer, drilling presence for their safety. Patient instructed to come to me with needs or concerns.

## 2018-11-21 DIAGNOSIS — F319 Bipolar disorder, unspecified: Secondary | ICD-10-CM | POA: Diagnosis not present

## 2018-11-21 DIAGNOSIS — M4802 Spinal stenosis, cervical region: Secondary | ICD-10-CM | POA: Diagnosis not present

## 2018-11-21 DIAGNOSIS — I5032 Chronic diastolic (congestive) heart failure: Secondary | ICD-10-CM | POA: Diagnosis not present

## 2018-11-21 DIAGNOSIS — F413 Other mixed anxiety disorders: Secondary | ICD-10-CM | POA: Diagnosis not present

## 2018-11-21 DIAGNOSIS — F3112 Bipolar disorder, current episode manic without psychotic features, moderate: Secondary | ICD-10-CM | POA: Diagnosis not present

## 2018-11-21 LAB — GLUCOSE, CAPILLARY
Glucose-Capillary: 120 mg/dL — ABNORMAL HIGH (ref 70–99)
Glucose-Capillary: 151 mg/dL — ABNORMAL HIGH (ref 70–99)

## 2018-11-21 NOTE — ED Notes (Signed)
EMTALA verified by this RN.

## 2018-11-21 NOTE — ED Notes (Signed)
[facility]  DEPT  CALLED  FOR TRANSPORT

## 2018-11-21 NOTE — ED Notes (Signed)
Hourly rounding reveals patient sleeping in room. No complaints, stable, in no acute distress. Q15 minute rounds and monitoring via Engineer, drilling to continue.

## 2018-11-21 NOTE — BH Assessment (Signed)
Patient has been accepted to [facility].  Patient assigned to Geriatric Psych Unit Accepting physician is Dr. Brantley Fling.  Call report to [phone].  Representative was [employer].   ER Staff is aware of it:  Anne Ng, ER Secretary  Dr. Cherylann Banas, ER MD Berton Lan, Patient's Nurse  Patient's sister ([phone]) have been updated as well. [facility] ([place]-[phone]) is aware of the admission and they will save her beds.  Address:  [address],  [place], [place] [address]

## 2018-11-21 NOTE — ED Notes (Signed)
Meal was given to patient with a warm wash cloth to wash face and hands.

## 2018-11-21 NOTE — BH Assessment (Signed)
Writer followed up with referral information;   Stephanie [name] [phone]), Unable to reach anyone   Stephanie [name] ([phone]--[phone], Unable to reach anyone   [name]) Wait List   Stephanie [name]([phone], Left message for return phone call.   Stephanie [name]([phone] [phone],    Stephanie [name](Stephanie [name]), denied due to medical acuity.   Stephanie [name] (Stephanie [name]-[phone] or [phone]), Information refaxed.   [place] (Stephanie [name] or [phone], Wait List   Stephanie [name], left a message for return phone call.

## 2018-11-21 NOTE — ED Notes (Signed)
Hourly rounding reveals patient in room. No complaints, stable, in no acute distress. Q15 minute rounds and monitoring via Engineer, drilling to continue.

## 2018-11-21 NOTE — ED Notes (Signed)
Pt went to restroom, pt using walking assistive device

## 2018-11-21 NOTE — Consult Note (Signed)
[facility] Face-to-Face Psychiatry Consult follow-up  Reason for Consult: Increasing anxiety and bipolar mania after medication changes. Referring Physician: Dr. Quentin Cornwall Patient Identification: Stephanie [name] MRN:  [id number] Principal Diagnosis: Bipolar 1 disorder, manic, moderate ([place]) Diagnosis:   Patient Active Problem List   Diagnosis Date Noted  . Bipolar 1 disorder, manic, moderate ([place]) [F31.12] 11/20/2018  . Tremor [R25.1] 10/03/2018  . Loss of memory [R41.3] 07/24/2018  . Numbness and tingling of both legs [R20.0, R20.2] 07/24/2018  . Obesity (BMI 30-39.9) [E66.9] 07/19/2018  . Chronic diastolic CHF (congestive heart failure), NYHA class 2 ([place]) [I50.32] 07/17/2018  . Mild mitral regurgitation [I34.0] 07/05/2018  . Unsteady gait [R26.81] 04/09/2018  . Diabetic peripheral neuropathy associated with type 2 diabetes mellitus ([place]) [E11.42] 04/09/2018  . Laceration without foreign body, right lower leg, initial encounter [S81.811A] 04/09/2018  . Chronic obstructive pulmonary disease, unspecified ([place]) [J44.9] 04/09/2018  . Unilateral primary osteoarthritis, right knee [M17.11] 04/09/2018  . Sleep apnea [G47.30] 04/09/2018  . Chronic hip pain, right [M25.551, G89.29] 04/09/2018  . Closed impacted fracture of hip ([place]) [S72.099A] 04/09/2018  . Tricompartment osteoarthritis of right knee [M17.11] 10/18/2017  . Sepsis ([place]) [A41.9] 07/20/2017  . Obesity, Class III, BMI 40-49.9 (morbid obesity) ([place]) [E66.01] 05/19/2017  . Cervical stenosis of spine [M48.02] 04/25/2017  . Stress fracture of femur [G64.403K] 04/17/2017  . Elevated alkaline phosphatase level [R74.8] 04/16/2017  . Altered mental status [R41.82] 03/07/2017  . Degenerative tear of lateral meniscus of right knee [M23.300] 11/15/2016  . Encephalopathy [G93.40] 07/19/2016  . Degenerative tear of medial meniscus of right knee [M23.203] 07/01/2016  . Primary osteoarthritis of right knee [M17.11] 07/01/2016  . Diabetes mellitus  type 1, uncontrolled, without complications ([place]) [V42.59] 06/20/2016  . Lymphedema [I89.0] 06/14/2016  . Pain in limb [M79.609] 05/06/2016  . Swelling of limb [M79.89] 05/06/2016  . Postphlebitic syndrome with inflammation [I87.029] 05/06/2016  . Pneumonia [J18.9] 04/18/2016  . Chest pain [R07.9] 04/04/2016  . Iron deficiency anemia [D50.9] 01/30/2016  . Bilateral carotid artery stenosis [I65.23] 01/12/2016  . Hypoglycemia [E16.2] 09/09/2015  . Hyperglycemia [R73.9] 07/16/2015  . Neuropathy [G62.9] 06/08/2015  . Thrombocytopenia ([place]) [D69.6] 08/19/2014  . Abdominal pain, chronic, epigastric [R10.13, G89.29] 06/12/2014  . Liver cirrhosis secondary to NASH ([place]) [K75.81, K74.60] 02/27/2014  . Dizziness and giddiness [R42] 10/30/2013  . DOE (dyspnea on exertion) [R06.09] 10/30/2013  . Poorly controlled type 2 diabetes mellitus with complication ([place]) [D63.8, E11.65] 10/14/2013  . DNR (do not resuscitate) [Z66] 07/29/2013  . Encounter for routine gynecological examination [Z01.419] 07/29/2013  . Dyspnea [R06.00] 07/10/2013  . Lung nodule [R91.1] 07/10/2013  . Varicose veins of left lower extremity [I83.92] 10/09/2012  . Gastroesophageal reflux disease [K21.9] 10/09/2012  . Lower extremity edema [R60.0] 02/02/2012  . Cirrhosis of liver ([place]) [K74.60] 02/02/2012  . Anxiety disorder [F41.9] 07/11/2011  . Aortic dissection ([place]) [I71.00]   . BACK PAIN, LUMBAR, CHRONIC [M54.5] 11/19/2009  . Chronic pain syndrome [G89.4] 11/06/2009  . UNS ADVRS EFF OTH RX MEDICINAL&BIOLOGICAL SBSTNC [T50.995A] 12/16/2008  . Irritable bowel syndrome with constipation [K58.1] 10/23/2008  . UNSPECIFIED VITAMIN D DEFICIENCY [E55.9] 09/26/2008  . TOBACCO ABUSE [F17.200] 06/19/2008  . EDEMA, LOCALIZED [R60.9] 01/07/2008  . Restless legs syndrome [G25.81] 04/27/2007  . INSOMNIA [G47.00] 04/27/2007  . Hyperlipidemia [E78.5] 03/08/2007  . OSA (obstructive sleep apnea) [G47.33] 03/08/2007  . Hypertension [I10]  03/08/2007  . Asthma [J45.909] 03/08/2007  . Urinary incontinence [R32] 03/08/2007  . BIPOLAR AFFECTIVE DISORDER, HX OF [Z86.59] 03/08/2007  11/20/2018  collateral obtained from patient's sister Audree Bane [phone]) and from patient's ALF, [facility]   Total Time spent with patient: 25 minutes Patient is seen, chart is reviewed.   Subjective: " I really need pain medicine for my legs and knees."  From initial psychiatric intake 11/20/2018: HPI: Stephanie [name] is a 68 y.o. female patient with extensive past medical history presents from nursing facility for evaluation of irrational behavior tearfulness significant mood swings after she was recently taken off of her benzodiazepine. She denies any SI or HI.  Denies any pain.  No hallucinations.  Upon initial psychiatric evaluation, patient is seen pacing in the hallways.  She requires redirection to stay in her room.  Patient expresses that she is very anxious and having pain.  She would like her hydrocodone and Klonopin prescribed.  She also notes that, "I take a little white pill before my meals.  I have to have that pill arrival go back into a diabetic coma."  Patient is provided with reassurance that her medication reconciliation will be done, and patient is redirectable.  Patient describes that her medications have been changed by her primary care provider Arville Care, MD at her ALF along with her pain doctor, Dr. Primus Bravo).  She reports that since they have begun making changes, she has been crying all the time, however she denies being depressed.  She states that she has been having difficulty sleeping, she feels agitated and restless.  She reports that she believes her restless leg medicine has also been stopped, because she is having a difficult time keeping herself from needing to walk.  Patient speech is pressured during conversation.  She is anxious about getting her medications back the way they were.  She reports that the last time she  had a psychiatric admission was in 2001 when she was admitted to [facility].  She reports that since that time she had been stable until recently but she cannot recall all of the medication she is on.  She states understanding that her doctors were wanting to decrease her medication load, however she believes that they have stopped the wrong medicines.  Patient is denying any suicidal or homicidal ideation.  She is currently denying any auditory or visual hallucinations.  Patient displays mood lability during interview and is tearful during assessment for brief moments.  Collateral is obtained from patient's sister Audree Bane [phone]): Sister relates that due to COVID-19 she has not been able to visit her sister in the nursing home, however she has noticed over the past month patient has been telling her that she is not eating because she is afraid of COVID-19.  She reports that sister believes that she was told she was not a diabetic however also tells her that she has been in 3 diabetic comas.  Sister reports that she will continuously call her by phone up to 15 times in a row.  She describes that she will be laughing and then crying, speaking quickly and rapidly changing topics during conversation.  Sister expresses understanding that patient is been on a lot of medications, and describes her sister as manipulative.  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  Sister notes that patient has been stable since her hospitalization at [facility] in 2001 up until 6 months ago when her medications began to  be adjusted.  She notes that sister does have a history of psychosis along with her bipolar disorder and is concerned that if patient's medications are not stabilized she will continue to decompensate.   Collateral from care provider at the [facility], [place]:   Patient historically has not had any behavioral problems.  They note that patient has been seeing a pain provider with a plan to stop opiates.  Unfortunately, after opiates were stopped patient became very agitated to the point that opiates were restarted.  Since that time, the providers have attempted to stop her benzodiazepines to which patient has had increased anxiety, poor sleep poor appetite increased pacing and increased agitation.  She has not harmed anybody.  She has not endorsed any intent of self-harm.  She is welcome to return to the facility once her medications have been re-stabilized.    Per record review with updates: Social history: Lives at the [facility].  Sister, Audree Bane is involved in patient's care.  Medical history: Multiple medical problems, see problem list.  Very wide array of problems going back years including head injuries.  Substance abuse history: History of opiate addiction/medication seeking behavior for the past 15 years  Past Psychiatric History: Bipolar 1, generalized anxiety disorder, insomnia; opiate use disorder Patient has at least 1 past psychiatric hospitalization in 2001 at [facility]. Per record review, patient has been on Abilify up to 30 mg in the past with psychotic disorder.  On reevaluation 11/21/2018: Patient continues to present in hypomanic state.  She is perseverative about pain to her knees.  Patient recalls her worst manic episode having psychotic features with hallucinations at which time she was hospitalized at [facility].  She reports that she feels unstable with the medication changes, however she has not developed any auditory or visual hallucinations.  She remains anxious and slightly agitated and restless.  She has not had any behavioral issues.  She is denying suicidal and homicidal ideation and is hoping that her medications can be corrected.  She has been advised that she will be transferred to a geriatric inpatient psychiatry unit and she is  agreeable.   Risk to Self: Suicidal Ideation: No Suicidal Intent: No Is patient at risk for suicide?: No Suicidal Plan?: No Access to Means: No What has been your use of drugs/alcohol within the last 12 months?: History of pain pill abuse How many times?: 0 Other Self Harm Risks: Reports of none Triggers for Past Attempts: None known Intentional Self Injurious Behavior: NoneDenies Risk to Others: Homicidal Ideation: No Thoughts of Harm to Others: No Current Homicidal Intent: No Current Homicidal Plan: No Access to Homicidal Means: No Identified Victim: Reports of none History of harm to others?: No Assessment of Violence: None Noted Violent Behavior Description: Reports of none Does patient have access to weapons?: No Criminal Charges Pending?: No Does patient have a court date: NoDenies Prior Inpatient Therapy: Prior Inpatient Therapy: Yes Prior Therapy Dates: 2001 Prior Therapy Facilty/Provider(s): [facility] Reason for Treatment: BipolarYes, last in 2001 at [facility] Prior Outpatient Therapy: Prior Outpatient Therapy: Yes Prior Therapy Dates: Current Prior Therapy Facilty/Provider(s): [facility]-Dr. Shea Evans Reason for Treatment: Bipolar Does patient have an ACCT team?: No Does patient have Intensive In-House Services?  : No Does patient have [facility] services? : No Does patient have P4CC services?: NoYes, current with Dr. Shea Evans, last visit 11/13/2018.  Past Medical History:  Past Medical History:  Diagnosis Date  . Adenomatous colon polyp 08/27/2014   Polyps x 3  .  Anemia   . Anxiety   . 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 Arthritis   . Asthma   . Bipolar affective disorder ([place])    h/o  . Blood transfusion 2000  . Blood transfusion without reported diagnosis   . Cancer ([place])   . Chronic abdominal pain    abdominal wall pain  . Cirrhosis ([place])    related to NASH  . Colon polyp   . COPD (chronic obstructive pulmonary  disease) ([place])   . Depression   . Diabetes mellitus without complication ([place])   . Fatty liver 04/09/08   found in abd CT  . GERD (gastroesophageal reflux disease)   . H/O: CVA (cardiovascular accident)    TIA  . H/O: rheumatic fever   . Hepatitis    HEP "B" twenty years ago  . Hyperlipidemia   . Hypertension   . IBS (irritable bowel syndrome)   . Incontinence   . Insomnia   . Lower extremity edema   . Neuropathy    lower legs D/T DM  . Obstructive sleep apnea    Use C-PAP twice per week  . Platelets decreased ([place])   . RLS (restless legs syndrome)   . Shortness of breath   . Sleep apnea   . Stroke [facility])    TIA- 2002  . Ulcer   . Unspecified disorders of nervous system   . Wears dentures    full upper and lower    Past Surgical History:  Procedure Laterality Date  . ABDOMINAL EXPLORATION SURGERY    . ABDOMINAL HYSTERECTOMY     total  . Axillary artery cannulation via 8-mm Hemashield graft, median sternotomy, extracorporeal circulation with deep hypothermic circulatory arrest, repair of  aortic dessection  01/23/2009   Dr Arlyce Dice  . BACK SURGERY      x 5  . cataract     both eyes  . CHOLECYSTECTOMY    . COLONOSCOPY    . EYE SURGERY Bilateral    Cataract Extraction with IOL  . FACIAL LACERATION REPAIR Left 04/11/2018   Procedure: EARLOBE REPAIR;  Surgeon: Carloyn Manner, MD;  Location: [facility];  Service: ENT;  Laterality: Left;  Diabetic - insulin and oral meds sleep apnea  . HEMORROIDECTOMY     and colon polyp removed  . KNEE ARTHROSCOPY WITH LATERAL RELEASE Right 11/15/2016   Procedure: KNEE ARTHROSCOPY WITH LATERAL RELEASE;  Surgeon: Corky Mull, MD;  Location: [facility];  Service: Orthopedics;  Laterality: Right;  . LIVER BIOPSY  04/10/2012   Procedure: LIVER BIOPSY;  Surgeon: Inda Castle, MD;  Location: [facility];  Service: Endoscopy;  Laterality: N/A;  ultrasound to mark order for abd limited/liver to be marked to be sent by [email or web address]    . LUMBAR FUSION  10/09  . LUMBAR WOUND DEBRIDEMENT  05/09/2011   Procedure: LUMBAR WOUND DEBRIDEMENT;  Surgeon: Dahlia Bailiff;  Location: [facility];  Service: Orthopedics;  Laterality: N/A;  IRRIGATION AND DEBRIDEMENT SPINAL WOUND  . POSTERIOR CERVICAL FUSION/FORAMINOTOMY    . ROTATOR CUFF REPAIR     left   Family History:  Family History  Problem Relation Age of Onset  . Breast cancer Mother   . Lung cancer Mother   . Stomach cancer Father   . Diabetes Other        4 aunts, and 1 uncle  . Depression Brother   . Anxiety disorder Brother   . Colon cancer Neg Hx   . Esophageal  cancer Neg Hx   . Rectal cancer Neg Hx    Family Psychiatric  History: Patient does not know of any   Social History:  Social History   Substance and Sexual Activity  Alcohol Use No  . Alcohol/week: 0.0 standard drinks     Social History   Substance and Sexual Activity  Drug Use No    Social History   Socioeconomic History  . Marital status: Divorced    Spouse name: Not on file  . Number of children: 2  . Years of education: Not on file  . Highest education level: 8th grade  Occupational History  . Occupation: Disabled    Employer: [employer]  Social Needs  . Financial resource strain: Not hard at all  . Food insecurity    Worry: Never true    Inability: Never true  . Transportation needs    Medical: No    Non-medical: No  Tobacco Use  . Smoking status: Former Smoker    Packs/day: 1.50    Years: 50.00    Pack years: 75.00    Types: Cigarettes    Quit date: 04/06/2013    Years since quitting: 5.6  . Smokeless tobacco: Never Used  Substance and Sexual Activity  . Alcohol use: No    Alcohol/week: 0.0 standard drinks  . Drug use: No  . Sexual activity: Never  Lifestyle  . Physical activity    Days per week: 0 days    Minutes per session: 0 min  . Stress: Very much  Relationships  . Social Herbalist on phone: Not on file    Gets together: Not on file    Attends  religious service: Never    Active member of club or organization: No    Attends meetings of clubs or organizations: Never    Relationship status: Divorced  Other Topics Concern  . Not on file  Social History Narrative   1 son, 29+ y/o   Daily caffeine use: 2 cups daily   Does not get regular exercise   Disability due to bipolar      Has a living will- she is a DNR (she has form at home per pt).   Additional Social History:  Lives at the [facility]  Son died 3 to 4 years ago in a truck accident (2016 2017)  Allergies:   Allergies  Allergen Reactions  . Cortisone     Increased BS  . Doxycycline Hives, Swelling, Other (See Comments) and Nausea Only    Reaction:  Facial swelling  Face red and swollen; no difficulty breathing    Labs:  Results for orders placed or performed during the hospital encounter of 11/20/18 (from the past 48 hour(s))  Comprehensive metabolic panel     Status: Abnormal   Collection Time: 11/20/18 10:52 AM  Result Value Ref Range   Sodium 138 135 - 145 mmol/L   Potassium 4.2 3.5 - 5.1 mmol/L   Chloride 102 98 - 111 mmol/L   CO2 23 22 - 32 mmol/L   Glucose, Bld 198 (H) 70 - 99 mg/dL   BUN 13 8 - 23 mg/dL   Creatinine, Ser 1.16 (H) 0.44 - 1.00 mg/dL   Calcium 9.0 8.9 - 10.3 mg/dL   Total Protein 7.1 6.5 - 8.1 g/dL   Albumin 3.9 3.5 - 5.0 g/dL   AST 22 15 - 41 U/L   ALT 16 0 - 44 U/L   Alkaline Phosphatase 164 (H) 38 -  126 U/L   Total Bilirubin 0.5 0.3 - 1.2 mg/dL   GFR calc non Af Amer 48 (L) >60 mL/min   GFR calc Af Amer 56 (L) >60 mL/min   Anion gap 13 5 - 15    Comment: Performed at [facility], [address] [place], [place] [address]  Ethanol     Status: None   Collection Time: 11/20/18 10:52 AM  Result Value Ref Range   Alcohol, Ethyl (B) <10 <10 mg/dL    Comment: (NOTE) Lowest detectable limit for serum alcohol is 10 mg/dL. For medical purposes only. Performed at [facility], [address] [place], [place]  [address]   Salicylate level     Status: None   Collection Time: 11/20/18 10:52 AM  Result Value Ref Range   Salicylate Lvl <8.8 2.8 - 30.0 mg/dL    Comment: Performed at [facility], [address] [place], [place] [address]  Acetaminophen level     Status: Abnormal   Collection Time: 11/20/18 10:52 AM  Result Value Ref Range   Acetaminophen (Tylenol), Serum <10 (L) 10 - 30 ug/mL    Comment: (NOTE) Therapeutic concentrations vary significantly. A range of 10-30 ug/mL  may be an effective concentration for many patients. However, some  are best treated at concentrations outside of this range. Acetaminophen concentrations >150 ug/mL at 4 hours after ingestion  and >50 ug/mL at 12 hours after ingestion are often associated with  toxic reactions. Performed at [facility], [address] [place], [place] [address]   cbc     Status: Abnormal   Collection Time: 11/20/18 10:52 AM  Result Value Ref Range   WBC 7.3 4.0 - 10.5 K/uL   RBC 3.82 (L) 3.87 - 5.11 MIL/uL   Hemoglobin 11.7 (L) 12.0 - 15.0 g/dL   HCT 35.5 (L) 36.0 - 46.0 %   MCV 92.9 80.0 - 100.0 fL   MCH 30.6 26.0 - 34.0 pg   MCHC 33.0 30.0 - 36.0 g/dL   RDW 16.4 (H) 11.5 - 15.5 %   Platelets 102 (L) 150 - 400 K/uL    Comment: Immature Platelet Fraction may be clinically indicated, consider ordering this additional test [id number]    nRBC 0.0 0.0 - 0.2 %    Comment: Performed at [facility], [address] [place], [place] [address]  Hemoglobin A1c     Status: None   Collection Time: 11/20/18 10:52 AM  Result Value Ref Range   Hgb A1c MFr Bld 5.6 4.8 - 5.6 %    Comment: (NOTE) Pre diabetes:          5.7%-6.4% Diabetes:              >6.4% Glycemic control for   <7.0% adults with diabetes    Mean Plasma Glucose 114.02 mg/dL    Comment: Performed at [facility] [address] [place], [place] [address]  Urinalysis, Routine w reflex microscopic     Status: Abnormal   Collection  Time: 11/20/18 10:52 AM  Result Value Ref Range   Color, Urine STRAW (A) YELLOW   APPearance CLEAR (A) CLEAR   Specific Gravity, Urine 1.006 1.005 - 1.030   pH 7.0 5.0 - 8.0   Glucose, UA 50 (A) NEGATIVE mg/dL   Hgb urine dipstick NEGATIVE NEGATIVE   Bilirubin Urine NEGATIVE NEGATIVE   Ketones, ur NEGATIVE NEGATIVE mg/dL   Protein, ur NEGATIVE NEGATIVE mg/dL   Nitrite NEGATIVE NEGATIVE  Leukocytes,Ua NEGATIVE NEGATIVE    Comment: Performed at [facility], [address] [place], [place] [address]  Urine Drug Screen, Qualitative [facility] only)     Status: Abnormal   Collection Time: 11/20/18 10:52 AM  Result Value Ref Range   Tricyclic, Ur Screen NONE DETECTED NONE DETECTED   Amphetamines, Ur Screen NONE DETECTED NONE DETECTED   MDMA (Ecstasy)Ur Screen NONE DETECTED NONE DETECTED   Cocaine Metabolite,Ur Onset NONE DETECTED NONE DETECTED   Opiate, Ur Screen POSITIVE (A) NONE DETECTED   Phencyclidine (PCP) Ur S NONE DETECTED NONE DETECTED   Cannabinoid 50 Ng, Ur [place] NONE DETECTED NONE DETECTED   Barbiturates, Ur Screen NONE DETECTED NONE DETECTED   Benzodiazepine, Ur Scrn NONE DETECTED NONE DETECTED   Methadone Scn, Ur NONE DETECTED NONE DETECTED    Comment: (NOTE) Tricyclics + metabolites, urine    Cutoff 1000 ng/mL Amphetamines + metabolites, urine  Cutoff 1000 ng/mL MDMA (Ecstasy), urine              Cutoff 500 ng/mL Cocaine Metabolite, urine          Cutoff 300 ng/mL Opiate + metabolites, urine        Cutoff 300 ng/mL Phencyclidine (PCP), urine         Cutoff 25 ng/mL Cannabinoid, urine                 Cutoff 50 ng/mL Barbiturates + metabolites, urine  Cutoff 200 ng/mL Benzodiazepine, urine              Cutoff 200 ng/mL Methadone, urine                   Cutoff 300 ng/mL The urine drug screen provides only a preliminary, unconfirmed analytical test result and should not be used for non-medical purposes. Clinical consideration and professional judgment should be applied  to any positive drug screen result due to possible interfering substances. A more specific alternate chemical method must be used in order to obtain a confirmed analytical result. Gas chromatography / mass spectrometry (GC/MS) is the preferred confirmat ory method. Performed at [facility], [address] [place], [place] [address]   Glucose, capillary     Status: Abnormal   Collection Time: 11/20/18  1:05 PM  Result Value Ref Range   Glucose-Capillary 140 (H) 70 - 99 mg/dL  SARS Coronavirus 2 [facility] order, Performed in [facility] hospital lab)     Status: None   Collection Time: 11/20/18  3:16 PM  Result Value Ref Range   SARS Coronavirus 2 NEGATIVE NEGATIVE    Comment: (NOTE) If result is NEGATIVE SARS-CoV-2 target nucleic acids are NOT DETECTED. The SARS-CoV-2 RNA is generally detectable in upper and lower  respiratory specimens during the acute phase of infection. The lowest  concentration of SARS-CoV-2 viral copies this assay can detect is 250  copies / mL. A negative result does not preclude SARS-CoV-2 infection  and should not be used as the sole basis for treatment or other  patient management decisions.  A negative result may occur with  improper specimen collection / handling, submission of specimen other  than nasopharyngeal swab, presence of viral mutation(s) within the  areas targeted by this assay, and inadequate number of viral copies  (<250 copies / mL). A negative result must be combined with clinical  observations, patient history, and epidemiological information. If result is POSITIVE SARS-CoV-2 target nucleic acids are DETECTED. The SARS-CoV-2 RNA is generally detectable in upper and  lower  respiratory specimens dur ing the acute phase of infection.  Positive  results are indicative of active infection with SARS-CoV-2.  Clinical  correlation with patient history and other diagnostic information is  necessary to determine patient infection  status.  Positive results do  not rule out bacterial infection or co-infection with other viruses. If result is PRESUMPTIVE POSTIVE SARS-CoV-2 nucleic acids MAY BE PRESENT.   A presumptive positive result was obtained on the submitted specimen  and confirmed on repeat testing.  While 2019 novel coronavirus  (SARS-CoV-2) nucleic acids may be present in the submitted sample  additional confirmatory testing may be necessary for epidemiological  and / or clinical management purposes  to differentiate between  SARS-CoV-2 and other Sarbecovirus currently known to infect humans.  If clinically indicated additional testing with an alternate test  methodology [phone]) is advised. The SARS-CoV-2 RNA is generally  detectable in upper and lower respiratory sp ecimens during the acute  phase of infection. The expected result is Negative. Fact Sheet for Patients:  StrictlyIdeas.no Fact Sheet for Healthcare Providers: BankingDealers.co.za This test is not yet approved or cleared by the [place] FDA and has been authorized for detection and/or diagnosis of SARS-CoV-2 by FDA under an Emergency Use Authorization (EUA).  This EUA will remain in effect (meaning this test can be used) for the duration of the COVID-19 declaration under Section 564(b)(1) of the Act, 21 U.S.C. section 360bbb-3(b)(1), unless the authorization is terminated or revoked sooner. Performed at [facility], [address] [place], [place] [address]   Glucose, capillary     Status: Abnormal   Collection Time: 11/20/18  4:43 PM  Result Value Ref Range   Glucose-Capillary 141 (H) 70 - 99 mg/dL  TSH     Status: None   Collection Time: 11/20/18  6:38 PM  Result Value Ref Range   TSH 1.583 0.350 - 4.500 uIU/mL    Comment: Performed by a 3rd Generation assay with a functional sensitivity of <=0.01 uIU/mL. Performed at [facility], [address]  [place], [place] [address]   Lipid panel     Status: None   Collection Time: 11/20/18  6:38 PM  Result Value Ref Range   Cholesterol 181 0 - 200 mg/dL   Triglycerides 59 <150 mg/dL   HDL 97 >40 mg/dL   Total CHOL/HDL Ratio 1.9 RATIO   VLDL 12 0 - 40 mg/dL   LDL Cholesterol 72 0 - 99 mg/dL    Comment:        Total Cholesterol/HDL:CHD Risk Coronary Heart Disease Risk Table                     Men   Women  1/2 Average Risk   3.4   3.3  Average Risk       5.0   4.4  2 X Average Risk   9.6   7.1  3 X Average Risk  23.4   11.0        Use the calculated Patient Ratio above and the CHD Risk Table to determine the patient's CHD Risk.        ATP III CLASSIFICATION (LDL):  <100     mg/dL   Optimal  100-129  mg/dL   Near or Above                    Optimal  130-159  mg/dL   Borderline  160-189  mg/dL   High  >  190     mg/dL   Very High Performed at [facility], [address] [place], [place] [address]   Glucose, capillary     Status: Abnormal   Collection Time: 11/20/18  8:47 PM  Result Value Ref Range   Glucose-Capillary 122 (H) 70 - 99 mg/dL  Glucose, capillary     Status: Abnormal   Collection Time: 11/21/18  8:20 AM  Result Value Ref Range   Glucose-Capillary 120 (H) 70 - 99 mg/dL   Comment 1 Notify RN    *Note: Due to a large number of results and/or encounters for the requested time period, some results have not been displayed. A complete set of results can be found in Results Review.    Current Facility-Administered Medications  Medication Dose Route Frequency Provider Last Rate Last Dose  . albuterol (PROVENTIL) (2.5 MG/3ML) 0.083% nebulizer solution 3 mL  3 mL Inhalation Q6H PRN Lavella Hammock, MD      . buPROPion (WELLBUTRIN XL) 24 hr tablet 150 mg  150 mg Oral Daily Lavella Hammock, MD   150 mg at 11/21/18 0957  . clonazePAM (KLONOPIN) disintegrating tablet 0.25 mg  0.25 mg Oral BID Merlyn Lot, MD   0.25 mg at 11/20/18 2121  . dicyclomine (BENTYL)  capsule 10 mg  10 mg Oral TID Lavella Hammock, MD   10 mg at 11/21/18 0957  . donepezil (ARICEPT) tablet 10 mg  10 mg Oral QHS Lavella Hammock, MD   10 mg at 11/20/18 2122  . ferrous sulfate tablet 325 mg  325 mg Oral BID Lavella Hammock, MD   325 mg at 11/21/18 0957  . furosemide (LASIX) tablet 80 mg  80 mg Oral Daily Lavella Hammock, MD   80 mg at 11/21/18 0957  . gabapentin (NEURONTIN) capsule 400 mg  400 mg Oral QHS Lavella Hammock, MD   400 mg at 11/20/18 2122  . hydrochlorothiazide (MICROZIDE) capsule 12.5 mg  12.5 mg Oral Q supper Lavella Hammock, MD   12.5 mg at 11/20/18 1647  . HYDROcodone-acetaminophen (NORCO) 7.5-325 MG per tablet 1 tablet  1 tablet Oral Q6H PRN Nena Polio, MD      . ibuprofen (ADVIL) tablet 600 mg  600 mg Oral Q6H PRN Lavella Hammock, MD   600 mg at 11/21/18 0513  . insulin aspart (novoLOG) injection 8 Units  8 Units Subcutaneous TID WC Lavella Hammock, MD   8 Units at 11/21/18 [phone]  . insulin glargine (LANTUS) injection 42 Units  42 Units Subcutaneous QPM Lavella Hammock, MD   42 Units at 11/20/18 2127  . lamoTRIgine (LAMICTAL) tablet 150 mg  150 mg Oral BID Lavella Hammock, MD   150 mg at 11/21/18 0957  . lidocaine (LIDODERM) 5 % 2 patch  2 patch Transdermal Q24H Lavella Hammock, MD   2 patch at 11/21/18 [phone]  . memantine (NAMENDA) tablet 5 mg  5 mg Oral Daily Lavella Hammock, MD   5 mg at 11/21/18 0958  . metoCLOPramide (REGLAN) tablet 5 mg  5 mg Oral TID AC & HS Lavella Hammock, MD   5 mg at 11/21/18 0834  . mirabegron ER (MYRBETRIQ) tablet 25 mg  25 mg Oral BID Lavella Hammock, MD   25 mg at 11/21/18 0958  . mometasone-formoterol (DULERA) 200-5 MCG/ACT inhaler 2 puff  2 puff Inhalation BID Lavella Hammock, MD   2 puff at 11/21/18 [phone]  .  ondansetron (ZOFRAN) tablet 4 mg  4 mg Oral Q8H PRN Lavella Hammock, MD      . oxybutynin (DITROPAN-XL) 24 hr tablet 5 mg  5 mg Oral Daily Lavella Hammock, MD   5 mg at 11/21/18 0958  . pantoprazole (PROTONIX)  EC tablet 40 mg  40 mg Oral Daily Lavella Hammock, MD   40 mg at 11/21/18 0958  . pregabalin (LYRICA) capsule 75 mg  75 mg Oral TID Lavella Hammock, MD   75 mg at 11/21/18 0958  . rOPINIRole (REQUIP) tablet 3 mg  3 mg Oral QHS Lavella Hammock, MD   3 mg at 11/20/18 2121  . senna (SENOKOT) tablet 8.6 mg  1 tablet Oral Daily Lavella Hammock, MD   Stopped at 11/20/18 1416  . simvastatin (ZOCOR) tablet 10 mg  10 mg Oral QHS Lavella Hammock, MD   10 mg at 11/20/18 2123  . spironolactone (ALDACTONE) tablet 50 mg  50 mg Oral BID Lavella Hammock, MD   50 mg at 11/21/18 0958  . trolamine salicylate (ASPERCREME) 10 % cream 1 application  1 application Topical BID PRN Lavella Hammock, MD       Current Outpatient Medications  Medication Sig Dispense Refill  . albuterol (PROVENTIL HFA;VENTOLIN HFA) 108 (90 Base) MCG/ACT inhaler Inhale 2 puffs into the lungs every 6 (six) hours as needed for wheezing or shortness of breath.    . budesonide-formoterol (SYMBICORT) 160-4.5 MCG/ACT inhaler Inhale 2 puffs into the lungs 2 (two) times daily.     Marland Kitchen buPROPion (WELLBUTRIN XL) 150 MG 24 hr tablet Take 1 tablet (150 mg total) by mouth daily. 90 tablet 1  . diclofenac sodium (VOLTAREN) 1 % GEL Apply topically 3 (three) times daily.    Marland Kitchen dicyclomine (BENTYL) 10 MG capsule Take 10 mg by mouth 3 (three) times daily.     Marland Kitchen donepezil (ARICEPT) 10 MG tablet Take by mouth.    . ferrous sulfate 325 (65 FE) MG tablet Take 325 mg by mouth 2 (two) times daily.     . furosemide (LASIX) 80 MG tablet Take 80 mg by mouth.    . gabapentin (NEURONTIN) 300 MG capsule 300-600 mg. Takes 300 mg at 8 am , 2pm, 600 mg 8 pm.    . hydrochlorothiazide (MICROZIDE) 12.5 MG capsule Take 12.5 mg by mouth daily with supper.    Marland Kitchen HYDROcodone-acetaminophen (NORCO) 7.5-325 MG tablet Take 1 tablet by mouth every 6 (six) hours as needed for moderate pain.    Marland Kitchen insulin glargine (LANTUS) 100 UNIT/ML injection Inject 42 Units into the skin every  evening.     . insulin lispro (HUMALOG) 100 UNIT/ML injection Inject 8 Units into the skin 3 (three) times daily before meals.     . lactulose (CHRONULAC) 10 GM/15ML solution Take 10 g by mouth daily. (0800)    . lamoTRIgine (LAMICTAL) 150 MG tablet Take 1 tablet (150 mg total) by mouth 2 (two) times daily. 180 tablet 0  . lidocaine (LIDODERM) 5 % Place 2 patches onto the skin daily. Apply 1 patch to 2 areas on back (2 patches) every morning and remove at bedtime.    . liraglutide (VICTOZA) 18 MG/3ML SOPN Inject 1.8 mg into the skin daily.    . memantine (NAMENDA) 5 MG tablet Take 5 mg by mouth daily.     . metoCLOPramide (REGLAN) 5 MG tablet Take 5 mg by mouth 4 (four) times daily -  before meals and  at bedtime. (0800, 1200, 1600 & 2000)    . MYRBETRIQ 25 MG TB24 tablet Take 25 mg by mouth 2 (two) times a day.     . nystatin (MYCOSTATIN/NYSTOP) powder Apply topically 4 (four) times daily. (Patient taking differently: Apply topically 4 (four) times daily. Apply to inner thigh/ peri area twice daily and apply to abdomen 4 times daily.) 15 g 0  . omeprazole (PRILOSEC) 20 MG capsule Take 20 mg by mouth daily.     . ondansetron (ZOFRAN) 4 MG tablet Take 4 mg by mouth every 8 (eight) hours as needed for nausea.     Marland Kitchen oxybutynin (DITROPAN-XL) 5 MG 24 hr tablet Take 5 mg by mouth daily. (0800)    . pregabalin (LYRICA) 75 MG capsule Take 75 mg by mouth 3 (three) times daily.     Marland Kitchen rOPINIRole (REQUIP) 3 MG tablet Take 3 mg by mouth at bedtime. (2000)    . senna (GERI-KOT) 8.6 MG tablet Take 1 tablet by mouth daily.    . simvastatin (ZOCOR) 10 MG tablet Take 10 mg by mouth at bedtime.    Marland Kitchen spironolactone (ALDACTONE) 50 MG tablet Take 50 mg by mouth 2 (two) times daily. (0800)    . trolamine salicylate (ASPERCREME) 10 % cream Apply 1 application topically 2 (two) times daily as needed for muscle pain.    Marland Kitchen albuterol (PROVENTIL) (2.5 MG/3ML) 0.083% nebulizer solution Take 2.5 mg by nebulization every 6 (six)  hours as needed for wheezing or shortness of breath.    . bacitracin 500 UNIT/GM ointment Apply 1 application topically 2 (two) times daily. 15 g 0  . donepezil (ARICEPT) 5 MG tablet     . Glucagon 3 MG/DOSE POWD as directed    . ibuprofen (ADVIL) 600 MG tablet 1 tablet with food or milk as needed    . naloxone (NARCAN) nasal spray 4 mg/0.1 mL Place 1 spray into the nose.      Musculoskeletal: Strength & Muscle Tone: within normal limits Gait & Station: normal Patient leans: N/A  Psychiatric Specialty Exam: Physical Exam  Nursing note and vitals reviewed. Constitutional: She appears well-developed and well-nourished. She appears distressed.  HENT:  Head: Normocephalic and atraumatic.  Eyes: EOM are normal.  Neck: Normal range of motion.  Cardiovascular: Normal rate and regular rhythm.  Respiratory: Effort normal. No respiratory distress.  Musculoskeletal: Normal range of motion.  Neurological: She is alert.  Psychiatric: Her mood appears anxious. Her speech is rapid and/or pressured. She is agitated and hyperactive. Thought content is paranoid and delusional. Cognition and memory are impaired. She expresses impulsivity. She expresses no homicidal and no suicidal ideation. She is inattentive.    Review of Systems  Constitutional: Negative.   HENT: Negative.   Eyes: Negative.   Respiratory: Negative.   Cardiovascular: Negative.   Gastrointestinal: Positive for abdominal pain.  Musculoskeletal: Positive for back pain, joint pain and myalgias.  Skin: Negative.   Neurological: Negative.   Psychiatric/Behavioral: Positive for depression and memory loss. Negative for hallucinations, substance abuse and suicidal ideas. The patient is nervous/anxious and has insomnia.     Blood pressure (!) 114/55, pulse 73, temperature 97.8 F (36.6 C), temperature source Oral, resp. rate 18, height 5' 3"  (1.6 m), weight 97.5 kg, SpO2 98 %.Body mass index is 38.09 kg/m.  General Appearance: Casual   Eye Contact:  Fair  Speech:  Pressured  Volume:  Increased  Mood:  Anxious and Dysphoric  Affect:  Constricted and Labile  Thought Process:  Goal Directed  Orientation:  Full (Time, Place, and Person)  Thought Content:  Illogical, Hallucinations: None and Rumination  Suicidal Thoughts:  No  Homicidal Thoughts:  No  Memory:  Immediate;   Fair Recent;   Fair Remote;   Fair  Judgement:  Impaired  Insight:  Shallow  Psychomotor Activity:  Restlessness  Concentration:  Concentration: Fair  Recall:  [employer] of Knowledge:  Fair  Language:  Fair  Akathisia:  No  Handed:  Right  AIMS (if indicated):     Assets:  Agricultural consultant Housing Social Support  ADL's:  Impaired  Cognition:  Impaired,  Mild  Sleep:   Decreased     Treatment Plan Summary: Patient is under involuntary commitment for bipolar illness. Stephanie [name] is a 68 y.o. female with a history of bipolar disorder and generalized anxiety who has been undergoing medication changes and now presents with mania.  I have re-added clonazepam to her medication regimen, however ultimately her PCP and pain medicine providers would like to see her off of this medication.  Patient historically has required antipsychotics for mood control.  Her mood has been labile in the emergency department.  She would benefit from inpatient psychiatric admission for medication management and stabilization.  Daily contact with patient to assess and evaluate symptoms and progress in treatment and Medication management  Restarted home medications  Disposition: Recommend psychiatric Inpatient admission when medically cleared. Supportive therapy provided about ongoing stressors.  Recommend admission to the geriatric psychiatry unit. Chest x-ray, EKG, UA, UDS, TSH, CBC, CMP, and COVID screening have been completed. COVID negative Urine positive for opiates Blood alcohol level, salicylate level, acetaminophen  level negative Lipids and Hemoglobin A1c pending. Patient has been accepted to inpatient geriatric psychiatry, and will be transported to [place], MD 11/21/2018 10:12 AM

## 2018-11-21 NOTE — ED Provider Notes (Signed)
-----------------------------------------   4:58 AM on 11/21/2018 -----------------------------------------   Blood pressure (!) 114/55, pulse 73, temperature 97.8 F (36.6 C), temperature source Oral, resp. rate 18, height 5' 3"  (1.6 m), weight 97.5 kg, SpO2 98 %.  The patient is calm and cooperative at this time.  There have been no acute events since the last update.  Patient has been seen by psychiatry who has recommended inpatient admission.    Harvest Dark, MD 11/21/18 [phone]

## 2018-11-21 NOTE — ED Notes (Signed)
Hourly rounding reveals patient in room. Stable, in no acute distress. Q15 minute rounds and monitoring via Engineer, drilling to continue.

## 2018-11-21 NOTE — ED Notes (Signed)
Hourly rounding reveals patient sleeping in room. No complaints, stable, in no acute distress. Q15 minute rounds and monitoring via Security Cameras to continue. 

## 2018-11-21 NOTE — BH Assessment (Signed)
Referral information for Psychiatric Hospitalization faxed to;   Marland Kitchen Cristal Ford [phone]),   . [facility] ([phone]--[phone]  . [phone]- [phone] . [place] [phone]),  . FoMikel [phone]   . [place] ([phone] . [phone]   . [phone]   . [facility] ([phone]  . [place] [phone]   . [place] [phone]  . [phone]

## 2018-12-05 ENCOUNTER — Ambulatory Visit: Payer: Medicare Other | Admitting: Licensed Clinical Social Worker

## 2018-12-05 ENCOUNTER — Other Ambulatory Visit: Payer: Self-pay

## 2018-12-16 IMAGING — CR DG CHEST 2V
1 series · 2 of 2 positions shown · non-contrast
Comparison: Radiograph November 05, 2017.

CLINICAL DATA: Aortic dissection.

EXAM:
CHEST - 2 VIEW

[Series 1: dg chest 2 view · 0.14mm/px · 2 of 2 slices shown]
[im 1/2]
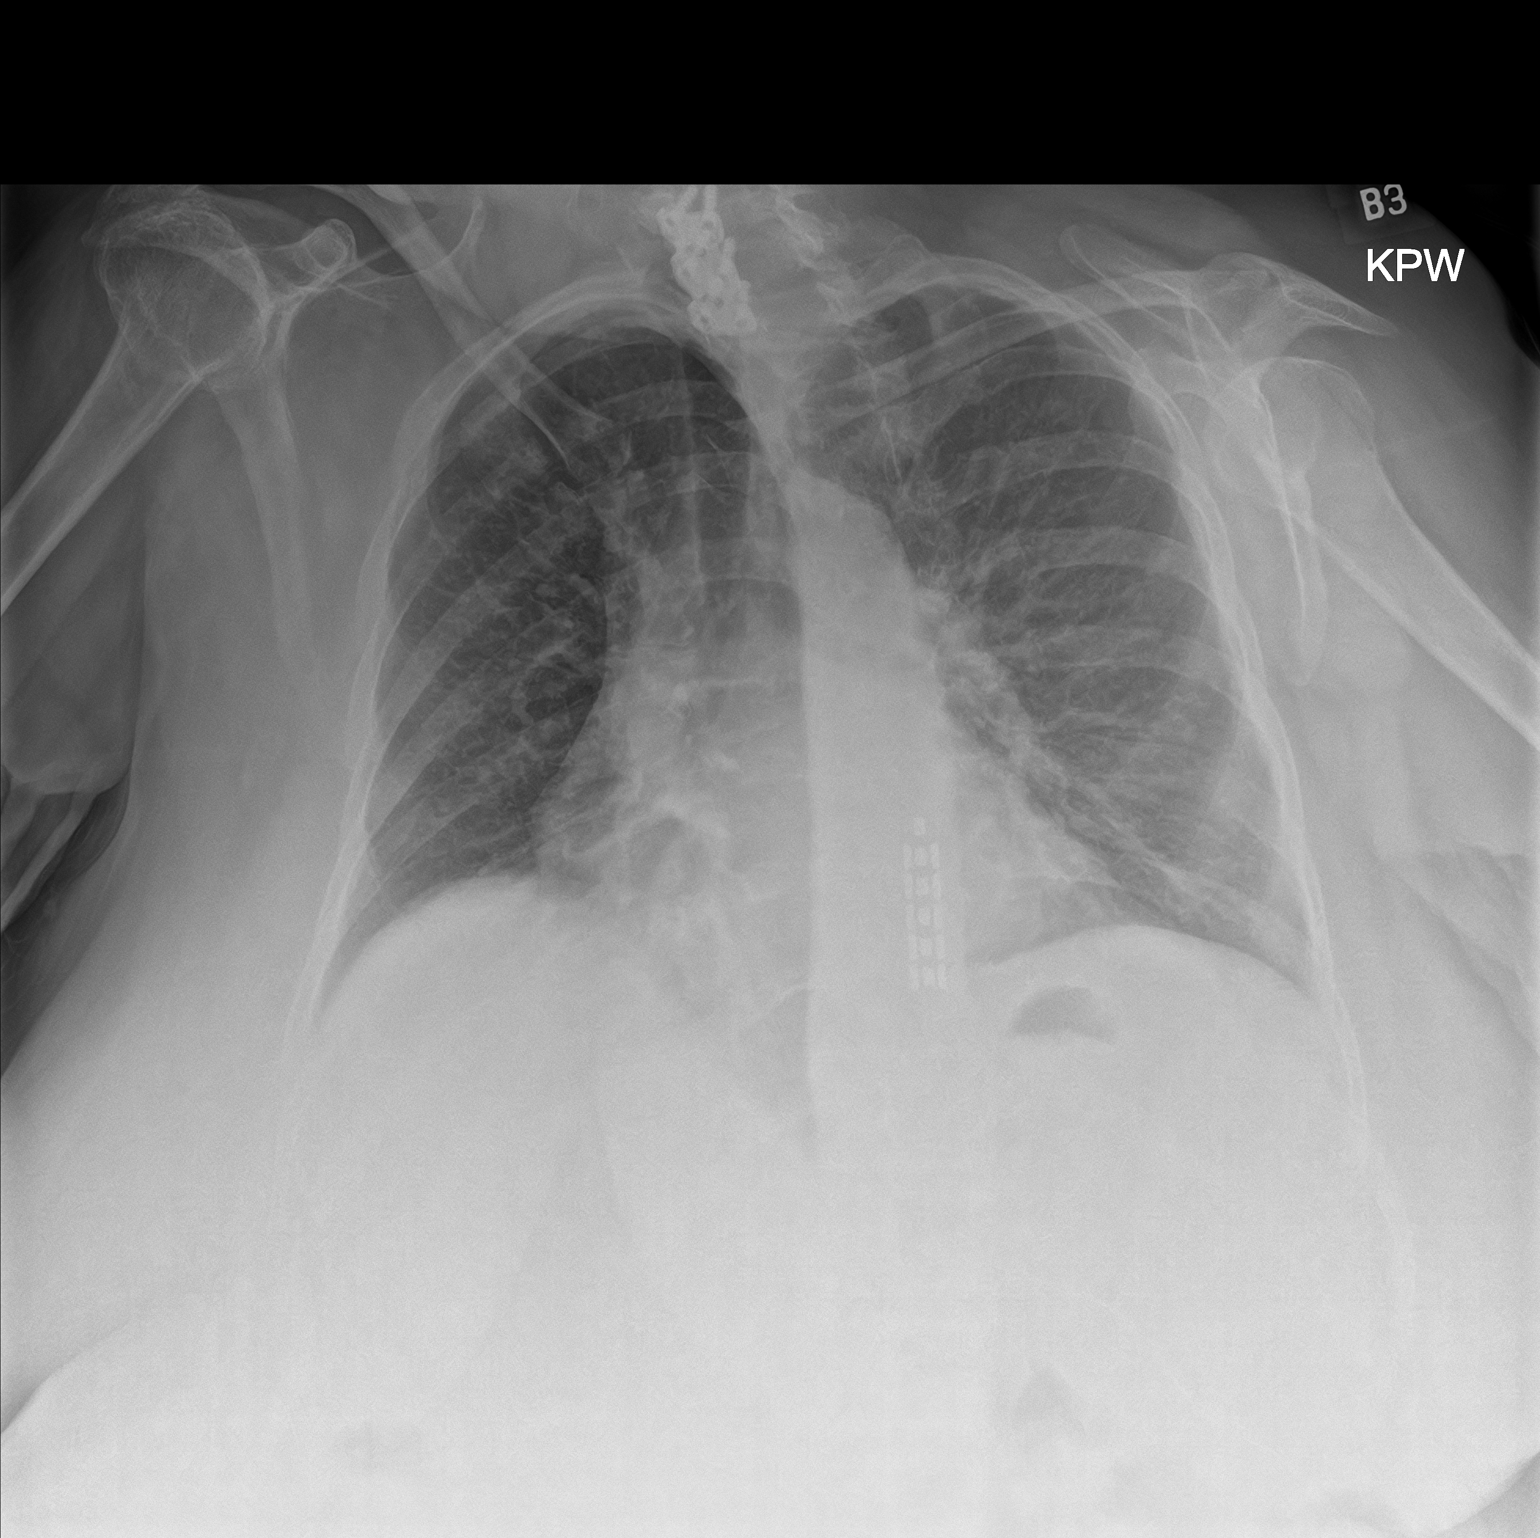
[im 2/2]
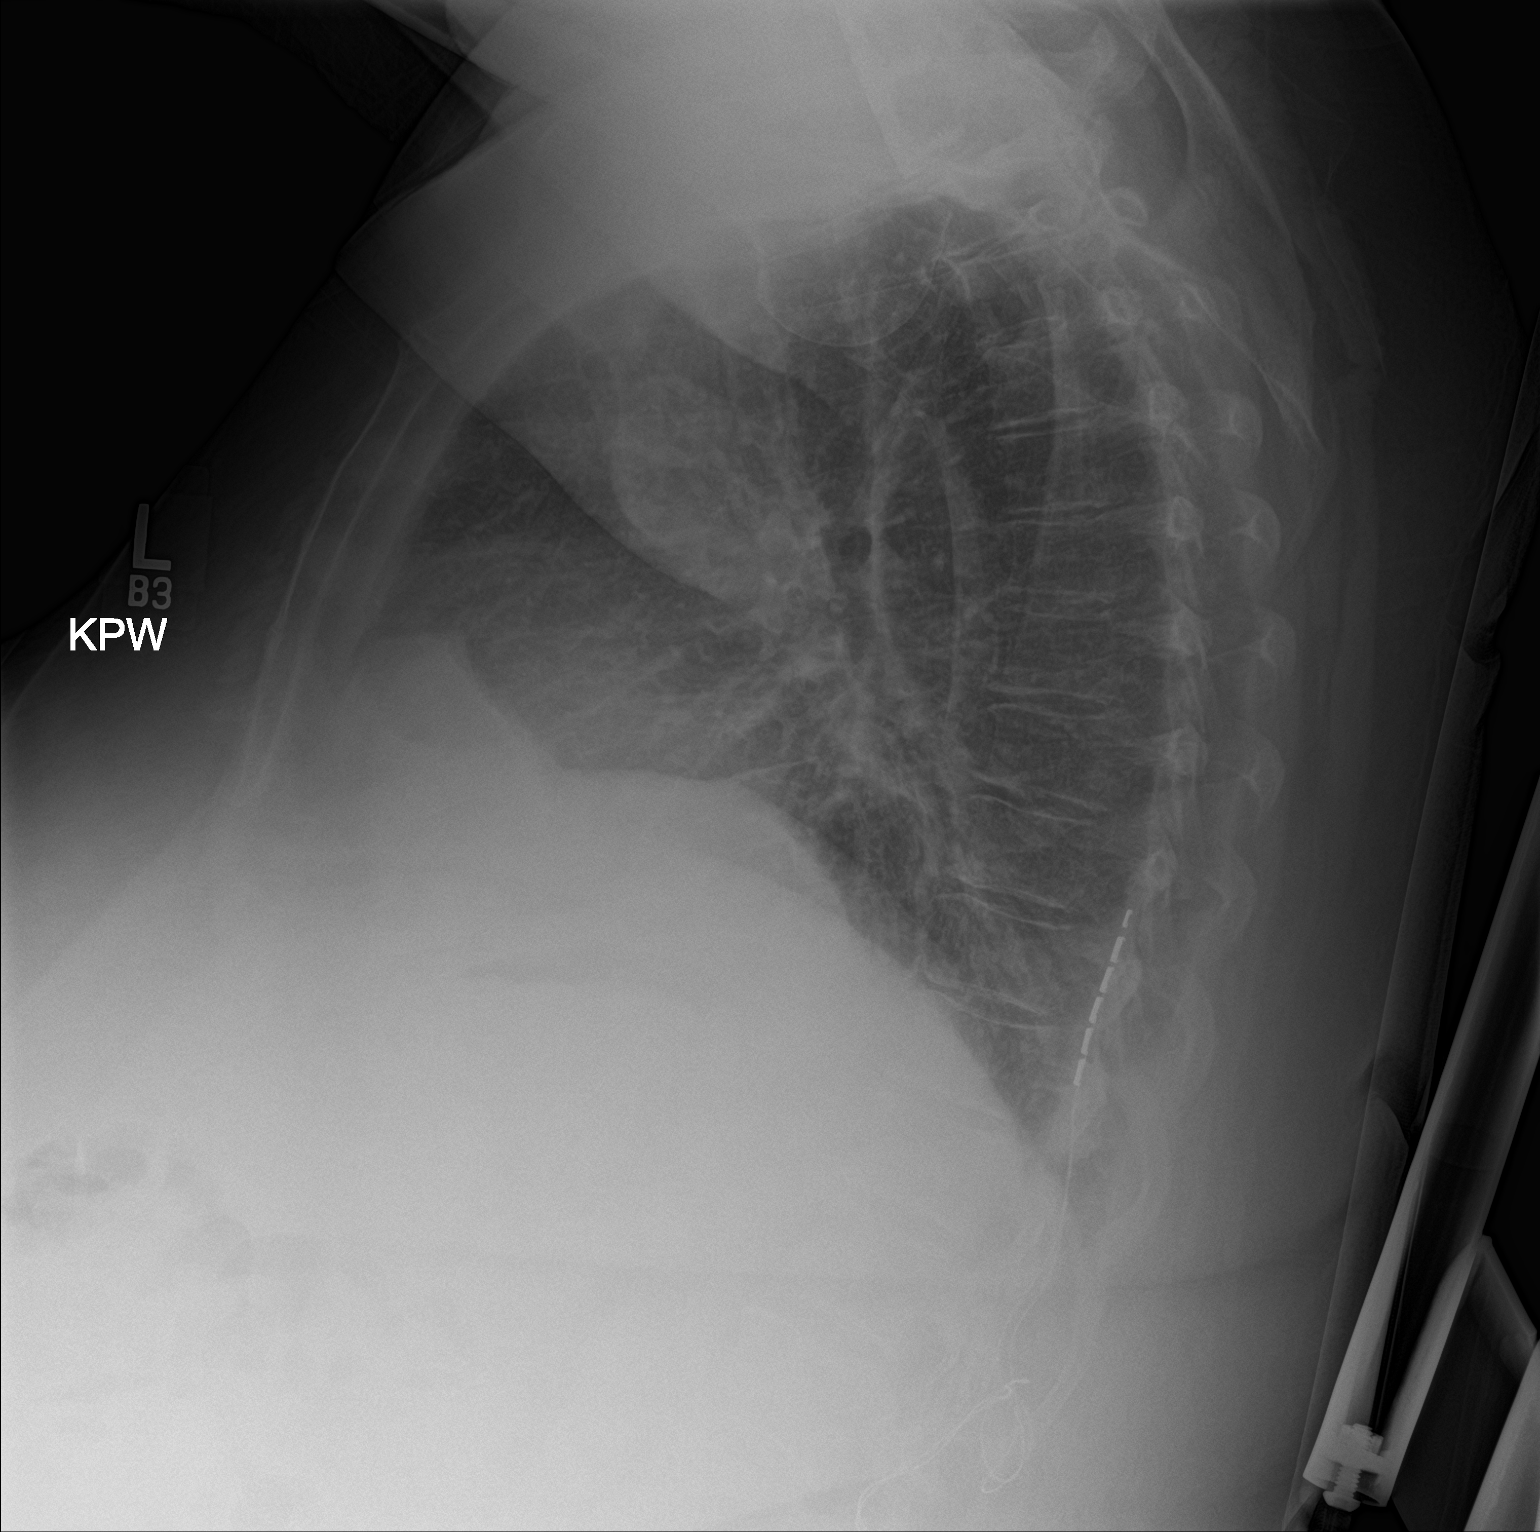

[2 of 2 positions shown; findings below may reference images not displayed]

FINDINGS: Stable cardiomediastinal silhouette. No pneumothorax or pleural
effusion is noted. No acute pulmonary disease is noted. Stimulator
leads are seen in lower thoracic spinal canal.
IMPRESSION: No active cardiopulmonary disease.

## 2019-01-01 ENCOUNTER — Ambulatory Visit (INDEPENDENT_AMBULATORY_CARE_PROVIDER_SITE_OTHER): Payer: Medicare Other | Admitting: Psychiatry

## 2019-01-01 ENCOUNTER — Encounter: Payer: Self-pay | Admitting: Psychiatry

## 2019-01-01 ENCOUNTER — Other Ambulatory Visit: Payer: Self-pay

## 2019-01-01 DIAGNOSIS — L243 Irritant contact dermatitis due to cosmetics: Secondary | ICD-10-CM | POA: Insufficient documentation

## 2019-01-01 DIAGNOSIS — F3130 Bipolar disorder, current episode depressed, mild or moderate severity, unspecified: Secondary | ICD-10-CM

## 2019-01-01 DIAGNOSIS — Z794 Long term (current) use of insulin: Secondary | ICD-10-CM | POA: Insufficient documentation

## 2019-01-01 DIAGNOSIS — F411 Generalized anxiety disorder: Secondary | ICD-10-CM | POA: Diagnosis not present

## 2019-01-01 DIAGNOSIS — E11649 Type 2 diabetes mellitus with hypoglycemia without coma: Secondary | ICD-10-CM | POA: Insufficient documentation

## 2019-01-01 DIAGNOSIS — F5105 Insomnia due to other mental disorder: Secondary | ICD-10-CM | POA: Diagnosis not present

## 2019-01-01 DIAGNOSIS — K2100 Gastro-esophageal reflux disease with esophagitis, without bleeding: Secondary | ICD-10-CM | POA: Insufficient documentation

## 2019-01-01 DIAGNOSIS — I739 Peripheral vascular disease, unspecified: Secondary | ICD-10-CM | POA: Insufficient documentation

## 2019-01-01 DIAGNOSIS — R05 Cough: Secondary | ICD-10-CM | POA: Insufficient documentation

## 2019-01-01 DIAGNOSIS — R059 Cough, unspecified: Secondary | ICD-10-CM | POA: Insufficient documentation

## 2019-01-01 MED ORDER — PAROXETINE HCL 10 MG PO TABS: 10.0000 mg | ORAL_TABLET | Freq: Every day | ORAL | 1 refills | Status: DC

## 2019-01-01 MED ORDER — BUPROPION HCL ER (SR) 100 MG PO TB12: 100.0000 mg | ORAL_TABLET | Freq: Every day | ORAL | 0 refills | Status: DC

## 2019-01-01 MED ORDER — GABAPENTIN 300 MG PO CAPS: 300.0000 mg | ORAL_CAPSULE | Freq: Three times a day (TID) | ORAL | 1 refills | Status: DC

## 2019-01-01 NOTE — Progress Notes (Signed)
Virtual Visit via Video Note  I connected with Stephanie [name] on 01/01/19 at 10:45 AM EDT by a video enabled telemedicine application and verified that I am speaking with the correct person using two identifiers.   I discussed the limitations of evaluation and management by telemedicine and the availability of in person appointments. The patient expressed understanding and agreed to proceed.   I discussed the assessment and treatment plan with the patient. The patient was provided an opportunity to ask questions and all were answered. The patient agreed with the plan and demonstrated an understanding of the instructions.   The patient was advised to call back or seek an in-person evaluation if the symptoms worsen or if the condition fails to improve as anticipated.   Alexander MD OP Progress Note  01/01/2019 5:29 PM Stephanie [name]  MRN:  [id number]  Chief Complaint:  Chief Complaint    Follow-up     HPI: Stephanie [name] is a 68 year old Caucasian female, divorced, lives at [facility] living facility, on SSD, has a history of bipolar disorder, GAD, restless leg syndrome, chronic pain, fibromyalgia, insomnia, COPD, MCI, history of lung nodules, sleep apnea, ascites, history of hepatitis B, history of stroke, vitamin D deficiency, aortic dissection, thrombocytopenia, chronic anemia, hypertension, hyperlipidemia, was evaluated by telemedicine today.  Staff at assisted living [name] assisted during appointment today by providing collateral information.  Patient reports she is currently doing well.  She reports she does feel mildly anxious however is able to cope well.  She denies any significant mood swings or crying spells.  She reports sleep is good.  She reports she does have some shakiness of one of her lower extremities.  She however has a history of chronic tremors as well as arthritis, fibromyalgia and chronic pain.  Patient reports she does not have any memory of recent  inpatient mental health admission.  However per staff she was admitted to our facility and they will send the discharge instructions to writer today by fax.  Patient was seen by our consult team at [place]. Stephanie [name] and at that time it was recommended that she be admitted to an inpatient facility.  I have reviewed medical records in [facility] per Stephanie. Leverne [name] dated 11/21/2018.  Per staff patient currently is doing well.  They have not noticed any significant tremors of her lower extremities however they will monitor it closely.  The only medication change that was made as per medication list was reducing her Wellbutrin dosage to an SR 100 mg daily.  She was also restarted on Klonopin 0.5 mg twice a day.  Patient appeared to be alert oriented to person place time and situation.  Patient denied any other concerns today.   Visit Diagnosis:    ICD-10-CM   1. Bipolar I disorder, most recent episode depressed (HCC)  F31.30 buPROPion (WELLBUTRIN SR) 100 MG 12 hr tablet    gabapentin (NEURONTIN) 300 MG capsule    PARoxetine (PAXIL) 10 MG tablet  2. GAD (generalized anxiety disorder)  F41.1 PARoxetine (PAXIL) 10 MG tablet  3. Insomnia due to mental disorder  F51.05     Past Psychiatric History: I have reviewed past psychiatric history from my progress note on 12/23/2017.  Past trials of Seroquel, trazodone, Ambien.  Past Medical History:  Past Medical History:  Diagnosis Date  . Adenomatous colon polyp 08/27/2014   Polyps x 3  . Anemia   . Anxiety   . Aortic dissection ([place])  Type 1  . Arthritis   . Asthma   . Bipolar affective disorder ([place])    h/o  . Blood transfusion 2000  . Blood transfusion without reported diagnosis   . Cancer ([place])   . Chronic abdominal pain    abdominal wall pain  . 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 Colon polyp   . COPD (chronic obstructive pulmonary disease) ([place])   . Depression   . Diabetes mellitus without complication ([place])   .  Fatty liver 04/09/08   found in abd CT  . GERD (gastroesophageal reflux disease)   . H/O: CVA (cardiovascular accident)    Stephanie [name]  . H/O: rheumatic fever   . Hepatitis    HEP "B" twenty years ago  . Hyperlipidemia   . Hypertension   . IBS (irritable bowel syndrome)   . Incontinence   . Insomnia   . Lower extremity edema   . Neuropathy    lower legs D/T DM  . Obstructive sleep apnea    Use C-PAP twice per week  . Platelets decreased ([place])   . RLS (restless legs syndrome)   . Shortness of breath   . Sleep apnea   . Stroke [facility])    Stephanie [name]- 2002  . Ulcer   . Unspecified disorders of nervous system   . Wears dentures    full upper and lower    Past Surgical History:  Procedure Laterality Date  . ABDOMINAL EXPLORATION SURGERY    . ABDOMINAL HYSTERECTOMY     total  . Axillary artery cannulation via 8-mm Hemashield graft, median sternotomy, extracorporeal circulation with deep hypothermic circulatory arrest, repair of  aortic dessection  01/23/2009   Stephanie [name]  . BACK SURGERY      x 5  . cataract     both eyes  . CHOLECYSTECTOMY    . COLONOSCOPY    . EYE SURGERY Bilateral    Cataract Extraction with IOL  . FACIAL LACERATION REPAIR Left 04/11/2018   Procedure: EARLOBE REPAIR;  Surgeon: Stephanie Manner, MD;  Location: [facility];  Service: ENT;  Laterality: Left;  Diabetic - insulin and oral meds sleep apnea  . HEMORROIDECTOMY     and colon polyp removed  . KNEE ARTHROSCOPY WITH LATERAL RELEASE Right 11/15/2016   Procedure: KNEE ARTHROSCOPY WITH LATERAL RELEASE;  Surgeon: Stephanie Mull, MD;  Location: [facility];  Service: Orthopedics;  Laterality: Right;  . LIVER BIOPSY  04/10/2012   Procedure: LIVER BIOPSY;  Surgeon: Stephanie Castle, MD;  Location: [facility];  Service: Endoscopy;  Laterality: N/A;  ultrasound to mark order for abd limited/liver to be marked to be sent by [email or web address]   . LUMBAR FUSION  10/09  . LUMBAR WOUND DEBRIDEMENT  05/09/2011   Procedure: LUMBAR  WOUND DEBRIDEMENT;  Surgeon: Stephanie [name];  Location: [facility];  Service: Orthopedics;  Laterality: N/A;  IRRIGATION AND DEBRIDEMENT SPINAL WOUND  . POSTERIOR CERVICAL FUSION/FORAMINOTOMY    . ROTATOR CUFF REPAIR     left    Family Psychiatric History: I have reviewed family psychiatric history from my progress note on 12/23/2017. Family History:  Family History  Problem Relation Age of Onset  . Breast cancer Mother   . Lung cancer Mother   . Stomach cancer Father   . Diabetes Other        4 aunts, and 1 uncle  . Depression Brother   . Anxiety disorder Brother   . Colon cancer Neg Hx   .  Esophageal cancer Neg Hx   . Rectal cancer Neg Hx     Social History: I have reviewed social history from my progress note on 12/23/2017. Social History   Socioeconomic History  . Marital status: Divorced    Spouse name: Not on file  . Number of children: 2  . Years of education: Not on file  . Highest education level: 8th grade  Occupational History  . Occupation: Disabled    Employer: [employer]  Social Needs  . Financial resource strain: Not hard at all  . Food insecurity    Worry: Never true    Inability: Never true  . Transportation needs    Medical: No    Non-medical: No  Tobacco Use  . Smoking status: Former Smoker    Packs/day: 1.50    Years: 50.00    Pack years: 75.00    Types: Cigarettes    Quit date: 04/06/2013    Years since quitting: 5.7  . Smokeless tobacco: Never Used  Substance and Sexual Activity  . Alcohol use: No    Alcohol/week: 0.0 standard drinks  . Drug use: No  . Sexual activity: Never  Lifestyle  . Physical activity    Days per week: 0 days    Minutes per session: 0 min  . Stress: Very much  Relationships  . Social Herbalist on phone: Not on file    Gets together: Not on file    Attends religious service: Never    Active member of club or organization: No    Attends meetings of clubs or organizations: Never    Relationship status:  Divorced  Other Topics Concern  . Not on file  Social History Narrative   1 son, 1+ y/o   Daily caffeine use: 2 cups daily   Does not get regular exercise   Disability due to bipolar      Has a living will- she is a DNR (she has form at home per pt).    Allergies:  Allergies  Allergen Reactions  . Cortisone     Increased BS  . Doxycycline Hives, Swelling, Other (See Comments) and Nausea Only    Reaction:  Facial swelling  Face red and swollen; no difficulty breathing    Metabolic Disorder Labs: Lab Results  Component Value Date   HGBA1C 5.6 11/20/2018   MPG 114.02 11/20/2018   MPG 103 07/20/2016   No results found for: PROLACTIN Lab Results  Component Value Date   CHOL 181 11/20/2018   TRIG 59 11/20/2018   HDL 97 11/20/2018   CHOLHDL 1.9 11/20/2018   VLDL 12 11/20/2018   LDLCALC 72 11/20/2018   LDLCALC 75 04/05/2016   Lab Results  Component Value Date   TSH 1.583 11/20/2018   TSH 4.486 07/19/2017    Therapeutic Level Labs: No results found for: LITHIUM No results found for: VALPROATE No components found for:  CBMZ  Current Medications: Current Outpatient Medications  Medication Sig Dispense Refill  . albuterol (PROVENTIL HFA;VENTOLIN HFA) 108 (90 Base) MCG/ACT inhaler Inhale 2 puffs into the lungs every 6 (six) hours as needed for wheezing or shortness of breath.    Marland Kitchen albuterol (PROVENTIL) (2.5 MG/3ML) 0.083% nebulizer solution Take 2.5 mg by nebulization every 6 (six) hours as needed for wheezing or shortness of breath.    Marland Kitchen atorvastatin (LIPITOR) 10 MG tablet     . bacitracin 500 UNIT/GM ointment Apply 1 application topically 2 (two) times daily. 15 g 0  .  budesonide-formoterol (SYMBICORT) 160-4.5 MCG/ACT inhaler Inhale 2 puffs into the lungs 2 (two) times daily.     Marland Kitchen buPROPion (WELLBUTRIN SR) 100 MG 12 hr tablet Take 1 tablet (100 mg total) by mouth daily after breakfast. 90 tablet 0  . clonazePAM (KLONOPIN) 0.25 MG disintegrating tablet 1 tablet    .  diclofenac sodium (VOLTAREN) 1 % GEL Apply topically 3 (three) times daily.    . diclofenac sodium (VOLTAREN) 1 % GEL     . dicyclomine (BENTYL) 10 MG capsule Take 10 mg by mouth 3 (three) times daily.     Marland Kitchen donepezil (ARICEPT) 10 MG tablet Take by mouth.    . donepezil (ARICEPT) 10 MG tablet     . donepezil (ARICEPT) 5 MG tablet     . ferrous sulfate 325 (65 FE) MG tablet Take 325 mg by mouth 2 (two) times daily.     . furosemide (LASIX) 80 MG tablet Take 80 mg by mouth.    . gabapentin (NEURONTIN) 300 MG capsule Take 1 capsule (300 mg total) by mouth 3 (three) times daily. Takes 300 mg at 8 am , 2pm, 300 mg 8 pm. 270 capsule 1  . Glucagon 3 MG/DOSE POWD as directed    . HUMALOG KWIKPEN 100 UNIT/ML KwikPen     . hydrochlorothiazide (MICROZIDE) 12.5 MG capsule Take 12.5 mg by mouth daily with supper.    Marland Kitchen HYDROcodone-acetaminophen (NORCO) 7.5-325 MG tablet Take 1 tablet by mouth every 6 (six) hours as needed for moderate pain.    Marland Kitchen ibuprofen (ADVIL) 600 MG tablet 1 tablet with food or milk as needed    . insulin glargine (LANTUS) 100 UNIT/ML injection Inject 42 Units into the skin every evening.     . insulin lispro (HUMALOG) 100 UNIT/ML injection Inject 8 Units into the skin 3 (three) times daily before meals.     . lactulose (CHRONULAC) 10 GM/15ML solution Take 10 g by mouth daily. (0800)    . lamoTRIgine (LAMICTAL) 150 MG tablet Take 1 tablet (150 mg total) by mouth 2 (two) times daily. 180 tablet 0  . LANTUS SOLOSTAR 100 UNIT/ML Solostar Pen     . lidocaine (LIDODERM) 5 % Place 2 patches onto the skin daily. Apply 1 patch to 2 areas on back (2 patches) every morning and remove at bedtime.    . lidocaine (LIDODERM) 5 %     . liraglutide (VICTOZA) 18 MG/3ML SOPN Inject 1.8 mg into the skin daily.    . memantine (NAMENDA) 5 MG tablet Take 5 mg by mouth daily.     . metoCLOPramide (REGLAN) 5 MG tablet Take 5 mg by mouth 4 (four) times daily -  before meals and at bedtime. (0800, 1200, 1600 &  2000)    . MYRBETRIQ 25 MG TB24 tablet Take 25 mg by mouth 2 (two) times a day.     . naloxone (NARCAN) nasal spray 4 mg/0.1 mL Place 1 spray into the nose.    . nystatin (MYCOSTATIN/NYSTOP) powder Apply topically 4 (four) times daily. (Patient taking differently: Apply topically 4 (four) times daily. Apply to inner thigh/ peri area twice daily and apply to abdomen 4 times daily.) 15 g 0  . omeprazole (PRILOSEC) 20 MG capsule Take 20 mg by mouth daily.     . ondansetron (ZOFRAN) 4 MG tablet Take 4 mg by mouth every 8 (eight) hours as needed for nausea.     Marland Kitchen oxybutynin (DITROPAN-XL) 5 MG 24 hr tablet Take 5 mg by  mouth daily. (0800)    . PARoxetine (PAXIL) 10 MG tablet Take 1 tablet (10 mg total) by mouth daily. 30 tablet 1  . potassium chloride SA (K-DUR) 20 MEQ tablet     . pregabalin (LYRICA) 75 MG capsule Take 75 mg by mouth 3 (three) times daily.     . RESTASIS 0.05 % ophthalmic emulsion     . rOPINIRole (REQUIP) 3 MG tablet Take 3 mg by mouth at bedtime. (2000)    . senna (GERI-KOT) 8.6 MG tablet Take 1 tablet by mouth daily.    . simvastatin (ZOCOR) 10 MG tablet Take 10 mg by mouth at bedtime.    Marland Kitchen spironolactone (ALDACTONE) 50 MG tablet Take 50 mg by mouth 2 (two) times daily. (0800)    . trolamine salicylate (ASPERCREME) 10 % cream Apply 1 application topically 2 (two) times daily as needed for muscle pain.     No current facility-administered medications for this visit.      Musculoskeletal: Strength & Muscle Tone: UTA Gait & Station: Walks with walker Patient leans: N/A  Psychiatric Specialty Exam: Review of Systems  Psychiatric/Behavioral: The patient is nervous/anxious.   All other systems reviewed and are negative.   There were no vitals taken for this visit.There is no height or weight on file to calculate BMI.  General Appearance: Casual  Eye Contact:  Fair  Speech:  Clear and Coherent  Volume:  Normal  Mood:  Anxious  Affect:  Appropriate  Thought Process:  Goal  Directed and Descriptions of Associations: Intact  Orientation:  Full (Time, Place, and Person)  Thought Content: Logical   Suicidal Thoughts:  No  Homicidal Thoughts:  No  Memory:  Immediate;   Fair Recent;   Fair Remote;   Fair  Judgement:  Fair  Insight:  Fair  Psychomotor Activity:  Normal  Concentration:  Concentration: Fair and Attention Span: Fair  Recall:  [employer] of Knowledge: Fair  Language: Fair  Akathisia:  No  Handed:  Right  AIMS (if indicated): has a history of chronic tremors   Assets:  Communication Skills Desire for Improvement Social Support  ADL's:  Intact  Cognition: WNL  Sleep:  Fair   Screenings: PHQ2-9     Clinical Support from 01/04/2018 in [facility] Clinical Support from 11/16/2017 in [facility] Office Visit from 10/18/2017 in [place] Procedure visit from 10/04/2017 in [place] Office Visit from 09/25/2017 in [place]  PHQ-2 Total Score  0  0  1  0  0       Assessment and Plan: Tranice is a 68 year old Caucasian female who has a history of bipolar disorder, GAD, insomnia, restless leg syndrome as well as multiple medical problems as summarized above was evaluated by telemedicine today.  Staff at ALF assisted by providing collateral information.  Patient with recent inpatient mental health admission currently reports mood symptoms as improved.  We will continue plan as noted below.  Plan Bipolar disorder-improving Lamotrigine 150 mg p.o. twice daily. Wellbutrin SR 100 mg p.o. daily-reduced dosage.  For GAD-improving Gabapentin 300 mg p.o. daily at 8 AM, 2 PM and 8 PM. Paxil 10 mg po daily Klonopin 0.25 mg p.o. twice daily. Patient to continue psychotherapy sessions with Ms. Peacock.  For insomnia-improving Will  monitor  For tremors-chronic She will continue to work with neurology.  For cognitive problems-chronic Memantine 5 mg p.o. daily She will continue to work with her neurologist.  Collateral information was obtained from [place] as summarized above.  Discussed with Stephanie [name] to fax her most recent medication list as well as discharge instruction to our fax number at [phone]  Follow-up in clinic in 1 month or sooner if needed.  September 8 at 10 AM  I have spent atleast 25 minutes non face to face with patient today. More than 50 % of the time was spent for psychoeducation and supportive psychotherapy and care coordination.  This note was generated in part or whole with voice recognition software. Voice recognition is usually quite accurate but there are transcription errors that can and very often do occur. I apologize for any typographical errors that were not detected and corrected.  Addendum: 5:28 pm Reviewed medication list and discharge instructions - she is currently also on paxil.        Ursula Alert, MD 01/01/2019, 5:29 PM

## 2019-01-02 ENCOUNTER — Ambulatory Visit: Payer: Medicare Other | Admitting: Pulmonary Disease

## 2019-01-09 ENCOUNTER — Other Ambulatory Visit: Payer: Self-pay

## 2019-01-09 ENCOUNTER — Ambulatory Visit
Admission: RE | Admit: 2019-01-09 | Discharge: 2019-01-09 | Disposition: A | Discharge status: Home / Self Care | Payer: Medicare Other | Source: Ambulatory Visit | Attending: Urgent Care | Admitting: Urgent Care

## 2019-01-09 ENCOUNTER — Ambulatory Visit: Payer: Medicaid Other | Admitting: Licensed Clinical Social Worker

## 2019-01-09 DIAGNOSIS — K7581 Nonalcoholic steatohepatitis (NASH): Secondary | ICD-10-CM | POA: Diagnosis present

## 2019-01-09 DIAGNOSIS — D696 Thrombocytopenia, unspecified: Secondary | ICD-10-CM | POA: Diagnosis not present

## 2019-01-09 DIAGNOSIS — K7460 Unspecified cirrhosis of liver: Secondary | ICD-10-CM | POA: Insufficient documentation

## 2019-01-11 ENCOUNTER — Other Ambulatory Visit: Payer: Self-pay

## 2019-01-11 DIAGNOSIS — K7460 Unspecified cirrhosis of liver: Secondary | ICD-10-CM

## 2019-01-11 DIAGNOSIS — K7581 Nonalcoholic steatohepatitis (NASH): Secondary | ICD-10-CM

## 2019-01-11 NOTE — Progress Notes (Signed)
[facility]  [address] [place], [place] [address] Phone: [phone]  Fax: [phone]   Clinic Day:  01/14/2019  Referring physician: Odessa Fleming, NP  Chief Complaint: Stephanie [name] is a 68 y.o. female with chronic thrombocytopenia and iron deficiency anemia who is seen for 6 month assessment.  HPI: The patient was last seen in the hematology clinic on 07/13/2018. At that time, she noted knee pain.  Weight was up 8 pounds.  Exam was stable. Hemoglobin 11.0 and platelets 91,000 on 07/11/2018.  Chest CT without contrast on 07/27/2018 revealed hazy patchy ground-glass opacity in both lungs, likely a partial airspace filling process or interstitial process.  There was no focal pneumonia. Pulmonary edema was certainly possible but no pleural effusions. No worrisome pulmonary lesions or nodules. Advanced cirrhotic changes involving the liver without focal lesion and associated splenomegaly.  She was seen in cardiothoracic surgery on 07/31/2018. She was stable. Annual follow-up after low-dose chest CT was scheduled.   Bilateral screening mammogram on 08/03/2018 revealed no evidence of malignancy.   She was seen in the [facility] ER on 08/09/2018 for diffuse, generalized pain. She was given Versed 0.5 mg IV and discharged. She again presented to the ER on 09/10/2018 for pleural chest pain and palpitation. CXR showed no acute cardiopulmonary disease.   CBC on 10/11/2018: hematocrit 36.6, hemoglobin 12.0, MCV 92.2, platelets 101,000, WBC 7,000.   She was seen in the [facility] ER on 11/20/2018 requesting a mental health evaluation for depression and mood swings after being taken off benzodiazapine. She was restarted on clonazepam.   Abdominal ultrasound on 01/09/2019 revealed a cirrhotic appearance of the liver. No focal masses were seen. Portal vein was patent. She has splenomegaly (15.1 cm; volume 689 cm3). Study was limited due to body habitus.   During the  interim, she "doesn't feel too good." She reports abdominal discomfort and bloating that comes and goes over the past 2 months. She reports intermittent nausea and loss of appetite. She denies any fevers. She has watery diarrhea alternating with black stools, which she attributes to lactulose. She has reflux.  Her exertional shortness of breath has improved. She reports hip pain and right knee pain. She reports bruising more than normal, specifically around her stomach. She denies any new medications other than hydrocodone.   She will see Dr. Quentin Ore tomorrow.    Past Medical History:  Diagnosis Date  . Adenomatous colon polyp 08/27/2014   Polyps x 3  . Anemia   . Anxiety   . 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 Arthritis   . Asthma   . Bipolar affective disorder ([place])    h/o  . Blood transfusion 2000  . Blood transfusion without reported diagnosis   . Cancer ([place])   . Chronic abdominal pain    abdominal wall pain  . 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 Colon polyp   . COPD (chronic obstructive pulmonary disease) ([place])   . Depression   . Diabetes mellitus without complication ([place])   . Fatty liver 04/09/08   found in abd CT  . GERD (gastroesophageal reflux disease)   . H/O: CVA (cardiovascular accident)    TIA  . H/O: rheumatic fever   . Hepatitis    HEP "B" twenty years ago  . Hyperlipidemia   . Hypertension   . IBS (irritable bowel syndrome)   . Incontinence   . Insomnia   . Lower extremity edema   .  Neuropathy    lower legs D/T DM  . Obstructive sleep apnea    Use C-PAP twice per week  . Platelets decreased ([place])   . RLS (restless legs syndrome)   . Shortness of breath   . Sleep apnea   . Stroke [facility])    TIA- 2002  . Ulcer   . Unspecified disorders of nervous system   . Wears dentures    full upper and lower    Past Surgical History:  Procedure Laterality Date  . ABDOMINAL EXPLORATION SURGERY    . ABDOMINAL HYSTERECTOMY     total  . Axillary artery  cannulation via 8-mm Hemashield graft, median sternotomy, extracorporeal circulation with deep hypothermic circulatory arrest, repair of  aortic dessection  01/23/2009   Dr Arlyce Dice  . BACK SURGERY      x 5  . cataract     both eyes  . CHOLECYSTECTOMY    . COLONOSCOPY    . EYE SURGERY Bilateral    Cataract Extraction with IOL  . FACIAL LACERATION REPAIR Left 04/11/2018   Procedure: EARLOBE REPAIR;  Surgeon: Carloyn Manner, MD;  Location: [facility];  Service: ENT;  Laterality: Left;  Diabetic - insulin and oral meds sleep apnea  . HEMORROIDECTOMY     and colon polyp removed  . KNEE ARTHROSCOPY WITH LATERAL RELEASE Right 11/15/2016   Procedure: KNEE ARTHROSCOPY WITH LATERAL RELEASE;  Surgeon: Corky Mull, MD;  Location: [facility];  Service: Orthopedics;  Laterality: Right;  . LIVER BIOPSY  04/10/2012   Procedure: LIVER BIOPSY;  Surgeon: Inda Castle, MD;  Location: [facility];  Service: Endoscopy;  Laterality: N/A;  ultrasound to mark order for abd limited/liver to be marked to be sent by [email or web address]   . LUMBAR FUSION  10/09  . LUMBAR WOUND DEBRIDEMENT  05/09/2011   Procedure: LUMBAR WOUND DEBRIDEMENT;  Surgeon: Dahlia Bailiff;  Location: [facility];  Service: Orthopedics;  Laterality: N/A;  IRRIGATION AND DEBRIDEMENT SPINAL WOUND  . POSTERIOR CERVICAL FUSION/FORAMINOTOMY    . ROTATOR CUFF REPAIR     left    Family History  Problem Relation Age of Onset  . Breast cancer Mother   . Lung cancer Mother   . Stomach cancer Father   . Diabetes Other        4 aunts, and 1 uncle  . Depression Brother   . Anxiety disorder Brother   . Colon cancer Neg Hx   . Esophageal cancer Neg Hx   . Rectal cancer Neg Hx     Social History:  reports that she quit smoking about 5 years ago. Her smoking use included cigarettes. She has a 75.00 pack-year smoking history. She has never used smokeless tobacco. She reports that she does not drink alcohol or use drugs. She is a resident at the [facility].  She is alone today.  Allergies:  Allergies  Allergen Reactions  . Cortisone     Increased BS  . Doxycycline Hives, Swelling, Other (See Comments) and Nausea Only    Reaction:  Facial swelling  Face red and swollen; no difficulty breathing    Current Medications: Current Outpatient Medications  Medication Sig Dispense Refill  . albuterol (PROVENTIL HFA;VENTOLIN HFA) 108 (90 Base) MCG/ACT inhaler Inhale 2 puffs into the lungs every 6 (six) hours as needed for wheezing or shortness of breath.    Marland Kitchen albuterol (PROVENTIL) (2.5 MG/3ML) 0.083% nebulizer solution Take 2.5 mg by nebulization every 6 (six) hours as needed for wheezing  or shortness of breath.    Marland Kitchen buPROPion (WELLBUTRIN SR) 100 MG 12 hr tablet Take 1 tablet (100 mg total) by mouth daily after breakfast. 90 tablet 0  . clonazePAM (KLONOPIN) 0.25 MG disintegrating tablet Take 0.25 mg by mouth 2 (two) times daily.     . diclofenac sodium (VOLTAREN) 1 % GEL Apply topically 3 (three) times daily.    Marland Kitchen dicyclomine (BENTYL) 10 MG capsule Take 10 mg by mouth 3 (three) times daily.     . diphenhydrAMINE (BENADRYL) 25 mg capsule Take 25 mg by mouth every 6 (six) hours as needed.    . donepezil (ARICEPT) 10 MG tablet Take 10 mg by mouth at bedtime.     . ferrous sulfate 325 (65 FE) MG tablet Take 325 mg by mouth 2 (two) times daily.     . furosemide (LASIX) 80 MG tablet Take 40 mg by mouth daily.     Marland Kitchen gabapentin (NEURONTIN) 300 MG capsule Take 1 capsule (300 mg total) by mouth 3 (three) times daily. Takes 300 mg at 8 am , 2pm, 300 mg 8 pm. 270 capsule 1  . HYDROcodone-acetaminophen (NORCO) 7.5-325 MG tablet Take 1 tablet by mouth every 6 (six) hours as needed for moderate pain.    Marland Kitchen insulin glargine (LANTUS) 100 UNIT/ML injection Inject 42 Units into the skin every evening.     . insulin lispro (HUMALOG) 100 UNIT/ML injection Inject 8 Units into the skin 3 (three) times daily before meals.     . lactulose (CHRONULAC) 10 GM/15ML  solution Take 10 g by mouth daily. (0800)    . lamoTRIgine (LAMICTAL) 150 MG tablet Take 1 tablet (150 mg total) by mouth 2 (two) times daily. 180 tablet 0  . lidocaine (LIDODERM) 5 % Place 1 patch onto the skin daily.     Marland Kitchen liraglutide (VICTOZA) 18 MG/3ML SOPN Inject 1.8 mg into the skin daily.    Marland Kitchen loperamide (IMODIUM) 2 MG capsule Take 4 mg by mouth as needed for diarrhea or loose stools.    . metoCLOPramide (REGLAN) 5 MG tablet Take 5 mg by mouth 3 (three) times daily before meals. (0800, 1200, 1600 & 2000)     . nystatin (MYCOSTATIN/NYSTOP) powder Apply topically 4 (four) times daily. (Patient taking differently: Apply topically 2 (two) times daily. Apply topically to the are under abdomen 2 times a day x 4 days Per Nursing Home [facility]) 15 g 0  . omeprazole (PRILOSEC) 20 MG capsule Take 20 mg by mouth daily.     . ondansetron (ZOFRAN) 4 MG tablet Take 4 mg by mouth 3 (three) times daily before meals.     Marland Kitchen PARoxetine (PAXIL) 10 MG tablet Take 1 tablet (10 mg total) by mouth daily. 30 tablet 1  . potassium chloride SA (K-DUR) 20 MEQ tablet Take 20 mEq by mouth daily.     . RESTASIS 0.05 % ophthalmic emulsion Place 1 drop into both eyes 2 (two) times daily.     Marland Kitchen rOPINIRole (REQUIP) 3 MG tablet Take 3 mg by mouth at bedtime. (2000)    . senna (GERI-KOT) 8.6 MG tablet Take 2 tablets by mouth daily.     . simvastatin (ZOCOR) 10 MG tablet Take 10 mg by mouth at bedtime.     No current facility-administered medications for this visit.     Review of Systems  Constitutional: Negative.  Negative for chills, diaphoresis, fever, malaise/fatigue and weight loss (stable).       She "doesn't feel too  good" today  HENT: Negative.  Negative for congestion, ear pain, hearing loss, nosebleeds, sinus pain and sore throat.   Eyes: Negative.  Negative for blurred vision, double vision, photophobia and pain.  Respiratory: Positive for shortness of breath (exertional, improving). Negative for cough, hemoptysis  and sputum production.   Cardiovascular: Negative.  Negative for chest pain, palpitations, orthopnea, leg swelling and PND.  Gastrointestinal: Positive for abdominal pain (intermittent), diarrhea (intermittent), heartburn (reflux), melena and nausea (intermittent). Negative for blood in stool, constipation and vomiting.  Genitourinary: Negative for dysuria, frequency, hematuria and urgency.  Musculoskeletal: Positive for joint pain (right knee pain, hip). Negative for back pain, falls, myalgias and neck pain.  Skin: Negative.  Negative for itching and rash.  Neurological: Negative.  Negative for dizziness, tremors, sensory change, speech change, focal weakness, weakness and headaches.  Endo/Heme/Allergies: Bruises/bleeds easily.       Diabetes.  Psychiatric/Behavioral: Positive for memory loss (forgetful). Negative for depression and substance abuse. The patient is not nervous/anxious and does not have insomnia.   All other systems reviewed and are negative.  Performance status (ECOG): 2  Vitals Blood pressure (!) 134/57, pulse 60, temperature (!) 97.3 F (36.3 C), temperature source Tympanic, resp. rate 18, weight 215 lb 15.1 oz (98 kg), SpO2 99 %.   Physical Exam  Constitutional: She is oriented to person, place, and time. She appears well-developed and well-nourished. No distress.  She has a rolling walker by her side.  HENT:  Head: Normocephalic and atraumatic.  Mouth/Throat: Oropharynx is clear and moist. No oropharyngeal exudate.  Short gray hair. Wearing a mask.  Eyes: Pupils are equal, round, and reactive to light. Conjunctivae and EOM are normal. No scleral icterus.  Blue eyes.  Left eye injected.    Neck: Normal range of motion. Neck supple. No JVD present.  Cardiovascular: Normal rate, regular rhythm and normal heart sounds.  No murmur heard. Pulmonary/Chest: Effort normal and breath sounds normal. No respiratory distress. She has no wheezes. She has no rales.  Abdominal:  Soft. Bowel sounds are normal. She exhibits no distension and no mass. There is generalized abdominal tenderness (mild). There is no rebound and no guarding.  Musculoskeletal: Normal range of motion.        General: No edema.  Lymphadenopathy:    She has no cervical adenopathy.    She has no axillary adenopathy.       Right: No supraclavicular adenopathy present.       Left: No supraclavicular adenopathy present.  Neurological: She is alert and oriented to person, place, and time.  Skin: Skin is warm and dry. No rash noted. She is not diaphoretic. No erythema.  Psychiatric: She has a normal mood and affect. Her behavior is normal. Judgment and thought content normal.  Nursing note and vitals reviewed.   [facility] Labs: 09/19/2016:  Hematocrit 39.0, hemoglobin 12.6, MCV 94, platelets are 110,000, white count 9000 with an ANC of 7200. Comprehensive metabolic panel included an alkaline phosphatase of 213. Bilirubin, SGOT and SGPT were normal.  Creatinine was normal. Iron studies included a ferritin of 136, iron saturation of 28% and a TIBC of 310. 04/05/2017:  Hematocrit 39.7, hemoglobin 13.3, MCV 96, platelets are 107,000, white count 5700 with an ANC of 4200. Comprehensive metabolic panel included an alkaline phosphatase of 225. Bilirubin, SGOT and SGPT were normal.  Creatinine was 0.78.  Ferritin was  214. PT was 10.9 with an INR of 1.1. 05/19/2017:  Hematocrit of 37.5, hemoglobin 12.4, MCV 96, platelets 117,000,  WBC 5500 with an [place] of 3900.  LFTS revealed an albumen of 3.5, bilirubin 0.6, alkaline phosphatase of 219 (39-117), SGOT 25, SPGT 20.  PT was 10.8 (INR 1.0).  AFP was 4.6.  Ferritin was 329. 07/28/2017:  Alkaline phosphatase 176 (improved). 10/26/2017: Hematocrit 38.0, hemoglobin 13.2, MCV 93, platelets 115,000, white count 6400 with an ANC of 4900.  LFTs revealed a albumin of 4.0, SGOT 27, SGPT 20, alkaline phosphatase 205 and bilirubin 0.6.  PT is 10.7 (INR 1.0).  AFP is 5.1.  02/06/2018:  Sodium 138, potassium 3.4, creatinine 1.25, alkaline phosphatase 142 (39-117), ALT 21, ALT 14, PT 10.9 (INR 1.0).   Appointment on 01/14/2019  Component Date Value Ref Range Status  . Sodium 01/14/2019 137  135 - 145 mmol/L Final  . Potassium 01/14/2019 4.3  3.5 - 5.1 mmol/L Final  . Chloride 01/14/2019 102  98 - 111 mmol/L Final  . CO2 01/14/2019 29  22 - 32 mmol/L Final  . Glucose, Bld 01/14/2019 161* 70 - 99 mg/dL Final  . BUN 01/14/2019 12  8 - 23 mg/dL Final  . Creatinine, Ser 01/14/2019 0.88  0.44 - 1.00 mg/dL Final  . Calcium 01/14/2019 9.1  8.9 - 10.3 mg/dL Final  . Total Protein 01/14/2019 6.8  6.5 - 8.1 g/dL Final  . Albumin 01/14/2019 3.7  3.5 - 5.0 g/dL Final  . AST 01/14/2019 25  15 - 41 U/L Final  . ALT 01/14/2019 15  0 - 44 U/L Final  . Alkaline Phosphatase 01/14/2019 168* 38 - 126 U/L Final  . Total Bilirubin 01/14/2019 0.8  0.3 - 1.2 mg/dL Final  . GFR calc non Af Amer 01/14/2019 >60  >60 mL/min Final  . GFR calc Af Amer 01/14/2019 >60  >60 mL/min Final  . Anion gap 01/14/2019 6  5 - 15 Final   Performed at [facility], [address] [place], [place] [address]  . WBC 01/14/2019 6.0  4.0 - 10.5 K/uL Final  . RBC 01/14/2019 3.95  3.87 - 5.11 MIL/uL Final  . Hemoglobin 01/14/2019 12.5  12.0 - 15.0 g/dL Final  . HCT 01/14/2019 37.8  36.0 - 46.0 % Final  . MCV 01/14/2019 95.7  80.0 - 100.0 fL Final  . MCH 01/14/2019 31.6  26.0 - 34.0 pg Final  . MCHC 01/14/2019 33.1  30.0 - 36.0 g/dL Final  . RDW 01/14/2019 15.3  11.5 - 15.5 % Final  . Platelets 01/14/2019 113* 150 - 400 K/uL Final   Comment: Immature Platelet Fraction may be clinically indicated, consider ordering this additional test [id number]   . nRBC 01/14/2019 0.0  0.0 - 0.2 % Final  . Neutrophils Relative % 01/14/2019 81  % Final  . Neutro Abs 01/14/2019 4.8  1.7 - 7.7 K/uL Final  . Lymphocytes Relative 01/14/2019 8  % Final  . Lymphs Abs 01/14/2019 0.5* 0.7 - 4.0 K/uL Final  .  Monocytes Relative 01/14/2019 8  % Final  . Monocytes Absolute 01/14/2019 0.5  0.1 - 1.0 K/uL Final  . Eosinophils Relative 01/14/2019 2  % Final  . Eosinophils Absolute 01/14/2019 0.1  0.0 - 0.5 K/uL Final  . Basophils Relative 01/14/2019 1  % Final  . Basophils Absolute 01/14/2019 0.0  0.0 - 0.1 K/uL Final  . Immature Granulocytes 01/14/2019 0  % Final  . Abs Immature Granulocytes 01/14/2019 0.02  0.00 - 0.07 K/uL Final   Performed at [facility], [address], [place], [place] [address]  Assessment:  REESHA DEBES is a 68 y.o. 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 Platelets have ranged between 108,000 and 128,000 without trend. She denies any new medications or herbal products.  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  Work-up on 08/29/2014 revealed a positive rheumatoid factor and ANA (anti-double-stranded DNA of 5- equivocal).  Negative studies included hepatitis B surface antibody and antigen, hepatitis C testing, and HIV testing. B12, SPEP, and free light chains were normal.  Additional testing on 10/31/2014 revealed negative hepatitis B core antibody and CMV IgM.  Abdominal ultrasound on 06/27/2014 revealed mild splenomegaly (14.2 cm) with moderate cirrhosis and no focal liver lesions. RUQ ultrasound on 09/29/2016 revealed a nodular liver (cirrhotic) without focal mass.   Abdominal ultrasound on 05/24/2017 revealed hepatic cirrhosis with mild splenomegaly (15.5 cm).  Abdominal ultrasound on 01/08/2018 revealed stable changes of cirrhosis.  RUQ ultrasound on 07/11/2018 revealed a cirrhotic liver, surgically absent gallbladder, and no other abnormalities.  Abdominal ultrasound on 01/09/2019 revealed a cirrhotic appearance of the liver. No focal masses were seen. Portal vein was patent. She has splenomegaly (15.1 cm; volume 689 cm3). Study was limited due to body habitus. She is on  lactulose 3/day.  Chest, abdomen, and pelvic CT on 08/15/2017 revealed no active pulmonary disease, morphologic appearance of cirrhosis with stable splenomegaly.  There were small varices in left upper quadrant.  She has a neural stimulator device.  AFP has been followed: 4 on 04/21/2015, 4.6 on 05/19/2017, 5.1 on 10/26/2017, 3.9 on 01/31/2018, and 4.5 on 01/14/2019.  She denies any alcohol use.  She has a history of recurrent iron deficiency anemia.  Ferritin was 20 on 05/11/2015 and 15 on 11/13/2015. Hematocrit was 29.7 on 11/13/2015.  Last colonoscopy was in 08/2014.  Guaiac cards were negative x 3 in 11/2015.  Diet is good.  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  She is s/p arthroscopic right knee surgery on 11/15/2016.  She is s/p left femur fracture on 03/07/2017.  Alkaline phosphatase is elevated.  She was admitted to [facility] from 07/19/2017 - 07/22/2017 with a group B strept UTI and altered mental status.  Alkaline phosphatase was 143 on 07/19/2017.  Platelet count was 108,000 - 124,000.  She was treated with Augmentin.   Symptomatically, she has a 2 month history of intermittent abdominal discomfort and bloating. She notes intermittent nausea and loss of appetite. She has watery diarrhea alternating with black stools, which she attributes to lactulose. She has reflux.  Exam is stable.  Plan: 1.   Labs today: CBC with diff, CMP, AFP, PT/INR, ferritin, iron studies. 2.   Chronic thrombocytopenia             Platelet count 113,000.             Etiology is secondary to cirrhosis and splenomegaly. 3.   Cirrhosis             Review abdominal ultrasound from 01/09/2019- no focal liver lesions.             Follow-up with GI (Dr. Allen Norris).             Continue AFP and RUQ ultrasound every 6 months.   Next abdominal ultrasound due 07/12/2019. 4.  Abdominal discomfort and black stools  Patient considering going to [facility].           Schedule follow-up with Dr.  Allen Norris ASAP. 5.   Complete  paperwork for [facility] at [facility]. 6.   RTC in 3 months for labs (CBC with diff). 7.   RTC in 6 months for MD assessment and labs CBC with diff, CMP, AFP).  I discussed the assessment and treatment plan with the patient.  The patient was provided an opportunity to ask questions and all were answered.  The patient agreed with the plan and demonstrated an understanding of the instructions.  The patient was advised to call back if the symptoms worsen or if the condition fails to improve as anticipated.  I provided 20 minutes of face-to-face time during this this encounter and > 50% was spent counseling as documented under my assessment and plan.    Lequita Asal, MD, PhD    01/14/2019, 11:40 AM  I, Molly Dorshimer, am acting as Education administrator for [employer]. Mike Gip, MD, PhD.  I,  C. Mike Gip, MD, have reviewed the above documentation for accuracy and completeness, and I agree with the above.

## 2019-01-14 ENCOUNTER — Telehealth: Payer: Self-pay | Admitting: Gastroenterology

## 2019-01-14 ENCOUNTER — Other Ambulatory Visit: Payer: Self-pay

## 2019-01-14 ENCOUNTER — Inpatient Hospital Stay: Payer: Medicare Other

## 2019-01-14 ENCOUNTER — Inpatient Hospital Stay: Payer: Medicare Other | Attending: Hematology and Oncology | Admitting: Hematology and Oncology

## 2019-01-14 ENCOUNTER — Other Ambulatory Visit: Payer: Self-pay | Admitting: Hematology and Oncology

## 2019-01-14 ENCOUNTER — Other Ambulatory Visit: Payer: Medicare Other

## 2019-01-14 ENCOUNTER — Other Ambulatory Visit: Admission: RE | Admit: 2019-01-14 | Payer: Medicare Other | Source: Ambulatory Visit

## 2019-01-14 ENCOUNTER — Encounter: Payer: Self-pay | Admitting: Hematology and Oncology

## 2019-01-14 VITALS — BP 134/57 | HR 60 | Temp 97.3°F | Resp 18 | Wt 215.9 lb

## 2019-01-14 DIAGNOSIS — M1990 Unspecified osteoarthritis, unspecified site: Secondary | ICD-10-CM | POA: Insufficient documentation

## 2019-01-14 DIAGNOSIS — E785 Hyperlipidemia, unspecified: Secondary | ICD-10-CM | POA: Diagnosis not present

## 2019-01-14 DIAGNOSIS — K7460 Unspecified cirrhosis of liver: Secondary | ICD-10-CM | POA: Insufficient documentation

## 2019-01-14 DIAGNOSIS — Z79899 Other long term (current) drug therapy: Secondary | ICD-10-CM | POA: Diagnosis not present

## 2019-01-14 DIAGNOSIS — R197 Diarrhea, unspecified: Secondary | ICD-10-CM | POA: Insufficient documentation

## 2019-01-14 DIAGNOSIS — F319 Bipolar disorder, unspecified: Secondary | ICD-10-CM | POA: Insufficient documentation

## 2019-01-14 DIAGNOSIS — K7581 Nonalcoholic steatohepatitis (NASH): Secondary | ICD-10-CM

## 2019-01-14 DIAGNOSIS — R630 Anorexia: Secondary | ICD-10-CM | POA: Diagnosis not present

## 2019-01-14 DIAGNOSIS — K219 Gastro-esophageal reflux disease without esophagitis: Secondary | ICD-10-CM | POA: Diagnosis not present

## 2019-01-14 DIAGNOSIS — I10 Essential (primary) hypertension: Secondary | ICD-10-CM | POA: Diagnosis not present

## 2019-01-14 DIAGNOSIS — Z8673 Personal history of transient ischemic attack (TIA), and cerebral infarction without residual deficits: Secondary | ICD-10-CM | POA: Diagnosis not present

## 2019-01-14 DIAGNOSIS — D509 Iron deficiency anemia, unspecified: Secondary | ICD-10-CM

## 2019-01-14 DIAGNOSIS — F419 Anxiety disorder, unspecified: Secondary | ICD-10-CM | POA: Insufficient documentation

## 2019-01-14 DIAGNOSIS — G4700 Insomnia, unspecified: Secondary | ICD-10-CM | POA: Insufficient documentation

## 2019-01-14 DIAGNOSIS — F17210 Nicotine dependence, cigarettes, uncomplicated: Secondary | ICD-10-CM | POA: Insufficient documentation

## 2019-01-14 DIAGNOSIS — J449 Chronic obstructive pulmonary disease, unspecified: Secondary | ICD-10-CM | POA: Insufficient documentation

## 2019-01-14 DIAGNOSIS — D696 Thrombocytopenia, unspecified: Secondary | ICD-10-CM | POA: Diagnosis not present

## 2019-01-14 DIAGNOSIS — Z794 Long term (current) use of insulin: Secondary | ICD-10-CM | POA: Insufficient documentation

## 2019-01-14 DIAGNOSIS — R110 Nausea: Secondary | ICD-10-CM | POA: Insufficient documentation

## 2019-01-14 DIAGNOSIS — E119 Type 2 diabetes mellitus without complications: Secondary | ICD-10-CM | POA: Diagnosis not present

## 2019-01-14 DIAGNOSIS — Z86010 Personal history of colonic polyps: Secondary | ICD-10-CM | POA: Diagnosis not present

## 2019-01-14 DIAGNOSIS — K921 Melena: Secondary | ICD-10-CM | POA: Insufficient documentation

## 2019-01-14 DIAGNOSIS — R161 Splenomegaly, not elsewhere classified: Secondary | ICD-10-CM | POA: Insufficient documentation

## 2019-01-14 LAB — CBC WITH DIFFERENTIAL/PLATELET
Abs Immature Granulocytes: 0.02 10*3/uL (ref 0.00–0.07)
Basophils Absolute: 0 10*3/uL (ref 0.0–0.1)
Basophils Relative: 1 %
Eosinophils Absolute: 0.1 10*3/uL (ref 0.0–0.5)
Eosinophils Relative: 2 %
HCT: 37.8 % (ref 36.0–46.0)
Hemoglobin: 12.5 g/dL (ref 12.0–15.0)
Immature Granulocytes: 0 %
Lymphocytes Relative: 8 %
Lymphs Abs: 0.5 10*3/uL — ABNORMAL LOW (ref 0.7–4.0)
MCH: 31.6 pg (ref 26.0–34.0)
MCHC: 33.1 g/dL (ref 30.0–36.0)
MCV: 95.7 fL (ref 80.0–100.0)
Monocytes Absolute: 0.5 10*3/uL (ref 0.1–1.0)
Monocytes Relative: 8 %
Neutro Abs: 4.8 10*3/uL (ref 1.7–7.7)
Neutrophils Relative %: 81 %
Platelets: 113 10*3/uL — ABNORMAL LOW (ref 150–400)
RBC: 3.95 MIL/uL (ref 3.87–5.11)
RDW: 15.3 % (ref 11.5–15.5)
WBC: 6 10*3/uL (ref 4.0–10.5)
nRBC: 0 % (ref 0.0–0.2)

## 2019-01-14 LAB — COMPREHENSIVE METABOLIC PANEL
ALT: 15 U/L (ref 0–44)
AST: 25 U/L (ref 15–41)
Albumin: 3.7 g/dL (ref 3.5–5.0)
Alkaline Phosphatase: 168 U/L — ABNORMAL HIGH (ref 38–126)
Anion gap: 6 (ref 5–15)
BUN: 12 mg/dL (ref 8–23)
CO2: 29 mmol/L (ref 22–32)
Calcium: 9.1 mg/dL (ref 8.9–10.3)
Chloride: 102 mmol/L (ref 98–111)
Creatinine, Ser: 0.88 mg/dL (ref 0.44–1.00)
GFR calc Af Amer: >60 mL/min (ref >60)
GFR calc non Af Amer: >60 mL/min (ref >60)
Glucose, Bld: 161 mg/dL — ABNORMAL HIGH (ref 70–99)
Potassium: 4.3 mmol/L (ref 3.5–5.1)
Sodium: 137 mmol/L (ref 135–145)
Total Bilirubin: 0.8 mg/dL (ref 0.3–1.2)
Total Protein: 6.8 g/dL (ref 6.5–8.1)

## 2019-01-14 LAB — PROTIME-INR
INR: 1 (ref 0.8–1.2)
Prothrombin Time: 13.2 seconds (ref 11.4–15.2)

## 2019-01-14 LAB — IRON AND TIBC
Iron: 67 ug/dL (ref 28–170)
Saturation Ratios: 25 % (ref 10.4–31.8)
TIBC: 265 ug/dL (ref 250–450)
UIBC: 198 ug/dL

## 2019-01-14 LAB — FERRITIN: Ferritin: 92 ng/mL (ref 11–307)

## 2019-01-14 NOTE — Telephone Encounter (Signed)
Left vm with Lakicha from [place] to schedule apt for pt to see Dr. Allen Norris

## 2019-01-14 NOTE — Progress Notes (Signed)
Pt here for follow up. Reports abdominal discomfort that comes and goes x 2 months. Patient is also experiencing Nausea and Diarrhea that comes and goes x 2 months. Patient is currently under the care of her PCP for this and is having multiple test for this.

## 2019-01-15 LAB — AFP TUMOR MARKER: AFP, Serum, Tumor Marker: 4.5 ng/mL (ref 0.0–8.3)

## 2019-01-16 ENCOUNTER — Other Ambulatory Visit
Admission: RE | Admit: 2019-01-16 | Discharge: 2019-01-16 | Disposition: A | Discharge status: Home / Self Care | Payer: Medicare Other | Source: Ambulatory Visit | Attending: Neurology | Admitting: Neurology

## 2019-01-16 ENCOUNTER — Other Ambulatory Visit: Payer: Self-pay

## 2019-01-16 DIAGNOSIS — Z20828 Contact with and (suspected) exposure to other viral communicable diseases: Secondary | ICD-10-CM | POA: Insufficient documentation

## 2019-01-16 DIAGNOSIS — Z01812 Encounter for preprocedural laboratory examination: Secondary | ICD-10-CM | POA: Diagnosis present

## 2019-01-16 LAB — SARS CORONAVIRUS 2 (TAT 6-24 HRS): SARS Coronavirus 2: NEGATIVE

## 2019-01-17 ENCOUNTER — Ambulatory Visit: Payer: Medicare Other | Attending: Neurology

## 2019-01-17 DIAGNOSIS — G4733 Obstructive sleep apnea (adult) (pediatric): Secondary | ICD-10-CM | POA: Diagnosis not present

## 2019-01-18 ENCOUNTER — Other Ambulatory Visit: Payer: Self-pay

## 2019-02-04 ENCOUNTER — Ambulatory Visit (INDEPENDENT_AMBULATORY_CARE_PROVIDER_SITE_OTHER): Payer: Medicare Other | Admitting: Gastroenterology

## 2019-02-04 ENCOUNTER — Other Ambulatory Visit: Payer: Self-pay

## 2019-02-04 ENCOUNTER — Encounter: Payer: Self-pay | Admitting: Gastroenterology

## 2019-02-04 VITALS — BP 122/79 | HR 70 | Temp 96.7°F | Ht 63.0 in | Wt 210.6 lb

## 2019-02-04 DIAGNOSIS — K7460 Unspecified cirrhosis of liver: Secondary | ICD-10-CM

## 2019-02-04 DIAGNOSIS — D500 Iron deficiency anemia secondary to blood loss (chronic): Secondary | ICD-10-CM | POA: Diagnosis not present

## 2019-02-04 DIAGNOSIS — R197 Diarrhea, unspecified: Secondary | ICD-10-CM | POA: Diagnosis not present

## 2019-02-04 NOTE — Progress Notes (Signed)
Primary Care Physician: Odessa Fleming, NP  Primary Gastroenterologist:  Dr. Lucilla Lame  Chief Complaint  Patient presents with  . Stomach issues    HPI: Stephanie [name] is a 68 y.o. female here for follow-up.  Patient has a history of cirrhosis.  The patient also has been having significant diarrhea.  There is no report of any unexplained weight loss.  The patient has also not had any recent EGD to evaluate her for varices.  She did have imaging that was suggestive of varices.  The patient does report some black stools but denies any bright red blood per rectum.  She states that her stools are usually very pale in color.  Current Outpatient Medications  Medication Sig Dispense Refill  . albuterol (PROVENTIL HFA;VENTOLIN HFA) 108 (90 Base) MCG/ACT inhaler Inhale 2 puffs into the lungs every 6 (six) hours as needed for wheezing or shortness of breath.    Marland Kitchen albuterol (PROVENTIL) (2.5 MG/3ML) 0.083% nebulizer solution Take 2.5 mg by nebulization every 6 (six) hours as needed for wheezing or shortness of breath.    Marland Kitchen buPROPion (WELLBUTRIN SR) 100 MG 12 hr tablet Take 1 tablet (100 mg total) by mouth daily after breakfast. 90 tablet 0  . clonazePAM (KLONOPIN) 0.25 MG disintegrating tablet Take 0.25 mg by mouth 2 (two) times daily.     . diclofenac sodium (VOLTAREN) 1 % GEL Apply topically 3 (three) times daily.    Marland Kitchen dicyclomine (BENTYL) 10 MG capsule Take 10 mg by mouth 3 (three) times daily.     . diphenhydrAMINE (BENADRYL) 25 mg capsule Take 25 mg by mouth every 6 (six) hours as needed.    . donepezil (ARICEPT) 10 MG tablet Take 10 mg by mouth at bedtime.     . ferrous sulfate 325 (65 FE) MG tablet Take 325 mg by mouth 2 (two) times daily.     . furosemide (LASIX) 80 MG tablet Take 40 mg by mouth daily.     Marland Kitchen gabapentin (NEURONTIN) 300 MG capsule Take 1 capsule (300 mg total) by mouth 3 (three) times daily. Takes 300 mg at 8 am , 2pm, 300 mg 8 pm. 270 capsule 1  .  HYDROcodone-acetaminophen (NORCO) 7.5-325 MG tablet Take 1 tablet by mouth every 6 (six) hours as needed for moderate pain.    Marland Kitchen insulin glargine (LANTUS) 100 UNIT/ML injection Inject 42 Units into the skin every evening.     . insulin lispro (HUMALOG) 100 UNIT/ML injection Inject 8 Units into the skin 3 (three) times daily before meals.     . lactulose (CHRONULAC) 10 GM/15ML solution Take 10 g by mouth daily. (0800)    . lamoTRIgine (LAMICTAL) 150 MG tablet Take 1 tablet (150 mg total) by mouth 2 (two) times daily. 180 tablet 0  . lidocaine (LIDODERM) 5 % Place 1 patch onto the skin daily.     Marland Kitchen liraglutide (VICTOZA) 18 MG/3ML SOPN Inject 1.8 mg into the skin daily.    Marland Kitchen loperamide (IMODIUM) 2 MG capsule Take 4 mg by mouth as needed for diarrhea or loose stools.    . metoCLOPramide (REGLAN) 5 MG tablet Take 5 mg by mouth 3 (three) times daily before meals. (0800, 1200, 1600 & 2000)     . nystatin (MYCOSTATIN/NYSTOP) powder Apply topically 4 (four) times daily. (Patient taking differently: Apply topically 2 (two) times daily. Apply topically to the are under abdomen 2 times a day x 4 days Per Nursing Home [facility]) 15 g 0  .  omeprazole (PRILOSEC) 20 MG capsule Take 20 mg by mouth daily.     . ondansetron (ZOFRAN) 4 MG tablet Take 4 mg by mouth 3 (three) times daily before meals.     Marland Kitchen PARoxetine (PAXIL) 10 MG tablet Take 1 tablet (10 mg total) by mouth daily. 30 tablet 1  . potassium chloride SA (K-DUR) 20 MEQ tablet Take 20 mEq by mouth daily.     . RESTASIS 0.05 % ophthalmic emulsion Place 1 drop into both eyes 2 (two) times daily.     Marland Kitchen rOPINIRole (REQUIP) 3 MG tablet Take 3 mg by mouth at bedtime. (2000)    . senna (GERI-KOT) 8.6 MG tablet Take 2 tablets by mouth daily.     . simvastatin (ZOCOR) 10 MG tablet Take 10 mg by mouth at bedtime.     No current facility-administered medications for this visit.     Allergies as of 02/04/2019 - Review Complete 02/04/2019  Allergen Reaction Noted  .  Cortisone  07/13/2018  . Doxycycline Hives, Swelling, Other (See Comments), and Nausea Only 05/05/2014    ROS:  General: Negative for anorexia, weight loss, fever, chills, fatigue, weakness. ENT: Negative for hoarseness, difficulty swallowing , nasal congestion. CV: Negative for chest pain, angina, palpitations, dyspnea on exertion, peripheral edema.  Respiratory: Negative for dyspnea at rest, dyspnea on exertion, cough, sputum, wheezing.  GI: See history of present illness. GU:  Negative for dysuria, hematuria, urinary incontinence, urinary frequency, nocturnal urination.  Endo: Negative for unusual weight change.    Physical Examination:   BP 122/79   Pulse 70   Temp (!) 96.7 F (35.9 C) (Temporal)   Ht 5' 3"  (1.6 m)   Wt 210 lb 9.6 oz (95.5 kg)   BMI 37.31 kg/m   General: Well-nourished, well-developed in no acute distress.  Eyes: No icterus. Conjunctivae pink. Extremities: No lower extremity edema. No clubbing or deformities. Neuro: Alert and oriented x 3.  Grossly intact. Skin: Warm and dry, no jaundice.   Psych: Alert and cooperative, normal mood and affect.  Labs:    Imaging Studies: US Abdomen Complete  Result Date: 01/09/2019 CLINICAL DATA:  Cirrhosis, thrombocytopenia, splenomegaly. EXAM: ABDOMEN ULTRASOUND COMPLETE COMPARISON:  July 11, 2018 FINDINGS: Gallbladder: Surgically absent gallbladder. Common bile duct: Diameter: 5 mm Liver: Nodular contour with increased heterogeneous echogenicity. No focal lesions seen, given the limitations of this study. Portal vein is patent on color Doppler imaging with normal direction of blood flow towards the liver. IVC: No abnormality visualized. Pancreas: Visualized portion unremarkable. Spleen: Splenomegaly. The spleen measures 15.1 cm in length. Volumes 689 cubic cm. Right Kidney: Length: 10.2 cm. Echogenicity within normal limits. No mass or hydronephrosis visualized. Left Kidney: Length: 10.2 cm. Echogenicity within normal  limits. No mass or hydronephrosis visualized. Abdominal aorta: No aneurysm visualized. Other findings: None. IMPRESSION: Study limited due to body habitus. Cirrhotic appearance of the liver. No focal masses seen. Patent portal vein. Splenomegaly. Electronically Signed   By: Fidela Salisbury M.D.   On: 01/09/2019 14:58    Assessment and Plan:   VERNICA WACHTEL is a 68 y.o. y/o female who comes in today with a history of cirrhosis and needs surveillance for hepatocellular carcinoma.  The patient will be set up for an EGD to rule out varices.  She will also be set up for a colonoscopy due to her diarrhea and an adenoma at her colonoscopy on August 27, 2014. I have discussed risks & benefits which include, but are not limited to,  bleeding, infection, perforation & drug reaction.  The patient agrees with this plan & written consent will be obtained.       Lucilla Lame, MD. [facility]   Note: This dictation was prepared with Dragon dictation along with smaller phrase technology. Any transcriptional errors that result from this process are unintentional.

## 2019-02-04 NOTE — H&P (View-Only) (Signed)
Primary Care Physician: Odessa Fleming, NP  Primary Gastroenterologist:  Dr. Lucilla Lame  Chief Complaint  Patient presents with  . Stomach issues    HPI: Stephanie [name] is a 68 y.o. female here for follow-up.  Patient has a history of cirrhosis.  The patient also has been having significant diarrhea.  There is no report of any unexplained weight loss.  The patient has also not had any recent EGD to evaluate her for varices.  She did have imaging that was suggestive of varices.  The patient does report some black stools but denies any bright red blood per rectum.  She states that her stools are usually very pale in color.  Current Outpatient Medications  Medication Sig Dispense Refill  . albuterol (PROVENTIL HFA;VENTOLIN HFA) 108 (90 Base) MCG/ACT inhaler Inhale 2 puffs into the lungs every 6 (six) hours as needed for wheezing or shortness of breath.    Marland Kitchen albuterol (PROVENTIL) (2.5 MG/3ML) 0.083% nebulizer solution Take 2.5 mg by nebulization every 6 (six) hours as needed for wheezing or shortness of breath.    Marland Kitchen buPROPion (WELLBUTRIN SR) 100 MG 12 hr tablet Take 1 tablet (100 mg total) by mouth daily after breakfast. 90 tablet 0  . clonazePAM (KLONOPIN) 0.25 MG disintegrating tablet Take 0.25 mg by mouth 2 (two) times daily.     . diclofenac sodium (VOLTAREN) 1 % GEL Apply topically 3 (three) times daily.    Marland Kitchen dicyclomine (BENTYL) 10 MG capsule Take 10 mg by mouth 3 (three) times daily.     . diphenhydrAMINE (BENADRYL) 25 mg capsule Take 25 mg by mouth every 6 (six) hours as needed.    . donepezil (ARICEPT) 10 MG tablet Take 10 mg by mouth at bedtime.     . ferrous sulfate 325 (65 FE) MG tablet Take 325 mg by mouth 2 (two) times daily.     . furosemide (LASIX) 80 MG tablet Take 40 mg by mouth daily.     Marland Kitchen gabapentin (NEURONTIN) 300 MG capsule Take 1 capsule (300 mg total) by mouth 3 (three) times daily. Takes 300 mg at 8 am , 2pm, 300 mg 8 pm. 270 capsule 1  .  HYDROcodone-acetaminophen (NORCO) 7.5-325 MG tablet Take 1 tablet by mouth every 6 (six) hours as needed for moderate pain.    Marland Kitchen insulin glargine (LANTUS) 100 UNIT/ML injection Inject 42 Units into the skin every evening.     . insulin lispro (HUMALOG) 100 UNIT/ML injection Inject 8 Units into the skin 3 (three) times daily before meals.     . lactulose (CHRONULAC) 10 GM/15ML solution Take 10 g by mouth daily. (0800)    . lamoTRIgine (LAMICTAL) 150 MG tablet Take 1 tablet (150 mg total) by mouth 2 (two) times daily. 180 tablet 0  . lidocaine (LIDODERM) 5 % Place 1 patch onto the skin daily.     Marland Kitchen liraglutide (VICTOZA) 18 MG/3ML SOPN Inject 1.8 mg into the skin daily.    Marland Kitchen loperamide (IMODIUM) 2 MG capsule Take 4 mg by mouth as needed for diarrhea or loose stools.    . metoCLOPramide (REGLAN) 5 MG tablet Take 5 mg by mouth 3 (three) times daily before meals. (0800, 1200, 1600 & 2000)     . nystatin (MYCOSTATIN/NYSTOP) powder Apply topically 4 (four) times daily. (Patient taking differently: Apply topically 2 (two) times daily. Apply topically to the are under abdomen 2 times a day x 4 days Per Nursing Home [facility]) 15 g 0  .  omeprazole (PRILOSEC) 20 MG capsule Take 20 mg by mouth daily.     . ondansetron (ZOFRAN) 4 MG tablet Take 4 mg by mouth 3 (three) times daily before meals.     Marland Kitchen PARoxetine (PAXIL) 10 MG tablet Take 1 tablet (10 mg total) by mouth daily. 30 tablet 1  . potassium chloride SA (K-DUR) 20 MEQ tablet Take 20 mEq by mouth daily.     . RESTASIS 0.05 % ophthalmic emulsion Place 1 drop into both eyes 2 (two) times daily.     Marland Kitchen rOPINIRole (REQUIP) 3 MG tablet Take 3 mg by mouth at bedtime. (2000)    . senna (GERI-KOT) 8.6 MG tablet Take 2 tablets by mouth daily.     . simvastatin (ZOCOR) 10 MG tablet Take 10 mg by mouth at bedtime.     No current facility-administered medications for this visit.     Allergies as of 02/04/2019 - Review Complete 02/04/2019  Allergen Reaction Noted  .  Cortisone  07/13/2018  . Doxycycline Hives, Swelling, Other (See Comments), and Nausea Only 05/05/2014    ROS:  General: Negative for anorexia, weight loss, fever, chills, fatigue, weakness. ENT: Negative for hoarseness, difficulty swallowing , nasal congestion. CV: Negative for chest pain, angina, palpitations, dyspnea on exertion, peripheral edema.  Respiratory: Negative for dyspnea at rest, dyspnea on exertion, cough, sputum, wheezing.  GI: See history of present illness. GU:  Negative for dysuria, hematuria, urinary incontinence, urinary frequency, nocturnal urination.  Endo: Negative for unusual weight change.    Physical Examination:   BP 122/79   Pulse 70   Temp (!) 96.7 F (35.9 C) (Temporal)   Ht 5' 3"  (1.6 m)   Wt 210 lb 9.6 oz (95.5 kg)   BMI 37.31 kg/m   General: Well-nourished, well-developed in no acute distress.  Eyes: No icterus. Conjunctivae pink. Extremities: No lower extremity edema. No clubbing or deformities. Neuro: Alert and oriented x 3.  Grossly intact. Skin: Warm and dry, no jaundice.   Psych: Alert and cooperative, normal mood and affect.  Labs:    Imaging Studies: US Abdomen Complete  Result Date: 01/09/2019 CLINICAL DATA:  Cirrhosis, thrombocytopenia, splenomegaly. EXAM: ABDOMEN ULTRASOUND COMPLETE COMPARISON:  July 11, 2018 FINDINGS: Gallbladder: Surgically absent gallbladder. Common bile duct: Diameter: 5 mm Liver: Nodular contour with increased heterogeneous echogenicity. No focal lesions seen, given the limitations of this study. Portal vein is patent on color Doppler imaging with normal direction of blood flow towards the liver. IVC: No abnormality visualized. Pancreas: Visualized portion unremarkable. Spleen: Splenomegaly. The spleen measures 15.1 cm in length. Volumes 689 cubic cm. Right Kidney: Length: 10.2 cm. Echogenicity within normal limits. No mass or hydronephrosis visualized. Left Kidney: Length: 10.2 cm. Echogenicity within normal  limits. No mass or hydronephrosis visualized. Abdominal aorta: No aneurysm visualized. Other findings: None. IMPRESSION: Study limited due to body habitus. Cirrhotic appearance of the liver. No focal masses seen. Patent portal vein. Splenomegaly. Electronically Signed   By: Fidela Salisbury M.D.   On: 01/09/2019 14:58    Assessment and Plan:   Stephanie [name] is a 25 y.o. y/o female who comes in today with a history of cirrhosis and needs surveillance for hepatocellular carcinoma.  The patient will be set up for an EGD to rule out varices.  She will also be set up for a colonoscopy due to her diarrhea and an adenoma at her colonoscopy on August 27, 2014. I have discussed risks & benefits which include, but are not limited to,  bleeding, infection, perforation & drug reaction.  The patient agrees with this plan & written consent will be obtained.       Lucilla Lame, MD. [facility]   Note: This dictation was prepared with Dragon dictation along with smaller phrase technology. Any transcriptional errors that result from this process are unintentional.

## 2019-02-12 ENCOUNTER — Encounter: Payer: Self-pay | Admitting: Psychiatry

## 2019-02-12 ENCOUNTER — Ambulatory Visit (INDEPENDENT_AMBULATORY_CARE_PROVIDER_SITE_OTHER): Payer: Medicare Other | Admitting: Psychiatry

## 2019-02-12 ENCOUNTER — Other Ambulatory Visit: Payer: Self-pay

## 2019-02-12 DIAGNOSIS — F411 Generalized anxiety disorder: Secondary | ICD-10-CM

## 2019-02-12 DIAGNOSIS — F3130 Bipolar disorder, current episode depressed, mild or moderate severity, unspecified: Secondary | ICD-10-CM

## 2019-02-12 DIAGNOSIS — F5105 Insomnia due to other mental disorder: Secondary | ICD-10-CM | POA: Diagnosis not present

## 2019-02-12 NOTE — Progress Notes (Signed)
Virtual Visit via Video Note  I connected with Stephanie [name] on 02/12/19 at 10:00 AM EDT by a video enabled telemedicine application and verified that I am speaking with the correct person using two identifiers.   I discussed the limitations of evaluation and management by telemedicine and the availability of in person appointments. The patient expressed understanding and agreed to proceed.  I discussed the assessment and treatment plan with the patient. The patient was provided an opportunity to ask questions and all were answered. The patient agreed with the plan and demonstrated an understanding of the instructions.   The patient was advised to call back or seek an in-person evaluation if the symptoms worsen or if the condition fails to improve as anticipated.  [place] MD OP Progress Note  02/12/2019 11:11 AM SERI KIMMER  MRN:  [id number]  Chief Complaint:  Chief Complaint    Follow-up     HPI: Stephanie [name] is a 68 year old Caucasian female, divorced, lives at home at [facility] assisted living facility, on SSD, has a history of bipolar disorder, GAD, restless leg syndrome, chronic pain, fibromyalgia, insomnia, COPD, MCI, history of lung nodules, sleep apnea, ascites, history of hepatitis B, history of stroke, vitamin D deficiency, aortic dissection, thrombocytopenia, chronic anemia, hypertension, hyperlipidemia was evaluated by telemedicine today.  Patient currently lives at an assisted living [facility], staff at assisted living facility provided collateral information.  Video call was initiated however it had to be changed to a phone call due to connection problem.  Patient today reports she is currently doing well on the current medication regimen.  She denies any significant anxiety or depressive symptoms.  She reports sleep is good.  She does report tremors.  Her tremors are chronic.  She is currently under the care of neurology as well as pain provider.  She also has arthritis,  fibromyalgia and chronic pain which could also be contributing to the tremors.  Patient denies any suicidality, homicidality or perceptual disturbances.  Patient reports her gabapentin dosage was increased to 300 mg twice a day and 600 mg at bedtime recently.  Discussed with patient that gabapentin also will help with her anxiety and to stay on that dosage at this time.   Visit Diagnosis:    ICD-10-CM   1. Bipolar I disorder, most recent episode depressed ([place])  F31.30   2. GAD (generalized anxiety disorder)  F41.1   3. Insomnia due to mental disorder  F51.05     Past Psychiatric History: I have reviewed past psychiatric history from my progress note on 12/23/2017.  Past trials of Seroquel, trazodone, Ambien  Past Medical History:  Past Medical History:  Diagnosis Date  . Adenomatous colon polyp 08/27/2014   Polyps x 3  . Anemia   . Anxiety   . 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 Arthritis   . Asthma   . Bipolar affective disorder ([place])    h/o  . Blood transfusion 2000  . Blood transfusion without reported diagnosis   . Cancer ([place])   . Chronic abdominal pain    abdominal wall pain  . 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 Colon polyp   . COPD (chronic obstructive pulmonary disease) ([place])   . Depression   . Diabetes mellitus without complication ([place])   . Fatty liver 04/09/08   found in abd CT  . GERD (gastroesophageal reflux disease)   . H/O: CVA (cardiovascular accident)    TIA  . H/O: rheumatic  fever   . Hepatitis    HEP "B" twenty years ago  . Hyperlipidemia   . Hypertension   . IBS (irritable bowel syndrome)   . Incontinence   . Insomnia   . Lower extremity edema   . Neuropathy    lower legs D/T DM  . Obstructive sleep apnea    Use C-PAP twice per week  . Platelets decreased ([place])   . RLS (restless legs syndrome)   . Shortness of breath   . Sleep apnea   . Stroke [facility])    TIA- 2002  . Ulcer   . Unspecified disorders of nervous system   . Wears  dentures    full upper and lower    Past Surgical History:  Procedure Laterality Date  . ABDOMINAL EXPLORATION SURGERY    . ABDOMINAL HYSTERECTOMY     total  . Axillary artery cannulation via 8-mm Hemashield graft, median sternotomy, extracorporeal circulation with deep hypothermic circulatory arrest, repair of  aortic dessection  01/23/2009   Dr Arlyce Dice  . BACK SURGERY      x 5  . cataract     both eyes  . CHOLECYSTECTOMY    . COLONOSCOPY    . EYE SURGERY Bilateral    Cataract Extraction with IOL  . FACIAL LACERATION REPAIR Left 04/11/2018   Procedure: EARLOBE REPAIR;  Surgeon: Carloyn Manner, MD;  Location: [facility];  Service: ENT;  Laterality: Left;  Diabetic - insulin and oral meds sleep apnea  . HEMORROIDECTOMY     and colon polyp removed  . KNEE ARTHROSCOPY WITH LATERAL RELEASE Right 11/15/2016   Procedure: KNEE ARTHROSCOPY WITH LATERAL RELEASE;  Surgeon: Corky Mull, MD;  Location: [facility];  Service: Orthopedics;  Laterality: Right;  . LIVER BIOPSY  04/10/2012   Procedure: LIVER BIOPSY;  Surgeon: Inda Castle, MD;  Location: [facility];  Service: Endoscopy;  Laterality: N/A;  ultrasound to mark order for abd limited/liver to be marked to be sent by [email or web address]   . LUMBAR FUSION  10/09  . LUMBAR WOUND DEBRIDEMENT  05/09/2011   Procedure: LUMBAR WOUND DEBRIDEMENT;  Surgeon: Dahlia Bailiff;  Location: [facility];  Service: Orthopedics;  Laterality: N/A;  IRRIGATION AND DEBRIDEMENT SPINAL WOUND  . POSTERIOR CERVICAL FUSION/FORAMINOTOMY    . ROTATOR CUFF REPAIR     left    Family Psychiatric History: I have reviewed family psychiatric history from my progress note on 12/23/2017 Family History:  Family History  Problem Relation Age of Onset  . Breast cancer Mother   . Lung cancer Mother   . Stomach cancer Father   . Diabetes Other        4 aunts, and 1 uncle  . Depression Brother   . Anxiety disorder Brother   . Colon cancer Neg Hx   . Esophageal cancer  Neg Hx   . Rectal cancer Neg Hx     Social History: I have reviewed social history from my progress note from 12/23/2017 Social History   Socioeconomic History  . Marital status: Divorced    Spouse name: Not on file  . Number of children: 2  . Years of education: Not on file  . Highest education level: 8th grade  Occupational History  . Occupation: Disabled    Employer: [employer]  Social Needs  . Financial resource strain: Not hard at all  . Food insecurity    Worry: Never true    Inability: Never true  . Transportation needs    Medical:  No    Non-medical: No  Tobacco Use  . Smoking status: Former Smoker    Packs/day: 1.50    Years: 50.00    Pack years: 75.00    Types: Cigarettes    Quit date: 04/06/2013    Years since quitting: 5.8  . Smokeless tobacco: Never Used  Substance and Sexual Activity  . Alcohol use: No    Alcohol/week: 0.0 standard drinks  . Drug use: No  . Sexual activity: Never  Lifestyle  . Physical activity    Days per week: 0 days    Minutes per session: 0 min  . Stress: Very much  Relationships  . Social Herbalist on phone: Not on file    Gets together: Not on file    Attends religious service: Never    Active member of club or organization: No    Attends meetings of clubs or organizations: Never    Relationship status: Divorced  Other Topics Concern  . Not on file  Social History Narrative   1 son, 15+ y/o   Daily caffeine use: 2 cups daily   Does not get regular exercise   Disability due to bipolar      Has a living will- she is a DNR (she has form at home per pt).    Allergies:  Allergies  Allergen Reactions  . Cortisone     Increased BS  . Doxycycline Hives, Swelling, Other (See Comments) and Nausea Only    Reaction:  Facial swelling  Face red and swollen; no difficulty breathing    Metabolic Disorder Labs: Lab Results  Component Value Date   HGBA1C 5.6 11/20/2018   MPG 114.02 11/20/2018   MPG 103 07/20/2016    No results found for: PROLACTIN Lab Results  Component Value Date   CHOL 181 11/20/2018   TRIG 59 11/20/2018   HDL 97 11/20/2018   CHOLHDL 1.9 11/20/2018   VLDL 12 11/20/2018   LDLCALC 72 11/20/2018   LDLCALC 75 04/05/2016   Lab Results  Component Value Date   TSH 1.583 11/20/2018   TSH 4.486 07/19/2017    Therapeutic Level Labs: No results found for: LITHIUM No results found for: VALPROATE No components found for:  CBMZ  Current Medications: Current Outpatient Medications  Medication Sig Dispense Refill  . albuterol (PROVENTIL HFA;VENTOLIN HFA) 108 (90 Base) MCG/ACT inhaler Inhale 2 puffs into the lungs every 6 (six) hours as needed for wheezing or shortness of breath.    Marland Kitchen albuterol (PROVENTIL) (2.5 MG/3ML) 0.083% nebulizer solution Take 2.5 mg by nebulization every 6 (six) hours as needed for wheezing or shortness of breath.    Marland Kitchen buPROPion (WELLBUTRIN SR) 100 MG 12 hr tablet Take 1 tablet (100 mg total) by mouth daily after breakfast. 90 tablet 0  . clonazePAM (KLONOPIN) 0.25 MG disintegrating tablet Take 0.25 mg by mouth 2 (two) times daily.     . diclofenac sodium (VOLTAREN) 1 % GEL Apply topically 3 (three) times daily.    Marland Kitchen dicyclomine (BENTYL) 10 MG capsule Take 10 mg by mouth 3 (three) times daily.     . diphenhydrAMINE (BENADRYL) 25 mg capsule Take 25 mg by mouth every 6 (six) hours as needed.    . donepezil (ARICEPT) 10 MG tablet Take 10 mg by mouth at bedtime.     . ferrous sulfate 325 (65 FE) MG tablet Take 325 mg by mouth 2 (two) times daily.     . furosemide (LASIX) 80 MG tablet Take  40 mg by mouth daily.     Marland Kitchen gabapentin (NEURONTIN) 300 MG capsule Take 1 capsule (300 mg total) by mouth 3 (three) times daily. Takes 300 mg at 8 am , 2pm, 300 mg 8 pm. (Patient taking differently: Take 300 mg by mouth 3 (three) times daily. Takes 300 mg  BID AND 600 mg at pm) 270 capsule 1  . HYDROcodone-acetaminophen (NORCO) 7.5-325 MG tablet Take 1 tablet by mouth every 6 (six)  hours as needed for moderate pain.    Marland Kitchen insulin glargine (LANTUS) 100 UNIT/ML injection Inject 42 Units into the skin every evening.     . insulin lispro (HUMALOG) 100 UNIT/ML injection Inject 8 Units into the skin 3 (three) times daily before meals.     . lactulose (CHRONULAC) 10 GM/15ML solution Take 10 g by mouth daily. (0800)    . lamoTRIgine (LAMICTAL) 150 MG tablet Take 1 tablet (150 mg total) by mouth 2 (two) times daily. 180 tablet 0  . lidocaine (LIDODERM) 5 % Place 1 patch onto the skin daily.     Marland Kitchen liraglutide (VICTOZA) 18 MG/3ML SOPN Inject 1.8 mg into the skin daily.    Marland Kitchen loperamide (IMODIUM) 2 MG capsule Take 4 mg by mouth as needed for diarrhea or loose stools.    . metoCLOPramide (REGLAN) 5 MG tablet Take 5 mg by mouth 3 (three) times daily before meals. (0800, 1200, 1600 & 2000)     . nystatin (MYCOSTATIN/NYSTOP) powder Apply topically 4 (four) times daily. (Patient taking differently: Apply topically 2 (two) times daily. Apply topically to the are under abdomen 2 times a day x 4 days Per Nursing Home [facility]) 15 g 0  . omeprazole (PRILOSEC) 20 MG capsule Take 20 mg by mouth daily.     . ondansetron (ZOFRAN) 4 MG tablet Take 4 mg by mouth 3 (three) times daily before meals.     Marland Kitchen PARoxetine (PAXIL) 10 MG tablet Take 1 tablet (10 mg total) by mouth daily. 30 tablet 1  . potassium chloride SA (K-DUR) 20 MEQ tablet Take 20 mEq by mouth daily.     . pregabalin (LYRICA) 75 MG capsule     . RESTASIS 0.05 % ophthalmic emulsion Place 1 drop into both eyes 2 (two) times daily.     Marland Kitchen rOPINIRole (REQUIP) 3 MG tablet Take 3 mg by mouth at bedtime. (2000)    . senna (GERI-KOT) 8.6 MG tablet Take 2 tablets by mouth daily.     . simvastatin (ZOCOR) 10 MG tablet Take 10 mg by mouth at bedtime.     No current facility-administered medications for this visit.      Musculoskeletal: Strength & Muscle Tone: UTA Gait & Station: Wheel chair  Patient leans: N/A  Psychiatric Specialty Exam: Review  of Systems  Neurological: Positive for tremors.  Psychiatric/Behavioral: The patient is nervous/anxious.   All other systems reviewed and are negative.   There were no vitals taken for this visit.There is no height or weight on file to calculate BMI.  General Appearance: UTA  Eye Contact:  UTA  Speech:  Clear and Coherent  Volume:  Normal  Mood:  Anxious  Affect:  UTA  Thought Process:  Goal Directed and Descriptions of Associations: Intact  Orientation:  Full (Time, Place, and Person)  Thought Content: Logical   Suicidal Thoughts:  No  Homicidal Thoughts:  No  Memory:  Immediate;   Fair Recent;   Fair Remote;   Fair  Judgement:  Fair  Insight:  Fair  Psychomotor Activity:  Tremor  Concentration:  Concentration: Fair and Attention Span: Fair  Recall:  [employer] of Knowledge: Fair  Language: Fair  Akathisia:  No  Handed:  Right  AIMS (if indicated): tremors of BL LE - chronic   Assets:  Communication Skills Desire for Improvement Social Support  ADL's:  Intact  Cognition: WNL  Sleep:  Fair   Screenings: PHQ2-9     Clinical Support from 01/04/2018 in [facility] Clinical Support from 11/16/2017 in [facility] Office Visit from 10/18/2017 in [facility] Procedure visit from 10/04/2017 in [facility] Office Visit from 09/25/2017 in [facility]  PHQ-2 Total Score  0  0  1  0  0       Assessment and Plan: Stephanie [name] is a 40 year old Caucasian female who has a history of bipolar disorder, GAD, insomnia, restless leg syndrome as well as multiple medical problems was evaluated by telemedicine today.  A video call was initiated however due to connection problem it had to be changed to a phone call.  Staff at [facility] facility[facility] provided collateral information.   Patient is currently struggling with tremors which are chronic, otherwise reports mood as fair.  Plan as noted below.  Plan Bipolar disorder- in partial remission Lamotrigine 150 mg p.o. twice daily Wellbutrin SR 100 mg p.o. daily-reduced dosage  GAD- improving Gabapentin 300 mg twice a day and 600 mg at bedtime. Paxil 10 mg p.o. daily Klonopin 0.25 mg p.o. twice daily Patient to continue to work with Ms. Peacock.  Insomnia-improving Will monitor closely.  Tremors-chronic She will continue to work with neurology  Cognitive problems-chronic She will continue to work with her neurologist.  Collateral information was obtained from [place] at assisted living facility.  Follow-up in clinic in 2 to 3 months or sooner if needed.  December 2 at 10:30 AM  I have spent atleast 15 minutes non  face to face with patient today. More than 50 % of the time was spent for psychoeducation and supportive psychotherapy and care coordination. This note was generated in part or whole with voice recognition software. Voice recognition is usually quite accurate but there are transcription errors that can and very often do occur. I apologize for any typographical errors that were not detected and corrected.       Ursula Alert, MD 02/12/2019, 11:11 AM

## 2019-02-18 ENCOUNTER — Ambulatory Visit (INDEPENDENT_AMBULATORY_CARE_PROVIDER_SITE_OTHER): Payer: Medicare Other | Admitting: Pulmonary Disease

## 2019-02-18 ENCOUNTER — Other Ambulatory Visit: Payer: Self-pay

## 2019-02-18 ENCOUNTER — Encounter: Payer: Self-pay | Admitting: Pulmonary Disease

## 2019-02-18 VITALS — BP 110/70 | HR 64 | Temp 96.0°F | Ht 63.0 in | Wt 223.2 lb

## 2019-02-18 DIAGNOSIS — Z7189 Other specified counseling: Secondary | ICD-10-CM

## 2019-02-18 DIAGNOSIS — G4733 Obstructive sleep apnea (adult) (pediatric): Secondary | ICD-10-CM | POA: Diagnosis not present

## 2019-02-18 DIAGNOSIS — Z23 Encounter for immunization: Secondary | ICD-10-CM

## 2019-02-18 DIAGNOSIS — J4530 Mild persistent asthma, uncomplicated: Secondary | ICD-10-CM | POA: Diagnosis not present

## 2019-02-18 DIAGNOSIS — G4730 Sleep apnea, unspecified: Secondary | ICD-10-CM

## 2019-02-18 DIAGNOSIS — E669 Obesity, unspecified: Secondary | ICD-10-CM

## 2019-02-18 NOTE — Progress Notes (Signed)
[facility]  Chief Complaint  Patient presents with  . Follow-up    She reports she wants to discuss getting a cpap machine. She reports her breathing has been at her basline. She denies SOB or chest disomfort.     Constitutional:  BP 110/70   Pulse 64   Temp (!) 96 F (35.6 C) (Temporal)   Ht 5' 3"  (1.6 m)   Wt 223 lb 3.2 oz (101.2 kg)   SpO2 96%   BMI 39.54 kg/m   Past Medical History:  Heb B, HLD, Bipolar, Anxiety, Depression, CVA, GERD, Cirrhosis, Type 1 aortic dissection, DM, thrombocytopenia, RLS  Brief Summary:  Stephanie [name] is a 68 y.o. female former smoker with OSA, dyspnea with asthma and deconditioning.  She had repeat sleep study in November 2019.  Showed mild obstructive sleep apnea.  Couldn't get CPAP set up due to pandemic.  Had in lab titration study in August 2020 at [facility].  Results not available at this time.  She is still having trouble with her sleep.  Snoring, and feels tired during the day.  Not having trouble with her breathing at present.  Not having cough, wheeze, sputum, or chest congestion.  Has been using albuterol bid because she was told to do so.  She is living at [facility] assisted living facility and is accompanied by an aide today.  CXR from 11/20/18 was unremarkable (reviewed by me).  She has colonoscopy scheduled for later this month.   Physical Exam:   Appearance - well kempt   ENMT - clear nasal mucosa, midline nasal  septum, no oral exudates, no LAN, trachea midline  Respiratory - normal chest wall, normal respiratory effort, no accessory muscle use, no wheeze/rales  CV - s1s2 regular rate and rhythm, no murmurs, no peripheral edema, radial pulses symmetric  GI - soft, non tender, no masses  Lymph - no adenopathy noted in neck and axillary areas  MSK - normal gait  Ext - no cyanosis, clubbing, or joint inflammation noted  Skin - no rashes, lesions, or ulcers  Neuro -  normal strength, oriented x 3  Psych - normal mood and affect   Assessment/Plan:   Obstructive sleep apnea. - reviewed her sleep study with her - discussed how untreated sleep apnea can impact her health and quality of life - will resume auto CPAP   Mild, intermittent asthma. - albuterol prn  - high dose flu shot today  Preoperative pulmonary assessment. - she can proceed with scheduled colonoscopy - need to have close monitoring of her oxygenation during and after the procedure until she is fully recovered from sedation   Patient Instructions  Will arrange for new CPAP set up  You can use albuterol every 6 hours as needed when you have coughing, chest congestion, shortness of breath or wheezing; you don't need to use albuterol on a regular basis  Follow up in 2 months    Chesley Mires, MD [place] Pager: [phone] 02/18/2019, 12:21 PM  Flow Sheet     Pulmonary tests:  PFT 02/07/14 >> FEV1 1.94 (66%), FEV1% 91, TLC 3.62 (77%), DLCO 75%, +BD  Chest imaging:  CT chest 06/28/16 >> mild emphysema, coronary calcifications, cirrhosis with portal hypertension, splenomegaly CT chest 07/27/18 >> mild CM, atherosclerosis, patchy GGO, 3 mm nodule RUL resolved, cirrhosis of liver, splenomegaly  Sleep tests:  PSG 08/13/13 >> REM AHI 27.9, SaO2 low 82%, Spent 15.1 min with SpO2 < 90%.  CPAP 09/06/13 >> CPAP 8 >> AHI 0, + R, + S. PSG 10/18/13 >> AHI 6.9, SaO2 low 82%. Spent 139.2 min with SaO2 < 89%. ONO with CPAP and RA 11/11/13 >> Test time 8 hrs 59 min. Mean SpO2 92%, low SpO2 81%. Spent 9 min with SpO2 < 88% CPAP 06/11/16 to 07/10/16 >> used on 13 of 30 nights with average 4 hrs 50 min.  Average AHI 6.7 with CPAP 8 cm H2O PSG 04/27/18 >> AHI 10.2, SpO2 low 77%  Cardiac tests:  Echo 11/14/13 >> EF 55 to 60%   Medications:   Allergies as of 02/18/2019      Reactions   Cortisone    Increased BS   Doxycycline Hives, Swelling, Other (See Comments), Nausea  Only   Reaction:  Facial swelling  Face red and swollen; no difficulty breathing      Medication List       Accurate as of February 18, 2019 12:21 PM. If you have any questions, ask your nurse or doctor.        albuterol (2.5 MG/3ML) 0.083% nebulizer solution Commonly known as: PROVENTIL Take 2.5 mg by nebulization every 6 (six) hours as needed for wheezing or shortness of breath.   albuterol 108 (90 Base) MCG/ACT inhaler Commonly known as: VENTOLIN HFA Inhale 2 puffs into the lungs every 6 (six) hours as needed for wheezing or shortness of breath.   buPROPion 100 MG 12 hr tablet Commonly known as: WELLBUTRIN SR Take 1 tablet (100 mg total) by mouth daily after breakfast.   clonazePAM 0.25 MG disintegrating tablet Commonly known as: KLONOPIN Take 0.25 mg by mouth 2 (two) times daily.   diclofenac sodium 1 % Gel Commonly known as: VOLTAREN Apply topically 3 (three) times daily.   dicyclomine 10 MG capsule Commonly known as: BENTYL Take 10 mg by mouth 3 (three) times daily.   diphenhydrAMINE 25 mg capsule Commonly known as: BENADRYL Take 25 mg by mouth every 6 (six) hours as needed.   donepezil 10 MG tablet Commonly known as: ARICEPT Take 10 mg by mouth at bedtime.   ferrous sulfate 325 (65 FE) MG tablet Take 325 mg by mouth 2 (two) times daily.   furosemide 80 MG tablet Commonly known as: LASIX Take 40 mg by mouth daily.   gabapentin 300 MG capsule Commonly known as: NEURONTIN Take 1 capsule (300 mg total) by mouth 3 (three) times daily. Takes 300 mg at 8 am , 2pm, 300 mg 8 pm. What changed: additional instructions   Geri-kot 8.6 MG tablet Generic drug: senna Take 2 tablets by mouth daily.   HYDROcodone-acetaminophen 7.5-325 MG tablet Commonly known as: NORCO Take 1 tablet by mouth every 6 (six) hours as needed for moderate pain.   insulin glargine 100 UNIT/ML injection Commonly known as: LANTUS Inject 42 Units into the skin every evening.   insulin  lispro 100 UNIT/ML injection Commonly known as: HUMALOG Inject 8 Units into the skin 3 (three) times daily before meals.   lactulose 10 GM/15ML solution Commonly known as: CHRONULAC Take 10 g by mouth daily. (0800)   lamoTRIgine 150 MG tablet Commonly known as: LaMICtal Take 1 tablet (150 mg total) by mouth 2 (two) times daily.   lidocaine 5 % Commonly known as: LIDODERM Place 1 patch onto the skin daily.   liraglutide 18 MG/3ML Sopn Commonly known as: VICTOZA Inject 1.8 mg into the skin daily.   loperamide 2 MG capsule Commonly known as: IMODIUM Take 4 mg by  mouth as needed for diarrhea or loose stools.   metoCLOPramide 5 MG tablet Commonly known as: REGLAN Take 5 mg by mouth 3 (three) times daily before meals. (0800, 1200, 1600 & 2000)   nystatin powder Commonly known as: MYCOSTATIN/NYSTOP Apply topically 4 (four) times daily. What changed:   when to take this  additional instructions   omeprazole 20 MG capsule Commonly known as: PRILOSEC Take 20 mg by mouth daily.   ondansetron 4 MG tablet Commonly known as: ZOFRAN Take 4 mg by mouth 3 (three) times daily before meals.   PARoxetine 10 MG tablet Commonly known as: PAXIL Take 1 tablet (10 mg total) by mouth daily.   potassium chloride SA 20 MEQ tablet Commonly known as: K-DUR Take 20 mEq by mouth daily.   pregabalin 75 MG capsule Commonly known as: LYRICA   Restasis 0.05 % ophthalmic emulsion Generic drug: cycloSPORINE Place 1 drop into both eyes 2 (two) times daily.   rOPINIRole 3 MG tablet Commonly known as: REQUIP Take 3 mg by mouth at bedtime. (2000)   simvastatin 10 MG tablet Commonly known as: ZOCOR Take 10 mg by mouth at bedtime.       Past Surgical History:  She  has a past surgical history that includes Cholecystectomy; Rotator cuff repair; Abdominal hysterectomy; Lumbar fusion (10/09); Hemorroidectomy; Abdominal exploration surgery; Posterior cervical fusion/foraminotomy; Lumbar wound  debridement (05/09/2011); Axillary artery cannulation via 8-mm Hemashield graft, median sternotomy, extracorporeal circulation with deep hypothermic circulatory arrest, repair of  aortic dessection (01/23/2009); cataract; Colonoscopy; Liver biopsy (04/10/2012); Back surgery; Eye surgery (Bilateral); Knee arthroscopy with lateral release (Right, 11/15/2016); and Facial laceration repair (Left, 04/11/2018).  Family History:  Her family history includes Anxiety disorder in her brother; Breast cancer in her mother; Depression in her brother; Diabetes in an other family member; Lung cancer in her mother; Stomach cancer in her father.  Social History:  She  reports that she quit smoking about 5 years ago. Her smoking use included cigarettes. She has a 75.00 pack-year smoking history. She has never used smokeless tobacco. She reports that she does not drink alcohol or use drugs.

## 2019-02-18 NOTE — Patient Instructions (Signed)
Will arrange for new CPAP set up  You can use albuterol every 6 hours as needed when you have coughing, chest congestion, shortness of breath or wheezing; you don't need to use albuterol on a regular basis  Follow up in 2 months

## 2019-02-19 ENCOUNTER — Telehealth: Payer: Self-pay

## 2019-02-19 ENCOUNTER — Telehealth: Payer: Self-pay | Admitting: Pulmonary Disease

## 2019-02-19 NOTE — Telephone Encounter (Signed)
Faxed a release of records request for a copy of this patients sleep study. Received sleep study from 2019. Call made to sleep med [phone] or [phone], spoke with Sharyn Lull, she states she is going to fax over the most recent request from Aug 2020. Will await fax.

## 2019-02-19 NOTE — Telephone Encounter (Signed)
Called and spoke with patient. She states her insurance companies had told her she would qualify for a smaller machine. She has the number to adapt and will call to see if they can run it through her insurance before they give her a machine.   Nothing further needed at this time.

## 2019-02-19 NOTE — Telephone Encounter (Signed)
Pt calling a/b machine again needing smaller one instead of standard machine needing to speak to nurse before order is sent in  She can be reached @ [phone].Stephanie [name]

## 2019-02-22 ENCOUNTER — Other Ambulatory Visit: Admission: RE | Admit: 2019-02-22 | Payer: Medicare Other | Source: Ambulatory Visit

## 2019-02-26 ENCOUNTER — Other Ambulatory Visit: Payer: Self-pay

## 2019-02-26 ENCOUNTER — Encounter
Admission: RE | Disposition: A | Discharge status: Home / Self Care | Payer: Self-pay | Source: Home / Self Care | Attending: Gastroenterology

## 2019-02-26 ENCOUNTER — Ambulatory Visit: Payer: Medicare Other | Admitting: Anesthesiology

## 2019-02-26 ENCOUNTER — Encounter: Payer: Self-pay | Admitting: *Deleted

## 2019-02-26 ENCOUNTER — Ambulatory Visit
Admission: RE | Admit: 2019-02-26 | Discharge: 2019-02-26 | Disposition: A | Discharge status: Home / Self Care | Payer: Medicare Other | Attending: Gastroenterology | Admitting: Gastroenterology

## 2019-02-26 ENCOUNTER — Other Ambulatory Visit
Admission: RE | Admit: 2019-02-26 | Discharge: 2019-02-26 | Disposition: A | Discharge status: Home / Self Care | Payer: Medicare Other | Source: Ambulatory Visit | Attending: Gastroenterology | Admitting: Gastroenterology

## 2019-02-26 DIAGNOSIS — E785 Hyperlipidemia, unspecified: Secondary | ICD-10-CM | POA: Insufficient documentation

## 2019-02-26 DIAGNOSIS — F419 Anxiety disorder, unspecified: Secondary | ICD-10-CM | POA: Diagnosis not present

## 2019-02-26 DIAGNOSIS — D124 Benign neoplasm of descending colon: Secondary | ICD-10-CM | POA: Insufficient documentation

## 2019-02-26 DIAGNOSIS — R195 Other fecal abnormalities: Secondary | ICD-10-CM | POA: Insufficient documentation

## 2019-02-26 DIAGNOSIS — I110 Hypertensive heart disease with heart failure: Secondary | ICD-10-CM | POA: Diagnosis not present

## 2019-02-26 DIAGNOSIS — D500 Iron deficiency anemia secondary to blood loss (chronic): Secondary | ICD-10-CM

## 2019-02-26 DIAGNOSIS — F319 Bipolar disorder, unspecified: Secondary | ICD-10-CM | POA: Diagnosis not present

## 2019-02-26 DIAGNOSIS — K7460 Unspecified cirrhosis of liver: Secondary | ICD-10-CM | POA: Insufficient documentation

## 2019-02-26 DIAGNOSIS — Z7951 Long term (current) use of inhaled steroids: Secondary | ICD-10-CM | POA: Insufficient documentation

## 2019-02-26 DIAGNOSIS — M1990 Unspecified osteoarthritis, unspecified site: Secondary | ICD-10-CM | POA: Insufficient documentation

## 2019-02-26 DIAGNOSIS — D509 Iron deficiency anemia, unspecified: Secondary | ICD-10-CM | POA: Diagnosis not present

## 2019-02-26 DIAGNOSIS — K7581 Nonalcoholic steatohepatitis (NASH): Secondary | ICD-10-CM | POA: Insufficient documentation

## 2019-02-26 DIAGNOSIS — Z8673 Personal history of transient ischemic attack (TIA), and cerebral infarction without residual deficits: Secondary | ICD-10-CM | POA: Diagnosis not present

## 2019-02-26 DIAGNOSIS — I509 Heart failure, unspecified: Secondary | ICD-10-CM | POA: Diagnosis not present

## 2019-02-26 DIAGNOSIS — R197 Diarrhea, unspecified: Secondary | ICD-10-CM | POA: Insufficient documentation

## 2019-02-26 DIAGNOSIS — Z20828 Contact with and (suspected) exposure to other viral communicable diseases: Secondary | ICD-10-CM | POA: Insufficient documentation

## 2019-02-26 DIAGNOSIS — K2970 Gastritis, unspecified, without bleeding: Secondary | ICD-10-CM | POA: Diagnosis not present

## 2019-02-26 DIAGNOSIS — I739 Peripheral vascular disease, unspecified: Secondary | ICD-10-CM | POA: Diagnosis not present

## 2019-02-26 DIAGNOSIS — I38 Endocarditis, valve unspecified: Secondary | ICD-10-CM | POA: Diagnosis not present

## 2019-02-26 DIAGNOSIS — Z860100 Personal history of colon polyps, unspecified: Secondary | ICD-10-CM

## 2019-02-26 DIAGNOSIS — J449 Chronic obstructive pulmonary disease, unspecified: Secondary | ICD-10-CM | POA: Diagnosis not present

## 2019-02-26 DIAGNOSIS — Z79899 Other long term (current) drug therapy: Secondary | ICD-10-CM | POA: Insufficient documentation

## 2019-02-26 DIAGNOSIS — Z9049 Acquired absence of other specified parts of digestive tract: Secondary | ICD-10-CM | POA: Insufficient documentation

## 2019-02-26 DIAGNOSIS — E119 Type 2 diabetes mellitus without complications: Secondary | ICD-10-CM | POA: Diagnosis not present

## 2019-02-26 DIAGNOSIS — Z6838 Body mass index (BMI) 38.0-38.9, adult: Secondary | ICD-10-CM | POA: Insufficient documentation

## 2019-02-26 DIAGNOSIS — Z881 Allergy status to other antibiotic agents status: Secondary | ICD-10-CM | POA: Insufficient documentation

## 2019-02-26 DIAGNOSIS — G4700 Insomnia, unspecified: Secondary | ICD-10-CM | POA: Insufficient documentation

## 2019-02-26 DIAGNOSIS — Z888 Allergy status to other drugs, medicaments and biological substances status: Secondary | ICD-10-CM | POA: Insufficient documentation

## 2019-02-26 DIAGNOSIS — Z791 Long term (current) use of non-steroidal anti-inflammatories (NSAID): Secondary | ICD-10-CM | POA: Insufficient documentation

## 2019-02-26 DIAGNOSIS — D122 Benign neoplasm of ascending colon: Secondary | ICD-10-CM | POA: Insufficient documentation

## 2019-02-26 DIAGNOSIS — Z87891 Personal history of nicotine dependence: Secondary | ICD-10-CM | POA: Insufficient documentation

## 2019-02-26 DIAGNOSIS — K635 Polyp of colon: Secondary | ICD-10-CM

## 2019-02-26 DIAGNOSIS — K641 Second degree hemorrhoids: Secondary | ICD-10-CM | POA: Insufficient documentation

## 2019-02-26 DIAGNOSIS — G4730 Sleep apnea, unspecified: Secondary | ICD-10-CM | POA: Insufficient documentation

## 2019-02-26 DIAGNOSIS — K219 Gastro-esophageal reflux disease without esophagitis: Secondary | ICD-10-CM | POA: Insufficient documentation

## 2019-02-26 DIAGNOSIS — Z794 Long term (current) use of insulin: Secondary | ICD-10-CM | POA: Insufficient documentation

## 2019-02-26 DIAGNOSIS — D696 Thrombocytopenia, unspecified: Secondary | ICD-10-CM | POA: Insufficient documentation

## 2019-02-26 DIAGNOSIS — E669 Obesity, unspecified: Secondary | ICD-10-CM | POA: Insufficient documentation

## 2019-02-26 HISTORY — PX: ESOPHAGOGASTRODUODENOSCOPY (EGD) WITH PROPOFOL: SHX5813

## 2019-02-26 HISTORY — PX: COLONOSCOPY WITH PROPOFOL: SHX5780

## 2019-02-26 LAB — GLUCOSE, CAPILLARY: Glucose-Capillary: 111 mg/dL — ABNORMAL HIGH (ref 70–99)

## 2019-02-26 LAB — SARS CORONAVIRUS 2 BY RT PCR (HOSPITAL ORDER, PERFORMED IN ~~LOC~~ HOSPITAL LAB): SARS Coronavirus 2: NEGATIVE

## 2019-02-26 IMAGING — CR DG KNEE COMPLETE 4+V*R*
4 series · 4 of 4 positions shown · non-contrast
Comparison: 09/13/2017

CLINICAL DATA: Patient fell over her walker 2 days ago and has
right knee pain.

EXAM:
RIGHT KNEE - COMPLETE 4+ VIEW

[knee ap]
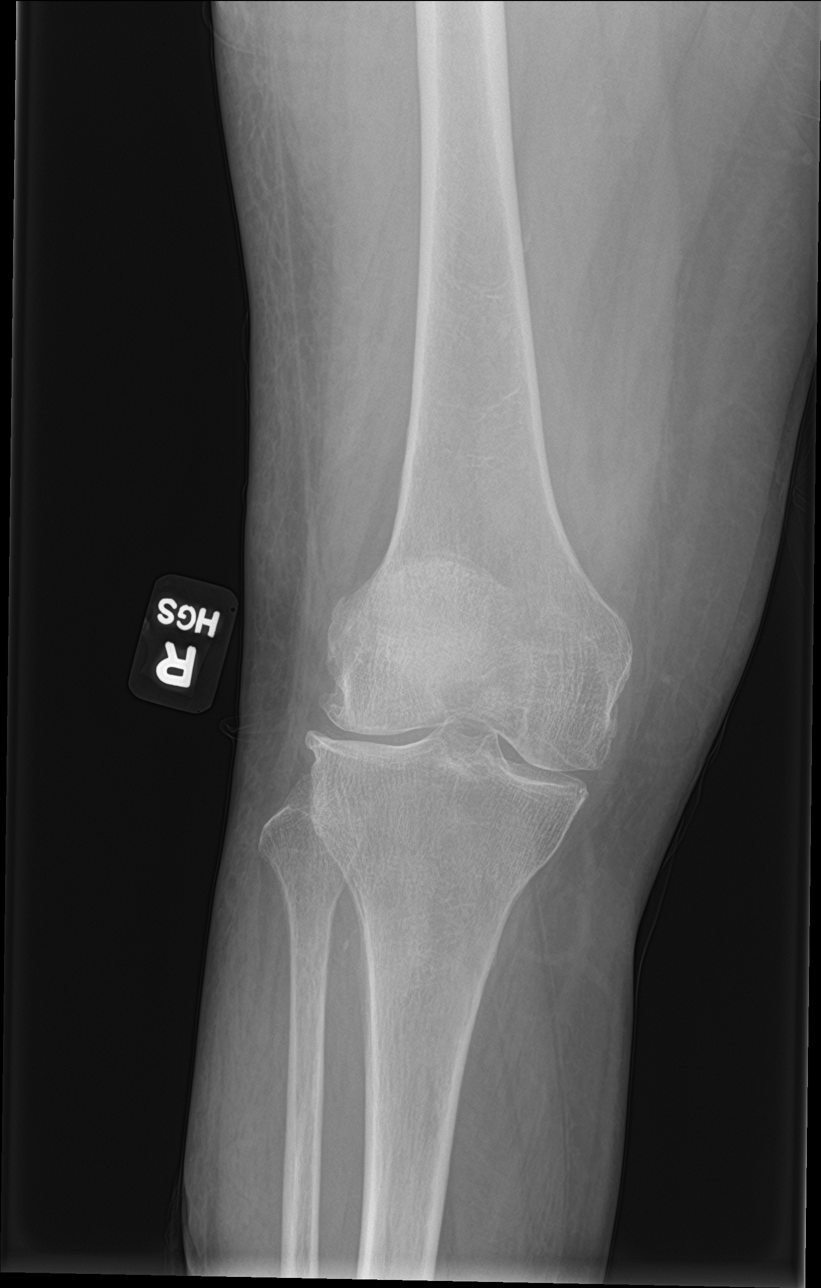

[knee obl (1 of 2)]
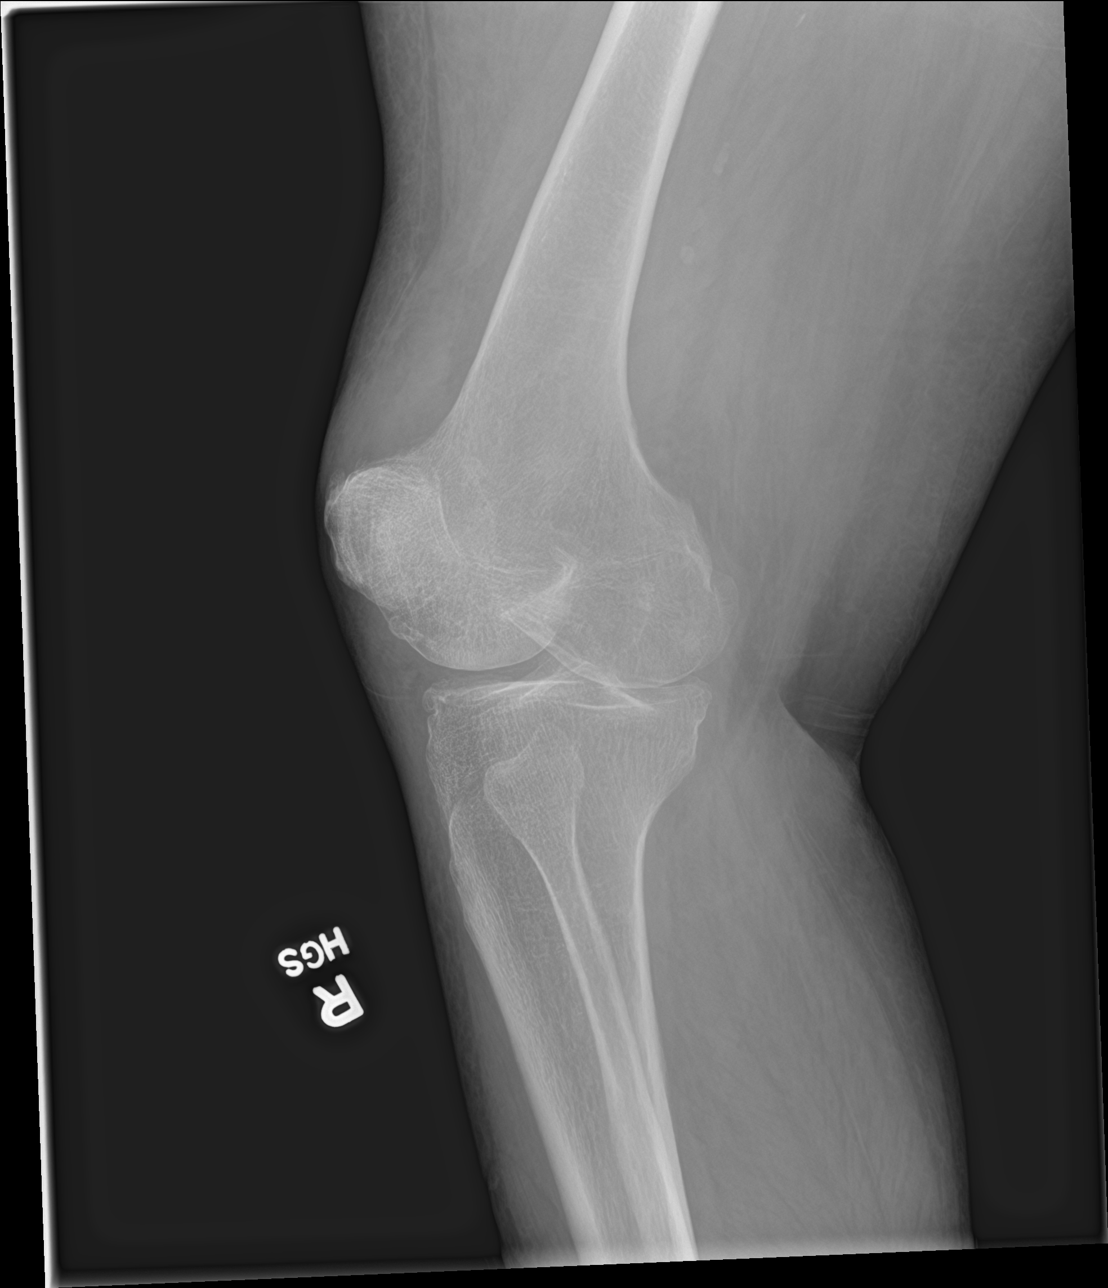

[knee obl (2 of 2)]
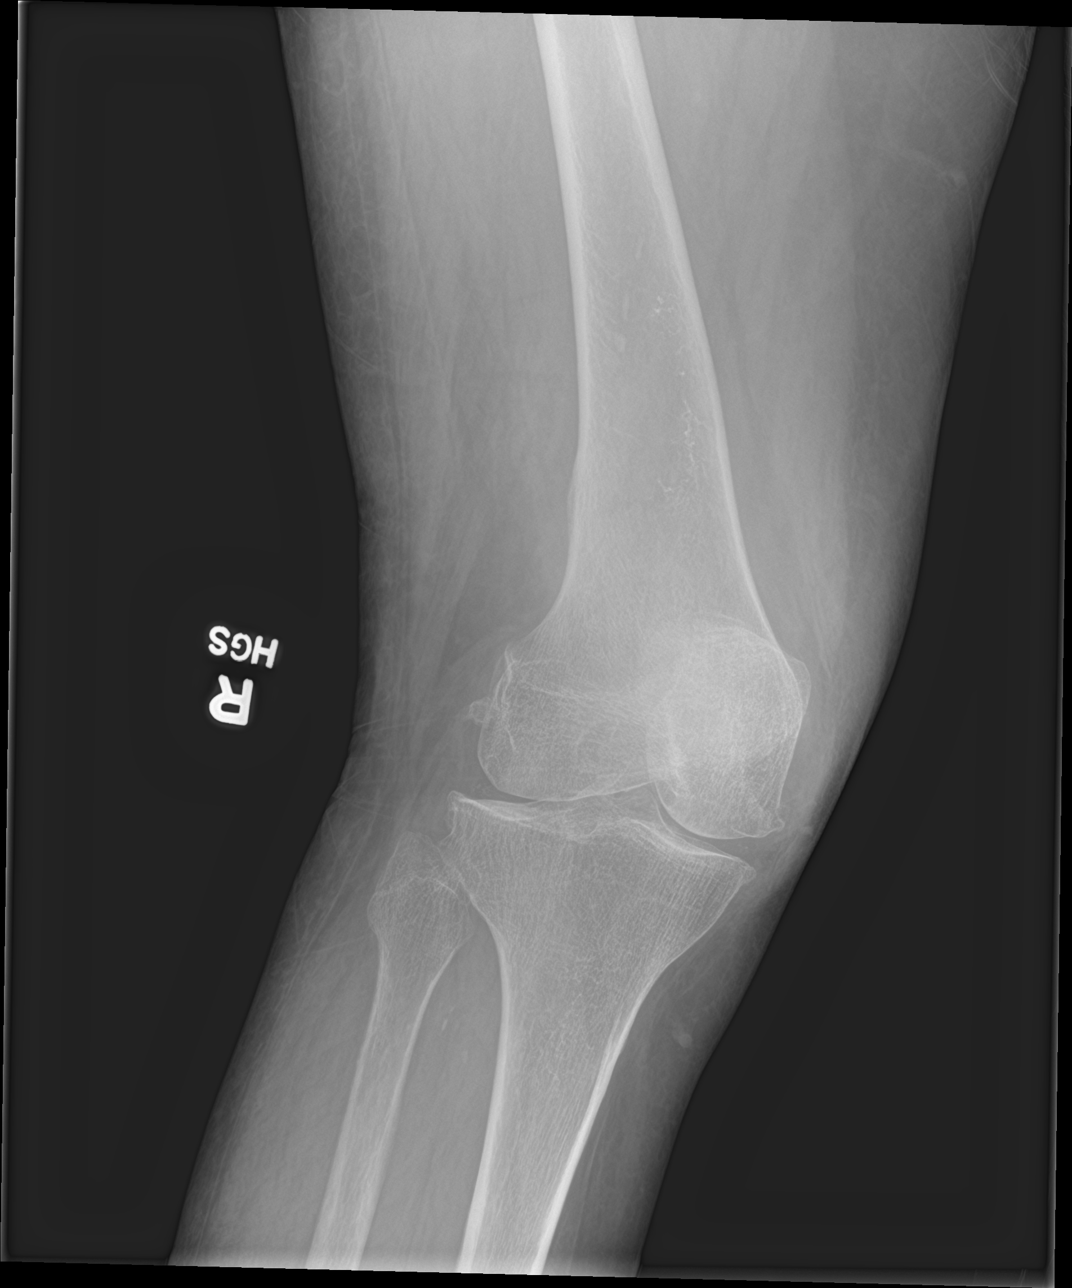

[knee lat]
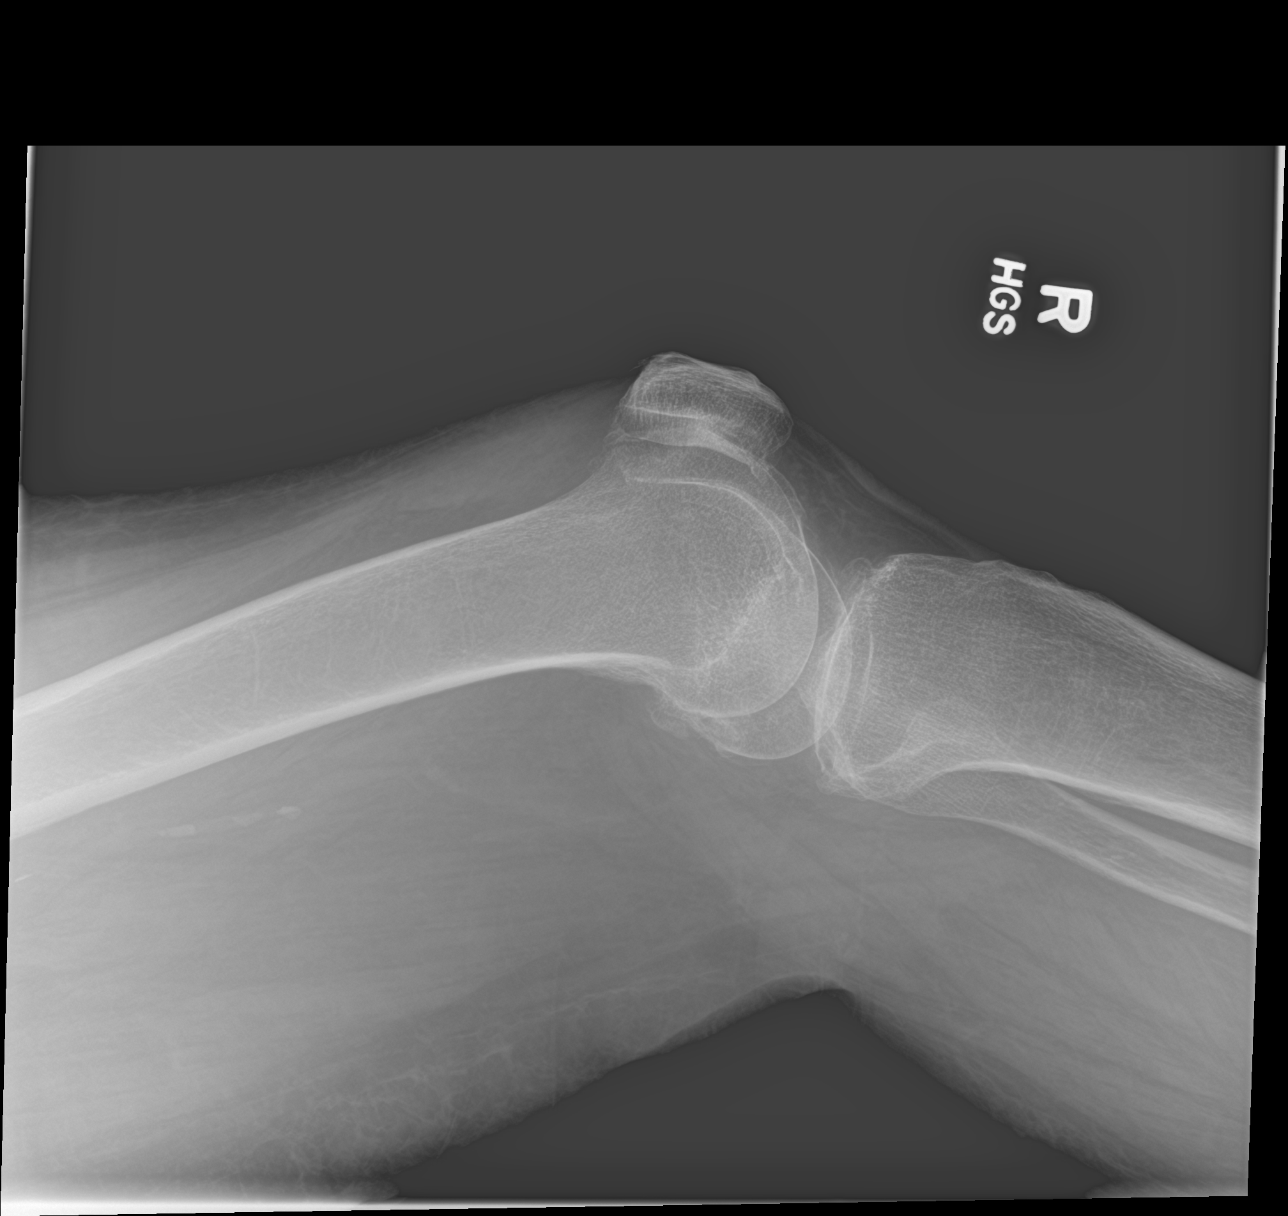

[4 of 4 positions shown; findings below may reference images not displayed]

FINDINGS: Mild tricompartmental osteoarthritis of the knee with joint space
narrowing and spurring is identified of the femorotibial and
patellofemoral compartments. No acute fracture or joint dislocation.
No joint effusion. Scattered femoral through tibial arteriosclerosis
is identified.
IMPRESSION: Tricompartmental osteoarthritis of the knee without acute osseous
abnormality.

## 2019-02-26 SURGERY — COLONOSCOPY WITH PROPOFOL
Anesthesia: General

## 2019-02-26 MED ORDER — FENTANYL CITRATE (PF) 100 MCG/2ML IJ SOLN
INTRAMUSCULAR | Status: AC
  Filled 2019-02-26: qty 2

## 2019-02-26 MED ORDER — PROPOFOL 10 MG/ML IV BOLUS
INTRAVENOUS | Status: DC | PRN
  Administered 2019-02-26: 20 mg via INTRAVENOUS
  Administered 2019-02-26: 10 mg via INTRAVENOUS
  Administered 2019-02-26: 40 mg via INTRAVENOUS

## 2019-02-26 MED ORDER — SODIUM CHLORIDE 0.9 % IV SOLN
INTRAVENOUS | Status: DC
  Administered 2019-02-26: 1000 mL via INTRAVENOUS

## 2019-02-26 MED ORDER — FENTANYL CITRATE (PF) 100 MCG/2ML IJ SOLN
INTRAMUSCULAR | Status: DC | PRN
  Administered 2019-02-26: 50 ug via INTRAVENOUS

## 2019-02-26 MED ORDER — LIDOCAINE HCL (PF) 2 % IJ SOLN
INTRAMUSCULAR | Status: DC | PRN
  Administered 2019-02-26: 100 mg via INTRADERMAL

## 2019-02-26 MED ORDER — PROPOFOL 500 MG/50ML IV EMUL
INTRAVENOUS | Status: DC | PRN
  Administered 2019-02-26: 140 ug/kg/min via INTRAVENOUS

## 2019-02-26 NOTE — Op Note (Signed)
[facility] Gastroenterology Patient Name: Stephanie [name] Procedure Date: 02/26/2019 11:59 AM MRN: [id number] Account #: [id number] Date of Birth: [birth year] Admit Type: Outpatient Age: 68 Room: [facility] ENDO ROOM 4 Gender: Female Note Status: Finalized Procedure:            Colonoscopy Indications:          High risk colon cancer surveillance: Personal history                        of colonic polyps 5 yuears ago Providers:            Lucilla Lame MD, MD Referring MD:         Arville Care Medicines:            Propofol per Anesthesia Complications:        No immediate complications. Procedure:            Pre-Anesthesia Assessment:                       - Prior to the procedure, a History and Physical was                        performed, and patient medications and allergies were                        reviewed. The patient's tolerance of previous                        anesthesia was also reviewed. The risks and benefits of                        the procedure and the sedation options and risks were                        discussed with the patient. All questions were                        answered, and informed consent was obtained. Prior                        Anticoagulants: The patient has taken no previous                        anticoagulant or antiplatelet agents. ASA Grade                        Assessment: II - A patient with mild systemic disease.                        After reviewing the risks and benefits, the patient was                        deemed in satisfactory condition to undergo the                        procedure.                       After obtaining informed consent, the colonoscope was  passed under direct vision. Throughout the procedure,                        the patient's blood pressure, pulse, and oxygen                        saturations were monitored continuously. The                        Colonoscope was  introduced through the anus and                        advanced to the the cecum, identified by appendiceal                        orifice and ileocecal valve. The colonoscopy was                        performed without difficulty. The patient tolerated the                        procedure well. The quality of the bowel preparation                        was excellent. Findings:      The perianal and digital rectal examinations were normal.      Two sessile polyps were found in the ascending colon. The polyps were 4       to 7 mm in size. These polyps were removed with a cold snare. Resection       and retrieval were complete. To prevent bleeding post-intervention, one       hemostatic clip was successfully placed (MR conditional). There was no       bleeding at the end of the procedure.      Two sessile polyps were found in the descending colon. The polyps were 3       to 4 mm in size. These polyps were removed with a cold biopsy forceps.       Resection and retrieval were complete.      Non-bleeding internal hemorrhoids were found during retroflexion. The       hemorrhoids were Grade II (internal hemorrhoids that prolapse but reduce       spontaneously). Impression:           - Two 4 to 7 mm polyps in the ascending colon, removed                        with a cold snare. Resected and retrieved. Clip (MR                        conditional) was placed.                       - Two 3 to 4 mm polyps in the descending colon, removed                        with a cold biopsy forceps. Resected and retrieved.                       - Non-bleeding internal hemorrhoids. Recommendation:       -  Discharge patient to home.                       - Resume previous diet.                       - Continue present medications.                       - Await pathology results.                       - Repeat colonoscopy in 5 years for surveillance. Procedure Code(s):    --- Professional ---                        [phone], Colonoscopy, flexible; with removal of tumor(s),                        polyp(s), or other lesion(s) by snare technique                       [id number], 102, Colonoscopy, flexible; with biopsy, single                        or multiple Diagnosis Code(s):    --- Professional ---                       Z86.010, Personal history of colonic polyps                       K63.5, Polyp of colon CPT copyright 2019 American Medical Association. All rights reserved. The codes documented in this report are preliminary and upon coder review may  be revised to meet current compliance requirements. Lucilla Lame MD, MD 02/26/2019 12:30:50 PM This report has been signed electronically. Number of Addenda: 0 Note Initiated On: 02/26/2019 11:59 AM Scope Withdrawal Time: 0 hours 9 minutes 47 seconds  Total Procedure Duration: 0 hours 14 minutes 8 seconds  Estimated Blood Loss: Estimated blood loss: none.      [facility]

## 2019-02-26 NOTE — Op Note (Signed)
[facility] Gastroenterology Patient Name: Stephanie [name] Procedure Date: 02/26/2019 12:01 PM MRN: [id number] Account #: [id number] Date of Birth: 03-Feb-1951 Admit Type: Outpatient Age: 68 Room: [facility] ENDO ROOM 4 Gender: Female Note Status: Finalized Procedure:            Upper GI endoscopy Indications:          Cirrhosis rule out esophageal varices Providers:            Lucilla Lame MD, MD Referring MD:         Arville Care Medicines:            Propofol per Anesthesia Complications:        No immediate complications. Procedure:            Pre-Anesthesia Assessment:                       - Prior to the procedure, a History and Physical was                        performed, and patient medications and allergies were                        reviewed. The patient's tolerance of previous                        anesthesia was also reviewed. The risks and benefits of                        the procedure and the sedation options and risks were                        discussed with the patient. All questions were                        answered, and informed consent was obtained. Prior                        Anticoagulants: The patient has taken no previous                        anticoagulant or antiplatelet agents. ASA Grade                        Assessment: II - A patient with mild systemic disease.                        After reviewing the risks and benefits, the patient was                        deemed in satisfactory condition to undergo the                        procedure.                       After obtaining informed consent, the endoscope was                        passed under direct vision. Throughout the procedure,  the patient's blood pressure, pulse, and oxygen                        saturations were monitored continuously. The Endoscope                        was introduced through the mouth, and advanced to the        second part of duodenum. The upper GI endoscopy was                        accomplished without difficulty. The patient tolerated                        the procedure well. Findings:      The examined esophagus was normal.      Diffuse moderate inflammation characterized by erythema was found in the       entire examined stomach. Biopsies were taken with a cold forceps for       histology.      The examined duodenum was normal. Impression:           - Normal esophagus.                       - Gastritis. Biopsied.                       - Normal examined duodenum. Recommendation:       - Discharge patient to home.                       - Resume previous diet.                       - Continue present medications.                       - Await pathology results.                       - Repeat upper endoscopy in 2 years for surveillance. Procedure Code(s):    --- Professional ---                       [phone], Esophagogastroduodenoscopy, flexible, transoral;                        with biopsy, single or multiple Diagnosis Code(s):    --- Professional ---                       K74.60, Unspecified cirrhosis of liver                       K29.70, Gastritis, unspecified, without bleeding CPT copyright 2019 American Medical Association. All rights reserved. The codes documented in this report are preliminary and upon coder review may  be revised to meet current compliance requirements. Lucilla Lame MD, MD 02/26/2019 12:13:05 PM This report has been signed electronically. Number of Addenda: 0 Note Initiated On: 02/26/2019 12:01 PM Estimated Blood Loss: Estimated blood loss: none.      [facility]

## 2019-02-26 NOTE — Anesthesia Post-op Follow-up Note (Signed)
Anesthesia QCDR form completed.        

## 2019-02-26 NOTE — Anesthesia Postprocedure Evaluation (Signed)
Anesthesia Post Note  Patient: LILLYANNE BRADBURN  Procedure(s) Performed: COLONOSCOPY WITH PROPOFOL (N/A ) ESOPHAGOGASTRODUODENOSCOPY (EGD) WITH PROPOFOL  Patient location during evaluation: Endoscopy Anesthesia Type: General Level of consciousness: awake and alert and oriented Pain management: pain level controlled Vital Signs Assessment: post-procedure vital signs reviewed and stable Respiratory status: spontaneous breathing, nonlabored ventilation and respiratory function stable Cardiovascular status: blood pressure returned to baseline and stable Postop Assessment: no signs of nausea or vomiting Anesthetic complications: no     Last Vitals:  Vitals:   02/26/19 1235 02/26/19 1245  BP: 105/62 129/66  Pulse: 82 84  Resp: 18 16  Temp: (!) 36.2 C   SpO2: 95% 95%    Last Pain:  Vitals:   02/26/19 1245  TempSrc:   PainSc: 0-No pain                 Evita Merida

## 2019-02-26 NOTE — Anesthesia Procedure Notes (Signed)
Performed by: Nelda Marseille, CRNA Pre-anesthesia Checklist: Patient identified, Emergency Drugs available, Suction available, Patient being monitored and Timeout performed Oxygen Delivery Method: Nasal cannula

## 2019-02-26 NOTE — Transfer of Care (Signed)
Immediate Anesthesia Transfer of Care Note  Patient: Stephanie [name]  Procedure(s) Performed: COLONOSCOPY WITH PROPOFOL (N/A ) ESOPHAGOGASTRODUODENOSCOPY (EGD) WITH PROPOFOL  Patient Location: PACU  Anesthesia Type:General  Level of Consciousness: awake, alert  and oriented  Airway & Oxygen Therapy: Patient Spontanous Breathing and Patient connected to nasal cannula oxygen  Post-op Assessment: Report given to RN and Post -op Vital signs reviewed and stable  Post vital signs: Reviewed and stable  Last Vitals:  Vitals Value Taken Time  BP    Temp    Pulse    Resp    SpO2      Last Pain:  Vitals:   02/26/19 1235  TempSrc: (P) Tympanic  PainSc:          Complications: No apparent anesthesia complications

## 2019-02-26 NOTE — Anesthesia Preprocedure Evaluation (Signed)
Anesthesia Evaluation  Patient identified by MRN, date of birth, ID band Patient awake    Reviewed: Allergy & Precautions, NPO status , Patient's Chart, lab work & pertinent test results  History of Anesthesia Complications Negative for: history of anesthetic complications  Airway Mallampati: II  TM Distance: >3 FB Neck ROM: Full    Dental  (+) Upper Dentures, Lower Dentures   Pulmonary asthma , sleep apnea , COPD,  COPD inhaler, former smoker,    breath sounds clear to auscultation- rhonchi (-) wheezing      Cardiovascular hypertension, + Peripheral Vascular Disease and +CHF  (-) CAD, (-) Past MI, (-) Cardiac Stents and (-) CABG  Rhythm:Regular Rate:Normal - Systolic murmurs and - Diastolic murmurs Echo 2/69/48: NORMAL LEFT VENTRICULAR SYSTOLIC FUNCTION  WITH MILD LVH NORMAL RIGHT VENTRICULAR SYSTOLIC FUNCTION MILD VALVULAR REGURGITATION (See above) NO VALVULAR STENOSIS MILD MR, TR EF >55%   Neuro/Psych PSYCHIATRIC DISORDERS Anxiety Depression Bipolar Disorder CVA, No Residual Symptoms    GI/Hepatic GERD  ,(+) Hepatitis - ([facility])  Endo/Other  diabetes, Insulin Dependent  Renal/GU negative Renal ROS     Musculoskeletal  (+) Arthritis ,   Abdominal (+) + obese,   Peds  Hematology  (+) anemia ,   Anesthesia Other Findings Past Medical History: 08/27/2014: Adenomatous colon polyp     Comment:  Polyps x 3 No date: Anemia No date: Anxiety No date: Aortic dissection (HCC)     Comment:  Type 1 No date: Arthritis No date: Asthma No date: Bipolar affective disorder (HCC)     Comment:  h/o 2000: Blood transfusion No date: Blood transfusion without reported diagnosis No date: Cancer ([place]) No date: Chronic abdominal pain     Comment:  abdominal wall pain No date: 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 No date: Colon polyp No date: COPD (chronic obstructive pulmonary disease) ([place]) No date: Depression No  date: Diabetes mellitus without complication ([place] 54/6/27: Fatty liver     Comment:  found in abd CT No date: GERD (gastroesophageal reflux disease) No date: H/O: CVA (cardiovascular accident)     Comment:  TIA No date: H/O: rheumatic fever No date: Hepatitis     Comment:  HEP "B" twenty years ago No date: Hyperlipidemia No date: Hypertension No date: IBS (irritable bowel syndrome) No date: Incontinence No date: Insomnia No date: Lower extremity edema No date: Neuropathy     Comment:  lower legs D/T DM No date: Obstructive sleep apnea     Comment:  Use C-PAP twice per week No date: Platelets decreased (HCC) No date: RLS (restless legs syndrome) No date: Shortness of breath No date: Sleep apnea No date: Stroke [facility])     Comment:  TIA- 2002 No date: Ulcer No date: Unspecified disorders of nervous system No date: Wears dentures     Comment:  full upper and lower   Reproductive/Obstetrics                             Anesthesia Physical Anesthesia Plan  ASA: III  Anesthesia Plan: General   Post-op Pain Management:    Induction: Intravenous  PONV Risk Score and Plan: 2 and Propofol infusion  Airway Management Planned: Natural Airway  Additional Equipment:   Intra-op Plan:   Post-operative Plan:   Informed Consent: I have reviewed the patients History and Physical, chart, labs and discussed the procedure including the risks, benefits and alternatives for the proposed  anesthesia with the patient or authorized representative who has indicated his/her understanding and acceptance.     Dental advisory given  Plan Discussed with: CRNA and Anesthesiologist  Anesthesia Plan Comments:         Anesthesia Quick Evaluation

## 2019-02-26 NOTE — Interval H&P Note (Signed)
History and Physical Interval Note:  02/26/2019 10:56 AM  Stephanie [name]  has presented today for surgery, with the diagnosis of Iron deficiency anemia D50.0.  The various methods of treatment have been discussed with the patient and family. After consideration of risks, benefits and other options for treatment, the patient has consented to  Procedure(s): COLONOSCOPY WITH PROPOFOL (N/A) as a surgical intervention.  The patient's history has been reviewed, patient examined, no change in status, stable for surgery.  I have reviewed the patient's chart and labs.  Questions were answered to the patient's satisfaction.     Tonesha Tsou [employer]

## 2019-02-27 ENCOUNTER — Encounter: Payer: Self-pay | Admitting: Gastroenterology

## 2019-02-28 LAB — SURGICAL PATHOLOGY

## 2019-03-04 ENCOUNTER — Telehealth: Payer: Self-pay | Admitting: Gastroenterology

## 2019-03-04 NOTE — Telephone Encounter (Signed)
Patient called & l/m on v/m she has upper & lower last Tuesday  would like results.

## 2019-03-05 NOTE — Telephone Encounter (Signed)
Pt informed results are still pending and we will contact her when results are available.

## 2019-03-06 NOTE — Telephone Encounter (Signed)
This has been addressed. Nothing further needed at this time.

## 2019-03-12 ENCOUNTER — Telehealth: Payer: Self-pay | Admitting: Gastroenterology

## 2019-03-12 NOTE — Telephone Encounter (Signed)
Patient called asking for her results for upper & lower endoscopy done by Dr Allen Norris. Please notify patient.

## 2019-03-15 ENCOUNTER — Encounter: Payer: Self-pay | Admitting: Gastroenterology

## 2019-03-17 IMAGING — CT CT CHEST W/O CM
1 of 2 series · 15 of 31 positions shown, 19 images · non-contrast
Comparison: Chest CT's 08/15/2017 and 07/07/2017

CLINICAL DATA: Followup pulmonary nodule.  Shortness of breath.

EXAM:
CT CHEST WITHOUT CONTRAST
TECHNIQUE: Multidetector CT imaging of the chest was performed following the
standard protocol without IV contrast.

[Series 2: chest w/(date) · axial · 0.97mm/px · z∈[-271,-31]mm · 15 of 136 slices shown, 19 images]
[im 8/136  mediastinal]
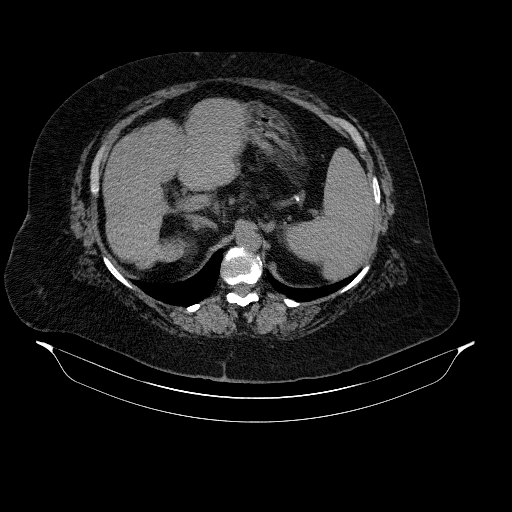
[im 8/136  lung]
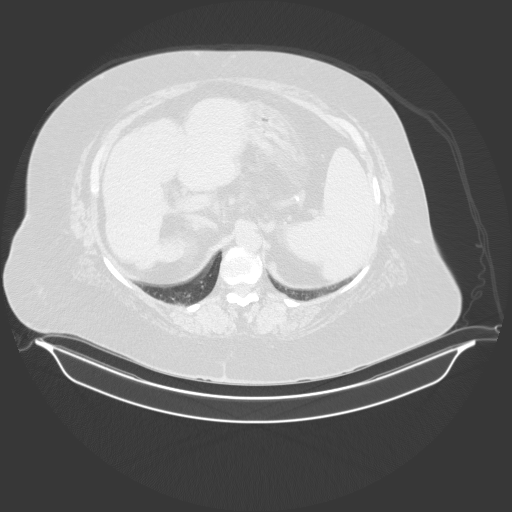
[im 22/136  lung]
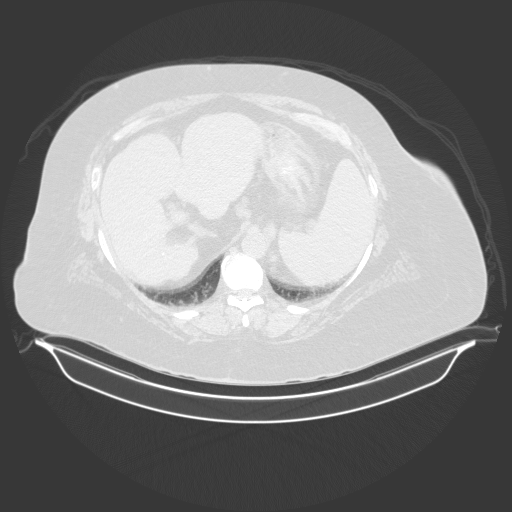
[im 29/136  lung]
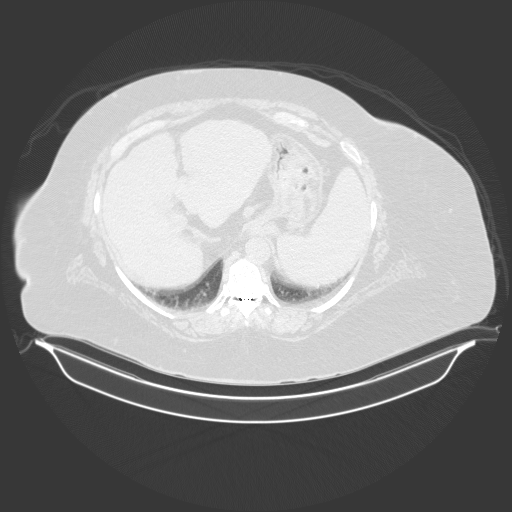
[im 36/136  lung]
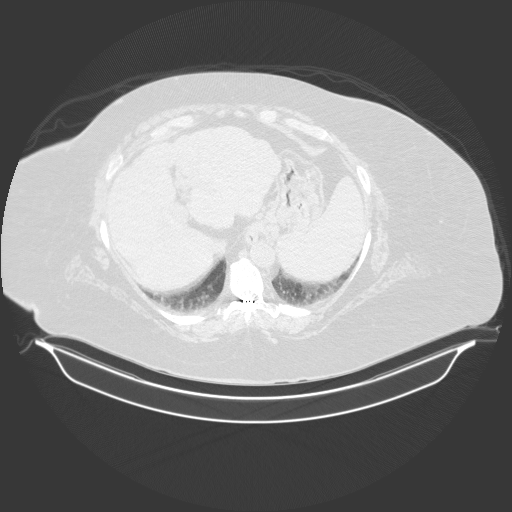
[im 46/136  mediastinal]
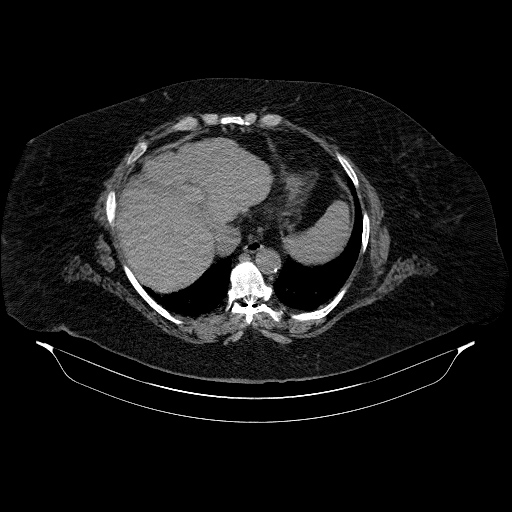
[im 46/136  lung]
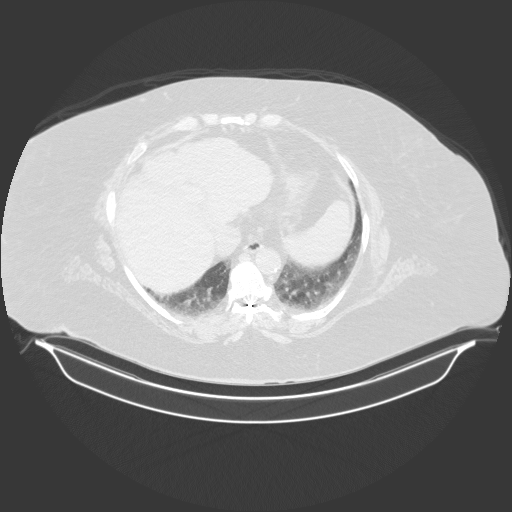
[im 50/136  lung]
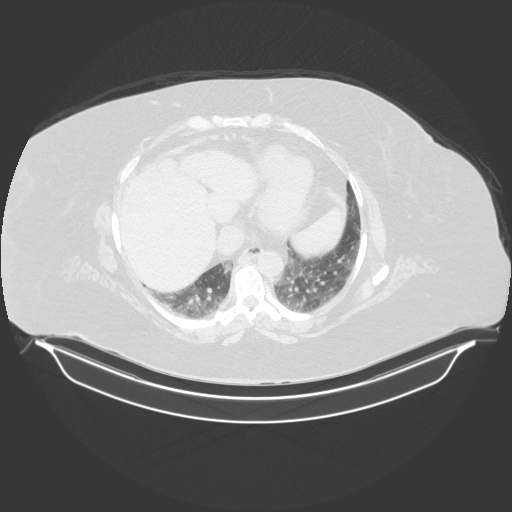
[im 57/136  lung]
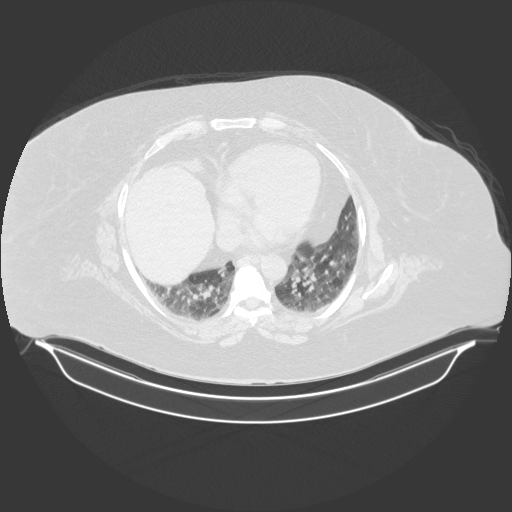
[im 64/136  lung]
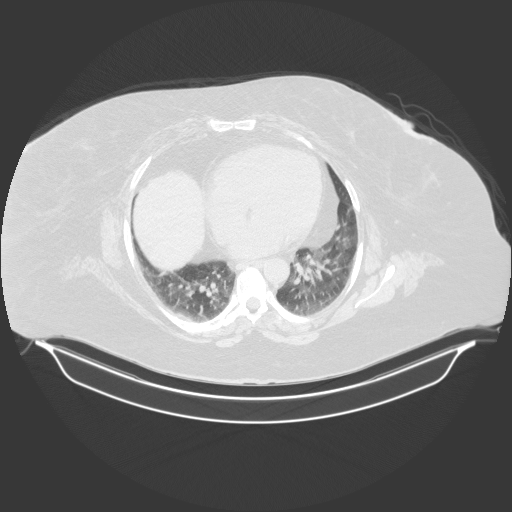
[im 72/136  mediastinal]
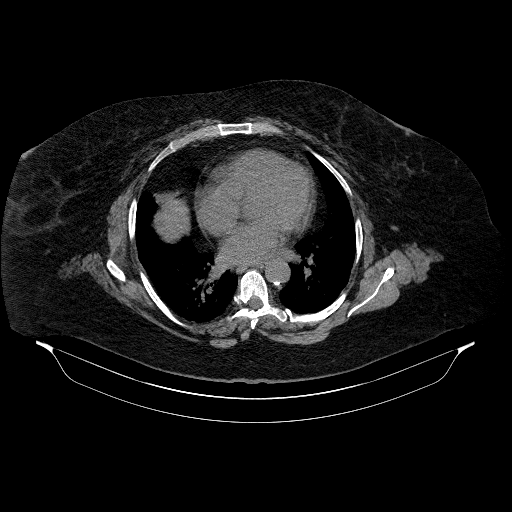
[im 72/136  lung]
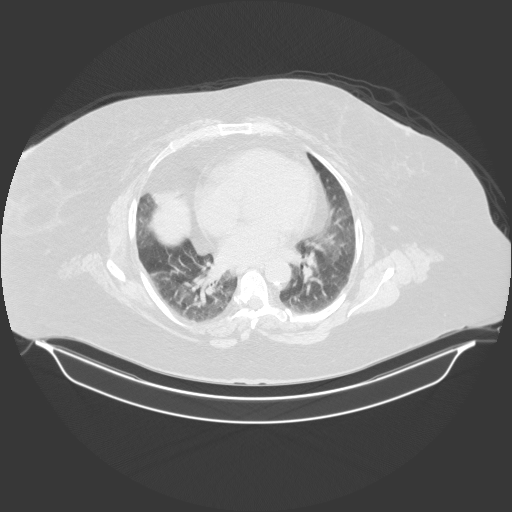
[im 86/136  lung]
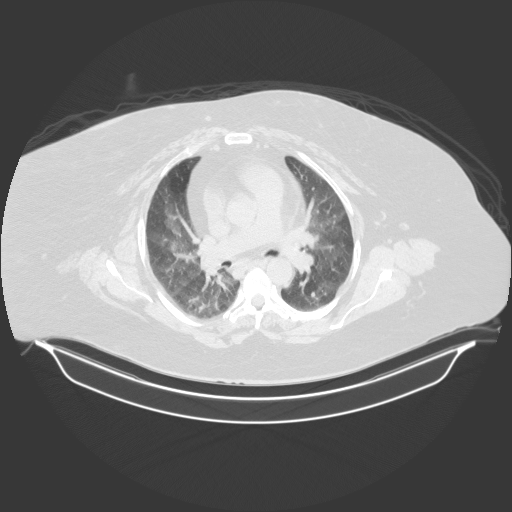
[im 91/136  lung]
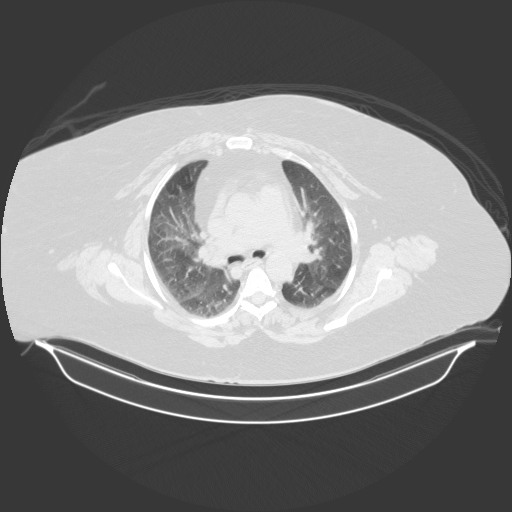
[im 100/136  lung]
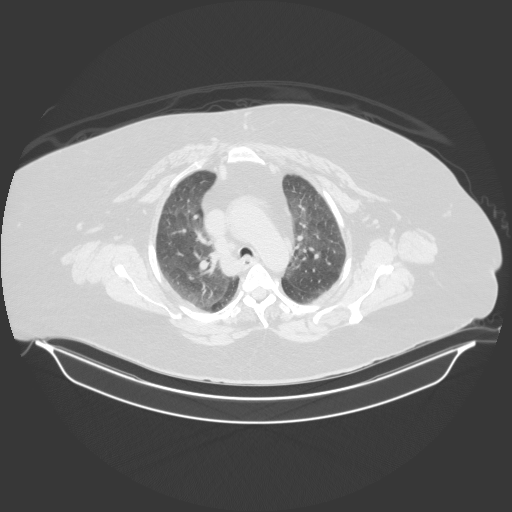
[im 107/136  mediastinal]
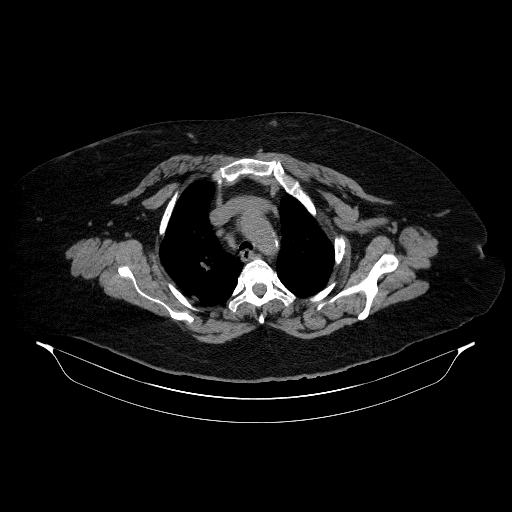
[im 107/136  lung]
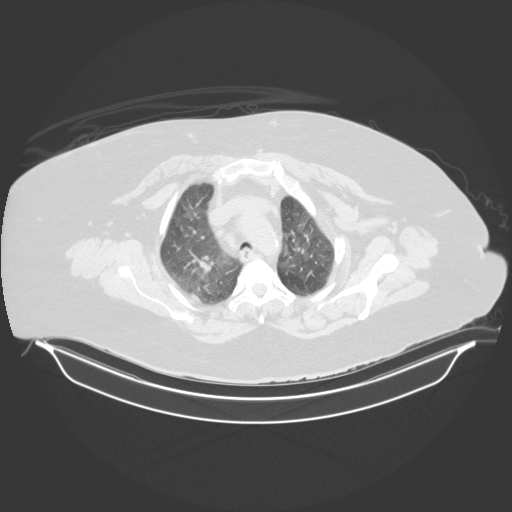
[im 114/136  lung]
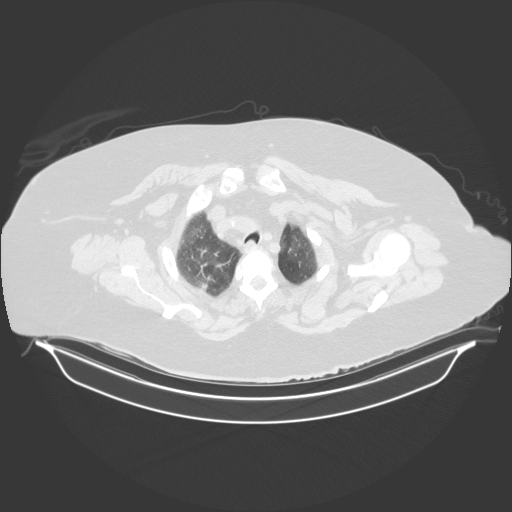
[im 128/136  lung]
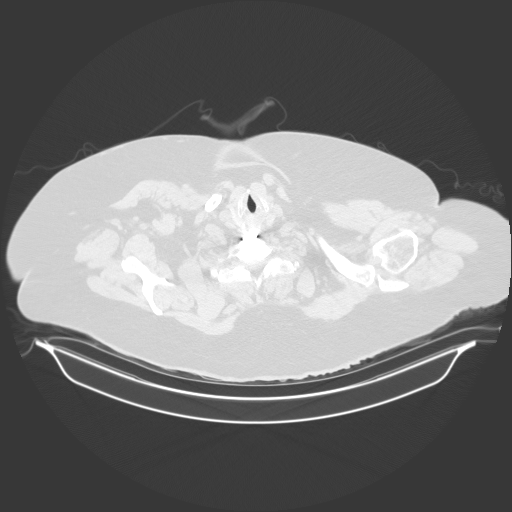

[15 of 31 positions shown; findings below may reference images not displayed]

FINDINGS: Cardiovascular: The heart is mildly enlarged but stable. Mild
tortuosity and ectasia of the thoracic aorta with scattered
calcifications. Stable three-vessel coronary artery calcifications.

Mediastinum/Nodes: Small scattered mediastinal and hilar lymph nodes
are stable. No new or progressive findings. The esophagus is grossly
normal.

Lungs/Pleura: Hazy patchy ground-glass opacity suggesting a partial
airspace filling process such as edema, interstitial pneumonitis,
respiratory bronchiolitis or hypersensitivity pneumonitis. No
worrisome pulmonary lesions. No pleural effusions. Dependent
bibasilar atelectasis.

The 3 mm nodule noted in the right upper lobe on the prior study
from July 2017 is no longer visualized and in retrospect I think
this was just a vessel.

Upper Abdomen: Advanced cirrhotic changes involving the liver
without focal hepatic lesion. Associated splenomegaly. Upper
abdominal lymph nodes, typical with cirrhosis.

Musculoskeletal: No significant bony findings. A spinal cord
stimulator is noted along with cervical spine fusion hardware.
IMPRESSION: 1. Hazy patchy ground-glass opacity in both lungs, likely a partial
airspace filling process or interstitial process as detailed above.
No focal pneumonia. Pulmonary edema is certainly possible but no
pleural effusions.
2. No worrisome pulmonary lesions or nodules.
3. Advanced cirrhotic changes involving the liver without focal
lesion. Associated splenomegaly.

Aortic Atherosclerosis ([id number]).

## 2019-03-21 IMAGING — US US ABDOMEN COMPLETE
1 series · 14 of 25 positions shown · non-contrast
Comparison: CT scan of April 23, 2017.

CLINICAL DATA: Hepatic cirrhosis.

EXAM:
ABDOMEN ULTRASOUND COMPLETE

[Series 1: us abdomen complete · 0.25mm/px · 14 of 77 slices shown]
[im 1/77]
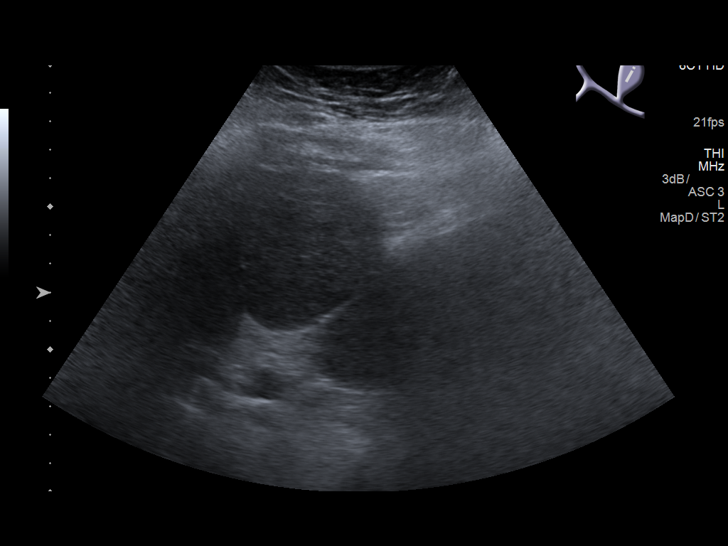
[im 7/77]
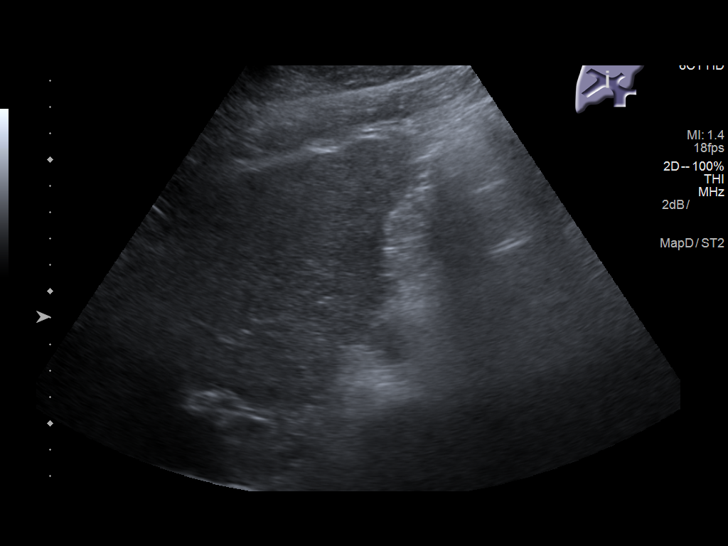
[im 13/77]
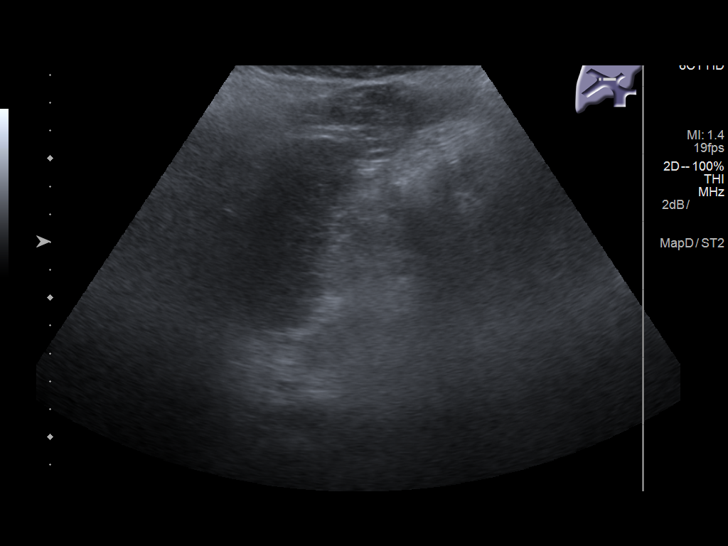
[im 20/77]
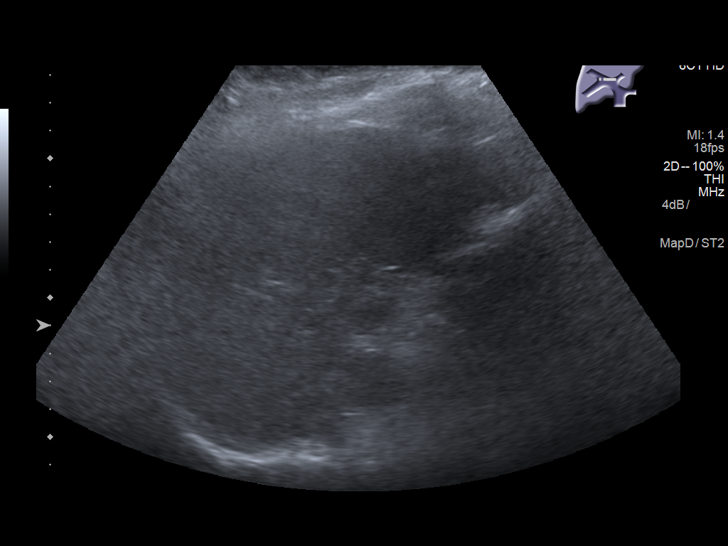
[im 26/77]
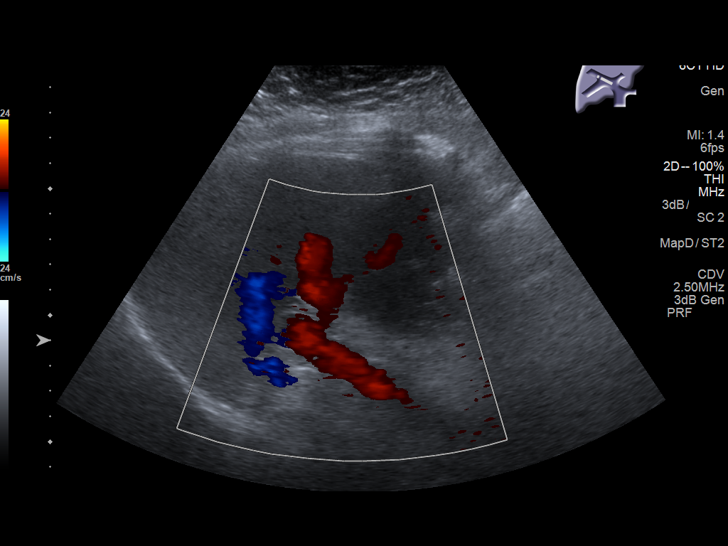
[im 29/77]
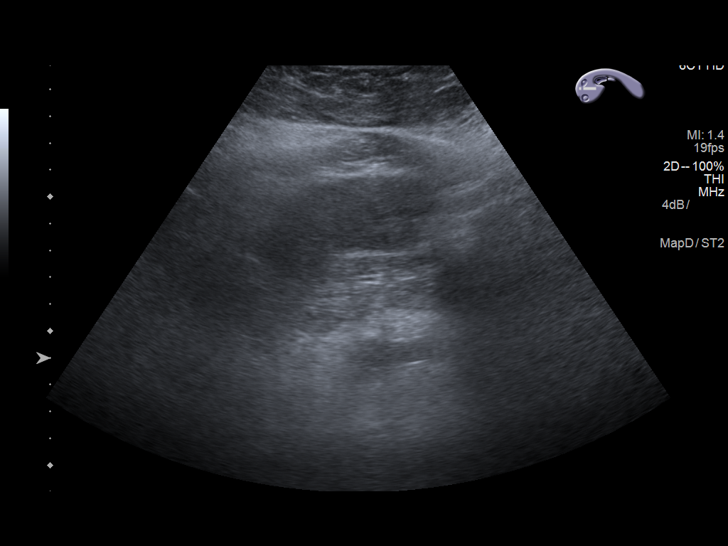
[im 35/77]
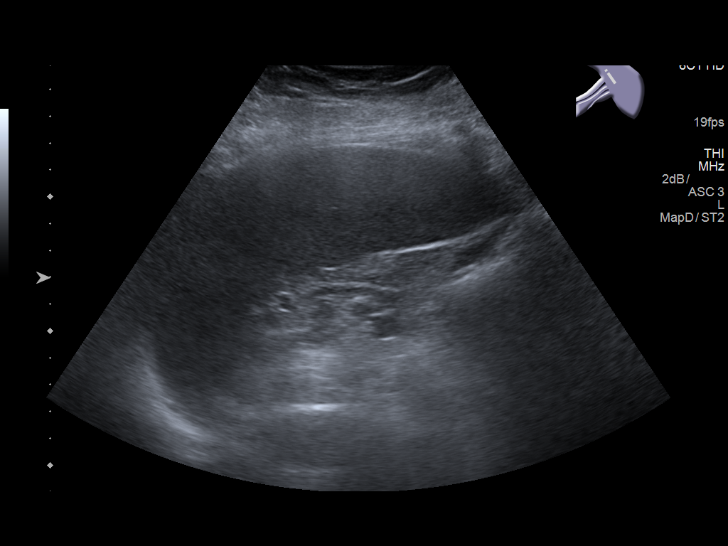
[im 42/77]
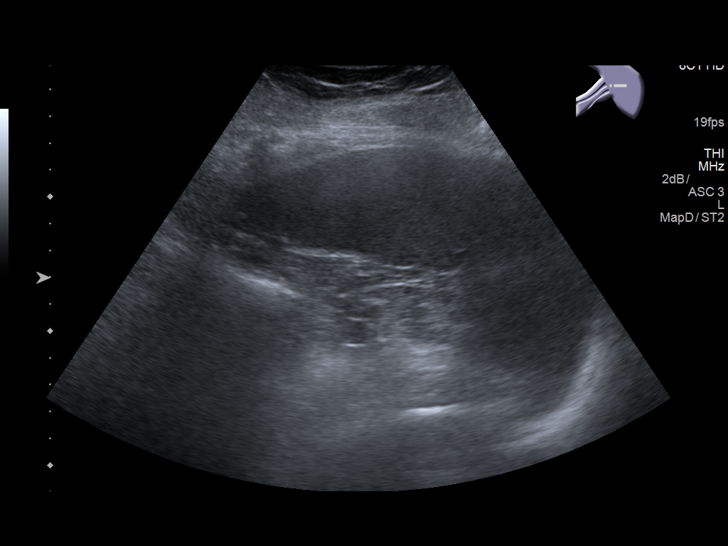
[im 48/77]
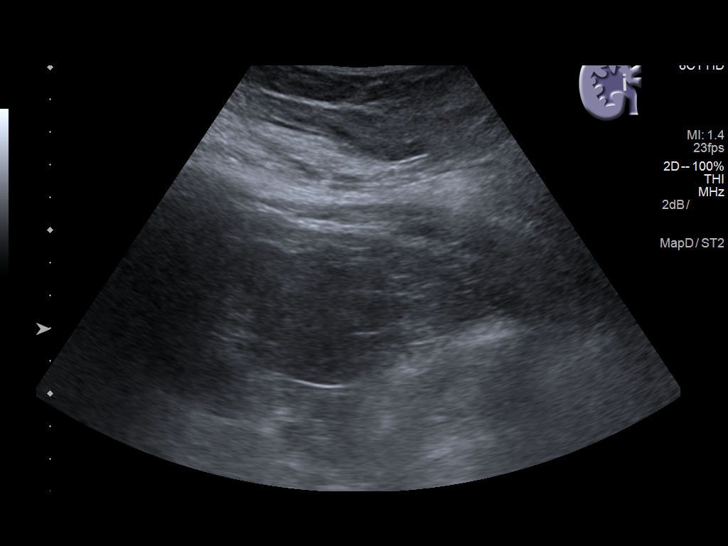
[im 51/77]
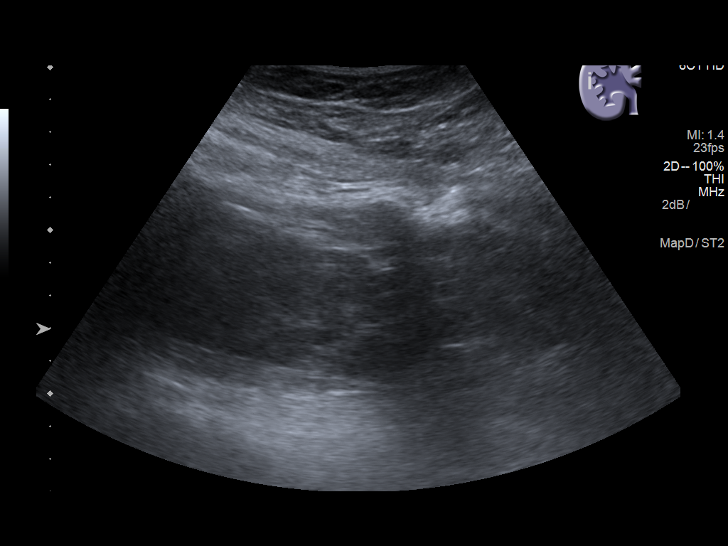
[im 58/77]
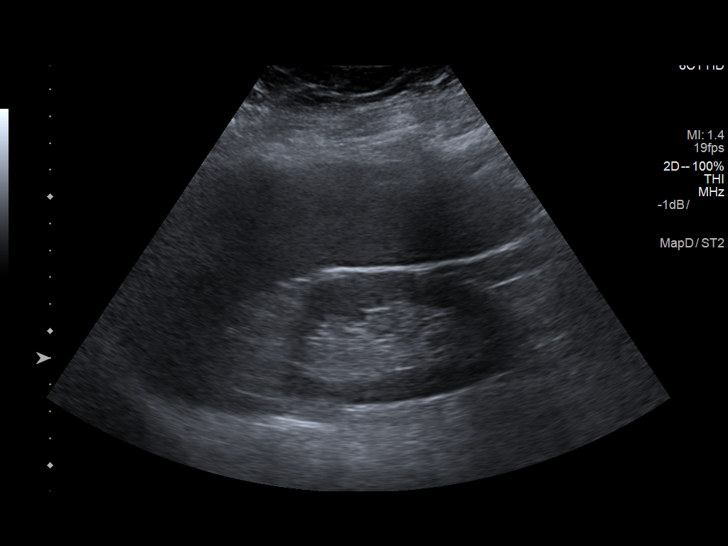
[im 64/77]
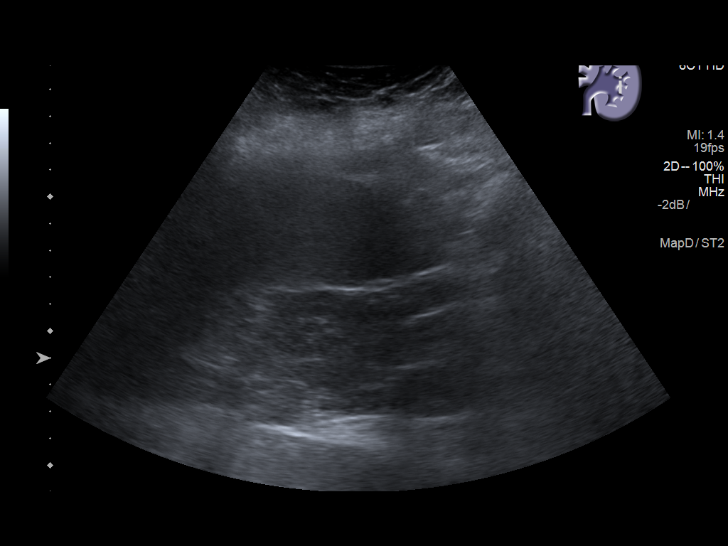
[im 70/77]
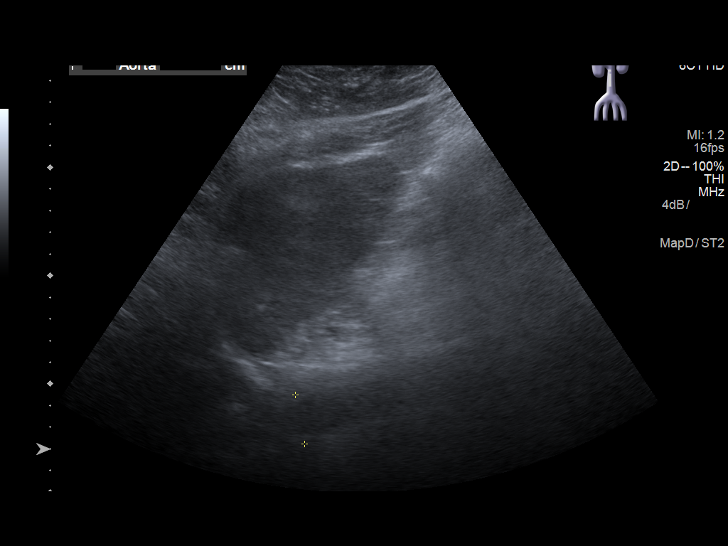
[im 77/77]
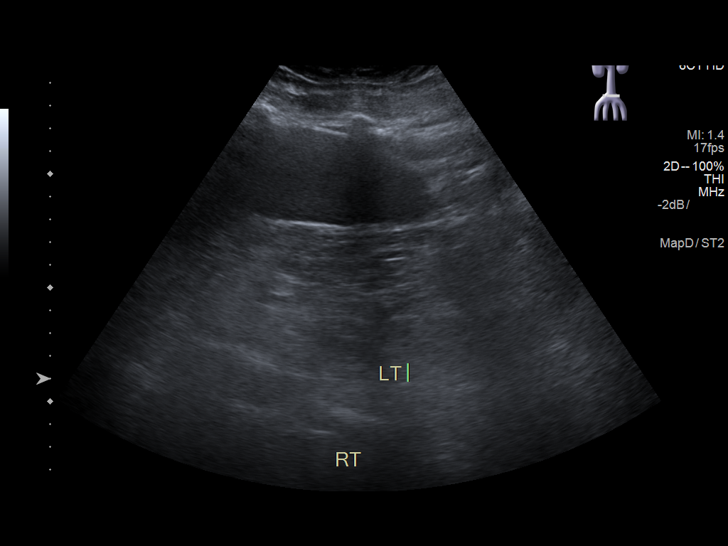

[14 of 25 positions shown; findings below may reference images not displayed]

FINDINGS: Gallbladder: Status post cholecystectomy.

Common bile duct: Diameter: 3.8 mm which is within normal limits.

Liver: No focal lesion identified. Coarse echotexture of hepatic
parenchyma is noted with nodular hepatic contours suggesting hepatic
cirrhosis. Portal vein is patent on color Doppler imaging with
normal direction of blood flow towards the liver.

IVC: No abnormality visualized.

Pancreas: Visualized portion unremarkable.

Spleen: Maximum measured diameter of 15.5 cm with calculated volume
of 485 cubic cm consistent with mild splenomegaly.

Right Kidney: Length: 9.9 cm. Echogenicity within normal limits. No
mass or hydronephrosis visualized.

Left Kidney: Length: 9.9 cm. Echogenicity within normal limits. No
mass or hydronephrosis visualized.

Abdominal aorta: No aneurysm visualized.

Other findings: None.
IMPRESSION: Hepatic cirrhosis with mild splenomegaly. Status post
cholecystectomy. No other abnormality seen in the abdomen.

## 2019-03-26 ENCOUNTER — Other Ambulatory Visit: Payer: Self-pay

## 2019-03-26 ENCOUNTER — Other Ambulatory Visit
Admission: RE | Admit: 2019-03-26 | Discharge: 2019-03-26 | Disposition: A | Discharge status: Home / Self Care | Payer: Medicare Other | Source: Ambulatory Visit | Attending: Gastroenterology | Admitting: Gastroenterology

## 2019-03-26 ENCOUNTER — Telehealth: Payer: Self-pay

## 2019-03-26 DIAGNOSIS — R197 Diarrhea, unspecified: Secondary | ICD-10-CM | POA: Insufficient documentation

## 2019-03-26 LAB — HEPATIC FUNCTION PANEL
ALT: 15 U/L (ref 0–44)
AST: 22 U/L (ref 15–41)
Albumin: 3.5 g/dL (ref 3.5–5.0)
Alkaline Phosphatase: 162 U/L — ABNORMAL HIGH (ref 38–126)
Bilirubin, Direct: 0.2 mg/dL (ref 0.0–0.2)
Indirect Bilirubin: 0.4 mg/dL (ref 0.3–0.9)
Total Bilirubin: 0.6 mg/dL (ref 0.3–1.2)
Total Protein: 6.8 g/dL (ref 6.5–8.1)

## 2019-03-26 NOTE — Telephone Encounter (Signed)
Pt notified of labs requested by Dr. Allen Norris. Contacted [facility] and set up transportation for pt.

## 2019-03-26 NOTE — Telephone Encounter (Signed)
-----   Message from Lucilla Lame, MD sent at 03/25/2019  4:00 PM EDT ----- Regarding: RE: Lfts and stool panel. ----- Message ----- From: Glennie [place], CMA Sent: 03/25/2019   3:26 PM EDT To: Lucilla Lame, MD  Pt called stating she has had a change of bowel habits. She went from constipation to loose stools. They are gray colored and have a different odor than normal stool. She has noticed some mucus and having some abdominal pain.  No blood seen. No fever. Office appt or do you have any recommendations? Just had a colonoscopy and EGD.

## 2019-03-28 ENCOUNTER — Other Ambulatory Visit
Admission: RE | Admit: 2019-03-28 | Discharge: 2019-03-28 | Disposition: A | Discharge status: Home / Self Care | Payer: Medicare Other | Source: Ambulatory Visit | Attending: Gastroenterology | Admitting: Gastroenterology

## 2019-03-28 DIAGNOSIS — R197 Diarrhea, unspecified: Secondary | ICD-10-CM | POA: Diagnosis present

## 2019-03-31 LAB — GI PATHOGEN PANEL BY PCR, STOOL

## 2019-04-01 ENCOUNTER — Telehealth: Payer: Self-pay

## 2019-04-01 NOTE — Telephone Encounter (Signed)
-----   Message from Lucilla Lame, MD sent at 03/29/2019  6:59 AM EDT ----- Let the patient know that her blood test showed her alkaline phosphatase to be the same range as it has been in the past.  It is lower than it was 1 year ago but the same as it was 2 and 4 months ago.

## 2019-04-01 NOTE — Telephone Encounter (Signed)
Pt notified of lab results

## 2019-04-01 NOTE — Telephone Encounter (Signed)
-----   Message from Lucilla Lame, MD sent at 04/01/2019 10:17 AM EDT ----- Let the patient know that the GI panel did not show any infection.

## 2019-04-12 ENCOUNTER — Other Ambulatory Visit: Payer: Self-pay

## 2019-04-15 ENCOUNTER — Other Ambulatory Visit: Payer: Self-pay

## 2019-04-15 ENCOUNTER — Inpatient Hospital Stay: Payer: Medicare Other | Attending: Hematology and Oncology

## 2019-04-15 DIAGNOSIS — D696 Thrombocytopenia, unspecified: Secondary | ICD-10-CM

## 2019-04-15 DIAGNOSIS — K7460 Unspecified cirrhosis of liver: Secondary | ICD-10-CM

## 2019-04-15 LAB — CBC WITH DIFFERENTIAL/PLATELET
Abs Immature Granulocytes: 0.01 10*3/uL (ref 0.00–0.07)
Basophils Absolute: 0.1 10*3/uL (ref 0.0–0.1)
Basophils Relative: 1 %
Eosinophils Absolute: 0.1 10*3/uL (ref 0.0–0.5)
Eosinophils Relative: 3 %
HCT: 32.6 % — ABNORMAL LOW (ref 36.0–46.0)
Hemoglobin: 10.8 g/dL — ABNORMAL LOW (ref 12.0–15.0)
Immature Granulocytes: 0 %
Lymphocytes Relative: 10 %
Lymphs Abs: 0.5 10*3/uL — ABNORMAL LOW (ref 0.7–4.0)
MCH: 31 pg (ref 26.0–34.0)
MCHC: 33.1 g/dL (ref 30.0–36.0)
MCV: 93.7 fL (ref 80.0–100.0)
Monocytes Absolute: 0.5 10*3/uL (ref 0.1–1.0)
Monocytes Relative: 11 %
Neutro Abs: 3.4 10*3/uL (ref 1.7–7.7)
Neutrophils Relative %: 75 %
Platelets: 93 10*3/uL — ABNORMAL LOW (ref 150–400)
RBC: 3.48 MIL/uL — ABNORMAL LOW (ref 3.87–5.11)
RDW: 14.2 % (ref 11.5–15.5)
WBC: 4.5 10*3/uL (ref 4.0–10.5)
nRBC: 0 % (ref 0.0–0.2)

## 2019-04-22 ENCOUNTER — Other Ambulatory Visit: Payer: Self-pay

## 2019-04-22 ENCOUNTER — Ambulatory Visit (INDEPENDENT_AMBULATORY_CARE_PROVIDER_SITE_OTHER): Payer: Medicare Other | Admitting: Pulmonary Disease

## 2019-04-22 ENCOUNTER — Encounter: Payer: Self-pay | Admitting: Pulmonary Disease

## 2019-04-22 VITALS — BP 110/76 | HR 84 | Temp 97.1°F | Wt 229.0 lb

## 2019-04-22 DIAGNOSIS — J4530 Mild persistent asthma, uncomplicated: Secondary | ICD-10-CM | POA: Diagnosis not present

## 2019-04-22 DIAGNOSIS — E669 Obesity, unspecified: Secondary | ICD-10-CM

## 2019-04-22 DIAGNOSIS — G4730 Sleep apnea, unspecified: Secondary | ICD-10-CM

## 2019-04-22 DIAGNOSIS — G4733 Obstructive sleep apnea (adult) (pediatric): Secondary | ICD-10-CM

## 2019-04-22 MED ORDER — ALBUTEROL SULFATE HFA 108 (90 BASE) MCG/ACT IN AERS: 2.0000 | INHALATION_SPRAY | Freq: Four times a day (QID) | RESPIRATORY_TRACT | 5 refills | Status: DC | PRN

## 2019-04-22 NOTE — Progress Notes (Signed)
[facility]  Chief Complaint  Patient presents with  . Follow-up    Constitutional:  BP 110/76 (BP Location: Right Arm, Cuff Size: Normal)   Pulse 84   Temp (!) 97.1 F (36.2 C) (Temporal)   Wt 229 lb (103.9 kg) Comment: with shoes  SpO2 95%   BMI 40.57 kg/m   Past Medical History:  Heb B, HLD, Bipolar, Anxiety, Depression, CVA, GERD, Cirrhosis, Type 1 aortic dissection, DM, thrombocytopenia, RLS  Brief Summary:  Stephanie [name] is a 68 y.o. female former smoker with OSA, dyspnea with asthma and deconditioning.  She is doing well with CPAP.  Has full face mask.  Only issue is that mask will leak at times.  She is sleeping better and feels more rested.  Not having sinus congestion, sore throat, dry mouth, or aerophagia.  She gets winded easily with exertion.  Recovers after resting for about a minute.  Not having cough or wheeze.  Not having chest pain.  She is not very active.   Physical Exam:   Appearance - well kempt   ENMT - no sinus tenderness, no nasal discharge, no oral exudate  Neck - no masses, trachea midline, no thyromegaly, no elevation in JVP  Respiratory - normal appearance of chest wall, normal respiratory effort w/o accessory muscle use, no dullness on percussion, no wheezing or rales  CV - s1s2 regular rate and rhythm, no murmurs, Rt > Lt peripheral edema, radial pulses symmetric  GI - soft, non tender  Lymph - no adenopathy noted in neck and axillary areas  MSK - normal gait  Ext - no cyanosis, clubbing, or joint inflammation noted  Skin - no rashes, lesions, or ulcers  Neuro - normal strength, oriented x 3  Psych - normal mood and affect    Assessment/Plan:   Obstructive sleep apnea. - she is compliant with CPAP and reports benefit from therapy - has some mask leak, but mild - if mask leak gets worse, then can refit her mask - continue auto CPAP  Mild, intermittent asthma. - prn albuterol   Obesity. - discussed how her weight is impacting her health  Dyspnea on exertion. - most likely from obesity and deconditioning  Leg edema. - followed by PCP   Patient Instructions  Call if you need to get your CPAP mask refitted to help stop mask air leaking  Albuterol two puffs every 6 hours as needed for cough, wheeze, chest congestion or shortness of breath  Follow up in 1 year    Chesley Mires, MD [facility] Pager: [phone] 04/22/2019, 11:11 AM  Flow Sheet     Pulmonary tests:  PFT 02/07/14 >> FEV1 1.94 (66%), FEV1% 91, TLC 3.62 (77%), DLCO 75%, +BD  Chest imaging:  CT chest 06/28/16 >> mild emphysema, coronary calcifications, cirrhosis with portal hypertension, splenomegaly CT chest 07/27/18 >> mild CM, atherosclerosis, patchy GGO, 3 mm nodule RUL resolved, cirrhosis of liver, splenomegaly  Sleep tests:  PSG 08/13/13 >> REM AHI 27.9, SaO2 low 82%, Spent 15.1 min with SpO2 < 90%. CPAP 09/06/13 >> CPAP 8 >> AHI 0, + R, + S. PSG 10/18/13 >> AHI 6.9, SaO2 low 82%. Spent 139.2 min with SaO2 < 89%. ONO with CPAP and RA 11/11/13 >> Test time 8 hrs 59 min. Mean SpO2 92%, low SpO2 81%. Spent 9 min with SpO2 < 88% CPAP 06/11/16 to 07/10/16 >> used on 13 of 30 nights with average 4 hrs 50 min.  Average  AHI 6.7 with CPAP 8 cm H2O PSG 04/27/18 >> AHI 10.2, SpO2 low 77% Auto CPAP 03/20/19 to 04/18/19 >> used on 30 of 30 nights with average 6 hrs 13 min.  Average AHI 12.1 with median CPAP 8 and 95 th percentile CPAP 11 cm H2O.  Air leak.  Cardiac tests:  Echo 11/14/13 >> EF 55 to 60%   Medications:   Allergies as of 04/22/2019      Reactions   Cortisone    Increased BS   Doxycycline Hives, Swelling, Other (See Comments), Nausea Only   Reaction:  Facial swelling  Face red and swollen; no difficulty breathing      Medication List       Accurate as of April 22, 2019 11:11 AM. If you have any questions, ask your nurse or doctor.        STOP taking  these medications   diphenhydrAMINE 25 mg capsule Commonly known as: BENADRYL Stopped by: Chesley Mires, MD     TAKE these medications   albuterol (2.5 MG/3ML) 0.083% nebulizer solution Commonly known as: PROVENTIL Take 2.5 mg by nebulization every 6 (six) hours as needed for wheezing or shortness of breath.   albuterol 108 (90 Base) MCG/ACT inhaler Commonly known as: VENTOLIN HFA Inhale 2 puffs into the lungs every 6 (six) hours as needed for wheezing or shortness of breath.   buPROPion 100 MG 12 hr tablet Commonly known as: WELLBUTRIN SR Take 1 tablet (100 mg total) by mouth daily after breakfast.   clonazePAM 0.25 MG disintegrating tablet Commonly known as: KLONOPIN Take 0.25 mg by mouth 2 (two) times daily.   diclofenac sodium 1 % Gel Commonly known as: VOLTAREN Apply topically 3 (three) times daily.   dicyclomine 10 MG capsule Commonly known as: BENTYL Take 10 mg by mouth 3 (three) times daily.   donepezil 10 MG tablet Commonly known as: ARICEPT Take 10 mg by mouth at bedtime.   ferrous sulfate 325 (65 FE) MG tablet Take 325 mg by mouth 2 (two) times daily.   furosemide 80 MG tablet Commonly known as: LASIX Take 40 mg by mouth daily.   gabapentin 300 MG capsule Commonly known as: NEURONTIN Take 1 capsule (300 mg total) by mouth 3 (three) times daily. Takes 300 mg at 8 am , 2pm, 300 mg 8 pm. What changed: additional instructions   Geri-kot 8.6 MG tablet Generic drug: senna Take 2 tablets by mouth daily.   HYDROcodone-acetaminophen 7.5-325 MG tablet Commonly known as: NORCO Take 1 tablet by mouth every 6 (six) hours as needed for moderate pain.   insulin glargine 100 UNIT/ML injection Commonly known as: LANTUS Inject 42 Units into the skin every evening.   insulin lispro 100 UNIT/ML injection Commonly known as: HUMALOG Inject 8 Units into the skin 3 (three) times daily before meals.   lactulose 10 GM/15ML solution Commonly known as: CHRONULAC Take 10  g by mouth daily. (0800)   lamoTRIgine 150 MG tablet Commonly known as: LaMICtal Take 1 tablet (150 mg total) by mouth 2 (two) times daily.   lidocaine 5 % Commonly known as: LIDODERM Place 1 patch onto the skin daily.   liraglutide 18 MG/3ML Sopn Commonly known as: VICTOZA Inject 1.8 mg into the skin daily.   loperamide 2 MG capsule Commonly known as: IMODIUM Take 4 mg by mouth as needed for diarrhea or loose stools.   metoCLOPramide 5 MG tablet Commonly known as: REGLAN Take 5 mg by mouth 3 (three) times daily before  meals. (0800, 1200, 1600 & 2000)   nystatin powder Commonly known as: MYCOSTATIN/NYSTOP Apply topically 4 (four) times daily. What changed:   when to take this  additional instructions   omeprazole 20 MG capsule Commonly known as: PRILOSEC Take 20 mg by mouth daily.   ondansetron 4 MG tablet Commonly known as: ZOFRAN Take 4 mg by mouth 3 (three) times daily before meals.   PARoxetine 10 MG tablet Commonly known as: PAXIL Take 1 tablet (10 mg total) by mouth daily.   potassium chloride SA 20 MEQ tablet Commonly known as: KLOR-CON Take 20 mEq by mouth daily.   pregabalin 75 MG capsule Commonly known as: LYRICA   Restasis 0.05 % ophthalmic emulsion Generic drug: cycloSPORINE Place 1 drop into both eyes 2 (two) times daily.   rOPINIRole 3 MG tablet Commonly known as: REQUIP Take 3 mg by mouth at bedtime. (2000)   simvastatin 10 MG tablet Commonly known as: ZOCOR Take 10 mg by mouth at bedtime.       Past Surgical History:  She  has a past surgical history that includes Cholecystectomy; Rotator cuff repair; Abdominal hysterectomy; Lumbar fusion (10/09); Hemorroidectomy; Abdominal exploration surgery; Posterior cervical fusion/foraminotomy; Lumbar wound debridement (05/09/2011); Axillary artery cannulation via 8-mm Hemashield graft, median sternotomy, extracorporeal circulation with deep hypothermic circulatory arrest, repair of  aortic  dessection (01/23/2009); cataract; Colonoscopy; Liver biopsy (04/10/2012); Back surgery; Eye surgery (Bilateral); Knee arthroscopy with lateral release (Right, 11/15/2016); Facial laceration repair (Left, 04/11/2018); Colonoscopy with propofol (N/A, 02/26/2019); and Esophagogastroduodenoscopy (egd) with propofol (02/26/2019).  Family History:  Her family history includes Anxiety disorder in her brother; Breast cancer in her mother; Depression in her brother; Diabetes in an other family member; Lung cancer in her mother; Stomach cancer in her father.  Social History:  She  reports that she quit smoking about 6 years ago. Her smoking use included cigarettes. She has a 75.00 pack-year smoking history. She has never used smokeless tobacco. She reports that she does not drink alcohol or use drugs.

## 2019-04-22 NOTE — Patient Instructions (Signed)
Call if you need to get your CPAP mask refitted to help stop mask air leaking  Albuterol two puffs every 6 hours as needed for cough, wheeze, chest congestion or shortness of breath  Follow up in 1 year

## 2019-05-01 IMAGING — DX PORTABLE CHEST - 1 VIEW
1 series · 2 of 2 positions shown · non-contrast
Comparison: CT, 07/27/2018.  Chest radiograph, 04/27/2018.

CLINICAL DATA: c/o bilateral pleural chest pain. Hx of stroke,
hypertension, diabetes, COPD, asthma, aortic dissection. Former
smoker

EXAM:
PORTABLE CHEST 1 VIEW

[Series 1: chest ap · 0.14mm/px · 2 of 2 slices shown]
[im 1/2]
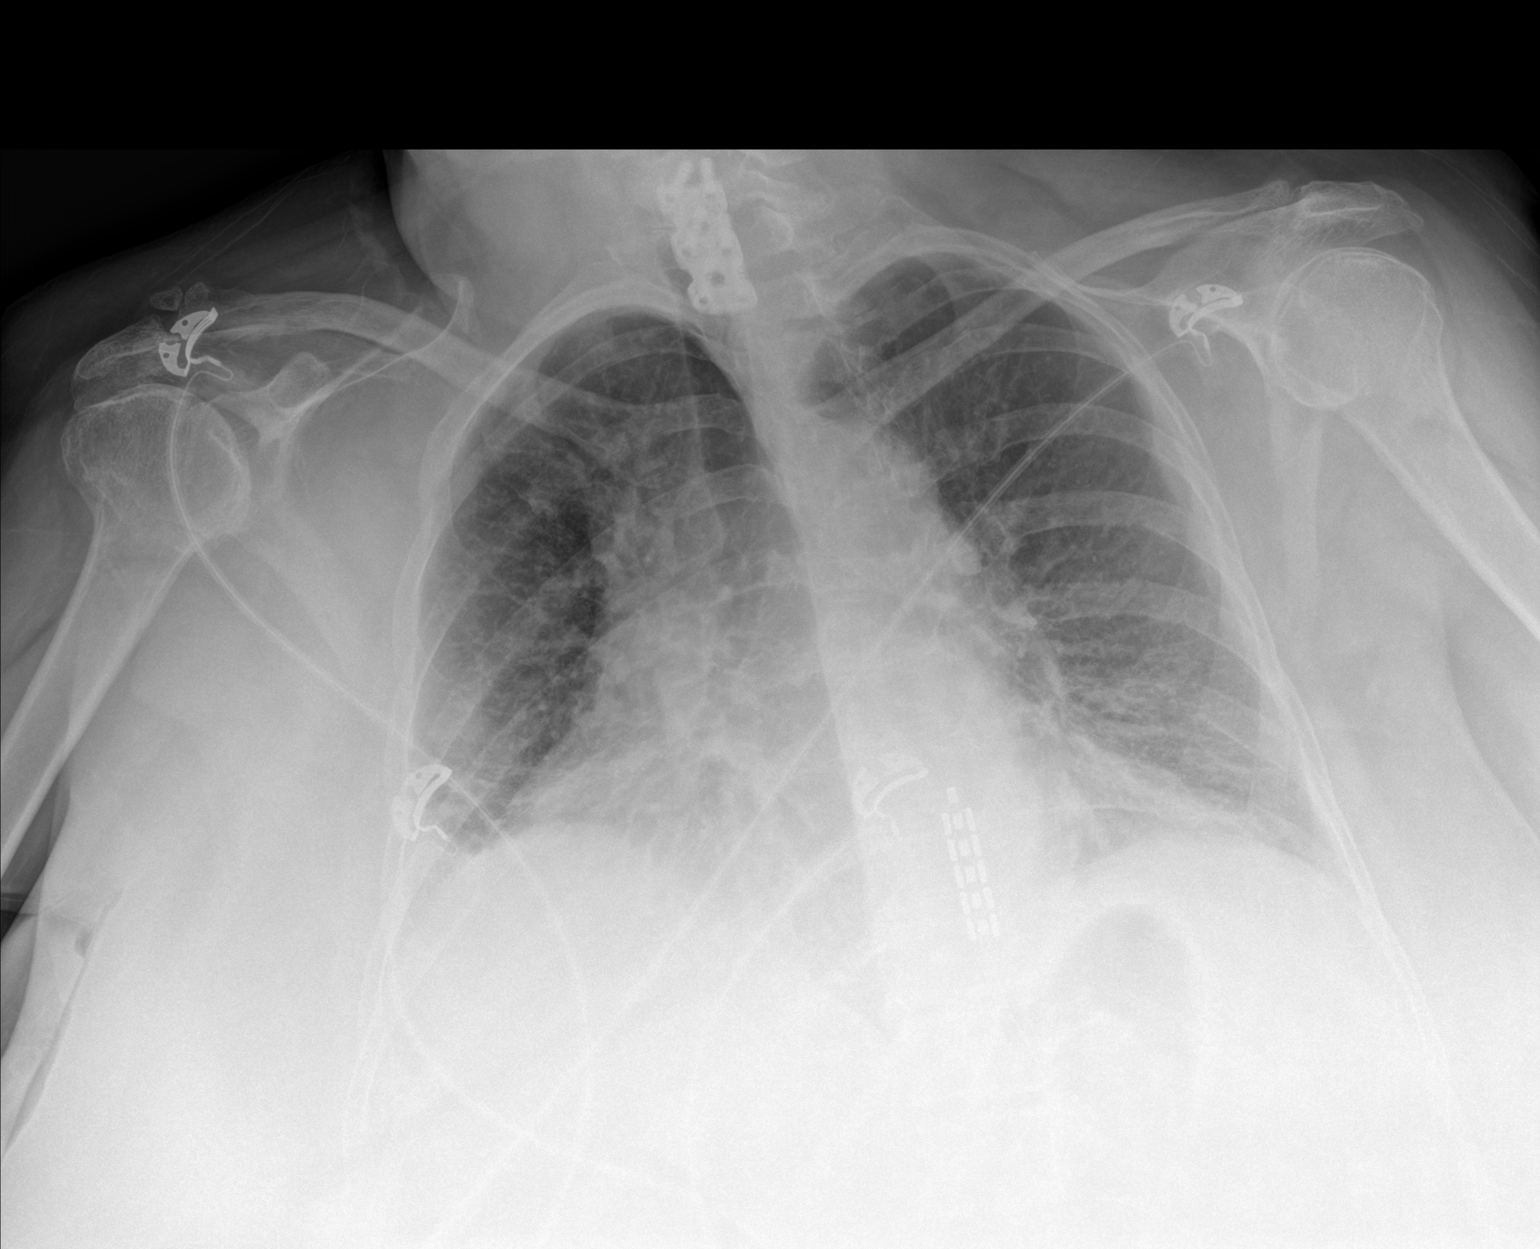
[im 2/2]
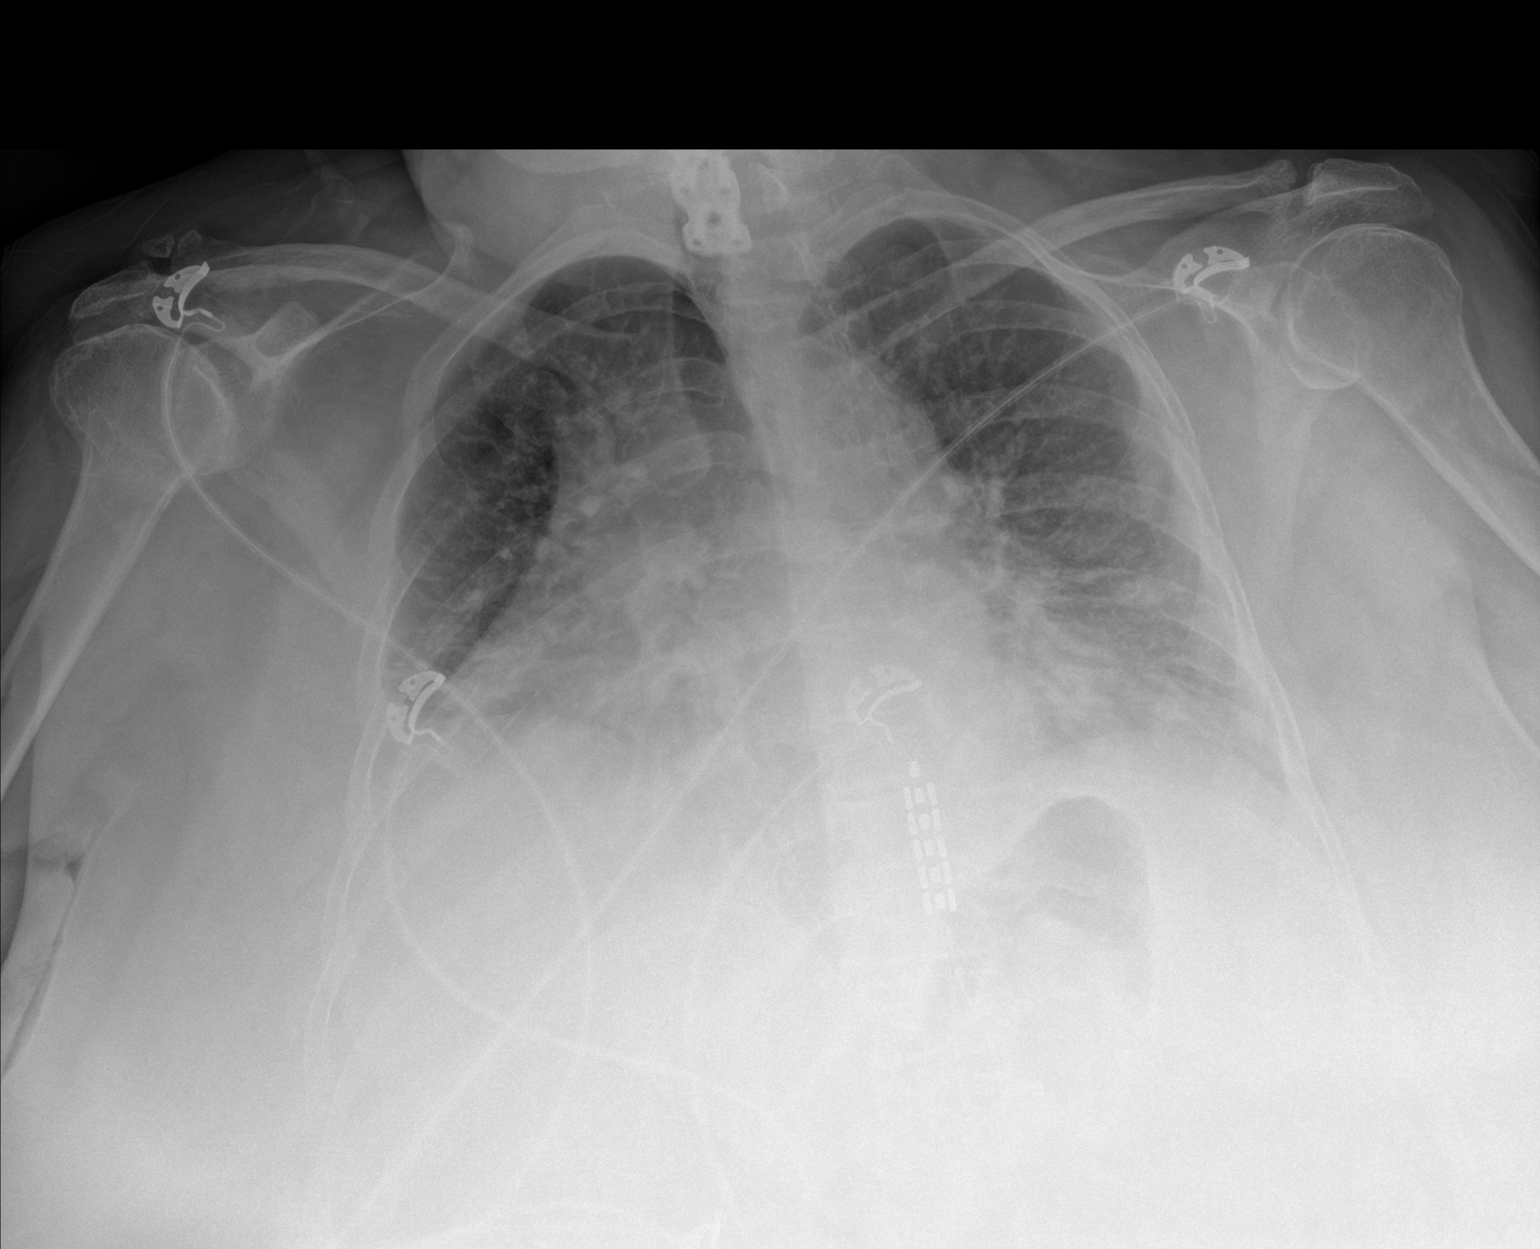

[2 of 2 positions shown; findings below may reference images not displayed]

FINDINGS: Mild enlargement of the cardiopericardial silhouette.

No mediastinal or hilar masses.

Lung volumes are low.  Lungs are clear.

No pleural effusion or pneumothorax.

Skeletal structures are grossly intact. Stable changes from a prior
anterior cervical spine fusion.
IMPRESSION: No acute cardiopulmonary disease.

## 2019-05-08 ENCOUNTER — Ambulatory Visit (INDEPENDENT_AMBULATORY_CARE_PROVIDER_SITE_OTHER): Payer: Medicare Other | Admitting: Psychiatry

## 2019-05-08 ENCOUNTER — Encounter: Payer: Self-pay | Admitting: Psychiatry

## 2019-05-08 ENCOUNTER — Other Ambulatory Visit: Payer: Self-pay

## 2019-05-08 DIAGNOSIS — F3176 Bipolar disorder, in full remission, most recent episode depressed: Secondary | ICD-10-CM | POA: Diagnosis not present

## 2019-05-08 DIAGNOSIS — F411 Generalized anxiety disorder: Secondary | ICD-10-CM | POA: Diagnosis not present

## 2019-05-08 DIAGNOSIS — G4452 New daily persistent headache (NDPH): Secondary | ICD-10-CM | POA: Insufficient documentation

## 2019-05-08 DIAGNOSIS — F5105 Insomnia due to other mental disorder: Secondary | ICD-10-CM

## 2019-05-08 DIAGNOSIS — L03115 Cellulitis of right lower limb: Secondary | ICD-10-CM | POA: Insufficient documentation

## 2019-05-08 DIAGNOSIS — R197 Diarrhea, unspecified: Secondary | ICD-10-CM | POA: Insufficient documentation

## 2019-05-08 DIAGNOSIS — L299 Pruritus, unspecified: Secondary | ICD-10-CM | POA: Insufficient documentation

## 2019-05-08 DIAGNOSIS — I872 Venous insufficiency (chronic) (peripheral): Secondary | ICD-10-CM | POA: Insufficient documentation

## 2019-05-08 DIAGNOSIS — K2940 Chronic atrophic gastritis without bleeding: Secondary | ICD-10-CM | POA: Insufficient documentation

## 2019-05-08 DIAGNOSIS — D229 Melanocytic nevi, unspecified: Secondary | ICD-10-CM | POA: Insufficient documentation

## 2019-05-08 DIAGNOSIS — L309 Dermatitis, unspecified: Secondary | ICD-10-CM | POA: Insufficient documentation

## 2019-05-08 DIAGNOSIS — R4589 Other symptoms and signs involving emotional state: Secondary | ICD-10-CM | POA: Insufficient documentation

## 2019-05-08 DIAGNOSIS — R600 Localized edema: Secondary | ICD-10-CM | POA: Insufficient documentation

## 2019-05-08 MED ORDER — LAMOTRIGINE 150 MG PO TABS: 150.0000 mg | ORAL_TABLET | Freq: Two times a day (BID) | ORAL | 0 refills | Status: DC

## 2019-05-08 MED ORDER — SERTRALINE HCL 25 MG PO TABS: 25.0000 mg | ORAL_TABLET | Freq: Every day | ORAL | 1 refills | Status: DC

## 2019-05-08 MED ORDER — BUPROPION HCL ER (SR) 100 MG PO TB12: 100.0000 mg | ORAL_TABLET | Freq: Every day | ORAL | 0 refills | Status: DC

## 2019-05-08 NOTE — Progress Notes (Signed)
Virtual Visit via Telephone Note  I connected with Stephanie [name] on 05/08/19 at 10:30 AM EST by telephone and verified that I am speaking with the correct person using two identifiers.   I discussed the limitations, risks, security and privacy concerns of performing an evaluation and management service by telephone and the availability of in person appointments. I also discussed with the patient that there may be a patient responsible charge related to this service. The patient expressed understanding and agreed to proceed.     I discussed the assessment and treatment plan with the patient. The patient was provided an opportunity to ask questions and all were answered. The patient agreed with the plan and demonstrated an understanding of the instructions.   The patient was advised to call back or seek an in-person evaluation if the symptoms worsen or if the condition fails to improve as anticipated.   [place] MD OP Progress Note  05/08/2019 11:44 AM TIFANNY DOLLENS  MRN:  [id number]  Chief Complaint:  Chief Complaint    Follow-up     HPI: Stephanie [name] is a 68 year old Caucasian female, divorced, lives at [facility], on SSD, has a history of bipolar disorder, GAD, restless leg syndrome, chronic pain, fibromyalgia, insomnia, COPD, MCI, history of lung nodules, sleep apnea, ascites, history of hepatitis B, history of stroke, vitamin D deficiency, aortic dissection, thrombocytopenia, chronic anemia, hypertension, hyperlipidemia was evaluated by phone today.  Patient preferred to do a phone call.  Collateral information was obtained from Candelaria Arenas at assisted living facility.  Patient as well as Varney Biles was able to verify all of the medications that patient is currently taking.  Patient today reports she is currently doing well with regards to her mood swings.  She denies any depressive symptoms or manic episodes.  She however reports she has been feeling nervous and  anxious and shaky.  She does have a history of tremors and she currently works with neurology.  Patient reports she does not believe the Paxil is helpful with her nervousness.  She also has been noncompliant with her therapy visits.  Patient appeared to be alert, oriented to person place time and situation.  Patient denies any other concerns today.   Visit Diagnosis:    ICD-10-CM   1. Bipolar disorder, in full remission, most recent episode depressed ([place])  F31.76 buPROPion (WELLBUTRIN SR) 100 MG 12 hr tablet    lamoTRIgine (LAMICTAL) 150 MG tablet  2. GAD (generalized anxiety disorder)  F41.1 sertraline (ZOLOFT) 25 MG tablet  3. Insomnia due to mental disorder  F51.05     Past Psychiatric History: I have reviewed past psychiatric history from my progress note on 12/23/2017.  Past trials of Seroquel, trazodone, Ambien.  Past Medical History:  Past Medical History:  Diagnosis Date  . Adenomatous colon polyp 08/27/2014   Polyps x 3  . Anemia   . Anxiety   . 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 Arthritis   . Asthma   . Bipolar affective disorder ([place])    h/o  . Blood transfusion 2000  . Blood transfusion without reported diagnosis   . Cancer ([place])   . Chronic abdominal pain    abdominal wall pain  . 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 Colon polyp   . COPD (chronic obstructive pulmonary disease) ([place])   . Depression   . Diabetes mellitus without complication ([place])   . Fatty liver 04/09/08   found  in abd CT  . GERD (gastroesophageal reflux disease)   . H/O: CVA (cardiovascular accident)    TIA  . H/O: rheumatic fever   . Hepatitis    HEP "B" twenty years ago  . Hyperlipidemia   . Hypertension   . IBS (irritable bowel syndrome)   . Incontinence   . Insomnia   . Lower extremity edema   . Neuropathy    lower legs D/T DM  . Obstructive sleep apnea    Use C-PAP twice per week  . Platelets decreased ([place])   . RLS (restless legs syndrome)   . Shortness of breath    . Sleep apnea   . Stroke [facility])    TIA- 2002  . Ulcer   . Unspecified disorders of nervous system   . Wears dentures    full upper and lower    Past Surgical History:  Procedure Laterality Date  . ABDOMINAL EXPLORATION SURGERY    . ABDOMINAL HYSTERECTOMY     total  . Axillary artery cannulation via 8-mm Hemashield graft, median sternotomy, extracorporeal circulation with deep hypothermic circulatory arrest, repair of  aortic dessection  01/23/2009   Dr Arlyce Dice  . BACK SURGERY      x 5  . cataract     both eyes  . CHOLECYSTECTOMY    . COLONOSCOPY    . COLONOSCOPY WITH PROPOFOL N/A 02/26/2019   Procedure: COLONOSCOPY WITH PROPOFOL;  Surgeon: Lucilla Lame, MD;  Location: [facility] ENDOSCOPY;  Service: Endoscopy;  Laterality: N/A;  . ESOPHAGOGASTRODUODENOSCOPY (EGD) WITH PROPOFOL  02/26/2019   Procedure: ESOPHAGOGASTRODUODENOSCOPY (EGD) WITH PROPOFOL;  Surgeon: Lucilla Lame, MD;  Location: [facility] ENDOSCOPY;  Service: Endoscopy;;  . EYE SURGERY Bilateral    Cataract Extraction with IOL  . FACIAL LACERATION REPAIR Left 04/11/2018   Procedure: EARLOBE REPAIR;  Surgeon: Carloyn Manner, MD;  Location: [facility];  Service: ENT;  Laterality: Left;  Diabetic - insulin and oral meds sleep apnea  . HEMORROIDECTOMY     and colon polyp removed  . KNEE ARTHROSCOPY WITH LATERAL RELEASE Right 11/15/2016   Procedure: KNEE ARTHROSCOPY WITH LATERAL RELEASE;  Surgeon: Corky Mull, MD;  Location: [facility] ORS;  Service: Orthopedics;  Laterality: Right;  . LIVER BIOPSY  04/10/2012   Procedure: LIVER BIOPSY;  Surgeon: Inda Castle, MD;  Location: [facility] ENDOSCOPY;  Service: Endoscopy;  Laterality: N/A;  ultrasound to mark order for abd limited/liver to be marked to be sent by [email or web address]   . LUMBAR FUSION  10/09  . LUMBAR WOUND DEBRIDEMENT  05/09/2011   Procedure: LUMBAR WOUND DEBRIDEMENT;  Surgeon: Dahlia Bailiff;  Location: [facility];  Service: Orthopedics;  Laterality: N/A;  IRRIGATION AND DEBRIDEMENT  SPINAL WOUND  . POSTERIOR CERVICAL FUSION/FORAMINOTOMY    . ROTATOR CUFF REPAIR     left    Family Psychiatric History: Reviewed family psychiatric history from my progress note on 12/23/2017. Family History:  Family History  Problem Relation Age of Onset  . Breast cancer Mother   . Lung cancer Mother   . Stomach cancer Father   . Diabetes Other        4 aunts, and 1 uncle  . Depression Brother   . Anxiety disorder Brother   . Colon cancer Neg Hx   . Esophageal cancer Neg Hx   . Rectal cancer Neg Hx     Social History: Reviewed social history from my progress note on 12/23/2017. Social History   Socioeconomic History  . Marital status: Divorced  Spouse name: Not on file  . Number of children: 2  . Years of education: Not on file  . Highest education level: 8th grade  Occupational History  . Occupation: Disabled    Employer: [employer]  Social Needs  . Financial resource strain: Not hard at all  . Food insecurity    Worry: Never true    Inability: Never true  . Transportation needs    Medical: No    Non-medical: No  Tobacco Use  . Smoking status: Former Smoker    Packs/day: 1.50    Years: 50.00    Pack years: 75.00    Types: Cigarettes    Quit date: 04/06/2013    Years since quitting: 6.0  . Smokeless tobacco: Never Used  Substance and Sexual Activity  . Alcohol use: No    Alcohol/week: 0.0 standard drinks  . Drug use: No  . Sexual activity: Never  Lifestyle  . Physical activity    Days per week: 0 days    Minutes per session: 0 min  . Stress: Very much  Relationships  . Social Herbalist on phone: Not on file    Gets together: Not on file    Attends religious service: Never    Active member of club or organization: No    Attends meetings of clubs or organizations: Never    Relationship status: Divorced  Other Topics Concern  . Not on file  Social History Narrative   1 son, 4+ y/o   Daily caffeine use: 2 cups daily   Does not get  regular exercise   Disability due to bipolar      Has a living will- she is a DNR (she has form at home per pt).    Allergies:  Allergies  Allergen Reactions  . Cortisone     Increased BS  . Doxycycline Hives, Swelling, Other (See Comments) and Nausea Only    Reaction:  Facial swelling  Face red and swollen; no difficulty breathing  . Morphine Other (See Comments)    Metabolic Disorder Labs: Lab Results  Component Value Date   HGBA1C 5.6 11/20/2018   MPG 114.02 11/20/2018   MPG 103 07/20/2016   No results found for: PROLACTIN Lab Results  Component Value Date   CHOL 181 11/20/2018   TRIG 59 11/20/2018   HDL 97 11/20/2018   CHOLHDL 1.9 11/20/2018   VLDL 12 11/20/2018   LDLCALC 72 11/20/2018   LDLCALC 75 04/05/2016   Lab Results  Component Value Date   TSH 1.583 11/20/2018   TSH 4.486 07/19/2017    Therapeutic Level Labs: No results found for: LITHIUM No results found for: VALPROATE No components found for:  CBMZ  Current Medications: Current Outpatient Medications  Medication Sig Dispense Refill  . albuterol (PROVENTIL) (2.5 MG/3ML) 0.083% nebulizer solution Take 2.5 mg by nebulization every 6 (six) hours as needed for wheezing or shortness of breath.    Marland Kitchen albuterol (VENTOLIN HFA) 108 (90 Base) MCG/ACT inhaler Inhale 2 puffs into the lungs every 6 (six) hours as needed for wheezing or shortness of breath. 6.7 g 5  . buPROPion (WELLBUTRIN SR) 100 MG 12 hr tablet Take 1 tablet (100 mg total) by mouth daily after breakfast. 90 tablet 0  . clonazePAM (KLONOPIN) 0.25 MG disintegrating tablet Take 0.25 mg by mouth 2 (two) times daily.     . diclofenac sodium (VOLTAREN) 1 % GEL Apply topically 3 (three) times daily.    Marland Kitchen dicyclomine (BENTYL) 10  MG capsule Take 10 mg by mouth 3 (three) times daily.     Marland Kitchen donepezil (ARICEPT) 10 MG tablet Take 10 mg by mouth at bedtime.     . ferrous sulfate 325 (65 FE) MG tablet Take 325 mg by mouth 2 (two) times daily.     Marland Kitchen gabapentin  (NEURONTIN) 300 MG capsule Take 1 capsule (300 mg total) by mouth 3 (three) times daily. Takes 300 mg at 8 am , 2pm, 300 mg 8 pm. (Patient taking differently: Take 300 mg by mouth 3 (three) times daily. Takes 300 mg at 8 am , 2pm, Taking 600 mg 8 pm.) 270 capsule 1  . gabapentin (NEURONTIN) 600 MG tablet     . hydrochlorothiazide (HYDRODIURIL) 25 MG tablet Take 25 mg by mouth daily.    Marland Kitchen HYDROcodone-acetaminophen (NORCO) 7.5-325 MG tablet Take 1 tablet by mouth every 6 (six) hours as needed for moderate pain.    . hydrOXYzine (ATARAX/VISTARIL) 10 MG tablet Take 10 mg by mouth 2 (two) times daily.    . insulin glargine (LANTUS) 100 UNIT/ML injection Inject 42 Units into the skin every evening.     . insulin lispro (HUMALOG) 100 UNIT/ML injection Inject 8 Units into the skin 3 (three) times daily before meals.     . lactulose (CHRONULAC) 10 GM/15ML solution Take 10 g by mouth daily. (0800)    . lamoTRIgine (LAMICTAL) 150 MG tablet Take 1 tablet (150 mg total) by mouth 2 (two) times daily. 180 tablet 0  . lidocaine (LIDODERM) 5 % Place 1 patch onto the skin daily.     Marland Kitchen liraglutide (VICTOZA) 18 MG/3ML SOPN Inject 1.8 mg into the skin daily.    Marland Kitchen loperamide (IMODIUM) 2 MG capsule Take 4 mg by mouth as needed for diarrhea or loose stools.    . metoCLOPramide (REGLAN) 5 MG tablet Take 5 mg by mouth 3 (three) times daily before meals. (0800, 1200, 1600 & 2000)     . nystatin (MYCOSTATIN/NYSTOP) powder Apply topically 4 (four) times daily. (Patient taking differently: Apply topically 2 (two) times daily. Apply topically to the are under abdomen 2 times a day x 4 days Per Nursing Home [facility]) 15 g 0  . omeprazole (PRILOSEC) 20 MG capsule Take 20 mg by mouth daily.     . ondansetron (ZOFRAN) 4 MG tablet Take 4 mg by mouth 3 (three) times daily before meals.     . potassium chloride SA (K-DUR) 20 MEQ tablet Take 20 mEq by mouth daily.     . pregabalin (LYRICA) 100 MG capsule     . pregabalin (LYRICA) 75 MG  capsule     . RESTASIS 0.05 % ophthalmic emulsion Place 1 drop into both eyes 2 (two) times daily.     Marland Kitchen rOPINIRole (REQUIP) 3 MG tablet Take 3 mg by mouth at bedtime. (2000)    . senna (GERI-KOT) 8.6 MG tablet Take 2 tablets by mouth daily.     . simvastatin (ZOCOR) 10 MG tablet Take 10 mg by mouth at bedtime.    Manus Gunning BOWEL PREP KIT 17.5-3.13-1.6 GM/177ML SOLN     . triamcinolone cream (KENALOG) 0.1 %     . cephALEXin (KEFLEX) 500 MG capsule Take 500 mg by mouth 3 (three) times daily.    . dicloxacillin (DYNAPEN) 250 MG capsule Take 250 mg by mouth 4 (four) times daily.    . furosemide (LASIX) 80 MG tablet Take 40 mg by mouth daily.     . hydrochlorothiazide (  HYDRODIURIL) 12.5 MG tablet Take 12.5 mg by mouth daily.    . sertraline (ZOLOFT) 25 MG tablet Take 1 tablet (25 mg total) by mouth daily. 30 tablet 1   No current facility-administered medications for this visit.      Musculoskeletal: Strength & Muscle Tone: UTA Gait & Station: Walks with walker Patient leans: N/A  Psychiatric Specialty Exam: Review of Systems  Neurological: Positive for tremors (chronic).  Psychiatric/Behavioral: The patient is nervous/anxious.   All other systems reviewed and are negative.   There were no vitals taken for this visit.There is no height or weight on file to calculate BMI.  General Appearance: UTA  Eye Contact:  UTA  Speech:  Clear and Coherent  Volume:  Normal  Mood:  Anxious  Affect:  UTA  Thought Process:  Goal Directed and Descriptions of Associations: Intact  Orientation:  Full (Time, Place, and Person)  Thought Content: Logical   Suicidal Thoughts:  No  Homicidal Thoughts:  No  Memory:  Immediate;   Fair Recent;   Fair Remote;   Fair  Judgement:  Fair  Insight:  Fair  Psychomotor Activity:  UTA  Concentration:  Concentration: Fair and Attention Span: Fair  Recall:  [employer] of Knowledge: Fair  Language: Fair  Akathisia:  No  Handed:  Right  AIMS (if indicated):  UTA  Assets:  Communication Skills Desire for Improvement Housing Social Support  ADL's:  Intact  Cognition: WNL  Sleep:  Fair   Screenings: PHQ2-9     Clinical Support from 01/04/2018 in [place] Clinical Support from 11/16/2017 in [place] Office Visit from 10/18/2017 in [place] Procedure visit from 10/04/2017 in [place] Office Visit from 09/25/2017 in [facility]  PHQ-2 Total Score  0  0  1  0  0       Assessment and Plan: Pragya is a 68 year old Caucasian female who has a history of bipolar disorder, GAD, insomnia, restless leg syndrome as well as multiple medical problems was evaluated by phone today.  Collateral information was obtained from staff at [facility].  Patient today reports depressive symptoms are stable however continues to struggle with anxiety.  She will benefit from the following medication changes.  She will also benefit from continued psychotherapy sessions.  Plan as noted below.  Plan Bipolar disorder in remission Lamotrigine 150 mg p.o. daily Wellbutrin SR 100 mg p.o. daily-reduced dosage  GAD-unstable Gabapentin 300 mg p.o. twice daily and 600 mg at bedtime Discontinue Paxil for lack of benefit. Start Zoloft 25 mg p.o. daily. Klonopin 0.25 mg p.o. twice daily.  Insomnia-stable We will monitor closely.  Tremors-chronic She will continue to work with neurology  Cognitive problems-chronic She will continue to work with neurology.  Patient advised to contact the clinic to schedule an appointment with therapist and to continue psychotherapy sessions.   Collateral information was obtained from staff at assisted living facility[facility].  Varney Biles was able to verify all of her medications, currently updated.  We  will also send discontinuation order for Paxil to her pharmacy at [facility] pack.  Follow-up in clinic in 4 weeks or sooner if needed.  January 7 at 11 AM   I have spent atleast 15 minutes non  face to face with patient today. More than 50 % of the time was spent for psychoeducation and supportive  psychotherapy and care coordination. This note was generated in part or whole with voice recognition software. Voice recognition is usually quite accurate but there are transcription errors that can and very often do occur. I apologize for any typographical errors that were not detected and corrected.     Ursula Alert, MD 05/08/2019, 11:44 AM

## 2019-05-13 ENCOUNTER — Telehealth: Payer: Self-pay | Admitting: Gastroenterology

## 2019-05-13 NOTE — Telephone Encounter (Signed)
[name] from the [facility] [place] left vm to schedule pt for an endoscopy with Dr. Allen Norris cb [phone]

## 2019-05-22 NOTE — Telephone Encounter (Signed)
Please call [name] at [facility] to schedule her for an EGD. This is a repeat from her previous EGD. Dx: gastric intestinal metaplasia. Pt and staff is aware this needs repeating. Thank you!!

## 2019-05-23 DIAGNOSIS — K3189 Other diseases of stomach and duodenum: Secondary | ICD-10-CM

## 2019-05-23 DIAGNOSIS — K31A0 Gastric intestinal metaplasia, unspecified: Secondary | ICD-10-CM

## 2019-06-04 ENCOUNTER — Encounter (INDEPENDENT_AMBULATORY_CARE_PROVIDER_SITE_OTHER): Payer: Self-pay

## 2019-06-04 ENCOUNTER — Ambulatory Visit (INDEPENDENT_AMBULATORY_CARE_PROVIDER_SITE_OTHER): Payer: Medicare Other | Admitting: Nurse Practitioner

## 2019-06-04 ENCOUNTER — Other Ambulatory Visit: Payer: Self-pay

## 2019-06-04 ENCOUNTER — Encounter (INDEPENDENT_AMBULATORY_CARE_PROVIDER_SITE_OTHER): Payer: Self-pay | Admitting: Nurse Practitioner

## 2019-06-04 VITALS — BP 94/57 | HR 76 | Resp 19 | Ht 63.0 in | Wt 232.0 lb

## 2019-06-04 DIAGNOSIS — M1711 Unilateral primary osteoarthritis, right knee: Secondary | ICD-10-CM

## 2019-06-04 DIAGNOSIS — Z209 Contact with and (suspected) exposure to unspecified communicable disease: Secondary | ICD-10-CM | POA: Insufficient documentation

## 2019-06-04 DIAGNOSIS — I890 Lymphedema, not elsewhere classified: Secondary | ICD-10-CM | POA: Diagnosis not present

## 2019-06-04 DIAGNOSIS — I10 Essential (primary) hypertension: Secondary | ICD-10-CM

## 2019-06-09 ENCOUNTER — Encounter (INDEPENDENT_AMBULATORY_CARE_PROVIDER_SITE_OTHER): Payer: Self-pay | Admitting: Nurse Practitioner

## 2019-06-09 NOTE — Progress Notes (Signed)
SUBJECTIVE:  Patient ID: Stephanie [name], female    DOB: 02/06/1951, 69 y.o.   MRN: [id number] Chief Complaint  Patient presents with  . Follow-up    BLE swelling    HPI  Stephanie [name] is a 69 y.o. female that presents today based on referral from Ms.  Quentin Ore, FNP due to worsening edema of the patient's lower extremities.  The patient has had issues with right lower extremity edema and redness she was treated a number of times with Keflex and dicloxacillin, as well as increasing her diuretics.  Despite all of this the patient continues to have extensive edema and lower extremity pain.  It is noted that the patient does not want to wear her TED hose due to the discomfort that they cause her.  The patient did not endorse elevating her lower extremities as well.  She denies any fever, chills, nausea, vomiting or diarrhea.  The patient is also tearful at the thought of having to have compression of water lower extremities.  Patient also has early signs of weeping.  She currently resides in a assisted living facility.    Past Medical History:  Diagnosis Date  . Adenomatous colon polyp 08/27/2014   Polyps x 3  . Anemia   . Anxiety   . 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 Arthritis   . Asthma   . Bipolar affective disorder ([place])    h/o  . Blood transfusion 2000  . Blood transfusion without reported diagnosis   . Cancer ([place])   . Chronic abdominal pain    abdominal wall pain  . 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 Colon polyp   . COPD (chronic obstructive pulmonary disease) ([place])   . Depression   . Diabetes mellitus without complication ([place])   . Fatty liver 04/09/08   found in abd CT  . GERD (gastroesophageal reflux disease)   . H/O: CVA (cardiovascular accident)    TIA  . H/O: rheumatic fever   . Hepatitis    HEP "B" twenty years ago  . Hyperlipidemia   . Hypertension   . IBS (irritable bowel syndrome)   . Incontinence   . Insomnia   . Lower extremity edema   .  Neuropathy    lower legs D/T DM  . Obstructive sleep apnea    Use C-PAP twice per week  . Platelets decreased ([place])   . RLS (restless legs syndrome)   . Shortness of breath   . Sleep apnea   . Stroke [facility])    TIA- 2002  . Ulcer   . Unspecified disorders of nervous system   . Wears dentures    full upper and lower    Past Surgical History:  Procedure Laterality Date  . ABDOMINAL EXPLORATION SURGERY    . ABDOMINAL HYSTERECTOMY     total  . Axillary artery cannulation via 8-mm Hemashield graft, median sternotomy, extracorporeal circulation with deep hypothermic circulatory arrest, repair of  aortic dessection  01/23/2009   Dr Arlyce Dice  . BACK SURGERY      x 5  . cataract     both eyes  . CHOLECYSTECTOMY    . COLONOSCOPY    . COLONOSCOPY WITH PROPOFOL N/A 02/26/2019   Procedure: COLONOSCOPY WITH PROPOFOL;  Surgeon: Lucilla Lame, MD;  Location: [facility] ENDOSCOPY;  Service: Endoscopy;  Laterality: N/A;  . ESOPHAGOGASTRODUODENOSCOPY (EGD) WITH PROPOFOL  02/26/2019   Procedure: ESOPHAGOGASTRODUODENOSCOPY (EGD) WITH PROPOFOL;  Surgeon: Lucilla Lame,  MD;  Location: [facility] ENDOSCOPY;  Service: Endoscopy;;  . EYE SURGERY Bilateral    Cataract Extraction with IOL  . FACIAL LACERATION REPAIR Left 04/11/2018   Procedure: EARLOBE REPAIR;  Surgeon: Carloyn Manner, MD;  Location: [facility];  Service: ENT;  Laterality: Left;  Diabetic - insulin and oral meds sleep apnea  . HEMORROIDECTOMY     and colon polyp removed  . KNEE ARTHROSCOPY WITH LATERAL RELEASE Right 11/15/2016   Procedure: KNEE ARTHROSCOPY WITH LATERAL RELEASE;  Surgeon: Corky Mull, MD;  Location: [facility] ORS;  Service: Orthopedics;  Laterality: Right;  . LIVER BIOPSY  04/10/2012   Procedure: LIVER BIOPSY;  Surgeon: Inda Castle, MD;  Location: [facility] ENDOSCOPY;  Service: Endoscopy;  Laterality: N/A;  ultrasound to mark order for abd limited/liver to be marked to be sent by [email or web address]   . LUMBAR FUSION  10/09  . LUMBAR WOUND  DEBRIDEMENT  05/09/2011   Procedure: LUMBAR WOUND DEBRIDEMENT;  Surgeon: Dahlia Bailiff;  Location: [facility];  Service: Orthopedics;  Laterality: N/A;  IRRIGATION AND DEBRIDEMENT SPINAL WOUND  . POSTERIOR CERVICAL FUSION/FORAMINOTOMY    . ROTATOR CUFF REPAIR     left    Social History   Socioeconomic History  . Marital status: Divorced    Spouse name: Not on file  . Number of children: 2  . Years of education: Not on file  . Highest education level: 8th grade  Occupational History  . Occupation: Disabled    Employer: [employer]  Tobacco Use  . Smoking status: Former Smoker    Packs/day: 1.50    Years: 50.00    Pack years: 75.00    Types: Cigarettes    Quit date: 04/06/2013    Years since quitting: 6.1  . Smokeless tobacco: Never Used  Substance and Sexual Activity  . Alcohol use: No    Alcohol/week: 0.0 standard drinks  . Drug use: No  . Sexual activity: Never  Other Topics Concern  . Not on file  Social History Narrative   1 son, 81+ y/o   Daily caffeine use: 2 cups daily   Does not get regular exercise   Disability due to bipolar      Has a living will- she is a DNR (she has form at home per pt).   Social Determinants of Health   Financial Resource Strain:   . Difficulty of Paying Living Expenses: Not on file  Food Insecurity:   . Worried About Charity fundraiser in the Last Year: Not on file  . Ran Out of Food in the Last Year: Not on file  Transportation Needs:   . Lack of Transportation (Medical): Not on file  . Lack of Transportation (Non-Medical): Not on file  Physical Activity:   . Days of Exercise per Week: Not on file  . Minutes of Exercise per Session: Not on file  Stress:   . Feeling of Stress : Not on file  Social Connections:   . Frequency of Communication with Friends and Family: Not on file  . Frequency of Social Gatherings with Friends and Family: Not on file  . Attends Religious Services: Not on file  . Active Member of Clubs or  Organizations: Not on file  . Attends Archivist Meetings: Not on file  . Marital Status: Not on file  Intimate Partner Violence:   . Fear of Current or Ex-Partner: Not on file  . Emotionally Abused: Not on file  . Physically Abused: Not on file  .  Sexually Abused: Not on file    Family History  Problem Relation Age of Onset  . Breast cancer Mother   . Lung cancer Mother   . Stomach cancer Father   . Diabetes Other        4 aunts, and 1 uncle  . Depression Brother   . Anxiety disorder Brother   . Colon cancer Neg Hx   . Esophageal cancer Neg Hx   . Rectal cancer Neg Hx     Allergies  Allergen Reactions  . Cortisone     Increased BS  . Doxycycline Hives, Swelling, Other (See Comments) and Nausea Only    Reaction:  Facial swelling  Face red and swollen; no difficulty breathing  . Morphine Other (See Comments)     Review of Systems   Review of Systems: Negative Unless Checked Constitutional: _0 Weight loss  _1 Fever  _2 Chills Cardiac: _3 Chest pain   _4  Atrial Fibrillation  _5 Palpitations   _6 Shortness of breath when laying flat   _7 Shortness of breath with exertion. _8 Shortness of breath at rest Vascular:  _9 Pain in legs with walking   _10 Pain in legs with standing _11 Pain in legs when laying flat   _12 Claudication    _13 Pain in feet when laying flat    _14 History of DVT   _15 Phlebitis   _16 Swelling in legs   _17 Varicose veins   _18 Non-healing ulcers Pulmonary:   _19 Uses home oxygen   _20 Productive cough   _21 Hemoptysis   _22 Wheeze  _23 COPD   _24 Asthma Neurologic:  _25 Dizziness   _26 Seizures  _27 Blackouts _28 History of stroke   _29 History of TIA  _30 Aphasia   _31 Temporary Blindness   _32 Weakness or numbness in arm   _33 Weakness or numbness in leg Musculoskeletal:   _34 Joint swelling   _35 Joint pain   _36 Low back pain  _37  History of Knee Replacement _38 Arthritis _39 back Surgeries  _40  Spinal Stenosis    Hematologic:  _41 Easy bruising  _42 Easy bleeding   _43 Hypercoagulable state    _44 Anemic Gastrointestinal:  _45 Diarrhea   _46 Vomiting  _47 Gastroesophageal reflux/heartburn   _48 Difficulty swallowing. _49 Abdominal pain Genitourinary:  _50 Chronic kidney disease   _51 Difficult urination  _52 Anuric   _53 Blood in urine _54 Frequent urination  _55 Burning with urination   _56 Hematuria Skin:  _57 Rashes   _58 Ulcers _59 Wounds Psychological:  _60 History of anxiety   _61  History of major depression  _62  Memory Difficulties      OBJECTIVE:   Physical Exam  BP (!) 94/57 (BP Location: Right Arm)   Pulse 76   Resp 19   Ht _63  (1.6 m)   Wt 232 lb (105.2 kg)   BMI 41.10 kg/m   Gen: WD/WN, NAD Head: [place]/AT, No temporalis wasting.  Ear/Nose/Throat: Hearing grossly intact, nares w/o erythema or drainage Eyes: PER, EOMI, sclera nonicteric.  Neck: Supple, no masses.  No JVD.  Pulmonary:  Good air movement, no use of accessory muscles.  Cardiac: RRR Vascular:  4+ pitting edema bilaterally.  Stasis dermatitis bilaterally.  Unable to palpate pedal pulses due to extensive edema Vessel Right Left  Radial Palpable Palpable   Gastrointestinal: soft, non-distended. No guarding/no peritoneal signs.  Musculoskeletal: M/S 5/5 throughout.  No deformity or atrophy.  Neurologic: Pain and light touch intact in extremities.  Symmetrical.  Speech is fluent. Motor exam as listed above. Psychiatric: Judgment intact, Mood & affect appropriate for pt's clinical situation. Dermatologic: No Venous rashes. No Ulcers Noted.  No changes consistent with cellulitis. Lymph : No Cervical lymphadenopathy, bilateral dermal thickening       ASSESSMENT AND PLAN:  1. Lymphedema Today the patient does not have any evidence of cellulitis in the right lower extremity however she does have extensive edema bilaterally.  At this point the patient needs compression in order to alleviate her edema as well as her pain and discomfort.  The patient has extensive third spacing therefore her current diuretics will not be very effective  again without compression.  This was stressed to the patient today.  Advised the patient that while this may be uncomfortable at first if she could tolerate it for several days bilateral Unna wraps would help her legs to feel better in the long run as well as to decrease edema.  The patient was agreeable to this.  Instructions were also sent to the patient's facility to perform Unna wraps on a weekly basis.  We will have the patient return to the office in 4 weeks to evaluate the status of the lower extremity edema.  2. Unilateral primary osteoarthritis, right knee Continue NSAID medications as already ordered, these medications have been reviewed and there are no changes at this time.  Continued activity and therapy was stressed.   3. Essential hypertension Continue antihypertensive medications as already ordered, these medications have been reviewed and there are no changes at this time.    Current Outpatient Medications on File Prior to Visit  Medication Sig Dispense Refill  . albuterol (PROVENTIL) (2.5 MG/3ML) 0.083% nebulizer solution Take 2.5 mg by nebulization every 6 (six) hours as needed for wheezing or shortness of breath.    Marland Kitchen albuterol (VENTOLIN HFA) 108 (90 Base) MCG/ACT inhaler Inhale 2 puffs into the lungs every 6 (six) hours as needed for wheezing or shortness of breath. 6.7 g 5  . buPROPion (WELLBUTRIN SR) 100 MG 12 hr tablet Take 1 tablet (100 mg total) by mouth daily after breakfast. 90 tablet 0  . cephALEXin (KEFLEX) 500 MG capsule Take 500 mg by mouth 3 (three) times daily.    . clonazePAM (KLONOPIN) 0.25 MG disintegrating tablet Take 0.25 mg by mouth 2 (two) times daily.     . diclofenac sodium (VOLTAREN) 1 % GEL Apply topically 3 (three) times daily.    . dicloxacillin (DYNAPEN) 250 MG capsule Take 250 mg by mouth 4 (four) times daily.    Marland Kitchen dicyclomine (BENTYL) 10 MG capsule Take 10 mg by mouth 3 (three) times daily.     Marland Kitchen donepezil (ARICEPT) 10 MG tablet Take 10 mg by  mouth at bedtime.     . ferrous sulfate 325 (65 FE) MG tablet Take 325 mg by mouth 2 (two) times daily.     . furosemide (LASIX) 80 MG tablet Take 40 mg by mouth daily.     Marland Kitchen gabapentin (NEURONTIN) 300 MG capsule Take 1 capsule (300 mg total) by mouth 3 (three) times daily. Takes 300 mg at 8 am , 2pm, 300 mg 8 pm. (Patient taking differently: Take 300 mg by mouth 3 (three) times daily. Takes 300 mg at 8 am , 2pm, Taking 600 mg 8 pm.) 270 capsule 1  . gabapentin (NEURONTIN) 600 MG tablet     . hydrochlorothiazide (HYDRODIURIL) 12.5 MG tablet Take 12.5 mg by mouth daily.    . hydrochlorothiazide (HYDRODIURIL) 25 MG tablet Take 25 mg by mouth daily.    Marland Kitchen HYDROcodone-acetaminophen (NORCO) 7.5-325 MG tablet Take 1 tablet by mouth every 6 (six) hours as needed for moderate pain.    . hydrOXYzine (ATARAX/VISTARIL) 10 MG tablet Take 10 mg by mouth 2 (two) times daily.    Marland Kitchen  insulin glargine (LANTUS) 100 UNIT/ML injection Inject 42 Units into the skin every evening.     . insulin lispro (HUMALOG) 100 UNIT/ML injection Inject 8 Units into the skin 3 (three) times daily before meals.     . lactulose (CHRONULAC) 10 GM/15ML solution Take 10 g by mouth daily. (0800)    . lamoTRIgine (LAMICTAL) 150 MG tablet Take 1 tablet (150 mg total) by mouth 2 (two) times daily. 180 tablet 0  . lidocaine (LIDODERM) 5 % Place 1 patch onto the skin daily.     Marland Kitchen liraglutide (VICTOZA) 18 MG/3ML SOPN Inject 1.8 mg into the skin daily.    Marland Kitchen loperamide (IMODIUM) 2 MG capsule Take 4 mg by mouth as needed for diarrhea or loose stools.    . metoCLOPramide (REGLAN) 5 MG tablet Take 5 mg by mouth 3 (three) times daily before meals. (0800, 1200, 1600 & 2000)     . nystatin (MYCOSTATIN/NYSTOP) powder Apply topically 4 (four) times daily. (Patient taking differently: Apply topically 2 (two) times daily. Apply topically to the are under abdomen 2 times a day x 4 days Per Nursing Home MAR) 15 g 0  . omeprazole (PRILOSEC) 20 MG capsule Take 20  mg by mouth daily.     . ondansetron (ZOFRAN) 4 MG tablet Take 4 mg by mouth 3 (three) times daily before meals.     . potassium chloride SA (K-DUR) 20 MEQ tablet Take 20 mEq by mouth daily.     . pregabalin (LYRICA) 100 MG capsule     . pregabalin (LYRICA) 75 MG capsule     . RESTASIS 0.05 % ophthalmic emulsion Place 1 drop into both eyes 2 (two) times daily.     Marland Kitchen rOPINIRole (REQUIP) 3 MG tablet Take 3 mg by mouth at bedtime. (2000)    . senna (GERI-KOT) 8.6 MG tablet Take 2 tablets by mouth daily.     . sertraline (ZOLOFT) 25 MG tablet Take 1 tablet (25 mg total) by mouth daily. 30 tablet 1  . simvastatin (ZOCOR) 10 MG tablet Take 10 mg by mouth at bedtime.    Manus Gunning BOWEL PREP KIT 17.5-3.13-1.6 GM/177ML SOLN     . triamcinolone cream (KENALOG) 0.1 %      No current facility-administered medications on file prior to visit.    There are no Patient Instructions on file for this visit. No follow-ups on file.   Kris Hartmann, NP  This note was completed with Sales executive.  Any errors are purely unintentional.

## 2019-06-11 ENCOUNTER — Emergency Department: Payer: Medicare Other

## 2019-06-11 ENCOUNTER — Inpatient Hospital Stay
Admission: EM | Admit: 2019-06-11 | Discharge: 2019-06-15 | DRG: 208 | Disposition: A | Discharge status: Other Acute Inpatient Hospital | Payer: Medicare Other | Source: Skilled Nursing Facility | Attending: Internal Medicine | Admitting: Internal Medicine

## 2019-06-11 ENCOUNTER — Encounter: Payer: Self-pay | Admitting: Internal Medicine

## 2019-06-11 ENCOUNTER — Other Ambulatory Visit: Payer: Self-pay

## 2019-06-11 DIAGNOSIS — R6521 Severe sepsis with septic shock: Secondary | ICD-10-CM | POA: Diagnosis not present

## 2019-06-11 DIAGNOSIS — Z86010 Personal history of colonic polyps: Secondary | ICD-10-CM

## 2019-06-11 DIAGNOSIS — E1142 Type 2 diabetes mellitus with diabetic polyneuropathy: Secondary | ICD-10-CM | POA: Diagnosis present

## 2019-06-11 DIAGNOSIS — J80 Acute respiratory distress syndrome: Secondary | ICD-10-CM | POA: Diagnosis present

## 2019-06-11 DIAGNOSIS — M545 Low back pain: Secondary | ICD-10-CM | POA: Diagnosis present

## 2019-06-11 DIAGNOSIS — K259 Gastric ulcer, unspecified as acute or chronic, without hemorrhage or perforation: Secondary | ICD-10-CM | POA: Diagnosis present

## 2019-06-11 DIAGNOSIS — Z818 Family history of other mental and behavioral disorders: Secondary | ICD-10-CM

## 2019-06-11 DIAGNOSIS — R197 Diarrhea, unspecified: Secondary | ICD-10-CM | POA: Diagnosis present

## 2019-06-11 DIAGNOSIS — Z794 Long term (current) use of insulin: Secondary | ICD-10-CM

## 2019-06-11 DIAGNOSIS — Z961 Presence of intraocular lens: Secondary | ICD-10-CM | POA: Diagnosis present

## 2019-06-11 DIAGNOSIS — Z8673 Personal history of transient ischemic attack (TIA), and cerebral infarction without residual deficits: Secondary | ICD-10-CM

## 2019-06-11 DIAGNOSIS — I5032 Chronic diastolic (congestive) heart failure: Secondary | ICD-10-CM | POA: Diagnosis present

## 2019-06-11 DIAGNOSIS — K7581 Nonalcoholic steatohepatitis (NASH): Secondary | ICD-10-CM | POA: Diagnosis present

## 2019-06-11 DIAGNOSIS — E119 Type 2 diabetes mellitus without complications: Secondary | ICD-10-CM

## 2019-06-11 DIAGNOSIS — F3130 Bipolar disorder, current episode depressed, mild or moderate severity, unspecified: Secondary | ICD-10-CM | POA: Diagnosis present

## 2019-06-11 DIAGNOSIS — M4802 Spinal stenosis, cervical region: Secondary | ICD-10-CM | POA: Diagnosis present

## 2019-06-11 DIAGNOSIS — Z881 Allergy status to other antibiotic agents status: Secondary | ICD-10-CM

## 2019-06-11 DIAGNOSIS — G4733 Obstructive sleep apnea (adult) (pediatric): Secondary | ICD-10-CM | POA: Diagnosis present

## 2019-06-11 DIAGNOSIS — Z90710 Acquired absence of both cervix and uterus: Secondary | ICD-10-CM

## 2019-06-11 DIAGNOSIS — E785 Hyperlipidemia, unspecified: Secondary | ICD-10-CM | POA: Diagnosis present

## 2019-06-11 DIAGNOSIS — Z981 Arthrodesis status: Secondary | ICD-10-CM

## 2019-06-11 DIAGNOSIS — K581 Irritable bowel syndrome with constipation: Secondary | ICD-10-CM | POA: Diagnosis present

## 2019-06-11 DIAGNOSIS — E870 Hyperosmolality and hypernatremia: Secondary | ICD-10-CM | POA: Diagnosis not present

## 2019-06-11 DIAGNOSIS — J440 Chronic obstructive pulmonary disease with acute lower respiratory infection: Secondary | ICD-10-CM | POA: Diagnosis present

## 2019-06-11 DIAGNOSIS — J069 Acute upper respiratory infection, unspecified: Secondary | ICD-10-CM | POA: Diagnosis not present

## 2019-06-11 DIAGNOSIS — M1711 Unilateral primary osteoarthritis, right knee: Secondary | ICD-10-CM | POA: Diagnosis present

## 2019-06-11 DIAGNOSIS — Z833 Family history of diabetes mellitus: Secondary | ICD-10-CM

## 2019-06-11 DIAGNOSIS — Z6839 Body mass index (BMI) 39.0-39.9, adult: Secondary | ICD-10-CM

## 2019-06-11 DIAGNOSIS — K7460 Unspecified cirrhosis of liver: Secondary | ICD-10-CM | POA: Diagnosis present

## 2019-06-11 DIAGNOSIS — Z87891 Personal history of nicotine dependence: Secondary | ICD-10-CM

## 2019-06-11 DIAGNOSIS — J449 Chronic obstructive pulmonary disease, unspecified: Secondary | ICD-10-CM | POA: Diagnosis not present

## 2019-06-11 DIAGNOSIS — J1282 Pneumonia due to coronavirus disease 2019: Secondary | ICD-10-CM | POA: Diagnosis present

## 2019-06-11 DIAGNOSIS — A4189 Other specified sepsis: Secondary | ICD-10-CM | POA: Diagnosis not present

## 2019-06-11 DIAGNOSIS — I340 Nonrheumatic mitral (valve) insufficiency: Secondary | ICD-10-CM | POA: Diagnosis present

## 2019-06-11 DIAGNOSIS — Z888 Allergy status to other drugs, medicaments and biological substances status: Secondary | ICD-10-CM

## 2019-06-11 DIAGNOSIS — Z9841 Cataract extraction status, right eye: Secondary | ICD-10-CM

## 2019-06-11 DIAGNOSIS — R739 Hyperglycemia, unspecified: Secondary | ICD-10-CM | POA: Diagnosis not present

## 2019-06-11 DIAGNOSIS — D6959 Other secondary thrombocytopenia: Secondary | ICD-10-CM | POA: Diagnosis present

## 2019-06-11 DIAGNOSIS — Z9842 Cataract extraction status, left eye: Secondary | ICD-10-CM

## 2019-06-11 DIAGNOSIS — G2581 Restless legs syndrome: Secondary | ICD-10-CM | POA: Diagnosis present

## 2019-06-11 DIAGNOSIS — E1165 Type 2 diabetes mellitus with hyperglycemia: Secondary | ICD-10-CM | POA: Diagnosis not present

## 2019-06-11 DIAGNOSIS — Z8619 Personal history of other infectious and parasitic diseases: Secondary | ICD-10-CM

## 2019-06-11 DIAGNOSIS — Z801 Family history of malignant neoplasm of trachea, bronchus and lung: Secondary | ICD-10-CM

## 2019-06-11 DIAGNOSIS — R0902 Hypoxemia: Secondary | ICD-10-CM | POA: Diagnosis present

## 2019-06-11 DIAGNOSIS — I10 Essential (primary) hypertension: Secondary | ICD-10-CM | POA: Diagnosis present

## 2019-06-11 DIAGNOSIS — D696 Thrombocytopenia, unspecified: Secondary | ICD-10-CM

## 2019-06-11 DIAGNOSIS — D509 Iron deficiency anemia, unspecified: Secondary | ICD-10-CM | POA: Diagnosis present

## 2019-06-11 DIAGNOSIS — I110 Hypertensive heart disease with heart failure: Secondary | ICD-10-CM | POA: Diagnosis present

## 2019-06-11 DIAGNOSIS — K219 Gastro-esophageal reflux disease without esophagitis: Secondary | ICD-10-CM | POA: Diagnosis present

## 2019-06-11 DIAGNOSIS — F411 Generalized anxiety disorder: Secondary | ICD-10-CM | POA: Diagnosis present

## 2019-06-11 DIAGNOSIS — J15211 Pneumonia due to Methicillin susceptible Staphylococcus aureus: Secondary | ICD-10-CM | POA: Diagnosis not present

## 2019-06-11 DIAGNOSIS — N179 Acute kidney failure, unspecified: Secondary | ICD-10-CM | POA: Diagnosis not present

## 2019-06-11 DIAGNOSIS — Z803 Family history of malignant neoplasm of breast: Secondary | ICD-10-CM

## 2019-06-11 DIAGNOSIS — Z79899 Other long term (current) drug therapy: Secondary | ICD-10-CM

## 2019-06-11 DIAGNOSIS — R571 Hypovolemic shock: Secondary | ICD-10-CM | POA: Diagnosis not present

## 2019-06-11 DIAGNOSIS — U071 COVID-19: Principal | ICD-10-CM | POA: Diagnosis present

## 2019-06-11 DIAGNOSIS — Z800 Family history of malignant neoplasm of digestive organs: Secondary | ICD-10-CM

## 2019-06-11 DIAGNOSIS — B49 Unspecified mycosis: Secondary | ICD-10-CM

## 2019-06-11 DIAGNOSIS — Z9049 Acquired absence of other specified parts of digestive tract: Secondary | ICD-10-CM

## 2019-06-11 DIAGNOSIS — Z79891 Long term (current) use of opiate analgesic: Secondary | ICD-10-CM

## 2019-06-11 DIAGNOSIS — Z01818 Encounter for other preprocedural examination: Secondary | ICD-10-CM

## 2019-06-11 DIAGNOSIS — Z885 Allergy status to narcotic agent status: Secondary | ICD-10-CM

## 2019-06-11 DIAGNOSIS — G894 Chronic pain syndrome: Secondary | ICD-10-CM | POA: Diagnosis present

## 2019-06-11 DIAGNOSIS — E669 Obesity, unspecified: Secondary | ICD-10-CM | POA: Diagnosis present

## 2019-06-11 DIAGNOSIS — Z791 Long term (current) use of non-steroidal anti-inflammatories (NSAID): Secondary | ICD-10-CM

## 2019-06-11 LAB — COMPREHENSIVE METABOLIC PANEL WITH GFR
ALT: 16 U/L (ref 0–44)
AST: 43 U/L — ABNORMAL HIGH (ref 15–41)
Albumin: 3.2 g/dL — ABNORMAL LOW (ref 3.5–5.0)
Alkaline Phosphatase: 126 U/L (ref 38–126)
Anion gap: 10 (ref 5–15)
BUN: 16 mg/dL (ref 8–23)
CO2: 25 mmol/L (ref 22–32)
Calcium: 8.1 mg/dL — ABNORMAL LOW (ref 8.9–10.3)
Chloride: 97 mmol/L — ABNORMAL LOW (ref 98–111)
Creatinine, Ser: 1.08 mg/dL — ABNORMAL HIGH (ref 0.44–1.00)
GFR calc Af Amer: >60 mL/min
GFR calc non Af Amer: 53 mL/min — ABNORMAL LOW
Glucose, Bld: 162 mg/dL — ABNORMAL HIGH (ref 70–99)
Potassium: 3.7 mmol/L (ref 3.5–5.1)
Sodium: 132 mmol/L — ABNORMAL LOW (ref 135–145)
Total Bilirubin: 0.8 mg/dL (ref 0.3–1.2)
Total Protein: 6.2 g/dL — ABNORMAL LOW (ref 6.5–8.1)

## 2019-06-11 LAB — CBC WITH DIFFERENTIAL/PLATELET
Abs Immature Granulocytes: 0.01 K/uL (ref 0.00–0.07)
Basophils Absolute: 0 K/uL (ref 0.0–0.1)
Basophils Relative: 1 %
Eosinophils Absolute: 0 K/uL (ref 0.0–0.5)
Eosinophils Relative: 0 %
HCT: 31.9 % — ABNORMAL LOW (ref 36.0–46.0)
Hemoglobin: 10.8 g/dL — ABNORMAL LOW (ref 12.0–15.0)
Immature Granulocytes: 1 %
Lymphocytes Relative: 12 %
Lymphs Abs: 0.3 K/uL — ABNORMAL LOW (ref 0.7–4.0)
MCH: 30.6 pg (ref 26.0–34.0)
MCHC: 33.9 g/dL (ref 30.0–36.0)
MCV: 90.4 fL (ref 80.0–100.0)
Monocytes Absolute: 0.3 K/uL (ref 0.1–1.0)
Monocytes Relative: 15 %
Neutro Abs: 1.5 K/uL — ABNORMAL LOW (ref 1.7–7.7)
Neutrophils Relative %: 71 %
Platelets: 80 K/uL — ABNORMAL LOW (ref 150–400)
RBC: 3.53 MIL/uL — ABNORMAL LOW (ref 3.87–5.11)
RDW: 15.1 % (ref 11.5–15.5)
WBC: 2.1 K/uL — ABNORMAL LOW (ref 4.0–10.5)
nRBC: 0 % (ref 0.0–0.2)

## 2019-06-11 LAB — D-DIMER, QUANTITATIVE: D-Dimer, Quant: 0.97 ug{FEU}/mL — ABNORMAL HIGH (ref 0.00–0.50)

## 2019-06-11 LAB — TROPONIN I (HIGH SENSITIVITY)
Troponin I (High Sensitivity): 5 ng/L (ref <18)
Troponin I (High Sensitivity): 5 ng/L (ref <18)

## 2019-06-11 LAB — GLUCOSE, CAPILLARY
Glucose-Capillary: 106 mg/dL — ABNORMAL HIGH (ref 70–99)
Glucose-Capillary: 111 mg/dL — ABNORMAL HIGH (ref 70–99)
Glucose-Capillary: 140 mg/dL — ABNORMAL HIGH (ref 70–99)

## 2019-06-11 LAB — HIV ANTIBODY (ROUTINE TESTING W REFLEX): HIV Screen 4th Generation wRfx: NONREACTIVE

## 2019-06-11 LAB — TRIGLYCERIDES: Triglycerides: 100 mg/dL

## 2019-06-11 LAB — FERRITIN: Ferritin: 498 ng/mL — ABNORMAL HIGH (ref 11–307)

## 2019-06-11 LAB — FIBRINOGEN: Fibrinogen: 462 mg/dL (ref 210–475)

## 2019-06-11 LAB — LACTIC ACID, PLASMA: Lactic Acid, Venous: 1.4 mmol/L (ref 0.5–1.9)

## 2019-06-11 LAB — HEPATITIS B SURFACE ANTIGEN: Hepatitis B Surface Ag: NONREACTIVE

## 2019-06-11 LAB — LACTATE DEHYDROGENASE: LDH: 297 U/L — ABNORMAL HIGH (ref 98–192)

## 2019-06-11 LAB — AMMONIA: Ammonia: 13 umol/L (ref 9–35)

## 2019-06-11 LAB — BRAIN NATRIURETIC PEPTIDE: B Natriuretic Peptide: 173 pg/mL — ABNORMAL HIGH (ref 0.0–100.0)

## 2019-06-11 LAB — C-REACTIVE PROTEIN: CRP: 9.9 mg/dL — ABNORMAL HIGH (ref <1.0)

## 2019-06-11 LAB — PROCALCITONIN: Procalcitonin: <0.1 ng/mL

## 2019-06-11 MED ORDER — LIDOCAINE 5 % EX PTCH
1.0000 | MEDICATED_PATCH | CUTANEOUS | Status: DC
  Administered 2019-06-11 – 2019-06-13 (×3): 1 via TRANSDERMAL
  Filled 2019-06-11 (×4): qty 1

## 2019-06-11 MED ORDER — HYDROXYZINE HCL 10 MG PO TABS
10.0000 mg | ORAL_TABLET | Freq: Three times a day (TID) | ORAL | Status: DC | PRN
  Filled 2019-06-11: qty 1

## 2019-06-11 MED ORDER — PANTOPRAZOLE SODIUM 40 MG PO TBEC
40.0000 mg | DELAYED_RELEASE_TABLET | Freq: Every day | ORAL | Status: DC
  Administered 2019-06-11 – 2019-06-14 (×4): 40 mg via ORAL
  Filled 2019-06-11 (×4): qty 1

## 2019-06-11 MED ORDER — HYDROCODONE-ACETAMINOPHEN 7.5-325 MG PO TABS
1.0000 | ORAL_TABLET | Freq: Every day | ORAL | Status: DC
  Administered 2019-06-11 – 2019-06-12 (×2): 1 via ORAL
  Filled 2019-06-11 (×2): qty 1

## 2019-06-11 MED ORDER — DICYCLOMINE HCL 10 MG PO CAPS
10.0000 mg | ORAL_CAPSULE | Freq: Three times a day (TID) | ORAL | Status: DC
  Administered 2019-06-11 – 2019-06-14 (×8): 10 mg via ORAL
  Filled 2019-06-11 (×11): qty 1

## 2019-06-11 MED ORDER — HYDROCORTISONE 2.5 % EX CREA
1.0000 "application " | TOPICAL_CREAM | Freq: Every day | CUTANEOUS | Status: DC
  Administered 2019-06-12 – 2019-06-14 (×3): 1 via TOPICAL
  Filled 2019-06-11: qty 20

## 2019-06-11 MED ORDER — LAMOTRIGINE 25 MG PO TABS
150.0000 mg | ORAL_TABLET | Freq: Two times a day (BID) | ORAL | Status: DC
  Administered 2019-06-11 – 2019-06-14 (×6): 150 mg via ORAL
  Filled 2019-06-11: qty 2
  Filled 2019-06-11: qty 6
  Filled 2019-06-11: qty 2
  Filled 2019-06-11 (×3): qty 6

## 2019-06-11 MED ORDER — LACTULOSE 10 GM/15ML PO SOLN
10.0000 g | Freq: Every day | ORAL | Status: DC
  Administered 2019-06-12: 10 g via ORAL
  Filled 2019-06-11 (×4): qty 30

## 2019-06-11 MED ORDER — ACETAMINOPHEN 325 MG PO TABS
650.0000 mg | ORAL_TABLET | Freq: Four times a day (QID) | ORAL | Status: DC | PRN
  Administered 2019-06-13: 650 mg via ORAL
  Filled 2019-06-11: qty 2

## 2019-06-11 MED ORDER — IPRATROPIUM BROMIDE HFA 17 MCG/ACT IN AERS
2.0000 | INHALATION_SPRAY | RESPIRATORY_TRACT | Status: DC
  Administered 2019-06-11 – 2019-06-14 (×17): 2 via RESPIRATORY_TRACT
  Filled 2019-06-11: qty 12.9

## 2019-06-11 MED ORDER — PREGABALIN 75 MG PO CAPS: 75.0000 mg | ORAL_CAPSULE | Freq: Two times a day (BID) | ORAL | Status: DC

## 2019-06-11 MED ORDER — SIMVASTATIN 20 MG PO TABS
10.0000 mg | ORAL_TABLET | Freq: Every day | ORAL | Status: DC
  Administered 2019-06-11 – 2019-06-13 (×3): 10 mg via ORAL
  Filled 2019-06-11 (×3): qty 1

## 2019-06-11 MED ORDER — DICLOFENAC SODIUM 1 % TD GEL
4.0000 g | Freq: Three times a day (TID) | TRANSDERMAL | Status: DC
  Administered 2019-06-11 – 2019-06-13 (×7): 4 g via TOPICAL
  Filled 2019-06-11: qty 50

## 2019-06-11 MED ORDER — GABAPENTIN 300 MG PO CAPS
600.0000 mg | ORAL_CAPSULE | ORAL | Status: DC
  Administered 2019-06-12 – 2019-06-13 (×2): 600 mg via ORAL
  Filled 2019-06-11 (×2): qty 2

## 2019-06-11 MED ORDER — CLONAZEPAM 0.25 MG PO TBDP
0.2500 mg | ORAL_TABLET | Freq: Two times a day (BID) | ORAL | Status: DC
  Administered 2019-06-11: 0.25 mg via ORAL
  Filled 2019-06-11: qty 1

## 2019-06-11 MED ORDER — DONEPEZIL HCL 5 MG PO TABS
10.0000 mg | ORAL_TABLET | Freq: Every day | ORAL | Status: DC
  Administered 2019-06-11 – 2019-06-13 (×3): 10 mg via ORAL
  Filled 2019-06-11 (×5): qty 2

## 2019-06-11 MED ORDER — LOPERAMIDE HCL 2 MG PO CAPS: 4.0000 mg | ORAL_CAPSULE | ORAL | Status: DC | PRN

## 2019-06-11 MED ORDER — GABAPENTIN 300 MG PO CAPS
300.0000 mg | ORAL_CAPSULE | ORAL | Status: DC
  Administered 2019-06-12 – 2019-06-14 (×6): 300 mg via ORAL
  Filled 2019-06-11 (×5): qty 1

## 2019-06-11 MED ORDER — PREGABALIN 75 MG PO CAPS
75.0000 mg | ORAL_CAPSULE | ORAL | Status: DC
  Administered 2019-06-12 – 2019-06-14 (×6): 75 mg via ORAL
  Filled 2019-06-11 (×5): qty 1

## 2019-06-11 MED ORDER — GABAPENTIN 300 MG PO CAPS: 300.0000 mg | ORAL_CAPSULE | Freq: Three times a day (TID) | ORAL | Status: DC

## 2019-06-11 MED ORDER — DM-GUAIFENESIN ER 30-600 MG PO TB12
1.0000 | ORAL_TABLET | Freq: Two times a day (BID) | ORAL | Status: DC
  Administered 2019-06-11 – 2019-06-14 (×6): 1 via ORAL
  Filled 2019-06-11 (×9): qty 1

## 2019-06-11 MED ORDER — SODIUM CHLORIDE 0.9 % IV SOLN
200.0000 mg | Freq: Once | INTRAVENOUS | Status: AC
  Administered 2019-06-11: 200 mg via INTRAVENOUS
  Filled 2019-06-11: qty 200

## 2019-06-11 MED ORDER — ALBUTEROL SULFATE HFA 108 (90 BASE) MCG/ACT IN AERS
2.0000 | INHALATION_SPRAY | RESPIRATORY_TRACT | Status: DC | PRN
  Filled 2019-06-11: qty 6.7

## 2019-06-11 MED ORDER — FERROUS SULFATE 325 (65 FE) MG PO TABS
325.0000 mg | ORAL_TABLET | Freq: Two times a day (BID) | ORAL | Status: DC
  Administered 2019-06-11 – 2019-06-14 (×6): 325 mg via ORAL
  Filled 2019-06-11 (×6): qty 1

## 2019-06-11 MED ORDER — CYCLOSPORINE 0.05 % OP EMUL
1.0000 [drp] | Freq: Two times a day (BID) | OPHTHALMIC | Status: DC | PRN
  Filled 2019-06-11: qty 30

## 2019-06-11 MED ORDER — SODIUM CHLORIDE 0.9 % IV SOLN
100.0000 mg | Freq: Every day | INTRAVENOUS | Status: DC
  Administered 2019-06-12 – 2019-06-14 (×3): 100 mg via INTRAVENOUS
  Filled 2019-06-11 (×3): qty 100
  Filled 2019-06-11: qty 20

## 2019-06-11 MED ORDER — PREGABALIN 50 MG PO CAPS
100.0000 mg | ORAL_CAPSULE | Freq: Every day | ORAL | Status: DC
  Administered 2019-06-11 – 2019-06-13 (×3): 100 mg via ORAL
  Filled 2019-06-11 (×3): qty 2

## 2019-06-11 MED ORDER — NYSTATIN 100000 UNIT/GM EX POWD
Freq: Four times a day (QID) | CUTANEOUS | Status: DC
  Filled 2019-06-11: qty 15

## 2019-06-11 MED ORDER — INSULIN ASPART 100 UNIT/ML ~~LOC~~ SOLN
0.0000 [IU] | SUBCUTANEOUS | Status: DC
  Administered 2019-06-11: 1 [IU] via SUBCUTANEOUS
  Administered 2019-06-12: 2 [IU] via SUBCUTANEOUS
  Administered 2019-06-12: 05:00:00 1 [IU] via SUBCUTANEOUS
  Administered 2019-06-12 (×2): 2 [IU] via SUBCUTANEOUS
  Administered 2019-06-13: 3 [IU] via SUBCUTANEOUS
  Administered 2019-06-13 (×5): 2 [IU] via SUBCUTANEOUS
  Administered 2019-06-14: 5 [IU] via SUBCUTANEOUS
  Administered 2019-06-14: 2 [IU] via SUBCUTANEOUS
  Administered 2019-06-14: 3 [IU] via SUBCUTANEOUS
  Administered 2019-06-14 (×2): 2 [IU] via SUBCUTANEOUS
  Administered 2019-06-15: 5 [IU] via SUBCUTANEOUS
  Administered 2019-06-15: 3 [IU] via SUBCUTANEOUS
  Filled 2019-06-11 (×18): qty 1

## 2019-06-11 MED ORDER — INSULIN GLARGINE 100 UNIT/ML ~~LOC~~ SOLN
15.0000 [IU] | Freq: Every evening | SUBCUTANEOUS | Status: DC
  Administered 2019-06-12 – 2019-06-13 (×2): 15 [IU] via SUBCUTANEOUS
  Filled 2019-06-11 (×4): qty 0.15

## 2019-06-11 MED ORDER — BUPROPION HCL ER (SR) 100 MG PO TB12
100.0000 mg | ORAL_TABLET | Freq: Every day | ORAL | Status: DC
  Administered 2019-06-12 – 2019-06-14 (×3): 100 mg via ORAL
  Filled 2019-06-11 (×3): qty 1

## 2019-06-11 MED ORDER — ROPINIROLE HCL 1 MG PO TABS
3.0000 mg | ORAL_TABLET | ORAL | Status: DC
  Administered 2019-06-12 – 2019-06-13 (×2): 3 mg via ORAL
  Filled 2019-06-11 (×3): qty 3

## 2019-06-11 MED ORDER — SERTRALINE HCL 50 MG PO TABS
25.0000 mg | ORAL_TABLET | Freq: Every day | ORAL | Status: DC
  Administered 2019-06-11 – 2019-06-14 (×4): 25 mg via ORAL
  Filled 2019-06-11 (×4): qty 1

## 2019-06-11 MED ORDER — ONDANSETRON HCL 4 MG/2ML IJ SOLN: 4.0000 mg | Freq: Three times a day (TID) | INTRAMUSCULAR | Status: DC | PRN

## 2019-06-11 MED ORDER — SODIUM CHLORIDE 0.9 % IV BOLUS
1000.0000 mL | Freq: Once | INTRAVENOUS | Status: AC
  Administered 2019-06-11: 1000 mL via INTRAVENOUS

## 2019-06-11 NOTE — ED Notes (Signed)
Per wick placed  Lw edt

## 2019-06-11 NOTE — ED Triage Notes (Signed)
Pt arrived via ems from the [facility]. covid + on Friday. Ems called out for oxygen sats in 70's. Pt placed on nonrebreather 10L and sats increased to 96%. Pt c/o SOB, headache, nausea, loss of taste. Pt a&o on arrival. Able to speak in short sentences without distress.   EMS vitals 125/56 HR 80

## 2019-06-11 NOTE — H&P (Addendum)
History and Physical    Stephanie [name] KGM:[id number] DOB: 28-Mar-1951 DOA: 06/11/2019  Referring MD/NP/PA:   PCP: Odessa Fleming, NP   Patient coming from:  The patient is coming from [place]. At baseline, pt is dependent for most of ADL.        Chief Complaint: fever, cough, SOB  HPI: Stephanie [name] is a 69 y.o. female with medical history significant of hypertension, hyperlipidemia, diabetes mellitus, COPD, asthma, stroke, GERD, depression with anxiety, thrombocytopenia, liver cirrhosis, hepatitis B 20 years ago, bipolar disorder, iron deficiency anemia, CHF, who presents with fever, cough shortness of breath.  Patient has been having fever, chills, cough, shortness of breath for more than 6 days.  She also has some chest congestion.  Patient had positive COVID-19 test on Friday.  He has loss of taste, mild loose stool bowel movement, nausea and dry heaves.  Patient has intermittent abdominal discomfort.  No symptoms of UTI or unilateral weakness.  Patient has oxygen desaturation to 70s in ED, started nonrebreather.   ED Course: pt was found to have WBC 2.1, lactic acid 1.4, renal function okay, pending temperature measurements, soft blood pressure 92/48, heart rate 70s, chest x-ray showed bilateral multifocal infiltration.  Patient is admitted to [place] bed as inpatient.  Review of Systems:   General: has fevers, chills, no body weight gain, has poor appetite, has fatigue HEENT: no blurry vision, hearing changes or sore throat Respiratory: has dyspnea, coughing, no wheezing CV: no chest pain, no palpitations GI: has nausea, dry heaves, diarrhea, no abdominal pain, constipation GU: no dysuria, burning on urination, increased urinary frequency, hematuria  Ext: no leg edema Neuro: no unilateral weakness, numbness, or tingling, no vision change or hearing loss Skin: no rash, no skin tear. MSK: No muscle spasm, no deformity, no limitation of range of movement in spin Heme: No easy  bruising.  Travel history: No recent long distant travel.  Allergy:  Allergies  Allergen Reactions  . Cortisone     Increased BS  . Doxycycline Hives, Swelling, Other (See Comments) and Nausea Only    Reaction:  Facial swelling  Face red and swollen; no difficulty breathing  . Morphine Other (See Comments)    Past Medical History:  Diagnosis Date  . Adenomatous colon polyp 08/27/2014   Polyps x 3  . Anemia   . Anxiety   . 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 Arthritis   . Asthma   . Bipolar affective disorder ([place])    h/o  . Blood transfusion 2000  . Blood transfusion without reported diagnosis   . Cancer ([place])   . Chronic abdominal pain    abdominal wall pain  . 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 Colon polyp   . COPD (chronic obstructive pulmonary disease) ([place])   . Depression   . Diabetes mellitus without complication ([place])   . Fatty liver 04/09/08   found in abd CT  . GERD (gastroesophageal reflux disease)   . H/O: CVA (cardiovascular accident)    TIA  . H/O: rheumatic fever   . Hepatitis    HEP "B" twenty years ago  . Hyperlipidemia   . Hypertension   . IBS (irritable bowel syndrome)   . Incontinence   . Insomnia   . Lower extremity edema   . Neuropathy    lower legs D/T DM  . Obstructive sleep apnea    Use C-PAP twice per week  . Platelets decreased ([place])   .  RLS (restless legs syndrome)   . Shortness of breath   . Sleep apnea   . Stroke [facility])    TIA- 2002  . Ulcer   . Unspecified disorders of nervous system   . Wears dentures    full upper and lower    Past Surgical History:  Procedure Laterality Date  . ABDOMINAL EXPLORATION SURGERY    . ABDOMINAL HYSTERECTOMY     total  . Axillary artery cannulation via 8-mm Hemashield graft, median sternotomy, extracorporeal circulation with deep hypothermic circulatory arrest, repair of  aortic dessection  01/23/2009   Dr Arlyce Dice  . BACK SURGERY      x 5  . cataract     both eyes  .  CHOLECYSTECTOMY    . COLONOSCOPY    . COLONOSCOPY WITH PROPOFOL N/A 02/26/2019   Procedure: COLONOSCOPY WITH PROPOFOL;  Surgeon: Lucilla Lame, MD;  [facility] ENDOSCOPY;  Service: Endoscopy;  Laterality: N/A;  . ESOPHAGOGASTRODUODENOSCOPY (EGD) WITH PROPOFOL  02/26/2019   Procedure: ESOPHAGOGASTRODUODENOSCOPY (EGD) WITH PROPOFOL;  Surgeon: Lucilla Lame, MD;  Location: [facility] ENDOSCOPY;  Service: Endoscopy;;  . EYE SURGERY Bilateral    Cataract Extraction with IOL  . FACIAL LACERATION REPAIR Left 04/11/2018   Procedure: EARLOBE REPAIR;  Surgeon: Carloyn Manner, MD;  Location: [place];  Service: ENT;  Laterality: Left;  Diabetic - insulin and oral meds sleep apnea  . HEMORROIDECTOMY     and colon polyp removed  . KNEE ARTHROSCOPY WITH LATERAL RELEASE Right 11/15/2016   Procedure: KNEE ARTHROSCOPY WITH LATERAL RELEASE;  Surgeon: Corky Mull, MD;  Location: [facility] ORS;  Service: Orthopedics;  Laterality: Right;  . LIVER BIOPSY  04/10/2012   Procedure: LIVER BIOPSY;  Surgeon: Inda Castle, MD;  Location: [facility] ENDOSCOPY;  Service: Endoscopy;  Laterality: N/A;  ultrasound to mark order for abd limited/liver to be marked to be sent by [email or web address]   . LUMBAR FUSION  10/09  . LUMBAR WOUND DEBRIDEMENT  05/09/2011   Procedure: LUMBAR WOUND DEBRIDEMENT;  Surgeon: Dahlia Bailiff;  Location: [place];  Service: Orthopedics;  Laterality: N/A;  IRRIGATION AND DEBRIDEMENT SPINAL WOUND  . POSTERIOR CERVICAL FUSION/FORAMINOTOMY    . ROTATOR CUFF REPAIR     left    Social History:  reports that she quit smoking about 6 years ago. Her smoking use included cigarettes. She has a 75.00 pack-year smoking history. She has never used smokeless tobacco. She reports that she does not drink alcohol or use drugs.  Family History:  Family History  Problem Relation Age of Onset  . Breast cancer Mother   . Lung cancer Mother   . Stomach cancer Father   . Diabetes Other        4 aunts, and 1 uncle  .  Depression Brother   . Anxiety disorder Brother   . Colon cancer Neg Hx   . Esophageal cancer Neg Hx   . Rectal cancer Neg Hx      Prior to Admission medications   Medication Sig Start Date End Date Taking? Authorizing Provider  albuterol (PROVENTIL) (2.5 MG/3ML) 0.083% nebulizer solution Take 2.5 mg by nebulization every 6 (six) hours as needed for wheezing or shortness of breath.    [provider]  albuterol (VENTOLIN HFA) 108 (90 Base) MCG/ACT inhaler Inhale 2 puffs into the lungs every 6 (six) hours as needed for wheezing or shortness of breath. 04/22/19   Chesley Mires, MD  buPROPion (WELLBUTRIN SR) 100 MG 12 hr tablet Take 1 tablet (  100 mg total) by mouth daily after breakfast. 05/08/19   Ursula Alert, MD  cephALEXin (KEFLEX) 500 MG capsule Take 500 mg by mouth 3 (three) times daily. 03/26/19   [provider]  clonazePAM (KLONOPIN) 0.25 MG disintegrating tablet Take 0.25 mg by mouth 2 (two) times daily.     [provider]  diclofenac sodium (VOLTAREN) 1 % GEL Apply topically 3 (three) times daily.    [provider]  dicloxacillin (DYNAPEN) 250 MG capsule Take 250 mg by mouth 4 (four) times daily. 04/26/19   [provider]  dicyclomine (BENTYL) 10 MG capsule Take 10 mg by mouth 3 (three) times daily.     [provider]  donepezil (ARICEPT) 10 MG tablet Take 10 mg by mouth at bedtime.  11/29/18   [provider]  ferrous sulfate 325 (65 FE) MG tablet Take 325 mg by mouth 2 (two) times daily.     [provider]  furosemide (LASIX) 80 MG tablet Take 40 mg by mouth daily.     [provider]  gabapentin (NEURONTIN) 300 MG capsule Take 1 capsule (300 mg total) by mouth 3 (three) times daily. Takes 300 mg at 8 am , 2pm, 300 mg 8 pm. Patient taking differently: Take 300 mg by mouth 3 (three) times daily. Takes 300 mg at 8 am , 2pm, Taking 600 mg 8 pm. 01/01/19   Ursula Alert, MD  gabapentin (NEURONTIN) 600  MG tablet  04/23/19   [provider]  hydrochlorothiazide (HYDRODIURIL) 12.5 MG tablet Take 12.5 mg by mouth daily. 03/28/19   [provider]  hydrochlorothiazide (HYDRODIURIL) 25 MG tablet Take 25 mg by mouth daily. 04/17/19   [provider]  HYDROcodone-acetaminophen (NORCO) 7.5-325 MG tablet Take 1 tablet by mouth every 6 (six) hours as needed for moderate pain.    [provider]  hydrOXYzine (ATARAX/VISTARIL) 10 MG tablet Take 10 mg by mouth 2 (two) times daily. 03/22/19   [provider]  insulin glargine (LANTUS) 100 UNIT/ML injection Inject 42 Units into the skin every evening.     [provider]  insulin lispro (HUMALOG) 100 UNIT/ML injection Inject 8 Units into the skin 3 (three) times daily before meals.     [provider]  lactulose (CHRONULAC) 10 GM/15ML solution Take 10 g by mouth daily. (0800) 10/17/16   [provider]  lamoTRIgine (LAMICTAL) 150 MG tablet Take 1 tablet (150 mg total) by mouth 2 (two) times daily. 05/08/19   Ursula Alert, MD  lidocaine (LIDODERM) 5 % Place 1 patch onto the skin daily.     [provider]  liraglutide (VICTOZA) 18 MG/3ML SOPN Inject 1.8 mg into the skin daily.    [provider]  loperamide (IMODIUM) 2 MG capsule Take 4 mg by mouth as needed for diarrhea or loose stools.    [provider]  metoCLOPramide (REGLAN) 5 MG tablet Take 5 mg by mouth 3 (three) times daily before meals. (0800, 1200, 1600 & 2000)     [provider]  nystatin (MYCOSTATIN/NYSTOP) powder Apply topically 4 (four) times daily. Patient taking differently: Apply topically 2 (two) times daily. Apply topically to the are under abdomen 2 times a day x 4 days Per [facility] 08/07/17   Lequita Asal, MD  omeprazole (PRILOSEC) 20 MG capsule Take 20 mg by mouth daily.     [provider]  ondansetron (ZOFRAN) 4 MG tablet Take 4 mg by mouth 3 (three) times  daily before meals.  08/31/18   [provider]  potassium chloride SA (K-DUR) 20 MEQ tablet Take 20 mEq by mouth daily.  12/27/18   [provider]  pregabalin (LYRICA) 100 MG capsule  04/30/19   [provider]  pregabalin (LYRICA) 75 MG capsule  02/11/19   [provider]  RESTASIS 0.05 % ophthalmic emulsion Place 1 drop into both eyes 2 (two) times daily.  12/22/18   [provider]  rOPINIRole (REQUIP) 3 MG tablet Take 3 mg by mouth at bedtime. (2000)    [provider]  senna (GERI-KOT) 8.6 MG tablet Take 2 tablets by mouth daily.     [provider]  sertraline (ZOLOFT) 25 MG tablet Take 1 tablet (25 mg total) by mouth daily. 05/08/19   Ursula Alert, MD  simvastatin (ZOCOR) 10 MG tablet Take 10 mg by mouth at bedtime.    [provider]  SUPREP BOWEL PREP KIT 17.5-3.13-1.6 GM/177ML SOLN  02/25/19   [provider]  triamcinolone cream (KENALOG) 0.1 %  04/30/19   [provider]    Physical Exam: Vitals:   06/11/19 1218 06/11/19 1330 06/11/19 1430 06/11/19 1500  BP: (!) 92/48 (!) 103/47 (!) 102/50 (!) 103/56  Pulse: 73 63 64 65  Resp: 20 12 13 13   SpO2: 98% 98% 100% 100%  Weight: 101.2 kg     Height: 5' 3"  (1.6 m)      General: Not in acute distress HEENT:       Eyes: PERRL, EOMI, no scleral icterus.       ENT: No discharge from the ears and nose, no pharynx injection, no tonsillar enlargement.        Neck: No JVD, no bruit, no mass felt. Heme: No neck lymph node enlargement. Cardiac: S1/S2, RRR, No murmurs, No gallops or rubs. Respiratory: No rales, wheezing, rhonchi or rubs. GI: Soft, nondistended, nontender, no rebound pain, no organomegaly, BS present. GU: No hematuria Ext: No pitting leg edema bilaterally. 2+DP/PT pulse bilaterally. Musculoskeletal: No joint deformities, No joint redness or warmth, no limitation of ROM in spin. Skin: No rashes.  Neuro: Alert, oriented X3, cranial  nerves II-XII grossly intact, moves all extremities normally. Psych: Patient is not psychotic, no suicidal or hemocidal ideation.  Labs on Admission: I have personally reviewed following labs and imaging studies  CBC: Recent Labs  Lab 06/11/19 1236  WBC 2.1*  NEUTROABS 1.5*  HGB 10.8*  HCT 31.9*  MCV 90.4  PLT 80*   Basic Metabolic Panel: Recent Labs  Lab 06/11/19 1236  NA 132*  K 3.7  CL 97*  CO2 25  GLUCOSE 162*  BUN 16  CREATININE 1.08*  CALCIUM 8.1*   GFR: Estimated Creatinine Clearance: 56.6 mL/min (A) (by C-G formula based on SCr of 1.08 mg/dL (H)). Liver Function Tests: Recent Labs  Lab 06/11/19 1236  AST 43*  ALT 16  ALKPHOS 126  BILITOT 0.8  PROT 6.2*  ALBUMIN 3.2*   No results for input(s): LIPASE, AMYLASE in the last 168 hours. No results for input(s): AMMONIA in the last 168 hours. Coagulation Profile: No results for input(s): INR, PROTIME in the last 168 hours. Cardiac Enzymes: No results for input(s): CKTOTAL, CKMB, CKMBINDEX, TROPONINI in the last 168 hours. BNP (last 3 results) No results for input(s): PROBNP in the last 8760 hours. HbA1C: No results for input(s): HGBA1C in the last 72 hours. CBG: No results for input(s): GLUCAP in the last 168 hours. Lipid Profile: Recent  Labs    06/11/19 1236  TRIG 100   Thyroid Function Tests: No results for input(s): TSH, T4TOTAL, FREET4, T3FREE, THYROIDAB in the last 72 hours. Anemia Panel: Recent Labs    06/11/19 1236  FERRITIN 498*   Urine analysis:    Component Value Date/Time   COLORURINE STRAW (A) 11/20/2018 1052   APPEARANCEUR CLEAR (A) 11/20/2018 1052   APPEARANCEUR Cloudy 12/26/2013 0932   LABSPEC 1.006 11/20/2018 1052   LABSPEC 1.029 12/26/2013 0932   PHURINE 7.0 11/20/2018 1052   GLUCOSEU 50 (A) 11/20/2018 1052   GLUCOSEU Negative 12/26/2013 0932   HGBUR NEGATIVE 11/20/2018 1052   HGBUR negative 05/25/2010 0804   BILIRUBINUR NEGATIVE 11/20/2018 1052   BILIRUBINUR Negative  12/26/2013 0932   KETONESUR NEGATIVE 11/20/2018 1052   PROTEINUR NEGATIVE 11/20/2018 1052   UROBILINOGEN 0.2 07/29/2013 1235   UROBILINOGEN 0.2 05/25/2010 0804   NITRITE NEGATIVE 11/20/2018 1052   LEUKOCYTESUR NEGATIVE 11/20/2018 1052   LEUKOCYTESUR Trace 12/26/2013 0932   Sepsis Labs: @LABRCNTIP (procalcitonin:4,lacticidven:4) )No results found for this or any previous visit (from the past 240 hour(s)).   Radiological Exams on Admission: DG Chest Port 1 View  Result Date: 06/11/2019 CLINICAL DATA:  Shortness of breath.  COVID-19 positive EXAM: PORTABLE CHEST 1 VIEW COMPARISON:  November 20, 2018 FINDINGS: There is airspace consolidation in both mid and lower lung zones as well as in the right upper lobe. There is cardiomegaly with pulmonary vascularity normal. No adenopathy. Postoperative change noted in the lower cervical region. Thoracic stimulator lead tips are in the lower thoracic region. IMPRESSION: Multifocal airspace consolidation consistent with multifocal pneumonia. Stable cardiomegaly. No adenopathy evident. Electronically Signed   By: Lowella Grip III M.D.   On: 06/11/2019 12:49     EKG: Independently reviewed.  Sinus rhythm, QTC 442, low voltage, early R wave progression, nonspecific T wave change   Assessment/Plan Principal Problem:   Acute respiratory disease due to COVID-19 virus Active Problems:   Hyperlipidemia   Hypertension   Cirrhosis of liver (HCC)   Thrombocytopenia (HCC)   Iron deficiency anemia   Chronic diastolic CHF (congestive heart failure), NYHA class 2 (HCC)   Bipolar I disorder, most recent episode depressed (HCC)   COPD (chronic obstructive pulmonary disease) (HCC)   Diabetes mellitus without complication ([place])   Acute respiratory disease due to COVID-19 virus: Patient has oxygen desaturation to 70% on room air, has a bilateral multifocal infiltration on chest x-ray.  -will admit to med-surg as inpt -Remdesivir per pharm -pt is allergic to  cortisone, will not start steroid -vitamin C, zinc.   -Atrovent inhaler, PRN albuterol inhaler -PRN Mucinex for cough -f/u Blood culture -Gentle IV fluid:  -D-dimer, BNP,Trop, LFT, CRP, LDH, Procalcitonin, Ferritin, fibinogen, TG, Hep B SAg, HIV ab -Daily CRP, Ferritin, D-dimer, -Will ask the patient to maintain an awake prone position for 16+ hours a day, if possible, with a minimum of 2-3 hours at a time -Will attempt to maintain euvolemia to a net negative fluid status -IF patient deteriorates, will consult PCCM and ID  Hyperlipidemia: -zocor  Hypertension: Blood pressures are soft -Hold HCTZ and Lasix  Cirrhosis of liver ([place]): Mental status normal -Continue lactulose -Check ammonia level  Thrombocytopenia ([place]): Platelet 80.  No bleeding tendency.  Most likely due to liver cirrhosis -F/u by CBC  Iron deficiency anemia Continue iron supplement  Chronic diastolic CHF (congestive heart failure), NYHA class 2 ([place]): 2D echo on 07/16/2018 showed EF>55.  No leg edema.  No pulmonary edema chest  x-ray PA CHF is compensated. -Hold Lasix due to softer blood pressure  Bipolar I disorder, most recent episode depressed ([place]): -Continue home medication  COPD (chronic obstructive pulmonary disease) ([place]): -On bronchodilators  Diabetes mellitus without complication ([place]): Last A1c 5.6 on 11/20/2018, well controled. Patient is taking Humalog, Victoza, Lantus at home -will decrease Lantus dose from 20 to 15 unit daily -SSI   Inpatient status:  # Patient requires inpatient status due to high intensity of service, high risk for further deterioration and high frequency of surveillance required.  I certify that at the point of admission it is my clinical judgment that the patient will require inpatient hospital care spanning beyond 2 midnights from the point of admission.  . This patient has multiple chronic comorbidities including hypertension, hyperlipidemia, diabetes mellitus, COPD,  asthma, stroke, GERD, depression with anxiety, thrombocytopenia, liver cirrhosis, hepatitis B 20 years ago, bipolar disorder, iron deficiency anemia, dCHF . Now patient has presenting with acute respiratory disease due to COVID-19 virus with hypoxia . The initial radiographic and laboratory data are worrisome because of thrombocytopenia, bilateral multifocal infiltrates on chest x-ray . Current medical needs: please see my assessment and plan . Predictability of an adverse outcome (risk): Patient has multiple comorbidities as listed above. Now presenting with acute respiratory disease due to COVID-19 virus with hypoxia. Patient's presentation is highly complicated.  Patient is at high risk of deteriorating.  Will need to be treated in hospital for at least 2 days.   DVT ppx: SCD Code Status: Full code Family Communication: None at bed side.    Disposition Plan:  Anticipate discharge back to previous environment Consults called:  none Admission status: Med-surg bed as inpt     Date of Service 06/11/2019    [facility]   If 7PM-7AM, please contact night-coverage [email or web address] Password [facility] 06/11/2019, 3:38 PM

## 2019-06-11 NOTE — ED Provider Notes (Signed)
[facility] Emergency Department Provider Note  Time seen: 12:43 PM  I have reviewed the triage vital signs and the nursing notes.   HISTORY  Chief Complaint Shortness of Breath and covid +   HPI Stephanie [name] is a 69 y.o. female with multiple medical issues including COPD, diabetes, hypertension, hyperlipidemia, obesity, presents to the emergency department for worsening shortness of breath and weakness.  Patient tested positive for Covid virus 4 days ago, states since that time she has had worsening shortness of breath cough and generalized weakness.  States she has had mild amount of diarrhea and has been having dry heaving with nausea.  Patient states she has been having subjective fever at home as well.  Patient satting in the 70s per EMS upon arrival.  Patient currently on nonrebreather mask.  Past Medical History:  Diagnosis Date  . Adenomatous colon polyp 08/27/2014   Polyps x 3  . Anemia   . Anxiety   . 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 Arthritis   . Asthma   . Bipolar affective disorder ([place])    h/o  . Blood transfusion 2000  . Blood transfusion without reported diagnosis   . Cancer ([place])   . Chronic abdominal pain    abdominal wall pain  . 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 Colon polyp   . COPD (chronic obstructive pulmonary disease) ([place])   . Depression   . Diabetes mellitus without complication ([place])   . Fatty liver 04/09/08   found in abd CT  . GERD (gastroesophageal reflux disease)   . H/O: CVA (cardiovascular accident)    TIA  . H/O: rheumatic fever   . Hepatitis    HEP "B" twenty years ago  . Hyperlipidemia   . Hypertension   . IBS (irritable bowel syndrome)   . Incontinence   . Insomnia   . Lower extremity edema   . Neuropathy    lower legs D/T DM  . Obstructive sleep apnea    Use C-PAP twice per week  . Platelets decreased ([place])   . RLS (restless legs syndrome)   . Shortness of breath   . Sleep apnea   .  Stroke [facility])    TIA- 2002  . Ulcer   . Unspecified disorders of nervous system   . Wears dentures    full upper and lower    Patient Active Problem List   Diagnosis Date Noted  . Exposure to communicable disease 06/04/2019  . 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 Cellulitis of right lower limb 05/08/2019  . Depressed mood 05/08/2019  . Diarrhea 05/08/2019  . Eczema 05/08/2019  . Localized edema 05/08/2019  . Melanocytic nevi, unspecified 05/08/2019  . New daily persistent headache 05/08/2019  . Peripheral venous insufficiency 05/08/2019  . Pruritus, unspecified 05/08/2019  . Gastritis without bleeding   . Personal history of colonic polyps   . Polyp of descending colon   . Polyp of ascending colon   . Cough 01/01/2019  . Gastroesophageal reflux disease with esophagitis 01/01/2019  . Hypoglycemia due to type 2 diabetes mellitus ([place]) 01/01/2019  . Irritant contact dermatitis due to cosmetics 01/01/2019  . Long term (current) use of insulin ([place]) 01/01/2019  . Peripheral vascular disease ([place]) 01/01/2019  . Bipolar I disorder, most recent episode depressed ([place]) 01/01/2019  . Bipolar 1 disorder, manic, moderate ([place]) 11/20/2018  . Tremor 10/03/2018  . Loss of memory 07/24/2018  . Numbness and tingling  of both legs 07/24/2018  . Obesity (BMI 30-39.9) 07/19/2018  . Chronic diastolic CHF (congestive heart failure), NYHA class 2 ([place]) 07/17/2018  . Mild mitral regurgitation 07/05/2018  . Unsteady gait 04/09/2018  . Diabetic peripheral neuropathy associated with type 2 diabetes mellitus ([place]) 04/09/2018  . Laceration without foreign body, right lower leg, initial encounter 04/09/2018  . Chronic obstructive pulmonary disease, unspecified ([place]) 04/09/2018  . Unilateral primary osteoarthritis, right knee 04/09/2018  . Sleep apnea 04/09/2018  . Chronic hip pain, right 04/09/2018  . Closed impacted fracture of hip ([place]) 04/09/2018  . Tricompartment osteoarthritis of right knee  10/18/2017  . Sepsis ([place]) 07/20/2017  . Obesity, Class III, BMI 40-49.9 (morbid obesity) ([place]) 05/19/2017  . Cervical stenosis of spine 04/25/2017  . Stress fracture of femur 04/17/2017  . Elevated alkaline phosphatase level 04/16/2017  . Altered mental status 03/07/2017  . Degenerative tear of lateral meniscus of right knee 11/15/2016  . Encephalopathy 07/19/2016  . Degenerative tear of medial meniscus of right knee 07/01/2016  . Primary osteoarthritis of right knee 07/01/2016  . Diabetes mellitus type 1, uncontrolled, without complications 81/06/7508  . Lymphedema 06/14/2016  . Pain in limb 05/06/2016  . Swelling of limb 05/06/2016  . Postphlebitic syndrome with inflammation 05/06/2016  . Pneumonia 04/18/2016  . Chest pain 04/04/2016  . Iron deficiency anemia 01/30/2016  . Bilateral carotid artery stenosis 01/12/2016  . Hypoglycemia 09/09/2015  . Hyperglycemia 07/16/2015  . Neuropathy 06/08/2015  . Thrombocytopenia ([place]) 08/19/2014  . Abdominal pain, chronic, epigastric 06/12/2014  . Liver cirrhosis secondary to NASH ([place]) 02/27/2014  . Dizziness and giddiness 10/30/2013  . DOE (dyspnea on exertion) 10/30/2013  . Poorly controlled type 2 diabetes mellitus with complication ([place]) 25/85/2778  . DNR (do not resuscitate) 07/29/2013  . Encounter for routine gynecological examination 07/29/2013  . Dyspnea 07/10/2013  . Lung nodule 07/10/2013  . Varicose veins of left lower extremity 10/09/2012  . Gastroesophageal reflux disease 10/09/2012  . Lower extremity edema 02/02/2012  . Cirrhosis of liver ([place]) 02/02/2012  . GAD (generalized anxiety disorder) 07/11/2011  . Aortic dissection ([place])   . BACK PAIN, LUMBAR, CHRONIC 11/19/2009  . Chronic pain syndrome 11/06/2009  . UNS ADVRS EFF OTH RX MEDICINAL&BIOLOGICAL SBSTNC 12/16/2008  . Irritable bowel syndrome with constipation 10/23/2008  . UNSPECIFIED VITAMIN D DEFICIENCY 09/26/2008  . TOBACCO ABUSE 06/19/2008  . EDEMA, LOCALIZED  01/07/2008  . Restless legs syndrome 04/27/2007  . Insomnia due to mental disorder 04/27/2007  . Hyperlipidemia 03/08/2007  . OSA (obstructive sleep apnea) 03/08/2007  . Hypertension 03/08/2007  . Asthma 03/08/2007  . Urinary incontinence 03/08/2007  . BIPOLAR AFFECTIVE DISORDER, HX OF 03/08/2007    Past Surgical History:  Procedure Laterality Date  . ABDOMINAL EXPLORATION SURGERY    . ABDOMINAL HYSTERECTOMY     total  . Axillary artery cannulation via 8-mm Hemashield graft, median sternotomy, extracorporeal circulation with deep hypothermic circulatory arrest, repair of  aortic dessection  01/23/2009   Dr Arlyce Dice  . BACK SURGERY      x 5  . cataract     both eyes  . CHOLECYSTECTOMY    . COLONOSCOPY    . COLONOSCOPY WITH PROPOFOL N/A 02/26/2019   Procedure: COLONOSCOPY WITH PROPOFOL;  Surgeon: Lucilla Lame, MD;  Location: [facility] ENDOSCOPY;  Service: Endoscopy;  Laterality: N/A;  . ESOPHAGOGASTRODUODENOSCOPY (EGD) WITH PROPOFOL  02/26/2019   Procedure: ESOPHAGOGASTRODUODENOSCOPY (EGD) WITH PROPOFOL;  Surgeon: Lucilla Lame, MD;  Location: [facility] ENDOSCOPY;  Service: Endoscopy;;  . EYE SURGERY  Bilateral    Cataract Extraction with IOL  . FACIAL LACERATION REPAIR Left 04/11/2018   Procedure: EARLOBE REPAIR;  Surgeon: Carloyn Manner, MD;  Location: [facility];  Service: ENT;  Laterality: Left;  Diabetic - insulin and oral meds sleep apnea  . HEMORROIDECTOMY     and colon polyp removed  . KNEE ARTHROSCOPY WITH LATERAL RELEASE Right 11/15/2016   Procedure: KNEE ARTHROSCOPY WITH LATERAL RELEASE;  Surgeon: Corky Mull, MD;  Location: [facility] ORS;  Service: Orthopedics;  Laterality: Right;  . LIVER BIOPSY  04/10/2012   Procedure: LIVER BIOPSY;  Surgeon: Inda Castle, MD;  Location: [facility] ENDOSCOPY;  Service: Endoscopy;  Laterality: N/A;  ultrasound to mark order for abd limited/liver to be marked to be sent by [email or web address]   . LUMBAR FUSION  10/09  . LUMBAR WOUND DEBRIDEMENT  05/09/2011    Procedure: LUMBAR WOUND DEBRIDEMENT;  Surgeon: Dahlia Bailiff;  Location: [place];  Service: Orthopedics;  Laterality: N/A;  IRRIGATION AND DEBRIDEMENT SPINAL WOUND  . POSTERIOR CERVICAL FUSION/FORAMINOTOMY    . ROTATOR CUFF REPAIR     left    Prior to Admission medications   Medication Sig Start Date End Date Taking? Authorizing Provider  albuterol (PROVENTIL) (2.5 MG/3ML) 0.083% nebulizer solution Take 2.5 mg by nebulization every 6 (six) hours as needed for wheezing or shortness of breath.    [provider]  albuterol (VENTOLIN HFA) 108 (90 Base) MCG/ACT inhaler Inhale 2 puffs into the lungs every 6 (six) hours as needed for wheezing or shortness of breath. 04/22/19   Chesley Mires, MD  buPROPion (WELLBUTRIN SR) 100 MG 12 hr tablet Take 1 tablet (100 mg total) by mouth daily after breakfast. 05/08/19   Ursula Alert, MD  cephALEXin (KEFLEX) 500 MG capsule Take 500 mg by mouth 3 (three) times daily. 03/26/19   [provider]  clonazePAM (KLONOPIN) 0.25 MG disintegrating tablet Take 0.25 mg by mouth 2 (two) times daily.     [provider]  diclofenac sodium (VOLTAREN) 1 % GEL Apply topically 3 (three) times daily.    [provider]  dicloxacillin (DYNAPEN) 250 MG capsule Take 250 mg by mouth 4 (four) times daily. 04/26/19   [provider]  dicyclomine (BENTYL) 10 MG capsule Take 10 mg by mouth 3 (three) times daily.     [provider]  donepezil (ARICEPT) 10 MG tablet Take 10 mg by mouth at bedtime.  11/29/18   [provider]  ferrous sulfate 325 (65 FE) MG tablet Take 325 mg by mouth 2 (two) times daily.     [provider]  furosemide (LASIX) 80 MG tablet Take 40 mg by mouth daily.     [provider]  gabapentin (NEURONTIN) 300 MG capsule Take 1 capsule (300 mg total) by mouth 3 (three) times daily. Takes 300 mg at 8 am , 2pm, 300 mg 8 pm. Patient taking differently: Take 300 mg by mouth 3 (three) times  daily. Takes 300 mg at 8 am , 2pm, Taking 600 mg 8 pm. 01/01/19   Ursula Alert, MD  gabapentin (NEURONTIN) 600 MG tablet  04/23/19   [provider]  hydrochlorothiazide (HYDRODIURIL) 12.5 MG tablet Take 12.5 mg by mouth daily. 03/28/19   [provider]  hydrochlorothiazide (HYDRODIURIL) 25 MG tablet Take 25 mg by mouth daily. 04/17/19   [provider]  HYDROcodone-acetaminophen (NORCO) 7.5-325 MG tablet Take 1 tablet by mouth every 6 (six) hours as needed for moderate pain.  [provider]  hydrOXYzine (ATARAX/VISTARIL) 10 MG tablet Take 10 mg by mouth 2 (two) times daily. 03/22/19   [provider]  insulin glargine (LANTUS) 100 UNIT/ML injection Inject 42 Units into the skin every evening.     [provider]  insulin lispro (HUMALOG) 100 UNIT/ML injection Inject 8 Units into the skin 3 (three) times daily before meals.     [provider]  lactulose (CHRONULAC) 10 GM/15ML solution Take 10 g by mouth daily. (0800) 10/17/16   [provider]  lamoTRIgine (LAMICTAL) 150 MG tablet Take 1 tablet (150 mg total) by mouth 2 (two) times daily. 05/08/19   Ursula Alert, MD  lidocaine (LIDODERM) 5 % Place 1 patch onto the skin daily.     [provider]  liraglutide (VICTOZA) 18 MG/3ML SOPN Inject 1.8 mg into the skin daily.    [provider]  loperamide (IMODIUM) 2 MG capsule Take 4 mg by mouth as needed for diarrhea or loose stools.    [provider]  metoCLOPramide (REGLAN) 5 MG tablet Take 5 mg by mouth 3 (three) times daily before meals. (0800, 1200, 1600 & 2000)     [provider]  nystatin (MYCOSTATIN/NYSTOP) powder Apply topically 4 (four) times daily. Patient taking differently: Apply topically 2 (two) times daily. Apply topically to the are under abdomen 2 times a day x 4 days Per [facility] 08/07/17   Lequita Asal, MD  omeprazole (PRILOSEC) 20 MG capsule Take 20 mg by  mouth daily.     [provider]  ondansetron (ZOFRAN) 4 MG tablet Take 4 mg by mouth 3 (three) times daily before meals.  08/31/18   [provider]  potassium chloride SA (K-DUR) 20 MEQ tablet Take 20 mEq by mouth daily.  12/27/18   [provider]  pregabalin (LYRICA) 100 MG capsule  04/30/19   [provider]  pregabalin (LYRICA) 75 MG capsule  02/11/19   [provider]  RESTASIS 0.05 % ophthalmic emulsion Place 1 drop into both eyes 2 (two) times daily.  12/22/18   [provider]  rOPINIRole (REQUIP) 3 MG tablet Take 3 mg by mouth at bedtime. (2000)    [provider]  senna (GERI-KOT) 8.6 MG tablet Take 2 tablets by mouth daily.     [provider]  sertraline (ZOLOFT) 25 MG tablet Take 1 tablet (25 mg total) by mouth daily. 05/08/19   Ursula Alert, MD  simvastatin (ZOCOR) 10 MG tablet Take 10 mg by mouth at bedtime.    [provider]  SUPREP BOWEL PREP KIT 17.5-3.13-1.6 GM/177ML SOLN  02/25/19   [provider]  triamcinolone cream (KENALOG) 0.1 %  04/30/19   [provider]    Allergies  Allergen Reactions  . Cortisone     Increased BS  . Doxycycline Hives, Swelling, Other (See Comments) and Nausea Only    Reaction:  Facial swelling  Face red and swollen; no difficulty breathing  . Morphine Other (See Comments)    Family History  Problem Relation Age of Onset  . Breast cancer Mother   . Lung cancer Mother   . Stomach cancer Father   . Diabetes Other        4 aunts, and 1 uncle  . Depression Brother   . Anxiety disorder Brother   . Colon cancer Neg Hx   . Esophageal cancer Neg Hx   . Rectal cancer Neg Hx     Social  History Social History   Tobacco Use  . Smoking status: Former Smoker    Packs/day: 1.50    Years: 50.00    Pack years: 75.00    Types: Cigarettes    Quit date: 04/06/2013    Years since quitting: 6.1  . Smokeless tobacco: Never Used  Substance Use  Topics  . Alcohol use: No    Alcohol/week: 0.0 standard drinks  . Drug use: No    Review of Systems Constitutional: Subjective fever.  Positive for generalized weakness Cardiovascular: Negative for chest pain. Respiratory: Positive for shortness of breath.  Positive for cough. Gastrointestinal: Negative for abdominal pain.  Positive for nausea.  Negative for vomiting.  Positive for diarrhea. Genitourinary: Negative for urinary compaints Musculoskeletal: Negative for musculoskeletal complaints Neurological: Negative for headache All other ROS negative  ____________________________________________   PHYSICAL EXAM:  VITAL SIGNS: ED Triage Vitals  Enc Vitals Group     BP 06/11/19 1218 (!) 92/48     Pulse Rate 06/11/19 1218 73     Resp 06/11/19 1218 20     Temp --      Temp src --      SpO2 06/11/19 1218 98 %     Weight 06/11/19 1218 223 lb (101.2 kg)     Height 06/11/19 1218 5' 3"  (1.6 m)     Head Circumference --      Peak Flow --      Pain Score 06/11/19 1217 8     Pain Loc --      Pain Edu? --      Excl. in [place]? --    Constitutional: Alert and oriented. Well appearing and in no distress. Eyes: Normal exam ENT      Head: Normocephalic and atraumatic.      Mouth/Throat: Mucous membranes are moist. Cardiovascular: Normal rate, regular rhythm.  Respiratory: Mild tachypnea.  No obvious wheeze rales or rhonchi. Gastrointestinal: Soft and nontender. No distention.  Obese. Musculoskeletal: Nontender with normal range of motion in all extremities.  Neurologic:  Normal speech and language. No gross focal neurologic deficits  Skin:  Skin is warm, dry and intact.  Psychiatric: Mood and affect are normal. Speech and behavior are normal.   ____________________________________________    EKG  EKG viewed and interpreted by myself shows a normal sinus rhythm at 74 bpm with a narrow QRS, normal axis, normal intervals, no concerning ST  changes.  ____________________________________________    RADIOLOGY  Multifocal pneumonia on x-ray.  ____________________________________________   INITIAL IMPRESSION / ASSESSMENT AND PLAN / ED COURSE  Pertinent labs & imaging results that were available during my care of the patient were reviewed by me and considered in my medical decision making (see chart for details).   Patient presents to the emergency department Covid positive x4 days with worsening cough shortness of breath hypoxic in the 70s.  We will check labs, IV hydrate, obtain a chest x-ray.  Highly suspect worsening Covid symptoms.  Patient currently on nonrebreather mask satting in the mid 90s we will try the patient on high flow nasal oxygen and reassess.  X-ray consistent multifocal pneumonia.  Lab work otherwise largely [place].  Overall work-up consistent with COVID-19 with hypoxia and multifocal pneumonia.  Patient will be admitted to the hospital service for further treatment.  Stephanie [name] was evaluated in Emergency Department on 06/11/2019 for the symptoms described in the history of present illness. She was evaluated in the context of the global COVID-19 pandemic, which necessitated consideration that  the patient might be at risk for infection with the SARS-CoV-2 virus that causes COVID-19. Institutional protocols and algorithms that pertain to the evaluation of patients at risk for COVID-19 are in a state of rapid change based on information released by regulatory bodies including the CDC and federal and state organizations. These policies and algorithms were followed during the patient's care in the ED.  ____________________________________________   FINAL CLINICAL IMPRESSION(S) / ED DIAGNOSES  COVID-19 Hypoxia   Harvest Dark, MD 06/11/19 1421

## 2019-06-12 DIAGNOSIS — U071 COVID-19: Principal | ICD-10-CM

## 2019-06-12 DIAGNOSIS — J069 Acute upper respiratory infection, unspecified: Secondary | ICD-10-CM

## 2019-06-12 LAB — FERRITIN: Ferritin: 530 ng/mL — ABNORMAL HIGH (ref 11–307)

## 2019-06-12 LAB — LACTIC ACID, PLASMA: Lactic Acid, Venous: 1.1 mmol/L (ref 0.5–1.9)

## 2019-06-12 LAB — COMPREHENSIVE METABOLIC PANEL
ALT: 16 U/L (ref 0–44)
AST: 41 U/L (ref 15–41)
Albumin: 3 g/dL — ABNORMAL LOW (ref 3.5–5.0)
Alkaline Phosphatase: 121 U/L (ref 38–126)
Anion gap: 8 (ref 5–15)
BUN: 13 mg/dL (ref 8–23)
CO2: 27 mmol/L (ref 22–32)
Calcium: 8 mg/dL — ABNORMAL LOW (ref 8.9–10.3)
Chloride: 100 mmol/L (ref 98–111)
Creatinine, Ser: 0.84 mg/dL (ref 0.44–1.00)
GFR calc Af Amer: >60 mL/min (ref >60)
GFR calc non Af Amer: >60 mL/min (ref >60)
Glucose, Bld: 149 mg/dL — ABNORMAL HIGH (ref 70–99)
Potassium: 3.5 mmol/L (ref 3.5–5.1)
Sodium: 135 mmol/L (ref 135–145)
Total Bilirubin: 0.9 mg/dL (ref 0.3–1.2)
Total Protein: 6.2 g/dL — ABNORMAL LOW (ref 6.5–8.1)

## 2019-06-12 LAB — CBC WITH DIFFERENTIAL/PLATELET
Abs Immature Granulocytes: 0.02 10*3/uL (ref 0.00–0.07)
Basophils Absolute: 0 10*3/uL (ref 0.0–0.1)
Basophils Relative: 0 %
Eosinophils Absolute: 0 10*3/uL (ref 0.0–0.5)
Eosinophils Relative: 0 %
HCT: 32.5 % — ABNORMAL LOW (ref 36.0–46.0)
Hemoglobin: 10.7 g/dL — ABNORMAL LOW (ref 12.0–15.0)
Immature Granulocytes: 1 %
Lymphocytes Relative: 9 %
Lymphs Abs: 0.2 10*3/uL — ABNORMAL LOW (ref 0.7–4.0)
MCH: 30 pg (ref 26.0–34.0)
MCHC: 32.9 g/dL (ref 30.0–36.0)
MCV: 91 fL (ref 80.0–100.0)
Monocytes Absolute: 0.3 10*3/uL (ref 0.1–1.0)
Monocytes Relative: 13 %
Neutro Abs: 1.8 10*3/uL (ref 1.7–7.7)
Neutrophils Relative %: 77 %
Platelets: 79 10*3/uL — ABNORMAL LOW (ref 150–400)
RBC: 3.57 MIL/uL — ABNORMAL LOW (ref 3.87–5.11)
RDW: 15.2 % (ref 11.5–15.5)
WBC: 2.3 10*3/uL — ABNORMAL LOW (ref 4.0–10.5)
nRBC: 0 % (ref 0.0–0.2)

## 2019-06-12 LAB — GLUCOSE, CAPILLARY
Glucose-Capillary: 128 mg/dL — ABNORMAL HIGH (ref 70–99)
Glucose-Capillary: 107 mg/dL — ABNORMAL HIGH (ref 70–99)
Glucose-Capillary: 186 mg/dL — ABNORMAL HIGH (ref 70–99)
Glucose-Capillary: 198 mg/dL — ABNORMAL HIGH (ref 70–99)

## 2019-06-12 LAB — D-DIMER, QUANTITATIVE: D-Dimer, Quant: 0.94 ug/mL-FEU — ABNORMAL HIGH (ref 0.00–0.50)

## 2019-06-12 LAB — C-REACTIVE PROTEIN: CRP: 11.7 mg/dL — ABNORMAL HIGH (ref <1.0)

## 2019-06-12 LAB — MRSA PCR SCREENING: MRSA by PCR: NEGATIVE

## 2019-06-12 LAB — MAGNESIUM: Magnesium: 2.1 mg/dL (ref 1.7–2.4)

## 2019-06-12 MED ORDER — KETOROLAC TROMETHAMINE 15 MG/ML IJ SOLN
15.0000 mg | Freq: Three times a day (TID) | INTRAMUSCULAR | Status: DC | PRN
  Administered 2019-06-12: 15 mg via INTRAVENOUS
  Filled 2019-06-12 (×2): qty 1

## 2019-06-12 MED ORDER — DEXAMETHASONE SODIUM PHOSPHATE 10 MG/ML IJ SOLN
6.0000 mg | INTRAMUSCULAR | Status: DC
  Administered 2019-06-12 – 2019-06-13 (×2): 6 mg via INTRAVENOUS
  Filled 2019-06-12 (×2): qty 0.6
  Filled 2019-06-12: qty 1
  Filled 2019-06-12: qty 0.6

## 2019-06-12 MED ORDER — ENOXAPARIN SODIUM 40 MG/0.4ML ~~LOC~~ SOLN
40.0000 mg | SUBCUTANEOUS | Status: DC
  Administered 2019-06-12 – 2019-06-14 (×3): 40 mg via SUBCUTANEOUS
  Filled 2019-06-12 (×3): qty 0.4

## 2019-06-12 MED ORDER — SODIUM CHLORIDE 0.9 % IV BOLUS
500.0000 mL | Freq: Once | INTRAVENOUS | Status: AC
  Administered 2019-06-12: 08:00:00 500 mL via INTRAVENOUS

## 2019-06-12 MED ORDER — SODIUM CHLORIDE 0.9 % IV BOLUS
500.0000 mL | Freq: Once | INTRAVENOUS | Status: AC
  Administered 2019-06-12: 11:00:00 500 mL via INTRAVENOUS

## 2019-06-12 MED ORDER — CHLORHEXIDINE GLUCONATE CLOTH 2 % EX PADS
6.0000 | MEDICATED_PAD | Freq: Every day | CUTANEOUS | Status: DC
  Administered 2019-06-12 – 2019-06-14 (×3): 6 via TOPICAL

## 2019-06-12 NOTE — Progress Notes (Signed)
Pt designated caller Stephanie [name], called and updated on pt condition. Explained that she will be the only person we call and she will need to pass that information along to others. No questions at this time.

## 2019-06-12 NOTE — Progress Notes (Signed)
PROGRESS NOTE    Stephanie [name]  KDT:[id number] DOB: 06/19/1950 DOA: 06/11/2019 PCP: Odessa Fleming, NP    Assessment & Plan:   Principal Problem:   Acute respiratory disease due to COVID-19 virus Active Problems:   Hyperlipidemia   Hypertension   Cirrhosis of liver ([place])   Thrombocytopenia ([place])   Iron deficiency anemia   Chronic diastolic CHF (congestive heart failure), NYHA class 2 (HCC)   Bipolar I disorder, most recent episode depressed ([place])   COPD (chronic obstructive pulmonary disease) ([place])   Diabetes mellitus without complication ([place])    Stephanie [name] is a 69 y.o. Caucasian female with medical history significant of hypertension, hyperlipidemia, diabetes mellitus, COPD, asthma, stroke, GERD, depression with anxiety, thrombocytopenia, liver cirrhosis, hepatitis B 20 years ago, bipolar disorder, iron deficiency anemia, CHF, who presents with fever, cough shortness of breath.   Acute hypoxic respiratory failure due to COVID-19 PNA --In the ED, Patient has oxygen desaturation to 70% on room air, with bilateral multifocal infiltration on chest x-ray.  Remdesivir started, but steroid not started 2/2 reported allergy to cortisone. --O2 requirement went up to 12L today. PLAN: --continue Remdesivir --Start IV decadron 6 mg daily (benefit outweigh the risk, and allergy unlikely to be real) -vitamin C, zinc.  -Atrovent inhaler, PRN albuterol inhaler -PRN Mucinex for cough -Request to transfer to stepdown unit since o2 requirement had dramatically increased.  Hyperlipidemia: -zocor  Hypotension Hx of ypertension --BP remained low despite multiple boluses -Hold home HCTZ and Lasix --Hold home opioids and benzo  Cirrhosis of liver ([place]):  Mental status normal -Continue lactulose  Thrombocytopenia ([place]):  Platelet 80.  No bleeding tendency.  Most likely due to liver cirrhosis  Iron deficiency anemia Continue iron supplement  Chronic diastolic CHF    --NYHA class 2 ([place]): 2D echo on 07/16/2018 showed EF>55.  No leg edema.  No pulmonary edema chest x-ray PA CHF is compensated. -Hold Lasix due to softer blood pressure  Bipolar I disorder, most recent episode depressed (HCC) Anxiety -Continue home bupropion, sertraline, lamictal --Hold home Klonopin 2/2 low BP  COPD (chronic obstructive pulmonary disease) ([place]): -On bronchodilators  Diabetes mellitus without complication ([place]):  Last A1c 5.6 on 11/20/2018, well controled. Patient is taking Humalog, Victoza, Lantus at home -continue Lantus 15 unit daily -SSI  Chronic back pain on chronic opioids --Hold home Norco 2/2 low BP --continue home gabapentin and Lyrica --IV toradol 15 mg q8h PRN for now   DVT prophylaxis: Lovenox SQ Code Status: Full code  Family Communication: not today Disposition Plan: Transfer to stepdown unit.     Subjective and Interval History:  Pt mainly complained about back pain (chronic) and soreness in her abdomen.  Did not feel dyspnea.  No cough.  No fever, chest pain, N/V/D, dysuria.    Pt's blood pressure has been low, so 500 ml bolus x2 given.  Later, O2 requirement went up to 12L, so transfer request put in for stepdown.    Objective: Vitals:   06/12/19 0355 06/12/19 0741 06/12/19 1000 06/12/19 1259  BP: (!) 103/53 (!) 82/36 (!) 89/38 (!) 105/44  Pulse: 72 71    Resp: 20 19  20   Temp: 98.4 F (36.9 C)     TempSrc: Oral     SpO2: 91% 96%  91%  Weight:      Height:        Intake/Output Summary (Last 24 hours) at 06/12/2019 1456 Last data filed at 06/12/2019 0410 Gross per 24 hour  Intake  290.12 ml  Output 500 ml  Net -209.88 ml   Filed Weights   06/11/19 1218 06/11/19 2143  Weight: 101.2 kg 101.6 kg    Examination:   Constitutional: NAD, AAOx3 HEENT: conjunctivae and lids normal, EOMI CV: RRR no M,R,G. Distal pulses +2.  No cyanosis.   RESP: CTA B/L over anterior, normal respiratory effort, on 10L  GI: +BS, NTND Extremities:  No effusions, edema, or tenderness in BLE SKIN: warm, dry and intact Neuro: II - XII grossly intact.  Sensation intact Psych: Normal mood and affect.     Data Reviewed: I have personally reviewed following labs and imaging studies  CBC: Recent Labs  Lab 06/11/19 1236 06/12/19 0130  WBC 2.1* 2.3*  NEUTROABS 1.5* 1.8  HGB 10.8* 10.7*  HCT 31.9* 32.5*  MCV 90.4 91.0  PLT 80* 79*   Basic Metabolic Panel: Recent Labs  Lab 06/11/19 1236 06/12/19 0130  NA 132* 135  K 3.7 3.5  CL 97* 100  CO2 25 27  GLUCOSE 162* 149*  BUN 16 13  CREATININE 1.08* 0.84  CALCIUM 8.1* 8.0*  MG  --  2.1   GFR: Estimated Creatinine Clearance: 73 mL/min (by C-G formula based on SCr of 0.84 mg/dL). Liver Function Tests: Recent Labs  Lab 06/11/19 1236 06/12/19 0130  AST 43* 41  ALT 16 16  ALKPHOS 126 121  BILITOT 0.8 0.9  PROT 6.2* 6.2*  ALBUMIN 3.2* 3.0*   No results for input(s): LIPASE, AMYLASE in the last 168 hours. Recent Labs  Lab 06/11/19 1930  AMMONIA 13   Coagulation Profile: No results for input(s): INR, PROTIME in the last 168 hours. Cardiac Enzymes: No results for input(s): CKTOTAL, CKMB, CKMBINDEX, TROPONINI in the last 168 hours. BNP (last 3 results) No results for input(s): PROBNP in the last 8760 hours. HbA1C: No results for input(s): HGBA1C in the last 72 hours. CBG: Recent Labs  Lab 06/11/19 1814 06/11/19 2019 06/11/19 2342 06/12/19 0354 06/12/19 0741  GLUCAP 106* 111* 140* 128* 107*   Lipid Profile: Recent Labs    06/11/19 1236  TRIG 100   Thyroid Function Tests: No results for input(s): TSH, T4TOTAL, FREET4, T3FREE, THYROIDAB in the last 72 hours. Anemia Panel: Recent Labs    06/11/19 1236 06/12/19 0130  FERRITIN 498* 530*   Sepsis Labs: Recent Labs  Lab 06/11/19 1236 06/12/19 0030  PROCALCITON <0.10  --   LATICACIDVEN 1.4 1.1    Recent Results (from the past 240 hour(s))  Culture, blood (Routine X 2) w Reflex to ID Panel     Status:  None (Preliminary result)   Collection Time: 06/11/19  7:00 PM   Specimen: BLOOD  Result Value Ref Range Status   Specimen Description BLOOD BLOOD LEFT ARM  Final   Special Requests   Final    BOTTLES DRAWN AEROBIC AND ANAEROBIC Blood Culture results may not be optimal due to an excessive volume of blood received in culture bottles   Culture   Final    NO GROWTH < 12 HOURS Performed at [facility], [address] [place], [place] [address]    Report Status PENDING  Incomplete  Culture, blood (Routine X 2) w Reflex to ID Panel     Status: None (Preliminary result)   Collection Time: 06/11/19  7:30 PM   Specimen: BLOOD  Result Value Ref Range Status   Specimen Description BLOOD LEFT ANTECUBITAL  Final   Special Requests   Final    BOTTLES DRAWN AEROBIC  AND ANAEROBIC Blood Culture results may not be optimal due to an excessive volume of blood received in culture bottles   Culture   Final    NO GROWTH < 12 HOURS Performed at [facility], [address], [place], [place] [address]    Report Status PENDING  Incomplete      Radiology Studies: DG Chest Port 1 View  Result Date: 06/11/2019 CLINICAL DATA:  Shortness of breath.  COVID-19 positive EXAM: PORTABLE CHEST 1 VIEW COMPARISON:  November 20, 2018 FINDINGS: There is airspace consolidation in both mid and lower lung zones as well as in the right upper lobe. There is cardiomegaly with pulmonary vascularity normal. No adenopathy. Postoperative change noted in the lower cervical region. Thoracic stimulator lead tips are in the lower thoracic region. IMPRESSION: Multifocal airspace consolidation consistent with multifocal pneumonia. Stable cardiomegaly. No adenopathy evident. Electronically Signed   By: Lowella Grip III M.D.   On: 06/11/2019 12:49     Scheduled Meds: . buPROPion  100 mg Oral QPC breakfast  . dexamethasone (DECADRON) injection  6 mg Intravenous Q24H  . dextromethorphan-guaiFENesin  1 tablet Oral BID    . diclofenac sodium  4 g Topical TID  . dicyclomine  10 mg Oral TID  . donepezil  10 mg Oral QHS  . ferrous sulfate  325 mg Oral BID  . gabapentin  300 mg Oral 2 times per day  . gabapentin  600 mg Oral Daily  . hydrocortisone  1 application Topical Daily  . insulin aspart  0-9 Units Subcutaneous Q4H  . insulin glargine  15 Units Subcutaneous QPM  . ipratropium  2 puff Inhalation Q4H  . lactulose  10 g Oral Daily  . lamoTRIgine  150 mg Oral BID  . lidocaine  1 patch Transdermal Q24H  . nystatin   Topical QID  . pantoprazole  40 mg Oral Daily  . pregabalin  100 mg Oral QHS  . pregabalin  75 mg Oral 2 times per day  . rOPINIRole  3 mg Oral Daily  . sertraline  25 mg Oral Daily  . simvastatin  10 mg Oral QHS   Continuous Infusions: . remdesivir 100 mg in NS 100 mL 100 mg (06/12/19 0958)     LOS: 1 day     Enzo Bi, MD [facility] If 7PM-7AM, please contact night-coverage 06/12/2019, 2:56 PM

## 2019-06-12 NOTE — TOC Initial Note (Signed)
Transition of Care [facility]) - Initial/Assessment Note    Patient Details  Name: Stephanie [name] MRN: [id number] Date of Birth: 23-Oct-1950  [facility] CM/SW Contact:    Shelbie Hutching, RN Phone Number: 06/12/2019, 9:24 AM  Clinical Narrative:                 Patient admitted with COVID 19 on high flow [place] at 11L acute.  Patient is from [facility].  Patient reports that she is independent in ADL's.  [facility] provides her transportation.  RNCM will follow patient for discharge needs.   Expected Discharge Plan: Assisted Living     Patient Goals and CMS Choice        Expected Discharge Plan and Services Expected Discharge Plan: Assisted Living   Discharge Planning Services: CM Consult   Living arrangements for the past 2 months: [place]                                      Prior Living Arrangements/Services Living arrangements for the past 2 months: [place] Lives with:: Facility Resident Patient language and need for interpreter reviewed:: Yes Do you feel safe going back to the place where you live?: Yes      Need for Family Participation in Patient Care: Yes (Comment) Care giver support system in place?: Yes (comment)(Friends)   Criminal Activity/Legal Involvement Pertinent to Current Situation/Hospitalization: No - Comment as needed  Activities of Daily Living Home Assistive Devices/Equipment: Walker (specify type) ADL Screening (condition at time of admission) Patient's cognitive ability adequate to safely complete daily activities?: Yes Is the patient deaf or have difficulty hearing?: No Does the patient have difficulty seeing, even when wearing glasses/contacts?: No Does the patient have difficulty concentrating, remembering, or making decisions?: No Patient able to express need for assistance with ADLs?: Yes Does the patient have difficulty dressing or bathing?: No Independently performs ADLs?: Yes  (appropriate for developmental age) Does the patient have difficulty walking or climbing stairs?: No Weakness of Legs: Both Weakness of Arms/Hands: Both  Permission Sought/Granted Permission sought to share information with : Case Manager, Chartered certified accountant granted to share information with : Yes, Verbal Permission Granted     Permission granted to share info w AGENCY: [facility]        Emotional Assessment   Attitude/Demeanor/Rapport: Engaged Affect (typically observed): Accepting Orientation: : Oriented to Self, Oriented to Place, Oriented to  Time, Oriented to Situation Alcohol / Substance Use: Not Applicable Psych Involvement: No (comment)  Admission diagnosis:  Hypoxia [R09.02] Acute respiratory disease due to COVID-19 virus [U07.1, J06.9] COVID-19 [U07.1] Patient Active Problem List   Diagnosis Date Noted  . COPD (chronic obstructive pulmonary disease) ([place])   . Diabetes mellitus without complication ([place])   . Acute respiratory disease due to COVID-19 virus   . Exposure to communicable disease 06/04/2019  . 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 Cellulitis of right lower limb 05/08/2019  . Depressed mood 05/08/2019  . Diarrhea 05/08/2019  . Eczema 05/08/2019  . Localized edema 05/08/2019  . Melanocytic nevi, unspecified 05/08/2019  . New daily persistent headache 05/08/2019  . Peripheral venous insufficiency 05/08/2019  . Pruritus, unspecified 05/08/2019  . Gastritis without bleeding   . Personal history of colonic polyps   . Polyp of descending colon   . Polyp of ascending colon   . Cough 01/01/2019  .  Gastroesophageal reflux disease with esophagitis 01/01/2019  . Hypoglycemia due to type 2 diabetes mellitus ([place]) 01/01/2019  . Irritant contact dermatitis due to cosmetics 01/01/2019  . Long term (current) use of insulin ([place]) 01/01/2019  . Peripheral vascular disease ([place]) 01/01/2019  . Bipolar I disorder, most recent episode depressed ([place])  01/01/2019  . Bipolar 1 disorder, manic, moderate ([place]) 11/20/2018  . Tremor 10/03/2018  . Loss of memory 07/24/2018  . Numbness and tingling of both legs 07/24/2018  . Obesity (BMI 30-39.9) 07/19/2018  . Chronic diastolic CHF (congestive heart failure), NYHA class 2 ([place]) 07/17/2018  . Mild mitral regurgitation 07/05/2018  . Unsteady gait 04/09/2018  . Diabetic peripheral neuropathy associated with type 2 diabetes mellitus ([place]) 04/09/2018  . Laceration without foreign body, right lower leg, initial encounter 04/09/2018  . Chronic obstructive pulmonary disease, unspecified ([place]) 04/09/2018  . Unilateral primary osteoarthritis, right knee 04/09/2018  . Sleep apnea 04/09/2018  . Chronic hip pain, right 04/09/2018  . Closed impacted fracture of hip ([place]) 04/09/2018  . Tricompartment osteoarthritis of right knee 10/18/2017  . Sepsis ([place]) 07/20/2017  . Obesity, Class III, BMI 40-49.9 (morbid obesity) ([place]) 05/19/2017  . Cervical stenosis of spine 04/25/2017  . Stress fracture of femur 04/17/2017  . Elevated alkaline phosphatase level 04/16/2017  . Altered mental status 03/07/2017  . Degenerative tear of lateral meniscus of right knee 11/15/2016  . Encephalopathy 07/19/2016  . Degenerative tear of medial meniscus of right knee 07/01/2016  . Primary osteoarthritis of right knee 07/01/2016  . Diabetes mellitus type 1, uncontrolled, without complications 67/67/2094  . Lymphedema 06/14/2016  . Pain in limb 05/06/2016  . Swelling of limb 05/06/2016  . Postphlebitic syndrome with inflammation 05/06/2016  . Pneumonia 04/18/2016  . Chest pain 04/04/2016  . Iron deficiency anemia 01/30/2016  . Bilateral carotid artery stenosis 01/12/2016  . Hypoglycemia 09/09/2015  . Hyperglycemia 07/16/2015  . Neuropathy 06/08/2015  . Thrombocytopenia ([place]) 08/19/2014  . Abdominal pain, chronic, epigastric 06/12/2014  . Liver cirrhosis secondary to NASH ([place]) 02/27/2014  . Dizziness and giddiness  10/30/2013  . DOE (dyspnea on exertion) 10/30/2013  . Poorly controlled type 2 diabetes mellitus with complication ([place]) 70/96/2836  . DNR (do not resuscitate) 07/29/2013  . Encounter for routine gynecological examination 07/29/2013  . Dyspnea 07/10/2013  . Lung nodule 07/10/2013  . Varicose veins of left lower extremity 10/09/2012  . Gastroesophageal reflux disease 10/09/2012  . Lower extremity edema 02/02/2012  . Cirrhosis of liver ([place]) 02/02/2012  . GAD (generalized anxiety disorder) 07/11/2011  . Aortic dissection ([place])   . BACK PAIN, LUMBAR, CHRONIC 11/19/2009  . Chronic pain syndrome 11/06/2009  . UNS ADVRS EFF OTH RX MEDICINAL&BIOLOGICAL SBSTNC 12/16/2008  . Irritable bowel syndrome with constipation 10/23/2008  . UNSPECIFIED VITAMIN D DEFICIENCY 09/26/2008  . TOBACCO ABUSE 06/19/2008  . EDEMA, LOCALIZED 01/07/2008  . Restless legs syndrome 04/27/2007  . Insomnia due to mental disorder 04/27/2007  . Hyperlipidemia 03/08/2007  . OSA (obstructive sleep apnea) 03/08/2007  . Hypertension 03/08/2007  . Asthma 03/08/2007  . Urinary incontinence 03/08/2007  . BIPOLAR AFFECTIVE DISORDER, HX OF 03/08/2007   PCP:  Odessa Fleming, NP Pharmacy:   [place], [place] - [address] [place] [address] Phone: [phone] Fax: [phone]     Social Determinants of Health (SDOH) Interventions    Readmission Risk Interventions No flowsheet data found.

## 2019-06-12 NOTE — Consult Note (Addendum)
Remdesivir - Pharmacy Brief Note  Reported positive COVID test 06/07/2019 from [facility]  Day 2 of Remdesivir   O:  ALT: 16 CXR: Multifocal airspace consolidation consistent with multifocal pneumonia SpO2: 70% on room air   A/P:  Remdesivir 200 mg IVPB once followed by 100 mg IVPB daily x 4 days.   Pernell Dupre, PharmD, BCPS Clinical Pharmacist 06/12/2019 7:46 AM

## 2019-06-12 NOTE — Progress Notes (Signed)
Pt AM BP 82/36 MD made aware. Awaiting orders. Will continue to monitor.

## 2019-06-13 ENCOUNTER — Ambulatory Visit: Payer: Medicare Other | Admitting: Psychiatry

## 2019-06-13 LAB — CBC WITH DIFFERENTIAL/PLATELET
Abs Immature Granulocytes: 0.01 10*3/uL (ref 0.00–0.07)
Basophils Absolute: 0 10*3/uL (ref 0.0–0.1)
Basophils Relative: 0 %
Eosinophils Absolute: 0 10*3/uL (ref 0.0–0.5)
Eosinophils Relative: 0 %
HCT: 33.4 % — ABNORMAL LOW (ref 36.0–46.0)
Hemoglobin: 11 g/dL — ABNORMAL LOW (ref 12.0–15.0)
Immature Granulocytes: 1 %
Lymphocytes Relative: 11 %
Lymphs Abs: 0.2 10*3/uL — ABNORMAL LOW (ref 0.7–4.0)
MCH: 30.3 pg (ref 26.0–34.0)
MCHC: 32.9 g/dL (ref 30.0–36.0)
MCV: 92 fL (ref 80.0–100.0)
Monocytes Absolute: 0.3 10*3/uL (ref 0.1–1.0)
Monocytes Relative: 11 %
Neutro Abs: 1.7 10*3/uL (ref 1.7–7.7)
Neutrophils Relative %: 77 %
Platelets: 101 10*3/uL — ABNORMAL LOW (ref 150–400)
RBC: 3.63 MIL/uL — ABNORMAL LOW (ref 3.87–5.11)
RDW: 14.9 % (ref 11.5–15.5)
WBC: 2.2 10*3/uL — ABNORMAL LOW (ref 4.0–10.5)
nRBC: 0 % (ref 0.0–0.2)

## 2019-06-13 LAB — FERRITIN: Ferritin: 689 ng/mL — ABNORMAL HIGH (ref 11–307)

## 2019-06-13 LAB — COMPREHENSIVE METABOLIC PANEL
ALT: 15 U/L (ref 0–44)
AST: 42 U/L — ABNORMAL HIGH (ref 15–41)
Albumin: 2.9 g/dL — ABNORMAL LOW (ref 3.5–5.0)
Alkaline Phosphatase: 118 U/L (ref 38–126)
Anion gap: 8 (ref 5–15)
BUN: 20 mg/dL (ref 8–23)
CO2: 27 mmol/L (ref 22–32)
Calcium: 8.2 mg/dL — ABNORMAL LOW (ref 8.9–10.3)
Chloride: 104 mmol/L (ref 98–111)
Creatinine, Ser: 0.8 mg/dL (ref 0.44–1.00)
GFR calc Af Amer: >60 mL/min (ref >60)
GFR calc non Af Amer: >60 mL/min (ref >60)
Glucose, Bld: 182 mg/dL — ABNORMAL HIGH (ref 70–99)
Potassium: 3.8 mmol/L (ref 3.5–5.1)
Sodium: 139 mmol/L (ref 135–145)
Total Bilirubin: 1.1 mg/dL (ref 0.3–1.2)
Total Protein: 6.1 g/dL — ABNORMAL LOW (ref 6.5–8.1)

## 2019-06-13 LAB — GLUCOSE, CAPILLARY
Glucose-Capillary: 147 mg/dL — ABNORMAL HIGH (ref 70–99)
Glucose-Capillary: 159 mg/dL — ABNORMAL HIGH (ref 70–99)
Glucose-Capillary: 160 mg/dL — ABNORMAL HIGH (ref 70–99)
Glucose-Capillary: 171 mg/dL — ABNORMAL HIGH (ref 70–99)
Glucose-Capillary: 171 mg/dL — ABNORMAL HIGH (ref 70–99)
Glucose-Capillary: 174 mg/dL — ABNORMAL HIGH (ref 70–99)
Glucose-Capillary: 176 mg/dL — ABNORMAL HIGH (ref 70–99)
Glucose-Capillary: 177 mg/dL — ABNORMAL HIGH (ref 70–99)

## 2019-06-13 LAB — MAGNESIUM: Magnesium: 2.2 mg/dL (ref 1.7–2.4)

## 2019-06-13 LAB — FIBRIN DERIVATIVES D-DIMER (ARMC ONLY): Fibrin derivatives D-dimer (ARMC): 874.03 ng/mL (FEU) — ABNORMAL HIGH (ref 0.00–499.00)

## 2019-06-13 LAB — C-REACTIVE PROTEIN: CRP: 12.9 mg/dL — ABNORMAL HIGH (ref <1.0)

## 2019-06-13 MED ORDER — ALUM & MAG HYDROXIDE-SIMETH 200-200-20 MG/5ML PO SUSP
30.0000 mL | Freq: Once | ORAL | Status: AC
  Administered 2019-06-13: 30 mL via ORAL
  Filled 2019-06-13: qty 30

## 2019-06-13 MED ORDER — SUCRALFATE 1 GM/10ML PO SUSP
1.0000 g | Freq: Once | ORAL | Status: AC
  Administered 2019-06-13: 1 g via ORAL
  Filled 2019-06-13: qty 10

## 2019-06-13 MED ORDER — HYDROCODONE-ACETAMINOPHEN 5-325 MG PO TABS
1.0000 | ORAL_TABLET | Freq: Four times a day (QID) | ORAL | Status: DC | PRN
  Administered 2019-06-13 – 2019-06-14 (×3): 1 via ORAL
  Filled 2019-06-13 (×3): qty 1

## 2019-06-13 MED ORDER — LIDOCAINE VISCOUS HCL 2 % MT SOLN
15.0000 mL | Freq: Once | OROMUCOSAL | Status: AC
  Administered 2019-06-13: 15 mL via ORAL
  Filled 2019-06-13: qty 15

## 2019-06-13 NOTE — Progress Notes (Signed)
Contacted Stephanie [name] with status update. No questions at this time.

## 2019-06-13 NOTE — Progress Notes (Signed)
PROGRESS NOTE    Stephanie [name]  EHU:[id number] DOB: 03-15-1951 DOA: 06/11/2019 PCP: Odessa Fleming, NP    Assessment & Plan:   Principal Problem:   Acute respiratory disease due to COVID-19 virus Active Problems:   Hyperlipidemia   Hypertension   Cirrhosis of liver ([place])   Thrombocytopenia ([place])   Iron deficiency anemia   Chronic diastolic CHF (congestive heart failure), NYHA class 2 (HCC)   Bipolar I disorder, most recent episode depressed ([place])   COPD (chronic obstructive pulmonary disease) ([place])   Diabetes mellitus without complication ([place])    Stephanie [name] is a 69 y.o. Caucasian female with medical history significant of hypertension, hyperlipidemia, diabetes mellitus, COPD, asthma, stroke, GERD, depression with anxiety, thrombocytopenia, liver cirrhosis, hepatitis B 20 years ago, bipolar disorder, iron deficiency anemia, CHF, who presents with fever, cough shortness of breath.   Acute hypoxic respiratory failure due to COVID-19 PNA --In the ED, Patient has oxygen desaturation to 70% on room air, with bilateral multifocal infiltration on chest x-ray.  Remdesivir started, but steroid not started 2/2 reported allergy to cortisone. --O2 requirement went up to 12L on 1/6, so transferred to stepdown.  Currently on opti-flow. PLAN: --continue Remdesivir (06/11/19--) --continue IV decadron 6 mg daily (benefit outweigh the risk, and allergy unlikely to be real) -vitamin C, zinc.  -Atrovent inhaler, PRN albuterol inhaler -PRN Mucinex for cough  Hyperlipidemia: -zocor  Hypotension Hx of ypertension --BP remained low despite multiple boluses -Hold home HCTZ and Lasix --Hold home benzo --Resume home opioids with hold parameter for BP  Cirrhosis of liver ([place]):  Mental status normal -Continue lactulose  Thrombocytopenia ([place]):  Platelet 80.  No bleeding tendency.  Most likely due to liver cirrhosis  Iron deficiency anemia Continue iron supplement  Chronic  diastolic CHF  --NYHA class 2 ([place]): 2D echo on 07/16/2018 showed EF>55.  No leg edema.  No pulmonary edema chest x-ray PA CHF is compensated. -Hold Lasix due to softer blood pressure  Bipolar I disorder, most recent episode depressed (HCC) Anxiety -Continue home bupropion, sertraline, lamictal --Hold home Klonopin 2/2 low BP  COPD (chronic obstructive pulmonary disease) ([place]): -On bronchodilators  Diabetes mellitus without complication ([place]):  Last A1c 5.6 on 11/20/2018, well controled. Patient is taking Humalog, Victoza, Lantus at home -continue Lantus 15 unit daily -SSI  Chronic back pain on chronic opioids --resume home Norco with BP hold parameter --continue home gabapentin and Lyrica --d/c IV toradol 2/2 abdominal pain   DVT prophylaxis: Lovenox SQ Code Status: Full code  Family Communication: not today Disposition Plan: undetermined   Subjective and Interval History:  Pt complained of epigastric pain and diffuse abdominal pain today, that started last night.  Also had nausea.  Reported diarrhea, 1x per day.  No fever, chest pain, dysuria, increased swelling.   Objective: Vitals:   06/13/19 1400 06/13/19 1500 06/13/19 1600 06/13/19 1700  BP: (!) 104/50 (!) 119/51 (!) 116/47 (!) 116/54  Pulse: 62 65 60 60  Resp: (!) 21 (!) 21 (!) 23 (!) 21  Temp:   98.9 F (37.2 C)   TempSrc:   Oral   SpO2: 92% 90% 92% 91%  Weight:      Height:        Intake/Output Summary (Last 24 hours) at 06/13/2019 1801 Last data filed at 06/13/2019 1200 Gross per 24 hour  Intake 242.71 ml  Output 501 ml  Net -258.29 ml   Filed Weights   06/11/19 1218 06/11/19 2143 06/12/19 1800  Weight: 101.2  kg 101.6 kg 100 kg    Examination:   Constitutional: NAD, AAOx3 HEENT: conjunctivae and lids normal, EOMI CV: RRR no M,R,G. Distal pulses +2.  No cyanosis.   RESP: CTA B/L, on opti-flow GI: +BS, soft, ND, tender to palpation all over Extremities: No effusions, edema, or tenderness in  BLE SKIN: warm, dry and intact Neuro: II - XII grossly intact.  Sensation intact Psych: Depressed mood and affect.     Data Reviewed: I have personally reviewed following labs and imaging studies  CBC: Recent Labs  Lab 06/11/19 1236 06/12/19 0130 06/13/19 0235  WBC 2.1* 2.3* 2.2*  NEUTROABS 1.5* 1.8 1.7  HGB 10.8* 10.7* 11.0*  HCT 31.9* 32.5* 33.4*  MCV 90.4 91.0 92.0  PLT 80* 79* 882*   Basic Metabolic Panel: Recent Labs  Lab 06/11/19 1236 06/12/19 0130 06/13/19 0235  NA 132* 135 139  K 3.7 3.5 3.8  CL 97* 100 104  CO2 25 27 27   GLUCOSE 162* 149* 182*  BUN 16 13 20   CREATININE 1.08* 0.84 0.80  CALCIUM 8.1* 8.0* 8.2*  MG  --  2.1 2.2   GFR: Estimated Creatinine Clearance: 75.9 mL/min (by C-G formula based on SCr of 0.8 mg/dL). Liver Function Tests: Recent Labs  Lab 06/11/19 1236 06/12/19 0130 06/13/19 0235  AST 43* 41 42*  ALT 16 16 15   ALKPHOS 126 121 118  BILITOT 0.8 0.9 1.1  PROT 6.2* 6.2* 6.1*  ALBUMIN 3.2* 3.0* 2.9*   No results for input(s): LIPASE, AMYLASE in the last 168 hours. Recent Labs  Lab 06/11/19 1930  AMMONIA 13   Coagulation Profile: No results for input(s): INR, PROTIME in the last 168 hours. Cardiac Enzymes: No results for input(s): CKTOTAL, CKMB, CKMBINDEX, TROPONINI in the last 168 hours. BNP (last 3 results) No results for input(s): PROBNP in the last 8760 hours. HbA1C: No results for input(s): HGBA1C in the last 72 hours. CBG: Recent Labs  Lab 06/12/19 2338 06/13/19 0403 06/13/19 0803 06/13/19 1152 06/13/19 1604  GLUCAP 174* 160* 171* 171* 159*   Lipid Profile: Recent Labs    06/11/19 1236  TRIG 100   Thyroid Function Tests: No results for input(s): TSH, T4TOTAL, FREET4, T3FREE, THYROIDAB in the last 72 hours. Anemia Panel: Recent Labs    06/12/19 0130 06/13/19 0235  FERRITIN 530* 689*   Sepsis Labs: Recent Labs  Lab 06/11/19 1236 06/12/19 0030  PROCALCITON <0.10  --   LATICACIDVEN 1.4 1.1     Recent Results (from the past 240 hour(s))  Culture, blood (Routine X 2) w Reflex to ID Panel     Status: None (Preliminary result)   Collection Time: 06/11/19  7:00 PM   Specimen: BLOOD  Result Value Ref Range Status   Specimen Description BLOOD BLOOD LEFT ARM  Final   Special Requests   Final    BOTTLES DRAWN AEROBIC AND ANAEROBIC Blood Culture results may not be optimal due to an excessive volume of blood received in culture bottles   Culture   Final    NO GROWTH 2 DAYS Performed at [facility], [address], [place], [place] [address]    Report Status PENDING  Incomplete  Culture, blood (Routine X 2) w Reflex to ID Panel     Status: None (Preliminary result)   Collection Time: 06/11/19  7:30 PM   Specimen: BLOOD  Result Value Ref Range Status   Specimen Description BLOOD LEFT ANTECUBITAL  Final   Special Requests   Final  BOTTLES DRAWN AEROBIC AND ANAEROBIC Blood Culture results may not be optimal due to an excessive volume of blood received in culture bottles   Culture   Final    NO GROWTH 2 DAYS Performed at [facility], [address] [place], [place] [address]    Report Status PENDING  Incomplete  MRSA PCR Screening     Status: None   Collection Time: 06/12/19  6:05 PM   Specimen: Nasopharyngeal  Result Value Ref Range Status   MRSA by PCR NEGATIVE NEGATIVE Final    Comment:        The GeneXpert MRSA Assay (FDA approved for NASAL specimens only), is one component of a comprehensive MRSA colonization surveillance program. It is not intended to diagnose MRSA infection nor to guide or monitor treatment for MRSA infections. Performed at [facility], [address], [place], [place] [address]       Radiology Studies: No results found.   Scheduled Meds: . buPROPion  100 mg Oral QPC breakfast  . Chlorhexidine Gluconate Cloth  6 each Topical Daily  . dexamethasone (DECADRON) injection  6 mg Intravenous Q24H  .  dextromethorphan-guaiFENesin  1 tablet Oral BID  . diclofenac sodium  4 g Topical TID  . dicyclomine  10 mg Oral TID  . donepezil  10 mg Oral QHS  . enoxaparin (LOVENOX) injection  40 mg Subcutaneous Q24H  . ferrous sulfate  325 mg Oral BID  . gabapentin  300 mg Oral 2 times per day  . gabapentin  600 mg Oral Daily  . hydrocortisone  1 application Topical Daily  . insulin aspart  0-9 Units Subcutaneous Q4H  . insulin glargine  15 Units Subcutaneous QPM  . ipratropium  2 puff Inhalation Q4H  . lactulose  10 g Oral Daily  . lamoTRIgine  150 mg Oral BID  . lidocaine  1 patch Transdermal Q24H  . nystatin   Topical QID  . pantoprazole  40 mg Oral Daily  . pregabalin  100 mg Oral QHS  . pregabalin  75 mg Oral 2 times per day  . rOPINIRole  3 mg Oral Daily  . sertraline  25 mg Oral Daily  . simvastatin  10 mg Oral QHS   Continuous Infusions: . remdesivir 100 mg in NS 100 mL 200 mL/hr at 06/13/19 1200     LOS: 2 days     Enzo Bi, MD [facility] If 7PM-7AM, please contact night-coverage 06/13/2019, 6:01 PM

## 2019-06-14 ENCOUNTER — Inpatient Hospital Stay: Payer: Medicare Other

## 2019-06-14 LAB — BLOOD GAS, ARTERIAL
Acid-Base Excess: 2.9 mmol/L — ABNORMAL HIGH (ref 0.0–2.0)
Bicarbonate: 29.8 mmol/L — ABNORMAL HIGH (ref 20.0–28.0)
FIO2: 1
MECHVT: 450 mL
O2 Saturation: 93.7 %
PEEP: 5 cmH2O
Patient temperature: 37
RATE: 15 resp/min
pCO2 arterial: 54 mmHg — ABNORMAL HIGH (ref 32.0–48.0)
pH, Arterial: 7.35 (ref 7.350–7.450)
pO2, Arterial: 73 mmHg — ABNORMAL LOW (ref 83.0–108.0)

## 2019-06-14 LAB — COMPREHENSIVE METABOLIC PANEL
ALT: 12 U/L (ref 0–44)
AST: 37 U/L (ref 15–41)
Albumin: 2.8 g/dL — ABNORMAL LOW (ref 3.5–5.0)
Alkaline Phosphatase: 111 U/L (ref 38–126)
Anion gap: 8 (ref 5–15)
BUN: 29 mg/dL — ABNORMAL HIGH (ref 8–23)
CO2: 27 mmol/L (ref 22–32)
Calcium: 8.2 mg/dL — ABNORMAL LOW (ref 8.9–10.3)
Chloride: 106 mmol/L (ref 98–111)
Creatinine, Ser: 0.95 mg/dL (ref 0.44–1.00)
GFR calc Af Amer: >60 mL/min (ref >60)
GFR calc non Af Amer: >60 mL/min (ref >60)
Glucose, Bld: 166 mg/dL — ABNORMAL HIGH (ref 70–99)
Potassium: 3.8 mmol/L (ref 3.5–5.1)
Sodium: 141 mmol/L (ref 135–145)
Total Bilirubin: 1 mg/dL (ref 0.3–1.2)
Total Protein: 6.1 g/dL — ABNORMAL LOW (ref 6.5–8.1)

## 2019-06-14 LAB — CBC WITH DIFFERENTIAL/PLATELET
Abs Immature Granulocytes: 0.02 10*3/uL (ref 0.00–0.07)
Basophils Absolute: 0 10*3/uL (ref 0.0–0.1)
Basophils Relative: 0 %
Eosinophils Absolute: 0 10*3/uL (ref 0.0–0.5)
Eosinophils Relative: 0 %
HCT: 33.3 % — ABNORMAL LOW (ref 36.0–46.0)
Hemoglobin: 10.7 g/dL — ABNORMAL LOW (ref 12.0–15.0)
Immature Granulocytes: 1 %
Lymphocytes Relative: 6 %
Lymphs Abs: 0.3 10*3/uL — ABNORMAL LOW (ref 0.7–4.0)
MCH: 29.8 pg (ref 26.0–34.0)
MCHC: 32.1 g/dL (ref 30.0–36.0)
MCV: 92.8 fL (ref 80.0–100.0)
Monocytes Absolute: 0.4 10*3/uL (ref 0.1–1.0)
Monocytes Relative: 10 %
Neutro Abs: 3.5 10*3/uL (ref 1.7–7.7)
Neutrophils Relative %: 83 %
Platelets: 126 10*3/uL — ABNORMAL LOW (ref 150–400)
RBC: 3.59 MIL/uL — ABNORMAL LOW (ref 3.87–5.11)
RDW: 14.9 % (ref 11.5–15.5)
WBC: 4.2 10*3/uL (ref 4.0–10.5)
nRBC: 0 % (ref 0.0–0.2)

## 2019-06-14 LAB — FIBRIN DERIVATIVES D-DIMER (ARMC ONLY): Fibrin derivatives D-dimer (ARMC): 638.82 ng/mL (FEU) — ABNORMAL HIGH (ref 0.00–499.00)

## 2019-06-14 LAB — GLUCOSE, CAPILLARY
Glucose-Capillary: 156 mg/dL — ABNORMAL HIGH (ref 70–99)
Glucose-Capillary: 162 mg/dL — ABNORMAL HIGH (ref 70–99)
Glucose-Capillary: 167 mg/dL — ABNORMAL HIGH (ref 70–99)
Glucose-Capillary: 214 mg/dL — ABNORMAL HIGH (ref 70–99)
Glucose-Capillary: 296 mg/dL — ABNORMAL HIGH (ref 70–99)

## 2019-06-14 LAB — C-REACTIVE PROTEIN: CRP: 7.7 mg/dL — ABNORMAL HIGH (ref <1.0)

## 2019-06-14 LAB — FERRITIN: Ferritin: 621 ng/mL — ABNORMAL HIGH (ref 11–307)

## 2019-06-14 LAB — MAGNESIUM: Magnesium: 2.5 mg/dL — ABNORMAL HIGH (ref 1.7–2.4)

## 2019-06-14 MED ORDER — MIDAZOLAM HCL 2 MG/2ML IJ SOLN
INTRAMUSCULAR | Status: AC
  Filled 2019-06-14: qty 2

## 2019-06-14 MED ORDER — VECURONIUM BROMIDE 10 MG IV SOLR
INTRAVENOUS | Status: AC
  Filled 2019-06-14: qty 10

## 2019-06-14 MED ORDER — ORAL CARE MOUTH RINSE
15.0000 mL | OROMUCOSAL | Status: DC
  Administered 2019-06-14 – 2019-06-15 (×3): 15 mL via OROMUCOSAL

## 2019-06-14 MED ORDER — HYDROCHLOROTHIAZIDE 25 MG PO TABS: ORAL_TABLET | ORAL | Status: DC

## 2019-06-14 MED ORDER — LAMOTRIGINE 150 MG PO TABS: 150.0000 mg | ORAL_TABLET | Freq: Two times a day (BID) | ORAL | Status: DC

## 2019-06-14 MED ORDER — INSULIN GLARGINE 100 UNIT/ML ~~LOC~~ SOLN: 15.0000 [IU] | Freq: Every evening | SUBCUTANEOUS | Status: DC

## 2019-06-14 MED ORDER — BUPROPION HCL 75 MG PO TABS
75.0000 mg | ORAL_TABLET | Freq: Two times a day (BID) | ORAL | Status: DC
  Filled 2019-06-14: qty 1

## 2019-06-14 MED ORDER — VITAL HIGH PROTEIN PO LIQD: 1000.0000 mL | ORAL | Status: DC

## 2019-06-14 MED ORDER — ONDANSETRON HCL 4 MG PO TABS: 4.0000 mg | ORAL_TABLET | Freq: Three times a day (TID) | ORAL | 0 refills | Status: DC | PRN

## 2019-06-14 MED ORDER — FERROUS SULFATE 220 (44 FE) MG/5ML PO ELIX
220.0000 mg | ORAL_SOLUTION | Freq: Every day | ORAL | Status: DC
  Filled 2019-06-14: qty 5

## 2019-06-14 MED ORDER — PANTOPRAZOLE SODIUM 40 MG IV SOLR
40.0000 mg | Freq: Two times a day (BID) | INTRAVENOUS | Status: DC
  Administered 2019-06-14: 40 mg via INTRAVENOUS
  Filled 2019-06-14: qty 40

## 2019-06-14 MED ORDER — ZINC SULFATE 220 (50 ZN) MG PO CAPS: 220.0000 mg | ORAL_CAPSULE | Freq: Every day | ORAL | Status: DC

## 2019-06-14 MED ORDER — DICYCLOMINE HCL 10 MG PO CAPS
10.0000 mg | ORAL_CAPSULE | Freq: Three times a day (TID) | ORAL | Status: DC
  Administered 2019-06-14: 10 mg
  Filled 2019-06-14 (×2): qty 1

## 2019-06-14 MED ORDER — FUROSEMIDE 40 MG PO TABS: ORAL_TABLET | ORAL | Status: DC

## 2019-06-14 MED ORDER — FENTANYL 2500MCG IN NS 250ML (10MCG/ML) PREMIX INFUSION: 0.0000 ug/h | INTRAVENOUS | Status: DC

## 2019-06-14 MED ORDER — COLCHICINE 0.6 MG PO TABS
0.6000 mg | ORAL_TABLET | Freq: Every day | ORAL | Status: DC
  Administered 2019-06-14: 0.6 mg
  Filled 2019-06-14 (×2): qty 1

## 2019-06-14 MED ORDER — FENTANYL CITRATE (PF) 100 MCG/2ML IJ SOLN
100.0000 ug | Freq: Once | INTRAMUSCULAR | Status: AC
  Administered 2019-06-14: 100 ug via INTRAVENOUS

## 2019-06-14 MED ORDER — ENOXAPARIN SODIUM 40 MG/0.4ML ~~LOC~~ SOLN: 40.0000 mg | SUBCUTANEOUS | Status: DC

## 2019-06-14 MED ORDER — NOREPINEPHRINE 4 MG/250ML-% IV SOLN: 0.0000 ug/min | INTRAVENOUS | Status: DC

## 2019-06-14 MED ORDER — VITAL HIGH PROTEIN PO LIQD
1000.0000 mL | ORAL | Status: DC
  Administered 2019-06-15: 1000 mL

## 2019-06-14 MED ORDER — MIDAZOLAM HCL 2 MG/2ML IJ SOLN
2.0000 mg | Freq: Once | INTRAMUSCULAR | Status: AC
  Administered 2019-06-14: 2 mg via INTRAVENOUS

## 2019-06-14 MED ORDER — FENTANYL CITRATE (PF) 100 MCG/2ML IJ SOLN
INTRAMUSCULAR | Status: AC
  Filled 2019-06-14: qty 2

## 2019-06-14 MED ORDER — PRO-STAT SUGAR FREE PO LIQD
30.0000 mL | Freq: Two times a day (BID) | ORAL | Status: DC
  Administered 2019-06-14: 30 mL

## 2019-06-14 MED ORDER — VECURONIUM BROMIDE 10 MG IV SOLR
10.0000 mg | Freq: Once | INTRAVENOUS | Status: AC
  Administered 2019-06-14: 17:00:00 10 mg via INTRAVENOUS

## 2019-06-14 MED ORDER — PANTOPRAZOLE SODIUM 40 MG IV SOLR: 40.0000 mg | Freq: Two times a day (BID) | INTRAVENOUS | Status: DC

## 2019-06-14 MED ORDER — LIRAGLUTIDE 18 MG/3ML ~~LOC~~ SOPN: PEN_INJECTOR | SUBCUTANEOUS | Status: DC

## 2019-06-14 MED ORDER — B COMPLEX-C PO TABS
1.0000 | ORAL_TABLET | Freq: Every day | ORAL | Status: DC
  Administered 2019-06-14: 1
  Filled 2019-06-14 (×2): qty 1

## 2019-06-14 MED ORDER — CLONAZEPAM 0.25 MG PO TBDP: ORAL_TABLET | ORAL | 0 refills | Status: DC

## 2019-06-14 MED ORDER — METHYLPREDNISOLONE SODIUM SUCC 40 MG IJ SOLR: 40.0000 mg | Freq: Three times a day (TID) | INTRAMUSCULAR | 0 refills | Status: DC

## 2019-06-14 MED ORDER — ASCORBIC ACID 500 MG PO TABS
500.0000 mg | ORAL_TABLET | Freq: Two times a day (BID) | ORAL | Status: DC
  Administered 2019-06-14: 500 mg
  Filled 2019-06-14: qty 1

## 2019-06-14 MED ORDER — FENTANYL 2500MCG IN NS 250ML (10MCG/ML) PREMIX INFUSION
0.0000 ug/h | INTRAVENOUS | Status: DC
  Administered 2019-06-14: 200 ug/h via INTRAVENOUS
  Administered 2019-06-15: 300 ug/h via INTRAVENOUS
  Filled 2019-06-14 (×4): qty 250

## 2019-06-14 MED ORDER — ASCORBIC ACID 500 MG PO TABS: 500.0000 mg | ORAL_TABLET | Freq: Two times a day (BID) | ORAL | Status: DC

## 2019-06-14 MED ORDER — COLCHICINE 0.6 MG PO TABS: 0.6000 mg | ORAL_TABLET | Freq: Every day | ORAL | Status: DC

## 2019-06-14 MED ORDER — INSULIN LISPRO 100 UNIT/ML ~~LOC~~ SOLN: SUBCUTANEOUS | 11 refills | Status: DC

## 2019-06-14 MED ORDER — IPRATROPIUM BROMIDE HFA 17 MCG/ACT IN AERS: 2.0000 | INHALATION_SPRAY | RESPIRATORY_TRACT | Status: DC

## 2019-06-14 MED ORDER — GABAPENTIN 300 MG PO CAPS: 300.0000 mg | ORAL_CAPSULE | Freq: Three times a day (TID) | ORAL | Status: DC

## 2019-06-14 MED ORDER — THIAMINE HCL 100 MG PO TABS: 100.0000 mg | ORAL_TABLET | Freq: Every day | ORAL | Status: DC

## 2019-06-14 MED ORDER — PRO-STAT SUGAR FREE PO LIQD: 30.0000 mL | Freq: Two times a day (BID) | ORAL | 0 refills | Status: DC

## 2019-06-14 MED ORDER — METHYLPREDNISOLONE SODIUM SUCC 40 MG IJ SOLR: 40.0000 mg | Freq: Two times a day (BID) | INTRAMUSCULAR | Status: DC

## 2019-06-14 MED ORDER — ZINC SULFATE 220 (50 ZN) MG PO CAPS
220.0000 mg | ORAL_CAPSULE | Freq: Every day | ORAL | Status: DC
  Administered 2019-06-14: 220 mg
  Filled 2019-06-14: qty 1

## 2019-06-14 MED ORDER — DM-GUAIFENESIN ER 30-600 MG PO TB12: 1.0000 | ORAL_TABLET | Freq: Two times a day (BID) | ORAL | Status: DC

## 2019-06-14 MED ORDER — DONEPEZIL HCL 5 MG PO TABS
10.0000 mg | ORAL_TABLET | Freq: Every day | ORAL | Status: DC
  Administered 2019-06-14: 10 mg
  Filled 2019-06-14: qty 2

## 2019-06-14 MED ORDER — INSULIN ASPART 100 UNIT/ML ~~LOC~~ SOLN: 0.0000 [IU] | Freq: Three times a day (TID) | SUBCUTANEOUS | Status: DC

## 2019-06-14 MED ORDER — B COMPLEX-C PO TABS: 1.0000 | ORAL_TABLET | Freq: Every day | ORAL | Status: DC

## 2019-06-14 MED ORDER — LAMOTRIGINE 25 MG PO TABS
150.0000 mg | ORAL_TABLET | Freq: Two times a day (BID) | ORAL | Status: DC
  Administered 2019-06-14: 150 mg
  Filled 2019-06-14: qty 6

## 2019-06-14 MED ORDER — MIDAZOLAM HCL 2 MG/2ML IJ SOLN
2.0000 mg | INTRAMUSCULAR | Status: DC | PRN
  Administered 2019-06-14 – 2019-06-15 (×2): 2 mg via INTRAVENOUS
  Filled 2019-06-14 (×2): qty 2

## 2019-06-14 MED ORDER — METHYLPREDNISOLONE SODIUM SUCC 40 MG IJ SOLR
40.0000 mg | Freq: Three times a day (TID) | INTRAMUSCULAR | Status: DC
  Administered 2019-06-14 (×2): 40 mg via INTRAVENOUS
  Filled 2019-06-14 (×2): qty 1

## 2019-06-14 MED ORDER — METHYLPREDNISOLONE SODIUM SUCC 40 MG IJ SOLR: 40.0000 mg | Freq: Two times a day (BID) | INTRAMUSCULAR | 0 refills | Status: DC

## 2019-06-14 MED ORDER — THIAMINE HCL 100 MG PO TABS
100.0000 mg | ORAL_TABLET | Freq: Every day | ORAL | Status: DC
  Administered 2019-06-14: 100 mg
  Filled 2019-06-14: qty 1

## 2019-06-14 MED ORDER — SERTRALINE HCL 50 MG PO TABS: 25.0000 mg | ORAL_TABLET | Freq: Every day | ORAL | Status: DC

## 2019-06-14 MED ORDER — NYSTATIN 100000 UNIT/GM EX POWD: Freq: Four times a day (QID) | CUTANEOUS | 0 refills | Status: DC | PRN

## 2019-06-14 MED ORDER — CHLORHEXIDINE GLUCONATE 0.12% ORAL RINSE (MEDLINE KIT)
15.0000 mL | Freq: Two times a day (BID) | OROMUCOSAL | Status: DC
  Administered 2019-06-14: 15 mL via OROMUCOSAL

## 2019-06-14 MED ORDER — NOREPINEPHRINE 4 MG/250ML-% IV SOLN
0.0000 ug/min | INTRAVENOUS | Status: DC
  Administered 2019-06-14: 3 ug/min via INTRAVENOUS
  Filled 2019-06-14 (×2): qty 250

## 2019-06-14 NOTE — Progress Notes (Signed)
Per Dr. Mortimer Fries, the ETT is in good position.

## 2019-06-14 NOTE — Procedures (Signed)
Central Venous Catheter Placement:TRIPLE LUMEN Indication: Patient receiving vesicant or irritant drug.; Patient receiving intravenous therapy for longer than 5 days.; Patient has limited or no vascular access.   Consent:emergent    Hand washing performed prior to starting the procedure.   Procedure:   An active timeout was performed and correct patient, name, & ID confirmed.   Patient was positioned correctly for central venous access.  Patient was prepped using strict sterile technique including chlorohexadine preps, sterile drape, sterile gown and sterile gloves.    The area was prepped, draped and anesthetized in the usual sterile manner. Patient comfort was obtained.    A triple lumen catheter was placed in LEFT Internal Jugular Vein There was good blood return, catheter caps were placed on lumens, catheter flushed easily, the line was secured and a sterile dressing and BIO-PATCH applied.   Ultrasound was used to visualize vasculature and guidance of needle.   Number of Attempts: 1 Complications:none Estimated Blood Loss: none Chest Radiograph indicated and ordered.  Operator: Alasia Enge.   Corrin Parker, M.D.  [facility]  Medical Director [place] Director [facility] Cardio-Pulmonary Department

## 2019-06-14 NOTE — Procedures (Signed)
Endotracheal Intubation: Patient required placement of an artificial airway secondary to Respiratory Failure  Consent: Emergent.   Hand washing performed prior to starting the procedure.   Medications administered for sedation prior to procedure:  Midazolam 4 mg IV,  Vecuronium 10 mg IV, Fentanyl 100 mcg IV.    A time out procedure was called and correct patient, name, & ID confirmed. Needed supplies and equipment were assembled and checked to include ETT, 10 ml syringe, Glidescope, Mac and Miller blades, suction, oxygen and bag mask valve, end tidal CO2 monitor.   Patient was positioned to align the mouth and pharynx to facilitate visualization of the glottis.   Heart rate, SpO2 and blood pressure was continuously monitored during the procedure. Pre-oxygenation was conducted prior to intubation and endotracheal tube was placed through the vocal cords into the trachea.     The artificial airway was placed under direct visualization via glidescope route using a 8.0 ETT on the first attempt.  ETT was secured at 23 cm mark.  Placement was confirmed by auscuitation of lungs with good breath sounds bilaterally and no stomach sounds.  Condensation was noted on endotracheal tube.   Pulse ox 98%.  CO2 detector in place with appropriate color change.   Complications: None .   Operator: Murel Shenberger.   Chest radiograph ordered and pending.   Comments: OGT placed via glidescope.  Aaidyn [place] Amiaya Mcneeley, M.D.  [facility]  Medical Director ICU-[facility] Medical Director [facility] Cardio-Pulmonary Department       

## 2019-06-14 NOTE — Progress Notes (Signed)
EMS transport arrived to take pt to [facility]. Pt had been on heated HFNC at 50L and NRB at 15L combined, as has been since arrival to unit, with sats in high 80's. MD aware. Upon transport arrival, transporters stated they were unable to accommodate for heated HFNC and was removed while monitoring patient. Pt unable to maintain sats without HFNC in place. NRB remained, however, sats dropped to 50's and sustained. Mortimer Fries, MD notified. Charge RN, primary RN, Mortimer Fries, MD and RT arrived at bedside. Pt intubated by MD. OG, TLC and foley placed per protocol. No ICU beds available at [facility] at this time. Pt to be transferred upon bed availability. Fentanyl gtt and tube feeding ordered.

## 2019-06-14 NOTE — Consult Note (Signed)
Name: Stephanie [name] MRN: [id number] DOB: 01/02/1951     CONSULTATION DATE: 06/11/2019  REFERRING MD : Augustin Coupe  CHIEF COMPLAINT:  Severe resp failure  HISTORY OF PRESENT ILLNESS:  69 y.o. Caucasianfemalewith medical history significant ofhypertension, hyperlipidemia, diabetes mellitus, COPD, asthma, stroke, GERD, depression with anxiety, thrombocytopenia, liver cirrhosis, hepatitis B 20 years ago, bipolar disorder, iron deficiency anemia, CHF, who presents with fever, cough shortness of breath.   Admitted for Acute hypoxic respiratory failure due to COVID-19 PNA --In the ED, Patient has oxygen desaturation to 70% on room air, with bilateral multifocal infiltration on chest x-ray.  Remdesivir started, but steroid not started on admission 2/2 reported allergy to cortisone.  IV decadron started on 1/6 which pt tolerated.  In least 24 hrs, patient with worsening hypoxia and increased WOB Patient was emergently intubated CVL placed  PAST MEDICAL HISTORY :   has a past medical history of Adenomatous colon polyp (08/27/2014), Anemia, Anxiety, Aortic dissection (HCC), Arthritis, Asthma, Bipolar affective disorder ([place]), Blood transfusion (2000), Blood transfusion without reported diagnosis, Cancer ([place]), Chronic abdominal pain, Cirrhosis ([place]), Colon polyp, COPD (chronic obstructive pulmonary disease) ([place]), Depression, Diabetes mellitus without complication ([place]), Fatty liver (04/09/08), GERD (gastroesophageal reflux disease), H/O: CVA (cardiovascular accident), H/O: rheumatic fever, Hepatitis, Hyperlipidemia, Hypertension, IBS (irritable bowel syndrome), Incontinence, Insomnia, Lower extremity edema, Neuropathy, Obstructive sleep apnea, Platelets decreased ([place]), RLS (restless legs syndrome), Shortness of breath, Sleep apnea, Stroke ([place]), Ulcer, Unspecified disorders of nervous system, and Wears dentures.  has a past surgical history that includes Cholecystectomy; Rotator cuff repair; Abdominal  hysterectomy; Lumbar fusion (10/09); Hemorroidectomy; Abdominal exploration surgery; Posterior cervical fusion/foraminotomy; Lumbar wound debridement (05/09/2011); Axillary artery cannulation via 8-mm Hemashield graft, median sternotomy, extracorporeal circulation with deep hypothermic circulatory arrest, repair of  aortic dessection (01/23/2009); cataract; Colonoscopy; Liver biopsy (04/10/2012); Back surgery; Eye surgery (Bilateral); Knee arthroscopy with lateral release (Right, 11/15/2016); Facial laceration repair (Left, 04/11/2018); Colonoscopy with propofol (N/A, 02/26/2019); and Esophagogastroduodenoscopy (egd) with propofol (02/26/2019). Prior to Admission medications   Medication Sig Start Date End Date Taking? Authorizing Provider  albuterol (VENTOLIN HFA) 108 (90 Base) MCG/ACT inhaler Inhale 2 puffs into the lungs every 6 (six) hours as needed for wheezing or shortness of breath. 04/22/19  Yes Chesley Mires, MD  albuterol (VENTOLIN HFA) 108 (90 Base) MCG/ACT inhaler Inhale 2 puffs into the lungs every 6 (six) hours as needed for wheezing or shortness of breath.   Yes [provider]  buPROPion (WELLBUTRIN SR) 100 MG 12 hr tablet Take 1 tablet (100 mg total) by mouth daily after breakfast. 05/08/19  Yes Eappen, Saramma, MD  diclofenac sodium (VOLTAREN) 1 % GEL Apply 4 g topically 3 (three) times daily. knees   Yes [provider]  dicyclomine (BENTYL) 10 MG capsule Take 10 mg by mouth 3 (three) times daily.    Yes [provider]  donepezil (ARICEPT) 10 MG tablet Take 10 mg by mouth at bedtime.  11/29/18  Yes [provider]  ferrous sulfate 325 (65 FE) MG tablet Take 325 mg by mouth 2 (two) times daily.    Yes [provider]  HYDROcodone-acetaminophen (NORCO) 7.5-325 MG tablet Take 1 tablet by mouth 5 (five) times daily.    Yes [provider]  hydrocortisone 2.5 % cream Apply 1 application topically daily. To back of neck   Yes [provider]  hydrOXYzine (ATARAX/VISTARIL) 10 MG tablet Take 10 mg by mouth every 8 (eight) hours as needed for itching.  03/22/19  Yes  [provider]  lactulose (CHRONULAC) 10 GM/15ML solution Take 10 g by mouth daily. (0800) 10/17/16  Yes [provider]  lamoTRIgine (LAMICTAL) 150 MG tablet Take 1 tablet (150 mg total) by mouth 2 (two) times daily. 05/08/19  Yes Eappen, Ria Clock, MD  lidocaine (LIDODERM) 5 % Place 1 patch onto the skin daily.    Yes [provider]  loperamide (IMODIUM) 2 MG capsule Take 4 mg by mouth as needed for diarrhea or loose stools.   Yes [provider]  metoCLOPramide (REGLAN) 5 MG tablet Take 5 mg by mouth 3 (three) times daily before meals.    Yes [provider]  omeprazole (PRILOSEC) 20 MG capsule Take 20 mg by mouth daily.    Yes [provider]  potassium chloride SA (K-DUR) 20 MEQ tablet Take 20 mEq by mouth daily.  12/27/18  Yes [provider]  pregabalin (LYRICA) 100 MG capsule Take 100 mg by mouth at bedtime.  04/30/19  Yes [provider]  pregabalin (LYRICA) 75 MG capsule Take 75 mg by mouth 2 (two) times daily.  02/11/19  Yes [provider]  RESTASIS 0.05 % ophthalmic emulsion Place 1 drop into both eyes every 12 (twelve) hours as needed (dry eyes).  12/22/18  Yes [provider]  rOPINIRole (REQUIP) 3 MG tablet Take 3 mg by mouth at bedtime. (2000)   Yes [provider]  senna (GERI-KOT) 8.6 MG tablet Take 2 tablets by mouth at bedtime.    Yes [provider]  sertraline (ZOLOFT) 25 MG tablet Take 1 tablet (25 mg total) by mouth daily. 05/08/19  Yes Ursula Alert, MD  simvastatin (ZOCOR) 10 MG tablet Take 10 mg by mouth at bedtime.   Yes [provider]  clonazePAM (KLONOPIN) 0.25 MG disintegrating tablet Hold due to low blood pressure. 06/14/19   Enzo Bi, MD  dextromethorphan-guaiFENesin [facility] DM) 30-600 MG 12hr tablet Take 1 tablet by mouth 2 (two)  times daily. 06/14/19   Enzo Bi, MD  enoxaparin (LOVENOX) 40 MG/0.4ML injection Inject 0.4 mLs (40 mg total) into the skin daily. 06/14/19   Enzo Bi, MD  furosemide (LASIX) 40 MG tablet Take 40 mg by mouth 2 (two) times daily. 11/16/15 11/23/15  [provider]  furosemide (LASIX) 40 MG tablet Hold due to low blood pressure. 06/14/19   Enzo Bi, MD  gabapentin (NEURONTIN) 300 MG capsule Take 1-2 capsules (300-600 mg total) by mouth 3 (three) times daily. Takes 300 mg at 8 am , 2pm, Taking 600 mg 8 pm. 06/14/19   Enzo Bi, MD  hydrochlorothiazide (HYDRODIURIL) 25 MG tablet Hold due to low blood pressure. 06/14/19   Enzo Bi, MD  insulin aspart (NOVOLOG) 100 UNIT/ML injection Inject 0-9 Units into the skin 3 (three) times daily with meals. 06/14/19   Enzo Bi, MD  insulin glargine (LANTUS) 100 UNIT/ML injection Inject 0.15 mLs (15 Units total) into the skin every evening. 06/14/19   Enzo Bi, MD  insulin lispro (HUMALOG) 100 UNIT/ML injection Hold while inpatient. 06/14/19   Enzo Bi, MD  ipratropium (ATROVENT HFA) 17 MCG/ACT inhaler Inhale 2 puffs into the lungs every 4 (four) hours. 06/14/19   Enzo Bi, MD  liraglutide ([place]) 18 MG/3ML SOPN Hold while inpatient. 06/14/19   Enzo Bi, MD  methylPREDNISolone sodium succinate (SOLU-MEDROL) 40 mg/mL injection Inject 1 mL (40 mg total) into the vein every 8 (eight) hours. 06/14/19   Enzo Bi, MD  norepinephrine (LEVOPHED) 4-5 MG/250ML-% SOLN Inject 0-40 mcg/min  into the vein continuous. To keep systolic >803. 07/07/20   Enzo Bi, MD  nystatin (MYCOSTATIN/NYSTOP) powder Apply topically 4 (four) times daily as needed. 06/14/19   Enzo Bi, MD  ondansetron (ZOFRAN) 4 MG tablet Take 1 tablet (4 mg total) by mouth 3 (three) times daily with meals as needed for nausea. 06/14/19   Enzo Bi, MD  pantoprazole (PROTONIX) 40 MG injection Inject 40 mg into the vein every 12 (twelve) hours. 06/14/19   Enzo Bi, MD   Allergies  Allergen Reactions  . Cortisone      Increased BS  . Doxycycline Hives, Swelling, Other (See Comments) and Nausea Only    Reaction:  Facial swelling  Face red and swollen; no difficulty breathing  . Morphine Other (See Comments)    FAMILY HISTORY:  family history includes Anxiety disorder in her brother; Breast cancer in her mother; Depression in her brother; Diabetes in an other family member; Lung cancer in her mother; Stomach cancer in her father. SOCIAL HISTORY:  reports that she quit smoking about 6 years ago. Her smoking use included cigarettes. She has a 75.00 pack-year smoking history. She has never used smokeless tobacco. She reports that she does not drink alcohol or use drugs.  REVIEW OF SYSTEMS:   Unable to obtain due to critical illness   VITAL SIGNS: Temp:  [97 F (36.1 C)-97.8 F (36.6 C)] 97.8 F (36.6 C) (01/08 1430) Pulse Rate:  [57-73] 69 (01/08 1500) Resp:  [18-33] 21 (01/08 1500) BP: (82-132)/(39-97) 108/66 (01/08 1500) SpO2:  [81 %-93 %] 87 % (01/08 1500) FiO2 (%):  [100 %] 100 % (01/08 1430)   I/O last 3 completed shifts: In: 399.9 [P.O.:300; IV Piggyback:99.9] Out: 701 [Urine:700; Stool:1] Total I/O In: 580 [P.O.:480; IV Piggyback:100] Out: 0    SpO2: (!) 87 % O2 Flow Rate (L/min): 50 L/min FiO2 (%): 100 %  FiO2 (%):  [100 %] 100 %  Physical Examination:  GENERAL:critically ill appearing, +resp distress HEAD: Normocephalic, atraumatic.  EYES: Pupils equal, round, reactive to light.  No scleral icterus.  MOUTH: Moist mucosal membrane. NECK: Supple. No JVD.  PULMONARY: +rhonchi, +wheezing CARDIOVASCULAR: S1 and S2. Regular rate and rhythm. No murmurs, rubs, or gallops.  GASTROINTESTINAL: Soft, nontender, -distended.  Positive bowel sounds.  MUSCULOSKELETAL: No swelling, clubbing, or edema.  NEUROLOGIC: obtunded SKIN:intact,warm,dry  I personally reviewed lab work that was obtained in last 24 hrs. CXR Independently reviewed-progressive b/l infiltrates  MEDICATIONS: I have  reviewed all medications and confirmed regimen as documented   CULTURE RESULTS   Recent Results (from the past 240 hour(s))  Culture, blood (Routine X 2) w Reflex to ID Panel     Status: None (Preliminary result)   Collection Time: 06/11/19  7:00 PM   Specimen: BLOOD  Result Value Ref Range Status   Specimen Description BLOOD BLOOD LEFT ARM  Final   Special Requests   Final    BOTTLES DRAWN AEROBIC AND ANAEROBIC Blood Culture results may not be optimal due to an excessive volume of blood received in culture bottles   Culture   Final    NO GROWTH 3 DAYS Performed at [facility], [address] [place], [place] [address]    Report Status PENDING  Incomplete  Culture, blood (Routine X 2) w Reflex to ID Panel     Status: None (Preliminary result)   Collection Time: 06/11/19  7:30 PM   Specimen: BLOOD  Result Value Ref Range Status   Specimen Description BLOOD LEFT  ANTECUBITAL  Final   Special Requests   Final    BOTTLES DRAWN AEROBIC AND ANAEROBIC Blood Culture results may not be optimal due to an excessive volume of blood received in culture bottles   Culture   Final    NO GROWTH 3 DAYS Performed at [facility], [address] [place], [place] [address]    Report Status PENDING  Incomplete  MRSA PCR Screening     Status: None   Collection Time: 06/12/19  6:05 PM   Specimen: Nasopharyngeal  Result Value Ref Range Status   MRSA by PCR NEGATIVE NEGATIVE Final    Comment:        The GeneXpert MRSA Assay (FDA approved for NASAL specimens only), is one component of a comprehensive MRSA colonization surveillance program. It is not intended to diagnose MRSA infection nor to guide or monitor treatment for MRSA infections. Performed at [facility], [address] [place], [place] [address]            Indwelling Urinary Catheter continued, requirement due to   Reason to continue Indwelling Urinary Catheter strict Intake/Output monitoring for  hemodynamic instability   Central Line/ continued, requirement due to  Reason to continue [place] of central venous pressure or other hemodynamic parameters and poor IV access   Ventilator continued, requirement due to severe respiratory failure   Ventilator Sedation RASS 0 to -2      ASSESSMENT AND PLAN SYNOPSIS   Severe ACUTE Hypoxic and Hypercapnic Respiratory Failure from COVID 19 pneumonia with ARSA -continue Full MV support -continue Bronchodilator Therapy -Wean Fio2 and PEEP as tolerated   NEUROLOGY - intubated and sedated - minimal sedation to achieve a RASS goal: -1   SHOCK-SEPSIS/HYPOVOLUMIC -use vasopressors to keep MAP>65 -follow ABG and LA -follow up cultures -emperic ABX  Severe COVID-19 infection, ARDS and pneumonia/pneumonitis Continue IV steroids  IV remdisivir Continue proning as tolerated due to severe hypoxia   Maintain airborne and contact precautions  As needed bronchodilators (MDI) Vitamin C and zinc Antitussives High risk for death   CARDIAC ICU monitoring  ID -continue IV abx as prescibed -follow up cultures  GI GI PROPHYLAXIS as indicated  NUTRITIONAL STATUS DIET-->TF's as tolerated Constipation protocol as indicated   ENDO - will use ICU hypoglycemic\Hyperglycemia protocol if needed    ELECTROLYTES -follow labs as needed -replace as needed -pharmacy consultation and following   DVT/GI PRX ordered TRANSFUSIONS AS NEEDED MONITOR FSBS ASSESS the need for LABS    Critical Care Time devoted to patient care services described in this note is 45 minutes.   Overall, patient is critically ill, prognosis is guarded.  Patient with Multiorgan failure and at high risk for cardiac arrest and death.    Corrin Parker, M.D.  [facility]  Medical Director [place] Director [facility] Cardio-Pulmonary Department

## 2019-06-14 NOTE — Discharge Summary (Addendum)
DISCHARGE SUMMARY / PROGRESS NOTE    Stephanie [name]  JME:[id number] DOB: 1950/10/01 DOA: 06/11/2019 PCP: Odessa Fleming, NP   Admit date: 06/11/2019 Discharge date: 06/14/2019  Admitted From: home Disposition:  [place]: Full code   Assessment & Plan:   Principal Problem:   Acute respiratory disease due to COVID-19 virus Active Problems:   Hyperlipidemia   Hypertension   Cirrhosis of liver ([place])   Thrombocytopenia ([place])   Iron deficiency anemia   Chronic diastolic CHF (congestive heart failure), NYHA class 2 (HCC)   Bipolar I disorder, most recent episode depressed ([place])   COPD (chronic obstructive pulmonary disease) ([place])   Diabetes mellitus without complication ([place])    Stephanie [name] is a 69 y.o. Caucasian female with medical history significant of hypertension, hyperlipidemia, diabetes mellitus, COPD, asthma, stroke, GERD, depression with anxiety, thrombocytopenia, liver cirrhosis, hepatitis B 20 years ago, bipolar disorder, iron deficiency anemia, CHF, who presents with fever, cough shortness of breath.   Acute hypoxic respiratory failure due to COVID-19 PNA --In the ED, Patient has oxygen desaturation to 70% on room air, with bilateral multifocal infiltration on chest x-ray.  Remdesivir started, but steroid not started on admission 2/2 reported allergy to cortisone.  IV decadron started on 1/6 which pt tolerated. --O2 requirement went up to 12L on 06/12/19, so transferred to stepdown.  Currently on opti-flow. PLAN: --continue Remdesivir (06/11/19--) --Increase steroid to solu-medrol 40 mg q8h today since pt's CRP was high and O2 requirement had gone up. --vitamin C, zinc.  --Atrovent inhaler, PRN albuterol inhaler --PRN Mucinex for cough  # Epigastric pain # Hx of GERD # Hx of gastric ulcer --Started night of 1/6.  Was not helped with Carafate and GI cocktail.  Pt takes Bentyl and PPI at home.  No signs of GI bleed and Hgb stable.  May have an  exacerbation of her gastric ulcer. PLAN: --Start IV protonix 40 mg BID --continue home Bentyl  Hyperlipidemia -zocor  Hypotension Hx of ypertension --BP remained low despite multiple boluses --Hold home HCTZ and Lasix --Hold home benzo --Resume home opioids with hold parameter for BP --Levophed titratable ordered to keep systolic >229'N.  Cirrhosis of liver [facility]):  Mental status normal -Continue lactulose  Thrombocytopenia ([place]):  Platelet 80.  No bleeding.  Most likely due to liver cirrhosis  Iron deficiency anemia Continue iron supplement  Chronic diastolic CHF  --NYHA class 2 ([place]): 2D echo on 07/16/2018 showed EF>55.  No leg edema.  No pulmonary edema chest x-ray PA CHF is compensated. -Hold Lasix due to softer blood pressure  Bipolar I disorder, most recent episode depressed (HCC) Anxiety --Continue home bupropion, sertraline, lamictal --Hold home Klonopin 2/2 low BP  COPD (chronic obstructive pulmonary disease) ([place]): -On bronchodilators  Diabetes mellitus without complication ([place]):  Last A1c 5.6 on 11/20/2018, well controled. Patient is taking Humalog, Victoza, Lantus at home -continue Lantus 15 unit daily -SSI  Chronic back pain on chronic opioids --resume home Norco with BP hold parameter --continue home gabapentin and Lyrica   DVT prophylaxis: Lovenox SQ Code Status: Full code  Family Communication: not today Disposition Plan: to [facility]   Subjective and Interval History:  Pt continued to complain of epigastric pain and diffuse abdominal pain today, not eating well.  Overall feeling worse.  O2 requirement remained high with sats in mid 80's to low 90's.   PRIOR TO TRANSFER, PATIENT WITH INCREASED WOB AND SOB SEVERE WORSENING HYPOXIA, PATIENT WAS INTUBATED AND SEDATED, CVL PLACED  TAMMY THE SISTER WAS NOTIFIED   PATIENT IS CRITICALLY ILL  Objective: Vitals:   06/14/19 0800 06/14/19 0830 06/14/19 0900 06/14/19 0930  BP: (!) 111/53  (!) 107/48 (!) 104/53 (!) 108/59  Pulse: 64 64 61 70  Resp: (!) 29 (!) 30 (!) 25 (!) 22  Temp:    (!) 97 F (36.1 C)  TempSrc:    Axillary  SpO2: (!) 81% (!) 86% (!) 86% (!) 85%  Weight:      Height:        Intake/Output Summary (Last 24 hours) at 06/14/2019 1242 Last data filed at 06/13/2019 2000 Gross per 24 hour  Intake 157.16 ml  Output 200 ml  Net -42.84 ml   Filed Weights   06/11/19 1218 06/11/19 2143 06/12/19 1800  Weight: 101.2 kg 101.6 kg 100 kg     Data Reviewed: I have personally reviewed following labs and imaging studies  CBC: Recent Labs  Lab 06/11/19 1236 06/12/19 0130 06/13/19 0235 06/14/19 0506  WBC 2.1* 2.3* 2.2* 4.2  NEUTROABS 1.5* 1.8 1.7 3.5  HGB 10.8* 10.7* 11.0* 10.7*  HCT 31.9* 32.5* 33.4* 33.3*  MCV 90.4 91.0 92.0 92.8  PLT 80* 79* 101* 235*   Basic Metabolic Panel: Recent Labs  Lab 06/11/19 1236 06/12/19 0130 06/13/19 0235 06/14/19 0506  NA 132* 135 139 141  K 3.7 3.5 3.8 3.8  CL 97* 100 104 106  CO2 25 27 27 27   GLUCOSE 162* 149* 182* 166*  BUN 16 13 20  29*  CREATININE 1.08* 0.84 0.80 0.95  CALCIUM 8.1* 8.0* 8.2* 8.2*  MG  --  2.1 2.2 2.5*   GFR: Estimated Creatinine Clearance: 63.9 mL/min (by C-G formula based on SCr of 0.95 mg/dL). Liver Function Tests: Recent Labs  Lab 06/11/19 1236 06/12/19 0130 06/13/19 0235 06/14/19 0506  AST 43* 41 42* 37  ALT 16 16 15 12   ALKPHOS 126 121 118 111  BILITOT 0.8 0.9 1.1 1.0  PROT 6.2* 6.2* 6.1* 6.1*  ALBUMIN 3.2* 3.0* 2.9* 2.8*   No results for input(s): LIPASE, AMYLASE in the last 168 hours. Recent Labs  Lab 06/11/19 1930  AMMONIA 13   Coagulation Profile: No results for input(s): INR, PROTIME in the last 168 hours. Cardiac Enzymes: No results for input(s): CKTOTAL, CKMB, CKMBINDEX, TROPONINI in the last 168 hours. BNP (last 3 results) No results for input(s): PROBNP in the last 8760 hours. HbA1C: No results for input(s): HGBA1C in the last 72 hours. CBG: Recent Labs    Lab 06/13/19 1955 06/13/19 2346 06/14/19 0335 06/14/19 0930 06/14/19 1239  GLUCAP 177* 176* 167* 156* 162*   Lipid Profile: No results for input(s): CHOL, HDL, LDLCALC, TRIG, CHOLHDL, LDLDIRECT in the last 72 hours. Thyroid Function Tests: No results for input(s): TSH, T4TOTAL, FREET4, T3FREE, THYROIDAB in the last 72 hours. Anemia Panel: Recent Labs    06/13/19 0235 06/14/19 0506  FERRITIN 689* 621*   Sepsis Labs: Recent Labs  Lab 06/11/19 1236 06/12/19 0030  PROCALCITON <0.10  --   LATICACIDVEN 1.4 1.1    Recent Results (from the past 240 hour(s))  Culture, blood (Routine X 2) w Reflex to ID Panel     Status: None (Preliminary result)   Collection Time: 06/11/19  7:00 PM   Specimen: BLOOD  Result Value Ref Range Status   Specimen Description BLOOD BLOOD LEFT ARM  Final   Special Requests   Final    BOTTLES DRAWN AEROBIC AND ANAEROBIC Blood Culture results may not be  optimal due to an excessive volume of blood received in culture bottles   Culture   Final    NO GROWTH 3 DAYS Performed at [facility], [address] [place], [place] [address]    Report Status PENDING  Incomplete  Culture, blood (Routine X 2) w Reflex to ID Panel     Status: None (Preliminary result)   Collection Time: 06/11/19  7:30 PM   Specimen: BLOOD  Result Value Ref Range Status   Specimen Description BLOOD LEFT ANTECUBITAL  Final   Special Requests   Final    BOTTLES DRAWN AEROBIC AND ANAEROBIC Blood Culture results may not be optimal due to an excessive volume of blood received in culture bottles   Culture   Final    NO GROWTH 3 DAYS Performed at [facility], [address] [place], [place] [address]    Report Status PENDING  Incomplete  MRSA PCR Screening     Status: None   Collection Time: 06/12/19  6:05 PM   Specimen: Nasopharyngeal  Result Value Ref Range Status   MRSA by PCR NEGATIVE NEGATIVE Final    Comment:        The GeneXpert MRSA Assay  (FDA approved for NASAL specimens only), is one component of a comprehensive MRSA colonization surveillance program. It is not intended to diagnose MRSA infection nor to guide or monitor treatment for MRSA infections. Performed at [facility], [address], [place],  [address]       Radiology Studies: No results found.   Scheduled Meds: . buPROPion  100 mg Oral QPC breakfast  . Chlorhexidine Gluconate Cloth  6 each Topical Daily  . dextromethorphan-guaiFENesin  1 tablet Oral BID  . diclofenac sodium  4 g Topical TID  . dicyclomine  10 mg Oral TID  . donepezil  10 mg Oral QHS  . enoxaparin (LOVENOX) injection  40 mg Subcutaneous Q24H  . ferrous sulfate  325 mg Oral BID  . gabapentin  300 mg Oral 2 times per day  . gabapentin  600 mg Oral Daily  . hydrocortisone  1 application Topical Daily  . insulin aspart  0-9 Units Subcutaneous Q4H  . insulin glargine  15 Units Subcutaneous QPM  . ipratropium  2 puff Inhalation Q4H  . lactulose  10 g Oral Daily  . lamoTRIgine  150 mg Oral BID  . lidocaine  1 patch Transdermal Q24H  . methylPREDNISolone (SOLU-MEDROL) injection  40 mg Intravenous Q8H  . nystatin   Topical QID  . pantoprazole (PROTONIX) IV  40 mg Intravenous Q12H  . pregabalin  100 mg Oral QHS  . pregabalin  75 mg Oral 2 times per day  . rOPINIRole  3 mg Oral Daily  . sertraline  25 mg Oral Daily  . simvastatin  10 mg Oral QHS   Continuous Infusions: . norepinephrine (LEVOPHED) Adult infusion    . remdesivir 100 mg in NS 100 mL 100 mg (06/14/19 0957)     LOS: 3 days    Total time spend on discharging this patient, including the last patient exam, discussing the hospital stay, instructions for ongoing care as it relates to all pertinent caregivers, as well as preparing the medical discharge records, prescriptions, and/or referrals as applicable, is 40 minutes.   Enzo Bi, MD [facility] If 7PM-7AM, please contact  night-coverage 06/14/2019, 12:42 PM

## 2019-06-14 NOTE — Progress Notes (Signed)
[facility] called this RN to inform that this patient will be transferred to [facility]. Patient room number is 153 and phone number is [phone]. [facility] stated that they would call back when the ambulance is en route. Patient is currently stable on the ventilator with fentanyl and levophed drips. Vital signs are within normal limits. Will continue to monitor.  Cameron Ali, RN

## 2019-06-15 ENCOUNTER — Inpatient Hospital Stay (HOSPITAL_COMMUNITY)
Admission: AD | Admit: 2019-06-15 | Discharge: 2019-07-08 | DRG: 870 | Disposition: E | Payer: Medicare Other | Source: Other Acute Inpatient Hospital | Attending: Pulmonary Disease | Admitting: Pulmonary Disease

## 2019-06-15 ENCOUNTER — Inpatient Hospital Stay (HOSPITAL_COMMUNITY): Payer: Medicare Other

## 2019-06-15 DIAGNOSIS — Z515 Encounter for palliative care: Secondary | ICD-10-CM | POA: Diagnosis not present

## 2019-06-15 DIAGNOSIS — R6521 Severe sepsis with septic shock: Secondary | ICD-10-CM | POA: Diagnosis present

## 2019-06-15 DIAGNOSIS — Z87891 Personal history of nicotine dependence: Secondary | ICD-10-CM

## 2019-06-15 DIAGNOSIS — Z66 Do not resuscitate: Secondary | ICD-10-CM | POA: Diagnosis not present

## 2019-06-15 DIAGNOSIS — T380X5A Adverse effect of glucocorticoids and synthetic analogues, initial encounter: Secondary | ICD-10-CM | POA: Diagnosis present

## 2019-06-15 DIAGNOSIS — U071 COVID-19: Secondary | ICD-10-CM | POA: Diagnosis present

## 2019-06-15 DIAGNOSIS — J1282 Pneumonia due to coronavirus disease 2019: Secondary | ICD-10-CM | POA: Diagnosis present

## 2019-06-15 DIAGNOSIS — F3130 Bipolar disorder, current episode depressed, mild or moderate severity, unspecified: Secondary | ICD-10-CM | POA: Diagnosis present

## 2019-06-15 DIAGNOSIS — G4700 Insomnia, unspecified: Secondary | ICD-10-CM | POA: Diagnosis present

## 2019-06-15 DIAGNOSIS — B1910 Unspecified viral hepatitis B without hepatic coma: Secondary | ICD-10-CM | POA: Diagnosis present

## 2019-06-15 DIAGNOSIS — I110 Hypertensive heart disease with heart failure: Secondary | ICD-10-CM | POA: Diagnosis present

## 2019-06-15 DIAGNOSIS — J069 Acute upper respiratory infection, unspecified: Secondary | ICD-10-CM | POA: Diagnosis not present

## 2019-06-15 DIAGNOSIS — K760 Fatty (change of) liver, not elsewhere classified: Secondary | ICD-10-CM | POA: Diagnosis present

## 2019-06-15 DIAGNOSIS — E870 Hyperosmolality and hypernatremia: Secondary | ICD-10-CM | POA: Diagnosis not present

## 2019-06-15 DIAGNOSIS — N179 Acute kidney failure, unspecified: Secondary | ICD-10-CM | POA: Diagnosis present

## 2019-06-15 DIAGNOSIS — M1990 Unspecified osteoarthritis, unspecified site: Secondary | ICD-10-CM | POA: Diagnosis present

## 2019-06-15 DIAGNOSIS — K589 Irritable bowel syndrome without diarrhea: Secondary | ICD-10-CM | POA: Diagnosis present

## 2019-06-15 DIAGNOSIS — G2581 Restless legs syndrome: Secondary | ICD-10-CM | POA: Diagnosis present

## 2019-06-15 DIAGNOSIS — Z79899 Other long term (current) drug therapy: Secondary | ICD-10-CM

## 2019-06-15 DIAGNOSIS — K7460 Unspecified cirrhosis of liver: Secondary | ICD-10-CM | POA: Diagnosis present

## 2019-06-15 DIAGNOSIS — Z833 Family history of diabetes mellitus: Secondary | ICD-10-CM | POA: Diagnosis not present

## 2019-06-15 DIAGNOSIS — Z981 Arthrodesis status: Secondary | ICD-10-CM

## 2019-06-15 DIAGNOSIS — E785 Hyperlipidemia, unspecified: Secondary | ICD-10-CM | POA: Diagnosis present

## 2019-06-15 DIAGNOSIS — Z8673 Personal history of transient ischemic attack (TIA), and cerebral infarction without residual deficits: Secondary | ICD-10-CM | POA: Diagnosis not present

## 2019-06-15 DIAGNOSIS — Z888 Allergy status to other drugs, medicaments and biological substances status: Secondary | ICD-10-CM

## 2019-06-15 DIAGNOSIS — J9601 Acute respiratory failure with hypoxia: Secondary | ICD-10-CM

## 2019-06-15 DIAGNOSIS — A4189 Other specified sepsis: Principal | ICD-10-CM | POA: Diagnosis present

## 2019-06-15 DIAGNOSIS — K219 Gastro-esophageal reflux disease without esophagitis: Secondary | ICD-10-CM | POA: Diagnosis present

## 2019-06-15 DIAGNOSIS — Z881 Allergy status to other antibiotic agents status: Secondary | ICD-10-CM

## 2019-06-15 DIAGNOSIS — Z885 Allergy status to narcotic agent status: Secondary | ICD-10-CM

## 2019-06-15 DIAGNOSIS — J15211 Pneumonia due to Methicillin susceptible Staphylococcus aureus: Secondary | ICD-10-CM | POA: Diagnosis present

## 2019-06-15 DIAGNOSIS — I509 Heart failure, unspecified: Secondary | ICD-10-CM | POA: Diagnosis present

## 2019-06-15 DIAGNOSIS — D696 Thrombocytopenia, unspecified: Secondary | ICD-10-CM | POA: Diagnosis not present

## 2019-06-15 DIAGNOSIS — E1165 Type 2 diabetes mellitus with hyperglycemia: Secondary | ICD-10-CM | POA: Diagnosis present

## 2019-06-15 DIAGNOSIS — J440 Chronic obstructive pulmonary disease with acute lower respiratory infection: Secondary | ICD-10-CM | POA: Diagnosis present

## 2019-06-15 DIAGNOSIS — J80 Acute respiratory distress syndrome: Secondary | ICD-10-CM | POA: Diagnosis present

## 2019-06-15 DIAGNOSIS — J9600 Acute respiratory failure, unspecified whether with hypoxia or hypercapnia: Secondary | ICD-10-CM

## 2019-06-15 DIAGNOSIS — G4733 Obstructive sleep apnea (adult) (pediatric): Secondary | ICD-10-CM | POA: Diagnosis present

## 2019-06-15 DIAGNOSIS — J9602 Acute respiratory failure with hypercapnia: Secondary | ICD-10-CM | POA: Diagnosis present

## 2019-06-15 DIAGNOSIS — R739 Hyperglycemia, unspecified: Secondary | ICD-10-CM | POA: Diagnosis not present

## 2019-06-15 DIAGNOSIS — Z01818 Encounter for other preprocedural examination: Secondary | ICD-10-CM

## 2019-06-15 DIAGNOSIS — Z794 Long term (current) use of insulin: Secondary | ICD-10-CM

## 2019-06-15 LAB — POCT I-STAT 7, (LYTES, BLD GAS, ICA,H+H)
Acid-Base Excess: 1 mmol/L (ref 0.0–2.0)
Bicarbonate: 28.1 mmol/L — ABNORMAL HIGH (ref 20.0–28.0)
Bicarbonate: 27.4 mmol/L (ref 20.0–28.0)
Calcium, Ion: 1.25 mmol/L (ref 1.15–1.40)
Calcium, Ion: 1.19 mmol/L (ref 1.15–1.40)
HCT: 36 % (ref 36.0–46.0)
HCT: 34 % — ABNORMAL LOW (ref 36.0–46.0)
Hemoglobin: 12.2 g/dL (ref 12.0–15.0)
Hemoglobin: 11.6 g/dL — ABNORMAL LOW (ref 12.0–15.0)
O2 Saturation: 90 %
O2 Saturation: 99 %
Potassium: 4.1 mmol/L (ref 3.5–5.1)
Potassium: 4 mmol/L (ref 3.5–5.1)
Sodium: 143 mmol/L (ref 135–145)
Sodium: 144 mmol/L (ref 135–145)
TCO2: 30 mmol/L (ref 22–32)
TCO2: 29 mmol/L (ref 22–32)
pCO2 arterial: 59.3 mmHg — ABNORMAL HIGH (ref 32.0–48.0)
pCO2 arterial: 48.8 mmHg — ABNORMAL HIGH (ref 32.0–48.0)
pH, Arterial: 7.284 — ABNORMAL LOW (ref 7.350–7.450)
pH, Arterial: 7.357 (ref 7.350–7.450)
pO2, Arterial: 66 mmHg — ABNORMAL LOW (ref 83.0–108.0)
pO2, Arterial: 146 mmHg — ABNORMAL HIGH (ref 83.0–108.0)

## 2019-06-15 LAB — COMPREHENSIVE METABOLIC PANEL
ALT: 15 U/L (ref 0–44)
AST: 35 U/L (ref 15–41)
Albumin: 3.2 g/dL — ABNORMAL LOW (ref 3.5–5.0)
Alkaline Phosphatase: 130 U/L — ABNORMAL HIGH (ref 38–126)
Anion gap: 11 (ref 5–15)
BUN: 38 mg/dL — ABNORMAL HIGH (ref 8–23)
CO2: 26 mmol/L (ref 22–32)
Calcium: 8.4 mg/dL — ABNORMAL LOW (ref 8.9–10.3)
Chloride: 106 mmol/L (ref 98–111)
Creatinine, Ser: 1.23 mg/dL — ABNORMAL HIGH (ref 0.44–1.00)
GFR calc Af Amer: 52 mL/min — ABNORMAL LOW (ref >60)
GFR calc non Af Amer: 45 mL/min — ABNORMAL LOW (ref >60)
Glucose, Bld: 283 mg/dL — ABNORMAL HIGH (ref 70–99)
Potassium: 4.1 mmol/L (ref 3.5–5.1)
Sodium: 143 mmol/L (ref 135–145)
Total Bilirubin: 1.3 mg/dL — ABNORMAL HIGH (ref 0.3–1.2)
Total Protein: 6.9 g/dL (ref 6.5–8.1)

## 2019-06-15 LAB — GLUCOSE, CAPILLARY
Glucose-Capillary: 243 mg/dL — ABNORMAL HIGH (ref 70–99)
Glucose-Capillary: 281 mg/dL — ABNORMAL HIGH (ref 70–99)
Glucose-Capillary: 224 mg/dL — ABNORMAL HIGH (ref 70–99)
Glucose-Capillary: 355 mg/dL — ABNORMAL HIGH (ref 70–99)
Glucose-Capillary: 378 mg/dL — ABNORMAL HIGH (ref 70–99)
Glucose-Capillary: 354 mg/dL — ABNORMAL HIGH (ref 70–99)

## 2019-06-15 LAB — CBC WITH DIFFERENTIAL/PLATELET
Abs Immature Granulocytes: 0.17 10*3/uL — ABNORMAL HIGH (ref 0.00–0.07)
Basophils Absolute: 0 10*3/uL (ref 0.0–0.1)
Basophils Relative: 0 %
Eosinophils Absolute: 0.1 10*3/uL (ref 0.0–0.5)
Eosinophils Relative: 1 %
HCT: 37.6 % (ref 36.0–46.0)
Hemoglobin: 12 g/dL (ref 12.0–15.0)
Immature Granulocytes: 1 %
Lymphocytes Relative: 2 %
Lymphs Abs: 0.3 10*3/uL — ABNORMAL LOW (ref 0.7–4.0)
MCH: 30.2 pg (ref 26.0–34.0)
MCHC: 31.9 g/dL (ref 30.0–36.0)
MCV: 94.7 fL (ref 80.0–100.0)
Monocytes Absolute: 2 10*3/uL — ABNORMAL HIGH (ref 0.1–1.0)
Monocytes Relative: 15 %
Neutro Abs: 11.1 10*3/uL — ABNORMAL HIGH (ref 1.7–7.7)
Neutrophils Relative %: 81 %
Platelets: 219 10*3/uL (ref 150–400)
RBC: 3.97 MIL/uL (ref 3.87–5.11)
RDW: 15.6 % — ABNORMAL HIGH (ref 11.5–15.5)
WBC: 13.7 10*3/uL — ABNORMAL HIGH (ref 4.0–10.5)
nRBC: 0.2 % (ref 0.0–0.2)

## 2019-06-15 LAB — MAGNESIUM
Magnesium: 2.5 mg/dL — ABNORMAL HIGH (ref 1.7–2.4)
Magnesium: 2.5 mg/dL — ABNORMAL HIGH (ref 1.7–2.4)

## 2019-06-15 LAB — PHOSPHORUS: Phosphorus: 3.2 mg/dL (ref 2.5–4.6)

## 2019-06-15 LAB — C-REACTIVE PROTEIN: CRP: 7.5 mg/dL — ABNORMAL HIGH (ref <1.0)

## 2019-06-15 LAB — FERRITIN: Ferritin: 514 ng/mL — ABNORMAL HIGH (ref 11–307)

## 2019-06-15 LAB — FIBRIN DERIVATIVES D-DIMER (ARMC ONLY): Fibrin derivatives D-dimer (ARMC): 1088.22 ng/mL (FEU) — ABNORMAL HIGH (ref 0.00–499.00)

## 2019-06-15 MED ORDER — DEXAMETHASONE SODIUM PHOSPHATE 10 MG/ML IJ SOLN
6.0000 mg | INTRAMUSCULAR | Status: AC
  Administered 2019-06-15 – 2019-06-24 (×10): 6 mg via INTRAVENOUS
  Filled 2019-06-15 (×10): qty 1

## 2019-06-15 MED ORDER — CHLORHEXIDINE GLUCONATE 0.12% ORAL RINSE (MEDLINE KIT): 15.0000 mL | Freq: Two times a day (BID) | OROMUCOSAL | 0 refills | Status: AC

## 2019-06-15 MED ORDER — MIDAZOLAM HCL 2 MG/2ML IJ SOLN
1.0000 mg | INTRAMUSCULAR | Status: AC | PRN
  Administered 2019-06-17 (×3): 1 mg via INTRAVENOUS
  Filled 2019-06-15 (×3): qty 2

## 2019-06-15 MED ORDER — SODIUM CHLORIDE 0.9 % IV SOLN
100.0000 mg | Freq: Once | INTRAVENOUS | Status: AC
  Administered 2019-06-15: 12:00:00 100 mg via INTRAVENOUS
  Filled 2019-06-15: qty 20

## 2019-06-15 MED ORDER — ORAL CARE MOUTH RINSE
15.0000 mL | OROMUCOSAL | Status: DC
  Administered 2019-06-15 – 2019-06-24 (×92): 15 mL via OROMUCOSAL

## 2019-06-15 MED ORDER — FENTANYL 2500MCG IN NS 250ML (10MCG/ML) PREMIX INFUSION
0.0000 ug/h | INTRAVENOUS | Status: DC
  Administered 2019-06-15: 17:00:00 250 ug/h via INTRAVENOUS
  Administered 2019-06-16: 17:00:00 350 ug/h via INTRAVENOUS
  Administered 2019-06-16: 400 ug/h via INTRAVENOUS
  Administered 2019-06-16 (×2): 350 ug/h via INTRAVENOUS
  Administered 2019-06-17 – 2019-06-18 (×5): 400 ug/h via INTRAVENOUS
  Administered 2019-06-18 (×2): 350 ug/h via INTRAVENOUS
  Administered 2019-06-18: 400 ug/h via INTRAVENOUS
  Administered 2019-06-19 – 2019-06-21 (×8): 350 ug/h via INTRAVENOUS
  Filled 2019-06-15 (×21): qty 250

## 2019-06-15 MED ORDER — VITAL HIGH PROTEIN PO LIQD
1000.0000 mL | ORAL | Status: DC
  Administered 2019-06-15: 15:00:00 1000 mL

## 2019-06-15 MED ORDER — ZINC SULFATE 220 (50 ZN) MG PO CAPS
220.0000 mg | ORAL_CAPSULE | Freq: Every day | ORAL | Status: DC
  Administered 2019-06-15 – 2019-06-24 (×10): 220 mg
  Filled 2019-06-15 (×10): qty 1

## 2019-06-15 MED ORDER — ENOXAPARIN SODIUM 60 MG/0.6ML ~~LOC~~ SOLN
0.5000 mg/kg | Freq: Two times a day (BID) | SUBCUTANEOUS | Status: DC
  Administered 2019-06-15 – 2019-06-24 (×19): 50 mg via SUBCUTANEOUS
  Filled 2019-06-15 (×19): qty 0.6

## 2019-06-15 MED ORDER — FENTANYL CITRATE (PF) 100 MCG/2ML IJ SOLN: 25.0000 ug | INTRAMUSCULAR | Status: DC | PRN

## 2019-06-15 MED ORDER — CHLORHEXIDINE GLUCONATE 0.12% ORAL RINSE (MEDLINE KIT)
15.0000 mL | Freq: Two times a day (BID) | OROMUCOSAL | Status: DC
  Administered 2019-06-15 (×2): 15 mL via OROMUCOSAL

## 2019-06-15 MED ORDER — SODIUM CHLORIDE 0.9% FLUSH
10.0000 mL | Freq: Two times a day (BID) | INTRAVENOUS | Status: DC
  Administered 2019-06-15 – 2019-06-24 (×15): 10 mL

## 2019-06-15 MED ORDER — MIDAZOLAM HCL 2 MG/2ML IJ SOLN
1.0000 mg | INTRAMUSCULAR | Status: DC | PRN
  Administered 2019-06-17 – 2019-06-21 (×8): 1 mg via INTRAVENOUS
  Filled 2019-06-15 (×3): qty 2

## 2019-06-15 MED ORDER — FENTANYL CITRATE (PF) 100 MCG/2ML IJ SOLN
25.0000 ug | INTRAMUSCULAR | Status: DC | PRN
  Administered 2019-06-17 – 2019-06-18 (×13): 100 ug via INTRAVENOUS

## 2019-06-15 MED ORDER — INSULIN DETEMIR 100 UNIT/ML ~~LOC~~ SOLN
0.0750 [IU]/kg | Freq: Two times a day (BID) | SUBCUTANEOUS | Status: DC
  Administered 2019-06-15 – 2019-06-16 (×3): 8 [IU] via SUBCUTANEOUS
  Filled 2019-06-15 (×4): qty 0.08

## 2019-06-15 MED ORDER — ASCORBIC ACID 500 MG PO TABS
500.0000 mg | ORAL_TABLET | Freq: Two times a day (BID) | ORAL | Status: AC
  Administered 2019-06-15 – 2019-06-19 (×10): 500 mg
  Filled 2019-06-15 (×12): qty 1

## 2019-06-15 MED ORDER — INSULIN ASPART 100 UNIT/ML ~~LOC~~ SOLN
0.0000 [IU] | SUBCUTANEOUS | Status: DC
  Administered 2019-06-15: 17:00:00 15 [IU] via SUBCUTANEOUS
  Administered 2019-06-15: 14 [IU] via SUBCUTANEOUS
  Administered 2019-06-15: 21:00:00 15 [IU] via SUBCUTANEOUS
  Administered 2019-06-16 (×2): 11 [IU] via SUBCUTANEOUS
  Administered 2019-06-16: 8 [IU] via SUBCUTANEOUS
  Administered 2019-06-16: 10:00:00 5 [IU] via SUBCUTANEOUS
  Administered 2019-06-16: 3 [IU] via SUBCUTANEOUS
  Administered 2019-06-16 – 2019-06-17 (×3): 11 [IU] via SUBCUTANEOUS
  Administered 2019-06-17 (×3): 8 [IU] via SUBCUTANEOUS
  Administered 2019-06-18: 15 [IU] via SUBCUTANEOUS
  Administered 2019-06-18 (×2): 11 [IU] via SUBCUTANEOUS

## 2019-06-15 MED ORDER — CHLORHEXIDINE GLUCONATE CLOTH 2 % EX PADS
6.0000 | MEDICATED_PAD | Freq: Every day | CUTANEOUS | Status: DC
  Administered 2019-06-15 – 2019-06-24 (×10): 6 via TOPICAL

## 2019-06-15 MED ORDER — SODIUM CHLORIDE 0.9% FLUSH: 10.0000 mL | INTRAVENOUS | Status: DC | PRN

## 2019-06-15 MED ORDER — PRO-STAT SUGAR FREE PO LIQD
30.0000 mL | Freq: Two times a day (BID) | ORAL | Status: DC
  Administered 2019-06-15 – 2019-06-17 (×5): 30 mL
  Filled 2019-06-15 (×4): qty 30

## 2019-06-15 MED ORDER — NOREPINEPHRINE 4 MG/250ML-% IV SOLN
0.0000 ug/min | INTRAVENOUS | Status: DC
  Administered 2019-06-15: 16:00:00 5 ug/min via INTRAVENOUS
  Administered 2019-06-16: 6 ug/min via INTRAVENOUS
  Administered 2019-06-16: 8 ug/min via INTRAVENOUS
  Filled 2019-06-15 (×3): qty 250

## 2019-06-15 NOTE — Progress Notes (Signed)
Report given to [facility] personnel en route to pick up the patient. Report also given to Rennie Natter, RN, at the [facility], who will assume care of the patient. The patient is being transferred to the ICU, 208, bed 4.  Cameron Ali, RN

## 2019-06-15 NOTE — H&P (Signed)
 NAME:  Stephanie [name], MRN:  [id number], DOB:  03/04/1951, LOS: 0 ADMISSION DATE:  06/19/2019, CONSULTATION DATE:  07/04/2019 REFERRING MD:  Kasa, CHIEF COMPLAINT:  ARDS/resp failure   Brief History   69 y.o. Caucasianfemalewith medical history significant ofhypertension, hyperlipidemia, diabetes mellitus, COPD, asthma, stroke, GERD, depression with anxiety, thrombocytopenia, liver cirrhosis, hepatitis B 20 years ago, bipolar disorder, iron deficiency anemia, CHF, transferred from [place] this a.m. with respiratory failure 2/2 COVID  History of present illness   69 y.o. Caucasianfemalewith medical history significant ofhypertension, hyperlipidemia, diabetes mellitus, COPD, asthma, stroke, GERD, depression with anxiety, thrombocytopenia, liver cirrhosis, hepatitis B 20 years ago, bipolar disorder, iron deficiency anemia, CHF, admitted to [place] on 06/11/2019 from her skilled nursing facility having tested positive for Covid 3 days prior, but having worsening shortness of breath and oxygen saturations in the 70s. Treatment with remdesivir and steroids was begun, but the patient became more hypoxic on 06/14/2019, ultimately requiring intubation, placement of a central line, and transferred 1/9 to [facility] for escalated level of care. Past Medical History  -- Adenomatous colon polyp (08/27/2014),  --Anemia -- Anxiety,  --Aortic dissection (HCC), Arthritis, Asthma, Bipolar affective disorder (HCC), Blood transfusion (2000), Blood transfusion without reported diagnosis, Cancer (HCC), Chronic abdominal pain, Cirrhosis (HCC), Colon polyp, COPD (chronic obstructive pulmonary disease) (HCC), Depression, Diabetes mellitus without complication (HCC), Fatty liver (04/09/08), GERD (gastroesophageal reflux disease), H/O: CVA (cardiovascular accident), H/O: rheumatic fever, Hepatitis, Hyperlipidemia, Hypertension, IBS (irritable bowel syndrome), Incontinence, Insomnia, Lower extremity edema, Neuropathy, Obstructive  sleep apnea, Platelets decreased (HCC), RLS (restless legs syndrome), Shortness of breath, Sleep apnea, Stroke (HCC), Ulcer, Unspecified disorders of nervous system, and Wears dentures. PSH:  Cholecystectomy;  Rotator cuff repair;  Abdominal hysterectomy;  Lumbar fusion (10/09);  Hemorroidectomy;  Abdominal exploration surgery;  Posterior cervical fusion/foraminotomy;  Lumbar wound debridement (05/09/2011);  Axillary artery cannulation via 8-mm Hemashield graft, median sternotomy, extracorporeal circulation with deep hypothermic circulatory arrest, repair of  aortic dessection (01/23/2009);  cataract;  Colonoscopy;  Liver biopsy (04/10/2012); Back surgery; Eye surgery (Bilateral); Knee arthroscopy with lateral release (Right, 11/15/2016); Facial laceration repair (Left, 04/11/2018); Colonoscopy with propofol (N/A, 02/26/2019); and Esophagogastroduodenoscopy (egd) with propofol (02/26/2019).  Significant Hospital Events   1/9 intubation  Consults:  PCCM  Procedures:  1/9 Intubation 1/9 L IJ TLC  Significant Diagnostic Tests:    Micro Data:  Blood cx neg x4 days   Antimicrobials:  n/a   Interim history/subjective:  Pt transferred, on ventilator, resting. Able to turn head to name. Does not appear uncomfortable  Objective   Blood pressure (!) 104/40, pulse 77, resp. rate (!) 30, SpO2 97 %.    Vent Mode: PRVC FiO2 (%):  [100 %] 100 % Set Rate:  [15 bmp-30 bmp] 30 bmp Vt Set:  [400 mL-450 mL] 400 mL PEEP:  [5 cmH20-15 cmH20] 14 cmH20 Plateau Pressure:  [15 cmH20-33 cmH20] 33 cmH20  No intake or output data in the 24 hours ending 07/01/2019 1118 There were no vitals filed for this visit.  Examination: General: NAD HENT: AT/[place]; ETT in place Lungs: crackles diffusely throughout Cardiovascular: RRR, no r/m/g Abdomen: SND Extremities: no c/c/e Neuro: PERRLA, EOMI, w/d to nox stim all 4 extrem GU: foley  Resolved Hospital Problem list   none  Assessment & Plan:  Severe  ACUTE Hypoxic and Hypercapnic Respiratory Failure from COVID 19 pneumonia with ARSA -continue Full MV support -continue Bronchodilator Therapy -ARDS protocol, wean FiO2 and other parameters as able   SHOCK-SEPSIS/HYPOVOLUMIC -use vasopressors to keep   MAP>65 -follow ABG and LA -follow up cultures -emperic ABX  Severe COVID-19 infection, ARDS and pneumonia/pneumonitis Continue IV steroids  IV remdisivir Continue proning as tolerated due to severe hypoxia   Maintain airborne and contact precautions  As needed bronchodilators (MDI) Vitamin C and zinc Antitussives High risk for death   ID -continue IV abx as prescibed -follow up cultures  F/E/N: DIET-->TF's as tolerated Constipation protocol as indicated   ENDO - will use ICU hypoglycemic\Hyperglycemia protocol if needed    ELECTROLYTES -follow labs as needed -replace as needed -pharmacy consultation and following   Best practice:  Diet: NPO, but will consult nutrition for TF Pain/Anxiety/Delirium protocol (if indicated): in place VAP protocol (if indicated): yes DVT prophylaxis: lovenox, GI prophylaxis: PPI, HOB elevated Glucose control: SSI Mobility: bed Code Status: full Family Communication: will update family  Disposition: ICU  Labs   CBC: Recent Labs  Lab 06/11/19 1236 06/12/19 0130 06/13/19 0235 06/14/19 0506 06/14/2019 0346 06/16/2019 1026  WBC 2.1* 2.3* 2.2* 4.2 13.7*  --   NEUTROABS 1.5* 1.8 1.7 3.5 11.1*  --   HGB 10.8* 10.7* 11.0* 10.7* 12.0 12.2  HCT 31.9* 32.5* 33.4* 33.3* 37.6 36.0  MCV 90.4 91.0 92.0 92.8 94.7  --   PLT 80* 79* 101* 126* 219  --     Basic Metabolic Panel: Recent Labs  Lab 06/11/19 1236 06/12/19 0130 06/13/19 0235 06/14/19 0506  0346 07/07/2019 1026  NA 132* 135 139 141 143 143  K 3.7 3.5 3.8 3.8 4.1 4.1  CL 97* 100 104 106 106  --   CO2 25 27 27 27 26  --   GLUCOSE 162* 149* 182* 166* 283*  --   BUN 16 13 20 29* 38*  --   CREATININE 1.08*  0.84 0.80 0.95 1.23*  --   CALCIUM 8.1* 8.0* 8.2* 8.2* 8.4*  --   MG  --  2.1 2.2 2.5* 2.5*  --    GFR: Estimated Creatinine Clearance: 49.4 mL/min (A) (by C-G formula based on SCr of 1.23 mg/dL (H)). Recent Labs  Lab 06/11/19 1236 06/12/19 0030 06/12/19 0130 06/13/19 0235 06/14/19 0506 06/26/2019 0346  PROCALCITON <0.10  --   --   --   --   --   WBC 2.1*  --  2.3* 2.2* 4.2 13.7*  LATICACIDVEN 1.4 1.1  --   --   --   --     Liver Function Tests: Recent Labs  Lab 06/11/19 1236 06/12/19 0130 06/13/19 0235 06/14/19 0506 06/28/2019 0346  AST 43* 41 42* 37 35  ALT 16 16 15 12 15  ALKPHOS 126 121 118 111 130*  BILITOT 0.8 0.9 1.1 1.0 1.3*  PROT 6.2* 6.2* 6.1* 6.1* 6.9  ALBUMIN 3.2* 3.0* 2.9* 2.8* 3.2*   No results for input(s): LIPASE, AMYLASE in the last 168 hours. Recent Labs  Lab 06/11/19 1930  AMMONIA 13    ABG    Component Value Date/Time   PHART 7.284 (L) 06/16/2019 1026   PCO2ART 59.3 (H) 06/14/2019 1026   PO2ART 66.0 (L) 06/14/2019 1026   HCO3 28.1 (H) 07/02/2019 1026   TCO2 30 06/29/2019 1026   O2SAT 90.0 06/09/2019 1026     Coagulation Profile: No results for input(s): INR, PROTIME in the last 168 hours.  Cardiac Enzymes: No results for input(s): CKTOTAL, CKMB, CKMBINDEX, TROPONINI in the last 168 hours.  HbA1C: Hemoglobin A1C  Date/Time Value Ref Range Status  09/15/2015 12:00 AM 7.0  Final  04/04/2013 04:26   AM 6.0 4.2 - 6.3 % Final    Comment:    The American Diabetes Association recommends that a primary goal of therapy should be <7% and that physicians should reevaluate the treatment regimen in patients with HbA1c values consistently >8%.    Hgb A1c MFr Bld  Date/Time Value Ref Range Status  11/20/2018 10:52 AM 5.6 4.8 - 5.6 % Final    Comment:    (NOTE) Pre diabetes:          5.7%-6.4% Diabetes:              >6.4% Glycemic control for   <7.0% adults with diabetes   07/20/2016 04:28 AM 5.2 4.8 - 5.6 % Final    Comment:    (NOTE)          Pre-diabetes: 5.7 - 6.4         Diabetes: >6.4         Glycemic control for adults with diabetes: <7.0     CBG: Recent Labs  Lab 06/14/19 1239 06/14/19 1904 06/14/19 2331  0336 06/21/2019 0726  GLUCAP 162* 214* 296* 243* 281*    Review of Systems:   Unable to obtain; pt intubated, sedated (mildly)  Past Medical History  She,  has a past medical history of Adenomatous colon polyp (08/27/2014), Anemia, Anxiety, Aortic dissection (HCC), Arthritis, Asthma, Bipolar affective disorder (HCC), Blood transfusion (2000), Blood transfusion without reported diagnosis, Cancer (HCC), Chronic abdominal pain, Cirrhosis (HCC), Colon polyp, COPD (chronic obstructive pulmonary disease) (HCC), Depression, Diabetes mellitus without complication (HCC), Fatty liver (04/09/08), GERD (gastroesophageal reflux disease), H/O: CVA (cardiovascular accident), H/O: rheumatic fever, Hepatitis, Hyperlipidemia, Hypertension, IBS (irritable bowel syndrome), Incontinence, Insomnia, Lower extremity edema, Neuropathy, Obstructive sleep apnea, Platelets decreased (HCC), RLS (restless legs syndrome), Shortness of breath, Sleep apnea, Stroke (HCC), Ulcer, Unspecified disorders of nervous system, and Wears dentures.   Surgical History    Past Surgical History:  Procedure Laterality Date  . ABDOMINAL EXPLORATION SURGERY    . ABDOMINAL HYSTERECTOMY     total  . Axillary artery cannulation via 8-mm Hemashield graft, median sternotomy, extracorporeal circulation with deep hypothermic circulatory arrest, repair of  aortic dessection  01/23/2009   Dr Burney  . BACK SURGERY      x 5  . cataract     both eyes  . CHOLECYSTECTOMY    . COLONOSCOPY    . COLONOSCOPY WITH PROPOFOL N/A 02/26/2019   Procedure: COLONOSCOPY WITH PROPOFOL;  Surgeon: Wohl, Darren, MD;  Location: [facility];  Service: Endoscopy;  Laterality: N/A;  . ESOPHAGOGASTRODUODENOSCOPY (EGD) WITH PROPOFOL  02/26/2019   Procedure:  ESOPHAGOGASTRODUODENOSCOPY (EGD) WITH PROPOFOL;  Surgeon: Wohl, Darren, MD;  Location: [facility];  Service: Endoscopy;;  . EYE SURGERY Bilateral    Cataract Extraction with IOL  . FACIAL LACERATION REPAIR Left 04/11/2018   Procedure: EARLOBE REPAIR;  Surgeon: Vaught, Creighton, MD;  Location: [facility];  Service: ENT;  Laterality: Left;  Diabetic - insulin and oral meds sleep apnea  . HEMORROIDECTOMY     and colon polyp removed  . KNEE ARTHROSCOPY WITH LATERAL RELEASE Right 11/15/2016   Procedure: KNEE ARTHROSCOPY WITH LATERAL RELEASE;  Surgeon: Poggi, John J, MD;  Location: [facility];  Service: Orthopedics;  Laterality: Right;  . LIVER BIOPSY  04/10/2012   Procedure: LIVER BIOPSY;  Surgeon: Robert D Kaplan, MD;  Location: [facility];  Service: Endoscopy;  Laterality: N/A;  ultrasound to mark order for abd limited/liver to be marked to be sent by [email or web address]  .   LUMBAR FUSION  10/09  . LUMBAR WOUND DEBRIDEMENT  05/09/2011   Procedure: LUMBAR WOUND DEBRIDEMENT;  Surgeon: Dahari D Brooks;  Location: [facility];  Service: Orthopedics;  Laterality: N/A;  IRRIGATION AND DEBRIDEMENT SPINAL WOUND  . POSTERIOR CERVICAL FUSION/FORAMINOTOMY    . ROTATOR CUFF REPAIR     left     Social History   reports that she quit smoking about 6 years ago. Her smoking use included cigarettes. She has a 75.00 pack-year smoking history. She has never used smokeless tobacco. She reports that she does not drink alcohol or use drugs.   Family History   Her family history includes Anxiety disorder in her brother; Breast cancer in her mother; Depression in her brother; Diabetes in an other family member; Lung cancer in her mother; Stomach cancer in her father. There is no history of Colon cancer, Esophageal cancer, or Rectal cancer.   Allergies Allergies  Allergen Reactions  . Cortisone     Increased BS  . Doxycycline Hives, Swelling, Other (See Comments) and Nausea Only    Reaction:  Facial swelling  Face  red and swollen; no difficulty breathing  . Morphine Other (See Comments)     Home Medications  Prior to Admission medications   Medication Sig Start Date End Date Taking? Authorizing Provider  albuterol (VENTOLIN HFA) 108 (90 Base) MCG/ACT inhaler Inhale 2 puffs into the lungs every 6 (six) hours as needed for wheezing or shortness of breath. 04/22/19   Sood, Vineet, MD  albuterol (VENTOLIN HFA) 108 (90 Base) MCG/ACT inhaler Inhale 2 puffs into the lungs every 6 (six) hours as needed for wheezing or shortness of breath.    [provider]  Amino Acids-Protein Hydrolys (FEEDING SUPPLEMENT, PRO-STAT SUGAR FREE 64,) LIQD Place 30 mLs into feeding tube 2 (two) times daily. 06/14/19   Kasa, Kurian, MD  ascorbic acid (VITAMIN C) 500 MG tablet Place 1 tablet (500 mg total) into feeding tube 2 (two) times daily. 06/14/19   Kasa, Kurian, MD  B Complex-C (B-COMPLEX WITH VITAMIN C) tablet Place 1 tablet into feeding tube daily. 06/14/19   Kasa, Kurian, MD  buPROPion (WELLBUTRIN SR) 100 MG 12 hr tablet Take 1 tablet (100 mg total) by mouth daily after breakfast. 05/08/19   Eappen, Saramma, MD  chlorhexidine gluconate, MEDLINE KIT, (PERIDEX) 0.12 % solution 15 mLs by Mouth Rinse route 2 (two) times daily. 06/16/2019   Kasa, Kurian, MD  clonazePAM (KLONOPIN) 0.25 MG disintegrating tablet Hold due to low blood pressure. 06/14/19   Lai, Tina, MD  colchicine 0.6 MG tablet Place 1 tablet (0.6 mg total) into feeding tube daily. 06/14/19   Kasa, Kurian, MD  dextromethorphan-guaiFENesin (MUCINEX DM) 30-600 MG 12hr tablet Take 1 tablet by mouth 2 (two) times daily. 06/14/19   Lai, Tina, MD  diclofenac sodium (VOLTAREN) 1 % GEL Apply 4 g topically 3 (three) times daily. knees    [provider]  dicyclomine (BENTYL) 10 MG capsule Take 10 mg by mouth 3 (three) times daily.     [provider]  donepezil (ARICEPT) 10 MG tablet Take 10 mg by mouth at bedtime.  11/29/18   [provider]  enoxaparin  (LOVENOX) 40 MG/0.4ML injection Inject 0.4 mLs (40 mg total) into the skin daily. 06/14/19   Lai, Tina, MD  fentaNYL 10 mcg/ml SOLN infusion Inject 0-400 mcg/hr into the vein continuous. 06/14/19   Kasa, Kurian, MD  ferrous sulfate 325 (65 FE) MG tablet Take 325 mg by mouth 2 (two)   times daily.     [provider]  furosemide (LASIX) 40 MG tablet Hold due to low blood pressure. 06/14/19   Lai, Tina, MD  gabapentin (NEURONTIN) 300 MG capsule Take 1-2 capsules (300-600 mg total) by mouth 3 (three) times daily. Takes 300 mg at 8 am , 2pm, Taking 600 mg 8 pm. 06/14/19   Lai, Tina, MD  hydrochlorothiazide (HYDRODIURIL) 25 MG tablet Hold due to low blood pressure. 06/14/19   Lai, Tina, MD  hydrocortisone 2.5 % cream Apply 1 application topically daily. To back of neck    [provider]  hydrOXYzine (ATARAX/VISTARIL) 10 MG tablet Take 10 mg by mouth every 8 (eight) hours as needed for itching.  03/22/19   [provider]  insulin aspart (NOVOLOG) 100 UNIT/ML injection Inject 0-9 Units into the skin 3 (three) times daily with meals. 06/14/19   Lai, Tina, MD  insulin glargine (LANTUS) 100 UNIT/ML injection Inject 0.15 mLs (15 Units total) into the skin every evening. 06/14/19   Lai, Tina, MD  insulin lispro (HUMALOG) 100 UNIT/ML injection Hold while inpatient. 06/14/19   Lai, Tina, MD  lactulose (CHRONULAC) 10 GM/15ML solution Take 10 g by mouth daily. (0800) 10/17/16   [provider]  lamoTRIgine (LAMICTAL) 150 MG tablet Take 1 tablet (150 mg total) by mouth 2 (two) times daily. 05/08/19   Eappen, Saramma, MD  lamoTRIgine (LAMICTAL) 150 MG tablet Place 1 tablet (150 mg total) into feeding tube 2 (two) times daily. 06/14/19   Kasa, Kurian, MD  lidocaine (LIDODERM) 5 % Place 1 patch onto the skin daily.     [provider]  liraglutide (VICTOZA) 18 MG/3ML SOPN Hold while inpatient. 06/14/19   Lai, Tina, MD  methylPREDNISolone sodium succinate (SOLU-MEDROL) 40 mg/mL injection Inject 1  mL (40 mg total) into the vein every 8 (eight) hours. 06/14/19   Lai, Tina, MD  methylPREDNISolone sodium succinate (SOLU-MEDROL) 40 mg/mL injection Inject 1 mL (40 mg total) into the vein every 12 (twelve) hours. 06/26/2019   Kasa, Kurian, MD  metoCLOPramide (REGLAN) 5 MG tablet Take 5 mg by mouth 3 (three) times daily before meals.     [provider]  norepinephrine (LEVOPHED) 4-5 MG/250ML-% SOLN Inject 0-40 mcg/min into the vein continuous. To keep systolic >100. 06/14/19   Lai, Tina, MD  Nutritional Supplements (FEEDING SUPPLEMENT, VITAL HIGH PROTEIN,) LIQD liquid Place 1,000 mLs into feeding tube daily. 06/14/19   Kasa, Kurian, MD  nystatin (MYCOSTATIN/NYSTOP) powder Apply topically 4 (four) times daily as needed. 06/14/19   Lai, Tina, MD  omeprazole (PRILOSEC) 20 MG capsule Take 20 mg by mouth daily.     [provider]  ondansetron (ZOFRAN) 4 MG tablet Take 1 tablet (4 mg total) by mouth 3 (three) times daily with meals as needed for nausea. 06/14/19   Lai, Tina, MD  pantoprazole (PROTONIX) 40 MG injection Inject 40 mg into the vein every 12 (twelve) hours. 06/14/19   Lai, Tina, MD  potassium chloride SA (K-DUR) 20 MEQ tablet Take 20 mEq by mouth daily.  12/27/18   [provider]  pregabalin (LYRICA) 100 MG capsule Take 100 mg by mouth at bedtime.  04/30/19   [provider]  pregabalin (LYRICA) 75 MG capsule Take 75 mg by mouth 2 (two) times daily.  02/11/19   [provider]  RESTASIS 0.05 % ophthalmic emulsion Place 1 drop into both eyes every 12 (twelve) hours as needed (dry eyes).  12/22/18   [provider]    rOPINIRole (REQUIP) 3 MG tablet Take 3 mg by mouth at bedtime. (2000)    [provider]  senna (GERI-KOT) 8.6 MG tablet Take 2 tablets by mouth at bedtime.     [provider]  sertraline (ZOLOFT) 25 MG tablet Take 1 tablet (25 mg total) by mouth daily. 05/08/19   Eappen, Saramma, MD  simvastatin (ZOCOR) 10 MG tablet Take 10 mg by  mouth at bedtime.    [provider]  thiamine 100 MG tablet Place 1 tablet (100 mg total) into feeding tube daily. 06/14/19   Kasa, Kurian, MD  zinc sulfate 220 (50 Zn) MG capsule Place 1 capsule (220 mg total) into feeding tube daily. 06/14/19   Kasa, Kurian, MD     Critical care time: I have independently seen and examined the patient, reviewed data, and developed an assessment and plan. A total of 44 minutes were spent in critical care assessment and medical decision making. This critical care time does not reflect procedure time, or teaching time or supervisory time of PA/NP/Med student/Med Resident, etc but could involve care discussion time.   C. , MD PhD 06/28/2019 11:49 AM           

## 2019-06-15 NOTE — Progress Notes (Signed)
CRITICAL CARE NOTE  SYNOPSIS  69 y.o. Caucasianfemalewith medical history significant ofhypertension, hyperlipidemia, diabetes mellitus, COPD, asthma, stroke, GERD, depression with anxiety, thrombocytopenia, liver cirrhosis, hepatitis B 20 years ago, bipolar disorder, iron deficiency anemia, CHF, who presents with fever, cough shortness of breath.  Admitted for Acute hypoxic respiratory failure due to COVID-19 PNA --In the ED, Patient has oxygen desaturation to 70% on room air, with bilateral multifocal infiltration on chest x-ray. Remdesivir started, but steroid not started on admission2/2 reported allergy to cortisone.IV decadron started on 1/6 which pt tolerated.  In least 24 hrs, patient with worsening hypoxia and increased WOB Patient was emergently intubated CVL placed   CC  follow up respiratory failure  SUBJECTIVE Patient remains critically ill Prognosis is guarded   BP (!) 98/56   Pulse 79   Temp 99.5 F (37.5 C) (Oral)   Resp 17   Ht 5' 3"  (1.6 m)   Wt 100.1 kg   SpO2 94%   BMI 39.09 kg/m    I/O last 3 completed shifts: In: 1086 [P.O.:480; I.V.:381.5; NG/GT:67.3; IV Piggyback:157.2] Out: 410 [Urine:410] No intake/output data recorded.  SpO2: 94 % O2 Flow Rate (L/min): 50 L/min FiO2 (%): 100 %   SIGNIFICANT EVENTS 1/8 intubated, sedated on vent 1/8 CVL placed, sister, family  Updated, plan to transfer to [place] REVIEW OF SYSTEMS DUE TO SEVERE CRITICAL ILLNESS   PHYSICAL EXAMINATION:  GENERAL:critically ill appearing, +resp distress HEAD: Normocephalic, atraumatic.  EYES: Pupils equal, round, reactive to light.  No scleral icterus.  MOUTH: Moist mucosal membrane. NECK: Supple.  PULMONARY: +rhonchi, +wheezing CARDIOVASCULAR: S1 and S2. Regular rate and rhythm. No murmurs, rubs, or gallops.  GASTROINTESTINAL: Soft, nontender, -distended.  Positive bowel sounds.   MUSCULOSKELETAL: No  swelling, clubbing, or edema.  NEUROLOGIC: obtunded, GCS<8 SKIN:intact,warm,dry  MEDICATIONS: I have reviewed all medications and confirmed regimen as documented   CULTURE RESULTS   Recent Results (from the past 240 hour(s))  Culture, blood (Routine X 2) w Reflex to ID Panel     Status: None (Preliminary result)   Collection Time: 06/11/19  7:00 PM   Specimen: BLOOD  Result Value Ref Range Status   Specimen Description BLOOD BLOOD LEFT ARM  Final   Special Requests   Final    BOTTLES DRAWN AEROBIC AND ANAEROBIC Blood Culture results may not be optimal due to an excessive volume of blood received in culture bottles   Culture   Final    NO GROWTH 4 DAYS Performed at [facility], [address] [place], [place] [address]    Report Status PENDING  Incomplete  Culture, blood (Routine X 2) w Reflex to ID Panel     Status: None (Preliminary result)   Collection Time: 06/11/19  7:30 PM   Specimen: BLOOD  Result Value Ref Range Status   Specimen Description BLOOD LEFT ANTECUBITAL  Final   Special Requests   Final    BOTTLES DRAWN AEROBIC AND ANAEROBIC Blood Culture results may not be optimal due to an excessive volume of blood received in culture bottles   Culture   Final    NO GROWTH 4 DAYS Performed at [facility], [address], [place], [place] [address]    Report Status PENDING  Incomplete  MRSA PCR Screening     Status: None   Collection Time: 06/12/19  6:05 PM   Specimen: Nasopharyngeal  Result Value Ref Range Status   MRSA  by PCR NEGATIVE NEGATIVE Final    Comment:        The GeneXpert MRSA Assay (FDA approved for NASAL specimens only), is one component of a comprehensive MRSA colonization surveillance program. It is not intended to diagnose MRSA infection nor to guide or monitor treatment for MRSA infections. Performed at [facility], [address] [place], [place] [address]           IMAGING    DG Abd 1 View  Result  Date: 06/14/2019 CLINICAL DATA:  Check gastric catheter placement EXAM: ABDOMEN - 1 VIEW COMPARISON:  None. FINDINGS: Scattered large and small bowel gas is noted. Gastric catheter is noted coiled within the distal stomach. Postsurgical changes are seen. IMPRESSION: Gastric catheter within the stomach. Electronically Signed   By: [name] Catalina M.D.   On: 06/14/2019 17:51   DG Chest Port 1 View  Result Date: 06/14/2019 CLINICAL DATA:  Status post intubation EXAM: PORTABLE CHEST 1 VIEW COMPARISON:  06/11/2019 FINDINGS: Cardiac shadow is enlarged but stable. Endotracheal tube is noted 6 mm above the carina. This should be withdrawn 1-2 cm. Gastric catheter is noted within the stomach. Left jugular central line is seen at the cavoatrial junction. Scattered parenchymal opacities are increased when compared with the prior exam. These are consistent with the given clinical history of COVID-19 positivity. No other focal abnormality is seen. IMPRESSION: Status post intubation. The endotracheal tube is at the level of the carina and should be withdrawn 1-2 cm. Scattered parenchymal opacities increased from the prior exam consistent with the given clinical history. Gastric catheter and left jugular line in satisfactory position. Electronically Signed   By: [name] Catalina M.D.   On: 06/14/2019 17:51       Indwelling Urinary Catheter continued, requirement due to   Reason to continue Indwelling Urinary Catheter strict Intake/Output monitoring for hemodynamic instability   Central Line/ continued, requirement due to  Reason to continue [place] of central venous pressure or other hemodynamic parameters and poor IV access   Ventilator continued, requirement due to severe respiratory failure   Ventilator Sedation RASS 0 to -2      ASSESSMENT AND PLAN SYNOPSIS Severe ACUTE Hypoxic and Hypercapnic Respiratory Failure from COVID 19 pneumonia with ARDS -continue Full MV support -continue  Bronchodilator Therapy -Wean Fio2 and PEEP as tolerated   Severe COVID-19 infection, ARDS and pneumonia/pneumonitis Continue IV steroids  IV remdisivir Continue proning as tolerated due to severe hypoxia   Maintain airborne and contact precautions  As needed bronchodilators (MDI) Vitamin C and zinc Antitussives High risk for intubation and death  NEUROLOGY - intubated and sedated - minimal sedation to achieve a RASS goal: -1 Wake up assessment pending  CARDIAC ICU monitoring  ID -continue IV abx as prescibed -follow up cultures  GI GI PROPHYLAXIS as indicated  NUTRITIONAL STATUS DIET-->TF's as tolerated Constipation protocol as indicated  ENDO - will use ICU hypoglycemic\Hyperglycemia protocol if indicated   ELECTROLYTES -follow labs as needed -replace as needed -pharmacy consultation and following   DVT/GI PRX ordered TRANSFUSIONS AS NEEDED MONITOR FSBS ASSESS the need for LABS as needed   Critical Care Time devoted to patient care services described in this note is 43 minutes.   Overall, patient is critically ill, prognosis is guarded.  Patient with Multiorgan failure and at high risk for cardiac arrest and death.    Corrin Parker, M.D.  [facility]  Medical Director [place] Director [facility]  Cardio-Pulmonary Department

## 2019-06-15 NOTE — Progress Notes (Signed)
Brief Nutrition Note  Consult received for enteral/tube feeding initiation and management.   Adult Enteral Nutrition Protocol initiated. Full assessment to follow.  Admitting Dx: Acute respiratory failure with hypercapnia ([place]) [J96.02]  There is no height or weight on file to calculate BMI.  Labs:  Recent Labs  Lab 06/13/19 0235 06/14/19 0506 06/09/2019 0346 06/16/2019 1026  NA 139 141 143 143  K 3.8 3.8 4.1 4.1  CL 104 106 106  --   CO2 27 27 26   --   BUN 20 29* 38*  --   CREATININE 0.80 0.95 1.23*  --   CALCIUM 8.2* 8.2* 8.4*  --   MG 2.2 2.5* 2.5*  --   GLUCOSE 182* 166* 283*  --     Gaynell Face, MS, RD, LDN Inpatient Clinical Dietitian Pager: [phone] Weekend/After Hours: [phone]

## 2019-06-15 NOTE — Progress Notes (Signed)
Patient was to be transferred to [facility] ICU. Contacted Dr. Wallis Bamberg via E-Link to complete the EMTALA Provider Reassessment (which needs to be completed within one hour of departure). Provider is unable to complete the form at this time. Transfer of the patient was cancelled and [facility] was updated. Will continue to monitor patient.  Cameron Ali, RN
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
## 2019-06-15 NOTE — Progress Notes (Signed)
Report called to [facility] for transfer to [facility]

## 2019-06-15 NOTE — Progress Notes (Signed)
Pt admitted from [facility] ER at 1030. Pt arrived on ventilator and infusions of levophed and fentanyl. Pt responds to voice by opening eyes. Belongings at bedside: pt has 2 pair of earrings in her ears, and silver bracelet on her right wrist, and 2 silver rings on her left hand. Pt has a pair of pants, a shirt, a bra, a cellphone and a Games developer. And a pair of socks. Ellamae Sia

## 2019-06-15 NOTE — Progress Notes (Signed)
pts POA Tammie called and updated her. Tammie states patient has the paperwork somewhere. Pt also has a sister named Tammy, she stated pt would want her sister to have information. Pts POA oriented to unit policy of RN calling her with updates each shift and MD to call daily. Valinda Party

## 2019-06-15 NOTE — Progress Notes (Signed)
Report called to [facility] at [facility]. [facility] in route with patient. Left message for friend, Tammie.

## 2019-06-16 LAB — CBC WITH DIFFERENTIAL/PLATELET
Abs Immature Granulocytes: 0.06 10*3/uL (ref 0.00–0.07)
Basophils Absolute: 0 10*3/uL (ref 0.0–0.1)
Basophils Relative: 0 %
Eosinophils Absolute: 0 10*3/uL (ref 0.0–0.5)
Eosinophils Relative: 0 %
HCT: 35.5 % — ABNORMAL LOW (ref 36.0–46.0)
Hemoglobin: 11 g/dL — ABNORMAL LOW (ref 12.0–15.0)
Immature Granulocytes: 1 %
Lymphocytes Relative: 2 %
Lymphs Abs: 0.2 10*3/uL — ABNORMAL LOW (ref 0.7–4.0)
MCH: 29.9 pg (ref 26.0–34.0)
MCHC: 31 g/dL (ref 30.0–36.0)
MCV: 96.5 fL (ref 80.0–100.0)
Monocytes Absolute: 0.8 10*3/uL (ref 0.1–1.0)
Monocytes Relative: 9 %
Neutro Abs: 7.2 10*3/uL (ref 1.7–7.7)
Neutrophils Relative %: 88 %
Platelets: 146 10*3/uL — ABNORMAL LOW (ref 150–400)
RBC: 3.68 MIL/uL — ABNORMAL LOW (ref 3.87–5.11)
RDW: 15.7 % — ABNORMAL HIGH (ref 11.5–15.5)
WBC: 8.2 10*3/uL (ref 4.0–10.5)
nRBC: 0.5 % — ABNORMAL HIGH (ref 0.0–0.2)

## 2019-06-16 LAB — COMPREHENSIVE METABOLIC PANEL
ALT: 13 U/L (ref 0–44)
AST: 26 U/L (ref 15–41)
Albumin: 2.8 g/dL — ABNORMAL LOW (ref 3.5–5.0)
Alkaline Phosphatase: 111 U/L (ref 38–126)
Anion gap: 11 (ref 5–15)
BUN: 64 mg/dL — ABNORMAL HIGH (ref 8–23)
CO2: 28 mmol/L (ref 22–32)
Calcium: 8.5 mg/dL — ABNORMAL LOW (ref 8.9–10.3)
Chloride: 108 mmol/L (ref 98–111)
Creatinine, Ser: 1.6 mg/dL — ABNORMAL HIGH (ref 0.44–1.00)
GFR calc Af Amer: 38 mL/min — ABNORMAL LOW (ref >60)
GFR calc non Af Amer: 33 mL/min — ABNORMAL LOW (ref >60)
Glucose, Bld: 171 mg/dL — ABNORMAL HIGH (ref 70–99)
Potassium: 3.9 mmol/L (ref 3.5–5.1)
Sodium: 147 mmol/L — ABNORMAL HIGH (ref 135–145)
Total Bilirubin: 1 mg/dL (ref 0.3–1.2)
Total Protein: 6.2 g/dL — ABNORMAL LOW (ref 6.5–8.1)

## 2019-06-16 LAB — PHOSPHORUS
Phosphorus: 1.9 mg/dL — ABNORMAL LOW (ref 2.5–4.6)
Phosphorus: 3.1 mg/dL (ref 2.5–4.6)

## 2019-06-16 LAB — GLUCOSE, CAPILLARY
Glucose-Capillary: 174 mg/dL — ABNORMAL HIGH (ref 70–99)
Glucose-Capillary: 203 mg/dL — ABNORMAL HIGH (ref 70–99)
Glucose-Capillary: 270 mg/dL — ABNORMAL HIGH (ref 70–99)
Glucose-Capillary: 302 mg/dL — ABNORMAL HIGH (ref 70–99)
Glucose-Capillary: 306 mg/dL — ABNORMAL HIGH (ref 70–99)
Glucose-Capillary: 320 mg/dL — ABNORMAL HIGH (ref 70–99)

## 2019-06-16 LAB — POCT I-STAT 7, (LYTES, BLD GAS, ICA,H+H)
Acid-base deficit: 2 mmol/L (ref 0.0–2.0)
Bicarbonate: 25 mmol/L (ref 20.0–28.0)
Calcium, Ion: 1.19 mmol/L (ref 1.15–1.40)
HCT: 30 % — ABNORMAL LOW (ref 36.0–46.0)
Hemoglobin: 10.2 g/dL — ABNORMAL LOW (ref 12.0–15.0)
O2 Saturation: 87 %
Potassium: 4.1 mmol/L (ref 3.5–5.1)
Sodium: 144 mmol/L (ref 135–145)
TCO2: 26 mmol/L (ref 22–32)
pCO2 arterial: 49.9 mmHg — ABNORMAL HIGH (ref 32.0–48.0)
pH, Arterial: 7.308 — ABNORMAL LOW (ref 7.350–7.450)
pO2, Arterial: 58 mmHg — ABNORMAL LOW (ref 83.0–108.0)

## 2019-06-16 LAB — CULTURE, BLOOD (ROUTINE X 2)
Culture: NO GROWTH
Culture: NO GROWTH

## 2019-06-16 LAB — MAGNESIUM
Magnesium: 2.5 mg/dL — ABNORMAL HIGH (ref 1.7–2.4)
Magnesium: 2.4 mg/dL (ref 1.7–2.4)

## 2019-06-16 MED ORDER — PANTOPRAZOLE SODIUM 40 MG PO PACK
40.0000 mg | PACK | Freq: Every day | ORAL | Status: DC
  Administered 2019-06-16 – 2019-06-24 (×9): 40 mg
  Filled 2019-06-16 (×9): qty 20

## 2019-06-16 MED ORDER — VANCOMYCIN HCL 2000 MG/400ML IV SOLN
2000.0000 mg | Freq: Once | INTRAVENOUS | Status: AC
  Administered 2019-06-16: 2000 mg via INTRAVENOUS
  Filled 2019-06-16: qty 400

## 2019-06-16 MED ORDER — ACETAMINOPHEN 650 MG RE SUPP
650.0000 mg | RECTAL | Status: DC | PRN
  Administered 2019-06-16: 04:00:00 650 mg via RECTAL
  Filled 2019-06-16: qty 1

## 2019-06-16 MED ORDER — SODIUM CHLORIDE 0.9 % IV SOLN
2.0000 g | Freq: Two times a day (BID) | INTRAVENOUS | Status: DC
  Administered 2019-06-16 – 2019-06-18 (×5): 2 g via INTRAVENOUS
  Filled 2019-06-16 (×5): qty 2

## 2019-06-16 MED ORDER — VANCOMYCIN HCL 1750 MG/350ML IV SOLN
1750.0000 mg | INTRAVENOUS | Status: DC
  Filled 2019-06-16: qty 350

## 2019-06-16 MED ORDER — POTASSIUM PHOSPHATES 15 MMOLE/5ML IV SOLN
20.0000 mmol | Freq: Once | INTRAVENOUS | Status: AC
  Administered 2019-06-16: 20 mmol via INTRAVENOUS
  Filled 2019-06-16: qty 6.67

## 2019-06-16 MED ORDER — ACETAMINOPHEN 325 MG PO TABS: 650.0000 mg | ORAL_TABLET | ORAL | Status: DC | PRN

## 2019-06-16 MED ORDER — CHLORHEXIDINE GLUCONATE 0.12 % MT SOLN
15.0000 mL | Freq: Two times a day (BID) | OROMUCOSAL | Status: DC
  Administered 2019-06-16 – 2019-06-24 (×17): 15 mL via OROMUCOSAL
  Filled 2019-06-16 (×13): qty 15

## 2019-06-16 NOTE — Progress Notes (Signed)
At the end of the shift yesterday evening, (1945) pts sister Audree Bane called and stated patient does not have any other sisters and that Spiro are friends. I was unable to reach [place] last night. Today Melissa called and when RN informed her Camille Bal had called , Melissa then changed her story and said " Oh no, I told you I was like a niece to her. " Lenna Sciara understands she is not to call and will no longer receive information per sister Audree Bane. Ellamae Sia

## 2019-06-16 NOTE — Progress Notes (Signed)
Proned at this time, tolerated well

## 2019-06-16 NOTE — Progress Notes (Signed)
 NAME:  Stephanie [name], MRN:  [id number], DOB:  10/15/1950, LOS: 1 ADMISSION DATE:  07/07/2019, CONSULTATION DATE:  06/30/2019 REFERRING MD:  Kasa, CHIEF COMPLAINT:  ARDS/resp failure   Brief History   68 y.o. Caucasianfemalewith medical history significant ofhypertension, hyperlipidemia, diabetes mellitus, COPD, asthma, stroke, GERD, depression with anxiety, thrombocytopenia, liver cirrhosis, hepatitis B 20 years ago, bipolar disorder, iron deficiency anemia, CHF, transferred from [facility] this a.m. with respiratory failure 2/2 COVID  History of present illness   68 y.o. Caucasianfemalewith medical history significant ofhypertension, hyperlipidemia, diabetes mellitus, COPD, asthma, stroke, GERD, depression with anxiety, thrombocytopenia, liver cirrhosis, hepatitis B 20 years ago, bipolar disorder, iron deficiency anemia, CHF, admitted to [facility] on 06/11/2019 from her skilled nursing facility having tested positive for Covid 3 days prior, but having worsening shortness of breath and oxygen saturations in the 70s. Treatment with remdesivir and steroids was begun, but the patient became more hypoxic on 06/14/2019, ultimately requiring intubation, placement of a central line, and transferred 1/9 to [facility] for escalated level of care. Past Medical History  -- Adenomatous colon polyp (08/27/2014),  --Anemia -- Anxiety,  --Aortic dissection (HCC), Arthritis, Asthma, Bipolar affective disorder (HCC), Blood transfusion (2000), Blood transfusion without reported diagnosis, Cancer (HCC), Chronic abdominal pain, Cirrhosis (HCC), Colon polyp, COPD (chronic obstructive pulmonary disease) (HCC), Depression, Diabetes mellitus without complication (HCC), Fatty liver (04/09/08), GERD (gastroesophageal reflux disease), H/O: CVA (cardiovascular accident), H/O: rheumatic fever, Hepatitis, Hyperlipidemia, Hypertension, IBS (irritable bowel syndrome), Incontinence, Insomnia, Lower extremity edema, Neuropathy, Obstructive  sleep apnea, Platelets decreased (HCC), RLS (restless legs syndrome), Shortness of breath, Sleep apnea, Stroke (HCC), Ulcer, Unspecified disorders of nervous system, and Wears dentures. PSH:  Cholecystectomy;  Rotator cuff repair;  Abdominal hysterectomy;  Lumbar fusion (10/09);  Hemorroidectomy;  Abdominal exploration surgery;  Posterior cervical fusion/foraminotomy;  Lumbar wound debridement (05/09/2011);  Axillary artery cannulation via 8-mm Hemashield graft, median sternotomy, extracorporeal circulation with deep hypothermic circulatory arrest, repair of  aortic dessection (01/23/2009);  cataract;  Colonoscopy;  Liver biopsy (04/10/2012); Back surgery; Eye surgery (Bilateral); Knee arthroscopy with lateral release (Right, 11/15/2016); Facial laceration repair (Left, 04/11/2018); Colonoscopy with propofol (N/A, 02/26/2019); and Esophagogastroduodenoscopy (egd) with propofol (02/26/2019).  Significant Hospital Events   1/9 intubation for hypoxic resp failure   Consults:  PCCM  Procedures:  1/9 Intubation 1/9 L IJ TLC  Significant Diagnostic Tests:    Micro Data:  Blood cx neg x4 days   Antimicrobials:  n/a   Interim history/subjective:  Fevers not responsive to tylenol, requiring more pressor support.   Objective   Blood pressure (!) 106/38, pulse 89, temperature (!) 102.2 F (39 C), resp. rate 17, weight 100 kg, SpO2 98 %.    Vent Mode: PRVC FiO2 (%):  [70 %-75 %] 70 % Set Rate:  [30 bmp] 30 bmp Vt Set:  [400 mL] 400 mL PEEP:  [14 cmH20] 14 cmH20 Plateau Pressure:  [28 cmH20-32 cmH20] 28 cmH20   Intake/Output Summary (Last 24 hours) at 06/16/2019 1111 Last data filed at 06/16/2019 0700 Gross per 24 hour  Intake 899.03 ml  Output 475 ml  Net 424.03 ml   Filed Weights   06/16/19 0448  Weight: 100 kg    Examination: General: NAD HENT: AT/[place]; ETT in place Lungs: crackles diffusely throughout Cardiovascular: RRR, no r/m/g Abdomen: SND Extremities: no  c/c/e Neuro: PERRLA, EOMI, w/d to nox stim all 4 extrem GU: foley, urine clear   Resolved Hospital Problem list   none  Assessment & Plan:  Severe ACUTE   Hypoxic and Hypercapnic Respiratory Failure from COVID 19 pneumonia with ARSA -continue Full MV support -continue Bronchodilator Therapy -ARDS protocol, wean FiO2 and other parameters as able   SHOCK-SEPSIS/HYPOVOLUMIC PLAN: -use vasopressors to keep MAP>65 -follow ABG and LA -new blood and trach aspirate cultures today -Start Vancomycin, cefepime empirically  Severe COVID-19 infection, ARDS and pneumonia/pneumonitis PLAN: --Continue IV steroids (SSI for any resultant hyperglycemia)  --IV remdisivir --P/F ratio right now does not meet criteria for proning   --As needed bronchodilators (MDI) --Vitamin C and zinc --repeat ferritin, CRP, draw d-dimer 1/11  ID: febrile, hypotensive requiring ice packs, pressor -IV abx as prescibed -new cultures today as above  F/E/N: DIET-->TF's as tolerated Constipation protocol as indicated BUN/Cr increasing-->renal dose medicines -replete Phos   ENDO - hypoglycemic\Hyperglycemia protocol if needed    Best practice:  Diet: TF Pain/Anxiety/Delirium protocol (if indicated): in place VAP protocol (if indicated): yes DVT prophylaxis: lovenox, GI prophylaxis: PPI, HOB elevated Glucose control: SSI Mobility: bed Code Status: full Family Communication: *see nursing notes*; very unclear who POA/NOK is. Case management involved Disposition: ICU  Labs   CBC: Recent Labs  Lab 06/12/19 0130 06/13/19 0235 06/14/19 0506 07/07/2019 0346 06/28/2019 1026 06/17/2019 1452 06/16/19 0434  WBC 2.3* 2.2* 4.2 13.7*  --   --  8.2  NEUTROABS 1.8 1.7 3.5 11.1*  --   --  7.2  HGB 10.7* 11.0* 10.7* 12.0 12.2 11.6* 11.0*  HCT 32.5* 33.4* 33.3* 37.6 36.0 34.0* 35.5*  MCV 91.0 92.0 92.8 94.7  --   --  96.5  PLT 79* 101* 126* 219  --   --  146*    Basic Metabolic Panel: Recent Labs  Lab  06/12/19 0130 06/13/19 0235 06/14/19 0506 07/02/2019 0346 06/14/2019 1026 06/14/2019 1452 06/13/2019 1715 06/16/19 0434  NA 135 139 141 143 143 144  --  147*  K 3.5 3.8 3.8 4.1 4.1 4.0  --  3.9  CL 100 104 106 106  --   --   --  108  CO2 27 27 27 26  --   --   --  28  GLUCOSE 149* 182* 166* 283*  --   --   --  171*  BUN 13 20 29* 38*  --   --   --  64*  CREATININE 0.84 0.80 0.95 1.23*  --   --   --  1.60*  CALCIUM 8.0* 8.2* 8.2* 8.4*  --   --   --  8.5*  MG 2.1 2.2 2.5* 2.5*  --   --  2.5* 2.5*  PHOS  --   --   --   --   --   --  3.2 1.9*   GFR: Estimated Creatinine Clearance: 37.9 mL/min (A) (by C-G formula based on SCr of 1.6 mg/dL (H)). Recent Labs  Lab 06/11/19 1236 06/12/19 0030 06/13/19 0235 06/14/19 0506 06/27/2019 0346 06/16/19 0434  PROCALCITON <0.10  --   --   --   --   --   WBC 2.1*  --  2.2* 4.2 13.7* 8.2  LATICACIDVEN 1.4 1.1  --   --   --   --     Liver Function Tests: Recent Labs  Lab 06/12/19 0130 06/13/19 0235 06/14/19 0506 06/16/2019 0346 06/16/19 0434  AST 41 42* 37 35 26  ALT 16 15 12 15 13  ALKPHOS 121 118 111 130* 111  BILITOT 0.9 1.1 1.0 1.3* 1.0  PROT 6.2* 6.1* 6.1* 6.9 6.2*  ALBUMIN 3.0* 2.9*   2.8* 3.2* 2.8*   No results for input(s): LIPASE, AMYLASE in the last 168 hours. Recent Labs  Lab 06/11/19 1930  AMMONIA 13    ABG    Component Value Date/Time   PHART 7.357 06/13/2019 1452   PCO2ART 48.8 (H) 06/17/2019 1452   PO2ART 146.0 (H) 07/04/2019 1452   HCO3 27.4 06/11/2019 1452   TCO2 29 06/14/2019 1452   O2SAT 99.0 07/04/2019 1452     Coagulation Profile: No results for input(s): INR, PROTIME in the last 168 hours.  Cardiac Enzymes: No results for input(s): CKTOTAL, CKMB, CKMBINDEX, TROPONINI in the last 168 hours.  HbA1C: Hemoglobin A1C  Date/Time Value Ref Range Status  09/15/2015 12:00 AM 7.0  Final  04/04/2013 04:26 AM 6.0 4.2 - 6.3 % Final    Comment:    The American Diabetes Association recommends that a primary goal  of therapy should be <7% and that physicians should reevaluate the treatment regimen in patients with HbA1c values consistently >8%.    Hgb A1c MFr Bld  Date/Time Value Ref Range Status  11/20/2018 10:52 AM 5.6 4.8 - 5.6 % Final    Comment:    (NOTE) Pre diabetes:          5.7%-6.4% Diabetes:              >6.4% Glycemic control for   <7.0% adults with diabetes   07/20/2016 04:28 AM 5.2 4.8 - 5.6 % Final    Comment:    (NOTE)         Pre-diabetes: 5.7 - 6.4         Diabetes: >6.4         Glycemic control for adults with diabetes: <7.0     CBG: Recent Labs  Lab 06/14/2019 1610 06/13/2019 2000 07/04/2019 2319 06/16/19 0336 06/16/19 0842  GLUCAP 355* 378* 354* 174* 203*    Review of Systems:   Unable to obtain; pt intubated, sedated (mildly)  Past Medical History  Stephanie [name],  has a past medical history of Adenomatous colon polyp (08/27/2014), Anemia, Anxiety, Aortic dissection (HCC), Arthritis, Asthma, Bipolar affective disorder ([place]), Blood transfusion (2000), Blood transfusion without reported diagnosis, Cancer ([place]), Chronic abdominal pain, Cirrhosis ([place]), Colon polyp, COPD (chronic obstructive pulmonary disease) ([place]), Depression, Diabetes mellitus without complication ([place]), Fatty liver (04/09/08), GERD (gastroesophageal reflux disease), H/O: CVA (cardiovascular accident), H/O: rheumatic fever, Hepatitis, Hyperlipidemia, Hypertension, IBS (irritable bowel syndrome), Incontinence, Insomnia, Lower extremity edema, Neuropathy, Obstructive sleep apnea, Platelets decreased ([place]), RLS (restless legs syndrome), Shortness of breath, Sleep apnea, Stroke ([place]), Ulcer, Unspecified disorders of nervous system, and Wears dentures.   Surgical History    Past Surgical History:  Procedure Laterality Date  . ABDOMINAL EXPLORATION SURGERY    . ABDOMINAL HYSTERECTOMY     total  . Axillary artery cannulation via 8-mm Hemashield graft, median sternotomy, extracorporeal circulation with deep  hypothermic circulatory arrest, repair of  aortic dessection  01/23/2009   Dr Arlyce Dice  . BACK SURGERY      x 5  . cataract     both eyes  . CHOLECYSTECTOMY    . COLONOSCOPY    . COLONOSCOPY WITH PROPOFOL N/A 02/26/2019   Procedure: COLONOSCOPY WITH PROPOFOL;  Surgeon: Lucilla Lame, MD;  Location[facility];  Service: Endoscopy;  Laterality: N/A;  . ESOPHAGOGASTRODUODENOSCOPY (EGD) WITH PROPOFOL  02/26/2019   Procedure: ESOPHAGOGASTRODUODENOSCOPY (EGD) WITH PROPOFOL;  Surgeon: Lucilla Lame, MD;  Location: [facility] ENDOSCOPY;  Service: Endoscopy;;  . EYE SURGERY Bilateral    Cataract Extraction with  IOL  . FACIAL LACERATION REPAIR Left 04/11/2018   Procedure: EARLOBE REPAIR;  Surgeon: Carloyn Manner, MD;  Location: [facility];  Service: ENT;  Laterality: Left;  Diabetic - insulin and oral meds sleep apnea  . HEMORROIDECTOMY     and colon polyp removed  . KNEE ARTHROSCOPY WITH LATERAL RELEASE Right 11/15/2016   Procedure: KNEE ARTHROSCOPY WITH LATERAL RELEASE;  Surgeon: Corky Mull, MD;  Location: [facility];  Service: Orthopedics;  Laterality: Right;  . LIVER BIOPSY  04/10/2012   Procedure: LIVER BIOPSY;  Surgeon: Inda Castle, MD;  Location: [facility] ENDOSCOPY;  Service: Endoscopy;  Laterality: N/A;  ultrasound to mark order for abd limited/liver to be marked to be sent by [email or web address]   . LUMBAR FUSION  10/09  . LUMBAR WOUND DEBRIDEMENT  05/09/2011   Procedure: LUMBAR WOUND DEBRIDEMENT;  Surgeon: Dahlia Bailiff;  Location: [place];  Service: Orthopedics;  Laterality: N/A;  IRRIGATION AND DEBRIDEMENT SPINAL WOUND  . POSTERIOR CERVICAL FUSION/FORAMINOTOMY    . ROTATOR CUFF REPAIR     left     Social History   reports that Stephanie [name] quit smoking about 6 years ago. Her smoking use included cigarettes. Stephanie [name] has a 75.00 pack-year smoking history. Stephanie [name] has never used smokeless tobacco. Stephanie [name] reports that Stephanie [name] does not drink alcohol or use drugs.   Family History   Her family history includes  Anxiety disorder in her brother; Breast cancer in her mother; Depression in her brother; Diabetes in an other family member; Lung cancer in her mother; Stomach cancer in her father. There is no history of Colon cancer, Esophageal cancer, or Rectal cancer.   Allergies Allergies  Allergen Reactions  . Cortisone     Increased BS  . Doxycycline Hives, Swelling, Other (See Comments) and Nausea Only    Reaction:  Facial swelling  Face red and swollen; no difficulty breathing  . Morphine Other (See Comments)     Home Medications  Prior to Admission medications   Medication Sig Start Date End Date Taking? Authorizing Provider  albuterol (VENTOLIN HFA) 108 (90 Base) MCG/ACT inhaler Inhale 2 puffs into the lungs every 6 (six) hours as needed for wheezing or shortness of breath. 04/22/19   Chesley Mires, MD  albuterol (VENTOLIN HFA) 108 (90 Base) MCG/ACT inhaler Inhale 2 puffs into the lungs every 6 (six) hours as needed for wheezing or shortness of breath.    [provider]  Amino Acids-Protein Hydrolys (FEEDING SUPPLEMENT, PRO-STAT SUGAR FREE 64,) LIQD Place 30 mLs into feeding tube 2 (two) times daily. 06/14/19   Flora Lipps, MD  ascorbic acid (VITAMIN C) 500 MG tablet Place 1 tablet (500 mg total) into feeding tube 2 (two) times daily. 06/14/19   Flora Lipps, MD  B Complex-C (B-COMPLEX WITH VITAMIN C) tablet Place 1 tablet into feeding tube daily. 06/14/19   Flora Lipps, MD  buPROPion (WELLBUTRIN SR) 100 MG 12 hr tablet Take 1 tablet (100 mg total) by mouth daily after breakfast. 05/08/19   Ursula Alert, MD  chlorhexidine gluconate, MEDLINE KIT, (PERIDEX) 0.12 % solution 15 mLs by Mouth Rinse route 2 (two) times daily.    Flora Lipps, MD  clonazePAM (KLONOPIN) 0.25 MG disintegrating tablet Hold due to low blood pressure. 06/14/19   Enzo Bi, MD  colchicine 0.6 MG tablet Place 1 tablet (0.6 mg total) into feeding tube daily. 06/14/19   Flora Lipps, MD  dextromethorphan-guaiFENesin  (MUCINEX DM) 30-600 MG 12hr tablet Take 1 tablet by mouth 2 (two) times  daily. 06/14/19   Enzo Bi, MD  diclofenac sodium (VOLTAREN) 1 % GEL Apply 4 g topically 3 (three) times daily. knees    [provider]  dicyclomine (BENTYL) 10 MG capsule Take 10 mg by mouth 3 (three) times daily.     [provider]  donepezil (ARICEPT) 10 MG tablet Take 10 mg by mouth at bedtime.  11/29/18   [provider]  enoxaparin (LOVENOX) 40 MG/0.4ML injection Inject 0.4 mLs (40 mg total) into the skin daily. 06/14/19   Enzo Bi, MD  fentaNYL 10 mcg/ml SOLN infusion Inject 0-400 mcg/hr into the vein continuous. 06/14/19   Flora Lipps, MD  ferrous sulfate 325 (65 FE) MG tablet Take 325 mg by mouth 2 (two) times daily.     [provider]  furosemide (LASIX) 40 MG tablet Hold due to low blood pressure. 06/14/19   Enzo Bi, MD  gabapentin (NEURONTIN) 300 MG capsule Take 1-2 capsules (300-600 mg total) by mouth 3 (three) times daily. Takes 300 mg at 8 am , 2pm, Taking 600 mg 8 pm. 06/14/19   Enzo Bi, MD  hydrochlorothiazide (HYDRODIURIL) 25 MG tablet Hold due to low blood pressure. 06/14/19   Enzo Bi, MD  hydrocortisone 2.5 % cream Apply 1 application topically daily. To back of neck    [provider]  hydrOXYzine (ATARAX/VISTARIL) 10 MG tablet Take 10 mg by mouth every 8 (eight) hours as needed for itching.  03/22/19   [provider]  insulin aspart (NOVOLOG) 100 UNIT/ML injection Inject 0-9 Units into the skin 3 (three) times daily with meals. 06/14/19   Enzo Bi, MD  insulin glargine (LANTUS) 100 UNIT/ML injection Inject 0.15 mLs (15 Units total) into the skin every evening. 06/14/19   Enzo Bi, MD  insulin lispro (HUMALOG) 100 UNIT/ML injection Hold while inpatient. 06/14/19   Enzo Bi, MD  lactulose (CHRONULAC) 10 GM/15ML solution Take 10 g by mouth daily. (0800) 10/17/16   [provider]  lamoTRIgine (LAMICTAL) 150 MG tablet Take 1 tablet (150 mg total) by  mouth 2 (two) times daily. 05/08/19   Ursula Alert, MD  lamoTRIgine (LAMICTAL) 150 MG tablet Place 1 tablet (150 mg total) into feeding tube 2 (two) times daily. 06/14/19   Flora Lipps, MD  lidocaine (LIDODERM) 5 % Place 1 patch onto the skin daily.     [provider]  liraglutide (VICTOZA) 18 MG/3ML SOPN Hold while inpatient. 06/14/19   Enzo Bi, MD  methylPREDNISolone sodium succinate (SOLU-MEDROL) 40 mg/mL injection Inject 1 mL (40 mg total) into the vein every 8 (eight) hours. 06/14/19   Enzo Bi, MD  methylPREDNISolone sodium succinate (SOLU-MEDROL) 40 mg/mL injection Inject 1 mL (40 mg total) into the vein every 12 (twelve) hours. 06/12/2019   Flora Lipps, MD  metoCLOPramide (REGLAN) 5 MG tablet Take 5 mg by mouth 3 (three) times daily before meals.     [provider]  norepinephrine (LEVOPHED) 4-5 MG/250ML-% SOLN Inject 0-40 mcg/min into the vein continuous. To keep systolic >740. 01/05/43   Enzo Bi, MD  Nutritional Supplements (FEEDING SUPPLEMENT, VITAL HIGH PROTEIN,) LIQD liquid Place 1,000 mLs into feeding tube daily. 06/14/19   Flora Lipps, MD  nystatin (MYCOSTATIN/NYSTOP) powder Apply topically 4 (four) times daily as needed. 06/14/19   Enzo Bi, MD  omeprazole (PRILOSEC) 20 MG capsule Take 20 mg by mouth daily.     [provider]  ondansetron (ZOFRAN) 4 MG tablet Take 1 tablet (4 mg total) by mouth 3 (three)  times daily with meals as needed for nausea. 06/14/19   Enzo Bi, MD  pantoprazole (PROTONIX) 40 MG injection Inject 40 mg into the vein every 12 (twelve) hours. 06/14/19   Enzo Bi, MD  potassium chloride SA (K-DUR) 20 MEQ tablet Take 20 mEq by mouth daily.  12/27/18   [provider]  pregabalin (LYRICA) 100 MG capsule Take 100 mg by mouth at bedtime.  04/30/19   [provider]  pregabalin (LYRICA) 75 MG capsule Take 75 mg by mouth 2 (two) times daily.  02/11/19   [provider]  RESTASIS 0.05 % ophthalmic emulsion Place 1 drop into  both eyes every 12 (twelve) hours as needed (dry eyes).  12/22/18   [provider]  rOPINIRole (REQUIP) 3 MG tablet Take 3 mg by mouth at bedtime. (2000)    [provider]  senna (GERI-KOT) 8.6 MG tablet Take 2 tablets by mouth at bedtime.     [provider]  sertraline (ZOLOFT) 25 MG tablet Take 1 tablet (25 mg total) by mouth daily. 05/08/19   Ursula Alert, MD  simvastatin (ZOCOR) 10 MG tablet Take 10 mg by mouth at bedtime.    [provider]  thiamine 100 MG tablet Place 1 tablet (100 mg total) into feeding tube daily. 06/14/19   Flora Lipps, MD  zinc sulfate 220 (50 Zn) MG capsule Place 1 capsule (220 mg total) into feeding tube daily. 06/14/19   Flora Lipps, MD     Critical care time: I have independently seen and examined the patient, reviewed data, and developed an assessment and plan. A total of 44 minutes were spent in critical care assessment and medical decision making. This critical care time does not reflect procedure time, or teaching time or supervisory time of PA/NP/Med student/Med Resident, etc but could involve care discussion time.  Bonna Gains, MD PhD 06/16/19 11:30 AM

## 2019-06-16 NOTE — Progress Notes (Signed)
All jewelry (3 rings, 4 earrings, 1 bracelet) placed in bag. Given to secretary to be placed in hospital safe.

## 2019-06-16 NOTE — Progress Notes (Signed)
Pt proned without complications.  ETT secured with clothtape.

## 2019-06-16 NOTE — Progress Notes (Signed)
Pharmacy Antibiotic Note  Stephanie [name] is a 69 y.o. female admitted on 06/10/2019 with sepsis.  Pharmacy has been consulted for Cefepime and vancomycin dosing. Patient spiking fevers today although WBC wnl. SCr up to 1.6 today   Plan: -Start Cefepime 2 gm IV Q 12 hours -Vancomycin 2 gm IV once, then start Vancomycin 1750 mg IV Q 48 hrs. Goal AUC 400-550. Expected AUC: 487 SCr used: 1.6  -Monitor CBC, renal fx, cultures and clinical progress -Vanc levels at steady state    Weight: 220 lb 7.4 oz (100 kg)(bed scale not working)  Temp (24hrs), Avg:100 F (37.8 C), Min:98.5 F (36.9 C), Max:102.2 F (39 C)  Recent Labs  Lab 06/11/19 1236 06/12/19 0030 06/12/19 0130 06/13/19 0235 06/14/19 0506 06/29/2019 0346 06/16/19 0434  WBC 2.1*  --  2.3* 2.2* 4.2 13.7* 8.2  CREATININE 1.08*  --  0.84 0.80 0.95 1.23* 1.60*  LATICACIDVEN 1.4 1.1  --   --   --   --   --     Estimated Creatinine Clearance: 37.9 mL/min (A) (by C-G formula based on SCr of 1.6 mg/dL (H)).    Allergies  Allergen Reactions  . Cortisone     Increased BS  . Doxycycline Hives, Swelling, Other (See Comments) and Nausea Only    Reaction:  Facial swelling  Face red and swollen; no difficulty breathing  . Morphine Other (See Comments)    Antimicrobials this admission: Cefepime 1/10 >>  Vanc 1/10 >>   Dose adjustments this admission: None   Microbiology results: 1/5 BCx: NegF 1/10 BCx>> Sent  1/10 Sputum: Sent  1/6 MRSA PCR: neg   Thank you for allowing pharmacy to be a part of this patient's care.  Albertina Parr, PharmD., BCPS Clinical Pharmacist Clinical phone for 06/16/19 until 5pm: [phone]

## 2019-06-17 DIAGNOSIS — U071 COVID-19: Secondary | ICD-10-CM

## 2019-06-17 DIAGNOSIS — R739 Hyperglycemia, unspecified: Secondary | ICD-10-CM

## 2019-06-17 DIAGNOSIS — J80 Acute respiratory distress syndrome: Secondary | ICD-10-CM

## 2019-06-17 LAB — CBC WITH DIFFERENTIAL/PLATELET
Abs Immature Granulocytes: 0.45 10*3/uL — ABNORMAL HIGH (ref 0.00–0.07)
Basophils Absolute: 0 10*3/uL (ref 0.0–0.1)
Basophils Relative: 1 %
Eosinophils Absolute: 0 10*3/uL (ref 0.0–0.5)
Eosinophils Relative: 0 %
HCT: 32.6 % — ABNORMAL LOW (ref 36.0–46.0)
Hemoglobin: 10 g/dL — ABNORMAL LOW (ref 12.0–15.0)
Immature Granulocytes: 7 %
Lymphocytes Relative: 5 %
Lymphs Abs: 0.3 10*3/uL — ABNORMAL LOW (ref 0.7–4.0)
MCH: 30.1 pg (ref 26.0–34.0)
MCHC: 30.7 g/dL (ref 30.0–36.0)
MCV: 98.2 fL (ref 80.0–100.0)
Monocytes Absolute: 0.4 10*3/uL (ref 0.1–1.0)
Monocytes Relative: 6 %
Neutro Abs: 5.3 10*3/uL (ref 1.7–7.7)
Neutrophils Relative %: 81 %
Platelets: 149 10*3/uL — ABNORMAL LOW (ref 150–400)
RBC: 3.32 MIL/uL — ABNORMAL LOW (ref 3.87–5.11)
RDW: 15.9 % — ABNORMAL HIGH (ref 11.5–15.5)
WBC: 6.5 10*3/uL (ref 4.0–10.5)
nRBC: 1.1 % — ABNORMAL HIGH (ref 0.0–0.2)

## 2019-06-17 LAB — MAGNESIUM: Magnesium: 2.5 mg/dL — ABNORMAL HIGH (ref 1.7–2.4)

## 2019-06-17 LAB — POCT I-STAT 7, (LYTES, BLD GAS, ICA,H+H)
Acid-Base Excess: 4 mmol/L — ABNORMAL HIGH (ref 0.0–2.0)
Bicarbonate: 29 mmol/L — ABNORMAL HIGH (ref 20.0–28.0)
Calcium, Ion: 1.24 mmol/L (ref 1.15–1.40)
HCT: 26 % — ABNORMAL LOW (ref 36.0–46.0)
Hemoglobin: 8.8 g/dL — ABNORMAL LOW (ref 12.0–15.0)
O2 Saturation: 95 %
Patient temperature: 37.3
Potassium: 4.2 mmol/L (ref 3.5–5.1)
Sodium: 148 mmol/L — ABNORMAL HIGH (ref 135–145)
TCO2: 30 mmol/L (ref 22–32)
pCO2 arterial: 46.3 mmHg (ref 32.0–48.0)
pH, Arterial: 7.406 (ref 7.350–7.450)
pO2, Arterial: 77 mmHg — ABNORMAL LOW (ref 83.0–108.0)

## 2019-06-17 LAB — GLUCOSE, CAPILLARY
Glucose-Capillary: 261 mg/dL — ABNORMAL HIGH (ref 70–99)
Glucose-Capillary: 260 mg/dL — ABNORMAL HIGH (ref 70–99)
Glucose-Capillary: 313 mg/dL — ABNORMAL HIGH (ref 70–99)
Glucose-Capillary: 287 mg/dL — ABNORMAL HIGH (ref 70–99)
Glucose-Capillary: 314 mg/dL — ABNORMAL HIGH (ref 70–99)

## 2019-06-17 LAB — COMPREHENSIVE METABOLIC PANEL
ALT: 16 U/L (ref 0–44)
AST: 44 U/L — ABNORMAL HIGH (ref 15–41)
Albumin: 2.4 g/dL — ABNORMAL LOW (ref 3.5–5.0)
Alkaline Phosphatase: 82 U/L (ref 38–126)
Anion gap: 10 (ref 5–15)
BUN: 74 mg/dL — ABNORMAL HIGH (ref 8–23)
CO2: 27 mmol/L (ref 22–32)
Calcium: 8.2 mg/dL — ABNORMAL LOW (ref 8.9–10.3)
Chloride: 110 mmol/L (ref 98–111)
Creatinine, Ser: 1.43 mg/dL — ABNORMAL HIGH (ref 0.44–1.00)
GFR calc Af Amer: 44 mL/min — ABNORMAL LOW (ref >60)
GFR calc non Af Amer: 38 mL/min — ABNORMAL LOW (ref >60)
Glucose, Bld: 305 mg/dL — ABNORMAL HIGH (ref 70–99)
Potassium: 4.1 mmol/L (ref 3.5–5.1)
Sodium: 147 mmol/L — ABNORMAL HIGH (ref 135–145)
Total Bilirubin: 1.7 mg/dL — ABNORMAL HIGH (ref 0.3–1.2)
Total Protein: 5.7 g/dL — ABNORMAL LOW (ref 6.5–8.1)

## 2019-06-17 LAB — D-DIMER, QUANTITATIVE: D-Dimer, Quant: 3.06 ug/mL-FEU — ABNORMAL HIGH (ref 0.00–0.50)

## 2019-06-17 LAB — C-REACTIVE PROTEIN: CRP: 30.4 mg/dL — ABNORMAL HIGH (ref <1.0)

## 2019-06-17 LAB — FERRITIN: Ferritin: 322 ng/mL — ABNORMAL HIGH (ref 11–307)

## 2019-06-17 LAB — MRSA PCR SCREENING: MRSA by PCR: NEGATIVE

## 2019-06-17 MED ORDER — INSULIN DETEMIR 100 UNIT/ML ~~LOC~~ SOLN
10.0000 [IU] | Freq: Two times a day (BID) | SUBCUTANEOUS | Status: DC
  Administered 2019-06-17 (×2): 10 [IU] via SUBCUTANEOUS
  Filled 2019-06-17 (×3): qty 0.1

## 2019-06-17 MED ORDER — VITAL 1.5 CAL PO LIQD
1000.0000 mL | ORAL | Status: DC
  Administered 2019-06-17 – 2019-06-18 (×2): 1000 mL

## 2019-06-17 MED ORDER — PRO-STAT SUGAR FREE PO LIQD
30.0000 mL | Freq: Three times a day (TID) | ORAL | Status: DC
  Administered 2019-06-17 – 2019-06-24 (×20): 30 mL
  Filled 2019-06-17 (×19): qty 30

## 2019-06-17 MED ORDER — POLYETHYLENE GLYCOL 3350 17 G PO PACK
17.0000 g | PACK | Freq: Every day | ORAL | Status: DC
  Administered 2019-06-17 – 2019-06-24 (×8): 17 g via ORAL
  Filled 2019-06-17 (×8): qty 1

## 2019-06-17 MED ORDER — FREE WATER
200.0000 mL | Freq: Three times a day (TID) | Status: DC
  Administered 2019-06-17 – 2019-06-19 (×7): 200 mL

## 2019-06-17 NOTE — Progress Notes (Signed)
Spoke with Anette Riedel who states she is the patient's POA and also sister in law. However, there is no POA documentation provided and Tammie states she does not know where it is at but the facility should have it. Also received a call from [facility] who states she is the patient's sister. Tammy Floy is not listed on the patient's chart at this time so no information was given out. Reached out to CM in regards to who is patient's point of contact since patient is currently sedated and intubated. I contacted [facility], which is where the patient resided prior to this admission. The nurse I spoke with there states that the patient does not have a POA on file and has noted her self to make her own decisions. However, the facility does have Tammie Esque listed as a point of contact. Notified Alma Friendly, [facility] of this situation and also updated patient's nurse Gearlean Alf. Any future calls for this patient will be directed to [place], [facility] at this time.

## 2019-06-17 NOTE — Progress Notes (Addendum)
NAME:  Stephanie [name], MRN:  [id number], DOB:  1951-02-27, LOS: 2 ADMISSION DATE:  07/07/2019, CONSULTATION DATE:  06/30/2019 REFERRING MD:  Mortimer Fries, CHIEF COMPLAINT:  ARDS/resp failure   Brief History   69 y/o [name] to [facility] after testing positive for COVID on 1/2 at her nursing facility.  She was found to have hypoxic respiratory failure with saturations in the 70's on presentation. She was treated with remdesivir & steroids but had worsening respiratory failure requiring intubation on 1/8 and placement of central line. She was transferred to [facility] on 1/9 for care.    Past Medical History  HTN HLD DM  COPD / Asthma  CVA GERD Bipolar Depression  IDA  Hepatitis B  Cirrhosis  Rheumatic Fever  HLD OSA RLS   Significant Hospital Events   1/09 Tx to [place], COVID +, intubated  1/10 Fevers not responsive to tylenol, requiring more pressor support.  1/11 Prone, Peak 30/Pplat 29  Consults:  PCCM  Procedures:  ETT 1/9 >>  L IJ TLC 1/9 >>   Significant Diagnostic Tests:    Micro Data:  BCx2 1/10 >>  Tracheal aspirate 1/10 >> abundant staph aureus >>   Antimicrobials:  Vanco 1/10 >>  Cefepime 1/10 >>    Interim history/subjective:  Tmax 100.4 / WBC 6.5 Staph in tracheal aspirate, on vanco / cefepime  On levophed 2 mcg (reduced from last 24 hours) Fentanyl gtt with PRN versed  PEEP 14 / FiO2 50%, Peak airway pressure 29 / Pplat 27 Remains prone  I/O - 1.2L UOP, 2L + for 24 hours  Objective   Blood pressure (!) 131/49, pulse 70, temperature (!) 100.4 F (38 C), resp. rate (!) 25, weight 100 kg, SpO2 96 %.    Vent Mode: PRVC FiO2 (%):  [50 %-70 %] 50 % Set Rate:  [30 bmp] 30 bmp Vt Set:  [400 mL] 400 mL PEEP:  [14 cmH20] 14 cmH20 Plateau Pressure:  [27 cmH20-31 cmH20] 27 cmH20   Intake/Output Summary (Last 24 hours) at 06/17/2019 1341 Last data filed at 06/17/2019 1115 Gross per 24 hour  Intake 3211.1 ml  Output 1300 ml  Net 1911.1 ml   Filed Weights   06/16/19 0448  Weight: 100 kg    Examination: General: adult female lying in bed, critically ill appearing in NAD   HEENT: MM pink/moist, ETT Neuro: sedate on fentanyl CV: s1s2 RRR, no m/r/g PULM:  Synchronous, lungs bilaterally coarse GI: soft, bsx4 active  Extremities: warm/dry, trace generalized edema  Skin: no rashes or lesions  Resolved Hospital Problem list      Assessment & Plan:   Acute Hypoxic and Hypercapnic Respiratory Failure from COVID 19 PNA with MRSA Completed remdesivir.  -follow tracheal aspirate for final sensitivities  -continue vancomycin for now  -ARDS protocol  -low Vt ventilation 4-8cc/kg -goal plateau pressure <30, driving pressure <53 cm H2O -target PaO2 55-65, titrate PEEP/FiO2 per ARDS protocol  -if P/F ratio <150, prone therapy for 16 hours per day -goal CVP <4, diuresis as necessary -VAP prevention measures  -follow intermittent CXR  -continue decadron -follow inflammatory markers  Septic Shock  Suspect element of sedation / hypovolemia on admit.  -wean levophed for MAP >65 -follow cultures as above  -continue abx as above   AKI  -Trend BMP / urinary output -Replace electrolytes as indicated -Avoid nephrotoxic agents, ensure adequate renal perfusion -continue free water per tube   Anemia  Mild Thrombocytopenia  -trend CBC  -transfuse for Hgb <7%  or active bleeding   DM with Steroid Induced Hyperglycemia  -SSI , moderate scale -levemir 10 units BID    Best practice:  Diet: TF Pain/Anxiety/Delirium protocol (if indicated): in place VAP protocol (if indicated): yes DVT prophylaxis: lovenox, GI prophylaxis: PPI, HOB elevated Glucose control: SSI Mobility: bed Code Status: full Family Communication: *see nursing notes*; very unclear POA/NOK. Case management involved.  Will await further information from CM/nursing supervisor before calling other listed numbers in chart.  Disposition: ICU  Addendum 1648 - [name] was  named on advanced directive in 2018 (confirmed documented in [facility] system).  In the ER at [facility] the patient asked that Driscilla Grammes be updated.  Both are listed as contacts.    Labs   CBC: Recent Labs  Lab 06/13/19 0235 06/14/19 0506 07/02/2019 0346 06/12/2019 1026 07/06/2019 1452 06/16/19 0434 06/16/19 1631 06/17/19 0400  WBC 2.2* 4.2 13.7*  --   --  8.2  --  6.5  NEUTROABS 1.7 3.5 11.1*  --   --  7.2  --  5.3  HGB 11.0* 10.7* 12.0 12.2 11.6* 11.0* 10.2* 10.0*  HCT 33.4* 33.3* 37.6 36.0 34.0* 35.5* 30.0* 32.6*  MCV 92.0 92.8 94.7  --   --  96.5  --  98.2  PLT 101* 126* 219  --   --  146*  --  149*    Basic Metabolic Panel: Recent Labs  Lab 06/13/19 0235 06/14/19 0506 06/08/2019 0346 06/09/2019 1026 06/14/2019 1452 07/01/2019 1715 06/16/19 0434 06/16/19 1631 06/16/19 1843 06/17/19 0400  NA 139 141 143 143 144  --  147* 144  --  147*  K 3.8 3.8 4.1 4.1 4.0  --  3.9 4.1  --  4.1  CL 104 106 106  --   --   --  108  --   --  110  CO2 27 27 26   --   --   --  28  --   --  27  GLUCOSE 182* 166* 283*  --   --   --  171*  --   --  305*  BUN 20 29* 38*  --   --   --  64*  --   --  74*  CREATININE 0.80 0.95 1.23*  --   --   --  1.60*  --   --  1.43*  CALCIUM 8.2* 8.2* 8.4*  --   --   --  8.5*  --   --  8.2*  MG 2.2 2.5* 2.5*  --   --  2.5* 2.5*  --  2.4 2.5*  PHOS  --   --   --   --   --  3.2 1.9*  --  3.1  --    GFR: Estimated Creatinine Clearance: 42.4 mL/min (A) (by C-G formula based on SCr of 1.43 mg/dL (H)). Recent Labs  Lab 06/11/19 1236 06/12/19 0030 06/14/19 0506 06/10/2019 0346 06/16/19 0434 06/17/19 0400  PROCALCITON <0.10  --   --   --   --   --   WBC 2.1*  --  4.2 13.7* 8.2 6.5  LATICACIDVEN 1.4 1.1  --   --   --   --     Liver Function Tests: Recent Labs  Lab 06/13/19 0235 06/14/19 0506 07/04/2019 0346 06/16/19 0434 06/17/19 0400  AST 42* 37 35 26 44*  ALT 15 12 15 13 16   ALKPHOS 118 111 130* 111 82  BILITOT 1.1 1.0 1.3* 1.0 1.7*  PROT  6.1* 6.1* 6.9 6.2* 5.7*    ALBUMIN 2.9* 2.8* 3.2* 2.8* 2.4*   No results for input(s): LIPASE, AMYLASE in the last 168 hours. Recent Labs  Lab 06/11/19 1930  AMMONIA 13    ABG    Component Value Date/Time   PHART 7.308 (L) 06/16/2019 1631   PCO2ART 49.9 (H) 06/16/2019 1631   PO2ART 58.0 (L) 06/16/2019 1631   HCO3 25.0 06/16/2019 1631   TCO2 26 06/16/2019 1631   ACIDBASEDEF 2.0 06/16/2019 1631   O2SAT 87.0 06/16/2019 1631     Coagulation Profile: No results for input(s): INR, PROTIME in the last 168 hours.  Cardiac Enzymes: No results for input(s): CKTOTAL, CKMB, CKMBINDEX, TROPONINI in the last 168 hours.  HbA1C: Hemoglobin A1C  Date/Time Value Ref Range Status  09/15/2015 12:00 AM 7.0  Final  04/04/2013 04:26 AM 6.0 4.2 - 6.3 % Final    Comment:    The American Diabetes Association recommends that a primary goal of therapy should be <7% and that physicians should reevaluate the treatment regimen in patients with HbA1c values consistently >8%.    Hgb A1c MFr Bld  Date/Time Value Ref Range Status  11/20/2018 10:52 AM 5.6 4.8 - 5.6 % Final    Comment:    (NOTE) Pre diabetes:          5.7%-6.4% Diabetes:              >6.4% Glycemic control for   <7.0% adults with diabetes   07/20/2016 04:28 AM 5.2 4.8 - 5.6 % Final    Comment:    (NOTE)         Pre-diabetes: 5.7 - 6.4         Diabetes: >6.4         Glycemic control for adults with diabetes: <7.0     CBG: Recent Labs  Lab 06/16/19 1952 06/16/19 2330 06/17/19 0337 06/17/19 0755 06/17/19 1134  GLUCAP 306* 320* 261* 260* 313*    Critical care time: 61 minutes    Noe Gens, MSN, NP-C [facility] 06/17/2019, 1:42 PM   Please see [email or web address] for pager details.

## 2019-06-17 NOTE — Progress Notes (Signed)
Attempted to call Stephanie [name] to give update, no answer. Will try again at another time

## 2019-06-17 NOTE — Progress Notes (Signed)
[name] updated on pt at this time. All questions and concerns answered.

## 2019-06-17 NOTE — Progress Notes (Signed)
Pt placed supine at this time with no complications. Ett secured with cloth in proper position. VS within normal limits.

## 2019-06-17 NOTE — Progress Notes (Signed)
Initial Nutrition Assessment  DOCUMENTATION CODES:   Obesity unspecified  INTERVENTION:    Vital 1.5 at 40 ml/h (960 ml per day)   Pro-stat 30 ml TID   Provides 1740 kcal, 110 gm protein, 733 ml free water daily  NUTRITION DIAGNOSIS:   Increased nutrient needs related to acute illness(COVID 19) as evidenced by estimated needs.  GOAL:   Patient will meet greater than or equal to 90% of their needs  MONITOR:   Vent status, TF tolerance, Skin, Labs  REASON FOR ASSESSMENT:   Ventilator, Consult Enteral/tube feeding initiation and management  ASSESSMENT:   69 yo female admitted with ARDS, respiratory failure r/t COVID-19. PMH includes HTN, HLD, DM, COPD, asthma, stroke, GERD, depression, anxiety, bipolar disorder, iron deficiency anemia, CHF. Patient was hospitalized at [facility] from 1/5 until transfer to [facility] on 1/9. She tested positive for COVID on 1/1. She lives in a nursing facility and is dependent for most ADLs.   Received MD Consult for TF initiation and management. Currently receiving Vital High Protein at 40 ml/h with Pro-stat 30 ml BID per Adult Tube Feeding Protocol.    Patient is currently intubated on ventilator support, currently requiring proning as well as one pressor.  MV: 10.2 L/min Temp (24hrs), Avg:100.4 F (38 C), Min:97.8 F (36.6 C), Max:100.8 F (38.2 C)   Labs reviewed. Sodium 147 (H), magnesium 2.5 (H) CBG's: 306-320-261-260  Medications reviewed and include vitamin C, decadron, novolog, levemir, zinc sulfate, levophed.  Weight documented yesterday 100 kg with a comment stating "bed scale not working." Usual weights range from 95.5 kg to 105.2 kg over the past 9 months.   NUTRITION - FOCUSED PHYSICAL EXAM:  unable to complete due to COVID restrictions  Diet Order:   Diet Order    None      EDUCATION NEEDS:   Not appropriate for education at this time  Skin:  Skin Assessment: Reviewed RN Assessment  Last BM:  1/7 type 6  Height:    Ht Readings from Last 1 Encounters:  06/12/19 5' 3"  (1.6 m)    Weight:   Wt Readings from Last 1 Encounters:  06/16/19 100 kg    Ideal Body Weight:  52.3 kg  BMI:  Body mass index is 39.05 kg/m.  Estimated Nutritional Needs:   Kcal:  1700-2100  Protein:  >/= 105 gm  Fluid:  >/= 1.8 L    Molli Barrows, RD, LDN, [place] Pager [phone] After Hours Pager [phone]

## 2019-06-17 NOTE — Progress Notes (Signed)
Inpatient Diabetes Program Recommendations  AACE/ADA: New Consensus Statement on Inpatient Glycemic Control (2015)  Target Ranges:  Prepandial:   less than 140 mg/dL      Peak postprandial:   less than 180 mg/dL (1-2 hours)      Critically ill patients:  140 - 180 mg/dL   Lab Results  Component Value Date   GLUCAP 260 (H) 06/17/2019   HGBA1C 5.6 11/20/2018    Review of Glycemic Control Results for Stephanie [name], Stephanie [name] (MRN [id number]) as of 06/17/2019 11:07  Ref. Range 06/16/2019 15:38 06/16/2019 19:52 06/16/2019 23:30 06/17/2019 03:37 06/17/2019 07:55  Glucose-Capillary Latest Ref Range: 70 - 99 mg/dL 302 (H) 306 (H) 320 (H) 261 (H) 260 (H)   Diabetes history: DM 2 Outpatient Diabetes medications: Lantus 15 units q PM, Novolog 0-9 units tid with meals Current orders for Inpatient glycemic control:  Levemir 10 units bid, Novolog moderate q 4 hours Vital 1.5 cal-40 ml/hr  Inpatient Diabetes Program Recommendations:    Please add Novolog tube feed coverage 4 units q 4 hours.   Thanks Adah Perl, RN, BC-ADM Inpatient Diabetes Coordinator Pager [phone] (8a-5p)

## 2019-06-17 NOTE — Care Management (Addendum)
CM acknowledges consult regarding POC.  CM called [facility] and left VM for [employer] requesting call back.  Per bedside nurse POA documentation/verification has not yet been provided from any resource.  CM will request clarity from facility on pts POC as discrepancies have arose with multiple community contacts requesting status updates.    Update:  CM found Advance Directive in Media tab of Epic chart.  Documents list Stephanie [name] as pts [place] dated for 07/24/16.

## 2019-06-17 NOTE — Progress Notes (Signed)
Pt hear turned and arms rotated. ETT secured.

## 2019-06-18 ENCOUNTER — Inpatient Hospital Stay (HOSPITAL_COMMUNITY): Payer: Medicare Other

## 2019-06-18 ENCOUNTER — Ambulatory Visit: Payer: Medicare Other | Admitting: Licensed Clinical Social Worker

## 2019-06-18 DIAGNOSIS — N179 Acute kidney failure, unspecified: Secondary | ICD-10-CM

## 2019-06-18 LAB — CBC WITH DIFFERENTIAL/PLATELET
Abs Immature Granulocytes: 0.31 10*3/uL — ABNORMAL HIGH (ref 0.00–0.07)
Basophils Absolute: 0 10*3/uL (ref 0.0–0.1)
Basophils Relative: 1 %
Eosinophils Absolute: 0 10*3/uL (ref 0.0–0.5)
Eosinophils Relative: 0 %
HCT: 29.9 % — ABNORMAL LOW (ref 36.0–46.0)
Hemoglobin: 9.4 g/dL — ABNORMAL LOW (ref 12.0–15.0)
Immature Granulocytes: 5 %
Lymphocytes Relative: 5 %
Lymphs Abs: 0.3 10*3/uL — ABNORMAL LOW (ref 0.7–4.0)
MCH: 30.8 pg (ref 26.0–34.0)
MCHC: 31.4 g/dL (ref 30.0–36.0)
MCV: 98 fL (ref 80.0–100.0)
Monocytes Absolute: 0.6 10*3/uL (ref 0.1–1.0)
Monocytes Relative: 9 %
Neutro Abs: 4.9 10*3/uL (ref 1.7–7.7)
Neutrophils Relative %: 80 %
Platelets: 132 10*3/uL — ABNORMAL LOW (ref 150–400)
RBC: 3.05 MIL/uL — ABNORMAL LOW (ref 3.87–5.11)
RDW: 15.7 % — ABNORMAL HIGH (ref 11.5–15.5)
WBC: 6.2 10*3/uL (ref 4.0–10.5)
nRBC: 0.6 % — ABNORMAL HIGH (ref 0.0–0.2)

## 2019-06-18 LAB — CULTURE, RESPIRATORY W GRAM STAIN

## 2019-06-18 LAB — GLUCOSE, CAPILLARY
Glucose-Capillary: 344 mg/dL — ABNORMAL HIGH (ref 70–99)
Glucose-Capillary: 364 mg/dL — ABNORMAL HIGH (ref 70–99)
Glucose-Capillary: 347 mg/dL — ABNORMAL HIGH (ref 70–99)
Glucose-Capillary: 318 mg/dL — ABNORMAL HIGH (ref 70–99)
Glucose-Capillary: 360 mg/dL — ABNORMAL HIGH (ref 70–99)
Glucose-Capillary: 352 mg/dL — ABNORMAL HIGH (ref 70–99)

## 2019-06-18 LAB — BASIC METABOLIC PANEL
Anion gap: 8 (ref 5–15)
BUN: 76 mg/dL — ABNORMAL HIGH (ref 8–23)
CO2: 28 mmol/L (ref 22–32)
Calcium: 8.3 mg/dL — ABNORMAL LOW (ref 8.9–10.3)
Chloride: 111 mmol/L (ref 98–111)
Creatinine, Ser: 1.05 mg/dL — ABNORMAL HIGH (ref 0.44–1.00)
GFR calc Af Amer: >60 mL/min (ref >60)
GFR calc non Af Amer: 55 mL/min — ABNORMAL LOW (ref >60)
Glucose, Bld: 379 mg/dL — ABNORMAL HIGH (ref 70–99)
Potassium: 4.2 mmol/L (ref 3.5–5.1)
Sodium: 147 mmol/L — ABNORMAL HIGH (ref 135–145)

## 2019-06-18 MED ORDER — CEFAZOLIN SODIUM-DEXTROSE 2-4 GM/100ML-% IV SOLN
2.0000 g | Freq: Three times a day (TID) | INTRAVENOUS | Status: AC
  Administered 2019-06-18 – 2019-06-22 (×12): 2 g via INTRAVENOUS
  Filled 2019-06-18 (×13): qty 100

## 2019-06-18 MED ORDER — VECURONIUM BROMIDE 10 MG IV SOLR
10.0000 mg | INTRAVENOUS | Status: DC | PRN
  Administered 2019-06-18 – 2019-06-24 (×27): 10 mg via INTRAVENOUS
  Filled 2019-06-18 (×27): qty 10

## 2019-06-18 MED ORDER — FUROSEMIDE 10 MG/ML IJ SOLN
40.0000 mg | Freq: Once | INTRAMUSCULAR | Status: AC
  Administered 2019-06-18: 40 mg via INTRAVENOUS
  Filled 2019-06-18: qty 4

## 2019-06-18 MED ORDER — INSULIN ASPART 100 UNIT/ML ~~LOC~~ SOLN
4.0000 [IU] | SUBCUTANEOUS | Status: DC
  Administered 2019-06-18 – 2019-06-19 (×6): 4 [IU] via SUBCUTANEOUS

## 2019-06-18 MED ORDER — STERILE WATER FOR INJECTION IJ SOLN
INTRAMUSCULAR | Status: AC
  Filled 2019-06-18: qty 10

## 2019-06-18 MED ORDER — INSULIN DETEMIR 100 UNIT/ML ~~LOC~~ SOLN
20.0000 [IU] | Freq: Two times a day (BID) | SUBCUTANEOUS | Status: DC
  Administered 2019-06-18 (×2): 20 [IU] via SUBCUTANEOUS
  Filled 2019-06-18 (×4): qty 0.2

## 2019-06-18 MED ORDER — SODIUM CHLORIDE 0.9 % IV SOLN
INTRAVENOUS | Status: DC | PRN
  Administered 2019-06-18 – 2019-06-21 (×2): 250 mL via INTRAVENOUS

## 2019-06-18 MED ORDER — INSULIN ASPART 100 UNIT/ML ~~LOC~~ SOLN
0.0000 [IU] | SUBCUTANEOUS | Status: DC
  Administered 2019-06-18: 15 [IU] via SUBCUTANEOUS
  Administered 2019-06-18 (×2): 20 [IU] via SUBCUTANEOUS
  Administered 2019-06-19 (×2): 4 [IU] via SUBCUTANEOUS
  Administered 2019-06-19: 7 [IU] via SUBCUTANEOUS
  Administered 2019-06-19: 11 [IU] via SUBCUTANEOUS
  Administered 2019-06-19 (×2): 15 [IU] via SUBCUTANEOUS
  Administered 2019-06-19: 11 [IU] via SUBCUTANEOUS
  Administered 2019-06-20 (×5): 7 [IU] via SUBCUTANEOUS
  Administered 2019-06-21: 11 [IU] via SUBCUTANEOUS
  Administered 2019-06-21: 17:00:00 15 [IU] via SUBCUTANEOUS
  Administered 2019-06-21: 4 [IU] via SUBCUTANEOUS
  Administered 2019-06-21: 7 [IU] via SUBCUTANEOUS
  Administered 2019-06-21: 15 [IU] via SUBCUTANEOUS
  Administered 2019-06-21: 04:00:00 7 [IU] via SUBCUTANEOUS
  Administered 2019-06-22: 11 [IU] via SUBCUTANEOUS
  Administered 2019-06-22: 15 [IU] via SUBCUTANEOUS
  Administered 2019-06-22: 01:00:00 11 [IU] via SUBCUTANEOUS
  Administered 2019-06-22: 15 [IU] via SUBCUTANEOUS
  Administered 2019-06-22 (×2): 11 [IU] via SUBCUTANEOUS
  Administered 2019-06-23 (×2): 7 [IU] via SUBCUTANEOUS
  Administered 2019-06-23: 15 [IU] via SUBCUTANEOUS
  Administered 2019-06-23 (×2): 7 [IU] via SUBCUTANEOUS
  Administered 2019-06-23: 15 [IU] via SUBCUTANEOUS
  Administered 2019-06-24: 11 [IU] via SUBCUTANEOUS
  Administered 2019-06-24: 05:00:00 4 [IU] via SUBCUTANEOUS
  Administered 2019-06-24 (×3): 7 [IU] via SUBCUTANEOUS

## 2019-06-18 MED ORDER — POTASSIUM CHLORIDE 20 MEQ/15ML (10%) PO SOLN
20.0000 meq | Freq: Once | ORAL | Status: AC
  Administered 2019-06-18: 20 meq
  Filled 2019-06-18: qty 15

## 2019-06-18 MED ORDER — MIDAZOLAM 50MG/50ML (1MG/ML) PREMIX INFUSION
0.5000 mg/h | INTRAVENOUS | Status: DC
  Administered 2019-06-18: 03:00:00 1 mg/h via INTRAVENOUS
  Administered 2019-06-18: 07:00:00 3 mg/h via INTRAVENOUS
  Administered 2019-06-18 – 2019-06-22 (×8): 4 mg/h via INTRAVENOUS
  Administered 2019-06-23: 21:00:00 5 mg/h via INTRAVENOUS
  Administered 2019-06-23 (×2): 4 mg/h via INTRAVENOUS
  Administered 2019-06-24 (×2): 5 mg/h via INTRAVENOUS
  Filled 2019-06-18 (×15): qty 50

## 2019-06-18 NOTE — Progress Notes (Signed)
Family (Tammy)  undated at this time. All questions answered, voiced understanding

## 2019-06-18 NOTE — Progress Notes (Signed)
[name] called for update. No answer.  Will attempt to call again 1/13.    Noe Gens, MSN, NP-C [facility] 06/18/2019, 5:48 PM   Please see [email or web address] for pager details.

## 2019-06-18 NOTE — Progress Notes (Signed)
[facility] Progress Note Patient Name: Stephanie [name] DOB: 02/26/1951 MRN: [id number]   Date of Service  06/18/2019  HPI/Events of Note  Pt remains dyssynchronous despite optimum sedation.  eICU Interventions  PRN [place] ordered.        Kerry Kass Yifan Auker 06/18/2019, 6:56 AM

## 2019-06-18 NOTE — Progress Notes (Signed)
Pt proned at this time with no complications. ETT secured and in proper position with cloth tape. Head turned to left.

## 2019-06-18 NOTE — Progress Notes (Signed)
Fentanyl currently infusing at max rate of 475mg/hr, pt hs frequent breakthrough agitation causing ventilator dyssynchrony and desaturations to the 70-80s. Multiple fentanyl boluses and versed pushes given with minimal improvement. Elink made aware.

## 2019-06-18 NOTE — Progress Notes (Signed)
eLink Physician-Brief Progress Note Patient Name: Stephanie [name] DOB: Nov 30, 1950 MRN: [id number]   Date of Service  06/18/2019  HPI/Events of Note  Ventricular dyssynchrony / sub-optimal sedation on 400 mcg of Fentanyl and PRN versed boluses, RN requesting  Versed infusion.  eICU Interventions  Versed infusion ordered        Frederik Pear 06/18/2019, 3:04 AM

## 2019-06-18 NOTE — Progress Notes (Signed)
NAME:  Stephanie [name], MRN:  [id number], DOB:  Mar 06, 1951, LOS: 3 ADMISSION DATE:  06/19/2019, CONSULTATION DATE:  06/10/2019 REFERRING MD:  Mortimer Fries, CHIEF COMPLAINT:  ARDS/resp failure   Brief History   69 y/o [name] to [facility] after testing positive for COVID on 1/2 at her nursing facility.  She was found to have hypoxic respiratory failure with saturations in the 70's on presentation. She was treated with remdesivir & steroids but had worsening respiratory failure requiring intubation on 1/8 and placement of central line. She was transferred to [facility] on 1/9 for care.    Past Medical History  HTN HLD DM  COPD / Asthma  CVA GERD Bipolar Depression  IDA  Hepatitis B  Cirrhosis  Rheumatic Fever  HLD OSA RLS   Significant Hospital Events   1/09 Tx to [place], COVID +, intubated  1/10 Fevers not responsive to tylenol, requiring more pressor support.  1/11 Prone, Peak 30/Pplat 29   Consults:  PCCM  Procedures:  ETT 1/9 >>  L IJ TLC 1/9 >>   Significant Diagnostic Tests:    Micro Data:  BCx2 1/10 >>  Tracheal aspirate 1/10 >> abundant staph aureus >>   Antimicrobials:  Vanco 1/10 >>  Cefepime 1/10 >>    Interim history/subjective:  Tmax 99.1  MSSA in tracheal aspirate  I/O - UOP 1.7L in last 24h AKI improved  Off pressors Remains on fentanyl, versed. Requiring PRN NMB Peak 27, Pplat 25, PEEP 14 / FiO2 80% (increased from 1/11)  Objective   Blood pressure (!) 117/51, pulse 75, temperature 98.4 F (36.9 C), resp. rate (!) 25, weight 100 kg, SpO2 93 %.    Vent Mode: PRVC FiO2 (%):  [50 %-80 %] 80 % Set Rate:  [25 bmp-30 bmp] 25 bmp Vt Set:  [400 mL] 400 mL PEEP:  [14 cmH20] 14 cmH20 Plateau Pressure:  [25 cmH20] 25 cmH20   Intake/Output Summary (Last 24 hours) at 06/18/2019 1515 Last data filed at 06/18/2019 1400 Gross per 24 hour  Intake 2925.89 ml  Output 1695 ml  Net 1230.89 ml   Filed Weights   06/16/19 0448  Weight: 100 kg     Examination: General: critically ill appearing adult female lying in bed on vent in NAD HEENT: MM pink/moist, ETT  Neuro: sedate CV: s1s2 RRR, no m/r/g PULM:  Periods of dyssynchrony, currently non-labored, lungs bilaterally coarse GI: soft, bsx4 active  Extremities: warm/dry, 1+ generalized edema  Skin: no rashes or lesions  Resolved Hospital Problem list      Assessment & Plan:   Acute Hypoxic and Hypercapnic Respiratory Failure from COVID 19 PNA with MSSA Completed remdesivir.  -narrow ABX to cefazolin. Stop vancomycin  -low Vt ventilation 4-8cc/kg -goal plateau pressure <30, driving pressure <28 cm H2O -target PaO2 55-65, titrate PEEP/FiO2 per ARDS protocol  -P/F ratio <150, continue prone therapy for 16 hours per day -goal CVP <4, diuresis as necessary -VAP prevention measures  -follow intermittent CXR  -continue decadron   Septic Shock  Suspect element of sedation / hypovolemia on admit. Off pressors 1/12 -Cultures as above.  May have been sedation / hypovolemia  AKI  -free water PT  -Trend BMP / urinary output -Replace electrolytes as indicated -Avoid nephrotoxic agents, ensure adequate renal perfusion  Anemia  Mild Thrombocytopenia  -trend CBC -transfuse for Hgb <7% or active bleeding   DM with Steroid Induced Hyperglycemia  -moderate SSI  -continue levemir    Best practice:  Diet: TF Pain/Anxiety/Delirium protocol (  if indicated): in place VAP protocol (if indicated): yes DVT prophylaxis: lovenox, GI prophylaxis: PPI, HOB elevated Glucose control: SSI Mobility: bed Code Status: full Family Communication: Will call [name] for update 11/12.  She was named on advanced directive in 2018 (confirmed documented in [facility] system).  In the ER at [facility] the patient asked that Driscilla Grammes be updated.  Both are listed as contacts.   Disposition: ICU  Labs   CBC: Recent Labs  Lab 06/14/19 0506 06/18/2019 0346 06/16/19 0434 06/16/19 1631  06/17/19 0400 06/17/19 1626 06/18/19 0400  WBC 4.2 13.7* 8.2  --  6.5  --  6.2  NEUTROABS 3.5 11.1* 7.2  --  5.3  --  4.9  HGB 10.7* 12.0 11.0* 10.2* 10.0* 8.8* 9.4*  HCT 33.3* 37.6 35.5* 30.0* 32.6* 26.0* 29.9*  MCV 92.8 94.7 96.5  --  98.2  --  98.0  PLT 126* 219 146*  --  149*  --  132*    Basic Metabolic Panel: Recent Labs  Lab 06/14/19 0506 07/05/2019 0346 06/22/2019 1715 06/16/19 0434 06/16/19 1631 06/16/19 1843 06/17/19 0400 06/17/19 1626 06/18/19 0400  NA 141 143  --  147* 144  --  147* 148* 147*  K 3.8 4.1  --  3.9 4.1  --  4.1 4.2 4.2  CL 106 106  --  108  --   --  110  --  111  CO2 27 26  --  28  --   --  27  --  28  GLUCOSE 166* 283*  --  171*  --   --  305*  --  379*  BUN 29* 38*  --  64*  --   --  74*  --  76*  CREATININE 0.95 1.23*  --  1.60*  --   --  1.43*  --  1.05*  CALCIUM 8.2* 8.4*  --  8.5*  --   --  8.2*  --  8.3*  MG 2.5* 2.5* 2.5* 2.5*  --  2.4 2.5*  --   --   PHOS  --   --  3.2 1.9*  --  3.1  --   --   --    GFR: Estimated Creatinine Clearance: 57.8 mL/min (A) (by C-G formula based on SCr of 1.05 mg/dL (H)). Recent Labs  Lab 06/12/19 0030 06/17/2019 0346 06/16/19 0434 06/17/19 0400 06/18/19 0400  WBC  --  13.7* 8.2 6.5 6.2  LATICACIDVEN 1.1  --   --   --   --     Liver Function Tests: Recent Labs  Lab 06/13/19 0235 06/14/19 0506 06/28/2019 0346 06/16/19 0434 06/17/19 0400  AST 42* 37 35 26 44*  ALT 15 12 15 13 16   ALKPHOS 118 111 130* 111 82  BILITOT 1.1 1.0 1.3* 1.0 1.7*  PROT 6.1* 6.1* 6.9 6.2* 5.7*  ALBUMIN 2.9* 2.8* 3.2* 2.8* 2.4*   No results for input(s): LIPASE, AMYLASE in the last 168 hours. Recent Labs  Lab 06/11/19 1930  AMMONIA 13    ABG    Component Value Date/Time   PHART 7.406 06/17/2019 1626   PCO2ART 46.3 06/17/2019 1626   PO2ART 77.0 (L) 06/17/2019 1626   HCO3 29.0 (H) 06/17/2019 1626   TCO2 30 06/17/2019 1626   ACIDBASEDEF 2.0 06/16/2019 1631   O2SAT 95.0 06/17/2019 1626     Coagulation Profile: No  results for input(s): INR, PROTIME in the last 168 hours.  Cardiac Enzymes: No results for input(s): CKTOTAL, CKMB,  CKMBINDEX, TROPONINI in the last 168 hours.  HbA1C: Hemoglobin A1C  Date/Time Value Ref Range Status  09/15/2015 12:00 AM 7.0  Final  04/04/2013 04:26 AM 6.0 4.2 - 6.3 % Final    Comment:    The American Diabetes Association recommends that a primary goal of therapy should be <7% and that physicians should reevaluate the treatment regimen in patients with HbA1c values consistently >8%.    Hgb A1c MFr Bld  Date/Time Value Ref Range Status  11/20/2018 10:52 AM 5.6 4.8 - 5.6 % Final    Comment:    (NOTE) Pre diabetes:          5.7%-6.4% Diabetes:              >6.4% Glycemic control for   <7.0% adults with diabetes   07/20/2016 04:28 AM 5.2 4.8 - 5.6 % Final    Comment:    (NOTE)         Pre-diabetes: 5.7 - 6.4         Diabetes: >6.4         Glycemic control for adults with diabetes: <7.0     CBG: Recent Labs  Lab 06/17/19 1929 06/18/19 0053 06/18/19 0339 06/18/19 0758 06/18/19 1120  GLUCAP 314* 344* 364* 347* 318*    Critical care time: 95 minutes    Noe Gens, MSN, NP-C [facility] 06/18/2019, 3:15 PM   Please see [email or web address] for pager details.

## 2019-06-18 NOTE — Progress Notes (Signed)
Pharmacy Antibiotic Note  Stephanie [name] is a 69 y.o. female admitted on 06/27/2019 with sepsis.  Pharmacy has been consulted for Cefazolin dosing for MSSA pneumonia.   Plan: - D/C cefepime and vancomycin - Cefazolin 2g IV q8h -Monitor CBC, renal fx, cultures and clinical progress     Weight: 220 lb 7.4 oz (100 kg)(bed scale not working)  Temp (24hrs), Avg:99.5 F (37.5 C), Min:98.6 F (37 C), Max:100.4 F (38 C)  Recent Labs  Lab 06/11/19 1236 06/12/19 0030 06/14/19 0506 06/17/2019 0346 06/16/19 0434 06/17/19 0400 06/18/19 0400  WBC 2.1*  --  4.2 13.7* 8.2 6.5 6.2  CREATININE 1.08*  --  0.95 1.23* 1.60* 1.43* 1.05*  LATICACIDVEN 1.4 1.1  --   --   --   --   --     Estimated Creatinine Clearance: 57.8 mL/min (A) (by C-G formula based on SCr of 1.05 mg/dL (H)).    Allergies  Allergen Reactions  . Cortisone     Increased BS  . Doxycycline Hives, Swelling, Other (See Comments) and Nausea Only    Reaction:  Facial swelling  Face red and swollen; no difficulty breathing  . Morphine Other (See Comments)    Antimicrobials this admission: Remdesivir 1/5 >> 1/9 Cefepime 1/10 >> 1/12 Vanc 1/10 >> 1/12 Cefazolin 1/12 >   Dose adjustments this admission: None   Microbiology results: 1/5 BCx: NegF 1/6 MRSA PCR: neg 1/10 BCx: ngtd 1/10 TA: abundant Staph aureus (MSSA)  Thank you for allowing pharmacy to be a part of this patient's care.  Gretta Arab PharmD, BCPS Clinical pharmacist phone 7am- 5pm: [phone] 06/18/2019 8:41 AM

## 2019-06-19 DIAGNOSIS — E1165 Type 2 diabetes mellitus with hyperglycemia: Secondary | ICD-10-CM

## 2019-06-19 DIAGNOSIS — E870 Hyperosmolality and hypernatremia: Secondary | ICD-10-CM

## 2019-06-19 LAB — BASIC METABOLIC PANEL
Anion gap: 6 (ref 5–15)
BUN: 64 mg/dL — ABNORMAL HIGH (ref 8–23)
CO2: 31 mmol/L (ref 22–32)
Calcium: 8.4 mg/dL — ABNORMAL LOW (ref 8.9–10.3)
Chloride: 113 mmol/L — ABNORMAL HIGH (ref 98–111)
Creatinine, Ser: 1.04 mg/dL — ABNORMAL HIGH (ref 0.44–1.00)
GFR calc Af Amer: >60 mL/min (ref >60)
GFR calc non Af Amer: 55 mL/min — ABNORMAL LOW (ref >60)
Glucose, Bld: 307 mg/dL — ABNORMAL HIGH (ref 70–99)
Potassium: 4.5 mmol/L (ref 3.5–5.1)
Sodium: 150 mmol/L — ABNORMAL HIGH (ref 135–145)

## 2019-06-19 LAB — POCT I-STAT 7, (LYTES, BLD GAS, ICA,H+H)
Acid-Base Excess: 7 mmol/L — ABNORMAL HIGH (ref 0.0–2.0)
Acid-Base Excess: 7 mmol/L — ABNORMAL HIGH (ref 0.0–2.0)
Bicarbonate: 35.5 mmol/L — ABNORMAL HIGH (ref 20.0–28.0)
Bicarbonate: 32.3 mmol/L — ABNORMAL HIGH (ref 20.0–28.0)
Calcium, Ion: 1.33 mmol/L (ref 1.15–1.40)
Calcium, Ion: 1.28 mmol/L (ref 1.15–1.40)
HCT: 40 % (ref 36.0–46.0)
HCT: 27 % — ABNORMAL LOW (ref 36.0–46.0)
Hemoglobin: 13.6 g/dL (ref 12.0–15.0)
Hemoglobin: 9.2 g/dL — ABNORMAL LOW (ref 12.0–15.0)
O2 Saturation: 91 %
O2 Saturation: 91 %
Patient temperature: 98.9
Patient temperature: 37.5
Potassium: 4.6 mmol/L (ref 3.5–5.1)
Potassium: 4.5 mmol/L (ref 3.5–5.1)
Sodium: 149 mmol/L — ABNORMAL HIGH (ref 135–145)
Sodium: 153 mmol/L — ABNORMAL HIGH (ref 135–145)
TCO2: 38 mmol/L — ABNORMAL HIGH (ref 22–32)
TCO2: 34 mmol/L — ABNORMAL HIGH (ref 22–32)
pCO2 arterial: 69.3 mmHg (ref 32.0–48.0)
pCO2 arterial: 51.7 mmHg — ABNORMAL HIGH (ref 32.0–48.0)
pH, Arterial: 7.318 — ABNORMAL LOW (ref 7.350–7.450)
pH, Arterial: 7.406 (ref 7.350–7.450)
pO2, Arterial: 68 mmHg — ABNORMAL LOW (ref 83.0–108.0)
pO2, Arterial: 63 mmHg — ABNORMAL LOW (ref 83.0–108.0)

## 2019-06-19 LAB — CBC
HCT: 33 % — ABNORMAL LOW (ref 36.0–46.0)
Hemoglobin: 10 g/dL — ABNORMAL LOW (ref 12.0–15.0)
MCH: 30.2 pg (ref 26.0–34.0)
MCHC: 30.3 g/dL (ref 30.0–36.0)
MCV: 99.7 fL (ref 80.0–100.0)
Platelets: 143 10*3/uL — ABNORMAL LOW (ref 150–400)
RBC: 3.31 MIL/uL — ABNORMAL LOW (ref 3.87–5.11)
RDW: 15.5 % (ref 11.5–15.5)
WBC: 10.1 10*3/uL (ref 4.0–10.5)
nRBC: 0.6 % — ABNORMAL HIGH (ref 0.0–0.2)

## 2019-06-19 LAB — GLUCOSE, CAPILLARY
Glucose-Capillary: 172 mg/dL — ABNORMAL HIGH (ref 70–99)
Glucose-Capillary: 198 mg/dL — ABNORMAL HIGH (ref 70–99)
Glucose-Capillary: 235 mg/dL — ABNORMAL HIGH (ref 70–99)
Glucose-Capillary: 276 mg/dL — ABNORMAL HIGH (ref 70–99)
Glucose-Capillary: 322 mg/dL — ABNORMAL HIGH (ref 70–99)
Glucose-Capillary: 322 mg/dL — ABNORMAL HIGH (ref 70–99)

## 2019-06-19 MED ORDER — FUROSEMIDE 10 MG/ML IJ SOLN
40.0000 mg | Freq: Once | INTRAMUSCULAR | Status: AC
  Administered 2019-06-19: 19:00:00 40 mg via INTRAVENOUS
  Filled 2019-06-19: qty 4

## 2019-06-19 MED ORDER — OXYCODONE HCL 5 MG PO TABS
5.0000 mg | ORAL_TABLET | Freq: Four times a day (QID) | ORAL | Status: DC
  Administered 2019-06-19 – 2019-06-24 (×18): 5 mg
  Filled 2019-06-19 (×18): qty 1

## 2019-06-19 MED ORDER — INSULIN ASPART 100 UNIT/ML ~~LOC~~ SOLN
6.0000 [IU] | SUBCUTANEOUS | Status: DC
  Administered 2019-06-19 – 2019-06-20 (×6): 6 [IU] via SUBCUTANEOUS

## 2019-06-19 MED ORDER — CLONAZEPAM 1 MG PO TABS
1.0000 mg | ORAL_TABLET | Freq: Two times a day (BID) | ORAL | Status: DC
  Administered 2019-06-19 – 2019-06-22 (×7): 1 mg
  Filled 2019-06-19 (×7): qty 1

## 2019-06-19 MED ORDER — FREE WATER
200.0000 mL | Status: DC
  Administered 2019-06-19 – 2019-06-24 (×32): 200 mL

## 2019-06-19 MED ORDER — INSULIN DETEMIR 100 UNIT/ML ~~LOC~~ SOLN
24.0000 [IU] | Freq: Two times a day (BID) | SUBCUTANEOUS | Status: DC
  Administered 2019-06-19 (×2): 24 [IU] via SUBCUTANEOUS
  Filled 2019-06-19 (×3): qty 0.24

## 2019-06-19 NOTE — Progress Notes (Signed)
Pt supined at this time and ETT retaped at 24CM at lip.  No complications to report.  ABG will follow in 1 hour.

## 2019-06-19 NOTE — Progress Notes (Signed)
NAME:  Stephanie [name], MRN:  [id number], DOB:  [birth year], LOS: 4 ADMISSION DATE:  06/14/2019, CONSULTATION DATE:  06/28/2019 REFERRING MD:  Mortimer Fries, CHIEF COMPLAINT:  ARDS/resp failure   Brief History   69 y/o [name] to [facility] after testing positive for COVID on 1/2 at her nursing facility.  She was found to have hypoxic respiratory failure with saturations in the 70's on presentation. She was treated with remdesivir & steroids but had worsening respiratory failure requiring intubation on 1/8 and placement of central line. She was transferred to [facility] on 1/9 for care.    Past Medical History  HTN HLD DM  COPD / Asthma  CVA GERD Bipolar Depression  IDA  Hepatitis B  Cirrhosis  Rheumatic Fever  HLD OSA RLS   Significant Hospital Events   1/09 Tx to [place], COVID +, intubated  1/10 Fevers not responsive to tylenol, requiring more pressor support.  1/11 Prone, Peak 30/Pplat 29   Consults:  PCCM  Procedures:  ETT 1/9 >>  L IJ TLC 1/9 >>   Significant Diagnostic Tests:    Micro Data:  BCx2 1/10 >>  Tracheal aspirate 1/10 >> abundant MSSA  Antimicrobials:  Vanco 1/10 >> 1/12 Cefepime 1/10 >> 1/12 Cefazolin 1/12 >>  Interim history/subjective:  Tmax 99 Glucose range - 171-360 I/O - UOP 2.7L, net neg 1.1L in last 24 hours  PEEP 14, FiO2 70%, Peak 27, Pplat 26, P/F 97 Remains off vasopressors  Objective   Blood pressure (!) 116/50, pulse 81, temperature 99 F (37.2 C), resp. rate (!) 21, weight 100 kg, SpO2 (!) 89 %.    Vent Mode: PRVC FiO2 (%):  [70 %-80 %] 70 % Set Rate:  [25 bmp-30 bmp] 30 bmp Vt Set:  [400 mL] 400 mL PEEP:  [14 cmH20] 14 cmH20 Plateau Pressure:  [23 cmH20-25 cmH20] 25 cmH20   Intake/Output Summary (Last 24 hours) at 06/19/2019 3267 Last data filed at 06/19/2019 0600 Gross per 24 hour  Intake 1614.57 ml  Output 2660 ml  Net -1045.43 ml   Filed Weights   06/16/19 0448  Weight: 100 kg    Examination: General: Adult female lying in  bed on mechanical ventilation in no acute distress HEENT: MM pink/moist, ETT, pupils equal/reactive Neuro: Sedate CV: s1s2 RRR, no m/r/g PULM: Even/nonlabored on mechanical ventilation, diminished breath sounds bilaterally GI: soft, bsx4 active  Extremities: warm/dry, trace to 1+ generalized edema  Skin: no rashes or lesions  Resolved Hospital Problem list      Assessment & Plan:   Acute Hypoxic and Hypercapnic Respiratory Failure from COVID 19 PNA with MSSA Completed remdesivir.  -continue cefazolin, D4/7, stop date in place -low Vt ventilation 4-8cc/kg -goal plateau pressure <30, driving pressure <12 cm H2O -target PaO2 55-65, titrate PEEP/FiO2 per ARDS protocol  -if P/F ratio <150, consider prone therapy for 16 hours per day -goal CVP <4, diuresis as necessary -VAP prevention measures  -follow intermittent CXR  -continue decadron  Septic Shock  Suspect element of sedation / hypovolemia on admit. Off pressors 1/12 -follow cultures -suspect some element of sedation  AKI  Hypernatremia  -increase free water  -Trend BMP / urinary output -Replace electrolytes as indicated -Avoid nephrotoxic agents, ensure adequate renal perfusion  Anemia  Mild Thrombocytopenia  -trend CBC -transfuse for Hgb <7%  DM with Steroid Induced Hyperglycemia  -moderate SSI  -increase TF coverage -increase levemir to 24 units BID    Best practice:  Diet: TF Pain/Anxiety/Delirium protocol (if indicated): in  place VAP protocol (if indicated): yes DVT prophylaxis: lovenox GI prophylaxis: PPI, HOB elevated Glucose control: SSI Mobility: bed Code Status: full Family Communication: Attempted to call [name] 1/12 with no response.  She was named on advanced directive in 2018 (confirmed documented in [facility] system).  In the ER at [facility] the patient asked that Driscilla Grammes be updated.  Both are listed as contacts.   Disposition: ICU  Labs   CBC: Recent Labs  Lab 06/14/19 0506 06/28/2019  0346 06/16/19 0434 06/17/19 0400 06/17/19 1626 06/18/19 0400 06/19/19 0457 06/19/19 0557  WBC 4.2 13.7* 8.2 6.5  --  6.2  --  10.1  NEUTROABS 3.5 11.1* 7.2 5.3  --  4.9  --   --   HGB 10.7* 12.0 11.0* 10.0* 8.8* 9.4* 13.6 10.0*  HCT 33.3* 37.6 35.5* 32.6* 26.0* 29.9* 40.0 33.0*  MCV 92.8 94.7 96.5 98.2  --  98.0  --  99.7  PLT 126* 219 146* 149*  --  132*  --  143*    Basic Metabolic Panel: Recent Labs  Lab 06/20/2019 0346 06/21/2019 1715 06/16/19 0434 06/16/19 1843 06/17/19 0400 06/17/19 1626 06/18/19 0400 06/19/19 0457 06/19/19 0557  NA 143  --  147*  --  147* 148* 147* 149* 150*  K 4.1  --  3.9  --  4.1 4.2 4.2 4.6 4.5  CL 106  --  108  --  110  --  111  --  113*  CO2 26  --  28  --  27  --  28  --  31  GLUCOSE 283*  --  171*  --  305*  --  379*  --  307*  BUN 38*  --  64*  --  74*  --  76*  --  64*  CREATININE 1.23*  --  1.60*  --  1.43*  --  1.05*  --  1.04*  CALCIUM 8.4*  --  8.5*  --  8.2*  --  8.3*  --  8.4*  MG 2.5* 2.5* 2.5* 2.4 2.5*  --   --   --   --   PHOS  --  3.2 1.9* 3.1  --   --   --   --   --    GFR: Estimated Creatinine Clearance: 58.4 mL/min (A) (by C-G formula based on SCr of 1.04 mg/dL (H)). Recent Labs  Lab 06/16/19 0434 06/17/19 0400 06/18/19 0400 06/19/19 0557  WBC 8.2 6.5 6.2 10.1    Liver Function Tests: Recent Labs  Lab 06/13/19 0235 06/14/19 0506 07/07/2019 0346 06/16/19 0434 06/17/19 0400  AST 42* 37 35 26 44*  ALT 15 12 15 13 16   ALKPHOS 118 111 130* 111 82  BILITOT 1.1 1.0 1.3* 1.0 1.7*  PROT 6.1* 6.1* 6.9 6.2* 5.7*  ALBUMIN 2.9* 2.8* 3.2* 2.8* 2.4*   No results for input(s): LIPASE, AMYLASE in the last 168 hours. No results for input(s): AMMONIA in the last 168 hours.  ABG    Component Value Date/Time   PHART 7.318 (L) 06/19/2019 0457   PCO2ART 69.3 (HH) 06/19/2019 0457   PO2ART 68.0 (L) 06/19/2019 0457   HCO3 35.5 (H) 06/19/2019 0457   TCO2 38 (H) 06/19/2019 0457   ACIDBASEDEF 2.0 06/16/2019 1631   O2SAT 91.0  06/19/2019 0457     Coagulation Profile: No results for input(s): INR, PROTIME in the last 168 hours.  Cardiac Enzymes: No results for input(s): CKTOTAL, CKMB, CKMBINDEX, TROPONINI in the last 168 hours.  HbA1C: Hemoglobin A1C  Date/Time Value Ref Range Status  09/15/2015 12:00 AM 7.0  Final  04/04/2013 04:26 AM 6.0 4.2 - 6.3 % Final    Comment:    The American Diabetes Association recommends that a primary goal of therapy should be <7% and that physicians should reevaluate the treatment regimen in patients with HbA1c values consistently >8%.    Hgb A1c MFr Bld  Date/Time Value Ref Range Status  11/20/2018 10:52 AM 5.6 4.8 - 5.6 % Final    Comment:    (NOTE) Pre diabetes:          5.7%-6.4% Diabetes:              >6.4% Glycemic control for   <7.0% adults with diabetes   07/20/2016 04:28 AM 5.2 4.8 - 5.6 % Final    Comment:    (NOTE)         Pre-diabetes: 5.7 - 6.4         Diabetes: >6.4         Glycemic control for adults with diabetes: <7.0     CBG: Recent Labs  Lab 06/18/19 1525 06/18/19 2001 06/19/19 0003 06/19/19 0309 06/19/19 0742  GLUCAP 360* 352* 322* 322* 235*    Critical care time:  4 minutes    Noe Gens, MSN, NP-C [facility] 06/19/2019, 8:39 AM   Please see [email or web address] for pager details.

## 2019-06-19 NOTE — Progress Notes (Signed)
RT NOTE: Patient head turned to the LEFT side without any complications. Tube still secured at 23.

## 2019-06-19 NOTE — Progress Notes (Signed)
Family called and this RN updated at this time. Appreciated update.

## 2019-06-19 NOTE — Progress Notes (Signed)
Pt proned at this time with no complications. ETT secured in proper position with cloth tape in  Center of mouth. VS within normal limits

## 2019-06-19 NOTE — Procedures (Signed)
Cortrak  Person Inserting Tube:  Maylon Peppers C, RD Tube Type:  Cortrak - 43 inches Tube Location:  Left nare Initial Placement:  Postpyloric Secured by: Bridle Technique Used to Measure Tube Placement:  Documented cm marking at nare/ corner of mouth Cortrak Secured At:  88 cm    Cortrak Tube Team Note:  Consult received to place a Cortrak feeding tube.   No x-ray is required. RN may begin using tube.   If the tube becomes dislodged please keep the tube and contact the Cortrak team at [email or web address] ([email or web address]) for replacement.  If after hours and replacement cannot be delayed, place a NG tube and confirm placement with an abdominal x-ray.    [place], [place], [place] Pager [phone] After Hours Pager

## 2019-06-19 NOTE — Progress Notes (Signed)
Head repositioned. Arms rotated.  ETT remains secured. Patient tolerated well.

## 2019-06-20 LAB — GLUCOSE, CAPILLARY
Glucose-Capillary: 208 mg/dL — ABNORMAL HIGH (ref 70–99)
Glucose-Capillary: 213 mg/dL — ABNORMAL HIGH (ref 70–99)
Glucose-Capillary: 215 mg/dL — ABNORMAL HIGH (ref 70–99)
Glucose-Capillary: 236 mg/dL — ABNORMAL HIGH (ref 70–99)
Glucose-Capillary: 237 mg/dL — ABNORMAL HIGH (ref 70–99)
Glucose-Capillary: 295 mg/dL — ABNORMAL HIGH (ref 70–99)

## 2019-06-20 LAB — BASIC METABOLIC PANEL
Anion gap: 8 (ref 5–15)
BUN: 56 mg/dL — ABNORMAL HIGH (ref 8–23)
CO2: 32 mmol/L (ref 22–32)
Calcium: 8.2 mg/dL — ABNORMAL LOW (ref 8.9–10.3)
Chloride: 109 mmol/L (ref 98–111)
Creatinine, Ser: 0.83 mg/dL (ref 0.44–1.00)
GFR calc Af Amer: >60 mL/min (ref >60)
GFR calc non Af Amer: >60 mL/min (ref >60)
Glucose, Bld: 238 mg/dL — ABNORMAL HIGH (ref 70–99)
Potassium: 4.3 mmol/L (ref 3.5–5.1)
Sodium: 149 mmol/L — ABNORMAL HIGH (ref 135–145)

## 2019-06-20 LAB — POCT I-STAT 7, (LYTES, BLD GAS, ICA,H+H)
Acid-Base Excess: 9 mmol/L — ABNORMAL HIGH (ref 0.0–2.0)
Bicarbonate: 35.1 mmol/L — ABNORMAL HIGH (ref 20.0–28.0)
Calcium, Ion: 1.22 mmol/L (ref 1.15–1.40)
HCT: 27 % — ABNORMAL LOW (ref 36.0–46.0)
Hemoglobin: 9.2 g/dL — ABNORMAL LOW (ref 12.0–15.0)
O2 Saturation: 93 %
Patient temperature: 100.2
Potassium: 4.5 mmol/L (ref 3.5–5.1)
Sodium: 151 mmol/L — ABNORMAL HIGH (ref 135–145)
TCO2: 37 mmol/L — ABNORMAL HIGH (ref 22–32)
pCO2 arterial: 61.6 mmHg — ABNORMAL HIGH (ref 32.0–48.0)
pH, Arterial: 7.368 (ref 7.350–7.450)
pO2, Arterial: 75 mmHg — ABNORMAL LOW (ref 83.0–108.0)

## 2019-06-20 LAB — CBC
HCT: 35.9 % — ABNORMAL LOW (ref 36.0–46.0)
Hemoglobin: 10.8 g/dL — ABNORMAL LOW (ref 12.0–15.0)
MCH: 29.9 pg (ref 26.0–34.0)
MCHC: 30.1 g/dL (ref 30.0–36.0)
MCV: 99.4 fL (ref 80.0–100.0)
Platelets: 145 10*3/uL — ABNORMAL LOW (ref 150–400)
RBC: 3.61 MIL/uL — ABNORMAL LOW (ref 3.87–5.11)
RDW: 15.4 % (ref 11.5–15.5)
WBC: 14.7 10*3/uL — ABNORMAL HIGH (ref 4.0–10.5)
nRBC: 0.3 % — ABNORMAL HIGH (ref 0.0–0.2)

## 2019-06-20 MED ORDER — FUROSEMIDE 10 MG/ML IJ SOLN
40.0000 mg | Freq: Four times a day (QID) | INTRAMUSCULAR | Status: AC
  Administered 2019-06-20 (×2): 40 mg via INTRAVENOUS
  Filled 2019-06-20 (×2): qty 4

## 2019-06-20 MED ORDER — INSULIN ASPART 100 UNIT/ML ~~LOC~~ SOLN
8.0000 [IU] | SUBCUTANEOUS | Status: DC
  Administered 2019-06-20 – 2019-06-22 (×13): 8 [IU] via SUBCUTANEOUS

## 2019-06-20 MED ORDER — INSULIN DETEMIR 100 UNIT/ML ~~LOC~~ SOLN
26.0000 [IU] | Freq: Two times a day (BID) | SUBCUTANEOUS | Status: DC
  Administered 2019-06-20 (×2): 26 [IU] via SUBCUTANEOUS
  Filled 2019-06-20 (×3): qty 0.26

## 2019-06-20 NOTE — Progress Notes (Signed)
Patient placed in supine position by RT x 2 and RN x 3.  Due to desat, FIO2 increased to 100%.

## 2019-06-20 NOTE — Progress Notes (Signed)
RT NOTE: Patient head turned to the LEFT side without any complications. Tube still secured at23.

## 2019-06-20 NOTE — Progress Notes (Signed)
NAME:  Stephanie [name], MRN:  [id number], DOB:  Aug 29, 1950, LOS: 5 ADMISSION DATE:  06/28/2019, CONSULTATION DATE:  06/07/2019 REFERRING MD:  Mortimer Fries, CHIEF COMPLAINT:  ARDS/resp failure   Brief History   69 y/o [name] to [facility] after testing positive for COVID on 1/2 at her nursing facility.  She was found to have hypoxic respiratory failure with saturations in the 70's on presentation. She was treated with remdesivir & steroids but had worsening respiratory failure requiring intubation on 1/8 and placement of central line. She was transferred to [facility] on 1/9 for care.    Past Medical History  HTN HLD DM  COPD / Asthma  CVA GERD Bipolar Depression  IDA  Hepatitis B  Cirrhosis  Rheumatic Fever  HLD OSA RLS   Significant Hospital Events   1/09 Tx to [place], COVID +, intubated  1/10 Fevers not responsive to tylenol, requiring more pressor support.  1/11 Prone, Peak 30/Pplat 29  1/13 prone, Peak 27/Pplat 26, Pf 97  Consults:  PCCM  Procedures:  ETT 1/9 >>  L IJ TLC 1/9 >>   Significant Diagnostic Tests:    Micro Data:  BCx2 1/10 >>  Tracheal aspirate 1/10 >> abundant MSSA  Antimicrobials:  Vanco 1/10 >> 1/12 Cefepime 1/10 >> 1/12 Cefazolin 1/12 >>  Interim history/subjective:  Tmax 98.5 Glucose range - 200-380 I/O - UOP 2.7L, +456 in 24h  PEEP 14, FiO2 60%, Peak 25, Pplat 24 Noted warm red left posterior shoulder   Objective   Blood pressure (!) 134/54, pulse 96, temperature 98.5 F (36.9 C), temperature source Axillary, resp. rate (!) 28, weight 100 kg, SpO2 92 %.    Vent Mode: PRVC FiO2 (%):  [60 %] 60 % Set Rate:  [30 bmp] 30 bmp Vt Set:  [400 mL] 400 mL PEEP:  [14 cmH20] 14 cmH20 Plateau Pressure:  [28 cmH20] 28 cmH20   Intake/Output Summary (Last 24 hours) at 06/20/2019 0836 Last data filed at 06/20/2019 0600 Gross per 24 hour  Intake 2927.95 ml  Output 2630 ml  Net 297.95 ml   Filed Weights   06/16/19 0448  Weight: 100 kg    Examination:  General: adult female lying in bed prone, critically ill appearing in NAD  HEENT: MM pink/moist, ETT, scant bloody secretions from mouth, none in ETT Neuro: sedate CV: s1s2 RRR, no m/r/g PULM:  Synchronous, non-labored with equal breath sounds bilaterally  GI: soft, bsx4 active  Extremities: warm/dry, trace edema  Skin: no rashes or lesions  Resolved Hospital Problem list   Septic Shock - off pressors 1/12  Assessment & Plan:   Acute Hypoxic and Hypercapnic Respiratory Failure from COVID 19 PNA with MSSA PNA Completed remdesivir.  -continue cefazolin D5/7 -low Vt ventilation 4-8cc/kg -goal plateau pressure <30, driving pressure <84 cm H2O -target PaO2 55-65, titrate PEEP/FiO2 per ARDS protocol  -P/F ratio <150, repeat prone therapy for 16 hours per day -goal CVP <4, diuresis as necessary -VAP prevention measures  -follow intermittent CXR  -continue decadron   AKI  Hypernatremia  -continue free water  -Trend BMP / urinary output -Replace electrolytes as indicated -Avoid nephrotoxic agents, ensure adequate renal perfusion  Anemia  Mild Thrombocytopenia  -trend CBC -transfuse for Hgb <7%  DM with Steroid Induced Hyperglycemia  -SSI with resistant scale  -increase TF coverage to 8 units  -increase levemir to 26 units BID   Best practice:  Diet: TF Pain/Anxiety/Delirium protocol (if indicated): in place VAP protocol (if indicated): yes DVT prophylaxis:  lovenox GI prophylaxis: PPI, HOB elevated Glucose control: SSI Mobility: bed Code Status: full Family Communication: 1/14 Message left for sister Webb Silversmith. Attempted to call [name] 1/12 with no response.  She was named on advanced directive in 2018 (confirmed documented in [facility] system).  In the ER at [facility] the patient asked that Driscilla Grammes be updated.  Both [place] are listed as contacts but family insist that they not be updated.   Disposition: ICU  Labs   CBC: Recent Labs  Lab 06/14/19 0506 06/14/19  0506 07/06/2019 0346 06/16/2019 1026 06/16/19 0434 06/16/19 1631 06/17/19 0400 06/17/19 1626 06/18/19 0400 06/19/19 0457 06/19/19 0557 06/19/19 1044 06/20/19 0448  WBC 4.2   < > 13.7*  --  8.2  --  6.5  --  6.2  --  10.1  --  14.7*  NEUTROABS 3.5  --  11.1*  --  7.2  --  5.3  --  4.9  --   --   --   --   HGB 10.7*   < > 12.0   < > 11.0*   < > 10.0*   < > 9.4* 13.6 10.0* 9.2* 10.8*  HCT 33.3*   < > 37.6   < > 35.5*   < > 32.6*   < > 29.9* 40.0 33.0* 27.0* 35.9*  MCV 92.8   < > 94.7  --  96.5  --  98.2  --  98.0  --  99.7  --  99.4  PLT 126*   < > 219  --  146*  --  149*  --  132*  --  143*  --  145*   < > = values in this interval not displayed.    Basic Metabolic Panel: Recent Labs  Lab 07/04/2019 0346 06/28/2019 0346 07/01/2019 1026 06/23/2019 1715 06/16/19 0434 06/16/19 1631 06/16/19 1843 06/17/19 0400 06/17/19 1626 06/18/19 0400 06/19/19 0457 06/19/19 0557 06/19/19 1044 06/20/19 0448  NA 143   < >   < >  --  147*   < >  --  147*   < > 147* 149* 150* 153* 149*  K 4.1   < >   < >  --  3.9   < >  --  4.1   < > 4.2 4.6 4.5 4.5 4.3  CL 106   < >  --   --  108  --   --  110  --  111  --  113*  --  109  CO2 26   < >  --   --  28  --   --  27  --  28  --  31  --  32  GLUCOSE 283*   < >  --   --  171*  --   --  305*  --  379*  --  307*  --  238*  BUN 38*   < >  --   --  64*  --   --  74*  --  76*  --  64*  --  56*  CREATININE 1.23*   < >  --   --  1.60*  --   --  1.43*  --  1.05*  --  1.04*  --  0.83  CALCIUM 8.4*   < >  --   --  8.5*  --   --  8.2*  --  8.3*  --  8.4*  --  8.2*  MG 2.5*  --   --  2.5* 2.5*  --  2.4 2.5*  --   --   --   --   --   --   PHOS  --   --   --  3.2 1.9*  --  3.1  --   --   --   --   --   --   --    < > = values in this interval not displayed.   GFR: Estimated Creatinine Clearance: 73.1 mL/min (by C-G formula based on SCr of 0.83 mg/dL). Recent Labs  Lab 06/17/19 0400 06/18/19 0400 06/19/19 0557 06/20/19 0448  WBC 6.5 6.2 10.1 14.7*    Liver  Function Tests: Recent Labs  Lab 06/14/19 0506 07/07/2019 0346 06/16/19 0434 06/17/19 0400  AST 37 35 26 44*  ALT 12 15 13 16   ALKPHOS 111 130* 111 82  BILITOT 1.0 1.3* 1.0 1.7*  PROT 6.1* 6.9 6.2* 5.7*  ALBUMIN 2.8* 3.2* 2.8* 2.4*   No results for input(s): LIPASE, AMYLASE in the last 168 hours. No results for input(s): AMMONIA in the last 168 hours.  ABG    Component Value Date/Time   PHART 7.406 06/19/2019 1044   PCO2ART 51.7 (H) 06/19/2019 1044   PO2ART 63.0 (L) 06/19/2019 1044   HCO3 32.3 (H) 06/19/2019 1044   TCO2 34 (H) 06/19/2019 1044   ACIDBASEDEF 2.0 06/16/2019 1631   O2SAT 91.0 06/19/2019 1044     Coagulation Profile: No results for input(s): INR, PROTIME in the last 168 hours.  Cardiac Enzymes: No results for input(s): CKTOTAL, CKMB, CKMBINDEX, TROPONINI in the last 168 hours.  HbA1C: Hemoglobin A1C  Date/Time Value Ref Range Status  09/15/2015 12:00 AM 7.0  Final  04/04/2013 04:26 AM 6.0 4.2 - 6.3 % Final    Comment:    The American Diabetes Association recommends that a primary goal of therapy should be <7% and that physicians should reevaluate the treatment regimen in patients with HbA1c values consistently >8%.    Hgb A1c MFr Bld  Date/Time Value Ref Range Status  11/20/2018 10:52 AM 5.6 4.8 - 5.6 % Final    Comment:    (NOTE) Pre diabetes:          5.7%-6.4% Diabetes:              >6.4% Glycemic control for   <7.0% adults with diabetes   07/20/2016 04:28 AM 5.2 4.8 - 5.6 % Final    Comment:    (NOTE)         Pre-diabetes: 5.7 - 6.4         Diabetes: >6.4         Glycemic control for adults with diabetes: <7.0     CBG: Recent Labs  Lab 06/19/19 1156 06/19/19 1605 06/19/19 1957 06/19/19 2350 06/20/19 0406  GLUCAP 198* 172* 276* 295* 236*    Critical care time: 33 minutes    Noe Gens, MSN, NP-C [facility] 06/20/2019, 8:36 AM   Please see [email or web address] for pager details.

## 2019-06-20 NOTE — Progress Notes (Signed)
Update given to pt friend, Jenny Reichmann.

## 2019-06-20 NOTE — Progress Notes (Signed)
RT NOTE: Patient head turned to the right side without any complications. Tube still secured at 23.

## 2019-06-20 NOTE — Progress Notes (Signed)
Pt placed prone at 2140, pt tolerated procedure well

## 2019-06-21 ENCOUNTER — Encounter (HOSPITAL_COMMUNITY): Payer: Self-pay | Admitting: Anesthesiology

## 2019-06-21 ENCOUNTER — Inpatient Hospital Stay (HOSPITAL_COMMUNITY): Payer: Medicare Other

## 2019-06-21 ENCOUNTER — Other Ambulatory Visit: Payer: Self-pay

## 2019-06-21 LAB — CBC
HCT: 32.1 % — ABNORMAL LOW (ref 36.0–46.0)
Hemoglobin: 9.4 g/dL — ABNORMAL LOW (ref 12.0–15.0)
MCH: 29.7 pg (ref 26.0–34.0)
MCHC: 29.3 g/dL — ABNORMAL LOW (ref 30.0–36.0)
MCV: 101.6 fL — ABNORMAL HIGH (ref 80.0–100.0)
Platelets: 125 10*3/uL — ABNORMAL LOW (ref 150–400)
RBC: 3.16 MIL/uL — ABNORMAL LOW (ref 3.87–5.11)
RDW: 15.7 % — ABNORMAL HIGH (ref 11.5–15.5)
WBC: 12.1 10*3/uL — ABNORMAL HIGH (ref 4.0–10.5)
nRBC: 0.2 % (ref 0.0–0.2)

## 2019-06-21 LAB — POCT I-STAT 7, (LYTES, BLD GAS, ICA,H+H)
Acid-Base Excess: 9 mmol/L — ABNORMAL HIGH (ref 0.0–2.0)
Bicarbonate: 35.2 mmol/L — ABNORMAL HIGH (ref 20.0–28.0)
Calcium, Ion: 1.18 mmol/L (ref 1.15–1.40)
HCT: 29 % — ABNORMAL LOW (ref 36.0–46.0)
Hemoglobin: 9.9 g/dL — ABNORMAL LOW (ref 12.0–15.0)
O2 Saturation: 94 %
Potassium: 4.9 mmol/L (ref 3.5–5.1)
Sodium: 148 mmol/L — ABNORMAL HIGH (ref 135–145)
TCO2: 37 mmol/L — ABNORMAL HIGH (ref 22–32)
pCO2 arterial: 59.3 mmHg — ABNORMAL HIGH (ref 32.0–48.0)
pH, Arterial: 7.381 (ref 7.350–7.450)
pO2, Arterial: 75 mmHg — ABNORMAL LOW (ref 83.0–108.0)

## 2019-06-21 LAB — BASIC METABOLIC PANEL
Anion gap: 8 (ref 5–15)
BUN: 56 mg/dL — ABNORMAL HIGH (ref 8–23)
CO2: 34 mmol/L — ABNORMAL HIGH (ref 22–32)
Calcium: 7.7 mg/dL — ABNORMAL LOW (ref 8.9–10.3)
Chloride: 107 mmol/L (ref 98–111)
Creatinine, Ser: 0.82 mg/dL (ref 0.44–1.00)
GFR calc Af Amer: >60 mL/min (ref >60)
GFR calc non Af Amer: >60 mL/min (ref >60)
Glucose, Bld: 248 mg/dL — ABNORMAL HIGH (ref 70–99)
Potassium: 4.1 mmol/L (ref 3.5–5.1)
Sodium: 149 mmol/L — ABNORMAL HIGH (ref 135–145)

## 2019-06-21 LAB — GLUCOSE, CAPILLARY
Glucose-Capillary: 196 mg/dL — ABNORMAL HIGH (ref 70–99)
Glucose-Capillary: 237 mg/dL — ABNORMAL HIGH (ref 70–99)
Glucose-Capillary: 247 mg/dL — ABNORMAL HIGH (ref 70–99)
Glucose-Capillary: 257 mg/dL — ABNORMAL HIGH (ref 70–99)
Glucose-Capillary: 296 mg/dL — ABNORMAL HIGH (ref 70–99)
Glucose-Capillary: 304 mg/dL — ABNORMAL HIGH (ref 70–99)
Glucose-Capillary: 316 mg/dL — ABNORMAL HIGH (ref 70–99)

## 2019-06-21 MED ORDER — SODIUM CHLORIDE 0.9 % IV SOLN
0.5000 mg/h | INTRAVENOUS | Status: DC
  Administered 2019-06-21: 12:00:00 0.5 mg/h via INTRAVENOUS
  Administered 2019-06-23: 14:00:00 3 mg/h via INTRAVENOUS
  Administered 2019-06-24 (×2): 4 mg/h via INTRAVENOUS
  Filled 2019-06-21 (×5): qty 5

## 2019-06-21 MED ORDER — HYDROMORPHONE BOLUS VIA INFUSION
0.2000 mg | INTRAVENOUS | Status: DC | PRN
  Administered 2019-06-21 (×2): 0.2 mg via INTRAVENOUS
  Filled 2019-06-21: qty 1

## 2019-06-21 MED ORDER — INSULIN DETEMIR 100 UNIT/ML ~~LOC~~ SOLN
30.0000 [IU] | Freq: Two times a day (BID) | SUBCUTANEOUS | Status: DC
  Administered 2019-06-21 – 2019-06-22 (×3): 30 [IU] via SUBCUTANEOUS
  Filled 2019-06-21 (×4): qty 0.3

## 2019-06-21 MED ORDER — FUROSEMIDE 10 MG/ML IJ SOLN
40.0000 mg | Freq: Four times a day (QID) | INTRAMUSCULAR | Status: AC
  Administered 2019-06-21 (×2): 40 mg via INTRAVENOUS
  Filled 2019-06-21 (×2): qty 4

## 2019-06-21 MED ORDER — POTASSIUM CHLORIDE 20 MEQ/15ML (10%) PO SOLN
40.0000 meq | Freq: Once | ORAL | Status: AC
  Administered 2019-06-21: 10:00:00 40 meq
  Filled 2019-06-21: qty 30

## 2019-06-21 MED ORDER — ARTIFICIAL TEARS OPHTHALMIC OINT
TOPICAL_OINTMENT | Freq: Three times a day (TID) | OPHTHALMIC | Status: DC
  Administered 2019-06-22: 1 via OPHTHALMIC
  Filled 2019-06-21 (×3): qty 3.5

## 2019-06-21 NOTE — Progress Notes (Signed)
Pt. Head turned. No issues.

## 2019-06-21 NOTE — Progress Notes (Signed)
Pharmacy Antibiotic Note  Stephanie [name] is a 69 y.o. female admitted on 06/12/2019 with sepsis.  Pharmacy has been consulted for Cefazolin dosing for MSSA pneumonia.   Plan: - Cefazolin 2g IV q8h - continue for total of 7 day course, stop date 1/16 -Monitor CBC, renal fx, cultures and clinical progress    Height: 5' 3"  (160 cm) Weight: 220 lb 7.4 oz (100 kg) IBW/kg (Calculated) : 52.4  Temp (24hrs), Avg:99.2 F (37.3 C), Min:98.7 F (37.1 C), Max:100.2 F (37.9 C)  Recent Labs  Lab 06/17/19 0400 06/18/19 0400 06/19/19 0557 06/20/19 0448 06/21/19 0450  WBC 6.5 6.2 10.1 14.7* 12.1*  CREATININE 1.43* 1.05* 1.04* 0.83 0.82    Estimated Creatinine Clearance: 74 mL/min (by C-G formula based on SCr of 0.82 mg/dL).    Allergies  Allergen Reactions  . Cortisone     Increased BS  . Doxycycline Hives, Swelling, Other (See Comments) and Nausea Only    Reaction:  Facial swelling  Face red and swollen; no difficulty breathing  . Morphine Other (See Comments)    Antimicrobials this admission: Remdesivir 1/5 >> 1/9 Cefepime 1/10 >> 1/12 Vanc 1/10 >> 1/12 Cefazolin 1/12 > (1/16)  Dose adjustments this admission: None   Microbiology results: 1/5 BCx: NegF 1/6 MRSA PCR: neg 1/10 BCx: ngtd 1/10 TA: abundant Staph aureus (MSSA)  Thank you for allowing pharmacy to be a part of this patient's care.  Gretta Arab PharmD, BCPS Clinical pharmacist phone 7am- 5pm: [phone] 06/21/2019 11:27 AM

## 2019-06-21 NOTE — Progress Notes (Addendum)
NAME:  Stephanie [name], MRN:  [id number], DOB:  May 13, 1951, LOS: 6 ADMISSION DATE:  06/29/2019, CONSULTATION DATE:  06/29/2019 REFERRING MD:  Mortimer Fries, CHIEF COMPLAINT:  ARDS/resp failure   Brief History   69 y/o [name] to [facility] after testing positive for COVID on 1/2 at her nursing facility.  She was found to have hypoxic respiratory failure with saturations in the 70's on presentation. She was treated with remdesivir & steroids but had worsening respiratory failure requiring intubation on 1/8 and placement of central line. She was transferred to [facility] on 1/9 for care.   Past Medical History  HTN HLD DM  COPD / Asthma  CVA GERD Bipolar Depression  IDA  Hepatitis B  Cirrhosis  Rheumatic Fever  HLD OSA RLS   Significant Hospital Events   1/09 Tx to [place], COVID +, intubated  1/10 Fevers not responsive to tylenol, requiring more pressor support.  1/11 Prone, Peak 30/Pplat 29  1/13 prone, Peak 27/Pplat 26, Pf 97  Consults:  PCCM  Procedures:  ETT 1/9 >>  L IJ TLC 1/9 >>   Significant Diagnostic Tests:    Micro Data:  BCx2 1/10 >>  Tracheal aspirate 1/10 >> abundant MSSA  Antimicrobials:  Vanco 1/10 >> 1/12 Cefepime 1/10 >> 1/12 Cefazolin 1/12 >>   Interim history/subjective:  Tmax 99 Glucose range - 198-250 I/O - UOP 1.5L, +1.3L in 24h  PEEP 14, FiO2 90%, Peak 34, Pplat 33, driving pressure   Objective   Blood pressure (!) 125/49, pulse 86, temperature 99 F (37.2 C), temperature source Axillary, resp. rate (!) 32, weight 100 kg, SpO2 94 %.    Vent Mode: PRVC FiO2 (%):  [60 %-100 %] 90 % Set Rate:  [30 bmp] 30 bmp Vt Set:  [400 mL] 400 mL PEEP:  [14 cmH20] 14 cmH20 Plateau Pressure:  [24 cmH20-33 cmH20] 33 cmH20   Intake/Output Summary (Last 24 hours) at 06/21/2019 0840 Last data filed at 06/21/2019 0600 Gross per 24 hour  Intake 3150.88 ml  Output 2025 ml  Net 1125.88 ml   Filed Weights   06/16/19 0448  Weight: 100 kg    Examination: General:  critically ill appearing adult female lying in bed on vent in NAD, prone  HEENT: MM pink/moist, ETT Neuro: sedate  CV: s1s2 RRR, no m/r/g PULM:  Synchronous on vent, coarse breath sounds with rhonchi bilaterally GI: soft, bsx4 active  Extremities: warm/dry, trace to 1+ edema  Skin: no rashes or lesions  Resolved Hospital Problem list   Septic Shock - off pressors 1/12  Assessment & Plan:   Acute Hypoxic and Hypercapnic Respiratory Failure from COVID 19 PNA with MSSA PNA Completed remdesivir. Note hx of neck fusion.  -continue cefazolin D6/7 -low Vt ventilation 4-8cc/kg -goal plateau pressure <30, driving pressure <16 cm H2O -target PaO2 55-65, titrate PEEP/FiO2 per ARDS protocol  -P/F ratio <150, continue prone therapy for 16 hours per day -lasix 40 mg IV Q6 x2 doses  -VAP prevention measures  -follow intermittent CXR  -continue decadron   Need for Sedation due to Mechanical Ventilation  -PAD protocol  -change to dilaudid from fentanyl  -continue versed -PT klonopin, oxycodone  AKI  Hypernatremia  -continue free water  -Trend BMP / urinary output -Replace electrolytes as indicated -Avoid nephrotoxic agents, ensure adequate renal perfusion  Anemia  Mild Thrombocytopenia  -trend CBC -transfuse for Hgb <7%  DM with Steroid Induced Hyperglycemia  -SSI -increase levemir to 30 units BID -TF coverage, 8 units Q4  Best practice:  Diet: TF Pain/Anxiety/Delirium protocol (if indicated): in place VAP protocol (if indicated): yes DVT prophylaxis: lovenox GI prophylaxis: PPI, HOB elevated Glucose control: SSI Mobility: bed Code Status: full Family Communication: Sister Webb Silversmith called for update 1/15, no answer.  Message left for return call. Attempted to call [name] 1/12 with no response.  She was named on advanced directive in 2018 (confirmed documented in [facility] system).  In the ER at [facility] the patient asked that Driscilla Grammes be updated.  Both [place] are listed  as contacts but family insist that they not be updated.   Disposition: ICU  Labs   CBC: Recent Labs  Lab 06/12/2019 0346 06/09/2019 1026 06/16/19 0434 06/16/19 1631 06/17/19 0400 06/17/19 1626 06/18/19 0400 06/19/19 0457 06/19/19 0557 06/19/19 1044 06/20/19 0448 06/20/19 1508 06/21/19 0450  WBC 13.7*  --  8.2  --  6.5  --  6.2  --  10.1  --  14.7*  --  12.1*  NEUTROABS 11.1*  --  7.2  --  5.3  --  4.9  --   --   --   --   --   --   HGB 12.0   < > 11.0*   < > 10.0*   < > 9.4*   < > 10.0* 9.2* 10.8* 9.2* 9.4*  HCT 37.6   < > 35.5*   < > 32.6*   < > 29.9*   < > 33.0* 27.0* 35.9* 27.0* 32.1*  MCV 94.7  --  96.5  --  98.2  --  98.0  --  99.7  --  99.4  --  101.6*  PLT 219  --  146*  --  149*  --  132*  --  143*  --  145*  --  125*   < > = values in this interval not displayed.    Basic Metabolic Panel: Recent Labs  Lab 06/18/2019 0346 07/05/2019 0346 06/13/2019 1026 06/23/2019 1715 06/16/19 0434 06/16/19 0434 06/16/19 1631 06/16/19 1843 06/17/19 0400 06/17/19 1626 06/18/19 0400 06/19/19 0457 06/19/19 0557 06/19/19 1044 06/20/19 0448 06/20/19 1508 06/21/19 0450  NA 143   < >   < >  --  147*   < >   < >  --  147*   < > 147*   < > 150* 153* 149* 151* 149*  K 4.1   < >   < >  --  3.9   < >   < >  --  4.1   < > 4.2   < > 4.5 4.5 4.3 4.5 4.1  CL 106   < >  --   --  108   < >  --   --  110  --  111  --  113*  --  109  --  107  CO2 26   < >  --   --  28   < >  --   --  27  --  28  --  31  --  32  --  34*  GLUCOSE 283*   < >  --   --  171*   < >  --   --  305*  --  379*  --  307*  --  238*  --  248*  BUN 38*   < >  --   --  64*   < >  --   --  74*  --  76*  --  64*  --  56*  --  56*  CREATININE 1.23*   < >  --   --  1.60*   < >  --   --  1.43*  --  1.05*  --  1.04*  --  0.83  --  0.82  CALCIUM 8.4*   < >  --   --  8.5*   < >  --   --  8.2*  --  8.3*  --  8.4*  --  8.2*  --  7.7*  MG 2.5*  --   --  2.5* 2.5*  --   --  2.4 2.5*  --   --   --   --   --   --   --   --   PHOS  --   --   --   3.2 1.9*  --   --  3.1  --   --   --   --   --   --   --   --   --    < > = values in this interval not displayed.   GFR: Estimated Creatinine Clearance: 74 mL/min (by C-G formula based on SCr of 0.82 mg/dL). Recent Labs  Lab 06/18/19 0400 06/19/19 0557 06/20/19 0448 06/21/19 0450  WBC 6.2 10.1 14.7* 12.1*    Liver Function Tests: Recent Labs  Lab 06/20/2019 0346 06/16/19 0434 06/17/19 0400  AST 35 26 44*  ALT 15 13 16   ALKPHOS 130* 111 82  BILITOT 1.3* 1.0 1.7*  PROT 6.9 6.2* 5.7*  ALBUMIN 3.2* 2.8* 2.4*   No results for input(s): LIPASE, AMYLASE in the last 168 hours. No results for input(s): AMMONIA in the last 168 hours.  ABG    Component Value Date/Time   PHART 7.368 06/20/2019 1508   PCO2ART 61.6 (H) 06/20/2019 1508   PO2ART 75.0 (L) 06/20/2019 1508   HCO3 35.1 (H) 06/20/2019 1508   TCO2 37 (H) 06/20/2019 1508   ACIDBASEDEF 2.0 06/16/2019 1631   O2SAT 93.0 06/20/2019 1508     Coagulation Profile: No results for input(s): INR, PROTIME in the last 168 hours.  Cardiac Enzymes: No results for input(s): CKTOTAL, CKMB, CKMBINDEX, TROPONINI in the last 168 hours.  HbA1C: Hemoglobin A1C  Date/Time Value Ref Range Status  09/15/2015 12:00 AM 7.0  Final  04/04/2013 04:26 AM 6.0 4.2 - 6.3 % Final    Comment:    The American Diabetes Association recommends that a primary goal of therapy should be <7% and that physicians should reevaluate the treatment regimen in patients with HbA1c values consistently >8%.    Hgb A1c MFr Bld  Date/Time Value Ref Range Status  11/20/2018 10:52 AM 5.6 4.8 - 5.6 % Final    Comment:    (NOTE) Pre diabetes:          5.7%-6.4% Diabetes:              >6.4% Glycemic control for   <7.0% adults with diabetes   07/20/2016 04:28 AM 5.2 4.8 - 5.6 % Final    Comment:    (NOTE)         Pre-diabetes: 5.7 - 6.4         Diabetes: >6.4         Glycemic control for adults with diabetes: <7.0     CBG: Recent Labs  Lab  06/20/19 1612 06/20/19 1952 06/21/19 0005 06/21/19 0423 06/21/19 0746  GLUCAP 213* 215* 257* 247* 196*  Critical care time: 30 minutes    Noe Gens, MSN, NP-C [facility] 06/21/2019, 8:40 AM   Please see [email or web address] for pager details.

## 2019-06-21 NOTE — Progress Notes (Signed)
Pt supined without any complications.  ETT is secure.

## 2019-06-22 LAB — POCT I-STAT 7, (LYTES, BLD GAS, ICA,H+H)
Acid-Base Excess: 15 mmol/L — ABNORMAL HIGH (ref 0.0–2.0)
Bicarbonate: 41.1 mmol/L — ABNORMAL HIGH (ref 20.0–28.0)
Calcium, Ion: 1.14 mmol/L — ABNORMAL LOW (ref 1.15–1.40)
HCT: 33 % — ABNORMAL LOW (ref 36.0–46.0)
Hemoglobin: 11.2 g/dL — ABNORMAL LOW (ref 12.0–15.0)
O2 Saturation: 97 %
Patient temperature: 37
Potassium: 4.5 mmol/L (ref 3.5–5.1)
Sodium: 144 mmol/L (ref 135–145)
TCO2: 43 mmol/L — ABNORMAL HIGH (ref 22–32)
pCO2 arterial: 57 mmHg — ABNORMAL HIGH (ref 32.0–48.0)
pH, Arterial: 7.466 — ABNORMAL HIGH (ref 7.350–7.450)
pO2, Arterial: 85 mmHg (ref 83.0–108.0)

## 2019-06-22 LAB — BASIC METABOLIC PANEL
Anion gap: 6 (ref 5–15)
BUN: 56 mg/dL — ABNORMAL HIGH (ref 8–23)
CO2: 35 mmol/L — ABNORMAL HIGH (ref 22–32)
Calcium: 7.9 mg/dL — ABNORMAL LOW (ref 8.9–10.3)
Chloride: 104 mmol/L (ref 98–111)
Creatinine, Ser: 0.84 mg/dL (ref 0.44–1.00)
GFR calc Af Amer: >60 mL/min (ref >60)
GFR calc non Af Amer: >60 mL/min (ref >60)
Glucose, Bld: 324 mg/dL — ABNORMAL HIGH (ref 70–99)
Potassium: 4.6 mmol/L (ref 3.5–5.1)
Sodium: 145 mmol/L (ref 135–145)

## 2019-06-22 LAB — GLUCOSE, CAPILLARY
Glucose-Capillary: 292 mg/dL — ABNORMAL HIGH (ref 70–99)
Glucose-Capillary: 299 mg/dL — ABNORMAL HIGH (ref 70–99)
Glucose-Capillary: 304 mg/dL — ABNORMAL HIGH (ref 70–99)
Glucose-Capillary: 306 mg/dL — ABNORMAL HIGH (ref 70–99)
Glucose-Capillary: 311 mg/dL — ABNORMAL HIGH (ref 70–99)
Glucose-Capillary: 318 mg/dL — ABNORMAL HIGH (ref 70–99)

## 2019-06-22 LAB — CBC
HCT: 31 % — ABNORMAL LOW (ref 36.0–46.0)
Hemoglobin: 9.3 g/dL — ABNORMAL LOW (ref 12.0–15.0)
MCH: 30.1 pg (ref 26.0–34.0)
MCHC: 30 g/dL (ref 30.0–36.0)
MCV: 100.3 fL — ABNORMAL HIGH (ref 80.0–100.0)
Platelets: 120 10*3/uL — ABNORMAL LOW (ref 150–400)
RBC: 3.09 MIL/uL — ABNORMAL LOW (ref 3.87–5.11)
RDW: 15.6 % — ABNORMAL HIGH (ref 11.5–15.5)
WBC: 10.5 10*3/uL (ref 4.0–10.5)
nRBC: 0.2 % (ref 0.0–0.2)

## 2019-06-22 MED ORDER — INSULIN DETEMIR 100 UNIT/ML ~~LOC~~ SOLN
35.0000 [IU] | Freq: Two times a day (BID) | SUBCUTANEOUS | Status: DC
  Administered 2019-06-22 – 2019-06-23 (×2): 35 [IU] via SUBCUTANEOUS
  Filled 2019-06-22 (×3): qty 0.35

## 2019-06-22 MED ORDER — FUROSEMIDE 10 MG/ML IJ SOLN
40.0000 mg | Freq: Four times a day (QID) | INTRAMUSCULAR | Status: AC
  Administered 2019-06-22 (×2): 40 mg via INTRAVENOUS
  Filled 2019-06-22 (×2): qty 4

## 2019-06-22 MED ORDER — INSULIN ASPART 100 UNIT/ML ~~LOC~~ SOLN
10.0000 [IU] | SUBCUTANEOUS | Status: DC
  Administered 2019-06-22 – 2019-06-24 (×13): 10 [IU] via SUBCUTANEOUS

## 2019-06-22 MED ORDER — CLONAZEPAM 1 MG PO TABS
2.0000 mg | ORAL_TABLET | Freq: Two times a day (BID) | ORAL | Status: DC
  Administered 2019-06-22 – 2019-06-24 (×4): 2 mg
  Filled 2019-06-22 (×4): qty 2

## 2019-06-22 NOTE — Progress Notes (Signed)
Attempted to call pt friend, Tammie, no answer at this time.

## 2019-06-22 NOTE — Progress Notes (Signed)
Turned patients head. No complications.

## 2019-06-22 NOTE — Progress Notes (Signed)
Pt lavaged with 10 CC saline flush per MD order. Moderate amount of yellow/tan thick secretions suctioned out with no complications. VS within normal limits

## 2019-06-22 NOTE — Progress Notes (Signed)
NAME:  Stephanie [name], MRN:  [id number], DOB:  May 19, 1951, LOS: 7 ADMISSION DATE:  07/03/2019, CONSULTATION DATE:  06/16/2019 REFERRING MD:  Mortimer Fries, CHIEF COMPLAINT:  ARDS/resp failure   Brief History   69 y/o [name] to [facility] after testing positive for COVID on 1/2 at her nursing facility.  She was found to have hypoxic respiratory failure with saturations in the 70's on presentation. She was treated with remdesivir & steroids but had worsening respiratory failure requiring intubation on 1/8 and placement of central line. She was transferred to [facility] on 1/9 for care.   Past Medical History  HTN HLD DM  COPD / Asthma  CVA GERD Bipolar Depression  IDA  Hepatitis B  Cirrhosis  Rheumatic Fever  HLD OSA RLS   Significant Hospital Events   1/09 Tx to [place], COVID +, intubated  1/10 Fevers not responsive to tylenol, requiring more pressor support.  1/11 Prone, Peak 30/Pplat 29  1/13 prone, Peak 27/Pplat 26, Pf 97  Consults:  PCCM  Procedures:  ETT 1/9 >>  L IJ TLC 1/9 >>   Significant Diagnostic Tests:    Micro Data:  BCx2 1/10 >>  Tracheal aspirate 1/10 >> abundant MSSA  Antimicrobials:  Vanco 1/10 >> 1/12 Cefepime 1/10 >> 1/12 Cefazolin 1/12 >>   Interim history/subjective:   Remains critically ill, intubated, prone position On 90%/PEEP of 14 Afebrile last 24 hours Good urine output with Lasix Received as needed paralytic per RN  Objective   Blood pressure 122/66, pulse 85, temperature 99 F (37.2 C), temperature source Oral, resp. rate (!) 30, height 5' 3"  (1.6 m), weight 100 kg, SpO2 96 %.    Vent Mode: PRVC FiO2 (%):  [90 %] 90 % Set Rate:  [30 bmp] 30 bmp Vt Set:  [400 mL] 400 mL PEEP:  [14 cmH20] 14 cmH20 Plateau Pressure:  [26 cmH20-31 cmH20] 31 cmH20   Intake/Output Summary (Last 24 hours) at 06/22/2019 1241 Last data filed at 06/22/2019 0600 Gross per 24 hour  Intake 1466.93 ml  Output 2300 ml  Net -833.07 ml   Filed Weights   06/16/19 0448  06/21/19 0700  Weight: 100 kg 100 kg    Examination: General: critically ill appearing adult female lying in bed on vent in NAD, prone  HEENT: MM pink/moist, ETT Neuro: sedate , RASS -5 CV: s1s2 RRR, no m/r/g PULM:  Synchronous on vent, coarse breath sounds with rhonchi bilaterally GI: soft, bsx4 active  Extremities: warm/dry, trace to 1+ edema  Skin: no rashes or lesions  Chest x-ray 1/15 personally reviewed which shows increased bilateral airspace disease. Labs show hyperglycemia, normal electrolytes, stable renal function, no leukocytosis, stable anemia and thrombocytopenia  Resolved Hospital Problem list   Septic Shock - off pressors 1/12  Assessment & Plan:   Acute Hypoxic and Hypercapnic Respiratory Failure from COVID 19 PNA with MSSA PNA Completed remdesivir. Note hx of neck fusion.  -continue cefazolin D7/7 -Continue lung protective ventilation -P/F ratio <150, continue prone therapy for 16 hours per day, will supinate around 6 PM and then can hold off proning until a.m. ABG -Diurese -VAP prevention measures  -follow intermittent CXR  -Pharmacy to check, may have completed 10 days of decadron   Need for Sedation due to Mechanical Ventilation  -PAD protocol  -continue versed/Dilaudid with goal RASS -5 since still receiving intermittent paralytic -PT klonopin, oxycodone  AKI  Hypernatremia  -Lasix 40 every 6 for 2 doses -continue free water  -Trend BMP / urinary  output -Replace electrolytes as indicated -Avoid nephrotoxic agents, ensure adequate renal perfusion  Anemia  Mild Thrombocytopenia  -trend CBC -transfuse for Hgb <7%  DM with Steroid Induced Hyperglycemia  -SSI -increase levemir to 35 units BID -TF coverage, 8 units Q4   Summary-day 9 of mechanical ventilation , not much improvement in hypoxia in spite of proning and neuromuscular blockade, prognosis guarded mortality that of severe ARDS  Best practice:  Diet: TF Pain/Anxiety/Delirium protocol  (if indicated): in place VAP protocol (if indicated): yes DVT prophylaxis: lovenox GI prophylaxis: PPI, HOB elevated Glucose control: SSI Mobility: bed Code Status: full Family Communication:  [name] 2022-06-28 is  [employer] coach (son has passed). She was named on advanced directive in 2018 (confirmed documented in [facility] system).  In the ER at [facility] the patient asked that Driscilla Grammes be updated.Sister is [place] and Lenna Sciara are not family members at all T Esque said that patient would not want this kind of aggressive care for a prolonged period but she was unwilling to make any decisions without consulting her sister.  Encouraged them to participate in a group call on Monday    Disposition: ICU    The patient is critically ill with multiple organ systems failure and requires high complexity decision making for assessment and support, frequent evaluation and titration of therapies, application of advanced monitoring technologies and extensive interpretation of multiple databases. Critical Care Time devoted to patient care services described in this note independent of APP/resident  time is 35 minutes.    Kara Mead MD. Shade Flood. [facility]  If no response to pager , please call [phone]   06/29/2019

## 2019-06-22 NOTE — Progress Notes (Signed)
Patient's head repositioned and arms rotated.  Patient tolerated well.  No skin breakdown noted at this time.

## 2019-06-22 NOTE — Progress Notes (Signed)
Pt proned with no complications noted.

## 2019-06-23 ENCOUNTER — Inpatient Hospital Stay (HOSPITAL_COMMUNITY): Payer: Medicare Other

## 2019-06-23 DIAGNOSIS — J15211 Pneumonia due to Methicillin susceptible Staphylococcus aureus: Secondary | ICD-10-CM

## 2019-06-23 LAB — BASIC METABOLIC PANEL
Anion gap: 10 (ref 5–15)
BUN: 58 mg/dL — ABNORMAL HIGH (ref 8–23)
CO2: 38 mmol/L — ABNORMAL HIGH (ref 22–32)
Calcium: 8.1 mg/dL — ABNORMAL LOW (ref 8.9–10.3)
Chloride: 98 mmol/L (ref 98–111)
Creatinine, Ser: 0.85 mg/dL (ref 0.44–1.00)
GFR calc Af Amer: >60 mL/min (ref >60)
GFR calc non Af Amer: >60 mL/min (ref >60)
Glucose, Bld: 215 mg/dL — ABNORMAL HIGH (ref 70–99)
Potassium: 4.4 mmol/L (ref 3.5–5.1)
Sodium: 146 mmol/L — ABNORMAL HIGH (ref 135–145)

## 2019-06-23 LAB — GLUCOSE, CAPILLARY
Glucose-Capillary: 211 mg/dL — ABNORMAL HIGH (ref 70–99)
Glucose-Capillary: 217 mg/dL — ABNORMAL HIGH (ref 70–99)
Glucose-Capillary: 234 mg/dL — ABNORMAL HIGH (ref 70–99)
Glucose-Capillary: 242 mg/dL — ABNORMAL HIGH (ref 70–99)
Glucose-Capillary: 316 mg/dL — ABNORMAL HIGH (ref 70–99)
Glucose-Capillary: 348 mg/dL — ABNORMAL HIGH (ref 70–99)

## 2019-06-23 LAB — CULTURE, BLOOD (ROUTINE X 2)
Culture: NO GROWTH
Culture: NO GROWTH
Special Requests: ADEQUATE
Special Requests: ADEQUATE

## 2019-06-23 LAB — CBC
HCT: 31.2 % — ABNORMAL LOW (ref 36.0–46.0)
Hemoglobin: 9.3 g/dL — ABNORMAL LOW (ref 12.0–15.0)
MCH: 29.7 pg (ref 26.0–34.0)
MCHC: 29.8 g/dL — ABNORMAL LOW (ref 30.0–36.0)
MCV: 99.7 fL (ref 80.0–100.0)
Platelets: 138 10*3/uL — ABNORMAL LOW (ref 150–400)
RBC: 3.13 MIL/uL — ABNORMAL LOW (ref 3.87–5.11)
RDW: 15.3 % (ref 11.5–15.5)
WBC: 9.4 10*3/uL (ref 4.0–10.5)
nRBC: 0 % (ref 0.0–0.2)

## 2019-06-23 LAB — PHOSPHORUS: Phosphorus: 3.3 mg/dL (ref 2.5–4.6)

## 2019-06-23 LAB — MAGNESIUM: Magnesium: 2.7 mg/dL — ABNORMAL HIGH (ref 1.7–2.4)

## 2019-06-23 MED ORDER — INSULIN DETEMIR 100 UNIT/ML ~~LOC~~ SOLN
40.0000 [IU] | Freq: Two times a day (BID) | SUBCUTANEOUS | Status: DC
  Administered 2019-06-23 – 2019-06-24 (×2): 40 [IU] via SUBCUTANEOUS
  Filled 2019-06-23 (×3): qty 0.4

## 2019-06-23 MED ORDER — STERILE WATER FOR INJECTION IJ SOLN
INTRAMUSCULAR | Status: AC
  Administered 2019-06-23: 17:00:00 10 mL
  Filled 2019-06-23: qty 10

## 2019-06-23 MED ORDER — STERILE WATER FOR INJECTION IJ SOLN
INTRAMUSCULAR | Status: AC
  Administered 2019-06-23: 10 mL
  Filled 2019-06-23: qty 10

## 2019-06-23 NOTE — Progress Notes (Signed)
Pt having increased peak pressures, low spo2. Pt switched to PC per Dr Elsworth Soho however VT <200, MV < 3. Pt placed back on PRVC and RR dropped to 26. Pt ett untaped and replaced with commercial tube holder at 23 at the lip. HME and yellow filters changed. Peak pressures returned to normal (around 30), spo2 improved to 90's. Pt stable at this time. MD aware.

## 2019-06-23 NOTE — Progress Notes (Signed)
NAME:  Stephanie [name], MRN:  [id number], DOB:  12-10-1950, LOS: 8 ADMISSION DATE:  07/05/2019, CONSULTATION DATE:  06/08/2019 REFERRING MD:  Mortimer Fries, CHIEF COMPLAINT:  ARDS/resp failure   Brief History   69 y/o [name] to [facility] after testing positive for COVID on 1/2 at her nursing facility.  She was found to have hypoxic respiratory failure with saturations in the 70's on presentation. She was treated with remdesivir & steroids but had worsening respiratory failure requiring intubation on 1/8 and placement of central line. She was transferred to [facility] on 1/9 for care.   Past Medical History  HTN HLD DM  COPD / Asthma  CVA GERD Bipolar Depression  IDA  Hepatitis B  Cirrhosis  Rheumatic Fever  HLD OSA RLS   Significant Hospital Events   1/09 Tx to [place], COVID +, intubated  1/10 Fevers not responsive to tylenol, requiring more pressor support.  1/11 Prone, Peak 30/Pplat 29  1/13 prone, Peak 27/Pplat 26, Pf 97 1/16 On 90%/PEEP of 14 , prone ,paralysed  Consults:  PCCM  Procedures:  ETT 1/9 >>  L IJ TLC 1/9 >>   Significant Diagnostic Tests:    Micro Data:  BCx2 1/10 >> ng Tracheal aspirate 1/10 >> abundant MSSA  Antimicrobials:  Vanco 1/10 >> 1/12 Cefepime 1/10 >> 1/12 Cefazolin 1/12 >>   Interim history/subjective:   Critically ill, intubated Prone position, 80%/PEEP of 14 Good urine output with Lasix Afebrile   Objective   Blood pressure (!) 102/52, pulse 73, temperature 98.7 F (37.1 C), resp. rate (!) 30, height 5' 3"  (1.6 m), weight 100 kg, SpO2 97 %.    Vent Mode: PRVC FiO2 (%):  [70 %-90 %] 70 % Set Rate:  [30 bmp] 30 bmp Vt Set:  [400 mL] 400 mL PEEP:  [14 cmH20] 14 cmH20 Plateau Pressure:  [18 cmH20-35 cmH20] 33 cmH20   Intake/Output Summary (Last 24 hours) at 06/23/2019 1641 Last data filed at 06/23/2019 0458 Gross per 24 hour  Intake 2090 ml  Output 2700 ml  Net -610 ml   Filed Weights   06/16/19 0448 06/21/19 0700  Weight: 100 kg 100  kg    Examination: General: critically ill appearing adult female lying in bed on vent in NAD, prone  HEENT: MM pink/moist, oral ETT, mild pallor Neuro: sedate , RASS -5 CV: s1s2 RRR, no m/r/g PULM:  Synchronous on vent, coarse breath sounds with rhonchi bilaterally GI: Prone Extremities: warm/dry, trace to 1+ edema  Skin: no rashes or lesions  Chest x-ray 1/17 personally reviewed, no change in bilateral multifocal infiltrates  Labs show stable electrolytes, good renal function, stable BUN, stable anemia no leukocytosis  Resolved Hospital Problem list   Septic Shock - off pressors 1/12  MSSA PNA -completed 7 days of cefazolin  Assessment & Plan:   Acute Hypoxic and Hypercapnic Respiratory Failure from COVID 19 PNA with  Completed remdesivir.  -Continue lung protective ventilation -P/F ratio <150, continue prone therapy for 16 hours per day -Diurese -VAP prevention measures    -Decadron until 1/18  Need for Sedation due to Mechanical Ventilation  -PAD protocol  -continue versed/Dilaudid with goal RASS -5 since still receiving intermittent paralytic -PT klonopin, oxycodone  AKI  Hypernatremia  -Lasix 40 daily -continue free water  -Trend BMP / urinary output -Replace electrolytes as indicated -Avoid nephrotoxic agents, ensure adequate renal perfusion  Anemia  Mild Thrombocytopenia  -trend CBC -transfuse for Hgb <7%  DM with Steroid Induced Hyperglycemia  -SSI -  increase levemir to 40 units BID -TF coverage, 8 units Q4   Summary-day 10 of mechanical ventilation , not much improvement in hypoxia in spite of proning and neuromuscular blockade, prognosis guarded, mortality that of severe ARDS  Best practice:  Diet: TF Pain/Anxiety/Delirium protocol (if indicated): in place VAP protocol (if indicated): yes DVT prophylaxis: lovenox GI prophylaxis: PPI, HOB elevated Glucose control: SSI Mobility: bed Code Status: full Family Communication:  [name] 1/16 is   [employer] coach (son has passed). She was named on advanced directive in 2018 (confirmed documented in [facility] system).  Driscilla Grammes is a friend.Sister is [place] and Lenna Sciara are not family members at all T Esque said that patient would not want this kind of aggressive care for a prolonged period but she was unwilling to make any decisions without consulting her sister.  Encouraged them to participate in a group call on Monday    Disposition: ICU    The patient is critically ill with multiple organ systems failure and requires high complexity decision making for assessment and support, frequent evaluation and titration of therapies, application of advanced monitoring technologies and extensive interpretation of multiple databases. Critical Care Time devoted to patient care services described in this note independent of APP/resident  time is 35 minutes.     Kara Mead MD. Shade Flood. [facility]  If no response to pager , please call [phone]   06/23/2019

## 2019-06-23 NOTE — Progress Notes (Signed)
Placed patient in prone position. ETT secured with cloth tape. Arms placed in swimmers position. Tolerated well. No complications.

## 2019-06-23 NOTE — Progress Notes (Signed)
Pt placed supine at this time with no complications. Moderate abrasion noted to septum and hydrocoiloid and mepilex added. Pt ett retaped and secured in proper position. Pt with multiple skin tears on cheeks and under left breast. Mepilex added to all of them and RN aware. Pt could benefit from "prone vacation" at this time due to tears.

## 2019-06-24 LAB — BASIC METABOLIC PANEL
Anion gap: 6 (ref 5–15)
BUN: 56 mg/dL — ABNORMAL HIGH (ref 8–23)
CO2: 38 mmol/L — ABNORMAL HIGH (ref 22–32)
Calcium: 7.7 mg/dL — ABNORMAL LOW (ref 8.9–10.3)
Chloride: 100 mmol/L (ref 98–111)
Creatinine, Ser: 0.71 mg/dL (ref 0.44–1.00)
GFR calc Af Amer: >60 mL/min (ref >60)
GFR calc non Af Amer: >60 mL/min (ref >60)
Glucose, Bld: 233 mg/dL — ABNORMAL HIGH (ref 70–99)
Potassium: 4.8 mmol/L (ref 3.5–5.1)
Sodium: 144 mmol/L (ref 135–145)

## 2019-06-24 LAB — CBC
HCT: 29 % — ABNORMAL LOW (ref 36.0–46.0)
Hemoglobin: 8.8 g/dL — ABNORMAL LOW (ref 12.0–15.0)
MCH: 30.6 pg (ref 26.0–34.0)
MCHC: 30.3 g/dL (ref 30.0–36.0)
MCV: 100.7 fL — ABNORMAL HIGH (ref 80.0–100.0)
Platelets: 136 10*3/uL — ABNORMAL LOW (ref 150–400)
RBC: 2.88 MIL/uL — ABNORMAL LOW (ref 3.87–5.11)
RDW: 15.2 % (ref 11.5–15.5)
WBC: 9.1 10*3/uL (ref 4.0–10.5)
nRBC: 0 % (ref 0.0–0.2)

## 2019-06-24 LAB — GLUCOSE, CAPILLARY
Glucose-Capillary: 186 mg/dL — ABNORMAL HIGH (ref 70–99)
Glucose-Capillary: 215 mg/dL — ABNORMAL HIGH (ref 70–99)
Glucose-Capillary: 278 mg/dL — ABNORMAL HIGH (ref 70–99)
Glucose-Capillary: 237 mg/dL — ABNORMAL HIGH (ref 70–99)
Glucose-Capillary: 248 mg/dL — ABNORMAL HIGH (ref 70–99)

## 2019-06-24 MED ORDER — GLYCOPYRROLATE 0.2 MG/ML IJ SOLN: 0.2000 mg | INTRAMUSCULAR | Status: DC | PRN

## 2019-06-24 MED ORDER — DIPHENHYDRAMINE HCL 50 MG/ML IJ SOLN: 25.0000 mg | INTRAMUSCULAR | Status: DC | PRN

## 2019-06-24 MED ORDER — OXYCODONE HCL 5 MG PO TABS
10.0000 mg | ORAL_TABLET | Freq: Four times a day (QID) | ORAL | Status: DC
  Administered 2019-06-24: 17:00:00 10 mg
  Filled 2019-06-24: qty 2

## 2019-06-24 MED ORDER — ACETAMINOPHEN 650 MG RE SUPP: 650.0000 mg | Freq: Four times a day (QID) | RECTAL | Status: DC | PRN

## 2019-06-24 MED ORDER — CEFAZOLIN SODIUM-DEXTROSE 2-4 GM/100ML-% IV SOLN
2.0000 g | Freq: Three times a day (TID) | INTRAVENOUS | Status: DC
  Administered 2019-06-24 (×2): 2 g via INTRAVENOUS
  Filled 2019-06-24 (×4): qty 100

## 2019-06-24 MED ORDER — FUROSEMIDE 10 MG/ML IJ SOLN
80.0000 mg | Freq: Two times a day (BID) | INTRAMUSCULAR | Status: AC
  Administered 2019-06-24 (×2): 80 mg via INTRAVENOUS
  Filled 2019-06-24 (×2): qty 8

## 2019-06-24 MED ORDER — DEXTROSE 5 % IV SOLN: INTRAVENOUS | Status: DC

## 2019-06-24 MED ORDER — ACETAMINOPHEN 325 MG PO TABS: 650.0000 mg | ORAL_TABLET | Freq: Four times a day (QID) | ORAL | Status: DC | PRN

## 2019-06-24 MED ORDER — GLYCOPYRROLATE 1 MG PO TABS
1.0000 mg | ORAL_TABLET | ORAL | Status: DC | PRN
  Filled 2019-06-24: qty 1

## 2019-06-24 MED ORDER — VITAL AF 1.2 CAL PO LIQD
1000.0000 mL | ORAL | Status: DC
  Administered 2019-06-24: 17:00:00 1000 mL

## 2019-06-24 MED ORDER — POLYVINYL ALCOHOL 1.4 % OP SOLN
1.0000 [drp] | Freq: Four times a day (QID) | OPHTHALMIC | Status: DC | PRN
  Filled 2019-06-24: qty 15

## 2019-07-02 ENCOUNTER — Ambulatory Visit (INDEPENDENT_AMBULATORY_CARE_PROVIDER_SITE_OTHER): Payer: Medicare Other | Admitting: Nurse Practitioner

## 2019-07-08 NOTE — Death Summary Note (Signed)
DEATH SUMMARY   Patient Details  Name: Stephanie [name] MRN: [id number] DOB: 1950-11-11  Admission/Discharge Information   Admit Date:  07/02/19  Date of Death: Date of Death: Jul 11, 2019  Time of Death: Time of Death: 08-18-07  Length of Stay: Aug 01, 2022  Referring Physician: Odessa Fleming, NP   Reason(s) for Hospitalization  Acute hypoxic respiratory failure due to COVID pneumonia.  Diagnoses  Preliminary cause of death: Acute hypoxic respiratory failure Secondary Diagnoses (including complications and co-morbidities):  Active Problems:   Acute respiratory distress syndrome (ARDS) due to COVID-19 virus (HCC)   Acute respiratory failure with hypercapnia (HCC)  Past Medical History:  Diagnosis Date  . Adenomatous colon polyp 08/27/2014   Polyps x 3  . Anemia   . Anxiety   . 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 Arthritis   . Asthma   . Bipolar affective disorder ([place])    h/o  . Blood transfusion August 17, 1998  . Blood transfusion without reported diagnosis   . Cancer ([place])   . Chronic abdominal pain    abdominal wall pain  . 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 Colon polyp   . COPD (chronic obstructive pulmonary disease) ([place])   . Depression   . Diabetes mellitus without complication ([place])   . Fatty liver 04/09/08   found in abd CT  . GERD (gastroesophageal reflux disease)   . H/O: CVA (cardiovascular accident)    TIA  . H/O: rheumatic fever   . Hepatitis    HEP "B" twenty years ago  . Hyperlipidemia   . Hypertension   . IBS (irritable bowel syndrome)   . Incontinence   . Insomnia   . Lower extremity edema   . Neuropathy    lower legs D/T DM  . Obstructive sleep apnea    Use C-PAP twice per week  . Platelets decreased ([place])   . RLS (restless legs syndrome)   . Shortness of breath   . Sleep apnea   . Stroke [facility])    TIA- Aug 17, 2000  . Ulcer   . Unspecified disorders of nervous system   . Wears dentures    full upper and lower    Brief Hospital Course (including  significant findings, care, treatment, and services provided and events leading to death)  Stephanie [name] is a 69 y.o. year old female who presented to the emergency department in [place] for increasing shortness of breath with hypoxia.  She was admitted there and started on remdesivir and steroids.  Her condition continued to worsen and she was transferred to [facility] where she ultimately required intubation 2022/07/01.  She underwent prone ventilation with neuromuscular blockade from 1/11-1/17.  Oxygenation was improving but she continued require high level sedation to tolerate mechanical ventilation.  In the meantime, the family conferred regarding the patient's wishes and her long-term goals of care.  She had many medical problems and had been in declining health for several years.  Her substitute healthcare decision maker stated that she was not happy with her quality of life being confined to her skilled nursing facility.  We discussed that while the patient had a reasonable chance of recovering it it would take a long time and would require her to not suffer any further complications.  It was also made clear that at most she would return to her previous quality of life and that more likely than not she would be less independent than she had previously been.  This was  deemed to be unacceptable to the patient given her prior feelings with regards to prolonged life support and it was decided that the most reasonable course of action was to transition to comfort care.  The patient passed away comfortably.  No autopsy was performed.   Pertinent Labs and Studies  Significant Diagnostic Studies DG Chest 1 View  Result Date: 07/02/2019 CLINICAL DATA:  COVID pneumonia, intubated EXAM: CHEST  1 VIEW COMPARISON:  06/14/2019 FINDINGS: Endotracheal tube 1.6 cm above the carina. NG tube enters the stomach tip. Left IJ central tip SVC RA junction as before. Thoracic stimulator noted. Minimal improvement in the diffuse  bilateral interstitial opacities compatible with atypical/viral pneumonia. Heart is enlarged. Difficult to exclude superimposed edema. No large effusion or pneumothorax. Lower cervical fusion also evident. IMPRESSION: Stable cardiomegaly.  Stable support apparatus. Slight improvement in diffuse interstitial opacities compatible with atypical/viral pneumonia versus edema. Electronically Signed   By: Jerilynn Mages.  Shick M.D.   On: 06/20/2019 11:34   DG Abd 1 View  Result Date: 06/14/2019 CLINICAL DATA:  Check gastric catheter placement EXAM: ABDOMEN - 1 VIEW COMPARISON:  None. FINDINGS: Scattered large and small bowel gas is noted. Gastric catheter is noted coiled within the distal stomach. Postsurgical changes are seen. IMPRESSION: Gastric catheter within the stomach. Electronically Signed   By: [name] Catalina M.D.   On: 06/14/2019 17:51   DG Chest Port 1 View  Result Date: 06/23/2019 CLINICAL DATA:  Acute respiratory failure. EXAM: PORTABLE CHEST 1 VIEW COMPARISON:  June 21, 2019. FINDINGS: Stable cardiomediastinal silhouette. Endotracheal and feeding tubes are unchanged in position. Left internal jugular catheter is unchanged. Stable bilateral lung opacities are noted, right greater than left, consistent with multifocal pneumonia. Bony thorax is unremarkable. IMPRESSION: Stable support apparatus. Stable bilateral lung opacities are noted concerning for multifocal pneumonia. Electronically Signed   By: Marijo Conception M.D.   On: 06/23/2019 08:54   DG CHEST PORT 1 VIEW  Result Date: 06/21/2019 CLINICAL DATA:  Acute respiratory failure with hypoxia. EXAM: PORTABLE CHEST 1 VIEW COMPARISON:  06/18/2019 FINDINGS: Enteric fusion tube courses into the region of the duodenum and off the film as tip is not visualized. Endotracheal tube and left IJ central venous catheter are unchanged. Neural stimulator device has tip at the approximate T10 level unchanged. Lungs are adequately inflated with moderate bilateral hazy  airspace process with possible slight interval worsening over the right lung likely multifocal infection versus edema. No evidence of effusion. Cardiomediastinal silhouette and remainder of the exam is unchanged. IMPRESSION: 1. Persistent bilateral multifocal hazy airspace process with slight interval worsening over the right lung likely multifocal infection and less likely edema. 2.  Tubes and lines as described. Electronically Signed   By: Marin Olp M.D.   On: 06/21/2019 07:53   DG Chest Port 1 View  Result Date: 06/18/2019 CLINICAL DATA:  Acute respiratory failure with hypoxia EXAM: PORTABLE CHEST 1 VIEW COMPARISON:  Three days ago FINDINGS: Endotracheal tube tip is just below the clavicular heads. Left IJ line with tip at the SVC. Enteric tube is difficult to visualize due to soft tissue attenuation but likely reaches the stomach. Generalized interstitial and airspace infiltrates with mild worsening at the right base. No effusion or pneumothorax. Cardiomegaly. IMPRESSION: 1. Stable hardware positioning. 2. Extensive pulmonary opacification with mild worsening at the right base. Electronically Signed   By: Monte Fantasia M.D.   On: 06/18/2019 07:38   DG Chest Port 1 View  Result Date: 06/14/2019 CLINICAL DATA:  Status post intubation EXAM: PORTABLE CHEST 1 VIEW COMPARISON:  06/11/2019 FINDINGS: Cardiac shadow is enlarged but stable. Endotracheal tube is noted 6 mm above the carina. This should be withdrawn 1-2 cm. Gastric catheter is noted within the stomach. Left jugular central line is seen at the cavoatrial junction. Scattered parenchymal opacities are increased when compared with the prior exam. These are consistent with the given clinical history of COVID-19 positivity. No other focal abnormality is seen. IMPRESSION: Status post intubation. The endotracheal tube is at the level of the carina and should be withdrawn 1-2 cm. Scattered parenchymal opacities increased from the prior exam consistent  with the given clinical history. Gastric catheter and left jugular line in satisfactory position. Electronically Signed   By: [name] Catalina M.D.   On: 06/14/2019 17:51   DG Chest Port 1 View  Result Date: 06/11/2019 CLINICAL DATA:  Shortness of breath.  COVID-19 positive EXAM: PORTABLE CHEST 1 VIEW COMPARISON:  November 20, 2018 FINDINGS: There is airspace consolidation in both mid and lower lung zones as well as in the right upper lobe. There is cardiomegaly with pulmonary vascularity normal. No adenopathy. Postoperative change noted in the lower cervical region. Thoracic stimulator lead tips are in the lower thoracic region. IMPRESSION: Multifocal airspace consolidation consistent with multifocal pneumonia. Stable cardiomegaly. No adenopathy evident. Electronically Signed   By: Lowella Grip III M.D.   On: 06/11/2019 12:49    Microbiology Recent Results (from the past 240 hour(s))  Culture, respiratory (non-expectorated)     Status: None   Collection Time: 06/16/19 11:05 AM   Specimen: Tracheal Aspirate; Respiratory  Result Value Ref Range Status   Specimen Description   Final    TRACHEAL ASPIRATE Performed at [facility] [address] [place], [place] [address]    Special Requests   Final    NONE Performed at [facility], [place] [address], [place], [place] [address]    Gram Stain   Final    ABUNDANT WBC PRESENT, PREDOMINANTLY PMN MODERATE GRAM POSITIVE COCCI IN PAIRS IN CLUSTERS Performed at [facility], [place] [address], [place], [place] [address]    Culture ABUNDANT STAPHYLOCOCCUS AUREUS  Final   Report Status 06/18/2019 FINAL  Final   Organism ID, Bacteria STAPHYLOCOCCUS AUREUS  Final      Susceptibility   Staphylococcus aureus - MIC*    CIPROFLOXACIN <=0.5 SENSITIVE Sensitive     ERYTHROMYCIN >=8 RESISTANT Resistant     GENTAMICIN <=0.5 SENSITIVE Sensitive     OXACILLIN 0.5 SENSITIVE Sensitive     TETRACYCLINE <=1 SENSITIVE  Sensitive     VANCOMYCIN <=0.5 SENSITIVE Sensitive     TRIMETH/SULFA <=10 SENSITIVE Sensitive     CLINDAMYCIN RESISTANT Resistant     RIFAMPIN <=0.5 SENSITIVE Sensitive     Inducible Clindamycin POSITIVE Resistant     * ABUNDANT STAPHYLOCOCCUS AUREUS  Culture, blood (routine x 2)     Status: None   Collection Time: 06/16/19 12:40 PM   Specimen: BLOOD  Result Value Ref Range Status   Specimen Description BLOOD RIGHT ARM  Final   Special Requests   Final    BOTTLES DRAWN AEROBIC ONLY Blood Culture adequate volume Performed at [facility] [address], [place], [place] [address]    Culture NO GROWTH 7 DAYS  Final   Report Status 06/23/2019 FINAL  Final  Culture, blood (routine x 2)     Status: None   Collection Time: 06/16/19 12:45 PM  Specimen: BLOOD  Result Value Ref Range Status   Specimen Description BLOOD RIGHT HAND  Final   Special Requests   Final    BOTTLES DRAWN AEROBIC ONLY Blood Culture adequate volume Performed at [facility] [address], [place], [place] [address]    Culture NO GROWTH 7 DAYS  Final   Report Status 06/23/2019 FINAL  Final  MRSA PCR Screening     Status: None   Collection Time: 06/17/19  4:46 PM   Specimen: Nasal Mucosa; Nasopharyngeal  Result Value Ref Range Status   MRSA by PCR NEGATIVE NEGATIVE Final    Comment:        The GeneXpert MRSA Assay (FDA approved for NASAL specimens only), is one component of a comprehensive MRSA colonization surveillance program. It is not intended to diagnose MRSA infection nor to guide or monitor treatment for MRSA infections. Performed at [facility], [place] [address] [place], [place] [address]     Lab Basic Metabolic Panel: Recent Labs  Lab 06/20/19 0448 06/20/19 1508 06/21/19 0450 06/21/19 0450 06/21/19 1605 06/22/19 0450 06/22/19 2139 06/23/19 0440 06/26/2019 0045  NA 149*   < > 149*   < > 148* 145 144 146* 144  K 4.3   < > 4.1    < > 4.9 4.6 4.5 4.4 4.8  CL 109  --  107  --   --  104  --  98 100  CO2 32  --  34*  --   --  35*  --  38* 38*  GLUCOSE 238*  --  248*  --   --  324*  --  215* 233*  BUN 56*  --  56*  --   --  56*  --  58* 56*  CREATININE 0.83  --  0.82  --   --  0.84  --  0.85 0.71  CALCIUM 8.2*  --  7.7*  --   --  7.9*  --  8.1* 7.7*  MG  --   --   --   --   --   --   --  2.7*  --   PHOS  --   --   --   --   --   --   --  3.3  --    < > = values in this interval not displayed.   Liver Function Tests: No results for input(s): AST, ALT, ALKPHOS, BILITOT, PROT, ALBUMIN in the last 168 hours. No results for input(s): LIPASE, AMYLASE in the last 168 hours. No results for input(s): AMMONIA in the last 168 hours. CBC: Recent Labs  Lab 06/20/19 0448 06/20/19 1508 06/21/19 0450 06/21/19 0450 06/21/19 1605 06/22/19 0450 06/22/19 2139 06/23/19 0440 2019/06/26 0045  WBC 14.7*  --  12.1*  --   --  10.5  --  9.4 9.1  HGB 10.8*   < > 9.4*   < > 9.9* 9.3* 11.2* 9.3* 8.8*  HCT 35.9*   < > 32.1*   < > 29.0* 31.0* 33.0* 31.2* 29.0*  MCV 99.4  --  101.6*  --   --  100.3*  --  99.7 100.7*  PLT 145*  --  125*  --   --  120*  --  138* 136*   < > = values in this interval not displayed.   Cardiac Enzymes: No results for input(s): CKTOTAL, CKMB, CKMBINDEX, TROPONINI in the last 168 hours. Sepsis Labs: Recent Labs  Lab 06/21/19 0450  06/22/19 0450 06/23/19 0440 Jul 18, 2019 0045  WBC 12.1* 10.5 9.4 9.1    Procedures/Operations  Mechanical ventilation   Ellanor Feuerstein 06/25/2019, 1:05 PM

## 2019-07-08 NOTE — Final Progress Note (Signed)
Patient TOD 2109 pronounced by this RN and Simmie Davies, RN per MD order. Patient transferred to morgue with belongings. HCPOA updated by phone at TOD. 64m's of versed and 1061ms of dilaudid wasted by this RN and BiSimmie DaviesRN.

## 2019-07-08 NOTE — Progress Notes (Signed)
Nutrition Follow-up  DOCUMENTATION CODES:   Obesity unspecified  INTERVENTION:   Continue TF via Cortrak:   Change to Vital AF 1.2 at 70 ml/h (1680 ml per day)   D/C Pro-stat   Provides 2016 kcal, 126 gm protein, 1362 ml free water daily   Free water flushes 200 ml every 4 hours  NUTRITION DIAGNOSIS:   Increased nutrient needs related to acute illness(COVID 19) as evidenced by estimated needs.  Ongoing  GOAL:   Patient will meet greater than or equal to 90% of their needs   Being addressed with TF  MONITOR:   Vent status, TF tolerance, Skin, Labs  ASSESSMENT:   69 yo female admitted with ARDS, respiratory failure r/t COVID-19. PMH includes HTN, HLD, DM, COPD, asthma, stroke, GERD, depression, anxiety, bipolar disorder, iron deficiency anemia, CHF. Patient was hospitalized at [facility] from 1/5 until transfer to [facility] on 1/9. She tested positive for COVID on 1/1. She lives in a nursing facility and is dependent for most ADLs.   1/13 Cortrak tube placed, tip is postpyloric  Patient remains intubated on ventilator support, tolerated supine position last night.  MV: 10.2 L/min Temp (24hrs), Avg:98.4 F (36.9 C), Min:97.9 F (36.6 C), Max:99 F (37.2 C)   Labs reviewed.  CBG's: [phone] Medications reviewed.  Receiving Vital 1.5 at 40 ml/h via Cortrak with Pro-stat 30 ml TID to provide 1740 kcals, 110 gm protein, 733 ml free water daily. Tolerating without difficulty. Free water flushes 200 ml every 4 hours.  I/O +4.8 L since admission Question accuracy of weights, bed scale not working.  NUTRITION - FOCUSED PHYSICAL EXAM:  unable to complete due to COVID restrictions  Diet Order:   Diet Order    None      EDUCATION NEEDS:   Not appropriate for education at this time  Skin:  Skin Assessment: Skin Integrity Issues: Skin Integrity Issues:: DTI DTI: L knee  Last BM:  1/18  Height:   Ht Readings from Last 1 Encounters:  06/21/19 5' 3"  (1.6 m)     Weight:   Wt Readings from Last 1 Encounters:  06/21/19 100 kg    Ideal Body Weight:  52.3 kg  BMI:  Body mass index is 39.05 kg/m.  Estimated Nutritional Needs:   Kcal:  3536-1443  Protein:  >/= 105 gm  Fluid:  >/= 1.8 L    Molli Barrows, RD, LDN, [place] Pager [phone] After Hours Pager [phone]

## 2019-07-08 NOTE — Progress Notes (Signed)
NAME:  Stephanie [name], MRN:  [id number], DOB:  [birth year], LOS: 9 ADMISSION DATE:  07/06/2019, CONSULTATION DATE:  06/08/2019 REFERRING MD:  Mortimer Fries, CHIEF COMPLAINT:  ARDS/resp failure   Brief History   69 y/o [name] to [facility] after testing positive for COVID on 1/2 at her nursing facility.  She was found to have hypoxic respiratory failure with saturations in the 70's on presentation. She was treated with remdesivir & steroids but had worsening respiratory failure requiring intubation on 1/8 and placement of central line. She was transferred to [facility] on 1/9 for care.   Past Medical History  HTN HLD DM  COPD / Asthma  CVA GERD Bipolar Depression  IDA  Hepatitis B  Cirrhosis  Rheumatic Fever  HLD OSA RLS   Significant Hospital Events   1/09 Tx to [place], COVID +, intubated  1/10 Fevers not responsive to tylenol, requiring more pressor support.  1/11 Prone, Peak 30/Pplat 29  1/13 prone, Peak 27/Pplat 26, Pf 97 1/16 On 90%/PEEP of 14 , prone ,paralysed 1/17 Supine again with acceptable oxygenation.   Consults:  PCCM  Procedures:  ETT 1/9 >>  L IJ TLC 1/9 >>   Significant Diagnostic Tests:    Micro Data:  BCx2 1/10 >> ng Tracheal aspirate 1/10 >> abundant MSSA  Antimicrobials:  Vanco 1/10 >> 1/12 Cefepime 1/10 >> 1/12 Cefazolin 1/12 >>   Interim history/subjective:   Tolerated supine ventilation overnight.  Objective   Blood pressure (!) 126/55, pulse 88, temperature 98.6 F (37 C), temperature source Axillary, resp. rate (!) 26, height 5' 3"  (1.6 m), weight 100 kg, SpO2 90 %.    Vent Mode: PRVC FiO2 (%):  [70 %-100 %] 90 % Set Rate:  [26 bmp-30 bmp] 26 bmp Vt Set:  [400 mL] 400 mL PEEP:  [14 cmH20] 14 cmH20 Plateau Pressure:  [29 cmH20-33 cmH20] 33 cmH20   Intake/Output Summary (Last 24 hours) at 29-Jun-2019 1201 Last data filed at 06-29-19 0600 Gross per 24 hour  Intake 640.62 ml  Output 550 ml  Net 90.62 ml   Filed Weights   06/16/19 0448 06/21/19  0700  Weight: 100 kg 100 kg    Examination: General: Obese woman, intubated and sedated. HEENT: MM pink/moist, oral ETT, core track tube in place Neuro: sedated , RASS -5 no response to painful stimuli. CV: s1s2 RRR, no m/r/g PULM:  Synchronous on vent, chest clear throughout GI: Soft nontender. Extremities: warm/dry, moderate dependent edema Skin: Areas of skin breakdown from prior prone ventilation.  Line sites intact.   Resolved Hospital Problem list   Septic Shock - off pressors 1/12  Assessment & Plan:   Critically ill due to acute Hypoxic and Hypercapnic Respiratory Failure from COVID 19 PNA  Continue lung protective ventilation as per  ARDSnet protocol. Resume prone ventilation for refractory hypoxemia or inability to maintain lung protective ventilation. Prognosis remains guarded given comorbidities and minimal progress despite a week of support.  COVID-19 pneumonia Completed remdesivir for 5 days Will complete 10 days of Decadron to end on 1/18  Need for Sedation due to Mechanical Ventilation  -PAD protocol  -continue versed/Dilaudid with goal RASS -5 since still receiving intermittent paralytic -PT klonopin, oxycodone  Anemia of acute illness Mild Thrombocytopenia  -trend CBC -transfuse for Hgb <7%  DM with Steroid Induced Hyperglycemia  Continue basal bolus coverage    Best practice:  Diet: TF at goal-high nutritional risk. Pain/Anxiety/Delirium protocol (if indicated): Hydromorphone/midazolam infusions.  Will increase enteral sedatives to facilitate  drip weaning. VAP protocol (if indicated): yes Ventilatory goals: Continue to wean FiO2 as tolerated to 0.4 before slow PEEP wean.  May require tracheostomy eventually DVT prophylaxis: lovenox GI prophylaxis: PPI, HOB elevated Glucose control: Uncontrolled hyperglycemia improving on increased dose of Levemir.  No change today as steroids scheduled to discontinue tomorrow Mobility: bed Urinary catheter:  External catheter in place Central line: Triple-lumen IJ required due to poor IV access. Code Status: full Family Communication:  [name] 07/03/22 is  [employer] (son has passed). She was named on advanced directive in 2018 (confirmed documented in [facility] system).  Driscilla Grammes is a friend.Sister is [place] and Lenna Sciara are not family members at all T Esque said that patient would not want this kind of aggressive care for a prolonged period but she was unwilling to make any decisions without consulting her sister.  Encouraged them to participate in a group call on Monday Disposition: ICU  CRITICAL CARE Performed by: Kipp Brood   Total critical care time: 40 minutes  Critical care time was exclusive of separately billable procedures and treating other patients.  Critical care was necessary to treat or prevent imminent or life-threatening deterioration.  Critical care was time spent personally by me on the following activities: development of treatment plan with patient and/or surrogate as well as nursing, discussions with consultants, evaluation of patient's response to treatment, examination of patient, obtaining history from patient or surrogate, ordering and performing treatments and interventions, ordering and review of laboratory studies, ordering and review of radiographic studies, pulse oximetry, re-evaluation of patient's condition and participation in multidisciplinary rounds.  Kipp Brood, MD [facility] ICU Physician [place]  Pager: [phone] Mobile: [phone] After hours: [phone].    07-06-2019

## 2019-07-08 NOTE — Progress Notes (Signed)
Patient transitioned to comfort measures; she was weaned off of Versed; extubated at 2040 and seemingly comfortable. Will continue to monitor patient and keep comfortable through end of life transition.

## 2019-07-08 NOTE — Progress Notes (Signed)
Inpatient Diabetes Program Recommendations  AACE/ADA: New Consensus Statement on Inpatient Glycemic Control (2015)  Target Ranges:  Prepandial:   less than 140 mg/dL      Peak postprandial:   less than 180 mg/dL (1-2 hours)      Critically ill patients:  140 - 180 mg/dL   Lab Results  Component Value Date   GLUCAP 215 (H) 07-22-19   HGBA1C 5.6 11/20/2018    Review of Glycemic Control Results for GEONNA, LOCKYER (MRN [id number]) as of 07/22/19 10:03  Ref. Range 06/23/2019 20:28 Jul 22, 2019 04:39 2019-07-22 07:23  Glucose-Capillary Latest Ref Range: 70 - 99 mg/dL 316 (H) 186 (H) 215 (H)   Diabetes history: Type 2 Dm Outpatient Diabetes medications: Lantus 15 units QHS, Novolog 0-9 units TID Current orders for Inpatient glycemic control: Novolog 0-20 units Q4H, Novolog 10 units Q4H, Levemir 40 units BID Decadron 6 mg QD  Inpatient Diabetes Program Recommendations:    Consider increasing Levemir 45 units BID.   Thanks, Bronson Curb, MSN, RNC-OB Diabetes Coordinator [phone] (8a-5p)

## 2019-07-08 NOTE — Significant Event (Signed)
Critical care attending progress note/CODE STATUS documentation  Spoke to patient's designated substitute decision maker.  We discussed the patient's prognosis.  I indicated to her that the patient did have a chance of recovering from this illness so long as it remained limited to a single organ system.  Nevertheless I was clear with her that her recovery would take many weeks to months and she would also need not to suffer any other complications.  Her friend indicated that the patient's premorbid quality of life was poor but she was not happy living in her skilled nursing facility and that she had already lost many family members and was already lonely.  She had previously stated she did not want to be on life support.  I clarified with her substitute decision-maker that the best we could hope to achieve would be to bring her back to her already poor premorbid quality of life which we agreed was unacceptable to her.  We further agreed that a transition to comfort care and allowing a natural, pain-free and dignified passing would be in keeping with the patient's wishes.  I offered a prewithdrawal of care video visit which was declined.  We will contact her again once the patient has passed.  Status changed to comfort care, DNR.  Kipp Brood, MD [facility] ICU Physician [place]  Pager: [phone] Mobile: [phone] After hours: [phone].  Jul 08, 2019, 6:03 PM

## 2019-07-08 DEATH — deceased

## 2019-07-11 IMAGING — DX PORTABLE CHEST - 1 VIEW
1 series · 1 of 1 positions shown · non-contrast
Comparison: Portable exam 3000 hours compared to 09/10/2018

CLINICAL DATA: Shortness of breath, former smoker, asthma, COPD,
diabetes mellitus, hypertension, screening for infectious disease

EXAM:
PORTABLE CHEST 1 VIEW

[chest ap]
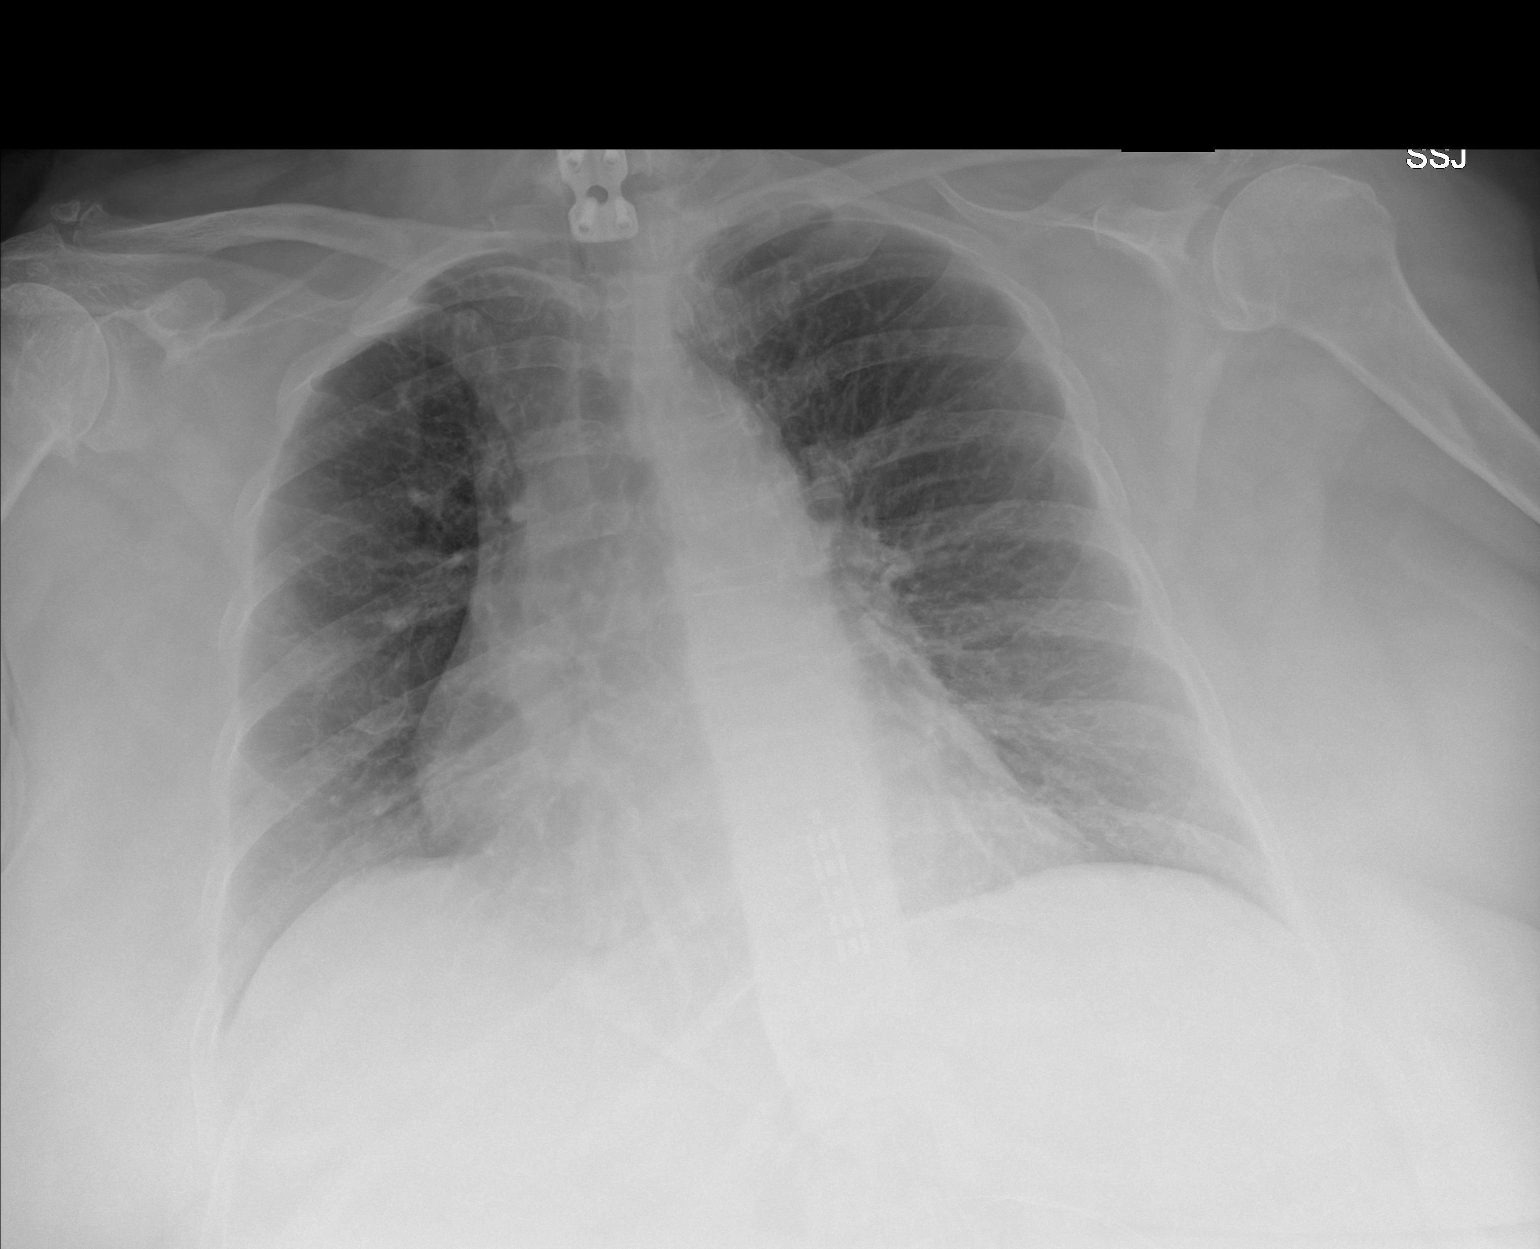

[1 of 1 positions shown; findings below may reference images not displayed]

FINDINGS: Lordotic positioning with slight rotation to the RIGHT.

Upper normal heart size.

Mediastinal contours and pulmonary vascularity normal.

Lungs clear.

No pleural effusion or pneumothorax.

Bones demineralized with evidence of prior thoracolumbar fusion.

Intraspinal stimulator noted.
IMPRESSION: No acute abnormalities.

## 2019-07-12 ENCOUNTER — Encounter (INDEPENDENT_AMBULATORY_CARE_PROVIDER_SITE_OTHER): Payer: Self-pay

## 2019-07-12 ENCOUNTER — Ambulatory Visit (INDEPENDENT_AMBULATORY_CARE_PROVIDER_SITE_OTHER): Payer: Medicare Other | Admitting: Vascular Surgery

## 2019-07-15 ENCOUNTER — Ambulatory Visit: Payer: Medicare Other | Admitting: Hematology and Oncology

## 2019-07-15 ENCOUNTER — Other Ambulatory Visit: Payer: Medicare Other

## 2019-07-30 ENCOUNTER — Ambulatory Visit: Payer: Medicare Other | Admitting: Thoracic Surgery (Cardiothoracic Vascular Surgery)

## 2019-08-30 IMAGING — US ULTRASOUND ABDOMEN COMPLETE
1 series · 14 of 25 positions shown · non-contrast
Comparison: July 11, 2018

CLINICAL DATA: Cirrhosis, thrombocytopenia, splenomegaly.

EXAM:
ABDOMEN ULTRASOUND COMPLETE

[Series 1: ultrasound abdomen complete · 0.27mm/px · 14 of 70 slices shown]
[im 1/70]
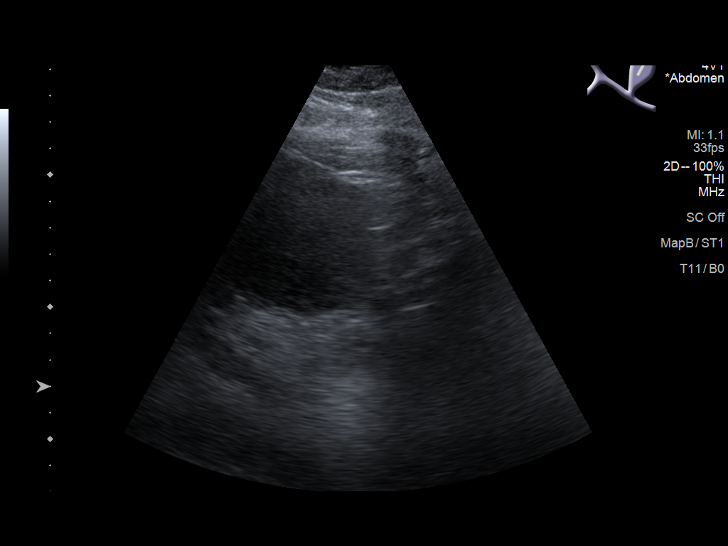
[im 6/70]
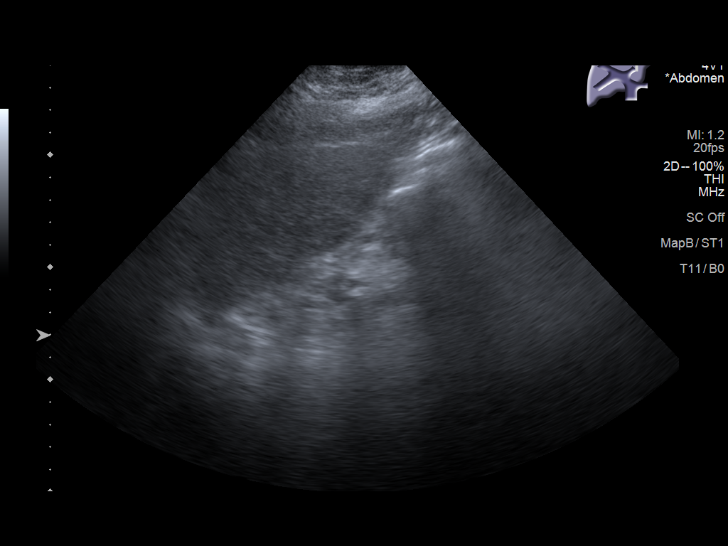
[im 12/70]
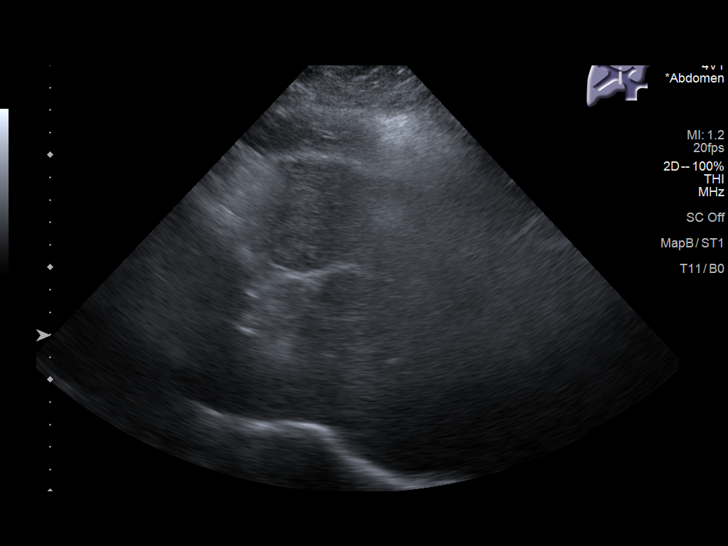
[im 18/70]
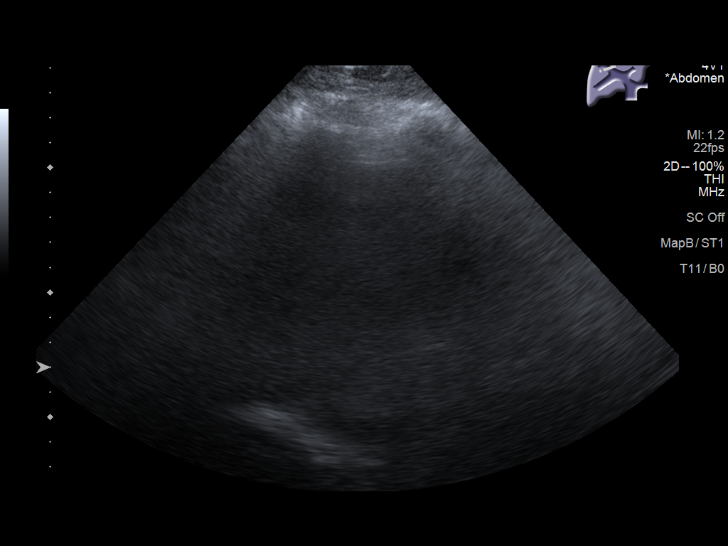
[im 24/70]
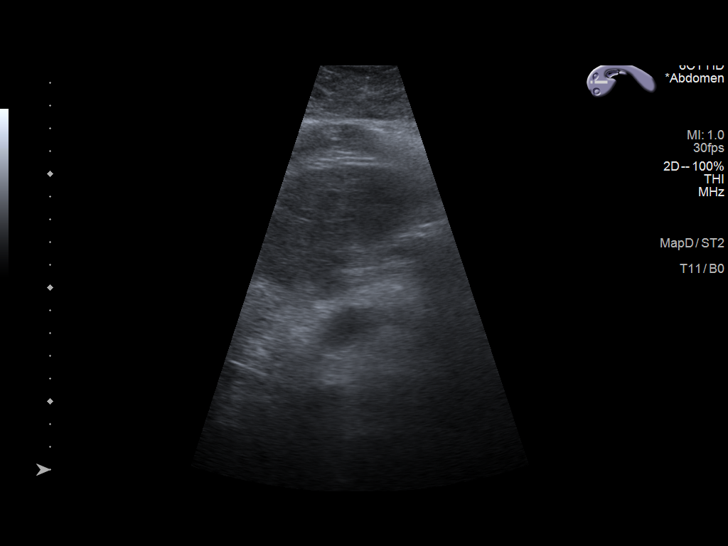
[im 26/70]
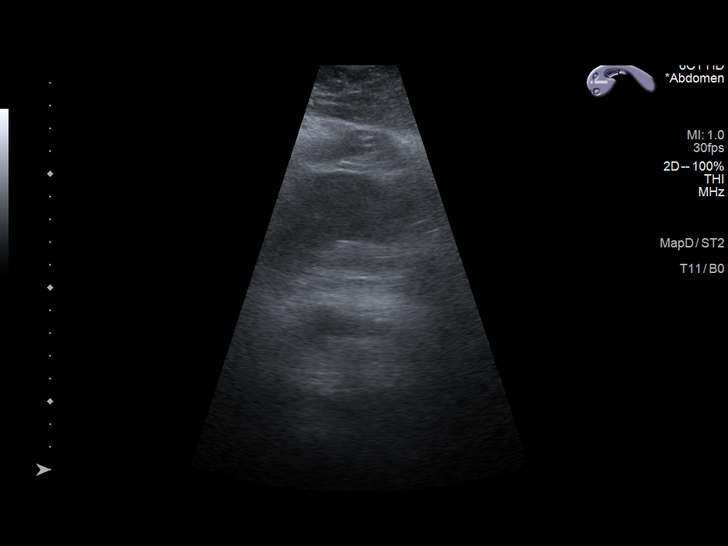
[im 32/70]
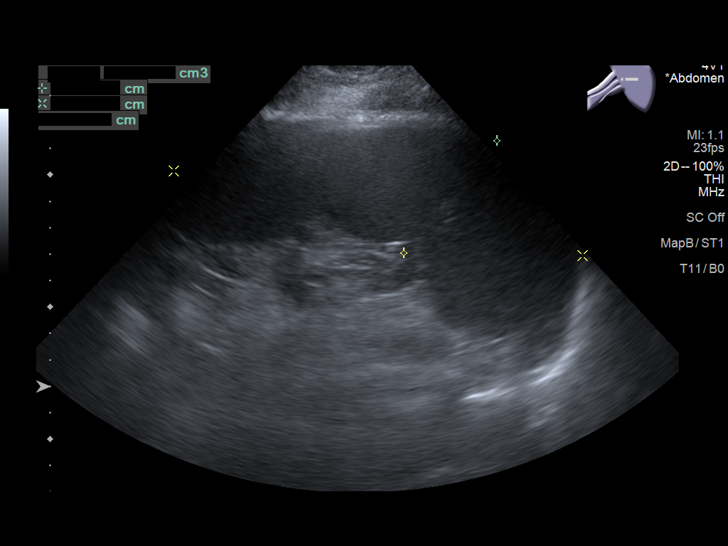
[im 38/70]
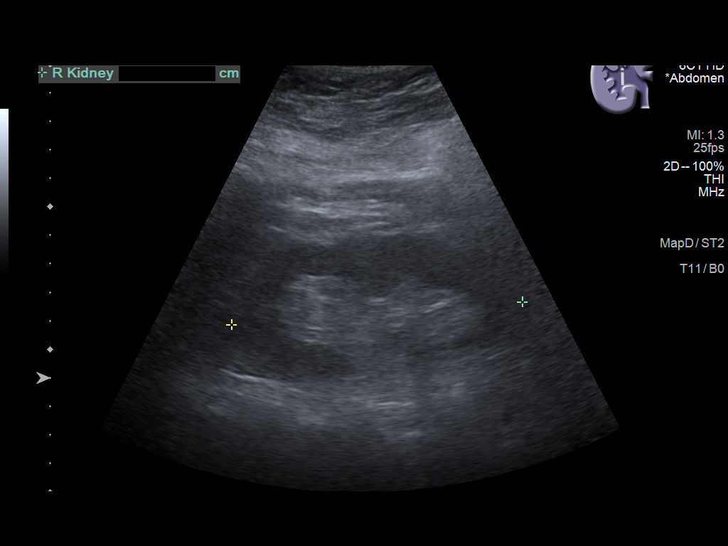
[im 44/70]
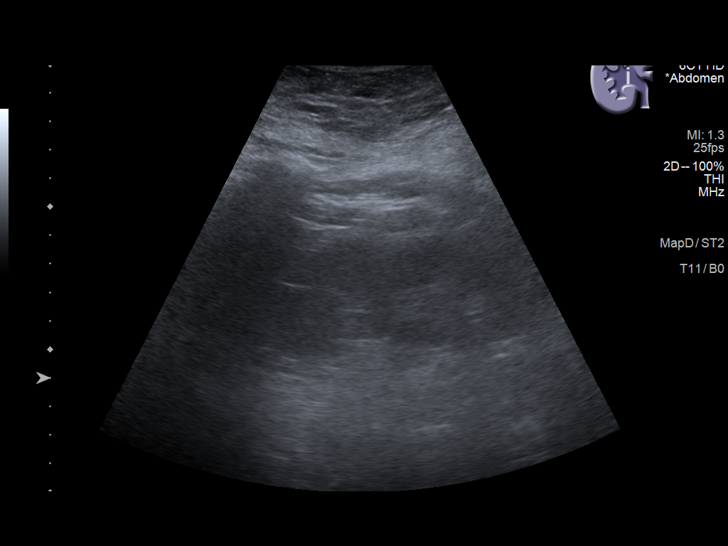
[im 47/70]
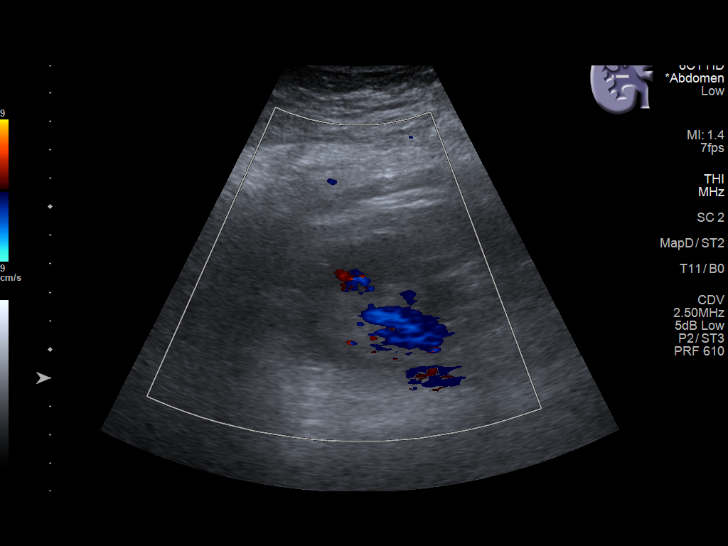
[im 52/70]
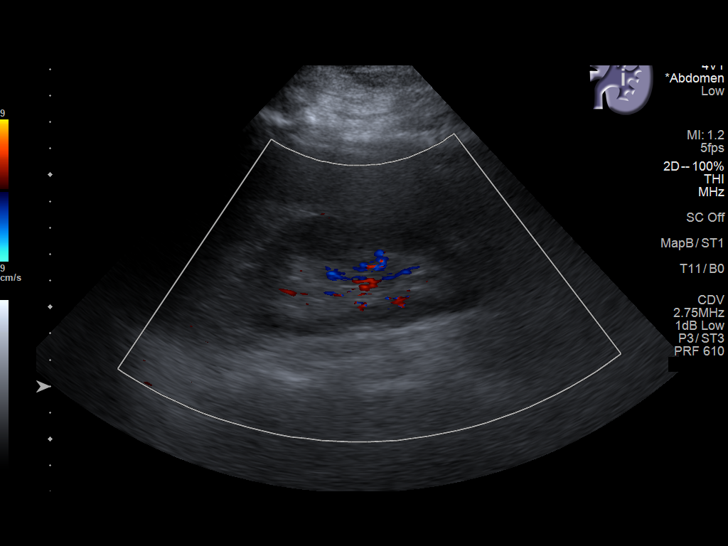
[im 58/70]
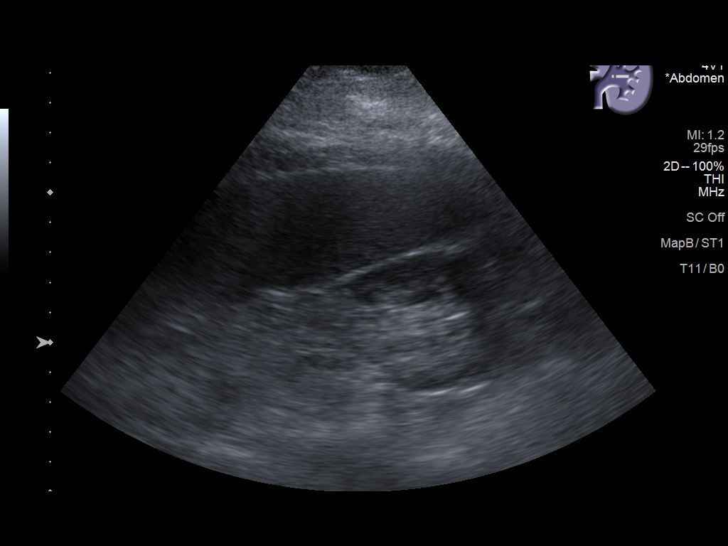
[im 64/70]
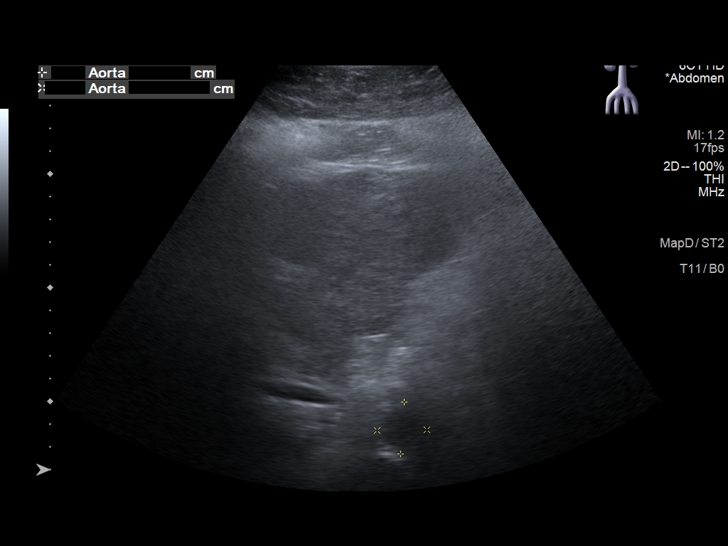
[im 70/70]
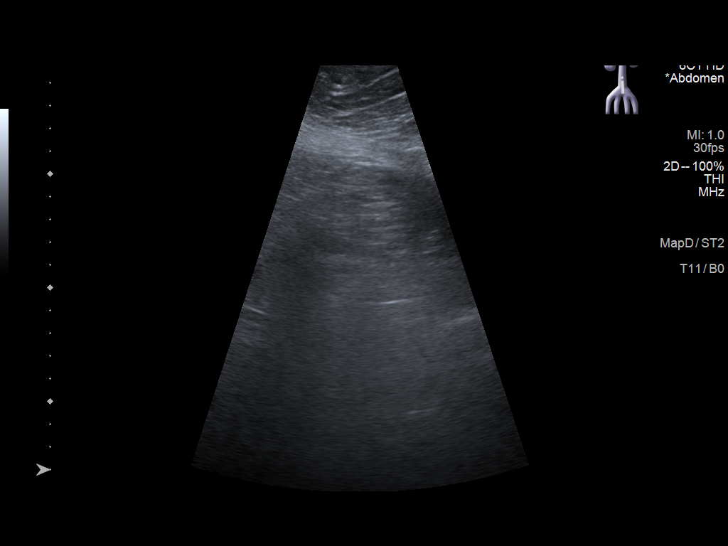

[14 of 25 positions shown; findings below may reference images not displayed]

FINDINGS: Gallbladder: Surgically absent gallbladder.

Common bile duct: Diameter: 5 mm

Liver: Nodular contour with increased heterogeneous echogenicity. No
focal lesions seen, given the limitations of this study. Portal vein
is patent on color Doppler imaging with normal direction of blood
flow towards the liver.

IVC: No abnormality visualized.

Pancreas: Visualized portion unremarkable.

Spleen: Splenomegaly. The spleen measures 15.1 cm in length. Volumes
689 cubic cm.

Right Kidney: Length: 10.2 cm. Echogenicity within normal limits. No
mass or hydronephrosis visualized.

Left Kidney: Length: 10.2 cm. Echogenicity within normal limits. No
mass or hydronephrosis visualized.

Abdominal aorta: No aneurysm visualized.

Other findings: None.
IMPRESSION: Study limited due to body habitus.

Cirrhotic appearance of the liver. No focal masses seen. Patent
portal vein.

Splenomegaly.

## 2020-01-30 IMAGING — DX DG CHEST 1V PORT
1 series · 1 of 1 positions shown · non-contrast
Comparison: November 20, 2018

CLINICAL DATA: Shortness of breath.  [id number] positive

EXAM:
PORTABLE CHEST 1 VIEW

[chest ap]
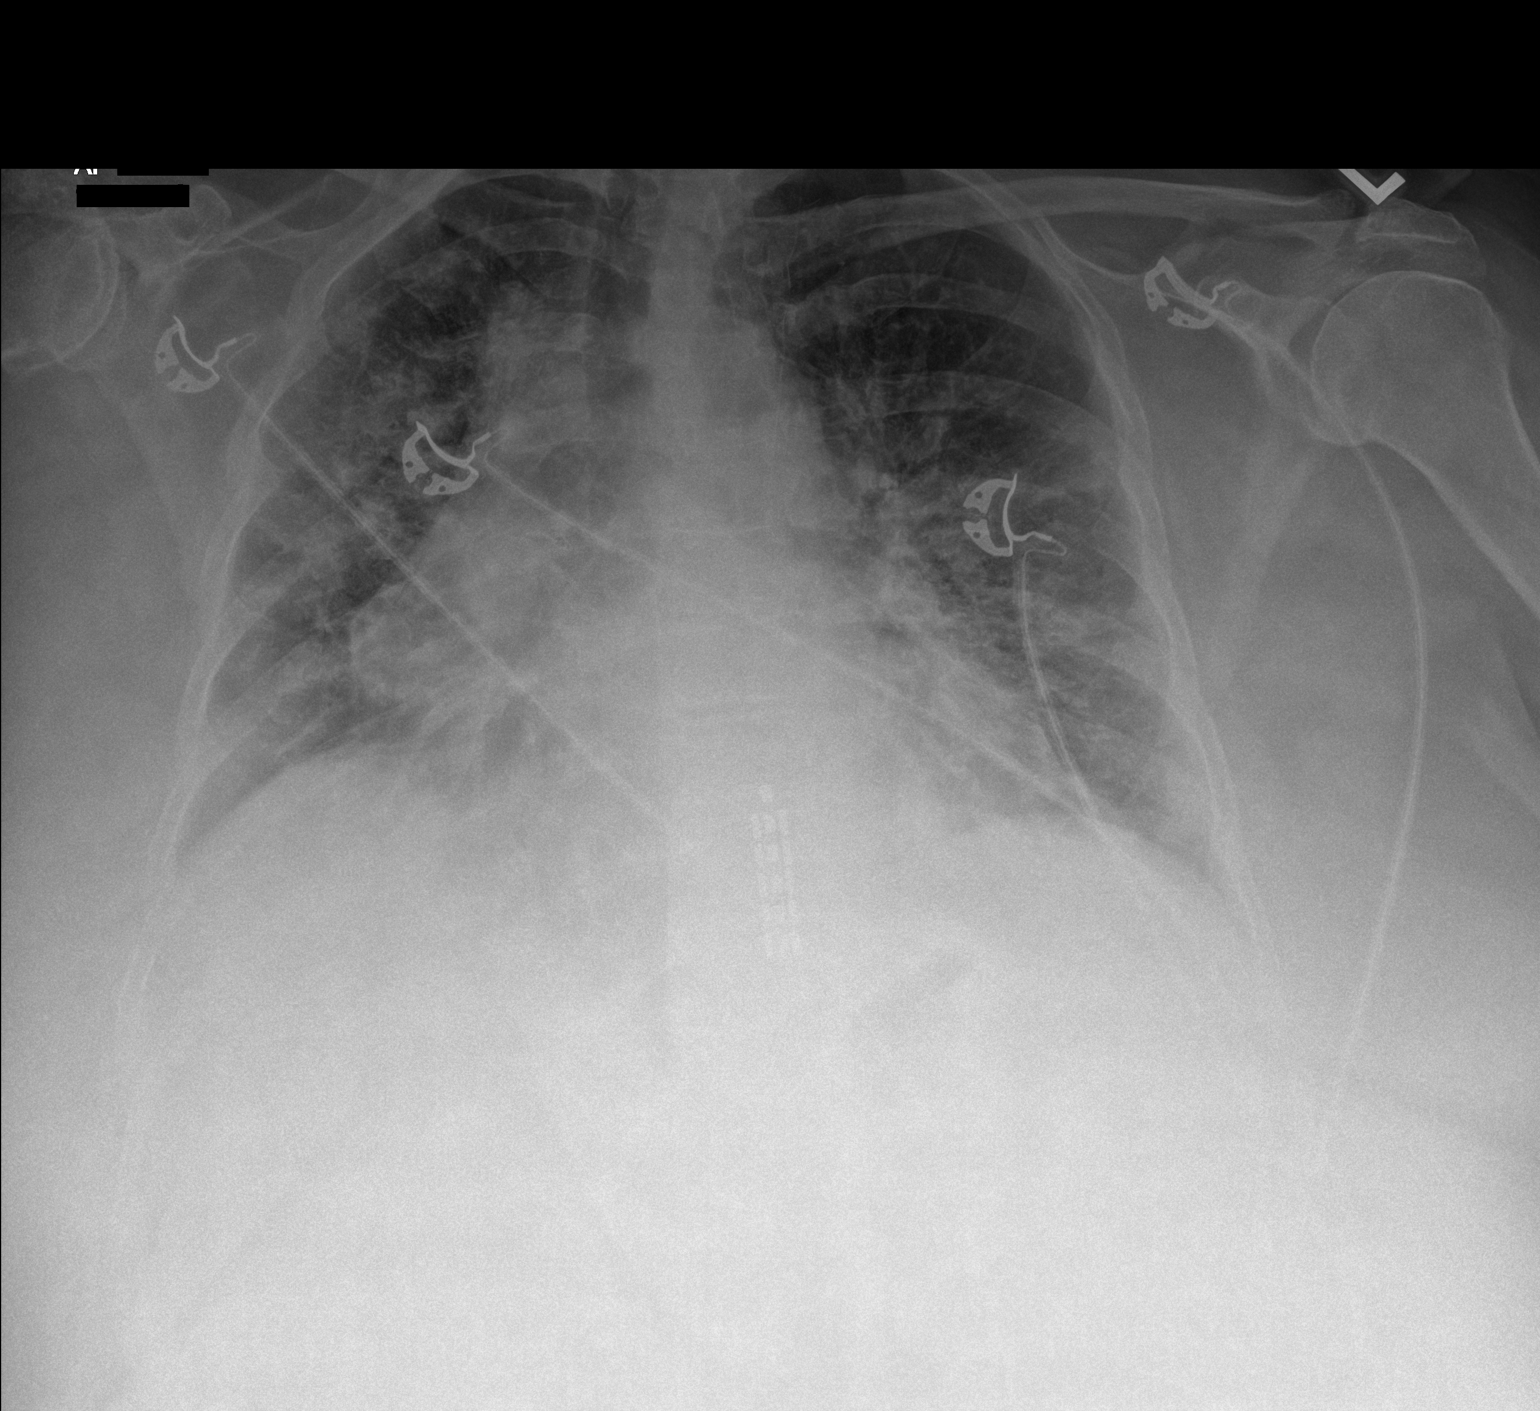

[1 of 1 positions shown; findings below may reference images not displayed]

FINDINGS: There is airspace consolidation in both mid and lower lung zones as
well as in the right upper lobe. There is cardiomegaly with
pulmonary vascularity normal. No adenopathy. Postoperative change
noted in the lower cervical region. Thoracic stimulator lead tips
are in the lower thoracic region.
IMPRESSION: Multifocal airspace consolidation consistent with multifocal
pneumonia. Stable cardiomegaly. No adenopathy evident.

## 2020-02-02 IMAGING — DX DG ABDOMEN 1V
1 series · 1 of 1 positions shown · non-contrast
Comparison: None.

CLINICAL DATA: Check gastric catheter placement

EXAM:
ABDOMEN - 1 VIEW

[abdomen supine]
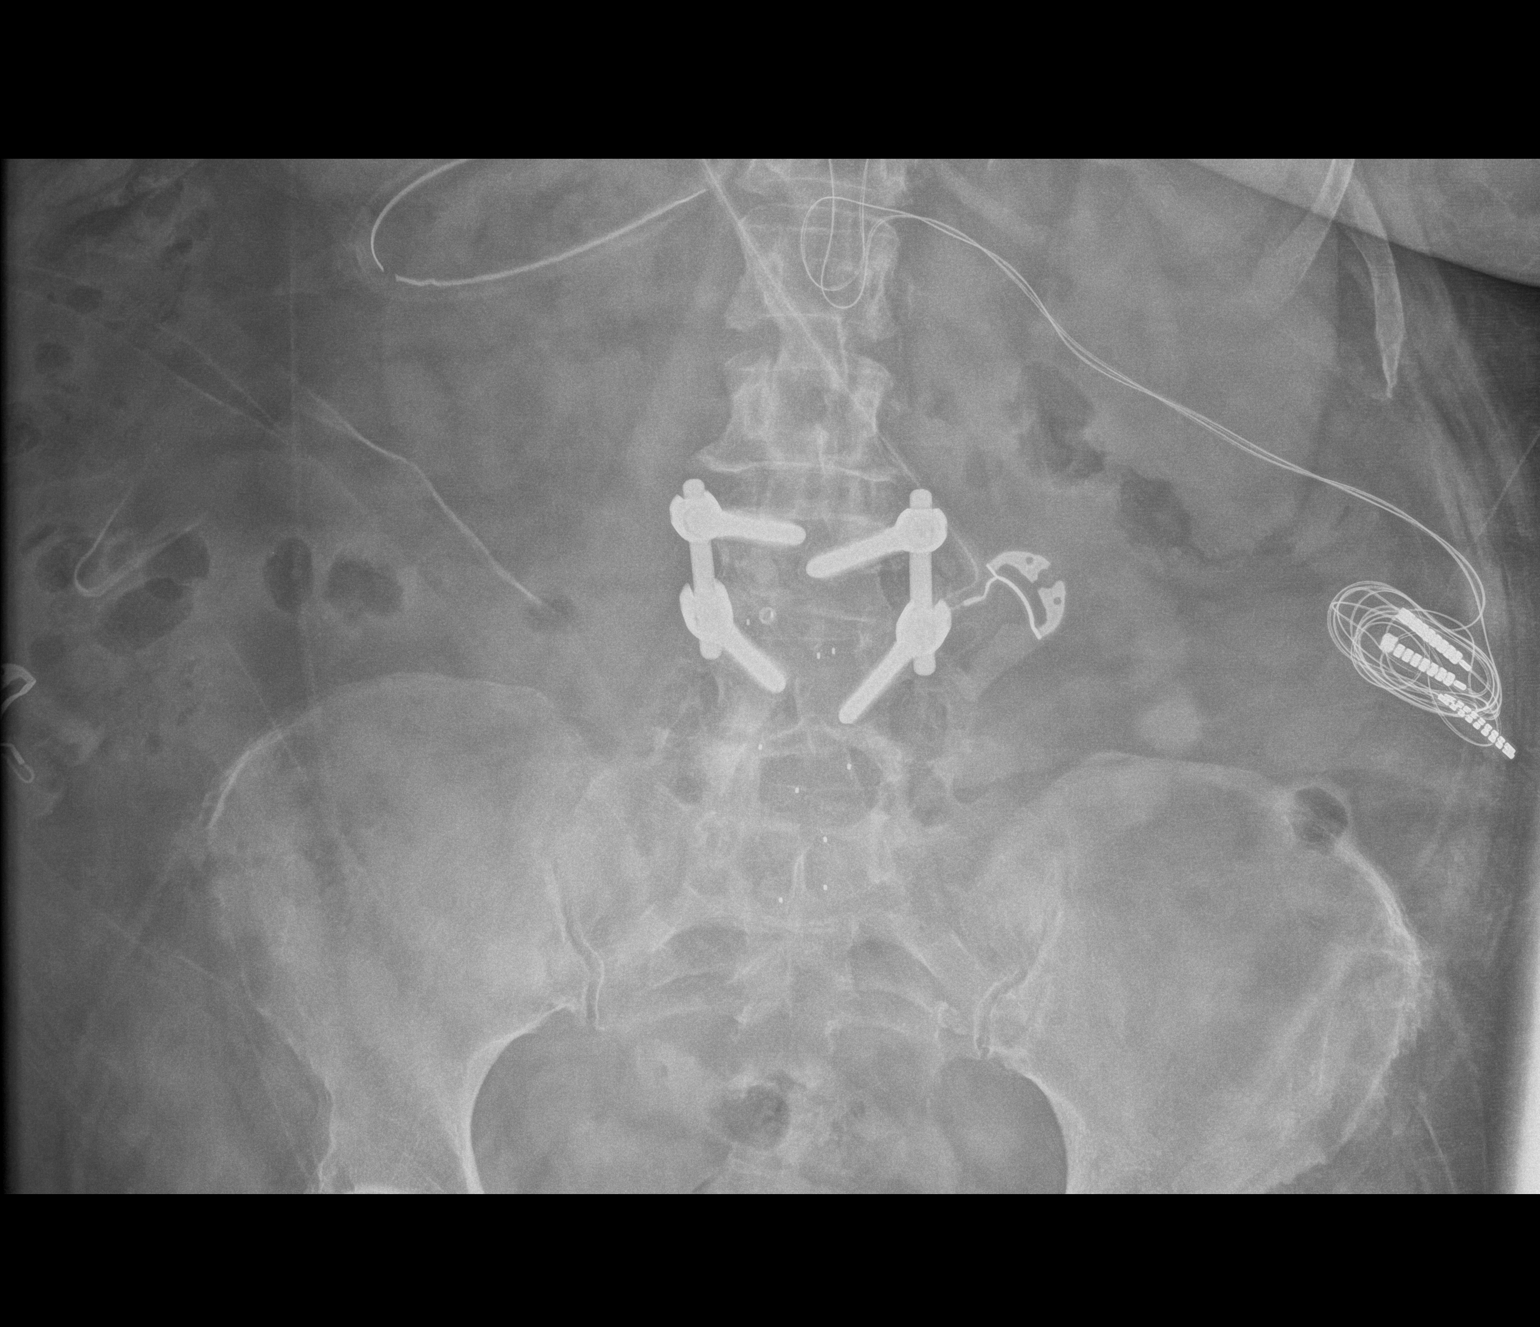

[1 of 1 positions shown; findings below may reference images not displayed]

FINDINGS: Scattered large and small bowel gas is noted. Gastric catheter is
noted coiled within the distal stomach. Postsurgical changes are
seen.
IMPRESSION: Gastric catheter within the stomach.

## 2020-02-02 IMAGING — DX DG CHEST 1V PORT
1 series · 1 of 1 positions shown · non-contrast
Comparison: 06/11/2019

CLINICAL DATA: Status post intubation

EXAM:
PORTABLE CHEST 1 VIEW

[chest ap]
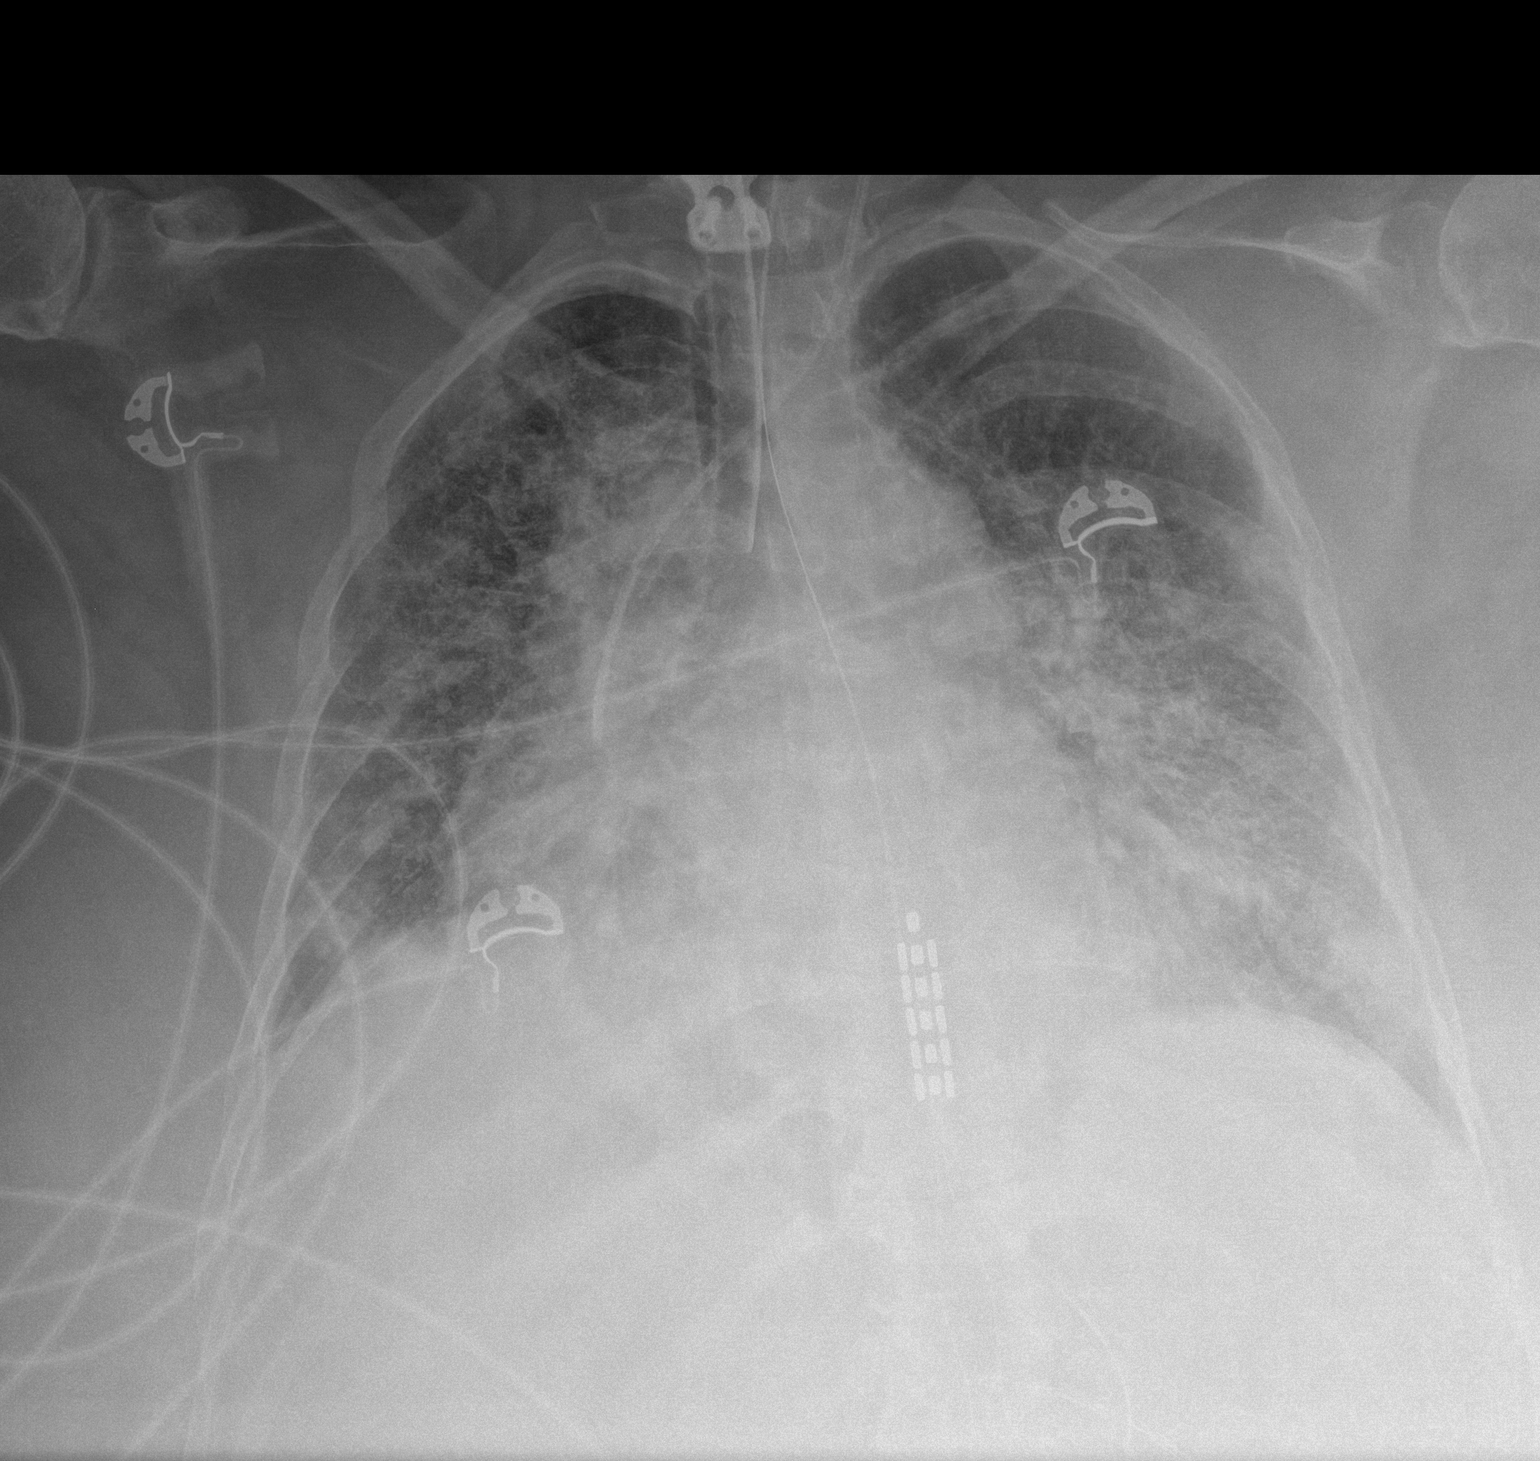

[1 of 1 positions shown; findings below may reference images not displayed]

FINDINGS: Cardiac shadow is enlarged but stable. Endotracheal tube is noted 6
mm above the carina. This should be withdrawn 1-2 cm. Gastric
catheter is noted within the stomach. Left jugular central line is
seen at the cavoatrial junction. Scattered parenchymal opacities are
increased when compared with the prior exam. These are consistent
with the given clinical history of [id number] positivity. No other
focal abnormality is seen.
IMPRESSION: Status post intubation. The endotracheal tube is at the level of the
carina and should be withdrawn 1-2 cm.

Scattered parenchymal opacities increased from the prior exam
consistent with the given clinical history.

Gastric catheter and left jugular line in satisfactory position.

## 2020-02-03 IMAGING — DX DG CHEST 1V
1 series · 1 of 1 positions shown · non-contrast
Comparison: 06/14/2019

CLINICAL DATA: COVID pneumonia, intubated

EXAM:
CHEST  1 VIEW

[chest ap]
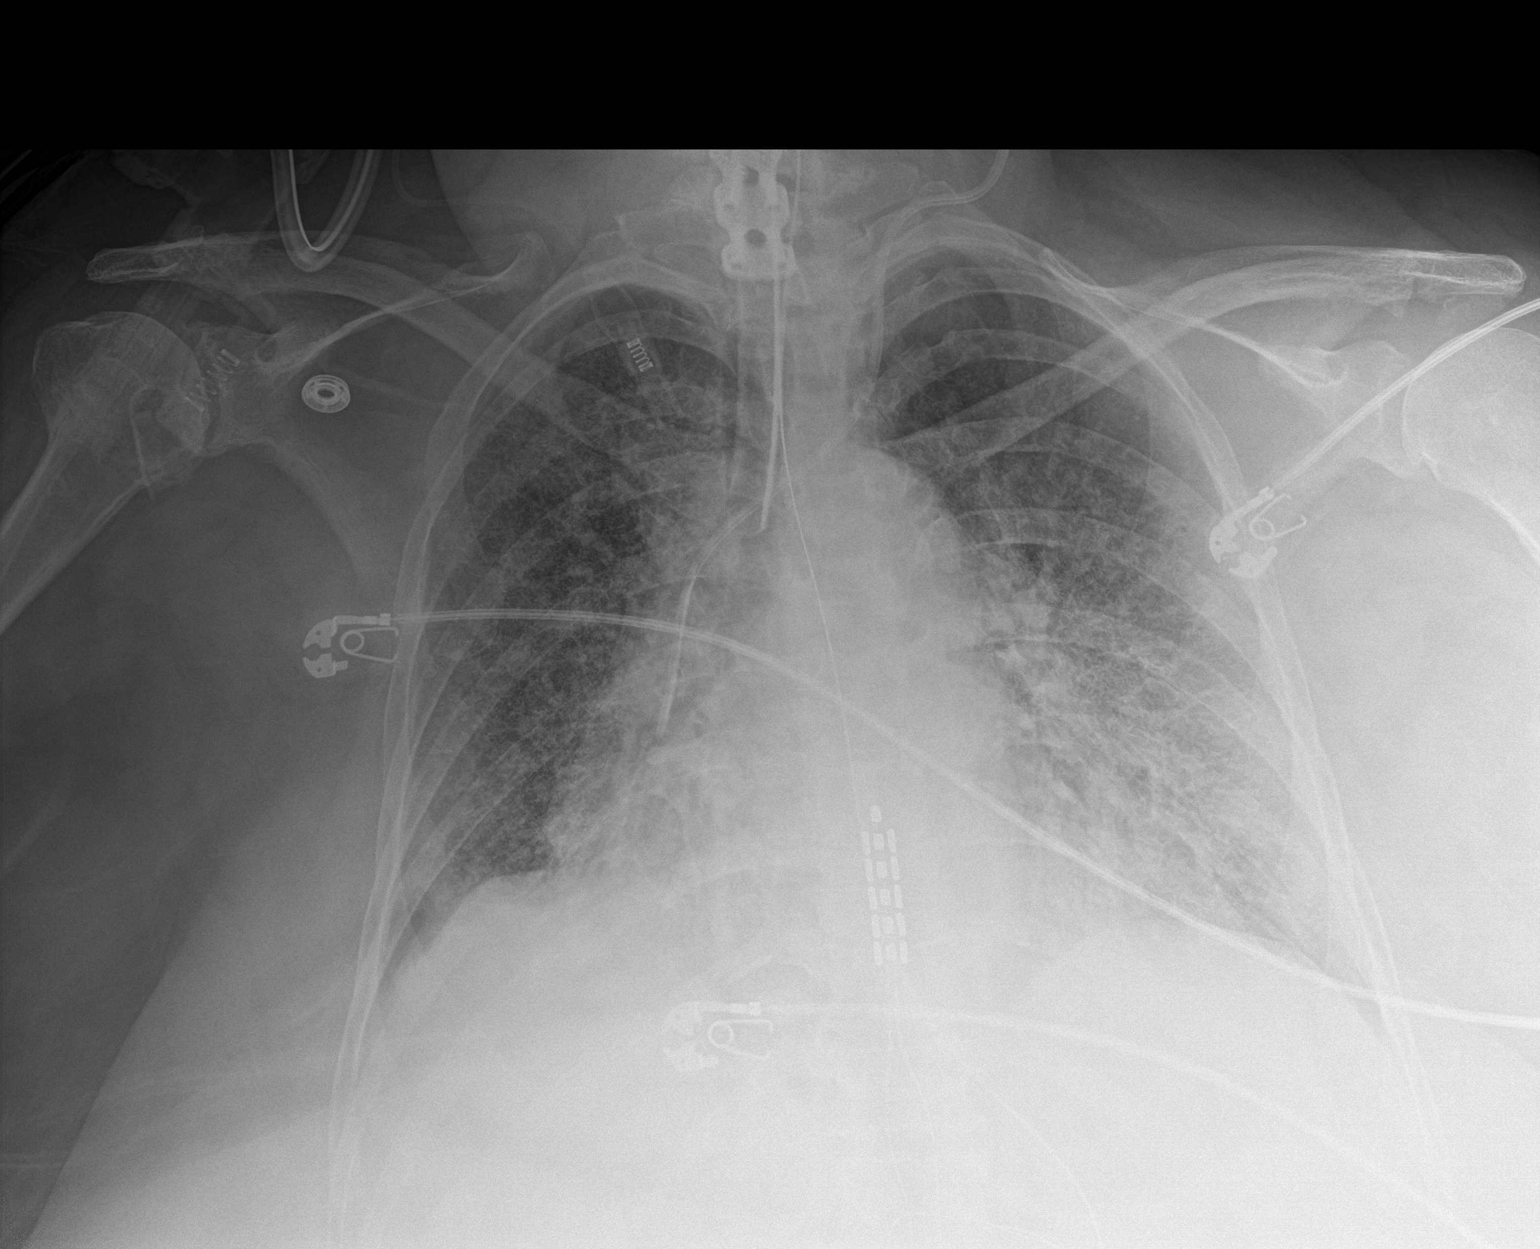

[1 of 1 positions shown; findings below may reference images not displayed]

FINDINGS: Endotracheal tube 1.6 cm above the carina. NG tube enters the
stomach tip. Left IJ central tip SVC RA junction as before. Thoracic
stimulator noted.

Minimal improvement in the diffuse bilateral interstitial opacities
compatible with atypical/viral pneumonia. Heart is enlarged.
Difficult to exclude superimposed edema. No large effusion or
pneumothorax. Lower cervical fusion also evident.
IMPRESSION: Stable cardiomegaly.  Stable support apparatus.

Slight improvement in diffuse interstitial opacities compatible with
atypical/viral pneumonia versus edema.

## 2020-02-06 IMAGING — DX DG CHEST 1V PORT
1 series · 1 of 1 positions shown · non-contrast
Comparison: Three days ago

CLINICAL DATA: Acute respiratory failure with hypoxia

EXAM:
PORTABLE CHEST 1 VIEW

[chest ap]
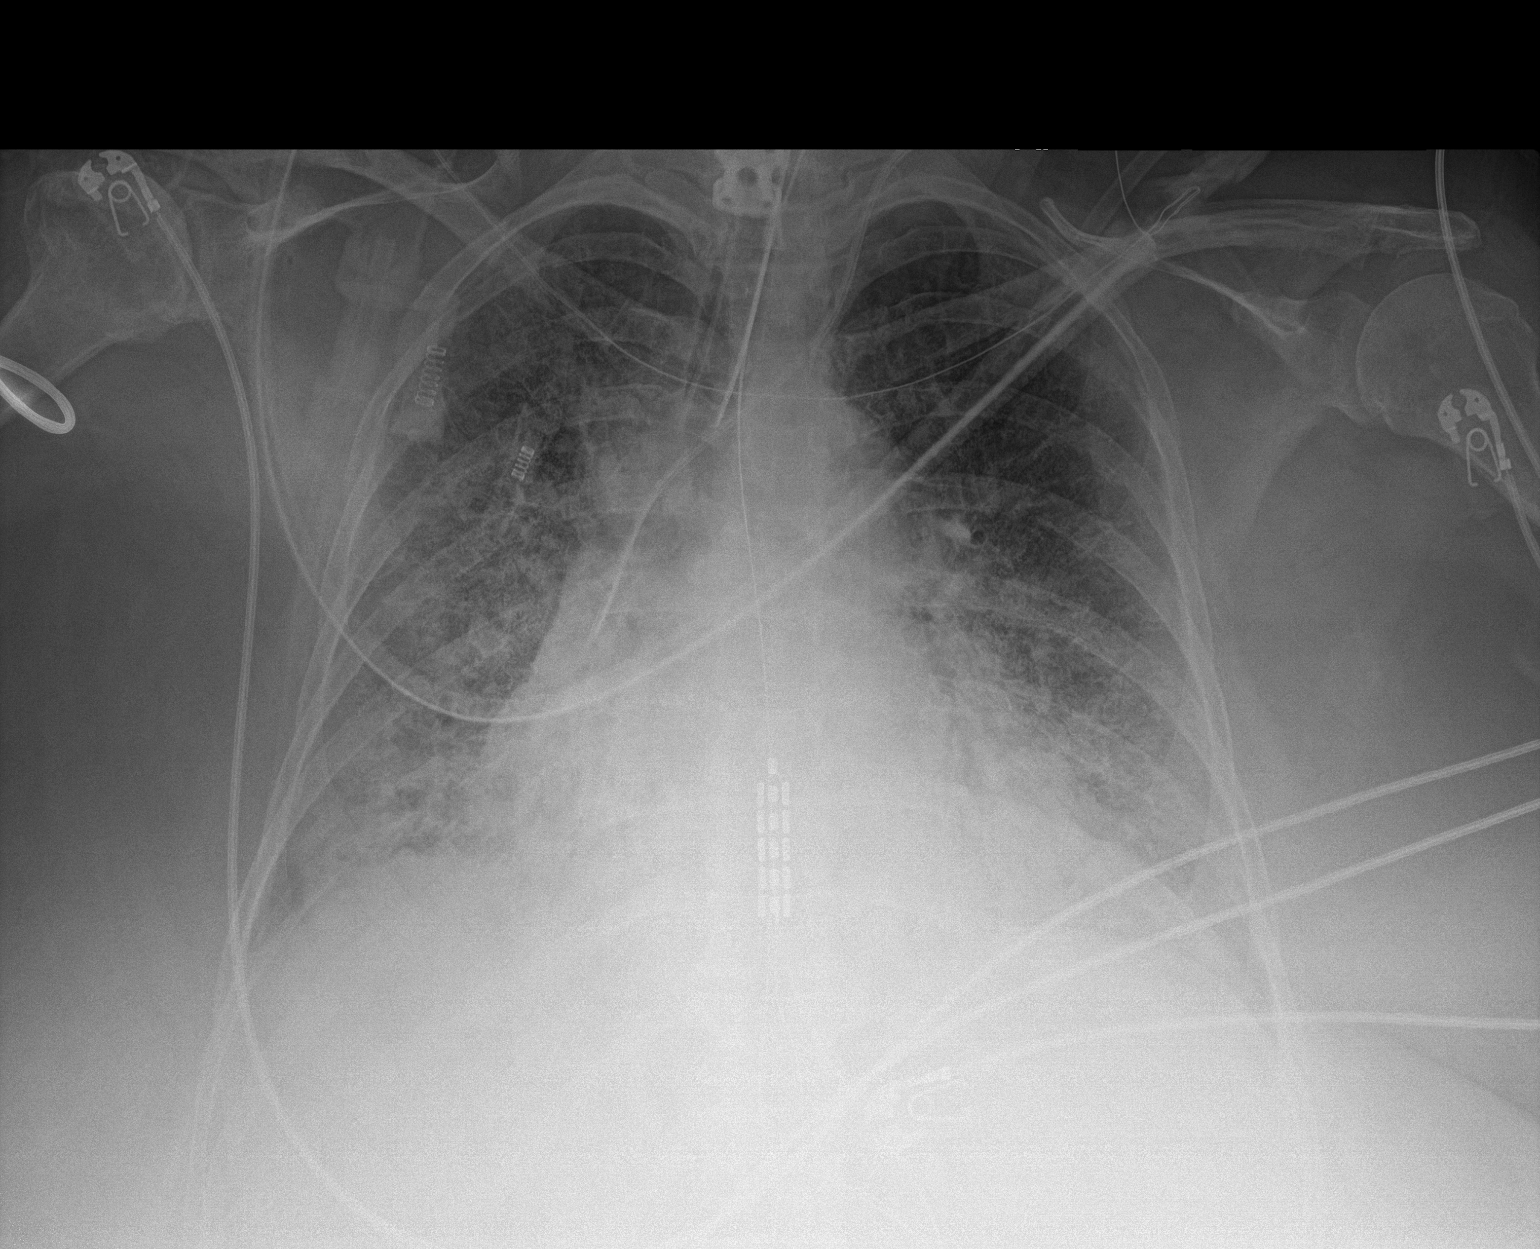

[1 of 1 positions shown; findings below may reference images not displayed]

FINDINGS: Endotracheal tube tip is just below the clavicular heads. Left IJ
line with tip at the SVC. Enteric tube is difficult to visualize due
to soft tissue attenuation but likely reaches the stomach.
Generalized interstitial and airspace infiltrates with mild
worsening at the right base. No effusion or pneumothorax.
Cardiomegaly.
IMPRESSION: 1. Stable hardware positioning.
2. Extensive pulmonary opacification with mild worsening at the
right base.

## 2020-02-09 IMAGING — DX DG CHEST 1V PORT
1 series · 1 of 1 positions shown · non-contrast
Comparison: 06/18/2019

CLINICAL DATA: Acute respiratory failure with hypoxia.

EXAM:
PORTABLE CHEST 1 VIEW

[chest ap]
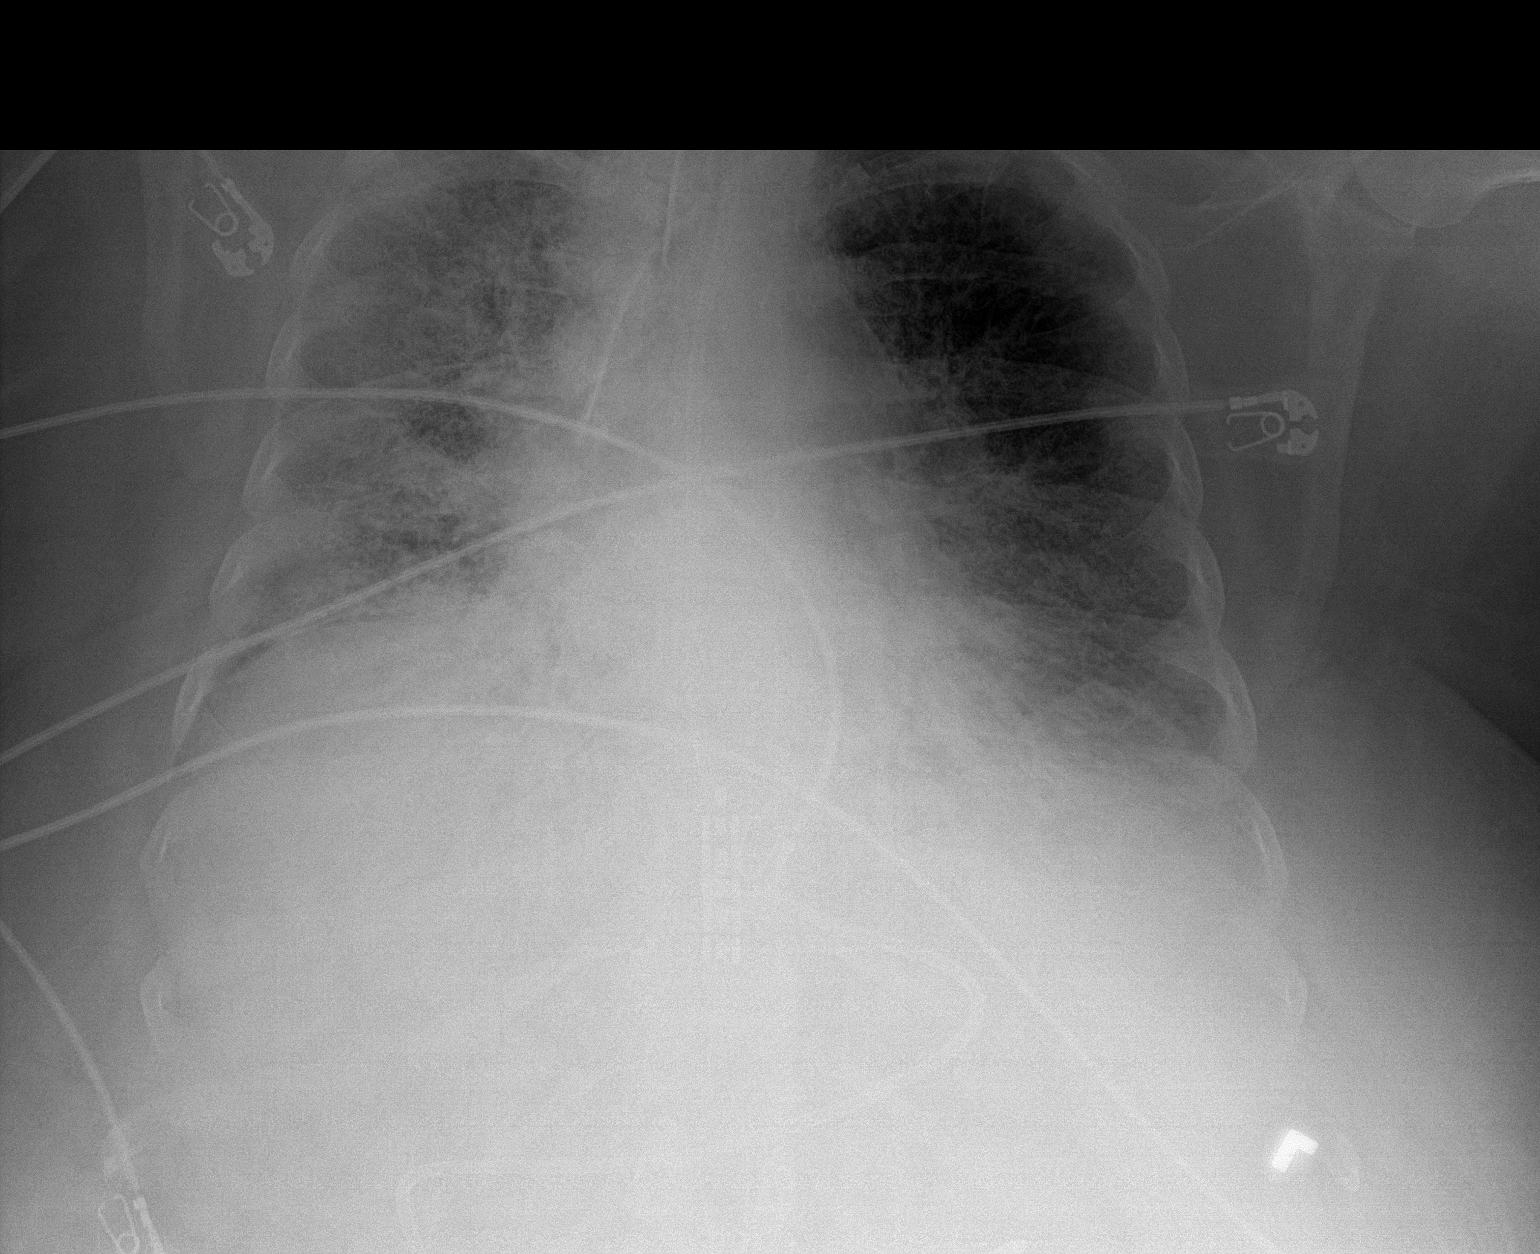

[1 of 1 positions shown; findings below may reference images not displayed]

FINDINGS: Enteric fusion tube courses into the region of the duodenum and off
the film as tip is not visualized. Endotracheal tube and left IJ
central venous catheter are unchanged. Neural stimulator device has
tip at the approximate T10 level unchanged.

Lungs are adequately inflated with moderate bilateral hazy airspace
process with possible slight interval worsening over the right lung
likely multifocal infection versus edema. No evidence of effusion.
Cardiomediastinal silhouette and remainder of the exam is unchanged.
IMPRESSION: 1. Persistent bilateral multifocal hazy airspace process with slight
interval worsening over the right lung likely multifocal infection
and less likely edema.

2.  Tubes and lines as described.

## 2020-02-11 IMAGING — DX DG CHEST 1V PORT
1 series · 1 of 1 positions shown · non-contrast
Comparison: June 21, 2019.

CLINICAL DATA: Acute respiratory failure.

EXAM:
PORTABLE CHEST 1 VIEW

[chest ap]
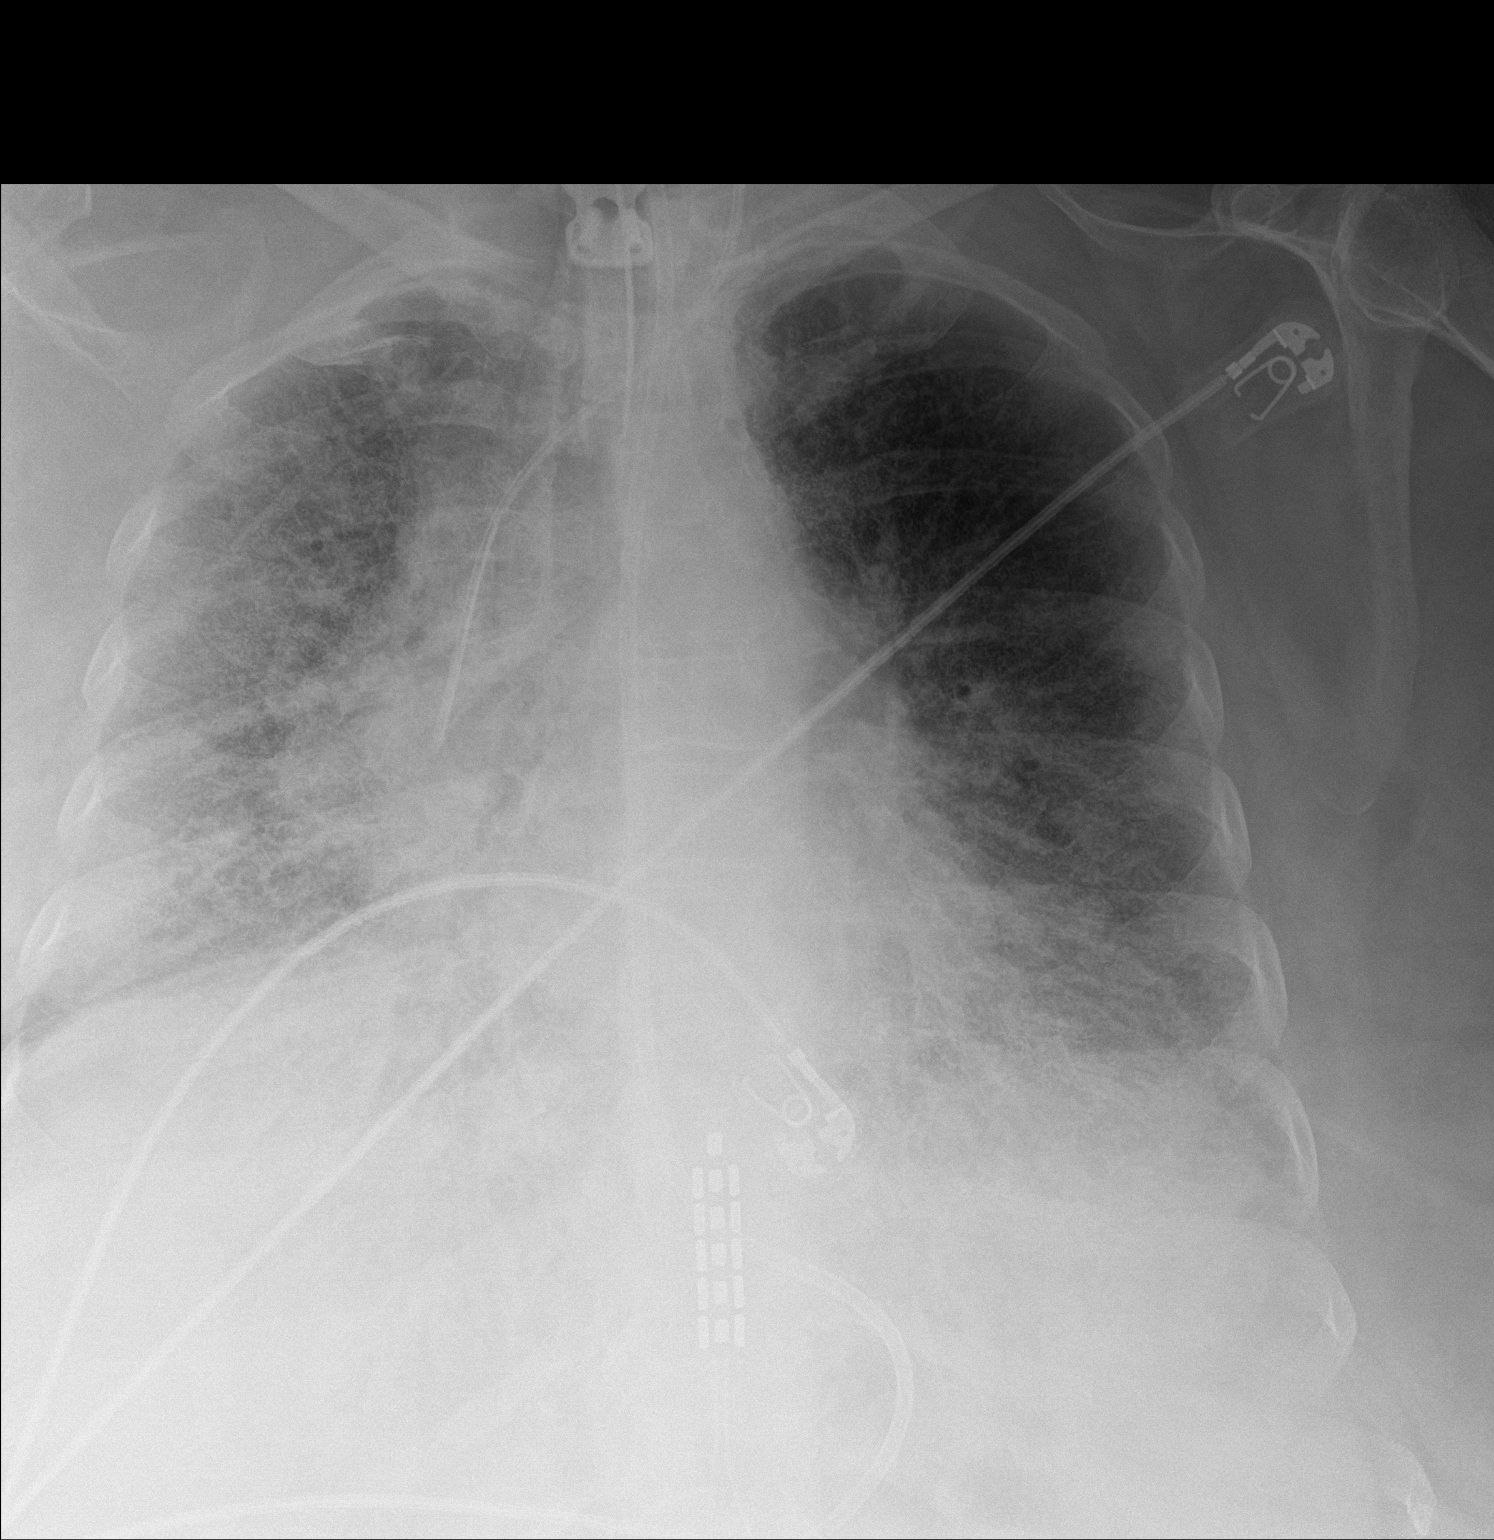

[1 of 1 positions shown; findings below may reference images not displayed]

FINDINGS: Stable cardiomediastinal silhouette. Endotracheal and feeding tubes
are unchanged in position. Left internal jugular catheter is
unchanged. Stable bilateral lung opacities are noted, right greater
than left, consistent with multifocal pneumonia. Bony thorax is
unremarkable.
IMPRESSION: Stable support apparatus. Stable bilateral lung opacities are noted
concerning for multifocal pneumonia.
